# Patient Record
Sex: Female | Born: 1955
Health system: Southern US, Community
[De-identification: ages and names within clinical notes are randomized; demographics above are authoritative.]

## PROBLEM LIST (undated history)

## (undated) DIAGNOSIS — D649 Anemia, unspecified: Secondary | ICD-10-CM

## (undated) DIAGNOSIS — C50919 Malignant neoplasm of unspecified site of unspecified female breast: Secondary | ICD-10-CM

## (undated) DIAGNOSIS — R51 Headache: Secondary | ICD-10-CM

## (undated) DIAGNOSIS — Z8719 Personal history of other diseases of the digestive system: Secondary | ICD-10-CM

## (undated) DIAGNOSIS — G4733 Obstructive sleep apnea (adult) (pediatric): Secondary | ICD-10-CM

## (undated) DIAGNOSIS — D689 Coagulation defect, unspecified: Secondary | ICD-10-CM

## (undated) DIAGNOSIS — T8859XA Other complications of anesthesia, initial encounter: Secondary | ICD-10-CM

## (undated) DIAGNOSIS — E669 Obesity, unspecified: Secondary | ICD-10-CM

## (undated) DIAGNOSIS — Z9289 Personal history of other medical treatment: Secondary | ICD-10-CM

## (undated) DIAGNOSIS — IMO0001 Reserved for inherently not codable concepts without codable children: Secondary | ICD-10-CM

## (undated) DIAGNOSIS — F32A Depression, unspecified: Secondary | ICD-10-CM

## (undated) DIAGNOSIS — K222 Esophageal obstruction: Secondary | ICD-10-CM

## (undated) DIAGNOSIS — M545 Low back pain, unspecified: Secondary | ICD-10-CM

## (undated) DIAGNOSIS — K5790 Diverticulosis of intestine, part unspecified, without perforation or abscess without bleeding: Secondary | ICD-10-CM

## (undated) DIAGNOSIS — J42 Unspecified chronic bronchitis: Secondary | ICD-10-CM

## (undated) DIAGNOSIS — J189 Pneumonia, unspecified organism: Secondary | ICD-10-CM

## (undated) DIAGNOSIS — R519 Headache, unspecified: Secondary | ICD-10-CM

## (undated) DIAGNOSIS — F419 Anxiety disorder, unspecified: Secondary | ICD-10-CM

## (undated) DIAGNOSIS — Z923 Personal history of irradiation: Secondary | ICD-10-CM

## (undated) DIAGNOSIS — T4145XA Adverse effect of unspecified anesthetic, initial encounter: Secondary | ICD-10-CM

## (undated) DIAGNOSIS — K5792 Diverticulitis of intestine, part unspecified, without perforation or abscess without bleeding: Secondary | ICD-10-CM

## (undated) DIAGNOSIS — Z95820 Peripheral vascular angioplasty status with implants and grafts: Secondary | ICD-10-CM

## (undated) DIAGNOSIS — G8929 Other chronic pain: Secondary | ICD-10-CM

## (undated) DIAGNOSIS — F329 Major depressive disorder, single episode, unspecified: Secondary | ICD-10-CM

## (undated) DIAGNOSIS — J320 Chronic maxillary sinusitis: Secondary | ICD-10-CM

## (undated) DIAGNOSIS — K589 Irritable bowel syndrome without diarrhea: Secondary | ICD-10-CM

## (undated) DIAGNOSIS — M1990 Unspecified osteoarthritis, unspecified site: Secondary | ICD-10-CM

## (undated) DIAGNOSIS — R918 Other nonspecific abnormal finding of lung field: Secondary | ICD-10-CM

## (undated) DIAGNOSIS — K219 Gastro-esophageal reflux disease without esophagitis: Secondary | ICD-10-CM

## (undated) DIAGNOSIS — B4481 Allergic bronchopulmonary aspergillosis: Secondary | ICD-10-CM

## (undated) DIAGNOSIS — J449 Chronic obstructive pulmonary disease, unspecified: Secondary | ICD-10-CM

## (undated) DIAGNOSIS — I2510 Atherosclerotic heart disease of native coronary artery without angina pectoris: Secondary | ICD-10-CM

## (undated) DIAGNOSIS — R739 Hyperglycemia, unspecified: Secondary | ICD-10-CM

## (undated) DIAGNOSIS — E782 Mixed hyperlipidemia: Secondary | ICD-10-CM

## (undated) HISTORY — DX: Obstructive sleep apnea (adult) (pediatric): G47.33

## (undated) HISTORY — DX: Personal history of other medical treatment: Z92.89

## (undated) HISTORY — DX: Diverticulosis of intestine, part unspecified, without perforation or abscess without bleeding: K57.90

## (undated) HISTORY — DX: Diverticulitis of intestine, part unspecified, without perforation or abscess without bleeding: K57.92

## (undated) HISTORY — DX: Depression, unspecified: F32.A

## (undated) HISTORY — DX: Other nonspecific abnormal finding of lung field: R91.8

## (undated) HISTORY — DX: Irritable bowel syndrome, unspecified: K58.9

## (undated) HISTORY — DX: Malignant neoplasm of unspecified site of unspecified female breast: C50.919

## (undated) HISTORY — DX: Hyperglycemia, unspecified: R73.9

## (undated) HISTORY — DX: Atherosclerotic heart disease of native coronary artery without angina pectoris: I25.10

## (undated) HISTORY — DX: Obesity, unspecified: E66.9

## (undated) HISTORY — DX: Chronic obstructive pulmonary disease, unspecified: J44.9

## (undated) HISTORY — DX: Unspecified osteoarthritis, unspecified site: M19.90

## (undated) HISTORY — DX: Esophageal obstruction: K22.2

## (undated) HISTORY — DX: Coagulation defect, unspecified: D68.9

## (undated) HISTORY — DX: Chronic maxillary sinusitis: J32.0

## (undated) HISTORY — DX: Major depressive disorder, single episode, unspecified: F32.9

## (undated) HISTORY — DX: Mixed hyperlipidemia: E78.2

---

## 1987-06-21 HISTORY — PX: APPENDECTOMY: SHX54

## 2002-11-27 ENCOUNTER — Encounter: Payer: Self-pay | Admitting: Pediatrics

## 2002-11-27 ENCOUNTER — Encounter: Admission: RE | Admit: 2002-11-27 | Discharge: 2002-11-27 | Payer: Self-pay | Admitting: Pediatrics

## 2003-05-27 ENCOUNTER — Other Ambulatory Visit: Admission: RE | Admit: 2003-05-27 | Discharge: 2003-05-27 | Payer: Self-pay | Admitting: Obstetrics and Gynecology

## 2004-03-15 ENCOUNTER — Emergency Department (HOSPITAL_COMMUNITY): Admission: EM | Admit: 2004-03-15 | Discharge: 2004-03-15 | Payer: Self-pay | Admitting: Emergency Medicine

## 2004-03-19 ENCOUNTER — Encounter: Admission: RE | Admit: 2004-03-19 | Discharge: 2004-03-19 | Payer: Self-pay | Admitting: Family Medicine

## 2004-05-04 ENCOUNTER — Ambulatory Visit: Payer: Self-pay | Admitting: Family Medicine

## 2004-09-02 ENCOUNTER — Other Ambulatory Visit: Admission: RE | Admit: 2004-09-02 | Discharge: 2004-09-02 | Payer: Self-pay | Admitting: Obstetrics and Gynecology

## 2005-04-15 ENCOUNTER — Emergency Department (HOSPITAL_COMMUNITY): Admission: EM | Admit: 2005-04-15 | Discharge: 2005-04-16 | Payer: Self-pay | Admitting: Family Medicine

## 2005-04-19 ENCOUNTER — Ambulatory Visit: Payer: Self-pay | Admitting: Family Medicine

## 2005-04-27 ENCOUNTER — Encounter: Payer: Self-pay | Admitting: Internal Medicine

## 2005-04-27 ENCOUNTER — Ambulatory Visit: Payer: Self-pay | Admitting: Internal Medicine

## 2005-04-28 ENCOUNTER — Ambulatory Visit: Payer: Self-pay | Admitting: Family Medicine

## 2005-05-03 ENCOUNTER — Ambulatory Visit (HOSPITAL_COMMUNITY): Admission: RE | Admit: 2005-05-03 | Discharge: 2005-05-03 | Payer: Self-pay | Admitting: Critical Care Medicine

## 2005-07-27 ENCOUNTER — Ambulatory Visit: Payer: Self-pay | Admitting: Family Medicine

## 2005-09-14 ENCOUNTER — Ambulatory Visit: Payer: Self-pay | Admitting: Cardiology

## 2005-09-28 ENCOUNTER — Ambulatory Visit: Payer: Self-pay | Admitting: Critical Care Medicine

## 2005-10-14 ENCOUNTER — Ambulatory Visit: Payer: Self-pay | Admitting: Internal Medicine

## 2005-10-28 ENCOUNTER — Ambulatory Visit: Payer: Self-pay | Admitting: Critical Care Medicine

## 2005-10-31 ENCOUNTER — Ambulatory Visit: Payer: Self-pay | Admitting: Cardiology

## 2005-12-06 ENCOUNTER — Encounter: Payer: Self-pay | Admitting: Internal Medicine

## 2006-03-15 ENCOUNTER — Ambulatory Visit (HOSPITAL_BASED_OUTPATIENT_CLINIC_OR_DEPARTMENT_OTHER): Admission: RE | Admit: 2006-03-15 | Discharge: 2006-03-15 | Payer: Self-pay | Admitting: Otolaryngology

## 2006-03-15 ENCOUNTER — Encounter: Payer: Self-pay | Admitting: Internal Medicine

## 2006-03-21 ENCOUNTER — Ambulatory Visit: Payer: Self-pay | Admitting: Critical Care Medicine

## 2006-03-26 ENCOUNTER — Ambulatory Visit: Payer: Self-pay | Admitting: Internal Medicine

## 2006-05-13 ENCOUNTER — Ambulatory Visit: Payer: Self-pay | Admitting: Family Medicine

## 2006-05-29 ENCOUNTER — Ambulatory Visit: Payer: Self-pay | Admitting: Family Medicine

## 2006-06-20 DIAGNOSIS — B4481 Allergic bronchopulmonary aspergillosis: Secondary | ICD-10-CM

## 2006-06-20 HISTORY — DX: Allergic bronchopulmonary aspergillosis: B44.81

## 2006-08-21 ENCOUNTER — Ambulatory Visit: Payer: Self-pay | Admitting: Internal Medicine

## 2006-12-11 DIAGNOSIS — M899 Disorder of bone, unspecified: Secondary | ICD-10-CM | POA: Insufficient documentation

## 2006-12-11 DIAGNOSIS — F329 Major depressive disorder, single episode, unspecified: Secondary | ICD-10-CM

## 2006-12-11 DIAGNOSIS — M179 Osteoarthritis of knee, unspecified: Secondary | ICD-10-CM | POA: Insufficient documentation

## 2006-12-11 DIAGNOSIS — E249 Cushing's syndrome, unspecified: Secondary | ICD-10-CM

## 2006-12-11 DIAGNOSIS — M1990 Unspecified osteoarthritis, unspecified site: Secondary | ICD-10-CM

## 2006-12-11 DIAGNOSIS — F418 Other specified anxiety disorders: Secondary | ICD-10-CM | POA: Insufficient documentation

## 2006-12-11 DIAGNOSIS — M949 Disorder of cartilage, unspecified: Secondary | ICD-10-CM

## 2006-12-15 ENCOUNTER — Ambulatory Visit: Payer: Self-pay | Admitting: Family Medicine

## 2006-12-20 LAB — CONVERTED CEMR LAB
ALT: 15 units/L (ref 0–35)
AST: 17 units/L (ref 0–37)
Albumin: 3.7 g/dL (ref 3.5–5.2)
Alkaline Phosphatase: 80 units/L (ref 39–117)
BUN: 11 mg/dL (ref 6–23)
Basophils Absolute: 0.1 10*3/uL (ref 0.0–0.1)
Basophils Relative: 1.3 % — ABNORMAL HIGH (ref 0.0–1.0)
Bilirubin, Direct: 0.1 mg/dL (ref 0.0–0.3)
CO2: 30 meq/L (ref 19–32)
Calcium: 9.1 mg/dL (ref 8.4–10.5)
Chloride: 108 meq/L (ref 96–112)
Cholesterol: 250 mg/dL (ref 0–200)
Creatinine, Ser: 0.7 mg/dL (ref 0.4–1.2)
Direct LDL: 174.7 mg/dL
Eosinophils Absolute: 0.6 10*3/uL (ref 0.0–0.6)
Eosinophils Relative: 9.5 % — ABNORMAL HIGH (ref 0.0–5.0)
GFR calc Af Amer: 114 mL/min
GFR calc non Af Amer: 95 mL/min
Glucose, Bld: 84 mg/dL (ref 70–99)
HCT: 38.4 % (ref 36.0–46.0)
HDL: 48.4 mg/dL (ref >39.0)
Hemoglobin: 13.1 g/dL (ref 12.0–15.0)
Lymphocytes Relative: 20.5 % (ref 12.0–46.0)
MCHC: 34.1 g/dL (ref 30.0–36.0)
MCV: 89.7 fL (ref 78.0–100.0)
Monocytes Absolute: 0.4 10*3/uL (ref 0.2–0.7)
Monocytes Relative: 6.2 % (ref 3.0–11.0)
Neutro Abs: 4 10*3/uL (ref 1.4–7.7)
Neutrophils Relative %: 62.5 % (ref 43.0–77.0)
Platelets: 338 10*3/uL (ref 150–400)
Potassium: 4.1 meq/L (ref 3.5–5.1)
RBC: 4.29 M/uL (ref 3.87–5.11)
RDW: 13 % (ref 11.5–14.6)
Sodium: 140 meq/L (ref 135–145)
TSH: 1.78 microintl units/mL (ref 0.35–5.50)
Total Bilirubin: 0.7 mg/dL (ref 0.3–1.2)
Total CHOL/HDL Ratio: 5.2
Total Protein: 6.8 g/dL (ref 6.0–8.3)
Triglycerides: 105 mg/dL (ref 0–149)
VLDL: 21 mg/dL (ref 0–40)
WBC: 6.4 10*3/uL (ref 4.5–10.5)

## 2007-01-15 ENCOUNTER — Encounter: Payer: Self-pay | Admitting: Family Medicine

## 2007-04-04 ENCOUNTER — Encounter: Payer: Self-pay | Admitting: Family Medicine

## 2007-05-08 ENCOUNTER — Encounter: Payer: Self-pay | Admitting: Family Medicine

## 2007-05-10 ENCOUNTER — Telehealth (INDEPENDENT_AMBULATORY_CARE_PROVIDER_SITE_OTHER): Payer: Self-pay | Admitting: *Deleted

## 2007-05-10 ENCOUNTER — Ambulatory Visit: Payer: Self-pay | Admitting: Family Medicine

## 2007-05-10 DIAGNOSIS — L0390 Cellulitis, unspecified: Secondary | ICD-10-CM

## 2007-05-10 DIAGNOSIS — L0291 Cutaneous abscess, unspecified: Secondary | ICD-10-CM | POA: Insufficient documentation

## 2007-05-13 ENCOUNTER — Encounter: Payer: Self-pay | Admitting: Family Medicine

## 2007-05-14 ENCOUNTER — Ambulatory Visit: Payer: Self-pay | Admitting: Family Medicine

## 2007-05-16 ENCOUNTER — Telehealth (INDEPENDENT_AMBULATORY_CARE_PROVIDER_SITE_OTHER): Payer: Self-pay | Admitting: *Deleted

## 2007-05-18 ENCOUNTER — Ambulatory Visit: Payer: Self-pay | Admitting: Family Medicine

## 2007-06-06 ENCOUNTER — Ambulatory Visit: Payer: Self-pay | Admitting: Family Medicine

## 2007-07-20 ENCOUNTER — Encounter: Payer: Self-pay | Admitting: Family Medicine

## 2007-07-25 ENCOUNTER — Ambulatory Visit: Payer: Self-pay | Admitting: Internal Medicine

## 2007-09-03 ENCOUNTER — Encounter: Payer: Self-pay | Admitting: Family Medicine

## 2007-09-11 ENCOUNTER — Ambulatory Visit: Payer: Self-pay | Admitting: Family Medicine

## 2007-09-11 DIAGNOSIS — N39498 Other specified urinary incontinence: Secondary | ICD-10-CM | POA: Insufficient documentation

## 2007-09-11 DIAGNOSIS — E782 Mixed hyperlipidemia: Secondary | ICD-10-CM

## 2007-09-11 HISTORY — DX: Mixed hyperlipidemia: E78.2

## 2007-09-21 ENCOUNTER — Ambulatory Visit: Payer: Self-pay | Admitting: Family Medicine

## 2007-09-25 LAB — CONVERTED CEMR LAB
ALT: 24 units/L (ref 0–35)
AST: 21 units/L (ref 0–37)
Albumin: 3.4 g/dL — ABNORMAL LOW (ref 3.5–5.2)
Alkaline Phosphatase: 78 units/L (ref 39–117)
BUN: 9 mg/dL (ref 6–23)
Basophils Absolute: 0.1 10*3/uL (ref 0.0–0.1)
Basophils Relative: 1.2 % — ABNORMAL HIGH (ref 0.0–1.0)
Bilirubin, Direct: 0.1 mg/dL (ref 0.0–0.3)
CO2: 30 meq/L (ref 19–32)
Calcium: 8.9 mg/dL (ref 8.4–10.5)
Chloride: 107 meq/L (ref 96–112)
Cholesterol: 215 mg/dL (ref 0–200)
Creatinine, Ser: 0.7 mg/dL (ref 0.4–1.2)
Direct LDL: 160.1 mg/dL
Eosinophils Absolute: 0.7 10*3/uL (ref 0.0–0.7)
Eosinophils Relative: 12.5 % — ABNORMAL HIGH (ref 0.0–5.0)
GFR calc Af Amer: 114 mL/min
GFR calc non Af Amer: 94 mL/min
Glucose, Bld: 86 mg/dL (ref 70–99)
HCT: 39.8 % (ref 36.0–46.0)
HDL: 42.6 mg/dL (ref >39.0)
Hemoglobin: 13 g/dL (ref 12.0–15.0)
Lymphocytes Relative: 29.7 % (ref 12.0–46.0)
MCHC: 32.6 g/dL (ref 30.0–36.0)
MCV: 91.3 fL (ref 78.0–100.0)
Monocytes Absolute: 0.4 10*3/uL (ref 0.1–1.0)
Monocytes Relative: 7.4 % (ref 3.0–12.0)
Neutro Abs: 2.6 10*3/uL (ref 1.4–7.7)
Neutrophils Relative %: 49.2 % (ref 43.0–77.0)
Platelets: 342 10*3/uL (ref 150–400)
Potassium: 3.8 meq/L (ref 3.5–5.1)
RBC: 4.36 M/uL (ref 3.87–5.11)
RDW: 13 % (ref 11.5–14.6)
Sodium: 141 meq/L (ref 135–145)
TSH: 1.86 microintl units/mL (ref 0.35–5.50)
Total Bilirubin: 0.7 mg/dL (ref 0.3–1.2)
Total CHOL/HDL Ratio: 5
Total Protein: 6.8 g/dL (ref 6.0–8.3)
Triglycerides: 129 mg/dL (ref 0–149)
VLDL: 26 mg/dL (ref 0–40)
WBC: 5.4 10*3/uL (ref 4.5–10.5)

## 2007-09-26 ENCOUNTER — Encounter (INDEPENDENT_AMBULATORY_CARE_PROVIDER_SITE_OTHER): Payer: Self-pay | Admitting: *Deleted

## 2007-10-24 ENCOUNTER — Encounter: Payer: Self-pay | Admitting: Family Medicine

## 2007-10-31 ENCOUNTER — Encounter: Payer: Self-pay | Admitting: Family Medicine

## 2007-11-16 ENCOUNTER — Ambulatory Visit: Payer: Self-pay | Admitting: Internal Medicine

## 2007-11-16 DIAGNOSIS — J45909 Unspecified asthma, uncomplicated: Secondary | ICD-10-CM | POA: Insufficient documentation

## 2007-11-21 ENCOUNTER — Telehealth (INDEPENDENT_AMBULATORY_CARE_PROVIDER_SITE_OTHER): Payer: Self-pay | Admitting: *Deleted

## 2007-11-21 ENCOUNTER — Ambulatory Visit: Payer: Self-pay | Admitting: Internal Medicine

## 2007-11-21 LAB — CONVERTED CEMR LAB
Basophils Absolute: 0.1 10*3/uL (ref 0.0–0.1)
Basophils Relative: 1 % (ref 0.0–1.0)
Eosinophils Absolute: 0.6 10*3/uL (ref 0.0–0.7)
Eosinophils Relative: 10.4 % — ABNORMAL HIGH (ref 0.0–5.0)
HCT: 37.1 % (ref 36.0–46.0)
Hemoglobin: 12.9 g/dL (ref 12.0–15.0)
IgE (Immunoglobulin E), Serum: 604.9 intl units/mL — ABNORMAL HIGH (ref 0.0–180.0)
Lymphocytes Relative: 29.5 % (ref 12.0–46.0)
MCHC: 34.8 g/dL (ref 30.0–36.0)
MCV: 89.1 fL (ref 78.0–100.0)
Monocytes Absolute: 0.5 10*3/uL (ref 0.1–1.0)
Monocytes Relative: 8.6 % (ref 3.0–12.0)
Neutro Abs: 2.7 10*3/uL (ref 1.4–7.7)
Neutrophils Relative %: 50.5 % (ref 43.0–77.0)
Platelets: 293 10*3/uL (ref 150–400)
RBC: 4.17 M/uL (ref 3.87–5.11)
RDW: 13 % (ref 11.5–14.6)
WBC: 5.6 10*3/uL (ref 4.5–10.5)

## 2007-11-22 ENCOUNTER — Encounter: Payer: Self-pay | Admitting: Internal Medicine

## 2007-12-04 ENCOUNTER — Ambulatory Visit: Payer: Self-pay | Admitting: Internal Medicine

## 2007-12-14 ENCOUNTER — Ambulatory Visit: Payer: Self-pay | Admitting: Internal Medicine

## 2007-12-14 DIAGNOSIS — B4481 Allergic bronchopulmonary aspergillosis: Secondary | ICD-10-CM

## 2007-12-24 ENCOUNTER — Telehealth (INDEPENDENT_AMBULATORY_CARE_PROVIDER_SITE_OTHER): Payer: Self-pay | Admitting: *Deleted

## 2008-01-02 ENCOUNTER — Telehealth (INDEPENDENT_AMBULATORY_CARE_PROVIDER_SITE_OTHER): Payer: Self-pay | Admitting: *Deleted

## 2008-01-11 ENCOUNTER — Ambulatory Visit: Payer: Self-pay | Admitting: Pulmonary Disease

## 2008-02-01 ENCOUNTER — Ambulatory Visit: Payer: Self-pay | Admitting: Internal Medicine

## 2008-04-15 ENCOUNTER — Ambulatory Visit: Payer: Self-pay | Admitting: Family Medicine

## 2008-04-15 DIAGNOSIS — M25559 Pain in unspecified hip: Secondary | ICD-10-CM

## 2008-04-15 DIAGNOSIS — M25569 Pain in unspecified knee: Secondary | ICD-10-CM

## 2008-04-15 DIAGNOSIS — M2240 Chondromalacia patellae, unspecified knee: Secondary | ICD-10-CM | POA: Insufficient documentation

## 2008-04-21 ENCOUNTER — Encounter: Payer: Self-pay | Admitting: Internal Medicine

## 2008-04-23 ENCOUNTER — Telehealth: Payer: Self-pay | Admitting: Internal Medicine

## 2008-04-24 ENCOUNTER — Encounter: Payer: Self-pay | Admitting: Internal Medicine

## 2008-05-12 ENCOUNTER — Ambulatory Visit: Payer: Self-pay | Admitting: Internal Medicine

## 2008-05-12 LAB — CONVERTED CEMR LAB
ALT: 23 units/L (ref 0–35)
AST: 21 units/L (ref 0–37)
Albumin: 3.7 g/dL (ref 3.5–5.2)
Alkaline Phosphatase: 84 units/L (ref 39–117)
BUN: 13 mg/dL (ref 6–23)
Bilirubin, Direct: 0.1 mg/dL (ref 0.0–0.3)
CO2: 31 meq/L (ref 19–32)
Calcium: 9 mg/dL (ref 8.4–10.5)
Chloride: 102 meq/L (ref 96–112)
Creatinine, Ser: 0.7 mg/dL (ref 0.4–1.2)
GFR calc Af Amer: 113 mL/min
GFR calc non Af Amer: 94 mL/min
Glucose, Bld: 86 mg/dL (ref 70–99)
Potassium: 3.9 meq/L (ref 3.5–5.1)
Sodium: 139 meq/L (ref 135–145)
Total Bilirubin: 0.6 mg/dL (ref 0.3–1.2)
Total Protein: 6.9 g/dL (ref 6.0–8.3)

## 2008-05-16 ENCOUNTER — Telehealth: Payer: Self-pay | Admitting: Internal Medicine

## 2008-05-28 ENCOUNTER — Telehealth: Payer: Self-pay | Admitting: Internal Medicine

## 2008-05-30 ENCOUNTER — Encounter: Payer: Self-pay | Admitting: Internal Medicine

## 2008-06-23 ENCOUNTER — Telehealth (INDEPENDENT_AMBULATORY_CARE_PROVIDER_SITE_OTHER): Payer: Self-pay | Admitting: *Deleted

## 2008-06-24 ENCOUNTER — Ambulatory Visit: Payer: Self-pay | Admitting: Family Medicine

## 2008-06-27 ENCOUNTER — Ambulatory Visit: Payer: Self-pay | Admitting: Internal Medicine

## 2008-06-27 LAB — CONVERTED CEMR LAB
BUN: 14 mg/dL (ref 6–23)
Basophils Absolute: 0 10*3/uL (ref 0.0–0.1)
Basophils Relative: 0.2 % (ref 0.0–3.0)
CO2: 31 meq/L (ref 19–32)
Calcium: 9 mg/dL (ref 8.4–10.5)
Chloride: 101 meq/L (ref 96–112)
Creatinine, Ser: 0.7 mg/dL (ref 0.4–1.2)
Eosinophils Absolute: 0.5 10*3/uL (ref 0.0–0.7)
Eosinophils Relative: 5.3 % — ABNORMAL HIGH (ref 0.0–5.0)
GFR calc Af Amer: 113 mL/min
GFR calc non Af Amer: 94 mL/min
Glucose, Bld: 101 mg/dL — ABNORMAL HIGH (ref 70–99)
HCT: 38 % (ref 36.0–46.0)
Hemoglobin: 12.8 g/dL (ref 12.0–15.0)
Lymphocytes Relative: 21 % (ref 12.0–46.0)
MCHC: 33.7 g/dL (ref 30.0–36.0)
MCV: 90.5 fL (ref 78.0–100.0)
Monocytes Absolute: 0.6 10*3/uL (ref 0.1–1.0)
Monocytes Relative: 7.2 % (ref 3.0–12.0)
Neutro Abs: 5.9 10*3/uL (ref 1.4–7.7)
Neutrophils Relative %: 66.3 % (ref 43.0–77.0)
Platelets: 315 10*3/uL (ref 150–400)
Potassium: 4 meq/L (ref 3.5–5.1)
RBC: 4.19 M/uL (ref 3.87–5.11)
RDW: 13.1 % (ref 11.5–14.6)
Sodium: 139 meq/L (ref 135–145)
WBC: 8.9 10*3/uL (ref 4.5–10.5)

## 2008-08-01 ENCOUNTER — Ambulatory Visit: Payer: Self-pay | Admitting: Internal Medicine

## 2008-08-05 ENCOUNTER — Telehealth (INDEPENDENT_AMBULATORY_CARE_PROVIDER_SITE_OTHER): Payer: Self-pay | Admitting: *Deleted

## 2008-08-06 ENCOUNTER — Telehealth (INDEPENDENT_AMBULATORY_CARE_PROVIDER_SITE_OTHER): Payer: Self-pay | Admitting: *Deleted

## 2008-08-06 ENCOUNTER — Telehealth: Payer: Self-pay | Admitting: Internal Medicine

## 2008-08-16 ENCOUNTER — Telehealth: Payer: Self-pay | Admitting: Family Medicine

## 2008-08-16 ENCOUNTER — Emergency Department (HOSPITAL_COMMUNITY): Admission: EM | Admit: 2008-08-16 | Discharge: 2008-08-16 | Payer: Self-pay | Admitting: Emergency Medicine

## 2008-08-17 ENCOUNTER — Ambulatory Visit: Payer: Self-pay | Admitting: *Deleted

## 2008-08-17 ENCOUNTER — Encounter: Payer: Self-pay | Admitting: Family Medicine

## 2008-08-17 ENCOUNTER — Encounter (INDEPENDENT_AMBULATORY_CARE_PROVIDER_SITE_OTHER): Payer: Self-pay | Admitting: Emergency Medicine

## 2008-08-17 ENCOUNTER — Ambulatory Visit (HOSPITAL_COMMUNITY): Admission: RE | Admit: 2008-08-17 | Discharge: 2008-08-17 | Payer: Self-pay | Admitting: Emergency Medicine

## 2008-08-19 ENCOUNTER — Ambulatory Visit: Payer: Self-pay | Admitting: Family Medicine

## 2008-08-19 DIAGNOSIS — R002 Palpitations: Secondary | ICD-10-CM | POA: Insufficient documentation

## 2008-08-21 ENCOUNTER — Encounter (INDEPENDENT_AMBULATORY_CARE_PROVIDER_SITE_OTHER): Payer: Self-pay | Admitting: *Deleted

## 2008-08-22 ENCOUNTER — Telehealth: Payer: Self-pay | Admitting: Family Medicine

## 2008-08-27 ENCOUNTER — Telehealth: Payer: Self-pay | Admitting: Family Medicine

## 2008-08-28 ENCOUNTER — Encounter: Payer: Self-pay | Admitting: Internal Medicine

## 2008-09-22 ENCOUNTER — Telehealth: Payer: Self-pay | Admitting: Internal Medicine

## 2008-10-07 ENCOUNTER — Ambulatory Visit: Payer: Self-pay | Admitting: Internal Medicine

## 2008-10-16 ENCOUNTER — Telehealth: Payer: Self-pay | Admitting: Internal Medicine

## 2008-10-21 ENCOUNTER — Inpatient Hospital Stay (HOSPITAL_COMMUNITY): Admission: EM | Admit: 2008-10-21 | Discharge: 2008-10-22 | Payer: Self-pay | Admitting: Emergency Medicine

## 2008-10-21 ENCOUNTER — Ambulatory Visit: Payer: Self-pay | Admitting: Internal Medicine

## 2008-10-21 ENCOUNTER — Encounter: Payer: Self-pay | Admitting: Internal Medicine

## 2008-10-21 DIAGNOSIS — K219 Gastro-esophageal reflux disease without esophagitis: Secondary | ICD-10-CM | POA: Insufficient documentation

## 2008-10-22 ENCOUNTER — Encounter: Payer: Self-pay | Admitting: Family Medicine

## 2008-10-22 ENCOUNTER — Ambulatory Visit: Payer: Self-pay

## 2008-10-22 ENCOUNTER — Ambulatory Visit: Payer: Self-pay | Admitting: Internal Medicine

## 2008-10-22 ENCOUNTER — Encounter: Payer: Self-pay | Admitting: Cardiology

## 2008-11-04 ENCOUNTER — Encounter: Payer: Self-pay | Admitting: Family Medicine

## 2008-11-11 ENCOUNTER — Telehealth (INDEPENDENT_AMBULATORY_CARE_PROVIDER_SITE_OTHER): Payer: Self-pay | Admitting: *Deleted

## 2008-11-20 ENCOUNTER — Telehealth: Payer: Self-pay | Admitting: Internal Medicine

## 2008-11-25 ENCOUNTER — Ambulatory Visit: Payer: Self-pay | Admitting: Internal Medicine

## 2008-12-18 ENCOUNTER — Telehealth (INDEPENDENT_AMBULATORY_CARE_PROVIDER_SITE_OTHER): Payer: Self-pay | Admitting: *Deleted

## 2008-12-25 ENCOUNTER — Encounter: Payer: Self-pay | Admitting: Internal Medicine

## 2009-01-01 ENCOUNTER — Telehealth (INDEPENDENT_AMBULATORY_CARE_PROVIDER_SITE_OTHER): Payer: Self-pay | Admitting: *Deleted

## 2009-01-08 ENCOUNTER — Ambulatory Visit: Payer: Self-pay | Admitting: Internal Medicine

## 2009-03-12 ENCOUNTER — Ambulatory Visit: Payer: Self-pay | Admitting: Family Medicine

## 2009-03-12 ENCOUNTER — Telehealth (INDEPENDENT_AMBULATORY_CARE_PROVIDER_SITE_OTHER): Payer: Self-pay | Admitting: *Deleted

## 2009-04-02 ENCOUNTER — Ambulatory Visit: Payer: Self-pay | Admitting: Family Medicine

## 2009-05-22 ENCOUNTER — Ambulatory Visit: Payer: Self-pay | Admitting: Family

## 2009-05-25 ENCOUNTER — Telehealth (INDEPENDENT_AMBULATORY_CARE_PROVIDER_SITE_OTHER): Payer: Self-pay | Admitting: *Deleted

## 2009-06-16 ENCOUNTER — Telehealth (INDEPENDENT_AMBULATORY_CARE_PROVIDER_SITE_OTHER): Payer: Self-pay | Admitting: *Deleted

## 2009-06-16 ENCOUNTER — Ambulatory Visit: Payer: Self-pay | Admitting: Internal Medicine

## 2009-07-14 ENCOUNTER — Telehealth (INDEPENDENT_AMBULATORY_CARE_PROVIDER_SITE_OTHER): Payer: Self-pay | Admitting: *Deleted

## 2009-07-14 ENCOUNTER — Ambulatory Visit: Payer: Self-pay | Admitting: Internal Medicine

## 2009-07-14 DIAGNOSIS — J45901 Unspecified asthma with (acute) exacerbation: Secondary | ICD-10-CM | POA: Insufficient documentation

## 2009-07-27 ENCOUNTER — Encounter: Payer: Self-pay | Admitting: Internal Medicine

## 2009-07-28 ENCOUNTER — Encounter: Payer: Self-pay | Admitting: Family Medicine

## 2009-07-29 ENCOUNTER — Ambulatory Visit: Payer: Self-pay | Admitting: Diagnostic Radiology

## 2009-07-29 ENCOUNTER — Emergency Department (HOSPITAL_BASED_OUTPATIENT_CLINIC_OR_DEPARTMENT_OTHER): Admission: EM | Admit: 2009-07-29 | Discharge: 2009-07-29 | Payer: Self-pay | Admitting: Emergency Medicine

## 2009-07-29 ENCOUNTER — Encounter: Payer: Self-pay | Admitting: Family Medicine

## 2009-07-31 ENCOUNTER — Telehealth: Payer: Self-pay | Admitting: Family Medicine

## 2009-07-31 ENCOUNTER — Emergency Department (HOSPITAL_BASED_OUTPATIENT_CLINIC_OR_DEPARTMENT_OTHER): Admission: EM | Admit: 2009-07-31 | Discharge: 2009-07-31 | Payer: Self-pay | Admitting: Emergency Medicine

## 2009-08-03 ENCOUNTER — Ambulatory Visit: Payer: Self-pay | Admitting: Family Medicine

## 2009-08-03 DIAGNOSIS — R030 Elevated blood-pressure reading, without diagnosis of hypertension: Secondary | ICD-10-CM | POA: Insufficient documentation

## 2009-08-03 DIAGNOSIS — R519 Headache, unspecified: Secondary | ICD-10-CM | POA: Insufficient documentation

## 2009-08-03 DIAGNOSIS — R51 Headache: Secondary | ICD-10-CM

## 2009-08-04 ENCOUNTER — Encounter: Payer: Self-pay | Admitting: Family Medicine

## 2009-08-10 ENCOUNTER — Encounter: Admission: RE | Admit: 2009-08-10 | Discharge: 2009-08-10 | Payer: Self-pay | Admitting: Family Medicine

## 2009-08-11 ENCOUNTER — Ambulatory Visit: Payer: Self-pay | Admitting: Internal Medicine

## 2009-08-14 ENCOUNTER — Encounter: Payer: Self-pay | Admitting: Family Medicine

## 2009-08-20 ENCOUNTER — Ambulatory Visit: Payer: Self-pay | Admitting: Radiology

## 2009-08-20 ENCOUNTER — Ambulatory Visit (HOSPITAL_BASED_OUTPATIENT_CLINIC_OR_DEPARTMENT_OTHER): Admission: RE | Admit: 2009-08-20 | Discharge: 2009-08-20 | Payer: Self-pay | Admitting: Family Medicine

## 2009-08-20 ENCOUNTER — Ambulatory Visit: Payer: Self-pay | Admitting: Family Medicine

## 2009-08-20 DIAGNOSIS — S139XXA Sprain of joints and ligaments of unspecified parts of neck, initial encounter: Secondary | ICD-10-CM

## 2009-08-20 DIAGNOSIS — R209 Unspecified disturbances of skin sensation: Secondary | ICD-10-CM

## 2009-08-25 ENCOUNTER — Encounter: Payer: Self-pay | Admitting: Internal Medicine

## 2009-09-03 ENCOUNTER — Telehealth: Payer: Self-pay | Admitting: Internal Medicine

## 2009-09-18 ENCOUNTER — Telehealth: Payer: Self-pay | Admitting: Internal Medicine

## 2009-10-07 ENCOUNTER — Telehealth (INDEPENDENT_AMBULATORY_CARE_PROVIDER_SITE_OTHER): Payer: Self-pay | Admitting: *Deleted

## 2009-10-28 ENCOUNTER — Ambulatory Visit: Payer: Self-pay | Admitting: Internal Medicine

## 2009-11-05 ENCOUNTER — Telehealth: Payer: Self-pay | Admitting: Family Medicine

## 2009-11-10 ENCOUNTER — Encounter: Payer: Self-pay | Admitting: Internal Medicine

## 2009-12-02 ENCOUNTER — Telehealth (INDEPENDENT_AMBULATORY_CARE_PROVIDER_SITE_OTHER): Payer: Self-pay | Admitting: *Deleted

## 2009-12-03 ENCOUNTER — Ambulatory Visit: Payer: Self-pay | Admitting: Family Medicine

## 2009-12-04 ENCOUNTER — Telehealth (INDEPENDENT_AMBULATORY_CARE_PROVIDER_SITE_OTHER): Payer: Self-pay | Admitting: *Deleted

## 2009-12-04 ENCOUNTER — Encounter (INDEPENDENT_AMBULATORY_CARE_PROVIDER_SITE_OTHER): Payer: Self-pay | Admitting: *Deleted

## 2009-12-24 ENCOUNTER — Encounter: Payer: Self-pay | Admitting: Family Medicine

## 2010-01-04 ENCOUNTER — Telehealth: Payer: Self-pay | Admitting: Family Medicine

## 2010-01-06 ENCOUNTER — Encounter: Payer: Self-pay | Admitting: Internal Medicine

## 2010-01-15 ENCOUNTER — Ambulatory Visit: Payer: Self-pay | Admitting: Family Medicine

## 2010-01-15 ENCOUNTER — Ambulatory Visit: Payer: Self-pay | Admitting: Cardiology

## 2010-01-15 DIAGNOSIS — R109 Unspecified abdominal pain: Secondary | ICD-10-CM | POA: Insufficient documentation

## 2010-01-18 ENCOUNTER — Encounter (INDEPENDENT_AMBULATORY_CARE_PROVIDER_SITE_OTHER): Payer: Self-pay | Admitting: *Deleted

## 2010-01-19 ENCOUNTER — Ambulatory Visit: Payer: Self-pay | Admitting: Cardiology

## 2010-01-21 ENCOUNTER — Telehealth (INDEPENDENT_AMBULATORY_CARE_PROVIDER_SITE_OTHER): Payer: Self-pay | Admitting: *Deleted

## 2010-01-27 ENCOUNTER — Telehealth: Payer: Self-pay | Admitting: Internal Medicine

## 2010-03-09 ENCOUNTER — Telehealth (INDEPENDENT_AMBULATORY_CARE_PROVIDER_SITE_OTHER): Payer: Self-pay | Admitting: *Deleted

## 2010-03-19 ENCOUNTER — Encounter: Payer: Self-pay | Admitting: Internal Medicine

## 2010-03-19 ENCOUNTER — Ambulatory Visit: Payer: Self-pay | Admitting: Internal Medicine

## 2010-04-10 ENCOUNTER — Telehealth: Payer: Self-pay | Admitting: Internal Medicine

## 2010-04-30 ENCOUNTER — Ambulatory Visit: Payer: Self-pay | Admitting: Internal Medicine

## 2010-05-20 ENCOUNTER — Ambulatory Visit: Payer: Self-pay | Admitting: Family Medicine

## 2010-05-24 ENCOUNTER — Telehealth: Payer: Self-pay | Admitting: Family Medicine

## 2010-05-24 LAB — CONVERTED CEMR LAB
ALT: 75 units/L — ABNORMAL HIGH (ref 0–35)
AST: 36 units/L (ref 0–37)
Albumin: 3.9 g/dL (ref 3.5–5.2)
Alkaline Phosphatase: 147 units/L — ABNORMAL HIGH (ref 39–117)
Bilirubin, Direct: 0.1 mg/dL (ref 0.0–0.3)
Cholesterol: 259 mg/dL — ABNORMAL HIGH (ref 0–200)
Direct LDL: 177.6 mg/dL
HDL: 49.3 mg/dL (ref >39.00)
Total Bilirubin: 0.9 mg/dL (ref 0.3–1.2)
Total CHOL/HDL Ratio: 5
Total Protein: 6.5 g/dL (ref 6.0–8.3)
Triglycerides: 130 mg/dL (ref 0.0–149.0)
VLDL: 26 mg/dL (ref 0.0–40.0)

## 2010-05-25 ENCOUNTER — Encounter: Payer: Self-pay | Admitting: Internal Medicine

## 2010-05-27 ENCOUNTER — Encounter: Payer: Self-pay | Admitting: Family Medicine

## 2010-05-27 ENCOUNTER — Ambulatory Visit: Payer: Self-pay | Admitting: Family Medicine

## 2010-05-27 DIAGNOSIS — R740 Nonspecific elevation of levels of transaminase and lactic acid dehydrogenase [LDH]: Secondary | ICD-10-CM

## 2010-05-27 LAB — CONVERTED CEMR LAB
ALT: 51 units/L — ABNORMAL HIGH (ref 0–35)
AST: 34 units/L (ref 0–37)
Albumin: 4.1 g/dL (ref 3.5–5.2)
Alkaline Phosphatase: 131 units/L — ABNORMAL HIGH (ref 39–117)
Bilirubin, Direct: 0.1 mg/dL (ref 0.0–0.3)
GGT: 189 units/L — ABNORMAL HIGH (ref 7–51)
Total Bilirubin: 0.3 mg/dL (ref 0.3–1.2)
Total Protein: 6.8 g/dL (ref 6.0–8.3)

## 2010-05-28 ENCOUNTER — Telehealth (INDEPENDENT_AMBULATORY_CARE_PROVIDER_SITE_OTHER): Payer: Self-pay | Admitting: *Deleted

## 2010-05-31 ENCOUNTER — Telehealth: Payer: Self-pay | Admitting: Internal Medicine

## 2010-05-31 ENCOUNTER — Telehealth (INDEPENDENT_AMBULATORY_CARE_PROVIDER_SITE_OTHER): Payer: Self-pay | Admitting: *Deleted

## 2010-06-08 ENCOUNTER — Ambulatory Visit: Payer: Self-pay | Admitting: Internal Medicine

## 2010-06-11 DIAGNOSIS — K7689 Other specified diseases of liver: Secondary | ICD-10-CM

## 2010-06-11 DIAGNOSIS — K5730 Diverticulosis of large intestine without perforation or abscess without bleeding: Secondary | ICD-10-CM | POA: Insufficient documentation

## 2010-06-11 DIAGNOSIS — M4120 Other idiopathic scoliosis, site unspecified: Secondary | ICD-10-CM | POA: Insufficient documentation

## 2010-06-11 DIAGNOSIS — K429 Umbilical hernia without obstruction or gangrene: Secondary | ICD-10-CM | POA: Insufficient documentation

## 2010-06-15 ENCOUNTER — Emergency Department (HOSPITAL_BASED_OUTPATIENT_CLINIC_OR_DEPARTMENT_OTHER)
Admission: EM | Admit: 2010-06-15 | Discharge: 2010-06-15 | Payer: Self-pay | Source: Home / Self Care | Admitting: Emergency Medicine

## 2010-06-16 ENCOUNTER — Ambulatory Visit
Admission: RE | Admit: 2010-06-16 | Discharge: 2010-06-16 | Payer: Self-pay | Source: Home / Self Care | Attending: Family Medicine | Admitting: Family Medicine

## 2010-06-16 LAB — CONVERTED CEMR LAB: Creatinine, Ser: 0.8 mg/dL (ref 0.4–1.2)

## 2010-06-17 ENCOUNTER — Telehealth: Payer: Self-pay | Admitting: Family Medicine

## 2010-06-22 ENCOUNTER — Ambulatory Visit: Admit: 2010-06-22 | Payer: Self-pay | Admitting: Internal Medicine

## 2010-06-23 ENCOUNTER — Telehealth: Payer: Self-pay | Admitting: Family Medicine

## 2010-06-25 ENCOUNTER — Telehealth (INDEPENDENT_AMBULATORY_CARE_PROVIDER_SITE_OTHER): Payer: Self-pay | Admitting: *Deleted

## 2010-06-25 ENCOUNTER — Encounter: Payer: Self-pay | Admitting: Family Medicine

## 2010-06-27 ENCOUNTER — Encounter
Admission: RE | Admit: 2010-06-27 | Discharge: 2010-06-27 | Payer: Self-pay | Source: Home / Self Care | Attending: Family Medicine | Admitting: Family Medicine

## 2010-06-29 ENCOUNTER — Telehealth (INDEPENDENT_AMBULATORY_CARE_PROVIDER_SITE_OTHER): Payer: Self-pay | Admitting: *Deleted

## 2010-06-29 ENCOUNTER — Encounter: Payer: Self-pay | Admitting: Family Medicine

## 2010-07-18 LAB — CONVERTED CEMR LAB
ALT: 18 units/L (ref 0–35)
ALT: 21 units/L (ref 0–35)
AST: 18 units/L (ref 0–37)
AST: 21 units/L (ref 0–37)
Albumin: 3.6 g/dL (ref 3.5–5.2)
Albumin: 4 g/dL (ref 3.5–5.2)
Alkaline Phosphatase: 80 units/L (ref 39–117)
Alkaline Phosphatase: 90 units/L (ref 39–117)
BUN: 13 mg/dL (ref 6–23)
BUN: 15 mg/dL (ref 6–23)
Basophils Absolute: 0 10*3/uL (ref 0.0–0.1)
Basophils Absolute: 0 10*3/uL (ref 0.0–0.1)
Basophils Relative: 0.5 % (ref 0.0–3.0)
Basophils Relative: 0.8 % (ref 0.0–3.0)
Bilirubin Urine: NEGATIVE
Bilirubin Urine: NEGATIVE
Bilirubin, Direct: 0.1 mg/dL (ref 0.0–0.3)
Bilirubin, Direct: 0.2 mg/dL (ref 0.0–0.3)
Blood in Urine, dipstick: NEGATIVE
Blood in Urine, dipstick: NEGATIVE
CO2: 29 meq/L (ref 19–32)
CO2: 30 meq/L (ref 19–32)
Calcium: 9.1 mg/dL (ref 8.4–10.5)
Calcium: 9.1 mg/dL (ref 8.4–10.5)
Chloride: 105 meq/L (ref 96–112)
Chloride: 105 meq/L (ref 96–112)
Cholesterol: 225 mg/dL (ref 0–200)
Cholesterol: 264 mg/dL — ABNORMAL HIGH (ref 0–200)
Creatinine, Ser: 0.7 mg/dL (ref 0.4–1.2)
Creatinine, Ser: 0.8 mg/dL (ref 0.4–1.2)
Direct LDL: 138.9 mg/dL
Direct LDL: 176 mg/dL
Eosinophils Absolute: 0.4 10*3/uL (ref 0.0–0.7)
Eosinophils Absolute: 0.6 10*3/uL (ref 0.0–0.7)
Eosinophils Relative: 7.1 % — ABNORMAL HIGH (ref 0.0–5.0)
Eosinophils Relative: 8.9 % — ABNORMAL HIGH (ref 0.0–5.0)
GFR calc Af Amer: 113 mL/min
GFR calc non Af Amer: 83.48 mL/min (ref >60)
GFR calc non Af Amer: 94 mL/min
Glucose, Bld: 79 mg/dL (ref 70–99)
Glucose, Bld: 90 mg/dL (ref 70–99)
Glucose, Urine, Semiquant: NEGATIVE
Glucose, Urine, Semiquant: NEGATIVE
HCT: 38.7 % (ref 36.0–46.0)
HCT: 41 % (ref 36.0–46.0)
HDL: 55.1 mg/dL (ref >39.00)
HDL: 55.3 mg/dL (ref >39.0)
Hemoglobin: 13.1 g/dL (ref 12.0–15.0)
Hemoglobin: 13.7 g/dL (ref 12.0–15.0)
Ketones, urine, test strip: NEGATIVE
Ketones, urine, test strip: NEGATIVE
Lymphocytes Relative: 23.6 % (ref 12.0–46.0)
Lymphocytes Relative: 25.5 % (ref 12.0–46.0)
Lymphs Abs: 1.6 10*3/uL (ref 0.7–4.0)
MCHC: 33.4 g/dL (ref 30.0–36.0)
MCHC: 33.9 g/dL (ref 30.0–36.0)
MCV: 91.6 fL (ref 78.0–100.0)
MCV: 93.5 fL (ref 78.0–100.0)
Monocytes Absolute: 0.5 10*3/uL (ref 0.1–1.0)
Monocytes Absolute: 0.5 10*3/uL (ref 0.1–1.0)
Monocytes Relative: 7 % (ref 3.0–12.0)
Monocytes Relative: 8.1 % (ref 3.0–12.0)
Neutro Abs: 3.3 10*3/uL (ref 1.4–7.7)
Neutro Abs: 4.2 10*3/uL (ref 1.4–7.7)
Neutrophils Relative %: 58.5 % (ref 43.0–77.0)
Neutrophils Relative %: 60 % (ref 43.0–77.0)
Nitrite: NEGATIVE
Nitrite: NEGATIVE
Pap Smear: NORMAL
Pap Smear: NORMAL
Platelets: 298 10*3/uL (ref 150–400)
Platelets: 305 10*3/uL (ref 150.0–400.0)
Potassium: 3.7 meq/L (ref 3.5–5.1)
Potassium: 3.8 meq/L (ref 3.5–5.1)
Protein, U semiquant: NEGATIVE
Protein, U semiquant: NEGATIVE
RBC: 4.22 M/uL (ref 3.87–5.11)
RBC: 4.39 M/uL (ref 3.87–5.11)
RDW: 13.7 % (ref 11.5–14.6)
RDW: 14.7 % — ABNORMAL HIGH (ref 11.5–14.6)
Sodium: 140 meq/L (ref 135–145)
Sodium: 141 meq/L (ref 135–145)
Specific Gravity, Urine: 1.015
Specific Gravity, Urine: <1.005
TSH: 1.46 microintl units/mL (ref 0.35–5.50)
TSH: 1.92 microintl units/mL (ref 0.35–5.50)
Total Bilirubin: 0.7 mg/dL (ref 0.3–1.2)
Total Bilirubin: 0.8 mg/dL (ref 0.3–1.2)
Total CHOL/HDL Ratio: 4.1
Total CHOL/HDL Ratio: 5
Total Protein: 6.5 g/dL (ref 6.0–8.3)
Total Protein: 7 g/dL (ref 6.0–8.3)
Triglycerides: 112 mg/dL (ref 0–149)
Triglycerides: 173 mg/dL — ABNORMAL HIGH (ref 0.0–149.0)
Urobilinogen, UA: NEGATIVE
Urobilinogen, UA: NEGATIVE
VLDL: 22 mg/dL (ref 0–40)
VLDL: 34.6 mg/dL (ref 0.0–40.0)
Vit D, 25-Hydroxy: 43 ng/mL (ref 30–89)
Vit D, 25-Hydroxy: 59 ng/mL (ref 30–89)
WBC Urine, dipstick: NEGATIVE
WBC Urine, dipstick: NEGATIVE
WBC: 5.7 10*3/uL (ref 4.5–10.5)
WBC: 6.9 10*3/uL (ref 4.5–10.5)
pH: 6
pH: 7.5

## 2010-07-20 NOTE — Letter (Signed)
Summary: Previsit letter  [facility]  [address] [place], [place] [address]   Phone: [phone]  Fax: [phone]       01/18/2010 MRN: [id number]  DALYAH PLA [address] [place], [place]  [address]  Dear Ms. Attaway,  Welcome to the Gastroenterology Division at [facility].    You are scheduled to see a nurse for your pre-procedure visit on 03-09-10 at 11:00a.m. on the 3rd floor at [facility], [address]  We ask that you try to arrive at our office 15 minutes prior to your appointment time to allow for check-in.  Your nurse visit will consist of discussing your medical and surgical history, your immediate family medical history, and your medications.    Please bring a complete list of all your medications or, if you prefer, bring the medication bottles and we will list them.  We will need to be aware of both prescribed and over the counter drugs.  We will need to know exact dosage information as well.  If you are on blood thinners (Coumadin, Plavix, Aggrenox, Ticlid, etc.) please call our office today/prior to your appointment, as we need to consult with your physician about holding your medication.   Please be prepared to read and sign documents such as consent forms, a financial agreement, and acknowledgement forms.  If necessary, and with your consent, a friend or relative is welcome to sit-in on the nurse visit with you.  Please bring your insurance card so that we may make a copy of it.  If your insurance requires a referral to see a specialist, please bring your referral form from your primary care physician.  No co-pay is required for this nurse visit.     If you cannot keep your appointment, please call [phone] to cancel or reschedule prior to your appointment date.  This allows [name] the opportunity to schedule an appointment for another patient in need of care.    Thank you for choosing [facility] for your medical  needs.  We appreciate the opportunity to care for you.  Please visit [name] at our website  to learn more about our practice.                     Sincerely.                                                                                                                   The Gastroenterology Division

## 2010-07-20 NOTE — Assessment & Plan Note (Signed)
Summary: rov 1 month///kp   Visit Type:  Follow-up Copy to:  [name] Primary Provider/Referring Provider:  [name]  CC:  pt here for follow-up. pt states qvar gave her hives and itching. Marland Kitchen  History of Present Illness: OV 08/11/2009: Followup asthma and ABPA. Last seen Jul 14, 2009. Was in asthma exacerbation at that  time (fev1 0.98L/36%) . Commenced pred burst and started QVAR. Now presents for followp. Feels better. But as before, she does not like inhaled steroids. Typically has claimed ihaled steroids cause weight gain but now states that QVAR like the others (symbicort, adviar, dulera). also resulted in hives. In the past she said hives was a chronic recurring theme but resolved only when she was on itraconazole. However, she is willing to try asmanex again. We discussed ciclesonide that was starred by [facility] as add on Rx in 2010 and produced best fEv1 ever. She is unwilling to try it for unclear reasons. She is now asking abou LABAs (serevent). Have cautioned her on the dnagers of LABA wihtout inhaled steroid. She is willing to try spiriva. She still continues with chronic daily prednisone. No new complaints.   CardioPerfect Spirometry  ID: [id number] Patient: Victoria [name], Victoria [name] DOB: 09-May-1956 Age: 55 Years Old Sex: Female Race: White Physician: [name] Height: 64 Weight: 177.25 Status: Unconfirmed Past Medical History:  1) ABPA x 12/14/2007  -> IgE 605 on 11/21/2007 (1 month after steroid burst), Eosinophils 10.4% of 5600 cells, Preciptin positive for fumigatus/nidulans/niger, Asp. skin test positive 11/21/2007, CT scan 2005 &  11/22/2007 unchanged bilateral apical scarring and RUL anterior segment bronchiectasis   2) ASTHMA x age 38 months. .......Marland KitchenRamaswamy >Triggers: harsh winter and viruses Hx of subjective intolerance to ICS - "weight gain" >  Prior to singulair 02/1992 -> hospitalized 3-4 times/year. Since then, zero hospitalization/er visits.  > Improved asthma control  [place] moving [place] in [place] . "Failed" xolair in 2004 per hx > 8 flares & prednisone bursts May 2008 -> May 2009.  > Commenced daily prednisone 5mg  daily 12/14/2007  s/p  Itraconazole 05/12/2008 >  10/07/2008 (only 1 flare Rx at home in April 2010)   # PFTs 2007 -  FEV1 1.75/76%, FVC 2.88, FEV1/FVC ratio  61%, TLC at 100%, DLCO 82% PFTs 12/04/2007 at end of pred burst - Fev1 1.7/68%, TLC 5.2/104%, DLCO 17.8/77% -> start daily pred Cleda Daub 02/01/2008  - FEv1 1.62/71%,  Spior 04/21/2008 - Fev1 1.35L/53% -> start itraconazole Spiro 08/01/2008 - Fev1 1.43L/54% ->  cont  itraconazole/daily pred Sprio 10/07/08 - Not done -> dc itraconazole Cleda Daub 11/25/08 - Fev1 1.22L/48% (flare x 2) -> recommence pred & itra -> but she dc'ed itra due to chest pains.  Cleda Daub 01/08/09- Fev1 1.81L/72% (best ever) Cleda Daub 1  # Obesity - BMI 30 -> "secondary to flovent in 1996"  #) Maxillary Sinusitis 2007.Marland KitchenMarland KitchenDr Jearld Fenton  09/2008 - re-referred to ENT  # Depression  # Osteoarthritis / Osteopenia  - on fosmax   # IBS  # Hyperlipidemia  # Mod OSA by hx in 2007.  AHI 22.5 02/2606 Not on CPAP. Refused sleep re-referral in 11/2007  # Pulmonary nodules - small stable nodules:2006 & summer 2007 ct chest. Not present at [facility] 11/2007.   #Hx of recurent Hives since highschool > resolved 04/2008 - 10/2008 while on itracconazole for abpa   > recurred 6/10 while off itraconazole but attributed to vit d and flax seed on visit 12/2008  Recorded: 08/11/2009 1:42 PM  Parameter  Measured Predicted %Predicted FVC     1.83        3.47        52.60 FEV1     1.10        2.74        40 FEV1%   60.04        79.76        75.30 PEF    2.98        6.63        45   Interpretation: sever obstruction. marginally better from last visit  Current Medications (verified): 1)  Singulair 10 Mg  Tabs (Montelukast Sodium) .... Once Daily 2)  Proventil Hfa 108 (90 Base) Mcg/act Aers (Albuterol Sulfate) .... As Needed 3)  Effexor Xr 150 Mg  Cp24  (Venlafaxine Hcl) .Marland Kitchen.. 1 By Mouth Once Daily 4)  Fexofenadine Hcl 180 Mg Tabs (Fexofenadine Hcl) .... Take 1 By Mouth Once Daily 5)  Tussionex Pennkinetic Er 8-10 Mg/57ml Lqcr (Chlorpheniramine-Hydrocodone) .Marland Kitchen.. 1 Tsp By Mouth At Bedtime As Needed Cough 6)  Omeprazole 40 Mg Cpdr (Omeprazole) .... Take 1 Tablet By Mouth Once A Day 7)  Nebulizer Compressor  Misc (Nebulizers) .... Breathing Treatment As Needed 8)  Prednisone 10 Mg Tabs (Prednisone) .... Take 1 Tablet By Mouth Once A Day 9)  Alprazolam 0.5 Mg Tabs (Alprazolam) .... One Tablet By Mouth At Bedtime Prn 10)  Nasonex 50 Mcg/act Susp (Mometasone Furoate) .... 2 Squirts Each Nostril Daily 11)  Duoneb 0.5-2.5 (3) Mg/31ml Soln (Ipratropium-Albuterol) .... Take 4 Times Daily As Needed  Allergies (verified): 1)  ! Sulfa 2)  ! * Flovent 3)  ! * Symbicort 4)  ! * Dulera 5)  ! * Qvar  Past History:  Family History: Last updated: 12/11/2006 Family History Diabetes 1st degree relative Family History Other cancer-Breast  Social History: Last updated: 11/16/2007 Never Smoked Alcohol use-yes Drug use-no Married 1 son with cerebral palsy. Lot of aides at home for son  Risk Factors: Alcohol Use: <1 (08/19/2008) Caffeine Use: 3 (08/19/2008) Exercise: yes (12/15/2006)  Risk Factors: Smoking Status: never (10/21/2008) Passive Smoke Exposure: no (12/15/2006)  Past Medical History: 1) ABPA x 12/14/2007  -> IgE 605 on 11/21/2007 (1 mo after steroid burst), Eos 10.4% of 5600 cells, Preciptin positive for fumigatus/nidulans/niger, Asp. skin test positive 11/21/2007, CT scan 2005 &  11/22/2007 unchanged bilateral apical scarring and RUL anterior segment bronchiectasis   2) ASTHMA x age 38 months. ....Marland KitchenMarland KitchenRamaswamy >Triggers: harsh winter and viruses Hx of subjective intolerance to ICS - "weight gain" >  Prior to singulair 02/1992 -> hospitalized 3-4 times/year. Since then, zero hospitalization/er visits.  > Im[place] asthma control [place] . "Failed" xolair in 2004 per hx > 8 flares & prednisone bursts May 2008 -> May 2009.  > Commenced daily prednisone 5mg  daily 12/14/2007  s/p  Itraconazole 05/12/2008 >  10/07/2008 (only 1 flare Rx at home in 4/ 2010)   # PFTs 2007 -  FEV1 1.75/76%, TLC at 100%, DLCO 82% PFTs 12/04/2007 at end of pred burst - Fev1 1.7/68%, TLC 5.2/104%, DLCO 17.8/77% -> start daily pred Cleda Daub 02/01/2008  - FEv1 1.62/71%,  Spior 04/21/2008 - Fev1 1.35L/53% -> start itraconazole Spiro 08/01/2008 - Fev1 1.43L/54% ->  cont  itraconazole/daily pred Post itraconazole Cleda Daub 11/25/08 - Fev1 1.22L/48% (flare x 2) -> recommence pred & itra -> but she dc'ed itra due to chest pains.  Cleda Daub 01/08/09- Fev1 1.81L/72% (best ever) Cleda Daub 07/14/09 - Fev1  0.98L/36%, Rx a  # Obesity - BMI 30 > "secondary to flovent in 1996"  #) Maxillary Sinusitis 2007.Marland KitchenMarland KitchenDr Jearld Fenton  09/2008 - re-referred to ENT  # Depression  # Osteoarthritis / Osteopenia  - on fosmax   # IBS  # Hyperlipidemia  # Mod OSA by hx in 2007.  AHI 22.5 02/2606  Refused CPAP and sleep re-referral in 11/2007  # Pulmonary nodules - small stable nodules:2006 & summer 2007 ct chest. Not present at [facility] 11/2007.   #Hx of recurent Hives since highschool > resolved 04/2008 - 10/2008 while on itracconazole for abpa   > recurred 6/10 while off itraconazole but attributed to vit d and flax seed on visit 12/2008 > Jan 2011 - attributed to qvar  Past Surgical History: Reviewed history from 11/25/2008 and no changes required. Appendectomy Caesarean section Hx of normal stress test 10/2008  Family History: Reviewed history from 12/11/2006 and no changes required. Family History Diabetes 1st degree relative Family History Other cancer-Breast  Social History: Reviewed history from 11/16/2007 and no changes required. Never Smoked Alcohol use-yes Drug use-no Married 1 son with cerebral palsy. Lot of aides at home for son  Review of Systems  The patient  denies shortness of breath with activity, shortness of breath at rest, productive cough, non-productive cough, coughing up blood, chest pain, irregular heartbeats, acid heartburn, indigestion, loss of appetite, weight change, abdominal pain, difficulty swallowing, sore throat, tooth/dental problems, headaches, nasal congestion/difficulty breathing through nose, sneezing, itching, ear ache, anxiety, depression, hand/feet swelling, joint stiffness or pain, rash, change in color of mucus, and fever.    Vital Signs:  Patient profile:   55 year old female Height:      64 inches Weight:      177.25 pounds O2 Sat:      98 % on Room air Temp:     98.0 degrees F oral Pulse rate:   90 / minute BP sitting:   126 / 84  (right arm) Cuff size:   regular  Vitals Entered By: Carron Curie CMA (August 11, 2009 1:37 PM)  O2 Flow:  Room air CC: pt here for follow-up. pt states qvar gave her hives and itching.    Physical Exam  General:  well developed, well nourished, in no acute distressobese.   Head:  normocephalic and atraumatic Eyes:  PERRLA/EOM intact; conjunctiva and sclera clear Ears:  TMs intact and clear with normal canals Nose:  no deformity, discharge, inflammation, or lesions Mouth:  no deformity or lesions Neck:  no masses, thyromegaly, or abnormal cervical nodes Chest Wall:  no deformities noted Lungs:  clear bilaterally to auscultation and percussion Heart:  regular rate and rhythm, S1, S2 without murmurs, rubs, gallops, or clicks Abdomen:  bowel sounds positive; abdomen soft and non-tender without masses, or organomegaly Obese + Msk:  no deformity or scoliosis noted with normal posture Pulses:  pulses normal Extremities:  no clubbing, cyanosis, edema, or deformity noted Neurologic:  CN II-XII grossly intact with normal reflexes, coordination, muscle strength and tone Skin:  scattered macular urticarial rash in groin, axilla, thigh Cervical Nodes:  no significant  adenopathy Axillary Nodes:  no significant adenopathy Psych:  alert and cooperative; normal mood and affect; normal attention span and concentration   Pulmonary Function Test Date: 08/11/2009 1:42 PM Gender: Female  Pre-Spirometry FVC    Value: 1.83 L/min   % Pred: 52.60 % FEV1    Value: 1.10 L     Pred: 2.74 L     %  Pred: 40 % FEV1/FVC  Value: 60.04 %     % Pred: 75.30 %  Comments: marginally better than last visit  Evaluation: severe obstruction with NO significant bronchodilator response  Impression & Recommendations:  Problem # 1:  ASTHMA, PERSISTENT, SEVERE (ICD-493.90) Assessment Unchanged Marginally better fEv1 only than last visit. Symptomatically she is much better.But fev1 level is still in severe persistent category. I discussed with her the importance and need to optimized lung function. She is fully aware of this and understadns. She just does not want to do inhaled steroids. However, she is willing to give asmanx a try. Also willing to add spiriva to asthma Rx based on 2010 NEJM study  PLAN You are slightly better than last visit COntinue prednisone at 10mg  daily cOnitnue your other medicines Restart asmanex 2 puff at bedtime Start spiriva 1 puff daily in morning Take 3 samples spiriva from [name]  - learn technique return in 2 months Spirometry at followup While on spiriva take duoneb only when absoluteley needed Otherwise use albuterol alone for rescue I hope this combo helps you  Medications Added to Medication List This Visit: 1)  Prednisone 10 Mg Tabs (Prednisone) .... Take 1 tablet by mouth once a day 2)  Asmanex 120 Metered Doses 220 Mcg/inh Aepb (Mometasone furoate) .... One or two puffs nightly 3)  Spiriva Handihaler 18 Mcg Caps (Tiotropium bromide monohydrate) .... One puffs in handihaler daily  Other Orders: HFA Instruction [phone]) Est. Patient Level III ([address])  Patient Instructions: 1)  You are slightly better than last visit 2)  COntinue  prednisone at 10mg  daily 3)  cOnitnue your other medicines 4)  Restart asmanex 2 puff at bedtime 5)  Start spiriva 1 puff daily in morning 6)  Take 3 samples spiriva from [name]  - learn technique 7)  return in 2 months 8)  Spirometry at followup 9)  While on spiriva take duoneb only when absoluteley needed 10)  Otherwise use albuterol alone for rescue 11)  I hope this combo helps you Prescriptions: SPIRIVA HANDIHALER 18 MCG  CAPS (TIOTROPIUM BROMIDE MONOHYDRATE) one puffs in handihaler daily  #1 x 6   Entered and Authorized by:   Kalman Shan MD   Signed by:   Kalman Shan MD on 08/11/2009   Method used:   [facility]  [address] [id number]       [address]       [place], [place]  [address]       Ph: [phone] or [phone]       Fax: [phone]   RxID:   [id number] ASMANEX 120 METERED DOSES 220 MCG/INH  AEPB (MOMETASONE FUROATE) one or two puffs nightly  #1 x 6   Entered and Authorized by:   Kalman Shan MD   Signed by:   Kalman Shan MD on 08/11/2009   Method used:   [facility]        [facility]  [address] [id number]       [address]       [place], [place]  [address]       Ph: [phone] or [phone]       Fax: [phone]   RxID:   [id number]

## 2010-07-20 NOTE — Progress Notes (Signed)
Summary: cough/ rx request- pt called again  LMTCBx1  Phone Note Call from Patient   Caller: Patient Call For: Victoria [name] Summary of Call: pt c/o cough x 3 days. pt not sleeping. yellow phlegm for 3 days but no phlegm now. just dry cough. says the only thing that helps this is tussinex suspension 8 oz so her co-pay will be less. no fever or other complaints[facility] [address]  Initial call taken by: Tivis Ringer,  June 23, 2008 8:54 AM  Follow-up for Phone Call        pls enquire about her tussionex history. SHe has been on tessalon cough perles in past. I need to know more before giving it to her. Why does she not think she is having an asthma attack and not come in for antibiotics/steroids? Kalman Shan MD  June 23, 2008 6:25 PM  Follow-up by: Kalman Shan MD,  June 23, 2008 6:25 PM  Additional Follow-up for Phone Call Additional follow up Details #1::        [id number]. Need to get more info on pt's symptoms.  Aundra Millet Reynolds LPN  June 24, 2008 10:46 AM  [facility] [address] [facility] get Tussionex refill hx and per their records pt last had this med filled 03-18-2007.  Also called [facility] as it looks like pt has used this pharmacy in the past and their records indicate pt has never had tussionex refilled there. Aundra Millet Reynolds LPN  June 24, 2008 10:48 AM     Additional Follow-up for Phone Call Additional follow up Details #2::    Spoke with pt.  She states that she has tried benzonatate in the past without any help.  She states that she is now starting to produce some sputum when coughs which is yellow.  She was seen by Dr. Laury Axon this am and states that she was txed with tussionex and zpack.  She will keep her followup with MR for this Friday.   Follow-up by: Vernie Murders,  June 24, 2008 4:22 PM

## 2010-07-20 NOTE — Letter (Signed)
Summary: Letter from patient  Letter from patient   Imported By: Sherian Rein 09/24/2009 10:39:57  _____________________________________________________________________  External Attachment:    Type:   Image     Comment:   External Document

## 2010-07-20 NOTE — Progress Notes (Signed)
Summary: Refill Request  Phone Note Refill Request Call back at [phone] Message from:  Pharmacy on Nov 05, 2009 2:12 PM  Refills Requested: Medication #1:  Sandria Senter ER 8-10 MG/5ML LQCR 1 tsp by mouth at bedtime as needed cough   Dosage confirmed as above?[facility]irmed [facility] [address]   Next Appointment Scheduled: 6.16.11 Initial call taken by: Harold Barban,  Nov 05, 2009 2:13 PM  Follow-up for Phone Call        she just saw pulm-- she should call them Follow-up by: [name],  Nov 05, 2009 3:09 PM  Additional Follow-up for Phone Call Additional follow up Details #1::        LMTCB. Army Fossa CMA  Nov 05, 2009 3:10 PM     Additional Follow-up for Phone Call Additional follow up Details #2::    Pulm told her to call her PCP. Army Fossa CMA  Nov 05, 2009 3:11 PM   Additional Follow-up for Phone Call Additional follow up Details #3:: Details for Additional Follow-up Action Taken: per dr Laury Axon call in 6 oz. Army Fossa CMA  Nov 05, 2009 5:11 PM   New/Updated Medications: Sandria Senter ER 8-10 MG/5ML LQCR (CHLORPHENIRAMINE-HYDROCODONE) 1 tsp by mouth at bedtime as needed cough Prescriptions: TUSSIONEX PENNKINETIC ER 8-10 MG/5ML LQCR (CHLORPHENIRAMINE-HYDROCODONE) 1 tsp by mouth at bedtime as needed cough  #6 oz x 0   Entered by:   Army Fossa CMA   Authorized by:   [name]   Signed by:   Army Fossa CMA on 11/05/2009   Method used:   Printed then faxed to ...       [facility]  [address] [id number]       [address]       [place], [place]  [address]       Ph: [phone] or [phone]       Fax: [phone]   RxID:   [id number]

## 2010-07-20 NOTE — Assessment & Plan Note (Signed)
Summary: allergy testing ok per susanne/apc   Visit Type:  Follow-up Referred by:  [name] PCP:  [name]  Chief Complaint:  OFFICE VISIT.....Marland KitchenCONGESTION.....Marland KitchenCOUGHING.....WORSE AT NIGHT.........  History of Present Illness: Followup  55 year old female on followup for  #aspergillus skin testing - placed today and is POSITIVE compared to control #interim since last week had developed symptoms of exacerbation #No other issues        Updated Prior Medication List: SINGULAIR 10 MG  TABS (MONTELUKAST SODIUM) once daily ALBUTEROL   AERS (ALBUTEROL AERS)  DUONEB 2.5-0.5 MG/3ML  SOLN (ALBUTEROL-IPRATROPIUM)  EFFEXOR XR 150 MG  CP24 (VENLAFAXINE HCL) 1 by mouth once daily FOSAMAX 35 MG  TABS (ALENDRONATE SODIUM) One tablet weekly NASONEX 50 MCG/ACT  SUSP (MOMETASONE FUROATE) One spray in each nostril twice daily BENZONATATE 100 MG  CAPS (BENZONATATE) Take q6hrs for cough ZOCOR 40 MG  TABS (SIMVASTATIN) Take one tablet daily SEREVENT DISKUS 50 MCG/DOSE  AEPB (SALMETEROL XINAFOATE) once daily as needed ASMANEX 120 METERED DOSES 220 MCG/INH  AEPB (MOMETASONE FUROATE) once daily as needed PREDNISONE 10 MG  TABS (PREDNISONE) Take 6 tablets daily x3 days, then 4 tablets daily x3 days, then 3 tablets daily x 3 days, then 2 tablets daily x3 days, then 1 tablet daily x3 days, then stop  Current Allergies (reviewed today): ! SULFA ! * FLOVENT  Past Medical History:    Asthma x since age 60 months. Severr winter <-29F and viral infections are typical triggers. 8 prednisone bursts between may 2008-may 2009. Prior to 02/1992 - hospitaliaxed 3-4 times per year and 3-4 ytimes per year. Since, 02/2002 took singulair which has reduced hospitalization and er visits to zero. Started flovent in 1997 but had systemic steroid intolerance. Tolerates asmanex inh steroid only.        Moved to [place] from [place], [place] in 2004 due to asthma due to the winter chill in [place], [place]. Prior to move, winter asthma  would be signficant enought that she would be on prednisone at least every few weeks.  Says since move to [place], [place] asthma is much better. Sasy asthma is not made worse by the pollen, spring or summer heat/humdidity. Currently asthma triggers are sinus infections and viral infection. [place] winter does not bother asthma. Nevertheless, has had 8 course of prednisone between May 2008 - May 2009 but no er visits, no hospitalizations. Exacerbations trypiclaly treated with antiiotics and steroids        Hx of "ectremely high IgE levels" - fialed xolair therapy in 2004 per echart.         Denies diagnosis of ABPA and workup for same        PFTs 2007 - Pulmonary function showed an FEV1 of 1.75/76%, FVC 2.88, FEV1/FVC ratio of 61%     of predicted. Total lung capacity at 100% predicated, diffusion capacity to     82% predicted. Values are compatible with moderate persistent obstructive     defect.        Obesity - BMI 30        Maxillary Sinusitsis 2007 - Rx by ENT    Depression    Osteoarthritis    Osteopenia    IBS    Hyperlipidemia    Mod OSA - in 2007. Not on CPAP ? why   Family History:    Reviewed history from 12/11/2006 and no changes required:       Family History Diabetes 1st degree relative  Family History Other cancer-Breast  Social History:    Reviewed history from 11/16/2007 and no changes required:       Never Smoked       Alcohol use-yes       Drug use-no       Married 1 son with cerebral palsy. Lot of aides at home for son   Risk Factors: Tobacco use:  never Passive smoke exposure:  no Drug use:  no Alcohol use:  yes Exercise:  yes    Times per week:  2    Type:  water  Seatbelt use:  100 %  Family History Risk Factors:    Family History of MI in females < 35 years old:  no    Family History of MI in males < 80 years old:  no   Review of Systems      See HPI   Vital Signs:  Patient Profile:   55 Years Old Female Height:     63.5 inches Weight:       171.8 pounds BMI:     30.06 O2 Sat:      98 % O2 treatment:    Room Air Temp:     97.6 degrees F oral Pulse rate:   92 / minute BP sitting:   110 / 60  (left arm) Cuff size:   regular  Pt. in pain?   no  Vitals Entered By: Clarise Cruz Duncan Dull) (November 21, 2007 10:52 AM)                  Physical Exam  General:     obese.   Head:     normocephalic and atraumatic Eyes:     PERRLA/EOM intact; conjunctiva and sclera clear Ears:     TMs intact and clear with normal canals Nose:     no deformity, discharge, inflammation, or lesions Mouth:     no deformity or lesions Neck:     no masses, thyromegaly, or abnormal cervical nodes Chest Wall:     no deformities noted Lungs:     rhonchi bilateral and prolonged exhilation.   Heart:     regular rate and rhythm, S1, S2 without murmurs, rubs, gallops, or clicks Abdomen:     bowel sounds positive; abdomen soft and non-tender without masses, or organomegaly Msk:     no deformity or scoliosis noted with normal posture Pulses:     pulses normal Extremities:     no clubbing, cyanosis, edema, or deformity noted Neurologic:     CN II-XII grossly intact with normal reflexes, coordination, muscle strength and tone Skin:     intact without lesions or rashes Cervical Nodes:     no significant adenopathy Axillary Nodes:     No palpable lymphadenopathy Psych:     alert and cooperative; normal mood and affect; normal attention span and concentration   MISC. Report  Procedure date:  11/21/2007  Findings:      Aspergillus skin testing - postivie    Impression & Recommendations:  Problem # 1:  ASTHMA, PERSISTENT, SEVERE (ICD-493.90) Assessment: Deteriorated Currently in exacerbation with 260cc drop in FEv1 compared to 2007. Will treat with 12-15 day prednisone taper Her updated medication list for this problem includes:    Singulair 10 Mg Tabs (Montelukast sodium) ..... Once daily    Albuterol Aers (Albuterol aers)     Duoneb 2.5-0.5 Mg/34ml Soln (Albuterol-ipratropium)    Serevent Diskus 50 Mcg/dose Aepb (Salmeterol xinafoate) ..... Once daily as needed  Asmanex 120 Metered Doses 220 Mcg/inh Aepb (Mometasone furoate) ..... Once daily as needed    Prednisone 10 Mg Tabs (Prednisone) .Marland Kitchen... Take 6 tablets daily x3 days, then 4 tablets daily x3 days, then 3 tablets daily x 3 days, then 2 tablets daily x3 days, then 1 tablet daily x3 days, then stop  Orders: T-IgE (Immunoglobulin E) (52841-32440) T- * Misc. Laboratory test [phone]) Radiology Referral (Radiology) Allergy Puncture Test (95004) Est. Patient Level IV (53664)   Problem # 2:  ALLERGIC BRONCHOPULMONARY ASPERGILLOSIS (ICD-518.6) Unclear if she has ABPA. Hx of Asthma. Bilateral UL scarring on CT with some central bronchiectasis. Today Skin test positive. Will get IgE levels, Aspergillus precipitis and IgE/IgA level against A Fumigatus. Her updated medication list for this problem includes:    Prednisone 10 Mg Tabs (Prednisone) .Marland Kitchen... Take 6 tablets daily x3 days, then 4 tablets daily x3 days, then 3 tablets daily x 3 days, then 2 tablets daily x3 days, then 1 tablet daily x3 days, then stop   Problem # 3:  OBSTRUCTIVE SLEEP APNEA (ICD-780.57) She wanst to defere workup for this  Medications Added to Medication List This Visit: 1)  Prednisone 10 Mg Tabs (Prednisone) .... Take 6 tablets daily x3 days, then 4 tablets daily x3 days, then 3 tablets daily x 3 days, then 2 tablets daily x3 days, then 1 tablet daily x3 days, then stop  Other Orders: Allergy Puncture Test (95004) TLB-CBC Platelet - w/Differential (85025-CBCD)   Patient Instructions: 1)  You are likely to have ABPA- have blood tests too confirm this diagnosis 2)  You have asthma exacerbation today - we are starting prednisone 3)  DO NOT START PREDNISONE till blood tests today are completed 4)  TEll schedulers to cancel sleep study 5)  HAVE CT CHEST  6)  HAVE PFT mid-June during your  prednisone course (as scheduled) 7)  return to my office after pft and ct chest 8)  Talk to front desk and make sure charges for last week skin testing  is cancelled   Prescriptions: PREDNISONE 10 MG  TABS (PREDNISONE) Take 6 tablets daily x3 days, then 4 tablets daily x3 days, then 3 tablets daily x 3 days, then 2 tablets daily x3 days, then 1 tablet daily x3 days, then stop  #48 x 1   Entered and Authorized by:   Kalman Shan MD   Signed by:   Kalman Shan MD on 11/21/2007   Method used:   Print then Give to Patient   RxID:   [email or web address]  [email or web address]  SPIROMETRY RESULTS  Date:11/21/2007 height inches: 63.5 Gender: Female  Parameter  Measured Predicted %Predicted  FVC      2.4        3.24      74  FEV1      1.49        2.65      56.00  FEV1/FVC      0.62        0.81      77  FEF25-75      0.81        2.84      29.00  PEF              403      0  Post-Bronchodilator  Parameter Measured Change from Baseline  FVC               FEV1  FEV1/FVC                FEF25-75                PEF               Pulse Oximetry Pulse: 92 O2 Saturation %: 98  Comments: o 03/21/2006 -fev1 was 1.75L/73%. Today's fev1 is down by 260cc and signfiicantly worse  Evaluation:  severe obstruction with NO significant bronchodilator response

## 2010-07-20 NOTE — Progress Notes (Signed)
Summary: [facility]--Refill  Phone Note Refill Request   Refills Requested: Medication #1:  EFFEXOR XR 150 MG  CP24 1 by mouth once daily [employer] [facility]---[phone]  Initial call taken by: Freddy Jaksch,  January 02, 2008 2:23 PM      Prescriptions: EFFEXOR XR 150 MG  CP24 (VENLAFAXINE HCL) 1 by mouth once daily  #90 x 3   Entered by:   Kandice Hams   Authorized by:   [name]   Signed by:   Kandice Hams on 01/02/2008   Method used:   Printed then faxed to ...       [address] [facility]       [address]       [place], [place]  [address]       Ph: [phone]       Fax: [phone]   RxID:   [id number]

## 2010-07-20 NOTE — Assessment & Plan Note (Signed)
Summary: cpx//lch   Vital Signs:  Patient profile:   55 year old female Height:      64 inches Weight:      169.25 pounds Temp:     97.1 degrees F oral Pulse rate:   88 / minute Pulse rhythm:   regular BP sitting:   124 / 76  (left arm) Cuff size:   regular  Vitals Entered By: Army Fossa CMA (December 03, 2009 8:58 AM) CC: Pt here for CPX, no pap   History of Present Illness: Pt here for cpe, labs--- no pap--pt sees Dr Vincente Poli.     Hyperlipidemia follow-up      This is a 55 year old woman who presents for Hyperlipidemia follow-up.  The patient denies muscle aches, GI upset, abdominal pain, flushing, itching, constipation, diarrhea, and fatigue.  The patient denies the following symptoms: chest pain/pressure, exercise intolerance, dypsnea, palpitations, syncope, and pedal edema.  Compliance with medications (by patient report) has been near 100%.  Dietary compliance has been good.  The patient reports exercising occasionally.  Adjunctive measures currently used by the patient include fish oil supplements, limiting alcohol consumpton, and weight reduction.    Hypertension follow-up      The patient also presents for Hypertension follow-up.  The patient denies lightheadedness, urinary frequency, headaches, edema, impotence, rash, and fatigue.  The patient denies the following associated symptoms: chest pain, chest pressure, exercise intolerance, dyspnea, palpitations, syncope, leg edema, and pedal edema.  Compliance with medications (by patient report) has been near 100%.  The patient reports that dietary compliance has been good.  The patient reports no exercise.  Adjunctive measures currently used by the patient include salt restriction.    Preventive Screening-Counseling & Management  Alcohol-Tobacco     Alcohol drinks/day: <1     Alcohol type: wine     Smoking Status: never     Passive Smoke Exposure: no  Caffeine-Diet-Exercise     Caffeine use/day: 3     Does Patient Exercise:  no     Exercise Counseling: to improve exercise regimen  Hep-HIV-STD-Contraception     Dental Visit-last 6 months yes     Dental Care Counseling: not indicated; dental care within six months     SBE monthly: yes     SBE Education/Counseling: not indicated; SBE done regularly  Safety-Violence-Falls     Seat Belt Use: 100      Sexual History:  currently monogamous.    Current Medications (verified): 1)  Singulair 10 Mg  Tabs (Montelukast Sodium) .... Twice  Daily 2)  Proventil Hfa 108 (90 Base) Mcg/act Aers (Albuterol Sulfate) .... As Needed 3)  Effexor Xr 150 Mg  Cp24 (Venlafaxine Hcl) .Marland Kitchen.. 1 By Mouth Once Daily 4)  Fexofenadine Hcl 180 Mg Tabs (Fexofenadine Hcl) .... Take 1 By Mouth Once Daily 5)  Tussionex Pennkinetic Er 8-10 Mg/25ml Lqcr (Chlorpheniramine-Hydrocodone) .Marland Kitchen.. 1 Tsp By Mouth At Bedtime As Needed Cough 6)  Nebulizer Compressor  Misc (Nebulizers) .... Breathing Treatment As Needed 7)  Prednisone 10 Mg Tabs (Prednisone) .... Take 1 Tablet By Mouth Once A Day 8)  Alprazolam 0.5 Mg Tabs (Alprazolam) .... One Tablet By Mouth At Bedtime Prn 9)  Nasonex 50 Mcg/act Susp (Mometasone Furoate) .... 2 Squirts Each Nostril Daily 10)  Spiriva Handihaler 18 Mcg Caps (Tiotropium Bromide Monohydrate) .... One Puff in The Morning 11)  Asmanex 120 Metered Doses 220 Mcg/inh Aepb (Mometasone Furoate) .... 2 Puffs in The Evening 12)  Prednisone 1 Mg Tabs (Prednisone) .Marland KitchenMarland KitchenMarland Kitchen  Take As Directed 13)  Shaklee Osteomatrix -- Ca, Mg, Vita D .... 2 By Mouth Two Times A Day 14)  Mega Red---Omega 3 .Marland Kitchen.. 1 By Mouth Once Daily 15)  Nutrafuron--Shaklee 16)  Centrum Silver  Tabs (Multiple Vitamins-Minerals) .Marland Kitchen.. 1 By Mouth Once Daily 17)  Tessalon Perles 100 Mg Caps (Benzonatate) .Marland Kitchen.. 1 By Mouth Three Times A Day As Needed  Allergies: 1)  ! Sulfa 2)  ! * Flovent 3)  ! * Symbicort 4)  ! * Dulera 5)  ! * Qvar  Past History:  Family History: Last updated: 12/11/2006 Family History Diabetes 1st degree  relative Family History Other cancer-Breast  Social History: Last updated: 11/16/2007 Never Smoked Alcohol use-yes Drug use-no Married 1 son with cerebral palsy. Lot of aides at home for son  Risk Factors: Alcohol Use: <1 (12/03/2009) Caffeine Use: 3 (12/03/2009) Exercise: no (12/03/2009)  Risk Factors: Smoking Status: never (12/03/2009) Passive Smoke Exposure: no (12/03/2009)  Past medical, surgical, family and social histories (including risk factors) reviewed for relevance to current acute and chronic problems.  Past Medical History: Reviewed history from 10/28/2009 and no changes required. # ABPA x 12/14/2007 .Marland KitchenMarland KitchenMarland KitchenRamaswamy  # ASTHMA x age 19 months. ....Marland KitchenMarland KitchenRamaswamy  # Obesity - BMI 30 > "secondary to flovent in 1996"  #) Maxillary Sinusitis 2007.Marland KitchenMarland KitchenDr Jearld Fenton  09/2008 - re-referred to ENT  # Depression  # Osteoarthritis / Osteopenia  - on fosmax   # IBS  # Hyperlipidemia  # Mod OSA by hx in 2007.  AHI 22.5 02/2606  Refused CPAP and sleep re-referral in 11/2007  # Pulmonary nodules - small stable nodules:2006 & summer 2007 ct chest. Not present at [facility] 11/2007.   #Hx of recurent Hives since highschool > resolved 04/2008 - 10/2008 while on itracconazole for abpa   > recurred 6/10 while off itraconazole but attributed to vit d and flax seed on visit 12/2008 > Jan 2011 - attributed to qvar > feb 2011 - claims that in past symbicort, advair, dulera all cause hives  Past Surgical History: Reviewed history from 11/25/2008 and no changes required. Appendectomy Caesarean section Hx of normal stress test 10/2008  Family History: Reviewed history from 12/11/2006 and no changes required. Family History Diabetes 1st degree relative Family History Other cancer-Breast  Social History: Reviewed history from 11/16/2007 and no changes required. Never Smoked Alcohol use-yes Drug use-no Married 1 son with cerebral palsy. Lot of aides at home for son Does Patient Exercise:   no Dental Care w/in 6 mos.:  yes Sexual History:  currently monogamous  Review of Systems      See HPI General:  Denies chills, fatigue, fever, loss of appetite, malaise, sleep disorder, sweats, weakness, and weight loss. Eyes:  Denies blurring, discharge, double vision, eye irritation, eye pain, halos, itching, light sensitivity, red eye, vision loss-1 eye, and vision loss-both eyes; optho--q1y. ENT:  Denies decreased hearing, difficulty swallowing, ear discharge, earache, hoarseness, nasal congestion, nosebleeds, postnasal drainage, ringing in ears, sinus pressure, and sore throat. CV:  Denies bluish discoloration of lips or nails, chest pain or discomfort, difficulty breathing at night, difficulty breathing while lying down, fainting, fatigue, leg cramps with exertion, lightheadness, near fainting, palpitations, shortness of breath with exertion, swelling of feet, swelling of hands, and weight gain. Resp:  Complains of cough; denies chest discomfort, chest pain with inspiration, coughing up blood, excessive snoring, hypersomnolence, morning headaches, pleuritic, shortness of breath, sputum productive, and wheezing. GI:  Complains of abdominal pain; told by gyn she had  hernia. GU:  Complains of incontinence; denies abnormal vaginal bleeding, decreased libido, discharge, dysuria, genital sores, hematuria, nocturia, urinary frequency, and urinary hesitancy. MS:  Denies joint pain, joint redness, joint swelling, loss of strength, low back pain, mid back pain, muscle aches, muscle , cramps, muscle weakness, stiffness, and thoracic pain. Derm:  Denies changes in color of skin, changes in nail beds, dryness, excessive perspiration, flushing, hair loss, insect bite(s), itching, lesion(s), poor wound healing, and rash. Neuro:  Denies brief paralysis, difficulty with concentration, disturbances in coordination, falling down, headaches, inability to speak, memory loss, numbness, poor balance, seizures,  sensation of room spinning, tingling, tremors, visual disturbances, and weakness. Psych:  Denies alternate hallucination ( auditory/visual), anxiety, depression, easily angered, easily tearful, irritability, mental problems, panic attacks, sense of great danger, suicidal thoughts/plans, thoughts of violence, unusual visions or sounds, and thoughts /plans of harming others. Endo:  Denies cold intolerance, excessive hunger, excessive thirst, excessive urination, heat intolerance, polyuria, and weight change. Heme:  Denies abnormal bruising, bleeding, enlarge lymph nodes, fevers, pallor, and skin discoloration. Allergy:  Denies hives or rash, itching eyes, persistent infections, seasonal allergies, and sneezing.  Physical Exam  General:  Well-developed,well-nourished,in no acute distress; alert,appropriate and cooperative throughout examination Head:  Normocephalic and atraumatic without obvious abnormalities. No apparent alopecia or balding. Eyes:  pupils equal, pupils round, pupils reactive to light, and no injection.   Ears:  External ear exam shows no significant lesions or deformities.  Otoscopic examination reveals clear canals, tympanic membranes are intact bilaterally without bulging, retraction, inflammation or discharge. Hearing is grossly normal bilaterally. Nose:  External nasal examination shows no deformity or inflammation. Nasal mucosa are pink and moist without lesions or exudates. Mouth:  Oral mucosa and oropharynx without lesions or exudates.  Teeth in good repair. Neck:  No deformities, masses, or tenderness noted. Chest Wall:  No deformities, masses, or tenderness noted. Breasts:  gyn Lungs:  Normal respiratory effort, chest expands symmetrically. Lungs are clear to auscultation, no crackles or wheezes. Heart:  normal rate and no murmur.   Abdomen:  soft.   + ventral hernia--reducible Rectal:  gyn Genitalia:  gyn Msk:  normal ROM, no joint tenderness, no joint swelling, no  joint warmth, no redness over joints, no joint deformities, no joint instability, and no crepitation.   Pulses:  R posterior tibial normal, R dorsalis pedis normal, R carotid normal, L posterior tibial normal, L dorsalis pedis normal, and L carotid normal.   Extremities:  No clubbing, cyanosis, edema, or deformity noted with normal full range of motion of all joints.   Neurologic:  No cranial nerve deficits noted. Station and gait are normal. Plantar reflexes are down-going bilaterally. DTRs are symmetrical throughout. Sensory, motor and coordinative functions appear intact. Skin:  Intact without suspicious lesions or rashes Cervical Nodes:  No lymphadenopathy noted Psych:  Oriented X3 and normally interactive.     Impression & Recommendations:  Problem # 1:  PREVENTIVE HEALTH CARE (ICD-V70.0) ghm utd Orders: Gastroenterology Referral (GI) EKG w/ Interpretation (93000) Venipuncture (60454) TLB-Lipid Panel (80061-LIPID) TLB-BMP (Basic Metabolic Panel-BMET) (80048-METABOL) TLB-CBC Platelet - w/Differential (85025-CBCD) TLB-Hepatic/Liver Function Pnl (80076-HEPATIC) TLB-TSH (Thyroid Stimulating Hormone) (84443-TSH) T-Vitamin D (25-Hydroxy) (09811-91478)  Problem # 2:  COUGH, CHRONIC (ICD-786.2) per pulm tessalon perles  Problem # 3:  HERNIA, VENTRAL (ICD-553.20)  Orders: Surgical Referral (Surgery)  Complete Medication List: 1)  Singulair 10 Mg Tabs (Montelukast sodium) .... Twice  daily 2)  Proventil Hfa 108 (90 Base) Mcg/act Aers (Albuterol sulfate) .... As needed  3)  Effexor Xr 150 Mg Cp24 (Venlafaxine hcl) .Marland Kitchen.. 1 by mouth once daily 4)  Fexofenadine Hcl 180 Mg Tabs (Fexofenadine hcl) .... Take 1 by mouth once daily 5)  Tussionex Pennkinetic Er 8-10 Mg/47ml Lqcr (Chlorpheniramine-hydrocodone) .Marland Kitchen.. 1 tsp by mouth at bedtime as needed cough 6)  Nebulizer Compressor Misc (Nebulizers) .... Breathing treatment as needed 7)  Prednisone 10 Mg Tabs (Prednisone) .... Take 1 tablet by  mouth once a day 8)  Alprazolam 0.5 Mg Tabs (Alprazolam) .... One tablet by mouth at bedtime prn 9)  Nasonex 50 Mcg/act Susp (Mometasone furoate) .... 2 squirts each nostril daily 10)  Spiriva Handihaler 18 Mcg Caps (Tiotropium bromide monohydrate) .... One puff in the morning 11)  Asmanex 120 Metered Doses 220 Mcg/inh Aepb (Mometasone furoate) .... 2 puffs in the evening 12)  Prednisone 1 Mg Tabs (Prednisone) .... Take as directed 13)  Shaklee Osteomatrix -- Ca, Mg, Vita D  .... 2 by mouth two times a day 14)  Mega Red---omega 3  .Marland Kitchen.. 1 by mouth once daily 15)  Nutrafuron--shaklee  16)  Centrum Silver Tabs (Multiple vitamins-minerals) .Marland Kitchen.. 1 by mouth once daily 17)  Tessalon Perles 100 Mg Caps (Benzonatate) .Marland Kitchen.. 1 by mouth three times a day as needed Prescriptions: EFFEXOR XR 150 MG  CP24 (VENLAFAXINE HCL) 1 by mouth once daily  #90 x 3   Entered and Authorized by:   [name]   Signed by:   [name] on 12/03/2009   Method used:   Faxed to ...       [facility] (mail-order)       [address]       [place], [place]  [address]       Ph: [phone]       Fax: [phone]   RxID:   [id number] EFFEXOR XR 150 MG  CP24 (VENLAFAXINE HCL) 1 by mouth once daily  #90 x 3   Entered and Authorized by:   [name]   Signed by:   [name] on 12/03/2009   Method used:   Print then Give to Patient   RxID:   [id number] TESSALON PERLES 100 MG CAPS (BENZONATATE) 1 by mouth three times a day as needed  #60 x 2   Entered and Authorized by:   [name]   Signed by:   [name] on 12/03/2009   Method used:   Electronically to        [facility]  [id number] (ret[facility])       [address]       [place], [place]  [address]       Ph: [phone] or [phone]       Fax: [phone]   RxID:   [id number]    EKG  Procedure date:  12/03/2009  Findings:      Normal sinus rhythm with rate of:  74  Bone Density  Procedure date:   12/03/2009  Comments:      Assessment:  Normal.     TD Result Date:  07/29/2009 TD Result:  given TD Next Due:  10 yr Last PAP:  Normal (07/02/2008 9:33:32 AM) PAP Result Date:  07/29/2009 PAP Result:  normal PAP Next Due:  1 yr Last Mammogram:  Normal Bilateral (07/02/2008 9:33:32 AM) Mammogram Result Date:  07/29/2009 Mammogram Result:  osteopenia Mammogram Next Due:  1 yr  Laboratory Results   Urine Tests  Date/Time Reported: December 03, 2009 10:42 AM   Routine Urinalysis   Color: yellow Appearance: Clear Glucose: negative   (Normal Range: Negative) Bilirubin: negative   (Normal Range: Negative) Ketone: negative   (Normal Range: Negative) Spec. Gravity: <1.005   (Normal Range: 1.003-1.035) Blood: negative   (Normal Range: Negative) pH: 7.5   (Normal Range: 5.0-8.0) Protein: negative   (Normal Range: Negative) Urobilinogen: negative   (Normal Range: 0-1) Nitrite: negative   (Normal Range: Negative) Leukocyte Esterace: negative   (Normal Range: Negative)    Comments: Floydene Flock  December 03, 2009 10:42 AM

## 2010-07-20 NOTE — Progress Notes (Signed)
Summary: exposure to pna   ATC- Line busy   Phone Note Call from Patient Call back at [facility] Phone [phone]   Caller: Patient Call For: ramaswamy Reason for Call: Talk to Nurse Summary of Call: exposed to pna last week for 4 days by a guest in her home.   pt is severe asthmatic, she feels really bad, all over aches, tired and rundown feeling.  Is there anything she should do to fight this off?   Initial call taken by: Eugene Gavia,  December 18, 2008 1:36 PM  Follow-up for Phone Call        ATC pt at home #.  LIne busy.  Will try back later.  Aundra Millet Reynolds LPN  December 19, 979 2:35 PM   Spoke with pt.  C/o fatigue and "achy all over".  Also states that she has decreased prednisone per MR's instructions and now her hives are back.  Appt with TP this afternoon at 3:15 Follow-up by: Vernie Murders,  December 19, 2008 10:01 AM

## 2010-07-20 NOTE — Letter (Signed)
Summary: Previsit letter   [facility]  [address] [place], [place] [address]   Phone: [phone]  Fax: [phone]       12/04/2009 MRN: [id number]  Victoria [name] [address] [place], [place]  [address]  Dear Ms. Doolin,  Welcome to the Gastroenterology Division at [facility].    You are scheduled to see a nurse for your pre-procedure visit on 01-07-10 at 8:30a.m. on the 3rd floor at [facility], [address]  We ask that you try to arrive at our office 15 minutes prior to your appointment time to allow for check-in.  Your nurse visit will consist of discussing your medical and surgical history, your immediate family medical history, and your medications.    Please bring a complete list of all your medications or, if you prefer, bring the medication bottles and we will list them.  We will need to be aware of both prescribed and over the counter drugs.  We will need to know exact dosage information as well.  If you are on blood thinners (Coumadin, Plavix, Aggrenox, Ticlid, etc.) please call our office today/prior to your appointment, as we need to consult with your physician about holding your medication.   Please be prepared to read and sign documents such as consent forms, a financial agreement, and acknowledgement forms.  If necessary, and with your consent, a friend or relative is welcome to sit-in on the nurse visit with you.  Please bring your insurance card so that we may make a copy of it.  If your insurance requires a referral to see a specialist, please bring your referral form from your primary care physician.  No co-pay is required for this nurse visit.     If you cannot keep your appointment, please call [phone] to cancel or reschedule prior to your appointment date.  This allows [name] the opportunity to schedule an appointment for another patient in need of care.    Thank you for choosing [facility] for your medical  needs.  We appreciate the opportunity to care for you.  Please visit [name] at our website  to learn more about our practice.                     Sincerely.                                                                                                                   The Gastroenterology Division

## 2010-07-20 NOTE — Letter (Signed)
Summary: Letter to Patient Regarding Mammogram Results/[facility]  Letter to Patient Regarding Mammogram Results/[facility]   Imported By: Lanelle Bal 08/26/2009 11:35:12  _____________________________________________________________________  External Attachment:    Type:   Image     Comment:   External Document

## 2010-07-20 NOTE — Assessment & Plan Note (Signed)
Summary: ROV 2 MONTHS W/ SPIROMETRY///kp   Visit Type:  Follow-up Copy to:  [name] Primary Provider/Referring Provider:  [name]  CC:  Follow up, pt states few weeks ago she had to go on prednisone and a zpak, pt states she is better, and pt states she needs refills on Duoneb bc pt states she lost the presciption and tussinex pennkinetic and singulair.  History of Present Illness: OV 10/28/2009. Followup severe persistent asthma (refuses to take ICS+LABA combo due to hives and weight gain but will take oral pred) and ABPA. Last seen end FEb 2011. At that time, after much discussion and again reiterating the safety of inhaled steroids, we started spiriva and asamex for asthma control with advice to continue singulair. SHe then tried to taper prednisone down to 6mg  daily but by 09/18/2009 she had AEASTHMA and we treate her over phone with pred burst. She then wanted to double the dose of singulair based on her internet readings. I agreed to it and she is now on singulair two times a day along with spiriva and asmanex which she states she is compliant. Currently doing well and using albuterol < 1 time in past 2-3 weeks. She has no new questions or concerns. She is asking for tussionex refil which I have deferred to PMD. I do not think she needs to be onit.   [facility] Spirometry  ID: [id number] Patient: Victoria [name], Victoria [name] DOB: 1955/08/18 Age: 55 Years Old Sex: Female Race: White Physician: [name] Height: 64 Weight: 172 Status: Unconfirmed Past Medical History:  1) ABPA x 12/14/2007  -> IgE 605 on 11/21/2007 (1 mo after steroid burst), Eos 10.4% of 5600 cells, Preciptin positive for fumigatus/nidulans/niger, Asp. skin test positive 11/21/2007, CT scan 2005 &  11/22/2007 unchanged bilateral apical scarring and RUL anterior segment bronchiectasis   2) ASTHMA x age 47 months. ....Marland KitchenMarland KitchenRamaswamy >Triggers: harsh winter and viruses Hx of subjective intolerance to ICS - "weight gain" >   Prior to singulair 02/1992 -> hospitalized 3-4 times/year. Since then, zero hospitalization/er visits.  > Improved asthma control after moving [place] in 2004 . "Failed" xolair in 2004 per hx > 8 flares & prednisone bursts May 2008 -> May 2009.  > Commenced daily prednisone 5mg  daily 12/14/2007  s/p  Itraconazole 05/12/2008 >  10/07/2008 (only 1 flare Rx at home in 4/ 2010)   # PFTs 2007 -  FEV1 1.75/76%, TLC at 100%, DLCO 82% PFTs 12/04/2007 at end of pred burst - Fev1 1.7/68%, TLC 5.2/104%, DLCO 17.8/77% -> start daily pred Cleda Daub 02/01/2008  - FEv1 1.62/71%,  Spior 04/21/2008 - Fev1 1.35L/53% -> start itraconazole Spiro 08/01/2008 - Fev1 1.43L/54% ->  cont  itraconazole/daily pred Post itraconazole Cleda Daub 11/25/08 - Fev1 1.22L/48% (flare x 2) -> recommence pred & itra -> but she dc'ed itra due to chest pains.  Cleda Daub 01/08/09- Fev1 1.81L/72% (best ever) Cleda Daub 07/14/09 - Fev1 0.98L/36%, Rx a  # Obesity - BMI 30 > "secondary to flovent in 1996"  #) Maxillary Sinusitis 2007.Marland KitchenMarland KitchenDr Jearld Fenton  09/2008 - re-referred to ENT  # Depression  # Osteoarthritis / Osteopenia  - on fosmax   # IBS  # Hyperlipidemia  # Mod OSA by hx in 2007.  AHI 22.5 02/2606  Refused CPAP and sleep re-referral in 11/2007  # Pulmonary nodules - small stable nodules:2006 & summer 2007 ct chest. Not present at [facility] 11/2007.   #Hx of recurent Hives since highschool > resolved 04/2008 - 10/2008 while on itracconazole  for abpa   > recurred 6/10 while off itraconazole but attributed to vit d and flax seed on visit 12/2008 > Jan 2011 - attributed to qvar  Recorded: 10/28/2009 3:08 PM  Parameter  Measured Predicted %Predicted FVC     2.08        3.47        60 FEV1     1.22        2.74        44.70 FEV1%   58.75        79.76        73.70 PEF    2.95        6.63        44.40   Interpretation: improved  Current Medications (verified): 1)  Singulair 10 Mg  Tabs (Montelukast Sodium) .... Twice  Daily 2)  Proventil Hfa 108 (90  Base) Mcg/act Aers (Albuterol Sulfate) .... As Needed 3)  Effexor Xr 150 Mg  Cp24 (Venlafaxine Hcl) .Marland Kitchen.. 1 By Mouth Once Daily 4)  Fexofenadine Hcl 180 Mg Tabs (Fexofenadine Hcl) .... Take 1 By Mouth Once Daily 5)  Tussionex Pennkinetic Er 8-10 Mg/63ml Lqcr (Chlorpheniramine-Hydrocodone) .Marland Kitchen.. 1 Tsp By Mouth At Bedtime As Needed Cough 6)  Nebulizer Compressor  Misc (Nebulizers) .... Breathing Treatment As Needed 7)  Prednisone 10 Mg Tabs (Prednisone) .... Take 1 Tablet By Mouth Once A Day 8)  Alprazolam 0.5 Mg Tabs (Alprazolam) .... One Tablet By Mouth At Bedtime Prn 9)  Nasonex 50 Mcg/act Susp (Mometasone Furoate) .... 2 Squirts Each Nostril Daily 10)  Duoneb 0.5-2.5 (3) Mg/54ml Soln (Ipratropium-Albuterol) .... Take 4 Times Daily As Needed 11)  Spiriva Handihaler 18 Mcg Caps (Tiotropium Bromide Monohydrate) .... One Puff in The Morning 12)  Asmanex 120 Metered Doses 220 Mcg/inh Aepb (Mometasone Furoate) .... 2 Puffs in The Evening  Allergies (verified): 1)  ! Sulfa 2)  ! * Flovent 3)  ! * Symbicort 4)  ! * Dulera 5)  ! * Qvar  Past History:  Family History: Last updated: 12/11/2006 Family History Diabetes 1st degree relative Family History Other cancer-Breast  Social History: Last updated: 11/16/2007 Never Smoked Alcohol use-yes Drug use-no Married 1 son with cerebral palsy. Lot of aides at home for son  Risk Factors: Alcohol Use: <1 (08/19/2008) Caffeine Use: 3 (08/19/2008) Exercise: yes (12/15/2006)  Risk Factors: Smoking Status: never (10/21/2008) Passive Smoke Exposure: no (12/15/2006)  Past Medical History: # ABPA x 12/14/2007 .Marland KitchenMarland KitchenMarland KitchenRamaswamy  # ASTHMA x age 41 months. ....Marland KitchenMarland KitchenRamaswamy  # Obesity - BMI 30 > "secondary to flovent in 1996"  #) Maxillary Sinusitis 2007.Marland KitchenMarland KitchenDr Jearld Fenton  09/2008 - re-referred to ENT  # Depression  # Osteoarthritis / Osteopenia  - on fosmax   # IBS  # Hyperlipidemia  # Mod OSA by hx in 2007.  AHI 22.5 02/2606  Refused CPAP and sleep  re-referral in 11/2007  # Pulmonary nodules - small stable nodules:2006 & summer 2007 ct chest. Not present [facility] 11/[facility].   #Hx of recurent Hives since highschool > resolved 04/2008 - 10/2008 while on itracconazole for abpa   > recurred 6/10 while off itraconazole but attributed to vit d and flax seed on visit 12/2008 > Jan 2011 - attributed to qvar > feb 2011 - claims that in past symbicort, advair, dulera all cause hives  Past Surgical History: Reviewed history from 11/25/2008 and no changes required. Appendectomy Caesarean section Hx of normal stress test 10/2008  Past Pulmonary History:  Pulmonary History: 1)  ABPA x 12/14/2007  -> IgE 605 on 11/21/2007 (1 mo after steroid burst), Eos 10.4% of 5600 cells, Preciptin positive for fumigatus/nidulans/niger, Asp. skin test positive 11/21/2007, CT scan 2005 &  11/22/2007 unchanged bilateral apical scarring and RUL anterior segment bronchiectasis   2) ASTHMA x age 89 months. ....Marland KitchenMarland KitchenRamaswamy >Triggers: harsh winter and viruses Hx of subjective intolerance to ICS - "weight gain" >  Prior to singulair 02/1992 -> hospitalized 3-4 times/year. Since then, zero hospitalization/er visits.  > Improved asthma control after moving [place] in [place] . "Failed" xolair in 2004 per hx > 8 flares & prednisone bursts May 2008 -> May 2009.  > Commenced daily prednisone 5mg  daily 12/14/2007  > s/p  Itraconazole 05/12/2008 >  10/07/2008 (only 1 flare Rx at home in 4/ 2010) > 07/14/2009: AE asthma. Rx pred burst and QVAR  > 08/11/2009: start spiriva and asmanex >09/18/2009: AE asthma with self pred taper. Rx pred burst. Double up singulair at request   # PFTs 2007 -  FEV1 1.75/76%, TLC at 100%, DLCO 82% PFTs 12/04/2007 at end of pred burst - Fev1 1.7/68%, TLC 5.2/104%, DLCO 17.8/77% -> start daily pred Cleda Daub 02/01/2008  - FEv1 1.62/71%,  Spior 04/21/2008 - Fev1 1.35L/53% -> start itraconazole Cleda Daub 08/01/2008 - Fev1 1.43L/54% ->  cont  itraconazole/daily pred Post  itraconazole  11/25/08 - Fev1 1.22L/48% (flare x 2) -> recommence pred & itra -> but she dc'ed itra due to chest pains.  01/08/09- Fev1 1.81L/72% (best ever) 07/14/09 - Fev1 0.98L/36%, Started pred burst and QVAR -> dc qvar due to chst pain 08/11/09 - Fe1 1.1L/40% -> cont maint pred 10, restart asmanex, start spoiva 09/18/2009: - doubled up singulair at her request 10/28/2009  - Fev1 1.22L/44%  Family History: Reviewed history from 12/11/2006 and no changes required. Family History Diabetes 1st degree relative Family History Other cancer-Breast  Social History: Reviewed history from 11/16/2007 and no changes required. Never Smoked Alcohol use-yes Drug use-no Married 1 son with cerebral palsy. Lot of aides at home for son  Review of Systems       The patient complains of non-productive cough.  The patient denies shortness of breath with activity, shortness of breath at rest, productive cough, coughing up blood, chest pain, irregular heartbeats, acid heartburn, indigestion, loss of appetite, weight change, abdominal pain, difficulty swallowing, sore throat, tooth/dental problems, headaches, nasal congestion/difficulty breathing through nose, sneezing, itching, ear ache, anxiety, depression, hand/feet swelling, joint stiffness or pain, rash, change in color of mucus, and fever.    Vital Signs:  Patient profile:   55 year old female Height:      64 inches Weight:      172 pounds BMI:     29.63 O2 Sat:      100 % on Room air Temp:     97.9 degrees F oral Pulse rate:   90 / minute BP sitting:   120 / 78  (left arm) Cuff size:   regular  Vitals Entered By: Carron Curie CMA (Oct 28, 2009 3:03 PM)  O2 Flow:  Room air CC: Follow up, pt states few weeks ago she had to go on prednisone and a zpak, pt states she is better, pt states she needs refills on Duoneb bc pt states she lost the presciption and tussinex pennkinetic and singulair Comments Meds and allergies updated.  Carron Curie  CMA  Oct 28, 2009 3:05 PM    Physical Exam  General:  well developed, well nourished, in no acute  distressobese.   Head:  normocephalic and atraumatic Eyes:  PERRLA/EOM intact; conjunctiva and sclera clear Ears:  TMs intact and clear with normal canals Nose:  no deformity, discharge, inflammation, or lesions Mouth:  no deformity or lesions Neck:  no masses, thyromegaly, or abnormal cervical nodes Chest Wall:  no deformities noted Lungs:  clear bilaterally to auscultation and percussion Heart:  regular rate and rhythm, S1, S2 without murmurs, rubs, gallops, or clicks Abdomen:  bowel sounds positive; abdomen soft and non-tender without masses, or organomegaly Obese + Msk:  no deformity or scoliosis noted with normal posture Pulses:  pulses normal Extremities:  no clubbing, cyanosis, edema, or deformity noted Neurologic:  CN II-XII grossly intact with normal reflexes, coordination, muscle strength and tone Skin:  scattered macular urticarial rash in groin, axilla, thigh Cervical Nodes:  no significant adenopathy Axillary Nodes:  no significant adenopathy Psych:  alert and cooperative; normal mood and affect; normal attention span and concentration   Pulmonary Function Test Date: 10/28/2009 3:08 PM Gender: Female  Pre-Spirometry FVC    Value: 2.08 L/min   % Pred: 60 % FEV1    Value: 1.22 L     Pred: 2.74 L     % Pred: 44.70 % FEV1/FVC  Value: 58.75 %     % Pred: 73.70 %  Impression & Recommendations:  Problem # 1:  ASTHMA UNSPECIFIED WITH EXACERBATION (ICD-493.92) Assessment Improved resolved  Problem # 2:  ASTHMA, PERSISTENT, SEVERE (ICD-493.90) Assessment: Improved stable disease. very well controlled from subjective standpoint. Objectively still in severe persistent range byut improved from last visit. REcommend continue singulair two times a day (her own desire for this), asmanex (so far no hives on this), spiriva and daily oral pred for control. She will try to cut down  prednisone to 5mg  daily over next 5 months. I will see her in 6 months with spirometry  Problem # 3:  A B P A-ALLERGIC BRONCHOPULMONARY ASPERGILLOSIS (ICD-518.6)  Medications Added to Medication List This Visit: 1)  Singulair 10 Mg Tabs (Montelukast sodium) .... Twice  daily 2)  Spiriva Handihaler 18 Mcg Caps (Tiotropium bromide monohydrate) .... One puff in the morning 3)  Asmanex 120 Metered Doses 220 Mcg/inh Aepb (Mometasone furoate) .... 2 puffs in the evening  Other Orders: Prescription Created Electronically [id number]) Est. Patient Level III (60454)  Patient Instructions: 1)  continue current regimen of 2)  spiriva one puff daily 3)  asmanex 2 puff two times a day 4)  singulair 10mg  two times a day 5)  duoneb as needed 6)  prednisone 10mg   per day 7)  try to taper prednisone slowly down to 5mg  per day 8)  try to achieve 5mg  per day over 5 months 9)  return in 6 months  10)  return sooner if there are problems 11)  spirometry at followup Prescriptions: ASMANEX 120 METERED DOSES 220 MCG/INH AEPB (MOMETASONE FUROATE) 2 puffs in the evening  #3 x 1   Entered by:   Zackery Barefoot CMA   Authorized by:   Kalman Shan MD   Signed by:   Zackery Barefoot CMA on 10/28/2009   Method used:   Print then Give to Patient   RxID:   [id number] SPIRIVA HANDIHALER 18 MCG CAPS (TIOTROPIUM BROMIDE MONOHYDRATE) One puff in the morning  #3 x 1   Entered by:   Zackery Barefoot CMA   Authorized by:   Kalman Shan MD   Signed by:   Zackery Barefoot CMA on 10/28/2009   Method  used:   Print then Give to Patient   RxID:   [id number] DUONEB 0.5-2.5 (3) MG/3ML SOLN (IPRATROPIUM-ALBUTEROL) take 4 times daily as needed  #480 x 3   Entered by:   Zackery Barefoot CMA   Authorized by:   Kalman Shan MD   Signed by:   Zackery Barefoot CMA on 10/28/2009   Method used:   Print then Give to Patient   RxID:   [id number] NASONEX 50 MCG/ACT SUSP (MOMETASONE FUROATE) 2 squirts each  nostril daily  #3 x 3   Entered by:   Zackery Barefoot CMA   Authorized by:   Kalman Shan MD   Signed by:   Zackery Barefoot CMA on 10/28/2009   Method used:   Print then Give to Patient   RxID:   [id number] PROVENTIL HFA 108 (90 BASE) MCG/ACT AERS (ALBUTEROL SULFATE) as needed  #3 x 1   Entered by:   Zackery Barefoot CMA   Authorized by:   Kalman Shan MD   Signed by:   Zackery Barefoot CMA on 10/28/2009   Method used:   Print then Give to Patient   RxID:   [id number] SINGULAIR 10 MG  TABS (MONTELUKAST SODIUM) Twice  daily  #180 x 1   Entered by:   Zackery Barefoot CMA   Authorized by:   Kalman Shan MD   Signed by:   Zackery Barefoot CMA on 10/28/2009   Method used:   Print then Give to Patient   RxID:   [id number] SINGULAIR 10 MG  TABS (MONTELUKAST SODIUM) Twice  daily  #180 x 1   Entered and Authorized by:   Kalman Shan MD   Signed by:   Kalman Shan MD on 10/28/2009   Method used:   Print then Give to Patient   RxID:   [id number] ASMANEX 120 METERED DOSES 220 MCG/INH AEPB (MOMETASONE FUROATE) 2 puffs in the evening  #3 x 1   Entered and Authorized by:   Kalman Shan MD   Signed by:   Kalman Shan MD on 10/28/2009   Method used:   Print then Give to Patient   RxID:   [id number] SPIRIVA HANDIHALER 18 MCG CAPS (TIOTROPIUM BROMIDE MONOHYDRATE) One puff in the morning  #3 x 1   Entered and Authorized by:   Kalman Shan MD   Signed by:   Kalman Shan MD on 10/28/2009   Method used:   Print then Give to Patient   RxID:   [id number] DUONEB 0.5-2.5 (3) MG/3ML SOLN (IPRATROPIUM-ALBUTEROL) take 4 times daily as needed  #480 x 3   Entered and Authorized by:   Kalman Shan MD   Signed by:   Kalman Shan MD on 10/28/2009   Method used:   Print then Give to Patient   RxID:   [id number] NASONEX 50 MCG/ACT SUSP (MOMETASONE FUROATE) 2 squirts each nostril daily  #3 x 3   Entered and Authorized by:    Kalman Shan MD   Signed by:   Kalman Shan MD on 10/28/2009   Method used:   Print then Give to Patient   RxID:   [id number] PROVENTIL HFA 108 (90 BASE) MCG/ACT AERS (ALBUTEROL SULFATE) as needed  #3 x 1   Entered and Authorized by:   Kalman Shan MD   Signed by:   Kalman Shan MD on 10/28/2009   Method used:   Print then Give to Patient   RxID:   [id number]

## 2010-07-20 NOTE — Progress Notes (Signed)
Summary: rx for Prednisone   Phone Note Call from Patient Call back at Home Phone [phone]   Caller: Patient Call For: ramaswamy Reason for Call: Talk to Nurse Summary of Call: pt says prednisone rx reads 1 10mg  tab every other day, but she takes it every day.  Please advise Initial call taken by: Eugene Gavia,  October 07, 2009 9:00 AM  Follow-up for Phone Call        per 09/18/2009 phone note, pt was to taper down and then take 10mg  daily until her f/u appt with MR on 10/28/2009.  called and spoke with pt.  pt states she finished pred taper as directed on 09-18-2009 but now states she got her 90 day rx [facility] and directions on the bottle were to take every other day.  Pt requests new rx sent to [facility] with correct directions and quantity.  Will send rx to [facility]  Phone # [phone]    New/Updated Medications: PREDNISONE 10 MG TABS (PREDNISONE) Take 1 tablet by mouth once a day Prescriptions: PREDNISONE 10 MG TABS (PREDNISONE) Take 1 tablet by mouth once a day  #90 x 0   Entered by:   Arman Filter LPN   Authorized by:   Kalman Shan MD   Signed by:   Arman Filter LPN on 57/84/6962   Method used:   Faxed to ...       [address] [facility] (mail-order)       [address]       [place], [place]  [address]       Ph: [phone]       Fax: [phone]   RxID:   [id number]

## 2010-07-20 NOTE — Consult Note (Signed)
Summary: [facility]   Imported By: Lanelle Bal 02/02/2010 11:15:04  _____________________________________________________________________  External Attachment:    Type:   Image     Comment:   External Document

## 2010-07-20 NOTE — Miscellaneous (Signed)
[facility]   Imported By: Freddy Jaksch 11/02/2007 10:31:42  _____________________________________________________________________  External Attachment:    Type:   Image     Comment:   External Document

## 2010-07-20 NOTE — Progress Notes (Signed)
Summary: Lab Results   Phone Note Outgoing Call   Call placed by: Army Fossa CMA,  December 04, 2009 1:36 PM Reason for Call: Discuss lab or test results Summary of Call: Regarding lab results, LMTCB:   Ideally your LDL (bad cholesterol) should be <_100___, your HDL (good cholesterol) should be >___40 and your triglycerides should be< 150.  Diet and exercise will increase HDL and decrease the LDL and triglycerides. Read Dr. Danice Goltz book--Eat Drink and Be Healthy.  We will recheck labs in _3__ months.  Start Lipitor 10 mg #30 1 by mouth at bedtime , 2 refills----- give $4 coupon lipid, hep 272.4   Signed by [name] on 12/03/2009 at 10:20 PM   Follow-up for Phone Call        Pt is aware, also mailed pt a copy of labs. Army Fossa CMA  December 08, 2009 9:40 AM     New/Updated Medications: LIPITOR 10 MG TABS (ATORVASTATIN CALCIUM) 1 by mouth at bedtime. Prescriptions: LIPITOR 10 MG TABS (ATORVASTATIN CALCIUM) 1 by mouth at bedtime.  #90 x 0   Entered by:   Army Fossa CMA   Authorized by:   [name]   Signed by:   Army Fossa CMA on 12/08/2009   Method used:   Faxed to ...       [address] [facility] (mail-order)       [address]       [place], [place]  [address]       Ph: [phone]       Fax: [phone]   RxID:   [id number]

## 2010-07-20 NOTE — Progress Notes (Signed)
Summary: REFILL  Phone Note Refill Request Message from:  Pharmacy on [facility] [address] [phone]  Refills Requested: Medication #1:  EFFEXOR XR 150 MG  CP24 1 by mouth once daily Initial call taken by: Barb Merino,  January 01, 2009 3:02 PM    Prescriptions: EFFEXOR XR 150 MG  CP24 (VENLAFAXINE HCL) 1 by mouth once daily  #90 x 0   Entered by:   Kandice Hams   Authorized by:   [name]   Signed by:   Kandice Hams on 01/01/2009   Method used:   Faxed to ...       [address] [facility] (mail-order)       [address]       [place], [place]  [address]       Ph: [phone]       Fax: [phone]   RxID:   [id number]

## 2010-07-20 NOTE — Miscellaneous (Signed)
Summary: Orders Update pft charges  Clinical Lists Changes  Orders: Added new Service order of Carbon Monoxide diffusing w/capacity (94720) - Signed Added new Service order of Lung Volumes (94240) - Signed Added new Service order of Spirometry (Pre & Post) (94060) - Signed 

## 2010-07-20 NOTE — Medication Information (Signed)
Summary: Fexofenadine/[employer]  Fexofenadine/[facility]   Imported By: Sherian Rein 07/31/2009 08:23:31  _____________________________________________________________________  External Attachment:    Type:   Image     Comment:   External Document

## 2010-07-20 NOTE — Progress Notes (Signed)
Summary: lipitor refill   Phone Note Refill Request Message from:  Fax from Pharmacy on March 09, 2010 8:36 AM  Refills Requested: Medication #1:  LIPITOR 10 MG TABS 1 by mouth at bedtime. [employer] home delivery - fax [phone]  Initial call taken by: Okey Regal [place],  March 09, 2010 8:37 AM    Prescriptions: LIPITOR 10 MG TABS (ATORVASTATIN CALCIUM) 1 by mouth at bedtime.  #90 x 0   Entered by:   Doristine Devoid CMA   Authorized by:   [name]   Signed by:   Doristine Devoid CMA on 03/10/2010   Method used:   Faxed to ...       [address] [facility] (mail-order)       [address]       [place], [place]  [address]       Ph: [phone]       Fax: [phone]   RxID:   [id number]

## 2010-07-20 NOTE — Assessment & Plan Note (Signed)
Summary: 1 month w/spirometry/apc   Visit Type:  Follow-up Copy to:  [name] Primary Provider/Referring Provider:  [name]  CC:  Pt here for follow-up. Pt states breahting is doing "good"..  History of Present Illness: Moderate-Severe Persistent ASthma  and ABPA (stage 3 followiup). S/P 6 month itraconazole since 05/12/2008 -> 10/07/2008  OV 08/01/2008: Followup of her ABPA and Severe Persistent Asthma. Albuterol use is "Rare". Decreased her prednisone from 10mg  daily to 5mg  alternate day. Taking her symbicort only once a day.  REC: tape prednisone to 4mg  alternate day, cont itrconazole, cont symbi/singulair, return in 8 weeks to discuss stopping itraconazole.  Phone call 09/22/2008: AE-asthma. Rx at home with zpak and prednisone  OV 10/07/2008: Followup ABPA/ASthma. Feels well controlled. She is unhappy taking inhaled steroid due to weight gain;  blames ICS more than systemic. She has been taking servent as needed as stand alone without symbicort. Completed 6 months of itraconazole.  REC: DC itraconazole, DC symbicort per request, Cont Pred 4mg  alternate day and cont singulair  OV 11/25/2008: Followup ABPA/Asthma.  9 days after last visit, she had flare of asthma (after stopping itraconazole and dc of symbicort). She called in and we asked her to resume symbicort. She self directed her prednisone burst and she is now on 10mg  alternate day. After that symptoms improved. However past 2 weeks has had new HIVES. She now states that she had recurrent hives since highschool and the only time she was hive free was while she was on itraconazole (04/2008 - 09/2008). Describes hives in both axilla, left groin. No clear cut precipitating or relieving factors. Also, now wheezing is worse  - using albuterol >3times/day.  Fev1 48%. Currently taking singulair, symbicort, prednisone. She is awaiting [facility] evaluation for potential thermoplasty.REC: Pred burst, restart itraconazole for ABPA and hives, cont  singulair & symbi, refer [facility]  OV 01/08/2009. Followup for ASthma and ABPA. At last visit I had reinstitiutted ABPA because she has 2 flares; one at home and another at the last office visit. She was also stating that the her chronic hives seemed to respond to itraconazole during prior trial. However, a few days after starting itraconaole she discontinued it due to chest pains (although 10/2008 stress test for atypical pain was negative per her hx). The hives have not recurred since. She now feels that the hives were caused by flaxseed oild and VIt D Rx. Anyways, she did see [name] at [name] for thermoplasty eval. Workup is in progress. Per her hx, [name] has recommeneded she gradyally wean herself off prednisone completely. She was also started on ciclesonide in addition to symbicort in an effort to targe 'smaller airways'. She is doing better afer this regimen and feels well. In fact, spirimetry today is at BEST EVER -  Fev1 1.8L/72%.  She also had questioins about her sleep apnea. She had conversation with [name] about it and is now requesting a re-eval although she had declined it in past.   Current Medications (verified): 1)  Singulair 10 Mg  Tabs (Montelukast Sodium) .... Once Daily 2)  Proventil Hfa 108 (90 Base) Mcg/act Aers (Albuterol Sulfate) .... As Needed 3)  Effexor Xr 150 Mg  Cp24 (Venlafaxine Hcl) .Marland Kitchen.. 1 By Mouth Once Daily 4)  Fexofenadine Hcl 180 Mg Tabs (Fexofenadine Hcl) .... Take 1 By Mouth Once Daily 5)  Tussionex Pennkinetic Er 8-10 Mg/34ml Lqcr (Chlorpheniramine-Hydrocodone) .Marland Kitchen.. 1 Tsp By Mouth At Bedtime As Needed Cough 6)  Omeprazole 40 Mg Cpdr (Omeprazole) .Marland KitchenMarland KitchenMarland Kitchen  Take 1 Tablet By Mouth Once A Day 7)  Prednisone 5 Mg Tabs (Prednisone) .... Take 1 Tablet By Mouth Once A Day 8)  Symbicort 160-4.5 Mcg/act Aero (Budesonide-Formoterol Fumarate) .... Inhale 2 Puffs Twicw A Day  Allergies (verified): 1)  ! Sulfa 2)  ! * Flovent  Past History:  Past Medical History: 1) ABPA x  12/14/2007  -> IgE 605 on 11/21/2007 (1 month after steroid burst), Eosinophils 10.4% of 5600 cells, Preciptin positive for fumigatus/nidulans/niger, Asp. skin test positive 11/21/2007, CT scan 2005 &  11/22/2007 unchanged bilateral apical scarring and RUL anterior segment bronchiectasis   2) ASTHMA x age 53 months. .......Marland KitchenRamaswamy >Triggers: harsh winter and viruses Hx of subjective intolerance to ICS - "weight gain" >  Prior to singulair 02/1992 -> hospitalized 3-4 times/year. Since then, zero hospitalization/er visits.  > Improved asthma control after moving [place] -> [place] in 2004 . "Failed" xolair in 2004 per hx > 8 flares & prednisone bursts May 2008 -> May 2009.  > Commenced daily prednisone 5mg  daily 12/14/2007  s/p  Itraconazole 05/12/2008 >  10/07/2008 (only 1 flare Rx at home in April 2010)   # PFTs 2007 -  FEV1 1.75/76%, FVC 2.88, FEV1/FVC ratio  61%, TLC at 100%, DLCO -  82% PFTs 12/04/2007 at end of pred burst - Fev1 1.7/68%, TLC 5.2/104%, DLCO 17.8/77% -> start daily pred Cleda Daub 02/01/2008  - FEv1 1.62/71%,  Spior 04/21/2008 - Fev1 1.35L/53% -> start itraconazole Spiro 08/01/2008 - Fev1 1.43L/54% ->  cont  itraconazole/daily pred Sprio 10/07/08 - Not done -> dc itraconazole Cleda Daub 11/25/08 - Fev1 1.22L/48% (flare x 2) -> recommence pred & itra -> but she dc'ed itra due to chest pains.  Cleda Daub 7/22/1o- Fev1 1.81L/72% (best ever)  # Obesity - BMI 30 -> "secondary to flovent in 1996"  #) Maxillary Sinusitis 2007.Marland KitchenMarland KitchenDr Jearld [name]  09/2008 - re-referred to ENT  # Depression  # Osteoarthritis / Osteopenia  - on fosmax   # IBS  # Hyperlipidemia  # Mod OSA by hx in 2007.  AHI 22.5 02/2606 Not on CPAP. Refused sleep re-referral in 11/2007  # Pulmonary nodules - small stable nodules:2006 & summer 2007 ct chest. Not [facility].   #Hx of recurent Hives since highschool > resolved 04/2008 - 10/2008 while on itracconazole for abpa   > recurred 6/10 while off itraconazole but attributed to vit d  and flax seed on visit 12/2008  Family History: Reviewed history from 12/11/2006 and no changes required. Family History Diabetes 1st degree relative Family History Other cancer-Breast  Social History: Reviewed history from 11/16/2007 and no changes required. Never Smoked Alcohol use-yes Drug use-no Married 1 son with cerebral palsy. Lot of aides at home for son  Review of Systems  The patient denies anorexia, fever, weight loss, weight gain, vision loss, decreased hearing, hoarseness, chest pain, syncope, dyspnea on exertion, peripheral edema, prolonged cough, headaches, hemoptysis, abdominal pain, melena, hematochezia, severe indigestion/heartburn, hematuria, incontinence, genital sores, muscle weakness, suspicious skin lesions, transient blindness, difficulty walking, depression, unusual weight change, abnormal bleeding, enlarged lymph nodes, angioedema, breast masses, and testicular masses.         snores, early morning headaches, arousals at night  Vital Signs:  Patient profile:   55 year old female Height:      64 inches Weight:      179 pounds O2 Sat:      98 % on Room air Temp:     98.2 degrees F oral Pulse  rate:   91 / minute BP sitting:   118 / 74  (right arm) Cuff size:   regular  Vitals Entered By: Carron Curie CMA (January 08, 2009 10:58 AM)  O2 Flow:  Room air CC: Pt here for follow-up. Pt states breahting is doing "good". Comments Medications reviewed with patient Carron Curie CMA  January 08, 2009 11:01 AM    Physical Exam  General:  well developed, well nourished, in no acute distressobese.   Head:  normocephalic and atraumatic Eyes:  PERRLA/EOM intact; conjunctiva and sclera clear Ears:  TMs intact and clear with normal canals Nose:  no deformity, discharge, inflammation, or lesions Mouth:  no deformity or lesions Neck:  no masses, thyromegaly, or abnormal cervical nodes Chest Wall:  no deformities noted Lungs:  clear bilaterally to auscultation  and percussion Heart:  regular rate and rhythm, S1, S2 without murmurs, rubs, gallops, or clicks Abdomen:  bowel sounds positive; abdomen soft and non-tender without masses, or organomegaly Obese + Msk:  no deformity or scoliosis noted with normal posture Pulses:  pulses normal Extremities:  no clubbing, cyanosis, edema, or deformity noted Neurologic:  CN II-XII grossly intact with normal reflexes, coordination, muscle strength and tone Skin:  scattered macular urticarial rash in groin, axilla, thigh Cervical Nodes:  no significant adenopathy Axillary Nodes:  no significant adenopathy Psych:  alert and cooperative; normal mood and affect; normal attention span and concentration   Pulmonary Function Test Date: 01/08/2009 Gender: Female  Pre-Spirometry FVC    Value: 2.64 L/min   Pred: 3.28 L/min     % Pred: 80 % FEV1    Value: 1.81 L     Pred: 2.66 L     % Pred: 68 % FEV1/FVC  Value: 0.69 %     Pred: 0.81 %    FEF 25-75  Value: 1.09 L/min   Pred: 2.81 L/min     Comments: personally reviewd trace. This is best ever fev1  Evaluation: moderate obstruction with significant bronchodilator response  Impression & Recommendations:  Problem # 1:  IDIOPATHIC URTICARIA (ICD-708.1) Assessment Improved  ? cause. ? related to ABPA (per her this is related to ABPA). Very interesting that this resolved / was in remission while she was on itraconazle. However, now she attributes this to vit d and flaxseed. She wants to hold off derm rferral for now  plan reassess if these resolve with itraconazole and pred burst will need derm/allergy referral if it persists, recurs  Orders: Est. Patient Level IV (04540)  Problem # 2:  OBSTRUCTIVE SLEEP APNEA (ICD-780.57) Assessment: Unchanged  known prior osa. Now waiting re-eval. IT has been 3 years since prior pso  plan refer dr. Laurier Nancy sood  Orders: Est. Patient Level IV (98119)  Problem # 3:  ASTHMA, PERSISTENT, SEVERE (ICD-493.90) Assessment:  Improved Fev1 is best ever. It is likely that ciclesonide in addition to symbicort has made the diference. [facility] eval in progress.  plan will let [facility] Dr [name] manage for now I will provide suportive care refilled ciclesonide cont symbicort, singulair wean prednisone to off per [name] (will have t check with Dr [name] if this is okay in ABPA situation) okay not to take itraconazole per patient wishes - have asked her to check with DR [name]  Problem # 4:  A B P A-ALLERGIC BRONCHOPULMONARY ASPERGILLOSIS (ICD-518.6) Assessment: Improved see asthma sectioin  Medications Added to Medication List This Visit: 1)  Proventil Hfa 108 (90 Base) Mcg/act Aers (Albuterol sulfate) .... As needed  2)  Prednisone 5 Mg Tabs (Prednisone) .... Take 1 tablet by mouth once a day 3)  Alvesco 160 Mcg/act Aers (Ciclesonide) .... Take 2 puff bid  Patient Instructions: 1)  See Dr Craige Cotta for sleep apnea eval; he will decide on sleep study 2)  Continue the program and meds with [facility] 3)  Check with [name] whether your ABPA warrants you being on daly prednisone or not  4)  Have flu shot in fall 5)  REturn in 8 months 6)  Spirometry at foloowup Prescriptions: ALVESCO 160 MCG/ACT AERS (CICLESONIDE) take 2 puff bid  #3 x 1   Entered and Authorized by:   Kalman Shan MD   Signed by:   Kalman Shan MD on 01/08/2009   Method used:   Print then Give to Patient   RxID:   [id number]    SPIROMETRY RESULTS  Date:01/09/2008 height inches: 64 Gender: Female  Parameter  Measured Predicted %Predicted  FVC      2.64        3.28      80  FEV1      1.81        2.66      68  FEV1/FVC      0.69        0.81      85  FEF25-75      1.09        2.81      39  PEF      4.11        404      1  Post-Bronchodilator  Parameter Measured Change from Baseline  FVC               FEV1               FEV1/FVC                FEF25-75                PEF               Pulse Oximetry Pulse: 91 O2 Saturation %:  98

## 2010-07-20 NOTE — Progress Notes (Signed)
Summary: pain in legs  Phone Note Call from Patient   Reason for Call: Referral Summary of Call: pt  called back c/o of pain in leg and calf and that pain has increase since she saw you. pt states that she was seen in the ED for possible DVT. pt does not want to go into the weekend with these pain getting worse and want to know if you have any suggestion..............Marland KitchenFelecia Deloach CMA  August 22, 2008 4:51 PM  Follow-up for Phone Call        did she have doppler done?  result not in er note.  can call in pain meds for weekend and then ov Monday if no relief.   vicodines #30 1 by mouth every 6 hours as needed  Follow-up by: [name],  August 22, 2008 4:58 PM  Additional Follow-up for Phone Call Additional follow up Details #1::        doppler done in [place].  per dr Laury Axon instruction pt instructed to go to ed no record of doppler done may be a clot. pt ok will go to the ed in [place]................Marland KitchenFelecia Deloach CMA  August 22, 2008 5:31 PM    New/Updated Medications: VICODIN ES 7.5-750 MG TABS (HYDROCODONE-ACETAMINOPHEN) take 1 by mouth every 6 hours as needed   Prescriptions: VICODIN ES 7.5-750 MG TABS (HYDROCODONE-ACETAMINOPHEN) take 1 by mouth every 6 hours as needed  #30 x 0   Entered by:   Jeremy Johann CMA   Authorized by:   [name]   Signed by:   Jeremy Johann CMA on 08/22/2008   Method used:   Printed then faxed to ...       [facility]  [address] [id number] (retail)       [address]       [place], [place]  [address]       Ph: [phone] or [phone]       Fax: [phone]   RxID:   [id number]

## 2010-07-20 NOTE — Progress Notes (Signed)
Summary: FYI  Phone Note Outgoing Call   Call placed by: Almeta Monas CMA [facility]),  May 24, 2010 3:59 PM Call placed to: Patient Details for Reason: liver function elevated------  Is pt taking more tylenol or other otc meds?  Etoh?  recheck 2 weeks---790.4  hep, ggt,  fractionated alk phos,   Summary of Call: Pt was put on a new Asthma Medication named  Daliresp  and thinks liver functions were elevated because of the meds especially being that she had some other side effects from the medication. She stated she will discuss rest of the labs when she comes in on thursday and says she stopped Lipitor due to the leg pain. She stopped meds 3 weeks prior to labs and wants to discuss this as well when she comes in on thursday.   FYI Initial call taken by: Almeta Monas CMA [facility]),  May 24, 2010 4:02 PM

## 2010-07-20 NOTE — Assessment & Plan Note (Signed)
Summary: f/u pft/mh   Visit Type:  Follow-up Referred by:  [name] PCP:  [name]  Chief Complaint:  Followup on PFT's and labs and ct chest.  Pt denies any complaints today.Marland Kitchen  History of Present Illness: Followup Severe Asthma and ABPA workup. In interim, feels well. WE reviewe all workup so far. Results are reviewed in [facility]  BRIEF  I feel there is sufficient evidence to diagnose ABPA. IgE level 11/21/2007 is <1000 needed for diagnosis but ws done 1 month following a sterpoid burst. Criteria to make dx are: Asthma, skin test positive, peripheral eosinophil > 500, aspergillus precipitin positive, biapical fibrosis on CT chest.   She is ABPA - Stage 3 which is frequent exacerbtor wihtout fibrotic proigression (PFTs 2007 to 2009 and CT chest 2007 to 2009 show stability). Commenced maintenance prdnisone 5mg  daily 12/14/2007. Refused 2nd opinion at [facility]  CURRENT stable health. no abluterol use in > 2 weeks. No new compltainst. Has read a lot about ABPA and wants to start maintenance prednisone.       Updated Prior Medication List: SINGULAIR 10 MG  TABS (MONTELUKAST SODIUM) once daily ALBUTEROL   AERS (ALBUTEROL AERS)  DUONEB 2.5-0.5 MG/3ML  SOLN (ALBUTEROL-IPRATROPIUM)  EFFEXOR XR 150 MG  CP24 (VENLAFAXINE HCL) 1 by mouth once daily FOSAMAX 35 MG  TABS (ALENDRONATE SODIUM) One tablet weekly BENZONATATE 100 MG  CAPS (BENZONATATE) Take q6hrs for cough SEREVENT DISKUS 50 MCG/DOSE  AEPB (SALMETEROL XINAFOATE) once daily as needed ASMANEX 120 METERED DOSES 220 MCG/INH  AEPB (MOMETASONE FUROATE) once daily as needed PREDNISONE 5 MG  TABS (PREDNISONE) take one tablet daily  Current Allergies (reviewed today): ! SULFA ! * FLOVENT  Past Medical History:    1) ABPA - diagnosed 12/14/2007 - IgE 605 on 11/21/2007 (but 1 month after steroid burst), Eosinophils 10.4% of 5600 cells, Preciptin positive for fumigatus/nidulans/niger, Asp. skin test positive 11/21/2007, CT scan 11/22/2007 bilateral  apical scarring and RUL anterior segment bronchiectasis (unchanged from 2005)        Commeced prednisone 5mg  daily 12/14/2007        2) ASTHMA x since age 36 months. Severr winter <-25F and viral infections are typical triggers. Prior to 02/1992 - hospitaliaxed 3-4 times per year and 3-4 ytimes per year. Since, 02/2002 took singulair which has reduced hospitalization and er visits to zero. Tolerates asmanex inh steroid only. Subjectively feels inaled steroids do not help. Moved to [place] from [place], [place] in 2004 due to asthma due to the winter chill in [place], [place]. Since move to [place], [place] asthma is much better. Asthma is not made worse by the pollen, spring or summer heat/humdidity. Currently asthma triggers are sinus infections and viral infection. [place] winter does not bother asthma. Nevertheless, has had 8 course of prednisone between May 2008 - May 2009 but no er visits, no hospitalizations. Exacerbations trypiclaly treated with antiiotics and steroids            3) Hx of "ectremely high IgE levels" - fialed xolair therapy in 2004 per echart.             4) PFTs 2007 - Pulmonary function showed an FEV1 of 1.75/76%, FVC 2.88, FEV1/FVC ratio of 61%     of predicted. Total lung capacity at 100% predicated, DLCO -  82% predicted.          PFTs 12/04/2007 at end of pred burst - Fev1 1.7/68%, TLC 5.2/104%, DLCO 17.8/77%        5) Obesity -  BMI 30        6) Maxillary Sinusitsis 2007 - Rx by ENT        7) Depression        8) Osteoarthritis        9) Osteopenia (osteoporosis due to long term steroids  - with fosmaax it is now osteopenia)        10 ) IBS        11) Hyperlipidemia        12) Mod OSA by hx in 2007. Not on CPAP ? why. Refused sleep referral in June 2009   Family History:    Reviewed history from 12/11/2006 and no changes required:       Family History Diabetes 1st degree relative       Family History Other cancer-Breast  Social History:    Reviewed history from 11/16/2007 and no  changes required:       Never Smoked       Alcohol use-yes       Drug use-no       Married 1 son with cerebral palsy. Lot of aides at home for son   Risk Factors: Tobacco use:  never Passive smoke exposure:  no Drug use:  no Alcohol use:  yes Exercise:  yes    Times per week:  2    Type:  water  Seatbelt use:  100 %  Family History Risk Factors:    Family History of MI in females < 69 years old:  no    Family History of MI in males < 56 years old:  no   Review of Systems      See HPI   Vital Signs:  Patient Profile:   55 Years Old Female Height:     63.5 inches Weight:      170.25 pounds O2 Sat:      99 % O2 treatment:    Room Air Temp:     98.5 degrees F oral Pulse rate:   96 / minute BP sitting:   104 / 80  (left arm)  Vitals Entered By: Vernie Murders (December 14, 2007 1:45 PM)                 Physical Exam  General:     obese.   Head:     normocephalic and atraumatic Eyes:     PERRLA/EOM intact; conjunctiva and sclera clear Ears:     TMs intact and clear with normal canals Nose:     no deformity, discharge, inflammation, or lesions Mouth:     no deformity or lesions Neck:     no masses, thyromegaly, or abnormal cervical nodes Chest Wall:     no deformities noted Lungs:     clear bilaterally to auscultation and percussion Heart:     regular rate and rhythm, S1, S2 without murmurs, rubs, gallops, or clicks Abdomen:     bowel sounds positive; abdomen soft and non-tender without masses, or organomegaly Msk:     no deformity or scoliosis noted with normal posture Pulses:     pulses normal Extremities:     no clubbing, cyanosis, edema, or deformity noted Neurologic:     CN II-XII grossly intact with normal reflexes, coordination, muscle strength and tone Skin:     intact without lesions or rashes Cervical Nodes:     no significant adenopathy Axillary Nodes:     No palpable lymphadenopathy Psych:     alert and cooperative; normal mood and  affect; normal attention span and concentration      Impression & Recommendations:  Problem # 1:  A B P A-ALLERGIC BRONCHOPULMONARY ASPERGILLOSIS (ICD-518.6) Assessment: New Meets  6 of 8 criteria as outline. We discussed for 20 minutes on disease and expectations. Discussed mainatay treatment of prednisone and goal is to prvent fibrosis that she has escaped so far.  She prefers to start daily prednisone despite having side efects in past. She is aware of all side effects. WE discussed itraconazole. Advised only as steroid sparing agent and not as 1st line. She is in acceptance.  PLAN start low dose prednsione 5mg  daily to see if this alone is enough keeping exacerrbatin at bay.  Low dose approach to minimixe systemic side effects Return in 3 months IF exacrbating frequently wil need higher doses   Her updated medication list for this problem includes:    Prednisone 5 Mg Tabs (Prednisone) .Marland Kitchen... Take one tablet daily  Orders: Est. Patient Level V (16109)   Problem # 2:  OBSTRUCTIVE SLEEP APNEA (ICD-780.57) Assessment: Unchanged  She wanst to defere workup for this   Problem # 3:  PULMONARY NODULE (ICD-518.89) Assessment: Improved small stable nodules in 2006 fal and summer 2007 ct chest. Not present per report at [facility] 11/2007 ct chest. [facility] radiologist did not have access to compared ct chests as the older cts were done by [place] [facility]. In any event, no nodules now.  PLAN reassure  Orders: Est. Patient Level V (60454)   Problem # 4:  BRONCHIECTASIS (ICD-494.0)  RUL anterior segmen bronchiectasis in 2006 and 2007 CT chest but not mentioned by radiologists. It is also seen in 11/2007 CT chest done at [facility]. Dr. Katrinka Blazing the radiologist agreed to this presence. Is from ABPA  PLAN Monitor Her updated medication list for this problem includes:    Singulair 10 Mg Tabs (Montelukast sodium) ..... Once daily    Albuterol Aers (Albuterol aers)    Duoneb 2.5-0.5 Mg/67ml Soln  (Albuterol-ipratropium)    Serevent Diskus 50 Mcg/dose Aepb (Salmeterol xinafoate) ..... Once daily as needed    Asmanex 120 Metered Doses 220 Mcg/inh Aepb (Mometasone furoate) ..... Once daily as needed    Prednisone 5 Mg Tabs (Prednisone) .Marland Kitchen... Take one tablet daily  Orders: Est. Patient Level V (09811)   Problem # 5:  ASTHMA, PERSISTENT, SEVERE (ICD-493.90) see ABPA continue inahlers Her updated medication list for this problem includes:    Singulair 10 Mg Tabs (Montelukast sodium) ..... Once daily    Albuterol Aers (Albuterol aers)    Duoneb 2.5-0.5 Mg/57ml Soln (Albuterol-ipratropium)    Serevent Diskus 50 Mcg/dose Aepb (Salmeterol xinafoate) ..... Once daily as needed    Asmanex 120 Metered Doses 220 Mcg/inh Aepb (Mometasone furoate) ..... Once daily as needed    Prednisone 5 Mg Tabs (Prednisone) .Marland Kitchen... Take one tablet daily  Orders: Est. Patient Level V (91478)   Medications Added to Medication List This Visit: 1)  Prednisone 5 Mg Tabs (Prednisone) .... Take one tablet daily   Patient Instructions: 1)  You have ABPA - met 6 of 8 criteria 2)  STart prednisone 5mg  by mouth daily as instructed 3)  You are aware of steroid side effets 4)  Please return in 3 months to report progress 5)  continue all other inhalers   Prescriptions: PREDNISONE 5 MG  TABS (PREDNISONE) take one tablet daily  #30 x 6   Entered and Authorized by:   Kalman Shan MD   Signed by:  Kalman Shan MD on 12/14/2007   Method used:   Print then Give to Patient   RxID:   [id number]  ]

## 2010-07-20 NOTE — Progress Notes (Signed)
Summary: Triage- DX from General Surgeon  Phone Note Call from Patient Call back at Home Phone [phone]   Caller: Patient Summary of Call: 1.) Referred to general surgeon for abdominal hernia, patient was then later informed not a hernia but Rectus Diastasis Per Dr Bertram Savin. Patient was told there is noting to do about this and patient would like for Dr.Lowne to give her thoughts about this.  2.) At CP[facility] in June patient was told to start ASA-81mg , patient stopped due to brusing on arms and hand. Patient doesnt think she can tolerate ASA. Patient stated this is the second time she tried ASA, 1st time blood vessels in her eye burst. Patient would like this noted on her file and would like to let Dr.Lowne know  (FYI).  Dr.Lowne please advise on # 1 concern Initial call taken by: Shonna Chock CMA,  January 04, 2010 10:43 AM  Follow-up for Phone Call        If the surgeon said nothing needs to be done --- I would go with that---f/u with them if it starts to bother her Follow-up by: [name],  January 04, 2010 12:12 PM  Additional Follow-up for Phone Call Additional follow up Details #1::        DISCUSS WITH PATIENT ...Marland KitchenMarland KitchenFelecia Deloach CMA  January 04, 2010 1:47 PM

## 2010-07-20 NOTE — Assessment & Plan Note (Signed)
Summary: wheezing/ congestion with cough/ mbw   Visit Type:  Follow-up Copy to:  [name] Primary [name]/Referring Victoria [name]:  Victoria Freud DO  CC:  Pt her for follow-up. Pt c/o productive cough with yellow phlrgm x 3 days. Marland Kitchen  History of Present Illness: Severe persistent asthma (refuses to take ICS+LABA combo due to hives and weight gain but will take oral pred) and ABPA.    March 19, 2010: Last seen end FEb 2011 and May 2011. Since starting spiriva in Feb 2011 and doubling up singulair in April 2011 she states she has done well. Feels this is the longes she has gone without AE-Asthma. Feels these interventions have allowed her to wean prednisone gradually down to current dose of 5mg  daily. However, past week or so increased cough and yellow-green sputum. No increase in dyspnea and wheeze. 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 Does not feel she needs steroid burst. Feels she can manage with antibiotics. Paradoxically, spirometry today shows improvement to 1.4L/50%   Preventive Screening-Counseling & Management  Alcohol-Tobacco     Alcohol drinks/day: <1     Alcohol type: wine     Smoking Status: never     Passive Smoke Exposure: no  Current Medications (verified): 1)  Singulair 10 Mg  Tabs (Montelukast Sodium) .... Twice  Daily 2)  Proventil Hfa 108 (90 Base) Mcg/act Aers (Albuterol Sulfate) .... As Needed 3)  Effexor Xr 150 Mg  Cp24 (Venlafaxine Hcl) .Marland Kitchen.. 1 By Mouth Once Daily 4)  Fexofenadine Hcl 180 Mg Tabs (Fexofenadine Hcl) .... Take 1 By Mouth Once Daily 5)  Tussionex Pennkinetic Er 8-10 Mg/22ml Lqcr (Chlorpheniramine-Hydrocodone) .Marland Kitchen.. 1 Tsp By Mouth At Bedtime As Needed Cough 6)  Nebulizer Compressor  Misc (Nebulizers) .... Breathing Treatment As Needed 7)  Prednisone 5 Mg Tabs (Prednisone) .... Take 1 Tablet By Mouth Once A Day 8)  Alprazolam 0.5 Mg Tabs (Alprazolam) .... One Tablet By Mouth At Bedtime Prn 9)  Nasonex 50 Mcg/act Susp (Mometasone Furoate) .... 2 Squirts Each  Nostril Daily 10)  Spiriva Handihaler 18 Mcg Caps (Tiotropium Bromide Monohydrate) .... One Puff in The Morning 11)  Asmanex 120 Metered Doses 220 Mcg/inh Aepb (Mometasone Furoate) .... 2 Puffs in The Evening 12)  Prednisone 1 Mg Tabs (Prednisone) .... Take As Directed 13)  Shaklee Osteomatrix -- Ca, Mg, Vita D .... 2 By Mouth Two Times A Day 14)  Mega Red---Omega 3 .Marland Kitchen.. 1 By Mouth Once Daily 15)  Tessalon Perles 100 Mg Caps (Benzonatate) .Marland Kitchen.. 1 By Mouth Three Times A Day As Needed 16)  Lipitor 10 Mg Tabs (Atorvastatin Calcium) .Marland Kitchen.. 1 By Mouth At Bedtime.  Allergies (verified): 1)  ! Sulfa 2)  ! * Flovent 3)  ! * Symbicort 4)  ! * Dulera 5)  ! * Qvar 6)  ! * Proventil Hfa  Past History:  Past medical, surgical, family and social histories (including risk factors) reviewed, and no changes noted (except as noted below).  Past Medical History: # ABPA x 12/14/2007 .Marland KitchenMarland KitchenMarland KitchenRamaswamy  # ASTHMA x age 68 months. ....Marland KitchenMarland KitchenRamaswamy  # Obesity - BMI 30 > "secondary to flovent in 1996"  #) Maxillary Sinusitis 2007.Marland KitchenMarland KitchenDr Jearld Fenton    - 09/2008 - re-referred to ENT  # Depression  # Osteoarthritis / Osteopenia  - on fosmax   # IBS  # Hyperlipidemia  # Mod OSA by hx in 2007.  AHI 22.5 02/2606  Refused CPAP and sleep re-referral in 11/2007  # Pulmonary nodules - small stable nodules:2006 & summer 2007  ct chest. Not present at [facility] 11/2007.   #Hx of recurent Hives since highschool   - resolved 04/2008 - 10/2008 while on itracconazole for abpa    -recurred 6/10 while off itraconazole but attributed to vit d and flax seed on visit 12/2008  - Jan 2011 - attributed to qvar  - feb 2011 - hx that in past symbicort, advair, dulera all cause hives  - Sept 2011 - hx of hives with ventolin HFA but not with pro-air  Past Surgical History: Reviewed history from 11/25/2008 and no changes required. Appendectomy Caesarean section Hx of normal stress test 10/2008  Past Pulmonary History:  Pulmonary  History: 1) ABPA x 12/14/2007   - IgE 605 on 11/21/2007 (1 mo after steroid burst), Eos 10.4% of 5600 cells, Preciptin positive for fumigatus/nidulans/niger, Asp. skin test positive 11/21/2007, CT scan 2005 &  11/22/2007 unchanged bilateral apical scarring and RUL anterior segment bronchiectasis   2) ASTHMA x age 20 months. ....Marland KitchenMarland KitchenRamaswamy  -Triggers: harsh winter and viruses Hx of subjective intolerance to ICS - "weight gain"  -  Prior to singulair 02/1992 -> hospitalized 3-4 times/year. Since then, zero hospitalization/er visits.   - Improved asthma control after moving [place] -> [name] in [place] . "Failed" xolair in 2004 per hx  -8 flares & prednisone bursts May 2008 -> May 2009.    - Commenced daily prednisone 5mg  daily 12/14/2007   - s/p  Itraconazole 05/12/2008 >  10/07/2008 (only 1 flare Rx at home in 4/ 2010)  - 07/14/2009: AE asthma. Rx pred burst and QVAR   - 08/11/2009: start spiriva and asmanex  -09/18/2009: AE asthma with self pred taper. Rx pred burst. Double up singulair at request   # PFTs 2007 -  FEV1 1.75/76%, TLC at 100%, DLCO 82% PFTs 12/04/2007 at end of pred burst - Fev1 1.7/68%, TLC 5.2/104%, DLCO 17.8/77% -> start daily pred [place] [name] 02/01/2008  - FEv1 1.62/71%,  Spior 04/21/2008 - Fev1 1.35L/53% -> start itraconazole [place] [name] 08/01/2008 - Fev1 1.43L/54% ->  cont  itraconazole/daily pred Post itraconazole  11/25/08 - Fev1 1.22L/48% (flare x 2) -> recommence pred & itra -> but she dc'ed itra due to chest pains.  01/08/09- Fev1 1.81L/72% (best ever) 07/14/09 - Fev1 0.98L/36%, Started pred burst and QVAR -> dc qvar due to chst pain 08/11/09 - Fe1 1.1L/40% -> cont maint pred 10, restart asmanex, start spoiva 09/18/2009: - doubled up singulair at her request 10/28/2009  - Fev1 1.22L/44% 03/19/2010 - Fev1 1.4L/50% -> abx for mild AE. Add roflumilast  Family History: Reviewed history from 12/11/2006 and no changes required. Family History Diabetes 1st degree relative Family History Other  cancer-Breast  Social History: Reviewed history from 11/16/2007 and no changes required. Never Smoked Alcohol use-yes Drug use-no Married 1 son with cerebral palsy. Lot of aides at home for son  Review of Systems       The patient complains of shortness of breath with activity, productive cough, and change in color of mucus.  The patient denies shortness of breath at rest, non-productive cough, coughing up blood, chest pain, irregular heartbeats, acid heartburn, indigestion, loss of appetite, weight change, abdominal pain, difficulty swallowing, sore throat, tooth/dental problems, headaches, nasal congestion/difficulty breathing through nose, sneezing, itching, ear ache, anxiety, depression, hand/feet swelling, joint stiffness or pain, rash, and fever.         wheezing  Vital Signs:  Patient profile:   55 year old female Height:      64 inches Weight:  172.38 pounds BMI:     29.70 O2 Sat:      98 % on Room air Temp:     98.5 degrees F oral Pulse rate:   94 / minute BP sitting:   118 / 72  (right arm) Cuff size:   regular  Vitals Entered By: Carron Curie CMA (March 19, 2010 10:56 AM)  O2 Flow:  Room air CC: Pt her for follow-up. Pt c/o productive cough with yellow phlrgm x 3 days.  Comments Medications reviewed with patient Carron Curie CMA  March 19, 2010 10:58 AM Daytime phone number verified with patient.    Physical Exam  General:  well developed, well nourished, in no acute distressobese.   Head:  normocephalic and atraumatic Eyes:  PERRLA/EOM intact; conjunctiva and sclera clear Ears:  TMs intact and clear with normal canals Nose:  no deformity, discharge, inflammation, or lesions Mouth:  no deformity or lesions Neck:  no masses, thyromegaly, or abnormal cervical nodes Chest Wall:  no deformities noted Lungs:  clear bilaterally to auscultation and percussion Heart:  regular rate and rhythm, S1, S2 without murmurs, rubs, gallops, or  clicks Abdomen:  bowel sounds positive; abdomen soft and non-tender without masses, or organomegaly Obese + Msk:  no deformity or scoliosis noted with normal posture Pulses:  pulses normal Extremities:  no clubbing, cyanosis, edema, or deformity noted Neurologic:  CN II-XII grossly intact with normal reflexes, coordination, muscle strength and tone Skin:  scattered macular urticarial rash in groin, axilla, thigh Cervical Nodes:  no significant adenopathy Axillary Nodes:  no significant adenopathy Psych:  alert and cooperative; normal mood and affect; normal attention span and concentration   Pre-Spirometry FEV1    Value: 1.4L L     % Pred: 50% %  Evaluation: severe obstruction with significant bronchodilator response  Impression & Recommendations:  Problem # 1:  ASTHMA UNSPECIFIED WITH EXACERBATION (ICD-493.92) Assessment Deteriorated In AE-asthma again but appears mild-moderate. Wil Rx with doxycycline. She is not interested in prednisone taper  Problem # 2:  ASTHMA, PERSISTENT, SEVERE (ICD-493.90) Assessment: Improved despite current mild AE-Asthma, fev1 has improved since feb 2011 with additon of spiriva in feb 2011 and doubling of singulair in april 2011. She feels she has "chronic bronchitis" and feels spiriva is doing wonders for her. We discussed ROFLUMILAST to decreased exacerbation frequency. Emphasized this is a COPD drug. Explaiend side effects of pshcyiatric, diarrhea, LFTs. She is VERY KEEN on trying this out despite  warnings. I have sent a script. Will see her in 3 months with spiro at fuWill also check ALPHA 1 ANTITRYPSIN per ATS guidelines  Medications Added to Medication List This Visit: 1)  Prednisone 5 Mg Tabs (Prednisone) .... Take 1 tablet by mouth once a day 2)  Daliresp 500 Mcg Tabs (Roflumilast) .... Take once daily, oral 3)  Doxycycline Monohydrate 100 Mg Caps (Doxycycline monohydrate) .... By mouth twice daily after meals  Other Orders: T- * Misc.  Laboratory test [phone]) Est. Patient Level IV (40102)  Patient Instructions: 1)  please take doxycycline 100mg  by mouth two times a day x 5 days 2)  take it after meals 3)  if you get worse call or go to er for steroid burst consideration 4)  start roflumilast 1 tablet daily  5)   - unsure if this will work 6)   - if any side effects let us know 7)   - if expensive let us know 8)  return in 3 months with spirometry at followup  9)  give blood for alpha 1 antitrypsin - genetic tes uncontrolled asthma 10)  have you had flu shot  - if not have one after current attack Prescriptions: DOXYCYCLINE MONOHYDRATE 100 MG  CAPS (DOXYCYCLINE MONOHYDRATE) By mouth twice daily after meals  #10 x 0   Entered and Authorized by:   Kalman Shan MD   Signed by:   Kalman Shan MD on 03/19/2010   Method used:   Electronically to        [facility]  [address] [id number] (retail)       [address]       [place], [place]  [address]       Ph: [phone] or [phone]       Fax: [phone]   RxID:   [id number] DALIRESP 500 MCG TABS (ROFLUMILAST) take once daily, oral  #30 x 3   Entered and Authorized by:   Kalman Shan MD   Signed by:   Kalman Shan MD on 03/19/2010   Method used:   Electronically to        [facility]  [address] [id number] (retail)       [address]       [place], [place]  [address]       Ph: [phone] or [phone]       Fax: [phone]   RxID:   [id number]

## 2010-07-20 NOTE — Assessment & Plan Note (Signed)
Summary: f/u ///kp   Visit Type:  Follow-up Copy to:  [name] Primary Provider/Referring Provider:  [name]  CC:  Pt here for 8 month follow-up.  Pt c/o sinus pressure, sinus headache, and nasal congestion x 1 week. pt has been using advil cold and sinus as well as nasonex with no relief.  Pt needes refill on several meds. .  History of Present Illness: OV 07/14/2009. Followup ABPA/ASthma. I am seeing [name] after long time. Last visit in July 2010. Since then she was following at [facility] with [name]. Sent there for thermoplasty evaluation and they added ciclesonide to her regimen and were able to optimize lung function further. THey recommended derm eval but she states that she got tired of too many consultants and wont go to [facility] anymore. Currently, she is off all her inahler Rx for asthma. She is convinced inhalers result in weight gain. She is is having worsening cough, wheeze c/w asthma attack. Using albuterol more frequently for rescue. Waking up at night. Sinus congestion is preciptiating factor. Associated periodic chronic hives still present.  No fever. Rates symptoms as moderate to severe  CardioPerfect Spirometry  ID: [id number] Patient: Victoria [name], Victoria [name] DOB: 11/27/1955 Age: 55 Years Old Sex: Female Race: White Physician: [name] Height: 64 Weight: 178.50 Status: Unconfirmed Past Medical History:  1) ABPA x 12/14/2007  -> IgE 605 on 11/21/2007 (1 month after steroid burst), Eosinophils 10.4% of 5600 cells, Preciptin positive for fumigatus/nidulans/niger, Asp. skin test positive 11/21/2007, CT scan 2005 &  11/22/2007 unchanged bilateral apical scarring and RUL anterior segment bronchiectasis   2) ASTHMA x age 18 months. .......Marland KitchenRamaswamy >Triggers: harsh winter and viruses Hx of subjective intolerance to ICS - "weight gain" >  Prior to singulair 02/1992 -> hospitalized 3-4 times/year. Since then, zero hospitalization/er visits.  > Improved asthma control [place]  [place] . "Failed" xolair in 2004 per hx > 8 flares & prednisone bursts May 2008 -> May 2009.  > Commenced daily prednisone 5mg  daily 12/14/2007  s/p  Itraconazole 05/12/2008 >  10/07/2008 (only 1 flare Rx at home in April 2010)   # PFTs 2007 -  FEV1 1.75/76%, FVC 2.88, FEV1/FVC ratio  61%, TLC at 100%, DLCO -  82% PFTs 12/04/2007 at end of pred burst - Fev1 1.7/68%, TLC 5.2/104%, DLCO 17.8/77% -> start daily pred Cleda Daub 02/01/2008  - FEv1 1.62/71%,  Spior 04/21/2008 - Fev1 1.35L/53% -> start itraconazole Spiro 08/01/2008 - Fev1 1.43L/54% ->  cont  itraconazole/daily pred Sprio 10/07/08 - Not done -> dc itraconazole Cleda Daub 11/25/08 - Fev1 1.22L/48% (flare x 2) -> recommence pred & itra -> but she dc'ed itra due to chest pains.  Cleda Daub 7/22/1o- Fev1 1.81L/72% (best ever)  # Obesity - BMI 30 -> "secondary to flovent in 1996"  #) Maxillary Sinusitis 2007.Marland KitchenMarland KitchenDr Jearld Fenton  09/2008 - re-referred to ENT  # Depression  # Osteoarthritis / Osteopenia  - on fosmax   # IBS  # Hyperlipidemia  # Mod OSA by hx in 2007.  AHI 22.5 02/2606 Not on CPAP. Refused sleep re-referral in 11/2007  # Pulmonary nodules - small stable nodules:2006 & summer 2007 ct chest. Not present [facility] 11/2007.   #Hx of recurent Hives since highschool > resolved 04/2008 - 10/2008 while on itracconazole for abpa   > recurred 6/10 while off itraconazole but attributed to vit d and flax seed on visit 12/2008  Recorded: 07/14/2009 11:38 AM  Parameter  Measured Predicted %  Predicted FVC     1.78        3.47        51.20 FEV1     0.98        2.74        35.70 FEV1%   54.93        79.76        68.90 PEF    2.27        6.63        34.30   Interpretation: severe obstruction  Current Medications (verified): 1)  Singulair 10 Mg  Tabs (Montelukast Sodium) .... Once Daily 2)  Proventil Hfa 108 (90 Base) Mcg/act Aers (Albuterol Sulfate) .... As Needed 3)  Effexor Xr 150 Mg  Cp24 (Venlafaxine Hcl) .Marland Kitchen.. 1 By Mouth Once Daily 4)   Fexofenadine Hcl 180 Mg Tabs (Fexofenadine Hcl) .... Take 1 By Mouth Once Daily 5)  Tussionex Pennkinetic Er 8-10 Mg/18ml Lqcr (Chlorpheniramine-Hydrocodone) .Marland Kitchen.. 1 Tsp By Mouth At Bedtime As Needed Cough 6)  Omeprazole 40 Mg Cpdr (Omeprazole) .... Take 1 Tablet By Mouth Once A Day 7)  Tussionex Pennkinetic Er 8-10 Mg/57ml Lqcr (Chlorpheniramine-Hydrocodone) .Marland Kitchen.. 1 Tsp By Mouth At Bedtime As Needed 8)  Nebulizer Compressor  Misc (Nebulizers) .... Breathing Treatment As Needed 9)  Prednisone 10 Mg Tabs (Prednisone) .... Take As Directed 10)  Alprazolam 0.5 Mg Tabs (Alprazolam) .... One Tablet By Mouth At Bedtime Prn 11)  Zithromax Z-Pak 250 Mg Tabs (Azithromycin) .... As Directed 12)  Nasonex 50 Mcg/act Susp (Mometasone Furoate) .... 2 Squirts Each Nostril Daily  Allergies: 1)  ! Sulfa 2)  ! * Flovent 3)  ! * Symbicort 4)  ! Elwin Sleight  Past History:  Past Surgical History: Last updated: 11/25/2008 Appendectomy Caesarean section Hx of normal stress test 10/2008  Family History: Last updated: 12/11/2006 Family History Diabetes 1st degree relative Family History Other cancer-Breast  Social History: Last updated: 11/16/2007 Never Smoked Alcohol use-yes Drug use-no Married 1 son with cerebral palsy. Lot of aides at home for son  Risk Factors: Alcohol Use: <1 (08/19/2008) Caffeine Use: 3 (08/19/2008) Exercise: yes (12/15/2006)  Risk Factors: Smoking Status: never (10/21/2008) Passive Smoke Exposure: no (12/15/2006)  Past Medical History: 1) ABPA x 12/14/2007  -> IgE 605 on 11/21/2007 (1 month after steroid burst), Eosinophils 10.4% of 5600 cells, Preciptin positive for fumigatus/nidulans/niger, Asp. skin test positive 11/21/2007, CT scan 2005 &  11/22/2007 unchanged bilateral apical scarring and RUL anterior segment bronchiectasis   2) ASTHMA x age 47 months. .......Marland KitchenRamaswamy >Triggers: harsh winter and viruses Hx of subjective intolerance to ICS - "weight gain" >  Prior to singulair  02/1992 -> hospitalized 3-4 times/year. Since then, zero hospitalization/er visits.  > Improved asthma control after moving [place] in 2004 . "Failed" xolair in 2004 per hx > 8 flares & prednisone bursts May 2008 -> May 2009.  > Commenced daily prednisone 5mg  daily 12/14/2007  s/p  Itraconazole 05/12/2008 >  10/07/2008 (only 1 flare Rx at home in April 2010)   # PFTs 2007 -  FEV1 1.75/76%, FVC 2.88, FEV1/FVC ratio  61%, TLC at 100%, DLCO 82% PFTs 12/04/2007 at end of pred burst - Fev1 1.7/68%, TLC 5.2/104%, DLCO 17.8/77% -> start daily pred Cleda Daub 02/01/2008  - FEv1 1.62/71%,  Spior 04/21/2008 - Fev1 1.35L/53% -> start itraconazole Spiro 08/01/2008 - Fev1 1.43L/54% ->  cont  itraconazole/daily pred Sprio 10/07/08 - Not done -> dc itraconazole Cleda Daub 11/25/08 - Fev1 1.22L/48% (flare x 2) ->  recommence pred & itra -> but she dc'ed itra due to chest pains.  Cleda Daub 01/08/09- Fev1 1.81L/72% (best ever) Cleda Daub 1  # Obesity - BMI 30 -> "secondary to flovent in 1996"  #) Maxillary Sinusitis 2007.Marland KitchenMarland KitchenDr Jearld Fenton  09/2008 - re-referred to ENT  # Depression  # Osteoarthritis / Osteopenia  - on fosmax   # IBS  # Hyperlipidemia  # Mod OSA by hx in 2007.  AHI 22.5 02/2606 Not on CPAP. Refused sleep re-referral in 11/2007  # Pulmonary nodules - small stable nodules:2006 & summer 2007 ct chest. Not [facility] 11/[facility].   #Hx of recurent Hives since highschool > resolved 04/2008 - 10/2008 while on itracconazole for abpa   > recurred 6/10 while off itraconazole but attributed to vit d and flax seed on visit 12/2008  Family History: Reviewed history from 12/11/2006 and no changes required. Family History Diabetes 1st degree relative Family History Other cancer-Breast  Social History: Reviewed history from 11/16/2007 and no changes required. Never Smoked Alcohol use-yes Drug use-no Married 1 son with cerebral palsy. Lot of aides at home for son  Review of Systems       The patient complains of  headaches and nasal congestion/difficulty breathing through nose.  The patient denies shortness of breath with activity, shortness of breath at rest, productive cough, non-productive cough, coughing up blood, chest pain, irregular heartbeats, acid heartburn, indigestion, loss of appetite, weight change, abdominal pain, difficulty swallowing, sore throat, tooth/dental problems, sneezing, itching, ear ache, anxiety, depression, hand/feet swelling, joint stiffness or pain, rash, change in color of mucus, and fever.    Vital Signs:  Patient profile:   55 year old female Height:      64 inches Weight:      178.50 pounds O2 Sat:      97 % on Room air Temp:     98.0 degrees F oral Pulse rate:   75 / minute BP sitting:   128 / 84  (right arm) Cuff size:   regular  Vitals Entered By: Carron Curie CMA (July 14, 2009 11:33 AM)  O2 Flow:  Room air CC: Pt here for 8 month follow-up.  Pt c/o sinus pressure, sinus headache, nasal congestion x 1 week. pt has been using advil cold and sinus as well as nasonex with no relief.  Pt needes refill on several meds.  Comments Medications reviewed with patient Carron Curie CMA  July 14, 2009 11:44 AM Daytime phone number verified with patient.    Physical Exam  General:  well developed, well nourished, in no acute distressobese.   Head:  normocephalic and atraumatic Eyes:  PERRLA/EOM intact; conjunctiva and sclera clear Ears:  TMs intact and clear with normal canals Nose:  no deformity, discharge, inflammation, or lesions Mouth:  no deformity or lesions Neck:  no masses, thyromegaly, or abnormal cervical nodes Chest Wall:  no deformities noted Lungs:  clear bilaterally to auscultation and percussion Heart:  regular rate and rhythm, S1, S2 without murmurs, rubs, gallops, or clicks Abdomen:  bowel sounds positive; abdomen soft and non-tender without masses, or organomegaly Obese + Msk:  no deformity or scoliosis noted with normal  posture Pulses:  pulses normal Extremities:  no clubbing, cyanosis, edema, or deformity noted Neurologic:  CN II-XII grossly intact with normal reflexes, coordination, muscle strength and tone Skin:  scattered macular urticarial rash in groin, axilla, thigh Cervical Nodes:  no significant adenopathy Axillary Nodes:  no significant adenopathy Psych:  alert and cooperative; normal  mood and affect; normal attention span and concentration   Pulmonary Function Test Date: 07/14/2009 11:38 AM Gender: Female  Pre-Spirometry FVC    Value: 1.78 L/min   % Pred: 51.20 % FEV1    Value: 0.98 L     Pred: 2.74 L     % Pred: 35.70 % FEV1/FVC  Value: 54.93 %     % Pred: 68.90 %  Evaluation: severe obstruction with NO significant bronchodilator response  Recommendations: personall viewed trace  Impression & Recommendations:  Problem # 1:  ASTHMA UNSPECIFIED WITH EXACERBATION (ICD-493.92) Assessment New She is in middle of an asthma exacerbation. Clnically does not look like she needs admission. Cause is not using inhaler Rx and sinus infection.   plan antibiotic pred burst and taper to maintenance of 10mg  daily will start inhaler steroid - she has agreed to QVAR after much discusson  Medications Added to Medication List This Visit: 1)  Nasonex 50 Mcg/act Susp (Mometasone furoate) .... 2 squirts each nostril daily 2)  Qvar 80 Mcg/act Aers (Beclomethasone dipropionate) .... Two  puffs twice daily 3)  Prednisone 10 Mg Tabs (Prednisone) .... 6 tabs daily x3 days, then 4 tabs daily x3 days, then 3 tabs daily x 3 days, then 2 tabs daily x3 days, then continue old regimen 4)  Cefdinir 300 Mg Caps (Cefdinir) .... Take 1 cap two times a day 5)  Duoneb 0.5-2.5 (3) Mg/62ml Soln (Ipratropium-albuterol) .... Take 4 times daily as needed  Other Orders: Est. Patient Level III (01601)  Patient Instructions: 1)  you are in the middle of an asthma exacerbation 2)  take qvar 2 puff two times a day 3)   take prednisone burst for 12 days and then continue at 10mg  daily 4)  take omnicef for7 days  5)  continue ohter medicines 6)  return in 1 month 7)  spirometry at return Prescriptions: DUONEB 0.5-2.5 (3) MG/3ML SOLN (IPRATROPIUM-ALBUTEROL) take 4 times daily as needed  #480 x 3   Entered and Authorized by:   Kalman Shan MD   Signed by:   Kalman Shan MD on 07/14/2009   Method used:   Print then Give to Patient   RxID:   [id number] NASONEX 50 MCG/ACT SUSP (MOMETASONE FUROATE) 2 squirts each nostril daily  #3 x 3   Entered and Authorized by:   Kalman Shan MD   Signed by:   Kalman Shan MD on 07/14/2009   Method used:   Print then Give to Patient   RxID:   [id number] OMEPRAZOLE 40 MG CPDR (OMEPRAZOLE) Take 1 tablet by mouth once a day  #90 x 3   Entered and Authorized by:   Kalman Shan MD   Signed by:   Kalman Shan MD on 07/14/2009   Method used:   Print then Give to Patient   RxID:   [id number] PROVENTIL HFA 108 (90 BASE) MCG/ACT AERS (ALBUTEROL SULFATE) as needed  #3 x 1   Entered and Authorized by:   Kalman Shan MD   Signed by:   Kalman Shan MD on 07/14/2009   Method used:   Print then Give to Patient   RxID:   [id number] SINGULAIR 10 MG  TABS (MONTELUKAST SODIUM) once daily  #90 x 1   Entered and Authorized by:   Kalman Shan MD   Signed by:   Kalman Shan MD on 07/14/2009   Method used:   Print then Give to Patient   RxID:   [id number] CEFDINIR 300 MG CAPS (CEFDINIR)  take 1 cap two times a day  #14 x 0   Entered and Authorized by:   Kalman Shan MD   Signed by:   Kalman Shan MD on 07/14/2009   Method used:   Electronically to        [facility]  [address] [id number] ([facility])       [address]       [place], [place]  [address]       Ph: [phone] or [phone]       Fax: [phone]   RxID:   [id number] PREDNISONE 10 MG  TABS (PREDNISONE) 6 tabs daily x3 days, then 4  tabs daily x3 days, then 3 tabs daily x 3 days, then 2 tabs daily x3 days, then continue old regimen  #45 x 0   Entered and Authorized by:   Kalman Shan MD   Signed by:   Kalman Shan MD on 07/14/2009   Method used:   Electronically to        [facility] [id number] ([facility]       [address]       [place], [place]  [address]       Ph: [phone] or [phone]       Fax: [phone]   RxID:   [id number] QVAR 80 MCG/ACT  AERS (BECLOMETHASONE DIPROPIONATE) Two  puffs twice daily  #1 x 6   Entered and Authorized by:   Kalman Shan MD   Signed by:   Kalman Shan MD on 07/14/2009   Method used:   [facility]ically to        [facility]  [address] [id number]       [address]       [place], [place]  [address]       Ph: [phone] or [phone]       Fax: [phone]   RxID:   [id number]

## 2010-07-20 NOTE — Progress Notes (Signed)
Summary: Ok to call in prednisone x 6 days  Phone Note Outgoing Call   Summary of Call: alpha 1 is MM ad normal Initial call taken by: Kalman Shan MD,  April 10, 2010 9:51 PM  Follow-up for Phone Call        LMTCBx1.Carron Curie CMA  April 12, 2010 4:02 PM  spokw with pt to advise of ALpha 1 results. She then states that she took Doxy course given on 03-19-10 for asthma flare and felt better, but x 1 week she has been having increased SOB and wheezing and is requesting prednisone. Pleass advise.Carron Curie CMA  April 19, 2010 11:17 AM  [address] ok Prednisone 10 mg 4 each am x 2days, 2x2days, 1x2days and stop  Follow-up by: Nyoka Cowden MD,  April 19, 2010 12:52 PM  Additional Follow-up for Phone Call Additional follow up Details #1::        ok, you can do the prednsione she wants - she usually will tel the regimen she wants or you can do what Dr Sherene Sires had ordered above Additional Follow-up by: Kalman Shan MD,  April 19, 2010 3:53 PM    Additional Follow-up for Phone Call Additional follow up Details #2::    pt advised and ok with prescribed pred taper. Rx sent.Carron Curie CMA  April 19, 2010 3:57 PM   New/Updated Medications: PREDNISONE 10 MG TABS (PREDNISONE) 4 each am x 2days, 2x2days, 1x2days and stop Prescriptions: PREDNISONE 10 MG TABS (PREDNISONE) 4 each am x 2days, 2x2days, 1x2days and stop  #14 x 0   Entered by:   Carron Curie CMA   Authorized by:   Nyoka Cowden MD   Signed by:   Carron Curie CMA on 04/19/2010   Method used:   [facility]        [facility]  [address] [id number]       [address]       [place], [place]  [address]       Ph: [phone] or [phone]       Fax: [phone]   RxID:   [id number]

## 2010-07-20 NOTE — Miscellaneous (Signed)
Summary: Routine Allergy Skin Test/[facility]  Routine Allergy Skin Test/[facility]   Imported By: Esmeralda Links D'jimraou 11/26/2007 13:20:43  _____________________________________________________________________  External Attachment:    Type:   Image     Comment:   External Document

## 2010-07-20 NOTE — Medication Information (Signed)
Summary: Tax adviser   Imported By: Valinda Hoar 05/30/2008 15:15:41  _____________________________________________________________________  External Attachment:    Type:   Image     Comment:   External Document

## 2010-07-20 NOTE — Letter (Signed)
Summary: Pre Visit No Show Letter  [facility]  [address] [place], [place] [address]   Phone: [phone]  Fax: [phone]        August 28, 2008 MRN: [id number]    Victoria [name] [address] [place], [place]  [address]    Dear Ms. Degner,   We have been unable to reach you by phone concerning the pre-procedure visit that you missed on 08/28/2008. For this reason,your procedure scheduled on 09/11/2008 has been cancelled. Our scheduling staff will gladly assist you with rescheduling your appointments at a more convenient time. Please call our office at [phone] between the hours of 8:00am and 5:00pm, press option #2 to reach an appointment scheduler. Please consider updating your contact numbers at this time so that we can reach you by phone in the future with schedule changes or results.    Thank you,    Doristine Church RN [facility]

## 2010-07-20 NOTE — Progress Notes (Signed)
Summary: Victoria [name] going to ED  Phone Note Call from Patient Call back at Home Phone [phone] Call back at [phone]--cell   Caller: Patient Summary of Call: Patient fell on Feb 9,2011 [facility] Patient states that she has numbness on the right side of cheek. Patient states she has twinges in head. Please advise Initial call taken by: Barb Merino,  July 31, 2009 1:08 PM  Follow-up for Phone Call        pt c/o tingling and numbness in rt eye and cheek. pt denies any dizziness or lighthead. pt advise ED, pt ok...................Marland KitchenFelecia Deloach CMA  July 31, 2009 1:16 PM   Additional Follow-up for Phone Call Additional follow up Details #1::        check on pt later please Additional Follow-up by: [name],  July 31, 2009 1:50 PM    Additional Follow-up for Phone Call Additional follow up Details #2::    pt states that the ED suggested that the symptoms where from the swelling in her face that has now extending down the side of her face. pt was advise by the ED if symptoms worsen she needed to come back to ED..................Marland KitchenFelecia Deloach CMA  July 31, 2009 5:14 PM

## 2010-07-20 NOTE — Progress Notes (Signed)
Summary: CT scan  Phone Note Call from Patient Call back at Home Phone [phone]   Summary of Call: Patient left message on triage stating that radiology stated on report that appendix looked normal, patient would like to know how this could be b/c she has had an appendectomy. Patient also would like to know if a CT would show muscles and if they have come apart. Please advise. Initial call taken by: Lucious Groves CMA,  January 21, 2010 3:33 PM  Follow-up for Phone Call        I'll have to discuss with radiologist----  Please call radiology and have them ask the radiologist in am Follow-up by: [name],  January 21, 2010 5:18 PM  Additional Follow-up for Phone Call Additional follow up Details #1::        DISCUSS WITH PATIENT.............Marland KitchenFelecia Deloach CMA  January 21, 2010 5:20 PM     Additional Follow-up for Phone Call Additional follow up Details #2::    I called [facility] and Dr.Lowne spoke with them and gave the information to the girl that answered the phone. The questions will be presented to the radiologist and someone will call Dr.Lowne back./Chrae [facility] CMA  January 22, 2010 2:25 PM   spoke with Dr Alfredo Batty ---  she has a stub where the Ap would be that looked like an appendix but if she had it removed it is just that a stub. He did see a small ventral hernia---tiny---so that could be causing pain She also has a umbilical hernia and b/l inguinal hernia---but those are not in the area of her pain.  We will wait for addendum ---and fax it all to surgeon. Follow-up by: [name],  January 22, 2010 2:41 PM  Additional Follow-up for Phone Call Additional follow up Details #3:: Details for Additional Follow-up Action Taken: left message to call office...................Marland KitchenFelecia Deloach CMA  January 22, 2010 3:57 PM  left message to call office..................Marland KitchenFelecia Deloach CMA  January 25, 2010 12:14 PM  left message to call  office.............Marland KitchenFelecia Deloach CMA   January 26, 2010 8:23 AM  DISCUSS WITH PATIENT................Marland KitchenFelecia Deloach CMA  January 26, 2010 9:17 AM

## 2010-07-20 NOTE — Letter (Signed)
Summary: Pre Visit Letter Revised  [facility]  [address] [place], [place] [address]   Phone: [phone]  Fax: [phone]        05/25/2010 MRN: [id number]  Victoria [name] [address] [place], [place]  [address]             Procedure Date:  07-08-10 11:30am           Dr  Juanda Chance - Direct Colon      Welcome to the Gastroenterology Division at [facility].    You are scheduled to see a nurse for your pre-procedure visit on 06-25-09 at 11am on the 3rd floor at [facility], [address]  We ask that you try to arrive at our office 15 minutes prior to your appointment time to allow for check-in.  Please take a minute to review the attached form.  If you answer "Yes" to one or more of the questions on the first page, we ask that you call the person listed at your earliest opportunity.  If you answer "No" to all of the questions, please complete the rest of the form and bring it to your appointment.    Your nurse visit will consist of discussing your medical and surgical history, your immediate family medical history, and your medications.   If you are unable to list all of your medications on the form, please bring the medication bottles to your appointment and we will list them.  We will need to be aware of both prescribed and over the counter drugs.  We will need to know exact dosage information as well.    Please be prepared to read and sign documents such as consent forms, a financial agreement, and acknowledgement forms.  If necessary, and with your consent, a friend or relative is welcome to sit-in on the nurse visit with you.  Please bring your insurance card so that we may make a copy of it.  If your insurance requires a referral to see a specialist, please bring your referral form from your primary care physician.  No co-pay is required for this nurse visit.     If you cannot keep your appointment, please call [phone] to cancel or reschedule  prior to your appointment date.  This allows [name] the opportunity to schedule an appointment for another patient in need of care.    Thank you for choosing [facility] for your medical needs.  We appreciate the opportunity to care for you.  Please visit [name] at our website  to learn more about our practice.  Sincerely, The Gastroenterology Division

## 2010-07-20 NOTE — Assessment & Plan Note (Signed)
Summary: Head cold/chest congestion/drb   Vital Signs:  Patient profile:   55 year old female Weight:      173 pounds Temp:     98.1 degrees F oral Pulse rate:   94 / minute Pulse rhythm:   regular BP sitting:   118 / 78  (left arm) Cuff size:   regular  Vitals Entered By: Army Fossa CMA (March 12, 2009 1:58 PM) CC: started with drainage in her throat, moved to her chest, coughing up mucus. Has pressure around eyes.  Has taken Tylenol pm, URI symptoms   History of Present Illness:       This is a 55 year old woman who presents with URI symptoms.  The symptoms began 3 days ago.  The patient complains of productive cough, but denies nasal congestion, clear nasal discharge, purulent nasal discharge, sore throat, dry cough, earache, and sick contacts.  Associated symptoms include wheezing.  The patient denies fever, low-grade fever (<100.5 degrees), fever of 100.5-103 degrees, fever of 103.1-104 degrees, fever to >104 degrees, stiff neck, dyspnea, rash, vomiting, diarrhea, use of an antipyretic, and response to antipyretic.  The patient also reports muscle aches and severe fatigue.  The patient denies itchy watery eyes, itchy throat, sneezing, seasonal symptoms, response to antihistamine, and headache.  The patient denies the following risk factors for Strep sinusitis: unilateral facial pain, unilateral nasal discharge, poor response to decongestant, double sickening, tooth pain, Strep exposure, tender adenopathy, and absence of cough.  Pt is using all her meds but she stopped symbicort yesterday secondary to hives-- hives are now gone.    Current Medications (verified): 1)  Singulair 10 Mg  Tabs (Montelukast Sodium) .... Once Daily 2)  Proventil Hfa 108 (90 Base) Mcg/act Aers (Albuterol Sulfate) .... As Needed 3)  Effexor Xr 150 Mg  Cp24 (Venlafaxine Hcl) .Marland Kitchen.. 1 By Mouth Once Daily 4)  Fexofenadine Hcl 180 Mg Tabs (Fexofenadine Hcl) .... Take 1 By Mouth Once Daily 5)  Tussionex  Pennkinetic Er 8-10 Mg/33ml Lqcr (Chlorpheniramine-Hydrocodone) .Marland Kitchen.. 1 Tsp By Mouth At Bedtime As Needed Cough 6)  Omeprazole 40 Mg Cpdr (Omeprazole) .... Take 1 Tablet By Mouth Once A Day 7)  Prednisone 5 Mg Tabs (Prednisone) .... Take 1 Tablet By Mouth Once A Day 8)  Symbicort 160-4.5 Mcg/act Aero (Budesonide-Formoterol Fumarate) .... Inhale 2 Puffs Twicw A Day 9)  Alvesco 160 Mcg/act Aers (Ciclesonide) .... Take 2 Puff Bid 10)  Prednisone 10 Mg Tabs (Prednisone) .... 3 By Mouth Once Daily For 3 Days Then 2 By Mouth Once Daily For3 Days Then 1 Poqd For 3 Days 11)  Zithromax Z-Pak 250 Mg Tabs (Azithromycin) .... 2 By Mouth Once Daily For 1 Day Then 1 Poqd For 4 More Days 12)  Tussionex Pennkinetic Er 8-10 Mg/74ml Lqcr (Chlorpheniramine-Hydrocodone) .Marland Kitchen.. 1 Tsp By Mouth At Bedtime As Needed  Allergies: 1)  ! Sulfa 2)  ! * Flovent  Past History:  Past medical, surgical, family and social histories (including risk factors) reviewed for relevance to current acute and chronic problems.  Past Medical History: Reviewed history from 01/08/2009 and no changes required. 1) ABPA x 12/14/2007  -> IgE 605 on 11/21/2007 (1 month after steroid burst), Eosinophils 10.4% of 5600 cells, Preciptin positive for fumigatus/nidulans/niger, Asp. skin test positive 11/21/2007, CT scan 2005 &  11/22/2007 unchanged bilateral apical scarring and RUL anterior segment bronchiectasis   2) ASTHMA x age 3 months. .......Marland KitchenRamaswamy >Triggers: harsh winter and viruses Hx of subjective intolerance to ICS - "weight  gain" >  Prior to singulair 02/1992 -> hospitalized 3-4 times/year. Since then, zero hospitalization/er visits.  > Improved asthma control after moving [place] -> [place] in 2004 . "Failed" xolair in 2004 per hx > 8 flares & prednisone bursts May 2008 -> May 2009.  > Commenced daily prednisone 5mg  daily 12/14/2007  s/p  Itraconazole 05/12/2008 >  10/07/2008 (only 1 flare Rx at home in April 2010)   # PFTs 2007 -  FEV1 1.75/76%, FVC  2.88, FEV1/FVC ratio  61%, TLC at 100%, DLCO -  82% PFTs 12/04/2007 at end of pred burst - Fev1 1.7/68%, TLC 5.2/104%, DLCO 17.8/77% -> start daily pred Cleda Daub 02/01/2008  - FEv1 1.62/71%,  Spior 04/21/2008 - Fev1 1.35L/53% -> start itraconazole Spiro 08/01/2008 - Fev1 1.43L/54% ->  cont  itraconazole/daily pred Sprio 10/07/08 - Not done -> dc itraconazole Cleda Daub 11/25/08 - Fev1 1.22L/48% (flare x 2) -> recommence pred & itra -> but she dc'ed itra due to chest pains.  Cleda Daub 7/22/1o- Fev1 1.81L/72% (best ever)  # Obesity - BMI 30 -> "secondary to flovent in 1996"  #) Maxillary Sinusitis 2007.Marland KitchenMarland KitchenDr Jearld Fenton  09/2008 - re-referred to ENT  # Depression  # Osteoarthritis / Osteopenia  - on fosmax   # IBS  # Hyperlipidemia  # Mod OSA by hx in 2007.  AHI 22.5 02/2606 Not on CPAP. Refused sleep re-referral in 11/2007  # Pulmonary nodules - small stable nodules:2006 & summer 2007 ct chest. Not [facility] 11[facility].   #Hx of recurent Hives since highschool > resolved 04/2008 - 10/2008 while on itracconazole for abpa   > recurred 6/10 while off itraconazole but attributed to vit d and flax seed on visit 12/2008  Past Surgical History: Reviewed history from 11/25/2008 and no changes required. Appendectomy Caesarean section Hx of normal stress test 10/2008  Family History: Reviewed history from 12/11/2006 and no changes required. Family History Diabetes 1st degree relative Family History Other cancer-Breast  Social History: Reviewed history from 11/16/2007 and no changes required. Never Smoked Alcohol use-yes Drug use-no Married 1 son with cerebral palsy. Lot of aides at home for son  Review of Systems      See HPI  Physical Exam  General:  Well-developed,well-nourished,in no acute distress; alert,appropriate and cooperative throughout examination Ears:  External ear exam shows no significant lesions or deformities.  Otoscopic examination reveals clear canals, tympanic membranes are  intact bilaterally without bulging, retraction, inflammation or discharge. Hearing is grossly normal bilaterally. Mouth:  Oral mucosa and oropharynx without lesions or exudates.  Teeth in good repair. Neck:  No deformities, masses, or tenderness noted. Lungs:  R wheezes and L wheezes.   Heart:  normal rate and no murmur.   Skin:  Intact without suspicious lesions or rashes Psych:  Cognition and judgment appear intact. Alert and cooperative with normal attention span and concentration. No apparent delusions, illusions, hallucinations   Impression & Recommendations:  Problem # 1:  BRONCHITIS- ACUTE (ICD-466.0)  Her updated medication list for this problem includes:    Singulair 10 Mg Tabs (Montelukast sodium) ..... Once daily    Proventil Hfa 108 (90 Base) Mcg/act Aers (Albuterol sulfate) .Marland Kitchen... As needed    Tussionex Pennkinetic Er 8-10 Mg/64ml Lqcr (Chlorpheniramine-hydrocodone) .Marland Kitchen... 1 tsp by mouth at bedtime as needed cough    Symbicort 160-4.5 Mcg/act Aero (Budesonide-formoterol fumarate) ..... Inhale 2 puffs twicw a day    Alvesco 160 Mcg/act Aers (Ciclesonide) .Marland Kitchen... Take 2 puff bid    Zithromax Z-pak 250  Mg Tabs (Azithromycin) .Marland Kitchen... 2 by mouth once daily for 1 day then 1 poqd for 4 more days    Tussionex Pennkinetic Er 8-10 Mg/52ml Lqcr (Chlorpheniramine-hydrocodone) .Marland Kitchen... 1 tsp by mouth at bedtime as needed  Take antibiotics and other medications as directed. Encouraged to push clear liquids, get enough rest, and take acetaminophen as needed. To be seen in 5-7 days if no improvement, sooner if worse.  Complete Medication List: 1)  Singulair 10 Mg Tabs (Montelukast sodium) .... Once daily 2)  Proventil Hfa 108 (90 Base) Mcg/act Aers (Albuterol sulfate) .... As needed 3)  Effexor Xr 150 Mg Cp24 (Venlafaxine hcl) .Marland Kitchen.. 1 by mouth once daily 4)  Fexofenadine Hcl 180 Mg Tabs (Fexofenadine hcl) .... Take 1 by mouth once daily 5)  Tussionex Pennkinetic Er 8-10 Mg/8ml Lqcr  (Chlorpheniramine-hydrocodone) .Marland Kitchen.. 1 tsp by mouth at bedtime as needed cough 6)  Omeprazole 40 Mg Cpdr (Omeprazole) .... Take 1 tablet by mouth once a day 7)  Prednisone 5 Mg Tabs (Prednisone) .... Take 1 tablet by mouth once a day 8)  Symbicort 160-4.5 Mcg/act Aero (Budesonide-formoterol fumarate) .... Inhale 2 puffs twicw a day 9)  Alvesco 160 Mcg/act Aers (Ciclesonide) .... Take 2 puff bid 10)  Prednisone 10 Mg Tabs (Prednisone) .... 3 by mouth once daily for 3 days then 2 by mouth once daily for3 days then 1 poqd for 3 days 11)  Zithromax Z-pak 250 Mg Tabs (Azithromycin) .... 2 by mouth once daily for 1 day then 1 poqd for 4 more days 12)  Tussionex Pennkinetic Er 8-10 Mg/74ml Lqcr (Chlorpheniramine-hydrocodone) .Marland Kitchen.. 1 tsp by mouth at bedtime as needed Prescriptions: TUSSIONEX PENNKINETIC ER 8-10 MG/5ML LQCR (CHLORPHENIRAMINE-HYDROCODONE) 1 tsp by mouth at bedtime as needed  #6 oz x 0   Entered and Authorized by:   [name]   Signed by:   [name] on 03/12/2009   Method used:   Print then Give to Patient   RxID:   [id number] ZITHROMAX Z-PAK 250 MG TABS (AZITHROMYCIN) 2 by mouth once daily for 1 day then 1 poqd for 4 more days  #1 x 1   Entered and Authorized by:   [name]   Signed by:   [name] on 03/12/2009   Method used:   Print then Give to Patient   RxID:   [id number] PREDNISONE 10 MG TABS (PREDNISONE) 3 by mouth once daily for 3 days then 2 by mouth once daily for3 days then 1 poqd for 3 days  #18 x 0   Entered and Authorized by:   [name]   Signed by:   [name] on 03/12/2009   Method used:   Print then Give to Patient   RxID:   [id number]   Appended Document: Head cold/chest congestion/drb    Clinical Lists Changes  Orders: Added new Service order of Depo- Medrol 80mg  (J1040) - Signed Added new Service order of Admin of Therapeutic Inj  intramuscular or subcutaneous ([id number]) - Signed       Medication  Administration  Injection # 1:    Medication: Depo- Medrol 80mg     Diagnosis: BRONCHITIS- ACUTE (ICD-466.0)    Route: IM    Site: RUOQ gluteus    Exp Date: 07/2010    Lot #: [id number]    Mfr: [email or web address]     Patient tolerated injection without complications    Given by: Army Fossa CMA (March 12, 2009 3:30 PM)  Orders Added: 1)  Depo- Medrol 80mg  [J1040] 2)  Admin of Therapeutic Inj  intramuscular or subcutaneous [[id number]]

## 2010-07-20 NOTE — Assessment & Plan Note (Signed)
Summary: 6 months/apc   Visit Type:  Follow-up Copy to:  [name] Primary Provider/Referring Provider:  [name]  CC:  Pt here for 6 month follow-up. Pt has been on Dailresp x 1 week and c/o nausea and and headache. .  History of Present Illness: Severe persistent asthma (refuses to take ICS+LABA combo due to hives and weight gain but will take oral pred) and ABPA. MM genotype confirmed Sept 2011.    March 19, 2010: Last seen end FEb 2011 and May 2011. Since starting spiriva in Feb 2011 and doubling up singulair in April 2011 she states she has done well. Feels this is the longes she has gone without AE-Asthma. Feels these interventions have allowed her to wean prednisone gradually down to current dose of 5mg  daily. However, past week or so increased cough and yellow-green sputum. No increase in dyspnea and wheeze. 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 Does not feel she needs steroid burst. Feels she can manage with antibiotics. Paradoxically, spirometry today shows improvement to 1.4L/50%. REC: ADD ROFLUMILAST   April 30, 2010: Overall asthma is stable. Son underwent major T spine fusion in interim. Last week had AE-ASthma after son's caregiver came home with cold. Took abd and well now. Was busy did not start roflumilast till 04/25/2010. To;lerating it oky but having some "queaziness" in abdomen since then but she is tolerating itoverall well so far. Finding it exeponsive.  No new problems. No thad flu shot yet    Preventive Screening-Counseling & Management  Alcohol-Tobacco     Alcohol drinks/day: <1     Alcohol type: wine     Smoking Status: never     Passive Smoke Exposure: no  Current Medications (verified): 1)  Singulair 10 Mg  Tabs (Montelukast Sodium) .... Twice  Daily 2)  Proventil Hfa 108 (90 Base) Mcg/act Aers (Albuterol Sulfate) .... As Needed 3)  Effexor Xr 150 Mg  Cp24 (Venlafaxine Hcl) .Marland Kitchen.. 1 By Mouth Once Daily 4)  Fexofenadine Hcl 180 Mg Tabs (Fexofenadine Hcl)  .... Take 1 By Mouth Once Daily 5)  Tussionex Pennkinetic Er 8-10 Mg/59ml Lqcr (Chlorpheniramine-Hydrocodone) .Marland Kitchen.. 1 Tsp By Mouth At Bedtime As Needed Cough 6)  Nebulizer Compressor  Misc (Nebulizers) .... Breathing Treatment As Needed 7)  Prednisone 5 Mg Tabs (Prednisone) .... Take 1 Tablet By Mouth Once A Day 8)  Alprazolam 0.5 Mg Tabs (Alprazolam) .... One Tablet By Mouth At Bedtime Prn 9)  Nasonex 50 Mcg/act Susp (Mometasone Furoate) .... 2 Squirts Each Nostril Daily 10)  Spiriva Handihaler 18 Mcg Caps (Tiotropium Bromide Monohydrate) .... One Puff in The Morning 11)  Asmanex 120 Metered Doses 220 Mcg/inh Aepb (Mometasone Furoate) .... 2 Puffs in The Evening 12)  Prednisone 1 Mg Tabs (Prednisone) .... Take As Directed 13)  Shaklee Osteomatrix -- Ca, Mg, Vita D .... 2 By Mouth Two Times A Day 14)  Mega Red---Omega 3 .Marland Kitchen.. 1 By Mouth Once Daily 15)  Tessalon Perles 100 Mg Caps (Benzonatate) .Marland Kitchen.. 1 By Mouth Three Times A Day As Needed 16)  Lipitor 10 Mg Tabs (Atorvastatin Calcium) .Marland Kitchen.. 1 By Mouth At Bedtime. 17)  Daliresp 500 Mcg Tabs (Roflumilast) .... Take 1 Tablet By Mouth Once A Day  Allergies (verified): 1)  ! Sulfa 2)  ! * Flovent 3)  ! * Symbicort 4)  ! * Dulera 5)  ! * Qvar 6)  ! * Proventil Hfa  Past History:  Past medical, surgical, family and social histories (including risk factors) reviewed, and no  changes noted (except as noted below).  Past Medical History: Reviewed history from 03/19/2010 and no changes required. # ABPA x 12/14/2007 .Marland KitchenMarland KitchenMarland KitchenRamaswamy  # ASTHMA x age 15 months. ....Marland KitchenMarland KitchenRamaswamy  # Obesity - BMI 30 > "secondary to flovent in 1996"  #) Maxillary Sinusitis 2007.Marland KitchenMarland KitchenDr Jearld Fenton    - 09/2008 - re-referred to ENT  # Depression  # Osteoarthritis / Osteopenia  - on fosmax   # IBS  # Hyperlipidemia  # Mod OSA by hx in 2007.  AHI 22.5 02/2606  Refused CPAP and sleep re-referral in 11/2007  # Pulmonary nodules - small stable nodules:2006 & summer 2007 ct chest.  [facility].   #Hx of recurent Hives since highschool   - resolved 04/2008 - 10/2008 while on itracconazole for abpa    -recurred 6/10 while off itraconazole but attributed to vit d and flax seed on visit 12/2008  - Jan 2011 - attributed to qvar  - feb 2011 - hx that in past symbicort, advair, dulera all cause hives  - Sept 2011 - hx of hives with ventolin HFA but not with pro-air  Past Surgical History: Reviewed history from 11/25/2008 and no changes required. Appendectomy Caesarean section Hx of normal stress test 10/2008  Past Pulmonary History:  Pulmonary History: 1) ABPA x 12/14/2007   - IgE 605 on 11/21/2007 (1 mo after steroid burst), Eos 10.4% of 5600 cells, Preciptin positive for fumigatus/nidulans/niger, Asp. skin test positive 11/21/2007, CT scan 2005 &  11/22/2007 unchanged bilateral apical scarring and RUL anterior segment bronchiectasis   2) ASTHMA x age 71 months. ....Marland KitchenMarland KitchenRamaswamy  -Triggers: harsh winter and viruses Hx of subjective intolerance to ICS - "weight gain"  -  Prior to singulair 02/1992 -> hospitalized 3-4 times/year. Since then, zero hospitalization/er visits.   - Improved asthma control after moving [place] -> [place] in 2004 . "Failed" xolair in 2004 per hx  -8 flares & prednisone bursts May 2008 -> May 2009.    - Commenced daily prednisone 5mg  daily 12/14/2007   - s/p  Itraconazole 05/12/2008 >  10/07/2008 (only 1 flare Rx at home in 4/ 2010)  - 07/14/2009: AE asthma. Rx pred burst and QVAR   - 08/11/2009: start spiriva and asmanex  -09/18/2009: AE asthma with self pred taper. Rx pred burst. Double up singulair at request   # PFTs 2007 -  FEV1 1.75/76%, TLC at 100%, DLCO 82% PFTs 12/04/2007 at end of pred burst - Fev1 1.7/68%, TLC 5.2/104%, DLCO 17.8/77% -> start daily pred Cleda Daub 02/01/2008  - FEv1 1.62/71%,  Spior 04/21/2008 - Fev1 1.35L/53% -> start itraconazole Cleda Daub 08/01/2008 - Fev1 1.43L/54% ->  cont  itraconazole/daily pred Post itraconazole  11/25/08 - Fev1  1.22L/48% (flare x 2) -> recommence pred & itra -> but she dc'ed itra due to chest pains.  01/08/09- Fev1 1.81L/72% (best ever) 07/14/09 - Fev1 0.98L/36%, Started pred burst and QVAR -> dc qvar due to chst pain 08/11/09 - Fe1 1.1L/40% -> cont maint pred 10, restart asmanex, start spoiva 09/18/2009: - doubled up singulair at her request 10/28/2009  - Fev1 1.22L/44% 03/19/2010 - Fev1 1.4L/50% -> abx for mild AE. Add roflumilast  Family History: Reviewed history from 12/11/2006 and no changes required. Family History Diabetes 1st degree relative Family History Other cancer-Breast  Social History: Reviewed history from 11/16/2007 and no changes required. Never Smoked Alcohol use-yes Drug use-no Married 1 son with cerebral palsy. Lot of aides at home for son  Review of Systems  The  patient denies anorexia, fever, weight loss, weight gain, vision loss, decreased hearing, hoarseness, chest pain, syncope, dyspnea on exertion, peripheral edema, prolonged cough, headaches, hemoptysis, abdominal pain, melena, hematochezia, severe indigestion/heartburn, hematuria, incontinence, genital sores, muscle weakness, suspicious skin lesions, transient blindness, difficulty walking, depression, unusual weight change, abnormal bleeding, enlarged lymph nodes, angioedema, breast masses, and testicular masses.    Vital Signs:  Patient profile:   56 year old female Height:      64 inches Weight:      172 pounds BMI:     29.63 O2 Sat:      99 % on Room air Temp:     98.0 degrees F oral Pulse rate:   101 / minute BP sitting:   120 / 78  (right arm) Cuff size:   regular  Vitals Entered By: Carron Curie CMA (April 30, 2010 2:40 PM)  O2 Flow:  Room air CC: Pt here for 6 month follow-up. Pt has been on Dailresp x 1 week and c/o nausea, and headache.  Comments Medications reviewed with patient Carron Curie CMA  April 30, 2010 2:41 PM Daytime phone number verified with patient.    Physical  Exam  General:  well developed, well nourished, in no acute distressobese.   Head:  normocephalic and atraumatic Eyes:  PERRLA/EOM intact; conjunctiva and sclera clear Ears:  TMs intact and clear with normal canals Nose:  no deformity, discharge, inflammation, or lesions Mouth:  no deformity or lesions Neck:  no masses, thyromegaly, or abnormal cervical nodes Chest Wall:  no deformities noted Lungs:  clear bilaterally to auscultation and percussion Heart:  regular rate and rhythm, S1, S2 without murmurs, rubs, gallops, or clicks Abdomen:  bowel sounds positive; abdomen soft and non-tender without masses, or organomegaly Obese + Msk:  no deformity or scoliosis noted with normal posture Pulses:  pulses normal Extremities:  no clubbing, cyanosis, edema, or deformity noted Neurologic:  CN II-XII grossly intact with normal reflexes, coordination, muscle strength and tone Skin:  scattered macular urticarial rash in groin, axilla, thigh Cervical Nodes:  no significant adenopathy Axillary Nodes:  no significant adenopathy Psych:  alert and cooperative; normal mood and affect; normal attention span and concentration   Impression & Recommendations:  Problem # 1:  ASTHMA, PERSISTENT, SEVERE (ICD-493.90) Assessment Unchanged  Doing well on visit on 04/30/10. Been on spiriva since feb 2011 and doubling of singulair in april 2011. STarted roflumilast 04/25/2010. Ohter maintenance meds include daily prednisone and asmanex  plan contnue above flu shot today rov 6 months  Medications Added to Medication List This Visit: 1)  Daliresp 500 Mcg Tabs (Roflumilast) .... Take 1 tablet by mouth once a day  Other Orders: Est. Patient Level II (16109)  Patient Instructions: 1)  continue your meds 2)  flu shot today 3)  return in 6 months or sooner if needed

## 2010-07-20 NOTE — Assessment & Plan Note (Signed)
Summary: rto review lab/cbs   Vital Signs:  Patient profile:   55 year old female Weight:      171.4 pounds Pulse rate:   88 / minute Pulse rhythm:   regular BP sitting:   118 / 80  (left arm) Cuff size:   large  Vitals Entered By: Almeta Monas CMA Duncan Dull) (May 27, 2010 11:08 AM) CC: wants to discuss labs--stopped Lipitor due to leg pain   History of Present Illness: Pt here to review labs.  she stopped lipitor because of myalgias about 2 weeks before labs done. .  Preventive Screening-Counseling & Management  Alcohol-Tobacco     Alcohol drinks/day: <1     Alcohol type: wine     Smoking Status: never     Passive Smoke Exposure: no  Caffeine-Diet-Exercise     Caffeine use/day: 3     Does Patient Exercise: no     Type of exercise: water ,  elliptical, weights, bands     Exercise (avg: min/session): 7:01     Times/week: 2     Exercise Counseling: to improve exercise regimen  Current Medications (verified): 1)  Singulair 10 Mg  Tabs (Montelukast Sodium) .... Twice  Daily 2)  Proventil Hfa 108 (90 Base) Mcg/act Aers (Albuterol Sulfate) .... As Needed 3)  Effexor Xr 37.5 Mg Xr24h-Cap (Venlafaxine Hcl) .... 3 A Day For 1 Month Then 2 A Day For 1 Month Then 1 A Day For 1 Month 4)  Fexofenadine Hcl 180 Mg Tabs (Fexofenadine Hcl) .... Take 1 By Mouth Once Daily 5)  Tussionex Pennkinetic Er 8-10 Mg/91ml Lqcr (Chlorpheniramine-Hydrocodone) .Marland Kitchen.. 1 Tsp By Mouth At Bedtime As Needed Cough 6)  Nebulizer Compressor  Misc (Nebulizers) .... Breathing Treatment As Needed 7)  Prednisone 5 Mg Tabs (Prednisone) .... Take 1 Tablet By Mouth Once A Day 8)  Alprazolam 0.5 Mg Tabs (Alprazolam) .... One Tablet By Mouth At Bedtime Prn 9)  Nasonex 50 Mcg/act Susp (Mometasone Furoate) .... 2 Squirts Each Nostril Daily 10)  Spiriva Handihaler 18 Mcg Caps (Tiotropium Bromide Monohydrate) .... One Puff in The Morning 11)  Asmanex 120 Metered Doses 220 Mcg/inh Aepb (Mometasone Furoate) .... 2 Puffs  in The Evening 12)  Prednisone 1 Mg Tabs (Prednisone) .... Take As Directed 13)  Shaklee Osteomatrix -- Ca, Mg, Vita D .... 2 By Mouth Two Times A Day 14)  Mega Red---Omega 3 .Marland Kitchen.. 1 By Mouth Once Daily 15)  Tessalon Perles 100 Mg Caps (Benzonatate) .Marland Kitchen.. 1 By Mouth Three Times A Day As Needed 16)  Zocor 40 Mg Tabs (Simvastatin) .Marland Kitchen.. 1 By Mouth At Bedtime  Allergies (verified): 1)  ! Sulfa 2)  ! * Flovent 3)  ! * Symbicort 4)  ! * Dulera 5)  ! * Qvar 6)  ! * Proventil Hfa  Past History:  Past Medical History: Last updated: 03/19/2010 # ABPA x 12/14/2007 .Marland KitchenMarland KitchenMarland KitchenRamaswamy  # ASTHMA x age 63 months. ....Marland KitchenMarland KitchenRamaswamy  # Obesity - BMI 30 > "secondary to flovent in 1996"  #) Maxillary Sinusitis 2007.Marland KitchenMarland KitchenDr Jearld Fenton    - 09/2008 - re-referred to ENT  # Depression  # Osteoarthritis / Osteopenia  - on fosmax   # IBS  # Hyperlipidemia  # Mod OSA by hx in 2007.  AHI 22.5 02/2606  Refused CPAP and sleep re-referral in 11/2007  # Pulmonary nodules - small stable nodules:2006 & summer 2007 ct chest. Not present at [facility] 11/[facility].   #Hx of recurent Hives since highschool   -  resolved 04/2008 - 10/2008 while on itracconazole for abpa    -recurred 6/10 while off itraconazole but attributed to vit d and flax seed on visit 12/2008  - Jan 2011 - attributed to qvar  - feb 2011 - hx that in past symbicort, advair, dulera all cause hives  - Sept 2011 - hx of hives with ventolin HFA but not with pro-air  Past Surgical History: Last updated: 11/25/2008 Appendectomy Caesarean section Hx of normal stress test 10/2008  Family History: Last updated: 12/11/2006 Family History Diabetes 1st degree relative Family History Other cancer-Breast  Social History: Last updated: 11/16/2007 Never Smoked Alcohol use-yes Drug use-no Married 1 son with cerebral palsy. Lot of aides at home for son  Risk Factors: Alcohol Use: <1 (05/27/2010) Caffeine Use: 3 (05/27/2010) Exercise: no (05/27/2010)  Risk  Factors: Smoking Status: never (05/27/2010) Passive Smoke Exposure: no (05/27/2010)  Family History: Reviewed history from 12/11/2006 and no changes required. Family History Diabetes 1st degree relative Family History Other cancer-Breast  Social History: Reviewed history from 11/16/2007 and no changes required. Never Smoked Alcohol use-yes Drug use-no Married 1 son with cerebral palsy. Lot of aides at home for son  Review of Systems      See HPI  Physical Exam  General:  Well-developed,well-nourished,in no acute distress; alert,appropriate and cooperative throughout examination Lungs:  Normal respiratory effort, chest expands symmetrically. Lungs are clear to auscultation, no crackles or wheezes. Heart:  Normal rate and regular rhythm. S1 and S2 normal without gallop, murmur, click, rub or other extra sounds. Psych:  Cognition and judgment appear intact. Alert and cooperative with normal attention span and concentration. No apparent delusions, illusions, hallucinations   Impression & Recommendations:  Problem # 1:  TRANSAMINASES, SERUM, ELEVATED (ICD-790.4) Assessment Improved  Orders: Venipuncture (84696) T- * Misc. Laboratory test [phone]) TLB-Hepatic/Liver Function Pnl (80076-HEPATIC) TLB-GGT (Gamma GT) (82977-GGT)  Problem # 2:  HYPERLIPIDEMIA (ICD-272.4) Assessment: Comment Only  The following medications were removed from the medication list:    Lipitor 10 Mg Tabs (Atorvastatin calcium) .Marland Kitchen... 1 by mouth at bedtime. Her updated medication list for this problem includes:    Zocor 40 Mg Tabs (Simvastatin) .Marland Kitchen... 1 by mouth at bedtime  Labs Reviewed: SGOT: 36 (05/20/2010)   SGPT: 75 (05/20/2010)   HDL:49.30 (05/20/2010), 55.10 (12/03/2009)  LDL:DEL (08/19/2008), DEL (09/21/2007)  Chol:259 (05/20/2010), 264 (12/03/2009)  Trig:130.0 (05/20/2010), 173.0 (12/03/2009)  Complete Medication List: 1)  Singulair 10 Mg Tabs (Montelukast sodium) .... Twice  daily 2)   Proventil Hfa 108 (90 Base) Mcg/act Aers (Albuterol sulfate) .... As needed 3)  Effexor Xr 37.5 Mg Xr24h-cap (Venlafaxine hcl) .... 3 a day for 1 month then 2 a day for 1 month then 1 a day for 1 month 4)  Fexofenadine Hcl 180 Mg Tabs (Fexofenadine hcl) .... Take 1 by mouth once daily 5)  Tussionex Pennkinetic Er 8-10 Mg/54ml Lqcr (Chlorpheniramine-hydrocodone) .Marland Kitchen.. 1 tsp by mouth at bedtime as needed cough 6)  Nebulizer Compressor Misc (Nebulizers) .... Breathing treatment as needed 7)  Prednisone 5 Mg Tabs (Prednisone) .... Take 1 tablet by mouth once a day 8)  Alprazolam 0.5 Mg Tabs (Alprazolam) .... One tablet by mouth at bedtime prn 9)  Nasonex 50 Mcg/act Susp (Mometasone furoate) .... 2 squirts each nostril daily 10)  Spiriva Handihaler 18 Mcg Caps (Tiotropium bromide monohydrate) .... One puff in the morning 11)  Asmanex 120 Metered Doses 220 Mcg/inh Aepb (Mometasone furoate) .... 2 puffs in the evening 12)  Prednisone 1 Mg Tabs (Prednisone) .Marland KitchenMarland KitchenMarland Kitchen  Take as directed 13)  Shaklee Osteomatrix -- Ca, Mg, Vita D  .... 2 by mouth two times a day 14)  Mega Red---omega 3  .Marland Kitchen.. 1 by mouth once daily 15)  Tessalon Perles 100 Mg Caps (Benzonatate) .Marland Kitchen.. 1 by mouth three times a day as needed 16)  Zocor 40 Mg Tabs (Simvastatin) .Marland Kitchen.. 1 by mouth at bedtime  Patient Instructions: 1)  fasting labs 3 months---- 272.4  [facility],  bmp 2)  ov 3-4 weeks after labs Prescriptions: ZOCOR 40 MG TABS (SIMVASTATIN) 1 by mouth at bedtime  #90 x 3   Entered and Authorized by:   [name]   Signed by:   [name] on 05/27/2010   Method used:   Faxed to ...       [facility] (mail-order)       [address]       [place], [place]  [address]       Ph: [phone]       Fax: [phone]   RxID:   [id number] EFFEXOR XR 37.5 MG XR24H-CAP (VENLAFAXINE HCL) 3 a day for 1 month then 2 a day for 1 month then 1 a day for 1 month  #180 x 0   Entered and Authorized by:   [name]   Signed by:    [name] on 05/27/2010   Method used:   Faxed to ...       [facility] (mail-order)       [address]       [place], [place]  [address]       Ph: [phone]       Fax: [phone]   RxID:   [id number] ZOCOR 40 MG TABS (SIMVASTATIN) 1 by mouth at bedtime  #30 x 2   Entered and Authorized by:   [name]   Signed by:   [name] on 05/27/2010   Method used:   [facility]ronically to        [facility]  [address] [id number] ([facility]ail[facility]       [address]       [place], [place]  [address]       Ph: [phone] or [phone]       Fax: [phone]   RxID:   [id number]    Orders Added: 1)  Venipuncture [50093] 2)  T- * Misc. Laboratory test [99999] 3)  TLB-Hepatic/Liver Function Pnl [80076-HEPATIC] 4)  TLB-GGT (Gamma GT) [82977-GGT] 5)  Est. Patient Level III [81829]

## 2010-07-20 NOTE — Letter (Signed)
Summary: Letter Regarding Need for Osteoporosis Screening/[facility]  Letter Regarding Need for Osteoporosis Screening/[facility]   Imported By: Lanelle Bal 08/18/2009 09:50:56  _____________________________________________________________________  External Attachment:    Type:   Image     Comment:   External Document  Appended Document: Letter Regarding Need for Osteoporosis Screening/[facility] make sure pt has not had bmd somewhere else----if no---order bmd----  secondary to steroid use  Appended Document: Letter Regarding Need for Osteoporosis Screening/[facility] Pt states that it was done at [facility] for [facility]

## 2010-07-20 NOTE — Assessment & Plan Note (Signed)
Summary: ACUTE ONLY-BOIL ON ANKLE AND   Vital Signs:  Patient Profile:   55 Years Old Female Height:     63.5 inches Weight:      170 pounds Temp:     98.4 degrees F oral Pulse rate:   72 / minute Resp:     16 per minute BP sitting:   118 / 70  (right arm)  Pt. in pain?   no  Vitals Entered By: Ardyth Man (May 10, 2007 11:01 AM)                  PCP:  Laury Axon  Chief Complaint:  Boil on right inner left ankle and one on right outer axillary area x 2 days ago.  History of Present Illness: As stated above. no drainage. Denies fever, chill, nausea, other lesions.  Denies bug bites, hot tub exposure.  Current Allergies: ! SULFA      Physical Exam  Skin:     Just above the medial mallelous: 3cm area if erythema with induration. no fluctuance. Mild tenderness. Warm to touch. Similar lesion right upper back at the axillary crease.    Impression & Recommendations:  Problem # 1:  CELLULITIS (ICD-682.9)  Her updated medication list for this problem includes:    Levaquin Leva-pak 750 Mg Tabs (Levofloxacin) .Marland Kitchen... As directed. Patient was on Avelox for Sinusitis per dr. Mollie Germany. I called and spoke to Dr. Mollie Germany and he was okay to switch to Levoguin and advise me to tell patient to restart Avelox if still congested after completion of Levoguin.  Elevate affected area. Warm moist compresses for 20 minutes every 2 hours while awake. Take antibiotics as directed and take acetaminophen as needed. To be seen in 48-72 hours if no improvement, sooner if worse. Schedule F/u with Dr. Laury Axon on monday/tuesday  Her updated medication list for this problem includes:    Levaquin Leva-pak 750 Mg Tabs (Levofloxacin) .Marland Kitchen... As directed   Complete Medication List: 1)  Singulair 10 Mg Tabs (Montelukast sodium) .... Once daily 2)  Serevent Diskus 50 Mcg/dose Aepb (Salmeterol xinafoate) .... 2 puffs every morning 3)  Albuterol Aers (Albuterol aers) 4)  Duoneb 2.5-0.5 Mg/18ml Soln  (Albuterol-ipratropium) 5)  Effexor Xr 150 Mg Cp24 (Venlafaxine hcl) .Marland Kitchen.. 1 by mouth once daily 6)  Asmanex 120 Metered Doses 220 Mcg/inh Aepb (Mometasone furoate) .... One puff twice daily 7)  Fosamax 35 Mg Tabs (Alendronate sodium) .... One tablet weekly 8)  Nasonex 50 Mcg/act Susp (Mometasone furoate) .... One spray in each nostril twice daily 9)  Prednisone 10 Mg Tabs (Prednisone) .... Daily for 2 days and then 10 mg daily 10)  Levaquin Leva-pak 750 Mg Tabs (Levofloxacin) .... As directed 11)  Benzonatate 100 Mg Caps (Benzonatate) .... Take q6hrs for cough     Prescriptions: LEVAQUIN LEVA-PAK 750 MG  TABS (LEVOFLOXACIN) as directed  #1 x 0   Entered and Authorized by:   Leanne Chang MD   Signed by:   Leanne Chang MD on 05/10/2007   Method used:   Print then Give to Patient   RxID:   [id number]  ]

## 2010-07-20 NOTE — Medication Information (Signed)
Summary: Clarification for Singulair/[facility]  Clarification for Singulair/[employer]   Imported By: Sherian Rein 11/23/2009 13:54:04  _____________________________________________________________________  External Attachment:    Type:   Image     Comment:   External Document

## 2010-07-20 NOTE — Letter (Signed)
Summary: Letter to Patient with Pap Results/[facility]  Letter to Patient with Pap Results/[facility]   Imported By: Lanelle Bal 08/26/2009 11:29:48  _____________________________________________________________________  External Attachment:    Type:   Image     Comment:   External Document

## 2010-07-20 NOTE — Progress Notes (Signed)
Summary: pls call patient-LMTCB  Phone Note Outgoing Call   Summary of Call: pls call patient, I reviewd letter dated 08/25/2009. She can take singulair at 2 tab at bedtime per her research. I have to read some more on it. But have her continue inhalers spiriva and asmanex. Ok to taper down pred but not stop it becuase of withdrawal. I need to see her 1 month into double dose singulair with spirometry so we can plan on reductions if she is doing well Initial call taken by: Kalman Shan MD,  September 18, 2009 5:45 PM  Follow-up for Phone Call        [facility]. Carron Curie CMA  September 24, 2009 4:19 PM Pt states she is currently on 6 mg pr prednisone since 09/14/09. She is now having productive cough with yellow phlegm and increased wheezing x 1 week. Pt wants to know what MR recs are. Does she need to increase prednisone? SHe is also requesting rx for tussionex 8oz because this cost her the same copay as a smaller bottle. She states this wil llast her 6 months. Pt has appt on 10-13-09 with MR.  Please advise. Carron Curie CMA  September 25, 2009 11:43 AM   Additional Follow-up for Phone Call Additional follow up Details #1::         2 week pred taper starting at 60mg  daily x 3 days, 40mg  daily x 3 days, then 30mg  daily x 3 days, then 20mg  daily x 3 days, then 10mg  daily dose to continue  ABX: she can do doxy 100mg  two times a day x 5 days, or omnicef 300mg  by mouth two times a day x 5 days or avelox 400mg  daily x 5 days  NO tussionex currently Additional Follow-up by: Kalman Shan MD,  September 25, 2009 12:12 PM    Additional Follow-up for Phone Call Additional follow up Details #2::    LMTCB. Carron Curie CMA  September 29, 2009 12:26 PM pt advised of recs she requests Zpak, per MR ok to send zpak with pred taper. rx sent. pt aware. Carron Curie CMA  September 30, 2009 11:48 AM   New/Updated Medications: PREDNISONE 10 MG TABS (PREDNISONE) 60mg  daily x 3 days, 40mg  daily x 3 days, then  30mg  daily x 3 days, then 20mg  daily x 3 days, then 10mg  daily dose to continue ZITHROMAX Z-PAK 250 MG TABS (AZITHROMYCIN) as directed Prescriptions: ZITHROMAX Z-PAK 250 MG TABS (AZITHROMYCIN) as directed  #1 pak x 0   Entered by:   Carron Curie CMA   Authorized by:   Kalman Shan MD   Signed by:   Carron Curie CMA on 09/30/2009   Method used:   [facility]  [address] [id number]       [address]       [place], [place]  [address]       Ph: [phone] or [phone]       Fax: [phone]   RxID:   [id number] PREDNISONE 10 MG TABS (PREDNISONE) 60mg  daily x 3 days, 40mg  daily x 3 days, then 30mg  daily x 3 days, then 20mg  daily x 3 days, then 10mg  daily dose to continue  #1qs x 0   Entered by:   Carron Curie CMA   Authorized by:   Kalman Shan MD   Signed by:   Carron Curie CMA on 09/30/2009   Method  used:   [facility]  [address] [id number]       [address]       [place], [place]  [address]       Ph: [phone] or [phone]       Fax: [phone]   RxID:   [id number]

## 2010-07-20 NOTE — Assessment & Plan Note (Signed)
Summary: breathing  problem/ mbw   Visit Type:  Initial Consult Referred by:  [name] PCP:  [name]  Chief Complaint:  pulmonary consult........Marland Kitchenreviewed meds..........asthma.  History of Present Illness: 55 year old female. here to establish asthma care. Currently in stable health   Says she has had asthma (severe) since age 51. Moved to [place] from [place], [place] in 2004 due to asthma due to the winter chill in [place], [place]. Prior to move, winter asthma would be signficant enought that she would be on prednisone at least every few weeks.  Says since move to [place], [place] asthma is much better. Sasy asthma is not made worse by the pollen, spring or summer heat/humdidity. Currently asthma triggers are sinus infections and viral infection. [place] winter does not bother asthma. Nevertheless, has had 8 course of prednisone between May 2008 - May 2009 but no er visits, no hospitalizations. Exacerbations trypiclaly treated with antiiotics and steroids  Between exacerbationss, says she is in stable health and reports "well controlled" asthma and needing albuterol < 2 times per week. Currently maintained servent 1 puff two times a day, allegra, singulair, asmanex (takes steroid and LABA separately). Has had prior intolerance to flovent and is generally reluctant to take inhalerd steroids. In gneral, she feels she gets systemic side effects with inahled steroids. Wants to know if she can discontinue inhaled steoids. Prefers a regimen of just taking prednisone bursts during exacerbations and no inhaled steroids in interim.        Updated Prior Medication List: SINGULAIR 10 MG  TABS (MONTELUKAST SODIUM) once daily ALBUTEROL   AERS (ALBUTEROL AERS)  DUONEB 2.5-0.5 MG/3ML  SOLN (ALBUTEROL-IPRATROPIUM)  EFFEXOR XR 150 MG  CP24 (VENLAFAXINE HCL) 1 by mouth once daily FOSAMAX 35 MG  TABS (ALENDRONATE SODIUM) One tablet weekly NASONEX 50 MCG/ACT  SUSP (MOMETASONE FUROATE) One spray in each nostril twice  daily BENZONATATE 100 MG  CAPS (BENZONATATE) Take q6hrs for cough ZOCOR 40 MG  TABS (SIMVASTATIN) Take one tablet daily SEREVENT DISKUS 50 MCG/DOSE  AEPB (SALMETEROL XINAFOATE) once daily as needed ASMANEX 120 METERED DOSES 220 MCG/INH  AEPB (MOMETASONE FUROATE) once daily as needed  Current Allergies (reviewed today): ! SULFA ! * FLOVENT  Past Medical History:    Asthma x since age 57 months. Severr winter <-75F and viral infections are typical triggers. 8 prednisone bursts between may 2008-may 2009. Prior to 02/1992 - hospitaliaxed 3-4 times per year and 3-4 ytimes per year. Since, 02/2002 took singulair which has reduced hospitalization and er visits to zero. Started flovent in 1997 but had systemic steroid intolerance. Tolerates asmanex inh steroid only        Hx of "ectremely high IgE levels" - fialed xolair therapy in 2004 per echart.         Denies diagnosis of ABPA and workup for same        PFTs 2007 - Pulmonary function showed an FEV1 of 1.75, FEC 2.88, FEV1/FVC ratio of 61%     of predicted. Total lung capacity at 100% predicated, diffusion capacity to     82% predicted. Values are compatible with moderate persistent obstructive     defect.        Obesity - BMI 30        Maxillary Sinusitsis 2007 - Rx by ENT    Depression    Osteoarthritis    Osteopenia    IBS    Hyperlipidemia    Mod OSA - in 2007. Not on CPAP ? why  Family History:    Reviewed history from 12/11/2006 and no changes required:       Family History Diabetes 1st degree relative       Family History Other cancer-Breast  Social History:    Reviewed history from 12/15/2006 and no changes required:       Never Smoked       Alcohol use-yes       Drug use-no       Married 1 son with cerebral palsy. Lot of aides at home for son   Risk Factors: Tobacco use:  never Passive smoke exposure:  no Drug use:  no Alcohol use:  yes Exercise:  yes    Times per week:  2    Type:  water  Seatbelt use:  100  %  Family History Risk Factors:    Family History of MI in females < 82 years old:  no    Family History of MI in males < 21 years old:  no   Review of Systems       The patient complains of dyspnea on exhertion.  The patient denies anorexia, fever, weight loss, weight gain, vision loss, decreased hearing, hoarseness, chest pain, syncope, peripheral edema, prolonged cough, hemoptysis, abdominal pain, melena, hematochezia, severe indigestion/heartburn, hematuria, incontinence, genital sores, muscle weakness, suspicious skin lesions, transient blindness, difficulty walking, depression, unusual weight change, abnormal bleeding, enlarged lymph nodes, angioedema, breast masses, and testicular masses.     Vital Signs:  Patient Profile:   55 Years Old Female Height:     63.5 inches Weight:      172.2 pounds BMI:     30.13 O2 Sat:      100 % O2 treatment:    Room Air Temp:     97.3 degrees F oral Pulse rate:   79 / minute BP sitting:   130 / 80  (left arm) Cuff size:   regular  Pt. in pain?   no  Vitals Entered By: Clarise Cruz Duncan Dull) (Nov 16, 2007 11:21 AM)                  Physical Exam  General:     obese.   Head:     normocephalic and atraumatic Eyes:     PERRLA/EOM intact; conjunctiva and sclera clear Ears:     TMs intact and clear with normal canals Nose:     no deformity, discharge, inflammation, or lesions Mouth:     no deformity or lesions Neck:     no masses, thyromegaly, or abnormal cervical nodes Chest Wall:     no deformities noted Lungs:     clear bilaterally to auscultation and percussion Heart:     regular rate and rhythm, S1, S2 without murmurs, rubs, gallops, or clicks Abdomen:     bowel sounds positive; abdomen soft and non-tender without masses, or organomegaly Msk:     no deformity or scoliosis noted with normal posture Pulses:     pulses normal Extremities:     no clubbing, cyanosis, edema, or deformity noted Neurologic:     CN  II-XII grossly intact with normal reflexes, coordination, muscle strength and tone Skin:     intact without lesions or rashes Cervical Nodes:     no significant adenopathy Psych:     alert and cooperative; normal mood and affect; normal attention span and concentration   CT of Chest  Procedure date:  09/14/2005  Findings:      Personally reviewed - compared  to 04/15/2005. Agree and one more comnet  IMPRESSION:   1.  Stable appearance of the biapical pleuroparenchymal scarring   changes.   2.  Multiple bilateral small noncalcified pulmonary nodules which are   stable and most likely represent noncalcified granulomata.  However I   would recommend additional followup CT scan in 6 months.   3.  Small hiatal hernia - stable.   4.  Left posteromedial diaphragmatic hernia which contains fat -   stable.   5.  Stable small right lobe hepatic cyst.  Comments:      There is also a possible RML bronchiectasis on CT chest not repoted by radilogitst    Problem # 1:  ASTHMA, PERSISTENT, SEVERE (ICD-493.90) Assessment: New Lifelong asthma. HIGH RISK factors present. Hx of high IgE - failed xolair 2004. Denies ABPA present. Moderate Persistent by Hx. Hx of frequent exacerbations with at least 8 prdnssione coursses may 2008 through may 2009. Reluctant to take inhaled sterodis but grudgingly taking mometasone. Currently in stable disease status and "well controlled" category. Aspergillu skin test 11/16/2007 is borderline positive but she was on allegra  PLAN Full PFTs Aspergillus skin testing (R>L bilateral bipaical scars seen in 2006 and 2007 CT chest along with ? RML bronchiectasis) - retest 3 days off alegra If skin test postiive, will check Ig E and Aspergilus preciptis Return to office  Advised to continue mometasone by mouth inhaler Referrral to [facility] once more data availabe The following medications were removed from the medication list:    Symbicort 160-4.5 Mcg/act Aero  (Budesonide-formoterol fumarate) .Marland Kitchen... Take 2 puffs in am and 2 puffs in pm.  Her updated medication list for this problem includes:    Singulair 10 Mg Tabs (Montelukast sodium) ..... Once daily    Albuterol Aers (Albuterol aers)    Duoneb 2.5-0.5 Mg/35ml Soln (Albuterol-ipratropium)    Serevent Diskus 50 Mcg/dose Aepb (Salmeterol xinafoate) ..... Once daily as needed    Asmanex 120 Metered Doses 220 Mcg/inh Aepb (Mometasone furoate) ..... Once daily as needed  Orders: Consultation Level V (16109)   Problem # 2:  BRONCHIECTASIS (ICD-494.0) Assessment: New ? RML bronchiectasis in 2006 and 2007 CT chest  PLAN CT chest The following medications were removed from the medication list:    Symbicort 160-4.5 Mcg/act Aero (Budesonide-formoterol fumarate) .Marland Kitchen... Take 2 puffs in am and 2 puffs in pm.  Her updated medication list for this problem includes:    Singulair 10 Mg Tabs (Montelukast sodium) ..... Once daily    Albuterol Aers (Albuterol aers)    Duoneb 2.5-0.5 Mg/62ml Soln (Albuterol-ipratropium)    Serevent Diskus 50 Mcg/dose Aepb (Salmeterol xinafoate) ..... Once daily as needed    Asmanex 120 Metered Doses 220 Mcg/inh Aepb (Mometasone furoate) ..... Once daily as needed  Orders: Consultation Level V [phone])   Problem # 3:  PULMONARY NODULE (ICD-518.89) Assessment: New small stable nodules in 2006 fal and summer 2007 ct chest. No followup after that  PLAN ct chest Orders: Consultation Level V ([id number])   Problem # 4:  OBSTRUCTIVE SLEEP APNEA (ICD-780.57) Assessment: New Mod OSA diagnosed in 04/2006 but not on CPAP - ? why  PLAN refer Dr. Vassie Loll or Drs. Sood Orders: Consultation Level V [phone])   Medications Added to Medication List This Visit: 1)  Serevent Diskus 50 Mcg/dose Aepb (Salmeterol xinafoate) .... Once daily as needed 2)  Asmanex 120 Metered Doses 220 Mcg/inh Aepb (Mometasone furoate) .... Once daily as needed   Patient Instructions: 1)  Please  have FUlL  PFTs done 2)  Please visit with Drs. Vassie Loll or Sood for sleep apnea evaluation 3)  Continue your current medications 4)  Check Immediate skin test reactivity to A. fumigatus  5)  Return after above   ]

## 2010-07-20 NOTE — Assessment & Plan Note (Signed)
Summary: recheck mersa/cbs - per michelle   Vital Signs:  Patient Profile:   55 Years Old Female Height:     63.5 inches Weight:      167.38 pounds Temp:     98.6 degrees F oral Pulse rate:   72 / minute Resp:     16 per minute BP sitting:   122 / 74  (right arm)  Pt. in pain?   no  Vitals Entered By: Ardyth Man (June 06, 2007 1:56 PM)                  Chief Complaint:  recheck mersa.  Current Allergies: ! SULFA        Complete Medication List: 1)  Singulair 10 Mg Tabs (Montelukast sodium) .... Once daily 2)  Serevent Diskus 50 Mcg/dose Aepb (Salmeterol xinafoate) .... 2 puffs every morning 3)  Albuterol Aers (Albuterol aers) 4)  Duoneb 2.5-0.5 Mg/68ml Soln (Albuterol-ipratropium) 5)  Effexor Xr 150 Mg Cp24 (Venlafaxine hcl) .Marland Kitchen.. 1 by mouth once daily 6)  Asmanex 120 Metered Doses 220 Mcg/inh Aepb (Mometasone furoate) .... One puff twice daily 7)  Fosamax 35 Mg Tabs (Alendronate sodium) .... One tablet weekly 8)  Nasonex 50 Mcg/act Susp (Mometasone furoate) .... One spray in each nostril twice daily 9)  Prednisone 10 Mg Tabs (Prednisone) .... Daily for 2 days and then 10 mg daily 10)  Benzonatate 100 Mg Caps (Benzonatate) .... Take q6hrs for cough     ]  Influenza Vaccine    Vaccine Type: Fluvax Non-MCR    Site: right deltoid    Mfr: Sanofi Pasteur    Dose: 0.5 ml    Route: IM    Given by: Ardyth Man    Exp. Date: 12/18/2007    Lot #: [id number]  Flu Vaccine Consent Questions    Do you have a history of severe allergic reactions to this vaccine? no    Any prior history of allergic reactions to egg and/or gelatin? no    Do you have a sensitivity to the preservative Thimersol? no    Do you have a past history of Guillan-Barre Syndrome? no    Do you currently have an acute febrile illness? no    Have you ever had a severe reaction to latex? no    Vaccine information given and explained to patient? yes    Are you currently pregnant? no

## 2010-07-20 NOTE — Assessment & Plan Note (Signed)
Summary: REMOVAL OF STITCHES AND CAT SCAN TOO//PH   Vital Signs:  Patient profile:   55 year old female Weight:      177 pounds Temp:     98.6 degrees F oral Pulse rate:   76 / minute Pulse rhythm:   regular BP sitting:   122 / 84  (left arm) Cuff size:   regular  Vitals Entered By: Army Fossa CMA (August 03, 2009 10:35 AM) CC: Pt would like to discuss CT scan that was done because of fall, and look at stitches.    History of Present Illness: Pt here f/u fall in beauty salon-- she tripped over cape and had to go to ER for stiches.  CT done which was negative. Pt needs stiches removed.  Pt had episode in January when she was given a devotion in a gym---her head started to hurt suddenly.  She felt like her head was in a vice.  EMS was called and her bp was high.  She refused treatment and bp slowly came down on its own.   Pt has had no more episodes.  Pt states severe headache lasted about 20 min.  Few days before pt c/o a "zinging " sensation on R side of head and it was worse the day of the severe head pain and the sensation has been worse since but no more Headaches.  No blurry vision or dizziness.    Current Medications (verified): 1)  Singulair 10 Mg  Tabs (Montelukast Sodium) .... Once Daily 2)  Proventil Hfa 108 (90 Base) Mcg/act Aers (Albuterol Sulfate) .... As Needed 3)  Effexor Xr 150 Mg  Cp24 (Venlafaxine Hcl) .Marland Kitchen.. 1 By Mouth Once Daily 4)  Fexofenadine Hcl 180 Mg Tabs (Fexofenadine Hcl) .... Take 1 By Mouth Once Daily 5)  Tussionex Pennkinetic Er 8-10 Mg/74ml Lqcr (Chlorpheniramine-Hydrocodone) .Marland Kitchen.. 1 Tsp By Mouth At Bedtime As Needed Cough 6)  Omeprazole 40 Mg Cpdr (Omeprazole) .... Take 1 Tablet By Mouth Once A Day 7)  Tussionex Pennkinetic Er 8-10 Mg/67ml Lqcr (Chlorpheniramine-Hydrocodone) .Marland Kitchen.. 1 Tsp By Mouth At Bedtime As Needed 8)  Nebulizer Compressor  Misc (Nebulizers) .... Breathing Treatment As Needed 9)  Prednisone 10 Mg Tabs (Prednisone) .... Take As  Directed 10)  Alprazolam 0.5 Mg Tabs (Alprazolam) .... One Tablet By Mouth At Bedtime Prn 11)  Zithromax Z-Pak 250 Mg Tabs (Azithromycin) .... As Directed 12)  Nasonex 50 Mcg/act Susp (Mometasone Furoate) .... 2 Squirts Each Nostril Daily 13)  Qvar 80 Mcg/act  Aers (Beclomethasone Dipropionate) .... Two  Puffs Twice Daily 14)  Duoneb 0.5-2.5 (3) Mg/47ml Soln (Ipratropium-Albuterol) .... Take 4 Times Daily As Needed  Allergies: 1)  ! Sulfa 2)  ! * Flovent 3)  ! * Symbicort 4)  ! Elwin Sleight  Past History:  Past medical, surgical, family and social histories (including risk factors) reviewed for relevance to current acute and chronic problems.  Past Medical History: Reviewed history from 07/14/2009 and no changes required. 1) ABPA x 12/14/2007  -> IgE 605 on 11/21/2007 (1 month after steroid burst), Eosinophils 10.4% of 5600 cells, Preciptin positive for fumigatus/nidulans/niger, Asp. skin test positive 11/21/2007, CT scan 2005 &  11/22/2007 unchanged bilateral apical scarring and RUL anterior segment bronchiectasis   2) ASTHMA x age 73 months. .......Marland KitchenRamaswamy >Triggers: harsh winter and viruses Hx of subjective intolerance to ICS - "weight gain" >  Prior to singulair 02/1992 -> hospitalized 3-4 times/year. Since then, zero hospitalization/er visits.  > Improved asthma control after moving [place] ->  [place] in 2004 . "Failed" xolair in 2004 per hx > 8 flares & prednisone bursts May 2008 -> May 2009.  > Commenced daily prednisone 5mg  daily 12/14/2007  s/p  Itraconazole 05/12/2008 >  10/07/2008 (only 1 flare Rx at home in April 2010)   # PFTs 2007 -  FEV1 1.75/76%, FVC 2.88, FEV1/FVC ratio  61%, TLC at 100%, DLCO 82% PFTs 12/04/2007 at end of pred burst - Fev1 1.7/68%, TLC 5.2/104%, DLCO 17.8/77% -> start daily pred Cleda Daub 02/01/2008  - FEv1 1.62/71%,  Spior 04/21/2008 - Fev1 1.35L/53% -> start itraconazole Spiro 08/01/2008 - Fev1 1.43L/54% ->  cont  itraconazole/daily pred Sprio 10/07/08 - Not done -> dc  itraconazole Cleda Daub 11/25/08 - Fev1 1.22L/48% (flare x 2) -> recommence pred & itra -> but she dc'ed itra due to chest pains.  Cleda Daub 01/08/09- Fev1 1.81L/72% (best ever) Cleda Daub 1  # Obesity - BMI 30 -> "secondary to flovent in 1996"  #) Maxillary Sinusitis 2007.Marland KitchenMarland KitchenDr Jearld Fenton  09/2008 - re-referred to ENT  # Depression  # Osteoarthritis / Osteopenia  - on fosmax   # IBS  # Hyperlipidemia  # Mod OSA by hx in 2007.  AHI 22.5 02/2606 Not on CPAP. Refused sleep re-referral in 11/2007  # Pulmonary nodules - small stable nodules:2006 & summer 2007 ct chest. Not present at [facility] 11/2007.   #Hx of recurent Hives since highschool > resolved 04/2008 - 10/2008 while on itracconazole for abpa   > recurred 6/10 while off itraconazole but attributed to vit d and flax seed on visit 12/2008  Past Surgical History: Reviewed history from 11/25/2008 and no changes required. Appendectomy Caesarean section Hx of normal stress test 10/2008  Family History: Reviewed history from 12/11/2006 and no changes required. Family History Diabetes 1st degree relative Family History Other cancer-Breast  Social History: Reviewed history from 11/16/2007 and no changes required. Never Smoked Alcohol use-yes Drug use-no Married 1 son with cerebral palsy. Lot of aides at home for son  Review of Systems      See HPI  Physical Exam  General:  Well-developed,well-nourished,in no acute distress; alert,appropriate and cooperative throughout examination Eyes:  R eye--+ ecchymosis, swelling--healing Msk:  normal ROM.   Neurologic:  alert & oriented X3, cranial nerves II-XII intact, sensation intact to light touch, and gait normal.   Skin:  stitches x3 removed --no complications wound healing well. Psych:  Oriented X3 and normally interactive.     Impression & Recommendations:  Problem # 1:  LACERATION, FOREHEAD (ICD-873.42) over R eye---3 stitches removed  Problem # 2:  HEADACHE (ICD-784.0)  this occurred  while under stress and has not occurred since pt advised to seek medical care if occurs again  Orders: Radiology Referral (Radiology)  Complete Medication List: 1)  Singulair 10 Mg Tabs (Montelukast sodium) .... Once daily 2)  Proventil Hfa 108 (90 Base) Mcg/act Aers (Albuterol sulfate) .... As needed 3)  Effexor Xr 150 Mg Cp24 (Venlafaxine hcl) .Marland Kitchen.. 1 by mouth once daily 4)  Fexofenadine Hcl 180 Mg Tabs (Fexofenadine hcl) .... Take 1 by mouth once daily 5)  Tussionex Pennkinetic Er 8-10 Mg/39ml Lqcr (Chlorpheniramine-hydrocodone) .Marland Kitchen.. 1 tsp by mouth at bedtime as needed cough 6)  Omeprazole 40 Mg Cpdr (Omeprazole) .... Take 1 tablet by mouth once a day 7)  Tussionex Pennkinetic Er 8-10 Mg/21ml Lqcr (Chlorpheniramine-hydrocodone) .Marland Kitchen.. 1 tsp by mouth at bedtime as needed 8)  Nebulizer Compressor Misc (Nebulizers) .... Breathing treatment as needed 9)  Prednisone 10 Mg Tabs (Prednisone) .Marland KitchenMarland KitchenMarland Kitchen  Take as directed 10)  Alprazolam 0.5 Mg Tabs (Alprazolam) .... One tablet by mouth at bedtime prn 11)  Zithromax Z-pak 250 Mg Tabs (Azithromycin) .... As directed 12)  Nasonex 50 Mcg/act Susp (Mometasone furoate) .... 2 squirts each nostril daily 13)  Qvar 80 Mcg/act Aers (Beclomethasone dipropionate) .... Two  puffs twice daily 14)  Duoneb 0.5-2.5 (3) Mg/31ml Soln (Ipratropium-albuterol) .... Take 4 times daily as needed

## 2010-07-20 NOTE — Progress Notes (Signed)
Summary: PRESCRIPT  Phone Note From Other Clinic Call back at [phone] OPT3   Caller: Patient Caller: [facility] DELIVERY [facility] Call For: [facility] Summary of Call: CALLING ABOUT Victoria [name] FOR [facility] AND [facility]  REF # [id number] Initial call taken by: Rickard Patience,  September 03, 2009 11:34 AM  Follow-up for Phone Call        MR has paperwork in his look-at, I will get him to sign later today.Carron Curie CMA  September 03, 2009 11:38 AM  refill paperwork faxed.Carron Curie CMA  September 03, 2009 5:12 PM

## 2010-07-20 NOTE — Progress Notes (Signed)
Summary: prednisone qty  Phone Note Call from Patient Call back at [facility] Phone [phone]   Caller: Patient Call For: ramaswamy Summary of Call: pt needs to speak to nurse re: prednisone. Initial call taken by: Tivis Ringer, CNA,  December 02, 2009 3:48 PM  Follow-up for Phone Call        Spoke with pt.  She states that she needs 1 mg of prednisone called to pharm. Requesting #120 tablets.  Last pt instructions from ov on 10/28/09 state: 6)  prednisone 10mg   per day 7)  try to taper prednisone slowly down to 5mg  per day 8)  try to achieve 5mg  per day over 5 months 9)  return in 6 months  Pt states that MR told her to decrease prednisone by 1 mg every month. She thinks that she needs to be on 9 mg daily this month  Please advise if this is okay to call in for pt.  She seems to be confused, thanks! Follow-up by: Vernie Murders,  December 02, 2009 4:10 PM  Additional Follow-up for Phone Call Additional follow up Details #1::        ok to call in prednisone 1mg  tabs  #120, as directed.  no fills until she discusses with MR Additional Follow-up by: Barbaraann Share MD,  December 02, 2009 5:18 PM    Additional Follow-up for Phone Call Additional follow up Details #2::    Rx was sent and pt aware. Follow-up by: Vernie Murders,  December 02, 2009 5:19 PM  New/Updated Medications: PREDNISONE 1 MG TABS (PREDNISONE) take as directed Prescriptions: PREDNISONE 1 MG TABS (PREDNISONE) take as directed  #120 x 0   Entered by:   Vernie Murders   Authorized by:   Kalman Shan MD   Signed by:   Vernie Murders on 12/02/2009   Method used:   Electronically to        [facility]  [id number] ([facility])       [address]       [place], [place]  [address]       Ph: [phone] or [phone]       Fax: [phone]   RxID:   [id number]

## 2010-07-20 NOTE — Letter (Signed)
Summary: [facility] INFECTION RECHECK  [facility] INFECTION RECHECK   Imported By: Freddy Jaksch 05/14/2007 10:21:34  _____________________________________________________________________  External Attachment:    Type:   Image     Comment:   External Document

## 2010-07-20 NOTE — Progress Notes (Signed)
Summary: asthma  Phone Note Call from Patient   Caller: Patient Call For: ramaswamy Summary of Call: pt had asthma flare up coughing Initial call taken by: Rickard Patience,  December 24, 2007 4:17 PM  Follow-up for Phone Call        Spoke with pt.  She c/o prod cough with brownish yellow sputum x 3 days.  She states she had an "asthma flare" 2 days ago, symptoms somewhat resolved after she increased pred to 30 mg per day.  She states that these were part of Dr. Jane Canary instructions for her.  I offered her an appt to see TP tommorrow.  Pt refused.  Stated that she "just wants to give an update to Dr. Marchelle Gearing".   She is aware that Marchelle Gearing is out of the office until 12-31-07. Follow-up by: Vernie Murders,  December 24, 2007 4:34 PM

## 2010-07-20 NOTE — Progress Notes (Signed)
Summary: sick- lmomtcb x 1  Phone Note Call from Patient Call back at [facility] Phone [phone]   Caller: Patient Call For: Keonia Pasko Reason for Call: Talk to Nurse Summary of Call: not doing well, wheezing, congested, sob, coughing up beige plegm, doing her breathing treatments.  Mr has been talking to her about a trial at [facility].  Would like to get the name iof it, so she can read up on it.Please advise Initial call taken by: Eugene Gavia,  October 16, 2008 4:00 PM  Follow-up for Phone Call        lmomtcb Victoria [name]  October 16, 2008 4:14 PM  called and spoke with pt. pt states she recently talked with you about a trial at [facility].  Don't see anything in your notes regarding this.  please advise.  Also, pt wanted to inform you of her symptoms:  coughing up beiged colored sputum,  wheezing, tightness in chest, and increased sob.  pt states she increased her pred. to 40mg  a day.  pt states she does not think she will be able to taper down to 4 mg of pred.  pt states she thinks this is too low.   please advise. thanks. Aundra Millet Reynolds LPN  October 17, 2008 10:53 AM  Follow-up by: Kalman Shan MD,  Oct 20, 2008 1:16 PM  Additional Follow-up for Phone Call Additional follow up Details #1::        1. She needs to restart her symbicort. Please aks her if she is willing to do this. She had self dc'ed it  2. This is the 2nd flare on prednisone 4mg  alternate day. Please tell her that we are now learning she cannot handle this low dose. I will see her antyime this next 2 weeks to write a new prednisone regimen for her that will probably end up being at 5-10mg  alternate day as the lowest dose  3. If she is getting worse, she needs to make an acute visit today/tommorrow or go to ER  4. The [facility] referral is NOT a trial. It is called THERMOPLASTY a new Rx approved by FDA last week. I will do the referral to [name] now. She can see her when she gets appt Additional Follow-up by: Kalman Shan MD,   Oct 20, 2008 1:19 PM    Additional Follow-up for Phone Call Additional follow up Details #2::    Called spoke with pt.  Advised per MR for pt to restart Symbicort Inhale 2 puffs two times a day.  Pt agrees.  Advised pt MR suspects pred dosage too low.  Pt incr dosage of pred over weekend 40mg  x 3 days, now on 30mg  once daily x 3 days will continue to taper down to 5mg /10mg  every other day.  Pt states she is feeling much better and declined appt.  Advised pt of Thermoplasty tx offered at [facility].  Pt states [facility] called her with appt with Dr Marisa Sprinkles.  Advised pt TCB if worsens and needs OV. Follow-up by: Cloyde Reams RN,  Oct 20, 2008 4:59 PM  Additional Follow-up for Phone Call Additional follow up Details #3:: Details for Additional Follow-up Action Taken: ok great. thanks Additional Follow-up by: Kalman Shan MD,  Oct 21, 2008 1:57 PM

## 2010-07-20 NOTE — Progress Notes (Signed)
Summary: prescript  Phone Note Call from Patient   Caller: Patient Call For: ramaswamy Summary of Call: need prescript for omnicef [facility] [address] Initial call taken by: Rickard Patience,  July 14, 2009 2:20 PM  Follow-up for Phone Call        called and spoke with pt.  pt states she saw MR today and was given multiple prescriptions to send off to her mail order pharmacy but states the [facility] wasn't one of them.  I informed pt that Omnicef and Prednisone were sent to local pharmacy.  Pt was ok with this.  Pt also asked a 1 month supply of Omeprazole be called into local pharmacy since she is completely out and it will be a few days before she receives rx in the mail.  informed pt i would send rx for omeprazole to local pharmacy.  Aundra Millet Reynolds LPN  July 14, 2009 2:50 PM     Prescriptions: OMEPRAZOLE 40 MG CPDR (OMEPRAZOLE) Take 1 tablet by mouth once a day  #30 x 0   Entered by:   Arman Filter LPN   Authorized by:   Kalman Shan MD   Signed by:   Arman Filter LPN on 16/03/9603   Method used:   [facility]  [address] [id number]       [address]       [place], [place]  [address]       Ph: [phone] or [phone]       Fax: [phone]   RxID:   [id number]

## 2010-07-20 NOTE — Assessment & Plan Note (Signed)
Summary: still having the headaches/drb   Vital Signs:  Patient profile:   55 year old female Weight:      174 pounds Temp:     98.4 degrees F oral Pulse rate:   86 / minute Pulse rhythm:   regular BP sitting:   128 / 84  (left arm) Cuff size:   regular  Vitals Entered By: Army Fossa CMA (August 20, 2009 10:51 AM) CC: Pt here c/o of pains in top of head and in base of neck having some tingling in right arm.   History of Present Illness: Pt here c/o con't strange feeling in scalp but now has numbness and tingling in L arm.   No new complaints.   No headaches.    Current Medications (verified): 1)  Singulair 10 Mg  Tabs (Montelukast Sodium) .... Once Daily 2)  Proventil Hfa 108 (90 Base) Mcg/act Aers (Albuterol Sulfate) .... As Needed 3)  Effexor Xr 150 Mg  Cp24 (Venlafaxine Hcl) .Marland Kitchen.. 1 By Mouth Once Daily 4)  Fexofenadine Hcl 180 Mg Tabs (Fexofenadine Hcl) .... Take 1 By Mouth Once Daily 5)  Tussionex Pennkinetic Er 8-10 Mg/26ml Lqcr (Chlorpheniramine-Hydrocodone) .Marland Kitchen.. 1 Tsp By Mouth At Bedtime As Needed Cough 6)  Omeprazole 40 Mg Cpdr (Omeprazole) .... Take 1 Tablet By Mouth Once A Day 7)  Nebulizer Compressor  Misc (Nebulizers) .... Breathing Treatment As Needed 8)  Prednisone 10 Mg Tabs (Prednisone) .... Take 1 Tablet By Mouth Once A Day 9)  Alprazolam 0.5 Mg Tabs (Alprazolam) .... One Tablet By Mouth At Bedtime Prn 10)  Nasonex 50 Mcg/act Susp (Mometasone Furoate) .... 2 Squirts Each Nostril Daily 11)  Duoneb 0.5-2.5 (3) Mg/44ml Soln (Ipratropium-Albuterol) .... Take 4 Times Daily As Needed 12)  Asmanex 120 Metered Doses 220 Mcg/inh  Aepb (Mometasone Furoate) .... One or Two Puffs Nightly 13)  Spiriva Handihaler 18 Mcg  Caps (Tiotropium Bromide Monohydrate) .... One Puffs in Handihaler Daily 14)  Flexeril 10 Mg Tabs (Cyclobenzaprine Hcl) .Marland Kitchen.. 1 By Mouth Three Times A Day  Allergies: 1)  ! Sulfa 2)  ! * Flovent 3)  ! * Symbicort 4)  ! * Dulera 5)  ! * Qvar  Past  History:  Past medical, surgical, family and social histories (including risk factors) reviewed for relevance to current acute and chronic problems.  Past Medical History: Reviewed history from 08/11/2009 and no changes required. 1) ABPA x 12/14/2007  -> IgE 605 on 11/21/2007 (1 mo after steroid burst), Eos 10.4% of 5600 cells, Preciptin positive for fumigatus/nidulans/niger, Asp. skin test positive 11/21/2007, CT scan 2005 &  11/22/2007 unchanged bilateral apical scarring and RUL anterior segment bronchiectasis   2) ASTHMA x age 21 months. ....Marland KitchenMarland KitchenRamaswamy >Triggers: harsh winter and viruses Hx of subjective intolerance to ICS - "weight gain" >  Prior to singulair 02/1992 -> hospitalized 3-4 times/year. Since then, zero hospitalization/er visits.  > Improved asthma control after moving [place] -> [place] in 2004 . "Failed" xolair in 2004 per hx > 8 flares & prednisone bursts May 2008 -> May 2009.  > Commenced daily prednisone 5mg  daily 12/14/2007  s/p  Itraconazole 05/12/2008 >  10/07/2008 (only 1 flare Rx at home in 4/ 2010)   # PFTs 2007 -  FEV1 1.75/76%, TLC at 100%, DLCO 82% PFTs 12/04/2007 at end of pred burst - Fev1 1.7/68%, TLC 5.2/104%, DLCO 17.8/77% -> start daily pred Cleda Daub 02/01/2008  - FEv1 1.62/71%,  Spior 04/21/2008 - Fev1 1.35L/53% -> start itraconazole Cleda Daub 08/01/2008 - Fev1  1.43L/54% ->  cont  itraconazole/daily pred Post itraconazole Cleda Daub 11/25/08 - Fev1 1.22L/48% (flare x 2) -> recommence pred & itra -> but she dc'ed itra due to chest pains.  Cleda Daub 01/08/09- Fev1 1.81L/72% (best ever) Cleda Daub 07/14/09 - Fev1 0.98L/36%, Rx a  # Obesity - BMI 30 > "secondary to flovent in 1996"  #) Maxillary Sinusitis 2007.Marland KitchenMarland KitchenDr Jearld Fenton  09/2008 - re-referred to ENT  # Depression  # Osteoarthritis / Osteopenia  - on fosmax   # IBS  # Hyperlipidemia  # Mod OSA by hx in 2007.  AHI 22.5 02/2606  Refused CPAP and sleep re-referral in 11/2007  # Pulmonary nodules - small stable nodules:2006 & summer 2007 ct  chest. Not [facility] 11[facility].   #Hx of recurent Hives since highschool > resolved 04/2008 - 10/2008 while on itracconazole for abpa   > recurred 6/10 while off itraconazole but attributed to vit d and flax seed on visit 12/2008 > Jan 2011 - attributed to qvar  Past Surgical History: Reviewed history from 11/25/2008 and no changes required. Appendectomy Caesarean section Hx of normal stress test 10/2008  Family History: Reviewed history from 12/11/2006 and no changes required. Family History Diabetes 1st degree relative Family History Other cancer-Breast  Social History: Reviewed history from 11/16/2007 and no changes required. Never Smoked Alcohol use-yes Drug use-no Married 1 son with cerebral palsy. Lot of aides at home for son  Review of Systems      See HPI  Physical Exam  General:  Well-developed,well-nourished,in no acute distress; alert,appropriate and cooperative throughout examination Eyes:  pupils equal, pupils round, and pupils reactive to light.   Neck:  + muscle spams R trap + full ROM Msk:  normal ROM, no joint tenderness, no joint swelling, no joint warmth, and no redness over joints.   Skin:  Intact without suspicious lesions or rashes Cervical Nodes:  No lymphadenopathy noted Psych:  Oriented X3 and normally interactive.     Impression & Recommendations:  Problem # 1:  ARM NUMBNESS (ICD-782.0)  Orders: T-Cervicle Spine 2-3 Views (62130QM)  Problem # 2:  NECK SPRAIN AND STRAIN (ICD-847.0)  Her updated medication list for this problem includes:    Flexeril 10 Mg Tabs (Cyclobenzaprine hcl) .Marland Kitchen... 1 by mouth three times a day  Discussed exercises and use of moist heat or cold and medication.   Complete Medication List: 1)  Singulair 10 Mg Tabs (Montelukast sodium) .... Once daily 2)  Proventil Hfa 108 (90 Base) Mcg/act Aers (Albuterol sulfate) .... As needed 3)  Effexor Xr 150 Mg Cp24 (Venlafaxine hcl) .Marland Kitchen.. 1 by mouth once daily 4)   Fexofenadine Hcl 180 Mg Tabs (Fexofenadine hcl) .... Take 1 by mouth once daily 5)  Tussionex Pennkinetic Er 8-10 Mg/21ml Lqcr (Chlorpheniramine-hydrocodone) .Marland Kitchen.. 1 tsp by mouth at bedtime as needed cough 6)  Omeprazole 40 Mg Cpdr (Omeprazole) .... Take 1 tablet by mouth once a day 7)  Nebulizer Compressor Misc (Nebulizers) .... Breathing treatment as needed 8)  Prednisone 10 Mg Tabs (Prednisone) .... Take 1 tablet by mouth once a day 9)  Alprazolam 0.5 Mg Tabs (Alprazolam) .... One tablet by mouth at bedtime prn 10)  Nasonex 50 Mcg/act Susp (Mometasone furoate) .... 2 squirts each nostril daily 11)  Duoneb 0.5-2.5 (3) Mg/54ml Soln (Ipratropium-albuterol) .... Take 4 times daily as needed 12)  Asmanex 120 Metered Doses 220 Mcg/inh Aepb (Mometasone furoate) .... One or two puffs nightly 13)  Spiriva Handihaler 18 Mcg Caps (Tiotropium bromide monohydrate) .Marland KitchenMarland KitchenMarland Kitchen  One puffs in handihaler daily 14)  Flexeril 10 Mg Tabs (Cyclobenzaprine hcl) .Marland Kitchen.. 1 by mouth three times a day Prescriptions: FLEXERIL 10 MG TABS (CYCLOBENZAPRINE HCL) 1 by mouth three times a day  #30 x 0   Entered and Authorized by:   [name]   Signed by:   [name] on 08/20/2009   Method used:   [facility]  [address] [id number]       [address]       [place], [place]  [address]       Ph: [phone] or [phone]       Fax: [phone]   RxID:   [id number]

## 2010-07-20 NOTE — Progress Notes (Signed)
Summary: prednisone rx-pt returned call  Phone Note Call from Patient Call back at [facility] Phone [phone]   Caller: Patient Call For: Victoria [name] Summary of Call: pt requests new rx for 5mg  prednisone- [facility] home delivery. pt says she has continued to decrease mg on her taper. is now down to 6mg  and "doing very well".  Initial call taken by: Tivis Ringer, CNA,  January 27, 2010 10:49 AM  Follow-up for Phone Call        [facility] x 1 Zackery Barefoot [facility]  January 27, 2010 12:21 PM  pt returned call from triage. Tivis Ringer, CNA  January 27, 2010 12:25 PM   Additional Follow-up for Phone Call Additional follow up Details #1::        MR, please advise if ok to send this RX and directions(looks as though there are 2 different RX's on her med list).Thanks.Reynaldo Minium CMA  January 27, 2010 3:11 PM     Additional Follow-up for Phone Call Additional follow up Details #2::    give her what shewants on the pred and we should updated the med list. She self directs this. Please ensure fu baesd on prior ov note Follow-up by: Kalman Shan MD,  January 28, 2010 1:32 PM  Additional Follow-up for Phone Call Additional follow up Details #3:: Details for Additional Follow-up Action Taken: MR we need you to write what you want to give on the prednisone, the 5mg  tabs would be a new rx for her, the 10mg  and 1mg  is on the list, so just type out what you want [name] to send even if you want it to be use as directed we can not make this decision.  thanks    Philipp Deputy CMA  January 28, 2010 2:02 PM     OK Thanks. SHe should take prednisone 5mg  daily. Dispense as 1mg  tablets or 5mg  tablets per patient choice. Give for 30 days with 5 refills  Spoke with pt.  She states that she would like 5 mg tabs sent to [facility] tele drug- 90 days supply.  Rx was sent to pharm and pt aware. Vernie Murders  January 29, 2010 2:56 PM  Additional Follow-up by: Kalman Shan MD,  January 29, 2010 2:46 PM  New/Updated  Medications: PREDNISONE 5 MG TABS (PREDNISONE) 1 by mouth once daily Prescriptions: PREDNISONE 5 MG TABS (PREDNISONE) 1 by mouth once daily  #90 x 1   Entered by:   Vernie Murders   Authorized by:   Kalman Shan MD   Signed by:   Vernie Murders on 01/29/2010   Method used:   Faxed to ...       [address] [facility] (mail-order)       [address]       [place], [place]  [address]       Ph: [phone]       Fax: [phone]   RxID:   [id number]

## 2010-07-20 NOTE — Assessment & Plan Note (Signed)
Summary: COUGH W/CONGESTION,HAS ASTHMA/RH....   Vital Signs:  Patient profile:   55 year old female Height:      64 inches (162.56 cm) Weight:      178 pounds (80.91 kg) Temp:     98.4 degrees F (36.89 degrees C) oral Pulse rate:   82 / minute BP sitting:   130 / 90  Vitals Entered By: Kandice Hams (June 16, 2009 2:22 PM) CC: c/o cough non productive,congestion, took prednisone 15mg  sat ,sun mon, and 40mg  today Comments  - patient states that she is on Prednisone 10mg  once daily & needs new rx for #60 to local pharmacy because when she started getting sick over christmas she increased her dosage. Shary Decamp  June 16, 2009 3:17 PM    History of Present Illness: sick x  4 days  c/o cough non productive, chest congestion she increase her 10 mg of prednisone daily to 50 mg initially and now is on 40 mg daily. Overall, feels a slight better.     Current Medications (verified): 1)  Singulair 10 Mg  Tabs (Montelukast Sodium) .... Once Daily 2)  Proventil Hfa 108 (90 Base) Mcg/act Aers (Albuterol Sulfate) .... As Needed 3)  Effexor Xr 150 Mg  Cp24 (Venlafaxine Hcl) .Marland Kitchen.. 1 By Mouth Once Daily 4)  Fexofenadine Hcl 180 Mg Tabs (Fexofenadine Hcl) .... Take 1 By Mouth Once Daily 5)  Tussionex Pennkinetic Er 8-10 Mg/90ml Lqcr (Chlorpheniramine-Hydrocodone) .Marland Kitchen.. 1 Tsp By Mouth At Bedtime As Needed Cough 6)  Omeprazole 40 Mg Cpdr (Omeprazole) .... Take 1 Tablet By Mouth Once A Day 7)  Tussionex Pennkinetic Er 8-10 Mg/44ml Lqcr (Chlorpheniramine-Hydrocodone) .Marland Kitchen.. 1 Tsp By Mouth At Bedtime As Needed 8)  Nebulizer Compressor  Misc (Nebulizers) .... Breathing Treatment As Needed 9)  Asmanex 120 Metered Doses 220 Mcg/inh Aepb (Mometasone Furoate) .... Two Puffs Twice Daily 10)  Cefdinir 300 Mg Caps (Cefdinir) .... One Cap By Mouth Two Times A Day X 7 Days 11)  Prednisone 10 Mg Tabs (Prednisone) .... Take As Directed 12)  Alprazolam 0.5 Mg Tabs (Alprazolam) .... One Tablet By Mouth At  Bedtime Prn  Allergies (verified): 1)  ! Sulfa 2)  ! * Flovent 3)  ! * Symbicort  Past History:  Past Medical History: Reviewed history from 01/08/2009 and no changes required. 1) ABPA x 12/14/2007  -> IgE 605 on 11/21/2007 (1 month after steroid burst), Eosinophils 10.4% of 5600 cells, Preciptin positive for fumigatus/nidulans/niger, Asp. skin test positive 11/21/2007, CT scan 2005 &  11/22/2007 unchanged bilateral apical scarring and RUL anterior segment bronchiectasis   2) ASTHMA x age 52 months. .......Marland KitchenRamaswamy >Triggers: harsh winter and viruses Hx of subjective intolerance to ICS - "weight gain" >  Prior to singulair 02/1992 -> hospitalized 3-4 times/year. Since then, zero hospitalization/er visits.  > Improved asthma control after moving [place] -> [place] in 2004 . "Failed" xolair in 2004 per hx > 8 flares & prednisone bursts May 2008 -> May 2009.  > Commenced daily prednisone 5mg  daily 12/14/2007  s/p  Itraconazole 05/12/2008 >  10/07/2008 (only 1 flare Rx at home in April 2010)   # PFTs 2007 -  FEV1 1.75/76%, FVC 2.88, FEV1/FVC ratio  61%, TLC at 100%, DLCO -  82% PFTs 12/04/2007 at end of pred burst - Fev1 1.7/68%, TLC 5.2/104%, DLCO 17.8/77% -> start daily pred Cleda Daub 02/01/2008  - FEv1 1.62/71%,  Spior 04/21/2008 - Fev1 1.35L/53% -> start itraconazole Cleda Daub 08/01/2008 - Fev1 1.43L/54% ->  cont  itraconazole/daily pred  Sprio 10/07/08 - Not done -> dc itraconazole Cleda Daub 11/25/08 - Fev1 1.22L/48% (flare x 2) -> recommence pred & itra -> but she dc'ed itra due to chest pains.  Cleda Daub 7/22/1o- Fev1 1.81L/72% (best ever)  # Obesity - BMI 30 -> "secondary to flovent in 1996"  #) Maxillary Sinusitis 2007.Marland KitchenMarland KitchenDr Jearld Fenton  09/2008 - re-referred to ENT  # Depression  # Osteoarthritis / Osteopenia  - on fosmax   # IBS  # Hyperlipidemia  # Mod OSA by hx in 2007.  AHI 22.5 02/2606 Not on CPAP. Refused sleep re-referral in 11/2007  # Pulmonary nodules - small stable nodules:2006 & summer 2007 ct chest.  Not [facility] 11[facility].   #Hx of recurent Hives since highschool > resolved 04/2008 - 10/2008 while on itracconazole for abpa   > recurred 6/10 while off itraconazole but attributed to vit d and flax seed on visit 12/2008  Past Surgical History: Reviewed history from 11/25/2008 and no changes required. Appendectomy Caesarean section Hx of normal stress test 10/2008  Social History: Reviewed history from 11/16/2007 and no changes required. Never Smoked Alcohol use-yes Drug use-no Married 1 son with cerebral palsy. Lot of aides at home for son  Review of Systems General:  Denies fever; several family members affected. ENT:  had sinus pain-congestion, now better . Resp:  sputum is at baseline, shortness of breath  slightly worse than usual. GI:  Denies diarrhea, nausea, and vomiting. MS:  Denies muscle aches.  Physical Exam  General:  alert and well-developed.  non-apparent distress Ears:  R ear normal and L ear normal.   Nose:  no nasal congestion Mouth:  pharynx pink and moist.   Neck:    Lungs:  bilateral exp wheeze, few rhonchi, no rales. No increase work of breathing Heart:  Normal rate and regular rhythm. S1 and S2 normal without gallop, murmur, click, rub or other extra sounds.   Impression & Recommendations:  Problem # 1:  ACUTE BRONCHITIS (ICD-466.0) acute exacerbation of respiratory symptoms , already better with increase dosage of her prednisone. Continue with the taper of prednisone: see instructions. Will prescribe a Z-Pak, to be used only  if she's not better in a few days  Her updated medication list for this problem includes:    Singulair 10 Mg Tabs (Montelukast sodium) ..... Once daily    Proventil Hfa 108 (90 Base) Mcg/act Aers (Albuterol sulfate) .Marland Kitchen... As needed    Tussionex Pennkinetic Er 8-10 Mg/53ml Lqcr (Chlorpheniramine-hydrocodone) .Marland Kitchen... 1 tsp by mouth at bedtime as needed cough    Tussionex Pennkinetic Er 8-10 Mg/34ml Lqcr  (Chlorpheniramine-hydrocodone) .Marland Kitchen... 1 tsp by mouth at bedtime as needed    Asmanex 120 Metered Doses 220 Mcg/inh Aepb (Mometasone furoate) .Marland Kitchen..Marland Kitchen Two puffs twice daily    Zithromax Z-pak 250 Mg Tabs (Azithromycin) .Marland Kitchen... As directed  Problem # 2:  A B P A-ALLERGIC BRONCHOPULMONARY ASPERGILLOSIS (ICD-518.6) patient is requesting a refill on prednisone, I will go ahead and do that but advised that  she  needs to see her pulmonologist regularly  If she is to be on chronic steroids  Complete Medication List: 1)  Singulair 10 Mg Tabs (Montelukast sodium) .... Once daily 2)  Proventil Hfa 108 (90 Base) Mcg/act Aers (Albuterol sulfate) .... As needed 3)  Effexor Xr 150 Mg Cp24 (Venlafaxine hcl) .Marland Kitchen.. 1 by mouth once daily 4)  Fexofenadine Hcl 180 Mg Tabs (Fexofenadine hcl) .... Take 1 by mouth once daily 5)  Tussionex Pennkinetic Er 8-10 Mg/14ml Lqcr (  Chlorpheniramine-hydrocodone) .Marland Kitchen.. 1 tsp by mouth at bedtime as needed cough 6)  Omeprazole 40 Mg Cpdr (Omeprazole) .... Take 1 tablet by mouth once a day 7)  Tussionex Pennkinetic Er 8-10 Mg/29ml Lqcr (Chlorpheniramine-hydrocodone) .Marland Kitchen.. 1 tsp by mouth at bedtime as needed 8)  Nebulizer Compressor Misc (Nebulizers) .... Breathing treatment as needed 9)  Asmanex 120 Metered Doses 220 Mcg/inh Aepb (Mometasone furoate) .... Two puffs twice daily 10)  Prednisone 10 Mg Tabs (Prednisone) .... Take as directed 11)  Alprazolam 0.5 Mg Tabs (Alprazolam) .... One tablet by mouth at bedtime prn 12)  Zithromax Z-pak 250 Mg Tabs (Azithromycin) .... As directed  Patient Instructions: 1)  raised 2)  fluids 3)  prednisone: 40 mg x2 days, 30x2,20x2, then 10mg  once daily  4)  Robitussin-DM 5)  nebulizations 4 times a day until you feel better. 6)  Get a Z-Pak if you are not better in a few days. 7)  ER if you feel a lot  worse, high fever, or difficulty breathing Prescriptions: PREDNISONE 10 MG TABS (PREDNISONE) take as directed  #60 x 0   Entered and Authorized by:    Nolon Rod. Caeli Linehan MD   Signed by:   Nolon Rod. Martika Egler MD on 06/16/2009   Method used:   Print then Give to Patient   RxID:   [id number] ZITHROMAX Z-PAK 250 MG TABS (AZITHROMYCIN) as directed  #one x 0   Entered and Authorized by:   Nolon Rod. Intisar Claudio MD   Signed by:   Nolon Rod. Shterna Laramee MD on 06/16/2009   Method used:   Print then Give to Patient   RxID:   [id number]

## 2010-07-20 NOTE — Assessment & Plan Note (Signed)
Summary: ABDOMINAL PAIN/KN   Vital Signs:  Patient profile:   55 year old female Weight:      168.2 pounds BMI:     28.98 Temp:     98.2 degrees F oral Pulse rate:   88 / minute BP sitting:   116 / 70  (left arm) Cuff size:   regular  Vitals Entered By: Shonna Chock CMA (January 15, 2010 1:02 PM) CC: Abdominal pain: ongoing concern, Abdominal Pain   History of Present Illness:       This is a 55 year old woman who presents with Abdominal Pain.  The symptoms began 4-8 weeks ago.  The patient denies nausea, vomiting, diarrhea, constipation, melena, hematochezia, anorexia, and hematemesis.  The location of the pain is epigastric and periumbilical.  The pain is described as constant and dull.  The patient denies the following symptoms: fever, weight loss, dysuria, chest pain, jaundice, dark urine, missed menstrual period, and vaginal bleeding.  The pain is better with rest.  pt saw surgeon--- She felt it was not a hernia but pt states pain is increasing.    Current Medications (verified): 1)  Singulair 10 Mg  Tabs (Montelukast Sodium) .... Twice  Daily 2)  Proventil Hfa 108 (90 Base) Mcg/act Aers (Albuterol Sulfate) .... As Needed 3)  Effexor Xr 150 Mg  Cp24 (Venlafaxine Hcl) .Marland Kitchen.. 1 By Mouth Once Daily 4)  Fexofenadine Hcl 180 Mg Tabs (Fexofenadine Hcl) .... Take 1 By Mouth Once Daily 5)  Tussionex Pennkinetic Er 8-10 Mg/35ml Lqcr (Chlorpheniramine-Hydrocodone) .Marland Kitchen.. 1 Tsp By Mouth At Bedtime As Needed Cough 6)  Nebulizer Compressor  Misc (Nebulizers) .... Breathing Treatment As Needed 7)  Prednisone 10 Mg Tabs (Prednisone) .... Take 1 Tablet By Mouth Once A Day 8)  Alprazolam 0.5 Mg Tabs (Alprazolam) .... One Tablet By Mouth At Bedtime Prn 9)  Nasonex 50 Mcg/act Susp (Mometasone Furoate) .... 2 Squirts Each Nostril Daily 10)  Spiriva Handihaler 18 Mcg Caps (Tiotropium Bromide Monohydrate) .... One Puff in The Morning 11)  Asmanex 120 Metered Doses 220 Mcg/inh Aepb (Mometasone Furoate) .... 2  Puffs in The Evening 12)  Prednisone 1 Mg Tabs (Prednisone) .... Take As Directed 13)  Shaklee Osteomatrix -- Ca, Mg, Vita D .... 2 By Mouth Two Times A Day 14)  Mega Red---Omega 3 .Marland Kitchen.. 1 By Mouth Once Daily 15)  Centrum Silver  Tabs (Multiple Vitamins-Minerals) .Marland Kitchen.. 1 By Mouth Once Daily 16)  Tessalon Perles 100 Mg Caps (Benzonatate) .Marland Kitchen.. 1 By Mouth Three Times A Day As Needed 17)  Lipitor 10 Mg Tabs (Atorvastatin Calcium) .Marland Kitchen.. 1 By Mouth At Bedtime.  Allergies: 1)  ! Sulfa 2)  ! * Flovent 3)  ! * Symbicort 4)  ! * Dulera 5)  ! * Qvar  Past History:  Past medical, surgical, family and social histories (including risk factors) reviewed for relevance to current acute and chronic problems.  Past Medical History: Reviewed history from 10/28/2009 and no changes required. # ABPA x 12/14/2007 .Marland KitchenMarland KitchenMarland KitchenRamaswamy  # ASTHMA x age 76 months. ....Marland KitchenMarland KitchenRamaswamy  # Obesity - BMI 30 > "secondary to flovent in 1996"  #) Maxillary Sinusitis 2007.Marland KitchenMarland KitchenDr Jearld Fenton  09/2008 - re-referred to ENT  # Depression  # Osteoarthritis / Osteopenia  - on fosmax   # IBS  # Hyperlipidemia  # Mod OSA by hx in 2007.  AHI 22.5 02/2606  Refused CPAP and sleep re-referral in 11/2007  # Pulmonary nodules - small stable nodules:2006 & summer 2007 ct chest. Not  present [facility] 11/[facility].   #Hx of recurent Hives since highschool > resolved 04/2008 - 10/2008 while on itracconazole for abpa   > recurred 6/10 while off itraconazole but attributed to vit d and flax seed on visit 12/2008 > Jan 2011 - attributed to qvar > feb 2011 - claims that in past symbicort, advair, dulera all cause hives  Past Surgical History: Reviewed history from 11/25/2008 and no changes required. Appendectomy Caesarean section Hx of normal stress test 10/2008  Family History: Reviewed history from 12/11/2006 and no changes required. Family History Diabetes 1st degree relative Family History Other cancer-Breast  Social History: Reviewed history  from 11/16/2007 and no changes required. Never Smoked Alcohol use-yes Drug use-no Married 1 son with cerebral palsy. Lot of aides at home for son  Review of Systems      See HPI  Physical Exam  General:  Well-developed,well-nourished,in no acute distress; alert,appropriate and cooperative throughout examination Abdomen:  epigastric tenderness and periumbilical soft, no rigidity, no rebound tenderness, distended, guarding, and epigastric tenderness.   Extremities:  No clubbing, cyanosis, edema, or deformity noted with normal full range of motion of all joints.   Skin:  Intact without suspicious lesions or rashes Psych:  Oriented X3 and normally interactive.  Oriented X3 and normally interactive.     Impression & Recommendations:  Problem # 1:  ABDOMINAL PAIN OTHER SPECIFIED SITE (ICD-789.09)  Orders: Radiology Referral (Radiology)  Discussed use of medications, application of heat or cold, and exercises.   Complete Medication List: 1)  Singulair 10 Mg Tabs (Montelukast sodium) .... Twice  daily 2)  Proventil Hfa 108 (90 Base) Mcg/act Aers (Albuterol sulfate) .... As needed 3)  Effexor Xr 150 Mg Cp24 (Venlafaxine hcl) .Marland Kitchen.. 1 by mouth once daily 4)  Fexofenadine Hcl 180 Mg Tabs (Fexofenadine hcl) .... Take 1 by mouth once daily 5)  Tussionex Pennkinetic Er 8-10 Mg/72ml Lqcr (Chlorpheniramine-hydrocodone) .Marland Kitchen.. 1 tsp by mouth at bedtime as needed cough 6)  Nebulizer Compressor Misc (Nebulizers) .... Breathing treatment as needed 7)  Prednisone 10 Mg Tabs (Prednisone) .... Take 1 tablet by mouth once a day 8)  Alprazolam 0.5 Mg Tabs (Alprazolam) .... One tablet by mouth at bedtime prn 9)  Nasonex 50 Mcg/act Susp (Mometasone furoate) .... 2 squirts each nostril daily 10)  Spiriva Handihaler 18 Mcg Caps (Tiotropium bromide monohydrate) .... One puff in the morning 11)  Asmanex 120 Metered Doses 220 Mcg/inh Aepb (Mometasone furoate) .... 2 puffs in the evening 12)  Prednisone 1 Mg Tabs  (Prednisone) .... Take as directed 13)  Shaklee Osteomatrix -- Ca, Mg, Vita D  .... 2 by mouth two times a day 14)  Mega Red---omega 3  .Marland Kitchen.. 1 by mouth once daily 15)  Centrum Silver Tabs (Multiple vitamins-minerals) .Marland Kitchen.. 1 by mouth once daily 16)  Tessalon Perles 100 Mg Caps (Benzonatate) .Marland Kitchen.. 1 by mouth three times a day as needed 17)  Lipitor 10 Mg Tabs (Atorvastatin calcium) .Marland Kitchen.. 1 by mouth at bedtime.  Appended Document: ABDOMINAL PAIN/KN pt will call GI and reschedule appointment for EGD.

## 2010-07-20 NOTE — Letter (Signed)
Summary: [facility] Cancel and No Reschedule  [facility]  [address] [place], [place] [address]   Phone: [phone]  Fax: [phone]      January 06, 2010 MRN: [id number]   Victoria [name] [address] [place], [place]  [address]     You recently cancelled your endoscopic procedure at the [facility] and did not reschedule for another date.    Your provider recommended this procedure for the benefit of your health.  It is very important that you reschedule it.  Failure to do so may be to the detriment of your health.  Please call us at [phone] and we will be happy to assist you with rescheduling.    If you were referred for this procedure by another physician/provider, we will notify him/her that you did not keep your appointment.   Sincerely,   [facility]

## 2010-07-22 NOTE — Progress Notes (Signed)
Summary: Hep/Liver function Results 12/9   Phone Note Outgoing Call   Call placed by: Almeta Monas CMA Duncan Dull),  May 28, 2010 4:00 PM Call placed to: Patient Details for Reason: better but still elevated---- recheck 3 months better---keep f/u appointment   Summary of Call: Left message to call back.... Almeta Monas CMA Duncan Dull)  May 28, 2010 4:00 PM  spk with patient  and she is aware.... she will have labs rechecked when she had cholesterol checked..... Almeta Monas CMA Duncan Dull)  June 01, 2010 9:50 AM i

## 2010-07-22 NOTE — Medication Information (Signed)
Summary: Prior Authorization for Venlafaxine/[employer]  Prior Authorization for Venlafaxine/[employer]   Imported By: Lanelle Bal 07/08/2010 13:22:04  _____________________________________________________________________  External Attachment:    Type:   Image     Comment:   External Document

## 2010-07-22 NOTE — Assessment & Plan Note (Signed)
Summary: NUMBNESS OF THE FACE//PH   Vital Signs:  Patient profile:   55 year old female Weight:      173 pounds BMI:     29.80 Pulse rate:   90 / minute BP sitting:   112 / 66  (left arm)  Vitals Entered By: Doristine Devoid CMA (June 16, 2010 11:22 AM) CC: R side of face numbness x4 days    History of Present Illness: 55 yo woman here today for R sided facial numbness.  sxs started 12/24 w/ facial, ear and parietal numbness.  had fleeting sensations for the few days prior but didn't think anything of it.  had numbness Sat, Sun, Mon, Tues but none today.  numbness did extend into R side of neck and into shoulder.  has had CT scans for similar sxs- normal.  had Cspine films- normal.  no visual changes.  thought she was having some R facial droop around the mouth.  no excessive tearing of eye or drooping of lid, no impact on speech.  [facility] before being seen.  Current Medications (verified): 1)  Singulair 10 Mg  Tabs (Montelukast Sodium) .... Twice  Daily 2)  Proventil Hfa 108 (90 Base) Mcg/act Aers (Albuterol Sulfate) .... As Needed 3)  Effexor Xr 37.5 Mg Xr24h-Cap (Venlafaxine Hcl) .... 3 A Day For 1 Month Then 2 A Day For 1 Month Then 1 A Day For 1 Month 4)  Fexofenadine Hcl 180 Mg Tabs (Fexofenadine Hcl) .... Take 1 By Mouth Once Daily 5)  Tussionex Pennkinetic Er 8-10 Mg/37ml Lqcr (Chlorpheniramine-Hydrocodone) .Marland Kitchen.. 1 Tsp By Mouth At Bedtime As Needed Cough 6)  Nebulizer Compressor  Misc (Nebulizers) .... Breathing Treatment As Needed 7)  Prednisone 5 Mg Tabs (Prednisone) .... Take 1 Tablet By Mouth Once A Day 8)  Nasonex 50 Mcg/act Susp (Mometasone Furoate) .... 2 Squirts Each Nostril Daily 9)  Spiriva Handihaler 18 Mcg Caps (Tiotropium Bromide Monohydrate) .... One Puff in The Morning 10)  Asmanex 120 Metered Doses 220 Mcg/inh Aepb (Mometasone Furoate) .... 2 Puffs in The Evening 11)  Prednisone 1 Mg Tabs (Prednisone) .... Take As  Directed 12)  Shaklee Osteomatrix -- Ca, Mg, Vita D .... 2 By Mouth Two Times A Day 13)  Mega Red---Omega 3 .Marland Kitchen.. 1 By Mouth Once Daily 14)  Tessalon Perles 100 Mg Caps (Benzonatate) .Marland Kitchen.. 1 By Mouth Three Times A Day As Needed 15)  Zocor 40 Mg Tabs (Simvastatin) .Marland Kitchen.. 1 By Mouth At Bedtime  Allergies (verified): 1)  ! Sulfa 2)  ! * Flovent 3)  ! * Symbicort 4)  ! * Dulera 5)  ! * Qvar 6)  ! * Proventil Hfa  Past History:  Past medical, surgical, family and social histories (including risk factors) reviewed for relevance to current acute and chronic problems.  Past Medical History: Reviewed history from 03/19/2010 and no changes required. # ABPA x 12/14/2007 .Marland KitchenMarland KitchenMarland KitchenRamaswamy  # ASTHMA x age 45 months. ....Marland KitchenMarland KitchenRamaswamy  # Obesity - BMI 30 > "secondary to flovent in 1996"  #) Maxillary Sinusitis 2007.Marland KitchenMarland KitchenDr Jearld Fenton    - 09/2008 - re-referred to ENT  # Depression  # Osteoarthritis / Osteopenia  - on fosmax   # IBS  # Hyperlipidemia  # Mod OSA by hx in 2007.  AHI 22.5 02/2606  Refused CPAP and sleep re-referral in 11/2007  # Pulmonary nodules - small stable nodules:2006 & summer 2007 ct chest. [facility].   #  Hx of recurent Hives since highschool   - resolved 04/2008 - 10/2008 while on itracconazole for abpa    -recurred 6/10 while off itraconazole but attributed to vit d and flax seed on visit 12/2008  - Jan 2011 - attributed to qvar  - feb 2011 - hx that in past symbicort, advair, dulera all cause hives  - Sept 2011 - hx of hives with ventolin HFA but not with pro-air  Past Surgical History: Reviewed history from 11/25/2008 and no changes required. Appendectomy Caesarean section Hx of normal stress test 10/2008  Family History: Reviewed history from 06/11/2010 and no changes required. Family History Diabetes 1st degree relative Family History Other cancer-Breast-Mother Family History of Prostate Cancer:Father  Social History: Reviewed history from  11/16/2007 and no changes required. Never Smoked Alcohol use-yes Drug use-no Married 1 son with cerebral palsy. Lot of aides at home for son  Review of Systems      See HPI  Physical Exam  General:  Well-developed,well-nourished,in no acute distress; alert,appropriate and cooperative throughout examination Head:  Normocephalic and atraumatic without obvious abnormalities. No apparent alopecia or balding.  features appear symmetric w/out visible drooping Eyes:  No corneal or conjunctival inflammation noted. EOMI. Perrla. Funduscopic exam benign, without hemorrhages, exudates or papilledema. Vision grossly normal. Neck:  No deformities, masses, or tenderness noted. Lungs:  Normal respiratory effort, chest expands symmetrically. Lungs are clear to auscultation, no crackles or wheezes. Heart:  Normal rate and regular rhythm. S1 and S2 normal without gallop, murmur, click, rub or other extra sounds. Pulses:  +2 carotid, radial, DP Neurologic:  No cranial nerve deficits noted. Station and gait are normal. Plantar reflexes are down-going bilaterally. DTRs are symmetrical throughout. Sensory, motor and coordinative functions appear intact. Psych:  Cognition and judgment appear intact. Alert and cooperative with normal attention span and concentration. No apparent delusions, illusions, hallucinations   Impression & Recommendations:  Problem # 1:  FACIAL PARESTHESIA, RIGHT (ICD-782.0) Assessment New pt's facial numbness for 4 days w/ possible facial drooping is concerning for TIA.  asymptomatic today.  has had (-) MRI and cervical spine xrays but has never had MRA.  sxs not consistent w/ Bell's Palsy as she did not have motor weakness/paralysis.  sxs sound consistent w/ trigeminal neuralgia but showed pt a diagram of trigeminal innervation and she reported that sxs were more extensive than this.  will check Cr and then proceed w/ MRA to evaluate possible cause of sxs.  pt's sxs resolved after taking  daily ASA 325mg - told her to continue this until work up complete.  no murmur heard today but will need to consider ECHO in the future.  encouraged pt that if sxs return she must go to ER for evaluation.  will proceed w/ neuro referral. Orders: Venipuncture ([id number]) Radiology Referral (Radiology) TLB-Creatinine, Blood ([id number]-[id number]) Neurology Referral (Neuro)  Complete Medication List: 1)  Singulair 10 Mg Tabs (Montelukast sodium) .... Twice  daily 2)  Proventil Hfa 108 (90 Base) Mcg/act Aers (Albuterol sulfate) .... As needed 3)  Effexor Xr 37.5 Mg Xr24h-cap (Venlafaxine hcl) .... 3 a day for 1 month then 2 a day for 1 month then 1 a day for 1 month 4)  Fexofenadine Hcl 180 Mg Tabs (Fexofenadine hcl) .... Take 1 by mouth once daily 5)  Tussionex Pennkinetic Er 8-10 Mg/13ml Lqcr (Chlorpheniramine-hydrocodone) .Marland Kitchen.. 1 tsp by mouth at bedtime as needed cough 6)  Nebulizer Compressor Misc (Nebulizers) .... Breathing treatment as needed 7)  Prednisone 5 Mg Tabs (Prednisone) .Marland KitchenMarland KitchenMarland Kitchen  Take 1 tablet by mouth once a day 8)  Nasonex 50 Mcg/act Susp (Mometasone furoate) .... 2 squirts each nostril daily 9)  Spiriva Handihaler 18 Mcg Caps (Tiotropium bromide monohydrate) .... One puff in the morning 10)  Asmanex 120 Metered Doses 220 Mcg/inh Aepb (Mometasone furoate) .... 2 puffs in the evening 11)  Prednisone 1 Mg Tabs (Prednisone) .... Take as directed 12)  Shaklee Osteomatrix -- Ca, Mg, Vita D  .... 2 by mouth two times a day 13)  Mega Red---omega 3  .Marland Kitchen.. 1 by mouth once daily 14)  Tessalon Perles 100 Mg Caps (Benzonatate) .Marland Kitchen.. 1 by mouth three times a day as needed 15)  Zocor 40 Mg Tabs (Simvastatin) .Marland Kitchen.. 1 by mouth at bedtime  Patient Instructions: 1)  We will call you with your neuro referral and your MRI info 2)  If you again develop numbness or any other concerns- please go to the ER 3)  Continue a daily 325mg  Aspirin 4)  Call with any questions or concerns 5)  Hang in there!   Orders Added: 1)   Venipuncture [[id number]] 2)  Radiology Referral [Radiology] 3)  TLB-Creatinine, Blood [82565-CREA] 4)  Neurology Referral [Neuro] 5)  Est. Patient Level IV [81191]

## 2010-07-22 NOTE — Medication Information (Signed)
Summary: Approval for Venlafaxine/[employer]  Approval for Venlafaxine/[employer]   Imported By: Lanelle Bal 07/05/2010 11:09:14  _____________________________________________________________________  External Attachment:    Type:   Image     Comment:   External Document

## 2010-07-22 NOTE — Progress Notes (Signed)
Summary: mri results-lmom  Phone Note Outgoing Call   Call placed by: Doristine Devoid CMA,  June 29, 2010 8:33 AM Call placed to: Patient Summary of Call: some atherosclerotic changes of the posterior circulation.  uncertain if this is causing pt's numbness.  will defer to neurology.    Follow-up for Phone Call        left message on machine ...Marland KitchenMarland KitchenDoristine Devoid CMA  June 29, 2010 8:33 AM   Additional Follow-up for Phone Call Additional follow up Details #1::        pt aware, copy  will be given to patient when she comes in for son's appt today.... Almeta Monas CMA (AAMA)  June 29, 2010 10:20 AM

## 2010-07-22 NOTE — Progress Notes (Signed)
Summary: MRA REFERRAL  ---- Converted from flag ---- ---- 06/17/2010 9:08 AM, Neena Rhymes MD wrote: the radiologist said w/ contrast- that's why we got the Creatinine  ---- 06/17/2010 8:53 AM, Magdalen Spatz [facility] wrote: Dr. Beverely Low,  For the MRA of the Carotids, will that be without or with contrast, or both?  Thanks ------------------------------

## 2010-07-22 NOTE — Progress Notes (Signed)
Summary: previsit letter ?'s  Phone Note Call from Patient Call back at Home Phone [phone]   Caller: Patient Call For: Dr. Juanda Chance Reason for Call: Talk to Nurse Summary of Call: previsit letter ?'s Initial call taken by: Vallarie Mare,  May 31, 2010 11:15 AM  Follow-up for Phone Call        Patient wanted to advise [name] before her colonoscopy scheduled on  06/29/10 that she has severe asthma and possible COPD, followed by Dr Colletta Maryland. She also has questionable sleep apnea. Advised patient that there should be no problem with sedation but that I would make Dr aware of her concerns. Follow-up by: Lamona Curl CMA Duncan Dull),  May 31, 2010 11:29 AM     Appended Document: previsit letter ?'s Please call the pt and give her a choice of seeing me before the colonoscopy in January so we can discuss her risks for the procedure  Appended Document: previsit letter ?'s Spoke with patient and added her for REV with Dr Juanda Chance on 06/22/10.

## 2010-07-22 NOTE — Progress Notes (Signed)
Summary:  still need MRA?  Phone Note Outgoing Call Call back at [facility] [phone]   Summary of Call: Pt schedule for MRA on tomorrow but wants to know if she should still have it because she has not experience the R sided facial numbness since OV on 06-16-10. Pt notes that she has been on a daily regimen of the ASA 325mg  since OV............Marland KitchenFelecia Deloach CMA  June 23, 2010 12:52 PM   Follow-up for Phone Call        would still proceed w/ MRA since she had 4 days of sxs and this has happened before.  continue ASA Follow-up by: Neena Rhymes MD,  June 23, 2010 12:59 PM  Additional Follow-up for Phone Call Additional follow up Details #1::        Discuss with patient...........Marland KitchenFelecia Deloach CMA  June 23, 2010 3:02 PM

## 2010-07-22 NOTE — Progress Notes (Signed)
Summary: Prior Auth APPROVED Effexor [employer]  Phone Note Refill Request   Refills Requested: Medication #1:  EFFEXOR XR 37.5 MG XR24H-CAP 3 a day for 1 month then 2 a day for 1 month then 1 a day for 1 month Formed received in mail from insurance. PA completed and faxed back awaiting response..........Marland KitchenFelecia Deloach CMA  June 25, 2010 4:10 PM    Follow-up for Phone Call        Prior Auth approved for 3 month, or until the [facility] eligibility on the policy ends. 06-29-10 until 10-18-10. Approval letter scan to chart............Marland KitchenFelecia Deloach CMA  June 29, 2010 10:54 AM

## 2010-07-22 NOTE — Progress Notes (Signed)
Summary: needs predisone  Phone Note Call from Patient Call back at Home Phone [phone]   Caller: Patient Call For: ramaswamy Summary of Call: pt has a cough and wheezing from a cold she got a few weeks ago thinks she needs predisone called to [facility] [address]  Initial call taken by: Lacinda Axon,  May 31, 2010 2:18 PM  Follow-up for Phone Call        Spoke with pt.  She is c/o increased SOB, wheezing and prod cough with brown sputum x 2 wks- worse x 5 days.  I offered ov for tommorrow and she refused and states that she does not think this should be neccessary.  Would like something called to pharm. Pls advise thanks! Allergies (verified):  1)  ! Sulfa 2)  ! * Flovent 3)  ! * Symbicort 4)  ! * Dulera 5)  ! * Qvar 6)  ! * Proventil Hfa Follow-up by: Vernie Murders,  May 31, 2010 4:08 PM  Additional Follow-up for Phone Call Additional follow up Details #1::        prednisone 40mg  by mouth daily x 2 days, 30mg  by mouth daily x 2 days, then 20mg  by mouth daily x 2 days, and then 10mg  by mouth x 2days and then to cntinue home dose of 5mg  per day Additional Follow-up by: Kalman Shan MD,  May 31, 2010 4:52 PM    Additional Follow-up for Phone Call Additional follow up Details #2::    Spoke with pt and advised okay to send in prednisone taper per MR. She needs more pred, so I sent refill to pharm with new directions. Follow-up by: Vernie Murders,  May 31, 2010 4:57 PM  New/Updated Medications: PREDNISONE 10 MG TABS (PREDNISONE) 4 x 2 days, 3 x 2 days, 2 x 2 days, 1 x 2 days, then stay at 5 mg daily Prescriptions: PREDNISONE 10 MG TABS (PREDNISONE) 4 x 2 days, 3 x 2 days, 2 x 2 days, 1 x 2 days, then stay at 5 mg daily  #100 x 0   Entered by:   Vernie Murders   Authorized by:   Kalman Shan MD   Signed by:   Vernie Murders on 05/31/2010   Method used:   Electronically to        [facility]  [id number] ([facility])       [address]  [place], [place]  [address]       Ph: [phone] or [phone]       Fax: [phone]   RxID:   [id number]

## 2010-07-30 ENCOUNTER — Telehealth (INDEPENDENT_AMBULATORY_CARE_PROVIDER_SITE_OTHER): Payer: Self-pay | Admitting: *Deleted

## 2010-07-30 ENCOUNTER — Telehealth: Payer: Self-pay | Admitting: Family Medicine

## 2010-08-04 ENCOUNTER — Encounter (INDEPENDENT_AMBULATORY_CARE_PROVIDER_SITE_OTHER): Payer: Self-pay | Admitting: *Deleted

## 2010-08-04 ENCOUNTER — Ambulatory Visit (INDEPENDENT_AMBULATORY_CARE_PROVIDER_SITE_OTHER): Payer: Managed Care, Other (non HMO) | Admitting: Internal Medicine

## 2010-08-04 ENCOUNTER — Other Ambulatory Visit: Payer: Self-pay | Admitting: Internal Medicine

## 2010-08-04 ENCOUNTER — Encounter: Payer: Self-pay | Admitting: Internal Medicine

## 2010-08-04 ENCOUNTER — Other Ambulatory Visit: Payer: Managed Care, Other (non HMO)

## 2010-08-04 DIAGNOSIS — K625 Hemorrhage of anus and rectum: Secondary | ICD-10-CM

## 2010-08-04 DIAGNOSIS — K7689 Other specified diseases of liver: Secondary | ICD-10-CM

## 2010-08-04 LAB — HEPATIC FUNCTION PANEL
Albumin: 3.9 g/dL (ref 3.5–5.2)
Alkaline Phosphatase: 81 U/L (ref 39–117)
Bilirubin, Direct: 0 mg/dL (ref 0.0–0.3)

## 2010-08-05 NOTE — Progress Notes (Signed)
  Phone Note Outgoing Call   Call placed by: Jesse Fall, RN Call placed to: Patient Summary of Call: Patient reporting that for the last 3 days, she has had bright, red blood with bowel movements. Blood on stool and when she wipes. Denies any abdominal pain. Stools are soft. No change in bowel habits. States she does not have a history of hemorrhoids. Scheduled patient on 08/04/10 at 3 PM with Dr. Juanda Chance Initial call taken by: Jesse Fall RN,  July 30, 2010 1:39 PM  Follow-up for Phone Call        OK Follow-up by: Hart Carwin MD,  July 30, 2010 10:34 PM

## 2010-08-05 NOTE — Progress Notes (Signed)
Summary: Refill Request  Phone Note Refill Request Call back at [phone] Message from:  Pharmacy on July 30, 2010 9:34 AM  Refills Requested: Medication #1:  EFFEXOR XR 37.5 MG XR24H-CAP 3 a day for 1 month then 2 a day for 1 month then 1 a day for 1 month   Dosage confirmed as above?Dosage Confirmed   Supply Requested: 3 months   Last Refilled: 05/27/2010 [facility]  Next Appointment Scheduled: none Initial call taken by: Harold Barban,  July 30, 2010 9:34 AM    Prescriptions: EFFEXOR XR 37.5 MG XR24H-CAP (VENLAFAXINE HCL) 3 a day for 1 month then 2 a day for 1 month then 1 a day for 1 month  #270 x 0   Entered by:   Doristine Devoid CMA   Authorized by:   [name]   Signed by:   Doristine Devoid CMA on 07/30/2010   Method used:   Faxed to ...       [address] [facility] (mail-order)       [address]       [place], [place]  [address]       Ph: [phone]       Fax: [phone]   RxID:   [id number]

## 2010-08-05 NOTE — Progress Notes (Signed)
Summary: Questions about meds   Phone Note Call from Patient   Caller: Patient Call For: [name] Summary of Call: spoke with Victoria [name] and she needed refill on Effexor 37.5 stated she had been taking it 3 times a day, wants to get the 75mg  and see how she does on it but wants to know what u think? Pt states she has alot going on and not sure what to take.  Stated she has had bright red blood when wiping after making a BM x3days, I advised she needs and office visit wit GI, if she can not get an appt soon she will call us back for an Eval, no Hx of hemorrhoids... Rx goes to [place] [facility]. C/B# [phone] please advise on the Effexor....  Initial call taken by: Almeta Monas CMA Duncan Dull),  July 30, 2010 11:47 AM  Follow-up for Phone Call        she should either stay on 3 a day or go up to 150 Follow-up by: [name],  July 30, 2010 12:18 PM  Additional Follow-up for Phone Call Additional follow up Details #1::        I spoke w/ pt she states she is going to stay on 3 a day. Army Fossa CMA  July 30, 2010 1:56 PM     New/Updated Medications: EFFEXOR XR 37.5 MG XR24H-CAP (VENLAFAXINE HCL) 3 by mouth daily. Prescriptions: EFFEXOR XR 37.5 MG XR24H-CAP (VENLAFAXINE HCL) 3 a day for 1 month then 2 a day for 1 month then 1 a day for 1 month  #270 x 0   Entered by:   Army Fossa CMA   Authorized by:   [name]   Signed by:   Army Fossa CMA on 07/30/2010   Method used:   Faxed to ...       [facility] (mail-order)       [address]       [place], [place]  [address]       Ph: [phone]       Fax: [phone]   RxID:   [id number] EFFEXOR XR 37.5 MG XR24H-CAP (VENLAFAXINE HCL) 3 a day for 1 month then 2 a day for 1 month then 1 a day for 1 month  #180 x 0   Entered by:   Army Fossa CMA   Authorized by:   [name]   Signed by:   Army Fossa CMA on 07/30/2010   Method used:   Faxed to ...       [address] [facility] (mail-order)       [address]       [place], [place]  [address]       Ph: [phone]       Fax: [phone]   RxID:   [id number]  pt needed to rx- changed to 3 a day. Army Fossa CMA  July 30, 2010 1:58 PM

## 2010-08-10 ENCOUNTER — Telehealth: Payer: Self-pay | Admitting: Internal Medicine

## 2010-08-11 NOTE — Letter (Signed)
Summary: [facility] Instructions  [facility]  [address] [place], [place] [address]   Phone: [phone]  Fax: [phone]       Victoria [name]    [birth year]    MRN: [id number]        Procedure Day /Date: Thursday 08/12/10     Arrival Time: 2:00 pm     Procedure Time: 3:00 pm     Location of Procedure:                    _x _  [facility] (4th Floor)  PREPARATION FOR COLONOSCOPY WITH MOVIPREP   Starting 5 days prior to your procedure 08/07/10 do not eat nuts, seeds, popcorn, corn, beans, peas,  salads, or any raw vegetables.  Do not take any fiber supplements (e.g. Metamucil, Citrucel, and Benefiber).  THE DAY BEFORE YOUR PROCEDURE         DATE: 08/11/10  DAY: Wednesday  1.  Drink clear liquids the entire day-NO SOLID FOOD  2.  Do not drink anything colored red or purple.  Avoid juices with pulp.  No orange juice.  3.  Drink at least 64 oz. (8 glasses) of fluid/clear liquids during the day to prevent dehydration and help the prep work efficiently.  CLEAR LIQUIDS INCLUDE: Water Jello Ice Popsicles Tea (sugar ok, no milk/cream) Powdered fruit flavored drinks Coffee (sugar ok, no milk/cream) Gatorade Juice: apple, white grape, white cranberry  Lemonade Clear bullion, consomm, broth Carbonated beverages (any kind) Strained chicken noodle soup Hard Candy                             4.  In the morning, mix first dose of MoviPrep solution:    Empty 1 Pouch A and 1 Pouch B into the disposable container    Add lukewarm drinking water to the top line of the container. Mix to dissolve    Refrigerate (mixed solution should be used within 24 hrs)  5.  Begin drinking the prep at 5:00 p.m. The MoviPrep container is divided by 4 marks.   Every 15 minutes drink the solution down to the next mark (approximately 8 oz) until the full liter is complete.   6.  Follow completed prep with 16 oz of clear liquid of your choice (Nothing red or purple).   Continue to drink clear liquids until bedtime.  7.  Before going to bed, mix second dose of MoviPrep solution:    Empty 1 Pouch A and 1 Pouch B into the disposable container    Add lukewarm drinking water to the top line of the container. Mix to dissolve    Refrigerate  THE DAY OF YOUR PROCEDURE      DATE: 08/12/10 DAY: Thursday  Beginning at 10:00 a.m. (5 hours before procedure):         1. Every 15 minutes, drink the solution down to the next mark (approx 8 oz) until the full liter is complete.  2. Follow completed prep with 16 oz. of clear liquid of your choice.    3. You may drink clear liquids until 1:00 pm (2 HOURS BEFORE PROCEDURE).   MEDICATION INSTRUCTIONS  Unless otherwise instructed, you should take regular prescription medications with a small sip of water   as early as possible the morning of your procedure.        OTHER INSTRUCTIONS  You will need a responsible adult at least 54 years  of age to accompany you and drive you home.   This person must remain in the waiting room during your procedure.  Wear loose fitting clothing that is easily removed.  Leave jewelry and other valuables at home.  However, you may wish to bring a book to read or  an iPod/MP3 player to listen to music as you wait for your procedure to start.  Remove all body piercing jewelry and leave at home.  Total time from sign-in until discharge is approximately 2-3 hours.  You should go home directly after your procedure and rest.  You can resume normal activities the  day after your procedure.  The day of your procedure you should not:   Drive   Make legal decisions   Operate machinery   Drink alcohol   Return to work  You will receive specific instructions about eating, activities and medications before you leave.    The above instructions have been reviewed and explained to me by   _______________________    I fully understand and can verbalize these instructions  _____________________________ Date _________

## 2010-08-11 NOTE — Assessment & Plan Note (Signed)
Summary: Rectal bleeding, no show, copay/[place]   History of Present Illness Visit Type: Initial Consult Primary GI MD: Lina Sar MD Primary Provider: [name] Requesting Provider: [name] Chief Complaint: rectal bleeding last week  History of Present Illness:   55 y.o, WF with an episode of painless rectal bleeding, which has resolved. She has regular bowl habits and no F hx of colon cancer. She has a hx of abnormal LFT's likely related to statins which have been discontinued. She has never had a colonoscopy although she was scheduled for it on 2 separate ocasions. She has had concerns regarging her asthmatic bronchitis and risk of sedation.   GI Review of Systems    Reports acid reflux, belching, and  bloating.      Denies abdominal pain, chest pain, dysphagia with liquids, dysphagia with solids, heartburn, loss of appetite, nausea, vomiting, vomiting blood, weight loss, and  weight gain.        Denies anal fissure, black tarry stools, change in bowel habit, constipation, diarrhea, diverticulosis, fecal incontinence, heme positive stool, hemorrhoids, irritable bowel syndrome, jaundice, light color stool, liver problems, rectal bleeding, and  rectal pain. Preventive Screening-Counseling & Management      Drug Use:  no.      Current Medications (verified): 1)  Singulair 10 Mg  Tabs (Montelukast Sodium) .... Twice  Daily 2)  Proventil Hfa 108 (90 Base) Mcg/act Aers (Albuterol Sulfate) .... As Needed 3)  Effexor Xr 37.5 Mg Xr24h-Cap (Venlafaxine Hcl) .... 3 By Mouth Daily. 4)  Tussionex Pennkinetic Er 8-10 Mg/20ml Lqcr (Chlorpheniramine-Hydrocodone) .Marland Kitchen.. 1 Tsp By Mouth At Bedtime As Needed Cough 5)  Nebulizer Compressor  Misc (Nebulizers) .... Breathing Treatment As Needed 6)  Prednisone 5 Mg Tabs (Prednisone) .... Take 1 Tablet By Mouth Once A Day 7)  Nasonex 50 Mcg/act Susp (Mometasone Furoate) .... 2 Squirts Each Nostril Daily 8)  Spiriva Handihaler 18 Mcg Caps  (Tiotropium Bromide Monohydrate) .... One Puff in The Morning 9)  Asmanex 120 Metered Doses 220 Mcg/inh Aepb (Mometasone Furoate) .... 2 Puffs in The Evening 10)  Prednisone 1 Mg Tabs (Prednisone) .... Take As Directed 11)  Shaklee Osteomatrix -- Ca, Mg, Vita D .... 2 By Mouth Two Times A Day 12)  Mega Red---Omega 3 .Marland Kitchen.. 1 By Mouth Once Daily 13)  Tessalon Perles 100 Mg Caps (Benzonatate) .Marland Kitchen.. 1 By Mouth Three Times A Day As Needed 14)  Zocor 40 Mg Tabs (Simvastatin) .Marland Kitchen.. 1 By Mouth At Bedtime 15)  Shaklee Immunity .Marland Kitchen.. 1 By Mouth Once Daily 16)  Shaklee Carotomax .Marland KitchenMarland Kitchen. 1 By Mouth Once Daily 17)  Vitamin D 2000 Unit Tabs (Cholecalciferol) .Marland Kitchen.. 1 By Mouth Once Daily  Allergies (verified): 1)  ! Sulfa 2)  ! * Flovent 3)  ! * Symbicort 4)  ! * Dulera 5)  ! * Qvar 6)  ! * Proventil Hfa  Past History:  Past Medical History: # ABPA x 12/14/2007 .Marland KitchenMarland KitchenMarland KitchenRamaswamy  # ASTHMA x age 8 months. ....Marland KitchenMarland KitchenRamaswamy  # Obesity - BMI 30 > "secondary to flovent in 1996"  #) Maxillary Sinusitis 2007.Marland KitchenMarland KitchenDr Jearld Fenton    - 09/2008 - re-referred to ENT  # Depression  # Osteoarthritis / Osteopenia  - on fosmax   # IBS  # Hyperlipidemia  # Mod OSA by hx in 2007.  AHI 22.5 02/2606  Refused CPAP and sleep re-referral in 11/2007  # Pulmonary nodules - small stable nodules:2006 & summer 2007 ct chest. Not present at [facility] 11/2007.   #  Hx of recurent Hives since highschool   - resolved 04/2008 - 10/2008 while on itracconazole for abpa    -recurred 6/10 while off itraconazole but attributed to vit d and flax seed on visit 12/2008  - Jan 2011 - attributed to qvar  - feb 2011 - hx that in past symbicort, advair, dulera all cause hives  - Sept 2011 - hx of hives with ventolin HFA but not with pro-air  COPD  Past Surgical History: Reviewed history from 11/25/2008 and no changes required. Appendectomy Caesarean section Hx of normal stress test 10/2008  Family History: Reviewed history from 06/11/2010 and no  changes required. Family History Diabetes 1st degree relative Family History Other cancer-Breast-Mother Family History of Prostate Cancer:Father Diverticulosis-father No FH of Colon Cancer:  Social History: Reviewed history from 11/16/2007 and no changes required. Never Smoked Alcohol use-yes Drug use-no Married 1 son with cerebral palsy. Lot of aides at home for son Illicit Drug Use - no  Review of Systems       The patient complains of allergy/sinus, shortness of breath, and urine leakage.  The patient denies anemia, anxiety-new, arthritis/joint pain, back pain, blood in urine, breast changes/lumps, change in vision, confusion, cough, coughing up blood, depression-new, fainting, fatigue, fever, headaches-new, hearing problems, heart murmur, heart rhythm changes, itching, menstrual pain, muscle pains/cramps, night sweats, nosebleeds, pregnancy symptoms, skin rash, sleeping problems, sore throat, swelling of feet/legs, swollen lymph glands, thirst - excessive , urination - excessive , urination changes/pain, vision changes, and voice change.         Pertinent positive and negative review of systems were noted in the above HPI. All other ROS was otherwise negative.   Vital Signs:  Patient profile:   55 year old female Height:      64 inches Weight:      173 pounds BMI:     29.80 Pulse rate:   64 / minute Pulse rhythm:   regular BP sitting:   108 / 78  (left arm)  Vitals Entered By: Milford Cage NCMA (August 04, 2010 3:57 PM)  Physical Exam  General:  cushinoid Eyes:  PERRLA, no icterus. Mouth:  No deformity or lesions, dentition normal. Neck:  Supple; no masses or thyromegaly. Lungs:  no wheezes or rales Heart:  Regular rate and rhythm; no murmurs, rubs,  or bruits. Abdomen:  protuberant, soft, positive bowl sounds, no mass,  Rectal:  rectal and anoscopic exam shows small 1st grade hemorrhoids, no fissure, normal appearing mucosa of the ampulla, stool was heme  negative Extremities:  No clubbing, cyanosis, edema or deformities noted. Skin:  Intact without significant lesions or rashes. Psych:  Alert and cooperative. Normal mood and affect.   Impression & Recommendations:  Problem # 1:  RECTAL BLEEDING (ICD-569.3)  likely anorectal origin,, but no definite source see on today's anoscopic exam. Plan to proceed with colonoscopy  Orders: Colonoscopy (Colon) TLB-Hepatic/Liver Function Pnl (80076-HEPATIC)  Problem # 2:  DIVERTICULOSIS, COLON (ICD-562.10) encourage high fiber dier  Patient Instructions: 1)  Your physician requests that you go to the basement floor of our office to have the following labwork completed before leaving today: Hepatic Panel 2)  You have been scheduled for a colonoscopy. Please follow written prep instructions that were given to you today at your visit.  3)  Please pick up your prescription for Moviprep at the pharmacy. An electronic presription has already been sent. 4)  The medication list was reviewed and reconciled.  All changed / newly prescribed medications were  explained.  A complete medication list was provided to the patient / caregiver. Prescriptions: MOVIPREP 100 GM  SOLR (PEG-KCL-NACL-NASULF-NA ASC-C) As per prep instructions.  #1 x 0   Entered by:   Lamona Curl CMA (AAMA)   Authorized by:   Hart Carwin MD   Signed by:   Lamona Curl CMA (AAMA) on 08/04/2010   Method used:   [facility]ronically to        [facility]  [address] [facility]       [address]       [place], [place]  [address]       Ph: [phone] or [phone]       Fax: [phone]   RxID:   [id number]

## 2010-08-12 ENCOUNTER — Encounter: Payer: Self-pay | Admitting: Internal Medicine

## 2010-08-12 ENCOUNTER — Telehealth (INDEPENDENT_AMBULATORY_CARE_PROVIDER_SITE_OTHER): Payer: Self-pay | Admitting: *Deleted

## 2010-08-12 ENCOUNTER — Other Ambulatory Visit (AMBULATORY_SURGERY_CENTER): Payer: Managed Care, Other (non HMO) | Admitting: Internal Medicine

## 2010-08-12 DIAGNOSIS — K5730 Diverticulosis of large intestine without perforation or abscess without bleeding: Secondary | ICD-10-CM

## 2010-08-12 DIAGNOSIS — K921 Melena: Secondary | ICD-10-CM

## 2010-08-17 NOTE — Progress Notes (Signed)
Summary: Colon 2-23  Phone Note Call from Patient Call back at Home Phone [phone]   Call For: Dr Juanda Chance Summary of Call: Colon thursday 2-23. Son has celebral parsy. Is exhausted & wiped out due to her son's illnes - he is not doing well.  Wonders if this is too much to handle at this time. Initial call taken by: Leanor Kail [facility],  August 10, 2010 1:47 PM  Follow-up for Phone Call        Spoke with patient and she decided to come and get her colonoscopy. Follow-up by: Jesse Fall RN,  August 10, 2010 2:17 PM

## 2010-08-17 NOTE — Miscellaneous (Signed)
Summary: med  Clinical Lists Changes  Medications: Added new medication of ANALPRAM-HC 1-2.5 % CREA (HYDROCORTISONE ACE-PRAMOXINE) Apply to rectal area twice daily as needed if rectal bleeding is present - Signed Rx of ANALPRAM-HC 1-2.5 % CREA (HYDROCORTISONE ACE-PRAMOXINE) Apply to rectal area twice daily as needed if rectal bleeding is present;  #15gms x 1;  Signed;  Entered by: Clide Cliff RN;  Authorized by: Hart Carwin MD;  Method used: [facility]  [address] [id number] [address], [place], [place]  [address], Ph: [phone] or [phone], Fax: [phone] Observations: Added new observation of ALLERGY REV: Done (08/12/2010 15:56)    Prescriptions: ANALPRAM-HC 1-2.5 % CREA (HYDROCORTISONE ACE-PRAMOXINE) Apply to rectal area twice daily as needed if rectal bleeding is present  #15gms x 1   Entered by:   Clide Cliff RN   Authorized by:   Hart Carwin MD   Signed by:   Clide Cliff RN on 08/12/2010   Method used:   [facility]  [address] [facility]       [address]       [place], [place]  [address]       Ph: [phone] or [phone]       Fax: [phone]   RxID:   [id number]

## 2010-08-17 NOTE — Progress Notes (Signed)
Summary: Results 2/23  Phone Note Outgoing Call   Call placed by: Almeta Monas CMA Duncan Dull),  August 12, 2010 5:16 PM Call placed to: Patient Details for Reason: Labs are Stable per Dr. Laury Axon Summary of Call: Left message to call back... Initial call taken by: Almeta Monas CMA Duncan Dull),  August 12, 2010 5:17 PM  Follow-up for Phone Call        pt aware of results.... she stated she will have home health nurse come out and check on him today.... Almeta Monas CMA Duncan Dull)  August 13, 2010 9:27 AM      Appended Document: Results 2/23 [name]'s labs results

## 2010-08-17 NOTE — Procedures (Signed)
Summary: Colonoscopy  Patient: [name] Note: All result statuses are Final unless otherwise noted.  Tests: (1) Colonoscopy (COL)   COL Colonoscopy           DONE     [facility]     [address]     [place], [place]  [address]           COLONOSCOPY PROCEDURE REPORT           PATIENT:  Victoria, [name]  MR#:  [id number]     BIRTHDATE:  11/04/1955, 54 yrs. old  GENDER:  female     ENDOSCOPIST:  Hedwig Morton. Juanda Chance, MD     REF. BY:  [name], DO     PROCEDURE DATE:  08/12/2010     PROCEDURE:  Colonoscopy [address]     ASA CLASS:  Class I     INDICATIONS:  hematochezia, colorectal cancer screening, average     risk     MEDICATIONS:   Versed 6 mg, Fentanyl 50 mcg           DESCRIPTION OF PROCEDURE:   After the risks benefits and     alternatives of the procedure were thoroughly explained, informed     consent was obtained.  Digital rectal exam was performed and     revealed no rectal masses.   The LB [id number] endoscope     was introduced through the anus and advanced to the cecum, which     was identified by both the appendix and ileocecal valve, without     limitations.  The quality of the prep was good, using MoviPrep.     The instrument was then slowly withdrawn as the colon was fully     examined.     <<PROCEDUREIMAGES>>           FINDINGS:  Mild diverticulosis was found in the sigmoid colon (see     image1, image2, image3, and image8).  This was otherwise a normal     examination of the colon (see image9, image5, and image4).     Retroflexed views in the rectum revealed no abnormalities.    The     scope was then withdrawn from the patient and the procedure     completed.           COMPLICATIONS:  None     ENDOSCOPIC IMPRESSION:     1) Mild diverticulosis in the sigmoid colon     2) Otherwise normal examination     nothing to account for hematochezia, suspect anorectal source of     bleeding     RECOMMENDATIONS:     Analpram cream 2.5%,15 gm, apply bid  prn rectal bleeding     REPEAT EXAM:  In 10 year(s) for.           ______________________________     Hedwig Morton. Juanda Chance, MD           CC:           n.     eSIGNED:   Hedwig Morton. Brodie at 08/12/2010 03:36 PM           [name], [phone]  Note: An exclamation mark (!) indicates a result that was not dispersed into the flowsheet. Document Creation Date: 08/12/2010 3:36 PM _______________________________________________________________________  (1) Order result status: Final Collection or observation date-time: 08/12/2010 15:30 Requested date-time:  Receipt date-time:  Reported date-time:  Referring Physician:   Ordering Physician: Lina Sar [phone]) Specimen Source:  Source: [name] Order Number: [id number] Lab site:   Appended Document: Colonoscopy    Clinical Lists Changes  Observations: Added new observation of COLONNXTDUE: 07/2020 (08/12/2010 16:18)

## 2010-09-07 ENCOUNTER — Ambulatory Visit: Payer: Managed Care, Other (non HMO) | Admitting: Family Medicine

## 2010-09-13 ENCOUNTER — Encounter: Payer: Self-pay | Admitting: Internal Medicine

## 2010-09-13 ENCOUNTER — Ambulatory Visit (INDEPENDENT_AMBULATORY_CARE_PROVIDER_SITE_OTHER): Payer: Managed Care, Other (non HMO) | Admitting: Internal Medicine

## 2010-09-13 DIAGNOSIS — R030 Elevated blood-pressure reading, without diagnosis of hypertension: Secondary | ICD-10-CM

## 2010-09-13 DIAGNOSIS — F411 Generalized anxiety disorder: Secondary | ICD-10-CM

## 2010-09-13 MED ORDER — LORAZEPAM 0.5 MG PO TABS: 0.5000 mg | ORAL_TABLET | Freq: Three times a day (TID) | ORAL | Status: AC

## 2010-09-13 MED ORDER — VENLAFAXINE HCL ER 150 MG PO CP24: 150.0000 mg | ORAL_CAPSULE | Freq: Every day | ORAL | Status: DC

## 2010-09-13 NOTE — Patient Instructions (Addendum)
Continue to monitor blood pressure; ideally your average should be less than 135/85. Minimal goal would be less than 140/90 on average. If he feels which are under stress or when exposed to stress he uses the lorazepam every 8 hours as needed. It should not be taken on a routine basis as it could be habit forming. Doreatha Martin out your present supply venlafaxine 37.5 mg at a dose of 4 pills daily. When those are completed start 150 mg pill one daily

## 2010-09-13 NOTE — Progress Notes (Signed)
  Subjective:    Patient ID: Victoria [name], female    DOB: 1956-02-03, 55 y.o.   MRN: [id number]  HPI   She presents for follow up of intermittent hypertensive spikes.   On 3/22/ 2012  she was talking long-distance with a nursing facility in  [place] about her father's health;  she developed a bandlike squeezing headache ; palpitations; and tachycardia while on the phone.  She treats these symptoms by going to bed, using an ice pack , &  "total rest "   Yesterday March 25 she had a recurrence of the symptoms at church while discussing her father's health situation withfellow parishioners.  She checked  her blood pressure when she returned from church and it was 135/96.  The initial episode occurred in January 2011 when she was speaking probably at her church.   She denies anxiety, depression, or mood swings. She describes herself as "even keeled " on Effexor. She  Does admit that she's been under a great of stress in the context of her sons health issues. Specifically he has had spinal surgery for complications of cerebral palsy.  Additionally her sister who lives close to her father in [place] has not been active in his care and has been somewhat demonstrating of her involvement with their father's care.   She has been on Effexor at least partially because of long-term steroids required for her asthma. Both her sister and her father are on lorazepam.   Review of Systems     Objective:   Physical Exam   On exam she's in no acute distress. Mild Cushings facial changes are suggested  Fundal exam reveals mild arteriolar narrowing but no malignant hypertensive changes   chest is clear to auscultation despite her asthma history. Reveals mild expiratory wheezing  She is a regular rhythm without murmur or gallops.   pulses are intact without bruits.    there is no aortic aneurysm.  No edema is present. No clubbing or cyanosis is present.    she is oriented x3; her mood and affect at this time  are normal with no suggestion of anxiety or depression clinically           Assessment & Plan:    #1 hypertension driven by emotional stress without sustained elevation    Plan : #1 she should continue regular monitoring for blood pressure; goals an average of less than 135/85. Low-dose lorazepam on an as needed basis what appeared to be beneficial in view of the triggers for her hypertension.

## 2010-09-14 ENCOUNTER — Telehealth: Payer: Self-pay | Admitting: *Deleted

## 2010-09-14 NOTE — Telephone Encounter (Signed)
Spoke w/ pt aware of information and will continue to use netti pot and says she already has nasal spray at home.

## 2010-09-14 NOTE — Telephone Encounter (Signed)
Signs of sinusitis would be fever, facial pain and purulent secretions. Antibiotics would not be indicated in the absence of these.If she is having nasal congestion and appropriate maneuver would be using an Neti pot 2 times a day. We can also prescribe nasal spray if needed.

## 2010-09-16 ENCOUNTER — Other Ambulatory Visit: Payer: Self-pay | Admitting: *Deleted

## 2010-09-16 DIAGNOSIS — J45909 Unspecified asthma, uncomplicated: Secondary | ICD-10-CM

## 2010-09-16 MED ORDER — PREDNISONE 5 MG PO TABS: ORAL_TABLET | ORAL | Status: DC

## 2010-09-16 MED ORDER — ALBUTEROL SULFATE HFA 108 (90 BASE) MCG/ACT IN AERS: 2.0000 | INHALATION_SPRAY | Freq: Four times a day (QID) | RESPIRATORY_TRACT | Status: DC | PRN

## 2010-09-16 NOTE — Progress Notes (Signed)
RX called to [facility] @ [phone].

## 2010-09-20 ENCOUNTER — Other Ambulatory Visit (INDEPENDENT_AMBULATORY_CARE_PROVIDER_SITE_OTHER): Payer: Managed Care, Other (non HMO)

## 2010-09-20 DIAGNOSIS — E785 Hyperlipidemia, unspecified: Secondary | ICD-10-CM

## 2010-09-20 LAB — BASIC METABOLIC PANEL
BUN: 17 mg/dL (ref 6–23)
CO2: 29 mEq/L (ref 19–32)
Chloride: 100 mEq/L (ref 96–112)
Creatinine, Ser: 0.7 mg/dL (ref 0.4–1.2)

## 2010-09-21 ENCOUNTER — Encounter: Payer: Self-pay | Admitting: *Deleted

## 2010-09-28 LAB — POCT CARDIAC MARKERS
CKMB, poc: <1 ng/mL — ABNORMAL LOW (ref 1.0–8.0)
Troponin i, poc: <0.05 ng/mL (ref 0.00–0.09)

## 2010-09-28 LAB — CARDIAC PANEL(CRET KIN+CKTOT+MB+TROPI)
Relative Index: INVALID (ref 0.0–2.5)
Total CK: 45 U/L (ref 7–177)
Troponin I: 0.02 ng/mL (ref 0.00–0.06)

## 2010-09-28 LAB — CBC
MCHC: 33.8 g/dL (ref 30.0–36.0)
MCV: 91.2 fL (ref 78.0–100.0)
RBC: 4.32 MIL/uL (ref 3.87–5.11)

## 2010-09-28 LAB — HEMOGLOBIN A1C
Hgb A1c MFr Bld: 5.7 % (ref 4.6–6.1)
Mean Plasma Glucose: 117 mg/dL

## 2010-09-28 LAB — CK TOTAL AND CKMB (NOT AT ARMC)
CK, MB: 1.9 ng/mL (ref 0.3–4.0)
Relative Index: INVALID (ref 0.0–2.5)
Total CK: 56 U/L (ref 7–177)

## 2010-09-28 LAB — POCT I-STAT, CHEM 8
Calcium, Ion: 0.87 mmol/L — ABNORMAL LOW (ref 1.12–1.32)
Glucose, Bld: 100 mg/dL — ABNORMAL HIGH (ref 70–99)
HCT: 41 % (ref 36.0–46.0)
Hemoglobin: 13.9 g/dL (ref 12.0–15.0)
Potassium: 3.3 mEq/L — ABNORMAL LOW (ref 3.5–5.1)
TCO2: 21 mmol/L (ref 0–100)

## 2010-09-28 LAB — DIFFERENTIAL
Basophils Relative: 1 % (ref 0–1)
Eosinophils Absolute: 0.3 10*3/uL (ref 0.0–0.7)
Eosinophils Relative: 4 % (ref 0–5)
Monocytes Relative: 6 % (ref 3–12)
Neutrophils Relative %: 52 % (ref 43–77)

## 2010-09-28 LAB — LIPID PANEL: VLDL: 29 mg/dL (ref 0–40)

## 2010-09-30 ENCOUNTER — Ambulatory Visit: Payer: Managed Care, Other (non HMO) | Admitting: Internal Medicine

## 2010-09-30 ENCOUNTER — Telehealth: Payer: Self-pay | Admitting: Internal Medicine

## 2010-09-30 NOTE — Telephone Encounter (Signed)
Pt last seen 04/2010. She agrees to be seen today by MW at 3:15 pm.

## 2010-10-05 LAB — POCT I-STAT, CHEM 8
BUN: 11 mg/dL (ref 6–23)
Creatinine, Ser: 1 mg/dL (ref 0.4–1.2)
Hemoglobin: 13.9 g/dL (ref 12.0–15.0)
Potassium: 3.6 mEq/L (ref 3.5–5.1)
Sodium: 139 mEq/L (ref 135–145)

## 2010-10-08 ENCOUNTER — Ambulatory Visit: Payer: Managed Care, Other (non HMO) | Admitting: Family Medicine

## 2010-10-11 ENCOUNTER — Ambulatory Visit: Payer: Managed Care, Other (non HMO) | Admitting: Family Medicine

## 2010-10-13 ENCOUNTER — Ambulatory Visit: Payer: Managed Care, Other (non HMO) | Admitting: Family Medicine

## 2010-10-15 ENCOUNTER — Other Ambulatory Visit: Payer: Self-pay

## 2010-10-15 DIAGNOSIS — F411 Generalized anxiety disorder: Secondary | ICD-10-CM

## 2010-10-15 NOTE — Telephone Encounter (Signed)
Refill request Rcv'd ---looks like and Rx for Effexor 150 was written on 09/13/10 with 1 refill -- Please advise whether to fill this one or the requested 37.5   KP

## 2010-10-17 MED ORDER — VENLAFAXINE HCL 37.5 MG PO TABS: ORAL_TABLET | ORAL | Status: DC

## 2010-10-21 ENCOUNTER — Other Ambulatory Visit: Payer: Self-pay

## 2010-10-21 MED ORDER — VENLAFAXINE HCL 37.5 MG PO TABS: ORAL_TABLET | ORAL | Status: DC

## 2010-10-21 NOTE — Telephone Encounter (Signed)
Rcv'd letter from [facility] stating that Rx Effexor XR was not rcv'd----  Refaxed     KP

## 2010-10-22 ENCOUNTER — Telehealth: Payer: Self-pay | Admitting: Internal Medicine

## 2010-10-22 MED ORDER — TIOTROPIUM BROMIDE MONOHYDRATE 18 MCG IN CAPS: ORAL_CAPSULE | RESPIRATORY_TRACT | Status: DC

## 2010-10-22 MED ORDER — MOMETASONE FUROATE 220 MCG/INH IN AEPB: INHALATION_SPRAY | RESPIRATORY_TRACT | Status: DC

## 2010-10-22 NOTE — Telephone Encounter (Signed)
Both rxs were sent electronically this am

## 2010-10-22 NOTE — Telephone Encounter (Signed)
RX's has been sent to pharmacy.

## 2010-10-25 ENCOUNTER — Telehealth: Payer: Self-pay | Admitting: Internal Medicine

## 2010-10-25 NOTE — Telephone Encounter (Signed)
ATC x1. On hold for 5 minutes. WCB later.

## 2010-10-26 NOTE — Telephone Encounter (Signed)
[facility] given verbal okay for refill on pt's Spiriva. RX done on 5/4 by Verlon Au.

## 2010-11-02 NOTE — Assessment & Plan Note (Signed)
[facility]                                 ON-CALL NOTE   ARIONA, DESCHENE                          MRN:          [id number]  DATE:05/13/2007                            DOB:          1955/12/26    Patient of Dr. Laury Axon and Dr. Blossom Hoops at [phone].  The patient calls  as she is under treatment for a staph infection on her arm and ankle  with Levaquin for 3 days.  She has no associated fever but has some  increasing redness and some tingling burning feeling of her arm into her  chest area without obvious rash.  She apparently has had some problems  with skin infections before, I believe.  She does not see streaking.  Wonders what she should do.  There apparently was no culture done on the  skin infection.  I have recommended that she observe over the next 4-6  hours.  If she gets a fever, streaking no matter how faint up her arm or  increasing size of the infected area, then she should seek care in the  emergency room.  Otherwise, she should be seen tomorrow instead of her  appointment on Tuesday.     Neta Mends. Panosh, MD  Electronically Signed    WKP/MedQ  DD: 05/13/2007  DT: 05/13/2007  Job #: [id number]

## 2010-11-02 NOTE — Discharge Summary (Signed)
Victoria [name], Victoria [name]                 ACCOUNT NO.:  [id number]   MEDICAL RECORD NO.:  [id number]          PATIENT TYPE:  INP   LOCATION:  3730                         FACILITY:  [facility]   PHYSICIAN:  Sanda Linger, MD       DATE OF BIRTH:  18-Feb-1956   DATE OF ADMISSION:  10/21/2008  DATE OF DISCHARGE:  10/22/2008                               DISCHARGE SUMMARY   DISCHARGE DIAGNOSES:  1. Atypical chest pain with negative cardiac enzymes.  2. History of gastroesophageal reflux disease recently exacerbated by      steroid dosage.  3. Asthma, which is well controlled.  4. Mild hyperlipidemia.  5. Hypokalemia secondary to poor p.o. intake.   HISTORY OF PRESENT ILLNESS:  This is a 55 year old female who has  relatively low risk factors for coronary artery disease from the day of  admissions, was with her son in the x-ray department at [facility], when  she had squeezing pain under her sternum that radiated up into her neck.  She was subsequently admitted.  She was monitored and found to have  stable vital signs and negative cardiac enzymes.  She was noted to have  mild hypokalemia which appears to be secondary to poor p.o. intake of  high potassium products.   PAST MEDICAL HISTORY:  Positive for,  1. Severe asthma.  2. Mild hypercholesterolemia with an HDL of 40.  3. Obesity.  4. History of depression.  5. History of sleep apnea.   CONSULTATIONS DURING THIS ADMISSION:  None.   HOSPITAL COURSE:  She was admitted and monitored.  She did quite well.  She had no recurrence of chest pain or any other cardiopulmonary  symptoms.  She did experience headache during the night of admission,  but this was treated with an oral dose of narcotics and resolved.  Her  vital signs remained stable.  Her cardiac enzymes were normal and  negative.  While she was in the hospital, arrangements were made for her  to have an outpatient stress today at approximately 11 a.m.   MEDICATIONS AT THE TIME OF  DISCHARGE:  1. KCl 20 mEq once a day.  2. Singulair 10 mg once a day.  3. Albuterol MDI inhaler as needed.  4. Effexor XR 150 mg once a day.  5. Allegra 180 mg once a day.  6. Prednisone 10 mg currently tapering.  7. Omeprazole 40 mg once a day.   PERTINENT LABORATORY WORK:  Potassium of 3.3.  Other lab work is  unremarkable with the exception of the mild hyperglycemia at a 100.   PHYSICAL EXAMINATION AT THE TIME OF DISCHARGE:  GENERAL:  She is an  alert, pleasant female, resting comfortably.  She is in no discomfort or  distress.  VITAL SIGNS:  Her temperature is 97.2, her pulse is 76, and nonlabored  on room air, her respiratory rate is 18 and nonlabored, blood pressure  is 125/88, and pulse ox is 100% on room air.  NECK:  Supple.  LUNGS:  One isolated expiratory wheeze heard in the right mid and there  were no other  wheezing and there is no rhonchi or rales.  There is good  air movement.  CARDIOVASCULAR:  Regular rhythm without murmur, rub, or gallop.  ABDOMEN:  Mildly obese, otherwise benign.  SKIN:  Warm and dry.  EXTREMITIES:  No edema.  Pulses are 2+ and equal throughout.   DISCHARGE PLAN AND DISPOSITION:  1. She will have an outpatient cardiac stress test today at [facility].  2. She will follow up with her primary care physician Dr. Laury Axon in 1-2      week.  3. She will continue current medications with the only addition being      potassium chloride 20 mEq once a day.  4. She will return here sooner if she develops any new or worsening      symptoms.      Sanda Linger, MD  Electronically Signed     TJ/MEDQ  D:  10/22/2008  T:  10/23/2008  Job:  [id number]   cc:   Lelon Perla, DO

## 2010-11-03 ENCOUNTER — Encounter: Payer: Self-pay | Admitting: Internal Medicine

## 2010-11-03 ENCOUNTER — Ambulatory Visit (INDEPENDENT_AMBULATORY_CARE_PROVIDER_SITE_OTHER): Payer: Managed Care, Other (non HMO) | Admitting: Internal Medicine

## 2010-11-03 VITALS — BP 127/82 | HR 102 | Temp 97.4°F | Ht 63.0 in | Wt 174.8 lb

## 2010-11-03 DIAGNOSIS — J45909 Unspecified asthma, uncomplicated: Secondary | ICD-10-CM

## 2010-11-03 DIAGNOSIS — K219 Gastro-esophageal reflux disease without esophagitis: Secondary | ICD-10-CM

## 2010-11-03 DIAGNOSIS — G4730 Sleep apnea, unspecified: Secondary | ICD-10-CM

## 2010-11-03 DIAGNOSIS — B4481 Allergic bronchopulmonary aspergillosis: Secondary | ICD-10-CM

## 2010-11-03 DIAGNOSIS — Z9109 Other allergy status, other than to drugs and biological substances: Secondary | ICD-10-CM

## 2010-11-03 DIAGNOSIS — L509 Urticaria, unspecified: Secondary | ICD-10-CM

## 2010-11-03 DIAGNOSIS — J309 Allergic rhinitis, unspecified: Secondary | ICD-10-CM

## 2010-11-03 MED ORDER — TIOTROPIUM BROMIDE MONOHYDRATE 18 MCG IN CAPS: ORAL_CAPSULE | RESPIRATORY_TRACT | Status: DC

## 2010-11-03 MED ORDER — OMEPRAZOLE-SODIUM BICARBONATE 20-1100 MG PO CAPS: 1.0000 | ORAL_CAPSULE | ORAL | Status: DC

## 2010-11-03 NOTE — Patient Instructions (Signed)
We will change your ventolin to pro-air and send it to pharmacy Continue your other asthma control medications We will do spirometry now; based on this you might need prednisone burst STart zegerid OTC 20mg  daily on empty stomach for GERD (we will update this on med list)  Start QNASL samples nasal sprays 1 squirt daily into each nostril (we will update this on med list) Please see Dr. Craige Cotta for sleep apnea issues Please see DR Maple Hudson for allergy issues REturn to see me in 6 months or sooner if needed

## 2010-11-03 NOTE — Progress Notes (Signed)
Subjective:    Patient ID: Victoria [name], female    DOB: 12/19/1955, 55 y.o.   MRN: [id number]  HPI Severe persistent asthma (refuses to take ICS+LABA combo due to hives and weight gain but will take oral pred) and ABPA. MM genotype confirmed Sept 2011. On spiriva since Feb 2011, double singulair since April 2011, roflumilast since nov 2011, and chronic daily prednisone and asmanex   March 19, 2010: Last seen end FEb 2011 and May 2011. Since starting spiriva in Feb 2011 and doubling up singulair in April 2011 she states she has done well. Feels this is the longes she has gone without AE-Asthma. Feels these interventions have allowed her to wean prednisone gradually down to current dose of 5mg  daily. However, past week or so increased cough and yellow-green sputum. No increase in dyspnea and wheeze. 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 Does not feel she needs steroid burst. Feels she can manage with antibiotics. Paradoxically, spirometry today shows improvement to 1.4L/50%. REC: ADD ROFLUMILAST   April 30, 2010: Overall asthma is stable. Son underwent major T spine fusion in interim. Last week had AE-ASthma after son's caregiver came home with cold. Took abd and well now. Was busy did not start roflumilast till 04/25/2010. To;lerating it oky but having some "queaziness" in abdomen since then but she is tolerating itoverall well so far. Finding it exeponsive.  No new problems. No thad flu shot yet. REC: continue regular medications   OV May 2012: Followup after 6 months for severe persistent asthma, abpa, allergies. BEen having recurrent exacerbation at home and self titrating with prednisone bursts multiple times. She is concerned about her asthma and is open to looking at sleep and allergy issues this time around. She is not taking darliesp but continues double dose singulair and other controllers.    Review of Systems  Constitutional: Negative for fever and unexpected weight change.  HENT: Negative  for ear pain, nosebleeds, congestion, sore throat, rhinorrhea, sneezing, trouble swallowing, dental problem, postnasal drip and sinus pressure.   Eyes: Negative for redness and itching.  Respiratory: Positive for cough, chest tightness, shortness of breath and wheezing.   Cardiovascular: Negative for palpitations and leg swelling.  Gastrointestinal: Negative for nausea and vomiting.  Genitourinary: Negative for dysuria.  Musculoskeletal: Negative for joint swelling.  Skin: Negative for rash.  Neurological: Negative for headaches.  Hematological: Does not bruise/bleed easily.  Psychiatric/Behavioral: Negative for dysphoric mood. The patient is not nervous/anxious.        Objective:   Physical Exam    General:  well developed, well nourished, in no acute distressobese.   Head:  normocephalic and atraumatic   Eyes:  PERRLA/EOM intact; conjunctiva and sclera clear   Ears:  TMs intact and clear with normal canals   Nose:  no deformity, discharge, inflammation, or lesions   Mouth:  no deformity or lesions   Neck:  no masses, thyromegaly, or abnormal cervical nodes   Chest Wall:  no deformities noted   Lungs:  Prolonged expiration. FAint expiratory wheeze +. No accessory muscle use.   Heart:  regular rate and rhythm, S1, S2 without murmurs, rubs, gallops, or clicks   Abdomen:  bowel sounds positive; abdomen soft and non-tender without masses, or organomegaly   Obese +   Msk:  no deformity or scoliosis noted with normal posture   Pulses:  pulses normal   Extremities:  no clubbing, cyanosis, edema, or deformity noted   Neurologic:  CN II-XII grossly intact with normal reflexes, coordination, muscle  strength and tone   Skin:  scattered macular urticarial rash in groin, axilla, thigh   Cervical Nodes:  no significant adenopathy   Axillary Nodes:  no significant adenopathy   Psych:  alert and cooperative; normal mood and affect; normal attention span and concentration     Assessment &  Plan:

## 2010-11-05 NOTE — Procedures (Signed)
[name], Victoria [name]                 ACCOUNT NO.:  [id number]   MEDICAL RECORD NO.:  [id number]          PATIENT TYPE:  OUT   LOCATION:  SLEEP CENTER                 FACILITY[facility]  [facility]   PHYSICIAN:  Clinton D. Maple Hudson, MD, FCCP, FACPDATE OF BIRTH:  06/19/1956   DATE OF STUDY:  03/15/2006                              NOCTURNAL POLYSOMNOGRAM   REFERRING PHYSICIAN:  Dr. Suzanna Obey   INDICATIONS FOR STUDY:  Hypersomnia with sleep apnea.   </EPWORTH SLEEPINESS SCORE6/24.  BMI 29, weight 165 pounds.   HOME MEDICATIONS:  Serevent, Asmanex,  Singulair, Nasonex, Effexor.   SLEEP ARCHITECTURE:  Total sleep x 457 minutes.  With sleep efficiency 95%.  Stage I was 2%, stage II 68%, stages III and IV 14%.  REM 16% of total sleep  time.  Sleep latency 15 minutes.  REM latency 277 minutes.  Awake after  sleep onset nine minutes.  Arousal index 13.5.  No medications taken.   RESPIRATORY DATA:  Apnea hypotonia index (AHIRDI):  32.6 obstructive events  per hour indicating moderate obstructive sleep apnea hypotonia syndrome.  There were 1 central apnea, 16 obstructive apneas, and 155 hypotonia. Events  were not positional.  REMAHI 6.6 per hour.  She did not meet criteria for  CPAP titration by split protocol on this study night.   OXYGEN DATA:  Moderate to loud snoring with oxygen desaturation to a nadir  of 85%.  Mean oxygen saturation through the study was 96% on room air.   CARDIAC DATA:  Normal sinus rhythm.   MOVEMENT/PARASOMNIA:  A total of 88 limb jerks were recorded of which 10  were associated with arousal or awakening for a periodic limb movement of  arousal index of 1.3 per hour which is of doubtful significant.   IMPRESSION/RECOMMENDATIONS:  1. Moderate obstructive sleep apnea/hypotonia syndrome, AHI 22.6 per hour      with nonpositional events, moderate to loud snoring and oxygen      desaturation to 85%.  2. She did not meet criteria for CPAP titration by split protocol on the  study night but would be a candidate to return for CPAP titration if      appropriate.  Otherwise consider for alternative therapies as      indicated.      Clinton D. Maple Hudson, MD, [facility], FACP  Diplomate, Biomedical engineer of Sleep Medicine  Electronically Signed     CDY/MEDQ  D:  03/25/2006 11:16:36  T:  03/27/2006 01:17:54  Job:  [id number]

## 2010-11-05 NOTE — Assessment & Plan Note (Signed)
[facility]                               PULMONARY OFFICE NOTE   JUN, RIGHTMYER                          MRN:          [id number]  DATE:03/21/2006                            DOB:          11-17-1955    Victoria [name] returns today in followup. This is a 55 year old white female,  complicated woman with severe atopic asthma, severe persistent asthma,  failure to respond to Xolair therapy, chronic recurrent sinusitis, and  extremely high IgE levels. She has had continued cough, dyspnea and  increased post-nasal drainage the last several weeks. We treated her for a  left maxillary sinusitis in May of this year. She was seen by Dr. Mindi Slicker  of [facility] in June of this year, who recommend continued antibiotic  therapy and followup CAT scanning of the sinuses which are pending. She did  also have a sleep study performed, the results of which are pending.  Medication wise, she has not followed through on many of our  recommendations. She did not switch to Zyflo as recommended at the last  visit because of expense. She also did not maintain Protonix because of  diarrhea. She still has reflux symptoms on Boniva once monthly.   CURRENT MEDICATIONS:  1. Singulair 10 mg daily.  2. Asmanex 1 spray, b.i.d.  3. Serevent 1 spray, b.i.d.  4. Effexor 150 mg daily.  5. Mucinex p.r.n.  6. Nasonex 2 sprays b.i.d. each nostril.  7. Boniva 150 mg monthly.   She did not fill and continue the Qvar as prescribed, because of expense.   PHYSICAL EXAMINATION:  VITAL SIGNS:  Temperature 98, pulse 91, room air  saturation 98%, blood pressure 110/80.  GENERAL:  This is a middle aged female in no distress.  CHEST:  Distant breath sounds with prolonged respiratory phase. Expired  wheezes are noted, no rhonchi are noted, no rales are noted.  CARDIAC:  Regular rate and rhythm without S3, normal S1 and S2.  ABDOMEN:  Soft, non-tender.  EXTREMITIES:  No edema or clubbing  or venous disease.  SKIN:  Clear.  NEUROLOGIC:  Intact.  HEENT:  Mild nasal inflammation.   Pulmonary function showed an FEV1 of 1.75, FEC 2.88, FEV1/FVC ratio of 61%  of predicted. Total lung capacity at 100% predicated, diffusion capacity to  82% predicted. Values are compatible with moderate persistent obstructive  defect.   IMPRESSION:  Is that of severe persistent asthma, significant atopic  features. Medication non-adherence due to medication cost issues.   RECOMMENDATIONS:  The patient was given under strongest of terms a need to  follow recommended medical advice today. In this regard, she is to  discontinue Asmanex, begin Qvar at 80 mcg strength 4 sprays b.i.d., samples  were given. Trial of Zegerid 40 mg daily to see if she can tolerate this  from a gastrointestinal standpoint. I have called Dr. Vincente Poli, her  gynecologist physician, to see if we can switch the Boniva to [facility] and get  away from drugs that will aggravate reflux. She will begin Augmentin 875 mg  b.i.d. for 10  days for persistent sinusitis. She will resume Zyflo at 600 mg  q.i.d. and discontinue Singulair. She will maintain the Serevent as is, one  spray b.i.d. She will maintain Nasonex as is, and we will see the patient  back in return followup in a months' time.       Charlcie Cradle Delford Field, MD, FCCP      PEW/MedQ  DD:  03/21/2006  DT:  03/23/2006  Job #:  [id number]   cc:   Lelon Perla, DO

## 2010-11-13 DIAGNOSIS — Z9109 Other allergy status, other than to drugs and biological substances: Secondary | ICD-10-CM | POA: Insufficient documentation

## 2010-11-13 DIAGNOSIS — G4733 Obstructive sleep apnea (adult) (pediatric): Secondary | ICD-10-CM | POA: Insufficient documentation

## 2010-11-13 NOTE — Assessment & Plan Note (Signed)
Start zegerid

## 2010-11-13 NOTE — Assessment & Plan Note (Signed)
Refer Dr Craige Cotta. She has finally agreed to see sleep specialist for estblished dx of OSA for which she is non compliant

## 2010-11-13 NOTE — Assessment & Plan Note (Signed)
Refer Dr Maple Hudson

## 2010-11-13 NOTE — Assessment & Plan Note (Signed)
Symptomatic and recent recurrent aeasthma at home with self titraton of prednisone at home. Suprisingly spirometry is better than before. Therefore, hesistant to place her on sterid burset. Instaed, discussed other control mechanisms as below  PLAN We will change your ventolin to pro-air and send it to pharmacy Continue your other asthma control medications STart zegerid OTC 20mg  daily on empty stomach for GERD (we will update this on med list)  Start QNASL samples nasal sprays 1 squirt daily into each nostril (we will update this on med list) Please see Dr. Craige Cotta for sleep apnea issues Please see DR Maple Hudson for allergy issues Continue controller medications including double dose singulair REturn to see me in 6 months or sooner if needed

## 2010-11-13 NOTE — Assessment & Plan Note (Signed)
Refer Dr Maple Hudson for allergy eval - she finally desires this

## 2010-11-13 NOTE — Assessment & Plan Note (Signed)
Currently not interested in rechallenge with itraconazole

## 2010-11-26 ENCOUNTER — Encounter: Payer: Self-pay | Admitting: Family Medicine

## 2010-11-26 ENCOUNTER — Ambulatory Visit (INDEPENDENT_AMBULATORY_CARE_PROVIDER_SITE_OTHER): Payer: Managed Care, Other (non HMO) | Admitting: Family Medicine

## 2010-11-26 DIAGNOSIS — E785 Hyperlipidemia, unspecified: Secondary | ICD-10-CM

## 2010-11-26 NOTE — Progress Notes (Signed)
  Subjective:    Patient ID: Victoria [name], female    DOB: 04-20-1956, 55 y.o.   MRN: [id number]  HPI Pt here to review labs only.   Review of Systems    as above Objective:   Physical Exam  Constitutional: She appears well-developed and well-nourished.  Cardiovascular: Normal rate and regular rhythm.   Pulmonary/Chest: Effort normal and breath sounds normal.  Psychiatric: She has a normal mood and affect. Her behavior is normal. Judgment and thought content normal. Cognition and memory are normal.          Assessment & Plan:

## 2010-11-26 NOTE — Assessment & Plan Note (Signed)
Pt c/o myalgias in legs and feet---hold zocor for 2 weeks to see if resolves See [facility] --scanned in Recheck 6 months

## 2010-11-30 ENCOUNTER — Institutional Professional Consult (permissible substitution): Payer: Managed Care, Other (non HMO) | Admitting: Pulmonary Disease

## 2010-12-01 ENCOUNTER — Encounter: Payer: Self-pay | Admitting: Family Medicine

## 2010-12-10 ENCOUNTER — Telehealth: Payer: Self-pay

## 2010-12-10 MED ORDER — PITAVASTATIN CALCIUM 2 MG PO TABS: 1.0000 | ORAL_TABLET | Freq: Every day | ORAL | Status: DC

## 2010-12-10 NOTE — Telephone Encounter (Signed)
Spoke with patient and she stated she stopped the Simvastatin and the leg pain has improved, she wanted to know if Dr.Lowne wanted to start her on something else for her cholesterol. She is not due to come back for 3 mos for repeat labs and stated she can not take Lipitor either. Please advise     KP

## 2010-12-10 NOTE — Telephone Encounter (Signed)
Spoke with patient and she agreed to Rx and supplement --Faxed to pharmacy and samples at check in     [place]

## 2010-12-10 NOTE — Telephone Encounter (Signed)
livalo 2 mg daily  #30   2 refills---can leave samples ----also take Co Q10 with it

## 2010-12-30 ENCOUNTER — Other Ambulatory Visit: Payer: Self-pay | Admitting: *Deleted

## 2010-12-30 NOTE — Telephone Encounter (Signed)
How long has she been on 75mg --if 3-4 weeks----decrease to 37.5  Mg  1 po qd for 1 month

## 2010-12-30 NOTE — Telephone Encounter (Signed)
Left message to call office

## 2010-12-30 NOTE — Telephone Encounter (Signed)
Pt states that she is trying to wean off med. Pt is requesting a #90 day supply for Effexor 75 mg.Please advise

## 2010-12-31 NOTE — Telephone Encounter (Signed)
Pt notes that she has been taking 37.5 mg tid for the past 6 month. Ok to leave detail message on VM.

## 2010-12-31 NOTE — Telephone Encounter (Signed)
Take 2 a day for 1 month then 1 a day for at least a month then stop

## 2010-12-31 NOTE — Telephone Encounter (Signed)
Verbally advise dr Laury Axon per Pt does not want to d/c med would like to decrease down to 75 mg. Per dr Laury Axon advise Pt to decrease to 2 tab to see how that works for her . Left pt detail message with instructions and to call with any concerns.

## 2011-01-11 ENCOUNTER — Ambulatory Visit (INDEPENDENT_AMBULATORY_CARE_PROVIDER_SITE_OTHER): Payer: Self-pay | Admitting: General Surgery

## 2011-01-11 ENCOUNTER — Other Ambulatory Visit: Payer: Self-pay | Admitting: *Deleted

## 2011-01-11 MED ORDER — MONTELUKAST SODIUM 10 MG PO TABS: 10.0000 mg | ORAL_TABLET | Freq: Two times a day (BID) | ORAL | Status: DC

## 2011-01-11 MED ORDER — PREDNISONE 5 MG PO TABS: ORAL_TABLET | ORAL | Status: DC

## 2011-01-12 ENCOUNTER — Other Ambulatory Visit: Payer: Self-pay | Admitting: *Deleted

## 2011-01-12 NOTE — Telephone Encounter (Signed)
Pt states that she has a little cough and would like to have a refill on TUSSIONEX PENNKINETIC ER 8-10 MG/5ML LQCR 1 tsp by mouth at bedtime as needed cough, which was Rx back in 2011. Pt advise that this is not a maintenance med and if she is having some acute issue she needs to be seen in office. Pt decline OV.

## 2011-01-12 NOTE — Telephone Encounter (Signed)
mssg left for patient to call the office   KP

## 2011-01-12 NOTE — Telephone Encounter (Signed)
If she is still having issues she needs to talk to Pulm---she was supposed to see Dr Maple Hudson and Dr Craige Cotta after she saw Dr Laray Anger last

## 2011-01-14 NOTE — Telephone Encounter (Signed)
Message left for patient to return my call.  

## 2011-01-18 NOTE — Telephone Encounter (Signed)
Left message to call office

## 2011-01-19 NOTE — Telephone Encounter (Signed)
Discuss with patient  

## 2011-01-27 ENCOUNTER — Ambulatory Visit (INDEPENDENT_AMBULATORY_CARE_PROVIDER_SITE_OTHER): Payer: Managed Care, Other (non HMO) | Admitting: General Surgery

## 2011-02-03 ENCOUNTER — Institutional Professional Consult (permissible substitution): Payer: Managed Care, Other (non HMO) | Admitting: Internal Medicine

## 2011-02-24 ENCOUNTER — Encounter (INDEPENDENT_AMBULATORY_CARE_PROVIDER_SITE_OTHER): Payer: Self-pay | Admitting: General Surgery

## 2011-02-24 ENCOUNTER — Ambulatory Visit (INDEPENDENT_AMBULATORY_CARE_PROVIDER_SITE_OTHER): Payer: Managed Care, Other (non HMO) | Admitting: General Surgery

## 2011-02-24 VITALS — BP 130/86 | HR 58 | Temp 98.4°F | Ht 63.0 in | Wt 169.5 lb

## 2011-02-24 DIAGNOSIS — K439 Ventral hernia without obstruction or gangrene: Secondary | ICD-10-CM

## 2011-02-24 NOTE — Patient Instructions (Signed)
The diffuse bulge in hour upper abdomen is a condition called diastasis  recti. It does not require surgical intervention. I do think I feel a reducible bulge in your umbilicus and perhaps a second one above your umbilicus  which may be a hernia as well. We will schedule a CT scan of the abdomen and pelvis to see if these can be defined. The previous CT scan did not demonstrate a hernia. I will see you back in one month. You may require a laparoscopic ventral hernia repair with mesh.

## 2011-02-24 NOTE — Progress Notes (Signed)
Chief Complaint  Patient presents with  . Other    new pt- eval of ventral hernia     HPI Victoria [name] is a 55 y.o. female.    She is referred to me by Dr. Rogelia Boga  for evaluation of the abdominal wall to see if she has a hernia. Dr. [name] is her primary care physician and Dr. Marchelle Gearing is her pulmonologist.  The patient is noticed a bulge in her upper abdomen for 2-3  years. She states it is getting bigger. It has become more painful and sometimes it hurts so much that she has to lie down for a while. She has no gastrointestinal symptoms. She has a son with cerebral palsey and she  has to do lifting and repositioning.  She saw Dr. Bertram Savin on December 24, 2009. Dr. Freida Busman felt that she had a diastasis recti but not a true hernia. She had a CT scan one year ago which did not demonstrate a hernia.  Her past medical history is significant for an open appendectomy, a cesarean section through a Pfannenstiel incision, bronchitis and asthma. She also has mild depression. HPI  Past Medical History  Diagnosis Date  . Asthma   . Obesity   . Maxillary sinusitis   . Depression   . Osteoarthritis   . IBS (irritable bowel syndrome)   . Hyperlipidemia   . COPD (chronic obstructive pulmonary disease)   . Pulmonary nodules   . OSA (obstructive sleep apnea)     Mod OSA by hx in 2007.  AHI 22.5 02/2606  . Osteoporosis   . Nasal congestion 9/12    current cold  . Cough   . Wheezing     Past Surgical History  Procedure Date  . Appendectomy   . Cesarean section     Family History  Problem Relation Age of Onset  . Breast cancer Mother   . Cancer Mother 64    breast that metasticized to liver  . Prostate cancer Father   . Diverticulosis Father   . Cancer Father 32    prostate     Social History History  Substance Use Topics  . Smoking status: Never Smoker   . Smokeless tobacco: Not on file  . Alcohol Use: 2.4 oz/week    2 Glasses of wine, 2 Cans of beer per week     Allergies  Allergen Reactions  . Beclomethasone Dipropionate     REACTION: hives and weight gain  . Dulera     REACTION: weight gain and hives  . Sulfonamide Derivatives     REACTION: Rash  . Symbicort     REACTION: gave patient hives    Current Outpatient Prescriptions  Medication Sig Dispense Refill  . albuterol (PROAIR HFA) 108 (90 BASE) MCG/ACT inhaler Inhale 2 puffs into the lungs every 6 (six) hours as needed.        Marland Kitchen aspirin 81 MG tablet Take 81 mg by mouth daily.        . Calcium-Magnesium-Vitamin D (CALCIUM MAGNESIUM PO) Take by mouth daily. shaklee osteomatrix- contains calsium, magnesium, vitamin d, vitamin k, zinc, copper and manganese       . chlorpheniramine-HYDROcodone (TUSSIONEX PENNKINETIC ER) 10-8 MG/5ML LQCR Take 5 mLs by mouth at bedtime as needed.        . Cholecalciferol (VITAMIN D) 2000 UNITS CAPS Take 2,000 Units by mouth.        . Coenzyme Q10 (COQ10 PO) Take by mouth daily.        Marland Kitchen  mometasone (ASMANEX) 220 MCG/INH inhaler 2 puffs in evening  3 Inhaler  3  . montelukast (SINGULAIR) 10 MG tablet Take 1 tablet (10 mg total) by mouth 2 (two) times daily.  180 tablet  4  . NON FORMULARY Shaklee Immunity: 1 by mouth daily       . NON FORMULARY Shaklee Carotomax: 1 by mouth daily       . NON FORMULARY Shaklee Osteomatrix: 1 by mouth daily       . Omega-3 Fatty Acids (OMEGA 3 PO) Take by mouth.        . tiotropium (SPIRIVA HANDIHALER) 18 MCG inhalation capsule 1 puff in morning  90 capsule  3  . venlafaxine (EFFEXOR) 37.5 MG tablet 3 by mouth daily  270 tablet  3  . LORazepam (ATIVAN) 0.5 MG tablet Take 1 tablet by mouth as needed.      Maxwell Caul Bicarbonate (ZEGERID) 20-1100 MG CAPS Take 1 capsule by mouth every morning.  30 each  2  . Pitavastatin Calcium 2 MG TABS Take 1 tablet (2 mg total) by mouth at bedtime.  30 tablet  2  . predniSONE (DELTASONE) 5 MG tablet 1 tab daily by mouth  90 tablet  4  . simvastatin (ZOCOR) 40 MG tablet Take 40 mg by  mouth at bedtime.          Review of Systems Review of Systems  Constitutional: Negative.   HENT: Positive for congestion. Negative for hearing loss, ear pain, nosebleeds, facial swelling, rhinorrhea, sneezing, neck pain, neck stiffness, postnasal drip, tinnitus and ear discharge.   Eyes: Negative.   Respiratory: Positive for cough and wheezing. Negative for apnea, choking, chest tightness, shortness of breath and stridor.   Cardiovascular: Negative.   Gastrointestinal: Positive for abdominal pain and abdominal distention. Negative for nausea, vomiting, diarrhea, constipation, blood in stool, anal bleeding and rectal pain.  Genitourinary: Negative.   Musculoskeletal: Negative.   Skin: Negative.   Hematological: Negative.   Psychiatric/Behavioral: Negative.     Blood pressure 130/86, pulse 58, temperature 98.4 F (36.9 C), height 5\' 3"  (1.6 m), weight 169 lb 8 oz (76.885 kg).  Physical Exam Physical Exam  Constitutional: She is oriented to person, place, and time. She appears well-developed and well-nourished. No distress.       Central obesity  HENT:  Head: Normocephalic and atraumatic.  Nose: Nose normal.  Mouth/Throat: No oropharyngeal exudate.  Eyes: Conjunctivae are normal. Pupils are equal, round, and reactive to light. Left eye exhibits no discharge. No scleral icterus.  Neck: Normal range of motion. Neck supple. No JVD present. No tracheal deviation present. No thyromegaly present.  Cardiovascular: Normal rate, regular rhythm and intact distal pulses.  Exam reveals no gallop.   Murmur heard. Pulmonary/Chest: Effort normal and breath sounds normal.  Abdominal: Soft. Bowel sounds are normal. She exhibits mass. She exhibits no distension. There is no tenderness. There is no rebound and no guarding.    Musculoskeletal: Normal range of motion. She exhibits edema. She exhibits no tenderness.  Lymphadenopathy:    She has no cervical adenopathy.  Neurological: She is alert and  oriented to person, place, and time. She exhibits normal muscle tone. Coordination normal.  Skin: Skin is dry. No rash noted. She is not diaphoretic. No erythema. No pallor.  Psychiatric: She has a normal mood and affect. Her behavior is normal. Judgment and thought content normal.    Data Reviewed I have reviewed our old records and Dr. Eliot Ford assessment from last year.  I reviewed the CT scan from last year. I revealed Dr. Lynnell Dike office notes.  Assessment    Probable umbilical hernia and low-lying epigastric hernia. These appear to be reducible but are somewhat tender. It is possible these are the source of her pain.  Diastases recti. This is also present. This is not a surgical problem.  Asthma and bronchitis.  Central obesity.  Depression.  Status post open appendectomy.  Status post cesarean section.    Plan    I am going to repeat her CT scan to be sure, and I will inform the radiologist of my physical exam findings so they may ocuse attention on the central and upper abdominal wall.  She'll return to see me in 3-4 weeks and we will discuss whether we should consider laparoscopic ventral hernia repair with mesh.       Bastien Strawser M 02/24/2011, 11:34 AM

## 2011-02-28 ENCOUNTER — Ambulatory Visit
Admission: RE | Admit: 2011-02-28 | Discharge: 2011-02-28 | Disposition: A | Discharge status: Home / Self Care | Payer: Managed Care, Other (non HMO) | Source: Ambulatory Visit | Attending: General Surgery | Admitting: General Surgery

## 2011-02-28 DIAGNOSIS — K439 Ventral hernia without obstruction or gangrene: Secondary | ICD-10-CM

## 2011-02-28 MED ORDER — IOHEXOL 300 MG/ML  SOLN
100.0000 mL | Freq: Once | INTRAMUSCULAR | Status: AC | PRN
  Administered 2011-02-28: 100 mL via INTRAVENOUS

## 2011-03-03 ENCOUNTER — Telehealth (INDEPENDENT_AMBULATORY_CARE_PROVIDER_SITE_OTHER): Payer: Self-pay

## 2011-03-03 NOTE — Telephone Encounter (Signed)
Ct reviewed with Dr Derrell Lolling. Pt notified of ct results per Dr Aura Camps request. Pt to keep October appt to discuss these results further.

## 2011-03-28 ENCOUNTER — Encounter (INDEPENDENT_AMBULATORY_CARE_PROVIDER_SITE_OTHER): Payer: Managed Care, Other (non HMO) | Admitting: General Surgery

## 2011-04-05 ENCOUNTER — Encounter (INDEPENDENT_AMBULATORY_CARE_PROVIDER_SITE_OTHER): Payer: Self-pay | Admitting: General Surgery

## 2011-04-05 ENCOUNTER — Ambulatory Visit (INDEPENDENT_AMBULATORY_CARE_PROVIDER_SITE_OTHER): Payer: Managed Care, Other (non HMO) | Admitting: General Surgery

## 2011-04-05 VITALS — BP 116/76 | HR 70 | Temp 97.4°F | Resp 16 | Ht 63.0 in | Wt 170.6 lb

## 2011-04-05 DIAGNOSIS — K439 Ventral hernia without obstruction or gangrene: Secondary | ICD-10-CM

## 2011-04-05 NOTE — Progress Notes (Signed)
Chief Complaint  Patient presents with  . Follow-up    CT Scan / hernia    HPI Victoria [name] is a 55 y.o. female.    Patient returns for evaluation of her anterior abdominal  wall hernia. She states that she is having less problems with her asthma and less coughing and is therefore having less pain. She has has a lot going on with her family and would like to avoid any surgical intervention at this time unless her symptoms get worse.  CT scan was performed on February 24, 2011. This shows a small anterior midline abdominal wall hernia containing only omental fat. Also noted were small bilateral inguinal hernias containing fat. She has a small hiatal hernia and a small Bochdalek hernia. I discussed the CT scan findings with her. She has no symptoms in her groin. She has no swallowing problems or chest pain.HPI  Past Medical History  Diagnosis Date  . Asthma   . Obesity   . Maxillary sinusitis   . Depression   . Osteoarthritis   . IBS (irritable bowel syndrome)   . Hyperlipidemia   . COPD (chronic obstructive pulmonary disease)   . Pulmonary nodules   . OSA (obstructive sleep apnea)     Mod OSA by hx in 2007.  AHI 22.5 02/2606  . Osteoporosis   . Nasal congestion 9/12    current cold  . Cough   . Wheezing     Past Surgical History  Procedure Date  . Appendectomy   . Cesarean section     Family History  Problem Relation Age of Onset  . Breast cancer Mother   . Cancer Mother 66    breast that metasticized to liver  . Prostate cancer Father   . Diverticulosis Father   . Cancer Father 48    prostate     Social History History  Substance Use Topics  . Smoking status: Never Smoker   . Smokeless tobacco: Never Used  . Alcohol Use: 2.4 oz/week    2 Glasses of wine, 2 Cans of beer per week    Allergies  Allergen Reactions  . Beclomethasone Dipropionate     REACTION: hives and weight gain  . Dulera     REACTION: weight gain and hives  . Sulfonamide Derivatives    REACTION: Rash  . Symbicort     REACTION: gave patient hives    Current Outpatient Prescriptions  Medication Sig Dispense Refill  . albuterol (PROAIR HFA) 108 (90 BASE) MCG/ACT inhaler Inhale 2 puffs into the lungs every 6 (six) hours as needed.        Marland Kitchen aspirin 81 MG tablet Take 81 mg by mouth daily.        . Calcium-Magnesium-Vitamin D (CALCIUM MAGNESIUM PO) Take by mouth daily. shaklee osteomatrix- contains calsium, magnesium, vitamin d, vitamin k, zinc, copper and manganese       . chlorpheniramine-HYDROcodone (TUSSIONEX PENNKINETIC ER) 10-8 MG/5ML LQCR Take 5 mLs by mouth at bedtime as needed.        . Cholecalciferol (VITAMIN D) 2000 UNITS CAPS Take 2,000 Units by mouth.        . Coenzyme Q10 (COQ10 PO) Take by mouth daily.        Marland Kitchen LORazepam (ATIVAN) 0.5 MG tablet Take 1 tablet by mouth as needed.      . mometasone (ASMANEX) 220 MCG/INH inhaler 2 puffs in evening  3 Inhaler  3  . montelukast (SINGULAIR) 10 MG tablet Take 1 tablet (10 mg  total) by mouth 2 (two) times daily.  180 tablet  4  . NON FORMULARY Shaklee Immunity: 1 by mouth daily       . NON FORMULARY Shaklee Carotomax: 1 by mouth daily       . NON FORMULARY Shaklee Osteomatrix: 1 by mouth daily       . Omega-3 Fatty Acids (OMEGA 3 PO) Take by mouth.        Maxwell Caul Bicarbonate (ZEGERID) 20-1100 MG CAPS Take 1 capsule by mouth every morning.  30 each  2  . Pitavastatin Calcium 2 MG TABS Take 1 tablet (2 mg total) by mouth at bedtime.  30 tablet  2  . predniSONE (DELTASONE) 5 MG tablet 1 tab daily by mouth  90 tablet  4  . simvastatin (ZOCOR) 40 MG tablet Take 40 mg by mouth at bedtime.        Marland Kitchen tiotropium (SPIRIVA HANDIHALER) 18 MCG inhalation capsule 1 puff in morning  90 capsule  3  . venlafaxine (EFFEXOR) 37.5 MG tablet 3 by mouth daily  270 tablet  3    Review of Systems Review of Systems 12 system review of systems is performed and is negative except as described above. Blood pressure 116/76, pulse 70,  temperature 97.4 F (36.3 C), temperature source Temporal, resp. rate 16, height 5\' 3"  (1.6 m), weight 170 lb 9.6 oz (77.384 kg).  Physical Exam Physical Exam Lungs clear to auscultation.  Abdomen obese. Soft. She has a reducible hernia in the midline about 5 cm above the umbilicus. She also has a small umbilical hernia that is reducible with some effort. I do not feel any hernia in either inguinal area with her standing.  Neurologic no motor or sensory deficits. Gait is normal.Data Reviewed I reviewed the CT scan.  Assessment    Epigastric hernia.  Umbilical hernia.  Obesity.  Gastroesophageal reflux disease.  Asthma     Plan    It was her desire to postpone any consideration of surgical intervention of her hernias at this time because of complex family issues and because she is minimally symptomatic.  I told her that at some point in time should this would probably become necessary, but this could be postponed with reasonable safety.  I told her that most likely we would approach this with laparoscopic hernia repair with mesh. I described that in detail. Risks were outlined in detail. She understands all this quite well.  She will go home and think about this. She states she may call me back in a few months to go ahead and schedule surgery. I told her that I would like to do a examination the office preop. She will call me when she is ready.       Abiel Antrim M 04/05/2011, 11:56 AM

## 2011-04-05 NOTE — Patient Instructions (Signed)
The CT scan shows an epigastric hernia containing fat only. On physical exam I can feel both an epigastric hernia and a small umbilical hernia. The CT scan also showed a small inguinal hernias, but I do not detect these on exam. At some point in the future you should consider having a laparoscopic repair of your abdominal wall hernias with mesh. There is no emergency to this, and so please call me when you feel ready to have this done.

## 2011-04-20 ENCOUNTER — Ambulatory Visit (INDEPENDENT_AMBULATORY_CARE_PROVIDER_SITE_OTHER): Payer: Managed Care, Other (non HMO)

## 2011-04-20 ENCOUNTER — Ambulatory Visit: Payer: Managed Care, Other (non HMO) | Admitting: Internal Medicine

## 2011-04-20 DIAGNOSIS — Z23 Encounter for immunization: Secondary | ICD-10-CM

## 2011-04-28 ENCOUNTER — Ambulatory Visit (INDEPENDENT_AMBULATORY_CARE_PROVIDER_SITE_OTHER)
Admission: RE | Admit: 2011-04-28 | Discharge: 2011-04-28 | Disposition: A | Discharge status: Home / Self Care | Payer: Managed Care, Other (non HMO) | Source: Ambulatory Visit | Attending: Pulmonary Disease | Admitting: Pulmonary Disease

## 2011-04-28 ENCOUNTER — Encounter: Payer: Self-pay | Admitting: Pulmonary Disease

## 2011-04-28 ENCOUNTER — Ambulatory Visit (INDEPENDENT_AMBULATORY_CARE_PROVIDER_SITE_OTHER): Payer: Managed Care, Other (non HMO) | Admitting: Pulmonary Disease

## 2011-04-28 ENCOUNTER — Other Ambulatory Visit: Payer: Self-pay | Admitting: *Deleted

## 2011-04-28 ENCOUNTER — Telehealth: Payer: Self-pay | Admitting: Internal Medicine

## 2011-04-28 VITALS — BP 106/82 | HR 88 | Temp 98.1°F | Ht 63.0 in | Wt 171.0 lb

## 2011-04-28 DIAGNOSIS — J45901 Unspecified asthma with (acute) exacerbation: Secondary | ICD-10-CM

## 2011-04-28 MED ORDER — PREDNISONE 5 MG PO TABS: ORAL_TABLET | ORAL | Status: DC

## 2011-04-28 MED ORDER — HYDROCOD POLST-CHLORPHEN POLST 10-8 MG/5ML PO LQCR: 5.0000 mL | Freq: Every evening | ORAL | Status: DC | PRN

## 2011-04-28 MED ORDER — AZITHROMYCIN 250 MG PO TABS: ORAL_TABLET | ORAL | Status: AC

## 2011-04-28 MED ORDER — PREDNISONE 10 MG PO TABS: ORAL_TABLET | ORAL | Status: DC

## 2011-04-28 NOTE — Assessment & Plan Note (Signed)
She has an acute exacerbation.  Will give a course of prednisone and zithromax.  Will get a chest xray and call with results.  She is scheduled for follow up with Dr. Marchelle Gearing in December.  Advised her to call sooner if symptoms do not improve.

## 2011-04-28 NOTE — Telephone Encounter (Signed)
Called and spoke with pt.  Pt c/o "barking" cough, wheezing, tightness in chest and sob.  MR not in office today.  Offered pt an appt with VS.  Pt agreed to come in today to see VS at 2:30 pm.

## 2011-04-28 NOTE — Progress Notes (Signed)
Addended by: Tommie Sams on: 04/28/2011 03:18 PM   Modules accepted: Orders

## 2011-04-28 NOTE — Progress Notes (Signed)
Chief Complaint  Patient presents with  . Acute visit    Pt c/o increase sob, dry cough, wheezing, chest tightness x 1 month  . Medication Refill    tussionex    History of Present Illness: Victoria [name] is a 55 y.o. female severe persistent asthma and ABPA.  She is normal followed by Dr. Marchelle [name].  She is here for an acute visit.    She did well over the Summer, and did not need prednisone until last month.  She developed a cough, wheeze, chest congestion, and worsening dyspnea.  She had a script for prednisone taper, and took this.  She felt better, but her symptoms recurred after she stopped prednisone.    She now has a barking cough.  She denies fever, sputum, hemoptysis, sinus congestion, sore throat, abdominal symptoms, gland swelling, or leg swelling.  She has not had ear symptoms.  She has been using her albuterol more frequently.  This helps, but does not last.  Past Medical History  Diagnosis Date  . Asthma   . Obesity   . Maxillary sinusitis   . Depression   . Osteoarthritis   . IBS (irritable bowel syndrome)   . Hyperlipidemia   . COPD (chronic obstructive pulmonary disease)   . Pulmonary nodules   . OSA (obstructive sleep apnea)     Mod OSA by hx in 2007.  AHI 22.5 02/2606  . Osteoporosis   . Nasal congestion 9/12    current cold  . Cough   . Wheezing     Past Surgical History  Procedure Date  . Appendectomy   . Cesarean section     Current Outpatient Prescriptions on File Prior to Visit  Medication Sig Dispense Refill  . albuterol (PROAIR HFA) 108 (90 BASE) MCG/ACT inhaler Inhale 2 puffs into the lungs every 6 (six) hours as needed.        Marland Kitchen aspirin 81 MG tablet Take 81 mg by mouth daily.        . Calcium-Magnesium-Vitamin D (CALCIUM MAGNESIUM PO) Take by mouth daily. shaklee osteomatrix- contains calsium, magnesium, vitamin d, vitamin k, zinc, copper and manganese       . Cholecalciferol (VITAMIN D) 2000 UNITS CAPS Take 2,000 Units by mouth.        .  Coenzyme Q10 (COQ10 PO) Take by mouth daily.        . mometasone (ASMANEX) 220 MCG/INH inhaler 2 puffs in evening  3 Inhaler  3  . montelukast (SINGULAIR) 10 MG tablet Take 1 tablet (10 mg total) by mouth 2 (two) times daily.  180 tablet  4  . NON FORMULARY Shaklee Immunity: 1 by mouth daily       . Omega-3 Fatty Acids (OMEGA 3 PO) Take by mouth.        Victoria [name] Bicarbonate (ZEGERID) 20-1100 MG CAPS Take 1 capsule by mouth every morning.  30 each  2  . tiotropium (SPIRIVA HANDIHALER) 18 MCG inhalation capsule 1 puff in morning  90 capsule  3  . venlafaxine (EFFEXOR) 37.5 MG tablet 3 by mouth daily  270 tablet  3  . chlorpheniramine-HYDROcodone (TUSSIONEX PENNKINETIC ER) 10-8 MG/5ML LQCR Take 5 mLs by mouth at bedtime as needed.          Allergies  Allergen Reactions  . Beclomethasone Dipropionate     REACTION: hives and weight gain  . Dulera     REACTION: weight gain and hives  . Sulfonamide Derivatives     REACTION: Rash  .  Symbicort     REACTION: gave patient hives    Physical Exam:  Blood pressure 106/82, pulse 88, temperature 98.1 F (36.7 C), temperature source Oral, height 5\' 3"  (1.6 m), weight 171 lb (77.565 kg), SpO2 98.00%.  General - Obese HEENT - no sinus tenderness, no oral exudate, no LAN, TM clear Cardiac - s1s2 regular, no murmur Chest - prolonged exhalation, b/l expiratory wheezing, no rales/dullness Abdomen - soft, nontender Extremities - no e/c/c Skin - no rashes Neurologic - normal strength, CN intact Psychiatric - normal mood, behavior   Assessment/Plan:  ASTHMA UNSPECIFIED WITH EXACERBATION She has an acute exacerbation.  Will give a course of prednisone and zithromax.  Will get a chest xray and call with results.  She is scheduled for follow up with Dr. Marchelle [name] in December.  Advised her to call sooner if symptoms do not improve.     Outpatient Encounter Prescriptions as of 04/28/2011  Medication Sig Dispense Refill  . albuterol  (PROAIR HFA) 108 (90 BASE) MCG/ACT inhaler Inhale 2 puffs into the lungs every 6 (six) hours as needed.        Marland Kitchen aspirin 81 MG tablet Take 81 mg by mouth daily.        . Calcium-Magnesium-Vitamin D (CALCIUM MAGNESIUM PO) Take by mouth daily. shaklee osteomatrix- contains calsium, magnesium, vitamin d, vitamin k, zinc, copper and manganese       . Cholecalciferol (VITAMIN D) 2000 UNITS CAPS Take 2,000 Units by mouth.        . Coenzyme Q10 (COQ10 PO) Take by mouth daily.        . mometasone (ASMANEX) 220 MCG/INH inhaler 2 puffs in evening  3 Inhaler  3  . montelukast (SINGULAIR) 10 MG tablet Take 1 tablet (10 mg total) by mouth 2 (two) times daily.  180 tablet  4  . NON FORMULARY Shaklee Immunity: 1 by mouth daily       . Omega-3 Fatty Acids (OMEGA 3 PO) Take by mouth.        Victoria [name] Bicarbonate (ZEGERID) 20-1100 MG CAPS Take 1 capsule by mouth every morning.  30 each  2  . tiotropium (SPIRIVA HANDIHALER) 18 MCG inhalation capsule 1 puff in morning  90 capsule  3  . venlafaxine (EFFEXOR) 37.5 MG tablet 3 by mouth daily  270 tablet  3  . DISCONTD: predniSONE (DELTASONE) 5 MG tablet 1 tab daily by mouth  90 tablet  0  . azithromycin (ZITHROMAX Z-PAK) 250 MG tablet Take 2 tablets (500 mg) on  Day 1,  followed by 1 tablet (250 mg) once daily on Days 2 through 5.  6 each  0  . chlorpheniramine-HYDROcodone (TUSSIONEX PENNKINETIC ER) 10-8 MG/5ML LQCR Take 5 mLs by mouth at bedtime as needed.        . predniSONE (DELTASONE) 10 MG tablet 4 pills for 2 days, 3 pills for 2 days, 2 pills for 2 days, 1 pill for 2 days  20 tablet  0  . DISCONTD: LORazepam (ATIVAN) 0.5 MG tablet Take 1 tablet by mouth as needed.      Marland Kitchen DISCONTD: NON FORMULARY Shaklee Carotomax: 1 by mouth daily       . DISCONTD: NON FORMULARY Shaklee Osteomatrix: 1 by mouth daily       . DISCONTD: Pitavastatin Calcium 2 MG TABS Take 1 tablet (2 mg total) by mouth at bedtime.  30 tablet  2  . DISCONTD: simvastatin (ZOCOR) 40 MG tablet  Take 40 mg by mouth at  bedtime.          Victoria [name] Pager:  [phone] 04/28/2011, 2:50 PM

## 2011-04-28 NOTE — Patient Instructions (Signed)
Prednisone 10 mg pills: 4 pills for 2 days, 3 pills for 2 days, 2 pills for 2 days, 1 pill for 2 days Zithromax 250 mg pills: 2 pills on first day, then one pill daily for 4 days Chest xray today>>will call with results Follow up with Dr. Marchelle Gearing in December

## 2011-04-29 ENCOUNTER — Telehealth: Payer: Self-pay | Admitting: Pulmonary Disease

## 2011-04-29 NOTE — Telephone Encounter (Signed)
Dg Chest 2 View  04/28/2011  *RADIOLOGY REPORT*  Clinical Data: Asthma, increased cough.  Shortness of breath.  CHEST - 2 VIEW  Comparison: 10/21/2008  Findings: Rounded density is noted posteriorly in the left lung base.  On prior CT from 2009, this was shown to represent a posterior diaphragmatic hernia containing fat.  Chronic densities in the lung bases, likely scarring.  Biapical scarring.  No acute opacities or effusions.  No change since prior study.  Heart is normal size.  IMPRESSION: Stable chronic changes.  No active disease.  Original Report Authenticated By: Cyndie Chime, M.D.   CXR reviewed.  Will have my nurse inform pt that CXR does not show signs of pneumonia.  No change to current treatment plan.  Will forward to Dr. Marchelle Gearing for his review.

## 2011-04-29 NOTE — Telephone Encounter (Signed)
lmomtcb  

## 2011-05-02 ENCOUNTER — Telehealth: Payer: Self-pay | Admitting: Internal Medicine

## 2011-05-02 NOTE — Telephone Encounter (Signed)
I spoke with patient about results and she verbalized understanding and had no questions. Pt states she is still not feeling well. Pt c/o increase SOB , wheezing, cough w/ yellow to brown phlem, feels exhausted. Pt states she is still on the prednisone taper and thinks it needs to be extended. Please advise Dr. Craige Cotta, thanks  Allergies  Allergen Reactions  . Beclomethasone Dipropionate     REACTION: hives and weight gain  . Dulera     REACTION: weight gain and hives  . Sulfonamide Derivatives     REACTION: Rash  . Symbicort     REACTION: gave patient hives    Carver Fila, CMA

## 2011-05-02 NOTE — Telephone Encounter (Signed)
Faxed form received for refills on Prednisone 5 mg.  I don't see where this patient takes this on a daily basis. Form placed in MR look at for review.

## 2011-05-02 NOTE — Telephone Encounter (Signed)
Will defer decision to Dr. Marchelle Gearing.  Will forward to Dr. Marchelle Gearing for his review.

## 2011-05-03 ENCOUNTER — Telehealth: Payer: Self-pay | Admitting: Internal Medicine

## 2011-05-03 DIAGNOSIS — J45901 Unspecified asthma with (acute) exacerbation: Secondary | ICD-10-CM

## 2011-05-03 MED ORDER — PREDNISONE 10 MG PO TABS: ORAL_TABLET | ORAL | Status: DC

## 2011-05-03 NOTE — Telephone Encounter (Signed)
Just as I thought  She can do prednisone 30mg  daily x 1 more day and then 20mg  daily x 3 days, then 10mg  daily x 3 days and then continue at baseline dose. Let me know if she thinks this is suitable for her

## 2011-05-03 NOTE — Telephone Encounter (Signed)
Yes patient is on daily prednisone. She often will tell you how she wants her pred burst. Can you tell me a) what the pred burst schedule that Dr Craige Cotta wrote for her with start date and b) find out from patient how she normally does her pred burst and if she wants it stronger or longer this time  Thanks  MR

## 2011-05-03 NOTE — Telephone Encounter (Signed)
See other phone note

## 2011-05-03 NOTE — Telephone Encounter (Signed)
VS started her on 04/28/11- pred 40 x 2 days, 30 x 2 days, 20 x 2 days, 10 x 2 days. Spoke with pt and she states that she did not take this as directed- instead she took 40 mg x 3 days and she is now on her third day of the 30 mg. She states that this way usually works better for her, but now she is going to run out of prednisone too soon. Please advise recs thanks!

## 2011-05-03 NOTE — Telephone Encounter (Signed)
Pt is aware of tapering instructions for Prednisone. A new rx has been sent to her pharmacy and she will call if there are any other questions or problems. She will keep Ov with MR on 06/01/11/ @ 12pm.    Carver Fila, CMA 05/03/2011 12:09 PM Signed  lmomtcb for pt RAMASWAMY,MURALI, MD 05/03/2011 11:58 AM Signed  Just as I thought  She can do prednisone 30mg  daily x1 more day, then 20mg  daily x 3 days, then 10mg  daily x 3 days and then continue at baseline dose.  Please check with her if this is suitable and strong enough for her

## 2011-05-03 NOTE — Telephone Encounter (Signed)
lmomtcb for pt 

## 2011-05-03 NOTE — Telephone Encounter (Signed)
Just as I thought  She can do prednisone 30mg  daily x1 more day, then 20mg  daily x 3 days, then 10mg  daily x 3 days and then continue at baseline dose.   Please check with her if this is suitable and strong enough for her

## 2011-05-09 ENCOUNTER — Other Ambulatory Visit: Payer: Self-pay | Admitting: *Deleted

## 2011-05-09 MED ORDER — PREDNISONE 10 MG PO TABS: ORAL_TABLET | ORAL | Status: DC

## 2011-05-16 ENCOUNTER — Ambulatory Visit: Payer: Managed Care, Other (non HMO) | Admitting: Internal Medicine

## 2011-05-30 ENCOUNTER — Ambulatory Visit: Payer: Managed Care, Other (non HMO) | Admitting: Internal Medicine

## 2011-06-01 ENCOUNTER — Other Ambulatory Visit (INDEPENDENT_AMBULATORY_CARE_PROVIDER_SITE_OTHER): Payer: Managed Care, Other (non HMO)

## 2011-06-01 ENCOUNTER — Ambulatory Visit (INDEPENDENT_AMBULATORY_CARE_PROVIDER_SITE_OTHER): Payer: Managed Care, Other (non HMO) | Admitting: Internal Medicine

## 2011-06-01 ENCOUNTER — Encounter: Payer: Self-pay | Admitting: Internal Medicine

## 2011-06-01 DIAGNOSIS — J45909 Unspecified asthma, uncomplicated: Secondary | ICD-10-CM

## 2011-06-01 DIAGNOSIS — R0789 Other chest pain: Secondary | ICD-10-CM | POA: Insufficient documentation

## 2011-06-01 DIAGNOSIS — J441 Chronic obstructive pulmonary disease with (acute) exacerbation: Secondary | ICD-10-CM | POA: Insufficient documentation

## 2011-06-01 DIAGNOSIS — R05 Cough: Secondary | ICD-10-CM

## 2011-06-01 DIAGNOSIS — B4481 Allergic bronchopulmonary aspergillosis: Secondary | ICD-10-CM

## 2011-06-01 LAB — CBC WITH DIFFERENTIAL/PLATELET
Basophils Absolute: 0.1 10*3/uL (ref 0.0–0.1)
Eosinophils Absolute: 0.4 10*3/uL (ref 0.0–0.7)
Hemoglobin: 14 g/dL (ref 12.0–15.0)
Lymphocytes Relative: 14.3 % (ref 12.0–46.0)
MCHC: 33.7 g/dL (ref 30.0–36.0)
Monocytes Relative: 4.8 % (ref 3.0–12.0)
Neutro Abs: 5.4 10*3/uL (ref 1.4–7.7)
Platelets: 307 10*3/uL (ref 150.0–400.0)
RDW: 13.6 % (ref 11.5–14.6)

## 2011-06-01 MED ORDER — ACLIDINIUM BROMIDE 400 MCG/ACT IN AEPB: 1.0000 | INHALATION_SPRAY | Freq: Two times a day (BID) | RESPIRATORY_TRACT | Status: DC

## 2011-06-01 NOTE — Assessment & Plan Note (Signed)
Sounds related to gerd. Will forward to PMD. Advised her to contact PMD if this recurs. She is recollecting negative stress test few years ago

## 2011-06-01 NOTE — Assessment & Plan Note (Signed)
Suspect this component is independent of asthma and she is denying pnd, gerd. Suspect post viral reactive cough; will monitor for now

## 2011-06-01 NOTE — Patient Instructions (Addendum)
#  cough  - current cough appears to be post viral because your lung function is around60% and stable level for you  - let us watch it, if it gets worse or not better call us or visit soon #chest pain - suspect due to gerd  - if recurs or bothersome, call your primary care doctor #Asthma and ABPA  - continue current medication regimen except we will try TUDORZA since you are having issues with spiriva capsule quality  - let us check IgE and CBCD blood work today - depending on results we can revisit allergy or ABPA or xolair question again #Followup -  8 weeks

## 2011-06-01 NOTE — Assessment & Plan Note (Signed)
spirometryy is baseline. She is having issues with spiriva capsule quality. tHough no data she is willing to try TUDORZA  - gave 1 month sample. She will call back with response. Will check igE and CBCCD to consider xolair question again and rview why she felt she failed it in 2004. She is agreeable with plan

## 2011-06-01 NOTE — Assessment & Plan Note (Signed)
See asthma section.

## 2011-06-01 NOTE — Progress Notes (Signed)
  Subjective:    Patient ID: Victoria [name], female    DOB: Aug 10, 1955, 55 y.o.   MRN: [id number]  HPI Victoria [name] is a 55 y.o. female severe persistent asthma and ABPA.    OV 06/01/2011 Folloowup for above. Saw Dr Craige Cotta on acute basis for [facility]. Had barking cough too at that time. Given pred and antibiotic burst. Now improved but says residual cough lingers. RSI cough score is 12 (level 4 - annoying cough. Level 2 - hoarsenss, difficutlty with foods, and cough after lying down. Level 1 - sensation of lump in throat, and heartburn). Last night choked on food and after that severe heartburn related chest pain that took a while to subside. Reports negative stress test few years ago but is worrid this could be heart related.   Currently on spiriva, prednisone 5mg , singulair, asmanex. However, notes that spiriva capsules are of poor quality and 10% of them are duds. She is interested in trying out new TUDORZA though there is no data on it.   Today 05/31/11 - fev2 1.4L/56%, Ratio 60 which is c/w her stable state  Past, Social, Family: no change  Review of Systems  Constitutional: Negative for fever and unexpected weight change.  HENT: Negative for ear pain, nosebleeds, congestion, sore throat, rhinorrhea, sneezing, trouble swallowing, dental problem, postnasal drip and sinus pressure.   Eyes: Negative for redness and itching.  Respiratory: Positive for cough, chest tightness and wheezing. Negative for shortness of breath.   Cardiovascular: Negative for palpitations and leg swelling.  Gastrointestinal: Negative for nausea and vomiting.  Genitourinary: Negative for dysuria.  Musculoskeletal: Negative for joint swelling.  Skin: Negative for rash.  Neurological: Negative for headaches.  Hematological: Does not bruise/bleed easily.  Psychiatric/Behavioral: Negative for dysphoric mood. The patient is not nervous/anxious.        Objective:   Physical Exam General - Obese HEENT - no sinus  tenderness, no oral exudate, no LAN, TM clear Cardiac - s1s2 regular, no murmur Chest - prolonged exhalation, NO WHEEZE )(was wheezing last time) no rales/dullness Abdomen - soft, nontender Extremities - no e/c/c Skin - no rashes Neurologic - normal strength, CN intact Psychiatric - normal mood, behavior       Assessment & Plan:

## 2011-06-02 LAB — IGE: IgE (Immunoglobulin E), Serum: 1384.1 IU/mL — ABNORMAL HIGH (ref 0.0–180.0)

## 2011-06-07 ENCOUNTER — Telehealth: Payer: Self-pay | Admitting: *Deleted

## 2011-06-07 MED ORDER — ALBUTEROL SULFATE (2.5 MG/3ML) 0.083% IN NEBU: 2.5000 mg | INHALATION_SOLUTION | Freq: Four times a day (QID) | RESPIRATORY_TRACT | Status: DC | PRN

## 2011-06-07 MED ORDER — AMBULATORY NON FORMULARY MEDICATION: Status: DC

## 2011-06-07 NOTE — Telephone Encounter (Signed)
I spoke with the pt and she states there were several issues back n 2004 that xolair failed, mostly it was financial issues at that time. She states she would be willing to try xolair again.  The pt also asked for a refill on albuterol neb solution as well as an rx for portable nebulizer to take with her when she travels. This has been done. Pt is aware. Old chart ordered. Carron Curie, CMA

## 2011-06-07 NOTE — Telephone Encounter (Signed)
Ok I wil review old chart but I realize since I sent you message that Victoria [name] is not indicated if IgE > 700. But I will review old chart and get back to patient. I believe I told patient she might not qualify but I will review paper chart

## 2011-06-07 NOTE — Telephone Encounter (Signed)
Message copied by Darrell Jewel on Tue Jun 07, 2011  2:06 PM ------      Message from: Kalman Shan      Created: Thu Jun 02, 2011  6:46 AM       [name], please let patient know that IgE level is extremely high at 1300. I would like to know why she sought she failed Xolair in 2004. She could not recollect when I asked her yesterday. Therefore, please get hold off paper chart for my review. Place the patient that once her finish review in several weeks I will call her. I will also discuss this more at followup

## 2011-06-27 ENCOUNTER — Other Ambulatory Visit: Payer: Self-pay | Admitting: Internal Medicine

## 2011-06-27 MED ORDER — OMEPRAZOLE-SODIUM BICARBONATE 20-1100 MG PO CAPS: 1.0000 | ORAL_CAPSULE | ORAL | Status: DC

## 2011-06-27 NOTE — Telephone Encounter (Signed)
RX sent

## 2011-06-29 ENCOUNTER — Inpatient Hospital Stay (HOSPITAL_COMMUNITY)
Admission: EM | Admit: 2011-06-29 | Discharge: 2011-07-04 | DRG: 195 | Disposition: A | Discharge status: Home / Self Care | Payer: Managed Care, Other (non HMO) | Attending: Pulmonary Disease | Admitting: Pulmonary Disease

## 2011-06-29 ENCOUNTER — Emergency Department (HOSPITAL_COMMUNITY): Payer: Managed Care, Other (non HMO)

## 2011-06-29 ENCOUNTER — Encounter (HOSPITAL_COMMUNITY): Payer: Self-pay

## 2011-06-29 ENCOUNTER — Telehealth: Payer: Self-pay | Admitting: Internal Medicine

## 2011-06-29 DIAGNOSIS — E669 Obesity, unspecified: Secondary | ICD-10-CM | POA: Diagnosis present

## 2011-06-29 DIAGNOSIS — R82998 Other abnormal findings in urine: Secondary | ICD-10-CM | POA: Diagnosis present

## 2011-06-29 DIAGNOSIS — R079 Chest pain, unspecified: Secondary | ICD-10-CM | POA: Diagnosis not present

## 2011-06-29 DIAGNOSIS — R0902 Hypoxemia: Secondary | ICD-10-CM

## 2011-06-29 DIAGNOSIS — M810 Age-related osteoporosis without current pathological fracture: Secondary | ICD-10-CM | POA: Diagnosis present

## 2011-06-29 DIAGNOSIS — G4700 Insomnia, unspecified: Secondary | ICD-10-CM | POA: Diagnosis not present

## 2011-06-29 DIAGNOSIS — E785 Hyperlipidemia, unspecified: Secondary | ICD-10-CM | POA: Diagnosis present

## 2011-06-29 DIAGNOSIS — I959 Hypotension, unspecified: Secondary | ICD-10-CM | POA: Diagnosis not present

## 2011-06-29 DIAGNOSIS — E876 Hypokalemia: Secondary | ICD-10-CM | POA: Diagnosis not present

## 2011-06-29 DIAGNOSIS — F341 Dysthymic disorder: Secondary | ICD-10-CM | POA: Diagnosis present

## 2011-06-29 DIAGNOSIS — B4481 Allergic bronchopulmonary aspergillosis: Secondary | ICD-10-CM | POA: Diagnosis present

## 2011-06-29 DIAGNOSIS — Z6830 Body mass index (BMI) 30.0-30.9, adult: Secondary | ICD-10-CM | POA: Diagnosis not present

## 2011-06-29 DIAGNOSIS — R7309 Other abnormal glucose: Secondary | ICD-10-CM | POA: Diagnosis not present

## 2011-06-29 DIAGNOSIS — Z7982 Long term (current) use of aspirin: Secondary | ICD-10-CM | POA: Diagnosis not present

## 2011-06-29 DIAGNOSIS — R911 Solitary pulmonary nodule: Secondary | ICD-10-CM | POA: Diagnosis present

## 2011-06-29 DIAGNOSIS — J4489 Other specified chronic obstructive pulmonary disease: Secondary | ICD-10-CM | POA: Diagnosis present

## 2011-06-29 DIAGNOSIS — M1990 Unspecified osteoarthritis, unspecified site: Secondary | ICD-10-CM | POA: Diagnosis present

## 2011-06-29 DIAGNOSIS — F329 Major depressive disorder, single episode, unspecified: Secondary | ICD-10-CM | POA: Diagnosis present

## 2011-06-29 DIAGNOSIS — G4733 Obstructive sleep apnea (adult) (pediatric): Secondary | ICD-10-CM | POA: Diagnosis present

## 2011-06-29 DIAGNOSIS — T380X5A Adverse effect of glucocorticoids and synthetic analogues, initial encounter: Secondary | ICD-10-CM | POA: Diagnosis not present

## 2011-06-29 DIAGNOSIS — F418 Other specified anxiety disorders: Secondary | ICD-10-CM | POA: Diagnosis present

## 2011-06-29 DIAGNOSIS — J45909 Unspecified asthma, uncomplicated: Secondary | ICD-10-CM

## 2011-06-29 DIAGNOSIS — J449 Chronic obstructive pulmonary disease, unspecified: Secondary | ICD-10-CM | POA: Diagnosis present

## 2011-06-29 DIAGNOSIS — J189 Pneumonia, unspecified organism: Secondary | ICD-10-CM | POA: Diagnosis present

## 2011-06-29 DIAGNOSIS — E782 Mixed hyperlipidemia: Secondary | ICD-10-CM | POA: Diagnosis present

## 2011-06-29 DIAGNOSIS — K589 Irritable bowel syndrome without diarrhea: Secondary | ICD-10-CM | POA: Diagnosis present

## 2011-06-29 LAB — URINALYSIS, ROUTINE W REFLEX MICROSCOPIC
Bilirubin Urine: NEGATIVE
Ketones, ur: NEGATIVE mg/dL
Nitrite: NEGATIVE
Protein, ur: NEGATIVE mg/dL
Specific Gravity, Urine: 1.021 (ref 1.005–1.030)
Urobilinogen, UA: 1 mg/dL (ref 0.0–1.0)

## 2011-06-29 LAB — DIFFERENTIAL
Eosinophils Relative: 1 % (ref 0–5)
Lymphocytes Relative: 4 % — ABNORMAL LOW (ref 12–46)
Lymphs Abs: 0.7 10*3/uL (ref 0.7–4.0)
Monocytes Absolute: 1 10*3/uL (ref 0.1–1.0)
Neutro Abs: 16.5 10*3/uL — ABNORMAL HIGH (ref 1.7–7.7)

## 2011-06-29 LAB — URINE MICROSCOPIC-ADD ON

## 2011-06-29 LAB — URINE CULTURE

## 2011-06-29 LAB — CBC
HCT: 39.5 % (ref 36.0–46.0)
HCT: 39.2 % (ref 36.0–46.0)
Hemoglobin: 13.2 g/dL (ref 12.0–15.0)
MCH: 30.4 pg (ref 26.0–34.0)
MCHC: 33.7 g/dL (ref 30.0–36.0)
MCV: 89.8 fL (ref 78.0–100.0)
MCV: 90.3 fL (ref 78.0–100.0)
Platelets: 220 10*3/uL (ref 150–400)
RBC: 4.4 MIL/uL (ref 3.87–5.11)
WBC: 18.3 10*3/uL — ABNORMAL HIGH (ref 4.0–10.5)

## 2011-06-29 LAB — COMPREHENSIVE METABOLIC PANEL
ALT: 40 U/L — ABNORMAL HIGH (ref 0–35)
CO2: 23 mEq/L (ref 19–32)
Calcium: 9.2 mg/dL (ref 8.4–10.5)
Chloride: 101 mEq/L (ref 96–112)
GFR calc Af Amer: >90 mL/min (ref >90)
GFR calc non Af Amer: >90 mL/min (ref >90)
Glucose, Bld: 101 mg/dL — ABNORMAL HIGH (ref 70–99)
Sodium: 137 mEq/L (ref 135–145)
Total Bilirubin: 0.8 mg/dL (ref 0.3–1.2)

## 2011-06-29 LAB — CREATININE, SERUM: Creatinine, Ser: 0.72 mg/dL (ref 0.50–1.10)

## 2011-06-29 MED ORDER — ACETAMINOPHEN 650 MG RE SUPP: 650.0000 mg | Freq: Four times a day (QID) | RECTAL | Status: DC | PRN

## 2011-06-29 MED ORDER — ACETAMINOPHEN 325 MG PO TABS
650.0000 mg | ORAL_TABLET | Freq: Four times a day (QID) | ORAL | Status: DC | PRN
  Administered 2011-06-30: 650 mg via ORAL
  Filled 2011-06-29: qty 2

## 2011-06-29 MED ORDER — PIPERACILLIN-TAZOBACTAM 3.375 G IVPB
3.3750 g | Freq: Three times a day (TID) | INTRAVENOUS | Status: DC
  Administered 2011-06-29 – 2011-07-02 (×8): 3.375 g via INTRAVENOUS
  Filled 2011-06-29 (×11): qty 50

## 2011-06-29 MED ORDER — SODIUM CHLORIDE 0.9 % IV SOLN
INTRAVENOUS | Status: DC
  Administered 2011-06-29: 23:00:00 via INTRAVENOUS

## 2011-06-29 MED ORDER — HYDROCOD POLST-CHLORPHEN POLST 10-8 MG/5ML PO LQCR
5.0000 mL | Freq: Every evening | ORAL | Status: DC | PRN
  Administered 2011-07-02: 5 mL via ORAL
  Filled 2011-06-29: qty 5

## 2011-06-29 MED ORDER — MONTELUKAST SODIUM 10 MG PO TABS
10.0000 mg | ORAL_TABLET | Freq: Two times a day (BID) | ORAL | Status: DC
  Administered 2011-06-29 – 2011-07-04 (×10): 10 mg via ORAL
  Filled 2011-06-29 (×12): qty 1

## 2011-06-29 MED ORDER — ASPIRIN 81 MG PO TABS: 81.0000 mg | ORAL_TABLET | Freq: Every day | ORAL | Status: DC

## 2011-06-29 MED ORDER — VANCOMYCIN HCL 1000 MG IV SOLR
1250.0000 mg | Freq: Once | INTRAVENOUS | Status: AC
  Administered 2011-06-29: 1250 mg via INTRAVENOUS
  Filled 2011-06-29 (×2): qty 1250

## 2011-06-29 MED ORDER — HEPARIN SODIUM (PORCINE) 5000 UNIT/ML IJ SOLN
5000.0000 [IU] | Freq: Three times a day (TID) | INTRAMUSCULAR | Status: DC
  Administered 2011-06-29 – 2011-07-04 (×14): 5000 [IU] via SUBCUTANEOUS
  Filled 2011-06-29 (×18): qty 1

## 2011-06-29 MED ORDER — ALBUTEROL SULFATE (5 MG/ML) 0.5% IN NEBU: 2.5000 mg | INHALATION_SOLUTION | RESPIRATORY_TRACT | Status: DC | PRN

## 2011-06-29 MED ORDER — PREDNISONE 5 MG PO TABS
5.0000 mg | ORAL_TABLET | Freq: Every day | ORAL | Status: DC
  Filled 2011-06-29 (×2): qty 1

## 2011-06-29 MED ORDER — VANCOMYCIN HCL IN DEXTROSE 1-5 GM/200ML-% IV SOLN
1000.0000 mg | Freq: Two times a day (BID) | INTRAVENOUS | Status: DC
  Administered 2011-06-30 – 2011-07-01 (×4): 1000 mg via INTRAVENOUS
  Filled 2011-06-29 (×7): qty 200

## 2011-06-29 MED ORDER — PANTOPRAZOLE SODIUM 40 MG PO TBEC
40.0000 mg | DELAYED_RELEASE_TABLET | Freq: Every day | ORAL | Status: DC
  Administered 2011-06-29 – 2011-07-03 (×5): 40 mg via ORAL
  Filled 2011-06-29 (×3): qty 1

## 2011-06-29 MED ORDER — VENLAFAXINE HCL 37.5 MG PO TABS
37.5000 mg | ORAL_TABLET | Freq: Three times a day (TID) | ORAL | Status: DC
  Filled 2011-06-29 (×7): qty 1

## 2011-06-29 MED ORDER — TIOTROPIUM BROMIDE MONOHYDRATE 18 MCG IN CAPS
18.0000 ug | ORAL_CAPSULE | Freq: Every day | RESPIRATORY_TRACT | Status: DC
  Administered 2011-06-30 – 2011-07-04 (×3): 18 ug via RESPIRATORY_TRACT
  Filled 2011-06-29 (×2): qty 5

## 2011-06-29 MED ORDER — MOMETASONE FUROATE 220 MCG/INH IN AEPB
2.0000 | INHALATION_SPRAY | Freq: Every day | RESPIRATORY_TRACT | Status: DC
  Administered 2011-07-02 – 2011-07-03 (×2): 2 via RESPIRATORY_TRACT
  Filled 2011-06-29: qty 1

## 2011-06-29 MED ORDER — ASPIRIN EC 81 MG PO TBEC
81.0000 mg | DELAYED_RELEASE_TABLET | Freq: Every day | ORAL | Status: DC
  Administered 2011-06-29 – 2011-07-04 (×6): 81 mg via ORAL
  Filled 2011-06-29 (×6): qty 1

## 2011-06-29 MED ORDER — VENLAFAXINE HCL 37.5 MG PO TABS: 37.5000 mg | ORAL_TABLET | Freq: Every day | ORAL | Status: DC

## 2011-06-29 MED ORDER — ACETAMINOPHEN 325 MG PO TABS
650.0000 mg | ORAL_TABLET | Freq: Once | ORAL | Status: AC
  Administered 2011-06-29: 650 mg via ORAL
  Filled 2011-06-29: qty 2

## 2011-06-29 NOTE — ED Notes (Signed)
3743-01 Ready 

## 2011-06-29 NOTE — H&P (Signed)
Patient: Victoria [name] DOB: 06-23-1955 Date of Admission: 06/29/2011 Pulmonary MD: Marchelle Gearing               Pulmonary H & P  CC - chest pain  HPI -  56yo female pt of Dr. Marchelle Gearing with hx severe asthma on chronic prednisone who reports frequent URI since October 2012 and frequent cough since that time.  Seen in pulmonary office again November 2012 and rx with Azithro and extended prednisone taper. She traveled over the past week and returned 1/6 and that evening developed fever, worsening cough, nasal congestion/drainage and malaise.  Also c/o R sided pleuritic chest pain that felt similar to a previous pneumothorax.  Reports no wheezing, no increased rescue inhaler use, SOB, n/v/d, swelling.  Presented 1/9 to ER with worsening malaise, fever and chest pain.  ED w/u revealed possible UTI and CXR with likely PNA and PCCM asked to admit.   Allergies:  Allergies  Allergen Reactions  . Beclomethasone Dipropionate     REACTION: hives and weight gain  . Dulera     REACTION: weight gain and hives  . Sulfonamide Derivatives     REACTION: Rash  . Symbicort     REACTION: gave patient hives   PMH: Past Medical History  Diagnosis Date  . Asthma   . Obesity   . Maxillary sinusitis   . Depression   . Osteoarthritis   . IBS (irritable bowel syndrome)   . Hyperlipidemia   . COPD (chronic obstructive pulmonary disease)   . Pulmonary nodules   . OSA (obstructive sleep apnea)     Mod OSA by hx in 2007.  AHI 22.5 02/2606  . Osteoporosis   . Nasal congestion 9/12    current cold  . Cough   . Wheezing    Home meds: Medications Prior to Admission  Medication Dose Route Frequency Provider Last Rate Last Dose  . acetaminophen (TYLENOL) tablet 650 mg  650 mg Oral Once Carleene Cooper III, MD       Medications Prior to Admission  Medication Sig Dispense Refill  . albuterol (PROAIR HFA) 108 (90 BASE) MCG/ACT inhaler Inhale 2 puffs into the lungs every 6 (six) hours as needed.        Marland Kitchen albuterol  (PROVENTIL) (2.5 MG/3ML) 0.083% nebulizer solution Take 3 mLs (2.5 mg total) by nebulization every 6 (six) hours as needed.  75 mL  12  . aspirin 81 MG tablet Take 81 mg by mouth daily.       . Calcium-Magnesium-Vitamin D (CALCIUM MAGNESIUM PO) Take by mouth daily. shaklee osteomatrix- contains calsium, magnesium, vitamin d, vitamin k, zinc, copper and manganese       . chlorpheniramine-HYDROcodone (TUSSIONEX PENNKINETIC ER) 10-8 MG/5ML LQCR Take 5 mLs by mouth at bedtime as needed.  120 mL  0  . Cholecalciferol (VITAMIN D) 2000 UNITS CAPS Take 2,000 Units by mouth.        . Coenzyme Q10 (COQ10 PO) Take 1 tablet by mouth daily.       . mometasone (ASMANEX) 220 MCG/INH inhaler 2 puffs in evening  3 Inhaler  3  . montelukast (SINGULAIR) 10 MG tablet Take 1 tablet (10 mg total) by mouth 2 (two) times daily.  180 tablet  4  . Omega-3 Fatty Acids (OMEGA 3 PO) Take 1 capsule by mouth daily.       Maxwell Caul Bicarbonate (ZEGERID) 20-1100 MG CAPS Take 1 capsule by mouth every morning.  30 each  5  . predniSONE (DELTASONE)  10 MG tablet Take 5 mg daily or as directed.  90 tablet  0  . venlafaxine (EFFEXOR) 37.5 MG tablet Take 37.5 mg by mouth 3 (three) times daily. 3 by mouth daily      . DISCONTD: venlafaxine (EFFEXOR) 37.5 MG tablet 3 by mouth daily  270 tablet  3  . Aclidinium Bromide (TUDORZA PRESSAIR) 400 MCG/ACT AEPB Inhale 1 puff into the lungs 2 (two) times daily.  60 each  1   Social Hx: History   Social History  . Marital Status: Married    Spouse Name: N/A    Number of Children: N/A  . Years of Education: N/A   Occupational History  . Not on file.   Social History Main Topics  . Smoking status: Never Smoker   . Smokeless tobacco: Never Used  . Alcohol Use: 2.4 oz/week    2 Glasses of wine, 2 Cans of beer per week  . Drug Use: No  . Sexually Active: Not on file   Other Topics Concern  . Not on file   Social History Narrative  . No narrative on file   Family  Hx: Family History  Problem Relation Age of Onset  . Breast cancer Mother   . Cancer Mother 20    breast that metasticized to liver  . Prostate cancer Father   . Diverticulosis Father   . Cancer Father 90    prostate    ROS: Per HPI. All other systems reviewed and were neg.   Filed Vitals:   06/29/11 1247 06/29/11 1450  BP: 117/65 92/57  Pulse: 118 114  Temp: 97.1 F (36.2 C)   TempSrc: Oral   Resp: 16   SpO2: 97% 97%   chest X-ray Dg Chest 2 View  06/29/2011  *RADIOLOGY REPORT*  Clinical Data: 56 year old female with cough and right chest pain. Fever.  CHEST - 2 VIEW  Comparison: 04/28/2011 earlier.  Findings: Stable lung volumes.  Stable eventration of the left hemidiaphragm.  New patchy bilateral perihilar opacity.  Some apical and lung base scarring is suspected and stable.  Cardiac size and mediastinal contours are within normal limits.  Visualized tracheal air column is within normal limits.  No pleural effusion or pneumothorax. No acute osseous abnormality identified.  IMPRESSION: Bilateral perihilar bronchopneumonia/pneumonia. Recommend post-treatment radiographs to document resolution.  Original Report Authenticated By: Ulla Potash III, M.D.   CBC    Component Value Date/Time   WBC 18.3* 06/29/2011 1340   RBC 4.40 06/29/2011 1340   HGB 13.2 06/29/2011 1340   HCT 39.5 06/29/2011 1340   PLT 220 06/29/2011 1340   MCV 89.8 06/29/2011 1340   MCH 30.0 06/29/2011 1340   MCHC 33.4 06/29/2011 1340   RDW 13.4 06/29/2011 1340   LYMPHSABS 0.7 06/29/2011 1340   MONOABS 1.0 06/29/2011 1340   EOSABS 0.1 06/29/2011 1340   BASOSABS 0.0 06/29/2011 1340   BMET    Component Value Date/Time   NA 137 06/29/2011 1340   K 3.4* 06/29/2011 1340   CL 101 06/29/2011 1340   CO2 23 06/29/2011 1340   GLUCOSE 101* 06/29/2011 1340   BUN 13 06/29/2011 1340   CREATININE 0.66 06/29/2011 1340   CALCIUM 9.2 06/29/2011 1340   GFRNONAA >90 06/29/2011 1340   GFRAA >90 06/29/2011 1340   EXAM: General appearance: NAD, conversant  Eyes:  anicteric sclerae, moist conjunctivae; no lid-lag; PERRLA HENT: Atraumatic; oropharynx clear with moist mucous membranes and no mucosal ulcerations; normal hard and soft palate Neck: Trachea  midline; FROM, supple, no thyromegaly or lymphadenopathy Lungs: CTA, with normal respiratory effort and no intercostal retractions CV: RRR, no MRGs  Abdomen: Soft, non-tender; no masses or HSM Extremities: No peripheral edema or extremity lymphadenopathy Skin: Normal temperature, turgor and texture; no rash, ulcers or subcutaneous nodules Psych: Appropriate affect, alert and oriented to person, place and time   IMPRESSION/ PLAN: 1. CAP - with underlying severe asthma.  Seen in office in November and rx Azithro/extended pred taper.  PLAN -  Admit to tele Abx - vanc/zosyn  F/u CXR Pulmonary hygiene O2 if needed Sputum culture  2. Asthma PLAN -  Cont maintenance asthma rx No increase in maintenance dose steroids   3. UTI PLAN -  Urine culture Abx as above   WHITEHEART,KATHRYN, NP 06/29/2011  4:23 PM  *Care during the described time interval was provided by me and/or other providers on the critical care team. I have reviewed this patient's available data, including medical history, events of note, physical examination and test results as part of my evaluation.  Patient seen and examined, agree with above note.  I dictated the care and orders written for this patient under my direction.  Koren Bound, M.D. [phone]

## 2011-06-29 NOTE — ED Notes (Signed)
Report called to [facility] RN   3700.

## 2011-06-29 NOTE — ED Notes (Signed)
Pt presents with sudden onset of R sided chest pain that began this morning at 0630.  Pt reports pain is intermittent, worsens with deep inspiration and does not radiate.  Pt reports productive cough since October, will intermittently cough up brown phlegm, pt reports is difficulty to clear mucus.  Pt reports fever that began this morning, pt reports fever of 100.7 and chills.

## 2011-06-29 NOTE — Progress Notes (Signed)
ANTIBIOTIC CONSULT NOTE - INITIAL  Pharmacy Consult for Vancomycin and Zosyn Indication: rule out pneumonia  Allergies  Allergen Reactions  . Beclomethasone Dipropionate     REACTION: hives and weight gain  . Dulera     REACTION: weight gain and hives  . Sulfonamide Derivatives     REACTION: Rash  . Symbicort     REACTION: gave patient hives    Patient Measurements: Weight 77.1 kg from 06/01/11 Height 5'3" from 06/01/11  Vital Signs: Temp: 97.4 F (36.3 C) (01/09 2015) Temp src: Oral (01/09 2015) BP: 110/74 mmHg (01/09 2015) Pulse Rate: 115  (01/09 2015) Intake/Output from previous day:   Intake/Output from this shift:    Labs:  Basename 06/29/11 1340  WBC 18.3*  HGB 13.2  PLT 220  LABCREA --  CREATININE 0.66   The CrCl is unknown because both a height and weight (above a minimum accepted value) are required for this calculation. No results found for this basename: VANCOTROUGH:2,VANCOPEAK:2,VANCORANDOM:2,GENTTROUGH:2,GENTPEAK:2,GENTRANDOM:2,TOBRATROUGH:2,TOBRAPEAK:2,TOBRARND:2,AMIKACINPEAK:2,AMIKACINTROU:2,AMIKACIN:2, in the last 72 hours   Microbiology: No results found for this or any previous visit (from the past 720 hour(s)).  Medical History: Past Medical History  Diagnosis Date  . Asthma   . Obesity   . Maxillary sinusitis   . Depression   . Osteoarthritis   . IBS (irritable bowel syndrome)   . Hyperlipidemia   . COPD (chronic obstructive pulmonary disease)   . Pulmonary nodules   . OSA (obstructive sleep apnea)     Mod OSA by hx in 2007.  AHI 22.5 02/2606  . Osteoporosis   . Nasal congestion 9/12    current cold  . Cough   . Wheezing     Medications:  Scheduled:    . acetaminophen  650 mg Oral Once   Infusions:   Anti-infectives    None     Assessment: 56 year old female with history of severe asthma on chronic prednisone and s/p azithromycin now with fever, worsening cough and congestion to start Vancomycin and Zosyn for suspected  pneumonia. WBC elevated at 18.3, SCr 0.66/estCrCl ~87ml/min, sputum and urine culture pending.   Goal of Therapy:  Vancomycin trough level 15-20 mcg/ml  Plan:  1. Zosyn 3.375g IV q8h. 2. Vancomycin 1250mg  IV x1, then 1g IV q12h.  3. Follow-up renal function, cultures, and troughs as needed.   Link Snuffer, PharmD, BCPS 06/29/2011,8:49 PM

## 2011-06-29 NOTE — ED Provider Notes (Signed)
History     CSN: [id number]  Arrival date & time 06/29/11  1234   First MD Initiated Contact with Patient 06/29/11 1308      Chief Complaint  Patient presents with  . Chest Pain    (Consider location/radiation/quality/duration/timing/severity/associated sxs/prior treatment) HPI Comments: Pt is a 56 year old woman with a history of asthma.  She has had several recent bouts of respiratory infections requiring treatment with antibiotics and prednisone.  Last night she went to bed with chills.  This morning she felt a right pleuritic chest pain, particularly when she would cough, and had fever to 100.7.  She called her pulmonologist, Dr. Marchelle Gearing; his nurse advised her to go to the ED for evaluation.    Patient is a 56 y.o. female presenting with cough. The history is provided by the patient. No language interpreter was used.  Cough This is a new problem. The current episode started yesterday. The problem occurs every few minutes. The problem has been gradually worsening. The cough is non-productive. The maximum temperature recorded prior to her arrival was 100 to 100.9 F. The fever has been present for less than 1 day. Associated symptoms include chest pain, chills and sweats. Pertinent negatives include no wheezing. Treatments tried: She used her inhalers without relief. Her past medical history is significant for pneumonia and asthma.    Past Medical History  Diagnosis Date  . Asthma   . Obesity   . Maxillary sinusitis   . Depression   . Osteoarthritis   . IBS (irritable bowel syndrome)   . Hyperlipidemia   . COPD (chronic obstructive pulmonary disease)   . Pulmonary nodules   . OSA (obstructive sleep apnea)     Mod OSA by hx in 2007.  AHI 22.5 02/2606  . Osteoporosis   . Nasal congestion 9/12    current cold  . Cough   . Wheezing     Past Surgical History  Procedure Date  . Appendectomy   . Cesarean section     Family History  Problem Relation Age of Onset  . Breast  cancer Mother   . Cancer Mother 59    breast that metasticized to liver  . Prostate cancer Father   . Diverticulosis Father   . Cancer Father 74    prostate     History  Substance Use Topics  . Smoking status: Never Smoker   . Smokeless tobacco: Never Used  . Alcohol Use: 2.4 oz/week    2 Glasses of wine, 2 Cans of beer per week    OB History    Grav Para Term Preterm Abortions TAB SAB Ect Mult Living                  Review of Systems  Constitutional: Positive for fever and chills.  HENT: Negative.   Eyes: Negative.   Respiratory: Positive for cough. Negative for wheezing.   Cardiovascular: Positive for chest pain.       Right anterior pleuritic chest pain.  Gastrointestinal: Negative.   Genitourinary: Negative.   Musculoskeletal: Negative.   Skin: Negative.   Neurological: Negative.   Psychiatric/Behavioral: Negative.     Allergies  Beclomethasone dipropionate; Dulera; Sulfonamide derivatives; and Symbicort  Home Medications   Current Outpatient Rx  Name Route Sig Dispense Refill  . ALBUTEROL SULFATE HFA 108 (90 BASE) MCG/ACT IN AERS Inhalation Inhale 2 puffs into the lungs every 6 (six) hours as needed.      . ALBUTEROL SULFATE (2.5 MG/3ML) 0.083%  IN NEBU Nebulization Take 3 mLs (2.5 mg total) by nebulization every 6 (six) hours as needed. 75 mL 12  . ASPIRIN 81 MG PO TABS Oral Take 81 mg by mouth daily.     Marland Kitchen CALCIUM MAGNESIUM PO Oral Take by mouth daily. shaklee osteomatrix- contains calsium, magnesium, vitamin d, vitamin k, zinc, copper and manganese     . HYDROCOD POLST-CPM POLST ER 10-8 MG/5ML PO LQCR Oral Take 5 mLs by mouth at bedtime as needed. 120 mL 0  . VITAMIN D 2000 UNITS PO CAPS Oral Take 2,000 Units by mouth.      . COQ10 PO Oral Take 1 tablet by mouth daily.     . MOMETASONE FUROATE 220 MCG/INH IN AEPB  2 puffs in evening 3 Inhaler 3  . MONTELUKAST SODIUM 10 MG PO TABS Oral Take 1 tablet (10 mg total) by mouth 2 (two) times daily. 180 tablet 4    . OMEGA 3 PO Oral Take 1 capsule by mouth daily.     Marland Kitchen OMEPRAZOLE-SODIUM BICARBONATE 20-1100 MG PO CAPS Oral Take 1 capsule by mouth every morning. 30 each 5  . OVER THE COUNTER MEDICATION Oral Take 1 tablet by mouth daily. Shaklee Immunity    . PREDNISONE 10 MG PO TABS  Take 5 mg daily or as directed. 90 tablet 0  . TIOTROPIUM BROMIDE MONOHYDRATE 18 MCG IN CAPS Inhalation Place 18 mcg into inhaler and inhale daily.    . VENLAFAXINE HCL 37.5 MG PO TABS Oral Take 37.5 mg by mouth 3 (three) times daily. 3 by mouth daily    . ACLIDINIUM BROMIDE 400 MCG/ACT IN AEPB Inhalation Inhale 1 puff into the lungs 2 (two) times daily. 60 each 1    Rinse, gargle, and spit after use    BP 92/57  Pulse 114  Temp(Src) 97.1 F (36.2 C) (Oral)  Resp 16  SpO2 97%  Physical Exam  Constitutional: She is oriented to person, place, and time. She appears well-developed and well-nourished.       In mild distress with right anterior pleuritic chest pain.  HENT:  Head: Normocephalic and atraumatic.  Right Ear: External ear normal.  Left Ear: External ear normal.  Eyes: Conjunctivae and EOM are normal. Pupils are equal, round, and reactive to light.  Neck: Normal range of motion. Neck supple.  Cardiovascular: Normal rate, regular rhythm and normal heart sounds.   Pulmonary/Chest:       Distant breath sounds.  No wheezes, rales or rhonchi.  Abdominal: Soft. Bowel sounds are normal. There is no tenderness.  Musculoskeletal: Normal range of motion.  Neurological: She is alert and oriented to person, place, and time.       No sensory or motor deficit.  Skin: Skin is warm and dry.  Psychiatric: She has a normal mood and affect. Her behavior is normal.    ED Course  Procedures (including critical care time)  Labs Reviewed  CBC - Abnormal; Notable for the following:    WBC 18.3 (*)    All other components within normal limits  DIFFERENTIAL - Abnormal; Notable for the following:    Neutrophils Relative 90  (*)    Neutro Abs 16.5 (*)    Lymphocytes Relative 4 (*)    All other components within normal limits  COMPREHENSIVE METABOLIC PANEL - Abnormal; Notable for the following:    Potassium 3.4 (*)    Glucose, Bld 101 (*)    Albumin 3.3 (*)    ALT 40 (*)  All other components within normal limits  URINALYSIS, ROUTINE W REFLEX MICROSCOPIC - Abnormal; Notable for the following:    Leukocytes, UA SMALL (*)    All other components within normal limits  URINE MICROSCOPIC-ADD ON  URINE CULTURE   Dg Chest 2 View  06/29/2011  *RADIOLOGY REPORT*  Clinical Data: 56 year old female with cough and right chest pain. Fever.  CHEST - 2 VIEW  Comparison: 04/28/2011 earlier.  Findings: Stable lung volumes.  Stable eventration of the left hemidiaphragm.  New patchy bilateral perihilar opacity.  Some apical and lung base scarring is suspected and stable.  Cardiac size and mediastinal contours are within normal limits.  Visualized tracheal air column is within normal limits.  No pleural effusion or pneumothorax. No acute osseous abnormality identified.  IMPRESSION: Bilateral perihilar bronchopneumonia/pneumonia. Recommend post-treatment radiographs to document resolution.  Original Report Authenticated By: Harley Hallmark, M.D.   4:13 PM Lab workup showed elevated WBC and a chest x-ray showing bilateral hilar pneumonia. Case discussed with Dr. Katina Degree.  PCCM will see and admit pt.    No diagnosis found.         Carleene Cooper III, MD 06/29/11 [phone]

## 2011-06-29 NOTE — ED Notes (Signed)
Report  called to RN  Ramona  On 2900

## 2011-06-29 NOTE — ED Notes (Signed)
Dr Meda Klinefelter called wants room changed to tele not ICU  Room cancelled and  New tele request done.

## 2011-06-29 NOTE — ED Notes (Signed)
2910-01 Ready

## 2011-06-29 NOTE — ED Notes (Signed)
Received report assumed patient care , walking rounds completed.  Patient resting.

## 2011-06-29 NOTE — Telephone Encounter (Addendum)
Pt acknowledged calling 911 & stated she would do that now & that nothing further was needed.  Due to this, I am closing this encounter.  Victoria [name]

## 2011-06-30 DIAGNOSIS — J189 Pneumonia, unspecified organism: Secondary | ICD-10-CM

## 2011-06-30 DIAGNOSIS — I959 Hypotension, unspecified: Secondary | ICD-10-CM

## 2011-06-30 DIAGNOSIS — J45909 Unspecified asthma, uncomplicated: Secondary | ICD-10-CM

## 2011-06-30 LAB — CBC
MCHC: 33.3 g/dL (ref 30.0–36.0)
Platelets: 227 10*3/uL (ref 150–400)
RDW: 13.6 % (ref 11.5–15.5)
WBC: 19.3 10*3/uL — ABNORMAL HIGH (ref 4.0–10.5)

## 2011-06-30 LAB — INFLUENZA PANEL BY PCR (TYPE A & B)
H1N1 flu by pcr: NOT DETECTED
Influenza B By PCR: NEGATIVE

## 2011-06-30 LAB — URINE CULTURE
Colony Count: NO GROWTH
Culture  Setup Time: 201301101358
Culture: NO GROWTH

## 2011-06-30 LAB — BASIC METABOLIC PANEL
BUN: 11 mg/dL (ref 6–23)
Calcium: 9.1 mg/dL (ref 8.4–10.5)
Creatinine, Ser: 0.68 mg/dL (ref 0.50–1.10)
GFR calc Af Amer: >90 mL/min (ref >90)
GFR calc non Af Amer: >90 mL/min (ref >90)

## 2011-06-30 LAB — CULTURE, BLOOD (ROUTINE X 2)
Culture  Setup Time: 201301101408
Culture: NO GROWTH

## 2011-06-30 LAB — PHOSPHORUS: Phosphorus: 1.9 mg/dL — ABNORMAL LOW (ref 2.3–4.6)

## 2011-06-30 LAB — SEDIMENTATION RATE: Sed Rate: 57 mm/hr — ABNORMAL HIGH (ref 0–22)

## 2011-06-30 LAB — LACTIC ACID, PLASMA: Lactic Acid, Venous: 1.4 mmol/L (ref 0.5–2.2)

## 2011-06-30 LAB — LEGIONELLA ANTIGEN, URINE: Legionella Antigen, Urine: NEGATIVE

## 2011-06-30 LAB — PROCALCITONIN: Procalcitonin: 0.8 ng/mL

## 2011-06-30 MED ORDER — AZITHROMYCIN 500 MG IV SOLR
500.0000 mg | INTRAVENOUS | Status: DC
  Administered 2011-06-30 – 2011-07-01 (×2): 500 mg via INTRAVENOUS
  Filled 2011-06-30 (×3): qty 500

## 2011-06-30 MED ORDER — OSELTAMIVIR PHOSPHATE 75 MG PO CAPS
75.0000 mg | ORAL_CAPSULE | Freq: Two times a day (BID) | ORAL | Status: DC
  Filled 2011-06-30 (×2): qty 1

## 2011-06-30 MED ORDER — SODIUM CHLORIDE 0.9 % IV BOLUS (SEPSIS)
500.0000 mL | Freq: Once | INTRAVENOUS | Status: AC
  Administered 2011-06-30: 500 mL via INTRAVENOUS

## 2011-06-30 MED ORDER — DEXTROSE 5 % IV SOLN
20.0000 mmol | Freq: Once | INTRAVENOUS | Status: AC
  Administered 2011-06-30: 20 mmol via INTRAVENOUS
  Filled 2011-06-30: qty 6.67

## 2011-06-30 MED ORDER — POTASSIUM CHLORIDE CRYS ER 20 MEQ PO TBCR
40.0000 meq | EXTENDED_RELEASE_TABLET | Freq: Once | ORAL | Status: AC
  Administered 2011-06-30: 40 meq via ORAL
  Filled 2011-06-30: qty 2

## 2011-06-30 MED ORDER — METHYLPREDNISOLONE SODIUM SUCC 125 MG IJ SOLR
60.0000 mg | Freq: Three times a day (TID) | INTRAMUSCULAR | Status: DC
  Administered 2011-06-30 – 2011-07-01 (×4): 60 mg via INTRAVENOUS
  Filled 2011-06-30 (×7): qty 0.96

## 2011-06-30 MED ORDER — VENLAFAXINE HCL ER 37.5 MG PO CP24
112.5000 mg | ORAL_CAPSULE | Freq: Every day | ORAL | Status: DC
  Administered 2011-06-30 – 2011-07-04 (×5): 112.5 mg via ORAL
  Filled 2011-06-30 (×6): qty 3

## 2011-06-30 NOTE — Progress Notes (Signed)
Patient: Victoria [name] DOB: January 30, 1956 Date of Admission: 06/29/2011 Pulmonary MD: Marchelle Gearing               Pulmonary Followup CC - chest pain  BRIEF-  56yo female pt of Dr. Marchelle Gearing with hx severe  persistent asthma on chronic prednisone 5mg  , spiriva, asmanex, double dose singulair. Baseline spiro: 05/31/11 - fev2 1.4L/56%, Ratio 60 which is c/w her stable state.   And ABPA (IgE Dec  2012 - > 1000 and prior short trial of itraconazole 2008/2009 but retrial under consideration), hx of several AE-asthma that is usually managed at home with phone calls/self titration of prednisone. She reports frequent URI since October 2012 and frequent cough since that time.  Seen in pulmonary office again November 2012 and rx with Azithro and extended prednisone taper. She traveled over the past week and returned 1/6 and that evening developed fever, worsening cough, nasal congestion/drainage and malaise.  Also c/o R sided pleuritic chest pain that felt similar to a previous pneumothorax.  Reports no wheezing, no increased rescue inhaler use, SOB, n/v/d, swelling.  Presented 1/9 to ER with worsening malaise, fever and chest pain.  ED w/u revealed possible UTI and CXR with likely PNA and PCCM asked to admit 06/29/11.    CULTURES No results found for this or any previous visit (from the past 240 hour(s)). No results found for this or any previous visit. Blood PCT Lactate Urine leg Urine strep Flu PCR  ANTIBIOTICS Anti-infectives     Start     Dose/Rate Route Frequency Ordered Stop   06/30/11 0900   vancomycin (VANCOCIN) IVPB 1000 mg/200 mL premix        1,000 mg 200 mL/hr over 60 Minutes Intravenous Every 12 hours 06/29/11 2100     06/29/11 2130  piperacillin-tazobactam (ZOSYN) IVPB 3.375 g       3.375 g 12.5 mL/hr over 240 Minutes Intravenous 3 times per day 06/29/11 2100     06/29/11 2100   vancomycin (VANCOCIN) 1,250 mg in sodium chloride 0.9 % 250 mL IVPB        1,250 mg 166.7 mL/hr over 90 Minutes  Intravenous  Once 06/29/11 2100 06/30/11 0113        Tamiflu 1/10 >> Azithro 1/10 >>  EVENTS   OVERNIGHT/SUBJECTIVE/INTERVAL HX FEels cough from October never resolved.  Feeling some better after antibiotics last night. CT sinus - no acute sinusitis BP a bit soft this am - (BAseline in office 110/80)    Filed Vitals:   06/29/11 1941 06/29/11 2015 06/29/11 2109 06/30/11 0500  BP: 134/70 110/74 95/58 94/56   Pulse: 111 115 111 86  Temp:  97.4 F (36.3 C) 100 F (37.8 C) 98.6 F (37 C)  TempSrc:  Oral Oral Oral  Resp:   20 18  Height:  5\' 3"  (1.6 m) 5\' 3"  (1.6 m)   Weight:  77.1 kg (169 lb 15.6 oz)    SpO2: 96% 95% 96% 94%   EXAM: General appearance: NAD, conversant . Was sleeping and I woke her up Eyes: anicteric sclerae, moist conjunctivae; no lid-lag; PERRLA HENT: Atraumatic; oropharynx clear with moist mucous membranes and no mucosal ulcerations; normal hard and soft palate Neck: Trachea midline; FROM, supple, no thyromegaly or lymphadenopathy Lungs: CTA, with normal respiratory effort and no intercostal retractions. No wheeze CV: RRR, no MRGs  Abdomen: Soft, non-tender; no masses or HSM Extremities: No peripheral edema or extremity lymphadenopathy Skin: Normal temperature, turgor and texture; no rash, ulcers or subcutaneous nodules Psych:  Appropriate affect, alert and oriented to person, place and time      chest X-ray Dg Chest 2 View  06/29/2011  *RADIOLOGY REPORT*  Clinical Data: 56 year old female with cough and right chest pain. Fever.  CHEST - 2 VIEW  Comparison: 04/28/2011 earlier.  Findings: Stable lung volumes.  Stable eventration of the left hemidiaphragm.  New patchy bilateral perihilar opacity.  Some apical and lung base scarring is suspected and stable.  Cardiac size and mediastinal contours are within normal limits.  Visualized tracheal air column is within normal limits.  No pleural effusion or pneumothorax. No acute osseous abnormality identified.   IMPRESSION: Bilateral perihilar bronchopneumonia/pneumonia. Recommend post-treatment radiographs to document resolution.  Original Report Authenticated By: Harley Hallmark, M.D.   Ct Max[facility] Wo Cm  06/29/2011  *RADIOLOGY REPORT*  Clinical Data:  Chronic sinusitis with increased drainage. Occasional blood from the base.  CT LIMITED SINUSES WITHOUT CONTRAST  Technique:  Multidetector CT images of the paranasal sinuses were obtained in a single plane without contrast.  Comparison:  MRI head 08/10/2009.  Findings:  There is minimal mucosal thickening along the left frontoethmoidal recess.  Minimal mucosal thickening is also noted along the ostium to the right sphenoid sinus.  No other significant mucosal disease is evident within the paranasal sinuses.  There are no fluid levels.  The mastoid air cells are clear.  The visualized skull is intact.  Limited imaging of the brain is unremarkable.  IMPRESSION:  1.  Minimal mucosal disease within the left frontal lateral recess and at the ostium of the right sphenoid sinus. 2.  No evidence for acute disease or fluid levels.  Original Report Authenticated By: Jamesetta Orleans. MATTERN, M.D.   [facility]    Component Value Date/Time   WBC 19.3* 06/30/2011 0605   RBC 4.06 06/30/2011 0605   HGB 12.2 06/30/2011 0605   HCT 36.6 06/30/2011 0605   PLT 227 06/30/2011 0605   MCV 90.1 06/30/2011 0605   MCH 30.0 06/30/2011 0605   MCHC 33.3 06/30/2011 0605   RDW 13.6 06/30/2011 0605   LYMPHSABS 0.7 06/29/2011 1340   MONOABS 1.0 06/29/2011 1340   EOSABS 0.1 06/29/2011 1340   BASOSABS 0.0 06/29/2011 1340   BMET    Component Value Date/Time   NA 137 06/29/2011 1340   K 3.4* 06/29/2011 1340   CL 101 06/29/2011 1340   CO2 23 06/29/2011 1340   GLUCOSE 101* 06/29/2011 1340   BUN 13 06/29/2011 1340   CREATININE 0.72 06/29/2011 2153   CALCIUM 9.2 06/29/2011 1340   GFRNONAA >90 06/29/2011 2153   GFRAA >90 06/29/2011 2153    IMPRESSION/ PLAN: 1. CAP - with underlying severe asthma. ? Precipitated by  flu Subjectively improved 06/30/11 am  PLAN -  Abx - vanc/zosyn  Add azithromycin Start tamiflu - await PCR, if negative dc tamiflu Check blood cultures, urine leg and urine strep Rule out UTI  2. Hypotension ? Related to dehydration v early sepsis  PLAN Fluid bolus and increase maintenance rate Goal MAP > 65 Check sepsis biomarkers   2. Asthma - Does not appear to be in AE-asthma PLAN -  Cont maintenance asthma rx  Increase in maintenance dose steroids  To solumedrol due to stress   3. ABPA ? Current infiltrates and febrile illness reflective of severe ABPA PLAN -  Check IgE and ESR (before giving solumedrol)  4. OTHERS  - continue tele today  5. Hypokalemia  - noted on admit 06/29/11 PLAN  - await  bmet result 06/30/11  Jerman Tinnon, 06/30/2011  6:55 AM  Dr. Kalman Shan, M.D., [facility].C.P Pulmonary and Critical Care Medicine Staff Physician [facility] [place] [facility] Pager: [phone], If no answer or between  15:00h - 7:00h: call [phone]  [phone]

## 2011-07-01 MED ORDER — METHYLPREDNISOLONE SODIUM SUCC 125 MG IJ SOLR
60.0000 mg | Freq: Two times a day (BID) | INTRAMUSCULAR | Status: DC
  Administered 2011-07-01: 60 mg via INTRAVENOUS
  Filled 2011-07-01 (×3): qty 0.96

## 2011-07-01 NOTE — Progress Notes (Signed)
ANTIBIOTIC CONSULT NOTE - FOLLOW UP  Pharmacy Consult for Vancomycin and zosyn Indication: rule out pneumonia  Allergies  Allergen Reactions  . Beclomethasone Dipropionate     REACTION: hives and weight gain  . Dulera     REACTION: weight gain and hives  . Sulfonamide Derivatives     REACTION: Rash  . Symbicort     REACTION: gave patient hives    Patient Measurements: Height: 5\' 3"  (160 cm) Weight: 169 lb 15.6 oz (77.1 kg) (From 06/01/11) IBW/kg (Calculated) : 52.4  Adjusted Body Weight:   Vital Signs: Temp: 98 F (36.7 C) (01/11 0500) Temp src: Oral (01/11 0500) BP: 101/66 mmHg (01/11 0500) Pulse Rate: 70  (01/11 0500) Intake/Output from previous day: 01/10 0701 - 01/11 0700 In: 360 [P.O.:360] Out: -  Intake/Output from this shift: Total I/O In: 480 [P.O.:480] Out: -   Labs:  Basename 06/30/11 0605 06/29/11 2153 06/29/11 1340  WBC 19.3* 18.5* 18.3*  HGB 12.2 13.2 13.2  PLT 227 246 220  LABCREA -- -- --  CREATININE 0.68 0.72 0.66   Estimated Creatinine Clearance: 79.1 ml/min (by C-G formula based on Cr of 0.68). No results found for this basename: VANCOTROUGH:2,VANCOPEAK:2,VANCORANDOM:2,GENTTROUGH:2,GENTPEAK:2,GENTRANDOM:2,TOBRATROUGH:2,TOBRAPEAK:2,TOBRARND:2,AMIKACINPEAK:2,AMIKACINTROU:2,AMIKACIN:2, in the last 72 hours   Microbiology: Recent Results (from the past 720 hour(s))  URINE CULTURE     Status: Normal   Collection Time   06/29/11  1:56 PM      Component Value Range Status Comment   Specimen Description URINE, CLEAN CATCH   Final    Special Requests NONE   Final    Setup Time [id number]   Final    Colony Count 75,000 COLONIES/ML   Final    Culture     Final    Value: Multiple bacterial morphotypes present, none predominant. Suggest appropriate recollection if clinically indicated.   Report Status 06/30/2011 FINAL   Final   CULTURE, BLOOD (ROUTINE X 2)     Status: Normal (Preliminary result)   Collection Time   06/30/11  9:08 AM      Component  Value Range Status Comment   Specimen Description BLOOD RIGHT ANTECUBITAL   Final    Special Requests     Final    Value: Immunocompromised BOTTLES DRAWN AEROBIC AND ANAEROBIC 10CC   Setup Time [id number]   Final    Culture     Final    Value:        BLOOD CULTURE RECEIVED NO GROWTH TO DATE CULTURE WILL BE HELD FOR 5 DAYS BEFORE ISSUING A FINAL NEGATIVE REPORT   Report Status PENDING   Incomplete   CULTURE, BLOOD (ROUTINE X 2)     Status: Normal (Preliminary result)   Collection Time   06/30/11  9:15 AM      Component Value Range Status Comment   Specimen Description BLOOD RIGHT HAND   Final    Special Requests Immunocompromised BOTTLES DRAWN AEROBIC ONLY 7CC   Final    Setup Time [id number]   Final    Culture     Final    Value:        BLOOD CULTURE RECEIVED NO GROWTH TO DATE CULTURE WILL BE HELD FOR 5 DAYS BEFORE ISSUING A FINAL NEGATIVE REPORT   Report Status PENDING   Incomplete     Anti-infectives     Start     Dose/Rate Route Frequency Ordered Stop   06/30/11 1000   oseltamivir (TAMIFLU) capsule 75 mg  Status:  Discontinued  75 mg Oral 2 times daily 06/30/11 0710 06/30/11 1400   06/30/11 0900   vancomycin (VANCOCIN) IVPB 1000 mg/200 mL premix        1,000 mg 200 mL/hr over 60 Minutes Intravenous Every 12 hours 06/29/11 2100     06/30/11 0900   azithromycin (ZITHROMAX) 500 mg in dextrose 5 % 250 mL IVPB        500 mg 250 mL/hr over 60 Minutes Intravenous Every 24 hours 06/30/11 0710     06/29/11 2130  piperacillin-tazobactam (ZOSYN) IVPB 3.375 g       3.375 g 12.5 mL/hr over 240 Minutes Intravenous 3 times per day 06/29/11 2100     06/29/11 2100   vancomycin (VANCOCIN) 1,250 mg in sodium chloride 0.9 % 250 mL IVPB        1,250 mg 166.7 mL/hr over 90 Minutes Intravenous  Once 06/29/11 2100 06/30/11 0113          Assessment: Admit Complaint: Pharmacist System-Based Medication Review: Anticoagulation: heparin SQ to VTE px  Infectious Disease: Vanc/Zosyn   Day #3, azith day #2 for CAP day #3; flu PCR negative, afebrile, BCX on 1/10 NGTD, UCX on 1/09 with multi bact; PCT 0.80; Planning on changing abx to PO on 1/12   Cardiovascular: VSS (asa)  Endocrinology: no hx DM  Gastrointestinal / Nutrition: heart health diet  Neurology:depression: depression (effexor)  Nephrology: no new bmet; Phos 1.9  Pulmonary: RA; hx asthma and COPD (spiriva, asmanex, singulair, solumedrol decreased to bid today)  PTA Medication Issues: no issues  Best Practices:  sq heparin, po protonix   Goal of Therapy:  Vancomycin trough level 15-20 mcg/ml  Plan:  1) with plan to change to oral abx soon, will continue with current abx regimens for now and hold off on getting level for vancomycin  Nasira Janusz P 07/01/2011,1:20 PM

## 2011-07-01 NOTE — Progress Notes (Signed)
Patient: Victoria [name] DOB: 09-29-1955 Date of Admission: 06/29/2011 Pulmonary MD: Marchelle Gearing               Pulmonary Followup   LOS 2 days  CC - chest pain  BRIEF-  56yo female pt of Dr. Marchelle Gearing with hx severe  persistent asthma on chronic prednisone 5mg  , spiriva, asmanex, double dose singulair. Baseline spiro: 05/31/11 - fev2 1.4L/56%, Ratio 60 which is c/w her stable state.   And ABPA (IgE Dec  2012 - > 1000 and prior short trial of itraconazole 2008/2009 but retrial under consideration), hx of several AE-asthma that is usually managed at home with phone calls/self titration of prednisone. She reports frequent URI since October 2012 and frequent cough since that time.  Seen in pulmonary office again November 2012 and rx with Azithro and extended prednisone taper. She traveled over the past week and returned 1/6 and that evening developed fever, worsening cough, nasal congestion/drainage and malaise.  Also c/o R sided pleuritic chest pain that felt similar to a previous pneumothorax.  Reports no wheezing, no increased rescue inhaler use, SOB, n/v/d, swelling.  Presented 1/9 to ER with worsening malaise, fever and chest pain.  ED w/u revealed possible UTI and CXR with likely PNA and PCCM asked to admit 06/29/11.    CULTURES Recent Results (from the past 240 hour(s))  URINE CULTURE     Status: Normal   Collection Time   06/29/11  1:56 PM      Component Value Range Status Comment   Specimen Description URINE, CLEAN CATCH   Final    Special Requests NONE   Final    Setup Time [id number]   Final    Colony Count 75,000 COLONIES/ML   Final    Culture     Final    Value: Multiple bacterial morphotypes present, none predominant. Suggest appropriate recollection if clinically indicated.   Report Status 06/30/2011 FINAL   Final    Results for orders placed during the hospital encounter of 06/29/11  URINE CULTURE     Status: Normal   Collection Time   06/29/11  1:56 PM      Component Value Range Status  Comment   Specimen Description URINE, CLEAN CATCH   Final    Special Requests NONE   Final    Setup Time [id number]   Final    Colony Count 75,000 COLONIES/ML   Final    Culture     Final    Value: Multiple bacterial morphotypes present, none predominant. Suggest appropriate recollection if clinically indicated.   Report Status 06/30/2011 FINAL   Final    Blood PCT 1/10 - 0.8 Lactate 1/10 - 1/4 Urine leg Urine strep - NEG Flu PCR - NEG  ANTIBIOTICS Anti-infectives     Start     Dose/Rate Route Frequency Ordered Stop   06/30/11 1000   oseltamivir (TAMIFLU) capsule 75 mg  Status:  Discontinued        75 mg Oral 2 times daily 06/30/11 0710 06/30/11 1400   06/30/11 0900   vancomycin (VANCOCIN) IVPB 1000 mg/200 mL premix        1,000 mg 200 mL/hr over 60 Minutes Intravenous Every 12 hours 06/29/11 2100     06/30/11 0900   azithromycin (ZITHROMAX) 500 mg in dextrose 5 % 250 mL IVPB        500 mg 250 mL/hr over 60 Minutes Intravenous Every 24 hours 06/30/11 0710     06/29/11 2130  piperacillin-tazobactam (ZOSYN) IVPB  3.375 g       3.375 g 12.5 mL/hr over 240 Minutes Intravenous 3 times per day 06/29/11 2100     06/29/11 2100   vancomycin (VANCOCIN) 1,250 mg in sodium chloride 0.9 % 250 mL IVPB        1,250 mg 166.7 mL/hr over 90 Minutes Intravenous  Once 06/29/11 2100 06/30/11 0113        Tamiflu 1/10 >> Azithro 1/10 >>  EVENTS   OVERNIGHT/SUBJECTIVE/INTERVAL HX FEels cough from October never resolved.  Feels much better. Was able to read books. Improving strength and appetitie. Wants to take shower today. But feels needs another 2-3 days to get fully better  Sinus on monitor bp normal HR normal  Filed Vitals:   06/30/11 0905 06/30/11 1300 06/30/11 2100 07/01/11 0500  BP:  92/59 95/62 101/66  Pulse:  102 82 70  Temp:  99.1 F (37.3 C) 98 F (36.7 C) 98 F (36.7 C)  TempSrc:   Oral Oral  Resp:  16 18 18   Height:      Weight:      SpO2: 95% 95% 97% 98%    EXAM: General appearance: NAD, conversant . Looks better. Cushingoid baseline Eyes: anicteric sclerae, moist conjunctivae; no lid-lag; PERRLA HENT: Atraumatic; oropharynx clear with moist mucous membranes and no mucosal ulcerations; normal hard and soft palate Neck: Trachea midline; FROM, supple, no thyromegaly or lymphadenopathy Lungs: CTA, with normal respiratory effort and no intercostal retractions. No wheeze CV: RRR, no MRGs  Abdomen: Soft, non-tender; no masses or HSM Extremities: No peripheral edema or extremity lymphadenopathy Skin: Normal temperature, turgor and texture; no rash, ulcers or subcutaneous nodules Psych: Appropriate affect, alert and oriented to person, place and time      chest X-ray Dg Chest 2 View  06/29/2011  *RADIOLOGY REPORT*  Clinical Data: 56 year old female with cough and right chest pain. Fever.  CHEST - 2 VIEW  Comparison: 04/28/2011 earlier.  Findings: Stable lung volumes.  Stable eventration of the left hemidiaphragm.  New patchy bilateral perihilar opacity.  Some apical and lung base scarring is suspected and stable.  Cardiac size and mediastinal contours are within normal limits.  Visualized tracheal air column is within normal limits.  No pleural effusion or pneumothorax. No acute osseous abnormality identified.  IMPRESSION: Bilateral perihilar bronchopneumonia/pneumonia. Recommend post-treatment radiographs to document resolution.  Original Report Authenticated By: Harley Hallmark, M.D.   Ct Maxillofacial [facility] Wo Cm  06/29/2011  *RADIOLOGY REPORT*  Clinical Data:  Chronic sinusitis with increased drainage. Occasional blood from the base.  CT LIMITED SINUSES WITHOUT CONTRAST  Technique:  Multidetector CT images of the paranasal sinuses were obtained in a single plane without contrast.  Comparison:  MRI head 08/10/2009.  Findings:  There is minimal mucosal thickening along the left frontoethmoidal recess.  Minimal mucosal thickening is also noted along the  ostium to the right sphenoid sinus.  No other significant mucosal disease is evident within the paranasal sinuses.  There are no fluid levels.  The mastoid air cells are clear.  The visualized skull is intact.  Limited imaging of the brain is unremarkable.  IMPRESSION:  1.  Minimal mucosal disease within the left frontal lateral recess and at the ostium of the right sphenoid sinus. 2.  No evidence for acute disease or fluid levels.  Original Report Authenticated By: Jamesetta Orleans. MATTERN, M.D.   CBC    Component Value Date/Time   WBC 19.3* 06/30/2011 0605   RBC 4.06 06/30/2011 4010  HGB 12.2 06/30/2011 0605   HCT 36.6 06/30/2011 0605   PLT 227 06/30/2011 0605   MCV 90.1 06/30/2011 0605   MCH 30.0 06/30/2011 0605   MCHC 33.3 06/30/2011 0605   RDW 13.6 06/30/2011 0605   LYMPHSABS 0.7 06/29/2011 1340   MONOABS 1.0 06/29/2011 1340   EOSABS 0.1 06/29/2011 1340   BASOSABS 0.0 06/29/2011 1340   BMET    Component Value Date/Time   NA 135 06/30/2011 0605   K 3.1* 06/30/2011 0605   CL 103 06/30/2011 0605   CO2 23 06/30/2011 0605   GLUCOSE 114* 06/30/2011 0605   BUN 11 06/30/2011 0605   CREATININE 0.68 06/30/2011 0605   CALCIUM 9.1 06/30/2011 0605   GFRNONAA >90 06/30/2011 0605   GFRAA >90 06/30/2011 0605     Lab Results  Component Value Date   CKTOTAL 45 10/22/2008   CKMB 1.5 10/22/2008   TROPONINI  Value: 0.02        NO INDICATION OF MYOCARDIAL INJURY. 10/22/2008     IMPRESSION/ PLAN: 1. CAP - with underlying severe asthma. ? Precipitated by flu Subjectively improved 07/01/11 am. No fever x 24h./ Will finish 48h antibiotic tonight. Flu negative and off tamiflu  PLAN -  Abx - vanc/zosyn /azithro Add azithromycin Start po antibiotics later tonight or tomorrow 1/12 am  2. Hypotension ? Related to dehydration v early sepsis. Resolved 07/01/11 with fluids  PLAN Saline lox  2. Asthma - Does not appear to be in AE-asthma. CHronic pred at home PLAN -  Cont maintenance asthma rx  Wean solumedrol today  1/11 Change to pred 1/12 am   3. ABPA ? Current infiltrates and febrile illness reflective of severe ABPA. Doubt PLAN -  Await  IgE and ESR   4. OTHERS  - dc tele today  5. Hypokalemia and hypphostaemia - repleted 06/30/11 PLAN  - recheck 07/02/11  Lindberg Zenon, 07/01/2011  7:20 AM  Dr. Kalman Shan, M.D., [facility].C.P Pulmonary and Critical Care Medicine Staff Physician [facility] [place] [facility] and Critical [facility] Pager: [phone], If no answer or between  15:00h - 7:00h: call [phone]

## 2011-07-02 ENCOUNTER — Inpatient Hospital Stay (HOSPITAL_COMMUNITY): Payer: Managed Care, Other (non HMO)

## 2011-07-02 DIAGNOSIS — R0902 Hypoxemia: Secondary | ICD-10-CM

## 2011-07-02 LAB — CBC
MCV: 88.9 fL (ref 78.0–100.0)
Platelets: 312 10*3/uL (ref 150–400)
RBC: 3.96 MIL/uL (ref 3.87–5.11)
RDW: 13.3 % (ref 11.5–15.5)
WBC: 17.6 10*3/uL — ABNORMAL HIGH (ref 4.0–10.5)

## 2011-07-02 LAB — MAGNESIUM: Magnesium: 2.4 mg/dL (ref 1.5–2.5)

## 2011-07-02 LAB — BASIC METABOLIC PANEL
CO2: 26 mEq/L (ref 19–32)
Chloride: 106 mEq/L (ref 96–112)
Creatinine, Ser: 0.62 mg/dL (ref 0.50–1.10)
GFR calc Af Amer: >90 mL/min (ref >90)
Sodium: 140 mEq/L (ref 135–145)

## 2011-07-02 LAB — PHOSPHORUS: Phosphorus: 2.8 mg/dL (ref 2.3–4.6)

## 2011-07-02 MED ORDER — PREDNISONE 20 MG PO TABS
20.0000 mg | ORAL_TABLET | Freq: Two times a day (BID) | ORAL | Status: DC
  Administered 2011-07-02 – 2011-07-03 (×3): 20 mg via ORAL
  Filled 2011-07-02 (×5): qty 1

## 2011-07-02 MED ORDER — ZOLPIDEM TARTRATE 5 MG PO TABS
10.0000 mg | ORAL_TABLET | Freq: Every evening | ORAL | Status: DC | PRN
  Administered 2011-07-02: 5 mg via ORAL
  Administered 2011-07-02: 10 mg via ORAL
  Administered 2011-07-02 – 2011-07-03 (×2): 5 mg via ORAL
  Filled 2011-07-02: qty 2
  Filled 2011-07-02: qty 1
  Filled 2011-07-02: qty 2

## 2011-07-02 MED ORDER — AZITHROMYCIN 500 MG PO TABS
500.0000 mg | ORAL_TABLET | Freq: Every day | ORAL | Status: DC
  Administered 2011-07-02 – 2011-07-04 (×3): 500 mg via ORAL
  Filled 2011-07-02 (×3): qty 1

## 2011-07-02 MED ORDER — MOXIFLOXACIN HCL 400 MG PO TABS
400.0000 mg | ORAL_TABLET | Freq: Every day | ORAL | Status: DC
  Administered 2011-07-02 – 2011-07-03 (×2): 400 mg via ORAL
  Filled 2011-07-02 (×3): qty 1

## 2011-07-02 NOTE — Progress Notes (Signed)
Patient: Victoria [name] DOB: 1955-11-17 Date of Admission: 06/29/2011 Pulmonary MD: Marchelle Gearing               Pulmonary Followup   LOS 3 days  CC - chest pain  BRIEF-  56yo female pt of Dr. Marchelle Gearing with hx severe  persistent asthma on chronic prednisone 5mg  , spiriva, asmanex, double dose singulair. Baseline spiro: 05/31/11 - fev1 1.4L/56%, Ratio 60 which is c/w her stable state.   And ABPA (IgE Dec  2012 - > 1000 and prior short trial of itraconazole 2008/2009 but retrial under consideration), hx of several AE-asthma that is usually managed at home with phone calls/self titration of prednisone. She reports frequent URI since October 2012 and frequent cough since that time.  Seen in pulmonary office again November 2012 and rx with Azithro and extended prednisone taper. She traveled over the past week and returned 1/6 and that evening developed fever, worsening cough, nasal congestion/drainage and malaise.  Also c/o R sided pleuritic chest pain that felt similar to a previous pneumothorax.  Reports no wheezing, no increased rescue inhaler use, SOB, n/v/d, swelling.  Presented 1/9 to ER with worsening malaise, fever and chest pain.  ED w/u revealed possible UTI and CXR with likely PNA and PCCM asked to admit 06/29/11.    CULTURES Recent Results (from the past 240 hour(s))  URINE CULTURE     Status: Normal   Collection Time   06/29/11  1:56 PM      Component Value Range Status Comment   Specimen Description URINE, CLEAN CATCH   Final    Special Requests NONE   Final    Setup Time [id number]   Final    Colony Count 75,000 COLONIES/ML   Final    Culture     Final    Value: Multiple bacterial morphotypes present, none predominant. Suggest appropriate recollection if clinically indicated.   Report Status 06/30/2011 FINAL   Final   URINE CULTURE     Status: Normal   Collection Time   06/30/11  8:41 AM      Component Value Range Status Comment   Specimen Description URINE, CLEAN CATCH   Final    Special  Requests NONE   Final    Setup Time [id number]   Final    Colony Count NO GROWTH   Final    Culture NO GROWTH   Final    Report Status 07/01/2011 FINAL   Final   CULTURE, BLOOD (ROUTINE X 2)     Status: Normal (Preliminary result)   Collection Time   06/30/11  9:08 AM      Component Value Range Status Comment   Specimen Description BLOOD RIGHT ANTECUBITAL   Final    Special Requests     Final    Value: Immunocompromised BOTTLES DRAWN AEROBIC AND ANAEROBIC 10CC   Setup Time [id number]   Final    Culture     Final    Value:        BLOOD CULTURE RECEIVED NO GROWTH TO DATE CULTURE WILL BE HELD FOR 5 DAYS BEFORE ISSUING A FINAL NEGATIVE REPORT   Report Status PENDING   Incomplete   CULTURE, BLOOD (ROUTINE X 2)     Status: Normal (Preliminary result)   Collection Time   06/30/11  9:15 AM      Component Value Range Status Comment   Specimen Description BLOOD RIGHT HAND   Final    Special Requests Immunocompromised BOTTLES DRAWN AEROBIC ONLY 7CC  Final    Setup Time [id number]   Final    Culture     Final    Value:        BLOOD CULTURE RECEIVED NO GROWTH TO DATE CULTURE WILL BE HELD FOR 5 DAYS BEFORE ISSUING A FINAL NEGATIVE REPORT   Report Status PENDING   Incomplete    Results for orders placed during the hospital encounter of 06/29/11  URINE CULTURE     Status: Normal   Collection Time   06/29/11  1:56 PM      Component Value Range Status Comment   Specimen Description URINE, CLEAN CATCH   Final    Special Requests NONE   Final    Setup Time [id number]   Final    Colony Count 75,000 COLONIES/ML   Final    Culture     Final    Value: Multiple bacterial morphotypes present, none predominant. Suggest appropriate recollection if clinically indicated.   Report Status 06/30/2011 FINAL   Final   URINE CULTURE     Status: Normal   Collection Time   06/30/11  8:41 AM      Component Value Range Status Comment   Specimen Description URINE, CLEAN CATCH   Final    Special Requests NONE    Final    Setup Time [id number]   Final    Colony Count NO GROWTH   Final    Culture NO GROWTH   Final    Report Status 07/01/2011 FINAL   Final   CULTURE, BLOOD (ROUTINE X 2)     Status: Normal (Preliminary result)   Collection Time   06/30/11  9:08 AM      Component Value Range Status Comment   Specimen Description BLOOD RIGHT ANTECUBITAL   Final    Special Requests     Final    Value: Immunocompromised BOTTLES DRAWN AEROBIC AND ANAEROBIC 10CC   Setup Time [id number]   Final    Culture     Final    Value:        BLOOD CULTURE RECEIVED NO GROWTH TO DATE CULTURE WILL BE HELD FOR 5 DAYS BEFORE ISSUING A FINAL NEGATIVE REPORT   Report Status PENDING   Incomplete   CULTURE, BLOOD (ROUTINE X 2)     Status: Normal (Preliminary result)   Collection Time   06/30/11  9:15 AM      Component Value Range Status Comment   Specimen Description BLOOD RIGHT HAND   Final    Special Requests Immunocompromised BOTTLES DRAWN AEROBIC ONLY 7CC   Final    Setup Time [id number]   Final    Culture     Final    Value:        BLOOD CULTURE RECEIVED NO GROWTH TO DATE CULTURE WILL BE HELD FOR 5 DAYS BEFORE ISSUING A FINAL NEGATIVE REPORT   Report Status PENDING   Incomplete    Blood PCT 1/10 - 0.8 Lactate 1/10 - 1/4 Urine leg Urine strep - NEG Flu PCR - NEG  ANTIBIOTICS Anti-infectives     Start     Dose/Rate Route Frequency Ordered Stop   06/30/11 1000   oseltamivir (TAMIFLU) capsule 75 mg  Status:  Discontinued        75 mg Oral 2 times daily 06/30/11 0710 06/30/11 1400   06/30/11 0900   vancomycin (VANCOCIN) IVPB 1000 mg/200 mL premix        1,000 mg 200 mL/hr over 60 Minutes Intravenous Every 12 hours  06/29/11 2100     06/30/11 0900   azithromycin (ZITHROMAX) 500 mg in dextrose 5 % 250 mL IVPB        500 mg 250 mL/hr over 60 Minutes Intravenous Every 24 hours 06/30/11 0710     06/29/11 2130  piperacillin-tazobactam (ZOSYN) IVPB 3.375 g       3.375 g 12.5 mL/hr over 240 Minutes  Intravenous 3 times per day 06/29/11 2100     06/29/11 2100   vancomycin (VANCOCIN) 1,250 mg in sodium chloride 0.9 % 250 mL IVPB        1,250 mg 166.7 mL/hr over 90 Minutes Intravenous  Once 06/29/11 2100 06/30/11 0113        Tamiflu 1/10 >> Azithro 1/10 >>  EVENTS   OVERNIGHT/SUBJECTIVE/INTERVAL HX FEels cough from October never resolved.  Feels much better. Was able to read books. Improving strength and appetitie. Wants to take shower today. But feels needs another 2-3 days to get fully better  Sinus on monitor bp normal HR normal  Filed Vitals:   07/01/11 0909 07/01/11 1443 07/01/11 2129 07/02/11 0600  BP:  106/67 108/71 103/65  Pulse:  90 77 81  Temp:  97.3 F (36.3 C) 98 F (36.7 C) 98.1 F (36.7 C)  TempSrc:   Oral   Resp:  18 20 20   Height:      Weight:      SpO2: 97% 98% 98% 97%   EXAM: General appearance: NAD, conversant . Looks better. Cushingoid baseline Eyes: anicteric sclerae, moist conjunctivae; no lid-lag; PERRLA HENT: Atraumatic; oropharynx clear with moist mucous membranes and no mucosal ulcerations; normal hard and soft palate Neck: Trachea midline; FROM, supple, no thyromegaly or lymphadenopathy Lungs: CTA, with normal respiratory effort and no intercostal retractions. No wheeze CV: RRR, no MRGs  Abdomen: Soft, non-tender; no masses or HSM Extremities: No peripheral edema or extremity lymphadenopathy Skin: Normal temperature, turgor and texture; no rash, ulcers or subcutaneous nodules Psych: Appropriate affect, alert and oriented to person, place and time   chest X-ray CXR 1/12>>  Reviewed by me: port film> persist bilat infiltrates.  CXR 1/9>> IMPRESSION:  Bilateral perihilar bronchopneumonia/pneumonia.  Recommend post-treatment radiographs to document resolution.  CT SINUS 1/9>> IMPRESSION:  1. Minimal mucosal disease within the left frontal lateral recess  and at the ostium of the right sphenoid sinus.  2. No evidence for acute disease  or fluid levels.   CBC    Component Value Date/Time   WBC 17.6* 07/01/2011 2241   RBC 3.96 07/01/2011 2241   HGB 11.9* 07/01/2011 2241   HCT 35.2* 07/01/2011 2241   PLT 312 07/01/2011 2241   MCV 88.9 07/01/2011 2241   MCH 30.1 07/01/2011 2241   MCHC 33.8 07/01/2011 2241   RDW 13.3 07/01/2011 2241   LYMPHSABS 0.7 06/29/2011 1340   MONOABS 1.0 06/29/2011 1340   EOSABS 0.1 06/29/2011 1340   BASOSABS 0.0 06/29/2011 1340   BMET    Component Value Date/Time   NA 140 07/02/2011 0545   K 4.1 07/02/2011 0545   CL 106 07/02/2011 0545   CO2 26 07/02/2011 0545   GLUCOSE 161* 07/02/2011 0545   BUN 14 07/02/2011 0545   CREATININE 0.62 07/02/2011 0545   CALCIUM 8.9 07/02/2011 0545   GFRNONAA >90 07/02/2011 0545   GFRAA >90 07/02/2011 0545     Lab Results  Component Value Date   CKTOTAL 45 10/22/2008   CKMB 1.5 10/22/2008   TROPONINI  Value: 0.02  NO INDICATION OF MYOCARDIAL INJURY. 10/22/2008     IMPRESSION/ PLAN: 1. CAP - with underlying severe asthma. ? Precipitated by flu Subjectively improved 07/01/11 am. No fever x 24h./ Will finish 48h antibiotic tonight. Flu negative and off tamiflu   1/12> on IV Zithro/ Zosyn/ Vanco & Solumedrol at 60mg Q12H... Bl cult neg x2, Urine C&S neg, Sput ?no result... On Nebs, Asmanex, Spiriva, Singulair, Protonix...  PLAN>> DC IV antiiotics in favor of PO Zithromax & Avelox DC IV Solumedrol in favor of PO Pred IV NSL OOB in chair, wants to shower   2. Hypotension ? Related to dehydration v early sepsis. Resolved 07/01/11 with fluids PLAN Saline lox  3. Asthma - Does not appear to be in AE-asthma. Chronic pred at home PLAN -  Cont maintenance asthma rx  Wean solumedrol today 1/11 Change to pred 1/12 am   4. ABPA ? Current infiltrates and febrile illness reflective of severe ABPA. Doubt PLAN -  Await  IgE and ESR   5. OTHERS: Hypokalemia and hypphostaemia - repleted 06/30/11 PLAN  - recheck 07/02/11  NADEL,SCOTT M, 07/02/2011  8:16 AM

## 2011-07-02 NOTE — Progress Notes (Signed)
eLink Physician-Brief Progress Note Patient Name: Victoria [name] DOB: [birth year] MRN: [id number]  Date of Service  07/02/2011   HPI/Events of Note   Insomnia  eICU Interventions  Order for Ambien 10 mg qhs prn   Intervention Category Minor Interventions: Routine modifications to care plan (e.g. PRN medications for pain, fever)  DETERDING,ELIZABETH 07/02/2011, 12:03 AM

## 2011-07-03 DIAGNOSIS — J45909 Unspecified asthma, uncomplicated: Secondary | ICD-10-CM

## 2011-07-03 DIAGNOSIS — R0902 Hypoxemia: Secondary | ICD-10-CM

## 2011-07-03 DIAGNOSIS — J189 Pneumonia, unspecified organism: Secondary | ICD-10-CM

## 2011-07-03 LAB — EXPECTORATED SPUTUM ASSESSMENT W GRAM STAIN, RFLX TO RESP C

## 2011-07-03 LAB — CULTURE, RESPIRATORY W GRAM STAIN

## 2011-07-03 MED ORDER — PREDNISONE 20 MG PO TABS
20.0000 mg | ORAL_TABLET | Freq: Every day | ORAL | Status: DC
  Administered 2011-07-04: 20 mg via ORAL
  Filled 2011-07-03 (×2): qty 1

## 2011-07-03 NOTE — Progress Notes (Signed)
Patient: Victoria [name] DOB: [birth year] Date of Admission: 06/29/2011 Pulmonary MD: Marchelle Gearing                    Pulmonary Progress Note  LOS 4 days  CC - chest pain  BRIEF-  56yo female pt of Dr. Marchelle Gearing with hx severe persistent asthma on chronic prednisone 5mg , spiriva, asmanex, double dose singulair. Baseline spiro: 05/31/11 - fev1 1.4L/56%, Ratio 60 which is c/w her stable state.   And ABPA (IgE Dec  2012 - > 1000 and prior short trial of itraconazole 2008/2009 but retrial under consideration), hx of several AE-asthma that is usually managed at home with phone calls/self titration of prednisone. She reports frequent URI since October 2012 and frequent cough since that time.  Seen in pulmonary office again November 2012 and rx with Azithro and extended prednisone taper. She traveled over the past week and returned 1/6 and that evening developed fever, worsening cough, nasal congestion/drainage and malaise.  Also c/o R sided pleuritic chest pain that felt similar to a previous pneumothorax.  Reports no wheezing, no increased rescue inhaler use, SOB, n/v/d, swelling.  Presented 1/9 to ER with worsening malaise, fever and chest pain.  ED w/u revealed possible UTI and CXR with likely PNA and PCCM asked to admit 06/29/11.    CULTURES Recent Results (from the past 240 hour(s))  URINE CULTURE     Status: Normal   Collection Time   06/29/11  1:56 PM      Component Value Range Status Comment   Specimen Description URINE, CLEAN CATCH   Final    Special Requests NONE   Final    Setup Time [id number]   Final    Colony Count 75,000 COLONIES/ML   Final    Culture     Final    Value: Multiple bacterial morphotypes present, none predominant. Suggest appropriate recollection if clinically indicated.   Report Status 06/30/2011 FINAL   Final   URINE CULTURE     Status: Normal   Collection Time   06/30/11  8:41 AM      Component Value Range Status Comment   Specimen Description URINE, CLEAN CATCH   Final    Special Requests NONE   Final    Setup Time [id number]   Final    Colony Count NO GROWTH   Final    Culture NO GROWTH   Final    Report Status 07/01/2011 FINAL   Final   CULTURE, BLOOD (ROUTINE X 2)     Status: Normal (Preliminary result)   Collection Time   06/30/11  9:08 AM      Component Value Range Status Comment   Specimen Description BLOOD RIGHT ANTECUBITAL   Final    Special Requests     Final    Value: Immunocompromised BOTTLES DRAWN AEROBIC AND ANAEROBIC 10CC   Setup Time [id number]   Final    Culture     Final    Value:        BLOOD CULTURE RECEIVED NO GROWTH TO DATE CULTURE WILL BE HELD FOR 5 DAYS BEFORE ISSUING A FINAL NEGATIVE REPORT   Report Status PENDING   Incomplete   CULTURE, BLOOD (ROUTINE X 2)     Status: Normal (Preliminary result)   Collection Time   06/30/11  9:15 AM      Component Value Range Status Comment   Specimen Description BLOOD RIGHT HAND   Final    Special Requests Immunocompromised BOTTLES DRAWN AEROBIC ONLY 7CC  Final    Setup Time [id number]   Final    Culture     Final    Value:        BLOOD CULTURE RECEIVED NO GROWTH TO DATE CULTURE WILL BE HELD FOR 5 DAYS BEFORE ISSUING A FINAL NEGATIVE REPORT   Report Status PENDING   Incomplete    Results for orders placed during the hospital encounter of 06/29/11  URINE CULTURE     Status: Normal   Collection Time   06/29/11  1:56 PM      Component Value Range Status Comment   Specimen Description URINE, CLEAN CATCH   Final    Special Requests NONE   Final    Setup Time [id number]   Final    Colony Count 75,000 COLONIES/ML   Final    Culture     Final    Value: Multiple bacterial morphotypes present, none predominant. Suggest appropriate recollection if clinically indicated.   Report Status 06/30/2011 FINAL   Final   URINE CULTURE     Status: Normal   Collection Time   06/30/11  8:41 AM      Component Value Range Status Comment   Specimen Description URINE, CLEAN CATCH   Final    Special  Requests NONE   Final    Setup Time [id number]   Final    Colony Count NO GROWTH   Final    Culture NO GROWTH   Final    Report Status 07/01/2011 FINAL   Final   CULTURE, BLOOD (ROUTINE X 2)     Status: Normal (Preliminary result)   Collection Time   06/30/11  9:08 AM      Component Value Range Status Comment   Specimen Description BLOOD RIGHT ANTECUBITAL   Final    Special Requests     Final    Value: Immunocompromised BOTTLES DRAWN AEROBIC AND ANAEROBIC 10CC   Setup Time [id number]   Final    Culture     Final    Value:        BLOOD CULTURE RECEIVED NO GROWTH TO DATE CULTURE WILL BE HELD FOR 5 DAYS BEFORE ISSUING A FINAL NEGATIVE REPORT   Report Status PENDING   Incomplete   CULTURE, BLOOD (ROUTINE X 2)     Status: Normal (Preliminary result)   Collection Time   06/30/11  9:15 AM      Component Value Range Status Comment   Specimen Description BLOOD RIGHT HAND   Final    Special Requests Immunocompromised BOTTLES DRAWN AEROBIC ONLY 7CC   Final    Setup Time [id number]   Final    Culture     Final    Value:        BLOOD CULTURE RECEIVED NO GROWTH TO DATE CULTURE WILL BE HELD FOR 5 DAYS BEFORE ISSUING A FINAL NEGATIVE REPORT   Report Status PENDING   Incomplete    Blood PCT 1/10 - 0.8 Lactate 1/10 - 1/4 Urine leg Urine strep - NEG Flu PCR - NEG  ANTIBIOTICS Anti-infectives     Start     Dose/Rate Route Frequency Ordered Stop   07/02/11 1400   moxifloxacin (AVELOX) tablet 400 mg        400 mg Oral Daily 07/02/11 0853     07/02/11 1000   azithromycin (ZITHROMAX) tablet 500 mg        500 mg Oral Daily 07/02/11 0853     06/30/11 1000   oseltamivir (TAMIFLU) capsule 75 mg  Status:  Discontinued        75 mg Oral 2 times daily 06/30/11 0710 06/30/11 1400   06/30/11 0900   vancomycin (VANCOCIN) IVPB 1000 mg/200 mL premix  Status:  Discontinued        1,000 mg 200 mL/hr over 60 Minutes Intravenous Every 12 hours 06/29/11 2100 07/02/11 0853   06/30/11 0900   azithromycin  (ZITHROMAX) 500 mg in dextrose 5 % 250 mL IVPB  Status:  Discontinued        500 mg 250 mL/hr over 60 Minutes Intravenous Every 24 hours 06/30/11 0710 07/02/11 0853   06/29/11 2130   piperacillin-tazobactam (ZOSYN) IVPB 3.375 g  Status:  Discontinued        3.375 g 12.5 mL/hr over 240 Minutes Intravenous 3 times per day 06/29/11 2100 07/02/11 0853   06/29/11 2100   vancomycin (VANCOCIN) 1,250 mg in sodium chloride 0.9 % 250 mL IVPB        1,250 mg 166.7 mL/hr over 90 Minutes Intravenous  Once 06/29/11 2100 06/30/11 0113        Tamiflu 1/10 >> Azithro 1/10 >>   OVERNIGHT/SUBJECTIVE/INTERVAL HX Feels cough from October never resolved.  Feels much better. Was able to read books. Improving strength and appetitie. Wants to take shower today. But feels needs another 2-3 days to get fully better  Sinus on monitor bp normal HR normal  Filed Vitals:   07/02/11 0600 07/02/11 1413 07/02/11 2145 07/03/11 0509  BP: 103/65 116/77 114/75 108/61  Pulse: 81 83 82 63  Temp: 98.1 F (36.7 C) 98.2 F (36.8 C) 98 F (36.7 C) 98.2 F (36.8 C)  TempSrc:  Oral Oral Oral  Resp: 20 20 18 18   Height:      Weight:      SpO2: 97% 97% 96% 97%    EXAM: General appearance: NAD, conversant . Looks better. Cushingoid baseline Eyes: anicteric sclerae, moist conjunctivae; no lid-lag; PERRLA HENT: Atraumatic; oropharynx clear with moist mucous membranes and no mucosal ulcerations; normal hard and soft palate Neck: Trachea midline; FROM, supple, no thyromegaly or lymphadenopathy Lungs: CTA, with normal respiratory effort and no intercostal retractions. No wheeze CV: RRR, no MRGs  Abdomen: Soft, non-tender; no masses or HSM Extremities: No peripheral edema or extremity lymphadenopathy Skin: Normal temperature, turgor and texture; no rash, ulcers or subcutaneous nodules Psych: Appropriate affect, alert and oriented to person, place and time   IMAGING: CXR 1/12>> IMPRESSION:  Improved bilateral  patchy airspace disease.  CXR 1/9>> IMPRESSION:  Bilateral perihilar bronchopneumonia/pneumonia.  Recommend post-treatment radiographs to document resolution.  CT SINUS 1/9>> IMPRESSION:  1. Minimal mucosal disease within the left frontal lateral recess  and at the ostium of the right sphenoid sinus.  2. No evidence for acute disease or fluid levels.   CBC    Component Value Date/Time   WBC 17.6* 07/01/2011 2241   RBC 3.96 07/01/2011 2241   HGB 11.9* 07/01/2011 2241   HCT 35.2* 07/01/2011 2241   PLT 312 07/01/2011 2241   MCV 88.9 07/01/2011 2241   MCH 30.1 07/01/2011 2241   MCHC 33.8 07/01/2011 2241   RDW 13.3 07/01/2011 2241   LYMPHSABS 0.7 06/29/2011 1340   MONOABS 1.0 06/29/2011 1340   EOSABS 0.1 06/29/2011 1340   BASOSABS 0.0 06/29/2011 1340   BMET    Component Value Date/Time   NA 140 07/02/2011 0545   K 4.1 07/02/2011 0545   CL 106 07/02/2011 0545   CO2 26 07/02/2011 0545   GLUCOSE  161* 07/02/2011 0545   BUN 14 07/02/2011 0545   CREATININE 0.62 07/02/2011 0545   CALCIUM 8.9 07/02/2011 0545   GFRNONAA >90 07/02/2011 0545   GFRAA >90 07/02/2011 0545    Lab Results  Component Value Date   CKTOTAL 45 10/22/2008   CKMB 1.5 10/22/2008   TROPONINI  Value: 0.02        NO INDICATION OF MYOCARDIAL INJURY. 10/22/2008     IMPRESSION/ PLAN: 1. CAP - with underlying severe asthma. ? Precipitated by flu Subjectively improved 07/01/11 am. No fever x 24h./ Will finish 48h antibiotic tonight. Flu negative and off tamiflu   1/12> on IV Zithro/ Zosyn/ Vanco & Solumedrol at 60mg Q12H... Bl cult neg x2, Urine C&S neg, Sput ?no result... On Nebs, Asmanex, Spiriva, Singulair, Protonix...  1/13> now on PO Zithromax, Avelox, & Pred==> wean to 20mg /d. OOB in chair, wants to shower DC planning  2. Hypotension ? Related to dehydration v early sepsis. Resolved 07/01/11 with fluids PLAN:  NSL  3. Asthma - Does not appear to be in AE-asthma. Chronic pred at home PLAN -  Cont maintenance asthma rx  Wean  solumedrol today 1/11 Change to pred 1/12 am   4. ABPA ? Current infiltrates and febrile illness reflective of severe ABPA. Doubt PLAN -  Await  IgE and ESR   5. OTHERS: Hypokalemia and hypphostaemia - repleted 06/30/11 PLAN:  K=4.1 07/02/11  NADEL,SCOTT M, 07/03/2011  8:47 AM

## 2011-07-04 DIAGNOSIS — I959 Hypotension, unspecified: Secondary | ICD-10-CM

## 2011-07-04 MED ORDER — MOXIFLOXACIN HCL 400 MG PO TABS: 400.0000 mg | ORAL_TABLET | Freq: Every day | ORAL | Status: DC

## 2011-07-04 MED ORDER — AZITHROMYCIN 500 MG PO TABS: 500.0000 mg | ORAL_TABLET | Freq: Every day | ORAL | Status: DC

## 2011-07-04 MED ORDER — PREDNISONE 10 MG PO TABS: 20.0000 mg | ORAL_TABLET | Freq: Every day | ORAL | Status: AC

## 2011-07-04 MED ORDER — MOXIFLOXACIN HCL 400 MG PO TABS: 400.0000 mg | ORAL_TABLET | Freq: Every day | ORAL | Status: AC

## 2011-07-04 MED ORDER — AZITHROMYCIN 500 MG PO TABS: 500.0000 mg | ORAL_TABLET | Freq: Every day | ORAL | Status: AC

## 2011-07-04 NOTE — Discharge Summary (Signed)
Physician Discharge Summary  Patient ID: Victoria [name] MRN: [id number] DOB/AGE: 55/04/1956 55 y.o.  Admit date: 06/29/2011 Discharge date: 07/04/2011    Discharge Diagnoses:  1. Community Aquired Pneumonia 2. Severe Persistent Asthma / ABPA 3. Hypotension 4. Depression / Anxiety 5. Hypokalemia / Hypophosphatemia 6. Hyperglycemia secondary to steroid use    Brief Summary: Victoria [name] is a 56 y.o. y/o female with a PMH of  56 y/o female pt of Dr. Marchelle Gearing with history of severe persistent asthma on chronic prednisone 5mg , spiriva, asmanex, double dose singulair. Baseline spiro: 05/31/11 - fev1 1.4L/56%, Ratio 60 which is consistent her stable state,  ABPA (IgE Dec 2012 - > 1000 and prior short trial of itraconazole 2008 / 2009, hx of several acute exacerbations of asthma that is usually managed at home with phone calls / self titration of prednisone. She reports frequent URI since October 2012 and continued cough since that time. Seen in pulmonary office again November 2012 and treated with Azithro and extended prednisone taper. She traveled to [place] and returned 1/6 and that evening developed fever, worsening cough, nasal congestion / drainage and malaise. Also complained of R sided pleuritic chest pain that felt similar to a previous pneumothorax. She reported no wheezing, no increased rescue inhaler use, SOB, n/v/d, swelling. Presented 1/9 to ER with worsening malaise, fever and chest pain.  ED w/u revealed possible UTI and CXR with likely PNA and PCCM asked to admit 06/29/11.  Admitted to Medical Floor and treated with IV azithromycin, zosyn, vancomycin, tamiflu and steroids.  Cultures negative throughout admission.  Flu screen negative.  Aggressive pulmonary hygiene was instituted.  Noted episode of mild hypotension on 1/11 which was thought related to dehydration and resolved with volume.  She was continued on her home asthma regimen during admission.  She does have known history of ABPA and  was felt that infiltrates were component of severe ABPA.  IgE was 1267 and ESR of 57.  She was transitioned to PO antibiotics and prednisone 20 mg.  She is to continue antibiotics until complete and remain on 20 mg Prednisone until seen by Dr. Marchelle Gearing for follow up on 1/31. She had transient hypokalemia and hypophosphatemia which was monitored serially and re-pleated.  At time of discharge, she is AAO, ambulatory and ready to return home.       Microbiology/Sepsis markers: 1/10 ESR 57 1/10 IgE 1267 1/9 UA>>>small leuks, 3-6 white cells 1/10 BCx2> neg x 2  1/13 Resp culture> nl flora    Significant Diagnostic Studies:  IMAGING:  CXR 1/12>> IMPRESSION: Improved bilateral patchy airspace disease.  CXR 1/9>> IMPRESSION: Bilateral perihilar bronchopneumonia/pneumonia. Recommend post-treatment radiographs to document resolution.  CT SINUS 1/9>> IMPRESSION: Minimal mucosal disease within the left frontal lateral recess and at the ostium of the right sphenoid sinus. No evidence for acute disease or fluid levels.   Discharge Exam: General appearance: NAD, conversant .  Eyes: anicteric sclerae, moist conjunctivae; no lid-lag; PERRLA  Neck: Trachea midline, supple, no thyromegaly or lymphadenopathy  Lungs: CTA, with normal respiratory effort and no intercostal retractions. No wheeze  CV: RRR, no MRGs  Abdomen: Soft, non-tender; no masses or HSM  Extremities: No peripheral edema or extremity lymphadenopathy  Skin: Normal temperature, turgor and texture; no rash, ulcers or subcutaneous nodules  Psych: Appropriate affect, alert and oriented to person, place and time   Discharge Labs  BMET  Lab 07/02/11 0545 06/30/11 0913 06/30/11 0605 06/29/11 2153 06/29/11 1340  NA 140 -- 135 -- 137  K  4.1 -- 3.1* -- --  CL 106 -- 103 -- 101  CO2 26 -- 23 -- 23  GLUCOSE 161* -- 114* -- 101*  BUN 14 -- 11 -- 13  CREATININE 0.62 -- 0.68 0.72 0.66  CALCIUM 8.9 -- 9.1 -- 9.2  MG 2.4 2.0 -- -- --  PHOS  2.8 1.9* -- -- --     CBC   Lab 07/01/11 2241 06/30/11 0605 06/29/11 2153  HGB 11.9* 12.2 13.2  HCT 35.2* 36.6 39.2  WBC 17.6* 19.3* 18.5*  PLT 312 227 246      Discharge Orders    Future Appointments: Provider: Department: Dept Phone: Center:   07/21/2011 4:00 PM Kalman Shan, MD [facility] [phone] None     Future Orders Please Complete By Expires   Diet - low sodium heart healthy      Increase activity slowly      Call MD for:  temperature >100.4      Call MD for:  difficulty breathing, headache or visual disturbances          Follow-up Information    Follow up with [facility], MD on 07/21/2011. (@ 4pm )    Contact information:   [address] [place] [address] [phone]          DISCHARGE MEDICATIONS   Victoria [name], [place]  Home Medication Instructions FAO:[id number]   Printed on:07/04/11 1119  Medication Information                    Cholecalciferol (VITAMIN D) 2000 UNITS CAPS Take 2,000 Units by mouth.             Omega-3 Fatty Acids (OMEGA 3 PO) Take 1 capsule by mouth daily.            mometasone (ASMANEX) 220 MCG/INH inhaler 2 puffs in evening           albuterol (PROAIR HFA) 108 (90 BASE) MCG/ACT inhaler Inhale 2 puffs into the lungs every 6 (six) hours as needed.             montelukast (SINGULAIR) 10 MG tablet Take 1 tablet (10 mg total) by mouth 2 (two) times daily.           aspirin 81 MG tablet Take 81 mg by mouth daily.            Calcium-Magnesium-Vitamin D (CALCIUM MAGNESIUM PO) Take by mouth daily. shaklee osteomatrix- contains calsium, magnesium, vitamin d, vitamin k, zinc, copper and manganese            Coenzyme Q10 (COQ10 PO) Take 1 tablet by mouth daily.            chlorpheniramine-HYDROcodone (TUSSIONEX PENNKINETIC ER) 10-8 MG/5ML LQCR Take 5 mLs by mouth at bedtime as needed.           Aclidinium Bromide (TUDORZA PRESSAIR) 400 MCG/ACT AEPB Inhale 1 puff into the lungs 2 (two) times daily.            albuterol (PROVENTIL) (2.5 MG/3ML) 0.083% nebulizer solution Take 3 mLs (2.5 mg total) by nebulization every 6 (six) hours as needed.           Omeprazole-Sodium Bicarbonate (ZEGERID) 20-1100 MG CAPS Take 1 capsule by mouth every morning.           tiotropium (SPIRIVA) 18 MCG inhalation capsule Place 18 mcg into inhaler and inhale daily.           venlafaxine (EFFEXOR) 37.5 MG tablet  Take 37.5 mg by mouth 3 (three) times daily. 3 by mouth daily           OVER THE COUNTER MEDICATION Take 1 tablet by mouth daily. Shaklee Immunity           predniSONE (DELTASONE) 10 MG tablet Take 2 tablets (20 mg total) by mouth daily with breakfast.           azithromycin (ZITHROMAX) 500 MG tablet Take 1 tablet (500 mg total) by mouth daily.           moxifloxacin (AVELOX) 400 MG tablet Take 1 tablet (400 mg total) by mouth daily at 2 PM daily at 2 PM.              Disposition:  Discharge to Home / Self Care  Discharged Condition: Victoria [name] has met maximum benefit of inpatient care and is medically stable and cleared for discharge.  Patient is pending follow up as above.      Time spent on disposition:  Greater than 35 minutes.   Signed:   Sandrea Hughs, MD Pulmonary and Critical Care Medicine [facility] Cell [phone]

## 2011-07-04 NOTE — Progress Notes (Signed)
07/04/11 NSG. 1220 Patient discharged to home with family, discharged instructions given and reviewed with patient by Canary Brim, NP and re-inforced by RN.  Patient verbalized understanding. Skin intact at discharge, IV discharged and intact. Patient escorted to car via wheelchair by volunteer service and NT.  Forbes Cellar, RN

## 2011-07-21 ENCOUNTER — Encounter: Payer: Self-pay | Admitting: Internal Medicine

## 2011-07-21 ENCOUNTER — Ambulatory Visit (INDEPENDENT_AMBULATORY_CARE_PROVIDER_SITE_OTHER): Payer: Managed Care, Other (non HMO) | Admitting: Internal Medicine

## 2011-07-21 ENCOUNTER — Ambulatory Visit (INDEPENDENT_AMBULATORY_CARE_PROVIDER_SITE_OTHER)
Admission: RE | Admit: 2011-07-21 | Discharge: 2011-07-21 | Disposition: A | Discharge status: Home / Self Care | Payer: Managed Care, Other (non HMO) | Source: Ambulatory Visit | Attending: Internal Medicine | Admitting: Internal Medicine

## 2011-07-21 VITALS — BP 110/78 | HR 92 | Temp 98.6°F | Ht 63.0 in | Wt 171.4 lb

## 2011-07-21 DIAGNOSIS — J45909 Unspecified asthma, uncomplicated: Secondary | ICD-10-CM

## 2011-07-21 DIAGNOSIS — J189 Pneumonia, unspecified organism: Secondary | ICD-10-CM

## 2011-07-21 DIAGNOSIS — B4481 Allergic bronchopulmonary aspergillosis: Secondary | ICD-10-CM

## 2011-07-21 MED ORDER — ITRACONAZOLE 200 MG PO TABS: 1.0000 | ORAL_TABLET | Freq: Every day | ORAL | Status: DC

## 2011-07-21 NOTE — Progress Notes (Signed)
Subjective:    Patient ID: Victoria [name], female    DOB: 26-Aug-1955, 56 y.o.   MRN: [id number]  HPI Victoria [name] is a 56 y.o. female severe persistent asthma and ABPA.    OV 06/01/2011 Folloowup for above. Saw Dr Craige Cotta on acute basis for [facility]. Had barking cough too at that time. Given pred and antibiotic burst. Now improved but says residual cough lingers. RSI cough score is 12 (level 4 - annoying cough. Level 2 - hoarsenss, difficutlty with foods, and cough after lying down. Level 1 - sensation of lump in throat, and heartburn). Last night choked on food and after that severe heartburn related chest pain that took a while to subside. Reports negative stress test few years ago but is worrid this could be heart related.   Currently on spiriva, prednisone 5mg , singulair, asmanex. However, notes that spiriva capsules are of poor quality and 10% of them are duds. She is interested in trying out new TUDORZA though there is no data on it.   Today 05/31/11 - fev2 1.4L/56%, Ratio 60 which is c/w her stable state  Past, Social, Family: no change   #cough  - current cough appears to be post viral because your lung function is around60% and stable level for you  - let us watch it, if it gets worse or not better call us or visit soon  #chest pain  - suspect due to gerd  - if recurs or bothersome, call your primary care doctor  #Asthma and ABPA  - continue current medication regimen except we will try TUDORZA since you are having issues with spiriva capsule quality  - let us check IgE and CBCD blood work today - depending on results we can revisit allergy or ABPA or xolair question again  #Followup  - 8 weeks   OV 07/21/2011 FU asthma, abpa and recent admit for pneumonia  Since last visit was hospitalzed for pneumonia (Rt side) over xmas holidays. Had high fever and pleuritic pain. It was her first admit in over 15 years or so. Currently back to baseline health and feels stable. Continues on  severe persistent asthma regimen. Reviewed IgE in dec and jan 2013 - both over 1000. No new complaints. Spirometry:  07/21/2011 - fev1 1.63L/65%, Ratio 65 - and this is a very good number for her  CXR today Dg Chest 2 View  07/21/2011  *RADIOLOGY REPORT*  Clinical Data: Follow up pneumonia  CHEST - 2 VIEW  Comparison: 07/02/2011  Findings: Heart size appears normal.  There is no pleural effusion or edema.  Interval improvement in bilateral airspace opacities, left greater than right.  There are coarsened interstitial markings bilaterally compatible with COPD.  IMPRESSION:  1.  Continued improvement in aeration to the lungs.  Original Report Authenticated By: Rosealee Albee, M.D.   Dg Chest 2 View  06/29/2011  *RADIOLOGY REPORT*  Clinical Data: 56 year old female with cough and right chest pain. Fever.  CHEST - 2 VIEW  Comparison: 04/28/2011 earlier.  Findings: Stable lung volumes.  Stable eventration of the left hemidiaphragm.  New patchy bilateral perihilar opacity.  Some apical and lung base scarring is suspected and stable.  Cardiac size and mediastinal contours are within normal limits.  Visualized tracheal air column is within normal limits.  No pleural effusion or pneumothorax. No acute osseous abnormality identified.  IMPRESSION: Bilateral perihilar bronchopneumonia/pneumonia. Recommend post-treatment radiographs to document resolution.  Original Report Authenticated By: Harley Hallmark, M.D.   Dg Chest [facility]  07/02/2011  *RADIOLOGY REPORT*  Clinical Data: Pneumonia  PORTABLE CHEST - 1 VIEW  Comparison: 06/29/2011  Findings: Heart is normal in size.  Bilateral patchy airspace disease improved.  Low volumes.  No pneumothorax.  IMPRESSION: Improved bilateral patchy airspace disease.  Original Report Authenticated By: Donavan Burnet, M.D.   [facility]  06/29/2011  *RADIOLOGY REPORT*  Clinical Data:  Chronic sinusitis with increased drainage. Occasional blood from the base.  CT  LIMITED SINUSES WITHOUT CONTRAST  Technique:  Multidetector CT images of the paranasal sinuses were obtained in a single plane without contrast.  Comparison:  MRI head 08/10/2009.  Findings:  There is minimal mucosal thickening along the left frontoethmoidal recess.  Minimal mucosal thickening is also noted along the ostium to the right sphenoid sinus.  No other significant mucosal disease is evident within the paranasal sinuses.  There are no fluid levels.  The mastoid air cells are clear.  The visualized skull is intact.  Limited imaging of the brain is unremarkable.  IMPRESSION:  1.  Minimal mucosal disease within the left frontal lateral recess and at the ostium of the right sphenoid sinus. 2.  No evidence for acute disease or fluid levels.  Original Report Authenticated By: Jamesetta Orleans. MATTERN, M.D.    Past, Family, Social reviewed: admitted for pneumonia  Review of Systems  Constitutional: Negative for fever and unexpected weight change.  HENT: Negative for ear pain, nosebleeds, congestion, sore throat, rhinorrhea, sneezing, trouble swallowing, dental problem, postnasal drip and sinus pressure.   Eyes: Negative for redness and itching.  Respiratory: Negative for cough, chest tightness, shortness of breath and wheezing.   Cardiovascular: Negative for palpitations and leg swelling.  Gastrointestinal: Negative for nausea and vomiting.  Genitourinary: Negative for dysuria.  Musculoskeletal: Negative for joint swelling.  Skin: Negative for rash.  Neurological: Negative for headaches.  Hematological: Does not bruise/bleed easily.  Psychiatric/Behavioral: Negative for dysphoric mood. The patient is not nervous/anxious.        Objective:   Physical Exam General - Obese HEENT - no sinus tenderness, no oral exudate, no LAN, TM clear Cardiac - s1s2 regular, no murmur Chest - prolonged exhalation, NO WHEEZE )(was wheezing last time) no rales/dullness Abdomen - soft, nontender Extremities -  no e/c/c Skin - no rashes Neurologic - normal strength, CN intact Psychiatric - normal mood, behavior        Assessment & Plan:

## 2011-07-21 NOTE — Patient Instructions (Addendum)
#  cough  - glad it is resolved after recent admission for pneumonia #chest pain and recent pneumonia - get cxr ; will call with results #Asthma and ABPA  - lung function is better  - continue current medication regimen spiriva, twice daily singlair, mometasone, and daily prednisone  - your Ige is > 1000 and suggests active ABPA - so start itraconazole 200mg  daily  - also see allergist Dr Fannie Knee for other options to help you out #Followup -  8 weeks with spirometry

## 2011-07-24 ENCOUNTER — Encounter: Payer: Self-pay | Admitting: Internal Medicine

## 2011-07-24 NOTE — Assessment & Plan Note (Signed)
Will get cxr to document clearance

## 2011-07-24 NOTE — Assessment & Plan Note (Signed)
Given high IgE will restart itraconazole - she was counseled and she agrees Refer Dr Fannie Knee for allergy eval

## 2011-07-24 NOTE — Assessment & Plan Note (Signed)
Stable/severe  Plan Continue severe persistent regimen

## 2011-08-02 ENCOUNTER — Ambulatory Visit: Payer: Managed Care, Other (non HMO) | Admitting: Internal Medicine

## 2011-08-19 ENCOUNTER — Institutional Professional Consult (permissible substitution): Payer: Managed Care, Other (non HMO) | Admitting: Internal Medicine

## 2011-08-25 ENCOUNTER — Ambulatory Visit (INDEPENDENT_AMBULATORY_CARE_PROVIDER_SITE_OTHER)
Admission: RE | Admit: 2011-08-25 | Discharge: 2011-08-25 | Disposition: A | Discharge status: Home / Self Care | Payer: Medicare Other | Source: Ambulatory Visit | Attending: Family Medicine | Admitting: Family Medicine

## 2011-08-25 ENCOUNTER — Ambulatory Visit (INDEPENDENT_AMBULATORY_CARE_PROVIDER_SITE_OTHER): Payer: Medicare Other | Admitting: Family Medicine

## 2011-08-25 ENCOUNTER — Encounter: Payer: Self-pay | Admitting: Family Medicine

## 2011-08-25 VITALS — BP 120/84 | HR 88 | Temp 98.3°F | Wt 172.0 lb

## 2011-08-25 DIAGNOSIS — R5381 Other malaise: Secondary | ICD-10-CM

## 2011-08-25 DIAGNOSIS — E785 Hyperlipidemia, unspecified: Secondary | ICD-10-CM | POA: Diagnosis not present

## 2011-08-25 DIAGNOSIS — J189 Pneumonia, unspecified organism: Secondary | ICD-10-CM

## 2011-08-25 DIAGNOSIS — M549 Dorsalgia, unspecified: Secondary | ICD-10-CM | POA: Diagnosis not present

## 2011-08-25 DIAGNOSIS — Z8701 Personal history of pneumonia (recurrent): Secondary | ICD-10-CM | POA: Diagnosis not present

## 2011-08-25 DIAGNOSIS — R5383 Other fatigue: Secondary | ICD-10-CM

## 2011-08-25 DIAGNOSIS — J984 Other disorders of lung: Secondary | ICD-10-CM | POA: Diagnosis not present

## 2011-08-25 DIAGNOSIS — R079 Chest pain, unspecified: Secondary | ICD-10-CM

## 2011-08-25 LAB — CBC WITH DIFFERENTIAL/PLATELET
Eosinophils Relative: 1.4 % (ref 0.0–5.0)
HCT: 41 % (ref 36.0–46.0)
Hemoglobin: 13.7 g/dL (ref 12.0–15.0)
Lymphs Abs: 0.9 10*3/uL (ref 0.7–4.0)
Monocytes Relative: 2.2 % — ABNORMAL LOW (ref 3.0–12.0)
Neutro Abs: 7.3 10*3/uL (ref 1.4–7.7)
Platelets: 310 10*3/uL (ref 150.0–400.0)
RBC: 4.44 Mil/uL (ref 3.87–5.11)
WBC: 8.6 10*3/uL (ref 4.5–10.5)

## 2011-08-25 LAB — HEPATIC FUNCTION PANEL
ALT: 21 U/L (ref 0–35)
AST: 18 U/L (ref 0–37)
Total Bilirubin: 0.2 mg/dL — ABNORMAL LOW (ref 0.3–1.2)

## 2011-08-25 LAB — BASIC METABOLIC PANEL
GFR: 70.17 mL/min (ref >60.00)
Potassium: 3.8 mEq/L (ref 3.5–5.1)
Sodium: 138 mEq/L (ref 135–145)

## 2011-08-25 NOTE — Patient Instructions (Signed)
Pneumonia, Adult Pneumonia is an infection of the lungs.  CAUSES Pneumonia may be caused by bacteria or a virus. Usually, these infections are caused by breathing infectious particles into the lungs (respiratory tract). SYMPTOMS   Cough.   Fever.   Chest pain.   Increased rate of breathing.   Wheezing.   Mucus production.  DIAGNOSIS  If you have the common symptoms of pneumonia, your caregiver will typically confirm the diagnosis with a chest X-ray. The X-ray will show an abnormality in the lung (pulmonary infiltrate) if you have pneumonia. Other tests of your blood, urine, or sputum may be done to find the specific cause of your pneumonia. Your caregiver may also do tests (blood gases or pulse oximetry) to see how well your lungs are working. TREATMENT  Some forms of pneumonia may be spread to other people when you cough or sneeze. You may be asked to wear a mask before and during your exam. Pneumonia that is caused by bacteria is treated with antibiotic medicine. Pneumonia that is caused by the influenza virus may be treated with an antiviral medicine. Most other viral infections must run their course. These infections will not respond to antibiotics.  PREVENTION A pneumococcal shot (vaccine) is available to prevent a common bacterial cause of pneumonia. This is usually suggested for:  People over 65 years old.   Patients on chemotherapy.   People with chronic lung problems, such as bronchitis or emphysema.   People with immune system problems.  If you are over 65 or have a high risk condition, you may receive the pneumococcal vaccine if you have not received it before. In some countries, a routine influenza vaccine is also recommended. This vaccine can help prevent some cases of pneumonia.You may be offered the influenza vaccine as part of your care. If you smoke, it is time to quit. You may receive instructions on how to stop smoking. Your caregiver can provide medicines and  counseling to help you quit. HOME CARE INSTRUCTIONS   Cough suppressants may be used if you are losing too much rest. However, coughing protects you by clearing your lungs. You should avoid using cough suppressants if you can.   Your caregiver may have prescribed medicine if he or she thinks your pneumonia is caused by a bacteria or influenza. Finish your medicine even if you start to feel better.   Your caregiver may also prescribe an expectorant. This loosens the mucus to be coughed up.   Only take over-the-counter or prescription medicines for pain, discomfort, or fever as directed by your caregiver.   Do not smoke. Smoking is a common cause of bronchitis and can contribute to pneumonia. If you are a smoker and continue to smoke, your cough may last several weeks after your pneumonia has cleared.   A cold steam vaporizer or humidifier in your room or home may help loosen mucus.   Coughing is often worse at night. Sleeping in a semi-upright position in a recliner or using a couple pillows under your head will help with this.   Get rest as you feel it is needed. Your body will usually let you know when you need to rest.  SEEK IMMEDIATE MEDICAL CARE IF:   Your illness becomes worse. This is especially true if you are elderly or weakened from any other disease.   You cannot control your cough with suppressants and are losing sleep.   You begin coughing up blood.   You develop pain which is getting worse or   is uncontrolled with medicines.   You have a fever.   Any of the symptoms which initially brought you in for treatment are getting worse rather than better.   You develop shortness of breath or chest pain.  MAKE SURE YOU:   Understand these instructions.   Will watch your condition.   Will get help right away if you are not doing well or get worse.  Document Released: 06/06/2005 Document Revised: 05/26/2011 Document Reviewed: 08/26/2010 [facility] Patient Information 2012  [place], [place].  Fatigue Fatigue is a feeling of tiredness, lack of energy, lack of motivation, or feeling tired all the time. Having enough rest, good nutrition, and reducing stress will normally reduce fatigue. Consult your caregiver if it persists. The nature of your fatigue will help your caregiver to find out its cause. The treatment is based on the cause.  CAUSES  There are many causes for fatigue. Most of the time, fatigue can be traced to one or more of your habits or routines. Most causes fit into one or more of three general areas. They are: Lifestyle problems  Sleep disturbances.   Overwork.   Physical exertion.   Unhealthy habits.   Poor eating habits or eating disorders.   Alcohol and/or drug use .   Lack of proper nutrition (malnutrition).  Psychological problems  Stress and/or anxiety problems.   Depression.   Grief.   Boredom.  Medical Problems or Conditions  Anemia.   Pregnancy.   Thyroid gland problems.   Recovery from major surgery.   Continuous pain.   Emphysema or asthma that is not well controlled   Allergic conditions.   Diabetes.   Infections (such as mononucleosis).   Obesity.   Sleep disorders, such as sleep apnea.   Heart failure or other heart-related problems.   Cancer.   Kidney disease.   Liver disease.   Effects of certain medicines such as antihistamines, cough and cold remedies, prescription pain medicines, heart and blood pressure medicines, drugs used for treatment of cancer, and some antidepressants.  SYMPTOMS  The symptoms of fatigue include:   Lack of energy.   Lack of drive (motivation).   Drowsiness.   Feeling of indifference to the surroundings.  DIAGNOSIS  The details of how you feel help guide your caregiver in finding out what is causing the fatigue. You will be asked about your present and past health condition. It is important to review all medicines that you take, including prescription and  non-prescription items. A thorough exam will be done. You will be questioned about your feelings, habits, and normal lifestyle. Your caregiver may suggest blood tests, urine tests, or other tests to look for common medical causes of fatigue.  TREATMENT  Fatigue is treated by correcting the underlying cause. For example, if you have continuous pain or depression, treating these causes will improve how you feel. Similarly, adjusting the dose of certain medicines will help in reducing fatigue.  HOME CARE INSTRUCTIONS   Try to get the required amount of good sleep every night.   Eat a healthy and nutritious diet, and drink enough water throughout the day.   Practice ways of relaxing (including yoga or meditation).   Exercise regularly.   Make plans to change situations that cause stress. Act on those plans so that stresses decrease over time. Keep your work and personal routine reasonable.   Avoid street drugs and minimize use of alcohol.   Start taking a daily multivitamin after consulting your caregiver.  SEEK MEDICAL CARE  IF:   You have persistent tiredness, which cannot be accounted for.   You have fever.   You have unintentional weight loss.   You have headaches.   You have disturbed sleep throughout the night.   You are feeling sad.   You have constipation.   You have dry skin.   You have gained weight.   You are taking any new or different medicines that you suspect are causing fatigue.   You are unable to sleep at night.   You develop any unusual swelling of your legs or other parts of your body.  SEEK IMMEDIATE MEDICAL CARE IF:   You are feeling confused.   Your vision is blurred.   You feel faint or pass out.   You develop severe headache.   You develop severe abdominal, pelvic, or back pain.   You develop chest pain, shortness of breath, or an irregular or fast heartbeat.   You are unable to pass a normal amount of urine.   You develop abnormal  bleeding such as bleeding from the rectum or you vomit blood.   You have thoughts about harming yourself or committing suicide.   You are worried that you might harm someone else.  MAKE SURE YOU:   Understand these instructions.   Will watch your condition.   Will get help right away if you are not doing well or get worse.  Document Released: 04/03/2007 Document Revised: 05/26/2011 Document Reviewed: 04/03/2007 [facility] Patient Information 2012 [place], [place].

## 2011-08-25 NOTE — Progress Notes (Signed)
  Subjective:    Patient ID: Victoria [name], female    DOB: 1955/11/02, 56 y.o.   MRN: [id number]  HPI  Pt here c/o "just feeling bad" since having pneumonia. Pt still has chest tightness and is still on prednisone.  Pt has an appointment with pulmonary next week.   Review of Systems As above    Filed Vitals:   08/25/11 1350  BP: 120/84  Pulse: 88  Temp: 98.3 F (36.8 C)  TempSrc: Oral  Weight: 172 lb (78.019 kg)  SpO2: 94%  after walking PO 88% and then came up to 98% Objective:   Physical Exam  Constitutional: She is oriented to person, place, and time. She appears well-developed and well-nourished.  Neck: Normal range of motion. Neck supple.  Cardiovascular: Normal rate and regular rhythm.   Pulmonary/Chest: Breath sounds normal. She has no wheezes. She has no rales. She exhibits no tenderness.       + sob with exertion  Abdominal: Soft. Bowel sounds are normal.  Neurological: She is alert and oriented to person, place, and time.  Psychiatric: She has a normal mood and affect. Her behavior is normal. Judgment and thought content normal.          Assessment & Plan:  Hypoxia-- hx pneumonia,                     ONO to be done                   Recheck xray               Keep pulm appointment               con't all meds

## 2011-09-01 ENCOUNTER — Ambulatory Visit (INDEPENDENT_AMBULATORY_CARE_PROVIDER_SITE_OTHER): Payer: Medicare Other | Admitting: Internal Medicine

## 2011-09-01 ENCOUNTER — Encounter: Payer: Self-pay | Admitting: Internal Medicine

## 2011-09-01 DIAGNOSIS — R5381 Other malaise: Secondary | ICD-10-CM

## 2011-09-01 DIAGNOSIS — J45909 Unspecified asthma, uncomplicated: Secondary | ICD-10-CM

## 2011-09-01 DIAGNOSIS — R5383 Other fatigue: Secondary | ICD-10-CM | POA: Insufficient documentation

## 2011-09-01 DIAGNOSIS — J189 Pneumonia, unspecified organism: Secondary | ICD-10-CM | POA: Diagnosis not present

## 2011-09-01 DIAGNOSIS — B4481 Allergic bronchopulmonary aspergillosis: Secondary | ICD-10-CM

## 2011-09-01 DIAGNOSIS — R0789 Other chest pain: Secondary | ICD-10-CM | POA: Diagnosis not present

## 2011-09-01 NOTE — Assessment & Plan Note (Signed)
Continue current cocktail of medications Keep up appt with DR Maple Hudson for allergy eval

## 2011-09-01 NOTE — Patient Instructions (Signed)
#  chest pain  - glad is resolved. Do not know what that was. If happens again, call us or come soon #Fatigue  - we will do order for TSH and Vit D check, do it with your next fasting blood sguar check - probably related to pneumonia  - encourage you to start working out now that the pneumonia has cleared up on xray #allergies  - keep up appt with Dr Maple Hudson #Asthma and ABPA    Continue your medications #Followup   - 3months or sooner if needed

## 2011-09-01 NOTE — Assessment & Plan Note (Signed)
This  Is most likely due to post pneumonia and illness fatigue. Basic labs normal. Will check TSH and Vit D as well. Advised rehab v home exercises. She plans home exercises. Will continue to monitor

## 2011-09-01 NOTE — Assessment & Plan Note (Signed)
Continue itraconazole

## 2011-09-01 NOTE — Assessment & Plan Note (Signed)
Unclear etiology. Is now resolved. ? Musculoskeletal. No desaturation today to warrant PE. Other than age she is negative for all PERC criteria for PE. Will continue to monitor clinically

## 2011-09-01 NOTE — Assessment & Plan Note (Signed)
Has cleared up on August 25, 2011 cxr. Expectant followup

## 2011-09-01 NOTE — Progress Notes (Signed)
Subjective:    Patient ID: Victoria [name], female    DOB: 09-03-1955, 56 y.o.   MRN: [id number]  HPI Victoria [name] is a 56 y.o. female severe persistent asthma and ABPA.    OV 07/21/2011 FU severe persistent asthma, abpa (restart itraconazole jan 2013)and xmas 2012 admit for RT pneumonia    - IgE in dec and jan 2013 - both over 1000. - Spirometry: 07/21/2011 - fev1 1.63L/65%, Ratio 65 - and this is a very good number for her - Ct Maxillofacial Ltd Wo Cm" 06/29/2011   Minimal mucosal disease within the left frontal lateral recess and at the ostium of the right sphenoid sinus. 2.  No evidence for acute disease or fluid levels.     OV 09/01/2011 On itraconazole since last visit in Jan 2013. Tolerating fine.  Due to see Dr young 09/22/11 for allergies.  Asthma is well controlled on curent regimen highlighted below  Today visiting me earlier than usual. Says since pneummonia around xmas 2012 having fatigue and tiredness. Then 2 weeks ago developed pleurtic pain in rt parasternal area radiating to back. Saw Dr Laury Axon. Says she desaturated during walk test there. CXR, cbc and bmet normal (noted that last tSH and Vit D was in 2011 and normal). Pain resolved in few days. Now just with lot of fatigue. Says from asthma stand point feels well. Today, Fev1 1.53L/61%, ratio 63 and walking desaturation 185 feet x 3 laps Denies travel, edema, dyspnea, wheeze or hemoptysis or hormone use   Current outpatient prescriptions:albuterol (PROAIR HFA) 108 (90 BASE) MCG/ACT inhaler, Inhale 2 puffs into the lungs every 6 (six) hours as needed.  , Disp: , Rfl: ;  albuterol (PROVENTIL) (2.5 MG/3ML) 0.083% nebulizer solution, Take 3 mLs (2.5 mg total) by nebulization every 6 (six) hours as needed., Disp: 75 mL, Rfl: 12;  aspirin 81 MG tablet, Take 81 mg by mouth daily. , Disp: , Rfl:  Calcium-Magnesium-Vitamin D (CALCIUM MAGNESIUM PO), Take by mouth daily. shaklee osteomatrix- contains calsium, magnesium, vitamin d, vitamin k, zinc,  copper and manganese , Disp: , Rfl: ;  chlorpheniramine-HYDROcodone (TUSSIONEX PENNKINETIC ER) 10-8 MG/5ML LQCR, Take 5 mLs by mouth at bedtime as needed., Disp: 120 mL, Rfl: 0;  Cholecalciferol (VITAMIN D) 2000 UNITS CAPS, Take 2,000 Units by mouth.  , Disp: , Rfl:  Coenzyme Q10 (COQ10 PO), Take 1 tablet by mouth daily. , Disp: , Rfl: ;  Itraconazole 200 MG TABS, Take 1 tablet by mouth daily., Disp: 30 tablet, Rfl: 6;  mometasone (ASMANEX) 220 MCG/INH inhaler, Inhale 2 puffs into the lungs daily., Disp: , Rfl: ;  montelukast (SINGULAIR) 10 MG tablet, Take 1 tablet (10 mg total) by mouth 2 (two) times daily., Disp: 180 tablet, Rfl: 4 Multiple Vitamin (MULTI-VITAMIN DAILY PO), Take 2 tablets by mouth daily., Disp: , Rfl: ;  Omega-3 Fatty Acids (OMEGA 3 PO), Take 1 capsule by mouth daily. , Disp: , Rfl: ;  Omeprazole-Sodium Bicarbonate (ZEGERID) 20-1100 MG CAPS, Take 1 capsule by mouth every morning., Disp: 30 each, Rfl: 5;  predniSONE (DELTASONE) 10 MG tablet, Take 10 mg by mouth daily., Disp: , Rfl:  tiotropium (SPIRIVA) 18 MCG inhalation capsule, Place 18 mcg into inhaler and inhale daily., Disp: , Rfl: ;  venlafaxine (EFFEXOR) 37.5 MG tablet, Take 37.5 mg by mouth 3 (three) times daily. 3 by mouth daily, Disp: , Rfl: ;  OVER THE COUNTER MEDICATION, Take 1 tablet by mouth daily. Shaklee Immunity, Disp: , Rfl:      Review  of Systems  Constitutional: Positive for fatigue. Negative for fever and unexpected weight change.  HENT: Negative for ear pain, nosebleeds, congestion, sore throat, rhinorrhea, sneezing, trouble swallowing, dental problem, postnasal drip and sinus pressure.   Eyes: Negative for redness and itching.  Respiratory: Negative for cough, chest tightness, shortness of breath and wheezing.   Cardiovascular: Negative for palpitations and leg swelling.  Gastrointestinal: Negative for nausea and vomiting.  Genitourinary: Negative for dysuria.  Musculoskeletal: Negative for joint swelling.    Skin: Negative for rash.  Neurological: Negative for headaches.  Hematological: Does not bruise/bleed easily.  Psychiatric/Behavioral: Negative for dysphoric mood. The patient is not nervous/anxious.        Objective:   Physical Exam General - Obese HEENT - no sinus tenderness, no oral exudate, no LAN, TM clear Cardiac - s1s2 regular, no murmur Chest - prolonged exhalation, NO WHEEZE )(was wheezing last time) no rales/dullness Abdomen - soft, nontender Extremities - no e/c/c Skin - no rashes Neurologic - normal strength, CN intact Psychiatric - normal mood, behavior       Assessment & Plan:

## 2011-09-07 ENCOUNTER — Other Ambulatory Visit (INDEPENDENT_AMBULATORY_CARE_PROVIDER_SITE_OTHER): Payer: Medicare Other

## 2011-09-07 DIAGNOSIS — E785 Hyperlipidemia, unspecified: Secondary | ICD-10-CM | POA: Diagnosis not present

## 2011-09-07 DIAGNOSIS — R5381 Other malaise: Secondary | ICD-10-CM | POA: Diagnosis not present

## 2011-09-07 DIAGNOSIS — R5383 Other fatigue: Secondary | ICD-10-CM

## 2011-09-07 LAB — LIPID PANEL
HDL: 56.2 mg/dL (ref >39.00)
Total CHOL/HDL Ratio: 5
VLDL: 29 mg/dL (ref 0.0–40.0)

## 2011-09-07 LAB — LDL CHOLESTEROL, DIRECT: Direct LDL: 177.8 mg/dL

## 2011-09-08 LAB — VITAMIN D 25 HYDROXY (VIT D DEFICIENCY, FRACTURES): Vit D, 25-Hydroxy: 53 ng/mL (ref 30–89)

## 2011-09-12 ENCOUNTER — Other Ambulatory Visit: Payer: Self-pay | Admitting: *Deleted

## 2011-09-12 DIAGNOSIS — E785 Hyperlipidemia, unspecified: Secondary | ICD-10-CM

## 2011-09-15 ENCOUNTER — Ambulatory Visit: Payer: Managed Care, Other (non HMO) | Admitting: Internal Medicine

## 2011-09-19 ENCOUNTER — Ambulatory Visit: Payer: Medicare Other | Admitting: Pharmacist

## 2011-09-22 ENCOUNTER — Ambulatory Visit (INDEPENDENT_AMBULATORY_CARE_PROVIDER_SITE_OTHER): Payer: Medicare Other | Admitting: Pharmacist

## 2011-09-22 ENCOUNTER — Encounter: Payer: Self-pay | Admitting: Internal Medicine

## 2011-09-22 ENCOUNTER — Other Ambulatory Visit (INDEPENDENT_AMBULATORY_CARE_PROVIDER_SITE_OTHER): Payer: Medicare Other

## 2011-09-22 ENCOUNTER — Ambulatory Visit (INDEPENDENT_AMBULATORY_CARE_PROVIDER_SITE_OTHER): Payer: Medicare Other | Admitting: Internal Medicine

## 2011-09-22 VITALS — BP 118/78 | HR 98 | Ht 63.0 in | Wt 171.2 lb

## 2011-09-22 VITALS — BP 126/80 | Wt 169.0 lb

## 2011-09-22 DIAGNOSIS — J45909 Unspecified asthma, uncomplicated: Secondary | ICD-10-CM | POA: Diagnosis not present

## 2011-09-22 DIAGNOSIS — E785 Hyperlipidemia, unspecified: Secondary | ICD-10-CM | POA: Diagnosis not present

## 2011-09-22 DIAGNOSIS — B4481 Allergic bronchopulmonary aspergillosis: Secondary | ICD-10-CM | POA: Diagnosis not present

## 2011-09-22 MED ORDER — ROSUVASTATIN CALCIUM 20 MG PO TABS: 10.0000 mg | ORAL_TABLET | ORAL | Status: DC

## 2011-09-22 NOTE — Progress Notes (Signed)
Subjective -  Victoria [name] presented to clinic today in good spirits for an initial visit to discuss her lipids.  Her treatment history includes simvastatin and lipitor, both of which caused muscle aches and "restless legs" at night, and Livalo, which was too costly for her (tier 3 copay).  She is apprehensive to try another statin due to the side effects in the past.    Diet - She reports trying to maintain a healthy diet.  Breakfast is usually whole grain toast or oat bran, lunch is stir fry, salad or fruit/yogurt, & dinner is fish or chicken, brown rice and a vegetable.  She avoids fried foods and fast food meals.  Exercise - She has an extensive history of asthma and immunogenic/allergic reactive airway disease that limits her physical activity.  She has also had numerous members of her family pass away in the last year, and cares for a disabled son (cerebral palsy), which has made it difficult to find time for herself.  During the summer months, she does laps in her pool.  Lipid Panel     Component Value Date/Time   CHOL 256* 09/07/2011 0945   TRIG 145.0 09/07/2011 0945   HDL 56.20 09/07/2011 0945   CHOLHDL 5 09/07/2011 0945   VLDL 29.0 09/07/2011 0945   LDLCALC  Value: 143        Total Cholesterol/HDL:CHD Risk Coronary Heart Disease Risk Table                     Men   Women  1/2 Average Risk   3.4   3.3  Average Risk       5.0   4.4  2 X Average Risk   9.6   7.1  3 X Average Risk  23.4   11.0        Use the calculated Patient Ratio above and the CHD Risk Table to determine the patient's CHD Risk.        ATP III CLASSIFICATION (LDL):  <100     mg/dL   Optimal  409-811  mg/dL   Near or Above                    Optimal  130-159  mg/dL   Borderline  914-782  mg/dL   High  >956     mg/dL   Very High* 07/21/3084 5784       LDL-direct  177      09/07/2011  [facility] (09/30/2010) TC - 191 dLDL - 112 sdLDL - 19 TG - 696 ApoB - 94 Lp(a) - <15 HDL - 61 LpPLA2 - 200 hsCRP - 7.9 HbA1c -  5.6  Filed Vitals:   09/22/11 1313  BP: 126/80   Current Outpatient Prescriptions  Medication Sig Dispense Refill  . albuterol (PROAIR HFA) 108 (90 BASE) MCG/ACT inhaler Inhale 2 puffs into the lungs every 6 (six) hours as needed.        Marland Kitchen albuterol (PROVENTIL) (2.5 MG/3ML) 0.083% nebulizer solution Take 3 mLs (2.5 mg total) by nebulization every 6 (six) hours as needed.  75 mL  12  . aspirin 81 MG tablet Take 81 mg by mouth daily.       . Calcium-Magnesium-Vitamin D (CALCIUM MAGNESIUM PO) Take by mouth daily. shaklee osteomatrix- contains calsium, magnesium, vitamin d, vitamin k, zinc, copper and manganese       . chlorpheniramine-HYDROcodone (TUSSIONEX PENNKINETIC ER) 10-8 MG/5ML LQCR Take 5 mLs by mouth at bedtime  as needed.  120 mL  0  . Cholecalciferol (VITAMIN D) 2000 UNITS CAPS Take 2,000 Units by mouth.        . Coenzyme Q10 (COQ10 PO) Take 1 tablet by mouth daily.       . Itraconazole 200 MG TABS Take 1 tablet by mouth daily.  30 tablet  6  . mometasone (ASMANEX) 220 MCG/INH inhaler Inhale 2 puffs into the lungs daily.      . montelukast (SINGULAIR) 10 MG tablet Take 1 tablet (10 mg total) by mouth 2 (two) times daily.  180 tablet  4  . Multiple Vitamin (MULTI-VITAMIN DAILY PO) Take 2 tablets by mouth daily.      . Omega-3 Fatty Acids (OMEGA 3 PO) Take 1 capsule by mouth daily.       Maxwell Caul Bicarbonate (ZEGERID) 20-1100 MG CAPS Take 1 capsule by mouth every morning.  30 each  5  . PATADAY 0.2 % SOLN       . predniSONE (DELTASONE) 10 MG tablet Take 5 mg by mouth daily.       Marland Kitchen tiotropium (SPIRIVA) 18 MCG inhalation capsule Place 18 mcg into inhaler and inhale daily.      Marland Kitchen venlafaxine (EFFEXOR) 37.5 MG tablet Take 37.5 mg by mouth 3 (three) times daily. 3 by mouth daily

## 2011-09-22 NOTE — Patient Instructions (Signed)
Order-  Lab- Allergy Profile,                                Dx Allergic asthma          Food IgE Allergy Profile  The nurses will work with you to schedule a return for allergy skin testing. Antihistamines can block these skin tests. For 3 days before your return, please don't take any antihistamines including otc cough, cold or sleep meds. Please call if you aren't sure about something you take. Singulair is ok.

## 2011-09-22 NOTE — Patient Instructions (Signed)
It was good to meet you today.  1. Continue to maintain a heart healthy diet (high fiber, low fat, lots of fruits/vegetables) 2. Stay as physically active as possible 3. Start taking Crestor 10mg  every Monday, Wednesday, & Friday 4. Call us at [phone]) if you experience any side effects. 5. Follow-up for labwork and office visit in 2 months (we will call you for an appointment).

## 2011-09-22 NOTE — Progress Notes (Signed)
09/22/11- HPI Referred by Dr Berta Minor Kashani is a 56 y.o. female severe persistent asthma and ABPA.  OV 07/21/2011 -Review of Dr Charlean Sanfilippo notes: FU severe persistent asthma, abpa (restart itraconazole jan 2013)and xmas 2012 admit for RT pneumonia  - IgE in dec and jan 2013- both over 1000.  - Spirometry: 07/21/2011 - fev1 1.63L/65%, Ratio 65 - and this is a very good number for her  - Ct Maxillofacial Ltd Wo Cm" 06/29/2011 Minimal mucosal disease within the left frontal lateral recess and at the ostium of the right sphenoid sinus. 2. No evidence for acute disease or fluid levels.  OV 09/01/2011  On itraconazole since last visit in Jan 2013. Tolerating fine. Due to see Dr  09/22/11 for allergies. Asthma is well controlled on curent regimen highlighted below  Lifelong asthma since infancy with no history of seasonal allergic rhinitis. Several hospitalizations but never ventilator. On prednisone much of her life. Singulair has made a big difference. She takes 2 tablets daily. Frequent prednisone bursts still. Allergy skin testing was positive in the remote past and she was on allergy vaccine as a child. Cost was a problem as an adult. Chronic markedly elevated IgE levels documented. 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 She stopped it for one week while dealing with a GI virus which was also in the family. Maintained prednisone 10 mg daily but this week dropped to 5 mg daily. Treats sinus headache w/ daily saline rinse. Likes to garden including working with [employer]. Sulfites cause wheezing. Denies problems with latex, contrast dye or aspirin. No food sensitivity or urticaria. Moved here from [place] in 2004. Pulmonologist to [place] had found total IgE 400. Initially treated here by Dr.Bardelas and was on Xolair for 6-12 months, but recognized little benefit.  Prior to Admission medications   Medication Sig Start Date End Date  Taking? Authorizing Provider  albuterol (PROAIR HFA) 108 (90 BASE) MCG/ACT inhaler Inhale 2 puffs into the lungs every 6 (six) hours as needed.     Yes Historical Provider, MD  albuterol (PROVENTIL) (2.5 MG/3ML) 0.083% nebulizer solution Take 3 mLs (2.5 mg total) by nebulization every 6 (six) hours as needed. 06/07/11 06/06/12 Yes Kalman Shan, MD  aspirin 81 MG tablet Take 81 mg by mouth daily.    Yes Historical Provider, MD  Calcium-Magnesium-Vitamin D (CALCIUM MAGNESIUM PO) Take by mouth daily. shaklee osteomatrix- contains calsium, magnesium, vitamin d, vitamin k, zinc, copper and manganese    Yes Historical Provider, MD  chlorpheniramine-HYDROcodone (TUSSIONEX PENNKINETIC ER) 10-8 MG/5ML LQCR Take 5 mLs by mouth at bedtime as needed. 04/28/11 04/27/12 Yes Coralyn Helling, MD  Cholecalciferol (VITAMIN D) 2000 UNITS CAPS Take 2,000 Units by mouth.     Yes Historical Provider, MD  Coenzyme Q10 (COQ10 PO) Take 1 tablet by mouth daily.    Yes Historical Provider, MD  Itraconazole 200 MG TABS Take 1 tablet by mouth daily. 07/21/11  Yes Kalman Shan, MD  mometasone (ASMANEX) 220 MCG/INH inhaler Inhale 2 puffs into the lungs daily.   Yes Historical Provider, MD  montelukast (SINGULAIR) 10 MG tablet Take 1 tablet (10 mg total) by mouth 2 (two) times daily. 01/11/11 01/11/12 Yes Kalman Shan, MD  Multiple Vitamin (MULTI-VITAMIN DAILY PO) Take 2 tablets by mouth daily. 08/30/11  Yes Historical Provider, MD  Omega-3 Fatty Acids (OMEGA 3 PO) Take 1 capsule by mouth daily.    Yes Historical Provider, MD  Omeprazole-Sodium Bicarbonate (ZEGERID) 20-1100 MG CAPS  Take 1 capsule by mouth every morning. 06/27/11 06/26/12 Yes Kalman Shan, MD  PATADAY 0.2 % SOLN  09/14/11  Yes Historical Provider, MD  predniSONE (DELTASONE) 10 MG tablet Take 5 mg by mouth daily.    Yes Historical Provider, MD  tiotropium (SPIRIVA) 18 MCG inhalation capsule Place 18 mcg into inhaler and inhale daily.   Yes Historical Provider, MD    venlafaxine (EFFEXOR) 37.5 MG tablet Take 37.5 mg by mouth 3 (three) times daily. 3 by mouth daily 10/21/10  Yes Lelon Perla, DO  rosuvastatin (CRESTOR) 20 MG tablet Take 0.5 tablets (10 mg total) by mouth every Monday, Wednesday, and Friday. 09/22/11 09/21/12  Lelon Perla, DO   Past Medical History  Diagnosis Date  . Asthma   . Obesity   . Maxillary sinusitis   . Depression   . Osteoarthritis   . IBS (irritable bowel syndrome)   . Hyperlipidemia   . COPD (chronic obstructive pulmonary disease)   . Pulmonary nodules   . OSA (obstructive sleep apnea)     Mod OSA by hx in 2007.  AHI 22.5 02/2606  . Osteoporosis   . Nasal congestion 9/12    current cold  . Cough   . Wheezing    Family History  Problem Relation Age of Onset  . Breast cancer Mother   . Cancer Mother 79    breast that metasticized to liver  . Prostate cancer Father   . Diverticulosis Father   . Cancer Father 72    prostate   . Pulmonary embolism Brother    History   Social History  . Marital Status: Married    Spouse Name: N/A    Number of Children: 1  . Years of Education: N/A   Occupational History  .     Social History Main Topics  . Smoking status: Never Smoker   . Smokeless tobacco: Never Used  . Alcohol Use: 2.4 oz/week    2 Glasses of wine, 2 Cans of beer per week  . Drug Use: No  . Sexually Active: Not on file   Other Topics Concern  . Not on file   Social History Narrative  . No narrative on file   Environment. -Homemaker. Lives in 55 yo house, hardwood,no carpet or curtains. Dust precautions. No pets. Crawl space. Denies dampness/ mold.   ROS-see HPI Constitutional:   No-   weight loss, night sweats, fevers, chills, fatigue, lassitude. HEENT:   No-  headaches, difficulty swallowing, tooth/dental problems, sore throat,       No-  sneezing, itching, ear ache, nasal congestion, post nasal drip,  CV:  No-   chest pain, orthopnea, PND, swelling in lower extremities, anasarca, dizziness,  palpitations Resp: No-   shortness of breath with exertion or at rest.              No-   productive cough,  No non-productive cough,  No- coughing up of blood.              No-   change in color of mucus.  No-recent wheezing.   Skin: No-   rash or lesions. GI:  No-   heartburn, indigestion, abdominal pain, nausea, vomiting,  GU:  MS:  No-   joint pain or swelling.  Neuro-     nothing unusual Psych:  No- change in mood or affect. No depression or anxiety.  No memory loss.  OBJ- Physical Exam General- Alert, Oriented, Affect-appropriate, Distress- none acute, overweight Skin-  rash-none, lesions- none, excoriation- none Lymphadenopathy- none Head- atraumatic            Eyes- Gross vision intact, PERRLA, conjunctivae and secretions clear            Ears- Hearing, canals-normal            Nose- Clear, no-Septal dev, mucus, polyps, erosion, perforation             Throat- Mallampati II-III , mucosa clear , drainage- none, tonsils- atrophic Neck- flexible , trachea midline, no stridor , thyroid nl, carotid no bruit Chest - symmetrical excursion , unlabored           Heart/CV- RRR , no murmur , no gallop  , no rub, nl s1 s2                           - JVD- none , edema- none, stasis changes- none, varices- none           Lung- clear to P&A, wheeze- none, + deep breath->wheezey cough , dullness-none, rub- none           Chest wall-  Abd- tender-no, distended-no, bowel sounds-present, HSM- no Br/ Gen/ Rectal- Not done, not indicated Extrem- cyanosis- none, clubbing, none, atrophy- none, strength- nl Neuro- grossly intact to observation

## 2011-09-22 NOTE — Assessment & Plan Note (Signed)
CV Risk Assessment: Risk Factors: age? Positive Factors: never smoker, no family history, high HDL, no HTN LDL goal <130-160, HDL goal >45, TG goal <150  Recommendations/Plan: 1. Continue to maintain a heart healthy diet (high fiber, low fat, lots of fruits/vegetables) 2. Stay as physically active as possible 3. Start taking Crestor 10mg  every Monday, Wednesday, & Friday 4. Call us at [phone]) if you experience any side effects. 5. Follow-up for labwork and office visit in 2 months (we will call you for an appointment).

## 2011-10-01 NOTE — Assessment & Plan Note (Signed)
Documentation reviewed. Now on itraconazole and maintenance prednisone.

## 2011-10-01 NOTE — Assessment & Plan Note (Signed)
Had qualified for, but failed to improve with Xolair by Dr. Beaulah Dinning in the past Plan-allergy profile and food IgE profile begin reassessment of reaginic/ IgE pathway significance.

## 2011-10-19 ENCOUNTER — Ambulatory Visit (INDEPENDENT_AMBULATORY_CARE_PROVIDER_SITE_OTHER): Payer: Medicare Other | Admitting: Internal Medicine

## 2011-10-19 ENCOUNTER — Encounter: Payer: Self-pay | Admitting: Internal Medicine

## 2011-10-19 VITALS — BP 118/74 | HR 95 | Ht 63.0 in | Wt 172.0 lb

## 2011-10-19 DIAGNOSIS — J45909 Unspecified asthma, uncomplicated: Secondary | ICD-10-CM

## 2011-10-19 DIAGNOSIS — B4481 Allergic bronchopulmonary aspergillosis: Secondary | ICD-10-CM | POA: Diagnosis not present

## 2011-10-19 DIAGNOSIS — J309 Allergic rhinitis, unspecified: Secondary | ICD-10-CM

## 2011-10-19 DIAGNOSIS — J3089 Other allergic rhinitis: Secondary | ICD-10-CM

## 2011-10-19 NOTE — Progress Notes (Signed)
09/22/11- HPI Referred by Dr Marchelle Gearing- 56 y.o. female severe persistent asthma and ABPA.  OV 07/21/2011 -Review of Dr Charlean Sanfilippo notes: FU severe persistent asthma, abpa (restart itraconazole jan 2013)and xmas 2012 admit for RT pneumonia  - IgE in dec and jan 2013- both over 1000.  - Spirometry: 07/21/2011 - fev1 1.63L/65%, Ratio 65 - and this is a very good number for her  - Ct Maxillofacial Ltd Wo Cm" 06/29/2011 Minimal mucosal disease within the left frontal lateral recess and at the ostium of the right sphenoid sinus. 2. No evidence for acute disease or fluid levels.  OV 09/01/2011  On itraconazole since last visit in Jan 2013. Tolerating fine. Due to see Dr Dominyk Law 09/22/11 for allergies. Asthma is well controlled on curent regimen highlighted below Lifelong asthma since infancy with no history of seasonal allergic rhinitis. Several hospitalizations but never ventilator. On prednisone much of her life. Singulair has made a big difference. She takes 2 tablets daily. Frequent prednisone bursts still. Allergy skin testing was positive in the remote past and she was on allergy vaccine as a child. Cost was a problem as an adult. Chronic markedly elevated IgE levels documented. 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 Victoria [name] here from [facility] in 2004. Pulmonologist to [place] had found total IgE 400. Initially treated here by Dr.Bardelas and was on Xolair for 6-12 months, but recognized little benefit. Environment. -Homemaker. Lives in 55 yo house, hardwood,no carpet or curtains. Dust precautions. No pets. Crawl space. Denies dampness/ mold.   10/19/11- 20 y.o. f never smoker followed for severe persistent asthma and ABPA.  Husband ha.6, d a cold. She got mild chest congestion and wheeze while in [place] last week. Question photosensitization by itraconazole  being taken for her ABPA. Scant light brown sputum. Frontal headache. Allergy Profile 09/22/11-  Total IgE 575, broad elevations for several molds. Allergy Skin Tests- significant positives for some grass and weed pollens, mixed house dust, tobacco and especially for several molds including aspergillus.  ROS-see HPI Constitutional:   No-   weight loss, night sweats, fevers, chills, fatigue, lassitude. HEENT:   + headaches, difficulty swallowing, tooth/dental problems, sore throat,       No-  sneezing, itching, ear ache, nasal congestion, post nasal drip,  CV:  No-   chest pain, orthopnea, PND, swelling in lower extremities, anasarca, dizziness, palpitations Resp: No-   shortness of breath with exertion or at rest.              +  productive cough,  + non-productive cough,  No- coughing up of blood.              No-   change in color of mucus.  +recent wheezing.   Skin: No-   rash or lesions. GI:  No-   heartburn, indigestion, abdominal pain, nausea, vomiting,  GU:  MS:  No-   joint pain or swelling.  Neuro-     nothing unusual Psych:  No- change in mood or affect. No depression or anxiety.  No memory loss.  OBJ- Physical Exam General- Alert, Oriented, Affect-appropriate, Distress- none acute, overweight Skin- rash-none, lesions- none, excoriation- none Lymphadenopathy- none Head- atraumatic            Eyes- Gross vision intact, PERRLA, conjunctivae and secretions clear            Ears- Hearing, canals-normal            Nose- Clear, no-Septal  dev, mucus, polyps, erosion, perforation             Throat- Mallampati II-III , mucosa clear , drainage- none, tonsils- atrophic Neck- flexible , trachea midline, no stridor , thyroid nl, carotid no bruit Chest - symmetrical excursion , unlabored           Heart/CV- RRR , no murmur , no gallop  , no rub, nl s1 s2                           - JVD- none , edema- none, stasis changes- none, varices- none           Lung- clear to P&A, wheeze- none, + deep breath->wheezey cough , dullness-none, rub- none           Chest wall-  Abd-  Br/ Gen/  Rectal- Not done, not indicated Extrem- cyanosis- none, clubbing, none, atrophy- none, strength- nl Neuro- grossly intact to observation

## 2011-10-19 NOTE — Patient Instructions (Signed)
Skin Tests do confirm strong sensitization to molds/ fungi.   I will be happy to see you again if you and Dr Marchelle Gearing feel I can help.

## 2011-10-22 DIAGNOSIS — J302 Other seasonal allergic rhinitis: Secondary | ICD-10-CM | POA: Insufficient documentation

## 2011-10-22 NOTE — Assessment & Plan Note (Signed)
Skin test profiles with support a background seasonal allergic rhinitis as well as significant specific sensitization to molds including aspergillus. Total Ig E. of 57.6 is now in a range where Xolair therapy could be considered. This did not seem to work for her in the past, possibly because IgE levels were too high. Standard allergy vaccine could also be offered.

## 2011-10-22 NOTE — Assessment & Plan Note (Signed)
Significantly abnormal allergy profile 09/22/2011 and allergy skin test 10/19/2011. Potential roles for antihistamines, nasal steroids, allergy vaccine.

## 2011-10-22 NOTE — Assessment & Plan Note (Signed)
Management for an IgE component appropriate. Previous attempt with Xolair therapy had been unsuccessful, possibly because IgE levels were too high.

## 2011-10-24 ENCOUNTER — Telehealth: Payer: Self-pay | Admitting: Family Medicine

## 2011-10-24 MED ORDER — VENLAFAXINE HCL 37.5 MG PO TABS: ORAL_TABLET | ORAL | Status: DC

## 2011-10-24 NOTE — Telephone Encounter (Signed)
Refill: effexor XR 37.5 mg cap. Take 3 capsules by mouth daily. 90 day supply

## 2011-10-25 ENCOUNTER — Telehealth: Payer: Self-pay | Admitting: Pharmacist

## 2011-10-25 ENCOUNTER — Other Ambulatory Visit: Payer: Self-pay | Admitting: Pharmacist

## 2011-10-25 DIAGNOSIS — E785 Hyperlipidemia, unspecified: Secondary | ICD-10-CM

## 2011-10-25 MED ORDER — ROSUVASTATIN CALCIUM 20 MG PO TABS: 20.0000 mg | ORAL_TABLET | ORAL | Status: DC

## 2011-10-25 NOTE — Telephone Encounter (Signed)
Spoke with patient to schedule follow-up in June with the lipid clinic. Patient reports that she has been taking Crestor 20mg  MWF (instead of 10mg  MWF) due to having trouble cutting the tablets. She denies any muscle pains or issues with the higher dose. She will continue this dose until she sees [name] in the office. Follow-up visit scheduled for December 14, 2011.

## 2011-10-28 ENCOUNTER — Encounter: Payer: Self-pay | Admitting: Internal Medicine

## 2011-11-07 ENCOUNTER — Telehealth: Payer: Self-pay | Admitting: Internal Medicine

## 2011-11-07 MED ORDER — PREDNISONE 10 MG PO TABS: ORAL_TABLET | ORAL | Status: DC

## 2011-11-07 MED ORDER — DOXYCYCLINE HYCLATE 100 MG PO TABS: 100.0000 mg | ORAL_TABLET | Freq: Two times a day (BID) | ORAL | Status: AC

## 2011-11-07 NOTE — Telephone Encounter (Signed)
Called, spoke with pt.  I informed her of below recs per MR.  I advised to call back or seek emergency care if needed if symptoms do not improve or worsen.  She verbalized understanding of these instructions and aware rxs sent to [facility].

## 2011-11-07 NOTE — Telephone Encounter (Signed)
Take doxycycline 100mg  po twice daily x 5 days; take after meals and avoid sunlight  Please take Take prednisone 40mg  once daily x 3 days, then 30mg  once daily x 3 days, then 20mg  once daily x 3 days, then prednisone 10mg  once daily  x 3 days and then continue baseline maintenance dose

## 2011-11-07 NOTE — Telephone Encounter (Signed)
Spoke with the pt and she states she is having increased SOB, wheezing, chest congestion, dry cough, and chest tightness that has all been getting worse x 2 weeks. She states she has been taking breathing treatments twice a day.  Pt is requesting rx fro pred taper and abx be called in. Pt aware MR in the office this afternoon.  Please advise. Carron Curie, CMA Allergies  Allergen Reactions  . Beclomethasone Dipropionate     REACTION: hives and weight gain  . Budesonide-Formoterol Fumarate     REACTION: gave patient hives  . Mometasone Furo-Formoterol Fum     REACTION: weight gain and hives  . Sulfonamide Derivatives     REACTION: Rash

## 2011-11-19 DIAGNOSIS — J189 Pneumonia, unspecified organism: Secondary | ICD-10-CM

## 2011-11-19 DIAGNOSIS — IMO0001 Reserved for inherently not codable concepts without codable children: Secondary | ICD-10-CM

## 2011-11-19 HISTORY — DX: Reserved for inherently not codable concepts without codable children: IMO0001

## 2011-11-19 HISTORY — DX: Pneumonia, unspecified organism: J18.9

## 2011-12-07 ENCOUNTER — Other Ambulatory Visit: Payer: Medicare Other

## 2011-12-13 ENCOUNTER — Encounter (HOSPITAL_BASED_OUTPATIENT_CLINIC_OR_DEPARTMENT_OTHER): Payer: Self-pay | Admitting: *Deleted

## 2011-12-13 ENCOUNTER — Telehealth: Payer: Self-pay | Admitting: Family Medicine

## 2011-12-13 ENCOUNTER — Emergency Department (HOSPITAL_BASED_OUTPATIENT_CLINIC_OR_DEPARTMENT_OTHER): Payer: Managed Care, Other (non HMO)

## 2011-12-13 ENCOUNTER — Inpatient Hospital Stay (HOSPITAL_BASED_OUTPATIENT_CLINIC_OR_DEPARTMENT_OTHER)
Admission: EM | Admit: 2011-12-13 | Discharge: 2011-12-15 | DRG: 195 | Discharge status: Against Medical Advice | Payer: Managed Care, Other (non HMO) | Attending: Internal Medicine | Admitting: Internal Medicine

## 2011-12-13 DIAGNOSIS — Z7982 Long term (current) use of aspirin: Secondary | ICD-10-CM

## 2011-12-13 DIAGNOSIS — E785 Hyperlipidemia, unspecified: Secondary | ICD-10-CM | POA: Diagnosis not present

## 2011-12-13 DIAGNOSIS — J189 Pneumonia, unspecified organism: Secondary | ICD-10-CM

## 2011-12-13 DIAGNOSIS — K219 Gastro-esophageal reflux disease without esophagitis: Secondary | ICD-10-CM | POA: Diagnosis present

## 2011-12-13 DIAGNOSIS — R0789 Other chest pain: Secondary | ICD-10-CM | POA: Diagnosis not present

## 2011-12-13 DIAGNOSIS — M1990 Unspecified osteoarthritis, unspecified site: Secondary | ICD-10-CM | POA: Diagnosis present

## 2011-12-13 DIAGNOSIS — Z79899 Other long term (current) drug therapy: Secondary | ICD-10-CM

## 2011-12-13 DIAGNOSIS — J4489 Other specified chronic obstructive pulmonary disease: Secondary | ICD-10-CM | POA: Diagnosis present

## 2011-12-13 DIAGNOSIS — R079 Chest pain, unspecified: Secondary | ICD-10-CM

## 2011-12-13 DIAGNOSIS — G4733 Obstructive sleep apnea (adult) (pediatric): Secondary | ICD-10-CM | POA: Diagnosis present

## 2011-12-13 DIAGNOSIS — E876 Hypokalemia: Secondary | ICD-10-CM | POA: Diagnosis present

## 2011-12-13 DIAGNOSIS — F3289 Other specified depressive episodes: Secondary | ICD-10-CM | POA: Diagnosis present

## 2011-12-13 DIAGNOSIS — K589 Irritable bowel syndrome without diarrhea: Secondary | ICD-10-CM | POA: Diagnosis present

## 2011-12-13 DIAGNOSIS — M810 Age-related osteoporosis without current pathological fracture: Secondary | ICD-10-CM | POA: Diagnosis present

## 2011-12-13 DIAGNOSIS — K449 Diaphragmatic hernia without obstruction or gangrene: Secondary | ICD-10-CM | POA: Diagnosis not present

## 2011-12-13 DIAGNOSIS — F329 Major depressive disorder, single episode, unspecified: Secondary | ICD-10-CM | POA: Diagnosis present

## 2011-12-13 DIAGNOSIS — E669 Obesity, unspecified: Secondary | ICD-10-CM | POA: Diagnosis present

## 2011-12-13 DIAGNOSIS — IMO0002 Reserved for concepts with insufficient information to code with codable children: Secondary | ICD-10-CM

## 2011-12-13 DIAGNOSIS — J449 Chronic obstructive pulmonary disease, unspecified: Secondary | ICD-10-CM | POA: Diagnosis present

## 2011-12-13 DIAGNOSIS — J479 Bronchiectasis, uncomplicated: Secondary | ICD-10-CM | POA: Diagnosis not present

## 2011-12-13 HISTORY — DX: Gastro-esophageal reflux disease without esophagitis: K21.9

## 2011-12-13 HISTORY — DX: Personal history of other diseases of the digestive system: Z87.19

## 2011-12-13 HISTORY — DX: Pneumonia, unspecified organism: J18.9

## 2011-12-13 LAB — DIFFERENTIAL
Basophils Absolute: 0.1 10*3/uL (ref 0.0–0.1)
Basophils Relative: 1 % (ref 0–1)
Eosinophils Absolute: 0.7 10*3/uL (ref 0.0–0.7)
Eosinophils Relative: 10 % — ABNORMAL HIGH (ref 0–5)
Monocytes Absolute: 0.6 10*3/uL (ref 0.1–1.0)

## 2011-12-13 LAB — CBC
HCT: 39.2 % (ref 36.0–46.0)
MCH: 30 pg (ref 26.0–34.0)
MCHC: 33.7 g/dL (ref 30.0–36.0)
MCV: 89.1 fL (ref 78.0–100.0)
RDW: 12.8 % (ref 11.5–15.5)

## 2011-12-13 LAB — D-DIMER, QUANTITATIVE: D-Dimer, Quant: 1.54 ug/mL-FEU — ABNORMAL HIGH (ref 0.00–0.48)

## 2011-12-13 LAB — COMPREHENSIVE METABOLIC PANEL
AST: 21 U/L (ref 0–37)
Albumin: 4 g/dL (ref 3.5–5.2)
Calcium: 9.5 mg/dL (ref 8.4–10.5)
Creatinine, Ser: 0.7 mg/dL (ref 0.50–1.10)

## 2011-12-13 LAB — CARDIAC PANEL(CRET KIN+CKTOT+MB+TROPI)
CK, MB: 2.3 ng/mL (ref 0.3–4.0)
Total CK: 45 U/L (ref 7–177)
Troponin I: <0.3 ng/mL (ref <0.30)

## 2011-12-13 MED ORDER — IOHEXOL 350 MG/ML SOLN
80.0000 mL | Freq: Once | INTRAVENOUS | Status: AC | PRN
  Administered 2011-12-13: 80 mL via INTRAVENOUS

## 2011-12-13 MED ORDER — SODIUM CHLORIDE 0.9 % IV SOLN: INTRAVENOUS | Status: AC

## 2011-12-13 MED ORDER — DEXTROSE 5 % IV SOLN
500.0000 mg | Freq: Once | INTRAVENOUS | Status: AC
  Administered 2011-12-13: 500 mg via INTRAVENOUS
  Filled 2011-12-13: qty 500

## 2011-12-13 MED ORDER — DEXTROSE 5 % IV SOLN
1.0000 g | Freq: Once | INTRAVENOUS | Status: AC
  Administered 2011-12-13: 1 g via INTRAVENOUS
  Filled 2011-12-13: qty 10

## 2011-12-13 MED ORDER — GI COCKTAIL ~~LOC~~
30.0000 mL | Freq: Once | ORAL | Status: AC
  Administered 2011-12-13: 30 mL via ORAL
  Filled 2011-12-13: qty 30

## 2011-12-13 NOTE — ED Notes (Signed)
On th way back to treatment room pt states she did have a knot in her leg after taking a long trip that moved after she started taking ASA.

## 2011-12-13 NOTE — ED Notes (Signed)
Report called to [phone] and [facility].

## 2011-12-13 NOTE — ED Notes (Signed)
Chest pain woke her at 11pm. She was diaphoretic, nauseated and weak. Pain went away after 5 minutes. She had a second episode this afternoon after watering flowers. She called her MD and was told to come here.

## 2011-12-13 NOTE — H&P (Signed)
Victoria [name] is an 56 y.o. female.   Chief Complaint: cp HPI: 56 yo female with hx of asthma c/o sharp sscp starting at 11:30 last nite.  + diaphoresis, + nausea.  +sob,  Denies fever, chills, cough, palp, emesis, diarrhea, brbpr, black stool.  The past lasted about and disappeared without intervention.  This afternoon about 2:30pm had further cp, and called pcp=> nurse told her to go to [facility].  CTA chest=>negative for PE.  Initial set of cardiac markers negative.  Ekg : nsr at 70, nl axis, poor R progression.  Pt will be admitted for w/u of cp   Past Medical History  Diagnosis Date  . Asthma   . Obesity   . Maxillary sinusitis   . Depression   . Osteoarthritis   . IBS (irritable bowel syndrome)   . Hyperlipidemia   . COPD (chronic obstructive pulmonary disease)   . Pulmonary nodules   . OSA (obstructive sleep apnea)     Mod OSA by hx in 2007.  AHI 22.5 02/2606  . Osteoporosis   . Nasal congestion 9/12    current cold  . Cough   . Wheezing   . Shortness of breath   . Pneumonia   . GERD (gastroesophageal reflux disease)   . H/O hiatal hernia   . Headache     Past Surgical History  Procedure Date  . Appendectomy   . Cesarean section     Family History  Problem Relation Age of Onset  . Breast cancer Mother   . Cancer Mother 45    breast that metasticized to liver  . Prostate cancer Father   . Diverticulosis Father   . Cancer Father 63    prostate   . Pulmonary embolism Brother    Social History:  reports that she has never smoked. She has never used smokeless tobacco. She reports that she drinks about 2.4 ounces of alcohol per week. She reports that she does not use illicit drugs.  Allergies:  Allergies  Allergen Reactions  . Beclomethasone Dipropionate     REACTION: hives and weight gain  . Budesonide-Formoterol Fumarate     REACTION: gave patient hives  . Mometasone Furo-Formoterol Fum     REACTION: weight gain and hives  . Sulfonamide  Derivatives     REACTION: Rash    Medications Prior to Admission  Medication Sig Dispense Refill  . albuterol (PROAIR HFA) 108 (90 BASE) MCG/ACT inhaler Inhale 2 puffs into the lungs every 6 (six) hours as needed.        Marland Kitchen albuterol (PROVENTIL) (2.5 MG/3ML) 0.083% nebulizer solution Take 3 mLs (2.5 mg total) by nebulization every 6 (six) hours as needed.  75 mL  12  . aspirin 81 MG tablet Take 81 mg by mouth daily.       . Calcium-Magnesium-Vitamin D (CALCIUM MAGNESIUM PO) Take by mouth daily. shaklee osteomatrix- contains calsium, magnesium, vitamin d, vitamin k, zinc, copper and manganese       . Cholecalciferol (VITAMIN D) 2000 UNITS CAPS Take 2,000 Units by mouth.        . Coenzyme Q10 (COQ10 PO) Take 1 tablet by mouth daily.       . montelukast (SINGULAIR) 10 MG tablet Take 1 tablet (10 mg total) by mouth 2 (two) times daily.  180 tablet  4  . Omega-3 Fatty Acids (OMEGA 3 PO) Take 1 capsule by mouth daily.       Maxwell Caul Bicarbonate (ZEGERID) 20-1100  MG CAPS Take 1 capsule by mouth every morning.  30 each  5  . PATADAY 0.2 % SOLN       . predniSONE (DELTASONE) 10 MG tablet Take 5 mg by mouth daily.       . rosuvastatin (CRESTOR) 20 MG tablet Take 1 tablet (20 mg total) by mouth every Monday, Wednesday, and Friday.  14 tablet  6  . tiotropium (SPIRIVA) 18 MCG inhalation capsule Place 18 mcg into inhaler and inhale daily.      Marland Kitchen venlafaxine (EFFEXOR) 37.5 MG tablet Take 3 tablets by mouth daily  270 tablet  0    Results for orders placed during the hospital encounter of 12/13/11 (from the past 48 hour(s))  CBC     Status: Normal   Collection Time   12/13/11  5:30 PM      Component Value Range Comment   WBC 7.3  4.0 - 10.5 K/uL    RBC 4.40  3.87 - 5.11 MIL/uL    Hemoglobin 13.2  12.0 - 15.0 g/dL    HCT 45.4  09.8 - 11.9 %    MCV 89.1  78.0 - 100.0 fL    MCH 30.0  26.0 - 34.0 pg    MCHC 33.7  30.0 - 36.0 g/dL    RDW 14.7  82.9 - 56.2 %    Platelets 302  150 - 400 K/uL     DIFFERENTIAL     Status: Abnormal   Collection Time   12/13/11  5:30 PM      Component Value Range Comment   Neutrophils Relative 52  43 - 77 %    Neutro Abs 3.8  1.7 - 7.7 K/uL    Lymphocytes Relative 28  12 - 46 %    Lymphs Abs 2.1  0.7 - 4.0 K/uL    Monocytes Relative 8  3 - 12 %    Monocytes Absolute 0.6  0.1 - 1.0 K/uL    Eosinophils Relative 10 (*) 0 - 5 %    Eosinophils Absolute 0.7  0.0 - 0.7 K/uL    Basophils Relative 1  0 - 1 %    Basophils Absolute 0.1  0.0 - 0.1 K/uL   COMPREHENSIVE METABOLIC PANEL     Status: Abnormal   Collection Time   12/13/11  5:30 PM      Component Value Range Comment   Sodium 139  135 - 145 mEq/L    Potassium 3.3 (*) 3.5 - 5.1 mEq/L    Chloride 101  96 - 112 mEq/L    CO2 27  19 - 32 mEq/L    Glucose, Bld 98  70 - 99 mg/dL    BUN 11  6 - 23 mg/dL    Creatinine, Ser 1.30  0.50 - 1.10 mg/dL    Calcium 9.5  8.4 - 86.5 mg/dL    Total Protein 7.2  6.0 - 8.3 g/dL    Albumin 4.0  3.5 - 5.2 g/dL    AST 21  0 - 37 U/L    ALT 22  0 - 35 U/L    Alkaline Phosphatase 102  39 - 117 U/L    Total Bilirubin 0.2 (*) 0.3 - 1.2 mg/dL    GFR calc non Af Amer >90  >90 mL/min    GFR calc Af Amer >90  >90 mL/min   CARDIAC PANEL(CRET KIN+CKTOT+MB+TROPI)     Status: Normal   Collection Time   12/13/11  5:30 PM  Component Value Range Comment   Total CK 45  7 - 177 U/L    CK, MB 2.3  0.3 - 4.0 ng/mL    Troponin I <0.30  <0.30 ng/mL    Relative Index RELATIVE INDEX IS INVALID  0.0 - 2.5   D-DIMER, QUANTITATIVE     Status: Abnormal   Collection Time   12/13/11  5:30 PM      Component Value Range Comment   D-Dimer, Quant 1.54 (*) 0.00 - 0.48 ug/mL-FEU    Dg Chest 2 View  12/13/2011  *RADIOLOGY REPORT*  Clinical Data: Mid chest pain.  CHEST - 2 VIEW  Comparison: Chest x-ray 09/14/2011.  Findings: Lung volumes are normal.  No consolidative airspace disease.  No pleural effusions.  Marked bilateral apical nodular pleural parenchymal thickening is similar to  multiple prior examinations, and it is corresponds with extensive pleural parenchymal scarring noted on chest CT 11/22/2007.  Patchy areas of mild multifocal interstitial prominence are similar to prior examinations, also likely represent areas of parenchymal scarring. No evidence of pulmonary edema.  Heart size is normal.  Mediastinal contours are unremarkable.  Focal contour abnormality of the posterior aspect of the left hemidiaphragm is unchanged, consistent with Bochdalek hernia (demonstrated on prior CT scan 11/22/2007).  IMPRESSION: 1.  No radiographic evidence of acute cardiopulmonary disease. 2.  The appearance of the thorax is essentially unchanged compared to prior examinations, as detailed above.  Original Report Authenticated By: Florencia Reasons, M.D.   Ct Angio Chest W/cm &/or Wo Cm  12/13/2011  *RADIOLOGY REPORT*  Clinical Data: Chest pain.  Diaphoresis.  Nausea.  Cough and wheezing.  CT ANGIOGRAPHY CHEST  Technique:  Multidetector CT imaging of the chest using the standard protocol during bolus administration of intravenous contrast. Multiplanar reconstructed images including MIPs were obtained and reviewed to evaluate the vascular anatomy.  Contrast: 80mL OMNIPAQUE IOHEXOL 350 MG/ML SOLN  Comparison: Chest x-ray dated 12/13/2011 and chest CT dated 03/28 1007  Findings: There are no pulmonary emboli.  The patient has fairly extensive bilateral bronchiectasis and there appear be acute inflammatory changes in the left upper lobe with ill-defined nodularity extending to the periphery.  There is narrowing of the bronchus associated with this area of inflammation.  There are chronic inflammatory changes in the right upper lobe which are essentially stable. There are scattered small nodular densities in the right lower lobe which are unchanged as well.  No hilar or mediastinal adenopathy.  No effusions.  Large hiatal hernia with focal eventration of the posterior medial aspect of the left  hemidiaphragm with perineal fat extending into the posterior aspect of the left hemithorax, increased in size since the prior exam.  No acute osseous abnormality.  IMPRESSION:  1.  Acute inflammatory disease in the left upper lobe superimposed on extensive bilateral bronchiectasis. Moderate to maintain follow- up CT scan without contrast in 2 months after treatment to ensure resolution. 2.  Chronic inflammatory changes in the right lung including small nodules that are stable. 3.  Large hiatal hernia and left posterior medial diaphragmatic hernia.  Original Report Authenticated By: Gwynn Burly, M.D.    Review of Systems  Constitutional: Negative for fever, chills, weight loss, malaise/fatigue and diaphoresis.  HENT: Negative for hearing loss, ear pain, nosebleeds, congestion, neck pain, tinnitus and ear discharge.   Eyes: Negative for blurred vision, double vision, photophobia, pain, discharge and redness.  Respiratory: Positive for shortness of breath. Negative for cough, hemoptysis, sputum production and wheezing.  Cardiovascular: Positive for chest pain. Negative for palpitations, orthopnea, claudication, leg swelling and PND.  Gastrointestinal: Positive for nausea. Negative for heartburn, vomiting, abdominal pain, diarrhea, constipation, blood in stool and melena.  Genitourinary: Negative for dysuria, urgency, frequency, hematuria and flank pain.  Musculoskeletal: Negative for myalgias, back pain, joint pain and falls.  Skin: Negative for itching and rash.  Neurological: Negative for dizziness, tingling, tremors, sensory change, speech change, focal weakness, seizures, loss of consciousness, weakness and headaches.  Endo/Heme/Allergies: Negative for environmental allergies and polydipsia. Does not bruise/bleed easily.  Psychiatric/Behavioral: Negative for depression, suicidal ideas, hallucinations and substance abuse. The patient is not nervous/anxious and does not have insomnia.      Blood pressure 115/78, pulse 71, temperature 98 F (36.7 C), temperature source Oral, resp. rate 18, height 5\' 3"  (1.6 m), weight 76.613 kg (168 lb 14.4 oz), SpO2 97.00%. Physical Exam  Constitutional: She is oriented to person, place, and time. She appears well-developed and well-nourished. No distress.  HENT:  Head: Normocephalic and atraumatic.  Nose: Nose normal.  Mouth/Throat: No oropharyngeal exudate.  Eyes: Conjunctivae and EOM are normal. Pupils are equal, round, and reactive to light. Right eye exhibits no discharge. Left eye exhibits no discharge.  Neck: Normal range of motion. Neck supple. No JVD present. No tracheal deviation present. No thyromegaly present.  Cardiovascular: Normal rate and regular rhythm.  Exam reveals no gallop and no friction rub.   No murmur heard. Respiratory: Breath sounds normal. No stridor. No respiratory distress. She has no wheezes. She has no rales. She exhibits no tenderness.  GI: Soft. Bowel sounds are normal. She exhibits no distension and no mass. There is no tenderness. There is no rebound and no guarding.  Musculoskeletal: Normal range of motion. She exhibits no edema and no tenderness.  Lymphadenopathy:    She has no cervical adenopathy.  Neurological: She is alert and oriented to person, place, and time. She has normal reflexes. She displays normal reflexes. No cranial nerve deficit. She exhibits normal muscle tone. Coordination normal.  Skin: Skin is warm and dry. No rash noted. She is not diaphoretic. No erythema. No pallor.  Psychiatric: She has a normal mood and affect. Her behavior is normal. Judgment and thought content normal.     Assessment/Plan Cp Tele cpk mb, trop q6h x3 Npo after mn Stress test in am  Asthma Cont prednisone , cont spiriva, cont singulair  Hypokalemia: replete  Pearson Grippe 12/13/2011, 11:17 PM

## 2011-12-13 NOTE — Telephone Encounter (Signed)
Caller: Victoria [name]/Patient; PCP: Lelon Perla.; CB#: [phone]; ; ; Call regarding Chest Pain/Chest Discomfort;   Last night, 12/12/2011 , pt  woke to chest pain with SOB,   weakness and  clamminess. She states she  almost called 911,  but then CP  stopped after  5 mins.  Today, 12/13/2011 at 1430  she was watering plants , and  had pain in chest again lasting for ~ 5 mins,  but not as bad as last night. RN reached Call 911 DISP for chest pain  x 5 minutes within the last hour per Chest pain protocol and advised to hang up and dial 911 and pt agreed - Pt's sister is with her. RN called and notified "Charae" at office of pt and 911 DISP for chest pain

## 2011-12-13 NOTE — ED Provider Notes (Addendum)
History     CSN: [id number]  Arrival date & time 12/13/11  1652   First MD Initiated Contact with Patient 12/13/11 1707      Chief Complaint  Patient presents with  . Chest Pain    (Consider location/radiation/quality/duration/timing/severity/associated sxs/prior treatment) Patient is a 56 y.o. female presenting with chest pain. The history is provided by the patient.  Chest Pain Episode onset: First episode occurred less than 24 hours ago that awoke her from sleep. The second episode occurred 3 hours ago but was less severe. Duration of episode(s) is 5 minutes. Chest pain occurs intermittently. The chest pain is resolved. Associated with: nothing.  once she was sleeping and the otehr she was watering her flowers. At its most intense, the pain is at 8/10. The pain is currently at 0/10. The severity of the pain is severe. The quality of the pain is described as aching, pressure-like and squeezing. The pain does not radiate. Exacerbated by: nothing. Primary symptoms include shortness of breath, cough and nausea. Pertinent negatives for primary symptoms include no wheezing, no abdominal pain and no vomiting.  Associated symptoms include diaphoresis.  Pertinent negatives for associated symptoms include no near-syncope. She tried aspirin for the symptoms. Risk factors include post-menopausal.  Her past medical history is significant for hyperlipidemia.  Pertinent negatives for past medical history include no hypertension.  Her family medical history is significant for early MI in family.  Procedure history is negative for cardiac catheterization.     Past Medical History  Diagnosis Date  . Asthma   . Obesity   . Maxillary sinusitis   . Depression   . Osteoarthritis   . IBS (irritable bowel syndrome)   . Hyperlipidemia   . COPD (chronic obstructive pulmonary disease)   . Pulmonary nodules   . OSA (obstructive sleep apnea)     Mod OSA by hx in 2007.  AHI 22.5 02/2606  . Osteoporosis     . Nasal congestion 9/12    current cold  . Cough   . Wheezing     Past Surgical History  Procedure Date  . Appendectomy   . Cesarean section     Family History  Problem Relation Age of Onset  . Breast cancer Mother   . Cancer Mother 43    breast that metasticized to liver  . Prostate cancer Father   . Diverticulosis Father   . Cancer Father 21    prostate   . Pulmonary embolism Brother     History  Substance Use Topics  . Smoking status: Never Smoker   . Smokeless tobacco: Never Used  . Alcohol Use: 2.4 oz/week    2 Glasses of wine, 2 Cans of beer per week    OB History    Grav Para Term Preterm Abortions TAB SAB Ect Mult Living                  Review of Systems  Constitutional: Positive for diaphoresis.  Respiratory: Positive for cough and shortness of breath. Negative for wheezing.   Cardiovascular: Positive for chest pain. Negative for near-syncope.  Gastrointestinal: Positive for nausea. Negative for vomiting and abdominal pain.  All other systems reviewed and are negative.    Allergies  Beclomethasone dipropionate; Budesonide-formoterol fumarate; Mometasone furo-formoterol fum; and Sulfonamide derivatives  Home Medications   Current Outpatient Rx  Name Route Sig Dispense Refill  . ALBUTEROL SULFATE HFA 108 (90 BASE) MCG/ACT IN AERS Inhalation Inhale 2 puffs into the lungs every 6 (six)  hours as needed.      . ALBUTEROL SULFATE (2.5 MG/3ML) 0.083% IN NEBU Nebulization Take 3 mLs (2.5 mg total) by nebulization every 6 (six) hours as needed. 75 mL 12  . ASPIRIN 81 MG PO TABS Oral Take 81 mg by mouth daily.     Marland Kitchen CALCIUM MAGNESIUM PO Oral Take by mouth daily. [facility]aklee osteomatrix- contains calsium, magnesium, vitamin d, vitamin k, zinc, copper and manganese     . VITAMIN D 2000 UNITS PO CAPS Oral Take 2,000 Units by mouth.      . COQ10 PO Oral Take 1 tablet by mouth daily.     Marland Kitchen MONTELUKAST SODIUM 10 MG PO TABS Oral Take 1 tablet (10 mg total) by mouth 2  (two) times daily. 180 tablet 4  . OMEGA 3 PO Oral Take 1 capsule by mouth daily.     Marland Kitchen OMEPRAZOLE-SODIUM BICARBONATE 20-1100 MG PO CAPS Oral Take 1 capsule by mouth every morning. 30 each 5  . PATADAY 0.2 % OP SOLN      . PREDNISONE 10 MG PO TABS Oral Take 5 mg by mouth daily.     Marland Kitchen ROSUVASTATIN CALCIUM 20 MG PO TABS Oral Take 1 tablet (20 mg total) by mouth every Monday, Wednesday, and Friday. 14 tablet 6  . TIOTROPIUM BROMIDE MONOHYDRATE 18 MCG IN CAPS Inhalation Place 18 mcg into inhaler and inhale daily.    . VENLAFAXINE HCL 37.5 MG PO TABS  Take 3 tablets by mouth daily 270 tablet 0    BP 123/80  Pulse 80  Temp 98.3 F (36.8 C) (Oral)  Resp 20  SpO2 100%  Physical Exam  Nursing note and vitals reviewed. Constitutional: She is oriented to person, place, and time. She appears well-developed and well-nourished. No distress.  HENT:  Head: Normocephalic and atraumatic.  Mouth/Throat: Oropharynx is clear and moist.  Eyes: Conjunctivae and EOM are normal. Pupils are equal, round, and reactive to light.  Neck: Normal range of motion. Neck supple.  Cardiovascular: Normal rate, regular rhythm and intact distal pulses.   No murmur heard. Pulmonary/Chest: Effort normal and breath sounds normal. No respiratory distress. She has no wheezes. She has no rales. She exhibits no tenderness.  Abdominal: Soft. She exhibits no distension. There is no tenderness. There is no rebound and no guarding.  Musculoskeletal: Normal range of motion. She exhibits no edema and no tenderness.  Neurological: She is alert and oriented to person, place, and time.  Skin: Skin is warm and dry. No rash noted. No erythema.  Psychiatric: She has a normal mood and affect. Her behavior is normal.    ED Course  Procedures (including critical care time)  Labs Reviewed  DIFFERENTIAL - Abnormal; Notable for the following:    Eosinophils Relative 10 (*)     All other components within normal limits  COMPREHENSIVE  METABOLIC PANEL - Abnormal; Notable for the following:    Potassium 3.3 (*)     Total Bilirubin 0.2 (*)     All other components within normal limits  D-DIMER, QUANTITATIVE - Abnormal; Notable for the following:    D-Dimer, Quant 1.54 (*)     All other components within normal limits  CBC  CARDIAC PANEL(CRET KIN+CKTOT+MB+TROPI)   Dg Chest 2 View  12/13/2011  *RADIOLOGY REPORT*  Clinical Data: Mid chest pain.  CHEST - 2 VIEW  Comparison: Chest x-ray 09/14/2011.  Findings: Lung volumes are normal.  No consolidative airspace disease.  No pleural effusions.  Marked bilateral apical nodular  pleural parenchymal thickening is similar to multiple prior examinations, and it is corresponds with extensive pleural parenchymal scarring noted on chest CT 11/22/2007.  Patchy areas of mild multifocal interstitial prominence are similar to prior examinations, also likely represent areas of parenchymal scarring. No evidence of pulmonary edema.  Heart size is normal.  Mediastinal contours are unremarkable.  Focal contour abnormality of the posterior aspect of the left hemidiaphragm is unchanged, consistent with Bochdalek hernia (demonstrated on prior CT scan 11/22/2007).  IMPRESSION: 1.  No radiographic evidence of acute cardiopulmonary disease. 2.  The appearance of the thorax is essentially unchanged compared to prior examinations, as detailed above.  Original Report Authenticated By: Florencia Reasons, M.D.   Ct Angio Chest W/cm &/or Wo Cm  12/13/2011  *RADIOLOGY REPORT*  Clinical Data: Chest pain.  Diaphoresis.  Nausea.  Cough and wheezing.  CT ANGIOGRAPHY CHEST  Technique:  Multidetector CT imaging of the chest using the standard protocol during bolus administration of intravenous contrast. Multiplanar reconstructed images including MIPs were obtained and reviewed to evaluate the vascular anatomy.  Contrast: 80mL OMNIPAQUE IOHEXOL 350 MG/ML SOLN  Comparison: Chest x-ray dated 12/13/2011 and chest CT dated 03/28  1007  Findings: There are no pulmonary emboli.  The patient has fairly extensive bilateral bronchiectasis and there appear be acute inflammatory changes in the left upper lobe with ill-defined nodularity extending to the periphery.  There is narrowing of the bronchus associated with this area of inflammation.  There are chronic inflammatory changes in the right upper lobe which are essentially stable. There are scattered small nodular densities in the right lower lobe which are unchanged as well.  No hilar or mediastinal adenopathy.  No effusions.  Large hiatal hernia with focal eventration of the posterior medial aspect of the left hemidiaphragm with perineal fat extending into the posterior aspect of the left hemithorax, increased in size since the prior exam.  No acute osseous abnormality.  IMPRESSION:  1.  Acute inflammatory disease in the left upper lobe superimposed on extensive bilateral bronchiectasis. Moderate to maintain follow- up CT scan without contrast in 2 months after treatment to ensure resolution. 2.  Chronic inflammatory changes in the right lung including small nodules that are stable. 3.  Large hiatal hernia and left posterior medial diaphragmatic hernia.  Original Report Authenticated By: Gwynn Burly, M.D.     Date: 12/13/2011  Rate: 70  Rhythm: normal sinus rhythm  QRS Axis: normal  Intervals: normal  ST/T Wave abnormalities: normal  Conduction Disutrbances: none  Narrative Interpretation: unremarkable     1. Community acquired pneumonia   2. Chest pain       MDM  Pt with symptoms concerning for ACS.  TIMI 2 for daily ASA use and multiple episodes in 24 hours. Associated symptoms include SOB, diaphoresis, weakness.  Low risk well's but did recently have a long trip. ASA already taken PTA 325mg .  Possibly GERD and gi cocktail given.  Pt states she was told that 2 years ago that she had GERD but never has had definitive testing of the heart. EKG wnl, CXR, CBC, CMP,  CE, d-dimer pending.  Labs normal d-dimer elevated. CT of the chest showed new acute inflammation in the left upper lobe which is most likely a new pneumonia. Patient states she has had a worsening cough and some wheezing the last 2-3 days. However that's not explain her episodes of chest pain. Will admit for IV antibiotics and evaluation of her heart.  Gwyneth Sprout, MD 12/13/11 1911  Gwyneth Sprout, MD 12/13/11 4098

## 2011-12-13 NOTE — Telephone Encounter (Addendum)
Please review.  Patient has been advised to call [phone]   KP

## 2011-12-13 NOTE — ED Notes (Signed)
Pt. Leaving with [facility]. Denies any cp/sob at present.

## 2011-12-14 ENCOUNTER — Inpatient Hospital Stay (HOSPITAL_COMMUNITY): Payer: Managed Care, Other (non HMO)

## 2011-12-14 ENCOUNTER — Ambulatory Visit: Payer: Medicare Other | Admitting: Pharmacist

## 2011-12-14 DIAGNOSIS — J45909 Unspecified asthma, uncomplicated: Secondary | ICD-10-CM | POA: Diagnosis not present

## 2011-12-14 DIAGNOSIS — R079 Chest pain, unspecified: Secondary | ICD-10-CM

## 2011-12-14 DIAGNOSIS — E876 Hypokalemia: Secondary | ICD-10-CM

## 2011-12-14 DIAGNOSIS — J159 Unspecified bacterial pneumonia: Secondary | ICD-10-CM

## 2011-12-14 LAB — CARDIAC PANEL(CRET KIN+CKTOT+MB+TROPI)
CK, MB: 2.2 ng/mL (ref 0.3–4.0)
CK, MB: 2.1 ng/mL (ref 0.3–4.0)
Total CK: 36 U/L (ref 7–177)
Total CK: 40 U/L (ref 7–177)
Troponin I: <0.3 ng/mL (ref <0.30)

## 2011-12-14 MED ORDER — ASPIRIN 81 MG PO CHEW
81.0000 mg | CHEWABLE_TABLET | Freq: Every day | ORAL | Status: DC
  Administered 2011-12-14: 81 mg via ORAL
  Filled 2011-12-14 (×3): qty 1

## 2011-12-14 MED ORDER — REGADENOSON 0.4 MG/5ML IV SOLN
INTRAVENOUS | Status: AC
  Administered 2011-12-14: 0.4 mg via INTRAVENOUS
  Filled 2011-12-14: qty 5

## 2011-12-14 MED ORDER — TECHNETIUM TC 99M TETROFOSMIN IV KIT
10.0000 | PACK | Freq: Once | INTRAVENOUS | Status: AC | PRN
  Administered 2011-12-14: 10 via INTRAVENOUS

## 2011-12-14 MED ORDER — SODIUM CHLORIDE 0.9 % IV SOLN
INTRAVENOUS | Status: DC
  Administered 2011-12-14: 03:00:00 via INTRAVENOUS

## 2011-12-14 MED ORDER — ALBUTEROL SULFATE (5 MG/ML) 0.5% IN NEBU: 2.5000 mg | INHALATION_SOLUTION | Freq: Four times a day (QID) | RESPIRATORY_TRACT | Status: DC | PRN

## 2011-12-14 MED ORDER — PANTOPRAZOLE SODIUM 40 MG PO TBEC
40.0000 mg | DELAYED_RELEASE_TABLET | Freq: Every day | ORAL | Status: DC
  Administered 2011-12-14: 40 mg via ORAL
  Filled 2011-12-14: qty 1

## 2011-12-14 MED ORDER — REGADENOSON 0.4 MG/5ML IV SOLN
0.4000 mg | Freq: Once | INTRAVENOUS | Status: AC
  Administered 2011-12-14: 0.4 mg via INTRAVENOUS
  Filled 2011-12-14: qty 5

## 2011-12-14 MED ORDER — AZITHROMYCIN 250 MG PO TABS
250.0000 mg | ORAL_TABLET | Freq: Every day | ORAL | Status: DC
  Administered 2011-12-14: 250 mg via ORAL
  Filled 2011-12-14 (×2): qty 1

## 2011-12-14 MED ORDER — TECHNETIUM TC 99M TETROFOSMIN IV KIT
30.0000 | PACK | Freq: Once | INTRAVENOUS | Status: AC | PRN
  Administered 2011-12-14: 30 via INTRAVENOUS

## 2011-12-14 MED ORDER — MONTELUKAST SODIUM 10 MG PO TABS
10.0000 mg | ORAL_TABLET | Freq: Two times a day (BID) | ORAL | Status: DC
  Administered 2011-12-14 (×2): 10 mg via ORAL
  Filled 2011-12-14 (×5): qty 1

## 2011-12-14 MED ORDER — PREDNISONE 5 MG PO TABS
5.0000 mg | ORAL_TABLET | Freq: Every day | ORAL | Status: DC
  Administered 2011-12-14: 5 mg via ORAL
  Filled 2011-12-14 (×2): qty 1

## 2011-12-14 MED ORDER — OLOPATADINE HCL 0.1 % OP SOLN
1.0000 [drp] | Freq: Every day | OPHTHALMIC | Status: DC
  Administered 2011-12-14: 1 [drp] via OPHTHALMIC
  Filled 2011-12-14: qty 5

## 2011-12-14 MED ORDER — IBUPROFEN 800 MG PO TABS
800.0000 mg | ORAL_TABLET | Freq: Four times a day (QID) | ORAL | Status: DC | PRN
  Administered 2011-12-14 – 2011-12-15 (×2): 800 mg via ORAL
  Filled 2011-12-14 (×2): qty 1

## 2011-12-14 MED ORDER — ALBUTEROL SULFATE HFA 108 (90 BASE) MCG/ACT IN AERS
2.0000 | INHALATION_SPRAY | Freq: Four times a day (QID) | RESPIRATORY_TRACT | Status: DC | PRN
  Administered 2011-12-14: 2 via RESPIRATORY_TRACT
  Filled 2011-12-14: qty 6.7

## 2011-12-14 MED ORDER — ATORVASTATIN CALCIUM 40 MG PO TABS
40.0000 mg | ORAL_TABLET | Freq: Every day | ORAL | Status: DC
  Administered 2011-12-14: 40 mg via ORAL
  Filled 2011-12-14 (×2): qty 1

## 2011-12-14 MED ORDER — DEXTROSE 5 % IV SOLN
1.0000 g | INTRAVENOUS | Status: DC
  Administered 2011-12-14: 1 g via INTRAVENOUS
  Filled 2011-12-14 (×2): qty 10

## 2011-12-14 MED ORDER — VENLAFAXINE HCL 37.5 MG PO TABS
37.5000 mg | ORAL_TABLET | Freq: Every day | ORAL | Status: DC
  Administered 2011-12-14: 37.5 mg via ORAL
  Filled 2011-12-14 (×2): qty 1

## 2011-12-14 MED ORDER — SODIUM CHLORIDE 0.9 % IJ SOLN: 3.0000 mL | Freq: Two times a day (BID) | INTRAMUSCULAR | Status: DC

## 2011-12-14 MED ORDER — TIOTROPIUM BROMIDE MONOHYDRATE 18 MCG IN CAPS
18.0000 ug | ORAL_CAPSULE | Freq: Every day | RESPIRATORY_TRACT | Status: DC
  Administered 2011-12-14: 18 ug via RESPIRATORY_TRACT
  Filled 2011-12-14 (×2): qty 5

## 2011-12-14 NOTE — Progress Notes (Signed)
Monitored during stress test. Sinus rhythm no ectopics.  Taken to 3736 and report to [employer].

## 2011-12-14 NOTE — Progress Notes (Addendum)
Received report from [employer] 3700. Patient monitored sinus rhythm 70's to 80's with no ectopics. Has some chest tightness currently, states feels it more this am 3-5/10. Spo2 is 97% on room air. Feels better than she did when she was in the room.

## 2011-12-14 NOTE — Progress Notes (Signed)
Dr. Waymon Amato updated of stress test result availability.  Thanks Ancil Linsey RN

## 2011-12-14 NOTE — Progress Notes (Signed)
Subjective:   Chart reviewed. No chest pain today. Patient gives history of lower substernal sharp chest pain that woke her up from sleep at approximately 11 PM 2 nights ago. This was cramping in nature, nonradiating and not associated with dyspnea. Resolved spontaneously after 3-5 minutes. She gives history of similar pain approximately 3 years ago when a treadmill stress test was done at the cardiologist's office and said to be negative. Denies GERD-like symptoms. Does give history of worsening chest congestion, nonproductive cough and worsening dyspnea on exertion over the last couple of weeks. Was supposed to see her primary pulmonologist tomorrow.  Objective  Vital signs in last 24 hours: Filed Vitals:   12/14/11 1043 12/14/11 1045 12/14/11 1047 12/14/11 1400  BP: 133/64 119/67 115/70 126/81  Pulse: 98 93 88 80  Temp:    98.1 F (36.7 C)  TempSrc:    Oral  Resp:    18  Height:      Weight:      SpO2:    100%   Weight change:   Intake/Output Summary (Last 24 hours) at 12/14/11 1815 Last data filed at 12/14/11 1408  Gross per 24 hour  Intake    480 ml  Output      0 ml  Net    480 ml    Physical Exam:  General Exam: Comfortable.  Respiratory System: Clear. No increased work of breathing.  Cardiovascular System: First and second heart sounds heard. Regular rate and rhythm. No JVD/murmurs. Telemetry shows sinus rhythm. Gastrointestinal System: Abdomen is non distended, soft and normal bowel sounds heard. Nontender. Central Nervous System: Alert and oriented. No focal neurological deficits. Extremities: Symmetric 5 x 5 power.  Labs:  Basic Metabolic Panel:  Lab 12/13/11 9604  NA 139  K 3.3*  CL 101  CO2 27  GLUCOSE 98  BUN 11  CREATININE 0.70  CALCIUM 9.5  ALB --  PHOS --   Liver Function Tests:  Lab 12/13/11 1730  AST 21  ALT 22  ALKPHOS 102  BILITOT 0.2*  PROT 7.2  ALBUMIN 4.0   No results found for this basename: LIPASE:3,AMYLASE:3 in the last 168  hours No results found for this basename: AMMONIA:3 in the last 168 hours CBC:  Lab 12/13/11 1730  WBC 7.3  NEUTROABS 3.8  HGB 13.2  HCT 39.2  MCV 89.1  PLT 302   Cardiac Enzymes:  Lab 12/14/11 1250 12/14/11 0545 12/14/11 0012 12/13/11 1730  CKTOTAL 40 36 39 45  CKMB 1.8 2.1 2.2 2.3  CKMBINDEX -- -- -- --  TROPONINI <0.30 <0.30 <0.30 <0.30   CBG: No results found for this basename: GLUCAP:5 in the last 168 hours  Iron Studies: No results found for this basename: IRON,TIBC,TRANSFERRIN,FERRITIN in the last 72 hours Studies/Results: Dg Chest 2 View  12/13/2011  *RADIOLOGY REPORT*  Clinical Data: Mid chest pain.  CHEST - 2 VIEW  Comparison: Chest x-ray 09/14/2011.  Findings: Lung volumes are normal.  No consolidative airspace disease.  No pleural effusions.  Marked bilateral apical nodular pleural parenchymal thickening is similar to multiple prior examinations, and it is corresponds with extensive pleural parenchymal scarring noted on chest CT 11/22/2007.  Patchy areas of mild multifocal interstitial prominence are similar to prior examinations, also likely represent areas of parenchymal scarring. No evidence of pulmonary edema.  Heart size is normal.  Mediastinal contours are unremarkable.  Focal contour abnormality of the posterior aspect of the left hemidiaphragm is unchanged, consistent with Bochdalek hernia (demonstrated on prior  CT scan 11/22/2007).  IMPRESSION: 1.  No radiographic evidence of acute cardiopulmonary disease. 2.  The appearance of the thorax is essentially unchanged compared to prior examinations, as detailed above.  Original Report Authenticated By: Florencia Reasons, M.D.   Ct Angio Chest W/cm &/or Wo Cm  12/13/2011  *RADIOLOGY REPORT*  Clinical Data: Chest pain.  Diaphoresis.  Nausea.  Cough and wheezing.  CT ANGIOGRAPHY CHEST  Technique:  Multidetector CT imaging of the chest using the standard protocol during bolus administration of intravenous contrast.  Multiplanar reconstructed images including MIPs were obtained and reviewed to evaluate the vascular anatomy.  Contrast: 80mL OMNIPAQUE IOHEXOL 350 MG/ML SOLN  Comparison: Chest x-ray dated 12/13/2011 and chest CT dated 03/28 1007  Findings: There are no pulmonary emboli.  The patient has fairly extensive bilateral bronchiectasis and there appear be acute inflammatory changes in the left upper lobe with ill-defined nodularity extending to the periphery.  There is narrowing of the bronchus associated with this area of inflammation.  There are chronic inflammatory changes in the right upper lobe which are essentially stable. There are scattered small nodular densities in the right lower lobe which are unchanged as well.  No hilar or mediastinal adenopathy.  No effusions.  Large hiatal hernia with focal eventration of the posterior medial aspect of the left hemidiaphragm with perineal fat extending into the posterior aspect of the left hemithorax, increased in size since the prior exam.  No acute osseous abnormality.  IMPRESSION:  1.  Acute inflammatory disease in the left upper lobe superimposed on extensive bilateral bronchiectasis. Moderate to maintain follow- up CT scan without contrast in 2 months after treatment to ensure resolution. 2.  Chronic inflammatory changes in the right lung including small nodules that are stable. 3.  Large hiatal hernia and left posterior medial diaphragmatic hernia.  Original Report Authenticated By: Gwynn Burly, M.D.   Nm Myocar Multi W/spect W/wall Motion / Ef  12/14/2011  *RADIOLOGY REPORT*  Clinical Data:  Chest pain.  MYOCARDIAL IMAGING WITH SPECT (REST AND PHARMACOLOGIC-STRESS) GATED LEFT VENTRICULAR WALL MOTION STUDY LEFT VENTRICULAR EJECTION FRACTION  Technique:  Standard myocardial SPECT imaging was performed after resting intravenous injection of 10 mCi Tc-80m tetrofosmin. Subsequently, intravenous infusion of plexus scan was performed under the supervision of the  Cardiology staff.  At peak effect of the drug, 30 mCi Tc-84m tetrofosmin was injected intravenously and standard myocardial SPECT imaging was performed.  Quantitative gated imaging was also performed to evaluate left ventricular wall motion and estimate left ventricular ejection fraction.  Comparison:  Chest CT 12/13/2011.  Findings:  Mild apical septal thinning is present with decreased counts in this region.  This correlates to the prior chest CT. There is no evidence of reversible defect.  Wall motion and thickening is normal.  Ejection fraction is 72%.  End-diastolic volume 70 ml.  End-systolic volume is 19 ml.  IMPRESSION: No evidence of ischemia or infarct.   Calculated ejection fraction 72%.  Original Report Authenticated By: Andreas Newport, M.D.   Medications:    . sodium chloride 50 mL/hr at 12/14/11 0256      . sodium chloride   Intravenous STAT  . aspirin  81 mg Oral Daily  . atorvastatin  40 mg Oral q1800  . azithromycin  500 mg Intravenous Once  . azithromycin  250 mg Oral Daily  . cefTRIAXone (ROCEPHIN)  IV  1 g Intravenous Once  . cefTRIAXone (ROCEPHIN)  IV  1 g Intravenous Q24H  . montelukast  10 mg Oral BID  . olopatadine  1 drop Both Eyes Daily  . pantoprazole  40 mg Oral Q1200  . predniSONE  5 mg Oral Daily  . regadenoson  0.4 mg Intravenous Once  . sodium chloride  3 mL Intravenous Q12H  . tiotropium  18 mcg Inhalation Daily  . venlafaxine  37.5 mg Oral Daily    I  have reviewed scheduled and prn medications.     Problem/Plan: Principal Problem:  *Chest pain  1. Chest pain: Unclear etiology. Differential diagnosis include possible pneumonia and GERD from large hiatal hernia. Ruled out for MI. CTA negative for PE. No evidence of ischemia on nuclear stress test. PPIs and treat for possible pneumonia. 2. Possible pneumonia left upper lobe complicating bronchiectasis: Continue IV Rocephin and oral azithromycin-day 2. Will discuss with patient's primary pulmonologist  on 6/27. 3. Asthma: Stable. No bronchospasm currently. 4. Hypokalemia: Follow BMP tomorrow.  Disposition: Possible discharge on 6/27.  Wilsie Kern 12/14/2011,6:15 PM  LOS: 1 day

## 2011-12-15 ENCOUNTER — Other Ambulatory Visit: Payer: Medicare Other

## 2011-12-15 ENCOUNTER — Ambulatory Visit: Payer: Medicare Other | Admitting: Internal Medicine

## 2011-12-15 ENCOUNTER — Telehealth: Payer: Self-pay | Admitting: Internal Medicine

## 2011-12-15 ENCOUNTER — Ambulatory Visit (INDEPENDENT_AMBULATORY_CARE_PROVIDER_SITE_OTHER): Payer: Medicare Other | Admitting: Internal Medicine

## 2011-12-15 ENCOUNTER — Encounter: Payer: Self-pay | Admitting: Internal Medicine

## 2011-12-15 VITALS — BP 112/80 | HR 88 | Temp 98.1°F | Ht 63.0 in | Wt 171.2 lb

## 2011-12-15 DIAGNOSIS — G4730 Sleep apnea, unspecified: Secondary | ICD-10-CM

## 2011-12-15 DIAGNOSIS — B4481 Allergic bronchopulmonary aspergillosis: Secondary | ICD-10-CM | POA: Diagnosis not present

## 2011-12-15 DIAGNOSIS — J45909 Unspecified asthma, uncomplicated: Secondary | ICD-10-CM | POA: Diagnosis not present

## 2011-12-15 LAB — LIPID PANEL
HDL: 48 mg/dL (ref >39)
LDL Cholesterol: 122 mg/dL — ABNORMAL HIGH (ref 0–99)
Triglycerides: 73 mg/dL (ref <150)
VLDL: 15 mg/dL (ref 0–40)

## 2011-12-15 LAB — BASIC METABOLIC PANEL
CO2: 26 mEq/L (ref 19–32)
Glucose, Bld: 93 mg/dL (ref 70–99)
Potassium: 3.5 mEq/L (ref 3.5–5.1)
Sodium: 143 mEq/L (ref 135–145)

## 2011-12-15 MED ORDER — PREDNISONE 10 MG PO TABS: ORAL_TABLET | ORAL | Status: DC

## 2011-12-15 MED ORDER — LEVOFLOXACIN 500 MG PO TABS: 500.0000 mg | ORAL_TABLET | Freq: Every day | ORAL | Status: AC

## 2011-12-15 NOTE — Discharge Summary (Signed)
Patient left the hospital AGAINST MEDICAL ADVICE.   Discharge Summary  Zuma Hust MR#: [id number]  DOB:12/01/1955  Date of Admission: 12/13/2011 Date of Discharge: 12/15/2011  Patient's PCP: [name], DO  Primary pulmonologist: Dr. Kalman Shan.  Attending Physician:Charlise Giovanetti  Consults: None  Discharge Diagnoses: Principal Problem:  *Chest pain  Possible community acquired pneumonia complicating underlying bronchiectasis/ABPA  Brief Admitting History and Physical 56 year old female patient with history of asthma, ABPA who follows with a pulmonologist, GERD was admitted to the hospital on 12/13/11 for chest pain. The chest pain started 2 nights prior to admission when it woke her up at approximately 11 PM. It was cramping lower substernal pain without radiation. It lasted between 3-5 minutes and subsided spontaneously. She gave history of prior treadmill stress test 3 years ago for similar symptoms which was negative. She also gave history of worsening chest congestion and dyspnea on exertion on going for a couple of weeks.   Hospital Course: Chest pain Present on Admission:  **None**   1. Chest pain: Unclear etiology. Differential diagnosis included possible pneumonia and GERD from large hiatal hernia. Ruled out for MI. CTA negative for PE. No evidence of ischemia on nuclear stress test. PPI's were continued. Treatment was initiated with IV Rocephin and oral azithromycin for presumed pneumonia. No further chest pains in hospital. 2. Possible pneumonia left upper lobe complicating bronchiectasis: Treated with IV Rocephin and oral azithromycin. Patient did not complain of any further chest pains in hospital. 3. Asthma: Stable.  4. Hypokalemia: Repleted  Day of Discharge: Did not get to see the patient, interview or examine her prior to her leaving the hospital AMA  BP 118/77  Pulse 68  Temp 98.3 F (36.8 C) (Oral)  Resp 17  Ht 5\' 3"  (1.6 m)  Wt 78.245 kg (172  lb 8 oz)  BMI 30.56 kg/m2  SpO2 100%  Basic Metabolic Panel:  Lab 12/15/11 5366 12/13/11 1730  NA 143 139  K 3.5 3.3*  CL 108 101  CO2 26 27  GLUCOSE 93 98  BUN 12 11  CREATININE 0.72 0.70  CALCIUM 8.8 9.5  ALB -- --  PHOS -- --   Liver Function Tests:  Lab 12/13/11 1730  AST 21  ALT 22  ALKPHOS 102  BILITOT 0.2*  PROT 7.2  ALBUMIN 4.0   No results found for this basename: LIPASE:3,AMYLASE:3 in the last 168 hours No results found for this basename: AMMONIA:3 in the last 168 hours CBC:  Lab 12/13/11 1730  WBC 7.3  NEUTROABS 3.8  HGB 13.2  HCT 39.2  MCV 89.1  PLT 302   Cardiac Enzymes:  Lab 12/14/11 1250 12/14/11 0545 12/14/11 0012 12/13/11 1730  CKTOTAL 40 36 39 45  CKMB 1.8 2.1 2.2 2.3  CKMBINDEX -- -- -- --  TROPONINI <0.30 <0.30 <0.30 <0.30   CBG: No results found for this basename: GLUCAP:5 in the last 168 hours  Other lab data:  1. Lipid panel: Significant for LDL of 122. Otherwise within normal limits. 2. D-dimer: 1.54.  Studies/Results:   Dg Chest 2 View   12/13/2011 *RADIOLOGY REPORT* Clinical Data: Mid chest pain. CHEST - 2 VIEW Comparison: Chest x-ray 09/14/2011. Findings: Lung volumes are normal. No consolidative airspace disease. No pleural effusions. Marked bilateral apical nodular pleural parenchymal thickening is similar to multiple prior examinations, and it is corresponds with extensive pleural parenchymal scarring noted on chest CT 11/22/2007. Patchy areas of mild multifocal interstitial prominence are similar to prior examinations, also likely represent areas  of parenchymal scarring. No evidence of pulmonary edema. Heart size is normal. Mediastinal contours are unremarkable. Focal contour abnormality of the posterior aspect of the left hemidiaphragm is unchanged, consistent with Bochdalek hernia (demonstrated on prior CT scan 11/22/2007). IMPRESSION: 1. No radiographic evidence of acute cardiopulmonary disease. 2. The appearance of the  thorax is essentially unchanged compared to prior examinations, as detailed above. Original Report Authenticated By: Florencia Reasons, M.D.    Ct Angio Chest W/cm &/or Wo Cm  12/13/2011 *RADIOLOGY REPORT* Clinical Data: Chest pain. Diaphoresis. Nausea. Cough and wheezing. CT ANGIOGRAPHY CHEST Technique: Multidetector CT imaging of the chest using the standard protocol during bolus administration of intravenous contrast. Multiplanar reconstructed images including MIPs were obtained and reviewed to evaluate the vascular anatomy. Contrast: 80mL OMNIPAQUE IOHEXOL 350 MG/ML SOLN Comparison: Chest x-ray dated 12/13/2011 and chest CT dated 03/28 1007 Findings: There are no pulmonary emboli. The patient has fairly extensive bilateral bronchiectasis and there appear be acute inflammatory changes in the left upper lobe with ill-defined nodularity extending to the periphery. There is narrowing of the bronchus associated with this area of inflammation. There are chronic inflammatory changes in the right upper lobe which are essentially stable. There are scattered small nodular densities in the right lower lobe which are unchanged as well. No hilar or mediastinal adenopathy. No effusions. Large hiatal hernia with focal eventration of the posterior medial aspect of the left hemidiaphragm with perineal fat extending into the posterior aspect of the left hemithorax, increased in size since the prior exam. No acute osseous abnormality. IMPRESSION: 1. Acute inflammatory disease in the left upper lobe superimposed on extensive bilateral bronchiectasis. Moderate to maintain follow- up CT scan without contrast in 2 months after treatment to ensure resolution. 2. Chronic inflammatory changes in the right lung including small nodules that are stable. 3. Large hiatal hernia and left posterior medial diaphragmatic hernia. Original Report Authenticated By: Gwynn Burly, M.D.    Nm Myocar Multi W/spect W/wall Motion / Ef    12/14/2011 *RADIOLOGY REPORT* Clinical Data: Chest pain. MYOCARDIAL IMAGING WITH SPECT (REST AND PHARMACOLOGIC-STRESS) GATED LEFT VENTRICULAR WALL MOTION STUDY LEFT VENTRICULAR EJECTION FRACTION Technique: Standard myocardial SPECT imaging was performed after resting intravenous injection of 10 mCi Tc-36m tetrofosmin. Subsequently, intravenous infusion of plexus scan was performed under the supervision of the Cardiology staff. At peak effect of the drug, 30 mCi Tc-47m tetrofosmin was injected intravenously and standard myocardial SPECT imaging was performed. Quantitative gated imaging was also performed to evaluate left ventricular wall motion and estimate left ventricular ejection fraction. Comparison: Chest CT 12/13/2011. Findings: Mild apical septal thinning is present with decreased counts in this region. This correlates to the prior chest CT. There is no evidence of reversible defect. Wall motion and thickening is normal. Ejection fraction is 72%. End-diastolic volume 70 ml. End-systolic volume is 19 ml. IMPRESSION: No evidence of ischemia or infarct. Calculated ejection fraction 72%. Original Report Authenticated By: Andreas Newport, M.D.     Disposition: Patient left AMA and was apparently stable.      Follow-up Appts: Discharge Orders    Future Appointments: Provider: Department: Dept Phone: Center:   12/21/2011 10:00 AM Mariane Masters, PHARMD [facility] [phone] None   02/07/2012 10:30 AM Coralyn Helling, MD [facility] [phone] None      TESTS THAT NEED FOLLOW-UP   Time spent on discharge, talking to the patient, and coordinating care: 0 mins.  Patient had an appointment to see her primary pulmonologist at 11 AM this  morning. It had been explained to her yesterday that this M.D. would discuss her ongoing hospital care with her pulmonologist and she may have to reschedule the appointment. She was agreeable to this on 6/26. However approximately 935 am today, nursing  advised this M.D. that patient was upset and wanted to leave AMA because she wanted to keep the pulmonologist appointment. Advised nursing that M.D. was rounding on other patients at a different hospital and would be in to see her as soon as possible at which time she would be assessed and if deemed appropriate, would be discharged. This was explained to the patient but patient decided to leave AMA anyway. The risks of deterioration from her medical illness was discussed by nursing with the patient. Patient was not discharged on any new medications.  Called and discussed with Dr. Marchelle Gearing and advised him that patient was leaving AMA and would probably come by to see him later today.    SignedMarcellus Scott, MD 12/15/2011, 2:59 PM

## 2011-12-15 NOTE — Progress Notes (Deleted)
  Subjective:    Patient ID: Victoria [name], female    DOB: Mar 15, 1956, 56 y.o.   MRN: [id number]  HPI Reiko Vinje is a 56 y.o. female severe persistent asthma and ABPA.    OV 07/21/2011 FU severe persistent asthma, abpa (restart itraconazole jan 2013)and xmas 2012 admit for RT pneumonia    - IgE in dec and jan 2013 - both over 1000. - Spirometry: 07/21/2011 - fev1 1.63L/65%, Ratio 65 - and this is a very good number for her - Ct Maxillofacial Ltd Wo Cm" 06/29/2011   Minimal mucosal disease within the left frontal lateral recess and at the ostium of the right sphenoid sinus. 2.  No evidence for acute disease or fluid levels.     OV 09/01/2011 On itraconazole since last visit in Jan 2013. Tolerating fine.  Due to see Dr young 09/22/11 for allergies.  Asthma is well controlled on curent regimen highlighted below  Today visiting me earlier than usual. Says since pneummonia around xmas 2012 having fatigue and tiredness. Then 2 weeks ago developed pleurtic pain in rt parasternal area radiating to back. Saw Dr Laury Axon. Says she desaturated during walk test there. CXR, cbc and bmet normal (noted that last tSH and Vit D was in 2011 and normal). Pain resolved in few days. Now just with lot of fatigue. Says from asthma stand point feels well. Today, Fev1 1.53L/61%, ratio 63 and walking desaturation 185 feet x 3 laps Denies travel, edema, dyspnea, wheeze or hemoptysis or hormone use   #chest pain  - glad is resolved. Do not know what that was. If happens again, call us or come soon  #Fatigue  - we will do order for TSH and Vit D check, do it with your next fasting blood sguar check  - probably related to pneumonia  - encourage you to start working out now that the pneumonia has cleared up on xray  #allergies  - keep up appt with Dr Maple Hudson  #Asthma and ABPA  Continue your medications  #Followup  - 3months or sooner if needed     Review of Systems  Constitutional: Negative for fever and unexpected  weight change.  HENT: Negative for ear pain, nosebleeds, congestion, sore throat, rhinorrhea, sneezing, trouble swallowing, dental problem, postnasal drip and sinus pressure.   Eyes: Negative for redness and itching.  Respiratory: Negative for cough, chest tightness, shortness of breath and wheezing.   Cardiovascular: Negative for palpitations and leg swelling.  Gastrointestinal: Negative for nausea and vomiting.  Genitourinary: Negative for dysuria.  Musculoskeletal: Negative for joint swelling.  Skin: Negative for rash.  Neurological: Negative for headaches.  Hematological: Does not bruise/bleed easily.  Psychiatric/Behavioral: Negative for dysphoric mood. The patient is not nervous/anxious.        Objective:   Physical Exam General - Obese HEENT - no sinus tenderness, no oral exudate, no LAN, TM clear Cardiac - s1s2 regular, no murmur Chest - prolonged exhalation, NO WHEEZE )(was wheezing last time) no rales/dullness Abdomen - soft, nontender Extremities - no e/c/c Skin - no rashes Neurologic - normal strength, CN intact Psychiatric - normal mood, behavior        Assessment & Plan:

## 2011-12-15 NOTE — Progress Notes (Signed)
Pt very upset this am, wanting to be dc'd by 1030 to make an appt with Dr. Marchelle Gearing at 1100. Notified Dr Waymon Amato. Pt leaving AMA, risks made aware. Notified Dr. Waymon Amato on AMA.

## 2011-12-15 NOTE — Telephone Encounter (Signed)
Pt came for appt today at 11am.Myli Pae [place], CMA

## 2011-12-15 NOTE — Patient Instructions (Addendum)
#  ABPA and asthma  You are still wheezing I think you got hospitalized due to infection in bronchiectasi  take levaquin 500mg once daily  X 6 days Please take Take prednisone 40mg once daily x 3 days, then 30mg once daily x 3 days, then 20mg once daily x 3 days, then prednisone 10mg once daily  x 3 days and then take scheduled 5mg home daily dose to contnue I will touch base with Dr Young about starting xolair  #Sleep apnea  - please see Dr Sood or Dr Alva for same  #Followup  - 2-3 months with spirometry at followup  - I wil call you in between with update on our consideration of xolair   

## 2011-12-15 NOTE — Progress Notes (Signed)
Pt left with no distress. Pt refusing wc on d/c.

## 2011-12-15 NOTE — Progress Notes (Signed)
Subjective:    Patient ID: Victoria [name], female    DOB: 1956/04/02, 56 y.o.   MRN: [id number]  HPI  Victoria [name] is a 56 y.o. female severe persistent asthma and ABPA.    OV 07/21/2011 FU severe persistent asthma, abpa (restart itraconazole jan 2013)and xmas 2012 admit for RT pneumonia    - IgE in dec and jan 2013 - both over 1000. - Spirometry: 07/21/2011 - fev1 1.63L/65%, Ratio 65 - and this is a very good number for her - Ct Maxillofacial Ltd Wo Cm" 06/29/2011   Minimal mucosal disease within the left frontal lateral recess and at the ostium of the right sphenoid sinus. 2.  No evidence for acute disease or fluid levels.   - Allergy Profile 09/22/11- Total IgE 575, broad elevations for several molds.  Allergy Skin Tests 09/22/11: - significant positives for some grass and weed pollens, mixed house dust, tobacco and especially for several molds including aspergillus.       OV 09/01/2011 On itraconazole since last visit in Jan 2013. Tolerating fine.  Due to see Dr young 09/22/11 for allergies.  Asthma is well controlled on curent regimen highlighted below  Today visiting me earlier than usual. Says since pneummonia around xmas 2012 having fatigue and tiredness. Then 2 weeks ago developed pleurtic pain in rt parasternal area radiating to back. Saw Dr Laury Axon. Says she desaturated during walk test there. CXR, cbc and bmet normal (noted that last tSH and Vit D was in 2011 and normal). Pain resolved in few days. Now just with lot of fatigue. Says from asthma stand point feels well. Today, Fev1 1.53L/61%, ratio 63 and walking desaturation 185 feet x 3 laps - no desaturation.  Denies travel, edema, dyspnea, wheeze or hemoptysis or hormone use   OV 12/15/2011 Followup for above. In interim has seen Dr Maple Hudson allergies 09/22/11 and 10/31/11 - results above. Looks like skin test positive and IgE now down in 500s (patient says was send out lab because I cannot find it in epic). Dr Maple Hudson thinks she is suitable for  xolair re-challenge and is considering allergy vaccine. Also, she got admitted 2 days ago on 12/13/11 for chest pain. PE ruled out it. Stress test negative per Dr Waymon Amato. CT chest shows likely worsening infectious exacerbation of ABPA. She signed out AMA so she can visit my office today. She received IV antibiotics but did not get steroids beyond baseline dose. Curently feelk at baseline. She is upset about 2 admissions in 6 months; wants sleep referral  Spirometry 12/15/2011  -fev1 1.4L/55%, Ratio 59    Past Medical History  Diagnosis Date  . Asthma   . Obesity   . Maxillary sinusitis   . Depression   . Osteoarthritis   . IBS (irritable bowel syndrome)   . Hyperlipidemia   . COPD (chronic obstructive pulmonary disease)   . Pulmonary nodules   . OSA (obstructive sleep apnea)     Mod OSA by hx in 2007.  AHI 22.5 02/2606  . Osteoporosis   . Nasal congestion 9/12    current cold  . Cough   . Wheezing   . Shortness of breath   . Pneumonia   . GERD (gastroesophageal reflux disease)   . H/O hiatal hernia   . Headache      Family History  Problem Relation Age of Onset  . Breast cancer Mother   . Cancer Mother 62    breast that metasticized to liver  . Prostate cancer Father   .  Diverticulosis Father   . Cancer Father 64    prostate   . Pulmonary embolism Brother      History   Social History  . Marital Status: Married    Spouse Name: N/A    Number of Children: 1  . Years of Education: N/A   Occupational History  .     Social History Main Topics  . Smoking status: Never Smoker   . Smokeless tobacco: Never Used  . Alcohol Use: 2.4 oz/week    2 Glasses of wine, 2 Cans of beer per week  . Drug Use: No  . Sexually Active: Not on file   Other Topics Concern  . Not on file   Social History Narrative  . No narrative on file     Allergies  Allergen Reactions  . Beclomethasone Dipropionate     REACTION: hives and weight gain  . Budesonide-Formoterol Fumarate       REACTION: gave patient hives  . Mometasone Furo-Formoterol Fum     REACTION: weight gain and hives  . Sulfonamide Derivatives     REACTION: Rash     Outpatient Prescriptions Prior to Visit  Medication Sig Dispense Refill  . albuterol (PROAIR HFA) 108 (90 BASE) MCG/ACT inhaler Inhale 2 puffs into the lungs every 6 (six) hours as needed.        Marland Kitchen albuterol (PROVENTIL) (2.5 MG/3ML) 0.083% nebulizer solution Take 3 mLs (2.5 mg total) by nebulization every 6 (six) hours as needed.  75 mL  12  . aspirin 81 MG tablet Take 81 mg by mouth daily.       . Calcium-Magnesium-Vitamin D (CALCIUM MAGNESIUM PO) Take by mouth daily. shaklee osteomatrix- contains calsium, magnesium, vitamin d, vitamin k, zinc, copper and manganese       . Cholecalciferol (VITAMIN D) 2000 UNITS CAPS Take 2,000 Units by mouth.        . Coenzyme Q10 (COQ10 PO) Take 1 tablet by mouth daily.       . montelukast (SINGULAIR) 10 MG tablet Take 1 tablet (10 mg total) by mouth 2 (two) times daily.  180 tablet  4  . Omega-3 Fatty Acids (OMEGA 3 PO) Take 1 capsule by mouth daily.       Maxwell Caul Bicarbonate (ZEGERID) 20-1100 MG CAPS Take 1 capsule by mouth every morning.  30 each  5  . PATADAY 0.2 % SOLN as needed.       . predniSONE (DELTASONE) 10 MG tablet Take 5 mg by mouth daily.       . rosuvastatin (CRESTOR) 20 MG tablet Take 1 tablet (20 mg total) by mouth every Monday, Wednesday, and Friday.  14 tablet  6  . tiotropium (SPIRIVA) 18 MCG inhalation capsule Place 18 mcg into inhaler and inhale daily.      Marland Kitchen venlafaxine (EFFEXOR) 37.5 MG tablet Take 3 tablets by mouth daily  270 tablet  0   Facility-Administered Medications Prior to Visit  Medication Dose Route Frequency Provider Last Rate Last Dose  . technetium tetrofosmin (TC-MYOVIEW) injection 10 milli Curie  10 milli Curie Intravenous Once PRN Medication Radiologist, MD   10 milli Curie at 12/14/11 1200  . technetium tetrofosmin (TC-MYOVIEW) injection 30 milli  Curie  30 milli Curie Intravenous Once PRN Medication Radiologist, MD   30 milli Curie at 12/14/11 1302  . 0.9 %  sodium chloride infusion   Intravenous Continuous Massie Maroon, MD 50 mL/hr at 12/14/11 0256    . albuterol (PROVENTIL HFA;VENTOLIN HFA)  108 (90 BASE) MCG/ACT inhaler 2 puff  2 puff Inhalation Q6H PRN Massie Maroon, MD   2 puff at 12/14/11 1030  . albuterol (PROVENTIL) (5 MG/ML) 0.5% nebulizer solution 2.5 mg  2.5 mg Nebulization Q6H PRN Massie Maroon, MD      . aspirin chewable tablet 81 mg  81 mg Oral Daily Massie Maroon, MD   81 mg at 12/14/11 1212  . atorvastatin (LIPITOR) tablet 40 mg  40 mg Oral q1800 Massie Maroon, MD   40 mg at 12/14/11 1812  . azithromycin (ZITHROMAX) tablet 250 mg  250 mg Oral Daily Massie Maroon, MD   250 mg at 12/14/11 1212  . cefTRIAXone (ROCEPHIN) 1 g in dextrose 5 % 50 mL IVPB  1 g Intravenous Q24H Elease Etienne, MD   1 g at 12/14/11 1813  . ibuprofen (ADVIL,MOTRIN) tablet 800 mg  800 mg Oral Q6H PRN Roma Kayser Schorr, NP   800 mg at 12/15/11 0602  . montelukast (SINGULAIR) tablet 10 mg  10 mg Oral BID Massie Maroon, MD   10 mg at 12/14/11 2141  . olopatadine (PATANOL) 0.1 % ophthalmic solution 1 drop  1 drop Both Eyes Daily Massie Maroon, MD   1 drop at 12/14/11 1214  . pantoprazole (PROTONIX) EC tablet 40 mg  40 mg Oral Q1200 Massie Maroon, MD   40 mg at 12/14/11 1211  . predniSONE (DELTASONE) tablet 5 mg  5 mg Oral Daily Massie Maroon, MD   5 mg at 12/14/11 1212  . sodium chloride 0.9 % injection 3 mL  3 mL Intravenous Q12H Massie Maroon, MD      . tiotropium [facility]) inhalation capsule 18 mcg  18 mcg Inhalation Daily Massie Maroon, MD   18 mcg at 12/14/11 [phone]  . venlafaxine (EFFEXOR) tablet 37.5 mg  37.5 mg Oral Daily Massie Maroon, MD   37.5 mg at 12/14/11 1211     Review of Systems  Constitutional: Negative for fever, chills, diaphoresis, activity change, appetite change, fatigue and unexpected weight change.  HENT: Negative for hearing loss, ear pain,  nosebleeds, congestion, sore throat, facial swelling, rhinorrhea, sneezing, mouth sores, trouble swallowing, neck pain, neck stiffness, dental problem, voice change, postnasal drip, sinus pressure, tinnitus and ear discharge.   Eyes: Negative for photophobia, discharge, itching and visual disturbance.  Respiratory: Positive for cough. Negative for apnea, choking, chest tightness, shortness of breath, wheezing and stridor.   Cardiovascular: Negative for chest pain, palpitations and leg swelling.  Gastrointestinal: Negative for nausea, vomiting, abdominal pain, constipation, blood in stool and abdominal distention.  Genitourinary: Negative for dysuria, urgency, frequency, hematuria, flank pain, decreased urine volume and difficulty urinating.  Musculoskeletal: Negative for myalgias, back pain, joint swelling, arthralgias and gait problem.  Skin: Negative for color change, pallor and rash.  Neurological: Negative for dizziness, tremors, seizures, syncope, speech difficulty, weakness, light-headedness, numbness and headaches.  Hematological: Negative for adenopathy. Does not bruise/bleed easily.  Psychiatric/Behavioral: Negative for confusion, disturbed wake/sleep cycle and agitation. The patient is not nervous/anxious.        Objective:   Physical Exam  Vitals reviewed. Constitutional: She is oriented to person, place, and time. She appears well-developed and well-nourished. No distress.       .Body mass index is 30.33 kg/(m^2).   HENT:  Head: Normocephalic and atraumatic.  Right Ear: External ear normal.  Left Ear: External ear normal.  Mouth/Throat: Oropharynx is clear and moist. No oropharyngeal  exudate.  Eyes: Conjunctivae and EOM are normal. Pupils are equal, round, and reactive to light. Right eye exhibits no discharge. Left eye exhibits no discharge. No scleral icterus.  Neck: Normal range of motion. Neck supple. No JVD present. No tracheal deviation present. No thyromegaly present.    Cardiovascular: Normal rate, regular rhythm, normal heart sounds and intact distal pulses.  Exam reveals no gallop and no friction rub.   No murmur heard. Pulmonary/Chest: Effort normal. No respiratory distress. She has wheezes. She has no rales. She exhibits no tenderness.       Bilateral wheeze +  Abdominal: Soft. Bowel sounds are normal. She exhibits no distension and no mass. There is no tenderness. There is no rebound and no guarding.  Musculoskeletal: Normal range of motion. She exhibits no edema and no tenderness.  Lymphadenopathy:    She has no cervical adenopathy.  Neurological: She is alert and oriented to person, place, and time. She has normal reflexes. No cranial nerve deficit. She exhibits normal muscle tone. Coordination normal.  Skin: Skin is warm and dry. No rash noted. She is not diaphoretic. No erythema. No pallor.  Psychiatric: She has a normal mood and affect. Her behavior is normal. Judgment and thought content normal.          Assessment & Plan:

## 2011-12-16 NOTE — Assessment & Plan Note (Signed)
You are still wheezing I think you got hospitalized due to infection in bronchiectasi  take levaquin 500mg  once daily  X 6 days Please take Take prednisone 40mg  once daily x 3 days, then 30mg  once daily x 3 days, then 20mg  once daily x 3 days, then prednisone 10mg  once daily  x 3 days and then take scheduled 5mg  home daily dose to contnue I will touch base with Dr Maple Hudson about starting xolair    #Followup  - 2-3 months with spirometry at followup  - I wil call you in between with update on our consideration of xolair

## 2011-12-16 NOTE — Assessment & Plan Note (Signed)
She finally as agreed to see sleep doc. Will refer her to [facility] or dr Craige Cotta

## 2011-12-17 ENCOUNTER — Telehealth: Payer: Self-pay | Admitting: Internal Medicine

## 2011-12-17 DIAGNOSIS — J45909 Unspecified asthma, uncomplicated: Secondary | ICD-10-CM

## 2011-12-17 NOTE — Assessment & Plan Note (Signed)
#  ABPA and asthma  You are still wheezing I think you got hospitalized due to infection in bronchiectasi  take levaquin 500mg  once daily  X 6 days Please take Take prednisone 40mg  once daily x 3 days, then 30mg  once daily x 3 days, then 20mg  once daily x 3 days, then prednisone 10mg  once daily  x 3 days and then take scheduled 5mg  home daily dose to contnue I will touch base with Dr Maple Hudson about starting xolair  #Sleep apnea  - please see Dr Craige Cotta or Dr Vassie Loll for same  #Followup  - 2-3 months with spirometry at followup  - I wil call you in between with update on our consideration of xolair

## 2011-12-17 NOTE — Telephone Encounter (Signed)
CLint  Let me know where you got the lower IgE level of 576 . IF not, I willrepeat it and if lower than 700 will call her about xolair Rx  Thanks  MR

## 2011-12-19 NOTE — Telephone Encounter (Signed)
Allergy Profile 09/23/11 reported total IgE 575.6. We had to get results sent again from [facility]- I have sent it down to be scanned. Don't kjnow why it didn't load into lab section originally.

## 2011-12-21 ENCOUNTER — Ambulatory Visit: Payer: Medicare Other | Admitting: Pharmacist

## 2011-12-29 NOTE — Telephone Encounter (Signed)
Crystal please help MR find the pt IGe results tomorrow when he is in office. They are listed under the lab tab with date 09-22-11. They are listed under allergy food profile. Once you click on that lab you then need to click on the scan link and then you can see the results. They are also listed under media tab as well. Thanks. Carron Curie, CMA

## 2011-12-29 NOTE — Telephone Encounter (Signed)
[name],   Please coordinate with K welch if need be. But Dr Maple Hudson says he did IgE with [facility] during allergy testing. It is not in epic but apparently the level was [phone]. I need these results personally (cannot see it in results or media) so I can call patient about Xolair plan  Thanks MR

## 2011-12-29 NOTE — Telephone Encounter (Signed)
Ok please show me tomorrow in office. I still cannot find it

## 2011-12-29 NOTE — Telephone Encounter (Signed)
The results are listed under allergy food profile on date 09-22-11. When you click on that lab, you will need to then click on the scan link and then you can see the results. Carron Curie, CMA

## 2011-12-30 NOTE — Telephone Encounter (Signed)
MR, pls advise on the dosage and frequency of xolair so I can place order.  Thank you.

## 2011-12-30 NOTE — Telephone Encounter (Signed)
IgE 09/22/11 - 575.6 and done at [facility]. She qualifies for Xolair. I gave her result: she is willing to try xolair again.   Please set the process and in august/sept I will see her in office and on that day she can ge the shot

## 2011-12-30 NOTE — Telephone Encounter (Signed)
She should be on 375mg  q 2 weeks for IgE 575 and her weihgt 171#. Please process paper work and give her first avail office slot so she could get the dose on the day I see her

## 2011-12-30 NOTE — Telephone Encounter (Signed)
I showed MR how to find this information.  Will forward msg to him to address.

## 2012-01-02 NOTE — Telephone Encounter (Signed)
Order has been placed.  PCCs have given this to Ros[place]y.  Will sign off.

## 2012-01-05 ENCOUNTER — Ambulatory Visit (INDEPENDENT_AMBULATORY_CARE_PROVIDER_SITE_OTHER): Payer: Managed Care, Other (non HMO) | Admitting: Pharmacist

## 2012-01-05 DIAGNOSIS — E785 Hyperlipidemia, unspecified: Secondary | ICD-10-CM

## 2012-01-05 NOTE — Patient Instructions (Signed)
Great job on the improvement in your cholesterol.   Continue Crestor 20mg  on Monday, Wednesday and Friday.   Recheck labs in 6 months.

## 2012-01-05 NOTE — Progress Notes (Signed)
Subjective -  Victoria [name] presented to clinic today in good spirits for a follow up visit to discuss her lipids.  Her treatment history includes simvastatin and lipitor, both of which caused muscle aches and "restless legs" at night, and Livalo, which was too costly for her (tier 3 copay).  At last visit she was started on Crestor 20mg  on Monday, Wednesday and Friday.  She noticed a few muscle pains but would stop the medication for a few days and the pains resolved.    Diet - She reports trying to maintain a healthy diet.  She has changed from coffee to black tea with honey.  Breakfast is blueberry and banana smoothie with flax seed, lunch is stir fry, salad or fruit/yogurt, & dinner is fish or chicken, brown rice and a vegetable.  She avoids fried foods and fast food meals.  Exercise - She has an extensive history of asthma and immunogenic/allergic reactive airway disease that limits her physical activity.  She continues to exercise in the pool and use paddles for her arms.    Current Outpatient Prescriptions  Medication Sig Dispense Refill  . albuterol (PROAIR HFA) 108 (90 BASE) MCG/ACT inhaler Inhale 2 puffs into the lungs every 6 (six) hours as needed.        Marland Kitchen albuterol (PROVENTIL) (2.5 MG/3ML) 0.083% nebulizer solution Take 3 mLs (2.5 mg total) by nebulization every 6 (six) hours as needed.  75 mL  12  . aspirin 81 MG tablet Take 81 mg by mouth daily.       . Calcium-Magnesium-Vitamin D (CALCIUM MAGNESIUM PO) Take by mouth daily. shaklee osteomatrix- contains calsium, magnesium, vitamin d, vitamin k, zinc, copper and manganese       . Cholecalciferol (VITAMIN D) 2000 UNITS CAPS Take 2,000 Units by mouth.        . Coenzyme Q10 (COQ10 PO) Take 1 tablet by mouth daily.       Marland Kitchen LOTEMAX 0.5 % GEL as needed.      . mometasone (ASMANEX) 220 MCG/INH inhaler Inhale 2 puffs into the lungs daily.      . montelukast (SINGULAIR) 10 MG tablet Take 1 tablet (10 mg total) by mouth 2 (two) times daily.  180  tablet  4  . Omega-3 Fatty Acids (OMEGA 3 PO) Take 1 capsule by mouth daily.       Maxwell Caul Bicarbonate (ZEGERID) 20-1100 MG CAPS Take 1 capsule by mouth every morning.  30 each  5  . PATADAY 0.2 % SOLN as needed.       . predniSONE (DELTASONE) 10 MG tablet Take 5 mg by mouth daily.       . predniSONE (DELTASONE) 10 MG tablet Take 4 tabs daily x 3 days, then 3 tabs daily x 3 days, then 2 tabs daily x 3 days, then 1 tab daily x 3 days, then 1/5 tab daily and continue.  30 tablet  0  . rosuvastatin (CRESTOR) 20 MG tablet Take 1 tablet (20 mg total) by mouth every Monday, Wednesday, and Friday.  14 tablet  6  . tiotropium (SPIRIVA) 18 MCG inhalation capsule Place 18 mcg into inhaler and inhale daily.      Marland Kitchen venlafaxine (EFFEXOR) 37.5 MG tablet Take 3 tablets by mouth daily  270 tablet  0

## 2012-01-09 NOTE — Assessment & Plan Note (Signed)
LDL greatly improved with 3 day a week Crestor.  TC- 185 (goal<200), TG- 73 (goal<150), HDL- 48 (goal>45), LDL- 122 (goal<130).  LFTs are WNL.  Will continue current therapy at this time.  Recheck labs in 6 months.  If LDL remains at goal, will have pt follow up with PCP for further management.

## 2012-01-18 ENCOUNTER — Telehealth: Payer: Self-pay | Admitting: Family Medicine

## 2012-01-18 ENCOUNTER — Other Ambulatory Visit: Payer: Self-pay | Admitting: Internal Medicine

## 2012-01-18 DIAGNOSIS — E785 Hyperlipidemia, unspecified: Secondary | ICD-10-CM

## 2012-01-18 MED ORDER — TIOTROPIUM BROMIDE MONOHYDRATE 18 MCG IN CAPS: 18.0000 ug | ORAL_CAPSULE | Freq: Every day | RESPIRATORY_TRACT | Status: DC

## 2012-01-18 MED ORDER — ROSUVASTATIN CALCIUM 20 MG PO TABS: 20.0000 mg | ORAL_TABLET | ORAL | Status: DC

## 2012-01-18 NOTE — Telephone Encounter (Signed)
Refill: Crestor tab 20mg .

## 2012-01-20 ENCOUNTER — Ambulatory Visit: Payer: Medicare Other | Admitting: Family Medicine

## 2012-01-24 ENCOUNTER — Ambulatory Visit (INDEPENDENT_AMBULATORY_CARE_PROVIDER_SITE_OTHER): Payer: Managed Care, Other (non HMO) | Admitting: Family Medicine

## 2012-01-24 ENCOUNTER — Encounter: Payer: Self-pay | Admitting: Family Medicine

## 2012-01-24 VITALS — BP 120/70 | HR 90 | Temp 98.1°F | Wt 173.0 lb

## 2012-01-24 DIAGNOSIS — R05 Cough: Secondary | ICD-10-CM

## 2012-01-24 DIAGNOSIS — J40 Bronchitis, not specified as acute or chronic: Secondary | ICD-10-CM

## 2012-01-24 DIAGNOSIS — J45909 Unspecified asthma, uncomplicated: Secondary | ICD-10-CM

## 2012-01-24 MED ORDER — ALBUTEROL SULFATE (5 MG/ML) 0.5% IN NEBU
2.5000 mg | INHALATION_SOLUTION | Freq: Once | RESPIRATORY_TRACT | Status: AC
  Administered 2012-01-24: 2.5 mg via RESPIRATORY_TRACT

## 2012-01-24 MED ORDER — HYDROCOD POLST-CHLORPHEN POLST 10-8 MG/5ML PO LQCR: 5.0000 mL | Freq: Every evening | ORAL | Status: DC | PRN

## 2012-01-24 MED ORDER — ALBUTEROL SULFATE (2.5 MG/3ML) 0.083% IN NEBU: 2.5000 mg | INHALATION_SOLUTION | Freq: Four times a day (QID) | RESPIRATORY_TRACT | Status: DC | PRN

## 2012-01-24 MED ORDER — IPRATROPIUM BROMIDE 0.02 % IN SOLN
0.5000 mg | Freq: Once | RESPIRATORY_TRACT | Status: AC
  Administered 2012-01-24: 0.5 mg via RESPIRATORY_TRACT

## 2012-01-24 MED ORDER — METHYLPREDNISOLONE ACETATE 80 MG/ML IJ SUSP
80.0000 mg | Freq: Once | INTRAMUSCULAR | Status: AC
  Administered 2012-01-24: 80 mg via INTRAMUSCULAR

## 2012-01-24 MED ORDER — AZITHROMYCIN 250 MG PO TABS: ORAL_TABLET | ORAL | Status: AC

## 2012-01-24 NOTE — Progress Notes (Signed)
  Subjective:     Victoria [name] is a 56 y.o. female here for evaluation of a cough. Onset of symptoms was 8 days ago. Symptoms have been gradually worsening since that time. The cough is productive and is aggravated by dust, fumes, infection, pollens and reclining position. Associated symptoms include: fever, shortness of breath, sputum production and wheezing. Patient does have a history of asthma. Patient does have a history of environmental allergens. Patient has not traveled recently. Patient does not have a history of smoking. Patient has not had a previous chest x-ray. Patient has not had a PPD done.  The following portions of the patient's history were reviewed and updated as appropriate: allergies, current medications, past family history, past medical history, past social history, past surgical history and problem list.  Review of Systems Pertinent items are noted in HPI.    Objective:    Oxygen saturation 99% on room air BP 120/70  Pulse 90  Temp 98.1 F (36.7 C) (Oral)  Wt 173 lb (78.472 kg)  SpO2 99% General appearance: alert, cooperative, appears stated age and no distress Ears: normal TM's and external ear canals both ears Nose: Nares normal. Septum midline. Mucosa normal. No drainage or sinus tenderness. Throat: lips, mucosa, and tongue normal; teeth and gums normal Neck: no adenopathy, no carotid bruit, no JVD, supple, symmetrical, trachea midline and thyroid not enlarged, symmetric, no tenderness/mass/nodules Lungs: diminished breath sounds bilaterally Heart: S1, S2 normal    Assessment:    Acute Bronchitis    Plan:    Antibiotics per medication orders. Antitussives per medication orders. Avoid exposure to tobacco smoke and fumes. B-agonist inhaler. Call if shortness of breath worsens, blood in sputum, change in character of cough, development of fever or chills, inability to maintain nutrition and hydration. Avoid exposure to tobacco smoke and fumes. Steroid  inhaler as ordered. steroid taper, depo medrol

## 2012-01-24 NOTE — Patient Instructions (Addendum)

## 2012-02-07 ENCOUNTER — Ambulatory Visit (INDEPENDENT_AMBULATORY_CARE_PROVIDER_SITE_OTHER): Payer: Managed Care, Other (non HMO) | Admitting: Pulmonary Disease

## 2012-02-07 ENCOUNTER — Encounter: Payer: Self-pay | Admitting: Pulmonary Disease

## 2012-02-07 VITALS — BP 118/70 | HR 85 | Temp 98.0°F | Ht 63.0 in | Wt 172.8 lb

## 2012-02-07 DIAGNOSIS — G4730 Sleep apnea, unspecified: Secondary | ICD-10-CM

## 2012-02-07 DIAGNOSIS — G4733 Obstructive sleep apnea (adult) (pediatric): Secondary | ICD-10-CM

## 2012-02-07 NOTE — Progress Notes (Signed)
  Subjective:    Patient ID: Victoria [name], female    DOB: 07/06/1955, 56 y.o.   MRN: [id number]  HPI    Review of Systems  Constitutional: Negative for fever, appetite change and unexpected weight change.  HENT: Negative for ear pain, congestion, sore throat, sneezing, trouble swallowing and dental problem.   Respiratory: Positive for cough.   Cardiovascular: Negative for chest pain, palpitations and leg swelling.  Gastrointestinal: Negative for abdominal pain.  Musculoskeletal: Negative for joint swelling.  Skin: Negative for rash.  Neurological: Negative for headaches.  Psychiatric/Behavioral: Negative for dysphoric mood. The patient is not nervous/anxious.        Objective:   Physical Exam        Assessment & Plan:

## 2012-02-07 NOTE — Assessment & Plan Note (Signed)
She has history of severe OSA from 2007. She, unfortunately, was under the impression her OSA was borderline, and therefore was never started on therapy.  She has continued snoring, sleep disruption, daytime sleepiness, and witnessed apnea.  I am concerned she likely still has sleep apnea.  I have reviewed her sleep test results from 2007.  Explained how sleep apnea can affect the patient's health.  Driving precautions and importance of weight loss were discussed.  Treatment options for sleep apnea were reviewed.  She will need to have more recent sleep study to confirm continued presence of sleep apnea prior to arranging for any therapy.  Will arrange for in lab sleep study.

## 2012-02-07 NOTE — Progress Notes (Signed)
Chief Complaint  Patient presents with  . Advice Only    refer MR. snores at night--had sleep study 2006-2007 borderline sleep apnea    History of Present Illness: Deeann Servidio is a 56 y.o. female for evaluation of sleep apnea.  She has sleep study 03/15/06.  She was found to have severe OSA with AHI 32.6, and SpO2 low of 85%.    She apparently was told she had borderline sleep apnea, and that it was her decision to start CPAP.  She was to have set up with CPAP, but the DME her son uses didn't accept her insurance.  She was never contacted by another DME, and therefore did not pursue therapy.  She had an episode several weeks ago in which she woke up from sleep feeling like her chest hurt, and she was very sweaty.  She is followed by Dr. Marchelle Gearing for severe asthma and ABPA.  She was last seen in June 2013, and this episode was discussed.  She finally was agreeable to have further evaluation of sleep apnea.  She snores, and will stop breathing while asleep.  She has tried a mouth guard to help with this, but this hasn't helped.  She goes to bed at 10 pm, and usually falls asleep quickly.  She wakes up several times during the night to tend to her son who has cerebral palsy.  She quickly falls back to sleep, and is usually only awake for 5 minutes.  She gets out of bed at 8 am.  She feels tired throughout the day.  She does not use anything to help her sleep or stay awake.  Her Epworth score is 3 out of 24.  The patient denies sleep walking, sleep talking, bruxism, or nightmares.  There is no history of restless legs.  The patient denies sleep hallucinations, sleep paralysis, or cataplexy.   Past Medical History  Diagnosis Date  . Asthma   . Obesity   . Maxillary sinusitis   . Depression   . Osteoarthritis   . IBS (irritable bowel syndrome)   . Hyperlipidemia   . COPD (chronic obstructive pulmonary disease)   . Pulmonary nodules   . OSA (obstructive sleep apnea)     Mod OSA by hx  in 2007.  AHI 22.5 02/2606  . Osteoporosis   . Nasal congestion 9/12    current cold  . Cough   . Wheezing   . Shortness of breath   . Pneumonia   . GERD (gastroesophageal reflux disease)   . H/O hiatal hernia   . Headache     Past Surgical History  Procedure Date  . Appendectomy   . Cesarean section     Current Outpatient Prescriptions on File Prior to Visit  Medication Sig Dispense Refill  . albuterol (PROAIR HFA) 108 (90 BASE) MCG/ACT inhaler Inhale 2 puffs into the lungs every 6 (six) hours as needed.        Marland Kitchen albuterol (PROVENTIL) (2.5 MG/3ML) 0.083% nebulizer solution Take 3 mLs (2.5 mg total) by nebulization every 6 (six) hours as needed for wheezing.  75 mL  12  . aspirin 81 MG tablet Take 81 mg by mouth daily.       . Calcium-Magnesium-Vitamin D (CALCIUM MAGNESIUM PO) Take by mouth daily. shaklee osteomatrix- contains calsium, magnesium, vitamin d, vitamin k, zinc, copper and manganese       . chlorpheniramine-HYDROcodone (TUSSIONEX PENNKINETIC ER) 10-8 MG/5ML LQCR Take 5 mLs by mouth at bedtime as needed.  140 mL  0  . Cholecalciferol (VITAMIN D) 2000 UNITS CAPS Take 2,000 Units by mouth.        . Coenzyme Q10 (COQ10 PO) Take 1 tablet by mouth daily.       Marland Kitchen itraconazole (SPORANOX) 100 MG capsule Take 100 mg by mouth 2 (two) times daily.      Marland Kitchen LOTEMAX 0.5 % GEL as needed.      . mometasone (ASMANEX) 220 MCG/INH inhaler Inhale 2 puffs into the lungs daily.      . montelukast (SINGULAIR) 10 MG tablet Take 1 tablet (10 mg total) by mouth 2 (two) times daily.  180 tablet  4  . Omega-3 Fatty Acids (OMEGA 3 PO) Take 1 capsule by mouth daily.       Maxwell Caul Bicarbonate (ZEGERID) 20-1100 MG CAPS Take 1 capsule by mouth every morning.  30 each  5  . PATADAY 0.2 % SOLN as needed.       . predniSONE (DELTASONE) 10 MG tablet Take 5 mg by mouth daily.       . rosuvastatin (CRESTOR) 20 MG tablet Take 1 tablet (20 mg total) by mouth every Monday, Wednesday, and Friday.  42  tablet  1  . tiotropium (SPIRIVA) 18 MCG inhalation capsule Place 1 capsule (18 mcg total) into inhaler and inhale daily.  90 capsule  3  . venlafaxine (EFFEXOR) 37.5 MG tablet Take 3 tablets by mouth daily  270 tablet  0  . DISCONTD: albuterol (PROVENTIL) (2.5 MG/3ML) 0.083% nebulizer solution Take 3 mLs (2.5 mg total) by nebulization every 6 (six) hours as needed.  75 mL  12    Allergies  Allergen Reactions  . Beclomethasone Dipropionate     REACTION: hives and weight gain  . Budesonide-Formoterol Fumarate     REACTION: gave patient hives  . Mometasone Furo-Formoterol Fum     REACTION: weight gain and hives  . Sulfonamide Derivatives     REACTION: Rash    Family History  Problem Relation Age of Onset  . Breast cancer Mother   . Prostate cancer Father   . Diverticulosis Father   . Pulmonary embolism Brother     History  Substance Use Topics  . Smoking status: Never Smoker   . Smokeless tobacco: Never Used  . Alcohol Use: 2.4 oz/week    2 Glasses of wine, 2 Cans of beer per week     Physical Exam: Filed Vitals:   02/07/12 1030  BP: 118/70  Pulse: 85  Temp: 98 F (36.7 C)  TempSrc: Oral  Height: 5\' 3"  (1.6 m)  Weight: 172 lb 12.8 oz (78.382 kg)  SpO2: 100%  ,  Current Encounter SPO2  02/07/12 1030 100%  01/24/12 1048 99%  12/15/11 1118 100%    Wt Readings from Last 3 Encounters:  02/07/12 172 lb 12.8 oz (78.382 kg)  01/24/12 173 lb (78.472 kg)  12/15/11 171 lb 3.2 oz (77.656 kg)    Body mass index is 30.61 kg/(m^2).   General - No distress, moon facies ENT - TM clear, no sinus tenderness, no oral exudate, no LAN, no thyromegaly, MP 3 Cardiac - s1s2 regular, no murmur, pulses symmetric, no edema Chest - decreased breath sounds, coarse breath sounds b/l, no wheeze Back - no focal tenderness Abd - soft, non-tender, no organomegaly, + bowel sounds Ext - normal motor strength Neuro - Cranial nerves are normal. PERLA. EOM's intact. Skin - no discernible  active dermatitis, erythema, urticaria or inflammatory process. Psych - normal mood, and  behavior.   Lab Results  Component Value Date   WBC 7.3 12/13/2011   HGB 13.2 12/13/2011   HCT 39.2 12/13/2011   MCV 89.1 12/13/2011   PLT 302 12/13/2011    Lab Results  Component Value Date   CREATININE 0.72 12/15/2011   BUN 12 12/15/2011   NA 143 12/15/2011   K 3.5 12/15/2011   CL 108 12/15/2011   CO2 26 12/15/2011    Lab Results  Component Value Date   ALT 22 12/13/2011   AST 21 12/13/2011   ALKPHOS 102 12/13/2011   BILITOT 0.2* 12/13/2011    Lab Results  Component Value Date   TSH 1.65 09/07/2011    Assessment/Plan:  Coralyn Helling, MD [facility] Pager:  [phone] 02/07/2012, 10:32 AM

## 2012-02-07 NOTE — Patient Instructions (Signed)
Will schedule sleep study Will call to schedule follow up after sleep study reviewed 

## 2012-02-16 ENCOUNTER — Telehealth: Payer: Self-pay | Admitting: Internal Medicine

## 2012-02-16 NOTE — Telephone Encounter (Signed)
LMTCB for the pt 

## 2012-02-17 NOTE — Telephone Encounter (Signed)
Called, spoke with pt.  States she is trying to get to a 9:30 appt right now.  She is requesting to call us back this evening to discuss this.  Will await call back.

## 2012-02-19 DIAGNOSIS — G4733 Obstructive sleep apnea (adult) (pediatric): Secondary | ICD-10-CM

## 2012-02-19 HISTORY — DX: Obstructive sleep apnea (adult) (pediatric): G47.33

## 2012-02-20 ENCOUNTER — Ambulatory Visit (HOSPITAL_BASED_OUTPATIENT_CLINIC_OR_DEPARTMENT_OTHER): Payer: Managed Care, Other (non HMO) | Attending: Pulmonary Disease | Admitting: Radiology

## 2012-02-20 VITALS — Ht 63.0 in | Wt 170.0 lb

## 2012-02-20 DIAGNOSIS — G4733 Obstructive sleep apnea (adult) (pediatric): Secondary | ICD-10-CM

## 2012-02-21 ENCOUNTER — Encounter (INDEPENDENT_AMBULATORY_CARE_PROVIDER_SITE_OTHER): Payer: Self-pay | Admitting: General Surgery

## 2012-02-21 ENCOUNTER — Ambulatory Visit (INDEPENDENT_AMBULATORY_CARE_PROVIDER_SITE_OTHER): Payer: Managed Care, Other (non HMO) | Admitting: General Surgery

## 2012-02-21 VITALS — BP 118/66 | HR 64 | Temp 98.6°F | Resp 16 | Ht 63.0 in | Wt 170.6 lb

## 2012-02-21 DIAGNOSIS — K439 Ventral hernia without obstruction or gangrene: Secondary | ICD-10-CM

## 2012-02-21 NOTE — Progress Notes (Addendum)
Patient ID: Victoria [name], female   DOB: [birth year], 56 y.o.   MRN: [id number]  Chief Complaint  Patient presents with  . Follow-up    reck hernia    HPI Victoria [name] is a 56 y.o. female.  She returns for further evaluation of her abdominal wall hernia.  This patient has a ventral hernia about 5 cm above the umbilicus containing fat only. It is starting to cause some more pain. A CT scan shows  may be a little bit larger but contains fat only.  She also has a hiatal hernia and a Bochdalek hernia. She has reflux symptoms that are completely controlled by omeprazole. She has never had evaluation of this by GI. She is not very symptomatic from that.  CT scan also shows small bilateral inguinal hernias containing fat only. She has no symptoms in her groins.  She has Cushing's syndrome and is on prednisone. She was hospitalized in January with bilateral pneumonia. She is being treated for aspergillosis. She had a cardiac workup in June by [facility] and that was negative. She has asthma. She has depression.  She states that pulmonary-wise, she feels better than she has a long time. She remains overweight. She is here with her husband today to talk about whether any of the hernias need to be operated on. HPI  Past Medical History  Diagnosis Date  . Asthma   . Obesity   . Maxillary sinusitis   . Depression   . Osteoarthritis   . IBS (irritable bowel syndrome)   . Hyperlipidemia   . COPD (chronic obstructive pulmonary disease)   . Pulmonary nodules   . OSA (obstructive sleep apnea)        . Osteoporosis   . Nasal congestion 9/12    current cold  . Cough   . Wheezing   . Shortness of breath   . Pneumonia   . GERD (gastroesophageal reflux disease)   . H/O hiatal hernia   . Headache     Past Surgical History  Procedure Date  . Appendectomy   . Cesarean section     Family History  Problem Relation Age of Onset  . Breast cancer Mother   . Prostate cancer Father   . Diverticulosis  Father   . Pulmonary embolism Brother     Social History History  Substance Use Topics  . Smoking status: Never Smoker   . Smokeless tobacco: Never Used  . Alcohol Use: 2.4 oz/week    2 Glasses of wine, 2 Cans of beer per week    Allergies  Allergen Reactions  . Beclomethasone Dipropionate     REACTION: hives and weight gain  . Budesonide-Formoterol Fumarate     REACTION: gave patient hives  . Mometasone Furo-Formoterol Fum     REACTION: weight gain and hives  . Sulfonamide Derivatives     REACTION: Rash    Current Outpatient Prescriptions  Medication Sig Dispense Refill  . albuterol (PROAIR HFA) 108 (90 BASE) MCG/ACT inhaler Inhale 2 puffs into the lungs every 6 (six) hours as needed.        Marland Kitchen albuterol (PROVENTIL) (2.5 MG/3ML) 0.083% nebulizer solution Take 3 mLs (2.5 mg total) by nebulization every 6 (six) hours as needed for wheezing.  75 mL  12  . aspirin 81 MG tablet Take 81 mg by mouth daily.       . Calcium-Magnesium-Vitamin D (CALCIUM MAGNESIUM PO) Take by mouth daily. shaklee osteomatrix- contains calsium, magnesium, vitamin d, vitamin k, zinc, copper and  manganese       . chlorpheniramine-HYDROcodone (TUSSIONEX PENNKINETIC ER) 10-8 MG/5ML LQCR Take 5 mLs by mouth at bedtime as needed.  140 mL  0  . Cholecalciferol (VITAMIN D) 2000 UNITS CAPS Take 2,000 Units by mouth.        . itraconazole (SPORANOX) 100 MG capsule Take 100 mg by mouth 2 (two) times daily.      . mometasone (ASMANEX) 220 MCG/INH inhaler Inhale 2 puffs into the lungs daily.      . montelukast (SINGULAIR) 10 MG tablet Take 1 tablet (10 mg total) by mouth 2 (two) times daily.  180 tablet  4  . Omega-3 Fatty Acids (OMEGA 3 PO) Take 1 capsule by mouth daily.       Maxwell Caul Bicarbonate (ZEGERID) 20-1100 MG CAPS Take 1 capsule by mouth every morning.  30 each  5  . predniSONE (DELTASONE) 10 MG tablet Take 5 mg by mouth daily.       . rosuvastatin (CRESTOR) 20 MG tablet Take 1 tablet (20 mg total)  by mouth every Monday, Wednesday, and Friday.  42 tablet  1  . tiotropium (SPIRIVA) 18 MCG inhalation capsule Place 1 capsule (18 mcg total) into inhaler and inhale daily.  90 capsule  3  . venlafaxine (EFFEXOR) 37.5 MG tablet Take 3 tablets by mouth daily  270 tablet  0    Review of Systems Review of Systems  Constitutional: Negative for fever, chills and unexpected weight change.  HENT: Negative for hearing loss, congestion, sore throat, trouble swallowing and voice change.   Eyes: Negative for visual disturbance.  Respiratory: Negative for cough and wheezing.   Cardiovascular: Negative for chest pain, palpitations and leg swelling.  Gastrointestinal: Positive for abdominal pain. Negative for nausea, vomiting, diarrhea, constipation, blood in stool, abdominal distention and anal bleeding.  Genitourinary: Negative for hematuria, vaginal bleeding and difficulty urinating.  Musculoskeletal: Negative for arthralgias.  Skin: Negative for rash and wound.  Neurological: Negative for seizures, syncope and headaches.  Hematological: Negative for adenopathy. Does not bruise/bleed easily.  Psychiatric/Behavioral: Negative for confusion.    Blood pressure 118/66, pulse 64, temperature 98.6 F (37 C), temperature source Temporal, resp. rate 16, height 5\' 3"  (1.6 m), weight 170 lb 9.6 oz (77.384 kg).  Physical Exam Physical Exam  Constitutional: She is oriented to person, place, and time. She appears well-developed and well-nourished. No distress.       Central obesity.  HENT:  Head: Normocephalic and atraumatic.  Nose: Nose normal.  Mouth/Throat: No oropharyngeal exudate.  Eyes: Conjunctivae and EOM are normal. Pupils are equal, round, and reactive to light. Left eye exhibits no discharge. No scleral icterus.  Neck: Neck supple. No JVD present. No tracheal deviation present. No thyromegaly present.  Cardiovascular: Normal rate, regular rhythm, normal heart sounds and intact distal pulses.     No murmur heard. Pulmonary/Chest: Effort normal and breath sounds normal. No respiratory distress. She has no wheezes. She has no rales. She exhibits no tenderness.  Abdominal: Soft. Bowel sounds are normal. She exhibits no distension and no mass. There is no tenderness. There is no rebound and no guarding.       She has a mostly reducible ventral hernia in the midline about 5 cm above the umbilicus. There are  no scars on her abdomen.  Genitourinary:       I cannot detect an inguinal hernia on either side.  Musculoskeletal: She exhibits no edema and no tenderness.  Lymphadenopathy:    She  has no cervical adenopathy.  Neurological: She is alert and oriented to person, place, and time. She exhibits normal muscle tone. Coordination normal.  Skin: Skin is warm. No rash noted. She is not diaphoretic. No erythema. No pallor.  Psychiatric: She has a normal mood and affect. Her behavior is normal. Judgment and thought content normal.    Data Reviewed Lots of hospital records. CT scans. Mild records.  Assessment    Symptomatic ventral hernia, supraumbilical, containing fat. This is her main complaint.  No clinical evidence of inguinal hernia.  GERD, well controlled on proton pump inhibitors. I do not think that she needs hiatal hernia repair  Cushing syndrome, on chronic low-dose prednisone  Asthma  Aspergillosis of lungs,  Hospitalization for bilateral pneumonia, Junior 2013.    Plan    She was advised to consider elective repair of her ventral hernia with mesh. I told her  that because of her obesity and immunosuppression that laparoscopic approach would be my preference although it might have to be converted to open.  I advised against any surgery for hiatal hernia or inguinal hernia.  I discussed the natural history of her ventral hernia. She's going to go home and think about this and call me back when she is ready to have surgery.  When she is ready to have this repaired, we  will ask Dr. Marchelle Gearing of [facility] to get medical clearance. ADDENDUM(03/09/2012):  Dr. Marchelle Gearing sent note, advises acceptable risk stratification; advises stress dose steroids pre/post op and DVT prophylaxis, usual nebulizers and pulmonary toilet.       Angelia Mould. Derrell Lolling, M.D., [facility] General and Minimally invasive Surgery Breast and Colorectal Surgery Office:   [phone] Pager:   [phone]  02/21/2012, 5:16 PM

## 2012-02-21 NOTE — Telephone Encounter (Signed)
Spoke with pt and notified of recs per MR. She will keep her 02-27-12 appt to discuss with MR more. Nothing further needed.

## 2012-02-21 NOTE — Telephone Encounter (Signed)
Called, spoke with pt.  States she was on xolair approx in 2005.  She was amazed at how much Geoffry Paradise has increased since then.  She was approved for [employer] - will cover her copay but still has concerns of $7000 being billed to her husband's insurance each mo for 2 injections.  States she "feels super" right now.  Reports she was recently on an omega 3 that was all dha based (900 mg of dha) -- this was alge based.  States she does have mold sensitivity --- questions how her IgE would look now that she has stopped the alge based omega 3.  Also, would like to know if there are any other options before xolair like allergy shots.  States she would be willing to try xolair for a year but if not feeling any different after this time, would stop it d/t the amount being billed to insurance.  She does have a pending OV with MR on Sept 9 at 4:30.  Requesting MR's thoughts on above.  MR, pls advise.  Thank you.

## 2012-02-21 NOTE — Telephone Encounter (Signed)
Lot of info to discuss. Prefer I do face to face 02/27/12. Is that ok with her ?

## 2012-02-21 NOTE — Patient Instructions (Signed)
The pain that you're having is due to the ventral hernia in your anterior abdominal wall. Please call Dr. Derrell Lolling when you are ready to schedule the laparoscopic repair of this hernia with mesh.  I do not detect inguinal hernias on exam and you are not having any pain in your groin. Dr. Derrell Lolling does not recommend surgery for inguinal hernia.  Your hiatal hernia and Bochdalek hernia seen on CT scan are probably not causing much problem. If you want the hiatal hernia further evaluated, I recommended you see a gastroenterologist and possibly perform upper endoscopy.

## 2012-02-23 ENCOUNTER — Telehealth: Payer: Self-pay | Admitting: Pulmonary Disease

## 2012-02-23 DIAGNOSIS — G4733 Obstructive sleep apnea (adult) (pediatric): Secondary | ICD-10-CM

## 2012-02-23 NOTE — Telephone Encounter (Signed)
I spoke with pt and she is scheduled ot come in 02/28/12 at 1:30.

## 2012-02-23 NOTE — Telephone Encounter (Signed)
PSG 02/20/12>>AHI 5.3, SpO2 low 91%, PLMI 42.3.  Minimal supine sleep (had positional effect).  Will have my nurse call to schedule ROV to review sleep study.

## 2012-02-24 NOTE — Procedures (Signed)
Victoria [name], Victoria [name]                 ACCOUNT NO.:  [id number]  MEDICAL RECORD NO.:  [id number]          PATIENT TYPE:  OUT  LOCATION:  SLEEP CENTER                 FACILITY:  [facility]  PHYSICIAN:  Coralyn Helling, MD        DATE OF BIRTH:  07-17-1955  DATE OF STUDY:  02/20/2012                           NOCTURNAL POLYSOMNOGRAM  REFERRING PHYSICIAN:  Coralyn Helling, MD  FACILITY:  [facility].  INDICATION FOR STUDY:  Victoria [name] is a 56 year old female who has a history of severe COPD and depression.  She also had a previous sleep study from 2007, which showed severe obstructive sleep apnea with an apnea-hypopnea index of 35.6, and oxygen saturation nadir of 85%.  She was never on therapy for her sleep apnea.  She continues to have sleep disruption, and daytime sleepiness.  She is therefore referred to the sleep lab for further evaluation of hypersomnia with obstructive sleep apnea.  Height is 5 feet 2 inches, weight is 170 pounds, BMI is 30, neck size is 16 inches.  EPWORTH SLEEPINESS SCORE:  5.  MEDICATIONS:  Albuterol, aspirin, calcium, Tussionex, vitamin D, CoQ10, Asmanex, Singulair, Zegerid, prednisone, Crestor, Spiriva, and Effexor.  SLEEP ARCHITECTURE:  Total recording time was 436 minutes, total sleep time was 388 minutes, sleep efficiency was 89%, sleep latency was 18 minutes.  REM latency was 384 minutes.  The patient was observed in all stages of sleep and she slept predominantly in the nonsupine position.  RESPIRATORY DATA:  The average respiratory rate was 14.  Moderate snoring was noted by the technician.  The overall apnea-hypopnea index was 5.3.  The respiratory disturbance index was 12.7.  There were 12 central apneic events and 1 mixed respiratory event.  The remainder of the events were obstructive in nature.  The supine apnea-hypopnea index was 13.  CARDIAC DATA:  The average heart rate was 67, the rhythm strip showed sinus rhythm with occasional  PVCs.  OXYGEN DATA:  The baseline oxygenation was 98%.  The oxygen saturation nadir was 91%.  The study was conducted without the use of supplemental oxygen.  MOVEMENT-PARASOMNIA:  The periodic limb movement index was 42.3.  The patient had no restroom trips.  IMPRESSIONS-RECOMMENDATIONS:  This study shows evidence for mild obstructive sleep apnea with an apnea-hypopnea index of 5.3 and oxygen saturation nadir of 91%.  She did have a significant positional effect to her sleep-disordered breathing.  She had an increase in her periodic limb movement index and clinical correlation will be necessary to determine the significance of this.  In addition to diet, exercise, and weight reduction, additional therapeutic interventions could include CPAP therapy, oral appliance, or surgical intervention.     Coralyn Helling, MD Diplomat, American Board of Sleep Medicine    VS/MEDQ  D:  02/23/2012 12:06:20  T:  02/24/2012 03:52:53  Job:  [id number]

## 2012-02-27 ENCOUNTER — Encounter: Payer: Self-pay | Admitting: Internal Medicine

## 2012-02-27 ENCOUNTER — Ambulatory Visit (INDEPENDENT_AMBULATORY_CARE_PROVIDER_SITE_OTHER): Payer: Managed Care, Other (non HMO) | Admitting: Internal Medicine

## 2012-02-27 VITALS — BP 112/74 | HR 84 | Temp 98.6°F | Ht 63.0 in | Wt 170.4 lb

## 2012-02-27 DIAGNOSIS — J45909 Unspecified asthma, uncomplicated: Secondary | ICD-10-CM

## 2012-02-27 MED ORDER — PREDNISONE 1 MG PO TABS: ORAL_TABLET | ORAL | Status: DC

## 2012-02-27 NOTE — Progress Notes (Signed)
  Subjective:    Patient ID: Victoria [name], female    DOB: Feb 14, 1956, 56 y.o.   MRN: [id number]  HPI   Here to discuss A) Xolair - too expensive for the insurer. She will get benefit on copay through foundation but is worried that rising health costs might mean the [employer] could fire her husband. I validated her feelings on this and offered therapeutic listening. Also, concerned about rare side effects of xolari which we discussed. AT end of this she has agreed to try it for 1 year   B) Ma Rings out of prednisone abruptly 1-2 weeks ago and wondering if she should stay quit because she is aymptomatic and asthma is not worse. Nor is she having any steroid withdrawal issues   Review of Systems  Constitutional: Negative for fever, chills, diaphoresis, activity change, appetite change, fatigue and unexpected weight change.  HENT: Negative for hearing loss, ear pain, nosebleeds, congestion, sore throat, facial swelling, rhinorrhea, sneezing, mouth sores, trouble swallowing, neck pain, neck stiffness, dental problem, voice change, postnasal drip, sinus pressure, tinnitus and ear discharge.   Eyes: Negative for photophobia, discharge, itching and visual disturbance.  Respiratory: Negative for apnea, cough, choking, chest tightness, shortness of breath, wheezing and stridor.   Cardiovascular: Negative for chest pain, palpitations and leg swelling.  Gastrointestinal: Negative for nausea, vomiting, abdominal pain, constipation, blood in stool and abdominal distention.  Genitourinary: Negative for dysuria, urgency, frequency, hematuria, flank pain, decreased urine volume and difficulty urinating.  Musculoskeletal: Negative for myalgias, back pain, joint swelling, arthralgias and gait problem.  Skin: Negative for color change, pallor and rash.  Neurological: Positive for headaches. Negative for dizziness, tremors, seizures, syncope, speech difficulty, weakness, light-headedness and numbness.    Hematological: Negative for adenopathy. Does not bruise/bleed easily.  Psychiatric/Behavioral: Negative for confusion, disturbed wake/sleep cycle and agitation. The patient is not nervous/anxious.        Objective:   Physical Exam  Looks well Lungs clear      Assessment & Plan:

## 2012-02-27 NOTE — Patient Instructions (Addendum)
Based on our discussion you will start xolair It would be dangerous to come off prednisone abruptly, take 2 mg per day atleast and then in 1 month go to 1mg  per day and continue Then in 3 months we will cut it down even further Reutrn in 3 months or sooner if needed Have flu shot asap - per our discussion, next month

## 2012-02-28 ENCOUNTER — Telehealth: Payer: Self-pay | Admitting: Internal Medicine

## 2012-02-28 ENCOUNTER — Ambulatory Visit (INDEPENDENT_AMBULATORY_CARE_PROVIDER_SITE_OTHER): Payer: Managed Care, Other (non HMO) | Admitting: Pulmonary Disease

## 2012-02-28 ENCOUNTER — Encounter: Payer: Self-pay | Admitting: Pulmonary Disease

## 2012-02-28 VITALS — BP 104/64 | HR 81 | Temp 98.6°F | Ht 63.0 in | Wt 171.8 lb

## 2012-02-28 DIAGNOSIS — G4733 Obstructive sleep apnea (adult) (pediatric): Secondary | ICD-10-CM

## 2012-02-28 NOTE — Patient Instructions (Signed)
Will arrange for CPAP set up at home Follow up in 8 weeks 

## 2012-02-28 NOTE — Progress Notes (Signed)
Chief Complaint  Patient presents with  . Follow-up    Pt here to discuss sleep study results.     History of Present Illness: Victoria [name] is a 56 y.o. female with OSA.  PSG 02/20/12>>AHI 5.3, SpO2 low 91%, PLMI 42.3. Minimal supine sleep (had positional effect).    Past Medical History  Diagnosis Date  . Asthma   . Obesity   . Maxillary sinusitis   . Depression   . Osteoarthritis   . IBS (irritable bowel syndrome)   . Hyperlipidemia   . COPD (chronic obstructive pulmonary disease)   . Pulmonary nodules   . OSA (obstructive sleep apnea)        . Osteoporosis   . Nasal congestion 9/12    current cold  . Cough   . Wheezing   . Shortness of breath   . Pneumonia   . GERD (gastroesophageal reflux disease)   . H/O hiatal hernia   . Headache     Past Surgical History  Procedure Date  . Appendectomy   . Cesarean section     Outpatient Encounter Prescriptions as of 02/28/2012  Medication Sig Dispense Refill  . albuterol (PROAIR HFA) 108 (90 BASE) MCG/ACT inhaler Inhale 2 puffs into the lungs every 6 (six) hours as needed.        Marland Kitchen albuterol (PROVENTIL) (2.5 MG/3ML) 0.083% nebulizer solution Take 3 mLs (2.5 mg total) by nebulization every 6 (six) hours as needed for wheezing.  75 mL  12  . aspirin 81 MG tablet Take 81 mg by mouth daily.       . Calcium-Magnesium-Vitamin D (CALCIUM MAGNESIUM PO) Take by mouth daily. shaklee osteomatrix- contains calsium, magnesium, vitamin d, vitamin k, zinc, copper and manganese       . chlorpheniramine-HYDROcodone (TUSSIONEX PENNKINETIC ER) 10-8 MG/5ML LQCR Take 5 mLs by mouth at bedtime as needed.  140 mL  0  . Cholecalciferol (VITAMIN D) 2000 UNITS CAPS Take 2,000 Units by mouth.        . itraconazole (SPORANOX) 100 MG capsule Take 100 mg by mouth 2 (two) times daily.      . mometasone (ASMANEX) 220 MCG/INH inhaler Inhale 2 puffs into the lungs daily.      . montelukast (SINGULAIR) 10 MG tablet Take 1 tablet (10 mg total) by mouth 2  (two) times daily.  180 tablet  4  . Omega-3 Fatty Acids (OMEGA 3 PO) Take 1 capsule by mouth daily.       Maxwell Caul Bicarbonate (ZEGERID) 20-1100 MG CAPS Take 1 capsule by mouth every morning.  30 each  5  . predniSONE (DELTASONE) 1 MG tablet Take 2mg  x 1 month then continue with 1mg  daily (until further notice).  90 tablet  2  . predniSONE (DELTASONE) 10 MG tablet Take 5 mg by mouth daily.       . rosuvastatin (CRESTOR) 20 MG tablet Take 1 tablet (20 mg total) by mouth every Monday, Wednesday, and Friday.  42 tablet  1  . tiotropium (SPIRIVA) 18 MCG inhalation capsule Place 1 capsule (18 mcg total) into inhaler and inhale daily.  90 capsule  3  . venlafaxine (EFFEXOR) 37.5 MG tablet Take 3 tablets by mouth daily  270 tablet  0    Allergies  Allergen Reactions  . Beclomethasone Dipropionate     REACTION: hives and weight gain  . Budesonide-Formoterol Fumarate     REACTION: gave patient hives  . Mometasone Furo-Formoterol Fum     REACTION: weight gain  and hives  . Sulfonamide Derivatives     REACTION: Rash    Physical Exam:  Filed Vitals:   02/28/12 1322  BP: 104/64  Pulse: 81  Temp: 98.6 F (37 C)  TempSrc: Oral  Height: 5\' 3"  (1.6 m)  Weight: 171 lb 12.8 oz (77.928 kg)  SpO2: 98%    Current Encounter SPO2  02/28/12 1322 98%  02/27/12 1705 99%  02/07/12 1030 100%     Body mass index is 30.43 kg/(m^2). Wt Readings from Last 2 Encounters:  02/28/12 171 lb 12.8 oz (77.928 kg)  02/27/12 170 lb 6.4 oz (77.293 kg)    General - No distress, moon facies  ENT - TM clear, no sinus tenderness, no oral exudate, no LAN, no thyromegaly, MP 3  Cardiac - s1s2 regular, no murmur, pulses symmetric, no edema  Chest - decreased breath sounds, coarse breath sounds b/l, no wheeze  Back - no focal tenderness  Abd - soft, non-tender, no organomegaly, + bowel sounds  Ext - normal motor strength  Neuro - Cranial nerves are normal. PERLA. EOM's intact.  Skin - no discernible  active dermatitis, erythema, urticaria or inflammatory process.  Psych - normal mood, and behavior.   Assessment/Plan:  Coralyn Helling, MD [facility] Pager:  [phone] 02/28/2012, 1:32 PM

## 2012-02-28 NOTE — Assessment & Plan Note (Signed)
Will her AHI is improved compared to 2007, she still has sleep apnea.  She also reports sleep disruption and daytime sleepiness.  I have reviewed the sleep test results with the patient.  Explained how sleep apnea can affect the patient's health.  Driving precautions and importance of weight loss were discussed.  Treatment options for sleep apnea were reviewed.  Will proceed with auto CPAP set up.  Will then re-assess whether she needs therapy for her PLM's.

## 2012-02-29 NOTE — Telephone Encounter (Signed)
LMTCB

## 2012-02-29 NOTE — Assessment & Plan Note (Signed)
Based on our discussion you will start xolair It would be dangerous to come off prednisone abruptly, take 2 mg per day atleast and then in 1 month go to 1mg  per day and continue Then in 3 months we will cut it down even further Reutrn in 3 months or sooner if needed Have flu shot asap - per our discussion, next month   > 50% of this 15 min visit spent in face to face counseling

## 2012-03-01 NOTE — Telephone Encounter (Signed)
Pt has returned call & states if you are unable to reach her on her cell-please try her home number [phone] after 12:30 today.  Victoria [name]

## 2012-03-01 NOTE — Telephone Encounter (Signed)
lmomtcb x1 on mobile. Will call home # after 12:30

## 2012-03-01 NOTE — Telephone Encounter (Signed)
lmomtcb  

## 2012-03-02 ENCOUNTER — Telehealth: Payer: Self-pay | Admitting: Family Medicine

## 2012-03-02 MED ORDER — OMEPRAZOLE-SODIUM BICARBONATE 20-1100 MG PO CAPS: 1.0000 | ORAL_CAPSULE | ORAL | Status: DC

## 2012-03-02 MED ORDER — VENLAFAXINE HCL 37.5 MG PO TABS: ORAL_TABLET | ORAL | Status: DC

## 2012-03-02 MED ORDER — MONTELUKAST SODIUM 10 MG PO TABS: 10.0000 mg | ORAL_TABLET | Freq: Two times a day (BID) | ORAL | Status: DC

## 2012-03-02 MED ORDER — ITRACONAZOLE 100 MG PO CAPS: 100.0000 mg | ORAL_CAPSULE | Freq: Two times a day (BID) | ORAL | Status: DC

## 2012-03-02 NOTE — Telephone Encounter (Signed)
Called and spoke with pt and she is requesting that these meds be sent to [facility].  Pt is aware that these meds have been sent along with the singulair.  Nothing further is needed.

## 2012-03-02 NOTE — Telephone Encounter (Signed)
Refill: Effexor XR 37.5 mg cap. Request received from [facility]

## 2012-03-05 ENCOUNTER — Other Ambulatory Visit (INDEPENDENT_AMBULATORY_CARE_PROVIDER_SITE_OTHER): Payer: Self-pay | Admitting: General Surgery

## 2012-03-06 ENCOUNTER — Telehealth: Payer: Self-pay | Admitting: Internal Medicine

## 2012-03-06 ENCOUNTER — Encounter: Payer: Self-pay | Admitting: *Deleted

## 2012-03-06 NOTE — Telephone Encounter (Signed)
Pt would like to get scheduled for unbilical/abdonimalhernia repair with Dr. Claud Kelp at [facility] soon. She says her asthma is doing well and that she was just seen a week or so ago. MR pls advise if pt is able to have surgery or if she will need another appt with you for this.

## 2012-03-06 NOTE — Telephone Encounter (Signed)
IF patient abdominal incision is lower and/or surgery time <2h, then LOW RISK. Otherwise, Low-Moderat Risk (see way below for scores).  No contraindication for surgery  Surgeon needs to ensure a) stress dose sterpids pre- and post op with hydrocortisone, nebulizers, and dvt prophylaxis and early ambulation and incentive spirometry  I am copying Dr Derrell Lolling on this   [place] Postperative Pulmonary Risk Score 03/06/2012   Type of surgery - abd ao aneurysm (27), thoracic (21), neurosurgery / upper abdominal / vascular (21), neck (11) 0  Emergency Surgery - (11) 0  ALbumin < 3 or poor nutritional state - (9) 0  BUN > 30 -  (8) 0  Partial or completely dependent functional status - (7) 0  COPD -  (6) 6  Age - 60 to 69 (4), > 70  (6) 0  TOTAL 6  Risk Stratifcation scores  - < 10, 11-19, 20-27, 28-40, >40 LOW RISK     [facility] Postperative Pulmonary Risk Score 03/06/2012   Age - <50 (0), 50-80 (3), >80 (16) 3  Preoperative pulse ox - >96 (0), 91-95 (8), <90 (24) 0  Respiratory infection in last month - Yes (17) 0  Preoperative anemia - < 10gm% - Yes (11) 0  Surgical incision - Upper abdominal (15), Thoracic (24) ? 15  Duration of surgery - <2h (0), 2-3h (16), >3h (23) 0 if < 2h  Emergency Surgery - Yes (8) 0        Risk Stratification - Low (<26), Intermediate (26-44), High (>45) LOW-MODERATE

## 2012-03-06 NOTE — Telephone Encounter (Signed)
Pt is aware and I will forward this to Dr. Derrell Lolling @ [facility].

## 2012-03-08 ENCOUNTER — Other Ambulatory Visit: Payer: Self-pay | Admitting: Family Medicine

## 2012-03-08 MED ORDER — VENLAFAXINE HCL 37.5 MG PO TABS: ORAL_TABLET | ORAL | Status: DC

## 2012-03-08 NOTE — Telephone Encounter (Signed)
Venlafaxine 37.5mg  tablet Qty: 270 Take 3 tablets by mouth daily  This presc came through fax not sure if it is where the pt truly wants it from- see note in pt chart

## 2012-03-08 NOTE — Telephone Encounter (Signed)
Patient wanted the Rx to go to [facility]----- Rx faxed      KP

## 2012-03-28 ENCOUNTER — Telehealth: Payer: Self-pay

## 2012-03-28 MED ORDER — VENLAFAXINE HCL ER 37.5 MG PO CP24: ORAL_CAPSULE | ORAL | Status: DC

## 2012-03-28 NOTE — Telephone Encounter (Signed)
Dicussed with patient and she stated she was on the ER of the Effexor and she got the tablet and it is making her sick, she wanted to go back to the XR capsule.  She wanted a Rx sent to [facility] and another sent to [facility].     KP

## 2012-03-28 NOTE — Telephone Encounter (Signed)
Pt states she didn't receive effexER

## 2012-03-28 NOTE — Telephone Encounter (Signed)
I referred pt to Victoria [name] cause she needed to speak with pt.       MW

## 2012-04-02 ENCOUNTER — Encounter (HOSPITAL_COMMUNITY): Payer: Self-pay | Admitting: Pharmacy Technician

## 2012-04-03 ENCOUNTER — Telehealth (INDEPENDENT_AMBULATORY_CARE_PROVIDER_SITE_OTHER): Payer: Self-pay | Admitting: General Surgery

## 2012-04-03 NOTE — Telephone Encounter (Signed)
Called back patient per message left 04/02/12. Advised patient to call back and if unable to reach me, could speak to nurses in triage to answer her questions about upcoming surgery.

## 2012-04-04 ENCOUNTER — Encounter (HOSPITAL_COMMUNITY)
Admission: RE | Admit: 2012-04-04 | Discharge: 2012-04-04 | Disposition: A | Discharge status: Home / Self Care | Payer: Managed Care, Other (non HMO) | Source: Ambulatory Visit | Attending: General Surgery | Admitting: General Surgery

## 2012-04-04 ENCOUNTER — Encounter (HOSPITAL_COMMUNITY): Payer: Self-pay

## 2012-04-04 HISTORY — DX: Anemia, unspecified: D64.9

## 2012-04-04 HISTORY — DX: Allergic bronchopulmonary aspergillosis: B44.81

## 2012-04-04 HISTORY — DX: Reserved for inherently not codable concepts without codable children: IMO0001

## 2012-04-04 LAB — CBC WITH DIFFERENTIAL/PLATELET
Basophils Relative: 0 % (ref 0–1)
Eosinophils Absolute: 0.7 10*3/uL (ref 0.0–0.7)
Eosinophils Relative: 7 % — ABNORMAL HIGH (ref 0–5)
Hemoglobin: 14.1 g/dL (ref 12.0–15.0)
Lymphs Abs: 1.6 10*3/uL (ref 0.7–4.0)
MCH: 30.8 pg (ref 26.0–34.0)
MCHC: 34 g/dL (ref 30.0–36.0)
MCV: 90.6 fL (ref 78.0–100.0)
Monocytes Absolute: 0.6 10*3/uL (ref 0.1–1.0)
Monocytes Relative: 6 % (ref 3–12)
Neutrophils Relative %: 72 % (ref 43–77)
RBC: 4.58 MIL/uL (ref 3.87–5.11)

## 2012-04-04 LAB — URINALYSIS, ROUTINE W REFLEX MICROSCOPIC
Hgb urine dipstick: NEGATIVE
Nitrite: NEGATIVE
Protein, ur: NEGATIVE mg/dL
Specific Gravity, Urine: 1.006 (ref 1.005–1.030)
Urobilinogen, UA: 0.2 mg/dL (ref 0.0–1.0)

## 2012-04-04 LAB — COMPREHENSIVE METABOLIC PANEL
Albumin: 3.9 g/dL (ref 3.5–5.2)
Alkaline Phosphatase: 104 U/L (ref 39–117)
BUN: 9 mg/dL (ref 6–23)
Calcium: 9.3 mg/dL (ref 8.4–10.5)
Creatinine, Ser: 0.65 mg/dL (ref 0.50–1.10)
GFR calc Af Amer: >90 mL/min (ref >90)
Glucose, Bld: 99 mg/dL (ref 70–99)
Potassium: 4.5 mEq/L (ref 3.5–5.1)
Total Protein: 7.2 g/dL (ref 6.0–8.3)

## 2012-04-04 LAB — SURGICAL PCR SCREEN
MRSA, PCR: NEGATIVE
Staphylococcus aureus: NEGATIVE

## 2012-04-04 LAB — PROTIME-INR
INR: 0.9 (ref 0.00–1.49)
Prothrombin Time: 12.1 seconds (ref 11.6–15.2)

## 2012-04-04 NOTE — Pre-Procedure Instructions (Signed)
20 Victoria [name]  04/04/2012   Your procedure is scheduled on:  04/13/12 FRIDAY   Report to [facility] at 530 AM.  Call this number if you have problems the morning of surgery: [phone]   Remember:   Do not eat food OR DRINK :After Midnight.     Take these medicines the morning of surgery with A SIP OF WATER:  ALBUTEROL INHALER, NEBULIZER, SPIRIVA, SINGULAIR,  ZEGERID, PREDNISONE, EFFEXOR (STOP ASPIRIN, COUMADIN, PLAVIX, EFFIENT, HERBAL MEDICINES)   Do not wear jewelry, make-up or nail polish.  Do not wear lotions, powders, or perfumes. You may wear deodorant.  Do not shave 48 hours prior to surgery. Men may shave face and neck.  Do not bring valuables to the hospital.  Contacts, dentures or bridgework may not be worn into surgery.  Leave suitcase in the car. After surgery it may be brought to your room.  For patients admitted to the hospital, checkout time is 11:00 AM the day of discharge.   Patients discharged the day of surgery will not be allowed to drive home.  Name and phone number of your driver:   Special Instructions: Shower using CHG 2 nights before surgery and the night before surgery.  If you shower the day of surgery use CHG.  Use special wash - you have one bottle of CHG for all showers.  You should use approximately 1/3 of the bottle for each shower.   Please read over the following fact sheets that you were given: Pain Booklet, Coughing and Deep Breathing, MRSA Information and Surgical Site Infection Prevention

## 2012-04-11 ENCOUNTER — Telehealth: Payer: Self-pay | Admitting: Internal Medicine

## 2012-04-11 NOTE — Telephone Encounter (Signed)
I spoke with pt and she stated when she wakes up in the mornings x 1 week now w/ dry cough, slight SOB, little wheezing. She then will use her albuterol nebulizer and then is fine for the rest of they day. She is scheduled for surgery Friday morning. She currently takes prednisone 2 mg daily. She is wanting to know if she can have a burst of prednisone to help with this so she can have her surgery. Please advise MR thanks

## 2012-04-11 NOTE — H&P (Signed)
Viriginia [name]     MRN: [id number]   Description: 56 year old female  Provider: [name], MD  Department: [facility]        Diagnoses     Ventral hernia   - Primary    553.20       Vitals -    BP Pulse Temp Resp Ht Wt    118/66 64 98.6 F (37 C) (Temporal) 16 5\' 3"  (1.6 m) 170 lb 9.6 oz (77.384 kg   BMI - 30.22 kg/m2                 History and Physical     [name], MD   Patient ID: Victoria [name], female   DOB: August 19, 1955, 56 y.o.   MRN: [id number]                HPI Madalynne Gutmann is a 56 y.o. female.  She returns for further evaluation of her abdominal wall hernia.   This patient has a ventral hernia about 5 cm above the umbilicus containing fat only. It is starting to cause some more pain. A CT scan shows  may be a little bit larger but contains fat only.   She also has a hiatal hernia and a Bochdalek hernia. She has reflux symptoms that are completely controlled by omeprazole. She has never had evaluation of this by GI. She is not very symptomatic from that.   CT scan also shows small bilateral inguinal hernias containing fat only. She has no symptoms in her groins.   She has Cushing's syndrome and is on prednisone. She was hospitalized in January with bilateral pneumonia. She is being treated for aspergillosis. She had a cardiac workup in June by [facility] and that was negative. She has asthma. She has depression.   She states that pulmonary-wise, she feels better than she has a long time. She remains overweight. She is here with her husband today to talk about whether any of the hernias need to be operated on.     Past Medical History   Diagnosis  Date   .  Asthma     .  Obesity     .  Maxillary sinusitis     .  Depression     .  Osteoarthritis     .  IBS (irritable bowel syndrome)     .  Hyperlipidemia     .  COPD (chronic obstructive pulmonary disease)     .  Pulmonary nodules     .  OSA (obstructive sleep apnea)       .  Osteoporosis     .  Nasal congestion  9/12       current cold   .  Cough     .  Wheezing     .  Shortness of breath     .  Pneumonia     .  GERD (gastroesophageal reflux disease)     .  H/O hiatal hernia     .  Headache         Past Surgical History   Procedure  Date   .  Appendectomy     .  Cesarean section         Family History   Problem  Relation  Age of Onset   .  Breast cancer  Mother     .  Prostate cancer  Father     .  Diverticulosis  Father     .  Pulmonary embolism  Brother        Social History History   Substance Use Topics   .  Smoking status:  Never Smoker    .  Smokeless tobacco:  Never Used   .  Alcohol Use:  2.4 oz/week       2 Glasses of wine, 2 Cans of beer per week       Allergies   Allergen  Reactions   .  Beclomethasone Dipropionate         REACTION: hives and weight gain   .  Budesonide-Formoterol Fumarate         REACTION: gave patient hives   .  Mometasone Furo-Formoterol Fum         REACTION: weight gain and hives   .  Sulfonamide Derivatives         REACTION: Rash       Current Outpatient Prescriptions   Medication  Sig  Dispense  Refill   .  albuterol (PROAIR HFA) 108 (90 BASE) MCG/ACT inhaler  Inhale 2 puffs into the lungs every 6 (six) hours as needed.           Marland Kitchen  albuterol (PROVENTIL) (2.5 MG/3ML) 0.083% nebulizer solution  Take 3 mLs (2.5 mg total) by nebulization every 6 (six) hours as needed for wheezing.   75 mL   12   .  aspirin 81 MG tablet  Take 81 mg by mouth daily.          .  Calcium-Magnesium-Vitamin D (CALCIUM MAGNESIUM PO)  Take by mouth daily. shaklee osteomatrix- contains calsium, magnesium, vitamin d, vitamin k, zinc, copper and manganese          .  chlorpheniramine-HYDROcodone (TUSSIONEX PENNKINETIC ER) 10-8 MG/5ML LQCR  Take 5 mLs by mouth at bedtime as needed.   140 mL   0   .  Cholecalciferol (VITAMIN D) 2000 UNITS CAPS  Take 2,000 Units by mouth.           .  itraconazole (SPORANOX) 100 MG capsule   Take 100 mg by mouth 2 (two) times daily.         .  mometasone (ASMANEX) 220 MCG/INH inhaler  Inhale 2 puffs into the lungs daily.         .  montelukast (SINGULAIR) 10 MG tablet  Take 1 tablet (10 mg total) by mouth 2 (two) times daily.   180 tablet   4   .  Omega-3 Fatty Acids (OMEGA 3 PO)  Take 1 capsule by mouth daily.          Maxwell Caul Bicarbonate (ZEGERID) 20-1100 MG CAPS  Take 1 capsule by mouth every morning.   30 each   5   .  predniSONE (DELTASONE) 10 MG tablet  Take 5 mg by mouth daily.          .  rosuvastatin (CRESTOR) 20 MG tablet  Take 1 tablet (20 mg total) by mouth every Monday, Wednesday, and Friday.   42 tablet   1   .  tiotropium (SPIRIVA) 18 MCG inhalation capsule  Place 1 capsule (18 mcg total) into inhaler and inhale daily.   90 capsule   3   .  venlafaxine (EFFEXOR) 37.5 MG tablet  Take 3 tablets by mouth daily   270 tablet   0      Review of Systems  Constitutional: Negative for fever, chills and unexpected weight change.  HENT: Negative for hearing loss, congestion, sore  throat, trouble swallowing and voice change.   Eyes: Negative for visual disturbance.  Respiratory: Negative for cough and wheezing.   Cardiovascular: Negative for chest pain, palpitations and leg swelling.  Gastrointestinal: Positive for abdominal pain. Negative for nausea, vomiting, diarrhea, constipation, blood in stool, abdominal distention and anal bleeding.  Genitourinary: Negative for hematuria, vaginal bleeding and difficulty urinating.  Musculoskeletal: Negative for arthralgias.  Skin: Negative for rash and wound.  Neurological: Negative for seizures, syncope and headaches.  Hematological: Negative for adenopathy. Does not bruise/bleed easily.  Psychiatric/Behavioral: Negative for confusion.    Blood pressure 118/66, pulse 64, temperature 98.6 F (37 C), temperature source Temporal, resp. rate 16, height 5\' 3"  (1.6 m), weight 170 lb 9.6 oz (77.384 kg).   Physical Exam    Constitutional: She is oriented to person, place, and time. She appears well-developed and well-nourished. No distress.       Central obesity.  HENT:   Head: Normocephalic and atraumatic.   Nose: Nose normal.   Mouth/Throat: No oropharyngeal exudate.  Eyes: Conjunctivae and EOM are normal. Pupils are equal, round, and reactive to light. Left eye exhibits no discharge. No scleral icterus.  Neck: Neck supple. No JVD present. No tracheal deviation present. No thyromegaly present.  Cardiovascular: Normal rate, regular rhythm, normal heart sounds and intact distal pulses.    No murmur heard. Pulmonary/Chest: Effort normal and breath sounds normal. No respiratory distress. She has no wheezes. She has no rales. She exhibits no tenderness.  Abdominal: Soft. Bowel sounds are normal. She exhibits no distension and no mass. There is no tenderness. There is no rebound and no guarding.       She has a mostly reducible ventral hernia in the midline about 5 cm above the umbilicus. There are  no scars on her abdomen.  Genitourinary:       I cannot detect an inguinal hernia on either side.  Musculoskeletal: She exhibits no edema and no tenderness.  Lymphadenopathy:    She has no cervical adenopathy.  Neurological: She is alert and oriented to person, place, and time. She exhibits normal muscle tone. Coordination normal.  Skin: Skin is warm. No rash noted. She is not diaphoretic. No erythema. No pallor.  Psychiatric: She has a normal mood and affect. Her behavior is normal. Judgment and thought content normal.    Data Reviewed Lots of hospital records. CT scans. Mild records.   Assessment Symptomatic ventral hernia, supraumbilical, containing fat. This is her main complaint.   No clinical evidence of inguinal hernia.   GERD, well controlled on proton pump inhibitors. I do not think that she needs hiatal hernia repair   Cushing syndrome, on chronic low-dose  prednisone   Asthma   Aspergillosis of lungs,   Hospitalization for bilateral pneumonia, Junior 2013.   Plan She was advised to consider elective repair of her ventral hernia with mesh. I told her  that because of her obesity and immunosuppression that laparoscopic approach would be my preference although it might have to be converted to open.   I advised against any surgery for hiatal hernia or inguinal hernia.   I discussed the natural history of her ventral hernia. She's going to go home and think about this and call me back when she is ready to have surgery.   When she is ready to have this repaired, we will ask Dr. Marchelle Gearing of [facility] to get medical clearance. ADDENDUM(03/09/2012):  Dr. Marchelle Gearing sent note, advises acceptable risk stratification; advises stress dose steroids  pre/post op and DVT prophylaxis, usual nebulizers and pulmonary toilet.       Angelia Mould. Derrell Lolling, M.D., [facility] General and Minimally invasive Surgery Breast and Colorectal Surgery Office:   [phone] Pager:   [phone]

## 2012-04-11 NOTE — Telephone Encounter (Signed)
Spoke to patient  - increase to prednisone 5mg  per day slightly above basal dose in anticipation of surgeyr  - instructed her to tell surgeon and anesthesia to give her stress dose steroids   Copying message to Dr Derrell Lolling  Dr. Kalman Shan, M.D., [facility].C.P Pulmonary and Critical Care Medicine Staff Physician [facility] Pager: [phone], If no answer or between  15:00h - 7:00h: call [phone]  04/11/2012 5:46 PM

## 2012-04-12 MED ORDER — DEXTROSE 5 % IV SOLN
3.0000 g | INTRAVENOUS | Status: AC
  Administered 2012-04-13: 3 g via INTRAVENOUS
  Filled 2012-04-12: qty 3000

## 2012-04-12 MED ORDER — HEPARIN SODIUM (PORCINE) 5000 UNIT/ML IJ SOLN
5000.0000 [IU] | Freq: Once | INTRAMUSCULAR | Status: AC
  Administered 2012-04-13: 5000 [IU] via SUBCUTANEOUS
  Filled 2012-04-12: qty 1

## 2012-04-12 MED ORDER — CHLORHEXIDINE GLUCONATE 4 % EX LIQD: 1.0000 "application " | Freq: Once | CUTANEOUS | Status: DC

## 2012-04-13 ENCOUNTER — Encounter (HOSPITAL_COMMUNITY): Payer: Self-pay | Admitting: *Deleted

## 2012-04-13 ENCOUNTER — Encounter (HOSPITAL_COMMUNITY): Payer: Self-pay | Admitting: Anesthesiology

## 2012-04-13 ENCOUNTER — Encounter (HOSPITAL_COMMUNITY): Payer: Self-pay | Admitting: General Practice

## 2012-04-13 ENCOUNTER — Observation Stay (HOSPITAL_COMMUNITY)
Admission: RE | Admit: 2012-04-13 | Discharge: 2012-04-15 | DRG: 354 | Disposition: A | Discharge status: Home Health | Payer: Managed Care, Other (non HMO) | Source: Ambulatory Visit | Attending: General Surgery | Admitting: General Surgery

## 2012-04-13 ENCOUNTER — Encounter (HOSPITAL_COMMUNITY)
Admission: RE | Disposition: A | Discharge status: Home Health | Payer: Self-pay | Source: Ambulatory Visit | Attending: General Surgery

## 2012-04-13 ENCOUNTER — Ambulatory Visit (HOSPITAL_COMMUNITY): Payer: Managed Care, Other (non HMO) | Admitting: Anesthesiology

## 2012-04-13 DIAGNOSIS — M810 Age-related osteoporosis without current pathological fracture: Secondary | ICD-10-CM | POA: Insufficient documentation

## 2012-04-13 DIAGNOSIS — K219 Gastro-esophageal reflux disease without esophagitis: Secondary | ICD-10-CM | POA: Insufficient documentation

## 2012-04-13 DIAGNOSIS — Z01812 Encounter for preprocedural laboratory examination: Secondary | ICD-10-CM | POA: Insufficient documentation

## 2012-04-13 DIAGNOSIS — K449 Diaphragmatic hernia without obstruction or gangrene: Secondary | ICD-10-CM | POA: Insufficient documentation

## 2012-04-13 DIAGNOSIS — K439 Ventral hernia without obstruction or gangrene: Principal | ICD-10-CM | POA: Insufficient documentation

## 2012-04-13 DIAGNOSIS — IMO0002 Reserved for concepts with insufficient information to code with codable children: Secondary | ICD-10-CM | POA: Insufficient documentation

## 2012-04-13 DIAGNOSIS — F329 Major depressive disorder, single episode, unspecified: Secondary | ICD-10-CM | POA: Insufficient documentation

## 2012-04-13 DIAGNOSIS — F3289 Other specified depressive episodes: Secondary | ICD-10-CM | POA: Insufficient documentation

## 2012-04-13 DIAGNOSIS — J302 Other seasonal allergic rhinitis: Secondary | ICD-10-CM

## 2012-04-13 DIAGNOSIS — E249 Cushing's syndrome, unspecified: Secondary | ICD-10-CM | POA: Insufficient documentation

## 2012-04-13 DIAGNOSIS — K4020 Bilateral inguinal hernia, without obstruction or gangrene, not specified as recurrent: Secondary | ICD-10-CM | POA: Insufficient documentation

## 2012-04-13 DIAGNOSIS — B449 Aspergillosis, unspecified: Secondary | ICD-10-CM | POA: Insufficient documentation

## 2012-04-13 DIAGNOSIS — J45909 Unspecified asthma, uncomplicated: Secondary | ICD-10-CM | POA: Insufficient documentation

## 2012-04-13 DIAGNOSIS — M1990 Unspecified osteoarthritis, unspecified site: Secondary | ICD-10-CM | POA: Insufficient documentation

## 2012-04-13 DIAGNOSIS — G4730 Sleep apnea, unspecified: Secondary | ICD-10-CM | POA: Insufficient documentation

## 2012-04-13 HISTORY — PX: VENTRAL HERNIA REPAIR: SHX424

## 2012-04-13 HISTORY — DX: Anxiety disorder, unspecified: F41.9

## 2012-04-13 HISTORY — DX: Unspecified chronic bronchitis: J42

## 2012-04-13 HISTORY — PX: HERNIA REPAIR: SHX51

## 2012-04-13 LAB — BLOOD GAS, ARTERIAL
Bicarbonate: 26.5 mEq/L — ABNORMAL HIGH (ref 20.0–24.0)
Drawn by: 244801
O2 Saturation: 95.9 %
Patient temperature: 98.6
pH, Arterial: 7.29 — ABNORMAL LOW (ref 7.350–7.450)

## 2012-04-13 SURGERY — REPAIR, HERNIA, VENTRAL, LAPAROSCOPIC
Anesthesia: General | Site: Abdomen | Wound class: Clean

## 2012-04-13 MED ORDER — HYDROCOD POLST-CHLORPHEN POLST 10-8 MG/5ML PO LQCR: 5.0000 mL | Freq: Every evening | ORAL | Status: DC | PRN

## 2012-04-13 MED ORDER — POLYETHYLENE GLYCOL 3350 17 G PO PACK
17.0000 g | PACK | Freq: Every day | ORAL | Status: DC | PRN
  Filled 2012-04-13: qty 1

## 2012-04-13 MED ORDER — GLYCOPYRROLATE 0.2 MG/ML IJ SOLN
INTRAMUSCULAR | Status: DC | PRN
  Administered 2012-04-13: 0.4 mg via INTRAVENOUS

## 2012-04-13 MED ORDER — PROPOFOL 10 MG/ML IV BOLUS
INTRAVENOUS | Status: DC | PRN
  Administered 2012-04-13: 200 mg via INTRAVENOUS

## 2012-04-13 MED ORDER — SODIUM CHLORIDE 0.9 % IR SOLN
Status: DC | PRN
  Administered 2012-04-13: 1000 mL

## 2012-04-13 MED ORDER — ONDANSETRON HCL 4 MG/2ML IJ SOLN
INTRAMUSCULAR | Status: DC | PRN
  Administered 2012-04-13: 4 mg via INTRAVENOUS

## 2012-04-13 MED ORDER — MIDAZOLAM HCL 5 MG/5ML IJ SOLN
INTRAMUSCULAR | Status: DC | PRN
  Administered 2012-04-13: 2 mg via INTRAVENOUS

## 2012-04-13 MED ORDER — HYDROMORPHONE HCL PF 1 MG/ML IJ SOLN
0.2500 mg | INTRAMUSCULAR | Status: DC | PRN
  Administered 2012-04-13 (×3): 0.5 mg via INTRAVENOUS

## 2012-04-13 MED ORDER — FLUTICASONE PROPIONATE HFA 44 MCG/ACT IN AERO
2.0000 | INHALATION_SPRAY | Freq: Two times a day (BID) | RESPIRATORY_TRACT | Status: DC
  Administered 2012-04-13 – 2012-04-15 (×4): 2 via RESPIRATORY_TRACT
  Filled 2012-04-13: qty 10.6

## 2012-04-13 MED ORDER — CEFAZOLIN SODIUM-DEXTROSE 2-3 GM-% IV SOLR
2.0000 g | Freq: Three times a day (TID) | INTRAVENOUS | Status: AC
  Administered 2012-04-13 – 2012-04-14 (×3): 2 g via INTRAVENOUS
  Filled 2012-04-13 (×4): qty 50

## 2012-04-13 MED ORDER — OXYCODONE HCL 5 MG/5ML PO SOLN: 5.0000 mg | Freq: Once | ORAL | Status: DC | PRN

## 2012-04-13 MED ORDER — OXYCODONE HCL 5 MG PO TABS: 5.0000 mg | ORAL_TABLET | Freq: Once | ORAL | Status: DC | PRN

## 2012-04-13 MED ORDER — HYDROMORPHONE HCL PF 1 MG/ML IJ SOLN
INTRAMUSCULAR | Status: AC
  Filled 2012-04-13: qty 2

## 2012-04-13 MED ORDER — ITRACONAZOLE 100 MG PO CAPS
100.0000 mg | ORAL_CAPSULE | Freq: Two times a day (BID) | ORAL | Status: DC
  Filled 2012-04-13: qty 1

## 2012-04-13 MED ORDER — ROCURONIUM BROMIDE 100 MG/10ML IV SOLN
INTRAVENOUS | Status: DC | PRN
  Administered 2012-04-13: 50 mg via INTRAVENOUS

## 2012-04-13 MED ORDER — BUPIVACAINE-EPINEPHRINE PF 0.25-1:200000 % IJ SOLN
INTRAMUSCULAR | Status: AC
  Filled 2012-04-13: qty 30

## 2012-04-13 MED ORDER — HYDROMORPHONE HCL PF 1 MG/ML IJ SOLN
INTRAMUSCULAR | Status: DC | PRN
  Administered 2012-04-13 (×2): 0.5 mg via INTRAVENOUS

## 2012-04-13 MED ORDER — NEOSTIGMINE METHYLSULFATE 1 MG/ML IJ SOLN
INTRAMUSCULAR | Status: DC | PRN
  Administered 2012-04-13: 4 mg via INTRAVENOUS

## 2012-04-13 MED ORDER — POTASSIUM CHLORIDE IN NACL 20-0.9 MEQ/L-% IV SOLN
INTRAVENOUS | Status: DC
  Administered 2012-04-13: 18:00:00 via INTRAVENOUS
  Administered 2012-04-14: 20 mL via INTRAVENOUS
  Administered 2012-04-14: 05:00:00 via INTRAVENOUS
  Filled 2012-04-13 (×4): qty 1000

## 2012-04-13 MED ORDER — BUPIVACAINE-EPINEPHRINE 0.25% -1:200000 IJ SOLN
INTRAMUSCULAR | Status: DC | PRN
  Administered 2012-04-13: 30 mL

## 2012-04-13 MED ORDER — PREDNISONE 5 MG PO TABS
5.0000 mg | ORAL_TABLET | Freq: Every day | ORAL | Status: DC
  Administered 2012-04-14 – 2012-04-15 (×2): 5 mg via ORAL
  Filled 2012-04-13 (×3): qty 1

## 2012-04-13 MED ORDER — VENLAFAXINE HCL ER 75 MG PO CP24
112.5000 mg | ORAL_CAPSULE | Freq: Every day | ORAL | Status: DC
  Administered 2012-04-14 – 2012-04-15 (×2): 112.5 mg via ORAL
  Filled 2012-04-13 (×2): qty 1

## 2012-04-13 MED ORDER — HYDROCORTISONE NICU INJ SYRINGE 50 MG/ML: 50.0000 mg | Freq: Once | INTRAVENOUS | Status: DC

## 2012-04-13 MED ORDER — ROSUVASTATIN CALCIUM 20 MG PO TABS
20.0000 mg | ORAL_TABLET | ORAL | Status: DC
  Administered 2012-04-13: 20 mg via ORAL
  Filled 2012-04-13: qty 1

## 2012-04-13 MED ORDER — ALBUTEROL SULFATE HFA 108 (90 BASE) MCG/ACT IN AERS
INHALATION_SPRAY | RESPIRATORY_TRACT | Status: DC | PRN
  Administered 2012-04-13: 6 via RESPIRATORY_TRACT

## 2012-04-13 MED ORDER — PANTOPRAZOLE SODIUM 40 MG PO TBEC
40.0000 mg | DELAYED_RELEASE_TABLET | Freq: Every day | ORAL | Status: DC
  Administered 2012-04-14 – 2012-04-15 (×2): 40 mg via ORAL
  Filled 2012-04-13 (×2): qty 1

## 2012-04-13 MED ORDER — HEPARIN SODIUM (PORCINE) 5000 UNIT/ML IJ SOLN
5000.0000 [IU] | Freq: Three times a day (TID) | INTRAMUSCULAR | Status: DC
  Administered 2012-04-14 – 2012-04-15 (×5): 5000 [IU] via SUBCUTANEOUS
  Filled 2012-04-13 (×7): qty 1

## 2012-04-13 MED ORDER — ALBUTEROL SULFATE (5 MG/ML) 0.5% IN NEBU: 2.5000 mg | INHALATION_SOLUTION | RESPIRATORY_TRACT | Status: DC | PRN

## 2012-04-13 MED ORDER — ONDANSETRON HCL 4 MG/2ML IJ SOLN: 4.0000 mg | Freq: Four times a day (QID) | INTRAMUSCULAR | Status: DC | PRN

## 2012-04-13 MED ORDER — LIDOCAINE HCL 1 % IJ SOLN
INTRAMUSCULAR | Status: DC | PRN
  Administered 2012-04-13: 100 mg via INTRADERMAL

## 2012-04-13 MED ORDER — FENTANYL CITRATE 0.05 MG/ML IJ SOLN
INTRAMUSCULAR | Status: DC | PRN
  Administered 2012-04-13: 50 ug via INTRAVENOUS
  Administered 2012-04-13: 100 ug via INTRAVENOUS
  Administered 2012-04-13 (×2): 50 ug via INTRAVENOUS

## 2012-04-13 MED ORDER — TIOTROPIUM BROMIDE MONOHYDRATE 18 MCG IN CAPS
18.0000 ug | ORAL_CAPSULE | Freq: Every day | RESPIRATORY_TRACT | Status: DC
  Administered 2012-04-14 – 2012-04-15 (×2): 18 ug via RESPIRATORY_TRACT
  Filled 2012-04-13: qty 5

## 2012-04-13 MED ORDER — ITRACONAZOLE 100 MG PO CAPS
100.0000 mg | ORAL_CAPSULE | Freq: Two times a day (BID) | ORAL | Status: DC
  Administered 2012-04-13 – 2012-04-15 (×4): 100 mg via ORAL
  Filled 2012-04-13 (×5): qty 1

## 2012-04-13 MED ORDER — METOCLOPRAMIDE HCL 5 MG/ML IJ SOLN
10.0000 mg | Freq: Once | INTRAMUSCULAR | Status: DC | PRN
Start: 2012-04-13 — End: 2012-04-13

## 2012-04-13 MED ORDER — MORPHINE SULFATE 2 MG/ML IJ SOLN
2.0000 mg | INTRAMUSCULAR | Status: DC | PRN
  Administered 2012-04-13 – 2012-04-14 (×4): 2 mg via INTRAVENOUS
  Filled 2012-04-13 (×4): qty 1

## 2012-04-13 MED ORDER — ONDANSETRON HCL 4 MG PO TABS: 4.0000 mg | ORAL_TABLET | Freq: Four times a day (QID) | ORAL | Status: DC | PRN

## 2012-04-13 MED ORDER — HYDROCORTISONE SOD SUCCINATE 1000 MG IJ SOLR
INTRAMUSCULAR | Status: DC | PRN
  Administered 2012-04-13: 100 mg via INTRAVENOUS

## 2012-04-13 MED ORDER — LACTATED RINGERS IV SOLN
INTRAVENOUS | Status: DC | PRN
  Administered 2012-04-13 (×2): via INTRAVENOUS

## 2012-04-13 MED ORDER — ATORVASTATIN CALCIUM 10 MG PO TABS: 10.0000 mg | ORAL_TABLET | Freq: Every day | ORAL | Status: DC

## 2012-04-13 MED ORDER — ALBUTEROL SULFATE (5 MG/ML) 0.5% IN NEBU: 2.5000 mg | INHALATION_SOLUTION | Freq: Four times a day (QID) | RESPIRATORY_TRACT | Status: DC

## 2012-04-13 MED ORDER — HYDROCORTISONE SOD SUCCINATE 100 MG IJ SOLR
50.0000 mg | Freq: Once | INTRAMUSCULAR | Status: AC
  Administered 2012-04-13: 50 mg via INTRAVENOUS
  Filled 2012-04-13: qty 1

## 2012-04-13 MED ORDER — ALBUTEROL SULFATE HFA 108 (90 BASE) MCG/ACT IN AERS: 2.0000 | INHALATION_SPRAY | Freq: Four times a day (QID) | RESPIRATORY_TRACT | Status: DC | PRN

## 2012-04-13 MED ORDER — MONTELUKAST SODIUM 10 MG PO TABS
10.0000 mg | ORAL_TABLET | Freq: Two times a day (BID) | ORAL | Status: DC
  Administered 2012-04-13 – 2012-04-15 (×4): 10 mg via ORAL
  Filled 2012-04-13 (×5): qty 1

## 2012-04-13 MED ORDER — HYDROCODONE-ACETAMINOPHEN 5-325 MG PO TABS
1.0000 | ORAL_TABLET | ORAL | Status: DC | PRN
  Administered 2012-04-13 – 2012-04-14 (×3): 2 via ORAL
  Administered 2012-04-14 (×2): 1 via ORAL
  Administered 2012-04-14 – 2012-04-15 (×2): 2 via ORAL
  Administered 2012-04-15: 1 via ORAL
  Filled 2012-04-13: qty 2
  Filled 2012-04-13 (×2): qty 1
  Filled 2012-04-13 (×3): qty 2
  Filled 2012-04-13: qty 1
  Filled 2012-04-13: qty 2

## 2012-04-13 MED ORDER — KETOROLAC TROMETHAMINE 30 MG/ML IJ SOLN
INTRAMUSCULAR | Status: DC | PRN
  Administered 2012-04-13: 30 mg via INTRAVENOUS

## 2012-04-13 SURGICAL SUPPLY — 51 items
ADH SKN CLS APL DERMABOND .7 (GAUZE/BANDAGES/DRESSINGS) ×1
APPLIER CLIP LOGIC TI 5 (MISCELLANEOUS) IMPLANT
APR CLP MED LRG 33X5 (MISCELLANEOUS)
BAG SPEC RTRVL LRG 6X4 10 (ENDOMECHANICALS) ×1
BINDER ABD UNIV 12 45-62 (WOUND CARE) IMPLANT
BINDER ABDOMINAL 46IN 62IN (WOUND CARE) ×2
BLADE SURG ROTATE 9660 (MISCELLANEOUS) IMPLANT
CANISTER SUCTION 2500CC (MISCELLANEOUS) ×1 IMPLANT
CHLORAPREP W/TINT 26ML (MISCELLANEOUS) ×2 IMPLANT
CLOTH BEACON ORANGE TIMEOUT ST (SAFETY) ×2 IMPLANT
COVER SURGICAL LIGHT HANDLE (MISCELLANEOUS) ×2 IMPLANT
DECANTER SPIKE VIAL GLASS SM (MISCELLANEOUS) ×2 IMPLANT
DERMABOND ADVANCED (GAUZE/BANDAGES/DRESSINGS) ×1
DERMABOND ADVANCED .7 DNX12 (GAUZE/BANDAGES/DRESSINGS) ×1 IMPLANT
DEVICE SECURE STRAP 25 ABSORB (INSTRUMENTS) ×2 IMPLANT
DEVICE TROCAR PUNCTURE CLOSURE (ENDOMECHANICALS) ×2 IMPLANT
DRAPE UTILITY 15X26 W/TAPE STR (DRAPE) ×4 IMPLANT
DRSG PAD ABDOMINAL 8X10 ST (GAUZE/BANDAGES/DRESSINGS) ×4 IMPLANT
ELECT REM PT RETURN 9FT ADLT (ELECTROSURGICAL) ×2
ELECTRODE REM PT RTRN 9FT ADLT (ELECTROSURGICAL) ×1 IMPLANT
GLOVE BIOGEL PI IND STRL 6.5 (GLOVE) IMPLANT
GLOVE BIOGEL PI IND STRL 7.0 (GLOVE) IMPLANT
GLOVE BIOGEL PI INDICATOR 6.5 (GLOVE) ×1
GLOVE BIOGEL PI INDICATOR 7.0 (GLOVE) ×1
GLOVE EUDERMIC 7 POWDERFREE (GLOVE) ×2 IMPLANT
GOWN PREVENTION PLUS XLARGE (GOWN DISPOSABLE) ×2 IMPLANT
GOWN STRL NON-REIN LRG LVL3 (GOWN DISPOSABLE) ×4 IMPLANT
KIT BASIN OR (CUSTOM PROCEDURE TRAY) ×2 IMPLANT
KIT ROOM TURNOVER OR (KITS) ×2 IMPLANT
MARKER SKIN DUAL TIP RULER LAB (MISCELLANEOUS) ×1 IMPLANT
MESH PARIETEX 20X15 (Mesh General) ×1 IMPLANT
NDL SPNL 22GX3.5 QUINCKE BK (NEEDLE) ×1 IMPLANT
NEEDLE SPNL 22GX3.5 QUINCKE BK (NEEDLE) ×2 IMPLANT
NS IRRIG 1000ML POUR BTL (IV SOLUTION) ×2 IMPLANT
PAD ARMBOARD 7.5X6 YLW CONV (MISCELLANEOUS) ×4 IMPLANT
PEN SKIN MARKING BROAD (MISCELLANEOUS) ×2 IMPLANT
POUCH SPECIMEN RETRIEVAL 10MM (ENDOMECHANICALS) ×1 IMPLANT
SCALPEL HARMONIC ACE (MISCELLANEOUS) IMPLANT
SCISSORS LAP 5X35 DISP (ENDOMECHANICALS) IMPLANT
SET IRRIG TUBING LAPAROSCOPIC (IRRIGATION / IRRIGATOR) IMPLANT
SLEEVE ENDOPATH XCEL 5M (ENDOMECHANICALS) ×5 IMPLANT
SUT MNCRL AB 4-0 PS2 18 (SUTURE) ×3 IMPLANT
SUT NOVA NAB DX-16 0-1 5-0 T12 (SUTURE) ×2 IMPLANT
TACKER 5MM HERNIA 3.5CML NAB (ENDOMECHANICALS) ×2 IMPLANT
TOWEL OR 17X24 6PK STRL BLUE (TOWEL DISPOSABLE) ×2 IMPLANT
TOWEL OR 17X26 10 PK STRL BLUE (TOWEL DISPOSABLE) ×2 IMPLANT
TRAY FOLEY CATH 14FRSI W/METER (CATHETERS) IMPLANT
TRAY LAPAROSCOPIC (CUSTOM PROCEDURE TRAY) ×2 IMPLANT
TROCAR XCEL NON-BLD 11X100MML (ENDOMECHANICALS) IMPLANT
TROCAR XCEL NON-BLD 5MMX100MML (ENDOMECHANICALS) ×2 IMPLANT
WATER STERILE IRR 1000ML POUR (IV SOLUTION) IMPLANT

## 2012-04-13 NOTE — Interval H&P Note (Signed)
History and Physical Interval Note:  04/13/2012 7:27 AM  Victoria [name]  has presented today for surgery, with the diagnosis of incarcerated ventral hernia  The goals and the various methods of treatment have been discussed with the patient and family. After consideration of risks, benefits and other options for treatment, the patient has consented to  Procedure(s) (LRB) with comments: LAPAROSCOPIC VENTRAL HERNIA (N/A) - laparoscopic repair of incarcerated naturral hernia possible open as a surgical intervention .  The patient's history has been reviewed, patient examined today,   no change in status, stable for surgery.  I have reviewed the patient's chart and labs.  Questions were answered to the patient's satisfaction.     [name]

## 2012-04-13 NOTE — Anesthesia Postprocedure Evaluation (Signed)
  Anesthesia Post-op Note  Patient: Victoria [name]  Procedure(s) Performed: Procedure(s) (LRB) with comments: LAPAROSCOPIC VENTRAL HERNIA (N/A) - laparoscopic repair of incarcerated hernia  Patient Location: PACU  Anesthesia Type: General  Level of Consciousness: awake and sedated  Airway and Oxygen Therapy: Patient Spontanous Breathing  Post-op Pain: none  Post-op Assessment: Post-op Vital signs reviewed, Patient's Cardiovascular Status Stable, Respiratory Function Stable, Patent Airway and No signs of Nausea or vomiting  Post-op Vital Signs: Reviewed and stable  Complications: No apparent anesthesia complications

## 2012-04-13 NOTE — Anesthesia Preprocedure Evaluation (Addendum)
Anesthesia Evaluation  Patient identified by MRN, date of birth, ID band Patient awake    Reviewed: Allergy & Precautions, H&P , NPO status , Patient's Chart, lab work & pertinent test results, reviewed documented beta blocker date and time   History of Anesthesia Complications Negative for: history of anesthetic complications  Airway Mallampati: II TM Distance: >3 FB Neck ROM: full    Dental  (+) Teeth Intact, Caps and Dental Advisory Given   Pulmonary shortness of breath, asthma , sleep apnea (not compliant with CPAP) , pneumonia - (January 2013), resolved, COPD COPD inhaler,  + rhonchi         Cardiovascular negative cardio ROS  Rhythm:regular  Cushing dz Negative stress test 11/2011   Neuro/Psych  Headaches, PSYCHIATRIC DISORDERS Depression    GI/Hepatic Neg liver ROS, hiatal hernia, GERD-  Medicated and Controlled,Ventral hernia   Endo/Other  negative endocrine ROSMorbid obesityChronic steroid admin  Renal/GU negative Renal ROS  negative genitourinary   Musculoskeletal  (+) Arthritis -, Osteoarthritis,    Abdominal (+) + obese,   Peds  Hematology negative hematology ROS (+)   Anesthesia Other Findings See surgeon's H&P   Reproductive/Obstetrics negative OB ROS                        Anesthesia Physical Anesthesia Plan  ASA: III  Anesthesia Plan: General   Post-op Pain Management:    Induction: Intravenous  Airway Management Planned: Oral ETT  Additional Equipment:   Intra-op Plan:   Post-operative Plan: Extubation in OR  Informed Consent: I have reviewed the patients History and Physical, chart, labs and discussed the procedure including the risks, benefits and alternatives for the proposed anesthesia with the patient or authorized representative who has indicated his/her understanding and acceptance.   Dental Advisory Given  Plan Discussed with: CRNA and  Surgeon  Anesthesia Plan Comments:         Anesthesia Quick Evaluation

## 2012-04-13 NOTE — Transfer of Care (Signed)
Immediate Anesthesia Transfer of Care Note  Patient: Victoria [name]  Procedure(s) Performed: Procedure(s) (LRB) with comments: LAPAROSCOPIC VENTRAL HERNIA (N/A) - laparoscopic repair of incarcerated hernia  Patient Location: PACU  Anesthesia Type: General  Level of Consciousness: awake, alert , oriented and patient cooperative  Airway & Oxygen Therapy: Patient Spontanous Breathing and Patient connected to face mask oxygen  Post-op Assessment: Report given to PACU RN and Post -op Vital signs reviewed and stable  Post vital signs: Reviewed and stable  Complications: No apparent anesthesia complications

## 2012-04-13 NOTE — Op Note (Signed)
Patient Name:           Victoria [name]   Date of Surgery:        04/13/2012  Pre op Diagnosis:      Multiple ventral hernias  Post op Diagnosis:    Same  Procedure:                 Laparoscopic repair of multiple ventral hernias with mesh  Surgeon:                     Angelia Mould. Derrell Lolling, M.D., FACS  Assistant:                      None  Operative Indications:   Victoria [name] is a 56 y.o. female.  This patient has a ventral hernia about 5 cm above the umbilicus containing fat only, A she also has a hernia and a bulge at the umbilicus. She also has a diastases recti. Her hernias are getting larger and causing some pain.   She also has a hiatal hernia and a Bochdalek hernia. She has reflux symptoms that are completely controlled by omeprazole. She has never had evaluation of this by GI. She is not very symptomatic from that.  CT scan also shows small bilateral inguinal hernias containing fat only, As well as the 2 hernias in her anterior abdominal wall.She has no symptoms in her groins, And her physical exam does not reveal inguinal hernia.  She has Cushing's syndrome and is on prednisone Due to pulmonary disease.Marland Kitchen She was hospitalized in January with bilateral pneumonia. She is being treated for aspergillosis. She had a cardiac workup in June by [facility] and that was negative. She has asthma. She has depression. I have counseled her in the office on 3 occasions, and she is now brought to the operating room electively   Operative Findings:       The patient had 2 midline hernias, one at the umbilicus that was probably 3 cm in size, and one about 5 cm above the umbilicus it was about 4 or 5 cm in size. There was some fat caught in these but no omentum. This was debrided. The falciform ligament had to be taken down to get a good repair. Ultimately I repaired the hernias with a 20 cm vertically by 15 cm transversely Parietex composite mesh.  Procedure in Detail:          Following the induction of general  endotracheal anesthesia the patient had a Foley catheter inserted, the abdomen was prepped and draped in a sterile fashion, intravenous antibiotics were given, and a surgical time out was performed. 0.5% Marcaine with epinephrine was used as a local infiltration anesthetic. I placed a 5 mm optical trocar in the left subcostal region. Entry was uneventful. Pneumoperitoneum was created. I also placed an 11 mm and another 5 mm trocar in the left side. The abdomen was explored and other than findings described above there were no abnormalities. I debrided the fat out of the hernias with Harmonic Scalpel. I took down the falciform ligament with the harmonic scalpel. This was a large piece of fatty tissue and I placed it in a specimen bag and  Removed it.  Hemostasis was excellent. I marked the hernia defects superiorly inferiorly and transversely with a spinal needle at the umbilicus And I found that I could get a 5-6 cm overlap in all directions by using a 15 cm x 20 cm piece of  mesh. The parietex composite mesh measuring 15 cm transversely by 20 cm vertically was brought to the operative field. I placed 8 fixation sutures of #1 Novofil circumferentially around the margins of the mesh. I marked a template on the abdominal wall to orient the mesh. The mesh was moistened, rolled up and inserted the abdominal cavity. The mesh was positioned. Great care was taken to make sure that the smooth side of the mesh was toward the viscera and the rough side of the mesh toward the parietal peritoneum. Small stab incisions were made at the 8 suture fixation sites and using an Endo Close device I pulled the sutures through the abdominal wall being careful to take a 1 cm bites of tissue. After all these were placed I released the pneumoperitoneum and tied all the sutures. I then reinflated and found with mesh positioning and fixation was good. I further secured the mesh with the 5 mm pro-tacker in a double crown technique.I removed 2  of the 5 mm trocars to the right side to get better visualization. Approximately 60 of the tacks were placed. Great care was taken to check that there were no gaps at the edge of the mesh. This looked very good. There was no bleeding. Trocars were removed. The pneumoperitoneum was released. All trocar sites were closed with subcuticular sutures of 4-0 Monocryl and Dermabond. Dr. Lajoyce Corners was placed. Patient was taken to recovery in stable condition. EBL 20 cc. Counts correct. Complications none.     Angelia Mould. Derrell Lolling, M.D., FACS General and Minimally Invasive Surgery Breast and Colorectal Surgery  04/13/2012 9:23 AM

## 2012-04-13 NOTE — Preoperative (Signed)
Beta Blockers   Reason not to administer Beta Blockers:Not Applicable 

## 2012-04-14 LAB — BASIC METABOLIC PANEL
Chloride: 104 mEq/L (ref 96–112)
GFR calc Af Amer: >90 mL/min (ref >90)
Potassium: 3.5 mEq/L (ref 3.5–5.1)

## 2012-04-14 LAB — CBC
HCT: 38.2 % (ref 36.0–46.0)
Hemoglobin: 12.5 g/dL (ref 12.0–15.0)
RBC: 4.11 MIL/uL (ref 3.87–5.11)
WBC: 8.9 10*3/uL (ref 4.0–10.5)

## 2012-04-14 NOTE — Progress Notes (Signed)
1 Day Post-Op   Assessment: s/p Procedure(s): LAPAROSCOPIC VENTRAL HERNIA Patient Active Problem List  Diagnosis  . Cushing's syndrome  . HYPERLIPIDEMIA  . Obesity, unspecified  . DEPRESSION  . ASTHMA, PERSISTENT, SEVERE  . ASTHMA UNSPECIFIED WITH EXACERBATION  . A B P A-ALLERGIC BRONCHOPULMONARY ASPERGILLOSIS  . Esophageal reflux  . CELLULITIS  . OSTEOARTHRITIS  . CHONDROMALACIA OF PATELLA  . HIP PAIN, LEFT  . KNEE PAIN, RIGHT  . OSTEOPENIA  . ARM NUMBNESS  . HEADACHE  . PALPITATIONS  . STRESS INCONTINENCE  . ABDOMINAL PAIN OTHER SPECIFIED SITE  . TRANSAMINASES, SERUM, ELEVATED  . ELEVATED BP READING WITHOUT DX HYPERTENSION  . NECK SPRAIN AND STRAIN  . UMBILICAL HERNIA  . DIVERTICULOSIS, COLON  . HEPATIC CYST  . SCOLIOSIS  . RECTAL BLEEDING  . Hives  . OSA (obstructive sleep apnea)  . Environmental allergies  . Chronic cough  . Chest pain, atypical  . Pneumonia  . Fatigue  . Seasonal and perennial allergic rhinitis  . Chest pain  . Ventral hernia    Stable one day post op No apparent respiratory issues  Plan: d/c foley Advance diet Will keep today to be sure pain well controlled and diet tolerated  Subjective: Feels very sore, no dyspnea, tolerated liquids, up in chair  Objective: Vital signs in last 24 hours: Temp:  [97.6 F (36.4 C)-98.6 F (37 C)] 98.6 F (37 C) (10/26 0750) Pulse Rate:  [75-96] 84  (10/26 0450) Resp:  [7-27] 12  (10/26 0450) BP: (95-148)/(59-87) 109/69 mmHg (10/26 0450) SpO2:  [90 %-100 %] 100 % (10/26 0750) Weight:  [174 lb 9.7 oz (79.2 kg)] 174 lb 9.7 oz (79.2 kg) (10/25 1304)   Intake/Output from previous day: 10/25 0701 - 10/26 0700 In: 3158.3 [P.O.:240; I.V.:2618.3; IV Piggyback:150] Out: 950 [Urine:875; Blood:75] Intake/Output this shift:     General appearance: no distress Resp: clear to auscultation bilaterally Cardio: regular rate and rhythm, S1, S2 normal, no murmur, click, rub or gallop GI: Soft, mild  diffuse tender c/w surgery  Incision: Dressing dry  Lab Results:   [facility] 04/14/12 0620  WBC 8.9  HGB 12.5  HCT 38.2  PLT 276   BMET  Basename 04/14/12 0620  NA 138  K 3.5  CL 104  CO2 26  GLUCOSE 105*  BUN 9  CREATININE 0.56  CALCIUM 8.6   PT/INR No results found for this basename: LABPROT:2,INR:2 in the last 72 hours ABG  Basename 04/13/12 1201  PHART 7.290*  HCO3 26.5*    MEDS, Scheduled    .  ceFAZolin (ANCEF) IV  2 g Intravenous Q8H  . fluticasone  2 puff Inhalation BID  . heparin  5,000 Units Subcutaneous Q8H  . hydrocortisone sod succinate (SOLU-CORTEF) injection  50 mg Intravenous Once  . HYDROmorphone      . itraconazole  100 mg Oral BID  . montelukast  10 mg Oral BID  . pantoprazole  40 mg Oral Daily  . predniSONE  5 mg Oral Q breakfast  . rosuvastatin  20 mg Oral Q M,W,F-1800  . tiotropium  18 mcg Inhalation Daily  . venlafaxine XR  112.5 mg Oral Daily  . DISCONTD: albuterol  2.5 mg Nebulization Q6H  . DISCONTD: atorvastatin  10 mg Oral q1800  . DISCONTD: chlorhexidine  1 application Topical Once  . DISCONTD: hydrocortisone sodium succinate  50 mg Intravenous Once  . DISCONTD: itraconazole  100 mg Oral BID    Studies/Results: No results found.  LOS: 1 day     Currie Paris, MD, [facility], [place] [phone]   04/14/2012 7:58 AM

## 2012-04-15 MED ORDER — HYDROCODONE-ACETAMINOPHEN 5-325 MG PO TABS: 1.0000 | ORAL_TABLET | ORAL | Status: DC | PRN

## 2012-04-15 MED ORDER — POLYETHYLENE GLYCOL 3350 17 G PO PACK: 17.0000 g | PACK | Freq: Every day | ORAL | Status: DC | PRN

## 2012-04-15 NOTE — Discharge Summary (Signed)
Physician Discharge Summary  Patient ID: Victoria [name] MRN: [id number] DOB/AGE: [birth year] 56 y.o.  Admit date: 04/13/2012 Discharge date: 04/15/2012  Admission Diagnoses: Multiple ventral hernias  Discharge Diagnoses:  Principal Problem:  *Ventral hernia   Discharged Condition: stable  Hospital Course: Victoria [name] is a 56 y.o. female. Who was seen and evaluated by Dr. Derrell Lolling for eleective repair of her multiple ventral hernias. She has a hx of a ventral hernia about 5 cm above the umbilicus containing fat only. It is starting to cause some more pain.  CT scan shows may be a little bit larger but contains fat only. Also shows small bilateral inguinal hernias containing fat only. No groin pain or discomfort. She also has a hiatal hernia and a Bochdalek hernia. She has reflux symptoms that are completely controlled by omeprazole.    Medical hx is positive for Cushing's syndrome as she has been on chronic prednisone. Recent hospitalization here in January for bilateral pneumonia. She is currently being treated for aspergillosis. She had a cardiac workup in June by [facility] and that was negative.  She states that pulmonary-wise, she has been stable on her current medicine regimen. After review of clinical picture CT scan results and conversation with Dr. Derrell Lolling the patient underwent elective hernia repair on 04/13/12.  She has done very well post operatively, has remained hemodynamically stable and afebrile. She has tolerated a diet and is ambulating on her own. Pain is well managed. At this time she is stable to be discharged to home/self care and will follow up with Dr. Derrell Lolling at our office as scheduled. She has been given post-op care instructions and has our office number should she have further questions or need to speak with [name].    Consults: None  Significant Diagnostic Studies: labs  Treatments: IV hydration, antibiotics, analgesia and surgery.  Discharge Exam: Blood pressure 106/65,  pulse 94, temperature 98.1 F (36.7 C), temperature source Oral, resp. rate 12, height 5\' 3"  (1.6 m), weight 174 lb 9.7 oz (79.2 kg), SpO2 97.00%. General appearance: alert, cooperative, appears stated age and no distress  Disposition: Home/self care  Discharge Orders    Future Orders Please Complete By Expires   Discharge patient      Comments:   Home/self care f/u with Dr. Derrell Lolling as scheduled       Medication List     As of 04/15/2012  2:12 PM    TAKE these medications         aspirin 81 MG tablet   Take 81 mg by mouth daily.      chlorpheniramine-HYDROcodone 10-8 MG/5ML Lqcr   Commonly known as: TUSSIONEX   Take 5 mLs by mouth at bedtime as needed. For cough      HYDROcodone-acetaminophen 5-325 MG per tablet   Commonly known as: NORCO/VICODIN   Take 1-2 tablets by mouth every 4 (four) hours as needed.      ibuprofen 200 MG tablet   Commonly known as: ADVIL,MOTRIN   Take 400 mg by mouth every 6 (six) hours as needed. For pain      itraconazole 100 MG capsule   Commonly known as: SPORANOX   Take 100 mg by mouth 2 (two) times daily. Has stopped taking for few days due to stomach issues      mometasone 220 MCG/INH inhaler   Commonly known as: ASMANEX   Inhale 2 puffs into the lungs every evening.      montelukast 10 MG tablet   Commonly known as:  SINGULAIR   Take 10 mg by mouth 2 (two) times daily.      OMEGA 3 PO   Take 1 capsule by mouth daily.      Omeprazole-Sodium Bicarbonate 20-1100 MG Caps   Commonly known as: ZEGERID   Take 1 capsule by mouth every morning.      OVER THE COUNTER MEDICATION   Take 4 capsules by mouth daily. [employer] Vitamins  Contains calcium, magnesium, vitamin d, vitamin k, zinc, copper, mananese      polyethylene glycol packet   Commonly known as: MIRALAX / GLYCOLAX   Take 17 g by mouth daily as needed.      predniSONE 1 MG tablet   Commonly known as: DELTASONE   Take 2 mg by mouth daily. Take 2mg  x 1 month then continue with  1mg  daily (until further notice).      PROAIR HFA 108 (90 BASE) MCG/ACT inhaler   Generic drug: albuterol   Inhale 2 puffs into the lungs every 6 (six) hours as needed. For shortness of breath      albuterol (2.5 MG/3ML) 0.083% nebulizer solution   Commonly known as: PROVENTIL   Take 2.5 mg by nebulization every 6 (six) hours as needed. For shortness of breath      rosuvastatin 20 MG tablet   Commonly known as: CRESTOR   Take 20 mg by mouth every Monday, Wednesday, and Friday.      tiotropium 18 MCG inhalation capsule   Commonly known as: SPIRIVA   Place 18 mcg into inhaler and inhale daily.      venlafaxine XR 37.5 MG 24 hr capsule   Commonly known as: EFFEXOR-XR   Take 112.5 mg by mouth daily. Take 3 capsules by mouth daily      Vitamin D 2000 UNITS Caps   Take 2,000 Units by mouth.           Follow-up Information    Follow up with [name], MD. In 2 weeks. (Call our office as needed, or if symptoms worsen)    Contact information:   [address] [place] [address] [phone]          Signed: Blenda Mounts [facility] Pager # [phone]  04/15/2012, 2:12 PM

## 2012-04-15 NOTE — Progress Notes (Signed)
Agree with A&P of JD,PA. She is markedly better than yesterday. If she continues well controlled on oral pain meds and tolerates lunch, she can go home this PM

## 2012-04-15 NOTE — Progress Notes (Signed)
Patient being discharged home per MD order. All discharge instructions were given and patient verbalized understanding. Vital signs WNL and patient A&O.

## 2012-04-15 NOTE — Progress Notes (Signed)
Upon leaving patient asked for 1 vicodin pain pill. Patient was already taken out of elink at this time. Patient took 1 vicodin pain pill at 1502 for pain of 5/10.

## 2012-04-15 NOTE — Progress Notes (Signed)
2 Days Post-Op  Subjective: Sitting up at bedside eating breakfast, states that pain and soreness much better today.  "ready to go home."  Objective: Vital signs in last 24 hours: Temp:  [97.5 F (36.4 C)-99.2 F (37.3 C)] 98.3 F (36.8 C) (10/27 0751) Pulse Rate:  [89-93] 93  (10/27 0407) Resp:  [10-16] 10  (10/27 0407) BP: (112-122)/(65-82) 115/65 mmHg (10/27 0407) SpO2:  [96 %-100 %] 99 % (10/27 0407) Last BM Date: 04/12/12  Intake/Output from previous day: 10/26 0701 - 10/27 0700 In: 320 [P.O.:320] Out: 850 [Urine:850] Intake/Output this shift:    General appearance: alert, cooperative, appears stated age and no distress Chest: CTA bilaterally Cardiac: RRR No M/R/G Abdomen: +BS, Flatus tolerating reg diet, some ecchymosis seen around laparoscopic incision sites but no swelling or drainage. Wearing abdominal Binder when OOB, pain well managed no N/V. Extremities: warm to touch, no edema, + pulses. VSS, afebrile  Lab Results:   [facility] 04/14/12 0620  WBC 8.9  HGB 12.5  HCT 38.2  PLT 276   BMET  Basename 04/14/12 0620  NA 138  K 3.5  CL 104  CO2 26  GLUCOSE 105*  BUN 9  CREATININE 0.56  CALCIUM 8.6   PT/INR No results found for this basename: LABPROT:2,INR:2 in the last 72 hours ABG  Basename 04/13/12 1201  PHART 7.290*  HCO3 26.5*    Studies/Results: No results found.  Anti-infectives: Anti-infectives     Start     Dose/Rate Route Frequency Ordered Stop   04/13/12 2200   itraconazole (SPORANOX) capsule 100 mg  Status:  Discontinued        100 mg Oral 2 times daily 04/13/12 1523 04/13/12 1536   04/13/12 2200   itraconazole (SPORANOX) capsule 100 mg        100 mg Oral 2 times daily 04/13/12 1523     04/13/12 1600   ceFAZolin (ANCEF) IVPB 2 g/50 mL premix        2 g 100 mL/hr over 30 Minutes Intravenous 3 times per day 04/13/12 1523 04/14/12 0640   04/12/12 1416   ceFAZolin (ANCEF) 3 g in dextrose 5 % 50 mL IVPB        3 g 160 mL/hr over  30 Minutes Intravenous 60 min pre-op 04/12/12 1416 04/13/12 0747          Assessment/Plan:  Patient Active Problem List  Diagnosis  . Cushing's syndrome  . HYPERLIPIDEMIA  . Obesity, unspecified  . DEPRESSION  . ASTHMA, PERSISTENT, SEVERE  . ASTHMA UNSPECIFIED WITH EXACERBATION  . A B P A-ALLERGIC BRONCHOPULMONARY ASPERGILLOSIS  . Esophageal reflux  . CELLULITIS  . OSTEOARTHRITIS  . CHONDROMALACIA OF PATELLA  . HIP PAIN, LEFT  . KNEE PAIN, RIGHT  . OSTEOPENIA  . ARM NUMBNESS  . HEADACHE  . PALPITATIONS  . STRESS INCONTINENCE  . ABDOMINAL PAIN OTHER SPECIFIED SITE  . TRANSAMINASES, SERUM, ELEVATED  . ELEVATED BP READING WITHOUT DX HYPERTENSION  . NECK SPRAIN AND STRAIN  . UMBILICAL HERNIA  . DIVERTICULOSIS, COLON  . HEPATIC CYST  . SCOLIOSIS  . RECTAL BLEEDING  . Hives  . OSA (obstructive sleep apnea)  . Environmental allergies  . Chronic cough  . Chest pain, atypical  . Pneumonia  . Fatigue  . Seasonal and perennial allergic rhinitis  . Chest pain  . Ventral hernia   Procedure(s) (LRB) with comments: LAPAROSCOPIC VENTRAL HERNIA (N/A) - laparoscopic repair of incarcerated hernia   Plan:  Doing well, pain well managed,  tolerating diet. Transfer to med surg floor Probable discharge tomorrow if continues to do well.   LOS: 2 days    Golda Acre [facility] Pager # [phone] 04/15/2012

## 2012-04-16 ENCOUNTER — Encounter (HOSPITAL_COMMUNITY): Payer: Self-pay | Admitting: General Surgery

## 2012-04-17 NOTE — Progress Notes (Signed)
Utilization Review Completed.  

## 2012-04-19 ENCOUNTER — Telehealth: Payer: Self-pay | Admitting: Pulmonary Disease

## 2012-04-19 NOTE — Telephone Encounter (Signed)
Noted  

## 2012-04-19 NOTE — Telephone Encounter (Signed)
Pt is calling to let VS know she wishes to continue on cpap. She had hernia surgery recently and was told by several people taking care of her in the hospital that she needs to continue wearing her cpap because she stops breathing at times. She states she will continue to work with Christoper Allegra with any mask issues she is having. I will close this msg but forward to VS as an FYI.

## 2012-04-24 ENCOUNTER — Telehealth: Payer: Self-pay | Admitting: Internal Medicine

## 2012-04-24 ENCOUNTER — Other Ambulatory Visit: Payer: Self-pay | Admitting: Family Medicine

## 2012-04-24 MED ORDER — BENZONATATE 100 MG PO CAPS: ORAL_CAPSULE | ORAL | Status: DC

## 2012-04-24 NOTE — Telephone Encounter (Signed)
I spoke with pt and she stated she had laparoscopic surgery for abdominal hernia 04/13/12. She has a dry nagging cough and would like benzonatate called in for her. She wants to keep as much stress off her "throat" as possible. She denies any SOB, wheezing, chest tx. She has been taking robitussin cough gels and cough syrup w/o relief. Since MR is scheduled for night float will forward to doc of the day. Please advise SN thanks  Allergies  Allergen Reactions  . Beclomethasone Dipropionate Hives and Other (See Comments)     weight gain  . Budesonide-Formoterol Fumarate Hives  . Sulfonamide Derivatives Hives and Rash  . Mometasone Furo-Formoterol Fum Hives and Other (See Comments)    weight gain    Last OV 02/27/12 with MR

## 2012-04-24 NOTE — Telephone Encounter (Signed)
Per SN---ok for the tessalon perles  100 mg  #50  1-2 po tid prn  Cough.  With no refills.  This has been sent to the pharmacy per the pts request.  Nothing further is needed.

## 2012-04-30 ENCOUNTER — Ambulatory Visit (INDEPENDENT_AMBULATORY_CARE_PROVIDER_SITE_OTHER): Payer: Managed Care, Other (non HMO) | Admitting: General Surgery

## 2012-04-30 ENCOUNTER — Encounter (INDEPENDENT_AMBULATORY_CARE_PROVIDER_SITE_OTHER): Payer: Self-pay | Admitting: General Surgery

## 2012-04-30 VITALS — BP 128/66 | HR 70 | Temp 97.8°F | Resp 16 | Ht 63.0 in | Wt 167.2 lb

## 2012-04-30 DIAGNOSIS — K439 Ventral hernia without obstruction or gangrene: Secondary | ICD-10-CM

## 2012-04-30 NOTE — Progress Notes (Signed)
Patient ID: Victoria [name], female   DOB: Sep 27, 1955, 56 y.o.   MRN: [id number] History: This patient underwent laparoscopic repair of multiple ventral hernias with a large sheet of mesh on 04/11/2012. She is doing well. Appetite normal. Bowel function normal. Driving her car. Minimal discomfort. Has noticed a little bit of redness and some of the incisions but no drainage no fever.  Exam: Patient was well. No distress. The hernia repair is intact. The trocar sites are healing well. Minimal inflammatory color change around some of the trocar sites but no infection. No seroma.  Assessment: Multiple ventral incisional hernias, uneventful recovery 3 weeks postop laparoscopic repair with mesh Asthma Anxiety and depression Sleep apnea  Plan: Diet and activities discussed. No sports or heavy lifting until after November 30 Return PRN   [facility]. Derrell Lolling, M.D., [facility] General and Minimally invasive Surgery Breast and Colorectal Surgery Office:   [phone] Pager:   [phone]

## 2012-04-30 NOTE — Patient Instructions (Signed)
Your a laparoscopic ventral hernia repair appears to be healing without any surgical complications.  You may resume all normal physical activities without restriction after November 30.  Return to see Dr. Derrell Lolling if further problems arise.

## 2012-05-01 ENCOUNTER — Other Ambulatory Visit: Payer: Self-pay

## 2012-05-01 NOTE — Telephone Encounter (Signed)
Pt states been taking 4 per day Venlafaxine 37.5 mg. Pt asking to increase dosage to 150 mg tab. Pt states at this dosage is helps the lower dosage wasn't. Pt states after surgery in October been feeling depressed because of the after effects so taking more of this med helps with that.   Plz advise    MW

## 2012-05-02 MED ORDER — VENLAFAXINE HCL ER 150 MG PO TB24: 150.0000 mg | ORAL_TABLET | Freq: Every day | ORAL | Status: DC

## 2012-05-07 ENCOUNTER — Telehealth: Payer: Self-pay | Admitting: *Deleted

## 2012-05-07 NOTE — Telephone Encounter (Signed)
I spoke with Johny Drilling and she has paperwork for [employer] but what she had last documented was that the pt declined xolair. However I see on 02-27-12 OV note the pt agreed to try xolair x 1 year, however a new order was not placed so Johny Drilling was not aware to restart the process. The pt was has never started on xolair. Does she need to do so now? Or wait until follow-up to discuss? Please advise. Carron Curie, CMA

## 2012-05-08 NOTE — Telephone Encounter (Signed)
Start [employer]

## 2012-05-08 NOTE — Telephone Encounter (Signed)
I spoke with the pt and she states that she is doing well and does not want to start xolair at this time. She states she will revisit the issue in the spring.Carron Curie, CMA

## 2012-06-15 ENCOUNTER — Telehealth: Payer: Self-pay | Admitting: Internal Medicine

## 2012-06-15 MED ORDER — MOMETASONE FUROATE 220 MCG/INH IN AEPB: 2.0000 | INHALATION_SPRAY | Freq: Every evening | RESPIRATORY_TRACT | Status: DC

## 2012-06-15 NOTE — Telephone Encounter (Signed)
Spoke with patient, patient out of asmanex and can not wait for supply to come in from mail order.  Requesting one inhaler be sent in to [facility].  Rx sent and nothing further needed at this time.

## 2012-06-22 ENCOUNTER — Other Ambulatory Visit: Payer: Self-pay | Admitting: *Deleted

## 2012-06-22 MED ORDER — MOMETASONE FUROATE 220 MCG/INH IN AEPB: 2.0000 | INHALATION_SPRAY | Freq: Every evening | RESPIRATORY_TRACT | Status: DC

## 2012-07-09 ENCOUNTER — Telehealth: Payer: Self-pay | Admitting: Internal Medicine

## 2012-07-09 MED ORDER — TIOTROPIUM BROMIDE MONOHYDRATE 18 MCG IN CAPS: 18.0000 ug | ORAL_CAPSULE | Freq: Every day | RESPIRATORY_TRACT | Status: DC

## 2012-07-09 NOTE — Telephone Encounter (Signed)
(  continued) to get Rx in time before pt runs out.  Local pharmacy is [facility].  Victoria [name]

## 2012-07-09 NOTE — Telephone Encounter (Signed)
I spoke with pt and she stated Rosann Auerbach is telling her they are needing to speak with [name] before they can fill her spiriva. Would not tell her why. She asked we send her spiriva to [facility]. I have done so. I called Rosann Auerbach adn was closed d/t MLKJ Holiday. wcb tomorrow

## 2012-07-10 MED ORDER — TIOTROPIUM BROMIDE MONOHYDRATE 18 MCG IN CAPS: 18.0000 ug | ORAL_CAPSULE | Freq: Every day | RESPIRATORY_TRACT | Status: DC

## 2012-07-10 NOTE — Telephone Encounter (Signed)
Prescription called to [facility]. Pt aware.

## 2012-07-30 ENCOUNTER — Ambulatory Visit: Payer: Managed Care, Other (non HMO) | Admitting: Family Medicine

## 2012-07-31 ENCOUNTER — Encounter: Payer: Self-pay | Admitting: Family Medicine

## 2012-07-31 ENCOUNTER — Ambulatory Visit (INDEPENDENT_AMBULATORY_CARE_PROVIDER_SITE_OTHER): Payer: Managed Care, Other (non HMO) | Admitting: Family Medicine

## 2012-07-31 VITALS — BP 118/70 | HR 76 | Temp 98.6°F | Wt 167.6 lb

## 2012-07-31 DIAGNOSIS — J321 Chronic frontal sinusitis: Secondary | ICD-10-CM

## 2012-07-31 MED ORDER — AMOXICILLIN-POT CLAVULANATE 875-125 MG PO TABS: 1.0000 | ORAL_TABLET | Freq: Two times a day (BID) | ORAL | Status: DC

## 2012-07-31 MED ORDER — MOMETASONE FUROATE 50 MCG/ACT NA SUSP: 2.0000 | Freq: Every day | NASAL | Status: DC

## 2012-07-31 NOTE — Progress Notes (Signed)
  Subjective:    Patient ID: Victoria [name], female    DOB: 1956-02-22, 58 y.o.   MRN: [id number]  HPI    Review of Systems     Objective:   Physical Exam        Assessment & Plan:   Subjective:     Victoria [name] is a 57 y.o. female who presents for evaluation of sinus pain. Symptoms include: congestion, cough, facial pain, nasal congestion, post nasal drip and sinus pressure. Onset of symptoms was 3 weeks ago. Symptoms have been gradually worsening since that time. Past history is significant for asthma. Patient is a non-smoker.  The following portions of the patient's history were reviewed and updated as appropriate: allergies, current medications, past family history, past medical history, past social history, past surgical history and problem list.  Review of Systems Pertinent items are noted in HPI.   Objective:    BP 118/70  Pulse 76  Temp(Src) 98.6 F (37 C) (Oral)  Wt 167 lb 9.6 oz (76.023 kg)  BMI 29.7 kg/m2  SpO2 96% General appearance: alert, cooperative, appears stated age and mild distress Ears: normal TM's and external ear canals both ears Nose: green discharge, moderate congestion, turbinates red, swollen, grade 1 out of 4, sinus tenderness bilateral Throat: + P ND Neck: mild anterior cervical adenopathy, supple, symmetrical, trachea midline and thyroid not enlarged, symmetric, no tenderness/mass/nodules Lungs: clear to auscultation bilaterally Heart: S1, S2 normal Extremities: extremities normal, atraumatic, no cyanosis or edema    Assessment:    Acute bacterial sinusitis.    Plan:    Nasal saline sprays. Neti pot recommended. Instructions given. Nasal steroids per medication orders. Antihistamines per medication orders. Augmentin per medication orders. f/u prn

## 2012-07-31 NOTE — Patient Instructions (Signed)

## 2012-08-01 ENCOUNTER — Other Ambulatory Visit: Payer: Self-pay | Admitting: Family Medicine

## 2012-08-04 NOTE — Telephone Encounter (Signed)
Please advise on RF request.  Last OV:07-31-12.//AB/CMA

## 2012-09-04 ENCOUNTER — Ambulatory Visit (INDEPENDENT_AMBULATORY_CARE_PROVIDER_SITE_OTHER): Payer: Managed Care, Other (non HMO) | Admitting: Family Medicine

## 2012-09-04 ENCOUNTER — Encounter: Payer: Self-pay | Admitting: Family Medicine

## 2012-09-04 VITALS — BP 120/82 | HR 84 | Temp 98.7°F | Wt 163.0 lb

## 2012-09-04 DIAGNOSIS — J209 Acute bronchitis, unspecified: Secondary | ICD-10-CM

## 2012-09-04 DIAGNOSIS — E785 Hyperlipidemia, unspecified: Secondary | ICD-10-CM

## 2012-09-04 DIAGNOSIS — M25539 Pain in unspecified wrist: Secondary | ICD-10-CM

## 2012-09-04 LAB — LIPID PANEL
Cholesterol: 186 mg/dL (ref 0–200)
LDL Cholesterol: 125 mg/dL — ABNORMAL HIGH (ref 0–99)

## 2012-09-04 LAB — BASIC METABOLIC PANEL
BUN: 12 mg/dL (ref 6–23)
CO2: 27 mEq/L (ref 19–32)
Chloride: 103 mEq/L (ref 96–112)
Glucose, Bld: 100 mg/dL — ABNORMAL HIGH (ref 70–99)
Potassium: 4 mEq/L (ref 3.5–5.1)

## 2012-09-04 LAB — HEPATIC FUNCTION PANEL
ALT: 37 U/L — ABNORMAL HIGH (ref 0–35)
AST: 23 U/L (ref 0–37)
Albumin: 3.9 g/dL (ref 3.5–5.2)
Total Bilirubin: 0.5 mg/dL (ref 0.3–1.2)

## 2012-09-04 MED ORDER — LEVOFLOXACIN 500 MG PO TABS: 500.0000 mg | ORAL_TABLET | Freq: Every day | ORAL | Status: DC

## 2012-09-04 MED ORDER — PREDNISONE 10 MG PO TABS: ORAL_TABLET | ORAL | Status: DC

## 2012-09-04 MED ORDER — HYDROCOD POLST-CHLORPHEN POLST 10-8 MG/5ML PO LQCR: 5.0000 mL | Freq: Every evening | ORAL | Status: DC | PRN

## 2012-09-04 NOTE — Progress Notes (Signed)
  Subjective:     Victoria [name] is a 57 y.o. female here for evaluation of a cough. Onset of symptoms was several weeks ago. Symptoms have been unchanged since that time. The cough is productive and is aggravated by cold air, dust, fumes, infection, pollens and reclining position. Associated symptoms include: postnasal drip, shortness of breath, sputum production and wheezing. Patient does have a history of asthma. Patient does have a history of environmental allergens. Patient has not traveled recently. Patient does not have a history of smoking. Patient has had a previous chest x-ray. Patient has not had a PPD done.  The following portions of the patient's history were reviewed and updated as appropriate: allergies, current medications, past family history, past medical history, past social history, past surgical history and problem list.  Review of Systems Pertinent items are noted in HPI.    Objective:    Oxygen saturation 96% on room air BP 120/82  Pulse 84  Temp(Src) 98.7 F (37.1 C) (Oral)  Wt 163 lb (73.936 kg)  BMI 28.88 kg/m2  SpO2 96% General appearance: alert, cooperative, appears stated age and no distress Ears: normal TM's and external ear canals both ears Nose: Nares normal. Septum midline. Mucosa normal. No drainage or sinus tenderness. Throat: lips, mucosa, and tongue normal; teeth and gums normal Neck: no adenopathy, supple, symmetrical, trachea midline and thyroid not enlarged, symmetric, no tenderness/mass/nodules Lungs: diminished breath sounds bilaterally and wheezes bilaterally    Assessment:    Acute Bronchitis and Asthma    Plan:    Antibiotics per medication orders. Antitussives per medication orders. Avoid exposure to tobacco smoke and fumes. B-agonist inhaler. Call if shortness of breath worsens, blood in sputum, change in character of cough, development of fever or chills, inability to maintain nutrition and hydration. Avoid exposure to tobacco smoke and  fumes. Steroid inhaler as ordered. pred taper,  rto prn

## 2012-09-04 NOTE — Patient Instructions (Addendum)

## 2012-09-06 ENCOUNTER — Telehealth: Payer: Self-pay | Admitting: Family Medicine

## 2012-09-06 MED ORDER — ROSUVASTATIN CALCIUM 20 MG PO TABS: 20.0000 mg | ORAL_TABLET | ORAL | Status: DC

## 2012-09-06 NOTE — Telephone Encounter (Signed)
REFILL ON CRESTOR TAB 20 MG -B-

## 2012-09-06 NOTE — Telephone Encounter (Signed)
Refill done.  

## 2012-09-10 ENCOUNTER — Encounter: Payer: Self-pay | Admitting: Family Medicine

## 2012-09-10 ENCOUNTER — Telehealth: Payer: Self-pay | Admitting: Family Medicine

## 2012-09-10 MED ORDER — ROSUVASTATIN CALCIUM 20 MG PO TABS: 20.0000 mg | ORAL_TABLET | ORAL | Status: DC

## 2012-09-10 NOTE — Telephone Encounter (Signed)
Refill- crestor 20mg . Qty 90 day supply

## 2012-09-10 NOTE — Telephone Encounter (Signed)
Rx sent 

## 2012-09-21 ENCOUNTER — Telehealth: Payer: Self-pay | Admitting: Pulmonary Disease

## 2012-09-21 DIAGNOSIS — G4733 Obstructive sleep apnea (adult) (pediatric): Secondary | ICD-10-CM

## 2012-09-21 NOTE — Telephone Encounter (Signed)
Called and spoke with pt and she stated that they took the modem for her cpap.  She stated that she has not been using it like she should.  She has had sinus issues and has had to have 2 abx to clear this up.  She stated that she didn't know if VS had any recs for her at this time ---she did schedule appt with VS on 10/26/2012.  She stated that if she has to keep using the cpap she would like to get fitted for mouth piece.  She stated that she is not able to hear her son if he wakes up at night with the regular mask.  VS please advise. thanks

## 2012-09-24 NOTE — Telephone Encounter (Signed)
I spoke with pt and she is aware of VS recs. She stated she was talking about oral appliance. But stated she will discuss this with him at that OV. Will forward to VS as an [place]

## 2012-09-24 NOTE — Telephone Encounter (Signed)
Please advised pt that I have sent order to her DME to arrange for CPAP mask refit.

## 2012-09-26 NOTE — Addendum Note (Signed)
Addended by: Coralyn Helling on: 09/26/2012 09:53 AM   Modules accepted: Orders

## 2012-09-26 NOTE — Telephone Encounter (Signed)
Please inform pt that I have sent order to arrange for referral to Dr. Althea Grimmer to assess for oral appliance to treat her obstructive sleep apnea.  She can then have her ROV rescheduled to 6 months from now.

## 2012-09-26 NOTE — Telephone Encounter (Signed)
i spoke with pt. She still wants to keep her appt with VS and come in. Nothing further was needed

## 2012-10-03 ENCOUNTER — Ambulatory Visit (INDEPENDENT_AMBULATORY_CARE_PROVIDER_SITE_OTHER)
Admission: RE | Admit: 2012-10-03 | Discharge: 2012-10-03 | Disposition: A | Discharge status: Home / Self Care | Payer: Managed Care, Other (non HMO) | Source: Ambulatory Visit | Attending: Internal Medicine | Admitting: Internal Medicine

## 2012-10-03 ENCOUNTER — Ambulatory Visit (INDEPENDENT_AMBULATORY_CARE_PROVIDER_SITE_OTHER): Payer: Managed Care, Other (non HMO) | Admitting: Internal Medicine

## 2012-10-03 ENCOUNTER — Encounter: Payer: Self-pay | Admitting: Internal Medicine

## 2012-10-03 VITALS — BP 120/84 | HR 84 | Ht 62.5 in | Wt 165.8 lb

## 2012-10-03 DIAGNOSIS — J209 Acute bronchitis, unspecified: Secondary | ICD-10-CM

## 2012-10-03 DIAGNOSIS — R05 Cough: Secondary | ICD-10-CM

## 2012-10-03 DIAGNOSIS — R059 Cough, unspecified: Secondary | ICD-10-CM

## 2012-10-03 DIAGNOSIS — K219 Gastro-esophageal reflux disease without esophagitis: Secondary | ICD-10-CM

## 2012-10-03 MED ORDER — ROFLUMILAST 500 MCG PO TABS: 500.0000 ug | ORAL_TABLET | Freq: Every day | ORAL | Status: DC

## 2012-10-03 MED ORDER — AZITHROMYCIN 250 MG PO TABS: ORAL_TABLET | ORAL | Status: DC

## 2012-10-03 MED ORDER — LEVALBUTEROL HCL 0.63 MG/3ML IN NEBU
0.6300 mg | INHALATION_SOLUTION | Freq: Once | RESPIRATORY_TRACT | Status: AC
  Administered 2012-10-03: 0.63 mg via RESPIRATORY_TRACT

## 2012-10-03 MED ORDER — PREDNISONE 10 MG PO TABS: ORAL_TABLET | ORAL | Status: DC

## 2012-10-03 MED ORDER — ROFLUMILAST 500 MCG PO TABS: ORAL_TABLET | ORAL | Status: DC

## 2012-10-03 MED ORDER — METHYLPREDNISOLONE ACETATE 80 MG/ML IJ SUSP
80.0000 mg | Freq: Once | INTRAMUSCULAR | Status: AC
  Administered 2012-10-03: 80 mg via INTRAMUSCULAR

## 2012-10-03 NOTE — Progress Notes (Signed)
10/03/12- 56 yoF never smoker followed by Dr Marchelle Gearing for severe persistent asthma/ bronchiectasis ACUTE VISIT: MR patient; chest tightness/congestion that has increased; has been using neb tx's and started 40 mg Pred yesterday and today so far. Went to PCP  back in Feb and March for similiar problems. If there were he had sinusitis treated by her primary physician with amoxicillin prednisone and Nasonex. In March she had more postnasal drip, feeling she had never really cleared from the first round. Treated with Levaquin which he took for 7 of 10 days before stopping because of GI upset along with 9 days of prednisone. The past week she has needed more use of her rescue inhaler and started guaifenesin. Cough productive yellow brown plugs. Feels tired but denies fever or chills. She started a gluten-free diet in September and thinks that may help her asthma. Not using CPAP now because of active cough. Did not pursue Xolair therapy, hoping gluten-free diet would take care of her. Started prednisone 40 mg yesterday and today. He is usually on maintenance prednisone 2 mg daily. CT chest 6/25 /13 IMPRESSION:  1. Acute inflammatory disease in the left upper lobe superimposed  on extensive bilateral bronchiectasis. Moderate to maintain follow-  up CT scan without contrast in 2 months after treatment to ensure  resolution.  2. Chronic inflammatory changes in the right lung including small  nodules that are stable.  3. Large hiatal hernia and left posterior medial diaphragmatic  hernia.  Original Report Authenticated By: Gwynn Burly, M.D.  ROS-see HPI Constitutional:   No-   weight loss, night sweats, fevers, chills, fatigue, lassitude. HEENT:   No-  headaches, difficulty swallowing, tooth/dental problems, sore throat,       No-  sneezing, itching, ear ache, nasal congestion, post nasal drip,  CV:  No-   chest pain, orthopnea, PND, swelling in lower extremities, anasarca,                                   dizziness, palpitations Resp:+shortness of breath with exertion or at rest.              +productive cough,  No non-productive cough,  No- coughing up of blood.              + change in color of mucus.  + wheezing.   Skin: No-   rash or lesions. GI:  No-   heartburn, indigestion, abdominal pain, nausea, vomiting, GU:  MS:  No-   joint pain or swelling.   Neuro-     nothing unusual Psych:  No- change in mood or affect. No depression or anxiety.  No memory loss.   OBJ- Physical Exam General- Alert, Oriented, Affect-appropriate, Distress- none acute Skin- rash-none, lesions- none, excoriation- none Lymphadenopathy- none Head- atraumatic            Eyes- Gross vision intact, PERRLA, conjunctivae and secretions clear            Ears- Hearing, canals-normal            Nose- Clear, no-Septal dev, mucus, polyps, erosion, perforation             Throat- Mallampati II , mucosa clear , drainage- none, tonsils- atrophic Neck- flexible , trachea midline, no stridor , thyroid nl, carotid no bruit Chest - symmetrical excursion , unlabored           Heart/CV- RRR , no  murmur , no gallop  , no rub, nl s1 s2                           - JVD- none , edema- none, stasis changes- none, varices- none           Lung- +wet squeaks and wheezes, + harsh cough, unlabored , dullness-none, rub- none           Chest wall-  Abd-  Br/ Gen/ Rectal- Not done, not indicated Extrem- cyanosis- none, clubbing, none, atrophy- none, strength- nl Neuro- grossly intact to observation

## 2012-10-03 NOTE — Patient Instructions (Addendum)
Neb xop 0.63  Depo 80  Script sent for prednisone taper  Script sent for Z pak  Sample and script for Daliresp airway anti-inflammatory    Start slowly- 1/2 pill every other day with food.  After a week or two, if your stomach is ok with it, try moving up to 1 tab daily with food.   Order CXR  Dx acute exacerbation of bronchiectasis

## 2012-10-08 ENCOUNTER — Encounter: Payer: Self-pay | Admitting: Internal Medicine

## 2012-10-08 ENCOUNTER — Telehealth: Payer: Self-pay | Admitting: Internal Medicine

## 2012-10-08 MED ORDER — IPRATROPIUM-ALBUTEROL 0.5-2.5 (3) MG/3ML IN SOLN: RESPIRATORY_TRACT | Status: DC

## 2012-10-08 NOTE — Telephone Encounter (Signed)
 -----   Message from [name] to Kalman Shan, MD sent at 10/08/2012 12:40 PM -----    Cough has quieted down BUT still feel tight, & SOB w exertion. I feel I should have turned corner by now.   Per discussion at appt, w/Dr. Maple Hudson could you please call in RX to [facility] for generic DuoNeb. I think it works better to open up airways and bring up phlegm. Not getting that w/albuterol only breathing treatments. Do you think I may need another cortisone injection?        ANSWER FROM MR   - send generic duoneb to use 4-6times daily as needed  - if gettting worse with this then will consider steroids - if really bad, should go to ER  Dr. Kalman Shan, M.D., [facility].C.P Pulmonary and Critical Care Medicine Staff Physician [facility] [place] [facility] Pager: [phone], If no answer or between  15:00h - 7:00h: call [phone]  10/08/2012 4:02 PM

## 2012-10-08 NOTE — Telephone Encounter (Signed)
Called and LMTCB to pt

## 2012-10-08 NOTE — Telephone Encounter (Signed)
Pt returned call and can be reached @ [phone]. Victoria [name]

## 2012-10-08 NOTE — Telephone Encounter (Signed)
Spoke with pt She states that she is feeling some better since acute ov with CDY 10/03/12 She states that her cough is gone, but she is still feeling tight in her chest and having some SOB with minimal exertion She states that her PCP refilled albuterol nebs for her a while back, but she feels duoneb works better for her and wants rx for this She has finished zpack but still has 5 days left on pred taper Please advise thanks! Allergies  Allergen Reactions  . Beclomethasone Dipropionate Hives and Other (See Comments)     weight gain  . Budesonide-Formoterol Fumarate Hives  . Sulfonamide Derivatives Hives and Rash  . Mometasone Furo-Formoterol Fum Hives and Other (See Comments)    weight gain

## 2012-10-08 NOTE — Telephone Encounter (Signed)
Pt is aware of MR recommendations. She agreed and verbalized understanding.  Rx has been sent to her pharmacy.

## 2012-10-09 MED ORDER — IPRATROPIUM-ALBUTEROL 0.5-2.5 (3) MG/3ML IN SOLN: RESPIRATORY_TRACT | Status: DC

## 2012-10-10 NOTE — Assessment & Plan Note (Signed)
Plan-treat now as an acute exacerbation of bronchitis complicated by probable recurrent aspiration. Plan-chest x-ray, prednisone a day taper from 40 mg, nebulizer treatment  Xopenex with Depo-Medrol.Z-Pak, Daliresp with medication talk.

## 2012-10-10 NOTE — Assessment & Plan Note (Signed)
.   We emphasized reflux precautions.Reflux and recurrent aspiration probably caused her bronchiectasis

## 2012-10-18 ENCOUNTER — Telehealth: Payer: Self-pay

## 2012-10-18 MED ORDER — VENLAFAXINE HCL ER 150 MG PO CP24: 150.0000 mg | ORAL_CAPSULE | Freq: Every day | ORAL | Status: DC

## 2012-10-18 NOTE — Telephone Encounter (Signed)
Spoke with patient about the drug recall and she said her medication was not apart of the recall. She is requesting a 90 day supply be sent to [facility]. Rx sent    KP

## 2012-10-18 NOTE — Telephone Encounter (Signed)
Message copied by Arnette Norris on Thu Oct 18, 2012  4:40 PM ------      Message from: Greer, [place]      Created: Thu Oct 18, 2012  4:14 PM      Regarding: returned call       Pt called to return your call pls call her at home [phone] ------

## 2012-10-26 ENCOUNTER — Ambulatory Visit (INDEPENDENT_AMBULATORY_CARE_PROVIDER_SITE_OTHER): Payer: Managed Care, Other (non HMO) | Admitting: Pulmonary Disease

## 2012-10-26 ENCOUNTER — Encounter: Payer: Self-pay | Admitting: Pulmonary Disease

## 2012-10-26 VITALS — BP 110/64 | HR 90 | Temp 97.8°F | Ht 62.5 in | Wt 162.6 lb

## 2012-10-26 DIAGNOSIS — G4733 Obstructive sleep apnea (adult) (pediatric): Secondary | ICD-10-CM

## 2012-10-26 DIAGNOSIS — F518 Other sleep disorders not due to a substance or known physiological condition: Secondary | ICD-10-CM | POA: Insufficient documentation

## 2012-10-26 NOTE — Assessment & Plan Note (Signed)
Explained that this is a benign phenomena, and no interventions are needed.

## 2012-10-26 NOTE — Progress Notes (Signed)
Chief Complaint  Patient presents with  . Sleep Apnea    Pt states she has not worn CPAP since Feb. She stated she got bronchitis and was coughing all the time.     History of Present Illness: Victoria [name] is a 57 y.o. female with mild OSA.  She has not been able to use CPAP.  She has never felt like this helped.  She has not been seen by dentist yet to get oral appliance.  She has lost about 10 lbs since last year.  Her main issues with sleep are related to her restless legs, and having to wake up at night to take care of her child.  She will get jerking sensation as she falls asleep, but not after she is asleep.  She is not sure if she snores still.  She is not waking up with a choking sensation.  TESTS: PSG 03/15/06 >> AHI 32.6, SpO2 low 85% PSG 02/20/12 >> AHI 5.3, SpO2 low 91%, PLMI 42.3. Minimal supine sleep (had positional effect).   Victoria [name]  has a past medical history of Asthma; Obesity; Maxillary sinusitis; Osteoarthritis; IBS (irritable bowel syndrome); Hyperlipidemia; COPD (chronic obstructive pulmonary disease); Pulmonary nodules; Osteoporosis; Nasal congestion (9/12); Cough; Wheezing; GERD (gastroesophageal reflux disease); H/O hiatal hernia; Depression; Allergic bronchopulmonary aspergillosis (2008); Anemia; Normal cardiac stress test (11/2011); OSA (obstructive sleep apnea) (02/2012); Anginal pain; Chronic bronchitis; Exertional dyspnea; Headache; Migraines; Anxiety; and Pneumonia (11/2011).  Victoria [name]  has past surgical history that includes Cesarean section (1985); Hernia repair (04/13/2012); Appendectomy (1989); and Ventral hernia repair (04/13/2012).  Prior to Admission medications   Medication Sig Start Date End Date Taking? Authorizing Provider  albuterol (PROAIR HFA) 108 (90 BASE) MCG/ACT inhaler Inhale 2 puffs into the lungs every 6 (six) hours as needed. For shortness of breath   Yes Historical Provider, MD  aspirin 81 MG tablet Take 81 mg by mouth daily.    Yes  Historical Provider, MD  benzonatate (TESSALON) 100 MG capsule Take 1-2 tablets by mouth three times daily as needed for cough 04/24/12  Yes Michele Mcalpine, MD  chlorpheniramine-HYDROcodone (TUSSIONEX) 10-8 MG/5ML LQCR Take 5 mLs by mouth at bedtime as needed. For cough 09/04/12  Yes Lelon Perla, DO  Cholecalciferol (VITAMIN D) 2000 UNITS CAPS Take 2,000 Units by mouth.     Yes Historical Provider, MD  ibuprofen (ADVIL,MOTRIN) 200 MG tablet Take 400 mg by mouth every 6 (six) hours as needed. For pain   Yes Historical Provider, MD  ipratropium-albuterol (DUONEB) 0.5-2.5 (3) MG/3ML SOLN Take 3mL up to 4-6 times per day 10/09/12  Yes Kalman Shan, MD  mometasone [facility]) 220 MCG/INH inhaler Inhale 2 puffs into the lungs every evening. 06/22/12  Yes Kalman Shan, MD  montelukast (SINGULAIR) 10 MG tablet Take 10 mg by mouth 2 (two) times daily. 03/02/12 04/14/14 Yes Kalman Shan, MD  Omega-3 Fatty Acids (OMEGA 3 PO) Take 1 capsule by mouth daily.    Yes Historical Provider, MD  Omeprazole-Sodium Bicarbonate (ZEGERID) 20-1100 MG CAPS Take 1 capsule by mouth every morning. 03/02/12 03/02/13 Yes Kalman Shan, MD  OVER THE COUNTER MEDICATION Take 4 capsules by mouth daily. [facility] Contains calcium, magnesium, vitamin d, vitamin k, zinc, copper, mananese   Yes Historical Provider, MD  predniSONE (DELTASONE) 1 MG tablet Take 2 mg by mouth daily. Take 2mg  x 1 month then continue with 1mg  daily (until further notice). 02/27/12  Yes Kalman Shan, MD  roflumilast (DALIRESP) 500 MCG TABS tablet Take 1  tablet (500 mcg total) by mouth daily. After a meal 10/03/12  Yes Waymon Budge, MD  rosuvastatin (CRESTOR) 20 MG tablet Take 1 tablet (20 mg total) by mouth every Monday, Wednesday, and Friday. 09/10/12 09/10/13 Yes Yvonne R Lowne, DO  tiotropium (SPIRIVA) 18 MCG inhalation capsule Place 1 capsule (18 mcg total) into inhaler and inhale daily. 07/10/12  Yes Kalman Shan, MD  venlafaxine XR  (EFFEXOR-XR) 150 MG 24 hr capsule Take 1 capsule (150 mg total) by mouth daily. 10/18/12  Yes Lelon Perla, DO    Allergies  Allergen Reactions  . Beclomethasone Dipropionate Hives and Other (See Comments)     weight gain  . Budesonide-Formoterol Fumarate Hives  . Sulfonamide Derivatives Hives and Rash  . Mometasone Furo-Formoterol Fum Hives and Other (See Comments)    weight gain     Physical Exam:  General - No distress ENT - No sinus tenderness, MP 3, no oral exudate, no LAN Cardiac - s1s2 regular, no murmur Chest - No wheeze/rales/dullness Back - No focal tenderness Abd - Soft, non-tender Ext - No edema Neuro - Normal strength Skin - No rashes Psych - normal mood, and behavior   Assessment/Plan:  Coralyn Helling, MD [place] [facility]/Critical Care/Sleep Pager:  [phone]

## 2012-10-26 NOTE — Patient Instructions (Signed)
Call if you are having more trouble with your sleep and want to try an oral appliance.

## 2012-10-26 NOTE — Assessment & Plan Note (Signed)
Her most recent sleep study from September 2013 showed very mild sleep apnea.  Her improvement is likely related to weight loss.  She has not been able to tolerate CPAP.  I have advised her to monitor her sleep pattern, and call back if she thinks she would like to try oral appliance.

## 2012-10-30 ENCOUNTER — Telehealth: Payer: Self-pay | Admitting: Pulmonary Disease

## 2012-10-30 DIAGNOSIS — G4733 Obstructive sleep apnea (adult) (pediatric): Secondary | ICD-10-CM

## 2012-10-30 NOTE — Telephone Encounter (Signed)
Spoke with patient, she states at her last OV 10/26/12 she and Dr. Craige Cotta discussed d/c-ing her CPAP I do not see this in OV note, Patient requesting order sent to [facility] so she can have machine picked up. Dr. Craige Cotta please advise, thank you!

## 2012-10-30 NOTE — Telephone Encounter (Signed)
Pt aware order has been sent

## 2012-10-30 NOTE — Telephone Encounter (Signed)
Okay to send order to have CPAP equipment removed.

## 2012-11-02 ENCOUNTER — Ambulatory Visit (INDEPENDENT_AMBULATORY_CARE_PROVIDER_SITE_OTHER): Payer: Managed Care, Other (non HMO) | Admitting: Family Medicine

## 2012-11-02 ENCOUNTER — Encounter: Payer: Self-pay | Admitting: Family Medicine

## 2012-11-02 VITALS — BP 110/89 | HR 86 | Temp 98.5°F | Ht 62.75 in | Wt 164.0 lb

## 2012-11-02 DIAGNOSIS — R109 Unspecified abdominal pain: Secondary | ICD-10-CM

## 2012-11-02 LAB — CBC WITH DIFFERENTIAL/PLATELET
Basophils Absolute: 0 10*3/uL (ref 0.0–0.1)
Eosinophils Absolute: 0.5 10*3/uL (ref 0.0–0.7)
HCT: 37.7 % (ref 36.0–46.0)
Hemoglobin: 12.7 g/dL (ref 12.0–15.0)
Lymphs Abs: 1.7 10*3/uL (ref 0.7–4.0)
MCHC: 33.7 g/dL (ref 30.0–36.0)
MCV: 89.2 fl (ref 78.0–100.0)
Neutro Abs: 6.4 10*3/uL (ref 1.4–7.7)
RDW: 14.5 % (ref 11.5–14.6)

## 2012-11-02 LAB — POCT URINALYSIS DIPSTICK
Bilirubin, UA: NEGATIVE
Blood, UA: NEGATIVE
Ketones, UA: NEGATIVE
pH, UA: 6

## 2012-11-02 LAB — BASIC METABOLIC PANEL
CO2: 29 mEq/L (ref 19–32)
Calcium: 8.9 mg/dL (ref 8.4–10.5)
Creatinine, Ser: 0.7 mg/dL (ref 0.4–1.2)

## 2012-11-02 LAB — LIPASE: Lipase: 21 U/L (ref 11.0–59.0)

## 2012-11-02 LAB — HEPATIC FUNCTION PANEL
Bilirubin, Direct: 0 mg/dL (ref 0.0–0.3)
Total Bilirubin: 0.3 mg/dL (ref 0.3–1.2)
Total Protein: 6.5 g/dL (ref 6.0–8.3)

## 2012-11-02 NOTE — Progress Notes (Signed)
  Subjective:    Patient ID: Victoria [name], female    DOB: Nov 02, 1955, 57 y.o.   MRN: [id number]  HPI abd pain- pt thought she overdid it gardening.  Started w/ LUQ pain around old incision site 3 weeks ago.  Wednesday radiated into RLQ and towards umbilicus.  Took advil and pain improved.  Yesterday had pain w/ taking steps.  + abdominal cramping, loose stools.  Pt reports she has been off and on abx since Feb.  Had normal BM this AM.  No fevers.  No N/V.  No dysuria, frequency, urgency.  + diverticulosis on colonoscopy.  No change in pain w/ eating.  Taking probiotic daily, drank 3 oz of aloe vera.  Today reports she is nearly pain free after taking ibuprofen.   Review of Systems For ROS see HPI     Objective:   Physical Exam  Vitals reviewed. Constitutional: She is oriented to person, place, and time. She appears well-developed and well-nourished. No distress.  HENT:  Head: Normocephalic and atraumatic.  Cardiovascular: Normal rate, regular rhythm, normal heart sounds and intact distal pulses.   No murmur heard. Pulmonary/Chest: Effort normal and breath sounds normal. No respiratory distress. She has no wheezes. She has no rales.  Abdominal: Soft. She exhibits no distension and no mass. There is tenderness (over LUQ and LLQ). There is no rebound and no guarding.  Musculoskeletal: She exhibits no edema.  Lymphadenopathy:    She has no cervical adenopathy.  Neurological: She is alert and oriented to person, place, and time.  Skin: Skin is warm and dry.  Psychiatric: She has a normal mood and affect. Her behavior is normal.          Assessment & Plan:

## 2012-11-02 NOTE — Patient Instructions (Addendum)
We'll notify you of your lab results and decide the next steps Drink plenty of fluids- if pain or nausea, stick to clear liquids Ibuprofen as needed If no abnormality on labs or your symptoms worsen, we'll get the CT scan Call with any questions or concerns Hang in there!!

## 2012-11-04 NOTE — Assessment & Plan Note (Signed)
New.  Pt in no distress in office but does have TTP on PE.  Check labs to assess for underlying infxn or metabolic cause of pain.  Given chronicity of pain and intermittent nature, question whether pt is having problems w/ adhesions from previous surgery.  If pt continues to have difficulty will likely require CT but will try and hold off at this time due to her hx of multiple previous CTs.

## 2012-11-15 ENCOUNTER — Ambulatory Visit: Payer: Managed Care, Other (non HMO) | Admitting: Internal Medicine

## 2012-11-30 ENCOUNTER — Encounter: Payer: Self-pay | Admitting: Family Medicine

## 2012-11-30 ENCOUNTER — Telehealth: Payer: Self-pay | Admitting: *Deleted

## 2012-11-30 ENCOUNTER — Ambulatory Visit (INDEPENDENT_AMBULATORY_CARE_PROVIDER_SITE_OTHER): Payer: Managed Care, Other (non HMO) | Admitting: Family Medicine

## 2012-11-30 VITALS — BP 114/72 | HR 80 | Temp 99.3°F | Wt 164.4 lb

## 2012-11-30 DIAGNOSIS — S0990XA Unspecified injury of head, initial encounter: Secondary | ICD-10-CM

## 2012-11-30 DIAGNOSIS — S060X9A Concussion with loss of consciousness of unspecified duration, initial encounter: Secondary | ICD-10-CM

## 2012-11-30 NOTE — Patient Instructions (Signed)
Head Injury, Adult °You have had a head injury that does not appear serious at this time. A concussion is a state of changed mental ability, usually from a blow to the head. You should take clear liquids for the rest of the day and then resume your regular diet. You should not take sedatives or alcoholic beverages for as long as directed by your caregiver after discharge. After injuries such as yours, most problems occur within the first 24 hours. °SYMPTOMS °These minor symptoms may be experienced after discharge: °· Memory difficulties. °· Dizziness. °· Headaches. °· Double vision. °· Hearing difficulties. °· Depression. °· Tiredness. °· Weakness. °· Difficulty with concentration. °If you experience any of these problems, you should not be alarmed. A concussion requires a few days for recovery. Many patients with head injuries frequently experience such symptoms. Usually, these problems disappear without medical care. If symptoms last for more than one day, notify your caregiver. See your caregiver sooner if symptoms are becoming worse rather than better. °HOME CARE INSTRUCTIONS  °· During the next 24 hours you must stay with someone who can watch you for the warning signs listed below. °Although it is unlikely that serious side effects will occur, you should be aware of signs and symptoms which may necessitate your return to this location. Side effects may occur up to 7  10 days following the injury. It is important for you to carefully monitor your condition and contact your caregiver or seek immediate medical attention if there is a change in your condition. °SEEK IMMEDIATE MEDICAL CARE IF:  °· There is confusion or drowsiness. °· You can not awaken the injured person. °· There is nausea (feeling sick to your stomach) or continued, forceful vomiting. °· You notice dizziness or unsteadiness which is getting worse, or inability to walk. °· You have convulsions or unconsciousness. °· You experience severe,  persistent headaches not relieved by over-the-counter or prescription medicines for pain. (Do not take aspirin as this impairs clotting abilities). Take other pain medications only as directed. °· You can not use arms or legs normally. °· There is clear or bloody discharge from the nose or ears. °MAKE SURE YOU:  °· Understand these instructions. °· Will watch your condition. °· Will get help right away if you are not doing well or get worse. °Document Released: 06/06/2005 Document Revised: 08/29/2011 Document Reviewed: 04/24/2009 °ExitCare® Patient Information ©2014 [employer]. ° °

## 2012-11-30 NOTE — Assessment & Plan Note (Signed)
Check MRI brain If any inc headache, NV, MS change go to ER

## 2012-11-30 NOTE — Progress Notes (Signed)
  Subjective:    Patient ID: Victoria [name], female    DOB: 11-27-1955, 57 y.o.   MRN: [id number]  HPI Pt here c/o fall Monday pm.  She slipped and fell backwards in shower and hit her head ---+ LOC for a few seconds ,  + nauseous, dizzy for 20-74min.  No vomiting.   Review of Systems As above    Objective:   Physical Exam BP 114/72  Pulse 80  Temp(Src) 99.3 F (37.4 C) (Oral)  Wt 164 lb 6.4 oz (74.571 kg)  BMI 29.35 kg/m2  SpO2 97% General appearance: alert, cooperative, appears stated age and no distress Head: Normocephalic, without obvious abnormality, atraumatic Eyes: conjunctivae/corneas clear. PERRL, EOM's intact. Fundi benign. Ears: normal TM's and external ear canals both ears Extremities: extremities normal, atraumatic, no cyanosis or edema Neurologic: Alert and oriented X 3, normal strength and tone. Normal symmetric reflexes. Normal coordination and gait       Assessment & Plan:

## 2012-11-30 NOTE — Telephone Encounter (Addendum)
Normal MRI. Showed old left lacunar infarction, but neg for any recent trauma, hemorrhage, subdural fluid collection or parenchymal masses. Dr Laury Axon verbally advised and spoke with Pt about findings as well. Result will be faxed and scan to chart.

## 2012-12-10 ENCOUNTER — Encounter: Payer: Self-pay | Admitting: Family Medicine

## 2012-12-19 ENCOUNTER — Ambulatory Visit: Payer: Managed Care, Other (non HMO) | Admitting: Internal Medicine

## 2012-12-20 ENCOUNTER — Ambulatory Visit (INDEPENDENT_AMBULATORY_CARE_PROVIDER_SITE_OTHER): Payer: Managed Care, Other (non HMO) | Admitting: Internal Medicine

## 2012-12-20 ENCOUNTER — Encounter: Payer: Self-pay | Admitting: Internal Medicine

## 2012-12-20 VITALS — BP 100/60 | HR 78 | Temp 97.7°F | Ht 63.0 in | Wt 163.2 lb

## 2012-12-20 DIAGNOSIS — J45909 Unspecified asthma, uncomplicated: Secondary | ICD-10-CM

## 2012-12-20 NOTE — Assessment & Plan Note (Signed)
Glad you are doing well  Glad you lost weight on gluten free and asthma is better on glutern free Glad you are holding lung fucntion at baseline 66% off prednisone Continue prilosec, asmanex, spiriva, singulair, pro-air- cma will ensue refill Hold off daliresp due to side effects Start N. acetylcysteine 600 mg twice a day  - Chinese study published in Lancet British Journal in 2014 shows it helps   - this is not to make you feel better but to to prevent recurrent bronchitis episodes  - You are more likely to get this drug at [facility] or a vitamin store then at [facility] or [facility] or [facility]  - You can also get this at website www.vitacost.com  - It functions as an anti-oxidant and there are minimal/none side effects   #Followup  3-6 months or sooner if needed    

## 2012-12-20 NOTE — Progress Notes (Signed)
No chief complaint on file.   History of Present Illness: Victoria [name] is a 57 y.o. female with mild OSA.      HPI OV 12/20/2012  fU severe persistenbt asthma and abpa  - Since labor day 2013: on gluten free diet and lost 12#. On Memorial day felt so good off daily prednisone completely. Feels asthma is well controlled on asmnaex, spiriva and singulair though between feb-April had to be on pred bursts 3 times but since may 2014 no ae-asthma.   Spirometry today fev1 1.64L/66%  Past, Family, Social reviewed: no change since last visit; having knee pain when off prednisone Plans to see ortho and then maybe rheunm   Review of Systems  Constitutional: Negative for fever and unexpected weight change.  HENT: Negative for ear pain, nosebleeds, congestion, sore throat, rhinorrhea, sneezing, trouble swallowing, dental problem, postnasal drip and sinus pressure.   Eyes: Negative for redness and itching.  Respiratory: Negative for cough, chest tightness, shortness of breath and wheezing.   Cardiovascular: Negative for palpitations and leg swelling.  Gastrointestinal: Negative for nausea and vomiting.  Genitourinary: Negative for dysuria.  Musculoskeletal: Negative for joint swelling.  Skin: Negative for rash.  Neurological: Negative for headaches.  Hematological: Does not bruise/bleed easily.  Psychiatric/Behavioral: Negative for dysphoric mood. The patient is not nervous/anxious.    Current outpatient prescriptions:albuterol (PROAIR HFA) 108 (90 BASE) MCG/ACT inhaler, Inhale 2 puffs into the lungs every 6 (six) hours as needed. For shortness of breath, Disp: , Rfl: ;  aspirin 81 MG tablet, Take 81 mg by mouth daily. , Disp: , Rfl: ;  benzonatate (TESSALON) 100 MG capsule, Take 1-2 tablets by mouth three times daily as needed for cough, Disp: 50 capsule, Rfl: 0 chlorpheniramine-HYDROcodone (TUSSIONEX) 10-8 MG/5ML LQCR, Take 5 mLs by mouth at bedtime as needed. For cough, Disp: 140 mL, Rfl: 0;   Cholecalciferol (VITAMIN D) 2000 UNITS CAPS, Take 2,000 Units by mouth.  , Disp: , Rfl: ;  ibuprofen (ADVIL,MOTRIN) 200 MG tablet, Take 400 mg by mouth every 6 (six) hours as needed. For pain, Disp: , Rfl:  ipratropium-albuterol (DUONEB) 0.5-2.5 (3) MG/3ML SOLN, Take 3mL up to 4-6 times per day, Disp: 360 mL, Rfl: 2;  mometasone (ASMANEX) 220 MCG/INH inhaler, Inhale 2 puffs into the lungs every evening., Disp: 3 Inhaler, Rfl: 0;  montelukast (SINGULAIR) 10 MG tablet, Take 10 mg by mouth 2 (two) times daily., Disp: , Rfl: ;  Omega-3 Fatty Acids (OMEGA 3 PO), Take 1 capsule by mouth daily. , Disp: , Rfl:  Omeprazole-Sodium Bicarbonate (ZEGERID) 20-1100 MG CAPS, Take 1 capsule by mouth every morning., Disp: , Rfl: ;  OVER THE COUNTER MEDICATION, Take 4 capsules by mouth daily. Shaklee Vitamins Contains calcium, magnesium, vitamin d, vitamin k, zinc, copper, mananese, Disp: , Rfl: ;  tiotropium (SPIRIVA) 18 MCG inhalation capsule, Place 1 capsule (18 mcg total) into inhaler and inhale daily., Disp: 90 capsule, Rfl: 1 venlafaxine XR (EFFEXOR-XR) 150 MG 24 hr capsule, Take 1 capsule (150 mg total) by mouth daily., Disp: 90 capsule, Rfl: 1   Physical Exam  Vitals reviewed. Constitutional: She is oriented to person, place, and time. She appears well-developed and well-nourished. No distress.  Body mass index is 28.92 kg/(m^2).  Has lost weight  HENT:  Head: Normocephalic and atraumatic.  Right Ear: External ear normal.  Left Ear: External ear normal.  Mouth/Throat: Oropharynx is clear and moist. No oropharyngeal exudate.  Eyes: Conjunctivae and EOM are normal. Pupils are equal, round, and reactive  to light. Right eye exhibits no discharge. Left eye exhibits no discharge. No scleral icterus.  Neck: Normal range of motion. Neck supple. No JVD present. No tracheal deviation present. No thyromegaly present.  Cardiovascular: Normal rate, regular rhythm, normal heart sounds and intact distal pulses.  Exam  reveals no gallop and no friction rub.   No murmur heard. Pulmonary/Chest: Effort normal and breath sounds normal. No respiratory distress. She has no wheezes. She has no rales. She exhibits no tenderness.  Abdominal: Soft. Bowel sounds are normal. She exhibits no distension and no mass. There is no tenderness. There is no rebound and no guarding.  Musculoskeletal: Normal range of motion. She exhibits no edema and no tenderness.  Lymphadenopathy:    She has no cervical adenopathy.  Neurological: She is alert and oriented to person, place, and time. She has normal reflexes. No cranial nerve deficit. She exhibits normal muscle tone. Coordination normal.  Skin: Skin is warm and dry. No rash noted. She is not diaphoretic. No erythema. No pallor.  Psychiatric: She has a normal mood and affect. Her behavior is normal. Judgment and thought content normal.

## 2012-12-20 NOTE — Patient Instructions (Addendum)
Glad you are doing well  Glad you lost weight on gluten free and asthma is better on glutern free Glad you are holding lung fucntion at baseline 66% off prednisone Continue prilosec, asmanex, spiriva, singulair, pro-air- cma will ensue refill Hold off daliresp due to side effects Start N. acetylcysteine 600 mg twice a day  - [place] study published in Lancet [employer] Journal in 2014 shows it helps   - this is not to make you feel better but to to prevent recurrent bronchitis episodes  - You are more likely to get this drug at [facility] or a vitamin store then at [employer] or [employer] or [employer]  - You can also get this at website Constitution[employer].co.uk  - It functions as an anti-oxidant and there are minimal/none side effects   #Followup  3-6 months or sooner if needed

## 2012-12-28 ENCOUNTER — Other Ambulatory Visit: Payer: Self-pay | Admitting: Internal Medicine

## 2013-01-08 ENCOUNTER — Telehealth: Payer: Self-pay | Admitting: Internal Medicine

## 2013-01-08 MED ORDER — PREDNISONE 10 MG PO TABS: ORAL_TABLET | ORAL | Status: DC

## 2013-01-08 NOTE — Telephone Encounter (Signed)
Take prednisone 40 mg daily x 2 days, then 20mg  daily x 2 days, then 10mg  daily x 2 days, then 5mg  daily x 2 days and stop or go to baseline whatever the case might be  Dr. Kalman Shan, M.D., [facility].C.P Pulmonary and Critical Care Medicine Staff Physician [facility] Pager: [phone], If no answer or between  15:00h - 7:00h: call [phone]  01/08/2013 11:59 AM

## 2013-01-08 NOTE — Telephone Encounter (Signed)
rx sent and pt is aware. Jennifer Castillo, CMA  

## 2013-01-08 NOTE — Telephone Encounter (Signed)
Last OV 12-20-12. Pt states x 6 days she has been having wheezing, chest tightness, and a "barky" cough that is non-productive. Pt states she has ordered the NAC but is has not come in yet. Pt is requesting rx for prednisone. Please advise. Carron Curie, CMA Allergies  Allergen Reactions  . Beclomethasone Dipropionate Hives and Other (See Comments)     weight gain  . Budesonide-Formoterol Fumarate Hives  . Sulfonamide Derivatives Hives and Rash  . Mometasone Furo-Formoterol Fum Hives and Other (See Comments)    weight gain

## 2013-02-07 ENCOUNTER — Ambulatory Visit (INDEPENDENT_AMBULATORY_CARE_PROVIDER_SITE_OTHER): Payer: Managed Care, Other (non HMO) | Admitting: Family Medicine

## 2013-02-07 ENCOUNTER — Encounter: Payer: Self-pay | Admitting: Family Medicine

## 2013-02-07 VITALS — BP 114/70 | HR 97 | Temp 98.6°F | Wt 161.0 lb

## 2013-02-07 DIAGNOSIS — J45901 Unspecified asthma with (acute) exacerbation: Secondary | ICD-10-CM

## 2013-02-07 DIAGNOSIS — J209 Acute bronchitis, unspecified: Secondary | ICD-10-CM

## 2013-02-07 MED ORDER — IPRATROPIUM-ALBUTEROL 0.5-2.5 (3) MG/3ML IN SOLN
3.0000 mL | Freq: Once | RESPIRATORY_TRACT | Status: AC
  Administered 2013-02-07: 3 mL via RESPIRATORY_TRACT

## 2013-02-07 MED ORDER — PREDNISONE 10 MG PO TABS: ORAL_TABLET | ORAL | Status: DC

## 2013-02-07 MED ORDER — METHYLPREDNISOLONE ACETATE 80 MG/ML IJ SUSP
80.0000 mg | Freq: Once | INTRAMUSCULAR | Status: AC
  Administered 2013-02-07: 80 mg via INTRAMUSCULAR

## 2013-02-07 MED ORDER — AZITHROMYCIN 250 MG PO TABS: ORAL_TABLET | ORAL | Status: DC

## 2013-02-07 NOTE — Progress Notes (Signed)
  Subjective:     Victoria [name] is a 57 y.o. female who presents for evaluation of sinus pain. Symptoms include: congestion, cough, headaches, nasal congestion, purulent rhinorrhea, sinus pressure, sore throat and tooth pain. Onset of symptoms was 1 week ago. Symptoms have been gradually worsening since that time. Past history is significant for asthma. Patient is a non-smoker.  The following portions of the patient's history were reviewed and updated as appropriate: allergies, current medications, past family history, past medical history, past social history, past surgical history and problem list.  Review of Systems Pertinent items are noted in HPI.   Objective:    BP 114/70  Pulse 97  Temp(Src) 98.6 F (37 C) (Oral)  Wt 161 lb (73.029 kg)  BMI 28.53 kg/m2  SpO2 98% General appearance: alert, cooperative, appears stated age and no distress Ears: normal TM's and external ear canals both ears Nose: Nares normal. Septum midline. Mucosa normal. No drainage or sinus tenderness. Throat: lips, mucosa, and tongue normal; teeth and gums normal Neck: no adenopathy, supple, symmetrical, trachea midline and thyroid not enlarged, symmetric, no tenderness/mass/nodules Lungs: wheezes bilaterally Heart: S1, S2 normal    Assessment:    Acute bacterial bronchitis with exac asthma.    Plan:    Zithromax per medication orders. pred taper  con't with nebs and inhalers as directed See meds and orders

## 2013-02-07 NOTE — Patient Instructions (Addendum)

## 2013-03-27 ENCOUNTER — Ambulatory Visit: Payer: Managed Care, Other (non HMO) | Admitting: Family Medicine

## 2013-03-28 ENCOUNTER — Encounter: Payer: Self-pay | Admitting: Family Medicine

## 2013-03-28 ENCOUNTER — Ambulatory Visit (INDEPENDENT_AMBULATORY_CARE_PROVIDER_SITE_OTHER): Payer: Managed Care, Other (non HMO) | Admitting: Family Medicine

## 2013-03-28 VITALS — BP 114/76 | HR 82 | Temp 98.6°F

## 2013-03-28 DIAGNOSIS — J209 Acute bronchitis, unspecified: Secondary | ICD-10-CM

## 2013-03-28 MED ORDER — AZITHROMYCIN 250 MG PO TABS: ORAL_TABLET | ORAL | Status: DC

## 2013-03-28 MED ORDER — HYDROCOD POLST-CHLORPHEN POLST 10-8 MG/5ML PO LQCR: 5.0000 mL | Freq: Two times a day (BID) | ORAL | Status: DC

## 2013-03-28 NOTE — Progress Notes (Signed)
  Subjective:     Victoria [name] is a 57 y.o. female here for evaluation of a cough. Onset of symptoms was several weeks ago. Symptoms have been gradually worsening since that time. The cough is productive and is aggravated by dust, infection, pollens and reclining position. Associated symptoms include: shortness of breath, sputum production and wheezing. Patient does have a history of asthma. Patient does have a history of environmental allergens. Patient has traveled recently. Patient does not have a history of smoking. Patient has had a previous chest x-ray. Patient has not had a PPD done.  The following portions of the patient's history were reviewed and updated as appropriate: allergies, current medications, past family history, past medical history, past social history, past surgical history and problem list.  Review of Systems Pertinent items are noted in HPI.    Objective:    Oxygen saturation 99% on room air BP 114/76  Pulse 82  Temp(Src) 98.6 F (37 C) (Oral)  SpO2 99% General appearance: alert, cooperative, appears stated age and no distress Eyes: conjunctivae/corneas clear. PERRL, EOM's intact. Fundi benign. Ears: normal TM's and external ear canals both ears Nose: Nares normal. Septum midline. Mucosa normal. No drainage or sinus tenderness. Throat: lips, mucosa, and tongue normal; teeth and gums normal Neck: no adenopathy, supple, symmetrical, trachea midline and thyroid not enlarged, symmetric, no tenderness/mass/nodules Lungs: wheezes bilaterally Heart: S1, S2 normal Extremities: extremities normal, atraumatic, no cyanosis or edema    Assessment:    Acute Bronchitis --exac asthma   Plan:    Antibiotics per medication orders. Antitussives per medication orders. Avoid exposure to tobacco smoke and fumes. B-agonist inhaler. Call if shortness of breath worsens, blood in sputum, change in character of cough, development of fever or chills, inability to maintain nutrition  and hydration. Avoid exposure to tobacco smoke and fumes. Steroid inhaler as ordered.

## 2013-03-28 NOTE — Patient Instructions (Signed)

## 2013-04-09 ENCOUNTER — Other Ambulatory Visit: Payer: Self-pay | Admitting: Internal Medicine

## 2013-04-09 ENCOUNTER — Other Ambulatory Visit: Payer: Self-pay

## 2013-04-09 MED ORDER — VENLAFAXINE HCL ER 150 MG PO CP24: 150.0000 mg | ORAL_CAPSULE | Freq: Every day | ORAL | Status: DC

## 2013-04-15 ENCOUNTER — Other Ambulatory Visit: Payer: Self-pay | Admitting: Internal Medicine

## 2013-04-22 ENCOUNTER — Encounter: Payer: Self-pay | Admitting: Family Medicine

## 2013-04-22 ENCOUNTER — Telehealth: Payer: Self-pay | Admitting: Internal Medicine

## 2013-04-22 ENCOUNTER — Other Ambulatory Visit: Payer: Self-pay | Admitting: Family Medicine

## 2013-04-22 MED ORDER — VENLAFAXINE HCL ER 37.5 MG PO CP24: 37.5000 mg | ORAL_CAPSULE | Freq: Every day | ORAL | Status: DC

## 2013-04-22 NOTE — Telephone Encounter (Signed)
Called, spoke with pt.  C/o cough x 1 month.  Cough is occas prod with a small amount of beige/brown mucus but is mostly a dry, barking cough.  She also has wheezing and congestion at times with cough which is relieved with breathing tx.  No increased SOB or f/c/s.  Pt is taking allegra 180 qd, nasocort, and Ceritussin AC otc with no relief.  She was given a zpak by PCP with no relief.  Offered OV today with KC, but pt unable to come in today as she doesn't have childcare for her son.  We have scheduled pt to see RA in [place] tomorrow, Nov 4 at 1:45 pm.  Pt aware of appt date, time, and location.  I have also provided her with [place] office phone # incase it is needed.  She verbalized understanding and voiced no further questions or concerns at this time.

## 2013-04-23 ENCOUNTER — Encounter: Payer: Self-pay | Admitting: Pulmonary Disease

## 2013-04-23 ENCOUNTER — Ambulatory Visit (INDEPENDENT_AMBULATORY_CARE_PROVIDER_SITE_OTHER): Payer: Managed Care, Other (non HMO) | Admitting: Pulmonary Disease

## 2013-04-23 VITALS — BP 112/82 | HR 96 | Temp 98.2°F | Ht 63.0 in | Wt 159.0 lb

## 2013-04-23 DIAGNOSIS — B4481 Allergic bronchopulmonary aspergillosis: Secondary | ICD-10-CM

## 2013-04-23 MED ORDER — PREDNISONE 10 MG PO TABS: ORAL_TABLET | ORAL | Status: DC

## 2013-04-23 NOTE — Patient Instructions (Signed)
Prednisone 10 mg tabs  Take 2 tabs daily with food x 7ds, then 1 tab daily with food until you see Dr Marchelle Gearing Call for antibiotic if sputum turns yellow-green or if fever

## 2013-04-23 NOTE — Progress Notes (Signed)
  Subjective:    Patient ID: Victoria [name], female    DOB: 07-13-1955, 57 y.o.   MRN: [id number]  HPI  57 y.o. female with mild OSA,  severe persistent asthma and abpa .  HPI  OV 12/20/2012  On Memorial day felt so good off daily prednisone completely. Feels asthma is well controlled on asmnaex, spiriva and singulair though between feb-April had to be on pred bursts 3 times but since may 2014 no ae-asthma.  Spirometry today fev1 1.64L/66%  04/23/2013  Chief Complaint  Patient presents with  . Acute Visit    Pt c/o increased productive cough with tan phlegm, chest congestion and wheezing.  Pt was treated with a zpak by PCP but this has not helped.  Pt has been taking allegra, nasacort, and chertussin OTC.    C/o cough x 1 month. Cough is occas prod with a small amount of beige/brown mucus but is mostly a dry, barking cough. She also has wheezing and congestion at times with cough which is relieved with breathing tx. No increased SOB or f/c/s. Pt is taking allegra 180 qd, nasocort, and Ceritussin AC otc with no relief. She was given a zpak by PCP 1 mth ago with no relief.    Review of Systems neg for any significant sore throat, dysphagia, itching, sneezing, nasal congestion or excess/ purulent secretions, fever, chills, sweats, unintended wt loss, pleuritic or exertional cp, hempoptysis, orthopnea pnd or change in chronic leg swelling. Also denies presyncope, palpitations, heartburn, abdominal pain, nausea, vomiting, diarrhea or change in bowel or urinary habits, dysuria,hematuria, rash, arthralgias, visual complaints, headache, numbness weakness or ataxia.     Objective:   Physical Exam   Gen. Pleasant, well-nourished, in no distress, normal affect ENT - no lesions, no post nasal drip Neck: No JVD, no thyromegaly, no carotid bruits Lungs: no use of accessory muscles, no dullness to percussion, diffuse BL rhonchi  Cardiovascular: Rhythm regular, heart sounds  normal, no murmurs or  gallops, no peripheral edema Abdomen: soft and non-tender, no hepatosplenomegaly, BS normal. Musculoskeletal: No deformities, no cyanosis or clubbing Neuro:  alert, non focal        Assessment & Plan:

## 2013-04-24 NOTE — Assessment & Plan Note (Signed)
Treat as ABPA/a sthma flare Unclear if this is related to coming off steroids fw months ago  - no acute trigger identified  Prednisone 10 mg tabs  Take 2 tabs daily with food x 7ds, then 1 tab daily with food until you see Dr Marchelle Gearing - then consider coming offf prednisone completely if better Call for antibiotic if sputum turns yellow-green or if fever

## 2013-04-25 ENCOUNTER — Other Ambulatory Visit: Payer: Self-pay | Admitting: Family Medicine

## 2013-04-25 DIAGNOSIS — F411 Generalized anxiety disorder: Secondary | ICD-10-CM

## 2013-04-25 MED ORDER — VENLAFAXINE HCL ER 37.5 MG PO CP24: ORAL_CAPSULE | ORAL | Status: DC

## 2013-05-09 ENCOUNTER — Encounter: Payer: Self-pay | Admitting: Internal Medicine

## 2013-05-09 ENCOUNTER — Ambulatory Visit (INDEPENDENT_AMBULATORY_CARE_PROVIDER_SITE_OTHER): Payer: Managed Care, Other (non HMO) | Admitting: Internal Medicine

## 2013-05-09 VITALS — BP 108/70 | HR 75 | Ht 63.0 in | Wt 161.0 lb

## 2013-05-09 DIAGNOSIS — J45909 Unspecified asthma, uncomplicated: Secondary | ICD-10-CM

## 2013-05-09 DIAGNOSIS — Z23 Encounter for immunization: Secondary | ICD-10-CM

## 2013-05-09 NOTE — Progress Notes (Signed)
Subjective:    Patient ID: Victoria [name], female    DOB: Feb 10, 1956, 57 y.o.   MRN: [id number]  HPI   OV 05/09/2013  Chief Complaint  Patient presents with  . Asthma    follow-up. Pt states cough is much improved sicne OV on 04-23-13.     Followup severe persistent asthma but no longer on daily prednisone  Last seen on November 2014 by Dr. Vassie Loll for asthma exacerbation and given a short prednisone burst. After that she's back to baseline. Currently feels asthma is well controlled. Albuterol use is rare. No nocturnal awakenings. She continues all the medications. She still complains of sinus congestion. She is using Nasacort over the counter but wants to switch to a cheaper alternative so we will change her to generic fluticasone. Last Pneumovax was in 2006 and she will qualify for Prevnar. She's not had a flu shot but will have it today  Review of Systems  Constitutional: Negative for fever and unexpected weight change.  HENT: Negative for congestion, dental problem, ear pain, nosebleeds, postnasal drip, rhinorrhea, sinus pressure, sneezing, sore throat and trouble swallowing.   Eyes: Negative for redness and itching.  Respiratory: Negative for cough, chest tightness, shortness of breath and wheezing.   Cardiovascular: Negative for palpitations and leg swelling.  Gastrointestinal: Negative for nausea and vomiting.  Genitourinary: Negative for dysuria.  Musculoskeletal: Negative for joint swelling.  Skin: Negative for rash.  Neurological: Negative for headaches.  Hematological: Does not bruise/bleed easily.  Psychiatric/Behavioral: Negative for dysphoric mood. The patient is not nervous/anxious.    Current outpatient prescriptions:Acetylcysteine (NAC) 600 MG CAPS, Take 1 capsule by mouth 2 (two) times daily., Disp: , Rfl: ;  albuterol (PROAIR HFA) 108 (90 BASE) MCG/ACT inhaler, Inhale 2 puffs into the lungs every 6 (six) hours as needed. For shortness of breath, Disp: , Rfl: ;  ASMANEX  60 METERED DOSES 220 MCG/INH inhaler, USE 2 INHALATIONS ORALLY EVERY EVENING, Disp: 3 Inhaler, Rfl: 1 aspirin 81 MG tablet, Take 81 mg by mouth daily. , Disp: , Rfl: ;  benzonatate (TESSALON) 100 MG capsule, Take 1-2 tablets by mouth three times daily as needed for cough, Disp: 50 capsule, Rfl: 0;  Cholecalciferol (VITAMIN D) 2000 UNITS CAPS, Take 2,000 Units by mouth.  , Disp: , Rfl: ;  fexofenadine (ALLEGRA) 180 MG tablet, Take 180 mg by mouth daily., Disp: , Rfl:  ibuprofen (ADVIL,MOTRIN) 200 MG tablet, Take 400 mg by mouth every 6 (six) hours as needed. For pain, Disp: , Rfl: ;  ipratropium-albuterol (DUONEB) 0.5-2.5 (3) MG/3ML SOLN, Take 3mL up to 4-6 times per day, Disp: 360 mL, Rfl: 2;  montelukast (SINGULAIR) 10 MG tablet, TAKE 1 TABLET BY MOUTH TWICE DAILY, Disp: 180 tablet, Rfl: 3;  Omega-3 Fatty Acids (OMEGA 3 PO), Take 1 capsule by mouth daily. , Disp: , Rfl:  Omeprazole-Sodium Bicarbonate (ZEGERID) 20-1100 MG CAPS capsule, TAKE 1 CAPSULE BY MOUTH EVERY MORNING, Disp: 90 capsule, Rfl: 3;  OVER THE COUNTER MEDICATION, Take 4 capsules by mouth daily. Shaklee Vitamins Contains calcium, magnesium, vitamin d, vitamin k, zinc, copper, mananese, Disp: , Rfl: ;  SPIRIVA HANDIHALER 18 MCG inhalation capsule, INHALE CONTENTS OF 1 CAPSULE THROUGH HANDIHALER DEVICE EVERY DAY, Disp: 90 capsule, Rfl: 0 triamcinolone (NASACORT AQ) 55 MCG/ACT AERO nasal inhaler, Place 2 sprays into the nose daily., Disp: , Rfl: ;  venlafaxine XR (EFFEXOR XR) 37.5 MG 24 hr capsule, 3 po qd, Disp: 90 capsule, Rfl: 3      Objective:  Physical Exam  Vitals reviewed. Constitutional: She is oriented to person, place, and time. She appears well-developed and well-nourished. No distress.  Body mass index is 28.53 kg/(m^2).   HENT:  Head: Normocephalic and atraumatic.  Right Ear: External ear normal.  Left Ear: External ear normal.  Mouth/Throat: Oropharynx is clear and moist. No oropharyngeal exudate.  Eyes: Conjunctivae and  EOM are normal. Pupils are equal, round, and reactive to light. Right eye exhibits no discharge. Left eye exhibits no discharge. No scleral icterus.  Neck: Normal range of motion. Neck supple. No JVD present. No tracheal deviation present. No thyromegaly present.  Cardiovascular: Normal rate, regular rhythm, normal heart sounds and intact distal pulses.  Exam reveals no gallop and no friction rub.   No murmur heard. Pulmonary/Chest: Effort normal and breath sounds normal. No respiratory distress. She has no wheezes. She has no rales. She exhibits no tenderness.  Abdominal: Soft. Bowel sounds are normal. She exhibits no distension and no mass. There is no tenderness. There is no rebound and no guarding.  Musculoskeletal: Normal range of motion. She exhibits no edema and no tenderness.  Lymphadenopathy:    She has no cervical adenopathy.  Neurological: She is alert and oriented to person, place, and time. She has normal reflexes. No cranial nerve deficit. She exhibits normal muscle tone. Coordination normal.  Skin: Skin is warm and dry. No rash noted. She is not diaphoretic. No erythema. No pallor.  Psychiatric: She has a normal mood and affect. Her behavior is normal. Judgment and thought content normal.          Assessment & Plan:

## 2013-05-09 NOTE — Patient Instructions (Addendum)
Glad you are doing well after recent exacerbation 04/23/13 Glad you lost weight on gluten free and asthma is better on glutern free Continue prilosec, asmanex, spiriva, singulair, NAC, pro-air- cma will ensue refill Change nasacort to    - generic fluticasone inhaler 2 squirts each nostril daily; might be cheaper Have flu shot 05/09/2013 Have Prevnar vaccine 05/09/2013; we suspect insurance will pay for this not able to check on this   #Followup  3-6 months or sooner if needed

## 2013-05-09 NOTE — Assessment & Plan Note (Signed)
Glad you are doing well after recent exacerbation 04/23/13 Glad you lost weight on gluten free and asthma is better on glutern free Continue prilosec, asmanex, spiriva, singulair, NAC, pro-air- cma will ensue refill Change nasacort to    - generic fluticasone inhaler 2 squirts each nostril daily; might be cheaper Have flu shot 05/09/2013 Have Prevnar vaccine 05/09/2013; we suspect insurance will pay for this not able to check on this   #Followup  3-6 months or sooner if needed    

## 2013-05-09 NOTE — Addendum Note (Signed)
Addended by: Velvet Bathe on: 05/09/2013 11:57 AM   Modules accepted: Orders

## 2013-05-24 MED ORDER — VENLAFAXINE HCL ER 37.5 MG PO CP24: ORAL_CAPSULE | ORAL | Status: DC

## 2013-06-03 ENCOUNTER — Encounter: Payer: Self-pay | Admitting: Adult Health

## 2013-06-03 ENCOUNTER — Telehealth: Payer: Self-pay | Admitting: Internal Medicine

## 2013-06-03 ENCOUNTER — Ambulatory Visit (INDEPENDENT_AMBULATORY_CARE_PROVIDER_SITE_OTHER): Payer: Managed Care, Other (non HMO) | Admitting: Adult Health

## 2013-06-03 ENCOUNTER — Ambulatory Visit (HOSPITAL_BASED_OUTPATIENT_CLINIC_OR_DEPARTMENT_OTHER)
Admission: RE | Admit: 2013-06-03 | Discharge: 2013-06-03 | Disposition: A | Discharge status: Home / Self Care | Payer: Managed Care, Other (non HMO) | Source: Ambulatory Visit | Attending: Adult Health | Admitting: Adult Health

## 2013-06-03 VITALS — BP 106/70 | HR 80 | Temp 98.2°F | Ht 63.0 in | Wt 160.0 lb

## 2013-06-03 DIAGNOSIS — J984 Other disorders of lung: Secondary | ICD-10-CM | POA: Insufficient documentation

## 2013-06-03 DIAGNOSIS — J449 Chronic obstructive pulmonary disease, unspecified: Secondary | ICD-10-CM | POA: Insufficient documentation

## 2013-06-03 DIAGNOSIS — J45901 Unspecified asthma with (acute) exacerbation: Secondary | ICD-10-CM

## 2013-06-03 DIAGNOSIS — J209 Acute bronchitis, unspecified: Secondary | ICD-10-CM

## 2013-06-03 DIAGNOSIS — R059 Cough, unspecified: Secondary | ICD-10-CM | POA: Insufficient documentation

## 2013-06-03 DIAGNOSIS — R0989 Other specified symptoms and signs involving the circulatory and respiratory systems: Secondary | ICD-10-CM | POA: Insufficient documentation

## 2013-06-03 DIAGNOSIS — J4489 Other specified chronic obstructive pulmonary disease: Secondary | ICD-10-CM | POA: Insufficient documentation

## 2013-06-03 DIAGNOSIS — R05 Cough: Secondary | ICD-10-CM | POA: Insufficient documentation

## 2013-06-03 MED ORDER — PREDNISONE 10 MG PO TABS: ORAL_TABLET | ORAL | Status: DC

## 2013-06-03 MED ORDER — MOXIFLOXACIN HCL 400 MG PO TABS: 400.0000 mg | ORAL_TABLET | Freq: Every day | ORAL | Status: DC

## 2013-06-03 NOTE — Assessment & Plan Note (Signed)
Exacerbation after ? Almond pieces aspiration  No sign of obstructed airway on exam/VSS w/ 100% O2 sat  Will check cxr today .  Cover empiricly for possible aspiration  Have close follow up in 1 week   Plan  Avelox 400mg  daily for 7 days  Mucinex DM Twice daily  As needed  Cough/congestion  Prednisone taper over next week.  Use Duoneb As needed  Wheezing and shortness of breath.  May use Tussionex As needed  For cough .  Fluids and rest  Chest xray today  Avoid nuts for a while .  Follow up with family doctor for fatigue symptoms .  Please contact office for sooner follow up if symptoms do not improve or worsen or seek emergency care ] Follow up in 1 week, if not improving will need sooner follow up

## 2013-06-03 NOTE — Progress Notes (Signed)
  Subjective:    Patient ID: Victoria [name], female    DOB: [birth year], 56 y.o.   MRN: [id number]  HPI 57 y.o. female with mild OSA,  severe persistent asthma and abpa .     06/03/2013 Chief Complaint  Patient presents with  . Acute Visit    increased cough, wheezing and congestion for few weeks .  Was seen 6 weeks ago for acute asthmatic bronchitis , treated with steroid taper says she got better. Then cough and wheezing have been slowly coming back . Feels good on steroids. Over last 2 weeks more coughing and wheezing. Mainly dry with  Occasional white mucus.  Last night was eating almonds. Says after she swallowed small chewed up almonds pieces she become choked with severe coughing fits. Had several coughing fits to point she had pain into shoulders, arms and tingling into hands.  She felt pressure into neck and face when she would cough.  She had increased wheezing. No trouble swallowing or lip/tongue swelling. Had to take several neb treatments .  This morning cough up pieces of almonds she thinks. Cough is very violent. Denies hemoptysis, exertional chest pain, fever, leg swelling , orthopnea. No chest pain or exertional chest pain prior to cough or choking episode last pm. Chest pain is associated to coughing fits only.  She does complain over last few months of fatigue and low energy. Last labs were done earlier this year by PCP .  Encouraged her to have a recheck with PCP to investigate this.     Review of Systems  neg for any significant sore throat, dysphagia, itching, sneezing, nasal congestion or excess/ purulent secretions, fever, chills, sweats, unintended wt loss, pleuritic or exertional cp, hempoptysis, orthopnea pnd or change in chronic leg swelling. Also denies presyncope, palpitations, heartburn, abdominal pain, nausea, vomiting, diarrhea or change in bowel or urinary habits, dysuria,hematuria, rash, arthralgias, visual complaints, headache, numbness weakness or ataxia.    Objective:   Physical Exam    Gen. Pleasant, well-nourished, in no distress, normal affect ENT - no lesions, no post nasal drip, no swelling, pink moist mucosa w/out redness  Neck: No JVD, no thyromegaly, no carotid bruits, no stridor, normal swallow  Lungs: no use of accessory muscles, no dullness to percussion, few exp wheezes , talks in full sentences  Cardiovascular: Rhythm regular, heart sounds  normal, no murmurs or gallops, no peripheral edema, no calf pain , neg homans sign  Abdomen: soft and non-tender, no hepatosplenomegaly, BS normal. Musculoskeletal: No deformities, no cyanosis or clubbing Neuro:  alert, non focal, nml gait.         Assessment & Plan:

## 2013-06-03 NOTE — Patient Instructions (Addendum)
Avelox 400mg  daily for 7 days  Mucinex DM Twice daily  As needed  Cough/congestion  Prednisone taper over next week.  Use Duoneb As needed  Wheezing and shortness of breath.  May use Tussionex As needed  For cough .  Fluids and rest  Chest xray today  Avoid nuts for a while .  Follow up with family doctor for fatigue symptoms .  Please contact office for sooner follow up if symptoms do not improve or worsen or seek emergency care ] Follow up in 1 week, if not improving will need sooner follow up

## 2013-06-03 NOTE — Telephone Encounter (Signed)
I called and spoke with pt. She is scheduled to go to [facility] this AM to see TP. Nothing further needed

## 2013-06-10 ENCOUNTER — Encounter: Payer: Self-pay | Admitting: Adult Health

## 2013-06-10 ENCOUNTER — Ambulatory Visit (INDEPENDENT_AMBULATORY_CARE_PROVIDER_SITE_OTHER): Payer: Managed Care, Other (non HMO) | Admitting: Adult Health

## 2013-06-10 ENCOUNTER — Ambulatory Visit (INDEPENDENT_AMBULATORY_CARE_PROVIDER_SITE_OTHER)
Admission: RE | Admit: 2013-06-10 | Discharge: 2013-06-10 | Disposition: A | Discharge status: Home / Self Care | Payer: Managed Care, Other (non HMO) | Source: Ambulatory Visit | Attending: Adult Health | Admitting: Adult Health

## 2013-06-10 VITALS — BP 124/68 | HR 83 | Temp 97.1°F | Ht 63.0 in | Wt 163.4 lb

## 2013-06-10 DIAGNOSIS — J189 Pneumonia, unspecified organism: Secondary | ICD-10-CM

## 2013-06-10 NOTE — Assessment & Plan Note (Signed)
Recent PNA - s/p possible aspiration (choking on pieces of nuts )  CXR with RLL infiltrate - resolved on today films  Has completed course of Avelox   Plan  Mucinex DM Twice daily  As needed  Cough/congestion  Finish Prednisone taper over next week.  Use Duoneb As needed  Wheezing and shortness of breath.  May use Tussionex As needed  For cough .  Avoid nuts for a while .  Follow up with family doctor for fatigue symptoms .  Please contact office for sooner follow up if symptoms do not improve or worsen or seek emergency care ] Follow up in 6 weeks Dr. Marchelle Gearing

## 2013-06-10 NOTE — Progress Notes (Signed)
  Subjective:    Patient ID: Victoria [name], female    DOB: 1955/10/28, 57 y.o.   MRN: [id number]  HPI 57 y.o. female with mild OSA,  severe persistent asthma and abpa .     06/03/2013 Chief Complaint  Patient presents with  . Acute Visit    increased cough, wheezing and congestion for few weeks .  Was seen 6 weeks ago for acute asthmatic bronchitis , treated with steroid taper says she got better. Then cough and wheezing have been slowly coming back . Feels good on steroids. Over last 2 weeks more coughing and wheezing. Mainly dry with  Occasional white mucus.  Last night was eating almonds. Says after she swallowed small chewed up almonds pieces she become choked with severe coughing fits. Had several coughing fits to point she had pain into shoulders, arms and tingling into hands.  She felt pressure into neck and face when she would cough.  She had increased wheezing. No trouble swallowing or lip/tongue swelling. Had to take several neb treatments .  This morning cough up pieces of almonds she thinks. Cough is very violent. Denies hemoptysis, exertional chest pain, fever, leg swelling , orthopnea. No chest pain or exertional chest pain prior to cough or choking episode last pm. Chest pain is associated to coughing fits only.  She does complain over last few months of fatigue and low energy. Last labs were done earlier this year by PCP .  Encouraged her to have a recheck with PCP to investigate this.  >CXR with R basilar infitrate, tx for PNA w/ Avelox   06/10/2013 Follow up PNA  Pt was seen last week for 2 weeks of coughing . Night prior to ov with choking episode eating small pieces of almonds. CXR showed R basilar infiltrate ? Aspiration . Pt was treated with avelox x 7 days  She finished abx. Also with asthma flare , tx w/ steroid taper. She has few days left on steroids.  She says she is feeling better but still weak and lingering cough. Wheezing much better.  CXR today shows resolved  infitrate in right base .  She denies hemoptysis, chest pain, orthopnea, fever or n/v.      Review of Systems  neg for any significant sore throat, dysphagia, itching, sneezing, nasal congestion or excess/ purulent secretions, fever, chills, sweats, unintended wt loss, pleuritic or exertional cp, hempoptysis, orthopnea pnd or change in chronic leg swelling. Also denies presyncope, palpitations, heartburn, abdominal pain, nausea, vomiting, diarrhea or change in bowel or urinary habits, dysuria,hematuria, rash, arthralgias, visual complaints, headache, numbness weakness or ataxia.     Objective:   Physical Exam    Gen. Pleasant, well-nourished, in no distress, normal affect ENT - no lesions, no post nasal drip, no swelling, pink moist mucosa w/out redness  Neck: No JVD, no thyromegaly, no carotid bruits, no stridor, normal swallow  Lungs: no use of accessory muscles, no dullness to percussion. CTA , no wheezing   Cardiovascular: Rhythm regular, heart sounds  normal, no murmurs or gallops, no peripheral edema, no calf pain , neg homans sign  Abdomen: soft and non-tender, no hepatosplenomegaly, BS normal. Musculoskeletal: No deformities, no cyanosis or clubbing Neuro:  alert, non focal, nml gait.    CXR 06/10/2013  There is a degree of underlying emphysema. Currently no edema or  consolidation. Stable foramen of Bochdalek hernia on the left  posteriorly.      Assessment & Plan:

## 2013-06-10 NOTE — Patient Instructions (Signed)
Mucinex DM Twice daily  As needed  Cough/congestion  Finish Prednisone taper over next week.  Use Duoneb As needed  Wheezing and shortness of breath.  May use Tussionex As needed  For cough .  Avoid nuts for a while .  Follow up with family doctor for fatigue symptoms .  Please contact office for sooner follow up if symptoms do not improve or worsen or seek emergency care ] Follow up in 6 weeks Dr. Marchelle Gearing

## 2013-06-25 ENCOUNTER — Telehealth: Payer: Self-pay | Admitting: Internal Medicine

## 2013-06-25 MED ORDER — AZITHROMYCIN 250 MG PO TABS: ORAL_TABLET | ORAL | Status: DC

## 2013-06-25 MED ORDER — OSELTAMIVIR PHOSPHATE 75 MG PO CAPS: 75.0000 mg | ORAL_CAPSULE | Freq: Two times a day (BID) | ORAL | Status: DC

## 2013-06-25 MED ORDER — PREDNISONE 10 MG PO TABS: ORAL_TABLET | ORAL | Status: DC

## 2013-06-25 NOTE — Telephone Encounter (Signed)
Called and spoke with pt and she stated she is having the following x 6 days  Wheezing SOB Sneezing Cough with yellow sputum Nasal congestion with yellow drainage No energy and headaches.  Pt is requesting that something be called in to her pharmacy.  MR please advise. Thanks  Allergies  Allergen Reactions  . Beclomethasone Dipropionate Hives and Other (See Comments)     weight gain  . Budesonide-Formoterol Fumarate Hives  . Sulfonamide Derivatives Hives and Rash  . Mometasone Furo-Formoterol Fum Hives and Other (See Comments)    weight gain     Current Outpatient Prescriptions on File Prior to Visit  Medication Sig Dispense Refill  . Acetylcysteine (NAC) 600 MG CAPS Take 1 capsule by mouth 2 (two) times daily.      Marland Kitchen albuterol (PROAIR HFA) 108 (90 BASE) MCG/ACT inhaler Inhale 2 puffs into the lungs every 6 (six) hours as needed. For shortness of breath      . ASMANEX 60 METERED DOSES 220 MCG/INH inhaler USE 2 INHALATIONS ORALLY EVERY EVENING  3 Inhaler  1  . aspirin 81 MG tablet Take 81 mg by mouth daily.       . benzonatate (TESSALON) 100 MG capsule Take 1-2 tablets by mouth three times daily as needed for cough  50 capsule  0  . Cholecalciferol (VITAMIN D) 2000 UNITS CAPS Take 2,000 Units by mouth.        . fexofenadine (ALLEGRA) 180 MG tablet Take 180 mg by mouth daily.      Marland Kitchen ibuprofen (ADVIL,MOTRIN) 200 MG tablet Take 400 mg by mouth every 6 (six) hours as needed. For pain      . ipratropium-albuterol (DUONEB) 0.5-2.5 (3) MG/3ML SOLN Take 64mL up to 4-6 times per day  360 mL  2  . montelukast (SINGULAIR) 10 MG tablet TAKE 1 TABLET BY MOUTH TWICE DAILY  180 tablet  3  . Omega-3 Fatty Acids (OMEGA 3 PO) Take 1 capsule by mouth daily.       Earney Navy Bicarbonate (ZEGERID) 20-1100 MG CAPS capsule TAKE 1 CAPSULE BY MOUTH EVERY MORNING  90 capsule  3  . OVER THE COUNTER MEDICATION Take 4 capsules by mouth daily. Shaklee Vitamins Contains calcium, magnesium, vitamin d,  vitamin k, zinc, copper, mananese      . predniSONE (DELTASONE) 10 MG tablet 4 tabs for 2 days, then 3 tabs for 2 days, 2 tabs for 2 days, then 1 tab for 2 days, then stop  20 tablet  0  . SPIRIVA HANDIHALER 18 MCG inhalation capsule INHALE CONTENTS OF 1 CAPSULE THROUGH HANDIHALER DEVICE EVERY DAY  90 capsule  0  . triamcinolone (NASACORT AQ) 55 MCG/ACT AERO nasal inhaler Place 2 sprays into the nose daily.      Marland Kitchen venlafaxine XR (EFFEXOR XR) 37.5 MG 24 hr capsule 3 capsules by mouth daily  270 capsule  3   No current facility-administered medications on file prior to visit.

## 2013-06-25 NOTE — Telephone Encounter (Signed)
Called and spoke with pt and she is aware of MR recs.  Pt stated that she is aware of MR recs and she will try the tamiflu.  Pt voiced her understanding and nothing further is needed

## 2013-06-25 NOTE — Telephone Encounter (Signed)
She is in [facility]  If no flu shot or any flu exposure or any exposure to anyone with suspected flu: call in tamiflu 75mg  po twice daily  X 5days  Also call in   [place] - ensure no allergies  Take prednisone 40 mg daily x 2 days, then 20mg  daily x 2 days, then 10mg  daily x 2 days, then baseline daily dose or go to 5mg  daily x 2 days and stop

## 2013-06-26 ENCOUNTER — Encounter: Payer: Self-pay | Admitting: Internal Medicine

## 2013-06-27 NOTE — Telephone Encounter (Signed)
Allergies  Allergen Reactions  . Beclomethasone Dipropionate Hives and Other (See Comments)     weight gain  . Budesonide-Formoterol Fumarate Hives  . Sulfonamide Derivatives Hives and Rash  . Mometasone Furo-Formoterol Fum Hives and Other (See Comments)    weight gain   Current Outpatient Prescriptions on File Prior to Visit  Medication Sig Dispense Refill  . Acetylcysteine (NAC) 600 MG CAPS Take 1 capsule by mouth 2 (two) times daily.      Marland Kitchen albuterol (PROAIR HFA) 108 (90 BASE) MCG/ACT inhaler Inhale 2 puffs into the lungs every 6 (six) hours as needed. For shortness of breath      . ASMANEX 60 METERED DOSES 220 MCG/INH inhaler USE 2 INHALATIONS ORALLY EVERY EVENING  3 Inhaler  1  . aspirin 81 MG tablet Take 81 mg by mouth daily.       Marland Kitchen azithromycin (ZITHROMAX) 250 MG tablet Take as directed  6 tablet  0  . benzonatate (TESSALON) 100 MG capsule Take 1-2 tablets by mouth three times daily as needed for cough  50 capsule  0  . Cholecalciferol (VITAMIN D) 2000 UNITS CAPS Take 2,000 Units by mouth.        . fexofenadine (ALLEGRA) 180 MG tablet Take 180 mg by mouth daily.      Marland Kitchen ibuprofen (ADVIL,MOTRIN) 200 MG tablet Take 400 mg by mouth every 6 (six) hours as needed. For pain      . ipratropium-albuterol (DUONEB) 0.5-2.5 (3) MG/3ML SOLN Take 25mL up to 4-6 times per day  360 mL  2  . montelukast (SINGULAIR) 10 MG tablet TAKE 1 TABLET BY MOUTH TWICE DAILY  180 tablet  3  . Omega-3 Fatty Acids (OMEGA 3 PO) Take 1 capsule by mouth daily.       Earney Navy Bicarbonate (ZEGERID) 20-1100 MG CAPS capsule TAKE 1 CAPSULE BY MOUTH EVERY MORNING  90 capsule  3  . oseltamivir (TAMIFLU) 75 MG capsule Take 1 capsule (75 mg total) by mouth 2 (two) times daily.  10 capsule  0  . OVER THE COUNTER MEDICATION Take 4 capsules by mouth daily. Shaklee Vitamins Contains calcium, magnesium, vitamin d, vitamin k, zinc, copper, mananese      . predniSONE (DELTASONE) 10 MG tablet Take 4 tabs daily x 2 days, 2  tabs daily x 2 days, 1 tabs daily x 2 days, 1/2 tab daily until gone  15 tablet  0  . SPIRIVA HANDIHALER 18 MCG inhalation capsule INHALE CONTENTS OF 1 CAPSULE THROUGH HANDIHALER DEVICE EVERY DAY  90 capsule  0  . triamcinolone (NASACORT AQ) 55 MCG/ACT AERO nasal inhaler Place 2 sprays into the nose daily.      Marland Kitchen venlafaxine XR (EFFEXOR XR) 37.5 MG 24 hr capsule 3 capsules by mouth daily  270 capsule  3   No current facility-administered medications on file prior to visit.   CY - please advise in MR's absence. Thanks.

## 2013-06-27 NOTE — Telephone Encounter (Signed)
I didn't see this message until late. Please ask [name] to address in AM

## 2013-06-28 ENCOUNTER — Ambulatory Visit (INDEPENDENT_AMBULATORY_CARE_PROVIDER_SITE_OTHER): Payer: Managed Care, Other (non HMO) | Admitting: Adult Health

## 2013-06-28 ENCOUNTER — Encounter: Payer: Self-pay | Admitting: Adult Health

## 2013-06-28 VITALS — BP 108/66 | HR 86 | Temp 97.6°F | Ht 63.0 in | Wt 163.8 lb

## 2013-06-28 DIAGNOSIS — J45901 Unspecified asthma with (acute) exacerbation: Secondary | ICD-10-CM

## 2013-06-28 MED ORDER — HYDROCOD POLST-CHLORPHEN POLST 10-8 MG/5ML PO LQCR: 5.0000 mL | Freq: Two times a day (BID) | ORAL | Status: DC | PRN

## 2013-06-28 MED ORDER — AMOXICILLIN-POT CLAVULANATE 875-125 MG PO TABS: 1.0000 | ORAL_TABLET | Freq: Two times a day (BID) | ORAL | Status: AC

## 2013-06-28 MED ORDER — METHYLPREDNISOLONE ACETATE 80 MG/ML IJ SUSP
120.0000 mg | Freq: Once | INTRAMUSCULAR | Status: AC
  Administered 2013-06-28: 120 mg via INTRAMUSCULAR

## 2013-06-28 MED ORDER — PREDNISONE 10 MG PO TABS: ORAL_TABLET | ORAL | Status: DC

## 2013-06-28 NOTE — Addendum Note (Signed)
Addended by: Len Blalock on: 06/28/2013 04:31 PM   Modules accepted: Orders

## 2013-06-28 NOTE — Patient Instructions (Addendum)
Augmentin 875mg  Twice daily  For 7 days  Finish azithromycin and tamiflu as directed.  Probiotic daily  Eat a lot of yogurt.  Mucinex DM Twice daily  As needed  Cough/congestion  Prednisone taper over next week.  Use Duoneb As needed  Wheezing and shortness of breath.  May use Tussionex As needed  For cough .  Fluids and rest  Please contact office for sooner follow up if symptoms do not improve or worsen or seek emergency care  Follow up Dr. Chase Caller in 4 weeks and As needed

## 2013-06-28 NOTE — Telephone Encounter (Signed)
I called pt and apologized she did not get a response back yesterday. She is scheduled to come in and see TP this afternoon at 4 PM. Nothing further needed

## 2013-06-28 NOTE — Progress Notes (Signed)
Subjective:    Patient ID: Victoria [name], female    DOB: 1956-06-18, 58 y.o.   MRN: [id number]  HPI 58 y.o. female with mild OSA,  severe persistent asthma and abpa .     06/03/2013 Chief Complaint  Patient presents with  . Acute Visit    increased cough, wheezing and congestion for few weeks .  Was seen 6 weeks ago for acute asthmatic bronchitis , treated with steroid taper says she got better. Then cough and wheezing have been slowly coming back . Feels good on steroids. Over last 2 weeks more coughing and wheezing. Mainly dry with  Occasional white mucus.  Last night was eating almonds. Says after she swallowed small chewed up almonds pieces she become choked with severe coughing fits. Had several coughing fits to point she had pain into shoulders, arms and tingling into hands.  She felt pressure into neck and face when she would cough.  She had increased wheezing. No trouble swallowing or lip/tongue swelling. Had to take several neb treatments .  This morning cough up pieces of almonds she thinks. Cough is very violent. Denies hemoptysis, exertional chest pain, fever, leg swelling , orthopnea. No chest pain or exertional chest pain prior to cough or choking episode last pm. Chest pain is associated to coughing fits only.  She does complain over last few months of fatigue and low energy. Last labs were done earlier this year by PCP .  Encouraged her to have a recheck with PCP to investigate this.  >CXR with R basilar infitrate, tx for PNA w/ Avelox   06/10/2013 Follow up PNA  Pt was seen last week for 2 weeks of coughing . Night prior to ov with choking episode eating small pieces of almonds. CXR showed R basilar infiltrate ? Aspiration . Pt was treated with avelox x 7 days  She finished abx. Also with asthma flare , tx w/ steroid taper. She has few days left on steroids.  She says she is feeling better but still weak and lingering cough. Wheezing much better.  CXR today shows resolved  infitrate in right base .  She denies hemoptysis, chest pain, orthopnea, fever or n/v.  >>finish Prednisone   06/28/2013 Acute OV  Pt c/o sinus congestion, fatigue, runny nose, sneezing, wheezing, increased SOB, prod cough with yellow/beige mucous X8 days.  Pt states she is having a lot of bloody nasal drainage.  Took Airborne.  Husband similar symptoms No fever or body aches.  Sinus pressure. No known reflux or n/v.  Was called in Tamiflu, prednisone taper and Zpack on 06/25/12 .  Congestion is slightly better but still has significant cough and wheezing.   she denies any hemoptysis, orthopnea, PND, or leg swelling.   Review of Systems  neg for any significant sore throat, dysphagia, itching, sneezing,  fever, chills, sweats, unintended wt loss, pleuritic or exertional cp, hempoptysis, orthopnea pnd or change in chronic leg swelling. Also denies presyncope, palpitations, heartburn, abdominal pain, nausea, vomiting, diarrhea or change in bowel or urinary habits, dysuria,hematuria, rash, arthralgias, visual complaints, headache, numbness weakness or ataxia.     Objective:   Physical Exam    Gen. Pleasant, well-nourished, in no distress, normal affect ENT - no lesions, no post nasal drip, no swelling, pink moist mucosa w/out redness  Neck: No JVD, no thyromegaly, no carotid bruits, no stridor, normal swallow  Lungs: a few expiratory wheezes Cardiovascular: Rhythm regular, heart sounds  normal, no murmurs or gallops, no peripheral edema, no calf  pain , neg homans sign  Abdomen: soft and non-tender, no hepatosplenomegaly, BS normal. Musculoskeletal: No deformities, no cyanosis or clubbing Neuro:  alert, non focal, nml gait.    CXR 06/10/2013  There is a degree of underlying emphysema. Currently no edema or  consolidation. Stable foramen of Bochdalek hernia on the left  posteriorly.      Assessment & Plan:

## 2013-06-28 NOTE — Assessment & Plan Note (Signed)
Slow to resolve, exacerbation, with associated bronchitis, plus or minus influenza.  Plan  Augmentin 875mg  Twice daily  For 7 days  Finish azithromycin and tamiflu as directed.  Probiotic daily  Eat a lot of yogurt.  Mucinex DM Twice daily  As needed  Cough/congestion  Prednisone taper over next week.  Use Duoneb As needed  Wheezing and shortness of breath.  May use Tussionex As needed  For cough .  Fluids and rest  Please contact office for sooner follow up if symptoms do not improve or worsen or seek emergency care  Follow up Dr. Chase Caller in 4 weeks and As needed

## 2013-06-28 NOTE — Addendum Note (Signed)
Addended by: Len Blalock on: 06/28/2013 04:37 PM   Modules accepted: Orders

## 2013-07-08 ENCOUNTER — Ambulatory Visit (INDEPENDENT_AMBULATORY_CARE_PROVIDER_SITE_OTHER)
Admission: RE | Admit: 2013-07-08 | Discharge: 2013-07-08 | Disposition: A | Discharge status: Home / Self Care | Payer: Managed Care, Other (non HMO) | Source: Ambulatory Visit | Attending: Pulmonary Disease | Admitting: Pulmonary Disease

## 2013-07-08 ENCOUNTER — Encounter: Payer: Self-pay | Admitting: Pulmonary Disease

## 2013-07-08 ENCOUNTER — Telehealth: Payer: Self-pay | Admitting: Pulmonary Disease

## 2013-07-08 ENCOUNTER — Telehealth: Payer: Self-pay | Admitting: Internal Medicine

## 2013-07-08 ENCOUNTER — Ambulatory Visit (INDEPENDENT_AMBULATORY_CARE_PROVIDER_SITE_OTHER): Payer: Managed Care, Other (non HMO) | Admitting: Pulmonary Disease

## 2013-07-08 VITALS — BP 110/74 | HR 80 | Temp 98.2°F | Ht 63.0 in | Wt 162.0 lb

## 2013-07-08 DIAGNOSIS — B4481 Allergic bronchopulmonary aspergillosis: Secondary | ICD-10-CM

## 2013-07-08 MED ORDER — PREDNISONE 10 MG PO TABS: ORAL_TABLET | ORAL | Status: DC

## 2013-07-08 NOTE — Telephone Encounter (Signed)
Error.  Wrong doc.  Victoria [name] ° °

## 2013-07-08 NOTE — Assessment & Plan Note (Addendum)
No evidence of pneumonia on CXR today - will treat as acute bronchitis Has already received 2 rounds of antibiotics and this should suffice Increase prednisone to 40 mg daily x 1 week, once better can drop to 30 mg until seen by [name] - would suggest longer taper given her recurrent issues COugh syrup as needed for symptomatic

## 2013-07-08 NOTE — Patient Instructions (Signed)
CXR today Increase prednisone to 40 mg daily x 1 week, once better can drop to 30 mg until seen by [name] COugh syrup as needed

## 2013-07-08 NOTE — Telephone Encounter (Signed)
Pt has been scheduled to see RA at 2:15pm. Her son will also be seen at 2:15p. I got the okay from PW to double book the appointment.

## 2013-07-08 NOTE — Progress Notes (Signed)
   Subjective:    Patient ID: Victoria [name], female    DOB: [birth year], 58 y.o.   MRN: [id number]  HPI  58 y.o. female with mild OSA, severe persistent asthma and abpa .   06/03/2013 >Night prior to ov with choking episode eating small pieces of almonds>>CXR with R basilar infitrate, tx for PNA w/ Avelox     07/08/2013  Chief Complaint  Patient presents with  . Acute Visit    MR pt. Reports coughing with production of green mucus, SOB, chest tightness and wheezing. Saw TP 2 weeks ago but is not feeling any better.   06/28/2013 Acute OV  Pt c/o sinus congestion, fatigue, runny nose, sneezing, wheezing, increased SOB, prod cough with yellow/beige mucous X8 days. Pt states she is having a lot of bloody nasal drainage. Took Airborne.  Husband similar symptoms  No fever or body aches.  Sinus pressure. No known reflux or n/v.  Was called in Tamiflu, prednisone taper and Zpack on 06/25/12 .  Congestion is slightly better but still has significant cough and wheezing.  >>  augmentin + pred CXR 12/22 no consolidation  She is back with her son today due to persistent cough and wheezing CXR  - LLL plate like atx,Diaphragmatic hernia posteriorly on the left unchanged    Review of Systems neg for any significant sore throat, dysphagia, itching, sneezing, nasal congestion or excess/ purulent secretions, fever, chills, sweats, unintended wt loss, pleuritic or exertional cp, hempoptysis, orthopnea pnd or change in chronic leg swelling. Also denies presyncope, palpitations, heartburn, abdominal pain, nausea, vomiting, diarrhea or change in bowel or urinary habits, dysuria,hematuria, rash, arthralgias, visual complaints, headache, numbness weakness or ataxia.   Past Medical History  Diagnosis Date  . Asthma   . Obesity   . Maxillary sinusitis   . Osteoarthritis   . IBS (irritable bowel syndrome)   . Hyperlipidemia   . COPD (chronic obstructive pulmonary disease)   . Pulmonary nodules   .  Osteoporosis   . Nasal congestion 9/12    current cold  . Cough   . Wheezing   . GERD (gastroesophageal reflux disease)   . H/O hiatal hernia   . Depression     mild  . Allergic bronchopulmonary aspergillosis 2008    sees Dr Edmund Hilda pulmonology  . Anemia     iron deficiency, resolved  . Normal cardiac stress test 11/2011  . OSA (obstructive sleep apnea) 02/2012    has stopped using  cpap  . Anginal pain   . Chronic bronchitis   . Exertional dyspnea     "sometimes lying down; always w/exertion" (04/13/2012)  . Headache(784.0)     "often but not daily" (04/13/2012)  . Migraines   . Anxiety   . Pneumonia 11/2011    "before 2013 I hadn't had pneumonia since I was a child" (04/13/2012)       Objective:   Physical Exam  Gen. Pleasant, well-nourished, in no distress, normal affect ENT - no lesions, no post nasal drip Neck: No JVD, no thyromegaly, no carotid bruits Lungs: no use of accessory muscles, no dullness to percussion, clear without rales or rhonchi  Cardiovascular: Rhythm regular, heart sounds  normal, no murmurs or gallops, no peripheral edema Abdomen: soft and non-tender, no hepatosplenomegaly, BS normal. Musculoskeletal: No deformities, no cyanosis or clubbing Neuro:  alert, non focal       Assessment & Plan:

## 2013-07-10 ENCOUNTER — Ambulatory Visit: Payer: Managed Care, Other (non HMO) | Admitting: Family Medicine

## 2013-07-12 ENCOUNTER — Ambulatory Visit (INDEPENDENT_AMBULATORY_CARE_PROVIDER_SITE_OTHER): Payer: Managed Care, Other (non HMO) | Admitting: Family Medicine

## 2013-07-12 ENCOUNTER — Encounter: Payer: Self-pay | Admitting: Family Medicine

## 2013-07-12 ENCOUNTER — Ambulatory Visit: Payer: Managed Care, Other (non HMO) | Admitting: Family Medicine

## 2013-07-12 VITALS — BP 126/82 | HR 97 | Temp 98.6°F | Ht 63.0 in | Wt 161.0 lb

## 2013-07-12 DIAGNOSIS — R5381 Other malaise: Secondary | ICD-10-CM

## 2013-07-12 DIAGNOSIS — G4733 Obstructive sleep apnea (adult) (pediatric): Secondary | ICD-10-CM

## 2013-07-12 DIAGNOSIS — K219 Gastro-esophageal reflux disease without esophagitis: Secondary | ICD-10-CM

## 2013-07-12 DIAGNOSIS — R5383 Other fatigue: Secondary | ICD-10-CM

## 2013-07-12 DIAGNOSIS — IMO0001 Reserved for inherently not codable concepts without codable children: Secondary | ICD-10-CM

## 2013-07-12 DIAGNOSIS — M4120 Other idiopathic scoliosis, site unspecified: Secondary | ICD-10-CM

## 2013-07-12 DIAGNOSIS — E785 Hyperlipidemia, unspecified: Secondary | ICD-10-CM

## 2013-07-12 DIAGNOSIS — R030 Elevated blood-pressure reading, without diagnosis of hypertension: Secondary | ICD-10-CM

## 2013-07-12 DIAGNOSIS — J45909 Unspecified asthma, uncomplicated: Secondary | ICD-10-CM

## 2013-07-12 NOTE — Patient Instructions (Signed)

## 2013-07-12 NOTE — Progress Notes (Signed)
Pre visit review using our clinic review tool, if applicable. No additional management support is needed unless otherwise documented below in the visit note. 

## 2013-07-12 NOTE — Progress Notes (Signed)
Patient ID: Victoria [name], female   DOB: 1955/09/17, 58 y.o.   MRN: [id number] Victoria [name] [id number] [birth year] 07/12/2013      Progress Note-Follow Up  Subjective  Chief Complaint  Chief Complaint  Patient presents with  . Establish Care    new patient/ transfer from Dr Etter Sjogren    HPI  Patient is a 64 is in today to establish care. She is struggling with severe persistent asthma has recently had a flare with increased cough and headaches. Has been on Augmentin after a course of azithromycin and pulse closely with pathology. No fevers chills but cough is persistent. No fevers or chills. No malaise or myalgias on a routine basis. Taking medications as prescribed.  Past Medical History  Diagnosis Date  . Asthma   . Obesity   . Maxillary sinusitis   . Osteoarthritis   . IBS (irritable bowel syndrome)   . Hyperlipidemia   . COPD (chronic obstructive pulmonary disease)   . Pulmonary nodules   . Osteoporosis   . Nasal congestion 9/12    current cold  . Cough   . Wheezing   . GERD (gastroesophageal reflux disease)   . H/O hiatal hernia   . Depression     mild  . Allergic bronchopulmonary aspergillosis 2008    sees Dr Edmund Hilda pulmonology  . Anemia     iron deficiency, resolved  . Normal cardiac stress test 11/2011  . OSA (obstructive sleep apnea) 02/2012    has stopped using  cpap  . Anginal pain   . Chronic bronchitis   . Exertional dyspnea     "sometimes lying down; always w/exertion" (04/13/2012)  . Headache(784.0)     "often but not daily" (04/13/2012)  . Migraines   . Anxiety   . Pneumonia 11/2011    "before 2013 I hadn't had pneumonia since I was a child" (04/13/2012)    Past Surgical History  Procedure Laterality Date  . Cesarean section  1985  . Hernia repair  04/13/2012    VHR laparoscopic  . Appendectomy  1989  . Ventral hernia repair  04/13/2012    Procedure: LAPAROSCOPIC VENTRAL HERNIA;  Surgeon: Adin Hector, MD;  Location: [facility];   Service: General;  Laterality: N/A;  laparoscopic repair of incarcerated hernia    Family History  Problem Relation Age of Onset  . Breast cancer Mother   . Prostate cancer Father   . Diverticulosis Father   . Pulmonary embolism Brother     History   Social History  . Marital Status: Married    Spouse Name: N/A    Number of Children: 1  . Years of Education: N/A   Occupational History  .     Social History Main Topics  . Smoking status: Never Smoker   . Smokeless tobacco: Never Used  . Alcohol Use: 2.4 oz/week    4 Glasses of wine per week     Comment: 04/13/2012 "beer in the summer; wine 3-4 times/wk"  . Drug Use: No  . Sexual Activity: Yes   Other Topics Concern  . Not on file   Social History Narrative  . No narrative on file    Current Outpatient Prescriptions on File Prior to Visit  Medication Sig Dispense Refill  . Acetylcysteine (NAC) 600 MG CAPS Take 1 capsule by mouth 2 (two) times daily.      Marland Kitchen albuterol (PROAIR HFA) 108 (90 BASE) MCG/ACT inhaler Inhale 2 puffs into the lungs every 6 (six) hours as  needed. For shortness of breath      . ASMANEX 60 METERED DOSES 220 MCG/INH inhaler USE 2 INHALATIONS ORALLY EVERY EVENING  3 Inhaler  1  . aspirin 81 MG tablet Take 81 mg by mouth daily.       . benzonatate (TESSALON) 100 MG capsule Take 1-2 tablets by mouth three times daily as needed for cough  50 capsule  0  . chlorpheniramine-HYDROcodone (TUSSIONEX) 10-8 MG/5ML LQCR Take 5 mLs by mouth 2 (two) times daily as needed for cough.  240 mL  0  . Cholecalciferol (VITAMIN D) 2000 UNITS CAPS Take 2,000 Units by mouth.        . fexofenadine (ALLEGRA) 180 MG tablet Take 180 mg by mouth daily.      Marland Kitchen ibuprofen (ADVIL,MOTRIN) 200 MG tablet Take 400 mg by mouth every 6 (six) hours as needed. For pain      . montelukast (SINGULAIR) 10 MG tablet TAKE 1 TABLET BY MOUTH TWICE DAILY  180 tablet  3  . Omeprazole-Sodium Bicarbonate (ZEGERID) 20-1100 MG CAPS capsule TAKE 1  CAPSULE BY MOUTH EVERY MORNING  90 capsule  3  . OVER THE COUNTER MEDICATION Take 4 capsules by mouth daily. Shaklee Vitamins Contains calcium, magnesium, vitamin d, vitamin k, zinc, copper, mananese      . predniSONE (DELTASONE) 10 MG tablet Take 40mg  daily for 1 week, then decrease to 30mg  daily  60 tablet  0  . SPIRIVA HANDIHALER 18 MCG inhalation capsule INHALE CONTENTS OF 1 CAPSULE THROUGH HANDIHALER DEVICE EVERY DAY  90 capsule  0  . triamcinolone (NASACORT AQ) 55 MCG/ACT AERO nasal inhaler Place 2 sprays into the nose daily.      Marland Kitchen venlafaxine XR (EFFEXOR XR) 37.5 MG 24 hr capsule 3 capsules by mouth daily  270 capsule  3   No current facility-administered medications on file prior to visit.    Allergies  Allergen Reactions  . Beclomethasone Dipropionate Hives and Other (See Comments)     weight gain  . Budesonide-Formoterol Fumarate Hives  . Sulfonamide Derivatives Hives and Rash  . Mometasone Furo-Formoterol Fum Hives and Other (See Comments)    weight gain    Review of Systems  Review of Systems  Constitutional: Negative for fever and malaise/fatigue.  HENT: Negative for congestion.   Eyes: Negative for discharge.  Respiratory: Negative for shortness of breath.   Cardiovascular: Negative for chest pain, palpitations and leg swelling.  Gastrointestinal: Negative for nausea, abdominal pain and diarrhea.  Genitourinary: Negative for dysuria.  Musculoskeletal: Negative for falls.  Skin: Negative for rash.  Neurological: Negative for loss of consciousness and headaches.  Endo/Heme/Allergies: Negative for polydipsia.  Psychiatric/Behavioral: Negative for depression and suicidal ideas. The patient is not nervous/anxious and does not have insomnia.     Objective  BP 126/82  Pulse 97  Temp(Src) 98.6 F (37 C) (Oral)  Ht 5\' 3"  (1.6 m)  Wt 161 lb 0.6 oz (73.047 kg)  BMI 28.53 kg/m2  SpO2 97%  Physical Exam  Physical Exam  Constitutional: She is oriented to person,  place, and time and well-developed, well-nourished, and in no distress. No distress.  HENT:  Head: Normocephalic and atraumatic.  Right Ear: External ear normal.  Left Ear: External ear normal.  Nose: Nose normal.  Mouth/Throat: Oropharynx is clear and moist. No oropharyngeal exudate.  Eyes: Conjunctivae are normal. Pupils are equal, round, and reactive to light. Right eye exhibits no discharge. Left eye exhibits no discharge. No scleral icterus.  Neck:  Normal range of motion. Neck supple. No thyromegaly present.  Cardiovascular: Normal rate, regular rhythm, normal heart sounds and intact distal pulses.   No murmur heard. Pulmonary/Chest: Effort normal and breath sounds normal. No respiratory distress. She has no wheezes. She has no rales.  Abdominal: Soft. Bowel sounds are normal. She exhibits no distension and no mass. There is no tenderness.  Musculoskeletal: Normal range of motion. She exhibits no edema and no tenderness.  Lymphadenopathy:    She has no cervical adenopathy.  Neurological: She is alert and oriented to person, place, and time. She has normal reflexes. No cranial nerve deficit. Coordination normal.  Skin: Skin is warm and dry. No rash noted. She is not diaphoretic.  Psychiatric: Mood, memory and affect normal.    Lab Results  Component Value Date   TSH 1.65 09/07/2011   Lab Results  Component Value Date   WBC 9.1 11/02/2012   HGB 12.7 11/02/2012   HCT 37.7 11/02/2012   MCV 89.2 11/02/2012   PLT 320.0 11/02/2012   Lab Results  Component Value Date   CREATININE 0.7 11/02/2012   BUN 11 11/02/2012   NA 136 11/02/2012   K 3.3* 11/02/2012   CL 101 11/02/2012   CO2 29 11/02/2012   Lab Results  Component Value Date   ALT 30 11/02/2012   AST 23 11/02/2012   ALKPHOS 90 11/02/2012   BILITOT 0.3 11/02/2012   Lab Results  Component Value Date   CHOL 186 09/04/2012   Lab Results  Component Value Date   HDL 43.30 09/04/2012   Lab Results  Component Value Date   LDLCALC  125* 09/04/2012   Lab Results  Component Value Date   TRIG 90.0 09/04/2012   Lab Results  Component Value Date   CHOLHDL 4 09/04/2012     Assessment & Plan  ELEVATED BP READING WITHOUT DX HYPERTENSION Well controlled   Esophageal reflux Avoid offending foods, add probiotics, use meds prn  Fatigue Multifactorial, cares for adult son with CP without significant breaks  ASTHMA, PERSISTENT, SEVERE Follows closely with pulmonology on Augmentin at the moment for bronchitis after completing course of azithromycin.   HYPERLIPIDEMIA Agrees to repeat fasting labs prior to next visit.

## 2013-07-15 NOTE — Assessment & Plan Note (Signed)
Avoid offending foods, add probiotics, use meds prn

## 2013-07-15 NOTE — Assessment & Plan Note (Signed)
Agrees to repeat fasting labs prior to next visit.

## 2013-07-15 NOTE — Assessment & Plan Note (Signed)
Follows closely with pulmonology on Augmentin at the moment for bronchitis after completing course of azithromycin.

## 2013-07-15 NOTE — Assessment & Plan Note (Signed)
Multifactorial, cares for adult son with CP without significant breaks

## 2013-07-15 NOTE — Assessment & Plan Note (Signed)
Well controlled 

## 2013-07-27 ENCOUNTER — Emergency Department (HOSPITAL_COMMUNITY)
Admission: EM | Admit: 2013-07-27 | Discharge: 2013-07-27 | Disposition: A | Discharge status: Home / Self Care | Payer: Managed Care, Other (non HMO) | Attending: Emergency Medicine | Admitting: Emergency Medicine

## 2013-07-27 ENCOUNTER — Encounter (HOSPITAL_COMMUNITY): Payer: Self-pay | Admitting: Emergency Medicine

## 2013-07-27 ENCOUNTER — Emergency Department (HOSPITAL_COMMUNITY): Payer: Managed Care, Other (non HMO)

## 2013-07-27 DIAGNOSIS — K589 Irritable bowel syndrome without diarrhea: Secondary | ICD-10-CM | POA: Insufficient documentation

## 2013-07-27 DIAGNOSIS — F411 Generalized anxiety disorder: Secondary | ICD-10-CM | POA: Insufficient documentation

## 2013-07-27 DIAGNOSIS — M1990 Unspecified osteoarthritis, unspecified site: Secondary | ICD-10-CM | POA: Insufficient documentation

## 2013-07-27 DIAGNOSIS — I209 Angina pectoris, unspecified: Secondary | ICD-10-CM | POA: Insufficient documentation

## 2013-07-27 DIAGNOSIS — F3289 Other specified depressive episodes: Secondary | ICD-10-CM | POA: Insufficient documentation

## 2013-07-27 DIAGNOSIS — Z79899 Other long term (current) drug therapy: Secondary | ICD-10-CM | POA: Insufficient documentation

## 2013-07-27 DIAGNOSIS — Z7982 Long term (current) use of aspirin: Secondary | ICD-10-CM | POA: Insufficient documentation

## 2013-07-27 DIAGNOSIS — E669 Obesity, unspecified: Secondary | ICD-10-CM | POA: Insufficient documentation

## 2013-07-27 DIAGNOSIS — J449 Chronic obstructive pulmonary disease, unspecified: Secondary | ICD-10-CM | POA: Insufficient documentation

## 2013-07-27 DIAGNOSIS — Z862 Personal history of diseases of the blood and blood-forming organs and certain disorders involving the immune mechanism: Secondary | ICD-10-CM | POA: Insufficient documentation

## 2013-07-27 DIAGNOSIS — R079 Chest pain, unspecified: Secondary | ICD-10-CM

## 2013-07-27 DIAGNOSIS — J4489 Other specified chronic obstructive pulmonary disease: Secondary | ICD-10-CM | POA: Insufficient documentation

## 2013-07-27 DIAGNOSIS — R0789 Other chest pain: Secondary | ICD-10-CM | POA: Insufficient documentation

## 2013-07-27 DIAGNOSIS — R002 Palpitations: Secondary | ICD-10-CM | POA: Insufficient documentation

## 2013-07-27 DIAGNOSIS — Z8701 Personal history of pneumonia (recurrent): Secondary | ICD-10-CM | POA: Insufficient documentation

## 2013-07-27 DIAGNOSIS — F329 Major depressive disorder, single episode, unspecified: Secondary | ICD-10-CM | POA: Insufficient documentation

## 2013-07-27 DIAGNOSIS — G4733 Obstructive sleep apnea (adult) (pediatric): Secondary | ICD-10-CM | POA: Insufficient documentation

## 2013-07-27 DIAGNOSIS — K219 Gastro-esophageal reflux disease without esophagitis: Secondary | ICD-10-CM | POA: Insufficient documentation

## 2013-07-27 DIAGNOSIS — IMO0002 Reserved for concepts with insufficient information to code with codable children: Secondary | ICD-10-CM | POA: Insufficient documentation

## 2013-07-27 LAB — BASIC METABOLIC PANEL
BUN: 14 mg/dL (ref 6–23)
CO2: 24 mEq/L (ref 19–32)
Calcium: 9.1 mg/dL (ref 8.4–10.5)
Chloride: 100 mEq/L (ref 96–112)
Creatinine, Ser: 0.67 mg/dL (ref 0.50–1.10)
GFR calc non Af Amer: >90 mL/min (ref >90)
Glucose, Bld: 113 mg/dL — ABNORMAL HIGH (ref 70–99)
POTASSIUM: 3.5 meq/L — AB (ref 3.7–5.3)
Sodium: 139 mEq/L (ref 137–147)

## 2013-07-27 LAB — POCT I-STAT TROPONIN I: Troponin i, poc: 0 ng/mL (ref 0.00–0.08)

## 2013-07-27 LAB — CBC
HCT: 42.7 % (ref 36.0–46.0)
Hemoglobin: 14.4 g/dL (ref 12.0–15.0)
MCH: 30.7 pg (ref 26.0–34.0)
MCHC: 33.7 g/dL (ref 30.0–36.0)
MCV: 91 fL (ref 78.0–100.0)
Platelets: 249 10*3/uL (ref 150–400)
RBC: 4.69 MIL/uL (ref 3.87–5.11)
RDW: 14.4 % (ref 11.5–15.5)
WBC: 10.3 10*3/uL (ref 4.0–10.5)

## 2013-07-27 NOTE — ED Notes (Signed)
Patient transported to X-ray 

## 2013-07-27 NOTE — Discharge Instructions (Signed)
Chest Pain (Nonspecific) °It is often hard to give a specific diagnosis for the cause of chest pain. There is always a chance that your pain could be related to something serious, such as a heart attack or a blood clot in the lungs. You need to follow up with your caregiver for further evaluation. °CAUSES  °· Heartburn. °· Pneumonia or bronchitis. °· Anxiety or stress. °· Inflammation around your heart (pericarditis) or lung (pleuritis or pleurisy). °· A blood clot in the lung. °· A collapsed lung (pneumothorax). It can develop suddenly on its own (spontaneous pneumothorax) or from injury (trauma) to the chest. °· Shingles infection (herpes zoster virus). °The chest wall is composed of bones, muscles, and cartilage. Any of these can be the source of the pain. °· The bones can be bruised by injury. °· The muscles or cartilage can be strained by coughing or overwork. °· The cartilage can be affected by inflammation and become sore (costochondritis). °DIAGNOSIS  °Lab tests or other studies, such as X-rays, electrocardiography, stress testing, or cardiac imaging, may be needed to find the cause of your pain.  °TREATMENT  °· Treatment depends on what may be causing your chest pain. Treatment may include: °· Acid blockers for heartburn. °· Anti-inflammatory medicine. °· Pain medicine for inflammatory conditions. °· Antibiotics if an infection is present. °· You may be advised to change lifestyle habits. This includes stopping smoking and avoiding alcohol, caffeine, and chocolate. °· You may be advised to keep your head raised (elevated) when sleeping. This reduces the chance of acid going backward from your stomach into your esophagus. °· Most of the time, nonspecific chest pain will improve within 2 to 3 days with rest and mild pain medicine. °HOME CARE INSTRUCTIONS  °· If antibiotics were prescribed, take your antibiotics as directed. Finish them even if you start to feel better. °· For the next few days, avoid physical  activities that bring on chest pain. Continue physical activities as directed. °· Do not smoke. °· Avoid drinking alcohol. °· Only take over-the-counter or prescription medicine for pain, discomfort, or fever as directed by your caregiver. °· Follow your caregiver's suggestions for further testing if your chest pain does not go away. °· Keep any follow-up appointments you made. If you do not go to an appointment, you could develop lasting (chronic) problems with pain. If there is any problem keeping an appointment, you must call to reschedule. °SEEK MEDICAL CARE IF:  °· You think you are having problems from the medicine you are taking. Read your medicine instructions carefully. °· Your chest pain does not go away, even after treatment. °· You develop a rash with blisters on your chest. °SEEK IMMEDIATE MEDICAL CARE IF:  °· You have increased chest pain or pain that spreads to your arm, neck, jaw, back, or abdomen. °· You develop shortness of breath, an increasing cough, or you are coughing up blood. °· You have severe back or abdominal pain, feel nauseous, or vomit. °· You develop severe weakness, fainting, or chills. °· You have a fever. °THIS IS AN EMERGENCY. Do not wait to see if the pain will go away. Get medical help at once. Call your local emergency services (911 in [place] Do not drive yourself to the hospital. °MAKE SURE YOU:  °· Understand these instructions. °· Will watch your condition. °· Will get help right away if you are not doing well or get worse. °Document Released: 03/16/2005 Document Revised: 08/29/2011 Document Reviewed: 01/10/2008 °ExitCare® Patient Information ©2014 [employer]   [employer]. ° °

## 2013-07-27 NOTE — ED Notes (Signed)
Pt reports chest pain for past 9 days, she thought it was indigestion or stress, but it has persisted and she started to feel palpitations. She has been on prednisone for URI since jan 1. Denies cough,c old, fevers. A&ox4, resp e/u

## 2013-07-27 NOTE — ED Provider Notes (Signed)
CSN: [id number]     Arrival date & time 07/27/13  1341 History   First MD Initiated Contact with Patient 07/27/13 1423     Chief Complaint  Patient presents with  . Chest Pain   (Consider location/radiation/quality/duration/timing/severity/associated sxs/prior Treatment) Patient is a 58 y.o. female presenting with chest pain. The history is provided by the patient.  Chest Pain  She presents for evaluation of chest pain that has been present for 9 days. She has been pain that lasts about 30 minutes, come and go spontaneously, and are felt as a squeezing sensation she also has had periods of palpitations without near-syncope or syncope. She is on a prolonged prednisone taper for recurrent and prolonged pulmonary symptoms over the last 2 months. She denies cough, persistent shortness of breath, fever, chills, nausea, vomiting. She is using her usual medications without relief. She has had a stress test that was reassuring, within last 18 months. There are no other known modifying factors  Past Medical History  Diagnosis Date  . Asthma   . Obesity   . Maxillary sinusitis   . Osteoarthritis   . IBS (irritable bowel syndrome)   . Hyperlipidemia   . COPD (chronic obstructive pulmonary disease)   . Pulmonary nodules   . Osteoporosis   . Nasal congestion 9/12    current cold  . Cough   . Wheezing   . GERD (gastroesophageal reflux disease)   . H/O hiatal hernia   . Depression     mild  . Allergic bronchopulmonary aspergillosis 2008    sees Dr Edmund Hilda pulmonology  . Anemia     iron deficiency, resolved  . Normal cardiac stress test 11/2011  . OSA (obstructive sleep apnea) 02/2012    has stopped using  cpap  . Anginal pain   . Chronic bronchitis   . Exertional dyspnea     "sometimes lying down; always w/exertion" (04/13/2012)  . Headache(784.0)     "often but not daily" (04/13/2012)  . Migraines   . Anxiety   . Pneumonia 11/2011    "before 2013 I hadn't had pneumonia since  I was a child" (04/13/2012)   Past Surgical History  Procedure Laterality Date  . Cesarean section  1985  . Hernia repair  04/13/2012    VHR laparoscopic  . Appendectomy  1989  . Ventral hernia repair  04/13/2012    Procedure: LAPAROSCOPIC VENTRAL HERNIA;  Surgeon: Adin Hector, MD;  Location: [facility];  Service: General;  Laterality: N/A;  laparoscopic repair of incarcerated hernia   Family History  Problem Relation Age of Onset  . Breast cancer Mother   . Prostate cancer Father   . Diverticulosis Father   . Cancer Father     prostate  . Pulmonary embolism Brother     recurrent  . Cancer Sister     breast   History  Substance Use Topics  . Smoking status: Never Smoker   . Smokeless tobacco: Never Used  . Alcohol Use: 2.4 oz/week    4 Glasses of wine per week     Comment: 04/13/2012 "beer in the summer; wine 3-4 times/wk"   OB History   Grav Para Term Preterm Abortions TAB SAB Ect Mult Living                 Review of Systems  Cardiovascular: Positive for chest pain.  All other systems reviewed and are negative.    Allergies  Beclomethasone dipropionate; Budesonide-formoterol fumarate; Sulfonamide derivatives; Mometasone furo-formoterol  fum; and Statins  Home Medications   Current Outpatient Rx  Name  Route  Sig  Dispense  Refill  . Acetylcysteine (NAC) 600 MG CAPS   Oral   Take 1 capsule by mouth 2 (two) times daily.         Marland Kitchen albuterol (PROAIR HFA) 108 (90 BASE) MCG/ACT inhaler   Inhalation   Inhale 2 puffs into the lungs every 6 (six) hours as needed. For shortness of breath         . ASMANEX 60 METERED DOSES 220 MCG/INH inhaler      USE 2 INHALATIONS ORALLY EVERY EVENING   3 Inhaler   1   . aspirin 81 MG tablet   Oral   Take 81 mg by mouth daily.          . benzonatate (TESSALON) 100 MG capsule      Take 1-2 tablets by mouth three times daily as needed for cough   50 capsule   0   . chlorpheniramine-HYDROcodone (TUSSIONEX) 10-8  MG/5ML LQCR   Oral   Take 5 mLs by mouth 2 (two) times daily as needed for cough.   240 mL   0   . Cholecalciferol (VITAMIN D) 2000 UNITS CAPS   Oral   Take 2,000 Units by mouth.           . Cod Liver Oil OIL   Oral   Take by mouth. 1 teaspoon daily         . fexofenadine (ALLEGRA) 180 MG tablet   Oral   Take 180 mg by mouth daily.         Marland Kitchen ibuprofen (ADVIL,MOTRIN) 200 MG tablet   Oral   Take 400 mg by mouth every 6 (six) hours as needed. For pain         . ipratropium-albuterol (DUONEB) 0.5-2.5 (3) MG/3ML SOLN      Take 25mL up to 4-6 times per day. prn         . montelukast (SINGULAIR) 10 MG tablet      TAKE 1 TABLET BY MOUTH TWICE DAILY   180 tablet   3   . Omeprazole-Sodium Bicarbonate (ZEGERID) 20-1100 MG CAPS capsule      TAKE 1 CAPSULE BY MOUTH EVERY MORNING   90 capsule   3   . OVER THE COUNTER MEDICATION   Oral   Take 4 capsules by mouth daily. Shaklee Vitamins Contains calcium, magnesium, vitamin d, vitamin k, zinc, copper, mananese         . predniSONE (DELTASONE) 10 MG tablet      Take 40mg  daily for 1 week, then decrease to 30mg  daily   60 tablet   0   . SPIRIVA HANDIHALER 18 MCG inhalation capsule      INHALE CONTENTS OF 1 CAPSULE THROUGH HANDIHALER DEVICE EVERY DAY   90 capsule   0   . triamcinolone (NASACORT AQ) 55 MCG/ACT AERO nasal inhaler   Nasal   Place 2 sprays into the nose daily.         Marland Kitchen venlafaxine XR (EFFEXOR XR) 37.5 MG 24 hr capsule      3 capsules by mouth daily   270 capsule   3    BP 123/77  Pulse 87  Temp(Src) 98.4 F (36.9 C) (Oral)  Resp 20  SpO2 99% Physical Exam  Nursing note and vitals reviewed. Constitutional: She is oriented to person, place, and time. She appears well-developed and well-nourished. No distress.  HENT:  Head: Normocephalic and atraumatic.  Eyes: Conjunctivae and EOM are normal. Pupils are equal, round, and reactive to light.  Neck: Normal range of motion and phonation  normal. Neck supple.  Cardiovascular: Normal rate, regular rhythm and intact distal pulses.   Pulmonary/Chest: Effort normal and breath sounds normal. She exhibits no tenderness.  Abdominal: Soft. She exhibits no distension. There is no tenderness. There is no guarding.  Musculoskeletal: Normal range of motion. She exhibits no edema and no tenderness.  Neurological: She is alert and oriented to person, place, and time. She exhibits normal muscle tone.  Skin: Skin is warm and dry.  Psychiatric: She has a normal mood and affect. Her behavior is normal. Judgment and thought content normal.    ED Course  Procedures (including critical care time)   Findings discussed with patient. She understands that there are no good. Findings for acute coronary, or cardiac injury, problems. All questions are answered.   Labs Review Labs Reviewed  BASIC METABOLIC PANEL - Abnormal; Notable for the following:    Potassium 3.5 (*)    Glucose, Bld 113 (*)    All other components within normal limits  CBC  POCT I-STAT TROPONIN I   Imaging Review Dg Chest 2 View  07/27/2013   CLINICAL DATA:  Chest pain  EXAM: CHEST  2 VIEW  COMPARISON:  July 08, 2013  FINDINGS: There is stable apical pleural thickening bilaterally. There is scarring in the right upper lobe, stable. There is no edema or consolidation. The heart size and pulmonary vascularity are normal. No adenopathy. There is mild central peribronchial thickening, stable.  There is a foramen of Bochdalek hernia on the left posteriorly.  IMPRESSION: Areas of central peribronchial thickening consistent with chronic bronchitis. Scarring in right upper lobe, stable. There is also apical pleural thickening bilaterally, stable. There is no edema or consolidation. There is a foramen of Bochdalek hernia on the left, stable.   Electronically Signed   By: Lowella Grip M.D.   On: 07/27/2013 14:23    EKG Interpretation    Date/Time:  Saturday July 27 2013  13:47:33 EST Ventricular Rate:  92 PR Interval:  146 QRS Duration: 74 QT Interval:  330 QTC Calculation: 408 R Axis:   61 Text Interpretation:  Normal sinus rhythm Normal ECG since last tracing no significant change Confirmed by Amandeep Hogston  MD, Lacy Sofia ([id number]) on 07/27/2013 2:23:43 PM            MDM  No diagnosis found.   Nonspecific chest pain, with palpitations. Doubt ACS, pneumonia, metabolic instability, or serious bacterial infection. She does not have a clinical picture that is consistent with pulmonary emboli. She is low risk for venous thromboembolism. She is on a prolonged Prednisone taper raising the possibility that this is GI related  Nursing Notes Reviewed/ Care Coordinated Applicable Imaging Reviewed Interpretation of Laboratory Data incorporated into ED treatment  The patient appears reasonably screened and/or stabilized for discharge and I doubt any other medical condition or other [facility] requiring further screening, evaluation, or treatment in the ED at this time prior to discharge.  Plan: Home Medications- had Maalox AC/HS; Home Treatments- rest, fluids; return here if the recommended treatment, does not improve the symptoms; Recommended follow up- PCP and cardiology, as needed     Richarda Blade, MD 07/27/13 1506

## 2013-07-30 ENCOUNTER — Ambulatory Visit: Payer: Managed Care, Other (non HMO) | Admitting: Internal Medicine

## 2013-07-31 ENCOUNTER — Telehealth: Payer: Self-pay

## 2013-07-31 NOTE — Telephone Encounter (Signed)
Pt left a message stating that she had labwork done on 07-29-13 and is wanting to know the results? Pt stated that she is "not right". Pt states she has been having chest pain, heart rate is up 80-100, palpitations, fatigue. Pt went to ER on Saturday but was sent home. Pt thought it was the stress of her son but now that he is better she realizes this is still going on?  I left a message for pt to return my call and let me know where her labs were done at?  I don't see results?

## 2013-08-01 NOTE — Telephone Encounter (Signed)
Received 07/29/13 lab results and will place in [facility]'s box.

## 2013-08-01 NOTE — Telephone Encounter (Signed)
Spoke to [place] at [facility] and she will be faxing 07/29/13 results.

## 2013-08-01 NOTE — Telephone Encounter (Signed)
Patient left message she had her labs done on 07-29-2013 @ [facility] on [address]

## 2013-08-01 NOTE — Telephone Encounter (Signed)
Can you please call [facility] and get the patients lab results from 07-29-13

## 2013-08-08 ENCOUNTER — Telehealth: Payer: Self-pay | Admitting: Family Medicine

## 2013-08-08 NOTE — Telephone Encounter (Signed)
Patient states you called her and left a message for her to call you today.  She will be home all day

## 2013-08-09 MED ORDER — ALPRAZOLAM 0.5 MG PO TABS: 0.5000 mg | ORAL_TABLET | Freq: Two times a day (BID) | ORAL | Status: DC | PRN

## 2013-08-09 NOTE — Telephone Encounter (Signed)
Pt requesting call back, need to know lab results and still having palpations.

## 2013-08-09 NOTE — Telephone Encounter (Signed)
Spoke to [place] at [facility]; requested copy of lab results done on 02.09.15 to be faxed to office for Dr. Charlett Blake to sign off on & pt to be informed today. Per Dr. Charlett Blake, VO; Alprazolam 0.5 mg phoned in to pt's pharmacy: 1 tab BID Prn Anxiety, Palpitations, SOB; if no relief, either return to ED or Saturday [facility] at [place] office. Pt understood & agreed/SLS Pt's lab results show no reason for current problems, but OV needed per Dr. Charlett Blake to discuss cholesterol results & ongoing issues; pt schedule for Mon, 02.23.15 at 3:00p, provider informed/SLSL

## 2013-08-12 ENCOUNTER — Ambulatory Visit (INDEPENDENT_AMBULATORY_CARE_PROVIDER_SITE_OTHER): Payer: Managed Care, Other (non HMO) | Admitting: Family Medicine

## 2013-08-12 ENCOUNTER — Encounter: Payer: Self-pay | Admitting: Family Medicine

## 2013-08-12 ENCOUNTER — Telehealth: Payer: Self-pay | Admitting: Family Medicine

## 2013-08-12 VITALS — BP 110/80 | HR 91 | Temp 98.1°F | Ht 63.0 in | Wt 164.1 lb

## 2013-08-12 DIAGNOSIS — J45909 Unspecified asthma, uncomplicated: Secondary | ICD-10-CM

## 2013-08-12 DIAGNOSIS — E669 Obesity, unspecified: Secondary | ICD-10-CM

## 2013-08-12 DIAGNOSIS — E785 Hyperlipidemia, unspecified: Secondary | ICD-10-CM

## 2013-08-12 DIAGNOSIS — R000 Tachycardia, unspecified: Secondary | ICD-10-CM

## 2013-08-12 DIAGNOSIS — R002 Palpitations: Secondary | ICD-10-CM

## 2013-08-12 DIAGNOSIS — R030 Elevated blood-pressure reading, without diagnosis of hypertension: Secondary | ICD-10-CM

## 2013-08-12 DIAGNOSIS — F411 Generalized anxiety disorder: Secondary | ICD-10-CM

## 2013-08-12 DIAGNOSIS — Z0000 Encounter for general adult medical examination without abnormal findings: Secondary | ICD-10-CM

## 2013-08-12 LAB — T4, FREE: Free T4: 0.83 ng/dL (ref 0.80–1.80)

## 2013-08-12 LAB — TSH: TSH: 2.351 u[IU]/mL (ref 0.350–4.500)

## 2013-08-12 MED ORDER — ROSUVASTATIN CALCIUM 10 MG PO TABS: 10.0000 mg | ORAL_TABLET | Freq: Every day | ORAL | Status: DC

## 2013-08-12 NOTE — Patient Instructions (Signed)
Nonspecific Tachycardia Tachycardia is a faster than normal heartbeat (more than 100 beats per minute). In adults, the heart normally beats between 60 and 100 times a minute. A fast heartbeat may be a normal response to exercise or stress. It does not necessarily mean that something is wrong. However, sometimes when your heart beats too fast it may not be able to pump enough blood to the rest of your body. This can result in chest pain, shortness of breath, dizziness, and even fainting. Nonspecific tachycardia means that the specific cause or pattern of your tachycardia is unknown. CAUSES  Tachycardia may be harmless or it may be due to a more serious underlying cause. Possible causes of tachycardia include:  Exercise or exertion.  Fever.  Pain or injury.  Infection.  Loss of body fluids (dehydration).  Overactive thyroid.  Lack of red blood cells (anemia).  Anxiety and stress.  Alcohol.  Caffeine.  Tobacco products.  Diet pills.  Illegal drugs.  Heart disease. SYMPTOMS  Rapid or irregular heartbeat (palpitations).  Suddenly feeling your heart beating (cardiac awareness).  Dizziness.  Tiredness (fatigue).  Shortness of breath.  Chest pain.  Nausea.  Fainting. DIAGNOSIS  Your caregiver will perform a physical exam and take your medical history. In some cases, a heart specialist (cardiologist) may be consulted. Your caregiver may also order:  Blood tests.  Electrocardiography. This test records the electrical activity of your heart.  A heart monitoring test. TREATMENT  Treatment will depend on the likely cause of your tachycardia. The goal is to treat the underlying cause of your tachycardia. Treatment methods may include:  Replacement of fluids or blood through an intravenous (IV) tube for moderate to severe dehydration or anemia.  New medicines or changes in your current medicines.  Diet and lifestyle changes.  Treatment for certain  infections.  Stress relief or relaxation methods. HOME CARE INSTRUCTIONS   Rest.  Drink enough fluids to keep your urine clear or pale yellow.  Do not smoke.  Avoid:  Caffeine.  Tobacco.  Alcohol.  Chocolate.  Stimulants such as over-the-counter diet pills or pills that help you stay awake.  Situations that cause anxiety or stress.  Illegal drugs such as marijuana, phencyclidine (PCP), and cocaine.  Only take medicine as directed by your caregiver.  Keep all follow-up appointments as directed by your caregiver. SEEK IMMEDIATE MEDICAL CARE IF:   You have pain in your chest, upper arms, jaw, or neck.  You become weak, dizzy, or feel faint.  You have palpitations that will not go away.  You vomit, have diarrhea, or pass blood in your stool.  Your skin is cool, pale, and wet.  You have a fever that will not go away with rest, fluids, and medicine. MAKE SURE YOU:   Understand these instructions.  Will watch your condition.  Will get help right away if you are not doing well or get worse. Document Released: 07/14/2004 Document Revised: 08/29/2011 Document Reviewed: 05/17/2011 ExitCare Patient Information 2014 [employer].  

## 2013-08-12 NOTE — Progress Notes (Signed)
Pre visit review using our clinic review tool, if applicable. No additional management support is needed unless otherwise documented below in the visit note. 

## 2013-08-12 NOTE — Telephone Encounter (Signed)
Labs prior lipid, renal, cbc, tsh, hepatic   Patient will be going to [facility] lab

## 2013-08-12 NOTE — Telephone Encounter (Signed)
Lab order placed.

## 2013-08-15 ENCOUNTER — Ambulatory Visit: Payer: Managed Care, Other (non HMO) | Admitting: Internal Medicine

## 2013-08-18 ENCOUNTER — Encounter: Payer: Self-pay | Admitting: Family Medicine

## 2013-08-18 DIAGNOSIS — F411 Generalized anxiety disorder: Secondary | ICD-10-CM | POA: Insufficient documentation

## 2013-08-18 NOTE — Progress Notes (Signed)
Patient ID: Victoria [name], female   DOB: 29-Jun-1955, 58 y.o.   MRN: [id number] Victoria [name] [id number] 29-Jul-1955 08/18/2013      Progress Note-Follow Up  Subjective  Chief Complaint  Chief Complaint  Patient presents with  . Follow-up    from lab results- cholesterol  . Palpitations    HPI  Patient is a 58 year old Caucasian female in today for routine followup. She is having persistent palpitations although they are generally short-lived. She denies any chest pain at the same time. Does occasionally have a mild sense of shortness of breath. No GI or GU complaints chief she has not tried the Xanax during her episodes of palpitations as suggested. No worsening depression. Taking medications otherwise as prescribed. Continues to struggle with caring for her disabled child but he is doing better.  Past Medical History  Diagnosis Date  . Asthma   . Obesity   . Maxillary sinusitis   . Osteoarthritis   . IBS (irritable bowel syndrome)   . Hyperlipidemia   . COPD (chronic obstructive pulmonary disease)   . Pulmonary nodules   . Osteoporosis   . Nasal congestion 9/12    current cold  . Cough   . Wheezing   . GERD (gastroesophageal reflux disease)   . H/O hiatal hernia   . Depression     mild  . Allergic bronchopulmonary aspergillosis 2008    sees Dr Edmund Hilda pulmonology  . Anemia     iron deficiency, resolved  . Normal cardiac stress test 11/2011  . OSA (obstructive sleep apnea) 02/2012    has stopped using  cpap  . Anginal pain   . Chronic bronchitis   . Exertional dyspnea     "sometimes lying down; always w/exertion" (04/13/2012)  . Headache(784.0)     "often but not daily" (04/13/2012)  . Migraines   . Anxiety   . Pneumonia 11/2011    "before 2013 I hadn't had pneumonia since I was a child" (04/13/2012)  . Anxiety state, unspecified 08/18/2013    Past Surgical History  Procedure Laterality Date  . Cesarean section  1985  . Hernia repair  04/13/2012    VHR  laparoscopic  . Appendectomy  1989  . Ventral hernia repair  04/13/2012    Procedure: LAPAROSCOPIC VENTRAL HERNIA;  Surgeon: Adin Hector, MD;  Location: [facility];  Service: General;  Laterality: N/A;  laparoscopic repair of incarcerated hernia    Family History  Problem Relation Age of Onset  . Breast cancer Mother   . Prostate cancer Father   . Diverticulosis Father   . Cancer Father     prostate  . Pulmonary embolism Brother     recurrent  . Cancer Sister     breast    History   Social History  . Marital Status: Married    Spouse Name: N/A    Number of Children: 1  . Years of Education: N/A   Occupational History  .     Social History Main Topics  . Smoking status: Never Smoker   . Smokeless tobacco: Never Used  . Alcohol Use: 2.4 oz/week    4 Glasses of wine per week     Comment: 04/13/2012 "beer in the summer; wine 3-4 times/wk"  . Drug Use: No  . Sexual Activity: Yes     Comment: gluten free, lives with husband and son with CP quadriplegia   Other Topics Concern  . Not on file   Social History Narrative  . No  narrative on file    Current Outpatient Prescriptions on File Prior to Visit  Medication Sig Dispense Refill  . Acetylcysteine (NAC) 600 MG CAPS Take 1 capsule by mouth 2 (two) times daily.      Marland Kitchen albuterol (PROAIR HFA) 108 (90 BASE) MCG/ACT inhaler Inhale 2 puffs into the lungs every 6 (six) hours as needed. For shortness of breath      . ASMANEX 60 METERED DOSES 220 MCG/INH inhaler USE 2 INHALATIONS ORALLY EVERY EVENING  3 Inhaler  1  . aspirin 81 MG tablet Take 81 mg by mouth daily.       . Cholecalciferol (VITAMIN D) 2000 UNITS CAPS Take 2,000 Units by mouth.        . Cod Liver Oil OIL Take by mouth. 1 teaspoon daily      . fexofenadine (ALLEGRA) 180 MG tablet Take 180 mg by mouth daily.      Marland Kitchen ibuprofen (ADVIL,MOTRIN) 200 MG tablet Take 400 mg by mouth every 6 (six) hours as needed. For pain      . ipratropium-albuterol (DUONEB) 0.5-2.5 (3)  MG/3ML SOLN Take 32mL up to 4-6 times per day. prn      . montelukast (SINGULAIR) 10 MG tablet TAKE 1 TABLET BY MOUTH TWICE DAILY  180 tablet  3  . Omeprazole-Sodium Bicarbonate (ZEGERID) 20-1100 MG CAPS capsule TAKE 1 CAPSULE BY MOUTH EVERY MORNING  90 capsule  3  . OVER THE COUNTER MEDICATION Take 4 capsules by mouth daily. Shaklee Vitamins Contains calcium, magnesium, vitamin d, vitamin k, zinc, copper, mananese      . SPIRIVA HANDIHALER 18 MCG inhalation capsule INHALE CONTENTS OF 1 CAPSULE THROUGH HANDIHALER DEVICE EVERY DAY  90 capsule  0  . triamcinolone (NASACORT AQ) 55 MCG/ACT AERO nasal inhaler Place 2 sprays into the nose daily.      Marland Kitchen venlafaxine XR (EFFEXOR XR) 37.5 MG 24 hr capsule 3 capsules by mouth daily  270 capsule  3  . ALPRAZolam (XANAX) 0.5 MG tablet Take 1 tablet (0.5 mg total) by mouth 2 (two) times daily as needed for anxiety (Palpatations, SOB).  20 tablet  0   No current facility-administered medications on file prior to visit.    Allergies  Allergen Reactions  . Beclomethasone Dipropionate Hives and Other (See Comments)     weight gain  . Budesonide-Formoterol Fumarate Hives  . Sulfonamide Derivatives Hives and Rash  . Mometasone Furo-Formoterol Fum Hives and Other (See Comments)    weight gain  . Statins     Myalgias, RLS    Review of Systems  Review of Systems  Constitutional: Positive for malaise/fatigue. Negative for fever.  HENT: Negative for congestion.   Eyes: Negative for discharge.  Respiratory: Negative for shortness of breath.   Cardiovascular: Positive for palpitations. Negative for chest pain and leg swelling.  Gastrointestinal: Negative for nausea, abdominal pain and diarrhea.  Genitourinary: Negative for dysuria.  Musculoskeletal: Negative for falls.  Skin: Negative for rash.  Neurological: Negative for loss of consciousness and headaches.  Endo/Heme/Allergies: Negative for polydipsia.  Psychiatric/Behavioral: Negative for depression  and suicidal ideas. The patient is nervous/anxious. The patient does not have insomnia.     Objective  BP 110/80  Pulse 91  Temp(Src) 98.1 F (36.7 C) (Oral)  Ht 5\' 3"  (1.6 m)  Wt 164 lb 1.3 oz (74.426 kg)  BMI 29.07 kg/m2  SpO2 96%  Physical Exam  Physical Exam  Constitutional: She is oriented to person, place, and time and well-developed, well-nourished,  and in no distress. No distress.  HENT:  Head: Normocephalic and atraumatic.  Eyes: Conjunctivae are normal.  Neck: Neck supple. No thyromegaly present.  Cardiovascular: Normal rate, regular rhythm and normal heart sounds.   No murmur heard. Pulmonary/Chest: Effort normal and breath sounds normal. She has no wheezes.  Abdominal: She exhibits no distension and no mass.  Musculoskeletal: She exhibits no edema.  Lymphadenopathy:    She has no cervical adenopathy.  Neurological: She is alert and oriented to person, place, and time.  Skin: Skin is warm and dry. No rash noted. She is not diaphoretic.  Psychiatric: Memory, affect and judgment normal.    Lab Results  Component Value Date   TSH 2.351 08/12/2013   Lab Results  Component Value Date   WBC 10.3 07/27/2013   HGB 14.4 07/27/2013   HCT 42.7 07/27/2013   MCV 91.0 07/27/2013   PLT 249 07/27/2013   Lab Results  Component Value Date   CREATININE 0.67 07/27/2013   BUN 14 07/27/2013   NA 139 07/27/2013   K 3.5* 07/27/2013   CL 100 07/27/2013   CO2 24 07/27/2013   Lab Results  Component Value Date   ALT 30 11/02/2012   AST 23 11/02/2012   ALKPHOS 90 11/02/2012   BILITOT 0.3 11/02/2012   Lab Results  Component Value Date   CHOL 186 09/04/2012   Lab Results  Component Value Date   HDL 43.30 09/04/2012   Lab Results  Component Value Date   LDLCALC 125* 09/04/2012   Lab Results  Component Value Date   TRIG 90.0 09/04/2012   Lab Results  Component Value Date   CHOLHDL 4 09/04/2012     Assessment & Plan   Anxiety state, unspecified She is under a great deal of stress  caring for her disabled son. She is encouraged to try Alprazolam if anxiety or palpitations occur to see if it helps.   HYPERLIPIDEMIA Tolerating Crestor severaltimes a week continue the same and avoid trans fats  Obesity, unspecified Encouraged DASH diet and regular exercise  ASTHMA, PERSISTENT, SEVERE Improved with resolution of acute illness, no changes

## 2013-08-18 NOTE — Assessment & Plan Note (Signed)
Tolerating Crestor severaltimes a week continue the same and avoid trans fats

## 2013-08-18 NOTE — Assessment & Plan Note (Signed)
Improved with resolution of acute illness, no changes

## 2013-08-18 NOTE — Assessment & Plan Note (Signed)
She is under a great deal of stress caring for her disabled son. She is encouraged to try Alprazolam if anxiety or palpitations occur to see if it helps.

## 2013-08-18 NOTE — Assessment & Plan Note (Signed)
Encouraged DASH diet and regular exercise 

## 2013-08-19 ENCOUNTER — Other Ambulatory Visit: Payer: Self-pay | Admitting: Internal Medicine

## 2013-08-20 ENCOUNTER — Encounter: Payer: Self-pay | Admitting: Family Medicine

## 2013-08-27 ENCOUNTER — Encounter: Payer: Self-pay | Admitting: Internal Medicine

## 2013-08-28 ENCOUNTER — Encounter: Payer: Self-pay | Admitting: *Deleted

## 2013-08-29 ENCOUNTER — Ambulatory Visit (INDEPENDENT_AMBULATORY_CARE_PROVIDER_SITE_OTHER): Payer: Managed Care, Other (non HMO) | Admitting: Cardiology

## 2013-08-29 VITALS — BP 126/74 | HR 84 | Ht 63.0 in | Wt 166.0 lb

## 2013-08-29 DIAGNOSIS — R0989 Other specified symptoms and signs involving the circulatory and respiratory systems: Secondary | ICD-10-CM

## 2013-08-29 DIAGNOSIS — R079 Chest pain, unspecified: Secondary | ICD-10-CM

## 2013-08-29 DIAGNOSIS — R002 Palpitations: Secondary | ICD-10-CM

## 2013-08-29 NOTE — Progress Notes (Signed)
Patient ID: Louis Gaw, female   DOB: 1955/07/12, 58 y.o.   MRN: [id number]    Patient Name: Alycea Segoviano Date of Encounter: 08/29/2013  Primary Care Provider:  [name], MD Primary Cardiologist:  Dorothy Spark  Problem List   Past Medical History  Diagnosis Date  . Asthma   . Obesity   . Maxillary sinusitis   . Osteoarthritis   . IBS (irritable bowel syndrome)   . Hyperlipidemia   . COPD (chronic obstructive pulmonary disease)   . Pulmonary nodules   . Osteoporosis   . Nasal congestion 9/12    current cold  . Cough   . Wheezing   . GERD (gastroesophageal reflux disease)   . H/O hiatal hernia   . Depression     mild  . Allergic bronchopulmonary aspergillosis 2008    sees Dr Edmund Hilda pulmonology  . Anemia     iron deficiency, resolved  . Normal cardiac stress test 11/2011  . OSA (obstructive sleep apnea) 02/2012    has stopped using  cpap  . Anginal pain   . Chronic bronchitis   . Exertional dyspnea     "sometimes lying down; always w/exertion" (04/13/2012)  . Headache(784.0)     "often but not daily" (04/13/2012)  . Migraines   . Anxiety   . Pneumonia 11/2011    "before 2013 I hadn't had pneumonia since I was a child" (04/13/2012)  . Anxiety state, unspecified 08/18/2013   Past Surgical History  Procedure Laterality Date  . Cesarean section  1985  . Hernia repair  04/13/2012    VHR laparoscopic  . Appendectomy  1989  . Ventral hernia repair  04/13/2012    Procedure: LAPAROSCOPIC VENTRAL HERNIA;  Surgeon: Adin Hector, MD;  Location: [facility];  Service: General;  Laterality: N/A;  laparoscopic repair of incarcerated hernia    Allergies  Allergies  Allergen Reactions  . Beclomethasone Dipropionate Hives and Other (See Comments)     weight gain  . Budesonide-Formoterol Fumarate Hives  . Sulfonamide Derivatives Hives and Rash  . Mometasone Furo-Formoterol Fum Hives and Other (See Comments)    weight gain  . Statins     Myalgias, RLS     HPI  58 year old female with chronic persistent asthma, hyperlipidemia, obesity who is coming with complains of chest pain and palpitations. Her chest pain is retrosternal, pressure like and associated with SOB. It can be exertional and non-exertional. It is usually associated with significant diaphoresis. The patient called EMS on 2 occasions and ACS was ruled out.  Two years ago she underwent a nuclear stress test that was negative for ischemia or infarct. LVEF was 72%. She continues having significant chest pains.   She also experiences palpitations, they come in clusters. They can occur daily for 6 weeks and than disappear for few weeks. They atsrt all sudden and can last minutes. There is associated mild dizziness.  No syncope. She has been on and off steroids her entire life.  Home Medications  Prior to Admission medications   Medication Sig Start Date End Date Taking? Authorizing Provider  Acetylcysteine (NAC) 600 MG CAPS Take 1 capsule by mouth 2 (two) times daily.    Historical Provider, MD  albuterol (PROAIR HFA) 108 (90 BASE) MCG/ACT inhaler Inhale 2 puffs into the lungs every 6 (six) hours as needed. For shortness of breath    Historical Provider, MD  ALPRAZolam (XANAX) 0.5 MG tablet Take 1 tablet (0.5 mg total) by mouth 2 (  two) times daily as needed for anxiety (Palpatations, SOB). 08/09/13   Mosie Lukes, MD  ASMANEX 60 METERED DOSES 220 MCG/INH inhaler USE 2 INHALATIONS ORALLY EVERY EVENING 12/28/12   Brand Males, MD  aspirin 81 MG tablet Take 81 mg by mouth daily.     Historical Provider, MD  Cholecalciferol (VITAMIN D) 2000 UNITS CAPS Take 2,000 Units by mouth.      Historical Provider, MD  [facility] Liver Oil OIL Take by mouth. 1 teaspoon daily    Historical Provider, MD  fexofenadine (ALLEGRA) 180 MG tablet Take 180 mg by mouth daily.    Historical Provider, MD  ibuprofen (ADVIL,MOTRIN) 200 MG tablet Take 400 mg by mouth every 6 (six) hours as needed. For pain     Historical Provider, MD  ipratropium-albuterol (DUONEB) 0.5-2.5 (3) MG/3ML SOLN Take 22mL up to 4-6 times per day. prn 10/09/12   Brand Males, MD  montelukast (SINGULAIR) 10 MG tablet TAKE 1 TABLET BY MOUTH TWICE DAILY 04/09/13   Brand Males, MD  Omeprazole-Sodium Bicarbonate (ZEGERID) 20-1100 MG CAPS capsule TAKE 1 CAPSULE BY MOUTH EVERY MORNING 04/09/13   Brand Males, MD  OVER THE COUNTER MEDICATION Take 4 capsules by mouth daily. Shaklee Vitamins Contains calcium, magnesium, vitamin d, vitamin k, zinc, copper, mananese    Historical Provider, MD  rosuvastatin (CRESTOR) 10 MG tablet Take 1 tablet (10 mg total) by mouth daily. 1 tab po M, W, F 08/12/13   Mosie Lukes, MD  SPIRIVA HANDIHALER 18 MCG inhalation capsule INHALE CONTENTS OF 1 [place], MD  triamcinolone (NASACORT AQ) 55 MCG/ACT AERO nasal inhaler Place 2 sprays into the nose daily.    Historical Provider, MD  venlafaxine XR (EFFEXOR XR) 37.5 MG 24 hr capsule 3 capsules by mouth daily 05/24/13   Rosalita Chessman, DO    Family History  Family History  Problem Relation Age of Onset  . Breast cancer Mother   . Prostate cancer Father   . Diverticulosis Father   . Prostate cancer Father     prostate  . Pulmonary embolism Brother     recurrent  . Breast cancer Sister     breast    Social History  History   Social History  . Marital Status: Married    Spouse Name: N/A    Number of Children: 1  . Years of Education: N/A   Occupational History  .     Social History Main Topics  . Smoking status: Never Smoker   . Smokeless tobacco: Never Used  . Alcohol Use: 2.4 oz/week    4 Glasses of wine per week     Comment: 04/13/2012 "beer in the summer; wine 3-4 times/wk"  . Drug Use: No  . Sexual Activity: Yes     Comment: gluten free, lives with husband and son with CP quadriplegia   Other Topics Concern  . Not on file   Social History Narrative  . No  narrative on file     Review of Systems, as per HPI, otherwise negative General:  No chills, fever, night sweats or weight changes.  Cardiovascular:  No chest pain, dyspnea on exertion, edema, orthopnea, palpitations, paroxysmal nocturnal dyspnea. Dermatological: No rash, lesions/masses Respiratory: No cough, dyspnea Urologic: No hematuria, dysuria Abdominal:   No nausea, vomiting, diarrhea, bright red blood per rectum, melena, or hematemesis Neurologic:  No visual changes, wkns, changes in mental status. All other systems reviewed and are otherwise  negative except as noted above.  Physical Exam  Blood pressure 126/74, pulse 84, height 5\' 3"  (1.6 m), weight 166 lb (75.297 kg).  General: Pleasant, NAD Psych: Normal affect. Neuro: Alert and oriented X 3. Moves all extremities spontaneously. HEENT: Normal  Neck: Supple without bruits or JVD. Lungs:  Resp regular and unlabored, CTA. Heart: RRR no s3, s4, or murmurs. Abdomen: Soft, non-tender, non-distended, BS + x 4.  Extremities: No clubbing, cyanosis or edema. DP/PT/Radials 2+ and equal bilaterally.  Labs:  No results found for this basename: CKTOTAL, CKMB, TROPONINI,  in the last 72 hours Lab Results  Component Value Date   WBC 10.3 07/27/2013   HGB 14.4 07/27/2013   HCT 42.7 07/27/2013   MCV 91.0 07/27/2013   PLT 249 07/27/2013   No results found for this basename: NA, K, CL, CO2, BUN, CREATININE, CALCIUM, LABALBU, PROT, BILITOT, ALKPHOS, ALT, AST, GLUCOSE,  in the last 168 hours Lab Results  Component Value Date   CHOL 186 09/04/2012   HDL 43.30 09/04/2012   LDLCALC 125* 09/04/2012   TRIG 90.0 09/04/2012   Lab Results  Component Value Date   DDIMER 1.54* 12/13/2011   No components found with this basename: POCBNP,   Accessory Clinical Findings  Echocardiogram - none  ECG - SR, normal ECG   Assessment & Plan  58 year old female   1, Chest pain - risk factors include hyperlipidemia, obesity and chronic steroid use.  Because her stress test was negative in the past and she is currently limited with use of Lexiscan (asthma) and exercise (deconditioning) we will order coronary CT to rule out any CAD, Prinzmetal angina or bridging.  2. Palpitations - we will oredr 48 hour Holter monitor.   Follow up in 1 month.    Dorothy Spark, MD, [facility] 08/29/2013, 12:01 PM

## 2013-08-29 NOTE — Patient Instructions (Addendum)
.  Your physician has recommended that you wear a holter monitor. Holter monitors are medical devices that record the heart's electrical activity. Doctors most often use these monitors to diagnose arrhythmias. Arrhythmias are problems with the speed or rhythm of the heartbeat. The monitor is a small, portable device. You can wear one while you do your normal daily activities. This is usually used to diagnose what is causing palpitations/syncope (passing out).  Your physician has requested that you have cardiac CT. Cardiac computed tomography (CT) is a painless test that uses an x-ray machine to take clear, detailed pictures of your heart. For further information please visit [employer]uge[employer]iest[employer]tn. Please follow instruction sheet as given.   Your physician recommends that you schedule a follow-up appointment in: 1 month with Dr. Meda Coffee.

## 2013-08-30 DIAGNOSIS — R079 Chest pain, unspecified: Secondary | ICD-10-CM | POA: Insufficient documentation

## 2013-09-04 ENCOUNTER — Ambulatory Visit: Payer: Managed Care, Other (non HMO) | Admitting: Internal Medicine

## 2013-09-16 ENCOUNTER — Other Ambulatory Visit: Payer: Self-pay

## 2013-09-16 DIAGNOSIS — R079 Chest pain, unspecified: Secondary | ICD-10-CM

## 2013-09-17 ENCOUNTER — Telehealth: Payer: Self-pay | Admitting: Cardiology

## 2013-09-17 NOTE — Telephone Encounter (Signed)
Scheduled stress echo for 10/02/2013 when I called pt to let her know day and time she advised me she did not want to have the test at this time, she is not having any symptoms, pt stated if they start back up she will give [name] a call, I told her I would let Dr. Meda Coffee know.

## 2013-09-18 NOTE — Telephone Encounter (Signed)
Called pt she said she was not having any problems at this time and will call if they start back

## 2013-09-18 NOTE — Telephone Encounter (Signed)
I think I ordered a cardiac CT, but insurance wouldn't pay. If she is asymptomatic it is ok not having it done right now. K

## 2013-09-20 ENCOUNTER — Telehealth: Payer: Self-pay

## 2013-09-20 NOTE — Telephone Encounter (Signed)
Noted. Will forward message to Dr Meda Coffee as Juluis Rainier.

## 2013-09-20 NOTE — Telephone Encounter (Signed)
Message copied by VIA, Deliah Boston on Fri Sep 20, 2013  7:37 AM ------      Message from: Vashti Hey D      Created: Thu Aug 29, 2013  1:03 PM      Regarding: MONITOR       08/29/13 Per the patient will call to schedule when she is have problem. ------

## 2013-09-30 ENCOUNTER — Telehealth: Payer: Self-pay | Admitting: Internal Medicine

## 2013-09-30 MED ORDER — PREDNISONE 10 MG PO TABS: ORAL_TABLET | ORAL | Status: DC

## 2013-09-30 NOTE — Telephone Encounter (Signed)
C/o PND, nasal congestion, chest tx, barky dry cough, wheezing, increase SOB x Friday. She is doing 2 neb tx's daily. Using nasacort and allegra. Pt requesting something to be called in. Please advise MR thanks  Allergies  Allergen Reactions  . Beclomethasone Dipropionate Hives and Other (See Comments)     weight gain  . Budesonide-Formoterol Fumarate Hives  . Sulfonamide Derivatives Hives and Rash  . Mometasone Furo-Formoterol Fum Hives and Other (See Comments)    weight gain  . Statins     Myalgias, RLS

## 2013-09-30 NOTE — Telephone Encounter (Signed)
Pt is aware of MR's recs. Prednisone has been sent in. Nothing further is needed.

## 2013-09-30 NOTE — Telephone Encounter (Signed)
Likely allergy related AE-asthma   Do Take prednisone 40 mg daily x 2 days, then 20mg  daily x 2 days, then 10mg  daily x 2 days, then 5mg  daily x 2 days and  Then back to baseline regimen which she know   Hold off abx for now  Dr. Brand Males, M.D., [facility].C.P Pulmonary and Critical Care Medicine Staff Physician [facility] Pager: [phone], If no answer or between  15:00h - 7:00h: call [phone]  09/30/2013 9:15 AM

## 2013-10-02 ENCOUNTER — Ambulatory Visit: Payer: Managed Care, Other (non HMO) | Admitting: Physician Assistant

## 2013-10-02 ENCOUNTER — Other Ambulatory Visit (HOSPITAL_COMMUNITY): Payer: Managed Care, Other (non HMO)

## 2013-10-10 ENCOUNTER — Telehealth: Payer: Self-pay | Admitting: Internal Medicine

## 2013-10-10 NOTE — Telephone Encounter (Signed)
Error.Victoria [name] ° °

## 2013-10-11 ENCOUNTER — Encounter: Payer: Self-pay | Admitting: Adult Health

## 2013-10-11 ENCOUNTER — Ambulatory Visit (INDEPENDENT_AMBULATORY_CARE_PROVIDER_SITE_OTHER): Payer: Managed Care, Other (non HMO) | Admitting: Adult Health

## 2013-10-11 VITALS — BP 114/80 | HR 86 | Temp 98.1°F | Ht 63.0 in | Wt 165.8 lb

## 2013-10-11 DIAGNOSIS — J45909 Unspecified asthma, uncomplicated: Secondary | ICD-10-CM

## 2013-10-11 MED ORDER — PREDNISONE 10 MG PO TABS: ORAL_TABLET | ORAL | Status: DC

## 2013-10-11 NOTE — Patient Instructions (Addendum)
Prednisone taper : 40mg  daily x 5 d , 30mg  daily x 5 d, 20mg  daily x 5 d , and then hold at 10mg  daily until seen back in office  Mucinex DM Twice daily  As needed  Cough/congestion  Continue Asmanex 2 puffs daily  , rinse after use.  Follow up Dr. Chase Caller in 3 weeks and As needed   Please contact office for sooner follow up if symptoms do not improve or worsen or seek emergency care

## 2013-10-14 ENCOUNTER — Ambulatory Visit: Payer: Managed Care, Other (non HMO) | Admitting: Family Medicine

## 2013-10-14 ENCOUNTER — Ambulatory Visit: Payer: Managed Care, Other (non HMO) | Admitting: Internal Medicine

## 2013-10-14 NOTE — Assessment & Plan Note (Addendum)
Slow to resolve flare w/ underlying known ABPA  Does not appear to be infectious in nature    Plan  Prednisone taper : 40mg  daily x 5 d , 30mg  daily x 5 d, 20mg  daily x 5 d , and then hold at 10mg  daily until seen back in office  Mucinex DM Twice daily  As needed  Cough/congestion  Continue Asmanex 2 puffs daily  , rinse after use.  Follow up Dr. Chase Caller in 3 weeks and As needed   Please contact office for sooner follow up if symptoms do not improve or worsen or seek emergency care

## 2013-10-14 NOTE — Progress Notes (Signed)
Subjective:    Patient ID: Victoria [name], female    DOB: 13-Nov-1955, 58 y.o.   MRN: [id number]  HPI 58 y.o. female with mild OSA, severe persistent asthma and abpa .   06/03/2013 >Night prior to ov with choking episode eating small pieces of almonds>>CXR with R basilar infitrate, tx for PNA w/ Avelox    06/28/2013 Acute OV  Pt c/o sinus congestion, fatigue, runny nose, sneezing, wheezing, increased SOB, prod cough with yellow/beige mucous X8 days. Pt states she is having a lot of bloody nasal drainage. Took Airborne.  Husband similar symptoms  No fever or body aches.  Sinus pressure. No known reflux or n/v.  Was called in Tamiflu, prednisone taper and Zpack on 06/25/12 .  Congestion is slightly better but still has significant cough and wheezing.  >>  augmentin + pred CXR 12/22 no consolidation  She is back with her son today due to persistent cough and wheezing CXR  - LLL plate like atx,Diaphragmatic hernia posteriorly on the left unchanged  >>augmentin rx   10/11/13 Acute OV  Complains of 2 weeks cough , and wheezing .  Pt was called in pred taper 4/13 , says it helped  But as soon as she finished she had symptoms again w/ cough and wheezing. No discolored mucus or fever. No orthopnea or edema.   Pt states having to use neb tx more often. Denies CP.  Recent cxr in 07/2013 w/ chronic changes. No acute process noted.  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 Going out of town  Next week.  Remains on Asmanex and Spiriva daily .     Review of Systems  neg for any significant sore throat, dysphagia, itching, sneezing, nasal congestion or excess/ purulent secretions, fever, chills, sweats, unintended wt loss, pleuritic or exertional cp, hempoptysis, orthopnea pnd or change in chronic leg swelling. Also denies presyncope, palpitations, heartburn, abdominal pain, nausea, vomiting, diarrhea or change in bowel or urinary habits, dysuria,hematuria, rash, arthralgias, visual complaints, headache, numbness weakness  or ataxia.   Past Medical History  Diagnosis Date  . Asthma   . Obesity   . Maxillary sinusitis   . Osteoarthritis   . IBS (irritable bowel syndrome)   . Hyperlipidemia   . COPD (chronic obstructive pulmonary disease)   . Pulmonary nodules   . Osteoporosis   . Nasal congestion 9/12    current cold  . Cough   . Wheezing   . GERD (gastroesophageal reflux disease)   . H/O hiatal hernia   . Depression     mild  . Allergic bronchopulmonary aspergillosis 2008    sees Dr Edmund Hilda pulmonology  . Anemia     iron deficiency, resolved  . Normal cardiac stress test 11/2011  . OSA (obstructive sleep apnea) 02/2012    has stopped using  cpap  . Anginal pain   . Chronic bronchitis   . Exertional dyspnea     "sometimes lying down; always w/exertion" (04/13/2012)  . Headache(784.0)     "often but not daily" (04/13/2012)  . Migraines   . Anxiety   . Pneumonia 11/2011    "before 2013 I hadn't had pneumonia since I was a child" (04/13/2012)  . Anxiety state, unspecified 08/18/2013       Objective:   Physical Exam   Gen. Pleasant, well-nourished, in no distress, normal affect ENT - no lesions, no post nasal drip Neck: No JVD, no thyromegaly, no carotid bruits Lungs: no use of accessory muscles, no dullness to percussion, faint  exp wheeze  Cardiovascular: Rhythm regular, heart sounds  normal, no murmurs or gallops, no peripheral edema Abdomen: soft and non-tender, no hepatosplenomegaly, BS normal. Musculoskeletal: No deformities, no cyanosis or clubbing Neuro:  alert, non focal  CXR 07/27/13 Areas of central peribronchial thickening consistent with chronic  bronchitis. Scarring in right upper lobe, stable. There is also  apical pleural thickening bilaterally, stable. There is no edema or  consolidation      Assessment & Plan:

## 2013-10-22 ENCOUNTER — Telehealth: Payer: Self-pay

## 2013-10-22 DIAGNOSIS — E785 Hyperlipidemia, unspecified: Secondary | ICD-10-CM

## 2013-10-22 MED ORDER — OMEPRAZOLE-SODIUM BICARBONATE 20-1100 MG PO CAPS: 1.0000 | ORAL_CAPSULE | Freq: Every day | ORAL | Status: DC

## 2013-10-22 MED ORDER — ROSUVASTATIN CALCIUM 10 MG PO TABS: 10.0000 mg | ORAL_TABLET | Freq: Every day | ORAL | Status: DC

## 2013-10-22 MED ORDER — VENLAFAXINE HCL ER 37.5 MG PO CP24: ORAL_CAPSULE | ORAL | Status: DC

## 2013-10-22 NOTE — Telephone Encounter (Signed)
Patient mailed in a paper stating that she needs an insurance paper filled out and and Omeprazole, crestor and venlafaxine sent to her and she will mail them to the mail order (pt didn't state what pharmacy).  I will do both (the insurance paper and mail RX's)

## 2013-11-06 ENCOUNTER — Encounter: Payer: Self-pay | Admitting: Internal Medicine

## 2013-11-06 ENCOUNTER — Ambulatory Visit (INDEPENDENT_AMBULATORY_CARE_PROVIDER_SITE_OTHER): Payer: Managed Care, Other (non HMO) | Admitting: Internal Medicine

## 2013-11-06 VITALS — BP 112/90 | HR 78 | Ht 63.0 in | Wt 167.0 lb

## 2013-11-06 DIAGNOSIS — J45909 Unspecified asthma, uncomplicated: Secondary | ICD-10-CM

## 2013-11-06 DIAGNOSIS — B4481 Allergic bronchopulmonary aspergillosis: Secondary | ICD-10-CM

## 2013-11-06 MED ORDER — TIOTROPIUM BROMIDE MONOHYDRATE 18 MCG IN CAPS: ORAL_CAPSULE | RESPIRATORY_TRACT | Status: DC

## 2013-11-06 MED ORDER — MONTELUKAST SODIUM 10 MG PO TABS: ORAL_TABLET | ORAL | Status: DC

## 2013-11-06 MED ORDER — MOMETASONE FUROATE 220 MCG/INH IN AEPB: INHALATION_SPRAY | RESPIRATORY_TRACT | Status: DC

## 2013-11-06 MED ORDER — PREDNISONE 1 MG PO TABS: ORAL_TABLET | ORAL | Status: DC

## 2013-11-06 NOTE — Progress Notes (Signed)
   Subjective:    Patient ID: Victoria [name], female    DOB: [birth year], 58 y.o.   MRN: [id number]  HPI   Severe persistent asthma + ABPA: maintained on asmanex, singulair, spiriva, NAC, prilosec, prednisone 5mg /day scheduled + prn duoneb. Has visited [facility] i past for 2nd opinion. Has tried itraconazole in past; not inclined to try again. At risk for recurrent AE-asthma/ABPA; has peripheral eosinophilia   OV 11/06/2013  Chief Complaint  Patient presents with  . Follow-up    Pt states breathing has improved since last OV. C/o intermittent mild dry cough. Denies SOB and CP. Pt states overall she is doing well.     Currently well. But reports since Jan 2015 recurrent AE-asthma/abpa needing multiple increases in prednisone. States that higher prednisone causes tachycardia. There was holter ordered in march 2015 but not sure she had it. She declined cardiac stress test. SHe thinks winter and pollen this 2015 season have been hard on her. Wondering about alternatives to prednisone during flare up because of perceived tachycardia with higher doses of prednisone. Also, she is inclined to reduce her daily prednisone dose if possible. She is frustrated by her chronic health but at this time not inclined to re-visit [facility]. She is open to research trials but we do not have any for asthma. No new issus    Review of Systems  Constitutional: Negative for fever and unexpected weight change.  HENT: Negative for congestion, dental problem, ear pain, nosebleeds, postnasal drip, rhinorrhea, sinus pressure, sneezing, sore throat and trouble swallowing.   Eyes: Negative for redness and itching.  Respiratory: Positive for cough and shortness of breath. Negative for chest tightness and wheezing.   Cardiovascular: Negative for palpitations and leg swelling.  Gastrointestinal: Negative for nausea and vomiting.  Genitourinary: Negative for dysuria.  Musculoskeletal: Negative for joint swelling.  Skin: Negative for  rash.  Neurological: Negative for headaches.  Hematological: Does not bruise/bleed easily.  Psychiatric/Behavioral: Negative for dysphoric mood. The patient is not nervous/anxious.        Objective:   Physical Exam   Filed Vitals:   11/06/13 1143  BP: 112/90  Pulse: 78  Height: 5\' 3"  (1.6 m)  Weight: 167 lb (75.751 kg)  SpO2: 97%    Gen. Pleasant, well-nourished, in no distress, normal affect. Cushingoid ENT - no lesions, no post nasal drip Neck: No JVD, no thyromegaly, no carotid bruits Lungs: no use of accessory muscles, no dullness to percussion, faint exp wheeze  Cardiovascular: Rhythm regular, heart sounds  normal, no murmurs or gallops, no peripheral edema Abdomen: soft and non-tender, no hepatosplenomegaly, BS normal. Musculoskeletal: No deformities, no cyanosis or clubbing Neuro:  alert, non focal       Assessment & Plan:  #Asthma + ABPA  - glad you are some better  - refills for meds done  - continue all of them - reduce daily prednisone from 5mg  to 3mg  a day - in future can try medrol for flare ups due to tachycardia with prednisone  #Followup   - 3 months  - spirometry at followup

## 2013-11-06 NOTE — Patient Instructions (Addendum)
#  Asthma + ABPA  - glad you are some better  - refills for meds done  - continue all of them - reduce daily prednisone from 5mg to 3mg a day - in future can try medrol for flare ups due to tachycardia with prednisone  #Followup   - 3 months  - spirometry at followup 

## 2013-11-08 ENCOUNTER — Telehealth: Payer: Self-pay

## 2013-11-08 NOTE — Telephone Encounter (Signed)
[employer] calling to verify Crestor, there were two directions on the Rx. I verified Crestor 10 mg one tablet daily. I called pt, she states she takes the medication on Mon, Wed, Fri. I advised her to take it how her and Dr. Charlett Blake discussed. Pt agreed.

## 2013-11-11 NOTE — Assessment & Plan Note (Signed)
#  Asthma + ABPA  - glad you are some better  - refills for meds done  - continue all of them - reduce daily prednisone from 5mg  to 3mg  a day - in future can try medrol for flare ups due to tachycardia with prednisone  #Followup   - 3 months  - spirometry at followup

## 2013-11-18 ENCOUNTER — Telehealth: Payer: Self-pay | Admitting: Internal Medicine

## 2013-11-18 NOTE — Telephone Encounter (Signed)
Looks like RX is to be taken 1 tab PO BID. MR has been filling this for her since 2012.  ATC # listed above and no closed [facility]

## 2013-11-19 NOTE — Telephone Encounter (Signed)
I called the # listed and it was for a [place]. The correct # is [phone]. Called spoke with Baker Janus a pharm. Gave VO regarding pt RX. Nothing further needed

## 2013-12-09 ENCOUNTER — Ambulatory Visit: Payer: Managed Care, Other (non HMO) | Admitting: Family Medicine

## 2013-12-31 ENCOUNTER — Telehealth: Payer: Self-pay | Admitting: Internal Medicine

## 2013-12-31 ENCOUNTER — Ambulatory Visit (INDEPENDENT_AMBULATORY_CARE_PROVIDER_SITE_OTHER): Payer: Managed Care, Other (non HMO) | Admitting: Internal Medicine

## 2013-12-31 ENCOUNTER — Encounter: Payer: Self-pay | Admitting: Internal Medicine

## 2013-12-31 VITALS — BP 102/70 | HR 96 | Temp 99.6°F | Ht 63.0 in | Wt 163.6 lb

## 2013-12-31 DIAGNOSIS — J45909 Unspecified asthma, uncomplicated: Secondary | ICD-10-CM

## 2013-12-31 MED ORDER — AZITHROMYCIN 250 MG PO TABS: ORAL_TABLET | ORAL | Status: DC

## 2013-12-31 MED ORDER — PREDNISONE 10 MG PO TABS: ORAL_TABLET | ORAL | Status: DC

## 2013-12-31 NOTE — Patient Instructions (Addendum)
Prednisone 10 mg take  4 each am x 2 days,   2 each am x 2 days,  1 each am x 2 days and stop   zpak   When start coughing double zegrid take it before bfast and supper   GERD (REFLUX)  is an extremely common cause of respiratory symptoms, many times with no significant heartburn at all.    It can be treated with medication, but also with lifestyle changes including avoidance of late meals, excessive alcohol, smoking cessation, and avoid fatty foods, chocolate, peppermint, colas, red wine, and acidic juices such as orange juice.  NO MINT OR MENTHOL PRODUCTS SO NO COUGH DROPS  USE SUGARLESS CANDY INSTEAD (jolley ranchers or Stover's)  NO OIL BASED VITAMINS - use powdered substitutes.    Keep appt to see [name] in August - call sooner if needed

## 2013-12-31 NOTE — Telephone Encounter (Signed)
Called spoke w/ pt. appt scheduled for her to come in and see MW this afternoon. Nothing further needed

## 2013-12-31 NOTE — Progress Notes (Signed)
Subjective:    Patient ID: Victoria [name], female    DOB: [birth year], 58 y.o.   MRN: [id number]  HPI   Severe persistent asthma + ABPA: maintained on asmanex, singulair, spiriva, NAC, prilosec, prednisone 5mg /day scheduled + prn duoneb. Has visited [facility] i past for 2nd opinion. Has tried itraconazole in past; not inclined to try again. At risk for recurrent AE-asthma/ABPA; has peripheral eosinophilia   OV 11/06/2013  Chief Complaint  Patient presents with  . Follow-up    Pt states breathing has improved since last OV. C/o intermittent mild dry cough. Denies SOB and CP. Pt states overall she is doing well.     Currently well. But reports since Jan 2015 recurrent AE-asthma/abpa needing multiple increases in prednisone. States that higher prednisone causes tachycardia. There was holter ordered in march 2015 but not sure she had it. She declined cardiac stress test. SHe thinks winter and pollen this 2015 season have been hard on her. Wondering about alternatives to prednisone during flare up because of perceived tachycardia with higher doses of prednisone. Also, she is inclined to reduce her daily prednisone dose if possible. She is frustrated by her chronic health but at this time not inclined to re-visit [facility]. She is open to research trials but we do not have any for asthma.  rec #Asthma + ABPA  - glad you are some better  - refills for meds done  - continue all of them - reduce daily prednisone from 5mg  to 3mg  a day - in future can try medrol for flare ups due to tachycardia with prednisone  #Followup   - 3 months  - spirometry at followup    12/31/2013 f/u ov/Victoria [name] re: maint on spiriva/ asthmanex due to ? HFA allergy? Chief Complaint  Patient presents with  . Acute Visit    MR pt. Pt c/o sinus pressure, prod cough with yellow and biege mucous, DOE and chest tightness intermittently throughout day. Pt states she is doing neb tx every 3-4 hours. Pt states she took 40mg  of pred last  night and this morning and has been taking mucinex.   started using neb x 3-4 days usually don't need it al all maint 3 mg deltasone,  increasded to  40 prednisone one day prior to OV  And on day of ov Last neb 1.5 h  prior to OV    No obvious day to day or daytime variabilty or assoc cp or  overt sinus or hb symptoms. No unusual exp hx or h/o childhood pna/ asthma or knowledge of premature birth.  Sleeping ok without nocturnal  or early am exacerbation  of respiratory  c/o's or need for noct saba. Also denies any obvious fluctuation of symptoms with weather or environmental changes or other aggravating or alleviating factors except as outlined above   Current Medications, Allergies, Complete Past Medical History, Past Surgical History, Family History, and Social History were reviewed in [employer] record.  ROS  The following are not active complaints unless bolded sore throat, dysphagia, dental problems, itching, sneezing,  nasal congestion or excess/ purulent secretions, ear ache,   fever, chills, sweats, unintended wt loss, pleuritic or exertional cp, hemoptysis,  orthopnea pnd or leg swelling, presyncope, palpitations, heartburn, abdominal pain, anorexia, nausea, vomiting, diarrhea  or change in bowel or urinary habits, change in stools or urine, dysuria,hematuria,  rash, arthralgias, visual complaints, headache, numbness weakness or ataxia or problems with walking or coordination,  change in mood/affect or memory.  Objective:   Physical Exam   Wt Readings from Last 3 Encounters:  12/31/13 163 lb 9.6 oz (74.208 kg)  11/06/13 167 lb (75.751 kg)  10/11/13 165 lb 12.8 oz (75.206 kg)       Gen. Pleasant, well-nourished, in no distress, normal affect. Cushingoid ENT - no lesions, no post nasal drip Neck: No JVD, no thyromegaly, no carotid bruits Lungs: no use of accessory muscles, no dullness to percussion, faint late exp bilateral wheeze    Cardiovascular: Rhythm regular, heart sounds  normal, no murmurs or gallops, no peripheral edema Abdomen: soft and non-tender, no hepatosplenomegaly, BS normal. Musculoskeletal: No deformities, no cyanosis or clubbing Neuro:  alert, non focal       Assessment & Plan:

## 2014-01-01 NOTE — Assessment & Plan Note (Signed)
DDX of  difficult airways management all start with A and  include Adherence, Ace Inhibitors, Acid Reflux, Active Sinus Disease, Alpha 1 Antitripsin deficiency, Anxiety masquerading as Airways dz,  ABPA,  allergy(esp in young), Aspiration (esp in elderly), Adverse effects of DPI,  Active smokers, plus two Bs  = Bronchiectasis and Beta blocker use..and one C= CHF  Adherence is always the initial "prime suspect" and is a multilayered concern that requires a "trust but verify" approach in every patient - starting with knowing how to use medications, especially inhalers, correctly, keeping up with refills and understanding the fundamental difference between maintenance and prns vs those medications only taken for a very short course and then stopped and not refilled.  - can't use hfa ? Needs ICS/LABA per dpi or  neb instead?   ? Allergy flare > Prednisone 10 mg take  4 each am x 2 days,   2 each am x 2 days,  1 each am x 2 days then resume prev baseline  ? Acid (or non-acid) GERD > always difficult to exclude as up to 75% of pts in some series report no assoc GI/ Heartburn symptoms> rec max (24h)  acid suppression and diet restrictions/ reviewed and instructions given in writing.

## 2014-01-02 ENCOUNTER — Other Ambulatory Visit: Payer: Self-pay | Admitting: Internal Medicine

## 2014-01-07 ENCOUNTER — Other Ambulatory Visit: Payer: Self-pay | Admitting: Adult Health

## 2014-01-08 ENCOUNTER — Encounter: Payer: Self-pay | Admitting: Pulmonary Disease

## 2014-01-08 ENCOUNTER — Ambulatory Visit (INDEPENDENT_AMBULATORY_CARE_PROVIDER_SITE_OTHER): Payer: Managed Care, Other (non HMO) | Admitting: Pulmonary Disease

## 2014-01-08 VITALS — BP 118/80 | HR 89 | Temp 98.6°F | Ht 63.0 in | Wt 163.0 lb

## 2014-01-08 DIAGNOSIS — J441 Chronic obstructive pulmonary disease with (acute) exacerbation: Secondary | ICD-10-CM

## 2014-01-08 MED ORDER — PREDNISONE (PAK) 10 MG PO TABS: ORAL_TABLET | ORAL | Status: DC

## 2014-01-08 NOTE — Progress Notes (Signed)
   Subjective:    Patient ID: Victoria [name], female    DOB: [birth year], 58 y.o.   MRN: [id number]  HPI  Severe persistent asthma + ABPA: maintained on asmanex, singulair, spiriva, NAC, prilosec, prednisone 3mg /day scheduled + prn duoneb. Has visited [facility] i past for 2nd opinion. Has tried itraconazole in past; not inclined to try again. At risk for recurrent AE-asthma/ABPA; has peripheral eosinophilia     01/08/2014  Chief Complaint  Patient presents with  . Acute Visit    Cough with green / gray mucus, wheezing and tightness going on 2 weeks.  pt also completed pred taper and zpak x1 week ago after seeing MW   She has been sick for 2 weeks now, this started with a URI affecting her and her son (with cerebral palsy, who is also my patient) She completed Z-Pak and a short prednisone course but is back to coughing now. She reports that she always needs an extended prednisone taper. She denies systemic symptoms of fever, myalgia, weight loss or loss of appetite. Cough disturbs her sleep, sputum has now turned gray.  Past Medical History  Diagnosis Date  . Asthma   . Obesity   . Maxillary sinusitis   . Osteoarthritis   . IBS (irritable bowel syndrome)   . Hyperlipidemia   . COPD (chronic obstructive pulmonary disease)   . Pulmonary nodules   . Osteoporosis   . Nasal congestion 9/12    current cold  . Cough   . Wheezing   . GERD (gastroesophageal reflux disease)   . H/O hiatal hernia   . Depression     mild  . Allergic bronchopulmonary aspergillosis 2008    sees Dr Edmund Hilda pulmonology  . Anemia     iron deficiency, resolved  . Normal cardiac stress test 11/2011  . OSA (obstructive sleep apnea) 02/2012    has stopped using  cpap  . Anginal pain   . Chronic bronchitis   . Exertional dyspnea     "sometimes lying down; always w/exertion" (04/13/2012)  . Headache(784.0)     "often but not daily" (04/13/2012)  . Migraines   . Anxiety   . Pneumonia 11/2011    "before  2013 I hadn't had pneumonia since I was a child" (04/13/2012)  . Anxiety state, unspecified 08/18/2013     Review of Systems neg for any significant sore throat, dysphagia, itching, sneezing, nasal congestion or excess/ purulent secretions, fever, chills, sweats, unintended wt loss, pleuritic or exertional cp, hempoptysis, orthopnea pnd or change in chronic leg swelling. Also denies presyncope, palpitations, heartburn, abdominal pain, nausea, vomiting, diarrhea or change in bowel or urinary habits, dysuria,hematuria, rash, arthralgias, visual complaints, headache, numbness weakness or ataxia.     Objective:   Physical Exam  Gen. Pleasant, obese, in no distress, normal affect ENT - no lesions, no post nasal drip, class 2-3 airway Neck: No JVD, no thyromegaly, no carotid bruits Lungs: no use of accessory muscles, no dullness to percussion, decreased with bilateral scattered rhonchi  Cardiovascular: Rhythm regular, heart sounds  normal, no murmurs or gallops, no peripheral edema Abdomen: soft and non-tender, no hepatosplenomegaly, BS normal. Musculoskeletal: No deformities, no cyanosis or clubbing Neuro:  alert, non focal, no tremors       Assessment & Plan:

## 2014-01-08 NOTE — Patient Instructions (Signed)
Pred 10 mg Take 4 tabs  daily with food x 4 days, then 3 tabs daily x 4 days, then 2 tabs daily x 4 days, then 1 tab daily x4 days then stop. #40 Stay at 20 mg if no better & call us to report

## 2014-01-09 NOTE — Assessment & Plan Note (Signed)
We'll treat her as a flare of ABPA due to acute bronchitis. The key he is well be an extended prednisone taper, starting at 40 mg and decrease to 20 mg over a week. Further taper will only be performed after improvement in her symptoms. Since sputum has change color, not feel the need for more antibiotics If no improvement in one week, will proceed with sputum culture and chest x-ray  Pred 10 mg Take 4 tabs  daily with food x 4 days, then 3 tabs daily x 4 days, then 2 tabs daily x 4 days, then 1 tab daily x4 days then stop. #40 Stay at 20 mg if no better & call us to report

## 2014-01-13 ENCOUNTER — Ambulatory Visit: Payer: Managed Care, Other (non HMO) | Admitting: Family Medicine

## 2014-01-17 ENCOUNTER — Other Ambulatory Visit: Payer: Self-pay | Admitting: Pulmonary Disease

## 2014-01-17 MED ORDER — HYDROCOD POLST-CHLORPHEN POLST 10-8 MG/5ML PO LQCR: 5.0000 mL | Freq: Two times a day (BID) | ORAL | Status: DC

## 2014-01-17 MED ORDER — PREDNISONE 10 MG PO TABS: 10.0000 mg | ORAL_TABLET | Freq: Every day | ORAL | Status: DC

## 2014-02-12 ENCOUNTER — Ambulatory Visit: Payer: Managed Care, Other (non HMO) | Admitting: Internal Medicine

## 2014-02-13 ENCOUNTER — Ambulatory Visit (INDEPENDENT_AMBULATORY_CARE_PROVIDER_SITE_OTHER): Payer: Managed Care, Other (non HMO) | Admitting: Internal Medicine

## 2014-02-13 ENCOUNTER — Encounter: Payer: Self-pay | Admitting: Internal Medicine

## 2014-02-13 VITALS — BP 98/60 | HR 84 | Ht 63.0 in | Wt 165.0 lb

## 2014-02-13 DIAGNOSIS — J45909 Unspecified asthma, uncomplicated: Secondary | ICD-10-CM

## 2014-02-13 DIAGNOSIS — B4481 Allergic bronchopulmonary aspergillosis: Secondary | ICD-10-CM

## 2014-02-13 MED ORDER — PREDNISONE 10 MG PO TABS: ORAL_TABLET | ORAL | Status: DC

## 2014-02-13 NOTE — Patient Instructions (Addendum)
#  Asthma + ABPA - you are in some flare up based on history though lung function is at baseline of fev1 1.4L/58%  - redo prednisone burst as follows  - Take prednisone 40 mg daily x 2 days, then 30mg  daily x 2 days, 20mg  daily x 2 days, then 10mg  daily to continue  - continue all asthma medications - ensure flu shot in fall - I will brain storm with my manager about a protocolized response for your asthma attacks   #Followup  - 3 months  - spirometry at followup

## 2014-02-13 NOTE — Progress Notes (Signed)
Subjective:    Patient ID: Victoria [name], female    DOB: [birth year], 57 y.o.   MRN: [id number]  HPI  e persistent asthma + ABPA: maintained on asmanex, singulair, spiriva, NAC, prilosec, prednisone 5mg /day scheduled + prn duoneb. Has visited [facility] i past for 2nd opinion. Has tried itraconazole in past; not inclined to try again. At risk for recurrent AE-asthma/ABPA; has peripheral eosinophilia   OV 11/06/2013  Chief Complaint  Patient presents with  . Follow-up    Pt states breathing has improved since last OV. C/o intermittent mild dry cough. Denies SOB and CP. Pt states overall she is doing well.     Currently well. But reports since Jan 2015 recurrent AE-asthma/abpa needing multiple increases in prednisone. States that higher prednisone causes tachycardia. There was holter ordered in march 2015 but not sure she had it. She declined cardiac stress test. SHe thinks winter and pollen this 2015 season have been hard on her. Wondering about alternatives to prednisone during flare up because of perceived tachycardia with higher doses of prednisone. Also, she is inclined to reduce her daily prednisone dose if possible. She is frustrated by her chronic health but at this time not inclined to re-visit [facility]. She is open to research trials but we do not have any for asthma. No new issus   #Asthma + ABPA  - glad you are some better  - refills for meds done  - continue all of them - reduce daily prednisone from 5mg  to 3mg  a day - in future can try medrol for flare ups due to tachycardia with prednisone  #Followup   - 3 months  - spirometry at followup  OV 02/13/2014  Chief Complaint  Patient presents with  . Follow-up    Pt c/o wheezing, DOE and dry cough. Pt denies CP/tightness. Pt states she is using duoneb1-2 times per day.    Followup severe persistent asthma with allergic bronchopulmonary aspergillosis.  Since seeing me last in May 2015 she's had 2 acute visits to our office for  asthma exacerbation. The first episode was treated with a short course of prednisone and therefore should come back again and did a longer course of prednisone. She feels that her exacerbation should have been treated with longer course of prednisone and she should be given  some amount of control with her taper.  She feels had this happeneed a repeat urgent visit to office could have been avoided. Thereore, she is asking for some sort of a standard protocol based response for her asthma care depending on her symptoms. Currently she feels a little bit out of sorts with increased wheezing and cough. She wants anoter  course of prednisone burst but says this time she wants a short course only. She has now at baseline of prednisone to 10 mg per day instead of previous 3mg  per day; she had tried wean her prednisone to a lower dose a few months ago  There are no other issues. FEV1 is 1.42/50% with a ratio 70 and is similar to prior visits  Review of Systems  Constitutional: Negative for fever and unexpected weight change.  HENT: Negative for congestion, dental problem, ear pain, nosebleeds, postnasal drip, rhinorrhea, sinus pressure, sneezing, sore throat and trouble swallowing.   Eyes: Negative for redness and itching.  Respiratory: Positive for cough, shortness of breath and wheezing. Negative for chest tightness.   Cardiovascular: Negative for palpitations and leg swelling.  Gastrointestinal: Negative for nausea and vomiting.  Genitourinary: Negative for dysuria.  Musculoskeletal: Negative for joint swelling.  Skin: Negative for rash.  Neurological: Negative for headaches.  Hematological: Does not bruise/bleed easily.  Psychiatric/Behavioral: Negative for dysphoric mood. The patient is not nervous/anxious.        Objective:   Physical Exam  Vitals reviewed. Constitutional: She is oriented to person, place, and time. She appears well-developed and well-nourished. No distress.  cushingoid  HENT:   Head: Normocephalic and atraumatic.  Right Ear: External ear normal.  Left Ear: External ear normal.  Mouth/Throat: Oropharynx is clear and moist. No oropharyngeal exudate.  Eyes: Conjunctivae and EOM are normal. Pupils are equal, round, and reactive to light. Right eye exhibits no discharge. Left eye exhibits no discharge. No scleral icterus.  Neck: Normal range of motion. Neck supple. No JVD present. No tracheal deviation present. No thyromegaly present.  Cardiovascular: Normal rate, regular rhythm, normal heart sounds and intact distal pulses.  Exam reveals no gallop and no friction rub.   No murmur heard. Pulmonary/Chest: Effort normal and breath sounds normal. No respiratory distress. She has no wheezes. She has no rales. She exhibits no tenderness.  Abdominal: Soft. Bowel sounds are normal. She exhibits no distension and no mass. There is no tenderness. There is no rebound and no guarding.  Musculoskeletal: Normal range of motion. She exhibits no edema and no tenderness.  Lymphadenopathy:    She has no cervical adenopathy.  Neurological: She is alert and oriented to person, place, and time. She has normal reflexes. No cranial nerve deficit. She exhibits normal muscle tone. Coordination normal.  Skin: Skin is warm and dry. No rash noted. She is not diaphoretic. No erythema. No pallor.  Psychiatric: She has a normal mood and affect. Her behavior is normal. Judgment and thought content normal.    Filed Vitals:   02/13/14 1143  BP: 98/60  Pulse: 84  Height: 5\' 3"  (1.6 m)  Weight: 165 lb (74.844 kg)  SpO2: 98%         Assessment & Plan:  #Asthma + ABPA - you are in some flare up based on history though lung function is at baseline of fev1 1.4L/58%  - redo prednisone burst as follows  - Take prednisone 40 mg daily x 2 days, then 30mg  daily x 2 days, 20mg  daily x 2 days, then 10mg  daily to continue  - continue all asthma medications - ensure flu shot in fall - I will brain storm  with my manager about a protocolized response for your asthma attacks; agreee that given your ABPA that your prednisone needs are unique   #Followup  - 3 months  - spirometry at followup

## 2014-02-16 NOTE — Assessment & Plan Note (Signed)
#  Asthma + ABPA - you are in some flare up based on history though lung function is at baseline of fev1 1.4L/58%  - redo prednisone burst as follows  - Take prednisone 40 mg daily x 2 days, then 30mg  daily x 2 days, 20mg  daily x 2 days, then 10mg  daily to continue  - continue all asthma medications - ensure flu shot in fall - I will brain storm with my manager about a protocolized response for your asthma attacks; agreee that given your ABPA that your prednisone needs are unique   #Followup  - 3 months  - spirometry at followup

## 2014-03-03 ENCOUNTER — Encounter: Payer: Self-pay | Admitting: Family Medicine

## 2014-03-03 ENCOUNTER — Ambulatory Visit (INDEPENDENT_AMBULATORY_CARE_PROVIDER_SITE_OTHER): Payer: Managed Care, Other (non HMO) | Admitting: Family Medicine

## 2014-03-03 VITALS — BP 100/60 | HR 88 | Temp 98.3°F | Ht 63.0 in | Wt 165.0 lb

## 2014-03-03 DIAGNOSIS — F329 Major depressive disorder, single episode, unspecified: Secondary | ICD-10-CM

## 2014-03-03 DIAGNOSIS — R112 Nausea with vomiting, unspecified: Secondary | ICD-10-CM

## 2014-03-03 DIAGNOSIS — J441 Chronic obstructive pulmonary disease with (acute) exacerbation: Secondary | ICD-10-CM

## 2014-03-03 DIAGNOSIS — K2100 Gastro-esophageal reflux disease with esophagitis, without bleeding: Secondary | ICD-10-CM

## 2014-03-03 DIAGNOSIS — Z23 Encounter for immunization: Secondary | ICD-10-CM

## 2014-03-03 DIAGNOSIS — R030 Elevated blood-pressure reading, without diagnosis of hypertension: Secondary | ICD-10-CM

## 2014-03-03 DIAGNOSIS — E785 Hyperlipidemia, unspecified: Secondary | ICD-10-CM

## 2014-03-03 DIAGNOSIS — F419 Anxiety disorder, unspecified: Secondary | ICD-10-CM

## 2014-03-03 DIAGNOSIS — F3289 Other specified depressive episodes: Secondary | ICD-10-CM

## 2014-03-03 DIAGNOSIS — F341 Dysthymic disorder: Secondary | ICD-10-CM

## 2014-03-03 MED ORDER — ALPRAZOLAM 0.5 MG PO TABS: 0.5000 mg | ORAL_TABLET | Freq: Two times a day (BID) | ORAL | Status: DC | PRN

## 2014-03-03 MED ORDER — VENLAFAXINE HCL ER 150 MG PO CP24: 150.0000 mg | ORAL_CAPSULE | Freq: Every day | ORAL | Status: DC

## 2014-03-03 MED ORDER — RANITIDINE HCL 300 MG PO TABS: 300.0000 mg | ORAL_TABLET | Freq: Every day | ORAL | Status: DC

## 2014-03-03 NOTE — Patient Instructions (Signed)
Gastroesophageal Reflux Disease, Adult Gastroesophageal reflux disease (GERD) happens when acid from your stomach flows up into the esophagus. When acid comes in contact with the esophagus, the acid causes soreness (inflammation) in the esophagus. Over time, GERD may create small holes (ulcers) in the lining of the esophagus. CAUSES   Increased body weight. This puts pressure on the stomach, making acid rise from the stomach into the esophagus.  Smoking. This increases acid production in the stomach.  Drinking alcohol. This causes decreased pressure in the lower esophageal sphincter (valve or ring of muscle between the esophagus and stomach), allowing acid from the stomach into the esophagus.  Late evening meals and a full stomach. This increases pressure and acid production in the stomach.  A malformed lower esophageal sphincter. Sometimes, no cause is found. SYMPTOMS   Burning pain in the lower part of the mid-chest behind the breastbone and in the mid-stomach area. This may occur twice a week or more often.  Trouble swallowing.  Sore throat.  Dry cough.  Asthma-like symptoms including chest tightness, shortness of breath, or wheezing. DIAGNOSIS  Your caregiver may be able to diagnose GERD based on your symptoms. In some cases, X-rays and other tests may be done to check for complications or to check the condition of your stomach and esophagus. TREATMENT  Your caregiver may recommend over-the-counter or prescription medicines to help decrease acid production. Ask your caregiver before starting or adding any new medicines.  HOME CARE INSTRUCTIONS   Change the factors that you can control. Ask your caregiver for guidance concerning weight loss, quitting smoking, and alcohol consumption.  Avoid foods and drinks that make your symptoms worse, such as:  Caffeine or alcoholic drinks.  Chocolate.  Peppermint or mint flavorings.  Garlic and onions.  Spicy foods.  Citrus fruits,  such as oranges, lemons, or limes.  Tomato-based foods such as sauce, chili, salsa, and pizza.  Fried and fatty foods.  Avoid lying down for the 3 hours prior to your bedtime or prior to taking a nap.  Eat small, frequent meals instead of large meals.  Wear loose-fitting clothing. Do not wear anything tight around your waist that causes pressure on your stomach.  Raise the head of your bed 6 to 8 inches with wood blocks to help you sleep. Extra pillows will not help.  Only take over-the-counter or prescription medicines for pain, discomfort, or fever as directed by your caregiver.  Do not take aspirin, ibuprofen, or other nonsteroidal anti-inflammatory drugs (NSAIDs). SEEK IMMEDIATE MEDICAL CARE IF:   You have pain in your arms, neck, jaw, teeth, or back.  Your pain increases or changes in intensity or duration.  You develop nausea, vomiting, or sweating (diaphoresis).  You develop shortness of breath, or you faint.  Your vomit is green, yellow, black, or looks like coffee grounds or blood.  Your stool is red, bloody, or black. These symptoms could be signs of other problems, such as heart disease, gastric bleeding, or esophageal bleeding. MAKE SURE YOU:   Understand these instructions.  Will watch your condition.  Will get help right away if you are not doing well or get worse. Document Released: 03/16/2005 Document Revised: 08/29/2011 Document Reviewed: 12/24/2010 Exit[employer] Patient Information 2015 [employer]. This information is not intended to replace advice given to you by your health care provider. Make sure you discuss any questions you have with your health care provider.  

## 2014-03-03 NOTE — Progress Notes (Signed)
Pre visit review using our clinic review tool, if applicable. No additional management support is needed unless otherwise documented below in the visit note. 

## 2014-03-04 ENCOUNTER — Encounter: Payer: Self-pay | Admitting: Family Medicine

## 2014-03-04 NOTE — Assessment & Plan Note (Signed)
Worsening recently, will increase Venlafaxine to 150 mg dialy

## 2014-03-04 NOTE — Assessment & Plan Note (Signed)
Tried low dose Crestor again, restart garlic, recheck lipids in a couple months and is willing to try low dose Livalo one more time

## 2014-03-04 NOTE — Assessment & Plan Note (Signed)
Worsening, continue Zegerid and added Ranitidine. Avoid offending foods, start probiotics. Do not eat large meals in late evening and consider raising head of bed. Referred to gastroenterology for further consideration

## 2014-03-04 NOTE — Progress Notes (Signed)
Patient ID: Victoria [name], female   DOB: 10-Jul-1955, 58 y.o.   MRN: [id number] Victoria [name] [id number] 05/03/1956 03/04/2014      Progress Note-Follow Up  Subjective  Chief Complaint  Chief Complaint  Patient presents with  . Follow-up  . Injections    flu    HPI  Patient is a 58 year old female in today for routine medical care. Patient is in today with her son for followup. She notes that her respiratory symptoms improved after a large dose steroid shot. She feels better in this regard but is having worsening reflux symptoms. Has epigastric pain as well as some chest discomfort at times sour taste in her mouth and frequent heartburn. Zegerid is more helpful than anything else she tried. She recently had a crit Crestor due to the increased myalgias. No other recent illness. Denies palp/sob or GU c/o. Taking meds as prescribed  Past Medical History  Diagnosis Date  . Asthma   . Obesity   . Maxillary sinusitis   . Osteoarthritis   . IBS (irritable bowel syndrome)   . Hyperlipidemia   . COPD (chronic obstructive pulmonary disease)   . Pulmonary nodules   . Osteoporosis   . Nasal congestion 9/12    current cold  . Cough   . Wheezing   . GERD (gastroesophageal reflux disease)   . H/O hiatal hernia   . Depression     mild  . Allergic bronchopulmonary aspergillosis 2008    sees Dr Edmund Hilda pulmonology  . Anemia     iron deficiency, resolved  . Normal cardiac stress test 11/2011  . OSA (obstructive sleep apnea) 02/2012    has stopped using  cpap  . Anginal pain   . Chronic bronchitis   . Exertional dyspnea     "sometimes lying down; always w/exertion" (04/13/2012)  . Headache(784.0)     "often but not daily" (04/13/2012)  . Migraines   . Anxiety   . Pneumonia 11/2011    "before 2013 I hadn't had pneumonia since I was a child" (04/13/2012)  . Anxiety state, unspecified 08/18/2013    Past Surgical History  Procedure Laterality Date  . Cesarean section  1985  .  Hernia repair  04/13/2012    VHR laparoscopic  . Appendectomy  1989  . Ventral hernia repair  04/13/2012    Procedure: LAPAROSCOPIC VENTRAL HERNIA;  Surgeon: Adin Hector, MD;  Location: [facility];  Service: General;  Laterality: N/A;  laparoscopic repair of incarcerated hernia    Family History  Problem Relation Age of Onset  . Breast cancer Mother   . Prostate cancer Father   . Diverticulosis Father   . Prostate cancer Father     prostate  . Pulmonary embolism Brother     recurrent  . Breast cancer Sister     breast    History   Social History  . Marital Status: Married    Spouse Name: N/A    Number of Children: 1  . Years of Education: N/A   Occupational History  .     Social History Main Topics  . Smoking status: Never Smoker   . Smokeless tobacco: Never Used  . Alcohol Use: 2.4 oz/week    4 Glasses of wine per week     Comment: 04/13/2012 "beer in the summer; wine 3-4 times/wk"  . Drug Use: No  . Sexual Activity: Yes     Comment: gluten free, lives with husband and son with CP quadriplegia  Other Topics Concern  . Not on file   Social History Narrative  . No narrative on file    Current Outpatient Prescriptions on File Prior to Visit  Medication Sig Dispense Refill  . Acetylcysteine (NAC) 600 MG CAPS Take 1 capsule by mouth 2 (two) times daily.      Marland Kitchen albuterol (PROAIR HFA) 108 (90 BASE) MCG/ACT inhaler Inhale 2 puffs into the lungs every 6 (six) hours as needed. For shortness of breath      . aspirin 81 MG tablet Take 81 mg by mouth daily.       . chlorpheniramine-HYDROcodone (TUSSIONEX PENNKINETIC ER) 10-8 MG/5ML LQCR Take 5 mLs by mouth 2 (two) times daily.  120 mL  0  . Cholecalciferol (VITAMIN D) 2000 UNITS CAPS Take 2,000 Units by mouth.        . fexofenadine (ALLEGRA) 180 MG tablet Take 180 mg by mouth daily.      Marland Kitchen ibuprofen (ADVIL,MOTRIN) 200 MG tablet Take 400 mg by mouth every 6 (six) hours as needed. For pain      . ipratropium-albuterol  (DUONEB) 0.5-2.5 (3) MG/3ML SOLN Take 30mL up to 4-6 times per day. prn      . mometasone (ASMANEX 60 METERED DOSES) 220 MCG/INH inhaler USE 2 INHALATIONS ORALLY EVERY EVENING  3 Inhaler  1  . montelukast (SINGULAIR) 10 MG tablet TAKE 1 TABLET BY MOUTH TWICE DAILY  180 tablet  3  . Omeprazole-Sodium Bicarbonate (ZEGERID) 20-1100 MG CAPS capsule Take 1 capsule by mouth daily before breakfast.  90 capsule  1  . predniSONE (DELTASONE) 10 MG tablet Take 1 tablet (10 mg total) by mouth daily with breakfast.  30 tablet  1  . tiotropium (SPIRIVA HANDIHALER) 18 MCG inhalation capsule INHALE CONTENTS OF 1 CAPSULE THROUGH HANDIHALER DEVICE EVERY DAY  90 capsule  3  . triamcinolone (NASACORT AQ) 55 MCG/ACT AERO nasal inhaler Place 2 sprays into the nose daily.       No current facility-administered medications on file prior to visit.    Allergies  Allergen Reactions  . Beclomethasone Dipropionate Hives and Other (See Comments)     weight gain  . Budesonide-Formoterol Fumarate Hives  . Sulfonamide Derivatives Hives and Rash  . Mometasone Furo-Formoterol Fum Hives and Other (See Comments)    weight gain  . Statins     Myalgias, RLS    Review of Systems  Review of Systems  Constitutional: Positive for malaise/fatigue. Negative for fever.  HENT: Negative for congestion.   Eyes: Negative for discharge.  Respiratory: Negative for shortness of breath.   Cardiovascular: Negative for chest pain, palpitations and leg swelling.  Gastrointestinal: Positive for heartburn, nausea and abdominal pain. Negative for diarrhea.  Genitourinary: Negative for dysuria.  Musculoskeletal: Negative for falls.  Skin: Negative for rash.  Neurological: Negative for loss of consciousness and headaches.  Endo/Heme/Allergies: Negative for polydipsia.  Psychiatric/Behavioral: Positive for depression. Negative for suicidal ideas. The patient is nervous/anxious. The patient does not have insomnia.     Objective  BP 100/60   Pulse 88  Temp(Src) 98.3 F (36.8 C) (Oral)  Ht 5\' 3"  (1.6 m)  Wt 165 lb (74.844 kg)  BMI 29.24 kg/m2  SpO2 92%  Physical Exam  Physical Exam  Constitutional: She is oriented to person, place, and time and well-developed, well-nourished, and in no distress. No distress.  HENT:  Head: Normocephalic and atraumatic.  Eyes: Conjunctivae are normal.  Neck: Neck supple. No thyromegaly present.  Cardiovascular: Normal rate, regular  rhythm and normal heart sounds.   No murmur heard. Pulmonary/Chest: Effort normal and breath sounds normal. She has no wheezes.  Abdominal: She exhibits no distension and no mass.  Musculoskeletal: She exhibits no edema.  Lymphadenopathy:    She has no cervical adenopathy.  Neurological: She is alert and oriented to person, place, and time.  Skin: Skin is warm and dry. No rash noted. She is not diaphoretic.  Psychiatric: Memory, affect and judgment normal.    Lab Results  Component Value Date   TSH 2.351 08/12/2013   Lab Results  Component Value Date   WBC 10.3 07/27/2013   HGB 14.4 07/27/2013   HCT 42.7 07/27/2013   MCV 91.0 07/27/2013   PLT 249 07/27/2013   Lab Results  Component Value Date   CREATININE 0.67 07/27/2013   BUN 14 07/27/2013   NA 139 07/27/2013   K 3.5* 07/27/2013   CL 100 07/27/2013   CO2 24 07/27/2013   Lab Results  Component Value Date   ALT 30 11/02/2012   AST 23 11/02/2012   ALKPHOS 90 11/02/2012   BILITOT 0.3 11/02/2012   Lab Results  Component Value Date   CHOL 186 09/04/2012   Lab Results  Component Value Date   HDL 43.30 09/04/2012   Lab Results  Component Value Date   LDLCALC 125* 09/04/2012   Lab Results  Component Value Date   TRIG 90.0 09/04/2012   Lab Results  Component Value Date   CHOLHDL 4 09/04/2012     Assessment & Plan  Bronchitis, chronic obstructive, with exacerbation Improving, no wheezing or SOB today  ELEVATED BP READING WITHOUT DX HYPERTENSION Well controlled. Encouraged heart healthy diet such as the  DASH diet and exercise as tolerated.   DEPRESSION Worsening recently, will increase Venlafaxine to 150 mg dialy  Esophageal reflux Worsening, continue Zegerid and added Ranitidine. Avoid offending foods, start probiotics. Do not eat large meals in late evening and consider raising head of bed. Referred to gastroenterology for further consideration  HYPERLIPIDEMIA Tried low dose Crestor again, restart garlic, recheck lipids in a couple months and is willing to try low dose Livalo one more time

## 2014-03-04 NOTE — Assessment & Plan Note (Signed)
Improving, no wheezing or SOB today

## 2014-03-04 NOTE — Assessment & Plan Note (Signed)
Well controlled. Encouraged heart healthy diet such as the DASH diet and exercise as tolerated.  

## 2014-03-10 ENCOUNTER — Encounter: Payer: Self-pay | Admitting: Physician Assistant

## 2014-03-12 ENCOUNTER — Encounter: Payer: Self-pay | Admitting: Family Medicine

## 2014-03-21 ENCOUNTER — Ambulatory Visit (INDEPENDENT_AMBULATORY_CARE_PROVIDER_SITE_OTHER): Payer: Managed Care, Other (non HMO) | Admitting: Physician Assistant

## 2014-03-21 ENCOUNTER — Other Ambulatory Visit: Payer: Self-pay | Admitting: Emergency Medicine

## 2014-03-21 ENCOUNTER — Encounter: Payer: Self-pay | Admitting: Physician Assistant

## 2014-03-21 VITALS — BP 114/70 | HR 72 | Ht 62.5 in | Wt 166.5 lb

## 2014-03-21 DIAGNOSIS — R1314 Dysphagia, pharyngoesophageal phase: Secondary | ICD-10-CM

## 2014-03-21 DIAGNOSIS — K219 Gastro-esophageal reflux disease without esophagitis: Secondary | ICD-10-CM

## 2014-03-21 NOTE — Patient Instructions (Signed)
You have been scheduled for an endoscopy. Please follow written instructions given to you at your visit today. If you use inhalers (even only as needed), please bring them with you on the day of your procedure. Your physician has requested that you go to www.startemmi.com and enter the access code given to you at your visit today. This web site gives a general overview about your procedure. However, you should still follow specific instructions given to you by our office regarding your preparation for the procedure.  Stay on Zegerid over the counter every morning. Zantac ( ranitidine) 300 mg at bedtime for the next 4-6 weeks.

## 2014-03-21 NOTE — Progress Notes (Addendum)
Subjective:    Patient ID: Victoria [name], female    DOB: [birth year], 57 y.o.   MRN: [id number]  HPI   Victoria [name] is a pleasant 58 year old white female known to Dr. Delfin Edis previously who is requesting to establish with Dr. Virgina Norfolk rtle. She had undergone a colonoscopy in February of 2012 was noted to have mild diverticulosis of the left colon no polyps. She has lifelong history of asthma, also with hyperlipidemia, obesity depression, and chronic bronchitis. She relates a long history of GERD. She is says she has been on medications for reflux for the past 20-25 years usually given to her by pulmonologist because of her asthma. She also had taken Fosamax for many years due to steroid related osteoporosis. She is currently using OTC Zegerid with good success. She comes in today because of frequent episodes of regurgitation. She denies any regular heartburn or indigestion. She said at least a couple of times per month she will have episodes of dysphagia and eventually he has to force herself to vomit to bring the food back up. In between these episodes she is fine. She does occasionally get subxiphoid chest pain which she attributes to her esophagus. She says she has had cardiac evaluation which has been negative. She had come in today because of increased frequency of the regurgitation episodes which at this point are occurring to 3 times per month.    Review of Systems  Constitutional: Negative.   HENT: Positive for trouble swallowing.   Eyes: Negative.   Respiratory: Negative.   Cardiovascular: Negative.   Gastrointestinal: Positive for vomiting.  Endocrine: Negative.   Genitourinary: Negative.   Musculoskeletal: Negative.   Skin: Negative.   Allergic/Immunologic: Negative.   Neurological: Negative.   Hematological: Negative.   Psychiatric/Behavioral: Negative.    Outpatient Prescriptions Prior to Visit  Medication Sig Dispense Refill  . Acetylcysteine (NAC) 600 MG CAPS Take 1 capsule by  mouth 2 (two) times daily.      Marland Kitchen albuterol (PROAIR HFA) 108 (90 BASE) MCG/ACT inhaler Inhale 2 puffs into the lungs every 6 (six) hours as needed. For shortness of breath      . ALPRAZolam (XANAX) 0.5 MG tablet Take 1 tablet (0.5 mg total) by mouth 2 (two) times daily as needed for anxiety (Palpatations, SOB).  30 tablet  1  . aspirin 81 MG tablet Take 81 mg by mouth daily.       . chlorpheniramine-HYDROcodone (TUSSIONEX PENNKINETIC ER) 10-8 MG/5ML LQCR Take 5 mLs by mouth 2 (two) times daily.  120 mL  0  . Cholecalciferol (VITAMIN D) 2000 UNITS CAPS Take 2,000 Units by mouth.        . fexofenadine (ALLEGRA) 180 MG tablet Take 180 mg by mouth as needed.       Marland Kitchen ibuprofen (ADVIL,MOTRIN) 200 MG tablet Take 400 mg by mouth every 6 (six) hours as needed. For pain      . ipratropium-albuterol (DUONEB) 0.5-2.5 (3) MG/3ML SOLN Take 97mL up to 4-6 times per day. prn      . mometasone (ASMANEX 60 METERED DOSES) 220 MCG/INH inhaler USE 2 INHALATIONS ORALLY EVERY EVENING  3 Inhaler  1  . montelukast (SINGULAIR) 10 MG tablet TAKE 1 TABLET BY MOUTH TWICE DAILY  180 tablet  3  . Omeprazole-Sodium Bicarbonate (ZEGERID) 20-1100 MG CAPS capsule Take 1 capsule by mouth daily before breakfast.  90 capsule  1  . predniSONE (DELTASONE) 10 MG tablet Take 1 tablet (10 mg total) by mouth daily with  breakfast.  30 tablet  1  . ranitidine (ZANTAC) 300 MG tablet Take 1 tablet (300 mg total) by mouth at bedtime.  30 tablet  2  . tiotropium (SPIRIVA HANDIHALER) 18 MCG inhalation capsule INHALE CONTENTS OF 1 CAPSULE THROUGH HANDIHALER DEVICE EVERY DAY  90 capsule  3  . triamcinolone (NASACORT AQ) 55 MCG/ACT AERO nasal inhaler Place 2 sprays into the nose as needed.       . venlafaxine XR (EFFEXOR-XR) 150 MG 24 hr capsule Take 1 capsule (150 mg total) by mouth daily with breakfast.  90 capsule  2   No facility-administered medications prior to visit.   Allergies  Allergen Reactions  . Beclomethasone Dipropionate Hives and  Other (See Comments)     weight gain  . Budesonide-Formoterol Fumarate Hives  . Sulfonamide Derivatives Hives and Rash  . Mometasone Furo-Formoterol Fum Hives and Other (See Comments)    weight gain  . Statins     Myalgias, RLS   Patient Active Problem List   Diagnosis Date Noted  . Anxiety state, unspecified 08/18/2013  . Ventral hernia 04/13/2012  . Seasonal and perennial allergic rhinitis 10/22/2011  . Fatigue 09/01/2011  . Bronchitis, chronic obstructive, with exacerbation 06/01/2011  . Chest pain, atypical 06/01/2011  . OSA (obstructive sleep apnea) 11/13/2010  . Environmental allergies 11/13/2010  . Hives 11/03/2010  . UMBILICAL HERNIA 20/94/7096  . DIVERTICULOSIS, COLON 06/11/2010  . HEPATIC CYST 06/11/2010  . SCOLIOSIS 06/11/2010  . TRANSAMINASES, SERUM, ELEVATED 05/27/2010  . NECK SPRAIN AND STRAIN 08/20/2009  . HEADACHE 08/03/2009  . ELEVATED BP READING WITHOUT DX HYPERTENSION 08/03/2009  . Obesity, unspecified 10/21/2008  . Esophageal reflux 10/21/2008  . PALPITATIONS 08/19/2008  . CHONDROMALACIA OF PATELLA 04/15/2008  . HIP PAIN, LEFT 04/15/2008  . KNEE PAIN, RIGHT 04/15/2008  . A B P A-ALLERGIC BRONCHOPULMONARY ASPERGILLOSIS 12/14/2007  . ASTHMA, PERSISTENT, SEVERE 11/16/2007  . HYPERLIPIDEMIA 09/11/2007  . STRESS INCONTINENCE 09/11/2007  . Cushing's syndrome 12/11/2006  . DEPRESSION 12/11/2006  . OSTEOARTHRITIS 12/11/2006  . OSTEOPENIA 12/11/2006   History  Substance Use Topics  . Smoking status: Never Smoker   . Smokeless tobacco: Never Used  . Alcohol Use: 2.4 oz/week    4 Glasses of wine per week     Comment: 04/13/2012 "beer in the summer; wine 3-4 times/wk"   family history includes Breast cancer in her mother and sister; Diverticulosis in her father; Prostate cancer in her father and father; Pulmonary embolism in her brother.     Objective:   Physical Exam  well-developed white female in no acute distress, pleasant blood pressure 114/70 pulse  72 height 5 foot 2 weight 166. HEENT; nontraumatic normocephalic EOMI PERRLA sclera anicteric, Supple ;no JVD, Cardiovascular; regular rate and rhythm with S1-S2 no murmur or gallop, Pulmonary; clear bilaterally, Abdomen; large soft nontender nondistended bowel sounds are active there is no palpable mass or hepatosplenomegaly, Rectal ;exam not done, Extremities; no clubbing cyanosis or edema skin warm and dry, Psych; mood and affect appropriate.        Assessment & Plan:  #72  58 year old female with chronic GERD and intermittent episodes of solid food dysphagia requiring regurgitation. Rule out stricture, esophageal ring ,or underlying motility disorder #2 diverticulosis #3 asthma #4 chronic bronchitis #5 IBS  Plan; Patient will continue over-the-counter Zegerid 20 mg daily Will schedule for EGD with probable esophageal dilation with Dr. Hilarie Fredrickson. Procedure discussed in detail with the patient she is agreeable to proceed  Addendum: Reviewed and agree with initial management.  Jerene Bears, MD

## 2014-03-28 ENCOUNTER — Encounter: Payer: Self-pay | Admitting: Internal Medicine

## 2014-04-16 ENCOUNTER — Encounter: Payer: Managed Care, Other (non HMO) | Admitting: Internal Medicine

## 2014-04-16 ENCOUNTER — Encounter: Payer: Self-pay | Admitting: Internal Medicine

## 2014-04-16 ENCOUNTER — Ambulatory Visit (AMBULATORY_SURGERY_CENTER): Payer: Managed Care, Other (non HMO) | Admitting: Internal Medicine

## 2014-04-16 VITALS — BP 137/81 | HR 74 | Temp 98.1°F | Resp 13 | Ht 62.0 in | Wt 166.0 lb

## 2014-04-16 DIAGNOSIS — K219 Gastro-esophageal reflux disease without esophagitis: Secondary | ICD-10-CM

## 2014-04-16 DIAGNOSIS — R1310 Dysphagia, unspecified: Secondary | ICD-10-CM

## 2014-04-16 DIAGNOSIS — K222 Esophageal obstruction: Secondary | ICD-10-CM

## 2014-04-16 DIAGNOSIS — R1314 Dysphagia, pharyngoesophageal phase: Secondary | ICD-10-CM

## 2014-04-16 DIAGNOSIS — R1319 Other dysphagia: Secondary | ICD-10-CM

## 2014-04-16 MED ORDER — SODIUM CHLORIDE 0.9 % IV SOLN: 500.0000 mL | INTRAVENOUS | Status: DC

## 2014-04-16 NOTE — Op Note (Signed)
[place]  [address] [place], [id number]   ENDOSCOPY PROCEDURE REPORT  PATIENT: Victoria [name], Victoria [name]  MR#: [id number] BIRTHDATE: 05/17/1956 , 57  yrs. old GENDER: female ENDOSCOPIST: Jerene Bears, MD PROCEDURE DATE:  04/16/2014 PROCEDURE:  EGD w/ balloon dilation ASA CLASS:     Class III INDICATIONS:  history of GERD and dysphagia. MEDICATIONS: Monitored anesthesia care and Propofol 230 mg IV TOPICAL ANESTHETIC: none  DESCRIPTION OF PROCEDURE: After the risks benefits and alternatives of the procedure were thoroughly explained, informed consent was obtained.  The LB [id number] endoscope was introduced through the mouth and advanced to the [Referring Physician] , Without limitations.  The instrument was slowly withdrawn as the mucosa was fully examined.   ESOPHAGUS: A mild Schatzki ring was found 32 cm from the incisors. Using a TTS-balloon the stricture was dilated up with 16.5 mm and then to 75mm. This is at the top of a large hiatal hernia.  STOMACH: A 6 cm, 32 to 38 cm from the incisors, hiatal hernia was noted.   The mucosa of the stomach appeared normal.  DUODENUM: The duodenal mucosa showed no abnormalities in the bulb and 2nd part of the duodenum.  Retroflexed views revealed a hiatal hernia.     The scope was then withdrawn from the patient and the procedure completed.  COMPLICATIONS: There were no immediate complications.  ENDOSCOPIC IMPRESSION: 1.   Schatzki ring was found 32 cm from the incisors; Using a TTS-balloon the stricture was dilated up to 56mm 2.   6  cm hiatal hernia was noted. 3.   The mucosa of the stomach appeared normal 4.   The duodenal mucosa showed no abnormalities in the bulb and 2nd part of the duodenum  RECOMMENDATIONS: 1.  Continue daily PPI and ranitidine at bedtime as needed 2.  Repeat dilation as needed 3.  Office follow-up next available  eSigned:  Jerene Bears, MD 04/16/2014 11:04 AM  CC:The Patient and  Willette Alma, MD

## 2014-04-16 NOTE — Patient Instructions (Addendum)
YOU HAD AN ENDOSCOPIC PROCEDURE TODAY AT [facility]: Refer to the procedure report that was given to you for any specific questions about what was found during the examination.  If the procedure report does not answer your questions, please call your gastroenterologist to clarify.  If you requested that your care partner not be given the details of your procedure findings, then the procedure report has been included in a sealed envelope for you to review at your convenience later.  YOU SHOULD EXPECT: Some feelings of bloating in the abdomen. Passage of more gas than usual.  Walking can help get rid of the air that was put into your GI tract during the procedure and reduce the bloating.  DIET: FOLLOW DILATION DIET- SEE HANDOUT Drink plenty of fluids but you should avoid alcoholic beverages for 24 hours.  ACTIVITY: Your care partner should take you home directly after the procedure.  You should plan to take it easy, moving slowly for the rest of the day.  You can resume normal activity the day after the procedure however you should NOT DRIVE or use heavy machinery for 24 hours (because of the sedation medicines used during the test).    SYMPTOMS TO REPORT IMMEDIATELY: A gastroenterologist can be reached at any hour.  During normal business hours, 8:30 AM to 5:00 PM Monday through Friday, call [phone].  After hours and on weekends, please call the GI answering service at [phone] who will take a message and have the physician on call contact you.   Following upper endoscopy (EGD)  Vomiting of blood or coffee ground material  New chest pain or pain under the shoulder blades  Painful or persistently difficult swallowing  New shortness of breath  Fever of 100F or higher  Black, tarry-looking stools  FOLLOW UP: Our staff will call the home number listed on your records the next business day following your procedure to check on you and address any questions or concerns  that you may have at that time regarding the information given to you following your procedure. This is a courtesy call and so if there is no answer at the home number and we have not heard from you through the emergency physician on call, we will assume that you have returned to your regular daily activities without incident.  SIGNATURES/CONFIDENTIALITY: You and/or your care partner have signed paperwork which will be entered into your electronic medical record.  These signatures attest to the fact that that the information above on your After Visit Summary has been reviewed and is understood.  Full responsibility of the confidentiality of this discharge information lies with you and/or your care-partner.  CONTINUE YOUR NORMAL MEDICATIONS INCLUDING ZEGERID DAILY AND RANITIDINE AS NEEDED AT BEDTIME  PLEASE CALL OFFICE TOMORROW TO SET UP A FOLLOW UP APPOINTMENT  REPEAT DILATION AS NEEDED

## 2014-04-16 NOTE — Progress Notes (Signed)
Called to room to assist during endoscopic procedure.  Patient ID and intended procedure confirmed with present staff. Received instructions for my participation in the procedure from the performing physician.  

## 2014-04-16 NOTE — Progress Notes (Signed)
Pt urinated during procedure d/t coughing so hard.  Sent home in hospital gown

## 2014-04-16 NOTE — Progress Notes (Signed)
A/ox3 pleased with MAC, report to ristin RN

## 2014-04-17 ENCOUNTER — Telehealth: Payer: Self-pay | Admitting: *Deleted

## 2014-04-17 NOTE — Telephone Encounter (Signed)
No answer, message left for the patient. 

## 2014-04-23 ENCOUNTER — Other Ambulatory Visit: Payer: Self-pay

## 2014-04-23 DIAGNOSIS — K2100 Gastro-esophageal reflux disease with esophagitis, without bleeding: Secondary | ICD-10-CM

## 2014-04-23 DIAGNOSIS — R112 Nausea with vomiting, unspecified: Secondary | ICD-10-CM

## 2014-04-23 MED ORDER — RANITIDINE HCL 300 MG PO TABS: 300.0000 mg | ORAL_TABLET | Freq: Every day | ORAL | Status: DC

## 2014-04-28 ENCOUNTER — Other Ambulatory Visit: Payer: Managed Care, Other (non HMO)

## 2014-04-30 ENCOUNTER — Other Ambulatory Visit: Payer: Managed Care, Other (non HMO)

## 2014-05-05 ENCOUNTER — Ambulatory Visit: Payer: Managed Care, Other (non HMO) | Admitting: Family Medicine

## 2014-05-09 ENCOUNTER — Ambulatory Visit: Payer: Managed Care, Other (non HMO) | Admitting: Family Medicine

## 2014-05-19 ENCOUNTER — Ambulatory Visit (INDEPENDENT_AMBULATORY_CARE_PROVIDER_SITE_OTHER): Payer: Managed Care, Other (non HMO) | Admitting: Internal Medicine

## 2014-05-19 ENCOUNTER — Encounter: Payer: Self-pay | Admitting: Internal Medicine

## 2014-05-19 ENCOUNTER — Encounter: Payer: Managed Care, Other (non HMO) | Admitting: Internal Medicine

## 2014-05-19 VITALS — BP 138/90 | HR 81 | Ht 62.0 in | Wt 171.0 lb

## 2014-05-19 DIAGNOSIS — B4481 Allergic bronchopulmonary aspergillosis: Secondary | ICD-10-CM

## 2014-05-19 DIAGNOSIS — J4550 Severe persistent asthma, uncomplicated: Secondary | ICD-10-CM

## 2014-05-19 DIAGNOSIS — K219 Gastro-esophageal reflux disease without esophagitis: Secondary | ICD-10-CM

## 2014-05-19 MED ORDER — IPRATROPIUM-ALBUTEROL 0.5-2.5 (3) MG/3ML IN SOLN: 3.0000 mL | Freq: Four times a day (QID) | RESPIRATORY_TRACT | Status: DC | PRN

## 2014-05-19 NOTE — Progress Notes (Addendum)
Subjective:    Patient ID: Victoria [name], female    DOB: 1956-04-27, 58 y.o.   MRN: [id number]  HPI   fU ASThma -severe persistent + ABPA in setting of large hiatal hernia  OV 05/19/2014  Chief Complaint  Patient presents with  . Follow-up    Pt states she has been on pred for 3 weeks. Pt c/o increase of SOB, fatigue and mild chest tightness. Pt denies change in cough.     Last visit was in 02/13/2014. She feels that she is in constant state of exacerbation. Most recently for the last 2 weeks she is on increased dose of prednisone anywhere between 40 mg and 20 mg which is self directed. She is frustrated by repeated exacerbations and feeling poorly, a constant basis. She is wondering  about a repeat CT scan of the chest. To reassess her bronchiectasis. Alternate this at [facility] because she has seen Dr. Brayton Layman craft before There is no history of any sputum production or worsening sputum volume a change in color of sputum   Past medical and social history reviewed: The son was hospitalized for aspiration pneumonia October/November 2015 by the hospitalist service. She is sp eso dilatation by Dr Hilarie Fredrickson few weeks ago due to bad GERD.     Review of Systems  Constitutional: Negative for fever and unexpected weight change.  HENT: Negative for congestion, dental problem, ear pain, nosebleeds, postnasal drip, rhinorrhea, sinus pressure, sneezing, sore throat and trouble swallowing.   Eyes: Negative for redness and itching.  Respiratory: Positive for chest tightness and shortness of breath. Negative for cough and wheezing.   Cardiovascular: Negative for palpitations and leg swelling.  Gastrointestinal: Negative for nausea and vomiting.  Genitourinary: Negative for dysuria.  Musculoskeletal: Negative for joint swelling.  Skin: Negative for rash.  Neurological: Negative for headaches.  Hematological: Does not bruise/bleed easily.  Psychiatric/Behavioral: Negative for  dysphoric mood. The patient is not nervous/anxious.    Current outpatient prescriptions: Acetylcysteine (NAC) 600 MG CAPS, Take 1 capsule by mouth 2 (two) times daily., Disp: , Rfl: ;  albuterol (PROAIR HFA) 108 (90 BASE) MCG/ACT inhaler, Inhale 2 puffs into the lungs every 6 (six) hours as needed. For shortness of breath, Disp: , Rfl: ;  ALPRAZolam (XANAX) 0.5 MG tablet, Take 1 tablet (0.5 mg total) by mouth 2 (two) times daily as needed for anxiety (Palpatations, SOB)., Disp: 30 tablet, Rfl: 1 aspirin 81 MG tablet, Take 81 mg by mouth daily. , Disp: , Rfl: ;  chlorpheniramine-HYDROcodone (TUSSIONEX PENNKINETIC ER) 10-8 MG/5ML LQCR, Take 5 mLs by mouth 2 (two) times daily., Disp: 120 mL, Rfl: 0;  Cholecalciferol (VITAMIN D) 2000 UNITS CAPS, Take 2,000 Units by mouth.  , Disp: , Rfl: ;  fexofenadine (ALLEGRA) 180 MG tablet, Take 180 mg by mouth as needed. , Disp: , Rfl:  ibuprofen (ADVIL,MOTRIN) 200 MG tablet, Take 400 mg by mouth every 6 (six) hours as needed. For pain, Disp: , Rfl: ;  ipratropium-albuterol (DUONEB) 0.5-2.5 (3) MG/3ML SOLN, Inhale 3 mLs into the lungs 4 (four) times daily as needed., Disp: 360 mL, Rfl: 5;  mometasone (ASMANEX 60 METERED DOSES) 220 MCG/INH inhaler, USE 2 INHALATIONS ORALLY EVERY EVENING, Disp: 3 Inhaler, Rfl: 1 montelukast (SINGULAIR) 10 MG tablet, TAKE 1 TABLET BY MOUTH TWICE DAILY, Disp: 180 tablet, Rfl: 3;  Omeprazole-Sodium Bicarbonate (ZEGERID) 20-1100 MG CAPS capsule, Take 1 capsule by mouth daily before breakfast., Disp: 90 capsule, Rfl: 1;  predniSONE (DELTASONE) 10 MG tablet, Take 1  tablet (10 mg total) by mouth daily with breakfast., Disp: 30 tablet, Rfl: 1 ranitidine (ZANTAC) 300 MG tablet, Take 1 tablet (300 mg total) by mouth at bedtime., Disp: 90 tablet, Rfl: 1;  tiotropium (SPIRIVA HANDIHALER) 18 MCG inhalation capsule, INHALE CONTENTS OF 1 CAPSULE THROUGH HANDIHALER DEVICE EVERY DAY, Disp: 90 capsule, Rfl: 3;  triamcinolone (NASACORT AQ) 55 MCG/ACT AERO nasal  inhaler, Place 2 sprays into the nose as needed. , Disp: , Rfl:  venlafaxine XR (EFFEXOR-XR) 150 MG 24 hr capsule, Take 1 capsule (150 mg total) by mouth daily with breakfast., Disp: 90 capsule, Rfl: 2     Objective:   Physical Exam  Filed Vitals:   05/19/14 1137  BP: 138/90  Pulse: 81  Height: 5\' 2"  (1.575 m)  Weight: 171 lb (77.565 kg)  SpO2: 98%   Vitals reviewed. Constitutional: She is oriented to person, place, and time. She appears well-developed and well-nourished. No distress.  cushingoid  HENT:  Head: Normocephalic and atraumatic.  Right Ear: External ear normal.  Left Ear: External ear normal.  Mouth/Throat: Oropharynx is clear and moist. No oropharyngeal exudate.  Eyes: Conjunctivae and EOM are normal. Pupils are equal, round, and reactive to light. Right eye exhibits no discharge. Left eye exhibits no discharge. No scleral icterus.  Neck: Normal range of motion. Neck supple. No JVD present. No tracheal deviation present. No thyromegaly present.  Cardiovascular: Normal rate, regular rhythm, normal heart sounds and intact distal pulses.  Exam reveals no gallop and no friction rub.   No murmur heard. Pulmonary/Chest: Effort normal and breath sounds normal. No respiratory distress. She has no wheezes. She has no rales. She exhibits no tenderness.  Abdominal: Soft. Bowel sounds are normal. She exhibits no distension and no mass. There is no tenderness. There is no rebound and no guarding.  Musculoskeletal: Normal range of motion. She exhibits no edema and no tenderness.  Lymphadenopathy:    She has no cervical adenopathy.  Neurological: She is alert and oriented to person, place, and time. She has normal reflexes. No cranial nerve deficit. She exhibits normal muscle tone. Coordination normal.  Skin: Skin is warm and dry. No rash noted. She is not diaphoretic. No erythema. No pallor.  Psychiatric: She has a normal mood and affect. Her behavior is normal. Judgment and thought  content normal.         Assessment & Plan:     ICD-9-CM ICD-10-CM   1. A B P A-ALLERGIC BRONCHOPULMONARY ASPERGILLOSIS 518.6 B44.81   2. Asthma, severe persistent, uncomplicated 528.41 L24.40   3. Gastroesophageal reflux disease, esophagitis presence not specified 530.81 K21.9    #Asthma + ABPA with bronchiectassis - you are again in flare up - complete prednisne as self-directed - IT is possible hiatal hernia is playing a role  - continue all asthma medications as before - CT chest wo  Contrast - assess bronchiectasis - will call with results to decide next step which could include [facility] v[facility]atal hernia  #Followup  - 3 months  - spirometry at followup

## 2014-05-19 NOTE — Patient Instructions (Addendum)
#  Asthma + ABPA with bronchiectassis - you are again in flare up - IT is possible hiatal hernia is playing a role  - continue all asthma medications as before - CT chest wo  Contrast - assess bronchiectasis - will call with results to decide next step which could include [facility] referral v eval of hiatal hernia  #Followup  - 3 months  - spirometry at followup

## 2014-05-20 ENCOUNTER — Encounter: Payer: Self-pay | Admitting: Internal Medicine

## 2014-05-22 ENCOUNTER — Ambulatory Visit (INDEPENDENT_AMBULATORY_CARE_PROVIDER_SITE_OTHER)
Admission: RE | Admit: 2014-05-22 | Discharge: 2014-05-22 | Disposition: A | Discharge status: Home / Self Care | Payer: Managed Care, Other (non HMO) | Source: Ambulatory Visit | Attending: Internal Medicine | Admitting: Internal Medicine

## 2014-05-22 DIAGNOSIS — J4550 Severe persistent asthma, uncomplicated: Secondary | ICD-10-CM

## 2014-05-22 DIAGNOSIS — B4481 Allergic bronchopulmonary aspergillosis: Secondary | ICD-10-CM

## 2014-05-27 ENCOUNTER — Ambulatory Visit (INDEPENDENT_AMBULATORY_CARE_PROVIDER_SITE_OTHER): Payer: Managed Care, Other (non HMO) | Admitting: Internal Medicine

## 2014-05-27 ENCOUNTER — Encounter: Payer: Self-pay | Admitting: Internal Medicine

## 2014-05-27 ENCOUNTER — Other Ambulatory Visit: Payer: Managed Care, Other (non HMO)

## 2014-05-27 VITALS — BP 116/70 | HR 84 | Ht 62.0 in | Wt 169.4 lb

## 2014-05-27 DIAGNOSIS — R0600 Dyspnea, unspecified: Secondary | ICD-10-CM

## 2014-05-27 DIAGNOSIS — K219 Gastro-esophageal reflux disease without esophagitis: Secondary | ICD-10-CM

## 2014-05-27 DIAGNOSIS — K449 Diaphragmatic hernia without obstruction or gangrene: Secondary | ICD-10-CM

## 2014-05-27 DIAGNOSIS — J45901 Unspecified asthma with (acute) exacerbation: Secondary | ICD-10-CM

## 2014-05-27 MED ORDER — PANTOPRAZOLE SODIUM 40 MG PO TBEC: 40.0000 mg | DELAYED_RELEASE_TABLET | Freq: Two times a day (BID) | ORAL | Status: DC

## 2014-05-27 NOTE — Progress Notes (Signed)
Subjective:    Patient ID: Victoria [name], female    DOB: Oct 18, 1955, 58 y.o.   MRN: [id number]  HPI Victoria [name] is a 58 year old female with past medical history of GERD, hiatal hernia, IBS, pulmonary aspergillosis, bronchiectasis who is seen in follow-up after endoscopy. She was seen by Nicoletta Ba on 03/21/2014 to discuss frequent episodes of regurgitation. She was also having some episodes of dysphagia feeling like she had to force food down or vomit food back up. She also was having occasional subxiphoid chest pressure which she attributed to her esophagus. Prior cardiac evaluation have been negative. She has been taking Zegerid in the morning and ranitidine 300 mg in the evening. She came for upper endoscopy which was performed on 04/16/2014. This revealed a Schatzki's ring 32 cm from the incisors. This was dilated to 18 mm. This was at the top of a large hiatal hernia measuring 6 cm. The mucosa of the stomach appeared normal as did the duodenal bulb and second portion.  She reports that her dysphagia and regurgitation of solid food has improved after dilation. She did have several weeks of worsening heartburn after the dilation. Food is going down better at this point. Appetite has been good. She does occasionally have the subxiphoid pressure and she reports getting full quickly but then being hungry 2 hours later.  She has been treated for aspergillosis. Recently she has had ongoing issues with dyspnea without wheezing. Dr. Chase Caller recently performed a CT chest which was stable and has requested a d-dimer and referral to [facility].  Review of Systems As per history of present illness, otherwise negative  Current Medications, Allergies, Past Medical History, Past Surgical History, Family History and Social History were reviewed in [employer] record.     Objective:   Physical Exam BP 116/70 mmHg  Pulse 84  Ht 5\' 2"  (1.575 m)  Wt 169 lb 6.4 oz (76.839 kg)  BMI 30.98  kg/m2 Constitutional: Well-developed and well-nourished. No distress. HEENT: Normocephalic and atraumatic. Oropharynx is clear and moist. No oropharyngeal exudate. Conjunctivae are normal.  No scleral icterus. Neck: Neck supple. Trachea midline. Cardiovascular: Normal rate, regular rhythm and intact distal pulses. No M/R/G Pulmonary/chest: Effort normal and breath sounds normal. No wheezing, rales or rhonchi. Abdominal: Soft, nontender, nondistended. Bowel sounds active throughout.  Extremities: no clubbing, cyanosis, or edema Neurological: Alert and oriented to person place and time. Skin: Skin is warm and dry. No rashes noted. Psychiatric: Normal mood and affect. Behavior is normal.  CT chest reviewed -- hiatal hernia noted to be small also with a left diaphragmatic hernia      Assessment & Plan:  58 year old female with past medical history of GERD, hiatal hernia, IBS, pulmonary aspergillosis, bronchiectasis who is seen in follow-up after endoscopy.   1. GERD/hiatal hernia -- she would prefer 1 medication for heartburn and reflux control rather than 2. With this in mind we will begin pantoprazole 40 mg twice daily, 30 minutes before breakfast and dinner. We'll discontinue Zegerid. Ranitidine can still be used for breakthrough heartburn only as needed.  I do think some of her symptoms regarding pressure in the epigastrium are related to her large hiatal hernia. We discussed possible hiatal hernia repair but we will try to manage symptoms medically for now. She understands and is in agreement with this plan. Dysphagia improved after dilation. This can be repeated on an as-needed basis  2. Dyspnea -- followed by pulmonary. He has requested d-dimer, I will order this today  and send results to him. It seems she is being referred to [facility] as well for a second opinion  3. CRC screening -- colon 2012, no polyps, repeat 2022  OV in 3 months

## 2014-05-27 NOTE — Patient Instructions (Addendum)
Stop the zegerid Use Zantac for breakthrough Victoria [name] We have sent the following medications to your pharmacy for you to pick up at your convenience: Pantoprazole Follow up in 3 months with Dr Hilarie Fredrickson CC:  [name] MD

## 2014-05-27 NOTE — Telephone Encounter (Signed)
Let Ms More know that on CT  - bronchiectasis some better   - hiatal hernia better  Do not know what is causing her more dyspneic v aasthma being out of control  Recommend  - [facility] referral  - d-dimer rule out DVT/PE - if positive, will get VQ scan and duplex LE Thanks  Dr. Brand Males, M.D., [facility].C.P Pulmonary and Critical Care Medicine Staff Physician [facility] Pager: [phone], If no answer or between  15:00h - 7:00h: call [phone]  05/27/2014 6:01 AM

## 2014-05-28 ENCOUNTER — Encounter: Payer: Self-pay | Admitting: *Deleted

## 2014-05-28 LAB — D-DIMER, QUANTITATIVE: D-Dimer, Quant: 0.54 ug/mL-FEU — ABNORMAL HIGH (ref 0.00–0.48)

## 2014-05-29 ENCOUNTER — Telehealth: Payer: Self-pay | Admitting: Internal Medicine

## 2014-05-29 DIAGNOSIS — R0600 Dyspnea, unspecified: Secondary | ICD-10-CM

## 2014-05-29 NOTE — Telephone Encounter (Signed)
Patient returning call. [phone]

## 2014-05-29 NOTE — Telephone Encounter (Signed)
Called and spoke to pt. Informed pt of the results and recs per MR. Orders placed. Pt verbalized understanding and denied any further questions or concerns at this time.   PCC's please advise.

## 2014-05-29 NOTE — Telephone Encounter (Signed)
lmtcb for pt.  

## 2014-05-29 NOTE — Telephone Encounter (Signed)
D-dimer high somehwat  Please  Order VQ scan w0th CXR 2 view - rule out PE   Dr. Brand Males, M.D., [facility].C.P Pulmonary and Critical Care Medicine Staff Physician [place] [facility] Pager: [phone], If no answer or between  15:00h - 7:00h: call [phone]  05/29/2014 4:17 AM

## 2014-05-30 ENCOUNTER — Encounter (HOSPITAL_COMMUNITY)
Admission: RE | Admit: 2014-05-30 | Discharge: 2014-05-30 | Disposition: A | Discharge status: Home / Self Care | Payer: Managed Care, Other (non HMO) | Source: Ambulatory Visit | Attending: Internal Medicine | Admitting: Internal Medicine

## 2014-05-30 ENCOUNTER — Ambulatory Visit (HOSPITAL_COMMUNITY)
Admission: RE | Admit: 2014-05-30 | Discharge: 2014-05-30 | Disposition: A | Discharge status: Home / Self Care | Payer: Managed Care, Other (non HMO) | Source: Ambulatory Visit | Attending: Internal Medicine | Admitting: Internal Medicine

## 2014-05-30 ENCOUNTER — Telehealth: Payer: Self-pay | Admitting: Internal Medicine

## 2014-05-30 DIAGNOSIS — R0600 Dyspnea, unspecified: Secondary | ICD-10-CM

## 2014-05-30 DIAGNOSIS — K449 Diaphragmatic hernia without obstruction or gangrene: Secondary | ICD-10-CM | POA: Diagnosis not present

## 2014-05-30 DIAGNOSIS — R0602 Shortness of breath: Secondary | ICD-10-CM | POA: Insufficient documentation

## 2014-05-30 DIAGNOSIS — R079 Chest pain, unspecified: Secondary | ICD-10-CM | POA: Insufficient documentation

## 2014-05-30 MED ORDER — TECHNETIUM TC 99M DIETHYLENETRIAME-PENTAACETIC ACID: 44.0000 | Freq: Once | INTRAVENOUS | Status: AC | PRN

## 2014-05-30 MED ORDER — TECHNETIUM TO 99M ALBUMIN AGGREGATED
5.1000 | Freq: Once | INTRAVENOUS | Status: AC | PRN
  Administered 2014-05-30: 5 via INTRAVENOUS

## 2014-05-30 NOTE — Telephone Encounter (Signed)
I spoke with patient about results and she verbalized understanding and had no questions 

## 2014-05-30 NOTE — Telephone Encounter (Signed)
LEt Victoria [name]  Know that VQ scan does NOT show any acute PE   Thanks  Dr. Brand Males, M.D., [facility].C.P Pulmonary and Critical Care Medicine Staff Physician [place] [facility] Pager: [phone], If no answer or between  15:00h - 7:00h: call [phone]  05/30/2014 5:21 PM     Dg Chest 2 View  05/30/2014   CLINICAL DATA:  Shortness of breath.  Chest pain.  Dyspnea.  EXAM: CHEST  2 VIEW  COMPARISON:  CT chest 05/22/2014.  FINDINGS: The heart size is normal. Biapical pleural parenchymal scarring is again noted. The lungs are clear. A posterior diaphragmatic hernia on the left is stable. A small hiatal hernia is stable. Exaggerated thoracic kyphosis is stable.  IMPRESSION: 1. No acute cardiopulmonary disease or significant interval change. 2. Posterior left diaphragmatic hernia. 3. Small hiatal hernia.   Electronically Signed   By: Lawrence Santiago M.D.   On: 05/30/2014 10:37   Nm Pulmonary Per & Vent  05/30/2014   CLINICAL DATA:  Elevated D-dimer, labored breathing. Short of breath.  EXAM: NUCLEAR MEDICINE VENTILATION - PERFUSION LUNG SCAN  TECHNIQUE: Ventilation images were obtained in multiple projections using inhaled aerosol technetium 99 M DTPA. Perfusion images were obtained in multiple projections after intravenous injection of Tc-39m MAA.  RADIOPHARMACEUTICALS:  Forty-four mCi Tc-32m DTPA aerosol and 5.1 mCi Tc-5m MAA  COMPARISON:  Chest radiograph 05/30/2014, chest CT 05/22/2014  FINDINGS: Ventilation: There is a heterogeneous ventilation pattern with some defects peripherally. Activity noted within the stomach. Rounded at the left lung base medially.  Perfusion: There are small scalloped peripheral perfusion defects in the left and right lung which for the most part match the ventilation defects. No large wedge-shaped profusion defects noted. Rounded defect at the medial left lung base which corresponds to a diaphragmatic hernia seen on  comparison CT.  IMPRESSION: 1. Small profusion defects which for most part match the ventilation defects. Differential would include chronic pulmonary emboli versus obstructive pulmonary disease. 2. No evidence of acute pulmonary embolism. 3. Diaphragmatic hernia noted at the left lung base.   Electronically Signed   By: Suzy Bouchard M.D.   On: 05/30/2014 11:33

## 2014-06-05 ENCOUNTER — Telehealth: Payer: Self-pay | Admitting: *Deleted

## 2014-06-05 NOTE — Telephone Encounter (Signed)
Patient's insurance company has denied twice daily pantoprazole rx for patient. Patient has tried Zegerid and H2 antagonist in the past. I have spoken to patient to advise her of this. She will find out which PPI's her insurance covers and will call us back so we can choose one of those medications in place of pantoprazole.

## 2014-06-24 ENCOUNTER — Ambulatory Visit: Payer: Managed Care, Other (non HMO) | Admitting: Internal Medicine

## 2014-07-21 ENCOUNTER — Other Ambulatory Visit: Payer: Self-pay | Admitting: Family Medicine

## 2014-07-21 NOTE — Telephone Encounter (Signed)
Rx refused change in therapy.//AB/CMA

## 2014-07-23 ENCOUNTER — Encounter: Payer: Self-pay | Admitting: Family Medicine

## 2014-07-24 MED ORDER — VENLAFAXINE HCL ER 37.5 MG PO CP24: 37.5000 mg | ORAL_CAPSULE | Freq: Three times a day (TID) | ORAL | Status: DC

## 2014-07-24 NOTE — Telephone Encounter (Signed)
Rx sent to the pharmacy by e-script.//AB/CMA    OK to d/c Venlafaxine 150 and start 37.5 mg tabs tid, disp #180 with 1 rf to [employer] as patient requests and let her know. Dr Jacinto Reap

## 2014-07-28 ENCOUNTER — Other Ambulatory Visit: Payer: Self-pay

## 2014-07-28 DIAGNOSIS — F329 Major depressive disorder, single episode, unspecified: Secondary | ICD-10-CM

## 2014-07-28 DIAGNOSIS — F419 Anxiety disorder, unspecified: Principal | ICD-10-CM

## 2014-07-29 ENCOUNTER — Ambulatory Visit (INDEPENDENT_AMBULATORY_CARE_PROVIDER_SITE_OTHER): Payer: Managed Care, Other (non HMO) | Admitting: Family Medicine

## 2014-07-29 ENCOUNTER — Encounter: Payer: Self-pay | Admitting: Family Medicine

## 2014-07-29 VITALS — BP 106/74 | HR 78 | Temp 98.6°F | Ht 62.0 in | Wt 169.5 lb

## 2014-07-29 DIAGNOSIS — E669 Obesity, unspecified: Secondary | ICD-10-CM

## 2014-07-29 DIAGNOSIS — M25579 Pain in unspecified ankle and joints of unspecified foot: Secondary | ICD-10-CM

## 2014-07-29 DIAGNOSIS — R002 Palpitations: Secondary | ICD-10-CM

## 2014-07-29 MED ORDER — FUROSEMIDE 20 MG PO TABS: 20.0000 mg | ORAL_TABLET | Freq: Every day | ORAL | Status: DC | PRN

## 2014-07-29 MED ORDER — ALPRAZOLAM 0.5 MG PO TABS: 0.5000 mg | ORAL_TABLET | Freq: Two times a day (BID) | ORAL | Status: DC | PRN

## 2014-07-29 NOTE — Telephone Encounter (Signed)
Faxed hardcopy for Alprazolam to [facility]

## 2014-07-29 NOTE — Progress Notes (Signed)
Pre visit review using our clinic review tool, if applicable. No additional management support is needed unless otherwise documented below in the visit note. 

## 2014-07-29 NOTE — Patient Instructions (Addendum)
Jobst stockings 10-20 mmhg. Can order on line Capsaicin cream D Scholl's shoe inserts, call for referral to sports med if no better   Rel of rec [place] orho? xrays feet and knees 7/10/14Peripheral Neuropathy Peripheral neuropathy is a type of nerve damage. It affects nerves that carry signals between the spinal cord and other parts of the body. These are called peripheral nerves. With peripheral neuropathy, one nerve or a group of nerves may be damaged.  CAUSES  Many things can damage peripheral nerves. For some people with peripheral neuropathy, the cause is unknown. Some causes include:  Diabetes. This is the most common cause of peripheral neuropathy.  Injury to a nerve.  Pressure or stress on a nerve that lasts a long time.  Too little vitamin B. Alcoholism can lead to this.  Infections.  Autoimmune diseases, such as multiple sclerosis and systemic lupus erythematosus.  Inherited nerve diseases.  Some medicines, such as cancer drugs.  Toxic substances, such as lead and mercury.  Too little blood flowing to the legs.  Kidney disease.  Thyroid disease. SIGNS AND SYMPTOMS  Different people have different symptoms. The symptoms you have will depend on which of your nerves is damaged. Common symptoms include:  Loss of feeling (numbness) in the feet and hands.  Tingling in the feet and hands.  Pain that burns.  Very sensitive skin.  Weakness.  Not being able to move a part of the body (paralysis).  Muscle twitching.  Clumsiness or poor coordination.  Loss of balance.  Not being able to control your bladder.  Feeling dizzy.  Sexual problems. DIAGNOSIS  Peripheral neuropathy is a symptom, not a disease. Finding the cause of peripheral neuropathy can be hard. To figure that out, your health care provider will take a medical history and do a physical exam. A neurological exam will also be done. This involves checking things affected by your brain, spinal cord,  and nerves (nervous system). For example, your health care provider will check your reflexes, how you move, and what you can feel.  Other types of tests may also be ordered, such as:  Blood tests.  A test of the fluid in your spinal cord.  Imaging tests, such as CT scans or an MRI.  Electromyography (EMG). This test checks the nerves that control muscles.  Nerve conduction velocity tests. These tests check how fast messages pass through your nerves.  Nerve biopsy. A small piece of nerve is removed. It is then checked under a microscope. TREATMENT   Medicine is often used to treat peripheral neuropathy. Medicines may include:  Pain-relieving medicines. Prescription or over-the-counter medicine may be suggested.  Antiseizure medicine. This may be used for pain.  Antidepressants. These also may help ease pain from neuropathy.  Lidocaine. This is a numbing medicine. You might wear a patch or be given a shot.  Mexiletine. This medicine is typically used to help control irregular heart rhythms.  Surgery. Surgery may be needed to relieve pressure on a nerve or to destroy a nerve that is causing pain.  Physical therapy to help movement.  Assistive devices to help movement. HOME CARE INSTRUCTIONS   Only take over-the-counter or prescription medicines as directed by your health care provider. Follow the instructions carefully for any given medicines. Do not take any other medicines without first getting approval from your health care provider.  If you have diabetes, work closely with your health care provider to keep your blood sugar under control.  If you have numbness in  your feet:  Check every day for signs of injury or infection. Watch for redness, warmth, and swelling.  Wear padded socks and comfortable shoes. These help protect your feet.  Do not do things that put pressure on your damaged nerve.  Do not smoke. Smoking keeps blood from getting to damaged nerves.  Avoid or  limit alcohol. Too much alcohol can cause a lack of B vitamins. These vitamins are needed for healthy nerves.  Develop a good support system. Coping with peripheral neuropathy can be stressful. Talk to a mental health specialist or join a support group if you are struggling.  Follow up with your health care provider as directed. SEEK MEDICAL CARE IF:   You have new signs or symptoms of peripheral neuropathy.  You are struggling emotionally from dealing with peripheral neuropathy.  You have a fever. SEEK IMMEDIATE MEDICAL CARE IF:   You have an injury or infection that is not healing.  You feel very dizzy or begin vomiting.  You have chest pain.  You have trouble breathing. Document Released: 05/27/2002 Document Revised: 02/16/2011 Document Reviewed: 02/11/2013 [facility] Patient Information 2015 [place], [place]. This information is not intended to replace advice given to you by your health care provider. Make sure you discuss any questions you have with your health care provider.

## 2014-08-10 ENCOUNTER — Encounter: Payer: Self-pay | Admitting: Family Medicine

## 2014-08-10 DIAGNOSIS — M25579 Pain in unspecified ankle and joints of unspecified foot: Secondary | ICD-10-CM | POA: Insufficient documentation

## 2014-08-10 NOTE — Progress Notes (Signed)
Victoria [name]  [id number] 01-19-1956 08/10/2014      Progress Note-Follow Up  Subjective  Chief Complaint  Chief Complaint  Patient presents with  . Follow-up    feet burning and legs swelling    HPI  [name] in today for routine medical care. Patient in today to discuss pain in her feet. She's had trouble with her right foot for many years but more recently both feet are bothering her pain initially was along the top of the right foot but is now generalized across the whole foot. She denies any recent injury. No change in skin color or warmth. She is also complaining of intermittent flares in anxiety and some mild palpitations infrequently. No other acute concerns or illness. Denies CP/palp/SOB/HA/congestion/fevers/GI or GU c/o. Taking meds as prescribed  Past Medical History  Diagnosis Date  . Asthma   . Obesity   . Maxillary sinusitis   . Osteoarthritis   . IBS (irritable bowel syndrome)   . Hyperlipidemia   . COPD (chronic obstructive pulmonary disease)   . Pulmonary nodules   . Osteoporosis   . Nasal congestion 9/12    current cold  . Cough   . Wheezing   . GERD (gastroesophageal reflux disease)   . H/O hiatal hernia   . Depression     mild  . Allergic bronchopulmonary aspergillosis 2008    sees Dr Edmund Hilda pulmonology  . Anemia     iron deficiency, resolved  . Normal cardiac stress test 11/2011  . OSA (obstructive sleep apnea) 02/2012    has stopped using  cpap  . Anginal pain   . Chronic bronchitis   . Exertional dyspnea     "sometimes lying down; always w/exertion" (04/13/2012)  . Headache(784.0)     "often but not daily" (04/13/2012)  . Migraines   . Anxiety   . Pneumonia 11/2011    "before 2013 I hadn't had pneumonia since I was a child" (04/13/2012)  . Anxiety state, unspecified 08/18/2013  . Schatzki's ring   . Pain in joint, ankle and foot 08/10/2014    Past Surgical History  Procedure Laterality Date  . Cesarean  section  1985  . Hernia repair  04/13/2012    VHR laparoscopic  . Appendectomy  1989  . Ventral hernia repair  04/13/2012    Procedure: LAPAROSCOPIC VENTRAL HERNIA;  Surgeon: Adin Hector, MD;  Location: [place];  Service: General;  Laterality: N/A;  laparoscopic repair of incarcerated hernia    Family History  Problem Relation Age of Onset  . Breast cancer Mother   . Prostate cancer Father   . Diverticulosis Father   . Prostate cancer Father     prostate  . Pulmonary embolism Brother     recurrent  . Breast cancer Sister     breast    History   Social History  . Marital Status: Married    Spouse Name: N/A  . Number of Children: 1  . Years of Education: N/A   Occupational History  .     Social History Main Topics  . Smoking status: Never Smoker   . Smokeless tobacco: Never Used  . Alcohol Use: 2.4 oz/week    4 Glasses of wine per week     Comment: 04/13/2012 "beer in the summer; wine 3-4 times/wk"  . Drug Use: No  . Sexual Activity: Yes     Comment: gluten free, lives with husband and son with CP quadriplegia  Other Topics Concern  . Not on file   Social History Narrative    Current Outpatient Prescriptions on File Prior to Visit  Medication Sig Dispense Refill  . Acetylcysteine (NAC) 600 MG CAPS Take 1 capsule by mouth 2 (two) times daily.    Marland Kitchen albuterol (PROAIR HFA) 108 (90 BASE) MCG/ACT inhaler Inhale 2 puffs into the lungs every 6 (six) hours as needed. For shortness of breath    . ALPRAZolam (XANAX) 0.5 MG tablet Take 1 tablet (0.5 mg total) by mouth 2 (two) times daily as needed for anxiety (Palpatations, SOB). 30 tablet 1  . aspirin 81 MG tablet Take 81 mg by mouth daily.     . Cholecalciferol (VITAMIN D) 2000 UNITS CAPS Take 2,000 Units by mouth.      . fexofenadine (ALLEGRA) 180 MG tablet Take 180 mg by mouth as needed.     Marland Kitchen ibuprofen (ADVIL,MOTRIN) 200 MG tablet Take 400 mg by mouth every 6 (six) hours as needed. For pain    .  ipratropium-albuterol (DUONEB) 0.5-2.5 (3) MG/3ML SOLN Inhale 3 mLs into the lungs 4 (four) times daily as needed. 360 mL 5  . mometasone (ASMANEX 60 METERED DOSES) 220 MCG/INH inhaler USE 2 INHALATIONS ORALLY EVERY EVENING 3 Inhaler 1  . montelukast (SINGULAIR) 10 MG tablet TAKE 1 TABLET BY MOUTH TWICE DAILY 180 tablet 3  . Omeprazole-Sodium Bicarbonate (ZEGERID) 20-1100 MG CAPS capsule Take 1 capsule by mouth daily before breakfast. 90 capsule 1  . pantoprazole (PROTONIX) 40 MG tablet Take 1 tablet (40 mg total) by mouth 2 (two) times daily before a meal. 180 tablet 3  . predniSONE (DELTASONE) 10 MG tablet Take 1 tablet (10 mg total) by mouth daily with breakfast. 30 tablet 1  . ranitidine (ZANTAC) 300 MG tablet Take 1 tablet (300 mg total) by mouth at bedtime. 90 tablet 1  . tiotropium (SPIRIVA HANDIHALER) 18 MCG inhalation capsule INHALE CONTENTS OF 1 CAPSULE THROUGH HANDIHALER DEVICE EVERY DAY 90 capsule 3  . triamcinolone (NASACORT AQ) 55 MCG/ACT AERO nasal inhaler Place 2 sprays into the nose as needed.     . venlafaxine XR (EFFEXOR XR) 37.5 MG 24 hr capsule Take 1 capsule (37.5 mg total) by mouth 3 (three) times daily. 180 capsule 1   No current facility-administered medications on file prior to visit.    Allergies  Allergen Reactions  . Beclomethasone Dipropionate Hives and Other (See Comments)     weight gain  . Budesonide-Formoterol Fumarate Hives  . Sulfonamide Derivatives Hives and Rash  . Mometasone Furo-Formoterol Fum Hives and Other (See Comments)    weight gain  . Statins     Myalgias, RLS    Review of Systems  Review of Systems  Constitutional: Positive for malaise/fatigue. Negative for fever.  HENT: Negative for congestion.   Eyes: Negative for discharge.  Respiratory: Negative for shortness of breath.   Cardiovascular: Positive for palpitations. Negative for chest pain and leg swelling.  Gastrointestinal: Negative for nausea, abdominal pain and diarrhea.    Genitourinary: Negative for dysuria.  Musculoskeletal: Positive for joint pain. Negative for falls.  Skin: Negative for rash.  Neurological: Negative for loss of consciousness and headaches.  Endo/Heme/Allergies: Negative for polydipsia.  Psychiatric/Behavioral: Negative for depression and suicidal ideas. The patient is not nervous/anxious and does not have insomnia.     Objective  BP 106/74 mmHg  Pulse 78  Temp(Src) 98.6 F (37 C) (Oral)  Ht 5\' 2"  (1.575 m)  Wt 169 lb 8 oz (  76.885 kg)  BMI 30.99 kg/m2  SpO2 91%  Physical Exam  Physical Exam  Constitutional: She is oriented to person, place, and time and well-developed, well-nourished, and in no distress. No distress.  HENT:  Head: Normocephalic and atraumatic.  Eyes: Conjunctivae are normal.  Neck: Neck supple. No thyromegaly present.  Cardiovascular: Normal rate, regular rhythm and normal heart sounds.   No murmur heard. Pulmonary/Chest: Effort normal and breath sounds normal. She has no wheezes.  Abdominal: She exhibits no distension and no mass.  Musculoskeletal: She exhibits no edema.  Flat feet and inverted ankles.   Lymphadenopathy:    She has no cervical adenopathy.  Neurological: She is alert and oriented to person, place, and time.  Skin: Skin is warm and dry. No rash noted. She is not diaphoretic.  Psychiatric: Memory, affect and judgment normal.    Lab Results  Component Value Date   TSH 2.351 08/12/2013   Lab Results  Component Value Date   WBC 10.3 07/27/2013   HGB 14.4 07/27/2013   HCT 42.7 07/27/2013   MCV 91.0 07/27/2013   PLT 249 07/27/2013   Lab Results  Component Value Date   CREATININE 0.67 07/27/2013   BUN 14 07/27/2013   NA 139 07/27/2013   K 3.5* 07/27/2013   CL 100 07/27/2013   CO2 24 07/27/2013   Lab Results  Component Value Date   ALT 30 11/02/2012   AST 23 11/02/2012   ALKPHOS 90 11/02/2012   BILITOT 0.3 11/02/2012   Lab Results  Component Value Date   CHOL 186  09/04/2012   Lab Results  Component Value Date   HDL 43.30 09/04/2012   Lab Results  Component Value Date   LDLCALC 125* 09/04/2012   Lab Results  Component Value Date   TRIG 90.0 09/04/2012   Lab Results  Component Value Date   CHOLHDL 4 09/04/2012     Assessment & Plan  Obesity Encouraged DASH diet, decrease po intake and increase exercise as tolerated. Needs 7-8 hours of sleep nightly. Avoid trans fats, eat small, frequent meals every 4-5 hours with lean proteins, complex carbs and healthy fats. Minimize simple carbs, GMO foods.   PALPITATIONS Infrequent but do occur when stressed. Good response to Alprazolam allowed refill today   Pain in joint, ankle and foot R>L with fallen arches and burning in feet. Encouraged orthotics, high quality shoes, stretching and topical treatments. Consider custom orthotics and gabapentin if worsens.

## 2014-08-10 NOTE — Assessment & Plan Note (Signed)
Encouraged DASH diet, decrease po intake and increase exercise as tolerated. Needs 7-8 hours of sleep nightly. Avoid trans fats, eat small, frequent meals every 4-5 hours with lean proteins, complex carbs and healthy fats. Minimize simple carbs, GMO foods. 

## 2014-08-10 NOTE — Assessment & Plan Note (Signed)
Infrequent but do occur when stressed. Good response to Alprazolam allowed refill today

## 2014-08-10 NOTE — Assessment & Plan Note (Signed)
R>L with fallen arches and burning in feet. Encouraged orthotics, high quality shoes, stretching and topical treatments. Consider custom orthotics and gabapentin if worsens.

## 2014-09-02 ENCOUNTER — Telehealth: Payer: Self-pay | Admitting: Family Medicine

## 2014-09-02 NOTE — Telephone Encounter (Signed)
Patient Name: Victoria [name] DOB: [birth year] Initial Comment Caller states HR fast and BP high , 144/81, HR 95 , she is normally low. Pulse ox is 98, Heart Palpitations and feeling more fatigue Nurse Assessment Nurse: Marcelline Deist, RN, Kermit Balo Date/Time (Eastern Time): 09/02/2014 1:42:10 PM Confirm and document reason for call. If symptomatic, describe symptoms. ---Caller states her heart rate is fast and BP high @144 /81, HR 95 , she is normally lower than that. Pulse ox is 98, Heart palpitations and feeling more fatigue. States she just doesn't look right, like she has aged. HR now 92, BP 136/89. Has the patient traveled out of the country within the last 30 days? ---Not Applicable Does the patient require triage? ---Yes Related visit to physician within the last 2 weeks? ---No Does the PT have any chronic conditions? (i.e. diabetes, asthma, etc.) ---Yes List chronic conditions. ---thyroid issues, high cholesterol, severe asthma Guidelines Guideline Title Affirmed Question Affirmed Notes Heart Rate and Heartbeat Questions [1] Palpitations AND [2] no improvement after following Care Advice Final Disposition User See PCP When Office is Open (within 3 days) Marcelline Deist, RN, Kermit Balo Comments After triage, caller reports her BP has come down significantly. She is still concerned about the palpitations & elevated BP & HR from earlier today.

## 2014-09-02 NOTE — Telephone Encounter (Signed)
Patient has appointment with Dr. Larose Kells on 3/16 at [facility].

## 2014-09-03 ENCOUNTER — Ambulatory Visit (INDEPENDENT_AMBULATORY_CARE_PROVIDER_SITE_OTHER): Payer: Managed Care, Other (non HMO) | Admitting: Internal Medicine

## 2014-09-03 ENCOUNTER — Encounter: Payer: Self-pay | Admitting: Internal Medicine

## 2014-09-03 VITALS — BP 118/72 | HR 93 | Temp 98.1°F | Ht 62.0 in | Wt 169.0 lb

## 2014-09-03 DIAGNOSIS — R5382 Chronic fatigue, unspecified: Secondary | ICD-10-CM

## 2014-09-03 DIAGNOSIS — R002 Palpitations: Secondary | ICD-10-CM

## 2014-09-03 DIAGNOSIS — F329 Major depressive disorder, single episode, unspecified: Secondary | ICD-10-CM

## 2014-09-03 DIAGNOSIS — F32A Depression, unspecified: Secondary | ICD-10-CM

## 2014-09-03 NOTE — Progress Notes (Signed)
Subjective:    Patient ID: Victoria [name], female    DOB: [birth year], 59 y.o.   MRN: [id number]  DOS:  09/03/2014 Type of visit - description : acute Interval history: Not feeling well for the last month, multiple symptoms:  More fatigue than usual on and off. Looks pale. "I don't feel like myself, my husband thinks I may be depressed". She self decreased the Effexor dose to 37.5 tablets 3 times a day because she read that Effexor may increase her cholesterol however she does not feel frankly depressed (no classic sx such as feeling blue, crying, etc). Has palpitations on and off, episodes may last 30 minutes, not associated nausea, diaphoresis or fainting feeling. Her heart rate during the episodes is around 90. BP slightly elevated lately up to 144.   Review of Systems Denies chest pain, difficulty breathing, lower extremity edema Asthma is under very good control No suicidal ideas No recent airplane trip or car trips.  Past Medical History  Diagnosis Date  . Asthma   . Obesity   . Maxillary sinusitis   . Osteoarthritis   . IBS (irritable bowel syndrome)   . Hyperlipidemia   . COPD (chronic obstructive pulmonary disease)   . Pulmonary nodules   . Osteoporosis   . Nasal congestion 9/12    current cold  . Cough   . Wheezing   . GERD (gastroesophageal reflux disease)   . H/O hiatal hernia   . Depression     mild  . Allergic bronchopulmonary aspergillosis 2008    sees Dr Edmund Hilda pulmonology  . Anemia     iron deficiency, resolved  . Normal cardiac stress test 11/2011  . OSA (obstructive sleep apnea) 02/2012    has stopped using  cpap  . Anginal pain   . Chronic bronchitis   . Exertional dyspnea     "sometimes lying down; always w/exertion" (04/13/2012)  . Headache(784.0)     "often but not daily" (04/13/2012)  . Migraines   . Anxiety   . Pneumonia 11/2011    "before 2013 I hadn't had pneumonia since I was a child" (04/13/2012)  . Anxiety state,  unspecified 08/18/2013  . Schatzki's ring   . Pain in joint, ankle and foot 08/10/2014    Past Surgical History  Procedure Laterality Date  . Cesarean section  1985  . Hernia repair  04/13/2012    VHR laparoscopic  . Appendectomy  1989  . Ventral hernia repair  04/13/2012    Procedure: LAPAROSCOPIC VENTRAL HERNIA;  Surgeon: Adin Hector, MD;  Location: [facility];  Service: General;  Laterality: N/A;  laparoscopic repair of incarcerated hernia    History   Social History  . Marital Status: Married    Spouse Name: N/A  . Number of Children: 1  . Years of Education: N/A   Occupational History  .     Social History Main Topics  . Smoking status: Never Smoker   . Smokeless tobacco: Never Used  . Alcohol Use: 2.4 oz/week    4 Glasses of wine per week     Comment: 04/13/2012 "beer in the summer; wine 3-4 times/wk"  . Drug Use: No  . Sexual Activity: Yes     Comment: gluten free, lives with husband and son with CP quadriplegia   Other Topics Concern  . Not on file   Social History Narrative        Medication List       This list is accurate as  of: 09/03/14 11:59 PM.  Always use your most recent med list.               ALPRAZolam 0.5 MG tablet  Commonly known as:  XANAX  Take 1 tablet (0.5 mg total) by mouth 2 (two) times daily as needed for anxiety (Palpatations, SOB).     aspirin 81 MG tablet  Take 81 mg by mouth daily.     esomeprazole 20 MG capsule  Commonly known as:  NEXIUM  Take 20 mg by mouth daily at 12 noon.     fexofenadine 180 MG tablet  Commonly known as:  ALLEGRA  Take 180 mg by mouth as needed.     furosemide 20 MG tablet  Commonly known as:  LASIX  Take 1 tablet (20 mg total) by mouth daily as needed.     ibuprofen 200 MG tablet  Commonly known as:  ADVIL,MOTRIN  Take 400 mg by mouth every 6 (six) hours as needed. For pain     ipratropium-albuterol 0.5-2.5 (3) MG/3ML Soln  Commonly known as:  DUONEB  Inhale 3 mLs into the lungs 4 (four)  times daily as needed.     mometasone 220 MCG/INH inhaler  Commonly known as:  ASMANEX 60 METERED DOSES  USE 2 INHALATIONS ORALLY EVERY EVENING     montelukast 10 MG tablet  Commonly known as:  SINGULAIR  TAKE 1 TABLET BY MOUTH TWICE DAILY     NAC 600 MG Caps  Generic drug:  Acetylcysteine  Take 1 capsule by mouth 2 (two) times daily.     NASACORT AQ 55 MCG/ACT Aero nasal inhaler  Generic drug:  triamcinolone  Place 2 sprays into the nose as needed.     PROAIR HFA 108 (90 BASE) MCG/ACT inhaler  Generic drug:  albuterol  Inhale 2 puffs into the lungs every 6 (six) hours as needed. For shortness of breath     ranitidine 300 MG tablet  Commonly known as:  ZANTAC  Take 1 tablet (300 mg total) by mouth at bedtime.     tiotropium 18 MCG inhalation capsule  Commonly known as:  SPIRIVA HANDIHALER  INHALE CONTENTS OF 1 CAPSULE THROUGH HANDIHALER DEVICE EVERY DAY     venlafaxine XR 150 MG 24 hr capsule  Commonly known as:  EFFEXOR-XR  Take 150 mg by mouth daily with breakfast.     Vitamin D 2000 UNITS Caps  Take 2,000 Units by mouth.           Objective:   Physical Exam BP 118/72 mmHg  Pulse 93  Temp(Src) 98.1 F (36.7 C) (Oral)  Ht 5\' 2"  (1.575 m)  Wt 169 lb (76.658 kg)  BMI 30.90 kg/m2  SpO2 99%  General:   Well developed, well nourished . NAD.  HEENT:  Normocephalic . Face symmetric, atraumatic; not pale Lungs:  CTA B Normal respiratory effort, no intercostal retractions, no accessory muscle use. Heart: RRR,  no murmur.  Abdomen:  Not distended, soft, non-tender. No rebound or rigidity. No mass,organomegaly Muscle skeletal: no pretibial edema bilaterally ; calves symmetric Skin: Not pale. Not jaundice Neurologic:  alert & oriented X3.  Speech normal, gait appropriate for age and unassisted Psych--  Cognition and judgment appear intact.  Cooperative with normal attention span and concentration.  Behavior appropriate. No anxious or depressed  appearing.      Assessment & Plan:    Chronic fatigue,  slightly worse. We'll check a BMP, CBC, TSH  Palpitations, not a new issue, no  red flag symptoms, EKG today satisfactory. Plan: Holter monitor, ER if symptoms severe  Depression, Apparently was doing better with Effexor 150 mg, recommend to go back to previous dose.  Follow-up with PCP in 2 or 3 weeks, call if she feels worse

## 2014-09-03 NOTE — Patient Instructions (Signed)
Get your blood work before you leave    Increase Effexor to 150 mg daily  Call if you have severe palpitations  Come back and see your primary doctor in 2 or 3 weeks

## 2014-09-03 NOTE — Progress Notes (Signed)
Pre visit review using our clinic review tool, if applicable. No additional management support is needed unless otherwise documented below in the visit note. 

## 2014-09-04 ENCOUNTER — Encounter: Payer: Self-pay | Admitting: Internal Medicine

## 2014-09-04 LAB — BASIC METABOLIC PANEL
BUN: 15 mg/dL (ref 6–23)
CO2: 28 mEq/L (ref 19–32)
Calcium: 9.4 mg/dL (ref 8.4–10.5)
Chloride: 102 mEq/L (ref 96–112)
Creatinine, Ser: 0.72 mg/dL (ref 0.40–1.20)
GFR: 88.63 mL/min (ref >60.00)
Glucose, Bld: 81 mg/dL (ref 70–99)
Potassium: 4.4 mEq/L (ref 3.5–5.1)
SODIUM: 137 meq/L (ref 135–145)

## 2014-09-04 LAB — TSH: TSH: 1.85 u[IU]/mL (ref 0.35–4.50)

## 2014-09-04 LAB — CBC WITH DIFFERENTIAL/PLATELET
Basophils Absolute: 0.1 10*3/uL (ref 0.0–0.1)
Basophils Relative: 2 % (ref 0.0–3.0)
EOS PCT: 9.3 % — AB (ref 0.0–5.0)
Eosinophils Absolute: 0.7 10*3/uL (ref 0.0–0.7)
HEMATOCRIT: 38.6 % (ref 36.0–46.0)
Hemoglobin: 12.8 g/dL (ref 12.0–15.0)
LYMPHS PCT: 23 % (ref 12.0–46.0)
Lymphs Abs: 1.7 10*3/uL (ref 0.7–4.0)
MCHC: 33 g/dL (ref 30.0–36.0)
MCV: 88.8 fl (ref 78.0–100.0)
MONO ABS: 0.4 10*3/uL (ref 0.1–1.0)
Monocytes Relative: 6 % (ref 3.0–12.0)
NEUTROS ABS: 4.3 10*3/uL (ref 1.4–7.7)
Neutrophils Relative %: 59.7 % (ref 43.0–77.0)
Platelets: 458 10*3/uL — ABNORMAL HIGH (ref 150.0–400.0)
RBC: 4.35 Mil/uL (ref 3.87–5.11)
RDW: 12.7 % (ref 11.5–15.5)
WBC: 7.2 10*3/uL (ref 4.0–10.5)

## 2014-09-10 ENCOUNTER — Encounter: Payer: Self-pay | Admitting: Family Medicine

## 2014-09-29 ENCOUNTER — Other Ambulatory Visit: Payer: Self-pay | Admitting: Family Medicine

## 2014-09-29 DIAGNOSIS — R030 Elevated blood-pressure reading, without diagnosis of hypertension: Principal | ICD-10-CM

## 2014-09-29 DIAGNOSIS — R5382 Chronic fatigue, unspecified: Secondary | ICD-10-CM

## 2014-09-29 DIAGNOSIS — IMO0001 Reserved for inherently not codable concepts without codable children: Secondary | ICD-10-CM

## 2014-10-02 ENCOUNTER — Other Ambulatory Visit (INDEPENDENT_AMBULATORY_CARE_PROVIDER_SITE_OTHER): Payer: Managed Care, Other (non HMO)

## 2014-10-02 DIAGNOSIS — IMO0001 Reserved for inherently not codable concepts without codable children: Secondary | ICD-10-CM

## 2014-10-02 DIAGNOSIS — R5382 Chronic fatigue, unspecified: Secondary | ICD-10-CM | POA: Diagnosis not present

## 2014-10-02 DIAGNOSIS — R030 Elevated blood-pressure reading, without diagnosis of hypertension: Secondary | ICD-10-CM | POA: Diagnosis not present

## 2014-10-03 LAB — T3, FREE: T3, Free: 3.2 pg/mL (ref 2.3–4.2)

## 2014-10-03 LAB — T4, FREE: Free T4: 0.84 ng/dL (ref 0.60–1.60)

## 2014-10-10 ENCOUNTER — Ambulatory Visit: Payer: Managed Care, Other (non HMO) | Admitting: Family Medicine

## 2014-11-08 ENCOUNTER — Emergency Department (HOSPITAL_BASED_OUTPATIENT_CLINIC_OR_DEPARTMENT_OTHER): Payer: Managed Care, Other (non HMO)

## 2014-11-08 ENCOUNTER — Encounter (HOSPITAL_BASED_OUTPATIENT_CLINIC_OR_DEPARTMENT_OTHER): Payer: Self-pay

## 2014-11-08 ENCOUNTER — Observation Stay (HOSPITAL_BASED_OUTPATIENT_CLINIC_OR_DEPARTMENT_OTHER)
Admission: EM | Admit: 2014-11-08 | Discharge: 2014-11-09 | Disposition: A | Discharge status: Home / Self Care | Payer: Managed Care, Other (non HMO) | Attending: Internal Medicine | Admitting: Internal Medicine

## 2014-11-08 DIAGNOSIS — Z79899 Other long term (current) drug therapy: Secondary | ICD-10-CM

## 2014-11-08 DIAGNOSIS — K589 Irritable bowel syndrome without diarrhea: Secondary | ICD-10-CM | POA: Insufficient documentation

## 2014-11-08 DIAGNOSIS — E785 Hyperlipidemia, unspecified: Secondary | ICD-10-CM | POA: Diagnosis not present

## 2014-11-08 DIAGNOSIS — G43909 Migraine, unspecified, not intractable, without status migrainosus: Secondary | ICD-10-CM | POA: Diagnosis not present

## 2014-11-08 DIAGNOSIS — R0781 Pleurodynia: Secondary | ICD-10-CM | POA: Diagnosis not present

## 2014-11-08 DIAGNOSIS — R0789 Other chest pain: Secondary | ICD-10-CM

## 2014-11-08 DIAGNOSIS — K449 Diaphragmatic hernia without obstruction or gangrene: Secondary | ICD-10-CM | POA: Diagnosis present

## 2014-11-08 DIAGNOSIS — G4733 Obstructive sleep apnea (adult) (pediatric): Secondary | ICD-10-CM | POA: Insufficient documentation

## 2014-11-08 DIAGNOSIS — B4481 Allergic bronchopulmonary aspergillosis: Secondary | ICD-10-CM | POA: Insufficient documentation

## 2014-11-08 DIAGNOSIS — J302 Other seasonal allergic rhinitis: Secondary | ICD-10-CM

## 2014-11-08 DIAGNOSIS — F418 Other specified anxiety disorders: Secondary | ICD-10-CM | POA: Diagnosis not present

## 2014-11-08 DIAGNOSIS — J4550 Severe persistent asthma, uncomplicated: Secondary | ICD-10-CM | POA: Diagnosis not present

## 2014-11-08 DIAGNOSIS — K219 Gastro-esophageal reflux disease without esophagitis: Secondary | ICD-10-CM | POA: Diagnosis not present

## 2014-11-08 DIAGNOSIS — R0602 Shortness of breath: Secondary | ICD-10-CM | POA: Diagnosis not present

## 2014-11-08 DIAGNOSIS — Z803 Family history of malignant neoplasm of breast: Secondary | ICD-10-CM | POA: Insufficient documentation

## 2014-11-08 DIAGNOSIS — E669 Obesity, unspecified: Secondary | ICD-10-CM | POA: Diagnosis not present

## 2014-11-08 DIAGNOSIS — J189 Pneumonia, unspecified organism: Principal | ICD-10-CM | POA: Diagnosis present

## 2014-11-08 DIAGNOSIS — J449 Chronic obstructive pulmonary disease, unspecified: Secondary | ICD-10-CM | POA: Insufficient documentation

## 2014-11-08 DIAGNOSIS — F419 Anxiety disorder, unspecified: Secondary | ICD-10-CM | POA: Diagnosis not present

## 2014-11-08 DIAGNOSIS — F329 Major depressive disorder, single episode, unspecified: Secondary | ICD-10-CM | POA: Insufficient documentation

## 2014-11-08 DIAGNOSIS — Z8042 Family history of malignant neoplasm of prostate: Secondary | ICD-10-CM | POA: Insufficient documentation

## 2014-11-08 DIAGNOSIS — Z7982 Long term (current) use of aspirin: Secondary | ICD-10-CM | POA: Diagnosis not present

## 2014-11-08 DIAGNOSIS — R509 Fever, unspecified: Secondary | ICD-10-CM | POA: Diagnosis not present

## 2014-11-08 DIAGNOSIS — R079 Chest pain, unspecified: Secondary | ICD-10-CM | POA: Diagnosis not present

## 2014-11-08 DIAGNOSIS — J45909 Unspecified asthma, uncomplicated: Secondary | ICD-10-CM | POA: Diagnosis not present

## 2014-11-08 DIAGNOSIS — Z6830 Body mass index (BMI) 30.0-30.9, adult: Secondary | ICD-10-CM | POA: Diagnosis not present

## 2014-11-08 DIAGNOSIS — Z9109 Other allergy status, other than to drugs and biological substances: Secondary | ICD-10-CM | POA: Diagnosis present

## 2014-11-08 DIAGNOSIS — A419 Sepsis, unspecified organism: Secondary | ICD-10-CM | POA: Diagnosis not present

## 2014-11-08 LAB — BASIC METABOLIC PANEL
Anion gap: 8 (ref 5–15)
BUN: 8 mg/dL (ref 6–20)
CHLORIDE: 103 mmol/L (ref 101–111)
CO2: 24 mmol/L (ref 22–32)
CREATININE: 0.56 mg/dL (ref 0.44–1.00)
Calcium: 8.8 mg/dL — ABNORMAL LOW (ref 8.9–10.3)
GFR calc Af Amer: >60 mL/min (ref >60)
GLUCOSE: 120 mg/dL — AB (ref 65–99)
Potassium: 3.8 mmol/L (ref 3.5–5.1)
SODIUM: 135 mmol/L (ref 135–145)

## 2014-11-08 LAB — CBC WITH DIFFERENTIAL/PLATELET
BASOS ABS: 0 10*3/uL (ref 0.0–0.1)
Basophils Relative: 0 % (ref 0–1)
EOS PCT: 2 % (ref 0–5)
Eosinophils Absolute: 0.2 10*3/uL (ref 0.0–0.7)
HCT: 36.4 % (ref 36.0–46.0)
Hemoglobin: 12 g/dL (ref 12.0–15.0)
Lymphocytes Relative: 8 % — ABNORMAL LOW (ref 12–46)
Lymphs Abs: 1 10*3/uL (ref 0.7–4.0)
MCH: 29.2 pg (ref 26.0–34.0)
MCHC: 33 g/dL (ref 30.0–36.0)
MCV: 88.6 fL (ref 78.0–100.0)
MONOS PCT: 9 % (ref 3–12)
Monocytes Absolute: 1.1 10*3/uL — ABNORMAL HIGH (ref 0.1–1.0)
NEUTROS ABS: 10.3 10*3/uL — AB (ref 1.7–7.7)
NEUTROS PCT: 81 % — AB (ref 43–77)
Platelets: 305 10*3/uL (ref 150–400)
RBC: 4.11 MIL/uL (ref 3.87–5.11)
RDW: 14.6 % (ref 11.5–15.5)
WBC: 12.7 10*3/uL — ABNORMAL HIGH (ref 4.0–10.5)

## 2014-11-08 LAB — URINE MICROSCOPIC-ADD ON

## 2014-11-08 LAB — URINALYSIS, ROUTINE W REFLEX MICROSCOPIC
Glucose, UA: NEGATIVE mg/dL
Hgb urine dipstick: NEGATIVE
KETONES UR: 15 mg/dL — AB
NITRITE: NEGATIVE
Protein, ur: 30 mg/dL — AB
SPECIFIC GRAVITY, URINE: 1.022 (ref 1.005–1.030)
UROBILINOGEN UA: 1 mg/dL (ref 0.0–1.0)
pH: 7.5 (ref 5.0–8.0)

## 2014-11-08 LAB — CBC
HEMATOCRIT: 34.8 % — AB (ref 36.0–46.0)
Hemoglobin: 11.2 g/dL — ABNORMAL LOW (ref 12.0–15.0)
MCH: 28.3 pg (ref 26.0–34.0)
MCHC: 32.2 g/dL (ref 30.0–36.0)
MCV: 87.9 fL (ref 78.0–100.0)
Platelets: 307 10*3/uL (ref 150–400)
RBC: 3.96 MIL/uL (ref 3.87–5.11)
RDW: 14.8 % (ref 11.5–15.5)
WBC: 9.5 10*3/uL (ref 4.0–10.5)

## 2014-11-08 LAB — CREATININE, SERUM: CREATININE: 0.63 mg/dL (ref 0.44–1.00)

## 2014-11-08 LAB — I-STAT CG4 LACTIC ACID, ED: LACTIC ACID, VENOUS: 1.16 mmol/L (ref 0.5–2.0)

## 2014-11-08 MED ORDER — AZITHROMYCIN 500 MG PO TABS
500.0000 mg | ORAL_TABLET | ORAL | Status: DC
  Administered 2014-11-09: 500 mg via ORAL
  Filled 2014-11-08: qty 1

## 2014-11-08 MED ORDER — IPRATROPIUM-ALBUTEROL 0.5-2.5 (3) MG/3ML IN SOLN
3.0000 mL | RESPIRATORY_TRACT | Status: DC
  Administered 2014-11-08: 3 mL via RESPIRATORY_TRACT
  Filled 2014-11-08: qty 3

## 2014-11-08 MED ORDER — DIPHENHYDRAMINE HCL 25 MG PO CAPS: 25.0000 mg | ORAL_CAPSULE | Freq: Four times a day (QID) | ORAL | Status: DC | PRN

## 2014-11-08 MED ORDER — DEXTROSE 5 % IV SOLN
500.0000 mg | Freq: Once | INTRAVENOUS | Status: AC
  Administered 2014-11-08: 500 mg via INTRAVENOUS

## 2014-11-08 MED ORDER — IPRATROPIUM-ALBUTEROL 0.5-2.5 (3) MG/3ML IN SOLN
3.0000 mL | Freq: Four times a day (QID) | RESPIRATORY_TRACT | Status: DC
  Administered 2014-11-08 – 2014-11-09 (×2): 3 mL via RESPIRATORY_TRACT
  Filled 2014-11-08 (×2): qty 3

## 2014-11-08 MED ORDER — ALPRAZOLAM 0.5 MG PO TABS: 0.5000 mg | ORAL_TABLET | Freq: Two times a day (BID) | ORAL | Status: DC | PRN

## 2014-11-08 MED ORDER — SODIUM CHLORIDE 0.9 % IV BOLUS (SEPSIS)
1000.0000 mL | Freq: Once | INTRAVENOUS | Status: AC
  Administered 2014-11-08: 1000 mL via INTRAVENOUS

## 2014-11-08 MED ORDER — MONTELUKAST SODIUM 10 MG PO TABS
10.0000 mg | ORAL_TABLET | Freq: Two times a day (BID) | ORAL | Status: DC
  Administered 2014-11-08 – 2014-11-09 (×2): 10 mg via ORAL
  Filled 2014-11-08 (×3): qty 1

## 2014-11-08 MED ORDER — AZITHROMYCIN 500 MG IV SOLR
INTRAVENOUS | Status: AC
  Filled 2014-11-08: qty 500

## 2014-11-08 MED ORDER — GUAIFENESIN-DM 100-10 MG/5ML PO SYRP
5.0000 mL | ORAL_SOLUTION | ORAL | Status: DC | PRN
  Administered 2014-11-08: 5 mL via ORAL
  Filled 2014-11-08 (×2): qty 5

## 2014-11-08 MED ORDER — TIOTROPIUM BROMIDE MONOHYDRATE 18 MCG IN CAPS: 18.0000 ug | ORAL_CAPSULE | Freq: Every day | RESPIRATORY_TRACT | Status: DC

## 2014-11-08 MED ORDER — ENOXAPARIN SODIUM 40 MG/0.4ML ~~LOC~~ SOLN
40.0000 mg | SUBCUTANEOUS | Status: DC
  Administered 2014-11-08: 40 mg via SUBCUTANEOUS
  Filled 2014-11-08 (×2): qty 0.4

## 2014-11-08 MED ORDER — IPRATROPIUM-ALBUTEROL 0.5-2.5 (3) MG/3ML IN SOLN: 3.0000 mL | RESPIRATORY_TRACT | Status: DC | PRN

## 2014-11-08 MED ORDER — SODIUM CHLORIDE 0.9 % IV BOLUS (SEPSIS): 2000.0000 mL | Freq: Once | INTRAVENOUS | Status: DC

## 2014-11-08 MED ORDER — ACETAMINOPHEN 325 MG PO TABS: 650.0000 mg | ORAL_TABLET | Freq: Four times a day (QID) | ORAL | Status: DC | PRN

## 2014-11-08 MED ORDER — ACETAMINOPHEN 325 MG PO TABS
650.0000 mg | ORAL_TABLET | Freq: Once | ORAL | Status: AC
  Administered 2014-11-08: 650 mg via ORAL
  Filled 2014-11-08: qty 2

## 2014-11-08 MED ORDER — FAMOTIDINE 40 MG PO TABS
40.0000 mg | ORAL_TABLET | Freq: Every day | ORAL | Status: DC
  Filled 2014-11-08 (×3): qty 1

## 2014-11-08 MED ORDER — DEXTROSE 5 % IV SOLN
1.0000 g | INTRAVENOUS | Status: DC
  Administered 2014-11-08 – 2014-11-09 (×2): 1 g via INTRAVENOUS
  Filled 2014-11-08: qty 10

## 2014-11-08 MED ORDER — HYDROCODONE-HOMATROPINE 5-1.5 MG/5ML PO SYRP
5.0000 mL | ORAL_SOLUTION | Freq: Four times a day (QID) | ORAL | Status: DC | PRN
  Administered 2014-11-08: 5 mL via ORAL
  Filled 2014-11-08: qty 5

## 2014-11-08 MED ORDER — FLUTICASONE PROPIONATE HFA 44 MCG/ACT IN AERO
2.0000 | INHALATION_SPRAY | Freq: Two times a day (BID) | RESPIRATORY_TRACT | Status: DC
  Administered 2014-11-08 – 2014-11-09 (×2): 2 via RESPIRATORY_TRACT
  Filled 2014-11-08: qty 10.6

## 2014-11-08 MED ORDER — OXYCODONE HCL 5 MG PO TABS: 5.0000 mg | ORAL_TABLET | Freq: Four times a day (QID) | ORAL | Status: DC | PRN

## 2014-11-08 MED ORDER — VENLAFAXINE HCL ER 150 MG PO CP24
150.0000 mg | ORAL_CAPSULE | Freq: Every day | ORAL | Status: DC
  Administered 2014-11-09: 150 mg via ORAL
  Filled 2014-11-08 (×2): qty 1

## 2014-11-08 MED ORDER — PANTOPRAZOLE SODIUM 40 MG PO TBEC
40.0000 mg | DELAYED_RELEASE_TABLET | Freq: Every day | ORAL | Status: DC
  Administered 2014-11-08 – 2014-11-09 (×2): 40 mg via ORAL
  Filled 2014-11-08: qty 1

## 2014-11-08 MED ORDER — LORATADINE 10 MG PO TABS
10.0000 mg | ORAL_TABLET | Freq: Every day | ORAL | Status: DC
  Administered 2014-11-08 – 2014-11-09 (×2): 10 mg via ORAL
  Filled 2014-11-08 (×2): qty 1

## 2014-11-08 MED ORDER — METHYLPREDNISOLONE SODIUM SUCC 125 MG IJ SOLR
125.0000 mg | Freq: Once | INTRAMUSCULAR | Status: AC
  Administered 2014-11-08: 125 mg via INTRAVENOUS
  Filled 2014-11-08: qty 2

## 2014-11-08 MED ORDER — ASPIRIN 81 MG PO CHEW
81.0000 mg | CHEWABLE_TABLET | Freq: Every day | ORAL | Status: DC
  Administered 2014-11-08 – 2014-11-09 (×2): 81 mg via ORAL
  Filled 2014-11-08 (×2): qty 1

## 2014-11-08 NOTE — ED Notes (Signed)
Patent walked lap around the unit and then back to the restroom. SAT 98-100% entire time. RT to monitor as needed

## 2014-11-08 NOTE — H&P (Addendum)
[facility] History and Physical  Victoria [name] HER:[id number] DOB: 07-03-1955 DOA: 11/08/2014  Referring physician: ED physician PCP: [name], MD   Chief Complaint: Fever  HPI:  Victoria [name] is a 59yo woman with PMH of Asthma, ABPA, allergies, GERD, Depression/anxiety, HLD who presents with fever X 3 days.  She reports that she was on vacation when she started having chills and fever.  She checked her temperature and the highest was 101.41F, which resolved with tylenol.  Yesterday, she developed pain with deep breathing particularly under the right breast and on the right anterior chest today.  She had similar symptoms when she last had pneumonia 3 years ago, so she was concerned.  She has chronic pulmonary issues including asthma and allergic bronchopulmonary aspergillosis for which she follows with Pulmonary (Dr. Chase Caller).  She reports being SOB and fatigued outside of her normal since March.  She has not had a flare of her asthma or been on steroids since December of last year.  She denies wheezing, but she did check her pulse Ox, which was normal, but her pulse was 114 when she was having a fever.  She has some chronic swelling in her legs, and will get a rash once she defervesces from a fever.  She got both the flu shot and prevnar 13 this year.    In the ED, she had a CXR which showed bilateral lower lobe pneumonia.  She was initiated on therapy for CAP including rocephin and azithromycin.  She also received solumedrol.   Assessment and Plan: Bilateral pneumonia - Seen on CXR - Also with elevated WBC (up to 12.7 from 7.2 in March of this year), fever and tachycardia - She has not been hospitalized recently, no recent steroids - Treat as CAP, Rocephin and azithromycin started - Pleuritic chest pain to be treated with tylenol, oxycodone as needed - BC X 2 sent - Sputum culture if she develops any sputum - Cough medication with robitussin - Flu panel, droplet precautions until  then (viral pneumonia is a possibility) - Tylenol for fever - She does have sinus tachycardia, but no acute risk factors for PE - wells score is 1.5 due to heart rate, low risk.  Geneva score of 6 for heart rate and age.   - Currently saturating well on room air.  Consider CT scan in the AM if she does not improve on therapy for pneumonia.   Asthma, severe persistent and ABPA (allergice bronchopulmonary aspergillosis) - She has no wheezing on exam, reports none at home and no worsening of her chronic breathing issues (SOB X 3 months per her, not worse today) - ABPA presents as recurrent episodes of bronchial obstruction, mucus plugging, plugs which are expectorated, sometimes hemoptysis, fever, malaise - Though she has the latter 2 above, she has no peripheral eosinophilia today, no obstruction apparent obstruction or mucus plugging - She received a dose of solumedrol in the ED already, consider starting steroids in the AM if she begins wheezing - Duonebs 4 times daily and PRN - Continue home asmanex    Depression/Anxiety - She is on effexor and xanax 0.5mg  BID prn - these will be continued  Esophageal reflux - She is on nexium and zantac - continued (changed to protonix per formulary and zantac)    Environmental allergies - Continue allegra, singulair    Chest pain, atypical - Likely pleuritic given her report, no substernal chest pain, worse with deep breathing and reproducible.  Very atypical for ACS. Monitor closely -  Pain control with tylenol and oxycodone as needed    DVT PPx: Lovenox  Diet: Regular      Radiological Exams on Admission: Dg Chest 2 View  11/08/2014   CLINICAL DATA:  Three day history of shortness of breath and right-sided chest pain. Current history of asthma.  EXAM: CHEST  2 VIEW  COMPARISON:  05/30/2014 and earlier.  FINDINGS: Cardiomediastinal silhouette unremarkable, unchanged. Dense airspace consolidation in the right lower lobe with silhouetting of the  right hemidiaphragm. Patchy airspace opacities in the left lower lobe. Stable linear scarring in the right upper lobe. No pleural effusions. Degenerative changes involving the thoracic spine with slight thoracic scoliosis convex right.  IMPRESSION: Bilateral lower lobe pneumonia, right greater than left.   Electronically Signed   By: Evangeline Dakin M.D.   On: 11/08/2014 13:34     Code Status: Full Family Communication: Pt at bedside Disposition Plan: Admit for further evaluation    Gilles Chiquito, MD [phone]   Review of Systems:  Constitutional: + for fever, chills, reported rigors, fatigue, diaphoresis with fever resolving.  HENT: Negative for hearing loss, ear pain, nosebleeds Eyes: Negative for blurred vision, double vision Respiratory: + for SOB Negative for cough, hemoptysis, sputum production, wheezing and stridor.   Cardiovascular: + for pleuritic chest pain, occasional palpitations, occasional leg swelling at the end of the day Negative for  orthopnea, claudication   Gastrointestinal: Negative for nausea, vomiting and abdominal pain Genitourinary: Negative for dysuria, urgency Musculoskeletal: Negative for myalgias, back pain, joint pain and falls.  Skin: + for rash after fever, "hives" Negative for itching.  Neurological: Negative for dizziness and weakness Endo/Heme/Allergies: + for environmental allergies  Psychiatric/Behavioral: The patient is not nervous/anxious currently.      Past Medical History  Diagnosis Date  . Asthma   . Obesity   . Maxillary sinusitis   . Osteoarthritis   . IBS (irritable bowel syndrome)   . Hyperlipidemia   . COPD (chronic obstructive pulmonary disease)   . Pulmonary nodules   . Osteoporosis           . Cough       . GERD (gastroesophageal reflux disease)   . H/O hiatal hernia   . Depression     mild  . Allergic bronchopulmonary aspergillosis 2008    sees Dr Edmund Hilda pulmonology          . Normal cardiac stress test  11/2011  . OSA (obstructive sleep apnea) 02/2012    has stopped using  cpap      . Chronic bronchitis   . Exertional dyspnea     "sometimes lying down; always w/exertion" (04/13/2012)  . Headache(784.0)     "often but not daily" (04/13/2012)  . Anxiety   . Pneumonia 11/2011    "before 2013 I hadn't had pneumonia since I was a child" (04/13/2012)  . Schatzki's ring   . Pain in joint, ankle and foot 08/10/2014  . Palpitations 08/19/2008    Qualifier: Diagnosis of  By: Jerold Coombe      Past Surgical History  Procedure Laterality Date  . Cesarean section  1985  . Hernia repair  04/13/2012    VHR laparoscopic  . Appendectomy  1989  . Ventral hernia repair  04/13/2012    Procedure: LAPAROSCOPIC VENTRAL HERNIA;  Surgeon: Adin Hector, MD;  Location: [facility];  Service: General;  Laterality: N/A;  laparoscopic repair of incarcerated hernia    Social History:  reports that she has never  smoked. She has never used smokeless tobacco. She reports that she drinks about 2.4 oz of alcohol per week. She reports that she does not use illicit drugs.  Allergies  Allergen Reactions  . Beclomethasone Dipropionate Hives and Other (See Comments)     weight gain  . Budesonide-Formoterol Fumarate Hives  . Sulfonamide Derivatives Hives and Rash  . Mometasone Furo-Formoterol Fum Hives and Other (See Comments)    weight gain  . Statins     Myalgias, RLS    Family History  Problem Relation Age of Onset  . Breast cancer Mother   . Prostate cancer Father   . Diverticulosis Father   . Prostate cancer Father     prostate  . Pulmonary embolism Brother     recurrent  . Breast cancer Sister     breast    Prior to Admission medications   Medication Sig Start Date End Date Taking? Authorizing Provider       Historical Provider, MD  albuterol (PROAIR HFA) 108 (90 BASE) MCG/ACT inhaler Inhale 2 puffs into the lungs every 6 (six) hours as needed. For shortness of breath    Historical Provider, MD   ALPRAZolam (XANAX) 0.5 MG tablet Take 1 tablet (0.5 mg total) by mouth 2 (two) times daily as needed for anxiety (Palpatations, SOB). 07/29/14   Mosie Lukes, MD  aspirin 81 MG tablet Take 81 mg by mouth daily.     Historical Provider, MD  Cholecalciferol (VITAMIN D) 2000 UNITS CAPS Take 2,000 Units by mouth.      Historical Provider, MD  esomeprazole (NEXIUM) 20 MG capsule Take 20 mg by mouth daily at 12 noon.    Historical Provider, MD  fexofenadine (ALLEGRA) 180 MG tablet Take 180 mg by mouth as needed.     Historical Provider, MD         ibuprofen (ADVIL,MOTRIN) 200 MG tablet Take 400 mg by mouth every 6 (six) hours as needed. For pain    Historical Provider, MD  ipratropium-albuterol (DUONEB) 0.5-2.5 (3) MG/3ML SOLN Inhale 3 mLs into the lungs 4 (four) times daily as needed. 05/19/14   Brand Males, MD  mometasone (ASMANEX 60 METERED DOSES) 220 MCG/INH inhaler USE 2 INHALATIONS ORALLY EVERY EVENING 11/06/13   Brand Males, MD  montelukast (SINGULAIR) 10 MG tablet TAKE 1 TABLET BY MOUTH TWICE DAILY 11/06/13   Brand Males, MD  ranitidine (ZANTAC) 300 MG tablet Take 1 tablet (300 mg total) by mouth at bedtime. 04/23/14   Mosie Lukes, MD  tiotropium (SPIRIVA HANDIHALER) 18 MCG inhalation capsule INHALE CONTENTS OF 1 CAPSULE THROUGH HANDIHALER DEVICE EVERY DAY 11/06/13   Brand Males, MD  triamcinolone (NASACORT AQ) 55 MCG/ACT AERO nasal inhaler Place 2 sprays into the nose as needed.     Historical Provider, MD  venlafaxine XR (EFFEXOR-XR) 150 MG 24 hr capsule Take 150 mg by mouth daily with breakfast.    Historical Provider, MD    Physical Exam: Filed Vitals:   11/08/14 1612 11/08/14 1653 11/08/14 1719 11/08/14 1834  BP: 124/68 113/67  115/61  Pulse: 108 99  100  Temp:   98.5 F (36.9 C) 98.6 F (37 C)  TempSrc:   Oral Oral  Resp: 18   18  Height:      Weight:      SpO2: 97% 99%  99%    Physical Exam  Constitutional: Appears well-developed and well-nourished. No  distress. Sitting on side of bed HENT: Normocephalic. Eyes: Conjunctivae are normal. no scleral  icterus.  Neck: Normal ROM. Neck supple.  No tracheal deviation. No thyromegaly.  CVS: RR, NR, S1/S2 +, no murmurs noted Pulmonary: Effort and breath sounds normal, no stridor, mild rhonchi in left lower base, cleared with deep breathing, no wheezing  Abdominal: Soft. BS +,  no tenderness  Musculoskeletal: No edema and no tenderness.  Thin limbs Lymphadenopathy: No lymphadenopathy noted, cervical. Neuro: Alert.  No cranial nerve deficit. Skin: Skin is warm and dry. No rash noted. Not diaphoretic. No erythema. No pallor.  Psychiatric: Normal mood and affect. Behavior, judgment, thought content normal.   Labs on Admission:  Basic Metabolic Panel:  Recent Labs Lab 11/08/14 1430  NA 135  K 3.8  CL 103  CO2 24  GLUCOSE 120*  BUN 8  CREATININE 0.56  CALCIUM 8.8*   CBC:  Recent Labs Lab 11/08/14 1430  WBC 12.7*  NEUTROABS 10.3*  HGB 12.0  HCT 36.4  MCV 88.6  PLT 305    EKG: Sinus tach, left atrial enlargement   If 7PM-7AM, please contact night-coverage [email or web address] Password [email or web address] 11/08/2014, 7:00 PM

## 2014-11-08 NOTE — Progress Notes (Signed)
Patient has arrived to [facility] MD page and notified patient is on the unit.   Patient alert and oriented. Patient oriented to unit and unit protocol.

## 2014-11-08 NOTE — Progress Notes (Signed)
Pt is on rocephin/zithromax for PNA. These meds will not require renal adjustments. Rx will sign off.

## 2014-11-08 NOTE — ED Provider Notes (Signed)
CSN: [id number]     Arrival date & time 11/08/14  1248 History   First MD Initiated Contact with Patient 11/08/14 1343     Chief Complaint  Patient presents with  . Fever     (Consider location/radiation/quality/duration/timing/severity/associated sxs/prior Treatment) HPI   Victoria [name] is a 59 y.o. female with past medical history significant for asthma, COPD, complaining of rigors onset 4 days ago with MAXIMUM TEMPERATURE of 101.7 associated with fever, shortness of breath. Patient denies cough she endorses a right lower anterior chest pain that is pleuritic. She denies abdominal pain, nausea, vomiting, change in bowel or bladder habits, dysuria, hematuria. She has been taking acetaminophen at home but the fever returns in several hours. Denies home oxygen use. She states that she has a generalized weakness and feels dyspnea on exertion that is interfering with her ADLs. Pt denies h/o DVT/PE, calf pain or leg swelling, hemoptysis, recent immobilization, cancer/chemotherapy in the last 6 months, exogenous estrogen. She denies history of CHF, chart review shows that she is taking Lasix but she says that she was prescribed this by her PCP for mild bilateral lower extremity edema and she hasn't had it in the last month.  Patient follows with [facility] pulmonology Dr. Chase Caller.  Past Medical History  Diagnosis Date  . Asthma   . Obesity   . Maxillary sinusitis   . Osteoarthritis   . IBS (irritable bowel syndrome)   . Hyperlipidemia   . COPD (chronic obstructive pulmonary disease)   . Pulmonary nodules   . Osteoporosis   . Nasal congestion 9/12    current cold  . Cough   . Wheezing   . GERD (gastroesophageal reflux disease)   . H/O hiatal hernia   . Depression     mild  . Allergic bronchopulmonary aspergillosis 2008    sees Dr Edmund Hilda pulmonology  . Anemia     iron deficiency, resolved  . Normal cardiac stress test 11/2011  . OSA (obstructive sleep apnea) 02/2012    has  stopped using  cpap  . Anginal pain   . Chronic bronchitis   . Exertional dyspnea     "sometimes lying down; always w/exertion" (04/13/2012)  . Headache(784.0)     "often but not daily" (04/13/2012)  . Migraines   . Anxiety   . Pneumonia 11/2011    "before 2013 I hadn't had pneumonia since I was a child" (04/13/2012)  . Anxiety state, unspecified 08/18/2013  . Schatzki's ring   . Pain in joint, ankle and foot 08/10/2014  . Palpitations 08/19/2008    Qualifier: Diagnosis of  By: Jerold Coombe     Past Surgical History  Procedure Laterality Date  . Cesarean section  1985  . Hernia repair  04/13/2012    VHR laparoscopic  . Appendectomy  1989  . Ventral hernia repair  04/13/2012    Procedure: LAPAROSCOPIC VENTRAL HERNIA;  Surgeon: Adin Hector, MD;  Location: [place];  Service: General;  Laterality: N/A;  laparoscopic repair of incarcerated hernia   Family History  Problem Relation Age of Onset  . Breast cancer Mother   . Prostate cancer Father   . Diverticulosis Father   . Prostate cancer Father     prostate  . Pulmonary embolism Brother     recurrent  . Breast cancer Sister     breast   History  Substance Use Topics  . Smoking status: Never Smoker   . Smokeless tobacco: Never Used  . Alcohol Use: 2.4  oz/week    4 Glasses of wine per week     Comment: 04/13/2012 "beer in the summer; wine 3-4 times/wk"   OB History    No data available     Review of Systems  10 systems reviewed and found to be negative, except as noted in the HPI.   Allergies  Beclomethasone dipropionate; Budesonide-formoterol fumarate; Sulfonamide derivatives; Mometasone furo-formoterol fum; and Statins  Home Medications   Prior to Admission medications   Medication Sig Start Date End Date Taking? Authorizing Provider  Acetylcysteine (NAC) 600 MG CAPS Take 1 capsule by mouth 2 (two) times daily.    Historical Provider, MD  albuterol (PROAIR HFA) 108 (90 BASE) MCG/ACT inhaler Inhale 2 puffs  into the lungs every 6 (six) hours as needed. For shortness of breath    Historical Provider, MD  ALPRAZolam (XANAX) 0.5 MG tablet Take 1 tablet (0.5 mg total) by mouth 2 (two) times daily as needed for anxiety (Palpatations, SOB). 07/29/14   Mosie Lukes, MD  aspirin 81 MG tablet Take 81 mg by mouth daily.     Historical Provider, MD  Cholecalciferol (VITAMIN D) 2000 UNITS CAPS Take 2,000 Units by mouth.      Historical Provider, MD  esomeprazole (NEXIUM) 20 MG capsule Take 20 mg by mouth daily at 12 noon.    Historical Provider, MD  fexofenadine (ALLEGRA) 180 MG tablet Take 180 mg by mouth as needed.     Historical Provider, MD  furosemide (LASIX) 20 MG tablet Take 1 tablet (20 mg total) by mouth daily as needed. Patient not taking: Reported on 09/03/2014 07/29/14   Mosie Lukes, MD  ibuprofen (ADVIL,MOTRIN) 200 MG tablet Take 400 mg by mouth every 6 (six) hours as needed. For pain    Historical Provider, MD  ipratropium-albuterol (DUONEB) 0.5-2.5 (3) MG/3ML SOLN Inhale 3 mLs into the lungs 4 (four) times daily as needed. 05/19/14   Brand Males, MD  mometasone (ASMANEX 60 METERED DOSES) 220 MCG/INH inhaler USE 2 INHALATIONS ORALLY EVERY EVENING 11/06/13   Brand Males, MD  montelukast (SINGULAIR) 10 MG tablet TAKE 1 TABLET BY MOUTH TWICE DAILY 11/06/13   Brand Males, MD  ranitidine (ZANTAC) 300 MG tablet Take 1 tablet (300 mg total) by mouth at bedtime. 04/23/14   Mosie Lukes, MD  tiotropium (SPIRIVA HANDIHALER) 18 MCG inhalation capsule INHALE CONTENTS OF 1 CAPSULE THROUGH HANDIHALER DEVICE EVERY DAY 11/06/13   Brand Males, MD  triamcinolone (NASACORT AQ) 55 MCG/ACT AERO nasal inhaler Place 2 sprays into the nose as needed.     Historical Provider, MD  venlafaxine XR (EFFEXOR-XR) 150 MG 24 hr capsule Take 150 mg by mouth daily with breakfast.    Historical Provider, MD   BP 124/68 mmHg  Pulse 108  Temp(Src) 99.6 F (37.6 C) (Oral)  Resp 18  Ht 5\' 2"  (1.575 m)  Wt 169 lb  (76.658 kg)  BMI 30.90 kg/m2  SpO2 97% Physical Exam  Constitutional: She is oriented to person, place, and time. She appears well-developed and well-nourished. No distress.  HENT:  Head: Normocephalic and atraumatic.  Mouth/Throat: Oropharynx is clear and moist.  Eyes: Conjunctivae and EOM are normal. Pupils are equal, round, and reactive to light.  Neck: Normal range of motion. No JVD present. No tracheal deviation present.  Cardiovascular: Normal rate, regular rhythm and intact distal pulses.   Radial pulse equal bilaterally  Pulmonary/Chest: Effort normal and breath sounds normal. No stridor. No respiratory distress. She has no wheezes. She  has no rales. She exhibits tenderness.    Abdominal: Soft. Bowel sounds are normal. She exhibits no distension and no mass. There is no tenderness. There is no rebound and no guarding.  Musculoskeletal: Normal range of motion. She exhibits no edema or tenderness.  No calf asymmetry, superficial collaterals, palpable cords, edema, Homans sign negative bilaterally.    Neurological: She is alert and oriented to person, place, and time.  Skin: Skin is warm. She is not diaphoretic.  Psychiatric: She has a normal mood and affect.  Nursing note and vitals reviewed.   ED Course  Procedures (including critical care time) Labs Review Labs Reviewed  URINALYSIS, ROUTINE W REFLEX MICROSCOPIC - Abnormal; Notable for the following:    Color, Urine AMBER (*)    Bilirubin Urine SMALL (*)    Ketones, ur 15 (*)    Protein, ur 30 (*)    Leukocytes, UA TRACE (*)    All other components within normal limits  CBC WITH DIFFERENTIAL/PLATELET - Abnormal; Notable for the following:    WBC 12.7 (*)    Neutrophils Relative % 81 (*)    Neutro Abs 10.3 (*)    Lymphocytes Relative 8 (*)    Monocytes Absolute 1.1 (*)    All other components within normal limits  BASIC METABOLIC PANEL - Abnormal; Notable for the following:    Glucose, Bld 120 (*)    Calcium 8.8 (*)     All other components within normal limits  URINE MICROSCOPIC-ADD ON - Abnormal; Notable for the following:    Bacteria, UA MANY (*)    All other components within normal limits  CULTURE, BLOOD (ROUTINE X 2)  CULTURE, BLOOD (ROUTINE X 2)  URINE CULTURE  I-STAT CG4 LACTIC ACID, ED  I-STAT CG4 LACTIC ACID, ED    Imaging Review Dg Chest 2 View  11/08/2014   CLINICAL DATA:  Three day history of shortness of breath and right-sided chest pain. Current history of asthma.  EXAM: CHEST  2 VIEW  COMPARISON:  05/30/2014 and earlier.  FINDINGS: Cardiomediastinal silhouette unremarkable, unchanged. Dense airspace consolidation in the right lower lobe with silhouetting of the right hemidiaphragm. Patchy airspace opacities in the left lower lobe. Stable linear scarring in the right upper lobe. No pleural effusions. Degenerative changes involving the thoracic spine with slight thoracic scoliosis convex right.  IMPRESSION: Bilateral lower lobe pneumonia, right greater than left.   Electronically Signed   By: Evangeline Dakin M.D.   On: 11/08/2014 13:34     EKG Interpretation   Date/Time:  Saturday Nov 08 2014 13:00:25 EDT Ventricular Rate:  108 PR Interval:  148 QRS Duration: 80 QT Interval:  324 QTC Calculation: 434 R Axis:   43 Text Interpretation:  Sinus tachycardia Possible Left atrial enlargement  Borderline ECG since last tracing no significant change Confirmed by BELFI   MD, MELANIE ([id number]) on 11/08/2014 1:04:00 PM      MDM   Final diagnoses:  Bilateral pneumonia  Chronic obstructive pulmonary disease, unspecified COPD, unspecified chronic bronchitis type   Filed Vitals:   11/08/14 1254 11/08/14 1510 11/08/14 1553 11/08/14 1612  BP: 119/67 121/64  124/68  Pulse:  102  108  Temp:   99.6 F (37.6 C)   TempSrc:   Oral   Resp:  18  18  Height:      Weight:      SpO2:  98%  97%    Medications  cefTRIAXone (ROCEPHIN) 1 g in dextrose 5 % 50 mL IVPB (  0 g Intravenous Stopped  11/08/14 1606)  ipratropium-albuterol (DUONEB) 0.5-2.5 (3) MG/3ML nebulizer solution 3 mL (3 mLs Nebulization Given 11/08/14 1448)  azithromycin (ZITHROMAX) 500 mg in dextrose 5 % 250 mL IVPB (500 mg Intravenous New Bag/Given 11/08/14 1607)  sodium chloride 0.9 % bolus 1,000 mL (0 mLs Intravenous Stopped 11/08/14 1514)  methylPREDNISolone sodium succinate (SOLU-MEDROL) 125 mg/2 mL injection 125 mg (125 mg Intravenous Given 11/08/14 1445)  azithromycin (ZITHROMAX) 500 MG injection (  Duplicate 3/33/54 5625)  acetaminophen (TYLENOL) tablet 650 mg (650 mg Oral Given 11/08/14 1606)    Aneya Daddona is a pleasant 59 y.o. female with COPD presenting with right gutters, fever, shortness of breath, dyspnea on exertion and generalized weakness. Lung sounds are excellent with cough good movement in all fields, no wheezing. Patient is mildly tachycardic at 105, her oxygen saturation is 100% on room air. Chest x-ray shows a bilateral lower lobe infiltrate. Blood cultures are drawn, confirmed with patient that she has not been hospitalized in the last 3 months and a community-acquired regimen of Rocephin and azithromycin. Patient will be started on Solu-Medrol and given a DuoNeb. Patient has a normal lactic acid of 1.16, mild leukocytosis of 12.7. EKG is nonischemic.  Considering the patient's underlying COPD, bilateral infiltrates I think she will require admission.  Patient has a normal lactic acid, mild leukocytosis of 12.7. Her UA shows many bacteria with negative nitrites but positive leukocytes. Culture is pending, she has no symptoms however she is covered UTI with antibiotic regimen for for community-acquired pneumonia which includes Rocephin.  Patient remained good O2 sat while she was ambulated. Case discussed with triad hospitalist Bonnielee Haff who accepts admission to a telemetry bed at [facility]. EMTALA  complete  Monico Blitz, PA-C 11/08/14 [place], MD 11/09/14 [phone]

## 2014-11-08 NOTE — ED Notes (Addendum)
Pt reports cough, fever, sob, speaking in short sentences during triage.  Chills.  Denies n/v/d.  Has had fatigue, "pain in right lung".  Also reports dark colored urine, denies dysuria.

## 2014-11-09 ENCOUNTER — Emergency Department (HOSPITAL_COMMUNITY): Payer: Medicare Other

## 2014-11-09 ENCOUNTER — Encounter (HOSPITAL_COMMUNITY): Payer: Self-pay | Admitting: Emergency Medicine

## 2014-11-09 ENCOUNTER — Inpatient Hospital Stay (HOSPITAL_COMMUNITY)
Admission: EM | Admit: 2014-11-09 | Discharge: 2014-11-11 | DRG: 871 | Disposition: A | Discharge status: Home / Self Care | Payer: Medicare Other | Attending: Internal Medicine | Admitting: Internal Medicine

## 2014-11-09 DIAGNOSIS — J189 Pneumonia, unspecified organism: Secondary | ICD-10-CM | POA: Diagnosis not present

## 2014-11-09 DIAGNOSIS — F411 Generalized anxiety disorder: Secondary | ICD-10-CM | POA: Diagnosis present

## 2014-11-09 DIAGNOSIS — J449 Chronic obstructive pulmonary disease, unspecified: Secondary | ICD-10-CM | POA: Diagnosis present

## 2014-11-09 DIAGNOSIS — M1990 Unspecified osteoarthritis, unspecified site: Secondary | ICD-10-CM | POA: Diagnosis present

## 2014-11-09 DIAGNOSIS — Z7982 Long term (current) use of aspirin: Secondary | ICD-10-CM | POA: Diagnosis not present

## 2014-11-09 DIAGNOSIS — K219 Gastro-esophageal reflux disease without esophagitis: Secondary | ICD-10-CM | POA: Diagnosis present

## 2014-11-09 DIAGNOSIS — G4733 Obstructive sleep apnea (adult) (pediatric): Secondary | ICD-10-CM

## 2014-11-09 DIAGNOSIS — R911 Solitary pulmonary nodule: Secondary | ICD-10-CM | POA: Diagnosis not present

## 2014-11-09 DIAGNOSIS — F329 Major depressive disorder, single episode, unspecified: Secondary | ICD-10-CM | POA: Diagnosis present

## 2014-11-09 DIAGNOSIS — Z6832 Body mass index (BMI) 32.0-32.9, adult: Secondary | ICD-10-CM | POA: Diagnosis not present

## 2014-11-09 DIAGNOSIS — M810 Age-related osteoporosis without current pathological fracture: Secondary | ICD-10-CM | POA: Diagnosis present

## 2014-11-09 DIAGNOSIS — J4550 Severe persistent asthma, uncomplicated: Secondary | ICD-10-CM | POA: Diagnosis not present

## 2014-11-09 DIAGNOSIS — R0602 Shortness of breath: Secondary | ICD-10-CM

## 2014-11-09 DIAGNOSIS — K210 Gastro-esophageal reflux disease with esophagitis: Secondary | ICD-10-CM

## 2014-11-09 DIAGNOSIS — Z79899 Other long term (current) drug therapy: Secondary | ICD-10-CM

## 2014-11-09 DIAGNOSIS — F419 Anxiety disorder, unspecified: Secondary | ICD-10-CM | POA: Diagnosis present

## 2014-11-09 DIAGNOSIS — A419 Sepsis, unspecified organism: Secondary | ICD-10-CM | POA: Diagnosis present

## 2014-11-09 DIAGNOSIS — R0781 Pleurodynia: Secondary | ICD-10-CM | POA: Diagnosis not present

## 2014-11-09 DIAGNOSIS — K589 Irritable bowel syndrome without diarrhea: Secondary | ICD-10-CM | POA: Diagnosis present

## 2014-11-09 DIAGNOSIS — F418 Other specified anxiety disorders: Secondary | ICD-10-CM | POA: Diagnosis present

## 2014-11-09 DIAGNOSIS — E669 Obesity, unspecified: Secondary | ICD-10-CM | POA: Diagnosis present

## 2014-11-09 DIAGNOSIS — J479 Bronchiectasis, uncomplicated: Secondary | ICD-10-CM | POA: Diagnosis present

## 2014-11-09 DIAGNOSIS — R069 Unspecified abnormalities of breathing: Secondary | ICD-10-CM | POA: Diagnosis not present

## 2014-11-09 DIAGNOSIS — M179 Osteoarthritis of knee, unspecified: Secondary | ICD-10-CM | POA: Diagnosis present

## 2014-11-09 DIAGNOSIS — E785 Hyperlipidemia, unspecified: Secondary | ICD-10-CM | POA: Diagnosis present

## 2014-11-09 DIAGNOSIS — R079 Chest pain, unspecified: Secondary | ICD-10-CM | POA: Diagnosis not present

## 2014-11-09 LAB — URINE CULTURE: Colony Count: >=100000

## 2014-11-09 LAB — CBC
HCT: 33.8 % — ABNORMAL LOW (ref 36.0–46.0)
HEMATOCRIT: 34.5 % — AB (ref 36.0–46.0)
HEMOGLOBIN: 11 g/dL — AB (ref 12.0–15.0)
Hemoglobin: 11.2 g/dL — ABNORMAL LOW (ref 12.0–15.0)
MCH: 28.5 pg (ref 26.0–34.0)
MCH: 28.6 pg (ref 26.0–34.0)
MCHC: 32.5 g/dL (ref 30.0–36.0)
MCHC: 32.5 g/dL (ref 30.0–36.0)
MCV: 87.8 fL (ref 78.0–100.0)
MCV: 88 fL (ref 78.0–100.0)
Platelets: 309 10*3/uL (ref 150–400)
Platelets: 378 10*3/uL (ref 150–400)
RBC: 3.93 MIL/uL (ref 3.87–5.11)
RBC: 3.84 MIL/uL — AB (ref 3.87–5.11)
RDW: 15 % (ref 11.5–15.5)
RDW: 15.1 % (ref 11.5–15.5)
WBC: 8.7 10*3/uL (ref 4.0–10.5)
WBC: 13.7 10*3/uL — ABNORMAL HIGH (ref 4.0–10.5)

## 2014-11-09 LAB — BASIC METABOLIC PANEL
ANION GAP: 8 (ref 5–15)
Anion gap: 10 (ref 5–15)
BUN: 7 mg/dL (ref 6–20)
BUN: 14 mg/dL (ref 6–20)
CALCIUM: 8.4 mg/dL — AB (ref 8.9–10.3)
CHLORIDE: 105 mmol/L (ref 101–111)
CO2: 22 mmol/L (ref 22–32)
CO2: 25 mmol/L (ref 22–32)
Calcium: 8.9 mg/dL (ref 8.9–10.3)
Chloride: 106 mmol/L (ref 101–111)
Creatinine, Ser: 0.46 mg/dL (ref 0.44–1.00)
Creatinine, Ser: 0.73 mg/dL (ref 0.44–1.00)
GFR calc non Af Amer: >60 mL/min (ref >60)
Glucose, Bld: 135 mg/dL — ABNORMAL HIGH (ref 65–99)
Glucose, Bld: 96 mg/dL (ref 65–99)
POTASSIUM: 3.6 mmol/L (ref 3.5–5.1)
Potassium: 3.9 mmol/L (ref 3.5–5.1)
Sodium: 136 mmol/L (ref 135–145)
Sodium: 140 mmol/L (ref 135–145)

## 2014-11-09 LAB — INFLUENZA PANEL BY PCR (TYPE A & B)
H1N1 flu by pcr: NOT DETECTED
INFLAPCR: NEGATIVE
INFLBPCR: NEGATIVE

## 2014-11-09 LAB — D-DIMER, QUANTITATIVE (NOT AT ARMC): D DIMER QUANT: 2.51 ug{FEU}/mL — AB (ref 0.00–0.48)

## 2014-11-09 LAB — TROPONIN I: TROPONIN I: 0.09 ng/mL — AB (ref <0.031)

## 2014-11-09 LAB — I-STAT TROPONIN, ED: Troponin i, poc: 0 ng/mL (ref 0.00–0.08)

## 2014-11-09 LAB — HIV ANTIBODY (ROUTINE TESTING W REFLEX): HIV SCREEN 4TH GENERATION: NONREACTIVE

## 2014-11-09 LAB — I-STAT CG4 LACTIC ACID, ED: Lactic Acid, Venous: 1.94 mmol/L (ref 0.5–2.0)

## 2014-11-09 MED ORDER — METHYLPREDNISOLONE SODIUM SUCC 125 MG IJ SOLR
125.0000 mg | Freq: Once | INTRAMUSCULAR | Status: AC
  Administered 2014-11-09: 125 mg via INTRAVENOUS
  Filled 2014-11-09: qty 2

## 2014-11-09 MED ORDER — CEFDINIR 300 MG PO CAPS: 600.0000 mg | ORAL_CAPSULE | Freq: Two times a day (BID) | ORAL | Status: DC

## 2014-11-09 MED ORDER — HYDROMORPHONE HCL 1 MG/ML IJ SOLN
0.5000 mg | Freq: Once | INTRAMUSCULAR | Status: AC
  Administered 2014-11-09: 0.5 mg via INTRAVENOUS
  Filled 2014-11-09: qty 1

## 2014-11-09 MED ORDER — IOHEXOL 350 MG/ML SOLN
80.0000 mL | Freq: Once | INTRAVENOUS | Status: AC | PRN
  Administered 2014-11-09: 80 mL via INTRAVENOUS

## 2014-11-09 MED ORDER — IPRATROPIUM-ALBUTEROL 0.5-2.5 (3) MG/3ML IN SOLN: 3.0000 mL | Freq: Two times a day (BID) | RESPIRATORY_TRACT | Status: DC

## 2014-11-09 MED ORDER — AZITHROMYCIN 500 MG PO TABS: 500.0000 mg | ORAL_TABLET | ORAL | Status: DC

## 2014-11-09 MED ORDER — IPRATROPIUM BROMIDE 0.02 % IN SOLN
0.5000 mg | Freq: Once | RESPIRATORY_TRACT | Status: AC
  Administered 2014-11-09: 0.5 mg via RESPIRATORY_TRACT
  Filled 2014-11-09: qty 2.5

## 2014-11-09 MED ORDER — HYDROCODONE-HOMATROPINE 5-1.5 MG/5ML PO SYRP
5.0000 mL | ORAL_SOLUTION | Freq: Four times a day (QID) | ORAL | Status: DC | PRN
Start: 2014-11-09 — End: 2015-06-01

## 2014-11-09 MED ORDER — ALBUTEROL SULFATE (2.5 MG/3ML) 0.083% IN NEBU
5.0000 mg | INHALATION_SOLUTION | Freq: Once | RESPIRATORY_TRACT | Status: AC
  Administered 2014-11-09: 5 mg via RESPIRATORY_TRACT
  Filled 2014-11-09: qty 6

## 2014-11-09 MED ORDER — ALBUTEROL SULFATE (2.5 MG/3ML) 0.083% IN NEBU: 5.0000 mg | INHALATION_SOLUTION | RESPIRATORY_TRACT | Status: DC | PRN

## 2014-11-09 MED ORDER — ONDANSETRON HCL 4 MG/2ML IJ SOLN
4.0000 mg | Freq: Once | INTRAMUSCULAR | Status: AC
  Administered 2014-11-09: 4 mg via INTRAVENOUS
  Filled 2014-11-09: qty 2

## 2014-11-09 MED ORDER — VANCOMYCIN HCL IN DEXTROSE 1-5 GM/200ML-% IV SOLN
1000.0000 mg | Freq: Once | INTRAVENOUS | Status: AC
  Administered 2014-11-09: 1000 mg via INTRAVENOUS
  Filled 2014-11-09: qty 200

## 2014-11-09 MED ORDER — SODIUM CHLORIDE 0.9 % IV BOLUS (SEPSIS)
500.0000 mL | Freq: Once | INTRAVENOUS | Status: AC
  Administered 2014-11-09: 500 mL via INTRAVENOUS

## 2014-11-09 MED ORDER — PIPERACILLIN-TAZOBACTAM 3.375 G IVPB 30 MIN
3.3750 g | Freq: Once | INTRAVENOUS | Status: AC
  Administered 2014-11-09: 3.375 g via INTRAVENOUS
  Filled 2014-11-09: qty 50

## 2014-11-09 MED ORDER — HYDROMORPHONE HCL 1 MG/ML IJ SOLN
1.0000 mg | Freq: Once | INTRAMUSCULAR | Status: AC
  Administered 2014-11-09: 1 mg via INTRAVENOUS
  Filled 2014-11-09: qty 1

## 2014-11-09 MED ORDER — FUROSEMIDE 20 MG PO TABS: 20.0000 mg | ORAL_TABLET | Freq: Every day | ORAL | Status: DC | PRN

## 2014-11-09 MED ORDER — SODIUM CHLORIDE 0.9 % IV SOLN: INTRAVENOUS | Status: DC

## 2014-11-09 NOTE — ED Provider Notes (Signed)
CSN: [id number]     Arrival date & time 11/09/14  2049 History   First MD Initiated Contact with Patient 11/09/14 2114     Chief Complaint  Patient presents with  . Shortness of Breath     (Consider location/radiation/quality/duration/timing/severity/associated sxs/prior Treatment) [name] presenting with shortness of breath. The history is provided by the patient.  Shortness of Breath Associated symptoms: chest pain, cough and fever   Associated symptoms: no abdominal pain, no headaches, no neck pain, no rash, no sore throat and no vomiting   Patient c/o increased right lateral, inferior chest pain, this afternoon after being discharged from hospital w dx bilateral pneumonia. Pt with hx copd. States was having similar pain/similar location since onset cough, and fever, but that pain became acutely worse at home, and feels more sob than when in hospital earlier. Symptoms constant, persistent. Worse w cough, deep breath, and movement. No chills/sweats. No nv. No abdominal pain. No back or flank pain.       Past Medical History  Diagnosis Date  . Asthma   . Obesity   . Maxillary sinusitis   . Osteoarthritis   . IBS (irritable bowel syndrome)   . Hyperlipidemia   . COPD (chronic obstructive pulmonary disease)   . Pulmonary nodules   . Osteoporosis   . Nasal congestion 9/12    current cold  . Cough   . Wheezing   . GERD (gastroesophageal reflux disease)   . H/O hiatal hernia   . Depression     mild  . Allergic bronchopulmonary aspergillosis 2008    sees Dr Edmund Hilda pulmonology  . Anemia     iron deficiency, resolved  . Normal cardiac stress test 11/2011  . OSA (obstructive sleep apnea) 02/2012    has stopped using  cpap  . Anginal pain   . Chronic bronchitis   . Exertional dyspnea     "sometimes lying down; always w/exertion" (04/13/2012)  . Headache(784.0)     "often but not daily" (04/13/2012)  . Anxiety   . Pneumonia 11/2011    "before  2013 I hadn't had pneumonia since I was a child" (04/13/2012)  . Schatzki's ring   . Pain in joint, ankle and foot 08/10/2014  . Palpitations 08/19/2008    Qualifier: Diagnosis of  By: Jerold Coombe     Past Surgical History  Procedure Laterality Date  . Cesarean section  1985  . Hernia repair  04/13/2012    VHR laparoscopic  . Appendectomy  1989  . Ventral hernia repair  04/13/2012    Procedure: LAPAROSCOPIC VENTRAL HERNIA;  Surgeon: Adin Hector, MD;  Location: [place];  Service: General;  Laterality: N/A;  laparoscopic repair of incarcerated hernia   Family History  Problem Relation Age of Onset  . Breast cancer Mother   . Prostate cancer Father   . Diverticulosis Father   . Prostate cancer Father     prostate  . Pulmonary embolism Brother     recurrent  . Breast cancer Sister     breast   History  Substance Use Topics  . Smoking status: Never Smoker   . Smokeless tobacco: Never Used  . Alcohol Use: 2.4 oz/week    4 Glasses of wine per week     Comment: 04/13/2012 "beer in the summer; wine 3-4 times/wk"   OB History    No data available     Review of Systems  Constitutional: Positive for fever. Negative for  chills.  HENT: Negative for sore throat.   Eyes: Negative for redness.  Respiratory: Positive for cough and shortness of breath.   Cardiovascular: Positive for chest pain. Negative for leg swelling.  Gastrointestinal: Negative for vomiting, abdominal pain and diarrhea.  Genitourinary: Negative for dysuria and flank pain.  Musculoskeletal: Negative for back pain and neck pain.  Skin: Negative for rash.  Neurological: Negative for headaches.  Hematological: Does not bruise/bleed easily.  Psychiatric/Behavioral: Negative for confusion.      Allergies  Beclomethasone dipropionate; Budesonide-formoterol fumarate; Sulfonamide derivatives; Mometasone furo-formoterol fum; and Statins  Home Medications   Prior to Admission medications   Medication Sig  Start Date End Date Taking? Authorizing Provider  ALPRAZolam Duanne Moron) 0.5 MG tablet Take 1 tablet (0.5 mg total) by mouth 2 (two) times daily as needed for anxiety (Palpatations, SOB). 07/29/14  Yes Mosie Lukes, MD  aspirin 81 MG tablet Take 81 mg by mouth daily.    Yes Historical Provider, MD  Cholecalciferol (VITAMIN D) 2000 UNITS CAPS Take 2,000 Units by mouth daily.    Yes Historical Provider, MD  diphenhydrAMINE (SOMINEX) 25 MG tablet Take 25 mg by mouth at bedtime as needed for itching.   Yes Historical Provider, MD  fexofenadine (ALLEGRA) 180 MG tablet Take 180 mg by mouth as needed for allergies.    Yes Historical Provider, MD  ibuprofen (ADVIL,MOTRIN) 200 MG tablet Take 200 mg by mouth every 6 (six) hours as needed for mild pain or moderate pain.   Yes Historical Provider, MD  ipratropium-albuterol (DUONEB) 0.5-2.5 (3) MG/3ML SOLN Inhale 3 mLs into the lungs 4 (four) times daily as needed. Patient taking differently: Inhale 3 mLs into the lungs 4 (four) times daily as needed (wheezing or shortness of breath).  05/19/14  Yes Brand Males, MD  mometasone (ASMANEX 60 METERED DOSES) 220 MCG/INH inhaler USE 2 INHALATIONS ORALLY EVERY EVENING 11/06/13  Yes Brand Males, MD  montelukast (SINGULAIR) 10 MG tablet TAKE 1 TABLET BY MOUTH TWICE DAILY Patient taking differently: Take 10 mg by mouth. TAKE 1 TABLET BY MOUTH once or TWICE DAILY 11/06/13  Yes Brand Males, MD  ranitidine (ZANTAC) 300 MG tablet Take 1 tablet (300 mg total) by mouth at bedtime. 04/23/14  Yes Mosie Lukes, MD  tiotropium (SPIRIVA HANDIHALER) 18 MCG inhalation capsule INHALE CONTENTS OF 1 CAPSULE THROUGH HANDIHALER DEVICE EVERY DAY 11/06/13  Yes Brand Males, MD  triamcinolone (NASACORT AQ) 55 MCG/ACT AERO nasal inhaler Place 2 sprays into the nose daily as needed (for allergies).    Yes Historical Provider, MD  venlafaxine XR (EFFEXOR-XR) 150 MG 24 hr capsule Take 150 mg by mouth daily with breakfast.   Yes  Historical Provider, MD  azithromycin (ZITHROMAX) 500 MG tablet Take 1 tablet (500 mg total) by mouth daily. 11/09/14   Reyne Dumas, MD  cefdinir (OMNICEF) 300 MG capsule Take 2 capsules (600 mg total) by mouth 2 (two) times daily. 11/09/14   Reyne Dumas, MD  furosemide (LASIX) 20 MG tablet Take 1 tablet (20 mg total) by mouth daily as needed. 11/17/14   Reyne Dumas, MD  HYDROcodone-homatropine (HYCODAN) 5-1.5 MG/5ML syrup Take 5 mLs by mouth every 6 (six) hours as needed for cough. 11/09/14   Reyne Dumas, MD   BP 104/79 mmHg  Pulse 96  Temp(Src) 97.9 F (36.6 C) (Oral)  Resp 20  Ht 5\' 2"  (1.575 m)  Wt 170 lb (77.111 kg)  BMI 31.09 kg/m2  SpO2 100% Physical Exam  Constitutional: She appears well-developed  and well-nourished. No distress.  HENT:  Nose: Nose normal.  Mouth/Throat: Oropharynx is clear and moist.  Eyes: Conjunctivae are normal. No scleral icterus.  Neck: Neck supple. No tracheal deviation present.  Cardiovascular: Normal rate, regular rhythm, normal heart sounds and intact distal pulses.  Exam reveals no gallop and no friction rub.   No murmur heard. Pulmonary/Chest: No respiratory distress. She exhibits tenderness.  Poor, shallow, insp effort. Diminished air exchange bil.   Abdominal: Soft. Normal appearance and bowel sounds are normal. She exhibits no distension and no mass. There is no tenderness. There is no rebound and no guarding.  Genitourinary:  No cva tenderness  Musculoskeletal: She exhibits no edema or tenderness.  Neurological: She is alert.  Skin: Skin is warm and dry. No rash noted. She is not diaphoretic.  Psychiatric: She has a normal mood and affect.  Nursing note and vitals reviewed.   ED Course  Procedures (including critical care time) Labs Review   Results for orders placed or performed during the hospital encounter of 11/09/14  CBC  (if pt has PMH of COPD)  Result Value Ref Range   WBC 13.7 (H) 4.0 - 10.5 K/uL   RBC 3.84 (L) 3.87 - 5.11  MIL/uL   Hemoglobin 11.0 (L) 12.0 - 15.0 g/dL   HCT 33.8 (L) 36.0 - 46.0 %   MCV 88.0 78.0 - 100.0 fL   MCH 28.6 26.0 - 34.0 pg   MCHC 32.5 30.0 - 36.0 g/dL   RDW 15.1 11.5 - 15.5 %   Platelets 378 150 - 400 K/uL  Basic metabolic panel  (if pt has PMH of COPD)  Result Value Ref Range   Sodium 140 135 - 145 mmol/L   Potassium 3.9 3.5 - 5.1 mmol/L   Chloride 105 101 - 111 mmol/L   CO2 25 22 - 32 mmol/L   Glucose, Bld 96 65 - 99 mg/dL   BUN 14 6 - 20 mg/dL   Creatinine, Ser 0.73 0.44 - 1.00 mg/dL   Calcium 8.9 8.9 - 10.3 mg/dL   GFR calc non Af Amer >60 >60 mL/min   GFR calc Af Amer >60 >60 mL/min   Anion gap 10 5 - 15  D-dimer, quantitative  Result Value Ref Range   D-Dimer, Quant 2.51 (H) 0.00 - 0.48 ug/mL-FEU  Troponin I  Result Value Ref Range   Troponin I 0.09 (H) <0.031 ng/mL  I-stat troponin, ED  (if patient has PMH of COPD)  not at [facility]  Result Value Ref Range   Troponin i, poc 0.00 0.00 - 0.08 ng/mL   Comment 3          I-Stat CG4 Lactic Acid, ED  Result Value Ref Range   Lactic Acid, Venous 1.94 0.5 - 2.0 mmol/L   Dg Chest 2 View  11/08/2014   CLINICAL DATA:  Three day history of shortness of breath and right-sided chest pain. Current history of asthma.  EXAM: CHEST  2 VIEW  COMPARISON:  05/30/2014 and earlier.  FINDINGS: Cardiomediastinal silhouette unremarkable, unchanged. Dense airspace consolidation in the right lower lobe with silhouetting of the right hemidiaphragm. Patchy airspace opacities in the left lower lobe. Stable linear scarring in the right upper lobe. No pleural effusions. Degenerative changes involving the thoracic spine with slight thoracic scoliosis convex right.  IMPRESSION: Bilateral lower lobe pneumonia, right greater than left.   Electronically Signed   By: Evangeline Dakin M.D.   On: 11/08/2014 13:34   Dg Chest Port 1  View  11/09/2014   CLINICAL DATA:  Right-sided chest pain and shortness of breath  EXAM: PORTABLE CHEST - 1 VIEW  COMPARISON:   Chest x-ray from yesterday  FINDINGS: Bilateral lower lobe airspace disease, progressed in density and extent on the left. Normal heart size. Stable upper mediastinal contours, with prominence and convexity of the right mediastinum accentuated by rotation and portable technique. Nnown sliding hiatal hernia and fatty left Bochdalek's hernia are partially visible through the heart. No effusion, edema, or pneumothorax.  IMPRESSION: Bibasilar pneumonia, progressed on the left since yesterday.   Electronically Signed   By: Monte Fantasia M.D.   On: 11/09/2014 21:38      Imaging Review Dg Chest 2 View  11/08/2014   CLINICAL DATA:  Three day history of shortness of breath and right-sided chest pain. Current history of asthma.  EXAM: CHEST  2 VIEW  COMPARISON:  05/30/2014 and earlier.  FINDINGS: Cardiomediastinal silhouette unremarkable, unchanged. Dense airspace consolidation in the right lower lobe with silhouetting of the right hemidiaphragm. Patchy airspace opacities in the left lower lobe. Stable linear scarring in the right upper lobe. No pleural effusions. Degenerative changes involving the thoracic spine with slight thoracic scoliosis convex right.  IMPRESSION: Bilateral lower lobe pneumonia, right greater than left.   Electronically Signed   By: Evangeline Dakin M.D.   On: 11/08/2014 13:34     EKG Interpretation   Date/Time:  Sunday Nov 09 2014 20:57:55 EDT Ventricular Rate:  96 PR Interval:  158 QRS Duration: 82 QT Interval:  350 QTC Calculation: 442 R Axis:   74 Text Interpretation:  Sinus rhythm Baseline wander in lead(s) V3 V4 V5 No  significant change since last tracing Confirmed by Ashok Cordia  MD, Lennette Bihari  ([id number]) on 11/09/2014 9:24:43 PM      MDM   Iv ns.   Reviewed nursing notes and prior charts for additional history.   Dilaudid .5 mg iv. zofran iv.   Albuterol/atrovent neb. Pt requests steroid rx iv. Solumedrol iv.   Labs. Pcxr.  Pain transiently improved but  recurs.  Dilaudid 1 mg iv.    Recheck chest, no signif change from prior exam.   ddimer markedly elevated - as right cp acutely worse, pleuritic, sob worse from prior today, will get ct angio chest r/o pe (vs worsening pna, pleurisy).  As progressive left infiltrate and recent hospitalization, will rx vanc and zosyn.  Medical service contacted for admission.    Lajean Saver, MD 11/09/14 [phone]

## 2014-11-09 NOTE — H&P (Signed)
[facility] History and Physical  Victoria [name] YKD:[id number] DOB: Feb 19, 1956 DOA: 11/09/2014  Referring physician: ED physician PCP: [name], MD  Specialists:   Chief Complaint: Chest pain, shortness of breath  HPI: Victoria [name] is a 59 y.o. female with PMH of hyperlipidemia, GERD, depression, anxiety, OSA, asthma, history of ABPA, recently treated CAP (5/21 to 11/09/14), who presents with worsening chest pain or shortness of breath.  Patient was admitted from 5/21 to 5/22 and treated for comminuted acquired pneumonia with antibiotics. She had negative flu workup. He was initially treated with Rocephin and azithromycin, and discharged on Omnicef and azithromycin for 7 days. He was adjusted discharged today. She reports that after she went home, she developed severe chest pain tonight. The chest pain is located in the right side of chest, severe, sharp, persistent, nonradiating. It is pleuritic in nature. It is associated with shortness of breath, subjective fever and chills. She does not have cough.   Currently patient denies running nose, ear pain, headaches, abdominal pain, diarrhea, constipation, dysuria, urgency, frequency, hematuria, skin rashes or leg swelling. No unilateral weakness, numbness or tingling sensations. No vision change or hearing loss.  In ED, patient was found to have WBC 13.7, tachycardia, temperature normal, no oxygen desaturation, troponin negative, lactate 1.94. Chest x-ray showed worsening bilateral pneumonia. D-dimer positive, but CTA is negative for PE.  Where does patient live?   At home    Can patient participate in ADLs?  Some   Review of Systems:   General: has fevers, chills, no changes in body weight, has poor appetite, has fatigue HEENT: no blurry vision, hearing changes or sore throat Pulm: has dyspnea, no coughing, wheezing CV: has chest pain, no palpitations Abd: no nausea, vomiting, abdominal pain, diarrhea, constipation GU: no dysuria,  burning on urination, increased urinary frequency, hematuria  Ext: no leg edema Neuro: no unilateral weakness, numbness, or tingling, no vision change or hearing loss Skin: no rash MSK: No muscle spasm, no deformity, no limitation of range of movement in spin Heme: No easy bruising.  Travel history: No recent long distant travel.  Allergy:  Allergies  Allergen Reactions  . Beclomethasone Dipropionate Hives and Other (See Comments)     weight gain  . Budesonide-Formoterol Fumarate Hives  . Sulfonamide Derivatives Hives and Rash  . Mometasone Furo-Formoterol Fum Hives and Other (See Comments)    weight gain  . Statins     Myalgias, RLS    Past Medical History  Diagnosis Date  . Asthma   . Obesity   . Maxillary sinusitis   . Osteoarthritis   . IBS (irritable bowel syndrome)   . Hyperlipidemia   . COPD (chronic obstructive pulmonary disease)   . Pulmonary nodules   . Osteoporosis   . Nasal congestion 9/12    current cold  . Cough   . Wheezing   . GERD (gastroesophageal reflux disease)   . H/O hiatal hernia   . Depression     mild  . Allergic bronchopulmonary aspergillosis 2008    sees Dr Edmund Hilda pulmonology  . Anemia     iron deficiency, resolved  . Normal cardiac stress test 11/2011  . OSA (obstructive sleep apnea) 02/2012    has stopped using  cpap  . Anginal pain   . Chronic bronchitis   . Exertional dyspnea     "sometimes lying down; always w/exertion" (04/13/2012)  . Headache(784.0)     "often but not daily" (04/13/2012)  . Anxiety   . Pneumonia 11/2011    "  before 2013 I hadn't had pneumonia since I was a child" (04/13/2012)  . Schatzki's ring   . Pain in joint, ankle and foot 08/10/2014  . Palpitations 08/19/2008    Qualifier: Diagnosis of  By: Jerold Coombe      Past Surgical History  Procedure Laterality Date  . Cesarean section  1985  . Hernia repair  04/13/2012    VHR laparoscopic  . Appendectomy  1989  . Ventral hernia repair   04/13/2012    Procedure: LAPAROSCOPIC VENTRAL HERNIA;  Surgeon: Adin Hector, MD;  Location: [place];  Service: General;  Laterality: N/A;  laparoscopic repair of incarcerated hernia    Social History:  reports that she has never smoked. She has never used smokeless tobacco. She reports that she drinks about 2.4 oz of alcohol per week. She reports that she does not use illicit drugs.  Family History:  Family History  Problem Relation Age of Onset  . Breast cancer Mother   . Prostate cancer Father   . Diverticulosis Father   . Prostate cancer Father     prostate  . Pulmonary embolism Brother     recurrent  . Breast cancer Sister     breast     Prior to Admission medications   Medication Sig Start Date End Date Taking? Authorizing Provider  ALPRAZolam Duanne Moron) 0.5 MG tablet Take 1 tablet (0.5 mg total) by mouth 2 (two) times daily as needed for anxiety (Palpatations, SOB). 07/29/14  Yes Mosie Lukes, MD  aspirin 81 MG tablet Take 81 mg by mouth daily.    Yes Historical Provider, MD  Cholecalciferol (VITAMIN D) 2000 UNITS CAPS Take 2,000 Units by mouth daily.    Yes Historical Provider, MD  diphenhydrAMINE (SOMINEX) 25 MG tablet Take 25 mg by mouth at bedtime as needed for itching.   Yes Historical Provider, MD  fexofenadine (ALLEGRA) 180 MG tablet Take 180 mg by mouth as needed for allergies.    Yes Historical Provider, MD  ibuprofen (ADVIL,MOTRIN) 200 MG tablet Take 200 mg by mouth every 6 (six) hours as needed for mild pain or moderate pain.   Yes Historical Provider, MD  ipratropium-albuterol (DUONEB) 0.5-2.5 (3) MG/3ML SOLN Inhale 3 mLs into the lungs 4 (four) times daily as needed. Patient taking differently: Inhale 3 mLs into the lungs 4 (four) times daily as needed (wheezing or shortness of breath).  05/19/14  Yes Brand Males, MD  mometasone (ASMANEX 60 METERED DOSES) 220 MCG/INH inhaler USE 2 INHALATIONS ORALLY EVERY EVENING 11/06/13  Yes Brand Males, MD  montelukast  (SINGULAIR) 10 MG tablet TAKE 1 TABLET BY MOUTH TWICE DAILY Patient taking differently: Take 10 mg by mouth. TAKE 1 TABLET BY MOUTH once or TWICE DAILY 11/06/13  Yes Brand Males, MD  ranitidine (ZANTAC) 300 MG tablet Take 1 tablet (300 mg total) by mouth at bedtime. 04/23/14  Yes Mosie Lukes, MD  tiotropium (SPIRIVA HANDIHALER) 18 MCG inhalation capsule INHALE CONTENTS OF 1 CAPSULE THROUGH HANDIHALER DEVICE EVERY DAY 11/06/13  Yes Brand Males, MD  triamcinolone (NASACORT AQ) 55 MCG/ACT AERO nasal inhaler Place 2 sprays into the nose daily as needed (for allergies).    Yes Historical Provider, MD  venlafaxine XR (EFFEXOR-XR) 150 MG 24 hr capsule Take 150 mg by mouth daily with breakfast.   Yes Historical Provider, MD  azithromycin (ZITHROMAX) 500 MG tablet Take 1 tablet (500 mg total) by mouth daily. 11/09/14   Reyne Dumas, MD  cefdinir (OMNICEF) 300 MG capsule  Take 2 capsules (600 mg total) by mouth 2 (two) times daily. 11/09/14   Reyne Dumas, MD  furosemide (LASIX) 20 MG tablet Take 1 tablet (20 mg total) by mouth daily as needed. 11/17/14   Reyne Dumas, MD  HYDROcodone-homatropine (HYCODAN) 5-1.5 MG/5ML syrup Take 5 mLs by mouth every 6 (six) hours as needed for cough. 11/09/14   Reyne Dumas, MD    Physical Exam: Filed Vitals:   11/10/14 0003 11/10/14 0057 11/10/14 0358 11/10/14 0422  BP: 112/64   119/65  Pulse: 113 109 101 99  Temp: 99.4 F (37.4 C)   98.1 F (36.7 C)  TempSrc: Oral   Oral  Resp: 15 19 19 19   Height: 5\' 2"  (1.575 m)     Weight: 79.8 kg (175 lb 14.8 oz)     SpO2: 98% 96% 92% 97%   General: Not in acute distress HEENT:       Eyes: PERRL, EOMI, no scleral icterus.       ENT: No discharge from the ears and nose, no pharynx injection, no tonsillar enlargement.        Neck: No JVD, no bruit, no mass felt. Heme: No neck lymph node enlargement. Cardiac: S1/S2, RRR, No murmurs, No gallops or rubs. Pulm: has mild wheezing bilaterally, no rales or rubs. Abd: Soft,  nondistended, nontender, no rebound pain, no organomegaly, BS present. Ext: No pitting leg edema bilaterally. 2+DP/PT pulse bilaterally. Musculoskeletal: No joint deformities, No joint redness or warmth, no limitation of ROM in spin. Skin: No rashes.  Neuro: Alert, oriented X3, cranial nerves II-XII grossly intact, muscle strength 5/5 in all extremities, sensation to light touch intact. Psych: Patient is not psychotic, no suicidal or hemocidal ideation.  Labs on Admission:  Basic Metabolic Panel:  Recent Labs Lab 11/08/14 1430 11/08/14 1930 11/09/14 0637 11/09/14 2100 11/10/14 0302  NA 135  --  136 140 138  K 3.8  --  3.6 3.9 3.9  CL 103  --  106 105 107  CO2 24  --  22 25 25   GLUCOSE 120*  --  135* 96 167*  BUN 8  --  7 14 10   CREATININE 0.56 0.63 0.46 0.73 0.57  CALCIUM 8.8*  --  8.4* 8.9 8.0*   Liver Function Tests:  Recent Labs Lab 11/10/14 0302  AST 45*  ALT 66*  ALKPHOS 167*  BILITOT 0.3  PROT 5.9*  ALBUMIN 2.8*   No results for input(s): LIPASE, AMYLASE in the last 168 hours. No results for input(s): AMMONIA in the last 168 hours. CBC:  Recent Labs Lab 11/08/14 1430 11/08/14 1930 11/09/14 0637 11/09/14 2100 11/10/14 0302  WBC 12.7* 9.5 8.7 13.7* 11.1*  NEUTROABS 10.3*  --   --   --   --   HGB 12.0 11.2* 11.2* 11.0* 10.5*  HCT 36.4 34.8* 34.5* 33.8* 32.8*  MCV 88.6 87.9 87.8 88.0 88.6  PLT 305 307 309 378 329   Cardiac Enzymes:  Recent Labs Lab 11/09/14 2100 11/10/14 0302  TROPONINI 0.09* <0.03    BNP (last 3 results) No results for input(s): BNP in the last 8760 hours.  ProBNP (last 3 results) No results for input(s): PROBNP in the last 8760 hours.  CBG: No results for input(s): GLUCAP in the last 168 hours.  Radiological Exams on Admission: Dg Chest 2 View  11/08/2014   CLINICAL DATA:  Three day history of shortness of breath and right-sided chest pain. Current history of asthma.  EXAM: CHEST  2 VIEW  COMPARISON:  05/30/2014 and  earlier.  FINDINGS: Cardiomediastinal silhouette unremarkable, unchanged. Dense airspace consolidation in the right lower lobe with silhouetting of the right hemidiaphragm. Patchy airspace opacities in the left lower lobe. Stable linear scarring in the right upper lobe. No pleural effusions. Degenerative changes involving the thoracic spine with slight thoracic scoliosis convex right.  IMPRESSION: Bilateral lower lobe pneumonia, right greater than left.   Electronically Signed   By: Evangeline Dakin M.D.   On: 11/08/2014 13:34   Ct Angio Chest Pe W/cm &/or Wo Cm  11/09/2014   CLINICAL DATA:  Recent diagnosis of bilateral pneumonia, now with right-sided rib pain. Evaluate for pulmonary embolism.  EXAM: CT ANGIOGRAPHY CHEST WITH CONTRAST  TECHNIQUE: Multidetector CT imaging of the chest was performed using the standard protocol during bolus administration of intravenous contrast. Multiplanar CT image reconstructions and MIPs were obtained to evaluate the vascular anatomy.  CONTRAST:  19mL OMNIPAQUE IOHEXOL 350 MG/ML SOLN  COMPARISON:  11/09/2014; 11/08/2014; chest CT - 05/22/2014  FINDINGS: Vascular Findings:  There is adequate opacification of the pulmonary arterial system with the main pulmonary artery measuring 278 Hounsfield units. There no discrete filling defects within the pulmonary arterial tree to suggest pulmonary embolism. Normal caliber the main pulmonary artery.  Borderline cardiomegaly.  No pericardial effusion.  Normal caliber of the thoracic aorta. No definite thoracic aortic dissection on this nongated examination. Bovine configuration of the aortic arch. The branch vessels of the aortic arch appear widely patent throughout their imaged course.  Review of the MIP images confirms the above findings.   ----------------------------------------------------------------------------------  Nonvascular Findings:  Evaluation the pulmonary parenchyma is degraded secondary to patient respiratory artifact.  Ill-defined heterogeneous and consolidative airspace opacities within in the anterior and medial basilar segments of the right lower lobe (representative image 66, series 400, compatible with the findings on recently obtained chest radiograph. There is minimal subsegmental atelectasis within the left costophrenic angle. There is grossly unchanged biapical pleural parenchymal thickening.  Note is again made of a mesenteric fat containing Bochdalek as well as a moderate-sized hiatal hernia.  The approximately 6 mm nodule within the right costophrenic angle (68, series 407) is grossly unchanged since the 11/2011 examination. Evaluation for additional discrete pulmonary nodules is degraded secondary to presumed infectious process within the right lower lobe. Scattered shotty mediastinal lymph nodes are individually not enlarged by size criteria with index precarinal lymph node measuring 0.8 cm in greatest short axis diameter (image 35, series 401). No definitive mediastinal, hilar or axillary lymphadenopathy.  Early arterial phase evaluation of the upper abdomen demonstrates an ill-defined hypo attenuating lesion within the right lobe of the liver (image 110, series 401) which is incompletely imaged, likely too small to adequately characterize may represent hepatic cysts.  No acute or aggressive osseous abnormalities. Regional soft tissues appear normal. Normal appearance of the thyroid gland.  IMPRESSION: 1. No evidence of pulmonary embolism. 2. Findings again worrisome for right lower lobe pneumonia. A follow-up chest radiograph in 3-4 weeks after treatment is recommended to ensure resolution. 3. Punctate approximately 6 mm nodule within the right lower lobe is grossly unchanged since the 11/2011 examination and thus of benign etiology - evaluation for additional discrete pulmonary nodules is degraded secondary to concomitant superimposed acute infectious process within the right lower lobe. 4. Unchanged biapical  pleural parenchymal thickening. 5. Unchanged moderate-sized Bochdalek and hiatal hernias.   Electronically Signed   By: Sandi Mariscal M.D.   On: 11/09/2014 23:32   Dg Chest Port 1  View  11/09/2014   CLINICAL DATA:  Right-sided chest pain and shortness of breath  EXAM: PORTABLE CHEST - 1 VIEW  COMPARISON:  Chest x-ray from yesterday  FINDINGS: Bilateral lower lobe airspace disease, progressed in density and extent on the left. Normal heart size. Stable upper mediastinal contours, with prominence and convexity of the right mediastinum accentuated by rotation and portable technique. Nnown sliding hiatal hernia and fatty left Bochdalek's hernia are partially visible through the heart. No effusion, edema, or pneumothorax.  IMPRESSION: Bibasilar pneumonia, progressed on the left since yesterday.   Electronically Signed   By: Monte Fantasia M.D.   On: 11/09/2014 21:38    EKG: Independently reviewed. No ischemic change.   Assessment/Plan Principal Problem:   SOB (shortness of breath) Active Problems:   Obesity   Depression   Esophageal reflux   Osteoarthritis   OSA (obstructive sleep apnea)   Anxiety state   Asthma, severe persistent   CAP (community acquired pneumonia)   Pleuritic chest pain  CAP and sepsis: her chest pain and shortness of breath are most likely due to worsening CAP as evidenced by x-ray. No pulmonary embolism by CTA. Patient is mildly septic on admission with tachycardia and leukocytosis. Hemodynamically stable. Patient had negative flu pcr test in previous admission.  - Will admit to SDU - IV Vancomycin and zosyn - Mucinex for cough  - albuterol, Duoneb Neb for SOB - Urine legionella and S. pneumococcal antigen - Follow up blood culture x2, sputum culture - will get Procalcitonin and trend lactic acid level per sepsis protocol - IVF: 2L of NS bolus in ED, followed by 100 mL per hour of NS  [place]-severe: Has mild wheezing on auscultation, may have mild exacerbation. -  Albuterol, Duoneb Neb for SOB - Continue home Singulair - solumedrol 60 mg q8h  Depression/Anxiety: Stable, no suicidal or homicidal ideations. -Continue effexor and xanax   Esophageal reflux -pepcide      DVT ppx: SQ Heparin   Code Status: Full code Family Communication: None at bed side. Disposition Plan: Admit to inpatient   Date of Service 11/10/2014    Ivor Costa [facility] Pager [phone]  If 7PM-7AM, please contact night-coverage [email or web address] Password [facility] 11/10/2014, 5:39 AM

## 2014-11-09 NOTE — Progress Notes (Signed)
Patient discharge teaching given, including activity, diet, follow-up appoints, and medications. Patient verbalized understanding of all discharge instructions. IV access was d/c'd. Vitals are stable. Skin is intact except as charted in most recent assessments. Pt to be escorted out by NT, to be driven home by family. 

## 2014-11-09 NOTE — Discharge Summary (Signed)
Physician Discharge Summary  Victoria [name] MRN: [id number] DOB/AGE: 1956/03/29 59 y.o.  PCP: [name], MD   Admit date: 11/08/2014 Discharge date: 11/09/2014  Discharge Diagnoses:     Active Problems:   Depression   A B P A-ALLERGIC BRONCHOPULMONARY ASPERGILLOSIS   Esophageal reflux   Environmental allergies   Chest pain, atypical   Asthma, severe persistent   Pneumonia   Bilateral pneumonia  Follow-up with PCP in 3-5 days Follow-up CBC, CMP in 1 week      Medication List    STOP taking these medications        ibuprofen 200 MG tablet  Commonly known as:  ADVIL,MOTRIN      TAKE these medications        ALPRAZolam 0.5 MG tablet  Commonly known as:  XANAX  Take 1 tablet (0.5 mg total) by mouth 2 (two) times daily as needed for anxiety (Palpatations, SOB).     aspirin 81 MG tablet  Take 81 mg by mouth daily.     azithromycin 500 MG tablet  Commonly known as:  ZITHROMAX  Take 1 tablet (500 mg total) by mouth daily.     cefdinir 300 MG capsule  Commonly known as:  OMNICEF  Take 2 capsules (600 mg total) by mouth 2 (two) times daily.     esomeprazole 20 MG capsule  Commonly known as:  NEXIUM  Take 20 mg by mouth daily at 12 noon.     fexofenadine 180 MG tablet  Commonly known as:  ALLEGRA  Take 180 mg by mouth as needed.     furosemide 20 MG tablet  Commonly known as:  LASIX  Take 1 tablet (20 mg total) by mouth daily as needed.  Start taking on:  11/17/2014     HYDROcodone-homatropine 5-1.5 MG/5ML syrup  Commonly known as:  HYCODAN  Take 5 mLs by mouth every 6 (six) hours as needed for cough.     ipratropium-albuterol 0.5-2.5 (3) MG/3ML Soln  Commonly known as:  DUONEB  Inhale 3 mLs into the lungs 4 (four) times daily as needed.     mometasone 220 MCG/INH inhaler  Commonly known as:  ASMANEX 60 METERED DOSES  USE 2 INHALATIONS ORALLY EVERY EVENING     montelukast 10 MG tablet  Commonly known as:  SINGULAIR  TAKE 1 TABLET BY MOUTH TWICE DAILY      NAC 600 MG Caps  Generic drug:  Acetylcysteine  Take 1 capsule by mouth 2 (two) times daily.     NASACORT AQ 55 MCG/ACT Aero nasal inhaler  Generic drug:  triamcinolone  Place 2 sprays into the nose as needed.     PROAIR HFA 108 (90 BASE) MCG/ACT inhaler  Generic drug:  albuterol  Inhale 2 puffs into the lungs every 6 (six) hours as needed. For shortness of breath     ranitidine 300 MG tablet  Commonly known as:  ZANTAC  Take 1 tablet (300 mg total) by mouth at bedtime.     tiotropium 18 MCG inhalation capsule  Commonly known as:  SPIRIVA HANDIHALER  INHALE CONTENTS OF 1 CAPSULE THROUGH HANDIHALER DEVICE EVERY DAY     venlafaxine XR 150 MG 24 hr capsule  Commonly known as:  EFFEXOR-XR  Take 150 mg by mouth daily with breakfast.     Vitamin D 2000 UNITS Caps  Take 2,000 Units by mouth.         Discharge Condition: Stable   Disposition: 01-Home or Self Care   Consults:  None   Significant Diagnostic Studies:  Dg Chest 2 View  11/08/2014   CLINICAL DATA:  Three day history of shortness of breath and right-sided chest pain. Current history of asthma.  EXAM: CHEST  2 VIEW  COMPARISON:  05/30/2014 and earlier.  FINDINGS: Cardiomediastinal silhouette unremarkable, unchanged. Dense airspace consolidation in the right lower lobe with silhouetting of the right hemidiaphragm. Patchy airspace opacities in the left lower lobe. Stable linear scarring in the right upper lobe. No pleural effusions. Degenerative changes involving the thoracic spine with slight thoracic scoliosis convex right.  IMPRESSION: Bilateral lower lobe pneumonia, right greater than left.   Electronically Signed   By: Evangeline Dakin M.D.   On: 11/08/2014 13:34      Filed Weights   11/08/14 1253  Weight: 76.658 kg (169 lb)     Microbiology: Recent Results (from the past 240 hour(s))  Blood culture (routine x 2)     Status: None (Preliminary result)   Collection Time: 11/08/14  2:30 PM  Result  Value Ref Range Status   Specimen Description BLOOD LEFT ARM  Final   Special Requests BOTTLES DRAWN AEROBIC AND ANAEROBIC 5CC EACH  Final   Culture   Final           BLOOD CULTURE RECEIVED NO GROWTH TO DATE CULTURE WILL BE HELD FOR 5 DAYS BEFORE ISSUING A FINAL NEGATIVE REPORT Performed at [facility]    Report Status PENDING  Incomplete  Blood culture (routine x 2)     Status: None (Preliminary result)   Collection Time: 11/08/14  3:25 PM  Result Value Ref Range Status   Specimen Description BLOOD RIGHT ARM  Final   Special Requests BOTTLES DRAWN AEROBIC AND ANAEROBIC 5CC EACH  Final   Culture   Final           BLOOD CULTURE RECEIVED NO GROWTH TO DATE CULTURE WILL BE HELD FOR 5 DAYS BEFORE ISSUING A FINAL NEGATIVE REPORT Performed at [facility]    Report Status PENDING  Incomplete       Blood Culture    Component Value Date/Time   SDES BLOOD RIGHT ARM 11/08/2014 1525   SPECREQUEST BOTTLES DRAWN AEROBIC AND ANAEROBIC 5CC EACH 11/08/2014 1525   CULT  11/08/2014 1525           BLOOD CULTURE RECEIVED NO GROWTH TO DATE CULTURE WILL BE HELD FOR 5 DAYS BEFORE ISSUING A FINAL NEGATIVE REPORT Performed at [facility] PENDING 11/08/2014 1525      Labs: Results for orders placed or performed during the hospital encounter of 11/08/14 (from the past 48 hour(s))  Urinalysis, Routine w reflex microscopic     Status: Abnormal   Collection Time: 11/08/14  1:11 PM  Result Value Ref Range   Color, Urine AMBER (A) YELLOW    Comment: BIOCHEMICALS MAY BE AFFECTED BY COLOR   APPearance CLEAR CLEAR   Specific Gravity, Urine 1.022 1.005 - 1.030   pH 7.5 5.0 - 8.0   Glucose, UA NEGATIVE NEGATIVE mg/dL   Hgb urine dipstick NEGATIVE NEGATIVE   Bilirubin Urine SMALL (A) NEGATIVE   Ketones, ur 15 (A) NEGATIVE mg/dL   Protein, ur 30 (A) NEGATIVE mg/dL   Urobilinogen, UA 1.0 0.0 - 1.0 mg/dL   Nitrite NEGATIVE NEGATIVE   Leukocytes, UA TRACE (A)  NEGATIVE  Urine microscopic-add on     Status: Abnormal   Collection Time: 11/08/14  1:11 PM  Result Value Ref Range  Squamous Epithelial / LPF RARE RARE   WBC, UA 0-2 <3 WBC/hpf   Bacteria, UA MANY (A) RARE   Urine-Other MUCOUS PRESENT   CBC with Differential     Status: Abnormal   Collection Time: 11/08/14  2:30 PM  Result Value Ref Range   WBC 12.7 (H) 4.0 - 10.5 K/uL   RBC 4.11 3.87 - 5.11 MIL/uL   Hemoglobin 12.0 12.0 - 15.0 g/dL   HCT 36.4 36.0 - 46.0 %   MCV 88.6 78.0 - 100.0 fL   MCH 29.2 26.0 - 34.0 pg   MCHC 33.0 30.0 - 36.0 g/dL   RDW 14.6 11.5 - 15.5 %   Platelets 305 150 - 400 K/uL   Neutrophils Relative % 81 (H) 43 - 77 %   Neutro Abs 10.3 (H) 1.7 - 7.7 K/uL   Lymphocytes Relative 8 (L) 12 - 46 %   Lymphs Abs 1.0 0.7 - 4.0 K/uL   Monocytes Relative 9 3 - 12 %   Monocytes Absolute 1.1 (H) 0.1 - 1.0 K/uL   Eosinophils Relative 2 0 - 5 %   Eosinophils Absolute 0.2 0.0 - 0.7 K/uL   Basophils Relative 0 0 - 1 %   Basophils Absolute 0.0 0.0 - 0.1 K/uL  Basic metabolic panel     Status: Abnormal   Collection Time: 11/08/14  2:30 PM  Result Value Ref Range   Sodium 135 135 - 145 mmol/L   Potassium 3.8 3.5 - 5.1 mmol/L   Chloride 103 101 - 111 mmol/L   CO2 24 22 - 32 mmol/L   Glucose, Bld 120 (H) 65 - 99 mg/dL   BUN 8 6 - 20 mg/dL   Creatinine, Ser 0.56 0.44 - 1.00 mg/dL   Calcium 8.8 (L) 8.9 - 10.3 mg/dL   GFR calc non Af Amer >60 >60 mL/min   GFR calc Af Amer >60 >60 mL/min    Comment: (NOTE) The eGFR has been calculated using the CKD EPI equation. This calculation has not been validated in all clinical situations. eGFR's persistently <60 mL/min signify possible Chronic Kidney Disease.    Anion gap 8 5 - 15  Blood culture (routine x 2)     Status: None (Preliminary result)   Collection Time: 11/08/14  2:30 PM  Result Value Ref Range   Specimen Description BLOOD LEFT ARM    Special Requests BOTTLES DRAWN AEROBIC AND ANAEROBIC 5CC EACH    Culture              BLOOD CULTURE RECEIVED NO GROWTH TO DATE CULTURE WILL BE HELD FOR 5 DAYS BEFORE ISSUING A FINAL NEGATIVE REPORT Performed at [facility]    Report Status PENDING   I-Stat CG4 Lactic Acid, ED     Status: None   Collection Time: 11/08/14  2:46 PM  Result Value Ref Range   Lactic Acid, Venous 1.16 0.5 - 2.0 mmol/L  Blood culture (routine x 2)     Status: None (Preliminary result)   Collection Time: 11/08/14  3:25 PM  Result Value Ref Range   Specimen Description BLOOD RIGHT ARM    Special Requests BOTTLES DRAWN AEROBIC AND ANAEROBIC 5CC EACH    Culture             BLOOD CULTURE RECEIVED NO GROWTH TO DATE CULTURE WILL BE HELD FOR 5 DAYS BEFORE ISSUING A FINAL NEGATIVE REPORT Performed at [facility]    Report Status PENDING   HIV antibody  Status: None   Collection Time: 11/08/14  7:30 PM  Result Value Ref Range   HIV Screen 4th Generation wRfx Non Reactive Non Reactive    Comment: (NOTE) Performed At: [facility] [place], [place] [id number] Lindon Romp MD YK:[id number]   CBC     Status: Abnormal   Collection Time: 11/08/14  7:30 PM  Result Value Ref Range   WBC 9.5 4.0 - 10.5 K/uL   RBC 3.96 3.87 - 5.11 MIL/uL   Hemoglobin 11.2 (L) 12.0 - 15.0 g/dL   HCT 34.8 (L) 36.0 - 46.0 %   MCV 87.9 78.0 - 100.0 fL   MCH 28.3 26.0 - 34.0 pg   MCHC 32.2 30.0 - 36.0 g/dL   RDW 14.8 11.5 - 15.5 %   Platelets 307 150 - 400 K/uL  Creatinine, serum     Status: None   Collection Time: 11/08/14  7:30 PM  Result Value Ref Range   Creatinine, Ser 0.63 0.44 - 1.00 mg/dL   GFR calc non Af Amer >60 >60 mL/min   GFR calc Af Amer >60 >60 mL/min    Comment: (NOTE) The eGFR has been calculated using the CKD EPI equation. This calculation has not been validated in all clinical situations. eGFR's persistently <60 mL/min signify possible Chronic Kidney Disease.   Basic metabolic panel     Status: Abnormal   Collection Time: 11/09/14  6:37 AM   Result Value Ref Range   Sodium 136 135 - 145 mmol/L   Potassium 3.6 3.5 - 5.1 mmol/L   Chloride 106 101 - 111 mmol/L   CO2 22 22 - 32 mmol/L   Glucose, Bld 135 (H) 65 - 99 mg/dL   BUN 7 6 - 20 mg/dL   Creatinine, Ser 0.46 0.44 - 1.00 mg/dL   Calcium 8.4 (L) 8.9 - 10.3 mg/dL   GFR calc non Af Amer >60 >60 mL/min   GFR calc Af Amer >60 >60 mL/min    Comment: (NOTE) The eGFR has been calculated using the CKD EPI equation. This calculation has not been validated in all clinical situations. eGFR's persistently <60 mL/min signify possible Chronic Kidney Disease.    Anion gap 8 5 - 15  CBC     Status: Abnormal   Collection Time: 11/09/14  6:37 AM  Result Value Ref Range   WBC 8.7 4.0 - 10.5 K/uL   RBC 3.93 3.87 - 5.11 MIL/uL   Hemoglobin 11.2 (L) 12.0 - 15.0 g/dL   HCT 34.5 (L) 36.0 - 46.0 %   MCV 87.8 78.0 - 100.0 fL   MCH 28.5 26.0 - 34.0 pg   MCHC 32.5 30.0 - 36.0 g/dL   RDW 15.0 11.5 - 15.5 %   Platelets 309 150 - 400 K/uL  Influenza panel by PCR (type A & B, H1N1)     Status: None   Collection Time: 11/09/14  6:50 AM  Result Value Ref Range   Influenza A By PCR NEGATIVE NEGATIVE   Influenza B By PCR NEGATIVE NEGATIVE   H1N1 flu by pcr NOT DETECTED NOT DETECTED    Comment:        The Xpert Flu assay (FDA approved for nasal aspirates or washes and nasopharyngeal swab specimens), is intended as an aid in the diagnosis of influenza and should not be used as a sole basis for treatment.      Lipid Panel     Component Value Date/Time   CHOL 186 09/04/2012 1425  TRIG 90.0 09/04/2012 1425   HDL 43.30 09/04/2012 1425   CHOLHDL 4 09/04/2012 1425   VLDL 18.0 09/04/2012 1425   LDLCALC 125* 09/04/2012 1425   LDLDIRECT 177.8 09/07/2011 0945     Lab Results  Component Value Date   HGBA1C  10/22/2008    5.7 (NOTE) The ADA recommends the following therapeutic goal for glycemic control related to Hgb A1c measurement: Goal of therapy: <6.5 Hgb A1c  Reference: American  Diabetes Association: Clinical Practice Recommendations 2010, Diabetes Care, 2010, 33: (Suppl  1).     Lab Results  Component Value Date   LDLCALC 125* 09/04/2012   CREATININE 0.46 11/09/2014     HPI :Victoria [name] is a 59yo woman with PMH of Asthma, ABPA, allergies, GERD, Depression/anxiety, HLD who presents with fever X 3 days. She reports that she was on vacation when she started having chills and fever. She checked her temperature and the highest was 101.25F, which resolved with tylenol. Yesterday, she developed pain with deep breathing particularly under the right breast and on the right anterior chest today. She had similar symptoms when she last had pneumonia 3 years ago, so she was concerned. She has chronic pulmonary issues including asthma and allergic bronchopulmonary aspergillosis for which she follows with Pulmonary (Dr. Chase Caller). She reports being SOB and fatigued outside of her normal since March. She has not had a flare of her asthma or been on steroids since December of last year. She denies wheezing, but she did check her pulse Ox, which was normal, but her pulse was 114 when she was having a fever. She has some chronic swelling in her legs, and will get a rash once she defervesces from a fever. She got both the flu shot and prevnar 13 this year.   In the ED, she had a CXR which showed bilateral lower lobe pneumonia. She was initiated on therapy for CAP including rocephin and azithromycin. She also received solumedrol.    HOSPITAL COURSE: *  Bilateral pneumonia - Seen on CXR - Also with elevated WBC (up to 12.7 from 7.2 in March of this year), fever and tachycardia - She has not been hospitalized recently, no recent steroids - Patient started on treatment for CAP, Rocephin and azithromycin , now switched to [facility] and azithromycin for another 7 days - Pleuritic chest pain to be treated with tylenol, oxycodone as needed - BC X 2 sent, pending Hycodan for cough -  Flu panel negative  - Tylenol for fever - She does have sinus tachycardia, but no acute risk factors for PE - wells score is 1.5 due to heart rate, low risk.  - Currently saturating well on room air. Patient states that she has been ambulating, currently on room air, like to go home, hemodynamically stable    Asthma, severe persistent and ABPA (allergice bronchopulmonary aspergillosis) - She has no wheezing on exam, reports none at home and no worsening of her chronic breathing issues (SOB X 3 months per her, not worse today) - Though she has the latter 2 above, she has no peripheral eosinophilia today, no obstruction apparent obstruction or mucus plugging - She received a dose of solumedrol in the ED already, consider starting steroids in the AM if she begins wheezing - Duonebs 4 times daily and PRN - Continue home asmanex  Continue home regimen  Depression/Anxiety - She is on effexor and xanax 0.67m BID prn - these will be continued  Esophageal reflux - She is on nexium and zantac -  continued (changed to protonix per formulary and zantac)   Environmental allergies - Continue allegra, singulair   Chest pain, atypical - Likely pleuritic given her report, no substernal chest pain, worse with deep breathing and reproducible. Very atypical for ACS. Monitor closely - Pain control with tylenol and oxycodone as needed   Discharge Exam:    Blood pressure 122/63, pulse 77, temperature 97.7 F (36.5 C), temperature source Oral, resp. rate 16, height 5' 2" (1.575 m), weight 76.658 kg (169 lb), SpO2 100 %.  Constitutional: Appears well-developed and well-nourished. No distress. Sitting on side of bed HENT: Normocephalic. Eyes: Conjunctivae are normal. no scleral icterus.  Neck: Normal ROM. Neck supple. No tracheal deviation. No thyromegaly.  CVS: RR, NR, S1/S2 +, no murmurs noted Pulmonary: Effort and breath sounds normal, no stridor, mild rhonchi in left lower base, cleared  with deep breathing, no wheezing  Abdominal: Soft. BS +, no tenderness  Musculoskeletal: No edema and no tenderness. Thin limbs Lymphadenopathy: No lymphadenopathy noted, cervical. Neuro: Alert. No cranial nerve deficit. Skin: Skin is warm and dry. No rash noted. Not diaphoretic. No erythema. No pallor.  Psychiatric: Normal mood and affect. Behavior, judgment, thought content normal.       Discharge Instructions    Diet - low sodium heart healthy    Complete by:  As directed      Increase activity slowly    Complete by:  As directed            Follow-up Information    Follow up with [name], MD. Schedule an appointment as soon as possible for a visit in 3 days.   Specialty:  Family Medicine   Contact information:   [place] [id number] [phone]       Signed: Reyne Dumas 11/09/2014, 11:26 AM        Time spent >45 mins

## 2014-11-09 NOTE — ED Notes (Addendum)
Pt arrives via EMS from home, states she was seen at [facility] and admitted to [facility] for same. Discharged today at 1600. DXed bilateral pneumonia. LS clear L sided, diminished R side. R sided rib pain. Albuterol and atrovent neb tx in progress. Anxious in triage.

## 2014-11-09 NOTE — ED Notes (Signed)
X-Ray @ bedside.

## 2014-11-10 LAB — APTT: APTT: 35 s (ref 24–37)

## 2014-11-10 LAB — COMPREHENSIVE METABOLIC PANEL
ALBUMIN: 2.8 g/dL — AB (ref 3.5–5.0)
ALT: 66 U/L — ABNORMAL HIGH (ref 14–54)
AST: 45 U/L — ABNORMAL HIGH (ref 15–41)
Alkaline Phosphatase: 167 U/L — ABNORMAL HIGH (ref 38–126)
Anion gap: 6 (ref 5–15)
BILIRUBIN TOTAL: 0.3 mg/dL (ref 0.3–1.2)
BUN: 10 mg/dL (ref 6–20)
CHLORIDE: 107 mmol/L (ref 101–111)
CO2: 25 mmol/L (ref 22–32)
Calcium: 8 mg/dL — ABNORMAL LOW (ref 8.9–10.3)
Creatinine, Ser: 0.57 mg/dL (ref 0.44–1.00)
GLUCOSE: 167 mg/dL — AB (ref 65–99)
POTASSIUM: 3.9 mmol/L (ref 3.5–5.1)
Sodium: 138 mmol/L (ref 135–145)
Total Protein: 5.9 g/dL — ABNORMAL LOW (ref 6.5–8.1)

## 2014-11-10 LAB — CBC
HEMATOCRIT: 32.8 % — AB (ref 36.0–46.0)
Hemoglobin: 10.5 g/dL — ABNORMAL LOW (ref 12.0–15.0)
MCH: 28.4 pg (ref 26.0–34.0)
MCHC: 32 g/dL (ref 30.0–36.0)
MCV: 88.6 fL (ref 78.0–100.0)
Platelets: 329 10*3/uL (ref 150–400)
RBC: 3.7 MIL/uL — ABNORMAL LOW (ref 3.87–5.11)
RDW: 15.3 % (ref 11.5–15.5)
WBC: 11.1 10*3/uL — AB (ref 4.0–10.5)

## 2014-11-10 LAB — PROCALCITONIN: Procalcitonin: <0.1 ng/mL

## 2014-11-10 LAB — PROTIME-INR
INR: 1.13 (ref 0.00–1.49)
PROTHROMBIN TIME: 14.7 s (ref 11.6–15.2)

## 2014-11-10 LAB — LACTIC ACID, PLASMA
LACTIC ACID, VENOUS: 1.9 mmol/L (ref 0.5–2.0)
Lactic Acid, Venous: 1.4 mmol/L (ref 0.5–2.0)

## 2014-11-10 LAB — MRSA PCR SCREENING: MRSA by PCR: POSITIVE — AB

## 2014-11-10 LAB — TROPONIN I

## 2014-11-10 MED ORDER — MUPIROCIN 2 % EX OINT
1.0000 "application " | TOPICAL_OINTMENT | Freq: Two times a day (BID) | CUTANEOUS | Status: DC
  Administered 2014-11-10 – 2014-11-11 (×3): 1 via NASAL
  Filled 2014-11-10: qty 22

## 2014-11-10 MED ORDER — OXYCODONE-ACETAMINOPHEN 5-325 MG PO TABS
1.0000 | ORAL_TABLET | Freq: Four times a day (QID) | ORAL | Status: DC | PRN
  Administered 2014-11-10 (×2): 2 via ORAL
  Administered 2014-11-11: 1 via ORAL
  Filled 2014-11-10: qty 1
  Filled 2014-11-10: qty 2
  Filled 2014-11-10: qty 1
  Filled 2014-11-10: qty 2

## 2014-11-10 MED ORDER — PIPERACILLIN-TAZOBACTAM 3.375 G IVPB 30 MIN: 3.3750 g | Freq: Three times a day (TID) | INTRAVENOUS | Status: DC

## 2014-11-10 MED ORDER — VANCOMYCIN HCL IN DEXTROSE 1-5 GM/200ML-% IV SOLN
1000.0000 mg | Freq: Two times a day (BID) | INTRAVENOUS | Status: DC
  Filled 2014-11-10 (×2): qty 200

## 2014-11-10 MED ORDER — FLUTICASONE PROPIONATE 50 MCG/ACT NA SUSP: 2.0000 | Freq: Every day | NASAL | Status: DC | PRN

## 2014-11-10 MED ORDER — METHYLPREDNISOLONE SODIUM SUCC 125 MG IJ SOLR
60.0000 mg | Freq: Three times a day (TID) | INTRAMUSCULAR | Status: DC
  Administered 2014-11-10: 60 mg via INTRAVENOUS
  Filled 2014-11-10: qty 2
  Filled 2014-11-10 (×3): qty 0.96

## 2014-11-10 MED ORDER — OXYCODONE-ACETAMINOPHEN 5-325 MG PO TABS
2.0000 | ORAL_TABLET | Freq: Four times a day (QID) | ORAL | Status: DC | PRN
  Administered 2014-11-10: 2 via ORAL
  Filled 2014-11-10: qty 2

## 2014-11-10 MED ORDER — MORPHINE SULFATE 2 MG/ML IJ SOLN: 2.0000 mg | INTRAMUSCULAR | Status: DC | PRN

## 2014-11-10 MED ORDER — KETOROLAC TROMETHAMINE 30 MG/ML IJ SOLN: 30.0000 mg | Freq: Once | INTRAMUSCULAR | Status: DC

## 2014-11-10 MED ORDER — CHLORHEXIDINE GLUCONATE CLOTH 2 % EX PADS
6.0000 | MEDICATED_PAD | Freq: Every day | CUTANEOUS | Status: DC
  Administered 2014-11-11: 6 via TOPICAL

## 2014-11-10 MED ORDER — FAMOTIDINE 40 MG PO TABS
40.0000 mg | ORAL_TABLET | Freq: Every day | ORAL | Status: DC
  Administered 2014-11-10 – 2014-11-11 (×2): 40 mg via ORAL
  Filled 2014-11-10 (×2): qty 1

## 2014-11-10 MED ORDER — PIPERACILLIN-TAZOBACTAM 3.375 G IVPB
3.3750 g | Freq: Three times a day (TID) | INTRAVENOUS | Status: DC
  Administered 2014-11-10: 3.375 g via INTRAVENOUS
  Filled 2014-11-10 (×4): qty 50

## 2014-11-10 MED ORDER — ASPIRIN 81 MG PO CHEW
81.0000 mg | CHEWABLE_TABLET | Freq: Every day | ORAL | Status: DC
  Administered 2014-11-10 – 2014-11-11 (×2): 81 mg via ORAL
  Filled 2014-11-10 (×2): qty 1

## 2014-11-10 MED ORDER — IPRATROPIUM-ALBUTEROL 0.5-2.5 (3) MG/3ML IN SOLN
3.0000 mL | RESPIRATORY_TRACT | Status: DC
  Administered 2014-11-10 (×4): 3 mL via RESPIRATORY_TRACT
  Filled 2014-11-10 (×4): qty 3

## 2014-11-10 MED ORDER — DEXTROSE 5 % IV SOLN
500.0000 mg | INTRAVENOUS | Status: DC
  Administered 2014-11-10: 500 mg via INTRAVENOUS
  Filled 2014-11-10 (×2): qty 500

## 2014-11-10 MED ORDER — IBUPROFEN 200 MG PO TABS: 200.0000 mg | ORAL_TABLET | Freq: Four times a day (QID) | ORAL | Status: DC | PRN

## 2014-11-10 MED ORDER — ONDANSETRON HCL 4 MG/2ML IJ SOLN: 4.0000 mg | Freq: Three times a day (TID) | INTRAMUSCULAR | Status: DC | PRN

## 2014-11-10 MED ORDER — CEFTRIAXONE SODIUM IN DEXTROSE 20 MG/ML IV SOLN
1.0000 g | INTRAVENOUS | Status: DC
  Administered 2014-11-10 – 2014-11-11 (×2): 1 g via INTRAVENOUS
  Filled 2014-11-10 (×2): qty 50

## 2014-11-10 MED ORDER — VENLAFAXINE HCL ER 150 MG PO CP24
150.0000 mg | ORAL_CAPSULE | Freq: Every day | ORAL | Status: DC
  Administered 2014-11-10 – 2014-11-11 (×2): 150 mg via ORAL
  Filled 2014-11-10 (×3): qty 1

## 2014-11-10 MED ORDER — SODIUM CHLORIDE 0.9 % IV SOLN
INTRAVENOUS | Status: DC
  Administered 2014-11-10 (×2): via INTRAVENOUS

## 2014-11-10 MED ORDER — DM-GUAIFENESIN ER 30-600 MG PO TB12
1.0000 | ORAL_TABLET | Freq: Two times a day (BID) | ORAL | Status: DC
  Administered 2014-11-10 – 2014-11-11 (×4): 1 via ORAL
  Filled 2014-11-10 (×5): qty 1

## 2014-11-10 MED ORDER — LORATADINE 10 MG PO TABS
10.0000 mg | ORAL_TABLET | Freq: Every day | ORAL | Status: DC
  Administered 2014-11-10 – 2014-11-11 (×2): 10 mg via ORAL
  Filled 2014-11-10 (×2): qty 1

## 2014-11-10 MED ORDER — SODIUM CHLORIDE 0.9 % IJ SOLN
3.0000 mL | Freq: Two times a day (BID) | INTRAMUSCULAR | Status: DC
  Administered 2014-11-10 – 2014-11-11 (×3): 3 mL via INTRAVENOUS

## 2014-11-10 MED ORDER — ACETAMINOPHEN 650 MG RE SUPP: 650.0000 mg | Freq: Four times a day (QID) | RECTAL | Status: DC | PRN

## 2014-11-10 MED ORDER — ALPRAZOLAM 0.5 MG PO TABS
0.5000 mg | ORAL_TABLET | Freq: Two times a day (BID) | ORAL | Status: DC | PRN
  Administered 2014-11-10: 0.5 mg via ORAL
  Filled 2014-11-10: qty 1

## 2014-11-10 MED ORDER — IPRATROPIUM-ALBUTEROL 0.5-2.5 (3) MG/3ML IN SOLN
3.0000 mL | Freq: Three times a day (TID) | RESPIRATORY_TRACT | Status: DC
  Administered 2014-11-10 – 2014-11-11 (×3): 3 mL via RESPIRATORY_TRACT
  Filled 2014-11-10 (×3): qty 3

## 2014-11-10 MED ORDER — HEPARIN SODIUM (PORCINE) 5000 UNIT/ML IJ SOLN
5000.0000 [IU] | Freq: Three times a day (TID) | INTRAMUSCULAR | Status: DC
  Administered 2014-11-10 (×3): 5000 [IU] via SUBCUTANEOUS
  Filled 2014-11-10 (×7): qty 1

## 2014-11-10 MED ORDER — MONTELUKAST SODIUM 10 MG PO TABS
10.0000 mg | ORAL_TABLET | Freq: Every day | ORAL | Status: DC
  Administered 2014-11-10 (×2): 10 mg via ORAL
  Filled 2014-11-10 (×3): qty 1

## 2014-11-10 MED ORDER — SODIUM CHLORIDE 0.9 % IV BOLUS (SEPSIS)
1500.0000 mL | Freq: Once | INTRAVENOUS | Status: AC
  Administered 2014-11-10: 1500 mL via INTRAVENOUS

## 2014-11-10 MED ORDER — HYDROCODONE-HOMATROPINE 5-1.5 MG/5ML PO SYRP: 5.0000 mL | ORAL_SOLUTION | Freq: Four times a day (QID) | ORAL | Status: DC | PRN

## 2014-11-10 MED ORDER — VANCOMYCIN HCL IN DEXTROSE 750-5 MG/150ML-% IV SOLN: 750.0000 mg | INTRAVENOUS | Status: DC

## 2014-11-10 MED ORDER — ALBUTEROL SULFATE (2.5 MG/3ML) 0.083% IN NEBU: 5.0000 mg | INHALATION_SOLUTION | RESPIRATORY_TRACT | Status: AC | PRN

## 2014-11-10 MED ORDER — ALUM & MAG HYDROXIDE-SIMETH 200-200-20 MG/5ML PO SUSP: 30.0000 mL | Freq: Four times a day (QID) | ORAL | Status: DC | PRN

## 2014-11-10 MED ORDER — DIPHENHYDRAMINE HCL 25 MG PO CAPS: 25.0000 mg | ORAL_CAPSULE | Freq: Every evening | ORAL | Status: DC | PRN

## 2014-11-10 MED ORDER — ACETAMINOPHEN 325 MG PO TABS: 650.0000 mg | ORAL_TABLET | Freq: Four times a day (QID) | ORAL | Status: DC | PRN

## 2014-11-10 MED ORDER — VITAMIN D 1000 UNITS PO TABS
2000.0000 [IU] | ORAL_TABLET | Freq: Every day | ORAL | Status: DC
  Administered 2014-11-10 – 2014-11-11 (×2): 2000 [IU] via ORAL
  Filled 2014-11-10 (×2): qty 2

## 2014-11-10 MED ORDER — MELOXICAM 15 MG PO TABS
15.0000 mg | ORAL_TABLET | Freq: Every day | ORAL | Status: DC
  Administered 2014-11-10 – 2014-11-11 (×2): 15 mg via ORAL
  Filled 2014-11-10 (×2): qty 1

## 2014-11-10 NOTE — Progress Notes (Signed)
[facility] PROGRESS NOTE  Victoria [name] BTD:[id number] DOB: [birth year] DOA: 11/09/2014 PCP: [name], MD  Assessment/Plan:  Principal Problem:   SOB (shortness of breath) Active Problems:   Obesity   Depression   Esophageal reflux   Osteoarthritis   OSA (obstructive sleep apnea)   Anxiety state   Asthma, severe persistent   CAP (community acquired pneumonia)   Pleuritic chest pain  Change back to rocephin and azithromycin. D/c steroids.  NSAIDs for pleurisy.  Transfer to floor.  sats fine. Increase activity.   HPI/Subjective: Feels tired. CP and dyspnea improved  Objective: Filed Vitals:   11/10/14 0822  BP: 119/63  Pulse: 99  Temp:   Resp: 23    Intake/Output Summary (Last 24 hours) at 11/10/14 0937 Last data filed at 11/10/14 0400  Gross per 24 hour  Intake 2573.33 ml  Output    100 ml  Net 2473.33 ml   Filed Weights   11/09/14 2102 11/10/14 0003  Weight: 77.111 kg (170 lb) 79.8 kg (175 lb 14.8 oz)    Exam:   General:  Weak appearing. A and o  Cardiovascular: RRR  Respiratory: CTA without WRR  Abdomen: S, NT, ND  Ext: no CCE  Basic Metabolic Panel:  Recent Labs Lab 11/08/14 1430 11/08/14 1930 11/09/14 0637 11/09/14 2100 11/10/14 0302  NA 135  --  136 140 138  K 3.8  --  3.6 3.9 3.9  CL 103  --  106 105 107  CO2 24  --  22 25 25   GLUCOSE 120*  --  135* 96 167*  BUN 8  --  7 14 10   CREATININE 0.56 0.63 0.46 0.73 0.57  CALCIUM 8.8*  --  8.4* 8.9 8.0*   Liver Function Tests:  Recent Labs Lab 11/10/14 0302  AST 45*  ALT 66*  ALKPHOS 167*  BILITOT 0.3  PROT 5.9*  ALBUMIN 2.8*   No results for input(s): LIPASE, AMYLASE in the last 168 hours. No results for input(s): AMMONIA in the last 168 hours. CBC:  Recent Labs Lab 11/08/14 1430 11/08/14 1930 11/09/14 0637 11/09/14 2100 11/10/14 0302  WBC 12.7* 9.5 8.7 13.7* 11.1*  NEUTROABS 10.3*  --   --   --   --   HGB 12.0 11.2* 11.2* 11.0* 10.5*  HCT 36.4 34.8*  34.5* 33.8* 32.8*  MCV 88.6 87.9 87.8 88.0 88.6  PLT 305 307 309 378 329   Cardiac Enzymes:  Recent Labs Lab 11/09/14 2100 11/10/14 0302  TROPONINI 0.09* <0.03   BNP (last 3 results) No results for input(s): BNP in the last 8760 hours.  ProBNP (last 3 results) No results for input(s): PROBNP in the last 8760 hours.  CBG: No results for input(s): GLUCAP in the last 168 hours.  Recent Results (from the past 240 hour(s))  Urine culture     Status: None   Collection Time: 11/08/14  1:11 PM  Result Value Ref Range Status   Specimen Description URINE, CLEAN CATCH  Final   Special Requests NONE  Final   Colony Count   Final    >=100,000 COLONIES/ML Performed at [facility]    Culture   Final    Multiple bacterial morphotypes present, none predominant. Suggest appropriate recollection if clinically indicated. Performed at [facility]    Report Status 11/09/2014 FINAL  Final  Blood culture (routine x 2)     Status: None (Preliminary result)   Collection Time: 11/08/14  2:30 PM  Result Value Ref Range  Status   Specimen Description BLOOD LEFT ARM  Final   Special Requests BOTTLES DRAWN AEROBIC AND ANAEROBIC 5CC EACH  Final   Culture   Final           BLOOD CULTURE RECEIVED NO GROWTH TO DATE CULTURE WILL BE HELD FOR 5 DAYS BEFORE ISSUING A FINAL NEGATIVE REPORT Performed at [facility]    Report Status PENDING  Incomplete  Blood culture (routine x 2)     Status: None (Preliminary result)   Collection Time: 11/08/14  3:25 PM  Result Value Ref Range Status   Specimen Description BLOOD RIGHT ARM  Final   Special Requests BOTTLES DRAWN AEROBIC AND ANAEROBIC 5CC EACH  Final   Culture   Final           BLOOD CULTURE RECEIVED NO GROWTH TO DATE CULTURE WILL BE HELD FOR 5 DAYS BEFORE ISSUING A FINAL NEGATIVE REPORT Performed at [facility]    Report Status PENDING  Incomplete  MRSA PCR Screening     Status: Abnormal   Collection Time: 11/10/14  12:00 AM  Result Value Ref Range Status   MRSA by PCR POSITIVE (A) NEGATIVE Final    Comment:        The GeneXpert MRSA Assay (FDA approved for NASAL specimens only), is one component of a comprehensive MRSA colonization surveillance program. It is not intended to diagnose MRSA infection nor to guide or monitor treatment for MRSA infections. RESULT CALLED TO, READ BACK BY AND VERIFIED WITH: [facility] RN [id number] 11/10/14 MITCHELL,L      Studies: Dg Chest 2 View  11/08/2014   CLINICAL DATA:  Three day history of shortness of breath and right-sided chest pain. Current history of asthma.  EXAM: CHEST  2 VIEW  COMPARISON:  05/30/2014 and earlier.  FINDINGS: Cardiomediastinal silhouette unremarkable, unchanged. Dense airspace consolidation in the right lower lobe with silhouetting of the right hemidiaphragm. Patchy airspace opacities in the left lower lobe. Stable linear scarring in the right upper lobe. No pleural effusions. Degenerative changes involving the thoracic spine with slight thoracic scoliosis convex right.  IMPRESSION: Bilateral lower lobe pneumonia, right greater than left.   Electronically Signed   By: Evangeline Dakin M.D.   On: 11/08/2014 13:34   Ct Angio Chest Pe W/cm &/or Wo Cm  11/09/2014   CLINICAL DATA:  Recent diagnosis of bilateral pneumonia, now with right-sided rib pain. Evaluate for pulmonary embolism.  EXAM: CT ANGIOGRAPHY CHEST WITH CONTRAST  TECHNIQUE: Multidetector CT imaging of the chest was performed using the standard protocol during bolus administration of intravenous contrast. Multiplanar CT image reconstructions and MIPs were obtained to evaluate the vascular anatomy.  CONTRAST:  63mL OMNIPAQUE IOHEXOL 350 MG/ML SOLN  COMPARISON:  11/09/2014; 11/08/2014; chest CT - 05/22/2014  FINDINGS: Vascular Findings:  There is adequate opacification of the pulmonary arterial system with the main pulmonary artery measuring 278 Hounsfield units. There no discrete filling defects  within the pulmonary arterial tree to suggest pulmonary embolism. Normal caliber the main pulmonary artery.  Borderline cardiomegaly.  No pericardial effusion.  Normal caliber of the thoracic aorta. No definite thoracic aortic dissection on this nongated examination. Bovine configuration of the aortic arch. The branch vessels of the aortic arch appear widely patent throughout their imaged course.  Review of the MIP images confirms the above findings.   ----------------------------------------------------------------------------------  Nonvascular Findings:  Evaluation the pulmonary parenchyma is degraded secondary to patient respiratory artifact. Ill-defined heterogeneous and consolidative airspace opacities within in the  anterior and medial basilar segments of the right lower lobe (representative image 66, series 400, compatible with the findings on recently obtained chest radiograph. There is minimal subsegmental atelectasis within the left costophrenic angle. There is grossly unchanged biapical pleural parenchymal thickening.  Note is again made of a mesenteric fat containing Bochdalek as well as a moderate-sized hiatal hernia.  The approximately 6 mm nodule within the right costophrenic angle (68, series 407) is grossly unchanged since the 11/2011 examination. Evaluation for additional discrete pulmonary nodules is degraded secondary to presumed infectious process within the right lower lobe. Scattered shotty mediastinal lymph nodes are individually not enlarged by size criteria with index precarinal lymph node measuring 0.8 cm in greatest short axis diameter (image 35, series 401). No definitive mediastinal, hilar or axillary lymphadenopathy.  Early arterial phase evaluation of the upper abdomen demonstrates an ill-defined hypo attenuating lesion within the right lobe of the liver (image 110, series 401) which is incompletely imaged, likely too small to adequately characterize may represent hepatic cysts.  No  acute or aggressive osseous abnormalities. Regional soft tissues appear normal. Normal appearance of the thyroid gland.  IMPRESSION: 1. No evidence of pulmonary embolism. 2. Findings again worrisome for right lower lobe pneumonia. A follow-up chest radiograph in 3-4 weeks after treatment is recommended to ensure resolution. 3. Punctate approximately 6 mm nodule within the right lower lobe is grossly unchanged since the 11/2011 examination and thus of benign etiology - evaluation for additional discrete pulmonary nodules is degraded secondary to concomitant superimposed acute infectious process within the right lower lobe. 4. Unchanged biapical pleural parenchymal thickening. 5. Unchanged moderate-sized Bochdalek and hiatal hernias.   Electronically Signed   By: Sandi Mariscal M.D.   On: 11/09/2014 23:32   Dg Chest Port 1 View  11/09/2014   CLINICAL DATA:  Right-sided chest pain and shortness of breath  EXAM: PORTABLE CHEST - 1 VIEW  COMPARISON:  Chest x-ray from yesterday  FINDINGS: Bilateral lower lobe airspace disease, progressed in density and extent on the left. Normal heart size. Stable upper mediastinal contours, with prominence and convexity of the right mediastinum accentuated by rotation and portable technique. Nnown sliding hiatal hernia and fatty left Bochdalek's hernia are partially visible through the heart. No effusion, edema, or pneumothorax.  IMPRESSION: Bibasilar pneumonia, progressed on the left since yesterday.   Electronically Signed   By: Monte Fantasia M.D.   On: 11/09/2014 21:38    Scheduled Meds: . aspirin  81 mg Oral Daily  . Chlorhexidine Gluconate Cloth  6 each Topical Q0600  . cholecalciferol  2,000 Units Oral Daily  . dextromethorphan-guaiFENesin  1 tablet Oral BID  . famotidine  40 mg Oral Daily  . heparin  5,000 Units Subcutaneous 3 times per day  . ipratropium-albuterol  3 mL Inhalation Q4H  . loratadine  10 mg Oral Daily  . methylPREDNISolone (SOLU-MEDROL) injection  60  mg Intravenous Q8H  . montelukast  10 mg Oral QHS  . mupirocin ointment  1 application Nasal BID  . piperacillin-tazobactam (ZOSYN)  IV  3.375 g Intravenous Q8H  . sodium chloride  3 mL Intravenous Q12H  . vancomycin  1,000 mg Intravenous Q12H  . venlafaxine XR  150 mg Oral Q breakfast   Continuous Infusions: . sodium chloride 100 mL/hr at 11/10/14 0727    Time spent: 35 minutes  [facility] Pager [phone]. If 7PM-7AM, please contact night-coverage at [email or web address], password [facility] 11/10/2014, 9:37 AM  LOS: 1 day

## 2014-11-10 NOTE — Progress Notes (Signed)
Medicare Important Message given? YES (If response is "NO", the following Medicare IM given date fields will be blank) Date Medicare IM given: 11/10/14  Medicare IM given by: Cem Kosman 

## 2014-11-10 NOTE — Progress Notes (Signed)
ANTIBIOTIC CONSULT NOTE - INITIAL  Pharmacy Consult for vancomycin Indication: pneumonia  Allergies  Allergen Reactions  . Beclomethasone Dipropionate Hives and Other (See Comments)     weight gain  . Budesonide-Formoterol Fumarate Hives  . Sulfonamide Derivatives Hives and Rash  . Mometasone Furo-Formoterol Fum Hives and Other (See Comments)    weight gain  . Statins     Myalgias, RLS    Patient Measurements: Height: 5\' 2"  (157.5 cm) Weight: 170 lb (77.111 kg) IBW/kg (Calculated) : 50.1  Vital Signs: Temp: 99.4 F (37.4 C) (05/23 0003) Temp Source: Oral (05/23 0003) BP: 120/89 mmHg (05/22 2245) Pulse Rate: 100 (05/22 2245)  Labs:  Recent Labs  11/08/14 1930 11/09/14 0637 11/09/14 2100  WBC 9.5 8.7 13.7*  HGB 11.2* 11.2* 11.0*  PLT 307 309 378  CREATININE 0.63 0.46 0.73   Estimated Creatinine Clearance: 74.6 mL/min (by C-G formula based on Cr of 0.73).   Microbiology: Recent Results (from the past 720 hour(s))  Urine culture     Status: None   Collection Time: 11/08/14  1:11 PM  Result Value Ref Range Status   Specimen Description URINE, CLEAN CATCH  Final   Special Requests NONE  Final   Colony Count   Final    >=100,000 COLONIES/ML Performed at [facility]    Culture   Final    Multiple bacterial morphotypes present, none predominant. Suggest appropriate recollection if clinically indicated. Performed at [facility]    Report Status 11/09/2014 FINAL  Final  Blood culture (routine x 2)     Status: None (Preliminary result)   Collection Time: 11/08/14  2:30 PM  Result Value Ref Range Status   Specimen Description BLOOD LEFT ARM  Final   Special Requests BOTTLES DRAWN AEROBIC AND ANAEROBIC 5CC EACH  Final   Culture   Final           BLOOD CULTURE RECEIVED NO GROWTH TO DATE CULTURE WILL BE HELD FOR 5 DAYS BEFORE ISSUING A FINAL NEGATIVE REPORT Performed at [facility]    Report Status PENDING  Incomplete  Blood culture  (routine x 2)     Status: None (Preliminary result)   Collection Time: 11/08/14  3:25 PM  Result Value Ref Range Status   Specimen Description BLOOD RIGHT ARM  Final   Special Requests BOTTLES DRAWN AEROBIC AND ANAEROBIC 5CC EACH  Final   Culture   Final           BLOOD CULTURE RECEIVED NO GROWTH TO DATE CULTURE WILL BE HELD FOR 5 DAYS BEFORE ISSUING A FINAL NEGATIVE REPORT Performed at [facility]    Report Status PENDING  Incomplete    Medical History: Past Medical History  Diagnosis Date  . Asthma   . Obesity   . Maxillary sinusitis   . Osteoarthritis   . IBS (irritable bowel syndrome)   . Hyperlipidemia   . COPD (chronic obstructive pulmonary disease)   . Pulmonary nodules   . Osteoporosis   . Nasal congestion 9/12    current cold  . Cough   . Wheezing   . GERD (gastroesophageal reflux disease)   . H/O hiatal hernia   . Depression     mild  . Allergic bronchopulmonary aspergillosis 2008    sees Dr Edmund Hilda pulmonology  . Anemia     iron deficiency, resolved  . Normal cardiac stress test 11/2011  . OSA (obstructive sleep apnea) 02/2012    has stopped using  cpap  . Anginal pain   . Chronic bronchitis   . Exertional dyspnea     "sometimes lying down; always w/exertion" (04/13/2012)  . Headache(784.0)     "often but not daily" (04/13/2012)  . Anxiety   . Pneumonia 11/2011    "before 2013 I hadn't had pneumonia since I was a child" (04/13/2012)  . Schatzki's ring   . Pain in joint, ankle and foot 08/10/2014  . Palpitations 08/19/2008    Qualifier: Diagnosis of  By: Jerold Coombe      Medications:  Prescriptions prior to admission  Medication Sig Dispense Refill Last Dose  . ALPRAZolam (XANAX) 0.5 MG tablet Take 1 tablet (0.5 mg total) by mouth 2 (two) times daily as needed for anxiety (Palpatations, SOB). 30 tablet 1 Past Week at Unknown time  . aspirin 81 MG tablet Take 81 mg by mouth daily.    11/08/2014 at Unknown time  . Cholecalciferol  (VITAMIN D) 2000 UNITS CAPS Take 2,000 Units by mouth daily.    11/08/2014 at Unknown time  . diphenhydrAMINE (SOMINEX) 25 MG tablet Take 25 mg by mouth at bedtime as needed for itching.   11/06/2014  . fexofenadine (ALLEGRA) 180 MG tablet Take 180 mg by mouth as needed for allergies.    Past Week at Unknown time  . ibuprofen (ADVIL,MOTRIN) 200 MG tablet Take 200 mg by mouth every 6 (six) hours as needed for mild pain or moderate pain.   Past Week at Unknown time  . ipratropium-albuterol (DUONEB) 0.5-2.5 (3) MG/3ML SOLN Inhale 3 mLs into the lungs 4 (four) times daily as needed. (Patient taking differently: Inhale 3 mLs into the lungs 4 (four) times daily as needed (wheezing or shortness of breath). ) 360 mL 5 2 weeks ago  . mometasone (ASMANEX 60 METERED DOSES) 220 MCG/INH inhaler USE 2 INHALATIONS ORALLY EVERY EVENING 3 Inhaler 1 11/07/2014  . montelukast (SINGULAIR) 10 MG tablet TAKE 1 TABLET BY MOUTH TWICE DAILY (Patient taking differently: Take 10 mg by mouth. TAKE 1 TABLET BY MOUTH once or TWICE DAILY) 180 tablet 3 11/08/2014 at Unknown time  . ranitidine (ZANTAC) 300 MG tablet Take 1 tablet (300 mg total) by mouth at bedtime. 90 tablet 1 11/08/2014 at Unknown time  . tiotropium (SPIRIVA HANDIHALER) 18 MCG inhalation capsule INHALE CONTENTS OF 1 CAPSULE THROUGH HANDIHALER DEVICE EVERY DAY 90 capsule 3 11/08/2014 at Unknown time  . triamcinolone (NASACORT AQ) 55 MCG/ACT AERO nasal inhaler Place 2 sprays into the nose daily as needed (for allergies).    Past Month at Unknown time  . venlafaxine XR (EFFEXOR-XR) 150 MG 24 hr capsule Take 150 mg by mouth daily with breakfast.   11/08/2014 at Unknown time  . azithromycin (ZITHROMAX) 500 MG tablet Take 1 tablet (500 mg total) by mouth daily. 7 tablet 0   . cefdinir (OMNICEF) 300 MG capsule Take 2 capsules (600 mg total) by mouth 2 (two) times daily. 14 capsule 0   . [START ON 11/17/2014] furosemide (LASIX) 20 MG tablet Take 1 tablet (20 mg total) by mouth daily  as needed. 30 tablet 1   . HYDROcodone-homatropine (HYCODAN) 5-1.5 MG/5ML syrup Take 5 mLs by mouth every 6 (six) hours as needed for cough. 240 mL 0    Scheduled:  . aspirin  81 mg Oral Daily  . cholecalciferol  2,000 Units Oral Daily  . dextromethorphan-guaiFENesin  1 tablet Oral BID  . famotidine  40 mg Oral Daily  . heparin  5,000 Units Subcutaneous  3 times per day  . ipratropium-albuterol  3 mL Inhalation Q4H  . loratadine  10 mg Oral Daily  . methylPREDNISolone (SOLU-MEDROL) injection  60 mg Intravenous Q8H  . montelukast  10 mg Oral QHS  . piperacillin-tazobactam (ZOSYN)  IV  3.375 g Intravenous Q8H  . sodium chloride  1,500 mL Intravenous Once  . sodium chloride  3 mL Intravenous Q12H  . venlafaxine XR  150 mg Oral Q breakfast   Infusions:  . sodium chloride      Assessment: 59yo female was discharged after brief admission for PNA, was on Rocephin and azithro, changed to [place] and azithro PO for outpt tx, returned to ED ~4hr after discharge c/o rib pain, D-dimer was elevated but CT negative for PE, CT still worrisome for PNA, to broaden IV ABX.  Goal of Therapy:  Vancomycin trough level 15-20 mcg/ml  Plan:  Rec'd vanc 1g in ED; will continue with vancomycin 1000mg  IV Q12H and monitor CBC, Cx, levels prn.  Wynona Neat, PharmD, BCPS  11/10/2014,12:16 AM

## 2014-11-10 NOTE — Progress Notes (Signed)
OT Cancellation Note  Patient Details Name: Victoria [name] MRN: [id number] DOB: [birth year]   Cancelled Treatment:    Reason Eval/Treat Not Completed: Medical issues which prohibited therapy;Fatigue/lethargy limiting ability to participate - Pt lethargic.  She reports she received pain meds and is now unable to maintain adequate arousal.  Will reattempt.   Darlina Rumpf [place], OTR/L [phone]    11/10/2014, 3:25 PM

## 2014-11-10 NOTE — Progress Notes (Signed)
Patient refused CPAP at this time.  Stated that she had problems sleeping  previously while attempting to wear CPAP at home.

## 2014-11-10 NOTE — Progress Notes (Signed)
RT Note: pt has refused cpap at this time.  Rt will continue to monitor.

## 2014-11-10 NOTE — Progress Notes (Signed)
Pt tx [facility] [place] per MD order, pt tol well, pt VSS, pt verbalized understanding of tx, report called to receiving RN, all questions answered

## 2014-11-10 NOTE — Evaluation (Signed)
Physical Therapy Evaluation Patient Details Name: Victoria [name] MRN: [id number] DOB: December 24, 1955 Today's Date: 11/10/2014   History of Present Illness  pt presents with CAP and Sepsis.    Clinical Impression  Pt generally deconditioned and labored with all mobility.  Feel pt will improve to be able to return to home, but will need increased A with caring for her son with CP.  Pt's husband works and pt is primary CG for her son.  Pt states she will try to get increased aide services for son this week.  Will continue to follow while on acute.      Follow Up Recommendations No PT follow up;Supervision - Intermittent    Equipment Recommendations  None recommended by PT    Recommendations for Other Services       Precautions / Restrictions Precautions Precautions: None Restrictions Weight Bearing Restrictions: No      Mobility  Bed Mobility               General bed mobility comments: pt sitting EOB.    Transfers Overall transfer level: Needs assistance Equipment used: None Transfers: Sit to/from Stand Sit to Stand: Supervision         General transfer comment: pt moves slowly and seems very labored by movement, but no physical A required.    Ambulation/Gait Ambulation/Gait assistance: Supervision Ambulation Distance (Feet): 150 Feet Assistive device: None Gait Pattern/deviations: Step-through pattern;Decreased stride length     General Gait Details: pt moves slowly and labored, but no LOB.  pt indicates R sided rib pain from mobility and increased RR.    Stairs            Wheelchair Mobility    Modified Rankin (Stroke Patients Only)       Balance Overall balance assessment: No apparent balance deficits (not formally assessed)                                           Pertinent Vitals/Pain Pain Assessment: 0-10 Pain Score: 4  Pain Location: R side/ribs. Pain Descriptors / Indicators: Sore;Stabbing Pain Intervention(s):  Monitored during session;Premedicated before session;Repositioned    Home Living Family/patient expects to be discharged to:: Private residence Living Arrangements: Spouse/significant other;Children (Son has CP and is total care.  ) Available Help at Discharge: Family;Available PRN/intermittently Type of Home: House Home Access: Ramped entrance     Home Layout: One level Home Equipment: None Additional Comments: pt's husband works and pt is primary CG for son with CP.  Son has aides 75hrs per week.      Prior Function Level of Independence: Independent               Hand Dominance        Extremity/Trunk Assessment   Upper Extremity Assessment: Defer to OT evaluation           Lower Extremity Assessment: Generalized weakness      Cervical / Trunk Assessment: Normal  Communication   Communication: No difficulties  Cognition Arousal/Alertness: Awake/alert Behavior During Therapy: WFL for tasks assessed/performed Overall Cognitive Status: Within Functional Limits for tasks assessed                      General Comments      Exercises        Assessment/Plan    PT Assessment Patient needs continued PT services  PT  Diagnosis Difficulty walking   PT Problem List Decreased strength;Decreased activity tolerance;Decreased mobility;Cardiopulmonary status limiting activity  PT Treatment Interventions Gait training;Functional mobility training;Therapeutic activities;Therapeutic exercise;Balance training;Patient/family education   PT Goals (Current goals can be found in the Care Plan section) Acute Rehab PT Goals Patient Stated Goal: Back to normal.   PT Goal Formulation: With patient Time For Goal Achievement: 11/17/14 Potential to Achieve Goals: Good    Frequency Min 3X/week   Barriers to discharge Decreased caregiver support Husband works and pt is primary CG for son with CP.      Co-evaluation               End of Session   Activity  Tolerance: Patient limited by fatigue Patient left: in chair;with call bell/phone within reach Nurse Communication: Mobility status         Time: 6979-4801 PT Time Calculation (min) (ACUTE ONLY): 25 min   Charges:   PT Evaluation $Initial PT Evaluation Tier I: 1 Procedure PT Treatments $Gait Training: 8-22 mins   PT G CodesCatarina [name], [name] 11/10/2014, 11:51 AM

## 2014-11-11 ENCOUNTER — Telehealth: Payer: Self-pay | Admitting: Internal Medicine

## 2014-11-11 DIAGNOSIS — J189 Pneumonia, unspecified organism: Secondary | ICD-10-CM

## 2014-11-11 DIAGNOSIS — R0602 Shortness of breath: Secondary | ICD-10-CM

## 2014-11-11 DIAGNOSIS — R0781 Pleurodynia: Secondary | ICD-10-CM

## 2014-11-11 LAB — CBC
HEMATOCRIT: 30 % — AB (ref 36.0–46.0)
Hemoglobin: 9.7 g/dL — ABNORMAL LOW (ref 12.0–15.0)
MCH: 28.7 pg (ref 26.0–34.0)
MCHC: 32.3 g/dL (ref 30.0–36.0)
MCV: 88.8 fL (ref 78.0–100.0)
Platelets: 320 10*3/uL (ref 150–400)
RBC: 3.38 MIL/uL — ABNORMAL LOW (ref 3.87–5.11)
RDW: 15.4 % (ref 11.5–15.5)
WBC: 8.9 10*3/uL (ref 4.0–10.5)

## 2014-11-11 LAB — BASIC METABOLIC PANEL
Anion gap: 8 (ref 5–15)
BUN: 13 mg/dL (ref 6–20)
CHLORIDE: 107 mmol/L (ref 101–111)
CO2: 26 mmol/L (ref 22–32)
CREATININE: 0.54 mg/dL (ref 0.44–1.00)
Calcium: 8.6 mg/dL — ABNORMAL LOW (ref 8.9–10.3)
GFR calc Af Amer: >60 mL/min (ref >60)
GFR calc non Af Amer: >60 mL/min (ref >60)
Glucose, Bld: 88 mg/dL (ref 65–99)
Potassium: 3.8 mmol/L (ref 3.5–5.1)
SODIUM: 141 mmol/L (ref 135–145)

## 2014-11-11 MED ORDER — AZITHROMYCIN 500 MG PO TABS
500.0000 mg | ORAL_TABLET | Freq: Every day | ORAL | Status: DC
  Administered 2014-11-11: 500 mg via ORAL
  Filled 2014-11-11: qty 1

## 2014-11-11 MED ORDER — OXYCODONE-ACETAMINOPHEN 5-325 MG PO TABS: 1.0000 | ORAL_TABLET | Freq: Three times a day (TID) | ORAL | Status: DC | PRN

## 2014-11-11 NOTE — Progress Notes (Signed)
OT Cancellation Note  Patient Details Name: Aiyah Scarpelli MRN: [id number] DOB: 06/11/1956   Cancelled Treatment:    Reason Eval/Treat Not Completed: OT screened, no needs identified, will sign off.  Pt prepared for discharge.  Reports having dressed herself and routinely taking herself to the bathroom.  PT also confirming pt's independence.  Did not proceed with evaluation.  Malka So 11/11/2014, 2:48 PM  [phone]

## 2014-11-11 NOTE — Progress Notes (Signed)
Physical Therapy Treatment and Discharge Patient Details Name: Victoria [name] MRN: [id number] DOB: 26-Mar-1956 Today's Date: 11/11/2014    History of Present Illness pt presents with CAP and Sepsis.      PT Comments    Much improved activity tolerance and amb; Safe to walk hallways independently (educated her on hand hygeine before leaving room); OK for dc home from PT standpoint  Goals met, education complete, will sign off   Follow Up Recommendations  No PT follow up;Supervision - Intermittent     Equipment Recommendations  None recommended by PT    Recommendations for Other Services       Precautions / Restrictions Precautions Precautions: None    Mobility  Bed Mobility                  Transfers   Equipment used: None Transfers: Sit to/from Stand Sit to Stand: Independent         General transfer comment: reports no difficulty getting up  Ambulation/Gait Ambulation/Gait assistance: Independent Ambulation Distance (Feet): 300 Feet (greater than) Assistive device: None Gait Pattern/deviations: WFL(Within Functional Limits)   Gait velocity interpretation: at or above normal speed for age/gender General Gait Details: Managing quite well with amb; much less pleuritic pain, having taken 1 percocet earlier; Cues to self-monitor for activity tolerance   Stairs            Wheelchair Mobility    Modified Rankin (Stroke Patients Only)       Balance Overall balance assessment: No apparent balance deficits (not formally assessed)                                  Cognition Arousal/Alertness: Awake/alert Behavior During Therapy: WFL for tasks assessed/performed Overall Cognitive Status: Within Functional Limits for tasks assessed                      Exercises      General Comments General comments (skin integrity, edema, etc.): session conducted on Room Air, and O2 sats remained greater than or equal to 90%; no gross  DOE noted as well, pt able to walk and talk      Pertinent Vitals/Pain Pain Assessment: No/denies pain Pain Intervention(s): Premedicated before session    Home Living                      Prior Function            PT Goals (current goals can now be found in the care plan section) Acute Rehab PT Goals Patient Stated Goal: Back to normal.   Progress towards PT goals: Goals met/education completed, patient discharged from PT    Frequency       PT Plan Current plan remains appropriate    Co-evaluation             End of Session Equipment Utilized During Treatment: Other (comment) (pulse oximeter) Activity Tolerance: Patient tolerated treatment well Patient left: Other (comment) (managing independently in room)     Time: 0865-7846 PT Time Calculation (min) (ACUTE ONLY): 15 min  Charges:  $Gait Training: 8-22 mins                    G Codes:      [name] 11/11/2014, 11:36 AM  [name], [place] Pager [phone] Office [phone]

## 2014-11-11 NOTE — Consult Note (Signed)
Name: Victoria [name]: [id number] DOB: [birth year]    ADMISSION DATE:  11/09/2014 CONSULTATION DATE:  11/11/2014  REFERRING MD :  Conley Canal  CHIEF COMPLAINT:  SOB, chest pain  BRIEF PATIENT DESCRIPTION: 59 y.o. F with Asthma, ABPA, BTX (followed by MR), admitted 5/21 with CAP and discharged 5/22.  Back to ED 5/23 with chest pain.  On 5/24 upset that she had not been seen by pulmonary team so called office and requested to be seen.  SIGNIFICANT EVENTS  5/21 - 5/22 > admitted with CAP 5/23 > back to ED with chest pain 5/24 > requested pulmonary consult  STUDIES:  CXR 5/21 >>> b/l PNA, R > L. CXR 5/22 >>> bibasilar PNA, progressed on left since prior image. CTA Chest 5/22 >>> no PE, RLL PNA. Small RLL nodule is unchanged since 2013. Unchanged bochdalek and hiatal hernias.  MICRO: BCx 5/23 >>>   Sputum 5/23 >>>  HISTORY OF PRESENT ILLNESS:  Victoria [name] is a 59 y.o. F with PMH as outlined below including Asthma, ABPA, Bronchiectasis (followed by Dr. Chase Caller - last seen in 2015).  She was initially admitted 11/08/14 - 11/09/14 and treated for CAP (Rx with azithro / ceftriaxone and discharged on Omnicef).  She returned less than 24 hours later c/o right lateral and inferior chest pain (she did also have chest pain during her initial admission).  Pain described as worse with deep breaths, persistent, non-radiating, associated with SOB.  She did report subjective fevers and chills but denied any cough / sputum production. Initial troponin was negative.  D-dimer was up but CTA negative for PE.  CXR showed worsening bilateral PNA so she was admitted for further management.  Her abx were broadened to vanc / zosyn initially but then de-escalated the following day back to azithromycin and ceftriaxone given her clinical improvement.  Pt was apparently upset that nobody from pulmonary service had seen her during her admission so she called the [place] office and requested that she be seen by pulmonary this  admission.  She has not had any sputum production with cough.  No hx of recent sputum or any hemoptysis.  She does not report any wheezing.  She denies any rhinitis, PND, skin changes.  She is maintained on duonebs, mometasone, singulair, ranitidine, spiriva, nasacort.   PAST MEDICAL HISTORY :   has a past medical history of Asthma; Obesity; Maxillary sinusitis; Osteoarthritis; IBS (irritable bowel syndrome); Hyperlipidemia; COPD (chronic obstructive pulmonary disease); Pulmonary nodules; Osteoporosis; Nasal congestion (9/12); Cough; Wheezing; GERD (gastroesophageal reflux disease); H/O hiatal hernia; Depression; Allergic bronchopulmonary aspergillosis (2008); Anemia; Normal cardiac stress test (11/2011); OSA (obstructive sleep apnea) (02/2012); Anginal pain; Chronic bronchitis; Exertional dyspnea; Headache(784.0); Anxiety; Pneumonia (11/2011); Schatzki's ring; Pain in joint, ankle and foot (08/10/2014); and Palpitations (08/19/2008).  has past surgical history that includes Cesarean section (1985); Hernia repair (04/13/2012); Appendectomy (1989); and Ventral hernia repair (04/13/2012). Prior to Admission medications   Medication Sig Start Date End Date Taking? Authorizing Provider  ALPRAZolam Duanne Moron) 0.5 MG tablet Take 1 tablet (0.5 mg total) by mouth 2 (two) times daily as needed for anxiety (Palpatations, SOB). 07/29/14  Yes Mosie Lukes, MD  aspirin 81 MG tablet Take 81 mg by mouth daily.    Yes Historical Provider, MD  Cholecalciferol (VITAMIN D) 2000 UNITS CAPS Take 2,000 Units by mouth daily.    Yes Historical Provider, MD  diphenhydrAMINE (SOMINEX) 25 MG tablet Take 25 mg by mouth at bedtime as needed for itching.   Yes Historical Provider, MD  fexofenadine (ALLEGRA) 180 MG tablet Take 180 mg by mouth as needed for allergies.    Yes Historical Provider, MD  ibuprofen (ADVIL,MOTRIN) 200 MG tablet Take 200 mg by mouth every 6 (six) hours as needed for mild pain or moderate pain.   Yes Historical  Provider, MD  ipratropium-albuterol (DUONEB) 0.5-2.5 (3) MG/3ML SOLN Inhale 3 mLs into the lungs 4 (four) times daily as needed. Patient taking differently: Inhale 3 mLs into the lungs 4 (four) times daily as needed (wheezing or shortness of breath).  05/19/14  Yes Brand Males, MD  mometasone (ASMANEX 60 METERED DOSES) 220 MCG/INH inhaler USE 2 INHALATIONS ORALLY EVERY EVENING 11/06/13  Yes Brand Males, MD  montelukast (SINGULAIR) 10 MG tablet TAKE 1 TABLET BY MOUTH TWICE DAILY Patient taking differently: Take 10 mg by mouth. TAKE 1 TABLET BY MOUTH once or TWICE DAILY 11/06/13  Yes Brand Males, MD  ranitidine (ZANTAC) 300 MG tablet Take 1 tablet (300 mg total) by mouth at bedtime. 04/23/14  Yes Mosie Lukes, MD  tiotropium (SPIRIVA HANDIHALER) 18 MCG inhalation capsule INHALE CONTENTS OF 1 CAPSULE THROUGH HANDIHALER DEVICE EVERY DAY 11/06/13  Yes Brand Males, MD  triamcinolone (NASACORT AQ) 55 MCG/ACT AERO nasal inhaler Place 2 sprays into the nose daily as needed (for allergies).    Yes Historical Provider, MD  venlafaxine XR (EFFEXOR-XR) 150 MG 24 hr capsule Take 150 mg by mouth daily with breakfast.   Yes Historical Provider, MD  azithromycin (ZITHROMAX) 500 MG tablet Take 1 tablet (500 mg total) by mouth daily. 11/09/14   Reyne Dumas, MD  cefdinir (OMNICEF) 300 MG capsule Take 2 capsules (600 mg total) by mouth 2 (two) times daily. 11/09/14   Reyne Dumas, MD  furosemide (LASIX) 20 MG tablet Take 1 tablet (20 mg total) by mouth daily as needed. 11/17/14   Reyne Dumas, MD  HYDROcodone-homatropine (HYCODAN) 5-1.5 MG/5ML syrup Take 5 mLs by mouth every 6 (six) hours as needed for cough. 11/09/14   Reyne Dumas, MD   Allergies  Allergen Reactions  . Beclomethasone Dipropionate Hives and Other (See Comments)     weight gain  . Budesonide-Formoterol Fumarate Hives  . Sulfonamide Derivatives Hives and Rash  . Mometasone Furo-Formoterol Fum Hives and Other (See Comments)    weight  gain  . Statins     Myalgias, RLS    FAMILY HISTORY:  family history includes Breast cancer in her mother and sister; Diverticulosis in her father; Prostate cancer in her father and father; Pulmonary embolism in her brother. SOCIAL HISTORY:  reports that she has never smoked. She has never used smokeless tobacco. She reports that she drinks about 2.4 oz of alcohol per week. She reports that she does not use illicit drugs.  REVIEW OF SYSTEMS:   All negative; except for those that are bolded, which indicate positives.  Constitutional: weight loss, weight gain, night sweats, fevers, chills, fatigue, weakness.  HEENT: headaches, sore throat, sneezing, nasal congestion, post nasal drip, difficulty swallowing, tooth/dental problems, visual complaints, visual changes, ear aches. Neuro: difficulty with speech, weakness, numbness, ataxia. CV:  Pleuritic chest pain, orthopnea, PND, swelling in lower extremities, dizziness, palpitations, syncope.  Resp: cough, hemoptysis, dyspnea, wheezing. GI  heartburn, indigestion, abdominal pain, nausea, vomiting, diarrhea, constipation, change in bowel habits, loss of appetite, hematemesis, melena, hematochezia.  GU: dysuria, change in color of urine, urgency or frequency, flank pain, hematuria. MSK: joint pain or swelling, decreased range of motion. Psych: change in mood or affect, depression, anxiety, suicidal ideations,  homicidal ideations. Skin: rash, itching, bruising.   SUBJECTIVE:  Pain improved after percocet.  Is anxious about discharge and hopes she can gradually regain her strength.  VITAL SIGNS: Temp:  [98 F (36.7 C)-98.1 F (36.7 C)] 98.1 F (36.7 C) (05/24 0548) Pulse Rate:  [79-90] 79 (05/24 0548) Resp:  [16-20] 18 (05/24 0548) BP: (116-125)/(58-74) 125/74 mmHg (05/24 0548) SpO2:  [97 %-98 %] 97 % (05/24 0548)  PHYSICAL EXAMINATION: General: WDWN female, resting in recliner, in NAD. Neuro: A&O x 3, non-focal.  HEENT: [place]/AT. PERRL,  sclerae anicteric. Cardiovascular: RRR, no M/R/G.  Tenderness to right lateral rib cage, pain reproducible. Lungs: Respirations even and unlabored.  CTA bilaterally, No W/R/R. Abdomen: BS x 4, soft, NT/ND.  Musculoskeletal: No gross deformities, no edema.  Skin: Intact, warm, no rashes.    Recent Labs Lab 11/09/14 2100 11/10/14 0302 11/11/14 0435  NA 140 138 141  K 3.9 3.9 3.8  CL 105 107 107  CO2 25 25 26   BUN 14 10 13   CREATININE 0.73 0.57 0.54  GLUCOSE 96 167* 88    Recent Labs Lab 11/09/14 2100 11/10/14 0302 11/11/14 0435  HGB 11.0* 10.5* 9.7*  HCT 33.8* 32.8* 30.0*  WBC 13.7* 11.1* 8.9  PLT 378 329 320   Ct Angio Chest Pe W/cm &/or Wo Cm  11/09/2014   CLINICAL DATA:  Recent diagnosis of bilateral pneumonia, now with right-sided rib pain. Evaluate for pulmonary embolism.  EXAM: CT ANGIOGRAPHY CHEST WITH CONTRAST  TECHNIQUE: Multidetector CT imaging of the chest was performed using the standard protocol during bolus administration of intravenous contrast. Multiplanar CT image reconstructions and MIPs were obtained to evaluate the vascular anatomy.  CONTRAST:  3mL OMNIPAQUE IOHEXOL 350 MG/ML SOLN  COMPARISON:  11/09/2014; 11/08/2014; chest CT - 05/22/2014  FINDINGS: Vascular Findings:  There is adequate opacification of the pulmonary arterial system with the main pulmonary artery measuring 278 Hounsfield units. There no discrete filling defects within the pulmonary arterial tree to suggest pulmonary embolism. Normal caliber the main pulmonary artery.  Borderline cardiomegaly.  No pericardial effusion.  Normal caliber of the thoracic aorta. No definite thoracic aortic dissection on this nongated examination. Bovine configuration of the aortic arch. The branch vessels of the aortic arch appear widely patent throughout their imaged course.  Review of the MIP images confirms the above findings.   ----------------------------------------------------------------------------------   Nonvascular Findings:  Evaluation the pulmonary parenchyma is degraded secondary to patient respiratory artifact. Ill-defined heterogeneous and consolidative airspace opacities within in the anterior and medial basilar segments of the right lower lobe (representative image 66, series 400, compatible with the findings on recently obtained chest radiograph. There is minimal subsegmental atelectasis within the left costophrenic angle. There is grossly unchanged biapical pleural parenchymal thickening.  Note is again made of a mesenteric fat containing Bochdalek as well as a moderate-sized hiatal hernia.  The approximately 6 mm nodule within the right costophrenic angle (68, series 407) is grossly unchanged since the 11/2011 examination. Evaluation for additional discrete pulmonary nodules is degraded secondary to presumed infectious process within the right lower lobe. Scattered shotty mediastinal lymph nodes are individually not enlarged by size criteria with index precarinal lymph node measuring 0.8 cm in greatest short axis diameter (image 35, series 401). No definitive mediastinal, hilar or axillary lymphadenopathy.  Early arterial phase evaluation of the upper abdomen demonstrates an ill-defined hypo attenuating lesion within the right lobe of the liver (image 110, series 401) which is incompletely imaged, likely too small to adequately  characterize may represent hepatic cysts.  No acute or aggressive osseous abnormalities. Regional soft tissues appear normal. Normal appearance of the thyroid gland.  IMPRESSION: 1. No evidence of pulmonary embolism. 2. Findings again worrisome for right lower lobe pneumonia. A follow-up chest radiograph in 3-4 weeks after treatment is recommended to ensure resolution. 3. Punctate approximately 6 mm nodule within the right lower lobe is grossly unchanged since the 11/2011 examination and thus of benign etiology - evaluation for additional discrete pulmonary nodules is degraded  secondary to concomitant superimposed acute infectious process within the right lower lobe. 4. Unchanged biapical pleural parenchymal thickening. 5. Unchanged moderate-sized Bochdalek and hiatal hernias.   Electronically Signed   By: Sandi Mariscal M.D.   On: 11/09/2014 23:32   Dg Chest Port 1 View  11/09/2014   CLINICAL DATA:  Right-sided chest pain and shortness of breath  EXAM: PORTABLE CHEST - 1 VIEW  COMPARISON:  Chest x-ray from yesterday  FINDINGS: Bilateral lower lobe airspace disease, progressed in density and extent on the left. Normal heart size. Stable upper mediastinal contours, with prominence and convexity of the right mediastinum accentuated by rotation and portable technique. Nnown sliding hiatal hernia and fatty left Bochdalek's hernia are partially visible through the heart. No effusion, edema, or pneumothorax.  IMPRESSION: Bibasilar pneumonia, progressed on the left since yesterday.   Electronically Signed   By: Monte Fantasia M.D.   On: 11/09/2014 21:38    ASSESSMENT / PLAN:  CAP Pleuritic chest pain / Pleurisy - troponins and EKG remain negative Flu negative Plan: Continue empiric abx for CAP. Would discharge on 10 day course of Levofloxacin. Follow cultures. Pulmonary hygiene. Agree NSAIDs and PRN percocet for pleuritic chest pain. Follow up arranged for Tuesday 11/18/14 at 4:15PM with a repeat CXR.  Asthma - no indication of exacerbation ABPA - CBC on initial admit neg for eos Bronchiectasis - no indication of exacerbation Small RLL pulmonary nodule - unchanged since 2013 Plan: Continue outpatient regimen. No role systemic steroids or antifungals. No further interventions required for pulmonary nodule as it is almost certainly benign.  Nothing further from pulmonary standpoint.  She has follow up arranged in 1 week with repeat CXR. PCCM will sign off.  Please do not hesitate to call us back if we can be of any further assistance.    Montey Hora, [place]  Pulmonary & Critical Care Medicine Pager: [phone]  or [phone] 11/11/2014, 11:56 AM    PCCM ATTENDING: I have reviewed pt's initial presentation, consultants notes and hospital database in detail.  The above assessment and plan was formulated under my direction.  In summary: Clinical history, Xray findings and exam are all consistent with CAP. Her pain suggests a pleuritic component but I do not detect a friction rub. As above, recommend 10 day course of levofloxacin, PRN NSAIDS and follow up with [facility] in a week. She should have a CXR at the time of follow up and should be followed closely until complete clinical and radiographic resolution   Merton Border, MD;  PCCM service; Mobile [phone]

## 2014-11-11 NOTE — Telephone Encounter (Signed)
Spoke with pt, states she is hospitalized at [facility] and is unhappy that a pulmonary doc has not yet rounded on her.  I apologized, and advised that I would let MR know she is currently hospitalized, and advised for her to let her nurse know she is a patient of MR and wishes to see a pulmonary provider.    Pt also wants to make note to MR that she is having SOB but has been told her lung sounds are clear and she is not wheezing.    Forwarding to MR to make aware.

## 2014-11-11 NOTE — Care Management Note (Signed)
Case Management Note  Patient Details  Name: Victoria [name] MRN: [id number] Date of Birth: [birth year]  Subjective/Objective:                 CM following for progression and d/c planning.   Action/Plan: No d/c needs identified at this time, pt is independent and ambulatory in her room without assistance, 11/11/14.   Expected Discharge Date:          11/11/2014        Expected Discharge Plan:  Home/Self Care  In-House Referral:     Discharge planning Services  CM Consult  Post Acute Care Choice:  NA Choice offered to:  NA  DME Arranged:    DME Agency:     HH Arranged:    HH Agency:     Status of Service:     Medicare Important Message Given:  Yes Date Medicare IM Given:  11/11/14 Medicare IM give by:  Jasmine Pang RN MPH, case manager Date Additional Medicare IM Given:    Additional Medicare Important Message give by:     If discussed at [place] of Stay Meetings, dates discussed:    Additional Comments:  Adron Bene, RN 11/11/2014, 11:27 AM

## 2014-11-11 NOTE — Progress Notes (Signed)
PHARMACIST - PHYSICIAN COMMUNICATION DR:   Conley Canal CONCERNING: Antibiotic IV to Oral Route Change Policy  RECOMMENDATION: This patient is receiving Azithromycin by the intravenous route.  Based on criteria approved by the Pharmacy and Therapeutics Committee, the antibiotic(s) is/are being converted to the equivalent oral dose form(s).  DESCRIPTION: These criteria include:  Patient being treated for a respiratory tract infection, urinary tract infection, cellulitis or clostridium difficile associated diarrhea if on metronidazole  The patient is not neutropenic and does not exhibit a GI malabsorption state  The patient is eating (either orally or via tube) and/or has been taking other orally administered medications for a least 24 hours  The patient is improving clinically and has a Tmax < 100.5  If you have questions about this conversion, please contact the Pharmacy Department  -  [phone] )  [facility] -  [phone] )  [facility] -  [phone] )  [facility] -  [phone] )  [facility] -  [phone] )  [place], PharmD, BCPS Clinical Pharmacist Pager: [phone] 11/11/2014 11:25 AM

## 2014-11-11 NOTE — Discharge Summary (Signed)
Physician Discharge Summary  Victoria [name] KYH:[id number] DOB: 09-11-1955 DOA: 11/09/2014  PCP: [name], MD  Admit date: 11/09/2014 Discharge date: 11/11/2014  Time spent: greater than 30 minutes  Discharge Diagnoses:  Principal Problem:   SOB (shortness of breath) Active Problems:   Obesity   Depression   Esophageal reflux   Osteoarthritis   OSA (obstructive sleep apnea)   Anxiety state   Asthma, severe persistent   CAP (community acquired pneumonia)   pleurisy   Discharge Condition: stable  Diet recommendation: general  Filed Weights   11/09/14 2102 11/10/14 0003  Weight: 77.111 kg (170 lb) 79.8 kg (175 lb 14.8 oz)    History of present illness:  59 y.o. female with PMH of hyperlipidemia, GERD, depression, anxiety, OSA, asthma, history of ABPA, recently treated CAP (5/21 to 11/09/14), who presents with worsening chest pain or shortness of breath.  Patient was admitted from 5/21 to 5/22 and treated for comminuted acquired pneumonia with antibiotics. She had negative flu workup. He was initially treated with Rocephin and azithromycin, and discharged on Omnicef and azithromycin for 7 days. He was adjusted discharged today. She reports that after she went home, she developed severe chest pain tonight. The chest pain is located in the right side of chest, severe, sharp, persistent, nonradiating. It is pleuritic in nature. It is associated with shortness of breath, subjective fever and chills. She does not have cough.   Currently patient denies running nose, ear pain, headaches, abdominal pain, diarrhea, constipation, dysuria, urgency, frequency, hematuria, skin rashes or leg swelling. No unilateral weakness, numbness or tingling sensations. No vision change or hearing loss.  In ED, patient was found to have WBC 13.7, tachycardia, temperature normal, no oxygen desaturation, troponin negative, lactate 1.94. Chest x-ray showed worsening bilateral pneumonia. D-dimer positive, but CTA  is negative for PE.  Hospital Course:  Antibiotics continued.   Chest pain pleuritic. PE ruled out. Likely pleurisy.  Improved with NSAIDs.  Lungs CTA and normal oxygen saturations and vital signs.  Patient called Dr. Golden Pop office, requesting pulmonary consult. He called me to request. I consulted PCCM who agreed with management and recommend discharge home. Medically stable.  Ambulating independently off oxygen. Tolerating diet  Procedures:  none  Consultations:  PCCM  Discharge Exam: Filed Vitals:   11/11/14 0945  BP: 126/81  Pulse: 71  Temp: 98.6 F (37 C)  Resp: 18    General: comfortable. A and o Cardiovascular: RRR Respiratory: CTA  Discharge Instructions   Discharge Instructions    Diet - low sodium heart healthy    Complete by:  As directed      Increase activity slowly    Complete by:  As directed           Current Discharge Medication List    START taking these medications   Details  oxyCODONE-acetaminophen (PERCOCET/ROXICET) 5-325 MG per tablet Take 1 tablet by mouth every 8 (eight) hours as needed for severe pain. Qty: 15 tablet, Refills: 0      CONTINUE these medications which have NOT CHANGED   Details  ALPRAZolam (XANAX) 0.5 MG tablet Take 1 tablet (0.5 mg total) by mouth 2 (two) times daily as needed for anxiety (Palpatations, SOB). Qty: 30 tablet, Refills: 1   Associated Diagnoses: Anxiety and depression    aspirin 81 MG tablet Take 81 mg by mouth daily.     Cholecalciferol (VITAMIN D) 2000 UNITS CAPS Take 2,000 Units by mouth daily.     diphenhydrAMINE (SOMINEX) 25 MG tablet  Take 25 mg by mouth at bedtime as needed for itching.    fexofenadine (ALLEGRA) 180 MG tablet Take 180 mg by mouth as needed for allergies.     ibuprofen (ADVIL,MOTRIN) 200 MG tablet Take 200 mg by mouth every 6 (six) hours as needed for mild pain or moderate pain.    ipratropium-albuterol (DUONEB) 0.5-2.5 (3) MG/3ML SOLN Inhale 3 mLs into the lungs 4 (four)  times daily as needed. Qty: 360 mL, Refills: 5    mometasone (ASMANEX 60 METERED DOSES) 220 MCG/INH inhaler USE 2 INHALATIONS ORALLY EVERY EVENING Qty: 3 Inhaler, Refills: 1    montelukast (SINGULAIR) 10 MG tablet TAKE 1 TABLET BY MOUTH TWICE DAILY Qty: 180 tablet, Refills: 3    ranitidine (ZANTAC) 300 MG tablet Take 1 tablet (300 mg total) by mouth at bedtime. Qty: 90 tablet, Refills: 1   Associated Diagnoses: Gastroesophageal reflux disease with esophagitis; Non-intractable vomiting with nausea, vomiting of unspecified type    tiotropium (SPIRIVA HANDIHALER) 18 MCG inhalation capsule INHALE CONTENTS OF 1 CAPSULE THROUGH HANDIHALER DEVICE EVERY DAY Qty: 90 capsule, Refills: 3    triamcinolone (NASACORT AQ) 55 MCG/ACT AERO nasal inhaler Place 2 sprays into the nose daily as needed (for allergies).     venlafaxine XR (EFFEXOR-XR) 150 MG 24 hr capsule Take 150 mg by mouth daily with breakfast.    azithromycin (ZITHROMAX) 500 MG tablet Take 1 tablet (500 mg total) by mouth daily. Qty: 7 tablet, Refills: 0    cefdinir (OMNICEF) 300 MG capsule Take 2 capsules (600 mg total) by mouth 2 (two) times daily. Qty: 14 capsule, Refills: 0    furosemide (LASIX) 20 MG tablet Take 1 tablet (20 mg total) by mouth daily as needed. Qty: 30 tablet, Refills: 1    HYDROcodone-homatropine (HYCODAN) 5-1.5 MG/5ML syrup Take 5 mLs by mouth every 6 (six) hours as needed for cough. Qty: 240 mL, Refills: 0       Allergies  Allergen Reactions  . Beclomethasone Dipropionate Hives and Other (See Comments)     weight gain  . Budesonide-Formoterol Fumarate Hives  . Sulfonamide Derivatives Hives and Rash  . Mometasone Furo-Formoterol Fum Hives and Other (See Comments)    weight gain  . Statins     Myalgias, RLS   Follow-up Information    Follow up with PARRETT,TAMMY, NP On 11/18/2014.   Specialty:  Nurse Practitioner   Why:  Your appointment is at 4:15 PM.   Contact information:   [address] [place] [address] [phone]        The results of significant diagnostics from this hospitalization (including imaging, microbiology, ancillary and laboratory) are listed below for reference.    Significant Diagnostic Studies: Dg Chest 2 View  11/08/2014   CLINICAL DATA:  Three day history of shortness of breath and right-sided chest pain. Current history of asthma.  EXAM: CHEST  2 VIEW  COMPARISON:  05/30/2014 and earlier.  FINDINGS: Cardiomediastinal silhouette unremarkable, unchanged. Dense airspace consolidation in the right lower lobe with silhouetting of the right hemidiaphragm. Patchy airspace opacities in the left lower lobe. Stable linear scarring in the right upper lobe. No pleural effusions. Degenerative changes involving the thoracic spine with slight thoracic scoliosis convex right.  IMPRESSION: Bilateral lower lobe pneumonia, right greater than left.   Electronically Signed   By: Evangeline Dakin M.D.   On: 11/08/2014 13:34   Ct Angio Chest Pe W/cm &/or Wo Cm  11/09/2014   CLINICAL DATA:  Recent diagnosis of  bilateral pneumonia, now with right-sided rib pain. Evaluate for pulmonary embolism.  EXAM: CT ANGIOGRAPHY CHEST WITH CONTRAST  TECHNIQUE: Multidetector CT imaging of the chest was performed using the standard protocol during bolus administration of intravenous contrast. Multiplanar CT image reconstructions and MIPs were obtained to evaluate the vascular anatomy.  CONTRAST:  20mL OMNIPAQUE IOHEXOL 350 MG/ML SOLN  COMPARISON:  11/09/2014; 11/08/2014; chest CT - 05/22/2014  FINDINGS: Vascular Findings:  There is adequate opacification of the pulmonary arterial system with the main pulmonary artery measuring 278 Hounsfield units. There no discrete filling defects within the pulmonary arterial tree to suggest pulmonary embolism. Normal caliber the main pulmonary artery.  Borderline cardiomegaly.  No pericardial effusion.  Normal caliber of the thoracic aorta. No definite  thoracic aortic dissection on this nongated examination. Bovine configuration of the aortic arch. The branch vessels of the aortic arch appear widely patent throughout their imaged course.  Review of the MIP images confirms the above findings.   ----------------------------------------------------------------------------------  Nonvascular Findings:  Evaluation the pulmonary parenchyma is degraded secondary to patient respiratory artifact. Ill-defined heterogeneous and consolidative airspace opacities within in the anterior and medial basilar segments of the right lower lobe (representative image 66, series 400, compatible with the findings on recently obtained chest radiograph. There is minimal subsegmental atelectasis within the left costophrenic angle. There is grossly unchanged biapical pleural parenchymal thickening.  Note is again made of a mesenteric fat containing Bochdalek as well as a moderate-sized hiatal hernia.  The approximately 6 mm nodule within the right costophrenic angle (68, series 407) is grossly unchanged since the 11/2011 examination. Evaluation for additional discrete pulmonary nodules is degraded secondary to presumed infectious process within the right lower lobe. Scattered shotty mediastinal lymph nodes are individually not enlarged by size criteria with index precarinal lymph node measuring 0.8 cm in greatest short axis diameter (image 35, series 401). No definitive mediastinal, hilar or axillary lymphadenopathy.  Early arterial phase evaluation of the upper abdomen demonstrates an ill-defined hypo attenuating lesion within the right lobe of the liver (image 110, series 401) which is incompletely imaged, likely too small to adequately characterize may represent hepatic cysts.  No acute or aggressive osseous abnormalities. Regional soft tissues appear normal. Normal appearance of the thyroid gland.  IMPRESSION: 1. No evidence of pulmonary embolism. 2. Findings again worrisome for right  lower lobe pneumonia. A follow-up chest radiograph in 3-4 weeks after treatment is recommended to ensure resolution. 3. Punctate approximately 6 mm nodule within the right lower lobe is grossly unchanged since the 11/2011 examination and thus of benign etiology - evaluation for additional discrete pulmonary nodules is degraded secondary to concomitant superimposed acute infectious process within the right lower lobe. 4. Unchanged biapical pleural parenchymal thickening. 5. Unchanged moderate-sized Bochdalek and hiatal hernias.   Electronically Signed   By: Sandi Mariscal M.D.   On: 11/09/2014 23:32   Dg Chest Port 1 View  11/09/2014   CLINICAL DATA:  Right-sided chest pain and shortness of breath  EXAM: PORTABLE CHEST - 1 VIEW  COMPARISON:  Chest x-ray from yesterday  FINDINGS: Bilateral lower lobe airspace disease, progressed in density and extent on the left. Normal heart size. Stable upper mediastinal contours, with prominence and convexity of the right mediastinum accentuated by rotation and portable technique. Nnown sliding hiatal hernia and fatty left Bochdalek's hernia are partially visible through the heart. No effusion, edema, or pneumothorax.  IMPRESSION: Bibasilar pneumonia, progressed on the left since yesterday.   Electronically Signed   By: Angelica Chessman  Watts M.D.   On: 11/09/2014 21:38    Microbiology: Recent Results (from the past 240 hour(s))  Urine culture     Status: None   Collection Time: 11/08/14  1:11 PM  Result Value Ref Range Status   Specimen Description URINE, CLEAN CATCH  Final   Special Requests NONE  Final   Colony Count   Final    >=100,000 COLONIES/ML Performed at [facility]    Culture   Final    Multiple bacterial morphotypes present, none predominant. Suggest appropriate recollection if clinically indicated. Performed at [facility]    Report Status 11/09/2014 FINAL  Final  Blood culture (routine x 2)     Status: None (Preliminary result)    Collection Time: 11/08/14  2:30 PM  Result Value Ref Range Status   Specimen Description BLOOD LEFT ARM  Final   Special Requests BOTTLES DRAWN AEROBIC AND ANAEROBIC 5CC EACH  Final   Culture   Final           BLOOD CULTURE RECEIVED NO GROWTH TO DATE CULTURE WILL BE HELD FOR 5 DAYS BEFORE ISSUING A FINAL NEGATIVE REPORT Performed at [facility]    Report Status PENDING  Incomplete  Blood culture (routine x 2)     Status: None (Preliminary result)   Collection Time: 11/08/14  3:25 PM  Result Value Ref Range Status   Specimen Description BLOOD RIGHT ARM  Final   Special Requests BOTTLES DRAWN AEROBIC AND ANAEROBIC 5CC EACH  Final   Culture   Final           BLOOD CULTURE RECEIVED NO GROWTH TO DATE CULTURE WILL BE HELD FOR 5 DAYS BEFORE ISSUING A FINAL NEGATIVE REPORT Performed at [facility]    Report Status PENDING  Incomplete  MRSA PCR Screening     Status: Abnormal   Collection Time: 11/10/14 12:00 AM  Result Value Ref Range Status   MRSA by PCR POSITIVE (A) NEGATIVE Final    Comment:        The GeneXpert MRSA Assay (FDA approved for NASAL specimens only), is one component of a comprehensive MRSA colonization surveillance program. It is not intended to diagnose MRSA infection nor to guide or monitor treatment for MRSA infections. RESULT CALLED TO, READ BACK BY AND VERIFIED WITH: DECAREAUX,T RN [id number] 11/10/14 MITCHELL,L   Culture, blood (x 2)     Status: None (Preliminary result)   Collection Time: 11/10/14 12:46 AM  Result Value Ref Range Status   Specimen Description BLOOD RIGHT ANTECUBITAL  Final   Special Requests BOTTLES DRAWN AEROBIC AND ANAEROBIC 5CC EA  Final   Culture   Final           BLOOD CULTURE RECEIVED NO GROWTH TO DATE CULTURE WILL BE HELD FOR 5 DAYS BEFORE ISSUING A FINAL NEGATIVE REPORT Performed at [facility]    Report Status PENDING  Incomplete  Culture, blood (x 2)     Status: None (Preliminary result)   Collection Time:  11/10/14 12:56 AM  Result Value Ref Range Status   Specimen Description BLOOD RIGHT ANTECUBITAL  Final   Special Requests BOTTLES DRAWN AEROBIC AND ANAEROBIC 5CC EA  Final   Culture   Final           BLOOD CULTURE RECEIVED NO GROWTH TO DATE CULTURE WILL BE HELD FOR 5 DAYS BEFORE ISSUING A FINAL NEGATIVE REPORT Performed at [facility]    Report Status PENDING  Incomplete  Labs: Basic Metabolic Panel:  Recent Labs Lab 11/08/14 1430 11/08/14 1930 11/09/14 0637 11/09/14 2100 11/10/14 0302 11/11/14 0435  NA 135  --  136 140 138 141  K 3.8  --  3.6 3.9 3.9 3.8  CL 103  --  106 105 107 107  CO2 24  --  22 25 25 26   GLUCOSE 120*  --  135* 96 167* 88  BUN 8  --  7 14 10 13   CREATININE 0.56 0.63 0.46 0.73 0.57 0.54  CALCIUM 8.8*  --  8.4* 8.9 8.0* 8.6*   Liver Function Tests:  Recent Labs Lab 11/10/14 0302  AST 45*  ALT 66*  ALKPHOS 167*  BILITOT 0.3  PROT 5.9*  ALBUMIN 2.8*   No results for input(s): LIPASE, AMYLASE in the last 168 hours. No results for input(s): AMMONIA in the last 168 hours. CBC:  Recent Labs Lab 11/08/14 1430 11/08/14 1930 11/09/14 0637 11/09/14 2100 11/10/14 0302 11/11/14 0435  WBC 12.7* 9.5 8.7 13.7* 11.1* 8.9  NEUTROABS 10.3*  --   --   --   --   --   HGB 12.0 11.2* 11.2* 11.0* 10.5* 9.7*  HCT 36.4 34.8* 34.5* 33.8* 32.8* 30.0*  MCV 88.6 87.9 87.8 88.0 88.6 88.8  PLT 305 307 309 378 329 320   Cardiac Enzymes:  Recent Labs Lab 11/09/14 2100 11/10/14 0302  TROPONINI 0.09* <0.03   BNP: BNP (last 3 results) No results for input(s): BNP in the last 8760 hours.  ProBNP (last 3 results) No results for input(s): PROBNP in the last 8760 hours.  CBG: No results for input(s): GLUCAP in the last 168 hours.     SignedDelfina Redwood  [facility] 11/11/2014, 2:21 PM

## 2014-11-12 ENCOUNTER — Encounter: Payer: Self-pay | Admitting: Internal Medicine

## 2014-11-12 ENCOUNTER — Telehealth: Payer: Self-pay | Admitting: Internal Medicine

## 2014-11-12 NOTE — Telephone Encounter (Signed)
Spoke yesterday to Dr Conley Canal and she will call pulm consult

## 2014-11-12 NOTE — Telephone Encounter (Signed)
Spoke with the pt  She states her "pleuritic pain is spreading to my sternum"  Hurts when she bends over  She states had fever last night  OV with TP for acute ov tomorrow at 9:30

## 2014-11-12 NOTE — Progress Notes (Signed)
NURSING PROGRESS NOTE  Aleksandra Raben [id number] Discharge Data: 11/12/2014 5:33 PM Attending Provider: No att. providers found GUY:[id number], Erline Levine, MD     Victoria [name] to be D/C'd Home per MD order.  Discussed with the patient the After Visit Summary and all questions fully answered. All IV's discontinued with no bleeding noted. All belongings returned to patient for patient to take home.   Last Vital Signs:  Blood pressure 126/81, pulse 71, temperature 98.6 F (37 C), temperature source Oral, resp. rate 18, height 5\' 2"  (1.575 m), weight 79.8 kg (175 lb 14.8 oz), SpO2 98 %.  Discharge Medication List   Medication List    TAKE these medications        ALPRAZolam 0.5 MG tablet  Commonly known as:  XANAX  Take 1 tablet (0.5 mg total) by mouth 2 (two) times daily as needed for anxiety (Palpatations, SOB).     aspirin 81 MG tablet  Take 81 mg by mouth daily.     azithromycin 500 MG tablet  Commonly known as:  ZITHROMAX  Take 1 tablet (500 mg total) by mouth daily.     cefdinir 300 MG capsule  Commonly known as:  OMNICEF  Take 2 capsules (600 mg total) by mouth 2 (two) times daily.     diphenhydrAMINE 25 MG tablet  Commonly known as:  SOMINEX  Take 25 mg by mouth at bedtime as needed for itching.     fexofenadine 180 MG tablet  Commonly known as:  ALLEGRA  Take 180 mg by mouth as needed for allergies.     furosemide 20 MG tablet  Commonly known as:  LASIX  Take 1 tablet (20 mg total) by mouth daily as needed.  Start taking on:  11/17/2014     HYDROcodone-homatropine 5-1.5 MG/5ML syrup  Commonly known as:  HYCODAN  Take 5 mLs by mouth every 6 (six) hours as needed for cough.     ibuprofen 200 MG tablet  Commonly known as:  ADVIL,MOTRIN  Take 200 mg by mouth every 6 (six) hours as needed for mild pain or moderate pain.     ipratropium-albuterol 0.5-2.5 (3) MG/3ML Soln  Commonly known as:  DUONEB  Inhale 3 mLs into the lungs 4 (four) times daily as needed.     mometasone  220 MCG/INH inhaler  Commonly known as:  ASMANEX 60 METERED DOSES  USE 2 INHALATIONS ORALLY EVERY EVENING     montelukast 10 MG tablet  Commonly known as:  SINGULAIR  TAKE 1 TABLET BY MOUTH TWICE DAILY     NASACORT AQ 55 MCG/ACT Aero nasal inhaler  Generic drug:  triamcinolone  Place 2 sprays into the nose daily as needed (for allergies).     oxyCODONE-acetaminophen 5-325 MG per tablet  Commonly known as:  PERCOCET/ROXICET  Take 1 tablet by mouth every 8 (eight) hours as needed for severe pain.     ranitidine 300 MG tablet  Commonly known as:  ZANTAC  Take 1 tablet (300 mg total) by mouth at bedtime.     tiotropium 18 MCG inhalation capsule  Commonly known as:  SPIRIVA HANDIHALER  INHALE CONTENTS OF 1 CAPSULE THROUGH HANDIHALER DEVICE EVERY DAY     venlafaxine XR 150 MG 24 hr capsule  Commonly known as:  EFFEXOR-XR  Take 150 mg by mouth daily with breakfast.     Vitamin D 2000 UNITS Caps  Take 2,000 Units by mouth daily.

## 2014-11-13 ENCOUNTER — Encounter: Payer: Self-pay | Admitting: Adult Health

## 2014-11-13 ENCOUNTER — Ambulatory Visit
Admission: RE | Admit: 2014-11-13 | Discharge: 2014-11-13 | Disposition: A | Discharge status: Home / Self Care | Payer: Medicare Other | Source: Ambulatory Visit | Attending: Adult Health | Admitting: Adult Health

## 2014-11-13 ENCOUNTER — Ambulatory Visit (HOSPITAL_BASED_OUTPATIENT_CLINIC_OR_DEPARTMENT_OTHER)
Admission: RE | Admit: 2014-11-13 | Discharge: 2014-11-13 | Disposition: A | Discharge status: Home / Self Care | Payer: Managed Care, Other (non HMO) | Source: Ambulatory Visit | Attending: Adult Health | Admitting: Adult Health

## 2014-11-13 ENCOUNTER — Ambulatory Visit: Payer: Managed Care, Other (non HMO) | Admitting: Family Medicine

## 2014-11-13 ENCOUNTER — Ambulatory Visit (INDEPENDENT_AMBULATORY_CARE_PROVIDER_SITE_OTHER): Payer: Managed Care, Other (non HMO) | Admitting: Adult Health

## 2014-11-13 VITALS — BP 120/76 | HR 89 | Temp 98.4°F | Ht 62.0 in | Wt 174.2 lb

## 2014-11-13 DIAGNOSIS — I517 Cardiomegaly: Secondary | ICD-10-CM | POA: Diagnosis not present

## 2014-11-13 DIAGNOSIS — J189 Pneumonia, unspecified organism: Secondary | ICD-10-CM

## 2014-11-13 DIAGNOSIS — J4551 Severe persistent asthma with (acute) exacerbation: Secondary | ICD-10-CM | POA: Diagnosis not present

## 2014-11-13 DIAGNOSIS — R0602 Shortness of breath: Secondary | ICD-10-CM | POA: Diagnosis not present

## 2014-11-13 DIAGNOSIS — R079 Chest pain, unspecified: Secondary | ICD-10-CM | POA: Insufficient documentation

## 2014-11-13 NOTE — Assessment & Plan Note (Signed)
Right sided CAP with ongoing pleuritic chest wall pain CT chest was neg for PE , confirming right sided PNA  Will have her finish abx .  tx pleuritic pain. -use narcotic cautiously   Plan  Finish Zithromax and Cefnidir .  Mucinex DM Twice daily  As needed  Cough/congestion  Prednisone taper over next week.  Warm heat to ribs.  Fluids and rest .  Chest xray today  Please contact office for sooner follow up if symptoms do not improve or worsen or seek emergency care  Follow up in 2-3 Dr. Chase Caller with chest xray and As needed

## 2014-11-13 NOTE — Progress Notes (Signed)
   Subjective:    Patient ID: Victoria [name], female    DOB: [birth year], 59 y.o.   MRN: [id number]  HPI 59  y.o. female with mild OSA, severe persistent asthma and ABPA   11/13/2014 Post hospital follow up  Pt returns for post hospital follow up .  Admitted x 2 recently for CAP. Found to have bilateral on CXR , tx w/ abx and discharged  Went home briefly but developed chest pain /pleuritic pain with worsening dyspnea.  Went back to hospital , CT chest was neg for PE , showed RLL PNA .  Cardiac enzymes were mildly elevated with repeat neg. Lactate and procalcitonin nml.  She was treated with IV abx and discharged on zithromax and cefdinir.  Since discharge she remains very weak and has sign cough and pleuritic pain on right.  No fever, hemoptyisis, calf pain, orthopnea, or exertional chest pain.  Taking percocet for pain, which helps.    Review of Systems Constitutional:   No  weight loss, night sweats,  Fevers, chills +, fatigue, or  lassitude.  HEENT:   No headaches,  Difficulty swallowing,  Tooth/dental problems, or  Sore throat,                No sneezing, itching, ear ache, nasal congestion, post nasal drip,   CV:  No chest pain,  Orthopnea, PND, swelling in lower extremities, anasarca, dizziness, palpitations, syncope.   GI  No heartburn, indigestion, abdominal pain, nausea, vomiting, diarrhea, change in bowel habits, loss of appetite, bloody stools.   Resp:    No chest wall deformity  Skin: no rash or lesions.  GU: no dysuria, change in color of urine, no urgency or frequency.  No flank pain, no hematuria   MS:  No joint pain or swelling.  No decreased range of motion.  No back pain.  Psych:  No change in mood or affect. No depression or anxiety.  No memory loss.         Objective:   Physical Exam GEN: A/Ox3; pleasant , NAD, elderly   HEENT:  [place]/AT,  EACs-clear, TMs-wnl, NOSE-clear, THROAT-clear, no lesions, no postnasal drip or exudate noted.   NECK:  Supple w/  fair ROM; no JVD; normal carotid impulses w/o bruits; no thyromegaly or nodules palpated; no lymphadenopathy.  RESP  Decreased BS  w/o, wheezes/ rales/ or rhonchi.no accessory muscle use, no dullness to percussion  CARD:  RRR, no m/r/g  , no peripheral edema, pulses intact, no cyanosis or clubbing.  GI:   Soft & nt; nml bowel sounds; no organomegaly or masses detected.  Musco: Warm bil, no deformities or joint swelling noted.   Neuro: alert, no focal deficits noted.    Skin: Warm, no lesions or rashes         Assessment & Plan:

## 2014-11-13 NOTE — Assessment & Plan Note (Signed)
Mild flare with ongoing cough with PNA  Plan   Mucinex DM Twice daily  As needed  Cough/congestion  Prednisone taper over next week.  Warm heat to ribs.  Fluids and rest .  Chest xray today  Please contact office for sooner follow up if symptoms do not improve or worsen or seek emergency care  Follow up in 2-3 Dr. Chase Caller with chest xray and As needed

## 2014-11-13 NOTE — Patient Instructions (Signed)
Finish Zithromax and [employer] .  Mucinex DM Twice daily  As needed  Cough/congestion  Prednisone taper over next week.  Warm heat to ribs.  Fluids and rest .  Chest xray today  Please contact office for sooner follow up if symptoms do not improve or worsen or seek emergency care  Follow up in 2-3 Dr. Chase Caller with chest xray and As needed

## 2014-11-14 LAB — CULTURE, BLOOD (ROUTINE X 2)
CULTURE: NO GROWTH
CULTURE: NO GROWTH

## 2014-11-14 MED ORDER — PREDNISONE 10 MG PO TABS: ORAL_TABLET | ORAL | Status: DC

## 2014-11-14 NOTE — Addendum Note (Signed)
Addended by: Parke Poisson E on: 11/14/2014 05:07 PM   Modules accepted: Orders

## 2014-11-14 NOTE — Progress Notes (Signed)
Quick Note:  Called spoke with patient, advised of cxr results / recs as stated by TP. Pt verbalized her understanding and denied any questions. ______ 

## 2014-11-15 ENCOUNTER — Encounter: Payer: Self-pay | Admitting: Adult Health

## 2014-11-16 LAB — CULTURE, BLOOD (ROUTINE X 2)
Culture: NO GROWTH
Culture: NO GROWTH

## 2014-11-17 ENCOUNTER — Emergency Department (HOSPITAL_COMMUNITY)
Admission: EM | Admit: 2014-11-17 | Discharge: 2014-11-17 | Disposition: A | Discharge status: Home / Self Care | Payer: Managed Care, Other (non HMO) | Attending: Emergency Medicine | Admitting: Emergency Medicine

## 2014-11-17 ENCOUNTER — Encounter (HOSPITAL_COMMUNITY): Payer: Self-pay

## 2014-11-17 ENCOUNTER — Emergency Department (HOSPITAL_COMMUNITY): Payer: Managed Care, Other (non HMO)

## 2014-11-17 DIAGNOSIS — R0602 Shortness of breath: Secondary | ICD-10-CM

## 2014-11-17 DIAGNOSIS — F419 Anxiety disorder, unspecified: Secondary | ICD-10-CM | POA: Insufficient documentation

## 2014-11-17 DIAGNOSIS — I209 Angina pectoris, unspecified: Secondary | ICD-10-CM | POA: Insufficient documentation

## 2014-11-17 DIAGNOSIS — R079 Chest pain, unspecified: Secondary | ICD-10-CM

## 2014-11-17 DIAGNOSIS — R5383 Other fatigue: Secondary | ICD-10-CM | POA: Diagnosis not present

## 2014-11-17 DIAGNOSIS — J984 Other disorders of lung: Secondary | ICD-10-CM | POA: Diagnosis not present

## 2014-11-17 DIAGNOSIS — K219 Gastro-esophageal reflux disease without esophagitis: Secondary | ICD-10-CM | POA: Diagnosis not present

## 2014-11-17 DIAGNOSIS — Z8619 Personal history of other infectious and parasitic diseases: Secondary | ICD-10-CM | POA: Diagnosis not present

## 2014-11-17 DIAGNOSIS — G4733 Obstructive sleep apnea (adult) (pediatric): Secondary | ICD-10-CM | POA: Diagnosis not present

## 2014-11-17 DIAGNOSIS — Z79899 Other long term (current) drug therapy: Secondary | ICD-10-CM | POA: Insufficient documentation

## 2014-11-17 DIAGNOSIS — J441 Chronic obstructive pulmonary disease with (acute) exacerbation: Secondary | ICD-10-CM | POA: Insufficient documentation

## 2014-11-17 DIAGNOSIS — R0789 Other chest pain: Secondary | ICD-10-CM | POA: Diagnosis not present

## 2014-11-17 DIAGNOSIS — J189 Pneumonia, unspecified organism: Secondary | ICD-10-CM

## 2014-11-17 DIAGNOSIS — J90 Pleural effusion, not elsewhere classified: Secondary | ICD-10-CM | POA: Diagnosis not present

## 2014-11-17 DIAGNOSIS — Z8701 Personal history of pneumonia (recurrent): Secondary | ICD-10-CM | POA: Diagnosis not present

## 2014-11-17 DIAGNOSIS — M1990 Unspecified osteoarthritis, unspecified site: Secondary | ICD-10-CM | POA: Diagnosis not present

## 2014-11-17 DIAGNOSIS — Z862 Personal history of diseases of the blood and blood-forming organs and certain disorders involving the immune mechanism: Secondary | ICD-10-CM | POA: Diagnosis not present

## 2014-11-17 DIAGNOSIS — J159 Unspecified bacterial pneumonia: Secondary | ICD-10-CM | POA: Diagnosis not present

## 2014-11-17 DIAGNOSIS — E669 Obesity, unspecified: Secondary | ICD-10-CM | POA: Diagnosis not present

## 2014-11-17 DIAGNOSIS — Q394 Esophageal web: Secondary | ICD-10-CM | POA: Insufficient documentation

## 2014-11-17 DIAGNOSIS — Z7982 Long term (current) use of aspirin: Secondary | ICD-10-CM | POA: Diagnosis not present

## 2014-11-17 LAB — I-STAT TROPONIN, ED
TROPONIN I, POC: 0 ng/mL (ref 0.00–0.08)
Troponin i, poc: 0 ng/mL (ref 0.00–0.08)

## 2014-11-17 LAB — CBC
HCT: 35.7 % — ABNORMAL LOW (ref 36.0–46.0)
Hemoglobin: 11.7 g/dL — ABNORMAL LOW (ref 12.0–15.0)
MCH: 28.9 pg (ref 26.0–34.0)
MCHC: 32.8 g/dL (ref 30.0–36.0)
MCV: 88.1 fL (ref 78.0–100.0)
Platelets: 591 10*3/uL — ABNORMAL HIGH (ref 150–400)
RBC: 4.05 MIL/uL (ref 3.87–5.11)
RDW: 15.1 % (ref 11.5–15.5)
WBC: 14.5 10*3/uL — AB (ref 4.0–10.5)

## 2014-11-17 LAB — I-STAT CHEM 8, ED
BUN: 14 mg/dL (ref 6–20)
CHLORIDE: 102 mmol/L (ref 101–111)
Calcium, Ion: 1.22 mmol/L (ref 1.12–1.23)
Creatinine, Ser: 0.7 mg/dL (ref 0.44–1.00)
Glucose, Bld: 102 mg/dL — ABNORMAL HIGH (ref 65–99)
HCT: 39 % (ref 36.0–46.0)
Hemoglobin: 13.3 g/dL (ref 12.0–15.0)
Potassium: 4.2 mmol/L (ref 3.5–5.1)
SODIUM: 139 mmol/L (ref 135–145)
TCO2: 21 mmol/L (ref 0–100)

## 2014-11-17 LAB — BASIC METABOLIC PANEL
ANION GAP: 3 — AB (ref 5–15)
BUN: 13 mg/dL (ref 6–20)
CHLORIDE: 108 mmol/L (ref 101–111)
CO2: 25 mmol/L (ref 22–32)
Calcium: 9.2 mg/dL (ref 8.9–10.3)
Creatinine, Ser: 0.65 mg/dL (ref 0.44–1.00)
GFR calc Af Amer: >60 mL/min (ref >60)
GFR calc non Af Amer: >60 mL/min (ref >60)
Glucose, Bld: 103 mg/dL — ABNORMAL HIGH (ref 65–99)
Potassium: 4.2 mmol/L (ref 3.5–5.1)
Sodium: 136 mmol/L (ref 135–145)

## 2014-11-17 LAB — BRAIN NATRIURETIC PEPTIDE: B Natriuretic Peptide: 20.8 pg/mL (ref 0.0–100.0)

## 2014-11-17 NOTE — ED Notes (Signed)
Pt reports central chest pressure/burning that began at 1100 this morning.  Pt reports the pain intermittently radiated to her jaw.  Pt was given 324mg  ASA and 1 Nitro PTA and reports she has no pain at this time.  Pt was recently admitted and diagnosed with Cardiomegaly.

## 2014-11-17 NOTE — ED Provider Notes (Signed)
CSN: [id number]     Arrival date & time 11/17/14  1358 History   First MD Initiated Contact with Patient 11/17/14 1500     Chief Complaint  Patient presents with  . Chest Pain     (Consider location/radiation/quality/duration/timing/severity/associated sxs/prior Treatment) The history is provided by the patient and medical records. No language interpreter was used.     Victoria [name] is a 59 y.o. female  with a hx of asthma, obesity, COPD presents to the Emergency Department complaining of gradual, persistent, progressively worsening fatigue onset last week with subsequent diagnosis and treatment for CAP.  Pt reports today about 12:00pm she began to feel central chest pressure increasing in intensity for approx 1 hour reaching maximum intensity with radiation into the right jaw and eye.  Pt reports she called 911 and feels significantly improved after nitroglycerine and ASA administration.  Pt reports the chest pain has been present in right lower chest since the PNA diagnosis.  Pt reports this pain is unchanged.  No other associated symptoms.  Pt denies nausea, diaphoresis, sweating, vomiting, weakness, near syncope, DOE or exertional chest pain.  Nothing makes it better and nothing makes it worse.  Pt denies fever, chills, headache, abd pain, N/V/D, weakness, dizziness, syncope.  She reports that she has been using her incentive spirometer and albuterol nebulizes without some relief, but not complete resolution.  She denies congestion and wheezing.  Pt reports she was seen on 11/13/14 by outpatient pulmonology with initiation of oral steroids and continuation of her abx.  CXR at that time showed improving but persistent PNA.   Record review shows she was initially admitted 11/08/14 - 11/09/14 and treated for CAP (Rx with azithro / ceftriaxone and discharged on Omnicef). She returned less than 24 hours later c/o right lateral and inferior chest pain (she did also have chest pain during her initial  admission). Pain described as worse with deep breaths, persistent, non-radiating, associated with SOB. She did report subjective fevers and chills but denied any cough / sputum production.  At that visit her initial troponin was negative. D-dimer was up but CTA negative for PE. CXR showed worsening bilateral PNA so she was admitted for further management. Her abx were broadened to vanc / zosyn initially but then de-escalated the following day back to azithromycin and ceftriaxone given her clinical improvement.  She was discharged on 11/11/14.    Past Medical History  Diagnosis Date  . Asthma   . Obesity   . Maxillary sinusitis   . Osteoarthritis   . IBS (irritable bowel syndrome)   . Hyperlipidemia   . COPD (chronic obstructive pulmonary disease)   . Pulmonary nodules   . Osteoporosis   . Nasal congestion 9/12    current cold  . Cough   . Wheezing   . GERD (gastroesophageal reflux disease)   . H/O hiatal hernia   . Depression     mild  . Allergic bronchopulmonary aspergillosis 2008    sees Dr Edmund Hilda pulmonology  . Anemia     iron deficiency, resolved  . Normal cardiac stress test 11/2011  . OSA (obstructive sleep apnea) 02/2012    has stopped using  cpap  . Anginal pain   . Chronic bronchitis   . Exertional dyspnea     "sometimes lying down; always w/exertion" (04/13/2012)  . Headache(784.0)     "often but not daily" (04/13/2012)  . Anxiety   . Pneumonia 11/2011    "before 2013 I hadn't had pneumonia since  I was a child" (04/13/2012)  . Schatzki's ring   . Pain in joint, ankle and foot 08/10/2014  . Palpitations 08/19/2008    Qualifier: Diagnosis of  By: Jerold Coombe     Past Surgical History  Procedure Laterality Date  . Cesarean section  1985  . Hernia repair  04/13/2012    VHR laparoscopic  . Appendectomy  1989  . Ventral hernia repair  04/13/2012    Procedure: LAPAROSCOPIC VENTRAL HERNIA;  Surgeon: Adin Hector, MD;  Location: [facility];  Service:  General;  Laterality: N/A;  laparoscopic repair of incarcerated hernia   Family History  Problem Relation Age of Onset  . Breast cancer Mother   . Prostate cancer Father   . Diverticulosis Father   . Prostate cancer Father     prostate  . Pulmonary embolism Brother     recurrent  . Breast cancer Sister     breast   History  Substance Use Topics  . Smoking status: Never Smoker   . Smokeless tobacco: Never Used  . Alcohol Use: 2.4 oz/week    4 Glasses of wine per week     Comment: 04/13/2012 "beer in the summer; wine 3-4 times/wk"   OB History    No data available     Review of Systems  Constitutional: Positive for fatigue. Negative for fever, diaphoresis, appetite change and unexpected weight change.  HENT: Negative for mouth sores.   Eyes: Negative for visual disturbance.  Respiratory: Positive for cough and shortness of breath. Negative for chest tightness and wheezing.   Cardiovascular: Positive for chest pain.  Gastrointestinal: Negative for nausea, vomiting, abdominal pain, diarrhea and constipation.  Endocrine: Negative for polydipsia, polyphagia and polyuria.  Genitourinary: Negative for dysuria, urgency, frequency and hematuria.  Musculoskeletal: Negative for back pain and neck stiffness.  Skin: Negative for rash.  Allergic/Immunologic: Negative for immunocompromised state.  Neurological: Negative for syncope, light-headedness and headaches.  Hematological: Does not bruise/bleed easily.  Psychiatric/Behavioral: Negative for sleep disturbance. The patient is not nervous/anxious.       Allergies  Beclomethasone dipropionate; Budesonide-formoterol fumarate; Mometasone furo-formoterol fum; Sulfonamide derivatives; and Statins  Home Medications   Prior to Admission medications   Medication Sig Start Date End Date Taking? Authorizing Provider  ALPRAZolam Duanne Moron) 0.5 MG tablet Take 1 tablet (0.5 mg total) by mouth 2 (two) times daily as needed for anxiety  (Palpatations, SOB). 07/29/14  Yes Mosie Lukes, MD  aspirin 81 MG tablet Take 81 mg by mouth daily.    Yes Historical Provider, MD  azithromycin (ZITHROMAX) 500 MG tablet Take 1 tablet (500 mg total) by mouth daily. 11/09/14  Yes Reyne Dumas, MD  Cholecalciferol (VITAMIN D) 2000 UNITS CAPS Take 2,000 Units by mouth daily.    Yes Historical Provider, MD  diphenhydrAMINE (SOMINEX) 25 MG tablet Take 25 mg by mouth at bedtime as needed for itching.   Yes Historical Provider, MD  fexofenadine (ALLEGRA) 180 MG tablet Take 180 mg by mouth daily as needed for allergies.    Yes Historical Provider, MD  furosemide (LASIX) 20 MG tablet Take 1 tablet (20 mg total) by mouth daily as needed. 11/17/14  Yes Reyne Dumas, MD  ibuprofen (ADVIL,MOTRIN) 200 MG tablet Take 200 mg by mouth every 6 (six) hours as needed for mild pain or moderate pain.   Yes Historical Provider, MD  ipratropium-albuterol (DUONEB) 0.5-2.5 (3) MG/3ML SOLN Inhale 3 mLs into the lungs 4 (four) times daily as needed. Patient taking differently: Inhale  3 mLs into the lungs 4 (four) times daily as needed (wheezing or shortness of breath).  05/19/14  Yes Brand Males, MD  mometasone (ASMANEX 60 METERED DOSES) 220 MCG/INH inhaler USE 2 INHALATIONS ORALLY EVERY EVENING 11/06/13  Yes Brand Males, MD  montelukast (SINGULAIR) 10 MG tablet TAKE 1 TABLET BY MOUTH TWICE DAILY Patient taking differently: Take 10 mg by mouth. TAKE 1 TABLET BY MOUTH once or TWICE DAILY 11/06/13  Yes Brand Males, MD  oxyCODONE-acetaminophen (PERCOCET/ROXICET) 5-325 MG per tablet Take 1 tablet by mouth every 8 (eight) hours as needed for severe pain. 11/11/14  Yes Delfina Redwood, MD  predniSONE (DELTASONE) 10 MG tablet Take 4 tabs for 2 days, then 3 tabs for 2 days, 2 tabs for 2 days, then 1 tab for 2 days, then stop. 11/14/14  Yes Tammy S Parrett, NP  ranitidine (ZANTAC) 300 MG tablet Take 1 tablet (300 mg total) by mouth at bedtime. 04/23/14  Yes Mosie Lukes,  MD  tiotropium (SPIRIVA HANDIHALER) 18 MCG inhalation capsule INHALE CONTENTS OF 1 CAPSULE THROUGH HANDIHALER DEVICE EVERY DAY 11/06/13  Yes Brand Males, MD  triamcinolone (NASACORT AQ) 55 MCG/ACT AERO nasal inhaler Place 2 sprays into the nose daily as needed (for allergies).    Yes Historical Provider, MD  venlafaxine XR (EFFEXOR-XR) 150 MG 24 hr capsule Take 150 mg by mouth daily with breakfast.   Yes Historical Provider, MD  cefdinir (OMNICEF) 300 MG capsule Take 2 capsules (600 mg total) by mouth 2 (two) times daily. Patient not taking: Reported on 11/17/2014 11/09/14   Reyne Dumas, MD  HYDROcodone-homatropine [facility]) 5-1.5 MG/5ML syrup Take 5 mLs by mouth every 6 (six) hours as needed for cough. Patient not taking: Reported on 11/13/2014 11/09/14   Reyne Dumas, MD   BP 103/73 mmHg  Pulse 88  Temp(Src) 98.5 F (36.9 C) (Oral)  Resp 15  Ht 5\' 2"  (1.575 m)  Wt 170 lb (77.111 kg)  BMI 31.09 kg/m2  SpO2 100% Physical Exam  Constitutional: She appears well-developed and well-nourished. No distress.  Awake, alert, nontoxic appearance  HENT:  Head: Normocephalic and atraumatic.  Mouth/Throat: Oropharynx is clear and moist. No oropharyngeal exudate.  Eyes: Conjunctivae are normal. No scleral icterus.  Neck: Normal range of motion. Neck supple.  Cardiovascular: Normal rate, regular rhythm, normal heart sounds and intact distal pulses.   No murmur heard. Pulmonary/Chest: Effort normal and breath sounds normal. No respiratory distress. She has no wheezes.  Equal chest expansion  Abdominal: Soft. Bowel sounds are normal. She exhibits no mass. There is no tenderness. There is no rebound and no guarding.  Musculoskeletal: Normal range of motion. She exhibits no edema.  Neurological: She is alert.  Speech is clear and goal oriented Moves extremities without ataxia  Skin: Skin is warm and dry. She is not diaphoretic.  Psychiatric: She has a normal mood and affect.  Nursing note and  vitals reviewed.   ED Course  Procedures (including critical care time) Labs Review Labs Reviewed  CBC - Abnormal; Notable for the following:    WBC 14.5 (*)    Hemoglobin 11.7 (*)    HCT 35.7 (*)    Platelets 591 (*)    All other components within normal limits  BASIC METABOLIC PANEL - Abnormal; Notable for the following:    Glucose, Bld 103 (*)    Anion gap 3 (*)    All other components within normal limits  I-STAT CHEM 8, ED - Abnormal; Notable for the  following:    Glucose, Bld 102 (*)    All other components within normal limits  BRAIN NATRIURETIC PEPTIDE  I-STAT TROPOININ, ED  I-STAT TROPOININ, ED    Imaging Review Dg Chest Port 1 View  11/17/2014   CLINICAL DATA:  Chest pressure, pain and burning beginning this morning.  EXAM: PORTABLE CHEST - 1 VIEW  COMPARISON:  PA and lateral chest 11/13/2014.  CT chest 11/09/2014.  FINDINGS: Right basilar airspace disease and likely small right pleural effusion persist. Aeration appears slightly improved. The left lung is clear. Heart size is mildly enlarged.  IMPRESSION: Mildly improved appearance of a small right pleural effusion and right lower lobe airspace disease.   Electronically Signed   By: Inge Rise M.D.   On: 11/17/2014 14:27     EKG Interpretation   Date/Time:  Monday Nov 17 2014 14:02:30 EDT Ventricular Rate:  76 PR Interval:  148 QRS Duration: 83 QT Interval:  402 QTC Calculation: 452 R Axis:   51 Text Interpretation:  Sinus rhythm No significant change since last  tracing Confirmed by Mingo Amber  MD, [place] ([id number]) on 11/17/2014 2:55:05 PM      MDM   Final diagnoses:  Chest pain, unspecified chest pain type  CAP (community acquired pneumonia)  SOB (shortness of breath)  Other fatigue   Marya Fossa presents with centralized chest pressure and pain onset approximately noon today. Patient endorses significant fatigue for the last several months. She was recently diagnosed and treated for community-acquired  pneumonia. She continues to have the right-sided pleuritic chest pain but the centralized chest pressure today was different.  Labs reassuring. Patient with mild leukocytosis of 14.5 however she has been on sterile voids for this. Lungs sounds are clear and equal. First troponin is negative.  EKG is nonischemic and chest x-ray is with improved appearance of right lower airspace disease.  Plan for a delta troponin at 6 PM.  Pt not PERC negative only due to age.  No tachycardia here in the emergency department, no hypoxia or hemoptysis.  7:32 PM Delta troponin negative. Patient is remained chest pain-free throughout her time emergency department.  Her symptoms were not exertional. She had negative CT angiogram that previous workup.  Mild edema and cardiomegaly noted on chest x-ray on 11/13/2014 however no edema is today.  No rales on exam or heart murmur.  Chest pain is not likely of cardiac or pulmonary etiology d/t presentation, PERC negative, VSS, no tracheal deviation, no JVD or new murmur, RRR, breath sounds equal bilaterally, EKG without acute abnormalities, negative troponin, and negative CXR.  Will plan for close cardiology follow-up this week in addition to primary care follow-up. Should return precautions given for return of chest pain, shortness of breath or other concerns.  Pt has also been advised to return to the ED if CP becomes exertional, associated with diaphoresis or nausea, radiates to left jaw/arm, worsens or becomes concerning in any way.   Case has been discussed with and seen by Dr. Mingo Amber who agrees with the above plan to discharge.     Jarrett Soho Mariely Mahr, PA-C 11/18/14 [id number]  Evelina Bucy, MD 11/20/14 [phone]

## 2014-11-17 NOTE — Discharge Instructions (Signed)
1. Medications: usual home medications 2. Treatment: rest, drink plenty of fluids,  3. Follow Up: Please followup with your primary doctor in 4 days at your scheduled appointment for discussion of your diagnoses and further evaluation after today's visit; please also follow up with [facility] cardiology as soon as possible; Please return to the ER for chest pain, worsening shortness of breath or other concerning symptoms.

## 2014-11-18 ENCOUNTER — Inpatient Hospital Stay: Payer: Managed Care, Other (non HMO) | Admitting: Adult Health

## 2014-11-18 ENCOUNTER — Telehealth: Payer: Self-pay | Admitting: Family Medicine

## 2014-11-18 MED ORDER — NITROGLYCERIN 0.4 MG SL SUBL: 0.4000 mg | SUBLINGUAL_TABLET | SUBLINGUAL | Status: DC | PRN

## 2014-11-18 NOTE — Telephone Encounter (Signed)
Pt wanted to call and add to previous message Richardson Dopp PA, Cardiologist will see pt 11/19/14 at 10:30am

## 2014-11-18 NOTE — Telephone Encounter (Signed)
They will take care of her well tomorrow. I am OK for her to have Nitroglycerin 0.4 mg tab 1 ta SL prn CP, may repeat x 3, give one tab q 5 minutes til pain relief, if no relief after 3 pills to ER. Disp #25

## 2014-11-18 NOTE — Telephone Encounter (Signed)
Prescription sent and patient informed  

## 2014-11-18 NOTE — Telephone Encounter (Signed)
Caller name: Taiana Relation to MV:HQIO Call back number:  [phone] Pharmacy: [facility]  Reason for call:   Patient was seen in ED for chest pain. Patient has appointment with cardiologist on 12/25/14 but wants to know if we can get patient in any sooner? She is also requesting nytroglycerin. EMT gave her .4mg  yesterday. Denies SOB and chest pain currently.

## 2014-11-18 NOTE — Progress Notes (Signed)
Cardiology Office Note   Date:  11/19/2014   ID:  Victoria [name], DOB 02-24-1956, MRN [id number]  PCP:  [name], MD  Cardiologist:  Dr. Ena Dawley     Chief Complaint  Patient presents with  . Chest Pain     History of Present Illness: Victoria [name] is a 59 y.o. female with a hx of severe persistent asthma, HL, obesity.  She had a normal stress test in 2013.  She was evaluated by Dr. Ena Dawley 3/15 for chest pain.  Cardiac CTA was recommended.  Her insurance denied paying for this test.  Stress echo was arranged but the patient declined to schedule it.    Admitted 5/21-5/22 with bilateral lower lobe community acquired pneumonia.  She was readmitted within 24 hours for R lateral and inferior chest pain.  CTA was neg for PE.  Troponins were negative.  CXR showed worsening bilateral PNA.  She returned to the ED with chest pain on 5/30.  CEs were neg.  She returns for Cardiology FU.     She tells me that she has been having severe fatigue and worse DOE for the last 6 mos.  She has not felt like this is related to her asthma.  She has seen her primary care physician as well as her pulmonologist several times. She has also been to the emergency room several times. She has had her thyroid tests checked. She has had follow-up pulmonary tests. Symptoms worsened with associated fever cough several weeks ago. That's when she was diagnosed with bilateral lower lobe pneumonia. Presenting symptoms did improve with antibodies. However, she has continued to experience fatigue and dyspnea. She notes decreased exercise tolerance with elevated heart rate and blood pressure, palpitations with any type of activity. She has chest pain frequently. She cannot really reproduce it with exertion. However, she has significant symptoms at rest. Chest discomfort has awoken her from sleep at times. She describes substernal pressure. She has felt it radiating up into her jaw. She has noted associated nausea,  diaphoresis and dyspnea. She denies syncope. When she first came home from the hospital with pneumonia, she had to sleep in a recliner. Since then she denies orthopnea. She does have occasional LE edema. She takes Lasix as needed. She is not certain if Lasix improved her breathing. She summoned EMS earlier this week for severe chest pain. She was given nitroglycerin by the EMT with prompt relief.   Studies/Reports Reviewed Today:  Chest CTA 11/09/14 IMPRESSION: 1. No evidence of pulmonary embolism. 2. Findings again worrisome for right lower lobe pneumonia. A follow-up chest radiograph in 3-4 weeks after treatment is recommended to ensure resolution. 3. Punctate approximately 6 mm nodule within the right lower lobe is grossly unchanged since the 11/2011 examination and thus of benign etiology - evaluation for additional discrete pulmonary nodules is degraded secondary to concomitant superimposed acute infectious process within the right lower lobe. 4. Unchanged biapical pleural parenchymal thickening. 5. Unchanged moderate-sized [place] and hiatal hernias.  Myoview 12/14/11 IMPRESSION: No evidence of ischemia or infarct. Calculated ejection fraction 72%.  Past Medical History  Diagnosis Date  . Asthma   . Obesity   . Maxillary sinusitis   . Osteoarthritis   . IBS (irritable bowel syndrome)   . Hyperlipidemia   . COPD (chronic obstructive pulmonary disease)   . Pulmonary nodules   . Osteoporosis   . Nasal congestion 9/12    current cold  . Cough   . Wheezing   . GERD (  gastroesophageal reflux disease)   . H/O hiatal hernia   . Depression     mild  . Allergic bronchopulmonary aspergillosis 2008    sees Dr Edmund Hilda pulmonology  . Anemia     iron deficiency, resolved  . Normal cardiac stress test 11/2011  . OSA (obstructive sleep apnea) 02/2012    has stopped using  cpap  . Anginal pain   . Chronic bronchitis   . Exertional dyspnea     "sometimes lying down;  always w/exertion" (04/13/2012)  . Headache(784.0)     "often but not daily" (04/13/2012)  . Anxiety   . Pneumonia 11/2011    "before 2013 I hadn't had pneumonia since I was a child" (04/13/2012)  . Schatzki's ring   . Pain in joint, ankle and foot 08/10/2014  . Palpitations 08/19/2008    Qualifier: Diagnosis of  By: Jerold Coombe      Past Surgical History  Procedure Laterality Date  . Cesarean section  1985  . Hernia repair  04/13/2012    VHR laparoscopic  . Appendectomy  1989  . Ventral hernia repair  04/13/2012    Procedure: LAPAROSCOPIC VENTRAL HERNIA;  Surgeon: Adin Hector, MD;  Location: [facility];  Service: General;  Laterality: N/A;  laparoscopic repair of incarcerated hernia     Current Outpatient Prescriptions  Medication Sig Dispense Refill  . ALPRAZolam (XANAX) 0.5 MG tablet Take 1 tablet (0.5 mg total) by mouth 2 (two) times daily as needed for anxiety (Palpatations, SOB). 30 tablet 1  . aspirin 81 MG tablet Take 81 mg by mouth daily.     . Cholecalciferol (VITAMIN D) 2000 UNITS CAPS Take 2,000 Units by mouth daily.     . diphenhydrAMINE (SOMINEX) 25 MG tablet Take 25 mg by mouth at bedtime as needed for itching.    . fexofenadine (ALLEGRA) 180 MG tablet Take 180 mg by mouth daily as needed for allergies.     . furosemide (LASIX) 20 MG tablet Take 1 tablet (20 mg total) by mouth daily as needed. 30 tablet 1  . HYDROcodone-homatropine (HYCODAN) 5-1.5 MG/5ML syrup Take 5 mLs by mouth every 6 (six) hours as needed for cough. 240 mL 0  . ibuprofen (ADVIL,MOTRIN) 200 MG tablet Take 200 mg by mouth every 6 (six) hours as needed for mild pain or moderate pain.    Marland Kitchen ipratropium-albuterol (DUONEB) 0.5-2.5 (3) MG/3ML SOLN Inhale 3 mLs into the lungs 4 (four) times daily as needed. (Patient taking differently: Inhale 3 mLs into the lungs 4 (four) times daily as needed (wheezing or shortness of breath). ) 360 mL 5  . mometasone (ASMANEX 60 METERED DOSES) 220 MCG/INH inhaler USE 2  INHALATIONS ORALLY EVERY EVENING 3 Inhaler 1  . montelukast (SINGULAIR) 10 MG tablet TAKE 1 TABLET BY MOUTH TWICE DAILY (Patient taking differently: Take 10 mg by mouth. TAKE 1 TABLET BY MOUTH once or TWICE DAILY) 180 tablet 3  . nitroGLYCERIN (NITROSTAT) 0.4 MG SL tablet Place 1 tablet (0.4 mg total) under the tongue every 5 (five) minutes as needed for chest pain. 25 tablet 0  . predniSONE (DELTASONE) 10 MG tablet Take 4 tabs for 2 days, then 3 tabs for 2 days, 2 tabs for 2 days, then 1 tab for 2 days, then stop. 20 tablet 0  . ranitidine (ZANTAC) 300 MG tablet Take 1 tablet (300 mg total) by mouth at bedtime. 90 tablet 1  . tiotropium (SPIRIVA HANDIHALER) 18 MCG inhalation capsule INHALE CONTENTS OF 1  CAPSULE THROUGH HANDIHALER DEVICE EVERY DAY 90 capsule 3  . triamcinolone (NASACORT AQ) 55 MCG/ACT AERO nasal inhaler Place 2 sprays into the nose daily as needed (for allergies).     . venlafaxine XR (EFFEXOR-XR) 150 MG 24 hr capsule Take 150 mg by mouth daily with breakfast.     No current facility-administered medications for this visit.    Allergies:   Beclomethasone dipropionate; Budesonide-formoterol fumarate; Mometasone furo-formoterol fum; Sulfonamide derivatives; and Statins    Social History:  The patient  reports that she has never smoked. She has never used smokeless tobacco. She reports that she drinks about 2.4 oz of alcohol per week. She reports that she does not use illicit drugs.   Family History:  The patient's family history includes Breast cancer in her mother and sister; Diabetes in her mother; Diverticulosis in her father; Heart attack in her maternal grandfather; Hypertension in her mother; Prostate cancer in her father; Pulmonary embolism in her brother. There is no history of Stroke.    ROS:   Please see the history of present illness.   Review of Systems  Constitution: Positive for chills, malaise/fatigue and weight gain.  Cardiovascular: Positive for chest pain,  dyspnea on exertion, leg swelling and orthopnea.  Respiratory: Positive for cough, snoring and wheezing.   Psychiatric/Behavioral: Positive for depression.  All other systems reviewed and are negative.    PHYSICAL EXAM: VS:  BP 110/70 mmHg  Pulse 74  Ht 5\' 2"  (1.575 m)  Wt 168 lb (76.204 kg)  BMI 30.72 kg/m2    Wt Readings from Last 3 Encounters:  11/19/14 168 lb (76.204 kg)  11/17/14 170 lb (77.111 kg)  11/13/14 174 lb 3.2 oz (79.017 kg)     GEN: Well nourished, well developed, in no acute distress HEENT: normal Neck: no JVD, no carotid bruits, no masses Cardiac:  Normal T4/S5, RRR; 1/6 systolic murmur RUSB,  no rubs or gallops, no edema   Respiratory:  clear to auscultation bilaterally, no wheezing, rhonchi or rales. GI: soft, nontender, nondistended, + BS MS: no deformity or atrophy Skin: warm and dry  Neuro:  CNs II-XII intact, Strength and sensation are intact Psych: Normal affect   EKG:  EKG is ordered today.  It demonstrates:   NSR, HR 74, normal axis, no ST changes   Recent Labs: 09/03/2014: TSH 1.85 11/10/2014: ALT 66* 11/17/2014: B Natriuretic Peptide 20.8 11/19/2014: BUN 16; Creatinine 0.64; Hemoglobin 12.9; Platelets 811.0*; Potassium 4.4; Sodium 136    Lipid Panel    Component Value Date/Time   CHOL 186 09/04/2012 1425   TRIG 90.0 09/04/2012 1425   HDL 43.30 09/04/2012 1425   CHOLHDL 4 09/04/2012 1425   VLDL 18.0 09/04/2012 1425   LDLCALC 125* 09/04/2012 1425   LDLDIRECT 177.8 09/07/2011 0945      ASSESSMENT AND PLAN:  Precordial chest pain:  The patient presents with 6 months of progressively worsening fatigue and dyspnea with exertion. Symptoms seem to be out of proportion to her asthma. She did have a recent admission to the hospital for pneumonia. Symptoms related to pneumonia did improve. However, she continues to have significant dyspnea with exertion and fatigue. She's also had severe chest pain which has some symptoms concerning for angina.  Chest discomfort is nitroglycerin responsive. She is quite concerned about her symptoms. We discussed the possibility of proceeding with stress testing versus definitive evaluation with cardiac catheterization. She prefers a definitive evaluation and actually requests cardiac catheterization today. I reviewed this with Dr. Ron Parker (DOD).  He agreed with proceeding with cardiac catheterization.  Risks and benefits of cardiac catheterization have been discussed with the patient.  These include bleeding, infection, kidney damage, stroke, heart attack, death.  The patient understands these risks and is willing to proceed. Continue aspirin. Hold off on beta blocker given her history of asthma. She has when necessary nitroglycerin already.  I will also obtain a 2-D Echocardiogram to assess systolic and diastolic function, valves and R heart pressures.  HLD (hyperlipidemia): She is intolerant to statins. If significant CAD found, consider referral to the lipid clinic for PCSK-9 therapy.  Asthma, severe persistent, uncomplicated: Follow-up with pulmonology as planned.   Current medicines are reviewed at length with the patient today.  Concerns regarding medicines are as outlined above.  The following changes have been made:    None    Labs/ tests ordered today include:  Orders Placed This Encounter  Procedures  . Basic Metabolic Panel (BMET)  . CBC w/Diff  . INR/PT  . EKG 12-Lead  . Echocardiogram    Disposition:   FU with Dr. Ena Dawley after her cardiac cath.    Signed, Versie Starks, MHS 11/19/2014 4:29 PM    [facility] [place], [place], [place]  [address] Phone: [phone]; Fax: [phone]

## 2014-11-19 ENCOUNTER — Encounter: Payer: Self-pay | Admitting: Physician Assistant

## 2014-11-19 ENCOUNTER — Ambulatory Visit (INDEPENDENT_AMBULATORY_CARE_PROVIDER_SITE_OTHER): Payer: Managed Care, Other (non HMO) | Admitting: Physician Assistant

## 2014-11-19 ENCOUNTER — Encounter: Payer: Self-pay | Admitting: *Deleted

## 2014-11-19 VITALS — BP 110/70 | HR 74 | Ht 62.0 in | Wt 168.0 lb

## 2014-11-19 DIAGNOSIS — J4550 Severe persistent asthma, uncomplicated: Secondary | ICD-10-CM | POA: Diagnosis not present

## 2014-11-19 DIAGNOSIS — R0789 Other chest pain: Secondary | ICD-10-CM | POA: Diagnosis not present

## 2014-11-19 DIAGNOSIS — R0602 Shortness of breath: Secondary | ICD-10-CM | POA: Diagnosis not present

## 2014-11-19 DIAGNOSIS — R072 Precordial pain: Secondary | ICD-10-CM | POA: Diagnosis not present

## 2014-11-19 DIAGNOSIS — E785 Hyperlipidemia, unspecified: Secondary | ICD-10-CM | POA: Diagnosis not present

## 2014-11-19 LAB — CBC WITH DIFFERENTIAL/PLATELET
BASOS ABS: 0 10*3/uL (ref 0.0–0.1)
Basophils Relative: 0.2 % (ref 0.0–3.0)
EOS ABS: 0.1 10*3/uL (ref 0.0–0.7)
EOS PCT: 0.9 % (ref 0.0–5.0)
HCT: 39.5 % (ref 36.0–46.0)
Hemoglobin: 12.9 g/dL (ref 12.0–15.0)
Lymphocytes Relative: 9.4 % — ABNORMAL LOW (ref 12.0–46.0)
Lymphs Abs: 1.3 10*3/uL (ref 0.7–4.0)
MCHC: 32.6 g/dL (ref 30.0–36.0)
MCV: 89.4 fl (ref 78.0–100.0)
Monocytes Absolute: 0.1 10*3/uL (ref 0.1–1.0)
Monocytes Relative: 0.7 % — ABNORMAL LOW (ref 3.0–12.0)
Neutro Abs: 12 10*3/uL — ABNORMAL HIGH (ref 1.4–7.7)
PLATELETS: 811 10*3/uL — AB (ref 150.0–400.0)
RBC: 4.42 Mil/uL (ref 3.87–5.11)
RDW: 15.9 % — ABNORMAL HIGH (ref 11.5–15.5)
WBC: 13.6 10*3/uL — ABNORMAL HIGH (ref 4.0–10.5)

## 2014-11-19 LAB — PROTIME-INR
INR: 1 ratio (ref 0.8–1.0)
Prothrombin Time: 11.2 s (ref 9.6–13.1)

## 2014-11-19 LAB — BASIC METABOLIC PANEL
BUN: 16 mg/dL (ref 6–23)
CO2: 29 meq/L (ref 19–32)
Calcium: 9.2 mg/dL (ref 8.4–10.5)
Chloride: 101 mEq/L (ref 96–112)
Creatinine, Ser: 0.64 mg/dL (ref 0.40–1.20)
GFR: 101.46 mL/min (ref >60.00)
GLUCOSE: 94 mg/dL (ref 70–99)
Potassium: 4.4 mEq/L (ref 3.5–5.1)
Sodium: 136 mEq/L (ref 135–145)

## 2014-11-19 NOTE — Patient Instructions (Signed)
Medication Instructions:  Your physician recommends that you continue on your current medications as directed. Please refer to the Current Medication list given to you today.   Labwork: TODAY BMET, CBC W/DIFF, PT/INR  Testing/Procedures: 1. Your physician has requested that you have a cardiac catheterization. Cardiac catheterization is used to diagnose and/or treat various heart conditions. Doctors may recommend this procedure for a number of different reasons. The most common reason is to evaluate chest pain. Chest pain can be a symptom of coronary artery disease (CAD), and cardiac catheterization can show whether plaque is narrowing or blocking your heart's arteries. This procedure is also used to evaluate the valves, as well as measure the blood flow and oxygen levels in different parts of your heart. For further information please visit [facility]ugeFiesta[facility]tn. Please follow instruction sheet, as given.  2. Your physician has requested that you have an echocardiogram. Echocardiography is a painless test that uses sound waves to create images of your heart. It provides your doctor with information about the size and shape of your heart and how well your heart's chambers and valves are working. This procedure takes approximately one hour. There are no restrictions for this procedure.  Follow-Up: YOU HAVE A FOLLOW UP WITH SCOTT WEAVER, 12/11/14 12:10 TO FOLLOW UP FROM YOUR CATH  Any Other Special Instructions Will Be Listed Below (If Applicable).

## 2014-11-20 ENCOUNTER — Other Ambulatory Visit: Payer: Self-pay | Admitting: Internal Medicine

## 2014-11-20 ENCOUNTER — Telehealth: Payer: Self-pay | Admitting: *Deleted

## 2014-11-20 DIAGNOSIS — R5383 Other fatigue: Secondary | ICD-10-CM

## 2014-11-20 DIAGNOSIS — R0602 Shortness of breath: Secondary | ICD-10-CM

## 2014-11-20 DIAGNOSIS — E785 Hyperlipidemia, unspecified: Secondary | ICD-10-CM

## 2014-11-20 NOTE — Telephone Encounter (Signed)
lmptcb to go over lab results 

## 2014-11-20 NOTE — Telephone Encounter (Signed)
Pt has been notified of lab results. Pt denies any fever for the last 9 days, repeat cbc w/diff 6/10. Pt also asked if she could have her cholesterol panel done that day, ok per Nicki Reaper W. PA. Pt wanted her echo time earlier, moved appt to 10:30. I will fax results to Dr. Charlett Blake today.

## 2014-11-20 NOTE — Telephone Encounter (Signed)
Ok thanks for update

## 2014-11-20 NOTE — Telephone Encounter (Signed)
Follow up ° ° ° ° ° °Returning Carol's call °

## 2014-11-21 ENCOUNTER — Encounter (HOSPITAL_BASED_OUTPATIENT_CLINIC_OR_DEPARTMENT_OTHER): Payer: Self-pay | Admitting: *Deleted

## 2014-11-21 ENCOUNTER — Encounter: Payer: Self-pay | Admitting: Family Medicine

## 2014-11-21 ENCOUNTER — Emergency Department (HOSPITAL_BASED_OUTPATIENT_CLINIC_OR_DEPARTMENT_OTHER): Payer: Managed Care, Other (non HMO)

## 2014-11-21 ENCOUNTER — Emergency Department (HOSPITAL_BASED_OUTPATIENT_CLINIC_OR_DEPARTMENT_OTHER)
Admission: EM | Admit: 2014-11-21 | Discharge: 2014-11-21 | Disposition: A | Discharge status: Home / Self Care | Payer: Managed Care, Other (non HMO) | Attending: Emergency Medicine | Admitting: Emergency Medicine

## 2014-11-21 ENCOUNTER — Ambulatory Visit (INDEPENDENT_AMBULATORY_CARE_PROVIDER_SITE_OTHER): Payer: Managed Care, Other (non HMO) | Admitting: Family Medicine

## 2014-11-21 VITALS — BP 112/68 | HR 93 | Temp 98.3°F | Wt 167.1 lb

## 2014-11-21 DIAGNOSIS — M1990 Unspecified osteoarthritis, unspecified site: Secondary | ICD-10-CM | POA: Diagnosis not present

## 2014-11-21 DIAGNOSIS — G4733 Obstructive sleep apnea (adult) (pediatric): Secondary | ICD-10-CM

## 2014-11-21 DIAGNOSIS — Z7982 Long term (current) use of aspirin: Secondary | ICD-10-CM | POA: Diagnosis not present

## 2014-11-21 DIAGNOSIS — F329 Major depressive disorder, single episode, unspecified: Secondary | ICD-10-CM | POA: Diagnosis not present

## 2014-11-21 DIAGNOSIS — Z79899 Other long term (current) drug therapy: Secondary | ICD-10-CM | POA: Diagnosis not present

## 2014-11-21 DIAGNOSIS — J4550 Severe persistent asthma, uncomplicated: Secondary | ICD-10-CM

## 2014-11-21 DIAGNOSIS — Z7951 Long term (current) use of inhaled steroids: Secondary | ICD-10-CM | POA: Insufficient documentation

## 2014-11-21 DIAGNOSIS — Q394 Esophageal web: Secondary | ICD-10-CM | POA: Insufficient documentation

## 2014-11-21 DIAGNOSIS — Z862 Personal history of diseases of the blood and blood-forming organs and certain disorders involving the immune mechanism: Secondary | ICD-10-CM | POA: Diagnosis not present

## 2014-11-21 DIAGNOSIS — E785 Hyperlipidemia, unspecified: Secondary | ICD-10-CM

## 2014-11-21 DIAGNOSIS — F419 Anxiety disorder, unspecified: Secondary | ICD-10-CM | POA: Insufficient documentation

## 2014-11-21 DIAGNOSIS — J449 Chronic obstructive pulmonary disease, unspecified: Secondary | ICD-10-CM | POA: Diagnosis not present

## 2014-11-21 DIAGNOSIS — M549 Dorsalgia, unspecified: Secondary | ICD-10-CM | POA: Diagnosis not present

## 2014-11-21 DIAGNOSIS — Z8619 Personal history of other infectious and parasitic diseases: Secondary | ICD-10-CM | POA: Insufficient documentation

## 2014-11-21 DIAGNOSIS — R079 Chest pain, unspecified: Secondary | ICD-10-CM | POA: Diagnosis present

## 2014-11-21 DIAGNOSIS — E669 Obesity, unspecified: Secondary | ICD-10-CM | POA: Diagnosis not present

## 2014-11-21 DIAGNOSIS — R0789 Other chest pain: Secondary | ICD-10-CM

## 2014-11-21 DIAGNOSIS — Z8701 Personal history of pneumonia (recurrent): Secondary | ICD-10-CM | POA: Diagnosis not present

## 2014-11-21 DIAGNOSIS — K2100 Gastro-esophageal reflux disease with esophagitis, without bleeding: Secondary | ICD-10-CM

## 2014-11-21 DIAGNOSIS — I517 Cardiomegaly: Secondary | ICD-10-CM

## 2014-11-21 DIAGNOSIS — K219 Gastro-esophageal reflux disease without esophagitis: Secondary | ICD-10-CM | POA: Insufficient documentation

## 2014-11-21 DIAGNOSIS — M546 Pain in thoracic spine: Secondary | ICD-10-CM

## 2014-11-21 DIAGNOSIS — I209 Angina pectoris, unspecified: Secondary | ICD-10-CM | POA: Diagnosis not present

## 2014-11-21 LAB — BASIC METABOLIC PANEL
Anion gap: 7 (ref 5–15)
BUN: 13 mg/dL (ref 6–20)
CALCIUM: 8.8 mg/dL — AB (ref 8.9–10.3)
CO2: 28 mmol/L (ref 22–32)
Chloride: 102 mmol/L (ref 101–111)
Creatinine, Ser: 0.63 mg/dL (ref 0.44–1.00)
Glucose, Bld: 94 mg/dL (ref 65–99)
POTASSIUM: 3.8 mmol/L (ref 3.5–5.1)
Sodium: 137 mmol/L (ref 135–145)

## 2014-11-21 LAB — CBC
HCT: 37 % (ref 36.0–46.0)
Hemoglobin: 11.8 g/dL — ABNORMAL LOW (ref 12.0–15.0)
MCH: 28.6 pg (ref 26.0–34.0)
MCHC: 31.9 g/dL (ref 30.0–36.0)
MCV: 89.8 fL (ref 78.0–100.0)
PLATELETS: 638 10*3/uL — AB (ref 150–400)
RBC: 4.12 MIL/uL (ref 3.87–5.11)
RDW: 15 % (ref 11.5–15.5)
WBC: 10.4 10*3/uL (ref 4.0–10.5)

## 2014-11-21 LAB — TROPONIN I: Troponin I: <0.03 ng/mL (ref <0.031)

## 2014-11-21 MED ORDER — MORPHINE SULFATE 4 MG/ML IJ SOLN
4.0000 mg | Freq: Once | INTRAMUSCULAR | Status: AC
  Administered 2014-11-21: 4 mg via INTRAVENOUS
  Filled 2014-11-21: qty 1

## 2014-11-21 NOTE — Assessment & Plan Note (Signed)
Patient denies any recent dyspepsia

## 2014-11-21 NOTE — ED Notes (Signed)
Pt sent down from Dr. Azalee Course office. She has been having central chest fluttering x2 weeks. Last night, she developed mid to lower back pain that she describes as an intense ache. She is scheduled for a cardiac cath next Tuesday. She received a sublingual nitro at Dr. Azalee Course office which helped her pain.

## 2014-11-21 NOTE — ED Provider Notes (Signed)
CSN: [id number]     Arrival date & time 11/21/14  1359 History   First MD Initiated Contact with Patient 11/21/14 1424     Chief Complaint  Patient presents with  . Chest Pain     (Consider location/radiation/quality/duration/timing/severity/associated sxs/prior Treatment) HPI Patient presents to the emergency department with back discomfort that started earlier this morning.  The patient states that she woke up around 3 AM with back discomfort.  She states she felt palpitations in her heart as well.  She went to her primary care doctor.  Patient states that she is due to see her cardiologist on Tuesday for an elective catheterization.  Patient states that she is having more back pain today rather than chest discomfort, but she was having some palpitations.  The patient states she was given nitroglycerin at her doctor's office which helped with her symptoms.  Patient denies shortness of breath, nausea, vomiting, weakness, dizziness, headache, blurred vision,  neck pain, fever, cough, abdominal pain, or syncope.  The patient states that she was told by her cardiologist come to the emergency department for any other symptoms Past Medical History  Diagnosis Date  . Asthma   . Obesity   . Maxillary sinusitis   . Osteoarthritis   . IBS (irritable bowel syndrome)   . Hyperlipidemia   . COPD (chronic obstructive pulmonary disease)   . Pulmonary nodules   . Osteoporosis   . Nasal congestion 9/12    current cold  . Cough   . Wheezing   . GERD (gastroesophageal reflux disease)   . H/O hiatal hernia   . Depression     mild  . Allergic bronchopulmonary aspergillosis 2008    sees Dr Edmund Hilda pulmonology  . Anemia     iron deficiency, resolved  . Normal cardiac stress test 11/2011  . OSA (obstructive sleep apnea) 02/2012    has stopped using  cpap  . Anginal pain   . Chronic bronchitis   . Exertional dyspnea     "sometimes lying down; always w/exertion" (04/13/2012)  .  Headache(784.0)     "often but not daily" (04/13/2012)  . Anxiety   . Pneumonia 11/2011    "before 2013 I hadn't had pneumonia since I was a child" (04/13/2012)  . Schatzki's ring   . Pain in joint, ankle and foot 08/10/2014  . Palpitations 08/19/2008    Qualifier: Diagnosis of  By: Jerold Coombe    . Acute thoracic back pain 11/21/2014  . Cardiomegaly 11/21/2014   Past Surgical History  Procedure Laterality Date  . Cesarean section  1985  . Hernia repair  04/13/2012    VHR laparoscopic  . Appendectomy  1989  . Ventral hernia repair  04/13/2012    Procedure: LAPAROSCOPIC VENTRAL HERNIA;  Surgeon: Adin Hector, MD;  Location: [place];  Service: General;  Laterality: N/A;  laparoscopic repair of incarcerated hernia   Family History  Problem Relation Age of Onset  . Breast cancer Mother   . Diverticulosis Father   . Prostate cancer Father     prostate  . Pulmonary embolism Brother     recurrent  . Breast cancer Sister     breast  . Heart attack Maternal Grandfather   . Stroke Neg Hx   . Hypertension Mother   . Diabetes Mother    History  Substance Use Topics  . Smoking status: Never Smoker   . Smokeless tobacco: Never Used  . Alcohol Use: 2.4 oz/week    4  Glasses of wine per week     Comment: 04/13/2012 "beer in the summer; wine 3-4 times/wk"   OB History    No data available     Review of Systems All other systems negative except as documented in the HPI. All pertinent positives and negatives as reviewed in the HPI.=   Allergies  Beclomethasone dipropionate; Budesonide-formoterol fumarate; Mometasone furo-formoterol fum; Sulfonamide derivatives; and Statins  Home Medications   Prior to Admission medications   Medication Sig Start Date End Date Taking? Authorizing Provider  ALPRAZolam Duanne Moron) 0.5 MG tablet Take 1 tablet (0.5 mg total) by mouth 2 (two) times daily as needed for anxiety (Palpatations, SOB). 07/29/14   Mosie Lukes, MD  aspirin 81 MG tablet Take 81  mg by mouth daily.     Historical Provider, MD  Cholecalciferol (VITAMIN D) 2000 UNITS CAPS Take 2,000 Units by mouth daily.     Historical Provider, MD  diphenhydrAMINE (SOMINEX) 25 MG tablet Take 25 mg by mouth at bedtime as needed for itching.    Historical Provider, MD  fexofenadine (ALLEGRA) 180 MG tablet Take 180 mg by mouth daily as needed for allergies.     Historical Provider, MD  furosemide (LASIX) 20 MG tablet Take 1 tablet (20 mg total) by mouth daily as needed. 11/17/14   Reyne Dumas, MD  HYDROcodone-homatropine (HYCODAN) 5-1.5 MG/5ML syrup Take 5 mLs by mouth every 6 (six) hours as needed for cough. 11/09/14   Reyne Dumas, MD  ibuprofen (ADVIL,MOTRIN) 200 MG tablet Take 200 mg by mouth every 6 (six) hours as needed for mild pain or moderate pain.    Historical Provider, MD  ipratropium-albuterol (DUONEB) 0.5-2.5 (3) MG/3ML SOLN Inhale 3 mLs into the lungs 4 (four) times daily as needed. Patient taking differently: Inhale 3 mLs into the lungs 4 (four) times daily as needed (wheezing or shortness of breath).  05/19/14   Brand Males, MD  mometasone (ASMANEX 60 METERED DOSES) 220 MCG/INH inhaler USE 2 INHALATIONS ORALLY EVERY EVENING 11/06/13   Brand Males, MD  montelukast (SINGULAIR) 10 MG tablet TAKE 1 TABLET BY MOUTH TWICE DAILY Patient taking differently: Take 10 mg by mouth. TAKE 1 TABLET BY MOUTH once or TWICE DAILY 11/06/13   Brand Males, MD  nitroGLYCERIN (NITROSTAT) 0.4 MG SL tablet Place 1 tablet (0.4 mg total) under the tongue every 5 (five) minutes as needed for chest pain. 11/18/14   Mosie Lukes, MD  ranitidine (ZANTAC) 300 MG tablet Take 1 tablet (300 mg total) by mouth at bedtime. 04/23/14   Mosie Lukes, MD  SPIRIVA HANDIHALER 18 MCG inhalation capsule INHALE THE CONTENTS OF ONE CAPSULE DAILY 11/21/14   Brand Males, MD  triamcinolone (NASACORT AQ) 55 MCG/ACT AERO nasal inhaler Place 2 sprays into the nose daily as needed (for allergies).     Historical  Provider, MD  venlafaxine XR (EFFEXOR-XR) 150 MG 24 hr capsule Take 150 mg by mouth daily with breakfast.    Historical Provider, MD   BP 107/68 mmHg  Pulse 69  Temp(Src) 98.5 F (36.9 C) (Oral)  Resp 16  Ht 5\' 2"  (1.575 m)  Wt 167 lb (75.751 kg)  BMI 30.54 kg/m2  SpO2 98% Physical Exam  Constitutional: She is oriented to person, place, and time. She appears well-developed and well-nourished. No distress.  HENT:  Head: Normocephalic and atraumatic.  Mouth/Throat: Oropharynx is clear and moist.  Eyes: Pupils are equal, round, and reactive to light.  Neck: Normal range of motion. Neck supple.  Cardiovascular: Normal rate, regular rhythm and normal heart sounds.  Exam reveals no gallop and no friction rub.   No murmur heard. Pulmonary/Chest: Effort normal and breath sounds normal. No respiratory distress.  Neurological: She is alert and oriented to person, place, and time. She exhibits normal muscle tone. Coordination normal.  Skin: Skin is warm and dry. No rash noted. No erythema.  Nursing note and vitals reviewed.   ED Course  Procedures (including critical care time) Labs Review Labs Reviewed  CBC - Abnormal; Notable for the following:    Hemoglobin 11.8 (*)    Platelets 638 (*)    All other components within normal limits  BASIC METABOLIC PANEL - Abnormal; Notable for the following:    Calcium 8.8 (*)    All other components within normal limits  TROPONIN I  TROPONIN I    Imaging Review Dg Chest 2 View  11/21/2014   CLINICAL DATA:  Chest pain and back pain.  EXAM: CHEST - 2 VIEW  COMPARISON:  One-view chest 11/17/2014. CTA of the chest 11/09/2014.  FINDINGS: The heart is enlarged. Bilateral effusions have improved. Persistent bibasilar airspace disease is noted, worse on the right. Emphysematous changes are again noted. Elevation of the posterior left hemidiaphragm is stable. A moderate hiatal hernia is noted.  IMPRESSION: 1. Improving aeration of both lung bases with  persistent bibasilar disease, right greater than left compatible with resolving pneumonia. 2. Improving bilateral pleural effusions.   Electronically Signed   By: San Morelle M.D.   On: 11/21/2014 15:31     EKG Interpretation   Date/Time:  Friday November 21 2014 14:17:34 EDT Ventricular Rate:  64 PR Interval:  152 QRS Duration: 82 QT Interval:  412 QTC Calculation: 425 R Axis:   52 Text Interpretation:  Normal sinus rhythm Normal ECG No significant change  since last tracing Confirmed by Canary Brim  MD, MARTHA [phone]) on 11/21/2014  2:30:25 PM      MDM   Final diagnoses:  None    Patient is feeling better and would like to be discharged home.  She is advised to return here as needed.  She is also advised to follow-up with her cardiologist as scheduled.  I talked to the on-call cardiologist, who reviewed her testing and EKGs and felt that she could be discharged home.  The patient was comfortable with this plan and all questions were answered  Dalia Heading, PA-C 11/25/14 [place], MD 11/26/14 [phone]

## 2014-11-21 NOTE — Progress Notes (Signed)
Victoria [name]  [id number] 04/18/1956 11/21/2014      Progress Note-Follow Up  Subjective  Chief Complaint  Chief Complaint  Patient presents with  . Follow-up    HPI  [name] in today for routine medical care. Patient with very complicated medical history and recent hospitalization for pneumonia and pleurisy. Has a cardiac cath scheduled for 11/25/2014 for symptoms and risk facts and recent cardiomegaly discovered and worsening fatigue, SOB. No fevers, wheezing, GI symptoms  Past Medical History  Diagnosis Date  . Asthma   . Obesity   . Maxillary sinusitis   . Osteoarthritis   . IBS (irritable bowel syndrome)   . Hyperlipidemia   . COPD (chronic obstructive pulmonary disease)   . Pulmonary nodules   . Osteoporosis   . Nasal congestion 9/12    current cold  . Cough   . Wheezing   . GERD (gastroesophageal reflux disease)   . H/O hiatal hernia   . Depression     mild  . Allergic bronchopulmonary aspergillosis 2008    sees Dr Edmund Hilda pulmonology  . Anemia     iron deficiency, resolved  . Normal cardiac stress test 11/2011  . OSA (obstructive sleep apnea) 02/2012    has stopped using  cpap  . Anginal pain   . Chronic bronchitis   . Exertional dyspnea     "sometimes lying down; always w/exertion" (04/13/2012)  . Headache(784.0)     "often but not daily" (04/13/2012)  . Anxiety   . Pneumonia 11/2011    "before 2013 I hadn't had pneumonia since I was a child" (04/13/2012)  . Schatzki's ring   . Pain in joint, ankle and foot 08/10/2014  . Palpitations 08/19/2008    Qualifier: Diagnosis of  By: Jerold Coombe    . Acute thoracic back pain 11/21/2014  . Cardiomegaly 11/21/2014    Past Surgical History  Procedure Laterality Date  . Cesarean section  1985  . Hernia repair  04/13/2012    VHR laparoscopic  . Appendectomy  1989  . Ventral hernia repair  04/13/2012    Procedure: LAPAROSCOPIC VENTRAL HERNIA;  Surgeon: Adin Hector, MD;   Location: [facility];  Service: General;  Laterality: N/A;  laparoscopic repair of incarcerated hernia    Family History  Problem Relation Age of Onset  . Breast cancer Mother   . Diverticulosis Father   . Prostate cancer Father     prostate  . Pulmonary embolism Brother     recurrent  . Breast cancer Sister     breast  . Heart attack Maternal Grandfather   . Stroke Neg Hx   . Hypertension Mother   . Diabetes Mother     History   Social History  . Marital Status: Married    Spouse Name: N/A  . Number of Children: 1  . Years of Education: N/A   Occupational History  .     Social History Main Topics  . Smoking status: Never Smoker   . Smokeless tobacco: Never Used  . Alcohol Use: 2.4 oz/week    4 Glasses of wine per week     Comment: 04/13/2012 "beer in the summer; wine 3-4 times/wk"  . Drug Use: No  . Sexual Activity: Yes     Comment: gluten free, lives with husband and son with CP quadriplegia   Other Topics Concern  . Not on file   Social History Narrative   Cares for a 10yo son  with cerebral palsy.     Current Outpatient Prescriptions on File Prior to Visit  Medication Sig Dispense Refill  . ALPRAZolam (XANAX) 0.5 MG tablet Take 1 tablet (0.5 mg total) by mouth 2 (two) times daily as needed for anxiety (Palpatations, SOB). 30 tablet 1  . aspirin 81 MG tablet Take 81 mg by mouth daily.     . Cholecalciferol (VITAMIN D) 2000 UNITS CAPS Take 2,000 Units by mouth daily.     . diphenhydrAMINE (SOMINEX) 25 MG tablet Take 25 mg by mouth at bedtime as needed for itching.    . fexofenadine (ALLEGRA) 180 MG tablet Take 180 mg by mouth daily as needed for allergies.     . furosemide (LASIX) 20 MG tablet Take 1 tablet (20 mg total) by mouth daily as needed. 30 tablet 1  . HYDROcodone-homatropine (HYCODAN) 5-1.5 MG/5ML syrup Take 5 mLs by mouth every 6 (six) hours as needed for cough. 240 mL 0  . ibuprofen (ADVIL,MOTRIN) 200 MG tablet Take 200 mg by mouth every 6 (six) hours  as needed for mild pain or moderate pain.    Marland Kitchen ipratropium-albuterol (DUONEB) 0.5-2.5 (3) MG/3ML SOLN Inhale 3 mLs into the lungs 4 (four) times daily as needed. (Patient taking differently: Inhale 3 mLs into the lungs 4 (four) times daily as needed (wheezing or shortness of breath). ) 360 mL 5  . mometasone (ASMANEX 60 METERED DOSES) 220 MCG/INH inhaler USE 2 INHALATIONS ORALLY EVERY EVENING 3 Inhaler 1  . montelukast (SINGULAIR) 10 MG tablet TAKE 1 TABLET BY MOUTH TWICE DAILY (Patient taking differently: Take 10 mg by mouth. TAKE 1 TABLET BY MOUTH once or TWICE DAILY) 180 tablet 3  . nitroGLYCERIN (NITROSTAT) 0.4 MG SL tablet Place 1 tablet (0.4 mg total) under the tongue every 5 (five) minutes as needed for chest pain. 25 tablet 0  . ranitidine (ZANTAC) 300 MG tablet Take 1 tablet (300 mg total) by mouth at bedtime. 90 tablet 1  . SPIRIVA HANDIHALER 18 MCG inhalation capsule INHALE THE CONTENTS OF ONE CAPSULE DAILY 90 capsule 1  . triamcinolone (NASACORT AQ) 55 MCG/ACT AERO nasal inhaler Place 2 sprays into the nose daily as needed (for allergies).     . venlafaxine XR (EFFEXOR-XR) 150 MG 24 hr capsule Take 150 mg by mouth daily with breakfast.     No current facility-administered medications on file prior to visit.    Allergies  Allergen Reactions  . Beclomethasone Dipropionate Hives and Other (See Comments)     weight gain  . Budesonide-Formoterol Fumarate Hives  . Mometasone Furo-Formoterol Fum Hives and Other (See Comments)    weight gain  . Sulfonamide Derivatives Hives and Rash  . Statins     Myalgias, RLS    Review of Systems  Review of Systems  Constitutional: Positive for malaise/fatigue. Negative for fever.  HENT: Negative for congestion.   Eyes: Negative for discharge.  Respiratory: Positive for shortness of breath.   Cardiovascular: Positive for chest pain and PND. Negative for palpitations and leg swelling.  Gastrointestinal: Negative for nausea, abdominal pain and  diarrhea.  Genitourinary: Negative for dysuria.  Musculoskeletal: Positive for back pain. Negative for falls.  Skin: Negative for rash.  Neurological: Negative for loss of consciousness and headaches.  Endo/Heme/Allergies: Negative for polydipsia.  Psychiatric/Behavioral: Negative for depression and suicidal ideas. The patient is nervous/anxious. The patient does not have insomnia.     Objective  BP 112/68 mmHg  Pulse 93  Temp(Src) 98.3 F (36.8 C) (Oral)  Wt 167 lb 2 oz (75.807 kg)  SpO2 97%  Physical Exam  Physical Exam  Constitutional: She is oriented to person, place, and time and well-developed, well-nourished, and in no distress. No distress.  HENT:  Head: Normocephalic and atraumatic.  Eyes: Conjunctivae are normal.  Neck: Neck supple. No thyromegaly present.  Cardiovascular: Normal rate, regular rhythm and normal heart sounds.   No murmur heard. Pulmonary/Chest: Effort normal and breath sounds normal. She has no wheezes.  Abdominal: She exhibits no distension and no mass.  Musculoskeletal: She exhibits no edema.  Lymphadenopathy:    She has no cervical adenopathy.  Neurological: She is alert and oriented to person, place, and time.  Skin: Skin is warm and dry. No rash noted. She is not diaphoretic.  Psychiatric: Memory, affect and judgment normal.    Lab Results  Component Value Date   TSH 1.85 09/03/2014   Lab Results  Component Value Date   WBC 13.6* 11/19/2014   HGB 12.9 11/19/2014   HCT 39.5 11/19/2014   MCV 89.4 11/19/2014   PLT 811.0* 11/19/2014   Lab Results  Component Value Date   CREATININE 0.64 11/19/2014   BUN 16 11/19/2014   NA 136 11/19/2014   K 4.4 11/19/2014   CL 101 11/19/2014   CO2 29 11/19/2014   Lab Results  Component Value Date   ALT 66* 11/10/2014   AST 45* 11/10/2014   ALKPHOS 167* 11/10/2014   BILITOT 0.3 11/10/2014   Lab Results  Component Value Date   CHOL 186 09/04/2012   Lab Results  Component Value Date   HDL  43.30 09/04/2012   Lab Results  Component Value Date   LDLCALC 125* 09/04/2012   Lab Results  Component Value Date   TRIG 90.0 09/04/2012   Lab Results  Component Value Date   CHOLHDL 4 09/04/2012     Assessment & Plan  Chest pain, atypical Thoracic back pain and right sided chest wall pain began last night and has been increasing. She is scheduled for a cardiac cath on 11/25/14 due ot recent health concerns and numerous risk factors. H/o hyperlipidemia, significant lung disease, recent worsening  SOB, palpitations and fatigue. Patient very anxious about waiting til next week for further work up due to her worsening pain which began last week. Was given SL NTG 0.4 mg tab at 1:44 with 2 81 mg aspirin with some resolution. Will send to ER for further consideration given her symptoms. EKG WNL   HLD (hyperlipidemia) Intolerant to statins   OSA (obstructive sleep apnea) Does not use CPAP   Asthma No wheezing on exam today follows with [facility] pulmonology   Esophageal reflux Patient denies any recent dyspepsia

## 2014-11-21 NOTE — Assessment & Plan Note (Signed)
Does not use CPAP. 

## 2014-11-21 NOTE — Discharge Instructions (Signed)
Return here as needed.  Follow-up with your scheduled appointment on Tuesday

## 2014-11-21 NOTE — Assessment & Plan Note (Signed)
No wheezing on exam today follows with [facility] pulmonology

## 2014-11-21 NOTE — Progress Notes (Signed)
Pre visit review using our clinic review tool, if applicable. No additional management support is needed unless otherwise documented below in the visit note. 

## 2014-11-21 NOTE — Assessment & Plan Note (Signed)
Intolerant to statins. 

## 2014-11-21 NOTE — Assessment & Plan Note (Addendum)
Thoracic back pain and right sided chest wall pain began last night and has been increasing. She is scheduled for a cardiac cath on 11/25/14 due ot recent health concerns and numerous risk factors. H/o hyperlipidemia, significant lung disease, recent worsening  SOB, palpitations and fatigue. Patient very anxious about waiting til next week for further work up due to her worsening pain which began last week. Was given SL NTG 0.4 mg tab at 1:44 with 2 81 mg aspirin with some resolution. Will send to ER for further consideration given her symptoms. EKG WNL

## 2014-11-22 ENCOUNTER — Encounter: Payer: Self-pay | Admitting: Internal Medicine

## 2014-11-25 ENCOUNTER — Ambulatory Visit (HOSPITAL_COMMUNITY)
Admission: RE | Admit: 2014-11-25 | Discharge: 2014-11-25 | Disposition: A | Discharge status: Home / Self Care | Payer: Managed Care, Other (non HMO) | Source: Ambulatory Visit | Attending: Interventional Cardiology | Admitting: Interventional Cardiology

## 2014-11-25 ENCOUNTER — Encounter (HOSPITAL_COMMUNITY)
Admission: RE | Disposition: A | Discharge status: Home / Self Care | Payer: Self-pay | Source: Ambulatory Visit | Attending: Interventional Cardiology

## 2014-11-25 DIAGNOSIS — K449 Diaphragmatic hernia without obstruction or gangrene: Secondary | ICD-10-CM | POA: Insufficient documentation

## 2014-11-25 DIAGNOSIS — Z7982 Long term (current) use of aspirin: Secondary | ICD-10-CM | POA: Diagnosis not present

## 2014-11-25 DIAGNOSIS — Z79899 Other long term (current) drug therapy: Secondary | ICD-10-CM | POA: Insufficient documentation

## 2014-11-25 DIAGNOSIS — E669 Obesity, unspecified: Secondary | ICD-10-CM | POA: Insufficient documentation

## 2014-11-25 DIAGNOSIS — F419 Anxiety disorder, unspecified: Secondary | ICD-10-CM | POA: Insufficient documentation

## 2014-11-25 DIAGNOSIS — J189 Pneumonia, unspecified organism: Secondary | ICD-10-CM | POA: Insufficient documentation

## 2014-11-25 DIAGNOSIS — Z888 Allergy status to other drugs, medicaments and biological substances status: Secondary | ICD-10-CM | POA: Diagnosis not present

## 2014-11-25 DIAGNOSIS — R072 Precordial pain: Secondary | ICD-10-CM | POA: Insufficient documentation

## 2014-11-25 DIAGNOSIS — K222 Esophageal obstruction: Secondary | ICD-10-CM | POA: Diagnosis not present

## 2014-11-25 DIAGNOSIS — K219 Gastro-esophageal reflux disease without esophagitis: Secondary | ICD-10-CM | POA: Insufficient documentation

## 2014-11-25 DIAGNOSIS — Z9049 Acquired absence of other specified parts of digestive tract: Secondary | ICD-10-CM | POA: Insufficient documentation

## 2014-11-25 DIAGNOSIS — J45909 Unspecified asthma, uncomplicated: Secondary | ICD-10-CM | POA: Insufficient documentation

## 2014-11-25 DIAGNOSIS — G4733 Obstructive sleep apnea (adult) (pediatric): Secondary | ICD-10-CM | POA: Insufficient documentation

## 2014-11-25 DIAGNOSIS — M810 Age-related osteoporosis without current pathological fracture: Secondary | ICD-10-CM | POA: Insufficient documentation

## 2014-11-25 DIAGNOSIS — I2510 Atherosclerotic heart disease of native coronary artery without angina pectoris: Secondary | ICD-10-CM | POA: Insufficient documentation

## 2014-11-25 DIAGNOSIS — Z882 Allergy status to sulfonamides status: Secondary | ICD-10-CM | POA: Insufficient documentation

## 2014-11-25 DIAGNOSIS — F329 Major depressive disorder, single episode, unspecified: Secondary | ICD-10-CM | POA: Insufficient documentation

## 2014-11-25 DIAGNOSIS — J449 Chronic obstructive pulmonary disease, unspecified: Secondary | ICD-10-CM | POA: Diagnosis not present

## 2014-11-25 DIAGNOSIS — I209 Angina pectoris, unspecified: Secondary | ICD-10-CM | POA: Diagnosis present

## 2014-11-25 DIAGNOSIS — Z6830 Body mass index (BMI) 30.0-30.9, adult: Secondary | ICD-10-CM | POA: Insufficient documentation

## 2014-11-25 DIAGNOSIS — M1990 Unspecified osteoarthritis, unspecified site: Secondary | ICD-10-CM | POA: Diagnosis not present

## 2014-11-25 DIAGNOSIS — I25119 Atherosclerotic heart disease of native coronary artery with unspecified angina pectoris: Secondary | ICD-10-CM | POA: Diagnosis not present

## 2014-11-25 HISTORY — PX: CARDIAC CATHETERIZATION: SHX172

## 2014-11-25 LAB — POCT I-STAT 3, VENOUS BLOOD GAS (G3P V)
Bicarbonate: 24.8 mEq/L — ABNORMAL HIGH (ref 20.0–24.0)
O2 Saturation: 68 %
PCO2 VEN: 42.1 mmHg — AB (ref 45.0–50.0)
TCO2: 26 mmol/L (ref 0–100)
pH, Ven: 7.379 — ABNORMAL HIGH (ref 7.250–7.300)
pO2, Ven: 36 mmHg (ref 30.0–45.0)

## 2014-11-25 SURGERY — RIGHT/LEFT HEART CATH AND CORONARY ANGIOGRAPHY
Anesthesia: LOCAL

## 2014-11-25 MED ORDER — ASPIRIN 81 MG PO CHEW
CHEWABLE_TABLET | ORAL | Status: AC
  Administered 2014-11-25: 81 mg via ORAL
  Filled 2014-11-25: qty 1

## 2014-11-25 MED ORDER — SODIUM CHLORIDE 0.9 % IJ SOLN
INTRAMUSCULAR | Status: DC | PRN
  Administered 2014-11-25: 14:00:00 via INTRA_ARTERIAL

## 2014-11-25 MED ORDER — LIDOCAINE HCL (PF) 1 % IJ SOLN
INTRAMUSCULAR | Status: AC
  Filled 2014-11-25: qty 30

## 2014-11-25 MED ORDER — IOHEXOL 350 MG/ML SOLN
INTRAVENOUS | Status: DC | PRN
  Administered 2014-11-25: 100 mL via INTRAVENOUS

## 2014-11-25 MED ORDER — SODIUM CHLORIDE 0.9 % IJ SOLN: 3.0000 mL | INTRAMUSCULAR | Status: DC | PRN

## 2014-11-25 MED ORDER — SODIUM CHLORIDE 0.9 % IV SOLN
INTRAVENOUS | Status: DC
  Administered 2014-11-25: 10:00:00 via INTRAVENOUS

## 2014-11-25 MED ORDER — HEPARIN SODIUM (PORCINE) 1000 UNIT/ML IJ SOLN
INTRAMUSCULAR | Status: AC
  Filled 2014-11-25: qty 1

## 2014-11-25 MED ORDER — ONDANSETRON HCL 4 MG/2ML IJ SOLN: 4.0000 mg | Freq: Four times a day (QID) | INTRAMUSCULAR | Status: DC | PRN

## 2014-11-25 MED ORDER — SODIUM CHLORIDE 0.9 % IJ SOLN: 3.0000 mL | Freq: Two times a day (BID) | INTRAMUSCULAR | Status: DC

## 2014-11-25 MED ORDER — SODIUM CHLORIDE 0.9 % WEIGHT BASED INFUSION: 1.0000 mL/kg/h | INTRAVENOUS | Status: DC

## 2014-11-25 MED ORDER — LIDOCAINE HCL (PF) 1 % IJ SOLN
INTRAMUSCULAR | Status: DC | PRN
  Administered 2014-11-25: 5 mL

## 2014-11-25 MED ORDER — HEPARIN (PORCINE) IN NACL 2-0.9 UNIT/ML-% IJ SOLN
INTRAMUSCULAR | Status: AC
  Filled 2014-11-25: qty 1500

## 2014-11-25 MED ORDER — SODIUM CHLORIDE 0.9 % IV SOLN: 250.0000 mL | INTRAVENOUS | Status: DC | PRN

## 2014-11-25 MED ORDER — FENTANYL CITRATE (PF) 100 MCG/2ML IJ SOLN
INTRAMUSCULAR | Status: DC | PRN
  Administered 2014-11-25 (×2): 50 ug via INTRAVENOUS

## 2014-11-25 MED ORDER — FENTANYL CITRATE (PF) 100 MCG/2ML IJ SOLN
INTRAMUSCULAR | Status: AC
  Filled 2014-11-25: qty 2

## 2014-11-25 MED ORDER — ASPIRIN 81 MG PO CHEW
81.0000 mg | CHEWABLE_TABLET | ORAL | Status: AC
  Administered 2014-11-25: 81 mg via ORAL

## 2014-11-25 MED ORDER — MIDAZOLAM HCL 2 MG/2ML IJ SOLN
INTRAMUSCULAR | Status: AC
  Filled 2014-11-25: qty 2

## 2014-11-25 MED ORDER — MIDAZOLAM HCL 2 MG/2ML IJ SOLN
INTRAMUSCULAR | Status: DC | PRN
  Administered 2014-11-25 (×2): 1 mg via INTRAVENOUS

## 2014-11-25 MED ORDER — HEPARIN SODIUM (PORCINE) 1000 UNIT/ML IJ SOLN
INTRAMUSCULAR | Status: DC | PRN
  Administered 2014-11-25: 4000 [IU] via INTRAVENOUS

## 2014-11-25 MED ORDER — ACETAMINOPHEN 325 MG PO TABS: 650.0000 mg | ORAL_TABLET | ORAL | Status: DC | PRN

## 2014-11-25 MED ORDER — OXYCODONE-ACETAMINOPHEN 5-325 MG PO TABS: 1.0000 | ORAL_TABLET | ORAL | Status: DC | PRN

## 2014-11-25 SURGICAL SUPPLY — 12 items
CATH BALLN WEDGE 5F 110CM (CATHETERS) ×1 IMPLANT
CATH INFINITI 5 FR JL3.5 (CATHETERS) ×2 IMPLANT
CATH INFINITI JR4 5F (CATHETERS) ×2 IMPLANT
DEVICE RAD COMP TR BAND LRG (VASCULAR PRODUCTS) ×2 IMPLANT
GLIDESHEATH SLEND A-KIT 6F 22G (SHEATH) ×2 IMPLANT
GUIDEWIRE .025 260CM (WIRE) ×1 IMPLANT
KIT HEART LEFT (KITS) ×4 IMPLANT
PACK CARDIAC CATHETERIZATION (CUSTOM PROCEDURE TRAY) ×2 IMPLANT
SHEATH FAST CATH BRACH 5F 5CM (SHEATH) ×1 IMPLANT
TRANSDUCER W/STOPCOCK (MISCELLANEOUS) ×2 IMPLANT
TUBING CIL FLEX 10 FLL-RA (TUBING) ×2 IMPLANT
WIRE SAFE-T 1.5MM-J .035X260CM (WIRE) ×2 IMPLANT

## 2014-11-25 NOTE — Interval H&P Note (Signed)
Cath Lab Visit (complete for each Cath Lab visit)  Clinical Evaluation Leading to the Procedure:   ACS: No.  Non-ACS:    Anginal Classification: CCS III  Anti-ischemic medical therapy: Minimal Therapy (1 class of medications)  Non-Invasive Test Results: No non-invasive testing performed  Prior CABG: No previous CABG      History and Physical Interval Note:  11/25/2014 1:02 PM  Victoria [name]  has presented today for surgery, with the diagnosis of SOB, cp  The various methods of treatment have been discussed with the patient and family. After consideration of risks, benefits and other options for treatment, the patient has consented to  Procedure(s): Right/Left Heart Cath and Coronary Angiography (N/A) as a surgical intervention .  The patient's history has been reviewed, patient examined, no change in status, stable for surgery.  I have reviewed the patient's chart and labs.  Questions were answered to the patient's satisfaction.     Sinclair Grooms

## 2014-11-25 NOTE — Progress Notes (Signed)
TR BAND REMOVAL  LOCATION:    right radial  DEFLATED PER PROTOCOL:    Yes.    TIME BAND OFF / DRESSING APPLIED:    1700   SITE UPON ARRIVAL:    Level 0  SITE AFTER BAND REMOVAL:    Level 0  CIRCULATION SENSATION AND MOVEMENT:    Within Normal Limits   Yes.    COMMENTS:   TRB REMOVED/ TEGADERM DSG APPLED

## 2014-11-25 NOTE — H&P (View-Only) (Signed)
Cardiology Office Note   Date:  11/19/2014   ID:  Heiress [name], DOB [birth year], MRN [id number]  PCP:  [name], MD  Cardiologist:  Dr. Ena Dawley     Chief Complaint  Patient presents with  . Chest Pain     History of Present Illness: Victoria [name] is a 59 y.o. female with a hx of severe persistent asthma, HL, obesity.  She had a normal stress test in 2013.  She was evaluated by Dr. Ena Dawley 3/15 for chest pain.  Cardiac CTA was recommended.  Her insurance denied paying for this test.  Stress echo was arranged but the patient declined to schedule it.    Admitted 5/21-5/22 with bilateral lower lobe community acquired pneumonia.  She was readmitted within 24 hours for R lateral and inferior chest pain.  CTA was neg for PE.  Troponins were negative.  CXR showed worsening bilateral PNA.  She returned to the ED with chest pain on 5/30.  CEs were neg.  She returns for Cardiology FU.     She tells me that she has been having severe fatigue and worse DOE for the last 6 mos.  She has not felt like this is related to her asthma.  She has seen her primary care physician as well as her pulmonologist several times. She has also been to the emergency room several times. She has had her thyroid tests checked. She has had follow-up pulmonary tests. Symptoms worsened with associated fever cough several weeks ago. That's when she was diagnosed with bilateral lower lobe pneumonia. Presenting symptoms did improve with antibodies. However, she has continued to experience fatigue and dyspnea. She notes decreased exercise tolerance with elevated heart rate and blood pressure, palpitations with any type of activity. She has chest pain frequently. She cannot really reproduce it with exertion. However, she has significant symptoms at rest. Chest discomfort has awoken her from sleep at times. She describes substernal pressure. She has felt it radiating up into her jaw. She has noted associated nausea,  diaphoresis and dyspnea. She denies syncope. When she first came home from the hospital with pneumonia, she had to sleep in a recliner. Since then she denies orthopnea. She does have occasional LE edema. She takes Lasix as needed. She is not certain if Lasix improved her breathing. She summoned EMS earlier this week for severe chest pain. She was given nitroglycerin by the EMT with prompt relief.   Studies/Reports Reviewed Today:  Chest CTA 11/09/14 IMPRESSION: 1. No evidence of pulmonary embolism. 2. Findings again worrisome for right lower lobe pneumonia. A follow-up chest radiograph in 3-4 weeks after treatment is recommended to ensure resolution. 3. Punctate approximately 6 mm nodule within the right lower lobe is grossly unchanged since the 11/2011 examination and thus of benign etiology - evaluation for additional discrete pulmonary nodules is degraded secondary to concomitant superimposed acute infectious process within the right lower lobe. 4. Unchanged biapical pleural parenchymal thickening. 5. Unchanged moderate-sized [place] and hiatal hernias.  Myoview 12/14/11 IMPRESSION: No evidence of ischemia or infarct. Calculated ejection fraction 72%.  Past Medical History  Diagnosis Date  . Asthma   . Obesity   . Maxillary sinusitis   . Osteoarthritis   . IBS (irritable bowel syndrome)   . Hyperlipidemia   . COPD (chronic obstructive pulmonary disease)   . Pulmonary nodules   . Osteoporosis   . Nasal congestion 9/12    current cold  . Cough   . Wheezing   . GERD (  gastroesophageal reflux disease)   . H/O hiatal hernia   . Depression     mild  . Allergic bronchopulmonary aspergillosis 2008    sees Dr Edmund Hilda pulmonology  . Anemia     iron deficiency, resolved  . Normal cardiac stress test 11/2011  . OSA (obstructive sleep apnea) 02/2012    has stopped using  cpap  . Anginal pain   . Chronic bronchitis   . Exertional dyspnea     "sometimes lying down;  always w/exertion" (04/13/2012)  . Headache(784.0)     "often but not daily" (04/13/2012)  . Anxiety   . Pneumonia 11/2011    "before 2013 I hadn't had pneumonia since I was a child" (04/13/2012)  . Schatzki's ring   . Pain in joint, ankle and foot 08/10/2014  . Palpitations 08/19/2008    Qualifier: Diagnosis of  By: Jerold Coombe      Past Surgical History  Procedure Laterality Date  . Cesarean section  1985  . Hernia repair  04/13/2012    VHR laparoscopic  . Appendectomy  1989  . Ventral hernia repair  04/13/2012    Procedure: LAPAROSCOPIC VENTRAL HERNIA;  Surgeon: Adin Hector, MD;  Location: [facility];  Service: General;  Laterality: N/A;  laparoscopic repair of incarcerated hernia     Current Outpatient Prescriptions  Medication Sig Dispense Refill  . ALPRAZolam (XANAX) 0.5 MG tablet Take 1 tablet (0.5 mg total) by mouth 2 (two) times daily as needed for anxiety (Palpatations, SOB). 30 tablet 1  . aspirin 81 MG tablet Take 81 mg by mouth daily.     . Cholecalciferol (VITAMIN D) 2000 UNITS CAPS Take 2,000 Units by mouth daily.     . diphenhydrAMINE (SOMINEX) 25 MG tablet Take 25 mg by mouth at bedtime as needed for itching.    . fexofenadine (ALLEGRA) 180 MG tablet Take 180 mg by mouth daily as needed for allergies.     . furosemide (LASIX) 20 MG tablet Take 1 tablet (20 mg total) by mouth daily as needed. 30 tablet 1  . HYDROcodone-homatropine (HYCODAN) 5-1.5 MG/5ML syrup Take 5 mLs by mouth every 6 (six) hours as needed for cough. 240 mL 0  . ibuprofen (ADVIL,MOTRIN) 200 MG tablet Take 200 mg by mouth every 6 (six) hours as needed for mild pain or moderate pain.    Marland Kitchen ipratropium-albuterol (DUONEB) 0.5-2.5 (3) MG/3ML SOLN Inhale 3 mLs into the lungs 4 (four) times daily as needed. (Patient taking differently: Inhale 3 mLs into the lungs 4 (four) times daily as needed (wheezing or shortness of breath). ) 360 mL 5  . mometasone (ASMANEX 60 METERED DOSES) 220 MCG/INH inhaler USE 2  INHALATIONS ORALLY EVERY EVENING 3 Inhaler 1  . montelukast (SINGULAIR) 10 MG tablet TAKE 1 TABLET BY MOUTH TWICE DAILY (Patient taking differently: Take 10 mg by mouth. TAKE 1 TABLET BY MOUTH once or TWICE DAILY) 180 tablet 3  . nitroGLYCERIN (NITROSTAT) 0.4 MG SL tablet Place 1 tablet (0.4 mg total) under the tongue every 5 (five) minutes as needed for chest pain. 25 tablet 0  . predniSONE (DELTASONE) 10 MG tablet Take 4 tabs for 2 days, then 3 tabs for 2 days, 2 tabs for 2 days, then 1 tab for 2 days, then stop. 20 tablet 0  . ranitidine (ZANTAC) 300 MG tablet Take 1 tablet (300 mg total) by mouth at bedtime. 90 tablet 1  . tiotropium (SPIRIVA HANDIHALER) 18 MCG inhalation capsule INHALE CONTENTS OF 1  CAPSULE THROUGH HANDIHALER DEVICE EVERY DAY 90 capsule 3  . triamcinolone (NASACORT AQ) 55 MCG/ACT AERO nasal inhaler Place 2 sprays into the nose daily as needed (for allergies).     . venlafaxine XR (EFFEXOR-XR) 150 MG 24 hr capsule Take 150 mg by mouth daily with breakfast.     No current facility-administered medications for this visit.    Allergies:   Beclomethasone dipropionate; Budesonide-formoterol fumarate; Mometasone furo-formoterol fum; Sulfonamide derivatives; and Statins    Social History:  The patient  reports that she has never smoked. She has never used smokeless tobacco. She reports that she drinks about 2.4 oz of alcohol per week. She reports that she does not use illicit drugs.   Family History:  The patient's family history includes Breast cancer in her mother and sister; Diabetes in her mother; Diverticulosis in her father; Heart attack in her maternal grandfather; Hypertension in her mother; Prostate cancer in her father; Pulmonary embolism in her brother. There is no history of Stroke.    ROS:   Please see the history of present illness.   Review of Systems  Constitution: Positive for chills, malaise/fatigue and weight gain.  Cardiovascular: Positive for chest pain,  dyspnea on exertion, leg swelling and orthopnea.  Respiratory: Positive for cough, snoring and wheezing.   Psychiatric/Behavioral: Positive for depression.  All other systems reviewed and are negative.    PHYSICAL EXAM: VS:  BP 110/70 mmHg  Pulse 74  Ht 5\' 2"  (1.575 m)  Wt 168 lb (76.204 kg)  BMI 30.72 kg/m2    Wt Readings from Last 3 Encounters:  11/19/14 168 lb (76.204 kg)  11/17/14 170 lb (77.111 kg)  11/13/14 174 lb 3.2 oz (79.017 kg)     GEN: Well nourished, well developed, in no acute distress HEENT: normal Neck: no JVD, no carotid bruits, no masses Cardiac:  Normal T4/H9, RRR; 1/6 systolic murmur RUSB,  no rubs or gallops, no edema   Respiratory:  clear to auscultation bilaterally, no wheezing, rhonchi or rales. GI: soft, nontender, nondistended, + BS MS: no deformity or atrophy Skin: warm and dry  Neuro:  CNs II-XII intact, Strength and sensation are intact Psych: Normal affect   EKG:  EKG is ordered today.  It demonstrates:   NSR, HR 74, normal axis, no ST changes   Recent Labs: 09/03/2014: TSH 1.85 11/10/2014: ALT 66* 11/17/2014: B Natriuretic Peptide 20.8 11/19/2014: BUN 16; Creatinine 0.64; Hemoglobin 12.9; Platelets 811.0*; Potassium 4.4; Sodium 136    Lipid Panel    Component Value Date/Time   CHOL 186 09/04/2012 1425   TRIG 90.0 09/04/2012 1425   HDL 43.30 09/04/2012 1425   CHOLHDL 4 09/04/2012 1425   VLDL 18.0 09/04/2012 1425   LDLCALC 125* 09/04/2012 1425   LDLDIRECT 177.8 09/07/2011 0945      ASSESSMENT AND PLAN:  Precordial chest pain:  The patient presents with 6 months of progressively worsening fatigue and dyspnea with exertion. Symptoms seem to be out of proportion to her asthma. She did have a recent admission to the hospital for pneumonia. Symptoms related to pneumonia did improve. However, she continues to have significant dyspnea with exertion and fatigue. She's also had severe chest pain which has some symptoms concerning for angina.  Chest discomfort is nitroglycerin responsive. She is quite concerned about her symptoms. We discussed the possibility of proceeding with stress testing versus definitive evaluation with cardiac catheterization. She prefers a definitive evaluation and actually requests cardiac catheterization today. I reviewed this with Dr. Ron Parker (DOD).  He agreed with proceeding with cardiac catheterization.  Risks and benefits of cardiac catheterization have been discussed with the patient.  These include bleeding, infection, kidney damage, stroke, heart attack, death.  The patient understands these risks and is willing to proceed. Continue aspirin. Hold off on beta blocker given her history of asthma. She has when necessary nitroglycerin already.  I will also obtain a 2-D Echocardiogram to assess systolic and diastolic function, valves and R heart pressures.  HLD (hyperlipidemia): She is intolerant to statins. If significant CAD found, consider referral to the lipid clinic for PCSK-9 therapy.  Asthma, severe persistent, uncomplicated: Follow-up with pulmonology as planned.   Current medicines are reviewed at length with the patient today.  Concerns regarding medicines are as outlined above.  The following changes have been made:    None    Labs/ tests ordered today include:  Orders Placed This Encounter  Procedures  . Basic Metabolic Panel (BMET)  . CBC w/Diff  . INR/PT  . EKG 12-Lead  . Echocardiogram    Disposition:   FU with Dr. Ena Dawley after her cardiac cath.    Signed, Versie Starks, MHS 11/19/2014 4:29 PM    [facility] [place], [place], [place]  [address] Phone: [phone]; Fax: [phone]

## 2014-11-25 NOTE — Discharge Instructions (Signed)
Radial Site Care °Refer to this sheet in the next few weeks. These instructions provide you with information on caring for yourself after your procedure. Your caregiver may also give you more specific instructions. Your treatment has been planned according to current medical practices, but problems sometimes occur. Call your caregiver if you have any problems or questions after your procedure. °HOME CARE INSTRUCTIONS °· You may shower the day after the procedure. Remove the bandage (dressing) and gently wash the site with plain soap and water. Gently pat the site dry. °· Do not apply powder or lotion to the site. °· Do not submerge the affected site in water for 3 to 5 days. °· Inspect the site at least twice daily. °· Do not flex or bend the affected arm for 24 hours. °· No lifting over 5 pounds (2.3 kg) for 5 days after your procedure. °· Do not drive home if you are discharged the same day of the procedure. Have someone else drive you. °· You may drive 24 hours after the procedure unless otherwise instructed by your caregiver. °· Do not operate machinery or power tools for 24 hours. °· A responsible adult should be with you for the first 24 hours after you arrive home. °What to expect: °· Any bruising will usually fade within 1 to 2 weeks. °· Blood that collects in the tissue (hematoma) may be painful to the touch. It should usually decrease in size and tenderness within 1 to 2 weeks. °SEEK IMMEDIATE MEDICAL CARE IF: °· You have unusual pain at the radial site. °· You have redness, warmth, swelling, or pain at the radial site. °· You have drainage (other than a small amount of blood on the dressing). °· You have chills. °· You have a fever or persistent symptoms for more than 72 hours. °· You have a fever and your symptoms suddenly get worse. °· Your arm becomes pale, cool, tingly, or numb. °· You have heavy bleeding from the site. Hold pressure on the site. °Document Released: 07/09/2010 Document Revised:  08/29/2011 Document Reviewed: 07/09/2010 °ExitCare® Patient Information ©2015 [employer]. This information is not intended to replace advice given to you by your health care provider. Make sure you discuss any questions you have with your health care provider. ° °

## 2014-11-26 ENCOUNTER — Encounter (HOSPITAL_COMMUNITY): Payer: Self-pay | Admitting: Interventional Cardiology

## 2014-11-26 LAB — POCT I-STAT 3, ART BLOOD GAS (G3+)
Acid-base deficit: 3 mmol/L — ABNORMAL HIGH (ref 0.0–2.0)
Bicarbonate: 22.5 mEq/L (ref 20.0–24.0)
O2 SAT: 93 %
PO2 ART: 69 mmHg — AB (ref 80.0–100.0)
TCO2: 24 mmol/L (ref 0–100)
pCO2 arterial: 39 mmHg (ref 35.0–45.0)
pH, Arterial: 7.369 (ref 7.350–7.450)

## 2014-11-28 ENCOUNTER — Other Ambulatory Visit: Payer: Self-pay

## 2014-11-28 ENCOUNTER — Other Ambulatory Visit (INDEPENDENT_AMBULATORY_CARE_PROVIDER_SITE_OTHER): Payer: Managed Care, Other (non HMO) | Admitting: *Deleted

## 2014-11-28 ENCOUNTER — Ambulatory Visit (HOSPITAL_COMMUNITY): Payer: Managed Care, Other (non HMO) | Attending: Internal Medicine

## 2014-11-28 ENCOUNTER — Encounter: Payer: Self-pay | Admitting: Physician Assistant

## 2014-11-28 ENCOUNTER — Telehealth: Payer: Self-pay | Admitting: *Deleted

## 2014-11-28 DIAGNOSIS — I517 Cardiomegaly: Secondary | ICD-10-CM | POA: Insufficient documentation

## 2014-11-28 DIAGNOSIS — R0602 Shortness of breath: Secondary | ICD-10-CM

## 2014-11-28 DIAGNOSIS — I340 Nonrheumatic mitral (valve) insufficiency: Secondary | ICD-10-CM | POA: Insufficient documentation

## 2014-11-28 DIAGNOSIS — R5383 Other fatigue: Secondary | ICD-10-CM | POA: Diagnosis not present

## 2014-11-28 DIAGNOSIS — R072 Precordial pain: Secondary | ICD-10-CM

## 2014-11-28 DIAGNOSIS — E785 Hyperlipidemia, unspecified: Secondary | ICD-10-CM | POA: Diagnosis not present

## 2014-11-28 LAB — HEPATIC FUNCTION PANEL
ALT: 19 U/L (ref 0–35)
AST: 15 U/L (ref 0–37)
Albumin: 3.8 g/dL (ref 3.5–5.2)
Alkaline Phosphatase: 106 U/L (ref 39–117)
Bilirubin, Direct: 0 mg/dL (ref 0.0–0.3)
TOTAL PROTEIN: 6.6 g/dL (ref 6.0–8.3)
Total Bilirubin: 0.3 mg/dL (ref 0.2–1.2)

## 2014-11-28 LAB — CBC WITH DIFFERENTIAL/PLATELET
Basophils Absolute: 0 10*3/uL (ref 0.0–0.1)
Basophils Relative: 0.7 % (ref 0.0–3.0)
EOS ABS: 0.6 10*3/uL (ref 0.0–0.7)
EOS PCT: 8.4 % — AB (ref 0.0–5.0)
HEMATOCRIT: 37.1 % (ref 36.0–46.0)
HEMOGLOBIN: 12.1 g/dL (ref 12.0–15.0)
LYMPHS ABS: 1.4 10*3/uL (ref 0.7–4.0)
Lymphocytes Relative: 20.2 % (ref 12.0–46.0)
MCHC: 32.8 g/dL (ref 30.0–36.0)
MCV: 89 fl (ref 78.0–100.0)
MONO ABS: 0.4 10*3/uL (ref 0.1–1.0)
Monocytes Relative: 5.6 % (ref 3.0–12.0)
Neutro Abs: 4.5 10*3/uL (ref 1.4–7.7)
Neutrophils Relative %: 65.1 % (ref 43.0–77.0)
Platelets: 371 10*3/uL (ref 150.0–400.0)
RBC: 4.17 Mil/uL (ref 3.87–5.11)
RDW: 15.3 % (ref 11.5–15.5)
WBC: 6.9 10*3/uL (ref 4.0–10.5)

## 2014-11-28 LAB — LIPID PANEL
Cholesterol: 239 mg/dL — ABNORMAL HIGH (ref 0–200)
HDL: 45.1 mg/dL (ref >39.00)
LDL Cholesterol: 170 mg/dL — ABNORMAL HIGH (ref 0–99)
NonHDL: 193.9
TRIGLYCERIDES: 118 mg/dL (ref 0.0–149.0)
Total CHOL/HDL Ratio: 5
VLDL: 23.6 mg/dL (ref 0.0–40.0)

## 2014-11-28 NOTE — Telephone Encounter (Signed)
Pt notified of lab and echo results with verbal understanding to both results by phone. Pt agreeable to see Lipid Clinic to consider PCSK-9 inhibitor. I will have [facility] call her next week with an appt to the lipid clinic .

## 2014-11-28 NOTE — Addendum Note (Signed)
Addended by: Eulis Foster on: 11/28/2014 09:30 AM   Modules accepted: Orders

## 2014-12-04 ENCOUNTER — Ambulatory Visit (INDEPENDENT_AMBULATORY_CARE_PROVIDER_SITE_OTHER)
Admission: RE | Admit: 2014-12-04 | Discharge: 2014-12-04 | Disposition: A | Discharge status: Home / Self Care | Payer: Managed Care, Other (non HMO) | Source: Ambulatory Visit | Attending: Adult Health | Admitting: Adult Health

## 2014-12-04 ENCOUNTER — Ambulatory Visit (INDEPENDENT_AMBULATORY_CARE_PROVIDER_SITE_OTHER): Payer: Managed Care, Other (non HMO) | Admitting: Adult Health

## 2014-12-04 ENCOUNTER — Encounter: Payer: Self-pay | Admitting: Adult Health

## 2014-12-04 VITALS — BP 118/72 | HR 92 | Temp 98.2°F | Ht 62.0 in | Wt 168.0 lb

## 2014-12-04 DIAGNOSIS — B4481 Allergic bronchopulmonary aspergillosis: Secondary | ICD-10-CM | POA: Diagnosis not present

## 2014-12-04 DIAGNOSIS — J189 Pneumonia, unspecified organism: Secondary | ICD-10-CM

## 2014-12-04 DIAGNOSIS — I517 Cardiomegaly: Secondary | ICD-10-CM | POA: Diagnosis not present

## 2014-12-04 DIAGNOSIS — J4551 Severe persistent asthma with (acute) exacerbation: Secondary | ICD-10-CM

## 2014-12-04 NOTE — Patient Instructions (Signed)
Continue on current regimen  Chest xray today  Follow up with Dr. Chase Caller in 6 weeks and As needed

## 2014-12-04 NOTE — Assessment & Plan Note (Signed)
Questionable flare appears to be improved after steroid taper.  Chest x-ray today

## 2014-12-04 NOTE — Progress Notes (Signed)
Subjective:    Patient ID: Victoria [name], female    DOB: 1956-02-18, 59 y.o.   MRN: [id number]  HPI 59  y.o. female with mild OSA, severe persistent asthma and ABPA   11/13/14  Post hospital follow up  Pt returns for post hospital follow up .  Admitted x 2 recently for CAP. Found to have bilateral on CXR , tx w/ abx and discharged  Went home briefly but developed chest pain /pleuritic pain with worsening dyspnea.  Went back to hospital , CT chest was neg for PE , showed RLL PNA .  Cardiac enzymes were mildly elevated with repeat neg. Lactate and procalcitonin nml.  She was treated with IV abx and discharged on zithromax and cefdinir.  Since discharge she remains very weak and has sign cough and pleuritic pain on right.  No fever, hemoptyisis, calf pain, orthopnea, or exertional chest pain.  Taking percocet for pain, which helps.   12/04/2014 Follow up :  Mild OSA, severe persistent asthma and ABPA  Patient returns for a two-week follow-up. Recently admitted for slow to resolve pneumonia on the right. Treated with anabiotic's and discharged on Zithromax and Ceftin ear. He was seen 3 weeks ago. Shortly after discharge. Chest x-ray showed persistent bilateral pneumonia with a right effusion. Patient was instructed to finish her anabiotic's and given a prednisone taper. Patient continued to have pleuritic pain. She went to the emergency room and was referred to cardiology 2-D echo showed an EF of 60-65% with moderate LVH and grade 1 diastolic dysfunction. She underwent a right and left heart catheter showed nonobstructive coronary disease - 25-30% narrowing in the RCA  and 60-70% stenosis in the first obtuse marginal with no indication for  PCI. Per cardiology. Normal pulmonary artery pressures  She was recommended for 81 mg of aspirin daily.   Patient says since last visit. She is feeling improved and feels that she is near her baseline. Cough is near resolved. He has no fever,  orthopnea, PND, leg swelling, discolored mucus. She is finished all her anabiotic's. Pleuritic chest wall pain has resolved. Chest x-ray is pending today. She denies any home hemoptysis. Does complain of intermittent reflux. We discussed follow-up with her GI specialist as she is already on ppi therapy.    Review of Systems Constitutional:   No  weight loss, night sweats,  Fevers, chills +, fatigue, or  lassitude.  HEENT:   No headaches,  Difficulty swallowing,  Tooth/dental problems, or  Sore throat,                No sneezing, itching, ear ache, nasal congestion, post nasal drip,   CV:  No chest pain,  Orthopnea, PND, swelling in lower extremities, anasarca, dizziness, palpitations, syncope.   GI  No heartburn, indigestion, abdominal pain, nausea, vomiting, diarrhea, change in bowel habits, loss of appetite, bloody stools.   Resp:    No chest wall deformity  Skin: no rash or lesions.  GU: no dysuria, change in color of urine, no urgency or frequency.  No flank pain, no hematuria   MS:  No joint pain or swelling.  No decreased range of motion.  No back pain.  Psych:  No change in mood or affect. No depression or anxiety.  No memory loss.         Objective:   Physical Exam GEN: A/Ox3; pleasant , NAD, elderly   HEENT:  [place]/AT,  EACs-clear, TMs-wnl, NOSE-clear, THROAT-clear, no lesions, no postnasal drip or exudate noted.  NECK:  Supple w/ fair ROM; no JVD; normal carotid impulses w/o bruits; no thyromegaly or nodules palpated; no lymphadenopathy.  RESP  Decreased BS  w/o, wheezes/ rales/ or rhonchi.no accessory muscle use, no dullness to percussion  CARD:  RRR, no m/r/g  , no peripheral edema, pulses intact, no cyanosis or clubbing.  GI:   Soft & nt; nml bowel sounds; no organomegaly or masses detected.  Musco: Warm bil, no deformities or joint swelling noted.   Neuro: alert, no focal deficits noted.    Skin: Warm, no lesions or rashes         Assessment &  Plan:

## 2014-12-04 NOTE — Assessment & Plan Note (Signed)
Bilateral pneumonia, now finished anabiotic's  Clinically, patient is improved Chest x-ray today. Follow-up 6 weeks with Dr. Chase Caller

## 2014-12-04 NOTE — Assessment & Plan Note (Signed)
Recent exacerbation, now resolving She is continue on current regimen

## 2014-12-05 ENCOUNTER — Encounter: Payer: Self-pay | Admitting: Family Medicine

## 2014-12-05 NOTE — Progress Notes (Signed)
Quick Note:  Called and spoke with pt. Reviewed results and recs. Pt voiced understanding and had no further questions. ______ 

## 2014-12-08 ENCOUNTER — Other Ambulatory Visit: Payer: Self-pay | Admitting: Family Medicine

## 2014-12-08 DIAGNOSIS — R5383 Other fatigue: Secondary | ICD-10-CM

## 2014-12-08 NOTE — Telephone Encounter (Signed)
Vitamin B12 lab ordered.

## 2014-12-10 NOTE — Progress Notes (Signed)
Cardiology Office Note   Date:  12/11/2014   ID:  Victoria [name], DOB 04/19/1956, MRN [id number]  PCP:  [name], MD  Cardiologist:  Dr. Ena Dawley     Chief Complaint  Patient presents with  . Coronary Artery Disease     History of Present Illness: Victoria [name] is a 59 y.o. female with a hx of severe persistent asthma, HL, obesity.  She had a normal stress test in 2013.  She was evaluated by Dr. Ena Dawley 3/15 for chest pain.  Cardiac CTA was recommended.  Her insurance denied paying for this test.  Stress echo was arranged but the patient declined to schedule it.    Admitted 10/2014 with bilateral lower lobe community acquired pneumonia.  She was readmitted within 24 hours for R lateral and inferior chest pain.  CTA was neg for PE.  Troponins were negative.  CXR showed worsening bilateral PNA.  She returned to the ED with chest pain on 5/30.  CEs were neg.    I saw her 11/19/14 with complaints of severe fatigue and worse DOE for the last 6 mos.  She had seen her pulmonologist several times and also had been to the emergency room several times. She complained of decreased exercise tolerance with elevated heart rate and blood pressure, palpitations with any type of activity. She c/o chest pain frequently. She noted relief with NTG in the past.  We set her up for LHC and an Echo.  Cardiac cath demonstrated mod non-obstructive CAD with ostial OM1 60%. This was not felt to be severe enough to proceed with PCI.  Vasospasm could not be excluded.  PRN Nitro was recommended.    She returns for FU.  She denies any further chest symptoms. Her breathing is improving. She denies orthopnea, PND. She does note dependent LE edema. She denies syncope.   Studies/Reports Reviewed Today:  Echo 11/28/14 - Moderateconcentric hypertrophy. EF 60% to 65%. Wallmotion was normal; Grade 1 diastolic dysfunction - Mitral valve: There was trivial regurgitation. - Left atrium: The atrium was normal  in size. Impressions:- LVEF 60-65%, modearate LVH, diastolic dysfunction, indeterminateLV filling pressure, normal LA size.  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 There is ostial/proximal 25-30% narrowing but not the classic slitlike appearance.  60-70% stenosis in the ostium of the first obtuse marginal.  Otherwise normal coronary arteries.  Normal left ventricular function.  Normal pulmonary artery pressures RECOMMENDATIONS:  Continue to use sublingual nitroglycerin as needed for relief of discomfort.  Cannot exclude the possibility of coronary artery spasm.  There was not an indication for PCI on the obtuse marginal as it did not appear to be a hemodynamically significant stenosis.  Alternative explanations for the patient's chest discomfort including GI, musculoskeletal, or other etiology should be considered.  Risk factor modification including 81 mg of aspirin daily  Chest CTA 11/09/14 IMPRESSION: 1. No evidence of pulmonary embolism. 2. Findings again worrisome for right lower lobe pneumonia. A follow-up chest radiograph in 3-4 weeks after treatment is recommended to ensure resolution. 3. Punctate approximately 6 mm nodule within the right lower lobe is grossly unchanged since the 11/2011 examination and thus of benign etiology - evaluation for additional discrete pulmonary nodules is degraded secondary to concomitant superimposed acute infectious process within the right lower lobe. 4. Unchanged biapical pleural parenchymal thickening. 5. Unchanged moderate-sized [place] and hiatal hernias.  Myoview 12/14/11 IMPRESSION: No  evidence of ischemia or infarct. Calculated ejection fraction 72%.  Past Medical History  Diagnosis Date  . Asthma   . Obesity   . Maxillary sinusitis   . Osteoarthritis   . IBS (irritable bowel syndrome)   . Hyperlipidemia   .  COPD (chronic obstructive pulmonary disease)   . Pulmonary nodules   . Osteoporosis   . GERD (gastroesophageal reflux disease)   . H/O hiatal hernia   . Depression     mild  . Allergic bronchopulmonary aspergillosis 2008    sees Dr Edmund Hilda pulmonology  . Anemia     iron deficiency, resolved  . Normal cardiac stress test 11/2011    No evidence of ischemia or infarct. Calculated ejection fraction 72%.  . OSA (obstructive sleep apnea) 02/2012    has stopped using  cpap  . Anxiety   . Pneumonia 11/2011    "before 2013 I hadn't had pneumonia since I was a child" (04/13/2012)  . Schatzki's ring   . History of echocardiogram     Echo 6/16:  Mod LVH, EF 60-65%, no RWMA, Gr 1 DD, trivial MR, normal LA size.  Marland Kitchen CAD (coronary artery disease)     a. LHC 6/16:  oOM1 60, pRCA 25 >> med Rx    Past Surgical History  Procedure Laterality Date  . Cesarean section  1985  . Hernia repair  04/13/2012    VHR laparoscopic  . Appendectomy  1989  . Ventral hernia repair  04/13/2012    Procedure: LAPAROSCOPIC VENTRAL HERNIA;  Surgeon: Adin Hector, MD;  Location: [facility];  Service: General;  Laterality: N/A;  laparoscopic repair of incarcerated hernia  . Cardiac catheterization N/A 11/25/2014    Procedure: Right/Left Heart Cath and Coronary Angiography;  Surgeon: Belva Crome, MD;  Location: [facility];  Service: Cardiovascular;  Laterality: N/A;     Current Outpatient Prescriptions  Medication Sig Dispense Refill  . ALPRAZolam (XANAX) 0.5 MG tablet Take 1 tablet (0.5 mg total) by mouth 2 (two) times daily as needed for anxiety (Palpatations, SOB). 30 tablet 1  . aspirin 81 MG tablet Take 81 mg by mouth daily.     . Cholecalciferol (VITAMIN D) 2000 UNITS CAPS Take 2,000 Units by mouth daily.     . Coenzyme Q10 (CO Q 10 PO) Take 200 mg by mouth 2 (two) times daily.    . fexofenadine (ALLEGRA) 180 MG tablet Take 180 mg by mouth daily as needed for allergies.     . furosemide  (LASIX) 20 MG tablet Take 1 tablet (20 mg total) by mouth daily as needed. 30 tablet 1  . HYDROcodone-homatropine (HYCODAN) 5-1.5 MG/5ML syrup Take 5 mLs by mouth every 6 (six) hours as needed for cough. 240 mL 0  . ibuprofen (ADVIL,MOTRIN) 200 MG tablet Take 200 mg by mouth every 6 (six) hours as needed for mild pain or moderate pain.    Marland Kitchen ipratropium-albuterol (DUONEB) 0.5-2.5 (3) MG/3ML SOLN Inhale 3 mLs into the lungs 4 (four) times daily as needed. (Patient taking differently: Inhale 3 mLs into the lungs 4 (four) times daily as needed (wheezing or shortness of breath). ) 360 mL 5  . mometasone (ASMANEX 60 METERED DOSES) 220 MCG/INH inhaler USE 2 INHALATIONS ORALLY EVERY EVENING 3 Inhaler 1  . montelukast (SINGULAIR) 10 MG tablet TAKE 1 TABLET BY MOUTH TWICE DAILY (Patient taking differently: Take 10 mg by mouth. TAKE 1 TABLET BY MOUTH once or TWICE DAILY) 180 tablet 3  .  nitroGLYCERIN (NITROSTAT) 0.4 MG SL tablet Place 1 tablet (0.4 mg total) under the tongue every 5 (five) minutes as needed for chest pain. 25 tablet 0  . omega-3 acid ethyl esters (LOVAZA) 1 G capsule Take 1 g by mouth 2 (two) times daily.    . ranitidine (ZANTAC) 300 MG tablet Take 1 tablet (300 mg total) by mouth at bedtime. 90 tablet 1  . SPIRIVA HANDIHALER 18 MCG inhalation capsule INHALE THE CONTENTS OF ONE CAPSULE DAILY 90 capsule 1  . triamcinolone (NASACORT AQ) 55 MCG/ACT AERO nasal inhaler Place 2 sprays into the nose daily as needed (for allergies).     . venlafaxine XR (EFFEXOR-XR) 150 MG 24 hr capsule Take 150 mg by mouth daily with breakfast.    . ezetimibe (ZETIA) 10 MG tablet Take 1 tablet (10 mg total) by mouth daily. 30 tablet 6   No current facility-administered medications for this visit.    Allergies:   Beclomethasone dipropionate; Budesonide-formoterol fumarate; Mometasone furo-formoterol fum; Sulfonamide derivatives; and Statins    Social History:  The patient  reports that she has never smoked. She has  never used smokeless tobacco. She reports that she drinks about 2.4 oz of alcohol per week. She reports that she does not use illicit drugs.   Family History:  The patient's family history includes Breast cancer in her mother and sister; Diabetes in her mother; Diverticulosis in her father; Heart attack in her maternal grandfather; Hypertension in her mother; Prostate cancer in her father; Pulmonary embolism in her brother. There is no history of Stroke.    ROS:   Please see the history of present illness.   Review of Systems  Cardiovascular: Positive for dyspnea on exertion.  All other systems reviewed and are negative.    PHYSICAL EXAM: VS:  BP 110/82 mmHg  Pulse 73  Ht 5\' 2"  (1.575 m)  Wt 167 lb (75.751 kg)  BMI 30.54 kg/m2    Wt Readings from Last 3 Encounters:  12/11/14 167 lb (75.751 kg)  12/04/14 168 lb (76.204 kg)  11/25/14 167 lb (75.751 kg)     GEN: Well nourished, well developed, in no acute distress HEENT: normal Neck: no JVD,   no masses Cardiac:  Normal S1/S2, RRR, no edema   Respiratory:  clear to auscultation bilaterally, no wheezing, rhonchi or rales. GI: soft, nontender, nondistended, + BS MS: no deformity or atrophy Skin: warm and dry  Neuro:  CNs II-XII intact, Strength and sensation are intact Psych: Normal affect   EKG:  EKG is ordered today.  It demonstrates:   NSR, HR 73, no change from prior tracing   Recent Labs: 09/03/2014: TSH 1.85 11/17/2014: B Natriuretic Peptide 20.8 11/21/2014: BUN 13; Creatinine, Ser 0.63; Potassium 3.8; Sodium 137 11/28/2014: ALT 19; Hemoglobin 12.1; Platelets 371.0    Lipid Panel    Component Value Date/Time   CHOL 239* 11/28/2014 0930   TRIG 118.0 11/28/2014 0930   HDL 45.10 11/28/2014 0930   CHOLHDL 5 11/28/2014 0930   VLDL 23.6 11/28/2014 0930   LDLCALC 170* 11/28/2014 0930   LDLDIRECT 177.8 09/07/2011 0945      ASSESSMENT AND PLAN:  CAD:  No further chest pain.  Question if this was related to resolving  pneumonia +/- anxiety. BP too soft to start anti-anginal Rx to cover for vasospasm.  Would continue to use prn NTG for now. Continue ASA.  She is intol of statins.   HLD (hyperlipidemia): She is intolerant to statins. She has been referred  to the lipid clinic.  She is willing to try Ezetimibe.  Start Ezetimibe 10 mg QD.  Check Lipids and LFTs in 2-3 mos.   Asthma, severe persistent, uncomplicated: Follow-up with pulmonology as planned.   Medication Adjustments: Current medicines are reviewed at length with the patient today.  Concerns regarding medicines are as outlined above.  The following changes have been made:   Modified Medications   No medications on file   Discontinued Medications   DIPHENHYDRAMINE (SOMINEX) 25 MG TABLET    Take 25 mg by mouth at bedtime as needed for itching.   New Prescriptions   EZETIMIBE (ZETIA) 10 MG TABLET    Take 1 tablet (10 mg total) by mouth daily.    Labs/ tests ordered today include:  Orders Placed This Encounter  Procedures  . Hepatic function panel  . Lipid Profile  . EKG 12-Lead    Disposition:   FU with Dr. Ena Dawley 6 mos.     Signed, Versie Starks, MHS 12/11/2014 1:15 PM    [facility] [place], [place], [place]  [address] Phone: [phone]; Fax: [phone]

## 2014-12-11 ENCOUNTER — Ambulatory Visit (INDEPENDENT_AMBULATORY_CARE_PROVIDER_SITE_OTHER): Payer: Managed Care, Other (non HMO) | Admitting: Physician Assistant

## 2014-12-11 ENCOUNTER — Encounter: Payer: Self-pay | Admitting: Physician Assistant

## 2014-12-11 VITALS — BP 110/82 | HR 73 | Ht 62.0 in | Wt 167.0 lb

## 2014-12-11 DIAGNOSIS — R0789 Other chest pain: Secondary | ICD-10-CM

## 2014-12-11 DIAGNOSIS — I517 Cardiomegaly: Secondary | ICD-10-CM | POA: Diagnosis not present

## 2014-12-11 DIAGNOSIS — E785 Hyperlipidemia, unspecified: Secondary | ICD-10-CM | POA: Diagnosis not present

## 2014-12-11 MED ORDER — EZETIMIBE 10 MG PO TABS: 10.0000 mg | ORAL_TABLET | Freq: Every day | ORAL | Status: DC

## 2014-12-11 NOTE — Patient Instructions (Addendum)
Medication Instructions:   START TAKING ZETIA 10 MG ONCE A DAY   Labwork:  LFT AND LIPIDS IN 2 TO 3 MONTHS   Testing/Procedures:   Follow-Up:  NEEDS TO BE SEEN IN LIPID CLINIC FOR HYPERLIPIDEMIA  Your physician wants you to follow-up in:  IN [place] will receive a reminder letter in the mail two months in advance. If you don't receive a letter, please call our office to schedule the follow-up appointment.  Any Other Special Instructions Will Be Listed Below (If Applicable).

## 2014-12-12 ENCOUNTER — Ambulatory Visit: Payer: Managed Care, Other (non HMO) | Admitting: Physician Assistant

## 2014-12-23 ENCOUNTER — Other Ambulatory Visit (INDEPENDENT_AMBULATORY_CARE_PROVIDER_SITE_OTHER): Payer: Managed Care, Other (non HMO)

## 2014-12-23 DIAGNOSIS — R5383 Other fatigue: Secondary | ICD-10-CM | POA: Diagnosis not present

## 2014-12-24 LAB — VITAMIN B12: VITAMIN B 12: 841 pg/mL (ref 211–911)

## 2014-12-25 ENCOUNTER — Encounter: Payer: Managed Care, Other (non HMO) | Admitting: Physician Assistant

## 2015-01-01 ENCOUNTER — Encounter: Payer: Managed Care, Other (non HMO) | Admitting: Family Medicine

## 2015-01-12 ENCOUNTER — Telehealth: Payer: Self-pay | Admitting: Family Medicine

## 2015-01-12 ENCOUNTER — Ambulatory Visit (INDEPENDENT_AMBULATORY_CARE_PROVIDER_SITE_OTHER): Payer: Commercial Managed Care - HMO | Admitting: Family Medicine

## 2015-01-12 ENCOUNTER — Encounter: Payer: Self-pay | Admitting: Family Medicine

## 2015-01-12 VITALS — BP 118/80 | HR 83 | Temp 98.2°F | Resp 16 | Ht 62.0 in | Wt 168.8 lb

## 2015-01-12 DIAGNOSIS — R0602 Shortness of breath: Secondary | ICD-10-CM | POA: Diagnosis not present

## 2015-01-12 DIAGNOSIS — I517 Cardiomegaly: Secondary | ICD-10-CM | POA: Diagnosis not present

## 2015-01-12 DIAGNOSIS — E669 Obesity, unspecified: Secondary | ICD-10-CM

## 2015-01-12 DIAGNOSIS — J189 Pneumonia, unspecified organism: Secondary | ICD-10-CM | POA: Diagnosis not present

## 2015-01-12 DIAGNOSIS — R030 Elevated blood-pressure reading, without diagnosis of hypertension: Secondary | ICD-10-CM

## 2015-01-12 DIAGNOSIS — R9389 Abnormal findings on diagnostic imaging of other specified body structures: Secondary | ICD-10-CM

## 2015-01-12 DIAGNOSIS — J4551 Severe persistent asthma with (acute) exacerbation: Secondary | ICD-10-CM

## 2015-01-12 NOTE — Patient Instructions (Signed)
Vitamin D 2000 IU daily Curcumen/Turmeric caps daily Probiotics such as Digestive Advantage or [place] or Krill or Flaxseed caps daily   NOW company at [email or web address]  Cholesterol Cholesterol is a white, waxy, fat-like substance needed by your body in small amounts. The liver makes all the cholesterol you need. Cholesterol is carried from the liver by the blood through the blood vessels. Deposits of cholesterol (plaque) may build up on blood vessel walls. These make the arteries narrower and stiffer. Cholesterol plaques increase the risk for heart attack and stroke.  You cannot feel your cholesterol level even if it is very high. The only way to know it is high is with a blood test. Once you know your cholesterol levels, you should keep a record of the test results. Work with your health care provider to keep your levels in the desired range.  WHAT DO THE RESULTS MEAN?  Total cholesterol is a rough measure of all the cholesterol in your blood.   LDL is the so-called bad cholesterol. This is the type that deposits cholesterol in the walls of the arteries. You want this level to be low.   HDL is the good cholesterol because it cleans the arteries and carries the LDL away. You want this level to be high.  Triglycerides are fat that the body can either burn for energy or store. High levels are closely linked to heart disease.  WHAT ARE THE DESIRED LEVELS OF CHOLESTEROL?  Total cholesterol below 200.   LDL below 100 for people at risk, below 70 for those at very high risk.   HDL above 50 is good, above 60 is best.   Triglycerides below 150.  HOW CAN I LOWER MY CHOLESTEROL?  Diet. Follow your diet programs as directed by your health care provider.   Choose fish or white meat chicken and [place], roasted or baked. Limit fatty cuts of red meat, fried foods, and processed meats, such as sausage and lunch meats.   Eat lots of fresh fruits and  vegetables.  Choose whole grains, beans, pasta, potatoes, and cereals.   Use only small amounts of olive, corn, or canola oils.   Avoid butter, mayonnaise, shortening, or palm kernel oils.  Avoid foods with trans fats.   Drink skim or nonfat milk and eat low-fat or nonfat yogurt and cheeses. Avoid whole milk, cream, ice cream, egg yolks, and full-fat cheeses.   Healthy desserts include angel food cake, ginger snaps, animal crackers, hard candy, popsicles, and low-fat or nonfat frozen yogurt. Avoid pastries, cakes, pies, and cookies.   Exercise. Follow your exercise programs as directed by your health care provider.   A regular program helps decrease LDL and raise HDL.   A regular program helps with weight control.   Do things that increase your activity level like gardening, walking, or taking the stairs. Ask your health care provider about how you can be more active in your daily life.   Medicine. Take medicine only as directed by your health care provider.   Medicine may be prescribed by your health care provider to help lower cholesterol and decrease the risk for heart disease.   If you have several risk factors, you may need medicine even if your levels are normal. Document Released: 03/01/2001 Document Revised: 10/21/2013 Document Reviewed: 03/20/2013 [facility] Patient Information 2015 [place], [place]. This information is not intended to replace advice given to you by your health care provider. Make sure you discuss any questions you have with your  health care provider.  

## 2015-01-12 NOTE — Progress Notes (Signed)
Victoria [name]  [id number] 04-Jan-1956 01/12/2015      Progress Note-Follow Up  Subjective  Chief Complaint  Chief Complaint  Patient presents with  . Follow-up    HPI  [name] in today for routine medical care. Patient is in today for routine follow-up. Overall she is slowly improving. She notes that she continues to struggle with fatigue and shortness of breath but it is improved over the last few months. She's had no other recent illness or fevers. Has not been exercising regularly but is trying to eat a heart healthy diet. Denies CP/pal/HA/congestion/fevers/GI or GU c/o. Taking meds as prescribed  Past Medical History  Diagnosis Date  . Asthma   . Obesity   . Maxillary sinusitis   . Osteoarthritis   . IBS (irritable bowel syndrome)   . Hyperlipidemia   . COPD (chronic obstructive pulmonary disease)   . Pulmonary nodules   . Osteoporosis   . GERD (gastroesophageal reflux disease)   . H/O hiatal hernia   . Depression     mild  . Allergic bronchopulmonary aspergillosis 2008    sees Dr Edmund Hilda pulmonology  . Anemia     iron deficiency, resolved  . Normal cardiac stress test 11/2011    No evidence of ischemia or infarct. Calculated ejection fraction 72%.  . OSA (obstructive sleep apnea) 02/2012    has stopped using  cpap  . Anxiety   . Pneumonia 11/2011    "before 2013 I hadn't had pneumonia since I was a child" (04/13/2012)  . Schatzki's ring   . History of echocardiogram     Echo 6/16:  Mod LVH, EF 60-65%, no RWMA, Gr 1 DD, trivial MR, normal LA size.  Marland Kitchen CAD (coronary artery disease)     a. LHC 6/16:  oOM1 60, pRCA 25 >> med Rx    Past Surgical History  Procedure Laterality Date  . Cesarean section  1985  . Hernia repair  04/13/2012    VHR laparoscopic  . Appendectomy  1989  . Ventral hernia repair  04/13/2012    Procedure: LAPAROSCOPIC VENTRAL HERNIA;  Surgeon: Adin Hector, MD;  Location: [facility];  Service: General;   Laterality: N/A;  laparoscopic repair of incarcerated hernia  . Cardiac catheterization N/A 11/25/2014    Procedure: Right/Left Heart Cath and Coronary Angiography;  Surgeon: Belva Crome, MD;  Location: [facility];  Service: Cardiovascular;  Laterality: N/A;    Family History  Problem Relation Age of Onset  . Breast cancer Mother   . Diverticulosis Father   . Prostate cancer Father     prostate  . Pulmonary embolism Brother     recurrent  . Breast cancer Sister     breast  . Heart attack Maternal Grandfather   . Stroke Neg Hx   . Hypertension Mother   . Diabetes Mother     History   Social History  . Marital Status: Married    Spouse Name: N/A  . Number of Children: 1  . Years of Education: N/A   Occupational History  .     Social History Main Topics  . Smoking status: Never Smoker   . Smokeless tobacco: Never Used  . Alcohol Use: 2.4 oz/week    4 Glasses of wine per week     Comment: 04/13/2012 "beer in the summer; wine 3-4 times/wk"  . Drug Use: No  . Sexual Activity: Yes     Comment: gluten free,  lives with husband and son with CP quadriplegia   Other Topics Concern  . Not on file   Social History Narrative   Cares for a 19yo son with cerebral palsy.     Current Outpatient Prescriptions on File Prior to Visit  Medication Sig Dispense Refill  . ALPRAZolam (XANAX) 0.5 MG tablet Take 1 tablet (0.5 mg total) by mouth 2 (two) times daily as needed for anxiety (Palpatations, SOB). 30 tablet 1  . aspirin 81 MG tablet Take 81 mg by mouth daily.     . Cholecalciferol (VITAMIN D) 2000 UNITS CAPS Take 2,000 Units by mouth daily.     . Coenzyme Q10 (CO Q 10 PO) Take 200 mg by mouth 2 (two) times daily.    Marland Kitchen ezetimibe (ZETIA) 10 MG tablet Take 1 tablet (10 mg total) by mouth daily. 30 tablet 6  . fexofenadine (ALLEGRA) 180 MG tablet Take 180 mg by mouth daily as needed for allergies.     . furosemide (LASIX) 20 MG tablet Take 1 tablet (20 mg total) by mouth  daily as needed. 30 tablet 1  . HYDROcodone-homatropine (HYCODAN) 5-1.5 MG/5ML syrup Take 5 mLs by mouth every 6 (six) hours as needed for cough. 240 mL 0  . ibuprofen (ADVIL,MOTRIN) 200 MG tablet Take 200 mg by mouth every 6 (six) hours as needed for mild pain or moderate pain.    Marland Kitchen ipratropium-albuterol (DUONEB) 0.5-2.5 (3) MG/3ML SOLN Inhale 3 mLs into the lungs 4 (four) times daily as needed. (Patient taking differently: Inhale 3 mLs into the lungs 4 (four) times daily as needed (wheezing or shortness of breath). ) 360 mL 5  . mometasone (ASMANEX 60 METERED DOSES) 220 MCG/INH inhaler USE 2 INHALATIONS ORALLY EVERY EVENING 3 Inhaler 1  . montelukast (SINGULAIR) 10 MG tablet TAKE 1 TABLET BY MOUTH TWICE DAILY (Patient taking differently: Take 10 mg by mouth. TAKE 1 TABLET BY MOUTH once or TWICE DAILY) 180 tablet 3  . nitroGLYCERIN (NITROSTAT) 0.4 MG SL tablet Place 1 tablet (0.4 mg total) under the tongue every 5 (five) minutes as needed for chest pain. 25 tablet 0  . omega-3 acid ethyl esters (LOVAZA) 1 G capsule Take 1 g by mouth 2 (two) times daily.    Marland Kitchen SPIRIVA HANDIHALER 18 MCG inhalation capsule INHALE THE CONTENTS OF ONE CAPSULE DAILY 90 capsule 1  . triamcinolone (NASACORT AQ) 55 MCG/ACT AERO nasal inhaler Place 2 sprays into the nose daily as needed (for allergies).     . venlafaxine XR (EFFEXOR-XR) 150 MG 24 hr capsule Take 150 mg by mouth daily with breakfast.    . ranitidine (ZANTAC) 300 MG tablet Take 1 tablet (300 mg total) by mouth at bedtime. (Patient not taking: Reported on 01/12/2015) 90 tablet 1   No current facility-administered medications on file prior to visit.    Allergies  Allergen Reactions  . Beclomethasone Dipropionate Hives and Other (See Comments)     weight gain  . Budesonide-Formoterol Fumarate Hives  . Mometasone Furo-Formoterol Fum Hives and Other (See Comments)    weight gain  . Sulfonamide Derivatives Hives and Rash  . Statins     Myalgias, RLS     Review of Systems  Review of Systems  Constitutional: Positive for malaise/fatigue. Negative for fever.  HENT: Negative for congestion.   Eyes: Negative for discharge.  Respiratory: Positive for shortness of breath.   Cardiovascular: Negative for chest pain, palpitations and leg swelling.  Gastrointestinal: Negative for nausea, abdominal pain and diarrhea.  Genitourinary: Negative for dysuria.  Musculoskeletal: Negative for falls.  Skin: Negative for rash.  Neurological: Negative for loss of consciousness and headaches.  Endo/Heme/Allergies: Negative for polydipsia.  Psychiatric/Behavioral: Negative for depression and suicidal ideas. The patient is not nervous/anxious and does not have insomnia.     Objective  BP 118/80 mmHg  Pulse 83  Temp(Src) 98.2 F (36.8 C) (Oral)  Resp 16  Ht 5\' 2"  (1.575 m)  Wt 168 lb 12.8 oz (76.567 kg)  BMI 30.87 kg/m2  SpO2 97%  Physical Exam  Physical Exam  Constitutional: She is oriented to person, place, and time and well-developed, well-nourished, and in no distress. No distress.  HENT:  Head: Normocephalic and atraumatic.  Eyes: Conjunctivae are normal.  Neck: Neck supple. No thyromegaly present.  Cardiovascular: Normal rate, regular rhythm and normal heart sounds.   No murmur heard. Pulmonary/Chest: Effort normal and breath sounds normal. She has no wheezes.  Abdominal: She exhibits no distension and no mass.  Musculoskeletal: She exhibits no edema.  Lymphadenopathy:    She has no cervical adenopathy.  Neurological: She is alert and oriented to person, place, and time.  Skin: Skin is warm and dry. No rash noted. She is not diaphoretic.  Psychiatric: Memory, affect and judgment normal.    Lab Results  Component Value Date   TSH 1.85 09/03/2014   Lab Results  Component Value Date   WBC 6.9 11/28/2014   HGB 12.1 11/28/2014   HCT 37.1 11/28/2014   MCV 89.0 11/28/2014   PLT 371.0 11/28/2014   Lab Results  Component Value  Date   CREATININE 0.63 11/21/2014   BUN 13 11/21/2014   NA 137 11/21/2014   K 3.8 11/21/2014   CL 102 11/21/2014   CO2 28 11/21/2014   Lab Results  Component Value Date   ALT 19 11/28/2014   AST 15 11/28/2014   ALKPHOS 106 11/28/2014   BILITOT 0.3 11/28/2014   Lab Results  Component Value Date   CHOL 239* 11/28/2014   Lab Results  Component Value Date   HDL 45.10 11/28/2014   Lab Results  Component Value Date   LDLCALC 170* 11/28/2014   Lab Results  Component Value Date   TRIG 118.0 11/28/2014   Lab Results  Component Value Date   CHOLHDL 5 11/28/2014     Assessment & Plan  SOB (shortness of breath) Improved at this time. This was bad for several months but feels better at this time.  Cardiomegaly Following with cardiology now, no recent event since cardiac work up. Will follow up with cardiology as planned and continue to minimize risk factors for progression  Pneumonia Reviewed June 2016 xray which showed improvement, they have recommended a follow up CXR to assess for complete resolution. Will order  OSTEOPENIA Takes vitamin D daily, encouraged increased exercise  Obesity Encouraged DASH diet, decrease po intake and increase exercise as tolerated. Needs 7-8 hours of sleep nightly. Avoid trans fats, eat small, frequent meals every 4-5 hours with lean proteins, complex carbs and healthy fats. Minimize simple carbs  ELEVATED BP READING WITHOUT DX HYPERTENSION Well controlled. Encouraged heart healthy diet such as the DASH diet and exercise as tolerated.   Asthma, severe persistent Clear today, following with pulmonology now and improving

## 2015-01-12 NOTE — Assessment & Plan Note (Signed)
Well controlled. Encouraged heart healthy diet such as the DASH diet and exercise as tolerated.  

## 2015-01-12 NOTE — Telephone Encounter (Signed)
Pt returning call. Best # [phone].

## 2015-01-12 NOTE — Assessment & Plan Note (Signed)
Takes vitamin D daily, encouraged increased exercise

## 2015-01-12 NOTE — Assessment & Plan Note (Signed)
Improved at this time. This was bad for several months but feels better at this time.

## 2015-01-12 NOTE — Assessment & Plan Note (Signed)
Reviewed June 2016 xray which showed improvement, they have recommended a follow up CXR to assess for complete resolution. Will order

## 2015-01-12 NOTE — Assessment & Plan Note (Signed)
Encouraged DASH diet, decrease po intake and increase exercise as tolerated. Needs 7-8 hours of sleep nightly. Avoid trans fats, eat small, frequent meals every 4-5 hours with lean proteins, complex carbs and healthy fats. Minimize simple carbs 

## 2015-01-12 NOTE — Telephone Encounter (Signed)
CXR ordered and patient notified.

## 2015-01-12 NOTE — Assessment & Plan Note (Signed)
Clear today, following with pulmonology now and improving

## 2015-01-12 NOTE — Assessment & Plan Note (Addendum)
Following with cardiology now, no recent event since cardiac work up. Will follow up with cardiology as planned and continue to minimize risk factors for progression

## 2015-01-12 NOTE — Progress Notes (Signed)
Pre visit review using our clinic review tool, if applicable. No additional management support is needed unless otherwise documented below in the visit note. 

## 2015-01-16 ENCOUNTER — Ambulatory Visit (HOSPITAL_BASED_OUTPATIENT_CLINIC_OR_DEPARTMENT_OTHER)
Admission: RE | Admit: 2015-01-16 | Discharge: 2015-01-16 | Disposition: A | Discharge status: Home / Self Care | Payer: Commercial Managed Care - HMO | Source: Ambulatory Visit | Attending: Family Medicine | Admitting: Family Medicine

## 2015-01-16 ENCOUNTER — Other Ambulatory Visit: Payer: Self-pay | Admitting: *Deleted

## 2015-01-16 ENCOUNTER — Telehealth: Payer: Self-pay | Admitting: *Deleted

## 2015-01-16 DIAGNOSIS — F419 Anxiety disorder, unspecified: Principal | ICD-10-CM

## 2015-01-16 DIAGNOSIS — F32A Depression, unspecified: Secondary | ICD-10-CM

## 2015-01-16 DIAGNOSIS — F329 Major depressive disorder, single episode, unspecified: Secondary | ICD-10-CM

## 2015-01-16 DIAGNOSIS — R918 Other nonspecific abnormal finding of lung field: Secondary | ICD-10-CM | POA: Diagnosis not present

## 2015-01-16 DIAGNOSIS — R05 Cough: Secondary | ICD-10-CM | POA: Diagnosis present

## 2015-01-16 DIAGNOSIS — R9389 Abnormal findings on diagnostic imaging of other specified body structures: Secondary | ICD-10-CM

## 2015-01-16 MED ORDER — ALPRAZOLAM 0.5 MG PO TABS: 0.5000 mg | ORAL_TABLET | Freq: Two times a day (BID) | ORAL | Status: DC | PRN

## 2015-01-16 MED ORDER — PREDNISONE 10 MG PO TABS: ORAL_TABLET | ORAL | Status: DC

## 2015-01-16 NOTE — Telephone Encounter (Signed)
OK to refill Alprazolam same sig, same number same rf

## 2015-01-16 NOTE — Telephone Encounter (Signed)
Patient requesting Prednisone dose-pack.  Per Dr. Charlett Blake, [place] to order 10mg  Prednisone for patient with instructions: 4 tabs for 2 days, then 3 tabs for 2 days, 2 tabs for 2 days, then 1 tab for 2 days, then stop.    Notified patient via voicemail that Rx was sent to pharmacy and to call back if symptoms are not improving, go to ER if develop severe SOB.

## 2015-01-16 NOTE — Telephone Encounter (Signed)
Rx printed, patient notified and faxed to [facility].

## 2015-01-16 NOTE — Telephone Encounter (Signed)
Patient requesting Alprazolam refills:  Last refill- 07/29/14 for 30 with 1 LOV 01/12/15 CSC and UDS 07/30/14- low risk  Please advise

## 2015-01-20 ENCOUNTER — Ambulatory Visit: Payer: Commercial Managed Care - HMO | Admitting: Pharmacist

## 2015-02-03 ENCOUNTER — Encounter: Payer: Self-pay | Admitting: Internal Medicine

## 2015-02-03 ENCOUNTER — Ambulatory Visit (INDEPENDENT_AMBULATORY_CARE_PROVIDER_SITE_OTHER): Payer: Commercial Managed Care - HMO | Admitting: Internal Medicine

## 2015-02-03 VITALS — BP 122/68 | HR 97 | Ht 62.0 in | Wt 167.0 lb

## 2015-02-03 DIAGNOSIS — J4550 Severe persistent asthma, uncomplicated: Secondary | ICD-10-CM | POA: Diagnosis not present

## 2015-02-03 DIAGNOSIS — J82 Pulmonary eosinophilia, not elsewhere classified: Secondary | ICD-10-CM | POA: Diagnosis not present

## 2015-02-03 DIAGNOSIS — B4481 Allergic bronchopulmonary aspergillosis: Secondary | ICD-10-CM

## 2015-02-03 DIAGNOSIS — J8283 Eosinophilic asthma: Secondary | ICD-10-CM | POA: Insufficient documentation

## 2015-02-03 MED ORDER — TIOTROPIUM BROMIDE MONOHYDRATE 18 MCG IN CAPS: ORAL_CAPSULE | RESPIRATORY_TRACT | Status: DC

## 2015-02-03 MED ORDER — MONTELUKAST SODIUM 10 MG PO TABS: ORAL_TABLET | ORAL | Status: DC

## 2015-02-03 NOTE — Progress Notes (Signed)
Subjective:    Patient ID: Victoria [name], female    DOB: 09/11/1955, 59 y.o.   MRN: [id number]  HPI   59  y.o. female with mild OSA, severe persistent asthma and ABPA   11/13/14  Post hospital follow up  Pt returns for post hospital follow up .  Admitted x 2 recently for CAP. Found to have bilateral on CXR , tx w/ abx and discharged  Went home briefly but developed chest pain /pleuritic pain with worsening dyspnea.  Went back to hospital , CT chest was neg for PE , showed RLL PNA .  Cardiac enzymes were mildly elevated with repeat neg. Lactate and procalcitonin nml.  She was treated with IV abx and discharged on zithromax and cefdinir.  Since discharge she remains very weak and has sign cough and pleuritic pain on right.  No fever, hemoptyisis, calf pain, orthopnea, or exertional chest pain.  Taking percocet for pain, which helps.   12/04/2014 Follow up :  Mild OSA, severe persistent asthma and ABPA  Patient returns for a two-week follow-up. Recently admitted for slow to resolve pneumonia on the right. Treated with anabiotic's and discharged on Zithromax and Ceftin ear. He was seen 3 weeks ago. Shortly after discharge. Chest x-ray showed persistent bilateral pneumonia with a right effusion. Patient was instructed to finish her anabiotic's and given a prednisone taper. Patient continued to have pleuritic pain. She went to the emergency room and was referred to cardiology 2-D echo showed an EF of 60-65% with moderate LVH and grade 1 diastolic dysfunction. She underwent a right and left heart catheter showed nonobstructive coronary disease - 25-30% narrowing in the RCA  and 60-70% stenosis in the first obtuse marginal with no indication for  PCI. Per cardiology. Normal pulmonary artery pressures  She was recommended for 81 mg of aspirin daily.  OV 02/03/2015  Chief Complaint  Patient presents with  . Follow-up    Pt here after seeing TP for pna. Pt states she is feeling better. Pt  c/o persistent dry cough. Pt denies SOB and CP/tightness.     Follow-up severe persistent asthma + ABPA + plus peripheral eosinophilia in setting of moderate size hiatal hernia   Presents for asthma follow-up. At this point in time she is doing well. In May 2016 had right lower lobe pneumonia. Follow-up chest x-ray 01/16/2015 showed significant clearing other than right lower lobe scarring. Currently maintained on Allegra, DuoNeb, Asmanex, Singulair, Zegerid, Spiriva and Nasacort for asthma. Asthma is under good control. ACQ5. questionnaire is 1.0 suggesting good control of asthma. In the middle of the night she hardly ever has any asthma symptoms and when she wakes up in the morning she has no symptoms. In the day she is only very slightly limited with her activities because of asthma and only experiences dyspnea very little and has wheezed only a little of the time without using any albuterol for rescue the past one week.  She is frustrated by her pneumonia this was the second episode in 2 years. Currently she is feeling well. She's wondering why she has repeated pneumonia. Of course she has not addressed her hiatal hernia issues yet.  She is open to trying new therapies for asthma particularly in the setting of eosinophilia  Current outpatient prescriptions:  .  ALPRAZolam (XANAX) 0.5 MG tablet, Take 1 tablet (0.5 mg total) by mouth 2 (two) times daily as needed for anxiety (Palpatations, SOB)., Disp: 30 tablet, Rfl: 1 .  aspirin 81 MG tablet,  Take 81 mg by mouth daily. , Disp: , Rfl:  .  Cholecalciferol (VITAMIN D) 2000 UNITS CAPS, Take 2,000 Units by mouth daily. , Disp: , Rfl:  .  Coenzyme Q10 (CO Q 10 PO), Take 200 mg by mouth 2 (two) times daily., Disp: , Rfl:  .  ezetimibe (ZETIA) 10 MG tablet, Take 1 tablet (10 mg total) by mouth daily., Disp: 30 tablet, Rfl: 6 .  fexofenadine (ALLEGRA) 180 MG tablet, Take 180 mg by mouth daily as needed for allergies. , Disp: , Rfl:  .  furosemide  (LASIX) 20 MG tablet, Take 1 tablet (20 mg total) by mouth daily as needed., Disp: 30 tablet, Rfl: 1 .  HYDROcodone-homatropine (HYCODAN) 5-1.5 MG/5ML syrup, Take 5 mLs by mouth every 6 (six) hours as needed for cough., Disp: 240 mL, Rfl: 0 .  ibuprofen (ADVIL,MOTRIN) 200 MG tablet, Take 200 mg by mouth every 6 (six) hours as needed for mild pain or moderate pain., Disp: , Rfl:  .  ipratropium-albuterol (DUONEB) 0.5-2.5 (3) MG/3ML SOLN, Inhale 3 mLs into the lungs 4 (four) times daily as needed. (Patient taking differently: Inhale 3 mLs into the lungs 4 (four) times daily as needed (wheezing or shortness of breath). ), Disp: 360 mL, Rfl: 5 .  mometasone (ASMANEX 60 METERED DOSES) 220 MCG/INH inhaler, USE 2 INHALATIONS ORALLY EVERY EVENING, Disp: 3 Inhaler, Rfl: 1 .  montelukast (SINGULAIR) 10 MG tablet, TAKE 1 TABLET BY MOUTH TWICE DAILY (Patient taking differently: Take 10 mg by mouth. TAKE 1 TABLET BY MOUTH once or TWICE DAILY), Disp: 180 tablet, Rfl: 3 .  nitroGLYCERIN (NITROSTAT) 0.4 MG SL tablet, Place 1 tablet (0.4 mg total) under the tongue every 5 (five) minutes as needed for chest pain., Disp: 25 tablet, Rfl: 0 .  omega-3 acid ethyl esters (LOVAZA) 1 G capsule, Take 1 g by mouth 2 (two) times daily., Disp: , Rfl:  .  Omeprazole-Sodium Bicarbonate (ZEGERID OTC PO), Take 1 tablet by mouth daily., Disp: , Rfl:  .  ranitidine (ZANTAC) 300 MG tablet, Take 1 tablet (300 mg total) by mouth at bedtime., Disp: 90 tablet, Rfl: 1 .  SPIRIVA HANDIHALER 18 MCG inhalation capsule, INHALE THE CONTENTS OF ONE CAPSULE DAILY, Disp: 90 capsule, Rfl: 1 .  triamcinolone (NASACORT AQ) 55 MCG/ACT AERO nasal inhaler, Place 2 sprays into the nose daily as needed (for allergies). , Disp: , Rfl:  .  venlafaxine XR (EFFEXOR-XR) 150 MG 24 hr capsule, Take 150 mg by mouth daily with breakfast., Disp: , Rfl:     Review of Systems  Constitutional: Negative for fever and unexpected weight change.  HENT: Negative for  congestion, dental problem, ear pain, nosebleeds, postnasal drip, rhinorrhea, sinus pressure, sneezing, sore throat and trouble swallowing.   Eyes: Negative for redness and itching.  Respiratory: Positive for cough. Negative for chest tightness, shortness of breath and wheezing.   Cardiovascular: Negative for palpitations and leg swelling.  Gastrointestinal: Negative for nausea and vomiting.  Genitourinary: Negative for dysuria.  Musculoskeletal: Negative for joint swelling.  Skin: Negative for rash.  Neurological: Negative for headaches.  Hematological: Does not bruise/bleed easily.  Psychiatric/Behavioral: Negative for dysphoric mood. The patient is not nervous/anxious.        Objective:   Physical Exam  Constitutional: She is oriented to person, place, and time. She appears well-developed and well-nourished. No distress.  Body mass index is 30.54 kg/(m^2).   HENT:  Head: Normocephalic and atraumatic.  Right Ear: External ear normal.  Left  Ear: External ear normal.  Mouth/Throat: Oropharynx is clear and moist. No oropharyngeal exudate.  Body mass index is 30.54 kg/(m^2).   Eyes: Conjunctivae and EOM are normal. Pupils are equal, round, and reactive to light. Right eye exhibits no discharge. Left eye exhibits no discharge. No scleral icterus.  Neck: Normal range of motion. Neck supple. No JVD present. No tracheal deviation present. No thyromegaly present.  Cardiovascular: Normal rate, regular rhythm, normal heart sounds and intact distal pulses.  Exam reveals no gallop and no friction rub.   No murmur heard. Pulmonary/Chest: Effort normal and breath sounds normal. No respiratory distress. She has no wheezes. She has no rales. She exhibits no tenderness.  Abdominal: Soft. Bowel sounds are normal. She exhibits no distension and no mass. There is no tenderness. There is no rebound and no guarding.  Musculoskeletal: Normal range of motion. She exhibits no edema or tenderness.    Lymphadenopathy:    She has no cervical adenopathy.  Neurological: She is alert and oriented to person, place, and time. She has normal reflexes. No cranial nerve deficit. She exhibits normal muscle tone. Coordination normal.  Skin: Skin is warm and dry. No rash noted. She is not diaphoretic. No erythema. No pallor.  Psychiatric: She has a normal mood and affect. Her behavior is normal. Judgment and thought content normal.  Vitals reviewed.   Filed Vitals:   02/03/15 1331  BP: 122/68  Pulse: 97  Height: 5\' 2"  (1.575 m)  Weight: 167 lb (75.751 kg)  SpO2: 98%         Assessment & Plan:     ICD-9-CM ICD-10-CM   1. Eosinophilic asthma 921.1 H41   2. Asthma, severe persistent, uncomplicated 740.81 K48.18   3. A B P A-ALLERGIC BRONCHOPULMONARY ASPERGILLOSIS 518.6 B44.81      #Asthma + ABPA with bronchiectassis + eosinophilia -stable disease - glad you're able to maintain right now without chronic prednisone - Continue Allegra plus DuoNeb plus Asmanex plus Singulair plus Zegerid plus Spiriva plus Nasacort - Given recurrent exacerbation and peripheral eosinophilia will start paper work for [employer] (Mepoluzimab) injections   - needs insurance approval  - gets shingles vaccine ahead of time  #Followup  - 3 months  -  FeNO and ACQ at [place]  - CXR at followu for the RLL pneumonia of May 2016   - Check on hiatal hernia status on followup   Dr. Brand Males, M.D., [facility].C.P Pulmonary and Critical Care Medicine Staff Physician [place] [facility] Pager: [phone], If no answer or between  15:00h - 7:00h: call [phone]  02/03/2015 2:01 PM

## 2015-02-03 NOTE — Patient Instructions (Addendum)
ICD-9-CM ICD-10-CM   1. Eosinophilic asthma 993.5 T01   2. Asthma, severe persistent, uncomplicated 779.39 Q30.09   3. A B P A-ALLERGIC BRONCHOPULMONARY ASPERGILLOSIS 518.6 B44.81      #Asthma + ABPA with bronchiectassis + eosinophilia -stable disease - glad you're able to maintain right now without chronic prednisone - Continue Allegra plus DuoNeb plus Asmanex plus Singulair plus Zegerid plus Spiriva plus Nasacort - Given recurrent exacerbation and peripheral eosinophilia will start paper work for [employer] (Mepoluzimab) injections   - needs insurance approval  - gets shingles vaccine ahead of time  #Followup  - 3 months  -  FeNO and ACQ at [place]  - CXR at followu for the RLL pneumonia of May 2016   - Check on hiatal hernia status on followup

## 2015-02-14 ENCOUNTER — Telehealth: Payer: Self-pay | Admitting: Internal Medicine

## 2015-02-14 NOTE — Telephone Encounter (Signed)
What is status of nucala?

## 2015-02-16 NOTE — Telephone Encounter (Signed)
[name], please advise. Thanks.  

## 2015-02-16 NOTE — Telephone Encounter (Signed)
MR, I have the paperwork and will need you to sign; will get you to sign this afternoon. Once faxed back to [facility] to [facility]-I should get a response in 24-72 hours. Thanks.

## 2015-02-18 NOTE — Telephone Encounter (Signed)
Singed today 02/18/2015 and given to elise. clsing [email or web address]

## 2015-02-19 ENCOUNTER — Encounter: Payer: Managed Care, Other (non HMO) | Admitting: Family Medicine

## 2015-02-25 ENCOUNTER — Ambulatory Visit (HOSPITAL_BASED_OUTPATIENT_CLINIC_OR_DEPARTMENT_OTHER)
Admission: RE | Admit: 2015-02-25 | Discharge: 2015-02-25 | Disposition: A | Discharge status: Home / Self Care | Payer: Commercial Managed Care - HMO | Source: Ambulatory Visit | Attending: Medical | Admitting: Medical

## 2015-02-25 ENCOUNTER — Encounter: Payer: Self-pay | Admitting: Medical

## 2015-02-25 ENCOUNTER — Ambulatory Visit (INDEPENDENT_AMBULATORY_CARE_PROVIDER_SITE_OTHER): Payer: Commercial Managed Care - HMO | Admitting: Medical

## 2015-02-25 VITALS — BP 118/80 | HR 88 | Temp 97.1°F | Resp 16 | Ht 62.0 in | Wt 167.2 lb

## 2015-02-25 DIAGNOSIS — R059 Cough, unspecified: Secondary | ICD-10-CM

## 2015-02-25 DIAGNOSIS — R0781 Pleurodynia: Secondary | ICD-10-CM

## 2015-02-25 DIAGNOSIS — R002 Palpitations: Secondary | ICD-10-CM | POA: Diagnosis not present

## 2015-02-25 DIAGNOSIS — R0600 Dyspnea, unspecified: Secondary | ICD-10-CM | POA: Diagnosis not present

## 2015-02-25 DIAGNOSIS — R05 Cough: Secondary | ICD-10-CM | POA: Diagnosis not present

## 2015-02-25 DIAGNOSIS — Z8679 Personal history of other diseases of the circulatory system: Secondary | ICD-10-CM | POA: Diagnosis not present

## 2015-02-25 DIAGNOSIS — R062 Wheezing: Secondary | ICD-10-CM

## 2015-02-25 LAB — COMPREHENSIVE METABOLIC PANEL
ALT: 23 U/L (ref 0–35)
AST: 17 U/L (ref 0–37)
Albumin: 4.3 g/dL (ref 3.5–5.2)
Alkaline Phosphatase: 115 U/L (ref 39–117)
BILIRUBIN TOTAL: 0.3 mg/dL (ref 0.2–1.2)
BUN: 12 mg/dL (ref 6–23)
CO2: 29 meq/L (ref 19–32)
CREATININE: 0.64 mg/dL (ref 0.40–1.20)
Calcium: 9.4 mg/dL (ref 8.4–10.5)
Chloride: 102 mEq/L (ref 96–112)
GFR: 101.37 mL/min (ref >60.00)
Glucose, Bld: 102 mg/dL — ABNORMAL HIGH (ref 70–99)
Potassium: 4.3 mEq/L (ref 3.5–5.1)
SODIUM: 139 meq/L (ref 135–145)
Total Protein: 7 g/dL (ref 6.0–8.3)

## 2015-02-25 LAB — BRAIN NATRIURETIC PEPTIDE: Pro B Natriuretic peptide (BNP): 16 pg/mL (ref 0.0–100.0)

## 2015-02-25 NOTE — Progress Notes (Signed)
   Subjective:    Patient ID: Victoria [name], female    DOB: 1956/04/08, 59 y.o.   MRN: [id number]  HPI  Pt in states 2 wks ago she fell and landed on her rt side. Her rt elbow was tucked into her side. Pt states she may not have been breathing deeply after the fall. She had bruising after the fall. Pain was gradually subsiding. The this weekend she was coughing and had some wheezing. Yesterday and today fatigue.   Pt had some palpitation sensation on Sunday. Would come and go. Pt states last few minutes the stopped. Some brief palpitation  this am. Pt states no chest pain.  When she coughs she has some  Brief pain in chest wall. Pt uses hydrocodone cough syrup. She thinks this may cause her chest congestion.  Some problems sleeping flat on her back since Sunday. Lying supine her 02 sat today is 98%  Pt started 30 mg prednisone yesterday. Pt has used neb machine   Pt states history of enlarged heart. Pt tells me echo was normal. Tell me catheterization of heart showed some mild stenosis of arteries. Pt has nitroglycerin if needed for chest pina.         Review of Systems  Constitutional: Positive for fatigue. Negative for fever and chills.  Respiratory: Positive for cough, shortness of breath and wheezing. Negative for chest tightness.   Cardiovascular: Positive for palpitations. Negative for chest pain.  Gastrointestinal: Negative for abdominal pain.  Musculoskeletal: Negative for myalgias, back pain and neck stiffness.       No leg pain.  Neurological: Negative for dizziness, facial asymmetry and headaches.  Hematological: Negative for adenopathy. Does not bruise/bleed easily.  Psychiatric/Behavioral: Negative for behavioral problems and confusion.       Objective:   Physical Exam  General  Mental Status - Alert. General Appearance - Well groomed. Not in acute distress.  Skin Rashes- No Rashes.  HEENT Head- Normal. Ear Auditory Canal - Left- Normal. Right -  Normal.Tympanic Membrane- Left- Normal. Right- Normal. Eye Sclera/Conjunctiva- Left- Normal. Right- Normal. Nose & Sinuses Nasal Mucosa- Left-  Not oggy or Congested. Right-  Not  boggy or Congested. Mouth & Throat Lips: Upper Lip- Normal: no dryness, cracking, pallor, cyanosis, or vesicular eruption. Lower Lip-Normal: no dryness, cracking, pallor, cyanosis or vesicular eruption. Buccal Mucosa- Bilateral- No Aphthous ulcers. Oropharynx- No Discharge or Erythema. Tonsils: Characteristics- Bilateral- No Erythema or Congestion. Size/Enlargement- Bilateral- No enlargement. Discharge- bilateral-None.  Neck Neck- Supple. No Masses.   Chest and Lung Exam Auscultation: Breath Sounds:- even and unlabored, but bilateral upper lobe rhonchi.  Cardiovascular Auscultation:Rythm- Regular, rate and rhythm. Murmurs & Other Heart Sounds:Ausculatation of the heart reveal- No Murmurs.  Lymphatic Head & Neck General Head & Neck Lymphatics: Bilateral: Description- No Localized lymphadenopathy.  Lower extremities- no pedal edema. Negative homans sign   Lying supine 98%     Assessment & Plan:  Please get cxr today. Evaluate for infection and rib injury.  For wheezing. I would recommend taper dose of prednisone. Continue albuterol neb tx.  Rx azithormycin if xray + for infection or if clinically worsens.  Continue the hydrocodone cough syrup.   ekg looked nsr. If any cardiac signs and symptoms would recommend ED based on our cardiac history.  Follow up in 7 days or as

## 2015-02-25 NOTE — Patient Instructions (Addendum)
Please get cxr today. Evaluate for infection and rib injury.  For wheezing. I would recommend taper dose of prednisone. Continue albuterol neb tx.  Rx azithormycin if xray + for infection or if clinically worsens.  Continue the hydrocodone cough syrup.   ekg looked nsr. If any cardiac signs and symptoms would recommend ED based on our cardiac history.  Follow up in 7 days or as needed

## 2015-02-25 NOTE — Progress Notes (Signed)
Pre visit review using our clinic review tool, if applicable. No additional management support is needed unless otherwise documented below in the visit note. 

## 2015-02-26 ENCOUNTER — Telehealth: Payer: Self-pay | Admitting: Medical

## 2015-02-26 MED ORDER — AZITHROMYCIN 250 MG PO TABS: ORAL_TABLET | ORAL | Status: DC

## 2015-02-26 MED ORDER — PREDNISONE 20 MG PO TABS: ORAL_TABLET | ORAL | Status: DC

## 2015-02-26 NOTE — Telephone Encounter (Signed)
Left message for pt to call if she had any questions. Pt was notified Rx's were sent to [facility].

## 2015-02-26 NOTE — Telephone Encounter (Signed)
Returning your call. °

## 2015-02-26 NOTE — Telephone Encounter (Signed)
Spoke with pt and she understands. Pt is going to talk with PCP and pulm for further evaluation.

## 2015-02-26 NOTE — Telephone Encounter (Signed)
Notify pt that I did send in meds to [facility].

## 2015-02-27 ENCOUNTER — Encounter: Payer: Self-pay | Admitting: Family Medicine

## 2015-02-27 ENCOUNTER — Telehealth: Payer: Self-pay | Admitting: Cardiology

## 2015-02-27 ENCOUNTER — Encounter: Payer: Self-pay | Admitting: Internal Medicine

## 2015-02-27 MED ORDER — MOMETASONE FUROATE 220 MCG/INH IN AEPB: INHALATION_SPRAY | RESPIRATORY_TRACT | Status: DC

## 2015-02-27 MED ORDER — TIOTROPIUM BROMIDE MONOHYDRATE 18 MCG IN CAPS: ORAL_CAPSULE | RESPIRATORY_TRACT | Status: DC

## 2015-02-27 NOTE — Telephone Encounter (Signed)
Correction on message written below:  Pt did not call in with complaints of aflutter, chest pain, or sob.  Pt called in to schedule an appt with Dr Meda Coffee, for she received her recall letter in the mail.  Pt states she just wanted to mention to the operator to inform Dr Meda Coffee and myself that she went to her PCP 2 days ago, and had a full work-up for cough, wheezing, palpitations, and sob.  Pt states her PCP did a full work-up on her and ordered a chest xray, and everything came back normal, but she was started on po prednisone and azithromycin for treatment of symptomatic bronchitis and exacerbation of asthma.  Pt also had an ekg done by her PCP and was noted to be in normal sinus rhythm.  Pt had labs done as well, cmet, bnp, and both came back completely normal. Noted in PCP note with the pt on 9/7, assessment of her cardiac status was normal with no murmurs heard on ausculation.  Per PCP noted on 9/7 pt had LEE and sats were 98% RA.  BP118/80 and HR 80 at OV with PCP on 9/7.  Pt was afebrile. According to the pts PCP assessment and plan, he was leaning more towards pulmonary causing the pts complaints vs cardiac issues.  Per PCP note, he advised the pt to schedule an appt with him in 7 days to reevaluate her bronchitis since starting prednisone and z-pak.  PCP also advised the pt to schedule a follow-up with her Pulmonologist Dr Onnie Graham. No cardiac follow-up advised by the pts PCP.  Pt had a recent cath done by Dr Tamala Julian and there was not an indication for PCI.  Pt noted to have 60-70% stenosis in the ostium of the first obtuse marginal.  Echo was done on 11/28/14 and showed normal LV function and no significant valvular abnormalities. She does have some mild stiffness of her LV, per [employer]. Pt was referred to Lipid clinic.   Pt is convinced that her complaints she went to her PCP for, is due to her heart.  Informed the pt that based on all the work-up the pts PCP did on her, her cardiac  status was normal and he diagnosed her with bronchitis.  Informed the pt that Dr Meda Coffee is out of the office today but I will route this message to her for further review of this message and follow-up with her thereafter with any new recommendations.  Advised the pt to continue taking her abt and prednisone as prescribed by her PCP, and utilize her albuterol inhaler PRN for wheezing and cough.  Advised the pt to continue drinking plenty of water and rest during her recovery period. Advised the pt to follow-up with her PCP next week as advised.  Advised the pt that if she has any cardiac symptoms like chest pain, sob, palpitations, dizziness, pre-syncopal, or syncopal issues, then she should seek immediate medical attention.  Pt verbalized understanding and agrees with this plan.

## 2015-02-27 NOTE — Telephone Encounter (Signed)
New Message  Patient c/o Aflutter:  High priority if patient c/o lightheadedness and shortness of breath.  1. How long have you been having a-flutter? Since Sunday   2. Are you currently experiencing lightheadedness and shortness of breath? No not now. But she has experienced SOB. She has servere asthma and she thought that the asthma was the main cause for her symptoms. So she seen her PCP. Per Pt PCP gave her a dose of prednisone and an antibiotic.    3. Have you checked your BP and heart rate? (document readings) Heart rate isn't raised and BP is normal. Vitals are good  4. Are you experiencing any other symptoms? No   Comments: Pt called seen PCP on Wednesday everything came back normal. Pt believes this may be heart related. Please call.

## 2015-03-01 ENCOUNTER — Other Ambulatory Visit: Payer: Self-pay | Admitting: Family Medicine

## 2015-03-01 DIAGNOSIS — M419 Scoliosis, unspecified: Secondary | ICD-10-CM

## 2015-03-01 DIAGNOSIS — M546 Pain in thoracic spine: Secondary | ICD-10-CM

## 2015-03-01 NOTE — Telephone Encounter (Signed)
I completely agree with everything stated and would advised to follow up with Pulmonary [name].

## 2015-03-03 NOTE — Telephone Encounter (Signed)
Left message for the pt to call back to inform her that per Dr Meda Coffee she agrees with the plan mentioned below and recommends the pt to follow-up with her Pulmonologist [name] for complaints.

## 2015-03-03 NOTE — Telephone Encounter (Signed)
Spoke with the pt that per Dr Elinor Parkinson reviewed our conversation from 02/27/15 and she agreed with the plan verbalized to the pt on 9/9 conversation, and she recommends that she follow-up with her Pulmonologist [name].  Pt verbalized understanding and agrees with this plan.  Pt verbalized that the antibiotics and oral prednisone is helping with her Respiratory issues.  Pt states she will follow-up with Dr Golden Pop office today.  Pt very gracious for all the assistance provided.

## 2015-03-06 ENCOUNTER — Telehealth: Payer: Self-pay

## 2015-03-06 NOTE — Telephone Encounter (Signed)
LMOVM

## 2015-03-06 NOTE — Telephone Encounter (Signed)
Patient returning your call.

## 2015-03-11 ENCOUNTER — Emergency Department (HOSPITAL_BASED_OUTPATIENT_CLINIC_OR_DEPARTMENT_OTHER)
Admission: EM | Admit: 2015-03-11 | Discharge: 2015-03-11 | Disposition: A | Discharge status: Home / Self Care | Payer: Commercial Managed Care - HMO | Attending: Emergency Medicine | Admitting: Emergency Medicine

## 2015-03-11 ENCOUNTER — Encounter (HOSPITAL_BASED_OUTPATIENT_CLINIC_OR_DEPARTMENT_OTHER): Payer: Self-pay | Admitting: Emergency Medicine

## 2015-03-11 ENCOUNTER — Ambulatory Visit: Payer: Commercial Managed Care - HMO | Admitting: Medical

## 2015-03-11 DIAGNOSIS — S80862A Insect bite (nonvenomous), left lower leg, initial encounter: Secondary | ICD-10-CM | POA: Insufficient documentation

## 2015-03-11 DIAGNOSIS — W57XXXA Bitten or stung by nonvenomous insect and other nonvenomous arthropods, initial encounter: Secondary | ICD-10-CM | POA: Insufficient documentation

## 2015-03-11 DIAGNOSIS — Z23 Encounter for immunization: Secondary | ICD-10-CM | POA: Insufficient documentation

## 2015-03-11 DIAGNOSIS — E669 Obesity, unspecified: Secondary | ICD-10-CM | POA: Diagnosis not present

## 2015-03-11 DIAGNOSIS — Z79899 Other long term (current) drug therapy: Secondary | ICD-10-CM | POA: Diagnosis not present

## 2015-03-11 DIAGNOSIS — F329 Major depressive disorder, single episode, unspecified: Secondary | ICD-10-CM | POA: Insufficient documentation

## 2015-03-11 DIAGNOSIS — F419 Anxiety disorder, unspecified: Secondary | ICD-10-CM | POA: Diagnosis not present

## 2015-03-11 DIAGNOSIS — Y998 Other external cause status: Secondary | ICD-10-CM | POA: Diagnosis not present

## 2015-03-11 DIAGNOSIS — Z7982 Long term (current) use of aspirin: Secondary | ICD-10-CM | POA: Diagnosis not present

## 2015-03-11 DIAGNOSIS — S50862A Insect bite (nonvenomous) of left forearm, initial encounter: Secondary | ICD-10-CM | POA: Diagnosis not present

## 2015-03-11 DIAGNOSIS — Y92096 Garden or yard of other non-institutional residence as the place of occurrence of the external cause: Secondary | ICD-10-CM | POA: Insufficient documentation

## 2015-03-11 DIAGNOSIS — J449 Chronic obstructive pulmonary disease, unspecified: Secondary | ICD-10-CM | POA: Insufficient documentation

## 2015-03-11 DIAGNOSIS — Z862 Personal history of diseases of the blood and blood-forming organs and certain disorders involving the immune mechanism: Secondary | ICD-10-CM | POA: Diagnosis not present

## 2015-03-11 DIAGNOSIS — Y9389 Activity, other specified: Secondary | ICD-10-CM | POA: Diagnosis not present

## 2015-03-11 DIAGNOSIS — I2510 Atherosclerotic heart disease of native coronary artery without angina pectoris: Secondary | ICD-10-CM | POA: Diagnosis not present

## 2015-03-11 DIAGNOSIS — S60469A Insect bite (nonvenomous) of unspecified finger, initial encounter: Secondary | ICD-10-CM | POA: Diagnosis not present

## 2015-03-11 DIAGNOSIS — K219 Gastro-esophageal reflux disease without esophagitis: Secondary | ICD-10-CM | POA: Insufficient documentation

## 2015-03-11 DIAGNOSIS — Z8701 Personal history of pneumonia (recurrent): Secondary | ICD-10-CM | POA: Insufficient documentation

## 2015-03-11 DIAGNOSIS — M1990 Unspecified osteoarthritis, unspecified site: Secondary | ICD-10-CM | POA: Insufficient documentation

## 2015-03-11 DIAGNOSIS — Z8669 Personal history of other diseases of the nervous system and sense organs: Secondary | ICD-10-CM | POA: Insufficient documentation

## 2015-03-11 MED ORDER — DOXYCYCLINE HYCLATE 100 MG PO CAPS
100.0000 mg | ORAL_CAPSULE | Freq: Two times a day (BID) | ORAL | Status: DC
Start: 2015-03-11 — End: 2015-05-05

## 2015-03-11 MED ORDER — TETANUS-DIPHTH-ACELL PERTUSSIS 5-2.5-18.5 LF-MCG/0.5 IM SUSP
0.5000 mL | Freq: Once | INTRAMUSCULAR | Status: AC
  Administered 2015-03-11: 0.5 mL via INTRAMUSCULAR
  Filled 2015-03-11: qty 0.5

## 2015-03-11 MED ORDER — DOXYCYCLINE HYCLATE 100 MG PO TABS
100.0000 mg | ORAL_TABLET | Freq: Once | ORAL | Status: AC
  Administered 2015-03-11: 100 mg via ORAL
  Filled 2015-03-11: qty 1

## 2015-03-11 MED ORDER — MELOXICAM 15 MG PO TABS: 15.0000 mg | ORAL_TABLET | Freq: Every day | ORAL | Status: DC

## 2015-03-11 MED ORDER — KETOROLAC TROMETHAMINE 60 MG/2ML IM SOLN
60.0000 mg | Freq: Once | INTRAMUSCULAR | Status: AC
  Administered 2015-03-11: 60 mg via INTRAMUSCULAR
  Filled 2015-03-11: qty 2

## 2015-03-11 MED ORDER — PENTAFLUOROPROP-TETRAFLUOROETH EX AERO
INHALATION_SPRAY | CUTANEOUS | Status: AC
  Administered 2015-03-11: 30
  Filled 2015-03-11: qty 30

## 2015-03-11 NOTE — ED Provider Notes (Signed)
CSN: [id number]     Arrival date & time 03/11/15  0040 History   First MD Initiated Contact with Patient 03/11/15 [phone]     Chief Complaint  Patient presents with  . Insect Bite     (Consider location/radiation/quality/duration/timing/severity/associated sxs/prior Treatment) [name] presenting with animal bite. The history is provided by the patient.  Animal Bite Contact animal:  Insect Location:  Shoulder/arm and leg Shoulder/arm injury location:  L forearm and L fingers Leg injury location:  L lower leg Time since incident:  15 hours Pain details:    Quality:  Aching   Severity:  Severe   Timing:  Constant   Progression:  Unchanged Incident location:  Outside Notifications:  None Tetanus status:  Unknown Relieved by:  Nothing Worsened by:  Nothing tried Ineffective treatments:  Cold compresses Associated symptoms: no fever and no swelling   Thinks it was a caterpillar but never saw the insect.  No snakes, no roses in the yard.    Past Medical History  Diagnosis Date  . Asthma   . Obesity   . Maxillary sinusitis   . Osteoarthritis   . IBS (irritable bowel syndrome)   . Hyperlipidemia   . COPD (chronic obstructive pulmonary disease)   . Pulmonary nodules   . Osteoporosis   . GERD (gastroesophageal reflux disease)   . H/O hiatal hernia   . Depression     mild  . Allergic bronchopulmonary aspergillosis 2008    sees Dr Edmund Hilda pulmonology  . Anemia     iron deficiency, resolved  . Normal cardiac stress test 11/2011    No evidence of ischemia or infarct. Calculated ejection fraction 72%.  . OSA (obstructive sleep apnea) 02/2012    has stopped using  cpap  . Anxiety   . Pneumonia 11/2011    "before 2013 I hadn't had pneumonia since I was a child" (04/13/2012)  . Schatzki's ring   . History of echocardiogram     Echo 6/16:  Mod LVH, EF 60-65%, no RWMA, Gr 1 DD, trivial MR, normal LA size.  Marland Kitchen CAD (coronary artery disease)     a. LHC  6/16:  oOM1 60, pRCA 25 >> med Rx   Past Surgical History  Procedure Laterality Date  . Cesarean section  1985  . Hernia repair  04/13/2012    VHR laparoscopic  . Appendectomy  1989  . Ventral hernia repair  04/13/2012    Procedure: LAPAROSCOPIC VENTRAL HERNIA;  Surgeon: Adin Hector, MD;  Location: [place];  Service: General;  Laterality: N/A;  laparoscopic repair of incarcerated hernia  . Cardiac catheterization N/A 11/25/2014    Procedure: Right/Left Heart Cath and Coronary Angiography;  Surgeon: Belva Crome, MD;  Location: [place] [facility];  Service: Cardiovascular;  Laterality: N/A;   Family History  Problem Relation Age of Onset  . Breast cancer Mother   . Diverticulosis Father   . Prostate cancer Father     prostate  . Pulmonary embolism Brother     recurrent  . Breast cancer Sister     breast  . Heart attack Maternal Grandfather   . Stroke Neg Hx   . Hypertension Mother   . Diabetes Mother    Social History  Substance Use Topics  . Smoking status: Never Smoker   . Smokeless tobacco: Never Used  . Alcohol Use: 2.4 oz/week    4 Glasses of wine per week     Comment: 04/13/2012 "beer  in the summer; wine 3-4 times/wk"   OB History    No data available     Review of Systems  Constitutional: Negative for fever.  All other systems reviewed and are negative.     Allergies  Beclomethasone dipropionate; Budesonide-formoterol fumarate; Mometasone furo-formoterol fum; Sulfonamide derivatives; and Statins  Home Medications   Prior to Admission medications   Medication Sig Start Date End Date Taking? Authorizing Provider  ALPRAZolam Duanne Moron) 0.5 MG tablet Take 1 tablet (0.5 mg total) by mouth 2 (two) times daily as needed for anxiety (Palpatations, SOB). 01/16/15   Mosie Lukes, MD  aspirin 81 MG tablet Take 81 mg by mouth daily.     Historical Provider, MD  azithromycin (ZITHROMAX) 250 MG tablet 2 tab po day 1 then 1 tab po x 4 days 02/26/15   Mackie Pai, PA-C    Cholecalciferol (VITAMIN D) 2000 UNITS CAPS Take 2,000 Units by mouth daily.     Historical Provider, MD  Coenzyme Q10 (CO Q 10 PO) Take 200 mg by mouth 2 (two) times daily.    Historical Provider, MD  ezetimibe (ZETIA) 10 MG tablet Take 1 tablet (10 mg total) by mouth daily. 12/11/14   Liliane Shi, PA-C  fexofenadine (ALLEGRA) 180 MG tablet Take 180 mg by mouth daily as needed for allergies.     Historical Provider, MD  furosemide (LASIX) 20 MG tablet Take 1 tablet (20 mg total) by mouth daily as needed. 11/17/14   Reyne Dumas, MD  HYDROcodone-homatropine (HYCODAN) 5-1.5 MG/5ML syrup Take 5 mLs by mouth every 6 (six) hours as needed for cough. 11/09/14   Reyne Dumas, MD  ibuprofen (ADVIL,MOTRIN) 200 MG tablet Take 200 mg by mouth every 6 (six) hours as needed for mild pain or moderate pain.    Historical Provider, MD  ipratropium-albuterol (DUONEB) 0.5-2.5 (3) MG/3ML SOLN Inhale 3 mLs into the lungs 4 (four) times daily as needed. Patient taking differently: Inhale 3 mLs into the lungs 4 (four) times daily as needed (wheezing or shortness of breath).  05/19/14   Brand Males, MD  mometasone (ASMANEX 60 METERED DOSES) 220 MCG/INH inhaler USE 2 INHALATIONS ORALLY EVERY EVENING 02/27/15   Brand Males, MD  montelukast (SINGULAIR) 10 MG tablet TAKE 1 TABLET BY MOUTH TWICE DAILY 02/03/15   Brand Males, MD  nitroGLYCERIN (NITROSTAT) 0.4 MG SL tablet Place 1 tablet (0.4 mg total) under the tongue every 5 (five) minutes as needed for chest pain. 11/18/14   Mosie Lukes, MD  omega-3 acid ethyl esters (LOVAZA) 1 G capsule Take 1 g by mouth 2 (two) times daily.    Historical Provider, MD  Omeprazole-Sodium Bicarbonate (ZEGERID OTC PO) Take 1 tablet by mouth daily.    Historical Provider, MD  predniSONE (DELTASONE) 20 MG tablet 1 tab po bid x day 1 and day 2, then 1 tab po day 3 and day 4. 02/26/15   Mackie Pai, PA-C  ranitidine (ZANTAC) 300 MG tablet Take 1 tablet (300 mg total) by mouth at  bedtime. 04/23/14   Mosie Lukes, MD  tiotropium (SPIRIVA HANDIHALER) 18 MCG inhalation capsule INHALE THE CONTENTS OF ONE CAPSULE DAILY 02/27/15   Brand Males, MD  triamcinolone (NASACORT AQ) 55 MCG/ACT AERO nasal inhaler Place 2 sprays into the nose daily as needed (for allergies).     Historical Provider, MD  venlafaxine XR (EFFEXOR-XR) 150 MG 24 hr capsule Take 150 mg by mouth daily with breakfast.    Historical Provider, MD  BP 151/86 mmHg  Pulse 84  Temp(Src) 98.1 F (36.7 C) (Oral)  Resp 18  Ht 5\' 3"  (1.6 m)  Wt 167 lb (75.751 kg)  BMI 29.59 kg/m2  SpO2 100% Physical Exam  Constitutional: She is oriented to person, place, and time. She appears well-developed and well-nourished. No distress.  HENT:  Head: Normocephalic and atraumatic.  Mouth/Throat: Oropharynx is clear and moist.  Eyes: Conjunctivae are normal. Pupils are equal, round, and reactive to light.  Neck: Normal range of motion. Neck supple.  Cardiovascular: Normal rate, regular rhythm and intact distal pulses.   Pulmonary/Chest: Effort normal and breath sounds normal. No respiratory distress. She has no wheezes. She has no rales.  Abdominal: Soft. Bowel sounds are normal. There is no tenderness. There is no rebound and no guarding.  Musculoskeletal: Normal range of motion. She exhibits no edema or tenderness.  Neurological: She is alert and oriented to person, place, and time. She has normal reflexes.  Skin: Skin is warm and dry. No petechiae and no purpura noted. Rash is not papular, not nodular, not vesicular and not urticarial. No erythema.     Psychiatric: She has a normal mood and affect.    ED Course  Procedures (including critical care time) Labs Review Labs Reviewed - No data to display  Imaging Review No results found. I have personally reviewed and evaluated these images and lab results as part of my medical decision-making.   EKG Interpretation None      MDM   Final diagnoses:  None      Attempted to use tape to remove caterpillar hairs and then applied isopropyl alcohol copiously to lesions followed by pain ease spray as patient believes these are caterpillar bites.  The treatment for caterpillar bites did nothing for patient's symptoms and as we are very late in the season, these are likely some other insect.  No punctures to indicate snake bites.  Intact motor and sensory LUE NVI. Given halo around lesion will refer for tick borne illness testing and start doxycycline treatment.  Continue to ice and elevate the extremity.       Veatrice Kells, MD 03/11/15 [phone]

## 2015-03-11 NOTE — ED Notes (Signed)
Pt states she got a bug bite around noon, started turning red on her arm

## 2015-03-11 NOTE — ED Notes (Signed)
Pt verbalizes understanding of d/c instructions and denies any further needs at this time. 

## 2015-03-12 ENCOUNTER — Ambulatory Visit (HOSPITAL_BASED_OUTPATIENT_CLINIC_OR_DEPARTMENT_OTHER)
Admission: RE | Admit: 2015-03-12 | Discharge: 2015-03-12 | Disposition: A | Discharge status: Home / Self Care | Payer: Commercial Managed Care - HMO | Source: Ambulatory Visit | Attending: Family Medicine | Admitting: Family Medicine

## 2015-03-12 ENCOUNTER — Encounter: Payer: Self-pay | Admitting: Medical

## 2015-03-12 ENCOUNTER — Ambulatory Visit (INDEPENDENT_AMBULATORY_CARE_PROVIDER_SITE_OTHER): Payer: Commercial Managed Care - HMO | Admitting: Medical

## 2015-03-12 VITALS — BP 116/80 | HR 95 | Temp 97.8°F | Ht 62.0 in | Wt 169.0 lb

## 2015-03-12 DIAGNOSIS — M4185 Other forms of scoliosis, thoracolumbar region: Secondary | ICD-10-CM | POA: Diagnosis not present

## 2015-03-12 DIAGNOSIS — M549 Dorsalgia, unspecified: Secondary | ICD-10-CM | POA: Insufficient documentation

## 2015-03-12 DIAGNOSIS — M419 Scoliosis, unspecified: Secondary | ICD-10-CM

## 2015-03-12 DIAGNOSIS — K449 Diaphragmatic hernia without obstruction or gangrene: Secondary | ICD-10-CM | POA: Insufficient documentation

## 2015-03-12 DIAGNOSIS — T148 Other injury of unspecified body region: Secondary | ICD-10-CM | POA: Diagnosis not present

## 2015-03-12 DIAGNOSIS — W57XXXA Bitten or stung by nonvenomous insect and other nonvenomous arthropods, initial encounter: Secondary | ICD-10-CM

## 2015-03-12 DIAGNOSIS — M546 Pain in thoracic spine: Secondary | ICD-10-CM

## 2015-03-12 NOTE — Progress Notes (Signed)
Subjective:    Patient ID: Victoria [name], female    DOB: Nov 27, 1955, 59 y.o.   MRN: [id number]  HPI  Pt in states she got bit by something when she was trimming her bushes on tuesday. ED made some notes to effect removing catepillar hairs.  No puncture wounds to indicate snake bite. Pt bite occurred on Tuesday. She had some mild achiness in joints of left upper extremity. On Tuesday she had achiness in elbow, shoulder and hand. Some on wed. But today pain in arm is gone. The possible bite area dose not itch.   Pt got doxycycline for possible tick bite. At time of ED visit she had  bulls eye type of rash appearance.  ED thought she might need tick bite studies.  Pt also mentioned that her wheezing and bronchitis signs and symptoms have resolved.      Review of Systems  Constitutional: Negative for fever, chills, diaphoresis, activity change and fatigue.  Eyes: Negative for visual disturbance.  Respiratory: Negative for cough, chest tightness and shortness of breath.   Cardiovascular: Negative for chest pain, palpitations and leg swelling.  Gastrointestinal: Negative for nausea, vomiting and abdominal pain.  Musculoskeletal: Negative for neck pain and neck stiffness.  Neurological: Negative for dizziness, tremors, seizures, syncope, facial asymmetry, speech difficulty, weakness, light-headedness, numbness and headaches.       Very faint low grade ha.  Hematological: Negative for adenopathy. Does not bruise/bleed easily.  Psychiatric/Behavioral: Negative for behavioral problems, confusion and agitation. The patient is not nervous/anxious.     Past Medical History  Diagnosis Date  . Asthma   . Obesity   . Maxillary sinusitis   . Osteoarthritis   . IBS (irritable bowel syndrome)   . Hyperlipidemia   . COPD (chronic obstructive pulmonary disease)   . Pulmonary nodules   . Osteoporosis   . GERD (gastroesophageal reflux disease)   . H/O hiatal hernia   . Depression     mild  .  Allergic bronchopulmonary aspergillosis 2008    sees Dr Edmund Hilda pulmonology  . Anemia     iron deficiency, resolved  . Normal cardiac stress test 11/2011    No evidence of ischemia or infarct. Calculated ejection fraction 72%.  . OSA (obstructive sleep apnea) 02/2012    has stopped using  cpap  . Anxiety   . Pneumonia 11/2011    "before 2013 I hadn't had pneumonia since I was a child" (04/13/2012)  . Schatzki's ring   . History of echocardiogram     Echo 6/16:  Mod LVH, EF 60-65%, no RWMA, Gr 1 DD, trivial MR, normal LA size.  Marland Kitchen CAD (coronary artery disease)     a. LHC 6/16:  oOM1 60, pRCA 25 >> med Rx    Social History   Social History  . Marital Status: Married    Spouse Name: N/A  . Number of Children: 1  . Years of Education: N/A   Occupational History  .     Social History Main Topics  . Smoking status: Never Smoker   . Smokeless tobacco: Never Used  . Alcohol Use: 2.4 oz/week    4 Glasses of wine per week     Comment: 04/13/2012 "beer in the summer; wine 3-4 times/wk"  . Drug Use: No  . Sexual Activity: Yes     Comment: gluten free, lives with husband and son with CP quadriplegia   Other Topics Concern  . Not on file   Social History Narrative  Cares for a 48yo son with cerebral palsy.     Past Surgical History  Procedure Laterality Date  . Cesarean section  1985  . Hernia repair  04/13/2012    VHR laparoscopic  . Appendectomy  1989  . Ventral hernia repair  04/13/2012    Procedure: LAPAROSCOPIC VENTRAL HERNIA;  Surgeon: Adin Hector, MD;  Location: [facility];  Service: General;  Laterality: N/A;  laparoscopic repair of incarcerated hernia  . Cardiac catheterization N/A 11/25/2014    Procedure: Right/Left Heart Cath and Coronary Angiography;  Surgeon: Belva Crome, MD;  Location: [facility];  Service: Cardiovascular;  Laterality: N/A;    Family History  Problem Relation Age of Onset  . Breast cancer Mother   . Diverticulosis Father    . Prostate cancer Father     prostate  . Pulmonary embolism Brother     recurrent  . Breast cancer Sister     breast  . Heart attack Maternal Grandfather   . Stroke Neg Hx   . Hypertension Mother   . Diabetes Mother     Allergies  Allergen Reactions  . Beclomethasone Dipropionate Hives and Other (See Comments)     weight gain  . Budesonide-Formoterol Fumarate Hives  . Mometasone Furo-Formoterol Fum Hives and Other (See Comments)    weight gain  . Sulfonamide Derivatives Hives and Rash  . Statins     Myalgias, RLS    Current Outpatient Prescriptions on File Prior to Visit  Medication Sig Dispense Refill  . ALPRAZolam (XANAX) 0.5 MG tablet Take 1 tablet (0.5 mg total) by mouth 2 (two) times daily as needed for anxiety (Palpatations, SOB). 30 tablet 1  . aspirin 81 MG tablet Take 81 mg by mouth daily.     Marland Kitchen azithromycin (ZITHROMAX) 250 MG tablet 2 tab po day 1 then 1 tab po x 4 days 6 tablet 0  . Cholecalciferol (VITAMIN D) 2000 UNITS CAPS Take 2,000 Units by mouth daily.     . Coenzyme Q10 (CO Q 10 PO) Take 200 mg by mouth 2 (two) times daily.    Marland Kitchen doxycycline (VIBRAMYCIN) 100 MG capsule Take 1 capsule (100 mg total) by mouth 2 (two) times daily. One po bid x 7 days 14 capsule 0  . ezetimibe (ZETIA) 10 MG tablet Take 1 tablet (10 mg total) by mouth daily. 30 tablet 6  . fexofenadine (ALLEGRA) 180 MG tablet Take 180 mg by mouth daily as needed for allergies.     . furosemide (LASIX) 20 MG tablet Take 1 tablet (20 mg total) by mouth daily as needed. 30 tablet 1  . HYDROcodone-homatropine (HYCODAN) 5-1.5 MG/5ML syrup Take 5 mLs by mouth every 6 (six) hours as needed for cough. 240 mL 0  . ibuprofen (ADVIL,MOTRIN) 200 MG tablet Take 200 mg by mouth every 6 (six) hours as needed for mild pain or moderate pain.    Marland Kitchen ipratropium-albuterol (DUONEB) 0.5-2.5 (3) MG/3ML SOLN Inhale 3 mLs into the lungs 4 (four) times daily as needed. (Patient taking differently: Inhale 3 mLs into the lungs  4 (four) times daily as needed (wheezing or shortness of breath). ) 360 mL 5  . meloxicam (MOBIC) 15 MG tablet Take 1 tablet (15 mg total) by mouth daily. 10 tablet 0  . mometasone (ASMANEX 60 METERED DOSES) 220 MCG/INH inhaler USE 2 INHALATIONS ORALLY EVERY EVENING 3 Inhaler 1  . montelukast (SINGULAIR) 10 MG tablet TAKE 1 TABLET BY MOUTH TWICE DAILY 180 tablet 1  .  nitroGLYCERIN (NITROSTAT) 0.4 MG SL tablet Place 1 tablet (0.4 mg total) under the tongue every 5 (five) minutes as needed for chest pain. 25 tablet 0  . omega-3 acid ethyl esters (LOVAZA) 1 G capsule Take 1 g by mouth 2 (two) times daily.    Earney Navy Bicarbonate (ZEGERID OTC PO) Take 1 tablet by mouth daily.    . predniSONE (DELTASONE) 20 MG tablet 1 tab po bid x day 1 and day 2, then 1 tab po day 3 and day 4. 6 tablet 0  . ranitidine (ZANTAC) 300 MG tablet Take 1 tablet (300 mg total) by mouth at bedtime. 90 tablet 1  . tiotropium (SPIRIVA HANDIHALER) 18 MCG inhalation capsule INHALE THE CONTENTS OF ONE CAPSULE DAILY 90 capsule 1  . triamcinolone (NASACORT AQ) 55 MCG/ACT AERO nasal inhaler Place 2 sprays into the nose daily as needed (for allergies).     . venlafaxine XR (EFFEXOR-XR) 150 MG 24 hr capsule Take 150 mg by mouth daily with breakfast.     No current facility-administered medications on file prior to visit.    BP 116/80 mmHg  Pulse 95  Temp(Src) 97.8 F (36.6 C) (Oral)  Ht 5\' 2"  (1.575 m)  Wt 169 lb (76.658 kg)  BMI 30.90 kg/m2  SpO2 98%       Objective:   Physical Exam  General- No acute distress. Pleasant patient. Neck- Full range of motion, no jvd Lungs- Clear, even and unlabored. Heart- regular rate and rhythm. Neurologic- CNII- XII grossly intact.  Skin- left arm mild 9 mm faint rash. No redness or tenderness.(no bulls eye appearance presently  But other day did have appearance.)      Assessment & Plan:  By exam, review of your picture you took yesterday you may have contacted  caterpillar but at same time the bulls eye rash and joint pain introduces lyme into differential. So please continue doxycycline and meloxicam.  Will order tick bite studies.  Your bronchitis is resolved. No further tx needed for this.  Follow up in 7-10 days or prn any residual rash or other symptoms.

## 2015-03-12 NOTE — Patient Instructions (Signed)
By exam, review of your picture you took yesterday you may have contacted caterpillar but at same time the bulls eye rash and joint pain introduces lyme into differential. So please continue doxycycline and meloxicam.  Will order tick bite studies.  Your bronchitis is resolved. No further tx needed for this.  Follow up in 7-10 days or prn any residual rash or other symptoms.

## 2015-03-12 NOTE — Progress Notes (Signed)
Pre visit review using our clinic review tool, if applicable. No additional management support is needed unless otherwise documented below in the visit note. 

## 2015-03-13 LAB — LYME ABY, WSTRN BLT IGG & IGM W/BANDS
B BURGDORFERI IGG ABS (IB): NEGATIVE
B burgdorferi IgM Abs (IB): NEGATIVE
LYME DISEASE 18 KD IGG: NONREACTIVE
LYME DISEASE 23 KD IGG: NONREACTIVE
LYME DISEASE 28 KD IGG: NONREACTIVE
LYME DISEASE 39 KD IGM: NONREACTIVE
LYME DISEASE 41 KD IGG: NONREACTIVE
LYME DISEASE 66 KD IGG: NONREACTIVE
Lyme Disease 23 kD IgM: NONREACTIVE
Lyme Disease 30 kD IgG: NONREACTIVE
Lyme Disease 39 kD IgG: NONREACTIVE
Lyme Disease 41 kD IgM: NONREACTIVE
Lyme Disease 45 kD IgG: NONREACTIVE
Lyme Disease 58 kD IgG: NONREACTIVE
Lyme Disease 93 kD IgG: NONREACTIVE

## 2015-03-16 LAB — ROCKY MTN SPOTTED FVR ABS PNL(IGG+IGM)
RMSF IGM: 0.37 IV
RMSF IgG: 0.18 IV

## 2015-03-17 ENCOUNTER — Encounter: Payer: Self-pay | Admitting: Family Medicine

## 2015-03-17 ENCOUNTER — Encounter: Payer: Self-pay | Admitting: Internal Medicine

## 2015-03-17 NOTE — Telephone Encounter (Signed)
Attempted to call patient-unable to reach her. I therefore sent e-mail asking her to share her concerns/questions about not wanting to start Nucala Injections.

## 2015-03-17 NOTE — Telephone Encounter (Signed)
Per pt email:  I received a letter from [employer] for Victoria [name] as you did, and I do NOT want to proceed any further and do NOT want to begin treatment with Nucala   Will forward to Dr. Chase Caller as an Juluis Rainier along with [facility].

## 2015-03-18 ENCOUNTER — Other Ambulatory Visit: Payer: Self-pay | Admitting: Family Medicine

## 2015-03-18 MED ORDER — VENLAFAXINE HCL ER 150 MG PO CP24: 150.0000 mg | ORAL_CAPSULE | Freq: Every day | ORAL | Status: DC

## 2015-03-23 ENCOUNTER — Telehealth: Payer: Self-pay | Admitting: Internal Medicine

## 2015-03-23 NOTE — Telephone Encounter (Signed)
Forms have been received, in PA folder. Patient has decided not to proceed with Nucala at this time. Victoria [name] is speaking to patient about her request to decline Nucala. Will hold forms in PA folder until decision has been finalized.   Called Debbie at [facility] and advised her that we are putting hold on the prescription until we call them back and let them know what to do with it.  To [name] for follow up/

## 2015-03-24 NOTE — Telephone Encounter (Signed)
MR- I have reached out to patient to see what her concerns/questions were that may have helped her decide not to start Nucala; no response from patient. I will close her case out and can re-open if needed. Thanks.

## 2015-03-26 NOTE — Telephone Encounter (Signed)
[name] please advise if this message can be closed.  Thanks!  

## 2015-04-12 ENCOUNTER — Encounter: Payer: Self-pay | Admitting: Internal Medicine

## 2015-04-13 NOTE — Telephone Encounter (Signed)
[name] please advise. Thanks. 

## 2015-05-05 ENCOUNTER — Encounter: Payer: Self-pay | Admitting: Internal Medicine

## 2015-05-05 ENCOUNTER — Ambulatory Visit (INDEPENDENT_AMBULATORY_CARE_PROVIDER_SITE_OTHER): Payer: Commercial Managed Care - HMO | Admitting: Internal Medicine

## 2015-05-05 VITALS — BP 112/78 | HR 88 | Ht 62.0 in | Wt 167.0 lb

## 2015-05-05 DIAGNOSIS — J82 Pulmonary eosinophilia, not elsewhere classified: Secondary | ICD-10-CM | POA: Diagnosis not present

## 2015-05-05 DIAGNOSIS — J4550 Severe persistent asthma, uncomplicated: Secondary | ICD-10-CM | POA: Diagnosis not present

## 2015-05-05 DIAGNOSIS — B4481 Allergic bronchopulmonary aspergillosis: Secondary | ICD-10-CM

## 2015-05-05 DIAGNOSIS — J8283 Eosinophilic asthma: Secondary | ICD-10-CM

## 2015-05-05 DIAGNOSIS — Z23 Encounter for immunization: Secondary | ICD-10-CM

## 2015-05-05 LAB — NITRIC OXIDE: Nitric Oxide: 80

## 2015-05-05 NOTE — Addendum Note (Signed)
Addended by: Maurice March on: 05/05/2015 04:51 PM   Modules accepted: Orders

## 2015-05-05 NOTE — Progress Notes (Signed)
Subjective:     Patient ID: Victoria [name], female   DOB: Sep 15, 1955, 59 y.o.   MRN: [id number]  HPI    OV 05/05/2015  Chief Complaint  Patient presents with  . Follow-up    Pt states her breathing is doing well. Pt states her SOB is at baseline. Pt c/o mild dry cough and mild wheezing. Pt denies CP/tightness.     Follow-up severe persistent asthma + ABPA + plus peripheral eosinophilia in setting of moderate size hiatal hernia   Presents for asthma follow-up. Last seen Aug 2016. At this point in time she is doing well. In May 2016 had right lower lobe pneumonia. Follow-up chest x-ray 01/16/2015 showed significant clearing other than right lower lobe scarring. She does NOT want cxr today for fu due to high out  Of poicket cost $95.  Currently maintained on Allegra, DuoNeb, Asmanex, Singulair, Zegerid, Spiriva and Nasacort for asthma. Asthma is under good control subjectively and unchanged. ACQ5. questionnaire is 2.0 and up from 1.0 last visit suggesting NOT so good  control of asthma. She wants new neb machine. She wants flu shot. She rejected Nucala due to fear of cost.  REmains off prednisone without problems  n the middle of the night she is waking up a few times for asthma , she wakes up in the morning she has MILD symptoms. In the day she is only very slightly limited with her activities because of asthma and only experiences dyspnea y little and has wheezed only little of the time without using any albuterol for rescue the past one week.  However, FeNO elevated 80ppb and first time we are doing it  Social: She is on [employer] and she does not like it. She is wanting to switch to [employer]  Current outpatient prescriptions:  .  ALPRAZolam (XANAX) 0.5 MG tablet, Take 1 tablet (0.5 mg total) by mouth 2 (two) times daily as needed for anxiety (Palpatations, SOB)., Disp: 30 tablet, Rfl: 1 .  aspirin 81 MG tablet, Take 81 mg by mouth daily. , Disp: , Rfl:  .  Cholecalciferol  (VITAMIN D) 2000 UNITS CAPS, Take 2,000 Units by mouth daily. , Disp: , Rfl:  .  Coenzyme Q10 (CO Q 10 PO), Take 200 mg by mouth 2 (two) times daily., Disp: , Rfl:  .  ezetimibe (ZETIA) 10 MG tablet, Take 1 tablet (10 mg total) by mouth daily., Disp: 30 tablet, Rfl: 6 .  fexofenadine (ALLEGRA) 180 MG tablet, Take 180 mg by mouth daily as needed for allergies. , Disp: , Rfl:  .  furosemide (LASIX) 20 MG tablet, Take 1 tablet (20 mg total) by mouth daily as needed., Disp: 30 tablet, Rfl: 1 .  HYDROcodone-homatropine (HYCODAN) 5-1.5 MG/5ML syrup, Take 5 mLs by mouth every 6 (six) hours as needed for cough., Disp: 240 mL, Rfl: 0 .  ibuprofen (ADVIL,MOTRIN) 200 MG tablet, Take 200 mg by mouth every 6 (six) hours as needed for mild pain or moderate pain., Disp: , Rfl:  .  ipratropium-albuterol (DUONEB) 0.5-2.5 (3) MG/3ML SOLN, Inhale 3 mLs into the lungs 4 (four) times daily as needed. (Patient taking differently: Inhale 3 mLs into the lungs 4 (four) times daily as needed (wheezing or shortness of breath). ), Disp: 360 mL, Rfl: 5 .  mometasone (ASMANEX 60 METERED DOSES) 220 MCG/INH inhaler, USE 2 INHALATIONS ORALLY EVERY EVENING, Disp: 3 Inhaler, Rfl: 1 .  montelukast (SINGULAIR) 10 MG tablet, TAKE 1 TABLET BY MOUTH TWICE DAILY, Disp: 180 tablet,  Rfl: 1 .  nitroGLYCERIN (NITROSTAT) 0.4 MG SL tablet, Place 1 tablet (0.4 mg total) under the tongue every 5 (five) minutes as needed for chest pain., Disp: 25 tablet, Rfl: 0 .  Omeprazole-Sodium Bicarbonate (ZEGERID OTC PO), Take 1 tablet by mouth daily., Disp: , Rfl:  .  tiotropium (SPIRIVA HANDIHALER) 18 MCG inhalation capsule, INHALE THE CONTENTS OF ONE CAPSULE DAILY, Disp: 90 capsule, Rfl: 1 .  venlafaxine XR (EFFEXOR-XR) 150 MG 24 hr capsule, Take 1 capsule (150 mg total) by mouth daily with breakfast., Disp: 90 capsule, Rfl: 1  Allergies  Allergen Reactions  . Beclomethasone Dipropionate Hives and Other (See Comments)     weight gain  .  Budesonide-Formoterol Fumarate Hives  . Mometasone Furo-Formoterol Fum Hives and Other (See Comments)    weight gain  . Sulfonamide Derivatives Hives and Rash  . Statins     Myalgias, RLS      Review of Systems Per HPI    Objective:   Physical Exam  Constitutional: She is oriented to person, place, and time. She appears well-developed and well-nourished. No distress.  HENT:  Head: Normocephalic and atraumatic.  Right Ear: External ear normal.  Left Ear: External ear normal.  Mouth/Throat: Oropharynx is clear and moist. No oropharyngeal exudate.  Eyes: Conjunctivae and EOM are normal. Pupils are equal, round, and reactive to light. Right eye exhibits no discharge. Left eye exhibits no discharge. No scleral icterus.  Neck: Normal range of motion. Neck supple. No JVD present. No tracheal deviation present. No thyromegaly present.  Cardiovascular: Normal rate, regular rhythm, normal heart sounds and intact distal pulses.  Exam reveals no gallop and no friction rub.   No murmur heard. Pulmonary/Chest: Effort normal and breath sounds normal. No respiratory distress. She has no wheezes. She has no rales. She exhibits no tenderness.  Abdominal: Soft. Bowel sounds are normal. She exhibits no distension and no mass. There is no tenderness. There is no rebound and no guarding.  Musculoskeletal: Normal range of motion. She exhibits no edema or tenderness.  Lymphadenopathy:    She has no cervical adenopathy.  Neurological: She is alert and oriented to person, place, and time. She has normal reflexes. No cranial nerve deficit. She exhibits normal muscle tone. Coordination normal.  Skin: Skin is warm and dry. No rash noted. She is not diaphoretic. No erythema. No pallor.  Psychiatric: She has a normal mood and affect. Her behavior is normal. Judgment and thought content normal.  Vitals reviewed.   Filed Vitals:   05/05/15 1341  BP: 112/78  Pulse: 88  Height: 5\' 2"  (1.575 m)  Weight: 167 lb  (75.751 kg)  SpO2: 98%        Assessment:       ICD-9-CM ICD-10-CM   1. 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[place]) Q000111Q J82 Ambulatory Referral for DME  2. Asthma, severe persistent, uncomplicated 123456 [id number] Ambulatory Referral for DME  3. A B P A-ALLERGIC BRONCHOPULMONARY ASPERGILLOSIS 518.6 B44.81 Ambulatory Referral for DME       Plan:      #Asthma + ABPA with bronchiectassis + eosinophilia -stable disease - high FENO ? Baseline  PLAN - redo FeNO next visit - glad you're able to maintain right now without chronic prednisone  - discussed step up Rx with pred or nucala due to high FeNO but she wants to hold of - Continue Allegra plus DuoNeb plus Asmanex plus Singulair plus Zegerid plus Spiriva plus Nasacort - Respect desire to refuse Nucala - Respect refusal for followup  CXR of pneumonia May 2016 - High dose flu shot 05/05/2015 - Meet with [facility] to look at getting neb machine through DME company  #Followup  - 6 months  -  FeNO and ACQ at [place]  -  Dr. Brand Males, M.D., [facility].C.P Pulmonary and Critical Care Medicine Staff Physician [facility] Pager: [phone], If no answer or between  15:00h - 7:00h: call [phone]  05/05/2015 2:04 PM

## 2015-05-05 NOTE — Patient Instructions (Addendum)
ICD-9-CM ICD-10-CM   1. 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[place]) Q000111Q J82   2. Asthma, severe persistent, uncomplicated 123456 [id number]   3. A B P A-ALLERGIC BRONCHOPULMONARY ASPERGILLOSIS 518.6 B44.81      #Asthma + ABPA with bronchiectassis + eosinophilia -stable disease - glad you're able to maintain right now without chronic prednisone - Continue Allegra plus DuoNeb plus Asmanex plus Singulair plus Zegerid plus Spiriva plus Nasacort - Respect desire to refuse Nucala - Respect refusal for followup CXR of pneumonia May 2016 - High dose flu shot 05/05/2015 - Meet with [facility] [place] to look at getting neb machine through DME company  #Followup  - 6 months  -  FeNO and ACQ at [place]  -

## 2015-05-06 ENCOUNTER — Encounter: Payer: Self-pay | Admitting: Internal Medicine

## 2015-05-06 MED ORDER — MONTELUKAST SODIUM 10 MG PO TABS: ORAL_TABLET | ORAL | Status: DC

## 2015-05-06 MED ORDER — MOMETASONE FUROATE 220 MCG/INH IN AEPB: INHALATION_SPRAY | RESPIRATORY_TRACT | Status: DC

## 2015-05-08 ENCOUNTER — Encounter: Payer: Self-pay | Admitting: *Deleted

## 2015-05-12 ENCOUNTER — Encounter: Payer: Self-pay | Admitting: Family Medicine

## 2015-05-12 ENCOUNTER — Ambulatory Visit (INDEPENDENT_AMBULATORY_CARE_PROVIDER_SITE_OTHER): Payer: Commercial Managed Care - HMO | Admitting: Family Medicine

## 2015-05-12 VITALS — BP 120/82 | HR 81 | Temp 98.3°F | Ht 62.0 in | Wt 167.0 lb

## 2015-05-12 DIAGNOSIS — E782 Mixed hyperlipidemia: Secondary | ICD-10-CM | POA: Diagnosis not present

## 2015-05-12 DIAGNOSIS — K2100 Gastro-esophageal reflux disease with esophagitis, without bleeding: Secondary | ICD-10-CM

## 2015-05-12 DIAGNOSIS — E785 Hyperlipidemia, unspecified: Secondary | ICD-10-CM

## 2015-05-12 DIAGNOSIS — D721 Eosinophilia, unspecified: Secondary | ICD-10-CM

## 2015-05-12 DIAGNOSIS — J8283 Eosinophilic asthma: Secondary | ICD-10-CM

## 2015-05-12 DIAGNOSIS — M4120 Other idiopathic scoliosis, site unspecified: Secondary | ICD-10-CM

## 2015-05-12 DIAGNOSIS — I517 Cardiomegaly: Secondary | ICD-10-CM

## 2015-05-12 DIAGNOSIS — E669 Obesity, unspecified: Secondary | ICD-10-CM

## 2015-05-12 DIAGNOSIS — J82 Pulmonary eosinophilia, not elsewhere classified: Secondary | ICD-10-CM

## 2015-05-12 LAB — COMPREHENSIVE METABOLIC PANEL
ALBUMIN: 4.5 g/dL (ref 3.5–5.2)
ALT: 23 U/L (ref 0–35)
AST: 20 U/L (ref 0–37)
Alkaline Phosphatase: 101 U/L (ref 39–117)
BILIRUBIN TOTAL: 0.3 mg/dL (ref 0.2–1.2)
BUN: 13 mg/dL (ref 6–23)
CO2: 30 meq/L (ref 19–32)
Calcium: 10.1 mg/dL (ref 8.4–10.5)
Chloride: 103 mEq/L (ref 96–112)
Creatinine, Ser: 0.68 mg/dL (ref 0.40–1.20)
GFR: 94.45 mL/min (ref >60.00)
GLUCOSE: 108 mg/dL — AB (ref 70–99)
Potassium: 4.2 mEq/L (ref 3.5–5.1)
SODIUM: 143 meq/L (ref 135–145)
TOTAL PROTEIN: 7.5 g/dL (ref 6.0–8.3)

## 2015-05-12 LAB — LIPID PANEL
CHOL/HDL RATIO: 4
Cholesterol: 239 mg/dL — ABNORMAL HIGH (ref 0–200)
HDL: 54.2 mg/dL (ref >39.00)
LDL Cholesterol: 162 mg/dL — ABNORMAL HIGH (ref 0–99)
NonHDL: 184.59
Triglycerides: 112 mg/dL (ref 0.0–149.0)
VLDL: 22.4 mg/dL (ref 0.0–40.0)

## 2015-05-12 LAB — CBC WITH DIFFERENTIAL/PLATELET
BASOS ABS: 0.1 10*3/uL (ref 0.0–0.1)
Basophils Relative: 0.6 % (ref 0.0–3.0)
EOS ABS: 0.7 10*3/uL (ref 0.0–0.7)
Eosinophils Relative: 8.7 % — ABNORMAL HIGH (ref 0.0–5.0)
HCT: 42.2 % (ref 36.0–46.0)
Hemoglobin: 13.7 g/dL (ref 12.0–15.0)
LYMPHS ABS: 1.8 10*3/uL (ref 0.7–4.0)
Lymphocytes Relative: 22.2 % (ref 12.0–46.0)
MCHC: 32.4 g/dL (ref 30.0–36.0)
MCV: 91.2 fl (ref 78.0–100.0)
Monocytes Absolute: 0.4 10*3/uL (ref 0.1–1.0)
Monocytes Relative: 5.4 % (ref 3.0–12.0)
NEUTROS ABS: 5 10*3/uL (ref 1.4–7.7)
NEUTROS PCT: 63.1 % (ref 43.0–77.0)
PLATELETS: 392 10*3/uL (ref 150.0–400.0)
RBC: 4.63 Mil/uL (ref 3.87–5.11)
RDW: 14.3 % (ref 11.5–15.5)
WBC: 7.9 10*3/uL (ref 4.0–10.5)

## 2015-05-12 LAB — TSH: TSH: 2.13 u[IU]/mL (ref 0.35–4.50)

## 2015-05-12 MED ORDER — VENLAFAXINE HCL ER 150 MG PO CP24: 150.0000 mg | ORAL_CAPSULE | Freq: Every day | ORAL | Status: DC

## 2015-05-12 NOTE — Patient Instructions (Addendum)
check with insurance to see if they pay for Zostavax Call if you need Physical Therapy    Cholesterol Cholesterol is a white, waxy, fat-like substance needed by your body in small amounts. The liver makes all the cholesterol you need. Cholesterol is carried from the liver by the blood through the blood vessels. Deposits of cholesterol (plaque) may build up on blood vessel walls. These make the arteries narrower and stiffer. Cholesterol plaques increase the risk for heart attack and stroke.  You cannot feel your cholesterol level even if it is very high. The only way to know it is high is with a blood test. Once you know your cholesterol levels, you should keep a record of the test results. Work with your health care provider to keep your levels in the desired range.  WHAT DO THE RESULTS MEAN?  Total cholesterol is a rough measure of all the cholesterol in your blood.   LDL is the so-called bad cholesterol. This is the type that deposits cholesterol in the walls of the arteries. You want this level to be low.   HDL is the good cholesterol because it cleans the arteries and carries the LDL away. You want this level to be high.  Triglycerides are fat that the body can either burn for energy or store. High levels are closely linked to heart disease.  WHAT ARE THE DESIRED LEVELS OF CHOLESTEROL?  Total cholesterol below 200.   LDL below 100 for people at risk, below 70 for those at very high risk.   HDL above 50 is good, above 60 is best.   Triglycerides below 150.  HOW CAN I LOWER MY CHOLESTEROL?  Diet. Follow your diet programs as directed by your health care provider.   Choose fish or white meat chicken and [place], roasted or baked. Limit fatty cuts of red meat, fried foods, and processed meats, such as sausage and lunch meats.   Eat lots of fresh fruits and vegetables.  Choose whole grains, beans, pasta, potatoes, and cereals.   Use only small amounts of olive, corn, or  canola oils.   Avoid butter, mayonnaise, shortening, or palm kernel oils.  Avoid foods with trans fats.   Drink skim or nonfat milk and eat low-fat or nonfat yogurt and cheeses. Avoid whole milk, cream, ice cream, egg yolks, and full-fat cheeses.   Healthy desserts include angel food cake, ginger snaps, animal crackers, hard candy, popsicles, and low-fat or nonfat frozen yogurt. Avoid pastries, cakes, pies, and cookies.   Exercise. Follow your exercise programs as directed by your health care provider.   A regular program helps decrease LDL and raise HDL.   A regular program helps with weight control.   Do things that increase your activity level like gardening, walking, or taking the stairs. Ask your health care provider about how you can be more active in your daily life.   Medicine. Take medicine only as directed by your health care provider.   Medicine may be prescribed by your health care provider to help lower cholesterol and decrease the risk for heart disease.   If you have several risk factors, you may need medicine even if your levels are normal.   This information is not intended to replace advice given to you by your health care provider. Make sure you discuss any questions you have with your health care provider.   Document Released: 03/01/2001 Document Revised: 06/27/2014 Document Reviewed: 03/20/2013 Elsevier Interactive Patient Education [employer].

## 2015-05-12 NOTE — Progress Notes (Signed)
Pre visit review using our clinic review tool, if applicable. No additional management support is needed unless otherwise documented below in the visit note. 

## 2015-05-17 NOTE — Assessment & Plan Note (Signed)
Recommend calcium intake of 1200 to 1500 mg daily, divided into roughly 3 doses. Best source is the diet and a single dairy serving is about 500 mg, a supplement of calcium citrate once or twice daily to balance diet is fine if not getting enough in diet. Also need Vitamin D 2000 IU caps, 1 cap daily if not already taking vitamin D. Also recommend weight baring exercise on hips and upper body to keep bones strong 

## 2015-05-17 NOTE — Progress Notes (Signed)
Subjective:    Patient ID: Victoria [name], female    DOB: November 29, 1955, 59 y.o.   MRN: [id number]  Chief Complaint  Patient presents with  . Follow-up    HPI Patient is in today for follow-up. Overall she is doing better. Her pneumonia and asthma have settled down and she's not had any recent respiratory difficulties. She has had some trouble with some thoracic back pain secondary to her scoliosis and a fall but that is improving as well. Continues to struggle with heartburn secondary to her hiatal hernia but this is improved with dietary changes. No recent acute illness. Denies CP/palp/SOB/HA/congestion/fevers/GI or GU c/o. Taking meds as prescribed  Past Medical History  Diagnosis Date  . Asthma   . Obesity   . Maxillary sinusitis   . Osteoarthritis   . IBS (irritable bowel syndrome)   . Hyperlipidemia   . COPD (chronic obstructive pulmonary disease) ([place])   . Pulmonary nodules   . Osteoporosis   . GERD (gastroesophageal reflux disease)   . H/O hiatal hernia   . Depression     mild  . Allergic bronchopulmonary aspergillosis ([place]) 2008    sees Dr Edmund Hilda pulmonology  . Anemia     iron deficiency, resolved  . Normal cardiac stress test 11/2011    No evidence of ischemia or infarct. Calculated ejection fraction 72%.  . OSA (obstructive sleep apnea) 02/2012    has stopped using  cpap  . Anxiety   . Pneumonia 11/2011    "before 2013 I hadn't had pneumonia since I was a child" (04/13/2012)  . Schatzki's ring   . History of echocardiogram     Echo 6/16:  Mod LVH, EF 60-65%, no RWMA, Gr 1 DD, trivial MR, normal LA size.  Marland Kitchen CAD (coronary artery disease)     a. LHC 6/16:  oOM1 60, pRCA 25 >> med Rx  . Diverticulosis     Past Surgical History  Procedure Laterality Date  . Cesarean section  1985  . Hernia repair  04/13/2012    VHR laparoscopic  . Appendectomy  1989  . Ventral hernia repair  04/13/2012    Procedure: LAPAROSCOPIC VENTRAL HERNIA;  Surgeon: Adin Hector, MD;  Location: [facility];  Service: General;  Laterality: N/A;  laparoscopic repair of incarcerated hernia  . Cardiac catheterization N/A 11/25/2014    Procedure: Right/Left Heart Cath and Coronary Angiography;  Surgeon: Belva Crome, MD;  Location: [facility];  Service: Cardiovascular;  Laterality: N/A;    Family History  Problem Relation Age of Onset  . Breast cancer Mother   . Diverticulosis Father   . Prostate cancer Father     prostate  . Pulmonary embolism Brother     recurrent  . Breast cancer Sister     breast  . Heart attack Maternal Grandfather   . Stroke Neg Hx   . Hypertension Mother   . Diabetes Mother     Social History   Social History  . Marital Status: Married    Spouse Name: N/A  . Number of Children: 1  . Years of Education: N/A   Occupational History  .     Social History Main Topics  . Smoking status: Never Smoker   . Smokeless tobacco: Never Used  . Alcohol Use: 2.4 oz/week    4 Glasses of wine per week     Comment: 04/13/2012 "beer in the summer; wine 3-4 times/wk"  . Drug Use: No  . Sexual  Activity: Yes     Comment: gluten free, lives with husband and son with CP quadriplegia   Other Topics Concern  . Not on file   Social History Narrative   Cares for a 73yo son with cerebral palsy.     Outpatient Prescriptions Prior to Visit  Medication Sig Dispense Refill  . ALPRAZolam (XANAX) 0.5 MG tablet Take 1 tablet (0.5 mg total) by mouth 2 (two) times daily as needed for anxiety (Palpatations, SOB). 30 tablet 1  . aspirin 81 MG tablet Take 81 mg by mouth daily.     . Cholecalciferol (VITAMIN D) 2000 UNITS CAPS Take 2,000 Units by mouth daily.     . Coenzyme Q10 (CO Q 10 PO) Take 200 mg by mouth 2 (two) times daily.    Marland Kitchen ezetimibe (ZETIA) 10 MG tablet Take 1 tablet (10 mg total) by mouth daily. 30 tablet 6  . fexofenadine (ALLEGRA) 180 MG tablet Take 180 mg by mouth daily as needed for allergies.     . furosemide (LASIX) 20 MG tablet  Take 1 tablet (20 mg total) by mouth daily as needed. 30 tablet 1  . ibuprofen (ADVIL,MOTRIN) 200 MG tablet Take 200 mg by mouth every 6 (six) hours as needed for mild pain or moderate pain.    Marland Kitchen ipratropium-albuterol (DUONEB) 0.5-2.5 (3) MG/3ML SOLN Inhale 3 mLs into the lungs 4 (four) times daily as needed. (Patient taking differently: Inhale 3 mLs into the lungs 4 (four) times daily as needed (wheezing or shortness of breath). ) 360 mL 5  . mometasone (ASMANEX 60 METERED DOSES) 220 MCG/INH inhaler USE 2 INHALATIONS ORALLY EVERY EVENING 3 Inhaler 1  . montelukast (SINGULAIR) 10 MG tablet TAKE 1 TABLET BY MOUTH TWICE DAILY 180 tablet 1  . nitroGLYCERIN (NITROSTAT) 0.4 MG SL tablet Place 1 tablet (0.4 mg total) under the tongue every 5 (five) minutes as needed for chest pain. 25 tablet 0  . Omeprazole-Sodium Bicarbonate (ZEGERID OTC PO) Take 1 tablet by mouth daily.    Marland Kitchen tiotropium (SPIRIVA HANDIHALER) 18 MCG inhalation capsule INHALE THE CONTENTS OF ONE CAPSULE DAILY 90 capsule 1  . venlafaxine XR (EFFEXOR-XR) 150 MG 24 hr capsule Take 1 capsule (150 mg total) by mouth daily with breakfast. 90 capsule 1  . HYDROcodone-homatropine (HYCODAN) 5-1.5 MG/5ML syrup Take 5 mLs by mouth every 6 (six) hours as needed for cough. (Patient not taking: Reported on 05/12/2015) 240 mL 0   No facility-administered medications prior to visit.    Allergies  Allergen Reactions  . Beclomethasone Dipropionate Hives and Other (See Comments)     weight gain  . Budesonide-Formoterol Fumarate Hives  . Mometasone Furo-Formoterol Fum Hives and Other (See Comments)    weight gain  . Sulfonamide Derivatives Hives and Rash  . Statins     Myalgias, RLS    Review of Systems  Constitutional: Negative for fever and malaise/fatigue.  HENT: Negative for congestion.   Eyes: Negative for discharge.  Respiratory: Negative for shortness of breath.   Cardiovascular: Negative for chest pain, palpitations and leg swelling.    Gastrointestinal: Negative for nausea and abdominal pain.  Genitourinary: Negative for dysuria.  Musculoskeletal: Positive for back pain. Negative for falls.  Skin: Negative for rash.  Neurological: Negative for loss of consciousness and headaches.  Endo/Heme/Allergies: Negative for environmental allergies.  Psychiatric/Behavioral: Negative for depression. The patient is not nervous/anxious.        Objective:    Physical Exam  Constitutional: She is oriented to person,  place, and time. She appears well-developed and well-nourished. No distress.  HENT:  Head: Normocephalic and atraumatic.  Nose: Nose normal.  Eyes: Right eye exhibits no discharge. Left eye exhibits no discharge.  Neck: Normal range of motion. Neck supple.  Cardiovascular: Normal rate and regular rhythm.   No murmur heard. Pulmonary/Chest: Effort normal and breath sounds normal.  Abdominal: Soft. Bowel sounds are normal. There is no tenderness.  Musculoskeletal: She exhibits no edema.  Neurological: She is alert and oriented to person, place, and time.  Skin: Skin is warm and dry.  Psychiatric: She has a normal mood and affect.  Nursing note and vitals reviewed.   BP 120/82 mmHg  Pulse 81  Temp(Src) 98.3 F (36.8 C) (Oral)  Ht 5\' 2"  (1.575 m)  Wt 167 lb (75.751 kg)  BMI 30.54 kg/m2  SpO2 98% Wt Readings from Last 3 Encounters:  05/12/15 167 lb (75.751 kg)  05/05/15 167 lb (75.751 kg)  03/12/15 169 lb (76.658 kg)     Lab Results  Component Value Date   WBC 7.9 05/12/2015   HGB 13.7 05/12/2015   HCT 42.2 05/12/2015   PLT 392.0 05/12/2015   GLUCOSE 108* 05/12/2015   CHOL 239* 05/12/2015   TRIG 112.0 05/12/2015   HDL 54.20 05/12/2015   LDLDIRECT 177.8 09/07/2011   LDLCALC 162* 05/12/2015   ALT 23 05/12/2015   AST 20 05/12/2015   NA 143 05/12/2015   K 4.2 05/12/2015   CL 103 05/12/2015   CREATININE 0.68 05/12/2015   BUN 13 05/12/2015   CO2 30 05/12/2015   TSH 2.13 05/12/2015   INR 1.0  11/19/2014   HGBA1C  10/22/2008    5.7 (NOTE) The ADA recommends the following therapeutic goal for glycemic control related to Hgb A1c measurement: Goal of therapy: <6.5 Hgb A1c  Reference: American Diabetes Association: Clinical Practice Recommendations 2010, Diabetes Care, 2010, 33: (Suppl  1).    Lab Results  Component Value Date   TSH 2.13 05/12/2015   Lab Results  Component Value Date   WBC 7.9 05/12/2015   HGB 13.7 05/12/2015   HCT 42.2 05/12/2015   MCV 91.2 05/12/2015   PLT 392.0 05/12/2015   Lab Results  Component Value Date   NA 143 05/12/2015   K 4.2 05/12/2015   CO2 30 05/12/2015   GLUCOSE 108* 05/12/2015   BUN 13 05/12/2015   CREATININE 0.68 05/12/2015   BILITOT 0.3 05/12/2015   ALKPHOS 101 05/12/2015   AST 20 05/12/2015   ALT 23 05/12/2015   PROT 7.5 05/12/2015   ALBUMIN 4.5 05/12/2015   CALCIUM 10.1 05/12/2015   ANIONGAP 7 11/21/2014   GFR 94.45 05/12/2015   Lab Results  Component Value Date   CHOL 239* 05/12/2015   Lab Results  Component Value Date   HDL 54.20 05/12/2015   Lab Results  Component Value Date   LDLCALC 162* 05/12/2015   Lab Results  Component Value Date   TRIG 112.0 05/12/2015   Lab Results  Component Value Date   CHOLHDL 4 05/12/2015   Lab Results  Component Value Date   HGBA1C  10/22/2008    5.7 (NOTE) The ADA recommends the following therapeutic goal for glycemic control related to Hgb A1c measurement: Goal of therapy: <6.5 Hgb A1c  Reference: American Diabetes Association: Clinical Practice Recommendations 2010, Diabetes Care, 2010, 33: (Suppl  1).       Assessment & Plan:   Problem List Items Addressed This Visit    Cardiomegaly  Relevant Orders   TSH (Completed)   Comprehensive metabolic panel (Completed)   Lipid panel (Completed)   CBC w/Diff (Completed)   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[place])    Following with pulmonology doing well at the present time, no change in medications      Esophageal reflux    Avoid  offending foods, start probiotics. Do not eat large meals in late evening and consider raising head of bed.       HLD (hyperlipidemia)    Tolerating statin, encouraged heart healthy diet, avoid trans fats, minimize simple carbs and saturated fats. Increase exercise as tolerated      Obesity    Encouraged DASH diet, decrease po intake and increase exercise as tolerated. Needs 7-8 hours of sleep nightly. Avoid trans fats, eat small, frequent meals every 4-5 hours with lean proteins, complex carbs and healthy fats. Minimize simple carbs, GMO foods.      SCOLIOSIS    Some increased back pain. Encouraged moist heat and gentle stretching as tolerated. May try NSAIDs and prescription meds as directed and report if symptoms worsen or seek immediate care       Other Visit Diagnoses    Hyperlipidemia, mixed    -  Primary    Relevant Orders    TSH (Completed)    Comprehensive metabolic panel (Completed)    Lipid panel (Completed)    CBC w/Diff (Completed)    Eosinophilia        Relevant Orders    TSH (Completed)    Comprehensive metabolic panel (Completed)    Lipid panel (Completed)    CBC w/Diff (Completed)       I am having Ms. Closser maintain her Vitamin D, aspirin, fexofenadine, ipratropium-albuterol, furosemide, HYDROcodone-homatropine, ibuprofen, nitroGLYCERIN, Coenzyme Q10 (CO Q 10 PO), ezetimibe, Omeprazole-Sodium Bicarbonate (ZEGERID OTC PO), ALPRAZolam, tiotropium, mometasone, montelukast, and venlafaxine XR.  Meds ordered this encounter  Medications  . venlafaxine XR (EFFEXOR-XR) 150 MG 24 hr capsule    Sig: Take 1 capsule (150 mg total) by mouth daily with breakfast.    Dispense:  90 capsule    Refill:  1     BLYTH, STACEY, MD

## 2015-05-17 NOTE — Assessment & Plan Note (Signed)
Some increased back pain. Encouraged moist heat and gentle stretching as tolerated. May try NSAIDs and prescription meds as directed and report if symptoms worsen or seek immediate care

## 2015-05-17 NOTE — Assessment & Plan Note (Signed)
Encouraged DASH diet, decrease po intake and increase exercise as tolerated. Needs 7-8 hours of sleep nightly. Avoid trans fats, eat small, frequent meals every 4-5 hours with lean proteins, complex carbs and healthy fats. Minimize simple carbs, GMO foods. 

## 2015-05-17 NOTE — Assessment & Plan Note (Signed)
Tolerating statin, encouraged heart healthy diet, avoid trans fats, minimize simple carbs and saturated fats. Increase exercise as tolerated 

## 2015-05-17 NOTE — Assessment & Plan Note (Signed)
Following with pulmonology doing well at the present time, no change in medications

## 2015-05-17 NOTE — Assessment & Plan Note (Signed)
Avoid offending foods, start probiotics. Do not eat large meals in late evening and consider raising head of bed.  

## 2015-05-18 ENCOUNTER — Encounter: Payer: Self-pay | Admitting: Internal Medicine

## 2015-05-18 NOTE — Telephone Encounter (Signed)
Per pt email: I know we are to call with health issues, BUT, since it's Monday after a holiday weekend figure this may get to you just just as fast or faster.  Anyways, I began 40 mg.of Prednisone Sat. AM. I thought I was fighting something Thursday evening, so On Friday I was sucking on Zicam Rapid Melts and drank [place], so I didn't miserable with cold symptoms. I had head congestion dripping into throat, and then wheezing, SOB and really bad barking dry cough, especially at night. I started breathing treatments on Sat. as well, and 1200 mg. Guaifenesen. I haven't coughed much up but what I have, it is yellow/beige.  I was going to take 9 more days Prednisone by going down to 30 mg. today. I wish I had gotten a written Rx for Tussionex when I was there on 11/15, as I could really use that at night. I just talked to [facility], and last time I had it filled was 2 years ago. The one I had put on file there has expired so they can't fill it.  Thank you, Marya Fossa ---  Please advise Dr. Chase Caller thanks

## 2015-05-18 NOTE — Telephone Encounter (Signed)
Ask patient  1. If the message for request for refill on tussionex. THat was my interprretation. Ok to do tussionex 38mL bid prn x 15 days  2. Does she feel like she needs an antibiotic? She can do cephalexin 500mg  tid x 5 days   Allergies  Allergen Reactions  . Beclomethasone Dipropionate Hives and Other (See Comments)     weight gain  . Budesonide-Formoterol Fumarate Hives  . Mometasone Furo-Formoterol Fum Hives and Other (See Comments)    weight gain  . Sulfonamide Derivatives Hives and Rash  . Statins     Myalgias, RLS

## 2015-05-19 ENCOUNTER — Other Ambulatory Visit: Payer: Self-pay | Admitting: *Deleted

## 2015-05-19 MED ORDER — CEPHALEXIN 500 MG PO CAPS: 500.0000 mg | ORAL_CAPSULE | Freq: Three times a day (TID) | ORAL | Status: DC

## 2015-05-19 MED ORDER — HYDROCOD POLST-CPM POLST ER 10-8 MG/5ML PO SUER: 5.0000 mL | Freq: Two times a day (BID) | ORAL | Status: DC | PRN

## 2015-05-22 ENCOUNTER — Telehealth: Payer: Self-pay | Admitting: Family Medicine

## 2015-05-22 NOTE — Telephone Encounter (Signed)
Can Advanced Directives be done at her appt on 10/22/15 1:30pm? I extended her appt to Medicare [facility] 45 min. Please let me know if I should change it.

## 2015-05-22 NOTE — Telephone Encounter (Signed)
That looks good

## 2015-05-28 ENCOUNTER — Telehealth: Payer: Self-pay | Admitting: *Deleted

## 2015-05-28 NOTE — Telephone Encounter (Signed)
PA for Montelukast initiated thru [facility]; Key: [place] for review.

## 2015-06-01 ENCOUNTER — Encounter: Payer: Self-pay | Admitting: Internal Medicine

## 2015-06-01 ENCOUNTER — Ambulatory Visit (INDEPENDENT_AMBULATORY_CARE_PROVIDER_SITE_OTHER): Payer: Commercial Managed Care - HMO | Admitting: Internal Medicine

## 2015-06-01 VITALS — BP 100/64 | HR 92 | Ht 62.5 in | Wt 167.2 lb

## 2015-06-01 DIAGNOSIS — K449 Diaphragmatic hernia without obstruction or gangrene: Secondary | ICD-10-CM | POA: Diagnosis not present

## 2015-06-01 DIAGNOSIS — K219 Gastro-esophageal reflux disease without esophagitis: Secondary | ICD-10-CM

## 2015-06-01 DIAGNOSIS — R1013 Epigastric pain: Secondary | ICD-10-CM | POA: Diagnosis not present

## 2015-06-01 DIAGNOSIS — R6881 Early satiety: Secondary | ICD-10-CM | POA: Diagnosis not present

## 2015-06-01 MED ORDER — METOCLOPRAMIDE HCL 10 MG PO TABS: 10.0000 mg | ORAL_TABLET | Freq: Three times a day (TID) | ORAL | Status: DC

## 2015-06-01 NOTE — Telephone Encounter (Signed)
PA Case: [id number], Status: Approved, Coverage Starts on: 05/30/2015 12:00:00 AM, Coverage Ends on: 05/29/2016 12:00:00 AM. Mcarthur Rossetti already aware.

## 2015-06-01 NOTE — Patient Instructions (Addendum)
Continue Zegerid over the counter.  We have sent the following medications to your pharmacy for you to pick up at your convenience: Reglan 10 mg- Take 1 tablet before meals and at bedtime.   You have been scheduled for an Upper GI Series at [facility]. Your appointment is on Thursday, 06/25/15 at 11:30 am. Please arrive 15 minutes prior to your test for registration. Make sure not to eat or drink anything after midnight on the night before your test. If you need to reschedule, please call radiology at [phone]. ________________________________________________________________ An upper GI series uses x rays to help diagnose problems of the upper GI tract, which includes the esophagus, stomach, and duodenum. The duodenum is the first part of the small intestine. An upper GI series is conducted by a radiology technologist or a radiologist-a doctor who specializes in x-ray imaging-at a hospital or outpatient center. While sitting or standing in front of an x-ray machine, the patient drinks barium liquid, which is often white and has a chalky consistency and taste. The barium liquid coats the lining of the upper GI tract and makes signs of disease show up more clearly on x rays. X-ray video, called fluoroscopy, is used to view the barium liquid moving through the esophagus, stomach, and duodenum. Additional x rays and fluoroscopy are performed while the patient lies on an x-ray table. To fully coat the upper GI tract with barium liquid, the technologist or radiologist may press on the abdomen or ask the patient to change position. Patients hold still in various positions, allowing the technologist or radiologist to take x rays of the upper GI tract at different angles. If a technologist conducts the upper GI series, a radiologist will later examine the images to look for problems.  This test typically takes about 1 hour to  complete. __________________________________________________________________  Victoria [name] have been scheduled for a 24 Hour PH Probe and esophageal manometry test at [facility] on Monday, 07/13/15 at 12:30 pm. Please arrive 30 minutes prior to your procedure for registration. You will need to go to outpatient registration (1st floor of the hospital) first. Make certain to bring your insurance cards as well as a complete list of medications.  Please remember the following:  1) Nothing to eat after 12:00 midnight on the night before your test. You may have clear liquids until 6:30 am   2) Hold all diabetic medications/insulin the morning of the test. You may eat and take your medications after the test.  It will take at least 2 weeks to receive the results of this test from your physician. ------------------------------------------ ABOUT 24 HOUR Helper PROBE An esophageal pH test measures and records the pH in your esophagus to determine if you have gastroesophageal reflux disease (GERD). The test can also be done to determine the effectiveness of medications or surgical treatment for GERD. What is esophageal reflux? Esophageal reflux is a condition in which stomach acid refluxes or moves back into the esophagus (the "food pipe" leading from the mouth to the stomach). How does the esophageal pH test work? A thin, small tube with an acid sensing device on the tip is gently passed through your nose, down the esophagus ("food tube"), and positioned about 2 inches above the lower esophageal sphincter. The tube is secured to the side of your face with clear tape. The end of the tube exiting from your nose is attached to a portable recorder that is worn on your belt or over your shoulder. The recorder has  several buttons on it that you will press to mark certain events. A nurse will review the monitoring instructions with you. Once the test has begun, what do I need to know and do? Activity: Follow your  usual daily routine. Do not reduce or change your activities during the monitoring period. Doing so can make the monitoring results less useful.  Note: do not take a tub bath or shower; the equipment can't get wet.  Eating: Eat your regular meals at the usual times. If you do not eat during the monitoring period, your stomach will not produce acid as usual, and the test results will not be accurate. Eat at least 2 meals a day. Eat foods that tend to increase your symptoms (without making yourself miserable). Avoid snacking. Do not suck on hard candy or lozenges and do not chew gum during the monitoring period.  Lying down: Remain upright throughout the day. Do not lie down until you go to bed (unless napping or lying down during the day is part of your daily routine).  Medications: Continue to follow your doctor's advice regarding medications to avoid during the monitoring period.  Recording symptoms: Press the appropriate button on your recorder when symptoms occur (as discussed with the nurse).  Recording events: Record the time you start and stop eating and drinking (anything other than plain water). Record the time you lie down (even if just resting) and when you get back up. The nurse will explain this.  Unusual symptoms or side effects. If you think you may be experiencing any unusual symptoms or side effects, call your doctor.  You will return the next day to have the tube removed. The information on the recorder will be downloaded to a computer and the results will be analyzed.  After completion of the study Resume your normal diet and medications. Lozenges or hard candy may help ease any sore throat caused by the tube.   ABOUT ESOPHAGEAL MANOMETRY Esophageal manometry (muh-NOM-uh-tree) is a test that gauges how well your esophagus works. Your esophagus is the long, muscular tube that connects your throat to your stomach. Esophageal manometry measures the rhythmic muscle contractions  (peristalsis) that occur in your esophagus when you swallow. Esophageal manometry also measures the coordination and force exerted by the muscles of your esophagus.  During esophageal manometry, a thin, flexible tube (catheter) that contains sensors is passed through your nose, down your esophagus and into your stomach. Esophageal manometry can be helpful in diagnosing some mostly uncommon disorders that affect your esophagus.  Why it's done Esophageal manometry is used to evaluate the movement (motility) of food through the esophagus and into the stomach. The test measures how well the circular bands of muscle (sphincters) at the top and bottom of your esophagus open and close, as well as the pressure, strength and pattern of the wave of esophageal muscle contractions that moves food along.  What you can expect Esophageal manometry is an outpatient procedure done without sedation. Most people tolerate it well. You may be asked to change into a hospital gown before the test starts.  During esophageal manometry  While you are sitting up, a member of your health care team sprays your throat with a numbing medication or puts numbing gel in your nose or both.  A catheter is guided through your nose into your esophagus. The catheter may be sheathed in a water-filled sleeve. It doesn't interfere with your breathing. However, your eyes may water, and you may gag. You may have  a slight nosebleed from irritation.  After the catheter is in place, you may be asked to lie on your back on an exam table, or you may be asked to remain seated.  You then swallow small sips of water. As you do, a computer connected to the catheter records the pressure, strength and pattern of your esophageal muscle contractions.  During the test, you'll be asked to breathe slowly and smoothly, remain as still as possible, and swallow only when you're asked to do so.  A member of your health care team may move the catheter down into your  stomach while the catheter continues its measurements.  The catheter then is slowly withdrawn. The test usually lasts 20 to 30 minutes.  After esophageal manometry  When your esophageal manometry is complete, you may return to your normal activities  This test typically takes 30-45 minutes to complete. _______________________________________________________________________________________________________________________________  Victoria [name] have been scheduled for an appointment with Dr Zella Richer at [facility]. Your appointment is on 07/21/15 at 9:30 am. Please arrive at 9:00 am for registration. Make certain to bring a list of current medications, including any over the counter medications or vitamins. Also bring your co-pay if you have one as well as your insurance cards. [facility] is located at [address]. Should you need to reschedule your appointment, please contact them at [phone].

## 2015-06-01 NOTE — Progress Notes (Signed)
Subjective:    Patient ID: Victoria [name], female    DOB: 1955/07/10, 59 y.o.   MRN: [id number]  HPI Victoria [name] is a 59 year old female with past medical history of GERD, large hiatal hernia, IBS, pulmonary aspergillosis and bronchiectasis is here for follow-up. She was last seen in the office one year ago. She did have an upper endoscopy last in October 2015 revealing a Schatzki's ring 32 cm from the incisors. This was dilated to 18 mm. This was at a large hiatal hernia measuring 6 cm. The stomach and small bowel looked normal.  At last visit she was taken pantoprazole but she felt that this was causing nausea so she switched back to Zegerid. She is using Zegerid once daily. Heartburn is well-controlled with this. If she misses a dose the heartburn returns. She denies abdominal pain. She is complaining of significant upper abdominal pressure, early satiety with eating. At times she reports she will eat have subxiphoid and epigastric pressure and fullness only relieved by vomiting. At its worst it occurred 2 times in the same day. September seemed to be a tickly bedtime for her. She's tried to adapt her diet by eating smaller more frequent meals. Dry foods or large bites are more difficult for her to tolerate. Bowel movements have been regular recently without blood in her stool or melena.   Review of Systems As per history of present illness, otherwise negative  Current Medications, Allergies, Past Medical History, Past Surgical History, Family History and Social History were reviewed in [employer] record.     Objective:   Physical Exam BP 100/64 mmHg  Pulse 92  Ht 5' 2.5" (1.588 m)  Wt 167 lb 4 oz (75.864 kg)  BMI 30.08 kg/m2 Constitutional: Well-developed and well-nourished. No distress. HEENT: Normocephalic and atraumatic. Oropharynx is clear and moist. No oropharyngeal exudate. Conjunctivae are normal.  No scleral icterus. Neck: Neck supple. Trachea  midline. Cardiovascular: Normal rate, regular rhythm and intact distal pulses. No M/R/G Pulmonary/chest: Effort normal and breath sounds normal. No wheezing, rales or rhonchi. Abdominal: Soft, nontender, nondistended. Bowel sounds active throughout.  Extremities: no clubbing, cyanosis, or edema Neurological: Alert and oriented to person place and time. Psychiatric: Normal mood and affect. Behavior is normal.  CT ANGIOGRAPHY CHEST WITH CONTRAST   TECHNIQUE: Multidetector CT imaging of the chest was performed using the standard protocol during bolus administration of intravenous contrast. Multiplanar CT image reconstructions and MIPs were obtained to evaluate the vascular anatomy.   CONTRAST:  67mL OMNIPAQUE IOHEXOL 350 MG/ML SOLN   COMPARISON:  11/09/2014; 11/08/2014; chest CT - 05/22/2014   FINDINGS: Vascular Findings:   There is adequate opacification of the pulmonary arterial system with the main pulmonary artery measuring 278 Hounsfield units. There no discrete filling defects within the pulmonary arterial tree to suggest pulmonary embolism. Normal caliber the main pulmonary artery.   Borderline cardiomegaly.  No pericardial effusion.   Normal caliber of the thoracic aorta. No definite thoracic aortic dissection on this nongated examination. Bovine configuration of the aortic arch. The branch vessels of the aortic arch appear widely patent throughout their imaged course.   Review of the MIP images confirms the above findings.     ----------------------------------------------------------------------------------   Nonvascular Findings:   Evaluation the pulmonary parenchyma is degraded secondary to patient respiratory artifact. Ill-defined heterogeneous and consolidative airspace opacities within in the anterior and medial basilar segments of the right lower lobe (representative image 66, series 400, compatible with the findings on recently obtained  chest radiograph.  There is minimal subsegmental atelectasis within the left costophrenic angle. There is grossly unchanged biapical pleural parenchymal thickening.   Note is again made of a mesenteric fat containing Bochdalek as well as a moderate-sized hiatal hernia.   The approximately 6 mm nodule within the right costophrenic angle (68, series 407) is grossly unchanged since the 11/2011 examination. Evaluation for additional discrete pulmonary nodules is degraded secondary to presumed infectious process within the right lower lobe. Scattered shotty mediastinal lymph nodes are individually not enlarged by size criteria with index precarinal lymph node measuring 0.8 cm in greatest short axis diameter (image 35, series 401). No definitive mediastinal, hilar or axillary lymphadenopathy.   Early arterial phase evaluation of the upper abdomen demonstrates an ill-defined hypo attenuating lesion within the right lobe of the liver (image 110, series 401) which is incompletely imaged, likely too small to adequately characterize may represent hepatic cysts.   No acute or aggressive osseous abnormalities. Regional soft tissues appear normal. Normal appearance of the thyroid gland.   IMPRESSION: 1. No evidence of pulmonary embolism. 2. Findings again worrisome for right lower lobe pneumonia. A follow-up chest radiograph in 3-4 weeks after treatment is recommended to ensure resolution. 3. Punctate approximately 6 mm nodule within the right lower lobe is grossly unchanged since the 11/2011 examination and thus of benign etiology - evaluation for additional discrete pulmonary nodules is degraded secondary to concomitant superimposed acute infectious process within the right lower lobe. 4. Unchanged biapical pleural parenchymal thickening. 5. Unchanged moderate-sized Bochdalek and hiatal hernias.     Electronically Signed   By: Sandi Mariscal M.D.   On: 11/09/2014 23:32      Assessment & Plan:  59 year old  female with past medical history of GERD, large hiatal hernia, IBS, pulmonary aspergillosis and bronchiectasis is here for follow-up  1. Large hiatal hernia/upper abd pressure/vomiting -- I feel that her upper GI symptoms are related to her large hiatal hernia and likely poor clearance from the hernia sac with large meals and certain foods. She has made dietary modifications and despite this is still having symptoms. She has researched hiatal hernia repair and had multiple questions to this regard today. I feel that she would be a good candidate for Nissen fundoplication with hiatal hernia repair. She will need to meet with surgery to discuss this potential operation, risks, benefits, alternatives and expected recovery. She will need esophageal manometry and upper GI series which will be scheduled. She has had upper endoscopy recently and these results are reviewed in the history of present illness. For now she will continue Zegerid once daily and reflux precautions. She is sleeping at night propped up.  I will try Reglan 10 mg before meals and at bedtime. I will have her try this particularly before large meals to see if this helps with gastric clearance. We discussed the risks of this medication including that of tardive dyskinesia. Should she experience any neuromuscular or other side effects she is asked to discontinue the medicine and notify me immediately. She voices understanding. She is being referred to Dr. Zella Richer.  2. GERD -- as in #1. Continue Zegerid and reflux precautions  3. CRC screening -- colon 2012, no polyps, repeat in 2022

## 2015-06-05 ENCOUNTER — Ambulatory Visit: Payer: Commercial Managed Care - HMO | Admitting: Internal Medicine

## 2015-06-18 ENCOUNTER — Encounter: Payer: Self-pay | Admitting: Internal Medicine

## 2015-06-23 ENCOUNTER — Ambulatory Visit (HOSPITAL_COMMUNITY): Payer: Commercial Managed Care - HMO

## 2015-06-25 ENCOUNTER — Ambulatory Visit (HOSPITAL_COMMUNITY): Payer: Commercial Managed Care - HMO

## 2015-06-26 ENCOUNTER — Ambulatory Visit (INDEPENDENT_AMBULATORY_CARE_PROVIDER_SITE_OTHER): Payer: PPO | Admitting: Cardiology

## 2015-06-26 ENCOUNTER — Encounter: Payer: Self-pay | Admitting: Cardiology

## 2015-06-26 VITALS — BP 124/80 | HR 88 | Ht 62.5 in | Wt 167.0 lb

## 2015-06-26 DIAGNOSIS — I2583 Coronary atherosclerosis due to lipid rich plaque: Secondary | ICD-10-CM

## 2015-06-26 DIAGNOSIS — I5032 Chronic diastolic (congestive) heart failure: Secondary | ICD-10-CM | POA: Diagnosis not present

## 2015-06-26 DIAGNOSIS — I2510 Atherosclerotic heart disease of native coronary artery without angina pectoris: Secondary | ICD-10-CM

## 2015-06-26 DIAGNOSIS — I209 Angina pectoris, unspecified: Secondary | ICD-10-CM

## 2015-06-26 DIAGNOSIS — E785 Hyperlipidemia, unspecified: Secondary | ICD-10-CM | POA: Diagnosis not present

## 2015-06-26 NOTE — Patient Instructions (Signed)
Medication Instructions:   Your physician recommends that you continue on your current medications as directed. Please refer to the Current Medication list given to you today.     You have been referred to [facility] FOR YOUR HYPERLIPIDEMIA    Follow-Up:  Your physician wants you to follow-up in: [place] will receive a reminder letter in the mail two months in advance. If you don't receive a letter, please call our office to schedule the follow-up appointment.      If you need a refill on your cardiac medications before your next appointment, please call your pharmacy.

## 2015-06-26 NOTE — Progress Notes (Signed)
Patient ID: Victoria [name], female   DOB: 1955/10/02, 60 y.o.   MRN: [id number]    Cardiology Office Note   Date:  06/26/2015   ID:  Victoria [name], DOB [birth year], MRN [id number]  PCP:  [name], MD  Cardiologist:  Dr. Ena Dawley    Chief complain: chest pain   History of Present Illness: Victoria [name] is a 60 y.o. female with a hx of severe persistent asthma, HL, obesity.  She had a normal stress test in 2013.  She was evaluated by Dr. Ena Dawley 3/15 for chest pain.  Cardiac CTA was recommended.  Her insurance denied paying for this test.  Stress echo was arranged but the patient declined to schedule it.    Admitted 10/2014 with bilateral lower lobe community acquired pneumonia.  She was readmitted within 24 hours for R lateral and inferior chest pain.  CTA was neg for PE.  Troponins were negative.  CXR showed worsening bilateral PNA.  She returned to the ED with chest pain on 5/30.  CEs were neg.    I saw her 11/19/14 with complaints of severe fatigue and worse DOE for the last 6 mos.  She had seen her pulmonologist several times and also had been to the emergency room several times. She complained of decreased exercise tolerance with elevated heart rate and blood pressure, palpitations with any type of activity. She c/o chest pain frequently. She noted relief with NTG in the past.  We set her up for LHC and an Echo.  Cardiac cath demonstrated mod non-obstructive CAD with ostial OM1 60%. This was not felt to be severe enough to proceed with PCI.  Vasospasm could not be excluded.  PRN Nitro was recommended.    06/26/2015 - he returns for 6 months follow up, occasional chest pain like yesterday with asthma exacerbation. She denies orthopnea, PND. No claudications. She does note dependent LE edema, ok today. She denies syncope.    Studies/Reports Reviewed Today:  Echo 11/28/14 - Moderateconcentric hypertrophy. EF 60% to 65%. Wallmotion was normal; Grade 1 diastolic dysfunction - Mitral  valve: There was trivial regurgitation. - Left atrium: The atrium was normal in size. Impressions:- LVEF 60-65%, modearate LVH, diastolic dysfunction, indeterminateLV filling pressure, normal LA size.  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 There is ostial/proximal 25-30% narrowing but not the classic slitlike appearance.  60-70% stenosis in the ostium of the first obtuse marginal.  Otherwise normal coronary arteries.  Normal left ventricular function.  Normal pulmonary artery pressures RECOMMENDATIONS:  Continue to use sublingual nitroglycerin as needed for relief of discomfort.  Cannot exclude the possibility of coronary artery spasm.  There was not an indication for PCI on the obtuse marginal as it did not appear to be a hemodynamically significant stenosis.  Alternative explanations for the patient's chest discomfort including GI, musculoskeletal, or other etiology should be considered.  Risk factor modification including 81 mg of aspirin daily  Chest CTA 11/09/14 IMPRESSION: 1. No evidence of pulmonary embolism. 2. Findings again worrisome for right lower lobe pneumonia. A follow-up chest radiograph in 3-4 weeks after treatment is recommended to ensure resolution. 3. Punctate approximately 6 mm nodule within the right lower lobe is grossly unchanged since the 11/2011 examination and thus of benign etiology - evaluation for additional discrete pulmonary nodules is degraded secondary to concomitant superimposed acute infectious process within the right lower lobe. 4. Unchanged biapical pleural  parenchymal thickening. 5. Unchanged moderate-sized Bochdalek and hiatal hernias.  Myoview 12/14/11 IMPRESSION: No evidence of ischemia or infarct. Calculated ejection fraction 72%.  Past Medical History  Diagnosis Date  . Asthma   . Obesity   . Maxillary sinusitis   .  Osteoarthritis   . IBS (irritable bowel syndrome)   . Hyperlipidemia   . COPD (chronic obstructive pulmonary disease) ([place])   . Pulmonary nodules   . Osteoporosis   . GERD (gastroesophageal reflux disease)   . H/O hiatal hernia   . Depression     mild  . Allergic bronchopulmonary aspergillosis ([place]) 2008    sees Dr Edmund Hilda pulmonology  . Anemia     iron deficiency, resolved  . Normal cardiac stress test 11/2011    No evidence of ischemia or infarct. Calculated ejection fraction 72%.  . OSA (obstructive sleep apnea) 02/2012    has stopped using  cpap  . Anxiety   . Pneumonia 11/2011    "before 2013 I hadn't had pneumonia since I was a child" (04/13/2012)  . Schatzki's ring   . History of echocardiogram     Echo 6/16:  Mod LVH, EF 60-65%, no RWMA, Gr 1 DD, trivial MR, normal LA size.  Marland Kitchen CAD (coronary artery disease)     a. LHC 6/16:  oOM1 60, pRCA 25 >> med Rx  . Diverticulosis     Past Surgical History  Procedure Laterality Date  . Cesarean section  1985  . Hernia repair  04/13/2012    VHR laparoscopic  . Appendectomy  1989  . Ventral hernia repair  04/13/2012    Procedure: LAPAROSCOPIC VENTRAL HERNIA;  Surgeon: Adin Hector, MD;  Location: [facility];  Service: General;  Laterality: N/A;  laparoscopic repair of incarcerated hernia  . Cardiac catheterization N/A 11/25/2014    Procedure: Right/Left Heart Cath and Coronary Angiography;  Surgeon: Belva Crome, MD;  Location: [facility];  Service: Cardiovascular;  Laterality: N/A;     Current Outpatient Prescriptions  Medication Sig Dispense Refill  . ALPRAZolam (XANAX) 0.5 MG tablet Take 1 tablet (0.5 mg total) by mouth 2 (two) times daily as needed for anxiety (Palpatations, SOB). 30 tablet 1  . aspirin 81 MG tablet Take 81 mg by mouth daily.     . chlorpheniramine-HYDROcodone (TUSSIONEX PENNKINETIC ER) 10-8 MG/5ML SUER Take 5 mLs by mouth 2 (two) times daily as needed for cough. 140 mL 0  .  Cholecalciferol (VITAMIN D) 2000 UNITS CAPS Take 2,000 Units by mouth daily.     . Coenzyme Q10 (CO Q 10 PO) Take 200 mg by mouth 2 (two) times daily.    . fexofenadine (ALLEGRA) 180 MG tablet Take 180 mg by mouth daily as needed for allergies.     Marland Kitchen ibuprofen (ADVIL,MOTRIN) 200 MG tablet Take 200 mg by mouth every 6 (six) hours as needed for mild pain or moderate pain.    Marland Kitchen ipratropium-albuterol (DUONEB) 0.5-2.5 (3) MG/3ML SOLN Inhale 3 mLs into the lungs 4 (four) times daily as needed. (Patient taking differently: Inhale 3 mLs into the lungs 4 (four) times daily as needed (wheezing or shortness of breath). ) 360 mL 5  . metoCLOPramide (REGLAN) 10 MG tablet Take 1 tablet (10 mg total) by mouth 4 (four) times daily -  before meals and at bedtime. 90 tablet 2  . mometasone (ASMANEX 60 METERED DOSES) 220 MCG/INH inhaler USE 2 INHALATIONS ORALLY EVERY EVENING 3 Inhaler 1  . montelukast (SINGULAIR) 10 MG  tablet TAKE 1 TABLET BY MOUTH TWICE DAILY 180 tablet 1  . nitroGLYCERIN (NITROSTAT) 0.4 MG SL tablet Place 1 tablet (0.4 mg total) under the tongue every 5 (five) minutes as needed for chest pain. 25 tablet 0  . Omeprazole-Sodium Bicarbonate (ZEGERID OTC PO) Take 1 tablet by mouth daily.    Marland Kitchen tiotropium (SPIRIVA HANDIHALER) 18 MCG inhalation capsule INHALE THE CONTENTS OF ONE CAPSULE DAILY 90 capsule 1  . venlafaxine XR (EFFEXOR-XR) 150 MG 24 hr capsule Take 1 capsule (150 mg total) by mouth daily with breakfast. 90 capsule 1  . furosemide (LASIX) 20 MG tablet Take 1 tablet (20 mg total) by mouth daily as needed. (Patient not taking: Reported on 06/26/2015) 30 tablet 1   No current facility-administered medications for this visit.    Allergies:   Beclomethasone dipropionate; Budesonide-formoterol fumarate; Mometasone furo-formoterol fum; Sulfonamide derivatives; and Statins    Social History:  The patient  reports that she has never smoked. She has never used smokeless tobacco. She reports that she  drinks about 2.4 oz of alcohol per week. She reports that she does not use illicit drugs.   Family History:  The patient's family history includes Breast cancer in her mother and sister; Diabetes in her mother; Diverticulosis in her father; Heart attack in her maternal grandfather; Hypertension in her mother; Prostate cancer in her father; Pulmonary embolism in her brother. There is no history of Stroke.    ROS:   Please see the history of present illness.   Review of Systems  Cardiovascular: Positive for dyspnea on exertion.  All other systems reviewed and are negative.    PHYSICAL EXAM: VS:  BP 124/80 mmHg  Pulse 88  Ht 5' 2.5" (1.588 m)  Wt 167 lb (75.751 kg)  BMI 30.04 kg/m2    Wt Readings from Last 3 Encounters:  06/26/15 167 lb (75.751 kg)  06/01/15 167 lb 4 oz (75.864 kg)  05/12/15 167 lb (75.751 kg)     GEN: Well nourished, well developed, in no acute distress HEENT: normal Neck: no JVD,   no masses Cardiac:  Normal S1/S2, RRR, no edema   Respiratory:  clear to auscultation bilaterally, no wheezing, rhonchi or rales. GI: soft, nontender, nondistended, + BS MS: no deformity or atrophy Skin: warm and dry  Neuro:  CNs II-XII intact, Strength and sensation are intact Psych: Normal affect   EKG:  EKG is ordered today.  It demonstrates:   NSR, HR 73, no change from prior tracing   Recent Labs: 11/17/2014: B Natriuretic Peptide 20.8 02/25/2015: Pro B Natriuretic peptide (BNP) 16.0 05/12/2015: ALT 23; BUN 13; Creatinine, Ser 0.68; Hemoglobin 13.7; Platelets 392.0; Potassium 4.2; Sodium 143; TSH 2.13    Lipid Panel    Component Value Date/Time   CHOL 239* 05/12/2015 1356   TRIG 112.0 05/12/2015 1356   HDL 54.20 05/12/2015 1356   CHOLHDL 4 05/12/2015 1356   VLDL 22.4 05/12/2015 1356   LDLCALC 162* 05/12/2015 1356   LDLDIRECT 177.8 09/07/2011 0945      ASSESSMENT AND PLAN:  1. CAD:  Occasional chest pains,moderate non-obstructive disease in multiple vessels,  intolerant to 4 statins, LDL 162, god diet, we will refer to lipid clinic for PCSK 9 inhibitors enrollment.  2. HLD (hyperlipidemia): She is intolerant to statins. No improvement on zetia. Referral to lipid clinic as above.  3. Asthma, severe persistent, uncomplicated: Follow-up with pulmonology as planned.  4. Chronic diastolic CHF - now euvolemic, lasix PRN  Disposition:   FU  with Dr. Ena Dawley 6 mos.    Dorothy Spark, MD 06/26/2015 11:26 AM    [facility] [place], [place],   [address] Phone: [phone]; Fax: [phone]

## 2015-06-29 ENCOUNTER — Encounter: Payer: Self-pay | Admitting: Internal Medicine

## 2015-06-29 NOTE — Telephone Encounter (Signed)
Yes, if she is canceling plans to discuss [facility] repair, then all associated pre-operative testing can be canceled for now as well Thanks

## 2015-07-06 ENCOUNTER — Ambulatory Visit (HOSPITAL_COMMUNITY): Payer: Self-pay

## 2015-07-08 ENCOUNTER — Ambulatory Visit (HOSPITAL_COMMUNITY): Payer: Self-pay

## 2015-07-13 ENCOUNTER — Encounter (HOSPITAL_COMMUNITY): Payer: Self-pay

## 2015-07-13 ENCOUNTER — Ambulatory Visit (HOSPITAL_COMMUNITY): Admit: 2015-07-13 | Payer: Commercial Managed Care - HMO | Admitting: Internal Medicine

## 2015-07-13 SURGERY — MANOMETRY, ESOPHAGUS

## 2015-07-15 ENCOUNTER — Ambulatory Visit: Payer: PPO | Admitting: Pharmacist

## 2015-07-17 ENCOUNTER — Encounter: Payer: Self-pay | Admitting: Adult Health

## 2015-07-17 ENCOUNTER — Ambulatory Visit (INDEPENDENT_AMBULATORY_CARE_PROVIDER_SITE_OTHER): Payer: PPO | Admitting: Adult Health

## 2015-07-17 ENCOUNTER — Ambulatory Visit (INDEPENDENT_AMBULATORY_CARE_PROVIDER_SITE_OTHER)
Admission: RE | Admit: 2015-07-17 | Discharge: 2015-07-17 | Disposition: A | Discharge status: Home / Self Care | Payer: PPO | Source: Ambulatory Visit | Attending: Adult Health | Admitting: Adult Health

## 2015-07-17 VITALS — BP 118/78 | HR 71 | Temp 98.0°F | Ht 62.0 in | Wt 166.0 lb

## 2015-07-17 DIAGNOSIS — J4550 Severe persistent asthma, uncomplicated: Secondary | ICD-10-CM | POA: Diagnosis not present

## 2015-07-17 DIAGNOSIS — R05 Cough: Secondary | ICD-10-CM

## 2015-07-17 DIAGNOSIS — B4481 Allergic bronchopulmonary aspergillosis: Secondary | ICD-10-CM

## 2015-07-17 DIAGNOSIS — R059 Cough, unspecified: Secondary | ICD-10-CM

## 2015-07-17 LAB — NITRIC OXIDE: Nitric Oxide: 80

## 2015-07-17 MED ORDER — BUDESONIDE 180 MCG/ACT IN AEPB: 2.0000 | INHALATION_SPRAY | Freq: Two times a day (BID) | RESPIRATORY_TRACT | Status: DC

## 2015-07-17 NOTE — Progress Notes (Signed)
Subjective:    Patient ID: Victoria [name], female    DOB: 11-Nov-1955, 60 y.o.   MRN: [id number]  HPI 60 yo female with severe persistent asthma w/ ABPA plus eosinophilia    TEST   FeNO 05/05/15 >80  FeNO 07/17/2015 >80   07/17/2015 Follow up : Asthma/ABPA/Eosinophilia  Pt returns for 2 month follow up  Complains of  1 week chest tightness, wheezing, SOB with activity, and dry cough.  Started on prednisone taper. Has few days left.  Is starting to feel some better but not totally over it. Still has some dry cough.  Denies any sinus  drainage/congestion, fever, discolored mucus, nausea or vomiting.   On Spiriva and Asmanex.  Says Asmanex is not longer covered by insurance needs alternative.   Today FeNO was 80, same as last ov.    Past Medical History  Diagnosis Date  . Asthma   . Obesity   . Maxillary sinusitis   . Osteoarthritis   . IBS (irritable bowel syndrome)   . Hyperlipidemia   . COPD (chronic obstructive pulmonary disease) ([place])   . Pulmonary nodules   . Osteoporosis   . GERD (gastroesophageal reflux disease)   . H/O hiatal hernia   . Depression     mild  . Allergic bronchopulmonary aspergillosis ([place]) 2008    sees Dr Edmund Hilda pulmonology  . Anemia     iron deficiency, resolved  . Normal cardiac stress test 11/2011    No evidence of ischemia or infarct. Calculated ejection fraction 72%.  . OSA (obstructive sleep apnea) 02/2012    has stopped using  cpap  . Anxiety   . Pneumonia 11/2011    "before 2013 I hadn't had pneumonia since I was a child" (04/13/2012)  . Schatzki's ring   . History of echocardiogram     Echo 6/16:  Mod LVH, EF 60-65%, no RWMA, Gr 1 DD, trivial MR, normal LA size.  Marland Kitchen CAD (coronary artery disease)     a. LHC 6/16:  oOM1 60, pRCA 25 >> med Rx  . Diverticulosis    Current Outpatient Prescriptions on File Prior to Visit  Medication Sig Dispense Refill  . ALPRAZolam (XANAX) 0.5 MG tablet Take 1 tablet (0.5 mg total) by mouth  2 (two) times daily as needed for anxiety (Palpatations, SOB). 30 tablet 1  . aspirin 81 MG tablet Take 81 mg by mouth daily.     . chlorpheniramine-HYDROcodone (TUSSIONEX PENNKINETIC ER) 10-8 MG/5ML SUER Take 5 mLs by mouth 2 (two) times daily as needed for cough. 140 mL 0  . Cholecalciferol (VITAMIN D) 2000 UNITS CAPS Take 2,000 Units by mouth daily.     . Coenzyme Q10 (CO Q 10 PO) Take 200 mg by mouth 2 (two) times daily.    . fexofenadine (ALLEGRA) 180 MG tablet Take 180 mg by mouth daily as needed for allergies.     . furosemide (LASIX) 20 MG tablet Take 1 tablet (20 mg total) by mouth daily as needed. 30 tablet 1  . ibuprofen (ADVIL,MOTRIN) 200 MG tablet Take 200 mg by mouth every 6 (six) hours as needed for mild pain or moderate pain.    Marland Kitchen ipratropium-albuterol (DUONEB) 0.5-2.5 (3) MG/3ML SOLN Inhale 3 mLs into the lungs 4 (four) times daily as needed. (Patient taking differently: Inhale 3 mLs into the lungs 4 (four) times daily as needed (wheezing or shortness of breath). ) 360 mL 5  . metoCLOPramide (REGLAN) 10 MG tablet Take 1 tablet (10 mg  total) by mouth 4 (four) times daily -  before meals and at bedtime. 90 tablet 2  . mometasone (ASMANEX 60 METERED DOSES) 220 MCG/INH inhaler USE 2 INHALATIONS ORALLY EVERY EVENING 3 Inhaler 1  . montelukast (SINGULAIR) 10 MG tablet TAKE 1 TABLET BY MOUTH TWICE DAILY 180 tablet 1  . nitroGLYCERIN (NITROSTAT) 0.4 MG SL tablet Place 1 tablet (0.4 mg total) under the tongue every 5 (five) minutes as needed for chest pain. 25 tablet 0  . Omeprazole-Sodium Bicarbonate (ZEGERID OTC PO) Take 1 tablet by mouth daily.    Marland Kitchen tiotropium (SPIRIVA HANDIHALER) 18 MCG inhalation capsule INHALE THE CONTENTS OF ONE CAPSULE DAILY 90 capsule 1  . venlafaxine XR (EFFEXOR-XR) 150 MG 24 hr capsule Take 1 capsule (150 mg total) by mouth daily with breakfast. 90 capsule 1   No current facility-administered medications on file prior to visit.      Review of  Systems Constitutional:   No  weight loss, night sweats,  Fevers, chills, fatigue, or  lassitude.  HEENT:   No headaches,  Difficulty swallowing,  Tooth/dental problems, or  Sore throat,                No sneezing, itching, ear ache, + nasal congestion, post nasal drip,   CV:  No chest pain,  Orthopnea, PND, swelling in lower extremities, anasarca, dizziness, palpitations, syncope.   GI  No heartburn, indigestion, abdominal pain, nausea, vomiting, diarrhea, change in bowel habits, loss of appetite, bloody stools.   Resp:    No chest wall deformity  Skin: no rash or lesions.  GU: no dysuria, change in color of urine, no urgency or frequency.  No flank pain, no hematuria   MS:  No joint pain or swelling.  No decreased range of motion.  No back pain.  Psych:  No change in mood or affect. No depression or anxiety.  No memory loss.         Objective:   Physical Exam  Filed Vitals:   07/17/15 1145  BP: 118/78  Pulse: 71  SpO2: 100%   GEN: A/Ox3; pleasant , NAD, well nourished   HEENT:  [place]/AT,  EACs-clear, TMs-wnl, NOSE-clear drainage  THROAT-clear, no lesions, no postnasal drip or exudate noted.   NECK:  Supple w/ fair ROM; no JVD; normal carotid impulses w/o bruits; no thyromegaly or nodules palpated; no lymphadenopathy.  RESP  Decreased BS in bases .no accessory muscle use, no dullness to percussion  CARD:  RRR, no m/r/g  , no peripheral edema, pulses intact, no cyanosis or clubbing.  GI:   Soft & nt; nml bowel sounds; no organomegaly or masses detected.  Musco: Warm bil, no deformities or joint swelling noted.   Neuro: alert, no focal deficits noted.    Skin: Warm, no lesions or rashes        Assessment & Plan:

## 2015-07-17 NOTE — Assessment & Plan Note (Signed)
Flare now resolving on steroids  FeNO remains elevated Check cxr today  Plan  Finish Prednisone .  Change to Asmanex to Pulmicort 2 puffs Twice daily  .  Call back if not affordable or not covered.  Chest xray today .  Follow up Dr. Chase Caller in  3 -4 months and As needed   Please contact office for sooner follow up if symptoms do not improve or worsen or seek emergency care

## 2015-07-17 NOTE — Assessment & Plan Note (Signed)
Finish Prednisone .  Change to Asmanex to Pulmicort 2 puffs Twice daily  .  Call back if not affordable or not covered.  Chest xray today .  Follow up Dr. Chase Caller in  3 -4 months and As needed   Please contact office for sooner follow up if symptoms do not improve or worsen or seek emergency care

## 2015-07-17 NOTE — Patient Instructions (Addendum)
Finish Prednisone .  Change to Asmanex to Pulmicort 2 puffs Twice daily  .  Call back if not affordable or not covered.  Chest xray today .  Follow up Dr. Chase Caller in  3 -4 months and As needed   Please contact office for sooner follow up if symptoms do not improve or worsen or seek emergency care

## 2015-07-21 ENCOUNTER — Telehealth: Payer: Self-pay | Admitting: Adult Health

## 2015-07-21 NOTE — Telephone Encounter (Signed)
Result Note     cxr w/ no sign of PNA     Cont w/ ov recs    Please contact office for sooner follow up if symptoms do not improve or worsen or seek emergency care    --  I spoke with patient about results and she verbalized understanding and had no questions.

## 2015-07-21 NOTE — Progress Notes (Signed)
Quick Note:  LVM for patient to return call. ______ 

## 2015-07-27 ENCOUNTER — Encounter: Payer: Self-pay | Admitting: Pharmacist

## 2015-07-28 ENCOUNTER — Ambulatory Visit (INDEPENDENT_AMBULATORY_CARE_PROVIDER_SITE_OTHER): Payer: PPO | Admitting: Medical

## 2015-07-28 ENCOUNTER — Encounter: Payer: Self-pay | Admitting: Medical

## 2015-07-28 ENCOUNTER — Ambulatory Visit: Payer: PPO | Admitting: Pharmacist

## 2015-07-28 VITALS — BP 116/78 | HR 81 | Temp 98.1°F | Ht 62.0 in | Wt 167.8 lb

## 2015-07-28 DIAGNOSIS — L089 Local infection of the skin and subcutaneous tissue, unspecified: Secondary | ICD-10-CM | POA: Diagnosis not present

## 2015-07-28 DIAGNOSIS — L821 Other seborrheic keratosis: Secondary | ICD-10-CM | POA: Diagnosis not present

## 2015-07-28 DIAGNOSIS — L0291 Cutaneous abscess, unspecified: Secondary | ICD-10-CM | POA: Diagnosis not present

## 2015-07-28 DIAGNOSIS — L723 Sebaceous cyst: Secondary | ICD-10-CM | POA: Diagnosis not present

## 2015-07-28 MED ORDER — DOXYCYCLINE HYCLATE 100 MG PO TABS: 100.0000 mg | ORAL_TABLET | Freq: Two times a day (BID) | ORAL | Status: DC

## 2015-07-28 NOTE — Progress Notes (Signed)
Pre visit review using our clinic review tool, if applicable. No additional management support is needed unless otherwise documented below in the visit note. 

## 2015-07-28 NOTE — Progress Notes (Signed)
Subjective:    Patient ID: Victoria [name], female    DOB: [birth year], 60 y.o.   MRN: [id number]  HPI   Pt in with left side edge of hairline area that raised up about 6 days ago. The area is not responding to warm compresses. Pt has no fevers, no chills or sweats. Pt taking advil for pain. This came up after a haircut. Came on very slowly. Now tender and indurated.  Patient has gotten boils before and MRSA before.    Review of Systems  Constitutional: Negative for chills and fatigue.  Respiratory: Negative for cough, chest tightness and wheezing.   Cardiovascular: Negative for chest pain and palpitations.  Gastrointestinal: Negative for abdominal pain.  Skin:       See hpi.  Hematological: Negative for adenopathy. Does not bruise/bleed easily.    Past Medical History  Diagnosis Date  . Asthma   . Obesity   . Maxillary sinusitis   . Osteoarthritis   . IBS (irritable bowel syndrome)   . Hyperlipidemia   . COPD (chronic obstructive pulmonary disease) ([place])   . Pulmonary nodules   . Osteoporosis   . GERD (gastroesophageal reflux disease)   . H/O hiatal hernia   . Depression     mild  . Allergic bronchopulmonary aspergillosis ([place]) 2008    sees Dr Edmund Hilda pulmonology  . Anemia     iron deficiency, resolved  . Normal cardiac stress test 11/2011    No evidence of ischemia or infarct. Calculated ejection fraction 72%.  . OSA (obstructive sleep apnea) 02/2012    has stopped using  cpap  . Anxiety   . Pneumonia 11/2011    "before 2013 I hadn't had pneumonia since I was a child" (04/13/2012)  . Schatzki's ring   . History of echocardiogram     Echo 6/16:  Mod LVH, EF 60-65%, no RWMA, Gr 1 DD, trivial MR, normal LA size.  Marland Kitchen CAD (coronary artery disease)     a. LHC 6/16:  oOM1 60, pRCA 25 >> med Rx  . Diverticulosis     Social History   Social History  . Marital Status: Married    Spouse Name: N/A  . Number of Children: 1  . Years of Education: N/A    Occupational History  .     Social History Main Topics  . Smoking status: Never Smoker   . Smokeless tobacco: Never Used  . Alcohol Use: 2.4 oz/week    4 Glasses of wine per week     Comment: 04/13/2012 "beer in the summer; wine 3-4 times/wk"  . Drug Use: No  . Sexual Activity: Yes     Comment: gluten free, lives with husband and son with CP quadriplegia   Other Topics Concern  . Not on file   Social History Narrative   Cares for a 11yo son with cerebral palsy.     Past Surgical History  Procedure Laterality Date  . Cesarean section  1985  . Hernia repair  04/13/2012    VHR laparoscopic  . Appendectomy  1989  . Ventral hernia repair  04/13/2012    Procedure: LAPAROSCOPIC VENTRAL HERNIA;  Surgeon: Adin Hector, MD;  Location: [facility];  Service: General;  Laterality: N/A;  laparoscopic repair of incarcerated hernia  . Cardiac catheterization N/A 11/25/2014    Procedure: Right/Left Heart Cath and Coronary Angiography;  Surgeon: Belva Crome, MD;  Location: [facility];  Service: Cardiovascular;  Laterality: N/A;  Family History  Problem Relation Age of Onset  . Breast cancer Mother   . Diverticulosis Father   . Prostate cancer Father     prostate  . Pulmonary embolism Brother     recurrent  . Breast cancer Sister     breast  . Heart attack Maternal Grandfather   . Stroke Neg Hx   . Hypertension Mother   . Diabetes Mother     Allergies  Allergen Reactions  . Beclomethasone Dipropionate Hives and Other (See Comments)     weight gain  . Budesonide-Formoterol Fumarate Hives  . Mometasone Furo-Formoterol Fum Hives and Other (See Comments)    weight gain  . Sulfonamide Derivatives Hives and Rash  . Statins     Myalgias, RLS    Current Outpatient Prescriptions on File Prior to Visit  Medication Sig Dispense Refill  . ALPRAZolam (XANAX) 0.5 MG tablet Take 1 tablet (0.5 mg total) by mouth 2 (two) times daily as needed for anxiety (Palpatations, SOB). 30  tablet 1  . aspirin 81 MG tablet Take 81 mg by mouth daily.     . budesonide (PULMICORT FLEXHALER) 180 MCG/ACT inhaler Inhale 2 puffs into the lungs 2 (two) times daily. 3 Inhaler 3  . chlorpheniramine-HYDROcodone (TUSSIONEX PENNKINETIC ER) 10-8 MG/5ML SUER Take 5 mLs by mouth 2 (two) times daily as needed for cough. 140 mL 0  . Cholecalciferol (VITAMIN D) 2000 UNITS CAPS Take 2,000 Units by mouth daily.     . Coenzyme Q10 (CO Q 10 PO) Take 200 mg by mouth 2 (two) times daily.    . fexofenadine (ALLEGRA) 180 MG tablet Take 180 mg by mouth daily as needed for allergies.     . furosemide (LASIX) 20 MG tablet Take 1 tablet (20 mg total) by mouth daily as needed. 30 tablet 1  . HYDROcodone-homatropine (HYCODAN) 5-1.5 MG/5ML syrup Take 5 mLs by mouth every 6 (six) hours as needed for cough.    Marland Kitchen ibuprofen (ADVIL,MOTRIN) 200 MG tablet Take 200 mg by mouth every 6 (six) hours as needed for mild pain or moderate pain.    Marland Kitchen ipratropium-albuterol (DUONEB) 0.5-2.5 (3) MG/3ML SOLN Inhale 3 mLs into the lungs 4 (four) times daily as needed. (Patient taking differently: Inhale 3 mLs into the lungs 4 (four) times daily as needed (wheezing or shortness of breath). ) 360 mL 5  . metoCLOPramide (REGLAN) 10 MG tablet Take 1 tablet (10 mg total) by mouth 4 (four) times daily -  before meals and at bedtime. 90 tablet 2  . mometasone (ASMANEX 60 METERED DOSES) 220 MCG/INH inhaler USE 2 INHALATIONS ORALLY EVERY EVENING 3 Inhaler 1  . montelukast (SINGULAIR) 10 MG tablet TAKE 1 TABLET BY MOUTH TWICE DAILY 180 tablet 1  . nitroGLYCERIN (NITROSTAT) 0.4 MG SL tablet Place 1 tablet (0.4 mg total) under the tongue every 5 (five) minutes as needed for chest pain. 25 tablet 0  . Omeprazole-Sodium Bicarbonate (ZEGERID OTC PO) Take 1 tablet by mouth daily.    Marland Kitchen tiotropium (SPIRIVA HANDIHALER) 18 MCG inhalation capsule INHALE THE CONTENTS OF ONE CAPSULE DAILY 90 capsule 1  . venlafaxine XR (EFFEXOR-XR) 150 MG 24 hr capsule Take 1  capsule (150 mg total) by mouth daily with breakfast. 90 capsule 1   No current facility-administered medications on file prior to visit.    BP 116/78 mmHg  Pulse 81  Temp(Src) 98.1 F (36.7 C) (Oral)  Ht 5\' 2"  (1.575 m)  Wt 167 lb 12.8 oz (76.114 kg)  BMI 30.68 kg/m2  SpO2 98%       Objective:   Physical Exam  General- No acute distress. Pleasant patient. Neck- Full range of motion, no jvd Skin- at hairline left side indurated area 3 cm wide area indurated and red. No fluctuance.      Assessment & Plan:   You appear to have infected sebaceous cyst vs abcess. I got you appointment with dermatologist at 1:20 pm today. Follow up here as needed   Referred to pt dermatologist.

## 2015-07-28 NOTE — Patient Instructions (Addendum)
You appear to have infected sebaceous cyst vs abcess. I got you appointment with dermatologist at 1:20 pm today. Follow up here as needed

## 2015-07-29 ENCOUNTER — Ambulatory Visit: Payer: PPO | Admitting: Medical

## 2015-08-04 ENCOUNTER — Telehealth: Payer: Self-pay | Admitting: Family Medicine

## 2015-08-04 NOTE — Telephone Encounter (Signed)
Patient informed of PCP instructions. 

## 2015-08-04 NOTE — Telephone Encounter (Signed)
MRSA is not usually that invasive and if it were in her brain she would have been sicker. Could be the inflammation just from the immune response could have contributed to muscle tension and increased headache

## 2015-08-04 NOTE — Telephone Encounter (Signed)
Called left message to call back 

## 2015-08-04 NOTE — Telephone Encounter (Signed)
The  Patient was seen last week by Johnnette Gourd. PA and referred to dermatologist for a cyst on her neck.  Path report was it is MRSA.  Before seeing anyone she had been having headaches, unlike headaches she has previously experience.  She is almost done with the doxy and now concerned with the MRSA report that could MRSA be in her head/brain etc. Just concerned.  The headaches are better now though.

## 2015-08-06 ENCOUNTER — Ambulatory Visit: Payer: PPO | Admitting: Pharmacist

## 2015-08-13 ENCOUNTER — Ambulatory Visit: Payer: PPO | Admitting: Pharmacist

## 2015-08-20 ENCOUNTER — Encounter: Payer: Self-pay | Admitting: Medical

## 2015-08-20 ENCOUNTER — Ambulatory Visit: Payer: PPO | Admitting: Pharmacist

## 2015-08-20 ENCOUNTER — Ambulatory Visit (INDEPENDENT_AMBULATORY_CARE_PROVIDER_SITE_OTHER): Payer: PPO | Admitting: Medical

## 2015-08-20 VITALS — BP 112/72 | HR 78 | Temp 98.2°F | Ht 62.5 in | Wt 167.0 lb

## 2015-08-20 DIAGNOSIS — J209 Acute bronchitis, unspecified: Secondary | ICD-10-CM

## 2015-08-20 DIAGNOSIS — R059 Cough, unspecified: Secondary | ICD-10-CM

## 2015-08-20 DIAGNOSIS — R05 Cough: Secondary | ICD-10-CM | POA: Diagnosis not present

## 2015-08-20 MED ORDER — AZITHROMYCIN 250 MG PO TABS: ORAL_TABLET | ORAL | Status: DC

## 2015-08-20 MED ORDER — FLUTICASONE PROPIONATE 50 MCG/ACT NA SUSP: 2.0000 | Freq: Every day | NASAL | Status: DC

## 2015-08-20 MED ORDER — PREDNISONE 10 MG PO TABS: ORAL_TABLET | ORAL | Status: DC

## 2015-08-20 MED ORDER — HYDROCODONE-HOMATROPINE 5-1.5 MG/5ML PO SYRP: 5.0000 mL | ORAL_SOLUTION | Freq: Three times a day (TID) | ORAL | Status: DC | PRN

## 2015-08-20 NOTE — Progress Notes (Signed)
Subjective:    Patient ID: Victoria [name], female    DOB: [birth year], 60 y.o.   MRN: [id number]  HPI  Pt in with cough and some  chest congestion for about 7 days. Pt states has a diffuser with oils and tried lemon with honey.  Pt early on thought viral but know open to idea may need antibiotic.Marland Kitchen Pt takes care of pt with cp. So she does not want to get real sick.   No fever, no chills, sweats or body aches.  Some nasal congestion early on. Pt has used flonase and it does help.  Some productive cough this am.  Pt has not been wheezing. Pt is on azmanex. Pt uses spiriva and has duoneb tx.    Review of Systems  Constitutional: Negative for fever, chills and fatigue.  HENT: Positive for congestion and postnasal drip. Negative for ear pain, nosebleeds, sinus pressure, sneezing, sore throat and tinnitus.   Respiratory: Positive for cough. Negative for shortness of breath.   Cardiovascular: Negative for chest pain and palpitations.  Gastrointestinal: Negative for abdominal pain.  Musculoskeletal: Negative for myalgias and back pain.  Hematological: Negative for adenopathy. Does not bruise/bleed easily.    Past Medical History  Diagnosis Date  . Asthma   . Obesity   . Maxillary sinusitis   . Osteoarthritis   . IBS (irritable bowel syndrome)   . Hyperlipidemia   . COPD (chronic obstructive pulmonary disease) ([place])   . Pulmonary nodules   . Osteoporosis   . GERD (gastroesophageal reflux disease)   . H/O hiatal hernia   . Depression     mild  . 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 Anemia     iron deficiency, resolved  . Normal cardiac stress test 11/2011    No evidence of ischemia or infarct. Calculated ejection fraction 72%.  . OSA (obstructive sleep apnea) 02/2012    has stopped using  cpap  . Anxiety   . Pneumonia 11/2011    "before 2013 I hadn't had pneumonia since I was a child" (04/13/2012)  . Schatzki's ring    . History of echocardiogram     Echo 6/16:  Mod LVH, EF 60-65%, no RWMA, Gr 1 DD, trivial MR, normal LA size.  Marland Kitchen CAD (coronary artery disease)     a. LHC 6/16:  oOM1 60, pRCA 25 >> med Rx  . Diverticulosis     Social History   Social History  . Marital Status: Married    Spouse Name: N/A  . Number of Children: 1  . Years of Education: N/A   Occupational History  .     Social History Main Topics  . Smoking status: Never Smoker   . Smokeless tobacco: Never Used  . Alcohol Use: 2.4 oz/week    4 Glasses of wine per week     Comment: 04/13/2012 "beer in the summer; wine 3-4 times/wk"  . Drug Use: No  . Sexual Activity: Yes     Comment: gluten free, lives with husband and son with CP quadriplegia   Other Topics Concern  . Not on file   Social History Narrative   Cares for a 77yo son with cerebral palsy.     Past Surgical History  Procedure Laterality Date  . Cesarean section  1985  . Hernia repair  04/13/2012    VHR laparoscopic  . Appendectomy  1989  . Ventral hernia repair  04/13/2012    Procedure:  LAPAROSCOPIC VENTRAL HERNIA;  Surgeon: Adin Hector, MD;  Location: [facility];  Service: General;  Laterality: N/A;  laparoscopic repair of incarcerated hernia  . Cardiac catheterization N/A 11/25/2014    Procedure: Right/Left Heart Cath and Coronary Angiography;  Surgeon: Belva Crome, MD;  Location: [facility];  Service: Cardiovascular;  Laterality: N/A;    Family History  Problem Relation Age of Onset  . Breast cancer Mother   . Diverticulosis Father   . Prostate cancer Father     prostate  . Pulmonary embolism Brother     recurrent  . Breast cancer Sister     breast  . Heart attack Maternal Grandfather   . Stroke Neg Hx   . Hypertension Mother   . Diabetes Mother     Allergies  Allergen Reactions  . Beclomethasone Dipropionate Hives and Other (See Comments)     weight gain  . Budesonide-Formoterol Fumarate Hives  . Mometasone Furo-Formoterol Fum  Hives and Other (See Comments)    weight gain  . Sulfonamide Derivatives Hives and Rash  . Statins     Myalgias, RLS    Current Outpatient Prescriptions on File Prior to Visit  Medication Sig Dispense Refill  . ALPRAZolam (XANAX) 0.5 MG tablet Take 1 tablet (0.5 mg total) by mouth 2 (two) times daily as needed for anxiety (Palpatations, SOB). 30 tablet 1  . aspirin 81 MG tablet Take 81 mg by mouth daily.     . budesonide (PULMICORT FLEXHALER) 180 MCG/ACT inhaler Inhale 2 puffs into the lungs 2 (two) times daily. 3 Inhaler 3  . chlorpheniramine-HYDROcodone (TUSSIONEX PENNKINETIC ER) 10-8 MG/5ML SUER Take 5 mLs by mouth 2 (two) times daily as needed for cough. 140 mL 0  . Cholecalciferol (VITAMIN D) 2000 UNITS CAPS Take 2,000 Units by mouth daily.     . Coenzyme Q10 (CO Q 10 PO) Take 200 mg by mouth 2 (two) times daily.    Marland Kitchen doxycycline (VIBRA-TABS) 100 MG tablet Take 1 tablet (100 mg total) by mouth 2 (two) times daily. 20 tablet 0  . fexofenadine (ALLEGRA) 180 MG tablet Take 180 mg by mouth daily as needed for allergies.     . furosemide (LASIX) 20 MG tablet Take 1 tablet (20 mg total) by mouth daily as needed. 30 tablet 1  . HYDROcodone-homatropine (HYCODAN) 5-1.5 MG/5ML syrup Take 5 mLs by mouth every 6 (six) hours as needed for cough.    Marland Kitchen ibuprofen (ADVIL,MOTRIN) 200 MG tablet Take 200 mg by mouth every 6 (six) hours as needed for mild pain or moderate pain.    Marland Kitchen ipratropium-albuterol (DUONEB) 0.5-2.5 (3) MG/3ML SOLN Inhale 3 mLs into the lungs 4 (four) times daily as needed. (Patient taking differently: Inhale 3 mLs into the lungs 4 (four) times daily as needed (wheezing or shortness of breath). ) 360 mL 5  . metoCLOPramide (REGLAN) 10 MG tablet Take 1 tablet (10 mg total) by mouth 4 (four) times daily -  before meals and at bedtime. 90 tablet 2  . mometasone (ASMANEX 60 METERED DOSES) 220 MCG/INH inhaler USE 2 INHALATIONS ORALLY EVERY EVENING 3 Inhaler 1  . montelukast (SINGULAIR) 10 MG  tablet TAKE 1 TABLET BY MOUTH TWICE DAILY 180 tablet 1  . nitroGLYCERIN (NITROSTAT) 0.4 MG SL tablet Place 1 tablet (0.4 mg total) under the tongue every 5 (five) minutes as needed for chest pain. 25 tablet 0  . Omeprazole-Sodium Bicarbonate (ZEGERID OTC PO) Take 1 tablet by mouth daily.    Marland Kitchen  tiotropium (SPIRIVA HANDIHALER) 18 MCG inhalation capsule INHALE THE CONTENTS OF ONE CAPSULE DAILY 90 capsule 1  . venlafaxine XR (EFFEXOR-XR) 150 MG 24 hr capsule Take 1 capsule (150 mg total) by mouth daily with breakfast. 90 capsule 1   No current facility-administered medications on file prior to visit.    BP 112/72 mmHg  Pulse 78  Temp(Src) 98.2 F (36.8 C) (Oral)  Ht 5' 2.5" (1.588 m)  Wt 167 lb (75.751 kg)  BMI 30.04 kg/m2  SpO2 98%       Objective:   Physical Exam  General  Mental Status - Alert. General Appearance - Well groomed. Not in acute distress.  Skin Rashes- No Rashes.  HEENT Head- Normal. Ear Auditory Canal - Left- Normal. Right - Normal.Tympanic Membrane- Left- Normal. Right- Normal. Eye Sclera/Conjunctiva- Left- Normal. Right- Normal. Nose & Sinuses Nasal Mucosa- Left-  Boggy and Congested. Right-  Boggy and  Congested.Bilateral no  maxillary and  No frontal sinus pressure. Mouth & Throat Lips: Upper Lip- Normal: no dryness, cracking, pallor, cyanosis, or vesicular eruption. Lower Lip-Normal: no dryness, cracking, pallor, cyanosis or vesicular eruption. Buccal Mucosa- Bilateral- No Aphthous ulcers. Oropharynx- No Discharge or Erythema. +pnd. Tonsils: Characteristics- Bilateral- No Erythema or Congestion. Size/Enlargement- Bilateral- No enlargement. Discharge- bilateral-None.  Neck Neck- Supple. No Masses.   Chest and Lung Exam Auscultation: Breath Sounds:- even and unlabored. But faint rhonchi upper lobes  Cardiovascular Auscultation:Rythm- Regular, rate and rhythm. Murmurs & Other Heart Sounds:Ausculatation of the heart reveal- No  Murmurs.  Lymphatic Head & Neck General Head & Neck Lymphatics: Bilateral: Description- No Localized lymphadenopathy.       Assessment & Plan:  You appear to have bronchitis. Rest hydrate and tylenol for fever. I am prescribing cough medicine hycodan, and azithromycin antibiotic. For your nasal congestion rx flonase.(pt has used some flonase recently but no reaction)  If symptoms persist then get chest xray.  If any wheezing despite your various asthma treatment then start tapered prednisone.  Follow up in 7-10 days or as needed

## 2015-08-20 NOTE — Patient Instructions (Addendum)
You appear to have bronchitis. Rest hydrate and tylenol for fever. I am prescribing cough medicine hycodan, and azithromycin antibiotic. For your nasal congestion rx flonase.(pt has used some flonase recently but no reaction)  If symptoms persist then get chest xray.  If any wheezing despite your various asthma treatment then start tapered prednisone.  Follow up in 7-10 days or as needed

## 2015-08-20 NOTE — Progress Notes (Signed)
Pre visit review using our clinic review tool, if applicable. No additional management support is needed unless otherwise documented below in the visit note. 

## 2015-08-25 DIAGNOSIS — L0291 Cutaneous abscess, unspecified: Secondary | ICD-10-CM | POA: Diagnosis not present

## 2015-08-25 DIAGNOSIS — L814 Other melanin hyperpigmentation: Secondary | ICD-10-CM | POA: Diagnosis not present

## 2015-08-25 DIAGNOSIS — L723 Sebaceous cyst: Secondary | ICD-10-CM | POA: Diagnosis not present

## 2015-08-25 DIAGNOSIS — L821 Other seborrheic keratosis: Secondary | ICD-10-CM | POA: Diagnosis not present

## 2015-08-25 DIAGNOSIS — Z8614 Personal history of Methicillin resistant Staphylococcus aureus infection: Secondary | ICD-10-CM | POA: Diagnosis not present

## 2015-08-25 DIAGNOSIS — D1800 Hemangioma unspecified site: Secondary | ICD-10-CM | POA: Diagnosis not present

## 2015-08-31 ENCOUNTER — Encounter: Payer: Self-pay | Admitting: Internal Medicine

## 2015-08-31 NOTE — Telephone Encounter (Signed)
Pt needs refill on spiriva and singulair sent in. According to med list, pt takes singulair twice daily but she sent in message stating she has only taken this once daily. Please advise MR if okay to refill thanks

## 2015-09-02 NOTE — Telephone Encounter (Signed)
I think that is fine . Please also let Mileena Tom know that we now have a few patients on nucala injections for asthma and they seem to be having improved symptoms. She as nnitially not interested but if she want to discuss this at next OV I can

## 2015-09-03 MED ORDER — TIOTROPIUM BROMIDE MONOHYDRATE 18 MCG IN CAPS: ORAL_CAPSULE | RESPIRATORY_TRACT | Status: DC

## 2015-09-03 MED ORDER — MONTELUKAST SODIUM 10 MG PO TABS: ORAL_TABLET | ORAL | Status: DC

## 2015-09-18 ENCOUNTER — Encounter: Payer: Self-pay | Admitting: Internal Medicine

## 2015-09-18 ENCOUNTER — Other Ambulatory Visit: Payer: Self-pay | Admitting: Internal Medicine

## 2015-09-18 MED ORDER — CEPHALEXIN 500 MG PO CAPS: 500.0000 mg | ORAL_CAPSULE | Freq: Three times a day (TID) | ORAL | Status: DC

## 2015-09-18 MED ORDER — PREDNISONE 10 MG PO TABS: ORAL_TABLET | ORAL | Status: DC

## 2015-09-18 NOTE — Telephone Encounter (Signed)
Called spoke with pt. Aware of recs below. RX's sent in. Nothing further needed 

## 2015-09-18 NOTE — Telephone Encounter (Signed)
ATC pt. Unable to leave VM. WCB.  

## 2015-09-18 NOTE — Telephone Encounter (Signed)
See phone note 09/18/15

## 2015-09-18 NOTE — Telephone Encounter (Signed)
Patient states that cough is very productive, a lot of beige colored mucus, patient states that she was on 5 days course of Prednisone, but has finished it.  The prednisone helped a lot.  She wants to know if she can get some more prednisone.  Patient has no fever.  [facility]  Allergies  Allergen Reactions  . Beclomethasone Dipropionate Hives and Other (See Comments)     weight gain  . Budesonide-Formoterol Fumarate Hives  . Mometasone Furo-Formoterol Fum Hives and Other (See Comments)    weight gain  . Sulfonamide Derivatives Hives and Rash  . Statins     Myalgias, RLS

## 2015-09-18 NOTE — Telephone Encounter (Signed)
[phone], pt Victoria [name]

## 2015-09-18 NOTE — Telephone Encounter (Signed)
Yes  Recommend  Take prednisone 40 mg daily x 2 days, then 20mg  daily x 2 days, then 10mg  daily x 2 days, then 5mg  daily x 2 days or go back to baseline if she is back on chronic pred  Also cephalrexin 500mg  tid x 5 days    Allergies  Allergen Reactions  . Beclomethasone Dipropionate Hives and Other (See Comments)     weight gain  . Budesonide-Formoterol Fumarate Hives  . Mometasone Furo-Formoterol Fum Hives and Other (See Comments)    weight gain  . Sulfonamide Derivatives Hives and Rash  . Statins     Myalgias, RLS

## 2015-09-24 ENCOUNTER — Other Ambulatory Visit: Payer: Self-pay | Admitting: Family Medicine

## 2015-09-24 DIAGNOSIS — F419 Anxiety disorder, unspecified: Principal | ICD-10-CM

## 2015-09-24 DIAGNOSIS — F329 Major depressive disorder, single episode, unspecified: Secondary | ICD-10-CM

## 2015-09-24 DIAGNOSIS — F32A Depression, unspecified: Secondary | ICD-10-CM

## 2015-09-24 MED ORDER — ALPRAZOLAM 0.5 MG PO TABS: 0.5000 mg | ORAL_TABLET | Freq: Two times a day (BID) | ORAL | Status: DC | PRN

## 2015-09-24 NOTE — Telephone Encounter (Signed)
Faxed hardcopy to [facility]. 

## 2015-09-24 NOTE — Telephone Encounter (Signed)
Requesting:  alprazolam Contract  Signed 07/30/2014 UDS  Low risk Last OV  05/12/2015 Last Refill   #30 with 1 refills on 01/16/2015   Pharmacy is [facility]  Please Advise

## 2015-09-28 ENCOUNTER — Ambulatory Visit (INDEPENDENT_AMBULATORY_CARE_PROVIDER_SITE_OTHER): Payer: PPO | Admitting: Acute Care

## 2015-09-28 ENCOUNTER — Telehealth: Payer: Self-pay | Admitting: Internal Medicine

## 2015-09-28 ENCOUNTER — Encounter: Payer: Self-pay | Admitting: Acute Care

## 2015-09-28 ENCOUNTER — Other Ambulatory Visit: Payer: Self-pay | Admitting: Acute Care

## 2015-09-28 ENCOUNTER — Ambulatory Visit (INDEPENDENT_AMBULATORY_CARE_PROVIDER_SITE_OTHER)
Admission: RE | Admit: 2015-09-28 | Discharge: 2015-09-28 | Disposition: A | Discharge status: Home / Self Care | Payer: PPO | Source: Ambulatory Visit | Attending: Acute Care | Admitting: Acute Care

## 2015-09-28 VITALS — BP 124/72 | HR 88 | Ht 62.0 in | Wt 168.0 lb

## 2015-09-28 DIAGNOSIS — R0600 Dyspnea, unspecified: Secondary | ICD-10-CM | POA: Diagnosis not present

## 2015-09-28 DIAGNOSIS — J441 Chronic obstructive pulmonary disease with (acute) exacerbation: Secondary | ICD-10-CM

## 2015-09-28 DIAGNOSIS — J309 Allergic rhinitis, unspecified: Secondary | ICD-10-CM

## 2015-09-28 DIAGNOSIS — J3089 Other allergic rhinitis: Secondary | ICD-10-CM

## 2015-09-28 DIAGNOSIS — J302 Other seasonal allergic rhinitis: Secondary | ICD-10-CM

## 2015-09-28 MED ORDER — PREDNISONE 10 MG PO TABS: ORAL_TABLET | ORAL | Status: DC

## 2015-09-28 NOTE — Progress Notes (Signed)
Subjective:    Patient ID: Victoria [name], female    DOB: Apr 18, 1956, 60 y.o.   MRN: [id number]  HPI  60 yo female with severe persistent asthma w/ ABPA plus eosinophilia. Seen by Dr. Chase Caller.  Imaging: 09/28/2015:CXR Chronic bronchitic changes. There is no alveolar pneumonia nor CHF.  09/28/2015: Acute office visit: Patient presents to the office today with a one-week history of productive cough, beige to clear mucus, which seems to have correlated with gardening. She states she recently finished antibiotics. Chest x-ray today, reviewed by me, indicates no alveolar pneumonia or CHF. She states she feels tight, with occasional wheezing. She denies fever, orthopnea, chest pain, hemoptysis,. No recent automobile or airline travel. Patient also states she discussed new call injections with her husband, per Dr. Juanell Fairly swami's suggestion. They have decided they would like to see if her insurance approves treatment, as her asthma is persistent despite multiple treatment attempts. Eosinophil count supports that she is a candidate for medication.   Current outpatient prescriptions:  .  ALPRAZolam (XANAX) 0.5 MG tablet, Take 1 tablet (0.5 mg total) by mouth 2 (two) times daily as needed for anxiety (Palpatations, SOB)., Disp: 30 tablet, Rfl: 1 .  aspirin 81 MG tablet, Take 81 mg by mouth daily. , Disp: , Rfl:  .  budesonide (PULMICORT FLEXHALER) 180 MCG/ACT inhaler, Inhale 2 puffs into the lungs 2 (two) times daily., Disp: 3 Inhaler, Rfl: 3 .  Cholecalciferol (VITAMIN D) 2000 UNITS CAPS, Take 2,000 Units by mouth daily. , Disp: , Rfl:  .  Coenzyme Q10 (CO Q 10 PO), Take 200 mg by mouth 2 (two) times daily., Disp: , Rfl:  .  fexofenadine (ALLEGRA) 180 MG tablet, Take 180 mg by mouth daily as needed for allergies. , Disp: , Rfl:  .  fluticasone (FLONASE) 50 MCG/ACT nasal spray, Place 2 sprays into both nostrils daily., Disp: 16 g, Rfl: 1 .  furosemide (LASIX) 20 MG tablet, Take 1 tablet (20 mg total) by  mouth daily as needed., Disp: 30 tablet, Rfl: 1 .  HYDROcodone-homatropine (HYCODAN) 5-1.5 MG/5ML syrup, Take 5 mLs by mouth every 8 (eight) hours as needed for cough., Disp: 120 mL, Rfl: 0 .  ibuprofen (ADVIL,MOTRIN) 200 MG tablet, Take 200 mg by mouth every 6 (six) hours as needed for mild pain or moderate pain., Disp: , Rfl:  .  ipratropium-albuterol (DUONEB) 0.5-2.5 (3) MG/3ML SOLN, Inhale 3 mLs into the lungs 4 (four) times daily as needed. (Patient taking differently: Inhale 3 mLs into the lungs 4 (four) times daily as needed (wheezing or shortness of breath). ), Disp: 360 mL, Rfl: 5 .  metoCLOPramide (REGLAN) 10 MG tablet, Take 1 tablet (10 mg total) by mouth 4 (four) times daily -  before meals and at bedtime., Disp: 90 tablet, Rfl: 2 .  mometasone (ASMANEX 60 METERED DOSES) 220 MCG/INH inhaler, USE 2 INHALATIONS ORALLY EVERY EVENING, Disp: 3 Inhaler, Rfl: 1 .  montelukast (SINGULAIR) 10 MG tablet, TAKE 1 TABLET BY MOUTH once daily, Disp: 90 tablet, Rfl: 1 .  nitroGLYCERIN (NITROSTAT) 0.4 MG SL tablet, Place 1 tablet (0.4 mg total) under the tongue every 5 (five) minutes as needed for chest pain., Disp: 25 tablet, Rfl: 0 .  Omeprazole-Sodium Bicarbonate (ZEGERID OTC PO), Take 1 tablet by mouth daily., Disp: , Rfl:  .  tiotropium (SPIRIVA HANDIHALER) 18 MCG inhalation capsule, INHALE THE CONTENTS OF ONE CAPSULE DAILY, Disp: 90 capsule, Rfl: 1 .  venlafaxine XR (EFFEXOR-XR) 150 MG 24 hr capsule, Take  1 capsule (150 mg total) by mouth daily with breakfast., Disp: 90 capsule, Rfl: 1 .  predniSONE (DELTASONE) 10 MG tablet, 40mg X2 days, 30mg  X2 days, 20mg  X2 days, 10mg X2 days, then stop., Disp: 20 tablet, Rfl: 0   Past Medical History  Diagnosis Date  . Asthma   . Obesity   . Maxillary sinusitis   . Osteoarthritis   . IBS (irritable bowel syndrome)   . Hyperlipidemia   . COPD (chronic obstructive pulmonary disease) ([place])   . Pulmonary nodules   . Osteoporosis   . GERD (gastroesophageal  reflux disease)   . H/O hiatal hernia   . Depression     mild  . Allergic bronchopulmonary aspergillosis ([place]) 2008    sees Dr Edmund Hilda pulmonology  . Anemia     iron deficiency, resolved  . Normal cardiac stress test 11/2011    No evidence of ischemia or infarct. Calculated ejection fraction 72%.  . OSA (obstructive sleep apnea) 02/2012    has stopped using  cpap  . Anxiety   . Pneumonia 11/2011    "before 2013 I hadn't had pneumonia since I was a child" (04/13/2012)  . Schatzki's ring   . History of echocardiogram     Echo 6/16:  Mod LVH, EF 60-65%, no RWMA, Gr 1 DD, trivial MR, normal LA size.  Mar[facility] Kitchen CAD (coronary artery disease)     a. LHC 6/16:  oOM1 60, pRCA 25 >> med Rx  . Diverticulosis     Allergies  Allergen Reactions  . Beclomethasone Dipropionate Hives and Other (See Comments)     weight gain  . Budesonide-Formoterol Fumarate Hives  . Mometasone Furo-Formoterol Fum Hives and Other (See Comments)    weight gain  . Sulfonamide Derivatives Hives and Rash  . Statins     Myalgias, RLS     Review of Systems    Constitutional:   No  weight loss, night sweats,  Fevers, chills, fatigue, or  lassitude.  HEENT:   No headaches,  Difficulty swallowing,  Tooth/dental problems, or  Sore throat,                No sneezing, itching, ear ache, nasal congestion, + post nasal drip,   CV:  No chest pain,  Orthopnea, PND, swelling in lower extremities, anasarca, dizziness, palpitations, syncope.   GI  No heartburn, indigestion, abdominal pain, nausea, vomiting, diarrhea, change in bowel habits, loss of appetite, bloody stools.   Resp: + shortness of breath with exertion not at rest.  + excess mucus, no productive cough,  No non-productive cough,  No coughing up of blood.  No change in color of mucus.  + wheezing.  No chest wall deformity  Skin: no rash or lesions.  GU: no dysuria, change in color of urine, no urgency or frequency.  No flank pain, no hematuria   MS:   No joint pain or swelling.  No decreased range of motion.  No back pain.  Psych:  No change in mood or affect. No depression or anxiety.  No memory loss.     Objective:   Physical Exam BP 124/72 mmHg  Pulse 88  Ht 5\' 2"  (1.575 m)  Wt 168 lb (76.204 kg)  BMI 30.72 kg/m2  SpO2 99% Physical Exam:  General- No distress,  A&Ox3 ENT: No sinus tenderness, TM clear, pale nasal mucosa, no oral exudate,+ post nasal drip, no LAN Cardiac: S1, S2, regular rate and rhythm, no murmur Chest: No wheeze/ rales/ dullness; no accessory  muscle use, no nasal flaring, no sternal retractions Abd.: Soft Non-tender Ext: No clubbing cyanosis, edema Neuro:  normal strength Skin: No rashes, warm and dry Psych: normal mood and behavior  Magdalen Spatz, AGACNP-BC [place] Pager # [phone] 09/28/2015     Assessment & Plan:

## 2015-09-28 NOTE — Assessment & Plan Note (Signed)
Acute bronchitis flare of seasonal allergies is trigger: Plan: We will repeat the prednisone taper. Prednisone taper; 10 mg tablets: 4 tabs x 2 days, 3 tabs x 2 days, 2 tabs x 2 days 1 tab x 2 days then stop. CXR today, reviewed personally by me. Please start taking your Allegra for seasonal allergies daily. Please start taking Flonase nasal spray.2 sprays in each nostril once daily Use nasal saline as needed for nasal congestion Continue using your Spiriva and Singulair as prescribed and directed by Dr. Chase Caller. We will start the process for approval for Nucala injections through your insurance. Please review the informational book we have provided you with today. Follow up with Dr. Chase Caller at next available appointment. Please contact office for sooner follow up if symptoms do not improve or worsen or seek emergency care

## 2015-09-28 NOTE — Patient Instructions (Addendum)
It is nice to meet you today. We will repeat the prednisone taper. Prednisone taper; 10 mg tablets: 4 tabs x 2 days, 3 tabs x 2 days, 2 tabs x 2 days 1 tab x 2 days then stop. CXR today Please start taking your Allegra for seasonal allergies daily. Please start taking Flonase nasal spray.2 sprays in each nostril once daily Use nasal saline as needed for nasal congestion Continue using your Spiriva and Singulair as prescribed and directed by Dr. Chase Caller. We will start the process for approval for Nucala injections through your insurance. Please review the informational book we have provided you with today. Follow up with Dr. Chase Caller at next available appointment. Please contact office for sooner follow up if symptoms do not improve or worsen or seek emergency care

## 2015-09-28 NOTE — Telephone Encounter (Signed)
Spoke with pt. Reports increased DOE for the past few days. States that she feels like she has PNA. She has been scheduled to see SG today 3pm. Nothing further was needed.

## 2015-09-29 DIAGNOSIS — Z124 Encounter for screening for malignant neoplasm of cervix: Secondary | ICD-10-CM | POA: Diagnosis not present

## 2015-09-29 DIAGNOSIS — Z6829 Body mass index (BMI) 29.0-29.9, adult: Secondary | ICD-10-CM | POA: Diagnosis not present

## 2015-09-29 DIAGNOSIS — Z01419 Encounter for gynecological examination (general) (routine) without abnormal findings: Secondary | ICD-10-CM | POA: Diagnosis not present

## 2015-09-29 DIAGNOSIS — Z1231 Encounter for screening mammogram for malignant neoplasm of breast: Secondary | ICD-10-CM | POA: Diagnosis not present

## 2015-10-05 ENCOUNTER — Other Ambulatory Visit: Payer: Self-pay | Admitting: Family Medicine

## 2015-10-05 NOTE — Telephone Encounter (Signed)
Refilled patients rx request for #90 with 1 rf

## 2015-10-22 ENCOUNTER — Ambulatory Visit: Payer: Commercial Managed Care - HMO | Admitting: Family Medicine

## 2015-11-04 ENCOUNTER — Encounter: Payer: Self-pay | Admitting: Internal Medicine

## 2015-11-05 ENCOUNTER — Ambulatory Visit: Payer: PPO | Admitting: Internal Medicine

## 2015-11-19 ENCOUNTER — Telehealth: Payer: Self-pay

## 2015-11-20 ENCOUNTER — Encounter: Payer: Self-pay | Admitting: Family Medicine

## 2015-11-20 ENCOUNTER — Ambulatory Visit (INDEPENDENT_AMBULATORY_CARE_PROVIDER_SITE_OTHER): Payer: PPO | Admitting: Family Medicine

## 2015-11-20 VITALS — BP 120/82 | HR 93 | Temp 98.7°F | Ht 62.0 in | Wt 167.0 lb

## 2015-11-20 DIAGNOSIS — R030 Elevated blood-pressure reading, without diagnosis of hypertension: Secondary | ICD-10-CM

## 2015-11-20 DIAGNOSIS — E669 Obesity, unspecified: Secondary | ICD-10-CM | POA: Diagnosis not present

## 2015-11-20 DIAGNOSIS — K219 Gastro-esophageal reflux disease without esophagitis: Secondary | ICD-10-CM

## 2015-11-20 DIAGNOSIS — E785 Hyperlipidemia, unspecified: Secondary | ICD-10-CM | POA: Diagnosis not present

## 2015-11-20 DIAGNOSIS — M899 Disorder of bone, unspecified: Secondary | ICD-10-CM

## 2015-11-20 DIAGNOSIS — M949 Disorder of cartilage, unspecified: Secondary | ICD-10-CM

## 2015-11-20 DIAGNOSIS — R739 Hyperglycemia, unspecified: Secondary | ICD-10-CM

## 2015-11-20 DIAGNOSIS — J4550 Severe persistent asthma, uncomplicated: Secondary | ICD-10-CM

## 2015-11-20 HISTORY — DX: Hyperglycemia, unspecified: R73.9

## 2015-11-20 LAB — COMPREHENSIVE METABOLIC PANEL
ALK PHOS: 92 U/L (ref 39–117)
ALT: 16 U/L (ref 0–35)
AST: 18 U/L (ref 0–37)
Albumin: 4.4 g/dL (ref 3.5–5.2)
BUN: 14 mg/dL (ref 6–23)
CHLORIDE: 102 meq/L (ref 96–112)
CO2: 28 mEq/L (ref 19–32)
Calcium: 9.8 mg/dL (ref 8.4–10.5)
Creatinine, Ser: 0.71 mg/dL (ref 0.40–1.20)
GFR: 89.7 mL/min (ref >60.00)
GLUCOSE: 84 mg/dL (ref 70–99)
POTASSIUM: 3.9 meq/L (ref 3.5–5.1)
SODIUM: 140 meq/L (ref 135–145)
TOTAL PROTEIN: 7.2 g/dL (ref 6.0–8.3)
Total Bilirubin: 0.5 mg/dL (ref 0.2–1.2)

## 2015-11-20 LAB — CBC
HEMATOCRIT: 40.6 % (ref 36.0–46.0)
Hemoglobin: 13.4 g/dL (ref 12.0–15.0)
MCHC: 32.9 g/dL (ref 30.0–36.0)
MCV: 89.7 fl (ref 78.0–100.0)
Platelets: 352 10*3/uL (ref 150.0–400.0)
RBC: 4.53 Mil/uL (ref 3.87–5.11)
RDW: 14.3 % (ref 11.5–15.5)
WBC: 7.6 10*3/uL (ref 4.0–10.5)

## 2015-11-20 LAB — LIPID PANEL
CHOLESTEROL: 259 mg/dL — AB (ref 0–200)
HDL: 52.1 mg/dL (ref >39.00)
LDL Cholesterol: 182 mg/dL — ABNORMAL HIGH (ref 0–99)
NonHDL: 206.81
TRIGLYCERIDES: 125 mg/dL (ref 0.0–149.0)
Total CHOL/HDL Ratio: 5
VLDL: 25 mg/dL (ref 0.0–40.0)

## 2015-11-20 LAB — HEMOGLOBIN A1C: Hgb A1c MFr Bld: 5.5 % (ref 4.6–6.5)

## 2015-11-20 LAB — TSH: TSH: 1.78 u[IU]/mL (ref 0.35–4.50)

## 2015-11-20 NOTE — Progress Notes (Signed)
Pre visit review using our clinic review tool, if applicable. No additional management support is needed unless otherwise documented below in the visit note. 

## 2015-11-20 NOTE — Patient Instructions (Signed)

## 2015-11-20 NOTE — Telephone Encounter (Signed)
Pre visit call completed 

## 2015-11-22 ENCOUNTER — Encounter: Payer: Self-pay | Admitting: Family Medicine

## 2015-11-23 ENCOUNTER — Encounter: Payer: Self-pay | Admitting: Internal Medicine

## 2015-11-23 ENCOUNTER — Encounter: Payer: Self-pay | Admitting: Family Medicine

## 2015-11-23 NOTE — Telephone Encounter (Signed)
MR please advise. Thanks! 

## 2015-11-23 NOTE — Telephone Encounter (Signed)
Fine for predn burst. But I forgot if she is off daily prednisone. You can do  Take prednisone 40 mg daily x 2 days, then 20mg  daily x 2 days, then 10mg  daily x 2 days, then 5mg  daily x 2 days and stop or contineue baseline as case maybe

## 2015-11-24 ENCOUNTER — Ambulatory Visit (INDEPENDENT_AMBULATORY_CARE_PROVIDER_SITE_OTHER): Payer: PPO | Admitting: Internal Medicine

## 2015-11-24 ENCOUNTER — Encounter: Payer: Self-pay | Admitting: Internal Medicine

## 2015-11-24 ENCOUNTER — Other Ambulatory Visit: Payer: Self-pay | Admitting: Internal Medicine

## 2015-11-24 ENCOUNTER — Other Ambulatory Visit: Payer: Self-pay

## 2015-11-24 VITALS — BP 128/74 | HR 80 | Ht 62.0 in | Wt 167.6 lb

## 2015-11-24 DIAGNOSIS — B4481 Allergic bronchopulmonary aspergillosis: Secondary | ICD-10-CM

## 2015-11-24 DIAGNOSIS — J82 Pulmonary eosinophilia, not elsewhere classified: Secondary | ICD-10-CM | POA: Diagnosis not present

## 2015-11-24 DIAGNOSIS — J4551 Severe persistent asthma with (acute) exacerbation: Secondary | ICD-10-CM

## 2015-11-24 DIAGNOSIS — J8283 Eosinophilic asthma: Secondary | ICD-10-CM

## 2015-11-24 DIAGNOSIS — J4550 Severe persistent asthma, uncomplicated: Secondary | ICD-10-CM | POA: Diagnosis not present

## 2015-11-24 LAB — NITRIC OXIDE: Nitric Oxide: 126

## 2015-11-24 MED ORDER — PREDNISONE 10 MG PO TABS: ORAL_TABLET | ORAL | Status: DC

## 2015-11-24 MED ORDER — METHYLPREDNISOLONE ACETATE 80 MG/ML IJ SUSP
80.0000 mg | Freq: Once | INTRAMUSCULAR | Status: AC
  Administered 2015-11-24: 80 mg via INTRAMUSCULAR

## 2015-11-24 MED ORDER — PREDNISONE 10 MG PO TABS
ORAL_TABLET | ORAL | Status: DC
Start: 2015-11-24 — End: 2015-11-24

## 2015-11-24 NOTE — Patient Instructions (Addendum)
ICD-9-CM ICD-10-CM   1. 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[place]) Q000111Q J82   2. Asthma, severe persistent, uncomplicated 123456 [id number]   3. A B P A-ALLERGIC BRONCHOPULMONARY ASPERGILLOSIS 518.6 B44.81   4. Asthma, severe persistent, with acute exacerbation 493.92 J45.51      #Asthma + ABPA with bronchiectassis + eosinophilia -in exacerbation - multiple recurrent exacerbations needing short course prednisone since cming off daily prednisone in 2016  PLAN - Depot medrol 80mg  IM x 1 11/24/2015 in office  - Take prednisone 40 mg daily x 2 days, then 20mg  daily x 2 days, then 10mg  daily x 2 days, then 5mg  daily x 2 days and stop - refill duoneb - Continue Allegra plus DuoNeb plus Pulmicort mdi plus Singulair plus Zegerid plus Spiriva plus Nasacort - START NUCALA - [name] doing paper work =- Shingles vaccine ASAP before Nucala - do with PCP [name], MD  #Followup  - 2-3 months  -  FeNO and ACQ at [place]  -

## 2015-11-24 NOTE — Progress Notes (Signed)
Subjective:     Patient ID: Victoria [name], female   DOB: 1955-11-07, 60 y.o.   MRN: [id number]  HPI    OV 11/24/2015  Chief Complaint  Patient presents with  . Follow-up    Pt c/o chest tightness, wheezing, sob, nonprod coughing spells X1 week.  pt has been taking regular neb treatments.     Follow-up severe persistent asthma with significant eosinophilia and associated ABPA. She came off daily prednisone after many years November 2016 but since then has had multiple exacerbations requiring several courses of prednisone. Now for unclear reasons she tells me for the last 1 week she's had worsening asthma symptoms. Asthma control 5. questionnaire is 4.8 showing significant amount of symptoms. Exhaled nitric oxide is up at 120 which is more severe than her baseline elevated state of 80. All features fit in with an exacerbation. She is waking up a great many times at night with coughing and wheezing. She's sleeping in a recliner. When she wakes up she is quite severe symptoms. Activities are very limited because of asthma and she's experiencing a great deal of shortness of breath because of asthma and the past week she is wheezing most of the time and using albuterol rescue multiple times.  She is no longer on dual inhaler corticosteroid therapy and double dose Singulair  In the psat she had declined NUCALA due to fear of side effects and the need for shingles vaccine but at this point because of multiple exacerbations and after some thought and discussion with the husband she's decided to go ahead. Paperwork is being done. She is seeking medical assistance for co-pay and once as approved she will start this therapy.     Feno - 80 in Nov 2016 and Jan 2017 but 120 11/24/2015 and up  Lab review those 6?2!@7  shows normal creatinine and white count were noted as normal    Asthma Control Panel 11/24/2015   Current Med Regimen   ACQ 5 point- 1 week. wtd avg score. <1.0 is good control 0.75-1.25 is grey  zone. >1.25 poor control. Delta 0.5 is clinically meaningful 4.8  ACQ 7 point - 1 week. wtd avg score. <1.0 is good control 0.75-1.25 is grey zone. >1.25 poor control. Delta 0.5 is clinically meaningful x  ACT - a [employer] test - 4 week. Total score. Max is 25, Lower score is worse.  <19 = poor control x  FeNO ppB 120  FeV1    Planned intervention  for visit pred burst + start nucala        has a past medical history of Asthma; Obesity; Maxillary sinusitis; Osteoarthritis; IBS (irritable bowel syndrome); Hyperlipidemia; COPD (chronic obstructive pulmonary disease) ([place]); Pulmonary nodules; Osteoporosis; GERD (gastroesophageal reflux disease); H/O hiatal hernia; Depression; Allergic bronchopulmonary aspergillosis ([place]) (2008); Anemia; Normal cardiac stress test (11/2011); OSA (obstructive sleep apnea) (02/2012); Anxiety; Pneumonia (11/2011); Schatzki's ring; History of echocardiogram; CAD (coronary artery disease); Diverticulosis; and Hyperglycemia (11/20/2015).   reports that she has never smoked. She has never used smokeless tobacco.  Past Surgical History  Procedure Laterality Date  . Cesarean section  1985  . Hernia repair  04/13/2012    VHR laparoscopic  . Appendectomy  1989  . Ventral hernia repair  04/13/2012    Procedure: LAPAROSCOPIC VENTRAL HERNIA;  Surgeon: Adin Hector, MD;  Location: [facility];  Service: General;  Laterality: N/A;  laparoscopic repair of incarcerated hernia  . Cardiac catheterization N/A 11/25/2014    Procedure: Right/Left Heart Cath and Coronary Angiography;  Surgeon: Belva Crome, MD;  Location: [facility];  Service: Cardiovascular;  Laterality: N/A;    Allergies  Allergen Reactions  . Beclomethasone Dipropionate Hives and Other (See Comments)     weight gain  . Budesonide-Formoterol Fumarate Hives  . Mometasone Furo-Formoterol Fum Hives and Other (See Comments)    weight gain  . Sulfonamide Derivatives Hives and Rash  . Statins     Myalgias, RLS     Immunization History  Administered Date(s) Administered  . Influenza Split 04/20/2011  . Influenza Whole 06/06/2007, 04/15/2008, 04/02/2009, 03/29/2012  . Influenza, High Dose Seasonal PF 05/05/2015  . Influenza,inj,Quad PF,36+ Mos 05/09/2013, 03/03/2014  . Pneumococcal Conjugate-13 05/09/2013  . Pneumococcal Polysaccharide-23 05/04/2005  . Td 07/29/2009  . Tdap 03/11/2015    Family History  Problem Relation Age of Onset  . Breast cancer Mother   . Diverticulosis Father   . Prostate cancer Father     prostate  . Pulmonary embolism Brother     recurrent  . Breast cancer Sister     breast  . Heart attack Maternal Grandfather   . Stroke Neg Hx   . Hypertension Mother   . Diabetes Mother      Current outpatient prescriptions:  .  ALPRAZolam (XANAX) 0.5 MG tablet, Take 1 tablet (0.5 mg total) by mouth 2 (two) times daily as needed for anxiety (Palpatations, SOB)., Disp: 30 tablet, Rfl: 1 .  aspirin 81 MG tablet, Take 81 mg by mouth daily. , Disp: , Rfl:  .  budesonide (PULMICORT FLEXHALER) 180 MCG/ACT inhaler, Inhale 2 puffs into the lungs 2 (two) times daily., Disp: 3 Inhaler, Rfl: 3 .  Cholecalciferol (VITAMIN D) 2000 UNITS CAPS, Take 2,000 Units by mouth daily. , Disp: , Rfl:  .  Coenzyme Q10 (CO Q 10 PO), Take 200 mg by mouth 2 (two) times daily., Disp: , Rfl:  .  fexofenadine (ALLEGRA) 180 MG tablet, Take 180 mg by mouth daily as needed for allergies. , Disp: , Rfl:  .  fluticasone (FLONASE) 50 MCG/ACT nasal spray, Place 2 sprays into both nostrils daily., Disp: 16 g, Rfl: 1 .  furosemide (LASIX) 20 MG tablet, Take 1 tablet (20 mg total) by mouth daily as needed., Disp: 30 tablet, Rfl: 1 .  HYDROcodone-homatropine (HYCODAN) 5-1.5 MG/5ML syrup, Take 5 mLs by mouth every 8 (eight) hours as needed for cough., Disp: 120 mL, Rfl: 0 .  ibuprofen (ADVIL,MOTRIN) 200 MG tablet, Take 200 mg by mouth every 6 (six) hours as needed for mild pain or moderate pain., Disp: , Rfl:  .   ipratropium-albuterol (DUONEB) 0.5-2.5 (3) MG/3ML SOLN, Inhale 3 mLs into the lungs 4 (four) times daily as needed. (Patient taking differently: Inhale 3 mLs into the lungs 4 (four) times daily as needed (wheezing or shortness of breath). ), Disp: 360 mL, Rfl: 5 .  mometasone (ASMANEX 60 METERED DOSES) 220 MCG/INH inhaler, USE 2 INHALATIONS ORALLY EVERY EVENING, Disp: 3 Inhaler, Rfl: 1 .  montelukast (SINGULAIR) 10 MG tablet, TAKE 1 TABLET BY MOUTH once daily, Disp: 90 tablet, Rfl: 1 .  nitroGLYCERIN (NITROSTAT) 0.4 MG SL tablet, Place 1 tablet (0.4 mg total) under the tongue every 5 (five) minutes as needed for chest pain., Disp: 25 tablet, Rfl: 0 .  Omega-3 Fat Ac-Cholecalciferol (OMEGA-3 + VITAMIN D3 ULTRA STR) LIQD, Take 1 Units by mouth daily. 1 teaspoon daily- 1600mg  omega 3, Disp: , Rfl:  .  Omeprazole-Sodium Bicarbonate (ZEGERID OTC PO), Take 1 tablet by mouth  daily., Disp: , Rfl:  .  tiotropium (SPIRIVA HANDIHALER) 18 MCG inhalation capsule, INHALE THE CONTENTS OF ONE CAPSULE DAILY, Disp: 90 capsule, Rfl: 1 .  venlafaxine XR (EFFEXOR-XR) 150 MG 24 hr capsule, TAKE 1 CAPSULE BY MOUTH DAILY WITH BREAKFAST, Disp: 90 capsule, Rfl: 1     Review of Systems     Objective:   Physical Exam  Constitutional: She is oriented to person, place, and time. She appears well-developed and well-nourished. No distress.  HENT:  Head: Normocephalic and atraumatic.  Right Ear: External ear normal.  Left Ear: External ear normal.  Mouth/Throat: Oropharynx is clear and moist. No oropharyngeal exudate.  Eyes: Conjunctivae and EOM are normal. Pupils are equal, round, and reactive to light. Right eye exhibits no discharge. Left eye exhibits no discharge. No scleral icterus.  Neck: Normal range of motion. Neck supple. No JVD present. No tracheal deviation present. No thyromegaly present.  Cardiovascular: Normal rate, regular rhythm, normal heart sounds and intact distal pulses.  Exam reveals no gallop and no  friction rub.   No murmur heard. Pulmonary/Chest: Effort normal. No respiratory distress. She has wheezes. She has no rales. She exhibits no tenderness.  She actually has wheeze at this time which is unusual for her even an exacerbation suggesting this exacerbation is a little more severe than in the past  Abdominal: Soft. Bowel sounds are normal. She exhibits no distension and no mass. There is no tenderness. There is no rebound and no guarding.  Musculoskeletal: Normal range of motion. She exhibits no edema or tenderness.  Lymphadenopathy:    She has no cervical adenopathy.  Neurological: She is alert and oriented to person, place, and time. She has normal reflexes. No cranial nerve deficit. She exhibits normal muscle tone. Coordination normal.  Skin: Skin is warm and dry. No rash noted. She is not diaphoretic. No erythema. No pallor.  Psychiatric: She has a normal mood and affect. Her behavior is normal. Judgment and thought content normal.  Vitals reviewed.   Filed Vitals:   11/24/15 1132  BP: 128/74  Pulse: 80  Height: 5\' 2"  (1.575 m)  Weight: 167 lb 9.6 oz (76.023 kg)  SpO2: 99%   Wheeze +        Assessment:       ICD-9-CM ICD-10-CM   1. 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[place]) Q000111Q J82   2. Asthma, severe persistent, uncomplicated 123456 [id number] CANCELED: Nitric oxide  3. A B P A-ALLERGIC BRONCHOPULMONARY ASPERGILLOSIS 518.6 B44.81   4. Asthma, severe persistent, with acute exacerbation 493.92 J45.51 Nitric oxide     methylPREDNISolone acetate (DEPO-MEDROL) injection 80 mg       Plan:       #Asthma + ABPA with bronchiectassis + eosinophilia -in exacerbation - multiple recurrent exacerbations needing short course prednisone since cming off daily prednisone in 2016  PLAN - Depot medrol 80mg  IM x 1 11/24/2015 in office  - Take prednisone 40 mg daily x 2 days, then 20mg  daily x 2 days, then 10mg  daily x 2 days, then 5mg  daily x 2 days and stop - refill duoneb - Continue Allegra  plus DuoNeb plus Pulmicort mdi plus Singulair plus Zegerid plus Spiriva plus Nasacort - START NUCALA - [name] doing paper work =- Shingles vaccine ASAP before Nucala - do with PCP [name], MD  #Followup  - 2-3 months  -  FeNO and ACQ at [place]  -   Dr. Brand Males, M.D., [facility].C.P Pulmonary and Critical Care Medicine Staff Physician [place]  [facility] Pager: [phone], If no answer or between  15:00h - 7:00h: call [phone]  11/24/2015 5:33 PM

## 2015-11-25 ENCOUNTER — Encounter: Payer: Self-pay | Admitting: Internal Medicine

## 2015-11-25 ENCOUNTER — Telehealth: Payer: Self-pay | Admitting: Internal Medicine

## 2015-11-25 NOTE — Telephone Encounter (Signed)
  I looked up textbook called UPTODATE  Concern is because -   zoster vaccine are live, attenuated strains of varicella-zoster virus.   RULES are   1)  Use is contraindicated in severely immunocompromised patients (eg, patients receiving chemo-/radiation therapy or other immunosuppressive therapy [including high-dose corticosteroids]); may have a reduced response to vaccination. Persons with AIDS or manifestations of HIV with CD4+ T-lymphocyte counts ?200 cells/microliter or CD4+ T-lymphocyte percentages ?15% should not be vaccinated. - this does not apply to [facility]   2) Patients receiving corticosteroids in low-to-moderate doses, topical (inhaled, nasal, skin), local injection (intra-articular, bursal, tendon) may receive vaccine. - this will apply to Marya Fossa   3) In general, household and close contacts of persons with altered immunocompetence may receive all age-appropriate vaccines (CDC/ACIP [[place], 2008]; NCIRD/ACIP, 2011). - this will apply to Marya Fossa in sutation of her son  LEt her know I am happy to talk to PCP [name], MD about this. IF she is stil nervous about zoster vaccine then she can just go ahead with nucala without vaccine

## 2015-11-25 NOTE — Telephone Encounter (Signed)
This response has been sent to the pt via MyChart. Her original message was sent through [place].

## 2015-11-25 NOTE — Telephone Encounter (Signed)
   Point #2 means that though she is asthmatic and is on steroids - ok for her to get shingles vaccine Point #3 means that though her son has health issues is ok for her to get shingles vaccines  If she is still confused - I Can talk to her or her pcp if she still has question. I am going by the book

## 2015-11-25 NOTE — Telephone Encounter (Signed)
MR please advise. Thanks! 

## 2015-11-26 ENCOUNTER — Encounter: Payer: Self-pay | Admitting: Internal Medicine

## 2015-11-26 NOTE — Telephone Encounter (Signed)
Dr. Ramaswamy, please advise. °

## 2015-11-26 NOTE — Telephone Encounter (Signed)
Ok change prednisone to Please take Take prednisone 40mg  once daily x 4 days, then 30mg  once daily x 4 days, then 20mg  once daily x 4 days, then prednisone 10mg  once daily  x 4 days and prednisone 5mg  daily x 4 days to stop or continue; she can decide

## 2015-11-27 ENCOUNTER — Telehealth: Payer: Self-pay | Admitting: Family Medicine

## 2015-11-27 MED ORDER — DOXYCYCLINE HYCLATE 100 MG PO TABS: 100.0000 mg | ORAL_TABLET | Freq: Two times a day (BID) | ORAL | Status: DC

## 2015-11-27 MED ORDER — PREDNISONE 10 MG PO TABS: ORAL_TABLET | ORAL | Status: DC

## 2015-11-27 NOTE — Telephone Encounter (Signed)
Antibiotic sent in and patient informed 

## 2015-11-27 NOTE — Telephone Encounter (Addendum)
Relation to [id number] Call back number:[phone] Pharmacy: [facility], [place] [phone] (Phone) [phone] (Fax)         Reason for call:  Reoccurring boil on stomach and patient would like doxycycline (VIBRA-TABS) tablet prescribed.

## 2015-11-27 NOTE — Telephone Encounter (Signed)
docycycline 100 mg po bid x 10 days

## 2015-11-29 NOTE — Assessment & Plan Note (Signed)
Notes ongoing cough at night. Nonproductive. Is switching from Asmanex to Pulmicort if her cough persists after that she will follow up with pulmonology

## 2015-11-29 NOTE — Assessment & Plan Note (Signed)
Avoid offending foods, start probiotics. Do not eat large meals in late evening and consider raising head of bed.  

## 2015-11-29 NOTE — Assessment & Plan Note (Signed)
Encouraged DASH diet, decrease po intake and increase exercise as tolerated. Needs 7-8 hours of sleep nightly. Avoid trans fats, eat small, frequent meals every 4-5 hours with lean proteins, complex carbs and healthy fats. Minimize simple carbs 

## 2015-11-29 NOTE — Assessment & Plan Note (Signed)
Encouraged heart healthy diet, increase exercise, avoid trans fats, consider a krill oil cap daily 

## 2015-11-29 NOTE — Progress Notes (Signed)
Patient ID: Victoria [name], female   DOB: 05/03/1956, 60 y.o.   MRN: [id number]   Subjective:    Patient ID: Victoria [name], female    DOB: [birth year], 60 y.o.   MRN: [id number]  Chief Complaint  Patient presents with  . Follow-up    HPI Patient is in today for follow up. She is mostly doing well. Is struggling with right knee painand crepitus. No recent injury or redness. She also notes a cough that is nonproductive and worse qhs. No fevers, chills, recent illness otherwise. Denies CP/palp/HA/congestion/fevers/GI or GU c/o. Taking meds as prescribed. 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   Past Medical History  Diagnosis Date  . Asthma   . Obesity   . Maxillary sinusitis   . Osteoarthritis   . IBS (irritable bowel syndrome)   . Hyperlipidemia   . COPD (chronic obstructive pulmonary disease) ([place])   . Pulmonary nodules   . Osteoporosis   . GERD (gastroesophageal reflux disease)   . H/O hiatal hernia   . Depression     mild  . Allergic bronchopulmonary aspergillosis ([place]) 2008    sees Dr Edmund Hilda pulmonology  . Anemia     iron deficiency, resolved  . Normal cardiac stress test 11/2011    No evidence of ischemia or infarct. Calculated ejection fraction 72%.  . OSA (obstructive sleep apnea) 02/2012    has stopped using  cpap  . Anxiety   . Pneumonia 11/2011    "before 2013 I hadn't had pneumonia since I was a child" (04/13/2012)  . Schatzki's ring   . History of echocardiogram     Echo 6/16:  Mod LVH, EF 60-65%, no RWMA, Gr 1 DD, trivial MR, normal LA size.  Marland Kitchen CAD (coronary artery disease)     a. LHC 6/16:  oOM1 60, pRCA 25 >> med Rx  . Diverticulosis   . Hyperglycemia 11/20/2015    Past Surgical History  Procedure Laterality Date  . Cesarean section  1985  . Hernia repair  04/13/2012    VHR laparoscopic  . Appendectomy  1989  . Ventral hernia repair  04/13/2012    Procedure: LAPAROSCOPIC VENTRAL HERNIA;  Surgeon: Adin Hector,  MD;  Location: [facility];  Service: General;  Laterality: N/A;  laparoscopic repair of incarcerated hernia  . Cardiac catheterization N/A 11/25/2014    Procedure: Right/Left Heart Cath and Coronary Angiography;  Surgeon: Belva Crome, MD;  Location: [facility];  Service: Cardiovascular;  Laterality: N/A;    Family History  Problem Relation Age of Onset  . Breast cancer Mother   . Diverticulosis Father   . Prostate cancer Father     prostate  . Pulmonary embolism Brother     recurrent  . Breast cancer Sister     breast  . Heart attack Maternal Grandfather   . Stroke Neg Hx   . Hypertension Mother   . Diabetes Mother     Social History   Social History  . Marital Status: Married    Spouse Name: N/A  . Number of Children: 1  . Years of Education: N/A   Occupational History  .     Social History Main Topics  . Smoking status: Never Smoker   . Smokeless tobacco: Never Used  . Alcohol Use: 2.4 oz/week    4 Glasses of wine per week     Comment: 04/13/2012 "beer in the summer; wine 3-4 times/wk"  . Drug Use:  No  . Sexual Activity: Yes     Comment: gluten free, lives with husband and son with CP quadriplegia   Other Topics Concern  . Not on file   Social History Narrative   Cares for a 52yo son with cerebral palsy.     Outpatient Prescriptions Prior to Visit  Medication Sig Dispense Refill  . ALPRAZolam (XANAX) 0.5 MG tablet Take 1 tablet (0.5 mg total) by mouth 2 (two) times daily as needed for anxiety (Palpatations, SOB). 30 tablet 1  . aspirin 81 MG tablet Take 81 mg by mouth daily.     . budesonide (PULMICORT FLEXHALER) 180 MCG/ACT inhaler Inhale 2 puffs into the lungs 2 (two) times daily. 3 Inhaler 3  . Cholecalciferol (VITAMIN D) 2000 UNITS CAPS Take 2,000 Units by mouth daily.     . Coenzyme Q10 (CO Q 10 PO) Take 200 mg by mouth 2 (two) times daily.    . fexofenadine (ALLEGRA) 180 MG tablet Take 180 mg by mouth daily as needed for allergies.     . fluticasone  (FLONASE) 50 MCG/ACT nasal spray Place 2 sprays into both nostrils daily. 16 g 1  . furosemide (LASIX) 20 MG tablet Take 1 tablet (20 mg total) by mouth daily as needed. 30 tablet 1  . HYDROcodone-homatropine (HYCODAN) 5-1.5 MG/5ML syrup Take 5 mLs by mouth every 8 (eight) hours as needed for cough. 120 mL 0  . ibuprofen (ADVIL,MOTRIN) 200 MG tablet Take 200 mg by mouth every 6 (six) hours as needed for mild pain or moderate pain.    . mometasone (ASMANEX 60 METERED DOSES) 220 MCG/INH inhaler USE 2 INHALATIONS ORALLY EVERY EVENING 3 Inhaler 1  . montelukast (SINGULAIR) 10 MG tablet TAKE 1 TABLET BY MOUTH once daily 90 tablet 1  . nitroGLYCERIN (NITROSTAT) 0.4 MG SL tablet Place 1 tablet (0.4 mg total) under the tongue every 5 (five) minutes as needed for chest pain. 25 tablet 0  . Omeprazole-Sodium Bicarbonate (ZEGERID OTC PO) Take 1 tablet by mouth daily.    Marland Kitchen tiotropium (SPIRIVA HANDIHALER) 18 MCG inhalation capsule INHALE THE CONTENTS OF ONE CAPSULE DAILY 90 capsule 1  . venlafaxine XR (EFFEXOR-XR) 150 MG 24 hr capsule TAKE 1 CAPSULE BY MOUTH DAILY WITH BREAKFAST 90 capsule 1  . ipratropium-albuterol (DUONEB) 0.5-2.5 (3) MG/3ML SOLN Inhale 3 mLs into the lungs 4 (four) times daily as needed. (Patient taking differently: Inhale 3 mLs into the lungs 4 (four) times daily as needed (wheezing or shortness of breath). ) 360 mL 5  . metoCLOPramide (REGLAN) 10 MG tablet Take 1 tablet (10 mg total) by mouth 4 (four) times daily -  before meals and at bedtime. (Patient not taking: Reported on 11/24/2015) 90 tablet 2  . predniSONE (DELTASONE) 10 MG tablet 40mg X2 days, 30mg  X2 days, 20mg  X2 days, 10mg X2 days, then stop. 20 tablet 0  . venlafaxine XR (EFFEXOR-XR) 150 MG 24 hr capsule Take 1 capsule (150 mg total) by mouth daily with breakfast. 90 capsule 1   No facility-administered medications prior to visit.    Allergies  Allergen Reactions  . Beclomethasone Dipropionate Hives and Other (See Comments)      weight gain  . Budesonide-Formoterol Fumarate Hives  . Mometasone Furo-Formoterol Fum Hives and Other (See Comments)    weight gain  . Sulfonamide Derivatives Hives and Rash  . Statins     Myalgias, RLS    Review of Systems  Constitutional: Negative for fever and malaise/fatigue.  HENT: Negative for congestion.  Eyes: Negative for blurred vision.  Respiratory: Positive for cough and shortness of breath.   Cardiovascular: Negative for chest pain, palpitations and leg swelling.  Gastrointestinal: Negative for nausea, abdominal pain and blood in stool.  Genitourinary: Negative for dysuria and frequency.  Musculoskeletal: Negative for falls.  Skin: Negative for rash.  Neurological: Negative for dizziness, loss of consciousness and headaches.  Endo/Heme/Allergies: Negative for environmental allergies.  Psychiatric/Behavioral: Negative for depression. The patient is not nervous/anxious.        Objective:    Physical Exam  Constitutional: She is oriented to person, place, and time. She appears well-developed and well-nourished. No distress.  HENT:  Head: Normocephalic and atraumatic.  Nose: Nose normal.  Eyes: Right eye exhibits no discharge. Left eye exhibits no discharge.  Neck: Normal range of motion. Neck supple.  Cardiovascular: Normal rate and regular rhythm.   No murmur heard. Pulmonary/Chest: Effort normal and breath sounds normal.  Abdominal: Soft. Bowel sounds are normal. There is no tenderness.  Musculoskeletal: She exhibits no edema.  Neurological: She is alert and oriented to person, place, and time.  Skin: Skin is warm and dry.  Psychiatric: She has a normal mood and affect.  Nursing note and vitals reviewed.   BP 120/82 mmHg  Pulse 93  Temp(Src) 98.7 F (37.1 C) (Oral)  Ht 5\' 2"  (1.575 m)  Wt 167 lb (75.751 kg)  BMI 30.54 kg/m2  SpO2 98% Wt Readings from Last 3 Encounters:  11/24/15 167 lb 9.6 oz (76.023 kg)  11/20/15 167 lb (75.751 kg)  09/28/15 168  lb (76.204 kg)     Lab Results  Component Value Date   WBC 7.6 11/20/2015   HGB 13.4 11/20/2015   HCT 40.6 11/20/2015   PLT 352.0 11/20/2015   GLUCOSE 84 11/20/2015   CHOL 259* 11/20/2015   TRIG 125.0 11/20/2015   HDL 52.10 11/20/2015   LDLDIRECT 177.8 09/07/2011   LDLCALC 182* 11/20/2015   ALT 16 11/20/2015   AST 18 11/20/2015   NA 140 11/20/2015   K 3.9 11/20/2015   CL 102 11/20/2015   CREATININE 0.71 11/20/2015   BUN 14 11/20/2015   CO2 28 11/20/2015   TSH 1.78 11/20/2015   INR 1.0 11/19/2014   HGBA1C 5.5 11/20/2015    Lab Results  Component Value Date   TSH 1.78 11/20/2015   Lab Results  Component Value Date   WBC 7.6 11/20/2015   HGB 13.4 11/20/2015   HCT 40.6 11/20/2015   MCV 89.7 11/20/2015   PLT 352.0 11/20/2015   Lab Results  Component Value Date   NA 140 11/20/2015   K 3.9 11/20/2015   CO2 28 11/20/2015   GLUCOSE 84 11/20/2015   BUN 14 11/20/2015   CREATININE 0.71 11/20/2015   BILITOT 0.5 11/20/2015   ALKPHOS 92 11/20/2015   AST 18 11/20/2015   ALT 16 11/20/2015   PROT 7.2 11/20/2015   ALBUMIN 4.4 11/20/2015   CALCIUM 9.8 11/20/2015   ANIONGAP 7 11/21/2014   GFR 89.70 11/20/2015   Lab Results  Component Value Date   CHOL 259* 11/20/2015   Lab Results  Component Value Date   HDL 52.10 11/20/2015   Lab Results  Component Value Date   LDLCALC 182* 11/20/2015   Lab Results  Component Value Date   TRIG 125.0 11/20/2015   Lab Results  Component Value Date   CHOLHDL 5 11/20/2015   Lab Results  Component Value Date   HGBA1C 5.5 11/20/2015  Assessment & Plan:   Problem List Items Addressed This Visit    Obesity - Primary    Encouraged DASH diet, decrease po intake and increase exercise as tolerated. Needs 7-8 hours of sleep nightly. Avoid trans fats, eat small, frequent meals every 4-5 hours with lean proteins, complex carbs and healthy fats. Minimize simple carbs.      Relevant Orders   TSH (Completed)   CBC  (Completed)   Comprehensive metabolic panel (Completed)   Lipid panel (Completed)   Hemoglobin A1c (Completed)   Hyperlipidemia    Encouraged heart healthy diet, increase exercise, avoid trans fats, consider a krill oil cap daily      Relevant Orders   TSH (Completed)   CBC (Completed)   Comprehensive metabolic panel (Completed)   Lipid panel (Completed)   Hemoglobin A1c (Completed)   Hyperglycemia    hgba1c acceptable, minimize simple carbs. Increase exercise as tolerated.       Relevant Orders   TSH (Completed)   CBC (Completed)   Comprehensive metabolic panel (Completed)   Lipid panel (Completed)   Hemoglobin A1c (Completed)   Esophageal reflux    Avoid offending foods, start probiotics. Do not eat large meals in late evening and consider raising head of bed.       Relevant Orders   TSH (Completed)   CBC (Completed)   Comprehensive metabolic panel (Completed)   Lipid panel (Completed)   Hemoglobin A1c (Completed)   ELEVATED BP READING WITHOUT DX HYPERTENSION    Well controlled. Encouraged heart healthy diet such as the DASH diet and exercise as tolerated.       Relevant Orders   TSH (Completed)   CBC (Completed)   Comprehensive metabolic panel (Completed)   Lipid panel (Completed)   Hemoglobin A1c (Completed)   Disorder of bone and cartilage    Encouraged to get adequate exercise, calcium and vitamin d intake      Asthma    Notes ongoing cough at night. Nonproductive. Is switching from Asmanex to Pulmicort if her cough persists after that she will follow up with pulmonology         I have discontinued Ms. Skilling's predniSONE. I am also having her maintain her Vitamin D, aspirin, fexofenadine, furosemide, ibuprofen, nitroGLYCERIN, Coenzyme Q10 (CO Q 10 PO), Omeprazole-Sodium Bicarbonate (ZEGERID OTC PO), mometasone, budesonide, HYDROcodone-homatropine, fluticasone, tiotropium, montelukast, ALPRAZolam, and venlafaxine XR.  No orders of the defined types were  placed in this encounter.     [name], MD

## 2015-11-29 NOTE — Assessment & Plan Note (Signed)
Well controlled. Encouraged heart healthy diet such as the DASH diet and exercise as tolerated.  

## 2015-11-29 NOTE — Assessment & Plan Note (Signed)
hgba1c acceptable, minimize simple carbs. Increase exercise as tolerated.  

## 2015-11-29 NOTE — Assessment & Plan Note (Signed)
Encouraged to get adequate exercise, calcium and vitamin d intake 

## 2015-11-30 ENCOUNTER — Ambulatory Visit: Payer: PPO | Admitting: Family Medicine

## 2015-12-03 ENCOUNTER — Ambulatory Visit: Payer: PPO | Admitting: Family Medicine

## 2015-12-07 ENCOUNTER — Encounter: Payer: Self-pay | Admitting: Family Medicine

## 2015-12-07 ENCOUNTER — Encounter: Payer: Self-pay | Admitting: Internal Medicine

## 2015-12-29 ENCOUNTER — Telehealth: Payer: Self-pay | Admitting: Internal Medicine

## 2015-12-29 NOTE — Telephone Encounter (Signed)
lmtcb X1 for pt  

## 2015-12-30 NOTE — Telephone Encounter (Signed)
MR- pt's nucala grant will only cover 2 injections-she then can try to reapply for more grant money.  Ok to initiate nucala therapy, despite uncertain monetary coverage?  Thanks!

## 2015-12-30 NOTE — Telephone Encounter (Signed)
Pt returning call from yesterday and this morning.  [phone]

## 2015-12-30 NOTE — Telephone Encounter (Signed)
LMOMTCB x1 for pt Need to find out of pt was able to get any more grant money for her nucala.

## 2015-12-30 NOTE — Telephone Encounter (Signed)
Pt returned that she got a 1400 grant which would only cover 2 treatments then she would have to re-apply and see if she could get more money then said that  Victoria [name] was going to talk w/MR about this. Pt said if you call and get no answer to send her a msg through [employer].[name]

## 2015-12-30 NOTE — Telephone Encounter (Signed)
lmcb x2 for pt. 

## 2015-12-31 NOTE — Telephone Encounter (Signed)
Spoke with pt. She is aware that MR wants to proceed with the 2 injections that will be covered. Will route back to [place] to follow up on.

## 2015-12-31 NOTE — Telephone Encounter (Signed)
Ok to try 2 injections of nucala and that wil lbe 8 weeks and during that time can try to get more grant. See if Clayborne Dana can try something else

## 2016-01-01 ENCOUNTER — Ambulatory Visit: Payer: PPO | Admitting: Family Medicine

## 2016-01-07 ENCOUNTER — Encounter: Payer: Self-pay | Admitting: Internal Medicine

## 2016-01-07 NOTE — Telephone Encounter (Signed)
MR please advise. Thanks! 

## 2016-01-08 ENCOUNTER — Encounter: Payer: Self-pay | Admitting: Internal Medicine

## 2016-01-08 ENCOUNTER — Telehealth: Payer: Self-pay | Admitting: Internal Medicine

## 2016-01-08 MED ORDER — PREDNISONE 10 MG PO TABS
ORAL_TABLET | ORAL | Status: DC
Start: 2016-01-08 — End: 2016-02-02

## 2016-01-08 MED ORDER — LEVOFLOXACIN 500 MG PO TABS: 500.0000 mg | ORAL_TABLET | Freq: Every day | ORAL | Status: DC

## 2016-01-08 NOTE — Telephone Encounter (Signed)
email has been sent to the pt to make her aware of MR recs per her request earlier this morning, due to the pt unable to hardly talk.  meds have been sent to her pharmacy. Nothing further is needed.

## 2016-01-08 NOTE — Telephone Encounter (Signed)
Called spoke with pt. She denies any thrush. She is going to take the levaquin and prednisone sent to the pharmacy

## 2016-01-08 NOTE — Telephone Encounter (Signed)
Addressed in earlier phone message. Make sure she has no thrush.. Please ask her

## 2016-01-08 NOTE — Telephone Encounter (Signed)
MR pt stated in her email back that she is trying to stay away from levaquin, cipro and doxy and wanted to see if she could do the zpak instead.    She is not on the nucalla and she has been approved for the $1400 for the grant, but this will not be enough to cover her to continue on this medication per [place].  Please advise. thanks

## 2016-01-08 NOTE — Telephone Encounter (Signed)
Called and spoke with pt and she can hardly talk.  She has sent an email in this morning and this was sent to MR to address.  Pt stated that she feels that she needs antibiotic and prednisone.  She is congested and she stated that she is starting to wheeze more.  MR please advise. Thanks  We will need to send her an email once this is addressed by MR since the pt can hardly talk.

## 2016-01-08 NOTE — Telephone Encounter (Signed)
Victoria [name] please advise on progress

## 2016-01-08 NOTE — Telephone Encounter (Signed)
Allergies  Allergen Reactions  . Beclomethasone Dipropionate Hives and Other (See Comments)     weight gain  . Budesonide-Formoterol Fumarate Hives  . Mometasone Furo-Formoterol Fum Hives and Other (See Comments)    weight gain  . Sulfonamide Derivatives Hives and Rash  . Statins     Myalgias, RLS     Is she on nucala yet?   take levaquin 500mg  once daily  X 5 days   Please take Take prednisone 40mg  once daily x 3 days, then 30mg  once daily x 3 days, then 20mg  once daily x 3 days, then prednisone 10mg  once daily  x 3 days and stop or go to baseline dose; I believe she is off chronic steroids at this point  Dr. Brand Males, M.D., [facility].C.P Pulmonary and Critical Care Medicine Staff Physician [facility] Pager: [phone], If no answer or between  15:00h - 7:00h: call [phone]  01/08/2016 2:00 PM

## 2016-01-11 ENCOUNTER — Encounter: Payer: Self-pay | Admitting: *Deleted

## 2016-01-11 MED ORDER — AZITHROMYCIN 250 MG PO TABS
ORAL_TABLET | ORAL | 0 refills | Status: DC
Start: 2016-01-11 — End: 2016-02-02

## 2016-01-11 NOTE — Addendum Note (Signed)
Addended by: Osa Craver on: 01/11/2016 10:39 AM   Modules accepted: Orders

## 2016-01-11 NOTE — Telephone Encounter (Signed)
Pt returned call. Reviewed MR's recs and verified pharmacy as [facility]. She voiced understanding and had no further questions. Zpak sent, nothing further needed.

## 2016-01-11 NOTE — Telephone Encounter (Signed)
Ok for [employer] -= sorry this comes late but I Was off Friday pm  I d/w [name]: looks like we are stuck without Nucala

## 2016-01-11 NOTE — Telephone Encounter (Addendum)
Per Chantel, pt returned call @ 10:10am > [phone] LMOM TCB x1

## 2016-01-11 NOTE — Telephone Encounter (Signed)
LVM for pt to return call

## 2016-01-12 ENCOUNTER — Ambulatory Visit (INDEPENDENT_AMBULATORY_CARE_PROVIDER_SITE_OTHER): Payer: PPO | Admitting: Family Medicine

## 2016-01-12 ENCOUNTER — Encounter: Payer: Self-pay | Admitting: Family Medicine

## 2016-01-12 VITALS — BP 113/79 | HR 73 | Temp 99.0°F | Ht 62.25 in | Wt 164.9 lb

## 2016-01-12 DIAGNOSIS — F411 Generalized anxiety disorder: Secondary | ICD-10-CM

## 2016-01-12 DIAGNOSIS — Z22322 Carrier or suspected carrier of Methicillin resistant Staphylococcus aureus: Secondary | ICD-10-CM | POA: Insufficient documentation

## 2016-01-12 DIAGNOSIS — G479 Sleep disorder, unspecified: Secondary | ICD-10-CM

## 2016-01-12 DIAGNOSIS — F329 Major depressive disorder, single episode, unspecified: Secondary | ICD-10-CM

## 2016-01-12 DIAGNOSIS — E785 Hyperlipidemia, unspecified: Secondary | ICD-10-CM | POA: Diagnosis not present

## 2016-01-12 DIAGNOSIS — F32A Depression, unspecified: Secondary | ICD-10-CM

## 2016-01-12 DIAGNOSIS — E663 Overweight: Secondary | ICD-10-CM

## 2016-01-12 MED ORDER — MELATONIN 1 MG SL SUBL: SUBLINGUAL_TABLET | SUBLINGUAL | 0 refills | Status: DC

## 2016-01-12 NOTE — Patient Instructions (Addendum)
Take the melatonin nightly- SL.  Try to walk daily- even 5 min.       Top Ten Foods for Health  1. Water Drink at least 8 to 12 cups of water daily. Consume half of your body weight in pounds, is the amount of water in ounces to drink daily.  Ie: a 200lb person = 100 oz water daily  2. Dark Green Vegetables Eat dark green vegetables at least three to four times a week. Good options include broccoli, peppers, brussel sprouts and leafy greens like kale and spinach.  3. Whole Grains Whole grains should be included in your diet at least two to three times daily. Look for whole wheat flour, rye, oatmeal, barley, amaranth, quinoa or a multigrain. A good source of fiber includes 3 to 4 grams of fiber per serving. A great source has 5 or more grams of fiber per serving.  4. Beans and Lentils Try to eat a bean-based meal at least once a week. Try to add legumes, including beans and lentils, to soups, stews, casseroles, salads and dips or eat them plain.  5. Fish Try to eat two to three serving of fish a week. A serving consists of 3 to 4 ounces of cooked fish. Good choices are salmon, trout, herring, bluefish, sardines and tuna.  6. Berries Include two to four servings of fruit in your diet each day. Try to eat berries such as raspberries, blueberries, blackberries and strawberries.  7. Winter Squash Eat butternut and acorn squash as well as other richly pigmented dark orange and green colored vegetables like sweet potato, cantaloupe and mango.  8. Soy 25 grams of soy protein a day is recommended as part of a low-fat diet to help lower cholesterol levels. Try tofu, soymilk, edamame soybeans, tempeh and texturized vegetable protein (TVP).  9. Flaxseed, Nuts and Seeds Add 1 to 2 tablespoons of ground flaxseed or other seeds to food each day or include a moderate amount of nuts - 1/4 cup - in your daily diet.  10. Organic Yogurt Men and women between 26 and 30 years of age need 1000  milligrams of calcium a day and 1200 milligrams if 50 or older. Eat calcium-rich foods such as nonfat or low-fat dairy products three to four times a day. Include organic choices.

## 2016-01-12 NOTE — Assessment & Plan Note (Signed)
Dr Onnie Graham- [facility], [facility], appt in 2 wks  Considering Nucala  freq exaccerbations- 7 times this year alone prednisone tapers 12 d- 40mg  *3d, etc

## 2016-01-12 NOTE — Progress Notes (Signed)
New patient office visit note:  Impression and Recommendations:    1. Depression   2. Anxiety state   3. Sleep disorder due to stress   4. HLD (hyperlipidemia)   5. Overweight (BMI 25.0-29.9)   6. MRSA (methicillin resistant Staphylococcus aureus) colonization      Continue your Effexor, only use benzodiazepine when necessary.  Try melatonin, increasing dose for sleep. 1 mg sublingual recommended  Need to try to move more, even if it's 5 minutes twice daily.   Prudent diet of low saturated/ trans-fat and low carb  Mupirocin ointment patient artery has she applies to her nose twice daily as given to her by the dermatologist. Continue decolonization protocol per dermatologist.  Patient's Medications  New Prescriptions   MELATONIN 1 MG SUBL    Take it nightly  Previous Medications   ALPRAZOLAM (XANAX) 0.5 MG TABLET    Take 1 tablet (0.5 mg total) by mouth 2 (two) times daily as needed for anxiety (Palpatations, SOB).   ASPIRIN 81 MG TABLET    Take 81 mg by mouth daily.    AZITHROMYCIN (ZITHROMAX) 250 MG TABLET    Take 2 tablets by mouth today, then take 1 tablet daily until gone   BUDESONIDE (PULMICORT FLEXHALER) 180 MCG/ACT INHALER    Inhale 2 puffs into the lungs 2 (two) times daily.   CHOLECALCIFEROL (VITAMIN D) 2000 UNITS CAPS    Take 2,000 Units by mouth daily.    COENZYME Q10 (CO Q 10 PO)    Take 200 mg by mouth 2 (two) times daily.   FEXOFENADINE (ALLEGRA) 180 MG TABLET    Take 180 mg by mouth daily as needed for allergies.    FLUTICASONE (FLONASE) 50 MCG/ACT NASAL SPRAY    Place 2 sprays into both nostrils daily.   FUROSEMIDE (LASIX) 20 MG TABLET    Take 1 tablet (20 mg total) by mouth daily as needed.   HYDROCODONE-HOMATROPINE (HYCODAN) 5-1.5 MG/5ML SYRUP    Take 5 mLs by mouth every 8 (eight) hours as needed for cough.   IBUPROFEN (ADVIL,MOTRIN) 200 MG TABLET    Take 200 mg by mouth every 6 (six) hours as needed for mild pain or moderate pain.   IPRATROPIUM-ALBUTEROL (DUONEB) 0.5-2.5 (3) MG/3ML SOLN    INHALE THE CONTENTS OF 1 VIAL VIA NEBULIZER UP TO 4 TO 6 TIMES A DAY.   MONTELUKAST (SINGULAIR) 10 MG TABLET    TAKE 1 TABLET BY MOUTH once daily   NITROGLYCERIN (NITROSTAT) 0.4 MG SL TABLET    Place 1 tablet (0.4 mg total) under the tongue every 5 (five) minutes as needed for chest pain.   OMEGA-3 FAT AC-CHOLECALCIFEROL (OMEGA-3 + VITAMIN D3 ULTRA STR) LIQD    Take 1 Units by mouth daily. 1 teaspoon daily- 1600mg  omega 3   OMEPRAZOLE-SODIUM BICARBONATE (ZEGERID OTC PO)    Take 1 tablet by mouth daily.   OVER THE COUNTER MEDICATION    Take 1 capsule by mouth daily. Tumeric   OVER THE COUNTER MEDICATION    Take 2 capsules by mouth daily.   PREDNISONE (DELTASONE) 10 MG TABLET    Take 4 tabs x 3 days, 3 tabs x 3 days, 2 tabs x 3 days, 1 tab x 3 days then stop   PRENATAL VIT-FE FUMARATE-FA (M-VIT) TABLET    Take 1 tablet by mouth daily.   PROBIOTIC PRODUCT (PROBIOTIC-10 PO)    Take 1 tablet by mouth daily.   TIOTROPIUM (SPIRIVA HANDIHALER)  18 MCG INHALATION CAPSULE    INHALE THE CONTENTS OF ONE CAPSULE DAILY   VENLAFAXINE XR (EFFEXOR-XR) 150 MG 24 HR CAPSULE    TAKE 1 CAPSULE BY MOUTH DAILY WITH BREAKFAST  Modified Medications   No medications on file  Discontinued Medications   DOXYCYCLINE (VIBRA-TABS) 100 MG TABLET    Take 1 tablet (100 mg total) by mouth 2 (two) times daily. Take for 10 days   LEVOFLOXACIN (LEVAQUIN) 500 MG TABLET    Take 1 tablet (500 mg total) by mouth daily.   MOMETASONE (ASMANEX 60 METERED DOSES) 220 MCG/INH INHALER    USE 2 INHALATIONS ORALLY EVERY EVENING    Return in about 6 months (around 07/14/2016) for Follow-up of current medical issues, or sosoner if needed.  The patient was counseled, risk factors were discussed, anticipatory guidance given.  Gross side effects, risk and benefits, and alternatives of medications discussed with patient.  Patient is aware that all medications have potential side effects and we  are unable to predict every side effect or drug-drug interaction that may occur.  Expresses verbal understanding and consents to current therapy plan and treatment regimen.  Please see AVS handed out to patient at the end of our visit for further patient instructions/ counseling done pertaining to today's office visit.    Note: This document was prepared using Dragon voice recognition software and may include unintentional dictation errors.  ----------------------------------------------------------------------------------------------------------------------    Subjective:    Chief Complaint  Patient presents with  . Establish Care    HPI: Victoria [name] is a pleasant 60 y.o. female who presents to [place] at [facility] today for the first time to review her medical history with me and establish care.  She has quite an extensive past medical history.   Recently seen for bronchitis and acute exacerbation of her chronic pulmonary condition. Patient is just finishing up a Z-Pak and prednisone taper dose.  Treated at outside clinic. Patient is feeling better than prior.  PCP- Dr Charlett Blake- [facility].   Preventative health maintenance stuff and also she would treat patient's depression\anxiety.   There is really no other medicines that patient would get from her PCP on a regular basis.     Patient has a lot of emotional stressors.  She has had pulmonary and breathing issues all her life and rarely feels well. In addition she takes care of her disabled son 24 7. She does have a helper who can stay with him some nights and during the day but essentially he is on a percent her responsibility.   xanax ( takes 1/2 when she can't sleep with taking prednisone)  and effexor- been on for 16 yrs.  this helps a time and makes it so that she is not overly emotional.    Been on Xanax- couple yrs- on ave takes it 3 n/wk when on pred.   On 548mcg melatonin- only took occasionally.  Says it worked  well but patient really didn't try it much.        Dr Onnie Graham- [facility], appt in 2 wks,  sees regularly.  Patient tells me she only has about 30% of her lung function left. Allergic bronchopulmonary aspergillosis and Eosonophilic asthma.  5-10 exacerbations per year on average.    Dermatologist- Dr Rosey Bath- yrly skin screening and txing MRSA skin infections; caught from her son [name].   MRSA infxn- 3 times with skin pustules/boils since jan  [facility] -  Has a "bad hiatal hernia" and GERD.   Freq vomiting due to hiatal hernia in the past- maybe once monthly now.   [facility] Sow - GYN, Mammo's yrly-  Mom dx age 28, 53- mets, 59-died;  N PAP's;   Dexa at age 32- hip osteoporosis- txed by GYN.   Dexa- q yrly,   Rozelle Logan combo Ca-vitD  Cardiology-  Ena Dawley-  For chol mgt and non-sp CP's;  Heart cath- June '16- ess N.  Wants pt to go on injecitons for her chol.   patient has not been able to tolerate statins in the past. She gets severe myalgias and nerve pain to the tops of her feet. She states that she had tried Zetia and others like And they had no impact on her cholesterol were no help at all.   She said she takes a herbal for cholesterol GUGGUL.     --- Very little activity and no regular exercise due to her pulmonary status and poor exercise tolerance. She does have a pool and occasionally swims      Patient Active Problem List   Diagnosis Date Noted  . Overweight (BMI 25.0-29.9) 01/12/2016    Priority: High  . Hyperglycemia 11/20/2015    Priority: High  . Anxiety state 08/18/2013    Priority: High  . HLD (hyperlipidemia) 09/11/2007    Priority: High  . Depression 12/11/2006    Priority: High  . MRSA (methicillin resistant Staphylococcus aureus) colonization 01/12/2016    Priority: Medium  . Chronic diastolic CHF (congestive heart failure), NYHA class 2 ([place]) 06/26/2015    Priority: Medium  . Seasonal and perennial allergic rhinitis  10/22/2011    Priority: Medium  . OSA (obstructive sleep apnea) 11/13/2010    Priority: Medium  . Environmental allergies 11/13/2010    Priority: Medium  . DIVERTICULOSIS, COLON 06/11/2010    Priority: Medium  . ELEVATED BP READING WITHOUT DX HYPERTENSION 08/03/2009    Priority: Medium  . STRESS INCONTINENCE 09/11/2007    Priority: Medium  . Osteoarthritis 12/11/2006    Priority: Medium  . Disorder of bone and cartilage 12/11/2006    Priority: Medium  . Sleep disorder due to stress 01/12/2016  . Coronary artery disease due to lipid rich plaque 06/26/2015  . 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[place]) [id number]  . Cardiomegaly 11/21/2014  . Fatigue 09/01/2011  . Chest pain, atypical 06/01/2011  . UMBILICAL HERNIA [id number]  . HEPATIC CYST 06/11/2010  . SCOLIOSIS 06/11/2010  . TRANSAMINASES, SERUM, ELEVATED 05/27/2010  . Esophageal reflux 10/21/2008  . h/o PALPITATIONS 08/19/2008  . CHONDROMALACIA OF PATELLA 04/15/2008  . A B P A-ALLERGIC BRONCHOPULMONARY ASPERGILLOSIS 12/14/2007  . Asthma 11/16/2007  . Cushing's syndrome ([place]) 12/11/2006     Past Medical History:  Diagnosis Date  . Allergic bronchopulmonary aspergillosis ([place]) 2008   sees Dr Edmund Hilda pulmonology  . Anemia    iron deficiency, resolved  . Anxiety   . Asthma   . CAD (coronary artery disease)    a. LHC 6/16:  oOM1 60, pRCA 25 >> med Rx  . COPD (chronic obstructive pulmonary disease) ([place])   . Depression    mild  . Diverticulosis   . GERD (gastroesophageal reflux disease)   . H/O hiatal hernia   . History of echocardiogram    Echo 6/16:  Mod LVH, EF 60-65%, no RWMA, Gr 1 DD, trivial MR, normal LA size.  Marland Kitchen Hyperglycemia 11/20/2015  . Hyperlipidemia   . IBS (irritable bowel syndrome)   . Maxillary sinusitis   .  Normal cardiac stress test 11/2011   No evidence of ischemia or infarct. Calculated ejection fraction 72%.  . Obesity   . OSA (obstructive sleep apnea) 02/2012   has stopped using  cpap  .  Osteoarthritis   . Osteoporosis   . Pneumonia 11/2011   "before 2013 I hadn't had pneumonia since I was a child" (04/13/2012)  . Pulmonary nodules   . Schatzki's ring      Past Surgical History:  Procedure Laterality Date  . APPENDECTOMY  1989  . CARDIAC CATHETERIZATION N/A 11/25/2014   Procedure: Right/Left Heart Cath and Coronary Angiography;  Surgeon: Belva Crome, MD;  Location: [facility];  Service: Cardiovascular;  Laterality: N/A;  . CESAREAN SECTION  1985  . HERNIA REPAIR  04/13/2012   VHR laparoscopic  . VENTRAL HERNIA REPAIR  04/13/2012   Procedure: LAPAROSCOPIC VENTRAL HERNIA;  Surgeon: Adin Hector, MD;  Location: [place];  Service: General;  Laterality: N/A;  laparoscopic repair of incarcerated hernia     Family History  Problem Relation Age of Onset  . Breast cancer Mother   . Hypertension Mother   . Diabetes Mother   . Diverticulosis Father   . Prostate cancer Father     prostate  . Pulmonary embolism Brother     recurrent  . Heart attack Maternal Grandfather   . Breast cancer Sister     breast  . Stroke Neg Hx      History  Drug Use No    History  Alcohol Use  . 2.4 oz/week  . 4 Glasses of wine per week    Comment: 04/13/2012 "beer in the summer; wine 3-4 times/wk"    History  Smoking Status  . Never Smoker  Smokeless Tobacco  . Never Used    Patient's Medications  New Prescriptions   MELATONIN 1 MG SUBL    Take it nightly  Previous Medications   ALPRAZOLAM (XANAX) 0.5 MG TABLET    Take 1 tablet (0.5 mg total) by mouth 2 (two) times daily as needed for anxiety (Palpatations, SOB).   ASPIRIN 81 MG TABLET    Take 81 mg by mouth daily.    AZITHROMYCIN (ZITHROMAX) 250 MG TABLET    Take 2 tablets by mouth today, then take 1 tablet daily until gone   BUDESONIDE (PULMICORT FLEXHALER) 180 MCG/ACT INHALER    Inhale 2 puffs into the lungs 2 (two) times daily.   CHOLECALCIFEROL (VITAMIN D) 2000 UNITS CAPS    Take 2,000 Units by mouth  daily.    COENZYME Q10 (CO Q 10 PO)    Take 200 mg by mouth 2 (two) times daily.   FEXOFENADINE (ALLEGRA) 180 MG TABLET    Take 180 mg by mouth daily as needed for allergies.    FLUTICASONE (FLONASE) 50 MCG/ACT NASAL SPRAY    Place 2 sprays into both nostrils daily.   FUROSEMIDE (LASIX) 20 MG TABLET    Take 1 tablet (20 mg total) by mouth daily as needed.   HYDROCODONE-HOMATROPINE (HYCODAN) 5-1.5 MG/5ML SYRUP    Take 5 mLs by mouth every 8 (eight) hours as needed for cough.   IBUPROFEN (ADVIL,MOTRIN) 200 MG TABLET    Take 200 mg by mouth every 6 (six) hours as needed for mild pain or moderate pain.   IPRATROPIUM-ALBUTEROL (DUONEB) 0.5-2.5 (3) MG/3ML SOLN    INHALE THE CONTENTS OF 1 VIAL VIA NEBULIZER UP TO 4 TO 6 TIMES A DAY.   MONTELUKAST (SINGULAIR) 10 MG TABLET  TAKE 1 TABLET BY MOUTH once daily   NITROGLYCERIN (NITROSTAT) 0.4 MG SL TABLET    Place 1 tablet (0.4 mg total) under the tongue every 5 (five) minutes as needed for chest pain.   OMEGA-3 FAT AC-CHOLECALCIFEROL (OMEGA-3 + VITAMIN D3 ULTRA STR) LIQD    Take 1 Units by mouth daily. 1 teaspoon daily- 1600mg  omega 3   OMEPRAZOLE-SODIUM BICARBONATE (ZEGERID OTC PO)    Take 1 tablet by mouth daily.   OVER THE COUNTER MEDICATION    Take 1 capsule by mouth daily. Tumeric   OVER THE COUNTER MEDICATION    Take 2 capsules by mouth daily.   PREDNISONE (DELTASONE) 10 MG TABLET    Take 4 tabs x 3 days, 3 tabs x 3 days, 2 tabs x 3 days, 1 tab x 3 days then stop   PRENATAL VIT-FE FUMARATE-FA (M-VIT) TABLET    Take 1 tablet by mouth daily.   PROBIOTIC PRODUCT (PROBIOTIC-10 PO)    Take 1 tablet by mouth daily.   TIOTROPIUM (SPIRIVA HANDIHALER) 18 MCG INHALATION CAPSULE    INHALE THE CONTENTS OF ONE CAPSULE DAILY   VENLAFAXINE XR (EFFEXOR-XR) 150 MG 24 HR CAPSULE    TAKE 1 CAPSULE BY MOUTH DAILY WITH BREAKFAST  Modified Medications   No medications on file  Discontinued Medications   DOXYCYCLINE (VIBRA-TABS) 100 MG TABLET    Take 1 tablet (100 mg  total) by mouth 2 (two) times daily. Take for 10 days   LEVOFLOXACIN (LEVAQUIN) 500 MG TABLET    Take 1 tablet (500 mg total) by mouth daily.   MOMETASONE (ASMANEX 60 METERED DOSES) 220 MCG/INH INHALER    USE 2 INHALATIONS ORALLY EVERY EVENING    Allergies: Beclomethasone dipropionate; Budesonide-formoterol fumarate; Mometasone furo-formoterol fum; Sulfonamide derivatives; and Statins  Review of Systems:   ( Completed via Adult Medical History Intake form today ) General:  Denies fever, chills, appetite changes, unexplained weight loss.  Optho/Auditory:   Denies visual changes, blurred vision/LOV, ringing in ears/ diff hearing Respiratory:   Denies SOB, DOE, cough, wheezing.  Cardiovascular:   Denies chest pain, palpitations, new onset peripheral edema  Gastrointestinal:   Denies nausea, vomiting, diarrhea.  Genitourinary:    Denies dysuria, increased frequency, flank pain.  Endocrine:     Denies hot or cold intolerance, polyuria, polydipsia. Musculoskeletal:  Denies unexplained myalgias, joint swelling, arthralgias, gait problems.  Skin:  Denies rash, suspicious lesions or new/ changes in moles Neurological:    Denies dizziness, syncope, unexplained weakness, lightheadedness, numbness  Psychiatric/Behavioral:   Denies mood changes, suicidal or homicidal ideations, hallucinations    Objective:   Blood pressure 113/79, pulse 73, temperature 99 F (37.2 C), temperature source Oral, height 5' 2.25" (1.581 m), weight 164 lb 14.4 oz (74.8 kg), SpO2 99 %. Body mass index is 29.92 kg/m.   General: Well Developed, well nourished, and in no acute distress.  Neuro: Alert and oriented x3, extra-ocular muscles intact, sensation grossly intact.  HEENT: Normocephalic, atraumatic, pupils equal round reactive to light, neck supple, no carotid bruits, no JVD apprec Skin: no gross suspicious lesions or rashes  Cardiac: Regular rate and rhythm, no murmurs rubs or gallops.  Respiratory: Essentially  clear to auscultation bilaterally, no C/W/R; Not using accessory muscles, speaking in full sentences.  Abdominal: Soft, not grossly distended Musculoskeletal: Ambulates w/o diff, FROM * 4 ext.  Vasc: less 2 sec cap RF, warm and pink  Psych:  No HI/SI, judgement and insight good.

## 2016-01-13 NOTE — Telephone Encounter (Signed)
Called spoke with pt. She states that she would like to proceed with the medication. She states that the cost is not an issue at this time and that she feels that she needs to proceed due to her health issues at least for the next year to see if it is effective. I explained to her that I would make MR aware. She voiced understanding and had no further questions.  Will forward message to Joellen Jersey and MR

## 2016-01-13 NOTE — Telephone Encounter (Signed)
Please let patient know that she did not register wrong; [employer] is not giving out many funds at this time. I have spoken to MR about this and waiting for him to answer if he wants patient to start Nucala due to funding and cost of Nucala as a buy and bill to the office. If patient does not get extra funding after 2 months then she will be responsible for about $600 each month just for the medication alone.   Please have MR advise if he wants patient to truly start Nucala and then may have to stop due to funding. Thanks.

## 2016-01-13 NOTE — Telephone Encounter (Signed)
Please see if Clayborne Dana can answer this  Dr. Brand Males, M.D., [facility].C.P Pulmonary and Critical Care Medicine Staff Physician [facility] Pager: [phone], If no answer or between  15:00h - 7:00h: call [phone]  01/13/2016 4:41 PM

## 2016-01-13 NOTE — Telephone Encounter (Signed)
When I registered online at [facility]org, and when I called [employer] back, they told me my grant was indeed just $1400 for a diagnosis of asthma.  I was told when I use 85% of the grant I can reapply and see if there would be more money at that time.    According to what I was told, my thought was let's start, and then I will reapply again in the hopes I will get another $1,400 for another couple months and repeat the process of every 2 months.     So, did I register wrong?  Was I to have given PAN different information? Was I to have put down a different diagnosis?  I need your direction if I am to do anything different or someone specific at [employer].    I know Joellen Jersey told me this was unusual, that other patients got Nucala fully covered.  So if that is the case, I need to know what I need to do different to get what other patients have gotten for Nucala.    I have follow-up appt. on Aug. 15 with Dr. Onnie Graham.   Per pt from her email.  MR please advise. thanks

## 2016-01-14 ENCOUNTER — Encounter: Payer: Self-pay | Admitting: Internal Medicine

## 2016-01-14 NOTE — Telephone Encounter (Signed)
MR please advise. Thanks! 

## 2016-01-15 NOTE — Telephone Encounter (Signed)
[name]: please follow through on this.  IWant her on nucala

## 2016-01-19 NOTE — Telephone Encounter (Signed)
See email that pt sent back in.  Will close this phone note.

## 2016-01-26 ENCOUNTER — Telehealth: Payer: Self-pay | Admitting: *Deleted

## 2016-01-26 NOTE — Telephone Encounter (Signed)
Had received call last week from [employer] stating that they had received a call from pt stating that she thought she had a defective HandiHaler and that [facility] is sending her a replacement 30day supply.  Medication received today LMOM TCB x1 for pt to let her know

## 2016-01-27 ENCOUNTER — Telehealth: Payer: Self-pay | Admitting: Internal Medicine

## 2016-01-27 NOTE — Telephone Encounter (Signed)
Spoke with pt and she was concerned because she had not had a BM in several days which is not her normal. Pt did a couple of enemas and mag citrate and has had over 6 liquid stools since. Pt is eating and drinking ok, no N/V. Let pt know it does not sound like an obstruction as she has had several BM's and she is eating and drinking ok. Discussed with pt that she may want to add miralax for a couple of days and see if she gets back to her normal. Pt verbalized understanding.

## 2016-01-28 NOTE — Telephone Encounter (Signed)
Spoke with pt. She is scheduled to see MR Tuesday and reports she will get this then. Nothing further needed

## 2016-02-02 ENCOUNTER — Other Ambulatory Visit (INDEPENDENT_AMBULATORY_CARE_PROVIDER_SITE_OTHER): Payer: PPO

## 2016-02-02 ENCOUNTER — Ambulatory Visit (INDEPENDENT_AMBULATORY_CARE_PROVIDER_SITE_OTHER): Payer: PPO | Admitting: Internal Medicine

## 2016-02-02 ENCOUNTER — Encounter: Payer: Self-pay | Admitting: Internal Medicine

## 2016-02-02 VITALS — BP 118/74 | HR 74 | Ht 62.25 in | Wt 166.0 lb

## 2016-02-02 DIAGNOSIS — J8283 Eosinophilic asthma: Secondary | ICD-10-CM

## 2016-02-02 DIAGNOSIS — J4550 Severe persistent asthma, uncomplicated: Secondary | ICD-10-CM

## 2016-02-02 DIAGNOSIS — J82 Pulmonary eosinophilia, not elsewhere classified: Secondary | ICD-10-CM

## 2016-02-02 LAB — CBC WITH DIFFERENTIAL/PLATELET
BASOS ABS: 0 10*3/uL (ref 0.0–0.1)
BASOS PCT: 0.6 % (ref 0.0–3.0)
EOS ABS: 0.7 10*3/uL (ref 0.0–0.7)
Eosinophils Relative: 10.2 % — ABNORMAL HIGH (ref 0.0–5.0)
HEMATOCRIT: 38.7 % (ref 36.0–46.0)
HEMOGLOBIN: 13.1 g/dL (ref 12.0–15.0)
LYMPHS PCT: 25.7 % (ref 12.0–46.0)
Lymphs Abs: 1.7 10*3/uL (ref 0.7–4.0)
MCHC: 33.8 g/dL (ref 30.0–36.0)
MCV: 88.3 fl (ref 78.0–100.0)
Monocytes Absolute: 0.5 10*3/uL (ref 0.1–1.0)
Monocytes Relative: 7.9 % (ref 3.0–12.0)
NEUTROS PCT: 55.6 % (ref 43.0–77.0)
Neutro Abs: 3.6 10*3/uL (ref 1.4–7.7)
PLATELETS: 403 10*3/uL — AB (ref 150.0–400.0)
RBC: 4.38 Mil/uL (ref 3.87–5.11)
RDW: 13.5 % (ref 11.5–15.5)
WBC: 6.5 10*3/uL (ref 4.0–10.5)

## 2016-02-02 NOTE — Progress Notes (Signed)
Subjective:     Patient ID: Victoria [name], female   DOB: May 03, 1956, 60 y.o.   MRN: [id number]  HPI       OV 11/24/2015  Chief Complaint  Patient presents with  . Follow-up    Pt c/o chest tightness, wheezing, sob, nonprod coughing spells X1 week.  pt has been taking regular neb treatments.     Follow-up severe persistent asthma with significant eosinophilia and associated ABPA. She came off daily prednisone after many years November 2016 but since then has had multiple exacerbations requiring several courses of prednisone. Now for unclear reasons she tells me for the last 1 week she's had worsening asthma symptoms. Asthma control 5. questionnaire is 4.8 showing significant amount of symptoms. Exhaled nitric oxide is up at 120 which is more severe than her baseline elevated state of 80. All features fit in with an exacerbation. She is waking up a great many times at night with coughing and wheezing. She's sleeping in a recliner. When she wakes up she is quite severe symptoms. Activities are very limited because of asthma and she's experiencing a great deal of shortness of breath because of asthma and the past week she is wheezing most of the time and using albuterol rescue multiple times.  She is no longer on dual inhaler corticosteroid therapy and double dose Singulair  In the psat she had declined NUCALA due to fear of side effects and the need for shingles vaccine but at this point because of multiple exacerbations and after some thought and discussion with the husband she's decided to go ahead. Paperwork is being done. She is seeking medical assistance for co-pay and once as approved she will start this therapy.     Feno - 80 in Nov 2016 and Jan 2017 but 120 11/24/2015 and up  Lab review those 6?2!@7  shows normal creatinine and white count were noted as normal     OV 02/02/2016  Chief Complaint  Patient presents with  . Follow-up    Pt states she feels her SOB is at baseline. Pt  deneis cough, CP/tightness, f/c/s. Pt is not taking Nucala.    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  She is fed  with her quality of life with recurrent exacerbations and recurrent prednisone courses and having to deal with pushing by features and all the side effects of prednisone.last visit 11/24/2015 initiated paperwork for interleukin-5 receptor antibody subcutaneous injection Nucala ([place]). However she started running into financial problems with co-pay assistance. She was only able to cover 2 months of copay and she will have before the [place] for the remaining 10 months. Currently because of this practice is reluctant to stock enough supply of this subcutaneous injection. However patient tells me that her total copay will be $3700 for the year and this is something that she'll be easily able to afford. Therefore she sees no problem when for this injection. However in the interim I have learned that once monthly IV infusion therapy of interleukin-5 receptor antibody (Cinqair by [facility]) is now available at the [facility]  infusion center. therefore I had to  disclose this is an option for her treatment. . She weighed the 2 options and she wants to try the IV infusion therapy. We discussed the side effect profile and I gave her the side effects including black box warnings listed in [email or web address].in addition we discussed potential benefits. She feels well and her baseline. Her asthma control questionnaire is 0.3 out  of 5. Specifically she is not waking up at night because of asthma. When she wakes up she has no asthma symptoms is only very slightly limited because of asthma. She is a very little shortness of breath because of asthma and she wheezes hardly any of the time and is not using albuterol for rescue. Although she is dealing with sinus headaches. Despite feeling that asthma is very well controlled and not a steroids her  exhaled nitric oxide is elevated at 80 and she feels is likely to be her baselineshe also wants her differential count check. Last eosinophil count was elevated at 700 cells in end of 2016 per chart review    Asthma Control Panel 11/24/2015  02/02/2016   Current Med Regimen    ACQ 5 point- 1 week. wtd avg score. <1.0 is good control 0.75-1.25 is grey zone. >1.25 poor control. Delta 0.5 is clinically meaningful 4.8 0.3  ACQ 7 point - 1 week. wtd avg score. <1.0 is good control 0.75-1.25 is grey zone. >1.25 poor control. Delta 0.5 is clinically meaningful x   ACT - a [employer] test - 4 week. Total score. Max is 25, Lower score is worse.  <19 = poor control x   FeNO ppB 120 89 (feels well, likely baseline)  FeV1     Planned intervention  for visit pred burst + start nucala sart cinqair      has a past medical history of Allergic bronchopulmonary aspergillosis ([place]) (2008); Anemia; Anxiety; Asthma; CAD (coronary artery disease); COPD (chronic obstructive pulmonary disease) ([place]); Depression; Diverticulosis; GERD (gastroesophageal reflux disease); H/O hiatal hernia; History of echocardiogram; Hyperglycemia (11/20/2015); Hyperlipidemia; IBS (irritable bowel syndrome); Maxillary sinusitis; Normal cardiac stress test (11/2011); Obesity; OSA (obstructive sleep apnea) (02/2012); Osteoarthritis; Osteoporosis; Pneumonia (11/2011); Pulmonary nodules; and Schatzki's ring.   reports that she has never smoked. She has never used smokeless tobacco.  Past Surgical History:  Procedure Laterality Date  . APPENDECTOMY  1989  . CARDIAC CATHETERIZATION N/A 11/25/2014   Procedure: Right/Left Heart Cath and Coronary Angiography;  Surgeon: Belva Crome, MD;  Location: [facility];  Service: Cardiovascular;  Laterality: N/A;  . CESAREAN SECTION  1985  . HERNIA REPAIR  04/13/2012   VHR laparoscopic  . VENTRAL HERNIA REPAIR  04/13/2012   Procedure: LAPAROSCOPIC VENTRAL HERNIA;  Surgeon: Adin Hector, MD;  Location: [facility];  Service: General;  Laterality: N/A;  laparoscopic repair of incarcerated hernia    Allergies  Allergen Reactions  . Beclomethasone Dipropionate Hives and Other (See Comments)     weight gain  . Budesonide-Formoterol Fumarate Hives  . Mometasone Furo-Formoterol Fum Hives and Other (See Comments)    weight gain  . Sulfonamide Derivatives Hives and Rash  . Statins     Myalgias, RLS    Immunization History  Administered Date(s) Administered  . Influenza Split 04/20/2011  . Influenza Whole 06/06/2007, 04/15/2008, 04/02/2009, 03/29/2012  . Influenza, High Dose Seasonal PF 05/05/2015  . Influenza,inj,Quad PF,36+ Mos 05/09/2013, 03/03/2014  . Pneumococcal Conjugate-13 05/09/2013  . Pneumococcal Polysaccharide-23 05/04/2005  . Td 07/29/2009  . Tdap 03/11/2015    Family History  Problem Relation Age of Onset  . Breast cancer Mother   . Hypertension Mother   . Diabetes Mother   . Diverticulosis Father   . Prostate cancer Father     prostate  . Pulmonary embolism Brother     recurrent  . Heart attack Maternal Grandfather   . Breast cancer Sister  breast  . Stroke Neg Hx      Current Outpatient Prescriptions:  .  ALPRAZolam (XANAX) 0.5 MG tablet, Take 1 tablet (0.5 mg total) by mouth 2 (two) times daily as needed for anxiety (Palpatations, SOB)., Disp: 30 tablet, Rfl: 1 .  aspirin 81 MG tablet, Take 81 mg by mouth daily. , Disp: , Rfl:  .  budesonide (PULMICORT FLEXHALER) 180 MCG/ACT inhaler, Inhale 2 puffs into the lungs 2 (two) times daily., Disp: 3 Inhaler, Rfl: 3 .  Cholecalciferol (VITAMIN D) 2000 UNITS CAPS, Take 2,000 Units by mouth daily. , Disp: , Rfl:  .  Coenzyme Q10 (CO Q 10 PO), Take 200 mg by mouth 2 (two) times daily., Disp: , Rfl:  .  fexofenadine (ALLEGRA) 180 MG tablet, Take 180 mg by mouth daily as needed for allergies. , Disp: , Rfl:  .  fluticasone (FLONASE) 50 MCG/ACT nasal spray, Place 2 sprays into both nostrils daily., Disp: 16 g, Rfl: 1 .   furosemide (LASIX) 20 MG tablet, Take 1 tablet (20 mg total) by mouth daily as needed., Disp: 30 tablet, Rfl: 1 .  HYDROcodone-homatropine (HYCODAN) 5-1.5 MG/5ML syrup, Take 5 mLs by mouth every 8 (eight) hours as needed for cough., Disp: 120 mL, Rfl: 0 .  ibuprofen (ADVIL,MOTRIN) 200 MG tablet, Take 200 mg by mouth every 6 (six) hours as needed for mild pain or moderate pain., Disp: , Rfl:  .  ipratropium-albuterol (DUONEB) 0.5-2.5 (3) MG/3ML SOLN, INHALE THE CONTENTS OF 1 VIAL VIA NEBULIZER UP TO 4 TO 6 TIMES A DAY., Disp: 360 mL, Rfl: 2 .  Melatonin 1 MG SUBL, Take it nightly, Disp: 30 tablet, Rfl: 0 .  montelukast (SINGULAIR) 10 MG tablet, TAKE 1 TABLET BY MOUTH once daily, Disp: 90 tablet, Rfl: 1 .  mupirocin nasal ointment (BACTROBAN) 2 %, Place 1 application into the nose 2 (two) times daily. Use one-half of tube in each nostril twice daily for five (5) days. After application, press sides of nose together and gently massage., Disp: , Rfl:  .  nitroGLYCERIN (NITROSTAT) 0.4 MG SL tablet, Place 1 tablet (0.4 mg total) under the tongue every 5 (five) minutes as needed for chest pain., Disp: 25 tablet, Rfl: 0 .  Omega-3 Fat Ac-Cholecalciferol (OMEGA-3 + VITAMIN D3 ULTRA STR) LIQD, Take 1 Units by mouth daily. 1 teaspoon daily- 1600mg  omega 3, Disp: , Rfl:  .  Omeprazole-Sodium Bicarbonate (ZEGERID OTC PO), Take 1 tablet by mouth daily., Disp: , Rfl:  .  OVER THE COUNTER MEDICATION, Take 1 capsule by mouth daily. Tumeric, Disp: , Rfl:  .  Prenatal Vit-Fe Fumarate-FA (M-VIT) tablet, Take 1 tablet by mouth daily., Disp: , Rfl:  .  Probiotic Product (PROBIOTIC-10 PO), Take 1 tablet by mouth daily., Disp: , Rfl:  .  tiotropium (SPIRIVA HANDIHALER) 18 MCG inhalation capsule, INHALE THE CONTENTS OF ONE CAPSULE DAILY, Disp: 90 capsule, Rfl: 1 .  venlafaxine XR (EFFEXOR-XR) 150 MG 24 hr capsule, TAKE 1 CAPSULE BY MOUTH DAILY WITH BREAKFAST, Disp: 90 capsule, Rfl: 1    Review of Systems     Objective:    Physical Exam  Constitutional: She is oriented to person, place, and time. She appears well-developed and well-nourished. No distress.  HENT:  Head: Normocephalic and atraumatic.  Right Ear: External ear normal.  Left Ear: External ear normal.  Mouth/Throat: Oropharynx is clear and moist. No oropharyngeal exudate.  Eyes: Conjunctivae and EOM are normal. Pupils are equal, round, and reactive to light. Right eye  exhibits no discharge. Left eye exhibits no discharge. No scleral icterus.  Neck: Normal range of motion. Neck supple. No JVD present. No tracheal deviation present. No thyromegaly present.  Cardiovascular: Normal rate, regular rhythm, normal heart sounds and intact distal pulses.  Exam reveals no gallop and no friction rub.   No murmur heard. Pulmonary/Chest: Effort normal and breath sounds normal. No respiratory distress. She has no wheezes. She has no rales. She exhibits no tenderness.  Abdominal: Soft. Bowel sounds are normal. She exhibits no distension and no mass. There is no tenderness. There is no rebound and no guarding.  Musculoskeletal: Normal range of motion. She exhibits no edema or tenderness.  Lymphadenopathy:    She has no cervical adenopathy.  Neurological: She is alert and oriented to person, place, and time. She has normal reflexes. No cranial nerve deficit. She exhibits normal muscle tone. Coordination normal.  Skin: Skin is warm and dry. No rash noted. She is not diaphoretic. No erythema. No pallor.  Psychiatric: She has a normal mood and affect. Her behavior is normal. Judgment and thought content normal.  Vitals reviewed.   Vitals:   02/02/16 1137  BP: 118/74  Pulse: 74  SpO2: 99%  Weight: 166 lb (75.3 kg)  Height: 5' 2.25" (1.581 m)   Estimated body mass index is 30.12 kg/m as calculated from the following:   Height as of this encounter: 5' 2.25" (1.581 m).   Weight as of this encounter: 166 lb (75.3 kg).      Assessment:       ICD-9-CM  ICD-10-CM   1. Asthma, severe persistent, uncomplicated 123456 [id number] CBC w/Diff  2. 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[place]) Q000111Q J82 CBC w/Diff       Plan:      Well cotnrolled subjective but even with this there is active ongoing airway inflammation baesd on elevated feno 80  Will try Cinqair approval  - if this fails we can go back to Nucala and will tell practice that you have no problems affording the co-pay Continue your asthma medicatioins as before Ensure flu shot in fall Ensure shingles vaccine before Cinqair  Followup 1 month   > 50% of this > 25 min visit spent in face to face counseling or coordination of care    Dr. Brand Males, M.D., [facility].C.P Pulmonary and Critical Care Medicine Staff Physician [facility] Pager: [phone], If no answer or between  15:00h - 7:00h: call [phone]  02/02/2016 12:43 PM

## 2016-02-02 NOTE — Patient Instructions (Addendum)
ICD-9-CM ICD-10-CM   1. Asthma, severe persistent, uncomplicated 123456 [id number]   2. 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[place]) Q000111Q J82     Well cotnrolled subjective but even with this there is active ongoing airway inflammation baesd on elevated feno 80  Will try Cinqair approval  - if this fails we can go back to Nucala and will tell practice that you have no problems affording the co-pay Continue your asthma medicatioins as before Ensure flu shot in fall Ensure shingles vaccine before Cinqair  Followup 1 month

## 2016-02-03 NOTE — Progress Notes (Signed)
Called and spoke to pt. Informed her of the results per MR. Pt verbalized understanding and denied any further questions or concerns at this time.  

## 2016-02-10 ENCOUNTER — Telehealth: Payer: Self-pay | Admitting: Internal Medicine

## 2016-02-10 NOTE — Telephone Encounter (Signed)
Cinqair prescription and service request form faxed. Will await PA.

## 2016-02-17 NOTE — Telephone Encounter (Signed)
Have not received PA, refaxed prescription form.

## 2016-02-18 ENCOUNTER — Encounter: Payer: Self-pay | Admitting: Family Medicine

## 2016-02-18 ENCOUNTER — Ambulatory Visit (INDEPENDENT_AMBULATORY_CARE_PROVIDER_SITE_OTHER): Payer: PPO | Admitting: Family Medicine

## 2016-02-18 VITALS — BP 105/73 | HR 80 | Ht 62.25 in | Wt 168.4 lb

## 2016-02-18 DIAGNOSIS — J4550 Severe persistent asthma, uncomplicated: Secondary | ICD-10-CM

## 2016-02-18 DIAGNOSIS — E2740 Unspecified adrenocortical insufficiency: Secondary | ICD-10-CM | POA: Diagnosis not present

## 2016-02-18 DIAGNOSIS — J324 Chronic pansinusitis: Secondary | ICD-10-CM

## 2016-02-18 DIAGNOSIS — G479 Sleep disorder, unspecified: Secondary | ICD-10-CM

## 2016-02-18 DIAGNOSIS — F32A Depression, unspecified: Secondary | ICD-10-CM

## 2016-02-18 DIAGNOSIS — J3089 Other allergic rhinitis: Secondary | ICD-10-CM | POA: Diagnosis not present

## 2016-02-18 DIAGNOSIS — R5383 Other fatigue: Secondary | ICD-10-CM

## 2016-02-18 DIAGNOSIS — Z6830 Body mass index (BMI) 30.0-30.9, adult: Secondary | ICD-10-CM

## 2016-02-18 DIAGNOSIS — F329 Major depressive disorder, single episode, unspecified: Secondary | ICD-10-CM

## 2016-02-18 DIAGNOSIS — T380X5A Adverse effect of glucocorticoids and synthetic analogues, initial encounter: Principal | ICD-10-CM

## 2016-02-18 MED ORDER — VENLAFAXINE HCL ER 150 MG PO CP24: ORAL_CAPSULE | ORAL | 0 refills | Status: DC

## 2016-02-18 MED ORDER — VENLAFAXINE HCL ER 75 MG PO CP24: ORAL_CAPSULE | ORAL | 0 refills | Status: DC

## 2016-02-18 NOTE — Patient Instructions (Addendum)
Do your nasal sinus rinses twice daily. After doing them, give one squirt of Flonase in each nostril after each time you to the rinse. Please take the Allegra-D or Zyrtec-D or etc. Daily   With gardening or anytime you are outside where an and 95 mask which will help prevent all the little dust particles and allergens from eating getting into your nasal passages and sinuses in the first place   Make sure you know how to take your Effexor, alternating doses every other day. So that you take 150 mg capsule one day and then 75 mg the next. Continue alternating doses. Never go off them or up without discussion with me.

## 2016-02-18 NOTE — Telephone Encounter (Signed)
Received PA on Cinqair. PA faxed back to [employer] with supporting data. Will await decision.

## 2016-02-18 NOTE — Progress Notes (Signed)
Impression and Recommendations:    1. Question of Steroid-induced adrenal suppression   2. Other fatigue   3. Steroid-dependent asthma, severe persistent, uncomplicated   4. Environmental and seasonal allergies   5. Chronic pansinusitis   6. BMI 30.0-30.9,adult   7. Sleep disorder due to stress   8. Depression      Fatigue/ chronic steroid use:  Patient worries that she has taken steroids for so long and this is affecting her pituitary adrenal axis and causing her excessive fatigue. Patient asked for a endocrinology referral.  Placed today. Non-urgent   Allergies\chronic sinusitis:   Needs to take Allegra D or Claritin-D daily. Flonase daily. Take this after sinus rinses twice a day. Preventative strategies with and 95 mask discussed and highly encouraged.  Explained to patient what BMI refers to, and what it means medically.    Told patient to think about it as a "medical risk stratification measurement" and how increasing BMI is associated with increasing risk/ or worsening state of various diseases such as hypertension, hyperlipidemia, diabetes, premature OA, depression etc. American Heart Association guidelines for healthy diet, basically Mediterranean diet, and exercise guidelines of 30 minutes 5 days per week or more discussed in detail.  Health counseling performed.  All questions answered.  - Avoid caffeinated beverages after lunch,  no alcoholic beverages,  no eating within 2-3 hours of lying down,  avoid exposure to blue light before bed - Recommend patient meditate or do deep breathing exercises to help relax.   Incorporate the use of white noise machines or listen to "sleep meditation music", or recordings of guided meditations for sleep from YouTube which are free, such as  "guided meditation for detachment from over thinking"  by Mayford Knife.     Make sure you know how to take your Effexor, alternating doses every other day. So that you take 150 mg capsule one day  and then 75 mg the next. Continue alternating doses. Never go off them or up without discussion with me.  Pt was in the office today for 40+ minutes, with over 50% time spent in face to face counseling of various medical concerns and in coordination of care  Patient's Medications  New Prescriptions   VENLAFAXINE XR (EFFEXOR XR) 150 MG 24 HR CAPSULE    Take 1 capsule every other day (the alternating with the 75 mg cap)   VENLAFAXINE XR (EFFEXOR XR) 75 MG 24 HR CAPSULE    Take the 75 mg tablet one day, then the 150 mg the next, then continue to alternate doses every other day  Previous Medications   ALPRAZOLAM (XANAX) 0.5 MG TABLET    Take 1 tablet (0.5 mg total) by mouth 2 (two) times daily as needed for anxiety (Palpatations, SOB).   ASPIRIN 81 MG TABLET    Take 81 mg by mouth daily.    BUDESONIDE (PULMICORT FLEXHALER) 180 MCG/ACT INHALER    Inhale 2 puffs into the lungs 2 (two) times daily.   CHOLECALCIFEROL (VITAMIN D) 2000 UNITS CAPS    Take 2,000 Units by mouth daily.    COENZYME Q10 (CO Q 10 PO)    Take 200 mg by mouth 2 (two) times daily.   FEXOFENADINE (ALLEGRA) 180 MG TABLET    Take 180 mg by mouth daily as needed for allergies.    FLUTICASONE (FLONASE) 50 MCG/ACT NASAL SPRAY    Place 2 sprays into both nostrils daily.   FUROSEMIDE (LASIX) 20 MG TABLET  Take 1 tablet (20 mg total) by mouth daily as needed.   HYDROCODONE-HOMATROPINE (HYCODAN) 5-1.5 MG/5ML SYRUP    Take 5 mLs by mouth every 8 (eight) hours as needed for cough.   IBUPROFEN (ADVIL,MOTRIN) 200 MG TABLET    Take 200 mg by mouth every 6 (six) hours as needed for mild pain or moderate pain.   IPRATROPIUM-ALBUTEROL (DUONEB) 0.5-2.5 (3) MG/3ML SOLN    INHALE THE CONTENTS OF 1 VIAL VIA NEBULIZER UP TO 4 TO 6 TIMES A DAY.   MELATONIN 1 MG SUBL    Take it nightly   MONTELUKAST (SINGULAIR) 10 MG TABLET    TAKE 1 TABLET BY MOUTH once daily   MUPIROCIN NASAL OINTMENT (BACTROBAN) 2 %    Place 1 application into the nose 2 (two) times  daily. Use one-half of tube in each nostril twice daily for five (5) days. After application, press sides of nose together and gently massage.   NITROGLYCERIN (NITROSTAT) 0.4 MG SL TABLET    Place 1 tablet (0.4 mg total) under the tongue every 5 (five) minutes as needed for chest pain.   OMEGA-3 FAT AC-CHOLECALCIFEROL (OMEGA-3 + VITAMIN D3 ULTRA STR) LIQD    Take 1 Units by mouth daily. 1 teaspoon daily- 1600mg  omega 3   OMEPRAZOLE-SODIUM BICARBONATE (ZEGERID OTC PO)    Take 1 tablet by mouth daily.   OVER THE COUNTER MEDICATION    Take 1 capsule by mouth daily. Tumeric   PRENATAL VIT-FE FUMARATE-FA (M-VIT) TABLET    Take 1 tablet by mouth daily.   PROBIOTIC PRODUCT (PROBIOTIC-10 PO)    Take 1 tablet by mouth daily.   TIOTROPIUM (SPIRIVA HANDIHALER) 18 MCG INHALATION CAPSULE    INHALE THE CONTENTS OF ONE CAPSULE DAILY  Modified Medications   No medications on file  Discontinued Medications   VENLAFAXINE XR (EFFEXOR-XR) 150 MG 24 HR CAPSULE    TAKE 1 CAPSULE BY MOUTH DAILY WITH BREAKFAST    Return in about 8 weeks (around 04/14/2016) for changes to Effexor dose.  The patient was counseled, risk factors were discussed, anticipatory guidance given.  Gross side effects, risk and benefits, and alternatives of medications discussed with patient.  Patient is aware that all medications have potential side effects and we are unable to predict every side effect or drug-drug interaction that may occur.  Expresses verbal understanding and consents to current therapy plan and treatment regimen.  Please see AVS handed out to patient at the end of our visit for further patient instructions/ counseling done pertaining to today's office visit.    Note: This document was prepared using Dragon voice recognition software and may include unintentional dictation  errors.   --------------------------------------------------------------------------------------------------------------------------------------------------------------------------------------------------------------------------------------------    Subjective:    CC:  Chief Complaint  Patient presents with  . Depression  . Sinus Problem    HPI: Victoria [name] is a 60 y.o. female who presents to [place] [facility] today for issues as discussed below.   Talking about her frustrations about her last Pulm OV.  Has several Q's/ concerns about her meds etc   Sinuses- woke up this am with head pressure over frontal sinuses.  Last CT sinuses- Dr Janace Hoard- in 2006 or 2007.   She doesn't want to take allegra or flonase all the time.  Gets PND--> goes into chest at times.      low energy and fatigue- struggles with it.   Worries that her chronic steroid use could be causeing an adrenal.  Mood- feels like effexor makes her too flat, feels that her libido has decreased on this medicine; denies anorgasm.  It has helped with her mood but thinks it's too much   Sleep- diff staying asleep but has to be vigilent for disabled son, not using melatonin nightly    Wt Readings from Last 3 Encounters:  02/18/16 168 lb 6.4 oz (76.4 kg)  02/02/16 166 lb (75.3 kg)  01/12/16 164 lb 14.4 oz (74.8 kg)   BP Readings from Last 3 Encounters:  02/18/16 105/73  02/02/16 118/74  01/12/16 113/79   Pulse Readings from Last 3 Encounters:  02/18/16 80  02/02/16 74  01/12/16 73     Patient Active Problem List   Diagnosis Date Noted  . BMI 30.0-30.9,adult 02/18/2016    Priority: High  . Sleep disorder due to stress 01/12/2016    Priority: High  . Hyperglycemia 11/20/2015    Priority: High  . Anxiety state 08/18/2013    Priority: High  . HLD (hyperlipidemia) 09/11/2007    Priority: High  . Depression 12/11/2006    Priority: High  . MRSA (methicillin resistant Staphylococcus  aureus) colonization 01/12/2016    Priority: Medium  . Chronic diastolic CHF (congestive heart failure), NYHA class 2 ([place]) 06/26/2015    Priority: Medium  . Seasonal and perennial allergic rhinitis 10/22/2011    Priority: Medium  . OSA (obstructive sleep apnea) 11/13/2010    Priority: Medium  . Environmental allergies 11/13/2010    Priority: Medium  . DIVERTICULOSIS, COLON 06/11/2010    Priority: Medium  . ELEVATED BP READING WITHOUT DX HYPERTENSION 08/03/2009    Priority: Medium  . Esophageal reflux 10/21/2008    Priority: Medium  . STRESS INCONTINENCE 09/11/2007    Priority: Medium  . Osteoarthritis 12/11/2006    Priority: Medium  . Disorder of bone and cartilage 12/11/2006    Priority: Medium  . Coronary artery disease due to lipid rich plaque 06/26/2015  . 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[place]) [id number]  . Cardiomegaly 11/21/2014  . Fatigue 09/01/2011  . Chest pain, atypical 06/01/2011  . UMBILICAL HERNIA [id number]  . HEPATIC CYST 06/11/2010  . SCOLIOSIS 06/11/2010  . TRANSAMINASES, SERUM, ELEVATED 05/27/2010  . h/o PALPITATIONS 08/19/2008  . CHONDROMALACIA OF PATELLA 04/15/2008  . A B P A-ALLERGIC BRONCHOPULMONARY ASPERGILLOSIS 12/14/2007  . Asthma 11/16/2007  . Cushing's syndrome ([place]) 12/11/2006    Past Medical history, Surgical history, Family history, Social history, Allergies and Medications have been entered into the medical record, reviewed and changed as needed.   Allergies:  Allergies  Allergen Reactions  . Beclomethasone Dipropionate Hives and Other (See Comments)     weight gain  . Budesonide-Formoterol Fumarate Hives  . Mometasone Furo-Formoterol Fum Hives and Other (See Comments)    weight gain  . Sulfonamide Derivatives Hives and Rash  . Statins     Myalgias, RLS    Objective:   Blood pressure 105/73, pulse 80, height 5' 2.25" (1.581 m), weight 168 lb 6.4 oz (76.4 kg). Body mass index is 30.55 kg/m. General: Well Developed, well nourished,  appropriate for stated age.  Neuro: Alert and oriented x3, extra-ocular muscles intact, sensation grossly intact.  HEENT: Normocephalic, atraumatic, neck supple   Skin: Warm and dry, no gross rash. Cardiac: RRR, S1 S2,  no murmurs rubs or gallops.  Respiratory: ECTA B/L, Not using accessory muscles, speaking in full sentences-unlabored. Vascular:  No gross lower ext edema, cap RF less 2 sec. Psych: No HI/SI, judgement and insight good, Euthymic mood. Full Affect.  Review of Systems: No fever/ chills, night sweats, no unintended weight loss, No chest pain, or increased shortness of breath. No N/V/D.  Pertinent positives and negatives noted in HPI above  Review of Systems  Constitutional: Positive for malaise/fatigue.  Eyes: Negative for blurred vision and double vision.  Respiratory: Negative for shortness of breath and wheezing.        No more so than usual.  Cardiovascular: Negative for chest pain, palpitations, orthopnea, leg swelling and PND.  Gastrointestinal: Negative for diarrhea, nausea and vomiting.  Genitourinary: Negative for dysuria.  Musculoskeletal: Positive for myalgias.  Skin: Negative for rash.  Neurological: Negative for focal weakness and weakness.  Psychiatric/Behavioral: Negative for depression. The patient has insomnia. The patient is not nervous/anxious.

## 2016-02-19 NOTE — Telephone Encounter (Signed)
[facility] closed at this time and will need to call back Tuesday 02-23-16 between 8am to 5pm. Will route to [place] as she is handling this case.

## 2016-02-19 NOTE — Telephone Encounter (Signed)
[name] called from [facility] would like a call back [phone].

## 2016-02-25 ENCOUNTER — Ambulatory Visit (INDEPENDENT_AMBULATORY_CARE_PROVIDER_SITE_OTHER): Payer: PPO | Admitting: Adult Health

## 2016-02-25 ENCOUNTER — Encounter: Payer: Self-pay | Admitting: Adult Health

## 2016-02-25 DIAGNOSIS — J4551 Severe persistent asthma with (acute) exacerbation: Secondary | ICD-10-CM | POA: Diagnosis not present

## 2016-02-25 MED ORDER — PREDNISONE 10 MG PO TABS: ORAL_TABLET | ORAL | 0 refills | Status: DC

## 2016-02-25 MED ORDER — HYDROCODONE-HOMATROPINE 5-1.5 MG/5ML PO SYRP: 5.0000 mL | ORAL_SOLUTION | Freq: Three times a day (TID) | ORAL | 0 refills | Status: DC | PRN

## 2016-02-25 MED ORDER — TIOTROPIUM BROMIDE MONOHYDRATE 1.25 MCG/ACT IN AERS: 2.0000 | INHALATION_SPRAY | Freq: Every day | RESPIRATORY_TRACT | 0 refills | Status: DC

## 2016-02-25 MED ORDER — AZITHROMYCIN 250 MG PO TABS: ORAL_TABLET | ORAL | 0 refills | Status: AC

## 2016-02-25 MED ORDER — FLUTICASONE-SALMETEROL 232-14 MCG/ACT IN AEPB: 1.0000 | INHALATION_SPRAY | Freq: Two times a day (BID) | RESPIRATORY_TRACT | 0 refills | Status: DC

## 2016-02-25 MED ORDER — TIOTROPIUM BROMIDE MONOHYDRATE 18 MCG IN CAPS: ORAL_CAPSULE | RESPIRATORY_TRACT | 3 refills | Status: DC

## 2016-02-25 NOTE — Addendum Note (Signed)
Addended by: Mathis Dad on: 02/25/2016 05:26 PM   Modules accepted: Orders

## 2016-02-25 NOTE — Progress Notes (Signed)
Subjective:    Patient ID: Victoria [name], female    DOB: September 01, 1955, 60 y.o.   MRN: [id number]  HPI 60 yo female with severe persistent asthma w/ ABPA plus eosinophilia    TEST  Spirometry 2015 FEV1 58%, ratio 70, FVC 67%.  FeNO 05/05/15 >80  IgE 1384 2012 IgE 1267 2013  CTA Chest 2016 neg for PE , RLL PNA , unchanged 2mm RLL nodule , biapical pleural thickening.  CT sinus 2013 Minimal mucosal disease  CT sinus 2007 left max sinusitis    02/25/2016  Acute OV Follow up : Asthma/ABPA/Eosinophilia  Pt presents for an acute office visit. She complains of cough, congestion, tightness and sinus drainage.  In process of looking at  [place]  Instead of Nucala due to cost. She has not been able to afford Nucala .  On Spiriva. Previous on Asmanex is not covered by insurance Started on Pulmicort but unable to tolerate. Feels it gives her headaches.  In the past Intolerant to Symbicort, QVAR and Dulera.  Not taking flonase or allergra on regular basis .  Took prednisone 30mg  daily this morning .  Complains on nasal drainage and congestion .  Cough is getting worse with thick yellow mucus.        Past Medical History:  Diagnosis Date  . Allergic bronchopulmonary aspergillosis ([place]) 2008   sees Dr Edmund Hilda pulmonology  . Anemia    iron deficiency, resolved  . Anxiety   . Asthma   . CAD (coronary artery disease)    a. LHC 6/16:  oOM1 60, pRCA 25 >> med Rx  . COPD (chronic obstructive pulmonary disease) ([place])   . Depression    mild  . Diverticulosis   . GERD (gastroesophageal reflux disease)   . H/O hiatal hernia   . History of echocardiogram    Echo 6/16:  Mod LVH, EF 60-65%, no RWMA, Gr 1 DD, trivial MR, normal LA size.  Marland Kitchen Hyperglycemia 11/20/2015  . Hyperlipidemia   . IBS (irritable bowel syndrome)   . Maxillary sinusitis   . Normal cardiac stress test 11/2011   No evidence of ischemia or infarct. Calculated ejection fraction 72%.  . Obesity   . OSA (obstructive  sleep apnea) 02/2012   has stopped using  cpap  . Osteoarthritis   . Osteoporosis   . Pneumonia 11/2011   "before 2013 I hadn't had pneumonia since I was a child" (04/13/2012)  . Pulmonary nodules   . Schatzki's ring    Current Outpatient Prescriptions on File Prior to Visit  Medication Sig Dispense Refill  . ALPRAZolam (XANAX) 0.5 MG tablet Take 1 tablet (0.5 mg total) by mouth 2 (two) times daily as needed for anxiety (Palpatations, SOB). 30 tablet 1  . aspirin 81 MG tablet Take 81 mg by mouth daily.     . Cholecalciferol (VITAMIN D) 2000 UNITS CAPS Take 2,000 Units by mouth daily.     . Coenzyme Q10 (CO Q 10 PO) Take 200 mg by mouth 2 (two) times daily.    . fexofenadine (ALLEGRA) 180 MG tablet Take 180 mg by mouth daily as needed for allergies.     . fluticasone (FLONASE) 50 MCG/ACT nasal spray Place 2 sprays into both nostrils daily. 16 g 1  . furosemide (LASIX) 20 MG tablet Take 1 tablet (20 mg total) by mouth daily as needed. 30 tablet 1  . HYDROcodone-homatropine (HYCODAN) 5-1.5 MG/5ML syrup Take 5 mLs by mouth every 8 (eight) hours as needed for cough.  120 mL 0  . ibuprofen (ADVIL,MOTRIN) 200 MG tablet Take 200 mg by mouth every 6 (six) hours as needed for mild pain or moderate pain.    Marland Kitchen ipratropium-albuterol (DUONEB) 0.5-2.5 (3) MG/3ML SOLN INHALE THE CONTENTS OF 1 VIAL VIA NEBULIZER UP TO 4 TO 6 TIMES A DAY. 360 mL 2  . Melatonin 1 MG SUBL Take it nightly (Patient taking differently: Take 300 mcg by mouth at bedtime. Take it nightly) 30 tablet 0  . montelukast (SINGULAIR) 10 MG tablet TAKE 1 TABLET BY MOUTH once daily 90 tablet 1  . mupirocin nasal ointment (BACTROBAN) 2 % Place 1 application into the nose 2 (two) times daily. Use one-half of tube in each nostril twice daily for five (5) days. After application, press sides of nose together and gently massage.    . nitroGLYCERIN (NITROSTAT) 0.4 MG SL tablet Place 1 tablet (0.4 mg total) under the tongue every 5 (five) minutes as  needed for chest pain. 25 tablet 0  . Omega-3 Fat Ac-Cholecalciferol (OMEGA-3 + VITAMIN D3 ULTRA STR) LIQD Take 1 Units by mouth daily. 1 teaspoon daily- 1600mg  omega 3    . Omeprazole-Sodium Bicarbonate (ZEGERID OTC PO) Take 1 tablet by mouth daily.    Marland Kitchen OVER THE COUNTER MEDICATION Take 1 capsule by mouth daily. Tumeric    . Probiotic Product (PROBIOTIC-10 PO) Take 1 tablet by mouth daily.    Marland Kitchen tiotropium (SPIRIVA HANDIHALER) 18 MCG inhalation capsule INHALE THE CONTENTS OF ONE CAPSULE DAILY 90 capsule 1  . venlafaxine XR (EFFEXOR XR) 150 MG 24 hr capsule Take 1 capsule every other day (the alternating with the 75 mg cap) 90 capsule 0  . venlafaxine XR (EFFEXOR XR) 75 MG 24 hr capsule Take the 75 mg tablet one day, then the 150 mg the next, then continue to alternate doses every other day 90 capsule 0   No current facility-administered medications on file prior to visit.       Review of Systems Constitutional:   No  weight loss, night sweats,  Fevers, chills, fatigue, or  lassitude.  HEENT:   No headaches,  Difficulty swallowing,  Tooth/dental problems, or  Sore throat,                No sneezing, itching, ear ache, + nasal congestion, post nasal drip,   CV:  No chest pain,  Orthopnea, PND, swelling in lower extremities, anasarca, dizziness, palpitations, syncope.   GI  No heartburn, indigestion, abdominal pain, nausea, vomiting, diarrhea, change in bowel habits, loss of appetite, bloody stools.   Resp:    No chest wall deformity  Skin: no rash or lesions.  GU: no dysuria, change in color of urine, no urgency or frequency.  No flank pain, no hematuria   MS:  No joint pain or swelling.  No decreased range of motion.  No back pain.  Psych:  No change in mood or affect. No depression or anxiety.  No memory loss.         Objective:   Physical Exam  Vitals:   02/25/16 1054  BP: 120/84  Pulse: (!) 103  Temp: 98.2 F (36.8 C)  TempSrc: Oral  SpO2: 99%  Weight: 167 lb  (75.8 kg)  Height: 5\' 3"  (1.6 m)   GEN: A/Ox3; pleasant , NAD, well nourished    HEENT:  [place]/AT,  EACs-clear, TMs-wnl, NOSE-clear drainage  THROAT-clear, no lesions, no postnasal drip or exudate noted.   NECK:  Supple w/ fair ROM; no  JVD; normal carotid impulses w/o bruits; no thyromegaly or nodules palpated; no lymphadenopathy.    RESP  Decreased BS in bases . no accessory muscle use, no dullness to percussion  CARD:  RRR, no m/r/g  , no peripheral edema, pulses intact, no cyanosis or clubbing.  GI:   Soft & nt; nml bowel sounds; no organomegaly or masses detected.   Musco: Warm bil, no deformities or joint swelling noted.   Neuro: alert, no focal deficits noted.    Skin: Warm, no lesions or rashes   Tammy Parrett NP-C  [facility]  02/25/2016

## 2016-02-25 NOTE — Assessment & Plan Note (Signed)
Recurrent flare complicated by ABPA (high IgE ) and Eosinophilia  Currently being evaluated for Nucala or Cinqair (cost is a factor)  On return if not imrproving , consider sputum cx +/- CT sinus/Chest and repeat IgE if off prednisone  Discussed controller medication for asthma along with trigger control  Needs to be on allegra/flonase on regular basis  Needs  ICS on regular basis .  Pt education on Asthma   Plan  Patient Instructions  Begin AirDuo 1 puff Twice daily  , rinse well after use.  Continue on Spiriva .  Zpack take as directed.  Prednisone taper over next week.  Saline nasal rinses As needed   Take Allegra daily  Take Flonase daily  Follow up with Dr. Chase Caller in 3 weeks and As needed   Please contact office for sooner follow up if symptoms do not improve or worsen or seek emergency care

## 2016-02-25 NOTE — Patient Instructions (Addendum)
Begin AirDuo 1 puff Twice daily  , rinse well after use.  Continue on Spiriva .  Zpack take as directed.  Prednisone taper over next week.  Saline nasal rinses As needed   Take Allegra daily  Take Flonase daily  Follow up with Dr. Chase Caller in 3 weeks and As needed   Please contact office for sooner follow up if symptoms do not improve or worsen or seek emergency care

## 2016-02-26 ENCOUNTER — Encounter: Payer: Self-pay | Admitting: Adult Health

## 2016-02-26 NOTE — Telephone Encounter (Signed)
Received fax from [facility] requesting CPT code. Spoke with [name] and she is looking into the CPT code and will get back with me.

## 2016-02-26 NOTE — Telephone Encounter (Signed)
Pt would like to see about changing to Spiriva Respimat and also has questions about using AirDuo. Pt aware you are not in office until Monday.  MR - Please advise. Thanks!

## 2016-02-29 NOTE — Telephone Encounter (Signed)
MR - Please advise. Thanks!

## 2016-03-01 NOTE — Telephone Encounter (Signed)
Thgis is part of all general delays with IL5-RAb (nucala or cinqair) that goes on. Not sure why there is a lot of delay in getting these drugs approved. Takes months at our practice. Daneil Dan please address  Dr. Brand Males, M.D., [facility].C.P Pulmonary and Critical Care Medicine Staff Physician [place] Pager: [phone], If no answer or between  15:00h - 7:00h: call [phone]  03/01/2016 8:52 AM

## 2016-03-01 NOTE — Telephone Encounter (Signed)
AirDuo is generic advair/breo/dulera/symbicort - she can take it like how she was taking those. She has had allergies to LABA/ICS in past so she needs to watch out. While taking airdu0 she needs to be cautious not to overdo albuterol. Can it take it now while on flare but do not overdo albuterol  Spiriva - ok for respimat  Dr. Brand Males, M.D., [facility].C.P Pulmonary and Critical Care Medicine Staff Physician [place] [facility] Pager: [phone], If no answer or between  15:00h - 7:00h: call [phone]  03/01/2016 8:58 AM       Allergies  Allergen Reactions  . Beclomethasone Dipropionate Hives and Other (See Comments)     weight gain  . Budesonide-Formoterol Fumarate Hives  . Mometasone Furo-Formoterol Fum Hives and Other (See Comments)    weight gain  . Sulfonamide Derivatives Hives and Rash  . Pulmicort [Budesonide]     Headaches   . Statins     Myalgias, RLS

## 2016-03-04 NOTE — Telephone Encounter (Signed)
Received CPT code [id number]) for [place]. Records and PA faxed back to [facility] on 03/02/16. Will await decision.

## 2016-03-17 NOTE — Telephone Encounter (Signed)
Called [employer] and was informed pt does not need a PA and pt has already been "approved" and [employer] is needing to contact [facility] short stay to begin therapy and will contact pt to inform of status update. Will await fax that reflects this information.

## 2016-03-18 ENCOUNTER — Telehealth: Payer: Self-pay | Admitting: Internal Medicine

## 2016-03-18 NOTE — Telephone Encounter (Signed)
Spoke with Jeannine Boga, states that short stay does not have the short stay forms and is requesting this to be refaxed.   I attempted to find short stay order for pt, but was unable to find it in MR's cubby.   Maudie Mercury is aware that this will be faxed on Monday.    Daneil Dan please advise if you have this form, and if it can be refaxed.  Thanks.

## 2016-03-22 ENCOUNTER — Encounter: Payer: Self-pay | Admitting: Internal Medicine

## 2016-03-22 ENCOUNTER — Ambulatory Visit (INDEPENDENT_AMBULATORY_CARE_PROVIDER_SITE_OTHER): Payer: PPO | Admitting: Internal Medicine

## 2016-03-22 VITALS — BP 130/72 | HR 100 | Ht 62.5 in | Wt 171.0 lb

## 2016-03-22 DIAGNOSIS — J4550 Severe persistent asthma, uncomplicated: Secondary | ICD-10-CM | POA: Diagnosis not present

## 2016-03-22 DIAGNOSIS — B4481 Allergic bronchopulmonary aspergillosis: Secondary | ICD-10-CM

## 2016-03-22 DIAGNOSIS — J8283 Eosinophilic asthma: Secondary | ICD-10-CM

## 2016-03-22 DIAGNOSIS — J45909 Unspecified asthma, uncomplicated: Secondary | ICD-10-CM | POA: Insufficient documentation

## 2016-03-22 DIAGNOSIS — J82 Pulmonary eosinophilia, not elsewhere classified: Secondary | ICD-10-CM | POA: Diagnosis not present

## 2016-03-22 NOTE — Progress Notes (Signed)
Subjective:     Patient ID: Victoria [name], female   DOB: 1955-08-20, 60 y.o.   MRN: [id number]  HPI   OV 03/22/2016  Chief Complaint  Patient presents with  . Follow-up    44mo rov. pt states breathing is doing well. no new concerns today.    60 year old female with severe persistent asthma and allergy bronchopulmonary aspergillosis and use of chronic asthma. Presents for routine follow-up. Last visit with me was mid-August 2017 and with NPmid-September 2017. Currently asthma is without flare up. She declined flu shot currently asthma is without flare up. Her interleukin-5 receptor antibody IV infusionhas been approved. There is some delay with the paperwork to the short stay center. She is eagerly anticipating starting this. Currently she is off prednisone.     has a past medical history of Allergic bronchopulmonary aspergillosis ([place]) (2008); Anemia; Anxiety; Asthma; CAD (coronary artery disease); COPD (chronic obstructive pulmonary disease) ([place]); Depression; Diverticulosis; GERD (gastroesophageal reflux disease); H/O hiatal hernia; History of echocardiogram; Hyperglycemia (11/20/2015); Hyperlipidemia; IBS (irritable bowel syndrome); Maxillary sinusitis; Normal cardiac stress test (11/2011); Obesity; OSA (obstructive sleep apnea) (02/2012); Osteoarthritis; Osteoporosis; Pneumonia (11/2011); Pulmonary nodules; and Schatzki's ring.   reports that she has never smoked. She has never used smokeless tobacco.  Past Surgical History:  Procedure Laterality Date  . APPENDECTOMY  1989  . CARDIAC CATHETERIZATION N/A 11/25/2014   Procedure: Right/Left Heart Cath and Coronary Angiography;  Surgeon: Belva Crome, MD;  Location: [facility];  Service: Cardiovascular;  Laterality: N/A;  . CESAREAN SECTION  1985  . HERNIA REPAIR  04/13/2012   VHR laparoscopic  . VENTRAL HERNIA REPAIR  04/13/2012   Procedure: LAPAROSCOPIC VENTRAL HERNIA;  Surgeon: Adin Hector, MD;  Location: [facility];  Service:  General;  Laterality: N/A;  laparoscopic repair of incarcerated hernia    Allergies  Allergen Reactions  . Beclomethasone Dipropionate Hives and Other (See Comments)     weight gain  . Budesonide-Formoterol Fumarate Hives  . Mometasone Furo-Formoterol Fum Hives and Other (See Comments)    weight gain  . Sulfonamide Derivatives Hives and Rash  . Pulmicort [Budesonide]     Headaches   . Statins     Myalgias, RLS    Immunization History  Administered Date(s) Administered  . Influenza Split 04/20/2011  . Influenza Whole 06/06/2007, 04/15/2008, 04/02/2009, 03/29/2012  . Influenza, High Dose Seasonal PF 05/05/2015  . Influenza,inj,Quad PF,36+ Mos 05/09/2013, 03/03/2014  . Pneumococcal Conjugate-13 05/09/2013  . Pneumococcal Polysaccharide-23 05/04/2005  . Td 07/29/2009  . Tdap 03/11/2015    Family History  Problem Relation Age of Onset  . Breast cancer Mother   . Hypertension Mother   . Diabetes Mother   . Diverticulosis Father   . Prostate cancer Father     prostate  . Pulmonary embolism Brother     recurrent  . Heart attack Maternal Grandfather   . Breast cancer Sister     breast  . Stroke Neg Hx      Current Outpatient Prescriptions:  .  ALPRAZolam (XANAX) 0.5 MG tablet, Take 1 tablet (0.5 mg total) by mouth 2 (two) times daily as needed for anxiety (Palpatations, SOB)., Disp: 30 tablet, Rfl: 1 .  aspirin 81 MG tablet, Take 81 mg by mouth daily. , Disp: , Rfl:  .  Cholecalciferol (VITAMIN D) 2000 UNITS CAPS, Take 2,000 Units by mouth daily. , Disp: , Rfl:  .  Coenzyme Q10 (CO Q 10 PO), Take 200 mg by mouth 2 (two) times  daily., Disp: , Rfl:  .  dextromethorphan-guaiFENesin (MUCINEX DM) 30-600 MG 12hr tablet, Take 1 tablet by mouth 3 (three) times daily., Disp: , Rfl:  .  fexofenadine (ALLEGRA) 180 MG tablet, Take 180 mg by mouth daily as needed for allergies. , Disp: , Rfl:  .  fluticasone (FLONASE) 50 MCG/ACT nasal spray, Place 2 sprays into both nostrils daily.,  Disp: 16 g, Rfl: 1 .  Fluticasone-Salmeterol (AIRDUO RESPICLICK [id number]) [id number] MCG/ACT AEPB, Inhale 1 puff into the lungs 2 (two) times daily., Disp: 1 each, Rfl: 0 .  furosemide (LASIX) 20 MG tablet, Take 1 tablet (20 mg total) by mouth daily as needed., Disp: 30 tablet, Rfl: 1 .  HYDROcodone-homatropine (HYCODAN) 5-1.5 MG/5ML syrup, Take 5 mLs by mouth every 8 (eight) hours as needed for cough., Disp: 120 mL, Rfl: 0 .  ibuprofen (ADVIL,MOTRIN) 200 MG tablet, Take 200 mg by mouth every 6 (six) hours as needed for mild pain or moderate pain., Disp: , Rfl:  .  ipratropium-albuterol (DUONEB) 0.5-2.5 (3) MG/3ML SOLN, INHALE THE CONTENTS OF 1 VIAL VIA NEBULIZER UP TO 4 TO 6 TIMES A DAY., Disp: 360 mL, Rfl: 2 .  Melatonin 1 MG SUBL, Take it nightly (Patient taking differently: Take 300 mcg by mouth at bedtime. Take it nightly), Disp: 30 tablet, Rfl: 0 .  montelukast (SINGULAIR) 10 MG tablet, TAKE 1 TABLET BY MOUTH once daily, Disp: 90 tablet, Rfl: 1 .  mupirocin nasal ointment (BACTROBAN) 2 %, Place 1 application into the nose 2 (two) times daily. Use one-half of tube in each nostril twice daily for five (5) days. After application, press sides of nose together and gently massage., Disp: , Rfl:  .  nitroGLYCERIN (NITROSTAT) 0.4 MG SL tablet, Place 1 tablet (0.4 mg total) under the tongue every 5 (five) minutes as needed for chest pain., Disp: 25 tablet, Rfl: 0 .  Omega-3 Fat Ac-Cholecalciferol (OMEGA-3 + VITAMIN D3 ULTRA STR) LIQD, Take 1 Units by mouth daily. 1 teaspoon daily- 1600mg  omega 3, Disp: , Rfl:  .  Omeprazole-Sodium Bicarbonate (ZEGERID OTC PO), Take 1 tablet by mouth daily., Disp: , Rfl:  .  OVER THE COUNTER MEDICATION, Take 1 capsule by mouth daily. Tumeric, Disp: , Rfl:  .  Probiotic Product (PROBIOTIC-10 PO), Take 1 tablet by mouth daily., Disp: , Rfl:  .  tiotropium (SPIRIVA HANDIHALER) 18 MCG inhalation capsule, INHALE THE CONTENTS OF ONE CAPSULE DAILY, Disp: 90 capsule, Rfl: 3 .   venlafaxine XR (EFFEXOR XR) 150 MG 24 hr capsule, Take 1 capsule every other day (the alternating with the 75 mg cap), Disp: 90 capsule, Rfl: 0 .  venlafaxine XR (EFFEXOR XR) 75 MG 24 hr capsule, Take the 75 mg tablet one day, then the 150 mg the next, then continue to alternate doses every other day, Disp: 90 capsule, Rfl: 0   Review of Systems     Objective:   Physical Exam  Constitutional: She is oriented to person, place, and time. She appears well-developed and well-nourished. No distress.  HENT:  Head: Normocephalic and atraumatic.  Right Ear: External ear normal.  Left Ear: External ear normal.  Mouth/Throat: Oropharynx is clear and moist. No oropharyngeal exudate.  Eyes: Conjunctivae and EOM are normal. Pupils are equal, round, and reactive to light. Right eye exhibits no discharge. Left eye exhibits no discharge. No scleral icterus.  Neck: Normal range of motion. Neck supple. No JVD present. No tracheal deviation present. No thyromegaly present.  Cardiovascular: Normal rate, regular rhythm, normal  heart sounds and intact distal pulses.  Exam reveals no gallop and no friction rub.   No murmur heard. Pulmonary/Chest: Effort normal and breath sounds normal. No respiratory distress. She has no wheezes. She has no rales. She exhibits no tenderness.  Abdominal: Soft. Bowel sounds are normal. She exhibits no distension and no mass. There is no tenderness. There is no rebound and no guarding.  Musculoskeletal: Normal range of motion. She exhibits no edema or tenderness.  Lymphadenopathy:    She has no cervical adenopathy.  Neurological: She is alert and oriented to person, place, and time. She has normal reflexes. No cranial nerve deficit. She exhibits normal muscle tone. Coordination normal.  Skin: Skin is warm and dry. No rash noted. She is not diaphoretic. No erythema. No pallor.  Psychiatric: She has a normal mood and affect. Her behavior is normal. Judgment and thought content normal.   Vitals reviewed.  Vitals:   03/22/16 1507  BP: 130/72  Pulse: 100  SpO2: 98%  Weight: 171 lb (77.6 kg)  Height: 5' 2.5" (1.588 m)    Estimated body mass index is 30.78 kg/m as calculated from the following:   Height as of this encounter: 5' 2.5" (1.588 m).   Weight as of this encounter: 171 lb (77.6 kg).     Assessment:       ICD-9-CM ICD-10-CM   1. 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[place]) Q000111Q J82   2. A B P A-ALLERGIC BRONCHOPULMONARY ASPERGILLOSIS 518.6 B44.81   3. Severe persistent asthma without complication 123456 [id number]        Plan:      Asthma without flare currently Continue your medications as before FLu shot defered 03/22/2016 per your request  - please hav ewith PCP [place] paper work per my  Nurse - please talk to her about right order/referrral form   Followup 3 months or sooner if needed    Dr. Brand Males, M.D., [facility].C.P Pulmonary and Critical Care Medicine Staff Physician [facility] Pager: [phone], If no answer or between  15:00h - 7:00h: call [phone]  03/22/2016 3:53 PM

## 2016-03-22 NOTE — Patient Instructions (Signed)
ICD-9-CM ICD-10-CM   1. 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[name]) Q000111Q J82   2. A B P A-ALLERGIC BRONCHOPULMONARY ASPERGILLOSIS 518.6 B44.81   3. Severe persistent asthma without complication 123456 [id number]     Asthma without flare currently Continue your medications as before FLu shot defered 03/22/2016 per your request  - please hav ewith PCP [facility] paper work per my  Nurse - please talk to her about right order/referrral form   Followup 3 months or sooner if needed

## 2016-03-23 NOTE — Telephone Encounter (Signed)
Spoke with Maudie Mercury, rep with [employer], [facility] at short stay has not received the short stay form that was previously faxed with confirmation.Marland KitchenRe-faxed form to [facility], received confirmation. Kim aware. Will await till pt has appt for first infusion.

## 2016-03-23 NOTE — Telephone Encounter (Signed)
Pt calling back, needs to speak to nurse

## 2016-03-23 NOTE — Telephone Encounter (Signed)
Spoke with pt. She is needing to have this form faxed to short stay. I spoke with Daneil Dan and this was faxed today. Pt is aware of this. Nothing further was needed.

## 2016-03-24 ENCOUNTER — Encounter: Payer: Self-pay | Admitting: Internal Medicine

## 2016-03-24 NOTE — Telephone Encounter (Signed)
Called and spoke to [employer] support and was advised they are waiting on an appt to be made and are unsure if pt already has appt with short stay or not. Called and spoke to pt, she states she has not yet made an appt and will call [facility] today to schedule. Will keep phone note open till appt has been made.

## 2016-03-24 NOTE — Telephone Encounter (Signed)
lmtcb for [employer]

## 2016-03-24 NOTE — Telephone Encounter (Signed)
Forwarded to [place] for follow up.

## 2016-03-28 NOTE — Telephone Encounter (Signed)
Called and spoke to [facility] and was informed the short stay was received and she will anticipate the pts call to get an appt scheduled.Called and spoke to the pt and informed her to call [facility] to schedule appt. Pt verbalized understanding and denied any further questions or concerns at this time.

## 2016-03-29 ENCOUNTER — Encounter: Payer: Self-pay | Admitting: Internal Medicine

## 2016-03-29 MED ORDER — EPINEPHRINE 0.3 MG/0.3ML IJ SOAJ: 0.3000 mg | Freq: Once | INTRAMUSCULAR | 0 refills | Status: AC

## 2016-04-01 NOTE — Telephone Encounter (Signed)
See message from 8.23.17. Nothing further needed at this time.

## 2016-04-05 ENCOUNTER — Ambulatory Visit (INDEPENDENT_AMBULATORY_CARE_PROVIDER_SITE_OTHER): Payer: PPO | Admitting: Pharmacist Clinician (PhC)/ Clinical Pharmacy Specialist

## 2016-04-05 ENCOUNTER — Encounter: Payer: Self-pay | Admitting: Pharmacist Clinician (PhC)/ Clinical Pharmacy Specialist

## 2016-04-05 VITALS — Ht 62.5 in | Wt 170.8 lb

## 2016-04-05 DIAGNOSIS — E785 Hyperlipidemia, unspecified: Secondary | ICD-10-CM | POA: Diagnosis not present

## 2016-04-05 NOTE — Patient Instructions (Signed)
Continue to improve your diet with foods like oats, almonds and walnuts.   Avoid "white" foods whenever possible.  Continue with the Guggal twice daily.    Increase your exercise as you can tolerate.   Cholesterol Cholesterol is a fat. Your body needs a small amount of cholesterol. Cholesterol may build up in your blood vessels. This increases your chance of having a heart attack or stroke. You cannot feel your cholesterol levels. The only way to know your cholesterol level is high is with a blood test. Keep your test results. Work with your doctor to keep your cholesterol at a good level. WHAT DO THE TEST RESULTS MEAN?  Total cholesterol is how much cholesterol is in your blood.  LDL is bad cholesterol. This is the type that can build up. You want LDL to be low.  HDL is good cholesterol. It cleans your blood vessels and carries LDL away. You want HDL to be high.  Triglycerides are fat that the body can burn for energy or store. WHAT ARE GOOD LEVELS OF CHOLESTEROL?  Total cholesterol below 200.  LDL below 100 for people at risk. Below 70 for those at very high risk.  HDL above 50 is good. Above 60 is best.  Triglycerides below 150. HOW CAN I LOWER MY CHOLESTEROL?  Diet. Follow your diet programs as told by your doctor.  Choose fish, white meat chicken, roasted [place], or baked [place]. Try not to eat red meat, fried foods, or processed meats such as sausage and lunch meats.  Eat lots of fresh fruits and vegetables.  Choose whole grains, beans, pasta, potatoes, and cereals.  Use only small amounts of olive, corn, or canola oils.  Try not to eat butter, mayonnaise, shortening, or palm kernel oils.  Try not to eat foods with trans fats.  Drink skim or nonfat milk. Eat low-fat or nonfat yogurt and cheeses. Try not to drink whole milk or cream. Try not to eat ice cream, egg yolks, and full-fat cheeses.  Healthy desserts include angel food cake, ginger snaps, animal crackers,  hard candy, popsicles, and low-fat or nonfat frozen yogurt. Try not to eat pastries, cakes, pies, and cookies.  Exercise. Follow your exercise programs as told by your doctor.  Be more active. You can try gardening, walking, or taking the stairs. Ask your doctor about how you can be more active.  Medicine. Take medicine as told by your doctor.   This information is not intended to replace advice given to you by your health care provider. Make sure you discuss any questions you have with your health care provider.   Document Released: 09/02/2008 Document Revised: 06/27/2014 Document Reviewed: 03/20/2013 Elsevier Interactive Patient Education [employer].

## 2016-04-05 NOTE — Progress Notes (Signed)
04/05/2016 Victoria [name] [birth year] [id number]   HPI:  Victoria [name] is a 60 y.o. female patient of Dr Meda Coffee, who presents today for a lipid clinic evaluation.  She has occasional chest pain, which is often associated with asthma exacerbations.  Her history is significant for CAD with a 60% blockage in the ostial OM1.  Her asthma will be now treated with Cinqair, an infusion given every 4 weeks.  She is hopeful that this will help, as she has already had 7 prednisone courses in 2017, and is unable to do exercise, as it exacerbates the problems.  She was previously on another asthma biologic product with a copay of $600/month.  She was able to get assistance from the [employer] for awhile.    Current Medications:   Guggal (a gum resin used to lower cholesterol naturally)  Cholesterol Goals:   LDL <70   Intolerant/previously tried: (all caused muscle pains in the legs)  Rosuvastatin 10 mg up to three times weekly  Atorvastatin 40 mg  Simvastatin 40 mg  Ezetimibe 10 mg  Family history:   Only significant for maternal grandfather with MI at age 59.  Brother died from pulmonary embolism at 55.  Mother did have heart "issues", but died from breast cancer at age 47.    Diet:    Eats only home cooked foods, rarely in restaurants.  Avoids fried foods as well as "white" foods  Exercise:    Unable to do much, as asthma is easily exacerbated  Labs:  11/2015: TC 259, TG 125, HDL 52.1, LDL 182  Current Outpatient Prescriptions  Medication Sig Dispense Refill  . ALPRAZolam (XANAX) 0.5 MG tablet Take 1 tablet (0.5 mg total) by mouth 2 (two) times daily as needed for anxiety (Palpatations, SOB). 30 tablet 1  . aspirin 81 MG tablet Take 81 mg by mouth daily.     . Cholecalciferol (VITAMIN D) 2000 UNITS CAPS Take 2,000 Units by mouth daily.     . Coenzyme Q10 (CO Q 10 PO) Take 200 mg by mouth 2 (two) times daily.    Marland Kitchen dextromethorphan-guaiFENesin (MUCINEX DM) 30-600 MG 12hr tablet Take 1 tablet by  mouth 3 (three) times daily.    . fexofenadine (ALLEGRA) 180 MG tablet Take 180 mg by mouth daily as needed for allergies.     . fluticasone (FLONASE) 50 MCG/ACT nasal spray Place 2 sprays into both nostrils daily. 16 g 1  . Fluticasone-Salmeterol (AIRDUO RESPICLICK [id number]) [id number] MCG/ACT AEPB Inhale 1 puff into the lungs 2 (two) times daily. 1 each 0  . furosemide (LASIX) 20 MG tablet Take 1 tablet (20 mg total) by mouth daily as needed. 30 tablet 1  . HYDROcodone-homatropine (HYCODAN) 5-1.5 MG/5ML syrup Take 5 mLs by mouth every 8 (eight) hours as needed for cough. 120 mL 0  . ibuprofen (ADVIL,MOTRIN) 200 MG tablet Take 200 mg by mouth every 6 (six) hours as needed for mild pain or moderate pain.    Marland Kitchen ipratropium-albuterol (DUONEB) 0.5-2.5 (3) MG/3ML SOLN INHALE THE CONTENTS OF 1 VIAL VIA NEBULIZER UP TO 4 TO 6 TIMES A DAY. 360 mL 2  . Melatonin 1 MG SUBL Take it nightly (Patient taking differently: Take 300 mcg by mouth at bedtime. Take it nightly) 30 tablet 0  . montelukast (SINGULAIR) 10 MG tablet TAKE 1 TABLET BY MOUTH once daily 90 tablet 1  . mupirocin nasal ointment (BACTROBAN) 2 % Place 1 application into the nose 2 (two) times daily. Use one-half of tube in  each nostril twice daily for five (5) days. After application, press sides of nose together and gently massage.    . nitroGLYCERIN (NITROSTAT) 0.4 MG SL tablet Place 1 tablet (0.4 mg total) under the tongue every 5 (five) minutes as needed for chest pain. 25 tablet 0  . Omega-3 Fat Ac-Cholecalciferol (OMEGA-3 + VITAMIN D3 ULTRA STR) LIQD Take 1 Units by mouth daily. 1 teaspoon daily- 1600mg  omega 3    . Omeprazole-Sodium Bicarbonate (ZEGERID OTC PO) Take 1 tablet by mouth daily.    Marland Kitchen OVER THE COUNTER MEDICATION Take 1 capsule by mouth daily. Tumeric    . Probiotic Product (PROBIOTIC-10 PO) Take 1 tablet by mouth daily.    Marland Kitchen tiotropium (SPIRIVA HANDIHALER) 18 MCG inhalation capsule INHALE THE CONTENTS OF ONE CAPSULE DAILY 90 capsule 3   . venlafaxine XR (EFFEXOR XR) 150 MG 24 hr capsule Take 1 capsule every other day (the alternating with the 75 mg cap) 90 capsule 0  . venlafaxine XR (EFFEXOR XR) 75 MG 24 hr capsule Take the 75 mg tablet one day, then the 150 mg the next, then continue to alternate doses every other day 90 capsule 0   No current facility-administered medications for this visit.     Allergies  Allergen Reactions  . Beclomethasone Dipropionate Hives and Other (See Comments)     weight gain  . Budesonide-Formoterol Fumarate Hives  . Mometasone Furo-Formoterol Fum Hives and Other (See Comments)    weight gain  . Sulfonamide Derivatives Hives and Rash  . Pulmicort [Budesonide]     Headaches   . Statins     Myalgias, RLS    Past Medical History:  Diagnosis Date  . Allergic bronchopulmonary aspergillosis ([place]) 2008   sees Dr Edmund Hilda pulmonology  . Anemia    iron deficiency, resolved  . Anxiety   . Asthma   . CAD (coronary artery disease)    a. LHC 6/16:  oOM1 60, pRCA 25 >> med Rx  . COPD (chronic obstructive pulmonary disease) ([place])   . Depression    mild  . Diverticulosis   . GERD (gastroesophageal reflux disease)   . H/O hiatal hernia   . History of echocardiogram    Echo 6/16:  Mod LVH, EF 60-65%, no RWMA, Gr 1 DD, trivial MR, normal LA size.  Marland Kitchen Hyperglycemia 11/20/2015  . Hyperlipidemia   . IBS (irritable bowel syndrome)   . Maxillary sinusitis   . Normal cardiac stress test 11/2011   No evidence of ischemia or infarct. Calculated ejection fraction 72%.  . Obesity   . OSA (obstructive sleep apnea) 02/2012   has stopped using  cpap  . Osteoarthritis   . Osteoporosis   . Pneumonia 11/2011   "before 2013 I hadn't had pneumonia since I was a child" (04/13/2012)  . Pulmonary nodules   . Schatzki's ring     Height 5' 2.5" (1.588 m), weight 170 lb 12.8 oz (77.5 kg).   HLD (hyperlipidemia) Patient with elevated LDL, however has not had a cardiac event at this time, making  it unlikely that insurance would cover PCSK-9 therapy.  Patient is hesitant about trying med as she is currently working with different biologic products to treat her asthma, and they are also quite expensive.  She has been talking to her cousin in [place], a naturopath, and recently started a product called Guggal.  She would like to work on increasing oats and nuts in her diet, as well as taking the Guggal more consistently.  Will have her work on this for 3 months, then repeat labs and come in for follow up at that time.  Patient was agreeable to this.  Will consider her for PCSK-9 inhibitor therapy should the guidelines/restrictions change in the new year.     Tommy Medal PharmD CPP [facility]

## 2016-04-05 NOTE — Assessment & Plan Note (Signed)
Patient with elevated LDL, however has not had a cardiac event at this time, making it unlikely that insurance would cover PCSK-9 therapy.  Patient is hesitant about trying med as she is currently working with different biologic products to treat her asthma, and they are also quite expensive.  She has been talking to her cousin in [place], a naturopath, and recently started a product called Guggal.  She would like to work on increasing oats and nuts in her diet, as well as taking the Guggal more consistently.   Will have her work on this for 3 months, then repeat labs and come in for follow up at that time.  Patient was agreeable to this.  Will consider her for PCSK-9 inhibitor therapy should the guidelines/restrictions change in the new year.

## 2016-04-06 ENCOUNTER — Other Ambulatory Visit (HOSPITAL_COMMUNITY): Payer: Self-pay | Admitting: *Deleted

## 2016-04-07 ENCOUNTER — Encounter (HOSPITAL_COMMUNITY)
Admission: RE | Admit: 2016-04-07 | Discharge: 2016-04-07 | Disposition: A | Discharge status: Home / Self Care | Payer: PPO | Source: Ambulatory Visit | Attending: Internal Medicine | Admitting: Internal Medicine

## 2016-04-07 DIAGNOSIS — J4550 Severe persistent asthma, uncomplicated: Secondary | ICD-10-CM | POA: Insufficient documentation

## 2016-04-07 MED ORDER — SODIUM CHLORIDE 0.9 % IV SOLN
3.0000 mg/kg | Freq: Once | INTRAVENOUS | Status: AC
  Administered 2016-04-07: 230 mg via INTRAVENOUS
  Filled 2016-04-07: qty 23

## 2016-04-07 MED ORDER — SODIUM CHLORIDE 0.9 % IV SOLN
INTRAVENOUS | Status: DC
  Administered 2016-04-07: 13:00:00 via INTRAVENOUS

## 2016-04-12 ENCOUNTER — Encounter: Payer: Self-pay | Admitting: Internal Medicine

## 2016-04-12 ENCOUNTER — Ambulatory Visit (INDEPENDENT_AMBULATORY_CARE_PROVIDER_SITE_OTHER): Payer: PPO | Admitting: Internal Medicine

## 2016-04-12 VITALS — BP 122/80 | HR 101 | Ht 62.5 in | Wt 172.0 lb

## 2016-04-12 DIAGNOSIS — Z7952 Long term (current) use of systemic steroids: Secondary | ICD-10-CM | POA: Diagnosis not present

## 2016-04-12 NOTE — Patient Instructions (Signed)
Please come back for a cosyntropin stimulation test - fasting, between 8-9 am.  We will schedule another appt if the results are abnormal.

## 2016-04-12 NOTE — Progress Notes (Signed)
Patient ID: Victoria [name], female   DOB: 1955-10-14, 60 y.o.   MRN: [id number]    HPI  Victoria [name] is a 60 y.o.-year-old female, referred by her PCP, [name], DO, for evaluation for suspicion for steroid-induced adrenal insufficiency due to long-term treatment with corticosteroids.  She has a life-long h/o asthma >> was on many courses of Prednisone >> 7 so far this year. Last course finished 03/04/2016. She has been on chronic Prednisone tx as a child, not recently. She started now on Cynqair once a month infusions (new tx) for severe asthma >> she feels better.  She may also have ABPA.  No h/o Depo-provera, Megace, po ketoconazole, phenytoin, rifampin, chronic fluconazole use. No h/o autoimmune diseases in pt or family mbs. No excess use of NSAIDs. No h/o generalized infections or HIV. No IVDA. No h/o head injury or severe HA. No h/o malignancy.  She had an incidence of high BP (99991111 systolic) after a presentation at work. It also happened in another stress situation, when her father was sick. Otherwise, no hypertension or hypotension.  Pt mentions: - + weight gain - + fatigue - + anxiety and depression - no nausea - no vomiting - no abdominal pain - no mm aches - no palpitations - no HAs now, but had h/o HAs when younger - no dizziness - no syncopal episodes  No h/o hyponatremia or hyperkalemia.   Chemistry      Component Value Date/Time   NA 140 11/20/2015 1231   K 3.9 11/20/2015 1231   CL 102 11/20/2015 1231   CO2 28 11/20/2015 1231   BUN 14 11/20/2015 1231   CREATININE 0.71 11/20/2015 1231      Component Value Date/Time   CALCIUM 9.8 11/20/2015 1231   ALKPHOS 92 11/20/2015 1231   AST 18 11/20/2015 1231   ALT 16 11/20/2015 1231   BILITOT 0.5 11/20/2015 1231      Recent HbA1c was normal: Lab Results  Component Value Date   HGBA1C 5.5 11/20/2015   ROS: Constitutional: + See history of present illness Eyes: no blurry vision, no xerophthalmia ENT: no  sore throat, no nodules palpated in throat, no dysphagia/odynophagia, no hoarseness Cardiovascular: no CP/palpitations/+ leg swelling Respiratory: + cough/+ SOB/+ wheezing Gastrointestinal: no N/V/D/C Musculoskeletal: no muscle/joint aches Skin: no rashes, + easy bruising Neurological: no tremors/numbness/tingling/dizziness Psychiatric: + Both: depression/anxiety  Past Medical History:  Diagnosis Date  . 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 Anemia    iron deficiency, resolved  . Anxiety   . Asthma   . CAD (coronary artery disease)    a. LHC 6/16:  oOM1 60, pRCA 25 >> med Rx  . COPD (chronic obstructive pulmonary disease) ([place])   . Depression    mild  . Diverticulosis   . GERD (gastroesophageal reflux disease)   . H/O hiatal hernia   . History of echocardiogram    Echo 6/16:  Mod LVH, EF 60-65%, no RWMA, Gr 1 DD, trivial MR, normal LA size.  Marland Kitchen Hyperglycemia 11/20/2015  . Hyperlipidemia   . IBS (irritable bowel syndrome)   . Maxillary sinusitis   . Normal cardiac stress test 11/2011   No evidence of ischemia or infarct. Calculated ejection fraction 72%.  . Obesity   . OSA (obstructive sleep apnea) 02/2012   has stopped using  cpap  . Osteoarthritis   . Osteoporosis   . Pneumonia 11/2011   "before 2013 I hadn't had pneumonia since I was  a child" (04/13/2012)  . Pulmonary nodules   . Schatzki's ring    Past Surgical History:  Procedure Laterality Date  . APPENDECTOMY  1989  . CARDIAC CATHETERIZATION N/A 11/25/2014   Procedure: Right/Left Heart Cath and Coronary Angiography;  Surgeon: Belva Crome, MD;  Location: [facility];  Service: Cardiovascular;  Laterality: N/A;  . CESAREAN SECTION  1985  . HERNIA REPAIR  04/13/2012   VHR laparoscopic  . VENTRAL HERNIA REPAIR  04/13/2012   Procedure: LAPAROSCOPIC VENTRAL HERNIA;  Surgeon: Adin Hector, MD;  Location: [facility];  Service: General;  Laterality: N/A;   laparoscopic repair of incarcerated hernia   Social History   Social History  . Marital status: Married    Spouse name: N/A  . Number of children: 1  . Years of education: N/A   Occupational History  .  Unemployed   Social History Main Topics  . Smoking status: Never Smoker  . Smokeless tobacco: Never Used  . Alcohol use 2.4 oz/week    4 Glasses of wine per week     Comment: 04/13/2012 "beer in the summer; wine 3-4 times/wk"  . Drug use: No  . Sexual activity: Yes     Comment: gluten free, lives with husband and son with CP quadriplegia   Social History Narrative   Cares for a 72yo son with cerebral palsy, Quadriplegic    Current Outpatient Prescriptions on File Prior to Visit  Medication Sig Dispense Refill  . aspirin 81 MG tablet Take 81 mg by mouth daily.     . Cholecalciferol (VITAMIN D) 2000 UNITS CAPS Take 2,000 Units by mouth daily.     . Coenzyme Q10 (CO Q 10 PO) Take 200 mg by mouth 2 (two) times daily.    Marland Kitchen dextromethorphan-guaiFENesin (MUCINEX DM) 30-600 MG 12hr tablet Take 1 tablet by mouth 3 (three) times daily.    . fexofenadine (ALLEGRA) 180 MG tablet Take 180 mg by mouth daily as needed for allergies.     . fluticasone (FLONASE) 50 MCG/ACT nasal spray Place 2 sprays into both nostrils daily. 16 g 1  . Fluticasone-Salmeterol (AIRDUO RESPICLICK [id number]) [id number] MCG/ACT AEPB Inhale 1 puff into the lungs 2 (two) times daily. 1 each 0  . HYDROcodone-homatropine (HYCODAN) 5-1.5 MG/5ML syrup Take 5 mLs by mouth every 8 (eight) hours as needed for cough. 120 mL 0  . ibuprofen (ADVIL,MOTRIN) 200 MG tablet Take 200 mg by mouth every 6 (six) hours as needed for mild pain or moderate pain.    Marland Kitchen ipratropium-albuterol (DUONEB) 0.5-2.5 (3) MG/3ML SOLN INHALE THE CONTENTS OF 1 VIAL VIA NEBULIZER UP TO 4 TO 6 TIMES A DAY. 360 mL 2  . montelukast (SINGULAIR) 10 MG tablet TAKE 1 TABLET BY MOUTH once daily 90 tablet 1  . mupirocin nasal ointment (BACTROBAN) 2 % Place 1 application  into the nose 2 (two) times daily. Use one-half of tube in each nostril twice daily for five (5) days. After application, press sides of nose together and gently massage.    . Omega-3 Fat Ac-Cholecalciferol (OMEGA-3 + VITAMIN D3 ULTRA STR) LIQD Take 1 Units by mouth daily. 1 teaspoon daily- 1600mg  omega 3    . Omeprazole-Sodium Bicarbonate (ZEGERID OTC PO) Take 1 tablet by mouth daily.    Marland Kitchen OVER THE COUNTER MEDICATION Take 1 capsule by mouth daily. Tumeric    . Probiotic Product (PROBIOTIC-10 PO) Take 1 tablet by mouth daily.    Marland Kitchen tiotropium (SPIRIVA HANDIHALER) 18 MCG inhalation  capsule INHALE THE CONTENTS OF ONE CAPSULE DAILY 90 capsule 3  . venlafaxine XR (EFFEXOR XR) 150 MG 24 hr capsule Take 1 capsule every other day (the alternating with the 75 mg cap) 90 capsule 0  . venlafaxine XR (EFFEXOR XR) 75 MG 24 hr capsule Take the 75 mg tablet one day, then the 150 mg the next, then continue to alternate doses every other day 90 capsule 0  . ALPRAZolam (XANAX) 0.5 MG tablet Take 1 tablet (0.5 mg total) by mouth 2 (two) times daily as needed for anxiety (Palpatations, SOB). (Patient not taking: Reported on 04/12/2016) 30 tablet 1  . furosemide (LASIX) 20 MG tablet Take 1 tablet (20 mg total) by mouth daily as needed. (Patient not taking: Reported on 04/12/2016) 30 tablet 1  . Melatonin 1 MG SUBL Take it nightly (Patient not taking: Reported on 04/12/2016) 30 tablet 0  . nitroGLYCERIN (NITROSTAT) 0.4 MG SL tablet Place 1 tablet (0.4 mg total) under the tongue every 5 (five) minutes as needed for chest pain. (Patient not taking: Reported on 04/12/2016) 25 tablet 0   No current facility-administered medications on file prior to visit.    Allergies  Allergen Reactions  . Beclomethasone Dipropionate Hives and Other (See Comments)     weight gain  . Budesonide-Formoterol Fumarate Hives  . Mometasone Furo-Formoterol Fum Hives and Other (See Comments)    weight gain  . Sulfonamide Derivatives Hives and  Rash  . Pulmicort [Budesonide]     Headaches   . Statins     Myalgias, RLS   Family History  Problem Relation Age of Onset  . Breast cancer Mother   . Hypertension Mother   . Diabetes Mother   . Diverticulosis Father   . Prostate cancer Father     prostate  . Pulmonary embolism Brother     recurrent  . Heart attack Maternal Grandfather   . Breast cancer Sister     breast  . Stroke Neg Hx    PE: BP 122/80 (BP Location: Left Arm, Patient Position: Sitting)   Pulse (!) 101   Ht 5' 2.5" (1.588 m)   Wt 172 lb (78 kg)   SpO2 97%   BMI 30.96 kg/m   Wt Readings from Last 3 Encounters:  04/12/16 172 lb (78 kg)  04/07/16 170 lb (77.1 kg)  04/05/16 170 lb 12.8 oz (77.5 kg)   Constitutional: overweight, in NAD, + full supraclavicular fat pads, + [place] hump, + central distribution of adipose, + dark discoloration on sideburns   Eyes: PERRLA, EOMI, no exophthalmos ENT: moist mucous membranes, no thyromegaly, no cervical lymphadenopathy Cardiovascular: RRR, No MRG Respiratory: CTA B Gastrointestinal: abdomen soft, NT, ND, BS+ Musculoskeletal: no deformities, strength intact in all 4 Skin: moist, warm, no rashes Neurological: no tremor with outstretched hands, DTR normal in all 4  ASSESSMENT: 1. Long-term steroid use - Cushingoid phenotype - ? Adrenal insufficiency (AI)   PLAN:  1.  - Patient requested this referral from PCP since she has been off and on corticosteroids for her entire life for severe asthma. She does have fatigue and central distribution of adiposity, consistent with Cushing phenotype. Otherwise, she does not describe nausea, vomiting, dizziness, proximal muscle weakness, headaches, low blood pressure.  - we discussed that central (tertiary) adrenal insufficiency is definitely a concern with long-term use of steroids, however, the fact that she used to steroids in an intermittent fashion may have protected her from the development of permanent adrenal  insufficiency. She is  now on a new therapy for asthma and hopefully this will decrease the need to use steroids in the future.  - we discussed that the diagnosis test for AI is a cosyntropin stimulation test. I explained what this consists of. We will plan to check this at 8 am >> will return for this. I advised her not to use any steroid products around the time of the test. - if pt turns out to have AI, we  may need to start her on steroid replacement, this is usually done with Hydrocortisone, usually a bid dose is likely best >> 10 mg in am and 5 mg in pm, best ~3 pm. However, if she does have some cortisol production from the adrenals, we may need to use a lower dose of hydrocortisone  -  I will await the results of the stimulation test and will schedule another appointment if this is abnormal  Orders Placed This Encounter  Procedures  . Cortisol  . ACTH  . Cortisol  . Cortisol   04/20/16 Addendum: Pt did not come for labs yet. I will addend the results when they become available.  Philemon Kingdom, MD PhD [facility]

## 2016-04-18 ENCOUNTER — Other Ambulatory Visit: Payer: PPO

## 2016-04-21 ENCOUNTER — Ambulatory Visit: Payer: PPO | Admitting: Family Medicine

## 2016-04-21 ENCOUNTER — Telehealth: Payer: Self-pay | Admitting: Family Medicine

## 2016-04-21 NOTE — Telephone Encounter (Signed)
Patient is requesting a refill of venlafaxine 75mg , patient has to R/S appt and was going to request it in person. She has been taking the 75 mg daily as well as a 37.5mg  venlafaxine daily (old prescription).

## 2016-04-22 MED ORDER — VENLAFAXINE HCL ER 75 MG PO CP24: ORAL_CAPSULE | ORAL | 0 refills | Status: DC

## 2016-04-22 MED ORDER — VENLAFAXINE HCL ER 150 MG PO CP24
ORAL_CAPSULE | ORAL | 0 refills | Status: DC
Start: 2016-04-22 — End: 2016-05-03

## 2016-04-22 NOTE — Addendum Note (Signed)
Addended by: Fonnie Mu on: 04/22/2016 09:29 AM   Modules accepted: Orders

## 2016-04-26 ENCOUNTER — Encounter: Payer: Self-pay | Admitting: Internal Medicine

## 2016-04-26 NOTE — Telephone Encounter (Signed)
MR ,  Please note the following reactions/side effects patient had and advise. Thanks.

## 2016-04-27 ENCOUNTER — Ambulatory Visit: Payer: PPO | Admitting: Family Medicine

## 2016-05-03 ENCOUNTER — Ambulatory Visit (INDEPENDENT_AMBULATORY_CARE_PROVIDER_SITE_OTHER): Payer: PPO | Admitting: Family Medicine

## 2016-05-03 ENCOUNTER — Encounter: Payer: Self-pay | Admitting: Family Medicine

## 2016-05-03 VITALS — BP 107/71 | HR 80 | Wt 175.3 lb

## 2016-05-03 DIAGNOSIS — Z23 Encounter for immunization: Secondary | ICD-10-CM | POA: Diagnosis not present

## 2016-05-03 DIAGNOSIS — Z6830 Body mass index (BMI) 30.0-30.9, adult: Secondary | ICD-10-CM

## 2016-05-03 DIAGNOSIS — F411 Generalized anxiety disorder: Secondary | ICD-10-CM

## 2016-05-03 DIAGNOSIS — M546 Pain in thoracic spine: Secondary | ICD-10-CM

## 2016-05-03 DIAGNOSIS — G479 Sleep disorder, unspecified: Secondary | ICD-10-CM

## 2016-05-03 DIAGNOSIS — F32A Depression, unspecified: Secondary | ICD-10-CM

## 2016-05-03 DIAGNOSIS — F329 Major depressive disorder, single episode, unspecified: Secondary | ICD-10-CM

## 2016-05-03 DIAGNOSIS — M4120 Other idiopathic scoliosis, site unspecified: Secondary | ICD-10-CM

## 2016-05-03 DIAGNOSIS — M62838 Other muscle spasm: Secondary | ICD-10-CM

## 2016-05-03 DIAGNOSIS — Z789 Other specified health status: Secondary | ICD-10-CM | POA: Insufficient documentation

## 2016-05-03 DIAGNOSIS — G8929 Other chronic pain: Secondary | ICD-10-CM | POA: Insufficient documentation

## 2016-05-03 MED ORDER — VENLAFAXINE HCL ER 37.5 MG PO CP24: ORAL_CAPSULE | ORAL | 1 refills | Status: DC

## 2016-05-03 MED ORDER — CYCLOBENZAPRINE HCL 10 MG PO TABS: 10.0000 mg | ORAL_TABLET | Freq: Three times a day (TID) | ORAL | 0 refills | Status: DC | PRN

## 2016-05-03 MED ORDER — VENLAFAXINE HCL ER 75 MG PO CP24: ORAL_CAPSULE | ORAL | 1 refills | Status: DC

## 2016-05-03 NOTE — Assessment & Plan Note (Addendum)
Please see the handouts I gave you regarding weight loss  I highly recommended [employer] to patient; track all her foods using lose it or my fitness pal  She declined dietary consultation.

## 2016-05-03 NOTE — Patient Instructions (Addendum)
Due to the ice Massages for 15-20 minutes 3-4 times a day. You can also follow this with a heating pad which will increase blood flow and bring good oxygenated blood to the muscle to help heal. Stretching is good, no heavy lifting. Also walking on flat ground can help increase blood flow to those muscles and so that will help as well.  TENS UNIT: This is helpful for muscle pain and spasm.   Search and Purchase a TENS 7000 2nd edition at [email or web address]. It should be less than $30.     TENS unit instructions: Do not shower or bathe with the unit on Turn the unit off before removing electrodes or batteries If the electrodes lose stickiness add a drop of water to the electrodes after they are disconnected from the unit and place on plastic sheet. If you continued to have difficulty, call the TENS unit company to purchase more electrodes. Do not apply lotion on the skin area prior to use. Make sure the skin is clean and dry as this will help prolong the life of the electrodes. After use, always check skin for unusual red areas, rash or other skin difficulties. If there are any skin problems, does not apply electrodes to the same area. Never remove the electrodes from the unit by pulling the wires. Do not use the TENS unit or electrodes other than as directed. Do not change electrode placement without consultating your therapist or physician. Keep 2 fingers with between each electrode. Wear time ratio is 2:1, on to off times.    For example on for 30 minutes off for 15 minutes and then on for 30 minutes off for 15 minutes      ---> use Lose It or My Fitness Pal   Guidelines for Losing Weight   We want weight loss that will last so you should lose 1-2 pounds a week.  THAT IS IT! Please pick THREE things a month to change. Once it is a habit check off the item. Then pick another three items off the list to become habits.  If you are already doing a habit on the list GREAT!  Cross that item  off!  Don't drink your calories. Ie, alcohol, soda, fruit juice, and sweet tea.   Drink more water. Drink a glass when you feel hungry or before each meal.   Eat breakfast - Complex carb and protein (likeDannon light and fit yogurt, oatmeal, fruit, eggs, [place] bacon).  Measure your cereal.  Eat no more than one cup a day. (ie Kashi)  Eat an apple a day.  Add a vegetable a day.  Try a new vegetable a month.  Use Pam! Stop using oil or butter to cook.  Don't finish your plate or use smaller plates.  Share your dessert.  Eat sugar free Jello for dessert or frozen grapes.  Don't eat 2-3 hours before bed.  Switch to whole wheat bread, pasta, and brown rice.  Make healthier choices when you eat out. No fries!  Pick baked chicken, NOT fried.  Don't forget to SLOW DOWN when you eat. It is not going anywhere.   Take the stairs.  Park far away in the parking lot  Lift soup cans (or weights) for 10 minutes while watching TV.  Walk at work for 10 minutes during break.  Walk outside 1 time a week with your friend, kids, dog, or significant other.  Start a walking group at church.  Walk the mall as much as you can  tolerate.   Keep a food diary.  Weigh yourself daily.  Walk for 15 minutes 3 days per week.  Cook at home more often and eat out less. If life happens and you go back to old habits, it is okay.  Just start over. You can do it!  If you experience chest pain, get short of breath, or tired during the exercise, please stop immediately and inform your doctor.    Before you even begin to attack a weight-loss plan, it pays to remember this: You are not fat. You have fat. Losing weight isn't about blame or shame; it's simply another achievement to accomplish. Dieting is like any other skill-you have to buckle down and work at it. As long as you act in a smart, reasonable way, you'll ultimately get where you want to be. Here are some weight loss pearls for you.   1.  It's Not a Diet. It's a Lifestyle Thinking of a diet as something you're on and suffering through only for the short term doesn't work. To shed weight and keep it off, you need to make permanent changes to the way you eat. It's OK to indulge occasionally, of course, but if you cut calories temporarily and then revert to your old way of eating, you'll gain back the weight quicker than you can say yo-yo. Use it to lose it. Research shows that one of the best predictors of long-term weight loss is how many pounds you drop in the first month. For that reason, nutritionists often suggest being stricter for the first two weeks of your new eating strategy to build momentum. Cut out added sugar and alcohol and avoid unrefined carbs. After that, figure out how you can reincorporate them in a way that's healthy and maintainable.  2. There's a Right Way to Exercise Working out burns calories and fat and boosts your metabolism by building muscle. But those trying to lose weight are notorious for overestimating the number of calories they burn and underestimating the amount they take in. Unfortunately, your system is biologically programmed to hold on to extra pounds and that means when you start exercising, your body senses the deficit and ramps up its hunger signals. If you're not diligent, you'll eat everything you burn and then some. Use it, to lose it. Cardio gets all the exercise glory, but strength and interval training are the real heroes. They help you build lean muscle, which in turn increases your metabolism and calorie-burning ability 3. Don't Overreact to Mild Hunger Some people have a hard time losing weight because of hunger anxiety. To them, being hungry is bad-something to be avoided at all costs-so they carry snacks with them and eat when they don't need to. Others eat because they're stressed out or bored. While you never want to get to the point of being ravenous (that's when bingeing is likely to  happen), a hunger pang, a craving, or the fact that it's 3:00 p.m. should not send you racing for the vending machine or obsessing about the energy bar in your purse. Ideally, you should put off eating until your stomach is growling and it's difficult to concentrate.  Use it to lose it. When you feel the urge to eat, use the HALT method. Ask yourself, Am I really hungry? Or am I angry or anxious, lonely or bored, or tired? If you're still not certain, try the apple test. If you're truly hungry, an apple should seem delicious; if it doesn't, something else is going  on. Or you can try drinking water and making yourself busy, if you are still hungry try a healthy snack.  4. Not All Calories Are Created Equal The mechanics of weight loss are pretty simple: Take in fewer calories than you use for energy. But the kind of food you eat makes all the difference. Processed food that's high in saturated fat and refined starch or sugar can cause inflammation that disrupts the hormone signals that tell your brain you're full. The result: You eat a lot more.  Use it to lose it. Clean up your diet. Swap in whole, unprocessed foods, including vegetables, lean protein, and healthy fats that will fill you up and give you the biggest nutritional bang for your calorie buck. In a few weeks, as your brain starts receiving regular hunger and fullness signals once again, you'll notice that you feel less hungry overall and naturally start cutting back on the amount you eat.  5. Protein, Produce, and Plant-Based Fats Are Your Weight-Loss Trinity Here's why eating the three Ps regularly will help you drop pounds. Protein fills you up. You need it to build lean muscle, which keeps your metabolism humming so that you can torch more fat. People in a weight-loss program who ate double the recommended daily allowance for protein (about 110 grams for a 150-pound woman) lost 70 percent of their weight from fat, while people who ate the RDA  lost only about 40 percent, one study found. Produce is packed with filling fiber. "It's very difficult to consume too many calories if you're eating a lot of vegetables. Example: Three cups of broccoli is a lot of food, yet only 93 calories. (Fruit is another story. It can be easy to overeat and can contain a lot of calories from sugar, so be sure to monitor your intake.) Plant-based fats like olive oil and those in avocados and nuts are healthy and extra satiating.  Use it to lose it. Aim to incorporate each of the three Ps into every meal and snack. People who eat protein throughout the day are able to keep weight off, according to a study in the [place] of Clinical Nutrition. In addition to meat, poultry and seafood, good sources are beans, lentils, eggs, tofu, and yogurt. As for fat, keep portion sizes in check by measuring out salad dressing, oil, and nut butters (shoot for one to two tablespoons). Finally, eat veggies or a little fruit at every meal. People who did that consumed 308 fewer calories but didn't feel any hungrier than when they didn't eat more produce.  7. How You Eat Is As Important As What You Eat In order for your brain to register that you're full, you need to focus on what you're eating. Sit down whenever you eat, preferably at a table. Turn off the TV or computer, put down your phone, and look at your food. Smell it. Chew slowly, and don't put another bite on your fork until you swallow. When women ate lunch this attentively, they consumed 30 percent less when snacking later than those who listened to an audiobook at lunchtime, according to a study in the [place] of Nutrition. 8. Weighing Yourself Really Works The scale provides the best evidence about whether your efforts are paying off. Seeing the numbers tick up or down or stagnate is motivation to keep going-or to rethink your approach. A 2015 study at [facility] found that daily weigh-ins helped people  lose more weight, keep it off, and maintain that  loss, even after two years. Use it to lose it. Step on the scale at the same time every day for the best results. If your weight shoots up several pounds from one weigh-in to the next, don't freak out. Eating a lot of salt the night before or having your period is the likely culprit. The number should return to normal in a day or two. It's a steady climb that you need to do something about. 9. Too Much Stress and Too Little Sleep Are Your Enemies When you're tired and frazzled, your body cranks up the production of cortisol, the stress hormone that can cause carb cravings. Not getting enough sleep also boosts your levels of ghrelin, a hormone associated with hunger, while suppressing leptin, a hormone that signals fullness and satiety. People on a diet who slept only five and a half hours a night for two weeks lost 55 percent less fat and were hungrier than those who slept eight and a half hours, according to a study in the [place]. Use it to lose it. Prioritize sleep, aiming for seven hours or more a night, which research shows helps lower stress. And make sure you're getting quality zzz's. If a snoring spouse or a fidgety cat wakes you up frequently throughout the night, you may end up getting the equivalent of just four hours of sleep, according to a study from [facility]. Keep pets out of the bedroom, and use a white-noise app to drown out snoring. 10. You Will Hit a plateau-And You Can Bust Through It As you slim down, your body releases much less leptin, the fullness hormone.  If you're not strength training, start right now. Building muscle can raise your metabolism to help you overcome a plateau. To keep your body challenged and burning calories, incorporate new moves and more intense intervals into your workouts or add another sweat session to your weekly routine. Alternatively, cut an extra 100 calories or so a day  from your diet. Now that you've lost weight, your body simply doesn't need as much fuel.      Since food equals calories, in order to lose weight you must either eat fewer calories, exercise more to burn off calories with activity, or both. Food that is not used to fuel the body is stored as fat. A major component of losing weight is to make smarter food choices. Here's how:  1)   Limit non-nutritious foods, such as: Sugar, honey, syrups and candy Pastries, donuts, pies, cakes and cookies Soft drinks, sweetened juices and alcoholic beverages  2)  Cut down on high-fat foods by: - Choosing poultry, fish or lean red meat - Choosing low-fat cooking methods, such as baking, broiling, steaming, grilling and boiling - Using low-fat or non-fat dairy products - Using vinaigrette, herbs, lemon or fat-free salad dressings - Avoiding fatty meats, such as bacon, sausage, franks, ribs and luncheon meats - Avoiding high-fat snacks like nuts, chips and chocolate - Avoiding fried foods - Using less butter, margarine, oil and mayonnaise - Avoiding high-fat gravies, cream sauces and cream-based soups  3) Eat a variety of foods, including: - Fruit and vegetables that are raw, steamed or baked - Whole grains, breads, cereal, rice and pasta - Dairy products, such as low-fat or non-fat milk or yogurt, low-fat cottage cheese and low-fat cheese - Protein-rich foods like chicken, [place], fish, lean meat and legumes, or beans  4) Change your eating habits by: - Eat three balanced meals a day  to help control your hunger - Watch portion sizes and eat small servings of a variety of foods - Choose low-calorie snacks - Eat only when you are hungry and stop when you are satisfied - Eat slowly and try not to perform other tasks while eating - Find other activities to distract you from food, such as walking, taking up a hobby or being involved in the community - Include regular exercise in your daily routine (  minimum of 20 min of moderate-intensity exercise at least 5 days/week)  - Find a support group, if necessary, for emotional support in your weight loss journey      Easy ways to cut 100 calories  1. Eat your eggs with hot sauce OR salsa instead of cheese.  Eggs are great for breakfast, but many people consider eggs and cheese to be BFFs. Instead of cheese-1 oz. of cheddar has 114 calories-top your eggs with hot sauce, which contains no calories and helps with satiety and metabolism. Salsa is also a great option!!  2. Top your toast, waffles or pancakes with fresh berries instead of jelly or syrup. Half a cup of berries-fresh, frozen or thawed-has about 40 calories, compared with 2 tbsp. of maple syrup or jelly, which both have about 100 calories. The berries will also give you a good punch of fiber, which helps keep you full and satisfied and won't spike blood sugar quickly like the jelly or syrup. 3. Swap the non-fat latte for black coffee with a splash of half-and-half. Contrary to its name, that non-fat latte has 130 calories and a startling 19g of carbohydrates per 16 oz. serving. Replacing that 'light' drinkable dessert with a black coffee with a splash of half-and-half saves you more than 100 calories per 16 oz. serving. 4. Sprinkle salads with freeze-dried raspberries instead of dried cranberries. If you want a sweet addition to your nutritious salad, stay away from dried cranberries. They have a whopping 130 calories per  cup and 30g carbohydrates. Instead, sprinkle freeze-dried raspberries guilt-free and save more than 100 calories per  cup serving, adding 3g of belly-filling fiber. 5. Go for mustard in place of mayo on your sandwich. Mustard can add really nice flavor to any sandwich, and there are tons of varieties, from spicy to honey. A serving of mayo is 95 calories, versus 10 calories in a serving of mustard.  Or try an avocado mayo spread: You can find the recipe few click this  link: https://www.californiaavocado.com/recipes/recipe-container/[place]-avocado-mayo 6. Choose a DIY salad dressing instead of the store-bought kind. Mix Dijon or whole grain mustard with low-fat Kefir or red wine vinegar and garlic. 7. Use hummus as a spread instead of a dip. Use hummus as a spread on a high-fiber cracker or tortilla with a sandwich and save on calories without sacrificing taste. 8. Pick just one salad "accessory." Salad isn't automatically a calorie winner. It's easy to over-accessorize with toppings. Instead of topping your salad with nuts, avocado and cranberries (all three will clock in at 313 calories), just pick one. The next day, choose a different accessory, which will also keep your salad interesting. You don't wear all your jewelry every day, right? 9. Ditch the white pasta in favor of spaghetti squash. One cup of cooked spaghetti squash has about 40 calories, compared with traditional spaghetti, which comes with more than 200. Spaghetti squash is also nutrient-dense. It's a good source of fiber and Vitamins A and C, and it can be eaten just like you would eat pasta-with  a great tomato sauce and [place] meatballs or with pesto, tofu and spinach, for example. 10. Dress up your chili, soups and stews with non-fat [place] yogurt instead of sour cream. Just a 'dollop' of sour cream can set you back 115 calories and a whopping 12g of fat-seven of which are of the artery-clogging variety. Added bonus: [place] yogurt is packed with muscle-building protein, calcium and B Vitamins. 11. Mash cauliflower instead of mashed potatoes. One cup of traditional mashed potatoes-in all their creamy goodness-has more than 200 calories, compared to mashed cauliflower, which you can typically eat for less than 100 calories per 1 cup serving. Cauliflower is a great source of the antioxidant indole-3-carbinol (I3C), which may help reduce the risk of some cancers, like breast cancer. 12. Ditch the ice  cream sundae in favor of a [place] yogurt parfait. Instead of a cup of ice cream or fro-yo for dessert, try 1 cup of nonfat Greek yogurt topped with fresh berries and a sprinkle of cacao nibs. Both toppings are packed with antioxidants, which can help reduce cellular inflammation and oxidative damage. And the comparison is a no-brainer: One cup of ice cream has about 275 calories; one cup of frozen yogurt has about 230; and a cup of Greek yogurt has just 130, plus twice the protein, so you're less likely to return to the freezer for a second helping. 13. Put olive oil in a spray container instead of using it directly from the bottle. Each tablespoon of olive oil is 120 calories and 15g of fat. Use a mister instead of pouring it straight into the pan or onto a salad. This allows for portion control and will save you more than 100 calories. 14. When baking, substitute canned pumpkin for butter or oil. Canned pumpkin-not pumpkin pie mix-is loaded with Vitamin A, which is important for skin and eye health, as well as immunity. And the comparisons are pretty crazy:  cup of canned pumpkin has about 40 calories, compared to butter or oil, which has more than 800 calories. Yes, 800 calories. Applesauce and mashed banana can also serve as good substitutions for butter or oil, usually in a 1:1 ratio. 15. Top casseroles with high-fiber cereal instead of breadcrumbs. Breadcrumbs are typically made with white bread, while breakfast cereals contain 5-9g of fiber per serving. Not only will you save more than 150 calories per  cup serving, the swap will also keep you more full and you'll get a metabolism boost from the added fiber. 16. Snack on pistachios instead of macadamia nuts. Believe it or not, you get the same amount of calories from 35 pistachios (100 calories) as you would from only five macadamia nuts. 17. Chow down on kale chips rather than potato chips. This is my favorite 'don't knock it 'till you try it'  swap. Kale chips are so easy to make at home, and you can spice them up with a little grated parmesan or chili powder. Plus, they're a mere fraction of the calories of potato chips, but with the same crunch factor we crave so often. 18. Add seltzer and some fruit slices to your cocktail instead of soda or fruit juice. One cup of soda or fruit juice can pack on as much as 140 calories. Instead, use seltzer and fruit slices. The fruit provides valuable phytochemicals, such as flavonoids and anthocyanins, which help to combat cancer and stave off the aging process.

## 2016-05-03 NOTE — Assessment & Plan Note (Signed)
Flexeril when necessary at night.  Aware of potential hypnotic-sedated effects and potential side effects of medicine. She understands this will not fix the problem but will just aid in symptom relief.

## 2016-05-03 NOTE — Assessment & Plan Note (Addendum)
New prescriptions given of Effexor. Patient will take 112.5 mg daily of her Effexor. She will let me know she's having problems prior prior to follow-up in 2 months.  Advise CBT counseling. Patient will call for appointment.

## 2016-05-03 NOTE — Assessment & Plan Note (Addendum)
Continue melatonin-may increase up to 10 mg nightly.  Try to exercise more during the day and drink more water- this will help your leg / feet cramping.   -May consider Elavil in the future or Neurontin since patient has restless leg like symptoms as well as difficulty with sleeping

## 2016-05-03 NOTE — Assessment & Plan Note (Signed)
Xanax- she uses it rarely- 1-2 / month

## 2016-05-03 NOTE — Progress Notes (Signed)
Impression and Recommendations:    1. Depression, unspecified depression type   2. Anxiety state   3. BMI 30.0-30.9,adult   4. Need for prophylactic vaccination and inoculation against influenza   5. Muscle spasm   6. Sleep disorder due to stress   7. SCOLIOSIS   8. Chronic bilateral thoracic back pain   9. Intolerance, drug:  statins\cholesterol medications     Depression New prescriptions given of Effexor. Patient will take 112.5 mg daily of her Effexor. She will let me know she's having problems prior prior to follow-up in 2 months.  Advise CBT counseling. Patient will call for appointment.  Sleep disorder due to stress Continue melatonin-may increase up to 10 mg nightly.  Try to exercise more during the day and drink more water- this will help your leg / feet cramping.   -May consider Elavil in the future or Neurontin since patient has restless leg like symptoms as well as difficulty with sleeping  BMI 30.0-30.9,adult Please see the handouts I gave you regarding weight loss  I highly recommended Weight Watchers to patient; track all her foods using lose it or my fitness pal  She declined dietary consultation.  Anxiety state Xanax- she uses it rarely- 1-2 / month  Muscle spasm Flexeril when necessary at night.  Aware of potential hypnotic-sedated effects and potential side effects of medicine. She understands this will not fix the problem but will just aid in symptom relief.  Chronic bilateral thoracic back pain Advised physical therapy- which patient declined.  Weight loss, and improving core strength is important   New Prescriptions   CYCLOBENZAPRINE (FLEXERIL) 10 MG TABLET    Take 1 tablet (10 mg total) by mouth 3 (three) times daily as needed for muscle spasms.   VENLAFAXINE XR (EFFEXOR XR) 37.5 MG 24 HR CAPSULE    Take daily along with the 75 mg tablet. ( 112.5mg  daily)    Modified Medications   Modified Medication Previous Medication   VENLAFAXINE XR (EFFEXOR XR) 75 MG 24 HR CAPSULE venlafaxine XR (EFFEXOR XR) 75 MG 24 hr capsule      Take this tablet along with your 37.5 mg tablet daily. ( 112.5mg  daily)    Take the 75 mg tablet one day, then the 150 mg the next, then continue to alternate doses every other day    Discontinued Medications   FLUTICASONE-SALMETEROL (AIRDUO RESPICLICK [id number]) [id number] MCG/ACT AEPB    Inhale 1 puff into the lungs 2 (two) times daily.   VENLAFAXINE XR (EFFEXOR XR) 150 MG 24 HR CAPSULE    Take 1 capsule every other day (the alternating with the 75 mg cap)    The patient was counseled, risk factors were discussed, anticipatory guidance given.  Gross side effects, risk and benefits, and alternatives of medications and treatment plan in general discussed with patient.  Patient is aware that all medications have potential side effects and we are unable to predict every side effect or drug-drug interaction that may occur.   Patient will call with any questions prior to using medication if they have concerns.  Expresses verbal understanding and consents to current therapy and treatment regimen.  No barriers to understanding were identified.  Red flag symptoms and signs discussed in detail.  Patient expressed understanding regarding what to do in case of emergency\urgent symptoms  Return in about 2 months (around 07/03/2016) for We will reassess your left back muscular pain then, and mood.  Please see AVS handed out to patient  at the end of our visit for further patient instructions/ counseling done pertaining to today's office visit.    Note: This document was prepared using Dragon voice recognition software and may include unintentional dictation  errors.   --------------------------------------------------------------------------------------------------------------------------------------------------------------------------------------------------------------------------------------------    Subjective:    CC:  Chief Complaint  Patient presents with  . Depression    HPI: Victoria [name] is a 60 y.o. female who presents to [place] at [facility] today for issues as discussed below.   I last saw patient on 8\31\17 and I started her on Effexor or. She was supposed to follow up in 4 weeks and presents today to the clinic for follow-up. She was told to take alternating doses every other day. 150 mg one day and then 75 the next. Patient has been  Taking 150mg  one day then 75 + 37.5 tabs she had at home the next day.    So now she is on just the 75 + 37.5 mg tabs. I "feel more normal."  Less emotional.  Was on 112.5mg  in past many yrs---> but she forgot to tell me last OV about this.     Using Xanax- about couple times a month--> Uses it for RLS  -  L sided paravertebral muscle spasms since doing too much this weekend- T 10-L2/3 region. No radicular symptoms, no weakness. This has occurred in the past and she went to go see her doctor at the time for this. She was going to be sent for physical therapy but did not have the time then so she never did go. She will consider this in the new year if it's still bothering her.   Wt Readings from Last 3 Encounters:  05/03/16 175 lb 4.8 oz (79.5 kg)  04/12/16 172 lb (78 kg)  04/07/16 170 lb (77.1 kg)   BP Readings from Last 3 Encounters:  05/03/16 107/71  04/12/16 122/80  04/07/16 119/79   Pulse Readings from Last 3 Encounters:  05/03/16 80  04/12/16 (!) 101  04/07/16 74   BMI Readings from Last 3 Encounters:  05/03/16 31.55 kg/m  04/12/16 30.96 kg/m  04/07/16 30.60 kg/m     Patient Care Team    Relationship Specialty Notifications Start End  [name],  DO PCP - General Family Medicine  12/01/15   Devra Dopp, MD Referring Physician Dermatology  02/18/16   Brand Males, MD Consulting Physician Pulmonary Disease  02/18/16   Dorothy Spark, MD Consulting Physician Cardiology  02/18/16   Dian Queen, MD Consulting Physician Obstetrics and Gynecology  02/18/16   Melissa Montane, MD Consulting Physician Otolaryngology  02/18/16   Jerene Bears, MD Consulting Physician Gastroenterology  02/18/16     Patient Active Problem List   Diagnosis Date Noted  . BMI 30.0-30.9,adult 02/18/2016    Priority: High  . Sleep disorder due to stress 01/12/2016    Priority: High  . Hyperglycemia 11/20/2015    Priority: High  . Anxiety state 08/18/2013    Priority: High  . HLD (hyperlipidemia) 09/11/2007    Priority: High  . Depression 12/11/2006    Priority: High  . MRSA (methicillin resistant Staphylococcus aureus) colonization 01/12/2016    Priority: Medium  . Chronic diastolic CHF (congestive heart failure), NYHA class 2 ([place]) 06/26/2015    Priority: Medium  . Seasonal and perennial allergic rhinitis 10/22/2011    Priority: Medium  . OSA (obstructive sleep apnea) 11/13/2010    Priority: Medium  . Environmental allergies 11/13/2010  Priority: Medium  . DIVERTICULOSIS, COLON 06/11/2010    Priority: Medium  . ELEVATED BP READING WITHOUT DX HYPERTENSION 08/03/2009    Priority: Medium  . Esophageal reflux 10/21/2008    Priority: Medium  . STRESS INCONTINENCE 09/11/2007    Priority: Medium  . Osteoarthritis 12/11/2006    Priority: Medium  . Disorder of bone and cartilage 12/11/2006    Priority: Medium  . Muscle spasm 05/03/2016    Priority: Low  . Chronic bilateral thoracic back pain 05/03/2016    Priority: Low  . Fatigue 09/01/2011    Priority: Low  . A B P A-ALLERGIC BRONCHOPULMONARY ASPERGILLOSIS 12/14/2007    Priority: Low  . Intolerance, drug:  statins\cholesterol medications 05/03/2016  . Severe persistent asthma without  complication Q000111Q  . Coronary artery disease due to lipid rich plaque 06/26/2015  . 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[place]) Q000111Q  . Cardiomegaly 11/21/2014  . Chest pain, atypical 06/01/2011  . UMBILICAL HERNIA [id number]  . HEPATIC CYST 06/11/2010  . SCOLIOSIS 06/11/2010  . TRANSAMINASES, SERUM, ELEVATED 05/27/2010  . h/o PALPITATIONS 08/19/2008  . CHONDROMALACIA OF PATELLA 04/15/2008  . Asthma 11/16/2007  . Cushing's syndrome ([place]) 12/11/2006    Past Medical history, Surgical history, Family history, Social history, Allergies and Medications have been entered into the medical record, reviewed and changed as needed.   Allergies:  Allergies  Allergen Reactions  . Beclomethasone Dipropionate Hives and Other (See Comments)     weight gain  . Budesonide-Formoterol Fumarate Hives  . Mometasone Furo-Formoterol Fum Hives and Other (See Comments)    weight gain  . Sulfonamide Derivatives Hives and Rash  . Pulmicort [Budesonide]     Headaches   . Statins     Myalgias, RLS    Review of Systems  Constitutional: Negative.  Negative for chills, diaphoresis, fever, malaise/fatigue and weight loss.  HENT: Negative.  Negative for congestion, sore throat and tinnitus.   Eyes: Negative.  Negative for blurred vision, double vision and photophobia.  Respiratory: Negative.  Negative for cough and wheezing.   Cardiovascular: Negative.  Negative for chest pain and palpitations.  Gastrointestinal: Negative.  Negative for blood in stool, diarrhea, nausea and vomiting.  Genitourinary: Negative.  Negative for dysuria, frequency and urgency.  Musculoskeletal: Positive for back pain. Negative for joint pain and myalgias.  Skin: Negative.  Negative for itching and rash.  Neurological: Negative.  Negative for dizziness, focal weakness, weakness and headaches.  Endo/Heme/Allergies: Negative.  Negative for environmental allergies and polydipsia. Does not bruise/bleed easily.  Psychiatric/Behavioral:  Negative.  Negative for depression and memory loss. The patient is not nervous/anxious and does not have insomnia.      Objective:   Blood pressure 107/71, pulse 80, weight 175 lb 4.8 oz (79.5 kg). Body mass index is 31.55 kg/m. General: Well Developed, well nourished, appropriate for stated age.  Neuro: Alert and oriented x3, extra-ocular muscles intact, sensation grossly intact.  HEENT: Normocephalic, atraumatic, neck supple   Skin: Warm and dry, no gross rash. Cardiac: RRR, S1 S2,  no murmurs rubs or gallops.  Respiratory: ECTA B/L, Not using accessory muscles, speaking in full sentences-unlabored. M-SK; paravertebral muscle tightness from T10-L3 on the left and laterally. Multiple triggerpoints. Left is greater than right. NVI distally; full range of motion bilateral lower extremities; 5\5 strength throughout; reflexes intact; sensory intact Vascular:  No gross lower ext edema, cap RF less 2 sec. Psych: No HI/SI, judgement and insight good, Euthymic mood. Full Affect.

## 2016-05-03 NOTE — Assessment & Plan Note (Signed)
Advised physical therapy- which patient declined.  Weight loss, and improving core strength is important

## 2016-05-04 NOTE — Telephone Encounter (Signed)
Hi Victoria [name]  My experience with other IV infusion of monoclonal antibody for fibrotic lung disease is some patients had the exact same thing you had. And I think is infusion side effect. Next tiime we can slow the infusion down. Over what length did you get the infusion? Std time reported is 20-50 min. I can also call and find out if the right filters and temperature were used (Per textbook infusion admins as follows: Allow diluted solution to reach room temperature. Use an infusion set with an in-line, low protein-binding filter (pore size: 0.2 micron); compatible filters are polyethersulfone (PES), polyvinylidene fluoride (PVDF), nylon, and cellulose acetat)  All that is reporrted in uptodate text is as below in terms of side effect  Adverse Reactions Immunologic: Antibody development (5%)  Neuromuscular & skeletal: Increased creatine phosphokinase (20%; transient), myalgia (1%)  Respiratory: Oropharyngeal pain (3%)  <1%, postmarketing, and/or case reports: Anaphylaxis - black box warning is that this happens in 0.3% of subjections

## 2016-05-05 ENCOUNTER — Encounter (HOSPITAL_COMMUNITY): Payer: PPO

## 2016-05-06 ENCOUNTER — Other Ambulatory Visit (HOSPITAL_COMMUNITY): Payer: Self-pay | Admitting: *Deleted

## 2016-05-09 ENCOUNTER — Ambulatory Visit (HOSPITAL_COMMUNITY)
Admission: RE | Admit: 2016-05-09 | Discharge: 2016-05-09 | Disposition: A | Discharge status: Home / Self Care | Payer: PPO | Source: Ambulatory Visit | Attending: Internal Medicine | Admitting: Internal Medicine

## 2016-05-09 DIAGNOSIS — J4550 Severe persistent asthma, uncomplicated: Secondary | ICD-10-CM | POA: Diagnosis not present

## 2016-05-09 MED ORDER — SODIUM CHLORIDE 0.9 % IV SOLN
3.0000 mg/kg | Freq: Once | INTRAVENOUS | Status: AC
  Administered 2016-05-09: 230 mg via INTRAVENOUS
  Filled 2016-05-09: qty 23

## 2016-05-09 MED ORDER — SODIUM CHLORIDE 0.9 % IV SOLN: INTRAVENOUS | Status: DC

## 2016-05-09 NOTE — Progress Notes (Signed)
Pt states that had sore throat over a week ago which is gone now but still has a little cough.  Spoke with [name] and informed him of this and as long as pt is afebrile and feels good enough to proceed we may proceed.  He did say to run the medicine twice as long as last time to hopefully prevent some of the nausea pt experienced after last infusion.

## 2016-05-10 ENCOUNTER — Telehealth: Payer: Self-pay | Admitting: Internal Medicine

## 2016-05-10 NOTE — Telephone Encounter (Signed)
Patient returned phone call. Contact # [phone].Marland KitchenMearl [name]

## 2016-05-10 NOTE — Telephone Encounter (Signed)
lmtcb x1 for pt. 

## 2016-05-10 NOTE — Telephone Encounter (Signed)
LMOMTCB x 1 

## 2016-05-11 MED ORDER — HYDROCOD POLST-CPM POLST ER 10-8 MG/5ML PO SUER: 5.0000 mL | Freq: Two times a day (BID) | ORAL | 0 refills | Status: DC | PRN

## 2016-05-11 NOTE — Telephone Encounter (Signed)
Patient called this morning requesting written prescription (tussinex) to be picked up today.Victoria [name]

## 2016-05-11 NOTE — Telephone Encounter (Signed)
Spoke with pt who is requesting a refill on tussinex. Pt c/o dry cough at times prod with brownish mucus, mild sob, postnasal drip & wheezing X 9d Pt denies any fever, chills or sweats. Pt currently taking mucinex DM & neb treatment bid with slight improvement during the day.  CY please as MR is unavailable. Thanks.  Current Outpatient Prescriptions on File Prior to Visit  Medication Sig Dispense Refill  . ALPRAZolam (XANAX) 0.5 MG tablet Take 1 tablet (0.5 mg total) by mouth 2 (two) times daily as needed for anxiety (Palpatations, SOB). 30 tablet 1  . aspirin 81 MG tablet Take 81 mg by mouth daily.     . Cholecalciferol (VITAMIN D) 2000 UNITS CAPS Take 2,000 Units by mouth daily.     . Coenzyme Q10 (CO Q 10 PO) Take 200 mg by mouth 2 (two) times daily.    . cyclobenzaprine (FLEXERIL) 10 MG tablet Take 1 tablet (10 mg total) by mouth 3 (three) times daily as needed for muscle spasms. 30 tablet 0  . dextromethorphan-guaiFENesin (MUCINEX DM) 30-600 MG 12hr tablet Take 1 tablet by mouth 2 (two) times daily as needed.     . fexofenadine (ALLEGRA) 180 MG tablet Take 180 mg by mouth daily as needed for allergies.     . fluticasone (FLONASE) 50 MCG/ACT nasal spray Place 2 sprays into both nostrils daily. 16 g 1  . furosemide (LASIX) 20 MG tablet Take 1 tablet (20 mg total) by mouth daily as needed. 30 tablet 1  . HYDROcodone-homatropine (HYCODAN) 5-1.5 MG/5ML syrup Take 5 mLs by mouth every 8 (eight) hours as needed for cough. 120 mL 0  . ibuprofen (ADVIL,MOTRIN) 200 MG tablet Take 200 mg by mouth every 6 (six) hours as needed for mild pain or moderate pain.    Marland Kitchen ipratropium-albuterol (DUONEB) 0.5-2.5 (3) MG/3ML SOLN INHALE THE CONTENTS OF 1 VIAL VIA NEBULIZER UP TO 4 TO 6 TIMES A DAY. 360 mL 2  . Melatonin 1 MG SUBL Take it nightly 30 tablet 0  . montelukast (SINGULAIR) 10 MG tablet TAKE 1 TABLET BY MOUTH once daily 90 tablet 1  . mupirocin nasal ointment (BACTROBAN) 2 % Place 1 application into the  nose 2 (two) times daily. Use one-half of tube in each nostril twice daily for five (5) days. After application, press sides of nose together and gently massage.    . nitroGLYCERIN (NITROSTAT) 0.4 MG SL tablet Place 1 tablet (0.4 mg total) under the tongue every 5 (five) minutes as needed for chest pain. 25 tablet 0  . Omega-3 Fat Ac-Cholecalciferol (OMEGA-3 + VITAMIN D3 ULTRA STR) LIQD Take 1 Units by mouth daily. 1 teaspoon daily- 1600mg  omega 3    . Omeprazole-Sodium Bicarbonate (ZEGERID OTC PO) Take 1 tablet by mouth daily.    Marland Kitchen OVER THE COUNTER MEDICATION Take 1 capsule by mouth daily. Tumeric    . Probiotic Product (PROBIOTIC-10 PO) Take 1 tablet by mouth daily.    Marland Kitchen tiotropium (SPIRIVA HANDIHALER) 18 MCG inhalation capsule INHALE THE CONTENTS OF ONE CAPSULE DAILY 90 capsule 3  . venlafaxine XR (EFFEXOR XR) 37.5 MG 24 hr capsule Take daily along with the 75 mg tablet. ( 112.5mg  daily) 90 capsule 1  . venlafaxine XR (EFFEXOR XR) 75 MG 24 hr capsule Take this tablet along with your 37.5 mg tablet daily. ( 112.5mg  daily) 90 capsule 1   No current facility-administered medications on file prior to visit.    Allergies  Allergen Reactions  . Beclomethasone  Dipropionate Hives and Other (See Comments)     weight gain  . Budesonide-Formoterol Fumarate Hives  . Mometasone Furo-Formoterol Fum Hives and Other (See Comments)    weight gain  . Sulfonamide Derivatives Hives and Rash  . Pulmicort [Budesonide]     Headaches   . Statins     Myalgias, RLS

## 2016-05-11 NOTE — Telephone Encounter (Signed)
Rx printed and placed up front in brown folder. Pt aware & voiced understanding. Nothing further needed.

## 2016-05-11 NOTE — Telephone Encounter (Signed)
Ok refill tussionex

## 2016-05-18 ENCOUNTER — Telehealth: Payer: Self-pay | Admitting: Internal Medicine

## 2016-05-18 MED ORDER — MONTELUKAST SODIUM 10 MG PO TABS: ORAL_TABLET | ORAL | 1 refills | Status: DC

## 2016-05-18 NOTE — Telephone Encounter (Signed)
Called and spoke with pt and she is aware of refill that has been sent to her pharmacy.  Nothing further is needed.  

## 2016-05-27 ENCOUNTER — Telehealth: Payer: Self-pay | Admitting: Internal Medicine

## 2016-05-27 ENCOUNTER — Encounter: Payer: Self-pay | Admitting: Internal Medicine

## 2016-05-27 ENCOUNTER — Encounter: Payer: Self-pay | Admitting: Family Medicine

## 2016-05-27 ENCOUNTER — Ambulatory Visit (INDEPENDENT_AMBULATORY_CARE_PROVIDER_SITE_OTHER): Payer: PPO | Admitting: Family Medicine

## 2016-05-27 VITALS — BP 124/79 | HR 78 | Temp 98.4°F | Resp 17 | Ht 62.0 in | Wt 167.0 lb

## 2016-05-27 DIAGNOSIS — J069 Acute upper respiratory infection, unspecified: Secondary | ICD-10-CM

## 2016-05-27 DIAGNOSIS — R05 Cough: Secondary | ICD-10-CM | POA: Insufficient documentation

## 2016-05-27 DIAGNOSIS — R059 Cough, unspecified: Secondary | ICD-10-CM | POA: Insufficient documentation

## 2016-05-27 DIAGNOSIS — J0191 Acute recurrent sinusitis, unspecified: Secondary | ICD-10-CM | POA: Insufficient documentation

## 2016-05-27 DIAGNOSIS — R062 Wheezing: Secondary | ICD-10-CM | POA: Insufficient documentation

## 2016-05-27 DIAGNOSIS — B9789 Other viral agents as the cause of diseases classified elsewhere: Secondary | ICD-10-CM

## 2016-05-27 MED ORDER — PREDNISONE 10 MG PO TABS
ORAL_TABLET | ORAL | 0 refills | Status: DC
Start: 2016-05-27 — End: 2016-06-10

## 2016-05-27 MED ORDER — AZITHROMYCIN 250 MG PO TABS: 250.0000 mg | ORAL_TABLET | Freq: Every day | ORAL | 0 refills | Status: DC

## 2016-05-27 MED ORDER — OSELTAMIVIR PHOSPHATE 75 MG PO CAPS: 75.0000 mg | ORAL_CAPSULE | Freq: Two times a day (BID) | ORAL | 0 refills | Status: DC

## 2016-05-27 NOTE — Progress Notes (Signed)
Assessment and plan: 1. Viral URI with cough   2. Cough- stable, W at night due to PNDrip   3. Acute on chronic recurrent pansinusitis   4. Wheezes- sx stable -at baseline     Viral URI with cough  Since this is only a couple days of symptoms it's more than likely a viral upper respiratory infection that's now become viral bronchitis. We will treat you with supportive care.    Use the Tussionex for cough that you already have.  If this lasts more than 7-10 days and you become worse or if at any time you get a fever of more than 100.5 on 2 occasions, u may need antibiotics.  Now, this is likely a bad cold  -  Patient tells me she has taken prednisone in the past and had to only start as high as 40 and freq does the regimen I wrote for today. This usually works very well for her and she tolerates it well.   We will use if needed  - she also has upcoming Pulm Appt scheduled and will discuss her sx with them as well if W   Wheezes She will use her Inhaler- chronic and prn, steroids, if W--> she will call.  - supportive care d/c pt   Please see AVS handed out to patient at the end of our visit for additional patient instructions/ counseling done pertaining to today's office visit.   Return if symptoms worsen or fail to improve.    Note: This document was prepared using Dragon voice recognition software and may include unintentional dictation errors.    Subjective:     HPI:  Pt presents with URI sx for 2-3 days ago.      C/o rhinorrhea, ST due to PND, and sinus HA's.   Last night her Sx started into her chest - a lot of congestion.    face pain- over entire face only.    maybe  L >er R ear pain.    No wh/ SOB worse than her baseline, no Cp  Denies objective F/C, No N/V/D, No Rash.    Taking Advil, pseudoephedrine, codeine cough meds for sx.    Overall getting:  No change      Patient Active Problem List   Diagnosis Date Noted  . Intolerance, drug:   statins\cholesterol medications 05/03/2016    Priority: High  . BMI 30.0-30.9,adult 02/18/2016    Priority: High  . Hyperglycemia 11/20/2015    Priority: High  . Anxiety state 08/18/2013    Priority: High  . ELEVATED BP READING WITHOUT DX HYPERTENSION 08/03/2009    Priority: High  . HLD (hyperlipidemia) 09/11/2007    Priority: High  . Depression 12/11/2006    Priority: High  . MRSA (methicillin resistant Staphylococcus aureus) colonization 01/12/2016    Priority: Medium  . Sleep disorder due to stress 01/12/2016    Priority: Medium  . Chronic diastolic CHF (congestive heart failure), NYHA class 2 ([place]) 06/26/2015    Priority: Medium  . Seasonal and perennial allergic rhinitis 10/22/2011    Priority: Medium  . OSA (obstructive sleep apnea) 11/13/2010    Priority: Medium  . Environmental allergies 11/13/2010    Priority: Medium  . DIVERTICULOSIS, COLON 06/11/2010    Priority: Medium  . Esophageal reflux 10/21/2008    Priority: Medium  . STRESS INCONTINENCE 09/11/2007    Priority: Medium  . Disorder of bone and cartilage 12/11/2006    Priority: Medium  . Muscle spasm 05/03/2016  Priority: Low  . Chronic bilateral thoracic back pain 05/03/2016    Priority: Low  . Fatigue 09/01/2011    Priority: Low  . A B P A-ALLERGIC BRONCHOPULMONARY ASPERGILLOSIS 12/14/2007    Priority: Low  . Osteoarthritis 12/11/2006    Priority: Low  . CAP (community acquired pneumonia) 06/07/2016  . Dehydration, moderate 06/07/2016  . Multifocal pneumonia   . Wheezes 05/27/2016  . Acute recurrent sinusitis 05/27/2016  . Cough 05/27/2016  . Viral URI with cough 05/27/2016  . Severe persistent asthma without complication Q000111Q  . Coronary artery disease due to lipid rich plaque 06/26/2015  . 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[place]) Q000111Q  . Cardiomegaly 11/21/2014  . Chest pain, atypical 06/01/2011  . UMBILICAL HERNIA [id number]  . HEPATIC CYST 06/11/2010  . SCOLIOSIS 06/11/2010  .  TRANSAMINASES, SERUM, ELEVATED 05/27/2010  . h/o PALPITATIONS 08/19/2008  . CHONDROMALACIA OF PATELLA 04/15/2008  . Asthma 11/16/2007  . Cushing's syndrome ([place]) 12/11/2006    Past medical history, Surgical history, Family history reviewed and noted below, Social history, Allergies, and Medications have been entered into the medical record, reviewed and changed as needed.   Allergies  Allergen Reactions  . Beclomethasone Dipropionate Hives and Other (See Comments)     weight gain  . Budesonide-Formoterol Fumarate Hives  . Mometasone Furo-Formoterol Fum Hives and Other (See Comments)    weight gain  . Sulfonamide Derivatives Hives and Rash  . Pulmicort [Budesonide]     Headaches   . Statins     Myalgias, RLS    Review of Systems  Constitutional: Positive for chills and malaise/fatigue. Negative for fever.  HENT: Positive for congestion. Negative for ear pain and sore throat.   Respiratory: Positive for cough, sputum production and wheezing. Negative for shortness of breath.   Cardiovascular: Negative for chest pain and palpitations.  Neurological: Positive for headaches. Negative for dizziness.    Objective:   Blood pressure 124/79, pulse 78, temperature 98.4 F (36.9 C), temperature source Oral, resp. rate 17, height 5\' 2"  (1.575 m), weight 167 lb (75.8 kg), SpO2 99 %. Body mass index is 30.54 kg/m. General: Well Developed, well nourished, appropriate for stated age.  Neuro: Alert and oriented x3, extra-ocular muscles intact, sensation grossly intact.  HEENT: Normocephalic, atraumatic, pupils equal round reactive to light, neck supple, no masses, no painful lymphadenopathy, TM's intact B/L, no acute findings. Nares- patent, clear d/c, OP- clear, mild erythema, No TTP sinuses Skin: Warm and dry, no gross rash. Cardiac: RRR, S1 S2,  no murmurs rubs or gallops.  Respiratory: ECTA B/L and A/P- no wh/ rhonchi- appears at her baseline, Not using accessory muscles, speaking in full  sentences- unlabored. Vascular:  No gross lower ext edema, cap RF less 2 sec. Psych: No HI/SI, judgement and insight good, Euthymic mood. Full Affect.   Patient Care Team    Relationship Specialty Notifications Start End  [name], DO PCP - General Family Medicine  12/01/15   Devra Dopp, MD Referring Physician Dermatology  02/18/16   Brand Males, MD Consulting Physician Pulmonary Disease  02/18/16   Dorothy Spark, MD Consulting Physician Cardiology  02/18/16   Dian Queen, MD Consulting Physician Obstetrics and Gynecology  02/18/16   Melissa Montane, MD Consulting Physician Otolaryngology  02/18/16   Jerene Bears, MD Consulting Physician Gastroenterology  02/18/16

## 2016-05-27 NOTE — Telephone Encounter (Signed)
Pt states she seen her PCP this morning due to post nasal drip, prod cough with yellow mucus, wheezing & increased sob. Pt was prescribed z pak and prednisone but was told to hold off on z pak unless she felt worse. Pt states 3 hours after leaving her PCP office she noticing a decline in breathing so pt has z pak. Pt is concerned about the flu due to the rapid progression.  MR please advise. Thanks.

## 2016-05-27 NOTE — Assessment & Plan Note (Addendum)
  Since this is only a couple days of symptoms it's more than likely a viral upper respiratory infection that's now become viral bronchitis. We will treat you with supportive care.    Use the Tussionex for cough that you already have.  If this lasts more than 7-10 days and you become worse or if at any time you get a fever of more than 100.5 on 2 occasions, u may need antibiotics.  Now, this is likely a bad cold  -  Patient tells me she has taken prednisone in the past and had to only start as high as 40 and freq does the regimen I wrote for today. This usually works very well for her and she tolerates it well.   We will use if needed  - she also has upcoming Pulm Appt scheduled and will discuss her sx with them as well if W

## 2016-05-27 NOTE — Patient Instructions (Addendum)
If symptoms persist or worsen return to clinic/call.

## 2016-05-27 NOTE — Telephone Encounter (Signed)
Per MR:  Ok to give Tamiflu 75mg  BID x 5 days and to go ahead an take zpak. Pt should also inform family members if they have the flu to also inform their PCP/provider so they can take Tamiflu.   Called and spoke to pt. Informed her of the recs per MR. Rx sent to preferred pharmacy. Pt verbalized understanding and denied any further questions or concerns at this time.

## 2016-05-30 ENCOUNTER — Encounter: Payer: Self-pay | Admitting: Internal Medicine

## 2016-05-30 NOTE — Telephone Encounter (Signed)
Called and spoke with patient - scheduled for appt with SG on 06/02/16 per MR. Nothing further needed.

## 2016-05-30 NOTE — Telephone Encounter (Signed)
See if TP or SG can see her in office  migith need feno testing and xray  Thanks  Dr. Brand Males, M.D., [facility].C.P Pulmonary and Critical Care Medicine Staff Physician [facility] Pager: [phone], If no answer or between  15:00h - 7:00h: call [phone]  05/30/2016 12:15 PM

## 2016-05-30 NOTE — Telephone Encounter (Signed)
MR  Please Advise-Sick Message please see pt. email below  Victoria [name]  to Brand Males, MD       05/30/16 9:29 AM  F/U after talking to your office Friday afternoon and you called in Tamiflu for me to add to Prednisone and the Z-Pak 250 mg.   Sat. AM I felt worse in the chest. I felt that if things kept progressing I was going to end up in hospital, SO I went up to 60 mg. Prednisone and then 60 mg Sunday. There is a lot of congestion, wheezing, SOB and a lot of deep product coughing.  Saturday the mucous was streaked with blood at times, but not on Sunday. I'm doing breathing treatments  Started feeling better Sunday evening but then productive coughing throughout night.   Thinking I should do 60 mg. Prednisone again today, and then 50 x 3 and 40 x 3, etc.? I will need more Prednisone called for this higher and longer taper.   If you want me to have a chest x-ray I can now get one at the new [facility] where my PCP is - Dr. [name].  Or, let me know if you or Tammy want to see me in office.   Thank you, Victoria [name]

## 2016-05-31 ENCOUNTER — Telehealth: Payer: Self-pay | Admitting: Internal Medicine

## 2016-06-01 NOTE — Telephone Encounter (Signed)
On hold for >12min waiting for rep. Will try back

## 2016-06-02 ENCOUNTER — Encounter: Payer: Self-pay | Admitting: Acute Care

## 2016-06-02 ENCOUNTER — Ambulatory Visit (INDEPENDENT_AMBULATORY_CARE_PROVIDER_SITE_OTHER): Payer: PPO | Admitting: Acute Care

## 2016-06-02 ENCOUNTER — Ambulatory Visit (INDEPENDENT_AMBULATORY_CARE_PROVIDER_SITE_OTHER)
Admission: RE | Admit: 2016-06-02 | Discharge: 2016-06-02 | Disposition: A | Discharge status: Home / Self Care | Payer: PPO | Source: Ambulatory Visit | Attending: Acute Care | Admitting: Acute Care

## 2016-06-02 ENCOUNTER — Telehealth: Payer: Self-pay | Admitting: Internal Medicine

## 2016-06-02 VITALS — BP 116/74 | HR 82 | Ht 62.0 in | Wt 167.0 lb

## 2016-06-02 DIAGNOSIS — R05 Cough: Secondary | ICD-10-CM

## 2016-06-02 DIAGNOSIS — J4550 Severe persistent asthma, uncomplicated: Secondary | ICD-10-CM

## 2016-06-02 DIAGNOSIS — R059 Cough, unspecified: Secondary | ICD-10-CM

## 2016-06-02 MED ORDER — PREDNISONE 10 MG PO TABS: ORAL_TABLET | ORAL | 0 refills | Status: DC

## 2016-06-02 NOTE — Assessment & Plan Note (Addendum)
Resolving mild asthma exacerbation. Plan Chest x-ray in the office today indicates no acute abnormality. Continue your prednisone taper as follows. Prednisone taper; 10 mg tablets:  3 tabs x 2 days, 2 tabs x 2 days 1 tab x 2 days then stop. Continue your Singulair,Spiriva,and Duonebs as you have been doing. Continue your Mucinex ,Allegra and Flonase as you have been doing. Continue your cough syrup as needed. Do not drive if sleepy. Follow up with Dr. Chase Caller in January 2018 as is scheduled. Please contact office for sooner follow up if symptoms do not improve or worsen or seek emergency care

## 2016-06-02 NOTE — Progress Notes (Signed)
History of Present Illness Victoria [name] is a 60 y.o. female with severe persistent asthma and allergy bronchopulmonary aspergillis. She is followed by Dr. Chase Caller.   06/02/2016 Acute OV for cough: Pt presents today with nasal congestion and chest congestion. She was started on Prednisone 05/27/2016 at 40 mg, but pt up titrated it to 60 mg by herself x 2 days. She took 60 x 2 days, and then 40 x 2 days, and will need extra prednisone to complete the taper. She states she is better with the prednisone taper. She continues to have a productive cough.It is a light tan color. She completed a  Z pack  On 06/01/2016. She is taking Tussionex  for sleep. She also completed a full coarse of Tamiflu 75 mg BID. CXR today is clear.She gets her third treatment of Cinquair 06/06/2016.she is hopeful that this will decrease her number of exacerbations. She Will need follow up of lung nodule. Per Dr. Chase Caller. She denies fever, chest pain, orthopnea, or hemoptysis. Denies any recent air travel or automobile travel. She is compliant with her Singulair, Spiriva, DuoNeb's, Mucinex, Allegra, and Flonase.  Tests 06/02/2016: IMPRESSION: No acute abnormality seen.CXR   Past medical hx Past Medical History:  Diagnosis Date  . Allergic bronchopulmonary aspergillosis ([place]) 2008   sees Dr Edmund Hilda pulmonology  . Anemia    iron deficiency, resolved  . Anxiety   . Asthma   . CAD (coronary artery disease)    a. LHC 6/16:  oOM1 60, pRCA 25 >> med Rx  . COPD (chronic obstructive pulmonary disease) ([place])   . Depression    mild  . Diverticulosis   . GERD (gastroesophageal reflux disease)   . H/O hiatal hernia   . History of echocardiogram    Echo 6/16:  Mod LVH, EF 60-65%, no RWMA, Gr 1 DD, trivial MR, normal LA size.  Marland Kitchen Hyperglycemia 11/20/2015  . Hyperlipidemia   . IBS (irritable bowel syndrome)   . Maxillary sinusitis   . Normal cardiac stress test 11/2011   No evidence of ischemia or infarct.  Calculated ejection fraction 72%.  . Obesity   . OSA (obstructive sleep apnea) 02/2012   has stopped using  cpap  . Osteoarthritis   . Osteoporosis   . Pneumonia 11/2011   "before 2013 I hadn't had pneumonia since I was a child" (04/13/2012)  . Pulmonary nodules   . Schatzki's ring      Past surgical hx, Family hx, Social hx all reviewed.  Current Outpatient Prescriptions on File Prior to Visit  Medication Sig  . ALPRAZolam (XANAX) 0.5 MG tablet Take 1 tablet (0.5 mg total) by mouth 2 (two) times daily as needed for anxiety (Palpatations, SOB).  Marland Kitchen aspirin 81 MG tablet Take 81 mg by mouth daily.   . chlorpheniramine-HYDROcodone (TUSSIONEX PENNKINETIC ER) 10-8 MG/5ML SUER Take 5 mLs by mouth every 12 (twelve) hours as needed for cough.  . Cholecalciferol (VITAMIN D) 2000 UNITS CAPS Take 2,000 Units by mouth daily.   . Coenzyme Q10 (CO Q 10 PO) Take 200 mg by mouth 2 (two) times daily.  . cyclobenzaprine (FLEXERIL) 10 MG tablet Take 1 tablet (10 mg total) by mouth 3 (three) times daily as needed for muscle spasms.  Marland Kitchen dextromethorphan-guaiFENesin (MUCINEX DM) 30-600 MG 12hr tablet Take 1 tablet by mouth 2 (two) times daily as needed.   . fexofenadine (ALLEGRA) 180 MG tablet Take 180 mg by mouth daily as needed for allergies.   . fluticasone (FLONASE) 50  MCG/ACT nasal spray Place 2 sprays into both nostrils daily.  . furosemide (LASIX) 20 MG tablet Take 1 tablet (20 mg total) by mouth daily as needed.  Marland Kitchen HYDROcodone-homatropine (HYCODAN) 5-1.5 MG/5ML syrup Take 5 mLs by mouth every 8 (eight) hours as needed for cough.  Marland Kitchen ibuprofen (ADVIL,MOTRIN) 200 MG tablet Take 200 mg by mouth every 6 (six) hours as needed for mild pain or moderate pain.  Marland Kitchen ipratropium-albuterol (DUONEB) 0.5-2.5 (3) MG/3ML SOLN INHALE THE CONTENTS OF 1 VIAL VIA NEBULIZER UP TO 4 TO 6 TIMES A DAY.  . Melatonin 1 MG SUBL Take it nightly  . montelukast (SINGULAIR) 10 MG tablet TAKE 1 TABLET BY MOUTH once daily  . mupirocin  nasal ointment (BACTROBAN) 2 % Place 1 application into the nose 2 (two) times daily. Use one-half of tube in each nostril twice daily for five (5) days. After application, press sides of nose together and gently massage.  . nitroGLYCERIN (NITROSTAT) 0.4 MG SL tablet Place 1 tablet (0.4 mg total) under the tongue every 5 (five) minutes as needed for chest pain.  . Omega-3 Fat Ac-Cholecalciferol (OMEGA-3 + VITAMIN D3 ULTRA STR) LIQD Take 1 Units by mouth daily. 1 teaspoon daily- 1600mg  omega 3  . Omeprazole-Sodium Bicarbonate (ZEGERID OTC PO) Take 1 tablet by mouth daily.  Marland Kitchen OVER THE COUNTER MEDICATION Take 1 capsule by mouth daily. Tumeric  . predniSONE (DELTASONE) 10 MG tablet 40 mg a day for 3 days, 30 mg a day for 3 days, 20 mg a day for 3 days, then 10 mg for 3 days.  . Probiotic Product (PROBIOTIC-10 PO) Take 1 tablet by mouth daily.  Marland Kitchen tiotropium (SPIRIVA HANDIHALER) 18 MCG inhalation capsule INHALE THE CONTENTS OF ONE CAPSULE DAILY  . venlafaxine XR (EFFEXOR XR) 37.5 MG 24 hr capsule Take daily along with the 75 mg tablet. ( 112.5mg  daily)  . venlafaxine XR (EFFEXOR XR) 75 MG 24 hr capsule Take this tablet along with your 37.5 mg tablet daily. ( 112.5mg  daily)   No current facility-administered medications on file prior to visit.      Allergies  Allergen Reactions  . Beclomethasone Dipropionate Hives and Other (See Comments)     weight gain  . Budesonide-Formoterol Fumarate Hives  . Mometasone Furo-Formoterol Fum Hives and Other (See Comments)    weight gain  . Sulfonamide Derivatives Hives and Rash  . Pulmicort [Budesonide]     Headaches   . Statins     Myalgias, RLS    Review Of Systems:  Constitutional:   No  weight loss, night sweats,  Fevers, chills, fatigue, or  lassitude.  HEENT:   No headaches,  Difficulty swallowing,  Tooth/dental problems, or  Sore throat,                No sneezing, itching, ear ache, +nasal congestion, +post nasal drip, ( Both resolving after tx.  With ABX.  CV:  No chest pain,  Orthopnea, PND, swelling in lower extremities, anasarca, dizziness, palpitations, syncope.   GI  No heartburn, indigestion, abdominal pain, nausea, vomiting, diarrhea, change in bowel habits, loss of appetite, bloody stools.   Resp: + shortness of breath with exertion not  at rest.  + excess mucus, + productive cough,  No non-productive cough,  No coughing up of blood.  No change in color of mucus.  + wheezing.  No chest wall deformity  Skin: no rash or lesions.  GU: no dysuria, change in color of urine, no urgency or frequency.  No flank pain, no hematuria   MS:  No joint pain or swelling.  No decreased range of motion.  No back pain.  Psych:  No change in mood or affect. No depression or anxiety.  No memory loss.   Vital Signs BP 116/74 (BP Location: Left Arm, Patient Position: Sitting, Cuff Size: Normal)   Pulse 82   Ht 5\' 2"  (1.575 m)   Wt 167 lb (75.8 kg)   SpO2 100%   BMI 30.54 kg/m    Physical Exam:  General- No distress,  A&Ox3, pleasant ENT: No sinus tenderness, TM clear, pale nasal mucosa, no oral exudate,no post nasal drip, no LAN Cardiac: S1, S2, regular rate and rhythm, no murmur Chest: No wheeze/ rales/ dullness; no accessory muscle use, no nasal flaring, no sternal retractions Abd.: Soft Non-tender Ext: No clubbing cyanosis, edema Neuro:  normal strength Skin: No rashes, warm and dry Psych: normal mood and behavior   Assessment/Plan  Severe persistent asthma without complication Resolving mild asthma exacerbation. Plan Chest x-ray in the office today indicates no acute abnormality. Continue your prednisone taper as follows. Prednisone taper; 10 mg tablets:  3 tabs x 2 days, 2 tabs x 2 days 1 tab x 2 days then stop. Continue your Singulair,Spiriva,and Duonebs as you have been doing. Continue your Mucinex ,Allegra and Flonase as you have been doing. Continue your cough syrup as needed. Do not drive if sleepy. Follow up  with Dr. Chase Caller in January 2018 as is scheduled. Please contact office for sooner follow up if symptoms do not improve or worsen or seek emergency care      Magdalen Spatz, NP 06/02/2016  2:50 PM

## 2016-06-02 NOTE — Patient Instructions (Addendum)
It is good to meet you today.  Continue your prednisone taper as follows. Prednisone taper; 10 mg tablets:  3 tabs x 2 days, 2 tabs x 2 days 1 tab x 2 days then stop. Continue your Singulair,Spiriva,and Duonebs as you have been doing. Continue your Mucinex ,Allegra and Flonase as you have been doing. Continue your cough syrup as needed. Do not drive if sleepy. Follow up with Dr. Chase Caller in January 16th , 2018 as is scheduled. Please contact office for sooner follow up if symptoms do not improve or worsen or seek emergency care

## 2016-06-03 ENCOUNTER — Telehealth: Payer: Self-pay | Admitting: Internal Medicine

## 2016-06-03 NOTE — Telephone Encounter (Signed)
Entered in error. Nothing in this message.

## 2016-06-03 NOTE — Telephone Encounter (Signed)
Called [facility] waited on hold to for 15 mintues, Will hold in triage to try to call again

## 2016-06-03 NOTE — Telephone Encounter (Signed)
Attempted to call the number provided. No answer and I could not leave a message. Will try back.

## 2016-06-03 NOTE — Telephone Encounter (Signed)
Attempted to call [facility]. Received a recording that they do not open until 9am. Will route to triage to follow up on later this AM.

## 2016-06-05 ENCOUNTER — Encounter: Payer: Self-pay | Admitting: Internal Medicine

## 2016-06-06 ENCOUNTER — Telehealth: Payer: Self-pay | Admitting: Internal Medicine

## 2016-06-06 ENCOUNTER — Inpatient Hospital Stay (HOSPITAL_COMMUNITY): Admission: RE | Admit: 2016-06-06 | Payer: PPO | Source: Ambulatory Visit

## 2016-06-06 ENCOUNTER — Encounter (HOSPITAL_COMMUNITY): Payer: Self-pay | Admitting: Emergency Medicine

## 2016-06-06 ENCOUNTER — Encounter: Payer: Self-pay | Admitting: Internal Medicine

## 2016-06-06 ENCOUNTER — Emergency Department (HOSPITAL_COMMUNITY)
Admission: EM | Admit: 2016-06-06 | Discharge: 2016-06-06 | Disposition: A | Discharge status: Home / Self Care | Payer: PPO | Source: Home / Self Care

## 2016-06-06 ENCOUNTER — Emergency Department (HOSPITAL_COMMUNITY): Payer: PPO

## 2016-06-06 DIAGNOSIS — J069 Acute upper respiratory infection, unspecified: Secondary | ICD-10-CM

## 2016-06-06 DIAGNOSIS — Z8249 Family history of ischemic heart disease and other diseases of the circulatory system: Secondary | ICD-10-CM | POA: Diagnosis not present

## 2016-06-06 DIAGNOSIS — Z79899 Other long term (current) drug therapy: Secondary | ICD-10-CM | POA: Insufficient documentation

## 2016-06-06 DIAGNOSIS — I5032 Chronic diastolic (congestive) heart failure: Secondary | ICD-10-CM | POA: Diagnosis not present

## 2016-06-06 DIAGNOSIS — J449 Chronic obstructive pulmonary disease, unspecified: Secondary | ICD-10-CM | POA: Insufficient documentation

## 2016-06-06 DIAGNOSIS — J189 Pneumonia, unspecified organism: Secondary | ICD-10-CM | POA: Diagnosis not present

## 2016-06-06 DIAGNOSIS — E785 Hyperlipidemia, unspecified: Secondary | ICD-10-CM | POA: Diagnosis not present

## 2016-06-06 DIAGNOSIS — Z7982 Long term (current) use of aspirin: Secondary | ICD-10-CM

## 2016-06-06 DIAGNOSIS — J4551 Severe persistent asthma with (acute) exacerbation: Secondary | ICD-10-CM | POA: Diagnosis not present

## 2016-06-06 DIAGNOSIS — J82 Pulmonary eosinophilia, not elsewhere classified: Secondary | ICD-10-CM | POA: Diagnosis not present

## 2016-06-06 DIAGNOSIS — R0682 Tachypnea, not elsewhere classified: Secondary | ICD-10-CM | POA: Diagnosis not present

## 2016-06-06 DIAGNOSIS — F329 Major depressive disorder, single episode, unspecified: Secondary | ICD-10-CM | POA: Diagnosis not present

## 2016-06-06 DIAGNOSIS — Z833 Family history of diabetes mellitus: Secondary | ICD-10-CM | POA: Diagnosis not present

## 2016-06-06 DIAGNOSIS — R05 Cough: Secondary | ICD-10-CM | POA: Diagnosis not present

## 2016-06-06 DIAGNOSIS — E784 Other hyperlipidemia: Secondary | ICD-10-CM | POA: Diagnosis not present

## 2016-06-06 DIAGNOSIS — M419 Scoliosis, unspecified: Secondary | ICD-10-CM | POA: Diagnosis not present

## 2016-06-06 DIAGNOSIS — J440 Chronic obstructive pulmonary disease with acute lower respiratory infection: Secondary | ICD-10-CM | POA: Diagnosis not present

## 2016-06-06 DIAGNOSIS — B4481 Allergic bronchopulmonary aspergillosis: Secondary | ICD-10-CM | POA: Diagnosis not present

## 2016-06-06 DIAGNOSIS — E860 Dehydration: Secondary | ICD-10-CM | POA: Diagnosis not present

## 2016-06-06 DIAGNOSIS — Z5321 Procedure and treatment not carried out due to patient leaving prior to being seen by health care provider: Secondary | ICD-10-CM

## 2016-06-06 DIAGNOSIS — J15212 Pneumonia due to Methicillin resistant Staphylococcus aureus: Secondary | ICD-10-CM | POA: Diagnosis not present

## 2016-06-06 DIAGNOSIS — R0602 Shortness of breath: Secondary | ICD-10-CM | POA: Diagnosis not present

## 2016-06-06 DIAGNOSIS — I2510 Atherosclerotic heart disease of native coronary artery without angina pectoris: Secondary | ICD-10-CM | POA: Diagnosis not present

## 2016-06-06 DIAGNOSIS — R911 Solitary pulmonary nodule: Secondary | ICD-10-CM | POA: Diagnosis not present

## 2016-06-06 DIAGNOSIS — A419 Sepsis, unspecified organism: Secondary | ICD-10-CM | POA: Diagnosis not present

## 2016-06-06 DIAGNOSIS — E872 Acidosis: Secondary | ICD-10-CM | POA: Diagnosis not present

## 2016-06-06 DIAGNOSIS — J181 Lobar pneumonia, unspecified organism: Secondary | ICD-10-CM | POA: Diagnosis not present

## 2016-06-06 LAB — CBC WITH DIFFERENTIAL/PLATELET
BASOS ABS: 0 10*3/uL (ref 0.0–0.1)
Basophils Relative: 0 %
EOS ABS: 0 10*3/uL (ref 0.0–0.7)
Eosinophils Relative: 0 %
HEMATOCRIT: 39 % (ref 36.0–46.0)
Hemoglobin: 13.7 g/dL (ref 12.0–15.0)
LYMPHS ABS: 0.8 10*3/uL (ref 0.7–4.0)
Lymphocytes Relative: 3 %
MCH: 30.2 pg (ref 26.0–34.0)
MCHC: 35.1 g/dL (ref 30.0–36.0)
MCV: 86.1 fL (ref 78.0–100.0)
MONO ABS: 1.3 10*3/uL — AB (ref 0.1–1.0)
MONOS PCT: 5 %
Neutro Abs: 23.7 10*3/uL — ABNORMAL HIGH (ref 1.7–7.7)
Neutrophils Relative %: 92 %
PLATELETS: 295 10*3/uL (ref 150–400)
RBC: 4.53 MIL/uL (ref 3.87–5.11)
RDW: 13.1 % (ref 11.5–15.5)
WBC: 25.8 10*3/uL — AB (ref 4.0–10.5)

## 2016-06-06 LAB — BASIC METABOLIC PANEL
ANION GAP: 9 (ref 5–15)
BUN: 12 mg/dL (ref 6–20)
CALCIUM: 9.1 mg/dL (ref 8.9–10.3)
CO2: 27 mmol/L (ref 22–32)
CREATININE: 0.68 mg/dL (ref 0.44–1.00)
Chloride: 101 mmol/L (ref 101–111)
Glucose, Bld: 151 mg/dL — ABNORMAL HIGH (ref 65–99)
Potassium: 3.8 mmol/L (ref 3.5–5.1)
SODIUM: 137 mmol/L (ref 135–145)

## 2016-06-06 MED ORDER — SODIUM CHLORIDE 0.9 % IV SOLN: INTRAVENOUS | Status: DC

## 2016-06-06 NOTE — Telephone Encounter (Signed)
Ok no infusion today  - not sure when next is schedule  Next - does she want admission? Maybe she should go to ER having failed outpatient Rx. Other option is to do CT chest wo cotnrast 06/06/2016 and we call with results 06/07/16  . And also try  take levaquin 500mg  once daily  X 5 days    Allergies  Allergen Reactions  . Beclomethasone Dipropionate Hives and Other (See Comments)     weight gain  . Budesonide-Formoterol Fumarate Hives  . Mometasone Furo-Formoterol Fum Hives and Other (See Comments)    weight gain  . Sulfonamide Derivatives Hives and Rash  . Pulmicort [Budesonide]     Headaches   . Statins     Myalgias, RLS

## 2016-06-06 NOTE — ED Notes (Signed)
Called back to recheck vital signs with no answer

## 2016-06-06 NOTE — ED Triage Notes (Addendum)
Patient been treated over the past 12 days for URI with zpack, and other meds. patient states that she has had fever, chills over the past couple days. Patient states that her PCP called to give report that she was coming over to be seen.  Patient took tylenol for fever at 630am. Patient has productive cough with brownish phlegm and sometimes blood in it.

## 2016-06-06 NOTE — Telephone Encounter (Signed)
Yeah that is best option. Pelase find out which ER so I can call the ER doc

## 2016-06-06 NOTE — Telephone Encounter (Signed)
Ok to do new order buyt hold infusion right now due to acuteillnes

## 2016-06-06 NOTE — Telephone Encounter (Signed)
Pt called into the office today and this message has been handled via a telephone conversation. MyChart message will be closed.

## 2016-06-06 NOTE — Telephone Encounter (Signed)
Called and spoke to pt. Pt states she is going to [facility] ED.   Will forward to MR as FYI.

## 2016-06-06 NOTE — Telephone Encounter (Signed)
MR  Pt. decided to go to the ER just since everything else she has tried and she still has not been better. She just want to make sure you knew that she will be going.   Brand Males, MD      06/06/16 9:28 AM  Note    Ok no infusion today  - not sure when next is schedule  Next - does she want admission? Maybe she should go to ER having failed outpatient Rx. Other option is to do CT chest wo cotnrast 06/06/2016 and we call with results 06/07/16  . And also try  take levaquin 500mg  once daily  X 5 days         Allergies  Allergen Reactions  . Beclomethasone Dipropionate Hives and Other (See Comments)     weight gain  . Budesonide-Formoterol Fumarate Hives  . Mometasone Furo-Formoterol Fum Hives and Other (See Comments)    weight gain  . Sulfonamide Derivatives Hives and Rash  . Pulmicort [Budesonide]     Headaches  . Statins     Myalgias, RLS

## 2016-06-06 NOTE — Telephone Encounter (Signed)
Laverne at short stay stated that the order expire every 3 months and will need a new order to keep on hand if needed. MR please advise thanks.

## 2016-06-06 NOTE — Telephone Encounter (Addendum)
lmtcb for [facility] short stay. Will need to know what is needing to be faxed.

## 2016-06-06 NOTE — Telephone Encounter (Signed)
Called and spoke to pt. Pt c/o increase in SOB, wheezing, prod cough with green mucus. Pt also states she had a temperature over 101 over the weekend, pt states she is taking Tylenol to help keep temp down. Pt states she was given a zpak and pred taper (pred taper starting at 60mg ) by her PCP on 05/27/16 - pt states she started to feel better but once the zpak was completed and the pred tapered down to 20mg  she began to feel worse. Pt was given a pred taper by Anselm Lis on 06/02/16, pt states today she is to take 20mg . Pt states nothing is taking care of the infection completely. Pt states she is using her Duoneb around three times per day due to the SOB.  Dr. Chase Caller please advise. Thanks.

## 2016-06-06 NOTE — ED Notes (Signed)
Called back for recheck vitals x2

## 2016-06-06 NOTE — Telephone Encounter (Signed)
[facility], on hold for >18min. WCB.

## 2016-06-06 NOTE — ED Notes (Signed)
Called back to recheck vitals x3

## 2016-06-07 ENCOUNTER — Observation Stay (HOSPITAL_COMMUNITY): Payer: PPO

## 2016-06-07 ENCOUNTER — Emergency Department (HOSPITAL_COMMUNITY): Payer: PPO

## 2016-06-07 ENCOUNTER — Encounter (HOSPITAL_COMMUNITY): Payer: Self-pay | Admitting: *Deleted

## 2016-06-07 ENCOUNTER — Inpatient Hospital Stay (HOSPITAL_COMMUNITY)
Admission: EM | Admit: 2016-06-07 | Discharge: 2016-06-10 | DRG: 871 | Disposition: A | Discharge status: Home / Self Care | Payer: PPO | Attending: Internal Medicine | Admitting: Internal Medicine

## 2016-06-07 ENCOUNTER — Encounter (HOSPITAL_COMMUNITY): Payer: PPO

## 2016-06-07 DIAGNOSIS — J181 Lobar pneumonia, unspecified organism: Secondary | ICD-10-CM

## 2016-06-07 DIAGNOSIS — E784 Other hyperlipidemia: Secondary | ICD-10-CM | POA: Diagnosis not present

## 2016-06-07 DIAGNOSIS — E860 Dehydration: Secondary | ICD-10-CM | POA: Diagnosis present

## 2016-06-07 DIAGNOSIS — F418 Other specified anxiety disorders: Secondary | ICD-10-CM | POA: Diagnosis present

## 2016-06-07 DIAGNOSIS — B9789 Other viral agents as the cause of diseases classified elsewhere: Secondary | ICD-10-CM

## 2016-06-07 DIAGNOSIS — J189 Pneumonia, unspecified organism: Secondary | ICD-10-CM | POA: Diagnosis present

## 2016-06-07 DIAGNOSIS — I5032 Chronic diastolic (congestive) heart failure: Secondary | ICD-10-CM | POA: Diagnosis present

## 2016-06-07 DIAGNOSIS — E782 Mixed hyperlipidemia: Secondary | ICD-10-CM | POA: Diagnosis present

## 2016-06-07 DIAGNOSIS — J8283 Eosinophilic asthma: Secondary | ICD-10-CM | POA: Diagnosis present

## 2016-06-07 DIAGNOSIS — J069 Acute upper respiratory infection, unspecified: Secondary | ICD-10-CM

## 2016-06-07 DIAGNOSIS — R059 Cough, unspecified: Secondary | ICD-10-CM

## 2016-06-07 DIAGNOSIS — M419 Scoliosis, unspecified: Secondary | ICD-10-CM | POA: Diagnosis present

## 2016-06-07 DIAGNOSIS — J0191 Acute recurrent sinusitis, unspecified: Secondary | ICD-10-CM

## 2016-06-07 DIAGNOSIS — E872 Acidosis: Secondary | ICD-10-CM | POA: Diagnosis present

## 2016-06-07 DIAGNOSIS — R0682 Tachypnea, not elsewhere classified: Secondary | ICD-10-CM | POA: Diagnosis not present

## 2016-06-07 DIAGNOSIS — J82 Pulmonary eosinophilia, not elsewhere classified: Secondary | ICD-10-CM | POA: Diagnosis not present

## 2016-06-07 DIAGNOSIS — R05 Cough: Secondary | ICD-10-CM | POA: Diagnosis not present

## 2016-06-07 DIAGNOSIS — B4481 Allergic bronchopulmonary aspergillosis: Secondary | ICD-10-CM | POA: Diagnosis present

## 2016-06-07 DIAGNOSIS — E785 Hyperlipidemia, unspecified: Secondary | ICD-10-CM | POA: Diagnosis present

## 2016-06-07 DIAGNOSIS — A419 Sepsis, unspecified organism: Principal | ICD-10-CM | POA: Diagnosis present

## 2016-06-07 DIAGNOSIS — I2510 Atherosclerotic heart disease of native coronary artery without angina pectoris: Secondary | ICD-10-CM | POA: Diagnosis present

## 2016-06-07 DIAGNOSIS — J440 Chronic obstructive pulmonary disease with acute lower respiratory infection: Secondary | ICD-10-CM | POA: Diagnosis present

## 2016-06-07 DIAGNOSIS — R911 Solitary pulmonary nodule: Secondary | ICD-10-CM | POA: Diagnosis not present

## 2016-06-07 DIAGNOSIS — F329 Major depressive disorder, single episode, unspecified: Secondary | ICD-10-CM | POA: Diagnosis present

## 2016-06-07 DIAGNOSIS — J4551 Severe persistent asthma with (acute) exacerbation: Secondary | ICD-10-CM | POA: Diagnosis present

## 2016-06-07 DIAGNOSIS — R062 Wheezing: Secondary | ICD-10-CM

## 2016-06-07 DIAGNOSIS — Z833 Family history of diabetes mellitus: Secondary | ICD-10-CM

## 2016-06-07 DIAGNOSIS — J15212 Pneumonia due to Methicillin resistant Staphylococcus aureus: Secondary | ICD-10-CM | POA: Diagnosis present

## 2016-06-07 DIAGNOSIS — Z8249 Family history of ischemic heart disease and other diseases of the circulatory system: Secondary | ICD-10-CM

## 2016-06-07 HISTORY — DX: Pneumonia, unspecified organism: J18.9

## 2016-06-07 LAB — BASIC METABOLIC PANEL
ANION GAP: 10 (ref 5–15)
BUN: 6 mg/dL (ref 6–20)
CHLORIDE: 103 mmol/L (ref 101–111)
CO2: 23 mmol/L (ref 22–32)
Calcium: 9 mg/dL (ref 8.9–10.3)
Creatinine, Ser: 0.67 mg/dL (ref 0.44–1.00)
GFR calc Af Amer: >60 mL/min (ref >60)
Glucose, Bld: 159 mg/dL — ABNORMAL HIGH (ref 65–99)
POTASSIUM: 3.4 mmol/L — AB (ref 3.5–5.1)
SODIUM: 136 mmol/L (ref 135–145)

## 2016-06-07 LAB — LACTIC ACID, PLASMA: LACTIC ACID, VENOUS: 4.6 mmol/L — AB (ref 0.5–1.9)

## 2016-06-07 LAB — PROCALCITONIN
PROCALCITONIN: 0.37 ng/mL
PROCALCITONIN: 0.14 ng/mL

## 2016-06-07 LAB — CBC WITH DIFFERENTIAL/PLATELET
BASOS ABS: 0 10*3/uL (ref 0.0–0.1)
Basophils Relative: 0 %
EOS ABS: 0 10*3/uL (ref 0.0–0.7)
Eosinophils Relative: 0 %
HCT: 36.6 % (ref 36.0–46.0)
HEMOGLOBIN: 12.5 g/dL (ref 12.0–15.0)
LYMPHS ABS: 0.7 10*3/uL (ref 0.7–4.0)
LYMPHS PCT: 3 %
MCH: 29.8 pg (ref 26.0–34.0)
MCHC: 34.2 g/dL (ref 30.0–36.0)
MCV: 87.1 fL (ref 78.0–100.0)
Monocytes Absolute: 0.8 10*3/uL (ref 0.1–1.0)
Monocytes Relative: 3 %
NEUTROS PCT: 94 %
Neutro Abs: 22.6 10*3/uL — ABNORMAL HIGH (ref 1.7–7.7)
PLATELETS: 262 10*3/uL (ref 150–400)
RBC: 4.2 MIL/uL (ref 3.87–5.11)
RDW: 13.4 % (ref 11.5–15.5)
WBC: 24.1 10*3/uL — AB (ref 4.0–10.5)

## 2016-06-07 LAB — MAGNESIUM: Magnesium: 2.1 mg/dL (ref 1.7–2.4)

## 2016-06-07 LAB — EXPECTORATED SPUTUM ASSESSMENT W GRAM STAIN, RFLX TO RESP C

## 2016-06-07 LAB — EXPECTORATED SPUTUM ASSESSMENT W REFEX TO RESP CULTURE

## 2016-06-07 LAB — MRSA PCR SCREENING: MRSA by PCR: POSITIVE — AB

## 2016-06-07 MED ORDER — ENOXAPARIN SODIUM 40 MG/0.4ML ~~LOC~~ SOLN
40.0000 mg | SUBCUTANEOUS | Status: DC
  Administered 2016-06-07 – 2016-06-09 (×3): 40 mg via SUBCUTANEOUS
  Filled 2016-06-07 (×3): qty 0.4

## 2016-06-07 MED ORDER — IPRATROPIUM-ALBUTEROL 0.5-2.5 (3) MG/3ML IN SOLN
3.0000 mL | Freq: Once | RESPIRATORY_TRACT | Status: AC
  Administered 2016-06-07: 3 mL via RESPIRATORY_TRACT
  Filled 2016-06-07: qty 3

## 2016-06-07 MED ORDER — PANTOPRAZOLE SODIUM 40 MG PO PACK
40.0000 mg | PACK | Freq: Every day | ORAL | Status: DC
  Administered 2016-06-08: 40 mg
  Filled 2016-06-07: qty 20

## 2016-06-07 MED ORDER — VITAMIN D 1000 UNITS PO TABS
2000.0000 [IU] | ORAL_TABLET | Freq: Every day | ORAL | Status: DC
  Administered 2016-06-07 – 2016-06-10 (×4): 2000 [IU] via ORAL
  Filled 2016-06-07 (×4): qty 2

## 2016-06-07 MED ORDER — ACETAMINOPHEN 325 MG PO TABS
650.0000 mg | ORAL_TABLET | Freq: Four times a day (QID) | ORAL | Status: DC | PRN
  Administered 2016-06-07: 650 mg via ORAL
  Filled 2016-06-07: qty 2

## 2016-06-07 MED ORDER — FLUTICASONE PROPIONATE 50 MCG/ACT NA SUSP
2.0000 | Freq: Every day | NASAL | Status: DC
  Administered 2016-06-07 – 2016-06-10 (×3): 2 via NASAL
  Filled 2016-06-07: qty 16

## 2016-06-07 MED ORDER — VENLAFAXINE HCL ER 75 MG PO CP24: 75.0000 mg | ORAL_CAPSULE | Freq: Every day | ORAL | Status: DC

## 2016-06-07 MED ORDER — FLUTICASONE PROPIONATE 50 MCG/ACT NA SUSP: 2.0000 | Freq: Every day | NASAL | Status: DC

## 2016-06-07 MED ORDER — OMEGA-3 + VITAMIN D3 ULTRA STR PO LIQD: 1.0000 [IU] | Freq: Every day | ORAL | Status: DC

## 2016-06-07 MED ORDER — LORATADINE 10 MG PO TABS
10.0000 mg | ORAL_TABLET | Freq: Every day | ORAL | Status: DC
  Administered 2016-06-07 – 2016-06-10 (×4): 10 mg via ORAL
  Filled 2016-06-07 (×4): qty 1

## 2016-06-07 MED ORDER — MUPIROCIN 2 % EX OINT
1.0000 "application " | TOPICAL_OINTMENT | Freq: Two times a day (BID) | CUTANEOUS | Status: DC
  Administered 2016-06-07 – 2016-06-10 (×6): 1 via NASAL
  Filled 2016-06-07 (×3): qty 22

## 2016-06-07 MED ORDER — CHLORHEXIDINE GLUCONATE CLOTH 2 % EX PADS
6.0000 | MEDICATED_PAD | Freq: Every day | CUTANEOUS | Status: DC
  Administered 2016-06-08 – 2016-06-10 (×3): 6 via TOPICAL

## 2016-06-07 MED ORDER — TIOTROPIUM BROMIDE MONOHYDRATE 18 MCG IN CAPS: 18.0000 ug | ORAL_CAPSULE | Freq: Every day | RESPIRATORY_TRACT | Status: DC

## 2016-06-07 MED ORDER — METHYLPREDNISOLONE SODIUM SUCC 125 MG IJ SOLR
60.0000 mg | Freq: Four times a day (QID) | INTRAMUSCULAR | Status: DC
  Administered 2016-06-07: 125 mg via INTRAVENOUS
  Filled 2016-06-07: qty 2

## 2016-06-07 MED ORDER — LEVOFLOXACIN IN D5W 750 MG/150ML IV SOLN
750.0000 mg | INTRAVENOUS | Status: DC
  Administered 2016-06-07 – 2016-06-08 (×3): 750 mg via INTRAVENOUS
  Filled 2016-06-07 (×3): qty 150

## 2016-06-07 MED ORDER — HYDROCOD POLST-CPM POLST ER 10-8 MG/5ML PO SUER
5.0000 mL | Freq: Two times a day (BID) | ORAL | Status: DC | PRN
  Administered 2016-06-08 – 2016-06-09 (×2): 5 mL via ORAL
  Filled 2016-06-07 (×2): qty 5

## 2016-06-07 MED ORDER — OMEPRAZOLE-SODIUM BICARBONATE 20-1100 MG PO CAPS: 1.0000 | ORAL_CAPSULE | Freq: Every day | ORAL | Status: DC

## 2016-06-07 MED ORDER — DEXTROSE 5 % IV SOLN
1.0000 g | Freq: Once | INTRAVENOUS | Status: AC
  Administered 2016-06-07: 1 g via INTRAVENOUS
  Filled 2016-06-07: qty 10

## 2016-06-07 MED ORDER — CO Q 10 10 MG PO CAPS: 200.0000 mg | ORAL_CAPSULE | Freq: Two times a day (BID) | ORAL | Status: DC

## 2016-06-07 MED ORDER — MONTELUKAST SODIUM 10 MG PO TABS
10.0000 mg | ORAL_TABLET | Freq: Every day | ORAL | Status: DC
  Administered 2016-06-07 – 2016-06-09 (×3): 10 mg via ORAL
  Filled 2016-06-07 (×3): qty 1

## 2016-06-07 MED ORDER — ASPIRIN 81 MG PO CHEW
81.0000 mg | CHEWABLE_TABLET | Freq: Every day | ORAL | Status: DC
  Administered 2016-06-07 – 2016-06-10 (×4): 81 mg via ORAL
  Filled 2016-06-07 (×4): qty 1

## 2016-06-07 MED ORDER — METHYLPREDNISOLONE SODIUM SUCC 125 MG IJ SOLR
125.0000 mg | Freq: Once | INTRAMUSCULAR | Status: DC
Start: 2016-06-07 — End: 2016-06-09

## 2016-06-07 MED ORDER — LEVALBUTEROL HCL 0.63 MG/3ML IN NEBU: 0.6300 mg | INHALATION_SOLUTION | Freq: Three times a day (TID) | RESPIRATORY_TRACT | Status: DC

## 2016-06-07 MED ORDER — HYDROCODONE-HOMATROPINE 5-1.5 MG/5ML PO SYRP
5.0000 mL | ORAL_SOLUTION | Freq: Three times a day (TID) | ORAL | Status: DC | PRN
  Administered 2016-06-07: 5 mL via ORAL
  Filled 2016-06-07: qty 5

## 2016-06-07 MED ORDER — IPRATROPIUM-ALBUTEROL 0.5-2.5 (3) MG/3ML IN SOLN
3.0000 mL | RESPIRATORY_TRACT | Status: DC
  Administered 2016-06-07 (×2): 3 mL via RESPIRATORY_TRACT
  Filled 2016-06-07 (×2): qty 3

## 2016-06-07 MED ORDER — DEXTROSE 5 % IV SOLN
1.0000 g | INTRAVENOUS | Status: DC
  Administered 2016-06-08 – 2016-06-10 (×3): 1 g via INTRAVENOUS
  Filled 2016-06-07 (×3): qty 10

## 2016-06-07 MED ORDER — METHYLPREDNISOLONE SODIUM SUCC 125 MG IJ SOLR
60.0000 mg | Freq: Four times a day (QID) | INTRAMUSCULAR | Status: DC
  Administered 2016-06-07 – 2016-06-09 (×7): 60 mg via INTRAVENOUS
  Filled 2016-06-07 (×7): qty 2

## 2016-06-07 MED ORDER — ACETAMINOPHEN 650 MG RE SUPP: 650.0000 mg | Freq: Four times a day (QID) | RECTAL | Status: DC | PRN

## 2016-06-07 MED ORDER — VENLAFAXINE HCL ER 37.5 MG PO CP24: 37.5000 mg | ORAL_CAPSULE | Freq: Every day | ORAL | Status: DC

## 2016-06-07 MED ORDER — CYCLOBENZAPRINE HCL 10 MG PO TABS: 10.0000 mg | ORAL_TABLET | Freq: Three times a day (TID) | ORAL | Status: DC | PRN

## 2016-06-07 MED ORDER — SODIUM CHLORIDE 0.9 % IV SOLN
INTRAVENOUS | Status: DC
  Administered 2016-06-07 – 2016-06-08 (×3): via INTRAVENOUS

## 2016-06-07 MED ORDER — ALPRAZOLAM 0.5 MG PO TABS
0.5000 mg | ORAL_TABLET | Freq: Two times a day (BID) | ORAL | Status: DC | PRN
  Administered 2016-06-09 (×2): 0.5 mg via ORAL
  Filled 2016-06-07 (×2): qty 1

## 2016-06-07 MED ORDER — ALBUTEROL SULFATE (2.5 MG/3ML) 0.083% IN NEBU: 2.5000 mg | INHALATION_SOLUTION | RESPIRATORY_TRACT | Status: DC | PRN

## 2016-06-07 MED ORDER — VENLAFAXINE HCL ER 75 MG PO CP24
112.5000 mg | ORAL_CAPSULE | Freq: Every day | ORAL | Status: DC
  Administered 2016-06-07 – 2016-06-10 (×4): 112.5 mg via ORAL
  Filled 2016-06-07 (×5): qty 1

## 2016-06-07 MED ORDER — ACETAMINOPHEN 500 MG PO TABS
1000.0000 mg | ORAL_TABLET | Freq: Once | ORAL | Status: AC
  Administered 2016-06-07: 1000 mg via ORAL
  Filled 2016-06-07: qty 2

## 2016-06-07 MED ORDER — SODIUM CHLORIDE 0.9 % IV BOLUS (SEPSIS)
500.0000 mL | Freq: Once | INTRAVENOUS | Status: AC
  Administered 2016-06-07: 500 mL via INTRAVENOUS

## 2016-06-07 NOTE — ED Notes (Signed)
Pulmonologist at bedside

## 2016-06-07 NOTE — Consult Note (Signed)
Name: Victoria [name] MRN: [id number] DOB: 01-28-1956    ADMISSION DATE:  06/07/2016 CONSULTATION DATE:  12/19  REFERRING MD :  [name]  CHIEF COMPLAINT:  CAP/ asthmatic exacerbation   BRIEF PATIENT DESCRIPTION:  60 year old female w/ sig h/o allergice bronchopulmonary aspergillosis and severe persistent asthma. Admitting 12/19 for CAP (developed after ZPAK) and Asthmatic exacerbation.   SIGNIFICANT EVENTS    STUDIES:   CT chest 12/19>>   HISTORY OF PRESENT ILLNESS:   This is a 60 year old female f/b [name] w/ sig h/o allergic bronchopulmonary aspergillosis (2008) and severe persistent asthma. She was last seen in our office in f/u on 12/14 after being treated for what was felt to be mild asthmatic exacerbation. Recent review of events as follows: 12/8: seen by PCP w/ "URI symptoms", increase cough, face pain, ear discomfort. Started on pred taper and given script for azith (with instructions to start if no better). [place] office contacted. Pt instructed to start the [place]; also started on Tamiflu x5d.  12/14 seen in f/u our office. Completed abx & tamiflu. Felt a little better haven taken Pred. Still had cough but felt URI symptoms: nasal congestion, ear ache sneezing better. Her CXR was clear. She did receive her third dose of Cinqair.  12/17 symptoms worse, coughing up thick/yellow/green phlegm, + chills, T 100.8. Still on pred. Treating fever w/ APAP 12/18 went to ER but went home as she didn't want to wait in ER so went home 12/19 called 911 to get thru the ER. Not feeling better. CXR w/ new LLL PNA.  PCCM asked to assist w/ care   PAST MEDICAL HISTORY :   has a past medical history of Allergic bronchopulmonary aspergillosis ([place]) (2008); Anemia; Anxiety; Asthma; CAD (coronary artery disease); COPD (chronic obstructive pulmonary disease) ([place]); Depression; Diverticulosis; GERD (gastroesophageal reflux disease); H/O hiatal hernia; History of echocardiogram; Hyperglycemia  (11/20/2015); Hyperlipidemia; IBS (irritable bowel syndrome); Maxillary sinusitis; Normal cardiac stress test (11/2011); Obesity; OSA (obstructive sleep apnea) (02/2012); Osteoarthritis; Osteoporosis; Pneumonia (11/2011); Pulmonary nodules; and Schatzki's ring.  has a past surgical history that includes Cesarean section (1985); Hernia repair (04/13/2012); Appendectomy (1989); Ventral hernia repair (04/13/2012); and Cardiac catheterization (N/A, 11/25/2014).   Prior to Admission medications   Medication Sig Start Date End Date Taking? Authorizing Provider  ALPRAZolam Duanne Moron) 0.5 MG tablet Take 1 tablet (0.5 mg total) by mouth 2 (two) times daily as needed for anxiety (Palpatations, SOB). 09/24/15  Yes Mosie Lukes, MD  aspirin 81 MG tablet Take 81 mg by mouth daily.    Yes Historical Provider, MD  chlorpheniramine-HYDROcodone (TUSSIONEX PENNKINETIC ER) 10-8 MG/5ML SUER Take 5 mLs by mouth every 12 (twelve) hours as needed for cough. 05/11/16  Yes Deneise Lever, MD  Cholecalciferol (VITAMIN D) 2000 UNITS CAPS Take 2,000 Units by mouth daily.    Yes Historical Provider, MD  Coenzyme Q10 (CO Q 10 PO) Take 200 mg by mouth 2 (two) times daily.   Yes Historical Provider, MD  cyclobenzaprine (FLEXERIL) 10 MG tablet Take 1 tablet (10 mg total) by mouth 3 (three) times daily as needed for muscle spasms. 05/03/16  Yes Deborah Opalski, DO  dextromethorphan-guaiFENesin (MUCINEX DM) 30-600 MG 12hr tablet Take 1 tablet by mouth 2 (two) times daily as needed.    Yes Historical Provider, MD  fexofenadine (ALLEGRA) 180 MG tablet Take 180 mg by mouth daily as needed for allergies.    Yes Historical Provider, MD  fluticasone (FLONASE) 50 MCG/ACT nasal spray Place  2 sprays into both nostrils daily. 08/20/15  Yes Edward Saguier, PA-C  furosemide (LASIX) 20 MG tablet Take 1 tablet (20 mg total) by mouth daily as needed. 11/17/14  Yes Reyne Dumas, MD  HYDROcodone-homatropine (HYCODAN) 5-1.5 MG/5ML syrup Take 5 mLs by mouth every 8  (eight) hours as needed for cough. 02/25/16  Yes Tammy S Parrett, NP  ibuprofen (ADVIL,MOTRIN) 200 MG tablet Take 200 mg by mouth every 6 (six) hours as needed for mild pain or moderate pain.   Yes Historical Provider, MD  ipratropium-albuterol (DUONEB) 0.5-2.5 (3) MG/3ML SOLN INHALE THE CONTENTS OF 1 VIAL VIA NEBULIZER UP TO 4 TO 6 TIMES A DAY. 11/24/15  Yes Brand Males, MD  Melatonin 1 MG SUBL Take it nightly 01/12/16  Yes Deborah Opalski, DO  montelukast (SINGULAIR) 10 MG tablet TAKE 1 TABLET BY MOUTH once daily 05/18/16  Yes Brand Males, MD  mupirocin nasal ointment (BACTROBAN) 2 % Place 1 application into the nose 2 (two) times daily. Use one-half of tube in each nostril twice daily for five (5) days. After application, press sides of nose together and gently massage.   Yes Historical Provider, MD  Omega-3 Fat Ac-Cholecalciferol (OMEGA-3 + VITAMIN D3 ULTRA STR) LIQD Take 1 Units by mouth daily. 1 teaspoon daily- 1600mg  omega 3   Yes Historical Provider, MD  Omeprazole-Sodium Bicarbonate (ZEGERID OTC PO) Take 1 tablet by mouth daily.   Yes Historical Provider, MD  OVER THE COUNTER MEDICATION Take 1 capsule by mouth daily. Tumeric   Yes Historical Provider, MD  predniSONE (DELTASONE) 10 MG tablet 40 mg a day for 3 days, 30 mg a day for 3 days, 20 mg a day for 3 days, then 10 mg for 3 days. Patient taking differently: Take 20 mg by mouth daily. 20 mg a day for 3 days, then 10 mg for 3 days. ( total of 14 day) 05/27/16  Yes [name], DO  Probiotic Product (PROBIOTIC-10 PO) Take 1 tablet by mouth daily.   Yes Historical Provider, MD  tiotropium (SPIRIVA HANDIHALER) 18 MCG inhalation capsule INHALE THE CONTENTS OF ONE CAPSULE DAILY 02/25/16  Yes Tammy S Parrett, NP  venlafaxine XR (EFFEXOR XR) 37.5 MG 24 hr capsule Take daily along with the 75 mg tablet. ( 112.5mg  daily) 05/03/16  Yes Deborah Opalski, DO  venlafaxine XR (EFFEXOR XR) 75 MG 24 hr capsule Take this tablet along with your 37.5 mg  tablet daily. ( 112.5mg  daily) 05/03/16  Yes Deborah Opalski, DO  nitroGLYCERIN (NITROSTAT) 0.4 MG SL tablet Place 1 tablet (0.4 mg total) under the tongue every 5 (five) minutes as needed for chest pain. 11/18/14   Mosie Lukes, MD  predniSONE (DELTASONE) 10 MG tablet 3 tabs for 2 days, 2 tabs for 2 days, 1 tab for 2 days. Patient not taking: Reported on 06/07/2016 06/02/16   Magdalen Spatz, NP   Allergies  Allergen Reactions  . Beclomethasone Dipropionate Hives and Other (See Comments)     weight gain  . Budesonide-Formoterol Fumarate Hives  . Mometasone Furo-Formoterol Fum Hives and Other (See Comments)    weight gain  . Sulfonamide Derivatives Hives and Rash  . Pulmicort [Budesonide]     Headaches   . Statins     Myalgias, RLS    FAMILY HISTORY:  family history includes Breast cancer in her mother and sister; Diabetes in her mother; Diverticulosis in her father; Heart attack in her maternal grandfather; Hypertension in her mother; Prostate cancer in her father; Pulmonary embolism  in her brother. SOCIAL HISTORY:  reports that she has never smoked. She has never used smokeless tobacco. She reports that she drinks about 2.4 oz of alcohol per week . She reports that she does not use drugs.  REVIEW OF SYSTEMS:   Constitutional:+ low grade fevers, + intermittent chills, weight loss, + malaise + fatigue and diaphoresis.  HENT: Negative for hearing loss, ear pain, nosebleeds, + congestion, sore throat, neck pain, tinnitus and ear discharge.   Eyes: Negative for blurred vision, double vision, photophobia, pain, discharge and redness.  Respiratory:+  Cough, streaks of hemoptysis, sputum production mostly yellow and beige, shortness of breath, wheezing and stridor.   Cardiovascular: Negative for chest pain, palpitations, orthopnea, claudication, leg swelling and PND.  Gastrointestinal: Negative for heartburn, nausea, vomiting, abdominal pain, diarrhea, constipation, blood in stool and melena.   Genitourinary: Negative for dysuria, urgency, frequency, hematuria and flank pain.  Musculoskeletal: Negative for myalgias, back pain, joint pain and falls.  Skin: Negative for itching and rash.  Neurological: Negative for dizziness, tingling, tremors, sensory change, speech change, focal weakness, seizures, loss of consciousness, weakness and headaches.  Endo/Heme/Allergies: Negative for environmental allergies and polydipsia. Does not bruise/bleed easily.  SUBJECTIVE:  Short of breath; coughing a lot  VITAL SIGNS: Temp:  [99.5 F (37.5 C)] 99.5 F (37.5 C) (12/19 1010) Pulse Rate:  [99-123] 102 (12/19 1300) Resp:  [11-17] 12 (12/19 1300) BP: (104-114)/(60-68) 104/64 (12/19 1300) SpO2:  [95 %-100 %] 98 % (12/19 1300) Room air  PHYSICAL EXAMINATION: General:  60 year old female, ambulatory and in no distress after ambulating in hall  Neuro:  Awake alert no focal def  HEENT:  NCAT, TMs cloudy MMM.  Cardiovascular:  RRR no MRG Lungs:  Diffuse scattered rhonchi and exp wheeze. Crackle LLL Abdomen:  Soft, + bowel sounds non-tender  Musculoskeletal:  Equal st and bulk  Skin:  Warm and dry    Recent Labs Lab 06/06/16 1340 06/07/16 1105  NA 137 136  K 3.8 3.4*  CL 101 103  CO2 27 23  BUN 12 6  CREATININE 0.68 0.67  GLUCOSE 151* 159*    Recent Labs Lab 06/06/16 1340 06/07/16 1105  HGB 13.7 12.5  HCT 39.0 36.6  WBC 25.8* 24.1*  PLT 295 262    Recent Labs Lab 06/06/16 1340 06/07/16 1105  WBC 25.8* 24.1*    Dg Chest 2 View  Result Date: 06/07/2016 CLINICAL DATA:  Fever and chills.  Cough EXAM: CHEST  2 VIEW COMPARISON:  June 06, 2016 FINDINGS: There is patchy airspace consolidation in the left lower lobe. There is mild scarring in both upper lobe regions. There is central peribronchial thickening consistent with chronic bronchitis, stable. Heart size and pulmonary vascularity are normal. There is a focal hiatal hernia. No evident bone lesions. No adenopathy.  IMPRESSION: Airspace consolidation left lower lobe. Chronic bronchitis. Areas of scattered scarring. Cardiac silhouette within normal limits. Hiatal hernia present. Electronically Signed   By: Lowella Grip III M.D.   On: 06/07/2016 11:22   Dg Chest 2 View  Result Date: 06/06/2016 CLINICAL DATA:  Productive cough . EXAM: CHEST  2 VIEW COMPARISON:  06/02/2016.  09/28/2015.  12/04/2014. FINDINGS: Mediastinum and hilar structures normal. Left lower lobe infiltrate consistent pneumonia. No pleural effusion or pneumothorax. Stable calcified pulmonary nodule right lung base consistent granuloma. Stable bilateral pleural-parenchymal thickening noted consistent with scarring . Sliding hiatal hernia. Prior anterior abdominal wall hernia repair. IMPRESSION: 1. Left lower lobe infiltrate consistent with scarring. 2.  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 Electronically Signed   By: Marcello Moores  Register   On: 06/06/2016 13:46  LLL infiltrate   ASSESSMENT / PLAN:  Severe Persistent asthma  CAP Asthmatic Exacerbation   Discussion  Has gotten worse in spite of out-pt treatment. Now w/ LLL infiltrate. Will need in-pt admit >already treated w/ pred, azith and tamiflu  Plan Urine strep & legionella Sputum culture  Agree w/ sending resp viral panel PCT algo non-contrasted CT chest Empiric levaquin and rocephin  Scheduled Steroids Scheduled BDs Flutter valve and is  Erick Colace ACNP-BC [place] Pager # [phone] OR # [phone] if no answer  06/07/2016, 1:32 PM

## 2016-06-07 NOTE — ED Notes (Signed)
Report attempted 

## 2016-06-07 NOTE — ED Notes (Signed)
Pt dyspneic upon ambulation to restroom. Sat is 96%.,

## 2016-06-07 NOTE — ED Notes (Signed)
Placed patient into a gown and on the monitor waiting for provider 

## 2016-06-07 NOTE — ED Provider Notes (Signed)
[facility] DEPT Provider Note   CSN: [id number] Arrival date & time: 06/07/16  1006     History   Chief Complaint Chief Complaint  Patient presents with  . Shortness of Breath    HPI Victoria [name] is a 60 y.o. female.  The history is provided by the patient.  Cough  This is a recurrent problem. Episode onset: 2 weeks. The problem occurs every few minutes. The problem has been gradually worsening (improved following treatment then worsened this weekend). The cough is productive of purulent sputum. The maximum temperature recorded prior to her arrival was 101 to 101.9 F (this weekend). Associated symptoms include chills, headaches (from coughing), rhinorrhea and shortness of breath. Treatments tried: Treated by Pulmonologist with Tamiflu, Z-pak, and prednisone taper last week. The treatment provided moderate relief. She is not a smoker. Past medical history comments: see below.     Past Medical History:  Diagnosis Date  . Allergic bronchopulmonary aspergillosis ([place]) 2008   sees Dr Edmund Hilda pulmonology  . Anemia    iron deficiency, resolved  . Anxiety   . Asthma   . CAD (coronary artery disease)    a. LHC 6/16:  oOM1 60, pRCA 25 >> med Rx  . COPD (chronic obstructive pulmonary disease) ([place])   . Depression    mild  . Diverticulosis   . GERD (gastroesophageal reflux disease)   . H/O hiatal hernia   . History of echocardiogram    Echo 6/16:  Mod LVH, EF 60-65%, no RWMA, Gr 1 DD, trivial MR, normal LA size.  Marland Kitchen Hyperglycemia 11/20/2015  . Hyperlipidemia   . IBS (irritable bowel syndrome)   . Maxillary sinusitis   . Normal cardiac stress test 11/2011   No evidence of ischemia or infarct. Calculated ejection fraction 72%.  . Obesity   . OSA (obstructive sleep apnea) 02/2012   has stopped using  cpap  . Osteoarthritis   . Osteoporosis   . Pneumonia 11/2011   "before 2013 I hadn't had pneumonia since I was a child" (04/13/2012)  . Pulmonary nodules   .  Schatzki's ring     Patient Active Problem List   Diagnosis Date Noted  . CAP (community acquired pneumonia) 06/07/2016  . Dehydration, moderate 06/07/2016  . Wheezes 05/27/2016  . Acute recurrent sinusitis 05/27/2016  . Cough 05/27/2016  . Viral URI with cough 05/27/2016  . Muscle spasm 05/03/2016  . Chronic bilateral thoracic back pain 05/03/2016  . Intolerance, drug:  statins\cholesterol medications 05/03/2016  . Severe persistent asthma without complication [id number]  . BMI 30.0-30.9,adult 02/18/2016  . MRSA (methicillin resistant Staphylococcus aureus) colonization 01/12/2016  . Sleep disorder due to stress 01/12/2016  . Hyperglycemia 11/20/2015  . Chronic diastolic CHF (congestive heart failure), NYHA class 2 ([place]) 06/26/2015  . Coronary artery disease due to lipid rich plaque 06/26/2015  . 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[place]) [id number]  . Cardiomegaly 11/21/2014  . Anxiety state 08/18/2013  . Seasonal and perennial allergic rhinitis 10/22/2011  . Fatigue 09/01/2011  . Chest pain, atypical 06/01/2011  . OSA (obstructive sleep apnea) 11/13/2010  . Environmental allergies 11/13/2010  . UMBILICAL HERNIA [id number]  . DIVERTICULOSIS, COLON 06/11/2010  . HEPATIC CYST 06/11/2010  . SCOLIOSIS 06/11/2010  . TRANSAMINASES, SERUM, ELEVATED 05/27/2010  . ELEVATED BP READING WITHOUT DX HYPERTENSION 08/03/2009  . Esophageal reflux 10/21/2008  . h/o PALPITATIONS 08/19/2008  . CHONDROMALACIA OF [place]A 04/15/2008  . A B P A-ALLERGIC BRONCHOPULMONARY ASPERGILLOSIS 12/14/2007  . Asthma 11/16/2007  . HLD (hyperlipidemia) 09/11/2007  .  STRESS INCONTINENCE 09/11/2007  . Cushing's syndrome ([place]) 12/11/2006  . Depression 12/11/2006  . Osteoarthritis 12/11/2006  . Disorder of bone and cartilage 12/11/2006    Past Surgical History:  Procedure Laterality Date  . APPENDECTOMY  1989  . CARDIAC CATHETERIZATION N/A 11/25/2014   Procedure: Right/Left Heart Cath and Coronary Angiography;  Surgeon:  Belva Crome, MD;  Location: [facility];  Service: Cardiovascular;  Laterality: N/A;  . CESAREAN SECTION  1985  . HERNIA REPAIR  04/13/2012   VHR laparoscopic  . VENTRAL HERNIA REPAIR  04/13/2012   Procedure: LAPAROSCOPIC VENTRAL HERNIA;  Surgeon: Adin Hector, MD;  Location: [facility];  Service: General;  Laterality: N/A;  laparoscopic repair of incarcerated hernia    OB History    No data available       Home Medications    Prior to Admission medications   Medication Sig Start Date End Date Taking? Authorizing Provider  ALPRAZolam Duanne Moron) 0.5 MG tablet Take 1 tablet (0.5 mg total) by mouth 2 (two) times daily as needed for anxiety ([place], SOB). 09/24/15  Yes Mosie Lukes, MD  aspirin 81 MG tablet Take 81 mg by mouth daily.    Yes Historical Provider, MD  chlorpheniramine-HYDROcodone (TUSSIONEX PENNKINETIC ER) 10-8 MG/5ML SUER Take 5 mLs by mouth every 12 (twelve) hours as needed for cough. 05/11/16  Yes Deneise Lever, MD  Cholecalciferol (VITAMIN D) 2000 UNITS CAPS Take 2,000 Units by mouth daily.    Yes Historical Provider, MD  Coenzyme Q10 (CO Q 10 PO) Take 200 mg by mouth 2 (two) times daily.   Yes Historical Provider, MD  cyclobenzaprine (FLEXERIL) 10 MG tablet Take 1 tablet (10 mg total) by mouth 3 (three) times daily as needed for muscle spasms. 05/03/16  Yes Deborah Opalski, DO  dextromethorphan-guaiFENesin (MUCINEX DM) 30-600 MG 12hr tablet Take 1 tablet by mouth 2 (two) times daily as needed.    Yes Historical Provider, MD  fexofenadine (ALLEGRA) 180 MG tablet Take 180 mg by mouth daily as needed for allergies.    Yes Historical Provider, MD  fluticasone (FLONASE) 50 MCG/ACT nasal spray Place 2 sprays into both nostrils daily. 08/20/15  Yes Edward Saguier, PA-C  furosemide (LASIX) 20 MG tablet Take 1 tablet (20 mg total) by mouth daily as needed. 11/17/14  Yes Reyne Dumas, MD  HYDROcodone-homatropine (HYCODAN) 5-1.5 MG/5ML syrup Take 5 mLs by mouth every 8  (eight) hours as needed for cough. 02/25/16  Yes Tammy S Parrett, NP  ibuprofen (ADVIL,MOTRIN) 200 MG tablet Take 200 mg by mouth every 6 (six) hours as needed for mild pain or moderate pain.   Yes Historical Provider, MD  ipratropium-albuterol (DUONEB) 0.5-2.5 (3) MG/3ML SOLN INHALE THE CONTENTS OF 1 VIAL VIA NEBULIZER UP TO 4 TO 6 TIMES A DAY. 11/24/15  Yes Brand Males, MD  Melatonin 1 MG SUBL Take it nightly 01/12/16  Yes Deborah Opalski, DO  montelukast (SINGULAIR) 10 MG tablet TAKE 1 TABLET BY MOUTH once daily 05/18/16  Yes Brand Males, MD  mupirocin nasal ointment (BACTROBAN) 2 % Place 1 application into the nose 2 (two) times daily. Use one-half of tube in each nostril twice daily for five (5) days. After application, press sides of nose together and gently massage.   Yes Historical Provider, MD  Omega-3 Fat Ac-Cholecalciferol (OMEGA-3 + VITAMIN D3 ULTRA STR) LIQD Take 1 Units by mouth daily. 1 teaspoon daily- 1600mg  omega 3   Yes Historical Provider, MD  Omeprazole-Sodium Bicarbonate (ZEGERID OTC  PO) Take 1 tablet by mouth daily.   Yes Historical Provider, MD  OVER THE COUNTER MEDICATION Take 1 capsule by mouth daily. Tumeric   Yes Historical Provider, MD  predniSONE (DELTASONE) 10 MG tablet 40 mg a day for 3 days, 30 mg a day for 3 days, 20 mg a day for 3 days, then 10 mg for 3 days. Patient taking differently: Take 20 mg by mouth daily. 20 mg a day for 3 days, then 10 mg for 3 days. ( total of 14 day) 05/27/16  Yes [name], DO  Probiotic Product (PROBIOTIC-10 PO) Take 1 tablet by mouth daily.   Yes Historical Provider, MD  tiotropium (SPIRIVA HANDIHALER) 18 MCG inhalation capsule INHALE THE CONTENTS OF ONE CAPSULE DAILY 02/25/16  Yes Tammy S Parrett, NP  venlafaxine XR (EFFEXOR XR) 37.5 MG 24 hr capsule Take daily along with the 75 mg tablet. ( 112.5mg  daily) 05/03/16  Yes Deborah Opalski, DO  venlafaxine XR (EFFEXOR XR) 75 MG 24 hr capsule Take this tablet along with your 37.5 mg  tablet daily. ( 112.5mg  daily) 05/03/16  Yes Deborah Opalski, DO  nitroGLYCERIN (NITROSTAT) 0.4 MG SL tablet Place 1 tablet (0.4 mg total) under the tongue every 5 (five) minutes as needed for chest pain. 11/18/14   Mosie Lukes, MD  predniSONE (DELTASONE) 10 MG tablet 3 tabs for 2 days, 2 tabs for 2 days, 1 tab for 2 days. Patient not taking: Reported on 06/07/2016 06/02/16   Magdalen Spatz, NP    Family History Family History  Problem Relation Age of Onset  . Breast cancer Mother   . Hypertension Mother   . Diabetes Mother   . Diverticulosis Father   . Prostate cancer Father     prostate  . Pulmonary embolism Brother     recurrent  . Heart attack Maternal Grandfather   . Breast cancer Sister     breast  . Stroke Neg Hx     Social History Social History  Substance Use Topics  . Smoking status: Never Smoker  . Smokeless tobacco: Never Used  . Alcohol use 2.4 oz/week    4 Glasses of wine per week     Comment: 04/13/2012 "beer in the summer; wine 3-4 times/wk"     Allergies   Beclomethasone dipropionate; Budesonide-formoterol fumarate; Mometasone furo-formoterol fum; Sulfonamide derivatives; Pulmicort [budesonide]; and Statins   Review of Systems Review of Systems  Constitutional: Positive for chills.  HENT: Positive for rhinorrhea.   Respiratory: Positive for cough and shortness of breath.   Neurological: Positive for headaches (from coughing).  Ten systems are reviewed and are negative for acute change except as noted in the HPI    Physical Exam Updated Vital Signs BP 106/60   Pulse (!) 123   Temp 99.5 F (37.5 C) (Oral)   Resp 17   SpO2 97%   Physical Exam  Constitutional: She is oriented to person, place, and time. She appears well-developed and well-nourished. No distress.  HENT:  Head: Normocephalic and atraumatic.  Nose: Nose normal.  Eyes: Conjunctivae and EOM are normal. Pupils are equal, round, and reactive to light. Right eye exhibits no  discharge. Left eye exhibits no discharge. No scleral icterus.  Neck: Normal range of motion. Neck supple.  Cardiovascular: Normal rate and regular rhythm.  Exam reveals no gallop and no friction rub.   No murmur heard. Pulmonary/Chest: Effort normal. No stridor. No respiratory distress. She has wheezes. She has rhonchi. She has rales.  Abdominal:  Soft. She exhibits no distension. There is no tenderness.  Musculoskeletal: She exhibits no edema or tenderness.  Neurological: She is alert and oriented to person, place, and time.  Skin: Skin is warm and dry. No rash noted. She is not diaphoretic. No erythema.  Psychiatric: She has a normal mood and affect.  Vitals reviewed.    ED Treatments / Results  Labs (all labs ordered are listed, but only abnormal results are displayed) Labs Reviewed  CBC WITH DIFFERENTIAL/PLATELET - Abnormal; Notable for the following:       Result Value   WBC 24.1 (*)    Neutro Abs 22.6 (*)    All other components within normal limits  BASIC METABOLIC PANEL - Abnormal; Notable for the following:    Potassium 3.4 (*)    Glucose, Bld 159 (*)    All other components within normal limits  CULTURE, BLOOD (ROUTINE X 2)  CULTURE, BLOOD (ROUTINE X 2)  CULTURE, EXPECTORATED SPUTUM-ASSESSMENT  GRAM STAIN  RESPIRATORY PANEL BY PCR  CULTURE, RESPIRATORY (NON-EXPECTORATED)  HIV ANTIBODY (ROUTINE TESTING)  STREP PNEUMONIAE URINARY ANTIGEN  LEGIONELLA PNEUMOPHILA SEROGP 1 UR AG  PROCALCITONIN    EKG  EKG Interpretation  Date/Time:  Tuesday June 07 2016 10:11:21 EST Ventricular Rate:  122 PR Interval:    QRS Duration: 80 QT Interval:  302 QTC Calculation: 431 R Axis:   59 Text Interpretation:  Sinus tachycardia Baseline wander in lead(s) V3 Otherwise no significant change Confirmed by [facility] MD, Marcelia Petersen ([id number]) on 06/07/2016 11:06:13 AM       Radiology Dg Chest 2 View  Result Date: 06/07/2016 CLINICAL DATA:  Fever and chills.  Cough EXAM: CHEST  2  VIEW COMPARISON:  June 06, 2016 FINDINGS: There is patchy airspace consolidation in the left lower lobe. There is mild scarring in both upper lobe regions. There is central peribronchial thickening consistent with chronic bronchitis, stable. Heart size and pulmonary vascularity are normal. There is a focal hiatal hernia. No evident bone lesions. No adenopathy. IMPRESSION: Airspace consolidation left lower lobe. Chronic bronchitis. Areas of scattered scarring. Cardiac silhouette within normal limits. Hiatal hernia present. Electronically Signed   By: Lowella Grip III M.D.   On: 06/07/2016 11:22   Dg Chest 2 View  Result Date: 06/06/2016 CLINICAL DATA:  Productive cough . EXAM: CHEST  2 VIEW COMPARISON:  06/02/2016.  09/28/2015.  12/04/2014. FINDINGS: Mediastinum and hilar structures normal. Left lower lobe infiltrate consistent pneumonia. No pleural effusion or pneumothorax. Stable calcified pulmonary nodule right lung base consistent granuloma. Stable bilateral pleural-parenchymal thickening noted consistent with scarring . Sliding hiatal hernia. Prior anterior abdominal wall hernia repair. IMPRESSION: 1. Left lower lobe infiltrate consistent with scarring. 2. 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 Electronically Signed   By: Marcello Moores  Register   On: 06/06/2016 13:46   Ct Chest Wo Contrast  Result Date: 06/07/2016 CLINICAL DATA:  History of recurrent pneumonia with shortness of Breath EXAM: CT CHEST WITHOUT CONTRAST TECHNIQUE: Multidetector CT imaging of the chest was performed following the standard protocol without IV contrast. COMPARISON:  Chest x-ray from earlier in the same day FINDINGS: Cardiovascular: Somewhat limited by the lack of IV contrast. Mild aortic calcifications are seen without aneurysmal dilatation. Mild coronary calcifications are seen. Mediastinum/Nodes: Moderate-sized hiatal hernia is identified. Both stomach and abdominal fat extend  into the chest cavity. The thoracic inlet is within normal limits. No significant hilar or mediastinal adenopathy is noted. Scattered small mediastinal lymph nodes are seen. These are  likely reactive in nature. Lungs/Pleura: Apical scarring is noted bilaterally. Mild scarring is also noted in the right middle lobe stable from the prior exam. The remainder the right lung is clear with the exception of a small 5 mm nodule identified in the lower lobe laterally best seen on image 75 of series 7. This was present on the prior exam and has decreased in size consistent with some inflammatory regression. No other nodular changes are noted. The left lung is also well aerated with patchy infiltrates seen in both the lingula and to a greater degree in the lower lobe. No sizable effusion is seen. Upper Abdomen: Hiatal hernia as described above. Musculoskeletal: Degenerative changes of the thoracic spine are noted. IMPRESSION: Lingular and left lower lobe infiltrate consistent with acute pneumonia. Right middle lobe scarring stable from the prior exam related to the prior infiltrate. Right lower lobe nodule slightly decreased in the interval from the prior exam consistent with some resolution of previous infiltrate. Electronically Signed   By: Inez Catalina M.D.   On: 06/07/2016 14:45    Procedures Procedures (including critical care time)  Medications Ordered in ED Medications  acetaminophen (TYLENOL) tablet 1,000 mg (1,000 mg Oral Given 06/07/16 1057)  ipratropium-albuterol (DUONEB) 0.5-2.5 (3) MG/3ML nebulizer solution 3 mL (3 mLs Nebulization Given 06/07/16 1057)  cefTRIAXone (ROCEPHIN) 1 g in dextrose 5 % 50 mL IVPB (0 g Intravenous Stopped 06/07/16 1330)  sodium chloride 0.9 % bolus 500 mL (0 mLs Intravenous Stopped 06/07/16 1441)     Initial Impression / Assessment and Plan / ED Course  I have reviewed the triage vital signs and the nursing notes.  Pertinent labs & imaging results that were available  during my care of the patient were reviewed by me and considered in my medical decision making (see chart for details).  Clinical Course as of Jun 07 1504  Tue Jun 07, 2016  1240 Workup consistent with left lower lobe pneumonia. On review the records I did not know any respiratory culture for a multidrug resistant bacteria. Started on IV Rocephin. We'll consult with pulmonology to see if there is any additional antibiotics they would like to add to the regimen.   [PC]  1330 Discussed case with the pulmonologist who agreed with admission into the hospital. They requested hospitalist admission and they will follow along.  Discussed case with the hospitalist who will admit.  [PC]    Clinical Course User Index [PC] Fatima Blank, MD      Final Clinical Impressions(s) / ED Diagnoses   Final diagnoses:  Cough  Community acquired pneumonia of left lower lobe of lung ([place])    Disposition: Admit  Condition: stable    Fatima Blank, MD 06/07/16 1505

## 2016-06-07 NOTE — Telephone Encounter (Signed)
Spoke to Dr Zenia Resides yesterday of [place]

## 2016-06-07 NOTE — H&P (Signed)
History and Physical    Victoria [name] [id number] DOB: March 26, 1956 DOA: 06/07/2016   PCP: [name], DO   Patient coming from/Resides with: Private residence/lives with husband; patient and husband also provide full care for a 59 year old son who has cerebral palsy who lives in the home  Admission status: Observation/floor -may be medically necessary to stay a minimum 2 midnights to rule out impending and/or unexpected changes in physiologic status that may differ from initial evaluation performed in the ER and/or at time of admission therefore please consider reevaluation of admission status within the next 24 hours. Patient presents with persistent paroxysmal coughing and asthma exacerbation symptoms with new left lower lobe infiltrate on chest x-ray. Patient was treated with oral steroids and macrolide antibiotic prior to presentation. She has significant leukocytosis that may be in part related to demargination from steroids, productive cough with thick brown sputum and subjective hypoxemia with notable dyspnea with ambulation. She also develops paroxysmal coughing episodes with attempts to talk. She appears somewhat dehydrated and will at least need short-term IV fluids for 24 hours.  Chief Complaint: Shortness of breath and cough  HPI: Victoria [name] is a 60 y.o. female with medical history significant for eosinophilic asthma, dyslipidemia, anxiety and depression, and chronic diastolic congestive heart failure. Patient reports that in the past 12 months she has had at least 8 steroid tapers for asthma exacerbations. She has recently been started on IV Cinquair monthly to treat her eosinophilic asthma. Patient initially developed URI symptoms on Wednesday, December 6 with rapid progression in symptoms and by the 8th was given prednisone taper and macrolide antibiotic (Zmax) by her PCP. Unfortunately her symptoms worsened again and she was prescribed Tamiflu but reports a formal influenza test  was not obtained. She completed all the medications prescribed without any improvement of her symptoms and actually had worsening of symptoms. She apparently presented to the [facility] emergency department for evaluation but based on documentation it appears the patient left before being seen by the ER physician. This morning patient's pulmonologist documented that he had spoken to the ER physician on 12/18 with recommendation to pursue CT of the chest without contrast. Because of persistence in symptoms patient returned to [facility] and was evaluated in the ER. She was transported by EMS and was given nebulizer treatment as well as Solu-Medrol 125 mg IV 1 en route. She was quite dyspneic with ambulation but remained non-hypoxic. Chest x-ray did reveal a new left lower lobe infiltrate. ER physician did speak with the pulmonologist on call who gave recommendations regarding antibiotic selection and recommended internal medicine admission.  ED Course:  Vital Signs: BP 109/66   Pulse 97   Temp 99.5 F (37.5 C) (Oral)   Resp 20   SpO2 99%   2 view chest x-ray: Airspace consolidation of the left lower lobe with evidence of chronic bronchitis. Lab data: Sodium 136, potassium 3.4, BUN 6, creatinine 0.67, glucose 159, white count 24,100 with neutrophils 94% and absolute neutrophils 22.6%, hemoglobin 12.5, platelets 262,000 Medications and treatments: Tylenol 1 g 1, DuoNeb 1, Rocephin 1 g IV 1, normal saline 500 mL bolus 1, Spiriva 18 g ordered but not given  Review of Systems:  In addition to the HPI above,  No Fever-chills, myalgias or other constitutional symptoms No Headache, changes with Vision or hearing, new weakness, tingling, numbness in any extremity, dizziness, dysarthria or word finding difficulty, gait disturbance or imbalance, tremors or seizure activity No problems swallowing food or Liquids, indigestion/reflux, choking or  coughing while eating, abdominal pain with or after eating No  Chest pain, palpitations, orthopnea -has been sleeping up in chair secondary to paroxysmal coughing/postnasal drip No Abdominal pain, N/V, melena,hematochezia, dark tarry stools, constipation No dysuria, malodorous urine, hematuria or flank pain No new skin rashes, lesions, masses or bruises, No new joint pains, aches, swelling or redness No recent unintentional weight gain or loss No polyuria, polydypsia or polyphagia   Past Medical History:  Diagnosis Date  . Allergic bronchopulmonary aspergillosis ([place]) 2008   sees Dr Edmund Hilda pulmonology  . Anemia    iron deficiency, resolved  . Anxiety   . Asthma   . CAD (coronary artery disease)    a. LHC 6/16:  oOM1 60, pRCA 25 >> med Rx  . COPD (chronic obstructive pulmonary disease) ([place])   . Depression    mild  . Diverticulosis   . GERD (gastroesophageal reflux disease)   . H/O hiatal hernia   . History of echocardiogram    Echo 6/16:  Mod LVH, EF 60-65%, no RWMA, Gr 1 DD, trivial MR, normal LA size.  Marland Kitchen Hyperglycemia 11/20/2015  . Hyperlipidemia   . IBS (irritable bowel syndrome)   . Maxillary sinusitis   . Normal cardiac stress test 11/2011   No evidence of ischemia or infarct. Calculated ejection fraction 72%.  . Obesity   . OSA (obstructive sleep apnea) 02/2012   has stopped using  cpap  . Osteoarthritis   . Osteoporosis   . Pneumonia 11/2011   "before 2013 I hadn't had pneumonia since I was a child" (04/13/2012)  . Pulmonary nodules   . Schatzki's ring     Past Surgical History:  Procedure Laterality Date  . APPENDECTOMY  1989  . CARDIAC CATHETERIZATION N/A 11/25/2014   Procedure: Right/Left Heart Cath and Coronary Angiography;  Surgeon: Belva Crome, MD;  Location: [facility];  Service: Cardiovascular;  Laterality: N/A;  . CESAREAN SECTION  1985  . HERNIA REPAIR  04/13/2012   VHR laparoscopic  . VENTRAL HERNIA REPAIR  04/13/2012   Procedure: LAPAROSCOPIC VENTRAL HERNIA;  Surgeon: Adin Hector, MD;   Location: [facility];  Service: General;  Laterality: N/A;  laparoscopic repair of incarcerated hernia    Social History   Social History  . Marital status: Married    Spouse name: N/A  . Number of children: 1  . Years of education: N/A   Occupational History  .  Unemployed   Social History Main Topics  . Smoking status: Never Smoker  . Smokeless tobacco: Never Used  . Alcohol use 2.4 oz/week    4 Glasses of wine per week     Comment: 04/13/2012 "beer in the summer; wine 3-4 times/wk"  . Drug use: No  . Sexual activity: Yes     Comment: gluten free, lives with husband and son with CP quadriplegia   Other Topics Concern  . Not on file   Social History Narrative   Cares for a 12yo son with cerebral palsy.     Mobility: No devices Work history: Not obtained   Allergies  Allergen Reactions  . Beclomethasone Dipropionate Hives and Other (See Comments)     weight gain  . Budesonide-Formoterol Fumarate Hives  . Mometasone Furo-Formoterol Fum Hives and Other (See Comments)    weight gain  . Sulfonamide Derivatives Hives and Rash  . Pulmicort [Budesonide]     Headaches   . Statins     Myalgias, RLS    Family History  Problem Relation Age of Onset  . Breast cancer Mother   . Hypertension Mother   . Diabetes Mother   . Diverticulosis Father   . Prostate cancer Father     prostate  . Pulmonary embolism Brother     recurrent  . Heart attack Maternal Grandfather   . Breast cancer Sister     breast  . Stroke Neg Hx      Prior to Admission medications   Medication Sig Start Date End Date Taking? Authorizing Provider  ALPRAZolam Duanne Moron) 0.5 MG tablet Take 1 tablet (0.5 mg total) by mouth 2 (two) times daily as needed for anxiety (Palpatations, SOB). 09/24/15  Yes Mosie Lukes, MD  aspirin 81 MG tablet Take 81 mg by mouth daily.    Yes Historical Provider, MD  chlorpheniramine-HYDROcodone (TUSSIONEX PENNKINETIC ER) 10-8 MG/5ML SUER Take 5 mLs by mouth every 12  (twelve) hours as needed for cough. 05/11/16  Yes Deneise Lever, MD  Cholecalciferol (VITAMIN D) 2000 UNITS CAPS Take 2,000 Units by mouth daily.    Yes Historical Provider, MD  Coenzyme Q10 (CO Q 10 PO) Take 200 mg by mouth 2 (two) times daily.   Yes Historical Provider, MD  cyclobenzaprine (FLEXERIL) 10 MG tablet Take 1 tablet (10 mg total) by mouth 3 (three) times daily as needed for muscle spasms. 05/03/16  Yes Deborah Opalski, DO  dextromethorphan-guaiFENesin (MUCINEX DM) 30-600 MG 12hr tablet Take 1 tablet by mouth 2 (two) times daily as needed.    Yes Historical Provider, MD  fexofenadine (ALLEGRA) 180 MG tablet Take 180 mg by mouth daily as needed for allergies.    Yes Historical Provider, MD  fluticasone (FLONASE) 50 MCG/ACT nasal spray Place 2 sprays into both nostrils daily. 08/20/15  Yes Edward Saguier, PA-C  furosemide (LASIX) 20 MG tablet Take 1 tablet (20 mg total) by mouth daily as needed. 11/17/14  Yes Reyne Dumas, MD  HYDROcodone-homatropine (HYCODAN) 5-1.5 MG/5ML syrup Take 5 mLs by mouth every 8 (eight) hours as needed for cough. 02/25/16  Yes Tammy S Parrett, NP  ibuprofen (ADVIL,MOTRIN) 200 MG tablet Take 200 mg by mouth every 6 (six) hours as needed for mild pain or moderate pain.   Yes Historical Provider, MD  ipratropium-albuterol (DUONEB) 0.5-2.5 (3) MG/3ML SOLN INHALE THE CONTENTS OF 1 VIAL VIA NEBULIZER UP TO 4 TO 6 TIMES A DAY. 11/24/15  Yes Brand Males, MD  Melatonin 1 MG SUBL Take it nightly 01/12/16  Yes Deborah Opalski, DO  montelukast (SINGULAIR) 10 MG tablet TAKE 1 TABLET BY MOUTH once daily 05/18/16  Yes Brand Males, MD  mupirocin nasal ointment (BACTROBAN) 2 % Place 1 application into the nose 2 (two) times daily. Use one-half of tube in each nostril twice daily for five (5) days. After application, press sides of nose together and gently massage.   Yes Historical Provider, MD  Omega-3 Fat Ac-Cholecalciferol (OMEGA-3 + VITAMIN D3 ULTRA STR) LIQD Take 1 Units by  mouth daily. 1 teaspoon daily- 1600mg  omega 3   Yes Historical Provider, MD  Omeprazole-Sodium Bicarbonate (ZEGERID OTC PO) Take 1 tablet by mouth daily.   Yes Historical Provider, MD  OVER THE COUNTER MEDICATION Take 1 capsule by mouth daily. Tumeric   Yes Historical Provider, MD  predniSONE (DELTASONE) 10 MG tablet 40 mg a day for 3 days, 30 mg a day for 3 days, 20 mg a day for 3 days, then 10 mg for 3 days. Patient taking differently: Take 20 mg by mouth daily.  20 mg a day for 3 days, then 10 mg for 3 days. ( total of 14 day) 05/27/16  Yes [name], DO  Probiotic Product (PROBIOTIC-10 PO) Take 1 tablet by mouth daily.   Yes Historical Provider, MD  tiotropium (SPIRIVA HANDIHALER) 18 MCG inhalation capsule INHALE THE CONTENTS OF ONE CAPSULE DAILY 02/25/16  Yes Tammy S Parrett, NP  venlafaxine XR (EFFEXOR XR) 37.5 MG 24 hr capsule Take daily along with the 75 mg tablet. ( 112.5mg  daily) 05/03/16  Yes Deborah Opalski, DO  venlafaxine XR (EFFEXOR XR) 75 MG 24 hr capsule Take this tablet along with your 37.5 mg tablet daily. ( 112.5mg  daily) 05/03/16  Yes Deborah Opalski, DO  nitroGLYCERIN (NITROSTAT) 0.4 MG SL tablet Place 1 tablet (0.4 mg total) under the tongue every 5 (five) minutes as needed for chest pain. 11/18/14   Mosie Lukes, MD  predniSONE (DELTASONE) 10 MG tablet 3 tabs for 2 days, 2 tabs for 2 days, 1 tab for 2 days. Patient not taking: Reported on 06/07/2016 06/02/16   Magdalen Spatz, NP    Physical Exam: Vitals:   06/07/16 1030 06/07/16 1201 06/07/16 1300 06/07/16 1315  BP: 114/60 109/66 104/64 109/66  Pulse: 116 99 102 97  Resp: 13 14 12 20   Temp:      TempSrc:      SpO2: 95% 100% 98% 99%      Constitutional: NAD, calm, uncomfortable secondary to ongoing paroxysmal coughing Eyes: PERRL, lids and conjunctivae normal ENMT: Mucous membranes are dry. Posterior pharynx clear of any exudate or lesions.Normal dentition.  Neck: normal, supple, no masses, no  thyromegaly Respiratory: Diminished lung sounds bilaterally with fine expiratory wheezes, Normal respiratory effort at rest but becomes dyspneic with ambulation. No accessory muscle use. Observed expectoration of thick brown sputum with coughing episode at bedside. Room air Cardiovascular: Regular slightly tachycardic rate and rhythm, no murmurs / rubs / gallops. No extremity edema. 2+ pedal pulses. No carotid bruits.  Abdomen: no tenderness, no masses palpated. No hepatosplenomegaly. Bowel sounds positive.  Musculoskeletal: no clubbing / cyanosis. No joint deformity upper and lower extremities. Good ROM, no contractures. Normal muscle tone.  Skin: no rashes, lesions, ulcers. No induration Neurologic: CN 2-12 grossly intact. Sensation intact, DTR normal. Strength 5/5 x all 4 extremities.  Psychiatric: Normal judgment and insight. Alert and oriented x 3. Normal mood.    Labs on Admission: I have personally reviewed following labs and imaging studies  CBC:  Recent Labs Lab 06/06/16 1340 06/07/16 1105  WBC 25.8* 24.1*  NEUTROABS 23.7* 22.6*  HGB 13.7 12.5  HCT 39.0 36.6  MCV 86.1 87.1  PLT 295 [id number]   Basic Metabolic Panel:  Recent Labs Lab 06/06/16 1340 06/07/16 1105  NA 137 136  K 3.8 3.4*  CL 101 103  CO2 27 23  GLUCOSE 151* 159*  BUN 12 6  CREATININE 0.68 0.67  CALCIUM 9.1 9.0   GFR: Estimated Creatinine Clearance: 71.8 mL/min (by C-G formula based on SCr of 0.67 mg/dL). Liver Function Tests: No results for input(s): AST, ALT, ALKPHOS, BILITOT, PROT, ALBUMIN in the last 168 hours. No results for input(s): LIPASE, AMYLASE in the last 168 hours. No results for input(s): AMMONIA in the last 168 hours. Coagulation Profile: No results for input(s): INR, PROTIME in the last 168 hours. Cardiac Enzymes: No results for input(s): CKTOTAL, CKMB, CKMBINDEX, TROPONINI in the last 168 hours. BNP (last 3 results) No results for input(s): PROBNP in the last 8760 hours. HbA1C: No  results for input(s): HGBA1C in the last 72 hours. CBG: No results for input(s): GLUCAP in the last 168 hours. Lipid Profile: No results for input(s): CHOL, HDL, LDLCALC, TRIG, CHOLHDL, LDLDIRECT in the last 72 hours. Thyroid Function Tests: No results for input(s): TSH, T4TOTAL, FREET4, T3FREE, THYROIDAB in the last 72 hours. Anemia Panel: No results for input(s): VITAMINB12, FOLATE, FERRITIN, TIBC, IRON, RETICCTPCT in the last 72 hours. Urine analysis:    Component Value Date/Time   COLORURINE AMBER (A) 11/08/2014 1311   APPEARANCEUR CLEAR 11/08/2014 1311   LABSPEC 1.022 11/08/2014 1311   PHURINE 7.5 11/08/2014 1311   GLUCOSEU NEGATIVE 11/08/2014 1311   HGBUR NEGATIVE 11/08/2014 1311   HGBUR negative 12/03/2009 0000   BILIRUBINUR SMALL (A) 11/08/2014 1311   BILIRUBINUR neg 11/02/2012 1400   KETONESUR 15 (A) 11/08/2014 1311   PROTEINUR 30 (A) 11/08/2014 1311   UROBILINOGEN 1.0 11/08/2014 1311   NITRITE NEGATIVE 11/08/2014 1311   LEUKOCYTESUR TRACE (A) 11/08/2014 1311   Sepsis Labs: @LABRCNTIP (procalcitonin:4,lacticidven:4) )No results found for this or any previous visit (from the past 240 hour(s)).   Radiological Exams on Admission: Dg Chest 2 View  Result Date: 06/07/2016 CLINICAL DATA:  Fever and chills.  Cough EXAM: CHEST  2 VIEW COMPARISON:  June 06, 2016 FINDINGS: There is patchy airspace consolidation in the left lower lobe. There is mild scarring in both upper lobe regions. There is central peribronchial thickening consistent with chronic bronchitis, stable. Heart size and pulmonary vascularity are normal. There is a focal hiatal hernia. No evident bone lesions. No adenopathy. IMPRESSION: Airspace consolidation left lower lobe. Chronic bronchitis. Areas of scattered scarring. Cardiac silhouette within normal limits. Hiatal hernia present. Electronically Signed   By: Lowella Grip III M.D.   On: 06/07/2016 11:22   Dg Chest 2 View  Result Date:  06/06/2016 CLINICAL DATA:  Productive cough . EXAM: CHEST  2 VIEW COMPARISON:  06/02/2016.  09/28/2015.  12/04/2014. FINDINGS: Mediastinum and hilar structures normal. Left lower lobe infiltrate consistent pneumonia. No pleural effusion or pneumothorax. Stable calcified pulmonary nodule right lung base consistent granuloma. Stable bilateral pleural-parenchymal thickening noted consistent with scarring . Sliding hiatal hernia. Prior anterior abdominal wall hernia repair. IMPRESSION: 1. Left lower lobe infiltrate consistent with scarring. 2. 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 Electronically Signed   By: Marcello Moores  Register   On: 06/06/2016 13:46    EKG: (Independently reviewed) sinus tachycardia with ventricular rate 122 bpm, QTC 431 ms, no acute ischemic changes  Assessment/Plan Principal Problem:   CAP (community acquired pneumonia) -Patient presents after completion of prednisone taper and Z-Pak without improvement in symptoms with new left lower lobe infiltrate on chest x-ray -Given patient's history of eosinophilic asthma and failed outpatient therapy I have discussed with pulmonary medicine who has recommended dual therapy with Rocephin and Levaquin -CT chest without contrast -Treat asthma symptoms (see below) -Procalcitonin -Obtain blood cultures -Sputum culture -Urinary strep and Legionella -Respiratory viral panel -Supportive care with antitussive agents  Active Problems:   Eosinophilic asthma  -Patient given IV Cinquair monthly at pulmonology office -Solu-Medrol 125 mg IV 1 then 60 mg IV every 6 hours -Xopenex neb in setting of tachycardia -Continue preadmission Flonase, Singulair, Claritin, Spiriva -Pulmonary medicine to see in consultation    Chronic diastolic CHF (congestive heart failure), NYHA class 2  -Appears volume depleted -Hold prn Lasix    Dehydration, moderate -Given normal saline bolus in ER -Continue IV fluid at 75 mL  an hour  HLD (hyperlipidemia) -Continue preadmission omega-3 fatty acids and CoQ10    Depression -Continue Effexor and Xanax      DVT prophylaxis: Lovenox Code Status: Full Family Communication: Family at bedside at time of evaluation Disposition Plan: Anticipate discharge back preadmission home about once medically stable Consults called: Pulmonary medicine/[place], ACNP-C     Jena Tegeler L. ANP-BC [facility] Pager [phone]   If 7PM-7AM, please contact night-coverage [email or web address] Password [email or web address]  06/07/2016, 2:11 PM

## 2016-06-07 NOTE — ED Notes (Signed)
Pt returned from xray; ambulated to restroom without distress.

## 2016-06-07 NOTE — ED Notes (Signed)
hospitalist at bedside

## 2016-06-07 NOTE — Telephone Encounter (Signed)
Called [employer] and was advised they will contact us back regarding a PA, as the current rep I was speaking to could not do PA and could not transfer to me to speak with a PA rep.

## 2016-06-07 NOTE — Progress Notes (Signed)
CRITICAL VALUE ALERT  Critical value received: Lactic Acid = 4.6 Date of notification:  06/07/16 Time of notification: 1656 Critical value read back:yes Nurse who received alert:  Arby Barrette MD notified (1st page):  Dr. [name] Time of first page:  36 MD notified (2nd page): N/A  Time of second page:N/A  Responding MD:  Dr. [name] Time MD responded: [phone]

## 2016-06-07 NOTE — ED Notes (Signed)
Pt to CT

## 2016-06-07 NOTE — Telephone Encounter (Signed)
Spoke with pt over the phone. She states that she has already called 911. Pt states that she is not feeling any better and feels that if she goes to the ER by EMS she will be treated faster in the ER. Advised her that if she needed [name] to please let us know. Message will be closed at this time as pt is going to the ER.

## 2016-06-07 NOTE — Telephone Encounter (Signed)
My apologies for the Er issue. I did tlk to Dr Zenia Resides of ER yesterday. I recommend CT chest wo contrast to see what might be goingg on to explain this kind off recurrence and persistent symptms. Can do this week.   We might have tro try for NUCALA or FASENRA instead of CINQAIR if denial is a problem. Why did insurance deny? Please have elise work on this  Dr. Brand Males, M.D., [facility].C.P Pulmonary and Critical Care Medicine Staff Physician [place] [facility] Pager: [phone], If no answer or between  15:00h - 7:00h: call [phone]  06/07/2016 10:55 AM

## 2016-06-07 NOTE — ED Notes (Signed)
Pt in xray at that time.

## 2016-06-07 NOTE — Telephone Encounter (Signed)
MR please advise. Thanks! 

## 2016-06-07 NOTE — Progress Notes (Signed)
Pt admitted to 6N26 via bed from ED.  Pt AAO X4.  Pt on RA.  Pt has 20G to Lt FA with fluids infusing.  Skin intact.  Pt belongings at bedside.  Pt has no complaints at the moment.  Will continue to monitor.

## 2016-06-07 NOTE — Telephone Encounter (Signed)
LMOM TCB x1 for patient 

## 2016-06-08 ENCOUNTER — Telehealth: Payer: Self-pay | Admitting: Internal Medicine

## 2016-06-08 DIAGNOSIS — E872 Acidosis: Secondary | ICD-10-CM | POA: Diagnosis not present

## 2016-06-08 DIAGNOSIS — R0602 Shortness of breath: Secondary | ICD-10-CM | POA: Diagnosis not present

## 2016-06-08 DIAGNOSIS — F329 Major depressive disorder, single episode, unspecified: Secondary | ICD-10-CM | POA: Diagnosis not present

## 2016-06-08 DIAGNOSIS — J4551 Severe persistent asthma with (acute) exacerbation: Secondary | ICD-10-CM | POA: Diagnosis not present

## 2016-06-08 DIAGNOSIS — J189 Pneumonia, unspecified organism: Secondary | ICD-10-CM | POA: Diagnosis not present

## 2016-06-08 DIAGNOSIS — I2510 Atherosclerotic heart disease of native coronary artery without angina pectoris: Secondary | ICD-10-CM | POA: Diagnosis not present

## 2016-06-08 DIAGNOSIS — Z8249 Family history of ischemic heart disease and other diseases of the circulatory system: Secondary | ICD-10-CM | POA: Diagnosis not present

## 2016-06-08 DIAGNOSIS — E860 Dehydration: Secondary | ICD-10-CM | POA: Diagnosis not present

## 2016-06-08 DIAGNOSIS — J181 Lobar pneumonia, unspecified organism: Secondary | ICD-10-CM

## 2016-06-08 DIAGNOSIS — I5032 Chronic diastolic (congestive) heart failure: Secondary | ICD-10-CM | POA: Diagnosis not present

## 2016-06-08 DIAGNOSIS — E785 Hyperlipidemia, unspecified: Secondary | ICD-10-CM | POA: Diagnosis not present

## 2016-06-08 DIAGNOSIS — J440 Chronic obstructive pulmonary disease with acute lower respiratory infection: Secondary | ICD-10-CM | POA: Diagnosis not present

## 2016-06-08 DIAGNOSIS — B4481 Allergic bronchopulmonary aspergillosis: Secondary | ICD-10-CM | POA: Diagnosis not present

## 2016-06-08 DIAGNOSIS — A419 Sepsis, unspecified organism: Secondary | ICD-10-CM | POA: Diagnosis not present

## 2016-06-08 DIAGNOSIS — M419 Scoliosis, unspecified: Secondary | ICD-10-CM | POA: Diagnosis not present

## 2016-06-08 DIAGNOSIS — Z833 Family history of diabetes mellitus: Secondary | ICD-10-CM | POA: Diagnosis not present

## 2016-06-08 DIAGNOSIS — J15212 Pneumonia due to Methicillin resistant Staphylococcus aureus: Secondary | ICD-10-CM | POA: Diagnosis not present

## 2016-06-08 LAB — STREP PNEUMONIAE URINARY ANTIGEN: STREP PNEUMO URINARY ANTIGEN: NEGATIVE

## 2016-06-08 LAB — RESPIRATORY PANEL BY PCR
ADENOVIRUS-RVPPCR: NOT DETECTED
Bordetella pertussis: NOT DETECTED
CHLAMYDOPHILA PNEUMONIAE-RVPPCR: NOT DETECTED
CORONAVIRUS HKU1-RVPPCR: NOT DETECTED
CORONAVIRUS NL63-RVPPCR: NOT DETECTED
Coronavirus 229E: NOT DETECTED
Coronavirus OC43: NOT DETECTED
Influenza A: NOT DETECTED
Influenza B: NOT DETECTED
MYCOPLASMA PNEUMONIAE-RVPPCR: NOT DETECTED
Metapneumovirus: NOT DETECTED
PARAINFLUENZA VIRUS 3-RVPPCR: NOT DETECTED
Parainfluenza Virus 1: NOT DETECTED
Parainfluenza Virus 2: NOT DETECTED
Parainfluenza Virus 4: NOT DETECTED
Respiratory Syncytial Virus: NOT DETECTED
Rhinovirus / Enterovirus: NOT DETECTED

## 2016-06-08 LAB — LACTIC ACID, PLASMA
Lactic Acid, Venous: 2.6 mmol/L (ref 0.5–1.9)
Lactic Acid, Venous: 0.9 mmol/L (ref 0.5–1.9)

## 2016-06-08 LAB — CBC
HEMATOCRIT: 35.2 % — AB (ref 36.0–46.0)
HEMOGLOBIN: 11.9 g/dL — AB (ref 12.0–15.0)
MCH: 29.6 pg (ref 26.0–34.0)
MCHC: 33.8 g/dL (ref 30.0–36.0)
MCV: 87.6 fL (ref 78.0–100.0)
Platelets: 287 10*3/uL (ref 150–400)
RBC: 4.02 MIL/uL (ref 3.87–5.11)
RDW: 13.4 % (ref 11.5–15.5)
WBC: 18.8 10*3/uL — ABNORMAL HIGH (ref 4.0–10.5)

## 2016-06-08 LAB — PHOSPHORUS: PHOSPHORUS: 2.6 mg/dL (ref 2.5–4.6)

## 2016-06-08 LAB — MAGNESIUM: Magnesium: 2.1 mg/dL (ref 1.7–2.4)

## 2016-06-08 LAB — HIV ANTIBODY (ROUTINE TESTING W REFLEX): HIV Screen 4th Generation wRfx: NONREACTIVE

## 2016-06-08 MED ORDER — IPRATROPIUM-ALBUTEROL 0.5-2.5 (3) MG/3ML IN SOLN
3.0000 mL | Freq: Four times a day (QID) | RESPIRATORY_TRACT | Status: DC
  Administered 2016-06-08 – 2016-06-09 (×5): 3 mL via RESPIRATORY_TRACT
  Filled 2016-06-08 (×5): qty 3

## 2016-06-08 MED ORDER — BENZONATATE 100 MG PO CAPS
100.0000 mg | ORAL_CAPSULE | Freq: Three times a day (TID) | ORAL | Status: DC | PRN
Start: 2016-06-08 — End: 2016-06-10
  Administered 2016-06-08: 100 mg via ORAL
  Filled 2016-06-08 (×2): qty 1

## 2016-06-08 MED ORDER — SODIUM CHLORIDE 0.9 % IV BOLUS (SEPSIS)
500.0000 mL | Freq: Once | INTRAVENOUS | Status: AC
  Administered 2016-06-08: 500 mL via INTRAVENOUS

## 2016-06-08 MED ORDER — GUAIFENESIN ER 600 MG PO TB12
1200.0000 mg | ORAL_TABLET | Freq: Two times a day (BID) | ORAL | Status: DC
  Administered 2016-06-08 – 2016-06-10 (×5): 1200 mg via ORAL
  Filled 2016-06-08 (×5): qty 2

## 2016-06-08 MED ORDER — PANTOPRAZOLE SODIUM 40 MG PO TBEC
40.0000 mg | DELAYED_RELEASE_TABLET | Freq: Every day | ORAL | Status: DC
  Administered 2016-06-09 – 2016-06-10 (×2): 40 mg via ORAL
  Filled 2016-06-08 (×2): qty 1

## 2016-06-08 NOTE — Progress Notes (Addendum)
Name: Victoria [name] MRN: [id number] DOB: 1955-11-17    ADMISSION DATE:  06/07/2016 CONSULTATION DATE:  12/19  REFERRING MD :  [name]  CHIEF COMPLAINT:  CAP/ asthmatic exacerbation   BRIEF PATIENT DESCRIPTION:  60 year old female w/ sig h/o allergice bronchopulmonary aspergillosis and severe persistent asthma. Admitting 12/19 for CAP (developed after ZPAK) and Asthmatic exacerbation.   SIGNIFICANT EVENTS  12/19 > Presents to ED with Dyspnea   STUDIES:  CT chest 12/19>> Lingular and left lower lobe infiltrate consistent with acute pneumonia, right middle lobe scarring, right lowe lobe nodule   HISTORY OF PRESENT ILLNESS:   This is a 60 year old female f/b [name] w/ sig h/o allergic bronchopulmonary aspergillosis (2008) and severe persistent asthma. She was last seen in our office in f/u on 12/14 after being treated for what was felt to be mild asthmatic exacerbation. Recent review of events as follows: 12/8: seen by PCP w/ "URI symptoms", increase cough, face pain, ear discomfort. Started on pred taper and given script for azith (with instructions to start if no better). [place] office contacted. Pt instructed to start the [place]; also started on Tamiflu x5d.  12/14 seen in f/u our office. Completed abx & tamiflu. Felt a little better haven taken Pred. Still had cough but felt URI symptoms: nasal congestion, ear ache sneezing better. Her CXR was clear. She did receive her third dose of Cinqair.  12/17 symptoms worse, coughing up thick/yellow/green phlegm, + chills, T 100.8. Still on pred. Treating fever w/ APAP 12/18 went to ER but went home as she didn't want to wait in ER so went home 12/19 called 911 to get thru the ER. Not feeling better. CXR w/ new LLL PNA.  PCCM asked to assist w/ care  SUBJECTIVE:  Reports dry coughing, sitting in chair, feels less short of breath then yesterday  VITAL SIGNS: Temp:  [98.4 F (36.9 C)-98.8 F (37.1 C)] 98.6 F (37 C) (12/20  0559) Pulse Rate:  [78-107] 78 (12/20 0559) Resp:  [12-20] 16 (12/20 0559) BP: (93-129)/(57-79) 129/70 (12/20 0559) SpO2:  [97 %-100 %] 98 % (12/20 0855) Weight:  [75.1 kg (165 lb 8 oz)] 75.1 kg (165 lb 8 oz) (12/20 0400) Room air   PHYSICAL EXAMINATION: General:Adult female, in chair, on room air  Neuro:  Awake, alert, oriented, follows commands  HEENT:  NCAT, TMs cloudy MMM.  Cardiovascular:  RRR no MRG Lungs: exp wheeze noted, unlabored  Abdomen:  Soft, + bowel sounds non-tender  Musculoskeletal:  Equal st and bulk  Skin:  Warm and dry    Recent Labs Lab 06/06/16 1340 06/07/16 1105  NA 137 136  K 3.8 3.4*  CL 101 103  CO2 27 23  BUN 12 6  CREATININE 0.68 0.67  GLUCOSE 151* 159*    Recent Labs Lab 06/06/16 1340 06/07/16 1105  HGB 13.7 12.5  HCT 39.0 36.6  WBC 25.8* 24.1*  PLT 295 262    Recent Labs Lab 06/06/16 1340 06/07/16 1105 06/07/16 1436 06/07/16 1558 06/08/16 0004 06/08/16 0558  PROCALCITON  --   --  0.37 0.14  --   --   WBC 25.8* 24.1*  --   --   --   --   LATICACIDVEN  --   --   --  4.6* 2.6* 0.9    Dg Chest 2 View  Result Date: 06/07/2016 CLINICAL DATA:  Fever and chills.  Cough EXAM: CHEST  2 VIEW COMPARISON:  June 06, 2016 FINDINGS: There  is patchy airspace consolidation in the left lower lobe. There is mild scarring in both upper lobe regions. There is central peribronchial thickening consistent with chronic bronchitis, stable. Heart size and pulmonary vascularity are normal. There is a focal hiatal hernia. No evident bone lesions. No adenopathy. IMPRESSION: Airspace consolidation left lower lobe. Chronic bronchitis. Areas of scattered scarring. Cardiac silhouette within normal limits. Hiatal hernia present. Electronically Signed   By: Lowella Grip III M.D.   On: 06/07/2016 11:22   Dg Chest 2 View  Result Date: 06/06/2016 CLINICAL DATA:  Productive cough . EXAM: CHEST  2 VIEW COMPARISON:  06/02/2016.  09/28/2015.  12/04/2014.  FINDINGS: Mediastinum and hilar structures normal. Left lower lobe infiltrate consistent pneumonia. No pleural effusion or pneumothorax. Stable calcified pulmonary nodule right lung base consistent granuloma. Stable bilateral pleural-parenchymal thickening noted consistent with scarring . Sliding hiatal hernia. Prior anterior abdominal wall hernia repair. IMPRESSION: 1. Left lower lobe infiltrate consistent with scarring. 2. 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 Electronically Signed   By: Marcello Moores  Register   On: 06/06/2016 13:46   Ct Chest Wo Contrast  Result Date: 06/07/2016 CLINICAL DATA:  History of recurrent pneumonia with shortness of Breath EXAM: CT CHEST WITHOUT CONTRAST TECHNIQUE: Multidetector CT imaging of the chest was performed following the standard protocol without IV contrast. COMPARISON:  Chest x-ray from earlier in the same day FINDINGS: Cardiovascular: Somewhat limited by the lack of IV contrast. Mild aortic calcifications are seen without aneurysmal dilatation. Mild coronary calcifications are seen. Mediastinum/Nodes: Moderate-sized hiatal hernia is identified. Both stomach and abdominal fat extend into the chest cavity. The thoracic inlet is within normal limits. No significant hilar or mediastinal adenopathy is noted. Scattered small mediastinal lymph nodes are seen. These are likely reactive in nature. Lungs/Pleura: Apical scarring is noted bilaterally. Mild scarring is also noted in the right middle lobe stable from the prior exam. The remainder the right lung is clear with the exception of a small 5 mm nodule identified in the lower lobe laterally best seen on image 75 of series 7. This was present on the prior exam and has decreased in size consistent with some inflammatory regression. No other nodular changes are noted. The left lung is also well aerated with patchy infiltrates seen in both the lingula and to a greater degree in the lower lobe.  No sizable effusion is seen. Upper Abdomen: Hiatal hernia as described above. Musculoskeletal: Degenerative changes of the thoracic spine are noted. IMPRESSION: Lingular and left lower lobe infiltrate consistent with acute pneumonia. Right middle lobe scarring stable from the prior exam related to the prior infiltrate. Right lower lobe nodule slightly decreased in the interval from the prior exam consistent with some resolution of previous infiltrate. Electronically Signed   By: Inez Catalina M.D.   On: 06/07/2016 14:45  LLL infiltrate   ASSESSMENT / PLAN:  Severe Persistent asthma  CAP Asthmatic Exacerbation   Discussion  Has gotten worse in spite of out-pt treatment. Now w/ LLL infiltrate. Will need in-pt admit >already treated w/ pred, azith and tamiflu  Plan Maintain oxygen saturation >92  Follow culture data  Continue levaquin and rocephin  Continue Steroids (taper down tomorrow)  Scheduled BDs Tessalon PRN ordered Mucinex BID ordered  Flutter valve and is  Hayden Pedro, AG-ACNP [facility]  Pgr: [phone]  PCCM Pgr: [phone].  Attending note: I have seen and examined the patient with nurse practitioner/resident and agree with the note. History, labs  and imaging reviewed.  Blood pressure 118/75, pulse (!) 108, temperature 98.2 F (36.8 C), temperature source Oral, resp. rate 18, height 5\' 2"  (1.575 m), weight 165 lb 8 oz (75.1 kg), SpO2 100 %. Awake, alert, oriented., No distress Gross focal deficits CVS-regular rate and rhythm RS-mild expiratory wheeze, scattered rhonchi. Abdomen-soft, positive bowel sounds Extremities-no edema  Assessment/Plan:  60 year old with bronchopulmonary aspergillosis, severe persistent asthma. Admitted with asthma exacerbation, pneumonia. She has failed outpatient treatment with antibiotics, prednisone, Tamiflu.  CT scan of the chest reviewed. It shows acute left lingular and left lower lobe pneumonia. He is  currently on broad-spectrum antibiotics, IV steroids with improvement in symptoms.  - Continue Levaquin, Rocephin. - Continue steroids at current dose. We will consider tapering tomorrow - Scheduled  Bronchodilators - Tessalon, Mucinex for cough. Continue flutter valve.  Marshell Garfinkel MD [facility] Pager [phone] If no answer or after 3pm call: [phone] 06/08/2016, 3:17 PM

## 2016-06-08 NOTE — Progress Notes (Signed)
PROGRESS NOTE    Victoria [name]  [id number] DOB: 1956-04-10 DOA: 06/07/2016 PCP: [name], DO    Brief Narrative:  60 year old female Chronic bilateral thoracic pain Scoliosis Chronic insomnia BMI 30 Allergic bronchopulmonary aspergillosis 2008 Severe persistent asthma-recent flulike and elevated IgE with intolerance to MDIs  Recently started IL-5 RAb Cinquair  Seen 12/8 PCP URI symptoms started on Pred taper azithromycin/Tamiflu  Followed up pulmonology symptomatically better 12/17's worsening symptoms and ultimately admitted 12/19 with failed outpatient therapy and treat and 4 either eosinophilic asthma + new onset lower lobe pneumonia   Assessment & Plan:   Principal Problem:   CAP (community acquired pneumonia) Active Problems:   HLD (hyperlipidemia)   Depression   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[place])   Chronic diastolic CHF (congestive heart failure), NYHA class 2 (HCC)   Dehydration, moderate   Sepsis secondary to new-onset lower lobe pneumonia in the setting of failed Rx for Community-acquired pneumonia-continue ceftriaxone and Levaquin as has failed outpatient azithromycin therapy Labs from this morning appeared to still be pending on contact precautions and respiratory viral panel is pending however strep pneumo is negative Lactic acidosis is improved from 2.6-->0.9 She is afebrile Saline locked 12/20 Await Resp panel prior to d/c droplet  Allergic bronchopulmonary aspergillosis 2008/asthma/elevated IgE with MDI intolerance  Has had 3 infusions of IL-5 Cinquair  Defer to pulmonology further management this specific issue  Continue Solu-Medrol 60 every 6 with taper as per pulmonology, continue albuterol neb 2.5 every 2 when necessary Tussionex 5 no every 12 when necessary DuoNeb 3 4 times a day scheduled and Singulair 10 mg daily at bedtime, Allegra 180 when necessary allergy, Flonase 50 g nostril daily  Bilateral thoracic pain and scoliosis Possible restrictive  component breathing Monitor as outpatient  History of chronic diastolic heart failure EF 60-65 percent Lasix 20 mg when necessary daily on hold  Depression continue Effexor or 112.5 mgrams daily   DVT prophylaxis: Lovenox Code Status: Full Family Communication:  Disposition Plan:   Consultants:   pulmonary   Procedures:    Antimicrobials:  Ceftriaxone 12/20  Levaquin [was on azithro op and failed] 12/20    Subjective:  Well no specific issue currently Tired overall Eating some Has not been OOB Cough better overnight however No f chill  Objective: Vitals:   06/08/16 0400 06/08/16 0559 06/08/16 0849 06/08/16 0855  BP:  129/70    Pulse:  78    Resp:  16    Temp:  98.6 F (37 C)    TempSrc:  Oral    SpO2:  100% 98% 98%  Weight: 75.1 kg (165 lb 8 oz)     Height: 5\' 2"  (1.575 m)       Intake/Output Summary (Last 24 hours) at 06/08/16 0933 Last data filed at 06/08/16 0600  Gross per 24 hour  Intake          2201.25 ml  Output                0 ml  Net          2201.25 ml   Filed Weights   06/08/16 0400  Weight: 75.1 kg (165 lb 8 oz)    Examination:  General exam: Appears calm and comfortable  Respiratory system: Clear to auscultation. Respiratory effort normal. percucssion note more dull on R post lung fields Cardiovascular system: S1 & S2 heard, RRR. No JVD, murmurs, rubs, gallops or clicks. No pedal edema. Gastrointestinal system: Abdomen is nondistended, soft and nontender. No organomegaly  Central nervous system: Alert and oriented. No focal neurological deficits. Extremities: Symmetric 5 x 5 power. Skin: No rashes, lesions or ulcers Psychiatry: Judgement and insight appear normal. Mood & affect appropriate.     Data Reviewed: I have personally reviewed following labs and imaging studies  CBC:  Recent Labs Lab 06/06/16 1340 06/07/16 1105  WBC 25.8* 24.1*  NEUTROABS 23.7* 22.6*  HGB 13.7 12.5  HCT 39.0 36.6  MCV 86.1 87.1  PLT 295  [id number]   Basic Metabolic Panel:  Recent Labs Lab 06/06/16 1340 06/07/16 1105 06/07/16 1941 06/08/16 0004  NA 137 136  --   --   K 3.8 3.4*  --   --   CL 101 103  --   --   CO2 27 23  --   --   GLUCOSE 151* 159*  --   --   BUN 12 6  --   --   CREATININE 0.68 0.67  --   --   CALCIUM 9.1 9.0  --   --   MG  --   --  2.1 2.1  PHOS  --   --   --  2.6   GFR: Estimated Creatinine Clearance: 71.8 mL/min (by C-G formula based on SCr of 0.67 mg/dL). Liver Function Tests: No results for input(s): AST, ALT, ALKPHOS, BILITOT, PROT, ALBUMIN in the last 168 hours. No results for input(s): LIPASE, AMYLASE in the last 168 hours. No results for input(s): AMMONIA in the last 168 hours. Coagulation Profile: No results for input(s): INR, PROTIME in the last 168 hours. Cardiac Enzymes: No results for input(s): CKTOTAL, CKMB, CKMBINDEX, TROPONINI in the last 168 hours. BNP (last 3 results) No results for input(s): PROBNP in the last 8760 hours. HbA1C: No results for input(s): HGBA1C in the last 72 hours. CBG: No results for input(s): GLUCAP in the last 168 hours. Lipid Profile: No results for input(s): CHOL, HDL, LDLCALC, TRIG, CHOLHDL, LDLDIRECT in the last 72 hours. Thyroid Function Tests: No results for input(s): TSH, T4TOTAL, FREET4, T3FREE, THYROIDAB in the last 72 hours. Anemia Panel: No results for input(s): VITAMINB12, FOLATE, FERRITIN, TIBC, IRON, RETICCTPCT in the last 72 hours. Sepsis Labs:  Recent Labs Lab 06/07/16 1436 06/07/16 1558 06/08/16 0004 06/08/16 0558  PROCALCITON 0.37 0.14  --   --   LATICACIDVEN  --  4.6* 2.6* 0.9    Recent Results (from the past 240 hour(s))  Culture, sputum-assessment     Status: None   Collection Time: 06/07/16  1:52 PM  Result Value Ref Range Status   Specimen Description EXPECTORATED SPUTUM  Final   Special Requests NONE  Final   Sputum evaluation THIS SPECIMEN IS ACCEPTABLE FOR SPUTUM CULTURE  Final   Report Status 06/07/2016 FINAL   Final  Culture, respiratory (NON-Expectorated)     Status: None (Preliminary result)   Collection Time: 06/07/16  1:52 PM  Result Value Ref Range Status   Specimen Description EXPECTORATED SPUTUM  Final   Special Requests NONE Reflexed from [id number]  Final   Gram Stain   Final    ABUNDANT WBC PRESENT, PREDOMINANTLY PMN ABUNDANT GRAM POSITIVE COCCI IN CLUSTERS FEW GRAM NEGATIVE RODS FEW GRAM NEGATIVE COCCI IN PAIRS RARE GRAM POSITIVE RODS    Culture PENDING  Incomplete   Report Status PENDING  Incomplete  Culture, blood (routine x 2) Call MD if unable to obtain prior to antibiotics being given     Status: None (Preliminary result)   Collection Time: 06/07/16  2:47 PM  Result Value Ref Range Status   Specimen Description BLOOD RIGHT ANTECUBITAL  Final   Special Requests BOTTLES DRAWN AEROBIC AND ANAEROBIC 5CC  Final   Culture NO GROWTH < 24 HOURS  Final   Report Status PENDING  Incomplete  MRSA PCR Screening     Status: Abnormal   Collection Time: 06/07/16  6:31 PM  Result Value Ref Range Status   MRSA by PCR POSITIVE (A) NEGATIVE Final    Comment:        The GeneXpert MRSA Assay (FDA approved for NASAL specimens only), is one component of a comprehensive MRSA colonization surveillance program. It is not intended to diagnose MRSA infection nor to guide or monitor treatment for MRSA infections. RESULT CALLED TO, READ BACK BY AND VERIFIED WITH: L.ABIERA,RN AT 2203,06/07/16          Radiology Studies: Dg Chest 2 View  Result Date: 06/07/2016 CLINICAL DATA:  Fever and chills.  Cough EXAM: CHEST  2 VIEW COMPARISON:  June 06, 2016 FINDINGS: There is patchy airspace consolidation in the left lower lobe. There is mild scarring in both upper lobe regions. There is central peribronchial thickening consistent with chronic bronchitis, stable. Heart size and pulmonary vascularity are normal. There is a focal hiatal hernia. No evident bone lesions. No adenopathy. IMPRESSION:  Airspace consolidation left lower lobe. Chronic bronchitis. Areas of scattered scarring. Cardiac silhouette within normal limits. Hiatal hernia present. Electronically Signed   By: Lowella Grip III M.D.   On: 06/07/2016 11:22   Dg Chest 2 View  Result Date: 06/06/2016 CLINICAL DATA:  Productive cough . EXAM: CHEST  2 VIEW COMPARISON:  06/02/2016.  09/28/2015.  12/04/2014. FINDINGS: Mediastinum and hilar structures normal. Left lower lobe infiltrate consistent pneumonia. No pleural effusion or pneumothorax. Stable calcified pulmonary nodule right lung base consistent granuloma. Stable bilateral pleural-parenchymal thickening noted consistent with scarring . Sliding hiatal hernia. Prior anterior abdominal wall hernia repair. IMPRESSION: 1. Left lower lobe infiltrate consistent with scarring. 2. 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 Electronically Signed   By: Marcello Moores  Register   On: 06/06/2016 13:46   Ct Chest Wo Contrast  Result Date: 06/07/2016 CLINICAL DATA:  History of recurrent pneumonia with shortness of Breath EXAM: CT CHEST WITHOUT CONTRAST TECHNIQUE: Multidetector CT imaging of the chest was performed following the standard protocol without IV contrast. COMPARISON:  Chest x-ray from earlier in the same day FINDINGS: Cardiovascular: Somewhat limited by the lack of IV contrast. Mild aortic calcifications are seen without aneurysmal dilatation. Mild coronary calcifications are seen. Mediastinum/Nodes: Moderate-sized hiatal hernia is identified. Both stomach and abdominal fat extend into the chest cavity. The thoracic inlet is within normal limits. No significant hilar or mediastinal adenopathy is noted. Scattered small mediastinal lymph nodes are seen. These are likely reactive in nature. Lungs/Pleura: Apical scarring is noted bilaterally. Mild scarring is also noted in the right middle lobe stable from the prior exam. The remainder the right lung is clear  with the exception of a small 5 mm nodule identified in the lower lobe laterally best seen on image 75 of series 7. This was present on the prior exam and has decreased in size consistent with some inflammatory regression. No other nodular changes are noted. The left lung is also well aerated with patchy infiltrates seen in both the lingula and to a greater degree in the lower lobe. No sizable effusion is seen. Upper Abdomen: Hiatal hernia as described above. Musculoskeletal: Degenerative changes of the thoracic spine  are noted. IMPRESSION: Lingular and left lower lobe infiltrate consistent with acute pneumonia. Right middle lobe scarring stable from the prior exam related to the prior infiltrate. Right lower lobe nodule slightly decreased in the interval from the prior exam consistent with some resolution of previous infiltrate. Electronically Signed   By: Inez Catalina M.D.   On: 06/07/2016 14:45        Scheduled Meds: . aspirin  81 mg Oral Daily  . cefTRIAXone (ROCEPHIN)  IV  1 g Intravenous Q24H  . Chlorhexidine Gluconate Cloth  6 each Topical Q0600  . cholecalciferol  2,000 Units Oral Daily  . enoxaparin (LOVENOX) injection  40 mg Subcutaneous Q24H  . fluticasone  2 spray Each Nare Daily  . ipratropium-albuterol  3 mL Nebulization QID  . levofloxacin (LEVAQUIN) IV  750 mg Intravenous Q24H  . loratadine  10 mg Oral Daily  . methylPREDNISolone (SOLU-MEDROL) injection  125 mg Intravenous Once  . methylPREDNISolone (SOLU-MEDROL) injection  60 mg Intravenous Q6H  . montelukast  10 mg Oral QHS  . mupirocin ointment  1 application Nasal BID  . pantoprazole sodium  40 mg Per Tube Daily  . venlafaxine XR  112.5 mg Oral Q breakfast   Continuous Infusions: . sodium chloride 75 mL/hr at 06/08/16 0849     LOS: 0 days    Time spent: [place], [place], MD [facility] Pager [phone]  If 7PM-7AM, please contact night-coverage [email or web address] Password [facility] 06/08/2016,  9:33 AM

## 2016-06-08 NOTE — Telephone Encounter (Signed)
Called [facility] and spoke with [employer]. Was instructed I needed to call [phone] due to the pt being Medicare. Spoke with [employer]. PA could not be completed over the phone. Ayesha Rumpf is faxing a PA form to [employer] attention.

## 2016-06-08 NOTE — Progress Notes (Signed)
CRITICAL VALUE ALERT  Critical value received: lactic acid level 2.6  Date of notification: 06/08/16  Time of notification: 0120  Critical value read back:yes  Nurse who received alert:Latoshia Monrroy A.  MD notified (1st page): K. Schorr  Time of first page: 0121  MD notified (2nd page):  Time of second page:  Responding MD: Lamar Blinks  Time MD [id number]

## 2016-06-09 LAB — COMPREHENSIVE METABOLIC PANEL
ALT: 54 U/L (ref 14–54)
ANION GAP: 6 (ref 5–15)
AST: 30 U/L (ref 15–41)
Albumin: 2.6 g/dL — ABNORMAL LOW (ref 3.5–5.0)
Alkaline Phosphatase: 108 U/L (ref 38–126)
BILIRUBIN TOTAL: 0.4 mg/dL (ref 0.3–1.2)
BUN: 10 mg/dL (ref 6–20)
CALCIUM: 8.7 mg/dL — AB (ref 8.9–10.3)
CO2: 24 mmol/L (ref 22–32)
Chloride: 107 mmol/L (ref 101–111)
Creatinine, Ser: 0.61 mg/dL (ref 0.44–1.00)
GFR calc Af Amer: >60 mL/min (ref >60)
Glucose, Bld: 145 mg/dL — ABNORMAL HIGH (ref 65–99)
POTASSIUM: 3.8 mmol/L (ref 3.5–5.1)
Sodium: 137 mmol/L (ref 135–145)
TOTAL PROTEIN: 5.7 g/dL — AB (ref 6.5–8.1)

## 2016-06-09 LAB — CBC
HEMATOCRIT: 33.2 % — AB (ref 36.0–46.0)
Hemoglobin: 11 g/dL — ABNORMAL LOW (ref 12.0–15.0)
MCH: 28.9 pg (ref 26.0–34.0)
MCHC: 33.1 g/dL (ref 30.0–36.0)
MCV: 87.1 fL (ref 78.0–100.0)
Platelets: 272 10*3/uL (ref 150–400)
RBC: 3.81 MIL/uL — ABNORMAL LOW (ref 3.87–5.11)
RDW: 13.5 % (ref 11.5–15.5)
WBC: 14.8 10*3/uL — AB (ref 4.0–10.5)

## 2016-06-09 LAB — PHOSPHORUS: Phosphorus: 3.2 mg/dL (ref 2.5–4.6)

## 2016-06-09 LAB — PROCALCITONIN

## 2016-06-09 LAB — LEGIONELLA PNEUMOPHILA SEROGP 1 UR AG: L. PNEUMOPHILA SEROGP 1 UR AG: NEGATIVE

## 2016-06-09 LAB — MAGNESIUM: MAGNESIUM: 2.2 mg/dL (ref 1.7–2.4)

## 2016-06-09 MED ORDER — LEVOFLOXACIN 750 MG PO TABS
750.0000 mg | ORAL_TABLET | Freq: Every day | ORAL | Status: DC
  Administered 2016-06-09: 750 mg via ORAL
  Filled 2016-06-09 (×2): qty 1

## 2016-06-09 MED ORDER — IPRATROPIUM-ALBUTEROL 0.5-2.5 (3) MG/3ML IN SOLN
3.0000 mL | Freq: Three times a day (TID) | RESPIRATORY_TRACT | Status: DC
  Administered 2016-06-09: 3 mL via RESPIRATORY_TRACT
  Filled 2016-06-09: qty 3

## 2016-06-09 MED ORDER — IPRATROPIUM-ALBUTEROL 0.5-2.5 (3) MG/3ML IN SOLN
3.0000 mL | Freq: Four times a day (QID) | RESPIRATORY_TRACT | Status: DC
  Administered 2016-06-09 – 2016-06-10 (×5): 3 mL via RESPIRATORY_TRACT
  Filled 2016-06-09 (×5): qty 3

## 2016-06-09 MED ORDER — METHYLPREDNISOLONE SODIUM SUCC 40 MG IJ SOLR
40.0000 mg | Freq: Two times a day (BID) | INTRAMUSCULAR | Status: DC
  Administered 2016-06-09 – 2016-06-10 (×2): 40 mg via INTRAVENOUS
  Filled 2016-06-09 (×3): qty 1

## 2016-06-09 NOTE — Progress Notes (Signed)
Name: Victoria [name] MRN: [id number] DOB: [birth year]    ADMISSION DATE:  06/07/2016 CONSULTATION DATE:  12/19  REFERRING MD :  [name]  CHIEF COMPLAINT:  CAP/ asthmatic exacerbation   BRIEF PATIENT DESCRIPTION:  60 year old female w/ sig h/o allergice bronchopulmonary aspergillosis and severe persistent asthma. Admitting 12/19 for CAP (developed after ZPAK) and Asthmatic exacerbation.   SIGNIFICANT EVENTS  12/19  Presents to ED with Dyspnea  12/21  Improvement in WBC, clinically feels better  STUDIES:  CT chest 12/19 >> Lingular and left lower lobe infiltrate consistent with acute pneumonia, right middle lobe scarring, right lowe lobe nodule   HISTORY OF PRESENT ILLNESS:  60 year old female f/b [name] w/ sig h/o allergic bronchopulmonary aspergillosis (2008) and severe persistent asthma. She was last seen in our office in f/u on 12/14 after being treated for what was felt to be mild asthmatic exacerbation. Recent review of events as follows: 12/8: seen by PCP w/ "URI symptoms", increase cough, face pain, ear discomfort. Started on pred taper and given script for azith (with instructions to start if no better). [place] office contacted. Pt instructed to start the [place]; also started on Tamiflu x5d.  12/14 seen in f/u our office. Completed abx & tamiflu. Felt a little better haven taken Pred. Still had cough but felt URI symptoms: nasal congestion, ear ache sneezing better. Her CXR was clear. She did receive her third dose of Cinqair.  12/17 symptoms worse, coughing up thick/yellow/green phlegm, + chills, T 100.8. Still on pred. Treating fever w/ APAP 12/18 went to ER but went home as she didn't want to wait in ER so went home 12/19 called 911 to get thru the ER. Not feeling better. CXR w/ new LLL PNA.  PCCM asked to assist w/ care  SUBJECTIVE:  Pt reports feeling better today.  Mobilizing secretions.  Denies fevers / shortness of breath. States she doesn't want to go home until  Saturday.   VITAL SIGNS: Temp:  [97.7 F (36.5 C)-98.6 F (37 C)] 97.7 F (36.5 C) (12/21 0445) Pulse Rate:  [88-108] 88 (12/21 0445) Resp:  [16-18] 16 (12/21 0445) BP: (118-141)/(71-79) 141/79 (12/21 0445) SpO2:  [98 %-100 %] 98 % (12/21 0909) Room air   PHYSICAL EXAMINATION: General:Adult female, in chair, on room air  Neuro:  Awake, alert, oriented, follows commands  HEENT:  NCAT, TMs cloudy MMM.  Cardiovascular:  RRR no MRG Lungs: non-labored, clear on R, crackles on LLL Abdomen:  Soft, + bowel sounds non-tender  Musculoskeletal:  Equal st and bulk  Skin:  Warm and dry    Recent Labs Lab 06/06/16 1340 06/07/16 1105 06/09/16 0458  NA 137 136 137  K 3.8 3.4* 3.8  CL 101 103 107  CO2 27 23 24   BUN 12 6 10   CREATININE 0.68 0.67 0.61  GLUCOSE 151* 159* 145*    Recent Labs Lab 06/07/16 1105 06/08/16 1403 06/09/16 0458  HGB 12.5 11.9* 11.0*  HCT 36.6 35.2* 33.2*  WBC 24.1* 18.8* 14.8*  PLT 262 287 272    Recent Labs Lab 06/06/16 1340 06/07/16 1105 06/07/16 1436 06/07/16 1558 06/08/16 0004 06/08/16 0558 06/08/16 1403 06/09/16 0458  PROCALCITON  --   --  0.37 0.14  --   --   --  <0.10  WBC 25.8* 24.1*  --   --   --   --  18.8* 14.8*  LATICACIDVEN  --   --   --  4.6* 2.6* 0.9  --   --  Ct Chest Wo Contrast  Result Date: 06/07/2016 CLINICAL DATA:  History of recurrent pneumonia with shortness of Breath EXAM: CT CHEST WITHOUT CONTRAST TECHNIQUE: Multidetector CT imaging of the chest was performed following the standard protocol without IV contrast. COMPARISON:  Chest x-ray from earlier in the same day FINDINGS: Cardiovascular: Somewhat limited by the lack of IV contrast. Mild aortic calcifications are seen without aneurysmal dilatation. Mild coronary calcifications are seen. Mediastinum/Nodes: Moderate-sized hiatal hernia is identified. Both stomach and abdominal fat extend into the chest cavity. The thoracic inlet is within normal limits. No significant  hilar or mediastinal adenopathy is noted. Scattered small mediastinal lymph nodes are seen. These are likely reactive in nature. Lungs/Pleura: Apical scarring is noted bilaterally. Mild scarring is also noted in the right middle lobe stable from the prior exam. The remainder the right lung is clear with the exception of a small 5 mm nodule identified in the lower lobe laterally best seen on image 75 of series 7. This was present on the prior exam and has decreased in size consistent with some inflammatory regression. No other nodular changes are noted. The left lung is also well aerated with patchy infiltrates seen in both the lingula and to a greater degree in the lower lobe. No sizable effusion is seen. Upper Abdomen: Hiatal hernia as described above. Musculoskeletal: Degenerative changes of the thoracic spine are noted. IMPRESSION: Lingular and left lower lobe infiltrate consistent with acute pneumonia. Right middle lobe scarring stable from the prior exam related to the prior infiltrate. Right lower lobe nodule slightly decreased in the interval from the prior exam consistent with some resolution of previous infiltrate. Electronically Signed   By: Inez Catalina M.D.   On: 06/07/2016 14:45    ASSESSMENT / PLAN:  Severe Persistent Asthma with Acute Exacerbation Left Lingular & LLL PNA Bronchopulmonary Aspergillosis  CAP Asthmatic Exacerbation   Discussion: Has gotten worse in spite of out-pt treatment. Now w/ LLL infiltrate. Admitted for IV abx >already treated w/ pred, azith and tamiflu  Plan Maintain oxygen saturation >92% Follow culture data  Continue levaquin and rocephin, D3/7-10 abx Consider d/c on oral levaquin & omnicef to complete therapy  Continue Steroids, reduce to 40 mg IV Q12 Scheduled BDs Tessalon PRN ordered Mucinex BID ordered  Flutter valve  Repeat CXR in am, pending review patient may be agreeable to d/c  Noe Gens, NP-C [facility] Pgr:  [phone] or if no answer [phone] 06/09/2016, 11:57 AM

## 2016-06-09 NOTE — Progress Notes (Signed)
Duoneb changed back to QID per patient request. She felt like that would be more beneficial at this time. RT will re-assess in AM

## 2016-06-09 NOTE — Telephone Encounter (Signed)
Received PA for pt's Cinqair, completed and faxed back to insurance. Will await response.

## 2016-06-09 NOTE — Telephone Encounter (Signed)
PA received and faxed back to [facility]. Will await response.

## 2016-06-09 NOTE — Progress Notes (Signed)
PROGRESS NOTE    Victoria [name]  [id number] DOB: Oct 24, 1955 DOA: 06/07/2016 PCP: [name], DO    Brief Narrative:   60 year old female Chronic bilateral thoracic pain Scoliosis Chronic insomnia BMI 30 Allergic bronchopulmonary aspergillosis 2008 Severe persistent asthma-recent flulike and elevated IgE with intolerance to MDIs  Recently started IL-5 RAb Cinquair  Seen 12/8 PCP URI symptoms started on Pred taper azithromycin/Tamiflu  Followed up pulmonology symptomatically better 12/17's worsening symptoms and ultimately admitted 12/19 with failed outpatient therapy and treat and 4 either eosinophilic asthma + new onset lower lobe pneumonia   Assessment & Plan:   Principal Problem:   CAP (community acquired pneumonia) Active Problems:   HLD (hyperlipidemia)   Depression   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[place])   Chronic diastolic CHF (congestive heart failure), NYHA class 2 (HCC)   Dehydration, moderate   Sepsis secondary to new-onset lower lobe pneumonia in the setting of failed Rx for Community-acquired pneumonia-continue ceftriaxone and Levaquin as has failed outpatient azithromycin therapy  on contact precautions Respiratory viral panel is negative however MRSA positive  strep pneumo is negative Lactic acidosis is improved from 2.6-->0.9 She is afebrile Saline locked 12/20  Allergic bronchopulmonary aspergillosis 2008/asthma/elevated IgE with MDI intolerance  Has had 3 infusions of IL-5 Cinquair  Defer to pulmonology further management this specific issue--- I think she might be able to taper off steroids slowly today but once again would ask pulmonology to make that call  Continue Solu-Medrol 60 every 6 with taper as per pulmonology, continue albuterol neb 2.5 every 2 when necessary Tussionex 5 no every 12 when necessary DuoNeb 3 4 times a day scheduled and Singulair 10 mg daily at bedtime, Allegra 180 when necessary allergy, Flonase 50 g nostril daily  Bilateral  thoracic pain and scoliosis Possible restrictive component breathing Monitor as outpatient  impaired glucose tolerance from steroids Monitor and may need to initiate as an out patient diabetic treatment if A1c above 6.5  History of chronic diastolic heart failure EF 60-65 percent Lasix 20 mg when necessary daily on hold-and resume on discharge  Depression continue Effexor  112.5 mgrams daily   DVT prophylaxis: Lovenox Code Status: Full Family Communication: None Disposition Plan: Expect slow taper off of steroids as well as slow transition to antibiotics by mouth as patient has had a complicated infections in the past necessitating ICU level care and expect will need at least another 2 midnight prior to discharge eventually to home  Consultants:   pulmonary   Procedures:    Antimicrobials:  Ceftriaxone 12/20  Levaquin [was on azithro op and failed] 12/20    Subjective:  Better Got up and went to get coffee and did not feel too winded Still has some cough at night and used to test next with good relief No chest pain  Objective: Vitals:   06/08/16 1951 06/08/16 2151 06/09/16 0445 06/09/16 0909  BP:  125/71 (!) 141/79   Pulse:  97 88   Resp:  16 16   Temp:  98.6 F (37 C) 97.7 F (36.5 C)   TempSrc:  Oral Oral   SpO2: 100% 98% 99% 98%  Weight:      Height:        Intake/Output Summary (Last 24 hours) at 06/09/16 1029 Last data filed at 06/09/16 0848  Gross per 24 hour  Intake              960 ml  Output  0 ml  Net              960 ml   Filed Weights   06/08/16 0400  Weight: 75.1 kg (165 lb 8 oz)    Examination:  General exam: Appears calm and comfortable  Respiratory system: Clear to auscultation. Respiratory effort normal. percucssion noteBetter on the right ostia side with mild wheeze Cardiovascular system: S1 & S2 heard, RRR. No JVD, murmurs, rubs, gallops or clicks. No pedal edema. Gastrointestinal system: Abdomen is nondistended,  soft and nontender. No organomegaly  Central nervous system: Alert and oriented. No focal neurological deficits. Extremities: Symmetric 5 x 5 power. Skin: No rashes, lesions or ulcers Psychiatry: Judgement insight normal. Mood appropriate.     Data Reviewed: I have personally reviewed following labs and imaging studies  CBC:  Recent Labs Lab 06/06/16 1340 06/07/16 1105 06/08/16 1403 06/09/16 0458  WBC 25.8* 24.1* 18.8* 14.8*  NEUTROABS 23.7* 22.6*  --   --   HGB 13.7 12.5 11.9* 11.0*  HCT 39.0 36.6 35.2* 33.2*  MCV 86.1 87.1 87.6 87.1  PLT 295 262 287 [id number]   Basic Metabolic Panel:  Recent Labs Lab 06/06/16 1340 06/07/16 1105 06/07/16 1941 06/08/16 0004 06/09/16 0458  NA 137 136  --   --  137  K 3.8 3.4*  --   --  3.8  CL 101 103  --   --  107  CO2 27 23  --   --  24  GLUCOSE 151* 159*  --   --  145*  BUN 12 6  --   --  10  CREATININE 0.68 0.67  --   --  0.61  CALCIUM 9.1 9.0  --   --  8.7*  MG  --   --  2.1 2.1 2.2  PHOS  --   --   --  2.6 3.2   GFR: Estimated Creatinine Clearance: 71.8 mL/min (by C-G formula based on SCr of 0.61 mg/dL). Liver Function Tests:  Recent Labs Lab 06/09/16 0458  AST 30  ALT 54  ALKPHOS 108  BILITOT 0.4  PROT 5.7*  ALBUMIN 2.6*   No results for input(s): LIPASE, AMYLASE in the last 168 hours. No results for input(s): AMMONIA in the last 168 hours. Coagulation Profile: No results for input(s): INR, PROTIME in the last 168 hours. Cardiac Enzymes: No results for input(s): CKTOTAL, CKMB, CKMBINDEX, TROPONINI in the last 168 hours. BNP (last 3 results) No results for input(s): PROBNP in the last 8760 hours. HbA1C: No results for input(s): HGBA1C in the last 72 hours. CBG: No results for input(s): GLUCAP in the last 168 hours. Lipid Profile: No results for input(s): CHOL, HDL, LDLCALC, TRIG, CHOLHDL, LDLDIRECT in the last 72 hours. Thyroid Function Tests: No results for input(s): TSH, T4TOTAL, FREET4, T3FREE, THYROIDAB in  the last 72 hours. Anemia Panel: No results for input(s): VITAMINB12, FOLATE, FERRITIN, TIBC, IRON, RETICCTPCT in the last 72 hours. Sepsis Labs:  Recent Labs Lab 06/07/16 1436 06/07/16 1558 06/08/16 0004 06/08/16 0558 06/09/16 0458  PROCALCITON 0.37 0.14  --   --  <0.10  LATICACIDVEN  --  4.6* 2.6* 0.9  --     Recent Results (from the past 240 hour(s))  Culture, sputum-assessment     Status: None   Collection Time: 06/07/16  1:52 PM  Result Value Ref Range Status   Specimen Description EXPECTORATED SPUTUM  Final   Special Requests NONE  Final   Sputum evaluation THIS SPECIMEN IS ACCEPTABLE FOR  SPUTUM CULTURE  Final   Report Status 06/07/2016 FINAL  Final  Culture, respiratory (NON-Expectorated)     Status: None (Preliminary result)   Collection Time: 06/07/16  1:52 PM  Result Value Ref Range Status   Specimen Description EXPECTORATED SPUTUM  Final   Special Requests NONE Reflexed from [id number]  Final   Gram Stain   Final    ABUNDANT WBC PRESENT, PREDOMINANTLY PMN ABUNDANT GRAM POSITIVE COCCI IN CLUSTERS FEW GRAM NEGATIVE RODS FEW GRAM NEGATIVE COCCI IN PAIRS RARE GRAM POSITIVE RODS    Culture PENDING  Incomplete   Report Status PENDING  Incomplete  Culture, blood (routine x 2) Call MD if unable to obtain prior to antibiotics being given     Status: None (Preliminary result)   Collection Time: 06/07/16  2:47 PM  Result Value Ref Range Status   Specimen Description BLOOD RIGHT ANTECUBITAL  Final   Special Requests BOTTLES DRAWN AEROBIC AND ANAEROBIC 5CC  Final   Culture NO GROWTH 2 DAYS  Final   Report Status PENDING  Incomplete  Culture, blood (routine x 2) Call MD if unable to obtain prior to antibiotics being given     Status: None (Preliminary result)   Collection Time: 06/07/16  2:50 PM  Result Value Ref Range Status   Specimen Description BLOOD LEFT FOREARM  Final   Special Requests IN PEDIATRIC BOTTLE  2CC  Final   Culture NO GROWTH < 24 HOURS  Final   Report  Status PENDING  Incomplete  MRSA PCR Screening     Status: Abnormal   Collection Time: 06/07/16  6:31 PM  Result Value Ref Range Status   MRSA by PCR POSITIVE (A) NEGATIVE Final    Comment:        The GeneXpert MRSA Assay (FDA approved for NASAL specimens only), is one component of a comprehensive MRSA colonization surveillance program. It is not intended to diagnose MRSA infection nor to guide or monitor treatment for MRSA infections. RESULT CALLED TO, READ BACK BY AND VERIFIED WITH: L.ABIERA,RN AT 2203,06/07/16   Respiratory Panel by PCR     Status: None   Collection Time: 06/07/16  7:14 PM  Result Value Ref Range Status   Adenovirus NOT DETECTED NOT DETECTED Final   Coronavirus 229E NOT DETECTED NOT DETECTED Final   Coronavirus HKU1 NOT DETECTED NOT DETECTED Final   Coronavirus NL63 NOT DETECTED NOT DETECTED Final   Coronavirus OC43 NOT DETECTED NOT DETECTED Final   Metapneumovirus NOT DETECTED NOT DETECTED Final   Rhinovirus / Enterovirus NOT DETECTED NOT DETECTED Final   Influenza A NOT DETECTED NOT DETECTED Final   Influenza B NOT DETECTED NOT DETECTED Final   Parainfluenza Virus 1 NOT DETECTED NOT DETECTED Final   Parainfluenza Virus 2 NOT DETECTED NOT DETECTED Final   Parainfluenza Virus 3 NOT DETECTED NOT DETECTED Final   Parainfluenza Virus 4 NOT DETECTED NOT DETECTED Final   Respiratory Syncytial Virus NOT DETECTED NOT DETECTED Final   Bordetella pertussis NOT DETECTED NOT DETECTED Final   Chlamydophila pneumoniae NOT DETECTED NOT DETECTED Final   Mycoplasma pneumoniae NOT DETECTED NOT DETECTED Final         Radiology Studies: Dg Chest 2 View  Result Date: 06/07/2016 CLINICAL DATA:  Fever and chills.  Cough EXAM: CHEST  2 VIEW COMPARISON:  June 06, 2016 FINDINGS: There is patchy airspace consolidation in the left lower lobe. There is mild scarring in both upper lobe regions. There is central peribronchial thickening consistent with chronic bronchitis,  stable. Heart size and pulmonary vascularity are normal. There is a focal hiatal hernia. No evident bone lesions. No adenopathy. IMPRESSION: Airspace consolidation left lower lobe. Chronic bronchitis. Areas of scattered scarring. Cardiac silhouette within normal limits. Hiatal hernia present. Electronically Signed   By: Lowella Grip III M.D.   On: 06/07/2016 11:22   Ct Chest Wo Contrast  Result Date: 06/07/2016 CLINICAL DATA:  History of recurrent pneumonia with shortness of Breath EXAM: CT CHEST WITHOUT CONTRAST TECHNIQUE: Multidetector CT imaging of the chest was performed following the standard protocol without IV contrast. COMPARISON:  Chest x-ray from earlier in the same day FINDINGS: Cardiovascular: Somewhat limited by the lack of IV contrast. Mild aortic calcifications are seen without aneurysmal dilatation. Mild coronary calcifications are seen. Mediastinum/Nodes: Moderate-sized hiatal hernia is identified. Both stomach and abdominal fat extend into the chest cavity. The thoracic inlet is within normal limits. No significant hilar or mediastinal adenopathy is noted. Scattered small mediastinal lymph nodes are seen. These are likely reactive in nature. Lungs/Pleura: Apical scarring is noted bilaterally. Mild scarring is also noted in the right middle lobe stable from the prior exam. The remainder the right lung is clear with the exception of a small 5 mm nodule identified in the lower lobe laterally best seen on image 75 of series 7. This was present on the prior exam and has decreased in size consistent with some inflammatory regression. No other nodular changes are noted. The left lung is also well aerated with patchy infiltrates seen in both the lingula and to a greater degree in the lower lobe. No sizable effusion is seen. Upper Abdomen: Hiatal hernia as described above. Musculoskeletal: Degenerative changes of the thoracic spine are noted. IMPRESSION: Lingular and left lower lobe infiltrate  consistent with acute pneumonia. Right middle lobe scarring stable from the prior exam related to the prior infiltrate. Right lower lobe nodule slightly decreased in the interval from the prior exam consistent with some resolution of previous infiltrate. Electronically Signed   By: Inez Catalina M.D.   On: 06/07/2016 14:45        Scheduled Meds: . aspirin  81 mg Oral Daily  . cefTRIAXone (ROCEPHIN)  IV  1 g Intravenous Q24H  . Chlorhexidine Gluconate Cloth  6 each Topical Q0600  . cholecalciferol  2,000 Units Oral Daily  . enoxaparin (LOVENOX) injection  40 mg Subcutaneous Q24H  . fluticasone  2 spray Each Nare Daily  . guaiFENesin  1,200 mg Oral BID  . ipratropium-albuterol  3 mL Nebulization TID  . levofloxacin (LEVAQUIN) IV  750 mg Intravenous Q24H  . loratadine  10 mg Oral Daily  . methylPREDNISolone (SOLU-MEDROL) injection  125 mg Intravenous Once  . methylPREDNISolone (SOLU-MEDROL) injection  60 mg Intravenous Q6H  . montelukast  10 mg Oral QHS  . mupirocin ointment  1 application Nasal BID  . pantoprazole  40 mg Oral Daily  . venlafaxine XR  112.5 mg Oral Q breakfast   Continuous Infusions:    LOS: 1 day    Time spent: [place], [place], MD [facility] Pager [phone]  If 7PM-7AM, please contact night-coverage [email or web address] Password [facility] 06/09/2016, 10:29 AM

## 2016-06-10 ENCOUNTER — Inpatient Hospital Stay (HOSPITAL_COMMUNITY): Payer: PPO

## 2016-06-10 DIAGNOSIS — E860 Dehydration: Secondary | ICD-10-CM

## 2016-06-10 DIAGNOSIS — F329 Major depressive disorder, single episode, unspecified: Secondary | ICD-10-CM

## 2016-06-10 LAB — CBC
HCT: 34.3 % — ABNORMAL LOW (ref 36.0–46.0)
Hemoglobin: 11.2 g/dL — ABNORMAL LOW (ref 12.0–15.0)
MCH: 29.2 pg (ref 26.0–34.0)
MCHC: 32.7 g/dL (ref 30.0–36.0)
MCV: 89.3 fL (ref 78.0–100.0)
PLATELETS: 277 10*3/uL (ref 150–400)
RBC: 3.84 MIL/uL — ABNORMAL LOW (ref 3.87–5.11)
RDW: 14 % (ref 11.5–15.5)
WBC: 11.1 10*3/uL — AB (ref 4.0–10.5)

## 2016-06-10 LAB — PHOSPHORUS: PHOSPHORUS: 3.6 mg/dL (ref 2.5–4.6)

## 2016-06-10 LAB — BASIC METABOLIC PANEL
Anion gap: 7 (ref 5–15)
BUN: 10 mg/dL (ref 6–20)
CALCIUM: 8.5 mg/dL — AB (ref 8.9–10.3)
CO2: 26 mmol/L (ref 22–32)
CREATININE: 0.63 mg/dL (ref 0.44–1.00)
Chloride: 105 mmol/L (ref 101–111)
GFR calc Af Amer: >60 mL/min (ref >60)
GLUCOSE: 148 mg/dL — AB (ref 65–99)
POTASSIUM: 3.5 mmol/L (ref 3.5–5.1)
SODIUM: 138 mmol/L (ref 135–145)

## 2016-06-10 LAB — CULTURE, RESPIRATORY W GRAM STAIN

## 2016-06-10 LAB — CULTURE, RESPIRATORY

## 2016-06-10 LAB — MAGNESIUM: MAGNESIUM: 2.2 mg/dL (ref 1.7–2.4)

## 2016-06-10 MED ORDER — LINEZOLID 100 MG/5ML PO SUSR: 600.0000 mg | Freq: Two times a day (BID) | ORAL | 0 refills | Status: DC

## 2016-06-10 MED ORDER — BENZONATATE 100 MG PO CAPS: 100.0000 mg | ORAL_CAPSULE | Freq: Three times a day (TID) | ORAL | 0 refills | Status: DC | PRN

## 2016-06-10 MED ORDER — LINEZOLID 600 MG PO TABS: 600.0000 mg | ORAL_TABLET | Freq: Two times a day (BID) | ORAL | 0 refills | Status: DC

## 2016-06-10 MED ORDER — PREDNISONE 10 MG PO TABS: ORAL_TABLET | ORAL | 0 refills | Status: DC

## 2016-06-10 MED ORDER — LINEZOLID 600 MG PO TABS
600.0000 mg | ORAL_TABLET | Freq: Two times a day (BID) | ORAL | Status: DC
  Administered 2016-06-10: 600 mg via ORAL
  Filled 2016-06-10 (×2): qty 1

## 2016-06-10 MED ORDER — GUAIFENESIN ER 600 MG PO TB12: 1200.0000 mg | ORAL_TABLET | Freq: Two times a day (BID) | ORAL | 1 refills | Status: DC | PRN

## 2016-06-10 NOTE — Progress Notes (Signed)
Victoria [name] to be D/C'd  per MD order. Discussed with the patient and all questions fully answered.  VSS, Skin clean, dry and intact without evidence of skin break down, no evidence of skin tears noted.  IV catheter discontinued intact. Site without signs and symptoms of complications. Dressing and pressure applied.  An After Visit Summary was printed and given to the patient. Patient received prescription.  D/c education completed with patient/family including follow up instructions, medication list, d/c activities limitations if indicated, with other d/c instructions as indicated by MD - patient able to verbalize understanding, all questions fully answered.   Patient instructed to return to ED, call 911, or call MD for any changes in condition.   Patient to be escorted via [place], and D/C home via private auto.

## 2016-06-10 NOTE — Progress Notes (Signed)
Name: Victoria [name] MRN: [id number] DOB: Oct 18, 1955    ADMISSION DATE:  06/07/2016 CONSULTATION DATE:  12/19  REFERRING MD :  [name]  CHIEF COMPLAINT:  CAP/ asthmatic exacerbation   BRIEF PATIENT DESCRIPTION:  60 year old female w/ sig h/o allergice bronchopulmonary aspergillosis and severe persistent asthma. Admitting 12/19 for CAP (developed after ZPAK) and Asthmatic exacerbation.    HISTORY OF PRESENT ILLNESS:  60 year old female f/b [name] w/ sig h/o allergic bronchopulmonary aspergillosis (2008) and severe persistent asthma. She was last seen in our office in f/u on 12/14 after being treated for what was felt to be mild asthmatic exacerbation. Recent review of events as follows: 12/8: seen by PCP w/ "URI symptoms", increase cough, face pain, ear discomfort. Started on pred taper and given script for azith (with instructions to start if no better). [facility] office contacted. Pt instructed to start the [place]; also started on Tamiflu x5d.  12/14 seen in f/u our office. Completed abx & tamiflu. Felt a little better haven taken Pred. Still had cough but felt URI symptoms: nasal congestion, ear ache sneezing better. Her CXR was clear. She did receive her third dose of Cinqair.  12/17 symptoms worse, coughing up thick/yellow/green phlegm, + chills, T 100.8. Still on pred. Treating fever w/ APAP 12/18 went to ER but went home as she didn't want to wait in ER so went home 12/19 called 911 to get thru the ER. Not feeling better. CXR w/ new LLL PNA.  PCCM asked to assist w/ care  SUBJECTIVE:   Sputum culture growing MRSA.  Pt reports feeling much better, ready to go home. Denies chest tightness, SOB, fevers, chills, n/v/d.  Ambulating frequently.   VITAL SIGNS: Temp:  [97.9 F (36.6 C)-98.4 F (36.9 C)] 97.9 F (36.6 C) (12/22 0543) Pulse Rate:  [72-95] 72 (12/22 0543) Resp:  [16-18] 16 (12/22 0543) BP: (120-136)/(67-80) 136/75 (12/22 0543) SpO2:  [98 %-100 %] 98 % (12/22  1206) Room air   PHYSICAL EXAMINATION: General: Adult female, in chair, on room air  Neuro:  Awake, alert, oriented, follows commands  HEENT:  NCAT, TMs cloudy MMM.  Cardiovascular:  RRR no MRG Lungs: non-labored, clear on R, improved LLL breath sounds, faint crackles Abdomen:  Soft, + bowel sounds non-tender  Musculoskeletal:  Equal st and bulk  Skin:  Warm and dry    Recent Labs Lab 06/07/16 1105 06/09/16 0458 06/10/16 0338  NA 136 137 138  K 3.4* 3.8 3.5  CL 103 107 105  CO2 23 24 26   BUN 6 10 10   CREATININE 0.67 0.61 0.63  GLUCOSE 159* 145* 148*    Recent Labs Lab 06/08/16 1403 06/09/16 0458 06/10/16 0338  HGB 11.9* 11.0* 11.2*  HCT 35.2* 33.2* 34.3*  WBC 18.8* 14.8* 11.1*  PLT 287 272 277    Recent Labs Lab 06/07/16 1105 06/07/16 1436 06/07/16 1558 06/08/16 0004 06/08/16 0558 06/08/16 1403 06/09/16 0458 06/10/16 0338  PROCALCITON  --  0.37 0.14  --   --   --  <0.10  --   WBC 24.1*  --   --   --   --  18.8* 14.8* 11.1*  LATICACIDVEN  --   --  4.6* 2.6* 0.9  --   --   --     Dg Chest 2 View  Result Date: 06/10/2016 CLINICAL DATA:  Follow-up pneumonia EXAM: CHEST  2 VIEW COMPARISON:  06/07/2016 chest radiograph. FINDINGS: Stable cardiomediastinal silhouette with normal heart size. No pneumothorax. No  pleural effusion. Stable Bochdalek's hernia on the left. Patchy lingular and left lower lobe consolidation is not appreciably changed. Stable biapical pleural-parenchymal scarring. No pulmonary edema. No new consolidative airspace disease. IMPRESSION: No appreciable change in patchy lingular and left lower lobe consolidation compatible with multifocal pneumonia. Recommend follow-up chest imaging to resolution. Electronically Signed   By: Ilona Sorrel M.D.   On: 06/10/2016 07:39   SIGNIFICANT EVENTS  12/19  Presents to ED with Dyspnea  12/21  Improvement in WBC, clinically feels better  STUDIES:  CT chest 12/19 >> Lingular and left lower lobe infiltrate  consistent with acute pneumonia, right middle lobe scarring, right lowe lobe nodule   CULTURES:  RVP 12/19 >> negative  Sputum 12/19 >> MRSA  BCx2 12/19 >>   ASSESSMENT / PLAN:  Severe Persistent Asthma with Acute Exacerbation Left Lingular & LLL MRSA PNA Bronchopulmonary Aspergillosis  CAP Asthmatic Exacerbation   Discussion: Has gotten worse in spite of out-pt treatment. Now w/ LLL infiltrate. Admitted for IV abx >already treated w/ pred, azith and tamiflu  Plan Maintain oxygen saturation >92% Follow culture data, review blood cultures in office 1/9 to ensure no bacteremia D4/14 abx D/C on Zyvox for 14 days total therapy Prednisone taper to off > 40 mg x1, 30 mg x1, 20mg  x1, then 10 mg x1  Resume home bronchodilators  Continue flutter valve at home Tessalon PRN ordered Mucinex BID ordered  Follow up with pulmonary as arranged with follow up CXR  Ok from pulmonary standpoint for discharge.    Noe Gens, NP-C [facility] Pgr: [phone] or if no answer [phone] 06/10/2016, 1:43 PM

## 2016-06-10 NOTE — Discharge Summary (Signed)
Physician Discharge Summary  Victoria [name] [id number] DOB: 1956/03/31 DOA: 06/07/2016  PCP: [name], DO  Admit date: 06/07/2016 Discharge date: 06/10/2016  Admitted From: home Disposition:  Home   Recommendations for Outpatient Follow-up:  1. Follow closely for serotonin syndrome with Zyvox and SSRI   Discharge Condition:  Stable   CODE STATUS:  Full code   Diet recommendation:  Heart healthy Consultations:  pulmonary    Discharge Diagnoses:  Principal Problem:   CAP (community acquired pneumonia) Active Problems:   HLD (hyperlipidemia)   Depression   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HCC)   Chronic diastolic CHF (congestive heart failure), NYHA class 2 (HCC)   Dehydration, moderate    Subjective: Cough and congestion improving. Sputum is more 10 now. No chest pain. No dyspnea.  HPI: Victoria [name] is a 60 y.o. female with medical history significant for eosinophilic asthma, dyslipidemia, anxiety and depression, and chronic diastolic congestive heart failure. Patient reports that in the past 12 months she has had at least 8 steroid tapers for asthma exacerbations. She has recently been started on IV Cinquair monthly to treat her eosinophilic asthma. Patient initially developed URI symptoms on Wednesday, December 6 with rapid progression in symptoms and by the 8th was given prednisone taper and macrolide antibiotic (Zmax) by her PCP. Unfortunately her symptoms worsened again and she was prescribed Tamiflu but reports a formal influenza test was not obtained. She completed all the medications prescribed without any improvement of her symptoms and actually had worsening of symptoms. She apparently presented to the [facility] emergency department for evaluation but based on documentation it appears the patient left before being seen by the ER physician. This morning patient's pulmonologist documented that he had spoken to the ER physician on 12/18 with recommendation to pursue CT of the  chest without contrast. Because of persistence in symptoms patient returned to [facility] and was evaluated in the ER. She was transported by EMS and was given nebulizer treatment as well as Solu-Medrol 125 mg IV 1 en route. She was quite dyspneic with ambulation but remained non-hypoxic. Chest x-ray did reveal a new left lower lobe infiltrate. ER physician did speak with the pulmonologist on call who gave recommendations regarding antibiotic selection and recommended internal medicine admission.  Hospital Course:  Severe persistent asthma-allergic bronchopulmonary aspergillosis Left lingular and left lower lobe MRSA pneumonia - Sputum culture 12/19 has grown MRSA, respiratory viral panel negative, blood cultures negative -Pulmonary has evaluated her and recommends total 2 weeks of Zyvox and subsequent outpatient follow-up -Pulmonary has also recommended a short prednisone taper -The patient states that her symptoms of cough and congestion and dyspnea have improved significantly  Depression  -Effexor  Chronic dCHF - using Lasix PRN  Discharge Instructions  Discharge Instructions    Diet - low sodium heart healthy    Complete by:  As directed    Increase activity slowly    Complete by:  As directed      Allergies as of 06/10/2016      Reactions   Beclomethasone Dipropionate Hives, Other (See Comments)    weight gain   Budesonide-formoterol Fumarate Hives   Mometasone Furo-formoterol Fum Hives, Other (See Comments)   weight gain   Sulfonamide Derivatives Hives, Rash   Pulmicort [budesonide]    Headaches   Statins    Myalgias, RLS      Medication List    STOP taking these medications   dextromethorphan-guaiFENesin 30-600 MG 12hr tablet Commonly known as:  MUCINEX DM   HYDROcodone-homatropine  5-1.5 MG/5ML syrup Commonly known as:  HYCODAN     TAKE these medications   ALPRAZolam 0.5 MG tablet Commonly known as:  XANAX Take 1 tablet (0.5 mg total) by mouth 2 (two) times  daily as needed for anxiety (Palpatations, SOB).   aspirin 81 MG tablet Take 81 mg by mouth daily.   benzonatate 100 MG capsule Commonly known as:  TESSALON Take 1 capsule (100 mg total) by mouth 3 (three) times daily as needed for cough.   chlorpheniramine-HYDROcodone 10-8 MG/5ML Suer Commonly known as:  TUSSIONEX PENNKINETIC ER Take 5 mLs by mouth every 12 (twelve) hours as needed for cough.   CO Q 10 PO Take 200 mg by mouth 2 (two) times daily.   cyclobenzaprine 10 MG tablet Commonly known as:  FLEXERIL Take 1 tablet (10 mg total) by mouth 3 (three) times daily as needed for muscle spasms.   fexofenadine 180 MG tablet Commonly known as:  ALLEGRA Take 180 mg by mouth daily as needed for allergies.   fluticasone 50 MCG/ACT nasal spray Commonly known as:  FLONASE Place 2 sprays into both nostrils daily.   furosemide 20 MG tablet Commonly known as:  LASIX Take 1 tablet (20 mg total) by mouth daily as needed.   guaiFENesin 600 MG 12 hr tablet Commonly known as:  MUCINEX Take 2 tablets (1,200 mg total) by mouth 2 (two) times daily as needed.   ibuprofen 200 MG tablet Commonly known as:  ADVIL,MOTRIN Take 200 mg by mouth every 6 (six) hours as needed for mild pain or moderate pain.   ipratropium-albuterol 0.5-2.5 (3) MG/3ML Soln Commonly known as:  DUONEB INHALE THE CONTENTS OF 1 VIAL VIA NEBULIZER UP TO 4 TO 6 TIMES A DAY.   linezolid 100 MG/5ML suspension Commonly known as:  ZYVOX Take 30 mLs (600 mg total) by mouth 2 (two) times daily. Will need total 10 days   Melatonin 1 MG Subl Take it nightly   montelukast 10 MG tablet Commonly known as:  SINGULAIR TAKE 1 TABLET BY MOUTH once daily   mupirocin nasal ointment 2 % Commonly known as:  BACTROBAN Place 1 application into the nose 2 (two) times daily. Use one-half of tube in each nostril twice daily for five (5) days. After application, press sides of nose together and gently massage.   nitroGLYCERIN 0.4 MG SL  tablet Commonly known as:  NITROSTAT Place 1 tablet (0.4 mg total) under the tongue every 5 (five) minutes as needed for chest pain.   OMEGA-3 + VITAMIN D3 ULTRA STR Liqd Take 1 Units by mouth daily. 1 teaspoon daily- 1600mg  omega 3   OVER THE COUNTER MEDICATION Take 1 capsule by mouth daily. Tumeric   predniSONE 10 MG tablet Commonly known as:  DELTASONE 40 mg a day for 1 days, 30 mg a day for 1 days, 20 mg a day for 1 days, then 10 mg for 1 days. What changed:  additional instructions  Another medication with the same name was removed. Continue taking this medication, and follow the directions you see here.   PROBIOTIC-10 PO Take 1 tablet by mouth daily.   tiotropium 18 MCG inhalation capsule Commonly known as:  SPIRIVA HANDIHALER INHALE THE CONTENTS OF ONE CAPSULE DAILY   venlafaxine XR 75 MG 24 hr capsule Commonly known as:  EFFEXOR XR Take this tablet along with your 37.5 mg tablet daily. ( 112.5mg  daily)   venlafaxine XR 37.5 MG 24 hr capsule Commonly known as:  EFFEXOR XR Take  daily along with the 75 mg tablet. ( 112.5mg  daily)   Vitamin D 2000 units Caps Take 2,000 Units by mouth daily.   ZEGERID OTC PO Take 1 tablet by mouth daily.      Follow-up Information    Rexene Edison, NP Follow up on 06/28/2016.   Specialty:  Pulmonary Disease Why:  Appt at 9:00 AM.  Arrive at 8:45 am for CXR.    You have an appointment with Dr. Chase Caller on 07/05/16 but this will need to be rescheduled.  Please call the office at your convenience to reschedule.   Contact information: [address] [place] [address] [phone]        Deborah Opalski, DO Follow up.   Specialty:  Family Medicine Why:  must f/u in 5-7 days Contact information: [address] [place] [address] [phone]          Allergies  Allergen Reactions  . Beclomethasone Dipropionate Hives and Other (See Comments)     weight gain  . Budesonide-Formoterol Fumarate Hives  .  Mometasone Furo-Formoterol Fum Hives and Other (See Comments)    weight gain  . Sulfonamide Derivatives Hives and Rash  . Pulmicort [Budesonide]     Headaches   . Statins     Myalgias, RLS     Procedures/Studies:   Dg Chest 2 View  Result Date: 06/10/2016 CLINICAL DATA:  Follow-up pneumonia EXAM: CHEST  2 VIEW COMPARISON:  06/07/2016 chest radiograph. FINDINGS: Stable cardiomediastinal silhouette with normal heart size. No pneumothorax. No pleural effusion. Stable Bochdalek's hernia on the left. Patchy lingular and left lower lobe consolidation is not appreciably changed. Stable biapical pleural-parenchymal scarring. No pulmonary edema. No new consolidative airspace disease. IMPRESSION: No appreciable change in patchy lingular and left lower lobe consolidation compatible with multifocal pneumonia. Recommend follow-up chest imaging to resolution. Electronically Signed   By: Ilona Sorrel M.D.   On: 06/10/2016 07:39   Dg Chest 2 View  Result Date: 06/07/2016 CLINICAL DATA:  Fever and chills.  Cough EXAM: CHEST  2 VIEW COMPARISON:  June 06, 2016 FINDINGS: There is patchy airspace consolidation in the left lower lobe. There is mild scarring in both upper lobe regions. There is central peribronchial thickening consistent with chronic bronchitis, stable. Heart size and pulmonary vascularity are normal. There is a focal hiatal hernia. No evident bone lesions. No adenopathy. IMPRESSION: Airspace consolidation left lower lobe. Chronic bronchitis. Areas of scattered scarring. Cardiac silhouette within normal limits. Hiatal hernia present. Electronically Signed   By: Lowella Grip III M.D.   On: 06/07/2016 11:22   Dg Chest 2 View  Result Date: 06/06/2016 CLINICAL DATA:  Productive cough . EXAM: CHEST  2 VIEW COMPARISON:  06/02/2016.  09/28/2015.  12/04/2014. FINDINGS: Mediastinum and hilar structures normal. Left lower lobe infiltrate consistent pneumonia. No pleural effusion or pneumothorax.  Stable calcified pulmonary nodule right lung base consistent granuloma. Stable bilateral pleural-parenchymal thickening noted consistent with scarring . Sliding hiatal hernia. Prior anterior abdominal wall hernia repair. IMPRESSION: 1. Left lower lobe infiltrate consistent with scarring. 2. 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 Electronically Signed   By: Marcello Moores  Register   On: 06/06/2016 13:46   Dg Chest 2 View  Result Date: 06/02/2016 CLINICAL DATA:  Cough and congestion for 1 week EXAM: CHEST  2 VIEW COMPARISON:  09/28/2015 FINDINGS: Hiatal hernia is again identified and stable. Cardiac shadow is stable. Mild interstitial changes are again seen some accentuated by the technique. Stable nodule is  noted overlying the right lung base. No new focal infiltrate or sizable effusion is seen. Changes consistent with the known diaphragmatic hernia are noted with posterior left lower lobe soft tissue density. IMPRESSION: No acute abnormality seen. Electronically Signed   By: Inez Catalina M.D.   On: 06/02/2016 11:16   Ct Chest Wo Contrast  Result Date: 06/07/2016 CLINICAL DATA:  History of recurrent pneumonia with shortness of Breath EXAM: CT CHEST WITHOUT CONTRAST TECHNIQUE: Multidetector CT imaging of the chest was performed following the standard protocol without IV contrast. COMPARISON:  Chest x-ray from earlier in the same day FINDINGS: Cardiovascular: Somewhat limited by the lack of IV contrast. Mild aortic calcifications are seen without aneurysmal dilatation. Mild coronary calcifications are seen. Mediastinum/Nodes: Moderate-sized hiatal hernia is identified. Both stomach and abdominal fat extend into the chest cavity. The thoracic inlet is within normal limits. No significant hilar or mediastinal adenopathy is noted. Scattered small mediastinal lymph nodes are seen. These are likely reactive in nature. Lungs/Pleura: Apical scarring is noted bilaterally. Mild  scarring is also noted in the right middle lobe stable from the prior exam. The remainder the right lung is clear with the exception of a small 5 mm nodule identified in the lower lobe laterally best seen on image 75 of series 7. This was present on the prior exam and has decreased in size consistent with some inflammatory regression. No other nodular changes are noted. The left lung is also well aerated with patchy infiltrates seen in both the lingula and to a greater degree in the lower lobe. No sizable effusion is seen. Upper Abdomen: Hiatal hernia as described above. Musculoskeletal: Degenerative changes of the thoracic spine are noted. IMPRESSION: Lingular and left lower lobe infiltrate consistent with acute pneumonia. Right middle lobe scarring stable from the prior exam related to the prior infiltrate. Right lower lobe nodule slightly decreased in the interval from the prior exam consistent with some resolution of previous infiltrate. Electronically Signed   By: Inez Catalina M.D.   On: 06/07/2016 14:45       Discharge Exam: Vitals:   06/10/16 0543 06/10/16 1431  BP: 136/75 114/71  Pulse: 72 87  Resp: 16 17  Temp: 97.9 F (36.6 C) 98.5 F (36.9 C)   Vitals:   06/10/16 0543 06/10/16 0812 06/10/16 1206 06/10/16 1431  BP: 136/75   114/71  Pulse: 72   87  Resp: 16   17  Temp: 97.9 F (36.6 C)   98.5 F (36.9 C)  TempSrc: Oral   Oral  SpO2: 100% 98% 98% 99%  Weight:      Height:        General: Pt is alert, awake, not in acute distress Cardiovascular: RRR, S1/S2 +, no rubs, no gallops Respiratory: CTA bilaterally, no wheezing, no rhonchi Abdominal: Soft, NT, ND, bowel sounds + Extremities: no edema, no cyanosis    The results of significant diagnostics from this hospitalization (including imaging, microbiology, ancillary and laboratory) are listed below for reference.     Microbiology: Recent Results (from the past 240 hour(s))  Culture, sputum-assessment     Status: None    Collection Time: 06/07/16  1:52 PM  Result Value Ref Range Status   Specimen Description EXPECTORATED SPUTUM  Final   Special Requests NONE  Final   Sputum evaluation THIS SPECIMEN IS ACCEPTABLE FOR SPUTUM CULTURE  Final   Report Status 06/07/2016 FINAL  Final  Culture, respiratory (NON-Expectorated)     Status: None   Collection Time:  06/07/16  1:52 PM  Result Value Ref Range Status   Specimen Description EXPECTORATED SPUTUM  Final   Special Requests NONE Reflexed from [id number]  Final   Gram Stain   Final    ABUNDANT WBC PRESENT, PREDOMINANTLY PMN ABUNDANT GRAM POSITIVE COCCI IN CLUSTERS FEW GRAM NEGATIVE RODS FEW GRAM NEGATIVE COCCI IN PAIRS RARE GRAM POSITIVE RODS    Culture   Final    ABUNDANT METHICILLIN RESISTANT STAPHYLOCOCCUS AUREUS   Report Status 06/10/2016 FINAL  Final   Organism ID, Bacteria METHICILLIN RESISTANT STAPHYLOCOCCUS AUREUS  Final      Susceptibility   Methicillin resistant staphylococcus aureus - MIC*    CIPROFLOXACIN >=8 RESISTANT Resistant     ERYTHROMYCIN >=8 RESISTANT Resistant     GENTAMICIN <=0.5 SENSITIVE Sensitive     OXACILLIN >=4 RESISTANT Resistant     TETRACYCLINE <=1 SENSITIVE Sensitive     VANCOMYCIN <=0.5 SENSITIVE Sensitive     TRIMETH/SULFA <=10 SENSITIVE Sensitive     CLINDAMYCIN >=8 RESISTANT Resistant     RIFAMPIN <=0.5 SENSITIVE Sensitive     Inducible Clindamycin NEGATIVE Sensitive     * ABUNDANT METHICILLIN RESISTANT STAPHYLOCOCCUS AUREUS  Culture, blood (routine x 2) Call MD if unable to obtain prior to antibiotics being given     Status: None (Preliminary result)   Collection Time: 06/07/16  2:47 PM  Result Value Ref Range Status   Specimen Description BLOOD RIGHT ANTECUBITAL  Final   Special Requests BOTTLES DRAWN AEROBIC AND ANAEROBIC 5CC  Final   Culture NO GROWTH 3 DAYS  Final   Report Status PENDING  Incomplete  Culture, blood (routine x 2) Call MD if unable to obtain prior to antibiotics being given     Status: None  (Preliminary result)   Collection Time: 06/07/16  2:50 PM  Result Value Ref Range Status   Specimen Description BLOOD LEFT FOREARM  Final   Special Requests IN PEDIATRIC BOTTLE  2CC  Final   Culture NO GROWTH 3 DAYS  Final   Report Status PENDING  Incomplete  MRSA PCR Screening     Status: Abnormal   Collection Time: 06/07/16  6:31 PM  Result Value Ref Range Status   MRSA by PCR POSITIVE (A) NEGATIVE Final    Comment:        The GeneXpert MRSA Assay (FDA approved for NASAL specimens only), is one component of a comprehensive MRSA colonization surveillance program. It is not intended to diagnose MRSA infection nor to guide or monitor treatment for MRSA infections. RESULT CALLED TO, READ BACK BY AND VERIFIED WITH: L.ABIERA,RN AT 2203,06/07/16   Respiratory Panel by PCR     Status: None   Collection Time: 06/07/16  7:14 PM  Result Value Ref Range Status   Adenovirus NOT DETECTED NOT DETECTED Final   Coronavirus 229E NOT DETECTED NOT DETECTED Final   Coronavirus HKU1 NOT DETECTED NOT DETECTED Final   Coronavirus NL63 NOT DETECTED NOT DETECTED Final   Coronavirus OC43 NOT DETECTED NOT DETECTED Final   Metapneumovirus NOT DETECTED NOT DETECTED Final   Rhinovirus / Enterovirus NOT DETECTED NOT DETECTED Final   Influenza A NOT DETECTED NOT DETECTED Final   Influenza B NOT DETECTED NOT DETECTED Final   Parainfluenza Virus 1 NOT DETECTED NOT DETECTED Final   Parainfluenza Virus 2 NOT DETECTED NOT DETECTED Final   Parainfluenza Virus 3 NOT DETECTED NOT DETECTED Final   Parainfluenza Virus 4 NOT DETECTED NOT DETECTED Final   Respiratory Syncytial Virus NOT DETECTED NOT DETECTED  Final   Bordetella pertussis NOT DETECTED NOT DETECTED Final   Chlamydophila pneumoniae NOT DETECTED NOT DETECTED Final   Mycoplasma pneumoniae NOT DETECTED NOT DETECTED Final     Labs: BNP (last 3 results) No results for input(s): BNP in the last 8760 hours. Basic Metabolic Panel:  Recent Labs Lab  06/06/16 1340 06/07/16 1105 06/07/16 1941 06/08/16 0004 06/09/16 0458 06/10/16 0338  NA 137 136  --   --  137 138  K 3.8 3.4*  --   --  3.8 3.5  CL 101 103  --   --  107 105  CO2 27 23  --   --  24 26  GLUCOSE 151* 159*  --   --  145* 148*  BUN 12 6  --   --  10 10  CREATININE 0.68 0.67  --   --  0.61 0.63  CALCIUM 9.1 9.0  --   --  8.7* 8.5*  MG  --   --  2.1 2.1 2.2 2.2  PHOS  --   --   --  2.6 3.2 3.6   Liver Function Tests:  Recent Labs Lab 06/09/16 0458  AST 30  ALT 54  ALKPHOS 108  BILITOT 0.4  PROT 5.7*  ALBUMIN 2.6*   No results for input(s): LIPASE, AMYLASE in the last 168 hours. No results for input(s): AMMONIA in the last 168 hours. CBC:  Recent Labs Lab 06/06/16 1340 06/07/16 1105 06/08/16 1403 06/09/16 0458 06/10/16 0338  WBC 25.8* 24.1* 18.8* 14.8* 11.1*  NEUTROABS 23.7* 22.6*  --   --   --   HGB 13.7 12.5 11.9* 11.0* 11.2*  HCT 39.0 36.6 35.2* 33.2* 34.3*  MCV 86.1 87.1 87.6 87.1 89.3  PLT 295 262 287 272 277   Cardiac Enzymes: No results for input(s): CKTOTAL, CKMB, CKMBINDEX, TROPONINI in the last 168 hours. BNP: Invalid input(s): POCBNP CBG: No results for input(s): GLUCAP in the last 168 hours. D-Dimer No results for input(s): DDIMER in the last 72 hours. Hgb A1c No results for input(s): HGBA1C in the last 72 hours. Lipid Profile No results for input(s): CHOL, HDL, LDLCALC, TRIG, CHOLHDL, LDLDIRECT in the last 72 hours. Thyroid function studies No results for input(s): TSH, T4TOTAL, T3FREE, THYROIDAB in the last 72 hours.  Invalid input(s): FREET3 Anemia work up No results for input(s): VITAMINB12, FOLATE, FERRITIN, TIBC, IRON, RETICCTPCT in the last 72 hours. Urinalysis    Component Value Date/Time   COLORURINE AMBER (A) 11/08/2014 1311   APPEARANCEUR CLEAR 11/08/2014 1311   LABSPEC 1.022 11/08/2014 1311   PHURINE 7.5 11/08/2014 1311   GLUCOSEU NEGATIVE 11/08/2014 1311   HGBUR NEGATIVE 11/08/2014 1311   HGBUR negative  12/03/2009 0000   BILIRUBINUR SMALL (A) 11/08/2014 1311   BILIRUBINUR neg 11/02/2012 1400   KETONESUR 15 (A) 11/08/2014 1311   PROTEINUR 30 (A) 11/08/2014 1311   UROBILINOGEN 1.0 11/08/2014 1311   NITRITE NEGATIVE 11/08/2014 1311   LEUKOCYTESUR TRACE (A) 11/08/2014 1311   Sepsis Labs Invalid input(s): PROCALCITONIN,  WBC,  LACTICIDVEN Microbiology Recent Results (from the past 240 hour(s))  Culture, sputum-assessment     Status: None   Collection Time: 06/07/16  1:52 PM  Result Value Ref Range Status   Specimen Description EXPECTORATED SPUTUM  Final   Special Requests NONE  Final   Sputum evaluation THIS SPECIMEN IS ACCEPTABLE FOR SPUTUM CULTURE  Final   Report Status 06/07/2016 FINAL  Final  Culture, respiratory (NON-Expectorated)     Status: None  Collection Time: 06/07/16  1:52 PM  Result Value Ref Range Status   Specimen Description EXPECTORATED SPUTUM  Final   Special Requests NONE Reflexed from [id number]  Final   Gram Stain   Final    ABUNDANT WBC PRESENT, PREDOMINANTLY PMN ABUNDANT GRAM POSITIVE COCCI IN CLUSTERS FEW GRAM NEGATIVE RODS FEW GRAM NEGATIVE COCCI IN PAIRS RARE GRAM POSITIVE RODS    Culture   Final    ABUNDANT METHICILLIN RESISTANT STAPHYLOCOCCUS AUREUS   Report Status 06/10/2016 FINAL  Final   Organism ID, Bacteria METHICILLIN RESISTANT STAPHYLOCOCCUS AUREUS  Final      Susceptibility   Methicillin resistant staphylococcus aureus - MIC*    CIPROFLOXACIN >=8 RESISTANT Resistant     ERYTHROMYCIN >=8 RESISTANT Resistant     GENTAMICIN <=0.5 SENSITIVE Sensitive     OXACILLIN >=4 RESISTANT Resistant     TETRACYCLINE <=1 SENSITIVE Sensitive     VANCOMYCIN <=0.5 SENSITIVE Sensitive     TRIMETH/SULFA <=10 SENSITIVE Sensitive     CLINDAMYCIN >=8 RESISTANT Resistant     RIFAMPIN <=0.5 SENSITIVE Sensitive     Inducible Clindamycin NEGATIVE Sensitive     * ABUNDANT METHICILLIN RESISTANT STAPHYLOCOCCUS AUREUS  Culture, blood (routine x 2) Call MD if unable to  obtain prior to antibiotics being given     Status: None (Preliminary result)   Collection Time: 06/07/16  2:47 PM  Result Value Ref Range Status   Specimen Description BLOOD RIGHT ANTECUBITAL  Final   Special Requests BOTTLES DRAWN AEROBIC AND ANAEROBIC 5CC  Final   Culture NO GROWTH 3 DAYS  Final   Report Status PENDING  Incomplete  Culture, blood (routine x 2) Call MD if unable to obtain prior to antibiotics being given     Status: None (Preliminary result)   Collection Time: 06/07/16  2:50 PM  Result Value Ref Range Status   Specimen Description BLOOD LEFT FOREARM  Final   Special Requests IN PEDIATRIC BOTTLE  2CC  Final   Culture NO GROWTH 3 DAYS  Final   Report Status PENDING  Incomplete  MRSA PCR Screening     Status: Abnormal   Collection Time: 06/07/16  6:31 PM  Result Value Ref Range Status   MRSA by PCR POSITIVE (A) NEGATIVE Final    Comment:        The GeneXpert MRSA Assay (FDA approved for NASAL specimens only), is one component of a comprehensive MRSA colonization surveillance program. It is not intended to diagnose MRSA infection nor to guide or monitor treatment for MRSA infections. RESULT CALLED TO, READ BACK BY AND VERIFIED WITH: L.ABIERA,RN AT 2203,06/07/16   Respiratory Panel by PCR     Status: None   Collection Time: 06/07/16  7:14 PM  Result Value Ref Range Status   Adenovirus NOT DETECTED NOT DETECTED Final   Coronavirus 229E NOT DETECTED NOT DETECTED Final   Coronavirus HKU1 NOT DETECTED NOT DETECTED Final   Coronavirus NL63 NOT DETECTED NOT DETECTED Final   Coronavirus OC43 NOT DETECTED NOT DETECTED Final   Metapneumovirus NOT DETECTED NOT DETECTED Final   Rhinovirus / Enterovirus NOT DETECTED NOT DETECTED Final   Influenza A NOT DETECTED NOT DETECTED Final   Influenza B NOT DETECTED NOT DETECTED Final   Parainfluenza Virus 1 NOT DETECTED NOT DETECTED Final   Parainfluenza Virus 2 NOT DETECTED NOT DETECTED Final   Parainfluenza Virus 3 NOT  DETECTED NOT DETECTED Final   Parainfluenza Virus 4 NOT DETECTED NOT DETECTED Final   Respiratory Syncytial Virus NOT  DETECTED NOT DETECTED Final   Bordetella pertussis NOT DETECTED NOT DETECTED Final   Chlamydophila pneumoniae NOT DETECTED NOT DETECTED Final   Mycoplasma pneumoniae NOT DETECTED NOT DETECTED Final     Time coordinating discharge: Over 30 minutes  SIGNED:   Debbe Odea, MD  [facility] 06/10/2016, 4:50 PM Pager   If 7PM-7AM, please contact night-coverage [email or web address] Password [email or web address]

## 2016-06-12 LAB — CULTURE, BLOOD (ROUTINE X 2)
CULTURE: NO GROWTH
CULTURE: NO GROWTH

## 2016-06-14 NOTE — Telephone Encounter (Signed)
This is a duplicate message. Please see telephone encounter 06/08/16.

## 2016-06-16 NOTE — Telephone Encounter (Signed)
Will forward back to jess to follow up on next week when she returns.

## 2016-06-17 NOTE — Telephone Encounter (Signed)
Called and spoke to pt. Informed her of the approval and that she will need to call [facility] short stay and schedule her infusion dates. Pt verbalized understanding and denied any further questions or concerns at this time.

## 2016-06-17 NOTE — Telephone Encounter (Signed)
Will forward to [place] to follow up on PA.  thanks

## 2016-06-17 NOTE — Telephone Encounter (Signed)
Called [facility] and spoke with Elberta Fortis. Cinquair has been approved through 06/19/2017. Will route message back to [place] to make her aware.

## 2016-06-21 ENCOUNTER — Telehealth: Payer: Self-pay | Admitting: Internal Medicine

## 2016-06-21 MED ORDER — FLUCONAZOLE 150 MG PO TABS: 150.0000 mg | ORAL_TABLET | Freq: Every day | ORAL | 0 refills | Status: DC

## 2016-06-21 NOTE — Telephone Encounter (Signed)
Offer Diflucan 150 mg, # 5, 1 daily 

## 2016-06-21 NOTE — Telephone Encounter (Signed)
Called and spoke with pt and she stated that she was given heavy abx in the hospital and now for the last 2-3 days she has been dealing with thrush--her tongue is red and burning.  She is requesting something be called in for this.  MR is out off today---CY please advise. Thanks  Allergies  Allergen Reactions  . Beclomethasone Dipropionate Hives and Other (See Comments)     weight gain  . Budesonide-Formoterol Fumarate Hives  . Mometasone Furo-Formoterol Fum Hives and Other (See Comments)    weight gain  . Sulfonamide Derivatives Hives and Rash  . Pulmicort [Budesonide]     Headaches   . Statins     Myalgias, RLS

## 2016-06-21 NOTE — Telephone Encounter (Signed)
Called and lmom to make the pt aware of rx that has been sent to the pharmacy.  Nothing further is needed.

## 2016-06-23 NOTE — Telephone Encounter (Signed)
Received approval for pt's Cinqair. Pt aware of approval and aware to call short stay to schedule visit. Nothing further needed at this time.

## 2016-06-24 ENCOUNTER — Other Ambulatory Visit: Payer: Self-pay | Admitting: Internal Medicine

## 2016-06-26 NOTE — Assessment & Plan Note (Addendum)
She will use her Inhaler- chronic and prn, steroids, if W--> she will call.  - supportive care d/c pt

## 2016-06-27 ENCOUNTER — Encounter: Payer: Self-pay | Admitting: Internal Medicine

## 2016-06-27 NOTE — Telephone Encounter (Signed)
MR please advise. Thanks! 

## 2016-06-27 NOTE — Telephone Encounter (Signed)
Delay cinqair by 2 weeks. Victoria [name] tp call back or be seen by someone 3-7 days before the cinqair dose so we have a status update

## 2016-06-28 ENCOUNTER — Inpatient Hospital Stay (HOSPITAL_COMMUNITY): Admission: RE | Admit: 2016-06-28 | Payer: PPO | Source: Ambulatory Visit

## 2016-06-28 ENCOUNTER — Ambulatory Visit: Payer: PPO | Admitting: Adult Health

## 2016-07-01 ENCOUNTER — Telehealth: Payer: Self-pay | Admitting: Internal Medicine

## 2016-07-01 MED ORDER — CEPHALEXIN 500 MG PO CAPS: 500.0000 mg | ORAL_CAPSULE | Freq: Three times a day (TID) | ORAL | 0 refills | Status: DC

## 2016-07-01 MED ORDER — PREDNISONE 10 MG PO TABS: ORAL_TABLET | ORAL | 0 refills | Status: DC

## 2016-07-01 NOTE — Telephone Encounter (Signed)
Pt aware of MR's recommendations & voiced her understanding.  Rx sent to preferred pharmacy. Nothing further needed.

## 2016-07-01 NOTE — Telephone Encounter (Signed)
Spoke with pt, who states she came in contact with one of her son's nurse that were dx with bronchitis. Pt states she has developed prod cough with yellow mucus,post nasal drip,  temp of 100.6 & mild wheezing X 4d. Pt doing neb treatments tid with mild relief. Pt is requesting prednisone.   MR please advise. Thanks.

## 2016-07-01 NOTE — Telephone Encounter (Signed)
Take prednisone 40 mg daily x 2 days, then 20mg  daily x 2 days, then 10mg  daily x 2 days, then 5mg  daily x 2 days and stop or go to baseline dose  abx - needed  - cephalexin 500mg  tid x 5 days   Dr. Brand Males, M.D., [facility].C.P Pulmonary and Critical Care Medicine Staff Physician [place] [facility] Pager: [phone], If no answer or between  15:00h - 7:00h: call [phone]  07/01/2016 9:07 AM      Allergies  Allergen Reactions  . Beclomethasone Dipropionate Hives and Other (See Comments)     weight gain  . Budesonide-Formoterol Fumarate Hives  . Mometasone Furo-Formoterol Fum Hives and Other (See Comments)    weight gain  . Sulfonamide Derivatives Hives and Rash  . Pulmicort [Budesonide]     Headaches   . Statins     Myalgias, RLS

## 2016-07-05 ENCOUNTER — Ambulatory Visit: Payer: PPO | Admitting: Internal Medicine

## 2016-07-06 ENCOUNTER — Other Ambulatory Visit: Payer: PPO

## 2016-07-07 ENCOUNTER — Inpatient Hospital Stay: Payer: PPO | Admitting: Adult Health

## 2016-07-12 ENCOUNTER — Ambulatory Visit: Payer: PPO

## 2016-07-14 ENCOUNTER — Ambulatory Visit (INDEPENDENT_AMBULATORY_CARE_PROVIDER_SITE_OTHER): Payer: PPO | Admitting: Adult Health

## 2016-07-14 ENCOUNTER — Ambulatory Visit (HOSPITAL_BASED_OUTPATIENT_CLINIC_OR_DEPARTMENT_OTHER)
Admission: RE | Admit: 2016-07-14 | Discharge: 2016-07-14 | Disposition: A | Discharge status: Home / Self Care | Payer: PPO | Source: Ambulatory Visit | Attending: Adult Health | Admitting: Adult Health

## 2016-07-14 ENCOUNTER — Encounter: Payer: Self-pay | Admitting: Adult Health

## 2016-07-14 VITALS — BP 122/80 | HR 72 | Temp 98.5°F | Ht 62.0 in | Wt 167.0 lb

## 2016-07-14 DIAGNOSIS — J45901 Unspecified asthma with (acute) exacerbation: Secondary | ICD-10-CM | POA: Insufficient documentation

## 2016-07-14 DIAGNOSIS — J189 Pneumonia, unspecified organism: Secondary | ICD-10-CM

## 2016-07-14 DIAGNOSIS — J4550 Severe persistent asthma, uncomplicated: Secondary | ICD-10-CM | POA: Diagnosis not present

## 2016-07-14 DIAGNOSIS — R918 Other nonspecific abnormal finding of lung field: Secondary | ICD-10-CM | POA: Diagnosis not present

## 2016-07-14 DIAGNOSIS — J82 Pulmonary eosinophilia, not elsewhere classified: Secondary | ICD-10-CM

## 2016-07-14 DIAGNOSIS — J8283 Eosinophilic asthma: Secondary | ICD-10-CM

## 2016-07-14 MED ORDER — HYDROCODONE-HOMATROPINE 5-1.5 MG/5ML PO SYRP: 5.0000 mL | ORAL_SOLUTION | Freq: Four times a day (QID) | ORAL | 0 refills | Status: DC | PRN

## 2016-07-14 NOTE — Patient Instructions (Signed)
Continue on Spiriva .  Saline nasal rinses As needed   Take Allegra daily  Take Flonase daily  Chest xray today .  Follow up with Dr. Chase Caller in 4-6 weeks and As needed   Please contact office for sooner follow up if symptoms do not improve or worsen or seek emergency care

## 2016-07-14 NOTE — Assessment & Plan Note (Signed)
MRSA PNA -clinicaily improved with Zyvox (2 wk course)  Check cxr today   Plan  Patient Instructions  Continue on Spiriva .  Saline nasal rinses As needed   Take Allegra daily  Take Flonase daily  Chest xray today .  Follow up with Dr. Chase Caller in 4-6 weeks and As needed   Please contact office for sooner follow up if symptoms do not improve or worsen or seek emergency care

## 2016-07-14 NOTE — Assessment & Plan Note (Signed)
Cinquair on hold after recent MRSA PNA  Check cxr today  Discuss restart date with Dr. Chase Caller

## 2016-07-14 NOTE — Progress Notes (Signed)
@Patient  ID: Victoria [name], female    DOB: [birth year], 61 y.o.   MRN: [id number]  Chief Complaint  Patient presents with  . Hospitalization Follow-up    asthma     Referring provider: [name], DO  HPI: HPI 61 yo female with severe persistent asthma w/ ABPA plus eosinophilia    TEST  Spirometry 2015 FEV1 58%, ratio 70, FVC 67%.  FeNO 05/05/15 >80  IgE 1384 2012 IgE 1267 2013  CTA Chest 2016 neg for PE , RLL PNA , unchanged 79mm RLL nodule , biapical pleural thickening.  CT sinus 2013 Minimal mucosal disease  CT sinus 2007 left max sinusitis   07/14/2016 Post hospital follow up : Asthma /PNA  Patient presents for a one-month follow-up from recent hospitalization for asthma exacerbation with MRSA pneumonia Patient presented with persistent cough and congestion. Despite outpatient antibiotics, steroids, and antivirals. CT chest showed a lingular and left lower lobe infiltrate. Viral panel was negative. Sputum culture returned positive for MRSA. Sensitive to doxycycline and sulfa and vancomycin. She was treated with aggressive IV antibiotics and discharged on Zyvox for a total of 2 weeks of therapy . Since Discharge she is feeling better but remains weak.  Cough and congestion are less.   She has eosinophilic Asthma with frequent flares.  She remains on Spirva and Singulair . Previously on Asmanex but insurance stopped covering . She was changed to AirDuo to help with cost . This was stopped in Oct.  Recently started on Cinquair IV . She has had several steroid courses over last year due to asthma flares. Wheezing and cough are better.   Has chronic sinusitis /rhiniitis , on allegra and flonase.      Allergies  Allergen Reactions  . Beclomethasone Dipropionate Hives and Other (See Comments)     weight gain  . Budesonide-Formoterol Fumarate Hives  . Mometasone Furo-Formoterol Fum Hives and Other (See Comments)    weight gain  . Sulfonamide Derivatives Hives and  Rash  . Pulmicort [Budesonide]     Headaches   . Statins     Myalgias, RLS    Immunization History  Administered Date(s) Administered  . Influenza Split 04/20/2011  . Influenza Whole 06/06/2007, 04/15/2008, 04/02/2009, 03/29/2012  . Influenza, High Dose Seasonal PF 05/05/2015  . Influenza,inj,Quad PF,36+ Mos 05/09/2013, 03/03/2014, 05/03/2016  . Pneumococcal Conjugate-13 05/09/2013  . Pneumococcal Polysaccharide-23 05/04/2005  . Td 07/29/2009  . Tdap 03/11/2015    Past Medical History:  Diagnosis Date  . 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 Anemia    iron deficiency, resolved  . Anxiety   . Asthma   . CAD (coronary artery disease)    a. LHC 6/16:  oOM1 61, pRCA 25 >> med Rx  . CAP (community acquired pneumonia) 06/07/2016  . COPD (chronic obstructive pulmonary disease) ([place])   . Depression    mild  . Diverticulosis   . GERD (gastroesophageal reflux disease)   . H/O hiatal hernia   . History of echocardiogram    Echo 6/16:  Mod LVH, EF 60-65%, no RWMA, Gr 1 DD, trivial MR, normal LA size.  Marland Kitchen Hyperglycemia 11/20/2015  . Hyperlipidemia   . IBS (irritable bowel syndrome)   . Maxillary sinusitis   . Normal cardiac stress test 11/2011   No evidence of ischemia or infarct. Calculated ejection fraction 72%.  . Obesity   . OSA (obstructive sleep apnea) 02/2012   has stopped using  cpap  .  Osteoarthritis   . Osteoporosis   . Pneumonia 11/2011   "before 2013 I hadn't had pneumonia since I was a child" (04/13/2012)  . Pulmonary nodules   . Schatzki's ring     Tobacco History: History  Smoking Status  . Never Smoker  Smokeless Tobacco  . Never Used   Counseling given: Not Answered   Outpatient Encounter Prescriptions as of 07/14/2016  Medication Sig  . ALPRAZolam (XANAX) 0.5 MG tablet Take 1 tablet (0.5 mg total) by mouth 2 (two) times daily as needed for anxiety (Palpatations, SOB).  Marland Kitchen aspirin 81 MG tablet  Take 81 mg by mouth daily.   . benzonatate (TESSALON) 100 MG capsule Take 1 capsule (100 mg total) by mouth 3 (three) times daily as needed for cough.  . Cholecalciferol (VITAMIN D) 2000 UNITS CAPS Take 2,000 Units by mouth daily.   . Coenzyme Q10 (CO Q 10 PO) Take 200 mg by mouth 2 (two) times daily.  . cyclobenzaprine (FLEXERIL) 10 MG tablet Take 1 tablet (10 mg total) by mouth 3 (three) times daily as needed for muscle spasms.  . fexofenadine (ALLEGRA) 180 MG tablet Take 180 mg by mouth daily as needed for allergies.   . fluticasone (FLONASE) 50 MCG/ACT nasal spray Place 2 sprays into both nostrils daily.  . furosemide (LASIX) 20 MG tablet Take 1 tablet (20 mg total) by mouth daily as needed.  Marland Kitchen guaiFENesin (MUCINEX) 600 MG 12 hr tablet Take 2 tablets (1,200 mg total) by mouth 2 (two) times daily as needed.  Marland Kitchen ibuprofen (ADVIL,MOTRIN) 200 MG tablet Take 200 mg by mouth every 6 (six) hours as needed for mild pain or moderate pain.  Marland Kitchen ipratropium-albuterol (DUONEB) 0.5-2.5 (3) MG/3ML SOLN INHALE THE CONTENTS OF 1 VIAL VIA NEBULIZER UP TO 4 TO 6 TIMES A DAY.  . Melatonin 1 MG SUBL Take it nightly  . montelukast (SINGULAIR) 10 MG tablet TAKE 1 TABLET BY MOUTH once daily  . mupirocin nasal ointment (BACTROBAN) 2 % Place 1 application into the nose 2 (two) times daily. Use one-half of tube in each nostril twice daily for five (5) days. After application, press sides of nose together and gently massage.  . nitroGLYCERIN (NITROSTAT) 0.4 MG SL tablet Place 1 tablet (0.4 mg total) under the tongue every 5 (five) minutes as needed for chest pain.  . Omega-3 Fat Ac-Cholecalciferol (OMEGA-3 + VITAMIN D3 ULTRA STR) LIQD Take 1 Units by mouth daily. 1 teaspoon daily- 1600mg  omega 3  . Omeprazole-Sodium Bicarbonate (ZEGERID OTC PO) Take 1 tablet by mouth daily.  Marland Kitchen OVER THE COUNTER MEDICATION Take 1 capsule by mouth daily. Tumeric  . Probiotic Product (PROBIOTIC-10 PO) Take 1 tablet by mouth daily.  Marland Kitchen  tiotropium (SPIRIVA HANDIHALER) 18 MCG inhalation capsule INHALE THE CONTENTS OF ONE CAPSULE DAILY  . venlafaxine XR (EFFEXOR XR) 37.5 MG 24 hr capsule Take daily along with the 75 mg tablet. ( 112.5mg  daily)  . venlafaxine XR (EFFEXOR XR) 75 MG 24 hr capsule Take this tablet along with your 37.5 mg tablet daily. ( 112.5mg  daily)  . HYDROcodone-homatropine (HYDROMET) 5-1.5 MG/5ML syrup Take 5 mLs by mouth every 6 (six) hours as needed for cough.  . [DISCONTINUED] cephALEXin (KEFLEX) 500 MG capsule Take 1 capsule (500 mg total) by mouth 3 (three) times daily. (Patient not taking: Reported on 07/14/2016)  . [DISCONTINUED] chlorpheniramine-HYDROcodone (TUSSIONEX PENNKINETIC ER) 10-8 MG/5ML SUER Take 5 mLs by mouth every 12 (twelve) hours as needed for cough. (Patient not taking: Reported on  07/14/2016)  . [DISCONTINUED] fluconazole (DIFLUCAN) 150 MG tablet Take 1 tablet (150 mg total) by mouth daily. (Patient not taking: Reported on 07/14/2016)  . [DISCONTINUED] linezolid (ZYVOX) 600 MG tablet Take 1 tablet (600 mg total) by mouth 2 (two) times daily. (Patient not taking: Reported on 07/14/2016)  . [DISCONTINUED] predniSONE (DELTASONE) 10 MG tablet 40 mg a day for 1 days, 30 mg a day for 1 days, 20 mg a day for 1 days, then 10 mg for 1 days. (Patient not taking: Reported on 07/14/2016)  . [DISCONTINUED] predniSONE (DELTASONE) 10 MG tablet Take 4 tabs X 2 days, 2 tabs X 2 days, 1 tabs X 2 days, 0.5 tabs X 2 days then stop (Patient not taking: Reported on 07/14/2016)  . [DISCONTINUED] SPIRIVA HANDIHALER 18 MCG inhalation capsule INHALE THE CONTENTS OF ONE CAPSULE DAILY (Patient not taking: Reported on 07/14/2016)   No facility-administered encounter medications on file as of 07/14/2016.      Review of Systems  Constitutional:   No  weight loss, night sweats,  Fevers, chills,  +fatigue, or  lassitude.  HEENT:   No headaches,  Difficulty swallowing,  Tooth/dental problems, or  Sore throat,                No  sneezing, itching, ear ache,  +nasal congestion, post nasal drip,   CV:  No chest pain,  Orthopnea, PND, swelling in lower extremities, anasarca, dizziness, palpitations, syncope.   GI  No heartburn, indigestion, abdominal pain, nausea, vomiting, diarrhea, change in bowel habits, loss of appetite, bloody stools.   Resp:   No chest wall deformity  Skin: no rash or lesions.  GU: no dysuria, change in color of urine, no urgency or frequency.  No flank pain, no hematuria   MS:  No joint pain or swelling.  No decreased range of motion.  No back pain.    Physical Exam  BP 122/80 (BP Location: Left Arm, Cuff Size: Normal)   Pulse 72   Temp 98.5 F (36.9 C) (Oral)   Ht 5\' 2"  (1.575 m)   Wt 167 lb (75.8 kg)   SpO2 98%   BMI 30.54 kg/m   GEN: A/Ox3; pleasant , NAD, well nourished    HEENT:  [place]/AT,  EACs-clear, TMs-wnl, NOSE-clear, THROAT-clear, no lesions, no postnasal drip or exudate noted.   NECK:  Supple w/ fair ROM; no JVD; normal carotid impulses w/o bruits; no thyromegaly or nodules palpated; no lymphadenopathy.    RESP  Faint exp wheeze on forced exp  no accessory muscle use, no dullness to percussion  CARD:  RRR, no m/r/g, no peripheral edema, pulses intact, no cyanosis or clubbing.  GI:   Soft & nt; nml bowel sounds; no organomegaly or masses detected.   Musco: Warm bil, no deformities or joint swelling noted.   Neuro: alert, no focal deficits noted.    Skin: Warm, no lesions or rashes  Psych:  No change in mood or affect. No depression or anxiety.  No memory loss.  Lab Results:   ProBNP    Component Value Date/Time   PROBNP 16.0 02/25/2015 1300    Imaging: No results found.   Assessment & Plan:   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[place]) Cinquair on hold after recent MRSA PNA  Check cxr today  Discuss restart date with Dr. Chase Caller    Severe persistent asthma without complication Recent flare with MRSA PNA  Improving after steroids  Cont on current regimen    Will discuss case with. Dr.  Ramaswamy , would like to add ICS back to her regimen again.    CAP (community acquired pneumonia) MRSA PNA -clinicaily improved with Zyvox (2 wk course)  Check cxr today   Plan  Patient Instructions  Continue on Spiriva .  Saline nasal rinses As needed   Take Allegra daily  Take Flonase daily  Chest xray today .  Follow up with Dr. Chase Caller in 4-6 weeks and As needed   Please contact office for sooner follow up if symptoms do not improve or worsen or seek emergency care          Rexene Edison, NP 07/14/2016

## 2016-07-14 NOTE — Assessment & Plan Note (Signed)
Recent flare with MRSA PNA  Improving after steroids  Cont on current regimen  Will discuss case with. Dr. Chase Caller , would like to add ICS back to her regimen again.

## 2016-07-19 ENCOUNTER — Telehealth: Payer: Self-pay | Admitting: Adult Health

## 2016-07-19 ENCOUNTER — Encounter: Payer: Self-pay | Admitting: Internal Medicine

## 2016-07-19 MED ORDER — FLUTICASONE-SALMETEROL 113-14 MCG/ACT IN AEPB: 1.0000 | INHALATION_SPRAY | Freq: Two times a day (BID) | RESPIRATORY_TRACT | 5 refills | Status: DC

## 2016-07-19 NOTE — Telephone Encounter (Signed)
AirDuo was stopped for unkown reason need to restart.  rx sent to pharm.  Pt has been off [place] for >2 month . Finished abx course for PNA  May restart Cinqair .  follow up Dr. Chase Caller in 1 month as planned an As needed   Please contact office for sooner follow up if symptoms do not improve or worsen or seek emergency care  ====================

## 2016-07-19 NOTE — Telephone Encounter (Signed)
TP.  Please Advise- Please see pt. email below

## 2016-07-20 ENCOUNTER — Other Ambulatory Visit: Payer: Self-pay

## 2016-07-20 DIAGNOSIS — J189 Pneumonia, unspecified organism: Secondary | ICD-10-CM

## 2016-07-20 NOTE — Telephone Encounter (Signed)
If she is feeling otherwsie well she can resume cinqair. Which should address the eos in sinus as well  She should have a cxr again in 4 weeks which is at time of fu  With me 08/17/16  Dr. Brand Males, M.D., [facility].C.P Pulmonary and Critical Care Medicine Staff Physician [place] [facility] Pager: [phone], If no answer or between  15:00h - 7:00h: call [phone]  07/20/2016 2:08 PM

## 2016-07-20 NOTE — Telephone Encounter (Signed)
MR- please review email from the pt and advise, thanks!  ----- Message -----  From: Victoria [name]  Sent: 07/19/2016 11:21 AM EST  To: Brand Males, MD Subject: Visit Follow-Up Question  I had [facility] with [facility].Repeat chest x-ray continues to showpneumonia.I was to wait until after this visit for your approval to schedule and resume the Cinqair IV Infusions at [facility]. I am still fatigued from the pneumonia, but improving. The chest has settled down, BUThaving significant sinus issues last few weeks, which are aggravating my airways and a lot of coughing due to post-nasal drip. I am SO frustrated because my sinuses trigger asthma as well and wondering about seeing an ENT.But in reading up on sinus issues I came acrossinfo on Eosinophilic Chronic Rhinosinusitus!I'm assuming with high Eosiniphils triggering asthma that this could also be the trigger for my chronic sinus issues?So I'm very excited to resume the Cinqair and reduce eosinophils and hopefully improve my asthma and sinus issues and quality of life! So let me know if I can resume the [place].See you on 2/28. Victoria [name]

## 2016-07-21 ENCOUNTER — Encounter: Payer: Self-pay | Admitting: Adult Health

## 2016-07-21 NOTE — Telephone Encounter (Signed)
MR please advise if we can change pt's medication. Thanks.

## 2016-07-22 ENCOUNTER — Ambulatory Visit: Payer: PPO | Admitting: Internal Medicine

## 2016-07-22 MED ORDER — MOMETASONE FUROATE 220 MCG/INH IN AEPB: 2.0000 | INHALATION_SPRAY | Freq: Two times a day (BID) | RESPIRATORY_TRACT | 12 refills | Status: DC

## 2016-07-22 NOTE — Telephone Encounter (Signed)
Ok do asmanex high dose 2 puff bid  Dr. Brand Males, M.D., [facility].C.P Pulmonary and Critical Care Medicine Staff Physician [place] [facility] Pager: [phone], If no answer or between  15:00h - 7:00h: call [phone]  07/22/2016 8:42 AM

## 2016-07-25 ENCOUNTER — Other Ambulatory Visit (INDEPENDENT_AMBULATORY_CARE_PROVIDER_SITE_OTHER): Payer: PPO

## 2016-07-25 DIAGNOSIS — E782 Mixed hyperlipidemia: Secondary | ICD-10-CM

## 2016-07-26 ENCOUNTER — Other Ambulatory Visit (HOSPITAL_COMMUNITY): Payer: Self-pay | Admitting: *Deleted

## 2016-07-26 ENCOUNTER — Telehealth: Payer: Self-pay | Admitting: Internal Medicine

## 2016-07-26 DIAGNOSIS — H5203 Hypermetropia, bilateral: Secondary | ICD-10-CM | POA: Diagnosis not present

## 2016-07-26 LAB — LIPID PANEL
CHOL/HDL RATIO: 4.5 ratio — AB (ref 0.0–4.4)
Cholesterol, Total: 227 mg/dL — ABNORMAL HIGH (ref 100–199)
HDL: 51 mg/dL (ref >39)
LDL Calculated: 160 mg/dL — ABNORMAL HIGH (ref 0–99)
Triglycerides: 81 mg/dL (ref 0–149)
VLDL Cholesterol Cal: 16 mg/dL (ref 5–40)

## 2016-07-26 NOTE — Telephone Encounter (Signed)
error 

## 2016-07-27 ENCOUNTER — Ambulatory Visit (HOSPITAL_COMMUNITY)
Admission: RE | Admit: 2016-07-27 | Discharge: 2016-07-27 | Disposition: A | Discharge status: Home / Self Care | Payer: PPO | Source: Ambulatory Visit | Attending: Internal Medicine | Admitting: Internal Medicine

## 2016-07-27 DIAGNOSIS — Z5111 Encounter for antineoplastic chemotherapy: Secondary | ICD-10-CM | POA: Diagnosis not present

## 2016-07-27 DIAGNOSIS — J4550 Severe persistent asthma, uncomplicated: Secondary | ICD-10-CM | POA: Insufficient documentation

## 2016-07-27 MED ORDER — SODIUM CHLORIDE 0.9 % IV SOLN
225.0000 mg | Freq: Once | INTRAVENOUS | Status: AC
  Administered 2016-07-27: 230 mg via INTRAVENOUS
  Filled 2016-07-27: qty 23

## 2016-07-29 ENCOUNTER — Telehealth: Payer: Self-pay | Admitting: Internal Medicine

## 2016-07-29 MED ORDER — OSELTAMIVIR PHOSPHATE 75 MG PO CAPS: 75.0000 mg | ORAL_CAPSULE | Freq: Every day | ORAL | 0 refills | Status: DC

## 2016-07-29 NOTE — Telephone Encounter (Signed)
tamiflu 75mg  daily x 10 days - ensure noral renal function per hx  Dr. Brand Males, M.D., [facility].C.P Pulmonary and Critical Care Medicine Staff Physician [place] [facility] Pager: [phone], If no answer or between  15:00h - 7:00h: call [phone]  07/29/2016 3:31 PM

## 2016-07-29 NOTE — Telephone Encounter (Signed)
Spoke with pt. She is aware of MR's recommendation. Rx has been sent in. Nothing further was needed.

## 2016-07-29 NOTE — Telephone Encounter (Signed)
Spoke with pt. States that her husband was diagnosed with the flu on Wednesday. She would like to have Tamiflu for preventative measures. Pt is not currently having any symptoms.  MR - please advise. Thanks.

## 2016-08-02 ENCOUNTER — Ambulatory Visit: Payer: PPO

## 2016-08-10 ENCOUNTER — Ambulatory Visit (INDEPENDENT_AMBULATORY_CARE_PROVIDER_SITE_OTHER): Payer: PPO | Admitting: Pharmacist

## 2016-08-10 DIAGNOSIS — E782 Mixed hyperlipidemia: Secondary | ICD-10-CM

## 2016-08-10 NOTE — Patient Instructions (Signed)
I'll start paperwork for Repatha injections  I will call with an update from your insurance company

## 2016-08-10 NOTE — Progress Notes (Signed)
Patient ID: [name]                 DOB: Oct 13, 1955                    MRN: [id number]     HPI: Victoria [name] is a 61 y.o. female patient referred to lipid clinic by Dr Victoria [name] who presents today for follow up. PMH is significant for occasional chest pain associated with asthma exacerbations, CAD with 60% blockage in the ostial OM1, and obesity. At last visit 3 months ago, pt wanted to work on her diet and take Guggal (natural gum resin to lower cholesterol) before pursuing other options. Since then, LDL has dropped from 182 to 160, still above goal <70 given CAD.   Pt reports muscle aches on Crestor 10mg  3x per week, Lipitor 40mg  daily, simvastatin 40mg  daily, and Zetia 10mg  daily. Zetia was also ineffective at lowering her cholesterol. She would develop myalgias in her legs and feet. She tolerated low dose Crestor better than the other statins, but still developed similar side effects after a few months.  Pt has Medicare Part D but would be ok with a more expensive copay. She takes an expensive asthma infusion and should have catastrophic coverage relatively quickly.  Current Medications: Guggal (gum resin to lower cholesterol naturally) Intolerances: Rosuvastatin 10mg  up to 3x weekly, atorvastatin 40mg  daily, simvastatin 40mg  daily, Zetia 10mg  daily - myalgias in her legs with all Risk Factors: CAD with 60% blockage of ostial OM1  LDL goal: 70mg /dL  Diet: Eats only home cooked foods, rarely in restaurants.  Avoids fried foods as well as white carbs.  Exercise: Minimum due to asthma exacerbations  Family History: Significant for maternal grandfather with MI at age 69.  Brother died from pulmonary embolism at 49. Mother did have heart "issues," but died from breast cancer at age 26.  Social History: The patient  reports that she has never smoked. She has never used smokeless tobacco. She reports that she drinks about 2.4 oz of alcohol per week. She reports that she does not use illicit  drugs.  Labs: 07/25/16: TC 227, TG 81, HDL 51, LDL 160 (Guggal) 11/2015: TC 259, TG 125, HDL 52.1, LDL 182 (Guggal)  Past Medical History:  Diagnosis Date  . Allergic bronchopulmonary aspergillosis ([place]) 2008   sees Dr Victoria [name] pulmonology  . Anemia    iron deficiency, resolved  . Anxiety   . Asthma   . CAD (coronary artery disease)    a. LHC 6/16:  oOM1 60, pRCA 25 >> med Rx  . CAP (community acquired pneumonia) 06/07/2016  . COPD (chronic obstructive pulmonary disease) ([place])   . Depression    mild  . Diverticulosis   . GERD (gastroesophageal reflux disease)   . H/O hiatal hernia   . History of echocardiogram    Echo 6/16:  Mod LVH, EF 60-65%, no RWMA, Gr 1 DD, trivial MR, normal LA size.  Marland Kitchen Hyperglycemia 11/20/2015  . Hyperlipidemia   . IBS (irritable bowel syndrome)   . Maxillary sinusitis   . Normal cardiac stress test 11/2011   No evidence of ischemia or infarct. Calculated ejection fraction 72%.  . Obesity   . OSA (obstructive sleep apnea) 02/2012   has stopped using  cpap  . Osteoarthritis   . Osteoporosis   . Pneumonia 11/2011   "before 2013 I hadn't had pneumonia since I was a child" (04/13/2012)  . Pulmonary nodules   . Schatzki's ring  Current Outpatient Prescriptions on File Prior to Visit  Medication Sig Dispense Refill  . ALPRAZolam (XANAX) 0.5 MG tablet Take 1 tablet (0.5 mg total) by mouth 2 (two) times daily as needed for anxiety (Palpatations, SOB). 30 tablet 1  . aspirin 81 MG tablet Take 81 mg by mouth daily.     . benzonatate (TESSALON) 100 MG capsule Take 1 capsule (100 mg total) by mouth 3 (three) times daily as needed for cough. 20 capsule 0  . Cholecalciferol (VITAMIN D) 2000 UNITS CAPS Take 2,000 Units by mouth daily.     . Coenzyme Q10 (CO Q 10 PO) Take 200 mg by mouth 2 (two) times daily.    . cyclobenzaprine (FLEXERIL) 10 MG tablet Take 1 tablet (10 mg total) by mouth 3 (three) times daily as needed for muscle spasms. 30 tablet 0    . fexofenadine (ALLEGRA) 180 MG tablet Take 180 mg by mouth daily as needed for allergies.     . fluticasone (FLONASE) 50 MCG/ACT nasal spray Place 2 sprays into both nostrils daily. 16 g 1  . Fluticasone-Salmeterol (AIRDUO RESPICLICK 99991111) 0000000 MCG/ACT AEPB Inhale 1 puff into the lungs 2 (two) times daily. 1 each 5  . furosemide (LASIX) 20 MG tablet Take 1 tablet (20 mg total) by mouth daily as needed. 30 tablet 1  . guaiFENesin (MUCINEX) 600 MG 12 hr tablet Take 2 tablets (1,200 mg total) by mouth 2 (two) times daily as needed. 30 tablet 1  . HYDROcodone-homatropine (HYDROMET) 5-1.5 MG/5ML syrup Take 5 mLs by mouth every 6 (six) hours as needed for cough. 240 mL 0  . ibuprofen (ADVIL,MOTRIN) 200 MG tablet Take 200 mg by mouth every 6 (six) hours as needed for mild pain or moderate pain.    Marland Kitchen ipratropium-albuterol (DUONEB) 0.5-2.5 (3) MG/3ML SOLN INHALE THE CONTENTS OF 1 VIAL VIA NEBULIZER UP TO 4 TO 6 TIMES A DAY. 360 mL 2  . Melatonin 1 MG SUBL Take it nightly 30 tablet 0  . mometasone (ASMANEX 120 METERED DOSES) 220 MCG/INH inhaler Inhale 2 puffs into the lungs 2 (two) times daily. 1 Inhaler 12  . montelukast (SINGULAIR) 10 MG tablet TAKE 1 TABLET BY MOUTH once daily 90 tablet 1  . mupirocin nasal ointment (BACTROBAN) 2 % Place 1 application into the nose 2 (two) times daily. Use one-half of tube in each nostril twice daily for five (5) days. After application, press sides of nose together and gently massage.    . nitroGLYCERIN (NITROSTAT) 0.4 MG SL tablet Place 1 tablet (0.4 mg total) under the tongue every 5 (five) minutes as needed for chest pain. 25 tablet 0  . Omega-3 Fat Ac-Cholecalciferol (OMEGA-3 + VITAMIN D3 ULTRA STR) LIQD Take 1 Units by mouth daily. 1 teaspoon daily- 1600mg  omega 3    . Omeprazole-Sodium Bicarbonate (ZEGERID OTC PO) Take 1 tablet by mouth daily.    Marland Kitchen oseltamivir (TAMIFLU) 75 MG capsule Take 1 capsule (75 mg total) by mouth daily. 10 capsule 0  . OVER THE COUNTER  MEDICATION Take 1 capsule by mouth daily. Tumeric    . Probiotic Product (PROBIOTIC-10 PO) Take 1 tablet by mouth daily.    Marland Kitchen tiotropium (SPIRIVA HANDIHALER) 18 MCG inhalation capsule INHALE THE CONTENTS OF ONE CAPSULE DAILY 90 capsule 3  . venlafaxine XR (EFFEXOR XR) 37.5 MG 24 hr capsule Take daily along with the 75 mg tablet. ( 112.5mg  daily) 90 capsule 1  . venlafaxine XR (EFFEXOR XR) 75 MG 24 hr capsule Take  this tablet along with your 37.5 mg tablet daily. ( 112.5mg  daily) 90 capsule 1   No current facility-administered medications on file prior to visit.     Allergies  Allergen Reactions  . Beclomethasone Dipropionate Hives and Other (See Comments)     weight gain  . Budesonide-Formoterol Fumarate Hives  . Mometasone Furo-Formoterol Fum Hives and Other (See Comments)    weight gain  . Sulfonamide Derivatives Hives and Rash  . Pulmicort [Budesonide]     Headaches   . Statins     Myalgias, RLS    Assessment/Plan:  1. Hyperlipidemia - LDL has improved from 182 to 160 with lifestyle improvements, but is still above goal 70mg /dL given CAD with 60% blockage. Pt is intolerant to 3 statins and Zetia, including low dose Crestor 3 days per week. Discussed PCSK9i vs CLEAR trial with pt. She would like to pursue PCSK9i first. If cost is prohibitive, she seems willing to discuss CLEAR as a back up option. Will submit prior authorization for Repatha.   Megan E. Supple, PharmD, CPP, [place] [id number], [place], [place] [address] Phone: [phone]; Fax: [phone] 08/10/2016 1:34 PM

## 2016-08-11 ENCOUNTER — Telehealth: Payer: Self-pay | Admitting: Pharmacist

## 2016-08-11 MED ORDER — EVOLOCUMAB 140 MG/ML ~~LOC~~ SOAJ: 1.0000 "pen " | SUBCUTANEOUS | 11 refills | Status: DC

## 2016-08-11 NOTE — Telephone Encounter (Signed)
Repatha was approved by [facility] for only 6 weeks. Will send in rx but need pt to start therapy ASAP since approval time isn't enough for pt to receive medication and check for efficacy, will likely need samples.

## 2016-08-12 ENCOUNTER — Other Ambulatory Visit: Payer: Self-pay | Admitting: Pharmacist

## 2016-08-12 MED ORDER — EVOLOCUMAB 140 MG/ML ~~LOC~~ SOAJ: 1.0000 "pen " | SUBCUTANEOUS | 11 refills | Status: DC

## 2016-08-17 ENCOUNTER — Ambulatory Visit: Payer: PPO | Admitting: Internal Medicine

## 2016-08-19 ENCOUNTER — Telehealth: Payer: Self-pay | Admitting: Pharmacist

## 2016-08-22 ENCOUNTER — Encounter: Payer: Self-pay | Admitting: Adult Health

## 2016-08-22 NOTE — Telephone Encounter (Signed)
Please give spiriva or incruse or tudorza samples for next 3 months Please work with Marya Fossa and we can do needful to get patient assist  Dr. Brand Males, M.D., [facility].C.P Pulmonary and Critical Care Medicine Staff Physician [facility] Pager: [phone], If no answer or between  15:00h - 7:00h: call [phone]  08/22/2016 1:58 PM

## 2016-08-22 NOTE — Telephone Encounter (Signed)
Pt called distraught that Repatha would throw her into the donut hole. She states the pharmacy would not let her return the medication. Her copay was $320 and if she gets a second refill she will be in the donut hole.   After lengthy discussion of donut hole it was decided she would like to pursue patient assistance for Repatha. Will fill out form and leave at front desk for patient to complete. Patient states understanding and appreciation. She will notify our office when she decides to do her first injection as she has placed the injections in the fridge for safe keeping and would like to wait a few days to do the first injection.

## 2016-08-22 NOTE — Telephone Encounter (Signed)
MR What dosage of Spiriva would you like to give.

## 2016-08-22 NOTE — Telephone Encounter (Signed)
Please see patient e-mail.  I gave recommendations for patient assistance for the spiriva  Do you have any recommendations for alternative treatment as the Cinqair has put the patient in the donut hole and she cannot afford any of her medications.  Please advise [name]. Thanks.

## 2016-08-23 MED ORDER — TIOTROPIUM BROMIDE MONOHYDRATE 1.25 MCG/ACT IN AERS: 2.0000 | INHALATION_SPRAY | Freq: Every day | RESPIRATORY_TRACT | 0 refills | Status: DC

## 2016-08-23 NOTE — Telephone Encounter (Signed)
Giver her the lower dose of spiriva if you have it. If you do not have it, give her the higher dose. At this point we are trying to help her with sample  Dr. Brand Males, M.D., [facility].C.P Pulmonary and Critical Care Medicine Staff Physician [place] [facility] Pager: [phone], If no answer or between  15:00h - 7:00h: call [phone]  08/23/2016 4:48 PM

## 2016-08-24 ENCOUNTER — Encounter (HOSPITAL_COMMUNITY): Payer: PPO

## 2016-08-24 ENCOUNTER — Other Ambulatory Visit (HOSPITAL_COMMUNITY): Payer: Self-pay | Admitting: *Deleted

## 2016-08-25 ENCOUNTER — Encounter: Payer: Self-pay | Admitting: Cardiology

## 2016-08-25 ENCOUNTER — Ambulatory Visit (HOSPITAL_COMMUNITY)
Admission: RE | Admit: 2016-08-25 | Discharge: 2016-08-25 | Disposition: A | Discharge status: Home / Self Care | Payer: PPO | Source: Ambulatory Visit | Attending: Internal Medicine | Admitting: Internal Medicine

## 2016-08-25 DIAGNOSIS — J4550 Severe persistent asthma, uncomplicated: Secondary | ICD-10-CM | POA: Diagnosis not present

## 2016-08-25 MED ORDER — SODIUM CHLORIDE 0.9 % IV SOLN
225.0000 mg | Freq: Once | INTRAVENOUS | Status: AC
  Administered 2016-08-25: 12:00:00 230 mg via INTRAVENOUS
  Filled 2016-08-25: qty 23

## 2016-08-26 ENCOUNTER — Ambulatory Visit (INDEPENDENT_AMBULATORY_CARE_PROVIDER_SITE_OTHER): Payer: PPO | Admitting: Cardiology

## 2016-08-26 ENCOUNTER — Encounter: Payer: Self-pay | Admitting: Cardiology

## 2016-08-26 VITALS — BP 128/72 | HR 94 | Ht 62.0 in | Wt 168.0 lb

## 2016-08-26 DIAGNOSIS — I5032 Chronic diastolic (congestive) heart failure: Secondary | ICD-10-CM

## 2016-08-26 DIAGNOSIS — I2583 Coronary atherosclerosis due to lipid rich plaque: Secondary | ICD-10-CM | POA: Diagnosis not present

## 2016-08-26 DIAGNOSIS — E7801 Familial hypercholesterolemia: Secondary | ICD-10-CM | POA: Diagnosis not present

## 2016-08-26 DIAGNOSIS — I2510 Atherosclerotic heart disease of native coronary artery without angina pectoris: Secondary | ICD-10-CM

## 2016-08-26 DIAGNOSIS — E78019 Familial hypercholesterolemia, unspecified: Secondary | ICD-10-CM

## 2016-08-26 MED ORDER — NITROGLYCERIN 0.4 MG SL SUBL: 0.4000 mg | SUBLINGUAL_TABLET | SUBLINGUAL | 11 refills | Status: DC | PRN

## 2016-08-26 NOTE — Progress Notes (Signed)
Patient ID: Victoria [name], female   DOB: Apr 05, 1956, 61 y.o.   MRN: [id number]    Cardiology Office Note   Date:  08/26/2016   ID:  Victoria [name], DOB [birth year], MRN [id number]  PCP:  [name], DO  Cardiologist:  Dr. Ena Dawley    Chief complain: chest pain   History of Present Illness: Victoria [name] is a 61 y.o. female with a hx of severe persistent asthma, HL, obesity.  She had a normal stress test in 2013.  She was evaluated by Dr. Ena Dawley 3/15 for chest pain.  Cardiac CTA was recommended.  Her insurance denied paying for this test.  Stress echo was arranged but the patient declined to schedule it.    Admitted 10/2014 with bilateral lower lobe community acquired pneumonia.  She was readmitted within 24 hours for R lateral and inferior chest pain.  CTA was neg for PE.  Troponins were negative.  CXR showed worsening bilateral PNA.  She returned to the ED with chest pain on 5/30.  CEs were neg.    I saw her 11/19/14 with complaints of severe fatigue and worse DOE for the last 6 mos.  She had seen her pulmonologist several times and also had been to the emergency room several times. She complained of decreased exercise tolerance with elevated heart rate and blood pressure, palpitations with any type of activity. She c/o chest pain frequently. She noted relief with NTG in the past.  We set her up for LHC and an Echo.  Cardiac cath demonstrated mod non-obstructive CAD with ostial OM1 60%. This was not felt to be severe enough to proceed with PCI.  Vasospasm could not be excluded.  PRN Nitro was recommended.    08/26/2016 - 1 year follow up, she recently visited with our pharmacist because she couldn't tolerate several statins with severe muscle pain. She would qualify for PC SK 9 inhibitors however her co-pay cost prohibitive. She has severe persistent asthma and has recently seen Dr. Chase Caller started on IV medication that caused her $3600 a year. She otherwise denies chest pain shortness  of breath has no palpitation no dizziness or syncope no lower extremity edema orthopnea or proximal nocturnal dyspnea. She tries to eat healthy but despite that her cholesterol numbers are severely elevated. She has had a difficult year with severe persistent asthma requiring 8 courses of steroids, now followed by [name], improved.  Studies/Reports Reviewed Today:  Echo 11/28/14 - Moderateconcentric hypertrophy. EF 60% to 65%. Wallmotion was normal; Grade 1 diastolic dysfunction - Mitral valve: There was trivial regurgitation. - Left atrium: The atrium was normal in size. Impressions:- LVEF 60-65%, modearate LVH, diastolic dysfunction, indeterminateLV filling pressure, normal LA size.  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 There is ostial/proximal 25-30% narrowing but not the classic slitlike appearance.  60-70% stenosis in the ostium of the first obtuse marginal.  Otherwise normal coronary arteries.  Normal left ventricular function.  Normal pulmonary artery pressures RECOMMENDATIONS:  Continue to use sublingual nitroglycerin as needed for relief of discomfort.  Cannot exclude the possibility of coronary artery spasm.  There was not an indication for PCI on the obtuse marginal as it did not appear to be a hemodynamically significant stenosis.  Alternative explanations for the patient's chest discomfort including GI, musculoskeletal, or other etiology should be considered.  Risk factor modification including 81 mg of aspirin daily  Chest CTA  11/09/14 IMPRESSION: 1. No evidence of pulmonary embolism. 2. Findings again worrisome for right lower lobe pneumonia. A follow-up chest radiograph in 3-4 weeks after treatment is recommended to ensure resolution. 3. Punctate approximately 6 mm nodule within the right lower lobe is grossly unchanged since the 11/2011  examination and thus of benign etiology - evaluation for additional discrete pulmonary nodules is degraded secondary to concomitant superimposed acute infectious process within the right lower lobe. 4. Unchanged biapical pleural parenchymal thickening. 5. Unchanged moderate-sized [place] and hiatal hernias.  Myoview 12/14/11 IMPRESSION: No evidence of ischemia or infarct. Calculated ejection fraction 72%.  Past Medical History:  Diagnosis Date  . Allergic bronchopulmonary aspergillosis ([place]) 2008   sees Dr Edmund Hilda pulmonology  . Anemia    iron deficiency, resolved  . Anxiety   . Asthma   . CAD (coronary artery disease)    a. LHC 6/16:  oOM1 60, pRCA 25 >> med Rx  . CAP (community acquired pneumonia) 06/07/2016  . COPD (chronic obstructive pulmonary disease) ([place])   . Depression    mild  . Diverticulosis   . GERD (gastroesophageal reflux disease)   . H/O hiatal hernia   . History of echocardiogram    Echo 6/16:  Mod LVH, EF 60-65%, no RWMA, Gr 1 DD, trivial MR, normal LA size.  Marland Kitchen Hyperglycemia 11/20/2015  . Hyperlipidemia   . IBS (irritable bowel syndrome)   . Maxillary sinusitis   . Normal cardiac stress test 11/2011   No evidence of ischemia or infarct. Calculated ejection fraction 72%.  . Obesity   . OSA (obstructive sleep apnea) 02/2012   has stopped using  cpap  . Osteoarthritis   . Osteoporosis   . Pneumonia 11/2011   "before 2013 I hadn't had pneumonia since I was a child" (04/13/2012)  . Pulmonary nodules   . Schatzki's ring     Past Surgical History:  Procedure Laterality Date  . APPENDECTOMY  1989  . CARDIAC CATHETERIZATION N/A 11/25/2014   Procedure: Right/Left Heart Cath and Coronary Angiography;  Surgeon: Belva Crome, MD;  Location: [facility];  Service: Cardiovascular;  Laterality: N/A;  . CESAREAN SECTION  1985  . HERNIA REPAIR  04/13/2012   VHR laparoscopic  . VENTRAL HERNIA REPAIR  04/13/2012   Procedure: LAPAROSCOPIC VENTRAL  HERNIA;  Surgeon: Adin Hector, MD;  Location: [facility];  Service: General;  Laterality: N/A;  laparoscopic repair of incarcerated hernia     Current Outpatient Prescriptions  Medication Sig Dispense Refill  . ALPRAZolam (XANAX) 0.5 MG tablet Take 1 tablet (0.5 mg total) by mouth 2 (two) times daily as needed for anxiety (Palpatations, SOB). 30 tablet 1  . aspirin 81 MG tablet Take 81 mg by mouth daily.     . benzonatate (TESSALON) 100 MG capsule Take 1 capsule (100 mg total) by mouth 3 (three) times daily as needed for cough. 20 capsule 0  . Cholecalciferol (VITAMIN D) 2000 UNITS CAPS Take 2,000 Units by mouth daily.     . Coenzyme Q10 (CO Q 10 PO) Take 200 mg by mouth 2 (two) times daily.    . cyclobenzaprine (FLEXERIL) 10 MG tablet Take 1 tablet (10 mg total) by mouth 3 (three) times daily as needed for muscle spasms. 30 tablet 0  . Evolocumab (REPATHA SURECLICK) 981 MG/ML SOAJ Inject 1 pen into the skin every 14 (fourteen) days. 2 pen 11  . fexofenadine (ALLEGRA) 180 MG tablet Take 180 mg by mouth daily as needed for  allergies.     . fluticasone (FLONASE) 50 MCG/ACT nasal spray Place 2 sprays into both nostrils daily. 16 g 1  . Fluticasone-Salmeterol (AIRDUO RESPICLICK 570/17) 793-90 MCG/ACT AEPB Inhale 1 puff into the lungs 2 (two) times daily. 1 each 5  . furosemide (LASIX) 20 MG tablet Take 1 tablet (20 mg total) by mouth daily as needed. 30 tablet 1  . guaiFENesin (MUCINEX) 600 MG 12 hr tablet Take 2 tablets (1,200 mg total) by mouth 2 (two) times daily as needed. 30 tablet 1  . HYDROcodone-homatropine (HYDROMET) 5-1.5 MG/5ML syrup Take 5 mLs by mouth every 6 (six) hours as needed for cough. 240 mL 0  . ibuprofen (ADVIL,MOTRIN) 200 MG tablet Take 200 mg by mouth every 6 (six) hours as needed for mild pain or moderate pain.    Marland Kitchen ipratropium-albuterol (DUONEB) 0.5-2.5 (3) MG/3ML SOLN INHALE THE CONTENTS OF 1 VIAL VIA NEBULIZER UP TO 4 TO 6 TIMES A DAY. 360 mL 2  . Melatonin 1 MG SUBL Take  it nightly 30 tablet 0  . mometasone (ASMANEX 120 METERED DOSES) 220 MCG/INH inhaler Inhale 2 puffs into the lungs 2 (two) times daily. 1 Inhaler 12  . montelukast (SINGULAIR) 10 MG tablet TAKE 1 TABLET BY MOUTH once daily 90 tablet 1  . mupirocin nasal ointment (BACTROBAN) 2 % Place 1 application into the nose 2 (two) times daily. Use one-half of tube in each nostril twice daily for five (5) days. After application, press sides of nose together and gently massage.    . nitroGLYCERIN (NITROSTAT) 0.4 MG SL tablet Place 1 tablet (0.4 mg total) under the tongue every 5 (five) minutes as needed for chest pain. 25 tablet 11  . Omega-3 Fat Ac-Cholecalciferol (OMEGA-3 + VITAMIN D3 ULTRA STR) LIQD Take 1 Units by mouth daily. 1 teaspoon daily- 1600mg  omega 3    . Omeprazole-Sodium Bicarbonate (ZEGERID OTC PO) Take 1 tablet by mouth daily.    Marland Kitchen oseltamivir (TAMIFLU) 75 MG capsule Take 1 capsule (75 mg total) by mouth daily. 10 capsule 0  . OVER THE COUNTER MEDICATION Take 1 capsule by mouth daily. Tumeric    . Probiotic Product (PROBIOTIC-10 PO) Take 1 tablet by mouth daily.    . reslizumab (CINQAIR) 100 MG/10ML SOLN injection Inject into the vein every 30 (thirty) days.    Marland Kitchen tiotropium (SPIRIVA HANDIHALER) 18 MCG inhalation capsule INHALE THE CONTENTS OF ONE CAPSULE DAILY 90 capsule 3  . Tiotropium Bromide Monohydrate (SPIRIVA RESPIMAT) 1.25 MCG/ACT AERS Inhale 2 puffs into the lungs daily. 1 Inhaler 0  . venlafaxine XR (EFFEXOR XR) 37.5 MG 24 hr capsule Take daily along with the 75 mg tablet. ( 112.5mg  daily) 90 capsule 1  . venlafaxine XR (EFFEXOR XR) 75 MG 24 hr capsule Take this tablet along with your 37.5 mg tablet daily. ( 112.5mg  daily) 90 capsule 1   No current facility-administered medications for this visit.     Allergies:   Beclomethasone dipropionate; Budesonide-formoterol fumarate; Mometasone furo-formoterol fum; Sulfonamide derivatives; Pulmicort [budesonide]; and Statins    Social  History:  The patient  reports that she has never smoked. She has never used smokeless tobacco. She reports that she drinks about 2.4 oz of alcohol per week . She reports that she does not use drugs.   Family History:  The patient's family history includes Breast cancer in her mother and sister; Diabetes in her mother; Diverticulosis in her father; Heart attack in her maternal grandfather; Hypertension in her mother; Prostate cancer in  her father; Pulmonary embolism in her brother.    ROS:   Please see the history of present illness.   Review of Systems  Cardiovascular: Positive for dyspnea on exertion.  All other systems reviewed and are negative.    PHYSICAL EXAM: VS:  BP 128/72   Pulse 94   Ht 5\' 2"  (1.575 m)   Wt 168 lb (76.2 kg)   SpO2 98%   BMI 30.73 kg/m     Wt Readings from Last 3 Encounters:  08/26/16 168 lb (76.2 kg)  08/25/16 167 lb (75.8 kg)  07/27/16 163 lb (73.9 kg)     GEN: Well nourished, well developed, in no acute distress  HEENT: normal  Neck: no JVD,   no masses Cardiac:  Normal S1/S2, RRR, no edema   Respiratory:  clear to auscultation bilaterally, no wheezing, rhonchi or rales. GI: soft, nontender, nondistended, + BS MS: no deformity or atrophy  Skin: warm and dry  Neuro:  CNs II-XII intact, Strength and sensation are intact Psych: Normal affect   EKG:  EKG is ordered today.  It demonstrates:   NSR, HR 73, no change from prior tracing   Recent Labs: 11/20/2015: TSH 1.78 06/09/2016: ALT 54 06/10/2016: BUN 10; Creatinine, Ser 0.63; Hemoglobin 11.2; Magnesium 2.2; Platelets 277; Potassium 3.5; Sodium 138    Lipid Panel    Component Value Date/Time   CHOL 227 (H) 07/25/2016 0822   TRIG 81 07/25/2016 0822   HDL 51 07/25/2016 0822   CHOLHDL 4.5 (H) 07/25/2016 0822   CHOLHDL 5 11/20/2015 1231   VLDL 25.0 11/20/2015 1231   LDLCALC 160 (H) 07/25/2016 0822   LDLDIRECT 177.8 09/07/2011 0945      ASSESSMENT AND PLAN:  1. CAD:  No recent angina,  hasn't used NTG in a year, we will refill as hers is expired. She has moderate non-obstructive disease in multiple vessels, intolerant to 4 statins, LDL 162, PCSK 9 inhibitors cost prohibitive, the lipid clinic will try to enroll her in CLEAR study.   2. HLD (hyperlipidemia): as above.  3. Asthma, severe persistent, on 8 courses of steroids in the last year, now followed by [name] with some improvement. She is advised to start exercising as soon as possible from pulmonary standpoint.  4. Chronic diastolic CHF - now euvolemic, lasix PRN  Disposition:   FU with Dr. Ena Dawley 1 year. Ena Dawley, MD 08/26/2016 9:59 AM    [facility] [place], [place], [place]  [address] Phone: [phone]; Fax: [phone]

## 2016-08-26 NOTE — Patient Instructions (Signed)
Medication Instructions:   Your physician recommends that you continue on your current medications as directed. Please refer to the Current Medication list given to you today.    Follow-Up:  Your physician wants you to follow-up in: ONE YEAR WITH DR NELSON You will receive a reminder letter in the mail two months in advance. If you don't receive a letter, please call our office to schedule the follow-up appointment.        If you need a refill on your cardiac medications before your next appointment, please call your pharmacy.   

## 2016-09-23 ENCOUNTER — Encounter (HOSPITAL_COMMUNITY): Payer: PPO

## 2016-09-23 ENCOUNTER — Other Ambulatory Visit (HOSPITAL_COMMUNITY): Payer: Self-pay | Admitting: *Deleted

## 2016-09-26 ENCOUNTER — Encounter (HOSPITAL_COMMUNITY)
Admission: RE | Admit: 2016-09-26 | Discharge: 2016-09-26 | Disposition: A | Discharge status: Home / Self Care | Payer: PPO | Source: Ambulatory Visit | Attending: Internal Medicine | Admitting: Internal Medicine

## 2016-09-26 DIAGNOSIS — J4550 Severe persistent asthma, uncomplicated: Secondary | ICD-10-CM | POA: Diagnosis not present

## 2016-09-26 MED ORDER — SODIUM CHLORIDE 0.9 % IV SOLN
Freq: Once | INTRAVENOUS | Status: AC
  Administered 2016-09-26: 13:00:00 via INTRAVENOUS

## 2016-09-26 MED ORDER — SODIUM CHLORIDE 0.9 % IV SOLN
225.0000 mg | Freq: Once | INTRAVENOUS | Status: AC
  Administered 2016-09-26: 13:00:00 230 mg via INTRAVENOUS
  Filled 2016-09-26: qty 23

## 2016-10-05 ENCOUNTER — Telehealth: Payer: Self-pay | Admitting: Pharmacist

## 2016-10-05 NOTE — Telephone Encounter (Signed)
Spoke to patient about Repatha patient assistance program. She states she never finished filling out paperwork. Discussed CLEAR trial as mentioned in OV with Dr. Meda Coffee and she states she would prefer to pursue patient assistance. She will complete her portion and bring to office.

## 2016-10-06 ENCOUNTER — Encounter: Payer: Self-pay | Admitting: Internal Medicine

## 2016-10-06 ENCOUNTER — Ambulatory Visit (INDEPENDENT_AMBULATORY_CARE_PROVIDER_SITE_OTHER): Payer: PPO | Admitting: Internal Medicine

## 2016-10-06 VITALS — BP 112/58 | HR 73 | Ht 62.0 in | Wt 166.0 lb

## 2016-10-06 DIAGNOSIS — J45901 Unspecified asthma with (acute) exacerbation: Secondary | ICD-10-CM | POA: Diagnosis not present

## 2016-10-06 DIAGNOSIS — J4550 Severe persistent asthma, uncomplicated: Secondary | ICD-10-CM | POA: Diagnosis not present

## 2016-10-06 LAB — NITRIC OXIDE: NITRIC OXIDE: 74

## 2016-10-06 MED ORDER — DOXYCYCLINE HYCLATE 100 MG PO TABS: ORAL_TABLET | ORAL | 0 refills | Status: DC

## 2016-10-06 MED ORDER — PREDNISONE 10 MG PO TABS: ORAL_TABLET | ORAL | 0 refills | Status: DC

## 2016-10-06 NOTE — Addendum Note (Signed)
Addended by: Collier Salina on: 10/06/2016 12:21 PM   Modules accepted: Orders

## 2016-10-06 NOTE — Patient Instructions (Signed)
Severe persistent asthma with intensive monitoring Possible flare up  Plan - check sputum culture given MRSA infection dec 2017 - Take doxycycline 100mg  po twice daily x 7 days; take after meals and avoid sunlight - Please take prednisone 40 mg x1 day, then 30 mg x1 day, then 20 mg x1 day, then 10 mg x1 day, and then 5 mg x1 day and stop - continue asmanex, spiriva, singulair and cinqair as before  Followup 3 months or sooner if needed  - feno at followup

## 2016-10-06 NOTE — Progress Notes (Signed)
Subjective:     Patient ID: Victoria [name], female   DOB: 1956/02/25, 61 y.o.   MRN: [id number]  HPI    OV 10/06/2016  Chief Complaint  Patient presents with  . Follow-up    Pt states her asthma is good; had one flare up since last visit. SOB with exertion, cough with yellow to brown mucus prodcution, with frequent wheezing. Notice improvement with being on [employer]      61 year old female with severe asthma with ABPA and elevated IgE and eosinophilia. She is on influenza virus antibody treatment now for the last few months. She feels this is helping her significantly. Her symptoms course of better. She is on Asmanex, Spiriva, Singulair as well. She is not on daily prednisone anymore. In December 2017 should she did grow MRSA from sputum culture. Currently a few days ago she developed sore throat and having some sputum production. She is worried this might be MRSA. Also asthma itself is stable and well controlled according to history. She is wondering about getting a sputum culture and really wishes for it     Results for OBELIA, BONELLO (MRN [id number]) as of 10/06/2016 11:47  Ref. 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   reports that she has never smoked. She has never used smokeless tobacco.  Past Surgical  History:  Procedure Laterality Date  . APPENDECTOMY  1989  . CARDIAC CATHETERIZATION N/A 11/25/2014   Procedure: Right/Left Heart Cath and Coronary Angiography;  Surgeon: Belva Crome, MD;  Location: [facility];  Service: Cardiovascular;  Laterality: N/A;  . CESAREAN SECTION  1985  . HERNIA REPAIR  04/13/2012   VHR laparoscopic  . VENTRAL HERNIA REPAIR  04/13/2012   Procedure: LAPAROSCOPIC VENTRAL HERNIA;  Surgeon: Adin Hector, MD;  Location: [facility];  Service: General;  Laterality: N/A;  laparoscopic repair of incarcerated hernia    Allergies  Allergen Reactions  . Beclomethasone Dipropionate Hives and Other (See Comments)     weight gain  . Budesonide-Formoterol Fumarate Hives  . Mometasone Furo-Formoterol Fum Hives and Other (See Comments)    weight gain  . Sulfonamide Derivatives Hives and Rash  . Pulmicort [Budesonide]     Headaches   . Statins     Myalgias, RLS    Immunization History  Administered Date(s) Administered  . Influenza Split 04/20/2011  . Influenza Whole 06/06/2007, 04/15/2008, 04/02/2009, 03/29/2012  . Influenza, High Dose Seasonal PF 05/05/2015  . Influenza,inj,Quad PF,36+ Mos 05/09/2013, 03/03/2014, 05/03/2016  . Pneumococcal Conjugate-13 05/09/2013  . Pneumococcal Polysaccharide-23 05/04/2005  . Td 07/29/2009  . Tdap 03/11/2015    Family History  Problem Relation Age of Onset  . Breast cancer Mother   . Hypertension Mother   . Diabetes Mother   . Diverticulosis Father   . Prostate cancer Father   . Pulmonary embolism Brother     recurrent  . Heart attack Maternal Grandfather   . Breast cancer Sister   . Stroke Neg Hx      Current  Outpatient Prescriptions:  .  ALPRAZolam (XANAX) 0.5 MG tablet, Take 1 tablet (0.5 mg total) by mouth 2 (two) times daily as needed for anxiety (Palpatations, SOB)., Disp: 30 tablet, Rfl: 1 .  aspirin 81 MG tablet, Take 81 mg by mouth daily. , Disp: , Rfl:  .  benzonatate (TESSALON) 100 MG capsule,  Take 1 capsule (100 mg total) by mouth 3 (three) times daily as needed for cough., Disp: 20 capsule, Rfl: 0 .  chlorpheniramine-HYDROcodone (TUSSIONEX PENNKINETIC ER) 10-8 MG/5ML SUER, Take 5 mLs by mouth., Disp: , Rfl:  .  Cholecalciferol (VITAMIN D) 2000 UNITS CAPS, Take 2,000 Units by mouth daily. , Disp: , Rfl:  .  Coenzyme Q10 (CO Q 10 PO), Take 200 mg by mouth 2 (two) times daily., Disp: , Rfl:  .  cyclobenzaprine (FLEXERIL) 10 MG tablet, Take 1 tablet (10 mg total) by mouth 3 (three) times daily as needed for muscle spasms., Disp: 30 tablet, Rfl: 0 .  fexofenadine (ALLEGRA) 180 MG tablet, Take 180 mg by mouth daily as needed for allergies. , Disp: , Rfl:  .  fluticasone (FLONASE) 50 MCG/ACT nasal spray, Place 2 sprays into both nostrils daily., Disp: 16 g, Rfl: 1 .  Fluticasone-Salmeterol (AIRDUO RESPICLICK 941/74) 081-44 MCG/ACT AEPB, Inhale 1 puff into the lungs 2 (two) times daily., Disp: 1 each, Rfl: 5 .  furosemide (LASIX) 20 MG tablet, Take 1 tablet (20 mg total) by mouth daily as needed., Disp: 30 tablet, Rfl: 1 .  guaiFENesin (MUCINEX) 600 MG 12 hr tablet, Take 2 tablets (1,200 mg total) by mouth 2 (two) times daily as needed., Disp: 30 tablet, Rfl: 1 .  HYDROcodone-homatropine (HYDROMET) 5-1.5 MG/5ML syrup, Take 5 mLs by mouth every 6 (six) hours as needed for cough., Disp: 240 mL, Rfl: 0 .  ibuprofen (ADVIL,MOTRIN) 200 MG tablet, Take 200 mg by mouth every 6 (six) hours as needed for mild pain or moderate pain., Disp: , Rfl:  .  ipratropium-albuterol (DUONEB) 0.5-2.5 (3) MG/3ML SOLN, INHALE THE CONTENTS OF 1 VIAL VIA NEBULIZER UP TO 4 TO 6 TIMES A DAY., Disp: 360 mL, Rfl: 2 .  Melatonin 1 MG SUBL, Take it nightly, Disp: 30 tablet, Rfl: 0 .  mometasone (ASMANEX 120 METERED DOSES) 220 MCG/INH inhaler, Inhale 2 puffs into the lungs 2 (two) times daily., Disp: 1 Inhaler, Rfl: 12 .  montelukast (SINGULAIR) 10 MG tablet, TAKE 1 TABLET BY MOUTH once daily, Disp: 90 tablet, Rfl: 1 .   mupirocin nasal ointment (BACTROBAN) 2 %, Place 1 application into the nose 2 (two) times daily. Use one-half of tube in each nostril twice daily for five (5) days. After application, press sides of nose together and gently massage., Disp: , Rfl:  .  nitroGLYCERIN (NITROSTAT) 0.4 MG SL tablet, Place 1 tablet (0.4 mg total) under the tongue every 5 (five) minutes as needed for chest pain., Disp: 25 tablet, Rfl: 11 .  Omega-3 Fat Ac-Cholecalciferol (OMEGA-3 + VITAMIN D3 ULTRA STR) LIQD, Take 1 Units by mouth daily. 1 teaspoon daily- 1600mg  omega 3, Disp: , Rfl:  .  Omeprazole-Sodium Bicarbonate (ZEGERID OTC PO), Take 1 tablet by mouth daily., Disp: , Rfl:  .  OVER THE COUNTER MEDICATION, Take 1 capsule by mouth daily. Tumeric, Disp: , Rfl:  .  Probiotic Product (PROBIOTIC-10 PO), Take 1 tablet by mouth daily., Disp: , Rfl:  .  reslizumab (CINQAIR) 100 MG/10ML SOLN injection, Inject into the vein every 30 (thirty) days., Disp: , Rfl:  .  tiotropium (SPIRIVA HANDIHALER) 18 MCG inhalation capsule, INHALE THE CONTENTS OF ONE CAPSULE DAILY, Disp: 90 capsule, Rfl: 3 .  Tiotropium Bromide Monohydrate (SPIRIVA RESPIMAT) 1.25 MCG/ACT AERS, Inhale 2 puffs into the lungs daily., Disp: 1 Inhaler, Rfl: 0 .  venlafaxine XR (EFFEXOR XR) 37.5 MG 24 hr capsule, Take daily along with the 75 mg tablet. ( 112.5mg  daily), Disp: 90 capsule, Rfl: 1 .  venlafaxine XR (EFFEXOR XR) 75 MG 24 hr capsule, Take this tablet along with your 37.5 mg tablet daily. ( 112.5mg  daily), Disp: 90 capsule, Rfl: 1 .  Evolocumab (REPATHA SURECLICK) 161 MG/ML SOAJ, Inject 1 pen into the skin every 14 (fourteen) days. (Patient not taking: Reported on 10/06/2016), Disp: 2 pen, Rfl: 11 .  oseltamivir (TAMIFLU) 75 MG capsule, Take 1 capsule (75 mg total) by mouth daily. (Patient not taking: Reported on 10/06/2016), Disp: 10 capsule, Rfl: 0   Review of Systems     Objective:   Physical Exam  Constitutional: She is oriented to person, place, and  time. She appears well-developed and well-nourished. No distress.  HENT:  Head: Normocephalic and atraumatic.  Right Ear: External ear normal.  Left Ear: External ear normal.  Mouth/Throat: Oropharynx is clear and moist. No oropharyngeal exudate.  Eyes: Conjunctivae and EOM are normal. Pupils are equal, round, and reactive to light. Right eye exhibits no discharge. Left eye exhibits no discharge. No scleral icterus.  Neck: Normal range of motion. Neck supple. No JVD present. No tracheal deviation present. No thyromegaly present.  Cardiovascular: Normal rate, regular rhythm, normal heart sounds and intact distal pulses.  Exam reveals no gallop and no friction rub.   No murmur heard. Pulmonary/Chest: Effort normal and breath sounds normal. No respiratory distress. She has no wheezes. She has no rales. She exhibits no tenderness.  Abdominal: Soft. Bowel sounds are normal. She exhibits no distension and no mass. There is no tenderness. There is no rebound and no guarding.  Musculoskeletal: Normal range of motion. She exhibits no edema or tenderness.  Lymphadenopathy:    She has no cervical adenopathy.  Neurological: She is alert and oriented to person, place, and time. She has normal reflexes. No cranial nerve deficit. She exhibits normal muscle tone. Coordination normal.  Skin: Skin is warm and dry. No rash noted. She is not diaphoretic. No erythema. No pallor.  Psychiatric: She has a normal mood and affect. Her behavior is normal. Judgment and thought content normal.  Vitals reviewed.   Vitals:   10/06/16 1135  BP: (!) 112/58  Pulse: 73  SpO2: 96%  Weight: 166 lb (75.3 kg)  Height: 5\' 2"  (1.575 m)    Estimated body mass index is 30.36 kg/m as calculated from the following:   Height as of this encounter: 5\' 2"  (1.575 m).   Weight as of this encounter: 166 lb (75.3 kg).      Assessment:       ICD-9-CM ICD-10-CM   1. Asthma with acute exacerbation, unspecified asthma severity,  unspecified whether persistent 493.92 J45.901 Nitric oxide  2. Severe persistent asthma with intensive monitoring 493.90 J45.50        Plan:     Severe persistent asthma with intensive monitoring Possible flare up  Plan - check sputum culture given MRSA infection dec 2017 - Take doxycycline 100mg  po twice daily x 7 days; take after meals and avoid sunlight - Please take prednisone 40 mg x1 day, then 30 mg x1 day, then 20 mg x1 day, then 10 mg x1  day, and then 5 mg x1 day and stop - continue asmanex, spiriva, singulair and cinqair as before  Followup 3 months or sooner if needed  - feno at followup    Dr. Brand Males, M.D., [facility].C.P Pulmonary and Critical Care Medicine Staff Physician [place] [facility] Pager: [phone], If no answer or between  15:00h - 7:00h: call [phone]  10/06/2016 12:09 PM

## 2016-10-06 NOTE — Assessment & Plan Note (Signed)
Possible flare up  Plan - check sputum culture given MRSA infection dec 2017 - Take doxycycline 100mg  po twice daily x 7 days; take after meals and avoid sunlight - Please take prednisone 40 mg x1 day, then 30 mg x1 day, then 20 mg x1 day, then 10 mg x1 day, and then 5 mg x1 day and stop - continue asmanex, spiriva, singulair and cinqair as before  Followup 3 months or sooner if needed  - feno at followup

## 2016-10-07 ENCOUNTER — Telehealth: Payer: Self-pay | Admitting: Pharmacist

## 2016-10-07 ENCOUNTER — Telehealth: Payer: Self-pay | Admitting: Internal Medicine

## 2016-10-07 ENCOUNTER — Other Ambulatory Visit: Payer: PPO

## 2016-10-07 DIAGNOSIS — J45901 Unspecified asthma with (acute) exacerbation: Secondary | ICD-10-CM

## 2016-10-07 NOTE — Telephone Encounter (Signed)
Sputum culture ordered yesterday at office visit. We do not take specimen samples on this floor- they are to always be dropped off in lab in basement. Sample taken to basement.  Will close encounter.

## 2016-10-07 NOTE — Telephone Encounter (Signed)
-----   Message from Rockne Menghini, [place] sent at 10/07/2016  2:12 PM EDT ----- Georgina Peer  Ms, Kilcrease left a message for you on our phone, says she can't find her [place] paperwork, would you please send her another copy to fill out.  I hope this makes sense to you.  Erasmo Downer

## 2016-10-07 NOTE — Telephone Encounter (Signed)
Pt called because she lost her [employer] paperwork. Will print and leave for her to fill out at the front desk. She will come on Tuesday to fill out.

## 2016-10-10 LAB — RESPIRATORY CULTURE OR RESPIRATORY AND SPUTUM CULTURE: ORGANISM ID, BACTERIA: NORMAL

## 2016-10-11 ENCOUNTER — Telehealth: Payer: Self-pay | Admitting: Internal Medicine

## 2016-10-11 NOTE — Telephone Encounter (Signed)
Called and spoke to pt. Informed her of the results per MR. Pt verbalized understanding and denied any further questions or concerns at this time.  

## 2016-10-11 NOTE — Telephone Encounter (Signed)
Pls let Rayona Sardinha know that sputum culture was no growth. No mrsa  Thanks  Dr. Brand Males, M.D., [facility].C.P Pulmonary and Critical Care Medicine Staff Physician [place] [facility] Pager: [phone], If no answer or between  15:00h - 7:00h: call [phone]  10/11/2016 3:48 AM    Recent Results (from the past 720 hour(s))  Respiratory or Resp and Sputum Culture     Status: None   Collection Time: 10/07/16 12:59 PM  Result Value Ref Range Status   Gram Stain Abundant  Final   Gram Stain WBC present-both PMN and Mononuclear  Final   Gram Stain Few Squamous Epithelial Cells Present  Final   Gram Stain Moderate Gram Positive Cocci In Pairs  Final   Gram Stain   Final    Few Gram Negative Rods This specimen is acceptable for Sputum Culture   Organism ID, Bacteria Normal Oropharyngeal Flora  Final

## 2016-10-24 ENCOUNTER — Encounter (HOSPITAL_COMMUNITY): Payer: PPO

## 2016-10-26 ENCOUNTER — Encounter (HOSPITAL_COMMUNITY)
Admission: RE | Admit: 2016-10-26 | Discharge: 2016-10-26 | Disposition: A | Discharge status: Home / Self Care | Payer: PPO | Source: Ambulatory Visit | Attending: Internal Medicine | Admitting: Internal Medicine

## 2016-10-26 DIAGNOSIS — J4550 Severe persistent asthma, uncomplicated: Secondary | ICD-10-CM | POA: Diagnosis not present

## 2016-10-26 MED ORDER — RESLIZUMAB 100 MG/10ML IV SOLN
225.0000 mg | INTRAVENOUS | Status: DC
  Administered 2016-10-26: 10:00:00 230 mg via INTRAVENOUS
  Filled 2016-10-26: qty 23

## 2016-10-28 ENCOUNTER — Telehealth: Payer: Self-pay | Admitting: Internal Medicine

## 2016-10-28 NOTE — Telephone Encounter (Signed)
Received a PA for Nucala stating that the prior PA was about to expire. Upon looking at the patient's chart, I do not see where she is using Nucala.   Daneil Dan, I have searched her chart and see no mentioning of Nucala. Can you double check behind me? Perhaps Im missing something.

## 2016-10-28 NOTE — Telephone Encounter (Signed)
Pt is on Cinqair, I am not aware of pt being on Nucala or see any documentation in pt's chart.   MR please advise if pt is to start Nucala. Thanks.

## 2016-10-31 NOTE — Telephone Encounter (Signed)
LMTCB

## 2016-10-31 NOTE — Telephone Encounter (Signed)
Spoke with patient-she is doing well on Cinqair and is not on Nucala. We do not need to do PA for Nucala.

## 2016-10-31 NOTE — Telephone Encounter (Signed)
Patient returned call, CB [phone].

## 2016-10-31 NOTE — Telephone Encounter (Signed)
I do not remember having talk with patient about swithching from [place] to [place]. Can you please call patient and check if she wanted this switch? If not, then shred nucala paper work  Dr. Brand Males, M.D., [facility].C.P Pulmonary and Critical Care Medicine Staff Physician [place] [facility] Pager: [phone], If no answer or between  15:00h - 7:00h: call [phone]  10/31/2016 10:41 AM

## 2016-11-01 ENCOUNTER — Encounter: Payer: Self-pay | Admitting: Family Medicine

## 2016-11-03 IMAGING — DX DG CHEST 2V
2 series · 2 of 2 positions shown · non-contrast
Comparison: 05/30/2014 and earlier.

CLINICAL DATA: Three day history of shortness of breath and
right-sided chest pain. Current history of asthma.

EXAM:
CHEST  2 VIEW

[chest pa]
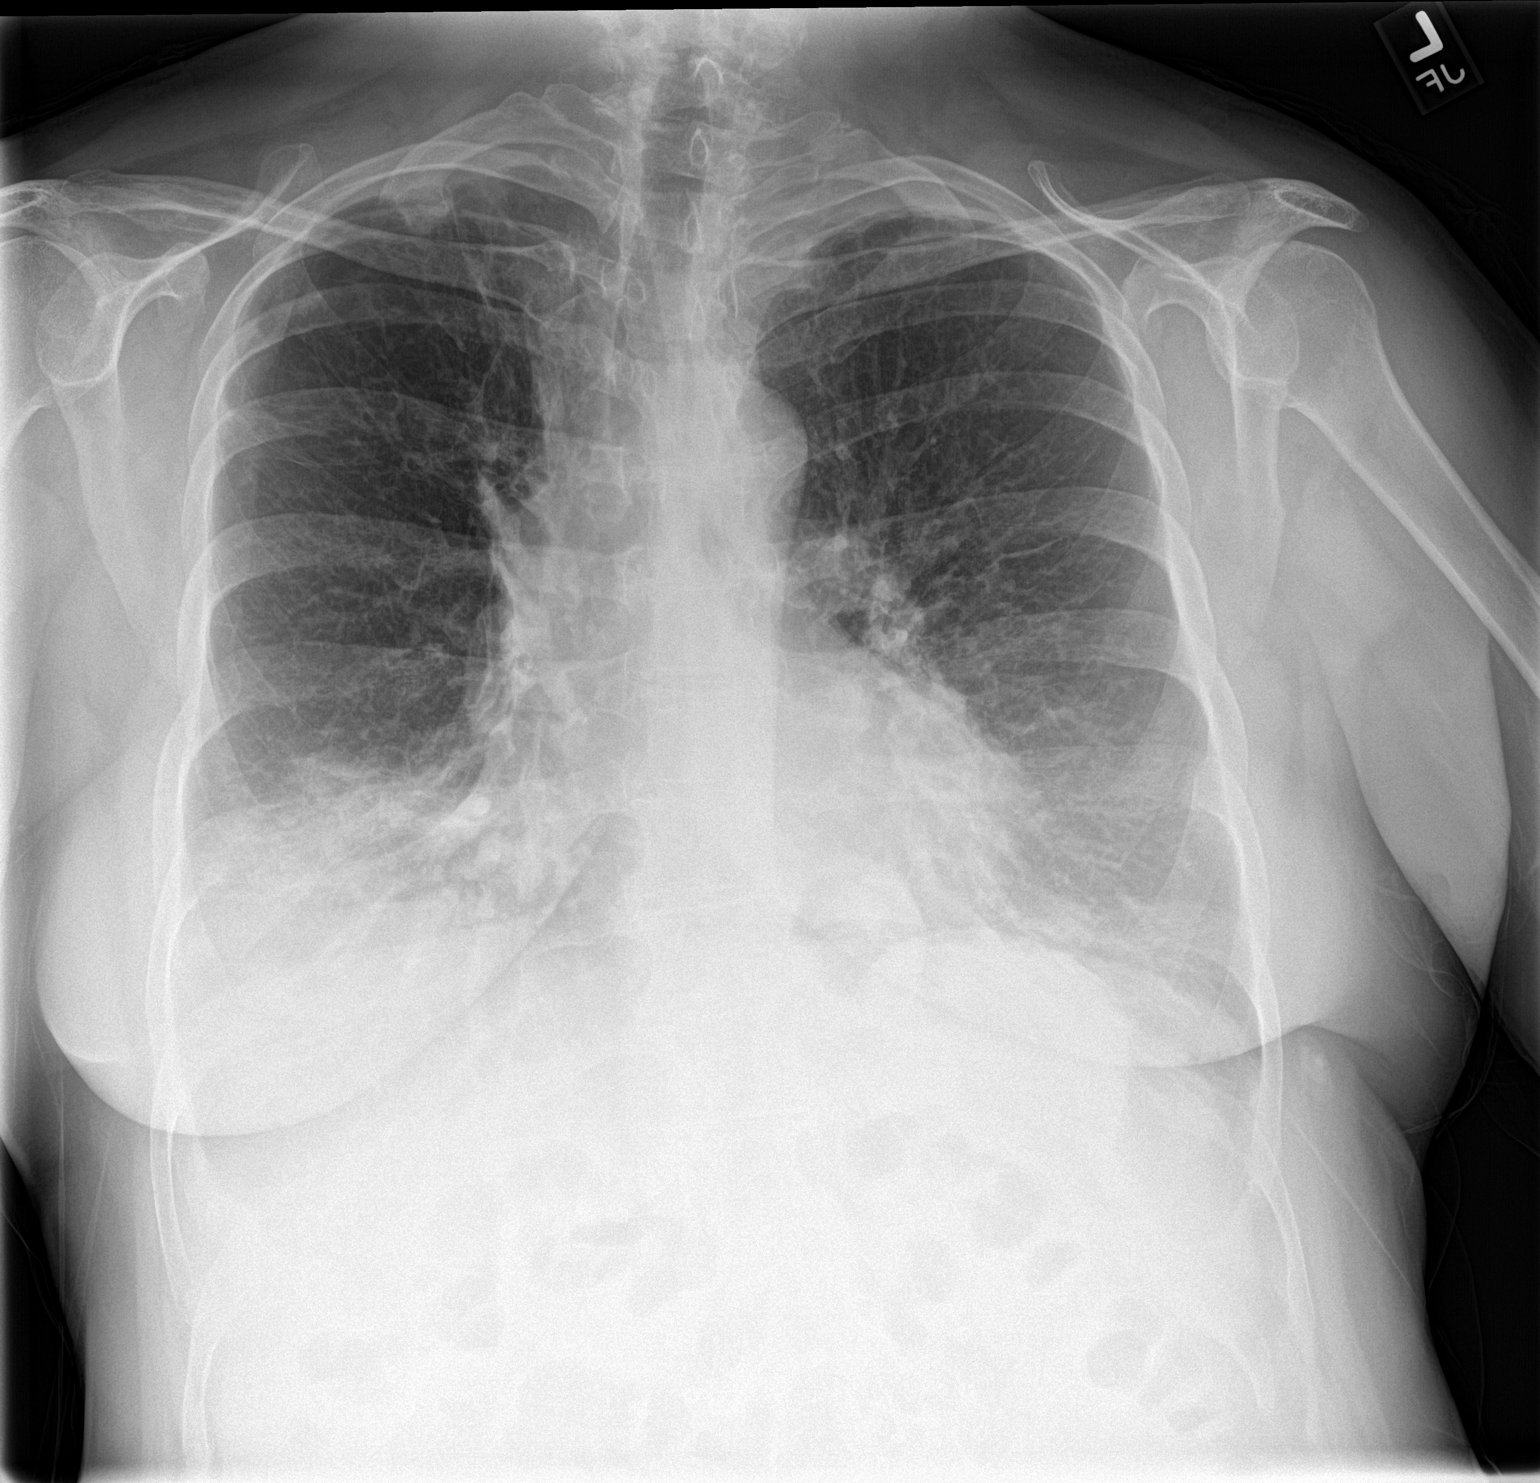

[chest lat]
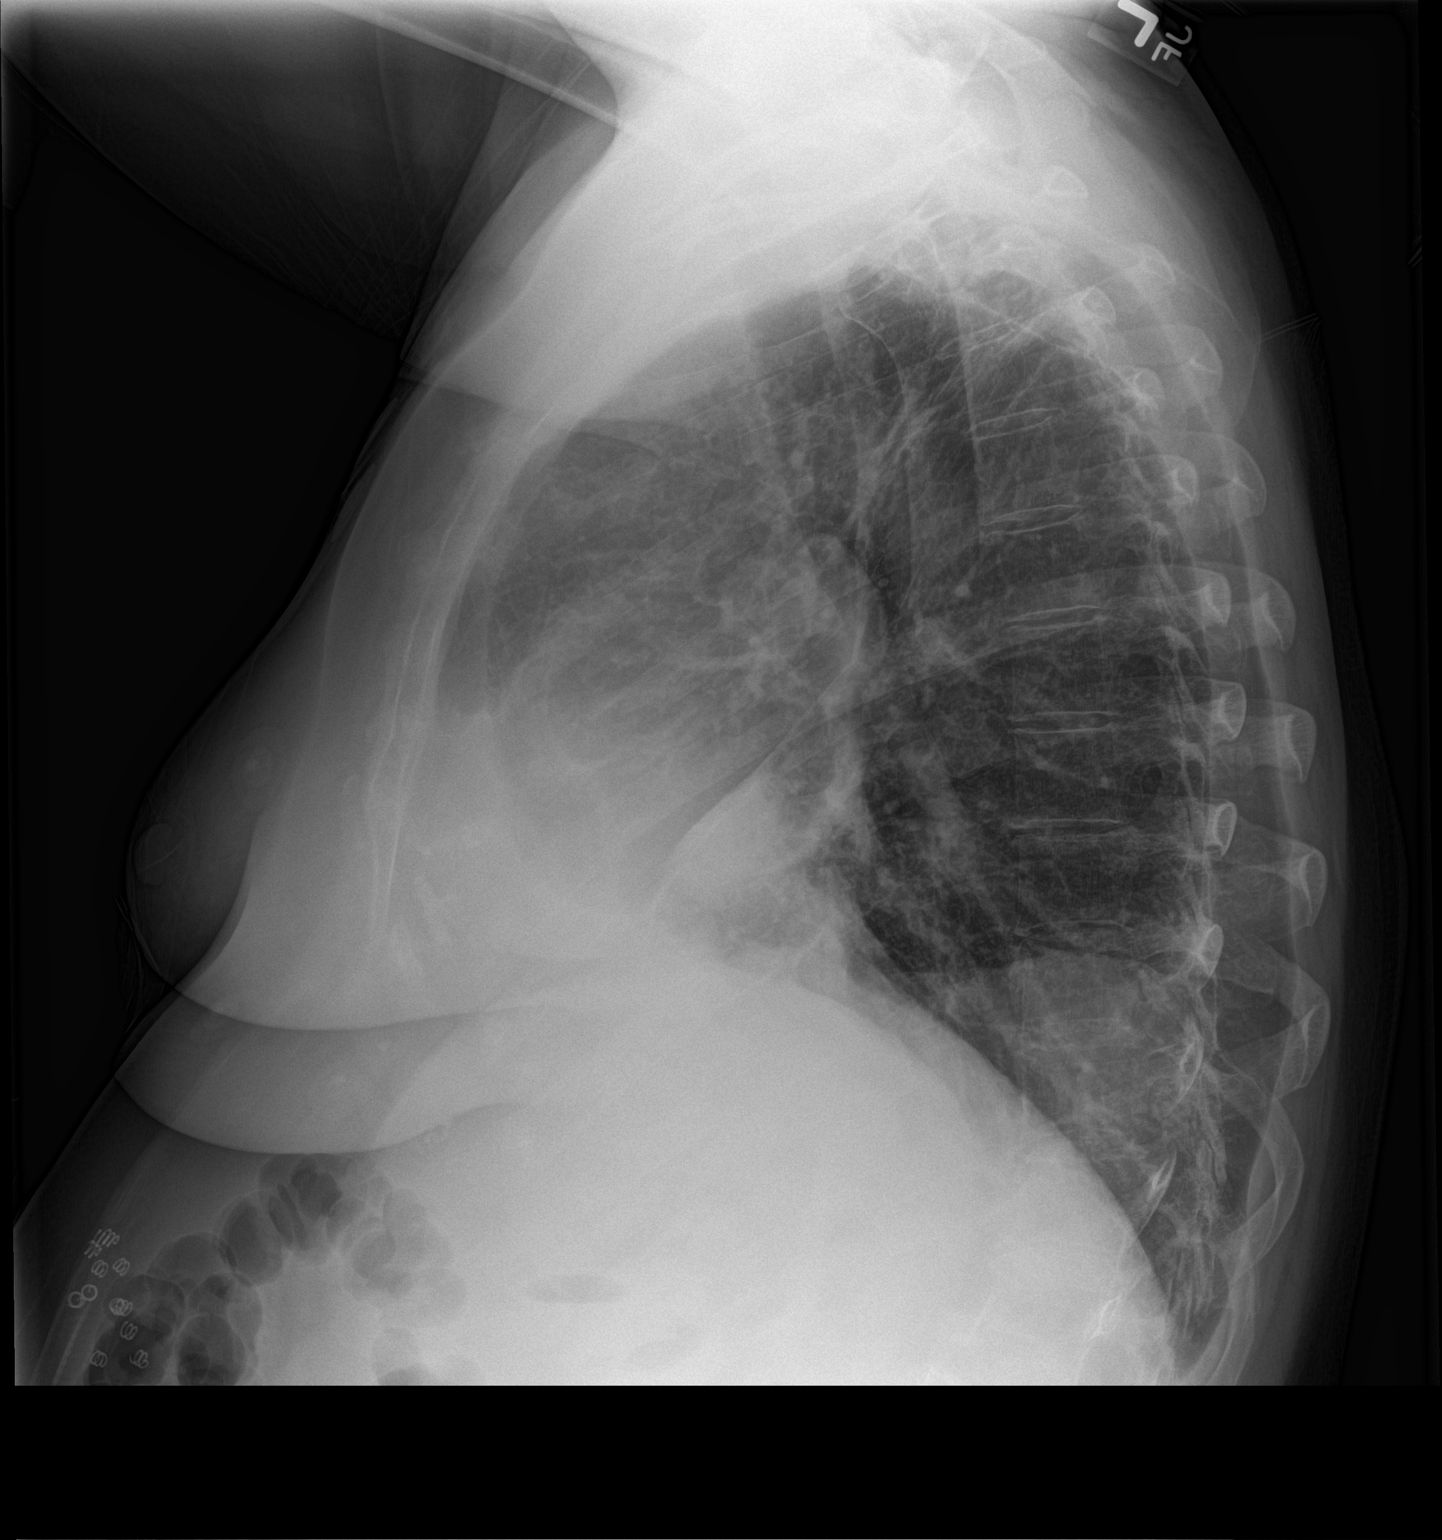

[2 of 2 positions shown; findings below may reference images not displayed]

FINDINGS: Cardiomediastinal silhouette unremarkable, unchanged. Dense airspace
consolidation in the right lower lobe with silhouetting of the right
hemidiaphragm. Patchy airspace opacities in the left lower lobe.
Stable linear scarring in the right upper lobe. No pleural
effusions. Degenerative changes involving the thoracic spine with
slight thoracic scoliosis convex right.
IMPRESSION: Bilateral lower lobe pneumonia, right greater than left.

## 2016-11-11 DIAGNOSIS — Z01419 Encounter for gynecological examination (general) (routine) without abnormal findings: Secondary | ICD-10-CM | POA: Diagnosis not present

## 2016-11-11 DIAGNOSIS — Z1231 Encounter for screening mammogram for malignant neoplasm of breast: Secondary | ICD-10-CM | POA: Diagnosis not present

## 2016-11-11 DIAGNOSIS — Z6830 Body mass index (BMI) 30.0-30.9, adult: Secondary | ICD-10-CM | POA: Diagnosis not present

## 2016-11-11 DIAGNOSIS — N958 Other specified menopausal and perimenopausal disorders: Secondary | ICD-10-CM | POA: Diagnosis not present

## 2016-11-11 DIAGNOSIS — Z13820 Encounter for screening for osteoporosis: Secondary | ICD-10-CM | POA: Diagnosis not present

## 2016-11-11 LAB — HM DEXA SCAN

## 2016-11-16 NOTE — Addendum Note (Signed)
Encounter addended by: Leonarda Salon, RN on: 11/16/2016  8:55 AM<BR>    Actions taken: Charge Capture section accepted

## 2016-11-23 ENCOUNTER — Encounter (HOSPITAL_COMMUNITY)
Admission: RE | Admit: 2016-11-23 | Discharge: 2016-11-23 | Disposition: A | Discharge status: Home / Self Care | Payer: PPO | Source: Ambulatory Visit | Attending: Internal Medicine | Admitting: Internal Medicine

## 2016-11-23 DIAGNOSIS — J4550 Severe persistent asthma, uncomplicated: Secondary | ICD-10-CM | POA: Insufficient documentation

## 2016-11-23 MED ORDER — SODIUM CHLORIDE 0.9 % IV SOLN
225.0000 mg | INTRAVENOUS | Status: DC
  Administered 2016-11-23: 230 mg via INTRAVENOUS
  Filled 2016-11-23: qty 23

## 2016-12-06 ENCOUNTER — Telehealth: Payer: Self-pay | Admitting: Pharmacist

## 2016-12-06 NOTE — Telephone Encounter (Signed)
Pt finally brought in paperwork for [employer] to see if Repatha can be covered for free. However due to the delay, her PA has already expired. Will submit new PA today which is required prior to submitting [employer] form.

## 2016-12-19 ENCOUNTER — Other Ambulatory Visit: Payer: Self-pay | Admitting: Internal Medicine

## 2016-12-19 ENCOUNTER — Other Ambulatory Visit: Payer: Self-pay | Admitting: Family Medicine

## 2016-12-23 ENCOUNTER — Telehealth: Payer: Self-pay | Admitting: Internal Medicine

## 2016-12-23 MED ORDER — PREDNISONE 10 MG PO TABS: ORAL_TABLET | ORAL | 0 refills | Status: DC

## 2016-12-23 MED ORDER — AZITHROMYCIN 250 MG PO TABS: ORAL_TABLET | ORAL | 0 refills | Status: DC

## 2016-12-23 NOTE — Telephone Encounter (Signed)
zpak Prednisone 10 mg take  4 each am x 2 days,   2 each am x 2 days,  1 each am x 2 days and stop  

## 2016-12-23 NOTE — Telephone Encounter (Signed)
Called and spoke with pt and she is aware of MW recs and that these meds have been sent to the pharmacy

## 2016-12-23 NOTE — Telephone Encounter (Signed)
Pt states breathing has been doing well with Cinqair until recently. Pt states she developed wheezing and non prod cough two weeks ago. Pt states she started allegra to help with sx with no improvement. Cough started producing yellow mucus x2d. Pt taken duoneb 2-3x daily and mucinex DM daily with mild improvement. Pt also taken Hycodan at night that was prescribed by TP 06/2016.  MW please advise, as MR is unavailable.   10/06/16  Possible flare up  Plan - check sputum culture given MRSA infection dec 2017 - Take doxycycline 100mg  po twice daily x 7 days; take after meals and avoid sunlight - Please take prednisone 40 mg x1 day, then 30 mg x1 day, then 20 mg x1 day, then 10 mg x1 day, and then 5 mg x1 day and stop - continue asmanex, spiriva, singulair and cinqair as before  Followup 3 months or sooner if needed             - feno at followup

## 2016-12-29 ENCOUNTER — Telehealth: Payer: Self-pay | Admitting: Internal Medicine

## 2016-12-29 ENCOUNTER — Ambulatory Visit (HOSPITAL_COMMUNITY)
Admission: RE | Admit: 2016-12-29 | Discharge: 2016-12-29 | Disposition: A | Discharge status: Home / Self Care | Payer: PPO | Source: Ambulatory Visit | Attending: Internal Medicine | Admitting: Internal Medicine

## 2016-12-29 NOTE — Telephone Encounter (Signed)
Spoke with Juliann Pulse at [facility]--pt is there now waiting to get Cinquair infusion but does not meet the qualifications since she just completed Prednisone and Zpak within the last 1-2 days. A patient cannot get the infusion if they have taken these types of medications within 2 weeks per the Qualifications on [place]. There is a question that asks if the patient has been sick within the last 2 weeks with an infection -- if answer "yes" then must get approval from MD. Pt is immunocompromised right now given that she has just completed Prednisone and ZPAK and the infusion may have an adverse affects. Please advise Dr Annamaria Boots if okay to proceed with infusion or reschedule--[name] if not available. Thanks.   Allergies as of 12/29/2016      Reactions   Beclomethasone Dipropionate Hives, Other (See Comments)    weight gain   Budesonide-formoterol Fumarate Hives   Mometasone Furo-formoterol Fum Hives, Other (See Comments)   weight gain   Sulfonamide Derivatives Hives, Rash   Pulmicort [budesonide]    Headaches   Statins    Myalgias, RLS      Medication List       Accurate as of 12/29/16 11:24 AM. Always use your most recent med list.          ALPRAZolam 0.5 MG tablet Commonly known as:  XANAX Take 1 tablet (0.5 mg total) by mouth 2 (two) times daily as needed for anxiety (Palpatations, SOB).   aspirin 81 MG tablet Take 81 mg by mouth daily.   azithromycin 250 MG tablet Commonly known as:  ZITHROMAX Take as directed per package   benzonatate 100 MG capsule Commonly known as:  TESSALON Take 1 capsule (100 mg total) by mouth 3 (three) times daily as needed for cough.   CO Q 10 PO Take 200 mg by mouth 2 (two) times daily.   cyclobenzaprine 10 MG tablet Commonly known as:  FLEXERIL Take 1 tablet (10 mg total) by mouth 3 (three) times daily as needed for muscle spasms.   doxycycline 100 MG tablet Commonly known as:  VIBRA-TABS Take 1 tablet, twice daily for 5 days. Avoid  sunlight and take after meals.   Evolocumab 140 MG/ML Soaj Commonly known as:  REPATHA SURECLICK Inject 1 pen into the skin every 14 (fourteen) days.   fexofenadine 180 MG tablet Commonly known as:  ALLEGRA Take 180 mg by mouth daily as needed for allergies.   fluticasone 50 MCG/ACT nasal spray Commonly known as:  FLONASE Place 2 sprays into both nostrils daily.   Fluticasone-Salmeterol 113-14 MCG/ACT Aepb Commonly known as:  AIRDUO RESPICLICK 702/63 Inhale 1 puff into the lungs 2 (two) times daily.   furosemide 20 MG tablet Commonly known as:  LASIX Take 1 tablet (20 mg total) by mouth daily as needed.   guaiFENesin 600 MG 12 hr tablet Commonly known as:  MUCINEX Take 2 tablets (1,200 mg total) by mouth 2 (two) times daily as needed.   HYDROcodone-homatropine 5-1.5 MG/5ML syrup Commonly known as:  HYDROMET Take 5 mLs by mouth every 6 (six) hours as needed for cough.   ibuprofen 200 MG tablet Commonly known as:  ADVIL,MOTRIN Take 200 mg by mouth every 6 (six) hours as needed for mild pain or moderate pain.   ipratropium-albuterol 0.5-2.5 (3) MG/3ML Soln Commonly known as:  DUONEB INHALE THE CONTENTS OF 1 VIAL VIA NEBULIZER UP TO 4 TO 6 TIMES A DAY.   Melatonin 1 MG Subl Take it nightly  mometasone 220 MCG/INH inhaler Commonly known as:  ASMANEX 120 METERED DOSES Inhale 2 puffs into the lungs 2 (two) times daily.   montelukast 10 MG tablet Commonly known as:  SINGULAIR TAKE 1 TABLET BY MOUTH ONCE DAILY   mupirocin nasal ointment 2 % Commonly known as:  BACTROBAN Place 1 application into the nose 2 (two) times daily. Use one-half of tube in each nostril twice daily for five (5) days. After application, press sides of nose together and gently massage.   nitroGLYCERIN 0.4 MG SL tablet Commonly known as:  NITROSTAT Place 1 tablet (0.4 mg total) under the tongue every 5 (five) minutes as needed for chest pain.   OMEGA-3 + VITAMIN D3 ULTRA STR Liqd Take 1 Units by  mouth daily. 1 teaspoon daily- 1600mg  omega 3   oseltamivir 75 MG capsule Commonly known as:  TAMIFLU Take 1 capsule (75 mg total) by mouth daily.   OVER THE COUNTER MEDICATION Take 1 capsule by mouth daily. Tumeric   predniSONE 10 MG tablet Commonly known as:  DELTASONE 40 mg x2 days, then 20 mg x 2 days, then 10 mg x 2 days then stop   PROBIOTIC-10 PO Take 1 tablet by mouth daily.   reslizumab 100 MG/10ML Soln injection Commonly known as:  CINQAIR Inject into the vein every 30 (thirty) days.   tiotropium 18 MCG inhalation capsule Commonly known as:  SPIRIVA HANDIHALER INHALE THE CONTENTS OF ONE CAPSULE DAILY   Tiotropium Bromide Monohydrate 1.25 MCG/ACT Aers Commonly known as:  SPIRIVA RESPIMAT Inhale 2 puffs into the lungs daily.   TUSSIONEX PENNKINETIC ER 10-8 MG/5ML Suer Generic drug:  chlorpheniramine-HYDROcodone Take 5 mLs by mouth.   venlafaxine XR 37.5 MG 24 hr capsule Commonly known as:  EFFEXOR-XR TAKE 1 CAPSULE BY MOUTH ONCE DAILY ALONG WITH 75 MG CAPSULE. (112.5 MG TOTAL DAILY)   venlafaxine XR 75 MG 24 hr capsule Commonly known as:  EFFEXOR-XR TAKE 1 CAPSULE BY MOUTH ONCE DAILY ALONG WITH 37.5 MG CAPSULE DAILY. (112.5 MG TOTAL DAILY)   Vitamin D 2000 units Caps Take 2,000 Units by mouth daily.   ZEGERID OTC PO Take 1 tablet by mouth daily.

## 2016-12-29 NOTE — Telephone Encounter (Signed)
Per Dr Annamaria Boots:  Please reschedule patients infusion to 2 weeks out from last day of abx/prednisone.   Spoke with [facility], made aware of appt reschedule. They are moving the appt out 2 weeks. Nothing further needed.  Will send to MR as [facility]

## 2016-12-29 NOTE — Progress Notes (Signed)
Per Dr Annamaria Boots reschedule pt for infusion in 2 weeks

## 2017-01-11 ENCOUNTER — Ambulatory Visit (INDEPENDENT_AMBULATORY_CARE_PROVIDER_SITE_OTHER): Payer: PPO | Admitting: Internal Medicine

## 2017-01-11 ENCOUNTER — Encounter: Payer: Self-pay | Admitting: Internal Medicine

## 2017-01-11 VITALS — BP 112/80 | HR 86 | Ht 62.0 in | Wt 162.0 lb

## 2017-01-11 DIAGNOSIS — J4550 Severe persistent asthma, uncomplicated: Secondary | ICD-10-CM | POA: Diagnosis not present

## 2017-01-11 DIAGNOSIS — J82 Pulmonary eosinophilia, not elsewhere classified: Secondary | ICD-10-CM

## 2017-01-11 DIAGNOSIS — J8283 Eosinophilic asthma: Secondary | ICD-10-CM

## 2017-01-11 LAB — NITRIC OXIDE: Nitric Oxide: 87

## 2017-01-11 NOTE — Progress Notes (Signed)
Subjective:     Patient ID: Victoria [name], female   DOB: 24-Oct-1955, 61 y.o.   MRN: [id number]  HPI   OV 10/06/2016  Chief Complaint  Patient presents with  . Follow-up    Pt states her asthma is good; had one flare up since last visit. SOB with exertion, cough with yellow to brown mucus prodcution, with frequent wheezing. Notice improvement with being on [employer]      61 year old female with severe asthma with ABPA and elevated IgE and eosinophilia. She is on IL5RAb antibody treatment now for the last few months. She feels this is helping her significantly. Her symptoms course of better. She is on Asmanex, Spiriva, Singulair as well. She is not on daily prednisone anymore. In December 2017 should she did grow MRSA from sputum culture. Currently a few days ago she developed sore throat and having some sputum production. She is worried this might be MRSA. Also asthma itself is stable and well controlled according to history. She is wondering about getting a sputum culture and really wishes for it    OV 01/11/2017  Chief Complaint  Patient presents with  . Follow-up    Pt states her breathing is doing well. Pt states the last couple days she has had more wheezing. Pt states she has noticed an improvement being on Cinqair.    61 year old female with severe asthma with ABPA and elevated IgE and eosinophilia. She is on IL5RAb antibody treatment.  She is on Asmanex, Spiriva, Singulair as well. She is not on daily prednisone anymore   01/11/2017 - Here for routine follow-up. She is on interleukin-5 receptor antibody IV infusion therapy. This was held a few weeks ago because of asthma exacerbation. She says approximately on Father's Day 2018 he started feeling unwell and then ended up with an exacerbation for which she needed antibiotics and prednisone. She is surprised by this decompensation despite being on this biologic immunotherapy for asthma. She is compliant with her Asmanex, Spiriva and  Singulair. She is not on prednisone. Currently she feels baseline. Patient infusion #6 or 7 is due for tomorrow [interleukin-5 receptor antibody). Overall she feels improved quality of life. Despite all this exhaled nitric oxide continues to be HIGH; unclear why   Results for KARTER, HAIRE (MRN [id number])   Ref. Range 05/05/2015 13:45 07/17/2015 12:31 11/24/2015 12:31 10/06/2016  01/11/2017   Nitric Oxide Unknown 80 80 126 74ppb 87ppb    Review of Systems     Objective:   Physical Exam  Constitutional: She is oriented to person, place, and time. She appears well-developed and well-nourished. No distress.  Looks well  HENT:  Head: Normocephalic and atraumatic.  Right Ear: External ear normal.  Left Ear: External ear normal.  Mouth/Throat: Oropharynx is clear and moist. No oropharyngeal exudate.  Eyes: Pupils are equal, round, and reactive to light. Conjunctivae and EOM are normal. Right eye exhibits no discharge. Left eye exhibits no discharge. No scleral icterus.  Neck: Normal range of motion. Neck supple. No JVD present. No tracheal deviation present. No thyromegaly present.  Cardiovascular: Normal rate, regular rhythm, normal heart sounds and intact distal pulses.  Exam reveals no gallop and no friction rub.   No murmur heard. Pulmonary/Chest: Effort normal and breath sounds normal. No respiratory distress. She has no wheezes. She has no rales. She exhibits no tenderness.  occ squeaks lung base  Abdominal: Soft. Bowel sounds are normal. She exhibits no distension and no mass. There is no tenderness. There is no rebound  and no guarding.  Musculoskeletal: Normal range of motion. She exhibits no edema or tenderness.  Lymphadenopathy:    She has no cervical adenopathy.  Neurological: She is alert and oriented to person, place, and time. She has normal reflexes. No cranial nerve deficit. She exhibits normal muscle tone. Coordination normal.  Skin: Skin is warm and dry. No rash noted. She is  not diaphoretic. No erythema. No pallor.  Psychiatric: She has a normal mood and affect. Her behavior is normal. Judgment and thought content normal.  Vitals reviewed.   Vitals:   01/11/17 1129  BP: 112/80  Pulse: 86  SpO2: 97%  Weight: 162 lb (73.5 kg)  Height: 5\' 2"  (1.575 m)   Estimated body mass index is 29.63 kg/m as calculated from the following:   Height as of this encounter: 5\' 2"  (1.575 m).   Weight as of this encounter: 162 lb (73.5 kg).      Assessment:       ICD-10-CM   1. Severe persistent asthma with intensive monitoring J45.50   2. 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[place]) Y33        Plan:      Glad you are better after recent flaare up Nitric oxide in lung continues to be high though in dec 2017 blood eosinohils reduced to 0%  - unclear why this discrepancy despite cinqair Rx In future we could consider dupulimab if approved by FDA for asthma For now continue cinqair (dose tomorrow), spiriva, singulair, and asmanex Glad overall you are doing better without daily pred but with monthly cinqair Flu shot in fall  Followup  - 63months or sooner if needed  - feno at followup  Dr. Brand Males, M.D., [facility].C.P Pulmonary and Critical Care Medicine Staff Physician [facility] Pager: [phone], If no answer or between  15:00h - 7:00h: call [phone]  01/11/2017 11:57 AM

## 2017-01-11 NOTE — Patient Instructions (Addendum)
ICD-10-CM   1. Severe persistent asthma with intensive monitoring J45.50   2. 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[place]) S91     Glad you are better after recent flaare up Nitric oxide in lung continues to be high though in dec 2017 blood eosinohils reduced to 0%  - unclear why this discrepancy despite cinqair Rx In future we could consider dupulimab if approved by FDA for asthma For now continue cinqair (dose tomorrow), spiriva, singulair, and asmanex Glad overall you are doing better without daily pred but with monthly cinqair Flu shot in fall  Followup  - 71months or sooner if needed  - feno at followup

## 2017-01-11 NOTE — Addendum Note (Signed)
Addended by: Collier Salina on: 01/11/2017 12:55 PM   Modules accepted: Orders

## 2017-01-12 ENCOUNTER — Ambulatory Visit (HOSPITAL_COMMUNITY)
Admission: RE | Admit: 2017-01-12 | Discharge: 2017-01-12 | Disposition: A | Discharge status: Home / Self Care | Payer: PPO | Source: Ambulatory Visit | Attending: Internal Medicine | Admitting: Internal Medicine

## 2017-01-12 DIAGNOSIS — J4550 Severe persistent asthma, uncomplicated: Secondary | ICD-10-CM | POA: Insufficient documentation

## 2017-01-12 MED ORDER — SODIUM CHLORIDE 0.9 % IV SOLN
225.0000 mg | INTRAVENOUS | Status: DC
  Administered 2017-01-12: 230 mg via INTRAVENOUS
  Filled 2017-01-12: qty 23

## 2017-01-16 ENCOUNTER — Telehealth: Payer: Self-pay | Admitting: Family Medicine

## 2017-01-16 ENCOUNTER — Ambulatory Visit (INDEPENDENT_AMBULATORY_CARE_PROVIDER_SITE_OTHER): Payer: PPO | Admitting: Family Medicine

## 2017-01-16 ENCOUNTER — Telehealth: Payer: Self-pay | Admitting: Internal Medicine

## 2017-01-16 VITALS — BP 123/80 | HR 98 | Ht 62.0 in | Wt 159.0 lb

## 2017-01-16 DIAGNOSIS — F329 Major depressive disorder, single episode, unspecified: Secondary | ICD-10-CM

## 2017-01-16 DIAGNOSIS — F419 Anxiety disorder, unspecified: Secondary | ICD-10-CM

## 2017-01-16 DIAGNOSIS — G4700 Insomnia, unspecified: Secondary | ICD-10-CM | POA: Insufficient documentation

## 2017-01-16 DIAGNOSIS — F19982 Other psychoactive substance use, unspecified with psychoactive substance-induced sleep disorder: Secondary | ICD-10-CM

## 2017-01-16 MED ORDER — MOMETASONE FUROATE 220 MCG/INH IN AEPB: 2.0000 | INHALATION_SPRAY | Freq: Two times a day (BID) | RESPIRATORY_TRACT | 12 refills | Status: DC

## 2017-01-16 MED ORDER — TIOTROPIUM BROMIDE MONOHYDRATE 18 MCG IN CAPS: ORAL_CAPSULE | RESPIRATORY_TRACT | 3 refills | Status: DC

## 2017-01-16 MED ORDER — VENLAFAXINE HCL ER 37.5 MG PO CP24: ORAL_CAPSULE | ORAL | 1 refills | Status: DC

## 2017-01-16 MED ORDER — PREDNISONE 10 MG PO TABS: ORAL_TABLET | ORAL | 0 refills | Status: DC

## 2017-01-16 MED ORDER — VENLAFAXINE HCL ER 75 MG PO CP24: 75.0000 mg | ORAL_CAPSULE | Freq: Every day | ORAL | 1 refills | Status: DC

## 2017-01-16 MED ORDER — VENLAFAXINE HCL ER 75 MG PO CP24: ORAL_CAPSULE | ORAL | 1 refills | Status: DC

## 2017-01-16 MED ORDER — ALPRAZOLAM 0.5 MG PO TABS: 0.5000 mg | ORAL_TABLET | Freq: Every evening | ORAL | 0 refills | Status: DC | PRN

## 2017-01-16 NOTE — Patient Instructions (Signed)
  What is Chronic Stress Syndrome, Symptoms & Ways to Deal With it   What is Chronic Stress Syndrome?  Chronic Stress Syndrome is something which can now be called as a medical condition due to the amount of stress an individual is going through these days. Chronic Stress Syndrome causes the body and mind to shutdown and the person has no control over himself or herself. Due to the demands of modern day life and the hardship throughout day and night takes its toll over a period of time and the body and brain starts demanding rest and a break. This leads to certain symptoms where your performance level starts to dip at work, you become irritable both at work and at home, you may stop enjoying activities you previously liked, you may become depressed, you may get angry for even small things. Chronic Stress Syndrome can significantly impact your quality life. Thus it is important understand the symptoms of Chronic Stress Syndrome and react accordingly in order to cope up with it.  It is important to note here that a balanced work-home equation should be drawn to cut down symptoms of Chronic Stress Syndrome. Minor stressors can be overcome by the body's inbuilt stress response but when there is unending stress for a long period of time then an external help is required to ease the stress.  Chronic Stress Syndrome can physically and psychologically drain you over a period of time. For such cases stress management is the best way to cope up with Chronic Stress Syndrome. If Chronic Stress Syndrome is not treated then it may result in many health hazards like anxiety, muscle pain, insomnia, and high blood pressure along with a compromised immune system leading to frequent infections and missed days from work.    What are the Symptoms of Chronic Stress Syndrome?   The symptoms of Chronic Stress Syndrome are variable and range from generalized symptoms to emotional symptoms along with behavioral and  cognitive symptoms. Some of these symptoms have been delineated below:  Generalized Symptoms of Chronic Stress Syndrome are: Anxiety Depression Social isolation Headache Abdominal pain Lack of sleep Back pain Difficulty in concentrating Hypertension Hemorrhoids Varicose veins Panic attacks/ Panic disorder Cardiovascular diseases.   Some of the Emotional Symptoms of Chronic Stress Syndrome are: To become easily agitated, moody and frustrated Feeling overwhelmed which makes you feel like you are losing control. Having difficulty relaxing and have a peaceful mind Having low self esteem Feeling lonely Feeling worthless Feeling depressed Avoiding social environment.   Some of the Physical Symptoms of Chronic Stress Syndrome are: Headaches Lethargy Alternating diarrhea and constipation Nausea Muscles aches and pains Insomnia Rapid heartbeat and chest pain Infections and frequent colds Decreased libido Nervousness and shaking Tinnitus Sweaty palms Dry mouth Clenched jaw.  Some of the Cognitive Symptoms of Chronic Stress Syndrome are: Constant worrying Racing thoughts Disorganization and forgetfulness Inability to focus Poor judgment Abundance of negativity.  Some of the Behavioral Symptoms of Chronic Stress Syndrome are: Changes in appetite with less desire to eat Avoiding responsibilities Indulgence in alcohol or recreational drug use Increased nail biting and being fidgety Ways to Deal With Chronic Stress Syndrome    Chronic Stress Syndrome is not something which cannot be addressed. A bit of effort from your side in the form of lifestyle modifications, a little bit of exercise, a balanced work life equation can do wonders and help you get rid of Chronic Stress Syndrome.  Get Proper Sleep: It has been proved that Chronic   Stress Syndrome causes loss of sleep where an individual may not even be able to sleep for days unending. This may result in the  individual feeling lethargic and unable to focus at work the following morning. This may lead to decreased performance at work. Thus, it is important to have a good sleep-wake cycle. For this, try and not drink any caffeinated beverage about four hours prior to going to sleep, as caffeine pumps up the adrenaline and causes you to stay awake resulting ultimately in Chronic Stress Syndrome.  Avoid Alcohol and Drugs: Another way to get rid of Chronic Stress Syndrome is lifestyle modifications. Stay away from alcohol and other recreational drugs. Take Short Frequent Breaks at Work: Try to take frequent breaks from work and do not work continuously. Try and manage your work in such a way that you even meet your deadline and come home on time for a happy dinner with family. A good time spent with family and kids does wonders in not only dealing with Chronic Stress Syndrome but also preventing it.  Become Physically Active: Another step towards getting rid of Chronic Stress Syndrome is physical activity. If you do not have time to spend at the gym then at least try and go for daily walks for about half an hour a day which not only keeps the stress away but also is good for your overall health. Physical activity leads to production of endorphins which will make you feel relaxed and feel good.  Healthy Diet Can Help You Deal With Chronic Stress Syndrome: Have a balanced and healthy diet is another step towards a stress free life and keeping Chronic Stress Syndrome at bay. If time is a constraint then you can try eating three small meals a day. Try and avoid fast foods and take foods which are healthy and rich in proteins, fiber, and carbohydrates to boost your energy system.  Music Can Soothe Your Mind: Light music is one of the best and most effective relaxation techniques that one can try to overcome stress. It has shown to calm down the mind and take you away from all the stressors that you may be having. These  days it is also being used as a therapy in some institutes for overcoming stress. It is important here to discuss the importance of a good social support system for patients with Chronic Stress Syndrome, as a good social support framework can do wonders in taking the stress away from the patient and overcoming Chronic Stress Syndrome.  Meditation Can Help You Deal With Chronic Stress Syndrome Effectively: Meditation and yoga has also shown to be quite effective in relaxing the mind and coping up with Chronic Stress Syndrome   In cases where these measures are not helpful, then it is time for you to consult with a skilled psychologist or a psychiatrist for potential therapies or medications to control the stress response.   The psychologist can help you with a variety of steps for coping up with Chronic Stress Syndrome. Relaxation techniques and behavioral therapy are some of the methods employed by psychologists. In some cases, medications can also be given to help relax the patient.  Since Chronic Stress Syndrome is both emotionally and physically draining for the patient and it also adversely affects the family life of the patient hence it is important for the patient to recognize the condition and taking steps to cope up with it. Escaping measures like alcohol and drug use are of no help as they only   aggravate the condition apart from their other health hazards. If this condition is ignored or left untreated it can lead to various medical conditions like anxiety and depression and various other medical conditions.  Last but not least, smile as often as you can as it is the best gift that you can give to someone. The best way to stay relaxed is to have a good smile, exercise daily, spend time with your family, meditation and if required consultation with a good psychologist so that you can live a stress free life and overcome the symptoms of Chronic Stress Syndrome. 

## 2017-01-16 NOTE — Telephone Encounter (Signed)
Patient was last seen 11 /2017 for chronic care- she was told to follow-up in a couple months.   I saw her shortly thereafter for a sick visit only.     Patient called and asked for refills on 7\2\18 and she was given #30 and told she needed to make a follow-up office visit for any further RF's.   This is been communicated multiple times to the patient.  - RF's denied until OV

## 2017-01-16 NOTE — Telephone Encounter (Signed)
Patient called asking for a refill of her venlafaxine (both the 37.5mg  and 75mg ). Also, her pulmonologist has started her on prednisone and she states it affects her sleep and normal day mood and was wondering if Dr. Jenetta Downer wouldn't mind refilling her alprazolam. All of this would need to be sent to [facility].

## 2017-01-16 NOTE — Telephone Encounter (Signed)
Called patient to notify that she needs an office visit because we have not seen her in the last 6 months.  Patient states that she is going out of town tomorrow and will be back the week of 8/13 and can come into the office than.  Please advise if patient can have refills before office visit.  Thanks.  MPulliam, CMA/RT(R)

## 2017-01-16 NOTE — Telephone Encounter (Signed)
rx  Sent to preferred pharmacy, pt aware.  Nothing further needed.

## 2017-01-16 NOTE — Progress Notes (Signed)
Impression and Recommendations:    1. Drug-induced insomnia ([place])- due to prednisone   2. Anxiety and depression      No problem-specific Assessment & Plan notes found for this encounter.   The patient was counseled, risk factors were discussed, anticipatory guidance given.   New Prescriptions   No medications on file     Discontinued Medications   No medications on file     Modified Medications   Modified Medication Previous Medication   ALPRAZOLAM (XANAX) 0.5 MG TABLET ALPRAZolam (XANAX) 0.5 MG tablet      Take 1 tablet (0.5 mg total) by mouth at bedtime as needed for anxiety (Palpatations, SOB).    Take 1 tablet (0.5 mg total) by mouth 2 (two) times daily as needed for anxiety (Palpatations, SOB).   VENLAFAXINE XR (EFFEXOR-XR) 37.5 MG 24 HR CAPSULE venlafaxine XR (EFFEXOR-XR) 37.5 MG 24 hr capsule      TAKE 1 CAPSULE BY MOUTH ONCE DAILY ALONG WITH 75 MG CAPSULE. (112.5 MG TOTAL DAILY)    TAKE 1 CAPSULE BY MOUTH ONCE DAILY ALONG WITH 75 MG CAPSULE. (112.5 MG TOTAL DAILY)   VENLAFAXINE XR (EFFEXOR-XR) 75 MG 24 HR CAPSULE venlafaxine XR (EFFEXOR-XR) 75 MG 24 hr capsule      TAKE 1 CAPSULE BY MOUTH ONCE DAILY ALONG WITH 37.5 MG CAPSULE DAILY. (112.5 MG TOTAL DAILY)    TAKE 1 CAPSULE BY MOUTH ONCE DAILY ALONG WITH 37.5 MG CAPSULE DAILY. (112.5 MG TOTAL DAILY)   VENLAFAXINE XR (EFFEXOR-XR) 75 MG 24 HR CAPSULE venlafaxine XR (EFFEXOR-XR) 75 MG 24 hr capsule      Take 1 capsule (75 mg total) by mouth daily.    Take 1 capsule by mouth daily.      No orders of the defined types were placed in this encounter.    Gross side effects, risk and benefits, and alternatives of medications and treatment plan in general discussed with patient.  Patient is aware that all medications have potential side effects and we are unable to predict every side effect or drug-drug interaction that may occur.   Patient will call with any questions prior to using medication if they have concerns.   Expresses verbal understanding and consents to current therapy and treatment regimen.  No barriers to understanding were identified.  Red flag symptoms and signs discussed in detail.  Patient expressed understanding regarding what to do in case of emergency\urgent symptoms  Please see AVS handed out to patient at the end of our visit for further patient instructions/ counseling done pertaining to today's office visit.   Return for couple mo f/up early Fall ofr flu shot.     Note: This document was prepared using Dragon voice recognition software and may include unintentional dictation errors.  [name] 3:59 PM --------------------------------------------------------------------------------------------------------------------------------------------------------------------------------------------------------------------------------------------    Subjective:    CC:  Chief Complaint  Patient presents with  . Medication Management    HPI: Victoria [name] is a 61 y.o. female who presents to [place] at [facility] today for issues as discussed below.  Pulm doc recently started her on prednisone again- now diff with sleep. Melatonin helps during normal nights- not with prednisone.   --> patient has used Xanax in the past to help her sleep.  Last filled 9\13\17 quantity 30, has not had any since.  Only takes 1/2 tab nightly if needed on prednisone.   -->  Mood- taking 75 mg plus 37.5mg  per day.  Going on vacation  to [facility] next week- stress from that .  ---> starting repatha today thru cards--->  Got a grant to be able to afford it.   No co=pay.      Wt Readings from Last 3 Encounters:  01/16/17 159 lb (72.1 kg)  01/12/17 162 lb (73.5 kg)  01/11/17 162 lb (73.5 kg)   BP Readings from Last 3 Encounters:  01/16/17 123/80  01/12/17 126/60  01/11/17 112/80   Pulse Readings from Last 3 Encounters:  01/16/17 98  01/12/17 84  01/11/17 86   BMI Readings  from Last 3 Encounters:  01/16/17 29.08 kg/m  01/12/17 29.63 kg/m  01/11/17 29.63 kg/m     Patient Care Team    Relationship Specialty Notifications Start End  [name], DO PCP - General Family Medicine  12/01/15   Haverstock, Jennefer Bravo, MD Referring Physician Dermatology  02/18/16   Brand Males, MD Consulting Physician Pulmonary Disease  02/18/16   Dorothy Spark, MD Consulting Physician Cardiology  02/18/16   Dian Queen, MD Consulting Physician Obstetrics and Gynecology  02/18/16   Melissa Montane, MD Consulting Physician Otolaryngology  02/18/16   Pyrtle, Lajuan Lines, MD Consulting Physician Gastroenterology  02/18/16      Patient Active Problem List   Diagnosis Date Noted  . Intolerance, drug:  statins\cholesterol medications 05/03/2016    Priority: High  . BMI 30.0-30.9,adult 02/18/2016    Priority: High  . Hyperglycemia 11/20/2015    Priority: High  . Anxiety state 08/18/2013    Priority: High  . ELEVATED BP READING WITHOUT DX HYPERTENSION 08/03/2009    Priority: High  . HLD (hyperlipidemia) 09/11/2007    Priority: High  . Depression 12/11/2006    Priority: High  . MRSA (methicillin resistant Staphylococcus aureus) colonization 01/12/2016    Priority: Medium  . Sleep disorder due to stress 01/12/2016    Priority: Medium  . Chronic diastolic CHF (congestive heart failure), NYHA class 2 ([place]) 06/26/2015    Priority: Medium  . Seasonal and perennial allergic rhinitis 10/22/2011    Priority: Medium  . OSA (obstructive sleep apnea) 11/13/2010    Priority: Medium  . Environmental allergies 11/13/2010    Priority: Medium  . DIVERTICULOSIS, COLON 06/11/2010    Priority: Medium  . Esophageal reflux 10/21/2008    Priority: Medium  . STRESS INCONTINENCE 09/11/2007    Priority: Medium  . Disorder of bone and cartilage 12/11/2006    Priority: Medium  . Muscle spasm 05/03/2016    Priority: Low  . Chronic bilateral thoracic back pain 05/03/2016     Priority: Low  . Fatigue 09/01/2011    Priority: Low  . A B P A-ALLERGIC BRONCHOPULMONARY ASPERGILLOSIS 12/14/2007    Priority: Low  . Osteoarthritis 12/11/2006    Priority: Low  . Anxiety and depression 01/16/2017  . Drug-induced insomnia ([place])- due to prednisone 01/16/2017  . CAP (community acquired pneumonia) 06/07/2016  . Dehydration, moderate 06/07/2016  . Multifocal pneumonia   . Wheezes 05/27/2016  . Acute recurrent sinusitis 05/27/2016  . Cough 05/27/2016  . Viral URI with cough 05/27/2016  . Severe persistent asthma with intensive monitoring 03/22/2016  . Coronary artery disease due to lipid rich plaque 06/26/2015  . 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[place]) 32/44/0102  . Cardiomegaly 11/21/2014  . Chest pain, atypical 06/01/2011  . UMBILICAL HERNIA 72/53/6644  . HEPATIC CYST 06/11/2010  . SCOLIOSIS 06/11/2010  . TRANSAMINASES, SERUM, ELEVATED 05/27/2010  . h/o PALPITATIONS 08/19/2008  . CHONDROMALACIA OF PATELLA 04/15/2008  . Cushing's syndrome ([place]) 12/11/2006  Past Medical history, Surgical history, Family history, Social history, Allergies and Medications have been entered into the medical record, reviewed and changed as needed.    Current Meds  Medication Sig  . ALPRAZolam (XANAX) 0.5 MG tablet Take 1 tablet (0.5 mg total) by mouth at bedtime as needed for anxiety (Palpatations, SOB).  Marland Kitchen aspirin 81 MG tablet Take 81 mg by mouth daily.   . benzonatate (TESSALON) 100 MG capsule Take 1 capsule (100 mg total) by mouth 3 (three) times daily as needed for cough.  . chlorpheniramine-HYDROcodone (TUSSIONEX PENNKINETIC ER) 10-8 MG/5ML SUER Take 5 mLs by mouth.  . Cholecalciferol (VITAMIN D) 2000 UNITS CAPS Take 2,000 Units by mouth daily.   . Coenzyme Q10 (CO Q 10 PO) Take 200 mg by mouth 2 (two) times daily.  . cyclobenzaprine (FLEXERIL) 10 MG tablet Take 1 tablet (10 mg total) by mouth 3 (three) times daily as needed for muscle spasms.  . Evolocumab (REPATHA SURECLICK) 643 MG/ML  SOAJ Inject 1 pen into the skin every 14 (fourteen) days.  . fexofenadine (ALLEGRA) 180 MG tablet Take 180 mg by mouth daily as needed for allergies.   . fluticasone (FLONASE) 50 MCG/ACT nasal spray Place 2 sprays into both nostrils daily.  . furosemide (LASIX) 20 MG tablet Take 1 tablet (20 mg total) by mouth daily as needed.  Marland Kitchen guaiFENesin (MUCINEX) 600 MG 12 hr tablet Take 2 tablets (1,200 mg total) by mouth 2 (two) times daily as needed.  Marland Kitchen HYDROcodone-homatropine (HYDROMET) 5-1.5 MG/5ML syrup Take 5 mLs by mouth every 6 (six) hours as needed for cough.  Marland Kitchen ibuprofen (ADVIL,MOTRIN) 200 MG tablet Take 200 mg by mouth every 6 (six) hours as needed for mild pain or moderate pain.  Marland Kitchen ipratropium-albuterol (DUONEB) 0.5-2.5 (3) MG/3ML SOLN INHALE THE CONTENTS OF 1 VIAL VIA NEBULIZER UP TO 4 TO 6 TIMES A DAY.  . Melatonin 1 MG SUBL Take it nightly  . mometasone (ASMANEX 120 METERED DOSES) 220 MCG/INH inhaler Inhale 2 puffs into the lungs 2 (two) times daily.  . montelukast (SINGULAIR) 10 MG tablet TAKE 1 TABLET BY MOUTH ONCE DAILY  . mupirocin nasal ointment (BACTROBAN) 2 % Place 1 application into the nose 2 (two) times daily. Use one-half of tube in each nostril twice daily for five (5) days. After application, press sides of nose together and gently massage.  . nitroGLYCERIN (NITROSTAT) 0.4 MG SL tablet Place 1 tablet (0.4 mg total) under the tongue every 5 (five) minutes as needed for chest pain.  . Omega-3 Fat Ac-Cholecalciferol (OMEGA-3 + VITAMIN D3 ULTRA STR) LIQD Take 1 Units by mouth daily. 1 teaspoon daily- 1600mg  omega 3  . Omeprazole-Sodium Bicarbonate (ZEGERID OTC PO) Take 1 tablet by mouth daily.  Marland Kitchen OVER THE COUNTER MEDICATION Take 1 capsule by mouth daily. Tumeric  . predniSONE (DELTASONE) 10 MG tablet 40mg X2 days, 30mg  X2 days, 20mg  X2 days, 10mg X2 days, then stop.  . Probiotic Product (PROBIOTIC-10 PO) Take 1 tablet by mouth daily.  . reslizumab (CINQAIR) 100 MG/10ML SOLN injection  Inject into the vein every 30 (thirty) days.  Marland Kitchen tiotropium (SPIRIVA HANDIHALER) 18 MCG inhalation capsule INHALE THE CONTENTS OF ONE CAPSULE DAILY  . venlafaxine XR (EFFEXOR-XR) 37.5 MG 24 hr capsule TAKE 1 CAPSULE BY MOUTH ONCE DAILY ALONG WITH 75 MG CAPSULE. (112.5 MG TOTAL DAILY)  . venlafaxine XR (EFFEXOR-XR) 75 MG 24 hr capsule TAKE 1 CAPSULE BY MOUTH ONCE DAILY ALONG WITH 37.5 MG CAPSULE DAILY. (112.5 MG TOTAL DAILY)  . [  DISCONTINUED] ALPRAZolam (XANAX) 0.5 MG tablet Take 1 tablet (0.5 mg total) by mouth 2 (two) times daily as needed for anxiety (Palpatations, SOB).  . [DISCONTINUED] venlafaxine XR (EFFEXOR-XR) 37.5 MG 24 hr capsule TAKE 1 CAPSULE BY MOUTH ONCE DAILY ALONG WITH 75 MG CAPSULE. (112.5 MG TOTAL DAILY)  . [DISCONTINUED] venlafaxine XR (EFFEXOR-XR) 75 MG 24 hr capsule TAKE 1 CAPSULE BY MOUTH ONCE DAILY ALONG WITH 37.5 MG CAPSULE DAILY. (112.5 MG TOTAL DAILY)    Allergies:  Allergies  Allergen Reactions  . Beclomethasone Dipropionate Hives and Other (See Comments)     weight gain  . Budesonide-Formoterol Fumarate Hives  . Mometasone Furo-Formoterol Fum Hives and Other (See Comments)    weight gain  . Sulfonamide Derivatives Hives and Rash  . Pulmicort [Budesonide]     Headaches   . Statins     Myalgias, RLS     Review of Systems: General:   Denies fever, chills, unexplained weight loss.  Optho/Auditory:   Denies visual changes, blurred vision/LOV Respiratory:   Denies wheeze, DOE more than baseline levels.  Cardiovascular:   Denies chest pain, palpitations, new onset peripheral edema  Gastrointestinal:   Denies nausea, vomiting, diarrhea, abd pain.  Genitourinary: Denies dysuria, freq/ urgency, flank pain or discharge from genitals.  Endocrine:     Denies hot or cold intolerance, polyuria, polydipsia. Musculoskeletal:   Denies unexplained myalgias, joint swelling, unexplained arthralgias, gait problems.  Skin:  Denies new onset rash, suspicious  lesions Neurological:     Denies dizziness, unexplained weakness, numbness  Psychiatric/Behavioral:   Denies mood changes, suicidal or homicidal ideations, hallucinations    Objective:   Blood pressure 123/80, pulse 98, height 5\' 2"  (1.575 m), weight 159 lb (72.1 kg). Body mass index is 29.08 kg/m. General:  Well Developed, well nourished, appropriate for stated age.  Neuro:  Alert and oriented,  extra-ocular muscles intact  HEENT:  Normocephalic, atraumatic, neck supple, no carotid bruits appreciated  Skin:  no gross rash, warm, pink. Cardiac:  RRR, S1 S2 Respiratory:  ECTA B/L and A/P, Not using accessory muscles, speaking in full sentences- unlabored. Vascular:  Ext warm, no cyanosis apprec.; cap RF less 2 sec. Psych:  No HI/SI, judgement and insight good, Euthymic mood. Full Affect.

## 2017-01-16 NOTE — Telephone Encounter (Signed)
Called patient appointment made for her for today.  MPulliam, CMA/RT(R)

## 2017-01-16 NOTE — Telephone Encounter (Signed)
Pt c/o wheezing and SOB x 5 days - worsening. Pt having some chest tightness and coughing. No mucus production.  Using Mucinex DM to help break up congestion.  Pt requesting Prednisone be called to [facility].  Pt refused appt with MD today. Will send to Dr Vaughan Browner as [name] is not available for messages.

## 2017-01-16 NOTE — Telephone Encounter (Signed)
Ok to call in prednisone starting at 40 mg. Reduce dose by 10 mg every 2 days.  Marshell Garfinkel MD [facility] 01/16/2017, 9:52 AM

## 2017-01-19 ENCOUNTER — Telehealth: Payer: Self-pay | Admitting: Pharmacist

## 2017-01-19 NOTE — Telephone Encounter (Signed)
Pt approved for Repatha coverage by [employer] on 12/20/16. LMOM for pt again to see if she has received shipment of Repatha so that we can set up f/u lab work.

## 2017-02-09 ENCOUNTER — Telehealth: Payer: Self-pay | Admitting: Internal Medicine

## 2017-02-09 ENCOUNTER — Ambulatory Visit (HOSPITAL_COMMUNITY)
Admission: RE | Admit: 2017-02-09 | Discharge: 2017-02-09 | Disposition: A | Discharge status: Home / Self Care | Payer: PPO | Source: Ambulatory Visit | Attending: Internal Medicine | Admitting: Internal Medicine

## 2017-02-09 DIAGNOSIS — J4550 Severe persistent asthma, uncomplicated: Secondary | ICD-10-CM | POA: Diagnosis not present

## 2017-02-09 MED ORDER — SODIUM CHLORIDE 0.9 % IV SOLN
225.0000 mg | INTRAVENOUS | Status: DC
  Administered 2017-02-09: 12:00:00 230 mg via INTRAVENOUS
  Filled 2017-02-09: qty 23

## 2017-02-09 NOTE — Telephone Encounter (Signed)
Spoke with Laverne. She stated that she had faxed over orders for patient's Cinqair infusion yesterday. Pt was there in short stay now. Spoke with Daneil Dan who stated that MR had already signed and faxed the orders back on the 20th. Had MR sign the order again and faxed it to [phone]. Received a confirmation fax.

## 2017-02-24 ENCOUNTER — Telehealth: Payer: Self-pay | Admitting: Pharmacist

## 2017-02-24 DIAGNOSIS — E785 Hyperlipidemia, unspecified: Secondary | ICD-10-CM

## 2017-02-24 NOTE — Addendum Note (Signed)
Addended by: Elayne Gruver E on: 02/24/2017 10:36 AM   Modules accepted: Orders

## 2017-02-24 NOTE — Telephone Encounter (Signed)
Pt returned call to clinic - states she received her Repatha and she is due for her 4th injection next week. She is having some joint pain but thinks it is related to her osteoarthritis and is taking turmeric to help. Scheduled lipids next week after pt has taken her 4th injection and before she sees her PCP for follow up.

## 2017-02-24 NOTE — Telephone Encounter (Signed)
LMOM for pt again. She was approved for Repatha therapy on 7/3 and have left multiple messages but have been unable to reach pt to see if she has received shipment or started Repatha injections.

## 2017-02-28 ENCOUNTER — Encounter: Payer: Self-pay | Admitting: Internal Medicine

## 2017-03-01 ENCOUNTER — Telehealth: Payer: Self-pay

## 2017-03-01 ENCOUNTER — Other Ambulatory Visit: Payer: PPO

## 2017-03-01 NOTE — Telephone Encounter (Signed)
Spoke with pt, I wanted to see if the denial was from the medication or the infusion. She read off a couple of things to me but I cant decifer which charge the insurance was referring to. I called her insurance company to see what is going on. They stated she sees that is was an outpatient hospital chemo injection therapy and does not require authorization. She will re-sumbit the claim and advise the team this was an outpatient service and doesn't need authorization.   I called pt to let her know to wait until they they re-submit the claim. I provided her with the reference number to follow up with this call. Nothing further is needed.   Reference number# [id number]  Ph. [phone]/ [phone] Fax. [phone]

## 2017-03-01 NOTE — Telephone Encounter (Signed)
Patient returned call, CB is [phone]

## 2017-03-01 NOTE — Telephone Encounter (Signed)
I got a notice today from my insurance, [employer], denying payment for my [place] IV on 01/12/2017 because there was not a prior authorization.    I also had another Cinqair IV on 02/09/2017, I'm afraid they won't be paying either.     Can you please check on this for me? I hope it can be sent to [employer] for authorization and that it will be retroactive.    Thank you,  Marya Fossa   Lmtcb x1 for pt to gather additional information.

## 2017-03-02 ENCOUNTER — Ambulatory Visit (INDEPENDENT_AMBULATORY_CARE_PROVIDER_SITE_OTHER): Payer: PPO | Admitting: Family Medicine

## 2017-03-02 ENCOUNTER — Ambulatory Visit (HOSPITAL_BASED_OUTPATIENT_CLINIC_OR_DEPARTMENT_OTHER)
Admission: RE | Admit: 2017-03-02 | Discharge: 2017-03-02 | Disposition: A | Discharge status: Home / Self Care | Payer: PPO | Source: Ambulatory Visit | Attending: Family Medicine | Admitting: Family Medicine

## 2017-03-02 ENCOUNTER — Encounter: Payer: Self-pay | Admitting: Family Medicine

## 2017-03-02 VITALS — BP 118/76 | HR 97 | Temp 98.5°F | Resp 18 | Ht 62.0 in | Wt 162.4 lb

## 2017-03-02 DIAGNOSIS — M5134 Other intervertebral disc degeneration, thoracic region: Secondary | ICD-10-CM | POA: Diagnosis not present

## 2017-03-02 DIAGNOSIS — G8929 Other chronic pain: Secondary | ICD-10-CM

## 2017-03-02 DIAGNOSIS — Z6830 Body mass index (BMI) 30.0-30.9, adult: Secondary | ICD-10-CM | POA: Diagnosis not present

## 2017-03-02 DIAGNOSIS — M546 Pain in thoracic spine: Secondary | ICD-10-CM

## 2017-03-02 DIAGNOSIS — E782 Mixed hyperlipidemia: Secondary | ICD-10-CM

## 2017-03-02 DIAGNOSIS — M4184 Other forms of scoliosis, thoracic region: Secondary | ICD-10-CM | POA: Insufficient documentation

## 2017-03-02 DIAGNOSIS — M4186 Other forms of scoliosis, lumbar region: Secondary | ICD-10-CM | POA: Insufficient documentation

## 2017-03-02 DIAGNOSIS — F329 Major depressive disorder, single episode, unspecified: Secondary | ICD-10-CM

## 2017-03-02 DIAGNOSIS — F419 Anxiety disorder, unspecified: Secondary | ICD-10-CM

## 2017-03-02 DIAGNOSIS — M545 Low back pain: Secondary | ICD-10-CM

## 2017-03-02 DIAGNOSIS — K219 Gastro-esophageal reflux disease without esophagitis: Secondary | ICD-10-CM | POA: Diagnosis not present

## 2017-03-02 DIAGNOSIS — M5136 Other intervertebral disc degeneration, lumbar region: Secondary | ICD-10-CM | POA: Diagnosis not present

## 2017-03-02 DIAGNOSIS — J82 Pulmonary eosinophilia, not elsewhere classified: Secondary | ICD-10-CM

## 2017-03-02 DIAGNOSIS — M47816 Spondylosis without myelopathy or radiculopathy, lumbar region: Secondary | ICD-10-CM | POA: Diagnosis not present

## 2017-03-02 DIAGNOSIS — R739 Hyperglycemia, unspecified: Secondary | ICD-10-CM

## 2017-03-02 DIAGNOSIS — F32A Depression, unspecified: Secondary | ICD-10-CM

## 2017-03-02 DIAGNOSIS — J8283 Eosinophilic asthma: Secondary | ICD-10-CM

## 2017-03-02 MED ORDER — MELOXICAM 7.5 MG PO TABS: 7.5000 mg | ORAL_TABLET | Freq: Every day | ORAL | 3 refills | Status: DC

## 2017-03-02 MED ORDER — VENLAFAXINE HCL ER 75 MG PO CP24: ORAL_CAPSULE | ORAL | 1 refills | Status: DC

## 2017-03-02 NOTE — Assessment & Plan Note (Signed)
Encouraged heart healthy diet, increase exercise, avoid trans fats, consider a krill oil cap daily 

## 2017-03-02 NOTE — Assessment & Plan Note (Signed)
hgba1c acceptable, minimize simple carbs. Increase exercise as tolerated.  

## 2017-03-02 NOTE — Progress Notes (Signed)
Subjective:  I acted as a Education administrator for Dr. Charlett Blake. [place], [place]  Patient ID: Victoria [name], female    DOB: [birth year], 61 y.o.   MRN: [id number]  No chief complaint on file.   HPI  Patient is in today for a follow up on numerous medical concerns. No recent febrile illness or hospitalizations. She had transferred care to [facility] but has chosen to transfer back. She has been doing better since starting [place]. Less wheezing and coughing. Notes ongoing thoracic and lumbar back pain now with increased right hip pain lately. No fall or trauma. Some massage therapy has helped temporarily but pain returns. No incontinence or symptoms to foot. Denies CP/palp/HA/congestion/fevers/GI or GU c/o. Taking meds as prescribed   Patient Care Team: Mosie Lukes, MD as PCP - General (Family Medicine) [place], Jennefer Bravo, MD as Referring Physician (Dermatology) Brand Males, MD as Consulting Physician (Pulmonary Disease) Dorothy Spark, MD as Consulting Physician (Cardiology) Dian Queen, MD as Consulting Physician (Obstetrics and Gynecology) Melissa Montane, MD as Consulting Physician (Otolaryngology) Pyrtle, Lajuan Lines, MD as Consulting Physician (Gastroenterology)   Past Medical History:  Diagnosis Date  . Allergic bronchopulmonary aspergillosis ([place]) 2008   sees Dr Edmund Hilda pulmonology  . Anemia    iron deficiency, resolved  . Anxiety   . Asthma   . CAD (coronary artery disease)    a. LHC 6/16:  oOM1 60, pRCA 25 >> med Rx  . CAP (community acquired pneumonia) 06/07/2016  . COPD (chronic obstructive pulmonary disease) ([place])   . Depression    mild  . Diverticulosis   . GERD (gastroesophageal reflux disease)   . H/O hiatal hernia   . History of echocardiogram    Echo 6/16:  Mod LVH, EF 60-65%, no RWMA, Gr 1 DD, trivial MR, normal LA size.  Marland Kitchen Hyperglycemia 11/20/2015  . Hyperlipidemia   . Hyperlipidemia, mixed 09/11/2007   Qualifier: Diagnosis of  By: Jerold Coombe    Did not tolerate Lipitor, zocor, Lovastatin, Pravastatin, Livalo, Crestor even low dose   . IBS (irritable bowel syndrome)   . Maxillary sinusitis   . Normal cardiac stress test 11/2011   No evidence of ischemia or infarct. Calculated ejection fraction 72%.  . Obesity   . OSA (obstructive sleep apnea) 02/2012   has stopped using  cpap  . Osteoarthritis   . Osteoporosis   . Pneumonia 11/2011   "before 2013 I hadn't had pneumonia since I was a child" (04/13/2012)  . Pulmonary nodules   . Schatzki's ring     Past Surgical History:  Procedure Laterality Date  . APPENDECTOMY  1989  . CARDIAC CATHETERIZATION N/A 11/25/2014   Procedure: Right/Left Heart Cath and Coronary Angiography;  Surgeon: Belva Crome, MD;  Location: [facility];  Service: Cardiovascular;  Laterality: N/A;  . CESAREAN SECTION  1985  . HERNIA REPAIR  04/13/2012   VHR laparoscopic  . VENTRAL HERNIA REPAIR  04/13/2012   Procedure: LAPAROSCOPIC VENTRAL HERNIA;  Surgeon: Adin Hector, MD;  Location: [facility];  Service: General;  Laterality: N/A;  laparoscopic repair of incarcerated hernia    Family History  Problem Relation Age of Onset  . Breast cancer Mother   . Hypertension Mother   . Diabetes Mother   . Diverticulosis Father   . Prostate cancer Father   . Pulmonary embolism Brother        recurrent  . Heart attack Maternal Grandfather   . Breast cancer Sister   .  Stroke Neg Hx     Social History   Social History  . Marital status: Married    Spouse name: N/A  . Number of children: 1  . Years of education: N/A   Occupational History  .  Unemployed   Social History Main Topics  . Smoking status: Never Smoker  . Smokeless tobacco: Never Used  . Alcohol use 2.4 oz/week    2 Glasses of wine, 2 Cans of beer per week     Comment: "  . Drug use: No  . Sexual activity: Yes     Comment: gluten free, lives with husband and son with CP quadriplegia   Other Topics Concern  . Not on file    Social History Narrative   Cares for a 41yo son with cerebral palsy.     Outpatient Medications Prior to Visit  Medication Sig Dispense Refill  . ALPRAZolam (XANAX) 0.5 MG tablet Take 1 tablet (0.5 mg total) by mouth at bedtime as needed for anxiety (Palpatations, SOB). 30 tablet 0  . aspirin 81 MG tablet Take 81 mg by mouth daily.     . benzonatate (TESSALON) 100 MG capsule Take 1 capsule (100 mg total) by mouth 3 (three) times daily as needed for cough. 20 capsule 0  . chlorpheniramine-HYDROcodone (TUSSIONEX PENNKINETIC ER) 10-8 MG/5ML SUER Take 5 mLs by mouth.    . Cholecalciferol (VITAMIN D) 2000 UNITS CAPS Take 2,000 Units by mouth daily.     . cyclobenzaprine (FLEXERIL) 10 MG tablet Take 1 tablet (10 mg total) by mouth 3 (three) times daily as needed for muscle spasms. 30 tablet 0  . Evolocumab (REPATHA SURECLICK) 841 MG/ML SOAJ Inject 1 pen into the skin every 14 (fourteen) days. 2 pen 11  . fexofenadine (ALLEGRA) 180 MG tablet Take 180 mg by mouth daily as needed for allergies.     . fluticasone (FLONASE) 50 MCG/ACT nasal spray Place 2 sprays into both nostrils daily. 16 g 1  . furosemide (LASIX) 20 MG tablet Take 1 tablet (20 mg total) by mouth daily as needed. 30 tablet 1  . guaiFENesin (MUCINEX) 600 MG 12 hr tablet Take 2 tablets (1,200 mg total) by mouth 2 (two) times daily as needed. 30 tablet 1  . HYDROcodone-homatropine (HYDROMET) 5-1.5 MG/5ML syrup Take 5 mLs by mouth every 6 (six) hours as needed for cough. 240 mL 0  . ibuprofen (ADVIL,MOTRIN) 200 MG tablet Take 200 mg by mouth every 6 (six) hours as needed for mild pain or moderate pain.    Marland Kitchen ipratropium-albuterol (DUONEB) 0.5-2.5 (3) MG/3ML SOLN INHALE THE CONTENTS OF 1 VIAL VIA NEBULIZER UP TO 4 TO 6 TIMES A DAY. 360 mL 2  . Melatonin 1 MG SUBL Take it nightly 30 tablet 0  . mometasone (ASMANEX 120 METERED DOSES) 220 MCG/INH inhaler Inhale 2 puffs into the lungs 2 (two) times daily. 1 Inhaler 12  . montelukast  (SINGULAIR) 10 MG tablet TAKE 1 TABLET BY MOUTH ONCE DAILY 90 tablet 5  . nitroGLYCERIN (NITROSTAT) 0.4 MG SL tablet Place 1 tablet (0.4 mg total) under the tongue every 5 (five) minutes as needed for chest pain. 25 tablet 11  . Omeprazole-Sodium Bicarbonate (ZEGERID OTC PO) Take 1 tablet by mouth daily.    Marland Kitchen OVER THE COUNTER MEDICATION Take 1 capsule by mouth daily. Tumeric    . reslizumab (CINQAIR) 100 MG/10ML SOLN injection Inject into the vein every 30 (thirty) days.    Marland Kitchen tiotropium (SPIRIVA HANDIHALER) 18 MCG inhalation capsule  INHALE THE CONTENTS OF ONE CAPSULE DAILY 90 capsule 3  . venlafaxine XR (EFFEXOR-XR) 37.5 MG 24 hr capsule TAKE 1 CAPSULE BY MOUTH ONCE DAILY ALONG WITH 75 MG CAPSULE. (112.5 MG TOTAL DAILY) 90 capsule 1  . venlafaxine XR (EFFEXOR-XR) 75 MG 24 hr capsule TAKE 1 CAPSULE BY MOUTH ONCE DAILY ALONG WITH 37.5 MG CAPSULE DAILY. (112.5 MG TOTAL DAILY) 90 capsule 1  . Coenzyme Q10 (CO Q 10 PO) Take 200 mg by mouth 2 (two) times daily.    . mupirocin nasal ointment (BACTROBAN) 2 % Place 1 application into the nose 2 (two) times daily. Use one-half of tube in each nostril twice daily for five (5) days. After application, press sides of nose together and gently massage.    . Omega-3 Fat Ac-Cholecalciferol (OMEGA-3 + VITAMIN D3 ULTRA STR) LIQD Take 1 Units by mouth daily. 1 teaspoon daily- 1600mg  omega 3    . predniSONE (DELTASONE) 10 MG tablet 40mg X2 days, 30mg  X2 days, 20mg  X2 days, 10mg X2 days, then stop. 20 tablet 0  . Probiotic Product (PROBIOTIC-10 PO) Take 1 tablet by mouth daily.    Marland Kitchen venlafaxine XR (EFFEXOR-XR) 75 MG 24 hr capsule Take 1 capsule (75 mg total) by mouth daily. 90 capsule 1   No facility-administered medications prior to visit.     Allergies  Allergen Reactions  . Beclomethasone Dipropionate Hives and Other (See Comments)     weight gain  . Budesonide-Formoterol Fumarate Hives  . Mometasone Furo-Formoterol Fum Hives and Other (See Comments)    weight  gain  . Sulfonamide Derivatives Hives and Rash  . Pulmicort [Budesonide]     Headaches   . Statins     Myalgias, RLS    Review of Systems  Constitutional: Negative for fever and malaise/fatigue.  HENT: Negative for congestion.   Eyes: Negative for blurred vision.  Respiratory: Positive for shortness of breath and wheezing.   Cardiovascular: Negative for chest pain, palpitations and leg swelling.  Gastrointestinal: Negative for abdominal pain, blood in stool and nausea.  Genitourinary: Negative for dysuria and frequency.  Musculoskeletal: Positive for back pain and joint pain. Negative for falls.  Skin: Negative for rash.  Neurological: Negative for dizziness, loss of consciousness and headaches.  Endo/Heme/Allergies: Negative for environmental allergies.  Psychiatric/Behavioral: Negative for depression. The patient is not nervous/anxious.        Objective:    Physical Exam  Constitutional: She is oriented to person, place, and time. She appears well-developed and well-nourished. No distress.  HENT:  Head: Normocephalic and atraumatic.  Nose: Nose normal.  Eyes: Right eye exhibits no discharge. Left eye exhibits no discharge.  Neck: Normal range of motion. Neck supple.  Cardiovascular: Normal rate and regular rhythm.   No murmur heard. Pulmonary/Chest: Effort normal and breath sounds normal.  Abdominal: Soft. Bowel sounds are normal. There is no tenderness.  Musculoskeletal: She exhibits no edema.  Neurological: She is alert and oriented to person, place, and time.  Skin: Skin is warm and dry.  Psychiatric: She has a normal mood and affect.  Nursing note and vitals reviewed.   BP 118/76 (BP Location: Left Arm, Patient Position: Sitting, Cuff Size: Normal)   Pulse 97   Temp 98.5 F (36.9 C) (Oral)   Resp 18   Ht 5\' 2"  (1.575 m)   Wt 162 lb 6.4 oz (73.7 kg)   SpO2 98%   BMI 29.70 kg/m  Wt Readings from Last 3 Encounters:  03/02/17 162 lb 6.4 oz (73.7 kg)  02/09/17 160 lb (72.6 kg)  01/16/17 159 lb (72.1 kg)   BP Readings from Last 3 Encounters:  03/02/17 118/76  02/09/17 113/70  01/16/17 123/80     Immunization History  Administered Date(s) Administered  . Influenza Split 04/20/2011  . Influenza Whole 06/06/2007, 04/15/2008, 04/02/2009, 03/29/2012  . Influenza, High Dose Seasonal PF 05/05/2015  . Influenza,inj,Quad PF,6+ Mos 05/09/2013, 03/03/2014, 05/03/2016  . Pneumococcal Conjugate-13 05/09/2013  . Pneumococcal Polysaccharide-23 05/04/2005  . Td 07/29/2009  . Tdap 03/11/2015    Health Maintenance  Topic Date Due  . INFLUENZA VACCINE  01/18/2017  . MAMMOGRAM  06/19/2017 (Originally 05/07/2007)  . PAP SMEAR  06/19/2017 (Originally 12/03/2012)  . COLONOSCOPY  06/19/2017 (Originally 05/07/2007)  . TETANUS/TDAP  03/10/2025  . Hepatitis C Screening  Completed  . HIV Screening  Completed    Lab Results  Component Value Date   WBC 11.1 (H) 06/10/2016   HGB 11.2 (L) 06/10/2016   HCT 34.3 (L) 06/10/2016   PLT 277 06/10/2016   GLUCOSE 148 (H) 06/10/2016   CHOL 227 (H) 07/25/2016   TRIG 81 07/25/2016   HDL 51 07/25/2016   LDLDIRECT 177.8 09/07/2011   LDLCALC 160 (H) 07/25/2016   ALT 54 06/09/2016   AST 30 06/09/2016   NA 138 06/10/2016   K 3.5 06/10/2016   CL 105 06/10/2016   CREATININE 0.63 06/10/2016   BUN 10 06/10/2016   CO2 26 06/10/2016   TSH 1.78 11/20/2015   INR 1.0 11/19/2014   HGBA1C 5.5 11/20/2015    Lab Results  Component Value Date   TSH 1.78 11/20/2015   Lab Results  Component Value Date   WBC 11.1 (H) 06/10/2016   HGB 11.2 (L) 06/10/2016   HCT 34.3 (L) 06/10/2016   MCV 89.3 06/10/2016   PLT 277 06/10/2016   Lab Results  Component Value Date   NA 138 06/10/2016   K 3.5 06/10/2016   CO2 26 06/10/2016   GLUCOSE 148 (H) 06/10/2016   BUN 10 06/10/2016   CREATININE 0.63 06/10/2016   BILITOT 0.4 06/09/2016   ALKPHOS 108 06/09/2016   AST 30 06/09/2016   ALT 54 06/09/2016   PROT 5.7 (L)  06/09/2016   ALBUMIN 2.6 (L) 06/09/2016   CALCIUM 8.5 (L) 06/10/2016   ANIONGAP 7 06/10/2016   GFR 89.70 11/20/2015   Lab Results  Component Value Date   CHOL 227 (H) 07/25/2016   Lab Results  Component Value Date   HDL 51 07/25/2016   Lab Results  Component Value Date   LDLCALC 160 (H) 07/25/2016   Lab Results  Component Value Date   TRIG 81 07/25/2016   Lab Results  Component Value Date   CHOLHDL 4.5 (H) 07/25/2016   Lab Results  Component Value Date   HGBA1C 5.5 11/20/2015         Assessment & Plan:   Problem List Items Addressed This Visit    Hyperglycemia (Chronic)    hgba1c acceptable, minimize simple carbs. Increase exercise as tolerated.       Relevant Orders   Hemoglobin A1c   TSH   Comprehensive metabolic panel   BMI 70.6-23.7,SEGBT (Chronic)    Encouraged DASH diet, decrease po intake and increase exercise as tolerated. Needs 7-8 hours of sleep nightly. Avoid trans fats, eat small, frequent meals every 4-5 hours with lean proteins, complex carbs and healthy fats. Minimize simple carbs      Chronic thoracic back pain - Primary (Chronic)   Relevant Medications   meloxicam (  MOBIC) 7.5 MG tablet   venlafaxine XR (EFFEXOR-XR) 75 MG 24 hr capsule   Other Relevant Orders   Ambulatory referral to Chiropractic   DG Thoracic Spine 2 View (Completed)   CBC   TSH   Hyperlipidemia, mixed    Encouraged heart healthy diet, increase exercise, avoid trans fats, consider a krill oil cap daily      Relevant Orders   Lipid panel   Esophageal reflux    Avoid offending foods, start probiotics. Do not eat large meals in late evening and consider raising head of bed.       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[place])    She has been doing much better with the addition of [place]. She is following closely with pulmonology.       RESOLVED: Anxiety and depression   Relevant Medications   venlafaxine XR (EFFEXOR-XR) 75 MG 24 hr capsule   Low back pain    With some increased  right hip pain over past few months. Will try a course of chiropractic care and she will report if worsens.       Relevant Medications   meloxicam (MOBIC) 7.5 MG tablet   Other Relevant Orders   Ambulatory referral to Chiropractic   DG Thoracic Spine 2 View (Completed)   DG Lumbar Spine Complete (Completed)   CBC   TSH      I have discontinued Ms. Stansbury's Coenzyme Q10 (CO Q 10 PO), OMEGA-3 + VITAMIN D3 ULTRA STR, Probiotic Product (PROBIOTIC-10 PO), mupirocin nasal ointment, and predniSONE. I am also having her start on meloxicam. Additionally, I am having her maintain her Vitamin D, aspirin, fexofenadine, furosemide, ibuprofen, Omeprazole-Sodium Bicarbonate (ZEGERID OTC PO), fluticasone, ipratropium-albuterol, OVER THE COUNTER MEDICATION, Melatonin, cyclobenzaprine, benzonatate, guaiFENesin, HYDROcodone-homatropine, Evolocumab, reslizumab, nitroGLYCERIN, chlorpheniramine-HYDROcodone, montelukast, tiotropium, mometasone, ALPRAZolam, and venlafaxine XR.  Meds ordered this encounter  Medications  . meloxicam (MOBIC) 7.5 MG tablet    Sig: Take 1-2 tablets (7.5-15 mg total) by mouth daily.    Dispense:  60 tablet    Refill:  3  . venlafaxine XR (EFFEXOR-XR) 75 MG 24 hr capsule    Sig: TAKE 1 CAPSULE BY MOUTH ONCE DAILY ALONG WITH 37.5 MG CAPSULE DAILY. (112.5 MG TOTAL DAILY)    Dispense:  180 capsule    Refill:  1    Needs appt    CMA served as scribe during this visit. History, Physical and Plan performed by medical provider. Documentation and orders reviewed and attested to.  [name], MD

## 2017-03-02 NOTE — Patient Instructions (Addendum)
Shingrix is the new shingles shot, 2 shots over 6 months  Generalized Anxiety Disorder, Adult Generalized anxiety disorder (GAD) is a mental health disorder. People with this condition constantly worry about everyday events. Unlike normal anxiety, worry related to GAD is not triggered by a specific event. These worries also do not fade or get better with time. GAD interferes with life functions, including relationships, work, and school. GAD can vary from mild to severe. People with severe GAD can have intense waves of anxiety with physical symptoms (panic attacks). What are the causes? The exact cause of GAD is not known. What increases the risk? This condition is more likely to develop in:  Women.  People who have a family history of anxiety disorders.  People who are very shy.  People who experience very stressful life events, such as the death of a loved one.  People who have a very stressful family environment.  What are the signs or symptoms? People with GAD often worry excessively about many things in their lives, such as their health and family. They may also be overly concerned about:  Doing well at work.  Being on time.  Natural disasters.  Friendships.  Physical symptoms of GAD include:  Fatigue.  Muscle tension or having muscle twitches.  Trembling or feeling shaky.  Being easily startled.  Feeling like your heart is pounding or racing.  Feeling out of breath or like you cannot take a deep breath.  Having trouble falling asleep or staying asleep.  Sweating.  Nausea, diarrhea, or irritable bowel syndrome (IBS).  Headaches.  Trouble concentrating or remembering facts.  Restlessness.  Irritability.  How is this diagnosed? Your health care provider can diagnose GAD based on your symptoms and medical history. You will also have a physical exam. The health care provider will ask specific questions about your symptoms, including how severe they are,  when they started, and if they come and go. Your health care provider may ask you about your use of alcohol or drugs, including prescription medicines. Your health care provider may refer you to a mental health specialist for further evaluation. Your health care provider will do a thorough examination and may perform additional tests to rule out other possible causes of your symptoms. To be diagnosed with GAD, a person must have anxiety that:  Is out of his or her control.  Affects several different aspects of his or her life, such as work and relationships.  Causes distress that makes him or her unable to take part in normal activities.  Includes at least three physical symptoms of GAD, such as restlessness, fatigue, trouble concentrating, irritability, muscle tension, or sleep problems.  Before your health care provider can confirm a diagnosis of GAD, these symptoms must be present more days than they are not, and they must last for six months or longer. How is this treated? The following therapies are usually used to treat GAD:  Medicine. Antidepressant medicine is usually prescribed for long-term daily control. Antianxiety medicines may be added in severe cases, especially when panic attacks occur.  Talk therapy (psychotherapy). Certain types of talk therapy can be helpful in treating GAD by providing support, education, and guidance. Options include: ? Cognitive behavioral therapy (CBT). People learn coping skills and techniques to ease their anxiety. They learn to identify unrealistic or negative thoughts and behaviors and to replace them with positive ones. ? Acceptance and commitment therapy (ACT). This treatment teaches people how to be mindful as a way to cope  with unwanted thoughts and feelings. ? Biofeedback. This process trains you to manage your body's response (physiological response) through breathing techniques and relaxation methods. You will work with a therapist while  machines are used to monitor your physical symptoms.  Stress management techniques. These include yoga, meditation, and exercise.  A mental health specialist can help determine which treatment is best for you. Some people see improvement with one type of therapy. However, other people require a combination of therapies. Follow these instructions at home:  Take over-the-counter and prescription medicines only as told by your health care provider.  Try to maintain a normal routine.  Try to anticipate stressful situations and allow extra time to manage them.  Practice any stress management or self-calming techniques as taught by your health care provider.  Do not punish yourself for setbacks or for not making progress.  Try to recognize your accomplishments, even if they are small.  Keep all follow-up visits as told by your health care provider. This is important. Contact a health care provider if:  Your symptoms do not get better.  Your symptoms get worse.  You have signs of depression, such as: ? A persistently sad, cranky, or irritable mood. ? Loss of enjoyment in activities that used to bring you joy. ? Change in weight or eating. ? Changes in sleeping habits. ? Avoiding friends or family members. ? Loss of energy for normal tasks. ? Feelings of guilt or worthlessness. Get help right away if:  You have serious thoughts about hurting yourself or others. If you ever feel like you may hurt yourself or others, or have thoughts about taking your own life, get help right away. You can go to your nearest emergency department or call:  Your local emergency services (911 in the [place]  A suicide crisis helpline, such as the [facility] at 445-461-6691. This is open 24 hours a day.  Summary  Generalized anxiety disorder (GAD) is a mental health disorder that involves worry that is not triggered by a specific event.  People with GAD often worry  excessively about many things in their lives, such as their health and family.  GAD may cause physical symptoms such as restlessness, trouble concentrating, sleep problems, frequent sweating, nausea, diarrhea, headaches, and trembling or muscle twitching.  A mental health specialist can help determine which treatment is best for you. Some people see improvement with one type of therapy. However, other people require a combination of therapies. This information is not intended to replace advice given to you by your health care provider. Make sure you discuss any questions you have with your health care provider. Document Released: 10/01/2012 Document Revised: 04/26/2016 Document Reviewed: 04/26/2016 Elsevier Interactive Patient Education  [employer].

## 2017-03-05 DIAGNOSIS — M545 Low back pain, unspecified: Secondary | ICD-10-CM | POA: Insufficient documentation

## 2017-03-05 NOTE — Assessment & Plan Note (Signed)
Encouraged DASH diet, decrease po intake and increase exercise as tolerated. Needs 7-8 hours of sleep nightly. Avoid trans fats, eat small, frequent meals every 4-5 hours with lean proteins, complex carbs and healthy fats. Minimize simple carbs 

## 2017-03-05 NOTE — Assessment & Plan Note (Signed)
Avoid offending foods, start probiotics. Do not eat large meals in late evening and consider raising head of bed.  

## 2017-03-05 NOTE — Assessment & Plan Note (Signed)
With some increased right hip pain over past few months. Will try a course of chiropractic care and she will report if worsens.

## 2017-03-05 NOTE — Assessment & Plan Note (Signed)
She has been doing much better with the addition of Cinqair. She is following closely with pulmonology.

## 2017-03-08 ENCOUNTER — Other Ambulatory Visit (HOSPITAL_COMMUNITY): Payer: Self-pay | Admitting: *Deleted

## 2017-03-08 ENCOUNTER — Other Ambulatory Visit (HOSPITAL_COMMUNITY): Payer: Self-pay | Admitting: Pharmacy Technician

## 2017-03-09 ENCOUNTER — Ambulatory Visit (HOSPITAL_COMMUNITY)
Admission: RE | Admit: 2017-03-09 | Discharge: 2017-03-09 | Disposition: A | Discharge status: Home / Self Care | Payer: PPO | Source: Ambulatory Visit | Attending: Internal Medicine | Admitting: Internal Medicine

## 2017-03-09 DIAGNOSIS — J4550 Severe persistent asthma, uncomplicated: Secondary | ICD-10-CM | POA: Insufficient documentation

## 2017-03-09 MED ORDER — SODIUM CHLORIDE 0.9 % IV SOLN
225.0000 mg | INTRAVENOUS | Status: DC
  Administered 2017-03-09: 230 mg via INTRAVENOUS
  Filled 2017-03-09: qty 23

## 2017-04-06 ENCOUNTER — Encounter (HOSPITAL_COMMUNITY): Payer: PPO

## 2017-04-06 ENCOUNTER — Other Ambulatory Visit: Payer: PPO

## 2017-04-10 ENCOUNTER — Other Ambulatory Visit: Payer: Self-pay | Admitting: Family Medicine

## 2017-04-10 ENCOUNTER — Telehealth: Payer: Self-pay | Admitting: Internal Medicine

## 2017-04-10 ENCOUNTER — Encounter: Payer: Self-pay | Admitting: Family Medicine

## 2017-04-10 DIAGNOSIS — M419 Scoliosis, unspecified: Secondary | ICD-10-CM

## 2017-04-10 NOTE — Telephone Encounter (Signed)
Call made in error-tr

## 2017-04-10 NOTE — Telephone Encounter (Signed)
Note taken in error.-tr

## 2017-04-11 ENCOUNTER — Inpatient Hospital Stay (HOSPITAL_COMMUNITY): Admission: RE | Admit: 2017-04-11 | Payer: PPO | Source: Ambulatory Visit

## 2017-04-12 ENCOUNTER — Other Ambulatory Visit (INDEPENDENT_AMBULATORY_CARE_PROVIDER_SITE_OTHER): Payer: PPO

## 2017-04-12 ENCOUNTER — Telehealth: Payer: Self-pay | Admitting: Family Medicine

## 2017-04-12 DIAGNOSIS — E782 Mixed hyperlipidemia: Secondary | ICD-10-CM | POA: Diagnosis not present

## 2017-04-12 DIAGNOSIS — R739 Hyperglycemia, unspecified: Secondary | ICD-10-CM | POA: Diagnosis not present

## 2017-04-12 DIAGNOSIS — M546 Pain in thoracic spine: Secondary | ICD-10-CM | POA: Diagnosis not present

## 2017-04-12 DIAGNOSIS — M545 Low back pain: Secondary | ICD-10-CM

## 2017-04-12 DIAGNOSIS — G8929 Other chronic pain: Secondary | ICD-10-CM

## 2017-04-12 LAB — COMPREHENSIVE METABOLIC PANEL
ALT: 15 U/L (ref 0–35)
AST: 15 U/L (ref 0–37)
Albumin: 3.9 g/dL (ref 3.5–5.2)
Alkaline Phosphatase: 76 U/L (ref 39–117)
BUN: 15 mg/dL (ref 6–23)
CO2: 30 mEq/L (ref 19–32)
Calcium: 9.2 mg/dL (ref 8.4–10.5)
Chloride: 106 mEq/L (ref 96–112)
Creatinine, Ser: 0.63 mg/dL (ref 0.40–1.20)
GFR: 102.47 mL/min (ref >60.00)
GLUCOSE: 95 mg/dL (ref 70–99)
POTASSIUM: 4.7 meq/L (ref 3.5–5.1)
SODIUM: 142 meq/L (ref 135–145)
TOTAL PROTEIN: 6.2 g/dL (ref 6.0–8.3)
Total Bilirubin: 0.5 mg/dL (ref 0.2–1.2)

## 2017-04-12 LAB — LIPID PANEL
Cholesterol: 130 mg/dL (ref 0–200)
HDL: 55.2 mg/dL (ref >39.00)
LDL CALC: 53 mg/dL (ref 0–99)
NONHDL: 75.21
Total CHOL/HDL Ratio: 2
Triglycerides: 111 mg/dL (ref 0.0–149.0)
VLDL: 22.2 mg/dL (ref 0.0–40.0)

## 2017-04-12 LAB — CBC
HEMATOCRIT: 40.2 % (ref 36.0–46.0)
HEMOGLOBIN: 13.1 g/dL (ref 12.0–15.0)
MCHC: 32.6 g/dL (ref 30.0–36.0)
MCV: 90.7 fl (ref 78.0–100.0)
Platelets: 317 10*3/uL (ref 150.0–400.0)
RBC: 4.44 Mil/uL (ref 3.87–5.11)
RDW: 14.1 % (ref 11.5–15.5)
WBC: 4.7 10*3/uL (ref 4.0–10.5)

## 2017-04-12 LAB — TSH: TSH: 3.16 u[IU]/mL (ref 0.35–4.50)

## 2017-04-12 LAB — HEMOGLOBIN A1C: HEMOGLOBIN A1C: 5.7 % (ref 4.6–6.5)

## 2017-04-12 NOTE — Telephone Encounter (Signed)
Pt dropped off a copy of POA for Dr. Charlett Blake, documents placed in tray at front office

## 2017-04-13 ENCOUNTER — Other Ambulatory Visit: Payer: Self-pay | Admitting: Family Medicine

## 2017-04-13 ENCOUNTER — Encounter: Payer: Self-pay | Admitting: Cardiology

## 2017-04-13 DIAGNOSIS — M25551 Pain in right hip: Secondary | ICD-10-CM

## 2017-04-13 DIAGNOSIS — M25552 Pain in left hip: Principal | ICD-10-CM

## 2017-04-13 NOTE — Progress Notes (Unsigned)
.  ip

## 2017-04-17 NOTE — Telephone Encounter (Signed)
Paperwork numbered and forwarded to provider for initialing/SLS 10/29

## 2017-04-18 ENCOUNTER — Ambulatory Visit: Payer: PPO | Admitting: Internal Medicine

## 2017-04-18 ENCOUNTER — Ambulatory Visit (HOSPITAL_BASED_OUTPATIENT_CLINIC_OR_DEPARTMENT_OTHER)
Admission: RE | Admit: 2017-04-18 | Discharge: 2017-04-18 | Disposition: A | Discharge status: Home / Self Care | Payer: PPO | Source: Ambulatory Visit | Attending: Family Medicine | Admitting: Family Medicine

## 2017-04-18 DIAGNOSIS — M25552 Pain in left hip: Secondary | ICD-10-CM | POA: Diagnosis not present

## 2017-04-18 DIAGNOSIS — M25551 Pain in right hip: Secondary | ICD-10-CM | POA: Insufficient documentation

## 2017-04-20 ENCOUNTER — Telehealth: Payer: Self-pay | Admitting: Internal Medicine

## 2017-04-20 NOTE — Telephone Encounter (Signed)
Spoke with [place] with [employer], states that she was calling to see if we had received a PA for pt's Cinqair- pt's authorization will expire next month.  I advised that per 9/12 phone note it appeared that we had looked into the authorization and we could not submit a PA d/t one note being needed.  Hartley Barefoot states she will look further into this and will contact us if anything further is needed on our end. Will close encounter.

## 2017-04-21 ENCOUNTER — Ambulatory Visit (INDEPENDENT_AMBULATORY_CARE_PROVIDER_SITE_OTHER): Payer: PPO | Admitting: Internal Medicine

## 2017-04-21 ENCOUNTER — Encounter: Payer: Self-pay | Admitting: Internal Medicine

## 2017-04-21 VITALS — BP 114/76 | HR 86 | Ht 62.0 in | Wt 163.0 lb

## 2017-04-21 DIAGNOSIS — B4481 Allergic bronchopulmonary aspergillosis: Secondary | ICD-10-CM

## 2017-04-21 DIAGNOSIS — J4550 Severe persistent asthma, uncomplicated: Secondary | ICD-10-CM | POA: Diagnosis not present

## 2017-04-21 DIAGNOSIS — J8283 Eosinophilic asthma: Secondary | ICD-10-CM

## 2017-04-21 DIAGNOSIS — J82 Pulmonary eosinophilia, not elsewhere classified: Secondary | ICD-10-CM

## 2017-04-21 LAB — NITRIC OXIDE: NITRIC OXIDE: 42

## 2017-04-21 NOTE — Patient Instructions (Addendum)
ICD-10-CM   1. Severe persistent asthma with intensive monitoring J45.50   2. 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[place]) J28   3. A B P A-ALLERGIC BRONCHOPULMONARY ASPERGILLOSIS B44.81    Continue spiriva, singulair and asmanex Flu shot 04/21/2017 Continue cinqair Change to [facility] due poor billing cycle management at [facility]   Followup 3-6 months or sooner if neede  - feno at followup  - spirometry with dlco (Pre-bd spiro and dlco only. No lung volume or bd response) at followp

## 2017-04-21 NOTE — Addendum Note (Signed)
Addended by: Lorretta Harp on: 04/21/2017 11:02 AM   Modules accepted: Orders

## 2017-04-21 NOTE — Progress Notes (Signed)
Subjective:     Patient ID: Victoria [name], female   DOB: February 24, 1956, 61 y.o.   MRN: [id number]  HPI      OV 10/06/2016  Chief Complaint  Patient presents with  . Follow-up    Pt states her asthma is good; had one flare up since last visit. SOB with exertion, cough with yellow to brown mucus prodcution, with frequent wheezing. Notice improvement with being on [employer]      61 year old female with severe asthma with ABPA and elevated IgE and eosinophilia. She is on IL5RAb antibody treatment now for the last few months. She feels this is helping her significantly. Her symptoms course of better. She is on Asmanex, Spiriva, Singulair as well. She is not on daily prednisone anymore. In December 2017 should she did grow MRSA from sputum culture. Currently a few days ago she developed sore throat and having some sputum production. She is worried this might be MRSA. Also asthma itself is stable and well controlled according to history. She is wondering about getting a sputum culture and really wishes for it    OV 01/11/2017  Chief Complaint  Patient presents with  . Follow-up    Pt states her breathing is doing well. Pt states the last couple days she has had more wheezing. Pt states she has noticed an improvement being on Cinqair.    61 year old female with severe asthma with ABPA and elevated IgE and eosinophilia. She is on IL5RAb antibody treatment.  She is on Asmanex, Spiriva, Singulair as well. She is not on daily prednisone anymore   01/11/2017 - Here for routine follow-up. She is on interleukin-5 receptor antibody IV infusion therapy. This was held a few weeks ago because of asthma exacerbation. She says approximately on Father's Day 2018 he started feeling unwell and then ended up with an exacerbation for which she needed antibiotics and prednisone. She is surprised by this decompensation despite being on this biologic immunotherapy for asthma. She is compliant with her Asmanex, Spiriva and  Singulair. She is not on prednisone. Currently she feels baseline. Patient infusion #6 or 7 is due for tomorrow [interleukin-5 receptor antibody). Overall she feels improved quality of life. Despite all this exhaled nitric oxide continues to be HIGH; unclear why   OV 04/21/2017  Chief Complaint  Patient presents with  . Follow-up    Pt states that she has been doing good. Has had no flare-ups with her asthma. Mild coughing at night. Denies any SOB or CP.  severe asthma with ABPA and elevated IgE and eosinophilia. She is on IL5RAb antibody treatment.  She is on Asmanex, Spiriva, Singulair as well. She is not on daily prednisone anymore  Continues cinqair spiriva, singulair, and asmanex. At this point in time the interleukin-5 receptor antibody is working really well for her. For nitric oxide today is lowest ever.. In terms of his symptoms she hardly ever gets up at night. When she wakes up she does not have any symptoms. She only has slight limitation with physical activity. She's not shortness of breath because of asthma not wheezing and not using albuterol for rescue use. The asthma control score is 0.8. However she is extremely frustrated by the [facility] gravida cycle management of her interleukin-5 receptor antibody infusions. She is getting different bills wrong bills missed bills. Therefore she wants to switch to subcutaneous injectable therapy. She is very upset about the quality of care that she has received at the infusion center because of billing practices.  Results for Victoria [name] (MRN [id number]) as of 04/21/2017 10:32  Ref. 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   reports that she has never smoked. She has never used smokeless tobacco.  Past Surgical History:  Procedure Laterality Date  . APPENDECTOMY  1989  . CARDIAC CATHETERIZATION N/A 11/25/2014   Procedure: Right/Left Heart Cath and Coronary Angiography;  Surgeon: Belva Crome, MD;  Location: [facility];  Service: Cardiovascular;  Laterality: N/A;  . CESAREAN SECTION  1985  . HERNIA REPAIR  04/13/2012   VHR laparoscopic  . VENTRAL HERNIA REPAIR  04/13/2012   Procedure: LAPAROSCOPIC VENTRAL HERNIA;  Surgeon: Adin Hector, MD;  Location: [facility];  Service: General;  Laterality: N/A;  laparoscopic repair of incarcerated hernia    Allergies  Allergen Reactions  . Beclomethasone Dipropionate Hives and Other (See Comments)     weight gain  . Budesonide-Formoterol Fumarate Hives  . Mometasone Furo-Formoterol Fum Hives and Other (See Comments)    weight gain  . Sulfonamide Derivatives Hives and Rash  . Pulmicort [Budesonide]     Headaches   . Statins     Myalgias, RLS    Immunization History  Administered Date(s) Administered  . Influenza Split 04/20/2011  . Influenza Whole 06/06/2007, 04/15/2008, 04/02/2009, 03/29/2012  . Influenza, High Dose Seasonal PF 05/05/2015  . Influenza,inj,Quad PF,6+ Mos 05/09/2013, 03/03/2014, 05/03/2016  . Pneumococcal Conjugate-13 05/09/2013  . Pneumococcal Polysaccharide-23 05/04/2005  . Td 07/29/2009  . Tdap 03/11/2015    Family History  Problem Relation Age of Onset  . Breast cancer Mother    . Hypertension Mother   . Diabetes Mother   . Diverticulosis Father   . Prostate cancer Father   . Pulmonary embolism Brother        recurrent  . Heart attack Maternal Grandfather   . Breast cancer Sister   . Stroke Neg Hx      Current Outpatient Prescriptions:  .  ALPRAZolam (XANAX) 0.5 MG tablet, Take 1 tablet (0.5 mg total) by mouth at bedtime as needed for anxiety (Palpatations, SOB)., Disp: 30 tablet, Rfl: 0 .  aspirin 81 MG tablet, Take 81 mg by mouth daily. , Disp: , Rfl:  .  chlorpheniramine-HYDROcodone (TUSSIONEX PENNKINETIC ER) 10-8 MG/5ML SUER, Take 5 mLs by mouth., Disp: , Rfl:  .  Cholecalciferol (VITAMIN D) 2000 UNITS CAPS, Take 2,000 Units by mouth daily. , Disp: , Rfl:  .  cyclobenzaprine (FLEXERIL) 10 MG tablet, Take 1 tablet (10 mg total) by mouth 3 (three) times daily as needed for muscle spasms., Disp: 30 tablet, Rfl: 0 .  Evolocumab (REPATHA SURECLICK) 053 MG/ML SOAJ, Inject 1 pen into the skin every 14 (fourteen) days., Disp: 2 pen, Rfl: 11 .  fexofenadine (ALLEGRA) 180 MG tablet, Take 180 mg by mouth daily as needed for allergies. , Disp: , Rfl:  .  fluticasone (FLONASE) 50 MCG/ACT nasal spray, Place 2 sprays into both nostrils  daily., Disp: 16 g, Rfl: 1 .  guaiFENesin (MUCINEX) 600 MG 12 hr tablet, Take 2 tablets (1,200 mg total) by mouth 2 (two) times daily as needed., Disp: 30 tablet, Rfl: 1 .  HYDROcodone-homatropine (HYDROMET) 5-1.5 MG/5ML syrup, Take 5 mLs by mouth every 6 (six) hours as needed for cough., Disp: 240 mL, Rfl: 0 .  ibuprofen (ADVIL,MOTRIN) 200 MG tablet, Take 200 mg by mouth every 6 (six) hours as needed for mild pain or moderate pain., Disp: , Rfl:  .  ipratropium-albuterol (DUONEB) 0.5-2.5 (3) MG/3ML SOLN, INHALE THE CONTENTS OF 1 VIAL VIA NEBULIZER UP TO 4 TO 6 TIMES A DAY., Disp: 360 mL, Rfl: 2 .  meloxicam (MOBIC) 7.5 MG tablet, Take 1-2 tablets (7.5-15 mg total) by mouth daily., Disp: 60 tablet, Rfl: 3 .  mometasone (ASMANEX 120 METERED  DOSES) 220 MCG/INH inhaler, Inhale 2 puffs into the lungs 2 (two) times daily., Disp: 1 Inhaler, Rfl: 12 .  montelukast (SINGULAIR) 10 MG tablet, TAKE 1 TABLET BY MOUTH ONCE DAILY, Disp: 90 tablet, Rfl: 5 .  nitroGLYCERIN (NITROSTAT) 0.4 MG SL tablet, Place 1 tablet (0.4 mg total) under the tongue every 5 (five) minutes as needed for chest pain., Disp: 25 tablet, Rfl: 11 .  Omeprazole-Sodium Bicarbonate (ZEGERID OTC PO), Take 1 tablet by mouth daily., Disp: , Rfl:  .  OVER THE COUNTER MEDICATION, Take 1 capsule by mouth daily. Tumeric, Disp: , Rfl:  .  reslizumab (CINQAIR) 100 MG/10ML SOLN injection, Inject into the vein every 30 (thirty) days., Disp: , Rfl:  .  tiotropium (SPIRIVA HANDIHALER) 18 MCG inhalation capsule, INHALE THE CONTENTS OF ONE CAPSULE DAILY, Disp: 90 capsule, Rfl: 3 .  venlafaxine (EFFEXOR) 37.5 MG tablet, Take 37.5 mg by mouth 2 (two) times daily., Disp: , Rfl:  .  venlafaxine XR (EFFEXOR-XR) 75 MG 24 hr capsule, TAKE 1 CAPSULE BY MOUTH ONCE DAILY ALONG WITH 37.5 MG CAPSULE DAILY. (112.5 MG TOTAL DAILY), Disp: 180 capsule, Rfl: 1   Review of Systems     Objective:   Physical Exam  Constitutional: She is oriented to person, place, and time. She appears well-developed and well-nourished. No distress.  HENT:  Head: Normocephalic and atraumatic.  Right Ear: External ear normal.  Left Ear: External ear normal.  Mouth/Throat: Oropharynx is clear and moist. No oropharyngeal exudate.  Eyes: Pupils are equal, round, and reactive to light. Conjunctivae and EOM are normal. Right eye exhibits no discharge. Left eye exhibits no discharge. No scleral icterus.  Neck: Normal range of motion. Neck supple. No JVD present. No tracheal deviation present. No thyromegaly present.  Cardiovascular: Normal rate, regular rhythm, normal heart sounds and intact distal pulses.  Exam reveals no gallop and no friction rub.   No murmur heard. Pulmonary/Chest: Effort normal and breath sounds normal. No  respiratory distress. She has no wheezes. She has no rales. She exhibits no tenderness.  Abdominal: Soft. Bowel sounds are normal. She exhibits no distension and no mass. There is no tenderness. There is no rebound and no guarding.  Musculoskeletal: Normal range of motion. She exhibits no edema or tenderness.  Lymphadenopathy:    She has no cervical adenopathy.  Neurological: She is alert and oriented to person, place, and time. She has normal reflexes. No cranial nerve deficit. She exhibits normal muscle tone. Coordination normal.  Skin: Skin is warm and dry. No rash noted. She is not diaphoretic. No erythema. No pallor.  Psychiatric: She has a normal mood and affect. Her behavior is  normal. Judgment and thought content normal.  Vitals reviewed.   Vitals:   04/21/17 1004  BP: 114/76  Pulse: 86  SpO2: 94%  Weight: 163 lb (73.9 kg)  Height: 5\' 2"  (1.575 m)    Estimated body mass index is 29.81 kg/m as calculated from the following:   Height as of this encounter: 5\' 2"  (1.575 m).   Weight as of this encounter: 163 lb (73.9 kg).      Assessment:       ICD-10-CM   1. Severe persistent asthma with intensive monitoring J45.50   2. 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[place]) P50   3. A B P A-ALLERGIC BRONCHOPULMONARY ASPERGILLOSIS B44.81        Plan:     Continue spiriva, singulair and asmanex Flu shot 04/21/2017 Continue cinqair Change to [facility] due poor billing cycle management at [facility]   Followup 3-6 months or sooner if neede  - feno at followup  - spirometry with dlco (Pre-bd spiro and dlco only. No lung volume or bd response) at followp  > 50% of this > 25 min visit spent in face to face counseling or coordination of care    Dr. Brand Males, M.D., [facility].C.P Pulmonary and Critical Care Medicine Staff Physician [place] [facility] Pager: [phone], If no answer or between  15:00h - 7:00h: call [phone]  04/21/2017 10:50 AM

## 2017-04-27 DIAGNOSIS — M4125 Other idiopathic scoliosis, thoracolumbar region: Secondary | ICD-10-CM | POA: Diagnosis not present

## 2017-04-27 DIAGNOSIS — R262 Difficulty in walking, not elsewhere classified: Secondary | ICD-10-CM | POA: Diagnosis not present

## 2017-04-27 DIAGNOSIS — M545 Low back pain: Secondary | ICD-10-CM | POA: Diagnosis not present

## 2017-04-29 DIAGNOSIS — R262 Difficulty in walking, not elsewhere classified: Secondary | ICD-10-CM | POA: Diagnosis not present

## 2017-04-29 DIAGNOSIS — M4125 Other idiopathic scoliosis, thoracolumbar region: Secondary | ICD-10-CM | POA: Diagnosis not present

## 2017-04-29 DIAGNOSIS — M545 Low back pain: Secondary | ICD-10-CM | POA: Diagnosis not present

## 2017-05-02 ENCOUNTER — Other Ambulatory Visit (HOSPITAL_COMMUNITY): Payer: Self-pay | Admitting: *Deleted

## 2017-05-03 ENCOUNTER — Ambulatory Visit (HOSPITAL_COMMUNITY)
Admission: RE | Admit: 2017-05-03 | Discharge: 2017-05-03 | Disposition: A | Discharge status: Home / Self Care | Payer: PPO | Source: Ambulatory Visit | Attending: Internal Medicine | Admitting: Internal Medicine

## 2017-05-03 DIAGNOSIS — J4550 Severe persistent asthma, uncomplicated: Secondary | ICD-10-CM | POA: Diagnosis not present

## 2017-05-03 MED ORDER — SODIUM CHLORIDE 0.9 % IV SOLN
225.0000 mg | INTRAVENOUS | Status: DC
  Administered 2017-05-03: 230 mg via INTRAVENOUS
  Filled 2017-05-03: qty 23

## 2017-05-08 ENCOUNTER — Telehealth: Payer: Self-pay | Admitting: Internal Medicine

## 2017-05-08 NOTE — Telephone Encounter (Signed)
The recommended dose of FASENRA is 30 mg administered once every 4 weeks for the first 3 doses, and then once every 8 weeks thereafter by subcutaneous injection into the upper arm, thigh, or abdomen.1  FASENRA is for subcutaneous use only.1  FASENRA should be administered by a healthcare professional. In line with clinical practice, monitoring of patients after administration of biologic agents is recommended. Prior to administration, warm FASENRA by leaving carton at room temperature for about 30 minutes. Administer FASENRA within 24 hours or discard into sharps container.[place] is NOT a tablet  You can do 90d supply but this might mean practice might have to stock  Up - check with Keith/Tammy on office policy  Dr. Brand Males, M.D., [facility].C.P Pulmonary and Critical Care Medicine Staff Physician [place] [facility] Pager: [phone], If no answer or between  15:00h - 7:00h: call [phone]  05/08/2017 2:23 PM

## 2017-05-08 NOTE — Telephone Encounter (Signed)
Routing to [employer] for follow-up.

## 2017-05-08 NOTE — Telephone Encounter (Signed)
Spoke with CSR [facility] health [employer], Victoria [name], and she states she needs to know the quantity of Victoria [name] in a 90 day period. She states the directions state, take one tablet every four weeks but we put a quantity of 7. Is this correct or do we need to change to #3? MR please clarify.   [phone]  Clinical information needs office visit or reasoning on why she needs the [facility] [phone], Ref [phone] Attn clinical

## 2017-05-08 NOTE — Telephone Encounter (Signed)
EP did you do any paper for the pt to get set up on [place]?

## 2017-05-09 ENCOUNTER — Other Ambulatory Visit: Payer: Self-pay | Admitting: Internal Medicine

## 2017-05-09 DIAGNOSIS — J82 Pulmonary eosinophilia, not elsewhere classified: Principal | ICD-10-CM

## 2017-05-09 DIAGNOSIS — J8283 Eosinophilic asthma: Secondary | ICD-10-CM

## 2017-05-09 NOTE — Progress Notes (Signed)
Spoke with patient-she is aware that we need updated CBC Diff for Victoria [name] as well as come up to speak with Victoria [name] to sign forms for [place].   Order placed and nothing more needed at this time.

## 2017-05-10 NOTE — Telephone Encounter (Signed)
TS please advise. Thanks.

## 2017-05-10 NOTE — Telephone Encounter (Signed)
[employer] called they need more clinical info.. I was looking for her most recent CBC with diff, it was in 01/2016. She is coming in one day next week to get CBC with diff drawn. Then I can send all the info. Her ins. Needs. Waiting. ([name] spoke with pt.)

## 2017-05-10 NOTE — Telephone Encounter (Signed)
Debbie with [employer], [phone].  States has been waiting for clinical on this since Fri, 11/16 and has not recd.  The case has been sent to medical review.

## 2017-05-15 NOTE — Telephone Encounter (Signed)
Received a fax of a denial letter for pt's medication. Gave information to [employer] for her to follow up on.

## 2017-05-16 NOTE — Telephone Encounter (Signed)
Patient found application on line for [place] for [name] online and she wants to make sure this is okay for her to print, fill out, and bring it by the office.  CB is [phone].

## 2017-05-16 NOTE — Telephone Encounter (Signed)
Called and spoke with pt. Pt states she upon research she found application for [employer].  Pt wanted to know if she can complete forms and bring them in to be faxed. After speaking with Alroy Bailiff, it does appear that [place] application online is the same as the one Tammy has.  Pt aware that she can bring application by our office once complete.  Nothing further is needed at this time.

## 2017-05-18 ENCOUNTER — Other Ambulatory Visit (INDEPENDENT_AMBULATORY_CARE_PROVIDER_SITE_OTHER): Payer: PPO

## 2017-05-18 DIAGNOSIS — J82 Pulmonary eosinophilia, not elsewhere classified: Secondary | ICD-10-CM | POA: Diagnosis not present

## 2017-05-18 DIAGNOSIS — J8283 Eosinophilic asthma: Secondary | ICD-10-CM

## 2017-05-18 LAB — CBC WITH DIFFERENTIAL/PLATELET
Basophils Absolute: 0.1 10*3/uL (ref 0.0–0.1)
Basophils Relative: 0.9 % (ref 0.0–3.0)
EOS PCT: 0.7 % (ref 0.0–5.0)
Eosinophils Absolute: 0.1 10*3/uL (ref 0.0–0.7)
HCT: 41.7 % (ref 36.0–46.0)
Hemoglobin: 13.4 g/dL (ref 12.0–15.0)
LYMPHS ABS: 1.6 10*3/uL (ref 0.7–4.0)
Lymphocytes Relative: 20.4 % (ref 12.0–46.0)
MCHC: 32.2 g/dL (ref 30.0–36.0)
MCV: 91.5 fl (ref 78.0–100.0)
MONO ABS: 0.5 10*3/uL (ref 0.1–1.0)
Monocytes Relative: 6.4 % (ref 3.0–12.0)
NEUTROS ABS: 5.6 10*3/uL (ref 1.4–7.7)
NEUTROS PCT: 71.6 % (ref 43.0–77.0)
PLATELETS: 331 10*3/uL (ref 150.0–400.0)
RBC: 4.55 Mil/uL (ref 3.87–5.11)
RDW: 13.9 % (ref 11.5–15.5)
WBC: 7.8 10*3/uL (ref 4.0–10.5)

## 2017-05-22 ENCOUNTER — Telehealth: Payer: Self-pay | Admitting: Internal Medicine

## 2017-05-23 DIAGNOSIS — M4316 Spondylolisthesis, lumbar region: Secondary | ICD-10-CM | POA: Diagnosis not present

## 2017-05-23 DIAGNOSIS — M5125 Other intervertebral disc displacement, thoracolumbar region: Secondary | ICD-10-CM | POA: Diagnosis not present

## 2017-05-23 DIAGNOSIS — M5136 Other intervertebral disc degeneration, lumbar region: Secondary | ICD-10-CM | POA: Diagnosis not present

## 2017-05-23 DIAGNOSIS — M4120 Other idiopathic scoliosis, site unspecified: Secondary | ICD-10-CM | POA: Diagnosis not present

## 2017-05-23 DIAGNOSIS — M47816 Spondylosis without myelopathy or radiculopathy, lumbar region: Secondary | ICD-10-CM | POA: Diagnosis not present

## 2017-05-23 DIAGNOSIS — M47817 Spondylosis without myelopathy or radiculopathy, lumbosacral region: Secondary | ICD-10-CM | POA: Diagnosis not present

## 2017-05-23 NOTE — Telephone Encounter (Signed)
Routing to Tammy Scott. 

## 2017-05-23 NOTE — Telephone Encounter (Signed)
Called [employer] and the rep. Said she could check the p/a on file and they will contact us tomorrow to let us know.

## 2017-05-25 NOTE — Telephone Encounter (Signed)
I have not heard back from [employer]. I called Our reimbursement manager Madelyn Flavors hopefully she can help. I left a vm message for her. I will wait to hear from her.

## 2017-06-01 NOTE — Telephone Encounter (Signed)
TS can we close this encounter?

## 2017-06-02 ENCOUNTER — Telehealth: Payer: Self-pay | Admitting: Internal Medicine

## 2017-06-02 NOTE — Telephone Encounter (Signed)
Emily/[name]  Got letter froma a PAN foundatino sating that asthma fund grant shows no paid claims activity past 90 days (letter dated 06/02/2017 ), And if we do not submit the claim by 07/01/17 they will not pay it. Please take this letter and do the needful  Dr. Brand Males, M.D., [facility].C.P Pulmonary and Critical Care Medicine Staff Physician, [place] Director - Interstitial Lung Disease  Program  Pulmonary [place] at [place], [place], [address]  Pager: [phone], If no answer or between  15:00h - 7:00h: call [phone] Telephone: [phone]

## 2017-06-05 ENCOUNTER — Encounter: Payer: Self-pay | Admitting: Internal Medicine

## 2017-06-05 ENCOUNTER — Telehealth: Payer: Self-pay | Admitting: Internal Medicine

## 2017-06-05 NOTE — Telephone Encounter (Signed)
Called and spoke with rep at [facility]  She states that the [place] was denied due to lack of clinical evidence that the pt would benefit from med  She states no need for appeal, but need to fax more detailed clinical notes to [employer]  Their fax number is [phone]  There is a 14 day turnaround time on this  Will forward to [employer]

## 2017-06-05 NOTE — Telephone Encounter (Signed)
Per email, pt is only taking hydromet cough syrup.  I've answered patient via email to answer her original question of holding off cinqair.    Also, see below- Victoria [name] was denied. Routing to MR as FYI- please advise.  Thanks.

## 2017-06-05 NOTE — Telephone Encounter (Signed)
Hold off cinqair - yes I agree  But what is she doing for sinus and congestion - antibiotics? Pred bump?  Also , did her fasenra/nucala get approved?  Dr. Brand Males, M.D., [facility].C.P Pulmonary and Critical Care Medicine Staff Physician, [place] Director - Interstitial Lung Disease  Program  Pulmonary [place] at [place], [place], [address]  Pager: [phone], If no answer or between  15:00h - 7:00h: call [phone] Telephone: [phone]

## 2017-06-05 NOTE — Telephone Encounter (Signed)
MR please advise on pt email.  Thanks!   Hello,  I am scheduled for my last Cinqair IV on Tuesday. I am wondering if I should postpone. I have had a cold for 8 days now. It started has head cold, lots of sneezing, scratchy throat; then on to sinus congestion/headaches and now scratchy throat at times with a tickle and coughing. I have been taking all OTC and herbal treatments as well as the Hydromet cough medicine that was prescribed to me back in April by Rexene Edison.   Please advise if I should postpone Cinqair IV until after Christmas. And, this would be my last one that insurance has approved.   Victoria [name]

## 2017-06-06 ENCOUNTER — Inpatient Hospital Stay (HOSPITAL_COMMUNITY): Admission: RE | Admit: 2017-06-06 | Payer: PPO | Source: Ambulatory Visit

## 2017-06-06 NOTE — Telephone Encounter (Signed)
Tate she needs  Take prednisone 40 mg daily x 2 days, then 20mg  daily x 2 days, then 10mg  daily x 2 days, then 5mg  daily x 2 days and stop or go to baseline dose  cephalexin 500mg  three times daily x  5 days  And also please ask Emily/[name] Welchel to look into fasenra denial   Allergies  Allergen Reactions  . Beclomethasone Dipropionate Hives and Other (See Comments)     weight gain  . Budesonide-Formoterol Fumarate Hives  . Mometasone Furo-Formoterol Fum Hives and Other (See Comments)    weight gain  . Sulfonamide Derivatives Hives and Rash  . Pulmicort [Budesonide]     Headaches   . Statins     Myalgias, RLS

## 2017-06-07 NOTE — Telephone Encounter (Signed)
Tammy please advise if there are any updates on this matter. Thanks.

## 2017-06-07 NOTE — Telephone Encounter (Signed)
Spoke with pt, states that she is much improved now and has declined needing any abx/prednisone.  Tammy Scott manages [place] applications in this office- will forward to her to follow up on denial.

## 2017-06-08 NOTE — Telephone Encounter (Signed)
MR please advise. Thanks! 

## 2017-06-09 NOTE — Telephone Encounter (Signed)
[name] typed up an appeal letter, I sent it along with clinical notes. (06/09/17) Nothing further needed at this time.

## 2017-06-19 NOTE — Telephone Encounter (Signed)
Office was closed will have to call back 06/21/17.

## 2017-06-23 NOTE — Telephone Encounter (Signed)
Previous documentation was an error.

## 2017-06-23 NOTE — Telephone Encounter (Signed)
TS did you call to check on this? Please advise.

## 2017-06-23 NOTE — Telephone Encounter (Signed)
[name]'s got to get with [facility] on this one.

## 2017-07-04 ENCOUNTER — Encounter: Payer: Self-pay | Admitting: Family Medicine

## 2017-07-04 ENCOUNTER — Ambulatory Visit (INDEPENDENT_AMBULATORY_CARE_PROVIDER_SITE_OTHER): Payer: PPO | Admitting: Family Medicine

## 2017-07-04 ENCOUNTER — Encounter: Payer: Self-pay | Admitting: Internal Medicine

## 2017-07-04 DIAGNOSIS — J8283 Eosinophilic asthma: Secondary | ICD-10-CM

## 2017-07-04 DIAGNOSIS — E782 Mixed hyperlipidemia: Secondary | ICD-10-CM

## 2017-07-04 DIAGNOSIS — J82 Pulmonary eosinophilia, not elsewhere classified: Secondary | ICD-10-CM

## 2017-07-04 DIAGNOSIS — M4120 Other idiopathic scoliosis, site unspecified: Secondary | ICD-10-CM

## 2017-07-04 DIAGNOSIS — K219 Gastro-esophageal reflux disease without esophagitis: Secondary | ICD-10-CM | POA: Diagnosis not present

## 2017-07-04 DIAGNOSIS — R739 Hyperglycemia, unspecified: Secondary | ICD-10-CM | POA: Diagnosis not present

## 2017-07-04 MED ORDER — VENLAFAXINE HCL 37.5 MG PO TABS: 37.5000 mg | ORAL_TABLET | Freq: Two times a day (BID) | ORAL | 0 refills | Status: DC

## 2017-07-04 NOTE — Assessment & Plan Note (Signed)
Avoid offending foods, start probiotics. Do not eat large meals in late evening and consider raising head of bed.  

## 2017-07-04 NOTE — Assessment & Plan Note (Signed)
Encouraged heart healthy diet, increase exercise, avoid trans fats, consider a krill oil cap daily 

## 2017-07-04 NOTE — Assessment & Plan Note (Addendum)
hgba1c acceptable, minimize simple carbs. Increase exercise as tolerated.  

## 2017-07-04 NOTE — Progress Notes (Signed)
Subjective:  I acted as a Education administrator for Dr. Charlett Blake. [place], [place]  Patient ID: Victoria [name], female    DOB: 1956-05-23, 62 y.o.   MRN: [id number]  No chief complaint on file.   HPI  Patient is in today for a 4 month follow up she is generally doing well.  She is now following with Dr. Amedeo Plenty of orthopedics and [facility] and evaluation has uncovered significant scoliosis.  She is undergoing chiropractic care as well as physical therapy and is hoping over time to see some improvement in her back and hip pain.  She is also on Repatha with cardiology and has seen a good improvement in her cholesterol numbers.  She denies any recent febrile illness or acute hospitalization. Denies CP/palp/SOB/HA/congestion/fevers/GI or GU c/o. Taking meds as prescribed  Patient Care Team: Mosie Lukes, MD as PCP - General (Family Medicine) [place], Jennefer Bravo, MD as Referring Physician (Dermatology) Brand Males, MD as Consulting Physician (Pulmonary Disease) Dorothy Spark, MD as Consulting Physician (Cardiology) Dian Queen, MD as Consulting Physician (Obstetrics and Gynecology) Melissa Montane, MD as Consulting Physician (Otolaryngology) Pyrtle, Lajuan Lines, MD as Consulting Physician (Gastroenterology)   Past Medical History:  Diagnosis Date  . Allergic bronchopulmonary aspergillosis ([place]) 2008   sees Dr Edmund Hilda pulmonology  . Anemia    iron deficiency, resolved  . Anxiety   . Asthma   . CAD (coronary artery disease)    a. LHC 6/16:  oOM1 60, pRCA 25 >> med Rx  . CAP (community acquired pneumonia) 06/07/2016  . COPD (chronic obstructive pulmonary disease) ([place])   . Depression    mild  . Diverticulosis   . GERD (gastroesophageal reflux disease)   . H/O hiatal hernia   . History of echocardiogram    Echo 6/16:  Mod LVH, EF 60-65%, no RWMA, Gr 1 DD, trivial MR, normal LA size.  Marland Kitchen Hyperglycemia 11/20/2015  . Hyperlipidemia   . Hyperlipidemia, mixed 09/11/2007   Qualifier:  Diagnosis of  By: Jerold Coombe   Did not tolerate Lipitor, zocor, Lovastatin, Pravastatin, Livalo, Crestor even low dose   . IBS (irritable bowel syndrome)   . Maxillary sinusitis   . Normal cardiac stress test 11/2011   No evidence of ischemia or infarct. Calculated ejection fraction 72%.  . Obesity   . OSA (obstructive sleep apnea) 02/2012   has stopped using  cpap  . Osteoarthritis   . Osteoporosis   . Pneumonia 11/2011   "before 2013 I hadn't had pneumonia since I was a child" (04/13/2012)  . Pulmonary nodules   . Schatzki's ring     Past Surgical History:  Procedure Laterality Date  . APPENDECTOMY  1989  . CARDIAC CATHETERIZATION N/A 11/25/2014   Procedure: Right/Left Heart Cath and Coronary Angiography;  Surgeon: Belva Crome, MD;  Location: [facility];  Service: Cardiovascular;  Laterality: N/A;  . CESAREAN SECTION  1985  . HERNIA REPAIR  04/13/2012   VHR laparoscopic  . VENTRAL HERNIA REPAIR  04/13/2012   Procedure: LAPAROSCOPIC VENTRAL HERNIA;  Surgeon: Adin Hector, MD;  Location: [facility];  Service: General;  Laterality: N/A;  laparoscopic repair of incarcerated hernia    Family History  Problem Relation Age of Onset  . Breast cancer Mother   . Hypertension Mother   . Diabetes Mother   . Diverticulosis Father   . Prostate cancer Father   . Pulmonary embolism Brother        recurrent  . Heart  attack Maternal Grandfather   . Breast cancer Sister   . Stroke Neg Hx     Social History   Socioeconomic History  . Marital status: Married    Spouse name: Not on file  . Number of children: 1  . Years of education: Not on file  . Highest education level: Not on file  Social Needs  . Financial resource strain: Not on file  . Food insecurity - worry: Not on file  . Food insecurity - inability: Not on file  . Transportation needs - medical: Not on file  . Transportation needs - non-medical: Not on file  Occupational History    Employer: UNEMPLOYED    Tobacco Use  . Smoking status: Never Smoker  . Smokeless tobacco: Never Used  Substance and Sexual Activity  . Alcohol use: Yes    Alcohol/week: 2.4 oz    Types: 2 Glasses of wine, 2 Cans of beer per week    Comment: "  . Drug use: No  . Sexual activity: Yes    Comment: gluten free, lives with husband and son with CP quadriplegia  Other Topics Concern  . Not on file  Social History Narrative   Cares for a 91yo son with cerebral palsy.     Outpatient Medications Prior to Visit  Medication Sig Dispense Refill  . ALPRAZolam (XANAX) 0.5 MG tablet Take 1 tablet (0.5 mg total) by mouth at bedtime as needed for anxiety (Palpatations, SOB). 30 tablet 0  . aspirin 81 MG tablet Take 81 mg by mouth daily.     . chlorpheniramine-HYDROcodone (TUSSIONEX PENNKINETIC ER) 10-8 MG/5ML SUER Take 5 mLs by mouth.    . Cholecalciferol (VITAMIN D) 2000 UNITS CAPS Take 2,000 Units by mouth daily.     . cyclobenzaprine (FLEXERIL) 10 MG tablet Take 1 tablet (10 mg total) by mouth 3 (three) times daily as needed for muscle spasms. 30 tablet 0  . Evolocumab (REPATHA SURECLICK) 426 MG/ML SOAJ Inject 1 pen into the skin every 14 (fourteen) days. 2 pen 11  . fexofenadine (ALLEGRA) 180 MG tablet Take 180 mg by mouth daily as needed for allergies.     . fluticasone (FLONASE) 50 MCG/ACT nasal spray Place 2 sprays into both nostrils daily. 16 g 1  . guaiFENesin (MUCINEX) 600 MG 12 hr tablet Take 2 tablets (1,200 mg total) by mouth 2 (two) times daily as needed. 30 tablet 1  . HYDROcodone-homatropine (HYDROMET) 5-1.5 MG/5ML syrup Take 5 mLs by mouth every 6 (six) hours as needed for cough. 240 mL 0  . ibuprofen (ADVIL,MOTRIN) 200 MG tablet Take 200 mg by mouth every 6 (six) hours as needed for mild pain or moderate pain.    Marland Kitchen ipratropium-albuterol (DUONEB) 0.5-2.5 (3) MG/3ML SOLN INHALE THE CONTENTS OF 1 VIAL VIA NEBULIZER UP TO 4 TO 6 TIMES A DAY. 360 mL 2  . meloxicam (MOBIC) 7.5 MG tablet Take 1-2 tablets (7.5-15 mg  total) by mouth daily. 60 tablet 3  . mometasone (ASMANEX 120 METERED DOSES) 220 MCG/INH inhaler Inhale 2 puffs into the lungs 2 (two) times daily. 1 Inhaler 12  . montelukast (SINGULAIR) 10 MG tablet TAKE 1 TABLET BY MOUTH ONCE DAILY 90 tablet 5  . nitroGLYCERIN (NITROSTAT) 0.4 MG SL tablet Place 1 tablet (0.4 mg total) under the tongue every 5 (five) minutes as needed for chest pain. 25 tablet 11  . Omeprazole-Sodium Bicarbonate (ZEGERID OTC PO) Take 1 tablet by mouth daily.    Marland Kitchen OVER THE COUNTER MEDICATION  Take 1 capsule by mouth daily. Tumeric    . reslizumab (CINQAIR) 100 MG/10ML SOLN injection Inject into the vein every 30 (thirty) days.    Marland Kitchen tiotropium (SPIRIVA HANDIHALER) 18 MCG inhalation capsule INHALE THE CONTENTS OF ONE CAPSULE DAILY 90 capsule 3  . venlafaxine XR (EFFEXOR-XR) 75 MG 24 hr capsule TAKE 1 CAPSULE BY MOUTH ONCE DAILY ALONG WITH 37.5 MG CAPSULE DAILY. (112.5 MG TOTAL DAILY) 180 capsule 1  . venlafaxine (EFFEXOR) 37.5 MG tablet Take 37.5 mg by mouth 2 (two) times daily.     No facility-administered medications prior to visit.     Allergies  Allergen Reactions  . Beclomethasone Dipropionate Hives and Other (See Comments)     weight gain  . Budesonide-Formoterol Fumarate Hives  . Mometasone Furo-Formoterol Fum Hives and Other (See Comments)    weight gain  . Sulfonamide Derivatives Hives and Rash  . Pulmicort [Budesonide]     Headaches   . Statins     Myalgias, RLS    Review of Systems  Constitutional: Negative for fever and malaise/fatigue.  HENT: Negative for congestion.   Eyes: Negative for blurred vision.  Respiratory: Positive for cough. Negative for shortness of breath.   Cardiovascular: Negative for chest pain, palpitations and leg swelling.  Gastrointestinal: Negative for vomiting.  Musculoskeletal: Positive for back pain and joint pain.  Skin: Negative for rash.  Neurological: Negative for loss of consciousness and headaches.       Objective:      Physical Exam  Constitutional: She is oriented to person, place, and time. She appears well-developed and well-nourished. No distress.  HENT:  Head: Normocephalic and atraumatic.  Eyes: Conjunctivae are normal.  Neck: Normal range of motion. No thyromegaly present.  Cardiovascular: Normal rate and regular rhythm.  Pulmonary/Chest: Effort normal and breath sounds normal. She has no wheezes.  RLL rhonchi  Abdominal: Soft. Bowel sounds are normal. There is no tenderness.  Musculoskeletal: Normal range of motion. She exhibits no edema or deformity.  Neurological: She is alert and oriented to person, place, and time.  Skin: Skin is warm and dry. She is not diaphoretic.  Psychiatric: She has a normal mood and affect.    BP 122/76 (BP Location: Left Arm, Patient Position: Sitting, Cuff Size: Normal)   Pulse 79   Temp 98.3 F (36.8 C) (Oral)   Resp 18   Wt 160 lb 9.6 oz (72.8 kg)   SpO2 98%   BMI 29.37 kg/m  Wt Readings from Last 3 Encounters:  07/04/17 160 lb 9.6 oz (72.8 kg)  05/03/17 160 lb (72.6 kg)  04/21/17 163 lb (73.9 kg)   BP Readings from Last 3 Encounters:  07/04/17 122/76  05/03/17 (!) 107/54  04/21/17 114/76     Immunization History  Administered Date(s) Administered  . Influenza Split 04/20/2011  . Influenza Whole 06/06/2007, 04/15/2008, 04/02/2009, 03/29/2012  . Influenza, High Dose Seasonal PF 05/05/2015  . Influenza,inj,Quad PF,6+ Mos 05/09/2013, 03/03/2014, 05/03/2016  . Pneumococcal Conjugate-13 05/09/2013  . Pneumococcal Polysaccharide-23 05/04/2005  . Td 07/29/2009  . Tdap 03/11/2015    Health Maintenance  Topic Date Due  . MAMMOGRAM  05/07/2007  . PAP SMEAR  12/03/2012  . INFLUENZA VACCINE  01/18/2017  . COLONOSCOPY  08/12/2020  . TETANUS/TDAP  03/10/2025  . Hepatitis C Screening  Completed  . HIV Screening  Completed    Lab Results  Component Value Date   WBC 7.8 05/18/2017   HGB 13.4 05/18/2017   HCT 41.7 05/18/2017  PLT 331.0  05/18/2017   GLUCOSE 95 04/12/2017   CHOL 130 04/12/2017   TRIG 111.0 04/12/2017   HDL 55.20 04/12/2017   LDLDIRECT 177.8 09/07/2011   LDLCALC 53 04/12/2017   ALT 15 04/12/2017   AST 15 04/12/2017   NA 142 04/12/2017   K 4.7 04/12/2017   CL 106 04/12/2017   CREATININE 0.63 04/12/2017   BUN 15 04/12/2017   CO2 30 04/12/2017   TSH 3.16 04/12/2017   INR 1.0 11/19/2014   HGBA1C 5.7 04/12/2017    Lab Results  Component Value Date   TSH 3.16 04/12/2017   Lab Results  Component Value Date   WBC 7.8 05/18/2017   HGB 13.4 05/18/2017   HCT 41.7 05/18/2017   MCV 91.5 05/18/2017   PLT 331.0 05/18/2017   Lab Results  Component Value Date   NA 142 04/12/2017   K 4.7 04/12/2017   CO2 30 04/12/2017   GLUCOSE 95 04/12/2017   BUN 15 04/12/2017   CREATININE 0.63 04/12/2017   BILITOT 0.5 04/12/2017   ALKPHOS 76 04/12/2017   AST 15 04/12/2017   ALT 15 04/12/2017   PROT 6.2 04/12/2017   ALBUMIN 3.9 04/12/2017   CALCIUM 9.2 04/12/2017   ANIONGAP 7 06/10/2016   GFR 102.47 04/12/2017   Lab Results  Component Value Date   CHOL 130 04/12/2017   Lab Results  Component Value Date   HDL 55.20 04/12/2017   Lab Results  Component Value Date   LDLCALC 53 04/12/2017   Lab Results  Component Value Date   TRIG 111.0 04/12/2017   Lab Results  Component Value Date   CHOLHDL 2 04/12/2017   Lab Results  Component Value Date   HGBA1C 5.7 04/12/2017         Assessment & Plan:   Problem List Items Addressed This Visit    Hyperglycemia (Chronic)    hgba1c acceptable, minimize simple carbs. Increase exercise as tolerated.       Hyperlipidemia, mixed    Encouraged heart healthy diet, increase exercise, avoid trans fats, consider a krill oil cap daily      Esophageal reflux    Avoid offending foods, start probiotics. Do not eat large meals in late evening and consider raising head of bed.       SCOLIOSIS    Is following with Dr Rowe Pavy and she is trying to straighten  out with massage therapy and [facility] to realign.      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[place])    Is going to change to [employer] in the doctors office. She did well with [employer] but had trouble with payment so is switchin to the [place]. Feels mostly.         I have changed [facility] venlafaxine. I am also having her maintain her Vitamin D, aspirin, fexofenadine, ibuprofen, Omeprazole-Sodium Bicarbonate (ZEGERID OTC PO), fluticasone, ipratropium-albuterol, OVER THE COUNTER MEDICATION, cyclobenzaprine, guaiFENesin, HYDROcodone-homatropine, Evolocumab, reslizumab, nitroGLYCERIN, chlorpheniramine-HYDROcodone, montelukast, tiotropium, mometasone, ALPRAZolam, meloxicam, and venlafaxine XR.  Meds ordered this encounter  Medications  . venlafaxine (EFFEXOR) 37.5 MG tablet    Sig: Take 1 tablet (37.5 mg total) by mouth 2 (two) times daily.    Dispense:  60 tablet    Refill:  0    CMA served as scribe during this visit. History, Physical and Plan performed by medical provider. Documentation and orders reviewed and attested to.  [name], MD

## 2017-07-04 NOTE — Assessment & Plan Note (Signed)
Is going to change to [place] in the doctors office. She did well with [employer] but had trouble with payment so is switchin to the [place]. Feels mostly.

## 2017-07-04 NOTE — Patient Instructions (Signed)
Shingrix is the new shingles shot. 2 shots over 2-6 months call insurance and confirm payment then return for shot   Food Choices for Gastroesophageal Reflux Disease, Adult When you have gastroesophageal reflux disease (GERD), the foods you eat and your eating habits are very important. Choosing the right foods can help ease your discomfort. What guidelines do I need to follow?  Choose fruits, vegetables, whole grains, and low-fat dairy products.  Choose low-fat meat, fish, and poultry.  Limit fats such as oils, salad dressings, butter, nuts, and avocado.  Keep a food diary. This helps you identify foods that cause symptoms.  Avoid foods that cause symptoms. These may be different for everyone.  Eat small meals often instead of 3 large meals a day.  Eat your meals slowly, in a place where you are relaxed.  Limit fried foods.  Cook foods using methods other than frying.  Avoid drinking alcohol.  Avoid drinking large amounts of liquids with your meals.  Avoid bending over or lying down until 2-3 hours after eating. What foods are not recommended? These are some foods and drinks that may make your symptoms worse: Vegetables Tomatoes. Tomato juice. Tomato and spaghetti sauce. Chili peppers. Onion and garlic. Horseradish. Fruits Oranges, grapefruit, and lemon (fruit and juice). Meats High-fat meats, fish, and poultry. This includes hot dogs, ribs, ham, sausage, salami, and bacon. Dairy Whole milk and chocolate milk. Sour cream. Cream. Butter. Ice cream. Cream cheese. Drinks Coffee and tea. Bubbly (carbonated) drinks or energy drinks. Condiments Hot sauce. Barbecue sauce. Sweets/Desserts Chocolate and cocoa. Donuts. Peppermint and spearmint. Fats and Oils High-fat foods. This includes [place] fries and potato chips. Other Vinegar. Strong spices. This includes black pepper, white pepper, red pepper, cayenne, curry powder, cloves, ginger, and chili powder. The items listed  above may not be a complete list of foods and drinks to avoid. Contact your dietitian for more information. This information is not intended to replace advice given to you by your health care provider. Make sure you discuss any questions you have with your health care provider. Document Released: 12/06/2011 Document Revised: 11/12/2015 Document Reviewed: 04/10/2013 Elsevier Interactive Patient Education  2017 [employer].

## 2017-07-04 NOTE — Assessment & Plan Note (Signed)
Is following with Dr Rowe Pavy and she is trying to straighten out with massage therapy and [facility] PT to realign.

## 2017-07-05 NOTE — Telephone Encounter (Signed)
[name]? Please advise, thanks!

## 2017-07-07 ENCOUNTER — Telehealth: Payer: Self-pay | Admitting: Internal Medicine

## 2017-07-07 NOTE — Telephone Encounter (Signed)
Patient is calling to check on status of Victoria [name], CB is [phone].

## 2017-07-07 NOTE — Telephone Encounter (Signed)
This regarding Fasenra denial.   Routing to [employer] for follow-up.

## 2017-07-11 ENCOUNTER — Encounter: Payer: Self-pay | Admitting: Family Medicine

## 2017-07-12 ENCOUNTER — Other Ambulatory Visit: Payer: Self-pay | Admitting: Family Medicine

## 2017-07-12 DIAGNOSIS — F329 Major depressive disorder, single episode, unspecified: Secondary | ICD-10-CM

## 2017-07-12 DIAGNOSIS — F419 Anxiety disorder, unspecified: Principal | ICD-10-CM

## 2017-07-12 MED ORDER — VENLAFAXINE HCL 37.5 MG PO TABS: 37.5000 mg | ORAL_TABLET | Freq: Two times a day (BID) | ORAL | 5 refills | Status: DC

## 2017-07-12 NOTE — Telephone Encounter (Signed)
Faxed denial 07/07/17, it failed.I faxed it again 07/10/17. (forgot to document.) Faxed to [facility]'s intake team. Rep. I talked to said she couldn't see her in the system. I tried to call [facility] back since I haven't heard from them. I was going to be on hold for 3 minutes so I left a call back #. Waiting for [facility] to cb. I called [facility] back, said they just got the referral 2 days ago. It could take up to 10 days. (07/21/17, I'm off that day.) Will e-mail pt. To let her know. Nothing further needed at this time. Closing.

## 2017-07-12 NOTE — Telephone Encounter (Signed)
Tammy please advise on update. Thanks. 

## 2017-07-13 ENCOUNTER — Telehealth: Payer: Self-pay | Admitting: Internal Medicine

## 2017-07-13 NOTE — Telephone Encounter (Signed)
I e-mailed pt. Thru My Chart. I just spoke with her and let her know I called our rembursment rep., to see if she can get the referral pushed thru. I explained to her she said she could but it would take 24 hrs. I told Victoria [name] I would e-mail her as soon as I heard from her. Pt. Is going out of town she'll look at it when she comes back on Sunday. Waiting to hear from [place].(rembrusment rep)

## 2017-07-14 DIAGNOSIS — M4125 Other idiopathic scoliosis, thoracolumbar region: Secondary | ICD-10-CM | POA: Diagnosis not present

## 2017-07-17 NOTE — Telephone Encounter (Signed)
Made pt. Aware through my chart 07/14/17 at 5:08 that I didn't hear from [facility] nor did [facility] fax anything. I will wait to hear back from [place] this afternoon. Then I will e-mail or call pt to make her aware.

## 2017-07-17 NOTE — Telephone Encounter (Signed)
Barnett Applebaum calling about this pt's Victoria [name]. CB is [phone]. Per Barnett Applebaum, [facility] has not responded and they are taking this to the [facility] team and should have an answer by this afternoon.

## 2017-07-18 NOTE — Telephone Encounter (Signed)
Will forward to TS as she is handling these now.

## 2017-07-19 ENCOUNTER — Telehealth: Payer: Self-pay | Admitting: Family Medicine

## 2017-07-19 ENCOUNTER — Telehealth: Payer: Self-pay | Admitting: Internal Medicine

## 2017-07-19 DIAGNOSIS — F419 Anxiety disorder, unspecified: Principal | ICD-10-CM

## 2017-07-19 DIAGNOSIS — F329 Major depressive disorder, single episode, unspecified: Secondary | ICD-10-CM

## 2017-07-19 NOTE — Telephone Encounter (Signed)
Copied from [place] [phone]. Topic: Quick Communication - Rx Refill/Question >> Jul 19, 2017  2:41 PM Wynetta Emery, [place] C wrote: Medication:  pt says that Rx  venlafaxine (EFFEXOR) 37.5 MG tablet was sent in. - pt says that; that is incorrect. Pt says that this is the second time Rx had an error. . Pt would like to have the extended relief (XR) Capsules sent in : so Rx should be: venlafaxine XR (EFFEXOR-XR) 37.5 MG 24 hr capsule instead. She would like for provider to correct and send in to pharmacy.      Preferred Pharmacy (with phone number or street name): [place], [place]   Please assist further.    Agent: Please be advised that RX refills may take up to 3 business days. We ask that you follow-up with your pharmacy.

## 2017-07-19 NOTE — Telephone Encounter (Signed)
Called [facility], spoke to [place] U letting her know that the pt is a current pt at our office.  Crystal stated she spoke with Rebeca Alert and told her she was refaxing the paperwork to our office.  Crystal stated they had the wrong fax number for [name] but put the correct fax number in their system.

## 2017-07-20 MED ORDER — VENLAFAXINE HCL ER 75 MG PO CP24: ORAL_CAPSULE | ORAL | 1 refills | Status: DC

## 2017-07-20 NOTE — Telephone Encounter (Signed)
Correct medication sent in

## 2017-07-24 DIAGNOSIS — M4125 Other idiopathic scoliosis, thoracolumbar region: Secondary | ICD-10-CM | POA: Diagnosis not present

## 2017-07-24 NOTE — Telephone Encounter (Signed)
Received P/A form from [facility] 07/19/17. I faxed it the same day. (forgot to document) [facility] to make sure they got the fax 07/24/17, it's been approved. Auth.#: [id number], Dates: 07/19/17 thru 06/19/18.  After 4 phone calls I got the right pharm ([facility]) rep. Took pt's info then transferred me to a pharmacist. I got as far a the rx, then I put the ph on mute while I looked up dx and allergies and hit cancel instead of mute. I will have to call back tomorrow as it is 5:31.

## 2017-07-27 ENCOUNTER — Telehealth: Payer: Self-pay | Admitting: Internal Medicine

## 2017-07-27 ENCOUNTER — Telehealth: Payer: Self-pay | Admitting: Pharmacist

## 2017-07-27 ENCOUNTER — Ambulatory Visit (INDEPENDENT_AMBULATORY_CARE_PROVIDER_SITE_OTHER): Payer: PPO

## 2017-07-27 DIAGNOSIS — J82 Pulmonary eosinophilia, not elsewhere classified: Secondary | ICD-10-CM | POA: Diagnosis not present

## 2017-07-27 DIAGNOSIS — J4540 Moderate persistent asthma, uncomplicated: Secondary | ICD-10-CM

## 2017-07-27 DIAGNOSIS — J8289 Other pulmonary eosinophilia, not elsewhere classified: Secondary | ICD-10-CM

## 2017-07-27 NOTE — Telephone Encounter (Signed)
Routing to TS for follow up.

## 2017-07-27 NOTE — Telephone Encounter (Signed)
Patient has been approved for financial assistance for Repatha for the 2019 year. Pt is aware and will reach out to the [place] to begin her shipments again.

## 2017-07-28 MED ORDER — BENRALIZUMAB 30 MG/ML ~~LOC~~ SOSY
30.0000 mg | PREFILLED_SYRINGE | Freq: Once | SUBCUTANEOUS | Status: AC
  Administered 2017-07-27: 30 mg via SUBCUTANEOUS

## 2017-07-28 NOTE — Progress Notes (Signed)
Documentation of medication administration and charges of Fasenra have been completed by Lindsay Lemons, CMA based on the Fasenra documentation sheet completed by Tammy Scott.   

## 2017-08-01 DIAGNOSIS — R262 Difficulty in walking, not elsewhere classified: Secondary | ICD-10-CM | POA: Diagnosis not present

## 2017-08-01 DIAGNOSIS — M545 Low back pain: Secondary | ICD-10-CM | POA: Diagnosis not present

## 2017-08-01 DIAGNOSIS — M4125 Other idiopathic scoliosis, thoracolumbar region: Secondary | ICD-10-CM | POA: Diagnosis not present

## 2017-08-02 NOTE — Telephone Encounter (Signed)
Tammy please advise on the status of this. Thanks.

## 2017-08-02 NOTE — Telephone Encounter (Signed)
Please refer to 07/27/2017 ph. Note. ( So I'm not being repeative.)

## 2017-08-02 NOTE — Telephone Encounter (Addendum)
Called rx in and I also gave the pharmacist pt's dx and drug allergies. [facility] just called they will be delivering pt's fasenra 08/08/17. Called pt. To make her aware. [facility] had already called her and told her they would be calling us to set up del.. Nothing further needed.

## 2017-08-08 ENCOUNTER — Telehealth: Payer: Self-pay | Admitting: Internal Medicine

## 2017-08-08 NOTE — Telephone Encounter (Addendum)
fasenra PFS:1 Arrival Date: 08/08/17 Lot#: [id number] Exp.:09/2018

## 2017-08-09 ENCOUNTER — Telehealth: Payer: Self-pay | Admitting: Internal Medicine

## 2017-08-10 ENCOUNTER — Ambulatory Visit: Payer: PPO | Admitting: Medical

## 2017-08-10 DIAGNOSIS — Z0289 Encounter for other administrative examinations: Secondary | ICD-10-CM

## 2017-08-11 NOTE — Telephone Encounter (Signed)
Routing this to [employer] for her to follow up on

## 2017-08-17 DIAGNOSIS — M4125 Other idiopathic scoliosis, thoracolumbar region: Secondary | ICD-10-CM | POA: Diagnosis not present

## 2017-08-17 DIAGNOSIS — M4316 Spondylolisthesis, lumbar region: Secondary | ICD-10-CM | POA: Diagnosis not present

## 2017-08-17 DIAGNOSIS — M545 Low back pain: Secondary | ICD-10-CM | POA: Diagnosis not present

## 2017-08-17 DIAGNOSIS — R262 Difficulty in walking, not elsewhere classified: Secondary | ICD-10-CM | POA: Diagnosis not present

## 2017-08-17 NOTE — Telephone Encounter (Signed)
Tammy please advise on pt's Fasenra. Thanks.

## 2017-08-18 ENCOUNTER — Telehealth: Payer: Self-pay | Admitting: Cardiology

## 2017-08-18 NOTE — Telephone Encounter (Signed)
Pt calling in with complaints of high BP, palpitations, and chest tightness, for over a month. Pt has known CAD and is on Repatha.  Pt states that her BP has been as high as 160/90, and usually runs in the low 517'O systolic.  Pts BP today was 130/80 and HR- 85.  Pt states that symptoms have been on and off x 1 month.  Pt states it really became noticeable this past Wednesday. Pt states that on Wed, she had some palpitations and chest tightness, and had to utilize taking Nitro x 2.  Pt states that symptoms were relieved with 2 nitro on board.   Pt describes her chest tightness mid-sternal in nature.  Pt states she has NO sob, doe, N/V, diaphoresis, radiating pain, dizziness, blurred vision, pre-syncopal or syncopal episodes.  Pt states she feels fine today, and is in no acute distress.  Pt states she doesn't have an appt with Dr Meda Coffee until June, but was wanting to see if she could be seen on Monday 08/21/17 by an Extender.  Scheduled the pt to see Vin Bhagat PA-C for next Monday 3/4 at 0900.  Pt aware to arrive 15 mins prior to this appt.  Advised the pt that if her symptoms worsen over the weekend, then she needs to refer to the ER or call EMS.  Advised the pt to continue monitoring her BP and HR, stay hydrated, and maintain a low sodium diet.  Advised the pt to use nitro as needed, and educated her on how to use this.  Informed the pt that Dr Meda Coffee is currently out of the office at this time, but I will route this message and plan to her for further review. Pt verbalized understanding and agrees with this plan.  Pt gracious for all the assistance provided.

## 2017-08-18 NOTE — H&P (View-Only) (Signed)
Cardiology Office Note    Date:  08/21/2017   ID:  Breyana Follansbee, DOB 30-Mar-1956, MRN [id number]  PCP:  Mosie Lukes, MD  Cardiologist:  Dr. Meda Coffee  Chief Complaint: Chest tightness/palpitations  Pulmonary: [name]   History of Present Illness:   Victoria [name] is a 62 y.o. female with a hx of severe persistent asthma, HL on Repatha, non obstructive CAD and , obesity added to my schedule for chest tightness/palpitations and elevated BP.  In 2016  she  complaints of severe fatigue and worse DOE for the last 6 months.  She had seen her pulmonologist several times and also had been to the emergency room several times. She complained of decreased exercise tolerance with elevated heart rate and blood pressure, palpitations with any type of activity. She c/o chest pain frequently. She noted relief with NTG in the past.  We set her up for LHC and an Echo.  Cardiac cath 11/2014 demonstrated mod non-obstructive CAD with ostial OM1 60%. This was not felt to be severe enough to proceed with PCI.  Vasospasm could not be excluded.  PRN Nitro was recommended.    She was doing well on cardiac stand point when last seen by Dr. Meda Coffee 08/2016. She required multiple rounds of steroids due to asthma exacerbation.   The patient presented today for evaluation of chest pain. She describes her pain as sharp/pressue at substernal area. intermittent without any associated symptoms. She took SL nitro on two separate occasion with improvement symptoms. She denies orthopnea, PND, syncope or melena. Has LE edema. Recently noted elevated bp on R side of arm intermittently.   Past Medical History:  Diagnosis Date  . Allergic bronchopulmonary aspergillosis ([place]) 2008   sees Dr Edmund Hilda pulmonology  . Anemia    iron deficiency, resolved  . Anxiety   . Asthma   . CAD (coronary artery disease)    a. LHC 6/16:  oOM1 60, pRCA 25 >> med Rx  . CAP (community acquired pneumonia) 06/07/2016  . COPD (chronic  obstructive pulmonary disease) ([place])   . Depression    mild  . Diverticulosis   . GERD (gastroesophageal reflux disease)   . H/O hiatal hernia   . History of echocardiogram    Echo 6/16:  Mod LVH, EF 60-65%, no RWMA, Gr 1 DD, trivial MR, normal LA size.  Marland Kitchen Hyperglycemia 11/20/2015  . Hyperlipidemia   . Hyperlipidemia, mixed 09/11/2007   Qualifier: Diagnosis of  By: Jerold Coombe   Did not tolerate Lipitor, zocor, Lovastatin, Pravastatin, Livalo, Crestor even low dose   . IBS (irritable bowel syndrome)   . Maxillary sinusitis   . Normal cardiac stress test 11/2011   No evidence of ischemia or infarct. Calculated ejection fraction 72%.  . Obesity   . OSA (obstructive sleep apnea) 02/2012   has stopped using  cpap  . Osteoarthritis   . Osteoporosis   . Pneumonia 11/2011   "before 2013 I hadn't had pneumonia since I was a child" (04/13/2012)  . Pulmonary nodules   . Schatzki's ring     Past Surgical History:  Procedure Laterality Date  . APPENDECTOMY  1989  . CARDIAC CATHETERIZATION N/A 11/25/2014   Procedure: Right/Left Heart Cath and Coronary Angiography;  Surgeon: Belva Crome, MD;  Location: [facility];  Service: Cardiovascular;  Laterality: N/A;  . CESAREAN SECTION  1985  . HERNIA REPAIR  04/13/2012   VHR laparoscopic  . VENTRAL HERNIA REPAIR  04/13/2012  Procedure: LAPAROSCOPIC VENTRAL HERNIA;  Surgeon: Adin Hector, MD;  Location: [facility];  Service: General;  Laterality: N/A;  laparoscopic repair of incarcerated hernia    Current Medications: Prior to Admission medications   Medication Sig Start Date End Date Taking? Authorizing Provider  ALPRAZolam Duanne Moron) 0.5 MG tablet Take 1 tablet (0.5 mg total) by mouth at bedtime as needed for anxiety (Palpatations, SOB). 01/16/17  Yes Opalski, Deborah, DO  aspirin 81 MG tablet Take 81 mg by mouth daily.    Yes [provider]  chlorpheniramine-HYDROcodone (TUSSIONEX PENNKINETIC ER) 10-8 MG/5ML SUER Take 5 mLs by  mouth.   Yes [provider]  Cholecalciferol (VITAMIN D) 2000 UNITS CAPS Take 2,000 Units by mouth daily.    Yes [provider]  cyclobenzaprine (FLEXERIL) 10 MG tablet Take 1 tablet (10 mg total) by mouth 3 (three) times daily as needed for muscle spasms. 05/03/16  Yes Opalski, Deborah, DO  Evolocumab (REPATHA SURECLICK) 423 MG/ML SOAJ Inject 1 pen into the skin every 14 (fourteen) days. 08/12/16  Yes Dorothy Spark, MD  FASENRA 30 MG/ML SOSY Inject 1 Syringe into the muscle every 30 (thirty) days. 08/04/17  Yes [provider]  fexofenadine (ALLEGRA) 180 MG tablet Take 180 mg by mouth daily as needed for allergies.    Yes [provider]  guaiFENesin (MUCINEX) 600 MG 12 hr tablet Take 2 tablets (1,200 mg total) by mouth 2 (two) times daily as needed. 06/10/16  Yes Rizwan, Eunice Blase, MD  ipratropium-albuterol (DUONEB) 0.5-2.5 (3) MG/3ML SOLN INHALE THE CONTENTS OF 1 VIAL VIA NEBULIZER UP TO 4 TO 6 TIMES A DAY. 11/24/15  Yes Brand Males, MD  meloxicam (MOBIC) 7.5 MG tablet Take 1-2 tablets (7.5-15 mg total) by mouth daily. 03/02/17  Yes Mosie Lukes, MD  mometasone (ASMANEX 120 METERED DOSES) 220 MCG/INH inhaler Inhale 2 puffs into the lungs 2 (two) times daily. 01/16/17  Yes Brand Males, MD  montelukast (SINGULAIR) 10 MG tablet TAKE 1 TABLET BY MOUTH ONCE DAILY 12/19/16  Yes Brand Males, MD  nitroGLYCERIN (NITROSTAT) 0.4 MG SL tablet Place 1 tablet (0.4 mg total) under the tongue every 5 (five) minutes as needed for chest pain. 08/26/16  Yes Dorothy Spark, MD  Omeprazole-Sodium Bicarbonate (ZEGERID OTC PO) Take 1 tablet by mouth daily.   Yes [provider]  OVER THE COUNTER MEDICATION Take 1 capsule by mouth daily. Tumeric   Yes [provider]  tiotropium (SPIRIVA HANDIHALER) 18 MCG inhalation capsule INHALE THE CONTENTS OF ONE CAPSULE DAILY 01/16/17  Yes Brand Males, MD  venlafaxine XR (EFFEXOR-XR) 75 MG 24 hr capsule  TAKE 1 CAPSULE BY MOUTH ONCE DAILY ALONG WITH 37.5 MG CAPSULE DAILY. (112.5 MG TOTAL DAILY) 07/20/17  Yes Mosie Lukes, MD     Allergies:   Beclomethasone dipropionate; Budesonide-formoterol fumarate; Mometasone furo-formoterol fum; Sulfonamide derivatives; Pulmicort [budesonide]; and Statins   Social History   Socioeconomic History  . Marital status: Married    Spouse name: None  . Number of children: 1  . Years of education: None  . Highest education level: None  Social Needs  . Financial resource strain: None  . Food insecurity - worry: None  . Food insecurity - inability: None  . Transportation needs - medical: None  . Transportation needs - non-medical: None  Occupational History    Employer: UNEMPLOYED  Tobacco Use  . Smoking status: Never Smoker  . Smokeless tobacco: Never Used  Substance and Sexual Activity  . Alcohol  use: Yes    Alcohol/week: 2.4 oz    Types: 2 Glasses of wine, 2 Cans of beer per week    Comment: "  . Drug use: No  . Sexual activity: Yes    Comment: gluten free, lives with husband and son with CP quadriplegia  Other Topics Concern  . None  Social History Narrative   Cares for a 37yo son with cerebral palsy.      Family History:  The patient's family history includes Breast cancer in her mother and sister; Diabetes in her mother; Diverticulosis in her father; Heart attack in her maternal grandfather; Hypertension in her mother; Prostate cancer in her father; Pulmonary embolism in her brother.   ROS:   Please see the history of present illness.    ROS All other systems reviewed and are negative.   PHYSICAL EXAM:   VS:  BP 110/88   Pulse 69    GEN: Well nourished, well developed, in no acute distress  HEENT: normal  Neck: no JVD, carotid bruits, or masses Cardiac: RRR; no murmurs, rubs, or gallops,1 + BL LE edema. R radial pulse is lightly weaker than left  Respiratory:  clear to auscultation bilaterally, normal work of breathing GI:  soft, nontender, nondistended, + BS MS: no deformity or atrophy  Skin: warm and dry, no rash Neuro:  Alert and Oriented x 3, Strength and sensation are intact Psych: euthymic mood, full affect  Wt Readings from Last 3 Encounters:  07/04/17 160 lb 9.6 oz (72.8 kg)  05/03/17 160 lb (72.6 kg)  04/21/17 163 lb (73.9 kg)      Studies/Labs Reviewed:   EKG:  EKG is ordered today.  The ekg ordered today demonstrates  NSR  Recent Labs: 04/12/2017: ALT 15; BUN 15; Creatinine, Ser 0.63; Potassium 4.7; Sodium 142; TSH 3.16 05/18/2017: Hemoglobin 13.4; Platelets 331.0   Lipid Panel    Component Value Date/Time   CHOL 130 04/12/2017 0805   CHOL 227 (H) 07/25/2016 0822   TRIG 111.0 04/12/2017 0805   HDL 55.20 04/12/2017 0805   HDL 51 07/25/2016 0822   CHOLHDL 2 04/12/2017 0805   VLDL 22.2 04/12/2017 0805   LDLCALC 53 04/12/2017 0805   LDLCALC 160 (H) 07/25/2016 0822   LDLDIRECT 177.8 09/07/2011 0945    Additional studies/ records that were reviewed today include:   Echocardiogram: 08/28/14 Study Conclusions  - Left ventricle: The cavity size was normal. There was moderate   concentric hypertrophy. Systolic function was normal. The   estimated ejection fraction was in the range of 60% to 65%. Wall   motion was normal; there were no regional wall motion   abnormalities. Doppler parameters are consistent with abnormal   left ventricular relaxation (grade 1 diastolic dysfunction). The   E/e&' ratio is between 8-15, suggesting indeterminate LV filling   pressure. - Mitral valve: There was trivial regurgitation. - Left atrium: The atrium was normal in size.  Impressions:  - LVEF 60-65%, modearate LVH, diastolic dysfunction, indeterminate   LV filling pressure, normal LA size.    Right/Left Heart Cath and Coronary Angiography 11/25/14  Conclusion   1. Ost 1st Mrg to 1st Mrg lesion, 60% stenosed. 2. 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 There is ostial/proximal 25-30% narrowing but not the classic slitlike appearance.  60-70% stenosis in the ostium of the  first obtuse marginal.  Otherwise normal coronary arteries.  Normal left ventricular function.  Normal pulmonary artery pressures   RECOMMENDATIONS:  Continue to use sublingual nitroglycerin as needed for relief of discomfort.  Cannot exclude the possibility of coronary artery spasm.  There was not an indication for PCI on the obtuse marginal as it did not appear to be a hemodynamically significant stenosis.  Alternative explanations for the patient's chest discomfort including GI, musculoskeletal, or other etiology should be considered.  Risk factor modification including 81 mg of aspirin daily     ASSESSMENT & PLAN:    1. Chest pain/unstable angina - Mixed symptoms. Responsive to SL nitro.  She has hx of  moderate non-obstructive disease in multiple vessels with 60-70% stenosis in the ostium of the first obtuse marginal. Discussed with Dr. Meda Coffee. Will get cath for definite evaluation of coronary anatomy.   The patient understands that risks include but are not limited to stroke (1 in 1000), death (1 in 74), kidney failure [usually temporary] (1 in 500), bleeding (1 in 200), allergic reaction [possibly serious] (1 in 200), and agrees to proceed.   2. HTN - intolerant to statin. On Repatha. -04/12/2017: Cholesterol 130; HDL 55.20; LDL Cholesterol 53; Triglycerides 111.0; VLDL 22.2  3. Acute on chronic diastolic CHF - noted 1 + Edema. No orthopnea or PND. Check BNP and add lasix.   4. Abnormal Blood pressure reading - BP of 110/88 on L arm and 128/88 today. mildly reduced pulse on R radial. She did not few occasion at home while her BP was running high on R side but not all the times. Will scan reading in system. Will follow.   Medication  Adjustments/Labs and Tests Ordered: Current medicines are reviewed at length with the patient today.  Concerns regarding medicines are outlined above.  Medication changes, Labs and Tests ordered today are listed in the Patient Instructions below. Patient Instructions  Medication Instructions:  Your physician has recommended you make the following change in your medication:  1.  START Lasix 20 mg taking 1 tablet daily for 2 days then take only as needed for lower extremity swelling  Labwork: TODAY:  PRO BNP  Testing/Procedures: None ordered  Follow-Up: Your physician recommends that you schedule a follow-up appointment in: [place]  Any Other Special Instructions Will Be Listed Below (If Applicable).     If you need a refill on your cardiac medications before your next appointment, please call your pharmacy.      Jarrett Soho, [place]  08/21/2017 12:50 PM    [facility] [address], [place], [place]  [address] Phone: [phone]; Fax: [phone]

## 2017-08-18 NOTE — Telephone Encounter (Signed)
New Message   Pt c/o BP issue:  1. What are your last 5 BP readings? 160/90 130/90 2. Are you having any other symptoms (ex. Dizziness, headache, blurred vision, passed out)? headaches 3. What is your medication issue? None    Pt c/o of Chest Pain: STAT if CP now or developed within 24 hours  1. Are you having CP right now? No   2. Are you experiencing any other symptoms (ex. SOB, nausea, vomiting, sweating)? headaches  3. How long have you been experiencing CP? For about a 3-4 weeks    4. Is your CP continuous or coming and going? Coming and going   5. Have you taken Nitroglycerin? Yes, once last week and then Wednesday of this week   Patient states that she has some tightness in the chest that has caused her to take the nitroglycerin.  ?

## 2017-08-18 NOTE — Progress Notes (Signed)
Cardiology Office Note    Date:  08/21/2017   ID:  Victoria [name], DOB [birth year], MRN [id number]  PCP:  Mosie Lukes, MD  Cardiologist:  Dr. Meda Coffee  Chief Complaint: Chest tightness/palpitations  Pulmonary: [name]   History of Present Illness:   Victoria [name] is a 62 y.o. female with a hx of severe persistent asthma, HL on Repatha, non obstructive CAD and , obesity added to my schedule for chest tightness/palpitations and elevated BP.  In 2016  she  complaints of severe fatigue and worse DOE for the last 6 months.  She had seen her pulmonologist several times and also had been to the emergency room several times. She complained of decreased exercise tolerance with elevated heart rate and blood pressure, palpitations with any type of activity. She c/o chest pain frequently. She noted relief with NTG in the past.  We set her up for LHC and an Echo.  Cardiac cath 11/2014 demonstrated mod non-obstructive CAD with ostial OM1 60%. This was not felt to be severe enough to proceed with PCI.  Vasospasm could not be excluded.  PRN Nitro was recommended.    She was doing well on cardiac stand point when last seen by Dr. Meda Coffee 08/2016. She required multiple rounds of steroids due to asthma exacerbation.   The patient presented today for evaluation of chest pain. She describes her pain as sharp/pressue at substernal area. intermittent without any associated symptoms. She took SL nitro on two separate occasion with improvement symptoms. She denies orthopnea, PND, syncope or melena. Has LE edema. Recently noted elevated bp on R side of arm intermittently.   Past Medical History:  Diagnosis Date  . Allergic bronchopulmonary aspergillosis ([place]) 2008   sees Dr Edmund Hilda pulmonology  . Anemia    iron deficiency, resolved  . Anxiety   . Asthma   . CAD (coronary artery disease)    a. LHC 6/16:  oOM1 60, pRCA 25 >> med Rx  . CAP (community acquired pneumonia) 06/07/2016  . COPD (chronic  obstructive pulmonary disease) ([place])   . Depression    mild  . Diverticulosis   . GERD (gastroesophageal reflux disease)   . H/O hiatal hernia   . History of echocardiogram    Echo 6/16:  Mod LVH, EF 60-65%, no RWMA, Gr 1 DD, trivial MR, normal LA size.  Marland Kitchen Hyperglycemia 11/20/2015  . Hyperlipidemia   . Hyperlipidemia, mixed 09/11/2007   Qualifier: Diagnosis of  By: Jerold Coombe   Did not tolerate Lipitor, zocor, Lovastatin, Pravastatin, Livalo, Crestor even low dose   . IBS (irritable bowel syndrome)   . Maxillary sinusitis   . Normal cardiac stress test 11/2011   No evidence of ischemia or infarct. Calculated ejection fraction 72%.  . Obesity   . OSA (obstructive sleep apnea) 02/2012   has stopped using  cpap  . Osteoarthritis   . Osteoporosis   . Pneumonia 11/2011   "before 2013 I hadn't had pneumonia since I was a child" (04/13/2012)  . Pulmonary nodules   . Schatzki's ring     Past Surgical History:  Procedure Laterality Date  . APPENDECTOMY  1989  . CARDIAC CATHETERIZATION N/A 11/25/2014   Procedure: Right/Left Heart Cath and Coronary Angiography;  Surgeon: Belva Crome, MD;  Location: [facility];  Service: Cardiovascular;  Laterality: N/A;  . CESAREAN SECTION  1985  . HERNIA REPAIR  04/13/2012   VHR laparoscopic  . VENTRAL HERNIA REPAIR  04/13/2012  Procedure: LAPAROSCOPIC VENTRAL HERNIA;  Surgeon: Adin Hector, MD;  Location: [place];  Service: General;  Laterality: N/A;  laparoscopic repair of incarcerated hernia    Current Medications: Prior to Admission medications   Medication Sig Start Date End Date Taking? Authorizing Provider  ALPRAZolam Duanne Moron) 0.5 MG tablet Take 1 tablet (0.5 mg total) by mouth at bedtime as needed for anxiety (Palpatations, SOB). 01/16/17  Yes Opalski, Deborah, DO  aspirin 81 MG tablet Take 81 mg by mouth daily.    Yes [provider]  chlorpheniramine-HYDROcodone (TUSSIONEX PENNKINETIC ER) 10-8 MG/5ML SUER Take 5 mLs by  mouth.   Yes [provider]  Cholecalciferol (VITAMIN D) 2000 UNITS CAPS Take 2,000 Units by mouth daily.    Yes [provider]  cyclobenzaprine (FLEXERIL) 10 MG tablet Take 1 tablet (10 mg total) by mouth 3 (three) times daily as needed for muscle spasms. 05/03/16  Yes Opalski, Deborah, DO  Evolocumab (REPATHA SURECLICK) 809 MG/ML SOAJ Inject 1 pen into the skin every 14 (fourteen) days. 08/12/16  Yes Dorothy Spark, MD  FASENRA 30 MG/ML SOSY Inject 1 Syringe into the muscle every 30 (thirty) days. 08/04/17  Yes [provider]  fexofenadine (ALLEGRA) 180 MG tablet Take 180 mg by mouth daily as needed for allergies.    Yes [provider]  guaiFENesin (MUCINEX) 600 MG 12 hr tablet Take 2 tablets (1,200 mg total) by mouth 2 (two) times daily as needed. 06/10/16  Yes Rizwan, Eunice Blase, MD  ipratropium-albuterol (DUONEB) 0.5-2.5 (3) MG/3ML SOLN INHALE THE CONTENTS OF 1 VIAL VIA NEBULIZER UP TO 4 TO 6 TIMES A DAY. 11/24/15  Yes Brand Males, MD  meloxicam (MOBIC) 7.5 MG tablet Take 1-2 tablets (7.5-15 mg total) by mouth daily. 03/02/17  Yes Mosie Lukes, MD  mometasone (ASMANEX 120 METERED DOSES) 220 MCG/INH inhaler Inhale 2 puffs into the lungs 2 (two) times daily. 01/16/17  Yes Brand Males, MD  montelukast (SINGULAIR) 10 MG tablet TAKE 1 TABLET BY MOUTH ONCE DAILY 12/19/16  Yes Brand Males, MD  nitroGLYCERIN (NITROSTAT) 0.4 MG SL tablet Place 1 tablet (0.4 mg total) under the tongue every 5 (five) minutes as needed for chest pain. 08/26/16  Yes Dorothy Spark, MD  Omeprazole-Sodium Bicarbonate (ZEGERID OTC PO) Take 1 tablet by mouth daily.   Yes [provider]  OVER THE COUNTER MEDICATION Take 1 capsule by mouth daily. Tumeric   Yes [provider]  tiotropium (SPIRIVA HANDIHALER) 18 MCG inhalation capsule INHALE THE CONTENTS OF ONE CAPSULE DAILY 01/16/17  Yes Brand Males, MD  venlafaxine XR (EFFEXOR-XR) 75 MG 24 hr capsule  TAKE 1 CAPSULE BY MOUTH ONCE DAILY ALONG WITH 37.5 MG CAPSULE DAILY. (112.5 MG TOTAL DAILY) 07/20/17  Yes Mosie Lukes, MD     Allergies:   Beclomethasone dipropionate; Budesonide-formoterol fumarate; Mometasone furo-formoterol fum; Sulfonamide derivatives; Pulmicort [budesonide]; and Statins   Social History   Socioeconomic History  . Marital status: Married    Spouse name: None  . Number of children: 1  . Years of education: None  . Highest education level: None  Social Needs  . Financial resource strain: None  . Food insecurity - worry: None  . Food insecurity - inability: None  . Transportation needs - medical: None  . Transportation needs - non-medical: None  Occupational History    Employer: UNEMPLOYED  Tobacco Use  . Smoking status: Never Smoker  . Smokeless tobacco: Never Used  Substance and Sexual Activity  . Alcohol  use: Yes    Alcohol/week: 2.4 oz    Types: 2 Glasses of wine, 2 Cans of beer per week    Comment: "  . Drug use: No  . Sexual activity: Yes    Comment: gluten free, lives with husband and son with CP quadriplegia  Other Topics Concern  . None  Social History Narrative   Cares for a 64yo son with cerebral palsy.      Family History:  The patient's family history includes Breast cancer in her mother and sister; Diabetes in her mother; Diverticulosis in her father; Heart attack in her maternal grandfather; Hypertension in her mother; Prostate cancer in her father; Pulmonary embolism in her brother.   ROS:   Please see the history of present illness.    ROS All other systems reviewed and are negative.   PHYSICAL EXAM:   VS:  BP 110/88   Pulse 69    GEN: Well nourished, well developed, in no acute distress  HEENT: normal  Neck: no JVD, carotid bruits, or masses Cardiac: RRR; no murmurs, rubs, or gallops,1 + BL LE edema. R radial pulse is lightly weaker than left  Respiratory:  clear to auscultation bilaterally, normal work of breathing GI:  soft, nontender, nondistended, + BS MS: no deformity or atrophy  Skin: warm and dry, no rash Neuro:  Alert and Oriented x 3, Strength and sensation are intact Psych: euthymic mood, full affect  Wt Readings from Last 3 Encounters:  07/04/17 160 lb 9.6 oz (72.8 kg)  05/03/17 160 lb (72.6 kg)  04/21/17 163 lb (73.9 kg)      Studies/Labs Reviewed:   EKG:  EKG is ordered today.  The ekg ordered today demonstrates  NSR  Recent Labs: 04/12/2017: ALT 15; BUN 15; Creatinine, Ser 0.63; Potassium 4.7; Sodium 142; TSH 3.16 05/18/2017: Hemoglobin 13.4; Platelets 331.0   Lipid Panel    Component Value Date/Time   CHOL 130 04/12/2017 0805   CHOL 227 (H) 07/25/2016 0822   TRIG 111.0 04/12/2017 0805   HDL 55.20 04/12/2017 0805   HDL 51 07/25/2016 0822   CHOLHDL 2 04/12/2017 0805   VLDL 22.2 04/12/2017 0805   LDLCALC 53 04/12/2017 0805   LDLCALC 160 (H) 07/25/2016 0822   LDLDIRECT 177.8 09/07/2011 0945    Additional studies/ records that were reviewed today include:   Echocardiogram: 08/28/14 Study Conclusions  - Left ventricle: The cavity size was normal. There was moderate   concentric hypertrophy. Systolic function was normal. The   estimated ejection fraction was in the range of 60% to 65%. Wall   motion was normal; there were no regional wall motion   abnormalities. Doppler parameters are consistent with abnormal   left ventricular relaxation (grade 1 diastolic dysfunction). The   E/e&' ratio is between 8-15, suggesting indeterminate LV filling   pressure. - Mitral valve: There was trivial regurgitation. - Left atrium: The atrium was normal in size.  Impressions:  - LVEF 60-65%, modearate LVH, diastolic dysfunction, indeterminate   LV filling pressure, normal LA size.    Right/Left Heart Cath and Coronary Angiography 11/25/14  Conclusion   1. Ost 1st Mrg to 1st Mrg lesion, 60% stenosed. 2. 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 There is ostial/proximal 25-30% narrowing but not the classic slitlike appearance.  60-70% stenosis in the ostium of the  first obtuse marginal.  Otherwise normal coronary arteries.  Normal left ventricular function.  Normal pulmonary artery pressures   RECOMMENDATIONS:  Continue to use sublingual nitroglycerin as needed for relief of discomfort.  Cannot exclude the possibility of coronary artery spasm.  There was not an indication for PCI on the obtuse marginal as it did not appear to be a hemodynamically significant stenosis.  Alternative explanations for the patient's chest discomfort including GI, musculoskeletal, or other etiology should be considered.  Risk factor modification including 81 mg of aspirin daily     ASSESSMENT & PLAN:    1. Chest pain/unstable angina - Mixed symptoms. Responsive to SL nitro.  She has hx of  moderate non-obstructive disease in multiple vessels with 60-70% stenosis in the ostium of the first obtuse marginal. Discussed with Dr. Meda Coffee. Will get cath for definite evaluation of coronary anatomy.   The patient understands that risks include but are not limited to stroke (1 in 1000), death (1 in 16), kidney failure [usually temporary] (1 in 500), bleeding (1 in 200), allergic reaction [possibly serious] (1 in 200), and agrees to proceed.   2. HTN - intolerant to statin. On Repatha. -04/12/2017: Cholesterol 130; HDL 55.20; LDL Cholesterol 53; Triglycerides 111.0; VLDL 22.2  3. Acute on chronic diastolic CHF - noted 1 + Edema. No orthopnea or PND. Check BNP and add lasix.   4. Abnormal Blood pressure reading - BP of 110/88 on L arm and 128/88 today. mildly reduced pulse on R radial. She did not few occasion at home while her BP was running high on R side but not all the times. Will scan reading in system. Will follow.   Medication  Adjustments/Labs and Tests Ordered: Current medicines are reviewed at length with the patient today.  Concerns regarding medicines are outlined above.  Medication changes, Labs and Tests ordered today are listed in the Patient Instructions below. Patient Instructions  Medication Instructions:  Your physician has recommended you make the following change in your medication:  1.  START Lasix 20 mg taking 1 tablet daily for 2 days then take only as needed for lower extremity swelling  Labwork: TODAY:  PRO BNP  Testing/Procedures: None ordered  Follow-Up: Your physician recommends that you schedule a follow-up appointment in: [place]  Any Other Special Instructions Will Be Listed Below (If Applicable).     If you need a refill on your cardiac medications before your next appointment, please call your pharmacy.      Jarrett Soho, [place]  08/21/2017 12:50 PM    [facility] [place], [place], [place]  [address] Phone: [phone]; Fax: [phone]

## 2017-08-21 ENCOUNTER — Encounter: Payer: Self-pay | Admitting: Physician Assistant

## 2017-08-21 ENCOUNTER — Ambulatory Visit (INDEPENDENT_AMBULATORY_CARE_PROVIDER_SITE_OTHER): Payer: PPO | Admitting: Physician Assistant

## 2017-08-21 VITALS — BP 110/88 | HR 69

## 2017-08-21 DIAGNOSIS — I2510 Atherosclerotic heart disease of native coronary artery without angina pectoris: Secondary | ICD-10-CM | POA: Diagnosis not present

## 2017-08-21 DIAGNOSIS — I200 Unstable angina: Secondary | ICD-10-CM | POA: Diagnosis not present

## 2017-08-21 DIAGNOSIS — R600 Localized edema: Secondary | ICD-10-CM | POA: Diagnosis not present

## 2017-08-21 DIAGNOSIS — R0602 Shortness of breath: Secondary | ICD-10-CM | POA: Diagnosis not present

## 2017-08-21 DIAGNOSIS — R002 Palpitations: Secondary | ICD-10-CM | POA: Diagnosis not present

## 2017-08-21 DIAGNOSIS — R0789 Other chest pain: Secondary | ICD-10-CM

## 2017-08-21 DIAGNOSIS — I5032 Chronic diastolic (congestive) heart failure: Secondary | ICD-10-CM | POA: Diagnosis not present

## 2017-08-21 DIAGNOSIS — I2583 Coronary atherosclerosis due to lipid rich plaque: Secondary | ICD-10-CM

## 2017-08-21 LAB — PRO B NATRIURETIC PEPTIDE

## 2017-08-21 MED ORDER — FUROSEMIDE 20 MG PO TABS: ORAL_TABLET | ORAL | 3 refills | Status: DC

## 2017-08-21 NOTE — Patient Instructions (Addendum)
Medication Instructions:  Your physician has recommended you make the following change in your medication:  1.  START Lasix 20 mg taking 1 tablet daily for 2 days then take only as needed for lower extremity swelling  Labwork: TODAY:  PRO BNP, BMET, CBC, & PT/INR  Testing/Procedures: Your physician has requested that you have a cardiac catheterization. Cardiac catheterization is used to diagnose and/or treat various heart conditions. Doctors may recommend this procedure for a number of different reasons. The most common reason is to evaluate chest pain. Chest pain can be a symptom of coronary artery disease (CAD), and cardiac catheterization can show whether plaque is narrowing or blocking your heart's arteries. This procedure is also used to evaluate the valves, as well as measure the blood flow and oxygen levels in different parts of your heart. For further information please visit [email or web address]. Please follow instruction sheet, as given.   Follow-Up: Your physician recommends that you schedule a follow-up appointment in: [place]  Any Other Special Instructions Will Be Listed Below (If Applicable).    [place] [facility] OFFICE [address] [place] [address] Dept: [phone] Loc: [phone]  Ilse Billman  08/21/2017  You are scheduled for a Cardiac Catheterization on Wednesday, March 6 with Dr. Lauree Chandler.  1. Please arrive at the [facility] (Main Entrance A) at [facility]: [address] [place], [place] [address] at 11:00 AM (two hours before your procedure to ensure your preparation). Free valet parking service is available.   Special note: Every effort is made to have your procedure done on time. Please understand that emergencies sometimes delay scheduled procedures.  2. Diet: Do not eat or drink anything after midnight prior to your procedure except  sips of water to take medications.  3. Labs:08/21/17  BMET, CBC, & PT/INR  4. Medication instructions in preparation for your procedure: TAKE AS USUAL  On the morning of your procedure, take your Aspirin and any morning medicines NOT listed above.  You may use sips of water.  5. Plan for one night stay--bring personal belongings. 6. Bring a current list of your medications and current insurance cards. 7. You MUST have a responsible person to drive you home. 8. Someone MUST be with you the first 24 hours after you arrive home or your discharge will be delayed. 9. Please wear clothes that are easy to get on and off and wear slip-on shoes.  Thank you for allowing [name] to care for you!   -- [facility]    If you need a refill on your cardiac medications before your next appointment, please call your pharmacy.

## 2017-08-22 ENCOUNTER — Telehealth: Payer: Self-pay | Admitting: *Deleted

## 2017-08-22 LAB — BASIC METABOLIC PANEL
BUN/Creatinine Ratio: 21 (ref 12–28)
BUN: 13 mg/dL (ref 8–27)
CALCIUM: 9.3 mg/dL (ref 8.7–10.3)
CO2: 24 mmol/L (ref 20–29)
Chloride: 104 mmol/L (ref 96–106)
Creatinine, Ser: 0.62 mg/dL (ref 0.57–1.00)
GFR calc non Af Amer: 98 mL/min/{1.73_m2} (ref >59)
GFR, EST AFRICAN AMERICAN: 113 mL/min/{1.73_m2} (ref >59)
GLUCOSE: 81 mg/dL (ref 65–99)
Potassium: 4.3 mmol/L (ref 3.5–5.2)
Sodium: 143 mmol/L (ref 134–144)

## 2017-08-22 LAB — CBC
HEMATOCRIT: 38.1 % (ref 34.0–46.6)
HEMOGLOBIN: 12.6 g/dL (ref 11.1–15.9)
MCH: 29.2 pg (ref 26.6–33.0)
MCHC: 33.1 g/dL (ref 31.5–35.7)
MCV: 88 fL (ref 79–97)
Platelets: 316 10*3/uL (ref 150–379)
RBC: 4.32 x10E6/uL (ref 3.77–5.28)
RDW: 14.1 % (ref 12.3–15.4)
WBC: 5.3 10*3/uL (ref 3.4–10.8)

## 2017-08-22 LAB — PROTIME-INR
INR: 1 (ref 0.8–1.2)
PROTHROMBIN TIME: 9.9 s (ref 9.1–12.0)

## 2017-08-22 NOTE — Telephone Encounter (Signed)
Tammy please advise on this, thanks. 

## 2017-08-22 NOTE — Telephone Encounter (Signed)
Pt contacted pre-catheterization scheduled at [facility] for: Wednesday August 23, 2017 1 PM Verified arrival time and place: [place] at: 11 AM Nothing to eat or drink after midnight prior to cath.  Hold: Furosemide AM of cath  Except hold medications AM meds can be  taken pre-cath with sip of water including: ASA 81 mg  Confirmed patient has responsible person to drive home post procedure and observe patient for 24 hours: yes

## 2017-08-23 ENCOUNTER — Ambulatory Visit (HOSPITAL_COMMUNITY)
Admission: RE | Admit: 2017-08-23 | Discharge: 2017-08-24 | Disposition: A | Discharge status: Home / Self Care | Payer: PPO | Source: Ambulatory Visit | Attending: Cardiovascular Disease | Admitting: Cardiovascular Disease

## 2017-08-23 ENCOUNTER — Encounter (HOSPITAL_COMMUNITY): Payer: Self-pay | Admitting: General Practice

## 2017-08-23 ENCOUNTER — Ambulatory Visit (HOSPITAL_COMMUNITY)
Admission: RE | Disposition: A | Discharge status: Home / Self Care | Payer: Self-pay | Source: Ambulatory Visit | Attending: Cardiovascular Disease

## 2017-08-23 ENCOUNTER — Other Ambulatory Visit: Payer: Self-pay

## 2017-08-23 DIAGNOSIS — I200 Unstable angina: Secondary | ICD-10-CM | POA: Diagnosis present

## 2017-08-23 DIAGNOSIS — I5032 Chronic diastolic (congestive) heart failure: Secondary | ICD-10-CM | POA: Diagnosis present

## 2017-08-23 DIAGNOSIS — K219 Gastro-esophageal reflux disease without esophagitis: Secondary | ICD-10-CM | POA: Diagnosis not present

## 2017-08-23 DIAGNOSIS — I110 Hypertensive heart disease with heart failure: Secondary | ICD-10-CM | POA: Diagnosis not present

## 2017-08-23 DIAGNOSIS — G4733 Obstructive sleep apnea (adult) (pediatric): Secondary | ICD-10-CM | POA: Insufficient documentation

## 2017-08-23 DIAGNOSIS — E782 Mixed hyperlipidemia: Secondary | ICD-10-CM | POA: Diagnosis present

## 2017-08-23 DIAGNOSIS — Z882 Allergy status to sulfonamides status: Secondary | ICD-10-CM | POA: Insufficient documentation

## 2017-08-23 DIAGNOSIS — Z955 Presence of coronary angioplasty implant and graft: Secondary | ICD-10-CM | POA: Insufficient documentation

## 2017-08-23 DIAGNOSIS — Z7982 Long term (current) use of aspirin: Secondary | ICD-10-CM | POA: Diagnosis not present

## 2017-08-23 DIAGNOSIS — Z791 Long term (current) use of non-steroidal anti-inflammatories (NSAID): Secondary | ICD-10-CM | POA: Diagnosis not present

## 2017-08-23 DIAGNOSIS — I5033 Acute on chronic diastolic (congestive) heart failure: Secondary | ICD-10-CM | POA: Diagnosis not present

## 2017-08-23 DIAGNOSIS — I2510 Atherosclerotic heart disease of native coronary artery without angina pectoris: Secondary | ICD-10-CM | POA: Diagnosis present

## 2017-08-23 DIAGNOSIS — Z95820 Peripheral vascular angioplasty status with implants and grafts: Secondary | ICD-10-CM

## 2017-08-23 DIAGNOSIS — F419 Anxiety disorder, unspecified: Secondary | ICD-10-CM | POA: Diagnosis not present

## 2017-08-23 DIAGNOSIS — I25110 Atherosclerotic heart disease of native coronary artery with unstable angina pectoris: Secondary | ICD-10-CM | POA: Diagnosis not present

## 2017-08-23 DIAGNOSIS — Z6829 Body mass index (BMI) 29.0-29.9, adult: Secondary | ICD-10-CM | POA: Insufficient documentation

## 2017-08-23 DIAGNOSIS — F329 Major depressive disorder, single episode, unspecified: Secondary | ICD-10-CM | POA: Insufficient documentation

## 2017-08-23 DIAGNOSIS — E669 Obesity, unspecified: Secondary | ICD-10-CM | POA: Diagnosis not present

## 2017-08-23 DIAGNOSIS — Z79899 Other long term (current) drug therapy: Secondary | ICD-10-CM | POA: Diagnosis not present

## 2017-08-23 DIAGNOSIS — J449 Chronic obstructive pulmonary disease, unspecified: Secondary | ICD-10-CM | POA: Diagnosis not present

## 2017-08-23 DIAGNOSIS — Z888 Allergy status to other drugs, medicaments and biological substances status: Secondary | ICD-10-CM | POA: Diagnosis not present

## 2017-08-23 DIAGNOSIS — Q245 Malformation of coronary vessels: Secondary | ICD-10-CM | POA: Insufficient documentation

## 2017-08-23 DIAGNOSIS — J45909 Unspecified asthma, uncomplicated: Secondary | ICD-10-CM | POA: Diagnosis present

## 2017-08-23 HISTORY — PX: CORONARY STENT INTERVENTION: CATH118234

## 2017-08-23 HISTORY — DX: Headache: R51

## 2017-08-23 HISTORY — DX: Other chronic pain: G89.29

## 2017-08-23 HISTORY — DX: Low back pain, unspecified: M54.50

## 2017-08-23 HISTORY — DX: Peripheral vascular angioplasty status with implants and grafts: Z95.820

## 2017-08-23 HISTORY — DX: Headache, unspecified: R51.9

## 2017-08-23 HISTORY — DX: Other complications of anesthesia, initial encounter: T88.59XA

## 2017-08-23 HISTORY — PX: CORONARY ANGIOPLASTY WITH STENT PLACEMENT: SHX49

## 2017-08-23 HISTORY — PX: LEFT HEART CATH AND CORONARY ANGIOGRAPHY: CATH118249

## 2017-08-23 HISTORY — DX: Low back pain: M54.5

## 2017-08-23 HISTORY — DX: Adverse effect of unspecified anesthetic, initial encounter: T41.45XA

## 2017-08-23 LAB — POCT ACTIVATED CLOTTING TIME
ACTIVATED CLOTTING TIME: 290 s
ACTIVATED CLOTTING TIME: 406 s

## 2017-08-23 LAB — MRSA PCR SCREENING: MRSA by PCR: POSITIVE — AB

## 2017-08-23 SURGERY — LEFT HEART CATH AND CORONARY ANGIOGRAPHY
Anesthesia: LOCAL

## 2017-08-23 MED ORDER — HEPARIN SODIUM (PORCINE) 1000 UNIT/ML IJ SOLN
INTRAMUSCULAR | Status: AC
  Filled 2017-08-23: qty 1

## 2017-08-23 MED ORDER — MIDAZOLAM HCL 2 MG/2ML IJ SOLN
INTRAMUSCULAR | Status: DC | PRN
  Administered 2017-08-23 (×2): 1 mg via INTRAVENOUS

## 2017-08-23 MED ORDER — VENLAFAXINE HCL ER 150 MG PO CP24
150.0000 mg | ORAL_CAPSULE | Freq: Every day | ORAL | Status: DC
  Administered 2017-08-23 – 2017-08-24 (×2): 150 mg via ORAL
  Filled 2017-08-23 (×2): qty 1

## 2017-08-23 MED ORDER — CLOPIDOGREL BISULFATE 300 MG PO TABS
ORAL_TABLET | ORAL | Status: DC | PRN
Start: 2017-08-23 — End: 2017-08-23
  Administered 2017-08-23: 600 mg via ORAL

## 2017-08-23 MED ORDER — SODIUM CHLORIDE 0.9% FLUSH: 3.0000 mL | Freq: Two times a day (BID) | INTRAVENOUS | Status: DC

## 2017-08-23 MED ORDER — ASPIRIN 81 MG PO CHEW
81.0000 mg | CHEWABLE_TABLET | Freq: Every day | ORAL | Status: DC
  Administered 2017-08-24: 11:00:00 81 mg via ORAL
  Filled 2017-08-23: qty 1

## 2017-08-23 MED ORDER — NITROGLYCERIN 0.4 MG SL SUBL: 0.4000 mg | SUBLINGUAL_TABLET | SUBLINGUAL | Status: DC | PRN

## 2017-08-23 MED ORDER — HEPARIN SODIUM (PORCINE) 1000 UNIT/ML IJ SOLN
INTRAMUSCULAR | Status: DC | PRN
  Administered 2017-08-23: 3500 [IU] via INTRAVENOUS
  Administered 2017-08-23: 6500 [IU] via INTRAVENOUS

## 2017-08-23 MED ORDER — IOPAMIDOL (ISOVUE-370) INJECTION 76%
INTRAVENOUS | Status: DC | PRN
  Administered 2017-08-23: 150 mL via INTRA_ARTERIAL

## 2017-08-23 MED ORDER — SODIUM CHLORIDE 0.9% FLUSH
3.0000 mL | Freq: Two times a day (BID) | INTRAVENOUS | Status: DC
  Administered 2017-08-23: 17:00:00 3 mL via INTRAVENOUS

## 2017-08-23 MED ORDER — NITROGLYCERIN 1 MG/10 ML FOR IR/CATH LAB
INTRA_ARTERIAL | Status: AC
  Filled 2017-08-23: qty 10

## 2017-08-23 MED ORDER — SODIUM CHLORIDE 0.9 % WEIGHT BASED INFUSION
3.0000 mL/kg/h | INTRAVENOUS | Status: DC
  Administered 2017-08-23: 3 mL/kg/h via INTRAVENOUS

## 2017-08-23 MED ORDER — SODIUM CHLORIDE 0.9 % IV SOLN: 250.0000 mL | INTRAVENOUS | Status: DC | PRN

## 2017-08-23 MED ORDER — HYDRALAZINE HCL 20 MG/ML IJ SOLN: 5.0000 mg | INTRAMUSCULAR | Status: AC | PRN

## 2017-08-23 MED ORDER — ASPIRIN 81 MG PO CHEW: 81.0000 mg | CHEWABLE_TABLET | ORAL | Status: DC

## 2017-08-23 MED ORDER — MONTELUKAST SODIUM 10 MG PO TABS
10.0000 mg | ORAL_TABLET | Freq: Every day | ORAL | Status: DC
  Administered 2017-08-23 – 2017-08-24 (×2): 10 mg via ORAL
  Filled 2017-08-23 (×2): qty 1

## 2017-08-23 MED ORDER — SODIUM CHLORIDE 0.9 % IV SOLN: INTRAVENOUS | Status: AC

## 2017-08-23 MED ORDER — SODIUM CHLORIDE 0.9% FLUSH: 3.0000 mL | INTRAVENOUS | Status: DC | PRN

## 2017-08-23 MED ORDER — ALPRAZOLAM 0.5 MG PO TABS: 0.5000 mg | ORAL_TABLET | Freq: Every evening | ORAL | Status: DC | PRN

## 2017-08-23 MED ORDER — LABETALOL HCL 5 MG/ML IV SOLN: 10.0000 mg | INTRAVENOUS | Status: AC | PRN

## 2017-08-23 MED ORDER — TIOTROPIUM BROMIDE MONOHYDRATE 18 MCG IN CAPS
18.0000 ug | ORAL_CAPSULE | Freq: Every day | RESPIRATORY_TRACT | Status: DC
  Administered 2017-08-24: 18 ug via RESPIRATORY_TRACT
  Filled 2017-08-23: qty 5

## 2017-08-23 MED ORDER — MIDAZOLAM HCL 2 MG/2ML IJ SOLN
INTRAMUSCULAR | Status: AC
  Filled 2017-08-23: qty 2

## 2017-08-23 MED ORDER — PANTOPRAZOLE SODIUM 40 MG PO TBEC
40.0000 mg | DELAYED_RELEASE_TABLET | Freq: Every day | ORAL | Status: DC
  Administered 2017-08-23 – 2017-08-24 (×2): 40 mg via ORAL
  Filled 2017-08-23 (×2): qty 1

## 2017-08-23 MED ORDER — VERAPAMIL HCL 2.5 MG/ML IV SOLN
INTRAVENOUS | Status: AC
  Filled 2017-08-23: qty 2

## 2017-08-23 MED ORDER — HEPARIN (PORCINE) IN NACL 2-0.9 UNIT/ML-% IJ SOLN
INTRAMUSCULAR | Status: AC | PRN
  Administered 2017-08-23 (×2): 500 mL via INTRA_ARTERIAL

## 2017-08-23 MED ORDER — ANGIOPLASTY BOOK
Freq: Once | Status: AC
  Administered 2017-08-23: 22:00:00 1
  Filled 2017-08-23: qty 1

## 2017-08-23 MED ORDER — OMEPRAZOLE-SODIUM BICARBONATE 20-1100 MG PO CAPS: 1.0000 | ORAL_CAPSULE | Freq: Every day | ORAL | Status: DC

## 2017-08-23 MED ORDER — ACETAMINOPHEN 325 MG PO TABS
650.0000 mg | ORAL_TABLET | ORAL | Status: DC | PRN
  Administered 2017-08-23: 22:00:00 650 mg via ORAL
  Filled 2017-08-23: qty 2

## 2017-08-23 MED ORDER — ONDANSETRON HCL 4 MG/2ML IJ SOLN: 4.0000 mg | Freq: Four times a day (QID) | INTRAMUSCULAR | Status: DC | PRN

## 2017-08-23 MED ORDER — FENTANYL CITRATE (PF) 100 MCG/2ML IJ SOLN
INTRAMUSCULAR | Status: DC | PRN
  Administered 2017-08-23 (×2): 25 ug via INTRAVENOUS

## 2017-08-23 MED ORDER — CLOPIDOGREL BISULFATE 300 MG PO TABS
ORAL_TABLET | ORAL | Status: AC
  Filled 2017-08-23: qty 2

## 2017-08-23 MED ORDER — IOPAMIDOL (ISOVUE-370) INJECTION 76%
INTRAVENOUS | Status: AC
  Filled 2017-08-23: qty 100

## 2017-08-23 MED ORDER — MUPIROCIN 2 % EX OINT
1.0000 "application " | TOPICAL_OINTMENT | Freq: Two times a day (BID) | CUTANEOUS | Status: DC
  Administered 2017-08-23 – 2017-08-24 (×3): 1 via NASAL
  Filled 2017-08-23: qty 22

## 2017-08-23 MED ORDER — CHLORHEXIDINE GLUCONATE CLOTH 2 % EX PADS: 6.0000 | MEDICATED_PAD | Freq: Every day | CUTANEOUS | Status: DC

## 2017-08-23 MED ORDER — VERAPAMIL HCL 2.5 MG/ML IV SOLN
INTRAVENOUS | Status: DC | PRN
  Administered 2017-08-23: 14:00:00 via INTRA_ARTERIAL

## 2017-08-23 MED ORDER — LIDOCAINE HCL (PF) 1 % IJ SOLN
INTRAMUSCULAR | Status: DC | PRN
  Administered 2017-08-23: 2 mL

## 2017-08-23 MED ORDER — CLOPIDOGREL BISULFATE 75 MG PO TABS
75.0000 mg | ORAL_TABLET | Freq: Every day | ORAL | Status: DC
  Administered 2017-08-24: 11:00:00 75 mg via ORAL
  Filled 2017-08-23: qty 1

## 2017-08-23 MED ORDER — HEPARIN (PORCINE) IN NACL 2-0.9 UNIT/ML-% IJ SOLN
INTRAMUSCULAR | Status: AC
  Filled 2017-08-23: qty 1000

## 2017-08-23 MED ORDER — FENTANYL CITRATE (PF) 100 MCG/2ML IJ SOLN
INTRAMUSCULAR | Status: AC
  Filled 2017-08-23: qty 2

## 2017-08-23 MED ORDER — SODIUM CHLORIDE 0.9 % WEIGHT BASED INFUSION: 1.0000 mL/kg/h | INTRAVENOUS | Status: DC

## 2017-08-23 SURGICAL SUPPLY — 16 items
BALLN EMERGE MR 2.5X12 (BALLOONS) ×2
BALLOON EMERGE MR 2.5X12 (BALLOONS) IMPLANT
CATH IMPULSE 5F ANG/FL3.5 (CATHETERS) ×1 IMPLANT
CATH VISTA GUIDE 6FR XB3 (CATHETERS) ×1 IMPLANT
DEVICE RAD COMP TR BAND LRG (VASCULAR PRODUCTS) ×1 IMPLANT
GLIDESHEATH SLEND SS 6F .021 (SHEATH) ×1 IMPLANT
GUIDEWIRE INQWIRE 1.5J.035X260 (WIRE) IMPLANT
INQWIRE 1.5J .035X260CM (WIRE) ×2
KIT ENCORE 26 ADVANTAGE (KITS) ×1 IMPLANT
KIT HEART LEFT (KITS) ×2 IMPLANT
PACK CARDIAC CATHETERIZATION (CUSTOM PROCEDURE TRAY) ×2 IMPLANT
STENT SYNERGY DES 3X16 (Permanent Stent) ×1 IMPLANT
SYR MEDRAD MARK V 150ML (SYRINGE) ×2 IMPLANT
TRANSDUCER W/STOPCOCK (MISCELLANEOUS) ×2 IMPLANT
TUBING CIL FLEX 10 FLL-RA (TUBING) ×2 IMPLANT
WIRE COUGAR XT STRL 190CM (WIRE) ×2 IMPLANT

## 2017-08-23 NOTE — Research (Signed)
[place] Study Informed Consent   Subject Name: [name]  Subject met inclusion and exclusion criteria.  The informed consent form, study requirements and expectations were reviewed with the subject and questions and concerns were addressed prior to the signing of the consent form.  The subject verbalized understanding of the trial requirements.  The subject agreed to participate in the CADFEM trial and signed the informed consent 1205 on 08/23/2017.  The informed consent was obtained prior to performance of any protocol-specific procedures for the subject.  A copy of the signed informed consent was given to the subject and a copy was placed in the subject's medical record.  Blossom Hoops 08/23/2017, 12:05 PM

## 2017-08-23 NOTE — Interval H&P Note (Signed)
History and Physical Interval Note:  08/23/2017 1:07 PM  Victoria [name]  has presented today for cardiac cath with the diagnosis of unstable angina. The various methods of treatment have been discussed with the patient and family. After consideration of risks, benefits and other options for treatment, the patient has consented to  Procedure(s): LEFT HEART CATH AND CORONARY ANGIOGRAPHY (N/A) as a surgical intervention .  The patient's history has been reviewed, patient examined, no change in status, stable for surgery.  I have reviewed the patient's chart and labs.  Questions were answered to the patient's satisfaction.    Cath Lab Visit (complete for each Cath Lab visit)  Clinical Evaluation Leading to the Procedure:   ACS: No.  Non-ACS:    Anginal Classification: CCS III  Anti-ischemic medical therapy: No Therapy  Non-Invasive Test Results: No non-invasive testing performed  Prior CABG: No previous CABG        Lauree Chandler

## 2017-08-24 ENCOUNTER — Encounter (HOSPITAL_COMMUNITY): Payer: Self-pay | Admitting: Cardiovascular Disease

## 2017-08-24 ENCOUNTER — Ambulatory Visit: Payer: PPO

## 2017-08-24 ENCOUNTER — Other Ambulatory Visit: Payer: Self-pay

## 2017-08-24 DIAGNOSIS — I200 Unstable angina: Secondary | ICD-10-CM

## 2017-08-24 DIAGNOSIS — E669 Obesity, unspecified: Secondary | ICD-10-CM | POA: Diagnosis not present

## 2017-08-24 DIAGNOSIS — Z955 Presence of coronary angioplasty implant and graft: Secondary | ICD-10-CM | POA: Diagnosis not present

## 2017-08-24 DIAGNOSIS — I25110 Atherosclerotic heart disease of native coronary artery with unstable angina pectoris: Secondary | ICD-10-CM | POA: Diagnosis not present

## 2017-08-24 DIAGNOSIS — I5033 Acute on chronic diastolic (congestive) heart failure: Secondary | ICD-10-CM | POA: Diagnosis not present

## 2017-08-24 DIAGNOSIS — Z882 Allergy status to sulfonamides status: Secondary | ICD-10-CM | POA: Diagnosis not present

## 2017-08-24 DIAGNOSIS — J449 Chronic obstructive pulmonary disease, unspecified: Secondary | ICD-10-CM | POA: Diagnosis not present

## 2017-08-24 DIAGNOSIS — Z6829 Body mass index (BMI) 29.0-29.9, adult: Secondary | ICD-10-CM | POA: Diagnosis not present

## 2017-08-24 DIAGNOSIS — Z95820 Peripheral vascular angioplasty status with implants and grafts: Secondary | ICD-10-CM

## 2017-08-24 DIAGNOSIS — Z888 Allergy status to other drugs, medicaments and biological substances status: Secondary | ICD-10-CM | POA: Diagnosis not present

## 2017-08-24 DIAGNOSIS — G4733 Obstructive sleep apnea (adult) (pediatric): Secondary | ICD-10-CM | POA: Diagnosis not present

## 2017-08-24 DIAGNOSIS — E782 Mixed hyperlipidemia: Secondary | ICD-10-CM | POA: Diagnosis not present

## 2017-08-24 DIAGNOSIS — Q245 Malformation of coronary vessels: Secondary | ICD-10-CM | POA: Diagnosis not present

## 2017-08-24 DIAGNOSIS — I110 Hypertensive heart disease with heart failure: Secondary | ICD-10-CM | POA: Diagnosis not present

## 2017-08-24 HISTORY — DX: Peripheral vascular angioplasty status with implants and grafts: Z95.820

## 2017-08-24 LAB — BASIC METABOLIC PANEL
ANION GAP: 6 (ref 5–15)
BUN: 11 mg/dL (ref 6–20)
CO2: 27 mmol/L (ref 22–32)
Calcium: 8.7 mg/dL — ABNORMAL LOW (ref 8.9–10.3)
Chloride: 106 mmol/L (ref 101–111)
Creatinine, Ser: 0.67 mg/dL (ref 0.44–1.00)
Glucose, Bld: 95 mg/dL (ref 65–99)
Potassium: 4.3 mmol/L (ref 3.5–5.1)
SODIUM: 139 mmol/L (ref 135–145)

## 2017-08-24 LAB — CBC
HCT: 37.4 % (ref 36.0–46.0)
HEMOGLOBIN: 12.2 g/dL (ref 12.0–15.0)
MCH: 29.3 pg (ref 26.0–34.0)
MCHC: 32.6 g/dL (ref 30.0–36.0)
MCV: 89.9 fL (ref 78.0–100.0)
PLATELETS: 254 10*3/uL (ref 150–400)
RBC: 4.16 MIL/uL (ref 3.87–5.11)
RDW: 13.4 % (ref 11.5–15.5)
WBC: 4.8 10*3/uL (ref 4.0–10.5)

## 2017-08-24 MED ORDER — CLOPIDOGREL BISULFATE 75 MG PO TABS: 75.0000 mg | ORAL_TABLET | Freq: Every day | ORAL | 6 refills | Status: DC

## 2017-08-24 MED ORDER — PANTOPRAZOLE SODIUM 40 MG PO TBEC: 40.0000 mg | DELAYED_RELEASE_TABLET | Freq: Every day | ORAL | 6 refills | Status: DC

## 2017-08-24 MED FILL — Nitroglycerin IV Soln 100 MCG/ML in D5W: INTRA_ARTERIAL | Qty: 10 | Status: AC

## 2017-08-24 MED FILL — Heparin Sodium (Porcine) 2 Unit/ML in Sodium Chloride 0.9%: INTRAMUSCULAR | Qty: 1000 | Status: AC

## 2017-08-24 NOTE — Progress Notes (Signed)
TR BAND REMOVAL  LOCATION:    right radial  DEFLATED PER PROTOCOL:    Yes.    TIME BAND OFF / DRESSING APPLIED:    1830   SITE UPON ARRIVAL:    Level 0  SITE AFTER BAND REMOVAL:    Level 0  CIRCULATION SENSATION AND MOVEMENT:    Within Normal Limits   Yes.    COMMENTS:   TOLERATED PROCEDURE WELL

## 2017-08-24 NOTE — Discharge Instructions (Signed)
Call [facility] at [phone] if any bleeding, swelling or drainage at cath site.  May shower, no tub baths for 48 hours for groin sticks. No lifting over 5 pounds for 3 days.  No Driving for 3 days if you drive.  [place] Healthy diet   We changed your zegrid to protonix, the zegrid may interfere with the Plavix.    Do not stop Asprin or Plavix, this is to keep your stent open.  Stopping could cause a heart attack.   You have been referred to cardiac rehab

## 2017-08-24 NOTE — Progress Notes (Signed)
Progress Note  Patient Name: Victoria [name] Date of Encounter: 08/24/2017  Primary Cardiologist: No primary care provider on file.   Subjective   Feels well, no CP, no SOB  Inpatient Medications    Scheduled Meds: . aspirin  81 mg Oral Daily  . Chlorhexidine Gluconate Cloth  6 each Topical Q0600  . clopidogrel  75 mg Oral Q breakfast  . montelukast  10 mg Oral Daily  . mupirocin ointment  1 application Nasal BID  . pantoprazole  40 mg Oral Daily  . sodium chloride flush  3 mL Intravenous Q12H  . tiotropium  18 mcg Inhalation Daily  . venlafaxine XR  150 mg Oral Q breakfast   Continuous Infusions: . sodium chloride     PRN Meds: sodium chloride, acetaminophen, ALPRAZolam, nitroGLYCERIN, ondansetron (ZOFRAN) IV, sodium chloride flush   Vital Signs    Vitals:   08/23/17 1924 08/23/17 1946 08/23/17 2000 08/24/17 0707  BP: 131/69 117/69  (!) 131/94  Pulse: 84   77  Resp: 19 17 15 13   Temp: 98.4 F (36.9 C) 97.8 F (36.6 C)  (!) 97.5 F (36.4 C)  TempSrc: Oral Oral  Oral  SpO2: 100% 100%  100%  Weight:    160 lb 15 oz (73 kg)  Height:        Intake/Output Summary (Last 24 hours) at 08/24/2017 0855 Last data filed at 08/24/2017 0600 Gross per 24 hour  Intake 480 ml  Output 1950 ml  Net -1470 ml   Filed Weights   08/23/17 1111 08/24/17 0707  Weight: 160 lb (72.6 kg) 160 lb 15 oz (73 kg)    Telemetry    No arrhy - Personally Reviewed  ECG    normal - Personally Reviewed  Physical Exam   GEN: No acute distress.   Neck: No JVD Cardiac: RRR, no murmurs, rubs, or gallops.  Respiratory: Clear to auscultation bilaterally. Cath site normal GI: Soft, nontender, non-distended  MS: No edema; No deformity. Neuro:  Nonfocal  Psych: Normal affect   Labs    Chemistry Recent Labs  Lab 08/21/17 1412 08/24/17 0349  NA 143 139  K 4.3 4.3  CL 104 106  CO2 24 27  GLUCOSE 81 95  BUN 13 11  CREATININE 0.62 0.67  CALCIUM 9.3 8.7*  GFRNONAA 98 >60  GFRAA 113  >60  ANIONGAP  --  6     Hematology Recent Labs  Lab 08/21/17 1412 08/24/17 0349  WBC 5.3 4.8  RBC 4.32 4.16  HGB 12.6 12.2  HCT 38.1 37.4  MCV 88 89.9  MCH 29.2 29.3  MCHC 33.1 32.6  RDW 14.1 13.4  PLT 316 254    Cardiac EnzymesNo results for input(s): TROPONINI in the last 168 hours. No results for input(s): TROPIPOC in the last 168 hours.   BNP Recent Labs  Lab 08/21/17 1002  PROBNP <5     DDimer No results for input(s): DDIMER in the last 168 hours.   Radiology    No results found.  Cardiac Studies   Cath 08/23/17  Ost 2nd Mrg lesion is 95% stenosed.  A drug-eluting stent was successfully placed using a STENT SYNERGY DES 3X16.  Post intervention, there is a 0% residual stenosis.  The left ventricular systolic function is normal.  LV end diastolic pressure is normal.  The left ventricular ejection fraction is 55-65% by visual estimate.  There is no mitral valve regurgitation.   1. Severe single vessel CAD with severe stenosis  in the ostium of the large obtuse marginal branch.  2. Successful PTCA/DES x 1 ostium of the OM. 3. 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  4. The LAD has no obstructive disease.  5. Normal LV systolic function  Patient Profile     62 y.o. female with DES to OM, HL   Assessment & Plan    CAD/ unstable angina  - post OM stent  - DAPT  HL   - Repatha  HTN  - stable  Chronic diastolic HF  - stable  OK forDC  For questions or updates, please contact [facility] Please consult [email or web address] for contact info under Cardiology/STEMI.      Signed, Candee Furbish, MD  08/24/2017, 8:55 AM

## 2017-08-24 NOTE — Progress Notes (Signed)
CARDIAC REHAB PHASE I   PRE:  Rate/Rhythm: 86 SR  BP:  Supine:   Sitting: 125/60  Standing:    SaO2: 98%RA  MODE:  Ambulation: 500 ft   POST:  Rate/Rhythm: 107 ST  BP:  Supine:   Sitting: 128/68  Standing:    SaO2: 100%RA 0755-0855 Pt walked 500 ft on RA with steady gait and no CP. No SOB noted. Sats good. Education completed with pt who voiced understanding. Stressed importance of plavix with stent. Reviewed NTG use, ex ed, heart healthy food choices, CRP 2. Will refer to [place] program.    Graylon Good, RN BSN  08/24/2017 8:52 AM

## 2017-08-24 NOTE — Discharge Summary (Addendum)
Discharge Summary    Patient ID: Victoria [name],  MRN: [id number], DOB/AGE: 11/07/1955 62 y.o.  Admit date: 08/23/2017 Discharge date: 08/24/2017  Primary Care Provider: [name] A Primary Cardiologist: Ena Dawley, MD  Discharge Diagnoses    Principal Problem:   CAD (coronary artery disease) Active Problems:   S/P angioplasty with stent 08/23/17 ostial 2nd OM with DES synnergy   Hyperlipidemia, mixed   Asthma   Chronic diastolic CHF (congestive heart failure), NYHA class 2 (HCC)   Unstable angina (HCC)   Allergies Allergies  Allergen Reactions  . Beclomethasone Dipropionate Hives and Other (See Comments)     weight gain  . Budesonide-Formoterol Fumarate Hives  . Mometasone Furo-Formoterol Fum Hives and Other (See Comments)    weight gain  . Sulfonamide Derivatives Hives and Rash  . Pulmicort [Budesonide]     Headaches   . Statins     Myalgias, RLS    Diagnostic Studies/Procedures    08/23/17  LEFT HEART CATH AND CORONARY ANGIOGRAPHY  Conclusion     Ost 2nd Mrg lesion is 95% stenosed.  A drug-eluting stent was successfully placed using a STENT SYNERGY DES 3X16.  Post intervention, there is a 0% residual stenosis.  The left ventricular systolic function is normal.  LV end diastolic pressure is normal.  The left ventricular ejection fraction is 55-65% by visual estimate.  There is no mitral valve regurgitation.   1. Severe single vessel CAD with severe stenosis in the ostium of the large obtuse marginal branch.  2. Successful PTCA/DES x 1 ostium of the OM. 3. 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  4. The LAD has no obstructive disease.  5. Normal LV systolic function  Recommendations: Will continue DAPT with ASA and Plavix for at least one year. Consider addition of a beta blocker or nitrate.      _____________   History of Present Illness     62 y.o. female with a hx of  severe persistent asthma, HL on Repatha, non obstructive CAD and , obesity was seen in the office for chest tightness/palpitations and elevated BP.  In 2016 she  complaints of severe fatigue and worse DOE for the last 6 months. She had seen her pulmonologist several times and also had been to the emergency room several times. She complained of decreased exercise tolerance with elevated heart rate and blood pressure, palpitations with any type of activity. She c/o chest pain frequently. She noted relief with NTG in the past. We set her up for LHC and an Echo. Cardiac cath 11/2014 demonstrated mod non-obstructive CAD with ostial OM1 60%. This was not felt to be severe enough to proceed with PCI. Vasospasm could not be excluded. PRN Nitro was recommended.   She was doing well on cardiac stand point when last seen by Dr. Meda Coffee 08/2016. She required multiple rounds of steroids due to asthma exacerbation  She describes her pain as sharp/pressue at substernal area. intermittent without any associated symptoms. She took SL nitro on two separate occasion with improvement symptoms.   It was decided with her unstable angina and known CAD she should have cardiac cath.   She presented 08/23/17 for cath.     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  2nd OM with 95% stenosis and rec'd PCI with  synergy DES.  EF 55-65%.  Will continue DAPT with ASA and Plavix for at least one year  By the next AM pt was stable by Dr. Marlou Porch for discharge.  She ambulated with cardiac rehab and referred for phase II.  She will continue Repatha for HLD.  She is on ASA and plavix.    Not on BB, will be discussed at [place], she has Asthma and recent exacerbation.   _____________  Discharge Vitals Blood pressure (!) 131/94, pulse 77, temperature (!) 97.5 F (36.4 C), temperature source Oral, resp. rate 13, height 5\' 2"   (1.575 m), weight 160 lb 15 oz (73 kg), SpO2 100 %.  Filed Weights   08/23/17 1111 08/24/17 0707  Weight: 160 lb (72.6 kg) 160 lb 15 oz (73 kg)    Labs & Radiologic Studies    CBC Recent Labs    08/21/17 1412 08/24/17 0349  WBC 5.3 4.8  HGB 12.6 12.2  HCT 38.1 37.4  MCV 88 89.9  PLT 316 101   Basic Metabolic Panel Recent Labs    08/21/17 1412 08/24/17 0349  NA 143 139  K 4.3 4.3  CL 104 106  CO2 24 27  GLUCOSE 81 95  BUN 13 11  CREATININE 0.62 0.67  CALCIUM 9.3 8.7*   Liver Function Tests No results for input(s): AST, ALT, ALKPHOS, BILITOT, PROT, ALBUMIN in the last 72 hours. No results for input(s): LIPASE, AMYLASE in the last 72 hours. Cardiac Enzymes No results for input(s): CKTOTAL, CKMB, CKMBINDEX, TROPONINI in the last 72 hours. BNP Invalid input(s): POCBNP D-Dimer No results for input(s): DDIMER in the last 72 hours. Hemoglobin A1C No results for input(s): HGBA1C in the last 72 hours. Fasting Lipid Panel No results for input(s): CHOL, HDL, LDLCALC, TRIG, CHOLHDL, LDLDIRECT in the last 72 hours. Thyroid Function Tests No results for input(s): TSH, T4TOTAL, T3FREE, THYROIDAB in the last 72 hours.  Invalid input(s): FREET3 _____________  No results found. Disposition   Pt is being discharged home today in good condition.  Follow-up Plans & Appointments  Call [facility] at [phone] if any bleeding, swelling or drainage at cath site.  May shower, no tub baths for 48 hours for groin sticks. No lifting over 5 pounds for 3 days.  No Driving for 3 days if you drive.  [place] [employer] diet   We changed your zegrid to protonix, the zegrid may interfere with the Plavix.    Do not stop Asprin or Plavix, this is to keep your stent open.  Stopping could cause a heart attack.    Follow-up Information    Dorothy Spark, MD Follow up on 09/04/2017.   Specialty:  Cardiology Why:  at 8:40 AM  Contact information: [place] [address] [phone]          Discharge Instructions    Amb Referral to Cardiac Rehabilitation   Complete by:  As directed    Diagnosis:  Coronary Stents      Discharge Medications   Allergies as of 08/24/2017      Reactions   Beclomethasone Dipropionate Hives, Other (See Comments)    weight gain   Budesonide-formoterol Fumarate Hives   Mometasone Furo-formoterol Fum Hives, Other (See Comments)   weight gain   Sulfonamide Derivatives Hives, Rash   Pulmicort [budesonide]    Headaches   Statins    Myalgias, RLS      Medication List    STOP taking these  medications   meloxicam 7.5 MG tablet Commonly known as:  MOBIC   OVER THE COUNTER MEDICATION   ZEGERID OTC PO     TAKE these medications   ALPRAZolam 0.5 MG tablet Commonly known as:  XANAX Take 1 tablet (0.5 mg total) by mouth at bedtime as needed for anxiety (Palpatations, SOB).   aspirin 81 MG tablet Take 81 mg by mouth daily.   Calcium-Magnesium-Vitamin D 300-150-400 MG-MG-UNIT Tabs Take 1 tablet by mouth daily.   clopidogrel 75 MG tablet Commonly known as:  PLAVIX Take 1 tablet (75 mg total) by mouth daily with breakfast.   cyclobenzaprine 10 MG tablet Commonly known as:  FLEXERIL Take 1 tablet (10 mg total) by mouth 3 (three) times daily as needed for muscle spasms.   Evolocumab 140 MG/ML Soaj Commonly known as:  REPATHA SURECLICK Inject 1 pen into the skin every 14 (fourteen) days.   FASENRA 30 MG/ML Sosy Generic drug:  Benralizumab Inject 1 Syringe into the muscle every 30 (thirty) days.   fexofenadine 180 MG tablet Commonly known as:  ALLEGRA Take 180 mg by mouth daily as needed for allergies.   furosemide 20 MG tablet Commonly known as:  LASIX Take 1 tablet by mouth daily for 2 days then take only as needed for swelling   guaiFENesin 600 MG 12 hr tablet Commonly known as:  MUCINEX Take 2 tablets (1,200 mg total) by mouth 2 (two) times daily as  needed. What changed:  reasons to take this   ipratropium-albuterol 0.5-2.5 (3) MG/3ML Soln Commonly known as:  DUONEB INHALE THE CONTENTS OF 1 VIAL VIA NEBULIZER UP TO 4 TO 6 TIMES A DAY. What changed:  See the new instructions.   Magnesium Oxide 250 MG Tabs Take 250 mg by mouth daily.   mometasone 220 MCG/INH inhaler Commonly known as:  ASMANEX 120 METERED DOSES Inhale 2 puffs into the lungs 2 (two) times daily. What changed:  how much to take   montelukast 10 MG tablet Commonly known as:  SINGULAIR TAKE 1 TABLET BY MOUTH ONCE DAILY   nitroGLYCERIN 0.4 MG SL tablet Commonly known as:  NITROSTAT Place 1 tablet (0.4 mg total) under the tongue every 5 (five) minutes as needed for chest pain.   pantoprazole 40 MG tablet Commonly known as:  PROTONIX Take 1 tablet (40 mg total) by mouth daily.   tiotropium 18 MCG inhalation capsule Commonly known as:  SPIRIVA HANDIHALER INHALE THE CONTENTS OF ONE CAPSULE DAILY   venlafaxine XR 150 MG 24 hr capsule Commonly known as:  EFFEXOR-XR Take 150 mg by mouth daily with breakfast.   vitamin C 500 MG tablet Commonly known as:  ASCORBIC ACID Take 500 mg by mouth daily.   Vitamin D 2000 units Caps Take 2,000 Units by mouth daily.   zinc gluconate 50 MG tablet Take 50 mg by mouth daily.        Aspirin prescribed at discharge?  Yes High Intensity Statin Prescribed? (Lipitor 40-80mg  or Crestor 20-40mg ): Yes Beta Blocker Prescribed? No: will need to be discussed at OV with Dr. Meda Coffee, pt has hx of asthma and recent exacerbation.  For EF <40%, was ACEI/ARB Prescribed? No: NA ADP Receptor Inhibitor Prescribed? (i.e. Plavix etc.-Includes Medically Managed Patients): Yes For EF <40%, Aldosterone Inhibitor Prescribed? No: NA Was EF assessed during THIS hospitalization? Yes Was Cardiac Rehab II ordered? (Included Medically managed Patients): Yes   Outstanding Labs/Studies  none   Duration of Discharge Encounter   Greater than 30  minutes  including physician time.  Signed, Cecilie Kicks NP 08/24/2017, 11:24 AM  Personally seen and examined. Agree with above. Ambulating well, RRR, CTAB, no edema, cath site normal. Labs reviewed.    CAD/ unstable angina  - post OM stent  - DAPT  HL   - Repatha  HTN  - stable  Chronic diastolic HF  - stable  OK for DC Candee Furbish, MD

## 2017-08-24 NOTE — Progress Notes (Signed)
Initial visit with patient; patient saw the Chaplain in the hall, while the Chaplain was visiting with another family next door. Patient requested the Chaplain to come back and pay a visit to her. Talked with the patient, provided spiritual support, and offered prayer. Chaplain is available to follow up with patient as needed.    08/24/17 1000  Clinical Encounter Type  Visited With Patient  Visit Type Initial;Spiritual support  Referral From Patient  Consult/Referral To Chaplain  Spiritual Encounters  Spiritual Needs Prayer;Emotional  Stress Factors  Patient Stress Factors None identified  Family Stress Factors None identified   Redgie Grayer

## 2017-08-25 ENCOUNTER — Telehealth (HOSPITAL_COMMUNITY): Payer: Self-pay

## 2017-08-25 NOTE — Telephone Encounter (Signed)
Patients insurance is active and benefits verified through [employer] - $15.00 co-pay, no deductible, out of pocket amount of $3,400/$4,759.88 has been met, no co-insurance, and no pre-authorization is required. Spoke with [employer] - reference [id number]  Patient will be contacted for scheduling upon review by the RN Navigator.

## 2017-08-29 ENCOUNTER — Telehealth (HOSPITAL_COMMUNITY): Payer: Self-pay

## 2017-08-29 ENCOUNTER — Encounter: Payer: Self-pay | Admitting: Cardiology

## 2017-08-29 NOTE — Telephone Encounter (Signed)
Attempted to call patient in regards to Cardiac Rehab - lm on vm °

## 2017-08-29 NOTE — Telephone Encounter (Signed)
Called [facility] they are they are the ones that told me to call [facility]. They also sent her fasenra to [name].  I received a referral receipt notification today. I'm calling to check on it now.

## 2017-08-30 ENCOUNTER — Telehealth: Payer: Self-pay | Admitting: *Deleted

## 2017-08-30 NOTE — Telephone Encounter (Signed)
Victoria [name],   Will you please take a look back at her charges to ensure PAN has paid. Thanks.

## 2017-08-30 NOTE — Telephone Encounter (Signed)
Pt is calling back [phone]

## 2017-08-30 NOTE — Telephone Encounter (Signed)
Working with [name] on financial concerns. Left message in detail for pt. To go ahead and make fasenra appt.

## 2017-08-30 NOTE — Telephone Encounter (Signed)
Please see phone note 08/09/17 for further information.

## 2017-09-01 NOTE — Telephone Encounter (Signed)
The patient's medicine was coming from a specialty pharmacy, so she did not have any costs from [name] that PAN would cover.

## 2017-09-04 ENCOUNTER — Ambulatory Visit: Payer: PPO | Admitting: Cardiology

## 2017-09-04 NOTE — Telephone Encounter (Signed)
Routing this to Craig do you have any updates on this, could you also call the patient.

## 2017-09-05 ENCOUNTER — Telehealth (HOSPITAL_COMMUNITY): Payer: Self-pay

## 2017-09-05 NOTE — Telephone Encounter (Signed)
Spoke with pt she has decided to use the PAN funds she has left and make an appt. For her next inj.  There are 2 things [place] needs to get started. I spoke with Clifton James today we have the rx, once she mails me her Social Security statement, I'll fax them together with a cover sheet. (As instructed by Clifton James)

## 2017-09-05 NOTE — Telephone Encounter (Signed)
Called and spoke with patient in regards to Cardiac Rehab - Patient stated she is not interested in the program as she is currently exercising on her own. Closed referral.

## 2017-09-05 NOTE — Telephone Encounter (Signed)
Victoria [name] please contact the patient and make sure she had been giving her PAN information to the [facility] to help with the cost of the medication. Thanks.

## 2017-09-05 NOTE — Telephone Encounter (Signed)
Any updates Tam?

## 2017-09-05 NOTE — Telephone Encounter (Signed)
I will call her tomorrow,  Already interrupted her meeting one time. ( She having at her home.)

## 2017-09-06 DIAGNOSIS — H5203 Hypermetropia, bilateral: Secondary | ICD-10-CM | POA: Diagnosis not present

## 2017-09-06 NOTE — Telephone Encounter (Signed)
I called Mrs. Victoria [name] about making sure her PAN info was given to [facility]. Pt. Understood and will call PAN when she gets home. Will route to [name] to make her aware. Nothing further needed.

## 2017-09-07 ENCOUNTER — Ambulatory Visit (INDEPENDENT_AMBULATORY_CARE_PROVIDER_SITE_OTHER): Payer: PPO

## 2017-09-07 DIAGNOSIS — J82 Pulmonary eosinophilia, not elsewhere classified: Secondary | ICD-10-CM | POA: Diagnosis not present

## 2017-09-07 DIAGNOSIS — J8283 Eosinophilic asthma: Secondary | ICD-10-CM

## 2017-09-08 MED ORDER — BENRALIZUMAB 30 MG/ML ~~LOC~~ SOSY
30.0000 mg | PREFILLED_SYRINGE | Freq: Once | SUBCUTANEOUS | Status: AC
  Administered 2017-09-07: 30 mg via SUBCUTANEOUS

## 2017-09-12 ENCOUNTER — Encounter: Payer: Self-pay | Admitting: Physician Assistant

## 2017-09-12 NOTE — Progress Notes (Signed)
Cardiology Office Note    Date:  09/13/2017   ID:  Victoria, [name] 25-Aug-1955, MRN [id number]  PCP:  Mosie Lukes, MD  Cardiologist:  Dr. Meda Coffee Pulmonary: [name]   Chief Complaint: Hospital follow up s/p stenting  History of Present Illness:   Victoria [name] is a 62 y.o. female of severe persistent asthma, HL on Repatha, obesity presents for cath follow up.   In 2016 she  complaints of severe fatigue and worse DOE for the last 6 months. She had seen her pulmonologist several times and also had been to the emergency room several times. She complained of decreased exercise tolerance with elevated heart rate and blood pressure, palpitations with any type of activity. She c/o chest pain frequently. She noted relief with NTG in the past. We set her up for LHC and an Echo. Cardiac cath 11/2014 demonstrated mod non-obstructive CAD with ostial OM1 60%. This was not felt to be severe enough to proceed with PCI. Vasospasm could not be excluded. PRN Nitro was recommended.   Seen by me 08/21/17 for chest pain for symptoms concerning for unstable angina.  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 2nd OM with 95% stenosis s/p synergy DES.  EF 55-65%.  Will continue DAPT with ASA and Plavix for at least one year. Not on BB, will be discussed at [place], she has Asthma and recent exacerbation.    Here today for follow up. No recurrent angina. She is now able to walk uphill. No chest pain, SOB, orthopnea, palpitations, PND, LE edema or melena. She did not tolerated protonix, discontinued by herself, now taker apple cider vinegar with improved symptoms.    Past Medical History:  Diagnosis Date  . Allergic bronchopulmonary aspergillosis ([place]) 2008   sees Dr Edmund Hilda pulmonology  . Anemia    iron deficiency, resolved  . Anxiety   . Asthma   . CAD (coronary  artery disease)    a. LHC 6/16:  oOM1 60, pRCA 25 >> med Rx b. cath 3/19 2nd OM with 95% stenosis s/p synergy DES & anomalous RCA  . CAP (community acquired pneumonia) 2016; 06/07/2016  . Chronic bronchitis ([place])   . Chronic lower back pain   . Complication of anesthesia    "think I have a hard time waking up from it"  . COPD (chronic obstructive pulmonary disease) ([place])   . Depression    mild  . Diverticulosis   . GERD (gastroesophageal reflux disease)   . H/O hiatal hernia   . Headache    "weekly" (08/23/2017)  . History of echocardiogram    Echo 6/16:  Mod LVH, EF 60-65%, no RWMA, Gr 1 DD, trivial MR, normal LA size.  Marland Kitchen Hyperglycemia 11/20/2015  . Hyperlipidemia, mixed 09/11/2007   Qualifier: Diagnosis of  By: Jerold Coombe   Did not tolerate Lipitor, zocor, Lovastatin, Pravastatin, Livalo, Crestor even low dose   . IBS (irritable bowel syndrome)   . Maxillary sinusitis   . Normal cardiac stress test 11/2011   No evidence of ischemia or infarct. Calculated ejection fraction 72%.  . Obesity   . OSA (obstructive sleep apnea) 02/2012   has stopped using  cpap  . Osteoarthritis   . Osteoporosis   . Pneumonia 11/2011   "before 2013 I hadn't had pneumonia since I was a child" (04/13/2012)  . Pulmonary nodules   .  S/P angioplasty with stent 08/23/17 ostial 2nd OM with DES synnergy 08/24/2017  . Schatzki's ring     Past Surgical History:  Procedure Laterality Date  . APPENDECTOMY  1989  . CARDIAC CATHETERIZATION N/A 11/25/2014   Procedure: Right/Left Heart Cath and Coronary Angiography;  Surgeon: Belva Crome, MD;  Location: [facility];  Service: Cardiovascular;  Laterality: N/A;  . CESAREAN SECTION  1985  . CORONARY ANGIOPLASTY WITH STENT PLACEMENT  08/23/2017  . CORONARY STENT INTERVENTION N/A 08/23/2017   Procedure: CORONARY STENT INTERVENTION;  Surgeon: Burnell Blanks, MD;  Location: [facility];  Service: Cardiovascular;  Laterality: N/A;  . HERNIA REPAIR   04/13/2012   VHR laparoscopic  . LEFT HEART CATH AND CORONARY ANGIOGRAPHY N/A 08/23/2017   Procedure: LEFT HEART CATH AND CORONARY ANGIOGRAPHY;  Surgeon: Burnell Blanks, MD;  Location: [facility];  Service: Cardiovascular;  Laterality: N/A;  . VENTRAL HERNIA REPAIR  04/13/2012   Procedure: LAPAROSCOPIC VENTRAL HERNIA;  Surgeon: Adin Hector, MD;  Location: [facility];  Service: General;  Laterality: N/A;  laparoscopic repair of incarcerated hernia    Current Medications: Prior to Admission medications   Medication Sig Start Date End Date Taking? Authorizing Provider  ALPRAZolam Duanne Moron) 0.5 MG tablet Take 1 tablet (0.5 mg total) by mouth at bedtime as needed for anxiety (Palpatations, SOB). 01/16/17   [name], DO  aspirin 81 MG tablet Take 81 mg by mouth daily.     [provider]  Calcium-Magnesium-Vitamin D 300-150-400 MG-MG-UNIT TABS Take 1 tablet by mouth daily.    [provider]  Cholecalciferol (VITAMIN D) 2000 UNITS CAPS Take 2,000 Units by mouth daily.     [provider]  clopidogrel (PLAVIX) 75 MG tablet Take 1 tablet (75 mg total) by mouth daily with breakfast. 08/24/17   Isaiah Serge, NP  cyclobenzaprine (FLEXERIL) 10 MG tablet Take 1 tablet (10 mg total) by mouth 3 (three) times daily as needed for muscle spasms. 05/03/16   Opalski, Neoma Laming, DO  Evolocumab (REPATHA SURECLICK) 099 MG/ML SOAJ Inject 1 pen into the skin every 14 (fourteen) days. 08/12/16   Dorothy Spark, MD  FASENRA 30 MG/ML SOSY Inject 1 Syringe into the muscle every 30 (thirty) days. 08/04/17   [provider]  fexofenadine (ALLEGRA) 180 MG tablet Take 180 mg by mouth daily as needed for allergies.     [provider]  furosemide (LASIX) 20 MG tablet Take 1 tablet by mouth daily for 2 days then take only as needed for swelling 08/21/17   Jenevie Casstevens, PA  guaiFENesin (MUCINEX) 600 MG 12 hr tablet Take 2 tablets (1,200 mg total) by mouth 2  (two) times daily as needed. Patient taking differently: Take 1,200 mg by mouth 2 (two) times daily as needed for cough.  06/10/16   Debbe Odea, MD  ipratropium-albuterol (DUONEB) 0.5-2.5 (3) MG/3ML SOLN INHALE THE CONTENTS OF 1 VIAL VIA NEBULIZER UP TO 4 TO 6 TIMES A DAY. Patient taking differently: INHALE THE CONTENTS OF 1 VIAL VIA NEBULIZER UP TO 4 TO 6 TIMES A DAY AS NEEDED 11/24/15   Brand Males, MD  Magnesium Oxide 250 MG TABS Take 250 mg by mouth daily.    [provider]  mometasone (ASMANEX 120 METERED DOSES) 220 MCG/INH inhaler Inhale 2 puffs into the lungs 2 (two) times daily. Patient taking differently: Inhale 1 puff into the lungs 2 (two) times daily.  01/16/17   Brand Males, MD  montelukast (SINGULAIR) 10 MG tablet TAKE 1 TABLET BY MOUTH ONCE DAILY 12/19/16   Brand Males, MD  nitroGLYCERIN (NITROSTAT) 0.4 MG SL tablet Place 1 tablet (0.4 mg total) under the tongue every 5 (five) minutes as needed for chest pain. 08/26/16   Dorothy Spark, MD  pantoprazole (PROTONIX) 40 MG tablet Take 1 tablet (40 mg total) by mouth daily. 08/24/17   Isaiah Serge, NP  tiotropium (SPIRIVA HANDIHALER) 18 MCG inhalation capsule INHALE THE CONTENTS OF ONE CAPSULE DAILY 01/16/17   Brand Males, MD  venlafaxine XR (EFFEXOR-XR) 150 MG 24 hr capsule Take 150 mg by mouth daily with breakfast.    [provider]  vitamin C (ASCORBIC ACID) 500 MG tablet Take 500 mg by mouth daily.    [provider]  zinc gluconate 50 MG tablet Take 50 mg by mouth daily.    [provider]    Allergies:   Beclomethasone dipropionate; Budesonide-formoterol fumarate; Mometasone furo-formoterol fum; Sulfonamide derivatives; Pulmicort [budesonide]; and Statins   Social History   Socioeconomic History  . Marital status: Married    Spouse name: Not on file  . Number of children: 1  . Years of education: Not on file  . Highest education level: Not on file  Occupational  History    Employer: UNEMPLOYED  Social Needs  . Financial resource strain: Not on file  . Food insecurity:    Worry: Not on file    Inability: Not on file  . Transportation needs:    Medical: Not on file    Non-medical: Not on file  Tobacco Use  . Smoking status: Never Smoker  . Smokeless tobacco: Never Used  Substance and Sexual Activity  . Alcohol use: Yes    Alcohol/week: 2.4 oz    Types: 2 Glasses of wine, 2 Cans of beer per week  . Drug use: No  . Sexual activity: Yes    Comment: gluten free, lives with husband and son with CP quadriplegia  Lifestyle  . Physical activity:    Days per week: Not on file    Minutes per session: Not on file  . Stress: Not on file  Relationships  . Social connections:    Talks on phone: Not on file    Gets together: Not on file    Attends religious service: Not on file    Active member of club or organization: Not on file    Attends meetings of clubs or organizations: Not on file    Relationship status: Not on file  Other Topics Concern  . Not on file  Social History Narrative   Cares for a 71yo son with cerebral palsy.      Family History:  The patient's family history includes Breast cancer in her mother and sister; Diabetes in her mother; Diverticulosis in her father; Heart attack in her maternal grandfather; Hypertension in her mother; Prostate cancer in her father; Pulmonary embolism in her brother.   ROS:   Please see the history of present illness.    ROS All other systems reviewed and are negative.   PHYSICAL EXAM:   VS:  BP 124/82   Pulse 76   Ht 5\' 2"  (1.575 m)   Wt 164 lb (74.4 kg)   SpO2 99%   BMI 30.00 kg/m    GEN: Well nourished, well developed, in no acute distress  HEENT: normal  Neck: no JVD, carotid bruits, or masses Cardiac: RRR; no murmurs, rubs, or gallops,no edema  Respiratory:  clear to auscultation bilaterally, normal work of breathing GI: soft, nontender, nondistended, + BS MS: no deformity or  atrophy  Skin: warm and dry, no rash Neuro:  Alert and Oriented x 3, Strength and sensation are intact Psych: euthymic mood, full affect  Wt Readings from Last 3 Encounters:  09/13/17 164 lb (74.4 kg)  08/24/17 160 lb 15 oz (73 kg)  07/04/17 160 lb 9.6 oz (72.8 kg)      Studies/Labs Reviewed:   EKG:  EKG is not ordered today.    Recent Labs: 04/12/2017: ALT 15; TSH 3.16 08/21/2017: NT-Pro BNP <5 08/24/2017: BUN 11; Creatinine, Ser 0.67; Hemoglobin 12.2; Platelets 254; Potassium 4.3; Sodium 139   Lipid Panel    Component Value Date/Time   CHOL 130 04/12/2017 0805   CHOL 227 (H) 07/25/2016 0822   TRIG 111.0 04/12/2017 0805   HDL 55.20 04/12/2017 0805   HDL 51 07/25/2016 0822   CHOLHDL 2 04/12/2017 0805   VLDL 22.2 04/12/2017 0805   LDLCALC 53 04/12/2017 0805   LDLCALC 160 (H) 07/25/2016 0822   LDLDIRECT 177.8 09/07/2011 0945    Additional studies/ records that were reviewed today include:   08/23/17  LEFT HEART CATH AND CORONARY ANGIOGRAPHY  Conclusion     Ost 2nd Mrg lesion is 95% stenosed.  A drug-eluting stent was successfully placed using a STENT SYNERGY DES 3X16.  Post intervention, there is a 0% residual stenosis.  The left ventricular systolic function is normal.  LV end diastolic pressure is normal.  The left ventricular ejection fraction is 55-65% by visual estimate.  There is no mitral valve regurgitation.  1. Severe single vessel CAD with severe stenosis in the ostium of the large obtuse marginal branch.  2. Successful PTCA/DES x 1 ostium of the OM. 3. 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  4. The LAD has no obstructive disease.  5. Normal LV systolic function  Recommendations: Will continue DAPT with ASA and Plavix for at least one year. Consider addition of a beta blocker or nitrate.        ASSESSMENT & PLAN:    1. CAD s/p DES to OM - no angina. Continue ASA and plavix.  Not on BB due to severe asthma. Advised to discuss with pulmonologist if okay to start low dose metoprolol vs bisoprolol.   2. HLD  - 04/12/2017: Cholesterol 130; HDL 55.20; LDL Cholesterol 53; Triglycerides 111.0; VLDL 22.2  - On Repatha  3. Chronic diastolic CHF - Euvolemic. Continue lasix.    Medication Adjustments/Labs and Tests Ordered: Current medicines are reviewed at length with the patient today.  Concerns regarding medicines are outlined above.  Medication changes, Labs and Tests ordered today are listed in the Patient Instructions below. Patient Instructions  Your physician recommends that you continue on your current medications as directed. Please refer to the Current Medication list given to you today.   Your physician recommends that you schedule a follow-up appointment in: AS Gwen Pounds, PA  09/13/2017 10:44 AM    [address], [place], [place]  [address] Phone: [phone]; Fax: [phone]

## 2017-09-13 ENCOUNTER — Encounter: Payer: Self-pay | Admitting: Physician Assistant

## 2017-09-13 ENCOUNTER — Ambulatory Visit: Payer: PPO | Admitting: Physician Assistant

## 2017-09-13 VITALS — BP 124/82 | HR 76 | Ht 62.0 in | Wt 164.0 lb

## 2017-09-13 DIAGNOSIS — I2510 Atherosclerotic heart disease of native coronary artery without angina pectoris: Secondary | ICD-10-CM

## 2017-09-13 DIAGNOSIS — E782 Mixed hyperlipidemia: Secondary | ICD-10-CM | POA: Diagnosis not present

## 2017-09-13 DIAGNOSIS — I5032 Chronic diastolic (congestive) heart failure: Secondary | ICD-10-CM | POA: Diagnosis not present

## 2017-09-13 NOTE — Patient Instructions (Signed)
Your physician recommends that you continue on your current medications as directed. Please refer to the Current Medication list given to you today.   Your physician recommends that you schedule a follow-up appointment in: AS SCHEDULED  

## 2017-09-14 NOTE — Telephone Encounter (Signed)
Pt. Mailed her [place] form, she forgot to bring it in when she came in for her shot. I filled out our portion, still needs to be signed by MR. He won't be back til Mon.(09/18/17).

## 2017-09-19 ENCOUNTER — Telehealth: Payer: Self-pay | Admitting: Internal Medicine

## 2017-09-19 ENCOUNTER — Encounter: Payer: Self-pay | Admitting: Internal Medicine

## 2017-09-19 NOTE — Telephone Encounter (Signed)
Pt called office and requested to speak with a supervisor. I called and spoke to pt. Pt is upset because her [name] was sent to [place] instead of going through pt assistance ([employer]). Pt is now in the doughnut hole and cannot afford her Spiriva. Spoke with MR and he gave the OK to supplement with Spiriva respimat 2.5 samples until pt can get pt assistance through [facility] for her Spiriva handihaler. Forms were placed up front for pt along with samples. Pt aware.   Regarding pt's [name] - I called [employer] and was advised we need to fax over the application, rx form, 2355 proof of income, and insurance card. A determination will be made in 3-5 business days of when the information has been received by [employer]. Alroy Bailiff has been informed of the status. The only thing we are waiting on now is the rx form to be faxed to [name] to complete then fax back to [employer] along with all the other information mentioned above. Pt is aware.  Will forward to [employer] to keep an eye out for the rx form via fax.

## 2017-09-19 NOTE — Telephone Encounter (Signed)
Please phone note from 09/19/17.

## 2017-09-21 NOTE — Telephone Encounter (Signed)
Everything has been faxed to [employer].

## 2017-09-22 NOTE — Telephone Encounter (Signed)
Routing message to TS to see if forms rec'd on 09/18/17 TS please advise

## 2017-09-25 ENCOUNTER — Other Ambulatory Visit: Payer: Self-pay

## 2017-09-25 DIAGNOSIS — M4125 Other idiopathic scoliosis, thoracolumbar region: Secondary | ICD-10-CM | POA: Diagnosis not present

## 2017-09-25 DIAGNOSIS — M4316 Spondylolisthesis, lumbar region: Secondary | ICD-10-CM | POA: Diagnosis not present

## 2017-09-25 DIAGNOSIS — R262 Difficulty in walking, not elsewhere classified: Secondary | ICD-10-CM | POA: Diagnosis not present

## 2017-09-25 DIAGNOSIS — M545 Low back pain: Secondary | ICD-10-CM | POA: Diagnosis not present

## 2017-09-25 NOTE — Telephone Encounter (Signed)
I didn't get the product request form so I called [name] back and asked them to send it to triage. [name], your helping me so much already I hate to ask, but will you fill out the form and fax it and a copy of her insurance card or cards, please. If you get a chance please help Victoria [name] if she needs it. I loaded her down. So Sorry.

## 2017-09-25 NOTE — Patient Outreach (Signed)
[place] [facility]) Care Management  09/25/2017  STEVANA DUFNER [birth year] [id number]   Telephone Screen  Referral Date: 09/22/17 Referral Source: Urgent HTA Concierge Referral Reason: "member needs help with injections, she is in the gap" Insurance: HTA   Outreach attempt # 1 to patient. Spoke with patient who voices she was headed out the door to go to [place] and could not talk at this time. Advised tat RN CM would call patient back at another time.      Plan: RN CM will make outreach attempt to patient within 3-4 business days.    Enzo Montgomery, RN,BSN,CCM [place] Management Telephonic Care Management Coordinator Direct Phone: [phone] Toll Free: [phone] Fax: [phone]

## 2017-09-25 NOTE — Telephone Encounter (Signed)
Forms were rec'd, filled out, signed and faxed. Nothing further needed.

## 2017-09-25 NOTE — Telephone Encounter (Signed)
Victoria [name] from [name] is calling back [phone] option #1  She is faxing over the  Product request form [name]

## 2017-09-26 ENCOUNTER — Other Ambulatory Visit: Payer: Self-pay

## 2017-09-26 NOTE — Patient Outreach (Signed)
[facility]) Care Management  09/26/2017  Victoria [name] 04/16/1956 [id number]     Telephone Screen  Referral Date: 09/22/17 Referral Source: Urgent HTA Concierge Referral Reason: "member needs help with injections, she is in the gap" Insurance: HTA   Outreach attempt #2 to patient. No answer at present.      Plan: RN CM will make outreach attempt to patient within 3-4 business days. RN CM will send unsuccessful outreach letter to patient.    Enzo Montgomery, RN,BSN,CCM [place] Management Telephonic Care Management Coordinator Direct Phone: [phone] Toll Free: [phone] Fax: [phone]

## 2017-09-28 ENCOUNTER — Other Ambulatory Visit: Payer: Self-pay

## 2017-09-28 NOTE — Patient Outreach (Signed)
[facility]) Care Management  09/28/2017  Victoria [name] 1955/10/08 [id number]     Telephone Screen  Referral Date:09/22/17 Referral Source:Urgent HTA Concierge Referral Reason:"member needs help with injections, she is in the gap" Insurance:HTA   Outreach attempt #3 to patient. No answer at present.     Plan: RN CM will close case if no response from letter mailed to patient.   Enzo Montgomery, RN,BSN,CCM [place] Management Telephonic Care Management Coordinator Direct Phone: [phone] Toll Free: [phone] Fax: [phone]

## 2017-10-03 NOTE — Telephone Encounter (Signed)
Faxed product request form to [employer].

## 2017-10-06 ENCOUNTER — Other Ambulatory Visit: Payer: Self-pay

## 2017-10-06 NOTE — Patient Outreach (Signed)
[facility]) Care Management  10/06/2017  Victoria [name] 01/20/1956 [id number]   Telephone Screen  Referral Date:09/22/17 Referral Source:Urgent HTA Concierge Referral Reason:"member needs help with injections, she is in the gap" Insurance:HTA     Multiple attempts to establish contact with patient without success. No response from letter mailed to patient. Case is being closed at this time.      Plan: RN CM will close case at this time.   Enzo Montgomery, RN,BSN,CCM [place] Management Telephonic Care Management Coordinator Direct Phone: [phone] Toll Free: [phone] Fax: [phone]

## 2017-10-10 NOTE — Telephone Encounter (Signed)
[employer] will fax over Product request form that needs to be completed entirely and returned ASAP and copy of Insurance card(s).

## 2017-10-11 NOTE — Telephone Encounter (Signed)
Product request form rec'd and completed. This has been faxed back to [employer]-will wait for a call to schedule shipment.   Faxed to: [phone].

## 2017-10-12 NOTE — Telephone Encounter (Signed)
[employer] [phone] opt. 1) states the patient has been approved for [name] for free until 06/19/2018; medication will be sent by [employer] and will be here within the next 3-5 business days.

## 2017-10-13 ENCOUNTER — Telehealth: Payer: Self-pay | Admitting: Internal Medicine

## 2017-10-13 NOTE — Telephone Encounter (Signed)
Called pt. To let her know her fasenra would be coming in sometime next wk.Benjamin had already called her yesterday afternoon. She has 2 appts. Here next Thurs. And was hoping to get her fasenra shot as well. That shouldn't  be a problem [employer] said 3 to 5 business days, that will be Thurs.. Meds are usually here by 10:00 or 11:00. I will put order in fasenra in smart text. Nothing further needed.

## 2017-10-17 ENCOUNTER — Telehealth: Payer: Self-pay | Admitting: Internal Medicine

## 2017-10-17 ENCOUNTER — Encounter: Payer: Self-pay | Admitting: *Deleted

## 2017-10-17 NOTE — Telephone Encounter (Signed)
Arrival Date: 10/17/2017 Lot #: [id number] Exp date: 09/2018

## 2017-10-17 NOTE — Telephone Encounter (Signed)
1 prefilled syringe Ordered date: 10/13/2017 Shipping date: 10/16/2017 Pharm. Wouldn't give [name] a definate ship date.

## 2017-10-17 NOTE — Telephone Encounter (Signed)
Created in error

## 2017-10-19 ENCOUNTER — Ambulatory Visit (INDEPENDENT_AMBULATORY_CARE_PROVIDER_SITE_OTHER): Payer: PPO

## 2017-10-19 ENCOUNTER — Ambulatory Visit (INDEPENDENT_AMBULATORY_CARE_PROVIDER_SITE_OTHER): Payer: PPO | Admitting: Internal Medicine

## 2017-10-19 ENCOUNTER — Ambulatory Visit: Payer: PPO | Admitting: Internal Medicine

## 2017-10-19 ENCOUNTER — Encounter: Payer: Self-pay | Admitting: Internal Medicine

## 2017-10-19 ENCOUNTER — Telehealth: Payer: Self-pay | Admitting: Internal Medicine

## 2017-10-19 VITALS — BP 118/74 | HR 77 | Ht 62.0 in | Wt 162.9 lb

## 2017-10-19 DIAGNOSIS — J4550 Severe persistent asthma, uncomplicated: Secondary | ICD-10-CM

## 2017-10-19 DIAGNOSIS — J8283 Eosinophilic asthma: Secondary | ICD-10-CM

## 2017-10-19 DIAGNOSIS — J82 Pulmonary eosinophilia, not elsewhere classified: Secondary | ICD-10-CM

## 2017-10-19 DIAGNOSIS — B4481 Allergic bronchopulmonary aspergillosis: Secondary | ICD-10-CM

## 2017-10-19 LAB — PULMONARY FUNCTION TEST
DL/VA % PRED: 104 %
DL/VA: 4.75 ml/min/mmHg/L
DLCO unc % pred: 90 %
DLCO unc: 19.51 ml/min/mmHg
FEF 25-75 Post: 1.09 L/sec
FEF 25-75 Pre: 1.07 L/sec
FEF2575-%Change-Post: 1 %
FEF2575-%PRED-POST: 48 %
FEF2575-%Pred-Pre: 48 %
FEV1-%Change-Post: 0 %
FEV1-%PRED-PRE: 75 %
FEV1-%Pred-Post: 76 %
FEV1-PRE: 1.79 L
FEV1-Post: 1.8 L
FEV1FVC-%CHANGE-POST: 4 %
FEV1FVC-%PRED-PRE: 86 %
FEV6-%Change-Post: -3 %
FEV6-%Pred-Post: 87 %
FEV6-%Pred-Pre: 90 %
FEV6-Post: 2.57 L
FEV6-Pre: 2.65 L
FEV6FVC-%Pred-Post: 104 %
FEV6FVC-%Pred-Pre: 104 %
FVC-%Change-Post: -3 %
FVC-%PRED-POST: 84 %
FVC-%PRED-PRE: 86 %
FVC-POST: 2.57 L
FVC-PRE: 2.65 L
POST FEV1/FVC RATIO: 70 %
PRE FEV1/FVC RATIO: 67 %
Post FEV6/FVC ratio: 100 %
Pre FEV6/FVC Ratio: 100 %
RV % pred: 118 %
RV: 2.26 L
TLC % pred: 103 %
TLC: 4.9 L

## 2017-10-19 LAB — POCT EXHALED NITRIC OXIDE: FeNO level (ppb): 58

## 2017-10-19 MED ORDER — MOMETASONE FUROATE 220 MCG/INH IN AEPB: 2.0000 | INHALATION_SPRAY | Freq: Two times a day (BID) | RESPIRATORY_TRACT | 12 refills | Status: DC

## 2017-10-19 NOTE — Telephone Encounter (Signed)
Dear [name], thank you for your message. I think she will be better without betablockers given her severe asthma. Victoria [name]

## 2017-10-19 NOTE — Progress Notes (Signed)
Subjective:     Patient ID: Victoria [name], female   DOB: 08-28-1955, 62 y.o.   MRN: [id number]  HPI     OV 10/06/2016  Chief Complaint  Patient presents with  . Follow-up    Pt states her asthma is good; had one flare up since last visit. SOB with exertion, cough with yellow to brown mucus prodcution, with frequent wheezing. Notice improvement with being on [employer]      62 year old female with severe asthma with ABPA and elevated IgE and eosinophilia. She is on IL5RAb antibody treatment now for the last few months. She feels this is helping her significantly. Her symptoms course of better. She is on Asmanex, Spiriva, Singulair as well. She is not on daily prednisone anymore. In December 2017 should she did grow MRSA from sputum culture. Currently a few days ago she developed sore throat and having some sputum production. She is worried this might be MRSA. Also asthma itself is stable and well controlled according to history. She is wondering about getting a sputum culture and really wishes for it    OV 01/11/2017  Chief Complaint  Patient presents with  . Follow-up    Pt states her breathing is doing well. Pt states the last couple days she has had more wheezing. Pt states she has noticed an improvement being on Cinqair.    62 year old female with severe asthma with ABPA and elevated IgE and eosinophilia. She is on IL5RAb antibody treatment.  She is on Asmanex, Spiriva, Singulair as well. She is not on daily prednisone anymore   01/11/2017 - Here for routine follow-up. She is on interleukin-5 receptor antibody IV infusion therapy. This was held a few weeks ago because of asthma exacerbation. She says approximately on Father's Day 2018 he started feeling unwell and then ended up with an exacerbation for which she needed antibiotics and prednisone. She is surprised by this decompensation despite being on this biologic immunotherapy for asthma. She is compliant with her Asmanex, Spiriva and  Singulair. She is not on prednisone. Currently she feels baseline. Patient infusion #6 or 7 is due for tomorrow [interleukin-5 receptor antibody). Overall she feels improved quality of life. Despite all this exhaled nitric oxide continues to be HIGH; unclear why   OV 04/21/2017  Chief Complaint  Patient presents with  . Follow-up    Pt states that she has been doing good. Has had no flare-ups with her asthma. Mild coughing at night. Denies any SOB or CP.  severe asthma with ABPA and elevated IgE and eosinophilia. She is on IL5RAb antibody treatment.  She is on Asmanex, Spiriva, Singulair as well. She is not on daily prednisone anymore  Continues cinqair spiriva, singulair, and asmanex. At this point in time the interleukin-5 receptor antibody is working really well for her. For nitric oxide today is lowest ever.. In terms of his symptoms she hardly ever gets up at night. When she wakes up she does not have any symptoms. She only has slight limitation with physical activity. She's not shortness of breath because of asthma not wheezing and not using albuterol for rescue use. The asthma control score is 0.8. However she is extremely frustrated by the [facility] cycle management of her interleukin-5 receptor antibody infusions. She is getting different bills wrong bills missed bills. Therefore she wants to switch to subcutaneous injectable therapy. She is very upset about the quality of care that she has received at the infusion center because of billing practices.  OV 10/19/2017  Chief Complaint  Patient presents with  . Follow-up    PFT done today. Pt states she has been doing well. Has had a little more coughing and wheezing recently and thinks it could be due to the weather. Pt just got started back on Fasenra injections.   Severe persistent asthma with ABPA elevated IgE and eosinophilia. S/P cinqair interleukin-5 receptor antibody treatment ending" 2018 with good response but  stopped secondary to billing errors at [facility] infusion center. Based on medical therapy then switched to anti-eosinophil interleukin-5 receptor antibody subcutaneous injection treatment Fasenra early 2019. She is also on baseline Asmanex, Spiriva, Singulair and antihistamine 8. She is not on daily prednisone anymore.  Last seen November 2018. Overall she was doing well with good effort tolerance but in March 2019 had some atypical chest painand ended up with a coronary artery stent in the abuse marginal. She is improved from that. She is wondering about taking a beta blocker safely. She does not feel that his anemia because of blood pressure is low but apparently cardiology is asked her to clear this with me. In the interim her IV injection interleukin-5 receptor antibody has been changed to subcutaneous injection FASENRA . Because of her cardiac issues she's only taken 3 doses since January 2019. She hopes to go back on schedule with this. She had billing issues at the [facility] infusion center with the IV infusion and that is why we switched to the subcutaneous injection. However, it looks like the workflow authorization for this was not done correctly and she isn't of the large co-pay. She is unhappy about this and routinely discussed this with the patient. In terms of asthma she feels her cough is decompensated in the last few weeks and this correlates with her not taking her Asmanex. Her lung function test is near normal today. Her exhaled nitric oxide is slightly elevated than the lowest that she's had but still better than her baseline average. Currently she is waking up at nightcoughing because of the spring pollen but only hardly. Very mild symptoms when she wakes up she is slightly limited in the daytime and she experiences shortness of breath little of the time not using albuterol for rescue.   Results for CAMAY, PEDIGO (MRN [id number]) as of 04/21/2017 10:32  Ref. Range 05/05/2015 13:45  07/17/2015 12:31 11/24/2015 12:31 10/06/2016 12:00 01/11/2017 12:54 04/21/2017  10/19/2017   Nitric Oxide Unknown 80 80 126 74 87 42 [id number]    Results for AREYANA, LEONI (MRN [id number]) as of 10/19/2017 11:51  Ref. Range 10/19/2017 10:47  FEV1-Post Latest Units: L 1.80  FEV1-%Pred-Post Latest Units: % 76  FEV1-%Change-Post Latest Units: % 0  Results for MCKYLA, DECKMAN (MRN [id number]) as of 10/19/2017 11:51  Ref. Range 10/19/2017 10:47  TLC Latest Units: L 4.90  TLC % pred Latest Units: % 103  Results for CHARLOTTIE, PERAGINE (MRN [id number]) as of 10/19/2017 11:51  Ref. Range 10/19/2017 10:47  DLCO unc Latest Units: ml/min/mmHg 19.51  DLCO unc % pred Latest Units: % 90    Allergies  Allergen Reactions  . Beclomethasone Dipropionate Hives and Other (See Comments)     weight gain  . Budesonide-Formoterol Fumarate Hives  . Mometasone Furo-Formoterol Fum Hives and Other (See Comments)    weight gain  . Sulfonamide Derivatives Hives and Rash  . Pulmicort [Budesonide]     Headaches   . 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), Anemia, Anxiety, Asthma, CAD (coronary artery disease), CAP (community acquired pneumonia) (2016; 06/07/2016), Chronic bronchitis ([place]), Chronic lower back pain, Complication of anesthesia, COPD (chronic obstructive pulmonary disease) ([place]), Depression, Diverticulosis, GERD (gastroesophageal reflux disease), H/O hiatal hernia, Headache, History of echocardiogram, Hyperglycemia (11/20/2015), Hyperlipidemia, mixed (09/11/2007), IBS (irritable bowel syndrome), Maxillary sinusitis, Normal cardiac stress test (11/2011), Obesity, OSA (obstructive sleep apnea) (02/2012), Osteoarthritis, Osteoporosis, Pneumonia (11/2011), Pulmonary nodules, S/P angioplasty with stent 08/23/17 ostial 2nd OM with DES synnergy (08/24/2017), and Schatzki's ring.   reports that she has never smoked. She has never used smokeless tobacco.  Past Surgical  History:  Procedure Laterality Date  . APPENDECTOMY  1989  . CARDIAC CATHETERIZATION N/A 11/25/2014   Procedure: Right/Left Heart Cath and Coronary Angiography;  Surgeon: Belva Crome, MD;  Location: [facility];  Service: Cardiovascular;  Laterality: N/A;  . CESAREAN SECTION  1985  . CORONARY ANGIOPLASTY WITH STENT PLACEMENT  08/23/2017  . CORONARY STENT INTERVENTION N/A 08/23/2017   Procedure: CORONARY STENT INTERVENTION;  Surgeon: Burnell Blanks, MD;  Location: [facility];  Service: Cardiovascular;  Laterality: N/A;  . HERNIA REPAIR  04/13/2012   VHR laparoscopic  . LEFT HEART CATH AND CORONARY ANGIOGRAPHY N/A 08/23/2017   Procedure: LEFT HEART CATH AND CORONARY ANGIOGRAPHY;  Surgeon: Burnell Blanks, MD;  Location: [facility];  Service: Cardiovascular;  Laterality: N/A;  . VENTRAL HERNIA REPAIR  04/13/2012   Procedure: LAPAROSCOPIC VENTRAL HERNIA;  Surgeon: Adin Hector, MD;  Location: [facility];  Service: General;  Laterality: N/A;  laparoscopic repair of incarcerated hernia    Allergies  Allergen Reactions  . Beclomethasone Dipropionate Hives and Other (See Comments)     weight gain  . Budesonide-Formoterol Fumarate Hives  . Mometasone Furo-Formoterol Fum Hives and Other (See Comments)    weight gain  . Sulfonamide Derivatives Hives and Rash  . Pulmicort [Budesonide]     Headaches   . Statins     Myalgias, RLS    Immunization History  Administered Date(s) Administered  . Influenza Split 04/20/2011  . Influenza Whole 06/06/2007, 04/15/2008, 04/02/2009, 03/29/2012  . Influenza, High Dose Seasonal PF 05/05/2015  . Influenza,inj,Quad PF,6+ Mos 05/09/2013, 03/03/2014, 05/03/2016, 04/21/2017  . Pneumococcal Conjugate-13 05/09/2013  . Pneumococcal Polysaccharide-23 05/04/2005  . Td 07/29/2009  . Tdap 03/11/2015    Family History  Problem Relation Age of Onset  . Breast cancer Mother   . Hypertension Mother   . Diabetes Mother   .  Diverticulosis Father   . Prostate cancer Father   . Pulmonary embolism Brother        recurrent  . Heart attack Maternal Grandfather   . Breast cancer Sister   . Stroke Neg Hx      Current Outpatient Medications:  .  ALPRAZolam (XANAX) 0.5 MG tablet, Take 1 tablet (0.5 mg total) by mouth at bedtime as needed for anxiety (Palpatations, SOB)., Disp: 30 tablet, Rfl: 0 .  aspirin 81 MG tablet, Take 81 mg by mouth daily. , Disp: , Rfl:  .  Calcium-Magnesium-Vitamin D 300-150-400 MG-MG-UNIT TABS, Take 1 tablet by mouth daily., Disp: , Rfl:  .  Cholecalciferol (VITAMIN D) 2000 UNITS CAPS, Take 2,000 Units by mouth daily. , Disp: , Rfl:  .  clopidogrel (PLAVIX) 75 MG tablet, Take 1 tablet (75 mg total) by mouth daily with breakfast., Disp: 30 tablet, Rfl: 6 .  Evolocumab (REPATHA SURECLICK) 416 MG/ML SOAJ,  Inject 1 pen into the skin every 14 (fourteen) days., Disp: 2 pen, Rfl: 11 .  FASENRA 30 MG/ML SOSY, Inject 1 Syringe into the muscle every 30 (thirty) days., Disp: , Rfl:  .  fexofenadine (ALLEGRA) 180 MG tablet, Take 180 mg by mouth daily as needed for allergies. , Disp: , Rfl:  .  furosemide (LASIX) 20 MG tablet, Take 1 tablet by mouth daily for 2 days then take only as needed for swelling, Disp: 90 tablet, Rfl: 3 .  guaiFENesin (MUCINEX) 600 MG 12 hr tablet, Take 2 tablets (1,200 mg total) by mouth 2 (two) times daily as needed. (Patient taking differently: Take 1,200 mg by mouth 2 (two) times daily as needed for cough. ), Disp: 30 tablet, Rfl: 1 .  ipratropium-albuterol (DUONEB) 0.5-2.5 (3) MG/3ML SOLN, INHALE THE CONTENTS OF 1 VIAL VIA NEBULIZER UP TO 4 TO 6 TIMES A DAY., Disp: 360 mL, Rfl: 2 .  Magnesium Oxide 250 MG TABS, Take 250 mg by mouth daily., Disp: , Rfl:  .  mometasone (ASMANEX 120 METERED DOSES) 220 MCG/INH inhaler, Inhale 2 puffs into the lungs 2 (two) times daily., Disp: 1 Inhaler, Rfl: 12 .  montelukast (SINGULAIR) 10 MG tablet, TAKE 1 TABLET BY MOUTH ONCE DAILY, Disp: 90  tablet, Rfl: 5 .  nitroGLYCERIN (NITROSTAT) 0.4 MG SL tablet, Place 1 tablet (0.4 mg total) under the tongue every 5 (five) minutes as needed for chest pain., Disp: 25 tablet, Rfl: 11 .  tiotropium (SPIRIVA HANDIHALER) 18 MCG inhalation capsule, INHALE THE CONTENTS OF ONE CAPSULE DAILY, Disp: 90 capsule, Rfl: 3 .  venlafaxine XR (EFFEXOR-XR) 150 MG 24 hr capsule, Take 150 mg by mouth daily with breakfast., Disp: , Rfl:  .  vitamin C (ASCORBIC ACID) 500 MG tablet, Take 500 mg by mouth daily., Disp: , Rfl:  .  zinc gluconate 50 MG tablet, Take 50 mg by mouth daily., Disp: , Rfl:     Review of Systems     Objective:   Physical Exam  Constitutional: She is oriented to person, place, and time. She appears well-developed and well-nourished. No distress.  HENT:  Head: Normocephalic and atraumatic.  Right Ear: External ear normal.  Left Ear: External ear normal.  Mouth/Throat: Oropharynx is clear and moist. No oropharyngeal exudate.  Eyes: Pupils are equal, round, and reactive to light. Conjunctivae and EOM are normal. Right eye exhibits no discharge. Left eye exhibits no discharge. No scleral icterus.  Neck: Normal range of motion. Neck supple. No JVD present. No tracheal deviation present. No thyromegaly present.  Cardiovascular: Normal rate, regular rhythm, normal heart sounds and intact distal pulses. Exam reveals no gallop and no friction rub.  No murmur heard. Pulmonary/Chest: Effort normal and breath sounds normal. No respiratory distress. She has no wheezes. She has no rales. She exhibits no tenderness.  Abdominal: Soft. Bowel sounds are normal. She exhibits no distension and no mass. There is no tenderness. There is no rebound and no guarding.  Musculoskeletal: Normal range of motion. She exhibits no edema or tenderness.  Lymphadenopathy:    She has no cervical adenopathy.  Neurological: She is alert and oriented to person, place, and time. She has normal reflexes. No cranial nerve  deficit. She exhibits normal muscle tone. Coordination normal.  Skin: Skin is warm and dry. No rash noted. She is not diaphoretic. No erythema. No pallor.  Psychiatric: She has a normal mood and affect. Her behavior is normal. Judgment and thought content normal.  Vitals reviewed.  There were no vitals filed for this visit.  Estimated body mass index is 30 kg/m as calculated from the following:   Height as of 09/13/17: 5\' 2"  (1.575 m).   Weight as of 09/13/17: 164 lb (74.4 kg).     Assessment:       ICD-10-CM   1. Severe persistent asthma with intensive monitoring J45.50 POCT EXHALED NITRIC OXIDE  2. 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[place]) W26   3. A B P A-ALLERGIC BRONCHOPULMONARY ASPERGILLOSIS B44.81        Plan:      Mild decompensation due to pollen and lack of asmanex  plan Let us aim for compliance with [facility] given setbacks with authorization workflow, and heart issues Will message Dr Ottie Glazier about beta blocker - low dose, highly beta 1 specific can be tried on time limited trial bsasis but with lot of monitoring Restart asmanext 2 puff twice daily - take samples and script Continue fasenra, spiriva, singulair and other medications If above not helping call for steroid burst   Followup  3-6 months or sooner if neede  - feno at followup    > 50% of this > 25 min visit spent in face to face counseling or coordination of care   Dr. Brand Males, M.D., [facility].C.P Pulmonary and Critical Care Medicine Staff Physician, [place] Director - Interstitial Lung Disease  Program  Pulmonary [place] at [place], [place], [address]  Pager: [phone], If no answer or between  15:00h - 7:00h: call [phone] Telephone: [phone]

## 2017-10-19 NOTE — Patient Instructions (Addendum)
ICD-10-CM   1. Severe persistent asthma with intensive monitoring J45.50 POCT EXHALED NITRIC OXIDE  2. 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[place]) W10   3. A B P A-ALLERGIC BRONCHOPULMONARY ASPERGILLOSIS B44.81     Mild decompensation due to pollen and lack of asmanex  plan Let us aim for compliance with [facility] given setbacks with authorization workflow, and heart issues Will message Dr Ottie Glazier about beta blocker - low dose, highly beta 1 specific can be tried on time limited trial bsasis but with lot of monitoring Restart asmanext 2 puff twice daily - take samples and script Continue fasenra, spiriva, singulair and other medications If above not helping call for steroid burst   Followup  3-6 months or sooner if neede  - feno at followup

## 2017-10-19 NOTE — Telephone Encounter (Signed)
Ok thanks much. Apprciate it. Will close note. Reply not needed

## 2017-10-19 NOTE — Telephone Encounter (Signed)
Dear Dr. Meda Coffee,  Victoria [name]  Patient tells me that because of her coronary artery disease that you have recommended beta blocker. And that he might need clearance from me. I think it is okay to take an extremely low-dose of beta 1 specific beta blocker such as bisoprolol and give a time limited trial of  intensive monitoringto ensure stability. She does have an extremely brittle asthma that after many years to started improving because of the new biologic injection treatments available in the market. However if you think she does not need a beta-1 specific beta blocker then no need to start    Dr. Brand Males, M.D., [facility].C.P Pulmonary and Critical Care Medicine Staff Physician, [place] Director - Interstitial Lung Disease  Program  Pulmonary [place] at [place], [place], [address]  Pager: [phone], If no answer or between  15:00h - 7:00h: call [phone] Telephone: [phone]

## 2017-10-19 NOTE — Progress Notes (Signed)
PFT completed 10/19/17

## 2017-10-20 MED ORDER — BENRALIZUMAB 30 MG/ML ~~LOC~~ SOSY
30.0000 mg | PREFILLED_SYRINGE | Freq: Once | SUBCUTANEOUS | Status: AC
  Administered 2017-10-19: 30 mg via SUBCUTANEOUS

## 2017-11-14 ENCOUNTER — Encounter: Payer: Self-pay | Admitting: Family Medicine

## 2017-11-14 ENCOUNTER — Ambulatory Visit (INDEPENDENT_AMBULATORY_CARE_PROVIDER_SITE_OTHER): Payer: PPO | Admitting: Family Medicine

## 2017-11-14 VITALS — BP 113/74 | HR 91 | Temp 98.7°F | Resp 16 | Ht 61.81 in | Wt 161.6 lb

## 2017-11-14 DIAGNOSIS — Z22322 Carrier or suspected carrier of Methicillin resistant Staphylococcus aureus: Secondary | ICD-10-CM

## 2017-11-14 DIAGNOSIS — R739 Hyperglycemia, unspecified: Secondary | ICD-10-CM | POA: Diagnosis not present

## 2017-11-14 DIAGNOSIS — I2510 Atherosclerotic heart disease of native coronary artery without angina pectoris: Secondary | ICD-10-CM | POA: Diagnosis not present

## 2017-11-14 DIAGNOSIS — E782 Mixed hyperlipidemia: Secondary | ICD-10-CM

## 2017-11-14 NOTE — Progress Notes (Signed)
Subjective:  I acted as a Education administrator for Dr. Charlett Blake. [place], [place]  Patient ID: Victoria [name], female    DOB: 1955/08/26, 62 y.o.   MRN: [id number]  Chief Complaint  Patient presents with  . Medication Refill    HPI  Patient is in today for a medication follow up and she feels well today but she did undergo a cardiac cath in March with stent of circumflex artery. She has not any further chest discomfort since then. She also had a skin infection she was told it was a MRSA abscess. It has resolved now. No recent febrile illness. Denies CP/palp/SOB/HA/congestion/fevers/GI or GU c/o. Taking meds as prescribed  Patient Care Team: Mosie Lukes, MD as PCP - General (Family Medicine) Dorothy Spark, MD as PCP - Cardiology (Cardiology) [place], Jennefer Bravo, MD as Referring Physician (Dermatology) Brand Males, MD as Consulting Physician (Pulmonary Disease) Dorothy Spark, MD as Consulting Physician (Cardiology) Dian Queen, MD as Consulting Physician (Obstetrics and Gynecology) Melissa Montane, MD as Consulting Physician (Otolaryngology) Pyrtle, Lajuan Lines, MD as Consulting Physician (Gastroenterology)   Past Medical History:  Diagnosis Date  . Allergic bronchopulmonary aspergillosis ([place]) 2008   sees Dr Edmund Hilda pulmonology  . Anemia    iron deficiency, resolved  . Anxiety   . Asthma   . CAD (coronary artery disease)    a. LHC 6/16:  oOM1 60, pRCA 25 >> med Rx b. cath 3/19 2nd OM with 95% stenosis s/p synergy DES & anomalous RCA  . CAP (community acquired pneumonia) 2016; 06/07/2016  . Chronic bronchitis ([place])   . Chronic lower back pain   . Complication of anesthesia    "think I have a hard time waking up from it"  . COPD (chronic obstructive pulmonary disease) ([place])   . Depression    mild  . Diverticulosis   . GERD (gastroesophageal reflux disease)   . H/O hiatal hernia   . Headache    "weekly" (08/23/2017)  . History of echocardiogram    Echo 6/16:  Mod  LVH, EF 60-65%, no RWMA, Gr 1 DD, trivial MR, normal LA size.  Marland Kitchen Hyperglycemia 11/20/2015  . Hyperlipidemia, mixed 09/11/2007   Qualifier: Diagnosis of  By: Jerold Coombe   Did not tolerate Lipitor, zocor, Lovastatin, Pravastatin, Livalo, Crestor even low dose   . IBS (irritable bowel syndrome)   . Maxillary sinusitis   . Normal cardiac stress test 11/2011   No evidence of ischemia or infarct. Calculated ejection fraction 72%.  . Obesity   . OSA (obstructive sleep apnea) 02/2012   has stopped using  cpap  . Osteoarthritis   . Osteoporosis   . Pneumonia 11/2011   "before 2013 I hadn't had pneumonia since I was a child" (04/13/2012)  . Pulmonary nodules   . S/P angioplasty with stent 08/23/17 ostial 2nd OM with DES synnergy 08/24/2017  . Schatzki's ring     Past Surgical History:  Procedure Laterality Date  . APPENDECTOMY  1989  . CARDIAC CATHETERIZATION N/A 11/25/2014   Procedure: Right/Left Heart Cath and Coronary Angiography;  Surgeon: Belva Crome, MD;  Location: [facility];  Service: Cardiovascular;  Laterality: N/A;  . CESAREAN SECTION  1985  . CORONARY ANGIOPLASTY WITH STENT PLACEMENT  08/23/2017  . CORONARY STENT INTERVENTION N/A 08/23/2017   Procedure: CORONARY STENT INTERVENTION;  Surgeon: Burnell Blanks, MD;  Location: [facility];  Service: Cardiovascular;  Laterality: N/A;  . HERNIA REPAIR  04/13/2012  VHR laparoscopic  . LEFT HEART CATH AND CORONARY ANGIOGRAPHY N/A 08/23/2017   Procedure: LEFT HEART CATH AND CORONARY ANGIOGRAPHY;  Surgeon: Burnell Blanks, MD;  Location: [name];  Service: Cardiovascular;  Laterality: N/A;  . VENTRAL HERNIA REPAIR  04/13/2012   Procedure: LAPAROSCOPIC VENTRAL HERNIA;  Surgeon: Adin Hector, MD;  Location: [facility];  Service: General;  Laterality: N/A;  laparoscopic repair of incarcerated hernia    Family History  Problem Relation Age of Onset  . Breast cancer Mother   . Hypertension Mother   .  Diabetes Mother   . Diverticulosis Father   . Prostate cancer Father   . Pulmonary embolism Brother        recurrent  . Heart attack Maternal Grandfather   . Breast cancer Sister   . Stroke Neg Hx     Social History   Socioeconomic History  . Marital status: Married    Spouse name: Not on file  . Number of children: 1  . Years of education: Not on file  . Highest education level: Not on file  Occupational History    Employer: UNEMPLOYED  Social Needs  . Financial resource strain: Not on file  . Food insecurity:    Worry: Not on file    Inability: Not on file  . Transportation needs:    Medical: Not on file    Non-medical: Not on file  Tobacco Use  . Smoking status: Never Smoker  . Smokeless tobacco: Never Used  Substance and Sexual Activity  . Alcohol use: Yes    Alcohol/week: 2.4 oz    Types: 2 Glasses of wine, 2 Cans of beer per week  . Drug use: No  . Sexual activity: Yes    Comment: gluten free, lives with husband and son with CP quadriplegia  Lifestyle  . Physical activity:    Days per week: Not on file    Minutes per session: Not on file  . Stress: Not on file  Relationships  . Social connections:    Talks on phone: Not on file    Gets together: Not on file    Attends religious service: Not on file    Active member of club or organization: Not on file    Attends meetings of clubs or organizations: Not on file    Relationship status: Not on file  . Intimate partner violence:    Fear of current or ex partner: Not on file    Emotionally abused: Not on file    Physically abused: Not on file    Forced sexual activity: Not on file  Other Topics Concern  . Not on file  Social History Narrative   Cares for a 53yo son with cerebral palsy.     Outpatient Medications Prior to Visit  Medication Sig Dispense Refill  . ALPRAZolam (XANAX) 0.5 MG tablet Take 1 tablet (0.5 mg total) by mouth at bedtime as needed for anxiety (Palpatations, SOB). 30 tablet 0  .  aspirin 81 MG tablet Take 81 mg by mouth daily.     . Calcium-Magnesium-Vitamin D 300-150-400 MG-MG-UNIT TABS Take 1 tablet by mouth daily.    . Cholecalciferol (VITAMIN D) 2000 UNITS CAPS Take 2,000 Units by mouth daily.     . clopidogrel (PLAVIX) 75 MG tablet Take 1 tablet (75 mg total) by mouth daily with breakfast. 30 tablet 6  . Evolocumab (REPATHA SURECLICK) 269 MG/ML SOAJ Inject 1 pen into the skin every 14 (fourteen) days. 2  pen 11  . FASENRA 30 MG/ML SOSY Inject 1 Syringe into the muscle every 30 (thirty) days.    . fexofenadine (ALLEGRA) 180 MG tablet Take 180 mg by mouth daily as needed for allergies.     . furosemide (LASIX) 20 MG tablet Take 1 tablet by mouth daily for 2 days then take only as needed for swelling 90 tablet 3  . guaiFENesin (MUCINEX) 600 MG 12 hr tablet Take 2 tablets (1,200 mg total) by mouth 2 (two) times daily as needed. (Patient taking differently: Take 1,200 mg by mouth 2 (two) times daily as needed for cough. ) 30 tablet 1  . ipratropium-albuterol (DUONEB) 0.5-2.5 (3) MG/3ML SOLN INHALE THE CONTENTS OF 1 VIAL VIA NEBULIZER UP TO 4 TO 6 TIMES A DAY. 360 mL 2  . Magnesium Oxide 250 MG TABS Take 250 mg by mouth daily.    . mometasone (ASMANEX 120 METERED DOSES) 220 MCG/INH inhaler Inhale 2 puffs into the lungs 2 (two) times daily. 1 Inhaler 12  . mometasone (ASMANEX 60 METERED DOSES) 220 MCG/INH inhaler Inhale 2 puffs into the lungs 2 (two) times daily. 1 Inhaler 12  . montelukast (SINGULAIR) 10 MG tablet TAKE 1 TABLET BY MOUTH ONCE DAILY 90 tablet 5  . nitroGLYCERIN (NITROSTAT) 0.4 MG SL tablet Place 1 tablet (0.4 mg total) under the tongue every 5 (five) minutes as needed for chest pain. 25 tablet 11  . tiotropium (SPIRIVA HANDIHALER) 18 MCG inhalation capsule INHALE THE CONTENTS OF ONE CAPSULE DAILY 90 capsule 3  . venlafaxine XR (EFFEXOR-XR) 150 MG 24 hr capsule Take 150 mg by mouth daily with breakfast.    . zinc gluconate 50 MG tablet Take 50 mg by mouth daily.     . vitamin C (ASCORBIC ACID) 500 MG tablet Take 500 mg by mouth daily.     No facility-administered medications prior to visit.     Allergies  Allergen Reactions  . Beclomethasone Dipropionate Hives and Other (See Comments)     weight gain  . Budesonide-Formoterol Fumarate Hives  . Mometasone Furo-Formoterol Fum Hives and Other (See Comments)    weight gain  . Sulfonamide Derivatives Hives and Rash  . Pulmicort [Budesonide]     Headaches   . Statins     Myalgias, RLS    Review of Systems  Constitutional: Negative for fever and malaise/fatigue.  HENT: Negative for congestion.   Eyes: Negative for blurred vision.  Respiratory: Negative for shortness of breath.   Cardiovascular: Negative for chest pain, palpitations and leg swelling.  Gastrointestinal: Negative for abdominal pain, blood in stool and nausea.  Genitourinary: Negative for dysuria and frequency.  Musculoskeletal: Negative for falls.  Skin: Negative for rash.  Neurological: Negative for dizziness, loss of consciousness and headaches.  Endo/Heme/Allergies: Negative for environmental allergies.  Psychiatric/Behavioral: Negative for depression. The patient is not nervous/anxious.        Objective:    Physical Exam  Constitutional: No distress.  HENT:  Left Ear: External ear normal.  Mouth/Throat: No oropharyngeal exudate.  Eyes: EOM are normal. Left eye exhibits no discharge. No scleral icterus.  Neck: No JVD present. No tracheal deviation present.  Cardiovascular: Normal heart sounds and intact distal pulses.  Pulmonary/Chest: No respiratory distress. She has no rales.  Abdominal: She exhibits no distension and no mass. There is no tenderness. There is no guarding.  Musculoskeletal: She exhibits no edema or tenderness.  Lymphadenopathy:    She has no cervical adenopathy.  Skin: No rash noted. No  erythema.    BP 113/74 (BP Location: Left Arm, Patient Position: Sitting, Cuff Size: Normal)   Pulse 91    Temp 98.7 F (37.1 C) (Oral)   Resp 16   Ht 5' 1.81" (1.57 m)   Wt 161 lb 9.6 oz (73.3 kg)   SpO2 100%   BMI 29.74 kg/m  Wt Readings from Last 3 Encounters:  11/14/17 161 lb 9.6 oz (73.3 kg)  10/19/17 162 lb 14.4 oz (73.9 kg)  09/13/17 164 lb (74.4 kg)   BP Readings from Last 3 Encounters:  11/14/17 113/74  10/19/17 118/74  09/13/17 124/82     Immunization History  Administered Date(s) Administered  . Influenza Split 04/20/2011  . Influenza Whole 06/06/2007, 04/15/2008, 04/02/2009, 03/29/2012  . Influenza, High Dose Seasonal PF 05/05/2015  . Influenza,inj,Quad PF,6+ Mos 05/09/2013, 03/03/2014, 05/03/2016, 04/21/2017  . Pneumococcal Conjugate-13 05/09/2013  . Pneumococcal Polysaccharide-23 05/04/2005  . Td 07/29/2009  . Tdap 03/11/2015    Health Maintenance  Topic Date Due  . MAMMOGRAM  05/07/2007  . PAP SMEAR  12/03/2012  . INFLUENZA VACCINE  01/18/2018  . COLONOSCOPY  08/12/2020  . TETANUS/TDAP  03/10/2025  . Hepatitis C Screening  Completed  . HIV Screening  Completed    Lab Results  Component Value Date   WBC 5.8 11/14/2017   HGB 12.4 11/14/2017   HCT 37.0 11/14/2017   PLT 302.0 11/14/2017   GLUCOSE 133 (H) 11/14/2017   CHOL 150 11/14/2017   TRIG 75.0 11/14/2017   HDL 59.90 11/14/2017   LDLDIRECT 177.8 09/07/2011   LDLCALC 75 11/14/2017   ALT 14 11/14/2017   AST 13 11/14/2017   NA 140 11/14/2017   K 4.4 11/14/2017   CL 102 11/14/2017   CREATININE 0.65 11/14/2017   BUN 11 11/14/2017   CO2 30 11/14/2017   TSH 1.47 11/14/2017   INR 1.0 08/21/2017   HGBA1C 5.7 11/14/2017    Lab Results  Component Value Date   TSH 1.47 11/14/2017   Lab Results  Component Value Date   WBC 5.8 11/14/2017   HGB 12.4 11/14/2017   HCT 37.0 11/14/2017   MCV 90.6 11/14/2017   PLT 302.0 11/14/2017   Lab Results  Component Value Date   NA 140 11/14/2017   K 4.4 11/14/2017   CO2 30 11/14/2017   GLUCOSE 133 (H) 11/14/2017   BUN 11 11/14/2017   CREATININE 0.65  11/14/2017   BILITOT 0.3 11/14/2017   ALKPHOS 95 11/14/2017   AST 13 11/14/2017   ALT 14 11/14/2017   PROT 6.6 11/14/2017   ALBUMIN 4.1 11/14/2017   CALCIUM 9.5 11/14/2017   ANIONGAP 6 08/24/2017   GFR 98.65 11/14/2017   Lab Results  Component Value Date   CHOL 150 11/14/2017   Lab Results  Component Value Date   HDL 59.90 11/14/2017   Lab Results  Component Value Date   LDLCALC 75 11/14/2017   Lab Results  Component Value Date   TRIG 75.0 11/14/2017   Lab Results  Component Value Date   CHOLHDL 3 11/14/2017   Lab Results  Component Value Date   HGBA1C 5.7 11/14/2017         Assessment & Plan:   Problem List Items Addressed This Visit    Hyperglycemia - Primary (Chronic)    hgba1c acceptable, minimize simple carbs. Increase exercise as tolerated.       Relevant Orders   Hemoglobin A1c (Completed)   MRSA (methicillin resistant Staphylococcus aureus) colonization (Chronic)   Hyperlipidemia, mixed  encouraged heart healthy diet, avoid trans fats, minimize simple carbs and saturated fats. Increase exercise as tolerated      Relevant Orders   Lipid panel (Completed)   CAD (coronary artery disease)    Feeling better s/p recent stenting of circumflex artery. She is following with cardiology and is on Plavix presently and tolerates it well.      Relevant Orders   CBC (Completed)   Comprehensive metabolic panel (Completed)   TSH (Completed)      I have discontinued Tenna Child. Dimitroff's vitamin C. I am also having her maintain her Vitamin D, aspirin, fexofenadine, ipratropium-albuterol, guaiFENesin, Evolocumab, nitroGLYCERIN, montelukast, tiotropium, ALPRAZolam, FASENRA, furosemide, venlafaxine XR, Magnesium Oxide, zinc gluconate, Calcium-Magnesium-Vitamin D, clopidogrel, mometasone, and mometasone.  No orders of the defined types were placed in this encounter.   CMA served as Education administrator during this visit. History, Physical and Plan performed by medical  provider. Documentation and orders reviewed and attested to.  [name], MD

## 2017-11-14 NOTE — Patient Instructions (Signed)
Tylenol/Arthritis 650 mg 1-2 tabs twice daily (max of 4 tabs in 24 hours) (max of 3000 mg in 24 hours)   Back Pain, Adult Back pain is very common. The pain often gets better over time. The cause of back pain is usually not dangerous. Most people can learn to manage their back pain on their own. Follow these instructions at home: Watch your back pain for any changes. The following actions may help to lessen any pain you are feeling:  Stay active. Start with short walks on flat ground if you can. Try to walk farther each day.  Exercise regularly as told by your doctor. Exercise helps your back heal faster. It also helps avoid future injury by keeping your muscles strong and flexible.  Do not sit, drive, or stand in one place for more than 30 minutes.  Do not stay in bed. Resting more than 1-2 days can slow down your recovery.  Be careful when you bend or lift an object. Use good form when lifting: ? Bend at your knees. ? Keep the object close to your body. ? Do not twist.  Sleep on a firm mattress. Lie on your side, and bend your knees. If you lie on your back, put a pillow under your knees.  Take medicines only as told by your doctor.  Put ice on the injured area. ? Put ice in a plastic bag. ? Place a towel between your skin and the bag. ? Leave the ice on for 20 minutes, 2-3 times a day for the first 2-3 days. After that, you can switch between ice and heat packs.  Avoid feeling anxious or stressed. Find good ways to deal with stress, such as exercise.  Maintain a healthy weight. Extra weight puts stress on your back.  Contact a doctor if:  You have pain that does not go away with rest or medicine.  You have worsening pain that goes down into your legs or buttocks.  You have pain that does not get better in one week.  You have pain at night.  You lose weight.  You have a fever or chills. Get help right away if:  You cannot control when you poop (bowel movement) or  pee (urinate).  Your arms or legs feel weak.  Your arms or legs lose feeling (numbness).  You feel sick to your stomach (nauseous) or throw up (vomit).  You have belly (abdominal) pain.  You feel like you may pass out (faint). This information is not intended to replace advice given to you by your health care provider. Make sure you discuss any questions you have with your health care provider. Document Released: 11/23/2007 Document Revised: 11/12/2015 Document Reviewed: 10/08/2013 Elsevier Interactive Patient Education  [employer].

## 2017-11-15 LAB — COMPREHENSIVE METABOLIC PANEL
ALK PHOS: 95 U/L (ref 39–117)
ALT: 14 U/L (ref 0–35)
AST: 13 U/L (ref 0–37)
Albumin: 4.1 g/dL (ref 3.5–5.2)
BUN: 11 mg/dL (ref 6–23)
CALCIUM: 9.5 mg/dL (ref 8.4–10.5)
CO2: 30 meq/L (ref 19–32)
Chloride: 102 mEq/L (ref 96–112)
Creatinine, Ser: 0.65 mg/dL (ref 0.40–1.20)
GFR: 98.65 mL/min (ref >60.00)
Glucose, Bld: 133 mg/dL — ABNORMAL HIGH (ref 70–99)
Potassium: 4.4 mEq/L (ref 3.5–5.1)
Sodium: 140 mEq/L (ref 135–145)
Total Bilirubin: 0.3 mg/dL (ref 0.2–1.2)
Total Protein: 6.6 g/dL (ref 6.0–8.3)

## 2017-11-15 LAB — CBC
HCT: 37 % (ref 36.0–46.0)
HEMOGLOBIN: 12.4 g/dL (ref 12.0–15.0)
MCHC: 33.4 g/dL (ref 30.0–36.0)
MCV: 90.6 fl (ref 78.0–100.0)
PLATELETS: 302 10*3/uL (ref 150.0–400.0)
RBC: 4.08 Mil/uL (ref 3.87–5.11)
RDW: 13.9 % (ref 11.5–15.5)
WBC: 5.8 10*3/uL (ref 4.0–10.5)

## 2017-11-15 LAB — LIPID PANEL
CHOL/HDL RATIO: 3
Cholesterol: 150 mg/dL (ref 0–200)
HDL: 59.9 mg/dL (ref >39.00)
LDL Cholesterol: 75 mg/dL (ref 0–99)
NonHDL: 90.11
TRIGLYCERIDES: 75 mg/dL (ref 0.0–149.0)
VLDL: 15 mg/dL (ref 0.0–40.0)

## 2017-11-15 LAB — HEMOGLOBIN A1C: HEMOGLOBIN A1C: 5.7 % (ref 4.6–6.5)

## 2017-11-15 LAB — TSH: TSH: 1.47 u[IU]/mL (ref 0.35–4.50)

## 2017-11-19 NOTE — Assessment & Plan Note (Signed)
encouraged heart healthy diet, avoid trans fats, minimize simple carbs and saturated fats. Increase exercise as tolerated 

## 2017-11-19 NOTE — Assessment & Plan Note (Signed)
hgba1c acceptable, minimize simple carbs. Increase exercise as tolerated.  

## 2017-11-19 NOTE — Assessment & Plan Note (Signed)
Feeling better s/p recent stenting of circumflex artery. She is following with cardiology and is on Plavix presently and tolerates it well.

## 2017-11-27 ENCOUNTER — Ambulatory Visit: Payer: PPO | Admitting: Cardiology

## 2017-11-27 ENCOUNTER — Encounter: Payer: Self-pay | Admitting: Cardiology

## 2017-11-27 VITALS — BP 126/86 | HR 72 | Ht 61.0 in | Wt 161.2 lb

## 2017-11-27 DIAGNOSIS — I2510 Atherosclerotic heart disease of native coronary artery without angina pectoris: Secondary | ICD-10-CM | POA: Diagnosis not present

## 2017-11-27 DIAGNOSIS — E782 Mixed hyperlipidemia: Secondary | ICD-10-CM

## 2017-11-27 NOTE — Progress Notes (Signed)
Cardiology Office Note    Date:  11/27/2017   ID:  Victoria, [name] 12-Feb-1956, MRN [id number]  PCP:  Mosie Lukes, MD  Cardiologist:  Dr. Meda Coffee Pulmonary: [name]   Chief Complaint: Hospital follow up s/p stenting  History of Present Illness:   Victoria [name] is a 62 y.o. female of severe persistent asthma, HL on Repatha, obesity presents for cath follow up.   In 2016 she  complaints of severe fatigue and worse DOE for the last 6 months. She had seen her pulmonologist several times and also had been to the emergency room several times. She complained of decreased exercise tolerance with elevated heart rate and blood pressure, palpitations with any type of activity. She c/o chest pain frequently. She noted relief with NTG in the past. We set her up for LHC and an Echo. Cardiac cath 11/2014 demonstrated mod non-obstructive CAD with ostial OM1 60%. This was not felt to be severe enough to proceed with PCI. Vasospasm could not be excluded. PRN Nitro was recommended.   Seen by me 08/21/17 for chest pain for symptoms concerning for unstable angina.  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 2nd OM with 95% stenosis s/p synergy DES.  EF 55-65%.  Will continue DAPT with ASA and Plavix for at least one year. Not on BB, will be discussed at [place], she has Asthma and recent exacerbation.    Here today for follow up. No recurrent angina. She is now able to walk uphill. No chest pain, SOB, orthopnea, palpitations, PND, LE edema or melena. She did not tolerated protonix, discontinued by herself, now taker apple cider vinegar with improved symptoms.   11/27/2017, the patient is coming 3 months post stent placement in February 2019, she states that she feels better that she felt in many years, she is able to walk and exercise, she eats healthy, she is little frustrated  that she has not lost any weight yet.  She also states that her asthma has been better than ever.  She had one episode of nosebleeds self-limiting.  She is otherwise tolerating medications well and is compliant with them.   Past Medical History:  Diagnosis Date  . Allergic bronchopulmonary aspergillosis ([place]) 2008   sees Dr Edmund Hilda pulmonology  . Anemia    iron deficiency, resolved  . Anxiety   . Asthma   . CAD (coronary artery disease)    a. LHC 6/16:  oOM1 60, pRCA 25 >> med Rx b. cath 3/19 2nd OM with 95% stenosis s/p synergy DES & anomalous RCA  . CAP (community acquired pneumonia) 2016; 06/07/2016  . Chronic bronchitis ([place])   . Chronic lower back pain   . Complication of anesthesia    "think I have a hard time waking up from it"  . COPD (chronic obstructive pulmonary disease) ([place])   . Depression    mild  . Diverticulosis   . GERD (gastroesophageal reflux disease)   . H/O hiatal hernia   . Headache    "weekly" (08/23/2017)  . History of echocardiogram    Echo 6/16:  Mod LVH, EF 60-65%, no RWMA, Gr 1 DD, trivial MR, normal LA size.  Marland Kitchen Hyperglycemia 11/20/2015  . Hyperlipidemia, mixed 09/11/2007   Qualifier: Diagnosis of  By: Jerold Coombe   Did not tolerate Lipitor, zocor, Lovastatin, Pravastatin, Livalo, Crestor even low dose   .  IBS (irritable bowel syndrome)   . Maxillary sinusitis   . Normal cardiac stress test 11/2011   No evidence of ischemia or infarct. Calculated ejection fraction 72%.  . Obesity   . OSA (obstructive sleep apnea) 02/2012   has stopped using  cpap  . Osteoarthritis   . Osteoporosis   . Pneumonia 11/2011   "before 2013 I hadn't had pneumonia since I was a child" (04/13/2012)  . Pulmonary nodules   . S/P angioplasty with stent 08/23/17 ostial 2nd OM with DES synnergy 08/24/2017  . Schatzki's ring     Past Surgical History:  Procedure Laterality Date  . APPENDECTOMY  1989  . CARDIAC CATHETERIZATION N/A 11/25/2014   Procedure: Right/Left  Heart Cath and Coronary Angiography;  Surgeon: Belva Crome, MD;  Location: [facility];  Service: Cardiovascular;  Laterality: N/A;  . CESAREAN SECTION  1985  . CORONARY ANGIOPLASTY WITH STENT PLACEMENT  08/23/2017  . CORONARY STENT INTERVENTION N/A 08/23/2017   Procedure: CORONARY STENT INTERVENTION;  Surgeon: Burnell Blanks, MD;  Location: [facility];  Service: Cardiovascular;  Laterality: N/A;  . HERNIA REPAIR  04/13/2012   VHR laparoscopic  . LEFT HEART CATH AND CORONARY ANGIOGRAPHY N/A 08/23/2017   Procedure: LEFT HEART CATH AND CORONARY ANGIOGRAPHY;  Surgeon: Burnell Blanks, MD;  Location: [facility];  Service: Cardiovascular;  Laterality: N/A;  . VENTRAL HERNIA REPAIR  04/13/2012   Procedure: LAPAROSCOPIC VENTRAL HERNIA;  Surgeon: Adin Hector, MD;  Location: [facility];  Service: General;  Laterality: N/A;  laparoscopic repair of incarcerated hernia    Current Medications: Prior to Admission medications   Medication Sig Start Date End Date Taking? Authorizing Provider  ALPRAZolam Duanne Moron) 0.5 MG tablet Take 1 tablet (0.5 mg total) by mouth at bedtime as needed for anxiety (Palpatations, SOB). 01/16/17   [name], DO  aspirin 81 MG tablet Take 81 mg by mouth daily.     [provider]  Calcium-Magnesium-Vitamin D 300-150-400 MG-MG-UNIT TABS Take 1 tablet by mouth daily.    [provider]  Cholecalciferol (VITAMIN D) 2000 UNITS CAPS Take 2,000 Units by mouth daily.     [provider]  clopidogrel (PLAVIX) 75 MG tablet Take 1 tablet (75 mg total) by mouth daily with breakfast. 08/24/17   Isaiah Serge, NP  cyclobenzaprine (FLEXERIL) 10 MG tablet Take 1 tablet (10 mg total) by mouth 3 (three) times daily as needed for muscle spasms. 05/03/16   Opalski, Neoma Laming, DO  Evolocumab (REPATHA SURECLICK) 400 MG/ML SOAJ Inject 1 pen into the skin every 14 (fourteen) days. 08/12/16   Dorothy Spark, MD  FASENRA 30 MG/ML SOSY  Inject 1 Syringe into the muscle every 30 (thirty) days. 08/04/17   [provider]  fexofenadine (ALLEGRA) 180 MG tablet Take 180 mg by mouth daily as needed for allergies.     [provider]  furosemide (LASIX) 20 MG tablet Take 1 tablet by mouth daily for 2 days then take only as needed for swelling 08/21/17   Bhagat, Bhavinkumar, PA  guaiFENesin (MUCINEX) 600 MG 12 hr tablet Take 2 tablets (1,200 mg total) by mouth 2 (two) times daily as needed. Patient taking differently: Take 1,200 mg by mouth 2 (two) times daily as needed for cough.  06/10/16   Debbe Odea, MD  ipratropium-albuterol (DUONEB) 0.5-2.5 (3) MG/3ML SOLN INHALE THE CONTENTS OF 1 VIAL VIA NEBULIZER UP TO 4 TO 6 TIMES A DAY. Patient taking differently: INHALE  THE CONTENTS OF 1 VIAL VIA NEBULIZER UP TO 4 TO 6 TIMES A DAY AS NEEDED 11/24/15   Brand Males, MD  Magnesium Oxide 250 MG TABS Take 250 mg by mouth daily.    [provider]  mometasone (ASMANEX 120 METERED DOSES) 220 MCG/INH inhaler Inhale 2 puffs into the lungs 2 (two) times daily. Patient taking differently: Inhale 1 puff into the lungs 2 (two) times daily.  01/16/17   Brand Males, MD  montelukast (SINGULAIR) 10 MG tablet TAKE 1 TABLET BY MOUTH ONCE DAILY 12/19/16   Brand Males, MD  nitroGLYCERIN (NITROSTAT) 0.4 MG SL tablet Place 1 tablet (0.4 mg total) under the tongue every 5 (five) minutes as needed for chest pain. 08/26/16   Dorothy Spark, MD  pantoprazole (PROTONIX) 40 MG tablet Take 1 tablet (40 mg total) by mouth daily. 08/24/17   Isaiah Serge, NP  tiotropium (SPIRIVA HANDIHALER) 18 MCG inhalation capsule INHALE THE CONTENTS OF ONE CAPSULE DAILY 01/16/17   Brand Males, MD  venlafaxine XR (EFFEXOR-XR) 150 MG 24 hr capsule Take 150 mg by mouth daily with breakfast.    [provider]  vitamin C (ASCORBIC ACID) 500 MG tablet Take 500 mg by mouth daily.    [provider]  zinc gluconate 50 MG tablet Take  50 mg by mouth daily.    [provider]    Allergies:   Beclomethasone dipropionate; Budesonide-formoterol fumarate; Mometasone furo-formoterol fum; Sulfonamide derivatives; Pulmicort [budesonide]; and Statins   Social History   Socioeconomic History  . Marital status: Married    Spouse name: Not on file  . Number of children: 1  . Years of education: Not on file  . Highest education level: Not on file  Occupational History    Employer: UNEMPLOYED  Social Needs  . Financial resource strain: Not on file  . Food insecurity:    Worry: Not on file    Inability: Not on file  . Transportation needs:    Medical: Not on file    Non-medical: Not on file  Tobacco Use  . Smoking status: Never Smoker  . Smokeless tobacco: Never Used  Substance and Sexual Activity  . Alcohol use: Yes    Alcohol/week: 2.4 oz    Types: 2 Glasses of wine, 2 Cans of beer per week  . Drug use: No  . Sexual activity: Yes    Comment: gluten free, lives with husband and son with CP quadriplegia  Lifestyle  . Physical activity:    Days per week: Not on file    Minutes per session: Not on file  . Stress: Not on file  Relationships  . Social connections:    Talks on phone: Not on file    Gets together: Not on file    Attends religious service: Not on file    Active member of club or organization: Not on file    Attends meetings of clubs or organizations: Not on file    Relationship status: Not on file  Other Topics Concern  . Not on file  Social History Narrative   Cares for a 54yo son with cerebral palsy.      Family History:  The patient's family history includes Breast cancer in her mother and sister; Diabetes in her mother; Diverticulosis in her father; Heart attack in her maternal grandfather; Hypertension in her mother; Prostate cancer in her father; Pulmonary embolism in her brother.   ROS:   Please see the history of present illness.  ROS All other systems reviewed and are  negative.   PHYSICAL EXAM:   VS:  BP 126/86   Pulse 72   Ht 5\' 1"  (1.549 m)   Wt 161 lb 3.2 oz (73.1 kg)   SpO2 99%   BMI 30.46 kg/m    GEN: Well nourished, well developed, in no acute distress  HEENT: normal  Neck: no JVD, carotid bruits, or masses Cardiac: RRR; no murmurs, rubs, or gallops,no edema  Respiratory:  clear to auscultation bilaterally, normal work of breathing GI: soft, nontender, nondistended, + BS MS: no deformity or atrophy  Skin: warm and dry, no rash Neuro:  Alert and Oriented x 3, Strength and sensation are intact Psych: euthymic mood, full affect  Wt Readings from Last 3 Encounters:  11/27/17 161 lb 3.2 oz (73.1 kg)  11/14/17 161 lb 9.6 oz (73.3 kg)  10/19/17 162 lb 14.4 oz (73.9 kg)      Studies/Labs Reviewed:   EKG:  EKG is not ordered today.    Recent Labs: 08/21/2017: NT-Pro BNP <5 11/14/2017: ALT 14; BUN 11; Creatinine, Ser 0.65; Hemoglobin 12.4; Platelets 302.0; Potassium 4.4; Sodium 140; TSH 1.47   Lipid Panel    Component Value Date/Time   CHOL 150 11/14/2017 1721   CHOL 227 (H) 07/25/2016 0822   TRIG 75.0 11/14/2017 1721   HDL 59.90 11/14/2017 1721   HDL 51 07/25/2016 0822   CHOLHDL 3 11/14/2017 1721   VLDL 15.0 11/14/2017 1721   LDLCALC 75 11/14/2017 1721   LDLCALC 160 (H) 07/25/2016 0822   LDLDIRECT 177.8 09/07/2011 0945    Additional studies/ records that were reviewed today include:   08/23/17  LEFT HEART CATH AND CORONARY ANGIOGRAPHY  Conclusion     Ost 2nd Mrg lesion is 95% stenosed.  A drug-eluting stent was successfully placed using a STENT SYNERGY DES 3X16.  Post intervention, there is a 0% residual stenosis.  The left ventricular systolic function is normal.  LV end diastolic pressure is normal.  The left ventricular ejection fraction is 55-65% by visual estimate.  There is no mitral valve regurgitation.  1. Severe single vessel CAD with severe stenosis in the ostium of the large obtuse marginal branch.    2. Successful PTCA/DES x 1 ostium of the OM. 3. 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  4. The LAD has no obstructive disease.  5. Normal LV systolic function  Recommendations: Will continue DAPT with ASA and Plavix for at least one year. Consider addition of a beta blocker or nitrate.        ASSESSMENT & PLAN:    1. CAD s/p DES to OM - no angina. Continue ASA and plavix. Not on BB due to severe asthma.  Her heart rate is acceptable and we do not necessarily have to start her on beta-blockers considering her asthma. Her EKG today is completely normal.  2. HLD  - 11/14/2017: Cholesterol 150; HDL 59.90; LDL Cholesterol 75; Triglycerides 75.0; VLDL 15.0  - On Repatha improved significantly, she is tolerating it well, she has no co-pay, will continue.  She also eats very healthy.  3. Chronic diastolic CHF - Euvolemic. Continue lasix.   4.  Chronic back pain -potentially requiring back surgery, patient is advised to wait at least 6 months post stent placement.  Follow-up in 6 months.  Medication Adjustments/Labs and Tests Ordered: Current medicines are reviewed at length with the patient today.  Concerns regarding medicines  are outlined above.  Medication changes, Labs and Tests ordered today are listed in the Patient Instructions below. Patient Instructions  Medication Instructions:   Your physician recommends that you continue on your current medications as directed. Please refer to the Current Medication list given to you today.     Follow-Up:  Your physician wants you to follow-up in: [place] will receive a reminder letter in the mail two months in advance. If you don't receive a letter, please call our office to schedule the follow-up appointment.        If you need a refill on your cardiac medications before your next appointment, please call your pharmacy.      Signed, Ena Dawley, MD  11/27/2017 11:10 AM    [address], [place], [place]  [address] Phone: [phone]; Fax: [phone]

## 2017-11-27 NOTE — Patient Instructions (Signed)
Medication Instructions:   Your physician recommends that you continue on your current medications as directed. Please refer to the Current Medication list given to you today.    Follow-Up:  Your physician wants you to follow-up in: 6 MONTHS WITH DR NELSON You will receive a reminder letter in the mail two months in advance. If you don't receive a letter, please call our office to schedule the follow-up appointment.        If you need a refill on your cardiac medications before your next appointment, please call your pharmacy.   

## 2017-12-07 DIAGNOSIS — Z6830 Body mass index (BMI) 30.0-30.9, adult: Secondary | ICD-10-CM | POA: Diagnosis not present

## 2017-12-07 DIAGNOSIS — Z124 Encounter for screening for malignant neoplasm of cervix: Secondary | ICD-10-CM | POA: Diagnosis not present

## 2017-12-07 DIAGNOSIS — Z1231 Encounter for screening mammogram for malignant neoplasm of breast: Secondary | ICD-10-CM | POA: Diagnosis not present

## 2017-12-11 ENCOUNTER — Telehealth: Payer: Self-pay | Admitting: Internal Medicine

## 2017-12-11 NOTE — Telephone Encounter (Signed)
Called pt back lmom, Medicine will be here in 3 to 5 business days. Victoria [name] usually comes in the next day or 2.

## 2017-12-11 NOTE — Telephone Encounter (Signed)
1 prefilled syringe Ordered date: 12/11/17 Shipping date: Automated service said it would be here in 3 to 5 bus. Days.

## 2017-12-13 NOTE — Telephone Encounter (Signed)
Arrival Date:12/13/17 Lot #: [id number] Exp date: 02/2019

## 2017-12-18 ENCOUNTER — Ambulatory Visit: Payer: PPO

## 2017-12-19 ENCOUNTER — Ambulatory Visit (INDEPENDENT_AMBULATORY_CARE_PROVIDER_SITE_OTHER): Payer: PPO

## 2017-12-19 DIAGNOSIS — J82 Pulmonary eosinophilia, not elsewhere classified: Secondary | ICD-10-CM | POA: Diagnosis not present

## 2017-12-19 DIAGNOSIS — J8289 Other pulmonary eosinophilia, not elsewhere classified: Secondary | ICD-10-CM

## 2017-12-20 MED ORDER — BENRALIZUMAB 30 MG/ML ~~LOC~~ SOSY
30.0000 mg | PREFILLED_SYRINGE | Freq: Once | SUBCUTANEOUS | Status: AC
  Administered 2017-12-19: 30 mg via SUBCUTANEOUS

## 2017-12-20 NOTE — Progress Notes (Signed)
Documentation of medication administration and charges of [name] have been completed by Desmond Dike, CMA based on the [facility] documentation sheet completed by Alroy Bailiff.

## 2018-01-04 ENCOUNTER — Encounter: Payer: PPO | Admitting: Family Medicine

## 2018-01-30 ENCOUNTER — Other Ambulatory Visit: Payer: Self-pay | Admitting: Family Medicine

## 2018-01-30 ENCOUNTER — Encounter: Payer: Self-pay | Admitting: Family Medicine

## 2018-01-30 DIAGNOSIS — G8929 Other chronic pain: Secondary | ICD-10-CM

## 2018-01-30 DIAGNOSIS — M546 Pain in thoracic spine: Principal | ICD-10-CM

## 2018-01-30 NOTE — Progress Notes (Unsigned)
amb ref °

## 2018-02-12 DIAGNOSIS — M545 Low back pain: Secondary | ICD-10-CM | POA: Diagnosis not present

## 2018-02-13 ENCOUNTER — Telehealth: Payer: Self-pay | Admitting: Internal Medicine

## 2018-02-14 ENCOUNTER — Telehealth: Payer: Self-pay | Admitting: Internal Medicine

## 2018-02-14 ENCOUNTER — Other Ambulatory Visit: Payer: Self-pay | Admitting: Internal Medicine

## 2018-02-14 NOTE — Telephone Encounter (Signed)
Arrival Date: 02/14/18 Lot #: [id number] Exp date: 12/2018

## 2018-02-14 NOTE — Telephone Encounter (Signed)
Error

## 2018-02-14 NOTE — Telephone Encounter (Signed)
1 prefilled syringe Ordered date: 02/13/18 Shipping date: 02/13/18

## 2018-02-15 ENCOUNTER — Ambulatory Visit: Payer: PPO

## 2018-02-22 ENCOUNTER — Ambulatory Visit (INDEPENDENT_AMBULATORY_CARE_PROVIDER_SITE_OTHER): Payer: PPO

## 2018-02-22 DIAGNOSIS — J82 Pulmonary eosinophilia, not elsewhere classified: Secondary | ICD-10-CM | POA: Diagnosis not present

## 2018-02-22 DIAGNOSIS — M545 Low back pain: Secondary | ICD-10-CM | POA: Diagnosis not present

## 2018-02-22 DIAGNOSIS — J8289 Other pulmonary eosinophilia, not elsewhere classified: Secondary | ICD-10-CM

## 2018-02-22 MED ORDER — BENRALIZUMAB 30 MG/ML ~~LOC~~ SOSY
30.0000 mg | PREFILLED_SYRINGE | Freq: Once | SUBCUTANEOUS | Status: AC
  Administered 2018-02-22: 30 mg via SUBCUTANEOUS

## 2018-02-26 DIAGNOSIS — M545 Low back pain: Secondary | ICD-10-CM | POA: Diagnosis not present

## 2018-03-01 DIAGNOSIS — M545 Low back pain: Secondary | ICD-10-CM | POA: Diagnosis not present

## 2018-03-05 DIAGNOSIS — M545 Low back pain: Secondary | ICD-10-CM | POA: Diagnosis not present

## 2018-03-08 DIAGNOSIS — M545 Low back pain: Secondary | ICD-10-CM | POA: Diagnosis not present

## 2018-03-15 DIAGNOSIS — M545 Low back pain: Secondary | ICD-10-CM | POA: Diagnosis not present

## 2018-03-22 DIAGNOSIS — M545 Low back pain: Secondary | ICD-10-CM | POA: Diagnosis not present

## 2018-03-29 ENCOUNTER — Telehealth: Payer: Self-pay | Admitting: *Deleted

## 2018-03-29 NOTE — Telephone Encounter (Signed)
Received Initial Examination and Daily notes for review; forwarded to provider/SLS 10/10

## 2018-03-30 ENCOUNTER — Other Ambulatory Visit: Payer: Self-pay | Admitting: Cardiology

## 2018-04-02 ENCOUNTER — Encounter: Payer: Self-pay | Admitting: Family Medicine

## 2018-04-02 ENCOUNTER — Ambulatory Visit (INDEPENDENT_AMBULATORY_CARE_PROVIDER_SITE_OTHER): Payer: PPO | Admitting: Family Medicine

## 2018-04-02 DIAGNOSIS — J3089 Other allergic rhinitis: Secondary | ICD-10-CM | POA: Diagnosis not present

## 2018-04-02 DIAGNOSIS — R739 Hyperglycemia, unspecified: Secondary | ICD-10-CM | POA: Diagnosis not present

## 2018-04-02 DIAGNOSIS — Z23 Encounter for immunization: Secondary | ICD-10-CM

## 2018-04-02 DIAGNOSIS — I2510 Atherosclerotic heart disease of native coronary artery without angina pectoris: Secondary | ICD-10-CM | POA: Diagnosis not present

## 2018-04-02 DIAGNOSIS — M545 Low back pain, unspecified: Secondary | ICD-10-CM

## 2018-04-02 DIAGNOSIS — J302 Other seasonal allergic rhinitis: Secondary | ICD-10-CM | POA: Diagnosis not present

## 2018-04-02 DIAGNOSIS — J8283 Eosinophilic asthma: Secondary | ICD-10-CM

## 2018-04-02 DIAGNOSIS — I25118 Atherosclerotic heart disease of native coronary artery with other forms of angina pectoris: Secondary | ICD-10-CM

## 2018-04-02 DIAGNOSIS — F329 Major depressive disorder, single episode, unspecified: Secondary | ICD-10-CM

## 2018-04-02 DIAGNOSIS — F32A Depression, unspecified: Secondary | ICD-10-CM

## 2018-04-02 DIAGNOSIS — M62838 Other muscle spasm: Secondary | ICD-10-CM | POA: Diagnosis not present

## 2018-04-02 DIAGNOSIS — J82 Pulmonary eosinophilia, not elsewhere classified: Secondary | ICD-10-CM

## 2018-04-02 DIAGNOSIS — E782 Mixed hyperlipidemia: Secondary | ICD-10-CM

## 2018-04-02 DIAGNOSIS — I2583 Coronary atherosclerosis due to lipid rich plaque: Secondary | ICD-10-CM

## 2018-04-02 MED ORDER — VENLAFAXINE HCL ER 37.5 MG PO CP24: 37.5000 mg | ORAL_CAPSULE | Freq: Every day | ORAL | 1 refills | Status: DC

## 2018-04-02 MED ORDER — VENLAFAXINE HCL 37.5 MG PO TABS: 37.5000 mg | ORAL_TABLET | Freq: Every day | ORAL | 1 refills | Status: DC

## 2018-04-02 MED ORDER — VENLAFAXINE HCL ER 75 MG PO CP24
75.0000 mg | ORAL_CAPSULE | Freq: Every day | ORAL | 1 refills | Status: DC
Start: 2018-04-02 — End: 2018-09-05

## 2018-04-02 NOTE — Assessment & Plan Note (Signed)
Encouraged heart healthy diet, increase exercise, avoid trans fats, consider a krill oil cap daily 

## 2018-04-02 NOTE — Assessment & Plan Note (Signed)
In low back and legs. Was at [facility] at [place] and then she fell and her pain flared again. Now it is improving again

## 2018-04-02 NOTE — Assessment & Plan Note (Signed)
Is doing well previously on Effexor XR a 37.5 mg tab + a 75 mg tab. Then dropped to 37.5 + 75 qod and 75 mg qod. Tried to 75 mg daily but anxiety and insomnia worsened so you went back up to 37.5 + 75 daily

## 2018-04-02 NOTE — Assessment & Plan Note (Signed)
hgba1c acceptable, minimize simple carbs. Increase exercise as tolerated. Continue current meds 

## 2018-04-02 NOTE — Assessment & Plan Note (Signed)
Asymptomatic doing well

## 2018-04-02 NOTE — Patient Instructions (Signed)
Can try Melatonin and Unisom   Insomnia Insomnia is a sleep disorder that makes it difficult to fall asleep or to stay asleep. Insomnia can cause tiredness (fatigue), low energy, difficulty concentrating, mood swings, and poor performance at work or school. There are three different ways to classify insomnia:  Difficulty falling asleep.  Difficulty staying asleep.  Waking up too early in the morning.  Any type of insomnia can be long-term (chronic) or short-term (acute). Both are common. Short-term insomnia usually lasts for three months or less. Chronic insomnia occurs at least three times a week for longer than three months. What are the causes? Insomnia may be caused by another condition, situation, or substance, such as:  Anxiety.  Certain medicines.  Gastroesophageal reflux disease (GERD) or other gastrointestinal conditions.  Asthma or other breathing conditions.  Restless legs syndrome, sleep apnea, or other sleep disorders.  Chronic pain.  Menopause. This may include hot flashes.  Stroke.  Abuse of alcohol, tobacco, or illegal drugs.  Depression.  Caffeine.  Neurological disorders, such as Alzheimer disease.  An overactive thyroid (hyperthyroidism).  The cause of insomnia may not be known. What increases the risk? Risk factors for insomnia include:  Gender. Women are more commonly affected than men.  Age. Insomnia is more common as you get older.  Stress. This may involve your professional or personal life.  Income. Insomnia is more common in people with lower income.  Lack of exercise.  Irregular work schedule or night shifts.  Traveling between different time zones.  What are the signs or symptoms? If you have insomnia, trouble falling asleep or trouble staying asleep is the main symptom. This may lead to other symptoms, such as:  Feeling fatigued.  Feeling nervous about going to sleep.  Not feeling rested in the morning.  Having trouble  concentrating.  Feeling irritable, anxious, or depressed.  How is this treated? Treatment for insomnia depends on the cause. If your insomnia is caused by an underlying condition, treatment will focus on addressing the condition. Treatment may also include:  Medicines to help you sleep.  Counseling or therapy.  Lifestyle adjustments.  Follow these instructions at home:  Take medicines only as directed by your health care provider.  Keep regular sleeping and waking hours. Avoid naps.  Keep a sleep diary to help you and your health care provider figure out what could be causing your insomnia. Include: ? When you sleep. ? When you wake up during the night. ? How well you sleep. ? How rested you feel the next day. ? Any side effects of medicines you are taking. ? What you eat and drink.  Make your bedroom a comfortable place where it is easy to fall asleep: ? Put up shades or special blackout curtains to block light from outside. ? Use a white noise machine to block noise. ? Keep the temperature cool.  Exercise regularly as directed by your health care provider. Avoid exercising right before bedtime.  Use relaxation techniques to manage stress. Ask your health care provider to suggest some techniques that may work well for you. These may include: ? Breathing exercises. ? Routines to release muscle tension. ? Visualizing peaceful scenes.  Cut back on alcohol, caffeinated beverages, and cigarettes, especially close to bedtime. These can disrupt your sleep.  Do not overeat or eat spicy foods right before bedtime. This can lead to digestive discomfort that can make it hard for you to sleep.  Limit screen use before bedtime. This includes: ? Watching  TV. ? Using your smartphone, tablet, and computer.  Stick to a routine. This can help you fall asleep faster. Try to do a quiet activity, brush your teeth, and go to bed at the same time each night.  Get out of bed if you are  still awake after 15 minutes of trying to sleep. Keep the lights down, but try reading or doing a quiet activity. When you feel sleepy, go back to bed.  Make sure that you drive carefully. Avoid driving if you feel very sleepy.  Keep all follow-up appointments as directed by your health care provider. This is important. Contact a health care provider if:  You are tired throughout the day or have trouble in your daily routine due to sleepiness.  You continue to have sleep problems or your sleep problems get worse. Get help right away if:  You have serious thoughts about hurting yourself or someone else. This information is not intended to replace advice given to you by your health care provider. Make sure you discuss any questions you have with your health care provider. Document Released: 06/03/2000 Document Revised: 11/06/2015 Document Reviewed: 03/07/2014 Elsevier Interactive Patient Education  [employer].

## 2018-04-03 LAB — CBC
HCT: 39 % (ref 36.0–46.0)
HEMOGLOBIN: 13 g/dL (ref 12.0–15.0)
MCHC: 33.4 g/dL (ref 30.0–36.0)
MCV: 91 fl (ref 78.0–100.0)
Platelets: 298 10*3/uL (ref 150.0–400.0)
RBC: 4.29 Mil/uL (ref 3.87–5.11)
RDW: 13.7 % (ref 11.5–15.5)
WBC: 6.8 10*3/uL (ref 4.0–10.5)

## 2018-04-03 LAB — COMPREHENSIVE METABOLIC PANEL
ALBUMIN: 4.2 g/dL (ref 3.5–5.2)
ALK PHOS: 88 U/L (ref 39–117)
ALT: 19 U/L (ref 0–35)
AST: 15 U/L (ref 0–37)
BILIRUBIN TOTAL: 0.3 mg/dL (ref 0.2–1.2)
BUN: 16 mg/dL (ref 6–23)
CO2: 31 mEq/L (ref 19–32)
Calcium: 9.5 mg/dL (ref 8.4–10.5)
Chloride: 103 mEq/L (ref 96–112)
Creatinine, Ser: 0.62 mg/dL (ref 0.40–1.20)
GFR: 104.04 mL/min (ref >60.00)
Glucose, Bld: 93 mg/dL (ref 70–99)
POTASSIUM: 4.1 meq/L (ref 3.5–5.1)
Sodium: 139 mEq/L (ref 135–145)
TOTAL PROTEIN: 6.4 g/dL (ref 6.0–8.3)

## 2018-04-03 LAB — HEMOGLOBIN A1C: Hgb A1c MFr Bld: 5.5 % (ref 4.6–6.5)

## 2018-04-03 LAB — LIPID PANEL
CHOLESTEROL: 150 mg/dL (ref 0–200)
HDL: 54.5 mg/dL (ref >39.00)
LDL Cholesterol: 69 mg/dL (ref 0–99)
NonHDL: 95.21
Total CHOL/HDL Ratio: 3
Triglycerides: 132 mg/dL (ref 0.0–149.0)
VLDL: 26.4 mg/dL (ref 0.0–40.0)

## 2018-04-03 LAB — TSH: TSH: 1.51 u[IU]/mL (ref 0.35–4.50)

## 2018-04-04 NOTE — Assessment & Plan Note (Signed)
Is doing well symptomatically s/p her stent which she tolerated well

## 2018-04-04 NOTE — Progress Notes (Signed)
Subjective:    Patient ID: Victoria [name], female    DOB: May 17, 1956, 62 y.o.   MRN: [id number]  No chief complaint on file.   HPI Patient is in today for follow-up.  Overall she is doing well.  She did have to have a cardiac cath and stenting but tolerated it well and is asymptomatic in this regard since then.  She did have a fall and a flare in her low back pain as a result.  She is undergone physical therapy and is now improving.  No recent febrile illness or hospitalizations.  Her Effexor is working well and she is needing her Xanax so infrequently that she is considering discontinuing this medication. Denies CP/palp/SOB/HA/congestion/fevers/GI or GU c/o. Taking meds as prescribed  Past Medical History:  Diagnosis Date  . Allergic bronchopulmonary aspergillosis ([place]) 2008   sees Dr Edmund Hilda pulmonology  . Anemia    iron deficiency, resolved  . Anxiety   . Asthma   . CAD (coronary artery disease)    a. LHC 6/16:  oOM1 60, pRCA 25 >> med Rx b. cath 3/19 2nd OM with 95% stenosis s/p synergy DES & anomalous RCA  . CAP (community acquired pneumonia) 2016; 06/07/2016  . Chronic bronchitis ([place])   . Chronic lower back pain   . Complication of anesthesia    "think I have a hard time waking up from it"  . COPD (chronic obstructive pulmonary disease) ([place])   . Depression    mild  . Diverticulosis   . GERD (gastroesophageal reflux disease)   . H/O hiatal hernia   . Headache    "weekly" (08/23/2017)  . History of echocardiogram    Echo 6/16:  Mod LVH, EF 60-65%, no RWMA, Gr 1 DD, trivial MR, normal LA size.  Marland Kitchen Hyperglycemia 11/20/2015  . Hyperlipidemia, mixed 09/11/2007   Qualifier: Diagnosis of  By: Jerold Coombe   Did not tolerate Lipitor, zocor, Lovastatin, Pravastatin, Livalo, Crestor even low dose   . IBS (irritable bowel syndrome)   . Maxillary sinusitis   . Normal cardiac stress test 11/2011   No evidence of ischemia or infarct. Calculated ejection fraction 72%.    . Obesity   . OSA (obstructive sleep apnea) 02/2012   has stopped using  cpap  . Osteoarthritis   . Osteoporosis   . Pneumonia 11/2011   "before 2013 I hadn't had pneumonia since I was a child" (04/13/2012)  . Pulmonary nodules   . S/P angioplasty with stent 08/23/17 ostial 2nd OM with DES synnergy 08/24/2017  . Schatzki's ring     Past Surgical History:  Procedure Laterality Date  . APPENDECTOMY  1989  . CARDIAC CATHETERIZATION N/A 11/25/2014   Procedure: Right/Left Heart Cath and Coronary Angiography;  Surgeon: Belva Crome, MD;  Location: [facility];  Service: Cardiovascular;  Laterality: N/A;  . CESAREAN SECTION  1985  . CORONARY ANGIOPLASTY WITH STENT PLACEMENT  08/23/2017  . CORONARY STENT INTERVENTION N/A 08/23/2017   Procedure: CORONARY STENT INTERVENTION;  Surgeon: Burnell Blanks, MD;  Location: [facility];  Service: Cardiovascular;  Laterality: N/A;  . HERNIA REPAIR  04/13/2012   VHR laparoscopic  . LEFT HEART CATH AND CORONARY ANGIOGRAPHY N/A 08/23/2017   Procedure: LEFT HEART CATH AND CORONARY ANGIOGRAPHY;  Surgeon: Burnell Blanks, MD;  Location: [facility];  Service: Cardiovascular;  Laterality: N/A;  . VENTRAL HERNIA REPAIR  04/13/2012   Procedure: LAPAROSCOPIC VENTRAL HERNIA;  Surgeon: Edsel Petrin  Dalbert Batman, MD;  Location: [facility];  Service: General;  Laterality: N/A;  laparoscopic repair of incarcerated hernia    Family History  Problem Relation Age of Onset  . Breast cancer Mother   . Hypertension Mother   . Diabetes Mother   . Diverticulosis Father   . Prostate cancer Father   . Pulmonary embolism Brother        recurrent  . Heart attack Maternal Grandfather   . Breast cancer Sister   . Stroke Neg Hx     Social History   Socioeconomic History  . Marital status: Married    Spouse name: Not on file  . Number of children: 1  . Years of education: Not on file  . Highest education level: Not on file  Occupational History     Employer: UNEMPLOYED  Social Needs  . Financial resource strain: Not on file  . Food insecurity:    Worry: Not on file    Inability: Not on file  . Transportation needs:    Medical: Not on file    Non-medical: Not on file  Tobacco Use  . Smoking status: Never Smoker  . Smokeless tobacco: Never Used  Substance and Sexual Activity  . Alcohol use: Yes    Alcohol/week: 4.0 standard drinks    Types: 2 Glasses of wine, 2 Cans of beer per week  . Drug use: No  . Sexual activity: Yes    Comment: gluten free, lives with husband and son with CP quadriplegia  Lifestyle  . Physical activity:    Days per week: Not on file    Minutes per session: Not on file  . Stress: Not on file  Relationships  . Social connections:    Talks on phone: Not on file    Gets together: Not on file    Attends religious service: Not on file    Active member of club or organization: Not on file    Attends meetings of clubs or organizations: Not on file    Relationship status: Not on file  . Intimate partner violence:    Fear of current or ex partner: Not on file    Emotionally abused: Not on file    Physically abused: Not on file    Forced sexual activity: Not on file  Other Topics Concern  . Not on file  Social History Narrative   Cares for a 50yo son with cerebral palsy.     Outpatient Medications Prior to Visit  Medication Sig Dispense Refill  . ALPRAZolam (XANAX) 0.5 MG tablet Take 1 tablet (0.5 mg total) by mouth at bedtime as needed for anxiety (Palpatations, SOB). 30 tablet 0  . aspirin 81 MG tablet Take 81 mg by mouth daily.     . Calcium-Magnesium-Vitamin D 300-150-400 MG-MG-UNIT TABS Take 1 tablet by mouth daily.    . Cholecalciferol (VITAMIN D) 2000 UNITS CAPS Take 2,000 Units by mouth daily.     . clopidogrel (PLAVIX) 75 MG tablet TAKE 1 TABLET BY MOUTH DAILY WITH BREAKFAST. 30 tablet 6  . Evolocumab (REPATHA SURECLICK) 093 MG/ML SOAJ Inject 1 pen into the skin every 14 (fourteen) days. 2  pen 11  . FASENRA 30 MG/ML SOSY Inject 1 Syringe into the muscle every 30 (thirty) days.    . fexofenadine (ALLEGRA) 180 MG tablet Take 180 mg by mouth daily as needed for allergies.     . furosemide (LASIX) 20 MG tablet Take 1 tablet by mouth daily for 2 days then take  only as needed for swelling 90 tablet 3  . guaiFENesin (MUCINEX) 600 MG 12 hr tablet Take 2 tablets (1,200 mg total) by mouth 2 (two) times daily as needed. (Patient taking differently: Take 1,200 mg by mouth 2 (two) times daily as needed for cough. ) 30 tablet 1  . ipratropium-albuterol (DUONEB) 0.5-2.5 (3) MG/3ML SOLN INHALE THE CONTENTS OF 1 VIAL VIA NEBULIZER UP TO 4 TO 6 TIMES A DAY. 360 mL 2  . Magnesium Oxide 250 MG TABS Take 250 mg by mouth daily.    . mometasone (ASMANEX 120 METERED DOSES) 220 MCG/INH inhaler Inhale 2 puffs into the lungs 2 (two) times daily. 1 Inhaler 12  . mometasone (ASMANEX 60 METERED DOSES) 220 MCG/INH inhaler Inhale 2 puffs into the lungs 2 (two) times daily. 1 Inhaler 12  . montelukast (SINGULAIR) 10 MG tablet TAKE 1 TABLET BY MOUTH ONCE DAILY 90 tablet 5  . nitroGLYCERIN (NITROSTAT) 0.4 MG SL tablet Place 1 tablet (0.4 mg total) under the tongue every 5 (five) minutes as needed for chest pain. 25 tablet 11  . tiotropium (SPIRIVA HANDIHALER) 18 MCG inhalation capsule INHALE THE CONTENTS OF ONE CAPSULE DAILY 90 capsule 3  . zinc gluconate 50 MG tablet Take 50 mg by mouth daily.    Marland Kitchen venlafaxine XR (EFFEXOR-XR) 150 MG 24 hr capsule Take 150 mg by mouth daily with breakfast.     No facility-administered medications prior to visit.     Allergies  Allergen Reactions  . Beclomethasone Dipropionate Hives and Other (See Comments)     weight gain  . Budesonide-Formoterol Fumarate Hives  . Mometasone Furo-Formoterol Fum Hives and Other (See Comments)    weight gain  . Sulfonamide Derivatives Hives and Rash  . Pulmicort [Budesonide]     Headaches   . Statins     Myalgias, RLS    Review of Systems   Constitutional: Negative for fever and malaise/fatigue.  HENT: Negative for congestion.   Eyes: Negative for blurred vision.  Respiratory: Negative for shortness of breath.   Cardiovascular: Negative for chest pain, palpitations and leg swelling.  Gastrointestinal: Negative for abdominal pain, blood in stool and nausea.  Genitourinary: Negative for dysuria and frequency.  Musculoskeletal: Negative for falls.  Skin: Negative for rash.  Neurological: Negative for dizziness, loss of consciousness and headaches.  Endo/Heme/Allergies: Negative for environmental allergies.  Psychiatric/Behavioral: Negative for depression. The patient is not nervous/anxious.        Objective:    Physical Exam  Constitutional: She is oriented to person, place, and time. She appears well-developed and well-nourished. No distress.  HENT:  Head: Normocephalic and atraumatic.  Nose: Nose normal.  Eyes: Right eye exhibits no discharge. Left eye exhibits no discharge.  Neck: Normal range of motion. Neck supple.  Cardiovascular: Normal rate and regular rhythm.  No murmur heard. Pulmonary/Chest: Effort normal and breath sounds normal.  Abdominal: Soft. Bowel sounds are normal. There is no tenderness.  Musculoskeletal: She exhibits no edema.  Neurological: She is alert and oriented to person, place, and time.  Skin: Skin is warm and dry.  Psychiatric: She has a normal mood and affect.  Nursing note and vitals reviewed.   BP 112/70 (BP Location: Left Arm, Patient Position: Sitting, Cuff Size: Normal)   Pulse 82   Temp 98.2 F (36.8 C) (Oral)   Resp 18   Wt 162 lb 6.4 oz (73.7 kg)   SpO2 98%   BMI 30.69 kg/m  Wt Readings from Last 3 Encounters:  04/02/18 162 lb 6.4 oz (73.7 kg)  11/27/17 161 lb 3.2 oz (73.1 kg)  11/14/17 161 lb 9.6 oz (73.3 kg)     Lab Results  Component Value Date   WBC 6.8 04/02/2018   HGB 13.0 04/02/2018   HCT 39.0 04/02/2018   PLT 298.0 04/02/2018   GLUCOSE 93 04/02/2018     CHOL 150 04/02/2018   TRIG 132.0 04/02/2018   HDL 54.50 04/02/2018   LDLDIRECT 177.8 09/07/2011   LDLCALC 69 04/02/2018   ALT 19 04/02/2018   AST 15 04/02/2018   NA 139 04/02/2018   K 4.1 04/02/2018   CL 103 04/02/2018   CREATININE 0.62 04/02/2018   BUN 16 04/02/2018   CO2 31 04/02/2018   TSH 1.51 04/02/2018   INR 1.0 08/21/2017   HGBA1C 5.5 04/02/2018    Lab Results  Component Value Date   TSH 1.51 04/02/2018   Lab Results  Component Value Date   WBC 6.8 04/02/2018   HGB 13.0 04/02/2018   HCT 39.0 04/02/2018   MCV 91.0 04/02/2018   PLT 298.0 04/02/2018   Lab Results  Component Value Date   NA 139 04/02/2018   K 4.1 04/02/2018   CO2 31 04/02/2018   GLUCOSE 93 04/02/2018   BUN 16 04/02/2018   CREATININE 0.62 04/02/2018   BILITOT 0.3 04/02/2018   ALKPHOS 88 04/02/2018   AST 15 04/02/2018   ALT 19 04/02/2018   PROT 6.4 04/02/2018   ALBUMIN 4.2 04/02/2018   CALCIUM 9.5 04/02/2018   ANIONGAP 6 08/24/2017   GFR 104.04 04/02/2018   Lab Results  Component Value Date   CHOL 150 04/02/2018   Lab Results  Component Value Date   HDL 54.50 04/02/2018   Lab Results  Component Value Date   LDLCALC 69 04/02/2018   Lab Results  Component Value Date   TRIG 132.0 04/02/2018   Lab Results  Component Value Date   CHOLHDL 3 04/02/2018   Lab Results  Component Value Date   HGBA1C 5.5 04/02/2018       Assessment & Plan:   Problem List Items Addressed This Visit    Depression (Chronic)    Is doing well previously on Effexor XR a 37.5 mg tab + a 75 mg tab. Then dropped to 37.5 + 75 qod and 75 mg qod. Tried to 75 mg daily but anxiety and insomnia worsened so you went back up to 37.5 + 75 daily      Relevant Medications   venlafaxine XR (EFFEXOR XR) 75 MG 24 hr capsule   venlafaxine XR (EFFEXOR-XR) 37.5 MG 24 hr capsule   Seasonal and perennial allergic rhinitis (Chronic)    No significant flare      Hyperglycemia (Chronic)    hgba1c acceptable,  minimize simple carbs. Increase exercise as tolerated. Continue current meds      Relevant Orders   Hemoglobin A1c (Completed)   Comprehensive metabolic panel (Completed)   TSH (Completed)   Hyperlipidemia, mixed    Encouraged heart healthy diet, increase exercise, avoid trans fats, consider a krill oil cap daily      Relevant Orders   Lipid panel (Completed)   TSH (Completed)   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[place])    No recent exacerbation.       Coronary artery disease due to lipid rich plaque    Is doing well symptomatically s/p her stent which she tolerated well      Muscle spasm    In low back and legs. Was  at [facility] and then she fell and her pain flared again. Now it is improving again      Low back pain    Worsened after a fall but after a course of PT she is doing better.      CAD (coronary artery disease)    Asymptomatic doing well      Relevant Orders   CBC (Completed)      I have discontinued Tenna Child. Cashatt's venlafaxine XR and venlafaxine. I have also changed her venlafaxine XR and venlafaxine XR. Additionally, I am having her maintain her Vitamin D, aspirin, fexofenadine, ipratropium-albuterol, guaiFENesin, Evolocumab, nitroGLYCERIN, tiotropium, ALPRAZolam, FASENRA, furosemide, Magnesium Oxide, zinc gluconate, Calcium-Magnesium-Vitamin D, mometasone, mometasone, montelukast, and clopidogrel.  Meds ordered this encounter  Medications  . DISCONTD: venlafaxine (EFFEXOR) 37.5 MG tablet    Sig: Take 1 tablet (37.5 mg total) by mouth daily. Take 3 tablets by mouth daily    Dispense:  90 tablet    Refill:  1  . venlafaxine XR (EFFEXOR XR) 75 MG 24 hr capsule    Sig: Take 1 capsule (75 mg total) by mouth daily.    Dispense:  90 capsule    Refill:  1  . venlafaxine XR (EFFEXOR-XR) 37.5 MG 24 hr capsule    Sig: Take 1 capsule (37.5 mg total) by mouth daily.    Dispense:  90 capsule    Refill:  1     [name], MD

## 2018-04-04 NOTE — Assessment & Plan Note (Signed)
No recent exacerbation 

## 2018-04-04 NOTE — Assessment & Plan Note (Signed)
No significant flare

## 2018-04-04 NOTE — Assessment & Plan Note (Signed)
Worsened after a fall but after a course of PT she is doing better.

## 2018-04-05 ENCOUNTER — Telehealth: Payer: Self-pay | Admitting: Internal Medicine

## 2018-04-05 DIAGNOSIS — M545 Low back pain: Secondary | ICD-10-CM | POA: Diagnosis not present

## 2018-04-05 NOTE — Telephone Encounter (Signed)
Arrival Date:04/05/18 Lot #: [id number] Exp date: 12/2018 Med was ordered while I was off.

## 2018-04-10 ENCOUNTER — Telehealth: Payer: Self-pay | Admitting: Internal Medicine

## 2018-04-10 DIAGNOSIS — M545 Low back pain: Secondary | ICD-10-CM | POA: Diagnosis not present

## 2018-04-10 NOTE — Telephone Encounter (Signed)
Information in regards to pt's injections from previous phone note 04/05/18:  Newman Nickels   04/05/18 2:02 PM  Note    Arrival Date:04/05/18 Lot #: [id number] Exp date: 12/2018 Med was ordered while I was off.    (Name not recorded)  to [place], Laurie     04/05/18 2:00 PM  Fasenra [place] & [place] Was ordered while I was off.    [name] or Tammy, please advise on this for pt as she is calling in regards to her injections. Thanks!

## 2018-04-10 NOTE — Telephone Encounter (Signed)
Called pt to sch next [facility] appt.. I lmom tcb and sch her next inj with one of the schedulers when she calls back. Reminded her to bring her epi-pen with her.

## 2018-04-10 NOTE — Telephone Encounter (Signed)
Patient called in regards to injections. Requesting a call back.

## 2018-04-10 NOTE — Telephone Encounter (Signed)
Victoria [name] please call patient and set up apt for her to get her injection. Thanks.

## 2018-04-11 NOTE — Telephone Encounter (Signed)
Pt called and sch'd next appt.. Nothing further needed.

## 2018-04-12 DIAGNOSIS — M545 Low back pain: Secondary | ICD-10-CM | POA: Diagnosis not present

## 2018-04-13 ENCOUNTER — Encounter: Payer: Self-pay | Admitting: Internal Medicine

## 2018-04-13 ENCOUNTER — Ambulatory Visit (INDEPENDENT_AMBULATORY_CARE_PROVIDER_SITE_OTHER): Payer: PPO | Admitting: Internal Medicine

## 2018-04-13 VITALS — BP 124/70 | HR 95 | Temp 97.8°F | Resp 16 | Ht 61.0 in | Wt 161.8 lb

## 2018-04-13 DIAGNOSIS — L03115 Cellulitis of right lower limb: Secondary | ICD-10-CM | POA: Diagnosis not present

## 2018-04-13 MED ORDER — MUPIROCIN 2 % EX OINT: 1.0000 "application " | TOPICAL_OINTMENT | Freq: Two times a day (BID) | CUTANEOUS | 0 refills | Status: DC

## 2018-04-13 MED ORDER — DOXYCYCLINE HYCLATE 100 MG PO TABS: 100.0000 mg | ORAL_TABLET | Freq: Two times a day (BID) | ORAL | 0 refills | Status: DC

## 2018-04-13 NOTE — Patient Instructions (Signed)
Take the antibiotics for 7 days, if you are not completely well okay to take it for a total of 10 days  Apply mupirocin twice a day until better  Warm compress twice a day is a good idea  Call if not gradually better, call if fever, chills or the area of redness increases.

## 2018-04-13 NOTE — Progress Notes (Signed)
Subjective:    Patient ID: Victoria [name], female    DOB: [birth year], 62 y.o.   MRN: [id number]  DOS:  04/13/2018 Type of visit - description : Acute Interval history: 5 days ago developed a boil at the right leg, the area is tender, red. This is not the first time this happened, previously was diagnosed with MRSA. Areas not responding to typical measures such as warm compresses and topical antibiotics.   Review of Systems No fever chills No nausea or vomiting  Past Medical History:  Diagnosis Date  . Allergic bronchopulmonary aspergillosis ([place]) 2008   sees Dr Edmund Hilda pulmonology  . Anemia    iron deficiency, resolved  . Anxiety   . Asthma   . CAD (coronary artery disease)    a. LHC 6/16:  oOM1 60, pRCA 25 >> med Rx b. cath 3/19 2nd OM with 95% stenosis s/p synergy DES & anomalous RCA  . CAP (community acquired pneumonia) 2016; 06/07/2016  . Chronic bronchitis ([place])   . Chronic lower back pain   . Complication of anesthesia    "think I have a hard time waking up from it"  . COPD (chronic obstructive pulmonary disease) ([place])   . Depression    mild  . Diverticulosis   . GERD (gastroesophageal reflux disease)   . H/O hiatal hernia   . Headache    "weekly" (08/23/2017)  . History of echocardiogram    Echo 6/16:  Mod LVH, EF 60-65%, no RWMA, Gr 1 DD, trivial MR, normal LA size.  Marland Kitchen Hyperglycemia 11/20/2015  . Hyperlipidemia, mixed 09/11/2007   Qualifier: Diagnosis of  By: Jerold Coombe   Did not tolerate Lipitor, zocor, Lovastatin, Pravastatin, Livalo, Crestor even low dose   . IBS (irritable bowel syndrome)   . Maxillary sinusitis   . Normal cardiac stress test 11/2011   No evidence of ischemia or infarct. Calculated ejection fraction 72%.  . Obesity   . OSA (obstructive sleep apnea) 02/2012   has stopped using  cpap  . Osteoarthritis   . Osteoporosis   . Pneumonia 11/2011   "before 2013 I hadn't had pneumonia since I was a child" (04/13/2012)  .  Pulmonary nodules   . S/P angioplasty with stent 08/23/17 ostial 2nd OM with DES synnergy 08/24/2017  . Schatzki's ring     Past Surgical History:  Procedure Laterality Date  . APPENDECTOMY  1989  . CARDIAC CATHETERIZATION N/A 11/25/2014   Procedure: Right/Left Heart Cath and Coronary Angiography;  Surgeon: Belva Crome, MD;  Location: [facility];  Service: Cardiovascular;  Laterality: N/A;  . CESAREAN SECTION  1985  . CORONARY ANGIOPLASTY WITH STENT PLACEMENT  08/23/2017  . CORONARY STENT INTERVENTION N/A 08/23/2017   Procedure: CORONARY STENT INTERVENTION;  Surgeon: Burnell Blanks, MD;  Location: [facility];  Service: Cardiovascular;  Laterality: N/A;  . HERNIA REPAIR  04/13/2012   VHR laparoscopic  . LEFT HEART CATH AND CORONARY ANGIOGRAPHY N/A 08/23/2017   Procedure: LEFT HEART CATH AND CORONARY ANGIOGRAPHY;  Surgeon: Burnell Blanks, MD;  Location: [facility];  Service: Cardiovascular;  Laterality: N/A;  . VENTRAL HERNIA REPAIR  04/13/2012   Procedure: LAPAROSCOPIC VENTRAL HERNIA;  Surgeon: Adin Hector, MD;  Location: [facility];  Service: General;  Laterality: N/A;  laparoscopic repair of incarcerated hernia    Social History   Socioeconomic History  . Marital status: Married    Spouse name: Not on file  . Number  of children: 1  . Years of education: Not on file  . Highest education level: Not on file  Occupational History    Employer: UNEMPLOYED  Social Needs  . Financial resource strain: Not on file  . Food insecurity:    Worry: Not on file    Inability: Not on file  . Transportation needs:    Medical: Not on file    Non-medical: Not on file  Tobacco Use  . Smoking status: Never Smoker  . Smokeless tobacco: Never Used  Substance and Sexual Activity  . Alcohol use: Yes    Alcohol/week: 4.0 standard drinks    Types: 2 Glasses of wine, 2 Cans of beer per week  . Drug use: No  . Sexual activity: Yes    Comment: gluten free, lives  with husband and son with CP quadriplegia  Lifestyle  . Physical activity:    Days per week: Not on file    Minutes per session: Not on file  . Stress: Not on file  Relationships  . Social connections:    Talks on phone: Not on file    Gets together: Not on file    Attends religious service: Not on file    Active member of club or organization: Not on file    Attends meetings of clubs or organizations: Not on file    Relationship status: Not on file  . Intimate partner violence:    Fear of current or ex partner: Not on file    Emotionally abused: Not on file    Physically abused: Not on file    Forced sexual activity: Not on file  Other Topics Concern  . Not on file  Social History Narrative   Cares for a 21yo son with cerebral palsy.       Allergies as of 04/13/2018      Reactions   Beclomethasone Dipropionate Hives, Other (See Comments)    weight gain   Budesonide-formoterol Fumarate Hives   Mometasone Furo-formoterol Fum Hives, Other (See Comments)   weight gain   Sulfonamide Derivatives Hives, Rash   Pulmicort [budesonide]    Headaches   Statins    Myalgias, RLS      Medication List        Accurate as of 04/13/18 11:59 PM. Always use your most recent med list.          aspirin 81 MG tablet Take 81 mg by mouth daily.   Calcium-Magnesium-Vitamin D 300-150-400 MG-MG-UNIT Tabs Take 1 tablet by mouth daily.   clopidogrel 75 MG tablet Commonly known as:  PLAVIX TAKE 1 TABLET BY MOUTH DAILY WITH BREAKFAST.   doxycycline 100 MG tablet Commonly known as:  VIBRA-TABS Take 1 tablet (100 mg total) by mouth 2 (two) times daily.   Evolocumab 140 MG/ML Soaj Inject 1 pen into the skin every 14 (fourteen) days.   FASENRA 30 MG/ML Sosy Generic drug:  Benralizumab Inject 1 Syringe into the muscle every 30 (thirty) days.   ipratropium-albuterol 0.5-2.5 (3) MG/3ML Soln Commonly known as:  DUONEB INHALE THE CONTENTS OF 1 VIAL VIA NEBULIZER UP TO 4 TO 6 TIMES A  DAY.   Magnesium Oxide 250 MG Tabs Take 250 mg by mouth daily.   mometasone 220 MCG/INH inhaler Commonly known as:  ASMANEX Inhale 2 puffs into the lungs 2 (two) times daily.   montelukast 10 MG tablet Commonly known as:  SINGULAIR TAKE 1 TABLET BY MOUTH ONCE DAILY   mupirocin ointment 2 % Commonly known as:  BACTROBAN Place 1 application into the nose 2 (two) times daily.   nitroGLYCERIN 0.4 MG SL tablet Commonly known as:  NITROSTAT Place 1 tablet (0.4 mg total) under the tongue every 5 (five) minutes as needed for chest pain.   tiotropium 18 MCG inhalation capsule Commonly known as:  SPIRIVA INHALE THE CONTENTS OF ONE CAPSULE DAILY   venlafaxine XR 75 MG 24 hr capsule Commonly known as:  EFFEXOR-XR Take 1 capsule (75 mg total) by mouth daily.   venlafaxine XR 37.5 MG 24 hr capsule Commonly known as:  EFFEXOR-XR Take 1 capsule (37.5 mg total) by mouth daily.   Vitamin D 2000 units Caps Take 2,000 Units by mouth daily.   zinc gluconate 50 MG tablet Take 50 mg by mouth daily.          Objective:   Physical Exam BP 124/70 (BP Location: Right Arm, Patient Position: Sitting, Cuff Size: Normal)   Pulse 95   Temp 97.8 F (36.6 C) (Oral)   Resp 16   Ht 5\' 1"  (1.549 m)   Wt 161 lb 12.8 oz (73.4 kg)   SpO2 97%   BMI 30.57 kg/m  General:   Well developed, NAD, see BMI.  HEENT:  Normocephalic . Face symmetric, atraumatic Skin: See picture, right calf: Has a area of redness, there is a small opening but no discharge, no fluctuance.  Area around the redness is a slightly indurated  Neurologic:  alert & oriented X3.  Speech normal, gait appropriate for age and unassisted Psych--  Cognition and judgment appear intact.  Cooperative with normal attention span and concentration.  Behavior appropriate. No anxious or depressed appearing.        Assessment & Plan:   62 year old female, PMH includes CAD, CHF, OSA, asthma, on multiple meds, presents  with Cellulitis: Findings consistent with cellulitis, no abscess that I can tell.  Otherwise the calf is normal. History of MRSA before. Nothing to be culture. Plan: Bactroban, doxycycline, call if not improving.  See AVS

## 2018-04-16 ENCOUNTER — Ambulatory Visit: Payer: PPO | Admitting: Cardiology

## 2018-04-16 ENCOUNTER — Telehealth: Payer: Self-pay | Admitting: Cardiology

## 2018-04-16 ENCOUNTER — Encounter: Payer: Self-pay | Admitting: Cardiology

## 2018-04-16 VITALS — BP 128/80 | HR 69 | Ht 61.0 in | Wt 160.8 lb

## 2018-04-16 DIAGNOSIS — R079 Chest pain, unspecified: Secondary | ICD-10-CM | POA: Diagnosis not present

## 2018-04-16 DIAGNOSIS — I2510 Atherosclerotic heart disease of native coronary artery without angina pectoris: Secondary | ICD-10-CM

## 2018-04-16 DIAGNOSIS — Z9861 Coronary angioplasty status: Secondary | ICD-10-CM

## 2018-04-16 NOTE — Progress Notes (Signed)
04/16/2018 BRYTON WAIGHT   June 14, 1956  [id number]  Primary Physician Mosie Lukes, MD Primary Cardiologist: Dr. Meda Coffee   Reason for Visit/CC: Add on for CP  HPI:  Victoria [name] is a 62 y.o. female who is being seen today for CP. She is followed by Dr. Meda Coffee has has a h/o CAD. She underwent cardiac catheterization for CP in March of this year which showed single vessel obstructive CAD with severe stenosis in the ostium of the large obtuse marginal branch. This was treated with successful PTCA/DES x1. LVEF was normal. She also has a h/o GERD and hiatal hernia. She was previously on omeprazole, which was controlling her reflux, but this was discontinued in March after she got her stent and was placed on DAPT w/ ASA and Plavix. She was switched to Protonix but did not tolerate this due to side effects. She has since been using a OTC H2 blocker, Zantac, even despite the recall. She also has HLD which is controlled with Repatha. Recent lipid panel showed LDL at 69 mg/dL.  She presents to clinic with new complaints of chest pain which started last week.  The chest pain is likely different from the angina she experienced prior to undergoing stent placement in March.  Her pain at that time was more of a chest tightness/squeezing sensation.  Her new chest pain has felt more like burning /indigestion but no relationship with meals. Does not radiate. It has occurred several times off and on and last a couple minutes at a time.  Some occasional relief with belching. No associated dyspnea. Does not seem to be worsened by exertion.  She reports full medication compliance with aspirin and Plavix.  She tried taking sublingual nitroglycerin yesterday without relief.  It did cause a headache.   Current Meds  Medication Sig  . aspirin 81 MG tablet Take 81 mg by mouth daily.   . Calcium-Magnesium-Vitamin D 300-150-400 MG-MG-UNIT TABS Take 1 tablet by mouth daily.  . Cholecalciferol (VITAMIN D) 2000 UNITS CAPS  Take 2,000 Units by mouth daily.   . clopidogrel (PLAVIX) 75 MG tablet TAKE 1 TABLET BY MOUTH DAILY WITH BREAKFAST.  Marland Kitchen doxycycline (VIBRA-TABS) 100 MG tablet Take 1 tablet (100 mg total) by mouth 2 (two) times daily.  . Evolocumab (REPATHA SURECLICK) 195 MG/ML SOAJ Inject 1 pen into the skin every 14 (fourteen) days.  Marland Kitchen FASENRA 30 MG/ML SOSY Inject 1 Syringe into the muscle every 30 (thirty) days.  Marland Kitchen ipratropium-albuterol (DUONEB) 0.5-2.5 (3) MG/3ML SOLN INHALE THE CONTENTS OF 1 VIAL VIA NEBULIZER UP TO 4 TO 6 TIMES A DAY.  . Magnesium Oxide 250 MG TABS Take 250 mg by mouth daily.  . mometasone (ASMANEX 60 METERED DOSES) 220 MCG/INH inhaler Inhale 2 puffs into the lungs 2 (two) times daily.  . montelukast (SINGULAIR) 10 MG tablet TAKE 1 TABLET BY MOUTH ONCE DAILY  . mupirocin ointment (BACTROBAN) 2 % Place 1 application into the nose 2 (two) times daily.  . nitroGLYCERIN (NITROSTAT) 0.4 MG SL tablet Place 1 tablet (0.4 mg total) under the tongue every 5 (five) minutes as needed for chest pain.  Marland Kitchen tiotropium (SPIRIVA HANDIHALER) 18 MCG inhalation capsule INHALE THE CONTENTS OF ONE CAPSULE DAILY  . venlafaxine XR (EFFEXOR XR) 75 MG 24 hr capsule Take 1 capsule (75 mg total) by mouth daily.  Marland Kitchen venlafaxine XR (EFFEXOR-XR) 37.5 MG 24 hr capsule Take 1 capsule (37.5 mg total) by mouth daily.  Marland Kitchen zinc gluconate 50 MG tablet Take  50 mg by mouth daily.   Allergies  Allergen Reactions  . Beclomethasone Dipropionate Hives and Other (See Comments)     weight gain  . Budesonide-Formoterol Fumarate Hives  . Mometasone Furo-Formoterol Fum Hives and Other (See Comments)    weight gain  . Sulfonamide Derivatives Hives and Rash  . Pulmicort [Budesonide]     Headaches   . Statins     Myalgias, RLS   Past Medical History:  Diagnosis Date  . 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 Anemia    iron deficiency, resolved  . Anxiety   . Asthma   . CAD  (coronary artery disease)    a. LHC 6/16:  oOM1 60, pRCA 25 >> med Rx b. cath 3/19 2nd OM with 95% stenosis s/p synergy DES & anomalous RCA  . CAP (community acquired pneumonia) 2016; 06/07/2016  . Chronic bronchitis ([place])   . Chronic lower back pain   . Complication of anesthesia    "think I have a hard time waking up from it"  . COPD (chronic obstructive pulmonary disease) ([place])   . Depression    mild  . Diverticulosis   . GERD (gastroesophageal reflux disease)   . H/O hiatal hernia   . Headache    "weekly" (08/23/2017)  . History of echocardiogram    Echo 6/16:  Mod LVH, EF 60-65%, no RWMA, Gr 1 DD, trivial MR, normal LA size.  Marland Kitchen Hyperglycemia 11/20/2015  . Hyperlipidemia, mixed 09/11/2007   Qualifier: Diagnosis of  By: Jerold Coombe   Did not tolerate Lipitor, zocor, Lovastatin, Pravastatin, Livalo, Crestor even low dose   . IBS (irritable bowel syndrome)   . Maxillary sinusitis   . Normal cardiac stress test 11/2011   No evidence of ischemia or infarct. Calculated ejection fraction 72%.  . Obesity   . OSA (obstructive sleep apnea) 02/2012   has stopped using  cpap  . Osteoarthritis   . Osteoporosis   . Pneumonia 11/2011   "before 2013 I hadn't had pneumonia since I was a child" (04/13/2012)  . Pulmonary nodules   . S/P angioplasty with stent 08/23/17 ostial 2nd OM with DES synnergy 08/24/2017  . Schatzki's ring    Family History  Problem Relation Age of Onset  . Breast cancer Mother   . Hypertension Mother   . Diabetes Mother   . Diverticulosis Father   . Prostate cancer Father   . Pulmonary embolism Brother        recurrent  . Heart attack Maternal Grandfather   . Breast cancer Sister   . Stroke Neg Hx    Past Surgical History:  Procedure Laterality Date  . APPENDECTOMY  1989  . CARDIAC CATHETERIZATION N/A 11/25/2014   Procedure: Right/Left Heart Cath and Coronary Angiography;  Surgeon: Belva Crome, MD;  Location: [facility];  Service: Cardiovascular;   Laterality: N/A;  . CESAREAN SECTION  1985  . CORONARY ANGIOPLASTY WITH STENT PLACEMENT  08/23/2017  . CORONARY STENT INTERVENTION N/A 08/23/2017   Procedure: CORONARY STENT INTERVENTION;  Surgeon: Burnell Blanks, MD;  Location: [facility];  Service: Cardiovascular;  Laterality: N/A;  . HERNIA REPAIR  04/13/2012   VHR laparoscopic  . LEFT HEART CATH AND CORONARY ANGIOGRAPHY N/A 08/23/2017   Procedure: LEFT HEART CATH AND CORONARY ANGIOGRAPHY;  Surgeon: Burnell Blanks, MD;  Location: [place] [facility];  Service: Cardiovascular;  Laterality: N/A;  . VENTRAL HERNIA REPAIR  04/13/2012   Procedure: LAPAROSCOPIC VENTRAL HERNIA;  Surgeon: Adin Hector, MD;  Location: [place];  Service: General;  Laterality: N/A;  laparoscopic repair of incarcerated hernia   Social History   Socioeconomic History  . Marital status: Married    Spouse name: Not on file  . Number of children: 1  . Years of education: Not on file  . Highest education level: Not on file  Occupational History    Employer: UNEMPLOYED  Social Needs  . Financial resource strain: Not on file  . Food insecurity:    Worry: Not on file    Inability: Not on file  . Transportation needs:    Medical: Not on file    Non-medical: Not on file  Tobacco Use  . Smoking status: Never Smoker  . Smokeless tobacco: Never Used  Substance and Sexual Activity  . Alcohol use: Yes    Alcohol/week: 4.0 standard drinks    Types: 2 Glasses of wine, 2 Cans of beer per week  . Drug use: No  . Sexual activity: Yes    Comment: gluten free, lives with husband and son with CP quadriplegia  Lifestyle  . Physical activity:    Days per week: Not on file    Minutes per session: Not on file  . Stress: Not on file  Relationships  . Social connections:    Talks on phone: Not on file    Gets together: Not on file    Attends religious service: Not on file    Active member of club or organization: Not on file    Attends meetings of  clubs or organizations: Not on file    Relationship status: Not on file  . Intimate partner violence:    Fear of current or ex partner: Not on file    Emotionally abused: Not on file    Physically abused: Not on file    Forced sexual activity: Not on file  Other Topics Concern  . Not on file  Social History Narrative   Cares for a 12yo son with cerebral palsy.      Review of Systems: General: negative for chills, fever, night sweats or weight changes.  Cardiovascular: negative for chest pain, dyspnea on exertion, edema, orthopnea, palpitations, paroxysmal nocturnal dyspnea or shortness of breath Dermatological: negative for rash Respiratory: negative for cough or wheezing Urologic: negative for hematuria Abdominal: negative for nausea, vomiting, diarrhea, bright red blood per rectum, melena, or hematemesis Neurologic: negative for visual changes, syncope, or dizziness All other systems reviewed and are otherwise negative except as noted above.   Physical Exam:  Blood pressure 128/80, pulse 69, height 5\' 1"  (1.549 m), weight 160 lb 12.8 oz (72.9 kg), SpO2 99 %.  General appearance: alert, cooperative and no distress Neck: no carotid bruit and no JVD Lungs: clear to auscultation bilaterally Heart: regular rate and rhythm, S1, S2 normal, no murmur, click, rub or gallop Extremities: extremities normal, atraumatic, no cyanosis or edema Pulses: 2+ and symmetric Skin: Skin color, texture, turgor normal. No rashes or lesions Neurologic: Grossly normal  EKG NSR 69 bpm, no ischemic abnormalities -- personally reviewed   ASSESSMENT AND PLAN:   1. CAD/ Chest Pain: status post cardiac catheterization March 2019 showing severe single-vessel CAD with severe stenosis in the ostium of the large obtuse marginal branch.  This was successfully treated with PTCA/DES x1 to the ostium of the OM.  Her recent chest discomfort is slightly different in character from her angina she had prior  to  undergoing stent placement.  She does have a history of GERD/hiatal hernia and was taken off her PPI (omeprazole) in March when Plavix was initiated.  She was switched to pantoprazole however had side effects and had to discontinue it.  She has since been only taking an over-the-counter H2 blocker which does not seem to be helping with her recent symptoms.  I feel that her chest discomfort is likely GI related/indigestion, however we do need to rule out ischemic etiology given her recent stent placement.  We will have her undergo an exercise Myoview to rule out ischemia but will also adjust her reflux medication regimen.  We will have her stop Zantac and will try Pepcid.  She has follow-up with Dr. Meda Coffee in 5 weeks.  She will keep this appointment.  If her stress test is abnormal we will have her follow-up sooner.  Chanan Detwiler Ladoris Gene, MHS [facility] 04/16/2018 11:43 AM

## 2018-04-16 NOTE — Telephone Encounter (Signed)
Pt calling in to schedule an appt with an APP today, for complaints of chest pressure and indigestion since last Friday.  Pt states that her complaints are intermittent in nature.  Pt states that she mostly has this indigestion type discomfort in her chest, that is unrelieved with antacids.  Pt reports NO complaints of sob, DOE, dizziness, N/V, pre-syncopal or syncopal issues.  Pt states her BP is up for her at 135/85 and HR-110-115 bpm.  Pt states her BP usually runs 600 systolic.  Pt states she had a stent placed this past March, so with these symptoms, she would feel reassured if she could see someone today.  Scheduled the pt an appt with Ellen Henri PA-C for today at 1130.  Advised the pt to arrive 15 mins prior to this appt. Pt verbalized understanding and agrees with this plan. Will send this message to both Dr Meda Coffee and Ellen Henri PA-C, to endorse current plan.

## 2018-04-16 NOTE — Patient Instructions (Addendum)
Medication Instructions:  STOP ZANTAC START Famotidine (PEPCID) OTC  If you need a refill on your cardiac medications before your next appointment, please call your pharmacy.   Lab work: NONE If you have labs (blood work) drawn today and your tests are completely normal, you will receive your results only by: Marland Kitchen MyChart Message (if you have MyChart) OR . A paper copy in the mail If you have any lab test that is abnormal or we need to change your treatment, we will call you to review the results.  Testing/Procedures:  Please schedule Your physician has requested that you have en exercise stress myoview. For further information please visit [email or web address]. Please follow instruction sheet, as given.    Follow-Up: Dr Meda Coffee in December At [facility], you and your health needs are our priority.  As part of our continuing mission to provide you with exceptional heart care, we have created designated Provider Care Teams.  These Care Teams include your primary Cardiologist (physician) and Advanced Practice Providers (APPs -  Physician Assistants and Nurse Practitioners) who all work together to provide you with the care you need, when you need it.  Any Other Special Instructions Will Be Listed Below (If Applicable).

## 2018-04-16 NOTE — Telephone Encounter (Signed)
New message:       Pt c/o BP issue: STAT if pt c/o blurred vision, one-sided weakness or slurred speech  1. What are your last 5 BP readings? 135/85 pt states her normal range is 110 or 115  2. Are you having any other symptoms (ex. Dizziness, headache, blurred vision, passed out)? No  3. What is your BP issue? Pt states she is having some indigestion and some pressure in her chest and she went to see her PCP but thinks it may be a issue with her stents she had put in in March of this year.

## 2018-04-18 ENCOUNTER — Telehealth: Payer: Self-pay | Admitting: Internal Medicine

## 2018-04-18 NOTE — Telephone Encounter (Signed)
This is a question for triage and or MR. Pt wants to know if she can still take her Fasenra inj. 04/19/18 even though she is on an ABX. Sending to triage, please look into to this for the pt.Marland Kitchen

## 2018-04-18 NOTE — Telephone Encounter (Signed)
Attempted to call patient today regarding regarding message below. I did not receive an answer at time of call. I have left a voicemail message for pt to return call. X1  MR please advise if patient should still take her Fasenra injection while on abx? She is scheduled for her injection tomorrow 04/19/2018

## 2018-04-19 ENCOUNTER — Ambulatory Visit: Payer: PPO

## 2018-04-19 DIAGNOSIS — M545 Low back pain: Secondary | ICD-10-CM | POA: Diagnosis not present

## 2018-04-19 NOTE — Telephone Encounter (Signed)
Spoke with patient. She is aware that she is ok to take the abx and the injection. Patient had to reschedule her injection until 11/5 due to her not receiving a call back yesterday. She is aware of her new appt.   Nothing further needed at time of call.

## 2018-04-19 NOTE — Telephone Encounter (Signed)
Should be ok to take abx and fasenra at same time unless she is  Feeling very miserable

## 2018-04-20 DIAGNOSIS — M545 Low back pain: Secondary | ICD-10-CM | POA: Diagnosis not present

## 2018-04-20 DIAGNOSIS — M4316 Spondylolisthesis, lumbar region: Secondary | ICD-10-CM | POA: Diagnosis not present

## 2018-04-20 DIAGNOSIS — M4125 Other idiopathic scoliosis, thoracolumbar region: Secondary | ICD-10-CM | POA: Diagnosis not present

## 2018-04-23 DIAGNOSIS — M545 Low back pain: Secondary | ICD-10-CM | POA: Diagnosis not present

## 2018-04-24 ENCOUNTER — Ambulatory Visit: Payer: PPO

## 2018-04-24 ENCOUNTER — Other Ambulatory Visit: Payer: Self-pay | Admitting: Orthopedic Surgery

## 2018-04-24 DIAGNOSIS — M4316 Spondylolisthesis, lumbar region: Secondary | ICD-10-CM

## 2018-04-24 DIAGNOSIS — M545 Low back pain, unspecified: Secondary | ICD-10-CM

## 2018-04-24 DIAGNOSIS — M4125 Other idiopathic scoliosis, thoracolumbar region: Secondary | ICD-10-CM

## 2018-04-25 ENCOUNTER — Encounter (HOSPITAL_COMMUNITY): Payer: PPO

## 2018-04-26 ENCOUNTER — Ambulatory Visit (INDEPENDENT_AMBULATORY_CARE_PROVIDER_SITE_OTHER): Payer: PPO

## 2018-04-26 DIAGNOSIS — J8283 Eosinophilic asthma: Secondary | ICD-10-CM

## 2018-04-26 DIAGNOSIS — J82 Pulmonary eosinophilia, not elsewhere classified: Secondary | ICD-10-CM | POA: Diagnosis not present

## 2018-04-26 DIAGNOSIS — M545 Low back pain: Secondary | ICD-10-CM | POA: Diagnosis not present

## 2018-04-26 MED ORDER — BENRALIZUMAB 30 MG/ML ~~LOC~~ SOSY
30.0000 mg | PREFILLED_SYRINGE | Freq: Once | SUBCUTANEOUS | Status: AC
  Administered 2018-04-26: 30 mg via SUBCUTANEOUS

## 2018-04-27 ENCOUNTER — Other Ambulatory Visit: Payer: PPO

## 2018-04-30 ENCOUNTER — Ambulatory Visit
Admission: RE | Admit: 2018-04-30 | Discharge: 2018-04-30 | Disposition: A | Discharge status: Home / Self Care | Payer: PPO | Source: Ambulatory Visit | Attending: Orthopedic Surgery | Admitting: Orthopedic Surgery

## 2018-04-30 DIAGNOSIS — M545 Low back pain, unspecified: Secondary | ICD-10-CM

## 2018-04-30 DIAGNOSIS — M4125 Other idiopathic scoliosis, thoracolumbar region: Secondary | ICD-10-CM

## 2018-04-30 DIAGNOSIS — M5124 Other intervertebral disc displacement, thoracic region: Secondary | ICD-10-CM | POA: Diagnosis not present

## 2018-04-30 DIAGNOSIS — M47814 Spondylosis without myelopathy or radiculopathy, thoracic region: Secondary | ICD-10-CM | POA: Diagnosis not present

## 2018-04-30 DIAGNOSIS — M47816 Spondylosis without myelopathy or radiculopathy, lumbar region: Secondary | ICD-10-CM | POA: Diagnosis not present

## 2018-04-30 DIAGNOSIS — M48061 Spinal stenosis, lumbar region without neurogenic claudication: Secondary | ICD-10-CM | POA: Diagnosis not present

## 2018-04-30 DIAGNOSIS — M4316 Spondylolisthesis, lumbar region: Secondary | ICD-10-CM

## 2018-04-30 DIAGNOSIS — M4314 Spondylolisthesis, thoracic region: Secondary | ICD-10-CM | POA: Diagnosis not present

## 2018-05-08 ENCOUNTER — Encounter: Payer: Self-pay | Admitting: Internal Medicine

## 2018-05-08 ENCOUNTER — Ambulatory Visit (INDEPENDENT_AMBULATORY_CARE_PROVIDER_SITE_OTHER): Payer: PPO | Admitting: Internal Medicine

## 2018-05-08 VITALS — BP 124/66 | HR 84 | Temp 98.0°F | Resp 16 | Ht 61.0 in | Wt 161.4 lb

## 2018-05-08 DIAGNOSIS — L080 Pyoderma: Secondary | ICD-10-CM

## 2018-05-08 MED ORDER — CIPROFLOXACIN HCL 500 MG PO TABS: 500.0000 mg | ORAL_TABLET | Freq: Two times a day (BID) | ORAL | 0 refills | Status: DC

## 2018-05-08 NOTE — Progress Notes (Signed)
Pre visit review using our clinic review tool, if applicable. No additional management support is needed unless otherwise documented below in the visit note. 

## 2018-05-08 NOTE — Patient Instructions (Addendum)
Take antibiotics as prescribed  Call if fever chills, more lesions  Next visit 10 days   Suspect ecthyma gangrenosum versus pyoderma gangrenosum vs others

## 2018-05-08 NOTE — Progress Notes (Addendum)
Subjective:    Patient ID: Victoria [name], female    DOB: 01/17/1956, 62 y.o.   MRN: [id number]  DOS:  05/08/2018 Type of visit - description : acute See last visit, was treated empirically with doxycycline for a MRSA infex She actually did not get better, she showed me several pictures on the area become a ulcer. Is on the last few days that is finally  getting better.  6 days ago, developed 2 additional lesions on the left abdomen. They are not pruritic or hurting.  Review of Systems  Denies fever chills.  Past Medical History:  Diagnosis Date  . 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 Anemia    iron deficiency, resolved  . Anxiety   . Asthma   . CAD (coronary artery disease)    a. LHC 6/16:  oOM1 60, pRCA 25 >> med Rx b. cath 3/19 2nd OM with 95% stenosis s/p synergy DES & anomalous RCA  . CAP (community acquired pneumonia) 2016; 06/07/2016  . Chronic bronchitis ([place])   . Chronic lower back pain   . Complication of anesthesia    "think I have a hard time waking up from it"  . COPD (chronic obstructive pulmonary disease) ([place])   . Depression    mild  . Diverticulosis   . GERD (gastroesophageal reflux disease)   . H/O hiatal hernia   . Headache    "weekly" (08/23/2017)  . History of echocardiogram    Echo 6/16:  Mod LVH, EF 60-65%, no RWMA, Gr 1 DD, trivial MR, normal LA size.  Marland Kitchen Hyperglycemia 11/20/2015  . Hyperlipidemia, mixed 09/11/2007   Qualifier: Diagnosis of  By: Jerold Coombe   Did not tolerate Lipitor, zocor, Lovastatin, Pravastatin, Livalo, Crestor even low dose   . IBS (irritable bowel syndrome)   . Maxillary sinusitis   . Normal cardiac stress test 11/2011   No evidence of ischemia or infarct. Calculated ejection fraction 72%.  . Obesity   . OSA (obstructive sleep apnea) 02/2012   has stopped using  cpap  . Osteoarthritis   . Osteoporosis   . Pneumonia 11/2011   "before 2013 I hadn't had  pneumonia since I was a child" (04/13/2012)  . Pulmonary nodules   . S/P angioplasty with stent 08/23/17 ostial 2nd OM with DES synnergy 08/24/2017  . Schatzki's ring     Past Surgical History:  Procedure Laterality Date  . APPENDECTOMY  1989  . CARDIAC CATHETERIZATION N/A 11/25/2014   Procedure: Right/Left Heart Cath and Coronary Angiography;  Surgeon: Belva Crome, MD;  Location: [place] [facility];  Service: Cardiovascular;  Laterality: N/A;  . CESAREAN SECTION  1985  . CORONARY ANGIOPLASTY WITH STENT PLACEMENT  08/23/2017  . CORONARY STENT INTERVENTION N/A 08/23/2017   Procedure: CORONARY STENT INTERVENTION;  Surgeon: Burnell Blanks, MD;  Location: [place] [facility];  Service: Cardiovascular;  Laterality: N/A;  . HERNIA REPAIR  04/13/2012   VHR laparoscopic  . LEFT HEART CATH AND CORONARY ANGIOGRAPHY N/A 08/23/2017   Procedure: LEFT HEART CATH AND CORONARY ANGIOGRAPHY;  Surgeon: Burnell Blanks, MD;  Location: [place] [facility];  Service: Cardiovascular;  Laterality: N/A;  . VENTRAL HERNIA REPAIR  04/13/2012   Procedure: LAPAROSCOPIC VENTRAL HERNIA;  Surgeon: Adin Hector, MD;  Location: [place];  Service: General;  Laterality: N/A;  laparoscopic repair of incarcerated hernia    Social History   Socioeconomic History  . Marital status:  Married    Spouse name: Not on file  . Number of children: 1  . Years of education: Not on file  . Highest education level: Not on file  Occupational History    Employer: UNEMPLOYED  Social Needs  . Financial resource strain: Not on file  . Food insecurity:    Worry: Not on file    Inability: Not on file  . Transportation needs:    Medical: Not on file    Non-medical: Not on file  Tobacco Use  . Smoking status: Never Smoker  . Smokeless tobacco: Never Used  Substance and Sexual Activity  . Alcohol use: Yes    Alcohol/week: 4.0 standard drinks    Types: 2 Glasses of wine, 2 Cans of beer per week  . Drug use: No  . Sexual  activity: Yes    Comment: gluten free, lives with husband and son with CP quadriplegia  Lifestyle  . Physical activity:    Days per week: Not on file    Minutes per session: Not on file  . Stress: Not on file  Relationships  . Social connections:    Talks on phone: Not on file    Gets together: Not on file    Attends religious service: Not on file    Active member of club or organization: Not on file    Attends meetings of clubs or organizations: Not on file    Relationship status: Not on file  . Intimate partner violence:    Fear of current or ex partner: Not on file    Emotionally abused: Not on file    Physically abused: Not on file    Forced sexual activity: Not on file  Other Topics Concern  . Not on file  Social History Narrative   Cares for a 44yo son with cerebral palsy.       Allergies as of 05/08/2018      Reactions   Beclomethasone Dipropionate Hives, Other (See Comments)    weight gain   Budesonide-formoterol Fumarate Hives   Mometasone Furo-formoterol Fum Hives, Other (See Comments)   weight gain   Sulfonamide Derivatives Hives, Rash   Pulmicort [budesonide]    Headaches   Statins    Myalgias, RLS      Medication List        Accurate as of 05/08/18  6:24 PM. Always use your most recent med list.          aspirin 81 MG tablet Take 81 mg by mouth daily.   Calcium-Magnesium-Vitamin D 300-150-400 MG-MG-UNIT Tabs Take 1 tablet by mouth daily.   ciprofloxacin 500 MG tablet Commonly known as:  CIPRO Take 1 tablet (500 mg total) by mouth 2 (two) times daily.   clopidogrel 75 MG tablet Commonly known as:  PLAVIX TAKE 1 TABLET BY MOUTH DAILY WITH BREAKFAST.   Evolocumab 140 MG/ML Soaj Inject 1 pen into the skin every 14 (fourteen) days.   FASENRA 30 MG/ML Sosy Generic drug:  Benralizumab Inject 1 Syringe into the muscle every 30 (thirty) days.   ipratropium-albuterol 0.5-2.5 (3) MG/3ML Soln Commonly known as:  DUONEB INHALE THE CONTENTS OF 1  VIAL VIA NEBULIZER UP TO 4 TO 6 TIMES A DAY.   Magnesium Oxide 250 MG Tabs Take 250 mg by mouth daily.   mometasone 220 MCG/INH inhaler Commonly known as:  ASMANEX Inhale 2 puffs into the lungs 2 (two) times daily.   montelukast 10 MG tablet Commonly known as:  SINGULAIR TAKE 1 TABLET  BY MOUTH ONCE DAILY   mupirocin ointment 2 % Commonly known as:  BACTROBAN Place 1 application into the nose 2 (two) times daily.   nitroGLYCERIN 0.4 MG SL tablet Commonly known as:  NITROSTAT Place 1 tablet (0.4 mg total) under the tongue every 5 (five) minutes as needed for chest pain.   tiotropium 18 MCG inhalation capsule Commonly known as:  SPIRIVA INHALE THE CONTENTS OF ONE CAPSULE DAILY   venlafaxine XR 75 MG 24 hr capsule Commonly known as:  EFFEXOR-XR Take 1 capsule (75 mg total) by mouth daily.   venlafaxine XR 37.5 MG 24 hr capsule Commonly known as:  EFFEXOR-XR Take 1 capsule (37.5 mg total) by mouth daily.   Vitamin D 50 MCG (2000 UT) Caps Take 2,000 Units by mouth daily.   Zinc 30 MG Caps Take 30 mg by mouth daily.           Objective:   Physical Exam BP 124/66 (BP Location: Left Arm, Patient Position: Sitting, Cuff Size: Small)   Pulse 84   Temp 98 F (36.7 C) (Oral)   Resp 16   Ht 5\' 1"  (1.549 m)   Wt 161 lb 6 oz (73.2 kg)   SpO2 97%   BMI 30.49 kg/m  General:   Well developed, NAD, BMI noted. HEENT:  Normocephalic . Face symmetric, atraumatic Skin: see picture Neurologic:  alert & oriented X3.  Speech normal, gait appropriate for age and unassisted Psych--  Cognition and judgment appear intact.  Cooperative with normal attention span and concentration.  Behavior appropriate. No anxious or depressed appearing.          Assessment & Plan:     62 year old female, PMH includes CAD, CHF, OSA, asthma, on multiple meds, presents with  Skin lesions: Was recently seen with a single skin lesion felt to be MRSA, was treated with doxycycline, failed  to improved, it actually got worse, and after almost 3 weeks is now gradually better. The patient developed 2  additional lesions at the left side of the abdomen about 6 days ago. No systemic symptoms Suspect ecthyma gangrenosum versus pyoderma gangrenosum, side effect of medications (fasendra?  Repatha?), vs  others (distribution of the abdominal lesions suspicious for shingles but that is unlikely) . Plan: Culture one of the abdominal lesions although yield is very likely to be low or contaminated Prescribed Cipro for 10 days, pseudomonas infection? Patient will call her dermatology  and ask to be seen within the next 10 days Also recommend to discuss biologicals with pulmonary and cardiology, she has appointments soon. RTC 10 days   Today, I spent more than  26  min with the patient: >50% of the time counseling regards her skin lesion, why I suspect she may have ecthyma gangrenosum or pyoderma gangrenosum, multiple questions answer

## 2018-05-10 ENCOUNTER — Encounter: Payer: Self-pay | Admitting: Cardiology

## 2018-05-10 DIAGNOSIS — M545 Low back pain: Secondary | ICD-10-CM | POA: Diagnosis not present

## 2018-05-11 LAB — WOUND CULTURE
MICRO NUMBER: 91392488
SPECIMEN QUALITY:: ADEQUATE

## 2018-05-14 ENCOUNTER — Ambulatory Visit: Payer: PPO | Admitting: Internal Medicine

## 2018-05-14 ENCOUNTER — Ambulatory Visit (INDEPENDENT_AMBULATORY_CARE_PROVIDER_SITE_OTHER): Payer: PPO | Admitting: Internal Medicine

## 2018-05-14 ENCOUNTER — Encounter: Payer: Self-pay | Admitting: Internal Medicine

## 2018-05-14 VITALS — BP 114/78 | HR 80 | Ht 61.5 in | Wt 161.8 lb

## 2018-05-14 DIAGNOSIS — J82 Pulmonary eosinophilia, not elsewhere classified: Secondary | ICD-10-CM

## 2018-05-14 DIAGNOSIS — J4550 Severe persistent asthma, uncomplicated: Secondary | ICD-10-CM

## 2018-05-14 DIAGNOSIS — J8283 Eosinophilic asthma: Secondary | ICD-10-CM

## 2018-05-14 DIAGNOSIS — A4901 Methicillin susceptible Staphylococcus aureus infection, unspecified site: Secondary | ICD-10-CM

## 2018-05-14 DIAGNOSIS — M545 Low back pain: Secondary | ICD-10-CM | POA: Diagnosis not present

## 2018-05-14 DIAGNOSIS — B4481 Allergic bronchopulmonary aspergillosis: Secondary | ICD-10-CM | POA: Diagnosis not present

## 2018-05-14 DIAGNOSIS — K449 Diaphragmatic hernia without obstruction or gangrene: Secondary | ICD-10-CM

## 2018-05-14 LAB — NITRIC OXIDE: FeNO level (ppb): 27

## 2018-05-14 MED ORDER — MOMETASONE FUROATE 220 MCG/INH IN AEPB: 2.0000 | INHALATION_SPRAY | Freq: Two times a day (BID) | RESPIRATORY_TRACT | 12 refills | Status: DC

## 2018-05-14 MED ORDER — AMOXICILLIN-POT CLAVULANATE 500-125 MG PO TABS: 1.0000 | ORAL_TABLET | Freq: Three times a day (TID) | ORAL | 0 refills | Status: DC

## 2018-05-14 MED ORDER — IPRATROPIUM-ALBUTEROL 0.5-2.5 (3) MG/3ML IN SOLN: RESPIRATORY_TRACT | 2 refills | Status: DC

## 2018-05-14 NOTE — Patient Instructions (Signed)
Severe persistent asthma with intensive monitoring - Plan: Nitric oxide 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HCC) A B P A-ALLERGIC BRONCHOPULMONARY ASPERGILLOSIS  - glad doing well - asthma well controlled with FASENRA - continue same - stop spiriva - stop singulair  - continue asmanex daily as scheduled- go back on this. Not time to stop it (yet)  - take enough for 6 weeks   MSSA (methicillin susceptible Staphylococcus aureus) infection   - per pcp office   Hiatal hernia  - please revisit with Dr Zenovia Jarred  Followup 4 months or sooner if needed

## 2018-05-14 NOTE — Addendum Note (Signed)
Addended by: Karmen Stabs on: 05/14/2018 11:33 AM   Modules accepted: Orders

## 2018-05-14 NOTE — Progress Notes (Signed)
OV 10/06/2016  Chief Complaint  Patient presents with  . Follow-up    Pt states her asthma is good; had one flare up since last visit. SOB with exertion, cough with yellow to brown mucus prodcution, with frequent wheezing. Notice improvement with being on [employer]      62 year old female with severe asthma with ABPA and elevated IgE and eosinophilia. She is on IL5RAb antibody treatment now for the last few months. She feels this is helping her significantly. Her symptoms course of better. She is on Asmanex, Spiriva, Singulair as well. She is not on daily prednisone anymore. In December 2017 should she did grow MRSA from sputum culture. Currently a few days ago she developed sore throat and having some sputum production. She is worried this might be MRSA. Also asthma itself is stable and well controlled according to history. She is wondering about getting a sputum culture and really wishes for it    OV 01/11/2017  Chief Complaint  Patient presents with  . Follow-up    Pt states her breathing is doing well. Pt states the last couple days she has had more wheezing. Pt states she has noticed an improvement being on Cinqair.    62 year old female with severe asthma with ABPA and elevated IgE and eosinophilia. She is on IL5RAb antibody treatment.  She is on Asmanex, Spiriva, Singulair as well. She is not on daily prednisone anymore   01/11/2017 - Here for routine follow-up. She is on interleukin-5 receptor antibody IV infusion therapy. This was held a few weeks ago because of asthma exacerbation. She says approximately on Father's Day 2018 he started feeling unwell and then ended up with an exacerbation for which she needed antibiotics and prednisone. She is surprised by this decompensation despite being on this biologic immunotherapy for asthma. She is compliant with her Asmanex, Spiriva and Singulair. She is not on prednisone. Currently she feels baseline. Patient infusion #6 or 7 is due for  tomorrow [interleukin-5 receptor antibody). Overall she feels improved quality of life. Despite all this exhaled nitric oxide continues to be HIGH; unclear why   OV 04/21/2017  Chief Complaint  Patient presents with  . Follow-up    Pt states that she has been doing good. Has had no flare-ups with her asthma. Mild coughing at night. Denies any SOB or CP.  severe asthma with ABPA and elevated IgE and eosinophilia. She is on IL5RAb antibody treatment.  She is on Asmanex, Spiriva, Singulair as well. She is not on daily prednisone anymore  Continues cinqair spiriva, singulair, and asmanex. At this point in time the interleukin-5 receptor antibody is working really well for her. For nitric oxide today is lowest ever.. In terms of his symptoms she hardly ever gets up at night. When she wakes up she does not have any symptoms. She only has slight limitation with physical activity. She's not shortness of breath because of asthma not wheezing and not using albuterol for rescue use. The asthma control score is 0.8. However she is extremely frustrated by the [facility] gravida cycle management of her interleukin-5 receptor antibody infusions. She is getting different bills wrong bills missed bills. Therefore she wants to switch to subcutaneous injectable therapy. She is very upset about the quality of care that she has received at the infusion center because of billing practices.  OV 10/19/2017  Chief Complaint  Patient presents with  . Follow-up    PFT done today. Pt states she has  been doing well. Has had a little more coughing and wheezing recently and thinks it could be due to the weather. Pt just got started back on Fasenra injections.   Severe persistent asthma with ABPA elevated IgE and eosinophilia. S/P cinqair interleukin-5 receptor antibody treatment ending" 2018 with good response but stopped secondary to billing errors at [facility] infusion center. Based on medical need then switched  to anti-eosinophil interleukin-5 receptor antibody subcutaneous injection treatment Fasenra early 2019. She is also on baseline Asmanex, Spiriva, Singulair and antihistamine 8. She is not on daily prednisone anymore.  Last seen November 2018. Overall she was doing well with good effort tolerance but in March 2019 had some atypical chest painand ended up with a coronary artery stent in the abuse marginal. She is improved from that. She is wondering about taking a beta blocker safely. She does not feel that his anemia because of blood pressure is low but apparently cardiology is asked her to clear this with me. In the interim her IV injection interleukin-5 receptor antibody has been changed to subcutaneous injection FASENRA . Because of her cardiac issues she's only taken 3 doses since January 2019. She hopes to go back on schedule with this. She had billing issues at the [facility] infusion center with the IV infusion and that is why we switched to the subcutaneous injection. However, it looks like the workflow authorization for this was not done correctly and she isn't of the large co-pay. She is unhappy about this and routinely discussed this with the patient. In terms of asthma she feels her cough is decompensated in the last few weeks and this correlates with her not taking her Asmanex. Her lung function test is near normal today. Her exhaled nitric oxide is slightly elevated than the lowest that she's had but still better than her baseline average. Currently she is waking up at nightcoughing because of the spring pollen but only hardly. Very mild symptoms when she wakes up she is slightly limited in the daytime and she experiences shortness of breath little of the time not using albuterol for rescue.    Results for YAELIS, SCHARFENBERG (MRN [id number]) as of 10/19/2017 11:51  Ref. Range 10/19/2017 10:47  FEV1-Post Latest Units: L 1.80  FEV1-%Pred-Post Latest Units: % 76  FEV1-%Change-Post Latest Units: % 0    Results for TATIA, PETRUCCI (MRN [id number]) as of 10/19/2017 11:51  Ref. Range 10/19/2017 10:47  TLC Latest Units: L 4.90  TLC % pred Latest Units: % 103  Results for LOUVINIA, CUMBO (MRN [id number]) as of 10/19/2017 11:51  Ref. Range 10/19/2017 10:47  DLCO unc Latest Units: ml/min/mmHg 19.51  DLCO unc % pred Latest Units: % 90    OV 05/14/2018  Subjective:  Patient ID: [name], female , DOB: 1956-01-08 , age 66 y.o. , MRN: [id number] , ADDRESS: [place] [id number]   05/14/2018 -   Chief Complaint  Patient presents with  . Follow-up    breathing good, SOB on exertion   Severe persistent asthma with ABPA elevated IgE and eosinophilia. S/P cinqair interleukin-5 receptor antibody treatment ending" 2018 with good response but stopped secondary to billing errors at [name] infusion center. Based on medical need then switched to anti-eosinophil interleukin-5 receptor antibody subcutaneous injection treatment Fasenra early 2019. She is also on baseline Asmanex, Spiriva, Singulair and antihistamine 8. She is not on daily prednisone anymore.  Known hiatal hernia  HPI NAUREEN BENTON 62 y.o. -returns for  follow-up of severe persistent asthma.  Earlier this year we switched her to interleukin-5 receptor antibody subcutaneous injection may be [employer] called Fasenra.  With this she feels her asthma symptoms have improved dramatically.  She feels it is better than seeing care.  She still thankful to the scientist of developed this biologic.  Her exam nitric oxide is the lowest ever at 27.  She is not having any nighttime symptoms or any wheezing.  In fact she stopped her inhaled steroid partly because she was feeling well but partly also because she is in the donut hole.  However she is continuing her Spiriva and Singulair.  She is up-to-date with her flu shot.    She is dealing with skin infections.  Primary care physician notes reviewed.  There is question whether this is  being precipitated by the fact she is on to Biologics.  She thinks she had MRSA but when I reviewed the chart on May 08, 2018 shows MSSA.  Therefore she is been started on Augmentin today which she is yet to start.  She also has hiatal hernia.  She has not seen GI in a few years.  It is fairly well-controlled and she plans to see GI soon.   Results for NARYIAH, SCHLEY (MRN [id number]) as of 05/14/2018 10:52  Ref. 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), Anemia, Anxiety, Asthma, CAD (coronary artery disease), CAP (community acquired pneumonia) (2016; 06/07/2016), Chronic bronchitis ([place]), Chronic lower back pain, Complication of anesthesia, COPD (chronic obstructive pulmonary disease) ([place]), Depression, Diverticulosis, GERD (gastroesophageal reflux disease), H/O hiatal hernia, Headache, History of echocardiogram, Hyperglycemia (11/20/2015), Hyperlipidemia, mixed (09/11/2007), IBS (irritable bowel syndrome), Maxillary sinusitis, Normal cardiac stress test (11/2011), Obesity, OSA (obstructive sleep apnea) (02/2012), Osteoarthritis, Osteoporosis, Pneumonia (11/2011), Pulmonary nodules, S/P angioplasty with stent 08/23/17 ostial 2nd OM with DES synnergy (08/24/2017), and Schatzki's ring.   reports that she has never smoked. She has never used smokeless tobacco.  Past Surgical History:  Procedure Laterality Date  . APPENDECTOMY  1989  . CARDIAC CATHETERIZATION N/A 11/25/2014   Procedure: Right/Left Heart Cath and Coronary Angiography;  Surgeon: Belva Crome, MD;  Location: [place];  Service: Cardiovascular;  Laterality: N/A;  . CESAREAN SECTION  1985  . CORONARY ANGIOPLASTY WITH STENT PLACEMENT  08/23/2017  . CORONARY STENT INTERVENTION N/A 08/23/2017   Procedure: CORONARY  STENT INTERVENTION;  Surgeon: Burnell Blanks, MD;  Location: [place] [facility];  Service: Cardiovascular;  Laterality: N/A;  . HERNIA REPAIR  04/13/2012   VHR laparoscopic  . LEFT HEART CATH AND CORONARY ANGIOGRAPHY N/A 08/23/2017   Procedure: LEFT HEART CATH AND CORONARY ANGIOGRAPHY;  Surgeon: Burnell Blanks, MD;  Location: [place] [facility];  Service: Cardiovascular;  Laterality: N/A;  . VENTRAL HERNIA REPAIR  04/13/2012   Procedure: LAPAROSCOPIC VENTRAL HERNIA;  Surgeon: Adin Hector, MD;  Location: [facility];  Service: General;  Laterality: N/A;  laparoscopic repair of incarcerated hernia    Allergies  Allergen Reactions  . Beclomethasone Dipropionate Hives and Other (See Comments)     weight gain  . Budesonide-Formoterol Fumarate Hives  . Mometasone Furo-Formoterol Fum Hives and Other (See Comments)    weight gain  . Sulfonamide Derivatives Hives and Rash  . Pulmicort [  Budesonide]     Headaches   . Statins     Myalgias, RLS    Immunization History  Administered Date(s) Administered  . Influenza Split 04/20/2011  . Influenza Whole 06/06/2007, 04/15/2008, 04/02/2009, 03/29/2012  . Influenza, High Dose Seasonal PF 05/05/2015  . Influenza,inj,Quad PF,6+ Mos 05/09/2013, 03/03/2014, 05/03/2016, 04/21/2017, 04/02/2018  . Pneumococcal Conjugate-13 05/09/2013  . Pneumococcal Polysaccharide-23 05/04/2005  . Td 07/29/2009  . Tdap 03/11/2015    Family History  Problem Relation Age of Onset  . Breast cancer Mother   . Hypertension Mother   . Diabetes Mother   . Diverticulosis Father   . Prostate cancer Father   . Pulmonary embolism Brother        recurrent  . Heart attack Maternal Grandfather   . Breast cancer Sister   . Stroke Neg Hx      Current Outpatient Medications:  .  aspirin 81 MG tablet, Take 81 mg by mouth daily. , Disp: , Rfl:  .  Benralizumab (FASENRA) 30 MG/ML SOSY, Inject 1 Syringe into the skin., Disp: , Rfl:  .  Calcium-Magnesium-Vitamin  D 300-150-400 MG-MG-UNIT TABS, Take 1 tablet by mouth daily., Disp: , Rfl:  .  Cholecalciferol (VITAMIN D) 2000 UNITS CAPS, Take 2,000 Units by mouth daily. , Disp: , Rfl:  .  clopidogrel (PLAVIX) 75 MG tablet, TAKE 1 TABLET BY MOUTH DAILY WITH BREAKFAST., Disp: 30 tablet, Rfl: 6 .  Evolocumab (REPATHA SURECLICK) 630 MG/ML SOAJ, Inject 1 pen into the skin every 14 (fourteen) days., Disp: 2 pen, Rfl: 11 .  FASENRA 30 MG/ML SOSY, Inject 1 Syringe into the muscle every 30 (thirty) days., Disp: , Rfl:  .  ipratropium-albuterol (DUONEB) 0.5-2.5 (3) MG/3ML SOLN, INHALE THE CONTENTS OF 1 VIAL VIA NEBULIZER UP TO 4 TO 6 TIMES A DAY., Disp: 360 mL, Rfl: 2 .  Magnesium Oxide 250 MG TABS, Take 250 mg by mouth daily., Disp: , Rfl:  .  mometasone (ASMANEX 60 METERED DOSES) 220 MCG/INH inhaler, Inhale 2 puffs into the lungs 2 (two) times daily., Disp: 1 Inhaler, Rfl: 12 .  montelukast (SINGULAIR) 10 MG tablet, TAKE 1 TABLET BY MOUTH ONCE DAILY, Disp: 90 tablet, Rfl: 5 .  mupirocin ointment (BACTROBAN) 2 %, Place 1 application into the nose 2 (two) times daily., Disp: 22 g, Rfl: 0 .  nitroGLYCERIN (NITROSTAT) 0.4 MG SL tablet, Place 1 tablet (0.4 mg total) under the tongue every 5 (five) minutes as needed for chest pain., Disp: 25 tablet, Rfl: 11 .  tiotropium (SPIRIVA HANDIHALER) 18 MCG inhalation capsule, INHALE THE CONTENTS OF ONE CAPSULE DAILY, Disp: 90 capsule, Rfl: 3 .  venlafaxine XR (EFFEXOR XR) 75 MG 24 hr capsule, Take 1 capsule (75 mg total) by mouth daily., Disp: 90 capsule, Rfl: 1 .  venlafaxine XR (EFFEXOR-XR) 37.5 MG 24 hr capsule, Take 1 capsule (37.5 mg total) by mouth daily., Disp: 90 capsule, Rfl: 1 .  Zinc 30 MG CAPS, Take 30 mg by mouth daily., Disp: , Rfl:  .  amoxicillin-clavulanate (AUGMENTIN) 500-125 MG tablet, Take 1 tablet (500 mg total) by mouth 3 (three) times daily. (Patient not taking: Reported on 05/14/2018), Disp: 21 tablet, Rfl: 0      Objective:   Vitals:   05/14/18 1047    BP: 114/78  Pulse: 80  SpO2: 99%  Weight: 161 lb 12.8 oz (73.4 kg)  Height: 5' 1.5" (1.562 m)    Estimated body mass index is 30.08 kg/m as calculated from the following:  Height as of this encounter: 5' 1.5" (1.562 m).   Weight as of this encounter: 161 lb 12.8 oz (73.4 kg).  @WEIGHTCHANGE @  [employer]   05/14/18 1047  Weight: 161 lb 12.8 oz (73.4 kg)     Physical Exam  General Appearance:    Alert, cooperative, no distress, appears stated age - yes , Deconditioned looking - no , OBESE  - yes, Sitting on Wheelchair -  no  Head:    Normocephalic, without obvious abnormality, atraumatic  Eyes:    PERRL, conjunctiva/corneas clear,  Ears:    Normal TM's and external ear canals, both ears  Nose:   Nares normal, septum midline, mucosa normal, no drainage    or sinus tenderness. OXYGEN ON  - no . Patient is @ ra   Throat:   Lips, mucosa, and tongue normal; teeth and gums normal. Cyanosis on lips - no  Neck:   Supple, symmetrical, trachea midline, no adenopathy;    thyroid:  no enlargement/tenderness/nodules; no carotid   bruit or JVD  Back:     Symmetric, no curvature, ROM normal, no CVA tenderness  Lungs:     Distress - no , Wheeze no, Barrell Chest - no, Purse lip breathing - no, Crackles - no   Chest Wall:    No tenderness or deformity.    Heart:    Regular rate and rhythm, S1 and S2 normal, no rub   or gallop, Murmur - no  Breast Exam:    NOT DONE  Abdomen:     Soft, non-tender, bowel sounds active all four quadrants,    no masses, no organomegaly. Visceral obesity - yes  Genitalia:   NOT DONE  Rectal:   NOT DONE  Extremities:   Extremities - normal, Has Cane - no, Clubbing - no, Edema - no  Pulses:   2+ and symmetric all extremities  Skin:   Stigmata of Connective Tissue Disease - no  Lymph nodes:   Cervical, supraclavicular, and axillary nodes normal  Psychiatric:  Neurologic:   Pleasant - yes, Anxious - no, Flat affect - no  CAm-ICU - neg, Alert and Oriented x 3  - yes, Moves all 4s - yes, Speech - normal, Cognition - intact           Assessment:       ICD-10-CM   1. Severe persistent asthma with intensive monitoring J45.50 Nitric oxide  2. 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[place]) A54   3. A B P A-ALLERGIC BRONCHOPULMONARY ASPERGILLOSIS B44.81   4. MSSA (methicillin susceptible Staphylococcus aureus) infection A49.01   5. Hiatal hernia K44.9        Plan:     Patient Instructions  Severe persistent asthma with intensive monitoring - Plan: Nitric oxide 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[place]) A B P A-ALLERGIC BRONCHOPULMONARY ASPERGILLOSIS  - glad doing well - asthma well controlled with FASENRA - continue same - stop spiriva - stop singulair  - continue asmanex daily as scheduled- go back on this. Not time to stop it (yet)  - take enough for 6 weeks   MSSA (methicillin susceptible Staphylococcus aureus) infection   - per pcp office   Hiatal hernia  - please revisit with Dr Zenovia Jarred  Followup 4 months or sooner if needed     SIGNATURE    Dr. Brand Males, M.D., F.C.C.P,  Pulmonary and Critical Care Medicine Staff Physician, [place] Director - Interstitial Lung Disease  Program  [facility]  [place], [place], [address]  Pager: [phone], If no answer or between  15:00h - 7:00h: call [phone] Telephone: [phone]  11:14 AM 05/14/2018

## 2018-05-14 NOTE — Addendum Note (Signed)
Addended byDamita Dunnings D on: 05/14/2018 07:47 AM   Modules accepted: Orders

## 2018-05-22 ENCOUNTER — Encounter: Payer: Self-pay | Admitting: Family Medicine

## 2018-05-22 ENCOUNTER — Ambulatory Visit (INDEPENDENT_AMBULATORY_CARE_PROVIDER_SITE_OTHER): Payer: PPO | Admitting: Family Medicine

## 2018-05-22 VITALS — BP 116/84 | HR 73 | Temp 98.1°F | Resp 18 | Wt 162.0 lb

## 2018-05-22 DIAGNOSIS — M810 Age-related osteoporosis without current pathological fracture: Secondary | ICD-10-CM

## 2018-05-22 NOTE — Patient Instructions (Signed)
Osteoporosis Osteoporosis is the thinning and loss of density in the bones. Osteoporosis makes the bones more brittle, fragile, and likely to break (fracture). Over time, osteoporosis can cause the bones to become so weak that they fracture after a simple fall. The bones most likely to fracture are the bones in the hip, wrist, and spine. What are the causes? The exact cause is not known. What increases the risk? Anyone can develop osteoporosis. You may be at greater risk if you have a family history of the condition or have poor nutrition. You may also have a higher risk if you are:  Female.  50 years old or older.  A smoker.  Not physically active.  White or Asian.  Slender.  What are the signs or symptoms? A fracture might be the first sign of the disease, especially if it results from a fall or injury that would not usually cause a bone to break. Other signs and symptoms include:  Low back and neck pain.  Stooped posture.  Height loss.  How is this diagnosed? To make a diagnosis, your health care provider may:  Take a medical history.  Perform a physical exam.  Order tests, such as: ? A bone mineral density test. ? A dual-energy X-ray absorptiometry test.  How is this treated? The goal of osteoporosis treatment is to strengthen your bones to reduce your risk of a fracture. Treatment may involve:  Making lifestyle changes, such as: ? Eating a diet rich in calcium. ? Doing weight-bearing and muscle-strengthening exercises. ? Stopping tobacco use. ? Limiting alcohol intake.  Taking medicine to slow the process of bone loss or to increase bone density.  Monitoring your levels of calcium and vitamin D.  Follow these instructions at home:  Include calcium and vitamin D in your diet. Calcium is important for bone health, and vitamin D helps the body absorb calcium.  Perform weight-bearing and muscle-strengthening exercises as directed by your health care  provider.  Do not use any tobacco products, including cigarettes, chewing tobacco, and electronic cigarettes. If you need help quitting, ask your health care provider.  Limit your alcohol intake.  Take medicines only as directed by your health care provider.  Keep all follow-up visits as directed by your health care provider. This is important.  Take precautions at home to lower your risk of falling, such as: ? Keeping rooms well lit and clutter free. ? Installing safety rails on stairs. ? Using rubber mats in the bathroom and other areas that are often wet or slippery. Get help right away if: You fall or injure yourself. This information is not intended to replace advice given to you by your health care provider. Make sure you discuss any questions you have with your health care provider. Document Released: 03/16/2005 Document Revised: 11/09/2015 Document Reviewed: 11/14/2013 Elsevier Interactive Patient Education  2018 Elsevier Inc.  

## 2018-05-23 DIAGNOSIS — M810 Age-related osteoporosis without current pathological fracture: Secondary | ICD-10-CM | POA: Insufficient documentation

## 2018-05-23 LAB — COMPREHENSIVE METABOLIC PANEL
ALBUMIN: 4.4 g/dL (ref 3.5–5.2)
ALT: 19 U/L (ref 0–35)
AST: 18 U/L (ref 0–37)
Alkaline Phosphatase: 93 U/L (ref 39–117)
BILIRUBIN TOTAL: 0.3 mg/dL (ref 0.2–1.2)
BUN: 14 mg/dL (ref 6–23)
CO2: 28 mEq/L (ref 19–32)
CREATININE: 0.62 mg/dL (ref 0.40–1.20)
Calcium: 9.6 mg/dL (ref 8.4–10.5)
Chloride: 100 mEq/L (ref 96–112)
GFR: 103.99 mL/min (ref >60.00)
Glucose, Bld: 93 mg/dL (ref 70–99)
Potassium: 3.8 mEq/L (ref 3.5–5.1)
SODIUM: 136 meq/L (ref 135–145)
TOTAL PROTEIN: 6.7 g/dL (ref 6.0–8.3)

## 2018-05-23 LAB — VITAMIN D 25 HYDROXY (VIT D DEFICIENCY, FRACTURES): VITD: 57.7 ng/mL (ref 30.00–100.00)

## 2018-05-23 NOTE — Assessment & Plan Note (Addendum)
Brings in scans done by her orthopaedist at [facility] which confirm osteoporosis. She had been on Fosamax for more than 5 years many years ago before she had osteoporosis. They were going to refer her to endocrinology but we will check labs here and try a course of Prolia once PA is finished. Encouraged to get adequate exercise, calcium and vitamin d intake. Spent 25 minutes reviewing records, evaluating patient and developing treatment plan

## 2018-05-23 NOTE — Progress Notes (Signed)
Subjective:    Patient ID: Victoria [name], female    DOB: [birth year], 62 y.o.   MRN: [id number]  No chief complaint on file.   HPI Patient is in today for evaluation of osteoporosis.  She has been undergoing her bone density scans at [facility] with her orthopedist and with [facility].  She is currently not taking any medications but did take Fosamax for many years in the past when her bone density was not so severe.  This was stopped.  She does have significant hiatal hernia and reflux so does not tolerate Fosamax.  No recent febrile illness or hospitalizations.  She had presented to her orthopedist with increasing back pain and that is persistent unfortunately.  Continues to struggle with bilateral hip pain as well. Denies CP/palp/SOB/HA/congestion/fevers/GI or GU c/o. Taking meds as prescribed  Past Medical History:  Diagnosis Date  . Allergic bronchopulmonary aspergillosis ([place]) 2008   sees Dr Edmund Hilda pulmonology  . Anemia    iron deficiency, resolved  . Anxiety   . Asthma   . CAD (coronary artery disease)    a. LHC 6/16:  oOM1 60, pRCA 25 >> med Rx b. cath 3/19 2nd OM with 95% stenosis s/p synergy DES & anomalous RCA  . CAP (community acquired pneumonia) 2016; 06/07/2016  . Chronic bronchitis ([place])   . Chronic lower back pain   . Complication of anesthesia    "think I have a hard time waking up from it"  . COPD (chronic obstructive pulmonary disease) ([place])   . Depression    mild  . Diverticulosis   . GERD (gastroesophageal reflux disease)   . H/O hiatal hernia   . Headache    "weekly" (08/23/2017)  . History of echocardiogram    Echo 6/16:  Mod LVH, EF 60-65%, no RWMA, Gr 1 DD, trivial MR, normal LA size.  Marland Kitchen Hyperglycemia 11/20/2015  . Hyperlipidemia, mixed 09/11/2007   Qualifier: Diagnosis of  By: Jerold Coombe   Did not tolerate Lipitor, zocor, Lovastatin, Pravastatin, Livalo, Crestor even low dose   . IBS (irritable bowel syndrome)   . Maxillary  sinusitis   . Normal cardiac stress test 11/2011   No evidence of ischemia or infarct. Calculated ejection fraction 72%.  . Obesity   . OSA (obstructive sleep apnea) 02/2012   has stopped using  cpap  . Osteoarthritis   . Osteoporosis   . Pneumonia 11/2011   "before 2013 I hadn't had pneumonia since I was a child" (04/13/2012)  . Pulmonary nodules   . S/P angioplasty with stent 08/23/17 ostial 2nd OM with DES synnergy 08/24/2017  . Schatzki's ring     Past Surgical History:  Procedure Laterality Date  . APPENDECTOMY  1989  . CARDIAC CATHETERIZATION N/A 11/25/2014   Procedure: Right/Left Heart Cath and Coronary Angiography;  Surgeon: Belva Crome, MD;  Location: [place] [facility];  Service: Cardiovascular;  Laterality: N/A;  . CESAREAN SECTION  1985  . CORONARY ANGIOPLASTY WITH STENT PLACEMENT  08/23/2017  . CORONARY STENT INTERVENTION N/A 08/23/2017   Procedure: CORONARY STENT INTERVENTION;  Surgeon: Burnell Blanks, MD;  Location: [place] [facility];  Service: Cardiovascular;  Laterality: N/A;  . HERNIA REPAIR  04/13/2012   VHR laparoscopic  . LEFT HEART CATH AND CORONARY ANGIOGRAPHY N/A 08/23/2017   Procedure: LEFT HEART CATH AND CORONARY ANGIOGRAPHY;  Surgeon: Burnell Blanks, MD;  Location: [place] [facility];  Service: Cardiovascular;  Laterality: N/A;  . VENTRAL HERNIA  REPAIR  04/13/2012   Procedure: LAPAROSCOPIC VENTRAL HERNIA;  Surgeon: Adin Hector, MD;  Location: [place];  Service: General;  Laterality: N/A;  laparoscopic repair of incarcerated hernia    Family History  Problem Relation Age of Onset  . Breast cancer Mother   . Hypertension Mother   . Diabetes Mother   . Diverticulosis Father   . Prostate cancer Father   . Pulmonary embolism Brother        recurrent  . Heart attack Maternal Grandfather   . Breast cancer Sister   . Stroke Neg Hx     Social History   Socioeconomic History  . Marital status: Married    Spouse name: Not on file  .  Number of children: 1  . Years of education: Not on file  . Highest education level: Not on file  Occupational History    Employer: UNEMPLOYED  Social Needs  . Financial resource strain: Not on file  . Food insecurity:    Worry: Not on file    Inability: Not on file  . Transportation needs:    Medical: Not on file    Non-medical: Not on file  Tobacco Use  . Smoking status: Never Smoker  . Smokeless tobacco: Never Used  Substance and Sexual Activity  . Alcohol use: Yes    Alcohol/week: 4.0 standard drinks    Types: 2 Glasses of wine, 2 Cans of beer per week  . Drug use: No  . Sexual activity: Yes    Comment: gluten free, lives with husband and son with CP quadriplegia  Lifestyle  . Physical activity:    Days per week: Not on file    Minutes per session: Not on file  . Stress: Not on file  Relationships  . Social connections:    Talks on phone: Not on file    Gets together: Not on file    Attends religious service: Not on file    Active member of club or organization: Not on file    Attends meetings of clubs or organizations: Not on file    Relationship status: Not on file  . Intimate partner violence:    Fear of current or ex partner: Not on file    Emotionally abused: Not on file    Physically abused: Not on file    Forced sexual activity: Not on file  Other Topics Concern  . Not on file  Social History Narrative   Cares for a 43yo son with cerebral palsy.     Outpatient Medications Prior to Visit  Medication Sig Dispense Refill  . aspirin 81 MG tablet Take 81 mg by mouth daily.     Marland Kitchen Benralizumab (FASENRA) 30 MG/ML SOSY Inject 1 Syringe into the skin.    . Calcium-Magnesium-Vitamin D 300-150-400 MG-MG-UNIT TABS Take 1 tablet by mouth daily.    . Cholecalciferol (VITAMIN D) 2000 UNITS CAPS Take 2,000 Units by mouth daily.     . clopidogrel (PLAVIX) 75 MG tablet TAKE 1 TABLET BY MOUTH DAILY WITH BREAKFAST. 30 tablet 6  . Evolocumab (REPATHA SURECLICK) 735 MG/ML  SOAJ Inject 1 pen into the skin every 14 (fourteen) days. 2 pen 11  . FASENRA 30 MG/ML SOSY Inject 1 Syringe into the muscle every 30 (thirty) days.    Marland Kitchen ipratropium-albuterol (DUONEB) 0.5-2.5 (3) MG/3ML SOLN INHALE THE CONTENTS OF 1 VIAL VIA NEBULIZER UP TO 4 TO 6 TIMES A DAY. 60 mL 2  . Magnesium Oxide 250 MG TABS Take 250  mg by mouth daily.    . mometasone (ASMANEX 60 METERED DOSES) 220 MCG/INH inhaler Inhale 2 puffs into the lungs 2 (two) times daily. 1 Inhaler 12  . mometasone (ASMANEX, 60 METERED DOSES,) 220 MCG/INH inhaler Inhale 2 puffs into the lungs 2 (two) times daily. 1 Inhaler 12  . mupirocin ointment (BACTROBAN) 2 % Place 1 application into the nose 2 (two) times daily. 22 g 0  . nitroGLYCERIN (NITROSTAT) 0.4 MG SL tablet Place 1 tablet (0.4 mg total) under the tongue every 5 (five) minutes as needed for chest pain. 25 tablet 11  . venlafaxine XR (EFFEXOR XR) 75 MG 24 hr capsule Take 1 capsule (75 mg total) by mouth daily. 90 capsule 1  . venlafaxine XR (EFFEXOR-XR) 37.5 MG 24 hr capsule Take 1 capsule (37.5 mg total) by mouth daily. 90 capsule 1  . Zinc 30 MG CAPS Take 30 mg by mouth daily.    Marland Kitchen amoxicillin-clavulanate (AUGMENTIN) 500-125 MG tablet Take 1 tablet (500 mg total) by mouth 3 (three) times daily. (Patient not taking: Reported on 05/14/2018) 21 tablet 0  . montelukast (SINGULAIR) 10 MG tablet TAKE 1 TABLET BY MOUTH ONCE DAILY (Patient not taking: Reported on 05/14/2018) 90 tablet 5  . tiotropium (SPIRIVA HANDIHALER) 18 MCG inhalation capsule INHALE THE CONTENTS OF ONE CAPSULE DAILY (Patient not taking: Reported on 05/14/2018) 90 capsule 3   No facility-administered medications prior to visit.     Allergies  Allergen Reactions  . Beclomethasone Dipropionate Hives and Other (See Comments)     weight gain  . Budesonide-Formoterol Fumarate Hives  . Mometasone Furo-Formoterol Fum Hives and Other (See Comments)    weight gain  . Sulfonamide Derivatives Hives and Rash  .  Pulmicort [Budesonide]     Headaches   . Statins     Myalgias, RLS    Review of Systems  Constitutional: Positive for malaise/fatigue. Negative for fever.  HENT: Negative for congestion.   Eyes: Negative for blurred vision.  Respiratory: Negative for shortness of breath.   Cardiovascular: Negative for chest pain, palpitations and leg swelling.  Gastrointestinal: Negative for abdominal pain, blood in stool and nausea.  Genitourinary: Negative for dysuria and frequency.  Musculoskeletal: Positive for back pain. Negative for falls.  Skin: Negative for rash.  Neurological: Negative for dizziness, loss of consciousness and headaches.  Endo/Heme/Allergies: Negative for environmental allergies.  Psychiatric/Behavioral: Negative for depression. The patient is not nervous/anxious.        Objective:    Physical Exam  Constitutional: She is oriented to person, place, and time. She appears well-developed and well-nourished. No distress.  HENT:  Head: Normocephalic and atraumatic.  Nose: Nose normal.  Eyes: Right eye exhibits no discharge. Left eye exhibits no discharge.  Neck: Normal range of motion. Neck supple.  Cardiovascular: Normal rate and regular rhythm.  No murmur heard. Pulmonary/Chest: Effort normal and breath sounds normal.  Abdominal: Soft. Bowel sounds are normal. There is no tenderness.  Musculoskeletal: She exhibits no edema.  Neurological: She is alert and oriented to person, place, and time.  Skin: Skin is warm and dry.  Psychiatric: She has a normal mood and affect.  Nursing note and vitals reviewed.   BP 116/84 (BP Location: Left Arm, Patient Position: Sitting, Cuff Size: Normal)   Pulse 73   Temp 98.1 F (36.7 C) (Oral)   Resp 18   Wt 162 lb (73.5 kg)   SpO2 98%   BMI 30.11 kg/m  Wt Readings from Last 3 Encounters:  05/22/18 162 lb (73.5 kg)  05/14/18 161 lb 12.8 oz (73.4 kg)  05/08/18 161 lb 6 oz (73.2 kg)     Lab Results  Component Value Date    WBC 6.8 04/02/2018   HGB 13.0 04/02/2018   HCT 39.0 04/02/2018   PLT 298.0 04/02/2018   GLUCOSE 93 04/02/2018   CHOL 150 04/02/2018   TRIG 132.0 04/02/2018   HDL 54.50 04/02/2018   LDLDIRECT 177.8 09/07/2011   LDLCALC 69 04/02/2018   ALT 19 04/02/2018   AST 15 04/02/2018   NA 139 04/02/2018   K 4.1 04/02/2018   CL 103 04/02/2018   CREATININE 0.62 04/02/2018   BUN 16 04/02/2018   CO2 31 04/02/2018   TSH 1.51 04/02/2018   INR 1.0 08/21/2017   HGBA1C 5.5 04/02/2018    Lab Results  Component Value Date   TSH 1.51 04/02/2018   Lab Results  Component Value Date   WBC 6.8 04/02/2018   HGB 13.0 04/02/2018   HCT 39.0 04/02/2018   MCV 91.0 04/02/2018   PLT 298.0 04/02/2018   Lab Results  Component Value Date   NA 139 04/02/2018   K 4.1 04/02/2018   CO2 31 04/02/2018   GLUCOSE 93 04/02/2018   BUN 16 04/02/2018   CREATININE 0.62 04/02/2018   BILITOT 0.3 04/02/2018   ALKPHOS 88 04/02/2018   AST 15 04/02/2018   ALT 19 04/02/2018   PROT 6.4 04/02/2018   ALBUMIN 4.2 04/02/2018   CALCIUM 9.5 04/02/2018   ANIONGAP 6 08/24/2017   GFR 104.04 04/02/2018   Lab Results  Component Value Date   CHOL 150 04/02/2018   Lab Results  Component Value Date   HDL 54.50 04/02/2018   Lab Results  Component Value Date   LDLCALC 69 04/02/2018   Lab Results  Component Value Date   TRIG 132.0 04/02/2018   Lab Results  Component Value Date   CHOLHDL 3 04/02/2018   Lab Results  Component Value Date   HGBA1C 5.5 04/02/2018       Assessment & Plan:   Problem List Items Addressed This Visit    Osteoporosis - Primary    Brings in scans done by her orthopaedist at [facility] which confirm osteoporosis. She had been on Fosamax for more than 5 years many years ago before she had osteoporosis. They were going to refer her to endocrinology but we will check labs here and try a course of Prolia once PA is finished. Encouraged to get adequate exercise, calcium and vitamin d intake. Spent 25  minutes reviewing records, evaluating patient and developing treatment plan      Relevant Orders   Comprehensive metabolic panel   VITAMIN D 25 Hydroxy (Vit-D Deficiency, Fractures)      I have discontinued Tenna Child. [place]'s tiotropium, montelukast, and amoxicillin-clavulanate. I am also having her maintain her Vitamin D, aspirin, Evolocumab, nitroGLYCERIN, FASENRA, Magnesium Oxide, Calcium-Magnesium-Vitamin D, mometasone, clopidogrel, venlafaxine XR, venlafaxine XR, mupirocin ointment, Zinc, Benralizumab, mometasone, and ipratropium-albuterol.  No orders of the defined types were placed in this encounter.    [name], MD

## 2018-05-24 ENCOUNTER — Ambulatory Visit: Payer: PPO | Admitting: Cardiology

## 2018-05-24 ENCOUNTER — Encounter: Payer: Self-pay | Admitting: Cardiology

## 2018-05-24 VITALS — BP 114/70 | HR 79 | Ht 62.0 in | Wt 162.8 lb

## 2018-05-24 DIAGNOSIS — E782 Mixed hyperlipidemia: Secondary | ICD-10-CM

## 2018-05-24 DIAGNOSIS — I2510 Atherosclerotic heart disease of native coronary artery without angina pectoris: Secondary | ICD-10-CM

## 2018-05-24 DIAGNOSIS — R0789 Other chest pain: Secondary | ICD-10-CM

## 2018-05-24 NOTE — Patient Instructions (Signed)
Medication Instructions:   Your physician recommends that you continue on your current medications as directed. Please refer to the Current Medication list given to you today.  If you need a refill on your cardiac medications before your next appointment, please call your pharmacy.      Follow-Up:  3 MONTHS WITH DR NELSON   

## 2018-05-24 NOTE — Progress Notes (Signed)
05/24/2018 Victoria [name]   06-20-1956  [id number]  Primary Physician Mosie Lukes, MD Primary Cardiologist: Dr. Meda Coffee   Reason for Visit/CC: Add on for CP  HPI:  Victoria [name] is a 62 y.o. female who is being seen today for CP. She is followed by Dr. Meda Coffee has has a h/o CAD. She underwent cardiac catheterization for CP in March of this year which showed single vessel obstructive CAD with severe stenosis in the ostium of the large obtuse marginal branch. This was treated with successful PTCA/DES x1. LVEF was normal. She also has a h/o GERD and hiatal hernia. She was previously on omeprazole, which was controlling her reflux, but this was discontinued in March after she got her stent and was placed on DAPT w/ ASA and Plavix. She was switched to Protonix but did not tolerate this due to side effects. She has since been using a OTC H2 blocker, Zantac, even despite the recall. She also has HLD which is controlled with Repatha. Recent lipid panel showed LDL at 69 mg/dL.  She presents to clinic with new complaints of chest pain which started last week.  The chest pain is likely different from the angina she experienced prior to undergoing stent placement in March.  Her pain at that time was more of a chest tightness/squeezing sensation.  Her new chest pain has felt more like burning /indigestion but no relationship with meals. Does not radiate. It has occurred several times off and on and last a couple minutes at a time.  Some occasional relief with belching. No associated dyspnea. Does not seem to be worsened by exertion.  She reports full medication compliance with aspirin and Plavix.  She tried taking sublingual nitroglycerin yesterday without relief.  It did cause a headache.   05/24/2018 -disease 1 month follow-up, she was here at the end of October for complaints of chest pain, since then she switched her PPI medicines and completed antibiotics for lower extremity cellulitis and her pain has  completely resolved.  She walks daily and has no symptoms of chest pain or shortness of breath.  She has been compliant with her meds.  Current Meds  Medication Sig  . aspirin 81 MG tablet Take 81 mg by mouth daily.   . Calcium-Magnesium-Vitamin D 300-150-400 MG-MG-UNIT TABS Take 1 tablet by mouth daily.  . Cholecalciferol (VITAMIN D) 2000 UNITS CAPS Take 2,000 Units by mouth daily.   . clopidogrel (PLAVIX) 75 MG tablet TAKE 1 TABLET BY MOUTH DAILY WITH BREAKFAST.  Marland Kitchen Evolocumab (REPATHA SURECLICK) 597 MG/ML SOAJ Inject 1 pen into the skin every 14 (fourteen) days.  Marland Kitchen FASENRA 30 MG/ML SOSY Inject 1 Syringe into the muscle every 30 (thirty) days.  Marland Kitchen ipratropium-albuterol (DUONEB) 0.5-2.5 (3) MG/3ML SOLN INHALE THE CONTENTS OF 1 VIAL VIA NEBULIZER UP TO 4 TO 6 TIMES A DAY.  . Magnesium Oxide 250 MG TABS Take 250 mg by mouth daily.  . mometasone (ASMANEX, 60 METERED DOSES,) 220 MCG/INH inhaler Inhale 2 puffs into the lungs 2 (two) times daily.  . mupirocin ointment (BACTROBAN) 2 % Place 1 application into the nose 2 (two) times daily.  . nitroGLYCERIN (NITROSTAT) 0.4 MG SL tablet Place 1 tablet (0.4 mg total) under the tongue every 5 (five) minutes as needed for chest pain.  Marland Kitchen venlafaxine XR (EFFEXOR XR) 75 MG 24 hr capsule Take 1 capsule (75 mg total) by mouth daily.  Marland Kitchen venlafaxine XR (EFFEXOR-XR) 37.5 MG 24 hr capsule Take 1 capsule (37.5  mg total) by mouth daily.  . Zinc 30 MG CAPS Take 30 mg by mouth daily.   Allergies  Allergen Reactions  . Beclomethasone Dipropionate Hives and Other (See Comments)     weight gain  . Budesonide-Formoterol Fumarate Hives  . Mometasone Furo-Formoterol Fum Hives and Other (See Comments)    weight gain  . Sulfonamide Derivatives Hives and Rash  . Pulmicort [Budesonide]     Headaches   . Statins     Myalgias, RLS   Past Medical History:  Diagnosis Date  . Allergic bronchopulmonary aspergillosis ([place]) 2008   sees Dr Edmund Hilda pulmonology  .  Anemia    iron deficiency, resolved  . Anxiety   . Asthma   . CAD (coronary artery disease)    a. LHC 6/16:  oOM1 60, pRCA 25 >> med Rx b. cath 3/19 2nd OM with 95% stenosis s/p synergy DES & anomalous RCA  . CAP (community acquired pneumonia) 2016; 06/07/2016  . Chronic bronchitis ([place])   . Chronic lower back pain   . Complication of anesthesia    "think I have a hard time waking up from it"  . COPD (chronic obstructive pulmonary disease) ([place])   . Depression    mild  . Diverticulosis   . GERD (gastroesophageal reflux disease)   . H/O hiatal hernia   . Headache    "weekly" (08/23/2017)  . History of echocardiogram    Echo 6/16:  Mod LVH, EF 60-65%, no RWMA, Gr 1 DD, trivial MR, normal LA size.  Marland Kitchen Hyperglycemia 11/20/2015  . Hyperlipidemia, mixed 09/11/2007   Qualifier: Diagnosis of  By: Jerold Coombe   Did not tolerate Lipitor, zocor, Lovastatin, Pravastatin, Livalo, Crestor even low dose   . IBS (irritable bowel syndrome)   . Maxillary sinusitis   . Normal cardiac stress test 11/2011   No evidence of ischemia or infarct. Calculated ejection fraction 72%.  . Obesity   . OSA (obstructive sleep apnea) 02/2012   has stopped using  cpap  . Osteoarthritis   . Osteoporosis   . Pneumonia 11/2011   "before 2013 I hadn't had pneumonia since I was a child" (04/13/2012)  . Pulmonary nodules   . S/P angioplasty with stent 08/23/17 ostial 2nd OM with DES synnergy 08/24/2017  . Schatzki's ring    Family History  Problem Relation Age of Onset  . Breast cancer Mother   . Hypertension Mother   . Diabetes Mother   . Diverticulosis Father   . Prostate cancer Father   . Pulmonary embolism Brother        recurrent  . Heart attack Maternal Grandfather   . Breast cancer Sister   . Stroke Neg Hx    Past Surgical History:  Procedure Laterality Date  . APPENDECTOMY  1989  . CARDIAC CATHETERIZATION N/A 11/25/2014   Procedure: Right/Left Heart Cath and Coronary Angiography;  Surgeon: Belva Crome, MD;  Location: [place] [facility];  Service: Cardiovascular;  Laterality: N/A;  . CESAREAN SECTION  1985  . CORONARY ANGIOPLASTY WITH STENT PLACEMENT  08/23/2017  . CORONARY STENT INTERVENTION N/A 08/23/2017   Procedure: CORONARY STENT INTERVENTION;  Surgeon: Burnell Blanks, MD;  Location: [place] [facility];  Service: Cardiovascular;  Laterality: N/A;  . HERNIA REPAIR  04/13/2012   VHR laparoscopic  . LEFT HEART CATH AND CORONARY ANGIOGRAPHY N/A 08/23/2017   Procedure: LEFT HEART CATH AND CORONARY ANGIOGRAPHY;  Surgeon: Burnell Blanks, MD;  Location: [place] [facility];  Service: Cardiovascular;  Laterality: N/A;  . VENTRAL HERNIA REPAIR  04/13/2012   Procedure: LAPAROSCOPIC VENTRAL HERNIA;  Surgeon: Adin Hector, MD;  Location: [place];  Service: General;  Laterality: N/A;  laparoscopic repair of incarcerated hernia   Social History   Socioeconomic History  . Marital status: Married    Spouse name: Not on file  . Number of children: 1  . Years of education: Not on file  . Highest education level: Not on file  Occupational History    Employer: UNEMPLOYED  Social Needs  . Financial resource strain: Not on file  . Food insecurity:    Worry: Not on file    Inability: Not on file  . Transportation needs:    Medical: Not on file    Non-medical: Not on file  Tobacco Use  . Smoking status: Never Smoker  . Smokeless tobacco: Never Used  Substance and Sexual Activity  . Alcohol use: Yes    Alcohol/week: 4.0 standard drinks    Types: 2 Glasses of wine, 2 Cans of beer per week  . Drug use: No  . Sexual activity: Yes    Comment: gluten free, lives with husband and son with CP quadriplegia  Lifestyle  . Physical activity:    Days per week: Not on file    Minutes per session: Not on file  . Stress: Not on file  Relationships  . Social connections:    Talks on phone: Not on file    Gets together: Not on file    Attends religious service: Not on file     Active member of club or organization: Not on file    Attends meetings of clubs or organizations: Not on file    Relationship status: Not on file  . Intimate partner violence:    Fear of current or ex partner: Not on file    Emotionally abused: Not on file    Physically abused: Not on file    Forced sexual activity: Not on file  Other Topics Concern  . Not on file  Social History Narrative   Cares for a 59yo son with cerebral palsy.      Review of Systems: General: negative for chills, fever, night sweats or weight changes.  Cardiovascular: negative for chest pain, dyspnea on exertion, edema, orthopnea, palpitations, paroxysmal nocturnal dyspnea or shortness of breath Dermatological: negative for rash Respiratory: negative for cough or wheezing Urologic: negative for hematuria Abdominal: negative for nausea, vomiting, diarrhea, bright red blood per rectum, melena, or hematemesis Neurologic: negative for visual changes, syncope, or dizziness All other systems reviewed and are otherwise negative except as noted above.   Physical Exam:  Blood pressure 114/70, pulse 79, height 5\' 2"  (1.575 m), weight 162 lb 12.8 oz (73.8 kg), SpO2 99 %.  General appearance: alert, cooperative and no distress Neck: no carotid bruit and no JVD Lungs: clear to auscultation bilaterally Heart: regular rate and rhythm, S1, S2 normal, no murmur, click, rub or gallop Extremities: extremities normal, atraumatic, no cyanosis or edema Pulses: 2+ and symmetric Skin: Skin color, texture, turgor normal. No rashes or lesions Neurologic: Grossly normal  EKG NSR 69 bpm, no ischemic abnormalities -- personally reviewed   ASSESSMENT AND PLAN:   1. CAD/ Chest Pain: status post cardiac catheterization March 2019 showing severe single-vessel CAD with severe stenosis in the ostium of the large obtuse marginal branch.  This was successfully treated with PTCA/DES x1 to the ostium of the OM.   Chest pain most  probably  related to gastric reflux.  Now resolved.  Her EKG was completely normal, she did undergo nuclear stress testing however I do not think she needs it anymore.  Continue aspirin and Plavix, she is okay to take turmeric supplements, she would like to discontinue Plavix in March 2020 when he is 1 year from her stent placement.  2.  Hyperlipidemia -on Repatha and all lipids are at goal.  Ena Dawley , MD [facility] 05/24/2018 8:31 AM

## 2018-06-07 DIAGNOSIS — M545 Low back pain: Secondary | ICD-10-CM | POA: Diagnosis not present

## 2018-06-08 ENCOUNTER — Encounter: Payer: Self-pay | Admitting: Family Medicine

## 2018-06-15 ENCOUNTER — Telehealth: Payer: Self-pay | Admitting: Internal Medicine

## 2018-06-15 NOTE — Telephone Encounter (Signed)
Arrival Date:06/15/18 Lot #: [id number] Exp date: 01/2020

## 2018-06-15 NOTE — Telephone Encounter (Signed)
1 prefilled syringe Ordered date: 06/14/18 Shipping date: 06/14/18 Pt must have ordered med. I don't remember getting a call from the pharm.Marland Kitchen

## 2018-06-26 ENCOUNTER — Ambulatory Visit: Payer: PPO

## 2018-06-26 ENCOUNTER — Ambulatory Visit (INDEPENDENT_AMBULATORY_CARE_PROVIDER_SITE_OTHER): Payer: PPO

## 2018-06-26 DIAGNOSIS — J4550 Severe persistent asthma, uncomplicated: Secondary | ICD-10-CM | POA: Diagnosis not present

## 2018-06-26 MED ORDER — BENRALIZUMAB 30 MG/ML ~~LOC~~ SOSY
30.0000 mg | PREFILLED_SYRINGE | Freq: Once | SUBCUTANEOUS | Status: AC
  Administered 2018-06-26: 30 mg via SUBCUTANEOUS

## 2018-06-26 NOTE — Progress Notes (Signed)
Documentation of medication administration and charges of [name] have been completed by Desmond Dike, CMA based on the hand written [facility] documentation sheet completed by Alroy Bailiff, who administered the medication.

## 2018-06-29 ENCOUNTER — Telehealth: Payer: Self-pay

## 2018-06-29 NOTE — Telephone Encounter (Signed)
Copied from [place] [phone]. Topic: General - Other >> Jun 29, 2018 10:10 AM Yvette Rack wrote: Reason for CRM: Pt stated Dr. Charlett Blake had been working on an approval for Prolia but she has not heard back from anyone. Pt would like an update regarding the approval for Prolia. Pt requests call back. Cb# [phone]    Can you start the prolia PA for her  Please advise

## 2018-07-02 NOTE — Telephone Encounter (Signed)
Please advise on Prolia

## 2018-07-04 NOTE — Telephone Encounter (Signed)
Called patient to advise that there has been a change in who is handling the Prolia in the clinic.  Further explained that I would be in touch after our Prolia meeting on 07/10/18 at 1pm to give her more information as to what to expect.  Pt acknowledged understanding.

## 2018-07-05 NOTE — Telephone Encounter (Signed)
Note placed in prolia book to as at meeting.

## 2018-07-06 ENCOUNTER — Telehealth: Payer: Self-pay | Admitting: Pharmacist

## 2018-07-06 NOTE — Telephone Encounter (Signed)
Called patient to let her know that her PA for Repatha was extended through 08/2018 and that we faxed over her paper work for [facility]

## 2018-07-09 ENCOUNTER — Encounter: Payer: Self-pay | Admitting: Family Medicine

## 2018-07-10 ENCOUNTER — Other Ambulatory Visit: Payer: Self-pay | Admitting: Pharmacist

## 2018-07-12 MED ORDER — EVOLOCUMAB 140 MG/ML ~~LOC~~ SOAJ: 1.0000 "pen " | SUBCUTANEOUS | 3 refills | Status: DC

## 2018-07-12 NOTE — Telephone Encounter (Signed)
Spoke with pt regarding Repatha - Safety Net Application has been denied. With [employer] insurance, 3 month supply of Repatha is $180. This is affordable for pt although she is worried about going into the donut hole later this year. She will call with any problems.

## 2018-07-12 NOTE — Addendum Note (Signed)
Addended by: SUPPLE, MEGAN E on: 07/12/2018 09:38 AM   Modules accepted: Orders

## 2018-07-17 ENCOUNTER — Telehealth: Payer: Self-pay | Admitting: Internal Medicine

## 2018-07-17 NOTE — Telephone Encounter (Signed)
Spoke with pt. States that she is having increased coughing at night time ONLY. Cough is non productive. Denies chest tightness, wheezing, shortness of breath or fever. Pt has been taking Mucinex DM with no relief. Pt is requesting a prescription for Tussionex.  MR - please advise. Thanks.

## 2018-07-17 NOTE — Telephone Encounter (Signed)
Called and spoke with patient regarding MR recommendations below Pt did not want the prednisone as she thinks she has a virus not asthma related issues. Advised pt of his concerns and recommendations again She asked to be seen in office for her current symptoms Scheduled acute ov with TN 07/18/2018 at 11am Pt states she has dry cough, chest tightness and sore throat. Pt verbalized understanding, no further concerns Nothing further needed at time.

## 2018-07-17 NOTE — Telephone Encounter (Signed)
Concerned that the night cough is from asthma and she needs a prednisone burst  as opposed to tussionex which is just for symptom relief  If she is ok with prednisone - do Take prednisone 40 mg daily x 2 days, then 20mg  daily x 2 days, then 10mg  daily x 2 days, then 5mg  daily x 2 days and stop  If she insists on tussionex - can do 45mL QHS prn x 10 days. No refills   I prefer the pred approach to start with   SIGNATURE    Dr. Brand Males, M.D., F.C.C.P,  Pulmonary and Critical Care Medicine Staff Physician, [place] Director - Interstitial Lung Disease  Program  [facility] [place], [place], [address]  Pager: [phone], If no answer or between  15:00h - 7:00h: call [phone] Telephone: [phone]  5:06 PM 07/17/2018

## 2018-07-18 ENCOUNTER — Ambulatory Visit (INDEPENDENT_AMBULATORY_CARE_PROVIDER_SITE_OTHER): Payer: PPO | Admitting: Nurse Practitioner

## 2018-07-18 ENCOUNTER — Encounter: Payer: Self-pay | Admitting: Nurse Practitioner

## 2018-07-18 VITALS — BP 112/70 | HR 92 | Ht 61.0 in | Wt 163.8 lb

## 2018-07-18 DIAGNOSIS — J209 Acute bronchitis, unspecified: Secondary | ICD-10-CM | POA: Diagnosis not present

## 2018-07-18 DIAGNOSIS — J45909 Unspecified asthma, uncomplicated: Secondary | ICD-10-CM

## 2018-07-18 MED ORDER — HYDROCOD POLST-CPM POLST ER 10-8 MG/5ML PO SUER: 5.0000 mL | Freq: Two times a day (BID) | ORAL | 0 refills | Status: AC | PRN

## 2018-07-18 NOTE — Assessment & Plan Note (Signed)
Patient would like to defer antibiotics and prednisone at this time. Requesting Tussionex. Discussed that I could give her a small bottle of Tussionex with no refills and to use it sparingly. Her last prescription of Tussionex was in 2017. If symptoms worsen please call - may need prednisone and/or antibiotic.   Patient Instructions  Will order tussionex twice daily as needed for cough - please use sparingly Will defer antibiotics today - please call if symptoms worsen  General Instructions:  Hydrate  Drink enough water to keep your pee (urine) clear or pale yellow. Rest  Rest as much as possible.  Sleep with your head raised (elevated).  Make sure to get enough sleep each night. Other instructions  Put a warm, moist washcloth on your face 3-4 times a day or as told by your doctor. This will help with discomfort.  Wash your hands often with soap and water. If there is no soap and water, use hand sanitizer.  Do not smoke. Avoid being around people who are smoking (secondhand smoke). Call the office if:  You have a fever.  Your symptoms get worse.  Your symptoms do not get better within 10 days.  Follow up as scheduled with Dr. Chase Caller or sooner if needed

## 2018-07-18 NOTE — Patient Instructions (Signed)
Will order tussionex twice daily as needed for cough - please use sparingly Will defer antibiotics today - please call if symptoms worsen  General Instructions:  Hydrate  Drink enough water to keep your pee (urine) clear or pale yellow. Rest  Rest as much as possible.  Sleep with your head raised (elevated).  Make sure to get enough sleep each night. Other instructions  Put a warm, moist washcloth on your face 3-4 times a day or as told by your doctor. This will help with discomfort.  Wash your hands often with soap and water. If there is no soap and water, use hand sanitizer.  Do not smoke. Avoid being around people who are smoking (secondhand smoke). Call the office if:  You have a fever.  Your symptoms get worse.  Your symptoms do not get better within 10 days.  Follow up as scheduled with Dr. Chase Caller or sooner if needed

## 2018-07-18 NOTE — Progress Notes (Signed)
@Patient  ID: Victoria [name], female    DOB: 22-Apr-1956, 63 y.o.   MRN: [id number]  Chief Complaint  Patient presents with  . Acute Visit    Referring provider: Mosie Lukes, MD  HPI 63 year old female with asthma, OSA, allergic bronchopulmonary aspergillosis who is followed by Dr. Chase Caller.  Tests: CXR 07/14/16>>Similar appearance of left basilar opacity, compatible with persisting pneumonia given the history.  PFT: PFT Results Latest Ref Rng & Units 10/19/2017  FVC-Pre L 2.65  FVC-Predicted Pre % 86  FVC-Post L 2.57  FVC-Predicted Post % 84  Pre FEV1/FVC % % 67  Post FEV1/FCV % % 70  FEV1-Pre L 1.79  FEV1-Predicted Pre % 75  FEV1-Post L 1.80  DLCO UNC% % 90  DLCO COR %Predicted % 104  TLC L 4.90  TLC % Predicted % 103  RV % Predicted % 118   Lab Results  Component Value Date   NITRICOXIDE 42 04/21/2017   Benralizumab SOSY 30 mg    Date Action Dose Route User   06/26/2018 1138 Given by Other 30 mg Subcutaneous (Right Arm) Scott, Tammy B     OV 07/18/18 - Acute Cough Patient presents today with a dry cough, wheezing, and sore throat.  She states that symptoms started 1 week ago.  Cough is worse at night.  She denies any recent fever.  She states that she has been doing well since started on Fasenra.  She states that she has not needed any prednisone in the past 2 years because she has been doing so well. She would like to avoid taking prednisone at this point.  She is requesting Tussionex today to help her rest at night because the cough is keeping her up.  Patient denies any sinus congestion or postnasal drip.  She denies any chest pain or edema.   Allergies  Allergen Reactions  . Beclomethasone Dipropionate Hives and Other (See Comments)     weight gain  . Budesonide-Formoterol Fumarate Hives  . Mometasone Furo-Formoterol Fum Hives and Other (See Comments)    weight gain  . Sulfonamide Derivatives Hives and Rash  . Pulmicort [Budesonide]     Headaches   .  Statins     Myalgias, RLS    Immunization History  Administered Date(s) Administered  . Influenza Split 04/20/2011  . Influenza Whole 06/06/2007, 04/15/2008, 04/02/2009, 03/29/2012  . Influenza, High Dose Seasonal PF 05/05/2015  . Influenza,inj,Quad PF,6+ Mos 05/09/2013, 03/03/2014, 05/03/2016, 04/21/2017, 04/02/2018  . Pneumococcal Conjugate-13 05/09/2013  . Pneumococcal Polysaccharide-23 05/04/2005  . Td 07/29/2009  . Tdap 03/11/2015    Past Medical History:  Diagnosis Date  . Allergic bronchopulmonary aspergillosis ([place]) 2008   sees Dr Edmund Hilda pulmonology  . Anemia    iron deficiency, resolved  . Anxiety   . Asthma   . CAD (coronary artery disease)    a. LHC 6/16:  oOM1 60, pRCA 25 >> med Rx b. cath 3/19 2nd OM with 95% stenosis s/p synergy DES & anomalous RCA  . CAP (community acquired pneumonia) 2016; 06/07/2016  . Chronic bronchitis ([place])   . Chronic lower back pain   . Complication of anesthesia    "think I have a hard time waking up from it"  . COPD (chronic obstructive pulmonary disease) ([place])   . Depression    mild  . Diverticulosis   . GERD (gastroesophageal reflux disease)   . H/O hiatal hernia   . Headache    "weekly" (08/23/2017)  . History of echocardiogram  Echo 6/16:  Mod LVH, EF 60-65%, no RWMA, Gr 1 DD, trivial MR, normal LA size.  Marland Kitchen Hyperglycemia 11/20/2015  . Hyperlipidemia, mixed 09/11/2007   Qualifier: Diagnosis of  By: Jerold Coombe   Did not tolerate Lipitor, zocor, Lovastatin, Pravastatin, Livalo, Crestor even low dose   . IBS (irritable bowel syndrome)   . Maxillary sinusitis   . Normal cardiac stress test 11/2011   No evidence of ischemia or infarct. Calculated ejection fraction 72%.  . Obesity   . OSA (obstructive sleep apnea) 02/2012   has stopped using  cpap  . Osteoarthritis   . Osteoporosis   . Pneumonia 11/2011   "before 2013 I hadn't had pneumonia since I was a child" (04/13/2012)  . Pulmonary nodules   . S/P  angioplasty with stent 08/23/17 ostial 2nd OM with DES synnergy 08/24/2017  . Schatzki's ring     Tobacco History: Social History   Tobacco Use  Smoking Status Never Smoker  Smokeless Tobacco Never Used   Counseling given: Not Answered   Outpatient Encounter Medications as of 07/18/2018  Medication Sig  . aspirin 81 MG tablet Take 81 mg by mouth daily.   . Calcium-Magnesium-Vitamin D 300-150-400 MG-MG-UNIT TABS Take 1 tablet by mouth daily.  . Cholecalciferol (VITAMIN D) 2000 UNITS CAPS Take 2,000 Units by mouth daily.   . clopidogrel (PLAVIX) 75 MG tablet TAKE 1 TABLET BY MOUTH DAILY WITH BREAKFAST.  Marland Kitchen Evolocumab (REPATHA SURECLICK) 106 MG/ML SOAJ Inject 1 pen into the skin every 14 (fourteen) days.  Marland Kitchen FASENRA 30 MG/ML SOSY Inject 1 Syringe into the muscle every 30 (thirty) days.  Marland Kitchen ipratropium-albuterol (DUONEB) 0.5-2.5 (3) MG/3ML SOLN INHALE THE CONTENTS OF 1 VIAL VIA NEBULIZER UP TO 4 TO 6 TIMES A DAY.  . Magnesium Oxide 250 MG TABS Take 250 mg by mouth daily.  . mometasone (ASMANEX, 60 METERED DOSES,) 220 MCG/INH inhaler Inhale 2 puffs into the lungs 2 (two) times daily.  . mupirocin ointment (BACTROBAN) 2 % Place 1 application into the nose 2 (two) times daily.  . nitroGLYCERIN (NITROSTAT) 0.4 MG SL tablet Place 1 tablet (0.4 mg total) under the tongue every 5 (five) minutes as needed for chest pain.  Marland Kitchen venlafaxine XR (EFFEXOR XR) 75 MG 24 hr capsule Take 1 capsule (75 mg total) by mouth daily.  Marland Kitchen venlafaxine XR (EFFEXOR-XR) 37.5 MG 24 hr capsule Take 1 capsule (37.5 mg total) by mouth daily.  . Zinc 30 MG CAPS Take 30 mg by mouth daily.  . chlorpheniramine-HYDROcodone (TUSSIONEX PENNKINETIC ER) 10-8 MG/5ML SUER Take 5 mLs by mouth every 12 (twelve) hours as needed for up to 12 days for cough (use sparingly).   No facility-administered encounter medications on file as of 07/18/2018.      Review of Systems  Review of Systems  Constitutional: Negative.  Negative for chills and  fever.  HENT: Positive for sore throat.   Respiratory: Positive for cough, shortness of breath and wheezing.   Cardiovascular: Negative.  Negative for chest pain, palpitations and leg swelling.  Gastrointestinal: Negative.   Allergic/Immunologic: Negative.   Neurological: Negative.   Psychiatric/Behavioral: Negative.        Physical Exam  BP 112/70 (BP Location: Left Arm, Cuff Size: Normal)   Pulse 92   Ht 5\' 1"  (1.549 m)   Wt 163 lb 12.8 oz (74.3 kg)   SpO2 100%   BMI 30.95 kg/m   Wt Readings from Last 5 Encounters:  07/18/18 163 lb 12.8 oz (74.3  kg)  05/24/18 162 lb 12.8 oz (73.8 kg)  05/22/18 162 lb (73.5 kg)  05/14/18 161 lb 12.8 oz (73.4 kg)  05/08/18 161 lb 6 oz (73.2 kg)     Physical Exam Vitals signs and nursing note reviewed.  Constitutional:      General: She is not in acute distress.    Appearance: She is well-developed.  HENT:     Mouth/Throat:     Pharynx: Posterior oropharyngeal erythema present. No oropharyngeal exudate.  Cardiovascular:     Rate and Rhythm: Normal rate and regular rhythm.  Pulmonary:     Effort: Pulmonary effort is normal.     Breath sounds: Normal breath sounds.  Musculoskeletal:        General: No swelling.  Neurological:     Mental Status: She is alert and oriented to person, place, and time.       Assessment & Plan:   Bronchitis with asthma, acute Patient would like to defer antibiotics and prednisone at this time. Requesting Tussionex. Discussed that I could give her a small bottle of Tussionex with no refills and to use it sparingly. Her last prescription of Tussionex was in 2017. If symptoms worsen please call - may need prednisone and/or antibiotic.   Patient Instructions  Will order tussionex twice daily as needed for cough - please use sparingly Will defer antibiotics today - please call if symptoms worsen  General Instructions:  Hydrate  Drink enough water to keep your pee (urine) clear or pale  yellow. Rest  Rest as much as possible.  Sleep with your head raised (elevated).  Make sure to get enough sleep each night. Other instructions  Put a warm, moist washcloth on your face 3-4 times a day or as told by your doctor. This will help with discomfort.  Wash your hands often with soap and water. If there is no soap and water, use hand sanitizer.  Do not smoke. Avoid being around people who are smoking (secondhand smoke). Call the office if:  You have a fever.  Your symptoms get worse.  Your symptoms do not get better within 10 days.  Follow up as scheduled with Dr. Chase Caller or sooner if needed        Fenton Foy, NP 07/18/2018

## 2018-07-19 DIAGNOSIS — L910 Hypertrophic scar: Secondary | ICD-10-CM | POA: Diagnosis not present

## 2018-07-19 DIAGNOSIS — Z23 Encounter for immunization: Secondary | ICD-10-CM | POA: Diagnosis not present

## 2018-07-25 NOTE — Telephone Encounter (Signed)
Copied from [place] [id number]. Topic: Quick Communication - See Telephone Encounter >> Jul 25, 2018  2:32 PM Vernona Rieger wrote: CRM for notification. See Telephone encounter for: 07/25/18.  Patient states she sent a mychart message on 1/20 and has not heard from [facility]. She would like to speak to [facility] [phone]

## 2018-07-26 ENCOUNTER — Telehealth: Payer: Self-pay | Admitting: Internal Medicine

## 2018-07-26 MED ORDER — PREDNISONE 10 MG PO TABS: ORAL_TABLET | ORAL | 0 refills | Status: DC

## 2018-07-26 NOTE — Telephone Encounter (Signed)
lmtcb x1 for pt. 

## 2018-07-26 NOTE — Telephone Encounter (Signed)
Spoke with the pt and notified that pred was sent  Nothing further needed

## 2018-07-26 NOTE — Telephone Encounter (Signed)
Patient was returning the office call not sure why the call but thinks it may about the prednisone//fmb

## 2018-07-26 NOTE — Telephone Encounter (Signed)
Spoke with the pt  She states her cough is not improving since she was here last on 07/18/2018  She is coughing up minimal clear sputum  She states she has had chest tightness off and on since the last visit  She denies any f/c/s, sore throat, nasal issues, increased SOB, wheezing  She is asking for prednisone to be called in  She is taking her tussionex sparingly  She declined ov  Please advise thanks!

## 2018-07-26 NOTE — Telephone Encounter (Signed)
Take prednisone 40 mg daily x 2 days, then 20mg  daily x 2 days, then 10mg  daily x 2 days, then 5mg  daily x 2 days and stop

## 2018-07-30 ENCOUNTER — Ambulatory Visit: Payer: Self-pay | Admitting: Family Medicine

## 2018-07-30 NOTE — Telephone Encounter (Signed)
Pt. Reports she would like to try the Fosamax weekly. Uses [facility]. Appointment made for follow up in April at pt.'s request. Please let pt. Know if this is sent in.

## 2018-07-31 MED ORDER — ALENDRONATE SODIUM 70 MG PO TABS: 70.0000 mg | ORAL_TABLET | ORAL | 5 refills | Status: DC

## 2018-07-31 NOTE — Telephone Encounter (Signed)
I called patient  She called me back to let me know that she wants to take the Fosamax I have sent the medication in to her pharmacy

## 2018-07-31 NOTE — Telephone Encounter (Signed)
Medication sent into patient's pharmacy  

## 2018-07-31 NOTE — Addendum Note (Signed)
Addended by: Magdalene Molly A on: 07/31/2018 07:42 AM   Modules accepted: Orders

## 2018-08-14 ENCOUNTER — Telehealth: Payer: Self-pay | Admitting: Internal Medicine

## 2018-08-14 ENCOUNTER — Other Ambulatory Visit: Payer: Self-pay

## 2018-08-14 MED ORDER — METRONIDAZOLE 250 MG PO TABS: 250.0000 mg | ORAL_TABLET | Freq: Three times a day (TID) | ORAL | 0 refills | Status: DC

## 2018-08-14 MED ORDER — CIPROFLOXACIN HCL 500 MG PO TABS: 500.0000 mg | ORAL_TABLET | Freq: Two times a day (BID) | ORAL | 0 refills | Status: DC

## 2018-08-14 NOTE — Telephone Encounter (Signed)
Spoke with pt and she is aware, scripts sent to pharmacy. 

## 2018-08-14 NOTE — Telephone Encounter (Signed)
Pt states she started having LLQ pain yesterday, she has not had a fever. Pt states it is painful when she sits and when she stands and walks, every tender. Pt wonders if she needs antibiotics. Please advise.

## 2018-08-14 NOTE — Telephone Encounter (Signed)
PT called states that is having LLQ abd pain, feels tender and has Diverticulitis but is not having a fever. Would like to know what to do.

## 2018-08-14 NOTE — Telephone Encounter (Signed)
Would start cipro 500 mg bid and metronidazole 250 mg TID x 7 days for diverticulitis She should call if not improving in 2 days and if worsening

## 2018-08-21 ENCOUNTER — Telehealth: Payer: Self-pay | Admitting: Internal Medicine

## 2018-08-21 NOTE — Telephone Encounter (Signed)
Called [place] [facility] to order pt's Fasenra. The rep put the order in. Pt. Appt. Is this Fri.. I don't think she gave me a del. Date. I had another pt., we went right in her case. I'll call back 08/22/2018

## 2018-08-22 NOTE — Telephone Encounter (Signed)
I was able to speak to a rep this time. She said pt's Victoria [name] was ordered this morning and will be here 08/23/2018 sometime.  1 prefilled syringe Ordered date: 08/22/2018 Shipping date: 08/22/2018  Waiting for fasenra to come in so I can record arrival.

## 2018-08-22 NOTE — Telephone Encounter (Signed)
Calling [facility], I haven't spoken to anyone yet I'm on eternal hold. (high call volume) Held for 10 mins., I called at 1:50.

## 2018-08-22 NOTE — Telephone Encounter (Signed)
TS please advise on update. Thank you.

## 2018-08-23 ENCOUNTER — Ambulatory Visit: Payer: PPO

## 2018-08-23 NOTE — Telephone Encounter (Signed)
Arrival Date:08/23/2018 Lot #: [id number] Exp date: 02/2020

## 2018-08-23 NOTE — Telephone Encounter (Signed)
TS please advise on update thank you.

## 2018-08-24 ENCOUNTER — Ambulatory Visit (INDEPENDENT_AMBULATORY_CARE_PROVIDER_SITE_OTHER): Payer: PPO

## 2018-08-24 DIAGNOSIS — J4550 Severe persistent asthma, uncomplicated: Secondary | ICD-10-CM

## 2018-08-24 MED ORDER — BENRALIZUMAB 30 MG/ML ~~LOC~~ SOSY
30.0000 mg | PREFILLED_SYRINGE | Freq: Once | SUBCUTANEOUS | Status: AC
  Administered 2018-08-24: 30 mg via SUBCUTANEOUS

## 2018-09-05 ENCOUNTER — Other Ambulatory Visit: Payer: Self-pay | Admitting: Family Medicine

## 2018-09-05 ENCOUNTER — Ambulatory Visit: Payer: PPO | Admitting: Podiatry

## 2018-09-06 ENCOUNTER — Telehealth: Payer: Self-pay | Admitting: Cardiology

## 2018-09-06 NOTE — Telephone Encounter (Signed)
He is currently scheduled to be seen on September 14, 2018, she can be rescheduled for 6 2 postpone to 6 to 12 weeks unless she has been experiencing new or worsening cardiac symptoms.  We will make sure she has enough of refills.  Ena Dawley, MD 09/06/2018

## 2018-09-07 NOTE — Telephone Encounter (Signed)
Called pt and reviewed chart Pt agreeable to postpone 3/27 appt Pt has no new cardiac symptoms Advised pt to call back if any new symptoms present Pt verbalized understanding Will route to COVID cancel pool

## 2018-09-14 ENCOUNTER — Ambulatory Visit: Payer: PPO | Admitting: Cardiology

## 2018-10-01 ENCOUNTER — Other Ambulatory Visit: Payer: Self-pay

## 2018-10-01 ENCOUNTER — Ambulatory Visit: Payer: PPO | Admitting: Family Medicine

## 2018-10-02 ENCOUNTER — Encounter: Payer: Self-pay | Admitting: *Deleted

## 2018-10-02 NOTE — Telephone Encounter (Signed)
Virtual Visit Pre-Appointment Phone Call  Steps For Call:  1. Confirm consent - "In the setting of the current Covid19 crisis, you are scheduled for a ( video) visit with Dr. Meda Coffee on 10/17/18 at 9:20 am.  Just as we do with many in-office visits, in order for you to participate in this visit, we must obtain consent.  If you'd like, I can send this to your mychart (if signed up) or email for you to review.  Otherwise, I can obtain your verbal consent now.  All virtual visits are billed to your insurance company just like a normal visit would be.  By agreeing to a virtual visit, we'd like you to understand that the technology does not allow for your provider to perform an examination, and thus may limit your provider's ability to fully assess your condition.  Finally, though the technology is pretty good, we cannot assure that it will always work on either your or our end, and in the setting of a video visit, we may have to convert it to a phone-only visit.  In either situation, we cannot ensure that we have a secure connection.  Are you willing to proceed?" STAFF: Did the patient verbally acknowledge consent to telehealth visit? YES AND PT IS AWARE THAT CONSENT INFORMATION WAS SENT TO HER ACTIVE MYCHART ACCOUNT FOR REVIEW PRIOR TO HER APPOINTMENT WITH DR Zena ON 4/29.  2. Confirm the BEST phone number to call the day of the visit by including in appointment notes-YES [phone]  3. Give patient instructions for DOXIMITY USE VIA HER SMARTPHONE  4. Advise patient to be prepared with their blood pressure, heart rate, weight, any heart rhythm information, their current medicines, and a piece of paper and pen handy for any instructions they may receive the day of their visit  5. Inform patient they will receive a phone call 15 minutes prior to their appointment time (may be from unknown caller ID) so they should be prepared to answer  6. Confirm that appointment type is correct in Epic appointment  notes (VIDEO visit with Dr. Meda Coffee via doximity on 4/29 )     TELEPHONE CALL NOTE  Victoria [name] has been deemed a candidate for a follow-up tele-health visit to limit community exposure during the Covid-19 pandemic. I spoke with the patient via phone to ensure availability of phone/video source, confirm preferred email & phone number, and discuss instructions and expectations.  I reminded Victoria [name] to be prepared with any vital sign and/or heart rhythm information that could potentially be obtained via home monitoring, at the time of her visit. I reminded Victoria [name] to expect a phone call at the time of her visit if her visit.  [name], LPN 3/61/4431 5:40 PM   IF USING DOXIMITY or DOXY.ME - The patient will receive a link just prior to their visit, either by text or email (to be determined day of appointment depending on if it's doxy.me or Doximity). YES THESE INSTRUCTIONS WERE ENDORSED TO THE PT.     FULL LENGTH CONSENT FOR TELE-HEALTH VISIT   I hereby voluntarily request, consent and authorize [facility] and its employed or contracted physicians, physician assistants, nurse practitioners or other licensed health care professionals (the Practitioner), to provide me with telemedicine health care services (the "Services") as deemed necessary by the treating Practitioner. I acknowledge and consent to receive the Services by the Practitioner via telemedicine. I understand that the telemedicine visit will involve communicating with the Practitioner  through live audiovisual communication technology and the disclosure of certain medical information by electronic transmission. I acknowledge that I have been given the opportunity to request an in-person assessment or other available alternative prior to the telemedicine visit and am voluntarily participating in the telemedicine visit.  I understand that I have the right to withhold or withdraw my consent to the use of telemedicine in  the course of my care at any time, without affecting my right to future care or treatment, and that the Practitioner or I may terminate the telemedicine visit at any time. I understand that I have the right to inspect all information obtained and/or recorded in the course of the telemedicine visit and may receive copies of available information for a reasonable fee.  I understand that some of the potential risks of receiving the Services via telemedicine include:  Marland Kitchen Delay or interruption in medical evaluation due to technological equipment failure or disruption; . Information transmitted may not be sufficient (e.g. poor resolution of images) to allow for appropriate medical decision making by the Practitioner; and/or  . In rare instances, security protocols could fail, causing a breach of personal health information.  Furthermore, I acknowledge that it is my responsibility to provide information about my medical history, conditions and care that is complete and accurate to the best of my ability. I acknowledge that Practitioner's advice, recommendations, and/or decision may be based on factors not within their control, such as incomplete or inaccurate data provided by me or distortions of diagnostic images or specimens that may result from electronic transmissions. I understand that the practice of medicine is not an exact science and that Practitioner makes no warranties or guarantees regarding treatment outcomes. I acknowledge that I will receive a copy of this consent concurrently upon execution via email to the email address I last provided but may also request a printed copy by calling the office of [facility].    I understand that my insurance will be billed for this visit.   I have read or had this consent read to me. . I understand the contents of this consent, which adequately explains the benefits and risks of the Services being provided via telemedicine.  . I have been provided ample  opportunity to ask questions regarding this consent and the Services and have had my questions answered to my satisfaction. . I give my informed consent for the services to be provided through the use of telemedicine in my medical care  By participating in this telemedicine visit I agree to the above.   PT IS AWARE THAT HER CONSENT TO TREAT HER VIA VIDEO VISIT WITH DR Meda Coffee ON 4/29 IS AVAILABLE FOR HER TO REVIEW VIA HER MYCHART ACCOUNT AND SHE SHOULD DO SO BEFORE THAT APPT.

## 2018-10-04 ENCOUNTER — Other Ambulatory Visit: Payer: Self-pay

## 2018-10-04 ENCOUNTER — Ambulatory Visit: Payer: PPO | Admitting: Family Medicine

## 2018-10-05 ENCOUNTER — Ambulatory Visit: Payer: PPO | Admitting: Family Medicine

## 2018-10-05 ENCOUNTER — Telehealth: Payer: Self-pay | Admitting: Family Medicine

## 2018-10-05 NOTE — Telephone Encounter (Signed)
Received voice message on the COVID 19 question line, asking about a Testing Site for being tested for Coronavirus, since she has been made aware that someone at her place of employment has been diagnosed with it.    Attempted to return call to pt.  Left voice message that [facility] is not currently doing any outpatient testing.  Advised that the testing is being done on pt's that are very ill, and being admitted to the hospital.  Encouraged to discuss with employer about safe practices in the work environment; ie: wearing mask, keeping minimum of 6 ft. distance from other coworkers, frequent handwashing, etc.  Advised working from home would be the best option, if that is possible.  Encouraged to call back with any further questions.

## 2018-10-09 ENCOUNTER — Other Ambulatory Visit: Payer: Self-pay

## 2018-10-09 ENCOUNTER — Ambulatory Visit (INDEPENDENT_AMBULATORY_CARE_PROVIDER_SITE_OTHER): Payer: PPO | Admitting: Family Medicine

## 2018-10-09 ENCOUNTER — Telehealth: Payer: Self-pay | Admitting: Family Medicine

## 2018-10-09 DIAGNOSIS — J4550 Severe persistent asthma, uncomplicated: Secondary | ICD-10-CM | POA: Diagnosis not present

## 2018-10-09 DIAGNOSIS — R739 Hyperglycemia, unspecified: Secondary | ICD-10-CM

## 2018-10-09 DIAGNOSIS — I5032 Chronic diastolic (congestive) heart failure: Secondary | ICD-10-CM | POA: Diagnosis not present

## 2018-10-09 DIAGNOSIS — R002 Palpitations: Secondary | ICD-10-CM | POA: Diagnosis not present

## 2018-10-09 DIAGNOSIS — Z8619 Personal history of other infectious and parasitic diseases: Secondary | ICD-10-CM | POA: Insufficient documentation

## 2018-10-09 DIAGNOSIS — M810 Age-related osteoporosis without current pathological fracture: Secondary | ICD-10-CM | POA: Diagnosis not present

## 2018-10-09 DIAGNOSIS — M79674 Pain in right toe(s): Secondary | ICD-10-CM | POA: Diagnosis not present

## 2018-10-09 DIAGNOSIS — E782 Mixed hyperlipidemia: Secondary | ICD-10-CM | POA: Diagnosis not present

## 2018-10-09 DIAGNOSIS — K219 Gastro-esophageal reflux disease without esophagitis: Secondary | ICD-10-CM | POA: Diagnosis not present

## 2018-10-09 MED ORDER — ACYCLOVIR 5 % EX CREA: 1.0000 "application " | TOPICAL_CREAM | CUTANEOUS | 2 refills | Status: DC

## 2018-10-09 MED ORDER — ACYCLOVIR 5 % EX OINT: 1.0000 "application " | TOPICAL_OINTMENT | CUTANEOUS | 2 refills | Status: DC

## 2018-10-09 NOTE — Assessment & Plan Note (Signed)
Doing well without daily meds, is using OTC meds infrequently with good results. Avoid offending foods. Do not eat large meals in late evening and consider raising head of bed.

## 2018-10-09 NOTE — Telephone Encounter (Signed)
Copied from [place] [id number]. Topic: General - Other >> Oct 09, 2018  2:29 PM Keene Breath wrote: Reason for CRM: Patient is returning a call to [facility] regarding scheduling an appt. For her and her son.  Please advise and call patient back at [phone]

## 2018-10-09 NOTE — Assessment & Plan Note (Signed)
asymptomatic

## 2018-10-09 NOTE — Telephone Encounter (Signed)
Copied from [place] [phone]. Topic: Quick Communication - Rx Refill/Question >> Oct 09, 2018 12:17 PM [place], [place] D wrote: Medication: acyclovir cream (ZOVIRAX) 5 % / Pharmacy is asking if they can use the ointment instead of cream due to extreme change in pricing. Cream is significantly more expensive. Please advise.  Has the patient contacted their pharmacy? Yes.   (Agent: If no, request that the patient contact the pharmacy for the refill.) (Agent: If yes, when and what did the pharmacy advise?)  Preferred Pharmacy (with phone number or street name): [place], [place] - [address] [phone] (Phone) [phone] (Fax)    Agent: Please be advised that RX refills may take up to 3 business days. We ask that you follow-up with your pharmacy.

## 2018-10-09 NOTE — Assessment & Plan Note (Signed)
hgba1c acceptable, minimize simple carbs. Increase exercise as tolerated.  

## 2018-10-09 NOTE — Assessment & Plan Note (Signed)
Zovirax cream prn is prescribed which has worked well for her in the past. No lesions at present

## 2018-10-09 NOTE — Telephone Encounter (Signed)
Please advise 

## 2018-10-09 NOTE — Assessment & Plan Note (Signed)
Is following with pulmonary and doing well on Fosenra and her next treatment is scheduled for May 5. She is trying to maintain social distancing and doing well sympomatically

## 2018-10-09 NOTE — Assessment & Plan Note (Signed)
5th toe red and swollen. Does not remember a specific trauma but does acknowledge she rolls over her toes with her son's wheelchair frequently. She has followed with podiatry, Dr Amalia Hailey at [facility] in past for similar pain and will follow up with them if pain persists. She will also have [name] check a uric acid level with next blood level

## 2018-10-09 NOTE — Telephone Encounter (Signed)
Left patient a msg to call back for a 3 month follow up

## 2018-10-09 NOTE — Assessment & Plan Note (Signed)
Tolerating Fosamax without concerning side effects. Will need repeat CT scan, last scan was 04/2018.

## 2018-10-09 NOTE — Telephone Encounter (Signed)
Medications sent in.

## 2018-10-09 NOTE — Addendum Note (Signed)
Addended by: Magdalene Molly A on: 10/09/2018 11:56 AM   Modules accepted: Orders

## 2018-10-09 NOTE — Telephone Encounter (Signed)
Called and scheduled appt for patient and son

## 2018-10-09 NOTE — Progress Notes (Signed)
Virtual Visit via Video Note  I connected with Victoria [name] on 10/09/18 at 10:30 AM EDT by a video enabled telemedicine application and verified that I am speaking with the correct person using two identifiers.   I discussed the limitations of evaluation and management by telemedicine and the availability of in person appointments. The patient expressed understanding and agreed to proceed. Victoria [name], CMA was able to get patient set up on video platform.     Subjective:    Patient ID: Victoria [name], female    DOB: 12/20/1955, 63 y.o.   MRN: [id number]  No chief complaint on file.   HPI Patient is in today for follow up on chronic medical concerns including osteoporosis, reflux, asthma, CAD and more. Overall she is doing well. No recent febrile illness or hospitalizations. In January she did have a bad respiratory illness with chills, possible low grade subjective fevers, sore throat and dry cough. Those symptoms have improved but she is wondering if she and her son had Covid in January. She had a flare in diverticulitis well treated by GI with Cipro and Flagyl. She has had some trouble with recurrent cold sores but that has improved. Denies CP/palp/HA/congestion/fevers/GI or GU c/o. Taking meds as prescribed  Past Medical History:  Diagnosis Date  . 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 Anemia    iron deficiency, resolved  . Anxiety   . Asthma   . CAD (coronary artery disease)    a. LHC 6/16:  oOM1 60, pRCA 25 >> med Rx b. cath 3/19 2nd OM with 95% stenosis s/p synergy DES & anomalous RCA  . CAP (community acquired pneumonia) 2016; 06/07/2016  . Chronic bronchitis ([place])   . Chronic lower back pain   . Complication of anesthesia    "think I have a hard time waking up from it"  . COPD (chronic obstructive pulmonary disease) ([place])   . Depression    mild  . Diverticulosis   . GERD (gastroesophageal reflux disease)   .  H/O hiatal hernia   . Headache    "weekly" (08/23/2017)  . History of echocardiogram    Echo 6/16:  Mod LVH, EF 60-65%, no RWMA, Gr 1 DD, trivial MR, normal LA size.  Marland Kitchen Hyperglycemia 11/20/2015  . Hyperlipidemia, mixed 09/11/2007   Qualifier: Diagnosis of  By: Jerold Coombe   Did not tolerate Lipitor, zocor, Lovastatin, Pravastatin, Livalo, Crestor even low dose   . IBS (irritable bowel syndrome)   . Maxillary sinusitis   . Normal cardiac stress test 11/2011   No evidence of ischemia or infarct. Calculated ejection fraction 72%.  . Obesity   . OSA (obstructive sleep apnea) 02/2012   has stopped using  cpap  . Osteoarthritis   . Osteoporosis   . Pneumonia 11/2011   "before 2013 I hadn't had pneumonia since I was a child" (04/13/2012)  . Pulmonary nodules   . S/P angioplasty with stent 08/23/17 ostial 2nd OM with DES synnergy 08/24/2017  . Schatzki's ring     Past Surgical History:  Procedure Laterality Date  . APPENDECTOMY  1989  . CARDIAC CATHETERIZATION N/A 11/25/2014   Procedure: Right/Left Heart Cath and Coronary Angiography;  Surgeon: Belva Crome, MD;  Location: [place] [facility];  Service: Cardiovascular;  Laterality: N/A;  . CESAREAN SECTION  1985  . CORONARY ANGIOPLASTY WITH STENT PLACEMENT  08/23/2017  . CORONARY STENT INTERVENTION N/A 08/23/2017   Procedure: CORONARY STENT  INTERVENTION;  Surgeon: Burnell Blanks, MD;  Location: [place] [facility];  Service: Cardiovascular;  Laterality: N/A;  . HERNIA REPAIR  04/13/2012   VHR laparoscopic  . LEFT HEART CATH AND CORONARY ANGIOGRAPHY N/A 08/23/2017   Procedure: LEFT HEART CATH AND CORONARY ANGIOGRAPHY;  Surgeon: Burnell Blanks, MD;  Location: [place] [facility];  Service: Cardiovascular;  Laterality: N/A;  . VENTRAL HERNIA REPAIR  04/13/2012   Procedure: LAPAROSCOPIC VENTRAL HERNIA;  Surgeon: Adin Hector, MD;  Location: [facility];  Service: General;  Laterality: N/A;  laparoscopic repair of incarcerated hernia     Family History  Problem Relation Age of Onset  . Breast cancer Mother   . Hypertension Mother   . Diabetes Mother   . Diverticulosis Father   . Prostate cancer Father   . Pulmonary embolism Brother        recurrent  . Heart attack Maternal Grandfather   . Breast cancer Sister   . Stroke Neg Hx     Social History   Socioeconomic History  . Marital status: Married    Spouse name: Not on file  . Number of children: 1  . Years of education: Not on file  . Highest education level: Not on file  Occupational History    Employer: UNEMPLOYED  Social Needs  . Financial resource strain: Not on file  . Food insecurity:    Worry: Not on file    Inability: Not on file  . Transportation needs:    Medical: Not on file    Non-medical: Not on file  Tobacco Use  . Smoking status: Never Smoker  . Smokeless tobacco: Never Used  Substance and Sexual Activity  . Alcohol use: Yes    Alcohol/week: 4.0 standard drinks    Types: 2 Glasses of wine, 2 Cans of beer per week  . Drug use: No  . Sexual activity: Yes    Comment: gluten free, lives with husband and son with CP quadriplegia  Lifestyle  . Physical activity:    Days per week: Not on file    Minutes per session: Not on file  . Stress: Not on file  Relationships  . Social connections:    Talks on phone: Not on file    Gets together: Not on file    Attends religious service: Not on file    Active member of club or organization: Not on file    Attends meetings of clubs or organizations: Not on file    Relationship status: Not on file  . Intimate partner violence:    Fear of current or ex partner: Not on file    Emotionally abused: Not on file    Physically abused: Not on file    Forced sexual activity: Not on file  Other Topics Concern  . Not on file  Social History Narrative   Cares for a 15yo son with cerebral palsy.     Outpatient Medications Prior to Visit  Medication Sig Dispense Refill  . alendronate (FOSAMAX) 70 MG  tablet Take 1 tablet (70 mg total) by mouth every 7 (seven) days. Take with a full glass of water on an empty stomach. 4 tablet 5  . aspirin 81 MG tablet Take 81 mg by mouth daily.     . Calcium-Magnesium-Vitamin D 300-150-400 MG-MG-UNIT TABS Take 1 tablet by mouth daily.    . Cholecalciferol (VITAMIN D) 2000 UNITS CAPS Take 2,000 Units by mouth daily.     . clopidogrel (PLAVIX) 75  MG tablet TAKE 1 TABLET BY MOUTH DAILY WITH BREAKFAST. 30 tablet 6  . Evolocumab (REPATHA SURECLICK) 782 MG/ML SOAJ Inject 1 pen into the skin every 14 (fourteen) days. 6 pen 3  . FASENRA 30 MG/ML SOSY Inject 1 Syringe into the muscle every 30 (thirty) days.    Marland Kitchen ipratropium-albuterol (DUONEB) 0.5-2.5 (3) MG/3ML SOLN INHALE THE CONTENTS OF 1 VIAL VIA NEBULIZER UP TO 4 TO 6 TIMES A DAY. 60 mL 2  . Magnesium Oxide 250 MG TABS Take 250 mg by mouth daily.    . mometasone (ASMANEX, 60 METERED DOSES,) 220 MCG/INH inhaler Inhale 2 puffs into the lungs 2 (two) times daily. 1 Inhaler 12  . mupirocin ointment (BACTROBAN) 2 % Place 1 application into the nose 2 (two) times daily. 22 g 0  . nitroGLYCERIN (NITROSTAT) 0.4 MG SL tablet Place 1 tablet (0.4 mg total) under the tongue every 5 (five) minutes as needed for chest pain. 25 tablet 11  . predniSONE (DELTASONE) 10 MG tablet 4 x 2 days, 2 x 2 days, 1 x 2 days, 1/2 x 2 days, then stop 15 tablet 0  . venlafaxine XR (EFFEXOR-XR) 37.5 MG 24 hr capsule TAKE 1 CAPSULE BY MOUTH DAILY ALONG WITH 75 MG CAPSULE (112.5 MG TOTAL DAILY) 180 capsule 1  . venlafaxine XR (EFFEXOR-XR) 75 MG 24 hr capsule TAKE 1 CAPSULE BY MOUTH ONCE DAILY ALONG WITH 37.5 MG CAPSULE DAILY. (112.5 MG TOTAL DAILY) 180 capsule 1  . Zinc 30 MG CAPS Take 30 mg by mouth daily.    . ciprofloxacin (CIPRO) 500 MG tablet Take 1 tablet (500 mg total) by mouth 2 (two) times daily. 14 tablet 0  . metroNIDAZOLE (FLAGYL) 250 MG tablet Take 1 tablet (250 mg total) by mouth 3 (three) times daily. 21 tablet 0   No  facility-administered medications prior to visit.     Allergies  Allergen Reactions  . Beclomethasone Dipropionate Hives and Other (See Comments)     weight gain  . Budesonide-Formoterol Fumarate Hives  . Mometasone Furo-Formoterol Fum Hives and Other (See Comments)    weight gain  . Sulfonamide Derivatives Hives and Rash  . Pulmicort [Budesonide]     Headaches   . Statins     Myalgias, RLS    Review of Systems  Constitutional: Negative for fever and malaise/fatigue.  HENT: Negative for congestion.   Eyes: Negative for blurred vision.  Respiratory: Negative for shortness of breath.   Cardiovascular: Negative for chest pain, palpitations and leg swelling.  Gastrointestinal: Positive for heartburn. Negative for abdominal pain, blood in stool and nausea.  Genitourinary: Negative for dysuria and frequency.  Musculoskeletal: Negative for falls.  Skin: Negative for rash.  Neurological: Negative for dizziness, loss of consciousness and headaches.  Endo/Heme/Allergies: Negative for environmental allergies.  Psychiatric/Behavioral: Negative for depression. The patient is not nervous/anxious.        Objective:    Physical Exam Constitutional:      Appearance: Normal appearance. She is not ill-appearing.  HENT:     Head: Normocephalic and atraumatic.     Nose: Nose normal.  Pulmonary:     Effort: Pulmonary effort is normal.  Neurological:     Mental Status: She is alert and oriented to person, place, and time.  Psychiatric:        Mood and Affect: Mood normal.        Behavior: Behavior normal.     There were no vitals taken for this visit. Wt Readings from Last  3 Encounters:  07/18/18 163 lb 12.8 oz (74.3 kg)  05/24/18 162 lb 12.8 oz (73.8 kg)  05/22/18 162 lb (73.5 kg)    Diabetic Foot Exam - Simple   No data filed     Lab Results  Component Value Date   WBC 6.8 04/02/2018   HGB 13.0 04/02/2018   HCT 39.0 04/02/2018   PLT 298.0 04/02/2018   GLUCOSE 93  05/22/2018   CHOL 150 04/02/2018   TRIG 132.0 04/02/2018   HDL 54.50 04/02/2018   LDLDIRECT 177.8 09/07/2011   LDLCALC 69 04/02/2018   ALT 19 05/22/2018   AST 18 05/22/2018   NA 136 05/22/2018   K 3.8 05/22/2018   CL 100 05/22/2018   CREATININE 0.62 05/22/2018   BUN 14 05/22/2018   CO2 28 05/22/2018   TSH 1.51 04/02/2018   INR 1.0 08/21/2017   HGBA1C 5.5 04/02/2018    Lab Results  Component Value Date   TSH 1.51 04/02/2018   Lab Results  Component Value Date   WBC 6.8 04/02/2018   HGB 13.0 04/02/2018   HCT 39.0 04/02/2018   MCV 91.0 04/02/2018   PLT 298.0 04/02/2018   Lab Results  Component Value Date   NA 136 05/22/2018   K 3.8 05/22/2018   CO2 28 05/22/2018   GLUCOSE 93 05/22/2018   BUN 14 05/22/2018   CREATININE 0.62 05/22/2018   BILITOT 0.3 05/22/2018   ALKPHOS 93 05/22/2018   AST 18 05/22/2018   ALT 19 05/22/2018   PROT 6.7 05/22/2018   ALBUMIN 4.4 05/22/2018   CALCIUM 9.6 05/22/2018   ANIONGAP 6 08/24/2017   GFR 103.99 05/22/2018   Lab Results  Component Value Date   CHOL 150 04/02/2018   Lab Results  Component Value Date   HDL 54.50 04/02/2018   Lab Results  Component Value Date   LDLCALC 69 04/02/2018   Lab Results  Component Value Date   TRIG 132.0 04/02/2018   Lab Results  Component Value Date   CHOLHDL 3 04/02/2018   Lab Results  Component Value Date   HGBA1C 5.5 04/02/2018       Assessment & Plan:   Problem List Items Addressed This Visit    Hyperglycemia (Chronic)    hgba1c acceptable, minimize simple carbs. Increase exercise as tolerated.       Hyperlipidemia, mixed    Encouraged heart healthy diet, increase exercise, avoid trans fats, consider a krill oil cap daily      Acid reflux    Doing well without daily meds, is using OTC meds infrequently with good results. Avoid offending foods. Do not eat large meals in late evening and consider raising head of bed.       Chronic diastolic CHF (congestive heart failure),  NYHA class 2 (HCC)    asymptomatic      Severe persistent asthma with intensive monitoring    Is following with pulmonary and doing well on Fosenra and her next treatment is scheduled for May 5. She is trying to maintain social distancing and doing well sympomatically      Osteoporosis    Tolerating Fosamax without concerning side effects. Will need repeat CT scan, last scan was 04/2018.      Toe pain, right    5th toe red and swollen. Does not remember a specific trauma but does acknowledge she rolls over her toes with her son's wheelchair frequently. She has followed with podiatry, Dr Amalia Hailey at [facility] in past for similar pain and  will follow up with them if pain persists. She will also have [name] check a uric acid level with next blood level      Hx of cold sores    Zovirax cream prn is prescribed which has worked well for her in the past. No lesions at present         I have discontinued Tenna Child. Chandonnet's ciprofloxacin and metroNIDAZOLE. I am also having her start on acyclovir cream. Additionally, I am having her maintain her Vitamin D, aspirin, nitroGLYCERIN, Fasenra, Magnesium Oxide, Calcium-Magnesium-Vitamin D, clopidogrel, mupirocin ointment, Zinc, mometasone, ipratropium-albuterol, Evolocumab, predniSONE, alendronate, venlafaxine XR, and venlafaxine XR.  Meds ordered this encounter  Medications  . acyclovir cream (ZOVIRAX) 5 %    Sig: Apply 1 application topically every 3 (three) hours.    Dispense:  15 g    Refill:  2    I discussed the assessment and treatment plan with the patient. The patient was provided an opportunity to ask questions and all were answered. The patient agreed with the plan and demonstrated an understanding of the instructions.   The patient was advised to call back or seek an in-person evaluation if the symptoms worsen or if the condition fails to improve as anticipated.  I provided 25 minutes of non-face-to-face time during this encounter.    [name], MD

## 2018-10-09 NOTE — Telephone Encounter (Signed)
Ok to change from cream to ointment

## 2018-10-09 NOTE — Assessment & Plan Note (Signed)
Encouraged heart healthy diet, increase exercise, avoid trans fats, consider a krill oil cap daily 

## 2018-10-16 ENCOUNTER — Telehealth: Payer: Self-pay | Admitting: Internal Medicine

## 2018-10-16 MED ORDER — BENRALIZUMAB 30 MG/ML ~~LOC~~ SOSY: 1.0000 mL | PREFILLED_SYRINGE | SUBCUTANEOUS | 11 refills | Status: DC

## 2018-10-16 NOTE — Telephone Encounter (Addendum)
[name] Order: 30mg  #1 prefilled syringe Ordered date: 10/16/2018 Expected shipping date from pharmacy: N/A Ordered by: Desmond Dike, Erma: [employer]   While speaking to [employer], I was advised that the pt's prescription has expired. A new rx will need to be faxed to [phone]. This has been done.

## 2018-10-17 ENCOUNTER — Other Ambulatory Visit: Payer: Self-pay

## 2018-10-17 ENCOUNTER — Encounter: Payer: Self-pay | Admitting: Cardiology

## 2018-10-17 ENCOUNTER — Telehealth: Payer: Self-pay | Admitting: Pharmacist

## 2018-10-17 ENCOUNTER — Encounter: Payer: Self-pay | Admitting: *Deleted

## 2018-10-17 ENCOUNTER — Telehealth (INDEPENDENT_AMBULATORY_CARE_PROVIDER_SITE_OTHER): Payer: PPO | Admitting: Cardiology

## 2018-10-17 DIAGNOSIS — E782 Mixed hyperlipidemia: Secondary | ICD-10-CM | POA: Diagnosis not present

## 2018-10-17 DIAGNOSIS — I2510 Atherosclerotic heart disease of native coronary artery without angina pectoris: Secondary | ICD-10-CM

## 2018-10-17 MED ORDER — EVOLOCUMAB 140 MG/ML ~~LOC~~ SOAJ: 1.0000 "pen " | SUBCUTANEOUS | 3 refills | Status: DC

## 2018-10-17 NOTE — Telephone Encounter (Signed)
Called pt to inquire about new insurance, she states she still has [employer] but has not been able to get her refills of Repatha because her authorization expired in April. Will submit prior authorization for continuation of therapy.

## 2018-10-17 NOTE — Telephone Encounter (Signed)
PA for Repatha approved through 10/17/2019. Patient made aware. New Rx sent to [facility]

## 2018-10-17 NOTE — Patient Instructions (Signed)
Medication Instructions:   Your physician recommends that you continue on your current medications as directed. Please refer to the Current Medication list given to you today.  If you need a refill on your cardiac medications before your next appointment, please call your pharmacy.     Follow-Up: At [facility], you and your health needs are our priority.  As part of our continuing mission to provide you with exceptional heart care, we have created designated Provider Care Teams.  These Care Teams include your primary Cardiologist (physician) and Advanced Practice Providers (APPs -  Physician Assistants and Nurse Practitioners) who all work together to provide you with the care you need, when you need it.  Your physician wants you to follow-up in: [place] will receive a reminder letter in the mail two months in advance. If you don't receive a letter, please call our office to schedule the follow-up appointment.

## 2018-10-17 NOTE — Progress Notes (Signed)
Virtual Visit via Video Note   This visit type was conducted due to national recommendations for restrictions regarding the COVID-19 Pandemic (e.g. social distancing) in an effort to limit this patient's exposure and mitigate transmission in our community.  Due to her co-morbid illnesses, this patient is at least at moderate risk for complications without adequate follow up.  This format is felt to be most appropriate for this patient at this time.  All issues noted in this document were discussed and addressed.  A limited physical exam was performed with this format.  Please refer to the patient's chart for her consent to telehealth for [facility].   Evaluation Performed:  Follow-up visit  Date:  10/17/2018   ID:  Victoria [name], Victoria [name] 03-Sep-1955, MRN [id number]  Patient Location: Home Provider Location: Home  PCP:  Mosie Lukes, MD  Cardiologist:  Ena Dawley, MD  Electrophysiologist:  None   Chief Complaint:  None  History of Present Illness:    Victoria [name] is a 63 y.o. female with h/o CAD. She underwent cardiac catheterization for CP in March of this year which showed single vessel obstructive CAD with severe stenosis in the ostium of the large obtuse marginal branch. This was treated with successful PTCA/DES x1. LVEF was normal. She also has a h/o GERD and hiatal hernia. She was previously on omeprazole, which was controlling her reflux, but this was discontinued in March after she got her stent and was placed on DAPT w/ ASA and Plavix. She was switched to Protonix but did not tolerate this due to side effects. She has since been using a OTC H2 blocker, Zantac, even despite the recall. She also has HLD which is controlled with Repatha. Recent lipid panel showed LDL at 69 mg/dL.  05/24/2018 -disease 1 month follow-up, she was here at the end of October for complaints of chest pain, since then she switched her PPI medicines and completed antibiotics for lower extremity  cellulitis and her pain has completely resolved.  She walks daily and has no symptoms of chest pain or shortness of breath.  She has been compliant with her meds.  10/17/18 - she has been doing well, walks almost daily and has no symptoms. She has been compliant with her meds but because of change in insurance policy she hasn't received Repatha in the last 30 days.  The patient does not have symptoms concerning for COVID-19 infection (fever, chills, cough, or new shortness of breath).    Past Medical History:  Diagnosis Date  . 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 Anemia    iron deficiency, resolved  . Anxiety   . Asthma   . CAD (coronary artery disease)    a. LHC 6/16:  oOM1 60, pRCA 25 >> med Rx b. cath 3/19 2nd OM with 95% stenosis s/p synergy DES & anomalous RCA  . CAP (community acquired pneumonia) 2016; 06/07/2016  . Chronic bronchitis ([place])   . Chronic lower back pain   . Complication of anesthesia    "think I have a hard time waking up from it"  . COPD (chronic obstructive pulmonary disease) ([place])   . Depression    mild  . Diverticulosis   . GERD (gastroesophageal reflux disease)   . H/O hiatal hernia   . Headache    "weekly" (08/23/2017)  . History of echocardiogram    Echo 6/16:  Mod LVH, EF 60-65%, no RWMA, Gr 1 DD, trivial  MR, normal LA size.  Marland Kitchen Hyperglycemia 11/20/2015  . Hyperlipidemia, mixed 09/11/2007   Qualifier: Diagnosis of  By: Jerold Coombe   Did not tolerate Lipitor, zocor, Lovastatin, Pravastatin, Livalo, Crestor even low dose   . IBS (irritable bowel syndrome)   . Maxillary sinusitis   . Normal cardiac stress test 11/2011   No evidence of ischemia or infarct. Calculated ejection fraction 72%.  . Obesity   . OSA (obstructive sleep apnea) 02/2012   has stopped using  cpap  . Osteoarthritis   . Osteoporosis   . Pneumonia 11/2011   "before 2013 I hadn't had pneumonia since I was a child"  (04/13/2012)  . Pulmonary nodules   . S/P angioplasty with stent 08/23/17 ostial 2nd OM with DES synnergy 08/24/2017  . Schatzki's ring    Past Surgical History:  Procedure Laterality Date  . APPENDECTOMY  1989  . CARDIAC CATHETERIZATION N/A 11/25/2014   Procedure: Right/Left Heart Cath and Coronary Angiography;  Surgeon: Belva Crome, MD;  Location: [place] [facility];  Service: Cardiovascular;  Laterality: N/A;  . CESAREAN SECTION  1985  . CORONARY ANGIOPLASTY WITH STENT PLACEMENT  08/23/2017  . CORONARY STENT INTERVENTION N/A 08/23/2017   Procedure: CORONARY STENT INTERVENTION;  Surgeon: Burnell Blanks, MD;  Location: [place] [facility];  Service: Cardiovascular;  Laterality: N/A;  . HERNIA REPAIR  04/13/2012   VHR laparoscopic  . LEFT HEART CATH AND CORONARY ANGIOGRAPHY N/A 08/23/2017   Procedure: LEFT HEART CATH AND CORONARY ANGIOGRAPHY;  Surgeon: Burnell Blanks, MD;  Location: [place] [facility];  Service: Cardiovascular;  Laterality: N/A;  . VENTRAL HERNIA REPAIR  04/13/2012   Procedure: LAPAROSCOPIC VENTRAL HERNIA;  Surgeon: Adin Hector, MD;  Location: [place];  Service: General;  Laterality: N/A;  laparoscopic repair of incarcerated hernia     Current Meds  Medication Sig  . acyclovir ointment (ZOVIRAX) 5 % Apply 1 application topically every 3 (three) hours.  Marland Kitchen alendronate (FOSAMAX) 70 MG tablet Take 1 tablet (70 mg total) by mouth every 7 (seven) days. Take with a full glass of water on an empty stomach.  Marland Kitchen aspirin 81 MG tablet Take 81 mg by mouth daily.   . Benralizumab (FASENRA) 30 MG/ML SOSY Inject 1 mL into the skin every 8 (eight) weeks.  Marland Kitchen Bioflavonoid Products (ESTER C PO) Take 500 mg by mouth daily.  . Calcium-Magnesium-Vitamin D 300-150-400 MG-MG-UNIT TABS Take 1 tablet by mouth daily.  . Cholecalciferol (VITAMIN D) 2000 UNITS CAPS Take 2,000 Units by mouth daily.   . Magnesium Oxide 250 MG TABS Take 250 mg by mouth daily.  . nitroGLYCERIN (NITROSTAT)  0.4 MG SL tablet Place 1 tablet (0.4 mg total) under the tongue every 5 (five) minutes as needed for chest pain.  Marland Kitchen venlafaxine XR (EFFEXOR-XR) 37.5 MG 24 hr capsule TAKE 1 CAPSULE BY MOUTH DAILY ALONG WITH 75 MG CAPSULE (112.5 MG TOTAL DAILY)  . venlafaxine XR (EFFEXOR-XR) 75 MG 24 hr capsule TAKE 1 CAPSULE BY MOUTH ONCE DAILY ALONG WITH 37.5 MG CAPSULE DAILY. (112.5 MG TOTAL DAILY)  . Zinc 30 MG CAPS Take 30 mg by mouth daily.     Allergies:   Beclomethasone dipropionate; Budesonide-formoterol fumarate; Mometasone furo-formoterol fum; Sulfonamide derivatives; Pulmicort [budesonide]; and Statins   Social History   Tobacco Use  . Smoking status: Never Smoker  . Smokeless tobacco: Never Used  Substance Use Topics  . Alcohol use: Yes    Alcohol/week: 4.0 standard drinks  Types: 2 Glasses of wine, 2 Cans of beer per week  . Drug use: No     Family Hx: The patient's family history includes Breast cancer in her mother and sister; Diabetes in her mother; Diverticulosis in her father; Heart attack in her maternal grandfather; Hypertension in her mother; Prostate cancer in her father; Pulmonary embolism in her brother. There is no history of Stroke.  ROS:   Please see the history of present illness.    All other systems reviewed and are negative.   Prior CV studies:   The following studies were reviewed today:  Labs/Other Tests and Data Reviewed:    EKG:  No ECG reviewed.  Recent Labs: 04/02/2018: Hemoglobin 13.0; Platelets 298.0; TSH 1.51 05/22/2018: ALT 19; BUN 14; Creatinine, Ser 0.62; Potassium 3.8; Sodium 136   Recent Lipid Panel Lab Results  Component Value Date/Time   CHOL 150 04/02/2018 03:16 PM   CHOL 227 (H) 07/25/2016 08:22 AM   TRIG 132.0 04/02/2018 03:16 PM   HDL 54.50 04/02/2018 03:16 PM   HDL 51 07/25/2016 08:22 AM   CHOLHDL 3 04/02/2018 03:16 PM   LDLCALC 69 04/02/2018 03:16 PM   LDLCALC 160 (H) 07/25/2016 08:22 AM   LDLDIRECT 177.8 09/07/2011 09:45 AM     Wt Readings from Last 3 Encounters:  10/17/18 161 lb (73 kg)  07/18/18 163 lb 12.8 oz (74.3 kg)  05/24/18 162 lb 12.8 oz (73.8 kg)     Objective:    Vital Signs:  BP 125/71   Pulse 76   Ht 5\' 1"  (1.549 m)   Wt 161 lb (73 kg)   SpO2 98%   BMI 30.42 kg/m    VITAL SIGNS:  reviewed  ASSESSMENT & PLAN:    1. CAD/ Chest Pain: status post cardiac catheterization March 2019 showing severe single-vessel CAD with severe stenosis in the ostium of the large obtuse marginal branch.  This was successfully treated with PTCA/DES x1 to the ostium of the OM.   She is currently asymptomatic. She is encouraged to continue taking Plavix as long as tolerated.  2.  Hyperlipidemia -ran out of Repatha, lipids previously at goal, we will see net week what are the results without Repatha.  COVID-19 Education: The signs and symptoms of COVID-19 were discussed with the patient and how to seek care for testing (follow up with PCP or arrange E-visit).  The importance of social distancing was discussed today.  Time:   Today, I have spent 15 minutes with the patient with telehealth technology discussing the above problems.     Medication Adjustments/Labs and Tests Ordered: Current medicines are reviewed at length with the patient today.  Concerns regarding medicines are outlined above.   Tests Ordered: No orders of the defined types were placed in this encounter.   Medication Changes: No orders of the defined types were placed in this encounter.   Disposition:  Follow up in 6 month(s)  Signed, Ena Dawley, MD  10/17/2018 9:32 AM    [facility]

## 2018-10-19 NOTE — Telephone Encounter (Signed)
Fasenra Shipment Received:  30mg  #1 prefilled syringe Medication arrival Date:10/19/2018 Lot #: [id number]  Medication exp date: 12/2019 Received by: Desmond Dike, CMA

## 2018-10-22 ENCOUNTER — Telehealth: Payer: Self-pay | Admitting: Internal Medicine

## 2018-10-22 NOTE — Telephone Encounter (Signed)
Pt returned call. Pt aware that Benay Spice will be out of the office tomorrow 05/05.

## 2018-10-22 NOTE — Telephone Encounter (Signed)
Irma, our phlebotomist at [facility] will not be at office tomorrow, 5/5. Pt will either need to go to [facility] to get labwork done or go to her PCP to get the labs drawn.  Attempted to call pt to let her know this info but unable to reach pt. Left message for pt to return call. When pt does return call, we will let her know the above stated.

## 2018-10-23 ENCOUNTER — Other Ambulatory Visit: Payer: Self-pay

## 2018-10-23 ENCOUNTER — Ambulatory Visit (INDEPENDENT_AMBULATORY_CARE_PROVIDER_SITE_OTHER): Payer: PPO

## 2018-10-23 DIAGNOSIS — J4550 Severe persistent asthma, uncomplicated: Secondary | ICD-10-CM | POA: Diagnosis not present

## 2018-10-23 MED ORDER — BENRALIZUMAB 30 MG/ML ~~LOC~~ SOSY
30.0000 mg | PREFILLED_SYRINGE | Freq: Once | SUBCUTANEOUS | Status: AC
  Administered 2018-10-23: 30 mg via SUBCUTANEOUS

## 2018-10-23 NOTE — Progress Notes (Signed)
Have you been hospitalized within the last 10 days?  No Do you have a fever?  No Do you have a cough?  No Do you have a headache or sore throat? No  

## 2018-10-24 ENCOUNTER — Encounter: Payer: Self-pay | Admitting: Family Medicine

## 2018-10-30 ENCOUNTER — Other Ambulatory Visit (INDEPENDENT_AMBULATORY_CARE_PROVIDER_SITE_OTHER): Payer: PPO

## 2018-10-30 ENCOUNTER — Encounter: Payer: Self-pay | Admitting: Family Medicine

## 2018-10-30 ENCOUNTER — Other Ambulatory Visit: Payer: Self-pay

## 2018-10-30 DIAGNOSIS — M79674 Pain in right toe(s): Secondary | ICD-10-CM | POA: Diagnosis not present

## 2018-10-30 DIAGNOSIS — R739 Hyperglycemia, unspecified: Secondary | ICD-10-CM | POA: Diagnosis not present

## 2018-10-30 DIAGNOSIS — E782 Mixed hyperlipidemia: Secondary | ICD-10-CM

## 2018-10-30 DIAGNOSIS — R002 Palpitations: Secondary | ICD-10-CM

## 2018-10-30 LAB — COMPREHENSIVE METABOLIC PANEL
ALT: 18 U/L (ref 0–35)
AST: 18 U/L (ref 0–37)
Albumin: 4.2 g/dL (ref 3.5–5.2)
Alkaline Phosphatase: 87 U/L (ref 39–117)
BUN: 15 mg/dL (ref 6–23)
CO2: 29 mEq/L (ref 19–32)
Calcium: 8.9 mg/dL (ref 8.4–10.5)
Chloride: 101 mEq/L (ref 96–112)
Creatinine, Ser: 0.62 mg/dL (ref 0.40–1.20)
GFR: 97.7 mL/min (ref >60.00)
Glucose, Bld: 86 mg/dL (ref 70–99)
Potassium: 4.4 mEq/L (ref 3.5–5.1)
Sodium: 137 mEq/L (ref 135–145)
Total Bilirubin: 0.4 mg/dL (ref 0.2–1.2)
Total Protein: 6.3 g/dL (ref 6.0–8.3)

## 2018-10-30 LAB — CBC WITH DIFFERENTIAL/PLATELET
Basophils Absolute: 0 10*3/uL (ref 0.0–0.1)
Basophils Relative: 0.2 % (ref 0.0–3.0)
Eosinophils Absolute: 0 10*3/uL (ref 0.0–0.7)
Eosinophils Relative: 0 % (ref 0.0–5.0)
HCT: 38.5 % (ref 36.0–46.0)
Hemoglobin: 13.1 g/dL (ref 12.0–15.0)
Lymphocytes Relative: 32.8 % (ref 12.0–46.0)
Lymphs Abs: 1.4 10*3/uL (ref 0.7–4.0)
MCHC: 34 g/dL (ref 30.0–36.0)
MCV: 88.8 fl (ref 78.0–100.0)
Monocytes Absolute: 0.4 10*3/uL (ref 0.1–1.0)
Monocytes Relative: 8.5 % (ref 3.0–12.0)
Neutro Abs: 2.6 10*3/uL (ref 1.4–7.7)
Neutrophils Relative %: 58.5 % (ref 43.0–77.0)
Platelets: 301 10*3/uL (ref 150.0–400.0)
RBC: 4.34 Mil/uL (ref 3.87–5.11)
RDW: 14 % (ref 11.5–15.5)
WBC: 4.4 10*3/uL (ref 4.0–10.5)

## 2018-10-30 LAB — TSH: TSH: 2 u[IU]/mL (ref 0.35–4.50)

## 2018-10-30 LAB — LIPID PANEL
Cholesterol: 240 mg/dL — ABNORMAL HIGH (ref 0–200)
HDL: 51.5 mg/dL (ref >39.00)
LDL Cholesterol: 161 mg/dL — ABNORMAL HIGH (ref 0–99)
NonHDL: 188.76
Total CHOL/HDL Ratio: 5
Triglycerides: 138 mg/dL (ref 0.0–149.0)
VLDL: 27.6 mg/dL (ref 0.0–40.0)

## 2018-10-30 LAB — HEMOGLOBIN A1C: Hgb A1c MFr Bld: 5.7 % (ref 4.6–6.5)

## 2018-10-30 LAB — URIC ACID: Uric Acid, Serum: 3.8 mg/dL (ref 2.4–7.0)

## 2018-10-30 NOTE — Addendum Note (Signed)
Addended by: Caffie Pinto on: 10/30/2018 11:23 AM   Modules accepted: Orders

## 2018-11-08 ENCOUNTER — Other Ambulatory Visit: Payer: Self-pay

## 2018-11-08 ENCOUNTER — Ambulatory Visit: Payer: PPO | Admitting: Podiatry

## 2018-11-08 ENCOUNTER — Other Ambulatory Visit: Payer: Self-pay | Admitting: Podiatry

## 2018-11-08 ENCOUNTER — Encounter: Payer: Self-pay | Admitting: Podiatry

## 2018-11-08 ENCOUNTER — Ambulatory Visit (INDEPENDENT_AMBULATORY_CARE_PROVIDER_SITE_OTHER): Payer: PPO

## 2018-11-08 VITALS — Temp 98.1°F

## 2018-11-08 DIAGNOSIS — M79672 Pain in left foot: Secondary | ICD-10-CM

## 2018-11-08 DIAGNOSIS — M21622 Bunionette of left foot: Secondary | ICD-10-CM

## 2018-11-08 DIAGNOSIS — M21621 Bunionette of right foot: Secondary | ICD-10-CM

## 2018-11-08 DIAGNOSIS — M79671 Pain in right foot: Secondary | ICD-10-CM | POA: Diagnosis not present

## 2018-11-08 DIAGNOSIS — M779 Enthesopathy, unspecified: Secondary | ICD-10-CM | POA: Diagnosis not present

## 2018-11-08 DIAGNOSIS — M21619 Bunion of unspecified foot: Secondary | ICD-10-CM | POA: Diagnosis not present

## 2018-11-08 MED ORDER — TRIAMCINOLONE ACETONIDE 10 MG/ML IJ SUSP
10.0000 mg | Freq: Once | INTRAMUSCULAR | Status: AC
  Administered 2018-11-08: 10 mg

## 2018-11-08 NOTE — Progress Notes (Signed)
Subjective:   Patient ID: Victoria [name], female   DOB: 63 y.o.   MRN: [id number]   HPI Patient presents stating that she is had a lot of foot structural issues and is developed a pain that is been quite intense on the outside of her right fifth metatarsal with fluid buildup and enlargement of the joint.  Patient states is been going on for around 6 months and patient does not smoke likes to be active   Review of Systems  All other systems reviewed and are negative.       Objective:  Physical Exam Vitals signs and nursing note reviewed.  Constitutional:      Appearance: She is well-developed.  Pulmonary:     Effort: Pulmonary effort is normal.  Musculoskeletal: Normal range of motion.  Skin:    General: Skin is warm.  Neurological:     Mental Status: She is alert.     Neurovascular status found to be intact muscle strength is adequate range of motion within normal limits.  Patient is noted to have quite a bit of discomfort in the fifth metatarsal head right with inflammation fluid around the joint surface enlargement noted and also structural bunion deformity left along with tailor's bunion deformity left foot.  Patient has good digital perfusion well oriented x3     Assessment:  Acute capsulitis of the fifth MPJ left along with tailor's bunion deformity bilateral structural bunion deformity left     Plan:  H&P all conditions reviewed and discussed in today we will get a focus on the area of acute inflammation.  I did sterile prep and injected the fifth MPJ 3 mg Kenalog 5 mg Xylocaine advised on wider shoes and reappoint 2 weeks to reevaluate.  Discussed possible surgical intervention in future  X-rays indicate enlargement around the fifth metatarsal heads bilateral with structural bunion deformity and enlargement around the first MPJ left

## 2018-11-15 DIAGNOSIS — L820 Inflamed seborrheic keratosis: Secondary | ICD-10-CM | POA: Diagnosis not present

## 2018-11-22 ENCOUNTER — Ambulatory Visit: Payer: PPO | Admitting: Podiatry

## 2018-12-10 ENCOUNTER — Telehealth: Payer: Self-pay | Admitting: Internal Medicine

## 2018-12-10 NOTE — Telephone Encounter (Addendum)
[name] Order: 30mg  #1 prefilled syringe Ordered date: 12/10/2018 Expected date of arrival: 12/12/2018 Ordered by: Desmond Dike, [place]: [employer]

## 2018-12-13 NOTE — Telephone Encounter (Signed)
Fasenra Shipment Received:  30mg  #1 prefilled syringe Medication arrival date: 12/13/2018 Lot #: [id number] Exp date: 12/2019 Received by: TBS

## 2018-12-18 ENCOUNTER — Telehealth: Payer: Self-pay | Admitting: Internal Medicine

## 2018-12-18 ENCOUNTER — Ambulatory Visit: Payer: PPO

## 2018-12-18 NOTE — Telephone Encounter (Signed)
Dr. Ramaswamy please advise 

## 2018-12-18 NOTE — Telephone Encounter (Signed)
I did pt's COVID screening. Pt has a disabled son. An aide comes in every 2 wks to work with him. His wife had been running a fever for a few days, her primary Dr tested her for [place]. It was negative. The Dr is treating it as a false negative; because pt was still running a fever when the results came back. The aide came over to help her son and unknowingly exposed Mrs. Kluth. Pt said it's been 10 days since his wife found out. I told the pt I would send the note to triage, but she probably needs to be tested before she gets her shot. She has other questions too. I rescheduled appt out a wk.. (12/25/2018 at 11:30)

## 2018-12-19 ENCOUNTER — Other Ambulatory Visit: Payer: Self-pay | Admitting: Cardiology

## 2018-12-19 ENCOUNTER — Ambulatory Visit: Payer: Self-pay

## 2018-12-19 DIAGNOSIS — Z20822 Contact with and (suspected) exposure to covid-19: Secondary | ICD-10-CM

## 2018-12-19 NOTE — Telephone Encounter (Signed)
Will it be ok to send for testing Please advise

## 2018-12-19 NOTE — Telephone Encounter (Signed)
Yes OK to send for texting

## 2018-12-19 NOTE — Telephone Encounter (Signed)
Patient called and says she's been having a headache and neck pain for the past 3 weeks. She says the headache is frontal and sometimes to the left side of her head and behind her nose. She says the neck pain is to the left side and both pains are every day, moderate pain to the head and neck. She says today she woke up with blurred vision for about a minute, then her vision was back to normal. She denies fever. She says she was indirectly exposed to covid, but she had the headache before. She says her son's caregiver's wife was positive and that was 11 days ago he was around. She denies cold symptoms. I called the office and spoke to Leigh, [facility] who asked to speak to the patient, the call was connected successfully.  Answer Assessment - Initial Assessment Questions 1. LOCATION: "Where does it hurt?"      Frontal, left side of head, behind nose; left side neck pain 2. ONSET: "When did the headache start?" (Minutes, hours or days)      3 weeks ago 3. PATTERN: "Does the pain come and go, or has it been constant since it started?"     Constant to the neck 4. SEVERITY: "How bad is the pain?" and "What does it keep you from doing?"  (e.g., Scale 1-10; mild, moderate, or severe)   - MILD (1-3): doesn't interfere with normal activities    - MODERATE (4-7): interferes with normal activities or awakens from sleep    - SEVERE (8-10): excruciating pain, unable to do any normal activities        Moderate pain 5. RECURRENT SYMPTOM: "Have you ever had headaches before?" If so, ask: "When was the last time?" and "What happened that time?"      About 30 years ago 6. CAUSE: "What do you think is causing the headache?"     No 7. MIGRAINE: "Have you been diagnosed with migraine headaches?" If so, ask: "Is this headache similar?"      No 8. HEAD INJURY: "Has there been any recent injury to the head?"      No 9. OTHER SYMPTOMS: "Do you have any other symptoms?" (fever, stiff neck, eye pain, sore throat, cold  symptoms)     Blurred vision lasting about 1 minute this morning, indirect exposure to COVID (son's caregiver's wife positive covid; last contact 11 days ago) 10. PREGNANCY: "Is there any chance you are pregnant?" "When was your last menstrual period?"      No  Protocols used: HEADACHE-A-AH

## 2018-12-20 NOTE — Telephone Encounter (Signed)
Please send patient for testing per Dr. Charlett Blake

## 2018-12-20 NOTE — Telephone Encounter (Signed)
Yes please get a covid test on Victoria [name] prior to injection

## 2018-12-20 NOTE — Telephone Encounter (Signed)
Routing to [facility] for COVID testing per Dr. Chase Caller

## 2018-12-20 NOTE — Addendum Note (Signed)
Addended by: Denyce Robert on: 12/20/2018 12:01 PM   Modules accepted: Orders

## 2018-12-20 NOTE — Telephone Encounter (Signed)
Left voicemail for patient to call back to schedule COVID 19 test.  Test ordered.

## 2018-12-20 NOTE — Telephone Encounter (Signed)
Scheduled [phone] testing a the [facility] for 7/6 at 11:00am. Informed to wear mask and stay in vehicle.

## 2018-12-23 ENCOUNTER — Encounter: Payer: Self-pay | Admitting: Family Medicine

## 2018-12-24 ENCOUNTER — Other Ambulatory Visit: Payer: PPO

## 2018-12-24 DIAGNOSIS — Z20822 Contact with and (suspected) exposure to covid-19: Secondary | ICD-10-CM

## 2018-12-24 DIAGNOSIS — R6889 Other general symptoms and signs: Secondary | ICD-10-CM | POA: Diagnosis not present

## 2018-12-24 NOTE — Telephone Encounter (Signed)
Spoke with pt. Advised her that she would need to wait until her results come back before coming in for her injection. Her appointment for 12/25/2018 has been canceled. Pt will call back once she receives her results and her reschedule her injection.

## 2018-12-24 NOTE — Telephone Encounter (Signed)
Patient states was tested for Covid 19 today.  Would like to know if she needs to have her injection tomorrow.  Scheduled at 11:30 am tomorrow.  Patient phone number is [phone].

## 2018-12-25 ENCOUNTER — Ambulatory Visit: Payer: PPO

## 2018-12-28 ENCOUNTER — Encounter: Payer: Self-pay | Admitting: Family Medicine

## 2018-12-28 ENCOUNTER — Other Ambulatory Visit: Payer: Self-pay

## 2018-12-28 ENCOUNTER — Ambulatory Visit (INDEPENDENT_AMBULATORY_CARE_PROVIDER_SITE_OTHER): Payer: PPO | Admitting: Family Medicine

## 2018-12-28 DIAGNOSIS — R739 Hyperglycemia, unspecified: Secondary | ICD-10-CM | POA: Diagnosis not present

## 2018-12-28 DIAGNOSIS — E782 Mixed hyperlipidemia: Secondary | ICD-10-CM | POA: Diagnosis not present

## 2018-12-28 DIAGNOSIS — G4700 Insomnia, unspecified: Secondary | ICD-10-CM

## 2018-12-28 DIAGNOSIS — Z22322 Carrier or suspected carrier of Methicillin resistant Staphylococcus aureus: Secondary | ICD-10-CM | POA: Diagnosis not present

## 2018-12-28 MED ORDER — VENLAFAXINE HCL ER 75 MG PO CP24: ORAL_CAPSULE | ORAL | 1 refills | Status: DC

## 2018-12-28 MED ORDER — VENLAFAXINE HCL ER 37.5 MG PO CP24: ORAL_CAPSULE | ORAL | 1 refills | Status: DC

## 2018-12-28 MED ORDER — DOXYCYCLINE HYCLATE 100 MG PO TABS: 100.0000 mg | ORAL_TABLET | Freq: Two times a day (BID) | ORAL | 0 refills | Status: DC

## 2018-12-28 MED ORDER — MUPIROCIN 2 % EX OINT: 1.0000 "application " | TOPICAL_OINTMENT | Freq: Two times a day (BID) | CUTANEOUS | 1 refills | Status: DC

## 2018-12-28 MED ORDER — LORAZEPAM 0.5 MG PO TABS: 0.2500 mg | ORAL_TABLET | Freq: Every evening | ORAL | 2 refills | Status: DC | PRN

## 2018-12-28 NOTE — Assessment & Plan Note (Signed)
Encouraged good sleep hygiene such as dark, quiet room. No blue/green glowing lights such as computer screens in bedroom. No alcohol or stimulants in evening. Cut down on caffeine as able. Regular exercise is helpful but not just prior to bed time. Not responding to Melatonin 1 mg will try increasing to 2 mg and more. Has responded to Lorazepam in the past will allow her to use 0.25 to 05. Mg qhs prn and reevaluate. Risks and benefits are discussed

## 2018-12-28 NOTE — Telephone Encounter (Signed)
I spoke with pt, she still has not heard from her COVID test. The pt did however heard from the aide's wife. Her test was neg. It took 9 days for to get the results back.  Mrs. Saner has rescheduled for 01/03/2019, that will be 10 days. Hopefully she'll have her results by then. Waiting to hear from pt concerning COVID test results.

## 2018-12-28 NOTE — Assessment & Plan Note (Signed)
Encouraged heart healthy diet, increase exercise, avoid trans fats and simple carbs.  

## 2018-12-28 NOTE — Progress Notes (Signed)
Virtual Visit via Video Note  I connected with Victoria [name] on 12/28/18 at  9:20 AM EDT by a video enabled telemedicine application and verified that I am speaking with the correct person using two identifiers.  Location: Patient: home Provider: home   I discussed the limitations of evaluation and management by telemedicine and the availability of in person appointments. The patient expressed understanding and agreed to proceed. Victoria [name], CMA was able to get the patient set up on a video visit    Subjective:    Patient ID: Victoria [name], female    DOB: [birth year], 63 y.o.   MRN: [id number]  No chief complaint on file.   HPI Patient is in today for follow up on chronic medical concerns and to discuss recent abscess, congestion and more. She has a history of MRSA and this past week she had a boil on her left breast it has now burst and drained pus and she is beginning to feel better. She has also noted sinus pressure, congestion, headache, neck pain, brain fog, fatigue, chest discomfort and more. All symptoms she had when she had MRSA in the past. She has had COVID testing due to exposures and is awaiting results. Denies palp/SOBfevers/GI or GU c/o. 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 LHC 6/16:  oOM1 60, pRCA 25 >> med Rx b. cath 3/19 2nd OM with 95% stenosis s/p synergy DES & anomalous RCA   CAP (community acquired pneumonia) 2016; 06/07/2016   Chronic bronchitis (HCC)    Chronic lower back pain    Complication of anesthesia    "think I have a hard time waking up from it"   COPD (chronic obstructive pulmonary disease) ([place])    Depression    mild   Diverticulosis    GERD (gastroesophageal reflux disease)    H/O hiatal hernia    Headache     "weekly" (08/23/2017)   History of echocardiogram    Echo 6/16:  Mod LVH, EF 60-65%, no RWMA, Gr 1 DD, trivial MR, normal LA size.   Hyperglycemia 11/20/2015   Hyperlipidemia, mixed 09/11/2007   Qualifier: Diagnosis of  By: Jerold Coombe   Did not tolerate Lipitor, zocor, Lovastatin, Pravastatin, Livalo, Crestor even low dose    IBS (irritable bowel syndrome)    Maxillary sinusitis    Normal cardiac stress test 11/2011   No evidence of ischemia or infarct. Calculated ejection fraction 72%.   Obesity    OSA (obstructive sleep apnea) 02/2012   has stopped using  cpap   Osteoarthritis    Osteoporosis    Pneumonia 11/2011   "before 2013 I hadn't had pneumonia since I was a child" (04/13/2012)   Pulmonary nodules    S/P angioplasty with stent 08/23/17 ostial 2nd OM with DES synnergy 08/24/2017   Schatzki's ring     Past Surgical History:  Procedure Laterality Date   APPENDECTOMY  1989   CARDIAC CATHETERIZATION N/A 11/25/2014   Procedure: Right/Left Heart Cath and Coronary Angiography;  Surgeon: Belva Crome, MD;  Location: [facility];  Service: Cardiovascular;  Laterality: N/A;   CESAREAN SECTION  1985   CORONARY ANGIOPLASTY WITH STENT PLACEMENT  08/23/2017   CORONARY STENT INTERVENTION N/A 08/23/2017  Procedure: CORONARY STENT INTERVENTION;  Surgeon: Burnell Blanks, MD;  Location: [facility];  Service: Cardiovascular;  Laterality: N/A;   HERNIA REPAIR  04/13/2012   VHR laparoscopic   LEFT HEART CATH AND CORONARY ANGIOGRAPHY N/A 08/23/2017   Procedure: LEFT HEART CATH AND CORONARY ANGIOGRAPHY;  Surgeon: Burnell Blanks, MD;  Location: [facility];  Service: Cardiovascular;  Laterality: N/A;   VENTRAL HERNIA REPAIR  04/13/2012   Procedure: LAPAROSCOPIC VENTRAL HERNIA;  Surgeon: Adin Hector, MD;  Location: [facility];  Service: General;  Laterality: N/A;  laparoscopic repair of incarcerated hernia    Family History  Problem  Relation Age of Onset   Breast cancer Mother    Hypertension Mother    Diabetes Mother    Diverticulosis Father    Prostate cancer Father    Pulmonary embolism Brother        recurrent   Heart attack Maternal Grandfather    Breast cancer Sister    Stroke Neg Hx     Social History   Socioeconomic History   Marital status: Married    Spouse name: Not on file   Number of children: 1   Years of education: Not on file   Highest education level: Not on file  Occupational History    Employer: UNEMPLOYED  Social Needs   Financial resource strain: Not on file   Food insecurity    Worry: Not on file    Inability: Not on file   Transportation needs    Medical: Not on file    Non-medical: Not on file  Tobacco Use   Smoking status: Never Smoker   Smokeless tobacco: Never Used  Substance and Sexual Activity   Alcohol use: Yes    Alcohol/week: 4.0 standard drinks    Types: 2 Glasses of wine, 2 Cans of beer per week   Drug use: No   Sexual activity: Yes    Comment: gluten free, lives with husband and son with CP quadriplegia  Lifestyle   Physical activity    Days per week: Not on file    Minutes per session: Not on file   Stress: Not on file  Relationships   Social connections    Talks on phone: Not on file    Gets together: Not on file    Attends religious service: Not on file    Active member of club or organization: Not on file    Attends meetings of clubs or organizations: Not on file    Relationship status: Not on file   Intimate partner violence    Fear of current or ex partner: Not on file    Emotionally abused: Not on file    Physically abused: Not on file    Forced sexual activity: Not on file  Other Topics Concern   Not on file  Social History Narrative   Cares for a 62yo son with cerebral palsy.     Outpatient Medications Prior to Visit  Medication Sig Dispense Refill   acyclovir ointment (ZOVIRAX) 5 % Apply 1 application  topically every 3 (three) hours. 15 g 2   alendronate (FOSAMAX) 70 MG tablet Take 1 tablet (70 mg total) by mouth every 7 (seven) days. Take with a full glass of water on an empty stomach. 4 tablet 5   aspirin 81 MG tablet Take 81 mg by mouth daily.      Benralizumab (FASENRA) 30 MG/ML SOSY Inject 1 mL into the skin every 8 (eight)  weeks. 1 mL 11   Bioflavonoid Products (ESTER C PO) Take 500 mg by mouth daily.     Calcium-Magnesium-Vitamin D 300-150-400 MG-MG-UNIT TABS Take 1 tablet by mouth daily.     Cholecalciferol (VITAMIN D) 2000 UNITS CAPS Take 2,000 Units by mouth daily.      Evolocumab (REPATHA SURECLICK) 235 MG/ML SOAJ Inject 1 pen into the skin every 14 (fourteen) days. 6 pen 3   Magnesium Oxide 250 MG TABS Take 250 mg by mouth daily.     nitroGLYCERIN (NITROSTAT) 0.4 MG SL tablet PLACE 1 TABLET UNDER THE TONGUE EVERY 5 MINUTES AS NEEDED FOR CHEST PAIN. 25 tablet 4   Zinc 30 MG CAPS Take 30 mg by mouth daily.     venlafaxine XR (EFFEXOR-XR) 37.5 MG 24 hr capsule TAKE 1 CAPSULE BY MOUTH DAILY ALONG WITH 75 MG CAPSULE (112.5 MG TOTAL DAILY) 180 capsule 1   venlafaxine XR (EFFEXOR-XR) 75 MG 24 hr capsule TAKE 1 CAPSULE BY MOUTH ONCE DAILY ALONG WITH 37.5 MG CAPSULE DAILY. (112.5 MG TOTAL DAILY) 180 capsule 1   No facility-administered medications prior to visit.     Allergies  Allergen Reactions   Beclomethasone Dipropionate Hives and Other (See Comments)     weight gain   Budesonide-Formoterol Fumarate Hives   Mometasone Furo-Formoterol Fum Hives and Other (See Comments)    weight gain   Sulfonamide Derivatives Hives and Rash   Pulmicort [Budesonide]     Headaches    Statins     Myalgias, RLS    Review of Systems  Constitutional: Positive for malaise/fatigue. Negative for fever.  HENT: Positive for congestion.   Eyes: Negative for blurred vision.  Respiratory: Negative for shortness of breath.   Cardiovascular: Negative for chest pain, palpitations and  leg swelling.  Gastrointestinal: Negative for abdominal pain, blood in stool and nausea.  Genitourinary: Negative for dysuria and frequency.  Musculoskeletal: Positive for myalgias and neck pain. Negative for falls.  Skin: Negative for rash.  Neurological: Negative for dizziness, loss of consciousness and headaches.  Endo/Heme/Allergies: Negative for environmental allergies.  Psychiatric/Behavioral: Negative for depression. The patient is not nervous/anxious.        Objective:    Physical Exam Constitutional:      Appearance: Normal appearance. She is not ill-appearing.  HENT:     Head: Normocephalic and atraumatic.     Nose: Nose normal.  Eyes:     General:        Right eye: No discharge.        Left eye: No discharge.  Pulmonary:     Effort: Pulmonary effort is normal.  Neurological:     Mental Status: She is alert and oriented to person, place, and time.  Psychiatric:        Mood and Affect: Mood normal.        Behavior: Behavior normal.     BP 126/71 (BP Location: Left Arm, Patient Position: Sitting, Cuff Size: Normal)    Pulse 69    Wt 158 lb 9.6 oz (71.9 kg)    SpO2 99%    BMI 29.97 kg/m  Wt Readings from Last 3 Encounters:  12/28/18 158 lb 9.6 oz (71.9 kg)  10/17/18 161 lb (73 kg)  07/18/18 163 lb 12.8 oz (74.3 kg)    Diabetic Foot Exam - Simple   No data filed     Lab Results  Component Value Date   WBC 4.4 10/30/2018   HGB 13.1 10/30/2018   HCT 38.5 10/30/2018  PLT 301.0 10/30/2018   GLUCOSE 86 10/30/2018   CHOL 240 (H) 10/30/2018   TRIG 138.0 10/30/2018   HDL 51.50 10/30/2018   LDLDIRECT 177.8 09/07/2011   LDLCALC 161 (H) 10/30/2018   ALT 18 10/30/2018   AST 18 10/30/2018   NA 137 10/30/2018   K 4.4 10/30/2018   CL 101 10/30/2018   CREATININE 0.62 10/30/2018   BUN 15 10/30/2018   CO2 29 10/30/2018   TSH 2.00 10/30/2018   INR 1.0 08/21/2017   HGBA1C 5.7 10/30/2018    Lab Results  Component Value Date   TSH 2.00 10/30/2018   Lab  Results  Component Value Date   WBC 4.4 10/30/2018   HGB 13.1 10/30/2018   HCT 38.5 10/30/2018   MCV 88.8 10/30/2018   PLT 301.0 10/30/2018   Lab Results  Component Value Date   NA 137 10/30/2018   K 4.4 10/30/2018   CO2 29 10/30/2018   GLUCOSE 86 10/30/2018   BUN 15 10/30/2018   CREATININE 0.62 10/30/2018   BILITOT 0.4 10/30/2018   ALKPHOS 87 10/30/2018   AST 18 10/30/2018   ALT 18 10/30/2018   PROT 6.3 10/30/2018   ALBUMIN 4.2 10/30/2018   CALCIUM 8.9 10/30/2018   ANIONGAP 6 08/24/2017   GFR 97.70 10/30/2018   Lab Results  Component Value Date   CHOL 240 (H) 10/30/2018   Lab Results  Component Value Date   HDL 51.50 10/30/2018   Lab Results  Component Value Date   LDLCALC 161 (H) 10/30/2018   Lab Results  Component Value Date   TRIG 138.0 10/30/2018   Lab Results  Component Value Date   CHOLHDL 5 10/30/2018   Lab Results  Component Value Date   HGBA1C 5.7 10/30/2018       Assessment & Plan:   Problem List Items Addressed This Visit    Hyperglycemia (Chronic)    hgba1c acceptable, minimize simple carbs. Increase exercise as tolerated.       MRSA (methicillin resistant Staphylococcus aureus) colonization (Chronic)    Has had a recent recurrence on left breast. Abscess has bust open and drained but she has noted some associated brain fog, fatigue, malaise, neck and chest pain. She is starting to feel better but given her risk factors will treat with Doxycycline 100 mg po bid and mupirocin in nares qhs x 7 days.       Relevant Medications   mupirocin ointment (BACTROBAN) 2 %   Hyperlipidemia, mixed    Encouraged heart healthy diet, increase exercise, avoid trans fats and simple carbs.      Insomnia    Encouraged good sleep hygiene such as dark, quiet room. No blue/green glowing lights such as computer screens in bedroom. No alcohol or stimulants in evening. Cut down on caffeine as able. Regular exercise is helpful but not just prior to bed time. Not  responding to Melatonin 1 mg will try increasing to 2 mg and more. Has responded to Lorazepam in the past will allow her to use 0.25 to 05. Mg qhs prn and reevaluate. Risks and benefits are discussed         I am having Victoria [name] start on doxycycline, mupirocin ointment, and LORazepam. I am also having her maintain her Vitamin D, aspirin, Magnesium Oxide, Calcium-Magnesium-Vitamin D, Zinc, alendronate, acyclovir ointment, Benralizumab, Bioflavonoid Products (ESTER C PO), Evolocumab, nitroGLYCERIN, venlafaxine XR, and venlafaxine XR.  Meds ordered this encounter  Medications   venlafaxine XR (EFFEXOR-XR) 75 MG 24 hr capsule  Sig: TAKE 1 CAPSULE BY MOUTH ONCE DAILY ALONG WITH 37.5 MG CAPSULE DAILY. (112.5 MG TOTAL DAILY)    Dispense:  180 capsule    Refill:  1   venlafaxine XR (EFFEXOR-XR) 37.5 MG 24 hr capsule    Sig: TAKE 1 CAPSULE BY MOUTH DAILY ALONG WITH 75 MG CAPSULE (112.5 MG TOTAL DAILY)    Dispense:  180 capsule    Refill:  1   doxycycline (VIBRA-TABS) 100 MG tablet    Sig: Take 1 tablet (100 mg total) by mouth 2 (two) times daily.    Dispense:  20 tablet    Refill:  0   mupirocin ointment (BACTROBAN) 2 %    Sig: Place 1 application into the nose 2 (two) times daily.    Dispense:  22 g    Refill:  1   LORazepam (ATIVAN) 0.5 MG tablet    Sig: Take 0.5-1 tablets (0.25-0.5 mg total) by mouth at bedtime as needed for anxiety or sleep.    Dispense:  30 tablet    Refill:  2     I discussed the assessment and treatment plan with the patient. The patient was provided an opportunity to ask questions and all were answered. The patient agreed with the plan and demonstrated an understanding of the instructions.   The patient was advised to call back or seek an in-person evaluation if the symptoms worsen or if the condition fails to improve as anticipated.  I provided 25 minutes of non-face-to-face time during this encounter.   [name], MD

## 2018-12-28 NOTE — Assessment & Plan Note (Signed)
Has had a recent recurrence on left breast. Abscess has bust open and drained but she has noted some associated brain fog, fatigue, malaise, neck and chest pain. She is starting to feel better but given her risk factors will treat with Doxycycline 100 mg po bid and mupirocin in nares qhs x 7 days.

## 2018-12-28 NOTE — Assessment & Plan Note (Signed)
hgba1c acceptable, minimize simple carbs. Increase exercise as tolerated.  

## 2018-12-29 LAB — NOVEL CORONAVIRUS, NAA: SARS-CoV-2, NAA: NOT DETECTED

## 2019-01-02 ENCOUNTER — Ambulatory Visit: Payer: PPO

## 2019-01-03 ENCOUNTER — Ambulatory Visit (INDEPENDENT_AMBULATORY_CARE_PROVIDER_SITE_OTHER): Payer: PPO

## 2019-01-03 ENCOUNTER — Other Ambulatory Visit: Payer: Self-pay

## 2019-01-03 DIAGNOSIS — J4550 Severe persistent asthma, uncomplicated: Secondary | ICD-10-CM | POA: Diagnosis not present

## 2019-01-03 MED ORDER — BENRALIZUMAB 30 MG/ML ~~LOC~~ SOSY
30.0000 mg | PREFILLED_SYRINGE | Freq: Once | SUBCUTANEOUS | Status: AC
  Administered 2019-01-03: 30 mg via SUBCUTANEOUS

## 2019-01-03 NOTE — Progress Notes (Signed)
All questions were answered by the patient before medication was administered. Have you been hospitalized in the last 10 days? No Do you have a fever? No Do you have a cough? No Do you have a headache or sore throat? No  

## 2019-01-21 ENCOUNTER — Encounter: Payer: Self-pay | Admitting: Family Medicine

## 2019-01-29 DIAGNOSIS — N958 Other specified menopausal and perimenopausal disorders: Secondary | ICD-10-CM | POA: Diagnosis not present

## 2019-01-29 DIAGNOSIS — Z6829 Body mass index (BMI) 29.0-29.9, adult: Secondary | ICD-10-CM | POA: Diagnosis not present

## 2019-01-29 DIAGNOSIS — Z01419 Encounter for gynecological examination (general) (routine) without abnormal findings: Secondary | ICD-10-CM | POA: Diagnosis not present

## 2019-01-29 DIAGNOSIS — M816 Localized osteoporosis [Lequesne]: Secondary | ICD-10-CM | POA: Diagnosis not present

## 2019-01-30 ENCOUNTER — Other Ambulatory Visit: Payer: Self-pay | Admitting: Family Medicine

## 2019-01-31 ENCOUNTER — Telehealth: Payer: Self-pay | Admitting: Internal Medicine

## 2019-01-31 NOTE — Telephone Encounter (Signed)
Called [facility], [phone], spoke with Malachy Mood.  Malachy Mood stated delivery confirmation was needed for delivery. [name] order to be shipped to [facility] 02/05/19.  [name] Order: 30mg  #1 prefilled syringe Ordered date: 01/31/19 Expected date of arrival: 02/05/19 Ordered by: [facility]: [facility]

## 2019-02-05 NOTE — Telephone Encounter (Signed)
[facility] Shipment Received:  30mg  #1 prefilled syringe Medication arrival date: 02/05/19 Lot #: [id number] Exp date: 03/20/2020 Received by: Tonna Corner

## 2019-02-11 ENCOUNTER — Ambulatory Visit: Payer: PPO | Admitting: Podiatry

## 2019-02-14 ENCOUNTER — Encounter: Payer: Self-pay | Admitting: Internal Medicine

## 2019-02-14 ENCOUNTER — Ambulatory Visit: Payer: PPO | Admitting: Internal Medicine

## 2019-02-14 ENCOUNTER — Ambulatory Visit (INDEPENDENT_AMBULATORY_CARE_PROVIDER_SITE_OTHER): Payer: PPO | Admitting: Family Medicine

## 2019-02-14 ENCOUNTER — Other Ambulatory Visit: Payer: Self-pay

## 2019-02-14 VITALS — BP 122/70 | HR 72 | Temp 98.6°F | Ht 61.0 in | Wt 161.2 lb

## 2019-02-14 DIAGNOSIS — M79642 Pain in left hand: Secondary | ICD-10-CM

## 2019-02-14 DIAGNOSIS — B4481 Allergic bronchopulmonary aspergillosis: Secondary | ICD-10-CM | POA: Diagnosis not present

## 2019-02-14 DIAGNOSIS — E782 Mixed hyperlipidemia: Secondary | ICD-10-CM

## 2019-02-14 DIAGNOSIS — J82 Pulmonary eosinophilia, not elsewhere classified: Secondary | ICD-10-CM

## 2019-02-14 DIAGNOSIS — R739 Hyperglycemia, unspecified: Secondary | ICD-10-CM

## 2019-02-14 DIAGNOSIS — M810 Age-related osteoporosis without current pathological fracture: Secondary | ICD-10-CM | POA: Diagnosis not present

## 2019-02-14 DIAGNOSIS — J4550 Severe persistent asthma, uncomplicated: Secondary | ICD-10-CM | POA: Diagnosis not present

## 2019-02-14 DIAGNOSIS — K449 Diaphragmatic hernia without obstruction or gangrene: Secondary | ICD-10-CM | POA: Diagnosis not present

## 2019-02-14 DIAGNOSIS — M542 Cervicalgia: Secondary | ICD-10-CM

## 2019-02-14 DIAGNOSIS — J8283 Eosinophilic asthma: Secondary | ICD-10-CM

## 2019-02-14 NOTE — Progress Notes (Signed)
OV 10/06/2016  Chief Complaint  Patient presents with  . Follow-up    Pt states her asthma is good; had one flare up since last visit. SOB with exertion, cough with yellow to brown mucus prodcution, with frequent wheezing. Notice improvement with being on [employer]      63 year old female with severe asthma with ABPA and elevated IgE and eosinophilia. She is on IL5RAb antibody treatment now for the last few months. She feels this is helping her significantly. Her symptoms course of better. She is on Asmanex, Spiriva, Singulair as well. She is not on daily prednisone anymore. In December 2017 should she did grow MRSA from sputum culture. Currently a few days ago she developed sore throat and having some sputum production. She is worried this might be MRSA. Also asthma itself is stable and well controlled according to history. She is wondering about getting a sputum culture and really wishes for it    OV 01/11/2017  Chief Complaint  Patient presents with  . Follow-up    Pt states her breathing is doing well. Pt states the last couple days she has had more wheezing. Pt states she has noticed an improvement being on Cinqair.    63 year old female with severe asthma with ABPA and elevated IgE and eosinophilia. She is on IL5RAb antibody treatment.  She is on Asmanex, Spiriva, Singulair as well. She is not on daily prednisone anymore   01/11/2017 - Here for routine follow-up. She is on interleukin-5 receptor antibody IV infusion therapy. This was held a few weeks ago because of asthma exacerbation. She says approximately on Father's Day 2018 he started feeling unwell and then ended up with an exacerbation for which she needed antibiotics and prednisone. She is surprised by this decompensation despite being on this biologic immunotherapy for asthma. She is compliant with her Asmanex, Spiriva and Singulair. She is not on prednisone. Currently she feels baseline. Patient infusion #6 or 7 is due for  tomorrow [interleukin-5 receptor antibody). Overall she feels improved quality of life. Despite all this exhaled nitric oxide continues to be HIGH; unclear why   OV 04/21/2017  Chief Complaint  Patient presents with  . Follow-up    Pt states that she has been doing good. Has had no flare-ups with her asthma. Mild coughing at night. Denies any SOB or CP.  severe asthma with ABPA and elevated IgE and eosinophilia. She is on IL5RAb antibody treatment.  She is on Asmanex, Spiriva, Singulair as well. She is not on daily prednisone anymore  Continues cinqair spiriva, singulair, and asmanex. At this point in time the interleukin-5 receptor antibody is working really well for her. For nitric oxide today is lowest ever.. In terms of his symptoms she hardly ever gets up at night. When she wakes up she does not have any symptoms. She only has slight limitation with physical activity. She's not shortness of breath because of asthma not wheezing and not using albuterol for rescue use. The asthma control score is 0.8. However she is extremely frustrated by the [name] gravida cycle management of her interleukin-5 receptor antibody infusions. She is getting different bills wrong bills missed bills. Therefore she wants to switch to subcutaneous injectable therapy. She is very upset about the quality of care that she has received at the infusion center because of billing practices.  OV 10/19/2017  Chief Complaint  Patient presents with  . Follow-up    PFT done today. Pt states she has been  doing well. Has had a little more coughing and wheezing recently and thinks it could be due to the weather. Pt just got started back on Fasenra injections.   Severe persistent asthma with ABPA elevated IgE and eosinophilia. S/P cinqair interleukin-5 receptor antibody treatment ending" 2018 with good response but stopped secondary to billing errors at [facility] infusion center. Based on medical need then switched  to anti-eosinophil interleukin-5 receptor antibody subcutaneous injection treatment Fasenra early 2019. She is also on baseline Asmanex, Spiriva, Singulair and antihistamine 8. She is not on daily prednisone anymore.  Last seen November 2018. Overall she was doing well with good effort tolerance but in March 2019 had some atypical chest painand ended up with a coronary artery stent in the abuse marginal. She is improved from that. She is wondering about taking a beta blocker safely. She does not feel that his anemia because of blood pressure is low but apparently cardiology is asked her to clear this with me. In the interim her IV injection interleukin-5 receptor antibody has been changed to subcutaneous injection FASENRA . Because of her cardiac issues she's only taken 3 doses since January 2019. She hopes to go back on schedule with this. She had billing issues at the [facility] infusion center with the IV infusion and that is why we switched to the subcutaneous injection. However, it looks like the workflow authorization for this was not done correctly and she isn't of the large co-pay. She is unhappy about this and routinely discussed this with the patient. In terms of asthma she feels her cough is decompensated in the last few weeks and this correlates with her not taking her Asmanex. Her lung function test is near normal today. Her exhaled nitric oxide is slightly elevated than the lowest that she's had but still better than her baseline average. Currently she is waking up at nightcoughing because of the spring pollen but only hardly. Very mild symptoms when she wakes up she is slightly limited in the daytime and she experiences shortness of breath little of the time not using albuterol for rescue.    Results for Victoria [name] (MRN [id number]) as of 10/19/2017 11:51  Ref. Range 10/19/2017 10:47  FEV1-Post Latest Units: L 1.80  FEV1-%Pred-Post Latest Units: % 76  FEV1-%Change-Post Latest Units: % 0   Results for Victoria [name] (MRN [id number]) as of 10/19/2017 11:51  Ref. Range 10/19/2017 10:47  TLC Latest Units: L 4.90  TLC % pred Latest Units: % 103  Results for NYLEEN, SCHWAHN (MRN [id number]) as of 10/19/2017 11:51  Ref. Range 10/19/2017 10:47  DLCO unc Latest Units: ml/min/mmHg 19.51  DLCO unc % pred Latest Units: % 90    OV 05/14/2018  Subjective:  Patient ID: Victoria [name], female , DOB: 04-09-1956 , age 21 y.o. , MRN: [id number] , ADDRESS: [place] [id number]   05/14/2018 -   Chief Complaint  Patient presents with  . Follow-up    breathing good, SOB on exertion   Severe persistent asthma with ABPA elevated IgE and eosinophilia. S/P cinqair interleukin-5 receptor antibody treatment ending" 2018 with good response but stopped secondary to billing errors at [facility] infusion center. Based on medical need then switched to anti-eosinophil interleukin-5 receptor antibody subcutaneous injection treatment Fasenra early 2019. She is also on baseline Asmanex, Spiriva, Singulair and antihistamine 8. She is not on daily prednisone anymore.  Known hiatal hernia  HPI Victoria [name] 63 y.o. -returns for follow-up of  severe persistent asthma.  Earlier this year we switched her to interleukin-5 receptor antibody subcutaneous injection may be [employer] called Fasenra.  With this she feels her asthma symptoms have improved dramatically.  She feels it is better than seeing care.  She still thankful to the scientist of developed this biologic.  Her exam nitric oxide is the lowest ever at 27.  She is not having any nighttime symptoms or any wheezing.  In fact she stopped her inhaled steroid partly because she was feeling well but partly also because she is in the donut hole.  However she is continuing her Spiriva and Singulair.  She is up-to-date with her flu shot.    She is dealing with skin infections.  Primary care physician notes reviewed.  There is question whether this is  being precipitated by the fact she is on to Biologics.  She thinks she had MRSA but when I reviewed the chart on May 08, 2018 shows MSSA.  Therefore she is been started on Augmentin today which she is yet to start.  She also has hiatal hernia.  She has not seen GI in a few years.  It is fairly well-controlled and she plans to see GI soon.  OV 02/14/2019  Subjective:  Patient ID: Victoria [name], female , DOB: Aug 02, 1955 , age 14 y.o. , MRN: [id number] , ADDRESS: [place] [id number]   02/14/2019 -   Chief Complaint  Patient presents with  . Severe persistent asthma with intensive monitoring   Severe persistent asthma with ABPA elevated IgE and eosinophilia. S/P cinqair interleukin-5 receptor antibody treatment ending" 2018 with good response but stopped secondary to billing errors at [facility] infusion center. Based on medical need then switched to anti-eosinophil interleukin-5 receptor antibody subcutaneous injection treatment Fasenra early 2019. She is  NOT on baseline Asmanex, Spiriva, Singulair and antihistamine 8. She is not on daily prednisone anymore.  Known hiatal hernia  HPI Victoria [name] 63 y.o. -is here for follow-up of asthma with ABPA.  Since her last visit she is only on biologic Fasenra.  She has stopped Singulair, Spiriva, Asmanex.  She is not taking daily prednisone.  Asthma is well controlled.  A few weeks ago she did have a itchy feeling intake epinephrine.  She also had wheeze at this time.  This helped control the situation.  Other than that she is doing well asthma is extremely well controlled.  No nocturnal awakenings no albuterol rescue use.  We are unable to do exhaled nitric oxide testing today because of the pandemic.  She told me that she wants to remove budesonide out of her allergy list because she is only allergic to [facility] and not the nebulizer.  She is doing social distancing.  She had an appointment earlier in the year with GI because of her  hiatal hernia but because of the pandemic she did not follow through.  She is open to having this appointment and referral made again.    Results for MYREL, BOUFFARD (MRN [id number]) as of 05/14/2018 10:52  Ref. 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), Anemia, Anxiety, Asthma, CAD (coronary artery disease), CAP (community acquired pneumonia) (2016; 06/07/2016), Chronic bronchitis ([place]), Chronic lower back pain, Complication of  anesthesia, COPD (chronic obstructive pulmonary disease) ([place]), Depression, Diverticulosis, GERD (gastroesophageal reflux disease), H/O hiatal hernia, Headache, History of echocardiogram, Hyperglycemia (11/20/2015), Hyperlipidemia, mixed (09/11/2007), IBS (irritable bowel syndrome), Maxillary sinusitis, Normal cardiac stress test (11/2011), Obesity, OSA (obstructive sleep apnea) (02/2012), Osteoarthritis, Osteoporosis, Pneumonia (11/2011), Pulmonary nodules, S/P angioplasty with stent 08/23/17 ostial 2nd OM with DES synnergy (08/24/2017), and Schatzki's ring.   reports that she has never smoked. She has never used smokeless tobacco.  Past Surgical History:  Procedure Laterality Date  . APPENDECTOMY  1989  . CARDIAC CATHETERIZATION N/A 11/25/2014   Procedure: Right/Left Heart Cath and Coronary Angiography;  Surgeon: Belva Crome, MD;  Location: [place] [facility];  Service: Cardiovascular;  Laterality: N/A;  . CESAREAN SECTION  1985  . CORONARY ANGIOPLASTY WITH STENT PLACEMENT  08/23/2017  . CORONARY STENT INTERVENTION N/A 08/23/2017   Procedure: CORONARY STENT INTERVENTION;  Surgeon: Burnell Blanks, MD;  Location: [place] [facility];  Service: Cardiovascular;  Laterality: N/A;  . HERNIA REPAIR  04/13/2012   VHR laparoscopic  . LEFT HEART CATH AND  CORONARY ANGIOGRAPHY N/A 08/23/2017   Procedure: LEFT HEART CATH AND CORONARY ANGIOGRAPHY;  Surgeon: Burnell Blanks, MD;  Location: [place] [facility];  Service: Cardiovascular;  Laterality: N/A;  . VENTRAL HERNIA REPAIR  04/13/2012   Procedure: LAPAROSCOPIC VENTRAL HERNIA;  Surgeon: Adin Hector, MD;  Location: [place];  Service: General;  Laterality: N/A;  laparoscopic repair of incarcerated hernia    Allergies  Allergen Reactions  . Beclomethasone Dipropionate Hives and Other (See Comments)     weight gain  . Budesonide-Formoterol Fumarate Hives  . Mometasone Furo-Formoterol Fum Hives and Other (See Comments)    weight gain  . Sulfonamide Derivatives Hives and Rash  . Pulmicort [Budesonide]     Headaches   . Statins     Myalgias, RLS    Immunization History  Administered Date(s) Administered  . Influenza Split 04/20/2011  . Influenza Whole 06/06/2007, 04/15/2008, 04/02/2009, 03/29/2012  . Influenza, High Dose Seasonal PF 05/05/2015  . Influenza,inj,Quad PF,6+ Mos 05/09/2013, 03/03/2014, 05/03/2016, 04/21/2017, 04/02/2018  . Pneumococcal Conjugate-13 05/09/2013  . Pneumococcal Polysaccharide-23 05/04/2005  . Td 07/29/2009  . Tdap 03/11/2015    Family History  Problem Relation Age of Onset  . Breast cancer Mother   . Hypertension Mother   . Diabetes Mother   . Diverticulosis Father   . Prostate cancer Father   . Pulmonary embolism Brother        recurrent  . Heart attack Maternal Grandfather   . Breast cancer Sister   . Stroke Neg Hx      Current Outpatient Medications:  .  acyclovir ointment (ZOVIRAX) 5 %, Apply 1 application topically every 3 (three) hours., Disp: 15 g, Rfl: 2 .  alendronate (FOSAMAX) 70 MG tablet, TAKE 1 TABLET BY MOUTH EVERY 7 DAYS. TAKE WITH A FULL GLASS OF WATER ON AN EMPTY STOMACH., Disp: 4 tablet, Rfl: 5 .  aspirin 81 MG tablet, Take 81 mg by mouth daily. , Disp: , Rfl:  .  Benralizumab (FASENRA) 30 MG/ML SOSY, Inject 1 mL into  the skin every 8 (eight) weeks., Disp: 1 mL, Rfl: 11 .  Bioflavonoid Products (ESTER C PO), Take 500 mg by mouth daily., Disp: , Rfl:  .  Calcium-Magnesium-Vitamin D 300-150-400 MG-MG-UNIT TABS, Take 1 tablet by mouth daily., Disp: , Rfl:  .  Cholecalciferol (VITAMIN D) 2000 UNITS CAPS, Take 2,000 Units by mouth daily. , Disp: , Rfl:  .  Evolocumab (REPATHA SURECLICK) XX123456 MG/ML SOAJ, Inject 1 pen into the skin every 14 (fourteen) days., Disp: 6 pen, Rfl: 3 .  LORazepam (ATIVAN) 0.5 MG tablet, Take 0.5-1 tablets (0.25-0.5 mg total) by mouth at bedtime as needed for anxiety or sleep., Disp: 30 tablet, Rfl: 2 .  Magnesium Oxide 250 MG TABS, Take 250 mg by mouth daily., Disp: , Rfl:  .  mupirocin ointment (BACTROBAN) 2 %, Place 1 application into the nose 2 (two) times daily., Disp: 22 g, Rfl: 1 .  nitroGLYCERIN (NITROSTAT) 0.4 MG SL tablet, PLACE 1 TABLET UNDER THE TONGUE EVERY 5 MINUTES AS NEEDED FOR CHEST PAIN., Disp: 25 tablet, Rfl: 4 .  venlafaxine XR (EFFEXOR-XR) 37.5 MG 24 hr capsule, TAKE 1 CAPSULE BY MOUTH DAILY ALONG WITH 75 MG CAPSULE (112.5 MG TOTAL DAILY), Disp: 180 capsule, Rfl: 1 .  venlafaxine XR (EFFEXOR-XR) 75 MG 24 hr capsule, TAKE 1 CAPSULE BY MOUTH ONCE DAILY ALONG WITH 37.5 MG CAPSULE DAILY. (112.5 MG TOTAL DAILY), Disp: 180 capsule, Rfl: 1 .  Zinc 30 MG CAPS, Take 30 mg by mouth daily., Disp: , Rfl:  .  doxycycline (VIBRA-TABS) 100 MG tablet, Take 1 tablet (100 mg total) by mouth 2 (two) times daily., Disp: 20 tablet, Rfl: 0      Objective:   Vitals:   02/14/19 1133  BP: 122/70  Pulse: 72  Temp: 98.6 F (37 C)  SpO2: 96%  Weight: 161 lb 3.2 oz (73.1 kg)  Height: 5\' 1"  (1.549 m)    Estimated body mass index is 30.46 kg/m as calculated from the following:   Height as of this encounter: 5\' 1"  (1.549 m).   Weight as of this encounter: 161 lb 3.2 oz (73.1 kg).  @WEIGHTCHANGE @  [employer]   02/14/19 1133  Weight: 161 lb 3.2 oz (73.1 kg)     Physical Exam   General Appearance:    Alert, cooperative, no distress, appears stated age - yes , Deconditioned looking - no , OBESE  - no, Sitting on Wheelchair -  yes  Head:    Normocephalic, without obvious abnormality, atraumatic  Eyes:    PERRL, conjunctiva/corneas clear,  Ears:    Normal TM's and external ear canals, both ears  Nose:   Nares normal, septum midline, mucosa normal, no drainage    or sinus tenderness. OXYGEN ON  - no . Patient is @ ra   Throat:   Lips, mucosa, and tongue normal; teeth and gums normal. Cyanosis on lips - no  Neck:   Supple, symmetrical, trachea midline, no adenopathy;    thyroid:  no enlargement/tenderness/nodules; no carotid   bruit or JVD  Back:     Symmetric, no curvature, ROM normal, no CVA tenderness  Lungs:     Distress - no , Wheeze no, Barrell Chest - no, Purse lip breathing - no, Crackles - no   Chest Wall:    No tenderness or deformity.    Heart:    Regular rate and rhythm, S1 and S2 normal, no rub   or gallop, Murmur - no  Breast Exam:    NOT DONE  Abdomen:     Soft, non-tender, bowel sounds active all four quadrants,    no masses, no organomegaly. Visceral obesity - yes  Genitalia:   NOT DONE  Rectal:   NOT DONE  Extremities:   Extremities - normal, Has Cane - no, Clubbing - no, Edema - no  Pulses:   2+ and symmetric all extremities  Skin:   Stigmata of Connective Tissue Disease - yes  Lymph nodes:   Cervical, supraclavicular, and axillary nodes normal  Psychiatric:  Neurologic:   Pleasant - yes, Anxious - no, Flat affect - no  CAm-ICU - neg, Alert and Oriented x 3 - yes, Moves all 4s - yes, Speech - normal, Cognition - intact           Assessment:       ICD-10-CM   1. Severe persistent asthma with intensive monitoring  J45.50   2. 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[place])  123456   3. A B P A-ALLERGIC BRONCHOPULMONARY ASPERGILLOSIS  B44.81   4. Hiatal hernia  K44.9        Plan:     Patient Instructions     ICD-10-CM   1. Severe persistent asthma with  intensive monitoring  J45.50   2. 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[place])  123456   3. A B P A-ALLERGIC BRONCHOPULMONARY ASPERGILLOSIS  B44.81   4. Hiatal hernia  K44.9     Stable and well controlled asthma with fasenra  Plan - flu shot next month - continue fasenral scheduled + albuterol prn - monitor withuout scheduled inhaled steroids - change budesonide out of allergy list based on fact allergy is due to [facility] and not neb - refer Dr Hilarie Fredrickson of GI for hiatal hernia  Followup 6 months or sooner if needed     SIGNATURE    Dr. Brand Males, M.D., F.C.C.P,  Pulmonary and Critical Care Medicine Staff Physician, [place] Director - Interstitial Lung Disease  Program  Pulmonary [place] at [place], [place], [address]  Pager: [phone], If no answer or between  15:00h - 7:00h: call [phone] Telephone: [phone]  12:01 PM 02/14/2019

## 2019-02-14 NOTE — Patient Instructions (Signed)
ICD-10-CM   1. Severe persistent asthma with intensive monitoring  J45.50   2. 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[place])  123456   3. A B P A-ALLERGIC BRONCHOPULMONARY ASPERGILLOSIS  B44.81   4. Hiatal hernia  K44.9     Stable and well controlled asthma with fasenra  Plan - flu shot next month - continue fasenral scheduled + albuterol prn - monitor withuout scheduled inhaled steroids - change budesonide out of allergy list based on fact allergy is due to [facility] and not neb - refer Dr Hilarie Fredrickson of GI for hiatal hernia  Followup 6 months or sooner if needed

## 2019-02-17 DIAGNOSIS — M542 Cervicalgia: Secondary | ICD-10-CM | POA: Insufficient documentation

## 2019-02-17 NOTE — Assessment & Plan Note (Addendum)
That iIs causing her headaches. Referred for physical therapy and she continues with chiropractor. Encouraged increased hydration, 64 ounces of clear fluids daily. Minimize alcohol and caffeine. Eat small frequent meals with lean proteins and complex carbs. Avoid high and low blood sugars. Get adequate sleep, 7-8 hours a night. Needs exercise daily preferably in the morning.

## 2019-02-17 NOTE — Progress Notes (Signed)
Virtual Visit via Video Note  I connected with Victoria [name] on 02/14/19 at  1:40 PM EDT by a video enabled telemedicine application and verified that I am speaking with the correct person using two identifiers.  Location: Patient: home Provider: office   I discussed the limitations of evaluation and management by telemedicine and the availability of in person appointments. The patient expressed understanding and agreed to proceed. Magdalene Molly, CMA was able to get patient set up on visit, video    Subjective:    Patient ID: Victoria [name], female    DOB: 12-22-1955, 63 y.o.   MRN: [id number]  No chief complaint on file.   HPI Patient is in today for follow up on chronic medical concerns including hyperlipdiemia, hyperglycemia and more. She is struggling with increased neck pain and headaches. No photophobia or phonophobia. No trauma or falls.no radicular symptoms. Denies CP/palp/SOB/congestion/fevers/GI or GU c/o. Taking meds as prescribed.  Past Medical History:  Diagnosis Date  . Allergic bronchopulmonary aspergillosis ([place]) 2008   sees Dr Edmund Hilda pulmonology  . Anemia    iron deficiency, resolved  . Anxiety   . Asthma   . CAD (coronary artery disease)    a. LHC 6/16:  oOM1 60, pRCA 25 >> med Rx b. cath 3/19 2nd OM with 95% stenosis s/p synergy DES & anomalous RCA  . CAP (community acquired pneumonia) 2016; 06/07/2016  . Chronic bronchitis ([place])   . Chronic lower back pain   . Complication of anesthesia    "think I have a hard time waking up from it"  . COPD (chronic obstructive pulmonary disease) ([place])   . Depression    mild  . Diverticulosis   . GERD (gastroesophageal reflux disease)   . H/O hiatal hernia   . Headache    "weekly" (08/23/2017)  . History of echocardiogram    Echo 6/16:  Mod LVH, EF 60-65%, no RWMA, Gr 1 DD, trivial MR, normal LA size.  Marland Kitchen Hyperglycemia 11/20/2015  . Hyperlipidemia, mixed 09/11/2007   Qualifier: Diagnosis of  By: Jerold Coombe   Did not tolerate Lipitor, zocor, Lovastatin, Pravastatin, Livalo, Crestor even low dose   . IBS (irritable bowel syndrome)   . Maxillary sinusitis   . Normal cardiac stress test 11/2011   No evidence of ischemia or infarct. Calculated ejection fraction 72%.  . Obesity   . OSA (obstructive sleep apnea) 02/2012   has stopped using  cpap  . Osteoarthritis   . Osteoporosis   . Pneumonia 11/2011   "before 2013 I hadn't had pneumonia since I was a child" (04/13/2012)  . Pulmonary nodules   . S/P angioplasty with stent 08/23/17 ostial 2nd OM with DES synnergy 08/24/2017  . Schatzki's ring     Past Surgical History:  Procedure Laterality Date  . APPENDECTOMY  1989  . CARDIAC CATHETERIZATION N/A 11/25/2014   Procedure: Right/Left Heart Cath and Coronary Angiography;  Surgeon: Belva Crome, MD;  Location: [facility];  Service: Cardiovascular;  Laterality: N/A;  . CESAREAN SECTION  1985  . CORONARY ANGIOPLASTY WITH STENT PLACEMENT  08/23/2017  . CORONARY STENT INTERVENTION N/A 08/23/2017   Procedure: CORONARY STENT INTERVENTION;  Surgeon: Burnell Blanks, MD;  Location: [facility];  Service: Cardiovascular;  Laterality: N/A;  . HERNIA REPAIR  04/13/2012   VHR laparoscopic  . LEFT HEART CATH AND CORONARY ANGIOGRAPHY N/A 08/23/2017   Procedure: LEFT HEART CATH AND CORONARY ANGIOGRAPHY;  Surgeon: Burnell Blanks,  MD;  Location: [facility];  Service: Cardiovascular;  Laterality: N/A;  . VENTRAL HERNIA REPAIR  04/13/2012   Procedure: LAPAROSCOPIC VENTRAL HERNIA;  Surgeon: Adin Hector, MD;  Location: [facility];  Service: General;  Laterality: N/A;  laparoscopic repair of incarcerated hernia    Family History  Problem Relation Age of Onset  . Breast cancer Mother   . Hypertension Mother   . Diabetes Mother   . Diverticulosis Father   . Prostate cancer Father   . Pulmonary embolism Brother        recurrent  . Heart attack Maternal Grandfather   .  Breast cancer Sister   . Stroke Neg Hx     Social History   Socioeconomic History  . Marital status: Married    Spouse name: Not on file  . Number of children: 1  . Years of education: Not on file  . Highest education level: Not on file  Occupational History    Employer: UNEMPLOYED  Social Needs  . Financial resource strain: Not on file  . Food insecurity    Worry: Not on file    Inability: Not on file  . Transportation needs    Medical: Not on file    Non-medical: Not on file  Tobacco Use  . Smoking status: Never Smoker  . Smokeless tobacco: Never Used  Substance and Sexual Activity  . Alcohol use: Yes    Alcohol/week: 4.0 standard drinks    Types: 2 Glasses of wine, 2 Cans of beer per week  . Drug use: No  . Sexual activity: Yes    Comment: gluten free, lives with husband and son with CP quadriplegia  Lifestyle  . Physical activity    Days per week: Not on file    Minutes per session: Not on file  . Stress: Not on file  Relationships  . Social Herbalist on phone: Not on file    Gets together: Not on file    Attends religious service: Not on file    Active member of club or organization: Not on file    Attends meetings of clubs or organizations: Not on file    Relationship status: Not on file  . Intimate partner violence    Fear of current or ex partner: Not on file    Emotionally abused: Not on file    Physically abused: Not on file    Forced sexual activity: Not on file  Other Topics Concern  . Not on file  Social History Narrative   Cares for a 66yo son with cerebral palsy.     Outpatient Medications Prior to Visit  Medication Sig Dispense Refill  . acyclovir ointment (ZOVIRAX) 5 % Apply 1 application topically every 3 (three) hours. 15 g 2  . alendronate (FOSAMAX) 70 MG tablet TAKE 1 TABLET BY MOUTH EVERY 7 DAYS. TAKE WITH A FULL GLASS OF WATER ON AN EMPTY STOMACH. 4 tablet 5  . aspirin 81 MG tablet Take 81 mg by mouth daily.     .  Benralizumab (FASENRA) 30 MG/ML SOSY Inject 1 mL into the skin every 8 (eight) weeks. 1 mL 11  . Bioflavonoid Products (ESTER C PO) Take 500 mg by mouth daily.    . Calcium-Magnesium-Vitamin D 300-150-400 MG-MG-UNIT TABS Take 1 tablet by mouth daily.    . Cholecalciferol (VITAMIN D) 2000 UNITS CAPS Take 2,000 Units by mouth daily.     Marland Kitchen doxycycline (VIBRA-TABS) 100 MG tablet Take 1  tablet (100 mg total) by mouth 2 (two) times daily. 20 tablet 0  . Evolocumab (REPATHA SURECLICK) XX123456 MG/ML SOAJ Inject 1 pen into the skin every 14 (fourteen) days. 6 pen 3  . LORazepam (ATIVAN) 0.5 MG tablet Take 0.5-1 tablets (0.25-0.5 mg total) by mouth at bedtime as needed for anxiety or sleep. 30 tablet 2  . Magnesium Oxide 250 MG TABS Take 250 mg by mouth daily.    . mupirocin ointment (BACTROBAN) 2 % Place 1 application into the nose 2 (two) times daily. 22 g 1  . nitroGLYCERIN (NITROSTAT) 0.4 MG SL tablet PLACE 1 TABLET UNDER THE TONGUE EVERY 5 MINUTES AS NEEDED FOR CHEST PAIN. 25 tablet 4  . venlafaxine XR (EFFEXOR-XR) 37.5 MG 24 hr capsule TAKE 1 CAPSULE BY MOUTH DAILY ALONG WITH 75 MG CAPSULE (112.5 MG TOTAL DAILY) 180 capsule 1  . venlafaxine XR (EFFEXOR-XR) 75 MG 24 hr capsule TAKE 1 CAPSULE BY MOUTH ONCE DAILY ALONG WITH 37.5 MG CAPSULE DAILY. (112.5 MG TOTAL DAILY) 180 capsule 1  . Zinc 30 MG CAPS Take 30 mg by mouth daily.     No facility-administered medications prior to visit.     Allergies  Allergen Reactions  . Beclomethasone Dipropionate Hives and Other (See Comments)     weight gain  . Mometasone Furo-Formoterol Fum Hives and Other (See Comments)    weight gain  . Sulfonamide Derivatives Hives and Rash  . Pulmicort [Budesonide]     Headaches   . Statins     Myalgias, RLS    Review of Systems  Constitutional: Positive for malaise/fatigue. Negative for fever.  HENT: Negative for congestion.   Eyes: Negative for blurred vision.  Respiratory: Negative for shortness of breath.    Cardiovascular: Negative for chest pain, palpitations and leg swelling.  Gastrointestinal: Negative for abdominal pain, blood in stool and nausea.  Genitourinary: Negative for dysuria and frequency.  Musculoskeletal: Positive for neck pain. Negative for falls.  Skin: Negative for rash.  Neurological: Positive for headaches. Negative for dizziness and loss of consciousness.  Endo/Heme/Allergies: Negative for environmental allergies.  Psychiatric/Behavioral: Negative for depression. The patient is not nervous/anxious.        Objective:    Physical Exam  There were no vitals taken for this visit. Wt Readings from Last 3 Encounters:  02/14/19 161 lb 3.2 oz (73.1 kg)  12/28/18 158 lb 9.6 oz (71.9 kg)  10/17/18 161 lb (73 kg)    Diabetic Foot Exam - Simple   No data filed     Lab Results  Component Value Date   WBC 4.4 10/30/2018   HGB 13.1 10/30/2018   HCT 38.5 10/30/2018   PLT 301.0 10/30/2018   GLUCOSE 86 10/30/2018   CHOL 240 (H) 10/30/2018   TRIG 138.0 10/30/2018   HDL 51.50 10/30/2018   LDLDIRECT 177.8 09/07/2011   LDLCALC 161 (H) 10/30/2018   ALT 18 10/30/2018   AST 18 10/30/2018   NA 137 10/30/2018   K 4.4 10/30/2018   CL 101 10/30/2018   CREATININE 0.62 10/30/2018   BUN 15 10/30/2018   CO2 29 10/30/2018   TSH 2.00 10/30/2018   INR 1.0 08/21/2017   HGBA1C 5.7 10/30/2018    Lab Results  Component Value Date   TSH 2.00 10/30/2018   Lab Results  Component Value Date   WBC 4.4 10/30/2018   HGB 13.1 10/30/2018   HCT 38.5 10/30/2018   MCV 88.8 10/30/2018   PLT 301.0 10/30/2018   Lab Results  Component Value Date   NA 137 10/30/2018   K 4.4 10/30/2018   CO2 29 10/30/2018   GLUCOSE 86 10/30/2018   BUN 15 10/30/2018   CREATININE 0.62 10/30/2018   BILITOT 0.4 10/30/2018   ALKPHOS 87 10/30/2018   AST 18 10/30/2018   ALT 18 10/30/2018   PROT 6.3 10/30/2018   ALBUMIN 4.2 10/30/2018   CALCIUM 8.9 10/30/2018   ANIONGAP 6 08/24/2017   GFR 97.70  10/30/2018   Lab Results  Component Value Date   CHOL 240 (H) 10/30/2018   Lab Results  Component Value Date   HDL 51.50 10/30/2018   Lab Results  Component Value Date   LDLCALC 161 (H) 10/30/2018   Lab Results  Component Value Date   TRIG 138.0 10/30/2018   Lab Results  Component Value Date   CHOLHDL 5 10/30/2018   Lab Results  Component Value Date   HGBA1C 5.7 10/30/2018       Assessment & Plan:   Problem List Items Addressed This Visit    Hyperglycemia (Chronic)    hgba1c acceptable, minimize simple carbs. Increase exercise as tolerated.       Relevant Orders   Hemoglobin A1c   CBC   Comprehensive metabolic panel   TSH   Hyperlipidemia, mixed    Encouraged heart healthy diet, increase exercise, avoid trans fats, consider a krill oil cap daily      Relevant Orders   CBC   Comprehensive metabolic panel   Lipid panel   Osteoporosis   Relevant Orders   CBC   Comprehensive metabolic panel   TSH   VITAMIN D 25 Hydroxy (Vit-D Deficiency, Fractures)   Neck pain - Primary    That iIs causing her headaches. Referred for physical therapy and she continues with chiropractor. Encouraged increased hydration, 64 ounces of clear fluids daily. Minimize alcohol and caffeine. Eat small frequent meals with lean proteins and complex carbs. Avoid high and low blood sugars. Get adequate sleep, 7-8 hours a night. Needs exercise daily preferably in the morning.      Relevant Orders   Ambulatory referral to Physical Therapy   DG Cervical Spine Complete    Other Visit Diagnoses    Pain of left hand       Relevant Orders   DG Hand Complete Left      I am having Victoria [name] maintain her Vitamin D, aspirin, Magnesium Oxide, Calcium-Magnesium-Vitamin D, Zinc, acyclovir ointment, Benralizumab, Bioflavonoid Products (ESTER C PO), Evolocumab, nitroGLYCERIN, venlafaxine XR, venlafaxine XR, doxycycline, mupirocin ointment, LORazepam, and alendronate.  No orders of the defined  types were placed in this encounter.   I discussed the assessment and treatment plan with the patient. The patient was provided an opportunity to ask questions and all were answered. The patient agreed with the plan and demonstrated an understanding of the instructions.   The patient was advised to call back or seek an in-person evaluation if the symptoms worsen or if the condition fails to improve as anticipated.  I provided 25 minutes of non-face-to-face time during this encounter.   [name], MD

## 2019-02-17 NOTE — Assessment & Plan Note (Signed)
hgba1c acceptable, minimize simple carbs. Increase exercise as tolerated.  

## 2019-02-17 NOTE — Assessment & Plan Note (Signed)
Encouraged heart healthy diet, increase exercise, avoid trans fats, consider a krill oil cap daily 

## 2019-02-21 ENCOUNTER — Encounter: Payer: Self-pay | Admitting: Podiatry

## 2019-02-21 ENCOUNTER — Other Ambulatory Visit: Payer: Self-pay

## 2019-02-21 ENCOUNTER — Ambulatory Visit: Payer: PPO | Admitting: Podiatry

## 2019-02-21 DIAGNOSIS — I519 Heart disease, unspecified: Secondary | ICD-10-CM | POA: Insufficient documentation

## 2019-02-21 DIAGNOSIS — M21619 Bunion of unspecified foot: Secondary | ICD-10-CM

## 2019-02-21 DIAGNOSIS — M7751 Other enthesopathy of right foot: Secondary | ICD-10-CM | POA: Diagnosis not present

## 2019-02-21 DIAGNOSIS — M779 Enthesopathy, unspecified: Secondary | ICD-10-CM

## 2019-02-22 NOTE — Progress Notes (Signed)
Subjective:   Patient ID: Victoria [name], female   DOB: 63 y.o.   MRN: [id number]   HPI Patient states she started to develop pain on the top of her foot and it lasted about 3 to 4 months with symptoms recurring.  States that she is not sure if she may have done something to brought it on and she is also concerned about bunion deformity of that bone and also on her other foot   ROS      Objective:  Physical Exam  Neurovascular status intact no change in health history with patient found to have extreme inflammation around the fifth MPJ right with prominent metatarsal bone and also moderate structural bunion deformity left and tailor's bunion deformity left     Assessment:  Continued inflammatory capsulitis right fifth MPJ that was improved for approximately 3 months with patient noted to have structural bunion deformity bilateral     Plan:  H&P discussed both conditions.  We are going to treat the acute condition again but I did discuss probability at one point for fifth metatarsal head resection and also I went ahead today discussed bunion deformity and other structural issues.  Today I did sterile prep and injected around the fifth MPJ right 3 mg Kenalog 5 mg Xylocaine and advised on wider shoe gear.  Patient will be seen back to recheck and again consideration long-term will be for structural correction

## 2019-02-28 ENCOUNTER — Ambulatory Visit (INDEPENDENT_AMBULATORY_CARE_PROVIDER_SITE_OTHER): Payer: PPO

## 2019-02-28 ENCOUNTER — Other Ambulatory Visit: Payer: Self-pay

## 2019-02-28 DIAGNOSIS — J4550 Severe persistent asthma, uncomplicated: Secondary | ICD-10-CM | POA: Diagnosis not present

## 2019-02-28 MED ORDER — BENRALIZUMAB 30 MG/ML ~~LOC~~ SOSY
30.0000 mg | PREFILLED_SYRINGE | Freq: Once | SUBCUTANEOUS | Status: AC
  Administered 2019-02-28: 11:00:00 30 mg via SUBCUTANEOUS

## 2019-02-28 NOTE — Progress Notes (Signed)
All questions were answered by the patient before medication was administered. Have you been hospitalized in the last 10 days? No Do you have a fever? No Do you have a cough? No Do you have a headache or sore throat? No  

## 2019-03-04 ENCOUNTER — Encounter: Payer: Self-pay | Admitting: Family Medicine

## 2019-03-06 ENCOUNTER — Ambulatory Visit (HOSPITAL_BASED_OUTPATIENT_CLINIC_OR_DEPARTMENT_OTHER)
Admission: RE | Admit: 2019-03-06 | Discharge: 2019-03-06 | Disposition: A | Discharge status: Home / Self Care | Payer: PPO | Source: Ambulatory Visit | Attending: Family Medicine | Admitting: Family Medicine

## 2019-03-06 ENCOUNTER — Other Ambulatory Visit: Payer: Self-pay

## 2019-03-06 DIAGNOSIS — M79642 Pain in left hand: Secondary | ICD-10-CM | POA: Insufficient documentation

## 2019-03-06 DIAGNOSIS — M19042 Primary osteoarthritis, left hand: Secondary | ICD-10-CM | POA: Diagnosis not present

## 2019-03-06 DIAGNOSIS — M542 Cervicalgia: Secondary | ICD-10-CM | POA: Insufficient documentation

## 2019-03-07 ENCOUNTER — Telehealth: Payer: Self-pay | Admitting: Cardiology

## 2019-03-07 NOTE — Telephone Encounter (Signed)
Pt reports several episodes of CP this week. She is not currently experiencing CP. She has virtual appt with Meda Coffee tomorrow. On Monday CP occurred and she took 325 mg Aspirin, drank a lot of water and CP went away within 5 minutes. Yesterday she experienced CP again with some tingling in her face but it subsided within 5 minutes with no intervention. Her BP at this time was 139/87. She had another episode of CP today with no SOB, N/V, dizziness or diaphroesis. This episode was non-exertional, lasted about a few minutes and did not require intervention, her BP was 140/80. Pt does have Nitroglycerin but has not felt like she needed to take it. She reports her MIL passing away last week and dealing with family in town/arrangements etc. 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 Pt does not feel that she has been more stressed than usual but does report that she has been going through a lot. She has increased her Effexor dose and has been taking her PRN Lorazepam more often d/t trouble sleeping. No other medication changes.

## 2019-03-07 NOTE — Telephone Encounter (Signed)
New Message  Pt c/o of Chest Pain: STAT if CP now or developed within 24 hours  1. Are you having CP right now? No  2. Are you experiencing any other symptoms (ex. SOB, nausea, vomiting, sweating)? Headaches, neck pain, increased blood pressure (140/80), chest pain coming and going (twice this week),  3. How long have you been experiencing CP? This week, neck pain started in June, Headaches started July  4. Is your CP continuous or coming and going? Coming and Going  5. Have you taken Nitroglycerin? No    Scheduled virtual visit with Dr. Meda Coffee on 03/08/19 8:00 am.   ?

## 2019-03-08 ENCOUNTER — Encounter: Payer: Self-pay | Admitting: *Deleted

## 2019-03-08 ENCOUNTER — Other Ambulatory Visit: Payer: Self-pay

## 2019-03-08 ENCOUNTER — Telehealth (INDEPENDENT_AMBULATORY_CARE_PROVIDER_SITE_OTHER): Payer: PPO | Admitting: Cardiology

## 2019-03-08 ENCOUNTER — Encounter: Payer: Self-pay | Admitting: Cardiology

## 2019-03-08 VITALS — BP 129/78 | HR 83 | Wt 159.4 lb

## 2019-03-08 DIAGNOSIS — E782 Mixed hyperlipidemia: Secondary | ICD-10-CM

## 2019-03-08 DIAGNOSIS — I5032 Chronic diastolic (congestive) heart failure: Secondary | ICD-10-CM | POA: Diagnosis not present

## 2019-03-08 DIAGNOSIS — I25110 Atherosclerotic heart disease of native coronary artery with unstable angina pectoris: Secondary | ICD-10-CM | POA: Diagnosis not present

## 2019-03-08 DIAGNOSIS — M542 Cervicalgia: Secondary | ICD-10-CM | POA: Diagnosis not present

## 2019-03-08 DIAGNOSIS — R030 Elevated blood-pressure reading, without diagnosis of hypertension: Secondary | ICD-10-CM | POA: Diagnosis not present

## 2019-03-08 DIAGNOSIS — R519 Headache, unspecified: Secondary | ICD-10-CM

## 2019-03-08 DIAGNOSIS — R072 Precordial pain: Secondary | ICD-10-CM

## 2019-03-08 NOTE — Progress Notes (Signed)
03/08/2019 Victoria [name]   07/13/1955  [id number]  Primary Physician Mosie Lukes, MD Primary Cardiologist: Dr. Meda Coffee   Reason for Visit/CC: Add on for CP  HPI:  Victoria [name] is a 63 y.o. female who is being seen today for CP. She is followed by Dr. Meda Coffee has has a h/o CAD. She underwent cardiac catheterization for CP in March of this year which showed single vessel obstructive CAD with severe stenosis in the ostium of the large obtuse marginal branch. This was treated with successful PTCA/DES x1. LVEF was normal. She also has a h/o GERD and hiatal hernia. She was previously on omeprazole, which was controlling her reflux, but this was discontinued in March after she got her stent and was placed on DAPT w/ ASA and Plavix. She was switched to Protonix but did not tolerate this due to side effects. She has since been using a OTC H2 blocker, Zantac, even despite the recall. She also has HLD which is controlled with Repatha. Recent lipid panel showed LDL at 69 mg/dL.  She presents to clinic with new complaints of chest pain which started last week.  The chest pain is likely different from the angina she experienced prior to undergoing stent placement in March.  Her pain at that time was more of a chest tightness/squeezing sensation.  Her new chest pain has felt more like burning /indigestion but no relationship with meals. Does not radiate. It has occurred several times off and on and last a couple minutes at a time.  Some occasional relief with belching. No associated dyspnea. Does not seem to be worsened by exertion.  She reports full medication compliance with aspirin and Plavix.  She tried taking sublingual nitroglycerin yesterday without relief.  It did cause a headache.   05/24/2018 -disease 1 month follow-up, she was here at the end of October for complaints of chest pain, since then she switched her PPI medicines and completed antibiotics for lower extremity cellulitis and her pain has  completely resolved.  She walks daily and has no symptoms of chest pain or shortness of breath.  She has been compliant with her meds.  03/08/2019 - she has been experiencing chest pains on exertion and at rest, in the last week at least 3 episodes, NTG and drinking cold water helps. She has been experiencing neck pains since June, underwent a X ray yesterday. The neck pains have been associated with chest pains sometimes.    No outpatient medications have been marked as taking for the 03/08/19 encounter (Telemedicine) with Dorothy Spark, MD.   Allergies  Allergen Reactions  . Beclomethasone Dipropionate Hives and Other (See Comments)     weight gain  . Mometasone Furo-Formoterol Fum Hives and Other (See Comments)    weight gain  . Sulfonamide Derivatives Hives and Rash  . Statins     Myalgias, RLS   Past Medical History:  Diagnosis Date  . 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 Anemia    iron deficiency, resolved  . Anxiety   . Asthma   . CAD (coronary artery disease)    a. LHC 6/16:  oOM1 60, pRCA 25 >> med Rx b. cath 3/19 2nd OM with 95% stenosis s/p synergy DES & anomalous RCA  . CAP (community acquired pneumonia) 2016; 06/07/2016  . Chronic bronchitis ([place])   . Chronic lower back pain   . Complication of anesthesia    "think I have a  hard time waking up from it"  . COPD (chronic obstructive pulmonary disease) ([place])   . Depression    mild  . Diverticulosis   . GERD (gastroesophageal reflux disease)   . H/O hiatal hernia   . Headache    "weekly" (08/23/2017)  . History of echocardiogram    Echo 6/16:  Mod LVH, EF 60-65%, no RWMA, Gr 1 DD, trivial MR, normal LA size.  Marland Kitchen Hyperglycemia 11/20/2015  . Hyperlipidemia, mixed 09/11/2007   Qualifier: Diagnosis of  By: Jerold Coombe   Did not tolerate Lipitor, zocor, Lovastatin, Pravastatin, Livalo, Crestor even low dose   . IBS (irritable bowel syndrome)   . Maxillary  sinusitis   . Normal cardiac stress test 11/2011   No evidence of ischemia or infarct. Calculated ejection fraction 72%.  . Obesity   . OSA (obstructive sleep apnea) 02/2012   has stopped using  cpap  . Osteoarthritis   . Osteoporosis   . Pneumonia 11/2011   "before 2013 I hadn't had pneumonia since I was a child" (04/13/2012)  . Pulmonary nodules   . S/P angioplasty with stent 08/23/17 ostial 2nd OM with DES synnergy 08/24/2017  . Schatzki's ring    Family History  Problem Relation Age of Onset  . Breast cancer Mother   . Hypertension Mother   . Diabetes Mother   . Diverticulosis Father   . Prostate cancer Father   . Pulmonary embolism Brother        recurrent  . Heart attack Maternal Grandfather   . Breast cancer Sister   . Stroke Neg Hx    Past Surgical History:  Procedure Laterality Date  . APPENDECTOMY  1989  . CARDIAC CATHETERIZATION N/A 11/25/2014   Procedure: Right/Left Heart Cath and Coronary Angiography;  Surgeon: Belva Crome, MD;  Location: [facility];  Service: Cardiovascular;  Laterality: N/A;  . CESAREAN SECTION  1985  . CORONARY ANGIOPLASTY WITH STENT PLACEMENT  08/23/2017  . CORONARY STENT INTERVENTION N/A 08/23/2017   Procedure: CORONARY STENT INTERVENTION;  Surgeon: Burnell Blanks, MD;  Location: [facility];  Service: Cardiovascular;  Laterality: N/A;  . HERNIA REPAIR  04/13/2012   VHR laparoscopic  . LEFT HEART CATH AND CORONARY ANGIOGRAPHY N/A 08/23/2017   Procedure: LEFT HEART CATH AND CORONARY ANGIOGRAPHY;  Surgeon: Burnell Blanks, MD;  Location: [facility];  Service: Cardiovascular;  Laterality: N/A;  . VENTRAL HERNIA REPAIR  04/13/2012   Procedure: LAPAROSCOPIC VENTRAL HERNIA;  Surgeon: Adin Hector, MD;  Location: [facility];  Service: General;  Laterality: N/A;  laparoscopic repair of incarcerated hernia   Social History   Socioeconomic History  . Marital status: Married    Spouse name: Not on file  . Number of  children: 1  . Years of education: Not on file  . Highest education level: Not on file  Occupational History    Employer: UNEMPLOYED  Social Needs  . Financial resource strain: Not on file  . Food insecurity    Worry: Not on file    Inability: Not on file  . Transportation needs    Medical: Not on file    Non-medical: Not on file  Tobacco Use  . Smoking status: Never Smoker  . Smokeless tobacco: Never Used  Substance and Sexual Activity  . Alcohol use: Yes    Alcohol/week: 4.0 standard drinks    Types: 2 Glasses of wine, 2 Cans of beer per week  . Drug use: No  .  Sexual activity: Yes    Comment: gluten free, lives with husband and son with CP quadriplegia  Lifestyle  . Physical activity    Days per week: Not on file    Minutes per session: Not on file  . Stress: Not on file  Relationships  . Social Herbalist on phone: Not on file    Gets together: Not on file    Attends religious service: Not on file    Active member of club or organization: Not on file    Attends meetings of clubs or organizations: Not on file    Relationship status: Not on file  . Intimate partner violence    Fear of current or ex partner: Not on file    Emotionally abused: Not on file    Physically abused: Not on file    Forced sexual activity: Not on file  Other Topics Concern  . Not on file  Social History Narrative   Cares for a 35yo son with cerebral palsy.      Review of Systems: General: negative for chills, fever, night sweats or weight changes.  Cardiovascular: negative for chest pain, dyspnea on exertion, edema, orthopnea, palpitations, paroxysmal nocturnal dyspnea or shortness of breath Dermatological: negative for rash Respiratory: negative for cough or wheezing Urologic: negative for hematuria Abdominal: negative for nausea, vomiting, diarrhea, bright red blood per rectum, melena, or hematemesis Neurologic: negative for visual changes, syncope, or dizziness All other  systems reviewed and are otherwise negative except as noted above.  Physical Exam:  Blood pressure 129/78, pulse 83, weight 159 lb 6.4 oz (72.3 kg).  General appearance: alert, cooperative and no distress Neck: no carotid bruit and no JVD Lungs: clear to auscultation bilaterally Heart: regular rate and rhythm, S1, S2 normal, no murmur, click, rub or gallop Extremities: extremities normal, atraumatic, no cyanosis or edema Pulses: 2+ and symmetric Skin: Skin color, texture, turgor normal. No rashes or lesions Neurologic: Grossly normal  EKG NSR 69 bpm, no ischemic abnormalities -- personally reviewed    ASSESSMENT AND PLAN:   1. CAD/ Chest Pain: status post cardiac catheterization March 2019 showing severe single-vessel CAD with severe stenosis in the ostium of the large obtuse marginal branch.  This was successfully treated with PTCA/DES x1 to the ostium of the OM.  Plavix was discontinues in March. With new chest pains I will order a treadmill nuclear stress test.   2. Neck pain- we will obtain carotid US B/L, if normal she is encourages to continue work up for possible cervical radiculopathy - cervical MRI.  3.  Hyperlipidemia -restarted on Repatha and all lipids are at goal.  Follow up in 2 months with a PA.  Ena Dawley , MD [facility] 03/08/2019 8:45 AM

## 2019-03-08 NOTE — Patient Instructions (Addendum)
Medication Instructions:   Your physician recommends that you continue on your current medications as directed. Please refer to the Current Medication list given to you today.  If you need a refill on your cardiac medications before your next appointment, please call your pharmacy.   .  Testing/Procedures:  Your physician has requested that you have en exercise stress myoview. For further information please visit [email or web address]. Please follow instruction sheet, as given.   Your physician has requested that you have a carotid duplex. This test is an ultrasound of the carotid arteries in your neck. It looks at blood flow through these arteries that supply the brain with blood. Allow one hour for this exam. There are no restrictions or special instructions.    Follow-Up:  WITH Wallis Bamberg NP ON May 08, 2019 AT 9:30 AM IN THE OFFICE

## 2019-03-08 NOTE — Addendum Note (Signed)
Addended by: [name] on: 03/08/2019 09:07 AM   Modules accepted: Orders

## 2019-03-11 ENCOUNTER — Other Ambulatory Visit (INDEPENDENT_AMBULATORY_CARE_PROVIDER_SITE_OTHER): Payer: PPO

## 2019-03-11 ENCOUNTER — Other Ambulatory Visit: Payer: Self-pay

## 2019-03-11 DIAGNOSIS — M810 Age-related osteoporosis without current pathological fracture: Secondary | ICD-10-CM

## 2019-03-11 DIAGNOSIS — E782 Mixed hyperlipidemia: Secondary | ICD-10-CM | POA: Diagnosis not present

## 2019-03-11 DIAGNOSIS — R739 Hyperglycemia, unspecified: Secondary | ICD-10-CM | POA: Diagnosis not present

## 2019-03-11 LAB — LIPID PANEL
Cholesterol: 141 mg/dL (ref 0–200)
HDL: 56.1 mg/dL (ref >39.00)
LDL Cholesterol: 66 mg/dL (ref 0–99)
NonHDL: 84.64
Total CHOL/HDL Ratio: 3
Triglycerides: 93 mg/dL (ref 0.0–149.0)
VLDL: 18.6 mg/dL (ref 0.0–40.0)

## 2019-03-11 LAB — CBC
HCT: 41.3 % (ref 36.0–46.0)
Hemoglobin: 13.6 g/dL (ref 12.0–15.0)
MCHC: 33 g/dL (ref 30.0–36.0)
MCV: 91.9 fl (ref 78.0–100.0)
Platelets: 297 10*3/uL (ref 150.0–400.0)
RBC: 4.49 Mil/uL (ref 3.87–5.11)
RDW: 13.4 % (ref 11.5–15.5)
WBC: 5.1 10*3/uL (ref 4.0–10.5)

## 2019-03-11 LAB — TSH: TSH: 2.24 u[IU]/mL (ref 0.35–4.50)

## 2019-03-11 LAB — COMPREHENSIVE METABOLIC PANEL
ALT: 15 U/L (ref 0–35)
AST: 17 U/L (ref 0–37)
Albumin: 4.1 g/dL (ref 3.5–5.2)
Alkaline Phosphatase: 81 U/L (ref 39–117)
BUN: 11 mg/dL (ref 6–23)
CO2: 29 mEq/L (ref 19–32)
Calcium: 9.1 mg/dL (ref 8.4–10.5)
Chloride: 102 mEq/L (ref 96–112)
Creatinine, Ser: 0.55 mg/dL (ref 0.40–1.20)
GFR: 112.05 mL/min (ref >60.00)
Glucose, Bld: 82 mg/dL (ref 70–99)
Potassium: 4.2 mEq/L (ref 3.5–5.1)
Sodium: 138 mEq/L (ref 135–145)
Total Bilirubin: 0.4 mg/dL (ref 0.2–1.2)
Total Protein: 6.3 g/dL (ref 6.0–8.3)

## 2019-03-11 LAB — HEMOGLOBIN A1C: Hgb A1c MFr Bld: 5.7 % (ref 4.6–6.5)

## 2019-03-11 LAB — VITAMIN D 25 HYDROXY (VIT D DEFICIENCY, FRACTURES): VITD: 49.97 ng/mL (ref 30.00–100.00)

## 2019-03-12 ENCOUNTER — Telehealth (HOSPITAL_COMMUNITY): Payer: Self-pay | Admitting: *Deleted

## 2019-03-12 ENCOUNTER — Telehealth: Payer: Self-pay | Admitting: Cardiology

## 2019-03-12 ENCOUNTER — Other Ambulatory Visit (HOSPITAL_COMMUNITY)
Admission: RE | Admit: 2019-03-12 | Discharge: 2019-03-12 | Disposition: A | Discharge status: Home / Self Care | Payer: PPO | Source: Ambulatory Visit | Attending: Cardiology | Admitting: Cardiology

## 2019-03-12 ENCOUNTER — Other Ambulatory Visit: Payer: Self-pay | Admitting: Cardiology

## 2019-03-12 ENCOUNTER — Ambulatory Visit (HOSPITAL_COMMUNITY)
Admission: RE | Admit: 2019-03-12 | Discharge: 2019-03-12 | Disposition: A | Discharge status: Home / Self Care | Payer: PPO | Source: Ambulatory Visit | Attending: Cardiovascular Disease | Admitting: Cardiovascular Disease

## 2019-03-12 ENCOUNTER — Encounter (HOSPITAL_COMMUNITY): Payer: Self-pay | Admitting: *Deleted

## 2019-03-12 DIAGNOSIS — Z20828 Contact with and (suspected) exposure to other viral communicable diseases: Secondary | ICD-10-CM | POA: Insufficient documentation

## 2019-03-12 DIAGNOSIS — I6529 Occlusion and stenosis of unspecified carotid artery: Secondary | ICD-10-CM

## 2019-03-12 DIAGNOSIS — M542 Cervicalgia: Secondary | ICD-10-CM

## 2019-03-12 DIAGNOSIS — Z01818 Encounter for other preprocedural examination: Secondary | ICD-10-CM | POA: Insufficient documentation

## 2019-03-12 DIAGNOSIS — E785 Hyperlipidemia, unspecified: Secondary | ICD-10-CM | POA: Diagnosis not present

## 2019-03-12 DIAGNOSIS — I25110 Atherosclerotic heart disease of native coronary artery with unstable angina pectoris: Secondary | ICD-10-CM | POA: Insufficient documentation

## 2019-03-12 DIAGNOSIS — H9312 Tinnitus, left ear: Secondary | ICD-10-CM | POA: Diagnosis not present

## 2019-03-12 NOTE — Telephone Encounter (Signed)
Left message on voicemail per DPR in reference to upcoming appointment scheduled on 03/15/2019 at [facility] with detailed instructions given per Myocardial Perfusion Study Information Sheet for the test. LM to arrive 15 minutes early, and that it is imperative to arrive on time for appointment to keep from having the test rescheduled. If you need to cancel or reschedule your appointment, please call the office within 24 hours of your appointment. Failure to do so may result in a cancellation of your appointment, and a $50 no show fee. Phone number given for call back for any questions.  My chart letter sent with instructions. Brenley Priore, [facility]

## 2019-03-12 NOTE — Telephone Encounter (Signed)
Pt has been notified of carotid results by phone with verbal understanding. Pt thanked me for the good news normal carotid results. Pt has myoview 03/15/19. The patient has been notified of the result and verbalized understanding.  All questions (if any) were answered. Julaine Hua, [facility] 03/12/2019 3:53 PM

## 2019-03-12 NOTE — Telephone Encounter (Signed)
New Message  Patient is calling back in for lab results. Please give patient a call back.

## 2019-03-13 ENCOUNTER — Telehealth: Payer: Self-pay

## 2019-03-13 LAB — NOVEL CORONAVIRUS, NAA (HOSP ORDER, SEND-OUT TO REF LAB; TAT 18-24 HRS): SARS-CoV-2, NAA: NOT DETECTED

## 2019-03-13 NOTE — Telephone Encounter (Signed)
The patient has been notified of the result and verbalized understanding.  All questions (if any) were answered. Wilma Flavin, RN 03/13/2019 9:45 AM

## 2019-03-15 ENCOUNTER — Other Ambulatory Visit: Payer: Self-pay

## 2019-03-15 ENCOUNTER — Ambulatory Visit (HOSPITAL_COMMUNITY): Payer: PPO | Attending: Cardiovascular Disease

## 2019-03-15 DIAGNOSIS — R072 Precordial pain: Secondary | ICD-10-CM | POA: Insufficient documentation

## 2019-03-15 DIAGNOSIS — E782 Mixed hyperlipidemia: Secondary | ICD-10-CM | POA: Insufficient documentation

## 2019-03-15 DIAGNOSIS — I5032 Chronic diastolic (congestive) heart failure: Secondary | ICD-10-CM | POA: Diagnosis not present

## 2019-03-15 DIAGNOSIS — R030 Elevated blood-pressure reading, without diagnosis of hypertension: Secondary | ICD-10-CM | POA: Diagnosis not present

## 2019-03-15 DIAGNOSIS — I25110 Atherosclerotic heart disease of native coronary artery with unstable angina pectoris: Secondary | ICD-10-CM | POA: Insufficient documentation

## 2019-03-15 DIAGNOSIS — M542 Cervicalgia: Secondary | ICD-10-CM | POA: Insufficient documentation

## 2019-03-15 LAB — MYOCARDIAL PERFUSION IMAGING
Estimated workload: 10.1 METS
Exercise duration (min): 8 min
Exercise duration (sec): 15 s
LV dias vol: 55 mL (ref 46–106)
LV sys vol: 14 mL
MPHR: 159 {beats}/min
Peak HR: 137 {beats}/min
Percent HR: 86 %
Rest HR: 71 {beats}/min
SDS: 1
SRS: 0
SSS: 1
TID: 0.84

## 2019-03-15 MED ORDER — TECHNETIUM TC 99M TETROFOSMIN IV KIT
10.7000 | PACK | Freq: Once | INTRAVENOUS | Status: AC | PRN
  Administered 2019-03-15: 10.7 via INTRAVENOUS
  Filled 2019-03-15: qty 11

## 2019-03-15 MED ORDER — TECHNETIUM TC 99M TETROFOSMIN IV KIT
31.4000 | PACK | Freq: Once | INTRAVENOUS | Status: AC | PRN
  Administered 2019-03-15: 31.4 via INTRAVENOUS
  Filled 2019-03-15: qty 32

## 2019-03-18 ENCOUNTER — Telehealth: Payer: Self-pay | Admitting: *Deleted

## 2019-03-18 ENCOUNTER — Other Ambulatory Visit: Payer: Self-pay

## 2019-03-18 DIAGNOSIS — R0602 Shortness of breath: Secondary | ICD-10-CM

## 2019-03-18 NOTE — Telephone Encounter (Signed)
Please get a 2D echo to assess for structural heart disease. If normal then would refer to pulmonary for SOB workup.  Her labs were normal including Hbg and TSH

## 2019-03-18 NOTE — Telephone Encounter (Signed)
-----   Message from [name], LPN sent at [id number]  2:51 PM EDT -----  ----- Message ----- From: Sueanne Margarita, MD Sent: 03/18/2019   2:26 PM EDT To: [name], LPN, Mosie Lukes, MD  Please let patient know that stress test was fine

## 2019-03-18 NOTE — Telephone Encounter (Signed)
Pt has been notified Myoview results by phone with verbal understanding. Pt denies having chest pain. Pt does c/o of what she describes as head pressure, possibly sinus. She does also c/o DOE, states she was raking leaves in her yard for about 20 minutes and she became sob. Pt states she has noticed her SBP has been going up and wants to know why. I assured her I will send a message to Dr. Radford Pax for further advise. Dr. Radford Pax is covering for Dr. Meda Coffee while she is out of the office at this time. Pt thanked me for the call. The patient has been notified of the result and verbalized understanding.  All questions (if any) were answered. Julaine Hua, [facility] 03/18/2019 3:22 PM

## 2019-03-19 ENCOUNTER — Ambulatory Visit (INDEPENDENT_AMBULATORY_CARE_PROVIDER_SITE_OTHER): Payer: PPO | Admitting: Family Medicine

## 2019-03-19 ENCOUNTER — Other Ambulatory Visit: Payer: Self-pay

## 2019-03-19 ENCOUNTER — Encounter: Payer: Self-pay | Admitting: Family Medicine

## 2019-03-19 VITALS — BP 115/68 | HR 80 | Temp 97.6°F | Resp 18 | Wt 161.2 lb

## 2019-03-19 DIAGNOSIS — R519 Headache, unspecified: Secondary | ICD-10-CM | POA: Insufficient documentation

## 2019-03-19 DIAGNOSIS — R739 Hyperglycemia, unspecified: Secondary | ICD-10-CM | POA: Diagnosis not present

## 2019-03-19 DIAGNOSIS — H9319 Tinnitus, unspecified ear: Secondary | ICD-10-CM | POA: Insufficient documentation

## 2019-03-19 DIAGNOSIS — I25110 Atherosclerotic heart disease of native coronary artery with unstable angina pectoris: Secondary | ICD-10-CM | POA: Diagnosis not present

## 2019-03-19 DIAGNOSIS — Z23 Encounter for immunization: Secondary | ICD-10-CM

## 2019-03-19 DIAGNOSIS — R030 Elevated blood-pressure reading, without diagnosis of hypertension: Secondary | ICD-10-CM

## 2019-03-19 DIAGNOSIS — R51 Headache: Secondary | ICD-10-CM | POA: Diagnosis not present

## 2019-03-19 DIAGNOSIS — H9313 Tinnitus, bilateral: Secondary | ICD-10-CM

## 2019-03-19 NOTE — Assessment & Plan Note (Signed)
with headache MRA ordered

## 2019-03-19 NOTE — Assessment & Plan Note (Signed)
Encouraged increased hydration, 64 ounces of clear fluids daily. Minimize alcohol and caffeine. Eat small frequent meals with lean proteins and complex carbs. Avoid high and low blood sugars. Get adequate sleep, 7-8 hours a night. Needs exercise daily preferably in the morning. Has been present most days for about 3 months and has moved from just left sided to bilateral parietal regions. Will proceed with MRA

## 2019-03-19 NOTE — Progress Notes (Signed)
Subjective:    Patient ID: Victoria [name], female    DOB: 1956/04/11, 63 y.o.   MRN: [id number]  No chief complaint on file.   HPI Patient is in today for evaluation of worsening headaches and development of tinnitus and follow up on chronic medical concerns including CAD, anxiety and more. No recent febrile illness, trauma or falls. She is noting a worsening of her headache over a 3 month period of time. Initially it was concentrated on the left and she felt it originated in her neck but she has been working on her neck but the headache is now bilateral and daily. She also notes tinnitus bilaterally which is more recent. No other neurologic complaints. She just underwent a cardiac work up including stress testing which was good and is just waiting on an echocardiogram. Denies CP/palp/SOB/congestion/fevers/GI or GU c/o. Taking meds as prescribed  Past Medical History:  Diagnosis Date  . 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 Anemia    iron deficiency, resolved  . Anxiety   . Asthma   . CAD (coronary artery disease)    a. LHC 6/16:  oOM1 60, pRCA 25 >> med Rx b. cath 3/19 2nd OM with 95% stenosis s/p synergy DES & anomalous RCA  . CAP (community acquired pneumonia) 2016; 06/07/2016  . Chronic bronchitis ([place])   . Chronic lower back pain   . Complication of anesthesia    "think I have a hard time waking up from it"  . COPD (chronic obstructive pulmonary disease) ([place])   . Depression    mild  . Diverticulosis   . GERD (gastroesophageal reflux disease)   . H/O hiatal hernia   . Headache    "weekly" (08/23/2017)  . History of echocardiogram    Echo 6/16:  Mod LVH, EF 60-65%, no RWMA, Gr 1 DD, trivial MR, normal LA size.  Marland Kitchen Hyperglycemia 11/20/2015  . Hyperlipidemia, mixed 09/11/2007   Qualifier: Diagnosis of  By: Jerold Coombe   Did not tolerate Lipitor, zocor, Lovastatin, Pravastatin, Livalo, Crestor even low dose   . IBS  (irritable bowel syndrome)   . Maxillary sinusitis   . Normal cardiac stress test 11/2011   No evidence of ischemia or infarct. Calculated ejection fraction 72%.  . Obesity   . OSA (obstructive sleep apnea) 02/2012   has stopped using  cpap  . Osteoarthritis   . Osteoporosis   . Pneumonia 11/2011   "before 2013 I hadn't had pneumonia since I was a [name]" (04/13/2012)  . Pulmonary nodules   . S/P angioplasty with stent 08/23/17 ostial 2nd OM with DES synnergy 08/24/2017  . Schatzki's ring     Past Surgical History:  Procedure Laterality Date  . APPENDECTOMY  1989  . CARDIAC CATHETERIZATION N/A 11/25/2014   Procedure: Right/Left Heart Cath and Coronary Angiography;  Surgeon: Belva Crome, MD;  Location: [facility];  Service: Cardiovascular;  Laterality: N/A;  . CESAREAN SECTION  1985  . CORONARY ANGIOPLASTY WITH STENT PLACEMENT  08/23/2017  . CORONARY STENT INTERVENTION N/A 08/23/2017   Procedure: CORONARY STENT INTERVENTION;  Surgeon: Burnell Blanks, MD;  Location: [facility];  Service: Cardiovascular;  Laterality: N/A;  . HERNIA REPAIR  04/13/2012   VHR laparoscopic  . LEFT HEART CATH AND CORONARY ANGIOGRAPHY N/A 08/23/2017   Procedure: LEFT HEART CATH AND CORONARY ANGIOGRAPHY;  Surgeon: Burnell Blanks, MD;  Location: [facility];  Service: Cardiovascular;  Laterality: N/A;  . VENTRAL HERNIA REPAIR  04/13/2012   Procedure: LAPAROSCOPIC VENTRAL HERNIA;  Surgeon: Adin Hector, MD;  Location: [facility];  Service: General;  Laterality: N/A;  laparoscopic repair of incarcerated hernia    Family History  Problem Relation Age of Onset  . Breast cancer Mother   . Hypertension Mother   . Diabetes Mother   . Diverticulosis Father   . Prostate cancer Father   . Pulmonary embolism Brother        recurrent  . Heart attack Maternal Grandfather   . Breast cancer Sister   . Stroke Neg Hx     Social History   Socioeconomic History  . Marital status:  Married    Spouse name: Not on file  . Number of children: 1  . Years of education: Not on file  . Highest education level: Not on file  Occupational History    Employer: UNEMPLOYED  Social Needs  . Financial resource strain: Not on file  . Food insecurity    Worry: Not on file    Inability: Not on file  . Transportation needs    Medical: Not on file    Non-medical: Not on file  Tobacco Use  . Smoking status: Never Smoker  . Smokeless tobacco: Never Used  Substance and Sexual Activity  . Alcohol use: Yes    Alcohol/week: 4.0 standard drinks    Types: 2 Glasses of wine, 2 Cans of beer per week  . Drug use: No  . Sexual activity: Yes    Comment: gluten free, lives with husband and son with CP quadriplegia  Lifestyle  . Physical activity    Days per week: Not on file    Minutes per session: Not on file  . Stress: Not on file  Relationships  . Social Herbalist on phone: Not on file    Gets together: Not on file    Attends religious service: Not on file    Active member of club or organization: Not on file    Attends meetings of clubs or organizations: Not on file    Relationship status: Not on file  . Intimate partner violence    Fear of current or ex partner: Not on file    Emotionally abused: Not on file    Physically abused: Not on file    Forced sexual activity: Not on file  Other Topics Concern  . 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     Outpatient Medications Prior to Visit  Medication Sig Dispense Refill  . acyclovir ointment (ZOVIRAX) 5 % Apply 1 application topically every 3 (three) hours. 15 g 2  . alendronate (FOSAMAX) 70 MG tablet TAKE 1 TABLET BY MOUTH EVERY 7 DAYS. TAKE WITH A FULL GLASS OF WATER ON AN EMPTY STOMACH. 4 tablet 5  . aspirin 81 MG tablet Take 81 mg by mouth daily.     . Benralizumab (FASENRA) 30 MG/ML SOSY Inject 1 mL into the skin every 8 (eight) weeks. 1 mL 11  . Bioflavonoid  Products (ESTER C PO) Take 500 mg by mouth daily.    . Calcium-Magnesium-Vitamin D 300-150-400 MG-MG-UNIT TABS Take 1 tablet by mouth daily.    . Cholecalciferol (VITAMIN D) 2000 UNITS CAPS Take 2,000 Units by mouth daily.     . Evolocumab (REPATHA SURECLICK) [id number] MG/ML SOAJ Inject 1 pen into the skin every 14 (fourteen) days. 6 pen  3  . LORazepam (ATIVAN) 0.5 MG tablet Take 0.5-1 tablets (0.25-0.5 mg total) by mouth at bedtime as needed for anxiety or sleep. 30 tablet 2  . Magnesium Oxide 250 MG TABS Take 250 mg by mouth daily.    . mupirocin ointment (BACTROBAN) 2 % Place 1 application into the nose 2 (two) times daily. 22 g 1  . nitroGLYCERIN (NITROSTAT) 0.4 MG SL tablet PLACE 1 TABLET UNDER THE TONGUE EVERY 5 MINUTES AS NEEDED FOR CHEST PAIN. 25 tablet 4  . venlafaxine XR (EFFEXOR-XR) 37.5 MG 24 hr capsule TAKE 1 CAPSULE BY MOUTH DAILY ALONG WITH 75 MG CAPSULE (112.5 MG TOTAL DAILY) 180 capsule 1  . venlafaxine XR (EFFEXOR-XR) 75 MG 24 hr capsule TAKE 1 CAPSULE BY MOUTH ONCE DAILY ALONG WITH 37.5 MG CAPSULE DAILY. (112.5 MG TOTAL DAILY) 180 capsule 1  . Zinc 30 MG CAPS Take 30 mg by mouth daily.     No facility-administered medications prior to visit.     Allergies  Allergen Reactions  . Beclomethasone Dipropionate Hives and Other (See Comments)     weight gain  . Mometasone Furo-Formoterol Fum Hives and Other (See Comments)    weight gain  . Sulfonamide Derivatives Hives and Rash  . Statins     Myalgias, RLS    Review of Systems  Constitutional: Positive for malaise/fatigue. Negative for fever.  HENT: Positive for tinnitus. Negative for congestion, ear pain and hearing loss.   Eyes: Negative for blurred vision.  Respiratory: Negative for shortness of breath.   Cardiovascular: Negative for chest pain, palpitations and leg swelling.  Gastrointestinal: Negative for abdominal pain, blood in stool and nausea.  Genitourinary: Negative for dysuria and frequency.  Musculoskeletal:  Negative for falls.  Skin: Negative for rash.  Neurological: Positive for headaches. Negative for dizziness and loss of consciousness.  Endo/Heme/Allergies: Negative for environmental allergies.  Psychiatric/Behavioral: Negative for depression. The patient is not nervous/anxious.        Objective:    Physical Exam Vitals signs and nursing note reviewed.  Constitutional:      General: She is not in acute distress.    Appearance: She is well-developed.  HENT:     Head: Normocephalic and atraumatic.     Nose: Nose normal.  Eyes:     General:        Right eye: No discharge.        Left eye: No discharge.  Neck:     Musculoskeletal: Normal range of motion and neck supple.  Cardiovascular:     Rate and Rhythm: Normal rate and regular rhythm.     Heart sounds: No murmur.  Pulmonary:     Effort: Pulmonary effort is normal.     Breath sounds: Normal breath sounds.  Abdominal:     General: Bowel sounds are normal.     Palpations: Abdomen is soft.     Tenderness: There is no abdominal tenderness.  Skin:    General: Skin is warm and dry.  Neurological:     General: No focal deficit present.     Mental Status: She is alert and oriented to person, place, and time.     Cranial Nerves: No cranial nerve deficit.     Sensory: No sensory deficit.     Deep Tendon Reflexes: Reflexes normal.     BP 115/68 (BP Location: Left Arm, Patient Position: Sitting, Cuff Size: Normal)   Pulse 80   Temp 97.6 F (36.4 C) (Temporal)   Resp 18  Wt 161 lb 3.2 oz (73.1 kg)   SpO2 100%   BMI 30.46 kg/m  Wt Readings from Last 3 Encounters:  03/19/19 161 lb 3.2 oz (73.1 kg)  03/15/19 159 lb (72.1 kg)  03/08/19 159 lb 6.4 oz (72.3 kg)    Diabetic Foot Exam - Simple   No data filed     Lab Results  Component Value Date   WBC 5.1 03/11/2019   HGB 13.6 03/11/2019   HCT 41.3 03/11/2019   PLT 297.0 03/11/2019   GLUCOSE 82 03/11/2019   CHOL 141 03/11/2019   TRIG 93.0 03/11/2019   HDL 56.10  03/11/2019   LDLDIRECT 177.8 09/07/2011   LDLCALC 66 03/11/2019   ALT 15 03/11/2019   AST 17 03/11/2019   NA 138 03/11/2019   K 4.2 03/11/2019   CL 102 03/11/2019   CREATININE 0.55 03/11/2019   BUN 11 03/11/2019   CO2 29 03/11/2019   TSH 2.24 03/11/2019   INR 1.0 08/21/2017   HGBA1C 5.7 03/11/2019    Lab Results  Component Value Date   TSH 2.24 03/11/2019   Lab Results  Component Value Date   WBC 5.1 03/11/2019   HGB 13.6 03/11/2019   HCT 41.3 03/11/2019   MCV 91.9 03/11/2019   PLT 297.0 03/11/2019   Lab Results  Component Value Date   NA 138 03/11/2019   K 4.2 03/11/2019   CO2 29 03/11/2019   GLUCOSE 82 03/11/2019   BUN 11 03/11/2019   CREATININE 0.55 03/11/2019   BILITOT 0.4 03/11/2019   ALKPHOS 81 03/11/2019   AST 17 03/11/2019   ALT 15 03/11/2019   PROT 6.3 03/11/2019   ALBUMIN 4.1 03/11/2019   CALCIUM 9.1 03/11/2019   ANIONGAP 6 08/24/2017   GFR 112.05 03/11/2019   Lab Results  Component Value Date   CHOL 141 03/11/2019   Lab Results  Component Value Date   HDL 56.10 03/11/2019   Lab Results  Component Value Date   LDLCALC 66 03/11/2019   Lab Results  Component Value Date   TRIG 93.0 03/11/2019   Lab Results  Component Value Date   CHOLHDL 3 03/11/2019   Lab Results  Component Value Date   HGBA1C 5.7 03/11/2019       Assessment & Plan:   Problem List Items Addressed This Visit    ELEVATED BP READING WITHOUT DX HYPERTENSION (Chronic)    Well controlled. Encouraged heart healthy diet such as the DASH diet and exercise as tolerated.      Hyperglycemia (Chronic)    hgba1c acceptable, minimize simple carbs. Increase exercise as tolerated.       CAD (coronary artery disease)    She recently underwent cardiac work up with good results but they have decided to proceed with an echocardiogram. Given flu shot today      Headache    Encouraged increased hydration, 64 ounces of clear fluids daily. Minimize alcohol and caffeine. Eat small  frequent meals with lean proteins and complex carbs. Avoid high and low blood sugars. Get adequate sleep, 7-8 hours a night. Needs exercise daily preferably in the morning. Has been present most days for about 3 months and has moved from just left sided to bilateral parietal regions. Will proceed with MRA      Relevant Orders   Ambulatory referral to Neurology   MR Angiogram Head Wo Contrast   Tinnitus    with headache MRA ordered       Relevant Orders   MR Angiogram  Head Wo Contrast    Other Visit Diagnoses    Needs flu shot    -  Primary   Relevant Orders   Flu Vaccine QUAD 6+ mos PF IM (Fluarix Quad PF) (Completed)      I have discontinued Victoria [name]. Logie's Zinc. I am also having her maintain her Vitamin D, aspirin, Magnesium Oxide, Calcium-Magnesium-Vitamin D, acyclovir ointment, Benralizumab, Bioflavonoid Products (ESTER C PO), Evolocumab, nitroGLYCERIN, venlafaxine XR, venlafaxine XR, mupirocin ointment, LORazepam, and alendronate.  No orders of the defined types were placed in this encounter.    [name], MD

## 2019-03-19 NOTE — Telephone Encounter (Signed)
I informed the patient that Dr Radford Pax requests that she has an Echo.  She verbalized understanding.

## 2019-03-19 NOTE — Patient Instructions (Signed)
Tinnitus Tinnitus refers to hearing a sound when there is no actual source for that sound. This is often described as ringing in the ears. However, people with this condition may hear a variety of noises, in one ear or in both ears. The sounds of tinnitus can be soft, loud, or somewhere in between. Tinnitus can last for a few seconds or can be constant for days. It may go away without treatment and come back at various times. When tinnitus is constant or happens often, it can lead to other problems, such as trouble sleeping and trouble concentrating. Almost everyone experiences tinnitus at some point. Tinnitus that is long-lasting (chronic) or comes back often (recurs) may require medical attention. What are the causes? The cause of tinnitus is often not known. In some cases, it can result from other problems or conditions, including:  Exposure to loud noises from machinery, music, or other sources.  Hearing loss.  Ear or sinus infections.  Earwax buildup.  An object (foreign body) stuck in the ear.  Taking certain medicines.  Drinking alcohol or caffeine.  High blood pressure.  Heart diseases.  Anemia.  Allergies.  Meniere's disease.  Thyroid problems.  Tumors.  A weak, bulging blood vessel (aneurysm) near the ear.  Depression or other mood disorders. What are the signs or symptoms? The main symptom of tinnitus is hearing a sound when there is no source for that sound. It may sound like:  Buzzing.  Roaring.  Ringing.  Blowing air, like the sound heard when you listen to a seashell.  Hissing.  Whistling.  Sizzling.  Humming.  Running water.  A musical note.  Tapping. Symptoms may affect only one ear (unilateral) or both ears (bilateral). How is this diagnosed? Tinnitus is diagnosed based on your symptoms, your medical history, and a physical exam. Your health care provider may do a thorough hearing test (audiologic exam) if your tinnitus:  Is  unilateral.  Causes hearing difficulties.  Lasts 6 months or longer. You may work with a health care provider who specializes in hearing disorders (audiologist). You may be asked questions about your symptoms and how they affect your daily life. You may have other tests done, such as:  CT scan.  MRI.  An imaging test of how blood flows through your blood vessels (angiogram). How is this treated? Treating an underlying medical condition can sometimes make tinnitus go away. If your tinnitus continues, other treatments may include:  Medicines, such as antidepressants or sleeping aids.  Sound generators to mask the tinnitus. These include: ? Tabletop sound machines that play relaxing sounds to help you fall asleep. ? Wearable devices that fit in your ear and play sounds or music. ? Acoustic neural stimulation. This involves using headphones to listen to music that contains an auditory signal. Over time, listening to this signal may change some pathways in your brain and make you less sensitive to tinnitus. This treatment is used for very severe cases when no other treatment is working.  Therapy and counseling to help you manage the stress of living with tinnitus.  Using hearing aids or cochlear implants if your tinnitus is related to hearing loss. Hearing aids are worn in the outer ear. Cochlear implants are surgically placed in the inner ear. Follow these instructions at home: Managing symptoms      When possible, avoid being in loud places and being exposed to loud sounds.  Wear hearing protection, such as earplugs, when you are exposed to loud noises.  Use  a white noise machine, a humidifier, or other devices to mask the sound of tinnitus.  Practice techniques for reducing stress, such as meditation, yoga, or deep breathing. Work with your health care provider if you need help with managing stress.  Sleep with your head slightly raised. This may reduce the impact of  tinnitus. General instructions  Do not use stimulants, such as nicotine, alcohol, or caffeine. Talk with your health care provider about other stimulants to avoid. Stimulants are substances that can make you feel alert and attentive by increasing certain activities in the body (such as heart rate and blood pressure). These substances may make tinnitus worse.  Take over-the-counter and prescription medicines only as told by your health care provider.  Try to get plenty of sleep each night.  Keep all follow-up visits as told by your health care provider. This is important. Contact a health care provider if:  Your tinnitus continues for 3 weeks or longer without stopping.  Your symptoms get worse or do not get better with home care.  You develop tinnitus after a head injury.  You have tinnitus along with any of the following: ? Dizziness. ? Loss of balance. ? Nausea and vomiting. Summary  Tinnitus refers to hearing a sound when there is no actual source for that sound. This is often described as ringing in the ears.  Symptoms may affect only one ear (unilateral) or both ears (bilateral).  Use a white noise machine, a humidifier, or other devices to mask the sound of tinnitus.  Do not use stimulants, such as nicotine, alcohol, or caffeine. Talk with your health care provider about other stimulants to avoid. These substances may make tinnitus worse. This information is not intended to replace advice given to you by your health care provider. Make sure you discuss any questions you have with your health care provider. Document Released: 06/06/2005 Document Revised: 05/19/2017 Document Reviewed: 03/16/2017 Elsevier Patient Education  2020 [place] Headache Without Cause A headache is pain or discomfort that is felt around the head or neck area. There are many causes and types of headaches. In some cases, the cause may not be found. Follow these instructions at home: Watch  your condition for any changes. Let your doctor know about them. Take these steps to help with your condition: Managing pain      Take over-the-counter and prescription medicines only as told by your doctor.  Lie down in a dark, quiet room when you have a headache.  If told, put ice on your head and neck area: ? Put ice in a plastic bag. ? Place a towel between your skin and the bag. ? Leave the ice on for 20 minutes, 2-3 times per day.  If told, put heat on the affected area. Use the heat source that your doctor recommends, such as a moist heat pack or a heating pad. ? Place a towel between your skin and the heat source. ? Leave the heat on for 20-30 minutes. ? Remove the heat if your skin turns bright red. This is very important if you are unable to feel pain, heat, or cold. You may have a greater risk of getting burned.  Keep lights dim if bright lights bother you or make your headaches worse. Eating and drinking  Eat meals on a regular schedule.  If you drink alcohol: ? Limit how much you use to:  0-1 drink a day for women.  0-2 drinks a day for men. ? Be aware  of how much alcohol is in your drink. In the [place] one drink equals one 12 oz bottle of beer (355 mL), one 5 oz glass of wine (148 mL), or one 1 oz glass of hard liquor (44 mL).  Stop drinking caffeine, or reduce how much caffeine you drink. General instructions   Keep a journal to find out if certain things bring on headaches. For example, write down: ? What you eat and drink. ? How much sleep you get. ? Any change to your diet or medicines.  Get a massage or try other ways to relax.  Limit stress.  Sit up straight. Do not tighten (tense) your muscles.  Do not use any products that contain nicotine or tobacco. This includes cigarettes, e-cigarettes, and chewing tobacco. If you need help quitting, ask your doctor.  Exercise regularly as told by your doctor.  Get enough sleep. This often means 7-9 hours  of sleep each night.  Keep all follow-up visits as told by your doctor. This is important. Contact a doctor if:  Your symptoms are not helped by medicine.  You have a headache that feels different than the other headaches.  You feel sick to your stomach (nauseous) or you throw up (vomit).  You have a fever. Get help right away if:  Your headache gets very bad quickly.  Your headache gets worse after a lot of physical activity.  You keep throwing up.  You have a stiff neck.  You have trouble seeing.  You have trouble speaking.  You have pain in the eye or ear.  Your muscles are weak or you lose muscle control.  You lose your balance or have trouble walking.  You feel like you will pass out (faint) or you pass out.  You are mixed up (confused).  You have a seizure. Summary  A headache is pain or discomfort that is felt around the head or neck area.  There are many causes and types of headaches. In some cases, the cause may not be found.  Keep a journal to help find out what causes your headaches. Watch your condition for any changes. Let your doctor know about them.  Contact a doctor if you have a headache that is different from usual, or if your headache is not helped by medicine.  Get help right away if your headache gets very bad, you throw up, you have trouble seeing, you lose your balance, or you have a seizure. This information is not intended to replace advice given to you by your health care provider. Make sure you discuss any questions you have with your health care provider. Document Released: 03/15/2008 Document Revised: 12/25/2017 Document Reviewed: 12/25/2017 Elsevier Patient Education  2020 [employer].

## 2019-03-19 NOTE — Assessment & Plan Note (Addendum)
She recently underwent cardiac work up with good results but they have decided to proceed with an echocardiogram. Given flu shot today

## 2019-03-19 NOTE — Assessment & Plan Note (Signed)
hgba1c acceptable, minimize simple carbs. Increase exercise as tolerated.  

## 2019-03-19 NOTE — Assessment & Plan Note (Signed)
Well controlled. Encouraged heart healthy diet such as the DASH diet and exercise as tolerated.  

## 2019-03-19 NOTE — Addendum Note (Signed)
Addended by: Frederik Schmidt on: 03/19/2019 08:16 AM   Modules accepted: Orders

## 2019-03-20 ENCOUNTER — Ambulatory Visit (HOSPITAL_COMMUNITY): Payer: PPO | Attending: Cardiovascular Disease

## 2019-03-20 DIAGNOSIS — R0602 Shortness of breath: Secondary | ICD-10-CM

## 2019-03-21 ENCOUNTER — Telehealth: Payer: Self-pay

## 2019-03-21 DIAGNOSIS — I5032 Chronic diastolic (congestive) heart failure: Secondary | ICD-10-CM

## 2019-03-21 NOTE — Telephone Encounter (Signed)
-----   Message from Sueanne Margarita, MD sent at 03/21/2019 10:47 AM EDT ----- Echo showed normal LVF with mildly thickened heart muscle and increased stiffness of heart muscle.  milldy leaky AV

## 2019-03-21 NOTE — Telephone Encounter (Signed)
Notes recorded by Frederik Schmidt, RN on 03/21/2019 at 11:02 AM EDT  The patient has been notified of the result and verbalized understanding. All questions (if any) were answered.  Frederik Schmidt, RN 03/21/2019 11:02 AM

## 2019-03-22 ENCOUNTER — Other Ambulatory Visit: Payer: PPO

## 2019-03-22 ENCOUNTER — Other Ambulatory Visit: Payer: Self-pay | Admitting: Cardiovascular Disease

## 2019-03-27 ENCOUNTER — Other Ambulatory Visit: Payer: Self-pay | Admitting: Family Medicine

## 2019-03-27 MED ORDER — ALPRAZOLAM 0.25 MG PO TABS: ORAL_TABLET | ORAL | 0 refills | Status: DC

## 2019-03-27 NOTE — Progress Notes (Unsigned)
alpra

## 2019-03-28 ENCOUNTER — Telehealth: Payer: Self-pay

## 2019-03-28 NOTE — Telephone Encounter (Signed)
Spoke with patient about medication for her MRI she voiced her understanding.

## 2019-03-28 NOTE — Telephone Encounter (Signed)
-----   Message from Mosie Lukes, MD sent at 03/27/2019  9:39 PM EDT ----- Regarding: RE: mr brain I sent in some Alprazolam for her and then noted she has some Lorazepam Bobby Barton remind her it is similar so not to take together but works faster and wears off faster so should help her with the MR ----- Message ----- From: Katha Hamming Sent: 03/27/2019   1:28 PM EDT To: Mosie Lukes, MD Subject: mr brain                                       [name] requests some claustrophobia medication called in for her MRI scheduled this Saturday 03/30/19 at 11 am.  Thanks so much, [employer]

## 2019-03-30 ENCOUNTER — Other Ambulatory Visit: Payer: Self-pay

## 2019-03-30 ENCOUNTER — Ambulatory Visit (HOSPITAL_BASED_OUTPATIENT_CLINIC_OR_DEPARTMENT_OTHER)
Admission: RE | Admit: 2019-03-30 | Discharge: 2019-03-30 | Disposition: A | Discharge status: Home / Self Care | Payer: PPO | Source: Ambulatory Visit | Attending: Family Medicine | Admitting: Family Medicine

## 2019-03-30 DIAGNOSIS — R519 Headache, unspecified: Secondary | ICD-10-CM | POA: Diagnosis not present

## 2019-03-30 DIAGNOSIS — H9313 Tinnitus, bilateral: Secondary | ICD-10-CM | POA: Diagnosis not present

## 2019-03-30 MED ORDER — GADOBUTROL 1 MMOL/ML IV SOLN
7.5000 mL | Freq: Once | INTRAVENOUS | Status: AC | PRN
  Administered 2019-03-30: 7.5 mL via INTRAVENOUS

## 2019-03-31 ENCOUNTER — Encounter: Payer: Self-pay | Admitting: Family Medicine

## 2019-04-01 ENCOUNTER — Other Ambulatory Visit: Payer: PPO | Admitting: *Deleted

## 2019-04-01 ENCOUNTER — Other Ambulatory Visit: Payer: Self-pay

## 2019-04-01 DIAGNOSIS — I5032 Chronic diastolic (congestive) heart failure: Secondary | ICD-10-CM | POA: Diagnosis not present

## 2019-04-02 LAB — PRO B NATRIURETIC PEPTIDE: NT-Pro BNP: 158 pg/mL (ref 0–287)

## 2019-04-09 DIAGNOSIS — M542 Cervicalgia: Secondary | ICD-10-CM | POA: Diagnosis not present

## 2019-04-15 ENCOUNTER — Telehealth: Payer: Self-pay | Admitting: Internal Medicine

## 2019-04-15 NOTE — Telephone Encounter (Signed)
[name] Order: 30mg  #1 prefilled syringe Ordered date: 04/15/19  Expected date of arrival: 10/27-10/28 Ordered by: Parke Poisson, [place]  Specialty Pharmacy: [facility]

## 2019-04-16 NOTE — Telephone Encounter (Signed)
[facility] Shipment Received:  30mg  #1 prefilled syringe Medication arrival date: 04/16/2019 Lot #: [id number] Exp date: 04/2020 Received by: Desmond Dike, [place]

## 2019-04-25 ENCOUNTER — Ambulatory Visit (INDEPENDENT_AMBULATORY_CARE_PROVIDER_SITE_OTHER): Payer: PPO

## 2019-04-25 ENCOUNTER — Other Ambulatory Visit: Payer: Self-pay

## 2019-04-25 DIAGNOSIS — J4550 Severe persistent asthma, uncomplicated: Secondary | ICD-10-CM

## 2019-04-25 MED ORDER — BENRALIZUMAB 30 MG/ML ~~LOC~~ SOSY
30.0000 mg | PREFILLED_SYRINGE | Freq: Once | SUBCUTANEOUS | Status: AC
  Administered 2019-04-25: 30 mg via SUBCUTANEOUS

## 2019-04-25 NOTE — Progress Notes (Signed)
All questions were answered by the patient before medication was administered. Have you been hospitalized in the last 10 days? No Do you have a fever? No Do you have a cough? No Do you have a headache or sore throat? No  

## 2019-04-30 DIAGNOSIS — M542 Cervicalgia: Secondary | ICD-10-CM | POA: Diagnosis not present

## 2019-05-08 ENCOUNTER — Ambulatory Visit: Payer: PPO | Admitting: Cardiology

## 2019-05-08 ENCOUNTER — Other Ambulatory Visit: Payer: Self-pay

## 2019-05-08 ENCOUNTER — Encounter: Payer: Self-pay | Admitting: Cardiology

## 2019-05-08 VITALS — BP 120/80 | HR 73 | Ht 61.0 in | Wt 164.0 lb

## 2019-05-08 DIAGNOSIS — G720 Drug-induced myopathy: Secondary | ICD-10-CM

## 2019-05-08 DIAGNOSIS — E785 Hyperlipidemia, unspecified: Secondary | ICD-10-CM

## 2019-05-08 DIAGNOSIS — R519 Headache, unspecified: Secondary | ICD-10-CM

## 2019-05-08 DIAGNOSIS — M791 Myalgia, unspecified site: Secondary | ICD-10-CM

## 2019-05-08 DIAGNOSIS — M542 Cervicalgia: Secondary | ICD-10-CM | POA: Diagnosis not present

## 2019-05-08 DIAGNOSIS — I25110 Atherosclerotic heart disease of native coronary artery with unstable angina pectoris: Secondary | ICD-10-CM

## 2019-05-08 DIAGNOSIS — T466X5A Adverse effect of antihyperlipidemic and antiarteriosclerotic drugs, initial encounter: Secondary | ICD-10-CM

## 2019-05-08 NOTE — Patient Instructions (Addendum)
Medication Instructions:  Your physician recommends that you continue on your current medications as directed. Please refer to the Current Medication list given to you today.   *If you need a refill on your cardiac medications before your next appointment, please call your pharmacy*  Lab Work: None   If you have labs (blood work) drawn today and your tests are completely normal, you will receive your results only by: Marland Kitchen MyChart Message (if you have MyChart) OR . A paper copy in the mail If you have any lab test that is abnormal or we need to change your treatment, we will call you to review the results.  Testing/Procedures: None   Follow-Up: At [facility], you and your health needs are our priority.  As part of our continuing mission to provide you with exceptional heart care, we have created designated Provider Care Teams.  These Care Teams include your primary Cardiologist (physician) and Advanced Practice Providers (APPs -  Physician Assistants and Nurse Practitioners) who all work together to provide you with the care you need, when you need it.  Your next appointment:   6 month(s)  The format for your next appointment:   In Person  Provider:   You may see Ena Dawley, MD or one of the following Advanced Practice Providers on your designated Care Team:    Melina Copa, PA-C  Ermalinda Barrios, PA-C   Other Instructions  Lifestyle Modifications to Prevent and Treat Heart Disease -Recommend heart healthy/Mediterranean diet, with whole grains, fruits, vegetables, fish, lean meats, nuts, olive oil and avocado oil.  -Limit salt intake to less than 2000 mg per day.  -Recommend moderate walking, starting slowly with a few minutes and working up to 3-5 times/week for 30-50 minutes each session. Aim for at least 150 minutes.week. Goal should be pace of 3 miles/hours, or walking 1.5 miles in 30 minutes -Recommend avoidance of tobacco products. Avoid excess alcohol. -Keep blood  pressure well controlled, ideally less than 130/80.     Mindfulness-Based Stress Reduction Mindfulness-based stress reduction (MBSR) is a program that helps people learn to practice mindfulness. Mindfulness is the practice of intentionally paying attention to the present moment. It can be learned and practiced through techniques such as education, breathing exercises, meditation, and yoga. MBSR includes several mindfulness techniques in one program. MBSR works best when you understand the treatment, are willing to try new things, and can commit to spending time practicing what you learn. MBSR training may include learning about:  How your emotions, thoughts, and reactions affect your body.  New ways to respond to things that cause negative thoughts to start (triggers).  How to notice your thoughts and let go of them.  Practicing awareness of everyday things that you normally do without thinking.  The techniques and goals of different types of meditation. What are the benefits of MBSR? MBSR can have many benefits, which include helping you to:  Develop self-awareness. This refers to knowing and understanding yourself.  Learn skills and attitudes that help you to participate in your own health care.  Learn new ways to care for yourself.  Be more accepting about how things are, and let things go.  Be less judgmental and approach things with an open mind.  Be patient with yourself and trust yourself more. MBSR has also been shown to:  Reduce negative emotions, such as depression and anxiety.  Improve memory and focus.  Change how you sense and approach pain.  Boost your body's ability to fight infections.  Help you connect better with other people.  Improve your sense of well-being. Follow these instructions at home:   Find a local in-person or online MBSR program.  Set aside some time regularly for mindfulness practice.  Find a mindfulness practice that works best for  you. This may include one or more of the following: ? Meditation. Meditation involves focusing your mind on a certain thought or activity. ? Breathing awareness exercises. These help you to stay present by focusing on your breath. ? Body scan. For this practice, you lie down and pay attention to each part of your body from head to toe. You can identify tension and soreness and intentionally relax parts of your body. ? Yoga. Yoga involves stretching and breathing, and it can improve your ability to move and be flexible. It can also provide an experience of testing your body's limits, which can help you release stress. ? Mindful eating. This way of eating involves focusing on the taste, texture, color, and smell of each bite of food. Because this slows down eating and helps you feel full sooner, it can be an important part of a weight-loss plan.  Find a podcast or recording that provides guidance for breathing awareness, body scan, or meditation exercises. You can listen to these any time when you have a free moment to rest without distractions.  Follow your treatment plan as told by your health care provider. This may include taking regular medicines and making changes to your diet or lifestyle as recommended. How to practice mindfulness To do a basic awareness exercise:  Find a comfortable place to sit.  Pay attention to the present moment. Observe your thoughts, feelings, and surroundings just as they are.  Avoid placing judgment on yourself, your feelings, or your surroundings. Make note of any judgment that comes up, and let it go.  Your mind may wander, and that is okay. Make note of when your thoughts drift, and return your attention to the present moment. To do basic mindfulness meditation:  Find a comfortable place to sit. This may include a stable chair or a firm floor cushion. ? Sit upright with your back straight. Let your arms fall next to your side with your hands resting on your  legs. ? If sitting in a chair, rest your feet flat on the floor. ? If sitting on a cushion, cross your legs in front of you.  Keep your head in a neutral position with your chin dropped slightly. Relax your jaw and rest the tip of your tongue on the roof of your mouth. Drop your gaze to the floor. You can close your eyes if you like.  Breathe normally and pay attention to your breath. Feel the air moving in and out of your nose. Feel your belly expanding and relaxing with each breath.  Your mind may wander, and that is okay. Make note of when your thoughts drift, and return your attention to your breath.  Avoid placing judgment on yourself, your feelings, or your surroundings. Make note of any judgment or feelings that come up, let them go, and bring your attention back to your breath.  When you are ready, lift your gaze or open your eyes. Pay attention to how your body feels after the meditation. Where to find more information You can find more information about MBSR from:  Your health care provider.  Community-based meditation centers or programs.  Programs offered near you. Summary  Mindfulness-based stress reduction (MBSR) is a program that teaches  you how to intentionally pay attention to the present moment. It is used with other treatments to help you cope better with daily stress, emotions, and pain.  MBSR focuses on developing self-awareness, which allows you to respond to life stress without judgment or negative emotions.  MBSR programs may involve learning different mindfulness practices, such as breathing exercises, meditation, yoga, body scan, or mindful eating. Find a mindfulness practice that works best for you, and set aside time for it on a regular basis. This information is not intended to replace advice given to you by your health care provider. Make sure you discuss any questions you have with your health care provider. Document Released: 10/13/2016 Document Revised:  05/19/2017 Document Reviewed: 10/13/2016 Elsevier Patient Education  2020 [employer].

## 2019-05-08 NOTE — Progress Notes (Addendum)
Cardiology Office Note:    Date:  05/08/2019   ID:  Victoria [name], DOB 07/19/1955, MRN [id number]  PCP:  Victoria Lukes, MD  Cardiologist:  Victoria Dawley, MD  Referring MD: Victoria Lukes, MD   Chief Complaint  Patient presents with   Follow-up   Chest Pain   Coronary Artery Disease    History of Present Illness:    Victoria [name] is a 63 y.o. female with a past medical history significant for CAD, hyperlipidemia, GERD and hiatal hernia. Cardiac catheterization in 08/2017 showed single-vessel severe OM stenosis that was treated with drug-eluting stent.  LVEF was normal.  Hyperlipidemia is treated with PCSK9 inhibitor.  Plavix was discontinued in March.  The patient was last seen by Dr. Meda [name] by telemedicine on 03/08/2019 with complaints of chest pain on exertion and at rest.  Nitroglycerin and drinking cold water helped her symptoms.  She was ordered an exercise Myoview which was low risk.  She also underwent echocardiogram which showed normal LVEF,  Mildly increased left ventricular posterior wall thickness and mildly increased left ventricular hypertrophy.  She also underwent carotid Dopplers which were essentially normal.  She had normal cervical spine x-ray on 03/07/2019.  Brain MRI was normal.  Her PCP was considering physical therapy.    The patient is here today for follow-up. She is having no cardiac symptoms.  Her chest pain has resolved.  She denies chest pain/pressure, shortness of breath, orthopnea, PND, edema, palpitations, lightheadedness or syncope.    She is having a lot of other complaints including a dull buzzing noise in her head just above her ears on both sides she has been having some neck pain.  She has been seen by her primary care provider and neurology.  She has been getting physical therapy which is ongoing.  She also had 2 weeks relief of her symptoms after she was planned for a neurology follow-up tomorrow but she deferred this until after she finishes  physical therapy.  She is concerned that her blood pressures are above her baseline, however she shows me her home blood pressure readings which are in the 120s-130s over 80s.  On Monday it was 118/75.  Yesterday it was 143/82.  She says that a couple of years ago she had a blood pressure spike when she had a viselike headache.  I do not see any very high blood pressures that are very concerning.  She notes that she has had anxiety for years and on Effexor for about 20 years.  She says that she has had some recent breakthrough anxiety, possibly related to recently diagnosed   Cardiac studies   Echocardiogram 03/20/2019 IMPRESSIONS  1. Left ventricular ejection fraction, by visual estimation, is 60 to 65%. The left ventricle has normal function. Normal left ventricular size. Mildly increased left ventricular posterior wall thickness. There is mildly increased left ventricular  hypertrophy.  2. Elevated left ventricular end-diastolic pressure.  3. Left ventricular diastolic Doppler parameters are consistent with impaired relaxation pattern of LV diastolic filling.  4. Global right ventricle has normal systolic function.The right ventricular size is normal. No increase in right ventricular wall thickness.  5. Left atrial size was normal.  6. Right atrial size was normal.  7. The mitral valve is normal in structure. No evidence of mitral valve regurgitation. No evidence of mitral stenosis.  8. The tricuspid valve is normal in structure. Tricuspid valve regurgitation is trivial.  9. The aortic valve is normal in structure. Aortic valve  regurgitation is mild by color flow Doppler. Structurally normal aortic valve, with no evidence of sclerosis or stenosis. 10. The pulmonic valve was normal in structure. Pulmonic valve regurgitation is not visualized by color flow Doppler. 11. Aortic dilatation noted. 12. There is mild dilatation of the ascending aorta measuring 36 mm. 13. Normal pulmonary artery  systolic pressure. 14. The inferior vena cava is normal in size with greater than 50% respiratory variability, suggesting right atrial pressure of 3 mmHg.  Exercise Myoview 03/15/2019 Study Highlights   Nuclear stress EF: 74%.  There was no ST segment deviation noted during stress.  No T wave inversion was noted during stress.  Blood pressure demonstrated a hypertensive response to exercise.  The study is normal.  This is a low risk study.   Low risk stress nuclear study with normal perfusion and normal left ventricular regional and global systolic function.    Carotid ultrasound 03/12/2019 Summary: Right Carotid: Velocities in the right ICA are consistent with a 1-39% stenosis.  Left Carotid: Velocities in the left ICA are consistent with a 1-39% stenosis.               Non-hemodynamically significant plaque <50% noted in the CCA.  Vertebrals:  Bilateral vertebral arteries demonstrate antegrade flow. Subclavians: Normal flow hemodynamics were seen in bilateral subclavian              arteries.   Left heart cath 08/23/2017  Conclusion   Ost 2nd Mrg lesion is 95% stenosed.  A drug-eluting stent was successfully placed using a STENT SYNERGY DES 3X16.  Post intervention, there is a 0% residual stenosis.  The left ventricular systolic function is normal.  LV end diastolic pressure is normal.  The left ventricular ejection fraction is 55-65% by visual estimate.  There is no mitral valve regurgitation.   1. Severe single vessel CAD with severe stenosis in the ostium of the large obtuse marginal branch.  2. Successful PTCA/DES x 1 ostium of the OM. 3. 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  4. The LAD has no obstructive disease.  5. Normal LV systolic function  Recommendations: Will continue DAPT with ASA and Plavix for at least one year. Consider addition of a beta blocker or nitrate.       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 LHC 6/16:  oOM1 60, pRCA 25 >> med Rx b. cath 3/19 2nd OM with 95% stenosis s/p synergy DES & anomalous RCA   CAP (community acquired pneumonia) 2016; 06/07/2016   Chronic bronchitis (HCC)    Chronic lower back pain    Complication of anesthesia    "think I have a hard time waking up from it"   COPD (chronic obstructive pulmonary disease) ([place])    Depression    mild   Diverticulosis    GERD (gastroesophageal reflux disease)    H/O hiatal hernia    Headache    "weekly" (08/23/2017)   History of echocardiogram    Echo 6/16:  Mod LVH, EF 60-65%, no RWMA, Gr 1 DD, trivial MR, normal LA size.   Hyperglycemia 11/20/2015   Hyperlipidemia, mixed 09/11/2007   Qualifier: Diagnosis of  By: Jerold Coombe   Did  not tolerate Lipitor, zocor, Lovastatin, Pravastatin, Livalo, Crestor even low dose    IBS (irritable bowel syndrome)    Maxillary sinusitis    Normal cardiac stress test 11/2011   No evidence of ischemia or infarct. Calculated ejection fraction 72%.   Obesity    OSA (obstructive sleep apnea) 02/2012   has stopped using  cpap   Osteoarthritis    Osteoporosis    Pneumonia 11/2011   "before 2013 I hadn't had pneumonia since I was a child" (04/13/2012)   Pulmonary nodules    S/P angioplasty with stent 08/23/17 ostial 2nd OM with DES synnergy 08/24/2017   Schatzki's ring     Past Surgical History:  Procedure Laterality Date   APPENDECTOMY  1989   CARDIAC CATHETERIZATION N/A 11/25/2014   Procedure: Right/Left Heart Cath and Coronary Angiography;  Surgeon: Belva Crome, MD;  Location: [facility];  Service: Cardiovascular;  Laterality: N/A;   [place] WITH STENT PLACEMENT  08/23/2017    CORONARY STENT INTERVENTION N/A 08/23/2017   Procedure: CORONARY STENT INTERVENTION;  Surgeon: Burnell Blanks, MD;  Location: [facility];  Service: Cardiovascular;  Laterality: N/A;   HERNIA REPAIR  04/13/2012   VHR laparoscopic   LEFT HEART CATH AND CORONARY ANGIOGRAPHY N/A 08/23/2017   Procedure: LEFT HEART CATH AND CORONARY ANGIOGRAPHY;  Surgeon: Burnell Blanks, MD;  Location: [facility];  Service: Cardiovascular;  Laterality: N/A;   VENTRAL HERNIA REPAIR  04/13/2012   Procedure: LAPAROSCOPIC VENTRAL HERNIA;  Surgeon: Adin Hector, MD;  Location: [facility];  Service: General;  Laterality: N/A;  laparoscopic repair of incarcerated hernia    Current Medications: Current Meds  Medication Sig   acyclovir ointment (ZOVIRAX) 5 % Apply 1 application topically every 3 (three) hours.   alendronate (FOSAMAX) 70 MG tablet TAKE 1 TABLET BY MOUTH EVERY 7 DAYS. TAKE WITH A FULL GLASS OF WATER ON AN EMPTY STOMACH.   ALPRAZolam (XANAX) 0.25 MG tablet 1 tab po 30 minutes prior to imaging, repeat x 1 as needed   aspirin 81 MG tablet Take 81 mg by mouth daily.    Benralizumab (FASENRA) 30 MG/ML SOSY Inject 1 mL into the skin every 8 (eight) weeks.   Bioflavonoid Products (ESTER C PO) Take 500 mg by mouth daily.   Calcium-Magnesium-Vitamin D 300-150-400 MG-MG-UNIT TABS Take 1 tablet by mouth daily.   Cholecalciferol (VITAMIN D) 2000 UNITS CAPS Take 2,000 Units by mouth daily.    Evolocumab (REPATHA SURECLICK) XX123456 MG/ML SOAJ Inject 1 pen into the skin every 14 (fourteen) days.   LORazepam (ATIVAN) 0.5 MG tablet Take 0.5-1 tablets (0.25-0.5 mg total) by mouth at bedtime as needed for anxiety or sleep.   Magnesium Oxide 250 MG TABS Take 250 mg by mouth daily.   mupirocin ointment (BACTROBAN) 2 % Place 1 application into the nose 2 (two) times daily.   nitroGLYCERIN (NITROSTAT) 0.4 MG SL tablet PLACE 1 TABLET UNDER THE TONGUE EVERY 5 MINUTES AS NEEDED FOR CHEST PAIN.    venlafaxine XR (EFFEXOR-XR) 37.5 MG 24 hr capsule TAKE 1 CAPSULE BY MOUTH DAILY ALONG WITH 75 MG CAPSULE (112.5 MG TOTAL DAILY)   venlafaxine XR (EFFEXOR-XR) 75 MG 24 hr capsule TAKE 1 CAPSULE BY MOUTH ONCE DAILY ALONG WITH 37.5 MG CAPSULE DAILY. (112.5 MG TOTAL DAILY)     Allergies:   Beclomethasone dipropionate, Mometasone furo-formoterol fum, Sulfonamide derivatives, and Statins   Social History   Socioeconomic History  Marital status: Married    Spouse name: Not on file   Number of children: 1   Years of education: Not on file   Highest education level: Not on file  Occupational History    Employer: UNEMPLOYED  Social Needs   Financial resource strain: Not on file   Food insecurity    Worry: Not on file    Inability: Not on file   Transportation needs    Medical: Not on file    Non-medical: Not on file  Tobacco Use   Smoking status: Never Smoker   Smokeless tobacco: Never Used  Substance and Sexual Activity   Alcohol use: Yes    Alcohol/week: 4.0 standard drinks    Types: 2 Glasses of wine, 2 Cans of beer per week   Drug use: No   Sexual activity: Yes    Comment: gluten free, lives with husband and son with CP quadriplegia  Lifestyle   Physical activity    Days per week: Not on file    Minutes per session: Not on file   Stress: Not on file  Relationships   Social connections    Talks on phone: Not on file    Gets together: Not on file    Attends religious service: Not on file    Active member of club or organization: Not on file    Attends meetings of clubs or organizations: Not on file    Relationship status: Not on file  Other Topics Concern   Not on file  Social History Narrative   Cares for a 49yo son with cerebral palsy.      Family History: The patient's family history includes Breast cancer in her mother and sister; Diabetes in her mother; Diverticulosis in her father; Heart attack in her maternal grandfather; Hypertension in her  mother; Prostate cancer in her father; Pulmonary embolism in her brother. There is no history of Stroke. ROS:   Please see the history of present illness.     All other systems reviewed and are negative.   EKG:  EKG is ordered today.  The ekg ordered today demonstrates normal sinus rhythm, 73 bpm, no significant abnormalities.  Recent Labs: 03/11/2019: ALT 15; BUN 11; Creatinine, Ser 0.55; Hemoglobin 13.6; Platelets 297.0; Potassium 4.2; Sodium 138; TSH 2.24 04/01/2019: NT-Pro BNP 158   Recent Lipid Panel    Component Value Date/Time   CHOL 141 03/11/2019 0824   CHOL 227 (H) 07/25/2016 0822   TRIG 93.0 03/11/2019 0824   HDL 56.10 03/11/2019 0824   HDL 51 07/25/2016 0822   CHOLHDL 3 03/11/2019 0824   VLDL 18.6 03/11/2019 0824   LDLCALC 66 03/11/2019 0824   LDLCALC 160 (H) 07/25/2016 0822   LDLDIRECT 177.8 09/07/2011 0945    Physical Exam:    VS:  BP 120/80    Pulse 73    Ht 5\' 1"  (1.549 m)    Wt 164 lb (74.4 kg)    SpO2 99%    BMI 30.99 kg/m     Wt Readings from Last 6 Encounters:  05/08/19 164 lb (74.4 kg)  03/19/19 161 lb 3.2 oz (73.1 kg)  03/15/19 159 lb (72.1 kg)  03/08/19 159 lb 6.4 oz (72.3 kg)  02/14/19 161 lb 3.2 oz (73.1 kg)  12/28/18 158 lb 9.6 oz (71.9 kg)     Physical Exam  Constitutional: She is oriented to person, place, and time. She appears well-developed and well-nourished. No distress.  HENT:  Head: Normocephalic and atraumatic.  Neck:  Normal range of motion. Neck supple. No JVD present.  Cardiovascular: Normal rate, regular rhythm, normal heart sounds and intact distal pulses. Exam reveals no gallop and no friction rub.  No murmur heard. Pulmonary/Chest: Effort normal and breath sounds normal. No respiratory distress. She has no wheezes. She has no rales.  Abdominal: Soft. Bowel sounds are normal.  Musculoskeletal: Normal range of motion.        General: No edema.  Neurological: She is alert and oriented to person, place, and time.  Skin: Skin is  warm and dry.  Psychiatric: She has a normal mood and affect. Her behavior is normal. Judgment and thought content normal.  Vitals reviewed.    ASSESSMENT:    1. Coronary artery disease involving native coronary artery of native heart with unstable angina pectoris ([place])   2. Hyperlipidemia, unspecified hyperlipidemia type    PLAN:    In order of problems listed above:  CAD -S/p DES to OM in 08/2017.  -Patient had recent complaints of chest pain, with normal echocardiogram and stress test in September.  Today she says that she is no longer having any chest pain. -She continues on aspirin therapy and PCSK9 inhibitor.  Hyperlipidemia, Goal LDL <70 -Pt was intolerant to statins. On Repatha for about 3 years with LDL at goal.   Head/neck pain -The patient is being followed by primary care and neurology.  MRI of the brain was normal.  Cervical x-ray was normal.  She is undergoing physical therapy.  She has a follow-up with neurology in January.   Medication Adjustments/Labs and Tests Ordered: Current medicines are reviewed at length with the patient today.  Concerns regarding medicines are outlined above. Labs and tests ordered and medication changes are outlined in the patient instructions below:  Patient Instructions  Medication Instructions:  Your physician recommends that you continue on your current medications as directed. Please refer to the Current Medication list given to you today.   *If you need a refill on your cardiac medications before your next appointment, please call your pharmacy*  Lab Work: None   If you have labs (blood work) drawn today and your tests are completely normal, you will receive your results only by:  [place] (if you have MyChart) OR  A paper copy in the mail If you have any lab test that is abnormal or we need to change your treatment, we will call you to review the results.  Testing/Procedures: None   Follow-Up: At [facility],  you and your health needs are our priority.  As part of our continuing mission to provide you with exceptional heart care, we have created designated Provider Care Teams.  These Care Teams include your primary Cardiologist (physician) and Advanced Practice Providers (APPs -  Physician Assistants and Nurse Practitioners) who all work together to provide you with the care you need, when you need it.  Your next appointment:   6 month(s)  The format for your next appointment:   In Person  Provider:   You may see Victoria Dawley, MD or one of the following Advanced Practice Providers on your designated Care Team:    Melina Copa, PA-C  Ermalinda Barrios, PA-C   Other Instructions  Lifestyle Modifications to Prevent and Treat Heart Disease -Recommend heart healthy/Mediterranean diet, with whole grains, fruits, vegetables, fish, lean meats, nuts, olive oil and avocado oil.  -Limit salt intake to less than 2000 mg per day.  -Recommend moderate walking, starting slowly with a few minutes and working  up to 3-5 times/week for 30-50 minutes each session. Aim for at least 150 minutes.week. Goal should be pace of 3 miles/hours, or walking 1.5 miles in 30 minutes -Recommend avoidance of tobacco products. Avoid excess alcohol. -Keep blood pressure well controlled, ideally less than 130/80.     Mindfulness-Based Stress Reduction Mindfulness-based stress reduction (MBSR) is a program that helps people learn to practice mindfulness. Mindfulness is the practice of intentionally paying attention to the present moment. It can be learned and practiced through techniques such as education, breathing exercises, meditation, and yoga. MBSR includes several mindfulness techniques in one program. MBSR works best when you understand the treatment, are willing to try new things, and can commit to spending time practicing what you learn. MBSR training may include learning about:  How your emotions, thoughts, and reactions  affect your body.  New ways to respond to things that cause negative thoughts to start (triggers).  How to notice your thoughts and let go of them.  Practicing awareness of everyday things that you normally do without thinking.  The techniques and goals of different types of meditation. What are the benefits of MBSR? MBSR can have many benefits, which include helping you to:  Develop self-awareness. This refers to knowing and understanding yourself.  Learn skills and attitudes that help you to participate in your own health care.  Learn new ways to care for yourself.  Be more accepting about how things are, and let things go.  Be less judgmental and approach things with an open mind.  Be patient with yourself and trust yourself more. MBSR has also been shown to:  Reduce negative emotions, such as depression and anxiety.  Improve memory and focus.  Change how you sense and approach pain.  Boost your body's ability to fight infections.  Help you connect better with other people.  Improve your sense of well-being. Follow these instructions at home:   Find a local in-person or online MBSR program.  Set aside some time regularly for mindfulness practice.  Find a mindfulness practice that works best for you. This may include one or more of the following: ? Meditation. Meditation involves focusing your mind on a certain thought or activity. ? Breathing awareness exercises. These help you to stay present by focusing on your breath. ? Body scan. For this practice, you lie down and pay attention to each part of your body from head to toe. You can identify tension and soreness and intentionally relax parts of your body. ? Yoga. Yoga involves stretching and breathing, and it can improve your ability to move and be flexible. It can also provide an experience of testing your body's limits, which can help you release stress. ? Mindful eating. This way of eating involves focusing on  the taste, texture, color, and smell of each bite of food. Because this slows down eating and helps you feel full sooner, it can be an important part of a weight-loss plan.  Find a podcast or recording that provides guidance for breathing awareness, body scan, or meditation exercises. You can listen to these any time when you have a free moment to rest without distractions.  Follow your treatment plan as told by your health care provider. This may include taking regular medicines and making changes to your diet or lifestyle as recommended. How to practice mindfulness To do a basic awareness exercise:  Find a comfortable place to sit.  Pay attention to the present moment. Observe your thoughts, feelings, and surroundings just as  they are.  Avoid placing judgment on yourself, your feelings, or your surroundings. Make note of any judgment that comes up, and let it go.  Your mind may wander, and that is okay. Make note of when your thoughts drift, and return your attention to the present moment. To do basic mindfulness meditation:  Find a comfortable place to sit. This may include a stable chair or a firm floor cushion. ? Sit upright with your back straight. Let your arms fall next to your side with your hands resting on your legs. ? If sitting in a chair, rest your feet flat on the floor. ? If sitting on a cushion, cross your legs in front of you.  Keep your head in a neutral position with your chin dropped slightly. Relax your jaw and rest the tip of your tongue on the roof of your mouth. Drop your gaze to the floor. You can close your eyes if you like.  Breathe normally and pay attention to your breath. Feel the air moving in and out of your nose. Feel your belly expanding and relaxing with each breath.  Your mind may wander, and that is okay. Make note of when your thoughts drift, and return your attention to your breath.  Avoid placing judgment on yourself, your feelings, or your  surroundings. Make note of any judgment or feelings that come up, let them go, and bring your attention back to your breath.  When you are ready, lift your gaze or open your eyes. Pay attention to how your body feels after the meditation. Where to find more information You can find more information about MBSR from:  Your health care provider.  Community-based meditation centers or programs.  Programs offered near you. Summary  Mindfulness-based stress reduction (MBSR) is a program that teaches you how to intentionally pay attention to the present moment. It is used with other treatments to help you cope better with daily stress, emotions, and pain.  MBSR focuses on developing self-awareness, which allows you to respond to life stress without judgment or negative emotions.  MBSR programs may involve learning different mindfulness practices, such as breathing exercises, meditation, yoga, body scan, or mindful eating. Find a mindfulness practice that works best for you, and set aside time for it on a regular basis. This information is not intended to replace advice given to you by your health care provider. Make sure you discuss any questions you have with your health care provider. Document Released: 10/13/2016 Document Revised: 05/19/2017 Document Reviewed: 10/13/2016 Elsevier Patient Education  2020 [place], Daune Perch, NP  05/08/2019 2:19 PM    [facility]

## 2019-05-09 ENCOUNTER — Ambulatory Visit: Payer: PPO | Admitting: Neurology

## 2019-05-13 DIAGNOSIS — D2271 Melanocytic nevi of right lower limb, including hip: Secondary | ICD-10-CM | POA: Diagnosis not present

## 2019-05-13 DIAGNOSIS — L814 Other melanin hyperpigmentation: Secondary | ICD-10-CM | POA: Diagnosis not present

## 2019-05-13 DIAGNOSIS — L820 Inflamed seborrheic keratosis: Secondary | ICD-10-CM | POA: Diagnosis not present

## 2019-05-13 DIAGNOSIS — Z23 Encounter for immunization: Secondary | ICD-10-CM | POA: Diagnosis not present

## 2019-05-13 DIAGNOSIS — L821 Other seborrheic keratosis: Secondary | ICD-10-CM | POA: Diagnosis not present

## 2019-05-13 DIAGNOSIS — L570 Actinic keratosis: Secondary | ICD-10-CM | POA: Diagnosis not present

## 2019-05-13 DIAGNOSIS — D225 Melanocytic nevi of trunk: Secondary | ICD-10-CM | POA: Diagnosis not present

## 2019-05-13 DIAGNOSIS — D1800 Hemangioma unspecified site: Secondary | ICD-10-CM | POA: Diagnosis not present

## 2019-05-15 DIAGNOSIS — M542 Cervicalgia: Secondary | ICD-10-CM | POA: Diagnosis not present

## 2019-05-22 DIAGNOSIS — M542 Cervicalgia: Secondary | ICD-10-CM | POA: Diagnosis not present

## 2019-05-23 DIAGNOSIS — H9312 Tinnitus, left ear: Secondary | ICD-10-CM | POA: Diagnosis not present

## 2019-05-23 DIAGNOSIS — H905 Unspecified sensorineural hearing loss: Secondary | ICD-10-CM | POA: Diagnosis not present

## 2019-05-23 DIAGNOSIS — H9313 Tinnitus, bilateral: Secondary | ICD-10-CM | POA: Diagnosis not present

## 2019-05-23 DIAGNOSIS — H90A12 Conductive hearing loss, unilateral, left ear with restricted hearing on the contralateral side: Secondary | ICD-10-CM | POA: Diagnosis not present

## 2019-05-23 DIAGNOSIS — H90A21 Sensorineural hearing loss, unilateral, right ear, with restricted hearing on the contralateral side: Secondary | ICD-10-CM | POA: Diagnosis not present

## 2019-05-24 ENCOUNTER — Telehealth: Payer: Self-pay | Admitting: Family Medicine

## 2019-05-24 NOTE — Telephone Encounter (Signed)
Please advise 

## 2019-05-24 NOTE — Telephone Encounter (Signed)
Pt needs refill for venlafaxine XR (EFFEXOR-XR) 37.5 MG 24 hr capsule Pt would like this to be increased to 150mg     Pt would also like to increase her LORazepam (ATIVAN) 0.5 MG tablet To 1MG / please advise Pt wanted to schedule an appt with Dr. Charlett Blake if possible sooner than 12.24.20 about changing her medication and dosing due to her depression / Please advise

## 2019-05-24 NOTE — Telephone Encounter (Signed)
I can do an off hours virtual visit with her this coming week to get her meds adjusted

## 2019-05-28 ENCOUNTER — Ambulatory Visit: Payer: Self-pay | Admitting: *Deleted

## 2019-05-28 ENCOUNTER — Other Ambulatory Visit: Payer: Self-pay

## 2019-05-28 ENCOUNTER — Ambulatory Visit (INDEPENDENT_AMBULATORY_CARE_PROVIDER_SITE_OTHER): Payer: PPO | Admitting: Family Medicine

## 2019-05-28 DIAGNOSIS — M5030 Other cervical disc degeneration, unspecified cervical region: Secondary | ICD-10-CM

## 2019-05-28 DIAGNOSIS — R739 Hyperglycemia, unspecified: Secondary | ICD-10-CM | POA: Diagnosis not present

## 2019-05-28 DIAGNOSIS — G500 Trigeminal neuralgia: Secondary | ICD-10-CM

## 2019-05-28 DIAGNOSIS — E782 Mixed hyperlipidemia: Secondary | ICD-10-CM | POA: Diagnosis not present

## 2019-05-28 DIAGNOSIS — J4550 Severe persistent asthma, uncomplicated: Secondary | ICD-10-CM

## 2019-05-28 DIAGNOSIS — R519 Headache, unspecified: Secondary | ICD-10-CM

## 2019-05-28 MED ORDER — CARBAMAZEPINE ER 200 MG PO TB12: 200.0000 mg | ORAL_TABLET | Freq: Every day | ORAL | 0 refills | Status: DC

## 2019-05-28 MED ORDER — TIZANIDINE HCL 2 MG PO TABS: 1.0000 mg | ORAL_TABLET | Freq: Three times a day (TID) | ORAL | 0 refills | Status: DC | PRN

## 2019-05-28 NOTE — Telephone Encounter (Signed)
Patient seen PCP today  

## 2019-05-28 NOTE — Telephone Encounter (Signed)
Pt called stating that she is having numbness and tingling in her left. More tingling earlier and now more numbness. She is also having pain in her left eye socket. She is being treated for cervical spine management and was going for medical massage and told the massage therapist and decided that she should call her provider. She denies headache, blurred vision, unsteadiness, dizziness or change in speech.   per protocol, pt should be seen within 4 hours. LB at [facility] notified regarding an appointment. Call conference in with the practice.  Routing to the practice for review.  Reason for Disposition . [1] Numbness (i.e., loss of sensation) of the face, arm / hand, or leg / foot on one side of the body AND [2] gradual onset (e.g., days to weeks) AND [3] present now  Answer Assessment - Initial Assessment Questions 1. SYMPTOM: "What is the main symptom you are concerned about?" (e.g., weakness, numbness)     Numbness and tingling in the left cheek 2. ONSET: "When did this start?" (minutes, hours, days; while sleeping)     today 3. LAST NORMAL: "When was the last time you were normal (no symptoms)?"     Before today 4. PATTERN "Does this come and go, or has it been constant since it started?"  "Is it present now?"     contant 5. CARDIAC SYMPTOMS: "Have you had any of the following symptoms: chest pain, difficulty breathing, palpitations?"     no 6. NEUROLOGIC SYMPTOMS: "Have you had any of the following symptoms: headache, dizziness, vision loss, double vision, changes in speech, unsteady on your feet?"     no 7. OTHER SYMPTOMS: "Do you have any other symptoms?"     Pain in the left eye socket 8. PREGNANCY: "Is there any chance you are pregnant?" "When was your last menstrual period?"     n/a  Protocols used: NEUROLOGIC DEFICIT-A-AH

## 2019-05-29 ENCOUNTER — Other Ambulatory Visit: Payer: Self-pay | Admitting: Family Medicine

## 2019-05-29 ENCOUNTER — Encounter: Payer: Self-pay | Admitting: Family Medicine

## 2019-05-29 ENCOUNTER — Other Ambulatory Visit (INDEPENDENT_AMBULATORY_CARE_PROVIDER_SITE_OTHER): Payer: PPO

## 2019-05-29 ENCOUNTER — Telehealth: Payer: Self-pay | Admitting: Family Medicine

## 2019-05-29 DIAGNOSIS — R519 Headache, unspecified: Secondary | ICD-10-CM

## 2019-05-29 DIAGNOSIS — M542 Cervicalgia: Secondary | ICD-10-CM | POA: Diagnosis not present

## 2019-05-29 LAB — COMPREHENSIVE METABOLIC PANEL
ALT: 16 U/L (ref 0–35)
AST: 15 U/L (ref 0–37)
Albumin: 4.4 g/dL (ref 3.5–5.2)
Alkaline Phosphatase: 77 U/L (ref 39–117)
BUN: 13 mg/dL (ref 6–23)
CO2: 29 mEq/L (ref 19–32)
Calcium: 9.3 mg/dL (ref 8.4–10.5)
Chloride: 101 mEq/L (ref 96–112)
Creatinine, Ser: 0.62 mg/dL (ref 0.40–1.20)
GFR: 97.52 mL/min (ref >60.00)
Glucose, Bld: 93 mg/dL (ref 70–99)
Potassium: 3.8 mEq/L (ref 3.5–5.1)
Sodium: 137 mEq/L (ref 135–145)
Total Bilirubin: 0.4 mg/dL (ref 0.2–1.2)
Total Protein: 7 g/dL (ref 6.0–8.3)

## 2019-05-29 LAB — CBC WITH DIFFERENTIAL/PLATELET
Basophils Absolute: 0 10*3/uL (ref 0.0–0.1)
Basophils Relative: 0.6 % (ref 0.0–3.0)
Eosinophils Absolute: 0 10*3/uL (ref 0.0–0.7)
Eosinophils Relative: 0 % (ref 0.0–5.0)
HCT: 40.8 % (ref 36.0–46.0)
Hemoglobin: 13.5 g/dL (ref 12.0–15.0)
Lymphocytes Relative: 29.5 % (ref 12.0–46.0)
Lymphs Abs: 1.6 10*3/uL (ref 0.7–4.0)
MCHC: 33.2 g/dL (ref 30.0–36.0)
MCV: 90.9 fl (ref 78.0–100.0)
Monocytes Absolute: 0.4 10*3/uL (ref 0.1–1.0)
Monocytes Relative: 7.9 % (ref 3.0–12.0)
Neutro Abs: 3.4 10*3/uL (ref 1.4–7.7)
Neutrophils Relative %: 62 % (ref 43.0–77.0)
Platelets: 291 10*3/uL (ref 150.0–400.0)
RBC: 4.48 Mil/uL (ref 3.87–5.11)
RDW: 13.2 % (ref 11.5–15.5)
WBC: 5.4 10*3/uL (ref 4.0–10.5)

## 2019-05-29 LAB — SEDIMENTATION RATE: Sed Rate: 7 mm/hr (ref 0–30)

## 2019-05-29 LAB — C-REACTIVE PROTEIN: CRP: <1 mg/dL (ref 0.5–20.0)

## 2019-05-29 MED ORDER — ALPRAZOLAM 0.25 MG PO TABS: ORAL_TABLET | ORAL | 1 refills | Status: DC

## 2019-05-29 NOTE — Telephone Encounter (Signed)
Please advise 

## 2019-05-29 NOTE — Telephone Encounter (Signed)
I have refilled the alprazolam

## 2019-05-29 NOTE — Telephone Encounter (Signed)
Pt is haiving a CAT scan and a MRI of the neck, last time due to her claustrophobic issues Victoria [name] gave her ALPRAZolam Victoria [name]) 0.25 MG tablet  Would it be possible to do this for this Sat 12th again?  [place], [place] - [place] [phone] (Phone) [phone] (Fax)

## 2019-06-01 ENCOUNTER — Ambulatory Visit (HOSPITAL_BASED_OUTPATIENT_CLINIC_OR_DEPARTMENT_OTHER): Payer: PPO

## 2019-06-02 ENCOUNTER — Encounter: Payer: Self-pay | Admitting: Family Medicine

## 2019-06-03 ENCOUNTER — Other Ambulatory Visit: Payer: Self-pay | Admitting: Family Medicine

## 2019-06-03 ENCOUNTER — Encounter: Payer: Self-pay | Admitting: Family Medicine

## 2019-06-03 DIAGNOSIS — M5030 Other cervical disc degeneration, unspecified cervical region: Secondary | ICD-10-CM | POA: Insufficient documentation

## 2019-06-03 MED ORDER — VENLAFAXINE HCL ER 150 MG PO TB24: 150.0000 mg | ORAL_TABLET | Freq: Every day | ORAL | 1 refills | Status: DC

## 2019-06-03 NOTE — Progress Notes (Signed)
Virtual Visit via Video Note  I connected with Victoria [name] on 05/28/19 at  2:40 PM EST by a video enabled telemedicine application and verified that I am speaking with the correct person using two identifiers.  Location: Patient: home Provider: home   I discussed the limitations of evaluation and management by telemedicine and the availability of in person appointments. The patient expressed understanding and agreed to proceed. CMA was able to get the patient set up on a video visit.    Subjective:    Patient ID: Victoria [name], female    DOB: [birth year], 63 y.o.   MRN: [id number]  No chief complaint on file.   HPI Patient is in today for evaluation of worsening facial pain with persistent left neck pain, left shoulder pain and while the neck and shoulder pain are improving with physical therapy but the facial pain and a sense of tingling are noted on the left side of the face. No fevers or chills. No recent febrile illness and hospitalizations. Denies CP/palp/SOB/HA/congestion/fevers/GI or GU c/o. Taking meds as prescribed. No other neurologic notes.   Past Medical History:  Diagnosis Date  . 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 Anemia    iron deficiency, resolved  . Anxiety   . Asthma   . CAD (coronary artery disease)    a. LHC 6/16:  oOM1 60, pRCA 25 >> med Rx b. cath 3/19 2nd OM with 95% stenosis s/p synergy DES & anomalous RCA  . CAP (community acquired pneumonia) 2016; 06/07/2016  . Chronic bronchitis ([place])   . Chronic lower back pain   . Complication of anesthesia    "think I have a hard time waking up from it"  . COPD (chronic obstructive pulmonary disease) ([place])   . Depression    mild  . Diverticulosis   . GERD (gastroesophageal reflux disease)   . H/O hiatal hernia   . Headache    "weekly" (08/23/2017)  . History of echocardiogram    Echo 6/16:  Mod LVH, EF 60-65%, no RWMA, Gr 1 DD, trivial MR, normal LA  size.  Marland Kitchen Hyperglycemia 11/20/2015  . Hyperlipidemia, mixed 09/11/2007   Qualifier: Diagnosis of  By: Jerold Coombe   Did not tolerate Lipitor, zocor, Lovastatin, Pravastatin, Livalo, Crestor even low dose   . IBS (irritable bowel syndrome)   . Maxillary sinusitis   . Normal cardiac stress test 11/2011   No evidence of ischemia or infarct. Calculated ejection fraction 72%.  . Obesity   . OSA (obstructive sleep apnea) 02/2012   has stopped using  cpap  . Osteoarthritis   . Osteoporosis   . Pneumonia 11/2011   "before 2013 I hadn't had pneumonia since I was a child" (04/13/2012)  . Pulmonary nodules   . S/P angioplasty with stent 08/23/17 ostial 2nd OM with DES synnergy 08/24/2017  . Schatzki's ring     Past Surgical History:  Procedure Laterality Date  . APPENDECTOMY  1989  . CARDIAC CATHETERIZATION N/A 11/25/2014   Procedure: Right/Left Heart Cath and Coronary Angiography;  Surgeon: Belva Crome, MD;  Location: [facility];  Service: Cardiovascular;  Laterality: N/A;  . CESAREAN SECTION  1985  . CORONARY ANGIOPLASTY WITH STENT PLACEMENT  08/23/2017  . CORONARY STENT INTERVENTION N/A 08/23/2017   Procedure: CORONARY STENT INTERVENTION;  Surgeon: Burnell Blanks, MD;  Location: [facility];  Service: Cardiovascular;  Laterality: N/A;  . HERNIA REPAIR  04/13/2012  VHR laparoscopic  . LEFT HEART CATH AND CORONARY ANGIOGRAPHY N/A 08/23/2017   Procedure: LEFT HEART CATH AND CORONARY ANGIOGRAPHY;  Surgeon: Burnell Blanks, MD;  Location: [facility];  Service: Cardiovascular;  Laterality: N/A;  . VENTRAL HERNIA REPAIR  04/13/2012   Procedure: LAPAROSCOPIC VENTRAL HERNIA;  Surgeon: Adin Hector, MD;  Location: [facility];  Service: General;  Laterality: N/A;  laparoscopic repair of incarcerated hernia    Family History  Problem Relation Age of Onset  . Breast cancer Mother   . Hypertension Mother   . Diabetes Mother   . Diverticulosis Father   . Prostate  cancer Father   . Pulmonary embolism Brother        recurrent  . Heart attack Maternal Grandfather   . Breast cancer Sister   . Stroke Neg Hx     Social History   Socioeconomic History  . Marital status: Married    Spouse name: Not on file  . Number of children: 1  . Years of education: Not on file  . Highest education level: Not on file  Occupational History    Employer: UNEMPLOYED  Tobacco Use  . Smoking status: Never Smoker  . Smokeless tobacco: Never Used  Substance and Sexual Activity  . Alcohol use: Yes    Alcohol/week: 4.0 standard drinks    Types: 2 Glasses of wine, 2 Cans of beer per week  . Drug use: No  . Sexual activity: Yes    Comment: gluten free, lives with husband and son with CP quadriplegia  Other Topics Concern  . Not on file  Social History Narrative   Cares for a 99yo son with cerebral palsy.    Social Determinants of Health   Financial Resource Strain:   . Difficulty of Paying Living Expenses: Not on file  Food Insecurity:   . Worried About Charity fundraiser in the Last Year: Not on file  . Ran Out of Food in the Last Year: Not on file  Transportation Needs:   . Lack of Transportation (Medical): Not on file  . Lack of Transportation (Non-Medical): Not on file  Physical Activity:   . Days of Exercise per Week: Not on file  . Minutes of Exercise per Session: Not on file  Stress:   . Feeling of Stress : Not on file  Social Connections:   . Frequency of Communication with Friends and Family: Not on file  . Frequency of Social Gatherings with Friends and Family: Not on file  . Attends Religious Services: Not on file  . Active Member of Clubs or Organizations: Not on file  . Attends Archivist Meetings: Not on file  . Marital Status: Not on file  Intimate Partner Violence:   . Fear of Current or Ex-Partner: Not on file  . Emotionally Abused: Not on file  . Physically Abused: Not on file  . Sexually Abused: Not on file     Outpatient Medications Prior to Visit  Medication Sig Dispense Refill  . acyclovir ointment (ZOVIRAX) 5 % Apply 1 application topically every 3 (three) hours. 15 g 2  . alendronate (FOSAMAX) 70 MG tablet TAKE 1 TABLET BY MOUTH EVERY 7 DAYS. TAKE WITH A FULL GLASS OF WATER ON AN EMPTY STOMACH. 4 tablet 5  . aspirin 81 MG tablet Take 81 mg by mouth daily.     . Benralizumab (FASENRA) 30 MG/ML SOSY Inject 1 mL into the skin every 8 (eight) weeks. 1 mL 11  .  Bioflavonoid Products (ESTER C PO) Take 500 mg by mouth daily.    . Calcium-Magnesium-Vitamin D 300-150-400 MG-MG-UNIT TABS Take 1 tablet by mouth daily.    . Cholecalciferol (VITAMIN D) 2000 UNITS CAPS Take 2,000 Units by mouth daily.     . Evolocumab (REPATHA SURECLICK) XX123456 MG/ML SOAJ Inject 1 pen into the skin every 14 (fourteen) days. 6 pen 3  . LORazepam (ATIVAN) 0.5 MG tablet Take 0.5-1 tablets (0.25-0.5 mg total) by mouth at bedtime as needed for anxiety or sleep. 30 tablet 2  . Magnesium Oxide 250 MG TABS Take 250 mg by mouth daily.    . mupirocin ointment (BACTROBAN) 2 % Place 1 application into the nose 2 (two) times daily. 22 g 1  . nitroGLYCERIN (NITROSTAT) 0.4 MG SL tablet PLACE 1 TABLET UNDER THE TONGUE EVERY 5 MINUTES AS NEEDED FOR CHEST PAIN. 25 tablet 4  . venlafaxine XR (EFFEXOR-XR) 37.5 MG 24 hr capsule TAKE 1 CAPSULE BY MOUTH DAILY ALONG WITH 75 MG CAPSULE (112.5 MG TOTAL DAILY) 180 capsule 1  . venlafaxine XR (EFFEXOR-XR) 75 MG 24 hr capsule TAKE 1 CAPSULE BY MOUTH ONCE DAILY ALONG WITH 37.5 MG CAPSULE DAILY. (112.5 MG TOTAL DAILY) 180 capsule 1  . ALPRAZolam (XANAX) 0.25 MG tablet 1 tab po 30 minutes prior to imaging, repeat x 1 as needed 4 tablet 0   No facility-administered medications prior to visit.    Allergies  Allergen Reactions  . Beclomethasone Dipropionate Hives and Other (See Comments)     weight gain  . Mometasone Furo-Formoterol Fum Hives and Other (See Comments)    weight gain  . Sulfonamide  Derivatives Hives and Rash  . Statins     Myalgias, RLS    Review of Systems  Constitutional: Positive for malaise/fatigue. Negative for fever.  HENT: Positive for congestion.   Eyes: Negative for blurred vision.  Respiratory: Positive for cough. Negative for shortness of breath.   Cardiovascular: Negative for chest pain, palpitations and leg swelling.  Gastrointestinal: Negative for abdominal pain, blood in stool and nausea.  Genitourinary: Negative for dysuria and frequency.  Musculoskeletal: Negative for falls.  Skin: Negative for rash.  Neurological: Positive for headaches. Negative for dizziness and loss of consciousness.  Endo/Heme/Allergies: Negative for environmental allergies.  Psychiatric/Behavioral: Negative for depression. The patient is not nervous/anxious.        Objective:    Physical Exam Constitutional:      Appearance: Normal appearance. She is not ill-appearing.  HENT:     Head: Normocephalic and atraumatic.     Nose: Nose normal. No rhinorrhea.     Mouth/Throat:     Mouth: Mucous membranes are moist.  Eyes:     General: No scleral icterus.       Right eye: No discharge.        Left eye: No discharge.     Extraocular Movements: Extraocular movements intact.     Conjunctiva/sclera: Conjunctivae normal.     Pupils: Pupils are equal, round, and reactive to light.  Pulmonary:     Effort: Pulmonary effort is normal.  Neurological:     General: No focal deficit present.     Mental Status: She is alert and oriented to person, place, and time.     Cranial Nerves: No cranial nerve deficit.     Deep Tendon Reflexes: Reflexes normal.  Psychiatric:        Mood and Affect: Mood normal.        Behavior: Behavior normal.  There were no vitals taken for this visit. Wt Readings from Last 3 Encounters:  05/08/19 164 lb (74.4 kg)  03/19/19 161 lb 3.2 oz (73.1 kg)  03/15/19 159 lb (72.1 kg)    Diabetic Foot Exam - Simple   No data filed     Lab Results   Component Value Date   WBC 5.4 05/29/2019   HGB 13.5 05/29/2019   HCT 40.8 05/29/2019   PLT 291.0 05/29/2019   GLUCOSE 93 05/29/2019   CHOL 141 03/11/2019   TRIG 93.0 03/11/2019   HDL 56.10 03/11/2019   LDLDIRECT 177.8 09/07/2011   LDLCALC 66 03/11/2019   ALT 16 05/29/2019   AST 15 05/29/2019   NA 137 05/29/2019   K 3.8 05/29/2019   CL 101 05/29/2019   CREATININE 0.62 05/29/2019   BUN 13 05/29/2019   CO2 29 05/29/2019   TSH 2.24 03/11/2019   INR 1.0 08/21/2017   HGBA1C 5.7 03/11/2019    Lab Results  Component Value Date   TSH 2.24 03/11/2019   Lab Results  Component Value Date   WBC 5.4 05/29/2019   HGB 13.5 05/29/2019   HCT 40.8 05/29/2019   MCV 90.9 05/29/2019   PLT 291.0 05/29/2019   Lab Results  Component Value Date   NA 137 05/29/2019   K 3.8 05/29/2019   CO2 29 05/29/2019   GLUCOSE 93 05/29/2019   BUN 13 05/29/2019   CREATININE 0.62 05/29/2019   BILITOT 0.4 05/29/2019   ALKPHOS 77 05/29/2019   AST 15 05/29/2019   ALT 16 05/29/2019   PROT 7.0 05/29/2019   ALBUMIN 4.4 05/29/2019   CALCIUM 9.3 05/29/2019   ANIONGAP 6 08/24/2017   GFR 97.52 05/29/2019   Lab Results  Component Value Date   CHOL 141 03/11/2019   Lab Results  Component Value Date   HDL 56.10 03/11/2019   Lab Results  Component Value Date   LDLCALC 66 03/11/2019   Lab Results  Component Value Date   TRIG 93.0 03/11/2019   Lab Results  Component Value Date   CHOLHDL 3 03/11/2019   Lab Results  Component Value Date   HGBA1C 5.7 03/11/2019       Assessment & Plan:   Problem List Items Addressed This Visit    Hyperglycemia (Chronic)    hgba1c acceptable, minimize simple carbs. Increase exercise as tolerated.       Hyperlipidemia, mixed    Encouraged heart healthy diet, increase exercise, avoid trans fats, consider a krill oil cap daily      Severe persistent asthma with intensive monitoring    No recent exacerbation.       Facial pain - Primary    Unclear  etiology, proceed with CT sinuses and MR      Relevant Orders   CBC with Differential/Platelet (Completed)   CMP (Completed)   C-reactive protein (Completed)   SED RATE (Completed)   Other cervical disc degeneration, unspecified cervical region    Is getting some relief from physical therapy but pain persists. MR is ordered to further evaluate due to some new radicular symptoms      Relevant Medications   tiZANidine (ZANAFLEX) 2 MG tablet   Other Relevant Orders   MR Cervical Spine Wo Contrast    Other Visit Diagnoses    Trigeminal neuralgia       Relevant Medications   tiZANidine (ZANAFLEX) 2 MG tablet   carbamazepine (TEGRETOL-XR) 200 MG 12 hr tablet   Other Relevant Orders   CT  Maxillofacial WO CM      I am having Victoria [name] start on tiZANidine and carbamazepine. I am also having her maintain her Vitamin D, aspirin, Magnesium Oxide, Calcium-Magnesium-Vitamin D, acyclovir ointment, Benralizumab, Bioflavonoid Products (ESTER C PO), Evolocumab, nitroGLYCERIN, venlafaxine XR, venlafaxine XR, mupirocin ointment, LORazepam, and alendronate.  Meds ordered this encounter  Medications  . tiZANidine (ZANAFLEX) 2 MG tablet    Sig: Take 0.5-2 tablets (1-4 mg total) by mouth every 8 (eight) hours as needed for muscle spasms.    Dispense:  30 tablet    Refill:  0  . carbamazepine (TEGRETOL-XR) 200 MG 12 hr tablet    Sig: Take 1 tablet (200 mg total) by mouth at bedtime.    Dispense:  30 tablet    Refill:  0      I discussed the assessment and treatment plan with the patient. The patient was provided an opportunity to ask questions and all were answered. The patient agreed with the plan and demonstrated an understanding of the instructions.   The patient was advised to call back or seek an in-person evaluation if the symptoms worsen or if the condition fails to improve as anticipated.  I provided 25 minutes of non-face-to-face time during this encounter.   [name],  MD

## 2019-06-03 NOTE — Assessment & Plan Note (Signed)
hgba1c acceptable, minimize simple carbs. Increase exercise as tolerated.  

## 2019-06-03 NOTE — Assessment & Plan Note (Signed)
No recent exacerbation 

## 2019-06-03 NOTE — Assessment & Plan Note (Signed)
Unclear etiology, proceed with CT sinuses and MR

## 2019-06-03 NOTE — Telephone Encounter (Signed)
Please advise on medication. I do not see the 150mg  on her medication list  Please advise

## 2019-06-03 NOTE — Assessment & Plan Note (Signed)
Encouraged heart healthy diet, increase exercise, avoid trans fats, consider a krill oil cap daily 

## 2019-06-03 NOTE — Assessment & Plan Note (Signed)
Is getting some relief from physical therapy but pain persists. MR is ordered to further evaluate due to some new radicular symptoms

## 2019-06-05 ENCOUNTER — Other Ambulatory Visit: Payer: PPO

## 2019-06-06 ENCOUNTER — Ambulatory Visit (INDEPENDENT_AMBULATORY_CARE_PROVIDER_SITE_OTHER): Payer: PPO | Admitting: Family Medicine

## 2019-06-06 ENCOUNTER — Other Ambulatory Visit: Payer: Self-pay

## 2019-06-06 DIAGNOSIS — R739 Hyperglycemia, unspecified: Secondary | ICD-10-CM | POA: Diagnosis not present

## 2019-06-06 DIAGNOSIS — R519 Headache, unspecified: Secondary | ICD-10-CM

## 2019-06-06 DIAGNOSIS — J0191 Acute recurrent sinusitis, unspecified: Secondary | ICD-10-CM

## 2019-06-06 DIAGNOSIS — M542 Cervicalgia: Secondary | ICD-10-CM

## 2019-06-06 MED ORDER — CYCLOBENZAPRINE HCL 5 MG PO TABS: 2.5000 mg | ORAL_TABLET | Freq: Two times a day (BID) | ORAL | 1 refills | Status: DC | PRN

## 2019-06-08 ENCOUNTER — Other Ambulatory Visit: Payer: Self-pay

## 2019-06-08 ENCOUNTER — Ambulatory Visit (HOSPITAL_BASED_OUTPATIENT_CLINIC_OR_DEPARTMENT_OTHER)
Admission: RE | Admit: 2019-06-08 | Discharge: 2019-06-08 | Disposition: A | Discharge status: Home / Self Care | Payer: PPO | Source: Ambulatory Visit | Attending: Family Medicine | Admitting: Family Medicine

## 2019-06-08 DIAGNOSIS — M542 Cervicalgia: Secondary | ICD-10-CM | POA: Diagnosis not present

## 2019-06-08 DIAGNOSIS — G500 Trigeminal neuralgia: Secondary | ICD-10-CM

## 2019-06-08 DIAGNOSIS — M5030 Other cervical disc degeneration, unspecified cervical region: Secondary | ICD-10-CM

## 2019-06-09 NOTE — Assessment & Plan Note (Signed)
Her facial pain was much better on the tegretol.but she did not tolerate it so she stopped it and she continues to feel better. No changes

## 2019-06-09 NOTE — Assessment & Plan Note (Signed)
Treated with antibiotics. CT of sinuses negative

## 2019-06-09 NOTE — Progress Notes (Signed)
Virtual Visit via phone Note  I connected with Victoria [name] on 06/06/19 at  3:00 PM EST by a phone enabled telemedicine application and verified that I am speaking with the correct person using two identifiers.  Location: Patient: home Provider: office   I discussed the limitations of evaluation and management by telemedicine and the availability of in person appointments. The patient expressed understanding and agreed to proceed. CMA was able to get the patient set up on a a phone visit after being unable to set up a video visit.    Subjective:    Patient ID: Victoria [name], female    DOB: [birth year], 63 y.o.   MRN: [id number]  Chief Complaint  Patient presents with  . Follow-up    HPI Patient is in today for follow up on chronic neck pain, left shoulder pain and some recent trouble with left sided headaches and sinusitis. She is feeling much better today. The Tegretol was helpful but had too many side effects so she stopped but she still feels better. No fevers. She had some congestion but it is improved. No fevers or chills. No other neurologic complaints. No vision or hearing changes. Denies CP/palp/SOB/HA/fevers/GI or GU c/o. Taking meds as prescribed  Past Medical History:  Diagnosis Date  . Allergic bronchopulmonary aspergillosis ([place]) 2008   sees Dr Edmund Hilda pulmonology  . Anemia    iron deficiency, resolved  . Anxiety   . Asthma   . CAD (coronary artery disease)    a. LHC 6/16:  oOM1 60, pRCA 25 >> med Rx b. cath 3/19 2nd OM with 95% stenosis s/p synergy DES & anomalous RCA  . CAP (community acquired pneumonia) 2016; 06/07/2016  . Chronic bronchitis ([place])   . Chronic lower back pain   . Complication of anesthesia    "think I have a hard time waking up from it"  . COPD (chronic obstructive pulmonary disease) ([place])   . Depression    mild  . Diverticulosis   . GERD (gastroesophageal reflux disease)   . H/O hiatal hernia   . Headache    "weekly" (08/23/2017)   . History of echocardiogram    Echo 6/16:  Mod LVH, EF 60-65%, no RWMA, Gr 1 DD, trivial MR, normal LA size.  Marland Kitchen Hyperglycemia 11/20/2015  . Hyperlipidemia, mixed 09/11/2007   Qualifier: Diagnosis of  By: Jerold Coombe   Did not tolerate Lipitor, zocor, Lovastatin, Pravastatin, Livalo, Crestor even low dose   . IBS (irritable bowel syndrome)   . Maxillary sinusitis   . Normal cardiac stress test 11/2011   No evidence of ischemia or infarct. Calculated ejection fraction 72%.  . Obesity   . OSA (obstructive sleep apnea) 02/2012   has stopped using  cpap  . Osteoarthritis   . Osteoporosis   . Pneumonia 11/2011   "before 2013 I hadn't had pneumonia since I was a child" (04/13/2012)  . Pulmonary nodules   . S/P angioplasty with stent 08/23/17 ostial 2nd OM with DES synnergy 08/24/2017  . Schatzki's ring     Past Surgical History:  Procedure Laterality Date  . APPENDECTOMY  1989  . CARDIAC CATHETERIZATION N/A 11/25/2014   Procedure: Right/Left Heart Cath and Coronary Angiography;  Surgeon: Belva Crome, MD;  Location: [facility];  Service: Cardiovascular;  Laterality: N/A;  . CESAREAN SECTION  1985  . CORONARY ANGIOPLASTY WITH STENT PLACEMENT  08/23/2017  . CORONARY STENT INTERVENTION N/A 08/23/2017   Procedure: CORONARY STENT INTERVENTION;  Surgeon: Burnell Blanks, MD;  Location: [facility];  Service: Cardiovascular;  Laterality: N/A;  . HERNIA REPAIR  04/13/2012   VHR laparoscopic  . LEFT HEART CATH AND CORONARY ANGIOGRAPHY N/A 08/23/2017   Procedure: LEFT HEART CATH AND CORONARY ANGIOGRAPHY;  Surgeon: Burnell Blanks, MD;  Location: [facility];  Service: Cardiovascular;  Laterality: N/A;  . VENTRAL HERNIA REPAIR  04/13/2012   Procedure: LAPAROSCOPIC VENTRAL HERNIA;  Surgeon: Adin Hector, MD;  Location: [facility];  Service: General;  Laterality: N/A;  laparoscopic repair of incarcerated hernia    Family History  Problem Relation Age of Onset  .  Breast cancer Mother   . Hypertension Mother   . Diabetes Mother   . Diverticulosis Father   . Prostate cancer Father   . Pulmonary embolism Brother        recurrent  . Heart attack Maternal Grandfather   . Breast cancer Sister   . Stroke Neg Hx     Social History   Socioeconomic History  . Marital status: Married    Spouse name: Not on file  . Number of children: 1  . Years of education: Not on file  . Highest education level: Not on file  Occupational History    Employer: UNEMPLOYED  Tobacco Use  . Smoking status: Never Smoker  . Smokeless tobacco: Never Used  Substance and Sexual Activity  . Alcohol use: Yes    Alcohol/week: 4.0 standard drinks    Types: 2 Glasses of wine, 2 Cans of beer per week  . Drug use: No  . Sexual activity: Yes    Comment: gluten free, lives with husband and son with CP quadriplegia  Other Topics Concern  . Not on file  Social History Narrative   Cares for a 66yo son with cerebral palsy.    Social Determinants of Health   Financial Resource Strain:   . Difficulty of Paying Living Expenses: Not on file  Food Insecurity:   . Worried About Charity fundraiser in the Last Year: Not on file  . Ran Out of Food in the Last Year: Not on file  Transportation Needs:   . Lack of Transportation (Medical): Not on file  . Lack of Transportation (Non-Medical): Not on file  Physical Activity:   . Days of Exercise per Week: Not on file  . Minutes of Exercise per Session: Not on file  Stress:   . Feeling of Stress : Not on file  Social Connections:   . Frequency of Communication with Friends and Family: Not on file  . Frequency of Social Gatherings with Friends and Family: Not on file  . Attends Religious Services: Not on file  . Active Member of Clubs or Organizations: Not on file  . Attends Archivist Meetings: Not on file  . Marital Status: Not on file  Intimate Partner Violence:   . Fear of Current or Ex-Partner: Not on file  .  Emotionally Abused: Not on file  . Physically Abused: Not on file  . Sexually Abused: Not on file    Outpatient Medications Prior to Visit  Medication Sig Dispense Refill  . acyclovir ointment (ZOVIRAX) 5 % Apply 1 application topically every 3 (three) hours. 15 g 2  . alendronate (FOSAMAX) 70 MG tablet TAKE 1 TABLET BY MOUTH EVERY 7 DAYS. TAKE WITH A FULL GLASS OF WATER ON AN EMPTY STOMACH. 4 tablet 5  . ALPRAZolam (XANAX) 0.25 MG tablet 1 tab po  30 minutes prior to imaging, repeat x 1 as needed 4 tablet 1  . aspirin 81 MG tablet Take 81 mg by mouth daily.     . Benralizumab (FASENRA) 30 MG/ML SOSY Inject 1 mL into the skin every 8 (eight) weeks. 1 mL 11  . Bioflavonoid Products (ESTER C PO) Take 500 mg by mouth daily.    . Calcium-Magnesium-Vitamin D 300-150-400 MG-MG-UNIT TABS Take 1 tablet by mouth daily.    . Cholecalciferol (VITAMIN D) 2000 UNITS CAPS Take 2,000 Units by mouth daily.     . Evolocumab (REPATHA SURECLICK) XX123456 MG/ML SOAJ Inject 1 pen into the skin every 14 (fourteen) days. 6 pen 3  . LORazepam (ATIVAN) 0.5 MG tablet Take 0.5-1 tablets (0.25-0.5 mg total) by mouth at bedtime as needed for anxiety or sleep. 30 tablet 2  . Magnesium Oxide 250 MG TABS Take 250 mg by mouth daily.    . mupirocin ointment (BACTROBAN) 2 % Place 1 application into the nose 2 (two) times daily. 22 g 1  . nitroGLYCERIN (NITROSTAT) 0.4 MG SL tablet PLACE 1 TABLET UNDER THE TONGUE EVERY 5 MINUTES AS NEEDED FOR CHEST PAIN. 25 tablet 4  . Venlafaxine HCl 150 MG TB24 Take 1 tablet (150 mg total) by mouth daily. 90 tablet 1  . carbamazepine (TEGRETOL-XR) 200 MG 12 hr tablet Take 1 tablet (200 mg total) by mouth at bedtime. 30 tablet 0  . tiZANidine (ZANAFLEX) 2 MG tablet Take 0.5-2 tablets (1-4 mg total) by mouth every 8 (eight) hours as needed for muscle spasms. 30 tablet 0   No facility-administered medications prior to visit.    Allergies  Allergen Reactions  . Beclomethasone Dipropionate Hives and  Other (See Comments)     weight gain  . Mometasone Furo-Formoterol Fum Hives and Other (See Comments)    weight gain  . Sulfonamide Derivatives Hives and Rash  . Statins     Myalgias, RLS    Review of Systems  Constitutional: Negative for fever and malaise/fatigue.  HENT: Positive for congestion.   Eyes: Negative for blurred vision.  Respiratory: Negative for shortness of breath.   Cardiovascular: Negative for chest pain, palpitations and leg swelling.  Gastrointestinal: Negative for abdominal pain, blood in stool and nausea.  Genitourinary: Negative for dysuria and frequency.  Musculoskeletal: Positive for joint pain, myalgias and neck pain. Negative for falls.  Skin: Negative for rash.  Neurological: Positive for headaches. Negative for dizziness and loss of consciousness.  Endo/Heme/Allergies: Negative for environmental allergies.  Psychiatric/Behavioral: Negative for depression. The patient is not nervous/anxious.        Objective:    Physical Exam unable to obtain via phone visit  Ht 5' 1.75" (1.568 m)   Wt 160 lb (72.6 kg)   BMI 29.50 kg/m  Wt Readings from Last 3 Encounters:  06/06/19 160 lb (72.6 kg)  05/08/19 164 lb (74.4 kg)  03/19/19 161 lb 3.2 oz (73.1 kg)    Diabetic Foot Exam - Simple   No data filed     Lab Results  Component Value Date   WBC 5.4 05/29/2019   HGB 13.5 05/29/2019   HCT 40.8 05/29/2019   PLT 291.0 05/29/2019   GLUCOSE 93 05/29/2019   CHOL 141 03/11/2019   TRIG 93.0 03/11/2019   HDL 56.10 03/11/2019   LDLDIRECT 177.8 09/07/2011   LDLCALC 66 03/11/2019   ALT 16 05/29/2019   AST 15 05/29/2019   NA 137 05/29/2019   K 3.8 05/29/2019   CL 101  05/29/2019   CREATININE 0.62 05/29/2019   BUN 13 05/29/2019   CO2 29 05/29/2019   TSH 2.24 03/11/2019   INR 1.0 08/21/2017   HGBA1C 5.7 03/11/2019    Lab Results  Component Value Date   TSH 2.24 03/11/2019   Lab Results  Component Value Date   WBC 5.4 05/29/2019   HGB 13.5  05/29/2019   HCT 40.8 05/29/2019   MCV 90.9 05/29/2019   PLT 291.0 05/29/2019   Lab Results  Component Value Date   NA 137 05/29/2019   K 3.8 05/29/2019   CO2 29 05/29/2019   GLUCOSE 93 05/29/2019   BUN 13 05/29/2019   CREATININE 0.62 05/29/2019   BILITOT 0.4 05/29/2019   ALKPHOS 77 05/29/2019   AST 15 05/29/2019   ALT 16 05/29/2019   PROT 7.0 05/29/2019   ALBUMIN 4.4 05/29/2019   CALCIUM 9.3 05/29/2019   ANIONGAP 6 08/24/2017   GFR 97.52 05/29/2019   Lab Results  Component Value Date   CHOL 141 03/11/2019   Lab Results  Component Value Date   HDL 56.10 03/11/2019   Lab Results  Component Value Date   LDLCALC 66 03/11/2019   Lab Results  Component Value Date   TRIG 93.0 03/11/2019   Lab Results  Component Value Date   CHOLHDL 3 03/11/2019   Lab Results  Component Value Date   HGBA1C 5.7 03/11/2019       Assessment & Plan:   Problem List Items Addressed This Visit    Hyperglycemia (Chronic)    hgba1c acceptable, minimize simple carbs. Increase exercise as tolerated.       Acute recurrent sinusitis    Treated with antibiotics. CT of sinuses negative       Neck pain    MRI shows arthritic changes but no acute concerns. Can continue with chiropractic and massage. Consider ortho or neurosurg referral if worsens      Headache    Her facial pain was much better on the tegretol.but she did not tolerate it so she stopped it and she continues to feel better. No changes      Relevant Medications   cyclobenzaprine (FLEXERIL) 5 MG tablet      I have discontinued Tenna Child. Deeds's tiZANidine and carbamazepine. I am also having her start on cyclobenzaprine. Additionally, I am having her maintain her Vitamin D, aspirin, Magnesium Oxide, Calcium-Magnesium-Vitamin D, acyclovir ointment, Benralizumab, Bioflavonoid Products (ESTER C PO), Evolocumab, nitroGLYCERIN, mupirocin ointment, LORazepam, alendronate, ALPRAZolam, and Venlafaxine HCl.  Meds ordered this  encounter  Medications  . cyclobenzaprine (FLEXERIL) 5 MG tablet    Sig: Take 0.5-2 tablets (2.5-10 mg total) by mouth 2 (two) times daily as needed for muscle spasms.    Dispense:  30 tablet    Refill:  1    I discussed the assessment and treatment plan with the patient. The patient was provided an opportunity to ask questions and all were answered. The patient agreed with the plan and demonstrated an understanding of the instructions.   The patient was advised to call back or seek an in-person evaluation if the symptoms worsen or if the condition fails to improve as anticipated.  I provided 25 minutes of non-face-to-face time during this encounter.   [name], MD

## 2019-06-09 NOTE — Assessment & Plan Note (Signed)
MRI shows arthritic changes but no acute concerns. Can continue with chiropractic and massage. Consider ortho or neurosurg referral if worsens

## 2019-06-09 NOTE — Assessment & Plan Note (Signed)
hgba1c acceptable, minimize simple carbs. Increase exercise as tolerated.  

## 2019-06-18 ENCOUNTER — Encounter: Payer: Self-pay | Admitting: Family Medicine

## 2019-06-19 DIAGNOSIS — G720 Drug-induced myopathy: Secondary | ICD-10-CM | POA: Insufficient documentation

## 2019-06-19 DIAGNOSIS — T466X5A Adverse effect of antihyperlipidemic and antiarteriosclerotic drugs, initial encounter: Secondary | ICD-10-CM | POA: Insufficient documentation

## 2019-06-20 ENCOUNTER — Ambulatory Visit: Payer: PPO | Admitting: Family Medicine

## 2019-06-27 ENCOUNTER — Encounter: Payer: Self-pay | Admitting: Neurology

## 2019-06-27 ENCOUNTER — Telehealth: Payer: Self-pay | Admitting: Internal Medicine

## 2019-06-27 ENCOUNTER — Ambulatory Visit: Payer: PPO | Admitting: Neurology

## 2019-06-27 ENCOUNTER — Other Ambulatory Visit: Payer: Self-pay

## 2019-06-27 VITALS — BP 129/76 | HR 89 | Temp 98.0°F | Ht 62.0 in | Wt 162.0 lb

## 2019-06-27 DIAGNOSIS — R519 Headache, unspecified: Secondary | ICD-10-CM

## 2019-06-27 DIAGNOSIS — H9313 Tinnitus, bilateral: Secondary | ICD-10-CM | POA: Diagnosis not present

## 2019-06-27 DIAGNOSIS — M542 Cervicalgia: Secondary | ICD-10-CM | POA: Diagnosis not present

## 2019-06-27 NOTE — Progress Notes (Signed)
64 year old female, complains of persistent left occipital area pain since overnight sleep on December 08, 2018.  She has moderate tenderness of left nuchal area upon deep palpitation, rest of the neurological examination was normal.  I performed trigger point injection today, 0.5% Marcaine, 1.5 cc, mixed with 1.5 cc of betamethasone (30 mg/ 5 cc) Injection was performed along left nuchal line, multiple injection site was performed, patient tolerated the injection well

## 2019-06-27 NOTE — Progress Notes (Signed)
PATIENT: Victoria [name] DOB: [birth year]  Chief Complaint  Patient presents with  . Headache    Reports daily headaches to varying degrees. She is also concerned about intermittent buzzing sounds in her head and ringing in her left ear.  This has been an ongoing issue since June 2020.  She has recently underwent MRI scans of brain and cervical spine.  She is currently going to PT and having massages for cervical pain.  She has difficutly falling asleep that she alternates between lorazepam and melatonin.  Marland Kitchen PCP    Mosie Lukes, MD     HISTORICAL  Victoria [name] is a 64 year old female, seen in request by primary care physician Dr. Willette Alma for evaluation of left occipital area headaches, tinnitus, initial evaluation was on June 27, 2019.  I have reviewed and summarized the referring note from the referring physician.  She had a history of chronic insomnia, mood disorder, taking Effexor xr 150 mg every morning, hyperlipidemia, severe allergy, which has much improved with recent treatment of Benralizumab every 8 weeks.  She had a history of occasionally headache when she was young.  But has not had recurrent headache for a while.  On December 08, 2018, after overnight sleep, she woke up with left occipital area pain, radiating pain towards left parietal, temporal region, which has been persistent since then, over the last few months, she has tried massage therapy, physical therapy, heating pad, with some help, the pain is no longer severe, but she still feels tenderness at left occipital region  In addition, she complains of constant amplified bilateral ear tinnitus, was seen by ENT, no significant pathology found  I personally reviewed MRI of the brain with without contrast in October 2020 that was normal, CT of sinus was normal,  MRI of cervical spine in December 2020, mild degenerative disease, there was no significant foraminal canal stenosis   REVIEW OF SYSTEMS: Full 14 system  review of systems performed and notable only for as above All other review of systems were negative.  ALLERGIES: Allergies  Allergen Reactions  . Beclomethasone Dipropionate Hives and Other (See Comments)     weight gain  . Mometasone Furo-Formoterol Fum Hives and Other (See Comments)    weight gain  . Sulfonamide Derivatives Hives and Rash  . Statins     Myalgias, RLS    HOME MEDICATIONS: Current Outpatient Medications  Medication Sig Dispense Refill  . acyclovir ointment (ZOVIRAX) 5 % Apply 1 application topically every 3 (three) hours. 15 g 2  . alendronate (FOSAMAX) 70 MG tablet TAKE 1 TABLET BY MOUTH EVERY 7 DAYS. TAKE WITH A FULL GLASS OF WATER ON AN EMPTY STOMACH. 4 tablet 5  . aspirin 81 MG tablet Take 81 mg by mouth daily.     . Benralizumab (FASENRA) 30 MG/ML SOSY Inject 1 mL into the skin every 8 (eight) weeks. 1 mL 11  . Bioflavonoid Products (ESTER C PO) Take 500 mg by mouth daily.    . Calcium-Magnesium-Vitamin D 300-150-400 MG-MG-UNIT TABS Take 1 tablet by mouth daily.    . cyclobenzaprine (FLEXERIL) 5 MG tablet Take 0.5-2 tablets (2.5-10 mg total) by mouth 2 (two) times daily as needed for muscle spasms. 30 tablet 1  . Evolocumab (REPATHA SURECLICK) [id number] MG/ML SOAJ Inject 1 pen into the skin every 14 (fourteen) days. 6 pen 3  . LORazepam (ATIVAN) 0.5 MG tablet Take 0.5-1 tablets (0.25-0.5 mg total) by mouth at bedtime as needed for anxiety or  sleep. 30 tablet 2  . MAGNESIUM CITRATE PO Take 300 mg by mouth daily.    . Melatonin 3 MG CAPS Take 3 mg by mouth at bedtime as needed.     . mupirocin ointment (BACTROBAN) 2 % Place 1 application into the nose 2 (two) times daily. 22 g 1  . nitroGLYCERIN (NITROSTAT) 0.4 MG SL tablet PLACE 1 TABLET UNDER THE TONGUE EVERY 5 MINUTES AS NEEDED FOR CHEST PAIN. 25 tablet 4  . Venlafaxine HCl 150 MG TB24 Take 1 tablet (150 mg total) by mouth daily. 90 tablet 1  . VITAMIN D PO Take 5,000 Units by mouth daily.     No current  facility-administered medications for this visit.    PAST MEDICAL HISTORY: Past Medical History:  Diagnosis Date  . 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 Anemia    iron deficiency, resolved  . Anxiety   . Asthma   . CAD (coronary artery disease)    a. LHC 6/16:  oOM1 60, pRCA 25 >> med Rx b. cath 3/19 2nd OM with 95% stenosis s/p synergy DES & anomalous RCA  . CAP (community acquired pneumonia) 2016; 06/07/2016  . Chronic bronchitis ([place])   . Chronic lower back pain   . Complication of anesthesia    "think I have a hard time waking up from it"  . COPD (chronic obstructive pulmonary disease) ([place])   . Depression    mild  . Diverticulosis   . GERD (gastroesophageal reflux disease)   . H/O hiatal hernia   . Headache    "weekly" (08/23/2017)  . Headache   . History of echocardiogram    Echo 6/16:  Mod LVH, EF 60-65%, no RWMA, Gr 1 DD, trivial MR, normal LA size.  Marland Kitchen Hyperglycemia 11/20/2015  . Hyperlipidemia, mixed 09/11/2007   Qualifier: Diagnosis of  By: Jerold Coombe   Did not tolerate Lipitor, zocor, Lovastatin, Pravastatin, Livalo, Crestor even low dose   . IBS (irritable bowel syndrome)   . Maxillary sinusitis   . Normal cardiac stress test 11/2011   No evidence of ischemia or infarct. Calculated ejection fraction 72%.  . Obesity   . OSA (obstructive sleep apnea) 02/2012   has stopped using  cpap  . Osteoarthritis   . Osteoporosis   . Pneumonia 11/2011   "before 2013 I hadn't had pneumonia since I was a child" (04/13/2012)  . Pulmonary nodules   . S/P angioplasty with stent 08/23/17 ostial 2nd OM with DES synnergy 08/24/2017  . Schatzki's ring     PAST SURGICAL HISTORY: Past Surgical History:  Procedure Laterality Date  . APPENDECTOMY  1989  . CARDIAC CATHETERIZATION N/A 11/25/2014   Procedure: Right/Left Heart Cath and Coronary Angiography;  Surgeon: Belva Crome, MD;  Location: [facility];  Service:  Cardiovascular;  Laterality: N/A;  . CESAREAN SECTION  1985  . CORONARY ANGIOPLASTY WITH STENT PLACEMENT  08/23/2017  . CORONARY STENT INTERVENTION N/A 08/23/2017   Procedure: CORONARY STENT INTERVENTION;  Surgeon: Burnell Blanks, MD;  Location: [facility];  Service: Cardiovascular;  Laterality: N/A;  . HERNIA REPAIR  04/13/2012   VHR laparoscopic  . LEFT HEART CATH AND CORONARY ANGIOGRAPHY N/A 08/23/2017   Procedure: LEFT HEART CATH AND CORONARY ANGIOGRAPHY;  Surgeon: Burnell Blanks, MD;  Location: [facility];  Service: Cardiovascular;  Laterality: N/A;  . VENTRAL HERNIA REPAIR  04/13/2012   Procedure: LAPAROSCOPIC VENTRAL HERNIA;  Surgeon: Adin Hector, MD;  Location: [facility];  Service: General;  Laterality: N/A;  laparoscopic repair of incarcerated hernia    FAMILY HISTORY: Family History  Problem Relation Age of Onset  . Breast cancer Mother   . Hypertension Mother   . Diabetes Mother   . Diverticulosis Father   . Prostate cancer Father   . Pulmonary embolism Brother        recurrent  . Heart attack Maternal Grandfather   . Breast cancer Sister   . Stroke Neg Hx     SOCIAL HISTORY: Social History   Socioeconomic History  . Marital status: Married    Spouse name: Not on file  . Number of children: 1  . Years of education: 64  . Highest education level: Associate degree: academic program  Occupational History  . Occupation: Disabled   Tobacco Use  . Smoking status: Never Smoker  . Smokeless tobacco: Never Used  Substance and Sexual Activity  . Alcohol use: Yes    Comment: social use  . Drug use: No  . Sexual activity: Yes    Comment: gluten free, lives with husband and son with CP quadriplegia  Other Topics Concern  . 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    Lives at home with her husband and son.   Right-handed.   2 cups caffeine per day.   Social Determinants of Health   Financial  Resource Strain:   . Difficulty of Paying Living Expenses: Not on file  Food Insecurity:   . Worried About Charity fundraiser in the Last Year: Not on file  . Ran Out of Food in the Last Year: Not on file  Transportation Needs:   . Lack of Transportation (Medical): Not on file  . Lack of Transportation (Non-Medical): Not on file  Physical Activity:   . Days of Exercise per Week: Not on file  . Minutes of Exercise per Session: Not on file  Stress:   . Feeling of Stress : Not on file  Social Connections:   . Frequency of Communication with Friends and Family: Not on file  . Frequency of Social Gatherings with Friends and Family: Not on file  . Attends Religious Services: Not on file  . Active Member of Clubs or Organizations: Not on file  . Attends Archivist Meetings: Not on file  . Marital Status: Not on file  Intimate Partner Violence:   . Fear of Current or Ex-Partner: Not on file  . Emotionally Abused: Not on file  . Physically Abused: Not on file  . Sexually Abused: Not on file     PHYSICAL EXAM   Vitals:   06/27/19 1051  BP: 129/76  Pulse: 89  Temp: 98 F (36.7 C)  Weight: 162 lb (73.5 kg)  Height: 5\' 2"  (1.575 m)    Not recorded      Body mass index is 29.63 kg/m.  PHYSICAL EXAMNIATION:  Gen: NAD, conversant, well nourised, well groomed                     Cardiovascular: Regular rate rhythm, no peripheral edema, warm, nontender. Eyes: Conjunctivae clear without exudates or hemorrhage Neck: Supple, no carotid bruits.  Tenderness along left nuchal area Pulmonary: Clear to auscultation bilaterally   NEUROLOGICAL EXAM:  MENTAL STATUS: Speech:    Speech is normal; fluent and spontaneous with normal comprehension.  Cognition:     Orientation  to time, place and person     Normal recent and remote memory     Normal Attention span and concentration     Normal Language, naming, repeating,spontaneous speech     Fund of knowledge   CRANIAL  NERVES: CN II: Visual fields are full to confrontation. Pupils are round equal and briskly reactive to light. CN III, IV, VI: extraocular movement are normal. No ptosis. CN V: Facial sensation is intact to light touch CN VII: Face is symmetric with normal eye closure  CN VIII: Hearing is normal to causal conversation. CN IX, X: Phonation is normal. CN XI: Head turning and shoulder shrug are intact  MOTOR: There is no pronator drift of out-stretched arms. Muscle bulk and tone are normal. Muscle strength is normal.  REFLEXES: Reflexes are 2+ and symmetric at the biceps, triceps, knees, and ankles. Plantar responses are flexor.  SENSORY: Intact to light touch, pinprick and vibratory sensation are intact in fingers and toes.  COORDINATION: There is no trunk or limb dysmetria noted.  GAIT/STANCE: Posture is normal. Gait is steady with normal steps, base, arm swing, and turning. Heel and toe walking are normal. Tandem gait is normal.  Romberg is absent.   DIAGNOSTIC DATA (LABS, IMAGING, TESTING) - I reviewed patient records, labs, notes, testing and imaging myself where available.   ASSESSMENT AND PLAN  Victoria [name] is a 64 y.o. female   Left occipital area pain, radiating pain  Most suggestive of left occipital neuralgia versus left occipital area myofascial pain  Performed trigger point injection today,  Also advised patient heating pad, neck massage, as needed NSAIDs, neck stretching exercise  Only return to clinic for new issues   Marcial Pacas, M.D. Ph.D.  [facility] [address], [place], [place] [address] Ph: [phone] Fax: [phone]  CC: Referring Provider

## 2019-07-01 DIAGNOSIS — M542 Cervicalgia: Secondary | ICD-10-CM | POA: Diagnosis not present

## 2019-07-02 NOTE — Telephone Encounter (Signed)
Called [employer] to set up fasenra shipment.  Rep stated patient enrollment for Patient assistance expired 06/20/19.  Patient needs to contact [employer] at [phone] or go online to [email or web address] for medicare re enrollment for 2021. Called and spoke with Patient.  Understanding stated. Patient stated she would call [employer] for 2021 enrollment. Will follow up.

## 2019-07-04 ENCOUNTER — Other Ambulatory Visit: Payer: Self-pay | Admitting: Family Medicine

## 2019-07-04 NOTE — Telephone Encounter (Signed)
Fax received today from [employer] stating patient has been approved for prescription savings program for 1 year.  Patient will be notified when assistance is near end for re enrollment.

## 2019-07-04 NOTE — Telephone Encounter (Signed)
Requesting:ativan  Contract:yes UDS:n/a Last OV:06/06/19 Next OV:n/a Last Refill:12/28/18 #[id number] Database:   Please advise

## 2019-07-05 MED ORDER — FASENRA 30 MG/ML ~~LOC~~ SOSY: 1.0000 mL | PREFILLED_SYRINGE | SUBCUTANEOUS | 11 refills | Status: DC

## 2019-07-05 NOTE — Telephone Encounter (Signed)
Called [employer] to set up fasenra shipment. New fasenra prescription needed for 2021.  Per Donah Driver, [employer] rep, prescription can be sent electronically through [facility] [place] office. Varney Baas prescription sent.  Will follow up next week to schedule shipment.

## 2019-07-09 DIAGNOSIS — M542 Cervicalgia: Secondary | ICD-10-CM | POA: Diagnosis not present

## 2019-07-11 ENCOUNTER — Encounter: Payer: Self-pay | Admitting: Family Medicine

## 2019-07-11 DIAGNOSIS — M542 Cervicalgia: Secondary | ICD-10-CM | POA: Diagnosis not present

## 2019-07-12 ENCOUNTER — Telehealth: Payer: Self-pay | Admitting: Internal Medicine

## 2019-07-12 NOTE — Telephone Encounter (Signed)
Called [employer] to set up delivery for fasenra.  [name] order placed for deliver in 1-2 business days. Patient aware and scheduled 07/19/19 at 1100.

## 2019-07-12 NOTE — Telephone Encounter (Signed)
Patient aware and scheduled 07/19/19 at 1100.

## 2019-07-12 NOTE — Telephone Encounter (Signed)
[name] Order: 30mg  #1 prefilled syringe Ordered date: 07/12/19 Expected date of arrival: 1-2 business days (1/25 or 1/26) Ordered by: [place]: [employer]

## 2019-07-15 DIAGNOSIS — M542 Cervicalgia: Secondary | ICD-10-CM | POA: Diagnosis not present

## 2019-07-16 NOTE — Telephone Encounter (Signed)
Fasenra Shipment Received:  30mg  #1 prefilled syringe Medication arrival date: 07/16/2019 Lot #: [id number] Exp date: 08/2020 Received by: Desmond Dike, [place]

## 2019-07-19 ENCOUNTER — Other Ambulatory Visit: Payer: Self-pay

## 2019-07-19 ENCOUNTER — Ambulatory Visit (INDEPENDENT_AMBULATORY_CARE_PROVIDER_SITE_OTHER): Payer: PPO

## 2019-07-19 DIAGNOSIS — J4550 Severe persistent asthma, uncomplicated: Secondary | ICD-10-CM

## 2019-07-19 MED ORDER — BENRALIZUMAB 30 MG/ML ~~LOC~~ SOSY
30.0000 mg | PREFILLED_SYRINGE | Freq: Once | SUBCUTANEOUS | Status: AC
  Administered 2019-07-19: 30 mg via SUBCUTANEOUS

## 2019-07-19 NOTE — Progress Notes (Signed)
All questions were answered by the patient before medication was administered. Have you been hospitalized in the last 10 days? No Do you have a fever? No Do you have a cough? No Do you have a headache or sore throat? No  

## 2019-07-20 ENCOUNTER — Encounter: Payer: Self-pay | Admitting: Podiatry

## 2019-08-06 ENCOUNTER — Encounter: Payer: Self-pay | Admitting: Family Medicine

## 2019-08-06 NOTE — Telephone Encounter (Signed)
MR please advise. Thanks! 

## 2019-08-07 NOTE — Telephone Encounter (Signed)
I d/w Tammy Parrett - > vaccines are run out of state and we are unable to channel with letters. Tried it on other patients without success

## 2019-08-12 ENCOUNTER — Telehealth: Payer: Self-pay | Admitting: Neurology

## 2019-08-12 NOTE — Telephone Encounter (Signed)
I spoke to the patient. States her neck pain did improve after the trigger point injections. For now, she is going to continue PT. She declined the referral offer for pain management.  Denies any history of migraine, tension headaches or head pressure. She is already established with [facility] and plans to follow up with them for her continued tinnitus.

## 2019-08-12 NOTE — Telephone Encounter (Signed)
Dr. Krista Blue,  I saw you on January 4.   And you gave me a steroid injection at the base of left side of skull.  That has helped with pain in the neck.  I was hoping in time, the constant noise in my head would settle down as well.  But it hasn't.  I still have more left-sided head pain above the ear, sometimes tenderness and pain around the ear and this constant buzzing/ring in my head....it's mostly in my head.  Only sometimes does the "pitch" change and will be in my left ear.  And sometimes, like today, it just feels like "pressure" in my head along with the constant ring in my head.  I realize it's a Friday afternoon, and please this can wait until next week for a response. It just wears on me, and I just want my head to be quiet!    So that is my update. Thank you, Victoria [name]  Please call patient, the trigger point injection I performed on June 27, 2019 will only solve muscular/fascial pain, but would not help the constant buzzing/ringing sound in her head,  From reviewing the record, she had normal MRI of the brain, CT of the sinus, MRI of cervical spine showed mild degenerative changes  It is reassuring that there is no structure lesions for her complaints, depend on her most bothersome symptoms, if she still has significant neck pain, may consider physical therapy, given pain management, if she has frequent tinnitus, may consider ENT evaluation  Also ask her if she has a history of migraine headache, if she does, and has head pressure, headaches, may put her on nurse practitioner Sarah schedule

## 2019-08-15 ENCOUNTER — Ambulatory Visit: Payer: PPO | Attending: Family

## 2019-08-15 DIAGNOSIS — Z23 Encounter for immunization: Secondary | ICD-10-CM | POA: Insufficient documentation

## 2019-08-15 NOTE — Progress Notes (Signed)
   Covid-19 Vaccination Clinic  Name:  KATLYN LANDUYT    MRN: [id number] DOB: September 06, 1955  08/15/2019  Ms. Lazier was observed post Covid-19 immunization for 15 minutes without incidence. She was provided with Vaccine Information Sheet and instruction to access the V-Safe system.   Ms. Zinman was instructed to call 911 with any severe reactions post vaccine: Marland Kitchen Difficulty breathing  . Swelling of your face and throat  . A fast heartbeat  . A bad rash all over your body  . Dizziness and weakness    Immunizations Administered    Name Date Dose VIS Date Route   Moderna COVID-19 Vaccine 08/15/2019  3:33 PM 0.5 mL 05/21/2019 Intramuscular   Manufacturer: Moderna   [id number]   [id number]

## 2019-08-30 ENCOUNTER — Telehealth: Payer: Self-pay | Admitting: Internal Medicine

## 2019-08-30 NOTE — Telephone Encounter (Signed)
[name] Order: 30mg  #1 prefilled syringe Ordered date: 08/30/2019  Expected date of arrival: 09/04/2019 Ordered by: Desmond Dike, [place]  Specialty Pharmacy: [facility]

## 2019-09-04 ENCOUNTER — Encounter: Payer: Self-pay | Admitting: Internal Medicine

## 2019-09-04 NOTE — Telephone Encounter (Signed)
Fasenra Shipment Received:  30mg  #1 prefilled syringe Medication arrival date: 09/04/2019 Lot #: [id number] Exp date: 09/2020 Received by: Desmond Dike, [place]

## 2019-09-05 ENCOUNTER — Telehealth: Payer: Self-pay | Admitting: Internal Medicine

## 2019-09-05 MED ORDER — CIPROFLOXACIN HCL 500 MG PO TABS: 500.0000 mg | ORAL_TABLET | Freq: Two times a day (BID) | ORAL | 0 refills | Status: DC

## 2019-09-05 MED ORDER — METRONIDAZOLE 250 MG PO TABS: 250.0000 mg | ORAL_TABLET | Freq: Three times a day (TID) | ORAL | 0 refills | Status: DC

## 2019-09-05 NOTE — Telephone Encounter (Signed)
Pt states she is having her second episode of diverticulitis in the last few months. Reports she started having pain in her LLQ yesterday am, she is taking tylenol for discomfort, she is reporting discomfort when she walks and when she gets up from sitting. She has temp of 100.4 and reports having chills. Last episode she was able to get by without an antibiotic, states she doesn't know if she needs one now or not. Please advise.

## 2019-09-05 NOTE — Telephone Encounter (Signed)
She does have hx of diverticulosis and her last colon was in 2012 with Dr. Olevia Perches We can begin treatment with abx.  She has followup with me in April, but if not improving she should be seen sooner in the office by APP (ER of course if severe pain or other concerning symptoms) Begin cipro 500 mg bid and flagyl 250 mg TID x 7 days for probable diverticulitis I think she should have a colonoscopy sooner than 2022 (which would be time due for screening), but we can discuss this recommendation when I see her next month and once she recovers from acute diverticulitis

## 2019-09-05 NOTE — Telephone Encounter (Signed)
Spoke with pt and she is aware, scripts sent to pharmacy. 

## 2019-09-05 NOTE — Telephone Encounter (Signed)
Pt requested a call back to discuss diverticulitis flare.  She stated that she is having chills and a fever and is taking Tylenol for pain management.

## 2019-09-10 DIAGNOSIS — M542 Cervicalgia: Secondary | ICD-10-CM | POA: Diagnosis not present

## 2019-09-13 ENCOUNTER — Ambulatory Visit: Payer: PPO

## 2019-09-13 DIAGNOSIS — M9901 Segmental and somatic dysfunction of cervical region: Secondary | ICD-10-CM | POA: Diagnosis not present

## 2019-09-13 DIAGNOSIS — M9902 Segmental and somatic dysfunction of thoracic region: Secondary | ICD-10-CM | POA: Diagnosis not present

## 2019-09-13 DIAGNOSIS — M9905 Segmental and somatic dysfunction of pelvic region: Secondary | ICD-10-CM | POA: Diagnosis not present

## 2019-09-13 DIAGNOSIS — M9903 Segmental and somatic dysfunction of lumbar region: Secondary | ICD-10-CM | POA: Diagnosis not present

## 2019-09-17 ENCOUNTER — Ambulatory Visit: Payer: PPO | Attending: Family

## 2019-09-17 DIAGNOSIS — Z23 Encounter for immunization: Secondary | ICD-10-CM

## 2019-09-17 NOTE — Progress Notes (Signed)
   Covid-19 Vaccination Clinic  Name:  Victoria [name]    MRN: [id number] DOB: 04-Dec-1955  09/17/2019  [name] was observed post Covid-19 immunization for 15 minutes without incident. She was provided with Vaccine Information Sheet and instruction to access the V-Safe system.   Ms. Guardian was instructed to call 911 with any severe reactions post vaccine: Marland Kitchen Difficulty breathing  . Swelling of face and throat  . A fast heartbeat  . A bad rash all over body  . Dizziness and weakness   Immunizations Administered    Name Date Dose VIS Date Route   Moderna COVID-19 Vaccine 09/17/2019  1:02 PM 0.5 mL 05/21/2019 Intramuscular   Manufacturer: Moderna   Lot: [id number]   [id number]

## 2019-09-19 DIAGNOSIS — M9901 Segmental and somatic dysfunction of cervical region: Secondary | ICD-10-CM | POA: Diagnosis not present

## 2019-09-19 DIAGNOSIS — M9905 Segmental and somatic dysfunction of pelvic region: Secondary | ICD-10-CM | POA: Diagnosis not present

## 2019-09-19 DIAGNOSIS — M9903 Segmental and somatic dysfunction of lumbar region: Secondary | ICD-10-CM | POA: Diagnosis not present

## 2019-09-19 DIAGNOSIS — M9902 Segmental and somatic dysfunction of thoracic region: Secondary | ICD-10-CM | POA: Diagnosis not present

## 2019-09-23 ENCOUNTER — Other Ambulatory Visit: Payer: Self-pay | Admitting: Family Medicine

## 2019-09-23 DIAGNOSIS — M542 Cervicalgia: Secondary | ICD-10-CM | POA: Diagnosis not present

## 2019-10-02 ENCOUNTER — Telehealth: Payer: Self-pay | Admitting: Internal Medicine

## 2019-10-02 NOTE — Telephone Encounter (Signed)
Please disregard message

## 2019-10-02 NOTE — Telephone Encounter (Signed)
Pt is currently receiving Fasenra 30mg q8w at our office. We are starting to transition our patients to self administer at home. Please advise if you believe pt would be a good candidate for this. Thanks.  

## 2019-10-03 ENCOUNTER — Other Ambulatory Visit: Payer: Self-pay

## 2019-10-03 ENCOUNTER — Ambulatory Visit (INDEPENDENT_AMBULATORY_CARE_PROVIDER_SITE_OTHER): Payer: PPO

## 2019-10-03 ENCOUNTER — Encounter: Payer: Self-pay | Admitting: *Deleted

## 2019-10-03 DIAGNOSIS — J4550 Severe persistent asthma, uncomplicated: Secondary | ICD-10-CM

## 2019-10-03 MED ORDER — BENRALIZUMAB 30 MG/ML ~~LOC~~ SOSY
30.0000 mg | PREFILLED_SYRINGE | Freq: Once | SUBCUTANEOUS | Status: AC
  Administered 2019-10-03: 11:00:00 30 mg via SUBCUTANEOUS

## 2019-10-03 NOTE — Progress Notes (Signed)
All questions were answered by the patient before medication was administered. Have you been hospitalized in the last 10 days? No Do you have a fever? No Do you have a cough? No Do you have a headache or sore throat? No  

## 2019-10-04 ENCOUNTER — Telehealth: Payer: Self-pay | Admitting: Family Medicine

## 2019-10-04 ENCOUNTER — Encounter: Payer: Self-pay | Admitting: Neurology

## 2019-10-04 ENCOUNTER — Telehealth (INDEPENDENT_AMBULATORY_CARE_PROVIDER_SITE_OTHER): Payer: PPO | Admitting: Family Medicine

## 2019-10-04 ENCOUNTER — Other Ambulatory Visit: Payer: Self-pay

## 2019-10-04 ENCOUNTER — Telehealth: Payer: Self-pay | Admitting: *Deleted

## 2019-10-04 ENCOUNTER — Other Ambulatory Visit: Payer: Self-pay | Admitting: Family Medicine

## 2019-10-04 DIAGNOSIS — G479 Sleep disorder, unspecified: Secondary | ICD-10-CM | POA: Diagnosis not present

## 2019-10-04 DIAGNOSIS — R519 Headache, unspecified: Secondary | ICD-10-CM | POA: Diagnosis not present

## 2019-10-04 DIAGNOSIS — F32A Depression, unspecified: Secondary | ICD-10-CM

## 2019-10-04 DIAGNOSIS — M542 Cervicalgia: Secondary | ICD-10-CM | POA: Diagnosis not present

## 2019-10-04 DIAGNOSIS — R739 Hyperglycemia, unspecified: Secondary | ICD-10-CM | POA: Diagnosis not present

## 2019-10-04 DIAGNOSIS — L0291 Cutaneous abscess, unspecified: Secondary | ICD-10-CM | POA: Diagnosis not present

## 2019-10-04 DIAGNOSIS — F329 Major depressive disorder, single episode, unspecified: Secondary | ICD-10-CM | POA: Diagnosis not present

## 2019-10-04 DIAGNOSIS — F411 Generalized anxiety disorder: Secondary | ICD-10-CM

## 2019-10-04 MED ORDER — AMOXICILLIN-POT CLAVULANATE 875-125 MG PO TABS: 1.0000 | ORAL_TABLET | Freq: Two times a day (BID) | ORAL | 0 refills | Status: DC

## 2019-10-04 MED ORDER — BUSPIRONE HCL 5 MG PO TABS: 5.0000 mg | ORAL_TABLET | Freq: Three times a day (TID) | ORAL | 1 refills | Status: DC

## 2019-10-04 NOTE — Telephone Encounter (Signed)
I sent in Augmentin XR twice a day for a 7 day period. She should also take a sitz bath twice a day to get the abscess to open up. Also make sure she is taking a probiotic daily

## 2019-10-04 NOTE — Assessment & Plan Note (Signed)
Headache and neck pain, will refer to neurology for further treatment and consideration of Botox injections.

## 2019-10-04 NOTE — Telephone Encounter (Signed)
Caller : Victoria [name]  Call Back # [phone]   Subject : Abscess   Patient forgot to mention in appointment today that she has two abscesses (one has burst and is draining) on her female parts. Patient was wondering if Dr Charlett Blake will send in a medication to help with abscesses.

## 2019-10-04 NOTE — Assessment & Plan Note (Signed)
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 Is started on Buspar 5 mg tid

## 2019-10-04 NOTE — Progress Notes (Signed)
Virtual Visit via Video Note  I connected with Victoria [name] on 10/04/19 at  8:40 AM EDT by a video enabled telemedicine application and verified that I am speaking with the correct person using two identifiers.  Location: Patient: home Provider: home   I discussed the limitations of evaluation and management by telemedicine and the availability of in person appointments. The patient expressed understanding and agreed to proceed. Kem Boroughs, CMA was able to get the patient set up on a video visit   Subjective:    Patient ID: Victoria [name], female    DOB: Jul 29, 1955, 64 y.o.   MRN: [id number]  No chief complaint on file.   HPI Patient is in today for follow up on chronic medical concerns. No recent febrile illness or hospitalizations. She is struggling with fatigue, malaise, anhedonia, anxiety, neck pain and headache. 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 She is considering placing him in a permanent home as it is getting physically difficult. Her headaches persist and she has trouble sleeping.   Past Medical History:  Diagnosis Date  . 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 Anemia    iron deficiency, resolved  . Anxiety   . Asthma   . CAD (coronary artery disease)    a. LHC 6/16:  oOM1 60, pRCA 25 >> med Rx b. cath 3/19 2nd OM with 95% stenosis s/p synergy DES & anomalous RCA  . CAP (community acquired pneumonia) 2016; 06/07/2016  . Chronic bronchitis ([place])   . Chronic lower back pain   . Complication of anesthesia    "think I have a hard time waking up from it"  . COPD (chronic obstructive pulmonary disease) ([place])   . Depression    mild  . Diverticulitis   . Diverticulosis   . GERD (gastroesophageal reflux disease)   . H/O hiatal hernia   . Headache    "weekly" (08/23/2017)  . History of echocardiogram    Echo 6/16:  Mod LVH, EF 60-65%, no RWMA, Gr 1 DD, trivial  MR, normal LA size.  Marland Kitchen Hyperglycemia 11/20/2015  . Hyperlipidemia, mixed 09/11/2007   Qualifier: Diagnosis of  By: Jerold Coombe   Did not tolerate Lipitor, zocor, Lovastatin, Pravastatin, Livalo, Crestor even low dose   . IBS (irritable bowel syndrome)   . Maxillary sinusitis   . Normal cardiac stress test 11/2011   No evidence of ischemia or infarct. Calculated ejection fraction 72%.  . Obesity   . OSA (obstructive sleep apnea) 02/2012   has stopped using  cpap  . Osteoarthritis   . Osteoporosis   . Pneumonia 11/2011   "before 2013 I hadn't had pneumonia since I was a child" (04/13/2012)  . Pulmonary nodules   . S/P angioplasty with stent 08/23/17 ostial 2nd OM with DES synnergy 08/24/2017  . Schatzki's ring     Past Surgical History:  Procedure Laterality Date  . APPENDECTOMY  1989  . CARDIAC CATHETERIZATION N/A 11/25/2014   Procedure: Right/Left Heart Cath and Coronary Angiography;  Surgeon: Belva Crome, MD;  Location: [facility];  Service: Cardiovascular;  Laterality: N/A;  . CESAREAN SECTION  1985  . CORONARY ANGIOPLASTY WITH STENT PLACEMENT  08/23/2017  . CORONARY STENT INTERVENTION N/A 08/23/2017   Procedure: CORONARY STENT INTERVENTION;  Surgeon: Burnell Blanks, MD;  Location: [place] [facility];  Service: Cardiovascular;  Laterality: N/A;  . HERNIA REPAIR  04/13/2012  VHR laparoscopic  . LEFT HEART CATH AND CORONARY ANGIOGRAPHY N/A 08/23/2017   Procedure: LEFT HEART CATH AND CORONARY ANGIOGRAPHY;  Surgeon: Burnell Blanks, MD;  Location: [facility];  Service: Cardiovascular;  Laterality: N/A;  . VENTRAL HERNIA REPAIR  04/13/2012   Procedure: LAPAROSCOPIC VENTRAL HERNIA;  Surgeon: Adin Hector, MD;  Location: [facility];  Service: General;  Laterality: N/A;  laparoscopic repair of incarcerated hernia    Family History  Problem Relation Age of Onset  . Breast cancer Mother   . Hypertension Mother   . Diabetes Mother   . Diverticulosis Father     . Prostate cancer Father   . Pulmonary embolism Brother        recurrent  . Heart attack Maternal Grandfather   . Breast cancer Sister   . Stroke Neg Hx     Social History   Socioeconomic History  . Marital status: Married    Spouse name: Not on file  . Number of children: 1  . Years of education: 25  . Highest education level: Associate degree: academic program  Occupational History  . Occupation: Disabled   Tobacco Use  . Smoking status: Never Smoker  . Smokeless tobacco: Never Used  Substance and Sexual Activity  . Alcohol use: Yes    Comment: social use  . Drug use: No  . Sexual activity: Yes    Comment: gluten free, lives with husband and son with CP quadriplegia  Other Topics Concern  . 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    Lives at home with her husband and son.   Right-handed.   2 cups caffeine per day.   Social Determinants of Health   Financial Resource Strain:   . Difficulty of Paying Living Expenses:   Food Insecurity:   . Worried About Charity fundraiser in the Last Year:   . Arboriculturist in the Last Year:   Transportation Needs:   . Film/video editor (Medical):   Marland Kitchen Lack of Transportation (Non-Medical):   Physical Activity:   . Days of Exercise per Week:   . Minutes of Exercise per Session:   Stress:   . Feeling of Stress :   Social Connections:   . Frequency of Communication with Friends and Family:   . Frequency of Social Gatherings with Friends and Family:   . Attends Religious Services:   . Active Member of Clubs or Organizations:   . Attends Archivist Meetings:   Marland Kitchen Marital Status:   Intimate Partner Violence:   . Fear of Current or Ex-Partner:   . Emotionally Abused:   Marland Kitchen Physically Abused:   . Sexually Abused:     Outpatient Medications Prior to Visit  Medication Sig Dispense Refill  . acyclovir ointment (ZOVIRAX) 5 % Apply 1 application topically every 3 (three) hours. 15  g 2  . alendronate (FOSAMAX) 70 MG tablet TAKE 1 TABLET BY MOUTH EVERY 7 DAYS. TAKE WITH A FULL GLASS OF WATER ON AN EMPTY STOMACH. 4 tablet 5  . aspirin 81 MG tablet Take 81 mg by mouth daily.     . Benralizumab (FASENRA) 30 MG/ML SOSY Inject 1 mL into the skin every 8 (eight) weeks. 1 mL 11  . Bioflavonoid Products (ESTER C PO) Take 500 mg by mouth daily.    . Calcium-Magnesium-Vitamin D 300-150-400 MG-MG-UNIT TABS Take 1 tablet by mouth daily.    . cyclobenzaprine (  FLEXERIL) 5 MG tablet TAKE 1/2 TO 2 TABLETS BY MOUTH 2 TIMES DAILY AS NEEDED FOR MUSCLE SPASMS. 30 tablet 0  . Evolocumab (REPATHA SURECLICK) XX123456 MG/ML SOAJ Inject 1 pen into the skin every 14 (fourteen) days. 6 pen 3  . LORazepam (ATIVAN) 0.5 MG tablet TAKE 1/2 TO 1 TABLET BY MOUTH AT BEDTIME AS NEEDED FOR ANXIETY OR SLEEP. 30 tablet 2  . MAGNESIUM CITRATE PO Take 300 mg by mouth daily.    . Melatonin 3 MG CAPS Take 3 mg by mouth at bedtime as needed.     . metroNIDAZOLE (FLAGYL) 250 MG tablet Take 1 tablet (250 mg total) by mouth 3 (three) times daily. 21 tablet 0  . mupirocin ointment (BACTROBAN) 2 % Place 1 application into the nose 2 (two) times daily. 22 g 1  . nitroGLYCERIN (NITROSTAT) 0.4 MG SL tablet PLACE 1 TABLET UNDER THE TONGUE EVERY 5 MINUTES AS NEEDED FOR CHEST PAIN. 25 tablet 4  . Venlafaxine HCl 150 MG TB24 Take 1 tablet (150 mg total) by mouth daily. 90 tablet 1  . VITAMIN D PO Take 5,000 Units by mouth daily.    . ciprofloxacin (CIPRO) 500 MG tablet Take 1 tablet (500 mg total) by mouth 2 (two) times daily. 14 tablet 0   No facility-administered medications prior to visit.    Allergies  Allergen Reactions  . Beclomethasone Dipropionate Hives and Other (See Comments)     weight gain  . Mometasone Furo-Formoterol Fum Hives and Other (See Comments)    weight gain  . Sulfonamide Derivatives Hives and Rash  . Statins     Myalgias, RLS    Review of Systems  Constitutional: Positive for malaise/fatigue.  Negative for fever.  HENT: Negative for congestion.   Eyes: Negative for blurred vision.  Respiratory: Negative for shortness of breath.   Cardiovascular: Negative for chest pain, palpitations and leg swelling.  Gastrointestinal: Negative for abdominal pain, blood in stool and nausea.  Genitourinary: Negative for dysuria and frequency.  Musculoskeletal: Positive for neck pain. Negative for falls.  Skin: Negative for rash.  Neurological: Positive for headaches. Negative for dizziness and loss of consciousness.  Endo/Heme/Allergies: Negative for environmental allergies.  Psychiatric/Behavioral: Positive for depression. The patient is nervous/anxious and has insomnia.        Objective:    Physical Exam Constitutional:      Appearance: Normal appearance. She is not ill-appearing.  HENT:     Head: Normocephalic and atraumatic.     Right Ear: External ear normal.     Left Ear: External ear normal.     Nose: Nose normal.  Pulmonary:     Effort: Pulmonary effort is normal.  Neurological:     Mental Status: She is alert and oriented to person, place, and time.  Psychiatric:        Behavior: Behavior normal.     There were no vitals taken for this visit. Wt Readings from Last 3 Encounters:  06/27/19 162 lb (73.5 kg)  06/06/19 160 lb (72.6 kg)  05/08/19 164 lb (74.4 kg)    Diabetic Foot Exam - Simple   No data filed     Lab Results  Component Value Date   WBC 5.4 05/29/2019   HGB 13.5 05/29/2019   HCT 40.8 05/29/2019   PLT 291.0 05/29/2019   GLUCOSE 93 05/29/2019   CHOL 141 03/11/2019   TRIG 93.0 03/11/2019   HDL 56.10 03/11/2019   LDLDIRECT 177.8 09/07/2011   LDLCALC 66 03/11/2019  ALT 16 05/29/2019   AST 15 05/29/2019   NA 137 05/29/2019   K 3.8 05/29/2019   CL 101 05/29/2019   CREATININE 0.62 05/29/2019   BUN 13 05/29/2019   CO2 29 05/29/2019   TSH 2.24 03/11/2019   INR 1.0 08/21/2017   HGBA1C 5.7 03/11/2019    Lab Results  Component Value Date   TSH  2.24 03/11/2019   Lab Results  Component Value Date   WBC 5.4 05/29/2019   HGB 13.5 05/29/2019   HCT 40.8 05/29/2019   MCV 90.9 05/29/2019   PLT 291.0 05/29/2019   Lab Results  Component Value Date   NA 137 05/29/2019   K 3.8 05/29/2019   CO2 29 05/29/2019   GLUCOSE 93 05/29/2019   BUN 13 05/29/2019   CREATININE 0.62 05/29/2019   BILITOT 0.4 05/29/2019   ALKPHOS 77 05/29/2019   AST 15 05/29/2019   ALT 16 05/29/2019   PROT 7.0 05/29/2019   ALBUMIN 4.4 05/29/2019   CALCIUM 9.3 05/29/2019   ANIONGAP 6 08/24/2017   GFR 97.52 05/29/2019   Lab Results  Component Value Date   CHOL 141 03/11/2019   Lab Results  Component Value Date   HDL 56.10 03/11/2019   Lab Results  Component Value Date   LDLCALC 66 03/11/2019   Lab Results  Component Value Date   TRIG 93.0 03/11/2019   Lab Results  Component Value Date   CHOLHDL 3 03/11/2019   Lab Results  Component Value Date   HGBA1C 5.7 03/11/2019       Assessment & Plan:   Problem List Items Addressed This Visit    Depression (Chronic)    Will continue venlafaxine for now but if symptoms continue to worsen may consider switching meds      Relevant Medications   busPIRone (BUSPAR) 5 MG tablet   Anxiety state (Chronic)    Very stressed as she continues to be the sole care provider of her adult son with special needs. Is started on Buspar 5 mg tid      Relevant Medications   busPIRone (BUSPAR) 5 MG tablet   Hyperglycemia (Chronic)    hgba1c acceptable, minimize simple carbs. Increase exercise as tolerated.       Sleep disorder due to stress (Chronic)    Encouraged to add magnesium glycinate to melaonin.Encouraged good sleep hygiene such as dark, quiet room. No blue/green glowing lights such as computer screens in bedroom. No alcohol or stimulants in evening. Cut down on caffeine as able. Regular exercise is helpful but not just prior to bed time.       Abscess    Started on augmentin and sitz baths bid.        Neck pain   Relevant Orders   Ambulatory referral to Neurology   Headache - Primary   Relevant Orders   Ambulatory referral to Neurology   Occipital pain    Headache and neck pain, will refer to neurology for further treatment and consideration of Botox injections.          I am having Victoria [name] start on busPIRone. I am also having her maintain her aspirin, Calcium-Magnesium-Vitamin D, acyclovir ointment, Bioflavonoid Products (ESTER C PO), Evolocumab, nitroGLYCERIN, mupirocin ointment, alendronate, Venlafaxine HCl, VITAMIN D PO, MAGNESIUM CITRATE PO, Melatonin, LORazepam, Fasenra, metroNIDAZOLE, and cyclobenzaprine.  Meds ordered this encounter  Medications  . busPIRone (BUSPAR) 5 MG tablet    Sig: Take 1 tablet (5 mg total) by mouth 3 (three) times daily.  Dispense:  45 tablet    Refill:  1     I discussed the assessment and treatment plan with the patient. The patient was provided an opportunity to ask questions and all were answered. The patient agreed with the plan and demonstrated an understanding of the instructions.   The patient was advised to call back or seek an in-person evaluation if the symptoms worsen or if the condition fails to improve as anticipated.  I provided 25 minutes of non-face-to-face time during this encounter.   [name], MD

## 2019-10-04 NOTE — Telephone Encounter (Signed)
Patient notified

## 2019-10-04 NOTE — Assessment & Plan Note (Signed)
hgba1c acceptable, minimize simple carbs. Increase exercise as tolerated.  

## 2019-10-04 NOTE — Assessment & Plan Note (Signed)
Started on augmentin and sitz baths bid.

## 2019-10-04 NOTE — Assessment & Plan Note (Signed)
Encouraged to add magnesium glycinate to melaonin.Encouraged good sleep hygiene such as dark, quiet room. No blue/green glowing lights such as computer screens in bedroom. No alcohol or stimulants in evening. Cut down on caffeine as able. Regular exercise is helpful but not just prior to bed time.

## 2019-10-04 NOTE — Telephone Encounter (Signed)
Left message on machine to call back to schedule appointment.  Need to schedule follow up for 3-4 weeks for headache and neck pain.

## 2019-10-04 NOTE — Assessment & Plan Note (Signed)
Will continue venlafaxine for now but if symptoms continue to worsen may consider switching meds

## 2019-10-10 ENCOUNTER — Ambulatory Visit: Payer: PPO | Admitting: Internal Medicine

## 2019-10-10 ENCOUNTER — Encounter: Payer: Self-pay | Admitting: Internal Medicine

## 2019-10-10 VITALS — BP 110/76 | HR 68 | Temp 98.5°F | Ht 62.0 in | Wt 157.0 lb

## 2019-10-10 DIAGNOSIS — K219 Gastro-esophageal reflux disease without esophagitis: Secondary | ICD-10-CM

## 2019-10-10 DIAGNOSIS — R1310 Dysphagia, unspecified: Secondary | ICD-10-CM

## 2019-10-10 DIAGNOSIS — Z8719 Personal history of other diseases of the digestive system: Secondary | ICD-10-CM | POA: Diagnosis not present

## 2019-10-10 MED ORDER — SUPREP BOWEL PREP KIT 17.5-3.13-1.6 GM/177ML PO SOLN: 1.0000 | ORAL | 0 refills | Status: DC

## 2019-10-10 NOTE — Progress Notes (Signed)
Subjective:    Patient ID: Victoria [name], female    DOB: Mar 08, 1956, 64 y.o.   MRN: [id number]  HPI Victoria [name] is a 64 year old female with a history of GERD with large hiatal hernia and Schatzki's ring status post balloon dilation in 2016, IBS, pulmonary aspergillosis/bronchiectasis/eosinophilic asthma, history of colonic diverticulosis who returns for follow-up.  She was last seen in December 2016.  She is here alone today.  She has had 2 episodes of diverticulitis in the last few months.  Recently she called with persistent left lower quadrant abdominal pain and I prescribed ciprofloxacin and metronidazole.  She took ciprofloxacin only which led to complete resolution of her left lower quadrant abdominal pain.  Earlier this year she had a milder attack of similar left lower quadrant pain which she treated by herself by reducing her diet to a liquid and pured food diet and symptoms did resolve at that time without intervention.  She now reports complete resolution of abdominal pain.  Bowel habits have mostly returned to normal for her though this is often 3-5 stools per day.  Stools can be loose to formed.  She did take probiotic for some time and then stopped but has recently resumed it.  She is also being treated for Augmentin for a perineal skin abscess.  From a GI perspective she is having intermittent issues swallowing solid food.  This is not consistent.  Her heartburn is not much of an issue though occasionally she will use Pepcid Complete which helps.  She previously took Zegerid but stopped this during the period that she needed Plavix after her cardiac stent.  She is subsequently off Plavix but never resumed PPI.  She does note that stress and prior to travel she can have left lower quadrant abdominal discomfort.  Her family history is notable for both her sister and father that had issues with diverticular hemorrhage.  Her father which was severe enough to warrant emergency surgery where a  majority of his colon was removed.  Stress has been higher lately as she helps care for her son Victoria [name].  They have had home health aides only 50% of the time and so she is having an increased burden of providing care for him.  Her last colonoscopy was in 2012 with Dr. Maurene Capes which showed diverticulosis in the sigmoid.  The exam was otherwise normal.  Review of Systems As per HPI, otherwise negative  Current Medications, Allergies, Past Medical History, Past Surgical History, Family History and Social History were reviewed in [employer] record.     Objective:   Physical Exam BP 110/76   Pulse 68   Temp 98.5 F (36.9 C)   Ht 5\' 2"  (1.575 m)   Wt 157 lb (71.2 kg)   BMI 28.72 kg/m  Gen: awake, alert, NAD HEENT: anicteric CV: RRR, no mrg Pulm: CTA b/l Abd: soft, NT/ND, +BS throughout Ext: no c/c/e Neuro: nonfocal      Assessment & Plan:   64 year old female with a history of GERD with large hiatal hernia and Schatzki's ring status post balloon dilation in 2016, IBS, pulmonary aspergillosis/bronchiectasis/eosinophilic asthma, history of colonic diverticulosis who returns for follow-up.  1.  Recent diverticulitis --resolved with antibiotics.  We discussed diverticulitis and diverticulosis at length today.  She worries about diverticular hemorrhage given her family history but we discussed how diverticulitis and diverticular hemorrhage rarely occur together.  I have recommended that we update her screening colonoscopy at this time as it has been  9 years since her last exam and also with her issues with diverticulitis.  These attacks were uncomplicated and only one required antibiotics and it has resolved.  Exam is benign today. --Colonoscopy in the [facility]; we reviewed the risk, benefits and alternatives and she is agreeable and wishes to proceed --I do recommend she continue probiotic daily for at least the next month or 2 given exposure to antibiotics recently and most  recently Augmentin for skin infection.  2.  GERD with hiatal hernia/intermittent dysphagia --upper endoscopy again recommended with probable balloon dilation.  Given that heartburn symptom is infrequent she can use Pepcid 20 mg twice daily AC on an as-needed basis. --Upper endoscopy in the [place] with probable dilation

## 2019-10-10 NOTE — Patient Instructions (Addendum)
You have been scheduled for an endoscopy and colonoscopy. Please follow the written instructions given to you at your visit today. Please pick up your prep supplies at the pharmacy within the next 1-3 days. If you use inhalers (even only as needed), please bring them with you on the day of your procedure. Your physician has requested that you go to www.[email or web address].[email or web address] and enter the access code given to you at your visit today. This web site gives a general overview about your procedure. However, you should still follow specific instructions given to you by our office regarding your preparation for the procedure.  Please purchase the following medications over the counter and take as directed: Pepcid completed twice daily as needed  Please purchase the following medications over the counter and take as directed: Probiotic OTC daily  If you are age 20 or older, your body mass index should be between 23-30. Your Body mass index is 28.72 kg/m. If this is out of the aforementioned range listed, please consider follow up with your Primary Care Provider.  If you are age 56 or younger, your body mass index should be between 19-25. Your Body mass index is 28.72 kg/m. If this is out of the aformentioned range listed, please consider follow up with your Primary Care Provider.   Due to recent changes in healthcare laws, you may see the results of your imaging and laboratory studies on MyChart before your provider has had a chance to review them.  We understand that in some cases there may be results that are confusing or concerning to you. Not all laboratory results come back in the same time frame and the provider may be waiting for multiple results in order to interpret others.  Please give [name] 48 hours in order for your provider to thoroughly review all the results before contacting the office for clarification of your results.

## 2019-10-25 ENCOUNTER — Other Ambulatory Visit: Payer: Self-pay

## 2019-10-25 ENCOUNTER — Telehealth: Payer: Self-pay | Admitting: *Deleted

## 2019-10-25 ENCOUNTER — Telehealth (INDEPENDENT_AMBULATORY_CARE_PROVIDER_SITE_OTHER): Payer: PPO | Admitting: Family Medicine

## 2019-10-25 DIAGNOSIS — F418 Other specified anxiety disorders: Secondary | ICD-10-CM

## 2019-10-25 DIAGNOSIS — G4700 Insomnia, unspecified: Secondary | ICD-10-CM

## 2019-10-25 MED ORDER — LORAZEPAM 1 MG PO TABS: 0.5000 mg | ORAL_TABLET | Freq: Two times a day (BID) | ORAL | 2 refills | Status: DC | PRN

## 2019-10-25 NOTE — Telephone Encounter (Signed)
Left message on machine to call back   Needs appointment in July for follow up insomnia

## 2019-10-28 NOTE — Assessment & Plan Note (Signed)
She is doing better after stating Ashwaganda. She never tried the Buspar. She has not acute concerns today. No changes at this time.

## 2019-10-28 NOTE — Assessment & Plan Note (Signed)
Encouraged good sleep hygiene such as dark, quiet room. No blue/green glowing lights such as computer screens in bedroom. No alcohol or stimulants in evening. Cut down on caffeine as able. Regular exercise is helpful but not just prior to bed time. Can use Melatonin and Magnesium Glycinate and report if no irmprovement

## 2019-10-28 NOTE — Progress Notes (Signed)
Virtual Visit via Video Note  I connected with Victoria [name] on 10/25/19 at 10:00 AM EDT by a video enabled telemedicine application and verified that I am speaking with the correct person using two identifiers.  Location: Patient: home Provider: home   I discussed the limitations of evaluation and management by telemedicine and the availability of in person appointments. The patient expressed understanding and agreed to proceed. Kem Boroughs, CMA was able to set the patient up on visit, video    Subjective:    Patient ID: Victoria [name], female    DOB: 1956/05/05, 64 y.o.   MRN: [id number]  Chief Complaint  Patient presents with  . Follow-up    HPI Patient is in today for follow up on anxiety ad stress. She is doing much better and chose not to start the Buspar. She started Ashwaganda instead and she feels it is helping. No new concerns. No recent febrile illness or hospitalizations. Denies CP/palp/SOB/HA/congestion/fevers/GI or GU c/o. Taking meds as prescribed  Past Medical History:  Diagnosis Date  . Allergic bronchopulmonary aspergillosis ([place]) 2008   sees Dr Edmund Hilda pulmonology  . Anemia    iron deficiency, resolved  . Anxiety   . Asthma   . CAD (coronary artery disease)    a. LHC 6/16:  oOM1 60, pRCA 25 >> med Rx b. cath 3/19 2nd OM with 95% stenosis s/p synergy DES & anomalous RCA  . CAP (community acquired pneumonia) 2016; 06/07/2016  . Chronic bronchitis ([place])   . Chronic lower back pain   . Complication of anesthesia    "think I have a hard time waking up from it"  . COPD (chronic obstructive pulmonary disease) ([place])   . Depression    mild  . Diverticulitis   . Diverticulosis   . GERD (gastroesophageal reflux disease)   . H/O hiatal hernia   . Headache    "weekly" (08/23/2017)  . History of echocardiogram    Echo 6/16:  Mod LVH, EF 60-65%, no RWMA, Gr 1 DD, trivial MR, normal LA size.  Marland Kitchen Hyperglycemia 11/20/2015  . Hyperlipidemia, mixed  09/11/2007   Qualifier: Diagnosis of  By: Jerold Coombe   Did not tolerate Lipitor, zocor, Lovastatin, Pravastatin, Livalo, Crestor even low dose   . IBS (irritable bowel syndrome)   . Maxillary sinusitis   . Normal cardiac stress test 11/2011   No evidence of ischemia or infarct. Calculated ejection fraction 72%.  . Obesity   . OSA (obstructive sleep apnea) 02/2012   has stopped using  cpap  . Osteoarthritis   . Osteoporosis   . Pneumonia 11/2011   "before 2013 I hadn't had pneumonia since I was a child" (04/13/2012)  . Pulmonary nodules   . S/P angioplasty with stent 08/23/17 ostial 2nd OM with DES synnergy 08/24/2017  . Schatzki's ring     Past Surgical History:  Procedure Laterality Date  . APPENDECTOMY  1989  . CARDIAC CATHETERIZATION N/A 11/25/2014   Procedure: Right/Left Heart Cath and Coronary Angiography;  Surgeon: Belva Crome, MD;  Location: [facility];  Service: Cardiovascular;  Laterality: N/A;  . CESAREAN SECTION  1985  . CORONARY ANGIOPLASTY WITH STENT PLACEMENT  08/23/2017  . CORONARY STENT INTERVENTION N/A 08/23/2017   Procedure: CORONARY STENT INTERVENTION;  Surgeon: Burnell Blanks, MD;  Location: [facility];  Service: Cardiovascular;  Laterality: N/A;  . HERNIA REPAIR  04/13/2012   VHR laparoscopic  . LEFT HEART CATH AND CORONARY ANGIOGRAPHY N/A  08/23/2017   Procedure: LEFT HEART CATH AND CORONARY ANGIOGRAPHY;  Surgeon: Burnell Blanks, MD;  Location: [facility];  Service: Cardiovascular;  Laterality: N/A;  . VENTRAL HERNIA REPAIR  04/13/2012   Procedure: LAPAROSCOPIC VENTRAL HERNIA;  Surgeon: Adin Hector, MD;  Location: [facility];  Service: General;  Laterality: N/A;  laparoscopic repair of incarcerated hernia    Family History  Problem Relation Age of Onset  . Breast cancer Mother   . Hypertension Mother   . Diabetes Mother   . Diverticulosis Father   . Prostate cancer Father   . Pulmonary embolism Brother         recurrent  . Heart attack Maternal Grandfather   . Breast cancer Sister   . Stroke Neg Hx     Social History   Socioeconomic History  . Marital status: Married    Spouse name: Not on file  . Number of children: 1  . Years of education: 27  . Highest education level: Associate degree: academic program  Occupational History  . Occupation: Disabled   Tobacco Use  . Smoking status: Never Smoker  . Smokeless tobacco: Never Used  Substance and Sexual Activity  . Alcohol use: Yes    Comment: social use  . Drug use: No  . Sexual activity: Yes    Comment: gluten free, lives with husband and son with CP quadriplegia  Other Topics Concern  . 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    Lives at home with her husband and son.   Right-handed.   2 cups caffeine per day.   Social Determinants of Health   Financial Resource Strain:   . Difficulty of Paying Living Expenses:   Food Insecurity:   . Worried About Charity fundraiser in the Last Year:   . Arboriculturist in the Last Year:   Transportation Needs:   . Film/video editor (Medical):   Marland Kitchen Lack of Transportation (Non-Medical):   Physical Activity:   . Days of Exercise per Week:   . Minutes of Exercise per Session:   Stress:   . Feeling of Stress :   Social Connections:   . Frequency of Communication with Friends and Family:   . Frequency of Social Gatherings with Friends and Family:   . Attends Religious Services:   . Active Member of Clubs or Organizations:   . Attends Archivist Meetings:   Marland Kitchen Marital Status:   Intimate Partner Violence:   . Fear of Current or Ex-Partner:   . Emotionally Abused:   Marland Kitchen Physically Abused:   . Sexually Abused:     Outpatient Medications Prior to Visit  Medication Sig Dispense Refill  . acyclovir ointment (ZOVIRAX) 5 % Apply 1 application topically every 3 (three) hours. 15 g 2  . alendronate (FOSAMAX) 70 MG tablet TAKE 1 TABLET BY MOUTH  EVERY 7 DAYS. TAKE WITH A FULL GLASS OF WATER ON AN EMPTY STOMACH. 4 tablet 5  . ASHWAGANDHA PO Take by mouth.    Marland Kitchen aspirin 81 MG tablet Take 81 mg by mouth daily.     . Benralizumab (FASENRA) 30 MG/ML SOSY Inject 1 mL into the skin every 8 (eight) weeks. 1 mL 11  . Bioflavonoid Products (ESTER C PO) Take 500 mg by mouth daily.    . Calcium-Magnesium-Vitamin D 300-150-400 MG-MG-UNIT TABS Take 1 tablet by mouth daily.    . cyclobenzaprine (FLEXERIL) 5 MG  tablet TAKE 1/2 TO 2 TABLETS BY MOUTH 2 TIMES DAILY AS NEEDED FOR MUSCLE SPASMS. 30 tablet 0  . Evolocumab (REPATHA SURECLICK) 778 MG/ML SOAJ Inject 1 pen into the skin every 14 (fourteen) days. 6 pen 3  . MAGNESIUM GLYCINATE PO Take 400 mg by mouth daily.    . Melatonin 3 MG CAPS Take 3 mg by mouth at bedtime as needed.     . mupirocin ointment (BACTROBAN) 2 % Place 1 application into the nose 2 (two) times daily. 22 g 1  . nitroGLYCERIN (NITROSTAT) 0.4 MG SL tablet PLACE 1 TABLET UNDER THE TONGUE EVERY 5 MINUTES AS NEEDED FOR CHEST PAIN. 25 tablet 4  . Venlafaxine HCl 150 MG TB24 Take 1 tablet (150 mg total) by mouth daily. 90 tablet 1  . VITAMIN D PO Take 5,000 Units by mouth daily.    Marland Kitchen LORazepam (ATIVAN) 0.5 MG tablet TAKE 1/2 TO 1 TABLET BY MOUTH AT BEDTIME AS NEEDED FOR ANXIETY OR SLEEP. 30 tablet 2  . MAGNESIUM CITRATE PO Take 300 mg by mouth daily.    . busPIRone (BUSPAR) 5 MG tablet Take 1 tablet (5 mg total) by mouth 3 (three) times daily. (Patient not taking: Reported on 10/25/2019) 45 tablet 1  . amoxicillin-clavulanate (AUGMENTIN) 875-125 MG tablet Take 1 tablet by mouth 2 (two) times daily. 14 tablet 0  . SUPREP BOWEL PREP KIT 17.5-3.13-1.6 GM/177ML SOLN Take 1 kit by mouth as directed. For colonoscopy prep BIN: [id number] PCN: CN GROUP: [id number] ID: [id number] 354 mL 0  . venlafaxine XR (EFFEXOR-XR) 150 MG 24 hr capsule Take 150 mg by mouth daily.     No facility-administered medications prior to visit.    Allergies  Allergen  Reactions  . Beclomethasone Dipropionate Hives and Other (See Comments)     weight gain  . Mometasone Furo-Formoterol Fum Hives and Other (See Comments)    weight gain  . Sulfonamide Derivatives Hives and Rash  . Statins     Myalgias, RLS    Review of Systems  Constitutional: Negative for fever and malaise/fatigue.  HENT: Negative for congestion.   Eyes: Negative for blurred vision.  Respiratory: Negative for shortness of breath.   Cardiovascular: Negative for chest pain, palpitations and leg swelling.  Gastrointestinal: Negative for abdominal pain, blood in stool and nausea.  Genitourinary: Negative for dysuria and frequency.  Musculoskeletal: Negative for falls.  Skin: Negative for rash.  Neurological: Negative for dizziness, loss of consciousness and headaches.  Endo/Heme/Allergies: Negative for environmental allergies.  Psychiatric/Behavioral: Negative for depression. The patient is not nervous/anxious.        Objective:    Physical Exam Constitutional:      Appearance: Normal appearance. She is not ill-appearing.  HENT:     Head: Normocephalic and atraumatic.     Right Ear: External ear normal.     Left Ear: External ear normal.     Nose: Nose normal.  Eyes:     General:        Right eye: No discharge.  Pulmonary:     Effort: Pulmonary effort is normal.  Neurological:     Mental Status: She is alert and oriented to person, place, and time.  Psychiatric:        Behavior: Behavior normal.     There were no vitals taken for this visit. Wt Readings from Last 3 Encounters:  10/10/19 157 lb (71.2 kg)  06/27/19 162 lb (73.5 kg)  06/06/19 160 lb (72.6 kg)    Diabetic  Foot Exam - Simple   No data filed     Lab Results  Component Value Date   WBC 5.4 05/29/2019   HGB 13.5 05/29/2019   HCT 40.8 05/29/2019   PLT 291.0 05/29/2019   GLUCOSE 93 05/29/2019   CHOL 141 03/11/2019   TRIG 93.0 03/11/2019   HDL 56.10 03/11/2019   LDLDIRECT 177.8 09/07/2011    LDLCALC 66 03/11/2019   ALT 16 05/29/2019   AST 15 05/29/2019   NA 137 05/29/2019   K 3.8 05/29/2019   CL 101 05/29/2019   CREATININE 0.62 05/29/2019   BUN 13 05/29/2019   CO2 29 05/29/2019   TSH 2.24 03/11/2019   INR 1.0 08/21/2017   HGBA1C 5.7 03/11/2019    Lab Results  Component Value Date   TSH 2.24 03/11/2019   Lab Results  Component Value Date   WBC 5.4 05/29/2019   HGB 13.5 05/29/2019   HCT 40.8 05/29/2019   MCV 90.9 05/29/2019   PLT 291.0 05/29/2019   Lab Results  Component Value Date   NA 137 05/29/2019   K 3.8 05/29/2019   CO2 29 05/29/2019   GLUCOSE 93 05/29/2019   BUN 13 05/29/2019   CREATININE 0.62 05/29/2019   BILITOT 0.4 05/29/2019   ALKPHOS 77 05/29/2019   AST 15 05/29/2019   ALT 16 05/29/2019   PROT 7.0 05/29/2019   ALBUMIN 4.4 05/29/2019   CALCIUM 9.3 05/29/2019   ANIONGAP 6 08/24/2017   GFR 97.52 05/29/2019   Lab Results  Component Value Date   CHOL 141 03/11/2019   Lab Results  Component Value Date   HDL 56.10 03/11/2019   Lab Results  Component Value Date   LDLCALC 66 03/11/2019   Lab Results  Component Value Date   TRIG 93.0 03/11/2019   Lab Results  Component Value Date   CHOLHDL 3 03/11/2019   Lab Results  Component Value Date   HGBA1C 5.7 03/11/2019       Assessment & Plan:   Problem List Items Addressed This Visit    Depression with anxiety    She is doing better after stating Ashwaganda. She never tried the Buspar. She has not acute concerns today. No changes at this time.       Relevant Medications   LORazepam (ATIVAN) 1 MG tablet   Insomnia    Encouraged good sleep hygiene such as dark, quiet room. No blue/green glowing lights such as computer screens in bedroom. No alcohol or stimulants in evening. Cut down on caffeine as able. Regular exercise is helpful but not just prior to bed time. Can use Melatonin and Magnesium Glycinate and report if no irmprovement         I have discontinued Tenna Child. Alcaide's  MAGNESIUM CITRATE PO, LORazepam, amoxicillin-clavulanate, and Suprep Bowel Prep Kit. I am also having her start on LORazepam. Additionally, I am having her maintain her aspirin, Calcium-Magnesium-Vitamin D, acyclovir ointment, Bioflavonoid Products (ESTER C PO), Evolocumab, nitroGLYCERIN, mupirocin ointment, alendronate, Venlafaxine HCl, VITAMIN D PO, Melatonin, Fasenra, cyclobenzaprine, busPIRone, MAGNESIUM GLYCINATE PO, and ASHWAGANDHA PO.  Meds ordered this encounter  Medications  . LORazepam (ATIVAN) 1 MG tablet    Sig: Take 0.5-1 tablets (0.5-1 mg total) by mouth 2 (two) times daily as needed for anxiety or sleep.    Dispense:  30 tablet    Refill:  2     I discussed the assessment and treatment plan with the patient. The patient was provided an opportunity to ask questions and all  were answered. The patient agreed with the plan and demonstrated an understanding of the instructions.   The patient was advised to call back or seek an in-person evaluation if the symptoms worsen or if the condition fails to improve as anticipated.  I provided 15 minutes of non-face-to-face time during this encounter.   [name], MD

## 2019-10-30 ENCOUNTER — Other Ambulatory Visit: Payer: Self-pay | Admitting: Cardiology

## 2019-11-05 ENCOUNTER — Encounter: Payer: Self-pay | Admitting: Family Medicine

## 2019-11-05 ENCOUNTER — Encounter: Payer: Self-pay | Admitting: Internal Medicine

## 2019-11-11 DIAGNOSIS — M542 Cervicalgia: Secondary | ICD-10-CM | POA: Diagnosis not present

## 2019-11-13 ENCOUNTER — Telehealth: Payer: Self-pay

## 2019-11-13 ENCOUNTER — Other Ambulatory Visit: Payer: Self-pay

## 2019-11-13 DIAGNOSIS — Z8719 Personal history of other diseases of the digestive system: Secondary | ICD-10-CM

## 2019-11-13 DIAGNOSIS — K219 Gastro-esophageal reflux disease without esophagitis: Secondary | ICD-10-CM

## 2019-11-13 MED ORDER — FAMOTIDINE 20 MG PO TABS: 20.0000 mg | ORAL_TABLET | Freq: Every day | ORAL | 6 refills | Status: DC

## 2019-11-13 MED ORDER — PANTOPRAZOLE SODIUM 40 MG PO TBEC: 40.0000 mg | DELAYED_RELEASE_TABLET | Freq: Every day | ORAL | 3 refills | Status: DC

## 2019-11-13 NOTE — Telephone Encounter (Signed)
Please see patient message on 11/13/2019.

## 2019-11-13 NOTE — Telephone Encounter (Signed)
I would have her start pantoprazole 40 mg once daily and use pepcid 20 mg each evening until EGD If symptoms continue to be worse before EGD next week she should let me know

## 2019-11-13 NOTE — Telephone Encounter (Signed)
Spoke with patient regarding rx recommendation, pt advised to call office if symptoms do not resolve or get worse before procedure. RX sent in.

## 2019-11-19 ENCOUNTER — Encounter: Payer: Self-pay | Admitting: Internal Medicine

## 2019-11-19 ENCOUNTER — Ambulatory Visit (AMBULATORY_SURGERY_CENTER): Payer: PPO | Admitting: Internal Medicine

## 2019-11-19 ENCOUNTER — Other Ambulatory Visit: Payer: Self-pay

## 2019-11-19 ENCOUNTER — Telehealth: Payer: Self-pay | Admitting: Internal Medicine

## 2019-11-19 VITALS — BP 110/56 | HR 100 | Temp 96.9°F | Resp 25 | Ht 62.0 in | Wt 157.0 lb

## 2019-11-19 DIAGNOSIS — K222 Esophageal obstruction: Secondary | ICD-10-CM

## 2019-11-19 DIAGNOSIS — K449 Diaphragmatic hernia without obstruction or gangrene: Secondary | ICD-10-CM

## 2019-11-19 DIAGNOSIS — K50119 Crohn's disease of large intestine with unspecified complications: Secondary | ICD-10-CM | POA: Diagnosis not present

## 2019-11-19 DIAGNOSIS — R1310 Dysphagia, unspecified: Secondary | ICD-10-CM | POA: Diagnosis not present

## 2019-11-19 DIAGNOSIS — I2510 Atherosclerotic heart disease of native coronary artery without angina pectoris: Secondary | ICD-10-CM | POA: Diagnosis not present

## 2019-11-19 DIAGNOSIS — K219 Gastro-esophageal reflux disease without esophagitis: Secondary | ICD-10-CM

## 2019-11-19 DIAGNOSIS — Z1211 Encounter for screening for malignant neoplasm of colon: Secondary | ICD-10-CM

## 2019-11-19 DIAGNOSIS — R1319 Other dysphagia: Secondary | ICD-10-CM

## 2019-11-19 DIAGNOSIS — E669 Obesity, unspecified: Secondary | ICD-10-CM | POA: Diagnosis not present

## 2019-11-19 DIAGNOSIS — J449 Chronic obstructive pulmonary disease, unspecified: Secondary | ICD-10-CM | POA: Diagnosis not present

## 2019-11-19 DIAGNOSIS — J45909 Unspecified asthma, uncomplicated: Secondary | ICD-10-CM | POA: Diagnosis not present

## 2019-11-19 DIAGNOSIS — Z8719 Personal history of other diseases of the digestive system: Secondary | ICD-10-CM | POA: Diagnosis not present

## 2019-11-19 MED ORDER — SODIUM CHLORIDE 0.9 % IV SOLN: 500.0000 mL | INTRAVENOUS | Status: DC

## 2019-11-19 NOTE — Patient Instructions (Signed)
Nothing by mouth until 3:00 pm, clear liquids until 4:00 pm, soft foods for the rest of today. Tomorrow you may resume normal diet.  Can use Pepcid 20 mg at night if needed for heartburn.    YOU HAD AN ENDOSCOPIC PROCEDURE TODAY AT [facility]:   Refer to the procedure report that was given to you for any specific questions about what was found during the examination.  If the procedure report does not answer your questions, please call your gastroenterologist to clarify.  If you requested that your care partner not be given the details of your procedure findings, then the procedure report has been included in a sealed envelope for you to review at your convenience later.  YOU SHOULD EXPECT: Some feelings of bloating in the abdomen. Passage of more gas than usual.  Walking can help get rid of the air that was put into your GI tract during the procedure and reduce the bloating. If you had a lower endoscopy (such as a colonoscopy or flexible sigmoidoscopy) you may notice spotting of blood in your stool or on the toilet paper. If you underwent a bowel prep for your procedure, you may not have a normal bowel movement for a few days.  Please Note:  You might notice some irritation and congestion in your nose or some drainage.  This is from the oxygen used during your procedure.  There is no need for concern and it should clear up in a day or so.  SYMPTOMS TO REPORT IMMEDIATELY:   Following lower endoscopy (colonoscopy or flexible sigmoidoscopy):  Excessive amounts of blood in the stool  Significant tenderness or worsening of abdominal pains  Swelling of the abdomen that is new, acute  Fever of 100F or higher   Following upper endoscopy (EGD)  Vomiting of blood or coffee ground material  New chest pain or pain under the shoulder blades  Painful or persistently difficult swallowing  New shortness of breath  Fever of 100F or higher  Black, tarry-looking stools  For urgent or  emergent issues, a gastroenterologist can be reached at any hour by calling [phone]. Do not use MyChart messaging for urgent concerns.    DIET:  Nothing by mouth until 3:00 pm, clear liquids until 4:00 pm, soft foods for the rest of today. Tommorrow you may proceed to your regular diet.  Drink plenty of fluids but you should avoid alcoholic beverages for 24 hours.  ACTIVITY:  You should plan to take it easy for the rest of today and you should NOT DRIVE or use heavy machinery until tomorrow (because of the sedation medicines used during the test).    FOLLOW UP: Our staff will call the number listed on your records 48-72 hours following your procedure to check on you and address any questions or concerns that you may have regarding the information given to you following your procedure. If we do not reach you, we will leave a message.  We will attempt to reach you two times.  During this call, we will ask if you have developed any symptoms of COVID 19. If you develop any symptoms (ie: fever, flu-like symptoms, shortness of breath, cough etc.) before then, please call [phone].  If you test positive for Covid 19 in the 2 weeks post procedure, please call and report this information to [name].    If any biopsies were taken you will be contacted by phone or by letter within the next 1-3 weeks.  Please call us  at [phone] if you have not heard about the biopsies in 3 weeks.    SIGNATURES/CONFIDENTIALITY: You and/or your care partner have signed paperwork which will be entered into your electronic medical record.  These signatures attest to the fact that that the information above on your After Visit Summary has been reviewed and is understood.  Full responsibility of the confidentiality of this discharge information lies with you and/or your care-partner.

## 2019-11-19 NOTE — Progress Notes (Signed)
Called to room to assist during endoscopic procedure.  Patient ID and intended procedure confirmed with present staff. Received instructions for my participation in the procedure from the performing physician.  

## 2019-11-19 NOTE — Telephone Encounter (Signed)
[name] Order: 30mg  #1 prefilled syringe Ordered date: 11/19/2019 Expected date of arrival: 3-5 business days Ordered by: Desmond Dike, [place]  Specialty Pharmacy: [facility]

## 2019-11-19 NOTE — Op Note (Signed)
[place] Patient Name: Victoria [name] Procedure Date: 11/19/2019 1:24 PM MRN: [id number] Endoscopist: Jerene Bears , MD Age: 64 Referring MD:  Date of Birth: [birth year] Gender: Female Account #: [id number] Procedure:                Colonoscopy Indications:              Screening for colorectal malignant neoplasm, Last                            colonoscopy: 2012; history of diverticulitis Medicines:                Propofol per Anesthesia Procedure:                Pre-Anesthesia Assessment:                           - Prior to the procedure, a History and Physical                            was performed, and patient medications and                            allergies were reviewed. The patient's tolerance of                            previous anesthesia was also reviewed. The risks                            and benefits of the procedure and the sedation                            options and risks were discussed with the patient.                            All questions were answered, and informed consent                            was obtained. Prior Anticoagulants: The patient has                            taken no previous anticoagulant or antiplatelet                            agents. ASA Grade Assessment: III - A patient with                            severe systemic disease. After reviewing the risks                            and benefits, the patient was deemed in                            satisfactory condition to undergo the procedure.  After obtaining informed consent, the colonoscope                            was passed under direct vision. Throughout the                            procedure, the patient's blood pressure, pulse, and                            oxygen saturations were monitored continuously. The                            Colonoscope was introduced through the anus and                            advanced to the  cecum, identified by appendiceal                            orifice and ileocecal valve. The colonoscopy was                            performed without difficulty. The patient tolerated                            the procedure well. The quality of the bowel                            preparation was good. The ileocecal valve,                            appendiceal orifice, and rectum were photographed. Scope In: 1:46:03 PM Scope Out: 1:59:14 PM Scope Withdrawal Time: 0 hours 10 minutes 55 seconds  Total Procedure Duration: 0 hours 13 minutes 11 seconds  Findings:                 Hemorrhoids were found on perianal exam.                           Many small and large-mouthed diverticula were found                            from cecum to sigmoid colon. Erythema was seen in                            association with the diverticular opening in the                            sigmoid (segment of approximately 5-7 cm). Biopsies                            were taken with a cold forceps for histology.                           Internal hemorrhoids were found during retroflexion  and during digital exam. The hemorrhoids were small.                           The exam was otherwise without abnormality. No                            polypoid lesions found. Complications:            No immediate complications. Estimated Blood Loss:     Estimated blood loss was minimal. Impression:               - Moderate diverticulosis from cecum to sigmoid                            colon. Erythema was seen in association with the                            diverticular opening in the sigmoid colon. Biopsied                            to exclude segmental colitis (SCAD).                           - Internal hemorrhoids.                           - The examination was otherwise normal. Recommendation:           - Patient has a contact number available for                             emergencies. The signs and symptoms of potential                            delayed complications were discussed with the                            patient. Return to normal activities tomorrow.                            Written discharge instructions were provided to the                            patient.                           - Resume previous diet.                           - Continue present medications.                           - Await pathology results.                           - Repeat colonoscopy in 10 years for screening  purposes. Jerene Bears, MD 11/19/2019 2:11:59 PM This report has been signed electronically.

## 2019-11-19 NOTE — Progress Notes (Signed)
Report to PACU, RN, vss, BBS= Clear.  

## 2019-11-19 NOTE — Op Note (Signed)
[place] Patient Name: Victoria [name] Procedure Date: 11/19/2019 1:25 PM MRN: [id number] Endoscopist: Jerene Bears , MD Age: 64 Referring MD:  Date of Birth: Dec 23, 1955 Gender: Female Account #: [id number] Procedure:                Upper GI endoscopy Indications:              Dysphagia, Gastro-esophageal reflux disease Medicines:                Monitored Anesthesia Care Procedure:                Pre-Anesthesia Assessment:                           - Prior to the procedure, a History and Physical                            was performed, and patient medications and                            allergies were reviewed. The patient's tolerance of                            previous anesthesia was also reviewed. The risks                            and benefits of the procedure and the sedation                            options and risks were discussed with the patient.                            All questions were answered, and informed consent                            was obtained. Prior Anticoagulants: The patient has                            taken no previous anticoagulant or antiplatelet                            agents. ASA Grade Assessment: III - A patient with                            severe systemic disease. After reviewing the risks                            and benefits, the patient was deemed in                            satisfactory condition to undergo the procedure.                           After obtaining informed consent, the endoscope was  passed under direct vision. Throughout the                            procedure, the patient's blood pressure, pulse, and                            oxygen saturations were monitored continuously. The                            Endoscope was introduced through the mouth, and                            advanced to the second part of duodenum. The upper                            GI endoscopy  was accomplished without difficulty.                            The patient tolerated the procedure well. Scope In: Scope Out: Findings:                 The distal esophagus was moderately tortuous due to                            hiatal hernia.                           A 7 cm hiatal hernia was found. The proximal extent                            of the gastric folds (end of tubular esophagus) was                            30 cm from the incisors. The hiatal narrowing was                            37 cm from the incisors.                           A low-grade of narrowing Schatzki ring was found at                            the gastroesophageal junction. A TTS dilator was                            passed through the scope. Dilation with a 16-17-18                            mm balloon dilator was performed to 18 mm.                           The entire examined stomach was normal.  A small diverticulum was found in the area of the                            papilla.                           The exam of the duodenum was otherwise normal. Complications:            No immediate complications. Estimated Blood Loss:     Estimated blood loss: none. Impression:               - Tortuous esophagus likely due to large hiatal                            hernia.                           - 7 cm hiatal hernia.                           - Low-grade of narrowing Schatzki ring. Dilated to                            18 mm with balloon.                           - Normal stomach.                           - Duodenal diverticulum with normal duodenal mucosa.                           - No specimens collected. Recommendation:           - Patient has a contact number available for                            emergencies. The signs and symptoms of potential                            delayed complications were discussed with the                            patient. Return to  normal activities tomorrow.                            Written discharge instructions were provided to the                            patient.                           - Resume previous diet.                           - Continue present medications. Continue  pantoprazole 40 mg once daily. Can use Pepcid                            (famotidine) 20 mg in the evening as needed for                            evening/nocturnal heartburn.                           - Repairing hiatal hernia can be considered for                            persistent symptoms. Jerene Bears, MD 11/19/2019 2:08:40 PM This report has been signed electronically.

## 2019-11-21 ENCOUNTER — Telehealth: Payer: Self-pay | Admitting: *Deleted

## 2019-11-21 ENCOUNTER — Telehealth: Payer: Self-pay

## 2019-11-21 NOTE — Telephone Encounter (Signed)
Returning call after her procedure. Tells me that her bottom from tooth was chipped and she is aware that is one of the risk from having the procedure done. She is scheduled to see her dentist tomorrow. Otherwise she is doing good after her procedure.

## 2019-11-21 NOTE — Telephone Encounter (Signed)
Fasenra Shipment Received:  30mg  #1 prefilled syringe Medication arrival date: 11/21/2019 Lot #: [id number] Exp date: 12/2020 Received by: Desmond Dike, [place]

## 2019-11-21 NOTE — Telephone Encounter (Signed)
First follow up call attempt.  Message left on voicemail. 

## 2019-11-21 NOTE — Telephone Encounter (Signed)
Left message on 2nd follow up call. 

## 2019-11-21 NOTE — Telephone Encounter (Signed)
Routed message to Osvaldo Angst, CRNA

## 2019-11-22 DIAGNOSIS — M542 Cervicalgia: Secondary | ICD-10-CM | POA: Diagnosis not present

## 2019-11-25 ENCOUNTER — Other Ambulatory Visit: Payer: Self-pay | Admitting: Family Medicine

## 2019-11-27 ENCOUNTER — Other Ambulatory Visit: Payer: Self-pay | Admitting: Family Medicine

## 2019-11-28 ENCOUNTER — Ambulatory Visit (INDEPENDENT_AMBULATORY_CARE_PROVIDER_SITE_OTHER): Payer: PPO

## 2019-11-28 ENCOUNTER — Other Ambulatory Visit: Payer: Self-pay

## 2019-11-28 DIAGNOSIS — J4550 Severe persistent asthma, uncomplicated: Secondary | ICD-10-CM | POA: Diagnosis not present

## 2019-11-28 MED ORDER — BENRALIZUMAB 30 MG/ML ~~LOC~~ SOSY
30.0000 mg | PREFILLED_SYRINGE | Freq: Once | SUBCUTANEOUS | Status: AC
  Administered 2019-11-28: 30 mg via SUBCUTANEOUS

## 2019-11-28 NOTE — Progress Notes (Signed)
All questions were answered by the patient before medication was administered. Have you been hospitalized in the last 10 days? No Do you have a fever? No Do you have a cough? No Do you have a headache or sore throat? No  

## 2019-12-02 NOTE — Telephone Encounter (Signed)
MR, please see pt's mychart message and advise on it for pt.

## 2019-12-02 NOTE — Telephone Encounter (Signed)
To me sounds like asthma flare up and in the past with situation like this she has had pneumonia dx as well  Plan  - can she do a  cxr 12/03/19 ? - she should also check her pulse ox at home? - I recommend perdnisone - Take prednisone 40 mg daily x 2 days, then 20mg  daily x 2 days, then 10mg  daily x 2 days, then 5mg  daily x 2 days and stop  - I  Recommend an abx -> cephalexin 500mg  three times daily x  5 days  - she should also consider a tele visit 12/03/19 with an APP    Immunization History  Administered Date(s) Administered  . Influenza Split 04/20/2011  . Influenza Whole 06/06/2007, 04/15/2008, 04/02/2009, 03/29/2012  . Influenza, High Dose Seasonal PF 05/05/2015  . Influenza,inj,Quad PF,6+ Mos 05/09/2013, 03/03/2014, 05/03/2016, 04/21/2017, 04/02/2018, 03/19/2019  . Moderna SARS-COVID-2 Vaccination 08/15/2019, 09/17/2019  . Pneumococcal Conjugate-13 05/09/2013  . Pneumococcal Polysaccharide-23 05/04/2005  . Td 07/29/2009  . Tdap 03/11/2015  . Zoster Recombinat (Shingrix) 05/09/2019, 08/01/2019    Allergies  Allergen Reactions  . Beclomethasone Dipropionate Hives and Other (See Comments)     weight gain  . Mometasone Furo-Formoterol Fum Hives and Other (See Comments)    weight gain  . Sulfonamide Derivatives Hives and Rash  . Statins     Myalgias, RLS       Current Outpatient Medications:  .  acyclovir ointment (ZOVIRAX) 5 %, Apply 1 application topically every 3 (three) hours., Disp: 15 g, Rfl: 2 .  alendronate (FOSAMAX) 70 MG tablet, TAKE 1 TABLET BY MOUTH EVERY 7 DAYS. TAKE WITH A FULL GLASS OF WATER ON AN EMPTY STOMACH., Disp: 4 tablet, Rfl: 5 .  ASHWAGANDHA PO, Take by mouth., Disp: , Rfl:  .  aspirin 81 MG tablet, Take 81 mg by mouth daily. , Disp: , Rfl:  .  Benralizumab (FASENRA) 30 MG/ML SOSY, Inject 1 mL into the skin every 8 (eight) weeks., Disp: 1 mL, Rfl: 11 .  Bioflavonoid Products (ESTER C PO), Take 500 mg by mouth daily., Disp: , Rfl:  .   busPIRone (BUSPAR) 5 MG tablet, Take 1 tablet (5 mg total) by mouth 3 (three) times daily. (Patient not taking: Reported on 10/25/2019), Disp: 45 tablet, Rfl: 1 .  Calcium-Magnesium-Vitamin D 300-150-400 MG-MG-UNIT TABS, Take 1 tablet by mouth daily., Disp: , Rfl:  .  cyclobenzaprine (FLEXERIL) 5 MG tablet, TAKE 1/2 TO 2 TABLETS BY MOUTH 2 TIMES DAILY AS NEEDED FOR MUSCLE SPASMS., Disp: 30 tablet, Rfl: 0 .  famotidine (PEPCID) 20 MG tablet, Take 1 tablet (20 mg total) by mouth at bedtime., Disp: 30 tablet, Rfl: 6 .  LORazepam (ATIVAN) 1 MG tablet, Take 0.5-1 tablets (0.5-1 mg total) by mouth 2 (two) times daily as needed for anxiety or sleep., Disp: 30 tablet, Rfl: 2 .  MAGNESIUM GLYCINATE PO, Take 400 mg by mouth daily., Disp: , Rfl:  .  Melatonin 3 MG CAPS, Take 3 mg by mouth at bedtime as needed. , Disp: , Rfl:  .  mupirocin ointment (BACTROBAN) 2 %, Place 1 application into the nose 2 (two) times daily., Disp: 22 g, Rfl: 1 .  nitroGLYCERIN (NITROSTAT) 0.4 MG SL tablet, PLACE 1 TABLET UNDER THE TONGUE EVERY 5 MINUTES AS NEEDED FOR CHEST PAIN., Disp: 25 tablet, Rfl: 4 .  pantoprazole (PROTONIX) 40 MG tablet, Take 1 tablet (40 mg total) by mouth daily., Disp: 90 tablet, Rfl: 3 .  REPATHA SURECLICK 546 MG/ML SOAJ,  INJECT 1 PEN INTO THE SKIN EVERY 14 DAYS, Disp: 6 mL, Rfl: 3 .  venlafaxine XR (EFFEXOR-XR) 150 MG 24 hr capsule, TAKE 1 CAPSULE (150 MG TOTAL) BY MOUTH DAILY., Disp: 90 capsule, Rfl: 1 .  VITAMIN D PO, Take 5,000 Units by mouth daily., Disp: , Rfl:

## 2019-12-02 NOTE — Telephone Encounter (Signed)
Pt calling in regards to this-- pt wants to add they have a hacking cough and would like a prescription for hydrocodone before heading out of town Wednesday morning.   Please call to update pt [phone]

## 2019-12-03 ENCOUNTER — Ambulatory Visit (INDEPENDENT_AMBULATORY_CARE_PROVIDER_SITE_OTHER): Payer: PPO

## 2019-12-03 ENCOUNTER — Ambulatory Visit (INDEPENDENT_AMBULATORY_CARE_PROVIDER_SITE_OTHER): Payer: PPO | Admitting: Primary Care

## 2019-12-03 ENCOUNTER — Telehealth: Payer: Self-pay | Admitting: Internal Medicine

## 2019-12-03 DIAGNOSIS — J4541 Moderate persistent asthma with (acute) exacerbation: Secondary | ICD-10-CM | POA: Diagnosis not present

## 2019-12-03 DIAGNOSIS — R05 Cough: Secondary | ICD-10-CM | POA: Diagnosis not present

## 2019-12-03 DIAGNOSIS — J4550 Severe persistent asthma, uncomplicated: Secondary | ICD-10-CM | POA: Diagnosis not present

## 2019-12-03 DIAGNOSIS — K449 Diaphragmatic hernia without obstruction or gangrene: Secondary | ICD-10-CM | POA: Diagnosis not present

## 2019-12-03 MED ORDER — CEPHALEXIN 500 MG PO CAPS: 500.0000 mg | ORAL_CAPSULE | Freq: Three times a day (TID) | ORAL | 0 refills | Status: DC

## 2019-12-03 MED ORDER — PREDNISONE 10 MG PO TABS
ORAL_TABLET | ORAL | 0 refills | Status: DC
Start: 2019-12-03 — End: 2019-12-11

## 2019-12-03 MED ORDER — AZITHROMYCIN 250 MG PO TABS: ORAL_TABLET | ORAL | 0 refills | Status: DC

## 2019-12-03 MED ORDER — HYDROCOD POLST-CPM POLST ER 10-8 MG/5ML PO SUER: 5.0000 mL | Freq: Two times a day (BID) | ORAL | 0 refills | Status: DC | PRN

## 2019-12-03 NOTE — Telephone Encounter (Signed)
Spoke with pt. She is aware of MR's response. Pt has been scheduled for a televisit with Beth today at 1100.

## 2019-12-03 NOTE — Telephone Encounter (Signed)
Guys - [facility] is busy. Is best an app reviwes cxr today and talks to her and accordinglyu does cough syrup (due to FP sign) etc., Can be a tele visit

## 2019-12-03 NOTE — Progress Notes (Signed)
Virtual Visit via Telephone Note  I connected with Victoria [name] on 12/03/19 at 11:00 AM EDT by telephone and verified that I am speaking with the correct person using two identifiers.  Location: Patient: Home Provider: Office   I discussed the limitations, risks, security and privacy concerns of performing an evaluation and management service by telephone and the availability of in person appointments. I also discussed with the patient that there may be a patient responsible charge related to this service. The patient expressed understanding and agreed to proceed.  Summary: Severe persistent asthma with ABPA elevated IgE and eosinophilia. S/P cinqair interleukin-5 receptor antibody treatment ending" 2018 with good response but stopped secondary to billing errors at [facility] infusion center. Based on medical need then switched to anti-eosinophil interleukin-5 receptor antibody subcutaneous injection treatment Fasenra early 2019. She is  NOT on baseline Asmanex, Spiriva, Singulair and antihistamine 8. She is not on daily prednisone anymore  History of Present Illness: 64 year old female. PMH severe persistent eosinophilic asthma. Patient of Dr. Chase Caller. Maintained on Fasenra 30mg  q 8 weeks. She is not on daily maintenance inhaler or daily prednisone.   12/03/2019 Patient contacted today for acute televisit. She an an endoscopy June 1st. She developed scratchy throat and cough. Her cough was originally productive with clear mucus, it has now turned to a dry cough. She developed a lot of post nasal drip and sinus congestion. She had chest tightness in her chest over the weekend which has since resolved. She still has a residual cough and can not get any relief. States that she does not feel that bad, she would normally push through but she is going out of town with her son. She is nervous to take any medication that may cause diarrhea. States that she knowns Azithromycin agrees with her. She is on  Fasenra q8 weeks, received injection last week. She has had both covid vaccines, received her second dose end of March. Denies fever, sweats, N/V/D.   Observations/Objective:  - Appears well, no overt shortness of breath or wheezing - Coughing when speaking  Imaging:  CXR 12/03/2019 no evidence of focal infiltrate or effusion.   Assessment and Plan:  Asthmatic bronchitis with acute exacerbation: - Reports dry cough/URI symptoms with associated chest tightness x1-2 weeks  - CXR today showed no evidence of focal infiltrate or pleural effusion - Changing Keflex to Zpack d/t potential GI upset - Rx prednisone taper sent to pharmacy per Dr. Judd Gaudier; Tussionex 66ml q12 hours for 1 week (PMP reviewed, no unexpected prescription found, reviewed safe use and no refills)  Follow Up Instructions:  - As needed if symptoms do not improve or worsen    I discussed the assessment and treatment plan with the patient. The patient was provided an opportunity to ask questions and all were answered. The patient agreed with the plan and demonstrated an understanding of the instructions.   The patient was advised to call back or seek an in-person evaluation if the symptoms worsen or if the condition fails to improve as anticipated.  I provided 18 minutes of non-face-to-face time during this encounter.   Martyn Ehrich, NP

## 2019-12-03 NOTE — Telephone Encounter (Signed)
Pt called back, please return call  

## 2019-12-03 NOTE — Telephone Encounter (Signed)
LMTCB x1 for pt.  

## 2019-12-03 NOTE — Telephone Encounter (Signed)
Brand Males, MD     5:40 PM Note  To me sounds like asthma flare up and in the past with situation like this she has had pneumonia dx as well  Plan  - can she do a  cxr 12/03/19 ? - she should also check her pulse ox at home? - I recommend perdnisone - Take prednisone 40 mg daily x 2 days, then 20mg  daily x 2 days, then 10mg  daily x 2 days, then 5mg  daily x 2 days and stop  - I  Recommend an abx -> cephalexin 500mg  three times daily x  5 days  - she should also consider a tele visit 12/03/19 with an APP     ---------------------------------------------------------------------------- Spoke with pt. She is aware of Dr. Golden Pop response. Rxs have been sent in and CXR order has been placed. Pt would like to know if MR would give her some cough syrup. Her and her family are taking a trip to [facility] tomorrow and wants to have this on hand.  Dr. Chase Caller - please advise. Thanks.

## 2019-12-03 NOTE — Patient Instructions (Signed)
Asthmatic bronchitis:  Change Keflex to Zpack Sending in prednisone taper, take as directed Tussionex 52ml twice daily for cough CXR no evidence of pneumonia or pleural effusion

## 2019-12-10 NOTE — Progress Notes (Signed)
NEUROLOGY CONSULTATION NOTE  [name] MRN: [id number] DOB: Nov 11, 1955  Referring provider: [name], MD Primary care provider: [name], MD  Reason for consult:  headache  HISTORY OF PRESENT ILLNESS: Victoria [name] is a 64 year old right-handed Caucasian female with CAD, IBS, HLD, and COPD who presents for headaches.  History supplemented by referring provider's notes.  In June 2020, she woke up with left sided neck pain and left occipital burning pain and pressure radiating to the the posterior temporal region.  No pain, numbness or weakness down the arm and hand.  Sometimes, she would have diffuse pressure in her head and tenderness around her left ear.  MRI of cervical spine showed degenerative changes but no significant canal or neural foraminal stenosis.  She was evaluated by Dr. Krista Blue at [facility] in January, who performed an occipital nerve block that was effective.  She underwent physical therapy and massage therapy on her neck and shoulders.  She also has tried Flexeril and used CBD oil.  She also noted a buzzing in her head fluctuating in volume and sometime lateralizing to left ear.  She saw ENT and workup was unremarkable.  MRI of brain and IAC was negative.  At this time, she is doing well.  No headache and neck pain is controlled.  Imaging (personally reviewed): 03/30/2019 MRI BRAIN/IAC W WO:  Negative exam.  No acute or focal intracranial abnormality. 06/08/2019 MRI CERVICAL SPINE WO:  FINDINGS: Alignment: Mild cervical spine straightening. No significant Listhesis.  Vertebrae: No fracture, suspicious osseous lesion, or significant marrow edema. Mild disc space narrowing from C2-3 to C6-7 with mild degenerative endplate changes.  Cord: Normal signal and morphology.  Posterior Fossa, vertebral arteries, paraspinal tissues:  Unremarkable.  Disc levels:  C2-3: Mild disc bulging, mild uncovertebral spurring, and mild right facet arthrosis without  stenosis.  C3-4: Disc bulging, uncovertebral spurring, and mild right and mild-to-moderate left facet arthrosis result in moderate right neural foraminal stenosis without spinal stenosis.  C4-5: Mild disc bulging, mild uncovertebral spurring, and mild-to-moderate facet arthrosis without significant stenosis.  C5-6: Mild disc bulging and small right paracentral disc protrusion without stenosis.  C6-7: Disc bulging and asymmetric right uncovertebral spurring result in mild right neural foraminal stenosis and borderline spinal stenosis.  C7-T1: Mild uncovertebral spurring and mild-to-moderate facet arthrosis without stenosis.  IMPRESSION:  1. Diffuse cervical disc degeneration without significant spinal Stenosis. 2. Moderate right neural foraminal stenosis at C3-4 and mild right neural foraminal stenosis at C6-7.   PAST MEDICAL HISTORY: Past Medical History:  Diagnosis Date  . 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 Anemia    iron deficiency, resolved  . Anxiety   . Asthma   . CAD (coronary artery disease)    a. LHC 6/16:  oOM1 60, pRCA 25 >> med Rx b. cath 3/19 2nd OM with 95% stenosis s/p synergy DES & anomalous RCA  . CAP (community acquired pneumonia) 2016; 06/07/2016  . Chronic bronchitis ([place])   . Chronic lower back pain   . Complication of anesthesia    "think I have a hard time waking up from it"  . COPD (chronic obstructive pulmonary disease) ([place])   . Depression    mild  . Diverticulitis   . Diverticulosis   . GERD (gastroesophageal reflux disease)   . H/O hiatal hernia   . Headache    "weekly" (08/23/2017)  . History of echocardiogram    Echo 6/16:  Mod LVH, EF 60-65%, no RWMA, Gr 1 DD, trivial MR, normal LA size.  Marland Kitchen Hyperglycemia 11/20/2015  . Hyperlipidemia, mixed 09/11/2007   Qualifier: Diagnosis of  By: Jerold Coombe   Did not tolerate Lipitor, zocor, Lovastatin, Pravastatin, Livalo, Crestor even low dose   . IBS  (irritable bowel syndrome)   . Maxillary sinusitis   . Normal cardiac stress test 11/2011   No evidence of ischemia or infarct. Calculated ejection fraction 72%.  . Obesity   . OSA (obstructive sleep apnea) 02/2012   has stopped using  cpap  . Osteoarthritis   . Osteoporosis   . Pneumonia 11/2011   "before 2013 I hadn't had pneumonia since I was a child" (04/13/2012)  . Pulmonary nodules   . S/P angioplasty with stent 08/23/17 ostial 2nd OM with DES synnergy 08/24/2017  . Schatzki's ring     PAST SURGICAL HISTORY: Past Surgical History:  Procedure Laterality Date  . APPENDECTOMY  1989  . CARDIAC CATHETERIZATION N/A 11/25/2014   Procedure: Right/Left Heart Cath and Coronary Angiography;  Surgeon: Belva Crome, MD;  Location: [facility];  Service: Cardiovascular;  Laterality: N/A;  . CESAREAN SECTION  1985  . CORONARY ANGIOPLASTY WITH STENT PLACEMENT  08/23/2017  . CORONARY STENT INTERVENTION N/A 08/23/2017   Procedure: CORONARY STENT INTERVENTION;  Surgeon: Burnell Blanks, MD;  Location: [facility];  Service: Cardiovascular;  Laterality: N/A;  . HERNIA REPAIR  04/13/2012   VHR laparoscopic  . LEFT HEART CATH AND CORONARY ANGIOGRAPHY N/A 08/23/2017   Procedure: LEFT HEART CATH AND CORONARY ANGIOGRAPHY;  Surgeon: Burnell Blanks, MD;  Location: [facility];  Service: Cardiovascular;  Laterality: N/A;  . VENTRAL HERNIA REPAIR  04/13/2012   Procedure: LAPAROSCOPIC VENTRAL HERNIA;  Surgeon: Adin Hector, MD;  Location: [facility];  Service: General;  Laterality: N/A;  laparoscopic repair of incarcerated hernia    MEDICATIONS: Current Outpatient Medications on File Prior to Visit  Medication Sig Dispense Refill  . acyclovir ointment (ZOVIRAX) 5 % Apply 1 application topically every 3 (three) hours. (Patient not taking: Reported on 12/03/2019) 15 g 2  . alendronate (FOSAMAX) 70 MG tablet TAKE 1 TABLET BY MOUTH EVERY 7 DAYS. TAKE WITH A FULL GLASS OF WATER ON  AN EMPTY STOMACH. 4 tablet 5  . ASHWAGANDHA PO Take by mouth.    Marland Kitchen aspirin 81 MG tablet Take 81 mg by mouth daily.     Marland Kitchen azithromycin (ZITHROMAX) 250 MG tablet Zpack taper as directed 6 tablet 0  . Benralizumab (FASENRA) 30 MG/ML SOSY Inject 1 mL into the skin every 8 (eight) weeks. 1 mL 11  . Bioflavonoid Products (ESTER C PO) Take 500 mg by mouth daily.    . busPIRone (BUSPAR) 5 MG tablet Take 1 tablet (5 mg total) by mouth 3 (three) times daily. (Patient not taking: Reported on 12/03/2019) 45 tablet 1  . Calcium-Magnesium-Vitamin D 300-150-400 MG-MG-UNIT TABS Take 1 tablet by mouth daily.    . chlorpheniramine-HYDROcodone (TUSSIONEX PENNKINETIC ER) 10-8 MG/5ML SUER Take 5 mLs by mouth every 12 (twelve) hours as needed for cough. 70 mL 0  . cyclobenzaprine (FLEXERIL) 5 MG tablet TAKE 1/2 TO 2 TABLETS BY MOUTH 2 TIMES DAILY AS NEEDED FOR MUSCLE SPASMS. 30 tablet 0  . famotidine (PEPCID) 20 MG tablet Take 1 tablet (20 mg total) by mouth at bedtime. 30 tablet 6  . LORazepam (ATIVAN) 1 MG tablet Take 0.5-1 tablets (0.5-1 mg total) by mouth 2 (  two) times daily as needed for anxiety or sleep. 30 tablet 2  . MAGNESIUM GLYCINATE PO Take 400 mg by mouth daily.    . Melatonin 3 MG CAPS Take 3 mg by mouth at bedtime as needed.     . mupirocin ointment (BACTROBAN) 2 % Place 1 application into the nose 2 (two) times daily. 22 g 1  . nitroGLYCERIN (NITROSTAT) 0.4 MG SL tablet PLACE 1 TABLET UNDER THE TONGUE EVERY 5 MINUTES AS NEEDED FOR CHEST PAIN. 25 tablet 4  . pantoprazole (PROTONIX) 40 MG tablet Take 1 tablet (40 mg total) by mouth daily. 90 tablet 3  . predniSONE (DELTASONE) 10 MG tablet Take 40 mg daily x 2 days, then 20mg  daily x 2 days, then 10mg  daily x 2 days, then 5mg  daily x 2 days and stop 15 tablet 0  . REPATHA SURECLICK 097 MG/ML SOAJ INJECT 1 PEN INTO THE SKIN EVERY 14 DAYS 6 mL 3  . venlafaxine XR (EFFEXOR-XR) 150 MG 24 hr capsule TAKE 1 CAPSULE (150 MG TOTAL) BY MOUTH DAILY. 90 capsule 1  .  VITAMIN D PO Take 5,000 Units by mouth daily.     No current facility-administered medications on file prior to visit.    ALLERGIES: Allergies  Allergen Reactions  . Beclomethasone Dipropionate Hives and Other (See Comments)     weight gain  . Mometasone Furo-Formoterol Fum Hives and Other (See Comments)    weight gain  . Sulfonamide Derivatives Hives and Rash  . Statins     Myalgias, RLS    FAMILY HISTORY: Family History  Problem Relation Age of Onset  . Breast cancer Mother   . Hypertension Mother   . Diabetes Mother   . Diverticulosis Father   . Prostate cancer Father   . Pulmonary embolism Brother        recurrent  . Heart attack Maternal Grandfather   . Breast cancer Sister   . Stroke Neg Hx   . Colon cancer Neg Hx   . Esophageal cancer Neg Hx   . Stomach cancer Neg Hx   . Rectal cancer Neg Hx     SOCIAL HISTORY: Social History   Socioeconomic History  . Marital status: Married    Spouse name: Not on file  . Number of children: 1  . Years of education: 62  . Highest education level: Associate degree: academic program  Occupational History  . Occupation: Disabled   Tobacco Use  . Smoking status: Never Smoker  . Smokeless tobacco: Never Used  Vaping Use  . Vaping Use: Never used  Substance and Sexual Activity  . Alcohol use: Yes    Comment: social use  . Drug use: No  . Sexual activity: Yes    Comment: gluten free, lives with husband and son with CP quadriplegia  Other Topics Concern  . 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    Lives at home with her husband and son.   Right-handed.   2 cups caffeine per day.   Social Determinants of Health   Financial Resource Strain:   . Difficulty of Paying Living Expenses:   Food Insecurity:   . Worried About Charity fundraiser in the Last Year:   . Arboriculturist in the Last Year:   Transportation Needs:   . Film/video editor (Medical):   Marland Kitchen Lack of  Transportation (Non-Medical):   Physical Activity:   . Days of Exercise per  Week:   . Minutes of Exercise per Session:   Stress:   . Feeling of Stress :   Social Connections:   . Frequency of Communication with Friends and Family:   . Frequency of Social Gatherings with Friends and Family:   . Attends Religious Services:   . Active Member of Clubs or Organizations:   . Attends Archivist Meetings:   Marland Kitchen Marital Status:   Intimate Partner Violence:   . Fear of Current or Ex-Partner:   . Emotionally Abused:   Marland Kitchen Physically Abused:   . Sexually Abused:     REVIEW OF SYSTEMS: Constitutional: No fevers, chills, or sweats, no generalized fatigue, change in appetite Eyes: No visual changes, double vision, eye pain Ear, nose and throat: No hearing loss, ear pain, nasal congestion, sore throat Cardiovascular: No chest pain, palpitations Respiratory:  No shortness of breath at rest or with exertion, wheezes GastrointestinaI: No nausea, vomiting, diarrhea, abdominal pain, fecal incontinence Genitourinary:  No dysuria, urinary retention or frequency Musculoskeletal:  No neck pain, back pain Integumentary: No rash, pruritus, skin lesions Neurological: as above Psychiatric: No depression, insomnia, anxiety Endocrine: No palpitations, fatigue, diaphoresis, mood swings, change in appetite, change in weight, increased thirst Hematologic/Lymphatic:  No purpura, petechiae. Allergic/Immunologic: no itchy/runny eyes, nasal congestion, recent allergic reactions, rashes  PHYSICAL EXAM: Blood pressure 115/78, pulse 81, resp. rate 18, height 5\' 2"  (1.575 m), weight 153 lb (69.4 kg), SpO2 100 %. General: No acute distress.  Patient appears well-groomed.   Head:  Normocephalic/atraumatic Eyes:  fundi examined but not visualized Neck: supple, no paraspinal tenderness, full range of motion Back: No paraspinal tenderness Heart: regular rate and rhythm Lungs: Clear to auscultation  bilaterally. Vascular: No carotid bruits. Neurological Exam: Mental status: alert and oriented to person, place, and time, recent and remote memory intact, fund of knowledge intact, attention and concentration intact, speech fluent and not dysarthric, language intact. Cranial nerves: CN I: not tested CN II: pupils equal, round and reactive to light, visual fields intact CN III, IV, VI:  full range of motion, no nystagmus, no ptosis CN V: facial sensation intact CN VII: upper and lower face symmetric CN VIII: hearing intact CN IX, X: gag intact, uvula midline CN XI: sternocleidomastoid and trapezius muscles intact CN XII: tongue midline Bulk & Tone: normal, no fasciculations. Motor:  5/5 throughout  Sensation:  Pinprick and vibration sensation intact. Deep Tendon Reflexes:  2+ throughout, toes downgoing.  Finger to nose testing:  Without dysmetria.  Heel to shin:  Without dysmetria.  Gait:  Normal station and stride.  Romberg negative.  IMPRESSION: 1.  Probable left occipital neuralgia and left sided cervical myofascial pain syndrome, resolved with occipital nerve block and conservative management for cervicalgia.   2.  Tinnitus, negative workup.  PLAN: Symptoms are currently controlled.  Continue current management.  Follow up as needed.  Thank you for allowing me to take part in the care of this patient.  Metta Clines, DO  CC: [name], MD

## 2019-12-11 ENCOUNTER — Ambulatory Visit: Payer: PPO | Admitting: Neurology

## 2019-12-11 ENCOUNTER — Encounter: Payer: Self-pay | Admitting: Neurology

## 2019-12-11 ENCOUNTER — Other Ambulatory Visit: Payer: Self-pay

## 2019-12-11 VITALS — BP 115/78 | HR 81 | Resp 18 | Ht 62.0 in | Wt 153.0 lb

## 2019-12-11 DIAGNOSIS — H9313 Tinnitus, bilateral: Secondary | ICD-10-CM | POA: Diagnosis not present

## 2019-12-11 DIAGNOSIS — M5481 Occipital neuralgia: Secondary | ICD-10-CM

## 2019-12-11 DIAGNOSIS — M7918 Myalgia, other site: Secondary | ICD-10-CM

## 2019-12-11 NOTE — Patient Instructions (Signed)
I think you had occipital neuralgia with associated neck pain.  Continue current management.  Follow up as needed.

## 2020-01-02 ENCOUNTER — Telehealth: Payer: PPO | Admitting: Family Medicine

## 2020-01-07 DIAGNOSIS — H2513 Age-related nuclear cataract, bilateral: Secondary | ICD-10-CM | POA: Diagnosis not present

## 2020-01-10 ENCOUNTER — Other Ambulatory Visit: Payer: Self-pay

## 2020-01-10 ENCOUNTER — Telehealth (INDEPENDENT_AMBULATORY_CARE_PROVIDER_SITE_OTHER): Payer: PPO | Admitting: Family Medicine

## 2020-01-10 DIAGNOSIS — Z789 Other specified health status: Secondary | ICD-10-CM

## 2020-01-10 DIAGNOSIS — T466X5A Adverse effect of antihyperlipidemic and antiarteriosclerotic drugs, initial encounter: Secondary | ICD-10-CM

## 2020-01-10 DIAGNOSIS — G4700 Insomnia, unspecified: Secondary | ICD-10-CM | POA: Diagnosis not present

## 2020-01-10 DIAGNOSIS — E782 Mixed hyperlipidemia: Secondary | ICD-10-CM | POA: Diagnosis not present

## 2020-01-10 DIAGNOSIS — F419 Anxiety disorder, unspecified: Secondary | ICD-10-CM

## 2020-01-10 DIAGNOSIS — K219 Gastro-esophageal reflux disease without esophagitis: Secondary | ICD-10-CM

## 2020-01-10 DIAGNOSIS — I5032 Chronic diastolic (congestive) heart failure: Secondary | ICD-10-CM

## 2020-01-10 DIAGNOSIS — G720 Drug-induced myopathy: Secondary | ICD-10-CM

## 2020-01-10 DIAGNOSIS — R739 Hyperglycemia, unspecified: Secondary | ICD-10-CM | POA: Diagnosis not present

## 2020-01-10 DIAGNOSIS — F329 Major depressive disorder, single episode, unspecified: Secondary | ICD-10-CM

## 2020-01-10 NOTE — Assessment & Plan Note (Signed)
hgba1c acceptable, minimize simple carbs. Increase exercise as tolerated.  

## 2020-01-10 NOTE — Assessment & Plan Note (Signed)
Patient on repatha as she did not tolerate statins

## 2020-01-10 NOTE — Assessment & Plan Note (Signed)
She has seen Dr Hilarie Fredrickson and they have started Famotidine and Pantoprazole qhs she is feeling much better. She also notes when she took Melatonin to 3 mg this worsened the reflux. Off of it she has improved

## 2020-01-10 NOTE — Assessment & Plan Note (Signed)
Patient unable to tolerate statins

## 2020-01-10 NOTE — Progress Notes (Signed)
Virtual Visit via Video Note  I connected with Victoria [name] on 01/10/20 at  8:40 AM EDT by a video enabled telemedicine application and verified that I am speaking with the correct person using two identifiers.  Location: Patient: home, patient and provider in visit Provider: home   I discussed the limitations of evaluation and management by telemedicine and the availability of in person appointments. The patient expressed understanding and agreed to proceed. Kem Boroughs, CMA was able to get the patient set upon a video visit    Subjective:    Patient ID: Victoria [name], female    DOB: [birth year], 64 y.o.   MRN: [id number]  Chief Complaint  Patient presents with  . Follow-up    HPI Patient is in today for follow up on chronic medical concerns. No recent febrile illness or hospitalizations. She is trying to maintain a heart healthy diet and stay active but she spends a good deal of her time caring for her severely disabled special needs adult son.   Past Medical History:  Diagnosis Date  . Allergic bronchopulmonary aspergillosis ([place]) 2008   sees Dr Edmund Hilda pulmonology  . Anemia    iron deficiency, resolved  . Anxiety   . Asthma   . CAD (coronary artery disease)    a. LHC 6/16:  oOM1 60, pRCA 25 >> med Rx b. cath 3/19 2nd OM with 95% stenosis s/p synergy DES & anomalous RCA  . CAP (community acquired pneumonia) 2016; 06/07/2016  . Chronic bronchitis ([place])   . Chronic lower back pain   . Complication of anesthesia    "think I have a hard time waking up from it"  . COPD (chronic obstructive pulmonary disease) ([place])   . Depression    mild  . Diverticulitis   . Diverticulosis   . GERD (gastroesophageal reflux disease)   . H/O hiatal hernia   . Headache    "weekly" (08/23/2017)  . History of echocardiogram    Echo 6/16:  Mod LVH, EF 60-65%, no RWMA, Gr 1 DD, trivial MR, normal LA size.  Marland Kitchen Hyperglycemia 11/20/2015  . Hyperlipidemia, mixed 09/11/2007    Qualifier: Diagnosis of  By: Jerold Coombe   Did not tolerate Lipitor, zocor, Lovastatin, Pravastatin, Livalo, Crestor even low dose   . IBS (irritable bowel syndrome)   . Maxillary sinusitis   . Normal cardiac stress test 11/2011   No evidence of ischemia or infarct. Calculated ejection fraction 72%.  . Obesity   . OSA (obstructive sleep apnea) 02/2012   has stopped using  cpap  . Osteoarthritis   . Osteoporosis   . Pneumonia 11/2011   "before 2013 I hadn't had pneumonia since I was a child" (04/13/2012)  . Pulmonary nodules   . S/P angioplasty with stent 08/23/17 ostial 2nd OM with DES synnergy 08/24/2017  . Schatzki's ring     Past Surgical History:  Procedure Laterality Date  . APPENDECTOMY  1989  . CARDIAC CATHETERIZATION N/A 11/25/2014   Procedure: Right/Left Heart Cath and Coronary Angiography;  Surgeon: Belva Crome, MD;  Location: [facility];  Service: Cardiovascular;  Laterality: N/A;  . CESAREAN SECTION  1985  . CORONARY ANGIOPLASTY WITH STENT PLACEMENT  08/23/2017  . CORONARY STENT INTERVENTION N/A 08/23/2017   Procedure: CORONARY STENT INTERVENTION;  Surgeon: Burnell Blanks, MD;  Location: [facility];  Service: Cardiovascular;  Laterality: N/A;  . HERNIA REPAIR  04/13/2012   VHR laparoscopic  . LEFT HEART CATH  AND CORONARY ANGIOGRAPHY N/A 08/23/2017   Procedure: LEFT HEART CATH AND CORONARY ANGIOGRAPHY;  Surgeon: Burnell Blanks, MD;  Location: [facility];  Service: Cardiovascular;  Laterality: N/A;  . VENTRAL HERNIA REPAIR  04/13/2012   Procedure: LAPAROSCOPIC VENTRAL HERNIA;  Surgeon: Adin Hector, MD;  Location: [facility];  Service: General;  Laterality: N/A;  laparoscopic repair of incarcerated hernia    Family History  Problem Relation Age of Onset  . Breast cancer Mother   . Hypertension Mother   . Diabetes Mother   . Diverticulosis Father   . Prostate cancer Father   . Pulmonary embolism Brother        recurrent  . Heart  attack Maternal Grandfather   . Breast cancer Sister   . Stroke Neg Hx   . Colon cancer Neg Hx   . Esophageal cancer Neg Hx   . Stomach cancer Neg Hx   . Rectal cancer Neg Hx     Social History   Socioeconomic History  . Marital status: Married    Spouse name: Not on file  . Number of children: 1  . Years of education: 18  . Highest education level: Associate degree: academic program  Occupational History  . Occupation: Disabled   Tobacco Use  . Smoking status: Never Smoker  . Smokeless tobacco: Never Used  Vaping Use  . Vaping Use: Never used  Substance and Sexual Activity  . Alcohol use: Yes    Comment: social use  . Drug use: No  . Sexual activity: Yes    Comment: gluten free, lives with husband and son with CP quadriplegia  Other Topics Concern  . 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    Lives at home with her husband and son.   Right-handed.   2 cups caffeine per day.   One story home   Social Determinants of Health   Financial Resource Strain:   . Difficulty of Paying Living Expenses:   Food Insecurity:   . Worried About Charity fundraiser in the Last Year:   . Arboriculturist in the Last Year:   Transportation Needs:   . Film/video editor (Medical):   Marland Kitchen Lack of Transportation (Non-Medical):   Physical Activity:   . Days of Exercise per Week:   . Minutes of Exercise per Session:   Stress:   . Feeling of Stress :   Social Connections:   . Frequency of Communication with Friends and Family:   . Frequency of Social Gatherings with Friends and Family:   . Attends Religious Services:   . Active Member of Clubs or Organizations:   . Attends Archivist Meetings:   Marland Kitchen Marital Status:   Intimate Partner Violence:   . Fear of Current or Ex-Partner:   . Emotionally Abused:   Marland Kitchen Physically Abused:   . Sexually Abused:     Outpatient Medications Prior to Visit  Medication Sig Dispense Refill  . acyclovir  ointment (ZOVIRAX) 5 % Apply 1 application topically every 3 (three) hours. 15 g 2  . alendronate (FOSAMAX) 70 MG tablet TAKE 1 TABLET BY MOUTH EVERY 7 DAYS. TAKE WITH A FULL GLASS OF WATER ON AN EMPTY STOMACH. 4 tablet 5  . ASHWAGANDHA PO Take by mouth.    Marland Kitchen aspirin 81 MG tablet Take 81 mg by mouth daily.     . Benralizumab (FASENRA) 30 MG/ML SOSY Inject 1 mL  into the skin every 8 (eight) weeks. 1 mL 11  . Bioflavonoid Products (ESTER C PO) Take 500 mg by mouth daily.    . Calcium-Magnesium-Vitamin D 300-150-400 MG-MG-UNIT TABS Take 1 tablet by mouth daily.    . famotidine (PEPCID) 20 MG tablet Take 1 tablet (20 mg total) by mouth at bedtime. 30 tablet 6  . LORazepam (ATIVAN) 1 MG tablet Take 0.5-1 tablets (0.5-1 mg total) by mouth 2 (two) times daily as needed for anxiety or sleep. 30 tablet 2  . MAGNESIUM GLYCINATE PO Take 400 mg by mouth daily.    . mupirocin ointment (BACTROBAN) 2 % Place 1 application into the nose 2 (two) times daily. 22 g 1  . nitroGLYCERIN (NITROSTAT) 0.4 MG SL tablet PLACE 1 TABLET UNDER THE TONGUE EVERY 5 MINUTES AS NEEDED FOR CHEST PAIN. 25 tablet 4  . pantoprazole (PROTONIX) 40 MG tablet Take 1 tablet (40 mg total) by mouth daily. 90 tablet 3  . REPATHA SURECLICK 867 MG/ML SOAJ INJECT 1 PEN INTO THE SKIN EVERY 14 DAYS 6 mL 3  . venlafaxine XR (EFFEXOR-XR) 150 MG 24 hr capsule TAKE 1 CAPSULE (150 MG TOTAL) BY MOUTH DAILY. 90 capsule 1  . VITAMIN D PO Take 5,000 Units by mouth daily.    Marland Kitchen azithromycin (ZITHROMAX) 250 MG tablet Zpack taper as directed (Patient not taking: Reported on 12/11/2019) 6 tablet 0  . chlorpheniramine-HYDROcodone (TUSSIONEX PENNKINETIC ER) 10-8 MG/5ML SUER Take 5 mLs by mouth every 12 (twelve) hours as needed for cough. (Patient not taking: Reported on 12/11/2019) 70 mL 0  . cyclobenzaprine (FLEXERIL) 5 MG tablet TAKE 1/2 TO 2 TABLETS BY MOUTH 2 TIMES DAILY AS NEEDED FOR MUSCLE SPASMS. (Patient not taking: Reported on 12/11/2019) 30 tablet 0  .  Melatonin 3 MG CAPS Take 3 mg by mouth at bedtime as needed.      No facility-administered medications prior to visit.    Allergies  Allergen Reactions  . Beclomethasone Dipropionate Hives and Other (See Comments)     weight gain  . Flexeril [Cyclobenzaprine] Anxiety  . Mometasone Furo-Formoterol Fum Hives and Other (See Comments)    weight gain  . Sulfonamide Derivatives Hives and Rash  . Statins     Myalgias, RLS    Review of Systems  Constitutional: Negative for fever and malaise/fatigue.  HENT: Negative for congestion.   Eyes: Negative for blurred vision.  Respiratory: Negative for shortness of breath.   Cardiovascular: Negative for chest pain, palpitations and leg swelling.  Gastrointestinal: Positive for heartburn. Negative for abdominal pain, blood in stool and nausea.  Genitourinary: Negative for dysuria and frequency.  Musculoskeletal: Negative for falls.  Skin: Negative for rash.  Neurological: Negative for dizziness, loss of consciousness and headaches.  Endo/Heme/Allergies: Negative for environmental allergies.  Psychiatric/Behavioral: Negative for depression. The patient has insomnia. The patient is not nervous/anxious.        Objective:    Physical Exam Constitutional:      Appearance: Normal appearance. She is not ill-appearing.  HENT:     Head: Normocephalic and atraumatic.     Right Ear: External ear normal.     Left Ear: External ear normal.     Nose: Nose normal.  Pulmonary:     Effort: Pulmonary effort is normal.  Neurological:     Mental Status: She is alert and oriented to person, place, and time.  Psychiatric:        Behavior: Behavior normal.     BP 100/82  Pulse 84   Ht 5\' 1"  (1.549 m)   Wt 148 lb (67.1 kg)   BMI 27.96 kg/m  Wt Readings from Last 3 Encounters:  01/10/20 148 lb (67.1 kg)  12/11/19 153 lb (69.4 kg)  11/19/19 157 lb (71.2 kg)    Diabetic Foot Exam - Simple   No data filed     Lab Results  Component Value Date    WBC 5.4 05/29/2019   HGB 13.5 05/29/2019   HCT 40.8 05/29/2019   PLT 291.0 05/29/2019   GLUCOSE 93 05/29/2019   CHOL 141 03/11/2019   TRIG 93.0 03/11/2019   HDL 56.10 03/11/2019   LDLDIRECT 177.8 09/07/2011   LDLCALC 66 03/11/2019   ALT 16 05/29/2019   AST 15 05/29/2019   NA 137 05/29/2019   K 3.8 05/29/2019   CL 101 05/29/2019   CREATININE 0.62 05/29/2019   BUN 13 05/29/2019   CO2 29 05/29/2019   TSH 2.24 03/11/2019   INR 1.0 08/21/2017   HGBA1C 5.7 03/11/2019    Lab Results  Component Value Date   TSH 2.24 03/11/2019   Lab Results  Component Value Date   WBC 5.4 05/29/2019   HGB 13.5 05/29/2019   HCT 40.8 05/29/2019   MCV 90.9 05/29/2019   PLT 291.0 05/29/2019   Lab Results  Component Value Date   NA 137 05/29/2019   K 3.8 05/29/2019   CO2 29 05/29/2019   GLUCOSE 93 05/29/2019   BUN 13 05/29/2019   CREATININE 0.62 05/29/2019   BILITOT 0.4 05/29/2019   ALKPHOS 77 05/29/2019   AST 15 05/29/2019   ALT 16 05/29/2019   PROT 7.0 05/29/2019   ALBUMIN 4.4 05/29/2019   CALCIUM 9.3 05/29/2019   ANIONGAP 6 08/24/2017   GFR 97.52 05/29/2019   Lab Results  Component Value Date   CHOL 141 03/11/2019   Lab Results  Component Value Date   HDL 56.10 03/11/2019   Lab Results  Component Value Date   LDLCALC 66 03/11/2019   Lab Results  Component Value Date   TRIG 93.0 03/11/2019   Lab Results  Component Value Date   CHOLHDL 3 03/11/2019   Lab Results  Component Value Date   HGBA1C 5.7 03/11/2019       Assessment & Plan:   Problem List Items Addressed This Visit    Hyperglycemia (Chronic)    hgba1c acceptable, minimize simple carbs. Increase exercise as tolerated.       Relevant Orders   Hemoglobin A1c   Comprehensive metabolic panel   TSH   Hyperlipidemia, mixed    Encouraged heart healthy diet, increase exercise, avoid trans fats, consider a krill oil cap daily      Relevant Orders   Lipid panel   Acid reflux    She has seen Dr  Hilarie Fredrickson and they have started Famotidine and Pantoprazole qhs she is feeling much better. She also notes when she took Melatonin to 3 mg this worsened the reflux. Off of it she has improved      Chronic diastolic CHF (congestive heart failure), NYHA class 2 (HCC)    Asymptomatic, no changes to therapy      Anxiety and depression    She notes she feels the Venlafaxine is working well and her Anxiety has resolved since stopping the Flexeril      Insomnia    Quit melatonin due to side effects. Lorazepam 0.5 mg qhs has been helpful      Statin myopathy  Patient on repatha as she did not tolerate statins      Relevant Orders   CBC   Statin intolerance    Patient unable to tolerate statins.          I have discontinued Victoria [name]'s Melatonin, cyclobenzaprine, chlorpheniramine-HYDROcodone, and azithromycin. I am also having her maintain her aspirin, Calcium-Magnesium-Vitamin D, acyclovir ointment, Bioflavonoid Products (ESTER C PO), nitroGLYCERIN, mupirocin ointment, alendronate, VITAMIN D PO, Fasenra, MAGNESIUM GLYCINATE PO, ASHWAGANDHA PO, LORazepam, Repatha SureClick, pantoprazole, famotidine, and venlafaxine XR.  No orders of the defined types were placed in this encounter.  I discussed the assessment and treatment plan with the patient. The patient was provided an opportunity to ask questions and all were answered. The patient agreed with the plan and demonstrated an understanding of the instructions.   The patient was advised to call back or seek an in-person evaluation if the symptoms worsen or if the condition fails to improve as anticipated.  I provided 20 minutes of non-face-to-face time during this encounter.   [name], MD

## 2020-01-10 NOTE — Assessment & Plan Note (Signed)
Asymptomatic, no changes to therapy

## 2020-01-10 NOTE — Assessment & Plan Note (Signed)
Encouraged heart healthy diet, increase exercise, avoid trans fats, consider a krill oil cap daily 

## 2020-01-10 NOTE — Assessment & Plan Note (Signed)
Quit melatonin due to side effects. Lorazepam 0.5 mg qhs has been helpful

## 2020-01-10 NOTE — Assessment & Plan Note (Signed)
She notes she feels the Venlafaxine is working well and her Anxiety has resolved since stopping the Flexeril

## 2020-01-13 ENCOUNTER — Telehealth: Payer: Self-pay | Admitting: Internal Medicine

## 2020-01-13 NOTE — Telephone Encounter (Signed)
[name] Order: 30mg  #1 prefilled syringe Ordered date: 01/13/20 Expected date of arrival: 24-48 hrs Ordered by: [place]: [employer]&Me

## 2020-01-14 ENCOUNTER — Other Ambulatory Visit (INDEPENDENT_AMBULATORY_CARE_PROVIDER_SITE_OTHER): Payer: PPO

## 2020-01-14 ENCOUNTER — Other Ambulatory Visit: Payer: Self-pay

## 2020-01-14 DIAGNOSIS — G720 Drug-induced myopathy: Secondary | ICD-10-CM | POA: Diagnosis not present

## 2020-01-14 DIAGNOSIS — R739 Hyperglycemia, unspecified: Secondary | ICD-10-CM | POA: Diagnosis not present

## 2020-01-14 DIAGNOSIS — E782 Mixed hyperlipidemia: Secondary | ICD-10-CM | POA: Diagnosis not present

## 2020-01-14 DIAGNOSIS — T466X5A Adverse effect of antihyperlipidemic and antiarteriosclerotic drugs, initial encounter: Secondary | ICD-10-CM | POA: Diagnosis not present

## 2020-01-14 LAB — CBC
HCT: 37.9 % (ref 36.0–46.0)
Hemoglobin: 12.4 g/dL (ref 12.0–15.0)
MCHC: 32.9 g/dL (ref 30.0–36.0)
MCV: 89.2 fl (ref 78.0–100.0)
Platelets: 301 10*3/uL (ref 150.0–400.0)
RBC: 4.25 Mil/uL (ref 3.87–5.11)
RDW: 14.3 % (ref 11.5–15.5)
WBC: 4.8 10*3/uL (ref 4.0–10.5)

## 2020-01-14 LAB — HEMOGLOBIN A1C: Hgb A1c MFr Bld: 5.5 % (ref 4.6–6.5)

## 2020-01-14 LAB — COMPREHENSIVE METABOLIC PANEL
ALT: 17 U/L (ref 0–35)
AST: 18 U/L (ref 0–37)
Albumin: 4 g/dL (ref 3.5–5.2)
Alkaline Phosphatase: 90 U/L (ref 39–117)
BUN: 12 mg/dL (ref 6–23)
CO2: 29 mEq/L (ref 19–32)
Calcium: 8.9 mg/dL (ref 8.4–10.5)
Chloride: 102 mEq/L (ref 96–112)
Creatinine, Ser: 0.6 mg/dL (ref 0.40–1.20)
GFR: 101.07 mL/min (ref >60.00)
Glucose, Bld: 89 mg/dL (ref 70–99)
Potassium: 4.2 mEq/L (ref 3.5–5.1)
Sodium: 136 mEq/L (ref 135–145)
Total Bilirubin: 0.4 mg/dL (ref 0.2–1.2)
Total Protein: 6.2 g/dL (ref 6.0–8.3)

## 2020-01-14 LAB — LIPID PANEL
Cholesterol: 162 mg/dL (ref 0–200)
HDL: 54.4 mg/dL (ref >39.00)
LDL Cholesterol: 82 mg/dL (ref 0–99)
NonHDL: 107.77
Total CHOL/HDL Ratio: 3
Triglycerides: 130 mg/dL (ref 0.0–149.0)
VLDL: 26 mg/dL (ref 0.0–40.0)

## 2020-01-14 LAB — TSH: TSH: 2.16 u[IU]/mL (ref 0.35–4.50)

## 2020-01-15 NOTE — Telephone Encounter (Signed)
Fasenra Shipment Received:  30mg  #1 prefilled syringe Medication arrival date: 01/15/2020 Lot #: [id number] Exp date: 9/20022 Received by: Desmond Dike, [place]

## 2020-01-23 ENCOUNTER — Other Ambulatory Visit: Payer: Self-pay

## 2020-01-23 ENCOUNTER — Ambulatory Visit (INDEPENDENT_AMBULATORY_CARE_PROVIDER_SITE_OTHER): Payer: PPO

## 2020-01-23 DIAGNOSIS — J4541 Moderate persistent asthma with (acute) exacerbation: Secondary | ICD-10-CM | POA: Diagnosis not present

## 2020-01-23 MED ORDER — BENRALIZUMAB 30 MG/ML ~~LOC~~ SOSY
30.0000 mg | PREFILLED_SYRINGE | Freq: Once | SUBCUTANEOUS | Status: AC
  Administered 2020-01-23: 30 mg via SUBCUTANEOUS

## 2020-01-23 NOTE — Progress Notes (Signed)
All questions were answered by the patient before medication was administered. Have you been hospitalized in the last 10 days? No Do you have a fever? No Do you have a cough? No Do you have a headache or sore throat? No  

## 2020-01-28 DIAGNOSIS — D2261 Melanocytic nevi of right upper limb, including shoulder: Secondary | ICD-10-CM | POA: Diagnosis not present

## 2020-01-28 DIAGNOSIS — D485 Neoplasm of uncertain behavior of skin: Secondary | ICD-10-CM | POA: Diagnosis not present

## 2020-01-28 DIAGNOSIS — L570 Actinic keratosis: Secondary | ICD-10-CM | POA: Diagnosis not present

## 2020-01-30 DIAGNOSIS — M9901 Segmental and somatic dysfunction of cervical region: Secondary | ICD-10-CM | POA: Diagnosis not present

## 2020-01-30 DIAGNOSIS — M9902 Segmental and somatic dysfunction of thoracic region: Secondary | ICD-10-CM | POA: Diagnosis not present

## 2020-01-30 DIAGNOSIS — M9905 Segmental and somatic dysfunction of pelvic region: Secondary | ICD-10-CM | POA: Diagnosis not present

## 2020-01-30 DIAGNOSIS — M9903 Segmental and somatic dysfunction of lumbar region: Secondary | ICD-10-CM | POA: Diagnosis not present

## 2020-01-31 DIAGNOSIS — Z124 Encounter for screening for malignant neoplasm of cervix: Secondary | ICD-10-CM | POA: Diagnosis not present

## 2020-01-31 DIAGNOSIS — Z6828 Body mass index (BMI) 28.0-28.9, adult: Secondary | ICD-10-CM | POA: Diagnosis not present

## 2020-01-31 DIAGNOSIS — N952 Postmenopausal atrophic vaginitis: Secondary | ICD-10-CM | POA: Diagnosis not present

## 2020-01-31 DIAGNOSIS — Z1231 Encounter for screening mammogram for malignant neoplasm of breast: Secondary | ICD-10-CM | POA: Diagnosis not present

## 2020-02-03 LAB — HM MAMMOGRAPHY: HM Mammogram: NORMAL (ref 0–4)

## 2020-02-04 LAB — HM PAP SMEAR: HM Pap smear: NEGATIVE

## 2020-02-18 ENCOUNTER — Other Ambulatory Visit: Payer: Self-pay | Admitting: Family Medicine

## 2020-02-18 ENCOUNTER — Encounter: Payer: Self-pay | Admitting: Family Medicine

## 2020-02-18 MED ORDER — VENLAFAXINE HCL ER 75 MG PO CP24: 75.0000 mg | ORAL_CAPSULE | Freq: Every day | ORAL | 1 refills | Status: DC

## 2020-02-18 MED ORDER — VENLAFAXINE HCL ER 37.5 MG PO CP24
37.5000 mg | ORAL_CAPSULE | Freq: Every day | ORAL | 1 refills | Status: DC
Start: 2020-02-18 — End: 2020-07-13

## 2020-02-18 NOTE — Progress Notes (Unsigned)
venlafa

## 2020-03-11 ENCOUNTER — Telehealth: Payer: Self-pay | Admitting: Internal Medicine

## 2020-03-11 NOTE — Telephone Encounter (Signed)
Spoke with patient. Patient scheduled 03/31/20, at 1045.

## 2020-03-12 DIAGNOSIS — Z20828 Contact with and (suspected) exposure to other viral communicable diseases: Secondary | ICD-10-CM | POA: Diagnosis not present

## 2020-03-14 ENCOUNTER — Telehealth: Payer: Self-pay | Admitting: Pulmonary Disease

## 2020-03-14 MED ORDER — PREDNISONE 10 MG PO TABS: ORAL_TABLET | ORAL | 0 refills | Status: AC

## 2020-03-14 NOTE — Telephone Encounter (Signed)
Followed by Dr. Chase Caller for severe persistent asthma with eosinophilic pattern.  Has been on fasenra.  She developed sinus congestion about 4 days.  This progressed to chest tightness and dry cough.  She reports having COVID testing done on 03/12/20 and that was negative.    Denies fever, sputum, hemoptysis, GI symptoms, skin rash.  Had a headache earlier, but improved.  Will send script for tapering dose of prednisone.  Don't think she needs antibiotics at this time.  She can continue prn albuterol.  She has trip to the beach planned and will be leaving on Monday, 03/16/20.  Advised her to call office on Monday before her trip if her symptoms haven't improved.

## 2020-03-23 ENCOUNTER — Telehealth: Payer: Self-pay | Admitting: Internal Medicine

## 2020-03-23 MED ORDER — FASENRA 30 MG/ML ~~LOC~~ SOSY: 1.0000 mL | PREFILLED_SYRINGE | SUBCUTANEOUS | 5 refills | Status: DC

## 2020-03-23 NOTE — Telephone Encounter (Signed)
[name] Order: 30mg  #1 prefilled syringe Ordered date: 03/23/20 Expected date of arrival: 10/7-10/11/21 Ordered by: [place]: AZ&Me

## 2020-03-25 NOTE — Telephone Encounter (Signed)
Fasenra Shipment Received:  30mg  #1 prefilled syringe Medication arrival date: 03/25/20 Lot #: [id number] Exp date: 05/19/2021 Received by: Elliot Dally

## 2020-03-26 ENCOUNTER — Ambulatory Visit: Payer: PPO

## 2020-03-31 ENCOUNTER — Other Ambulatory Visit: Payer: Self-pay

## 2020-03-31 ENCOUNTER — Ambulatory Visit (INDEPENDENT_AMBULATORY_CARE_PROVIDER_SITE_OTHER): Payer: PPO

## 2020-03-31 DIAGNOSIS — J4541 Moderate persistent asthma with (acute) exacerbation: Secondary | ICD-10-CM

## 2020-03-31 MED ORDER — BENRALIZUMAB 30 MG/ML ~~LOC~~ SOSY
30.0000 mg | PREFILLED_SYRINGE | Freq: Once | SUBCUTANEOUS | Status: AC
  Administered 2020-03-31: 30 mg via SUBCUTANEOUS

## 2020-03-31 NOTE — Progress Notes (Signed)
Have you been hospitalized within the last 10 days?  No Do you have a fever?  No Do you have a cough?  No Do you have a headache or sore throat? No Do you have your Epi Pen visible and is it within date?  Yes 

## 2020-04-01 ENCOUNTER — Telehealth: Payer: Self-pay | Admitting: Internal Medicine

## 2020-04-01 ENCOUNTER — Other Ambulatory Visit: Payer: PPO

## 2020-04-01 DIAGNOSIS — B4481 Allergic bronchopulmonary aspergillosis: Secondary | ICD-10-CM | POA: Diagnosis not present

## 2020-04-01 DIAGNOSIS — R7989 Other specified abnormal findings of blood chemistry: Secondary | ICD-10-CM

## 2020-04-01 LAB — D-DIMER, QUANTITATIVE: D-Dimer, Quant: 1.08 mcg/mL FEU — ABNORMAL HIGH (ref <0.50)

## 2020-04-01 NOTE — Telephone Encounter (Signed)
   She has hiatal hernia.  She has ABPA/asthma.  Last CT scan of the chest was in 2017.  Plan  - get d-dimer blood work -> if abnormal : will do CTA rule out PE     - if norml: will do HRCT  She can also get PCP Mosie Lukes, MD to start of the blood work -  Allergies  Allergen Reactions  . Beclomethasone Dipropionate Hives and Other (See Comments)     weight gain  . Flexeril [Cyclobenzaprine] Anxiety  . Mometasone Furo-Formoterol Fum Hives and Other (See Comments)    weight gain  . Sulfonamide Derivatives Hives and Rash  . Statins     Myalgias, RLS      Current Outpatient Medications:  .  acyclovir ointment (ZOVIRAX) 5 %, Apply 1 application topically every 3 (three) hours., Disp: 15 g, Rfl: 2 .  alendronate (FOSAMAX) 70 MG tablet, TAKE 1 TABLET BY MOUTH ONCE WEEKLY, Disp: 4 tablet, Rfl: 11 .  ASHWAGANDHA PO, Take by mouth., Disp: , Rfl:  .  aspirin 81 MG tablet, Take 81 mg by mouth daily. , Disp: , Rfl:  .  Benralizumab (FASENRA) 30 MG/ML SOSY, Inject 1 mL (30 mg total) into the skin every 8 (eight) weeks., Disp: 1 mL, Rfl: 5 .  Bioflavonoid Products (ESTER C PO), Take 500 mg by mouth daily., Disp: , Rfl:  .  Calcium-Magnesium-Vitamin D 300-150-400 MG-MG-UNIT TABS, Take 1 tablet by mouth daily., Disp: , Rfl:  .  famotidine (PEPCID) 20 MG tablet, Take 1 tablet (20 mg total) by mouth at bedtime., Disp: 30 tablet, Rfl: 6 .  LORazepam (ATIVAN) 1 MG tablet, Take 0.5-1 tablets (0.5-1 mg total) by mouth 2 (two) times daily as needed for anxiety or sleep., Disp: 30 tablet, Rfl: 2 .  MAGNESIUM GLYCINATE PO, Take 400 mg by mouth daily., Disp: , Rfl:  .  mupirocin ointment (BACTROBAN) 2 %, Place 1 application into the nose 2 (two) times daily., Disp: 22 g, Rfl: 1 .  nitroGLYCERIN (NITROSTAT) 0.4 MG SL tablet, PLACE 1 TABLET UNDER THE TONGUE EVERY 5 MINUTES AS NEEDED FOR CHEST PAIN., Disp: 25 tablet, Rfl: 4 .  pantoprazole (PROTONIX) 40 MG tablet, Take 1 tablet (40 mg total) by mouth  daily., Disp: 90 tablet, Rfl: 3 .  REPATHA SURECLICK 035 MG/ML SOAJ, INJECT 1 PEN INTO THE SKIN EVERY 14 DAYS, Disp: 6 mL, Rfl: 3 .  venlafaxine XR (EFFEXOR XR) 37.5 MG 24 hr capsule, Take 1 capsule (37.5 mg total) by mouth daily with breakfast., Disp: 30 capsule, Rfl: 1 .  venlafaxine XR (EFFEXOR XR) 75 MG 24 hr capsule, Take 1 capsule (75 mg total) by mouth daily with breakfast., Disp: 30 capsule, Rfl: 1 .  VITAMIN D PO, Take 5,000 Units by mouth daily., Disp: , Rfl:

## 2020-04-01 NOTE — Telephone Encounter (Signed)
D-dimer slightly high  Plan  - get CTAngio chest rule out PE. Can be done 04/02/20  but if gettingworse go to ER 04/01/2020  - unfortunately might need a new BMET unless radiology accepts old BMET

## 2020-04-01 NOTE — Telephone Encounter (Signed)
Spoke with the pt  She states has been having some pain under her left breast that radiates to her shoulder blades  This has been going on a while and then last wk after she went on a walk she started having soreness on the right side in the same place  She is not having any increased cough, SOB, wheezing  States she just feels soreness  She is scheduled for a virtual visit with her PCP but wanted to let MR know what was going on

## 2020-04-01 NOTE — Telephone Encounter (Signed)
Spoke with the pt and notified of response per MR  She verbalized understanding  Order for D Dimer was placed

## 2020-04-01 NOTE — Telephone Encounter (Signed)
Spoke with the pt and made aware of results/recs  Lab order placed as well as CTA

## 2020-04-01 NOTE — Telephone Encounter (Signed)
Spoke to patient, who stated that she reviewed her lab results via [facility].  She is concerned about d dimer.   MR, please advise. thanks

## 2020-04-02 ENCOUNTER — Other Ambulatory Visit: Payer: Self-pay

## 2020-04-02 ENCOUNTER — Ambulatory Visit (INDEPENDENT_AMBULATORY_CARE_PROVIDER_SITE_OTHER)
Admission: RE | Admit: 2020-04-02 | Discharge: 2020-04-02 | Disposition: A | Discharge status: Home / Self Care | Payer: PPO | Source: Ambulatory Visit | Attending: Internal Medicine | Admitting: Internal Medicine

## 2020-04-02 ENCOUNTER — Other Ambulatory Visit (INDEPENDENT_AMBULATORY_CARE_PROVIDER_SITE_OTHER): Payer: PPO

## 2020-04-02 DIAGNOSIS — R0602 Shortness of breath: Secondary | ICD-10-CM | POA: Diagnosis not present

## 2020-04-02 DIAGNOSIS — R7989 Other specified abnormal findings of blood chemistry: Secondary | ICD-10-CM

## 2020-04-02 DIAGNOSIS — R079 Chest pain, unspecified: Secondary | ICD-10-CM | POA: Diagnosis not present

## 2020-04-02 LAB — BASIC METABOLIC PANEL
BUN: 14 mg/dL (ref 6–23)
CO2: 28 mEq/L (ref 19–32)
Calcium: 8.9 mg/dL (ref 8.4–10.5)
Chloride: 102 mEq/L (ref 96–112)
Creatinine, Ser: 0.65 mg/dL (ref 0.40–1.20)
GFR: 94.55 mL/min (ref >60.00)
Glucose, Bld: 91 mg/dL (ref 70–99)
Potassium: 3.9 mEq/L (ref 3.5–5.1)
Sodium: 137 mEq/L (ref 135–145)

## 2020-04-02 MED ORDER — IOHEXOL 350 MG/ML SOLN
80.0000 mL | Freq: Once | INTRAVENOUS | Status: AC | PRN
  Administered 2020-04-02: 80 mL via INTRAVENOUS

## 2020-04-02 NOTE — Telephone Encounter (Signed)
MR- please advise on pt email, thanks!  [name] sent to [facility] Pool Just wondering, since this is being scheduled for my chest.........Marland KitchenIs it possible this possible for this CT Scan to also include cervical spine area as a follow-up to the MRI of 06/08/19 MRI of cervical spine since recent symptoms of also pain left temporal region, and left shoulder area pain.   Thank you. I'm headed soon to your office for additional labs you ordered prior to CT Scan being done.

## 2020-04-02 NOTE — Telephone Encounter (Signed)
She is already had a CT scan of the chest.  Therefore  -Cannot do other imaging.  Plus it is outside my area of expertise to look at the neck  = On the CT scan of the chest no evidence of pulmonary embolism.  She just has chronic scarring and right lower lobe nodule and large hiatal hernia.  It is possible that some of the shoulder discomfort is a reflection of the hiatal hernia because they are reporting hiatal hernia is large.  She should see a gastroenterologist for this.   CT Angio Chest W/Cm &/Or Wo Cm  Result Date: 04/02/2020 CLINICAL DATA:  Chest pain and shortness of breath for 1 week EXAM: CT ANGIOGRAPHY CHEST WITH CONTRAST TECHNIQUE: Multidetector CT imaging of the chest was performed using the standard protocol during bolus administration of intravenous contrast. Multiplanar CT image reconstructions and MIPs were obtained to evaluate the vascular anatomy. CONTRAST:  86mL OMNIPAQUE IOHEXOL 350 MG/ML SOLN COMPARISON:  06/07/2016 FINDINGS: Cardiovascular: Thoracic aorta and its branches demonstrate atherosclerotic calcification. No aneurysmal dilatation is seen. No evidence of dissection is noted. No cardiac enlargement is noted. Coronary calcifications are noted. The pulmonary artery shows a normal branching pattern. No filling defect to suggest pulmonary embolism is noted. Mediastinum/Nodes: Thoracic inlet is within normal limits. No sizable hilar or mediastinal adenopathy is noted. The esophagus is within normal limits. There is a moderate-sized sliding-type hiatal hernia identified however. Focal defect is noted in the posterior aspect of the left hemidiaphragm with herniation of fat into the chest cavity. This is stable from the prior exam. Lungs/Pleura: Biapical pleural and parenchymal scarring is noted stable from the prior exam. No focal infiltrate is seen. Stable right lower lobe nodule is noted laterally best seen on image number 63 of series 6. Given its long-term stability it is felt to  be benign in etiology. No other sizable parenchymal nodule is noted. Upper Abdomen: Previously mentioned hiatal hernia. The remainder of the upper abdomen appears within normal limits. Musculoskeletal: Degenerative changes of the thoracic spine are noted. Review of the MIP images confirms the above findings. IMPRESSION: No evidence of pulmonary emboli. Chronic scarring. Right lower lobe nodule stable from the prior exam consistent with a benign etiology. No further follow-up is recommended. Large hiatal hernia. Aortic Atherosclerosis (ICD10-I70.0). Electronically Signed   By: Inez Catalina M.D.   On: 04/02/2020 16:43

## 2020-04-03 ENCOUNTER — Telehealth (INDEPENDENT_AMBULATORY_CARE_PROVIDER_SITE_OTHER): Payer: PPO | Admitting: Family Medicine

## 2020-04-03 DIAGNOSIS — G4733 Obstructive sleep apnea (adult) (pediatric): Secondary | ICD-10-CM | POA: Diagnosis not present

## 2020-04-03 DIAGNOSIS — M546 Pain in thoracic spine: Secondary | ICD-10-CM

## 2020-04-03 DIAGNOSIS — R739 Hyperglycemia, unspecified: Secondary | ICD-10-CM | POA: Diagnosis not present

## 2020-04-03 DIAGNOSIS — M542 Cervicalgia: Secondary | ICD-10-CM

## 2020-04-03 DIAGNOSIS — R0789 Other chest pain: Secondary | ICD-10-CM | POA: Diagnosis not present

## 2020-04-03 MED ORDER — TIZANIDINE HCL 2 MG PO TABS: 1.0000 mg | ORAL_TABLET | Freq: Two times a day (BID) | ORAL | 1 refills | Status: DC | PRN

## 2020-04-03 NOTE — Assessment & Plan Note (Signed)
hgba1c acceptable, minimize simple carbs. Increase exercise as tolerated.  

## 2020-04-03 NOTE — Progress Notes (Signed)
.Virtual Visit via Video Note  I connected with Victoria [name] on 04/03/20 at  9:40 AM EDT by a video enabled telemedicine application and verified that I am speaking with the correct person using two identifiers.  Location: Patient: home, patient and provider in the visit Provider: home   I discussed the limitations of evaluation and management by telemedicine and the availability of in person appointments. The patient expressed understanding and agreed to proceed. Corinna Capra, CMA was able to get the patient set up on a video visit    Subjective:    Patient ID: Victoria [name], female    DOB: April 10, 1956, 64 y.o.   MRN: [id number]  No chief complaint on file.   HPI Patient is in today for evaluation of atypical chest pain and back pain. She recently underwent a CTA of the chest to rule out a PE and it was negative. After discussing the progression of her symptoms over the last month the pain sound more musculoskeletal than anything else. 3-4 weeks she suffered some violent coughing which left her with left temporal and neck pain. Over the next few weeks her pain migrated to her left scapula then under her left breast and around her chest wall. Is considerably better today. She has previoously worked with physical therapy regularly but had stopped it. No GI or GU concerns noted.   Past Medical History:  Diagnosis Date  . 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 Anemia    iron deficiency, resolved  . Anxiety   . Asthma   . CAD (coronary artery disease)    a. LHC 6/16:  oOM1 60, pRCA 25 >> med Rx b. cath 3/19 2nd OM with 95% stenosis s/p synergy DES & anomalous RCA  . CAP (community acquired pneumonia) 2016; 06/07/2016  . Chronic bronchitis ([place])   . Chronic lower back pain   . Complication of anesthesia    "think I have a hard time waking up from it"  . COPD (chronic obstructive pulmonary disease) ([place])   . Depression    mild   . Diverticulitis   . Diverticulosis   . GERD (gastroesophageal reflux disease)   . H/O hiatal hernia   . Headache    "weekly" (08/23/2017)  . History of echocardiogram    Echo 6/16:  Mod LVH, EF 60-65%, no RWMA, Gr 1 DD, trivial MR, normal LA size.  Marland Kitchen Hyperglycemia 11/20/2015  . Hyperlipidemia, mixed 09/11/2007   Qualifier: Diagnosis of  By: Jerold Coombe   Did not tolerate Lipitor, zocor, Lovastatin, Pravastatin, Livalo, Crestor even low dose   . IBS (irritable bowel syndrome)   . Maxillary sinusitis   . Normal cardiac stress test 11/2011   No evidence of ischemia or infarct. Calculated ejection fraction 72%.  . Obesity   . OSA (obstructive sleep apnea) 02/2012   has stopped using  cpap  . Osteoarthritis   . Osteoporosis   . Pneumonia 11/2011   "before 2013 I hadn't had pneumonia since I was a child" (04/13/2012)  . Pulmonary nodules   . S/P angioplasty with stent 08/23/17 ostial 2nd OM with DES synnergy 08/24/2017  . Schatzki's ring     Past Surgical History:  Procedure Laterality Date  . APPENDECTOMY  1989  . CARDIAC CATHETERIZATION N/A 11/25/2014   Procedure: Right/Left Heart Cath and Coronary Angiography;  Surgeon: Belva Crome, MD;  Location: [facility];  Service: Cardiovascular;  Laterality: N/A;  .  CESAREAN SECTION  1985  . CORONARY ANGIOPLASTY WITH STENT PLACEMENT  08/23/2017  . CORONARY STENT INTERVENTION N/A 08/23/2017   Procedure: CORONARY STENT INTERVENTION;  Surgeon: Burnell Blanks, MD;  Location: [facility];  Service: Cardiovascular;  Laterality: N/A;  . HERNIA REPAIR  04/13/2012   VHR laparoscopic  . LEFT HEART CATH AND CORONARY ANGIOGRAPHY N/A 08/23/2017   Procedure: LEFT HEART CATH AND CORONARY ANGIOGRAPHY;  Surgeon: Burnell Blanks, MD;  Location: [facility];  Service: Cardiovascular;  Laterality: N/A;  . VENTRAL HERNIA REPAIR  04/13/2012   Procedure: LAPAROSCOPIC VENTRAL HERNIA;  Surgeon: Adin Hector, MD;  Location: [facility];  Service: General;  Laterality: N/A;  laparoscopic repair of incarcerated hernia    Family History  Problem Relation Age of Onset  . Breast cancer Mother   . Hypertension Mother   . Diabetes Mother   . Diverticulosis Father   . Prostate cancer Father   . Pulmonary embolism Brother        recurrent  . Heart attack Maternal Grandfather   . Breast cancer Sister   . Stroke Neg Hx   . Colon cancer Neg Hx   . Esophageal cancer Neg Hx   . Stomach cancer Neg Hx   . Rectal cancer Neg Hx     Social History   Socioeconomic History  . Marital status: Married    Spouse name: Not on file  . Number of children: 1  . Years of education: 31  . Highest education level: Associate degree: academic program  Occupational History  . Occupation: Disabled   Tobacco Use  . Smoking status: Never Smoker  . Smokeless tobacco: Never Used  Vaping Use  . Vaping Use: Never used  Substance and Sexual Activity  . Alcohol use: Yes    Comment: social use  . Drug use: No  . Sexual activity: Yes    Comment: gluten free, lives with husband and son with CP quadriplegia  Other Topics Concern  . 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    Lives at home with her husband and son.   Right-handed.   2 cups caffeine per day.   One story home   Social Determinants of Health   Financial Resource Strain:   . Difficulty of Paying Living Expenses: Not on file  Food Insecurity:   . Worried About Charity fundraiser in the Last Year: Not on file  . Ran Out of Food in the Last Year: Not on file  Transportation Needs:   . Lack of Transportation (Medical): Not on file  . Lack of Transportation (Non-Medical): Not on file  Physical Activity:   . Days of Exercise per Week: Not on file  . Minutes of Exercise per Session: Not on file  Stress:   . Feeling of Stress : Not on file  Social Connections:   . Frequency of Communication with Friends and Family: Not on file  .  Frequency of Social Gatherings with Friends and Family: Not on file  . Attends Religious Services: Not on file  . Active Member of Clubs or Organizations: Not on file  . Attends Archivist Meetings: Not on file  . Marital Status: Not on file  Intimate Partner Violence:   . Fear of Current or Ex-Partner: Not on file  . Emotionally Abused: Not on file  . Physically Abused: Not on file  . Sexually Abused: Not on  file    Outpatient Medications Prior to Visit  Medication Sig Dispense Refill  . acyclovir ointment (ZOVIRAX) 5 % Apply 1 application topically every 3 (three) hours. 15 g 2  . alendronate (FOSAMAX) 70 MG tablet TAKE 1 TABLET BY MOUTH ONCE WEEKLY 4 tablet 11  . ASHWAGANDHA PO Take by mouth.    Marland Kitchen aspirin 81 MG tablet Take 81 mg by mouth daily.     . Benralizumab (FASENRA) 30 MG/ML SOSY Inject 1 mL (30 mg total) into the skin every 8 (eight) weeks. 1 mL 5  . Bioflavonoid Products (ESTER C PO) Take 500 mg by mouth daily.    . Calcium-Magnesium-Vitamin D 300-150-400 MG-MG-UNIT TABS Take 1 tablet by mouth daily.    . famotidine (PEPCID) 20 MG tablet Take 1 tablet (20 mg total) by mouth at bedtime. 30 tablet 6  . LORazepam (ATIVAN) 1 MG tablet Take 0.5-1 tablets (0.5-1 mg total) by mouth 2 (two) times daily as needed for anxiety or sleep. 30 tablet 2  . MAGNESIUM GLYCINATE PO Take 400 mg by mouth daily.    . mupirocin ointment (BACTROBAN) 2 % Place 1 application into the nose 2 (two) times daily. 22 g 1  . nitroGLYCERIN (NITROSTAT) 0.4 MG SL tablet PLACE 1 TABLET UNDER THE TONGUE EVERY 5 MINUTES AS NEEDED FOR CHEST PAIN. 25 tablet 4  . pantoprazole (PROTONIX) 40 MG tablet Take 1 tablet (40 mg total) by mouth daily. 90 tablet 3  . REPATHA SURECLICK 789 MG/ML SOAJ INJECT 1 PEN INTO THE SKIN EVERY 14 DAYS 6 mL 3  . venlafaxine XR (EFFEXOR XR) 37.5 MG 24 hr capsule Take 1 capsule (37.5 mg total) by mouth daily with breakfast. 30 capsule 1  . venlafaxine XR (EFFEXOR XR) 75 MG 24 hr  capsule Take 1 capsule (75 mg total) by mouth daily with breakfast. 30 capsule 1  . VITAMIN D PO Take 5,000 Units by mouth daily.     No facility-administered medications prior to visit.    Allergies  Allergen Reactions  . Beclomethasone Dipropionate Hives and Other (See Comments)     weight gain  . Flexeril [Cyclobenzaprine] Anxiety  . Mometasone Furo-Formoterol Fum Hives and Other (See Comments)    weight gain  . Sulfonamide Derivatives Hives and Rash  . Statins     Myalgias, RLS    Review of Systems  Constitutional: Positive for malaise/fatigue. Negative for fever.  HENT: Negative for congestion.   Eyes: Negative for blurred vision.  Respiratory: Negative for shortness of breath.   Cardiovascular: Positive for chest pain. Negative for palpitations and leg swelling.  Gastrointestinal: Negative for abdominal pain, blood in stool and nausea.  Genitourinary: Negative for dysuria and frequency.  Musculoskeletal: Positive for back pain, myalgias and neck pain. Negative for falls.  Skin: Negative for rash.  Neurological: Negative for dizziness, loss of consciousness and headaches.  Endo/Heme/Allergies: Negative for environmental allergies.  Psychiatric/Behavioral: Negative for depression. The patient is not nervous/anxious.        Objective:    Physical Exam Constitutional:      Appearance: Normal appearance. She is not ill-appearing.  HENT:     Head: Normocephalic and atraumatic.     Right Ear: External ear normal.     Left Ear: External ear normal.     Nose: Nose normal.  Eyes:     General:        Right eye: No discharge.        Left eye: No discharge.  Pulmonary:  Effort: Pulmonary effort is normal.  Neurological:     Mental Status: She is alert and oriented to person, place, and time.  Psychiatric:        Behavior: Behavior normal.     There were no vitals taken for this visit. Wt Readings from Last 3 Encounters:  01/10/20 148 lb (67.1 kg)  12/11/19 153  lb (69.4 kg)  11/19/19 157 lb (71.2 kg)    Diabetic Foot Exam - Simple   No data filed     Lab Results  Component Value Date   WBC 4.8 01/14/2020   HGB 12.4 01/14/2020   HCT 37.9 01/14/2020   PLT 301.0 01/14/2020   GLUCOSE 91 04/02/2020   CHOL 162 01/14/2020   TRIG 130.0 01/14/2020   HDL 54.40 01/14/2020   LDLDIRECT 177.8 09/07/2011   LDLCALC 82 01/14/2020   ALT 17 01/14/2020   AST 18 01/14/2020   NA 137 04/02/2020   K 3.9 04/02/2020   CL 102 04/02/2020   CREATININE 0.65 04/02/2020   BUN 14 04/02/2020   CO2 28 04/02/2020   TSH 2.16 01/14/2020   INR 1.0 08/21/2017   HGBA1C 5.5 01/14/2020    Lab Results  Component Value Date   TSH 2.16 01/14/2020   Lab Results  Component Value Date   WBC 4.8 01/14/2020   HGB 12.4 01/14/2020   HCT 37.9 01/14/2020   MCV 89.2 01/14/2020   PLT 301.0 01/14/2020   Lab Results  Component Value Date   NA 137 04/02/2020   K 3.9 04/02/2020   CO2 28 04/02/2020   GLUCOSE 91 04/02/2020   BUN 14 04/02/2020   CREATININE 0.65 04/02/2020   BILITOT 0.4 01/14/2020   ALKPHOS 90 01/14/2020   AST 18 01/14/2020   ALT 17 01/14/2020   PROT 6.2 01/14/2020   ALBUMIN 4.0 01/14/2020   CALCIUM 8.9 04/02/2020   ANIONGAP 6 08/24/2017   GFR 94.55 04/02/2020   Lab Results  Component Value Date   CHOL 162 01/14/2020   Lab Results  Component Value Date   HDL 54.40 01/14/2020   Lab Results  Component Value Date   LDLCALC 82 01/14/2020   Lab Results  Component Value Date   TRIG 130.0 01/14/2020   Lab Results  Component Value Date   CHOLHDL 3 01/14/2020   Lab Results  Component Value Date   HGBA1C 5.5 01/14/2020       Assessment & Plan:   Problem List Items Addressed This Visit    OSA (obstructive sleep apnea) - Primary (Chronic)   Hyperglycemia (Chronic)    hgba1c acceptable, minimize simple carbs. Increase exercise as tolerated.       Chest pain, atypical    She recently underwent a CTA of the chest to rule out a PE and it  was negative. After discussing the progression of her symptoms over the last month the pain sound more musculoskeletal than anything else. 3-4 weeks she suffered some violent coughing which left her with left temporal and neck pain. Over the next few weeks her pain migrated to her left scapula then under her left breast and around her chest wall. Is considerably better today. She has previoously worked with physical therapy regularly but had stopped it. A new referral is sent in and she is also encouraged to add an exercise regimen such as Yoga or Taichi to help keep her self stronger. She is also given some Tizanidine to use 1-4 mg up to bid as needed and as tolerated. Encouraged  moist heat and gentle stretching as tolerated. May try NSAIDs and prescription meds as directed and report if symptoms worsen or seek immediate care      Neck pain   Relevant Orders   Ambulatory referral to Physical Therapy    Other Visit Diagnoses    Atypical chest pain       Relevant Orders   Ambulatory referral to Physical Therapy   Thoracic back pain, unspecified back pain laterality, unspecified chronicity       Relevant Medications   tiZANidine (ZANAFLEX) 2 MG tablet   Other Relevant Orders   Ambulatory referral to Physical Therapy      I am having Victoria [name] start on tiZANidine. I am also having her maintain her aspirin, Calcium-Magnesium-Vitamin D, acyclovir ointment, Bioflavonoid Products (ESTER C PO), nitroGLYCERIN, mupirocin ointment, VITAMIN D PO, MAGNESIUM GLYCINATE PO, ASHWAGANDHA PO, LORazepam, Repatha SureClick, pantoprazole, famotidine, venlafaxine XR, venlafaxine XR, alendronate, and Fasenra.  Meds ordered this encounter  Medications  . tiZANidine (ZANAFLEX) 2 MG tablet    Sig: Take 0.5-2 tablets (1-4 mg total) by mouth 2 (two) times daily as needed for muscle spasms.    Dispense:  30 tablet    Refill:  1   I discussed the assessment and treatment plan with the patient. The patient was  provided an opportunity to ask questions and all were answered. The patient agreed with the plan and demonstrated an understanding of the instructions.   The patient was advised to call back or seek an in-person evaluation if the symptoms worsen or if the condition fails to improve as anticipated.  I provided 15 minutes of non-face-to-face time during this encounter.   [name], MD

## 2020-04-03 NOTE — Assessment & Plan Note (Signed)
She recently underwent a CTA of the chest to rule out a PE and it was negative. After discussing the progression of her symptoms over the last month the pain sound more musculoskeletal than anything else. 3-4 weeks she suffered some violent coughing which left her with left temporal and neck pain. Over the next few weeks her pain migrated to her left scapula then under her left breast and around her chest wall. Is considerably better today. She has previoously worked with physical therapy regularly but had stopped it. A new referral is sent in and she is also encouraged to add an exercise regimen such as Yoga or Taichi to help keep her self stronger. She is also given some Tizanidine to use 1-4 mg up to bid as needed and as tolerated. Encouraged moist heat and gentle stretching as tolerated. May try NSAIDs and prescription meds as directed and report if symptoms worsen or seek immediate care

## 2020-04-06 ENCOUNTER — Other Ambulatory Visit: Payer: Self-pay

## 2020-04-06 ENCOUNTER — Telehealth: Payer: Self-pay | Admitting: Cardiology

## 2020-04-06 MED ORDER — TIZANIDINE HCL 2 MG PO TABS
1.0000 mg | ORAL_TABLET | Freq: Two times a day (BID) | ORAL | 1 refills | Status: DC | PRN
Start: 2020-04-06 — End: 2020-11-11

## 2020-04-06 NOTE — Progress Notes (Signed)
Cardiology Office Note    Date:  04/07/2020   ID:  Nyiah, Pianka 1955/07/27, MRN [id number]  PCP:  Mosie Lukes, MD  Cardiologist: Ena Dawley, MD EPS: None  Chief Complaint  Patient presents with  . Follow-up    History of Present Illness:  Victoria [name] is a 64 y.o. female with history of CAD status post DES to the OM 08/2017, low risk Myoview 02/2019, hyperlipidemia on PCSK9 inhibitor, anxiety today on CPAP,  Patient has long history of chest pain. Just saw PCP 04/03/2020 with atypical chest pain felt to be musculoskeletal with violent coughing neck pain and was given tizanidine and NSAIDs but hasn't gotten yet, recent chest CT showed chronic scarring in right lower lobe nodule and large hiatal hernia which was felt to be causing some of her symptoms. She has moderate persistent asthma and has been treated for this as well with steroid taper.  Patient says she thinks her problems are coming from her asthma. Soreness under left shoulder blade around to under left breast into sternum and under right breast. All occurred after her violent coughing. Also having to take extra pepcid. Hasn't used NTG. Walks and does housework without a problem. Gets tightness in her chest when her asthma is acting up. Walked 2 weeks ago and was sore under her ribs. Also had to use her nebulizer twice recently.Bras feeling very tight.   Past Medical History:  Diagnosis Date  . 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 Anemia    iron deficiency, resolved  . Anxiety   . Asthma   . CAD (coronary artery disease)    a. LHC 6/16:  oOM1 60, pRCA 25 >> med Rx b. cath 3/19 2nd OM with 95% stenosis s/p synergy DES & anomalous RCA  . CAP (community acquired pneumonia) 2016; 06/07/2016  . Chronic bronchitis ([place])   . Chronic lower back pain   . Complication of anesthesia    "think I have a hard time waking up from it"  . COPD (chronic  obstructive pulmonary disease) ([place])   . Depression    mild  . Diverticulitis   . Diverticulosis   . GERD (gastroesophageal reflux disease)   . H/O hiatal hernia   . Headache    "weekly" (08/23/2017)  . History of echocardiogram    Echo 6/16:  Mod LVH, EF 60-65%, no RWMA, Gr 1 DD, trivial MR, normal LA size.  Marland Kitchen Hyperglycemia 11/20/2015  . Hyperlipidemia, mixed 09/11/2007   Qualifier: Diagnosis of  By: Jerold Coombe   Did not tolerate Lipitor, zocor, Lovastatin, Pravastatin, Livalo, Crestor even low dose   . IBS (irritable bowel syndrome)   . Maxillary sinusitis   . Normal cardiac stress test 11/2011   No evidence of ischemia or infarct. Calculated ejection fraction 72%.  . Obesity   . OSA (obstructive sleep apnea) 02/2012   has stopped using  cpap  . Osteoarthritis   . Osteoporosis   . Pneumonia 11/2011   "before 2013 I hadn't had pneumonia since I was a child" (04/13/2012)  . Pulmonary nodules   . S/P angioplasty with stent 08/23/17 ostial 2nd OM with DES synnergy 08/24/2017  . Schatzki's ring     Past Surgical History:  Procedure Laterality Date  . APPENDECTOMY  1989  . CARDIAC CATHETERIZATION N/A 11/25/2014   Procedure: Right/Left Heart Cath and Coronary Angiography;  Surgeon: Belva Crome, MD;  Location: [facility];  Service: Cardiovascular;  Laterality: N/A;  . CESAREAN SECTION  1985  . CORONARY ANGIOPLASTY WITH STENT PLACEMENT  08/23/2017  . CORONARY STENT INTERVENTION N/A 08/23/2017   Procedure: CORONARY STENT INTERVENTION;  Surgeon: Burnell Blanks, MD;  Location: [facility];  Service: Cardiovascular;  Laterality: N/A;  . HERNIA REPAIR  04/13/2012   VHR laparoscopic  . LEFT HEART CATH AND CORONARY ANGIOGRAPHY N/A 08/23/2017   Procedure: LEFT HEART CATH AND CORONARY ANGIOGRAPHY;  Surgeon: Burnell Blanks, MD;  Location: [facility];  Service: Cardiovascular;  Laterality: N/A;  . VENTRAL HERNIA REPAIR  04/13/2012   Procedure: LAPAROSCOPIC  VENTRAL HERNIA;  Surgeon: Adin Hector, MD;  Location: [facility];  Service: General;  Laterality: N/A;  laparoscopic repair of incarcerated hernia    Current Medications: Current Meds  Medication Sig  . acyclovir ointment (ZOVIRAX) 5 % Apply 1 application topically every 3 (three) hours.  Marland Kitchen alendronate (FOSAMAX) 70 MG tablet TAKE 1 TABLET BY MOUTH ONCE WEEKLY  . ASHWAGANDHA PO Take by mouth.  Marland Kitchen aspirin 81 MG tablet Take 81 mg by mouth daily.   . Benralizumab (FASENRA) 30 MG/ML SOSY Inject 1 mL (30 mg total) into the skin every 8 (eight) weeks.  Marland Kitchen Bioflavonoid Products (ESTER C PO) Take 500 mg by mouth daily.  . Calcium-Magnesium-Vitamin D 300-150-400 MG-MG-UNIT TABS Take 1 tablet by mouth daily.  . famotidine (PEPCID) 20 MG tablet Take 1 tablet (20 mg total) by mouth at bedtime.  Marland Kitchen LORazepam (ATIVAN) 1 MG tablet Take 0.5-1 tablets (0.5-1 mg total) by mouth 2 (two) times daily as needed for anxiety or sleep.  Marland Kitchen MAGNESIUM GLYCINATE PO Take 400 mg by mouth daily.  . mupirocin ointment (BACTROBAN) 2 % Place 1 application into the nose 2 (two) times daily.  . nitroGLYCERIN (NITROSTAT) 0.4 MG SL tablet PLACE 1 TABLET UNDER THE TONGUE EVERY 5 MINUTES AS NEEDED FOR CHEST PAIN.  Marland Kitchen pantoprazole (PROTONIX) 40 MG tablet Take 1 tablet (40 mg total) by mouth daily.  Marland Kitchen REPATHA SURECLICK 601 MG/ML SOAJ INJECT 1 PEN INTO THE SKIN EVERY 14 DAYS  . tiZANidine (ZANAFLEX) 2 MG tablet Take 0.5-2 tablets (1-4 mg total) by mouth 2 (two) times daily as needed for muscle spasms.  Marland Kitchen venlafaxine XR (EFFEXOR XR) 37.5 MG 24 hr capsule Take 1 capsule (37.5 mg total) by mouth daily with breakfast.  . venlafaxine XR (EFFEXOR XR) 75 MG 24 hr capsule Take 1 capsule (75 mg total) by mouth daily with breakfast.  . VITAMIN D PO Take 5,000 Units by mouth daily.     Allergies:   Beclomethasone dipropionate, Flexeril [cyclobenzaprine], Mometasone furo-formoterol fum, Sulfonamide derivatives, and Statins   Social History    Socioeconomic History  . Marital status: Married    Spouse name: Not on file  . Number of children: 1  . Years of education: 52  . Highest education level: Associate degree: academic program  Occupational History  . Occupation: Disabled   Tobacco Use  . Smoking status: Never Smoker  . Smokeless tobacco: Never Used  Vaping Use  . Vaping Use: Never used  Substance and Sexual Activity  . Alcohol use: Yes    Comment: social use  . Drug use: No  . Sexual activity: Yes    Comment: gluten free, lives with husband and son with CP quadriplegia  Other Topics Concern  . 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    Lives  at home with her husband and son.   Right-handed.   2 cups caffeine per day.   One story home   Social Determinants of Health   Financial Resource Strain:   . Difficulty of Paying Living Expenses: Not on file  Food Insecurity:   . Worried About Charity fundraiser in the Last Year: Not on file  . Ran Out of Food in the Last Year: Not on file  Transportation Needs:   . Lack of Transportation (Medical): Not on file  . Lack of Transportation (Non-Medical): Not on file  Physical Activity:   . Days of Exercise per Week: Not on file  . Minutes of Exercise per Session: Not on file  Stress:   . Feeling of Stress : Not on file  Social Connections:   . Frequency of Communication with Friends and Family: Not on file  . Frequency of Social Gatherings with Friends and Family: Not on file  . Attends Religious Services: Not on file  . Active Member of Clubs or Organizations: Not on file  . Attends Archivist Meetings: Not on file  . Marital Status: Not on file     Family History:  The patient's   family history includes Breast cancer in her mother and sister; Diabetes in her mother; Diverticulosis in her father; Heart attack in her maternal grandfather; Hypertension in her mother; Prostate cancer in her father; Pulmonary  embolism in her brother.   ROS:   Please see the history of present illness.    ROS All other systems reviewed and are negative.   PHYSICAL EXAM:   VS:  BP 112/80   Pulse 88   Ht 5\' 1"  (1.549 m)   Wt 156 lb (70.8 kg)   SpO2 99%   BMI 29.48 kg/m   Physical Exam  GEN: Well nourished, well developed, in no acute distress  Neck: no JVD, carotid bruits, or masses Cardiac:RRR; no murmurs, rubs, or gallops  Respiratory:  clear to auscultation bilaterally, normal work of breathing GI: soft, nontender, nondistended, + BS Ext: without cyanosis, clubbing, or edema, Good distal pulses bilaterally Neuro:  Alert and Oriented x 3 Psych: euthymic mood, full affect  Wt Readings from Last 3 Encounters:  04/07/20 156 lb (70.8 kg)  01/10/20 148 lb (67.1 kg)  12/11/19 153 lb (69.4 kg)      Studies/Labs Reviewed:   EKG:  EKG is  ordered today.  The ekg ordered today demonstrates NSR, normal EKG  Recent Labs: 01/14/2020: ALT 17; Hemoglobin 12.4; Platelets 301.0; TSH 2.16 04/02/2020: BUN 14; Creatinine, Ser 0.65; Potassium 3.9; Sodium 137   Lipid Panel    Component Value Date/Time   CHOL 162 01/14/2020 1040   CHOL 227 (H) 07/25/2016 0822   TRIG 130.0 01/14/2020 1040   HDL 54.40 01/14/2020 1040   HDL 51 07/25/2016 0822   CHOLHDL 3 01/14/2020 1040   VLDL 26.0 01/14/2020 1040   LDLCALC 82 01/14/2020 1040   LDLCALC 160 (H) 07/25/2016 0822   LDLDIRECT 177.8 09/07/2011 0945    Additional studies/ records that were reviewed today include:  Echocardiogram 03/20/2019 IMPRESSIONS  1. Left ventricular ejection fraction, by visual estimation, is 60 to 65%. The left ventricle has normal function. Normal left ventricular size. Mildly increased left ventricular posterior wall thickness. There is mildly increased left ventricular  hypertrophy.  2. Elevated left ventricular end-diastolic pressure.  3. Left ventricular diastolic Doppler parameters are consistent with impaired relaxation pattern of  LV diastolic filling.  4.  Global right ventricle has normal systolic function.The right ventricular size is normal. No increase in right ventricular wall thickness.  5. Left atrial size was normal.  6. Right atrial size was normal.  7. The mitral valve is normal in structure. No evidence of mitral valve regurgitation. No evidence of mitral stenosis.  8. The tricuspid valve is normal in structure. Tricuspid valve regurgitation is trivial.  9. The aortic valve is normal in structure. Aortic valve regurgitation is mild by color flow Doppler. Structurally normal aortic valve, with no evidence of sclerosis or stenosis. 10. The pulmonic valve was normal in structure. Pulmonic valve regurgitation is not visualized by color flow Doppler. 11. Aortic dilatation noted. 12. There is mild dilatation of the ascending aorta measuring 36 mm. 13. Normal pulmonary artery systolic pressure. 14. The inferior vena cava is normal in size with greater than 50% respiratory variability, suggesting right atrial pressure of 3 mmHg.   Exercise Myoview 03/15/2019 Study Highlights    Nuclear stress EF: 74%.  There was no ST segment deviation noted during stress.  No T wave inversion was noted during stress.  Blood pressure demonstrated a hypertensive response to exercise.  The study is normal.  This is a low risk study.   Low risk stress nuclear study with normal perfusion and normal left ventricular regional and global systolic function.      Carotid ultrasound 03/12/2019 Summary: Right Carotid: Velocities in the right ICA are consistent with a 1-39% stenosis.   Left Carotid: Velocities in the left ICA are consistent with a 1-39% stenosis.               Non-hemodynamically significant plaque <50% noted in the CCA.   Vertebrals:  Bilateral vertebral arteries demonstrate antegrade flow. Subclavians: Normal flow hemodynamics were seen in bilateral subclavian              arteries.    Left heart cath  08/23/2017  Conclusion    Ost 2nd Mrg lesion is 95% stenosed.  A drug-eluting stent was successfully placed using a STENT SYNERGY DES 3X16.  Post intervention, there is a 0% residual stenosis.  The left ventricular systolic function is normal.  LV end diastolic pressure is normal.  The left ventricular ejection fraction is 55-65% by visual estimate.  There is no mitral valve regurgitation.   1. Severe single vessel CAD with severe stenosis in the ostium of the large obtuse marginal branch.  2. Successful PTCA/DES x 1 ostium of the OM. 3. 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  4. The LAD has no obstructive disease.  5. Normal LV systolic function   Recommendations: Will continue DAPT with ASA and Plavix for at least one year. Consider addition of a beta blocker or nitrate.             ASSESSMENT:    1. Precordial pain   2. Coronary artery disease involving native coronary artery of native heart with unstable angina pectoris ([place])   3. Hyperlipidemia, unspecified hyperlipidemia type      PLAN:  In order of problems listed above:  Chest pain sounds musculoskeletal and hasn't filled prescription from PCP.  Also having to take extra Pepcid for her GERD and using her nebulizers twice recently.  She does not have any significant cardiac symptoms.  She is going to schedule follow-up with GI given her large hiatal hernia on CT.  Normal Myoview last year.  She will call  if she has any cardiac symptoms after a full GI work-up and treatment of her musculoskeletal pain.  CAD status post DES to the OM 08/2017, low risk Myoview 02/2019, echo normal LVEF with mild increased left ventricular posterior wall thickness and mildly increased LVH-chest pain atypical.  Hold off on further testing at this time.  Hyperlipidemia LDL 1077/27/21 on PCSK9 inhibitor    Medication Adjustments/Labs and Tests Ordered: Current medicines are  reviewed at length with the patient today.  Concerns regarding medicines are outlined above.  Medication changes, Labs and Tests ordered today are listed in the Patient Instructions below. Patient Instructions  Medication Instructions:  Your physician recommends that you continue on your current medications as directed. Please refer to the Current Medication list given to you today.  *If you need a refill on your cardiac medications before your next appointment, please call your pharmacy*   Lab Work: None If you have labs (blood work) drawn today and your tests are completely normal, you will receive your results only by: Marland Kitchen MyChart Message (if you have MyChart) OR . A paper copy in the mail If you have any lab test that is abnormal or we need to change your treatment, we will call you to review the results.   Testing/Procedures: None   Follow-Up: At [facility], you and your health needs are our priority.  As part of our continuing mission to provide you with exceptional heart care, we have created designated Provider Care Teams.  These Care Teams include your primary Cardiologist (physician) and Advanced Practice Providers (APPs -  Physician Assistants and Nurse Practitioners) who all work together to provide you with the care you need, when you need it.  We recommend signing up for the patient portal called "MyChart".  Sign up information is provided on this After Visit Summary.  MyChart is used to connect with patients for Virtual Visits (Telemedicine).  Patients are able to view lab/test results, encounter notes, upcoming appointments, etc.  Non-urgent messages can be sent to your provider as well.   To learn more about what you can do with MyChart, go to [email or web address].    Your next appointment:   09/08/2020  The format for your next appointment:   In Person  Provider:   Ena Dawley, MD      Signed, Ermalinda Barrios, PA-C  04/07/2020 2:09 PM    [address], [place], [place]  [address] Phone: [phone]; Fax: [phone]

## 2020-04-06 NOTE — Telephone Encounter (Signed)
Pt c/o of Chest Pain: STAT if CP now or developed within 24 hours  1. Are you having CP right now? Not right now, she stated it was about an hour ago   2. Are you experiencing any other symptoms (ex. SOB, nausea, vomiting, sweating)? No   3. How long have you been experiencing CP? Over a week  4. Is your CP continuous or coming and going? Coming and going  5. Have you taken Nitroglycerin?   She is experiencing more heart burn lately as well.  She stated that the heart burn has increased in the last week as well.  She stated she just a Cat scan with Pulmonary.  She stated that there was no blood clot and everything was normal.  She stated she started a week ago with pain in her left breast, under her shoulder blade and under her left arm.    Best number [phone]  ?

## 2020-04-06 NOTE — Progress Notes (Signed)
Patient already aware of results. Seeing her 05/04/20  CT Angio Chest W/Cm &/Or Wo Cm  Result Date: 04/02/2020 CLINICAL DATA:  Chest pain and shortness of breath for 1 week EXAM: CT ANGIOGRAPHY CHEST WITH CONTRAST TECHNIQUE: Multidetector CT imaging of the chest was performed using the standard protocol during bolus administration of intravenous contrast. Multiplanar CT image reconstructions and MIPs were obtained to evaluate the vascular anatomy. CONTRAST:  74mL OMNIPAQUE IOHEXOL 350 MG/ML SOLN COMPARISON:  06/07/2016 FINDINGS: Cardiovascular: Thoracic aorta and its branches demonstrate atherosclerotic calcification. No aneurysmal dilatation is seen. No evidence of dissection is noted. No cardiac enlargement is noted. Coronary calcifications are noted. The pulmonary artery shows a normal branching pattern. No filling defect to suggest pulmonary embolism is noted. Mediastinum/Nodes: Thoracic inlet is within normal limits. No sizable hilar or mediastinal adenopathy is noted. The esophagus is within normal limits. There is a moderate-sized sliding-type hiatal hernia identified however. Focal defect is noted in the posterior aspect of the left hemidiaphragm with herniation of fat into the chest cavity. This is stable from the prior exam. Lungs/Pleura: Biapical pleural and parenchymal scarring is noted stable from the prior exam. No focal infiltrate is seen. Stable right lower lobe nodule is noted laterally best seen on image number 63 of series 6. Given its long-term stability it is felt to be benign in etiology. No other sizable parenchymal nodule is noted. Upper Abdomen: Previously mentioned hiatal hernia. The remainder of the upper abdomen appears within normal limits. Musculoskeletal: Degenerative changes of the thoracic spine are noted. Review of the MIP images confirms the above findings. IMPRESSION: No evidence of pulmonary emboli. Chronic scarring. Right lower lobe nodule stable from the prior exam  consistent with a benign etiology. No further follow-up is recommended. Large hiatal hernia. Aortic Atherosclerosis (ICD10-I70.0). Electronically Signed   By: Inez Catalina M.D.   On: 04/02/2020 16:43

## 2020-04-06 NOTE — Telephone Encounter (Signed)
Pt is calling to make an appt with Dr. Meda Coffee or an APP this week, for intermittent chest discomfort for over a week now.  Pt states she is unsure if this is pulmonary, GI, or cardiac in nature, for she has issues with all 3 systems, and see's a Specialist for all 3.  Pt states for over a week now, she has been experiencing chest discomfort under her left breast, and into her left shoulder blade.  She states the pain does not radiate down her left arm, or jaw area. Pt states she is not sob with this.  Pt states she has been nauseated one time with these episodes, but currently has no N/V.  Pt states she has no diaphoresis, dizziness, lightheadedness, feels pre-syncopal or has syncopal episodes.  She states her BP is running WNLs, last reading at 117/84.  Pt states she has been experiencing an increased amount of heartburn as well, which she reports she is on a regimen for this. Pt states she does see Pulmonology as well, and she saw them about a week ago for this, and they did a CT Scan to rule out blood clot.  Pt states her CT came back normal, and no blood clots noted.  Pt states her Pulmonologist then placed her on tapered steroid, for exaccerbation of asthma, which seemed to improve her symptoms for a short time.  Pt states she will see Pulmonology in early November.  Pt states she is calling her GI MD to get an appt with them in the near future. Pt does have known CAD.  Scheduled the pt to come in and see Estella Husk PA-C for tomorrow 10/19 at 1:45 pm.  She is aware to arrive 15 mins prior to this visit.  ED precautions reviewed and provided to the pt, if symptoms were to persist or worsen between now and her appt tomorrow with Sharyn Lull. Pt education provided on when to use her nitroglycerin, being she has no used this thus far. Informed the pt that I will route this message to Dr. Meda Coffee as an Juluis Rainier, to make her aware of this plan. Pt verbalized understanding and agrees with this plan. Pt was  more than gracious for all the assistance provided.

## 2020-04-07 ENCOUNTER — Encounter: Payer: Self-pay | Admitting: Physician Assistant

## 2020-04-07 ENCOUNTER — Ambulatory Visit: Payer: PPO | Admitting: Physician Assistant

## 2020-04-07 ENCOUNTER — Other Ambulatory Visit: Payer: Self-pay

## 2020-04-07 VITALS — BP 112/80 | HR 88 | Ht 61.0 in | Wt 156.0 lb

## 2020-04-07 DIAGNOSIS — I25110 Atherosclerotic heart disease of native coronary artery with unstable angina pectoris: Secondary | ICD-10-CM

## 2020-04-07 DIAGNOSIS — E785 Hyperlipidemia, unspecified: Secondary | ICD-10-CM | POA: Diagnosis not present

## 2020-04-07 DIAGNOSIS — R072 Precordial pain: Secondary | ICD-10-CM

## 2020-04-07 NOTE — Patient Instructions (Signed)
Medication Instructions:  Your physician recommends that you continue on your current medications as directed. Please refer to the Current Medication list given to you today.  *If you need a refill on your cardiac medications before your next appointment, please call your pharmacy*   Lab Work: None If you have labs (blood work) drawn today and your tests are completely normal, you will receive your results only by:  [place] (if you have MyChart) OR  A paper copy in the mail If you have any lab test that is abnormal or we need to change your treatment, we will call you to review the results.   Testing/Procedures: None   Follow-Up: At [facility], you and your health needs are our priority.  As part of our continuing mission to provide you with exceptional heart care, we have created designated Provider Care Teams.  These Care Teams include your primary Cardiologist (physician) and Advanced Practice Providers (APPs -  Physician Assistants and Nurse Practitioners) who all work together to provide you with the care you need, when you need it.  We recommend signing up for the patient portal called "MyChart".  Sign up information is provided on this After Visit Summary.  MyChart is used to connect with patients for Virtual Visits (Telemedicine).  Patients are able to view lab/test results, encounter notes, upcoming appointments, etc.  Non-urgent messages can be sent to your provider as well.   To learn more about what you can do with MyChart, go to [email or web address].    Your next appointment:   09/08/2020  The format for your next appointment:   In Person  Provider:   Ena Dawley, MD

## 2020-04-20 ENCOUNTER — Other Ambulatory Visit: Payer: Self-pay

## 2020-04-20 ENCOUNTER — Ambulatory Visit: Payer: PPO

## 2020-04-20 ENCOUNTER — Other Ambulatory Visit (INDEPENDENT_AMBULATORY_CARE_PROVIDER_SITE_OTHER): Payer: PPO

## 2020-04-20 DIAGNOSIS — Z23 Encounter for immunization: Secondary | ICD-10-CM | POA: Diagnosis not present

## 2020-04-24 ENCOUNTER — Encounter: Payer: Self-pay | Admitting: Family Medicine

## 2020-04-24 ENCOUNTER — Other Ambulatory Visit: Payer: Self-pay | Admitting: Family Medicine

## 2020-04-24 DIAGNOSIS — M79672 Pain in left foot: Secondary | ICD-10-CM

## 2020-04-27 ENCOUNTER — Other Ambulatory Visit: Payer: Self-pay | Admitting: Family Medicine

## 2020-04-27 ENCOUNTER — Ambulatory Visit (HOSPITAL_BASED_OUTPATIENT_CLINIC_OR_DEPARTMENT_OTHER)
Admission: RE | Admit: 2020-04-27 | Discharge: 2020-04-27 | Disposition: A | Discharge status: Home / Self Care | Payer: PPO | Source: Ambulatory Visit | Attending: Family Medicine | Admitting: Family Medicine

## 2020-04-27 ENCOUNTER — Other Ambulatory Visit: Payer: Self-pay

## 2020-04-27 DIAGNOSIS — M25562 Pain in left knee: Secondary | ICD-10-CM

## 2020-04-27 DIAGNOSIS — M79672 Pain in left foot: Secondary | ICD-10-CM

## 2020-05-04 ENCOUNTER — Other Ambulatory Visit: Payer: Self-pay

## 2020-05-04 ENCOUNTER — Encounter: Payer: Self-pay | Admitting: Internal Medicine

## 2020-05-04 ENCOUNTER — Ambulatory Visit: Payer: PPO | Admitting: Internal Medicine

## 2020-05-04 ENCOUNTER — Telehealth: Payer: Self-pay | Admitting: Internal Medicine

## 2020-05-04 VITALS — BP 124/70 | HR 77 | Temp 98.5°F | Ht 61.75 in | Wt 156.0 lb

## 2020-05-04 DIAGNOSIS — J4541 Moderate persistent asthma with (acute) exacerbation: Secondary | ICD-10-CM | POA: Diagnosis not present

## 2020-05-04 DIAGNOSIS — K449 Diaphragmatic hernia without obstruction or gangrene: Secondary | ICD-10-CM | POA: Diagnosis not present

## 2020-05-04 DIAGNOSIS — K219 Gastro-esophageal reflux disease without esophagitis: Secondary | ICD-10-CM

## 2020-05-04 DIAGNOSIS — Z7185 Encounter for immunization safety counseling: Secondary | ICD-10-CM | POA: Diagnosis not present

## 2020-05-04 DIAGNOSIS — J4550 Severe persistent asthma, uncomplicated: Secondary | ICD-10-CM

## 2020-05-04 DIAGNOSIS — B4481 Allergic bronchopulmonary aspergillosis: Secondary | ICD-10-CM | POA: Diagnosis not present

## 2020-05-04 DIAGNOSIS — R1310 Dysphagia, unspecified: Secondary | ICD-10-CM

## 2020-05-04 NOTE — Telephone Encounter (Signed)
Please let patient know she can stop the Fosamax and we can try and get Prolia injections approved if she would be willing to take those twice a year. If she agrees then start the PA process.

## 2020-05-04 NOTE — Patient Instructions (Addendum)
ICD-10-CM   1. Severe persistent asthma with intensive monitoring  J45.50   2. A B P A-ALLERGIC BRONCHOPULMONARY ASPERGILLOSIS  B44.81   3. Moderate persistent asthmatic bronchitis with acute exacerbation  J45.41   4. Hiatal hernia  K44.9   5. Dysphagia, unspecified type  R13.10   6. Gastroesophageal reflux disease, unspecified whether esophagitis present  K21.9   7. Vaccine counseling  Z71.85     Severe persistent asthma with intensive monitoring A B P A-ALLERGIC BRONCHOPULMONARY ASPERGILLOSIS Moderate persistent asthmatic bronchitis with acute exacerbation  -At this point in time there is no evidence of Fasenra failure.  Respiratory infection and subsequent post viral reactive cough lasting for several weeks/few months is to be expected.  [name] just reduces the risk of future exacerbations and prednisone need.  Plan -At this point in time we will continue with [name] and your regular asthma medications -Certainly if you are having breakthrough respiratory exacerbations or need to go back on prednisone on a daily basis we can consider a newer biologic called  TEPEZULMAB  Hiatal hernia Dysphagia, unspecified type Gastroesophageal reflux disease, unspecified whether esophagitis present  -My real concern is the choking and sometimes associated wheezing that is happened in the last 24 hours.  I think this is because of the hiatal hernia and acid reflux disease.  I think Fosamax/alendronate is contributing to this at some level  -Also noted that Dr. Hilarie Fredrickson -recommended interventional procedure back in April 2021  Plan -Please talk to primary care physician and change of Fosamax to another agent -Please review with GI Dr. Hilarie Fredrickson future course but at this point in time definitely stop Fosamax  Vaccine counseling  -Okay to get Covid booster vaccine as long as you are at least several days out of her prednisone taper and he feels well   Followup 3 months or sooner if needed

## 2020-05-04 NOTE — Telephone Encounter (Signed)
Dear Marzetta Board and ccJay  Marzetta Board: CURSTIN SCHMALE has hiatal hernia with asthma as you know. She is having GERd and dysphagia. So wondered if you can stop her fosamax and put her on something else  Ulice Dash: I have encourged her to follow your recs for procedure. She does want to see how she does after dc fosafmax  Thanks  MR  PS: reply not needed

## 2020-05-04 NOTE — Progress Notes (Signed)
OV 10/06/2016  Chief Complaint  Patient presents with  . Follow-up    Pt states her asthma is good; had one flare up since last visit. SOB with exertion, cough with yellow to brown mucus prodcution, with frequent wheezing. Notice improvement with being on [employer]      64 year old female with severe asthma with ABPA and elevated IgE and eosinophilia. She is on IL5RAb antibody treatment now for the last few months. She feels this is helping her significantly. Her symptoms course of better. She is on Asmanex, Spiriva, Singulair as well. She is not on daily prednisone anymore. In December 2017 should she did grow MRSA from sputum culture. Currently a few days ago she developed sore throat and having some sputum production. She is worried this might be MRSA. Also asthma itself is stable and well controlled according to history. She is wondering about getting a sputum culture and really wishes for it    OV 01/11/2017  Chief Complaint  Patient presents with  . Follow-up    Pt states her breathing is doing well. Pt states the last couple days she has had more wheezing. Pt states she has noticed an improvement being on Cinqair.    64 year old female with severe asthma with ABPA and elevated IgE and eosinophilia. She is on IL5RAb antibody treatment.  She is on Asmanex, Spiriva, Singulair as well. She is not on daily prednisone anymore   01/11/2017 - Here for routine follow-up. She is on interleukin-5 receptor antibody IV infusion therapy. This was held a few weeks ago because of asthma exacerbation. She says approximately on Father's Day 2018 he started feeling unwell and then ended up with an exacerbation for which she needed antibiotics and prednisone. She is surprised by this decompensation despite being on this biologic immunotherapy for asthma. She is compliant with her Asmanex, Spiriva and Singulair. She is not on prednisone. Currently she feels baseline. Patient infusion #6 or 7 is due for  tomorrow [interleukin-5 receptor antibody). Overall she feels improved quality of life. Despite all this exhaled nitric oxide continues to be HIGH; unclear why   OV 04/21/2017  Chief Complaint  Patient presents with  . Follow-up    Pt states that she has been doing good. Has had no flare-ups with her asthma. Mild coughing at night. Denies any SOB or CP.  severe asthma with ABPA and elevated IgE and eosinophilia. She is on IL5RAb antibody treatment.  She is on Asmanex, Spiriva, Singulair as well. She is not on daily prednisone anymore  Continues cinqair spiriva, singulair, and asmanex. At this point in time the interleukin-5 receptor antibody is working really well for her. For nitric oxide today is lowest ever.. In terms of his symptoms she hardly ever gets up at night. When she wakes up she does not have any symptoms. She only has slight limitation with physical activity. She's not shortness of breath because of asthma not wheezing and not using albuterol for rescue use. The asthma control score is 0.8. However she is extremely frustrated by the [name] gravida cycle management of her interleukin-5 receptor antibody infusions. She is getting different bills wrong bills missed bills. Therefore she wants to switch to subcutaneous injectable therapy. She is very upset about the quality of care that she has received at the infusion center because of billing practices.  OV 10/19/2017  Chief Complaint  Patient presents with  . Follow-up    PFT done today. Pt states she has been  doing well. Has had a little more coughing and wheezing recently and thinks it could be due to the weather. Pt just got started back on Fasenra injections.   Severe persistent asthma with ABPA elevated IgE and eosinophilia. S/P cinqair interleukin-5 receptor antibody treatment ending" 2018 with good response but stopped secondary to billing errors at [facility] infusion center. Based on medical need then switched  to anti-eosinophil interleukin-5 receptor antibody subcutaneous injection treatment Fasenra early 2019. She is also on baseline Asmanex, Spiriva, Singulair and antihistamine 8. She is not on daily prednisone anymore.  Last seen November 2018. Overall she was doing well with good effort tolerance but in March 2019 had some atypical chest painand ended up with a coronary artery stent in the abuse marginal. She is improved from that. She is wondering about taking a beta blocker safely. She does not feel that his anemia because of blood pressure is low but apparently cardiology is asked her to clear this with me. In the interim her IV injection interleukin-5 receptor antibody has been changed to subcutaneous injection FASENRA . Because of her cardiac issues she's only taken 3 doses since January 2019. She hopes to go back on schedule with this. She had billing issues at the [facility] infusion center with the IV infusion and that is why we switched to the subcutaneous injection. However, it looks like the workflow authorization for this was not done correctly and she isn't of the large co-pay. She is unhappy about this and routinely discussed this with the patient. In terms of asthma she feels her cough is decompensated in the last few weeks and this correlates with her not taking her Asmanex. Her lung function test is near normal today. Her exhaled nitric oxide is slightly elevated than the lowest that she's had but still better than her baseline average. Currently she is waking up at nightcoughing because of the spring pollen but only hardly. Very mild symptoms when she wakes up she is slightly limited in the daytime and she experiences shortness of breath little of the time not using albuterol for rescue.    Results for Victoria, [name] (MRN [id number]) as of 10/19/2017 11:51  Ref. Range 10/19/2017 10:47  FEV1-Post Latest Units: L 1.80  FEV1-%Pred-Post Latest Units: % 76  FEV1-%Change-Post Latest Units: % 0    Results for Victoria, [name] (MRN [id number]) as of 10/19/2017 11:51  Ref. Range 10/19/2017 10:47  TLC Latest Units: L 4.90  TLC % pred Latest Units: % 103  Results for LAURIN, PAULO (MRN [id number]) as of 10/19/2017 11:51  Ref. Range 10/19/2017 10:47  DLCO unc Latest Units: ml/min/mmHg 19.51  DLCO unc % pred Latest Units: % 90    OV 05/14/2018  Subjective:  Patient ID: Victoria [name], female , DOB: 28-Jul-1955 , age 68 y.o. , MRN: [id number] , ADDRESS: [place] [address]   05/14/2018 -   Chief Complaint  Patient presents with  . Follow-up    breathing good, SOB on exertion   Severe persistent asthma with ABPA elevated IgE and eosinophilia. S/P cinqair interleukin-5 receptor antibody treatment ending" 2018 with good response but stopped secondary to billing errors at [facility] infusion center. Based on medical need then switched to anti-eosinophil interleukin-5 receptor antibody subcutaneous injection treatment Fasenra early 2019. She is also on baseline Asmanex, Spiriva, Singulair and antihistamine 8. She is not on daily prednisone anymore.  Known hiatal hernia  HPI INIOLUWA BOULAY 64 y.o. -returns for follow-up  of severe persistent asthma.  Earlier this year we switched her to interleukin-5 receptor antibody subcutaneous injection may be [employer] called Fasenra.  With this she feels her asthma symptoms have improved dramatically.  She feels it is better than seeing care.  She still thankful to the scientist of developed this biologic.  Her exam nitric oxide is the lowest ever at 27.  She is not having any nighttime symptoms or any wheezing.  In fact she stopped her inhaled steroid partly because she was feeling well but partly also because she is in the donut hole.  However she is continuing her Spiriva and Singulair.  She is up-to-date with her flu shot.    She is dealing with skin infections.  Primary care physician notes reviewed.  There is question whether this is  being precipitated by the fact she is on to Biologics.  She thinks she had MRSA but when I reviewed the chart on May 08, 2018 shows MSSA.  Therefore she is been started on Augmentin today which she is yet to start.  She also has hiatal hernia.  She has not seen GI in a few years.  It is fairly well-controlled and she plans to see GI soon.  OV 02/14/2019  Subjective:  Patient ID: Victoria [name], female , DOB: February 27, 1956 , age 50 y.o. , MRN: [id number] , ADDRESS: [place] [address]   02/14/2019 -   Chief Complaint  Patient presents with  . Severe persistent asthma with intensive monitoring   Severe persistent asthma with ABPA elevated IgE and eosinophilia. S/P cinqair interleukin-5 receptor antibody treatment ending" 2018 with good response but stopped secondary to billing errors at [facility] infusion center. Based on medical need then switched to anti-eosinophil interleukin-5 receptor antibody subcutaneous injection treatment Fasenra early 2019. She is  NOT on baseline Asmanex, Spiriva, Singulair and antihistamine 8. She is not on daily prednisone anymore.  Known hiatal hernia  HPI Victoria [name] 64 y.o. -is here for follow-up of asthma with ABPA.  Since her last visit she is only on biologic Fasenra.  She has stopped Singulair, Spiriva, Asmanex.  She is not taking daily prednisone.  Asthma is well controlled.  A few weeks ago she did have a itchy feeling intake epinephrine.  She also had wheeze at this time.  This helped control the situation.  Other than that she is doing well asthma is extremely well controlled.  No nocturnal awakenings no albuterol rescue use.  We are unable to do exhaled nitric oxide testing today because of the pandemic.  She told me that she wants to remove budesonide out of her allergy list because she is only allergic to [facility] and not the nebulizer.  She is doing social distancing.  She had an appointment earlier in the year with GI because of her  hiatal hernia but because of the pandemic she did not follow through.  She is open to having this appointment and referral made again.    Results for Victoria, [name] (MRN [id number]) as of 05/14/2018 10:52  Ref. Range 05/05/2015 13:45 07/17/2015 12:31 11/24/2015 12:31 10/06/2016 12:00 01/11/2017 12:54 04/21/2017 11:02 05/14/2018   Nitric Oxide Unknown 80 80 126 74 87 42 27    OV 05/04/2020  Subjective:  Patient ID: Victoria [name], female , DOB: December 05, 1955 , age 29 y.o. , MRN: [id number] , ADDRESS: [place] [address] PCP Mosie Lukes, MD Patient Care Team: Mosie Lukes, MD as PCP -  General (Family Medicine) Dorothy Spark, MD as PCP - Cardiology (Cardiology) [place], Jennefer Bravo, MD as Referring Physician (Dermatology) Brand Males, MD as Consulting Physician (Pulmonary Disease) Dorothy Spark, MD as Consulting Physician (Cardiology) Dian Queen, MD as Consulting Physician (Obstetrics and Gynecology) Melissa Montane, MD as Consulting Physician (Otolaryngology) Pyrtle, Lajuan Lines, MD as Consulting Physician (Gastroenterology) Pieter Partridge, DO as Consulting Physician (Neurology)  This Provider for this visit: Treatment Team:  Attending Provider: Brand Males, MD    05/04/2020 -   Chief Complaint  Patient presents with  . Follow-up    Pt c/o cough since 1 day ago- prod with yellow sputum. She has also been wheezing some. She uses her duoneb 4-6 x per wk.      HPI BRITTLYN CLOE 64 y.o. -presents for follow-up of a complicated asthma with ABPA.  She has been overall doing well with [name].  However in September 2021 after being exposed to someone with Covid she had respiratory exacerbation but she was ruled out for Covid.  She took 5 days of prednisone and did well but she had a cough that lingered through October 2021.  Then she developed chest pains.  We did a CT angiogram that ruled out PE.  I was involved in the decision making.  It only  showed hiatal hernia.  I personally visualized that CT again.  She can got worried that the [name] is not working because of that one respiratory exacerbation and the lingering cough.  In addition she tells me that yesterday while eating some crackers she choked and today she is wheezing.  She is noted to be on alendronate for the last 2 years.  This is by primary care physician.  In the spring 2020 when she did see GI Dr. Hilarie Fredrickson and interventional endoscopy has been recommended but she is so far holding off on that.  Her acid reflux regimen has been escalated.  She is up-to-date with her vaccine but is inquiring about Covid booster timing.   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), Anemia, Anxiety, Asthma, CAD (coronary artery disease), CAP (community acquired pneumonia) (2016; 06/07/2016), Chronic bronchitis ([place]), Chronic lower back pain, Complication of anesthesia, COPD (chronic obstructive pulmonary disease) ([place]), Depression, Diverticulitis, Diverticulosis, GERD (gastroesophageal reflux disease), H/O hiatal hernia, Headache, History of echocardiogram, Hyperglycemia (11/20/2015), Hyperlipidemia, mixed (09/11/2007), IBS (irritable bowel syndrome), Maxillary sinusitis, Normal cardiac stress test (11/2011), Obesity, OSA (obstructive sleep apnea) (02/2012), Osteoarthritis, Osteoporosis, Pneumonia (11/2011), Pulmonary nodules, S/P angioplasty with stent 08/23/17 ostial 2nd OM with DES synnergy (08/24/2017), and Schatzki's ring.   reports that she has never smoked. She has never used smokeless tobacco.  Past Surgical History:  Procedure Laterality Date  . APPENDECTOMY  1989  . CARDIAC CATHETERIZATION N/A 11/25/2014   Procedure: Right/Left Heart Cath and Coronary Angiography;  Surgeon: Belva Crome, MD;  Location: [facility];  Service: Cardiovascular;  Laterality: N/A;  . CESAREAN SECTION  1985  . CORONARY ANGIOPLASTY WITH STENT PLACEMENT  08/23/2017  . CORONARY STENT INTERVENTION N/A 08/23/2017   Procedure: CORONARY STENT INTERVENTION;  Surgeon: Burnell Blanks, MD;  Location: [facility];  Service: Cardiovascular;  Laterality: N/A;  . HERNIA REPAIR  04/13/2012   VHR laparoscopic  .  LEFT HEART CATH AND CORONARY ANGIOGRAPHY N/A 08/23/2017   Procedure: LEFT HEART CATH AND CORONARY ANGIOGRAPHY;  Surgeon: Burnell Blanks, MD;  Location: [facility];  Service: Cardiovascular;  Laterality: N/A;  . VENTRAL HERNIA REPAIR  04/13/2012   Procedure: LAPAROSCOPIC VENTRAL HERNIA;  Surgeon: Adin Hector, MD;  Location: [facility];  Service: General;  Laterality: N/A;  laparoscopic repair of incarcerated hernia    Allergies  Allergen Reactions  . Beclomethasone Dipropionate Hives and Other (See Comments)     weight gain  . Flexeril [Cyclobenzaprine] Anxiety  . Mometasone Furo-Formoterol Fum Hives and Other (See Comments)    weight gain  . Sulfonamide Derivatives Hives and Rash  . Statins     Myalgias, RLS    Immunization History  Administered Date(s) Administered  . Influenza Split 04/20/2011  . Influenza Whole 06/06/2007, 04/15/2008, 04/02/2009, 03/29/2012  . Influenza, High Dose Seasonal PF 05/05/2015  . Influenza,inj,Quad PF,6+ Mos 05/09/2013, 03/03/2014, 05/03/2016, 04/21/2017, 04/02/2018, 03/19/2019, 04/20/2020  . Moderna SARS-COVID-2 Vaccination 08/15/2019, 09/17/2019  . Pneumococcal Conjugate-13 05/09/2013  . Pneumococcal Polysaccharide-23 05/04/2005  . Td 07/29/2009  . Tdap 03/11/2015  . Zoster Recombinat (Shingrix) 05/09/2019, 08/01/2019    Family History  Problem Relation Age of Onset  . Breast cancer Mother   . Hypertension Mother   . Diabetes Mother   . Diverticulosis Father   . Prostate cancer Father   . Pulmonary embolism Brother        recurrent  .  Heart attack Maternal Grandfather   . Breast cancer Sister   . Stroke Neg Hx   . Colon cancer Neg Hx   . Esophageal cancer Neg Hx   . Stomach cancer Neg Hx   . Rectal cancer Neg Hx           Objective:   Vitals:   05/04/20 1052  BP: 124/70  Pulse: 77  Temp: 98.5 F (36.9 C)  TempSrc: Temporal  SpO2: 98%  Weight: 156 lb (70.8 kg)  Height: 5' 1.75" (1.568 m)    Estimated body mass index is 28.76 kg/m as calculated from the following:   Height as of this encounter: 5' 1.75" (1.568 m).   Weight as of this encounter: 156 lb (70.8 kg).  @WEIGHTCHANGE @  [employer]   05/04/20 1052  Weight: 156 lb (70.8 kg)     Physical Exam General: No distress. obese Neuro: Alert and Oriented x 3. GCS 15. Speech normal Psych: Pleasant Resp:  Barrel Chest - no.  Wheeze - no, Crackles - no, No overt respiratory distress CVS: Normal heart sounds. Murmurs - no Ext: Stigmata of Connective Tissue Disease - no HEENT: Normal upper airway. PEERL +. No post nasal drip        Assessment:       ICD-10-CM   1. Severe persistent asthma with intensive monitoring  J45.50   2. A B P A-ALLERGIC BRONCHOPULMONARY ASPERGILLOSIS  B44.81   3. Moderate persistent asthmatic bronchitis with acute exacerbation  J45.41   4. Hiatal hernia  K44.9   5. Dysphagia, unspecified type  R13.10   6. Gastroesophageal reflux disease, unspecified whether esophagitis present  K21.9   7. Vaccine counseling  Z71.85        Plan:     Patient Instructions     ICD-10-CM   1. Severe persistent asthma with intensive monitoring  J45.50   2. A B P A-ALLERGIC BRONCHOPULMONARY ASPERGILLOSIS  B44.81   3. Moderate persistent asthmatic bronchitis with acute exacerbation  J45.41  4. Hiatal hernia  K44.9   5. Dysphagia, unspecified type  R13.10   6. Gastroesophageal reflux disease, unspecified whether esophagitis present  K21.9   7. Vaccine counseling  Z71.85     Severe persistent asthma with intensive monitoring A  B P A-ALLERGIC BRONCHOPULMONARY ASPERGILLOSIS Moderate persistent asthmatic bronchitis with acute exacerbation  -At this point in time there is no evidence of Fasenra failure.  Respiratory infection and subsequent post viral reactive cough lasting for several weeks/few months is to be expected.  [name] just reduces the risk of future exacerbations and prednisone need.  Plan -At this point in time we will continue with [name] and your regular asthma medications -Certainly if you are having breakthrough respiratory exacerbations or need to go back on prednisone on a daily basis we can consider a newer biologic called  TEPEZULMAB  Hiatal hernia Dysphagia, unspecified type Gastroesophageal reflux disease, unspecified whether esophagitis present  -My real concern is the choking and sometimes associated wheezing that is happened in the last 24 hours.  I think this is because of the hiatal hernia and acid reflux disease.  I think Fosamax/alendronate is contributing to this at some level  -Also noted that Dr. Hilarie Fredrickson -recommended interventional procedure back in April 2021  Plan -Please talk to primary care physician and change of Fosamax to another agent -Please review with GI Dr. Hilarie Fredrickson future course but at this point in time definitely stop Fosamax  Vaccine counseling  -Okay to get Covid booster vaccine as long as you are at least several days out of her prednisone taper and he feels well   Followup 3 months or sooner if needed   ( Level 05 visit: Estb 40-54 min   in  visit type: on-site physical face to visit  in total care time and counseling or/and coordination of care by this undersigned MD - Dr Brand Males. This includes one or more of the following on this same day 05/04/2020: pre-charting, chart review, note writing, documentation discussion of test results, diagnostic or treatment recommendations, prognosis, risks and benefits of management options, instructions, education,  compliance or risk-factor reduction. It excludes time spent by the [place] or office staff in the care of the patient. Actual time 45 min)   SIGNATURE    Dr. Brand Males, M.D., F.C.C.P,  Pulmonary and Critical Care Medicine Staff Physician, [place] Director - Interstitial Lung Disease  Program  Pulmonary [place] at [place], [place], [address]  Pager: [phone], If no answer or between  15:00h - 7:00h: call [phone] Telephone: [phone]  11:28 AM 05/04/2020

## 2020-05-04 NOTE — Telephone Encounter (Signed)
Victoria [name], let patient know that we will see how GERD symptoms do on the pantoprazole after stopping the fosamax If not controlled she should let me know Thanks

## 2020-05-04 NOTE — Telephone Encounter (Signed)
Spoke with pt and she is aware.

## 2020-05-05 ENCOUNTER — Other Ambulatory Visit: Payer: Self-pay | Admitting: Cardiology

## 2020-05-05 NOTE — Telephone Encounter (Signed)
Sent in patients clinical info to insurance. Waiting on determination of cost and coverage. This can take a couple of days. Will call patient to schedule once received.

## 2020-05-06 ENCOUNTER — Telehealth: Payer: Self-pay | Admitting: Internal Medicine

## 2020-05-06 MED ORDER — IPRATROPIUM-ALBUTEROL 0.5-2.5 (3) MG/3ML IN SOLN: 3.0000 mL | Freq: Four times a day (QID) | RESPIRATORY_TRACT | 5 refills | Status: DC | PRN

## 2020-05-06 MED ORDER — PREDNISONE 20 MG PO TABS: 20.0000 mg | ORAL_TABLET | Freq: Every day | ORAL | 0 refills | Status: DC

## 2020-05-06 MED ORDER — BENZONATATE 200 MG PO CAPS: 200.0000 mg | ORAL_CAPSULE | Freq: Three times a day (TID) | ORAL | 1 refills | Status: DC | PRN

## 2020-05-06 NOTE — Telephone Encounter (Signed)
I apologize that I did not put this in my original message but pt stated that OTC cough syrups "do nothing for her."  MR - please advise if you will give the pt a prednisone and cough syrup rx. Thanks.

## 2020-05-06 NOTE — Telephone Encounter (Signed)
Called and spoke with patient, advised of recommendations by Rexene Edison NP.  She verbalized understanding.  Called Tammy Parrett NP and received a verbal order for duneb, sent to pharmacy.  Nothing further needed.

## 2020-05-06 NOTE — Telephone Encounter (Signed)
Patient is returning phone call. Patient phone number is [phone].

## 2020-05-06 NOTE — Telephone Encounter (Signed)
Patient has severe asthma, and history of ABPA Was seen May 04, 2020. Is on Fasenra. Started here that she is still coughing.  We can call in a short course of prednisone to see if this helps.  I would recommend prednisone 20 mg daily for 5 days. Recommend Delsym 2 teaspoons twice daily for cough. She may have Tessalon 200 mg 3 times daily as needed for cough #30 with 1 refill  Please contact office for sooner follow up if symptoms do not improve or worsen or seek emergency care     I will send a note to Dr. Chase Caller as I do not see any maintenance inhalers for her asthma.  Not sure if these fell off her med list or she is no longer taking that would also be a consideration but will leave to Dr. Chase Caller as I am not familiar with this patient You can also check with Dr. Chase Caller for Tussionex prescription if he approves

## 2020-05-06 NOTE — Telephone Encounter (Addendum)
Spoke with pt. States that her cough has gotten worse since her appointment with MR on Monday. Cough is deep, "barking" and non productive. Denies chest tightness, wheezing, shortness of breath, fever or sick contacts. When she was here for her appointment on Monday, MR did not want to give her any medication to help with this as she only had the cough for 1 day. Cough is now progressively worse. Pt is requesting prednisone and Tussionex cough syrup.  TP - please advise as MR is working nightfloat. Thanks!

## 2020-05-11 NOTE — Telephone Encounter (Signed)
Patient was seen by Dr. Chase Caller last week in office , she called in after visit and was called in something , please send to him for his advice to see if wants something different Sorry to hear she is not better.   Please contact office for sooner follow up if symptoms do not improve or worsen or seek emergency care

## 2020-05-11 NOTE — Telephone Encounter (Signed)
MR please advise.  See note from TP.  Thanks

## 2020-05-11 NOTE — Telephone Encounter (Signed)
TP pt is requesting that tussionex be sent in to help with her cough that does not seem to be getting better. Pt stated that this has always helped her and she has not had this refilled since June.  Please advise. Thanks

## 2020-05-12 NOTE — Telephone Encounter (Signed)
MR please advise  UPDATE - I think I've finally turned the corner.........that continuous nagging coughing is rough day after day after day.  It's so hard on my head, my ribs, sternum and lately, I feel like it's been pulling on the abdominal mesh I have on left side of abdomen.   So I really needed the cough to subside, which I hope it finally has. Thank you

## 2020-05-13 NOTE — Telephone Encounter (Signed)
If she wants she can do Please take prednisone 40 mg x1 day, then 30 mg x1 day, then 20 mg x1 day, then 10 mg x1 day, and then 5 mg x1 day and stop

## 2020-05-18 ENCOUNTER — Other Ambulatory Visit: Payer: Self-pay | Admitting: *Deleted

## 2020-05-18 ENCOUNTER — Telehealth: Payer: Self-pay | Admitting: Internal Medicine

## 2020-05-18 DIAGNOSIS — L814 Other melanin hyperpigmentation: Secondary | ICD-10-CM | POA: Diagnosis not present

## 2020-05-18 DIAGNOSIS — L821 Other seborrheic keratosis: Secondary | ICD-10-CM | POA: Diagnosis not present

## 2020-05-18 DIAGNOSIS — D2271 Melanocytic nevi of right lower limb, including hip: Secondary | ICD-10-CM | POA: Diagnosis not present

## 2020-05-18 DIAGNOSIS — D225 Melanocytic nevi of trunk: Secondary | ICD-10-CM | POA: Diagnosis not present

## 2020-05-18 DIAGNOSIS — D1800 Hemangioma unspecified site: Secondary | ICD-10-CM | POA: Diagnosis not present

## 2020-05-18 DIAGNOSIS — B079 Viral wart, unspecified: Secondary | ICD-10-CM | POA: Diagnosis not present

## 2020-05-18 MED ORDER — FASENRA 30 MG/ML ~~LOC~~ SOSY: 1.0000 mL | PREFILLED_SYRINGE | SUBCUTANEOUS | 5 refills | Status: DC

## 2020-05-18 NOTE — Telephone Encounter (Signed)
[name] Order: 30mg  #1 prefilled syringe Ordered date: 05/18/20 Expected date of arrival: 05/21/20-05/22/20 Ordered by: [place]: [employer]

## 2020-05-21 NOTE — Telephone Encounter (Signed)
Fasenra Shipment Received:  30mg  #1 prefilled syringe Medication arrival date: 05/21/20 Lot #: [id number] Exp date: 05/19/2021 Received by: Elliot Dally

## 2020-05-26 ENCOUNTER — Telehealth: Payer: Self-pay | Admitting: Internal Medicine

## 2020-05-26 ENCOUNTER — Other Ambulatory Visit: Payer: Self-pay

## 2020-05-26 ENCOUNTER — Ambulatory Visit (INDEPENDENT_AMBULATORY_CARE_PROVIDER_SITE_OTHER): Payer: PPO

## 2020-05-26 DIAGNOSIS — J4550 Severe persistent asthma, uncomplicated: Secondary | ICD-10-CM | POA: Diagnosis not present

## 2020-05-26 DIAGNOSIS — J4541 Moderate persistent asthma with (acute) exacerbation: Secondary | ICD-10-CM | POA: Diagnosis not present

## 2020-05-26 MED ORDER — BENRALIZUMAB 30 MG/ML ~~LOC~~ SOSY
30.0000 mg | PREFILLED_SYRINGE | Freq: Once | SUBCUTANEOUS | Status: AC
  Administered 2020-05-26: 30 mg via SUBCUTANEOUS

## 2020-05-26 NOTE — Telephone Encounter (Signed)
Patient receives free Fasenra through [employer] Patient assistance, will just need new rx for pens sent to program when she is ready to transition.

## 2020-05-26 NOTE — Telephone Encounter (Signed)
Patient came in to office today for Mobile injection.  I did explain and discuss home injections.  Patient stated she does a home injection now and would be comfortable doing her own Fasenra injections, as long as her cost was still $0.  Message routed to Dr. Chase Caller and Pharmacy Team

## 2020-05-26 NOTE — Progress Notes (Signed)
Have you been hospitalized within the last 10 days?  No Do you have a fever?  No Do you have a cough?  No Do you have a headache or sore throat? No  

## 2020-05-29 MED ORDER — FASENRA PEN 30 MG/ML ~~LOC~~ SOAJ: 30.0000 mg | SUBCUTANEOUS | 5 refills | Status: DC

## 2020-05-29 NOTE — Telephone Encounter (Signed)
Ok with me 

## 2020-05-29 NOTE — Telephone Encounter (Signed)
New Fasenra pen prescription sent for self training/home injections. Patient can be transitioned at next injection appointment.

## 2020-06-02 ENCOUNTER — Other Ambulatory Visit: Payer: Self-pay | Admitting: Family Medicine

## 2020-06-03 NOTE — Telephone Encounter (Signed)
Not sure which dose patient decided to stay on. Please clarify with her what she is actually currently taking and then we can refill that.

## 2020-06-03 NOTE — Telephone Encounter (Signed)
Can you clarify dose on Effexor-XR- is she on both doses?

## 2020-06-04 ENCOUNTER — Other Ambulatory Visit: Payer: Self-pay | Admitting: Family Medicine

## 2020-06-04 ENCOUNTER — Telehealth: Payer: Self-pay | Admitting: *Deleted

## 2020-06-04 DIAGNOSIS — M5030 Other cervical disc degeneration, unspecified cervical region: Secondary | ICD-10-CM

## 2020-06-04 DIAGNOSIS — M542 Cervicalgia: Secondary | ICD-10-CM

## 2020-06-04 NOTE — Telephone Encounter (Signed)
I placed the referral.

## 2020-06-04 NOTE — Telephone Encounter (Signed)
Patient notified and verbalized understanding of referral placed.

## 2020-06-04 NOTE — Telephone Encounter (Signed)
Called patient another issue and she wanted to see if you would refer her to the neurosurgeon again.  She did not go the last time because she wanted to try the physical therapy.  She has been sick the past couple of months with a severe cough and think that it may have started to make her neck pain, face pain, and headaches come back.  Are you ok with referral?  I did offer her an appointment with another provider but she declined and she said she will see you on 1/11.  I advised her that if her pain get severe enough that another provider will be happy to see her or she needs to be seen immediately.

## 2020-06-04 NOTE — Telephone Encounter (Signed)
Patient stated that she is still on all doses and needed both refilled of the 150mg  and 75mg .  I will put a note to discuss at [facility]pe about this.  She has not titrated at all as what was discussed on 02/18/20 in the [place] message.

## 2020-06-05 DIAGNOSIS — R030 Elevated blood-pressure reading, without diagnosis of hypertension: Secondary | ICD-10-CM | POA: Diagnosis not present

## 2020-06-05 DIAGNOSIS — M542 Cervicalgia: Secondary | ICD-10-CM | POA: Diagnosis not present

## 2020-06-05 DIAGNOSIS — Z6829 Body mass index (BMI) 29.0-29.9, adult: Secondary | ICD-10-CM | POA: Diagnosis not present

## 2020-06-23 DIAGNOSIS — M545 Low back pain, unspecified: Secondary | ICD-10-CM | POA: Diagnosis not present

## 2020-06-23 DIAGNOSIS — M542 Cervicalgia: Secondary | ICD-10-CM | POA: Diagnosis not present

## 2020-06-30 ENCOUNTER — Encounter: Payer: Self-pay | Admitting: Family Medicine

## 2020-06-30 ENCOUNTER — Ambulatory Visit (HOSPITAL_BASED_OUTPATIENT_CLINIC_OR_DEPARTMENT_OTHER)
Admission: RE | Admit: 2020-06-30 | Discharge: 2020-06-30 | Disposition: A | Discharge status: Home / Self Care | Payer: PPO | Source: Ambulatory Visit | Attending: Family Medicine | Admitting: Family Medicine

## 2020-06-30 ENCOUNTER — Other Ambulatory Visit: Payer: Self-pay

## 2020-06-30 ENCOUNTER — Ambulatory Visit (INDEPENDENT_AMBULATORY_CARE_PROVIDER_SITE_OTHER): Payer: PPO | Admitting: Family Medicine

## 2020-06-30 VITALS — BP 122/80 | HR 81 | Temp 98.3°F | Resp 15 | Wt 155.2 lb

## 2020-06-30 DIAGNOSIS — R1012 Left upper quadrant pain: Secondary | ICD-10-CM | POA: Diagnosis not present

## 2020-06-30 DIAGNOSIS — Z789 Other specified health status: Secondary | ICD-10-CM

## 2020-06-30 DIAGNOSIS — M5030 Other cervical disc degeneration, unspecified cervical region: Secondary | ICD-10-CM

## 2020-06-30 DIAGNOSIS — R109 Unspecified abdominal pain: Secondary | ICD-10-CM | POA: Diagnosis not present

## 2020-06-30 DIAGNOSIS — R739 Hyperglycemia, unspecified: Secondary | ICD-10-CM | POA: Diagnosis not present

## 2020-06-30 DIAGNOSIS — E782 Mixed hyperlipidemia: Secondary | ICD-10-CM

## 2020-06-30 DIAGNOSIS — M545 Low back pain, unspecified: Secondary | ICD-10-CM

## 2020-06-30 DIAGNOSIS — G8929 Other chronic pain: Secondary | ICD-10-CM

## 2020-06-30 DIAGNOSIS — M25512 Pain in left shoulder: Secondary | ICD-10-CM | POA: Diagnosis not present

## 2020-06-30 DIAGNOSIS — M810 Age-related osteoporosis without current pathological fracture: Secondary | ICD-10-CM

## 2020-06-30 DIAGNOSIS — K649 Unspecified hemorrhoids: Secondary | ICD-10-CM

## 2020-06-30 DIAGNOSIS — R519 Headache, unspecified: Secondary | ICD-10-CM

## 2020-06-30 DIAGNOSIS — M546 Pain in thoracic spine: Secondary | ICD-10-CM

## 2020-06-30 DIAGNOSIS — M542 Cervicalgia: Secondary | ICD-10-CM | POA: Diagnosis not present

## 2020-06-30 DIAGNOSIS — Z0000 Encounter for general adult medical examination without abnormal findings: Secondary | ICD-10-CM

## 2020-06-30 DIAGNOSIS — J4540 Moderate persistent asthma, uncomplicated: Secondary | ICD-10-CM

## 2020-06-30 DIAGNOSIS — M62838 Other muscle spasm: Secondary | ICD-10-CM

## 2020-06-30 DIAGNOSIS — M1990 Unspecified osteoarthritis, unspecified site: Secondary | ICD-10-CM

## 2020-06-30 DIAGNOSIS — R350 Frequency of micturition: Secondary | ICD-10-CM | POA: Diagnosis not present

## 2020-06-30 DIAGNOSIS — J4550 Severe persistent asthma, uncomplicated: Secondary | ICD-10-CM

## 2020-06-30 MED ORDER — HYDROCOD POLST-CPM POLST ER 10-8 MG/5ML PO SUER: 5.0000 mL | Freq: Two times a day (BID) | ORAL | 0 refills | Status: DC | PRN

## 2020-06-30 MED ORDER — GABAPENTIN 100 MG PO CAPS: 100.0000 mg | ORAL_CAPSULE | Freq: Three times a day (TID) | ORAL | 2 refills | Status: DC

## 2020-06-30 NOTE — Patient Instructions (Signed)
Preventive Care 84-64 Years Old, Female Preventive care refers to lifestyle choices and visits with your health care provider that can promote health and wellness. This includes:  A yearly physical exam. This is also called an annual wellness visit.  Regular dental and eye exams.  Immunizations.  Screening for certain conditions.  Healthy lifestyle choices, such as: ? Eating a healthy diet. ? Getting regular exercise. ? Not using drugs or products that contain nicotine and tobacco. ? Limiting alcohol use. What can I expect for my preventive care visit? Physical exam Your health care provider will check your:  Height and weight. These may be used to calculate your BMI (body mass index). BMI is a measurement that tells if you are at a healthy weight.  Heart rate and blood pressure.  Body temperature.  Skin for abnormal spots. Counseling Your health care provider may ask you questions about your:  Past medical problems.  Family's medical history.  Alcohol, tobacco, and drug use.  Emotional well-being.  Home life and relationship well-being.  Sexual activity.  Diet, exercise, and sleep habits.  Work and work Statistician.  Access to firearms.  Method of birth control.  Menstrual cycle.  Pregnancy history. What immunizations do I need? Vaccines are usually given at various ages, according to a schedule. Your health care provider will recommend vaccines for you based on your age, medical history, and lifestyle or other factors, such as travel or where you work.   What tests do I need? Blood tests  Lipid and cholesterol levels. These may be checked every 5 years, or more often if you are over 3 years old.  Hepatitis C test.  Hepatitis B test. Screening  Lung cancer screening. You may have this screening every year starting at age 73 if you have a 30-pack-year history of smoking and currently smoke or have quit within the past 15 years.  Colorectal cancer  screening. ? All adults should have this screening starting at age 52 and continuing until age 17. ? Your health care provider may recommend screening at age 49 if you are at increased risk. ? You will have tests every 1-10 years, depending on your results and the type of screening test.  Diabetes screening. ? This is done by checking your blood sugar (glucose) after you have not eaten for a while (fasting). ? You may have this done every 1-3 years.  Mammogram. ? This may be done every 1-2 years. ? Talk with your health care provider about when you should start having regular mammograms. This may depend on whether you have a family history of breast cancer.  BRCA-related cancer screening. This may be done if you have a family history of breast, ovarian, tubal, or peritoneal cancers.  Pelvic exam and Pap test. ? This may be done every 3 years starting at age 10. ? Starting at age 11, this may be done every 5 years if you have a Pap test in combination with an HPV test. Other tests  STD (sexually transmitted disease) testing, if you are at risk.  Bone density scan. This is done to screen for osteoporosis. You may have this scan if you are at high risk for osteoporosis. Talk with your health care provider about your test results, treatment options, and if necessary, the need for more tests. Follow these instructions at home: Eating and drinking  Eat a diet that includes fresh fruits and vegetables, whole grains, lean protein, and low-fat dairy products.  Take vitamin and mineral supplements  as recommended by your health care provider.  Do not drink alcohol if: ? Your health care provider tells you not to drink. ? You are pregnant, may be pregnant, or are planning to become pregnant.  If you drink alcohol: ? Limit how much you have to 0-1 drink a day. ? Be aware of how much alcohol is in your drink. In the [place] one drink equals one 12 oz bottle of beer (355 mL), one 5 oz glass of  wine (148 mL), or one 1 oz glass of hard liquor (44 mL).   Lifestyle  Take daily care of your teeth and gums. Brush your teeth every morning and night with fluoride toothpaste. Floss one time each day.  Stay active. Exercise for at least 30 minutes 5 or more days each week.  Do not use any products that contain nicotine or tobacco, such as cigarettes, e-cigarettes, and chewing tobacco. If you need help quitting, ask your health care provider.  Do not use drugs.  If you are sexually active, practice safe sex. Use a condom or other form of protection to prevent STIs (sexually transmitted infections).  If you do not wish to become pregnant, use a form of birth control. If you plan to become pregnant, see your health care provider for a prepregnancy visit.  If told by your health care provider, take low-dose aspirin daily starting at age 50.  Find healthy ways to cope with stress, such as: ? Meditation, yoga, or listening to music. ? Journaling. ? Talking to a trusted person. ? Spending time with friends and family. Safety  Always wear your seat belt while driving or riding in a vehicle.  Do not drive: ? If you have been drinking alcohol. Do not ride with someone who has been drinking. ? When you are tired or distracted. ? While texting.  Wear a helmet and other protective equipment during sports activities.  If you have firearms in your house, make sure you follow all gun safety procedures. What's next?  Visit your health care provider once a year for an annual wellness visit.  Ask your health care provider how often you should have your eyes and teeth checked.  Stay up to date on all vaccines. This information is not intended to replace advice given to you by your health care provider. Make sure you discuss any questions you have with your health care provider. Document Revised: 03/10/2020 Document Reviewed: 02/15/2018 Elsevier Patient Education  2021 [employer]  

## 2020-07-01 ENCOUNTER — Other Ambulatory Visit: Payer: Self-pay | Admitting: Family Medicine

## 2020-07-01 DIAGNOSIS — R1012 Left upper quadrant pain: Secondary | ICD-10-CM | POA: Insufficient documentation

## 2020-07-01 DIAGNOSIS — M542 Cervicalgia: Secondary | ICD-10-CM | POA: Diagnosis not present

## 2020-07-01 DIAGNOSIS — Z0000 Encounter for general adult medical examination without abnormal findings: Secondary | ICD-10-CM | POA: Insufficient documentation

## 2020-07-01 DIAGNOSIS — M545 Low back pain, unspecified: Secondary | ICD-10-CM | POA: Diagnosis not present

## 2020-07-01 DIAGNOSIS — K649 Unspecified hemorrhoids: Secondary | ICD-10-CM | POA: Insufficient documentation

## 2020-07-01 DIAGNOSIS — M25512 Pain in left shoulder: Secondary | ICD-10-CM | POA: Insufficient documentation

## 2020-07-01 DIAGNOSIS — J984 Other disorders of lung: Secondary | ICD-10-CM

## 2020-07-01 LAB — COMPREHENSIVE METABOLIC PANEL
ALT: 15 U/L (ref 0–35)
AST: 17 U/L (ref 0–37)
Albumin: 4.4 g/dL (ref 3.5–5.2)
Alkaline Phosphatase: 72 U/L (ref 39–117)
BUN: 14 mg/dL (ref 6–23)
CO2: 30 mEq/L (ref 19–32)
Calcium: 9.4 mg/dL (ref 8.4–10.5)
Chloride: 101 mEq/L (ref 96–112)
Creatinine, Ser: 0.64 mg/dL (ref 0.40–1.20)
GFR: 94.23 mL/min (ref >60.00)
Glucose, Bld: 79 mg/dL (ref 70–99)
Potassium: 4.7 mEq/L (ref 3.5–5.1)
Sodium: 136 mEq/L (ref 135–145)
Total Bilirubin: 0.3 mg/dL (ref 0.2–1.2)
Total Protein: 6.5 g/dL (ref 6.0–8.3)

## 2020-07-01 LAB — URINALYSIS
Bilirubin Urine: NEGATIVE
Hgb urine dipstick: NEGATIVE
Ketones, ur: NEGATIVE
Leukocytes,Ua: NEGATIVE
Nitrite: NEGATIVE
Specific Gravity, Urine: 1.015 (ref 1.000–1.030)
Total Protein, Urine: NEGATIVE
Urine Glucose: NEGATIVE
Urobilinogen, UA: 0.2 (ref 0.0–1.0)
pH: 7 (ref 5.0–8.0)

## 2020-07-01 LAB — LIPID PANEL
Cholesterol: 166 mg/dL (ref 0–200)
HDL: 62.8 mg/dL (ref >39.00)
LDL Cholesterol: 85 mg/dL (ref 0–99)
NonHDL: 103.41
Total CHOL/HDL Ratio: 3
Triglycerides: 92 mg/dL (ref 0.0–149.0)
VLDL: 18.4 mg/dL (ref 0.0–40.0)

## 2020-07-01 LAB — CBC WITH DIFFERENTIAL/PLATELET
Basophils Absolute: 0.1 10*3/uL (ref 0.0–0.1)
Basophils Relative: 0.9 % (ref 0.0–3.0)
Eosinophils Absolute: 0 10*3/uL (ref 0.0–0.7)
Eosinophils Relative: 0 % (ref 0.0–5.0)
HCT: 38.8 % (ref 36.0–46.0)
Hemoglobin: 12.9 g/dL (ref 12.0–15.0)
Lymphocytes Relative: 19.3 % (ref 12.0–46.0)
Lymphs Abs: 1.4 10*3/uL (ref 0.7–4.0)
MCHC: 33.2 g/dL (ref 30.0–36.0)
MCV: 90.1 fl (ref 78.0–100.0)
Monocytes Absolute: 0.5 10*3/uL (ref 0.1–1.0)
Monocytes Relative: 6.8 % (ref 3.0–12.0)
Neutro Abs: 5.3 10*3/uL (ref 1.4–7.7)
Neutrophils Relative %: 73 % (ref 43.0–77.0)
Platelets: 304 10*3/uL (ref 150.0–400.0)
RBC: 4.3 Mil/uL (ref 3.87–5.11)
RDW: 13.8 % (ref 11.5–15.5)
WBC: 7.3 10*3/uL (ref 4.0–10.5)

## 2020-07-01 LAB — TSH: TSH: 2.12 u[IU]/mL (ref 0.35–4.50)

## 2020-07-01 LAB — AMYLASE: Amylase: 33 U/L (ref 27–131)

## 2020-07-01 LAB — HEMOGLOBIN A1C: Hgb A1c MFr Bld: 5.5 % (ref 4.6–6.5)

## 2020-07-01 LAB — LIPASE: Lipase: 34 U/L (ref 11.0–59.0)

## 2020-07-01 NOTE — Assessment & Plan Note (Signed)
She does not tolerate statins

## 2020-07-01 NOTE — Assessment & Plan Note (Signed)
Check xrays and continue PT

## 2020-07-01 NOTE — Assessment & Plan Note (Signed)
Encouraged moist heat and gentle stretching as tolerated. May try NSAIDs and prescription meds as directed and report if symptoms worsen or seek immediate care 

## 2020-07-01 NOTE — Assessment & Plan Note (Signed)
Check xrays and monitor. She will report if her pain worsens

## 2020-07-01 NOTE — Assessment & Plan Note (Signed)
Following with pulmonology and is doing better with current treatments.

## 2020-07-01 NOTE — Assessment & Plan Note (Signed)
Encouraged increased hydration, 64 ounces of clear fluids daily. Minimize alcohol and caffeine. Eat small frequent meals with lean proteins and complex carbs. Avoid high and low blood sugars. Get adequate sleep, 7-8 hours a night. Needs exercise daily preferably in the morning. She has been following with neurology

## 2020-07-01 NOTE — Assessment & Plan Note (Signed)
Patient encouraged to maintain heart healthy diet, regular exercise, adequate sleep. Consider daily probiotics. Take medications as prescribed. Labs ordered and reviewed.MM was August 2021, repeat annually, colonoscopy June 2021 no polyps repeat colonoscopy in 10 years.

## 2020-07-01 NOTE — Assessment & Plan Note (Signed)
Encouraged heart healthy diet, increase exercise, avoid trans fats, consider a krill oil cap daily 

## 2020-07-01 NOTE — Assessment & Plan Note (Signed)
hgba1c acceptable, minimize simple carbs. Increase exercise as tolerated.  

## 2020-07-01 NOTE — Progress Notes (Signed)
Subjective:    Patient ID: Victoria [name], female    DOB: [birth year], 65 y.o.   MRN: [id number]  Chief Complaint  Patient presents with  . Annual Exam    Pt says she is taking 150 of Effexor now. Pap and mamm were done Aug 2021 with Dr Helane Rima.    HPI Patient is in today for annual preventative exam and follow up on chronic medical concerns. She is trying to stay active and maintain a heart healthy diet. She is mostly noting worsening pain, headaches, neck pain, back pain, generalized myalgias. No falls or recent trauma. She is denying any febrile illness or hospitalizations. Denies CP/palp/SOB/HA/congestion/fevers/GI or GU c/o. Taking meds as prescribed  Past Medical History:  Diagnosis Date  . Allergic bronchopulmonary aspergillosis ([place]) 2008   sees Dr Edmund Hilda pulmonology  . Anemia    iron deficiency, resolved  . Anxiety   . Asthma   . CAD (coronary artery disease)    a. LHC 6/16:  oOM1 60, pRCA 25 >> med Rx b. cath 3/19 2nd OM with 95% stenosis s/p synergy DES & anomalous RCA  . CAP (community acquired pneumonia) 2016; 06/07/2016  . Chronic bronchitis ([place])   . Chronic lower back pain   . Complication of anesthesia    "think I have a hard time waking up from it"  . COPD (chronic obstructive pulmonary disease) ([place])   . Depression    mild  . Diverticulitis   . Diverticulosis   . GERD (gastroesophageal reflux disease)   . H/O hiatal hernia   . Headache    "weekly" (08/23/2017)  . History of echocardiogram    Echo 6/16:  Mod LVH, EF 60-65%, no RWMA, Gr 1 DD, trivial MR, normal LA size.  Marland Kitchen Hyperglycemia 11/20/2015  . Hyperlipidemia, mixed 09/11/2007   Qualifier: Diagnosis of  By: Jerold Coombe   Did not tolerate Lipitor, zocor, Lovastatin, Pravastatin, Livalo, Crestor even low dose   . IBS (irritable bowel syndrome)   . Maxillary sinusitis   . Normal cardiac stress test 11/2011   No evidence of ischemia or infarct. Calculated ejection fraction 72%.  .  Obesity   . OSA (obstructive sleep apnea) 02/2012   has stopped using  cpap  . Osteoarthritis   . Osteoporosis   . Pneumonia 11/2011   "before 2013 I hadn't had pneumonia since I was a child" (04/13/2012)  . Pulmonary nodules   . S/P angioplasty with stent 08/23/17 ostial 2nd OM with DES synnergy 08/24/2017  . Schatzki's ring     Past Surgical History:  Procedure Laterality Date  . APPENDECTOMY  1989  . CARDIAC CATHETERIZATION N/A 11/25/2014   Procedure: Right/Left Heart Cath and Coronary Angiography;  Surgeon: Belva Crome, MD;  Location: [facility];  Service: Cardiovascular;  Laterality: N/A;  . CESAREAN SECTION  1985  . CORONARY ANGIOPLASTY WITH STENT PLACEMENT  08/23/2017  . CORONARY STENT INTERVENTION N/A 08/23/2017   Procedure: CORONARY STENT INTERVENTION;  Surgeon: Burnell Blanks, MD;  Location: [facility];  Service: Cardiovascular;  Laterality: N/A;  . HERNIA REPAIR  04/13/2012   VHR laparoscopic  . LEFT HEART CATH AND CORONARY ANGIOGRAPHY N/A 08/23/2017   Procedure: LEFT HEART CATH AND CORONARY ANGIOGRAPHY;  Surgeon: Burnell Blanks, MD;  Location: [facility];  Service: Cardiovascular;  Laterality: N/A;  . VENTRAL HERNIA REPAIR  04/13/2012   Procedure: LAPAROSCOPIC VENTRAL HERNIA;  Surgeon: Adin Hector, MD;  Location: [facility];  Service: General;  Laterality: N/A;  laparoscopic repair of incarcerated hernia    Family History  Problem Relation Age of Onset  . Breast cancer Mother   . Hypertension Mother   . Diabetes Mother   . Cancer Mother        recurrent, metastatic breast cancer.   . Diverticulosis Father   . Prostate cancer Father   . Pulmonary embolism Brother        recurrent  . Heart attack Maternal Grandfather   . Breast cancer Sister   . Cancer Sister        breast cancer, invasive ductal carcinoma in 2022,DCIS at 47 with 4 weeks of radiation, 5 years of Tamoxifen   . Stroke Neg Hx   . Colon cancer Neg Hx   . Esophageal  cancer Neg Hx   . Stomach cancer Neg Hx   . Rectal cancer Neg Hx     Social History   Socioeconomic History  . Marital status: Married    Spouse name: Not on file  . Number of children: 1  . Years of education: 50  . Highest education level: Associate degree: academic program  Occupational History  . Occupation: Disabled   Tobacco Use  . Smoking status: Never Smoker  . Smokeless tobacco: Never Used  Vaping Use  . Vaping Use: Never used  Substance and Sexual Activity  . Alcohol use: Yes    Comment: social use  . Drug use: No  . Sexual activity: Yes    Comment: gluten free, lives with husband and son with CP quadriplegia  Other Topics Concern  . 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    Lives at home with her husband and son.   Right-handed.   2 cups caffeine per day.   One story home   Social Determinants of Health   Financial Resource Strain: Not on file  Food Insecurity: Not on file  Transportation Needs: Not on file  Physical Activity: Not on file  Stress: Not on file  Social Connections: Not on file  Intimate Partner Violence: Not on file    Outpatient Medications Prior to Visit  Medication Sig Dispense Refill  . acyclovir ointment (ZOVIRAX) 5 % Apply 1 application topically every 3 (three) hours. 15 g 2  . alendronate (FOSAMAX) 70 MG tablet TAKE 1 TABLET BY MOUTH ONCE WEEKLY 4 tablet 11  . ASHWAGANDHA PO Take by mouth.    Marland Kitchen aspirin 81 MG tablet Take 81 mg by mouth daily.    . Benralizumab (FASENRA PEN) 30 MG/ML SOAJ Inject 30 mg into the skin every 8 (eight) weeks. 1 mL 5  . Benralizumab (FASENRA) 30 MG/ML SOSY Inject 1 mL (30 mg total) into the skin every 8 (eight) weeks. 1 mL 5  . Bioflavonoid Products (ESTER C PO) Take 500 mg by mouth daily.    . Calcium-Magnesium-Vitamin D 300-150-400 MG-MG-UNIT TABS Take 1 tablet by mouth daily.    . famotidine (PEPCID) 20 MG tablet Take 1 tablet (20 mg total) by mouth at bedtime. 30  tablet 6  . MAGNESIUM GLYCINATE PO Take 400 mg by mouth daily.    . mupirocin ointment (BACTROBAN) 2 % Place 1 application into the nose 2 (two) times daily. 22 g 1  . nitroGLYCERIN (NITROSTAT) 0.4 MG SL tablet PLACE 1 TABLET UNDER THE TONGUE EVERY 5 MINUTES AS NEEDED FOR CHEST PAIN. 25 tablet 4  . pantoprazole (PROTONIX) 40 MG tablet Take  1 tablet (40 mg total) by mouth daily. 90 tablet 3  . REPATHA SURECLICK 751 MG/ML SOAJ INJECT 1 PEN INTO THE SKIN EVERY 14 DAYS 6 mL 3  . tiZANidine (ZANAFLEX) 2 MG tablet Take 0.5-2 tablets (1-4 mg total) by mouth 2 (two) times daily as needed for muscle spasms. 30 tablet 1  . venlafaxine XR (EFFEXOR-XR) 150 MG 24 hr capsule Take 1 capsule (150 mg total) by mouth daily with breakfast. 90 capsule 1  . venlafaxine XR (EFFEXOR-XR) 75 MG 24 hr capsule Take 1 capsule (75 mg total) by mouth daily with breakfast. 30 capsule 1  . VITAMIN D PO Take 5,000 Units by mouth daily.    . Zinc 30 MG TABS Take 1 tablet by mouth daily.    Marland Kitchen LORazepam (ATIVAN) 1 MG tablet Take 0.5-1 tablets (0.5-1 mg total) by mouth 2 (two) times daily as needed for anxiety or sleep. 30 tablet 2  . benzonatate (TESSALON) 200 MG capsule Take 1 capsule (200 mg total) by mouth 3 (three) times daily as needed for cough. (Patient not taking: Reported on 06/30/2020) 30 capsule 1  . ipratropium-albuterol (DUONEB) 0.5-2.5 (3) MG/3ML SOLN Take 3 mLs by nebulization every 6 (six) hours as needed. (Patient not taking: Reported on 06/30/2020) 360 mL 5  . predniSONE (DELTASONE) 20 MG tablet Take 1 tablet (20 mg total) by mouth daily with breakfast. (Patient not taking: Reported on 06/30/2020) 5 tablet 0  . venlafaxine XR (EFFEXOR XR) 37.5 MG 24 hr capsule Take 1 capsule (37.5 mg total) by mouth daily with breakfast. (Patient not taking: Reported on 06/30/2020) 30 capsule 1   No facility-administered medications prior to visit.    Allergies  Allergen Reactions  . Beclomethasone Dipropionate Hives and Other (See  Comments)     weight gain  . Flexeril [Cyclobenzaprine] Anxiety  . Mometasone Furo-Formoterol Fum Hives and Other (See Comments)    weight gain  . Sulfonamide Derivatives Hives and Rash  . Statins     Myalgias, RLS    Review of Systems  Constitutional: Positive for malaise/fatigue. Negative for chills and fever.  HENT: Negative for congestion and hearing loss.   Eyes: Negative for discharge.  Respiratory: Negative for cough, sputum production and shortness of breath.   Cardiovascular: Negative for chest pain, palpitations and leg swelling.  Gastrointestinal: Negative for abdominal pain, blood in stool, constipation, diarrhea, heartburn, nausea and vomiting.  Genitourinary: Negative for dysuria, frequency, hematuria and urgency.  Musculoskeletal: Positive for back pain, joint pain, myalgias and neck pain. Negative for falls.  Skin: Negative for rash.  Neurological: Positive for headaches. Negative for dizziness, sensory change, loss of consciousness and weakness.  Endo/Heme/Allergies: Negative for environmental allergies. Does not bruise/bleed easily.  Psychiatric/Behavioral: Negative for depression and suicidal ideas. The patient is not nervous/anxious and does not have insomnia.        Objective:    Physical Exam Constitutional:      General: She is not in acute distress.    Appearance: She is not diaphoretic.  HENT:     Head: Normocephalic and atraumatic.     Right Ear: External ear normal.     Left Ear: External ear normal.     Nose: Nose normal.     Mouth/Throat:     Mouth: Oropharynx is clear and moist.     Pharynx: No oropharyngeal exudate.  Eyes:     General: No scleral icterus.       Right eye: No discharge.  Left eye: No discharge.     Conjunctiva/sclera: Conjunctivae normal.     Pupils: Pupils are equal, round, and reactive to light.  Neck:     Thyroid: No thyromegaly.  Cardiovascular:     Rate and Rhythm: Normal rate and regular rhythm.     Pulses:  Intact distal pulses.     Heart sounds: Normal heart sounds. No murmur heard.   Pulmonary:     Effort: Pulmonary effort is normal. No respiratory distress.     Breath sounds: Normal breath sounds. No wheezing or rales.  Abdominal:     General: Bowel sounds are normal. There is no distension.     Palpations: Abdomen is soft. There is no mass.     Tenderness: There is no abdominal tenderness.  Musculoskeletal:        General: No tenderness or edema. Normal range of motion.     Cervical back: Normal range of motion and neck supple.  Lymphadenopathy:     Cervical: No cervical adenopathy.  Skin:    General: Skin is warm and dry.     Findings: No rash.  Neurological:     Mental Status: She is alert and oriented to person, place, and time.     Cranial Nerves: No cranial nerve deficit.     Coordination: Coordination normal.     Deep Tendon Reflexes: Reflexes are normal and symmetric. Reflexes normal.     BP 122/80 (BP Location: Left Arm, Patient Position: Sitting, Cuff Size: Normal)   Pulse 81   Temp 98.3 F (36.8 C) (Oral)   Resp 15   Wt 155 lb 3.2 oz (70.4 kg)   SpO2 99%   BMI 28.62 kg/m  Wt Readings from Last 3 Encounters:  06/30/20 155 lb 3.2 oz (70.4 kg)  05/04/20 156 lb (70.8 kg)  04/07/20 156 lb (70.8 kg)    Diabetic Foot Exam - Simple   No data filed    Lab Results  Component Value Date   WBC 7.3 06/30/2020   HGB 12.9 06/30/2020   HCT 38.8 06/30/2020   PLT 304.0 06/30/2020   GLUCOSE 79 06/30/2020   CHOL 166 06/30/2020   TRIG 92.0 06/30/2020   HDL 62.80 06/30/2020   LDLDIRECT 177.8 09/07/2011   LDLCALC 85 06/30/2020   ALT 15 06/30/2020   AST 17 06/30/2020   NA 136 06/30/2020   K 4.7 06/30/2020   CL 101 06/30/2020   CREATININE 0.64 06/30/2020   BUN 14 06/30/2020   CO2 30 06/30/2020   TSH 2.12 06/30/2020   INR 1.0 08/21/2017   HGBA1C 5.5 06/30/2020    Lab Results  Component Value Date   TSH 2.12 06/30/2020   Lab Results  Component Value Date    WBC 7.3 06/30/2020   HGB 12.9 06/30/2020   HCT 38.8 06/30/2020   MCV 90.1 06/30/2020   PLT 304.0 06/30/2020   Lab Results  Component Value Date   NA 136 06/30/2020   K 4.7 06/30/2020   CO2 30 06/30/2020   GLUCOSE 79 06/30/2020   BUN 14 06/30/2020   CREATININE 0.64 06/30/2020   BILITOT 0.3 06/30/2020   ALKPHOS 72 06/30/2020   AST 17 06/30/2020   ALT 15 06/30/2020   PROT 6.5 06/30/2020   ALBUMIN 4.4 06/30/2020   CALCIUM 9.4 06/30/2020   ANIONGAP 6 08/24/2017   GFR 94.23 06/30/2020   Lab Results  Component Value Date   CHOL 166 06/30/2020   Lab Results  Component Value Date   HDL  62.80 06/30/2020   Lab Results  Component Value Date   LDLCALC 85 06/30/2020   Lab Results  Component Value Date   TRIG 92.0 06/30/2020   Lab Results  Component Value Date   CHOLHDL 3 06/30/2020   Lab Results  Component Value Date   HGBA1C 5.5 06/30/2020       Assessment & Plan:   Problem List Items Addressed This Visit    Hyperglycemia (Chronic)    hgba1c acceptable, minimize simple carbs. Increase exercise as tolerated.       Relevant Orders   Comprehensive metabolic panel (Completed)   TSH (Completed)   Hemoglobin A1c (Completed)   Intolerance, drug:  statins\cholesterol medications (Chronic)   Hyperlipidemia, mixed    Encouraged heart healthy diet, increase exercise, avoid trans fats, consider a krill oil cap daily      Relevant Orders   TSH (Completed)   Lipid panel (Completed)   Asthma   Osteoarthritis   Severe persistent asthma with intensive monitoring    Following with pulmonology and is doing better with current treatments.       Muscle spasm   Chronic pain    Patient is noting frustration with all of her pain becoming worse and more persistent. Back, neck pain, headaches, shoulder pain. She has tried chiropractic, physical therapy, dry needling and massage. She gets some relief temporarily but then pain returns. She will continue with PT and we will check  xrays. Encouraged moist heat and gentle stretching as tolerated. May try NSAIDs and prescription meds as directed and report if symptoms worsen or seek immediate care      Relevant Medications   gabapentin (NEURONTIN) 100 MG capsule   Low back pain   Relevant Orders   CBC with Differential/Platelet (Completed)   Osteoporosis    Encouraged to get adequate exercise, calcium and vitamin d intake. Tolerating Fosamax      Neck pain    Encouraged moist heat and gentle stretching as tolerated. May try NSAIDs and prescription meds as directed and report if symptoms worsen or seek immediate care      Headache    Encouraged increased hydration, 64 ounces of clear fluids daily. Minimize alcohol and caffeine. Eat small frequent meals with lean proteins and complex carbs. Avoid high and low blood sugars. Get adequate sleep, 7-8 hours a night. Needs exercise daily preferably in the morning. She has been following with neurology      Relevant Medications   gabapentin (NEURONTIN) 100 MG capsule   Other cervical disc degeneration, unspecified cervical region   Occipital pain   Relevant Medications   gabapentin (NEURONTIN) 100 MG capsule   Statin intolerance    She does not tolerate statins      Preventative health care - Primary    Patient encouraged to maintain heart healthy diet, regular exercise, adequate sleep. Consider daily probiotics. Take medications as prescribed. Labs ordered and reviewed.MM was August 2021, repeat annually, colonoscopy June 2021 no polyps repeat colonoscopy in 10 years.       Hemorrhoids    Maintain a high fiber diet and 60-80 ounces of water a day      LUQ discomfort    Check xrays and monitor. She will report if her pain worsens      Relevant Orders   Lipase (Completed)   Amylase (Completed)   Left shoulder pain    Check xrays and continue PT      Relevant Orders   DG Shoulder Left (Completed)  Other Visit Diagnoses    Urinary frequency        Relevant Orders   Urinalysis (Completed)   LUQ pain       Relevant Orders   DG Abd 2 Views (Completed)      I am having [name] start on chlorpheniramine-HYDROcodone and gabapentin. I am also having her maintain her aspirin, Calcium-Magnesium-Vitamin D, acyclovir ointment, Bioflavonoid Products (ESTER C PO), mupirocin ointment, VITAMIN D PO, MAGNESIUM GLYCINATE PO, ASHWAGANDHA PO, Repatha SureClick, pantoprazole, famotidine, venlafaxine XR, alendronate, tiZANidine, Zinc, nitroGLYCERIN, predniSONE, benzonatate, ipratropium-albuterol, Fasenra, Fasenra Pen, venlafaxine XR, and venlafaxine XR.  Meds ordered this encounter  Medications  . chlorpheniramine-HYDROcodone (TUSSIONEX PENNKINETIC ER) 10-8 MG/5ML SUER    Sig: Take 5 mLs by mouth every 12 (twelve) hours as needed for cough.    Dispense:  140 mL    Refill:  0  . gabapentin (NEURONTIN) 100 MG capsule    Sig: Take 1 capsule (100 mg total) by mouth 3 (three) times daily.    Dispense:  90 capsule    Refill:  2     [name], MD

## 2020-07-01 NOTE — Assessment & Plan Note (Addendum)
Encouraged to get adequate exercise, calcium and vitamin d intake. Tolerating Fosamax 

## 2020-07-01 NOTE — Assessment & Plan Note (Signed)
Maintain a high fiber diet and 60-80 ounces of water a day

## 2020-07-01 NOTE — Assessment & Plan Note (Signed)
Patient is noting frustration with all of her pain becoming worse and more persistent. Back, neck pain, headaches, shoulder pain. She has tried chiropractic, physical therapy, dry needling and massage. She gets some relief temporarily but then pain returns. She will continue with PT and we will check xrays. Encouraged moist heat and gentle stretching as tolerated. May try NSAIDs and prescription meds as directed and report if symptoms worsen or seek immediate care

## 2020-07-01 NOTE — Telephone Encounter (Signed)
Requesting: Ativan Contract: 07/30/14   UDS: 07/30/14   Last Visit: 06/30/2020 Next Visit: 09/01/2020 Last Refill: 10/24/2020

## 2020-07-02 DIAGNOSIS — M25512 Pain in left shoulder: Secondary | ICD-10-CM | POA: Diagnosis not present

## 2020-07-04 ENCOUNTER — Telehealth: Payer: Self-pay | Admitting: Nurse Practitioner

## 2020-07-04 NOTE — Telephone Encounter (Signed)
Patient caledl our on call service today.  Patient with history of diverticulitis, followed by Dr. Hilarie Fredrickson. She was seen by her PCP earlier this week for routine physical and the patient noted having "vertical left upper abdominal pain". An abdominal xray showed constipation. She stated she didn't really feel constipated, was passing normal brown formed stools daily. CBC and CMP 06/30/2020 were normal. No LLQ pain. She took Magnesium Citrate 2 oz yesterday which resulted in passing many loose to watery stools. She saw a few drops of bright red blood in the toilet water. Today she passed 5 to 6 soft to loose dark brown BMs with "light maroon" tinged blood with the last 3 to 4 BMs. No N/V. No fever. No LLQ pain. She continues to have mild pain 2 inches left of her belly button which radiates to the LUQ. She stated her BP is 137/80. She denies feeling weak or profoundly fatigued. No CP, palpitations, dizziness or SOB.  EGD 11/19/2019 showed: - Tortuous esophagus likely due to large hiatal hernia. - 7 cm hiatal hernia. - Low-grade of narrowing Schatzki ring. Dilated to 18 mm with balloon. - Normal stomach. - Duodenal diverticulum with normal duodenal mucosa. - No specimens collected.  Colonoscopy 11/19/2019:  - Moderate diverticulosis from cecum to sigmoid colon. Erythema was seen in association with the diverticular opening in the sigmoid colon. Biopsied to exclude segmental colitis (SCAD). - Internal hemorrhoids. - The examination was otherwise normal. Biopsies were negative, no colitis/IBD.   I advised the patient to go on a clear liquid diet for the rest of today. Take Pepcid 20mg  po QD next few days (no longer taking PPI of Pepcid). No more laxatives.  I discussed she may possible have ischemic colitis secondary to laxative use, possible diverticular bleed. She was advised to go to the ED if she has worsening abdominal pain or significant blood per the rectum. If needed, she agreed to go to [facility] which is her preferred ED. She will advance to soft diet tomorrow if no further diarrhea/hematochezia.  She will need follow up in our GI clinic next week. Patient is aware our office is closed on Monday 1/17 due to the inclement weather. She will call our on call GI service as needed.   Calista, pls contact patient on Tuesday for sx update and schedule a follow up appointment  with Dr. Hilarie Fredrickson or APP. thx

## 2020-07-07 NOTE — Telephone Encounter (Signed)
Pt states she is having left sided abd pain from the waist up, 2-3 inches left of the belly button, states this is not pain like she has with diverticulitis. Pt not sure if this is related to the back pain she has, she wants to discuss symptoms with Dr. Hilarie Fredrickson and try to have some diagnostic test ordered to make sure it will see her back and her abd. States she is getting depressed due to the chronic pain. She has not seen any blood in the stool since Saturday. Pt is scheduled to see Dr. Norman Herrlich 07/13/20 at 9:30am, pt aware of appt.

## 2020-07-07 NOTE — Telephone Encounter (Signed)
Agree with Colleen's recommendations Patient has an appointment next week If pain continues I would recommend we schedule a CT scan of the abdomen pelvis with contrast.

## 2020-07-09 ENCOUNTER — Encounter: Payer: Self-pay | Admitting: Family Medicine

## 2020-07-09 DIAGNOSIS — M542 Cervicalgia: Secondary | ICD-10-CM | POA: Diagnosis not present

## 2020-07-09 DIAGNOSIS — M545 Low back pain, unspecified: Secondary | ICD-10-CM | POA: Diagnosis not present

## 2020-07-10 ENCOUNTER — Telehealth: Payer: Self-pay | Admitting: Internal Medicine

## 2020-07-10 NOTE — Telephone Encounter (Signed)
Called [employer] to schedule Fasenra shipment.  I was told Patient assistance was needed for 2022. Patient is scheduled for self training 07/23/20. Sample available if needed.

## 2020-07-13 ENCOUNTER — Other Ambulatory Visit (INDEPENDENT_AMBULATORY_CARE_PROVIDER_SITE_OTHER): Payer: PPO

## 2020-07-13 ENCOUNTER — Ambulatory Visit: Payer: PPO | Admitting: Internal Medicine

## 2020-07-13 ENCOUNTER — Ambulatory Visit (INDEPENDENT_AMBULATORY_CARE_PROVIDER_SITE_OTHER)
Admission: RE | Admit: 2020-07-13 | Discharge: 2020-07-13 | Disposition: A | Discharge status: Home / Self Care | Payer: PPO | Source: Ambulatory Visit | Attending: Internal Medicine | Admitting: Internal Medicine

## 2020-07-13 ENCOUNTER — Encounter: Payer: Self-pay | Admitting: Internal Medicine

## 2020-07-13 ENCOUNTER — Other Ambulatory Visit: Payer: Self-pay

## 2020-07-13 VITALS — BP 106/72 | HR 72 | Ht 61.0 in | Wt 156.0 lb

## 2020-07-13 DIAGNOSIS — K921 Melena: Secondary | ICD-10-CM | POA: Diagnosis not present

## 2020-07-13 DIAGNOSIS — K219 Gastro-esophageal reflux disease without esophagitis: Secondary | ICD-10-CM | POA: Diagnosis not present

## 2020-07-13 DIAGNOSIS — J9811 Atelectasis: Secondary | ICD-10-CM

## 2020-07-13 DIAGNOSIS — K449 Diaphragmatic hernia without obstruction or gangrene: Secondary | ICD-10-CM | POA: Diagnosis not present

## 2020-07-13 DIAGNOSIS — Z8719 Personal history of other diseases of the digestive system: Secondary | ICD-10-CM

## 2020-07-13 DIAGNOSIS — R0602 Shortness of breath: Secondary | ICD-10-CM | POA: Diagnosis not present

## 2020-07-13 DIAGNOSIS — R109 Unspecified abdominal pain: Secondary | ICD-10-CM

## 2020-07-13 LAB — CBC WITH DIFFERENTIAL/PLATELET
Basophils Absolute: 0 10*3/uL (ref 0.0–0.1)
Basophils Relative: 0.4 % (ref 0.0–3.0)
Eosinophils Absolute: 0 10*3/uL (ref 0.0–0.7)
Eosinophils Relative: 0 % (ref 0.0–5.0)
HCT: 30.2 % — ABNORMAL LOW (ref 36.0–46.0)
Hemoglobin: 10.1 g/dL — ABNORMAL LOW (ref 12.0–15.0)
Lymphocytes Relative: 28.5 % (ref 12.0–46.0)
Lymphs Abs: 1.2 10*3/uL (ref 0.7–4.0)
MCHC: 33.5 g/dL (ref 30.0–36.0)
MCV: 89.2 fl (ref 78.0–100.0)
Monocytes Absolute: 0.3 10*3/uL (ref 0.1–1.0)
Monocytes Relative: 8.3 % (ref 3.0–12.0)
Neutro Abs: 2.6 10*3/uL (ref 1.4–7.7)
Neutrophils Relative %: 62.8 % (ref 43.0–77.0)
Platelets: 333 10*3/uL (ref 150.0–400.0)
RBC: 3.38 Mil/uL — ABNORMAL LOW (ref 3.87–5.11)
RDW: 13.4 % (ref 11.5–15.5)
WBC: 4.1 10*3/uL (ref 4.0–10.5)

## 2020-07-13 LAB — FERRITIN: Ferritin: 7.1 ng/mL — ABNORMAL LOW (ref 10.0–291.0)

## 2020-07-13 MED ORDER — FAMOTIDINE 20 MG PO TABS: 20.0000 mg | ORAL_TABLET | Freq: Two times a day (BID) | ORAL | 2 refills | Status: DC

## 2020-07-13 NOTE — Patient Instructions (Signed)
Your provider has requested that you go to the basement level for lab work before leaving today. Press "B" on the elevator. The lab is located at the first door on the left as you exit the elevator. _____________________________________________________  Your provider has requested that you have an chest x ray before leaving today. Please go to the basement floor to our Radiology department for the test. _____________________________________________________  You have been scheduled for a CT scan of the abdomen and pelvis at [facility] radiology.  You are scheduled on Friday 07/17/20 at 9:00 am. You should arrive 30 minutes prior to your appointment time for registration. Please follow the written instructions below on the day of your exam:  WARNING: IF YOU ARE ALLERGIC TO IODINE/X-RAY DYE, PLEASE NOTIFY RADIOLOGY IMMEDIATELY AT [phone]! YOU WILL BE GIVEN A 13 HOUR PREMEDICATION PREP.  1) Do not eat or drink anything after 5:00 am (4 hours prior to your test) 2) You have been given 2 bottles of oral contrast to drink. The solution may taste better if refrigerated, but do NOT add ice or any other liquid to this solution. Shake well before drinking.    Drink 1 bottle of contrast @ 7:00 am (2 hours prior to your exam)  Drink 1 bottle of contrast @ 8:00 am (1 hour prior to your exam)  You may take any medications as prescribed with a small amount of water, if necessary. If you take any of the following medications: METFORMIN, GLUCOPHAGE, GLUCOVANCE, AVANDAMET, RIOMET, FORTAMET, [place] MET, JANUMET, GLUMETZA or METAGLIP, you MAY be asked to HOLD this medication 48 hours AFTER the exam.  The purpose of you drinking the oral contrast is to aid in the visualization of your intestinal tract. The contrast solution may cause some diarrhea. Depending on your individual set of symptoms, you may also receive an intravenous injection of x-ray contrast/dye. Plan on being at [facility] for  30 minutes or longer, depending on the type of exam you are having performed.  This test typically takes 30-45 minutes to complete.  If you have any questions regarding your exam or if you need to reschedule, you may call the CT department at [phone] between the hours of 8:00 am and 5:00 pm, Monday-Friday.  __________________________________________________________  We have sent the following medications to your pharmacy for you to pick up at your convenience: Famotidine 20 mg twice daily (increase from previous dosing)  Remain off pantoprazole. __________________________________________________________  If you are age 77 or older, your body mass index should be between 23-30. Your Body mass index is 29.48 kg/m. If this is out of the aforementioned range listed, please consider follow up with your Primary Care Provider.  If you are age 27 or younger, your body mass index should be between 19-25. Your Body mass index is 29.48 kg/m. If this is out of the aformentioned range listed, please consider follow up with your Primary Care Provider.   _________________________________________________________ Due to recent changes in healthcare laws, you may see the results of your imaging and laboratory studies on MyChart before your provider has had a chance to review them.  We understand that in some cases there may be results that are confusing or concerning to you. Not all laboratory results come back in the same time frame and the provider may be waiting for multiple results in order to interpret others.  Please give [name] 48 hours in order for your provider to thoroughly review all the results before contacting the office for clarification  of your results.

## 2020-07-13 NOTE — Telephone Encounter (Signed)
Called patient to discuss, left message.  [place] [employer] [place] has an expedited process for Medicare re-enrollers:  1- Visit [email or web address] and navigate to the Medicare Reenrollment tab to complete the required attestation and consent.  2- Call and complete over the phone- [phone], Monday through Friday, 9:00 a.m. - 6:00 p.m. EST  * If the attestation is not completed by 07/20/2020 you will have to complete a new enrollment application.

## 2020-07-13 NOTE — Progress Notes (Signed)
Subjective:    Patient ID: Victoria [name], female    DOB: [birth year], 65 y.o.   MRN: [id number]  HPI Victoria [name] is a 65 yo female with PMH of GERD with HH, Schatzki's ring with dilation in 2016, diverticulosis with hx of diverticulitis, pulm aspergillosis with eosinophilic asthma and bronchiectasis who is here for followup.  She is here alone today and was last seen in April 2021.  She reports, as documented by Carl Best, NP during on call weekend, that she developed loose stools 3 which contain maroon blood on 07/04/2020.  This all occurred in the morning.  Several days prior to this occurring she had seen Dr. Charlett Blake during the visit she reported left-sided abdominal pain.  An x-ray of the abdomen was done which suggested moderate constipation.  The patient took a small dose of magnesium citrate and then later developed the loose stools with blood.  She did not have increase in her left-sided abdominal pain during this time.  After this occurrence she did not see any more hematochezia but did see stools that were darker than normal for a few days.  Initially after this episode stools were smaller pieces and in the last several days have come back closer to her normal.  Today she had a brown formed fairly normal sized stool.  She still has a rather constant left-sided abdominal pain which is linear from the left mid towards the left lower abdomen.  She wondered if this was related to her back pain or even mesh "pulling" from prior abdominal hernia repair.  She has been doing exercises and core strengthening to help with back pain.  She gets twinges of worsening left-sided pain at times during exercise.  Sometimes straining for bowel movement will also make the pain worse.  She is not having much reflux or heartburn symptom.  No dysphagia symptom.  1-2 times every 3 to 5 months she will feel that food never really makes it into her stomach and it will be building up leading to episodes of  vomiting.  Again this is quite rare and has not happened in the last several months.  She is not having nausea.  Since I begin pantoprazole in the morning and famotidine in the evening she has noticed stools becoming a little less frequent and more formed.  For this reason when she spoke to [facility] the pantoprazole was stopped and she has been using famotidine 20 mg in the evening.  No definite change in bowel character since stopping the pantoprazole.   Review of Systems As per HPI, otherwise negative  Current Medications, Allergies, Past Medical History, Past Surgical History, Family History and Social History were reviewed in [employer] record.     Objective:   Physical Exam BP 106/72   Pulse 72   Ht 5\' 1"  (1.549 m)   Wt 156 lb (70.8 kg)   BMI 29.48 kg/m  Gen: awake, alert, NAD HEENT: anicteric, conj pale CV: RRR, no mrg Pulm: CTA b/l Abd: soft, NT/ND, +BS throughout Ext: no c/c/e Neuro: nonfocal  CBC    Component Value Date/Time   WBC 7.3 06/30/2020 1420   RBC 4.30 06/30/2020 1420   HGB 12.9 06/30/2020 1420   HGB 12.6 08/21/2017 1412   HCT 38.8 06/30/2020 1420   HCT 38.1 08/21/2017 1412   PLT 304.0 06/30/2020 1420   PLT 316 08/21/2017 1412   MCV 90.1 06/30/2020 1420   MCV 88 08/21/2017 1412   MCH 29.3  08/24/2017 0349   MCHC 33.2 06/30/2020 1420   RDW 13.8 06/30/2020 1420   RDW 14.1 08/21/2017 1412   LYMPHSABS 1.4 06/30/2020 1420   MONOABS 0.5 06/30/2020 1420   EOSABS 0.0 06/30/2020 1420   BASOSABS 0.1 06/30/2020 1420   CMP     Component Value Date/Time   NA 136 06/30/2020 1420   NA 143 08/21/2017 1412   K 4.7 06/30/2020 1420   CL 101 06/30/2020 1420   CO2 30 06/30/2020 1420   GLUCOSE 79 06/30/2020 1420   BUN 14 06/30/2020 1420   BUN 13 08/21/2017 1412   CREATININE 0.64 06/30/2020 1420   CALCIUM 9.4 06/30/2020 1420   PROT 6.5 06/30/2020 1420   ALBUMIN 4.4 06/30/2020 1420   AST 17 06/30/2020 1420   ALT 15 06/30/2020 1420    ALKPHOS 72 06/30/2020 1420   BILITOT 0.3 06/30/2020 1420   GFRNONAA >60 08/24/2017 0349   GFRAA >60 08/24/2017 0349         Assessment & Plan:  65 yo female with PMH of GERD with HH, Schatzki's ring with dilation in 2016, diverticulosis with hx of diverticulitis, pulm aspergillosis with eosinophilic asthma and bronchiectasis who is here for followup.  1.  Left-sided abdominal pain/isolated hematochezia --her episode of bleeding occurred after laxative use which makes a mild ischemic colitis event most likely.  Other possibilities include diverticular hemorrhage, segmental colitis associated with diverticulosis, with the former being the least likely.  I did see erythema in the left colon at colonoscopy last year the biopsies were not consistent at that time with segmental colitis.  Given her ongoing left-sided abdominal pain I am suspicious for segmental colitis but will reserve treatment until I obtain cross-sectional imaging.  Proceed as follows:  --Repeat CBC today after recent bleeding; also check a ferritin --CT scan abdomen pelvis with contrast; evaluate for colitis particularly on the left --Consider mesalamine and MiraLAX therapy depending on CT results --Reserve repeat colonoscopy for refractory symptoms or recurrent bleeding  2.  GERD with large hiatal hernia --she had an upper endoscopy last year which was reviewed.  She is not having much reflux symptoms but is only been off PPI for a short time.  PPI was for GERD but also gastric protection given the large hiatal hernia.  Given possible bowel change related to pantoprazole I will leave her off and increase famotidine to twice daily --Famotidine 20 mg twice daily --Remain off pantoprazole for now  3. HH repair --we discussed hiatal hernia repair.  Pulmonary also wondered if this would help lung disease.  Her lung disease is "the best it has been" of late and given that elective surgeries are on hold I do not think now is the time  for [facility] repair.  We can discuss this more in the future depending on symptoms.  4.  Atelectasis left lung --seen on abdominal x-ray.  Dr. Charlett Blake had ordered 2 view chest x-ray but given the weather she is not return for this test.  We will do it here today --PA and lateral chest x-ray  40 minutes total spent today including patient facing time, coordination of care, reviewing medical history/procedures/pertinent radiology studies, and documentation of the encounter.

## 2020-07-14 ENCOUNTER — Ambulatory Visit: Payer: PPO | Admitting: Nurse Practitioner

## 2020-07-14 ENCOUNTER — Other Ambulatory Visit: Payer: Self-pay

## 2020-07-14 DIAGNOSIS — D509 Iron deficiency anemia, unspecified: Secondary | ICD-10-CM

## 2020-07-17 ENCOUNTER — Ambulatory Visit (HOSPITAL_BASED_OUTPATIENT_CLINIC_OR_DEPARTMENT_OTHER)
Admission: RE | Admit: 2020-07-17 | Discharge: 2020-07-17 | Disposition: A | Discharge status: Home / Self Care | Payer: PPO | Source: Ambulatory Visit | Attending: Internal Medicine | Admitting: Internal Medicine

## 2020-07-17 ENCOUNTER — Other Ambulatory Visit: Payer: Self-pay

## 2020-07-17 DIAGNOSIS — K921 Melena: Secondary | ICD-10-CM | POA: Insufficient documentation

## 2020-07-17 DIAGNOSIS — R109 Unspecified abdominal pain: Secondary | ICD-10-CM | POA: Diagnosis not present

## 2020-07-17 DIAGNOSIS — K5730 Diverticulosis of large intestine without perforation or abscess without bleeding: Secondary | ICD-10-CM | POA: Diagnosis not present

## 2020-07-17 MED ORDER — IOHEXOL 300 MG/ML  SOLN
100.0000 mL | Freq: Once | INTRAMUSCULAR | Status: AC | PRN
  Administered 2020-07-17: 100 mL via INTRAVENOUS

## 2020-07-20 ENCOUNTER — Other Ambulatory Visit: Payer: Self-pay

## 2020-07-20 ENCOUNTER — Telehealth: Payer: Self-pay | Admitting: Internal Medicine

## 2020-07-20 MED ORDER — METRONIDAZOLE 250 MG PO TABS: 250.0000 mg | ORAL_TABLET | Freq: Three times a day (TID) | ORAL | 0 refills | Status: DC

## 2020-07-20 MED ORDER — CIPROFLOXACIN HCL 500 MG PO TABS: 500.0000 mg | ORAL_TABLET | Freq: Two times a day (BID) | ORAL | 0 refills | Status: DC

## 2020-07-20 NOTE — Telephone Encounter (Signed)
Called and spoke with Patient. Patient stated she dropped off assistance paperwork, but was afraid she would have to cancel Fasenra injection.  Advised Patient sample is available. Injection appointment not cancelled .  Nothing further at this time.

## 2020-07-20 NOTE — Telephone Encounter (Signed)
Please see result note for CT scan Please let pt know that CT does not measure bone density Thus I cannot, nor can the radiologist, about bone density Bone density would have to be reassessed by bone density scanner which will be ordered via primary care at the appropriate interval after bisphosphonate therapy Thanks JMP

## 2020-07-22 ENCOUNTER — Other Ambulatory Visit: Payer: Self-pay

## 2020-07-22 MED ORDER — AMOXICILLIN-POT CLAVULANATE 875-125 MG PO TABS: 1.0000 | ORAL_TABLET | Freq: Two times a day (BID) | ORAL | 0 refills | Status: DC

## 2020-07-22 NOTE — Telephone Encounter (Signed)
Yes,  Can change to Augmentin 875 mg BID x 7 day (do not use the cipro/flagyl)

## 2020-07-23 ENCOUNTER — Ambulatory Visit (INDEPENDENT_AMBULATORY_CARE_PROVIDER_SITE_OTHER): Payer: PPO

## 2020-07-23 ENCOUNTER — Other Ambulatory Visit: Payer: Self-pay

## 2020-07-23 DIAGNOSIS — M542 Cervicalgia: Secondary | ICD-10-CM | POA: Diagnosis not present

## 2020-07-23 DIAGNOSIS — J4550 Severe persistent asthma, uncomplicated: Secondary | ICD-10-CM

## 2020-07-23 DIAGNOSIS — M545 Low back pain, unspecified: Secondary | ICD-10-CM | POA: Diagnosis not present

## 2020-07-23 MED ORDER — BENRALIZUMAB 30 MG/ML ~~LOC~~ SOSY
30.0000 mg | PREFILLED_SYRINGE | Freq: Once | SUBCUTANEOUS | Status: AC
  Administered 2020-07-23: 30 mg via SUBCUTANEOUS

## 2020-07-23 NOTE — Progress Notes (Signed)
Have you been hospitalized within the last 10 days?  No Do you have a fever?  No Do you have a cough?  No Do you have a headache or sore throat? No Do you have your Epi Pen visible and is it within date?  Yes   Patient trained for Fasenra pen self injections, with demo pen. Patient administered Fasenra pen with no problems. Self administration sites and specialty pharmacy information given.

## 2020-07-30 DIAGNOSIS — M545 Low back pain, unspecified: Secondary | ICD-10-CM | POA: Diagnosis not present

## 2020-07-30 DIAGNOSIS — M542 Cervicalgia: Secondary | ICD-10-CM | POA: Diagnosis not present

## 2020-08-04 NOTE — Telephone Encounter (Signed)
Submitted Patient Assistance Application to [employer] for [facility] along with provider portion. Will update patient when we receive a response.  Fax# [phone] Phone# [phone]

## 2020-08-06 DIAGNOSIS — M545 Low back pain, unspecified: Secondary | ICD-10-CM | POA: Diagnosis not present

## 2020-08-06 DIAGNOSIS — M542 Cervicalgia: Secondary | ICD-10-CM | POA: Diagnosis not present

## 2020-08-11 NOTE — Telephone Encounter (Signed)
Received notification from [employer] regarding a prior authorization for [facility]. Authorization has been APPROVED from 08/11/20 to 06/19/21.   Authorization # Phone # [phone]

## 2020-08-13 ENCOUNTER — Telehealth: Payer: Self-pay | Admitting: Internal Medicine

## 2020-08-13 NOTE — Telephone Encounter (Signed)
Fasenra Shipment Received:  30mg  #1 prefilled syringe Medication arrival date: 08/13/20 Lot #: [id number] Exp date: 07/20/2021 Received by: Elliot Dally

## 2020-08-14 NOTE — Telephone Encounter (Signed)
Called and spoke with Patient. Patient Victoria [name] was delivered to office, instead of Patient home address.  Patient coming by office to pick up Fasenra injection next week.

## 2020-08-18 DIAGNOSIS — M545 Low back pain, unspecified: Secondary | ICD-10-CM | POA: Diagnosis not present

## 2020-08-18 DIAGNOSIS — M542 Cervicalgia: Secondary | ICD-10-CM | POA: Diagnosis not present

## 2020-08-25 ENCOUNTER — Telehealth: Payer: Self-pay

## 2020-08-25 DIAGNOSIS — M542 Cervicalgia: Secondary | ICD-10-CM | POA: Diagnosis not present

## 2020-08-25 DIAGNOSIS — M545 Low back pain, unspecified: Secondary | ICD-10-CM | POA: Diagnosis not present

## 2020-08-25 NOTE — Telephone Encounter (Signed)
Left message for patient to please come into our lab Mon. - Fri. Bwt. 7:30am - 4:30pm for labs. Asked her to call us back at [phone] if any questions about this

## 2020-08-25 NOTE — Telephone Encounter (Signed)
-----   Message from Algernon Huxley, RN sent at 07/14/2020 12:28 PM EST ----- Regarding: labs Pt needs labs, orders in epic.

## 2020-09-01 ENCOUNTER — Encounter: Payer: Self-pay | Admitting: Family Medicine

## 2020-09-01 ENCOUNTER — Ambulatory Visit (INDEPENDENT_AMBULATORY_CARE_PROVIDER_SITE_OTHER): Payer: PPO | Admitting: Family Medicine

## 2020-09-01 ENCOUNTER — Other Ambulatory Visit: Payer: Self-pay

## 2020-09-01 VITALS — BP 106/64 | HR 81 | Temp 98.1°F | Resp 16 | Wt 156.6 lb

## 2020-09-01 DIAGNOSIS — R109 Unspecified abdominal pain: Secondary | ICD-10-CM

## 2020-09-01 DIAGNOSIS — D649 Anemia, unspecified: Secondary | ICD-10-CM

## 2020-09-01 DIAGNOSIS — Z789 Other specified health status: Secondary | ICD-10-CM | POA: Diagnosis not present

## 2020-09-01 NOTE — Assessment & Plan Note (Addendum)
On left side, had some blood in stool in January when her pain was worse. Now it is better, bowels re moving more. No further blood seen, recheck labs today and follow up with PT

## 2020-09-01 NOTE — Patient Instructions (Signed)
Back Pain, Adult  back pain is sudden and usually short-lived. It is often caused by an injury to the muscles and tissues in the back. The injury may result from:  A muscle or ligament getting overstretched or torn (strained). Ligaments are tissues that connect bones to each other. Lifting something improperly can cause a back strain.  Wear and tear (degeneration) of the spinal disks. Spinal disks are circular tissue that provide cushioning between the bones of the spine (vertebrae).  Twisting motions, such as while playing sports or doing yard work.  A hit to the back.  Arthritis. You may have a physical exam, lab tests, and imaging tests to find the cause of your pain. Acute back pain usually goes away with rest and home care. Follow these instructions at home: Managing pain, stiffness, and swelling  Treatment may include medicines for pain and inflammation that are taken by mouth or applied to the skin, prescription pain medicine, or muscle relaxants. Take over-the-counter and prescription medicines only as told by your health care provider.  Your health care provider may recommend applying ice during the first 24-48 hours after your pain starts. To do this: ? Put ice in a plastic bag. ? Place a towel between your skin and the bag. ? Leave the ice on for 20 minutes, 2-3 times a day.  If directed, apply heat to the affected area as often as told by your health care provider. Use the heat source that your health care provider recommends, such as a moist heat pack or a heating pad. ? Place a towel between your skin and the heat source. ? Leave the heat on for 20-30 minutes. ? Remove the heat if your skin turns bright red. This is especially important if you are unable to feel pain, heat, or cold. You have a greater risk of getting burned. Activity  Do not stay in bed. Staying in bed for more than 1-2 days can delay your recovery.  Sit up and stand up straight. Avoid leaning forward  when you sit or hunching over when you stand. ? If you work at a desk, sit close to it so you do not need to lean over. Keep your chin tucked in. Keep your neck drawn back, and keep your elbows bent at a 90-degree angle (right angle). ? Sit high and close to the steering wheel when you drive. Add lower back (lumbar) support to your car seat, if needed.  Take short walks on even surfaces as soon as you are able. Try to increase the length of time you walk each day.  Do not sit, drive, or stand in one place for more than 30 minutes at a time. Sitting or standing for long periods of time can put stress on your back.  Do not drive or use heavy machinery while taking prescription pain medicine.  Use proper lifting techniques. When you bend and lift, use positions that put less stress on your back: ? [place] your knees. ? Keep the load close to your body. ? Avoid twisting.  Exercise regularly as told by your health care provider. Exercising helps your back heal faster and helps prevent back injuries by keeping muscles strong and flexible.  Work with a physical therapist to make a safe exercise program, as recommended by your health care provider. Do any exercises as told by your physical therapist.   Lifestyle  Maintain a healthy weight. Extra weight puts stress on your back and makes it difficult to have good  posture.  Avoid activities or situations that make you feel anxious or stressed. Stress and anxiety increase muscle tension and can make back pain worse. Learn ways to manage anxiety and stress, such as through exercise. General instructions  Sleep on a firm mattress in a comfortable position. Try lying on your side with your knees slightly bent. If you lie on your back, put a pillow under your knees.  Follow your treatment plan as told by your health care provider. This may include: ? Cognitive or behavioral therapy. ? Acupuncture or massage therapy. ? Meditation or yoga. Contact a  health care provider if:  You have pain that is not relieved with rest or medicine.  You have increasing pain going down into your legs or buttocks.  Your pain does not improve after 2 weeks.  You have pain at night.  You lose weight without trying.  You have a fever or chills. Get help right away if:  You develop new bowel or bladder control problems.  You have unusual weakness or numbness in your arms or legs.  You develop nausea or vomiting.  You develop abdominal pain.  You feel faint. Summary  Acute back pain is sudden and usually short-lived.  Use proper lifting techniques. When you bend and lift, use positions that put less stress on your back.  Take over-the-counter and prescription medicines and apply heat or ice as directed by your health care provider. This information is not intended to replace advice given to you by your health care provider. Make sure you discuss any questions you have with your health care provider. Document Revised: 02/28/2020 Document Reviewed: 02/28/2020 Elsevier Patient Education  2021 [employer].

## 2020-09-01 NOTE — Assessment & Plan Note (Signed)
Does not tolerate

## 2020-09-01 NOTE — Progress Notes (Signed)
Patient ID: Victoria [name], female    DOB: [birth year]  Age: 65 y.o. MRN: [id number]    Subjective:  Subjective  HPI Victoria [name] presents for office visit today. She reports that after her visit on 07/01/2020 she experienced explosive bleeding. She complained of left side abdominal that day. She describes her stool as a maroon color. She notes that these symptoms have resolved within a day or two and they have not returned. She was made changes to her diet to manage her symptoms. The patient has a PMHx of Diverticulosis. She has been struggling with back pain and has been working with physical therapy and that is helping. No trauma or fall. Denies CP/palp/SOB/HA/congestion/fevers/GI or GU c/o. Taking meds as prescribed  She endorses having low back pain, however she relates this to participates in PT sessions. She denies that her back pain as worsened  After being in PT. She takes Gabapentin and Advil to tx. She reports not having pain in the last 2 days after taking Gabapentin. She notes getting a 1/2 inch left in her right heel. She denies any chest pain, SOB, fever, abdominal pain, cough, chills, sore throat, dysuria, urinary incontinence, HA, or N/VD. She request for a new cardiologist because the one she currently has is moving out [place].    Review of Systems  Constitutional: Negative for chills, fatigue and fever.  HENT: Negative for congestion, ear pain, rhinorrhea, sinus pressure and sore throat.   Respiratory: Negative for cough and shortness of breath.   Cardiovascular: Negative for chest pain, palpitations and leg swelling.  Gastrointestinal: Negative for blood in stool, constipation, diarrhea, nausea and vomiting.  Genitourinary: Negative for flank pain, frequency, vaginal bleeding, vaginal discharge and vaginal pain.  Musculoskeletal: Positive for back pain. Negative for myalgias.  Skin: Negative for rash.  Neurological: Negative for dizziness and headaches.    History Past Medical  History:  Diagnosis Date  . 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 Anemia    iron deficiency, resolved  . Anxiety   . Asthma   . CAD (coronary artery disease)    a. LHC 6/16:  oOM1 60, pRCA 25 >> med Rx b. cath 3/19 2nd OM with 95% stenosis s/p synergy DES & anomalous RCA  . CAP (community acquired pneumonia) 2016; 06/07/2016  . Chronic bronchitis ([place])   . Chronic lower back pain   . Complication of anesthesia    "think I have a hard time waking up from it"  . COPD (chronic obstructive pulmonary disease) ([place])   . Depression    mild  . Diverticulitis   . Diverticulosis   . GERD (gastroesophageal reflux disease)   . H/O hiatal hernia   . Headache    "weekly" (08/23/2017)  . History of echocardiogram    Echo 6/16:  Mod LVH, EF 60-65%, no RWMA, Gr 1 DD, trivial MR, normal LA size.  Marland Kitchen Hyperglycemia 11/20/2015  . Hyperlipidemia, mixed 09/11/2007   Qualifier: Diagnosis of  By: Jerold Coombe   Did not tolerate Lipitor, zocor, Lovastatin, Pravastatin, Livalo, Crestor even low dose   . IBS (irritable bowel syndrome)   . Maxillary sinusitis   . Normal cardiac stress test 11/2011   No evidence of ischemia or infarct. Calculated ejection fraction 72%.  . Obesity   . OSA (obstructive sleep apnea) 02/2012   has stopped using  cpap  . Osteoarthritis   . Osteoporosis   . Pneumonia 11/2011   "before  2013 I hadn't had pneumonia since I was a child" (04/13/2012)  . Pulmonary nodules   . S/P angioplasty with stent 08/23/17 ostial 2nd OM with DES synnergy 08/24/2017  . Schatzki's ring     She has a past surgical history that includes Cesarean section (1985); Ventral hernia repair (04/13/2012); Appendectomy (1989); Hernia repair (04/13/2012); Cardiac catheterization (N/A, 11/25/2014); Coronary angioplasty with stent (08/23/2017); LEFT HEART CATH AND CORONARY ANGIOGRAPHY (N/A, 08/23/2017); and CORONARY STENT INTERVENTION (N/A, 08/23/2017).   Her  family history includes Breast cancer in her mother and sister; Cancer in her mother and sister; Diabetes in her mother; Diverticulosis in her father; Heart attack in her maternal grandfather; Hypertension in her mother; Prostate cancer in her father; Pulmonary embolism in her brother.She reports that she has never smoked. She has never used smokeless tobacco. She reports current alcohol use. She reports that she does not use drugs.  Current Outpatient Medications on File Prior to Visit  Medication Sig Dispense Refill  . acyclovir ointment (ZOVIRAX) 5 % Apply 1 application topically every 3 (three) hours. 15 g 2  . alendronate (FOSAMAX) 70 MG tablet TAKE 1 TABLET BY MOUTH ONCE WEEKLY 4 tablet 11  . aspirin 81 MG tablet Take 81 mg by mouth daily.    . Benralizumab (FASENRA PEN) 30 MG/ML SOAJ Inject 30 mg into the skin every 8 (eight) weeks. 1 mL 5  . Bioflavonoid Products (ESTER C PO) Take 500 mg by mouth daily.    . Calcium-Magnesium-Vitamin D 300-150-400 MG-MG-UNIT TABS Take 1 tablet by mouth daily.    . chlorpheniramine-HYDROcodone (TUSSIONEX PENNKINETIC ER) 10-8 MG/5ML SUER Take 5 mLs by mouth every 12 (twelve) hours as needed for cough. 140 mL 0  . famotidine (PEPCID) 20 MG tablet Take 1 tablet (20 mg total) by mouth 2 (two) times daily. 60 tablet 2  . gabapentin (NEURONTIN) 100 MG capsule Take 1 capsule (100 mg total) by mouth 3 (three) times daily. 90 capsule 2  . LORazepam (ATIVAN) 1 MG tablet TAKE 1/2 TO 1 TABLET BY MOUTH 2 TIMES A DAY AS NEEDED FOR ANXIETY OR SLEEP 30 tablet 2  . MAGNESIUM GLYCINATE PO Take 400 mg by mouth daily.    . nitroGLYCERIN (NITROSTAT) 0.4 MG SL tablet PLACE 1 TABLET UNDER THE TONGUE EVERY 5 MINUTES AS NEEDED FOR CHEST PAIN. 25 tablet 4  . REPATHA SURECLICK 875 MG/ML SOAJ INJECT 1 PEN INTO THE SKIN EVERY 14 DAYS 6 mL 3  . tiZANidine (ZANAFLEX) 2 MG tablet Take 0.5-2 tablets (1-4 mg total) by mouth 2 (two) times daily as needed for muscle spasms. 30 tablet 1  .  venlafaxine XR (EFFEXOR-XR) 150 MG 24 hr capsule Take 1 capsule (150 mg total) by mouth daily with breakfast. 90 capsule 1  . VITAMIN D PO Take 5,000 Units by mouth daily.    . Zinc 30 MG TABS Take 1 tablet by mouth daily.     No current facility-administered medications on file prior to visit.     Objective:  Objective  Physical Exam Constitutional:      General: She is not in acute distress.    Appearance: Normal appearance. She is well-developed. She is not ill-appearing.  HENT:     Head: Normocephalic and atraumatic.     Right Ear: External ear normal.     Left Ear: External ear normal.     Nose: Nose normal.  Eyes:     General:        Right eye: No discharge.  Left eye: No discharge.     Extraocular Movements: Extraocular movements intact.     Pupils: Pupils are equal, round, and reactive to light.  Cardiovascular:     Rate and Rhythm: Normal rate and regular rhythm.     Pulses: Normal pulses.     Heart sounds: Normal heart sounds. No murmur heard.   Pulmonary:     Effort: Pulmonary effort is normal. No respiratory distress.     Breath sounds: Normal breath sounds. No wheezing or rales.  Abdominal:     General: Bowel sounds are normal.     Palpations: Abdomen is soft. There is no hepatomegaly, splenomegaly or mass.     Tenderness: There is no abdominal tenderness.  Musculoskeletal:        General: Normal range of motion.     Cervical back: Normal range of motion and neck supple.  Skin:    General: Skin is warm and dry.  Neurological:     Mental Status: She is alert and oriented to person, place, and time.  Psychiatric:        Behavior: Behavior normal.    BP 106/64   Pulse 81   Temp 98.1 F (36.7 C)   Resp 16   Wt 156 lb 9.6 oz (71 kg)   SpO2 99%   BMI 29.59 kg/m  Wt Readings from Last 3 Encounters:  09/01/20 156 lb 9.6 oz (71 kg)  07/13/20 156 lb (70.8 kg)  06/30/20 155 lb 3.2 oz (70.4 kg)     Lab Results  Component Value Date   WBC 5.6  09/01/2020   HGB 12.3 09/01/2020   HCT 36.8 09/01/2020   PLT 305.0 09/01/2020   GLUCOSE 79 06/30/2020   CHOL 166 06/30/2020   TRIG 92.0 06/30/2020   HDL 62.80 06/30/2020   LDLDIRECT 177.8 09/07/2011   LDLCALC 85 06/30/2020   ALT 15 06/30/2020   AST 17 06/30/2020   NA 136 06/30/2020   K 4.7 06/30/2020   CL 101 06/30/2020   CREATININE 0.64 06/30/2020   BUN 14 06/30/2020   CO2 30 06/30/2020   TSH 2.12 06/30/2020   INR 1.0 08/21/2017   HGBA1C 5.5 06/30/2020    CT Abdomen Pelvis W Contrast  Result Date: 07/17/2020 CLINICAL DATA:  Left-sided abdominal pain for 1 month, hematochezia. Evaluate for colitis. EXAM: CT ABDOMEN AND PELVIS WITH CONTRAST TECHNIQUE: Multidetector CT imaging of the abdomen and pelvis was performed using the standard protocol following bolus administration of intravenous contrast. CONTRAST:  150mL OMNIPAQUE IOHEXOL 300 MG/ML  SOLN COMPARISON:  CT abdomen and pelvis February 28, 2011. FINDINGS: Lower chest: Left Bochdalek hernia again noted. Large hiatal hernia. Hepatobiliary: Hepatic cysts. No suspicious hepatic lesion. Gallbladder is unremarkable. No biliary ductal dilatation. Pancreas: Unremarkable Spleen: Unremarkable Adrenals/Urinary Tract: Bilateral adrenal glands appear within normal limits. No hydronephrosis. No suspicious renal masses. The bladder is decompressed but appears unremarkable. Stomach/Bowel: Large hiatal hernia otherwise the stomach is decompressed limiting evaluation. Normal positioning of the duodenum and ligament of Treitz. No evidence of small-bowel obstruction. The appendix is not definitely visualized however there is no inflammatory stranding in the right lower quadrant to suggest acute inflammation. There is diffuse colonic diverticulosis. There is short segment wall thickening involving the sigmoid colon (series 2, image 61), likely accentuated by under distension. Vascular/Lymphatic: Aortic atherosclerosis. No enlarged abdominal or pelvic lymph  nodes. Reproductive: Uterus and bilateral adnexa are unremarkable. Other: Small fat containing bilateral inguinal hernia. Anterior abdominal wall hernia repair tacks. Musculoskeletal: Significant levoconvex  lumbar scoliosis with advanced discogenic and facet disease. Unchanged dilated sacral nerve root sheaths. Fatty atrophy of the paraspinal musculature. IMPRESSION: 1. Diffuse colonic diverticulosis with short segment wall thickening involving the sigmoid colon, likely accentuated by under distension. However, given the presence of associated presence of diffuse colonic diverticulosis and patient's symptoms, mild colitis/diverticulitis is possible. Additionally, recommend further evaluation with colonoscopy upon completion of therapy if not recently performed. 2. Large hiatal hernia. 3. Aortic atherosclerosis. Aortic Atherosclerosis (ICD10-I70.0). Electronically Signed   By: Dahlia Bailiff MD   On: 07/17/2020 09:51     Assessment & Plan:  Plan    No orders of the defined types were placed in this encounter.   Problem List Items Addressed This Visit    Abdominal pain    On left side, had some blood in stool in January when her pain was worse. Now it is better, bowels re moving more. No further blood seen, recheck labs today and follow up with PT      Statin intolerance    Does not tolerate       Other Visit Diagnoses    Anemia, unspecified type    -  Primary   Relevant Orders   CBC w/Diff (Completed)   Iron, TIBC and Ferritin Panel (Completed)   Ferritin (Completed)      Follow-up: Return in about 6 months (around 03/04/2021), or f/u in 6 months and CPE second of january 2023.   I,Alexis Bryant,acting as a Education administrator for [name], MD.,have documented all relevant documentation on the behalf of [name], MD,as directed by  [name], MD while in the presence of [name], MD. Medical screening examination/treatment was performed by qualified clinical staff member and as  supervising physician I was immediately available for consultation/collaboration. I have reviewed documentation and agree with assessment and plan.  [name], MD

## 2020-09-02 ENCOUNTER — Encounter: Payer: Self-pay | Admitting: Family Medicine

## 2020-09-02 LAB — CBC WITH DIFFERENTIAL/PLATELET
Basophils Absolute: 0 10*3/uL (ref 0.0–0.1)
Basophils Relative: 0.7 % (ref 0.0–3.0)
Eosinophils Absolute: 0 10*3/uL (ref 0.0–0.7)
Eosinophils Relative: 0 % (ref 0.0–5.0)
HCT: 36.8 % (ref 36.0–46.0)
Hemoglobin: 12.3 g/dL (ref 12.0–15.0)
Lymphocytes Relative: 26.7 % (ref 12.0–46.0)
Lymphs Abs: 1.5 10*3/uL (ref 0.7–4.0)
MCHC: 33.3 g/dL (ref 30.0–36.0)
MCV: 88.7 fl (ref 78.0–100.0)
Monocytes Absolute: 0.4 10*3/uL (ref 0.1–1.0)
Monocytes Relative: 7.8 % (ref 3.0–12.0)
Neutro Abs: 3.6 10*3/uL (ref 1.4–7.7)
Neutrophils Relative %: 64.8 % (ref 43.0–77.0)
Platelets: 305 10*3/uL (ref 150.0–400.0)
RBC: 4.15 Mil/uL (ref 3.87–5.11)
RDW: 14.1 % (ref 11.5–15.5)
WBC: 5.6 10*3/uL (ref 4.0–10.5)

## 2020-09-02 LAB — IRON,TIBC AND FERRITIN PANEL
%SAT: 14 % (calc) — ABNORMAL LOW (ref 16–45)
Ferritin: 8 ng/mL — ABNORMAL LOW (ref 16–288)
Iron: 49 ug/dL (ref 45–160)
TIBC: 362 mcg/dL (calc) (ref 250–450)

## 2020-09-02 LAB — FERRITIN: Ferritin: 7.7 ng/mL — ABNORMAL LOW (ref 10.0–291.0)

## 2020-09-03 ENCOUNTER — Other Ambulatory Visit: Payer: Self-pay | Admitting: Family Medicine

## 2020-09-03 DIAGNOSIS — D649 Anemia, unspecified: Secondary | ICD-10-CM

## 2020-09-03 NOTE — Progress Notes (Signed)
Left message for patient to return call to office. 

## 2020-09-07 ENCOUNTER — Telehealth: Payer: Self-pay | Admitting: Hematology and Oncology

## 2020-09-07 NOTE — Telephone Encounter (Signed)
Received a new hem referral from Dr. Charlett Blake for anemia. Victoria [name] has been cld and scheduled to see Dr. Chryl Heck on 4/4 at 11:20am. Pt aware to arrive 20 minuets early. Pt needed an appt in the first part of April because she was going out of town.

## 2020-09-08 ENCOUNTER — Ambulatory Visit: Payer: PPO | Admitting: Cardiology

## 2020-09-16 ENCOUNTER — Encounter: Payer: Self-pay | Admitting: Cardiology

## 2020-09-18 ENCOUNTER — Other Ambulatory Visit: Payer: Self-pay | Admitting: Internal Medicine

## 2020-09-18 DIAGNOSIS — Z8719 Personal history of other diseases of the digestive system: Secondary | ICD-10-CM

## 2020-09-18 DIAGNOSIS — K219 Gastro-esophageal reflux disease without esophagitis: Secondary | ICD-10-CM

## 2020-09-21 ENCOUNTER — Telehealth: Payer: Self-pay | Admitting: Hematology and Oncology

## 2020-09-21 ENCOUNTER — Other Ambulatory Visit: Payer: Self-pay

## 2020-09-21 ENCOUNTER — Inpatient Hospital Stay: Payer: PPO

## 2020-09-21 ENCOUNTER — Encounter: Payer: Self-pay | Admitting: Hematology and Oncology

## 2020-09-21 ENCOUNTER — Inpatient Hospital Stay: Payer: PPO | Attending: Hematology and Oncology | Admitting: Hematology and Oncology

## 2020-09-21 VITALS — BP 135/79 | HR 80 | Temp 97.9°F | Resp 17 | Wt 156.2 lb

## 2020-09-21 DIAGNOSIS — Z833 Family history of diabetes mellitus: Secondary | ICD-10-CM | POA: Insufficient documentation

## 2020-09-21 DIAGNOSIS — Z803 Family history of malignant neoplasm of breast: Secondary | ICD-10-CM | POA: Insufficient documentation

## 2020-09-21 DIAGNOSIS — Z7982 Long term (current) use of aspirin: Secondary | ICD-10-CM | POA: Diagnosis not present

## 2020-09-21 DIAGNOSIS — D500 Iron deficiency anemia secondary to blood loss (chronic): Secondary | ICD-10-CM | POA: Insufficient documentation

## 2020-09-21 DIAGNOSIS — D509 Iron deficiency anemia, unspecified: Secondary | ICD-10-CM | POA: Diagnosis not present

## 2020-09-21 DIAGNOSIS — Z8249 Family history of ischemic heart disease and other diseases of the circulatory system: Secondary | ICD-10-CM | POA: Diagnosis not present

## 2020-09-21 DIAGNOSIS — Z79899 Other long term (current) drug therapy: Secondary | ICD-10-CM | POA: Diagnosis not present

## 2020-09-21 NOTE — Progress Notes (Signed)
[place] NOTE  Patient Care Team: Mosie Lukes, MD as PCP - General (Family Medicine) Dorothy Spark, MD as PCP - Cardiology (Cardiology) [place]verstock, Jennefer Bravo, MD as Referring Physician (Dermatology) Brand Males, MD as Consulting Physician (Pulmonary Disease) Dorothy Spark, MD as Consulting Physician (Cardiology) Dian Queen, MD as Consulting Physician (Obstetrics and Gynecology) Melissa Montane, MD as Consulting Physician (Otolaryngology) Pyrtle, Lajuan Lines, MD as Consulting Physician (Gastroenterology) Pieter Partridge, DO as Consulting Physician (Neurology)  CHIEF COMPLAINTS/PURPOSE OF CONSULTATION:  Iron deficiency  ASSESSMENT & PLAN:  Iron deficiency anemia This is a very pleasant 65 year old female patient with a past medical history significant for some GI bleed early 2022 referred to hematology for recommendations regarding persistent iron deficiency and intolerance to oral iron.  She has had endoscopy and colonoscopy in June 2025 which showed a large hiatal hernia with mild sigmoid colon but no other findings. She has followed up with her gastroenterologist recently after the GI bleed, had a CT abdomen pelvis which showed some diverticulosis and possibly mild diverticulitis and again no other findings. Given her persistent iron deficiency and her poor tolerance to oral iron and her nonspecific symptoms such as headaches, shortness of breath, tinnitus, neck pain and general feeling of not feeling well, I think it is reasonable to try intravenous iron.  We have discussed that intravenous iron is very well-tolerated although there could be some serious allergic reactions in a very small percentage of patients. She is agreeable to trying Injectafer, ordered 2 weekly infusions. She will return to clinic in 3 to 4 months to repeat labs.  Family history of breast cancer There appears to be a significant family history of breast cancer.  Mom had  breast cancer and died from metastatic breast cancer in her 58s.  Sister had bilateral breast cancer in sister's daughter had breast cancer at the age of 69. I recommended genetic testing and a referral to genetics was placed.  Patient is agreeable.  Orders Placed This Encounter  Procedures  . Ambulatory referral to Genetics    Referral Priority:   Routine    Referral Type:   Consultation    Referral Reason:   Specialty Services Required    Number of Visits Requested:   1     HISTORY OF PRESENTING ILLNESS:  Victoria [name] 65 y.o. female is here because of Iron deficiency.  This is a very pleasant 65 year old female patient, excellent historian with past medical history significant for asthma, coronary artery disease, obstructive sleep apnea who most recently had GI bleeding in January 2022.  She had about 6 bowel movements without blood in it.  She was followed up by gastroenterology after and had CT abdomen pelvis which showed diffuse colonic diverticulosis with short segment wall thickening involving sigmoid colon likely accentuated by underdistention.  Given the presence of associated colonic diverticulosis and patient symptoms mild diverticulitis is possible.  She followed up with Dr. Hilarie Fredrickson her gastroenterologist,, had endoscopy and colonoscopy in June 2021 which showed some erythema in the sigmoid colon, large hiatal hernia negative biopsy results, otherwise no findings.  During his work-up she was noticed to have iron deficiency.  She has tried oral iron but cannot tolerate it, she does not feel well in her stomach when she takes iron.  She tells me that for the past 2 or 3 years, she has had headaches, tinnitus, dizziness, shortness of breath, followed up with multiple specialists and had no definitive conclusive diagnosis.  She wondered if all of this could be related to her iron deficiency Iron deficiency is a longstanding issue.  She consumes some foods rich in iron regularly and does not  think it is her diet.  She was hoping if we could consider intravenous iron since she has severe iron deficiency and since she could not tolerate oral iron. She currently denies any blood in her stool.  Rest of the history is also pertinent for significant breast cancer history in the family, mom had breast cancer and died from metastatic breast cancer.  Her sister had breast cancer bilateral and her sister's daughter also had breast cancer at the age of 75 which is likely pregnancy associated breast cancer. She continues to have these headaches, tinnitus, shortness of breath, neck pain but otherwise denies any other complaints. Rest of the pertinent 10 point ROS reviewed and negative.  MEDICAL HISTORY:  Past Medical History:  Diagnosis Date  . 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 Anemia    iron deficiency, resolved  . Anxiety   . Asthma   . CAD (coronary artery disease)    a. LHC 6/16:  oOM1 60, pRCA 25 >> med Rx b. cath 3/19 2nd OM with 95% stenosis s/p synergy DES & anomalous RCA  . CAP (community acquired pneumonia) 2016; 06/07/2016  . Chronic bronchitis ([place])   . Chronic lower back pain   . Complication of anesthesia    "think I have a hard time waking up from it"  . COPD (chronic obstructive pulmonary disease) ([place])   . Depression    mild  . Diverticulitis   . Diverticulosis   . GERD (gastroesophageal reflux disease)   . H/O hiatal hernia   . Headache    "weekly" (08/23/2017)  . History of echocardiogram    Echo 6/16:  Mod LVH, EF 60-65%, no RWMA, Gr 1 DD, trivial MR, normal LA size.  Marland Kitchen Hyperglycemia 11/20/2015  . Hyperlipidemia, mixed 09/11/2007   Qualifier: Diagnosis of  By: Jerold Coombe   Did not tolerate Lipitor, zocor, Lovastatin, Pravastatin, Livalo, Crestor even low dose   . IBS (irritable bowel syndrome)   . Maxillary sinusitis   . Normal cardiac stress test 11/2011   No evidence of ischemia or infarct.  Calculated ejection fraction 72%.  . Obesity   . OSA (obstructive sleep apnea) 02/2012   has stopped using  cpap  . Osteoarthritis   . Osteoporosis   . Pneumonia 11/2011   "before 2013 I hadn't had pneumonia since I was a child" (04/13/2012)  . Pulmonary nodules   . S/P angioplasty with stent 08/23/17 ostial 2nd OM with DES synnergy 08/24/2017  . Schatzki's ring     SURGICAL HISTORY: Past Surgical History:  Procedure Laterality Date  . APPENDECTOMY  1989  . CARDIAC CATHETERIZATION N/A 11/25/2014   Procedure: Right/Left Heart Cath and Coronary Angiography;  Surgeon: Belva Crome, MD;  Location: [facility];  Service: Cardiovascular;  Laterality: N/A;  . CESAREAN SECTION  1985  . CORONARY ANGIOPLASTY WITH STENT PLACEMENT  08/23/2017  . CORONARY STENT INTERVENTION N/A 08/23/2017   Procedure: CORONARY STENT INTERVENTION;  Surgeon: Burnell Blanks, MD;  Location: [facility];  Service: Cardiovascular;  Laterality: N/A;  . HERNIA REPAIR  04/13/2012   VHR laparoscopic  . LEFT HEART CATH AND CORONARY ANGIOGRAPHY N/A 08/23/2017   Procedure: LEFT HEART CATH AND CORONARY ANGIOGRAPHY;  Surgeon: Burnell Blanks, MD;  Location: [facility];  Service: Cardiovascular;  Laterality: N/A;  . VENTRAL HERNIA REPAIR  04/13/2012   Procedure: LAPAROSCOPIC VENTRAL HERNIA;  Surgeon: Adin Hector, MD;  Location: [facility];  Service: General;  Laterality: N/A;  laparoscopic repair of incarcerated hernia    SOCIAL HISTORY: Social History   Socioeconomic History  . Marital status: Married    Spouse name: Not on file  . Number of children: 1  . Years of education: 37  . Highest education level: Associate degree: academic program  Occupational History  . Occupation: Disabled   Tobacco Use  . Smoking status: Never Smoker  . Smokeless tobacco: Never Used  Vaping Use  . Vaping Use: Never used  Substance and Sexual Activity  . Alcohol use: Yes    Comment: social use  . Drug  use: No  . Sexual activity: Yes    Comment: gluten free, lives with husband and son with CP quadriplegia  Other Topics Concern  . 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    Lives at home with her husband and son.   Right-handed.   2 cups caffeine per day.   One story home   Social Determinants of Health   Financial Resource Strain: Not on file  Food Insecurity: Not on file  Transportation Needs: Not on file  Physical Activity: Not on file  Stress: Not on file  Social Connections: Not on file  Intimate Partner Violence: Not on file    FAMILY HISTORY: Family History  Problem Relation Age of Onset  . Breast cancer Mother   . Hypertension Mother   . Diabetes Mother   . Cancer Mother        recurrent, metastatic breast cancer.   . Diverticulosis Father   . Prostate cancer Father   . Pulmonary embolism Brother        recurrent  . Heart attack Maternal Grandfather   . Breast cancer Sister   . Cancer Sister        breast cancer, invasive ductal carcinoma in 2022,DCIS at 89 with 4 weeks of radiation, 5 years of Tamoxifen   . Stroke Neg Hx   . Colon cancer Neg Hx   . Esophageal cancer Neg Hx   . Stomach cancer Neg Hx   . Rectal cancer Neg Hx     ALLERGIES:  is allergic to beclomethasone dipropionate, flexeril [cyclobenzaprine], mometasone furo-formoterol fum, sulfonamide derivatives, and statins.  MEDICATIONS:  Current Outpatient Medications  Medication Sig Dispense Refill  . acyclovir ointment (ZOVIRAX) 5 % Apply 1 application topically every 3 (three) hours. 15 g 2  . alendronate (FOSAMAX) 70 MG tablet TAKE 1 TABLET BY MOUTH ONCE WEEKLY 4 tablet 11  . aspirin 81 MG tablet Take 81 mg by mouth daily.    . Benralizumab (FASENRA PEN) 30 MG/ML SOAJ Inject 30 mg into the skin every 8 (eight) weeks. 1 mL 5  . Bioflavonoid Products (ESTER C PO) Take 500 mg by mouth daily.    . Calcium-Magnesium-Vitamin D 300-150-400 MG-MG-UNIT TABS Take 1  tablet by mouth daily.    . chlorpheniramine-HYDROcodone (TUSSIONEX PENNKINETIC ER) 10-8 MG/5ML SUER Take 5 mLs by mouth every 12 (twelve) hours as needed for cough. 140 mL 0  . famotidine (PEPCID) 20 MG tablet TAKE 1 TABLET (20 MG TOTAL) BY MOUTH AT BEDTIME. 30 tablet 3  . gabapentin (NEURONTIN) 100 MG capsule Take 1 capsule (100 mg total) by mouth 3 (three)  times daily. 90 capsule 2  . LORazepam (ATIVAN) 1 MG tablet TAKE 1/2 TO 1 TABLET BY MOUTH 2 TIMES A DAY AS NEEDED FOR ANXIETY OR SLEEP 30 tablet 2  . MAGNESIUM GLYCINATE PO Take 400 mg by mouth daily.    . nitroGLYCERIN (NITROSTAT) 0.4 MG SL tablet PLACE 1 TABLET UNDER THE TONGUE EVERY 5 MINUTES AS NEEDED FOR CHEST PAIN. 25 tablet 4  . REPATHA SURECLICK 846 MG/ML SOAJ INJECT 1 PEN INTO THE SKIN EVERY 14 DAYS 6 mL 3  . tiZANidine (ZANAFLEX) 2 MG tablet Take 0.5-2 tablets (1-4 mg total) by mouth 2 (two) times daily as needed for muscle spasms. 30 tablet 1  . venlafaxine XR (EFFEXOR-XR) 150 MG 24 hr capsule Take 1 capsule (150 mg total) by mouth daily with breakfast. 90 capsule 1  . VITAMIN D PO Take 5,000 Units by mouth daily.    . Zinc 30 MG TABS Take 1 tablet by mouth daily.     No current facility-administered medications for this visit.     PHYSICAL EXAMINATION:  ECOG PERFORMANCE STATUS: 1 - Symptomatic but completely ambulatory  Vitals:   09/21/20 1130  BP: 135/79  Pulse: 80  Resp: 17  Temp: 97.9 F (36.6 C)   Filed Weights   09/21/20 1130  Weight: 156 lb 3.2 oz (70.9 kg)    GENERAL:alert, no distress and comfortable SKIN: skin color, texture, turgor are normal, no rashes or significant lesions EYES: normal, conjunctiva are pink and non-injected, sclera clear OROPHARYNX:no exudate, no erythema and lips, buccal mucosa, and tongue normal  NECK: supple, thyroid normal size, non-tender, without nodularity LYMPH:  no palpable lymphadenopathy in the cervical, axillary or inguinal LUNGS: clear to auscultation and percussion  with normal breathing effort HEART: regular rate & rhythm and no murmurs and no lower extremity edema ABDOMEN:abdomen soft, non-tender and normal bowel sounds Musculoskeletal:no cyanosis of digits and no clubbing  PSYCH: alert & oriented x 3 with fluent speech NEURO: no focal motor/sensory deficits Breast: Bilateral breasts examined, no palpable masses or regional adenopathy.  LABORATORY DATA:  I have reviewed the data as listed Lab Results  Component Value Date   WBC 5.6 09/01/2020   HGB 12.3 09/01/2020   HCT 36.8 09/01/2020   MCV 88.7 09/01/2020   PLT 305.0 09/01/2020     Chemistry      Component Value Date/Time   NA 136 06/30/2020 1420   NA 143 08/21/2017 1412   K 4.7 06/30/2020 1420   CL 101 06/30/2020 1420   CO2 30 06/30/2020 1420   BUN 14 06/30/2020 1420   BUN 13 08/21/2017 1412   CREATININE 0.64 06/30/2020 1420      Component Value Date/Time   CALCIUM 9.4 06/30/2020 1420   ALKPHOS 72 06/30/2020 1420   AST 17 06/30/2020 1420   ALT 15 06/30/2020 1420   BILITOT 0.3 06/30/2020 1420     I reviewed her most recent labs.  No evidence of anemia however she does have severe iron deficiency with a ferritin of 7  RADIOGRAPHIC STUDIES: I have personally reviewed the radiological images as listed and agreed with the findings in the report. No results found.  All questions were answered. The patient knows to call the clinic with any problems, questions or concerns. I spent 45 minutes in the care of this patient including H and P, review of records, counseling and coordination of care.     Benay Pike, MD 09/21/2020 1:30 PM

## 2020-09-21 NOTE — Assessment & Plan Note (Signed)
This is a very pleasant 65 year old female patient with a past medical history significant for some GI bleed early 2022 referred to hematology for recommendations regarding persistent iron deficiency and intolerance to oral iron.  She has had endoscopy and colonoscopy in June 2025 which showed a large hiatal hernia with mild sigmoid colon but no other findings. She has followed up with her gastroenterologist recently after the GI bleed, had a CT abdomen pelvis which showed some diverticulosis and possibly mild diverticulitis and again no other findings. Given her persistent iron deficiency and her poor tolerance to oral iron and her nonspecific symptoms such as headaches, shortness of breath, tinnitus, neck pain and general feeling of not feeling well, I think it is reasonable to try intravenous iron.  We have discussed that intravenous iron is very well-tolerated although there could be some serious allergic reactions in a very small percentage of patients. She is agreeable to trying Injectafer, ordered 2 weekly infusions. She will return to clinic in 3 to 4 months to repeat labs.

## 2020-09-21 NOTE — Assessment & Plan Note (Signed)
There appears to be a significant family history of breast cancer.  Mom had breast cancer and died from metastatic breast cancer in her 25s.  Sister had bilateral breast cancer in sister's daughter had breast cancer at the age of 48. I recommended genetic testing and a referral to genetics was placed.  Patient is agreeable.

## 2020-09-21 NOTE — Telephone Encounter (Signed)
Scheduled per los. Called and spoke with patient. Confirmed appts  

## 2020-09-22 DIAGNOSIS — M545 Low back pain, unspecified: Secondary | ICD-10-CM | POA: Diagnosis not present

## 2020-09-22 DIAGNOSIS — M542 Cervicalgia: Secondary | ICD-10-CM | POA: Diagnosis not present

## 2020-09-23 ENCOUNTER — Telehealth: Payer: Self-pay | Admitting: Hematology and Oncology

## 2020-09-23 DIAGNOSIS — M545 Low back pain, unspecified: Secondary | ICD-10-CM | POA: Diagnosis not present

## 2020-09-23 DIAGNOSIS — M4125 Other idiopathic scoliosis, thoracolumbar region: Secondary | ICD-10-CM | POA: Diagnosis not present

## 2020-09-23 DIAGNOSIS — M4316 Spondylolisthesis, lumbar region: Secondary | ICD-10-CM | POA: Diagnosis not present

## 2020-09-23 DIAGNOSIS — R262 Difficulty in walking, not elsewhere classified: Secondary | ICD-10-CM | POA: Diagnosis not present

## 2020-09-23 NOTE — Telephone Encounter (Signed)
Scheduled appt per 4/4 sch msg. Left msg with appt date and time.

## 2020-09-24 ENCOUNTER — Other Ambulatory Visit: Payer: Self-pay | Admitting: Family Medicine

## 2020-09-24 ENCOUNTER — Encounter: Payer: Self-pay | Admitting: Family Medicine

## 2020-09-24 DIAGNOSIS — Z79899 Other long term (current) drug therapy: Secondary | ICD-10-CM

## 2020-09-24 DIAGNOSIS — M810 Age-related osteoporosis without current pathological fracture: Secondary | ICD-10-CM

## 2020-09-25 ENCOUNTER — Inpatient Hospital Stay: Payer: PPO

## 2020-09-25 ENCOUNTER — Other Ambulatory Visit: Payer: Self-pay

## 2020-09-25 ENCOUNTER — Other Ambulatory Visit: Payer: Self-pay | Admitting: *Deleted

## 2020-09-25 VITALS — BP 136/71 | HR 76 | Temp 98.1°F | Resp 17

## 2020-09-25 DIAGNOSIS — D509 Iron deficiency anemia, unspecified: Secondary | ICD-10-CM

## 2020-09-25 MED ORDER — SODIUM CHLORIDE 0.9 % IV SOLN
Freq: Once | INTRAVENOUS | Status: AC
  Filled 2020-09-25: qty 250

## 2020-09-25 MED ORDER — FERRIC CARBOXYMALTOSE 750 MG/15ML IV SOLN
750.0000 mg | Freq: Once | INTRAVENOUS | Status: AC
  Administered 2020-09-25: 750 mg via INTRAVENOUS
  Filled 2020-09-25: qty 15

## 2020-09-25 MED ORDER — FAMOTIDINE IN NACL 20-0.9 MG/50ML-% IV SOLN
20.0000 mg | Freq: Once | INTRAVENOUS | Status: AC
  Administered 2020-09-25: 20 mg via INTRAVENOUS

## 2020-09-25 MED ORDER — ACETAMINOPHEN 325 MG PO TABS
650.0000 mg | ORAL_TABLET | Freq: Once | ORAL | Status: AC
  Administered 2020-09-25: 650 mg via ORAL

## 2020-09-25 NOTE — Patient Instructions (Signed)
Ferric carboxymaltose injection What is this medicine? FERRIC CARBOXYMALTOSE (ferr-ik car-box-ee-mol-toes) is an iron complex. Iron is used to make healthy red blood cells, which carry oxygen and nutrients throughout the body. This medicine is used to treat anemia in people with chronic kidney disease or people who cannot take iron by mouth. This medicine may be used for other purposes; ask your health care provider or pharmacist if you have questions. COMMON BRAND NAME(S): Injectafer What should I tell my health care provider before I take this medicine? They need to know if you have any of these conditions:  high levels of iron in the blood  liver disease  an unusual or allergic reaction to iron, other medicines, foods, dyes, or preservatives  pregnant or trying to get pregnant  breast-feeding How should I use this medicine? This medicine is for infusion into a vein. It is given by a health care professional in a hospital or clinic setting. Talk to your pediatrician regarding the use of this medicine in children. Special care may be needed. Overdosage: If you think you have taken too much of this medicine contact a poison control center or emergency room at once. NOTE: This medicine is only for you. Do not share this medicine with others. What if I miss a dose? Keep appointments for follow-up doses. It is important not to miss your dose. Call your care team if you are unable to keep an appointment. What may interact with this medicine? Do not take this medicine with any of the following medications:  deferoxamine  dimercaprol  other iron products This list may not describe all possible interactions. Give your health care provider a list of all the medicines, herbs, non-prescription drugs, or dietary supplements you use. Also tell them if you smoke, drink alcohol, or use illegal drugs. Some items may interact with your medicine. What should I watch for while using this  medicine? Visit your doctor or health care professional regularly. Tell your doctor if your symptoms do not start to get better or if they get worse. You may need blood work done while you are taking this medicine. You may need to follow a special diet. Talk to your doctor. Foods that contain iron include: whole grains/cereals, dried fruits, beans, or peas, leafy green vegetables, and organ meats (liver, kidney). What side effects may I notice from receiving this medicine? Side effects that you should report to your doctor or health care professional as soon as possible:  allergic reactions like skin rash, itching or hives, swelling of the face, lips, or tongue  dizziness  facial flushing Side effects that usually do not require medical attention (report to your doctor or health care professional if they continue or are bothersome):  changes in taste  constipation  headache  nausea, vomiting  pain, redness, or irritation at site where injected This list may not describe all possible side effects. Call your doctor for medical advice about side effects. You may report side effects to FDA at 1-800-FDA-1088. Where should I keep my medicine? This drug is given in a hospital or clinic and will not be stored at home. NOTE: This sheet is a summary. It may not cover all possible information. If you have questions about this medicine, talk to your doctor, pharmacist, or health care provider.  2021 Elsevier/Gold Standard (2020-05-19 14:00:47)  

## 2020-09-25 NOTE — Telephone Encounter (Signed)
Patient notified of Dr. Rhae Lerner note.  She will get blood work done today and would like for [name] to start the process for the Prolia.  She is willing to wait for bone density if they will not cover now.

## 2020-09-29 ENCOUNTER — Other Ambulatory Visit (INDEPENDENT_AMBULATORY_CARE_PROVIDER_SITE_OTHER): Payer: PPO

## 2020-09-29 ENCOUNTER — Telehealth: Payer: Self-pay

## 2020-09-29 DIAGNOSIS — M542 Cervicalgia: Secondary | ICD-10-CM | POA: Diagnosis not present

## 2020-09-29 DIAGNOSIS — M810 Age-related osteoporosis without current pathological fracture: Secondary | ICD-10-CM | POA: Diagnosis not present

## 2020-09-29 DIAGNOSIS — M545 Low back pain, unspecified: Secondary | ICD-10-CM | POA: Diagnosis not present

## 2020-09-29 LAB — COMPREHENSIVE METABOLIC PANEL
ALT: 22 U/L (ref 0–35)
AST: 21 U/L (ref 0–37)
Albumin: 4.2 g/dL (ref 3.5–5.2)
Alkaline Phosphatase: 73 U/L (ref 39–117)
BUN: 15 mg/dL (ref 6–23)
CO2: 27 mEq/L (ref 19–32)
Calcium: 9 mg/dL (ref 8.4–10.5)
Chloride: 102 mEq/L (ref 96–112)
Creatinine, Ser: 0.61 mg/dL (ref 0.40–1.20)
GFR: 95.16 mL/min (ref >60.00)
Glucose, Bld: 86 mg/dL (ref 70–99)
Potassium: 3.8 mEq/L (ref 3.5–5.1)
Sodium: 137 mEq/L (ref 135–145)
Total Bilirubin: 0.3 mg/dL (ref 0.2–1.2)
Total Protein: 7.1 g/dL (ref 6.0–8.3)

## 2020-09-29 LAB — VITAMIN D 25 HYDROXY (VIT D DEFICIENCY, FRACTURES): VITD: 62.7 ng/mL (ref 30.00–100.00)

## 2020-09-29 NOTE — Telephone Encounter (Signed)
-----   Message from Doylene Canning, [place] sent at 09/25/2020  9:28 AM EDT ----- Regarding: Prolia This patient would like to start the process on Prolia.  She is getting labs drawn today for cmp and vit d.  Last bone density was last in 01/2020

## 2020-09-29 NOTE — Telephone Encounter (Signed)
Clinical info submitted to insurance company. Waiting on summary of benefits to determine coverage. Will call patient to discuss once received.  

## 2020-10-01 ENCOUNTER — Telehealth: Payer: Self-pay | Admitting: Pharmacist

## 2020-10-01 ENCOUNTER — Other Ambulatory Visit: Payer: PPO

## 2020-10-01 ENCOUNTER — Encounter: Payer: PPO | Admitting: Genetic Counselor

## 2020-10-01 ENCOUNTER — Encounter: Payer: Self-pay | Admitting: Hematology and Oncology

## 2020-10-01 NOTE — Telephone Encounter (Signed)
Patient called stating she did not receive Fasenra injection at home.  Advised that she must call [employer] to schedule shipment. Provided phone number to patient.  She requested to pick up sample tomorrow since she will be out of town next week. Advised that our clinic is closed tomorrow. She plans to call [employer] next Wednesday to schedule shipment to her home on 10/13/20.  [employer] does not ship on Mondays and Fridays for cold-chain items and are unable to set shipment date, so patient will have to call next week.  Patient verbalized understanding. Nothing further needed.

## 2020-10-02 ENCOUNTER — Inpatient Hospital Stay: Payer: PPO

## 2020-10-02 ENCOUNTER — Other Ambulatory Visit: Payer: Self-pay

## 2020-10-02 ENCOUNTER — Telehealth: Payer: Self-pay | Admitting: Hematology and Oncology

## 2020-10-02 VITALS — BP 131/61 | HR 74 | Temp 97.8°F | Resp 16

## 2020-10-02 DIAGNOSIS — D509 Iron deficiency anemia, unspecified: Secondary | ICD-10-CM

## 2020-10-02 MED ORDER — ACETAMINOPHEN 325 MG PO TABS
ORAL_TABLET | ORAL | Status: AC
  Filled 2020-10-02: qty 2

## 2020-10-02 MED ORDER — FERRIC CARBOXYMALTOSE 750 MG/15ML IV SOLN
750.0000 mg | Freq: Once | INTRAVENOUS | Status: AC
  Administered 2020-10-02: 750 mg via INTRAVENOUS
  Filled 2020-10-02: qty 15

## 2020-10-02 MED ORDER — FAMOTIDINE IN NACL 20-0.9 MG/50ML-% IV SOLN
INTRAVENOUS | Status: AC
  Filled 2020-10-02: qty 50

## 2020-10-02 MED ORDER — SODIUM CHLORIDE 0.9 % IV SOLN
Freq: Once | INTRAVENOUS | Status: AC
  Filled 2020-10-02: qty 250

## 2020-10-02 MED ORDER — FAMOTIDINE IN NACL 20-0.9 MG/50ML-% IV SOLN
20.0000 mg | Freq: Once | INTRAVENOUS | Status: AC
  Administered 2020-10-02: 20 mg via INTRAVENOUS

## 2020-10-02 MED ORDER — ACETAMINOPHEN 325 MG PO TABS
650.0000 mg | ORAL_TABLET | Freq: Once | ORAL | Status: AC
  Administered 2020-10-02: 650 mg via ORAL

## 2020-10-02 NOTE — Telephone Encounter (Signed)
Scheduled appt per 4/15 sch msg. Pt aware.  

## 2020-10-02 NOTE — Patient Instructions (Signed)
Ferric carboxymaltose injection What is this medicine? FERRIC CARBOXYMALTOSE (ferr-ik car-box-ee-mol-toes) is an iron complex. Iron is used to make healthy red blood cells, which carry oxygen and nutrients throughout the body. This medicine is used to treat anemia in people with chronic kidney disease or people who cannot take iron by mouth. This medicine may be used for other purposes; ask your health care provider or pharmacist if you have questions. COMMON BRAND NAME(S): Injectafer What should I tell my health care provider before I take this medicine? They need to know if you have any of these conditions:  high levels of iron in the blood  liver disease  an unusual or allergic reaction to iron, other medicines, foods, dyes, or preservatives  pregnant or trying to get pregnant  breast-feeding How should I use this medicine? This medicine is for infusion into a vein. It is given by a health care professional in a hospital or clinic setting. Talk to your pediatrician regarding the use of this medicine in children. Special care may be needed. Overdosage: If you think you have taken too much of this medicine contact a poison control center or emergency room at once. NOTE: This medicine is only for you. Do not share this medicine with others. What if I miss a dose? Keep appointments for follow-up doses. It is important not to miss your dose. Call your care team if you are unable to keep an appointment. What may interact with this medicine? Do not take this medicine with any of the following medications:  deferoxamine  dimercaprol  other iron products This list may not describe all possible interactions. Give your health care provider a list of all the medicines, herbs, non-prescription drugs, or dietary supplements you use. Also tell them if you smoke, drink alcohol, or use illegal drugs. Some items may interact with your medicine. What should I watch for while using this  medicine? Visit your doctor or health care professional regularly. Tell your doctor if your symptoms do not start to get better or if they get worse. You may need blood work done while you are taking this medicine. You may need to follow a special diet. Talk to your doctor. Foods that contain iron include: whole grains/cereals, dried fruits, beans, or peas, leafy green vegetables, and organ meats (liver, kidney). What side effects may I notice from receiving this medicine? Side effects that you should report to your doctor or health care professional as soon as possible:  allergic reactions like skin rash, itching or hives, swelling of the face, lips, or tongue  dizziness  facial flushing Side effects that usually do not require medical attention (report to your doctor or health care professional if they continue or are bothersome):  changes in taste  constipation  headache  nausea, vomiting  pain, redness, or irritation at site where injected This list may not describe all possible side effects. Call your doctor for medical advice about side effects. You may report side effects to FDA at 1-800-FDA-1088. Where should I keep my medicine? This drug is given in a hospital or clinic and will not be stored at home. NOTE: This sheet is a summary. It may not cover all possible information. If you have questions about this medicine, talk to your doctor, pharmacist, or health care provider.  2021 Elsevier/Gold Standard (2020-05-19 14:00:47)  

## 2020-10-13 ENCOUNTER — Ambulatory Visit: Payer: PPO | Admitting: Cardiology

## 2020-10-15 DIAGNOSIS — M542 Cervicalgia: Secondary | ICD-10-CM | POA: Diagnosis not present

## 2020-10-15 DIAGNOSIS — M545 Low back pain, unspecified: Secondary | ICD-10-CM | POA: Diagnosis not present

## 2020-10-20 DIAGNOSIS — M542 Cervicalgia: Secondary | ICD-10-CM | POA: Diagnosis not present

## 2020-10-20 DIAGNOSIS — M545 Low back pain, unspecified: Secondary | ICD-10-CM | POA: Diagnosis not present

## 2020-10-21 NOTE — Telephone Encounter (Signed)
Patient is checking the status of prolia

## 2020-10-21 NOTE — Telephone Encounter (Signed)
Patient spoke to Health team an they stated they haven't received any paperwork from [name].

## 2020-10-26 NOTE — Telephone Encounter (Signed)
Ok to schedule patient for prolia. She can have NV at her convenience.

## 2020-10-27 ENCOUNTER — Telehealth: Payer: Self-pay | Admitting: Hematology and Oncology

## 2020-10-27 DIAGNOSIS — M545 Low back pain, unspecified: Secondary | ICD-10-CM | POA: Diagnosis not present

## 2020-10-27 DIAGNOSIS — M542 Cervicalgia: Secondary | ICD-10-CM | POA: Diagnosis not present

## 2020-10-27 NOTE — Telephone Encounter (Signed)
Cancelled appts per 5/10 sch msg. Called pt, no answer. Left msg for pt to call back if interested in r/s.

## 2020-10-28 NOTE — Telephone Encounter (Signed)
appt schedule with pt

## 2020-10-29 ENCOUNTER — Other Ambulatory Visit: Payer: Self-pay

## 2020-10-29 ENCOUNTER — Ambulatory Visit (INDEPENDENT_AMBULATORY_CARE_PROVIDER_SITE_OTHER): Payer: PPO

## 2020-10-29 DIAGNOSIS — M810 Age-related osteoporosis without current pathological fracture: Secondary | ICD-10-CM | POA: Diagnosis not present

## 2020-10-29 MED ORDER — DENOSUMAB 60 MG/ML ~~LOC~~ SOSY
60.0000 mg | PREFILLED_SYRINGE | Freq: Once | SUBCUTANEOUS | Status: AC
  Administered 2020-10-29: 60 mg via SUBCUTANEOUS

## 2020-10-29 NOTE — Progress Notes (Signed)
Pt here today for Prolia injection. Injection administered on her left arm and patient tolerated well. -Jma

## 2020-10-30 ENCOUNTER — Ambulatory Visit: Payer: PPO

## 2020-11-03 ENCOUNTER — Inpatient Hospital Stay: Payer: PPO | Admitting: Hematology and Oncology

## 2020-11-03 DIAGNOSIS — M545 Low back pain, unspecified: Secondary | ICD-10-CM | POA: Diagnosis not present

## 2020-11-03 DIAGNOSIS — M542 Cervicalgia: Secondary | ICD-10-CM | POA: Diagnosis not present

## 2020-11-05 ENCOUNTER — Ambulatory Visit: Payer: PPO | Admitting: Hematology and Oncology

## 2020-11-09 NOTE — Progress Notes (Signed)
Cardiology Office Note:    Date:  11/11/2020   ID:  SHAVELLE RUNKEL, DOB 1955/12/26, MRN [id number]  PCP:  Mosie Lukes, MD   [facility] Providers Cardiologist:  Ena Dawley, MD (Inactive) {   Referring MD: Mosie Lukes, MD    History of Present Illness:    Victoria [name] is a 65 y.o. female with a hx of CAD s/p DES to OM on 08/2017, HLD on PCSK9i, anxiety and OSA on CPAP who was previously followed by Dr. Meda Coffee who now returns to clinic for follow-up of her CAD.   Per review of the record, the patient has a long history of chest pain. Myoview 02/2019 low risk. Chest CT 04/02/20 showed chronic scarring in right lower lobe nodule and large hiatal hernia which was felt to be causing some of her symptoms.  She last saw Ermalinda Barrios 04/07/20 where she was dealing with chest pain thought to be related to underlying asthma  The patient states that she overall feels well today. She has been dealing with scoliosis and osteroporosis and is seeing PT. Back in 06/2020, she had a suspected GIB and she received two iron infusions with improvement. She has been placed on famotidine as well. Denies any blood in the stool or urine. From a CV standpoint she is doing well with no chest pain, SOB, orthopnea, PND, syncope or palpitations.  Currently, she is taking repatha every 3 weeks as had muscle aches with every 2 weeks.   Past Medical History:  Diagnosis Date  . 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 Anemia    iron deficiency, resolved  . Anxiety   . Asthma   . CAD (coronary artery disease)    a. LHC 6/16:  oOM1 60, pRCA 25 >> med Rx b. cath 3/19 2nd OM with 95% stenosis s/p synergy DES & anomalous RCA  . CAP (community acquired pneumonia) 2016; 06/07/2016  . Chronic bronchitis ([place])   . Chronic lower back pain   . Complication of anesthesia    "think I have a hard time waking up from it"  . COPD (chronic obstructive  pulmonary disease) ([place])   . Depression    mild  . Diverticulitis   . Diverticulosis   . GERD (gastroesophageal reflux disease)   . H/O hiatal hernia   . Headache    "weekly" (08/23/2017)  . History of echocardiogram    Echo 6/16:  Mod LVH, EF 60-65%, no RWMA, Gr 1 DD, trivial MR, normal LA size.  Marland Kitchen Hyperglycemia 11/20/2015  . Hyperlipidemia, mixed 09/11/2007   Qualifier: Diagnosis of  By: Jerold Coombe   Did not tolerate Lipitor, zocor, Lovastatin, Pravastatin, Livalo, Crestor even low dose   . IBS (irritable bowel syndrome)   . Maxillary sinusitis   . Normal cardiac stress test 11/2011   No evidence of ischemia or infarct. Calculated ejection fraction 72%.  . Obesity   . OSA (obstructive sleep apnea) 02/2012   has stopped using  cpap  . Osteoarthritis   . Osteoporosis   . Pneumonia 11/2011   "before 2013 I hadn't had pneumonia since I was a child" (04/13/2012)  . Pulmonary nodules   . S/P angioplasty with stent 08/23/17 ostial 2nd OM with DES synnergy 08/24/2017  . Schatzki's ring     Past Surgical History:  Procedure Laterality Date  . APPENDECTOMY  1989  . CARDIAC CATHETERIZATION N/A 11/25/2014   Procedure: Right/Left Heart Cath and  Coronary Angiography;  Surgeon: Belva Crome, MD;  Location: [place] [facility];  Service: Cardiovascular;  Laterality: N/A;  . CESAREAN SECTION  1985  . CORONARY ANGIOPLASTY WITH STENT PLACEMENT  08/23/2017  . CORONARY STENT INTERVENTION N/A 08/23/2017   Procedure: CORONARY STENT INTERVENTION;  Surgeon: Burnell Blanks, MD;  Location: [place] [facility];  Service: Cardiovascular;  Laterality: N/A;  . HERNIA REPAIR  04/13/2012   VHR laparoscopic  . LEFT HEART CATH AND CORONARY ANGIOGRAPHY N/A 08/23/2017   Procedure: LEFT HEART CATH AND CORONARY ANGIOGRAPHY;  Surgeon: Burnell Blanks, MD;  Location: [place] [facility];  Service: Cardiovascular;  Laterality: N/A;  . VENTRAL HERNIA REPAIR  04/13/2012   Procedure: LAPAROSCOPIC VENTRAL  HERNIA;  Surgeon: Adin Hector, MD;  Location: [place];  Service: General;  Laterality: N/A;  laparoscopic repair of incarcerated hernia    Current Medications: Current Meds  Medication Sig  . acyclovir ointment (ZOVIRAX) 5 % Apply 1 application topically every 3 (three) hours.  Marland Kitchen aspirin 81 MG tablet Take 81 mg by mouth daily.  . Benralizumab (FASENRA PEN) 30 MG/ML SOAJ Inject 30 mg into the skin every 8 (eight) weeks.  Marland Kitchen Bioflavonoid Products (ESTER C PO) Take 500 mg by mouth daily.  . Calcium-Magnesium-Vitamin D 300-150-400 MG-MG-UNIT TABS Take 1 tablet by mouth daily.  . chlorpheniramine-HYDROcodone (TUSSIONEX PENNKINETIC ER) 10-8 MG/5ML SUER Take 5 mLs by mouth every 12 (twelve) hours as needed for cough.  . denosumab (PROLIA) 60 MG/ML SOSY injection Inject 60 mg into the skin every 6 (six) months.  . famotidine (PEPCID) 20 MG tablet TAKE 1 TABLET (20 MG TOTAL) BY MOUTH AT BEDTIME.  Marland Kitchen gabapentin (NEURONTIN) 100 MG capsule Take 100 mg by mouth 3 (three) times daily as needed.  Marland Kitchen LORazepam (ATIVAN) 1 MG tablet TAKE 1/2 TO 1 TABLET BY MOUTH 2 TIMES A DAY AS NEEDED FOR ANXIETY OR SLEEP  . MAGNESIUM GLYCINATE PO Take 400 mg by mouth daily.  . nitroGLYCERIN (NITROSTAT) 0.4 MG SL tablet PLACE 1 TABLET UNDER THE TONGUE EVERY 5 MINUTES AS NEEDED FOR CHEST PAIN.  Marland Kitchen REPATHA SURECLICK [id number] MG/ML SOAJ INJECT 1 PEN INTO THE SKIN EVERY 14 DAYS  . venlafaxine XR (EFFEXOR-XR) 150 MG 24 hr capsule Take 1 capsule (150 mg total) by mouth daily with breakfast.  . VITAMIN D PO Take 5,000 Units by mouth daily.  Marland Kitchen ZINC CITRATE PO Take 30 mg by mouth daily.     Allergies:   Beclomethasone dipropionate, Flexeril [cyclobenzaprine], Mometasone furo-formoterol fum, Sulfonamide derivatives, and Statins   Social History   Socioeconomic History  . Marital status: Married    Spouse name: Not on file  . Number of children: 1  . Years of education: 90  . Highest education level: Associate degree: academic  program  Occupational History  . Occupation: Disabled   Tobacco Use  . Smoking status: Never Smoker  . Smokeless tobacco: Never Used  Vaping Use  . Vaping Use: Never used  Substance and Sexual Activity  . Alcohol use: Yes    Comment: social use  . Drug use: No  . Sexual activity: Yes    Comment: gluten free, lives with husband and son with CP quadriplegia  Other Topics Concern  . 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    Lives at home with her husband and son.   Right-handed.   2 cups caffeine per day.   One story  home   Social Determinants of Health   Financial Resource Strain: Not on file  Food Insecurity: Not on file  Transportation Needs: Not on file  Physical Activity: Not on file  Stress: Not on file  Social Connections: Not on file     Family History: The patient's family history includes Breast cancer in her mother and sister; Cancer in her mother and sister; Diabetes in her mother; Diverticulosis in her father; Heart attack in her maternal grandfather; Hypertension in her mother; Prostate cancer in her father; Pulmonary embolism in her brother. There is no history of Stroke, Colon cancer, Esophageal cancer, Stomach cancer, or Rectal cancer.  ROS:   Please see the history of present illness.    Review of Systems  Constitutional: Negative for chills and fever.  HENT: Negative for sore throat.   Eyes: Negative for redness.  Respiratory: Positive for cough.   Cardiovascular: Negative for chest pain, palpitations, orthopnea, claudication, leg swelling and PND.  Gastrointestinal: Positive for heartburn. Negative for blood in stool and melena.  Genitourinary: Negative for hematuria.  Neurological: Negative for dizziness and loss of consciousness.  Psychiatric/Behavioral: Negative for substance abuse.    EKGs/Labs/Other Studies Reviewed:    The following studies were reviewed today: Echocardiogram 03/20/2019 IMPRESSIONS 1.  Left ventricular ejection fraction, by visual estimation, is 60 to 65%. The left ventricle has normal function. Normal left ventricular size. Mildly increased left ventricular posterior wall thickness. There is mildly increased left ventricular  hypertrophy. 2. Elevated left ventricular end-diastolic pressure. 3. Left ventricular diastolic Doppler parameters are consistent with impaired relaxation pattern of LV diastolic filling. 4. Global right ventricle has normal systolic function.The right ventricular size is normal. No increase in right ventricular wall thickness. 5. Left atrial size was normal. 6. Right atrial size was normal. 7. The mitral valve is normal in structure. No evidence of mitral valve regurgitation. No evidence of mitral stenosis. 8. The tricuspid valve is normal in structure. Tricuspid valve regurgitation is trivial. 9. The aortic valve is normal in structure. Aortic valve regurgitation is mild by color flow Doppler. Structurally normal aortic valve, with no evidence of sclerosis or stenosis. 10. The pulmonic valve was normal in structure. Pulmonic valve regurgitation is not visualized by color flow Doppler. 11. Aortic dilatation noted. 12. There is mild dilatation of the ascending aorta measuring 36 mm. 13. Normal pulmonary artery systolic pressure. 14. The inferior vena cava is normal in size with greater than 50% respiratory variability, suggesting right atrial pressure of 3 mmHg.  Exercise Myoview 03/15/2019 Study Highlights   Nuclear stress EF: 74%.  There was no ST segment deviation noted during stress.  No T wave inversion was noted during stress.  Blood pressure demonstrated a hypertensive response to exercise.  The study is normal.  This is a low risk study.  Low risk stress nuclear study with normal perfusion and normal left ventricular regional and global systolic function.    Carotid ultrasound 03/12/2019 Summary: Right Carotid:  Velocities in the right ICA are consistent with a 1-39% stenosis.  Left Carotid: Velocities in the left ICA are consistent with a 1-39% stenosis. Non-hemodynamically significant plaque <50% noted in the CCA.  Vertebrals: Bilateral vertebral arteries demonstrate antegrade flow. Subclavians: Normal flow hemodynamics were seen in bilateral subclavian arteries.   Left heart cath 08/23/2017  Conclusion   Ost 2nd Mrg lesion is 95% stenosed.  A drug-eluting stent was successfully placed using a STENT SYNERGY DES 3X16.  Post intervention, there is a 0% residual stenosis.  The left ventricular systolic function is normal.  LV end diastolic pressure is normal.  The left ventricular ejection fraction is 55-65% by visual estimate.  There is no mitral valve regurgitation.  1. Severe single vessel CAD with severe stenosis in the ostium of the large obtuse marginal branch.  2. Successful PTCA/DES x 1 ostium of the OM. 3. 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  4. The LAD has no obstructive disease.  5. Normal LV systolic function  Recommendations: Will continue DAPT with ASA and Plavix for at least one year. Consider addition of a beta blocker or nitrate.       EKG:  EKG not obtained today  Recent Labs: 06/30/2020: TSH 2.12 09/01/2020: Hemoglobin 12.3; Platelets 305.0 09/29/2020: ALT 22; BUN 15; Creatinine, Ser 0.61; Potassium 3.8; Sodium 137  Recent Lipid Panel    Component Value Date/Time   CHOL 166 06/30/2020 1420   CHOL 227 (H) 07/25/2016 0822   TRIG 92.0 06/30/2020 1420   HDL 62.80 06/30/2020 1420   HDL 51 07/25/2016 0822   CHOLHDL 3 06/30/2020 1420   VLDL 18.4 06/30/2020 1420   LDLCALC 85 06/30/2020 1420   LDLCALC 160 (H) 07/25/2016 0822   LDLDIRECT 177.8 09/07/2011 0945     Physical Exam:    VS:  BP 122/80   Pulse 81   Ht 5\' 1"  (1.549 m)   Wt 154 lb 3.2 oz (69.9 kg)    SpO2 98%   BMI 29.14 kg/m     Wt Readings from Last 3 Encounters:  11/11/20 154 lb 3.2 oz (69.9 kg)  09/21/20 156 lb 3.2 oz (70.9 kg)  09/01/20 156 lb 9.6 oz (71 kg)     GEN:  Well nourished, well developed in no acute distress HEENT: Normal NECK: No JVD; No carotid bruits CARDIAC: RRR, no murmurs, rubs, gallops RESPIRATORY:  Clear to auscultation without rales, wheezing or rhonchi  ABDOMEN: Soft, non-tender, non-distended MUSCULOSKELETAL:  No edema; No deformity  SKIN: Warm and dry NEUROLOGIC:  Alert and oriented x 3 PSYCHIATRIC:  Normal affect   ASSESSMENT:    1. Coronary artery disease involving native coronary artery of native heart without angina pectoris   2. Ascending aortic aneurysm ([place])   3. Encounter for laboratory examination   4. Hyperlipidemia, unspecified hyperlipidemia type   5. Statin myopathy   6. Precordial pain   7. Severe persistent asthma, unspecified whether complicated   8. OSA (obstructive sleep apnea)   9. Iron deficiency anemia, unspecified iron deficiency anemia type    PLAN:    In order of problems listed above:  #CAD s/p DES to OM on 08/2017: Stable with episodes of atypical chest pain. Myoview 02/2019 showed normal LVEF with normal perfusion. -Continue ASA 81mg  daily -Continue repatha 140mg  every 3 weeks (does not tolerate q 2 weeks due to muscle aches) -No BB due to severe asthma  #GIB: #Iron deficiency anemia: No current bleeding. S/p iron infusions x2. Follows with Heme. -Continue famotidine -Follow-up with heme as scheduled -If needed, can use PPI  #Severe Asthma: -Followed Ramaswamy -No BB due to severity of asthma  #HLD: #Statin Interolerance: LDL 85 in 06/2020. Overall near goal of 70 and did not tolerate statins or q2 week repatha. Will continue current regimen. -Continue repatha 140mg  q 3 weeks (has adjusted dosing to minimize side-effects)  #OSA: -Compliant with home CPAP  #Mild Ascending Aortic Aneurysm: Measured  37mm on TTE in 2020. -Obtain CTA  of the chest for serial monitoring     Medication Adjustments/Labs and Tests Ordered: Current medicines are reviewed at length with the patient today.  Concerns regarding medicines are outlined above.  Orders Placed This Encounter  Procedures  . CT ANGIO CHEST AORTA W/CM & OR WO/CM  . Basic metabolic panel   No orders of the defined types were placed in this encounter.   Patient Instructions  Medication Instructions:   Your physician recommends that you continue on your current medications as directed. Please refer to the Current Medication list given to you today.  *If you need a refill on your cardiac medications before your next appointment, please call your pharmacy*   Testing/Procedures:  CT ANGIO CHEST AORTA W/WO CONTRAST TO BE DONE HERE IN THE OFFICE IN October 2022 PER DR. Johney Frame   Follow-Up: At [facility], you and your health needs are our priority.  As part of our continuing mission to provide you with exceptional heart care, we have created designated Provider Care Teams.  These Care Teams include your primary Cardiologist (physician) and Advanced Practice Providers (APPs -  Physician Assistants and Nurse Practitioners) who all work together to provide you with the care you need, when you need it.  We recommend signing up for the patient portal called "MyChart".  Sign up information is provided on this After Visit Summary.  MyChart is used to connect with patients for Virtual Visits (Telemedicine).  Patients are able to view lab/test results, encounter notes, upcoming appointments, etc.  Non-urgent messages can be sent to your provider as well.   To learn more about what you can do with MyChart, go to [email or web address].    Your next appointment:   8 month(s)  The format for your next appointment:   In Person  Provider:   Gwyndolyn Kaufman, MD        Signed, Freada Bergeron, MD  11/11/2020 1:01 PM    Mobile

## 2020-11-11 ENCOUNTER — Ambulatory Visit: Payer: PPO | Admitting: Cardiology

## 2020-11-11 ENCOUNTER — Encounter: Payer: Self-pay | Admitting: Cardiology

## 2020-11-11 ENCOUNTER — Other Ambulatory Visit: Payer: Self-pay

## 2020-11-11 VITALS — BP 122/80 | HR 81 | Ht 61.0 in | Wt 154.2 lb

## 2020-11-11 DIAGNOSIS — I712 Thoracic aortic aneurysm, without rupture: Secondary | ICD-10-CM

## 2020-11-11 DIAGNOSIS — D509 Iron deficiency anemia, unspecified: Secondary | ICD-10-CM | POA: Diagnosis not present

## 2020-11-11 DIAGNOSIS — T466X5A Adverse effect of antihyperlipidemic and antiarteriosclerotic drugs, initial encounter: Secondary | ICD-10-CM | POA: Diagnosis not present

## 2020-11-11 DIAGNOSIS — Z0189 Encounter for other specified special examinations: Secondary | ICD-10-CM

## 2020-11-11 DIAGNOSIS — E785 Hyperlipidemia, unspecified: Secondary | ICD-10-CM

## 2020-11-11 DIAGNOSIS — J4550 Severe persistent asthma, uncomplicated: Secondary | ICD-10-CM | POA: Diagnosis not present

## 2020-11-11 DIAGNOSIS — G4733 Obstructive sleep apnea (adult) (pediatric): Secondary | ICD-10-CM | POA: Diagnosis not present

## 2020-11-11 DIAGNOSIS — I2510 Atherosclerotic heart disease of native coronary artery without angina pectoris: Secondary | ICD-10-CM | POA: Diagnosis not present

## 2020-11-11 DIAGNOSIS — R072 Precordial pain: Secondary | ICD-10-CM

## 2020-11-11 DIAGNOSIS — G720 Drug-induced myopathy: Secondary | ICD-10-CM | POA: Diagnosis not present

## 2020-11-11 DIAGNOSIS — I7121 Aneurysm of the ascending aorta, without rupture: Secondary | ICD-10-CM

## 2020-11-11 NOTE — Patient Instructions (Signed)
Medication Instructions:   Your physician recommends that you continue on your current medications as directed. Please refer to the Current Medication list given to you today.  *If you need a refill on your cardiac medications before your next appointment, please call your pharmacy*   Testing/Procedures:  CT ANGIO CHEST AORTA W/WO CONTRAST TO BE DONE HERE IN THE OFFICE IN October 2022 PER DR. Johney Frame   Follow-Up: At [facility], you and your health needs are our priority.  As part of our continuing mission to provide you with exceptional heart care, we have created designated Provider Care Teams.  These Care Teams include your primary Cardiologist (physician) and Advanced Practice Providers (APPs -  Physician Assistants and Nurse Practitioners) who all work together to provide you with the care you need, when you need it.  We recommend signing up for the patient portal called "MyChart".  Sign up information is provided on this After Visit Summary.  MyChart is used to connect with patients for Virtual Visits (Telemedicine).  Patients are able to view lab/test results, encounter notes, upcoming appointments, etc.  Non-urgent messages can be sent to your provider as well.   To learn more about what you can do with MyChart, go to [email or web address].    Your next appointment:   8 month(s)  The format for your next appointment:   In Person  Provider:   Gwyndolyn Kaufman, MD

## 2020-11-12 DIAGNOSIS — M542 Cervicalgia: Secondary | ICD-10-CM | POA: Diagnosis not present

## 2020-11-12 DIAGNOSIS — M545 Low back pain, unspecified: Secondary | ICD-10-CM | POA: Diagnosis not present

## 2020-11-17 ENCOUNTER — Encounter: Payer: PPO | Admitting: Genetic Counselor

## 2020-11-17 ENCOUNTER — Other Ambulatory Visit: Payer: PPO

## 2020-12-01 ENCOUNTER — Telehealth: Payer: Self-pay | Admitting: Internal Medicine

## 2020-12-01 ENCOUNTER — Telehealth: Payer: PPO | Admitting: Family Medicine

## 2020-12-01 ENCOUNTER — Other Ambulatory Visit: Payer: Self-pay

## 2020-12-01 NOTE — Telephone Encounter (Signed)
Patient called stating she was unable to order Fasenra injection. Provided her with phone number for [phone] to schedule refills based on approval letter. Patient advised of hours to call to schedule. She will save this number in her phone and schedule shipment. Dose of [name] is due this upcoming week.  Nothing further needed.  Knox Saliva, PharmD, MPH Clinical Pharmacist (Rheumatology and Pulmonology)

## 2020-12-10 ENCOUNTER — Other Ambulatory Visit: Payer: Self-pay | Admitting: Family Medicine

## 2020-12-10 NOTE — Telephone Encounter (Signed)
Requesting: lorazepam 1mg  Contract:07/29/2014 UDS: None  Last Visit: 09/01/2020 Next Visit: 03/04/2021 Last Refill: 07/01/2020 #30 and 2RF  Please Advise

## 2020-12-14 DIAGNOSIS — M25512 Pain in left shoulder: Secondary | ICD-10-CM | POA: Diagnosis not present

## 2020-12-17 DIAGNOSIS — M545 Low back pain, unspecified: Secondary | ICD-10-CM | POA: Diagnosis not present

## 2020-12-17 DIAGNOSIS — M542 Cervicalgia: Secondary | ICD-10-CM | POA: Diagnosis not present

## 2021-01-05 ENCOUNTER — Ambulatory Visit: Payer: PPO | Admitting: Hematology and Oncology

## 2021-01-05 ENCOUNTER — Other Ambulatory Visit: Payer: PPO

## 2021-01-19 ENCOUNTER — Telehealth: Payer: Self-pay | Admitting: Internal Medicine

## 2021-01-19 MED ORDER — PREDNISONE 10 MG PO TABS: ORAL_TABLET | ORAL | 0 refills | Status: AC

## 2021-01-19 NOTE — Telephone Encounter (Signed)
Try Take prednisone 40 mg daily x 2 days, then '20mg'$  daily x 2 days, then '10mg'$  daily x 2 days, then '5mg'$  daily x 2 days and stop ( I think she is off daily prednisone - but pls confirm that)

## 2021-01-19 NOTE — Telephone Encounter (Signed)
Spoke with pt.  C/O  Chest tightness, wheezing x 1 week Non Prod cough Can hear audible wheezing over the phone Denies fever or sorethroat. Husband had covid 12/29/20 - Pt tested neg at that time. Pt also covid tested this am which was negative. Covid Vaccine x 2 Booster x 2 (Last one 11/2020) Last Fasenra Inj 12/14/20 Started using Albuterol Neb yesterday with some relief.  Last MR Ov was 05/04/20.  Please advise

## 2021-01-19 NOTE — Telephone Encounter (Signed)
Called and spoke with pt letting her know recs stated by MR and she verbalized understanding. Verified preferred pharmacy and have sent Rx to pharmacy for pt. Nothing further needed.

## 2021-01-25 ENCOUNTER — Encounter: Payer: Self-pay | Admitting: Family Medicine

## 2021-01-26 ENCOUNTER — Other Ambulatory Visit: Payer: Self-pay | Admitting: Family Medicine

## 2021-01-27 NOTE — Telephone Encounter (Signed)
Patient is requesting a refill of the following medications: Requested Prescriptions   Pending Prescriptions Disp Refills   venlafaxine XR (EFFEXOR-XR) 150 MG 24 hr capsule [Pharmacy Med Name: VENLAFAXINE HCL ER 150 MG C 150 Capsule] 90 capsule 1    Sig: TAKE 1 CAPSULE BY MOUTH DAILY WITH BREAKFAST.    Date of patient request: 01/26/21 Last office visit: 09/01/20 Date of last refill: 06/04/20 Last refill amount: 90 + 1 Follow up time period per chart: 03/04/21

## 2021-01-28 ENCOUNTER — Encounter: Payer: Self-pay | Admitting: Family Medicine

## 2021-02-03 ENCOUNTER — Telehealth: Payer: Self-pay | Admitting: Family Medicine

## 2021-02-03 NOTE — Telephone Encounter (Signed)
Left message for patient to call back and schedule Medicare Annual Wellness Visit (AWV) in office.   If not able to come in office, please offer to do virtually or by telephone.  Left office number and my jabber #[phone].  Due for AWVI  Please schedule at anytime with Nurse Health Advisor.   

## 2021-02-04 DIAGNOSIS — Z1231 Encounter for screening mammogram for malignant neoplasm of breast: Secondary | ICD-10-CM | POA: Diagnosis not present

## 2021-02-04 DIAGNOSIS — Z01419 Encounter for gynecological examination (general) (routine) without abnormal findings: Secondary | ICD-10-CM | POA: Diagnosis not present

## 2021-02-04 DIAGNOSIS — Z6828 Body mass index (BMI) 28.0-28.9, adult: Secondary | ICD-10-CM | POA: Diagnosis not present

## 2021-02-04 LAB — HM MAMMOGRAPHY

## 2021-02-09 ENCOUNTER — Inpatient Hospital Stay: Payer: PPO | Admitting: Hematology and Oncology

## 2021-02-09 ENCOUNTER — Other Ambulatory Visit: Payer: Self-pay

## 2021-02-09 ENCOUNTER — Inpatient Hospital Stay: Payer: PPO | Attending: Hematology and Oncology

## 2021-02-09 ENCOUNTER — Encounter: Payer: Self-pay | Admitting: Hematology and Oncology

## 2021-02-09 VITALS — BP 121/58 | HR 84 | Temp 98.4°F | Resp 18 | Ht 61.0 in | Wt 156.5 lb

## 2021-02-09 DIAGNOSIS — E782 Mixed hyperlipidemia: Secondary | ICD-10-CM | POA: Diagnosis not present

## 2021-02-09 DIAGNOSIS — Z803 Family history of malignant neoplasm of breast: Secondary | ICD-10-CM | POA: Diagnosis not present

## 2021-02-09 DIAGNOSIS — Z79899 Other long term (current) drug therapy: Secondary | ICD-10-CM | POA: Diagnosis not present

## 2021-02-09 DIAGNOSIS — G4733 Obstructive sleep apnea (adult) (pediatric): Secondary | ICD-10-CM | POA: Insufficient documentation

## 2021-02-09 DIAGNOSIS — D509 Iron deficiency anemia, unspecified: Secondary | ICD-10-CM | POA: Diagnosis not present

## 2021-02-09 DIAGNOSIS — G35 Multiple sclerosis: Secondary | ICD-10-CM | POA: Diagnosis not present

## 2021-02-09 DIAGNOSIS — Z9189 Other specified personal risk factors, not elsewhere classified: Secondary | ICD-10-CM | POA: Diagnosis not present

## 2021-02-09 DIAGNOSIS — Z7982 Long term (current) use of aspirin: Secondary | ICD-10-CM | POA: Insufficient documentation

## 2021-02-09 LAB — CBC WITH DIFFERENTIAL/PLATELET
Abs Immature Granulocytes: 0.01 10*3/uL (ref 0.00–0.07)
Basophils Absolute: 0 10*3/uL (ref 0.0–0.1)
Basophils Relative: 0 %
Eosinophils Absolute: 0 10*3/uL (ref 0.0–0.5)
Eosinophils Relative: 0 %
HCT: 38.8 % (ref 36.0–46.0)
Hemoglobin: 13 g/dL (ref 12.0–15.0)
Immature Granulocytes: 0 %
Lymphocytes Relative: 18 %
Lymphs Abs: 1.3 10*3/uL (ref 0.7–4.0)
MCH: 31.9 pg (ref 26.0–34.0)
MCHC: 33.5 g/dL (ref 30.0–36.0)
MCV: 95.1 fL (ref 80.0–100.0)
Monocytes Absolute: 0.4 10*3/uL (ref 0.1–1.0)
Monocytes Relative: 6 %
Neutro Abs: 5.2 10*3/uL (ref 1.7–7.7)
Neutrophils Relative %: 76 %
Platelets: 262 10*3/uL (ref 150–400)
RBC: 4.08 MIL/uL (ref 3.87–5.11)
RDW: 12.1 % (ref 11.5–15.5)
WBC: 7 10*3/uL (ref 4.0–10.5)
nRBC: 0 % (ref 0.0–0.2)

## 2021-02-09 NOTE — Assessment & Plan Note (Signed)
She has significant family history of breast cancer.  Mom had bilateral breast cancer and passed away at 23 from metastatic breast cancer.  Her sister also had breast cancer at the age of 71 and was tested positive for BRCA2 gene and PAL B2 gene, had double mastectomy.  Her sister's daughter also was found to have breast cancer at the age of 70 and tested positive for PAL B2 gene.  Father died of prostate cancer in his 49s.  Father's brother also had prostate cancer.  Paternal grandmother had ovarian cancer.  Given the significant family history, we have calculated her lifetime risk of breast cancer which exceeds 20% hence have recommended annual MRIs along with mammograms.  We have discussed about increased sensitivity of MRIs of breast screening tool, occasional need for increased biopsies and unknown long-term risks of gadolinium.  She is agreeable. Have also recommended genetic testing given BRCA2 and her sister and PAL B2 gene, referral placed.

## 2021-02-09 NOTE — Progress Notes (Signed)
[facility] NOTE  Patient Care Team: Mosie Lukes, MD as PCP - General (Family Medicine) Dorothy Spark, MD (Inactive) as PCP - Cardiology (Cardiology) [place], Jennefer Bravo, MD as Referring Physician (Dermatology) Brand Males, MD as Consulting Physician (Pulmonary Disease) Dorothy Spark, MD (Inactive) as Consulting Physician (Cardiology) Dian Queen, MD as Consulting Physician (Obstetrics and Gynecology) Melissa Montane, MD as Consulting Physician (Otolaryngology) Pyrtle, Lajuan Lines, MD as Consulting Physician (Gastroenterology) Pieter Partridge, DO as Consulting Physician (Neurology)  CHIEF COMPLAINTS/PURPOSE OF CONSULTATION:  Iron deficiency  ASSESSMENT & PLAN:   Iron deficiency anemia This is a pleasant 65 year old female patient who was initially seen for iron deficiency secondary to blood loss from GI tract status post intravenous iron infusion who is here for follow-up.  She tolerated iron infusion very well but could not really tell if there was a significant difference in her energy level after the iron infusion.  No interim hematochezia or melena.  We will plan to repeat labs today CBC, iron panel and ferritin and follow-up on her iron needs and anemia.  She had endoscopy and colonoscopy last year and follows up with gastroenterology.  Family history of breast cancer She has significant family history of breast cancer.  Mom had bilateral breast cancer and passed away at 73 from metastatic breast cancer.  Her sister also had breast cancer at the age of 84 and was tested positive for BRCA2 gene and PAL B2 gene, had double mastectomy.  Her sister's daughter also was found to have breast cancer at the age of 64 and tested positive for PAL B2 gene.  Father died of prostate cancer in his 64s.  Father's brother also had prostate cancer.  Paternal grandmother had ovarian cancer.  Given the significant family history, we have calculated her lifetime risk of  breast cancer which exceeds 20% hence have recommended annual MRIs along with mammograms.  We have discussed about increased sensitivity of MRIs of breast screening tool, occasional need for increased biopsies and unknown long-term risks of gadolinium.  She is agreeable. Have also recommended genetic testing given BRCA2 and her sister and PAL B2 gene, referral placed.  We have not discussed about chemoprevention today, patient is very overwhelmed and does not even know if she will proceed with bilateral mastectomy even if she has BRCA 2 and possibly 2 gene.  We will certainly address this on her next visit if she qualifies for it.  HISTORY OF PRESENTING ILLNESS:   Victoria [name] 65 y.o. female is here because of Iron deficiency.  Interval history  Victoria [name] is here for follow up. She had iron infusions done in April 2022. She couldn't tell much of a difference even after she had iron infusion.  No interim bloody bowel movements since last visit. She says she has become a bit moody and stressed, wonders if this is related to gabapentin which was prescribed for some neck pain and back pain She says her sister has BRCA 2 and PALB2 gene, but she didn't get the genetic testing done yet.  Rest of the pertinent 10 point ROS reviewed and negative.  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 LHC 6/16:  oOM1 60, pRCA 25 >> med Rx b.  cath 3/19 2nd OM with 95% stenosis s/p synergy DES & anomalous RCA   CAP (community acquired pneumonia) 2016; 06/07/2016   Chronic bronchitis ([place])    Chronic lower back pain    Complication of anesthesia    "think I have a hard time waking up from it"   COPD (chronic obstructive pulmonary disease) ([place])    Depression    mild   Diverticulitis    Diverticulosis     GERD (gastroesophageal reflux disease)    H/O hiatal hernia    Headache    "weekly" (08/23/2017)   History of echocardiogram    Echo 6/16:  Mod LVH, EF 60-65%, no RWMA, Gr 1 DD, trivial MR, normal LA size.   Hyperglycemia 11/20/2015   Hyperlipidemia, mixed 09/11/2007   Qualifier: Diagnosis of  By: Jerold Coombe   Did not tolerate Lipitor, zocor, Lovastatin, Pravastatin, Livalo, Crestor even low dose    IBS (irritable bowel syndrome)    Maxillary sinusitis    Normal cardiac stress test 11/2011   No evidence of ischemia or infarct.   Calculated ejection fraction 72%.   Obesity    OSA (obstructive sleep apnea) 02/2012   has stopped using  cpap   Osteoarthritis    Osteoporosis    Pneumonia 11/2011   "before 2013 I hadn't had pneumonia since I was a child" (04/13/2012)   Pulmonary nodules    S/P angioplasty with stent 08/23/17 ostial 2nd OM with DES synnergy 08/24/2017   Schatzki's ring     SURGICAL HISTORY: Past Surgical History:  Procedure Laterality Date   [place] N/A 11/25/2014   Procedure: Right/Left Heart Cath and Coronary Angiography;  Surgeon: Belva Crome, MD;  Location: [facility];  Service: Cardiovascular;  Laterality: N/A;   [place] WITH STENT PLACEMENT  08/23/2017   CORONARY STENT INTERVENTION N/A 08/23/2017   Procedure: CORONARY STENT INTERVENTION;  Surgeon: Burnell Blanks, MD;  Location: [facility];  Service: Cardiovascular;  Laterality: N/A;   HERNIA REPAIR  04/13/2012   VHR laparoscopic   LEFT HEART CATH AND CORONARY ANGIOGRAPHY N/A 08/23/2017   Procedure: LEFT HEART CATH AND CORONARY ANGIOGRAPHY;  Surgeon: Burnell Blanks, MD;  Location: [facility];  Service: Cardiovascular;  Laterality: N/A;   VENTRAL HERNIA REPAIR  04/13/2012   Procedure: LAPAROSCOPIC VENTRAL HERNIA;  Surgeon: Adin Hector, MD;  Location: [facility];  Service: General;  Laterality: N/A;   laparoscopic repair of incarcerated hernia    SOCIAL HISTORY: Social History   Socioeconomic History   Marital status: Married    Spouse name: Not on file   Number of children: 1   Years of education: 14   Highest education level: Associate degree: academic program  Occupational History   Occupation: Disabled   Tobacco Use   Smoking status: Never   Smokeless tobacco: Never  Scientific laboratory technician Use: Never used  Substance and Sexual Activity   Alcohol use: Yes    Comment: social use   Drug use: No   Sexual activity: Yes    Comment: gluten free, lives with husband and son with CP quadriplegia  Other Topics Concern   Not on file  Social History Narrative   Cares for son with cerebral palsy.    Lives at home with her husband and son.   Right-handed.   2 cups caffeine per day.   One story home  Social Determinants of Health   Financial Resource Strain: Not on file  Food Insecurity: Not on file  Transportation Needs: Not on file  Physical Activity: Not on file  Stress: Not on file  Social Connections: Not on file  Intimate Partner Violence: Not on file    FAMILY HISTORY: Family History  Problem Relation Age of Onset   Breast cancer Mother    Hypertension Mother    Diabetes Mother    Cancer Mother        recurrent, metastatic breast cancer.    Diverticulosis Father    Prostate cancer Father    Pulmonary embolism Brother        recurrent   Heart attack Maternal Grandfather    Breast cancer Sister    Cancer Sister        breast cancer, invasive ductal carcinoma in 2022,DCIS at 89 with 4 weeks of radiation, 5 years of Tamoxifen    Stroke Neg Hx    Colon cancer Neg Hx    Esophageal cancer Neg Hx    Stomach cancer Neg Hx    Rectal cancer Neg Hx     ALLERGIES:  is allergic to beclomethasone dipropionate, flexeril [cyclobenzaprine], mometasone furo-formoterol fum, sulfonamide derivatives, and statins.  MEDICATIONS:  Current Outpatient Medications  Medication  Sig Dispense Refill   acyclovir ointment (ZOVIRAX) 5 % Apply 1 application topically every 3 (three) hours. 15 g 2   aspirin 81 MG tablet Take 81 mg by mouth daily.     Benralizumab (FASENRA PEN) 30 MG/ML SOAJ Inject 30 mg into the skin every 8 (eight) weeks. 1 mL 5   Bioflavonoid Products (ESTER C PO) Take 500 mg by mouth daily.     Calcium-Magnesium-Vitamin D 300-150-400 MG-MG-UNIT TABS Take 1 tablet by mouth daily.     chlorpheniramine-HYDROcodone (TUSSIONEX PENNKINETIC ER) 10-8 MG/5ML SUER Take 5 mLs by mouth every 12 (twelve) hours as needed for cough. 140 mL 0   denosumab (PROLIA) 60 MG/ML SOSY injection Inject 60 mg into the skin every 6 (six) months.     famotidine (PEPCID) 20 MG tablet TAKE 1 TABLET (20 MG TOTAL) BY MOUTH AT BEDTIME. 30 tablet 3   LORazepam (ATIVAN) 1 MG tablet TAKE 1/2 TO 1 TABLET BY MOUTH 2 TIMES A DAY AS NEEDED FOR ANXIETY OR SLEEP 30 tablet 2   MAGNESIUM GLYCINATE PO Take 400 mg by mouth daily.     nitroGLYCERIN (NITROSTAT) 0.4 MG SL tablet PLACE 1 TABLET UNDER THE TONGUE EVERY 5 MINUTES AS NEEDED FOR CHEST PAIN. 25 tablet 4   REPATHA SURECLICK 332 MG/ML SOAJ INJECT 1 PEN INTO THE SKIN EVERY 14 DAYS 6 mL 3   venlafaxine XR (EFFEXOR-XR) 150 MG 24 hr capsule TAKE 1 CAPSULE BY MOUTH DAILY WITH BREAKFAST. 90 capsule 1   VITAMIN D PO Take 5,000 Units by mouth daily.     ZINC CITRATE PO Take 30 mg by mouth daily.     gabapentin (NEURONTIN) 100 MG capsule Take 100 mg by mouth 3 (three) times daily as needed. (Patient not taking: Reported on 02/09/2021)     No current facility-administered medications for this visit.     PHYSICAL EXAMINATION:  ECOG PERFORMANCE STATUS: 1 - Symptomatic but completely ambulatory  Vitals:   02/09/21 1324  BP: (!) 121/58  Pulse: 84  Resp: 18  Temp: 98.4 F (36.9 C)  SpO2: 97%   Filed Weights   02/09/21 1324  Weight: 156 lb 8 oz (71 kg)   Physical Exam  Constitutional:      Appearance: Normal appearance.  HENT:     Head:  Normocephalic and atraumatic.  Cardiovascular:     Rate and Rhythm: Normal rate and regular rhythm.     Pulses: Normal pulses.     Heart sounds: Normal heart sounds.  Pulmonary:     Effort: Pulmonary effort is normal.     Breath sounds: Normal breath sounds.  Abdominal:     General: Abdomen is flat.     Palpations: Abdomen is soft.  Musculoskeletal:        General: No swelling or tenderness.     Cervical back: Normal range of motion and neck supple. No rigidity.  Lymphadenopathy:     Cervical: No cervical adenopathy.  Skin:    General: Skin is warm and dry.  Neurological:     General: No focal deficit present.     Mental Status: She is alert.  Psychiatric:        Mood and Affect: Mood normal.   Breast exam deferred, patient recently had breast exam, last week also had a mammogram  LABORATORY DATA:  I have reviewed the data as listed Lab Results  Component Value Date   WBC 7.0 02/09/2021   HGB 13.0 02/09/2021   HCT 38.8 02/09/2021   MCV 95.1 02/09/2021   PLT 262 02/09/2021     Chemistry      Component Value Date/Time   NA 137 09/29/2020 1138   NA 143 08/21/2017 1412   K 3.8 09/29/2020 1138   CL 102 09/29/2020 1138   CO2 27 09/29/2020 1138   BUN 15 09/29/2020 1138   BUN 13 08/21/2017 1412   CREATININE 0.61 09/29/2020 1138      Component Value Date/Time   CALCIUM 9.0 09/29/2020 1138   ALKPHOS 73 09/29/2020 1138   AST 21 09/29/2020 1138   ALT 22 09/29/2020 1138   BILITOT 0.3 09/29/2020 1138     I reviewed her most recent labs.  No evidence of anemia however she does have severe iron deficiency with a ferritin of 7  RADIOGRAPHIC STUDIES: I have personally reviewed the radiological images as listed and agreed with the findings in the report. No results found.  All questions were answered. The patient knows to call the clinic with any problems, questions or concerns. I spent 30 minutes in the care of this patient including H and P, review of records, counseling  and coordination of care. We have discussed about follow-up on iron deficiency anemia, importance of genetic testing given her family history, annual MRIs along with mammograms given her lifetime risk of breast cancer and follow-up with [name] in high risk breast clinic.    Benay Pike, MD 02/09/2021 3:45 PM

## 2021-02-09 NOTE — Assessment & Plan Note (Signed)
This is a pleasant 65 year old female patient who was initially seen for iron deficiency secondary to blood loss from GI tract status post intravenous iron infusion who is here for follow-up.  She tolerated iron infusion very well but could not really tell if there was a significant difference in her energy level after the iron infusion.  No interim hematochezia or melena.  We will plan to repeat labs today CBC, iron panel and ferritin and follow-up on her iron needs and anemia.  She had endoscopy and colonoscopy last year and follows up with gastroenterology.

## 2021-02-10 ENCOUNTER — Telehealth: Payer: Self-pay | Admitting: Family Medicine

## 2021-02-10 LAB — IRON AND TIBC
Iron: 61 ug/dL (ref 41–142)
Saturation Ratios: 25 % (ref 21–57)
TIBC: 246 ug/dL (ref 236–444)
UIBC: 185 ug/dL (ref 120–384)

## 2021-02-10 LAB — FERRITIN: Ferritin: 457 ng/mL — ABNORMAL HIGH (ref 11–307)

## 2021-02-10 MED ORDER — VENLAFAXINE HCL ER 75 MG PO CP24: 75.0000 mg | ORAL_CAPSULE | Freq: Two times a day (BID) | ORAL | 3 refills | Status: DC

## 2021-02-10 NOTE — Telephone Encounter (Signed)
Patient states her depression and anxiety have gotten worse, she wanted to move up her appointment but unable to do so due to blyth's schedule. Patient would now like to know if she could up her dose of venlafaxine XR (EFFEXOR-XR) 150 MG 24 hr capsule  . She states she's been having a hard time and nothing is improving. Please advice.

## 2021-02-10 NOTE — Telephone Encounter (Signed)
Medication sent waiting on a date to schedule pt

## 2021-02-11 ENCOUNTER — Telehealth (INDEPENDENT_AMBULATORY_CARE_PROVIDER_SITE_OTHER): Payer: PPO | Admitting: Family Medicine

## 2021-02-11 ENCOUNTER — Encounter: Payer: Self-pay | Admitting: Family Medicine

## 2021-02-11 DIAGNOSIS — R739 Hyperglycemia, unspecified: Secondary | ICD-10-CM

## 2021-02-11 DIAGNOSIS — F418 Other specified anxiety disorders: Secondary | ICD-10-CM | POA: Diagnosis not present

## 2021-02-11 DIAGNOSIS — M542 Cervicalgia: Secondary | ICD-10-CM | POA: Diagnosis not present

## 2021-02-11 DIAGNOSIS — T466X5A Adverse effect of antihyperlipidemic and antiarteriosclerotic drugs, initial encounter: Secondary | ICD-10-CM | POA: Diagnosis not present

## 2021-02-11 DIAGNOSIS — G720 Drug-induced myopathy: Secondary | ICD-10-CM

## 2021-02-11 MED ORDER — VENLAFAXINE HCL ER 37.5 MG PO CP24: 37.5000 mg | ORAL_CAPSULE | Freq: Every day | ORAL | 1 refills | Status: DC

## 2021-02-11 MED ORDER — VENLAFAXINE HCL ER 150 MG PO CP24: 150.0000 mg | ORAL_CAPSULE | Freq: Every day | ORAL | 0 refills | Status: DC

## 2021-02-11 NOTE — Telephone Encounter (Signed)
scheduled

## 2021-02-11 NOTE — Progress Notes (Signed)
MyChart Video Visit    Virtual Visit via Video Note   This visit type was conducted due to national recommendations for restrictions regarding the COVID-19 Pandemic (e.g. social distancing) in an effort to limit this patient's exposure and mitigate transmission in our community. This patient is at least at moderate risk for complications without adequate follow up. This format is felt to be most appropriate for this patient at this time. Physical exam was limited by quality of the video and audio technology used for the visit. S Chism, CMA was able to get the patient set up on a video visit.  Patient location: home Patient and provider in visit Provider location: Office  I discussed the limitations of evaluation and management by telemedicine and the availability of in person appointments. The patient expressed understanding and agreed to proceed.  Visit Date: 02/11/2021  Today's healthcare provider: [name], MD     Subjective:    Patient ID: Victoria [name], female    DOB: [birth year], 65 y.o.   MRN: [id number]  No chief complaint on file.   HPI  Patient is in today for a video visit for follow up on medication management and acid reflux. She is currently experiencing difficulties at work that are increasing her anxiety. She has attempted to take her muscle relaxer, but she states it caused her more anxiety. As a result, she has been off it now for 2 nights and has noticed that she is feeling better and less anxious. Denies CP/palp/SOB/HA/congestion/fevers/GI or GU c/o. Taking meds as prescribed.   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 LHC 6/16:  oOM1 60, pRCA 25 >> med Rx b. cath 3/19 2nd OM with 95% stenosis s/p synergy DES & anomalous RCA   CAP (community acquired pneumonia)  2016; 06/07/2016   Chronic bronchitis (HCC)    Chronic lower back pain    Complication of anesthesia    "think I have a hard time waking up from it"   COPD (chronic obstructive pulmonary disease) ([place])    Depression    mild   Diverticulitis    Diverticulosis    GERD (gastroesophageal reflux disease)    H/O hiatal hernia    Headache    "weekly" (08/23/2017)   History of echocardiogram    Echo 6/16:  Mod LVH, EF 60-65%, no RWMA, Gr 1 DD, trivial MR, normal LA size.   Hyperglycemia 11/20/2015   Hyperlipidemia, mixed 09/11/2007   Qualifier: Diagnosis of  By: Jerold Coombe   Did not tolerate Lipitor, zocor, Lovastatin, Pravastatin, Livalo, Crestor even low dose    IBS (irritable bowel syndrome)    Maxillary sinusitis    Normal cardiac stress test 11/2011   No evidence of ischemia or infarct.   Calculated ejection fraction 72%.   Obesity    OSA (obstructive sleep apnea) 02/2012   has stopped using  cpap   Osteoarthritis    Osteoporosis    Pneumonia 11/2011   "before 2013 I hadn't had pneumonia since I was a child" (04/13/2012)   Pulmonary nodules    S/P angioplasty with stent 08/23/17 ostial 2nd OM with DES synnergy 08/24/2017   Schatzki's ring     Past Surgical History:  Procedure Laterality Date   APPENDECTOMY  1989  CARDIAC CATHETERIZATION N/A 11/25/2014   Procedure: Right/Left Heart Cath and Coronary Angiography;  Surgeon: Belva Crome, MD;  Location: [facility];  Service: Cardiovascular;  Laterality: N/A;   [place] WITH STENT PLACEMENT  08/23/2017   CORONARY STENT INTERVENTION N/A 08/23/2017   Procedure: CORONARY STENT INTERVENTION;  Surgeon: Burnell Blanks, MD;  Location: [facility];  Service: Cardiovascular;  Laterality: N/A;   HERNIA REPAIR  04/13/2012   VHR laparoscopic   LEFT HEART CATH AND CORONARY ANGIOGRAPHY N/A 08/23/2017   Procedure: LEFT HEART CATH AND CORONARY ANGIOGRAPHY;  Surgeon: Burnell Blanks,  MD;  Location: [facility];  Service: Cardiovascular;  Laterality: N/A;   VENTRAL HERNIA REPAIR  04/13/2012   Procedure: LAPAROSCOPIC VENTRAL HERNIA;  Surgeon: Adin Hector, MD;  Location: [facility];  Service: General;  Laterality: N/A;  laparoscopic repair of incarcerated hernia    Family History  Problem Relation Age of Onset   Breast cancer Mother    Hypertension Mother    Diabetes Mother    Cancer Mother        recurrent, metastatic breast cancer.    Diverticulosis Father    Prostate cancer Father    Pulmonary embolism Brother        recurrent   Heart attack Maternal Grandfather    Breast cancer Sister    Cancer Sister        breast cancer, invasive ductal carcinoma in 2022,DCIS at 6 with 4 weeks of radiation, 5 years of Tamoxifen    Stroke Neg Hx    Colon cancer Neg Hx    Esophageal cancer Neg Hx    Stomach cancer Neg Hx    Rectal cancer Neg Hx     Social History   Socioeconomic History   Marital status: Married    Spouse name: Not on file   Number of children: 1   Years of education: 14   Highest education level: Associate degree: academic program  Occupational History   Occupation: Disabled   Tobacco Use   Smoking status: Never   Smokeless tobacco: Never  Scientific laboratory technician Use: Never used  Substance and Sexual Activity   Alcohol use: Yes    Comment: social use   Drug use: No   Sexual activity: Yes    Comment: gluten free, lives with husband and son with CP quadriplegia  Other Topics Concern   Not on file  Social History Narrative   Cares for son with cerebral palsy.    Lives at home with her husband and son.   Right-handed.   2 cups caffeine per day.   One story home   Social Determinants of Health   Financial Resource Strain: Not on file  Food Insecurity: Not on file  Transportation Needs: Not on file  Physical Activity: Not on file  Stress: Not on file  Social Connections: Not on file  Intimate Partner Violence: Not on file     Outpatient Medications Prior to Visit  Medication Sig Dispense Refill   acyclovir ointment (ZOVIRAX) 5 % Apply 1 application topically every 3 (three) hours. 15 g 2   aspirin 81 MG tablet Take 81 mg by mouth daily.     Benralizumab (FASENRA PEN) 30 MG/ML SOAJ Inject 30 mg into the skin every 8 (eight) weeks. 1 mL 5   Bioflavonoid Products (ESTER C PO) Take 500 mg by mouth daily.     Calcium-Magnesium-Vitamin D  300-150-400 MG-MG-UNIT TABS Take 1 tablet by mouth daily.     denosumab (PROLIA) 60 MG/ML SOSY injection Inject 60 mg into the skin every 6 (six) months.     famotidine (PEPCID) 20 MG tablet TAKE 1 TABLET (20 MG TOTAL) BY MOUTH AT BEDTIME. 30 tablet 3   gabapentin (NEURONTIN) 100 MG capsule Take 100 mg by mouth 3 (three) times daily as needed. (Patient not taking: Reported on 02/09/2021)     LORazepam (ATIVAN) 1 MG tablet TAKE 1/2 TO 1 TABLET BY MOUTH 2 TIMES A DAY AS NEEDED FOR ANXIETY OR SLEEP 30 tablet 2   MAGNESIUM GLYCINATE PO Take 400 mg by mouth daily.     nitroGLYCERIN (NITROSTAT) 0.4 MG SL tablet PLACE 1 TABLET UNDER THE TONGUE EVERY 5 MINUTES AS NEEDED FOR CHEST PAIN. 25 tablet 4   REPATHA SURECLICK XX123456 MG/ML SOAJ INJECT 1 PEN INTO THE SKIN EVERY 14 DAYS 6 mL 3   VITAMIN D PO Take 5,000 Units by mouth daily.     ZINC CITRATE PO Take 30 mg by mouth daily.     chlorpheniramine-HYDROcodone (TUSSIONEX PENNKINETIC ER) 10-8 MG/5ML SUER Take 5 mLs by mouth every 12 (twelve) hours as needed for cough. 140 mL 0   venlafaxine XR (EFFEXOR-XR) 75 MG 24 hr capsule Take 1 capsule (75 mg total) by mouth 2 (two) times daily. 60 capsule 3   No facility-administered medications prior to visit.    Allergies  Allergen Reactions   Beclomethasone Dipropionate Hives and Other (See Comments)     weight gain   Flexeril [Cyclobenzaprine] Anxiety   Mometasone Furo-Formoterol Fum Hives and Other (See Comments)    weight gain   Sulfonamide Derivatives Hives and Rash   Statins     Myalgias, RLS     Review of Systems  Constitutional:  Positive for malaise/fatigue. Negative for chills and fever.  HENT:  Negative for congestion, sinus pain and sore throat.   Eyes:  Negative for blurred vision.  Respiratory:  Negative for cough and shortness of breath.   Cardiovascular:  Negative for chest pain, palpitations and leg swelling.  Gastrointestinal:  Negative for blood in stool, diarrhea, nausea and vomiting.  Genitourinary:  Negative for flank pain and frequency.  Musculoskeletal:  Positive for back pain, myalgias and neck pain.  Skin:  Negative for rash.  Neurological:  Negative for headaches.  Psychiatric/Behavioral:  Positive for depression. The patient is nervous/anxious.       Objective:    Physical Exam Constitutional:      Appearance: Normal appearance.  HENT:     Head: Normocephalic and atraumatic.     Right Ear: External ear normal.     Left Ear: External ear normal.  Pulmonary:     Effort: Pulmonary effort is normal.  Musculoskeletal:        General: Normal range of motion.     Cervical back: Normal range of motion.  Skin:    General: Skin is dry.  Neurological:     Mental Status: She is alert and oriented to person, place, and time.  Psychiatric:        Behavior: Behavior normal.    There were no vitals taken for this visit. Wt Readings from Last 3 Encounters:  02/09/21 156 lb 8 oz (71 kg)  11/11/20 154 lb 3.2 oz (69.9 kg)  09/21/20 156 lb 3.2 oz (70.9 kg)    Diabetic Foot Exam - Simple   No data filed    Lab Results  Component  Value Date   WBC 7.0 02/09/2021   HGB 13.0 02/09/2021   HCT 38.8 02/09/2021   PLT 262 02/09/2021   GLUCOSE 86 09/29/2020   CHOL 166 06/30/2020   TRIG 92.0 06/30/2020   HDL 62.80 06/30/2020   LDLDIRECT 177.8 09/07/2011   LDLCALC 85 06/30/2020   ALT 22 09/29/2020   AST 21 09/29/2020   NA 137 09/29/2020   K 3.8 09/29/2020   CL 102 09/29/2020   CREATININE 0.61 09/29/2020   BUN 15 09/29/2020   CO2 27 09/29/2020   TSH  2.12 06/30/2020   INR 1.0 08/21/2017   HGBA1C 5.5 06/30/2020    Lab Results  Component Value Date   TSH 2.12 06/30/2020   Lab Results  Component Value Date   WBC 7.0 02/09/2021   HGB 13.0 02/09/2021   HCT 38.8 02/09/2021   MCV 95.1 02/09/2021   PLT 262 02/09/2021   Lab Results  Component Value Date   NA 137 09/29/2020   K 3.8 09/29/2020   CO2 27 09/29/2020   GLUCOSE 86 09/29/2020   BUN 15 09/29/2020   CREATININE 0.61 09/29/2020   BILITOT 0.3 09/29/2020   ALKPHOS 73 09/29/2020   AST 21 09/29/2020   ALT 22 09/29/2020   PROT 7.1 09/29/2020   ALBUMIN 4.2 09/29/2020   CALCIUM 9.0 09/29/2020   ANIONGAP 6 08/24/2017   GFR 95.16 09/29/2020   Lab Results  Component Value Date   CHOL 166 06/30/2020   Lab Results  Component Value Date   HDL 62.80 06/30/2020   Lab Results  Component Value Date   LDLCALC 85 06/30/2020   Lab Results  Component Value Date   TRIG 92.0 06/30/2020   Lab Results  Component Value Date   CHOLHDL 3 06/30/2020   Lab Results  Component Value Date   HGBA1C 5.5 06/30/2020       Assessment & Plan:   Problem List Items Addressed This Visit   None    Meds ordered this encounter  Medications   venlafaxine XR (EFFEXOR XR) 37.5 MG 24 hr capsule    Sig: Take 1 capsule (37.5 mg total) by mouth daily with breakfast.    Dispense:  30 capsule    Refill:  1   venlafaxine XR (EFFEXOR-XR) 150 MG 24 hr capsule    Sig: Take 1 capsule (150 mg total) by mouth daily with breakfast. Take with 37.5 mg cap daily    Dispense:  90 capsule    Refill:  0    I discussed the assessment and treatment plan with the patient. The patient was provided an opportunity to ask questions and all were answered. The patient agreed with the plan and demonstrated an understanding of the instructions.   The patient was advised to call back or seek an in-person evaluation if the symptoms worsen or if the condition fails to improve as anticipated.  I provided 25 minutes  of face-to-face time during this encounter.   [name], MD [facility] at [facility] [phone] (phone) [phone] (fax)  [place], Suezanne Jacquet, acting as a scribe for [name], MD, have documented all relevent documentation on behalf of [name], MD, as directed by [name], MD while in the presence of [name], MD.  I, Mosie Lukes, MD personally performed the services described in this documentation. All medical record entries made by the scribe were at my direction and in my presence. I have reviewed the  chart and agree that the record reflects my personal performance and is accurate and complete

## 2021-02-12 NOTE — Assessment & Plan Note (Signed)
Has been increased due to stress and she tried using Gabapentin but feels it has caused her increased irritability, nausea, and crying.  Is improving since stopping it 3 nights ago.
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
## 2021-02-12 NOTE — Assessment & Plan Note (Signed)
Patient does not tolerated Statins

## 2021-02-12 NOTE — Assessment & Plan Note (Signed)
hgba1c acceptable, minimize simple carbs. Increase exercise as tolerated.  

## 2021-02-16 DIAGNOSIS — M545 Low back pain, unspecified: Secondary | ICD-10-CM | POA: Diagnosis not present

## 2021-02-16 DIAGNOSIS — M542 Cervicalgia: Secondary | ICD-10-CM | POA: Diagnosis not present

## 2021-03-03 NOTE — Progress Notes (Signed)
Subjective:   By signing my name below, I, [name], attest that this documentation has been prepared under the direction and in the presence of [name] 03/04/2021   Patient ID: Victoria [name], female    DOB: April 26, 1956, 65 y.o.   MRN: [id number]  Chief Complaint  Patient presents with   Follow-up    Pt states that s    HPI Patient with hx of anxiety, osteoporosis, CAD, and hyperlipidemia is presented today for an office visit and other chronic medical concerns. Today, she experiences anxiety due to her son-who has cerebral palsy-  dealing with computer work and feels like overloading which overwhelms her. She also reports back pain and includes her husband helps her with house chores to help reduce pain. She states with her bowel movements for the last week, she experiences tenderness. No fever or chills, no blackness or sticky stool or blood in stool.She states she can't fall asleep on her own and feels like Lorazepam 1 mg is not effective. She recently increased Gabapentin 100 mg, 3 times as needed however made things worse for her so settled to 1 time a night. She has been taking [employer]: Serotonin Dopamine Liquescence under her tongue routinely to help her but recently did read about serotonin overloads and slowed down on the use. She considered to get the Paxlovid pill for COVID-19.   Mammogram completed 02/03/2020 and PAP SMEAR completed 02/04/2020 which was normal. Also colonoscopy completed 11/19/2019 and hemorrhoids were found however the exam was without abnormality. No polypoid lesions found.   Patient denies chest pains, shortness of breath, palpitations, chest tightness, blood in stool, dizziness, fatigue, headaches, syncope, GU or GI c/o.  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 LHC 6/16:  oOM1 60, pRCA 25 >> med Rx b. cath 3/19 2nd OM with 95% stenosis s/p synergy DES & anomalous RCA   CAP (community acquired pneumonia) 2016; 06/07/2016   Chronic bronchitis (HCC)    Chronic lower back pain    Complication of anesthesia    "think I have a hard time waking up from it"   COPD (chronic obstructive pulmonary disease) ([place])    Depression    mild   Diverticulitis    Diverticulosis    GERD (gastroesophageal reflux disease)    H/O hiatal hernia    Headache    "weekly" (08/23/2017)   History of echocardiogram    Echo 6/16:  Mod LVH, EF 60-65%, no RWMA, Gr 1 DD, trivial MR, normal LA size.   Hyperglycemia 11/20/2015   Hyperlipidemia, mixed 09/11/2007   Qualifier: Diagnosis of  By: Jerold Coombe   Did not tolerate Lipitor, zocor, Lovastatin, Pravastatin, Livalo, Crestor even low dose    IBS (irritable bowel syndrome)    Maxillary sinusitis    Normal cardiac stress test 11/2011   No evidence of ischemia or infarct.   Calculated ejection fraction 72%.   Obesity    OSA (obstructive sleep apnea) 02/2012   has stopped using  cpap   Osteoarthritis    Osteoporosis    Pneumonia 11/2011   "before 2013 I hadn't had pneumonia since I was a child" (04/13/2012)   Pulmonary nodules    S/P angioplasty with stent 08/23/17 ostial 2nd OM with  DES synnergy 08/24/2017   Schatzki's ring     Past Surgical History:  Procedure Laterality Date   APPENDECTOMY  1989   CARDIAC CATHETERIZATION N/A 11/25/2014   Procedure: Right/Left Heart Cath and Coronary Angiography;  Surgeon: Belva Crome, MD;  Location: [facility];  Service: Cardiovascular;  Laterality: N/A;   [place] WITH STENT PLACEMENT  08/23/2017   CORONARY STENT INTERVENTION N/A 08/23/2017   Procedure: CORONARY STENT INTERVENTION;  Surgeon: Burnell Blanks, MD;  Location: [facility];  Service: Cardiovascular;  Laterality: N/A;   HERNIA REPAIR  04/13/2012    VHR laparoscopic   LEFT HEART CATH AND CORONARY ANGIOGRAPHY N/A 08/23/2017   Procedure: LEFT HEART CATH AND CORONARY ANGIOGRAPHY;  Surgeon: Burnell Blanks, MD;  Location: [facility];  Service: Cardiovascular;  Laterality: N/A;   VENTRAL HERNIA REPAIR  04/13/2012   Procedure: LAPAROSCOPIC VENTRAL HERNIA;  Surgeon: Adin Hector, MD;  Location: [facility];  Service: General;  Laterality: N/A;  laparoscopic repair of incarcerated hernia    Family History  Problem Relation Age of Onset   Breast cancer Mother    Hypertension Mother    Diabetes Mother    Cancer Mother        recurrent, metastatic breast cancer.    Diverticulosis Father    Prostate cancer Father    Pulmonary embolism Brother        recurrent   Heart attack Maternal Grandfather    Breast cancer Sister    Cancer Sister        breast cancer, invasive ductal carcinoma in 2022,DCIS at 72 with 4 weeks of radiation, 5 years of Tamoxifen    Stroke Neg Hx    Colon cancer Neg Hx    Esophageal cancer Neg Hx    Stomach cancer Neg Hx    Rectal cancer Neg Hx     Social History   Socioeconomic History   Marital status: Married    Spouse name: Not on file   Number of children: 1   Years of education: 14   Highest education level: Associate degree: academic program  Occupational History   Occupation: Disabled   Tobacco Use   Smoking status: Never   Smokeless tobacco: Never  Scientific laboratory technician Use: Never used  Substance and Sexual Activity   Alcohol use: Yes    Comment: social use   Drug use: No   Sexual activity: Yes    Comment: gluten free, lives with husband and son with CP quadriplegia  Other Topics Concern   Not on file  Social History Narrative   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    Lives at home with her husband and son.   Right-handed.   2 cups caffeine per day.   One story home   Social Determinants of Health   Financial Resource Strain: Not on file  Food Insecurity: Not on file   Transportation Needs: Not on file  Physical Activity: Not on file  Stress: Not on file  Social Connections: Not on file  Intimate Partner Violence: Not on file    Outpatient Medications Prior to Visit  Medication Sig Dispense Refill   acyclovir ointment (ZOVIRAX) 5 % Apply 1 application topically every 3 (three) hours. 15 g 2   aspirin 81 MG tablet Take 81 mg by mouth daily.     Benralizumab (FASENRA PEN) 30 MG/ML SOAJ Inject 30 mg into the skin every  8 (eight) weeks. 1 mL 5   Bioflavonoid Products (ESTER C PO) Take 500 mg by mouth daily.     Calcium-Magnesium-Vitamin D 300-150-400 MG-MG-UNIT TABS Take 1 tablet by mouth daily.     denosumab (PROLIA) 60 MG/ML SOSY injection Inject 60 mg into the skin every 6 (six) months.     famotidine (PEPCID) 20 MG tablet TAKE 1 TABLET (20 MG TOTAL) BY MOUTH AT BEDTIME. 30 tablet 3   LORazepam (ATIVAN) 1 MG tablet TAKE 1/2 TO 1 TABLET BY MOUTH 2 TIMES A DAY AS NEEDED FOR ANXIETY OR SLEEP 30 tablet 2   MAGNESIUM GLYCINATE PO Take 400 mg by mouth daily.     nitroGLYCERIN (NITROSTAT) 0.4 MG SL tablet PLACE 1 TABLET UNDER THE TONGUE EVERY 5 MINUTES AS NEEDED FOR CHEST PAIN. 25 tablet 4   REPATHA SURECLICK XX123456 MG/ML SOAJ INJECT 1 PEN INTO THE SKIN EVERY 14 DAYS 6 mL 3   venlafaxine XR (EFFEXOR XR) 37.5 MG 24 hr capsule Take 1 capsule (37.5 mg total) by mouth daily with breakfast. 30 capsule 1   venlafaxine XR (EFFEXOR-XR) 150 MG 24 hr capsule Take 1 capsule (150 mg total) by mouth daily with breakfast. Take with 37.5 mg cap daily 90 capsule 0   VITAMIN D PO Take 5,000 Units by mouth daily.     ZINC CITRATE PO Take 30 mg by mouth daily.     gabapentin (NEURONTIN) 100 MG capsule Take 100 mg by mouth 3 (three) times daily as needed.     No facility-administered medications prior to visit.    Allergies  Allergen Reactions   Beclomethasone Dipropionate Hives and Other (See Comments)     weight gain   Flexeril [Cyclobenzaprine] Anxiety   Mometasone  Furo-Formoterol Fum Hives and Other (See Comments)    weight gain   Sulfonamide Derivatives Hives and Rash   Statins     Myalgias, RLS    Review of Systems  Constitutional:  Negative for chills, fever and malaise/fatigue.  HENT:  Negative for congestion, hearing loss and sinus pain.   Eyes:  Negative for blurred vision and redness.  Respiratory:  Negative for cough, shortness of breath and wheezing.   Cardiovascular:  Negative for chest pain, palpitations and leg swelling.  Gastrointestinal:  Negative for diarrhea, nausea and vomiting.       Bowel movement tenderness  Genitourinary:  Negative for dysuria, frequency and urgency.  Musculoskeletal:  Positive for back pain. Negative for falls and myalgias.  Neurological:  Negative for dizziness, weakness and headaches.  Psychiatric/Behavioral:  Negative for memory loss. The patient is nervous/anxious. The patient does not have insomnia.       Objective:    Physical Exam Constitutional:      General: She is not in acute distress.    Appearance: Normal appearance. She is not ill-appearing.  HENT:     Head: Normocephalic and atraumatic.     Right Ear: Tympanic membrane and external ear normal.     Left Ear: Tympanic membrane and external ear normal.     Nose: Nose normal.  Eyes:     General: No scleral icterus.    Extraocular Movements: Extraocular movements intact.     Pupils: Pupils are equal, round, and reactive to light.  Cardiovascular:     Rate and Rhythm: Normal rate and regular rhythm.     Pulses: Normal pulses.     Heart sounds: Normal heart sounds. No murmur heard. Pulmonary:     Effort: Pulmonary effort  is normal.     Breath sounds: Normal breath sounds.  Abdominal:     General: Abdomen is flat.     Palpations: Abdomen is soft. There is no mass.     Tenderness: There is no abdominal tenderness. There is no right CVA tenderness or left CVA tenderness.  Musculoskeletal:        General: Normal range of motion.      Cervical back: Normal range of motion and neck supple.     Right lower leg: No edema.     Left lower leg: No edema.  Lymphadenopathy:     Cervical: No cervical adenopathy.  Skin:    General: Skin is warm and dry.  Neurological:     General: No focal deficit present.     Mental Status: She is alert and oriented to person, place, and time.     Deep Tendon Reflexes: Reflexes normal.  Psychiatric:        Mood and Affect: Mood normal.        Behavior: Behavior normal.    There were no vitals taken for this visit. Wt Readings from Last 3 Encounters:  02/09/21 156 lb 8 oz (71 kg)  11/11/20 154 lb 3.2 oz (69.9 kg)  09/21/20 156 lb 3.2 oz (70.9 kg)    Diabetic Foot Exam - Simple   No data filed    Lab Results  Component Value Date   WBC 7.0 02/09/2021   HGB 13.0 02/09/2021   HCT 38.8 02/09/2021   PLT 262 02/09/2021   GLUCOSE 86 09/29/2020   CHOL 166 06/30/2020   TRIG 92.0 06/30/2020   HDL 62.80 06/30/2020   LDLDIRECT 177.8 09/07/2011   LDLCALC 85 06/30/2020   ALT 22 09/29/2020   AST 21 09/29/2020   NA 137 09/29/2020   K 3.8 09/29/2020   CL 102 09/29/2020   CREATININE 0.61 09/29/2020   BUN 15 09/29/2020   CO2 27 09/29/2020   TSH 2.12 06/30/2020   INR 1.0 08/21/2017   HGBA1C 5.5 06/30/2020    Lab Results  Component Value Date   TSH 2.12 06/30/2020   Lab Results  Component Value Date   WBC 7.0 02/09/2021   HGB 13.0 02/09/2021   HCT 38.8 02/09/2021   MCV 95.1 02/09/2021   PLT 262 02/09/2021   Lab Results  Component Value Date   NA 137 09/29/2020   K 3.8 09/29/2020   CO2 27 09/29/2020   GLUCOSE 86 09/29/2020   BUN 15 09/29/2020   CREATININE 0.61 09/29/2020   BILITOT 0.3 09/29/2020   ALKPHOS 73 09/29/2020   AST 21 09/29/2020   ALT 22 09/29/2020   PROT 7.1 09/29/2020   ALBUMIN 4.2 09/29/2020   CALCIUM 9.0 09/29/2020   ANIONGAP 6 08/24/2017   GFR 95.16 09/29/2020   Lab Results  Component Value Date   CHOL 166 06/30/2020   Lab Results  Component  Value Date   HDL 62.80 06/30/2020   Lab Results  Component Value Date   LDLCALC 85 06/30/2020   Lab Results  Component Value Date   TRIG 92.0 06/30/2020   Lab Results  Component Value Date   CHOLHDL 3 06/30/2020   Lab Results  Component Value Date   HGBA1C 5.5 06/30/2020       Assessment & Plan:   Problem List Items Addressed This Visit     Hyperglycemia (Chronic)    Continue to maintain a heart healthy diet and stay active      Depression with  anxiety    She notes her anxiety and ability to juggle many things at once is worsening. She denies any concerns about her overall memory. She has started a homeopathic preparation to support anxiety and it seems to be helping. She will continue her Effexor XR at 150 mg but will start to decrease her 37.5 mg capsule.  She will take it every other day for 1 to 2 weeks and then will stop it altogether while she uses her homeopathic medication twice daily.  She will notify us if this is inadequate and she is willing to restart BuSpar 5 mg p.o. twice daily and increasing to 3 times daily as needed. Spent 25 minutes discussing current state and plan of action.       Insomnia    Encouraged good sleep hygiene such as dark, quiet room. No blue/green glowing lights such as computer screens in bedroom. No alcohol or stimulants in evening. Cut down on caffeine as able. Regular exercise is helpful but not just prior to bed time. 1 mg of Lorazepam was no longer helping, so she has dropped to 1/2 tab and is using a sleep support homeopathic med and Gabapentin 100 mg with better results no changes today      Other Visit Diagnoses     LLQ pain    -  Primary   Relevant Orders   CBC with Differential/Platelet   Sedimentation rate   Comprehensive metabolic panel       F/U in 2 months  Meds ordered this encounter  Medications   gabapentin (NEURONTIN) 100 MG capsule    Sig: Take 1 capsule (100 mg total) by mouth at bedtime as needed.    I,  [name], MD, personally preformed the services described in this documentation.  All medical record entries made by the scribe were at my direction and in my presence.  I have reviewed the chart and discharge instructions (if applicable) and agree that the record reflects my personal performance and is accurate and complete. [name] 03/04/2021   I,Jada Bradford,acting as a scribe for [name], MD.,have documented all relevant documentation on the behalf of [name], MD,as directed by  [name], MD while in the presence of [name], MD.  I, Mosie Lukes, MD personally performed the services described in this documentation. All medical record entries made by the scribe were at my direction and in my presence. I have reviewed the chart and agree that the record reflects my personal performance and is accurate and complete    [name], MD

## 2021-03-04 ENCOUNTER — Ambulatory Visit (INDEPENDENT_AMBULATORY_CARE_PROVIDER_SITE_OTHER): Payer: PPO | Admitting: Family Medicine

## 2021-03-04 ENCOUNTER — Other Ambulatory Visit: Payer: Self-pay

## 2021-03-04 DIAGNOSIS — F418 Other specified anxiety disorders: Secondary | ICD-10-CM | POA: Diagnosis not present

## 2021-03-04 DIAGNOSIS — G4700 Insomnia, unspecified: Secondary | ICD-10-CM

## 2021-03-04 DIAGNOSIS — R739 Hyperglycemia, unspecified: Secondary | ICD-10-CM | POA: Diagnosis not present

## 2021-03-04 DIAGNOSIS — R1032 Left lower quadrant pain: Secondary | ICD-10-CM

## 2021-03-04 LAB — COMPREHENSIVE METABOLIC PANEL
ALT: 26 U/L (ref 0–35)
AST: 21 U/L (ref 0–37)
Albumin: 4.4 g/dL (ref 3.5–5.2)
Alkaline Phosphatase: 65 U/L (ref 39–117)
BUN: 15 mg/dL (ref 6–23)
CO2: 32 mEq/L (ref 19–32)
Calcium: 10.1 mg/dL (ref 8.4–10.5)
Chloride: 100 mEq/L (ref 96–112)
Creatinine, Ser: 0.62 mg/dL (ref 0.40–1.20)
GFR: 94.5 mL/min (ref >60.00)
Glucose, Bld: 89 mg/dL (ref 70–99)
Potassium: 4.8 mEq/L (ref 3.5–5.1)
Sodium: 138 mEq/L (ref 135–145)
Total Bilirubin: 0.3 mg/dL (ref 0.2–1.2)
Total Protein: 6.7 g/dL (ref 6.0–8.3)

## 2021-03-04 LAB — CBC WITH DIFFERENTIAL/PLATELET
Basophils Absolute: 0 10*3/uL (ref 0.0–0.1)
Basophils Relative: 0.3 % (ref 0.0–3.0)
Eosinophils Absolute: 0 10*3/uL (ref 0.0–0.7)
Eosinophils Relative: 0 % (ref 0.0–5.0)
HCT: 40.8 % (ref 36.0–46.0)
Hemoglobin: 13.5 g/dL (ref 12.0–15.0)
Lymphocytes Relative: 18.8 % (ref 12.0–46.0)
Lymphs Abs: 1.1 10*3/uL (ref 0.7–4.0)
MCHC: 33 g/dL (ref 30.0–36.0)
MCV: 95.1 fl (ref 78.0–100.0)
Monocytes Absolute: 0.5 10*3/uL (ref 0.1–1.0)
Monocytes Relative: 7.9 % (ref 3.0–12.0)
Neutro Abs: 4.2 10*3/uL (ref 1.4–7.7)
Neutrophils Relative %: 73 % (ref 43.0–77.0)
Platelets: 283 10*3/uL (ref 150.0–400.0)
RBC: 4.29 Mil/uL (ref 3.87–5.11)
RDW: 12.6 % (ref 11.5–15.5)
WBC: 5.8 10*3/uL (ref 4.0–10.5)

## 2021-03-04 LAB — SEDIMENTATION RATE: Sed Rate: 7 mm/hr (ref 0–30)

## 2021-03-04 MED ORDER — GABAPENTIN 100 MG PO CAPS: 100.0000 mg | ORAL_CAPSULE | Freq: Every evening | ORAL | Status: DC | PRN

## 2021-03-04 NOTE — Assessment & Plan Note (Addendum)
She notes her anxiety and ability to juggle many things at once is worsening. She denies any concerns about her overall memory. She has started a homeopathic preparation to support anxiety and it seems to be helping. She will continue her Effexor XR at 150 mg but will start to decrease her 37.5 mg capsule.  She will take it every other day for 1 to 2 weeks and then will stop it altogether while she uses her homeopathic medication twice daily.  She will notify us if this is inadequate and she is willing to restart BuSpar 5 mg p.o. twice daily and increasing to 3 times daily as needed. Spent 25 minutes discussing current state and plan of action.

## 2021-03-04 NOTE — Patient Instructions (Addendum)
Consider adding back Burpar/Buspirone 5 mg start with twice daily and can increase to three times daily if no side effects but incomplete response. From there can consider higher dosage Drop the Effexor 37.5 mg to every other day and plan to stop completely if able in 2-4 weeks. Can continue the 150 mg daily dose   Paxlovid is the new COVID medication we can give you if you get COVID so make sure you test if you have symptoms because we have to treat by day 5 of symptoms for it to be effective. If you are positive let us know so we can treat. If a home test is negative and your symptoms are persistent get a PCR test. Can check testing locations at [email or web address] If you are positive we will make an appointment with [name] and we will send in Paxlovid if you would like it. Check with your pharmacy before we meet to confirm they have it in stock, if they do not then we can get the prescription at the [facility], Adult Generalized anxiety disorder (GAD) is a mental health condition. Unlike normal worries, anxiety related to GAD is not triggered by a specific event. These worries do not fade or get better with time. GAD interferes with relationships, work, and school. GAD symptoms can vary from mild to severe. People with severe GAD can have intense waves of anxiety with physical symptoms that are similar to panic attacks. What are the causes? The exact cause of GAD is not known, but the following are believed to have an impact: Differences in natural brain chemicals. Genes passed down from parents to children. Differences in the way threats are perceived. Development during childhood. Personality. What increases the risk? The following factors may make you more likely to develop this condition: Being female. Having a family history of anxiety disorders. Being very shy. Experiencing very stressful life events, such as the death of a loved one. Having a  very stressful family environment. What are the signs or symptoms? People with GAD often worry excessively about many things in their lives, such as their health and family. Symptoms may also include: Mental and emotional symptoms: Worrying excessively about natural disasters. Fear of being late. Difficulty concentrating. Fears that others are judging your performance. Physical symptoms: Fatigue. Headaches, muscle tension, muscle twitches, trembling, or feeling shaky. Feeling like your heart is pounding or beating very fast. Feeling out of breath or like you cannot take a deep breath. Having trouble falling asleep or staying asleep, or experiencing restlessness. Sweating. Nausea, diarrhea, or irritable bowel syndrome (IBS). Behavioral symptoms: Experiencing erratic moods or irritability. Avoidance of new situations. Avoidance of people. Extreme difficulty making decisions. How is this diagnosed? This condition is diagnosed based on your symptoms and medical history. You will also have a physical exam. Your health care provider may perform tests to rule out other possible causes of your symptoms. To be diagnosed with GAD, a person must have anxiety that: Is out of his or her control. Affects several different aspects of his or her life, such as work and relationships. Causes distress that makes him or her unable to take part in normal activities. Includes at least three symptoms of GAD, such as restlessness, fatigue, trouble concentrating, irritability, muscle tension, or sleep problems. Before your health care provider can confirm a diagnosis of GAD, these symptoms must be present more days than they are not, and they must last for 6 months or longer. How is  this treated? This condition may be treated with: Medicine. Antidepressant medicine is usually prescribed for long-term daily control. Anti-anxiety medicines may be added in severe cases, especially when panic attacks occur. Talk  therapy (psychotherapy). Certain types of talk therapy can be helpful in treating GAD by providing support, education, and guidance. Options include: Cognitive behavioral therapy (CBT). People learn coping skills and self-calming techniques to ease their physical symptoms. They learn to identify unrealistic thoughts and behaviors and to replace them with more appropriate thoughts and behaviors. Acceptance and commitment therapy (ACT). This treatment teaches people how to be mindful as a way to cope with unwanted thoughts and feelings. Biofeedback. This process trains you to manage your body's response (physiological response) through breathing techniques and relaxation methods. You will work with a therapist while machines are used to monitor your physical symptoms. Stress management techniques. These include yoga, meditation, and exercise. A mental health specialist can help determine which treatment is best for you. Some people see improvement with one type of therapy. However, other people require a combination of therapies. Follow these instructions at home: Lifestyle Maintain a consistent routine and schedule. Anticipate stressful situations. Create a plan, and allow extra time to work with your plan. Practice stress management or self-calming techniques that you have learned from your therapist or your health care provider. General instructions Take over-the-counter and prescription medicines only as told by your health care provider. Understand that you are likely to have setbacks. Accept this and be kind to yourself as you persist to take better care of yourself. Recognize and accept your accomplishments, even if you judge them as small. Keep all follow-up visits as told by your health care provider. This is important. Contact a health care provider if: Your symptoms do not get better. Your symptoms get worse. You have signs of depression, such as: A persistently sad or irritable  mood. Loss of enjoyment in activities that used to bring you joy. Change in weight or eating. Changes in sleeping habits. Avoiding friends or family members. Loss of energy for normal tasks. Feelings of guilt or worthlessness. Get help right away if: You have serious thoughts about hurting yourself or others. If you ever feel like you may hurt yourself or others, or have thoughts about taking your own life, get help right away. Go to your nearest emergency department or: Call your local emergency services (911 in the [place] Call a suicide crisis helpline, such as the [place] at (774) 392-2852. This is open 24 hours a day in the [place] Text the Crisis Text Line at 860-707-0496 (in the [place].). Summary Generalized anxiety disorder (GAD) is a mental health condition that involves worry that is not triggered by a specific event. People with GAD often worry excessively about many things in their lives, such as their health and family. GAD may cause symptoms such as restlessness, trouble concentrating, sleep problems, frequent sweating, nausea, diarrhea, headaches, and trembling or muscle twitching. A mental health specialist can help determine which treatment is best for you. Some people see improvement with one type of therapy. However, other people require a combination of therapies. This information is not intended to replace advice given to you by your health care provider. Make sure you discuss any questions you have with your health care provider. Document Revised: 03/27/2019 Document Reviewed: 03/27/2019 Elsevier Patient Education  [place].

## 2021-03-04 NOTE — Assessment & Plan Note (Signed)
Encouraged good sleep hygiene such as dark, quiet room. No blue/green glowing lights such as computer screens in bedroom. No alcohol or stimulants in evening. Cut down on caffeine as able. Regular exercise is helpful but not just prior to bed time. 1 mg of Lorazepam was no longer helping, so she has dropped to 1/2 tab and is using a sleep support homeopathic med and Gabapentin 100 mg with better results no changes today

## 2021-03-04 NOTE — Assessment & Plan Note (Signed)
Continue to maintain a heart healthy diet and stay active

## 2021-03-05 ENCOUNTER — Encounter: Payer: Self-pay | Admitting: Family Medicine

## 2021-03-10 DIAGNOSIS — N958 Other specified menopausal and perimenopausal disorders: Secondary | ICD-10-CM | POA: Diagnosis not present

## 2021-03-10 DIAGNOSIS — M4125 Other idiopathic scoliosis, thoracolumbar region: Secondary | ICD-10-CM | POA: Diagnosis not present

## 2021-03-10 DIAGNOSIS — M816 Localized osteoporosis [Lequesne]: Secondary | ICD-10-CM | POA: Diagnosis not present

## 2021-03-17 ENCOUNTER — Inpatient Hospital Stay: Payer: PPO | Admitting: Genetic Counselor

## 2021-03-17 ENCOUNTER — Ambulatory Visit (INDEPENDENT_AMBULATORY_CARE_PROVIDER_SITE_OTHER): Payer: PPO

## 2021-03-17 ENCOUNTER — Encounter: Payer: Self-pay | Admitting: Adult Health

## 2021-03-17 ENCOUNTER — Other Ambulatory Visit: Payer: Self-pay

## 2021-03-17 ENCOUNTER — Inpatient Hospital Stay: Payer: PPO

## 2021-03-17 ENCOUNTER — Ambulatory Visit: Payer: PPO | Admitting: Adult Health

## 2021-03-17 VITALS — BP 122/70 | HR 80 | Temp 97.8°F | Ht 62.0 in | Wt 155.2 lb

## 2021-03-17 DIAGNOSIS — J4550 Severe persistent asthma, uncomplicated: Secondary | ICD-10-CM | POA: Diagnosis not present

## 2021-03-17 DIAGNOSIS — B4481 Allergic bronchopulmonary aspergillosis: Secondary | ICD-10-CM | POA: Diagnosis not present

## 2021-03-17 DIAGNOSIS — J45909 Unspecified asthma, uncomplicated: Secondary | ICD-10-CM

## 2021-03-17 DIAGNOSIS — K219 Gastro-esophageal reflux disease without esophagitis: Secondary | ICD-10-CM

## 2021-03-17 DIAGNOSIS — J209 Acute bronchitis, unspecified: Secondary | ICD-10-CM | POA: Diagnosis not present

## 2021-03-17 DIAGNOSIS — J9811 Atelectasis: Secondary | ICD-10-CM | POA: Diagnosis not present

## 2021-03-17 DIAGNOSIS — K449 Diaphragmatic hernia without obstruction or gangrene: Secondary | ICD-10-CM | POA: Diagnosis not present

## 2021-03-17 MED ORDER — ALBUTEROL SULFATE HFA 108 (90 BASE) MCG/ACT IN AERS: 1.0000 | INHALATION_SPRAY | Freq: Four times a day (QID) | RESPIRATORY_TRACT | 2 refills | Status: DC | PRN

## 2021-03-17 MED ORDER — PREDNISONE 20 MG PO TABS: 20.0000 mg | ORAL_TABLET | Freq: Every day | ORAL | 0 refills | Status: DC

## 2021-03-17 MED ORDER — BENZONATATE 200 MG PO CAPS: 200.0000 mg | ORAL_CAPSULE | Freq: Three times a day (TID) | ORAL | 1 refills | Status: DC | PRN

## 2021-03-17 MED ORDER — AZITHROMYCIN 250 MG PO TABS: ORAL_TABLET | ORAL | 0 refills | Status: AC

## 2021-03-17 NOTE — Patient Instructions (Signed)
Zpack take as directed  Prednisone 20mg  daily for 3 days  Continue on Fasenra .  Mucinex DM liquid Twice daily  for cough /congestion as needed  Tessalon Three times a day  for cough As needed   Albuterol inhaler or Neb As needed   Follow up with Dr. Chase Caller in 3 months and As needed   Please contact office for sooner follow up if symptoms do not improve or worsen or seek emergency care

## 2021-03-17 NOTE — Progress Notes (Signed)
@Patient  ID: Victoria [name], female    DOB: January 10, 1956, 65 y.o.   MRN: [id number]  Chief Complaint  Patient presents with   Follow-up    Referring provider: Mosie Lukes, MD  HPI: 65 year old female never smoker followed for chronic asthma with ABPA   TEST/EVENTS :  PFTs Oct 19, 2017 FEV1 at 75%, ratio 67, FVC 86%, no significant bronchodilator response, DLCO 90%.  CT chest October 2021 negative for PE, chronic scarring-biapical pleural and parenchymal scarring right lower lobe nodule stable consistent with benign etiology, large hiatal hernia  Fasenra started late 2-18/early 2019    03/17/2021 Follow up : Asthma  Patient presents for work in visit.  Patient complains over the last week she has had increased cough, congestion, thick mucus, severe coughing paroxysms and pain in the ribs from coughing.  Patient says she has been doing very well up until the last week.  She has been on [place] which is made a huge difference in her life.  She also is off of Fosamax which seems to have helped as well.  She denies any hemoptysis, chest pain, orthopnea.  She has been using some over-the-counter cough medicines without much relief.  She denies any fever body aches.  Chest x-ray shows a linear opacity along the left lateral base with subsegmental atelectasis.     Allergies  Allergen Reactions   Beclomethasone Dipropionate Hives and Other (See Comments)     weight gain   Flexeril [Cyclobenzaprine] Anxiety   Mometasone Furo-Formoterol Fum Hives and Other (See Comments)    weight gain   Sulfonamide Derivatives Hives and Rash   Statins     Myalgias, RLS    Immunization History  Administered Date(s) Administered   Influenza Split 04/20/2011, 04/20/2020   Influenza Whole 06/06/2007, 04/15/2008, 04/02/2009, 03/29/2012   Influenza, High Dose Seasonal PF 05/05/2015   Influenza,inj,Quad PF,6+ Mos 05/09/2013, 03/03/2014, 05/03/2016, 04/21/2017, 04/02/2018, 03/19/2019, 04/20/2020    Moderna Sars-Covid-2 Vaccination 08/15/2019, 09/17/2019, 05/16/2020, 12/04/2020   Pneumococcal Conjugate-13 05/09/2013   Pneumococcal Polysaccharide-23 05/04/2005   Td 07/29/2009   Tdap 03/11/2015   Zoster Recombinat (Shingrix) 05/09/2019, 08/01/2019    Past Medical History:  Diagnosis Date   Allergic bronchopulmonary aspergillosis ([place]) 2008   sees Dr Edmund Hilda pulmonology   Anemia    iron deficiency, resolved   Anxiety    Asthma    CAD (coronary artery disease)    a. LHC 6/16:  oOM1 60, pRCA 25 >> med Rx b. cath 3/19 2nd OM with 95% stenosis s/p synergy DES & anomalous RCA   CAP (community acquired pneumonia) 2016; 06/07/2016   Chronic bronchitis (HCC)    Chronic lower back pain    Complication of anesthesia    "think I have a hard time waking up from it"   COPD (chronic obstructive pulmonary disease) ([place])    Depression    mild   Diverticulitis    Diverticulosis    GERD (gastroesophageal reflux disease)    H/O hiatal hernia    Headache    "weekly" (08/23/2017)   History of echocardiogram    Echo 6/16:  Mod LVH, EF 60-65%, no RWMA, Gr 1 DD, trivial MR, normal LA size.   Hyperglycemia 11/20/2015   Hyperlipidemia, mixed 09/11/2007   Qualifier: Diagnosis of  By: Jerold Coombe   Did not tolerate Lipitor, zocor, Lovastatin, Pravastatin, Livalo, Crestor even low dose    IBS (irritable bowel syndrome)    Maxillary sinusitis    Normal cardiac stress test  11/2011   No evidence of ischemia or infarct.   Calculated ejection fraction 72%.   Obesity    OSA (obstructive sleep apnea) 02/2012   has stopped using  cpap   Osteoarthritis    Osteoporosis    Pneumonia 11/2011   "before 2013 I hadn't had pneumonia since I was a child" (04/13/2012)   Pulmonary nodules    S/P angioplasty with stent 08/23/17 ostial 2nd OM with DES synnergy 08/24/2017   Schatzki's ring     Tobacco History: Social History   Tobacco Use  Smoking Status Never  Smokeless Tobacco Never   Counseling  given: Not Answered   Outpatient Medications Prior to Visit  Medication Sig Dispense Refill   acyclovir ointment (ZOVIRAX) 5 % Apply 1 application topically every 3 (three) hours. 15 g 2   aspirin 81 MG tablet Take 81 mg by mouth daily.     Benralizumab (FASENRA PEN) 30 MG/ML SOAJ Inject 30 mg into the skin every 8 (eight) weeks. 1 mL 5   Bioflavonoid Products (ESTER C PO) Take 500 mg by mouth daily.     Calcium-Magnesium-Vitamin D 300-150-400 MG-MG-UNIT TABS Take 1 tablet by mouth daily.     denosumab (PROLIA) 60 MG/ML SOSY injection Inject 60 mg into the skin every 6 (six) months.     famotidine (PEPCID) 20 MG tablet TAKE 1 TABLET (20 MG TOTAL) BY MOUTH AT BEDTIME. 30 tablet 3   gabapentin (NEURONTIN) 100 MG capsule Take 1 capsule (100 mg total) by mouth at bedtime as needed.     LORazepam (ATIVAN) 1 MG tablet TAKE 1/2 TO 1 TABLET BY MOUTH 2 TIMES A DAY AS NEEDED FOR ANXIETY OR SLEEP 30 tablet 2   MAGNESIUM GLYCINATE PO Take 400 mg by mouth daily.     MODERNA COVID-19 VACCINE 100 MCG/0.5ML injection      nitroGLYCERIN (NITROSTAT) 0.4 MG SL tablet PLACE 1 TABLET UNDER THE TONGUE EVERY 5 MINUTES AS NEEDED FOR CHEST PAIN. 25 tablet 4   REPATHA SURECLICK 644 MG/ML SOAJ INJECT 1 PEN INTO THE SKIN EVERY 14 DAYS 6 mL 3   venlafaxine XR (EFFEXOR XR) 37.5 MG 24 hr capsule Take 1 capsule (37.5 mg total) by mouth daily with breakfast. 30 capsule 1   venlafaxine XR (EFFEXOR-XR) 150 MG 24 hr capsule Take 1 capsule (150 mg total) by mouth daily with breakfast. Take with 37.5 mg cap daily 90 capsule 0   VITAMIN D PO Take 5,000 Units by mouth daily.     ZINC CITRATE PO Take 30 mg by mouth daily.     ipratropium-albuterol (DUONEB) 0.5-2.5 (3) MG/3ML SOLN ipratropium 0.5 mg-albuterol 3 mg (2.5 mg base)/3 mL nebulization soln  USE 1 VIAL BY NEBULIZATION EVERY 6 (SIX) HOURS AS NEEDED.     No facility-administered medications prior to visit.     Review of Systems:   Constitutional:   No  weight loss,  night sweats,  Fevers, chills,  +fatigue, or  lassitude.  HEENT:   No headaches,  Difficulty swallowing,  Tooth/dental problems, or  Sore throat,                No sneezing, itching, ear ache,  +nasal congestion, post nasal drip,   CV:  No chest pain,  Orthopnea, PND, swelling in lower extremities, anasarca, dizziness, palpitations, syncope.   GI  No heartburn, indigestion, abdominal pain, nausea, vomiting, diarrhea, change in bowel habits, loss of appetite, bloody stools.   Resp:  No chest wall deformity  Skin:  no rash or lesions.  GU: no dysuria, change in color of urine, no urgency or frequency.  No flank pain, no hematuria   MS:  No joint pain or swelling.  No decreased range of motion.  No back pain.    Physical Exam  BP 122/70 (BP Location: Left Arm, Patient Position: Sitting, Cuff Size: Normal)   Pulse 80   Temp 97.8 F (36.6 C) (Oral)   Ht 5\' 2"  (1.575 m)   Wt 155 lb 3.2 oz (70.4 kg)   SpO2 99%   BMI 28.39 kg/m   GEN: A/Ox3; pleasant , NAD, well nourished    HEENT:  [place]/AT,  EACs-clear, TMs-wnl, NOSE-clear, THROAT-clear, no lesions, no postnasal drip or exudate noted.   NECK:  Supple w/ fair ROM; no JVD; normal carotid impulses w/o bruits; no thyromegaly or nodules palpated; no lymphadenopathy.    RESP : Few rhonchi bilaterally no accessory muscle use, no dullness to percussion  CARD:  RRR, no m/r/g, no peripheral edema, pulses intact, no cyanosis or clubbing.  GI:   Soft & nt; nml bowel sounds; no organomegaly or masses detected.   Musco: Warm bil, no deformities or joint swelling noted.   Neuro: alert, no focal deficits noted.    Skin: Warm, no lesions or rashes    Lab Results:  CBC   BMET     Imaging: No results found.    PFT Results Latest Ref Rng & Units 10/19/2017  FVC-Pre L 2.65  FVC-Predicted Pre % 86  FVC-Post L 2.57  FVC-Predicted Post % 84  Pre FEV1/FVC % % 67  Post FEV1/FCV % % 70  FEV1-Pre L 1.79  FEV1-Predicted Pre % 75   FEV1-Post L 1.80  DLCO uncorrected ml/min/mmHg 19.51  DLCO UNC% % 90  DLVA Predicted % 104  TLC L 4.90  TLC % Predicted % 103  RV % Predicted % 118    Lab Results  Component Value Date   NITRICOXIDE 42 04/21/2017        Assessment & Plan:   No problem-specific Assessment & Plan notes found for this encounter.     Rexene Edison, NP 03/17/2021

## 2021-03-18 MED ORDER — PREDNISONE 20 MG PO TABS: 40.0000 mg | ORAL_TABLET | Freq: Every day | ORAL | 0 refills | Status: DC

## 2021-03-18 NOTE — Telephone Encounter (Signed)
Hello Tammy, please see mychart message below, thanks!  I was awake 3 times last night with coughing episodes.  and I'm definitely worse today.  Even with Tessalon Pearls and liquid Guiafenesen. So, I'm just not sure the 20 mg. x3 days of Prednisone is going to cut it.  I'd like to take the another 20 mg today if you could call more in.    Thank you, Marya Fossa

## 2021-03-18 NOTE — Assessment & Plan Note (Signed)
Flare  Chest xray with LLL Basilar atx vs infiltrate  Treat with abx and steroids   Plan  Patient Instructions  Zpack take as directed  Prednisone 20mg  daily for 3 days  Continue on Fasenra .  Mucinex DM liquid Twice daily  for cough /congestion as needed  Tessalon Three times a day  for cough As needed   Albuterol inhaler or Neb As needed   Follow up with Dr. Chase Caller in 3 months and As needed   Please contact office for sooner follow up if symptoms do not improve or worsen or seek emergency care

## 2021-03-18 NOTE — Telephone Encounter (Signed)
Sorry to hear this , can increase Prednisone 40mg  daily for 5 days and stop  Rx sent to pharm   Please contact office for sooner follow up if symptoms do not improve or worsen or seek emergency care

## 2021-03-18 NOTE — Assessment & Plan Note (Signed)
Significant improvement on Fasenra .  Asthma flare -tx w/ Steroids .   Plan  Patient Instructions  Zpack take as directed  Prednisone 20mg  daily for 3 days  Continue on Fasenra .  Mucinex DM liquid Twice daily  for cough /congestion as needed  Tessalon Three times a day  for cough As needed   Albuterol inhaler or Neb As needed   Follow up with Dr. Chase Caller in 3 months and As needed   Please contact office for sooner follow up if symptoms do not improve or worsen or seek emergency care

## 2021-03-18 NOTE — Assessment & Plan Note (Signed)
Cont on current regimen  

## 2021-04-07 ENCOUNTER — Other Ambulatory Visit: Payer: PPO | Admitting: *Deleted

## 2021-04-07 ENCOUNTER — Other Ambulatory Visit: Payer: Self-pay

## 2021-04-07 DIAGNOSIS — I7121 Aneurysm of the ascending aorta, without rupture: Secondary | ICD-10-CM

## 2021-04-07 DIAGNOSIS — Z0189 Encounter for other specified special examinations: Secondary | ICD-10-CM

## 2021-04-07 DIAGNOSIS — M542 Cervicalgia: Secondary | ICD-10-CM | POA: Diagnosis not present

## 2021-04-07 DIAGNOSIS — M545 Low back pain, unspecified: Secondary | ICD-10-CM | POA: Diagnosis not present

## 2021-04-07 LAB — BASIC METABOLIC PANEL
BUN/Creatinine Ratio: 23 (ref 12–28)
BUN: 14 mg/dL (ref 8–27)
CO2: 24 mmol/L (ref 20–29)
Calcium: 9.1 mg/dL (ref 8.7–10.3)
Chloride: 99 mmol/L (ref 96–106)
Creatinine, Ser: 0.6 mg/dL (ref 0.57–1.00)
Glucose: 93 mg/dL (ref 70–99)
Potassium: 4.3 mmol/L (ref 3.5–5.2)
Sodium: 137 mmol/L (ref 134–144)
eGFR: 101 mL/min/{1.73_m2} (ref >59)

## 2021-04-09 ENCOUNTER — Telehealth: Payer: Self-pay | Admitting: Pharmacy Technician

## 2021-04-09 NOTE — Telephone Encounter (Signed)
For 2023, [employer] patients are being auto-approved through 2023, only need to send in new prescription. Completed rx page and will place in MD box for signature (must be wet sig)

## 2021-04-14 DIAGNOSIS — M545 Low back pain, unspecified: Secondary | ICD-10-CM | POA: Diagnosis not present

## 2021-04-14 DIAGNOSIS — H43391 Other vitreous opacities, right eye: Secondary | ICD-10-CM | POA: Diagnosis not present

## 2021-04-14 DIAGNOSIS — M542 Cervicalgia: Secondary | ICD-10-CM | POA: Diagnosis not present

## 2021-04-15 DIAGNOSIS — M4316 Spondylolisthesis, lumbar region: Secondary | ICD-10-CM | POA: Diagnosis not present

## 2021-04-15 DIAGNOSIS — M4125 Other idiopathic scoliosis, thoracolumbar region: Secondary | ICD-10-CM | POA: Diagnosis not present

## 2021-04-16 ENCOUNTER — Ambulatory Visit (INDEPENDENT_AMBULATORY_CARE_PROVIDER_SITE_OTHER)
Admission: RE | Admit: 2021-04-16 | Discharge: 2021-04-16 | Disposition: A | Discharge status: Home / Self Care | Payer: PPO | Source: Ambulatory Visit | Attending: Cardiology | Admitting: Cardiology

## 2021-04-16 ENCOUNTER — Other Ambulatory Visit: Payer: Self-pay

## 2021-04-16 DIAGNOSIS — Z0189 Encounter for other specified special examinations: Secondary | ICD-10-CM

## 2021-04-16 DIAGNOSIS — I7121 Aneurysm of the ascending aorta, without rupture: Secondary | ICD-10-CM

## 2021-04-16 DIAGNOSIS — K449 Diaphragmatic hernia without obstruction or gangrene: Secondary | ICD-10-CM | POA: Diagnosis not present

## 2021-04-16 MED ORDER — IOHEXOL 350 MG/ML SOLN
100.0000 mL | Freq: Once | INTRAVENOUS | Status: AC | PRN
  Administered 2021-04-16: 100 mL via INTRAVENOUS

## 2021-04-19 NOTE — Telephone Encounter (Signed)
Signed provider form returned to pharmacy and subsequently faxed to [employer]. Confirmation of successful fax received, nothing further required at this time.

## 2021-04-23 ENCOUNTER — Telehealth: Payer: Self-pay | Admitting: Internal Medicine

## 2021-04-27 NOTE — Telephone Encounter (Signed)
Returned call to patient to discuss. Advised that [employer] is enrolling patient into 2023 as long as they confirm no changes to insurance and financial status. Unable to reach patient, left VM requesting return call  Knox Saliva, PharmD, MPH, BCPS Clinical Pharmacist (Rheumatology and Pulmonology)

## 2021-04-28 NOTE — Telephone Encounter (Signed)
Patient called saying she is now taking three biologics: [name] with Prolia with  In an 8 week period, she took Repatha on Monday, then [place] on Friday.  Patient had f/u with spine doctor who was surprised that patient is on three biologics. Then she became concerned if there was an interaction with being on three biologics. I re-iterated that three biologics do not interact because they are targeted medications but that Prolia can cause pain, flu-like symptoms the day after dose.  Patient states she is only taking Repatha once monthly rather than every 14 days because of back pain - has not notified managing provider's office regarding this but has f/u appt to discuss.  She states she also took COVID19 vaccine booster that same week and had abdominal pain, back pain, and was concerned if being on three biologics put her at risk for side effects from vaccine. I reviewed that most vaccines have the risk of causing fever/flu-like symptoms transiently for 24-48 hours after administration but should subside.  She verbalized understanding. Nothing further needed.  Knox Saliva, PharmD, MPH, BCPS Clinical Pharmacist (Rheumatology and Pulmonology)

## 2021-04-30 ENCOUNTER — Telehealth: Payer: Self-pay

## 2021-04-30 NOTE — Telephone Encounter (Signed)
Left message for patient to return call to  office in reference to Prolia injection appointment for 05/12/2021 will cancel appointment until patient return call for oop cost of injection.

## 2021-05-03 ENCOUNTER — Encounter: Payer: Self-pay | Admitting: Family Medicine

## 2021-05-04 DIAGNOSIS — M545 Low back pain, unspecified: Secondary | ICD-10-CM | POA: Diagnosis not present

## 2021-05-04 DIAGNOSIS — M542 Cervicalgia: Secondary | ICD-10-CM | POA: Diagnosis not present

## 2021-05-10 ENCOUNTER — Ambulatory Visit: Payer: PPO

## 2021-05-10 ENCOUNTER — Other Ambulatory Visit: Payer: Self-pay

## 2021-05-10 DIAGNOSIS — I2510 Atherosclerotic heart disease of native coronary artery without angina pectoris: Secondary | ICD-10-CM | POA: Diagnosis not present

## 2021-05-10 DIAGNOSIS — T466X5D Adverse effect of antihyperlipidemic and antiarteriosclerotic drugs, subsequent encounter: Secondary | ICD-10-CM

## 2021-05-10 DIAGNOSIS — I2583 Coronary atherosclerosis due to lipid rich plaque: Secondary | ICD-10-CM | POA: Diagnosis not present

## 2021-05-10 DIAGNOSIS — G720 Drug-induced myopathy: Secondary | ICD-10-CM

## 2021-05-10 MED ORDER — REPATHA SURECLICK 140 MG/ML ~~LOC~~ SOAJ: SUBCUTANEOUS | 3 refills | Status: DC

## 2021-05-10 NOTE — Progress Notes (Signed)
Patient ID: Victoria [name]                 DOB: 06-05-1956                    MRN: [id number]     HPI: Victoria [name] is a 65 y.o. female patient referred to lipid clinic by Dr. Johney Frame. PMH is significant for  hx of CAD s/p DES to OM on 08/2017, ascending aortic aneurysm, HLD, GERD, h/x of GIB, anxiety, depression, asthma, COPD, and OSA on CPAP. Previously seen in lipid clinic in 2018 and started on Repatha given history of statin intolerance.  Last seen by Dr. Johney Frame 11/11/2020 and has been taking Repatha every 3 weeks due to muscle aches with administration every 2 weeks. She underwent a CT angio chest aorta 04/16/2021 which found scattered coronary calcifications, no stenosis of the thoracic aorta, and moderate hiatal hernia. The severity of the coronary calcifications could not be assessed.   The patient presents today to further discuss her lipid management and Repatha. She is interested in coming off some of her biologics. She has been maintained on Repatha since 07/2016. Has been on multiple statins, but had myalgias. Also took Zetia and Lovaza at one point. She notes she also takes Prolia for her osteoporosis and benralizumab for her asthma and that she recently saw a physician at [facility] who was surprised she was on 3 biologic agents. She expresses concerns given that she may need surgery down the road.   She notes that she felt particularly bad after her recent COVID booster and developed N/V and felt miserable, which she did not have with other prior shots. She is concerned that because she had 2 of her biologic injections around this time it contributed to her malaise. She also states that she has been taking the Repatha about once a month and needs to be more consistent with her injections. She is due to see her PCP next month and will have lipid panel checked. She also needs her Repatha refilled and requests it is sent to [facility] as her typical independent pharmacy did not  have the medication last time.  Current Medications: Repatha 140 mg evey 3 weeks Intolerances: Did not tolerate Lipitor 10 daily, zocor 40, Lovastatin, Pravastatin, Livalo 2mg  daily, Crestor 10 MWF, even low dose (myalgias) Lovaza, Zetia Risk Factors: premature CAD, HLD, family history LDL goal: <55 mg/dL  Diet: leftover quience and oat toast with PB, coffee, brown rice, salads, chicken  Exercise: walking, pool exercise in summer   Family History: Breast cancer in her mother and sister; Cancer in her mother and sister; Diabetes in her mother; Diverticulosis in her father; Heart attack in her maternal grandfather; Hypertension in her mother; Prostate cancer in her father; Pulmonary embolism in her brother. 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 LHC 6/16:  oOM1 60, pRCA  25 >> med Rx b. cath 3/19 2nd OM with 95% stenosis s/p synergy DES & anomalous RCA   CAP (community acquired pneumonia) 2016; 06/07/2016   Chronic bronchitis ([place])    Chronic lower back pain    Complication of anesthesia    "think I have a hard time waking up from it"   COPD (chronic obstructive pulmonary disease) ([place])    Depression    mild   Diverticulitis    Diverticulosis    GERD (gastroesophageal reflux disease)    H/O hiatal hernia    Headache    "weekly" (08/23/2017)   History of echocardiogram    Echo 6/16:  Mod LVH, EF 60-65%, no RWMA, Gr 1 DD, trivial MR, normal LA size.    Hyperglycemia 11/20/2015   Hyperlipidemia, mixed 09/11/2007   Qualifier: Diagnosis of  By: Jerold Coombe   Did not tolerate Lipitor, zocor, Lovastatin, Pravastatin, Livalo, Crestor even low dose    IBS (irritable bowel syndrome)    Maxillary sinusitis    Normal cardiac stress test 11/2011   No evidence of ischemia or infarct.   Calculated ejection fraction 72%.   Obesity    OSA (obstructive sleep apnea) 02/2012   has stopped using  cpap   Osteoarthritis    Osteoporosis    Pneumonia 11/2011   "before 2013 I hadn't had pneumonia since I was a child" (04/13/2012)   Pulmonary nodules    S/P angioplasty with stent 08/23/17 ostial 2nd OM with DES synnergy 08/24/2017   Schatzki's ring     Current Outpatient Medications on File Prior to Visit  Medication Sig Dispense Refill   acyclovir ointment (ZOVIRAX) 5 % Apply 1 application topically every 3 (three) hours. 15 g 2   albuterol (VENTOLIN HFA) 108 (90 Base) MCG/ACT inhaler Inhale 1-2 puffs into the lungs every 6 (six) hours as needed. 8 g 2   aspirin 81 MG tablet Take 81 mg by mouth daily.     Benralizumab (FASENRA PEN) 30 MG/ML SOAJ Inject 30 mg into the skin every 8 (eight) weeks. 1 mL 5   benzonatate (TESSALON) 200 MG capsule Take 1 capsule (200 mg total) by mouth 3 (three) times daily as needed for cough. 30 capsule 1   Bioflavonoid Products (ESTER C PO) Take 500 mg by mouth daily.     Calcium-Magnesium-Vitamin D 300-150-400 MG-MG-UNIT TABS Take 1 tablet by mouth daily.     denosumab (PROLIA) 60 MG/ML SOSY injection Inject 60 mg into the skin every 6 (six) months.     famotidine (PEPCID) 20 MG tablet TAKE 1 TABLET (20 MG TOTAL) BY MOUTH AT BEDTIME. 30 tablet 3   gabapentin (NEURONTIN) 100 MG capsule Take 1 capsule (100 mg total) by mouth at bedtime as needed.     ipratropium-albuterol (DUONEB) 0.5-2.5 (3) MG/3ML SOLN ipratropium 0.5 mg-albuterol 3 mg (2.5 mg base)/3 mL nebulization soln  USE 1 VIAL BY NEBULIZATION EVERY 6 (SIX) HOURS AS NEEDED.      LORazepam (ATIVAN) 1 MG tablet TAKE 1/2 TO 1 TABLET BY MOUTH 2 TIMES A DAY AS NEEDED FOR ANXIETY OR SLEEP 30 tablet 2   MAGNESIUM GLYCINATE PO Take 400 mg by mouth daily.     MODERNA COVID-19 VACCINE 100 MCG/0.5ML injection      nitroGLYCERIN (NITROSTAT) 0.4 MG SL tablet PLACE 1 TABLET UNDER THE TONGUE EVERY 5 MINUTES AS NEEDED FOR CHEST PAIN. 25 tablet 4   predniSONE (DELTASONE) 20 MG tablet Take 1 tablet (20 mg total) by mouth daily  with breakfast. 3 tablet 0   predniSONE (DELTASONE) 20 MG tablet Take 2 tablets (40 mg total) by mouth daily with breakfast. 10 tablet 0   REPATHA SURECLICK 811 MG/ML SOAJ INJECT 1 PEN INTO THE SKIN EVERY 14 DAYS 6 mL 3   venlafaxine XR (EFFEXOR XR) 37.5 MG 24 hr capsule Take 1 capsule (37.5 mg total) by mouth daily with breakfast. 30 capsule 1   venlafaxine XR (EFFEXOR-XR) 150 MG 24 hr capsule Take 1 capsule (150 mg total) by mouth daily with breakfast. Take with 37.5 mg cap daily 90 capsule 0   VITAMIN D PO Take 5,000 Units by mouth daily.     ZINC CITRATE PO Take 30 mg by mouth daily.     No current facility-administered medications on file prior to visit.    Allergies  Allergen Reactions   Beclomethasone Dipropionate Hives and Other (See Comments)     weight gain   Flexeril [Cyclobenzaprine] Anxiety   Mometasone Furo-Formoterol Fum Hives and Other (See Comments)    weight gain   Sulfonamide Derivatives Hives and Rash   Statins     Myalgias, RLS    Assessment/Plan:  1. Multiple biologic agents - Patient is on 3 biologic agents. No drug-drug interactions with the patient on these 3 agents. Patient educated that typical concern with biologic agents is risk of infection by decreasing own immune function. However, Repatha and benralizumab have low risk of infection given their specificity for their target receptors. Additionally, her [place] is only twice a year, so I have low concern for risk of infection, which this medication has the highest incidence  of URTI at 3-5% and 4% for serious infections. Noted that the patient has prednisone on her mediation list with dose of 20-40 mg daily, which is a concern for immunosuppression however, patient states that this is only as needed for flair ups. Biologic medications assessed for overlapping side effects. Overlapping side effects include respiratory infections with Repatha and Prolia, with 3% increase vs. placebo in DESCARTES trial with Repatha. Instructed patient to try and space out biologic injections so that they do not overlap on the same day to help with potential side effects. Discussed with patient it is difficult to determine if the biologic agents contributed to her experience with the COVID booster given limited data.   2. Hyperlipidemia - Patients most recent LDL of 85 mg/dL above goal of < 55 mg/dL on Repatha 140 mg every 3 weeks, however patient has been taking the medication about every month and notes that she needs to be better about taking it every 3 weeks (ideally prefer every 2 weeks however pt experienced side effects with this). New prescription sent to [facility] for Repatha refill. She has a PCP visit in 1 month where lipid panel will be checked. Patient may need additional lipid lowering medication, but that can be assessed after her PCP visit based upon lipid panel results and her adherence to the every 3 weeks of Repatha due to her myalgias. If she needs an additional agent would recommend bempedoic acid, however it is non-formulary per her insurance so PA would be needed to determine cost. Encouraged patient to continue making good food choices and continue eating lean protein, veggies, and whole grains and exercising.  Patient seen with Fuller Canada, PharmD, BCACP, CPP.   Cathrine Muster, PharmD PGY2 Cardiology Pharmacy Resident 05/10/2021  12:44 PM

## 2021-05-10 NOTE — Patient Instructions (Addendum)
You are taking 3 biologic medications. The main concern with the biologics is increased risk of infections. The Repatha and asthma medications are very specific medications, so the risk of infection in pretty low. Try to space out your biologic medications so that they don't all overlap to decrease potential side effects.   Your LDL was 85 mg/dL at last check, which is still above your goal of <55 mg/dL. Consistently taking your Repatha will help lower this.  A prescription for your Repatha was sent to [facility] for 3 months. It will say to take the medication every 2 weeks, but continue with your every 3 week schedule.

## 2021-05-11 ENCOUNTER — Ambulatory Visit: Payer: PPO

## 2021-05-11 NOTE — Progress Notes (Deleted)
Victoria [name] is a 65 y.o. female presents to the office today for Prolia injection, per physician's orders. Prolia 60 mg/mL, *** (dose),  SQ  (route) was administered *** (location) today. Patient tolerated injection. Patient next injection due: 6 months, appt made {yes/no:20286}  [place], [place]

## 2021-05-12 ENCOUNTER — Telehealth: Payer: Self-pay | Admitting: *Deleted

## 2021-05-12 ENCOUNTER — Ambulatory Visit: Payer: PPO

## 2021-05-12 ENCOUNTER — Other Ambulatory Visit: Payer: Self-pay

## 2021-05-12 ENCOUNTER — Ambulatory Visit (INDEPENDENT_AMBULATORY_CARE_PROVIDER_SITE_OTHER): Payer: PPO | Admitting: *Deleted

## 2021-05-12 DIAGNOSIS — M810 Age-related osteoporosis without current pathological fracture: Secondary | ICD-10-CM | POA: Diagnosis not present

## 2021-05-12 MED ORDER — DENOSUMAB 60 MG/ML ~~LOC~~ SOSY
60.0000 mg | PREFILLED_SYRINGE | Freq: Once | SUBCUTANEOUS | Status: AC
  Administered 2021-05-12: 60 mg via SUBCUTANEOUS

## 2021-05-12 NOTE — Progress Notes (Signed)
Patient here for Prolia injection per Dr. Charlett Blake.  Injection given in right arm and patient tolerated well.

## 2021-05-12 NOTE — Telephone Encounter (Signed)
Prolia given today 05/12/21

## 2021-05-17 ENCOUNTER — Telehealth: Payer: Self-pay | Admitting: Family Medicine

## 2021-05-17 NOTE — Telephone Encounter (Signed)
Pt is experiencing sinus symptoms. Pressure behind her nose and the bridge of her nose, as well as teeth aching. Pt has been taking 2 Advils and 30 mg pseudoephedrine, but seems not to be working. Pt would like suggestions. Please advise.

## 2021-05-18 NOTE — Telephone Encounter (Signed)
Pt returned phone call and would like for someone to contact her. Please advise.

## 2021-05-18 NOTE — Telephone Encounter (Signed)
Lvm to call back

## 2021-05-18 NOTE — Telephone Encounter (Signed)
Pt aware.

## 2021-05-20 DIAGNOSIS — M545 Low back pain, unspecified: Secondary | ICD-10-CM | POA: Diagnosis not present

## 2021-05-20 DIAGNOSIS — M542 Cervicalgia: Secondary | ICD-10-CM | POA: Diagnosis not present

## 2021-05-31 ENCOUNTER — Other Ambulatory Visit: Payer: Self-pay | Admitting: Family Medicine

## 2021-06-04 ENCOUNTER — Ambulatory Visit: Payer: PPO | Admitting: Internal Medicine

## 2021-06-04 ENCOUNTER — Encounter: Payer: Self-pay | Admitting: Internal Medicine

## 2021-06-04 ENCOUNTER — Other Ambulatory Visit: Payer: Self-pay

## 2021-06-04 VITALS — BP 118/74 | HR 78 | Temp 97.9°F | Ht 62.0 in | Wt 155.6 lb

## 2021-06-04 DIAGNOSIS — J4550 Severe persistent asthma, uncomplicated: Secondary | ICD-10-CM | POA: Diagnosis not present

## 2021-06-04 DIAGNOSIS — G8929 Other chronic pain: Secondary | ICD-10-CM | POA: Diagnosis not present

## 2021-06-04 DIAGNOSIS — K449 Diaphragmatic hernia without obstruction or gangrene: Secondary | ICD-10-CM

## 2021-06-04 DIAGNOSIS — B4481 Allergic bronchopulmonary aspergillosis: Secondary | ICD-10-CM | POA: Diagnosis not present

## 2021-06-04 DIAGNOSIS — R519 Headache, unspecified: Secondary | ICD-10-CM

## 2021-06-04 NOTE — Progress Notes (Signed)
OV 10/06/2016  Chief Complaint  Patient presents with   Follow-up    Pt states her asthma is good; had one flare up since last visit. SOB with exertion, cough with yellow to brown mucus prodcution, with frequent wheezing. Notice improvement with being on [employer]      65 year old female with severe asthma with ABPA and elevated IgE and eosinophilia. She is on IL5RAb antibody treatment now for the last few months. She feels this is helping her significantly. Her symptoms course of better. She is on Asmanex, Spiriva, Singulair as well. She is not on daily prednisone anymore. In December 2017 should she did grow MRSA from sputum culture. Currently a few days ago she developed sore throat and having some sputum production. She is worried this might be MRSA. Also asthma itself is stable and well controlled according to history. She is wondering about getting a sputum culture and really wishes for it    OV 01/11/2017  Chief Complaint  Patient presents with   Follow-up    Pt states her breathing is doing well. Pt states the last couple days she has had more wheezing. Pt states she has noticed an improvement being on Cinqair.    65 year old female with severe asthma with ABPA and elevated IgE and eosinophilia. She is on IL5RAb antibody treatment.  She is on Asmanex, Spiriva, Singulair as well. She is not on daily prednisone anymore   01/11/2017 - Here for routine follow-up. She is on interleukin-5 receptor antibody IV infusion therapy. This was held a few weeks ago because of asthma exacerbation. She says approximately on Father's Day 2018 he started feeling unwell and then ended up with an exacerbation for which she needed antibiotics and prednisone. She is surprised by this decompensation despite being on this biologic immunotherapy for asthma. She is compliant with her Asmanex, Spiriva and Singulair. She is not on prednisone. Currently she feels baseline. Patient infusion #6 or 7 is due for  tomorrow [interleukin-5 receptor antibody). Overall she feels improved quality of life. Despite all this exhaled nitric oxide continues to be HIGH; unclear why   OV 04/21/2017  Chief Complaint  Patient presents with   Follow-up    Pt states that she has been doing good. Has had no flare-ups with her asthma. Mild coughing at night. Denies any SOB or CP.  severe asthma with ABPA and elevated IgE and eosinophilia. She is on IL5RAb antibody treatment.  She is on Asmanex, Spiriva, Singulair as well. She is not on daily prednisone anymore  Continues cinqair spiriva, singulair, and asmanex. At this point in time the interleukin-5 receptor antibody is working really well for her. For nitric oxide today is lowest ever.. In terms of his symptoms she hardly ever gets up at night. When she wakes up she does not have any symptoms. She only has slight limitation with physical activity. She's not shortness of breath because of asthma not wheezing and not using albuterol for rescue use. The asthma control score is 0.8. However she is extremely frustrated by the [facility] gravida cycle management of her interleukin-5 receptor antibody infusions. She is getting different bills wrong bills missed bills. Therefore she wants to switch to subcutaneous injectable therapy. She is very upset about the quality of care that she has received at the infusion center because of billing practices.  OV 10/19/2017  Chief Complaint  Patient presents with   Follow-up    PFT done today. Pt states she has  been doing well. Has had a little more coughing and wheezing recently and thinks it could be due to the weather. Pt just got started back on Fasenra injections.   Severe persistent asthma with ABPA elevated IgE and eosinophilia. S/P cinqair interleukin-5 receptor antibody treatment ending" 2018 with good response but stopped secondary to billing errors at [facility] infusion center. Based on medical need then switched  to anti-eosinophil interleukin-5 receptor antibody subcutaneous injection treatment Fasenra early 2019. She is also on baseline Asmanex, Spiriva, Singulair and antihistamine 8. She is not on daily prednisone anymore.  Last seen November 2018. Overall she was doing well with good effort tolerance but in March 2019 had some atypical chest painand ended up with a coronary artery stent in the abuse marginal. She is improved from that. She is wondering about taking a beta blocker safely. She does not feel that his anemia because of blood pressure is low but apparently cardiology is asked her to clear this with me. In the interim her IV injection interleukin-5 receptor antibody has been changed to subcutaneous injection FASENRA . Because of her cardiac issues she's only taken 3 doses since January 2019. She hopes to go back on schedule with this. She had billing issues at the [facility] infusion center with the IV infusion and that is why we switched to the subcutaneous injection. However, it looks like the workflow authorization for this was not done correctly and she isn't of the large co-pay. She is unhappy about this and routinely discussed this with the patient. In terms of asthma she feels her cough is decompensated in the last few weeks and this correlates with her not taking her Asmanex. Her lung function test is near normal today. Her exhaled nitric oxide is slightly elevated than the lowest that she's had but still better than her baseline average. Currently she is waking up at nightcoughing because of the spring pollen but only hardly. Very mild symptoms when she wakes up she is slightly limited in the daytime and she experiences shortness of breath little of the time not using albuterol for rescue.    Results for Victoria [name], Victoria [name] (MRN [id number]) as of 10/19/2017 11:51  Ref. Range 10/19/2017 10:47  FEV1-Post Latest Units: L 1.80  FEV1-%Pred-Post Latest Units: % 76  FEV1-%Change-Post Latest Units: % 0   Results for Victoria [name], Victoria [name] (MRN [id number]) as of 10/19/2017 11:51  Ref. Range 10/19/2017 10:47  TLC Latest Units: L 4.90  TLC % pred Latest Units: % 103  Results for Victoria [name], Victoria [name] (MRN [id number]) as of 10/19/2017 11:51  Ref. Range 10/19/2017 10:47  DLCO unc Latest Units: ml/min/mmHg 19.51  DLCO unc % pred Latest Units: % 90    OV 05/14/2018  Subjective:  Patient ID: Victoria [name], female , DOB: 02/02/1956 , age 34 y.o. , MRN: [id number] , ADDRESS: [place] [address]   05/14/2018 -   Chief Complaint  Patient presents with   Follow-up    breathing good, SOB on exertion   Severe persistent asthma with ABPA elevated IgE and eosinophilia. S/P cinqair interleukin-5 receptor antibody treatment ending" 2018 with good response but stopped secondary to billing errors at [facility] infusion center. Based on medical need then switched to anti-eosinophil interleukin-5 receptor antibody subcutaneous injection treatment Fasenra early 2019. She is also on baseline Asmanex, Spiriva, Singulair and antihistamine 8. She is not on daily prednisone anymore.  Known hiatal hernia  HPI BALI LYN 65 y.o. -returns for follow-up  of severe persistent asthma.  Earlier this year we switched her to interleukin-5 receptor antibody subcutaneous injection may be [employer] called Fasenra.  With this she feels her asthma symptoms have improved dramatically.  She feels it is better than seeing care.  She still thankful to the scientist of developed this biologic.  Her exam nitric oxide is the lowest ever at 27.  She is not having any nighttime symptoms or any wheezing.  In fact she stopped her inhaled steroid partly because she was feeling well but partly also because she is in the donut hole.  However she is continuing her Spiriva and Singulair.  She is up-to-date with her flu shot.    She is dealing with skin infections.  Primary care physician notes reviewed.  There is question whether this is  being precipitated by the fact she is on to Biologics.  She thinks she had MRSA but when I reviewed the chart on May 08, 2018 shows MSSA.  Therefore she is been started on Augmentin today which she is yet to start.  She also has hiatal hernia.  She has not seen GI in a few years.  It is fairly well-controlled and she plans to see GI soon.  OV 02/14/2019  Subjective:  Patient ID: Victoria [name], female , DOB: 1955-11-25 , age 43 y.o. , MRN: [id number] , ADDRESS: [place] [address]   02/14/2019 -   Chief Complaint  Patient presents with   Severe persistent asthma with intensive monitoring   Severe persistent asthma with ABPA elevated IgE and eosinophilia. S/P cinqair interleukin-5 receptor antibody treatment ending" 2018 with good response but stopped secondary to billing errors at [facility] infusion center. Based on medical need then switched to anti-eosinophil interleukin-5 receptor antibody subcutaneous injection treatment Fasenra early 2019. She is  NOT on baseline Asmanex, Spiriva, Singulair and antihistamine 8. She is not on daily prednisone anymore.  Known hiatal hernia  HPI Victoria [name] 65 y.o. -is here for follow-up of asthma with ABPA.  Since her last visit she is only on biologic Fasenra.  She has stopped Singulair, Spiriva, Asmanex.  She is not taking daily prednisone.  Asthma is well controlled.  A few weeks ago she did have a itchy feeling intake epinephrine.  She also had wheeze at this time.  This helped control the situation.  Other than that she is doing well asthma is extremely well controlled.  No nocturnal awakenings no albuterol rescue use.  We are unable to do exhaled nitric oxide testing today because of the pandemic.  She told me that she wants to remove budesonide out of her allergy list because she is only allergic to [facility] and not the nebulizer.  She is doing social distancing.  She had an appointment earlier in the year with GI because of her  hiatal hernia but because of the pandemic she did not follow through.  She is open to having this appointment and referral made again.    OV 05/04/2020  Subjective:  Patient ID: Victoria [name], female , DOB: 10/31/1955 , age 82 y.o. , MRN: [id number] , ADDRESS: [place] [address] PCP Mosie Lukes, MD Patient Care Team: Mosie Lukes, MD as PCP - General (Family Medicine) Dorothy Spark, MD as PCP - Cardiology (Cardiology) Haverstock, Jennefer Bravo, MD as Referring Physician (Dermatology) Brand Males, MD as Consulting Physician (Pulmonary Disease) Dorothy Spark, MD as Consulting Physician (Cardiology) Dian Queen, MD as Consulting  Physician (Obstetrics and Gynecology) Melissa Montane, MD as Consulting Physician (Otolaryngology) Pyrtle, Lajuan Lines, MD as Consulting Physician (Gastroenterology) Pieter Partridge, DO as Consulting Physician (Neurology)  This Provider for this visit: Treatment Team:  Attending Provider: Brand Males, MD    05/04/2020 -   Chief Complaint  Patient presents with   Follow-up    Pt c/o cough since 1 day ago- prod with yellow sputum. She has also been wheezing some. She uses her duoneb 4-6 x per wk.      HPI Victoria [name] 65 y.o. -presents for follow-up of a complicated asthma with ABPA.  She has been overall doing well with [name].  However in September 2021 after being exposed to someone with Covid she had respiratory exacerbation but she was ruled out for Covid.  She took 5 days of prednisone and did well but she had a cough that lingered through October 2021.  Then she developed chest pains.  We did a CT angiogram that ruled out PE.  I was involved in the decision making.  It only showed hiatal hernia.  I personally visualized that CT again.  She can got worried that the [name] is not working because of that one respiratory exacerbation and the lingering cough.  In addition she tells me that yesterday while eating  some crackers she choked and today she is wheezing.  She is noted to be on alendronate for the last 2 years.  This is by primary care physician.  In the spring 2020 when she did see GI Dr. Hilarie Fredrickson and interventional endoscopy has been recommended but she is so far holding off on that.  Her acid reflux regimen has been escalated.  She is up-to-date with her vaccine but is inquiring about Covid booster timing.   Asthma Control Test ACT Total Score  05/04/2020 19     Lab Results  Component Value Date   NITRICOXIDE 42 04/21/2017     OV 06/04/2021  Subjective:  Patient ID: Victoria [name], female , DOB: 1955/12/19 , age 11 y.o. , MRN: [id number] , ADDRESS: [address] [place] [id number] PCP Mosie Lukes, MD Patient Care Team: Mosie Lukes, MD as PCP - General (Family Medicine) Dorothy Spark, MD (Inactive) as PCP - Cardiology (Cardiology) Haverstock, Jennefer Bravo, MD as Referring Physician (Dermatology) Brand Males, MD as Consulting Physician (Pulmonary Disease) Dorothy Spark, MD (Inactive) as Consulting Physician (Cardiology) Dian Queen, MD as Consulting Physician (Obstetrics and Gynecology) Melissa Montane, MD as Consulting Physician (Otolaryngology) Pyrtle, Lajuan Lines, MD as Consulting Physician (Gastroenterology) Pieter Partridge, DO as Consulting Physician (Neurology)  This Provider for this visit: Treatment Team:  Attending Provider: Brand Males, MD    06/04/2021 -   Chief Complaint  Patient presents with   Follow-up    Pt states she has been doing okay since last visit. States she has had sinus pressure and also some pain in sinuses.    Severe persistent asthma with ABPA elevated IgE and eosinophilia. S/P cinqair interleukin-5 receptor antibody treatment ending" 2018 with good response but stopped secondary to billing errors at [facility] infusion center. Based on medical need then switched to anti-eosinophil interleukin-5 receptor antibody  subcutaneous injection treatment Fasenra early 2019. She is  NOT on baseline Asmanex, Spiriva, Singulair and antihistamine 8. She is not on daily prednisone anymore.  Known hiatal hernia  HPI Victoria [name] 66 y.o. -returns for follow-up.  Is a routine follow-up.  At this point in time she  is on biologic Fasenra.  She is not taking any of her inhalers or Singulair.  She did see Tammy.  In the summer 2022 had an exacerbation had to go on prednisone.  Personally saw her a year ago.  Currently asthma is well controlled.  She says she has intermittent wheezing and is wondering if she should be on a maintenance inhaler.  She asked me about evidence in literature about monotherapy with [name].  Explained to her that Biologics was studied as add-on therapy and she needs to be on a maintenance therapy.  She is agreeable to go back on her Asmanex.  This because she does have some occasional wheezing. ACT score shows good control  Of note she is having chronic headaches.  She has been on [place] since 2019.  Prior to that she was started on Repatha.  She is also on Prolia These can cause headaches.    She had CT angio which shows stable hiatale hernia.   Asthma Control Test ACT Total Score  06/04/2021 23  03/17/2021 16  05/04/2020 19    Lab Results  Component Value Date   NITRICOXIDE 42 04/21/2017      Results for Victoria [name], Victoria [name] (MRN [id number]) as of 05/14/2018 10:52  Ref. Range 05/05/2015 13:45 07/17/2015 12:31 11/24/2015 12:31 10/06/2016 12:00 01/11/2017 12:54 04/21/2017 11:02 05/14/2018   Nitric Oxide Unknown 80 80 126 74 87 42 27    CT Chest data  No results found.    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), Anemia, Anxiety, Asthma, CAD (coronary artery disease), CAP (community acquired pneumonia) (2016; 06/07/2016), Chronic bronchitis ([place]), Chronic lower back pain, Complication of anesthesia, COPD (chronic obstructive pulmonary disease) ([place]), Depression, Diverticulitis, Diverticulosis, GERD (gastroesophageal reflux disease), H/O hiatal hernia, Headache, History of echocardiogram, Hyperglycemia (11/20/2015), Hyperlipidemia, mixed (09/11/2007), IBS (irritable bowel syndrome), Maxillary sinusitis, Normal cardiac stress test (11/2011), Obesity, OSA (obstructive sleep apnea) (02/2012), Osteoarthritis, Osteoporosis, Pneumonia (11/2011), Pulmonary nodules, S/P angioplasty with stent 08/23/17 ostial 2nd OM with DES synnergy (08/24/2017), and Schatzki's ring.   reports that she has never smoked. She has never used smokeless tobacco.  Past Surgical History:  Procedure Laterality Date   [place] N/A 11/25/2014   Procedure: Right/Left Heart Cath and Coronary Angiography;  Surgeon: Belva Crome, MD;  Location: [place] [facility];  Service: Cardiovascular;  Laterality: N/A;   [place] WITH STENT PLACEMENT  08/23/2017   CORONARY STENT INTERVENTION N/A 08/23/2017   Procedure: CORONARY STENT INTERVENTION;  Surgeon: Burnell Blanks, MD;  Location: [place] [facility];  Service: Cardiovascular;  Laterality: N/A;   HERNIA REPAIR  04/13/2012   VHR laparoscopic   LEFT HEART CATH AND CORONARY ANGIOGRAPHY N/A 08/23/2017   Procedure: LEFT HEART CATH AND CORONARY ANGIOGRAPHY;  Surgeon: Burnell Blanks, MD;  Location: [place] [facility];  Service: Cardiovascular;  Laterality: N/A;   VENTRAL HERNIA  REPAIR  04/13/2012   Procedure: LAPAROSCOPIC VENTRAL HERNIA;  Surgeon: Adin Hector, MD;  Location: [place];   Service: General;  Laterality: N/A;  laparoscopic repair of incarcerated hernia    Allergies  Allergen Reactions   Beclomethasone Dipropionate Hives and Other (See Comments)     weight gain   Flexeril [Cyclobenzaprine] Anxiety   Mometasone Furo-Formoterol Fum Hives and Other (See Comments)    weight gain   Sulfonamide Derivatives Hives and Rash   Statins     Myalgias, RLS    Immunization History  Administered Date(s) Administered   Influenza Split 04/20/2011, 04/20/2020   Influenza Whole 06/06/2007, 04/15/2008, 04/02/2009, 03/29/2012   Influenza, High Dose Seasonal PF 05/05/2015   Influenza,inj,Quad PF,6+ Mos 05/09/2013, 03/03/2014, 05/03/2016, 04/21/2017, 04/02/2018, 03/19/2019, 04/20/2020, 05/03/2021   Moderna Sars-Covid-2 Vaccination 08/15/2019, 09/17/2019, 05/16/2020, 12/04/2020   Pfizer Covid-19 Vaccine Bivalent Booster 10yrs & up 04/16/2021   Pneumococcal Conjugate-13 05/09/2013   Pneumococcal Polysaccharide-23 05/04/2005   Td 07/29/2009   Tdap 03/11/2015   Zoster Recombinat (Shingrix) 05/09/2019, 08/01/2019    Family History  Problem Relation Age of Onset   Breast cancer Mother    Hypertension Mother    Diabetes Mother    Cancer Mother        recurrent, metastatic breast cancer.    Diverticulosis Father    Prostate cancer Father    Pulmonary embolism Brother        recurrent   Heart attack Maternal Grandfather    Breast cancer Sister    Cancer Sister        breast cancer, invasive ductal carcinoma in 2022,DCIS at 60 with 4 weeks of radiation, 5 years of Tamoxifen    Stroke Neg Hx    Colon cancer Neg Hx    Esophageal cancer Neg Hx    Stomach cancer Neg Hx    Rectal cancer Neg Hx      Current Outpatient Medications:    acyclovir ointment (ZOVIRAX) 5 %, Apply 1 application topically every 3 (three) hours., Disp: 15 g, Rfl: 2   albuterol (VENTOLIN HFA) 108 (90 Base) MCG/ACT inhaler, Inhale 1-2 puffs into the lungs every 6 (six) hours as needed., Disp: 8 g,  Rfl: 2   aspirin 81 MG tablet, Take 81 mg by mouth daily., Disp: , Rfl:    Benralizumab (FASENRA PEN) 30 MG/ML SOAJ, Inject 30 mg into the skin every 8 (eight) weeks., Disp: 1 mL, Rfl: 5   benzonatate (TESSALON) 200 MG capsule, Take 1 capsule (200 mg total) by mouth 3 (three) times daily as needed for cough., Disp: 30 capsule, Rfl: 1   Bioflavonoid Products (ESTER C PO), Take 500 mg by mouth daily., Disp: , Rfl:    Calcium-Magnesium-Vitamin D 300-150-400 MG-MG-UNIT TABS, Take 1 tablet by mouth daily., Disp: , Rfl:    denosumab (PROLIA) 60 MG/ML SOSY injection, Inject 60 mg into the skin every 6 (six) months., Disp: , Rfl:    Evolocumab (REPATHA SURECLICK) 932 MG/ML SOAJ, INJECT 1 PEN INTO THE SKIN EVERY 14 DAYS, Disp: 6 mL, Rfl: 3   famotidine (PEPCID) 20 MG tablet, TAKE 1 TABLET (20 MG TOTAL) BY MOUTH AT BEDTIME., Disp: 30 tablet, Rfl: 3   ipratropium-albuterol (DUONEB) 0.5-2.5 (3) MG/3ML SOLN, ipratropium 0.5 mg-albuterol 3 mg (2.5 mg base)/3 mL nebulization soln  USE 1 VIAL BY NEBULIZATION EVERY 6 (SIX) HOURS AS NEEDED., Disp: , Rfl:    LORazepam (ATIVAN) 1 MG tablet, TAKE 1/2 TO 1 TABLET BY MOUTH 2 TIMES A DAY AS  NEEDED FOR ANXIETY OR SLEEP, Disp: 30 tablet, Rfl: 2   MAGNESIUM GLYCINATE PO, Take 400 mg by mouth daily., Disp: , Rfl:    MODERNA COVID-19 VACCINE 100 MCG/0.5ML injection, , Disp: , Rfl:    nitroGLYCERIN (NITROSTAT) 0.4 MG SL tablet, PLACE 1 TABLET UNDER THE TONGUE EVERY 5 MINUTES AS NEEDED FOR CHEST PAIN., Disp: 25 tablet, Rfl: 4   venlafaxine XR (EFFEXOR-XR) 150 MG 24 hr capsule, Take 1 capsule (150 mg total) by mouth daily with breakfast. Take with 37.5 mg cap daily, Disp: 90 capsule, Rfl: 0   venlafaxine XR (EFFEXOR-XR) 37.5 MG 24 hr capsule, TAKE 1 CAPSULE (37.5 MG TOTAL) BY MOUTH DAILY WITH BREAKFAST., Disp: 30 capsule, Rfl: 1   VITAMIN D PO, Take 5,000 Units by mouth daily., Disp: , Rfl:    ZINC CITRATE PO, Take 30 mg by mouth daily., Disp: , Rfl:       Objective:    Vitals:   06/04/21 1037  BP: 118/74  Pulse: 78  Temp: 97.9 F (36.6 C)  TempSrc: Oral  SpO2: 100%  Weight: 155 lb 9.6 oz (70.6 kg)  Height: 5\' 2"  (1.575 m)    Estimated body mass index is 28.46 kg/m as calculated from the following:   Height as of this encounter: 5\' 2"  (1.575 m).   Weight as of this encounter: 155 lb 9.6 oz (70.6 kg).  @WEIGHTCHANGE @  Filed Weights   06/04/21 1037  Weight: 155 lb 9.6 oz (70.6 kg)     Physical Exam    General: No distress. Looks well Neuro: Alert and Oriented x 3. GCS 15. Speech normal Psych: Pleasant Resp:  Barrel Chest - no.  Wheeze - no, Crackles - no, No overt respiratory distress CVS: Normal heart sounds. Murmurs - no Ext: Stigmata of Connective Tissue Disease - SCOLIOSIS baseline HEENT: Normal upper airway. PEERL +. No post nasal drip        Assessment:       ICD-10-CM   1. Severe persistent asthma with intensive monitoring  J45.50     2. A B P A-ALLERGIC BRONCHOPULMONARY ASPERGILLOSIS  B44.81     3. Hiatal hernia  K44.9     4. Chronic nonintractable headache, unspecified headache type  R51.9    G89.29          Plan:     Patient Instructions   Severe persistent asthma with intensive monitoring A B P A-ALLERGIC BRONCHOPULMONARY ASPERGILLOSIS Moderate persistent asthmatic bronchitis with acute exacerbation  -At this point in time there is no evidence of Fasenra failure.  Asthma is well controlled  Plan -continue with Fasenra and  retstart asmanex 2 puff twice daily -Certainly if you are having breakthrough respiratory exacerbations or need to go back on prednisone on a daily basis we can consider a newer biologic called  TEPEZULMAB  Headaches  - per literature review  11% with Repatha, 8% with fasenra and 4% with prolia  Plan  - please talk to cardiology and see if you can do time limited trial without Repatha  Hiatal hernia  Stable on CT chest oct 2022 Glad you are off fosfamax  Plan -per pcp  Mosie Lukes, MD and Dr Levie Heritage     Followup 3 months or sooner if needed    SIGNATURE    Dr. Brand Males, M.D., F.C.C.P,  Pulmonary and Critical Care Medicine Staff Physician, [place] Director - Interstitial Lung Disease  Program  Pulmonary [place] at  [place], [place], [address]  Pager: [phone], If no answer or between  15:00h - 7:00h: call [phone] Telephone: [phone]  11:33 AM 06/04/2021

## 2021-06-04 NOTE — Patient Instructions (Addendum)
°  Severe persistent asthma with intensive monitoring A B P A-ALLERGIC BRONCHOPULMONARY ASPERGILLOSIS Moderate persistent asthmatic bronchitis with acute exacerbation  -At this point in time there is no evidence of Fasenra failure.  Asthma is well controlled  Plan -continue with Fasenra and  retstart asmanex 2 puff twice daily -Certainly if you are having breakthrough respiratory exacerbations or need to go back on prednisone on a daily basis we can consider a newer biologic called  TEPEZULMAB  Headaches  - per literature review  11% with Repatha, 8% with fasenra and 4% with prolia  Plan  - please talk to cardiology and see if you can do time limited trial without Repatha  Hiatal hernia  Stable on CT chest oct 2022 Glad you are off fosfamax  Plan -per pcp Mosie Lukes, MD and Dr Levie Heritage     Followup 3 months or sooner if needed

## 2021-06-08 ENCOUNTER — Telehealth: Payer: PPO | Admitting: Family Medicine

## 2021-07-03 ENCOUNTER — Telehealth: Payer: Self-pay | Admitting: Nurse Practitioner

## 2021-07-03 ENCOUNTER — Other Ambulatory Visit: Payer: Self-pay | Admitting: Nurse Practitioner

## 2021-07-03 MED ORDER — AMOXICILLIN-POT CLAVULANATE 875-125 MG PO TABS: 1.0000 | ORAL_TABLET | Freq: Two times a day (BID) | ORAL | 0 refills | Status: AC

## 2021-07-03 NOTE — Telephone Encounter (Signed)
Patient called answering service. Haing LLQ discomfort for a few weeks reminiscent of diverticulitis. In the last 24 hours she has developed worsening pain and now involving LLQ as well. Temp up to 100.9. No cough or other complaints. Has appt with me later this month.   Up to date on colonoscopy done June 2021. Reviewed CTAP scan done Jan 2022 >> probable diverticulitis / wall thickening from diverticular disease / or SCAD.   Recc: Clear liquid diet until improving on  Augmentin 875 mg BID x 7 days To ED in not improving in 24-48 hours. Otherwise will see at follow up appt

## 2021-07-06 ENCOUNTER — Ambulatory Visit (INDEPENDENT_AMBULATORY_CARE_PROVIDER_SITE_OTHER): Payer: PPO | Admitting: Family Medicine

## 2021-07-06 ENCOUNTER — Encounter: Payer: Self-pay | Admitting: Family Medicine

## 2021-07-06 VITALS — BP 122/74 | HR 112 | Temp 98.5°F | Resp 16 | Ht 62.0 in | Wt 154.2 lb

## 2021-07-06 DIAGNOSIS — M810 Age-related osteoporosis without current pathological fracture: Secondary | ICD-10-CM | POA: Diagnosis not present

## 2021-07-06 DIAGNOSIS — K5792 Diverticulitis of intestine, part unspecified, without perforation or abscess without bleeding: Secondary | ICD-10-CM

## 2021-07-06 DIAGNOSIS — I2583 Coronary atherosclerosis due to lipid rich plaque: Secondary | ICD-10-CM | POA: Diagnosis not present

## 2021-07-06 DIAGNOSIS — D509 Iron deficiency anemia, unspecified: Secondary | ICD-10-CM

## 2021-07-06 DIAGNOSIS — I2510 Atherosclerotic heart disease of native coronary artery without angina pectoris: Secondary | ICD-10-CM | POA: Diagnosis not present

## 2021-07-06 DIAGNOSIS — J4550 Severe persistent asthma, uncomplicated: Secondary | ICD-10-CM | POA: Diagnosis not present

## 2021-07-06 DIAGNOSIS — E782 Mixed hyperlipidemia: Secondary | ICD-10-CM

## 2021-07-06 DIAGNOSIS — F419 Anxiety disorder, unspecified: Secondary | ICD-10-CM

## 2021-07-06 DIAGNOSIS — Z0000 Encounter for general adult medical examination without abnormal findings: Secondary | ICD-10-CM

## 2021-07-06 DIAGNOSIS — F32A Depression, unspecified: Secondary | ICD-10-CM

## 2021-07-06 DIAGNOSIS — Z789 Other specified health status: Secondary | ICD-10-CM

## 2021-07-06 DIAGNOSIS — R002 Palpitations: Secondary | ICD-10-CM

## 2021-07-06 DIAGNOSIS — R739 Hyperglycemia, unspecified: Secondary | ICD-10-CM | POA: Diagnosis not present

## 2021-07-06 DIAGNOSIS — G720 Drug-induced myopathy: Secondary | ICD-10-CM

## 2021-07-06 NOTE — Patient Instructions (Signed)
Preventive Care 40-64 Years Old, Female °Preventive care refers to lifestyle choices and visits with your health care provider that can promote health and wellness. Preventive care visits are also called wellness exams. °What can I expect for my preventive care visit? °Counseling °Your health care provider may ask you questions about your: °Medical history, including: °Past medical problems. °Family medical history. °Pregnancy history. °Current health, including: °Menstrual cycle. °Method of birth control. °Emotional well-being. °Home life and relationship well-being. °Sexual activity and sexual health. °Lifestyle, including: °Alcohol, nicotine or tobacco, and drug use. °Access to firearms. °Diet, exercise, and sleep habits. °Work and work environment. °Sunscreen use. °Safety issues such as seatbelt and bike helmet use. °Physical exam °Your health care provider will check your: °Height and weight. These may be used to calculate your BMI (body mass index). BMI is a measurement that tells if you are at a healthy weight. °Waist circumference. This measures the distance around your waistline. This measurement also tells if you are at a healthy weight and may help predict your risk of certain diseases, such as type 2 diabetes and high blood pressure. °Heart rate and blood pressure. °Body temperature. °Skin for abnormal spots. °What immunizations do I need? °Vaccines are usually given at various ages, according to a schedule. Your health care provider will recommend vaccines for you based on your age, medical history, and lifestyle or other factors, such as travel or where you work. °What tests do I need? °Screening °Your health care provider may recommend screening tests for certain conditions. This may include: °Lipid and cholesterol levels. °Diabetes screening. This is done by checking your blood sugar (glucose) after you have not eaten for a while (fasting). °Pelvic exam and Pap test. °Hepatitis B test. °Hepatitis C  test. °HIV (human immunodeficiency virus) test. °STI (sexually transmitted infection) testing, if you are at risk. °Lung cancer screening. °Colorectal cancer screening. °Mammogram. Talk with your health care provider about when you should start having regular mammograms. This may depend on whether you have a family history of breast cancer. °BRCA-related cancer screening. This may be done if you have a family history of breast, ovarian, tubal, or peritoneal cancers. °Bone density scan. This is done to screen for osteoporosis. °Talk with your health care provider about your test results, treatment options, and if necessary, the need for more tests. °Follow these instructions at home: °Eating and drinking ° °Eat a diet that includes fresh fruits and vegetables, whole grains, lean protein, and low-fat dairy products. °Take vitamin and mineral supplements as recommended by your health care provider. °Do not drink alcohol if: °Your health care provider tells you not to drink. °You are pregnant, may be pregnant, or are planning to become pregnant. °If you drink alcohol: °Limit how much you have to 0-1 drink a day. °Know how much alcohol is in your drink. In the [place] one drink equals one 12 oz bottle of beer (355 mL), one 5 oz glass of wine (148 mL), or one 1½ oz glass of hard liquor (44 mL). °Lifestyle °Brush your teeth every morning and night with fluoride toothpaste. Floss one time each day. °Exercise for at least 30 minutes 5 or more days each week. °Do not use any products that contain nicotine or tobacco. These products include cigarettes, chewing tobacco, and vaping devices, such as e-cigarettes. If you need help quitting, ask your health care provider. °Do not use drugs. °If you are sexually active, practice safe sex. Use a condom or other form of protection to prevent   STIs. °If you do not wish to become pregnant, use a form of birth control. If you plan to become pregnant, see your health care provider for a  prepregnancy visit. °Take aspirin only as told by your health care provider. Make sure that you understand how much to take and what form to take. Work with your health care provider to find out whether it is safe and beneficial for you to take aspirin daily. °Find healthy ways to manage stress, such as: °Meditation, yoga, or listening to music. °Journaling. °Talking to a trusted person. °Spending time with friends and family. °Minimize exposure to UV radiation to reduce your risk of skin cancer. °Safety °Always wear your seat belt while driving or riding in a vehicle. °Do not drive: °If you have been drinking alcohol. Do not ride with someone who has been drinking. °When you are tired or distracted. °While texting. °If you have been using any mind-altering substances or drugs. °Wear a helmet and other protective equipment during sports activities. °If you have firearms in your house, make sure you follow all gun safety procedures. °Seek help if you have been physically or sexually abused. °What's next? °Visit your health care provider once a year for an annual wellness visit. °Ask your health care provider how often you should have your eyes and teeth checked. °Stay up to date on all vaccines. °This information is not intended to replace advice given to you by your health care provider. Make sure you discuss any questions you have with your health care provider. °Document Revised: 12/02/2020 Document Reviewed: 12/02/2020 °Elsevier Patient Education © 2022 [employer] Inc. ° °

## 2021-07-07 DIAGNOSIS — K5792 Diverticulitis of intestine, part unspecified, without perforation or abscess without bleeding: Secondary | ICD-10-CM | POA: Insufficient documentation

## 2021-07-07 LAB — LIPID PANEL
Cholesterol: 172 mg/dL (ref 0–200)
HDL: 55.3 mg/dL (ref >39.00)
LDL Cholesterol: 95 mg/dL (ref 0–99)
NonHDL: 117.17
Total CHOL/HDL Ratio: 3
Triglycerides: 110 mg/dL (ref 0.0–149.0)
VLDL: 22 mg/dL (ref 0.0–40.0)

## 2021-07-07 LAB — COMPREHENSIVE METABOLIC PANEL
ALT: 70 U/L — ABNORMAL HIGH (ref 0–35)
AST: 42 U/L — ABNORMAL HIGH (ref 0–37)
Albumin: 4.3 g/dL (ref 3.5–5.2)
Alkaline Phosphatase: 90 U/L (ref 39–117)
BUN: 11 mg/dL (ref 6–23)
CO2: 28 mEq/L (ref 19–32)
Calcium: 9.2 mg/dL (ref 8.4–10.5)
Chloride: 99 mEq/L (ref 96–112)
Creatinine, Ser: 0.58 mg/dL (ref 0.40–1.20)
GFR: 95.8 mL/min (ref >60.00)
Glucose, Bld: 83 mg/dL (ref 70–99)
Potassium: 4 mEq/L (ref 3.5–5.1)
Sodium: 136 mEq/L (ref 135–145)
Total Bilirubin: 0.3 mg/dL (ref 0.2–1.2)
Total Protein: 6.6 g/dL (ref 6.0–8.3)

## 2021-07-07 LAB — CBC
HCT: 38.1 % (ref 36.0–46.0)
Hemoglobin: 12.7 g/dL (ref 12.0–15.0)
MCHC: 33.3 g/dL (ref 30.0–36.0)
MCV: 92.5 fl (ref 78.0–100.0)
Platelets: 315 10*3/uL (ref 150.0–400.0)
RBC: 4.12 Mil/uL (ref 3.87–5.11)
RDW: 12.6 % (ref 11.5–15.5)
WBC: 4.9 10*3/uL (ref 4.0–10.5)

## 2021-07-07 LAB — IRON,TIBC AND FERRITIN PANEL
%SAT: 16 % (calc) (ref 16–45)
Ferritin: 277 ng/mL (ref 16–288)
Iron: 41 ug/dL — ABNORMAL LOW (ref 45–160)
TIBC: 261 mcg/dL (calc) (ref 250–450)

## 2021-07-07 LAB — TSH: TSH: 1.82 u[IU]/mL (ref 0.35–5.50)

## 2021-07-07 LAB — MAGNESIUM: Magnesium: 2.1 mg/dL (ref 1.5–2.5)

## 2021-07-07 LAB — T4, FREE: Free T4: 0.85 ng/dL (ref 0.60–1.60)

## 2021-07-07 LAB — HEMOGLOBIN A1C: Hgb A1c MFr Bld: 5.5 % (ref 4.6–6.5)

## 2021-07-07 NOTE — Assessment & Plan Note (Signed)
Patient encouraged to maintain heart healthy diet, regular exercise, adequate sleep. Consider daily probiotics. Take medications as prescribed. Labs ordered and reviewed. MM August 2022 repeat in 1 year. Pap negative August 2021 repeat in 2024. Colonoscopy June 2021 repeat in 10 years.

## 2021-07-07 NOTE — Assessment & Plan Note (Signed)
Statin intolerant 

## 2021-07-07 NOTE — Assessment & Plan Note (Signed)
Doing well on Fasenra

## 2021-07-07 NOTE — Assessment & Plan Note (Signed)
Encouraged to get adequate exercise, calcium and vitamin d intake 

## 2021-07-07 NOTE — Progress Notes (Signed)
Subjective:    Patient ID: Victoria [name], female    DOB: [birth year], 66 y.o.   MRN: [id number]  Chief Complaint  Patient presents with   Annual Exam    HPI Patient is in today for annual preventative exam and follow-up on chronic medical concerns.  She has not felt well this past week.  Last week due to sore throat headache fevers to 100.9.  She also developed diarrhea and left-sided abdominal pain both lower quadrant and upper quadrant.  She ultimately was treated with Augmentin for abdominal pain and diverticulitis and he is doing better.  No fevers or chills at this time does have some persistent fatigue.  Sore throat is better but fatigue is persistent.  She does note some scratchy throat still persists.  She is concerned about the palpitations that started in the last 24 hours and she noted a quivering in her throat with the palpitations.  They are happening intermittently and last 2 to 5 minutes without associated symptoms.  She tries to maintain a heart healthy diet and stay active but does spend a good deal of her time caring for her adult son with special needs so is not always able to fully take care of herself. Denies CP/palp/SOB/HA/congestion or GU c/o. 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 LHC 6/16:  oOM1 60, pRCA 25 >> med Rx b. cath 3/19 2nd OM with 95% stenosis s/p synergy DES & anomalous RCA   CAP (community acquired pneumonia) 2016; 06/07/2016   Chronic bronchitis (HCC)    Chronic lower back pain    Complication of anesthesia    "think I have a hard time waking up from it"   COPD (chronic obstructive pulmonary disease) ([place])    Depression    mild   Diverticulitis    Diverticulosis    GERD (gastroesophageal reflux disease)    H/O hiatal hernia     Headache    "weekly" (08/23/2017)   History of echocardiogram    Echo 6/16:  Mod LVH, EF 60-65%, no RWMA, Gr 1 DD, trivial MR, normal LA size.   Hyperglycemia 11/20/2015   Hyperlipidemia, mixed 09/11/2007   Qualifier: Diagnosis of  By: Jerold Coombe   Did not tolerate Lipitor, zocor, Lovastatin, Pravastatin, Livalo, Crestor even low dose    IBS (irritable bowel syndrome)    Maxillary sinusitis    Normal cardiac stress test 11/2011   No evidence of ischemia or infarct.   Calculated ejection fraction 72%.   Obesity    OSA (obstructive sleep apnea) 02/2012   has stopped using  cpap   Osteoarthritis    Osteoporosis    Pneumonia 11/2011   "before 2013 I hadn't had pneumonia since I was a child" (04/13/2012)   Pulmonary nodules    S/P angioplasty with stent 08/23/17 ostial 2nd OM with DES synnergy 08/24/2017   Schatzki's ring     Past Surgical History:  Procedure Laterality Date   APPENDECTOMY  1989   CARDIAC CATHETERIZATION N/A 11/25/2014   Procedure: Right/Left Heart Cath and Coronary Angiography;  Surgeon: Belva Crome, MD;  Location: [facility];  Service: Cardiovascular;  Laterality: N/A;   [place]  PLACEMENT  08/23/2017   CORONARY STENT INTERVENTION N/A 08/23/2017   Procedure: CORONARY STENT INTERVENTION;  Surgeon: Burnell Blanks, MD;  Location: [facility];  Service: Cardiovascular;  Laterality: N/A;   HERNIA REPAIR  04/13/2012   VHR laparoscopic   LEFT HEART CATH AND CORONARY ANGIOGRAPHY N/A 08/23/2017   Procedure: LEFT HEART CATH AND CORONARY ANGIOGRAPHY;  Surgeon: Burnell Blanks, MD;  Location: [facility] [facility];  Service: Cardiovascular;  Laterality: N/A;   VENTRAL HERNIA REPAIR  04/13/2012   Procedure: LAPAROSCOPIC VENTRAL HERNIA;  Surgeon: Adin Hector, MD;  Location: [facility];  Service: General;  Laterality: N/A;  laparoscopic repair of incarcerated hernia    Family History  Problem Relation Age of  Onset   Breast cancer Mother    Hypertension Mother    Diabetes Mother    Cancer Mother        recurrent, metastatic breast cancer.    Diverticulosis Father    Prostate cancer Father    Breast cancer Sister    Cancer Sister        breast cancer, invasive ductal carcinoma in 2022,DCIS at 63 with 4 weeks of radiation, 5 years of Tamoxifen    Pulmonary embolism Brother        recurrent   Heart attack Maternal Grandfather    Cancer Niece 58   Osteoporosis Niece    Stroke Neg Hx    Colon cancer Neg Hx    Esophageal cancer Neg Hx    Stomach cancer Neg Hx    Rectal cancer Neg Hx     Social History   Socioeconomic History   Marital status: Married    Spouse name: Not on file   Number of children: 1   Years of education: 14   Highest education level: Associate degree: academic program  Occupational History   Occupation: Disabled   Tobacco Use   Smoking status: Never   Smokeless tobacco: Never  Scientific laboratory technician Use: Never used  Substance and Sexual Activity   Alcohol use: Yes    Comment: social use   Drug use: No   Sexual activity: Yes    Comment: gluten free, lives with husband and son with CP quadriplegia  Other Topics Concern   Not on file  Social History Narrative   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    Lives at home with her husband and son.   Right-handed.   2 cups caffeine per day.   One story home   Social Determinants of Health   Financial Resource Strain: Not on file  Food Insecurity: Not on file  Transportation Needs: Not on file  Physical Activity: Not on file  Stress: Not on file  Social Connections: Not on file  Intimate Partner Violence: Not on file    Outpatient Medications Prior to Visit  Medication Sig Dispense Refill   acyclovir ointment (ZOVIRAX) 5 % Apply 1 application topically every 3 (three) hours. 15 g 2   albuterol (VENTOLIN HFA) 108 (90 Base) MCG/ACT inhaler Inhale 1-2 puffs into the lungs every 6 (six) hours as needed. 8 g 2    amoxicillin-clavulanate (AUGMENTIN) 875-125 MG tablet Take 1 tablet by mouth 2 (two) times daily for 7 days. 20 tablet 0   aspirin 81 MG tablet Take 81 mg by mouth daily.     Benralizumab (FASENRA PEN) 30 MG/ML SOAJ Inject 30 mg into the skin every 8 (eight) weeks. 1 mL 5   Bioflavonoid Products (ESTER  C PO) Take 500 mg by mouth daily.     Calcium-Magnesium-Vitamin D 300-150-400 MG-MG-UNIT TABS Take 1 tablet by mouth daily.     denosumab (PROLIA) 60 MG/ML SOSY injection Inject 60 mg into the skin every 6 (six) months.     Evolocumab (REPATHA SURECLICK) 628 MG/ML SOAJ INJECT 1 PEN INTO THE SKIN EVERY 14 DAYS 6 mL 3   famotidine (PEPCID) 20 MG tablet TAKE 1 TABLET (20 MG TOTAL) BY MOUTH AT BEDTIME. 30 tablet 3   ipratropium-albuterol (DUONEB) 0.5-2.5 (3) MG/3ML SOLN ipratropium 0.5 mg-albuterol 3 mg (2.5 mg base)/3 mL nebulization soln  USE 1 VIAL BY NEBULIZATION EVERY 6 (SIX) HOURS AS NEEDED.     LORazepam (ATIVAN) 1 MG tablet TAKE 1/2 TO 1 TABLET BY MOUTH 2 TIMES A DAY AS NEEDED FOR ANXIETY OR SLEEP 30 tablet 2   MAGNESIUM GLYCINATE PO Take 400 mg by mouth daily.     nitroGLYCERIN (NITROSTAT) 0.4 MG SL tablet PLACE 1 TABLET UNDER THE TONGUE EVERY 5 MINUTES AS NEEDED FOR CHEST PAIN. 25 tablet 4   venlafaxine XR (EFFEXOR-XR) 150 MG 24 hr capsule Take 1 capsule (150 mg total) by mouth daily with breakfast. Take with 37.5 mg cap daily 90 capsule 0   venlafaxine XR (EFFEXOR-XR) 37.5 MG 24 hr capsule TAKE 1 CAPSULE (37.5 MG TOTAL) BY MOUTH DAILY WITH BREAKFAST. 30 capsule 1   VITAMIN D PO Take 5,000 Units by mouth daily.     ZINC CITRATE PO Take 30 mg by mouth daily.     benzonatate (TESSALON) 200 MG capsule Take 1 capsule (200 mg total) by mouth 3 (three) times daily as needed for cough. 30 capsule 1   MODERNA COVID-19 VACCINE 100 MCG/0.5ML injection      No facility-administered medications prior to visit.    Allergies  Allergen Reactions   Beclomethasone Dipropionate Hives and Other (See  Comments)     weight gain   Flexeril [Cyclobenzaprine] Anxiety   Mometasone Furo-Formoterol Fum Hives and Other (See Comments)    weight gain   Sulfonamide Derivatives Hives and Rash   Statins     Myalgias, RLS    Review of Systems  Constitutional:  Positive for fever and malaise/fatigue. Negative for chills.  HENT:  Positive for sore throat. Negative for congestion and hearing loss.   Eyes:  Negative for discharge.  Respiratory:  Negative for cough, sputum production and shortness of breath.   Cardiovascular:  Negative for chest pain, palpitations and leg swelling.  Gastrointestinal:  Positive for abdominal pain and diarrhea. Negative for blood in stool, constipation, heartburn, nausea and vomiting.  Genitourinary:  Negative for dysuria, frequency, hematuria and urgency.  Musculoskeletal:  Negative for back pain, falls and myalgias.  Skin:  Negative for rash.  Neurological:  Negative for dizziness, sensory change, loss of consciousness, weakness and headaches.  Endo/Heme/Allergies:  Negative for environmental allergies. Does not bruise/bleed easily.  Psychiatric/Behavioral:  Negative for depression and suicidal ideas. The patient is not nervous/anxious and does not have insomnia.       Objective:    Physical Exam Constitutional:      General: She is not in acute distress.    Appearance: She is well-developed.  HENT:     Head: Normocephalic and atraumatic.  Eyes:     Conjunctiva/sclera: Conjunctivae normal.  Neck:     Thyroid: No thyromegaly.  Cardiovascular:     Rate and Rhythm: Normal rate and regular rhythm.     Heart sounds: Normal heart sounds.  No murmur heard. Pulmonary:     Effort: Pulmonary effort is normal. No respiratory distress.     Breath sounds: Normal breath sounds.  Abdominal:     General: Bowel sounds are normal. There is no distension.     Palpations: Abdomen is soft. There is no mass.     Tenderness: There is no abdominal tenderness.  Musculoskeletal:      Cervical back: Neck supple.  Lymphadenopathy:     Cervical: No cervical adenopathy.  Skin:    General: Skin is warm and dry.  Neurological:     Mental Status: She is alert and oriented to person, place, and time.  Psychiatric:        Behavior: Behavior normal.    BP 122/74    Pulse (!) 112    Temp 98.5 F (36.9 C)    Resp 16    Ht $R'5\' 2"'Sr$  (1.575 m)    Wt 154 lb 3.2 oz (69.9 kg)    SpO2 93%    BMI 28.20 kg/m  Wt Readings from Last 3 Encounters:  07/06/21 154 lb 3.2 oz (69.9 kg)  06/04/21 155 lb 9.6 oz (70.6 kg)  03/17/21 155 lb 3.2 oz (70.4 kg)    Diabetic Foot Exam - Simple   No data filed    Lab Results  Component Value Date   WBC 4.9 07/06/2021   HGB 12.7 07/06/2021   HCT 38.1 07/06/2021   PLT 315.0 07/06/2021   GLUCOSE 83 07/06/2021   CHOL 172 07/06/2021   TRIG 110.0 07/06/2021   HDL 55.30 07/06/2021   LDLDIRECT 177.8 09/07/2011   LDLCALC 95 07/06/2021   ALT 70 (H) 07/06/2021   AST 42 (H) 07/06/2021   NA 136 07/06/2021   K 4.0 07/06/2021   CL 99 07/06/2021   CREATININE 0.58 07/06/2021   BUN 11 07/06/2021   CO2 28 07/06/2021   TSH 1.82 07/06/2021   INR 1.0 08/21/2017   HGBA1C 5.5 07/06/2021    Lab Results  Component Value Date   TSH 1.82 07/06/2021   Lab Results  Component Value Date   WBC 4.9 07/06/2021   HGB 12.7 07/06/2021   HCT 38.1 07/06/2021   MCV 92.5 07/06/2021   PLT 315.0 07/06/2021   Lab Results  Component Value Date   NA 136 07/06/2021   K 4.0 07/06/2021   CO2 28 07/06/2021   GLUCOSE 83 07/06/2021   BUN 11 07/06/2021   CREATININE 0.58 07/06/2021   BILITOT 0.3 07/06/2021   ALKPHOS 90 07/06/2021   AST 42 (H) 07/06/2021   ALT 70 (H) 07/06/2021   PROT 6.6 07/06/2021   ALBUMIN 4.3 07/06/2021   CALCIUM 9.2 07/06/2021   ANIONGAP 6 08/24/2017   EGFR 101 04/07/2021   GFR 95.80 07/06/2021   Lab Results  Component Value Date   CHOL 172 07/06/2021   Lab Results  Component Value Date   HDL 55.30 07/06/2021   Lab Results   Component Value Date   LDLCALC 95 07/06/2021   Lab Results  Component Value Date   TRIG 110.0 07/06/2021   Lab Results  Component Value Date   CHOLHDL 3 07/06/2021   Lab Results  Component Value Date   HGBA1C 5.5 07/06/2021       Assessment & Plan:   Problem List Items Addressed This Visit     Hyperglycemia (Chronic)    hgba1c acceptable, minimize simple carbs. Increase exercise as tolerated.      Relevant Orders   Hemoglobin A1c (Completed)  Comprehensive metabolic panel (Completed)   Hyperlipidemia, mixed    Encourage heart healthy diet such as MIND or DASH diet, increase exercise, avoid trans fats, simple carbohydrates and processed foods, consider a krill or fish or flaxseed oil cap daily.       h/o PALPITATIONS - Primary    Have flared this week as she has not felt well this week. Referred back to cardiology for evaluation. EKG NSR today.       Relevant Orders   EKG 12-Lead (Completed)   Magnesium (Completed)   TSH (Completed)   T4, free (Completed)   Ambulatory referral to Cardiology   Coronary artery disease due to lipid rich plaque   Relevant Orders   EKG 12-Lead (Completed)   CBC (Completed)   Ambulatory referral to Cardiology   Severe persistent asthma with intensive monitoring    Doing well on Fasenra      Anxiety and depression    Stable on Venlafaxine      Osteoporosis    Encouraged to get adequate exercise, calcium and vitamin d intake      Statin myopathy    Statin intolerant      Statin intolerance   Relevant Orders   Lipid panel (Completed)   Preventative health care    Patient encouraged to maintain heart healthy diet, regular exercise, adequate sleep. Consider daily probiotics. Take medications as prescribed. Labs ordered and reviewed. MM August 2022 repeat in 1 year. Pap negative August 2021 repeat in 2024. Colonoscopy June 2021 repeat in 10 years.      Iron deficiency anemia    Increase leafy greens, consider increased  lean red meat and using cast iron cookware. Continue to monitor, report any concerns      Relevant Orders   Iron, TIBC and Ferritin Panel (Completed)   Diverticulitis    Is on Augmentin and improving, no fevers since starting med. Was experiencing left sided abdominal pain. Encouraged probiotics and fluids       I have discontinued Tenna Child. Poust's Moderna COVID-19 Vaccine and benzonatate. I am also having her maintain her aspirin, Calcium-Magnesium-Vitamin D, acyclovir ointment, Bioflavonoid Products (ESTER C PO), VITAMIN D PO, MAGNESIUM GLYCINATE PO, nitroGLYCERIN, Fasenra Pen, famotidine, ZINC CITRATE PO, denosumab, LORazepam, venlafaxine XR, ipratropium-albuterol, albuterol, Repatha SureClick, venlafaxine XR, and amoxicillin-clavulanate.  No orders of the defined types were placed in this encounter.    [name], MD

## 2021-07-07 NOTE — Assessment & Plan Note (Signed)
Is on Augmentin and improving, no fevers since starting med. Was experiencing left sided abdominal pain. Encouraged probiotics and fluids

## 2021-07-07 NOTE — Assessment & Plan Note (Signed)
hgba1c acceptable, minimize simple carbs. Increase exercise as tolerated.  

## 2021-07-07 NOTE — Assessment & Plan Note (Signed)
Encourage heart healthy diet such as MIND or DASH diet, increase exercise, avoid trans fats, simple carbohydrates and processed foods, consider a krill or fish or flaxseed oil cap daily.  °

## 2021-07-07 NOTE — Assessment & Plan Note (Signed)
Increase leafy greens, consider increased lean red meat and using cast iron cookware. Continue to monitor, report any concerns 

## 2021-07-07 NOTE — Assessment & Plan Note (Signed)
Stable on Venlafaxine

## 2021-07-07 NOTE — Assessment & Plan Note (Signed)
Have flared this week as she has not felt well this week. Referred back to cardiology for evaluation. EKG NSR today.

## 2021-07-09 ENCOUNTER — Other Ambulatory Visit: Payer: Self-pay

## 2021-07-09 MED ORDER — FERROUS FUMARATE 324 (106 FE) MG PO TABS: 1.0000 | ORAL_TABLET | Freq: Every day | ORAL | 2 refills | Status: DC

## 2021-07-15 ENCOUNTER — Ambulatory Visit: Payer: PPO | Admitting: Nurse Practitioner

## 2021-07-15 ENCOUNTER — Encounter: Payer: Self-pay | Admitting: Nurse Practitioner

## 2021-07-15 VITALS — BP 121/70 | HR 73 | Ht 62.0 in | Wt 155.2 lb

## 2021-07-15 DIAGNOSIS — Z8719 Personal history of other diseases of the digestive system: Secondary | ICD-10-CM | POA: Diagnosis not present

## 2021-07-15 NOTE — Patient Instructions (Addendum)
If you are age 65 or younger, your body mass index should be between 19-25. Your Body mass index is 28.39 kg/m. If this is out of the aformentioned range listed, please consider follow up with your Primary Care Provider.   The [facility] providers would like to encourage you to use [facility] to communicate with providers for non-urgent requests or questions.  Due to long hold times on the telephone, sending your provider a message by [facility] may be faster and more efficient way to get a response. Please allow 48 business hours for a response.  Please remember that this is for non-urgent requests/questions.  Please contact our office if you have recurrent left lower abdominal pain.  It was great seeing you today! Thank you for entrusting me with your care and choosing [facility].  pernicious anemia

## 2021-07-15 NOTE — Progress Notes (Signed)
Addendum: Reviewed and agree with assessment and management plan. Agree with those iron studies I do not think oral iron is necessary Would stop it and recommend CBC, TIBC plus ferritin in 3 months off of oral iron Cipriana Biller, Lajuan Lines, MD

## 2021-07-15 NOTE — Progress Notes (Signed)
ASSESSMENT AND PLAN    Recent LLQ pain, treated with Augmentin after phone discuss ( weekend call). Here for follow up. Was only able to complete 5 days of antibiotics due to nausea. Carafate helped. LLQ pain resolved.  --Reviewed CTAP scan done Jan 2022 >> probable diverticulitis / wall thickening from diverticular disease / or SCAD -Up to date on colonoscopy done June 2021.   # New mild elevation in liver enzymes, 2x ULN or less.  --PCP discussed with patient and apparenlty plan is to repeat liver tests in next few month  # History of iron deficiency following GI bleed in January 2022. Iron stores improved with iron replacement.  Hgb a few days ago was stable at 12.7. Ferritin 277, TIBC 261, 16 % iron sat. Serum iron low at 41. PCP started her on oral iron, patient says that she doesn't tolerate oral iron.  --Asked patient to discuss with PCP but maybe iron stores can just be monitored for now off iron since she doesn't tolerate oral iron and ferritin is 277. I did caution her about frequent NSAID use. Asked that she use Tylenol over Motrin and to watch for black stools.   # GERD / large hiatal hernia. No plans for repair right now.   HISTORY OF PRESENT ILLNESS    Chief Complaint : follow up on diverticulitis.   Victoria [name] is a 66 y.o. female with a past medical history of GERD, large hiatal hernia, Schatzki's ring with dilation in 2016, diverticulosis with history of diverticulitis, pulmonary aspergillosis, asthma, bronchiectasis, HTN, HLD, anxiety, depression, osteoporosis.  Additional medical history as listed in [place]   Patient is known to Dr.  Hilarie Fredrickson  07/03/21 Patient called answering service. Having LLQ pain reminiscent of diverticulitis. Temp 100.9. Started on Augmentin, asked to come in for follow up.  Had annual exam with PCP on 07/06/21. WBC 4.9. Hgb 12.7. AST 42 / ALT 70 ( liver enzymes previously normal).   INTERVAL HISTORY:  Abdominal pain has resolved. Completed 5  days of Augmentin. Couldn't complete full days due to nausea. Carafate helped with nausea. She tells me that her PCP has started her on iron which she is concerned about because it "upsets" her stomach.  Ferritin is 277. TIBC 261 with 16% iron sat. Serum iron 41.    LABORATORY DATA  Hepatic Function Latest Ref Rng & Units 07/06/2021 03/04/2021 09/29/2020  Total Protein 6.0 - 8.3 g/dL 6.6 6.7 7.1  Albumin 3.5 - 5.2 g/dL 4.3 4.4 4.2  AST 0 - 37 U/L 42(H) 21 21  ALT 0 - 35 U/L 70(H) 26 22  Alk Phosphatase 39 - 117 U/L 90 65 73  Total Bilirubin 0.2 - 1.2 mg/dL 0.3 0.3 0.3  Bilirubin, Direct 0.0 - 0.3 mg/dL - - -    CBC Latest Ref Rng & Units 07/06/2021 03/04/2021 02/09/2021  WBC 4.0 - 10.5 K/uL 4.9 5.8 7.0  Hemoglobin 12.0 - 15.0 g/dL 12.7 13.5 13.0  Hematocrit 36.0 - 46.0 % 38.1 40.8 38.8  Platelets 150.0 - 400.0 K/uL 315.0 283.0 262    Lab Results  Component Value Date   LIPASE 34.0 06/30/2020     PREVIOUS IMAGING   07/17/20 CTAP w/ contrast IMPRESSION: 1. Diffuse colonic diverticulosis with short segment wall thickening involving the sigmoid colon, likely accentuated by under distension. However, given the presence of associated presence of diffuse colonic diverticulosis and patient's symptoms, mild colitis/diverticulitis is possible. Additionally, recommend further evaluation with colonoscopy upon completion of  therapy if not recently performed. 2. Large hiatal hernia. 3. Aortic atherosclerosis   PREVIOUS ENDOSCOPIC EVALUATIONS        Past Medical History:  Diagnosis Date   Allergic bronchopulmonary aspergillosis ([place]) 2008   sees Dr Edmund Hilda pulmonology   Anemia    iron deficiency, resolved   Anxiety    Asthma    CAD (coronary artery disease)    a. LHC 6/16:  oOM1 60, pRCA 25 >> med Rx b. cath 3/19 2nd OM with 95% stenosis s/p synergy DES & anomalous RCA   CAP (community acquired pneumonia) 2016; 06/07/2016   Chronic bronchitis (HCC)    Chronic lower  back pain    Complication of anesthesia    "think I have a hard time waking up from it"   COPD (chronic obstructive pulmonary disease) ([place])    Depression    mild   Diverticulitis    Diverticulosis    GERD (gastroesophageal reflux disease)    H/O hiatal hernia    Headache    "weekly" (08/23/2017)   History of echocardiogram    Echo 6/16:  Mod LVH, EF 60-65%, no RWMA, Gr 1 DD, trivial MR, normal LA size.   Hyperglycemia 11/20/2015   Hyperlipidemia, mixed 09/11/2007   Qualifier: Diagnosis of  By: Jerold Coombe   Did not tolerate Lipitor, zocor, Lovastatin, Pravastatin, Livalo, Crestor even low dose    IBS (irritable bowel syndrome)    Maxillary sinusitis    Normal cardiac stress test 11/2011   No evidence of ischemia or infarct.   Calculated ejection fraction 72%.   Obesity    OSA (obstructive sleep apnea) 02/2012   has stopped using  cpap   Osteoarthritis    Osteoporosis    Pneumonia 11/2011   "before 2013 I hadn't had pneumonia since I was a child" (04/13/2012)   Pulmonary nodules    S/P angioplasty with stent 08/23/17 ostial 2nd OM with DES synnergy 08/24/2017   Schatzki's ring     Past Surgical History:  Procedure Laterality Date   APPENDECTOMY  1989   CARDIAC CATHETERIZATION N/A 11/25/2014   Procedure: Right/Left Heart Cath and Coronary Angiography;  Surgeon: Belva Crome, MD;  Location: [facility];  Service: Cardiovascular;  Laterality: N/A;   [place] WITH STENT PLACEMENT  08/23/2017   CORONARY STENT INTERVENTION N/A 08/23/2017   Procedure: CORONARY STENT INTERVENTION;  Surgeon: Burnell Blanks, MD;  Location: [facility];  Service: Cardiovascular;  Laterality: N/A;   HERNIA REPAIR  04/13/2012   VHR laparoscopic   LEFT HEART CATH AND CORONARY ANGIOGRAPHY N/A 08/23/2017   Procedure: LEFT HEART CATH AND CORONARY ANGIOGRAPHY;  Surgeon: Burnell Blanks, MD;  Location: [facility];  Service: Cardiovascular;   Laterality: N/A;   VENTRAL HERNIA REPAIR  04/13/2012   Procedure: LAPAROSCOPIC VENTRAL HERNIA;  Surgeon: Adin Hector, MD;  Location: [facility];  Service: General;  Laterality: N/A;  laparoscopic repair of incarcerated hernia      Current Medications, Allergies, Family History and Social History were reviewed in [employer] record.     Current Outpatient Medications  Medication Sig Dispense Refill   acyclovir ointment (ZOVIRAX) 5 % Apply 1 application topically every 3 (three) hours. 15 g 2   albuterol (VENTOLIN HFA) 108 (90 Base) MCG/ACT inhaler Inhale 1-2 puffs into the lungs every 6 (six) hours as needed. 8 g 2   aspirin 81 MG tablet Take  81 mg by mouth daily.     Benralizumab (FASENRA PEN) 30 MG/ML SOAJ Inject 30 mg into the skin every 8 (eight) weeks. 1 mL 5   Bioflavonoid Products (ESTER C PO) Take 500 mg by mouth daily.     Calcium-Magnesium-Vitamin D 300-150-400 MG-MG-UNIT TABS Take 1 tablet by mouth daily.     denosumab (PROLIA) 60 MG/ML SOSY injection Inject 60 mg into the skin every 6 (six) months.     Evolocumab (REPATHA SURECLICK) 161 MG/ML SOAJ INJECT 1 PEN INTO THE SKIN EVERY 14 DAYS 6 mL 3   famotidine (PEPCID) 20 MG tablet TAKE 1 TABLET (20 MG TOTAL) BY MOUTH AT BEDTIME. 30 tablet 3   Ferrous Fumarate (HEMOCYTE) 324 (106 Fe) MG TABS tablet Take 1 tablet (106 mg of iron total) by mouth daily. 30 tablet 2   ipratropium-albuterol (DUONEB) 0.5-2.5 (3) MG/3ML SOLN ipratropium 0.5 mg-albuterol 3 mg (2.5 mg base)/3 mL nebulization soln  USE 1 VIAL BY NEBULIZATION EVERY 6 (SIX) HOURS AS NEEDED.     LORazepam (ATIVAN) 1 MG tablet TAKE 1/2 TO 1 TABLET BY MOUTH 2 TIMES A DAY AS NEEDED FOR ANXIETY OR SLEEP 30 tablet 2   MAGNESIUM GLYCINATE PO Take 400 mg by mouth daily.     nitroGLYCERIN (NITROSTAT) 0.4 MG SL tablet PLACE 1 TABLET UNDER THE TONGUE EVERY 5 MINUTES AS NEEDED FOR CHEST PAIN. 25 tablet 4   venlafaxine XR (EFFEXOR-XR) 150 MG 24 hr capsule Take 1  capsule (150 mg total) by mouth daily with breakfast. Take with 37.5 mg cap daily 90 capsule 0   venlafaxine XR (EFFEXOR-XR) 37.5 MG 24 hr capsule TAKE 1 CAPSULE (37.5 MG TOTAL) BY MOUTH DAILY WITH BREAKFAST. 30 capsule 1   VITAMIN D PO Take 5,000 Units by mouth daily.     ZINC CITRATE PO Take 30 mg by mouth daily.     No current facility-administered medications for this visit.    Review of Systems: No chest pain. No shortness of breath. No urinary complaints.   PHYSICAL EXAM :    Wt Readings from Last 3 Encounters:  07/15/21 155 lb 3.2 oz (70.4 kg)  07/06/21 154 lb 3.2 oz (69.9 kg)  06/04/21 155 lb 9.6 oz (70.6 kg)    BP 121/70    Pulse 73    Ht 5' 2"  (1.575 m)    Wt 155 lb 3.2 oz (70.4 kg)    SpO2 98%    BMI 28.39 kg/m  Constitutional:  Generally well appearing female in no acute distress. Psychiatric: Pleasant. Normal mood and affect. Behavior is normal. EENT: Pupils normal.  Conjunctivae are normal. No scleral icterus. Neck supple.  Cardiovascular: Normal rate, regular rhythm. No edema Pulmonary/chest: Effort normal and breath sounds normal. No wheezing, rales or rhonchi. Abdominal: Soft, nondistended, nontender. Bowel sounds active throughout. There are no masses palpable. No hepatomegaly. Neurological: Alert and oriented to person place and time. Skin: Skin is warm and dry. No rashes noted.  Tye Savoy, NP  07/15/2021, 11:00 AM  Cc:  Mosie Lukes, MD

## 2021-07-20 DIAGNOSIS — M542 Cervicalgia: Secondary | ICD-10-CM | POA: Diagnosis not present

## 2021-07-20 DIAGNOSIS — M545 Low back pain, unspecified: Secondary | ICD-10-CM | POA: Diagnosis not present

## 2021-07-23 ENCOUNTER — Telehealth: Payer: Self-pay | Admitting: Family Medicine

## 2021-07-23 NOTE — Telephone Encounter (Signed)
Patient states she has been getting physical therapy to work on her back, but now her left hip and thigh are hurting her and it's starting to become debilitating. She would like a new referral to be sent for physical therapy so they can help her with that issue. Pease advise.

## 2021-07-26 ENCOUNTER — Other Ambulatory Visit: Payer: Self-pay | Admitting: Adult Health

## 2021-07-26 ENCOUNTER — Ambulatory Visit (INDEPENDENT_AMBULATORY_CARE_PROVIDER_SITE_OTHER): Payer: PPO | Admitting: Medical

## 2021-07-26 ENCOUNTER — Encounter (HOSPITAL_BASED_OUTPATIENT_CLINIC_OR_DEPARTMENT_OTHER): Payer: Self-pay

## 2021-07-26 ENCOUNTER — Other Ambulatory Visit: Payer: Self-pay | Admitting: Pharmacist

## 2021-07-26 ENCOUNTER — Ambulatory Visit (HOSPITAL_BASED_OUTPATIENT_CLINIC_OR_DEPARTMENT_OTHER)
Admission: RE | Admit: 2021-07-26 | Discharge: 2021-07-26 | Disposition: A | Discharge status: Home / Self Care | Payer: PPO | Source: Ambulatory Visit | Attending: Medical | Admitting: Medical

## 2021-07-26 ENCOUNTER — Encounter: Payer: Self-pay | Admitting: Internal Medicine

## 2021-07-26 ENCOUNTER — Other Ambulatory Visit: Payer: Self-pay

## 2021-07-26 VITALS — BP 120/68 | HR 76 | Temp 98.4°F | Resp 16 | Ht 62.0 in | Wt 155.6 lb

## 2021-07-26 DIAGNOSIS — M2550 Pain in unspecified joint: Secondary | ICD-10-CM

## 2021-07-26 DIAGNOSIS — M25552 Pain in left hip: Secondary | ICD-10-CM | POA: Diagnosis not present

## 2021-07-26 DIAGNOSIS — J4550 Severe persistent asthma, uncomplicated: Secondary | ICD-10-CM

## 2021-07-26 DIAGNOSIS — M25562 Pain in left knee: Secondary | ICD-10-CM | POA: Diagnosis not present

## 2021-07-26 MED ORDER — FASENRA PEN 30 MG/ML ~~LOC~~ SOAJ: 30.0000 mg | SUBCUTANEOUS | 2 refills | Status: DC

## 2021-07-26 NOTE — Progress Notes (Signed)
Subjective:    Patient ID: Victoria [name], female    DOB: 1955/09/21, 66 y.o.   MRN: [id number]  HPI  Pt in for some left hip pain. Pt states she has history of scoliosis and back pain   Pt states around time that she was doing some PT exercises for back she notes hip started to hurt.   She explains around christmas she was doing back exercises and started to feel some mild left hip pain. Pain has gradually worsened. Over the weekend hip started to hurt worse and today notes has left knee pain.  Pt called PT for appt and they noted she needed referral.   Pt tried some advil over the counter today and helped little.   Pain level is about 5/10.    Review of Systems  Constitutional:  Negative for chills, fatigue and fever.  Respiratory:  Negative for cough, chest tightness, shortness of breath and wheezing.   Cardiovascular:  Negative for chest pain and palpitations.  Gastrointestinal:  Negative for abdominal pain, anal bleeding, constipation, nausea and vomiting.  Musculoskeletal:  Negative for back pain, myalgias and neck stiffness.       Hip pain left side and left knee pain.  Skin:  Negative for rash.  Neurological:  Negative for facial asymmetry, speech difficulty, weakness and headaches.  Hematological:  Does not bruise/bleed easily.  Psychiatric/Behavioral:  Negative for behavioral problems. The patient is not nervous/anxious and is not hyperactive.    Past Medical History:  Diagnosis Date   Allergic bronchopulmonary aspergillosis ([place]) 2008   sees Dr Edmund Hilda pulmonology   Anemia    iron deficiency, resolved   Anxiety    Asthma    CAD (coronary artery disease)    a. LHC 6/16:  oOM1 60, pRCA 25 >> med Rx b. cath 3/19 2nd OM with 95% stenosis s/p synergy DES & anomalous RCA   CAP (community acquired pneumonia) 2016; 06/07/2016   Chronic bronchitis (HCC)    Chronic lower back pain    Complication of anesthesia    "think I have a hard time waking up from  it"   COPD (chronic obstructive pulmonary disease) ([place])    Depression    mild   Diverticulitis    Diverticulosis    GERD (gastroesophageal reflux disease)    H/O hiatal hernia    Headache    "weekly" (08/23/2017)   History of echocardiogram    Echo 6/16:  Mod LVH, EF 60-65%, no RWMA, Gr 1 DD, trivial MR, normal LA size.   Hyperglycemia 11/20/2015   Hyperlipidemia, mixed 09/11/2007   Qualifier: Diagnosis of  By: Jerold Coombe   Did not tolerate Lipitor, zocor, Lovastatin, Pravastatin, Livalo, Crestor even low dose    IBS (irritable bowel syndrome)    Maxillary sinusitis    Normal cardiac stress test 11/2011   No evidence of ischemia or infarct.   Calculated ejection fraction 72%.   Obesity    OSA (obstructive sleep apnea) 02/2012   has stopped using  cpap   Osteoarthritis    Osteoporosis    Pneumonia 11/2011   "before 2013 I hadn't had pneumonia since I was a child" (04/13/2012)   Pulmonary nodules    S/P angioplasty with stent 08/23/17 ostial 2nd OM with DES synnergy 08/24/2017   Schatzki's ring      Social History   Socioeconomic History   Marital status: Married    Spouse name: Not on file   Number of children: 1  Years of education: 60   Highest education level: Associate degree: academic program  Occupational History   Occupation: Disabled   Tobacco Use   Smoking status: Never   Smokeless tobacco: Never  Vaping Use   Vaping Use: Never used  Substance and Sexual Activity   Alcohol use: Yes    Comment: social use   Drug use: No   Sexual activity: Yes    Comment: gluten free, lives with husband and son with CP quadriplegia  Other Topics Concern   Not on file  Social History Narrative   Cares for son with cerebral palsy.    Lives at home with her husband and son.   Right-handed.   2 cups caffeine per day.   One story home   Social Determinants of Health   Financial Resource Strain: Not on file  Food Insecurity: Not on file  Transportation Needs: Not on file   Physical Activity: Not on file  Stress: Not on file  Social Connections: Not on file  Intimate Partner Violence: Not on file    Past Surgical History:  Procedure Laterality Date   [place] N/A 11/25/2014   Procedure: Right/Left Heart Cath and Coronary Angiography;  Surgeon: Belva Crome, MD;  Location: [facility];  Service: Cardiovascular;  Laterality: N/A;   [place] WITH STENT PLACEMENT  08/23/2017   CORONARY STENT INTERVENTION N/A 08/23/2017   Procedure: CORONARY STENT INTERVENTION;  Surgeon: Burnell Blanks, MD;  Location: [facility];  Service: Cardiovascular;  Laterality: N/A;   HERNIA REPAIR  04/13/2012   VHR laparoscopic   LEFT HEART CATH AND CORONARY ANGIOGRAPHY N/A 08/23/2017   Procedure: LEFT HEART CATH AND CORONARY ANGIOGRAPHY;  Surgeon: Burnell Blanks, MD;  Location: [facility];  Service: Cardiovascular;  Laterality: N/A;   VENTRAL HERNIA REPAIR  04/13/2012   Procedure: LAPAROSCOPIC VENTRAL HERNIA;  Surgeon: Adin Hector, MD;  Location: [facility];  Service: General;  Laterality: N/A;  laparoscopic repair of incarcerated hernia    Family History  Problem Relation Age of Onset   Breast cancer Mother    Hypertension Mother    Diabetes Mother    Cancer Mother        recurrent, metastatic breast cancer.    Diverticulosis Father    Prostate cancer Father    Breast cancer Sister    Cancer Sister        breast cancer, invasive ductal carcinoma in 2022,DCIS at 16 with 4 weeks of radiation, 5 years of Tamoxifen    Pulmonary embolism Brother        recurrent   Heart attack Maternal Grandfather    Cancer Niece 3   Osteoporosis Niece    Stroke Neg Hx    Colon cancer Neg Hx    Esophageal cancer Neg Hx    Stomach cancer Neg Hx    Rectal cancer Neg Hx     Allergies  Allergen Reactions   Beclomethasone Dipropionate Hives and Other (See Comments)     weight gain    Flexeril [Cyclobenzaprine] Anxiety   Mometasone Furo-Formoterol Fum Hives and Other (See Comments)    weight gain   Sulfonamide Derivatives Hives and Rash   Statins     Myalgias, RLS    Current Outpatient Medications on File Prior to Visit  Medication Sig Dispense Refill   acyclovir ointment (ZOVIRAX) 5 % Apply 1 application topically every 3 (three)  hours. 15 g 2   albuterol (VENTOLIN HFA) 108 (90 Base) MCG/ACT inhaler Inhale 1-2 puffs into the lungs every 6 (six) hours as needed. 8 g 2   aspirin 81 MG tablet Take 81 mg by mouth daily.     Bioflavonoid Products (ESTER C PO) Take 500 mg by mouth daily.     Calcium-Magnesium-Vitamin D 300-150-400 MG-MG-UNIT TABS Take 1 tablet by mouth daily.     denosumab (PROLIA) 60 MG/ML SOSY injection Inject 60 mg into the skin every 6 (six) months.     Evolocumab (REPATHA SURECLICK) 387 MG/ML SOAJ INJECT 1 PEN INTO THE SKIN EVERY 14 DAYS 6 mL 3   famotidine (PEPCID) 20 MG tablet TAKE 1 TABLET (20 MG TOTAL) BY MOUTH AT BEDTIME. 30 tablet 3   Ferrous Fumarate (HEMOCYTE) 324 (106 Fe) MG TABS tablet Take 1 tablet (106 mg of iron total) by mouth daily. 30 tablet 2   LORazepam (ATIVAN) 1 MG tablet TAKE 1/2 TO 1 TABLET BY MOUTH 2 TIMES A DAY AS NEEDED FOR ANXIETY OR SLEEP 30 tablet 2   MAGNESIUM GLYCINATE PO Take 400 mg by mouth daily.     nitroGLYCERIN (NITROSTAT) 0.4 MG SL tablet PLACE 1 TABLET UNDER THE TONGUE EVERY 5 MINUTES AS NEEDED FOR CHEST PAIN. 25 tablet 4   venlafaxine XR (EFFEXOR-XR) 150 MG 24 hr capsule Take 1 capsule (150 mg total) by mouth daily with breakfast. Take with 37.5 mg cap daily 90 capsule 0   venlafaxine XR (EFFEXOR-XR) 37.5 MG 24 hr capsule TAKE 1 CAPSULE (37.5 MG TOTAL) BY MOUTH DAILY WITH BREAKFAST. 30 capsule 1   VITAMIN D PO Take 5,000 Units by mouth daily.     ZINC CITRATE PO Take 30 mg by mouth daily.     No current facility-administered medications on file prior to visit.    BP 120/68 (BP Location: Left Arm, Patient  Position: Sitting, Cuff Size: Normal)    Pulse 76    Temp 98.4 F (36.9 C) (Oral)    Resp 16    Ht 5\' 2"  (1.575 m)    Wt 155 lb 9.6 oz (70.6 kg)    SpO2 100%    BMI 28.46 kg/m        Objective:   Physical Exam   General- No acute distress. Pleasant patient. Neck- Full range of motion, no jvd Lungs- Clear, even and unlabored. Heart- regular rate and rhythm. Neurologic- CNII- XII grossly intact.  Left hip- pain on palpation of hip. On rom not reporting pain.  Rt hip- no pain on palpation or rom. Left knee- mild crepitus on flexion and extension. No swelling.    Assessment & Plan:   Patient Instructions  Left hip and left knee pain. Will get xray of both area and then decide on potential med treatment. Presently considering meloxicam vs low dose prednisone taper.  Might refer back to PT or consider sports Med.  Follow up date to be determined after xray review.       Mackie Pai, PA-C

## 2021-07-26 NOTE — Telephone Encounter (Signed)
Mychart message sent by pt: [facility] (supporting Brand Males, MD) 3 hours ago (10:39 AM)   I went through process to request my next [place] pen, and I got the following email message from [facility] that looks they are needing a refill on my [employer].  Thank you.   Thank you for your interest in the [facility] Prescription Savings Program. We need additional information to process your application. We need the following information to continue processing your request:   Prescription - Refill Request Prescription - Clarification - refills,quantity   Next Steps:Upload the required information to [email or web address] or fax to [phone].    If you have any questions, please contact us at 1-800-[facility] [phone]), Monday through Friday 9AM-6PM ET, excluding holidays. Please do not send patient specific health information via email.      Routing to pharmacy team for advice.

## 2021-07-26 NOTE — Telephone Encounter (Signed)
Pt seen by Percell Miller

## 2021-07-26 NOTE — Addendum Note (Signed)
Addended by: Anabel Halon on: 07/26/2021 07:36 PM   Modules accepted: Orders

## 2021-07-26 NOTE — Patient Instructions (Addendum)
Left hip and left knee pain. Will get xray of both area and then decide on potential med treatment. Presently considering meloxicam vs low dose prednisone taper.  Might refer back to PT or consider sports Med.  Follow up date to be determined after xray review.

## 2021-07-26 NOTE — Telephone Encounter (Signed)
Refill sent for [facility] to  [facility]  Dose: 30 mg SQ every 8 weeks  Last OV: 06/04/21 Provider: Dr. Chase Caller  Next OV: 09/06/21  Knox Saliva, PharmD, MPH, BCPS Clinical Pharmacist (Rheumatology and Pulmonology)

## 2021-07-27 MED ORDER — MELOXICAM 7.5 MG PO TABS: ORAL_TABLET | ORAL | 0 refills | Status: DC

## 2021-07-27 NOTE — Addendum Note (Signed)
Addended by: Anabel Halon on: 07/27/2021 06:25 AM   Modules accepted: Orders

## 2021-07-28 ENCOUNTER — Other Ambulatory Visit: Payer: Self-pay | Admitting: Family Medicine

## 2021-07-28 ENCOUNTER — Encounter: Payer: Self-pay | Admitting: Family Medicine

## 2021-07-28 ENCOUNTER — Encounter: Payer: Self-pay | Admitting: Medical

## 2021-07-28 DIAGNOSIS — M898X5 Other specified disorders of bone, thigh: Secondary | ICD-10-CM

## 2021-07-28 NOTE — Addendum Note (Signed)
Addended by: Anabel Halon on: 07/28/2021 12:22 PM   Modules accepted: Orders

## 2021-07-29 ENCOUNTER — Encounter: Payer: Self-pay | Admitting: Family Medicine

## 2021-07-29 ENCOUNTER — Ambulatory Visit (HOSPITAL_BASED_OUTPATIENT_CLINIC_OR_DEPARTMENT_OTHER)
Admission: RE | Admit: 2021-07-29 | Discharge: 2021-07-29 | Disposition: A | Discharge status: Home / Self Care | Payer: PPO | Source: Ambulatory Visit | Attending: Family Medicine | Admitting: Family Medicine

## 2021-07-29 ENCOUNTER — Ambulatory Visit (INDEPENDENT_AMBULATORY_CARE_PROVIDER_SITE_OTHER): Payer: PPO | Admitting: Family Medicine

## 2021-07-29 ENCOUNTER — Ambulatory Visit: Payer: Self-pay

## 2021-07-29 ENCOUNTER — Other Ambulatory Visit: Payer: Self-pay

## 2021-07-29 VITALS — BP 120/80 | Ht 62.0 in | Wt 155.0 lb

## 2021-07-29 DIAGNOSIS — M898X5 Other specified disorders of bone, thigh: Secondary | ICD-10-CM | POA: Insufficient documentation

## 2021-07-29 DIAGNOSIS — M25552 Pain in left hip: Secondary | ICD-10-CM

## 2021-07-29 DIAGNOSIS — M79652 Pain in left thigh: Secondary | ICD-10-CM | POA: Diagnosis not present

## 2021-07-29 MED ORDER — METHYLPREDNISOLONE ACETATE 40 MG/ML IJ SUSP
40.0000 mg | Freq: Once | INTRAMUSCULAR | Status: AC
  Administered 2021-07-29: 40 mg via INTRA_ARTICULAR

## 2021-07-29 NOTE — Patient Instructions (Signed)
Good to see you Please try ice  Please try the exercises  Please send me a message in MyChart with any questions or updates.  Please see me back in 4 weeks.   --Dr. Jezebelle Ledwell  

## 2021-07-29 NOTE — Assessment & Plan Note (Signed)
Acutely occurring.  More related to bursitis over the area.  Less likely for radicular pain. -Counseled on home exercise therapy and supportive care. -Injection today. -Could consider SI joint injection or hip joint injection.

## 2021-07-29 NOTE — Progress Notes (Signed)
Victoria [name] - 66 y.o. female MRN [id number]  Date of birth: 1956-01-14  SUBJECTIVE:  Including CC & ROS.  No chief complaint on file.   Victoria [name] is a 66 y.o. female that is presenting with acute left hip pain.  She has a significant history of scoliosis.  Pain is occurring over the lateral part one of the hip.  Review of the note from 2/6 shows x-rays were done and medication consider. Independent review of the left hip x-ray from 2/6 shows no acute changes. Independent review of the left knee x-ray from 2/6 shows no acute changes. Independent review of the left femur x-ray from 2/9 shows no acute changes.  Review of Systems See HPI   HISTORY: Past Medical, Surgical, Social, and Family History Reviewed & Updated per EMR.   Pertinent Historical Findings include:  Past Medical History:  Diagnosis Date   Allergic bronchopulmonary aspergillosis ([place]) 2008   sees Dr Edmund Hilda pulmonology   Anemia    iron deficiency, resolved   Anxiety    Asthma    CAD (coronary artery disease)    a. LHC 6/16:  oOM1 60, pRCA 25 >> med Rx b. cath 3/19 2nd OM with 95% stenosis s/p synergy DES & anomalous RCA   CAP (community acquired pneumonia) 2016; 06/07/2016   Chronic bronchitis (HCC)    Chronic lower back pain    Complication of anesthesia    "think I have a hard time waking up from it"   COPD (chronic obstructive pulmonary disease) ([place])    Depression    mild   Diverticulitis    Diverticulosis    GERD (gastroesophageal reflux disease)    H/O hiatal hernia    Headache    "weekly" (08/23/2017)   History of echocardiogram    Echo 6/16:  Mod LVH, EF 60-65%, no RWMA, Gr 1 DD, trivial MR, normal LA size.   Hyperglycemia 11/20/2015   Hyperlipidemia, mixed 09/11/2007   Qualifier: Diagnosis of  By: Jerold Coombe   Did not tolerate Lipitor, zocor, Lovastatin, Pravastatin, Livalo, Crestor even low dose    IBS (irritable bowel syndrome)    Maxillary sinusitis    Normal cardiac  stress test 11/2011   No evidence of ischemia or infarct.   Calculated ejection fraction 72%.   Obesity    OSA (obstructive sleep apnea) 02/2012   has stopped using  cpap   Osteoarthritis    Osteoporosis    Pneumonia 11/2011   "before 2013 I hadn't had pneumonia since I was a child" (04/13/2012)   Pulmonary nodules    S/P angioplasty with stent 08/23/17 ostial 2nd OM with DES synnergy 08/24/2017   Schatzki's ring     Past Surgical History:  Procedure Laterality Date   APPENDECTOMY  1989   CARDIAC CATHETERIZATION N/A 11/25/2014   Procedure: Right/Left Heart Cath and Coronary Angiography;  Surgeon: Belva Crome, MD;  Location: [facility];  Service: Cardiovascular;  Laterality: N/A;   [place] WITH STENT PLACEMENT  08/23/2017   CORONARY STENT INTERVENTION N/A 08/23/2017   Procedure: CORONARY STENT INTERVENTION;  Surgeon: Burnell Blanks, MD;  Location: [facility];  Service: Cardiovascular;  Laterality: N/A;   HERNIA REPAIR  04/13/2012   VHR laparoscopic   LEFT HEART CATH AND CORONARY ANGIOGRAPHY N/A 08/23/2017   Procedure: LEFT HEART CATH AND CORONARY ANGIOGRAPHY;  Surgeon: Burnell Blanks, MD;  Location: [facility];  Service: Cardiovascular;  Laterality: N/A;   VENTRAL HERNIA REPAIR  04/13/2012   Procedure: LAPAROSCOPIC VENTRAL HERNIA;  Surgeon: Adin Hector, MD;  Location: [facility];  Service: General;  Laterality: N/A;  laparoscopic repair of incarcerated hernia     PHYSICAL EXAM:  VS: BP 120/80 (BP Location: Left Arm, Patient Position: Sitting)    Ht 5\' 2"  (1.575 m)    Wt 155 lb (70.3 kg)    BMI 28.35 kg/m  Physical Exam Gen: NAD, alert, cooperative with exam, well-appearing MSK:  Neurovascularly intact     Aspiration/Injection Procedure Note Victoria [name] [birth year]  Procedure: Injection Indications: Left hip pain  Procedure Details Consent: Risks of procedure as well as the alternatives and risks of  each were explained to the (patient/caregiver).  Consent for procedure obtained. Time Out: Verified patient identification, verified procedure, site/side was marked, verified correct patient position, special equipment/implants available, medications/allergies/relevent history reviewed, required imaging and test results available.  Performed.  The area was cleaned with iodine and alcohol swabs.    The left trochanteric bursa was injected using 3 cc of 1% lidocaine on a 22-gauge 3 inch needle.  The syringe was switched and a mixture containing 1 cc's of 40 mg Depo-Medrol and 4 cc's of 0.25% bupivacaine was injected.  Ultrasound was used. Images were obtained in short views showing the injection.     A sterile dressing was applied.  Patient did tolerate procedure well.     ASSESSMENT & PLAN:   Greater trochanteric pain syndrome of left lower extremity Acutely occurring.  More related to bursitis over the area.  Less likely for radicular pain. -Counseled on home exercise therapy and supportive care. -Injection today. -Could consider SI joint injection or hip joint injection.

## 2021-08-05 ENCOUNTER — Telehealth: Payer: Self-pay | Admitting: Family Medicine

## 2021-08-05 DIAGNOSIS — D229 Melanocytic nevi, unspecified: Secondary | ICD-10-CM | POA: Diagnosis not present

## 2021-08-05 DIAGNOSIS — D1800 Hemangioma unspecified site: Secondary | ICD-10-CM | POA: Diagnosis not present

## 2021-08-05 DIAGNOSIS — M545 Low back pain, unspecified: Secondary | ICD-10-CM | POA: Diagnosis not present

## 2021-08-05 DIAGNOSIS — M542 Cervicalgia: Secondary | ICD-10-CM | POA: Diagnosis not present

## 2021-08-05 DIAGNOSIS — M25552 Pain in left hip: Secondary | ICD-10-CM | POA: Diagnosis not present

## 2021-08-05 DIAGNOSIS — L821 Other seborrheic keratosis: Secondary | ICD-10-CM | POA: Diagnosis not present

## 2021-08-05 DIAGNOSIS — M79652 Pain in left thigh: Secondary | ICD-10-CM | POA: Diagnosis not present

## 2021-08-05 DIAGNOSIS — D239 Other benign neoplasm of skin, unspecified: Secondary | ICD-10-CM | POA: Diagnosis not present

## 2021-08-05 DIAGNOSIS — L814 Other melanin hyperpigmentation: Secondary | ICD-10-CM | POA: Diagnosis not present

## 2021-08-05 NOTE — Telephone Encounter (Signed)
Left message for patient to call back and schedule Medicare Annual Wellness Visit (AWV) in office.  ° °If not able to come in office, please offer to do virtually or by telephone.  Left office number and my jabber #[phone]. ° °Due for AWVI ° °Please schedule at anytime with Nurse Health Advisor. °  °

## 2021-08-09 DIAGNOSIS — J449 Chronic obstructive pulmonary disease, unspecified: Secondary | ICD-10-CM | POA: Diagnosis not present

## 2021-08-09 DIAGNOSIS — Z7722 Contact with and (suspected) exposure to environmental tobacco smoke (acute) (chronic): Secondary | ICD-10-CM | POA: Diagnosis not present

## 2021-08-09 DIAGNOSIS — J4550 Severe persistent asthma, uncomplicated: Secondary | ICD-10-CM | POA: Diagnosis not present

## 2021-08-10 DIAGNOSIS — M542 Cervicalgia: Secondary | ICD-10-CM | POA: Diagnosis not present

## 2021-08-10 DIAGNOSIS — M25552 Pain in left hip: Secondary | ICD-10-CM | POA: Diagnosis not present

## 2021-08-10 DIAGNOSIS — M545 Low back pain, unspecified: Secondary | ICD-10-CM | POA: Diagnosis not present

## 2021-08-10 DIAGNOSIS — M79652 Pain in left thigh: Secondary | ICD-10-CM | POA: Diagnosis not present

## 2021-08-11 ENCOUNTER — Encounter: Payer: Self-pay | Admitting: Family Medicine

## 2021-08-12 ENCOUNTER — Ambulatory Visit: Payer: PPO | Admitting: Hematology and Oncology

## 2021-08-12 DIAGNOSIS — M545 Low back pain, unspecified: Secondary | ICD-10-CM | POA: Diagnosis not present

## 2021-08-12 DIAGNOSIS — M79652 Pain in left thigh: Secondary | ICD-10-CM | POA: Diagnosis not present

## 2021-08-12 DIAGNOSIS — M542 Cervicalgia: Secondary | ICD-10-CM | POA: Diagnosis not present

## 2021-08-12 DIAGNOSIS — M25552 Pain in left hip: Secondary | ICD-10-CM | POA: Diagnosis not present

## 2021-08-15 ENCOUNTER — Emergency Department (HOSPITAL_BASED_OUTPATIENT_CLINIC_OR_DEPARTMENT_OTHER)
Admission: EM | Admit: 2021-08-15 | Discharge: 2021-08-15 | Disposition: A | Discharge status: Home / Self Care | Payer: PPO | Attending: Emergency Medicine | Admitting: Emergency Medicine

## 2021-08-15 ENCOUNTER — Encounter (HOSPITAL_BASED_OUTPATIENT_CLINIC_OR_DEPARTMENT_OTHER): Payer: Self-pay

## 2021-08-15 ENCOUNTER — Emergency Department (HOSPITAL_BASED_OUTPATIENT_CLINIC_OR_DEPARTMENT_OTHER): Payer: PPO

## 2021-08-15 ENCOUNTER — Other Ambulatory Visit: Payer: Self-pay

## 2021-08-15 DIAGNOSIS — J449 Chronic obstructive pulmonary disease, unspecified: Secondary | ICD-10-CM | POA: Diagnosis not present

## 2021-08-15 DIAGNOSIS — I2510 Atherosclerotic heart disease of native coronary artery without angina pectoris: Secondary | ICD-10-CM | POA: Diagnosis not present

## 2021-08-15 DIAGNOSIS — Z7982 Long term (current) use of aspirin: Secondary | ICD-10-CM | POA: Diagnosis not present

## 2021-08-15 DIAGNOSIS — M5441 Lumbago with sciatica, right side: Secondary | ICD-10-CM | POA: Diagnosis not present

## 2021-08-15 DIAGNOSIS — G8929 Other chronic pain: Secondary | ICD-10-CM | POA: Diagnosis not present

## 2021-08-15 DIAGNOSIS — R110 Nausea: Secondary | ICD-10-CM | POA: Diagnosis not present

## 2021-08-15 DIAGNOSIS — M545 Low back pain, unspecified: Secondary | ICD-10-CM | POA: Insufficient documentation

## 2021-08-15 DIAGNOSIS — M549 Dorsalgia, unspecified: Secondary | ICD-10-CM | POA: Diagnosis not present

## 2021-08-15 DIAGNOSIS — S32010A Wedge compression fracture of first lumbar vertebra, initial encounter for closed fracture: Secondary | ICD-10-CM | POA: Diagnosis not present

## 2021-08-15 DIAGNOSIS — I10 Essential (primary) hypertension: Secondary | ICD-10-CM | POA: Diagnosis not present

## 2021-08-15 MED ORDER — HYDROCODONE-ACETAMINOPHEN 5-325 MG PO TABS: 1.0000 | ORAL_TABLET | Freq: Four times a day (QID) | ORAL | 0 refills | Status: DC | PRN

## 2021-08-15 MED ORDER — HYDROCODONE-ACETAMINOPHEN 5-325 MG PO TABS
1.0000 | ORAL_TABLET | Freq: Once | ORAL | Status: AC
Start: 2021-08-15 — End: 2021-08-15
  Administered 2021-08-15: 1 via ORAL
  Filled 2021-08-15: qty 1

## 2021-08-15 NOTE — ED Provider Notes (Signed)
[facility] EMERGENCY DEPARTMENT Provider Note   CSN: [id number] Arrival date & time: 08/15/21  1053     History  Chief Complaint  Patient presents with   Back Pain    Victoria [name] is a 66 y.o. female.  Patient is a 66 year old female with a history of significant scoliosis with chronic back pain on a daily basis, decreased bone density on Prolia, COPD, CAD, hyperlipidemia who is presenting with gradually worsening back pain over the last 2 to 3 weeks.  She reports she always has back pain and has been on meloxicam which makes it easier for her to function but since she had a massage on 16 February her pain in the right side of her back has been gradually worsening.  She had an injection in her SI joint on the left side on the 10th and had significant improvement with that but has even been back to see her physical therapist has been trying to do her stretches regularly and reports the pain is just not improving.  The pain is worse when she gets out of bed in the morning and then has now become so bad that even when she is up trying to walk with the walker the pain becomes so severe she is unable to move it and feels nauseated and today even felt like she might pass out.  She reports typically when the pain gets this bad she has to be evaluated.  She is continue to take the meloxicam but is on no muscle relaxers due to intolerance in the past.  The history is provided by the patient.  Back Pain     Home Medications Prior to Admission medications   Medication Sig Start Date End Date Taking? Authorizing Provider  HYDROcodone-acetaminophen (NORCO/VICODIN) 5-325 MG tablet Take 1 tablet by mouth every 6 (six) hours as needed. 08/15/21  Yes Blanchie Dessert, MD  acyclovir ointment (ZOVIRAX) 5 % Apply 1 application topically every 3 (three) hours. 10/09/18   Mosie Lukes, MD  albuterol (VENTOLIN HFA) 108 (90 Base) MCG/ACT inhaler Inhale 1-2 puffs into the lungs every 6 (six) hours  as needed. 03/17/21   Parrett, Fonnie Mu, NP  aspirin 81 MG tablet Take 81 mg by mouth daily.    [provider]  Benralizumab (FASENRA PEN) 30 MG/ML SOAJ Inject 1 mL (30 mg total) into the skin every 8 (eight) weeks. 07/26/21   Brand Males, MD  Bioflavonoid Products (ESTER C PO) Take 500 mg by mouth daily.    [provider]  Calcium-Magnesium-Vitamin D 300-150-400 MG-MG-UNIT TABS Take 1 tablet by mouth daily.    [provider]  denosumab (PROLIA) 60 MG/ML SOSY injection Inject 60 mg into the skin every 6 (six) months.    [provider]  Evolocumab (REPATHA SURECLICK) 010 MG/ML SOAJ INJECT 1 PEN INTO THE SKIN EVERY 14 DAYS 05/10/21   Freada Bergeron, MD  famotidine (PEPCID) 20 MG tablet TAKE 1 TABLET (20 MG TOTAL) BY MOUTH AT BEDTIME. 09/21/20   Pyrtle, Lajuan Lines, MD  Ferrous Fumarate (HEMOCYTE) 324 (106 Fe) MG TABS tablet Take 1 tablet (106 mg of iron total) by mouth daily. 07/09/21   Mosie Lukes, MD  ipratropium-albuterol (DUONEB) 0.5-2.5 (3) MG/3ML SOLN USE 1 VIAL BY NEBULIZATION EVERY 6 (SIX) HOURS AS NEEDED. 07/26/21   Parrett, Fonnie Mu, NP  LORazepam (ATIVAN) 1 MG tablet TAKE 1/2 TO 1 TABLET BY MOUTH 2 TIMES A DAY AS NEEDED FOR ANXIETY OR SLEEP 12/10/20  Mosie Lukes, MD  MAGNESIUM GLYCINATE PO Take 400 mg by mouth daily.    [provider]  meloxicam (MOBIC) 7.5 MG tablet 1-2 tab po q day 07/27/21   Saguier, Percell Miller, PA-C  nitroGLYCERIN (NITROSTAT) 0.4 MG SL tablet PLACE 1 TABLET UNDER THE TONGUE EVERY 5 MINUTES AS NEEDED FOR CHEST PAIN. 05/05/20   Dorothy Spark, MD  venlafaxine XR (EFFEXOR-XR) 150 MG 24 hr capsule TAKE 1 CAPSULE BY MOUTH DAILY WITH BREAKFAST. 07/28/21   Mosie Lukes, MD  venlafaxine XR (EFFEXOR-XR) 37.5 MG 24 hr capsule TAKE 1 CAPSULE (37.5 MG TOTAL) BY MOUTH DAILY WITH BREAKFAST. 05/31/21   Mosie Lukes, MD  VITAMIN D PO Take 5,000 Units by mouth daily.    [provider]  ZINC CITRATE PO Take 30 mg by mouth  daily.    [provider]      Allergies    Beclomethasone dipropionate, Flexeril [cyclobenzaprine], Mometasone furo-formoterol fum, Sulfonamide derivatives, and Statins    Review of Systems   Review of Systems  Musculoskeletal:  Positive for back pain.   Physical Exam Updated Vital Signs BP 138/71 (BP Location: Right Arm)    Pulse 66    Temp 97.8 F (36.6 C) (Oral)    Resp 17    Ht 5\' 2"  (1.575 m)    Wt 70.3 kg    SpO2 98%    BMI 28.35 kg/m  Physical Exam Vitals and nursing note reviewed.  Constitutional:      General: She is not in acute distress.    Appearance: She is well-developed.  HENT:     Head: Normocephalic and atraumatic.  Eyes:     Pupils: Pupils are equal, round, and reactive to light.  Cardiovascular:     Rate and Rhythm: Normal rate and regular rhythm.     Heart sounds: Normal heart sounds. No murmur heard.   No friction rub.  Pulmonary:     Effort: Pulmonary effort is normal.     Breath sounds: Normal breath sounds. No wheezing or rales.  Abdominal:     General: Bowel sounds are normal. There is no distension.     Palpations: Abdomen is soft.     Tenderness: There is no abdominal tenderness. There is no guarding or rebound.  Musculoskeletal:        General: No tenderness. Normal range of motion.     Comments: No edema.  Lumbar spinal curvature.  Significant pain over the right paralumbar region.  Also the pain tracks up into the parathoracic.  No pain in the buttocks  Skin:    General: Skin is warm and dry.     Findings: No rash.  Neurological:     Mental Status: She is alert and oriented to person, place, and time. Mental status is at baseline.     Cranial Nerves: No cranial nerve deficit.     Sensory: No sensory deficit.     Motor: No weakness.  Psychiatric:        Mood and Affect: Mood normal.        Behavior: Behavior normal.    ED Results / Procedures / Treatments   Labs (all labs ordered are listed, but only abnormal results are  displayed) Labs Reviewed - No data to display  EKG None  Radiology DG Lumbar Spine 2-3 Views  Result Date: 08/15/2021 CLINICAL DATA:  Pain.  Back pain worse since massage on 08/05/2021. EXAM: LUMBAR SPINE - 2-3 VIEW COMPARISON:  03/02/2017 FINDINGS: There  is convex LEFT scoliosis, exaggerated lordosis, and associated significant degenerative change. The appearance is similar to prior study. Remote anterior compression of L1, similar to prior. Large uncovertebral osteophytes at L 1 2 and T12-L1. No acute fracture or subluxation. IMPRESSION: Exaggerated lordosis, convex LEFT scoliosis, and stable significant degenerative changes. Stable compression fracture of L1. No evidence for acute abnormality. Electronically Signed   By: Nolon Nations M.D.   On: 08/15/2021 11:56    Procedures Procedures    Medications Ordered in ED Medications  HYDROcodone-acetaminophen (NORCO/VICODIN) 5-325 MG per tablet 1 tablet (1 tablet Oral Given 08/15/21 1147)    ED Course/ Medical Decision Making/ A&P                           Medical Decision Making Amount and/or Complexity of Data Reviewed External Data Reviewed: notes. Radiology: ordered and independent interpretation performed. Decision-making details documented in ED Course.  Risk OTC drugs. Prescription drug management.   Patient presenting with significant history of back issues now with worsening pain over the last 3 weeks.  Her pain is all right-sided most likely related to her severe scoliosis.  She has no radicular symptoms going into her buttocks or down her legs.  Because of the pain it is making her have difficulty walking but not because of a neurologic cause.  She has no infectious symptoms and denies any urinary or bowel issues.  She has been taking meloxicam with some improvement but it is not lasting as long as usual.  She has been on multiple muscle relaxers which she is intolerant to.  Vital signs are normal today.  Low suspicion for  cauda equina, acute urinary or lung pathology.  Patient given a dose of pain medication here and plain films are pending.  She denies any new trauma but does have a history of significant decreased bone density and we will do a plain film to ensure no new spontaneous compression fracture.  12:44 PM I independently interpreted and viewed patient's x-ray which shows significant scoliosis.  Allergy reported exaggerated lordosis, convex left scoliosis and stable significant degenerative changes without evidence of new compression fracture.  Findings were discussed with the patient.  She does have mild improvement with hydrocodone and was given a short prescription.  Encouraged her to follow-up with her doctor tomorrow as planned.  Was able to ambulate here with minimal difficulty.        Final Clinical Impression(s) / ED Diagnoses Final diagnoses:  Chronic right-sided low back pain without sciatica    Rx / DC Orders ED Discharge Orders          Ordered    HYDROcodone-acetaminophen (NORCO/VICODIN) 5-325 MG tablet  Every 6 hours PRN        08/15/21 1238              Blanchie Dessert, MD 08/15/21 1245

## 2021-08-15 NOTE — ED Triage Notes (Signed)
Pt to er room number 8, pt states that she had a massage on February 16th and since then her back pain has been getting worse, pt states that she has been going to pt for scoliosis for the past 5 years, states that she is here today for increased pain, states that she has an appointment for a cortisone injection tomorrow.

## 2021-08-15 NOTE — ED Notes (Signed)
Pt states that her husband is coming to get her, states that she is ready to go home, verbalized understanding d/c and follow up. Advised to return for any concerns or worsening symptoms

## 2021-08-15 NOTE — ED Notes (Signed)
Pt in bed, pt denies numbness in lower extremities.

## 2021-08-15 NOTE — Discharge Instructions (Signed)
Continue taking your meloxicam.  Heat and gentle massage may also help.  Follow-up tomorrow as planned.

## 2021-08-16 ENCOUNTER — Ambulatory Visit: Payer: PPO | Admitting: Family Medicine

## 2021-08-16 ENCOUNTER — Ambulatory Visit: Payer: Self-pay

## 2021-08-16 VITALS — BP 124/70 | Ht 62.0 in | Wt 155.0 lb

## 2021-08-16 DIAGNOSIS — M533 Sacrococcygeal disorders, not elsewhere classified: Secondary | ICD-10-CM

## 2021-08-16 MED ORDER — TRIAMCINOLONE ACETONIDE 40 MG/ML IJ SUSP
40.0000 mg | Freq: Once | INTRAMUSCULAR | Status: AC
Start: 2021-08-16 — End: 2021-08-16
  Administered 2021-08-16: 40 mg via INTRA_ARTICULAR

## 2021-08-16 NOTE — Assessment & Plan Note (Signed)
Acutely occurring.  Severe pain exacerbated. -Counseled on home exercise therapy and supportive care. -Injection today. -Could consider further imaging for consideration of facet injections.

## 2021-08-16 NOTE — Progress Notes (Signed)
Victoria [name] - 66 y.o. female MRN [id number]  Date of birth: [birth year]  SUBJECTIVE:  Including CC & ROS.  No chief complaint on file.   Victoria [name] is a 66 y.o. female that is presenting with acute right low back pain.  The pain is occurring over the SI joint.  It was severe enough that it sent her to the emergency department.  No radicular pain.  Independent review of the lumbar spine x-ray from 2/26 shows severe scoliosis and degenerative changes of the facet joints.   Review of Systems See HPI   HISTORY: Past Medical, Surgical, Social, and Family History Reviewed & Updated per EMR.   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 LHC 6/16:  oOM1 60, pRCA 25 >> med Rx b. cath 3/19 2nd OM with 95% stenosis s/p synergy DES & anomalous RCA   CAP (community acquired pneumonia) 2016; 06/07/2016   Chronic bronchitis (HCC)    Chronic lower back pain    Complication of anesthesia    "think I have a hard time waking up from it"   COPD (chronic obstructive pulmonary disease) ([place])    Depression    mild   Diverticulitis    Diverticulosis    GERD (gastroesophageal reflux disease)    H/O hiatal hernia    Headache    "weekly" (08/23/2017)   History of echocardiogram    Echo 6/16:  Mod LVH, EF 60-65%, no RWMA, Gr 1 DD, trivial MR, normal LA size.   Hyperglycemia 11/20/2015   Hyperlipidemia, mixed 09/11/2007   Qualifier: Diagnosis of  By: Jerold Coombe   Did not tolerate Lipitor, zocor, Lovastatin, Pravastatin, Livalo, Crestor even low dose    IBS (irritable bowel syndrome)    Maxillary sinusitis    Normal cardiac stress test 11/2011   No evidence of ischemia or infarct.   Calculated ejection fraction 72%.   Obesity    OSA (obstructive sleep apnea) 02/2012    has stopped using  cpap   Osteoarthritis    Osteoporosis    Pneumonia 11/2011   "before 2013 I hadn't had pneumonia since I was a child" (04/13/2012)   Pulmonary nodules    S/P angioplasty with stent 08/23/17 ostial 2nd OM with DES synnergy 08/24/2017   Schatzki's ring     Past Surgical History:  Procedure Laterality Date   APPENDECTOMY  1989   CARDIAC CATHETERIZATION N/A 11/25/2014   Procedure: Right/Left Heart Cath and Coronary Angiography;  Surgeon: Belva Crome, MD;  Location: [facility];  Service: Cardiovascular;  Laterality: N/A;   [place] WITH STENT PLACEMENT  08/23/2017   CORONARY STENT INTERVENTION N/A 08/23/2017   Procedure: CORONARY STENT INTERVENTION;  Surgeon: Burnell Blanks, MD;  Location: [facility];  Service: Cardiovascular;  Laterality: N/A;   HERNIA REPAIR  04/13/2012   VHR laparoscopic   LEFT HEART CATH AND CORONARY ANGIOGRAPHY N/A 08/23/2017   Procedure: LEFT HEART CATH AND CORONARY ANGIOGRAPHY;  Surgeon: Burnell Blanks, MD;  Location: [facility];  Service: Cardiovascular;  Laterality: N/A;   VENTRAL HERNIA REPAIR  04/13/2012   Procedure: LAPAROSCOPIC VENTRAL HERNIA;  Surgeon: Adin Hector, MD;  Location: [facility]  OR;  Service: General;  Laterality: N/A;  laparoscopic repair of incarcerated hernia     PHYSICAL EXAM:  VS: BP 124/70    Ht 5\' 2"  (1.575 m)    Wt 155 lb (70.3 kg)    BMI 28.35 kg/m  Physical Exam Gen: NAD, alert, cooperative with exam, well-appearing MSK:  Neurovascularly intact     Aspiration/Injection Procedure Note Victoria [name] May 02, 1956  Procedure: Injection Indications: Right SI joint  Procedure Details Consent: Risks of procedure as well as the alternatives and risks of each were explained to the (patient/caregiver).  Consent for procedure obtained. Time Out: Verified patient identification, verified procedure, site/side was marked, verified correct patient  position, special equipment/implants available, medications/allergies/relevent history reviewed, required imaging and test results available.  Performed.  The area was cleaned with iodine and alcohol swabs.    The right SI joint was injected using 3 cc of 1% lidocaine on a 22-gauge 3-1/2 inch needle.  The syringe was switched and a mixture containing 1 cc's of 40 mg Kenalog and 4 cc's of 0.25% bupivacaine was injected.  Ultrasound was used.  Ultrasound was needed to visualize the needle at the endpoint and the joint space.  Images were obtained in short views showing the injection.     A sterile dressing was applied.  Patient did tolerate procedure well.     ASSESSMENT & PLAN:   Sacroiliac joint dysfunction of right side Acutely occurring.  Severe pain exacerbated. -Counseled on home exercise therapy and supportive care. -Injection today. -Could consider further imaging for consideration of facet injections.

## 2021-08-16 NOTE — Patient Instructions (Signed)
Good to see you Please alternate heat and ice   Please send me a message in MyChart with any questions or updates.  Please see me back in 4-6 weeks.   --Dr. Raeford Razor

## 2021-08-17 ENCOUNTER — Encounter: Payer: Self-pay | Admitting: Family Medicine

## 2021-08-17 ENCOUNTER — Telehealth: Payer: Self-pay | Admitting: Family Medicine

## 2021-08-17 MED ORDER — HYDROCODONE-ACETAMINOPHEN 5-325 MG PO TABS: 1.0000 | ORAL_TABLET | Freq: Four times a day (QID) | ORAL | 0 refills | Status: DC | PRN

## 2021-08-17 NOTE — Telephone Encounter (Signed)
Provided norco refill.   Rosemarie Ax, MD [facility] 08/17/2021, 4:20 PM

## 2021-08-17 NOTE — Telephone Encounter (Signed)
Forwarding pt request to provider for Rx refill : Originally written by ED doctor:    HYDROcodone-acetaminophen (NORCO/VICODIN) 5-325 MG tablet [[id number]]    Order Details Dose: 1 tablet Route: Oral Frequency: Every 6 hours PRN  Dispense Quantity: 10 tablet Refills: 0        Sig: Take 1 tablet by mouth every 6 (six) hours as needed.         Pt uses :  [place], [place] Phone:  [phone]  Fax:  [phone]    -glh

## 2021-08-19 DIAGNOSIS — M4125 Other idiopathic scoliosis, thoracolumbar region: Secondary | ICD-10-CM | POA: Diagnosis not present

## 2021-08-19 DIAGNOSIS — M4316 Spondylolisthesis, lumbar region: Secondary | ICD-10-CM | POA: Diagnosis not present

## 2021-08-19 DIAGNOSIS — M545 Low back pain, unspecified: Secondary | ICD-10-CM | POA: Diagnosis not present

## 2021-08-23 ENCOUNTER — Encounter: Payer: Self-pay | Admitting: Family Medicine

## 2021-08-25 ENCOUNTER — Ambulatory Visit: Payer: PPO | Admitting: Family Medicine

## 2021-08-25 ENCOUNTER — Encounter: Payer: Self-pay | Admitting: Family Medicine

## 2021-08-25 ENCOUNTER — Other Ambulatory Visit (HOSPITAL_BASED_OUTPATIENT_CLINIC_OR_DEPARTMENT_OTHER): Payer: Self-pay | Admitting: Orthopedic Surgery

## 2021-08-25 DIAGNOSIS — M542 Cervicalgia: Secondary | ICD-10-CM | POA: Diagnosis not present

## 2021-08-25 DIAGNOSIS — M25552 Pain in left hip: Secondary | ICD-10-CM | POA: Diagnosis not present

## 2021-08-25 DIAGNOSIS — M4125 Other idiopathic scoliosis, thoracolumbar region: Secondary | ICD-10-CM

## 2021-08-25 DIAGNOSIS — M79652 Pain in left thigh: Secondary | ICD-10-CM | POA: Diagnosis not present

## 2021-08-25 DIAGNOSIS — M545 Low back pain, unspecified: Secondary | ICD-10-CM | POA: Diagnosis not present

## 2021-08-26 ENCOUNTER — Other Ambulatory Visit: Payer: Self-pay | Admitting: Family Medicine

## 2021-08-26 ENCOUNTER — Ambulatory Visit: Payer: PPO | Admitting: Family Medicine

## 2021-08-26 ENCOUNTER — Other Ambulatory Visit: Payer: Self-pay | Admitting: Medical

## 2021-08-26 MED ORDER — HYDROCODONE-ACETAMINOPHEN 5-325 MG PO TABS: 1.0000 | ORAL_TABLET | Freq: Three times a day (TID) | ORAL | 0 refills | Status: DC | PRN

## 2021-08-26 NOTE — Progress Notes (Signed)
Refilled norco.  ? ?Rosemarie Ax, MD ?[facility] ?08/26/2021, 7:51 AM ? ?

## 2021-08-27 ENCOUNTER — Other Ambulatory Visit: Payer: Self-pay | Admitting: Family Medicine

## 2021-08-28 ENCOUNTER — Ambulatory Visit (HOSPITAL_BASED_OUTPATIENT_CLINIC_OR_DEPARTMENT_OTHER)
Admission: RE | Admit: 2021-08-28 | Discharge: 2021-08-28 | Disposition: A | Discharge status: Home / Self Care | Payer: PPO | Source: Ambulatory Visit | Attending: Orthopedic Surgery | Admitting: Orthopedic Surgery

## 2021-08-28 DIAGNOSIS — M4125 Other idiopathic scoliosis, thoracolumbar region: Secondary | ICD-10-CM

## 2021-08-28 DIAGNOSIS — M47816 Spondylosis without myelopathy or radiculopathy, lumbar region: Secondary | ICD-10-CM | POA: Diagnosis not present

## 2021-08-31 ENCOUNTER — Other Ambulatory Visit: Payer: Self-pay

## 2021-08-31 ENCOUNTER — Ambulatory Visit: Payer: PPO

## 2021-09-01 DIAGNOSIS — M40295 Other kyphosis, thoracolumbar region: Secondary | ICD-10-CM | POA: Diagnosis not present

## 2021-09-01 DIAGNOSIS — M4125 Other idiopathic scoliosis, thoracolumbar region: Secondary | ICD-10-CM | POA: Diagnosis not present

## 2021-09-01 DIAGNOSIS — M4316 Spondylolisthesis, lumbar region: Secondary | ICD-10-CM | POA: Diagnosis not present

## 2021-09-01 DIAGNOSIS — M545 Low back pain, unspecified: Secondary | ICD-10-CM | POA: Diagnosis not present

## 2021-09-03 ENCOUNTER — Encounter: Payer: PPO | Admitting: Medical

## 2021-09-03 ENCOUNTER — Other Ambulatory Visit: Payer: Self-pay | Admitting: Family Medicine

## 2021-09-03 ENCOUNTER — Other Ambulatory Visit: Payer: PPO

## 2021-09-03 DIAGNOSIS — M2550 Pain in unspecified joint: Secondary | ICD-10-CM | POA: Diagnosis not present

## 2021-09-03 NOTE — Telephone Encounter (Signed)
Requesting: lorazepam '1mg'$   ?Contract: Will get today ?UDS: Will get today ?Last Visit: 07/26/2021 w/ Percell Miller ?Next Visit: 09/03/2021 w/ Percell Miller ?Last Refill: 12/10/2020 #30 and 2RF ? ?Please Advise ? ?

## 2021-09-04 ENCOUNTER — Encounter: Payer: Self-pay | Admitting: Medical

## 2021-09-06 ENCOUNTER — Ambulatory Visit: Payer: PPO | Admitting: Internal Medicine

## 2021-09-06 ENCOUNTER — Encounter: Payer: Self-pay | Admitting: Internal Medicine

## 2021-09-06 ENCOUNTER — Other Ambulatory Visit: Payer: Self-pay

## 2021-09-06 VITALS — BP 122/76 | HR 80 | Temp 98.1°F | Ht 62.0 in | Wt 152.6 lb

## 2021-09-06 DIAGNOSIS — K449 Diaphragmatic hernia without obstruction or gangrene: Secondary | ICD-10-CM | POA: Diagnosis not present

## 2021-09-06 DIAGNOSIS — B4481 Allergic bronchopulmonary aspergillosis: Secondary | ICD-10-CM | POA: Diagnosis not present

## 2021-09-06 DIAGNOSIS — J4550 Severe persistent asthma, uncomplicated: Secondary | ICD-10-CM

## 2021-09-06 LAB — ANTI-NUCLEAR AB-TITER (ANA TITER)
ANA TITER: 1:320 {titer} — ABNORMAL HIGH
ANA Titer 1: 1:80 {titer} — ABNORMAL HIGH

## 2021-09-06 LAB — RHEUMATOID FACTOR: Rheumatoid fact SerPl-aCnc: <14 [IU]/mL

## 2021-09-06 LAB — SEDIMENTATION RATE: Sed Rate: 6 mm/h (ref 0–30)

## 2021-09-06 LAB — URIC ACID: Uric Acid, Serum: 3 mg/dL (ref 2.5–7.0)

## 2021-09-06 LAB — ANA: Anti Nuclear Antibody (ANA): POSITIVE — AB

## 2021-09-06 LAB — C-REACTIVE PROTEIN: CRP: 1.2 mg/L

## 2021-09-06 NOTE — Progress Notes (Signed)
? ? ? ?OV 10/06/2016 ? ?Chief Complaint  ?Patient presents with  ? Follow-up  ?  Pt states her asthma is good; had one flare up since last visit. SOB with exertion, cough with yellow to brown mucus prodcution, with frequent wheezing. Notice improvement with being on [employer]   ? ?  ?66 year old female with severe asthma with ABPA and elevated IgE and eosinophilia. She is on IL5RAb antibody treatment now for the last few months. She feels this is helping her significantly. Her symptoms course of better. She is on Asmanex, Spiriva, Singulair as well. She is not on daily prednisone anymore. In December 2017 should she did grow MRSA from sputum culture. Currently a few days ago she developed sore throat and having some sputum production. She is worried this might be MRSA. Also asthma itself is stable and well controlled according to history. She is wondering about getting a sputum culture and really wishes for it ? ? ? ?OV 01/11/2017 ? ?Chief Complaint  ?Patient presents with  ? Follow-up  ?  Pt states her breathing is doing well. Pt states the last couple days she has had more wheezing. Pt states she has noticed an improvement being on Cinqair.   ? ?66 year old female with severe asthma with ABPA and elevated IgE and eosinophilia. She is on IL5RAb antibody treatment.  She is on Asmanex, Spiriva, Singulair as well. She is not on daily prednisone anymore ? ? ?01/11/2017 - Here for routine follow-up. She is on interleukin-5 receptor antibody IV infusion therapy. This was held a few weeks ago because of asthma exacerbation. She says approximately on Father's Day 2018 he started feeling unwell and then ended up with an exacerbation for which she needed antibiotics and prednisone. She is surprised by this decompensation despite being on this biologic immunotherapy for asthma. She is compliant with her Asmanex, Spiriva and Singulair. She is not on prednisone. Currently she feels baseline. Patient infusion #6 or 7 is due for  tomorrow [interleukin-5 receptor antibody). Overall she feels improved quality of life. Despite all this exhaled nitric oxide continues to be HIGH; unclear why ? ? ?OV 04/21/2017 ? ?Chief Complaint  ?Patient presents with  ? Follow-up  ?  Pt states that she has been doing good. Has had no flare-ups with her asthma. Mild coughing at night. Denies any SOB or CP.  ?severe asthma with ABPA and elevated IgE and eosinophilia. She is on IL5RAb antibody treatment.  She is on Asmanex, Spiriva, Singulair as well. She is not on daily prednisone anymore ? ?Continues cinqair spiriva, singulair, and asmanex. At this point in time the interleukin-5 receptor antibody is working really well for her. For nitric oxide today is lowest ever.. In terms of his symptoms she hardly ever gets up at night. When she wakes up she does not have any symptoms. She only has slight limitation with physical activity. She's not shortness of breath because of asthma not wheezing and not using albuterol for rescue use. The asthma control score is 0.8. However she is extremely frustrated by the [facility] gravida cycle management of her interleukin-5 receptor antibody infusions. She is getting different bills wrong bills missed bills. Therefore she wants to switch to subcutaneous injectable therapy. She is very upset about the quality of care that she has received at the infusion center because of billing practices. ? ?OV 10/19/2017 ? ?Chief Complaint  ?Patient presents with  ? Follow-up  ?  PFT done today. Pt states she has been  doing well. Has had a little more coughing and wheezing recently and thinks it could be due to the weather. Pt just got started back on Fasenra injections.  ? ?Severe persistent asthma with ABPA elevated IgE and eosinophilia. S/P cinqair interleukin-5 receptor antibody treatment ending" 2018 with good response but stopped secondary to billing errors at [facility] infusion center. Based on medical need then switched  to anti-eosinophil interleukin-5 receptor antibody subcutaneous injection treatment Fasenra early 2019. She is also on baseline Asmanex, Spiriva, Singulair and antihistamine 8. She is not on daily prednisone anymore. ? ?Last seen November 2018. Overall she was doing well with good effort tolerance but in March 2019 had some atypical chest painand ended up with a coronary artery stent in the abuse marginal. She is improved from that. She is wondering about taking a beta blocker safely. She does not feel that his anemia because of blood pressure is low but apparently cardiology is asked her to clear this with me. In the interim her IV injection interleukin-5 receptor antibody has been changed to subcutaneous injection FASENRA . Because of her cardiac issues she's only taken 3 doses since January 2019. She hopes to go back on schedule with this. She had billing issues at the [facility] infusion center with the IV infusion and that is why we switched to the subcutaneous injection. However, it looks like the workflow authorization for this was not done correctly and she isn't of the large co-pay. She is unhappy about this and routinely discussed this with the patient. In terms of asthma she feels her cough is decompensated in the last few weeks and this correlates with her not taking her Asmanex. Her lung function test is near normal today. Her exhaled nitric oxide is slightly elevated than the lowest that she's had but still better than her baseline average. Currently she is waking up at nightcoughing because of the spring pollen but only hardly. Very mild symptoms when she wakes up she is slightly limited in the daytime and she experiences shortness of breath little of the time not using albuterol for rescue. ? ? ? ?Results for Victoria [name], Victoria [name] (MRN [id number]) as of 10/19/2017 11:51 ? Ref. Range 10/19/2017 10:47  ?FEV1-Post Latest Units: L 1.80  ?FEV1-%Pred-Post Latest Units: % 76  ?FEV1-%Change-Post Latest Units: % 0   ?Results for Victoria [name], Victoria [name] (MRN [id number]) as of 10/19/2017 11:51 ? Ref. Range 10/19/2017 10:47  ?TLC Latest Units: L 4.90  ?TLC % pred Latest Units: % 103  ?Results for Victoria [name], Victoria [name] (MRN [id number]) as of 10/19/2017 11:51 ? Ref. Range 10/19/2017 10:47  ?DLCO unc Latest Units: ml/min/mmHg 19.51  ?DLCO unc % pred Latest Units: % 90  ? ? ?OV 05/14/2018 ? ?Subjective:  ?Patient ID: Victoria [name], female , DOB: [birth year] , age 74 y.o. , MRN: [id number] , ADDRESS: [address] ?[place] [id number] ? ? ?05/14/2018 -   ?Chief Complaint  ?Patient presents with  ? Follow-up  ?  breathing good, SOB on exertion  ? ?Severe persistent asthma with ABPA elevated IgE and eosinophilia. S/P cinqair interleukin-5 receptor antibody treatment ending" 2018 with good response but stopped secondary to billing errors at [facility] infusion center. Based on medical need then switched to anti-eosinophil interleukin-5 receptor antibody subcutaneous injection treatment Fasenra early 2019. She is also on baseline Asmanex, Spiriva, Singulair and antihistamine 8. She is not on daily prednisone anymore. ? ?Known hiatal hernia ? ?HPI ?Victoria [name] 66 y.o. -returns for follow-up of  severe persistent asthma.  Earlier this year we switched her to interleukin-5 receptor antibody subcutaneous injection may be [employer] called Fasenra.  With this she feels her asthma symptoms have improved dramatically.  She feels it is better than seeing care.  She still thankful to the scientist of developed this biologic.  Her exam nitric oxide is the lowest ever at 27.  She is not having any nighttime symptoms or any wheezing.  In fact she stopped her inhaled steroid partly because she was feeling well but partly also because she is in the donut hole.  However she is continuing her Spiriva and Singulair.  She is up-to-date with her flu shot.   ? ?She is dealing with skin infections.  Primary care physician notes reviewed.  There is question whether this is  being precipitated by the fact she is on to Biologics.  She thinks she had MRSA but when I reviewed the chart on May 08, 2018 shows MSSA.  Therefore she is been started on Augmentin today which she is yet t

## 2021-09-06 NOTE — Patient Instructions (Addendum)
?  Severe persistent asthma with intensive monitoring ?A B P A-ALLERGIC BRONCHOPULMONARY ASPERGILLOSIS ?Moderate persistent asthmatic bronchitis with acute exacerbation ? ?-At this point in time there is no evidence of Fasenra failure.  Asthma is well controlled ? ?Plan ?-continue with Fasenra and  retstart asmanex 2 puff twice daily ?-Certainly if you are having breakthrough respiratory exacerbations or need to go back on prednisone on a daily basis we can consider a newer biologic called  TEPEZULMAB ? ? ?Hiatal hernia ? ?Stable on CT chest oct 2022 ?Glad you are off fosfamax ? ?Plan ?-per pcp Mosie Lukes, MD and Dr Levie Heritage ? ?ANA positive ? ?Agre wtith rheum referral already in progrsss but most likely this is probably non specific ? ?Thoracic and lumbar an d hip pain ? ?- prob due to kyphoscoliosis ? ?Plan  ? -per your spine team ? ? ?Followup ?6 months or sooner if needed ?

## 2021-09-08 ENCOUNTER — Other Ambulatory Visit (HOSPITAL_BASED_OUTPATIENT_CLINIC_OR_DEPARTMENT_OTHER): Payer: Self-pay | Admitting: Orthopedic Surgery

## 2021-09-08 DIAGNOSIS — M4125 Other idiopathic scoliosis, thoracolumbar region: Secondary | ICD-10-CM

## 2021-09-10 ENCOUNTER — Other Ambulatory Visit (HOSPITAL_BASED_OUTPATIENT_CLINIC_OR_DEPARTMENT_OTHER): Payer: Self-pay | Admitting: Orthopedic Surgery

## 2021-09-10 DIAGNOSIS — M4125 Other idiopathic scoliosis, thoracolumbar region: Secondary | ICD-10-CM

## 2021-09-13 ENCOUNTER — Ambulatory Visit: Payer: PPO | Admitting: Family Medicine

## 2021-09-18 ENCOUNTER — Ambulatory Visit (HOSPITAL_BASED_OUTPATIENT_CLINIC_OR_DEPARTMENT_OTHER)
Admission: RE | Admit: 2021-09-18 | Discharge: 2021-09-18 | Disposition: A | Discharge status: Home / Self Care | Payer: PPO | Source: Ambulatory Visit | Attending: Orthopedic Surgery | Admitting: Orthopedic Surgery

## 2021-09-18 DIAGNOSIS — M4125 Other idiopathic scoliosis, thoracolumbar region: Secondary | ICD-10-CM

## 2021-09-18 DIAGNOSIS — M549 Dorsalgia, unspecified: Secondary | ICD-10-CM | POA: Diagnosis not present

## 2021-09-18 DIAGNOSIS — M2578 Osteophyte, vertebrae: Secondary | ICD-10-CM | POA: Diagnosis not present

## 2021-09-20 ENCOUNTER — Ambulatory Visit: Payer: PPO | Admitting: Family Medicine

## 2021-09-20 ENCOUNTER — Encounter: Payer: Self-pay | Admitting: Family Medicine

## 2021-09-20 VITALS — BP 134/80 | Ht 60.0 in | Wt 148.0 lb

## 2021-09-20 DIAGNOSIS — M810 Age-related osteoporosis without current pathological fracture: Secondary | ICD-10-CM | POA: Diagnosis not present

## 2021-09-20 DIAGNOSIS — M25552 Pain in left hip: Secondary | ICD-10-CM

## 2021-09-20 DIAGNOSIS — M5416 Radiculopathy, lumbar region: Secondary | ICD-10-CM

## 2021-09-20 DIAGNOSIS — M4120 Other idiopathic scoliosis, site unspecified: Secondary | ICD-10-CM

## 2021-09-20 NOTE — Patient Instructions (Signed)
Good to see you ?Please use heat as needed  ?Please try the epidural   ?Please send me a message in MyChart with any questions or updates.  ?Please see me back to start shockwave therapy .  ? ?--Dr. Raeford Razor ? ?

## 2021-09-20 NOTE — Assessment & Plan Note (Signed)
Acutely occurring.  Having pain localized to the lateral compartment.  Has gotten improvement with previous injection. ?-Counseled on home exercise therapy and supportive care. ?-Pursue shockwave therapy. ?-Could repeat injection if necessary. ?

## 2021-09-20 NOTE — Assessment & Plan Note (Signed)
Acutely occurring.  Having pain that radiates distally which seems more of a nerve component.  MRI was unrevealing for a significant contribution on the left side but does have more suggestion on the right. ?-Counseled on home exercise therapy and supportive care. ?-Epidural ?

## 2021-09-20 NOTE — Assessment & Plan Note (Signed)
She is currently on Prolia. ?

## 2021-09-20 NOTE — Assessment & Plan Note (Signed)
Acute on chronic in nature.  Her recent MRI of her lumbar spine was showing severe fatty change of the intrinsic muscles of the back.  This is likely contributing to her worsening back pain.  She does have a fusion at multiple lumbar levels.  There is no acute changes of a compression fracture on MRI or CT of the lumbar thoracic.  She has been going to physical therapy with Rexene Agent.  She has been taking tramadol for pain.  She does use the brace which does seem to help. ?-Counseled on home exercise therapy and supportive care. ?-Restart physical therapy. ?-Could consider facet injections. ?-Counseled on using the brace as needed. ?-Counseled on tramadol and could consider a pain medication as an as needed ?-Could consider nortriptyline ?

## 2021-09-20 NOTE — Progress Notes (Signed)
?Victoria [name] - 66 y.o. female MRN [id number]  Date of birth: 1955-07-17 ? ?SUBJECTIVE:  Including CC & ROS.  ?No chief complaint on file. ? ? ?Victoria [name] is a 66 y.o. female that is presenting with acute left-sided hip pain with radicular component down the left leg.  We have tried a greater trochanteric injection which did alleviate some of the pain.  She is also experiencing pain that radiates to the more distal past the knee.  Recent MRI of her lumbar spine was showing showing effusion at multiple levels. ? ? ?Review of Systems ?See HPI  ? ?HISTORY: Past Medical, Surgical, Social, and Family History Reviewed & Updated per EMR.   ?Pertinent Historical Findings include: ? ?Past Medical History:  ?Diagnosis Date  ? 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 sees Dr Edmund Hilda pulmonology  ? Anemia   ? iron deficiency, resolved  ? Anxiety   ? Asthma   ? CAD (coronary artery disease)   ? a. LHC 6/16:  oOM1 60, pRCA 25 >> med Rx b. cath 3/19 2nd OM with 95% stenosis s/p synergy DES & anomalous RCA  ? CAP (community acquired pneumonia) 2016; 06/07/2016  ? Chronic bronchitis ([place])   ? Chronic lower back pain   ? Complication of anesthesia   ? "think I have a hard time waking up from it"  ? COPD (chronic obstructive pulmonary disease) ([place])   ? Depression   ? mild  ? Diverticulitis   ? Diverticulosis   ? GERD (gastroesophageal reflux disease)   ? H/O hiatal hernia   ? Headache   ? "weekly" (08/23/2017)  ? History of echocardiogram   ? Echo 6/16:  Mod LVH, EF 60-65%, no RWMA, Gr 1 DD, trivial MR, normal LA size.  ? Hyperglycemia 11/20/2015  ? Hyperlipidemia, mixed 09/11/2007  ? Qualifier: Diagnosis of  By: Jerold Coombe   Did not tolerate Lipitor, zocor, Lovastatin, Pravastatin, Livalo, Crestor even low dose   ? IBS (irritable bowel syndrome)   ? Maxillary sinusitis   ? Normal cardiac stress test 11/2011  ? No evidence of ischemia or infarct.   Calculated ejection fraction 72%.  ? Obesity   ? OSA  (obstructive sleep apnea) 02/2012  ? has stopped using  cpap  ? Osteoarthritis   ? Osteoporosis   ? Pneumonia 11/2011  ? "before 2013 I hadn't had pneumonia since I was a child" (04/13/2012)  ? Pulmonary nodules   ? S/P angioplasty with stent 08/23/17 ostial 2nd OM with DES synnergy 08/24/2017  ? Schatzki's ring   ? ? ?Past Surgical History:  ?Procedure Laterality Date  ? APPENDECTOMY  1989  ? CARDIAC CATHETERIZATION N/A 11/25/2014  ? Procedure: Right/Left Heart Cath and Coronary Angiography;  Surgeon: Belva Crome, MD;  Location: [facility];  Service: Cardiovascular;  Laterality: N/A;  ? [place]  ? CORONARY ANGIOPLASTY WITH STENT PLACEMENT  08/23/2017  ? CORONARY STENT INTERVENTION N/A 08/23/2017  ? Procedure: CORONARY STENT INTERVENTION;  Surgeon: Burnell Blanks, MD;  Location: [facility];  Service: Cardiovascular;  Laterality: N/A;  ? HERNIA REPAIR  04/13/2012  ? [place] laparoscopic  ? LEFT HEART CATH AND CORONARY ANGIOGRAPHY N/A 08/23/2017  ? Procedure: LEFT HEART CATH AND CORONARY ANGIOGRAPHY;  Surgeon: Burnell Blanks, MD;  Location: [facility];  Service: Cardiovascular;  Laterality: N/A;  ? VENTRAL HERNIA REPAIR  04/13/2012  ? Procedure: LAPAROSCOPIC VENTRAL HERNIA;  Surgeon: Adin Hector,  MD;  Location: [facility];  Service: General;  Laterality: N/A;  laparoscopic repair of incarcerated hernia  ? ? ? ?PHYSICAL EXAM:  ?VS: BP 134/80   Ht 5' (1.524 m)   Wt 148 lb (67.1 kg)   BMI 28.90 kg/m?  ?Physical Exam ?Gen: NAD, alert, cooperative with exam, well-appearing ?MSK:  ?Neurovascularly intact   ? ? ? ? ?ASSESSMENT & PLAN:  ? ?Greater trochanteric pain syndrome of left lower extremity ?Acutely occurring.  Having pain localized to the lateral compartment.  Has gotten improvement with previous injection. ?-Counseled on home exercise therapy and supportive care. ?-Pursue shockwave therapy. ?-Could repeat injection if necessary. ? ?Lumbar radiculopathy ?Acutely occurring.   Having pain that radiates distally which seems more of a nerve component.  MRI was unrevealing for a significant contribution on the left side but does have more suggestion on the right. ?-Counseled on home exercise therapy and supportive care. ?-Epidural ? ?Idiopathic scoliosis and kyphoscoliosis ?Acute on chronic in nature.  Her recent MRI of her lumbar spine was showing severe fatty change of the intrinsic muscles of the back.  This is likely contributing to her worsening back pain.  She does have a fusion at multiple lumbar levels.  There is no acute changes of a compression fracture on MRI or CT of the lumbar thoracic.  She has been going to physical therapy with Rexene Agent.  She has been taking tramadol for pain.  She does use the brace which does seem to help. ?-Counseled on home exercise therapy and supportive care. ?-Restart physical therapy. ?-Could consider facet injections. ?-Counseled on using the brace as needed. ?-Counseled on tramadol and could consider a pain medication as an as needed ?-Could consider nortriptyline ? ?Osteoporosis ?She is currently on Prolia. ? ? ? ? ?

## 2021-09-21 ENCOUNTER — Telehealth: Payer: Self-pay | Admitting: Family Medicine

## 2021-09-21 NOTE — Telephone Encounter (Signed)
Answered her questions.  ? ?Rosemarie Ax, MD ?[facility] ?09/21/2021, 1:19 PM ? ?

## 2021-09-23 ENCOUNTER — Ambulatory Visit
Admission: EM | Admit: 2021-09-23 | Discharge: 2021-09-23 | Disposition: A | Discharge status: Home / Self Care | Payer: PPO | Attending: Physician Assistant | Admitting: Physician Assistant

## 2021-09-23 ENCOUNTER — Telehealth: Payer: Self-pay | Admitting: Family Medicine

## 2021-09-23 DIAGNOSIS — R197 Diarrhea, unspecified: Secondary | ICD-10-CM

## 2021-09-23 NOTE — ED Triage Notes (Signed)
Pt st she has been having loose BM since last Friday. Pt has been taking imodium for the last week but it isn't getting getting better. Pt reports cramping in LUA. Pt reports pmh of GI bleed. Pt son has been having loose BM since january which was new for him but she does not believe this is from him since she has not been the one caring for him. ?

## 2021-09-23 NOTE — Telephone Encounter (Signed)
Pt went to urgent care.

## 2021-09-23 NOTE — ED Provider Notes (Signed)
?[place] ? ? ? ?CSN: [id number] ?Arrival date & time: 09/23/21  1104 ? ? ?  ? ?History   ?Chief Complaint ?Chief Complaint  ?Patient presents with  ? Diarrhea  ? ? ?HPI ?Victoria [name] is a 66 y.o. female.  ? ?Patient here today for evaluation of diarrhea she has had the last 6 days. She reports this morning symptoms do seem to be somewhat improved. She notes prior to today she was having frequent Bms and was taking immodium with minimal relief. Last night she took some probiotics and aloe vera and thinks this might have helped her symptoms. She does have history of GI bleed but denies any blood in her stool or dark tarry stools. She has had mild abdominal pain with cramping but no severe abdominal pain. She has not had fever. She denies any vomiting.  ? ?The history is provided by the patient.  ?Diarrhea ?Associated symptoms: no abdominal pain, no chills, no fever and no vomiting   ? ?Past Medical History:  ?Diagnosis Date  ? Allergic bronchopulmonary aspergillosis ([place]) 2008  ? sees Dr Edmund Hilda pulmonology  ? Anemia   ? iron deficiency, resolved  ? Anxiety   ? Asthma   ? CAD (coronary artery disease)   ? a. LHC 6/16:  oOM1 60, pRCA 25 >> med Rx b. cath 3/19 2nd OM with 95% stenosis s/p synergy DES & anomalous RCA  ? CAP (community acquired pneumonia) 2016; 06/07/2016  ? Chronic bronchitis ([place])   ? Chronic lower back pain   ? Complication of anesthesia   ? "think I have a hard time waking up from it"  ? COPD (chronic obstructive pulmonary disease) ([place])   ? Depression   ? mild  ? Diverticulitis   ? Diverticulosis   ? GERD (gastroesophageal reflux disease)   ? H/O hiatal hernia   ? Headache   ? "weekly" (08/23/2017)  ? History of echocardiogram   ? Echo 6/16:  Mod LVH, EF 60-65%, no RWMA, Gr 1 DD, trivial MR, normal LA size.  ? Hyperglycemia 11/20/2015  ? Hyperlipidemia, mixed 09/11/2007  ? Qualifier: Diagnosis of  By: Jerold Coombe   Did not tolerate Lipitor, zocor, Lovastatin, Pravastatin,  Livalo, Crestor even low dose   ? IBS (irritable bowel syndrome)   ? Maxillary sinusitis   ? Normal cardiac stress test 11/2011  ? No evidence of ischemia or infarct.   Calculated ejection fraction 72%.  ? Obesity   ? OSA (obstructive sleep apnea) 02/2012  ? has stopped using  cpap  ? Osteoarthritis   ? Osteoporosis   ? Pneumonia 11/2011  ? "before 2013 I hadn't had pneumonia since I was a child" (04/13/2012)  ? Pulmonary nodules   ? S/P angioplasty with stent 08/23/17 ostial 2nd OM with DES synnergy 08/24/2017  ? Schatzki's ring   ? ? ?Patient Active Problem List  ? Diagnosis Date Noted  ? Lumbar radiculopathy 09/20/2021  ? Sacroiliac joint dysfunction of right side 08/16/2021  ? Greater trochanteric pain syndrome of left lower extremity 07/29/2021  ? Diverticulitis 07/07/2021  ? Iron deficiency anemia 09/21/2020  ? Family history of breast cancer 09/21/2020  ? Preventative health care 07/01/2020  ? Hemorrhoids 07/01/2020  ? LUQ discomfort 07/01/2020  ? Left shoulder pain 07/01/2020  ? Statin intolerance 01/10/2020  ? Occipital pain 06/27/2019  ? Tinnitus of both ears 06/27/2019  ? Statin myopathy 06/19/2019  ? Other cervical disc degeneration, unspecified cervical region 06/03/2019  ? Headache  03/19/2019  ? Tinnitus 03/19/2019  ? Heart disease 02/21/2019  ? Neck pain 02/17/2019  ? Toe pain, right 10/09/2018  ? Hx of cold sores 10/09/2018  ? Bronchitis with asthma, acute 07/18/2018  ? Osteoporosis 05/23/2018  ? S/P angioplasty with stent 08/23/17 ostial 2nd OM with DES synnergy 08/24/2017  ? CAD (coronary artery disease)   ? Low back pain 03/05/2017  ? Anxiety and depression 01/16/2017  ? Insomnia 01/16/2017  ? Acute recurrent sinusitis 05/27/2016  ? Muscle spasm 05/03/2016  ? Chronic pain 05/03/2016  ? Severe persistent asthma with intensive monitoring 03/22/2016  ? BMI 30.0-30.9,adult 02/18/2016  ? MRSA (methicillin resistant Staphylococcus aureus) colonization 01/12/2016  ? Sleep disorder due to stress 01/12/2016  ?  Hyperglycemia 11/20/2015  ? Chronic diastolic CHF (congestive heart failure), NYHA class 2 ([place]) 06/26/2015  ? Coronary artery disease due to lipid rich plaque 06/26/2015  ? 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 Cardiomegaly 11/21/2014  ? Anxiety state 08/18/2013  ? Seasonal and perennial allergic rhinitis 10/22/2011  ? Fatigue 09/01/2011  ? Chest pain, atypical 06/01/2011  ? OSA (obstructive sleep apnea) 11/13/2010  ? UMBILICAL HERNIA 59/16/3846  ? DIVERTICULOSIS, COLON 06/11/2010  ? HEPATIC CYST 06/11/2010  ? Idiopathic scoliosis and kyphoscoliosis 06/11/2010  ? TRANSAMINASES, SERUM, ELEVATED 05/27/2010  ? Abdominal pain 01/15/2010  ? ELEVATED BP READING WITHOUT DX HYPERTENSION 08/03/2009  ? Gastroesophageal reflux disease with hiatal hernia 10/21/2008  ? h/o PALPITATIONS 08/19/2008  ? CHONDROMALACIA OF PATELLA 04/15/2008  ? A B P A-ALLERGIC BRONCHOPULMONARY ASPERGILLOSIS 12/14/2007  ? Asthma 11/16/2007  ? Hyperlipidemia, mixed 09/11/2007  ? STRESS INCONTINENCE 09/11/2007  ? Cushing's syndrome ([place]) 12/11/2006  ? Osteoarthritis 12/11/2006  ? ? ?Past Surgical History:  ?Procedure Laterality Date  ? APPENDECTOMY  1989  ? CARDIAC CATHETERIZATION N/A 11/25/2014  ? Procedure: Right/Left Heart Cath and Coronary Angiography;  Surgeon: Belva Crome, MD;  Location: [facility];  Service: Cardiovascular;  Laterality: N/A;  ? [place]  ? CORONARY ANGIOPLASTY WITH STENT PLACEMENT  08/23/2017  ? CORONARY STENT INTERVENTION N/A 08/23/2017  ? Procedure: CORONARY STENT INTERVENTION;  Surgeon: Burnell Blanks, MD;  Location: [facility];  Service: Cardiovascular;  Laterality: N/A;  ? HERNIA REPAIR  04/13/2012  ? [place] laparoscopic  ? LEFT HEART CATH AND CORONARY ANGIOGRAPHY N/A 08/23/2017  ? Procedure: LEFT HEART CATH AND CORONARY ANGIOGRAPHY;  Surgeon: Burnell Blanks, MD;  Location: [facility];  Service: Cardiovascular;  Laterality: N/A;  ? VENTRAL HERNIA REPAIR  04/13/2012  ?  Procedure: LAPAROSCOPIC VENTRAL HERNIA;  Surgeon: Adin Hector, MD;  Location: [facility];  Service: General;  Laterality: N/A;  laparoscopic repair of incarcerated hernia  ? ? ?OB History   ?No obstetric history on file. ?  ? ? ? ?Home Medications   ? ?Prior to Admission medications   ?Medication Sig Start Date End Date Taking? Authorizing Provider  ?acyclovir ointment (ZOVIRAX) 5 % Apply 1 application topically every 3 (three) hours. 10/09/18   Mosie Lukes, MD  ?albuterol (VENTOLIN HFA) 108 (90 Base) MCG/ACT inhaler Inhale 1-2 puffs into the lungs every 6 (six) hours as needed. 03/17/21   Parrett, Fonnie Mu, NP  ?aspirin 81 MG tablet Take 81 mg by mouth daily.    [provider]  ?Benralizumab (FASENRA PEN) 30 MG/ML SOAJ Inject 1 mL (30 mg total) into the skin every 8 (eight) weeks. 07/26/21   Brand Males, MD  ?Bioflavonoid Products (ESTER C PO) Take  500 mg by mouth daily.    [provider]  ?Calcium-Magnesium-Vitamin D (CALCIUM MAGNESIUM PO) Take 1 tablet by mouth daily.    [provider]  ?denosumab (PROLIA) 60 MG/ML SOSY injection Inject 60 mg into the skin every 6 (six) months.    [provider]  ?Evolocumab (REPATHA SURECLICK) 563 MG/ML SOAJ INJECT 1 PEN INTO THE SKIN EVERY 14 DAYS 05/10/21   Freada Bergeron, MD  ?famotidine (PEPCID) 20 MG tablet TAKE 1 TABLET (20 MG TOTAL) BY MOUTH AT BEDTIME. 09/21/20   Pyrtle, Lajuan Lines, MD  ?HYDROcodone-acetaminophen (NORCO/VICODIN) 5-325 MG tablet Take 1 tablet by mouth every 8 (eight) hours as needed. 08/26/21   Rosemarie Ax, MD  ?ipratropium-albuterol (DUONEB) 0.5-2.5 (3) MG/3ML SOLN USE 1 VIAL BY NEBULIZATION EVERY 6 (SIX) HOURS AS NEEDED. 07/26/21   Parrett, Fonnie Mu, NP  ?LORazepam (ATIVAN) 1 MG tablet TAKE 1/2 TO 1 TABLET BY MOUTH 2 TIMES A DAY AS NEEDED FOR ANXIETY OR SLEEP 09/03/21   Wendling, [place] Oyster, DO  ?meloxicam (MOBIC) 7.5 MG tablet TAKE 1 TO 2 TABLETS BY MOUTH DAILY 08/26/21   Mosie Lukes, MD  ?nitroGLYCERIN  (NITROSTAT) 0.4 MG SL tablet PLACE 1 TABLET UNDER THE TONGUE EVERY 5 MINUTES AS NEEDED FOR CHEST PAIN. 05/05/20   Dorothy Spark, MD  ?venlafaxine XR (EFFEXOR-XR) 150 MG 24 hr capsule TAKE 1 CAPSULE BY MOUT

## 2021-09-23 NOTE — Telephone Encounter (Signed)
[facility] states pt has had diarrhea for the last 6 days. She tried to imodium, but it is barely helping. She is going to the ED now. Caller left # if Dr.Blyth needed to speak with her.  ?

## 2021-09-24 ENCOUNTER — Inpatient Hospital Stay: Admission: RE | Admit: 2021-09-24 | Payer: Self-pay | Source: Ambulatory Visit

## 2021-09-24 LAB — COMPREHENSIVE METABOLIC PANEL
ALT: 24 IU/L (ref 0–32)
AST: 20 IU/L (ref 0–40)
Albumin/Globulin Ratio: 2.5 — ABNORMAL HIGH (ref 1.2–2.2)
Albumin: 4.5 g/dL (ref 3.8–4.8)
Alkaline Phosphatase: 63 IU/L (ref 44–121)
BUN/Creatinine Ratio: 37 — ABNORMAL HIGH (ref 12–28)
BUN: 21 mg/dL (ref 8–27)
Bilirubin Total: <0.2 mg/dL (ref 0.0–1.2)
CO2: 21 mmol/L (ref 20–29)
Calcium: 9.2 mg/dL (ref 8.7–10.3)
Chloride: 99 mmol/L (ref 96–106)
Creatinine, Ser: 0.57 mg/dL (ref 0.57–1.00)
Globulin, Total: 1.8 g/dL (ref 1.5–4.5)
Glucose: 90 mg/dL (ref 70–99)
Potassium: 4.2 mmol/L (ref 3.5–5.2)
Sodium: 135 mmol/L (ref 134–144)
Total Protein: 6.3 g/dL (ref 6.0–8.5)
eGFR: 101 mL/min/{1.73_m2} (ref >59)

## 2021-09-24 LAB — CBC WITH DIFFERENTIAL/PLATELET
Basophils Absolute: 0 10*3/uL (ref 0.0–0.2)
Basos: 0 %
EOS (ABSOLUTE): 0 10*3/uL (ref 0.0–0.4)
Eos: 0 %
Hematocrit: 38.7 % (ref 34.0–46.6)
Hemoglobin: 12.9 g/dL (ref 11.1–15.9)
Immature Grans (Abs): 0 10*3/uL (ref 0.0–0.1)
Immature Granulocytes: 0 %
Lymphocytes Absolute: 1.3 10*3/uL (ref 0.7–3.1)
Lymphs: 16 %
MCH: 30.1 pg (ref 26.6–33.0)
MCHC: 33.3 g/dL (ref 31.5–35.7)
MCV: 90 fL (ref 79–97)
Monocytes Absolute: 0.6 10*3/uL (ref 0.1–0.9)
Monocytes: 7 %
Neutrophils Absolute: 6.6 10*3/uL (ref 1.4–7.0)
Neutrophils: 77 %
Platelets: 303 10*3/uL (ref 150–450)
RBC: 4.29 x10E6/uL (ref 3.77–5.28)
RDW: 13.1 % (ref 11.7–15.4)
WBC: 8.5 10*3/uL (ref 3.4–10.8)

## 2021-09-27 ENCOUNTER — Encounter: Payer: Self-pay | Admitting: Family Medicine

## 2021-09-27 ENCOUNTER — Ambulatory Visit
Admission: RE | Admit: 2021-09-27 | Discharge: 2021-09-27 | Disposition: A | Discharge status: Home / Self Care | Payer: PPO | Source: Ambulatory Visit | Attending: Family Medicine | Admitting: Family Medicine

## 2021-09-27 DIAGNOSIS — M4186 Other forms of scoliosis, lumbar region: Secondary | ICD-10-CM | POA: Diagnosis not present

## 2021-09-27 DIAGNOSIS — M5416 Radiculopathy, lumbar region: Secondary | ICD-10-CM

## 2021-09-27 MED ORDER — IOPAMIDOL (ISOVUE-M 200) INJECTION 41%: 1.0000 mL | Freq: Once | INTRAMUSCULAR | Status: DC

## 2021-09-27 MED ORDER — METHYLPREDNISOLONE ACETATE 40 MG/ML INJ SUSP (RADIOLOG: 80.0000 mg | Freq: Once | INTRAMUSCULAR | Status: DC

## 2021-09-27 NOTE — Discharge Instructions (Signed)

## 2021-09-27 NOTE — Progress Notes (Deleted)
?Cardiology Office Note:   ? ?Date:  09/27/2021  ? ?ID:  LASONJA LAKINS, DOB [birth year], MRN [id number] ? ?PCP:  Mosie Lukes, MD ?  ?[facility] Providers ?Cardiologist:  Ena Dawley, MD {  ? ?Referring MD: Mosie Lukes, MD  ? ? ?History of Present Illness:   ? ?IVANNAH ZODY is a 66 y.o. female with a hx of CAD s/p DES to OM on 08/2017, HLD on PCSK9i, anxiety and OSA on CPAP who was previously followed by Dr. Meda Coffee who now returns to clinic for follow-up. ? ?Per review of the record, the patient has a long history of chest pain. Myoview 02/2019 low risk. Chest CT 04/02/20 showed chronic scarring in right lower lobe nodule and large hiatal hernia which was felt to be causing some of her symptoms. ? ?Last seen in clinic on 10/2020 where she was doing well from a CV standpoint. ? ?Today, *** ? ?Past Medical History:  ?Diagnosis Date  ? Allergic bronchopulmonary aspergillosis ([place]) 2008  ? sees Dr Edmund Hilda pulmonology  ? Anemia   ? iron deficiency, resolved  ? Anxiety   ? Asthma   ? CAD (coronary artery disease)   ? a. LHC 6/16:  oOM1 60, pRCA 25 >> med Rx b. cath 3/19 2nd OM with 95% stenosis s/p synergy DES & anomalous RCA  ? CAP (community acquired pneumonia) 2016; 06/07/2016  ? Chronic bronchitis ([place])   ? Chronic lower back pain   ? Complication of anesthesia   ? "think I have a hard time waking up from it"  ? COPD (chronic obstructive pulmonary disease) ([place])   ? Depression   ? mild  ? Diverticulitis   ? Diverticulosis   ? GERD (gastroesophageal reflux disease)   ? H/O hiatal hernia   ? Headache   ? "weekly" (08/23/2017)  ? History of echocardiogram   ? Echo 6/16:  Mod LVH, EF 60-65%, no RWMA, Gr 1 DD, trivial MR, normal LA size.  ? Hyperglycemia 11/20/2015  ? Hyperlipidemia, mixed 09/11/2007  ? Qualifier: Diagnosis of  By: Jerold Coombe   Did not tolerate Lipitor, zocor, Lovastatin, Pravastatin, Livalo, Crestor even low dose   ? IBS (irritable bowel syndrome)   ? Maxillary sinusitis   ?  Normal cardiac stress test 11/2011  ? No evidence of ischemia or infarct.   Calculated ejection fraction 72%.  ? Obesity   ? OSA (obstructive sleep apnea) 02/2012  ? has stopped using  cpap  ? Osteoarthritis   ? Osteoporosis   ? Pneumonia 11/2011  ? "before 2013 I hadn't had pneumonia since I was a child" (04/13/2012)  ? Pulmonary nodules   ? S/P angioplasty with stent 08/23/17 ostial 2nd OM with DES synnergy 08/24/2017  ? Schatzki's ring   ? ? ?Past Surgical History:  ?Procedure Laterality Date  ? APPENDECTOMY  1989  ? CARDIAC CATHETERIZATION N/A 11/25/2014  ? Procedure: Right/Left Heart Cath and Coronary Angiography;  Surgeon: Belva Crome, MD;  Location: [facility];  Service: Cardiovascular;  Laterality: N/A;  ? [place]  ? CORONARY ANGIOPLASTY WITH STENT PLACEMENT  08/23/2017  ? CORONARY STENT INTERVENTION N/A 08/23/2017  ? Procedure: CORONARY STENT INTERVENTION;  Surgeon: Burnell Blanks, MD;  Location: [facility];  Service: Cardiovascular;  Laterality: N/A;  ? HERNIA REPAIR  04/13/2012  ? [place] laparoscopic  ? LEFT HEART CATH AND CORONARY ANGIOGRAPHY N/A 08/23/2017  ? Procedure: LEFT HEART CATH AND CORONARY ANGIOGRAPHY;  Surgeon: Burnell Blanks, MD;  Location: [facility];  Service: Cardiovascular;  Laterality: N/A;  ? VENTRAL HERNIA REPAIR  04/13/2012  ? Procedure: LAPAROSCOPIC VENTRAL HERNIA;  Surgeon: Adin Hector, MD;  Location: [facility];  Service: General;  Laterality: N/A;  laparoscopic repair of incarcerated hernia  ? ? ?Current Medications: ?No outpatient medications have been marked as taking for the 10/05/21 encounter (Appointment) with Freada Bergeron, MD.  ?  ? ?Allergies:   Beclomethasone dipropionate, Flexeril [cyclobenzaprine], Mometasone furo-formoterol fum, Sulfonamide derivatives, and Statins  ? ?Social History  ? ?Socioeconomic History  ? Marital status: Married  ?  Spouse name: Not on file  ? Number of children: 1  ? Years of education: 69  ?  Highest education level: Associate degree: academic program  ?Occupational History  ? Occupation: Disabled   ?Tobacco Use  ? Smoking status: Never  ? Smokeless tobacco: Never  ?Vaping Use  ? Vaping Use: Never used  ?Substance and Sexual Activity  ? Alcohol use: Yes  ?  Comment: social use  ? Drug use: No  ? Sexual activity: Yes  ?  Comment: gluten free, lives with husband and son with CP quadriplegia  ?Other Topics Concern  ? Not on file  ?Social History Narrative  ? 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   ? Lives at home with her husband and son.  ? Right-handed.  ? 2 cups caffeine per day.  ? One story home  ? ?Social Determinants of Health  ? ?Financial Resource Strain: Not on file  ?Food Insecurity: Not on file  ?Transportation Needs: Not on file  ?Physical Activity: Not on file  ?Stress: Not on file  ?Social Connections: Not on file  ?  ? ?Family History: ?The patient's family history includes Breast cancer in her mother and sister; Cancer in her mother and sister; Cancer (age of onset: 64) in her niece; Diabetes in her mother; Diverticulosis in her father; Heart attack in her maternal grandfather; Hypertension in her mother; Osteoporosis in her niece; Prostate cancer in her father; Pulmonary embolism in her brother. There is no history of Stroke, Colon cancer, Esophageal cancer, Stomach cancer, or Rectal cancer. ? ?ROS:   ?Please see the history of present illness.    ?Review of Systems  ?Constitutional:  Negative for chills and fever.  ?HENT:  Negative for sore throat.   ?Eyes:  Negative for redness.  ?Respiratory:  Positive for cough.   ?Cardiovascular:  Negative for chest pain, palpitations, orthopnea, claudication, leg swelling and PND.  ?Gastrointestinal:  Positive for heartburn. Negative for blood in stool and melena.  ?Genitourinary:  Negative for hematuria.  ?Neurological:  Negative for dizziness and loss of consciousness.  ?Psychiatric/Behavioral:  Negative for substance abuse.   ? ?EKGs/Labs/Other  Studies Reviewed:   ? ?The following studies were reviewed today: ?Echocardiogram 03/20/2019 ?IMPRESSIONS ? 1. Left ventricular ejection fraction, by visual estimation, is 60 to 65%. The left ventricle has normal function. Normal left ventricular size. Mildly increased left ventricular posterior wall thickness. There is mildly increased left ventricular  ?hypertrophy. ? 2. Elevated left ventricular end-diastolic pressure. ? 3. Left ventricular diastolic Doppler parameters are consistent with impaired relaxation pattern of LV diastolic filling. ? 4. Global right ventricle has normal systolic function.The right ventricular size is normal. No increase in right ventricular wall thickness. ? 5. Left atrial size was normal. ? 6. Right atrial size was normal. ? 7. The mitral valve is normal in structure. No evidence of mitral  valve regurgitation. No evidence of mitral stenosis. ? 8. The tricuspid valve is normal in structure. Tricuspid valve regurgitation is trivial. ? 9. The aortic valve is normal in structure. Aortic valve regurgitation is mild by color flow Doppler. Structurally normal aortic valve, with no evidence of sclerosis or stenosis. ?10. The pulmonic valve was normal in structure. Pulmonic valve regurgitation is not visualized by color flow Doppler. ?11. Aortic dilatation noted. ?12. There is mild dilatation of the ascending aorta measuring 36 mm. ?13. Normal pulmonary artery systolic pressure. ?14. The inferior vena cava is normal in size with greater than 50% respiratory variability, suggesting right atrial pressure of 3 mmHg. ?  ?Exercise Myoview 03/15/2019 ?Study Highlights ?  ?Nuclear stress EF: 74%. ?There was no ST segment deviation noted during stress. ?No T wave inversion was noted during stress. ?Blood pressure demonstrated a hypertensive response to exercise. ?The study is normal. ?This is a low risk study. ?  ?Low risk stress nuclear study with normal perfusion and normal left ventricular regional and  global systolic function. ?   ?  ?Carotid ultrasound 03/12/2019 ?Summary: ?Right Carotid: Velocities in the right ICA are consistent with a 1-39% stenosis. ?  ?Left Carotid: Velocities in the left ICA are consiste

## 2021-09-28 ENCOUNTER — Ambulatory Visit: Payer: PPO | Admitting: Family Medicine

## 2021-09-29 ENCOUNTER — Telehealth: Payer: Self-pay | Admitting: Nurse Practitioner

## 2021-09-29 NOTE — Telephone Encounter (Signed)
Inbound call from patient reports she is experiencing tender and hurting in her lower left side. States it could be a flare ?

## 2021-09-29 NOTE — Telephone Encounter (Signed)
Spoke with pt and pt states she was in the urgent care on Thursday 4/6 for diarrhea. Pt reports diarrhea started around Friday 3/31 and initially while she was having diarrhea she had abd cramping and her stomach was constantly rumbling. On Friday 4/7 pt states she had 6-8 episodes of diarrhea in 2 hours. Pt states over the weekend diarrhea resolved and on M, T, W this week she has had 1-2 formed bowel movements per day. While she was having diarrhea she was taking probiotics, aloe vera, and imodium which she reported helped. The LLQ abd pain began Friday 4/7. Pt states pain is moderate at 4 or 5/10 and is also across her lower abd. Pt denies fever, blood in stools, and vomiting. Pt stated she had slight nausea on Monday. Pt also reports she has had a headache that started with diarrhea and HA is still present. Pt has been taking advil for headache. Please advise.  ?

## 2021-09-30 ENCOUNTER — Ambulatory Visit: Payer: PPO | Admitting: Family Medicine

## 2021-09-30 ENCOUNTER — Encounter: Payer: Self-pay | Admitting: Cardiology

## 2021-09-30 ENCOUNTER — Telehealth: Payer: Self-pay

## 2021-09-30 NOTE — Telephone Encounter (Signed)
Pt states she had 2 episodes of diarrhea last night. Pt denies fever. Pt thought that pain might have felt like diverticulitis but stated that yesterday pain improved. Pt states she still has some pain today but pain has significantly improved since Saturday.  ?

## 2021-09-30 NOTE — Telephone Encounter (Signed)
Last Prolia inj 05/12/21 ?Next Prolia inj due 11/10/21 ?

## 2021-10-01 NOTE — Telephone Encounter (Signed)
Spoke with pt and let her know that if she is having persistent diarrhea then Victoria [name] wanted to get a stool test to check for C. Diff. Pt stated that her diarrhea and abd pain have resolved. Pt stated that she has been under a lot of stress lately and her sister has IBS. Pt wanted to schedule a follow up to discuss if her past symptoms could have been related to IBS. Follow up appt scheduled with Tye Savoy, NP on 10/21/21 at 11 am. Pt verbalized understanding and had no other concerns at end of call.  ?

## 2021-10-01 NOTE — Telephone Encounter (Signed)
Left message for pt to call back  °

## 2021-10-04 ENCOUNTER — Telehealth: Payer: Self-pay | Admitting: Medical

## 2021-10-04 ENCOUNTER — Ambulatory Visit: Payer: PPO | Admitting: Cardiology

## 2021-10-04 ENCOUNTER — Ambulatory Visit (INDEPENDENT_AMBULATORY_CARE_PROVIDER_SITE_OTHER): Payer: PPO | Admitting: Medical

## 2021-10-04 ENCOUNTER — Encounter: Payer: Self-pay | Admitting: Family Medicine

## 2021-10-04 ENCOUNTER — Ambulatory Visit (INDEPENDENT_AMBULATORY_CARE_PROVIDER_SITE_OTHER): Payer: Self-pay | Admitting: Family Medicine

## 2021-10-04 VITALS — BP 127/70 | HR 77 | Temp 98.5°F | Resp 18 | Ht 60.0 in | Wt 153.0 lb

## 2021-10-04 DIAGNOSIS — M533 Sacrococcygeal disorders, not elsewhere classified: Secondary | ICD-10-CM

## 2021-10-04 DIAGNOSIS — R768 Other specified abnormal immunological findings in serum: Secondary | ICD-10-CM | POA: Diagnosis not present

## 2021-10-04 DIAGNOSIS — M4120 Other idiopathic scoliosis, site unspecified: Secondary | ICD-10-CM

## 2021-10-04 DIAGNOSIS — M25552 Pain in left hip: Secondary | ICD-10-CM

## 2021-10-04 DIAGNOSIS — M2550 Pain in unspecified joint: Secondary | ICD-10-CM

## 2021-10-04 DIAGNOSIS — G4700 Insomnia, unspecified: Secondary | ICD-10-CM

## 2021-10-04 NOTE — Telephone Encounter (Signed)
Wanted your opinion on pt of yours. She has history of depression, anxiety and varoius joint pain. Recent + ana with titer elevation. Referring to rheumatologist. Pt is on high dose effexor about 187.5 mg one day and 150 mg next day. ? ?She wants to switch to cymbalta. Wanted to know your opinion. Want to avoid serotonin syndrome. What would your approach be on tapers. Would you in end use possible comtination effexor and cymbalta and if so what dose. ?

## 2021-10-04 NOTE — Assessment & Plan Note (Signed)
Completed shockwave therapy  

## 2021-10-04 NOTE — Progress Notes (Addendum)
? ?Subjective:  ? ? Patient ID: Victoria [name], female    DOB: 01-14-1956, 66 y.o.   MRN: [id number] ? ?HPI ? ?Pt in for follow up. ? ? ?Pt has back pain and has been seeing Dr. Kathreen Cosier.pt will be seeing at 2:50. ? ? ?Pt has chronic pain in hips, back and arthralgias. Positive ANA with elevated titer.  ? ?She states she has been trying to wean herself off of lorazepam. She states in past taken for insomnia since she feels like "can't turn her brain off". Pt has been on half tab at night for one week. Last night did not take any and did well. She did try melatonin. She is only on 1 mg dose. ? ?Pt on effexor for depression and anxiety. Other ssri she used caused severe night mares.  Pt wants to consider switching to cymbalta as she thinks may help her various areas of pain. Pt has also backed down off effexor. On 150 mg effexor daily and next day 187.5 mg other day. ? ? ? ? ?Review of Systems  ?Constitutional:  Negative for chills, fatigue and fever.  ?Respiratory:  Negative for cough, chest tightness, shortness of breath and wheezing.   ?Cardiovascular:  Negative for chest pain and palpitations.  ?Gastrointestinal:  Negative for abdominal pain and blood in stool.  ?Musculoskeletal:  Positive for arthralgias.  ?Skin:  Negative for rash.  ?Neurological:  Negative for dizziness, seizures, syncope, weakness and headaches.  ?Hematological:  Negative for adenopathy. Does not bruise/bleed easily.  ?Psychiatric/Behavioral:  Negative for behavioral problems.   ? ?Past Medical History:  ?Diagnosis Date  ? 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 sees Dr Edmund Hilda pulmonology  ? Anemia   ? iron deficiency, resolved  ? Anxiety   ? Asthma   ? CAD (coronary artery disease)   ? a. LHC 6/16:  oOM1 60, pRCA 25 >> med Rx b. cath 3/19 2nd OM with 95% stenosis s/p synergy DES & anomalous RCA  ? CAP (community acquired pneumonia) 2016; 06/07/2016  ? Chronic bronchitis ([place])   ? Chronic lower back pain   ?  Complication of anesthesia   ? "think I have a hard time waking up from it"  ? COPD (chronic obstructive pulmonary disease) ([place])   ? Depression   ? mild  ? Diverticulitis   ? Diverticulosis   ? GERD (gastroesophageal reflux disease)   ? H/O hiatal hernia   ? Headache   ? "weekly" (08/23/2017)  ? History of echocardiogram   ? Echo 6/16:  Mod LVH, EF 60-65%, no RWMA, Gr 1 DD, trivial MR, normal LA size.  ? Hyperglycemia 11/20/2015  ? Hyperlipidemia, mixed 09/11/2007  ? Qualifier: Diagnosis of  By: Jerold Coombe   Did not tolerate Lipitor, zocor, Lovastatin, Pravastatin, Livalo, Crestor even low dose   ? IBS (irritable bowel syndrome)   ? Maxillary sinusitis   ? Normal cardiac stress test 11/2011  ? No evidence of ischemia or infarct.   Calculated ejection fraction 72%.  ? Obesity   ? OSA (obstructive sleep apnea) 02/2012  ? has stopped using  cpap  ? Osteoarthritis   ? Osteoporosis   ? Pneumonia 11/2011  ? "before 2013 I hadn't had pneumonia since I was a child" (04/13/2012)  ? Pulmonary nodules   ? S/P angioplasty with stent 08/23/17 ostial 2nd OM with DES synnergy 08/24/2017  ? Schatzki's ring   ? ?  ?Social History  ? ?Socioeconomic History  ? Marital status:  Married  ?  Spouse name: Not on file  ? Number of children: 1  ? Years of education: 65  ? Highest education level: Associate degree: academic program  ?Occupational History  ? Occupation: Disabled   ?Tobacco Use  ? Smoking status: Never  ? Smokeless tobacco: Never  ?Vaping Use  ? Vaping Use: Never used  ?Substance and Sexual Activity  ? Alcohol use: Yes  ?  Comment: social use  ? Drug use: No  ? Sexual activity: Yes  ?  Comment: gluten free, lives with husband and son with CP quadriplegia  ?Other Topics Concern  ? Not on file  ?Social History Narrative  ? 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   ? Lives at home with her husband and son.  ? Right-handed.  ? 2 cups caffeine per day.  ? One story home  ? ?Social Determinants of Health  ? ?Financial Resource Strain: Not on  file  ?Food Insecurity: Not on file  ?Transportation Needs: Not on file  ?Physical Activity: Not on file  ?Stress: Not on file  ?Social Connections: Not on file  ?Intimate Partner Violence: Not on file  ? ? ?Past Surgical History:  ?Procedure Laterality Date  ? APPENDECTOMY  1989  ? CARDIAC CATHETERIZATION N/A 11/25/2014  ? Procedure: Right/Left Heart Cath and Coronary Angiography;  Surgeon: Belva Crome, MD;  Location: [facility];  Service: Cardiovascular;  Laterality: N/A;  ? [place]  ? CORONARY ANGIOPLASTY WITH STENT PLACEMENT  08/23/2017  ? CORONARY STENT INTERVENTION N/A 08/23/2017  ? Procedure: CORONARY STENT INTERVENTION;  Surgeon: Burnell Blanks, MD;  Location: [facility];  Service: Cardiovascular;  Laterality: N/A;  ? HERNIA REPAIR  04/13/2012  ? [place] laparoscopic  ? LEFT HEART CATH AND CORONARY ANGIOGRAPHY N/A 08/23/2017  ? Procedure: LEFT HEART CATH AND CORONARY ANGIOGRAPHY;  Surgeon: Burnell Blanks, MD;  Location: [facility];  Service: Cardiovascular;  Laterality: N/A;  ? VENTRAL HERNIA REPAIR  04/13/2012  ? Procedure: LAPAROSCOPIC VENTRAL HERNIA;  Surgeon: Adin Hector, MD;  Location: [facility];  Service: General;  Laterality: N/A;  laparoscopic repair of incarcerated hernia  ? ? ?Family History  ?Problem Relation Age of Onset  ? Breast cancer Mother   ? Hypertension Mother   ? Diabetes Mother   ? Cancer Mother   ?     recurrent, metastatic breast cancer.   ? Diverticulosis Father   ? Prostate cancer Father   ? Breast cancer Sister   ? Cancer Sister   ?     breast cancer, invasive ductal carcinoma in 2022,DCIS at 41 with 4 weeks of radiation, 5 years of Tamoxifen   ? Pulmonary embolism Brother   ?     recurrent  ? Heart attack Maternal Grandfather   ? Cancer Niece 73  ? Osteoporosis Niece   ? Stroke Neg Hx   ? Colon cancer Neg Hx   ? Esophageal cancer Neg Hx   ? Stomach cancer Neg Hx   ? Rectal cancer Neg Hx   ? ? ?Allergies  ?Allergen Reactions  ?  Beclomethasone Dipropionate Hives and Other (See Comments)  ?   weight gain  ? Flexeril [Cyclobenzaprine] Anxiety  ? Mometasone Furo-Formoterol Fum Hives and Other (See Comments)  ?  weight gain  ? Sulfonamide Derivatives Hives and Rash  ? Statins   ?  Myalgias, RLS  ? ? ?Current Outpatient Medications on File Prior to Visit  ?Medication  Sig Dispense Refill  ? acyclovir ointment (ZOVIRAX) 5 % Apply 1 application topically every 3 (three) hours. 15 g 2  ? albuterol (VENTOLIN HFA) 108 (90 Base) MCG/ACT inhaler Inhale 1-2 puffs into the lungs every 6 (six) hours as needed. 8 g 2  ? aspirin 81 MG tablet Take 81 mg by mouth daily.    ? Benralizumab (FASENRA PEN) 30 MG/ML SOAJ Inject 1 mL (30 mg total) into the skin every 8 (eight) weeks. 1 mL 2  ? Bioflavonoid Products (ESTER C PO) Take 500 mg by mouth daily.    ? Calcium-Magnesium-Vitamin D (CALCIUM MAGNESIUM PO) Take 1 tablet by mouth daily.    ? denosumab (PROLIA) 60 MG/ML SOSY injection Inject 60 mg into the skin every 6 (six) months.    ? Evolocumab (REPATHA SURECLICK) 244 MG/ML SOAJ INJECT 1 PEN INTO THE SKIN EVERY 14 DAYS 6 mL 3  ? famotidine (PEPCID) 20 MG tablet TAKE 1 TABLET (20 MG TOTAL) BY MOUTH AT BEDTIME. 30 tablet 3  ? HYDROcodone-acetaminophen (NORCO/VICODIN) 5-325 MG tablet Take 1 tablet by mouth every 8 (eight) hours as needed. 12 tablet 0  ? ipratropium-albuterol (DUONEB) 0.5-2.5 (3) MG/3ML SOLN USE 1 VIAL BY NEBULIZATION EVERY 6 (SIX) HOURS AS NEEDED. 360 mL 5  ? LORazepam (ATIVAN) 1 MG tablet TAKE 1/2 TO 1 TABLET BY MOUTH 2 TIMES A DAY AS NEEDED FOR ANXIETY OR SLEEP 30 tablet 2  ? meloxicam (MOBIC) 7.5 MG tablet TAKE 1 TO 2 TABLETS BY MOUTH DAILY 60 tablet 0  ? nitroGLYCERIN (NITROSTAT) 0.4 MG SL tablet PLACE 1 TABLET UNDER THE TONGUE EVERY 5 MINUTES AS NEEDED FOR CHEST PAIN. 25 tablet 4  ? venlafaxine XR (EFFEXOR-XR) 150 MG 24 hr capsule TAKE 1 CAPSULE BY MOUTH DAILY WITH BREAKFAST. 90 capsule 1  ? venlafaxine XR (EFFEXOR-XR) 37.5 MG 24 hr capsule  TAKE 1 CAPSULE (37.5 MG TOTAL) BY MOUTH DAILY WITH BREAKFAST. 90 capsule 1  ? VITAMIN D PO Take 5,000 Units by mouth daily.    ? ZINC CITRATE PO Take 30 mg by mouth daily.    ? ?No current facility-administered medica

## 2021-10-04 NOTE — Patient Instructions (Addendum)
Anxiety, depression and insomnia.  Overall you describe that you are mood is well and you are sleeping better recently even though you tapered off benzodiazepine.  Recommend currently staying on Effexor current dose and melatonin at night. ? ?You have various areas of pain with positive ANA, elevated titer and recently scoliosis findings in thoracic spine.  Continue to follow-up with sports medicine MD and glad that you have appointment with rheumatologist upcoming.  We discussed your request to possibly switch you to Cymbalta or use combination of Cymbalta with lower dose Effexor.  You are on high dose of Effexor and this might be a challenge.  So I did send a message to your PCP Dr. Charlett Blake. ? ?For joint pains recommend continue to use meloxicam as needed.  Can add on Tylenol as well.  But not use over-the-counter NSAIDs in combination with meloxicam. ? ?Follow-up date to be determined after Dr. Charlett Blake answers my message or sooner if  needed. ? ? ? ? ? ? ? ? ? ? ? ? ? ? ? ? ? ? ? ? ? ? ? ? ? ? ? ? ? ? ? ? ? ? ? ? ? ? ? ? ? ? ? ? ? ? ? ? ? ? ? ? ? ? ? ? ? ? ? ? ? ? ? ? ? ? ? ? ? ? ? ? ? ? ? ? ? ? ? ? ? ? ? ? ? ? ? ? ? ? ? ? ? ? ? ? ? ? ? ? ? ? ? ? ? ? ? ? ? ? ? ? ? ? ? ? ? ? ? ? ? ? ? ? ? ? ? ? ? ? ? ? ? ? ? ? ? ? ? ? ? ? ? ? ? ? ? ? ? ? ? ? ? ? ? ? ? ? ? ? ? ? ? ? ? ? ? ? ? ? ? ? ? ? ? ? ? ? ? ? ? ? ? ? ? ? ? ? ? ? ? ? ? ? ? ? ? ? ? ? ? ? ? ? ? ? ? ? ? ? ? ? ? ? ? ? ? ? ? ? ? ? ? ? ? ? ? ? ? ? ? ? ? ? ? ? ? ? ? ? ? ? ? ? ? ? ? ? ? ? ? ? ? ? ? ? ? ? ? ? ? ? ? ? ? ? ? ? ? ? ? ? ? ? ? ? ? ? ? ? ? ? ? ? ? ? ? ? ? ? ? ? ? ? ? ? ? ? ? ? ? ? ? ? ? ? ? ? ? ? ? ? ? ? ? ? ? ? ? ? ? ? ? ? ? ? ? ? ? ? ? ? ? ? ? ? ? ? ? ? ? ? ? ? ? ? ? ? ? ? ? ? ? ? ? ? ? ? ? ? ? ? ? ? ? ? ? ? ? ? ? ? ? ? ? ? ? ? ? ? ? ? ? ? ? ? ? ? ? ? ? ? ? ? ? ? ? ? ? ? ? ? ? ? ? ? ? ? ? ? ? ? ? ? ? ? ? ? ? ? ? ? ? ? ? ? ? ? ? ? ? ? ? ? ? ? ? ? ? ? ? ? ? ? ? ? ? ? ? ? ? ? ? ? ? ? ? ? ? ? ? ? ? ? ? ? ? ? ? ? ? ? ? ? ? ? ? ? ? ? ? ? ? ? ? ? ? ? ? ? ? ? ? ? ? ? ? ? ? ? ? ? ? ? ? ? ? ? ? ? ? ? ? ? ? ? ? ? ? ? ? ? ? ? ? ? ? ? ? ? ? ? ? ? ? ? ? ? ? ? ? ? ? ? ? ? ? ? ? ? ? ? ? ? ? ? ? ? ? ? ? ? ? ? ? ? ? ? ? ? ? ? ? ? ? ? ? ? ? ? ? ? ? ? ? ? ? ? ? ? ? ? ? ? ? ? ? ? ? ? ? ? ? ? ? ? ? ? ? ? ?

## 2021-10-04 NOTE — Progress Notes (Signed)
?Victoria [name] - 66 y.o. female MRN [id number]  Date of birth: 04-02-1956 ? ?SUBJECTIVE:  Including CC & ROS.  ?No chief complaint on file. ? ? ?Victoria [name] is a 66 y.o. female that is here for shockwave therapy. ? ? ? ?Review of Systems ?See HPI  ? ?HISTORY: Past Medical, Surgical, Social, and Family History Reviewed & Updated per EMR.   ?Pertinent Historical Findings include: ? ?Past Medical History:  ?Diagnosis Date  ? 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 sees Dr Edmund Hilda pulmonology  ? Anemia   ? iron deficiency, resolved  ? Anxiety   ? Asthma   ? CAD (coronary artery disease)   ? a. LHC 6/16:  oOM1 60, pRCA 25 >> med Rx b. cath 3/19 2nd OM with 95% stenosis s/p synergy DES & anomalous RCA  ? CAP (community acquired pneumonia) 2016; 06/07/2016  ? Chronic bronchitis ([place])   ? Chronic lower back pain   ? Complication of anesthesia   ? "think I have a hard time waking up from it"  ? COPD (chronic obstructive pulmonary disease) ([place])   ? Depression   ? mild  ? Diverticulitis   ? Diverticulosis   ? GERD (gastroesophageal reflux disease)   ? H/O hiatal hernia   ? Headache   ? "weekly" (08/23/2017)  ? History of echocardiogram   ? Echo 6/16:  Mod LVH, EF 60-65%, no RWMA, Gr 1 DD, trivial MR, normal LA size.  ? Hyperglycemia 11/20/2015  ? Hyperlipidemia, mixed 09/11/2007  ? Qualifier: Diagnosis of  By: Jerold Coombe   Did not tolerate Lipitor, zocor, Lovastatin, Pravastatin, Livalo, Crestor even low dose   ? IBS (irritable bowel syndrome)   ? Maxillary sinusitis   ? Normal cardiac stress test 11/2011  ? No evidence of ischemia or infarct.   Calculated ejection fraction 72%.  ? Obesity   ? OSA (obstructive sleep apnea) 02/2012  ? has stopped using  cpap  ? Osteoarthritis   ? Osteoporosis   ? Pneumonia 11/2011  ? "before 2013 I hadn't had pneumonia since I was a child" (04/13/2012)  ? Pulmonary nodules   ? S/P angioplasty with stent 08/23/17 ostial 2nd OM with DES synnergy 08/24/2017  ? Schatzki's  ring   ? ? ?Past Surgical History:  ?Procedure Laterality Date  ? APPENDECTOMY  1989  ? CARDIAC CATHETERIZATION N/A 11/25/2014  ? Procedure: Right/Left Heart Cath and Coronary Angiography;  Surgeon: Belva Crome, MD;  Location: [facility];  Service: Cardiovascular;  Laterality: N/A;  ? [place]  ? CORONARY ANGIOPLASTY WITH STENT PLACEMENT  08/23/2017  ? CORONARY STENT INTERVENTION N/A 08/23/2017  ? Procedure: CORONARY STENT INTERVENTION;  Surgeon: Burnell Blanks, MD;  Location: [facility];  Service: Cardiovascular;  Laterality: N/A;  ? HERNIA REPAIR  04/13/2012  ? [place] laparoscopic  ? LEFT HEART CATH AND CORONARY ANGIOGRAPHY N/A 08/23/2017  ? Procedure: LEFT HEART CATH AND CORONARY ANGIOGRAPHY;  Surgeon: Burnell Blanks, MD;  Location: [facility];  Service: Cardiovascular;  Laterality: N/A;  ? VENTRAL HERNIA REPAIR  04/13/2012  ? Procedure: LAPAROSCOPIC VENTRAL HERNIA;  Surgeon: Adin Hector, MD;  Location: [facility];  Service: General;  Laterality: N/A;  laparoscopic repair of incarcerated hernia  ? ? ? ?PHYSICAL EXAM:  ?VS: Ht 5' (1.524 m)   Wt 153 lb (69.4 kg)   BMI 29.88 kg/m?  ?Physical Exam ?Gen: NAD, alert, cooperative with exam, well-appearing ?MSK:  ?  Neurovascularly intact   ? ?ECSWT Note ?[name] ?16-Sep-1955 ? ?Procedure: ECSWT ?Indications: back pain  ? ?Procedure Details ?Consent: Risks of procedure as well as the alternatives and risks of each were explained to the (patient/caregiver).  Consent for procedure obtained. ?Time Out: Verified patient identification, verified procedure, site/side was marked, verified correct patient position, special equipment/implants available, medications/allergies/relevent history reviewed, required imaging and test results available.  Performed.  The area was cleaned with iodine and alcohol swabs.   ? ?The lower back was targeted for Extracorporeal shockwave therapy.  ? ?Preset: Myofascial pain ?Power Level:  60 ?Frequency: 10 ?Impulse/cycles: 2600 ?Head size: Large ?Session: 1st ? ?Patient did tolerate procedure well. ? ? ? ?ASSESSMENT & PLAN:  ? ?Idiopathic scoliosis and kyphoscoliosis ?Completed shockwave therapy.  ? ? ? ? ?

## 2021-10-05 ENCOUNTER — Ambulatory Visit (INDEPENDENT_AMBULATORY_CARE_PROVIDER_SITE_OTHER): Payer: PPO | Admitting: Cardiology

## 2021-10-05 ENCOUNTER — Encounter: Payer: Self-pay | Admitting: Medical

## 2021-10-05 ENCOUNTER — Encounter: Payer: Self-pay | Admitting: Cardiology

## 2021-10-05 VITALS — BP 122/76 | HR 93 | Ht 60.0 in | Wt 151.6 lb

## 2021-10-05 DIAGNOSIS — J4550 Severe persistent asthma, uncomplicated: Secondary | ICD-10-CM

## 2021-10-05 DIAGNOSIS — G720 Drug-induced myopathy: Secondary | ICD-10-CM

## 2021-10-05 DIAGNOSIS — T466X5A Adverse effect of antihyperlipidemic and antiarteriosclerotic drugs, initial encounter: Secondary | ICD-10-CM

## 2021-10-05 DIAGNOSIS — T466X5D Adverse effect of antihyperlipidemic and antiarteriosclerotic drugs, subsequent encounter: Secondary | ICD-10-CM | POA: Diagnosis not present

## 2021-10-05 DIAGNOSIS — I2510 Atherosclerotic heart disease of native coronary artery without angina pectoris: Secondary | ICD-10-CM | POA: Diagnosis not present

## 2021-10-05 DIAGNOSIS — I2583 Coronary atherosclerosis due to lipid rich plaque: Secondary | ICD-10-CM | POA: Diagnosis not present

## 2021-10-05 DIAGNOSIS — D509 Iron deficiency anemia, unspecified: Secondary | ICD-10-CM | POA: Diagnosis not present

## 2021-10-05 DIAGNOSIS — G4733 Obstructive sleep apnea (adult) (pediatric): Secondary | ICD-10-CM

## 2021-10-05 MED ORDER — DULOXETINE HCL 60 MG PO CPEP: 60.0000 mg | ORAL_CAPSULE | Freq: Every day | ORAL | 0 refills | Status: DC

## 2021-10-05 NOTE — Progress Notes (Signed)
?Cardiology Office Note:   ? ?Date:  10/05/2021  ? ?ID:  Victoria [name], DOB Oct 05, 1955, MRN [id number] ? ?PCP:  Mosie Lukes, MD ?  ?[facility] Providers ?Cardiologist:  Ena Dawley, MD {  ? ?Referring MD: Mosie Lukes, MD  ? ? ?History of Present Illness:   ? ?Victoria [name] is a 66 y.o. female with a hx of CAD s/p DES to OM on 08/2017, HLD on PCSK9i, anxiety and OSA on CPAP who was previously followed by Dr. Meda Coffee who now returns to clinic for follow-up. ? ?Per review of the record, the patient has a long history of chest pain. Myoview 02/2019 low risk. Chest CT 04/02/20 showed chronic scarring in right lower lobe nodule and large hiatal hernia which was felt to be causing some of her symptoms. ? ?Last seen in clinic on 10/2020 where she was doing well from a CV standpoint. ? ?Today, she is doing well. She has lost weight and currently is at 151 lbs. She has been following the Keto diet. She had one episode of high blood pressure which she associates to not taking Ativan. She denies chest pain, chest pressure, dyspnea at rest or with exertion, palpitations, PND, orthopnea, or leg swelling. ? ?She endorses back pain due to scoliosis and osteoporosis. The pain radiates down her L hip and L pain which prevents her from exercising regularly. She is able to walk 1/2 a mile before stopping.  ? ?Past Medical History:  ?Diagnosis Date  ? 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 sees Dr Edmund Hilda pulmonology  ? Anemia   ? iron deficiency, resolved  ? Anxiety   ? Asthma   ? CAD (coronary artery disease)   ? a. LHC 6/16:  oOM1 60, pRCA 25 >> med Rx b. cath 3/19 2nd OM with 95% stenosis s/p synergy DES & anomalous RCA  ? CAP (community acquired pneumonia) 2016; 06/07/2016  ? Chronic bronchitis ([place])   ? Chronic lower back pain   ? Complication of anesthesia   ? "think I have a hard time waking up from it"  ? COPD (chronic obstructive pulmonary disease) ([place])   ? Depression   ? mild  ?  Diverticulitis   ? Diverticulosis   ? GERD (gastroesophageal reflux disease)   ? H/O hiatal hernia   ? Headache   ? "weekly" (08/23/2017)  ? History of echocardiogram   ? Echo 6/16:  Mod LVH, EF 60-65%, no RWMA, Gr 1 DD, trivial MR, normal LA size.  ? Hyperglycemia 11/20/2015  ? Hyperlipidemia, mixed 09/11/2007  ? Qualifier: Diagnosis of  By: Jerold Coombe   Did not tolerate Lipitor, zocor, Lovastatin, Pravastatin, Livalo, Crestor even low dose   ? IBS (irritable bowel syndrome)   ? Maxillary sinusitis   ? Normal cardiac stress test 11/2011  ? No evidence of ischemia or infarct.   Calculated ejection fraction 72%.  ? Obesity   ? OSA (obstructive sleep apnea) 02/2012  ? has stopped using  cpap  ? Osteoarthritis   ? Osteoporosis   ? Pneumonia 11/2011  ? "before 2013 I hadn't had pneumonia since I was a child" (04/13/2012)  ? Pulmonary nodules   ? S/P angioplasty with stent 08/23/17 ostial 2nd OM with DES synnergy 08/24/2017  ? Schatzki's ring   ? ? ?Past Surgical History:  ?Procedure Laterality Date  ? APPENDECTOMY  1989  ? CARDIAC CATHETERIZATION N/A 11/25/2014  ? Procedure: Right/Left Heart Cath and Coronary Angiography;  Surgeon: Mallie Mussel  Nicholes Stairs, MD;  Location: [facility];  Service: Cardiovascular;  Laterality: N/A;  ? [place]  ? CORONARY ANGIOPLASTY WITH STENT PLACEMENT  08/23/2017  ? CORONARY STENT INTERVENTION N/A 08/23/2017  ? Procedure: CORONARY STENT INTERVENTION;  Surgeon: Burnell Blanks, MD;  Location: [facility];  Service: Cardiovascular;  Laterality: N/A;  ? HERNIA REPAIR  04/13/2012  ? [place] laparoscopic  ? LEFT HEART CATH AND CORONARY ANGIOGRAPHY N/A 08/23/2017  ? Procedure: LEFT HEART CATH AND CORONARY ANGIOGRAPHY;  Surgeon: Burnell Blanks, MD;  Location: [facility];  Service: Cardiovascular;  Laterality: N/A;  ? VENTRAL HERNIA REPAIR  04/13/2012  ? Procedure: LAPAROSCOPIC VENTRAL HERNIA;  Surgeon: Adin Hector, MD;  Location: [facility];  Service: General;   Laterality: N/A;  laparoscopic repair of incarcerated hernia  ? ? ?Current Medications: ?Current Meds  ?Medication Sig  ? acyclovir ointment (ZOVIRAX) 5 % Apply 1 application topically every 3 (three) hours.  ? albuterol (VENTOLIN HFA) 108 (90 Base) MCG/ACT inhaler Inhale 1-2 puffs into the lungs every 6 (six) hours as needed.  ? aspirin 81 MG tablet Take 81 mg by mouth daily.  ? Benralizumab (FASENRA PEN) 30 MG/ML SOAJ Inject 1 mL (30 mg total) into the skin every 8 (eight) weeks.  ? Bioflavonoid Products (ESTER C PO) Take 500 mg by mouth daily.  ? Calcium-Magnesium-Vitamin D (CALCIUM MAGNESIUM PO) Take 1 tablet by mouth daily.  ? denosumab (PROLIA) 60 MG/ML SOSY injection Inject 60 mg into the skin every 6 (six) months.  ? DULoxetine (CYMBALTA) 60 MG capsule Take 1 capsule (60 mg total) by mouth daily.  ? Evolocumab (REPATHA SURECLICK) 784 MG/ML SOAJ INJECT 1 PEN INTO THE SKIN EVERY 14 DAYS  ? famotidine (PEPCID) 20 MG tablet TAKE 1 TABLET (20 MG TOTAL) BY MOUTH AT BEDTIME.  ? HYDROcodone-acetaminophen (NORCO/VICODIN) 5-325 MG tablet Take 1 tablet by mouth every 8 (eight) hours as needed.  ? ipratropium-albuterol (DUONEB) 0.5-2.5 (3) MG/3ML SOLN USE 1 VIAL BY NEBULIZATION EVERY 6 (SIX) HOURS AS NEEDED.  ? LORazepam (ATIVAN) 1 MG tablet TAKE 1/2 TO 1 TABLET BY MOUTH 2 TIMES A DAY AS NEEDED FOR ANXIETY OR SLEEP  ? meloxicam (MOBIC) 7.5 MG tablet TAKE 1 TO 2 TABLETS BY MOUTH DAILY  ? nitroGLYCERIN (NITROSTAT) 0.4 MG SL tablet PLACE 1 TABLET UNDER THE TONGUE EVERY 5 MINUTES AS NEEDED FOR CHEST PAIN.  ? VITAMIN D PO Take 5,000 Units by mouth daily.  ? ZINC CITRATE PO Take 30 mg by mouth daily.  ?  ? ?Allergies:   Beclomethasone dipropionate, Flexeril [cyclobenzaprine], Mometasone furo-formoterol fum, Sulfonamide derivatives, and Statins  ? ?Social History  ? ?Socioeconomic History  ? Marital status: Married  ?  Spouse name: Not on file  ? Number of children: 1  ? Years of education: 42  ? Highest education level:  Associate degree: academic program  ?Occupational History  ? Occupation: Disabled   ?Tobacco Use  ? Smoking status: Never  ? Smokeless tobacco: Never  ?Vaping Use  ? Vaping Use: Never used  ?Substance and Sexual Activity  ? Alcohol use: Yes  ?  Comment: social use  ? Drug use: No  ? Sexual activity: Yes  ?  Comment: gluten free, lives with husband and son with CP quadriplegia  ?Other Topics Concern  ? Not on file  ?Social History Narrative  ? 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   ? Lives at home with her husband and  son.  ? Right-handed.  ? 2 cups caffeine per day.  ? One story home  ? ?Social Determinants of Health  ? ?Financial Resource Strain: Not on file  ?Food Insecurity: Not on file  ?Transportation Needs: Not on file  ?Physical Activity: Not on file  ?Stress: Not on file  ?Social Connections: Not on file  ?  ? ?Family History: ?The patient's family history includes Breast cancer in her mother and sister; Cancer in her mother and sister; Cancer (age of onset: 26) in her niece; Diabetes in her mother; Diverticulosis in her father; Heart attack in her maternal grandfather; Hypertension in her mother; Osteoporosis in her niece; Prostate cancer in her father; Pulmonary embolism in her brother. There is no history of Stroke, Colon cancer, Esophageal cancer, Stomach cancer, or Rectal cancer. ? ?ROS:   ?Please see the history of present illness.    ?Review of Systems  ?Constitutional:  Negative for chills and fever.  ?HENT:  Negative for sore throat.   ?Eyes:  Negative for redness.  ?Respiratory:  Negative for cough.   ?Cardiovascular:  Negative for chest pain, palpitations, orthopnea, claudication, leg swelling and PND.  ?Gastrointestinal:  Negative for blood in stool, heartburn and melena.  ?Genitourinary:  Negative for hematuria.  ?Musculoskeletal:  Positive for back pain and joint pain (L hip and L knee).  ?Neurological:  Negative for dizziness and loss of consciousness.  ?Endo/Heme/Allergies:  Does not  bruise/bleed easily.  ?Psychiatric/Behavioral:  Negative for substance abuse.   ? ?EKGs/Labs/Other Studies Reviewed:   ? ?The following studies were reviewed today: ?CTA Chest 04/16/21 ?IMPRESSION: ?1. No aneurysm or acute findin

## 2021-10-05 NOTE — Addendum Note (Signed)
Addended by: Anabel Halon on: 10/05/2021 12:21 PM ? ? Modules accepted: Orders ? ?

## 2021-10-05 NOTE — Patient Instructions (Signed)
Medication Instructions:  ? ?Your physician recommends that you continue on your current medications as directed. Please refer to the Current Medication list given to you today. ? ?*If you need a refill on your cardiac medications before your next appointment, please call your pharmacy* ? ? ?Follow-Up: ?At [facility], you and your health needs are our priority.  As part of our continuing mission to provide you with exceptional heart care, we have created designated Provider Care Teams.  These Care Teams include your primary Cardiologist (physician) and Advanced Practice Providers (APPs -  Physician Assistants and Nurse Practitioners) who all work together to provide you with the care you need, when you need it. ? ?We recommend signing up for the patient portal called "MyChart".  Sign up information is provided on this After Visit Summary.  MyChart is used to connect with patients for Virtual Visits (Telemedicine).  Patients are able to view lab/test results, encounter notes, upcoming appointments, etc.  Non-urgent messages can be sent to your provider as well.   ?To learn more about what you can do with MyChart, go to [email or web address].   ? ?Your next appointment:   ?6 month(s) ? ?The format for your next appointment:   ?In Person ? ?Provider:   ?Dr. Pemberton  ? ?Important Information About Sugar ? ? ? ? ? ? ?

## 2021-10-07 ENCOUNTER — Encounter: Payer: Self-pay | Admitting: Family Medicine

## 2021-10-08 ENCOUNTER — Other Ambulatory Visit: Payer: Self-pay | Admitting: Family Medicine

## 2021-10-08 ENCOUNTER — Encounter: Payer: Self-pay | Admitting: Family Medicine

## 2021-10-08 DIAGNOSIS — M79652 Pain in left thigh: Secondary | ICD-10-CM | POA: Diagnosis not present

## 2021-10-08 DIAGNOSIS — Z09 Encounter for follow-up examination after completed treatment for conditions other than malignant neoplasm: Secondary | ICD-10-CM | POA: Diagnosis not present

## 2021-10-08 DIAGNOSIS — M25552 Pain in left hip: Secondary | ICD-10-CM | POA: Diagnosis not present

## 2021-10-08 DIAGNOSIS — M545 Low back pain, unspecified: Secondary | ICD-10-CM | POA: Diagnosis not present

## 2021-10-08 DIAGNOSIS — M4120 Other idiopathic scoliosis, site unspecified: Secondary | ICD-10-CM

## 2021-10-08 DIAGNOSIS — M542 Cervicalgia: Secondary | ICD-10-CM | POA: Diagnosis not present

## 2021-10-08 DIAGNOSIS — Z8719 Personal history of other diseases of the digestive system: Secondary | ICD-10-CM | POA: Diagnosis not present

## 2021-10-08 NOTE — Progress Notes (Signed)
Referral placed to PT  ? ?Rosemarie Ax, MD ?[facility] ?10/08/2021, 1:22 PM ? ?

## 2021-10-11 ENCOUNTER — Ambulatory Visit: Payer: PPO | Admitting: Family Medicine

## 2021-10-11 ENCOUNTER — Other Ambulatory Visit: Payer: Self-pay | Admitting: Family Medicine

## 2021-10-11 MED ORDER — HYDROCODONE-ACETAMINOPHEN 5-325 MG PO TABS: 1.0000 | ORAL_TABLET | Freq: Three times a day (TID) | ORAL | 0 refills | Status: DC | PRN

## 2021-10-11 NOTE — Progress Notes (Signed)
Provided norco for food.  ? ?Rosemarie Ax, MD ?[facility] ?10/11/2021, 8:08 AM ? ?

## 2021-10-11 NOTE — Telephone Encounter (Signed)
Prolia due after 11/09/21.  ?

## 2021-10-12 ENCOUNTER — Ambulatory Visit: Payer: PPO | Admitting: Family Medicine

## 2021-10-14 DIAGNOSIS — M4124 Other idiopathic scoliosis, thoracic region: Secondary | ICD-10-CM | POA: Diagnosis not present

## 2021-10-14 DIAGNOSIS — M79652 Pain in left thigh: Secondary | ICD-10-CM | POA: Diagnosis not present

## 2021-10-14 DIAGNOSIS — M4004 Postural kyphosis, thoracic region: Secondary | ICD-10-CM | POA: Diagnosis not present

## 2021-10-14 DIAGNOSIS — M9902 Segmental and somatic dysfunction of thoracic region: Secondary | ICD-10-CM | POA: Diagnosis not present

## 2021-10-14 DIAGNOSIS — M9901 Segmental and somatic dysfunction of cervical region: Secondary | ICD-10-CM | POA: Diagnosis not present

## 2021-10-14 DIAGNOSIS — M545 Low back pain, unspecified: Secondary | ICD-10-CM | POA: Diagnosis not present

## 2021-10-14 DIAGNOSIS — M542 Cervicalgia: Secondary | ICD-10-CM | POA: Diagnosis not present

## 2021-10-14 DIAGNOSIS — M25552 Pain in left hip: Secondary | ICD-10-CM | POA: Diagnosis not present

## 2021-10-15 ENCOUNTER — Other Ambulatory Visit: Payer: Self-pay | Admitting: Family Medicine

## 2021-10-18 ENCOUNTER — Telehealth: Payer: Self-pay | Admitting: Family Medicine

## 2021-10-18 NOTE — Progress Notes (Signed)
? ?Subjective:  ? Victoria [name] is a 66 y.o. female who presents for an Initial Medicare Annual Wellness Visit. ?I connected with  [name] on 10/19/21 by a audio enabled telemedicine application and verified that I am speaking with the correct person using two identifiers. ? ?Patient Location: Home ? ?Provider Location: Office/Clinic ? ?I discussed the limitations of evaluation and management by telemedicine. The patient expressed understanding and agreed to proceed.  ? ?Review of Systems    ? ?Cardiac Risk Factors include: advanced age (>59mn, >>60women);dyslipidemia ? ?   ?Objective:  ?  ?Today's Vitals  ? 10/19/21 0903  ?PainSc: 6   ? ?There is no height or weight on file to calculate BMI. ? ? ?  10/19/2021  ?  9:03 AM 08/15/2021  ? 11:01 AM 10/02/2020  ?  8:20 AM 09/21/2020  ? 11:54 AM 12/11/2019  ? 10:59 AM 08/23/2017  ?  7:11 PM 08/23/2017  ? 12:31 PM  ?Advanced Directives  ?Does Patient Have a Medical Advance Directive? Yes No Yes Yes Yes Yes Yes  ?Type of [place] DNR (pink MOST or yellow form);Living will  Living will;Healthcare Power of [place]Living will [place] Lake ParkLiving will  ?Does patient want to make changes to medical advance directive?      No - Patient declined No - Patient declined  ?Copy of [place] Chart? Yes - validated most recent copy scanned in chart (See row information)  Yes - validated most recent copy scanned in chart (See row information)   Yes Yes  ?Would patient like information on creating a medical advance directive?    No - Patient declined     ? ? ?Current Medications (verified) ?Outpatient Encounter Medications as of 10/19/2021  ?Medication Sig  ? acyclovir ointment (ZOVIRAX) 5 % Apply 1 application topically every 3 (three) hours.  ? albuterol (VENTOLIN HFA) 108 (90 Base) MCG/ACT inhaler Inhale 1-2 puffs into the lungs every 6 (six) hours as needed.  ?  aspirin 81 MG tablet Take 81 mg by mouth daily.  ? Benralizumab (FASENRA PEN) 30 MG/ML SOAJ Inject 1 mL (30 mg total) into the skin every 8 (eight) weeks.  ? Bioflavonoid Products (ESTER C PO) Take 500 mg by mouth daily.  ? Calcium-Magnesium-Vitamin D (CALCIUM MAGNESIUM PO) Take 1 tablet by mouth daily.  ? denosumab (PROLIA) 60 MG/ML SOSY injection Inject 60 mg into the skin every 6 (six) months.  ? DULoxetine (CYMBALTA) 60 MG capsule Take 1 capsule (60 mg total) by mouth daily.  ? Evolocumab (REPATHA SURECLICK) 1366MG/ML SOAJ INJECT 1 PEN INTO THE SKIN EVERY 14 DAYS  ? famotidine (PEPCID) 20 MG tablet TAKE 1 TABLET (20 MG TOTAL) BY MOUTH AT BEDTIME.  ? HYDROcodone-acetaminophen (NORCO/VICODIN) 5-325 MG tablet Take 1 tablet by mouth every 8 (eight) hours as needed.  ? ipratropium-albuterol (DUONEB) 0.5-2.5 (3) MG/3ML SOLN USE 1 VIAL BY NEBULIZATION EVERY 6 (SIX) HOURS AS NEEDED.  ? LORazepam (ATIVAN) 1 MG tablet TAKE 1/2 TO 1 TABLET BY MOUTH 2 TIMES A DAY AS NEEDED FOR ANXIETY OR SLEEP  ? meloxicam (MOBIC) 7.5 MG tablet TAKE 1 TO 2 TABLETS BY MOUTH DAILY  ? nitroGLYCERIN (NITROSTAT) 0.4 MG SL tablet PLACE 1 TABLET UNDER THE TONGUE EVERY 5 MINUTES AS NEEDED FOR CHEST PAIN.  ? VITAMIN D PO Take 5,000 Units by mouth daily.  ? ZINC CITRATE PO Take 30  mg by mouth daily.  ? ?No facility-administered encounter medications on file as of 10/19/2021.  ? ? ?Allergies (verified) ?Beclomethasone dipropionate, Flexeril [cyclobenzaprine], Mometasone furo-formoterol fum, Sulfonamide derivatives, and Statins  ? ?History: ?Past Medical History:  ?Diagnosis Date  ? 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 sees Dr Edmund Hilda pulmonology  ? Anemia   ? iron deficiency, resolved  ? Anxiety   ? Asthma   ? CAD (coronary artery disease)   ? a. LHC 6/16:  oOM1 60, pRCA 25 >> med Rx b. cath 3/19 2nd OM with 95% stenosis s/p synergy DES & anomalous RCA  ? CAP (community acquired pneumonia) 2016; 06/07/2016  ? Chronic  bronchitis ([place])   ? Chronic lower back pain   ? Complication of anesthesia   ? "think I have a hard time waking up from it"  ? COPD (chronic obstructive pulmonary disease) ([place])   ? Depression   ? mild  ? Diverticulitis   ? Diverticulosis   ? GERD (gastroesophageal reflux disease)   ? H/O hiatal hernia   ? Headache   ? "weekly" (08/23/2017)  ? History of echocardiogram   ? Echo 6/16:  Mod LVH, EF 60-65%, no RWMA, Gr 1 DD, trivial MR, normal LA size.  ? Hyperglycemia 11/20/2015  ? Hyperlipidemia, mixed 09/11/2007  ? Qualifier: Diagnosis of  By: Jerold Coombe   Did not tolerate Lipitor, zocor, Lovastatin, Pravastatin, Livalo, Crestor even low dose   ? IBS (irritable bowel syndrome)   ? Maxillary sinusitis   ? Normal cardiac stress test 11/2011  ? No evidence of ischemia or infarct.   Calculated ejection fraction 72%.  ? Obesity   ? OSA (obstructive sleep apnea) 02/2012  ? has stopped using  cpap  ? Osteoarthritis   ? Osteoporosis   ? Pneumonia 11/2011  ? "before 2013 I hadn't had pneumonia since I was a child" (04/13/2012)  ? Pulmonary nodules   ? S/P angioplasty with stent 08/23/17 ostial 2nd OM with DES synnergy 08/24/2017  ? Schatzki's ring   ? ?Past Surgical History:  ?Procedure Laterality Date  ? APPENDECTOMY  1989  ? CARDIAC CATHETERIZATION N/A 11/25/2014  ? Procedure: Right/Left Heart Cath and Coronary Angiography;  Surgeon: Belva Crome, MD;  Location: [facility] [place] [facility];  Service: Cardiovascular;  Laterality: N/A;  ? [place]  ? CORONARY ANGIOPLASTY WITH STENT PLACEMENT  08/23/2017  ? CORONARY STENT INTERVENTION N/A 08/23/2017  ? Procedure: CORONARY STENT INTERVENTION;  Surgeon: Burnell Blanks, MD;  Location: [place] [facility];  Service: Cardiovascular;  Laterality: N/A;  ? HERNIA REPAIR  04/13/2012  ? [place] laparoscopic  ? LEFT HEART CATH AND CORONARY ANGIOGRAPHY N/A 08/23/2017  ? Procedure: LEFT HEART CATH AND CORONARY ANGIOGRAPHY;  Surgeon: Burnell Blanks, MD;  Location: [facility];  Service: Cardiovascular;  Laterality: N/A;  ? VENTRAL HERNIA REPAIR  04/13/2012  ? Procedure: LAPAROSCOPIC VENTRAL HERNIA;  Surgeon: Adin Hector, MD;  Location: [facility];  Service: General;  Laterality: N/A;  laparoscopic repair of incarcerated hernia  ? ?Family History  ?Problem Relation Age of Onset  ? Breast cancer Mother   ? Hypertension Mother   ? Diabetes Mother   ? Cancer Mother   ?     recurrent, metastatic breast cancer.   ? Diverticulosis Father   ? Prostate cancer Father   ? Breast cancer Sister   ? Cancer Sister   ?     breast cancer, invasive ductal  carcinoma in 2022,DCIS at 52 with 4 weeks of radiation, 5 years of Tamoxifen   ? Pulmonary embolism Brother   ?     recurrent  ? Heart attack Maternal Grandfather   ? Cancer Niece 81  ? Osteoporosis Niece   ? Stroke Neg Hx   ? Colon cancer Neg Hx   ? Esophageal cancer Neg Hx   ? Stomach cancer Neg Hx   ? Rectal cancer Neg Hx   ? ?Social History  ? ?Socioeconomic History  ? Marital status: Married  ?  Spouse name: Not on file  ? Number of children: 1  ? Years of education: 42  ? Highest education level: Associate degree: academic program  ?Occupational History  ? Occupation: Disabled   ?Tobacco Use  ? Smoking status: Never  ? Smokeless tobacco: Never  ?Vaping Use  ? Vaping Use: Never used  ?Substance and Sexual Activity  ? Alcohol use: Yes  ?  Comment: social use  ? Drug use: No  ? Sexual activity: Yes  ?  Comment: gluten free, lives with husband and son with CP quadriplegia  ?Other Topics Concern  ? Not on file  ?Social History Narrative  ? 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   ? Lives at home with her husband and son.  ? Right-handed.  ? 2 cups caffeine per day.  ? One story home  ? ?Social Determinants of Health  ? ?Financial Resource Strain: Low Risk   ? Difficulty of Paying Living Expenses: Not hard at all  ?Food Insecurity: No Food Insecurity  ? Worried About Charity fundraiser in the Last Year: Never true  ? Ran Out of Food in the Last Year:  Never true  ?Transportation Needs: No Transportation Needs  ? Lack of Transportation (Medical): No  ? Lack of Transportation (Non-Medical): No  ?Physical Activity: Insufficiently Active  ? Days of Exercise per Week:

## 2021-10-18 NOTE — Telephone Encounter (Signed)
Pt called to schedule Prolia Shot visit. After reviewing her chart, no OOP cost was visible to advise pt with. Nurse Visit was scheduled for 5.30.23. Need OOP run to advise patient of cost. Please Advise. ?

## 2021-10-19 ENCOUNTER — Other Ambulatory Visit: Payer: Self-pay | Admitting: Family Medicine

## 2021-10-19 ENCOUNTER — Ambulatory Visit (INDEPENDENT_AMBULATORY_CARE_PROVIDER_SITE_OTHER): Payer: PPO

## 2021-10-19 DIAGNOSIS — M5416 Radiculopathy, lumbar region: Secondary | ICD-10-CM

## 2021-10-19 DIAGNOSIS — Z0000 Encounter for general adult medical examination without abnormal findings: Secondary | ICD-10-CM

## 2021-10-19 DIAGNOSIS — M79652 Pain in left thigh: Secondary | ICD-10-CM | POA: Diagnosis not present

## 2021-10-19 DIAGNOSIS — M25552 Pain in left hip: Secondary | ICD-10-CM | POA: Diagnosis not present

## 2021-10-19 DIAGNOSIS — M542 Cervicalgia: Secondary | ICD-10-CM | POA: Diagnosis not present

## 2021-10-19 DIAGNOSIS — M545 Low back pain, unspecified: Secondary | ICD-10-CM | POA: Diagnosis not present

## 2021-10-19 NOTE — Telephone Encounter (Signed)
Victoria [name] Yesterday (11:04 AM)  ? ?CE ?Pt called to schedule Prolia Shot visit. After reviewing her chart, no OOP cost was visible to advise pt with. Nurse Visit was scheduled for 5.30.23. Need OOP run to advise patient of cost. Please Advise.  ?  ? ?

## 2021-10-19 NOTE — Patient Instructions (Signed)
Victoria [name] , ?Thank you for taking time to come for your Medicare Wellness Visit. I appreciate your ongoing commitment to your health goals. Please review the following plan we discussed and let me know if I can assist you in the future.  ? ?Screening recommendations/referrals: ?Colonoscopy: 11/19/19 due 11/18/29 ?Mammogram: 02/04/21 due 02/04/22 ?Bone Density: 01/2021 due 01/2023 ?Recommended yearly ophthalmology/optometry visit for glaucoma screening and checkup ?Recommended yearly dental visit for hygiene and checkup ? ?Vaccinations: ?Influenza vaccine: up to date ?Pneumococcal vaccine: up to date ?Tdap vaccine: up to date ?Shingles vaccine: up to date  ?Covid-19: completed ? ?Advanced directives: yes, on file ? ?Conditions/risks identified: see problem list ? ?Next appointment: Follow up in one year for your annual wellness visit.  ? ?Preventive Care 40-64 Years, Female ?Preventive care refers to lifestyle choices and visits with your health care provider that can promote health and wellness. ?What does preventive care include? ?A yearly physical exam. This is also called an annual well check. ?Dental exams once or twice a year. ?Routine eye exams. Ask your health care provider how often you should have your eyes checked. ?Personal lifestyle choices, including: ?Daily care of your teeth and gums. ?Regular physical activity. ?Eating a healthy diet. ?Avoiding tobacco and drug use. ?Limiting alcohol use. ?Practicing safe sex. ?Taking low-dose aspirin daily starting at age 6. ?Taking vitamin and mineral supplements as recommended by your health care provider. ?What happens during an annual well check? ?The services and screenings done by your health care provider during your annual well check will depend on your age, overall health, lifestyle risk factors, and family history of disease. ?Counseling  ?Your health care provider may ask you questions about your: ?Alcohol use. ?Tobacco use. ?Drug use. ?Emotional  well-being. ?Home and relationship well-being. ?Sexual activity. ?Eating habits. ?Work and work Statistician. ?Method of birth control. ?Menstrual cycle. ?Pregnancy history. ?Screening  ?You may have the following tests or measurements: ?Height, weight, and BMI. ?Blood pressure. ?Lipid and cholesterol levels. These may be checked every 5 years, or more frequently if you are over 26 years old. ?Skin check. ?Lung cancer screening. You may have this screening every year starting at age 71 if you have a 30-pack-year history of smoking and currently smoke or have quit within the past 15 years. ?Fecal occult blood test (FOBT) of the stool. You may have this test every year starting at age 64. ?Flexible sigmoidoscopy or colonoscopy. You may have a sigmoidoscopy every 5 years or a colonoscopy every 10 years starting at age 70. ?Hepatitis C blood test. ?Hepatitis B blood test. ?Sexually transmitted disease (STD) testing. ?Diabetes screening. This is done by checking your blood sugar (glucose) after you have not eaten for a while (fasting). You may have this done every 1-3 years. ?Mammogram. This may be done every 1-2 years. Talk to your health care provider about when you should start having regular mammograms. This may depend on whether you have a family history of breast cancer. ?BRCA-related cancer screening. This may be done if you have a family history of breast, ovarian, tubal, or peritoneal cancers. ?Pelvic exam and Pap test. This may be done every 3 years starting at age 39. Starting at age 43, this may be done every 5 years if you have a Pap test in combination with an HPV test. ?Bone density scan. This is done to screen for osteoporosis. You may have this scan if you are at high risk for osteoporosis. ?Discuss your test results, treatment options, and if necessary,  the need for more tests with your health care provider. ?Vaccines  ?Your health care provider may recommend certain vaccines, such as: ?Influenza  vaccine. This is recommended every year. ?Tetanus, diphtheria, and acellular pertussis (Tdap, Td) vaccine. You may need a Td booster every 10 years. ?Zoster vaccine. You may need this after age 36. ?Pneumococcal 13-valent conjugate (PCV13) vaccine. You may need this if you have certain conditions and were not previously vaccinated. ?Pneumococcal polysaccharide (PPSV23) vaccine. You may need one or two doses if you smoke cigarettes or if you have certain conditions. ?Talk to your health care provider about which screenings and vaccines you need and how often you need them. ?This information is not intended to replace advice given to you by your health care provider. Make sure you discuss any questions you have with your health care provider. ?Document Released: 07/03/2015 Document Revised: 02/24/2016 Document Reviewed: 04/07/2015 ?Elsevier Interactive Patient Education ? 2017 Elsevier Inc. ? ? ? ?Fall Prevention in the Home ?Falls can cause injuries. They can happen to people of all ages. There are many things you can do to make your home safe and to help prevent falls. ?What can I do on the outside of my home? ?Regularly fix the edges of walkways and driveways and fix any cracks. ?Remove anything that might make you trip as you walk through a door, such as a raised step or threshold. ?Trim any bushes or trees on the path to your home. ?Use bright outdoor lighting. ?Clear any walking paths of anything that might make someone trip, such as rocks or tools. ?Regularly check to see if handrails are loose or broken. Make sure that both sides of any steps have handrails. ?Any raised decks and porches should have guardrails on the edges. ?Have any leaves, snow, or ice cleared regularly. ?Use sand or salt on walking paths during winter. ?Clean up any spills in your garage right away. This includes oil or grease spills. ?What can I do in the bathroom? ?Use night lights. ?Install grab bars by the toilet and in the tub and  shower. Do not use towel bars as grab bars. ?Use non-skid mats or decals in the tub or shower. ?If you need to sit down in the shower, use a plastic, non-slip stool. ?Keep the floor dry. Clean up any water that spills on the floor as soon as it happens. ?Remove soap buildup in the tub or shower regularly. ?Attach bath mats securely with double-sided non-slip rug tape. ?Do not have throw rugs and other things on the floor that can make you trip. ?What can I do in the bedroom? ?Use night lights. ?Make sure that you have a light by your bed that is easy to reach. ?Do not use any sheets or blankets that are too big for your bed. They should not hang down onto the floor. ?Have a firm chair that has side arms. You can use this for support while you get dressed. ?Do not have throw rugs and other things on the floor that can make you trip. ?What can I do in the kitchen? ?Clean up any spills right away. ?Avoid walking on wet floors. ?Keep items that you use a lot in easy-to-reach places. ?If you need to reach something above you, use a strong step stool that has a grab bar. ?Keep electrical cords out of the way. ?Do not use floor polish or wax that makes floors slippery. If you must use wax, use non-skid floor wax. ?Do not have throw rugs  and other things on the floor that can make you trip. ?What can I do with my stairs? ?Do not leave any items on the stairs. ?Make sure that there are handrails on both sides of the stairs and use them. Fix handrails that are broken or loose. Make sure that handrails are as long as the stairways. ?Check any carpeting to make sure that it is firmly attached to the stairs. Fix any carpet that is loose or worn. ?Avoid having throw rugs at the top or bottom of the stairs. If you do have throw rugs, attach them to the floor with carpet tape. ?Make sure that you have a light switch at the top of the stairs and the bottom of the stairs. If you do not have them, ask someone to add them for  you. ?What else can I do to help prevent falls? ?Wear shoes that: ?Do not have high heels. ?Have rubber bottoms. ?Are comfortable and fit you well. ?Are closed at the toe. Do not wear sandals. ?If you use a stepladder: ?Make sure that

## 2021-10-20 ENCOUNTER — Telehealth (INDEPENDENT_AMBULATORY_CARE_PROVIDER_SITE_OTHER): Payer: PPO | Admitting: Family Medicine

## 2021-10-20 ENCOUNTER — Encounter: Payer: Self-pay | Admitting: Family Medicine

## 2021-10-20 VITALS — BP 126/79 | HR 90

## 2021-10-20 DIAGNOSIS — F32A Depression, unspecified: Secondary | ICD-10-CM | POA: Diagnosis not present

## 2021-10-20 DIAGNOSIS — F419 Anxiety disorder, unspecified: Secondary | ICD-10-CM | POA: Diagnosis not present

## 2021-10-20 NOTE — Progress Notes (Signed)
Virtual Video Visit via MyChart Note ? ?I connected with  Victoria [name] on 10/20/21 at  1:40 PM EDT by the video enabled telemedicine application for MyChart, and verified that I am speaking with the correct person using two identifiers. ?  ?I introduced myself as a Designer, jewellery with the practice. We discussed the limitations of evaluation and management by telemedicine and the availability of in person appointments. The patient expressed understanding and agreed to proceed. ? ?Participating parties in this visit include: The patient and the nurse practitioner listed.  ?The patient is: At home ?I am: In the office - [facility] ? ?Subjective:   ? ?CC: Cymbalta question ? ? ?HPI: Victoria [name] is a 66 y.o. year old female presenting today via [place] today for Cymbalta question. ? ? ?Last month she transitioned from Effexor to Cymbalta for depression/anxiety and chronic pain. Reports that Effexor was losing effectiveness. Initially she was groggy, so switched and took it closer to bedtime, but then noticed that in early afternoons as med was wearing off she was more grouchy. She switched back to taking it in the mornings and has not had any more grogginess. Still has her up/down days, but doing okay overall. She has not any side effects. She is disappointed that she has not yet seen an improvement in her insomnia or chronic pain.  ? ? ? ? ?Past medical history, Surgical history, Family history not pertinant except as noted below, Social history, Allergies, and medications have been entered into the medical record, reviewed, and corrections made.  ? ?Review of Systems:  ?All review of systems negative except what is listed in the HPI ? ? ?Objective:   ? ?General:  ?Speaking clearly in complete sentences. ?Absent shortness of breath noted.   ?Alert and oriented x3.   ?Normal judgment.  ?Absent acute distress. ? ? ?Impression and Recommendations:   ? ?1. Anxiety and  depression ?Mood is stable. No SI/HI. She has not noticed a significant improvement with her chronic pain and insomnia. She has only been taking for 2 weeks now and has not had any significant side effects like she has with several other meds she has tried in the past. She is agreeable to try for another two weeks or so and at that time we can discuss trying an increased dose or stopping altogether.  ?Patient aware of signs/symptoms requiring further/urgent evaluation.  ? ? ?Follow-up if symptoms worsen or fail to improve.  ?  ?I discussed the assessment and treatment plan with the patient. The patient was provided an opportunity to ask questions and all were answered. The patient agreed with the plan and demonstrated an understanding of the instructions. ?  ?The patient was advised to call back or seek an in-person evaluation if the symptoms worsen or if the condition fails to improve as anticipated. ? ?I spent 20 minutes dedicated to the care of this patient on the date of this encounter to include pre-visit chart review of prior notes and results, face-to-face time with the patient, and post-visit ordering of testing as indicated.  ? ?Terrilyn Saver, NP  ? ?

## 2021-10-21 ENCOUNTER — Ambulatory Visit: Payer: PPO | Admitting: Nurse Practitioner

## 2021-10-21 DIAGNOSIS — M542 Cervicalgia: Secondary | ICD-10-CM | POA: Diagnosis not present

## 2021-10-21 DIAGNOSIS — M25552 Pain in left hip: Secondary | ICD-10-CM | POA: Diagnosis not present

## 2021-10-21 DIAGNOSIS — M545 Low back pain, unspecified: Secondary | ICD-10-CM | POA: Diagnosis not present

## 2021-10-21 DIAGNOSIS — M79652 Pain in left thigh: Secondary | ICD-10-CM | POA: Diagnosis not present

## 2021-10-22 ENCOUNTER — Telehealth: Payer: PPO | Admitting: Family Medicine

## 2021-10-26 DIAGNOSIS — M545 Low back pain, unspecified: Secondary | ICD-10-CM | POA: Diagnosis not present

## 2021-10-26 DIAGNOSIS — M25552 Pain in left hip: Secondary | ICD-10-CM | POA: Diagnosis not present

## 2021-10-26 DIAGNOSIS — M79652 Pain in left thigh: Secondary | ICD-10-CM | POA: Diagnosis not present

## 2021-10-26 DIAGNOSIS — M542 Cervicalgia: Secondary | ICD-10-CM | POA: Diagnosis not present

## 2021-10-28 ENCOUNTER — Ambulatory Visit
Admission: RE | Admit: 2021-10-28 | Discharge: 2021-10-28 | Disposition: A | Discharge status: Home / Self Care | Payer: PPO | Source: Ambulatory Visit | Attending: Family Medicine | Admitting: Family Medicine

## 2021-10-28 DIAGNOSIS — M5416 Radiculopathy, lumbar region: Secondary | ICD-10-CM

## 2021-10-28 DIAGNOSIS — M5134 Other intervertebral disc degeneration, thoracic region: Secondary | ICD-10-CM | POA: Diagnosis not present

## 2021-10-28 MED ORDER — METHYLPREDNISOLONE ACETATE 40 MG/ML INJ SUSP (RADIOLOG
80.0000 mg | Freq: Once | INTRAMUSCULAR | Status: AC
Start: 2021-10-28 — End: 2021-10-28
  Administered 2021-10-28: 80 mg via EPIDURAL

## 2021-10-28 MED ORDER — IOPAMIDOL (ISOVUE-M 200) INJECTION 41%
1.0000 mL | Freq: Once | INTRAMUSCULAR | Status: AC
  Administered 2021-10-28: 1 mL via EPIDURAL

## 2021-10-28 NOTE — Discharge Instructions (Signed)

## 2021-11-01 ENCOUNTER — Telehealth: Payer: PPO | Admitting: Family Medicine

## 2021-11-04 ENCOUNTER — Other Ambulatory Visit: Payer: Self-pay | Admitting: Medical

## 2021-11-04 ENCOUNTER — Telehealth: Payer: Self-pay | Admitting: Family Medicine

## 2021-11-04 NOTE — Telephone Encounter (Signed)
Pt ready for scheduling on or after 11/10/21  Out-of-pocket cost due at time of visit: $301  Primary: [employer] Medicare Prolia co-insurance: 20% (approximately $276) Admin fee co-insurance: 20% (approximately $25)  Secondary: n/a Prolia co-insurance:  Admin fee co-insurance:   Deductible: does not apply  Prior Auth: not required PA# Valid:   ** This summary of benefits is an estimation of the patient's out-of-pocket cost. Exact cost may vary based on individual plan coverage.

## 2021-11-04 NOTE — Telephone Encounter (Signed)
Patient states she would like her Cymbalta to be refilled for '90mg'$ s due to her anxiety being worse. She states she has already been taking 90 and she seems to be doing fine on it. Please advise.     [address]  [place], [place] [address]  Phone:  [phone]  Fax:  [phone]

## 2021-11-05 ENCOUNTER — Encounter: Payer: Self-pay | Admitting: Family Medicine

## 2021-11-05 ENCOUNTER — Other Ambulatory Visit: Payer: Self-pay | Admitting: Family Medicine

## 2021-11-05 MED ORDER — DULOXETINE HCL 30 MG PO CPEP: ORAL_CAPSULE | ORAL | 5 refills | Status: DC

## 2021-11-05 NOTE — Telephone Encounter (Signed)
Lvm for pt. Message sent to MyChart to explain cost. (Apt was previously set for 11/16/21).

## 2021-11-05 NOTE — Telephone Encounter (Signed)
New prescription sent in per Dr Charlett Blake.

## 2021-11-08 ENCOUNTER — Other Ambulatory Visit: Payer: Self-pay | Admitting: Family Medicine

## 2021-11-08 DIAGNOSIS — M4124 Other idiopathic scoliosis, thoracic region: Secondary | ICD-10-CM | POA: Diagnosis not present

## 2021-11-08 DIAGNOSIS — M9901 Segmental and somatic dysfunction of cervical region: Secondary | ICD-10-CM | POA: Diagnosis not present

## 2021-11-08 DIAGNOSIS — M4004 Postural kyphosis, thoracic region: Secondary | ICD-10-CM | POA: Diagnosis not present

## 2021-11-08 DIAGNOSIS — M9902 Segmental and somatic dysfunction of thoracic region: Secondary | ICD-10-CM | POA: Diagnosis not present

## 2021-11-08 MED ORDER — HYDROCODONE-ACETAMINOPHEN 5-325 MG PO TABS: 1.0000 | ORAL_TABLET | Freq: Three times a day (TID) | ORAL | 0 refills | Status: DC | PRN

## 2021-11-08 NOTE — Progress Notes (Signed)
Refilled norco.   Rosemarie Ax, MD [facility] 11/08/2021, 8:36 AM

## 2021-11-09 DIAGNOSIS — M545 Low back pain, unspecified: Secondary | ICD-10-CM | POA: Diagnosis not present

## 2021-11-09 DIAGNOSIS — M542 Cervicalgia: Secondary | ICD-10-CM | POA: Diagnosis not present

## 2021-11-09 DIAGNOSIS — M25552 Pain in left hip: Secondary | ICD-10-CM | POA: Diagnosis not present

## 2021-11-09 DIAGNOSIS — M79652 Pain in left thigh: Secondary | ICD-10-CM | POA: Diagnosis not present

## 2021-11-11 DIAGNOSIS — M9902 Segmental and somatic dysfunction of thoracic region: Secondary | ICD-10-CM | POA: Diagnosis not present

## 2021-11-11 DIAGNOSIS — M4124 Other idiopathic scoliosis, thoracic region: Secondary | ICD-10-CM | POA: Diagnosis not present

## 2021-11-11 DIAGNOSIS — M4004 Postural kyphosis, thoracic region: Secondary | ICD-10-CM | POA: Diagnosis not present

## 2021-11-11 DIAGNOSIS — M9901 Segmental and somatic dysfunction of cervical region: Secondary | ICD-10-CM | POA: Diagnosis not present

## 2021-11-16 ENCOUNTER — Ambulatory Visit: Payer: PPO

## 2021-11-16 ENCOUNTER — Ambulatory Visit (INDEPENDENT_AMBULATORY_CARE_PROVIDER_SITE_OTHER): Payer: PPO | Admitting: Family

## 2021-11-16 VITALS — BP 119/70 | HR 73 | Temp 98.7°F | Resp 16 | Wt 151.0 lb

## 2021-11-16 DIAGNOSIS — M4004 Postural kyphosis, thoracic region: Secondary | ICD-10-CM | POA: Diagnosis not present

## 2021-11-16 DIAGNOSIS — R197 Diarrhea, unspecified: Secondary | ICD-10-CM | POA: Diagnosis not present

## 2021-11-16 DIAGNOSIS — M545 Low back pain, unspecified: Secondary | ICD-10-CM | POA: Diagnosis not present

## 2021-11-16 DIAGNOSIS — M4124 Other idiopathic scoliosis, thoracic region: Secondary | ICD-10-CM | POA: Diagnosis not present

## 2021-11-16 DIAGNOSIS — M9901 Segmental and somatic dysfunction of cervical region: Secondary | ICD-10-CM | POA: Diagnosis not present

## 2021-11-16 DIAGNOSIS — M810 Age-related osteoporosis without current pathological fracture: Secondary | ICD-10-CM

## 2021-11-16 DIAGNOSIS — M79652 Pain in left thigh: Secondary | ICD-10-CM | POA: Diagnosis not present

## 2021-11-16 DIAGNOSIS — M542 Cervicalgia: Secondary | ICD-10-CM | POA: Diagnosis not present

## 2021-11-16 DIAGNOSIS — M9902 Segmental and somatic dysfunction of thoracic region: Secondary | ICD-10-CM | POA: Diagnosis not present

## 2021-11-16 DIAGNOSIS — M25552 Pain in left hip: Secondary | ICD-10-CM | POA: Diagnosis not present

## 2021-11-16 MED ORDER — DENOSUMAB 60 MG/ML ~~LOC~~ SOSY
60.0000 mg | PREFILLED_SYRINGE | Freq: Once | SUBCUTANEOUS | 0 refills | Status: AC
Start: 2021-11-16 — End: 2021-11-16

## 2021-11-16 NOTE — Assessment & Plan Note (Signed)
Reports prolia is too expensive at our office but she can get for $100 if she brings the dose into our office for administration. Rx sent to her pharmacy.

## 2021-11-16 NOTE — Progress Notes (Signed)
Subjective:   By signing my name below, I, Carylon Perches, attest that this documentation has been prepared under the direction and in the presence of Karie Chimera, NP 11/16/2021   Patient ID: Victoria [name], female    DOB: 15-Apr-1956, 66 y.o.   MRN: [id number]  Chief Complaint  Patient presents with   Diarrhea    Complains of diarrhea on and off since April.    HPI Patient is in today for an office visit.  Diarrhea - She complains of intermittent diarrhea since 09/2021. She reports that on the evening of 11/13/2021, she was back and forth to the restroom about 5 times. She reports that her feces is in pieces. She has mild tenderness in her abdomen and reports her abdomen is noisy. She had an appointment scheduled with a gastrologist for 12/06/2021. She does not believe the symptoms are due to the Cymbalta medication. She denies of any melena, bloody stools or nausea.  Prolia - She wants to send a prescription of Prolia to her pharmacy.   There are no preventive care reminders to display for this patient.  Past Medical History:  Diagnosis Date   Allergic bronchopulmonary aspergillosis ([place]) 2008   sees Dr Edmund Hilda pulmonology   Anemia    iron deficiency, resolved   Anxiety    Asthma    CAD (coronary artery disease)    a. LHC 6/16:  oOM1 60, pRCA 25 >> med Rx b. cath 3/19 2nd OM with 95% stenosis s/p synergy DES & anomalous RCA   CAP (community acquired pneumonia) 2016; 06/07/2016   Chronic bronchitis (HCC)    Chronic lower back pain    Complication of anesthesia    "think I have a hard time waking up from it"   COPD (chronic obstructive pulmonary disease) ([place])    Depression    mild   Diverticulitis    Diverticulosis    GERD (gastroesophageal reflux disease)    H/O hiatal hernia    Headache    "weekly" (08/23/2017)   History of echocardiogram    Echo 6/16:  Mod LVH, EF 60-65%, no RWMA, Gr 1 DD, trivial MR, normal LA size.   Hyperglycemia 11/20/2015    Hyperlipidemia, mixed 09/11/2007   Qualifier: Diagnosis of  By: Jerold Coombe   Did not tolerate Lipitor, zocor, Lovastatin, Pravastatin, Livalo, Crestor even low dose    IBS (irritable bowel syndrome)    Maxillary sinusitis    Normal cardiac stress test 11/2011   No evidence of ischemia or infarct.   Calculated ejection fraction 72%.   Obesity    OSA (obstructive sleep apnea) 02/2012   has stopped using  cpap   Osteoarthritis    Osteoporosis    Pneumonia 11/2011   "before 2013 I hadn't had pneumonia since I was a child" (04/13/2012)   Pulmonary nodules    S/P angioplasty with stent 08/23/17 ostial 2nd OM with DES synnergy 08/24/2017   Schatzki's ring     Past Surgical History:  Procedure Laterality Date   APPENDECTOMY  1989   CARDIAC CATHETERIZATION N/A 11/25/2014   Procedure: Right/Left Heart Cath and Coronary Angiography;  Surgeon: Belva Crome, MD;  Location: [facility];  Service: Cardiovascular;  Laterality: N/A;   [place] WITH STENT PLACEMENT  08/23/2017   CORONARY STENT INTERVENTION N/A 08/23/2017   Procedure: CORONARY STENT INTERVENTION;  Surgeon: Burnell Blanks, MD;  Location: [facility];  Service: Cardiovascular;  Laterality: N/A;   HERNIA REPAIR  04/13/2012   VHR laparoscopic   LEFT HEART CATH AND CORONARY ANGIOGRAPHY N/A 08/23/2017   Procedure: LEFT HEART CATH AND CORONARY ANGIOGRAPHY;  Surgeon: Burnell Blanks, MD;  Location: [facility];  Service: Cardiovascular;  Laterality: N/A;   VENTRAL HERNIA REPAIR  04/13/2012   Procedure: LAPAROSCOPIC VENTRAL HERNIA;  Surgeon: Adin Hector, MD;  Location: [facility];  Service: General;  Laterality: N/A;  laparoscopic repair of incarcerated hernia    Family History  Problem Relation Age of Onset   Breast cancer Mother    Hypertension Mother    Diabetes Mother    Cancer Mother        recurrent, metastatic breast cancer.    Diverticulosis Father    Prostate  cancer Father    Breast cancer Sister    Cancer Sister        breast cancer, invasive ductal carcinoma in 2022,DCIS at 65 with 4 weeks of radiation, 5 years of Tamoxifen    Pulmonary embolism Brother        recurrent   Heart attack Maternal Grandfather    Cancer Niece 39   Osteoporosis Niece    Stroke Neg Hx    Colon cancer Neg Hx    Esophageal cancer Neg Hx    Stomach cancer Neg Hx    Rectal cancer Neg Hx     Social History   Socioeconomic History   Marital status: Married    Spouse name: Not on file   Number of children: 1   Years of education: 14   Highest education level: Associate degree: academic program  Occupational History   Occupation: Disabled   Tobacco Use   Smoking status: Never   Smokeless tobacco: Never  Scientific laboratory technician Use: Never used  Substance and Sexual Activity   Alcohol use: Yes    Comment: social use   Drug use: No   Sexual activity: Yes    Comment: gluten free, lives with husband and son with CP quadriplegia  Other Topics Concern   Not on file  Social History Narrative   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    Lives at home with her husband and son.   Right-handed.   2 cups caffeine per day.   One story home   Social Determinants of Health   Financial Resource Strain: Low Risk    Difficulty of Paying Living Expenses: Not hard at all  Food Insecurity: No Food Insecurity   Worried About Charity fundraiser in the Last Year: Never true   Arboriculturist in the Last Year: Never true  Transportation Needs: No Transportation Needs   Lack of Transportation (Medical): No   Lack of Transportation (Non-Medical): No  Physical Activity: Insufficiently Active   Days of Exercise per Week: 7 days   Minutes of Exercise per Session: 20 min  Stress: Stress Concern Present   Feeling of Stress : Rather much  Social Connections: Moderately Integrated   Frequency of Communication with Friends and Family: More than three times a week   Frequency of  Social Gatherings with Friends and Family: Once a week   Attends Religious Services: More than 4 times per year   Active Member of [employer] or Organizations: No   Attends Archivist Meetings: Never   Marital Status: Married  Human resources officer Violence: Not At Risk   Fear of Current or Ex-Partner: No   Emotionally Abused: No  Physically Abused: No   Sexually Abused: No    Outpatient Medications Prior to Visit  Medication Sig Dispense Refill   acyclovir ointment (ZOVIRAX) 5 % Apply 1 application topically every 3 (three) hours. 15 g 2   albuterol (VENTOLIN HFA) 108 (90 Base) MCG/ACT inhaler Inhale 1-2 puffs into the lungs every 6 (six) hours as needed. 8 g 2   aspirin 81 MG tablet Take 81 mg by mouth daily.     Benralizumab (FASENRA PEN) 30 MG/ML SOAJ Inject 1 mL (30 mg total) into the skin every 8 (eight) weeks. 1 mL 2   Bioflavonoid Products (ESTER C PO) Take 500 mg by mouth daily.     Calcium-Magnesium-Vitamin D (CALCIUM MAGNESIUM PO) Take 1 tablet by mouth daily.     DULoxetine (CYMBALTA) 30 MG capsule TAKE 3 CAPSULES (90 MG TOTAL) BY MOUTH DAILY. 90 capsule 5   Evolocumab (REPATHA SURECLICK) 195 MG/ML SOAJ INJECT 1 PEN INTO THE SKIN EVERY 14 DAYS 6 mL 3   famotidine (PEPCID) 20 MG tablet TAKE 1 TABLET (20 MG TOTAL) BY MOUTH AT BEDTIME. 30 tablet 3   HYDROcodone-acetaminophen (NORCO/VICODIN) 5-325 MG tablet Take 1 tablet by mouth every 8 (eight) hours as needed. 12 tablet 0   ipratropium-albuterol (DUONEB) 0.5-2.5 (3) MG/3ML SOLN USE 1 VIAL BY NEBULIZATION EVERY 6 (SIX) HOURS AS NEEDED. 360 mL 5   LORazepam (ATIVAN) 1 MG tablet TAKE 1/2 TO 1 TABLET BY MOUTH 2 TIMES A DAY AS NEEDED FOR ANXIETY OR SLEEP 30 tablet 2   meloxicam (MOBIC) 7.5 MG tablet TAKE 1 TO 2 TABLETS BY MOUTH DAILY 60 tablet 0   nitroGLYCERIN (NITROSTAT) 0.4 MG SL tablet PLACE 1 TABLET UNDER THE TONGUE EVERY 5 MINUTES AS NEEDED FOR CHEST PAIN. 25 tablet 4   VITAMIN D PO Take 5,000 Units by mouth daily.     ZINC  CITRATE PO Take 30 mg by mouth daily.     denosumab (PROLIA) 60 MG/ML SOSY injection Inject 60 mg into the skin every 6 (six) months.     No facility-administered medications prior to visit.    Allergies  Allergen Reactions   Beclomethasone Dipropionate Hives and Other (See Comments)     weight gain   Flexeril [Cyclobenzaprine] Anxiety   Mometasone Furo-Formoterol Fum Hives and Other (See Comments)    weight gain   Sulfonamide Derivatives Hives and Rash   Statins     Myalgias, RLS    Review of Systems  Gastrointestinal:  Positive for abdominal pain (Some Tenderness and Noisiness) and diarrhea. Negative for blood in stool, melena and nausea.      Objective:    Physical Exam Constitutional:      General: She is not in acute distress.    Appearance: Normal appearance. She is not ill-appearing.  HENT:     Head: Normocephalic and atraumatic.     Right Ear: External ear normal.     Left Ear: External ear normal.  Eyes:     Extraocular Movements: Extraocular movements intact.     Pupils: Pupils are equal, round, and reactive to light.  Cardiovascular:     Rate and Rhythm: Normal rate and regular rhythm.     Heart sounds: Normal heart sounds. No murmur heard.   No gallop.  Pulmonary:     Effort: Pulmonary effort is normal. No respiratory distress.     Breath sounds: Normal breath sounds. No wheezing or rales.  Skin:    General: Skin is warm and dry.  Neurological:  Mental Status: She is alert and oriented to person, place, and time.  Psychiatric:        Mood and Affect: Mood normal.        Behavior: Behavior normal.        Judgment: Judgment normal.    BP 119/70 (BP Location: Right Arm, Patient Position: Sitting, Cuff Size: Small)   Pulse 73   Temp 98.7 F (37.1 C) (Oral)   Resp 16   Wt 151 lb (68.5 kg)   SpO2 100%   BMI 29.49 kg/m  Wt Readings from Last 3 Encounters:  11/16/21 151 lb (68.5 kg)  10/05/21 151 lb 9.6 oz (68.8 kg)  10/04/21 153 lb (69.4 kg)        Assessment & Plan:   Problem List Items Addressed This Visit       Unprioritized   Osteoporosis    Reports prolia is too expensive at our office but she can get for $100 if she brings the dose into our office for administration. Rx sent to her pharmacy.        Relevant Medications   denosumab (PROLIA) 60 MG/ML SOSY injection   Diarrhea - Primary    New. Present x 6 weeks. Will obtain GI profile. She already has an appointment scheduled with her GI and I encouraged her to keep this appointment.        Relevant Orders   GI Profile, Stool, PCR      Meds ordered this encounter  Medications   denosumab (PROLIA) 60 MG/ML SOSY injection    Sig: Inject 60 mg into the skin once for 1 dose.    Dispense:  1 mL    Refill:  0    Order Specific Question:   Supervising Provider    Answer:   [name] A [[id number]]    I, Nance Pear, NP, personally preformed the services described in this documentation.  All medical record entries made by the scribe were at my direction and in my presence.  I have reviewed the chart and discharge instructions (if applicable) and agree that the record reflects my personal performance and is accurate and complete. 11/16/2021   I,Amber [name],acting as a scribe for Nance Pear, NP.,have documented all relevant documentation on the behalf of Nance Pear, NP,as directed by  Nance Pear, NP while in the presence of Nance Pear, NP.    Nance Pear, NP

## 2021-11-16 NOTE — Patient Instructions (Signed)
Please complete stool specimen and return at your earliest convenience.  Please keep your upcoming appointment with Victoria Savoy NP.

## 2021-11-16 NOTE — Assessment & Plan Note (Signed)
New. Present x 6 weeks. Will obtain GI profile. She already has an appointment scheduled with her GI and I encouraged her to keep this appointment.

## 2021-11-16 NOTE — Addendum Note (Signed)
Addended by: Kelle Darting A on: 11/16/2021 10:53 AM   Modules accepted: Orders

## 2021-11-17 ENCOUNTER — Other Ambulatory Visit: Payer: Self-pay | Admitting: Internal Medicine

## 2021-11-17 ENCOUNTER — Other Ambulatory Visit: Payer: Self-pay | Admitting: Family Medicine

## 2021-11-17 DIAGNOSIS — K219 Gastro-esophageal reflux disease without esophagitis: Secondary | ICD-10-CM

## 2021-11-17 DIAGNOSIS — Z8719 Personal history of other diseases of the digestive system: Secondary | ICD-10-CM

## 2021-11-18 ENCOUNTER — Other Ambulatory Visit: Payer: PPO

## 2021-11-18 ENCOUNTER — Ambulatory Visit (INDEPENDENT_AMBULATORY_CARE_PROVIDER_SITE_OTHER): Payer: PPO

## 2021-11-18 DIAGNOSIS — M9901 Segmental and somatic dysfunction of cervical region: Secondary | ICD-10-CM | POA: Diagnosis not present

## 2021-11-18 DIAGNOSIS — R197 Diarrhea, unspecified: Secondary | ICD-10-CM

## 2021-11-18 DIAGNOSIS — M810 Age-related osteoporosis without current pathological fracture: Secondary | ICD-10-CM

## 2021-11-18 DIAGNOSIS — M4124 Other idiopathic scoliosis, thoracic region: Secondary | ICD-10-CM | POA: Diagnosis not present

## 2021-11-18 DIAGNOSIS — M4004 Postural kyphosis, thoracic region: Secondary | ICD-10-CM | POA: Diagnosis not present

## 2021-11-18 DIAGNOSIS — M9902 Segmental and somatic dysfunction of thoracic region: Secondary | ICD-10-CM | POA: Diagnosis not present

## 2021-11-18 MED ORDER — DENOSUMAB 60 MG/ML ~~LOC~~ SOSY
60.0000 mg | PREFILLED_SYRINGE | Freq: Once | SUBCUTANEOUS | Status: AC
  Administered 2021-11-18: 60 mg via SUBCUTANEOUS

## 2021-11-18 NOTE — Progress Notes (Signed)
Patient here for Prolia injection per Dr. Charlett Blake.   Injection given in left arm and patient tolerated well.

## 2021-11-20 LAB — GI PROFILE, STOOL, PCR

## 2021-11-21 ENCOUNTER — Telehealth: Payer: Self-pay | Admitting: Family

## 2021-11-21 NOTE — Telephone Encounter (Signed)
GI profile is normal.  No obvious infectious cause for her diarrhea.  Is she still having diarrhea?  If so, please place referral to [facility].  Dx diarrhea.

## 2021-11-22 ENCOUNTER — Ambulatory Visit: Payer: PPO | Admitting: Family Medicine

## 2021-11-22 DIAGNOSIS — M4124 Other idiopathic scoliosis, thoracic region: Secondary | ICD-10-CM | POA: Diagnosis not present

## 2021-11-22 DIAGNOSIS — M4004 Postural kyphosis, thoracic region: Secondary | ICD-10-CM | POA: Diagnosis not present

## 2021-11-22 DIAGNOSIS — M9901 Segmental and somatic dysfunction of cervical region: Secondary | ICD-10-CM | POA: Diagnosis not present

## 2021-11-22 DIAGNOSIS — M9902 Segmental and somatic dysfunction of thoracic region: Secondary | ICD-10-CM | POA: Diagnosis not present

## 2021-11-22 DIAGNOSIS — M25552 Pain in left hip: Secondary | ICD-10-CM | POA: Diagnosis not present

## 2021-11-22 NOTE — Telephone Encounter (Signed)
Patient advised of results and she is already scheduled to see GI on 12-06-2021

## 2021-11-24 ENCOUNTER — Encounter: Payer: Self-pay | Admitting: Family

## 2021-11-25 ENCOUNTER — Telehealth (INDEPENDENT_AMBULATORY_CARE_PROVIDER_SITE_OTHER): Payer: PPO | Admitting: Family Medicine

## 2021-11-25 ENCOUNTER — Encounter: Payer: Self-pay | Admitting: Family Medicine

## 2021-11-25 DIAGNOSIS — F32A Depression, unspecified: Secondary | ICD-10-CM

## 2021-11-25 DIAGNOSIS — F419 Anxiety disorder, unspecified: Secondary | ICD-10-CM | POA: Diagnosis not present

## 2021-11-25 DIAGNOSIS — G8929 Other chronic pain: Secondary | ICD-10-CM | POA: Diagnosis not present

## 2021-11-25 DIAGNOSIS — M9901 Segmental and somatic dysfunction of cervical region: Secondary | ICD-10-CM | POA: Diagnosis not present

## 2021-11-25 DIAGNOSIS — M4004 Postural kyphosis, thoracic region: Secondary | ICD-10-CM | POA: Diagnosis not present

## 2021-11-25 DIAGNOSIS — M4124 Other idiopathic scoliosis, thoracic region: Secondary | ICD-10-CM | POA: Diagnosis not present

## 2021-11-25 DIAGNOSIS — R197 Diarrhea, unspecified: Secondary | ICD-10-CM | POA: Diagnosis not present

## 2021-11-25 DIAGNOSIS — M9902 Segmental and somatic dysfunction of thoracic region: Secondary | ICD-10-CM | POA: Diagnosis not present

## 2021-11-25 MED ORDER — DIPHENOXYLATE-ATROPINE 2.5-0.025 MG PO TABS: 1.0000 | ORAL_TABLET | Freq: Four times a day (QID) | ORAL | 0 refills | Status: DC | PRN

## 2021-11-25 NOTE — Progress Notes (Deleted)
MyChart Video Visit    Virtual Visit via Video Note   This visit type was conducted due to national recommendations for restrictions regarding the COVID-19 Pandemic (e.g. social distancing) in an effort to limit this patient's exposure and mitigate transmission in our community. This patient is at least at moderate risk for complications without adequate follow up. This format is felt to be most appropriate for this patient at this time. Physical exam was limited by quality of the video and audio technology used for the visit.  was able to get the patient set up on a video visit.  Patient location: *** Patient and provider in visit Provider location: Office  I discussed the limitations of evaluation and management by telemedicine and the availability of in person appointments. The patient expressed understanding and agreed to proceed.  Visit Date: 11/25/2021  Today's healthcare provider: [name], MD     Subjective:    Patient ID: Victoria [name], female    DOB: 1956-02-26, 66 y.o.   MRN: [id number]  No chief complaint on file.   HPI Patient is in today for ***  Past Medical History:  Diagnosis Date   Allergic bronchopulmonary aspergillosis ([place]) 2008   sees Dr Edmund Hilda pulmonology   Anemia    iron deficiency, resolved   Anxiety    Asthma    CAD (coronary artery disease)    a. LHC 6/16:  oOM1 60, pRCA 25 >> med Rx b. cath 3/19 2nd OM with 95% stenosis s/p synergy DES & anomalous RCA   CAP (community acquired pneumonia) 2016; 06/07/2016   Chronic bronchitis (HCC)    Chronic lower back pain    Complication of anesthesia    "think I have a hard time waking up from it"   COPD (chronic obstructive pulmonary disease) ([place])    Depression    mild   Diverticulitis    Diverticulosis    GERD (gastroesophageal reflux disease)    H/O hiatal hernia    Headache    "weekly" (08/23/2017)   History of echocardiogram    Echo 6/16:  Mod LVH, EF 60-65%, no RWMA, Gr 1  DD, trivial MR, normal LA size.   Hyperglycemia 11/20/2015   Hyperlipidemia, mixed 09/11/2007   Qualifier: Diagnosis of  By: Jerold Coombe   Did not tolerate Lipitor, zocor, Lovastatin, Pravastatin, Livalo, Crestor even low dose    IBS (irritable bowel syndrome)    Maxillary sinusitis    Normal cardiac stress test 11/2011   No evidence of ischemia or infarct.   Calculated ejection fraction 72%.   Obesity    OSA (obstructive sleep apnea) 02/2012   has stopped using  cpap   Osteoarthritis    Osteoporosis    Pneumonia 11/2011   "before 2013 I hadn't had pneumonia since I was a child" (04/13/2012)   Pulmonary nodules    S/P angioplasty with stent 08/23/17 ostial 2nd OM with DES synnergy 08/24/2017   Schatzki's ring     Past Surgical History:  Procedure Laterality Date   APPENDECTOMY  1989   CARDIAC CATHETERIZATION N/A 11/25/2014   Procedure: Right/Left Heart Cath and Coronary Angiography;  Surgeon: Belva Crome, MD;  Location: [facility];  Service: Cardiovascular;  Laterality: N/A;   [place] WITH STENT PLACEMENT  08/23/2017   CORONARY STENT INTERVENTION N/A 08/23/2017   Procedure: CORONARY STENT INTERVENTION;  Surgeon: Burnell Blanks, MD;  Location: [facility];  Service:  Cardiovascular;  Laterality: N/A;   HERNIA REPAIR  04/13/2012   VHR laparoscopic   LEFT HEART CATH AND CORONARY ANGIOGRAPHY N/A 08/23/2017   Procedure: LEFT HEART CATH AND CORONARY ANGIOGRAPHY;  Surgeon: Burnell Blanks, MD;  Location: [facility];  Service: Cardiovascular;  Laterality: N/A;   VENTRAL HERNIA REPAIR  04/13/2012   Procedure: LAPAROSCOPIC VENTRAL HERNIA;  Surgeon: Adin Hector, MD;  Location: [facility];  Service: General;  Laterality: N/A;  laparoscopic repair of incarcerated hernia    Family History  Problem Relation Age of Onset   Breast cancer Mother    Hypertension Mother    Diabetes Mother    Cancer Mother        recurrent,  metastatic breast cancer.    Diverticulosis Father    Prostate cancer Father    Breast cancer Sister    Cancer Sister        breast cancer, invasive ductal carcinoma in 2022,DCIS at 73 with 4 weeks of radiation, 5 years of Tamoxifen    Pulmonary embolism Brother        recurrent   Heart attack Maternal Grandfather    Cancer Niece 20   Osteoporosis Niece    Stroke Neg Hx    Colon cancer Neg Hx    Esophageal cancer Neg Hx    Stomach cancer Neg Hx    Rectal cancer Neg Hx     Social History   Socioeconomic History   Marital status: Married    Spouse name: Not on file   Number of children: 1   Years of education: 14   Highest education level: Associate degree: academic program  Occupational History   Occupation: Disabled   Tobacco Use   Smoking status: Never   Smokeless tobacco: Never  Scientific laboratory technician Use: Never used  Substance and Sexual Activity   Alcohol use: Yes    Comment: social use   Drug use: No   Sexual activity: Yes    Comment: gluten free, lives with husband and son with CP quadriplegia  Other Topics Concern   Not on file  Social History Narrative   Cares for son with cerebral palsy.    Lives at home with her husband and son.   Right-handed.   2 cups caffeine per day.   One story home   Social Determinants of Health   Financial Resource Strain: Low Risk  (10/19/2021)   Overall Financial Resource Strain (CARDIA)    Difficulty of Paying Living Expenses: Not hard at all  Food Insecurity: No Food Insecurity (10/19/2021)   Hunger Vital Sign    Worried About Running Out of Food in the Last Year: Never true    Ran Out of Food in the Last Year: Never true  Transportation Needs: No Transportation Needs (10/19/2021)   PRAPARE - Hydrologist (Medical): No    Lack of Transportation (Non-Medical): No  Physical Activity: Insufficiently Active (10/19/2021)   Exercise Vital Sign    Days of Exercise per Week: 7 days    Minutes of Exercise  per Session: 20 min  Stress: Stress Concern Present (10/19/2021)   [place]    Feeling of Stress : Rather much  Social Connections: Moderately Integrated (10/19/2021)   Social Connection and Isolation Panel [NHANES]    Frequency of Communication with Friends and Family: More than three times a week    Frequency of Social Gatherings with Friends  and Family: Once a week    Attends Religious Services: More than 4 times per year    Active Member of Clubs or Organizations: No    Attends Archivist Meetings: Never    Marital Status: Married  Human resources officer Violence: Not At Risk (10/19/2021)   Humiliation, Afraid, Rape, and Kick questionnaire    Fear of Current or Ex-Partner: No    Emotionally Abused: No    Physically Abused: No    Sexually Abused: No    Outpatient Medications Prior to Visit  Medication Sig Dispense Refill   acyclovir ointment (ZOVIRAX) 5 % Apply 1 application topically every 3 (three) hours. 15 g 2   albuterol (VENTOLIN HFA) 108 (90 Base) MCG/ACT inhaler Inhale 1-2 puffs into the lungs every 6 (six) hours as needed. 8 g 2   aspirin 81 MG tablet Take 81 mg by mouth daily.     Benralizumab (FASENRA PEN) 30 MG/ML SOAJ Inject 1 mL (30 mg total) into the skin every 8 (eight) weeks. 1 mL 2   Bioflavonoid Products (ESTER C PO) Take 500 mg by mouth daily.     Calcium-Magnesium-Vitamin D (CALCIUM MAGNESIUM PO) Take 1 tablet by mouth daily.     DULoxetine (CYMBALTA) 30 MG capsule TAKE 3 CAPSULES (90 MG TOTAL) BY MOUTH DAILY. 90 capsule 5   Evolocumab (REPATHA SURECLICK) 747 MG/ML SOAJ INJECT 1 PEN INTO THE SKIN EVERY 14 DAYS 6 mL 3   famotidine (PEPCID) 20 MG tablet TAKE 1 TABLET BY MOUTH TWICE A DAY 60 tablet 2   HYDROcodone-acetaminophen (NORCO/VICODIN) 5-325 MG tablet Take 1 tablet by mouth every 8 (eight) hours as needed. 12 tablet 0   ipratropium-albuterol (DUONEB) 0.5-2.5 (3) MG/3ML SOLN USE 1 VIAL BY  NEBULIZATION EVERY 6 (SIX) HOURS AS NEEDED. 360 mL 5   LORazepam (ATIVAN) 1 MG tablet TAKE 1/2 TO 1 TABLET BY MOUTH 2 TIMES A DAY AS NEEDED FOR ANXIETY OR SLEEP 30 tablet 2   meloxicam (MOBIC) 7.5 MG tablet TAKE 1 TO 2 TABLETS BY MOUTH DAILY 60 tablet 0   nitroGLYCERIN (NITROSTAT) 0.4 MG SL tablet PLACE 1 TABLET UNDER THE TONGUE EVERY 5 MINUTES AS NEEDED FOR CHEST PAIN. 25 tablet 4   VITAMIN D PO Take 5,000 Units by mouth daily.     ZINC CITRATE PO Take 30 mg by mouth daily.     No facility-administered medications prior to visit.    Allergies  Allergen Reactions   Beclomethasone Dipropionate Hives and Other (See Comments)     weight gain   Flexeril [Cyclobenzaprine] Anxiety   Mometasone Furo-Formoterol Fum Hives and Other (See Comments)    weight gain   Sulfonamide Derivatives Hives and Rash   Statins     Myalgias, RLS    ROS     Objective:    Physical Exam  There were no vitals taken for this visit. Wt Readings from Last 3 Encounters:  11/16/21 151 lb (68.5 kg)  10/05/21 151 lb 9.6 oz (68.8 kg)  10/04/21 153 lb (69.4 kg)    Diabetic Foot Exam - Simple   No data filed    Lab Results  Component Value Date   WBC 8.5 09/23/2021   HGB 12.9 09/23/2021   HCT 38.7 09/23/2021   PLT 303 09/23/2021   GLUCOSE 90 09/23/2021   CHOL 172 07/06/2021   TRIG 110.0 07/06/2021   HDL 55.30 07/06/2021   LDLDIRECT 177.8 09/07/2011   LDLCALC 95 07/06/2021   ALT 24 09/23/2021  AST 20 09/23/2021   NA 135 09/23/2021   K 4.2 09/23/2021   CL 99 09/23/2021   CREATININE 0.57 09/23/2021   BUN 21 09/23/2021   CO2 21 09/23/2021   TSH 1.82 07/06/2021   INR 1.0 08/21/2017   HGBA1C 5.5 07/06/2021    Lab Results  Component Value Date   TSH 1.82 07/06/2021   Lab Results  Component Value Date   WBC 8.5 09/23/2021   HGB 12.9 09/23/2021   HCT 38.7 09/23/2021   MCV 90 09/23/2021   PLT 303 09/23/2021   Lab Results  Component Value Date   NA 135 09/23/2021   K 4.2 09/23/2021    CO2 21 09/23/2021   GLUCOSE 90 09/23/2021   BUN 21 09/23/2021   CREATININE 0.57 09/23/2021   BILITOT <0.2 09/23/2021   ALKPHOS 63 09/23/2021   AST 20 09/23/2021   ALT 24 09/23/2021   PROT 6.3 09/23/2021   ALBUMIN 4.5 09/23/2021   CALCIUM 9.2 09/23/2021   ANIONGAP 6 08/24/2017   EGFR 101 09/23/2021   GFR 95.80 07/06/2021   Lab Results  Component Value Date   CHOL 172 07/06/2021   Lab Results  Component Value Date   HDL 55.30 07/06/2021   Lab Results  Component Value Date   LDLCALC 95 07/06/2021   Lab Results  Component Value Date   TRIG 110.0 07/06/2021   Lab Results  Component Value Date   CHOLHDL 3 07/06/2021   Lab Results  Component Value Date   HGBA1C 5.5 07/06/2021       Assessment & Plan:   Problem List Items Addressed This Visit   None   I am having Victoria [name] maintain her aspirin, acyclovir ointment, Bioflavonoid Products (ESTER C PO), VITAMIN D PO, nitroGLYCERIN, ZINC CITRATE PO, albuterol, Repatha SureClick, ipratropium-albuterol, Fasenra Pen, LORazepam, Calcium-Magnesium-Vitamin D (CALCIUM MAGNESIUM PO), DULoxetine, HYDROcodone-acetaminophen, meloxicam, and famotidine.  No orders of the defined types were placed in this encounter.   I discussed the assessment and treatment plan with the patient. The patient was provided an opportunity to ask questions and all were answered. The patient agreed with the plan and demonstrated an understanding of the instructions.   The patient was advised to call back or seek an in-person evaluation if the symptoms worsen or if the condition fails to improve as anticipated.  I provided *** minutes of face-to-face time during this encounter.   [name], MD [facility] [phone] (phone) [phone] (fax)  [place]

## 2021-11-25 NOTE — Telephone Encounter (Signed)
Pt added to schedule

## 2021-11-25 NOTE — Assessment & Plan Note (Addendum)
She is having numerous loose stool daily but no bloody or tarry stool. It correlates to some extent with the switch to Cymbalta so for now will switch back to Venlafaxine and see if that helps. Her infectious work up was negative so she will be given Lomotil to use prn. She is encouraged to add Benefiber daily and she has an appt with gastroenterology soon.

## 2021-11-25 NOTE — Progress Notes (Signed)
MyChart Video Visit    Virtual Visit via Video Note   This visit type was conducted due to national recommendations for restrictions regarding the COVID-19 Pandemic (e.g. social distancing) in an effort to limit this patient's exposure and mitigate transmission in our community. This patient is at least at moderate risk for complications without adequate follow up. This format is felt to be most appropriate for this patient at this time. Physical exam was limited by quality of the video and audio technology used for the visit. [name]., CMA was able to get the patient set up on a video visit.  Patient location: Home Patient and provider in visit Provider location: Office  I discussed the limitations of evaluation and management by telemedicine and the availability of in person appointments. The patient expressed understanding and agreed to proceed.  Visit Date: 11/25/2021  Today's healthcare provider: [name], MD     Subjective:    Patient ID: Victoria [name], female    DOB: Apr 02, 1956, 66 y.o.   MRN: [id number]  Chief Complaint  Patient presents with   Diarrhea    HPI Patient is in today for IBS and chronic medical concerns. No recent febrile illness or hospitalizations. She continues to struggle with daily joint and back pain and has trouble getting comfortable. She is having numerous loose stool daily but no bloody or tarry stool. It correlates to some extent with the switch to Cymbalta, infectious work up has been negative, no bloodt or tarry stool. Denies CP/palp/SOB/HA/congestion/fevers or GU c/o. Taking meds as prescribed.  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 LHC 6/16:  oOM1 60, pRCA 25 >> med Rx b. cath 3/19 2nd OM with 95% stenosis s/p synergy DES & anomalous RCA    CAP (community acquired pneumonia) 2016; 06/07/2016   Chronic bronchitis (HCC)    Chronic lower back pain    Complication of anesthesia    "think I have a hard time waking up from it"   COPD (chronic obstructive pulmonary disease) ([place])    Depression    mild   Diverticulitis    Diverticulosis    GERD (gastroesophageal reflux disease)    H/O hiatal hernia    Headache    "weekly" (08/23/2017)   History of echocardiogram    Echo 6/16:  Mod LVH, EF 60-65%, no RWMA, Gr 1 DD, trivial MR, normal LA size.   Hyperglycemia 11/20/2015   Hyperlipidemia, mixed 09/11/2007   Qualifier: Diagnosis of  By: Jerold Coombe   Did not tolerate Lipitor, zocor, Lovastatin, Pravastatin, Livalo, Crestor even low dose    IBS (irritable bowel syndrome)    Maxillary sinusitis    Normal cardiac stress test 11/2011   No evidence of ischemia or infarct.   Calculated ejection fraction 72%.   Obesity    OSA (obstructive sleep apnea) 02/2012   has stopped using  cpap   Osteoarthritis    Osteoporosis    Pneumonia 11/2011   "before 2013 I hadn't had pneumonia since I was a child" (04/13/2012)   Pulmonary nodules    S/P angioplasty with stent 08/23/17 ostial 2nd OM with DES synnergy 08/24/2017   Schatzki's ring     Past Surgical History:  Procedure Laterality Date   APPENDECTOMY  1989  CARDIAC CATHETERIZATION N/A 11/25/2014   Procedure: Right/Left Heart Cath and Coronary Angiography;  Surgeon: Belva Crome, MD;  Location: [facility];  Service: Cardiovascular;  Laterality: N/A;   [place] WITH STENT PLACEMENT  08/23/2017   CORONARY STENT INTERVENTION N/A 08/23/2017   Procedure: CORONARY STENT INTERVENTION;  Surgeon: Burnell Blanks, MD;  Location: [facility];  Service: Cardiovascular;  Laterality: N/A;   HERNIA REPAIR  04/13/2012   VHR laparoscopic   LEFT HEART CATH AND CORONARY ANGIOGRAPHY N/A 08/23/2017   Procedure: LEFT HEART CATH AND CORONARY ANGIOGRAPHY;   Surgeon: Burnell Blanks, MD;  Location: [facility];  Service: Cardiovascular;  Laterality: N/A;   VENTRAL HERNIA REPAIR  04/13/2012   Procedure: LAPAROSCOPIC VENTRAL HERNIA;  Surgeon: Adin Hector, MD;  Location: [facility];  Service: General;  Laterality: N/A;  laparoscopic repair of incarcerated hernia    Family History  Problem Relation Age of Onset   Breast cancer Mother    Hypertension Mother    Diabetes Mother    Cancer Mother        recurrent, metastatic breast cancer.    Diverticulosis Father    Prostate cancer Father    Breast cancer Sister    Cancer Sister        breast cancer, invasive ductal carcinoma in 2022,DCIS at 64 with 4 weeks of radiation, 5 years of Tamoxifen    Pulmonary embolism Brother        recurrent   Heart attack Maternal Grandfather    Cancer Niece 45   Osteoporosis Niece    Stroke Neg Hx    Colon cancer Neg Hx    Esophageal cancer Neg Hx    Stomach cancer Neg Hx    Rectal cancer Neg Hx     Social History   Socioeconomic History   Marital status: Married    Spouse name: Not on file   Number of children: 1   Years of education: 14   Highest education level: Associate degree: academic program  Occupational History   Occupation: Disabled   Tobacco Use   Smoking status: Never   Smokeless tobacco: Never  Scientific laboratory technician Use: Never used  Substance and Sexual Activity   Alcohol use: Yes    Comment: social use   Drug use: No   Sexual activity: Yes    Comment: gluten free, lives with husband and son with CP quadriplegia  Other Topics Concern   Not on file  Social History Narrative   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    Lives at home with her husband and son.   Right-handed.   2 cups caffeine per day.   One story home   Social Determinants of Health   Financial Resource Strain: Low Risk  (10/19/2021)   Overall Financial Resource Strain (CARDIA)    Difficulty of Paying Living Expenses: Not hard at all  Food  Insecurity: No Food Insecurity (10/19/2021)   Hunger Vital Sign    Worried About Running Out of Food in the Last Year: Never true    Ran Out of Food in the Last Year: Never true  Transportation Needs: No Transportation Needs (10/19/2021)   PRAPARE - Hydrologist (Medical): No    Lack of Transportation (Non-Medical): No  Physical Activity: Insufficiently Active (10/19/2021)   Exercise Vital Sign    Days of Exercise per Week: 7 days  Minutes of Exercise per Session: 20 min  Stress: Stress Concern Present (10/19/2021)   [place]    Feeling of Stress : Rather much  Social Connections: Moderately Integrated (10/19/2021)   Social Connection and Isolation Panel [NHANES]    Frequency of Communication with Friends and Family: More than three times a week    Frequency of Social Gatherings with Friends and Family: Once a week    Attends Religious Services: More than 4 times per year    Active Member of [employer] or Organizations: No    Attends Archivist Meetings: Never    Marital Status: Married  Human resources officer Violence: Not At Risk (10/19/2021)   Humiliation, Afraid, Rape, and Kick questionnaire    Fear of Current or Ex-Partner: No    Emotionally Abused: No    Physically Abused: No    Sexually Abused: No    Outpatient Medications Prior to Visit  Medication Sig Dispense Refill   acyclovir ointment (ZOVIRAX) 5 % Apply 1 application topically every 3 (three) hours. 15 g 2   albuterol (VENTOLIN HFA) 108 (90 Base) MCG/ACT inhaler Inhale 1-2 puffs into the lungs every 6 (six) hours as needed. 8 g 2   aspirin 81 MG tablet Take 81 mg by mouth daily.     Benralizumab (FASENRA PEN) 30 MG/ML SOAJ Inject 1 mL (30 mg total) into the skin every 8 (eight) weeks. 1 mL 2   Bioflavonoid Products (ESTER C PO) Take 500 mg by mouth daily.     Calcium-Magnesium-Vitamin D (CALCIUM MAGNESIUM PO) Take 1 tablet by mouth  daily.     DULoxetine (CYMBALTA) 30 MG capsule TAKE 3 CAPSULES (90 MG TOTAL) BY MOUTH DAILY. 90 capsule 5   Evolocumab (REPATHA SURECLICK) 552 MG/ML SOAJ INJECT 1 PEN INTO THE SKIN EVERY 14 DAYS 6 mL 3   famotidine (PEPCID) 20 MG tablet TAKE 1 TABLET BY MOUTH TWICE A DAY 60 tablet 2   HYDROcodone-acetaminophen (NORCO/VICODIN) 5-325 MG tablet Take 1 tablet by mouth every 8 (eight) hours as needed. 12 tablet 0   ipratropium-albuterol (DUONEB) 0.5-2.5 (3) MG/3ML SOLN USE 1 VIAL BY NEBULIZATION EVERY 6 (SIX) HOURS AS NEEDED. 360 mL 5   LORazepam (ATIVAN) 1 MG tablet TAKE 1/2 TO 1 TABLET BY MOUTH 2 TIMES A DAY AS NEEDED FOR ANXIETY OR SLEEP 30 tablet 2   meloxicam (MOBIC) 7.5 MG tablet TAKE 1 TO 2 TABLETS BY MOUTH DAILY 60 tablet 0   nitroGLYCERIN (NITROSTAT) 0.4 MG SL tablet PLACE 1 TABLET UNDER THE TONGUE EVERY 5 MINUTES AS NEEDED FOR CHEST PAIN. 25 tablet 4   VITAMIN D PO Take 5,000 Units by mouth daily.     ZINC CITRATE PO Take 30 mg by mouth daily.     PROLIA 60 MG/ML SOSY injection SMARTSIG:60 Milligram(s) SUB-Q Once     No facility-administered medications prior to visit.    Allergies  Allergen Reactions   Beclomethasone Dipropionate Hives and Other (See Comments)     weight gain   Flexeril [Cyclobenzaprine] Anxiety   Mometasone Furo-Formoterol Fum Hives and Other (See Comments)    weight gain   Sulfonamide Derivatives Hives and Rash   Statins     Myalgias, RLS    Review of Systems  Constitutional:  Positive for malaise/fatigue. Negative for fever.  HENT:  Negative for congestion.   Eyes:  Negative for blurred vision.  Respiratory:  Negative for shortness of breath.   Cardiovascular:  Negative for  chest pain, palpitations and leg swelling.  Gastrointestinal:  Positive for diarrhea. Negative for abdominal pain, blood in stool, constipation, melena, nausea and vomiting.  Genitourinary:  Negative for dysuria and frequency.  Musculoskeletal:  Positive for back pain and joint pain.  Negative for falls.  Skin:  Negative for rash.  Neurological:  Negative for dizziness, loss of consciousness and headaches.  Endo/Heme/Allergies:  Negative for environmental allergies.  Psychiatric/Behavioral:  Positive for depression. The patient is nervous/anxious.        Objective:    Physical Exam Constitutional:      General: She is not in acute distress.    Appearance: Normal appearance. She is not ill-appearing or toxic-appearing.  HENT:     Head: Normocephalic and atraumatic.     Right Ear: External ear normal.     Left Ear: External ear normal.     Nose: Nose normal.  Eyes:     General:        Right eye: No discharge.        Left eye: No discharge.  Pulmonary:     Effort: Pulmonary effort is normal.  Skin:    Findings: No rash.  Neurological:     Mental Status: She is alert and oriented to person, place, and time.  Psychiatric:        Behavior: Behavior normal.     There were no vitals taken for this visit. Wt Readings from Last 3 Encounters:  11/16/21 151 lb (68.5 kg)  10/05/21 151 lb 9.6 oz (68.8 kg)  10/04/21 153 lb (69.4 kg)    Diabetic Foot Exam - Simple   No data filed    Lab Results  Component Value Date   WBC 8.5 09/23/2021   HGB 12.9 09/23/2021   HCT 38.7 09/23/2021   PLT 303 09/23/2021   GLUCOSE 90 09/23/2021   CHOL 172 07/06/2021   TRIG 110.0 07/06/2021   HDL 55.30 07/06/2021   LDLDIRECT 177.8 09/07/2011   LDLCALC 95 07/06/2021   ALT 24 09/23/2021   AST 20 09/23/2021   NA 135 09/23/2021   K 4.2 09/23/2021   CL 99 09/23/2021   CREATININE 0.57 09/23/2021   BUN 21 09/23/2021   CO2 21 09/23/2021   TSH 1.82 07/06/2021   INR 1.0 08/21/2017   HGBA1C 5.5 07/06/2021    Lab Results  Component Value Date   TSH 1.82 07/06/2021   Lab Results  Component Value Date   WBC 8.5 09/23/2021   HGB 12.9 09/23/2021   HCT 38.7 09/23/2021   MCV 90 09/23/2021   PLT 303 09/23/2021   Lab Results  Component Value Date   NA 135 09/23/2021   K  4.2 09/23/2021   CO2 21 09/23/2021   GLUCOSE 90 09/23/2021   BUN 21 09/23/2021   CREATININE 0.57 09/23/2021   BILITOT <0.2 09/23/2021   ALKPHOS 63 09/23/2021   AST 20 09/23/2021   ALT 24 09/23/2021   PROT 6.3 09/23/2021   ALBUMIN 4.5 09/23/2021   CALCIUM 9.2 09/23/2021   ANIONGAP 6 08/24/2017   EGFR 101 09/23/2021   GFR 95.80 07/06/2021   Lab Results  Component Value Date   CHOL 172 07/06/2021   Lab Results  Component Value Date   HDL 55.30 07/06/2021   Lab Results  Component Value Date   LDLCALC 95 07/06/2021   Lab Results  Component Value Date   TRIG 110.0 07/06/2021   Lab Results  Component Value Date   CHOLHDL 3 07/06/2021   Lab  Results  Component Value Date   HGBA1C 5.5 07/06/2021       Assessment & Plan:   Problem List Items Addressed This Visit     Chronic pain    Encouraged moist heat and gentle stretching as tolerated. May try NSAIDs, Tylenol prn and prescription meds as directed and report if symptoms worsen or seek immediate care. Consider adding topical treatments such as CBD Daily cream.       Anxiety and depression    She has been experiencing a great deal of stress due to the agitation of her adult son she cares for. Had switched from Venlafaxine to Cymbalta but she has experienced increased diarrhea but for now will switch back. Her son is doing some better so hopefully she will tolerate this switch      Diarrhea    She is having numerous loose stool daily but no bloody or tarry stool. It correlates to some extent with the switch to Cymbalta so for now will switch back to Venlafaxine and see if that helps. Her infectious work up was negative so she will be given Lomotil to use prn. She is encouraged to add Benefiber daily and she has an appt with gastroenterology soon.       I am having Victoria [name] start on diphenoxylate-atropine. I am also having her maintain her aspirin, acyclovir ointment, Bioflavonoid Products (ESTER C PO), VITAMIN D  PO, nitroGLYCERIN, ZINC CITRATE PO, albuterol, Repatha SureClick, ipratropium-albuterol, Fasenra Pen, LORazepam, Calcium-Magnesium-Vitamin D (CALCIUM MAGNESIUM PO), DULoxetine, HYDROcodone-acetaminophen, meloxicam, famotidine, and Prolia.  Meds ordered this encounter  Medications   diphenoxylate-atropine (LOMOTIL) 2.5-0.025 MG tablet    Sig: Take 1 tablet by mouth 4 (four) times daily as needed for diarrhea or loose stools.    Dispense:  40 tablet    Refill:  0    I discussed the assessment and treatment plan with the patient. The patient was provided an opportunity to ask questions and all were answered. The patient agreed with the plan and demonstrated an understanding of the instructions.   The patient was advised to call back or seek an in-person evaluation if the symptoms worsen or if the condition fails to improve as anticipated.    [name], MD [facility] [phone] (phone) [phone] (fax)  [place]

## 2021-11-26 NOTE — Assessment & Plan Note (Signed)
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 Had switched from Venlafaxine to Cymbalta but she has experienced increased diarrhea but for now will switch back. Her son is doing some better so hopefully she will tolerate this switch

## 2021-11-26 NOTE — Assessment & Plan Note (Signed)
Encouraged moist heat and gentle stretching as tolerated. May try NSAIDs, Tylenol prn and prescription meds as directed and report if symptoms worsen or seek immediate care. Consider adding topical treatments such as CBD Daily cream.

## 2021-12-01 DIAGNOSIS — M4124 Other idiopathic scoliosis, thoracic region: Secondary | ICD-10-CM | POA: Diagnosis not present

## 2021-12-01 DIAGNOSIS — M9901 Segmental and somatic dysfunction of cervical region: Secondary | ICD-10-CM | POA: Diagnosis not present

## 2021-12-01 DIAGNOSIS — M9902 Segmental and somatic dysfunction of thoracic region: Secondary | ICD-10-CM | POA: Diagnosis not present

## 2021-12-01 DIAGNOSIS — M4004 Postural kyphosis, thoracic region: Secondary | ICD-10-CM | POA: Diagnosis not present

## 2021-12-06 ENCOUNTER — Other Ambulatory Visit: Payer: PPO

## 2021-12-06 ENCOUNTER — Encounter: Payer: Self-pay | Admitting: Nurse Practitioner

## 2021-12-06 ENCOUNTER — Ambulatory Visit (INDEPENDENT_AMBULATORY_CARE_PROVIDER_SITE_OTHER): Payer: PPO | Admitting: Nurse Practitioner

## 2021-12-06 VITALS — BP 134/76 | HR 89 | Ht 61.0 in | Wt 149.0 lb

## 2021-12-06 DIAGNOSIS — R194 Change in bowel habit: Secondary | ICD-10-CM

## 2021-12-06 DIAGNOSIS — M4004 Postural kyphosis, thoracic region: Secondary | ICD-10-CM | POA: Diagnosis not present

## 2021-12-06 DIAGNOSIS — M9902 Segmental and somatic dysfunction of thoracic region: Secondary | ICD-10-CM | POA: Diagnosis not present

## 2021-12-06 DIAGNOSIS — M4124 Other idiopathic scoliosis, thoracic region: Secondary | ICD-10-CM | POA: Diagnosis not present

## 2021-12-06 DIAGNOSIS — M9901 Segmental and somatic dysfunction of cervical region: Secondary | ICD-10-CM | POA: Diagnosis not present

## 2021-12-06 NOTE — Progress Notes (Signed)
Patient Profile:  Victoria [name] is a 66 y.o. female known to Dr. Hilarie Fredrickson with a past medical history of GERD with large hiatal hernia, Schatzki's ring s/p balloon dilation in 2016, IBS, diverticulosis / diverticulitis vrs SCAD, anxiety, depression, pulmonary aspergillosis with eosinophilic asthma, bronchiectasis CAD s/p DES 2019, OSA, COPD.   Additional medical history as listed in [place] .  Assessment &  Plan    Bowel changes since April.  Having multiple non-bloody, loose ( sometimes watery) BMs a day including nocturnal ones. Doubt infectious . Microscopic colitis ? Supplement related diarrhea? SIBO? IBD seems unlikely as does EPI and celiac disease.     CBC, thyroid studies normal.  Stool pathogen panel earlier this month was negative making infectious etiology unlikely even though risk is probably increased since she is on more than one biologic Obtain stool lactoferrin to help determine if this is inflammatory type diarrhea   Continue imodium 1-2 times a day If diarrhea persists and lactoferrin negative then would first hold Mg+ supplement for a week or two even though she has taken it for a long time.  If no improvement then hold collagen supplement for a couple of weeks.  If diarrhea fails to improve consider empirically treating for SIBO She also started Mobic prior to onset of diarrhea but doubt it is the culprit Follow up with Dr. Hilarie Fredrickson in August. Will contact in interim with stool study results.   # Positive ANA, has consultation with Rheumatology in August.    HPI   Chief Complaint : loose stool  Last visit ( me) in January 2023 following a bout of recurrent diverticulitis. No significant abdominal pain since.   Interval History:  Victoria [name] typically has 2-4 partially formed bowel movements a day. Since early April she has been having twice that and stools are loose (not watery nor oily). She is having nocturnal bowel movements. She has a very noisy stomach which can wake her up  at night. She has had two visits with PCP. Stool pathogen panel on 6/1 was negative. She was prescribed lomotil but hasn't tried it due the potential for drowsiness. She is taking one imodium in the am which cuts the frequency of loose BMs by half.   She hasn't had any significant LLQ like she has had in the past.    In thinking about potential causes of diarrhea she did switch from effexor to cymbalta prior to bowel changes. She started taking Mobic in early February. She started collagen proteins a few months ago.   She is eating less carbs, trying to lose some weight but otherwise no diet changes. She hasn't paid attention to any correlation between the diarrhea and diary but dairy wasn't previously a problem. Not using artifical sweeteners.     Sister had similar issues in past and was diagnosed with IBS and doing well on Shariece Viveiros has been a little stressed. Her son has been battling PNA and she has had her own health issues. She has had several hip / back injections and is finally feeling better She does still need the daily mobic.  Weight is stable  Previous GI Evaluation  June 2021 colonoscopy  - Moderate diverticulosis from cecum to sigmoid colon. Erythema was seen in association with the diverticular opening in the sigmoid colon. Biopsied to exclude segmental colitis (SCAD). - Internal hemorrhoids. - The examination was otherwise normal. Surgical [P], colon, sigmoid bx - COLONIC MUCOSA WITH NO SPECIFIC HISTOPATHOLOGIC CHANGES. SEE NOTE - NEGATIVE  FOR ACUTE INFLAMMATION, FEATURES OF CHRONICITY, GRANULOMAS OR DYSPLASIA   Labs:     Latest Ref Rng & Units 09/23/2021    1:21 PM 07/06/2021    2:35 PM 03/04/2021   11:11 AM  CBC  WBC 3.4 - 10.8 x10E3/uL 8.5  4.9  5.8   Hemoglobin 11.1 - 15.9 g/dL 12.9  12.7  13.5   Hematocrit 34.0 - 46.6 % 38.7  38.1  40.8   Platelets 150 - 450 x10E3/uL 303  315.0  283.0        Latest Ref Rng & Units 09/23/2021    1:21 PM 07/06/2021    2:35  PM 03/04/2021   11:11 AM  Hepatic Function  Total Protein 6.0 - 8.5 g/dL 6.3  6.6  6.7   Albumin 3.8 - 4.8 g/dL 4.5  4.3  4.4   AST 0 - 40 IU/L 20  42  21   ALT 0 - 32 IU/L 24  70  26   Alk Phosphatase 44 - 121 IU/L 63  90  65   Total Bilirubin 0.0 - 1.2 mg/dL <0.2  0.3  0.3      Past Medical History:  Diagnosis Date   Allergic bronchopulmonary aspergillosis ([place]) 2008   sees Dr Edmund Hilda pulmonology   Anemia    iron deficiency, resolved   Anxiety    Asthma    CAD (coronary artery disease)    a. LHC 6/16:  oOM1 60, pRCA 25 >> med Rx b. cath 3/19 2nd OM with 95% stenosis s/p synergy DES & anomalous RCA   CAP (community acquired pneumonia) 2016; 06/07/2016   Chronic bronchitis (H[facility])    Chronic lower back pain    Complication of anesthesia    "think I have a hard time waking up from it"   COPD (chronic obstructive pulmonary disease) ([place])    Depression    mild   Diverticulitis    Diverticulosis    GERD (gastroesophageal reflux disease)    H/O hiatal hernia    Headache    "weekly" (08/23/2017)   History of echocardiogram    Echo 6/16:  Mod LVH, EF 60-65%, no RWMA, Gr 1 DD, trivial MR, normal LA size.   Hyperglycemia 11/20/2015   Hyperlipidemia, mixed 09/11/2007   Qualifier: Diagnosis of  By: Jerold Coombe   Did not tolerate Lipitor, zocor, Lovastatin, Pravastatin, Livalo, Crestor even low dose    IBS (irritable bowel syndrome)    Maxillary sinusitis    Normal cardiac stress test 11/2011   No evidence of ischemia or infarct.   Calculated ejection fraction 72%.   Obesity    OSA (obstructive sleep apnea) 02/2012   has stopped using  cpap   Osteoarthritis    Osteoporosis    Pneumonia 11/2011   "before 2013 I hadn't had pneumonia since I was a child" (04/13/2012)   Pulmonary nodules    S/P angioplasty with stent 08/23/17 ostial 2nd OM with DES synnergy 08/24/2017   Schatzki's ring     Past Surgical History:  Procedure Laterality Date   APPENDECTOMY  1989   CARDIAC  CATHETERIZATION N/A 11/25/2014   Procedure: Right/Left Heart Cath and Coronary Angiography;  Surgeon: Belva Crome, MD;  Location: [place] [facility];  Service: Cardiovascular;  Laterality: N/A;   [place] WITH STENT PLACEMENT  08/23/2017   CORONARY STENT INTERVENTION N/A 08/23/2017   Procedure: CORONARY STENT INTERVENTION;  Surgeon: Burnell Blanks, MD;  Location: [facility]  INVASIVE CV LAB;  Service: Cardiovascular;  Laterality: N/A;   HERNIA REPAIR  04/13/2012   VHR laparoscopic   LEFT HEART CATH AND CORONARY ANGIOGRAPHY N/A 08/23/2017   Procedure: LEFT HEART CATH AND CORONARY ANGIOGRAPHY;  Surgeon: Burnell Blanks, MD;  Location: [place] [facility];  Service: Cardiovascular;  Laterality: N/A;   VENTRAL HERNIA REPAIR  04/13/2012   Procedure: LAPAROSCOPIC VENTRAL HERNIA;  Surgeon: Adin Hector, MD;  Location: [facility];  Service: General;  Laterality: N/A;  laparoscopic repair of incarcerated hernia    Current Medications, Allergies, Family History and Social History were reviewed in [employer] record.     Current Outpatient Medications  Medication Sig Dispense Refill   albuterol (VENTOLIN HFA) 108 (90 Base) MCG/ACT inhaler Inhale 1-2 puffs into the lungs every 6 (six) hours as needed. 8 g 2   aspirin 81 MG tablet Take 81 mg by mouth daily.     Benralizumab (FASENRA PEN) 30 MG/ML SOAJ Inject 1 mL (30 mg total) into the skin every 8 (eight) weeks. 1 mL 2   Bioflavonoid Products (ESTER C PO) Take 500 mg by mouth daily.     Calcium-Magnesium-Vitamin D (CALCIUM MAGNESIUM PO) Take 1 tablet by mouth daily.     diphenoxylate-atropine (LOMOTIL) 2.5-0.025 MG tablet Take 1 tablet by mouth 4 (four) times daily as needed for diarrhea or loose stools. 40 tablet 0   DULoxetine (CYMBALTA) 30 MG capsule TAKE 3 CAPSULES (90 MG TOTAL) BY MOUTH DAILY. 90 capsule 5   Evolocumab (REPATHA SURECLICK) 875 MG/ML SOAJ INJECT 1 PEN INTO THE SKIN EVERY  14 DAYS 6 mL 3   famotidine (PEPCID) 20 MG tablet TAKE 1 TABLET BY MOUTH TWICE A DAY 60 tablet 2   HYDROcodone-acetaminophen (NORCO/VICODIN) 5-325 MG tablet Take 1 tablet by mouth every 8 (eight) hours as needed. 12 tablet 0   ipratropium-albuterol (DUONEB) 0.5-2.5 (3) MG/3ML SOLN USE 1 VIAL BY NEBULIZATION EVERY 6 (SIX) HOURS AS NEEDED. 360 mL 5   LORazepam (ATIVAN) 1 MG tablet TAKE 1/2 TO 1 TABLET BY MOUTH 2 TIMES A DAY AS NEEDED FOR ANXIETY OR SLEEP 30 tablet 2   meloxicam (MOBIC) 7.5 MG tablet TAKE 1 TO 2 TABLETS BY MOUTH DAILY 60 tablet 0   nitroGLYCERIN (NITROSTAT) 0.4 MG SL tablet PLACE 1 TABLET UNDER THE TONGUE EVERY 5 MINUTES AS NEEDED FOR CHEST PAIN. 25 tablet 4   PROLIA 60 MG/ML SOSY injection SMARTSIG:60 Milligram(s) SUB-Q Once     VITAMIN D PO Take 5,000 Units by mouth daily.     ZINC CITRATE PO Take 30 mg by mouth daily.     No current facility-administered medications for this visit.    Review of Systems: No chest pain. No shortness of breath. No urinary complaints.    Physical Exam  Wt Readings from Last 3 Encounters:  12/06/21 149 lb (67.6 kg)  11/16/21 151 lb (68.5 kg)  10/05/21 151 lb 9.6 oz (68.8 kg)    BP 134/76   Pulse 89   Ht _0  (1.549 m)   Wt 149 lb (67.6 kg)   BMI 28.15 kg/m  Constitutional:  Generally well appearing female in no acute distress. Psychiatric: Pleasant. Normal mood and affect. Behavior is normal. EENT: Pupils normal.  Conjunctivae are normal. No scleral icterus. Neck supple.  Cardiovascular: Normal rate, regular rhythm. No edema Pulmonary/chest: Effort normal and breath sounds normal. No wheezing, rales or rhonchi. Abdominal: Soft, nondistended, nontender. Bowel sounds active throughout. There are no masses  palpable. No hepatomegaly. Neurological: Alert and oriented to person place and time. Skin: Skin is warm and dry. No rashes noted.  Tye Savoy, NP  12/06/2021, 2:16 PM

## 2021-12-06 NOTE — Patient Instructions (Addendum)
If you are age 65 or younger, your body mass index should be between 19-25. Your Body mass index is 28.15 kg/m. If this is out of the aformentioned range listed, please consider follow up with your Primary Care Provider.  ________________________________________________________  The [facility] providers would like to encourage you to use [facility] to communicate with providers for non-urgent requests or questions.  Due to long hold times on the telephone, sending your provider a message by [facility] may be a faster and more efficient way to get a response.  Please allow 48 business hours for a response.  Please remember that this is for non-urgent requests.  _______________________________________________________  Continue Imodium 1-2 tablets daily as needed.  Stop stool softener  You have been scheduled to follow up with Dr. Hilarie Fredrickson on February 15, 2022 at 1:30 pm.  Thank you for entrusting me with your care and choosing [facility].  Tye Savoy, NP-C

## 2021-12-08 DIAGNOSIS — M4124 Other idiopathic scoliosis, thoracic region: Secondary | ICD-10-CM | POA: Diagnosis not present

## 2021-12-08 DIAGNOSIS — M9902 Segmental and somatic dysfunction of thoracic region: Secondary | ICD-10-CM | POA: Diagnosis not present

## 2021-12-08 DIAGNOSIS — M4004 Postural kyphosis, thoracic region: Secondary | ICD-10-CM | POA: Diagnosis not present

## 2021-12-08 DIAGNOSIS — M9901 Segmental and somatic dysfunction of cervical region: Secondary | ICD-10-CM | POA: Diagnosis not present

## 2021-12-08 LAB — FECAL LACTOFERRIN, QUANT
Fecal Lactoferrin: POSITIVE — AB
MICRO NUMBER:: 13542549
SPECIMEN QUALITY:: ADEQUATE

## 2021-12-14 ENCOUNTER — Other Ambulatory Visit: Payer: Self-pay

## 2021-12-14 DIAGNOSIS — M9901 Segmental and somatic dysfunction of cervical region: Secondary | ICD-10-CM | POA: Diagnosis not present

## 2021-12-14 DIAGNOSIS — M4124 Other idiopathic scoliosis, thoracic region: Secondary | ICD-10-CM | POA: Diagnosis not present

## 2021-12-14 DIAGNOSIS — M9902 Segmental and somatic dysfunction of thoracic region: Secondary | ICD-10-CM | POA: Diagnosis not present

## 2021-12-14 DIAGNOSIS — M4004 Postural kyphosis, thoracic region: Secondary | ICD-10-CM | POA: Diagnosis not present

## 2021-12-16 ENCOUNTER — Other Ambulatory Visit: Payer: Self-pay

## 2021-12-16 MED ORDER — MESALAMINE 1.2 G PO TBEC: 2.4000 g | DELAYED_RELEASE_TABLET | Freq: Every day | ORAL | 6 refills | Status: DC

## 2021-12-16 NOTE — Telephone Encounter (Signed)
Spoke with pt and gave pt Paula's message. Pt verbalized understanding. Prescription sent to pt's preferred pharmacy.

## 2021-12-23 DIAGNOSIS — M4004 Postural kyphosis, thoracic region: Secondary | ICD-10-CM | POA: Diagnosis not present

## 2021-12-23 DIAGNOSIS — M9901 Segmental and somatic dysfunction of cervical region: Secondary | ICD-10-CM | POA: Diagnosis not present

## 2021-12-23 DIAGNOSIS — M4124 Other idiopathic scoliosis, thoracic region: Secondary | ICD-10-CM | POA: Diagnosis not present

## 2021-12-23 DIAGNOSIS — M9902 Segmental and somatic dysfunction of thoracic region: Secondary | ICD-10-CM | POA: Diagnosis not present

## 2021-12-30 DIAGNOSIS — M9901 Segmental and somatic dysfunction of cervical region: Secondary | ICD-10-CM | POA: Diagnosis not present

## 2021-12-30 DIAGNOSIS — M4124 Other idiopathic scoliosis, thoracic region: Secondary | ICD-10-CM | POA: Diagnosis not present

## 2021-12-30 DIAGNOSIS — M4004 Postural kyphosis, thoracic region: Secondary | ICD-10-CM | POA: Diagnosis not present

## 2021-12-30 DIAGNOSIS — M9902 Segmental and somatic dysfunction of thoracic region: Secondary | ICD-10-CM | POA: Diagnosis not present

## 2022-01-05 ENCOUNTER — Ambulatory Visit (HOSPITAL_BASED_OUTPATIENT_CLINIC_OR_DEPARTMENT_OTHER)
Admission: RE | Admit: 2022-01-05 | Discharge: 2022-01-05 | Disposition: A | Discharge status: Home / Self Care | Payer: PPO | Source: Ambulatory Visit | Attending: Family Medicine | Admitting: Family Medicine

## 2022-01-05 ENCOUNTER — Encounter: Payer: Self-pay | Admitting: Family Medicine

## 2022-01-05 ENCOUNTER — Ambulatory Visit: Payer: Self-pay

## 2022-01-05 ENCOUNTER — Ambulatory Visit: Payer: PPO | Admitting: Family Medicine

## 2022-01-05 VITALS — BP 110/72 | Ht 61.0 in | Wt 149.0 lb

## 2022-01-05 DIAGNOSIS — M170 Bilateral primary osteoarthritis of knee: Secondary | ICD-10-CM | POA: Insufficient documentation

## 2022-01-05 DIAGNOSIS — M5416 Radiculopathy, lumbar region: Secondary | ICD-10-CM | POA: Diagnosis not present

## 2022-01-05 DIAGNOSIS — M1711 Unilateral primary osteoarthritis, right knee: Secondary | ICD-10-CM | POA: Diagnosis not present

## 2022-01-05 DIAGNOSIS — M810 Age-related osteoporosis without current pathological fracture: Secondary | ICD-10-CM

## 2022-01-05 DIAGNOSIS — M25562 Pain in left knee: Secondary | ICD-10-CM | POA: Diagnosis not present

## 2022-01-05 MED ORDER — CELECOXIB 200 MG PO CAPS: ORAL_CAPSULE | ORAL | 2 refills | Status: DC

## 2022-01-05 MED ORDER — TRIAMCINOLONE ACETONIDE 40 MG/ML IJ SUSP
40.0000 mg | Freq: Once | INTRAMUSCULAR | Status: AC
  Administered 2022-01-05: 40 mg via INTRA_ARTICULAR

## 2022-01-05 NOTE — Assessment & Plan Note (Signed)
Most recent bone density showing a T score -2.6.  Continue the Prolia.

## 2022-01-05 NOTE — Patient Instructions (Signed)
Good to see you Please use ice as needed  We'll start celebrex and stop mobic  I will call with the xray results.   Please send me a message in MyChart with any questions or updates.  Please see me back in 3 months.   --Dr. Raeford Razor

## 2022-01-05 NOTE — Assessment & Plan Note (Signed)
Has been doing well since the epidural. -Counseled on home exercise therapy and supportive care. -Stop Mobic and start Celebrex. -Can repeat epidural if needed.

## 2022-01-05 NOTE — Assessment & Plan Note (Signed)
Acute on chronic in nature.  Having degenerative changes but also having a patellofemoral component with her imbalance in her hips and her back. -Counseled on home exercise therapy and supportive care. -Bilateral injections. -Bilateral knee x-rays. -Could consider Zilretta going forward.

## 2022-01-05 NOTE — Progress Notes (Signed)
Victoria [name] - 66 y.o. female MRN [id number]  Date of birth: April 15, 1956  SUBJECTIVE:  Including CC & ROS.  No chief complaint on file.   Victoria [name] is a 66 y.o. female that is presenting with acute on chronic knee pain.  The pain is becoming severe in nature.  No inciting event or injury.  No history of surgery.  The pain is worse with walking.   Review of Systems See HPI   HISTORY: Past Medical, Surgical, Social, and Family History Reviewed & Updated per EMR.   Pertinent Historical Findings include:  Past Medical History:  Diagnosis Date   Allergic bronchopulmonary aspergillosis ([place]) 2008   sees Dr Edmund Hilda pulmonology   Anemia    iron deficiency, resolved   Anxiety    Asthma    CAD (coronary artery disease)    a. LHC 6/16:  oOM1 60, pRCA 25 >> med Rx b. cath 3/19 2nd OM with 95% stenosis s/p synergy DES & anomalous RCA   CAP (community acquired pneumonia) 2016; 06/07/2016   Chronic bronchitis (HCC)    Chronic lower back pain    Complication of anesthesia    "think I have a hard time waking up from it"   COPD (chronic obstructive pulmonary disease) ([place])    Depression    mild   Diverticulitis    Diverticulosis    GERD (gastroesophageal reflux disease)    H/O hiatal hernia    Headache    "weekly" (08/23/2017)   History of echocardiogram    Echo 6/16:  Mod LVH, EF 60-65%, no RWMA, Gr 1 DD, trivial MR, normal LA size.   Hyperglycemia 11/20/2015   Hyperlipidemia, mixed 09/11/2007   Qualifier: Diagnosis of  By: Jerold Coombe   Did not tolerate Lipitor, zocor, Lovastatin, Pravastatin, Livalo, Crestor even low dose    IBS (irritable bowel syndrome)    Maxillary sinusitis    Normal cardiac stress test 11/2011   No evidence of ischemia or infarct.   Calculated ejection fraction 72%.   Obesity    OSA (obstructive sleep apnea) 02/2012   has stopped using  cpap   Osteoarthritis    Osteoporosis    Pneumonia 11/2011   "before 2013 I hadn't had pneumonia since I  was a child" (04/13/2012)   Pulmonary nodules    S/P angioplasty with stent 08/23/17 ostial 2nd OM with DES synnergy 08/24/2017   Schatzki's ring     Past Surgical History:  Procedure Laterality Date   APPENDECTOMY  1989   CARDIAC CATHETERIZATION N/A 11/25/2014   Procedure: Right/Left Heart Cath and Coronary Angiography;  Surgeon: Belva Crome, MD;  Location: [place] [facility];  Service: Cardiovascular;  Laterality: N/A;   [facility] WITH STENT PLACEMENT  08/23/2017   CORONARY STENT INTERVENTION N/A 08/23/2017   Procedure: CORONARY STENT INTERVENTION;  Surgeon: Burnell Blanks, MD;  Location: [place] [facility];  Service: Cardiovascular;  Laterality: N/A;   HERNIA REPAIR  04/13/2012   VHR laparoscopic   LEFT HEART CATH AND CORONARY ANGIOGRAPHY N/A 08/23/2017   Procedure: LEFT HEART CATH AND CORONARY ANGIOGRAPHY;  Surgeon: Burnell Blanks, MD;  Location: [place] [facility];  Service: Cardiovascular;  Laterality: N/A;   VENTRAL HERNIA REPAIR  04/13/2012   Procedure: LAPAROSCOPIC VENTRAL HERNIA;  Surgeon: Adin Hector, MD;  Location: [facility];  Service: General;  Laterality: N/A;  laparoscopic repair of incarcerated hernia     PHYSICAL EXAM:  VS: BP 110/72 (BP Location: Left Arm, Patient Position: Sitting)   Ht '5\' 1"'$  (1.549 m)   Wt 149 lb (67.6 kg)   BMI 28.15 kg/m  Physical Exam Gen: NAD, alert, cooperative with exam, well-appearing MSK:  Neurovascularly intact     Aspiration/Injection Procedure Note Victoria [name] 01/22/1956  Procedure: Injection Indications: Right and left knee pain  Procedure Details Consent: Risks of procedure as well as the alternatives and risks of each were explained to the (patient/caregiver).  Consent for procedure obtained. Time Out: Verified patient identification, verified procedure, site/side was marked, verified correct patient position, special equipment/implants available,  medications/allergies/relevent history reviewed, required imaging and test results available.  Performed.  The area was cleaned with iodine and alcohol swabs.    The right and left knee superior lateral suprapatellar pouch was injected using 3 cc of 1% lidocaine on a 25-gauge 1-1/2 inch needle.  The syringe was switched and a mixture containing 1 cc's of 40 mg Kenalog and 4 cc's of 0.25% bupivacaine was injected.  Ultrasound was used. Images were obtained in long views showing the injection.     A sterile dressing was applied.  Patient did tolerate procedure well.     ASSESSMENT & PLAN:   OA (osteoarthritis) of knee Acute on chronic in nature.  Having degenerative changes but also having a patellofemoral component with her imbalance in her hips and her back. -Counseled on home exercise therapy and supportive care. -Bilateral injections. -Bilateral knee x-rays. -Could consider Zilretta going forward.  Osteoporosis Most recent bone density showing a T score -2.6.  Continue the Prolia.  Lumbar radiculopathy Has been doing well since the epidural. -Counseled on home exercise therapy and supportive care. -Stop Mobic and start Celebrex. -Can repeat epidural if needed.

## 2022-01-06 ENCOUNTER — Telehealth: Payer: Self-pay | Admitting: Family Medicine

## 2022-01-06 ENCOUNTER — Other Ambulatory Visit: Payer: Self-pay | Admitting: Pharmacist

## 2022-01-06 DIAGNOSIS — M9901 Segmental and somatic dysfunction of cervical region: Secondary | ICD-10-CM | POA: Diagnosis not present

## 2022-01-06 DIAGNOSIS — M9902 Segmental and somatic dysfunction of thoracic region: Secondary | ICD-10-CM | POA: Diagnosis not present

## 2022-01-06 DIAGNOSIS — M4124 Other idiopathic scoliosis, thoracic region: Secondary | ICD-10-CM | POA: Diagnosis not present

## 2022-01-06 DIAGNOSIS — M4004 Postural kyphosis, thoracic region: Secondary | ICD-10-CM | POA: Diagnosis not present

## 2022-01-06 MED ORDER — REPATHA SURECLICK 140 MG/ML ~~LOC~~ SOAJ: SUBCUTANEOUS | 3 refills | Status: DC

## 2022-01-06 NOTE — Telephone Encounter (Signed)
Informed of results  Rosemarie Ax, MD [facility] 01/06/2022, 8:18 AM

## 2022-01-13 ENCOUNTER — Encounter: Payer: Self-pay | Admitting: Family Medicine

## 2022-01-13 DIAGNOSIS — M9902 Segmental and somatic dysfunction of thoracic region: Secondary | ICD-10-CM | POA: Diagnosis not present

## 2022-01-13 DIAGNOSIS — M4004 Postural kyphosis, thoracic region: Secondary | ICD-10-CM | POA: Diagnosis not present

## 2022-01-13 DIAGNOSIS — M4124 Other idiopathic scoliosis, thoracic region: Secondary | ICD-10-CM | POA: Diagnosis not present

## 2022-01-13 DIAGNOSIS — M9901 Segmental and somatic dysfunction of cervical region: Secondary | ICD-10-CM | POA: Diagnosis not present

## 2022-01-13 NOTE — Progress Notes (Signed)
Office Visit Note  Patient: Victoria [name]             Date of Birth: Jan 11, 1956           MRN: [id number]             PCP: Mosie Lukes, MD Referring: Elise Benne Visit Date: 01/25/2022 Occupation: @GUAROCC @  Subjective:  Pain in multiple joints  History of Present Illness: Victoria [name] is a 66 y.o. female seen in consultation per request of her PCP.  According the patient in her 62s she was seen by rheumatologist in [place] at the time she was having back pain and joint pain.  She was given Celebrex for few years.  She was also diagnosed with osteoporosis at age 58 as she has taken steroids for asthma for many years.  She recalls being on Fosamax for several years.  She moved to [facility] in 2004 and stayed on Fosamax for a while and then Fosamax was discontinued once her bone density became normal.  She states she has had lower back pain for at least 35 years.  She has been to a chiropractor in the past and also had been going to pain management.  She states every time she took prednisone for asthma it helped her joint symptoms.  She also describes pain and discomfort in her bilateral elbows, wrist, hands, left trochanteric bursa, knee joints and her ankles.  She has not noticed any joint swelling.  She has had cortisone injection to the left trochanteric bursa by Dr. Percell Miller in February 2023 and June 2023 which gave her relief.  She has been also under care of Dr. Raeford Razor.  She has had SI joint injection and left trochanteric injection and bilateral knee injections by Dr. Raeford Razor.  She states the knee injections were on January 14, 2022.  She also had T12 epidural injection by Dr. Maree Erie in May 2023.  There is no family history of autoimmune disease.  She gives history of insomnia due to restless leg syndrome.  She takes lorazepam for insomnia and restless leg syndrome.  She is gravida 1, para 1, miscarriages 0.  There is no history of blood clots.  Activities of Daily Living:   Patient reports morning stiffness for 0 minutes.   Patient Reports nocturnal pain.  Difficulty dressing/grooming: Denies Difficulty climbing stairs: Denies Difficulty getting out of chair: Denies Difficulty using hands for taps, buttons, cutlery, and/or writing: Denies  Review of Systems  Constitutional:  Negative for fatigue.  HENT:  Negative for mouth sores and mouth dryness.   Eyes:  Positive for dryness.  Respiratory:  Negative for shortness of breath.   Cardiovascular:  Negative for chest pain and palpitations.  Gastrointestinal:  Positive for diarrhea. Negative for blood in stool and constipation.  Endocrine: Negative for increased urination.  Genitourinary:  Negative for involuntary urination.  Musculoskeletal:  Positive for joint pain, joint pain, myalgias, muscle tenderness and myalgias. Negative for joint swelling, muscle weakness and morning stiffness.  Skin:  Negative for color change, rash, hair loss and sensitivity to sunlight.  Allergic/Immunologic: Positive for susceptible to infections.  Neurological:  Positive for headaches. Negative for dizziness.  Hematological:  Negative for swollen glands.  Psychiatric/Behavioral:  Positive for sleep disturbance. Negative for depressed mood. The patient is not nervous/anxious.     PMFS History:  Patient Active Problem List   Diagnosis Date Noted   Diarrhea 11/16/2021   Lumbar radiculopathy 09/20/2021   Sacroiliac joint dysfunction of right  side 08/16/2021   Greater trochanteric pain syndrome of left lower extremity 07/29/2021   Diverticulitis 07/07/2021   Iron deficiency anemia 09/21/2020   Family history of breast cancer 09/21/2020   Preventative health care 07/01/2020   Hemorrhoids 07/01/2020   LUQ discomfort 07/01/2020   Statin intolerance 01/10/2020   Occipital pain 06/27/2019   Tinnitus of both ears 06/27/2019   Statin myopathy 06/19/2019   Other cervical disc degeneration, unspecified cervical region 06/03/2019    Headache 03/19/2019   Tinnitus 03/19/2019   Heart disease 02/21/2019   Neck pain 02/17/2019   Toe pain, right 10/09/2018   Hx of cold sores 10/09/2018   Bronchitis with asthma, acute 07/18/2018   Osteoporosis 05/23/2018   S/P angioplasty with stent 08/23/17 ostial 2nd OM with DES synnergy 08/24/2017   CAD (coronary artery disease)    Anxiety and depression 01/16/2017   Insomnia 01/16/2017   Acute recurrent sinusitis 05/27/2016   Muscle spasm 05/03/2016   Chronic pain 05/03/2016   Severe persistent asthma with intensive monitoring 03/22/2016   BMI 30.0-30.9,adult 02/18/2016   MRSA (methicillin resistant Staphylococcus aureus) colonization 01/12/2016   Sleep disorder due to stress 01/12/2016   Hyperglycemia 11/20/2015   Chronic diastolic CHF (congestive heart failure), NYHA class 2 ([place]) 06/26/2015   Coronary artery disease due to lipid rich plaque 98/26/4158   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[place]) 30/94/0768   Cardiomegaly 11/21/2014   Seasonal and perennial allergic rhinitis 10/22/2011   Fatigue 09/01/2011   Chest pain, atypical 06/01/2011   OSA (obstructive sleep apnea) 08/81/1031   UMBILICAL HERNIA 59/45/8592   DIVERTICULOSIS, COLON 06/11/2010   HEPATIC CYST 06/11/2010   Idiopathic scoliosis and kyphoscoliosis 06/11/2010   TRANSAMINASES, SERUM, ELEVATED 05/27/2010   Abdominal pain 01/15/2010   ELEVATED BP READING WITHOUT DX HYPERTENSION 08/03/2009   Gastroesophageal reflux disease with hiatal hernia 10/21/2008   h/o PALPITATIONS 08/19/2008   A B P A-ALLERGIC BRONCHOPULMONARY ASPERGILLOSIS 12/14/2007   Asthma 11/16/2007   Hyperlipidemia, mixed 09/11/2007   STRESS INCONTINENCE 09/11/2007   Cushing's syndrome ([place]) 12/11/2006   OA (osteoarthritis) of knee 12/11/2006    Past Medical History:  Diagnosis Date   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 LHC 6/16:  oOM1 60, pRCA 25 >> med Rx b. cath 3/19 2nd OM with 95% stenosis s/p synergy DES & anomalous RCA   CAP (community acquired pneumonia) 2016; 06/07/2016   Chronic bronchitis (HCC)    Chronic lower back pain    Complication of anesthesia    "think I have a hard time waking up from it"   COPD (chronic obstructive pulmonary disease) ([place])    Depression    mild   Diverticulitis    Diverticulosis    GERD (gastroesophageal reflux disease)    H/O hiatal hernia    Headache    "weekly" (08/23/2017)   History of echocardiogram    Echo 6/16:  Mod LVH, EF 60-65%, no RWMA, Gr 1 DD, trivial MR, normal LA size.   Hyperglycemia 11/20/2015   Hyperlipidemia, mixed 09/11/2007   Qualifier: Diagnosis of  By: Jerold Coombe   Did not tolerate Lipitor, zocor, Lovastatin, Pravastatin, Livalo, Crestor even low dose    IBS (irritable bowel syndrome)    Maxillary sinusitis    Normal cardiac stress test 11/2011   No evidence of ischemia or infarct.  Calculated ejection fraction 72%.   Obesity    OSA (obstructive sleep apnea) 02/2012   has stopped using  cpap   Osteoarthritis    Osteoporosis    Pneumonia 11/2011   "before 2013 I hadn't had pneumonia since I was a child" (04/13/2012)   Pulmonary nodules    S/P angioplasty with stent 08/23/17 ostial 2nd OM with DES synnergy 08/24/2017   Schatzki's ring     Family History  Problem Relation Age of Onset   Breast cancer Mother    Hypertension Mother    Diabetes Mother    Cancer Mother        recurrent, metastatic breast cancer.    Diverticulosis Father    Prostate cancer Father    Breast cancer Sister    Cancer Sister        breast cancer, invasive ductal carcinoma in 2022,DCIS at 57 with 4 weeks of radiation, 5 years of Tamoxifen    Pulmonary embolism Brother        recurrent   Heart attack Maternal Grandfather    Cerebral palsy Son    Cancer Niece 66   Osteoporosis Niece    Stroke Neg Hx    Colon cancer Neg Hx     Esophageal cancer Neg Hx    Stomach cancer Neg Hx    Rectal cancer Neg Hx    Past Surgical History:  Procedure Laterality Date   APPENDECTOMY  1989   CARDIAC CATHETERIZATION N/A 11/25/2014   Procedure: Right/Left Heart Cath and Coronary Angiography;  Surgeon: Belva Crome, MD;  Location: [facility];  Service: Cardiovascular;  Laterality: N/A;   [place] WITH STENT PLACEMENT  08/23/2017   CORONARY STENT INTERVENTION N/A 08/23/2017   Procedure: CORONARY STENT INTERVENTION;  Surgeon: Burnell Blanks, MD;  Location: [facility];  Service: Cardiovascular;  Laterality: N/A;   HERNIA REPAIR  04/13/2012   VHR laparoscopic   LEFT HEART CATH AND CORONARY ANGIOGRAPHY N/A 08/23/2017   Procedure: LEFT HEART CATH AND CORONARY ANGIOGRAPHY;  Surgeon: Burnell Blanks, MD;  Location: [facility];  Service: Cardiovascular;  Laterality: N/A;   VENTRAL HERNIA REPAIR  04/13/2012   Procedure: LAPAROSCOPIC VENTRAL HERNIA;  Surgeon: Adin Hector, MD;  Location: [facility];  Service: General;  Laterality: N/A;  laparoscopic repair of incarcerated hernia   Social History   Social History Narrative   Cares for son with cerebral palsy.    Lives at home with her husband and son.   Right-handed.   2 cups caffeine per day.   One story home   Immunization History  Administered Date(s) Administered   Influenza Split 04/20/2011, 04/20/2020   Influenza Whole 06/06/2007, 04/15/2008, 04/02/2009, 03/29/2012   Influenza, High Dose Seasonal PF 05/05/2015   Influenza,inj,Quad PF,6+ Mos 05/09/2013, 03/03/2014, 05/03/2016, 04/21/2017, 04/02/2018, 03/19/2019, 04/20/2020, 05/03/2021   Moderna Sars-Covid-2 Vaccination 08/15/2019, 09/17/2019, 05/16/2020, 12/04/2020   Pfizer Covid-19 Vaccine Bivalent Booster 83yr & up 04/16/2021   Pneumococcal Conjugate-13 05/09/2013   Pneumococcal Polysaccharide-23 05/04/2005   Td 07/29/2009   Tdap 03/11/2015   Zoster  Recombinat (Shingrix) 05/09/2019, 08/01/2019     Objective: Vital Signs: BP 122/79 (BP Location: Right Arm, Patient Position: Sitting, Cuff Size: Normal)   Pulse 81   Resp 16   Ht 5' 1"  (1.549 m)   Wt 152 lb 3.2 oz (69 kg)   BMI 28.76 kg/m    Physical Exam Vitals and nursing note reviewed.  Constitutional:  Appearance: She is well-developed.  HENT:     Head: Normocephalic and atraumatic.  Eyes:     Conjunctiva/sclera: Conjunctivae normal.  Cardiovascular:     Rate and Rhythm: Normal rate and regular rhythm.     Heart sounds: Normal heart sounds.  Pulmonary:     Effort: Pulmonary effort is normal.     Breath sounds: Normal breath sounds.  Abdominal:     General: Bowel sounds are normal.     Palpations: Abdomen is soft.  Musculoskeletal:     Cervical back: Normal range of motion.  Lymphadenopathy:     Cervical: No cervical adenopathy.  Skin:    General: Skin is warm and dry.     Capillary Refill: Capillary refill takes less than 2 seconds.  Neurological:     Mental Status: She is alert and oriented to person, place, and time.  Psychiatric:        Behavior: Behavior normal.      Musculoskeletal Exam: She had limited lateral rotation of the cervical spine.  She had thoracic kyphosis and thoracolumbar scoliosis.  He had painful range of motion of the thoracic and lumbar spine.  Shoulder joints, elbow joints, wrist joints, MCPs PIPs and DIPs were in good range of motion with no synovitis.  Hip joints and knee joints were in good range of motion.  There was no tenderness over ankles or MTPs.  CDAI Exam: CDAI Score: -- Patient Global: --; Provider Global: -- Swollen: --; Tender: -- Joint Exam 01/25/2022   No joint exam has been documented for this visit   There is currently no information documented on the homunculus. Go to the Rheumatology activity and complete the homunculus joint exam.  Investigation: No additional findings.  Imaging: DG Knee Complete 4 Views  Left  Result Date: 01/06/2022 CLINICAL DATA:  Left knee pain EXAM: LEFT KNEE - COMPLETE 4+ VIEW COMPARISON:  None Available. FINDINGS: No evidence of fracture, dislocation, or joint effusion. No evidence of arthropathy or other focal bone abnormality. Soft tissues are unremarkable. IMPRESSION: Negative. Electronically Signed   By: Fidela Salisbury M.D.   On: 01/06/2022 04:10   DG Knee Complete 4 Views Right  Result Date: 01/06/2022 CLINICAL DATA:  Knee pain EXAM: RIGHT KNEE - COMPLETE 4+ VIEW COMPARISON:  None Available. FINDINGS: Normal alignment. No acute fracture or dislocation. Mild lateral compartment degenerative arthritis with joint space narrowing and osteophyte formation. Remaining joint spaces appear preserved. No effusion. Soft tissues are unremarkable. IMPRESSION: Mild lateral compartment degenerative arthritis. Electronically Signed   By: Fidela Salisbury M.D.   On: 01/06/2022 04:09    Recent Labs: Lab Results  Component Value Date   WBC 8.5 09/23/2021   HGB 12.9 09/23/2021   PLT 303 09/23/2021   NA 135 09/23/2021   K 4.2 09/23/2021   CL 99 09/23/2021   CO2 21 09/23/2021   GLUCOSE 90 09/23/2021   BUN 21 09/23/2021   CREATININE 0.57 09/23/2021   BILITOT <0.2 09/23/2021   ALKPHOS 63 09/23/2021   AST 20 09/23/2021   ALT 24 09/23/2021   PROT 6.3 09/23/2021   ALBUMIN 4.5 09/23/2021   CALCIUM 9.2 09/23/2021   GFRAA >60 08/24/2017    Speciality Comments: No specialty comments available.  Procedures:  No procedures performed Allergies: Beclomethasone dipropionate, Flexeril [cyclobenzaprine], Mometasone furo-formoterol fum, Sulfonamide derivatives, and Statins   Assessment / Plan:     Visit Diagnoses: Positive ANA (antinuclear antibody) - 09/03/21: ANA 1:80 nuclear, dense fine speckled, 1:320 nuclear, few nuclear dots, ESR  6, uric acid 3.0, CRP 1.2, RF<14 -she had recent workup for polyarthralgia.  Her labs showed positive ANA.  She denies any history of oral ulcers, nasal  ulcers, sicca symptoms, malar rash, photosensitivity, lymphadenopathy or inflammatory arthritis.  She states she had symptoms of Raynaud's phenomenon.  She was living in [place].  She has been living in [place] for the last 19 years and did not have any problems.  She has history of pain in multiple joints since she was in her 59s.  No synovitis was noted on the examination.  I will obtain additional labs to complete autoimmune workup.  Plan: Protein / creatinine ratio, urine, Sedimentation rate, ANA, Anti-scleroderma antibody, RNP Antibody, Anti-Smith antibody, Sjogrens syndrome-A extractable nuclear antibody, Sjogrens syndrome-B extractable nuclear antibody, Anti-DNA antibody, double-stranded, C3 and C4, Beta-2 glycoprotein antibodies, Cardiolipin antibodies, IgG, IgM, IgA, Cyclic citrul peptide antibody, IgG  Polyarthralgia-she complains of pain in multiple joints involving her elbows, wrists, hands, left hip, knees, entire spine.  She had x-rays of her left hand in September 2020 which were unremarkable.  No synovitis or tenderness was noted over the elbow joints.  She was diagnosed with left trochanteric bursitis by Dr. Raeford Razor and Dr. Percell Miller.  She had good response to cortisone injection.  Trochanteric bursitis, left hip-she had good response to left trochanteric bursa injection.  Chronic pain of both knees -she complains of pain and discomfort in her knee joints.  I reviewed x-rays of her knee joints from January 06, 2022.  X-rays of the right knee joint showed mild lateral compartment narrowing and the right knee joint x-rays were unremarkable as read by the radiologist.  DDD (degenerative disc disease), thoracic-she has multilevel spondylosis and scoliosis of the thoracic spine with chronic discomfort.  She had thoracic spine epidural injection recently per patient.  DDD (degenerative disc disease), lumbar-she has severe scoliosis and multilevel spondylosis with facet joint arthropathy.  She  continues to have lower back pain.  Idiopathic scoliosis and kyphoscoliosis-she has chronic discomfort.  Sacroiliac joint dysfunction of right side-she has intermittent SI joint pain secondary to disc disease of the lumbar spine.  Statin myopathy -patient states that she could not take statins in the past due to muscle pain.  - Plan: CK  Age-related osteoporosis without current pathological fracture-according the patient she was diagnosed with osteoporosis when she was in her 30s due to chronic use of steroids for asthma.  She was treated for osteoporosis with Fosamax for many years which was discontinued as her DEXA scan became normal.  She was again diagnosed with osteoporosis in 2020.  She states she was placed on Fosamax and was later restarted on Prolia.  She started Prolia in May 2022.  She has had 3 injections so far.  She takes calcium and vitamin D.  Need for regular exercise was emphasized.  She has been walking for exercise.  Other medical problems are listed as follows:  Chronic diastolic CHF (congestive heart failure), NYHA class 2 (HCC)  Cardiomegaly  Coronary artery disease involving native coronary artery of native heart with unstable angina pectoris (HCC)  S/P angioplasty with stent 08/23/17 ostial 2nd OM with DES synnergy  Hyperlipidemia, mixed  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HCC)  A B P A-ALLERGIC BRONCHOPULMONARY ASPERGILLOSIS  Seasonal and perennial allergic rhinitis  OSA (obstructive sleep apnea) - She could not use CPAP in the past.  Restless leg syndrome-patient reports insomnia due to restless leg syndrome.  Gastroesophageal reflux disease with hiatal hernia  Hepatic cyst  History of diverticulosis  Cushing's syndrome ([place]) - ttd with hydrocortisone in the past. She had steroida all her life due to asthma.  Currently not followed by an endocrinologist.  MRSA (methicillin resistant Staphylococcus aureus) colonization - several years ago  Anxiety and  depression-she is on Cymbalta.  Family history of breast cancer-mother, sister  Orders: Orders Placed This Encounter  Procedures   Protein / creatinine ratio, urine   Sedimentation rate   ANA   Anti-scleroderma antibody   RNP Antibody   Anti-Smith antibody   Sjogrens syndrome-A extractable nuclear antibody   Sjogrens syndrome-B extractable nuclear antibody   Anti-DNA antibody, double-stranded   C3 and C4   Beta-2 glycoprotein antibodies   Cardiolipin antibodies, IgG, IgM, IgA   Cyclic citrul peptide antibody, IgG   CK   No orders of the defined types were placed in this encounter.    Follow-Up Instructions: Return for Polyarthralgia.   Bo Merino, MD  Note - This record has been created using Editor, commissioning.  Chart creation errors have been sought, but may not always  have been located. Such creation errors do not reflect on  the standard of medical care.

## 2022-01-20 DIAGNOSIS — M9901 Segmental and somatic dysfunction of cervical region: Secondary | ICD-10-CM | POA: Diagnosis not present

## 2022-01-20 DIAGNOSIS — M4124 Other idiopathic scoliosis, thoracic region: Secondary | ICD-10-CM | POA: Diagnosis not present

## 2022-01-20 DIAGNOSIS — M4004 Postural kyphosis, thoracic region: Secondary | ICD-10-CM | POA: Diagnosis not present

## 2022-01-20 DIAGNOSIS — M9902 Segmental and somatic dysfunction of thoracic region: Secondary | ICD-10-CM | POA: Diagnosis not present

## 2022-01-25 ENCOUNTER — Encounter: Payer: Self-pay | Admitting: Rheumatology

## 2022-01-25 ENCOUNTER — Ambulatory Visit: Payer: PPO | Attending: Rheumatology | Admitting: Rheumatology

## 2022-01-25 VITALS — BP 122/79 | HR 81 | Resp 16 | Ht 61.0 in | Wt 152.2 lb

## 2022-01-25 DIAGNOSIS — M810 Age-related osteoporosis without current pathological fracture: Secondary | ICD-10-CM

## 2022-01-25 DIAGNOSIS — J3089 Other allergic rhinitis: Secondary | ICD-10-CM

## 2022-01-25 DIAGNOSIS — M5136 Other intervertebral disc degeneration, lumbar region: Secondary | ICD-10-CM | POA: Diagnosis not present

## 2022-01-25 DIAGNOSIS — B4481 Allergic bronchopulmonary aspergillosis: Secondary | ICD-10-CM

## 2022-01-25 DIAGNOSIS — Z95820 Peripheral vascular angioplasty status with implants and grafts: Secondary | ICD-10-CM

## 2022-01-25 DIAGNOSIS — K7689 Other specified diseases of liver: Secondary | ICD-10-CM

## 2022-01-25 DIAGNOSIS — F32A Depression, unspecified: Secondary | ICD-10-CM

## 2022-01-25 DIAGNOSIS — I5032 Chronic diastolic (congestive) heart failure: Secondary | ICD-10-CM

## 2022-01-25 DIAGNOSIS — M4120 Other idiopathic scoliosis, site unspecified: Secondary | ICD-10-CM | POA: Diagnosis not present

## 2022-01-25 DIAGNOSIS — E782 Mixed hyperlipidemia: Secondary | ICD-10-CM

## 2022-01-25 DIAGNOSIS — J8283 Eosinophilic asthma: Secondary | ICD-10-CM

## 2022-01-25 DIAGNOSIS — Z803 Family history of malignant neoplasm of breast: Secondary | ICD-10-CM

## 2022-01-25 DIAGNOSIS — G4733 Obstructive sleep apnea (adult) (pediatric): Secondary | ICD-10-CM

## 2022-01-25 DIAGNOSIS — M25561 Pain in right knee: Secondary | ICD-10-CM | POA: Diagnosis not present

## 2022-01-25 DIAGNOSIS — I517 Cardiomegaly: Secondary | ICD-10-CM | POA: Diagnosis not present

## 2022-01-25 DIAGNOSIS — T466X5A Adverse effect of antihyperlipidemic and antiarteriosclerotic drugs, initial encounter: Secondary | ICD-10-CM | POA: Diagnosis not present

## 2022-01-25 DIAGNOSIS — K219 Gastro-esophageal reflux disease without esophagitis: Secondary | ICD-10-CM

## 2022-01-25 DIAGNOSIS — E249 Cushing's syndrome, unspecified: Secondary | ICD-10-CM

## 2022-01-25 DIAGNOSIS — G2581 Restless legs syndrome: Secondary | ICD-10-CM

## 2022-01-25 DIAGNOSIS — M2550 Pain in unspecified joint: Secondary | ICD-10-CM | POA: Diagnosis not present

## 2022-01-25 DIAGNOSIS — K449 Diaphragmatic hernia without obstruction or gangrene: Secondary | ICD-10-CM

## 2022-01-25 DIAGNOSIS — I25110 Atherosclerotic heart disease of native coronary artery with unstable angina pectoris: Secondary | ICD-10-CM

## 2022-01-25 DIAGNOSIS — M533 Sacrococcygeal disorders, not elsewhere classified: Secondary | ICD-10-CM

## 2022-01-25 DIAGNOSIS — Z8719 Personal history of other diseases of the digestive system: Secondary | ICD-10-CM

## 2022-01-25 DIAGNOSIS — G8929 Other chronic pain: Secondary | ICD-10-CM

## 2022-01-25 DIAGNOSIS — M7062 Trochanteric bursitis, left hip: Secondary | ICD-10-CM

## 2022-01-25 DIAGNOSIS — J4550 Severe persistent asthma, uncomplicated: Secondary | ICD-10-CM

## 2022-01-25 DIAGNOSIS — R768 Other specified abnormal immunological findings in serum: Secondary | ICD-10-CM | POA: Diagnosis not present

## 2022-01-25 DIAGNOSIS — M5134 Other intervertebral disc degeneration, thoracic region: Secondary | ICD-10-CM | POA: Diagnosis not present

## 2022-01-25 DIAGNOSIS — F419 Anxiety disorder, unspecified: Secondary | ICD-10-CM

## 2022-01-25 DIAGNOSIS — M5416 Radiculopathy, lumbar region: Secondary | ICD-10-CM

## 2022-01-25 DIAGNOSIS — Z22322 Carrier or suspected carrier of Methicillin resistant Staphylococcus aureus: Secondary | ICD-10-CM

## 2022-01-25 DIAGNOSIS — M25562 Pain in left knee: Secondary | ICD-10-CM

## 2022-01-25 DIAGNOSIS — M25552 Pain in left hip: Secondary | ICD-10-CM

## 2022-01-25 DIAGNOSIS — J302 Other seasonal allergic rhinitis: Secondary | ICD-10-CM

## 2022-01-25 DIAGNOSIS — M51369 Other intervertebral disc degeneration, lumbar region without mention of lumbar back pain or lower extremity pain: Secondary | ICD-10-CM

## 2022-01-25 DIAGNOSIS — G720 Drug-induced myopathy: Secondary | ICD-10-CM

## 2022-01-26 ENCOUNTER — Encounter: Payer: Self-pay | Admitting: Family Medicine

## 2022-01-27 NOTE — Progress Notes (Signed)
CK is mildly elevated.  I will repeat CK, aldolase and myositis panel at the follow-up visit.

## 2022-01-28 DIAGNOSIS — H15121 Nodular episcleritis, right eye: Secondary | ICD-10-CM | POA: Diagnosis not present

## 2022-01-31 LAB — ANTI-NUCLEAR AB-TITER (ANA TITER)
ANA TITER: 1:80 {titer} — ABNORMAL HIGH
ANA Titer 1: 1:80 {titer} — ABNORMAL HIGH

## 2022-01-31 LAB — PROTEIN / CREATININE RATIO, URINE
Creatinine, Urine: 105 mg/dL (ref 20–275)
Protein/Creat Ratio: 86 mg/g creat (ref 24–184)
Protein/Creatinine Ratio: 0.086 mg/mg creat (ref 0.024–0.184)
Total Protein, Urine: 9 mg/dL (ref 5–24)

## 2022-01-31 LAB — SEDIMENTATION RATE: Sed Rate: 6 mm/h (ref 0–30)

## 2022-01-31 LAB — BETA-2 GLYCOPROTEIN ANTIBODIES
Beta-2 Glyco 1 IgA: 3 U/mL (ref <20.0)
Beta-2 Glyco 1 IgM: <2 U/mL (ref <20.0)
Beta-2 Glyco I IgG: <2 U/mL (ref <20.0)

## 2022-01-31 LAB — SJOGRENS SYNDROME-A EXTRACTABLE NUCLEAR ANTIBODY: SSA (Ro) (ENA) Antibody, IgG: <1 AI

## 2022-01-31 LAB — CK: Total CK: 328 U/L — ABNORMAL HIGH (ref 29–143)

## 2022-01-31 LAB — CARDIOLIPIN ANTIBODIES, IGG, IGM, IGA
Anticardiolipin IgA: 3.9 APL-U/mL (ref <20.0)
Anticardiolipin IgG: <2 GPL-U/mL (ref <20.0)
Anticardiolipin IgM: <2 MPL-U/mL (ref <20.0)

## 2022-01-31 LAB — CYCLIC CITRUL PEPTIDE ANTIBODY, IGG: Cyclic Citrullin Peptide Ab: <16 UNITS

## 2022-01-31 LAB — ANTI-SMITH ANTIBODY: ENA SM Ab Ser-aCnc: <1 AI

## 2022-01-31 LAB — RNP ANTIBODY: Ribonucleic Protein(ENA) Antibody, IgG: <1 AI

## 2022-01-31 LAB — ANTI-SCLERODERMA ANTIBODY: Scleroderma (Scl-70) (ENA) Antibody, IgG: <1 AI

## 2022-01-31 LAB — C3 AND C4
C3 Complement: 111 mg/dL (ref 83–193)
C4 Complement: 29 mg/dL (ref 15–57)

## 2022-01-31 LAB — ANA: Anti Nuclear Antibody (ANA): POSITIVE — AB

## 2022-01-31 LAB — ANTI-DNA ANTIBODY, DOUBLE-STRANDED: ds DNA Ab: <1 IU/mL

## 2022-01-31 LAB — SJOGRENS SYNDROME-B EXTRACTABLE NUCLEAR ANTIBODY: SSB (La) (ENA) Antibody, IgG: <1 AI

## 2022-02-01 DIAGNOSIS — M4124 Other idiopathic scoliosis, thoracic region: Secondary | ICD-10-CM | POA: Diagnosis not present

## 2022-02-01 DIAGNOSIS — M9901 Segmental and somatic dysfunction of cervical region: Secondary | ICD-10-CM | POA: Diagnosis not present

## 2022-02-01 DIAGNOSIS — M9902 Segmental and somatic dysfunction of thoracic region: Secondary | ICD-10-CM | POA: Diagnosis not present

## 2022-02-01 DIAGNOSIS — M4004 Postural kyphosis, thoracic region: Secondary | ICD-10-CM | POA: Diagnosis not present

## 2022-02-04 ENCOUNTER — Other Ambulatory Visit: Payer: Self-pay

## 2022-02-04 MED ORDER — NITROGLYCERIN 0.4 MG SL SUBL: SUBLINGUAL_TABLET | SUBLINGUAL | 8 refills | Status: DC

## 2022-02-07 ENCOUNTER — Other Ambulatory Visit: Payer: Self-pay | Admitting: Family Medicine

## 2022-02-07 ENCOUNTER — Telehealth: Payer: Self-pay

## 2022-02-07 ENCOUNTER — Encounter: Payer: Self-pay | Admitting: Family Medicine

## 2022-02-07 DIAGNOSIS — J449 Chronic obstructive pulmonary disease, unspecified: Secondary | ICD-10-CM | POA: Diagnosis not present

## 2022-02-07 DIAGNOSIS — F32A Depression, unspecified: Secondary | ICD-10-CM | POA: Diagnosis not present

## 2022-02-07 DIAGNOSIS — I2510 Atherosclerotic heart disease of native coronary artery without angina pectoris: Secondary | ICD-10-CM | POA: Diagnosis not present

## 2022-02-07 MED ORDER — DULOXETINE HCL 30 MG PO CPEP: 60.0000 mg | ORAL_CAPSULE | Freq: Two times a day (BID) | ORAL | 5 refills | Status: DC

## 2022-02-07 NOTE — Telephone Encounter (Signed)
PA approved.   21-AUG-23:31-DEC-23 DULoxetine HCl 30MG OR CPEP Quantity:120;

## 2022-02-07 NOTE — Telephone Encounter (Signed)
PA initiated via Covermymeds; KEY: [id number]. Awaiting determination.

## 2022-02-11 ENCOUNTER — Other Ambulatory Visit: Payer: Self-pay | Admitting: Family Medicine

## 2022-02-11 NOTE — Telephone Encounter (Signed)
Requesting: lorazepam 48m  Contract: 07/29/2014 UDS: None Last Visit: 11/25/21 Next Visit: 03/04/22 Last Refill: 09/03/21 #30 and 2RF  Please Advise

## 2022-02-14 ENCOUNTER — Telehealth: Payer: Self-pay | Admitting: Family Medicine

## 2022-02-14 ENCOUNTER — Other Ambulatory Visit: Payer: Self-pay | Admitting: Family Medicine

## 2022-02-14 ENCOUNTER — Telehealth: Payer: Self-pay | Admitting: Internal Medicine

## 2022-02-14 ENCOUNTER — Encounter: Payer: Self-pay | Admitting: Family Medicine

## 2022-02-14 MED ORDER — MOLNUPIRAVIR EUA 200MG CAPSULE: 4.0000 | ORAL_CAPSULE | Freq: Two times a day (BID) | ORAL | 0 refills | Status: DC

## 2022-02-14 MED ORDER — LORAZEPAM 1 MG PO TABS: ORAL_TABLET | ORAL | 2 refills | Status: DC

## 2022-02-14 NOTE — Telephone Encounter (Signed)
Pt tested + for covid today an dis hoping to get the antiviral, she also mentioned if there was some type of IV she could use. Her sxs include headache, fever and rib pain, and body aches.   Medication: pt thought she was out of LORazepam (ATIVAN) 1 MG tablet but it looks like it was sent 8/25  Has the patient contacted their pharmacy? Yes.    Preferred Pharmacy:  [place], [place] - [place]   [address] [place] [address]  Phone:  [phone]  Fax:  [phone]

## 2022-02-14 NOTE — Telephone Encounter (Signed)
Appt changed and pt aware.

## 2022-02-14 NOTE — Telephone Encounter (Signed)
Please see note below and advise  

## 2022-02-14 NOTE — Telephone Encounter (Signed)
Can swap it out to a mychart video visit

## 2022-02-14 NOTE — Telephone Encounter (Signed)
Patient tested positive for covid today, has appt. Sched tomorrow and wanted to see if possible she have a telephone visit. Patient would like a call back. Thank you

## 2022-02-15 ENCOUNTER — Telehealth (INDEPENDENT_AMBULATORY_CARE_PROVIDER_SITE_OTHER): Payer: PPO | Admitting: Internal Medicine

## 2022-02-15 ENCOUNTER — Encounter: Payer: Self-pay | Admitting: Internal Medicine

## 2022-02-15 DIAGNOSIS — K529 Noninfective gastroenteritis and colitis, unspecified: Secondary | ICD-10-CM

## 2022-02-15 MED ORDER — NA SULFATE-K SULFATE-MG SULF 17.5-3.13-1.6 GM/177ML PO SOLN: 1.0000 | Freq: Once | ORAL | 0 refills | Status: AC

## 2022-02-15 NOTE — Addendum Note (Signed)
Addended by: Darrall Dears on: 02/15/2022 02:12 PM   Modules accepted: Orders

## 2022-02-15 NOTE — Telephone Encounter (Signed)
Lvm with below

## 2022-02-15 NOTE — Patient Instructions (Signed)
_______________________________________________________  If you are age 65 or older, your body mass index should be between 23-30. Your There is no height or weight on file to calculate BMI. If this is out of the aforementioned range listed, please consider follow up with your Primary Care Provider.  If you are age 53 or younger, your body mass index should be between 19-25. Your There is no height or weight on file to calculate BMI. If this is out of the aformentioned range listed, please consider follow up with your Primary Care Provider.   You have been scheduled for a colonoscopy. Please follow written instructions given to you at your visit today.  Please pick up your prep supplies at the pharmacy within the next 1-3 days. If you use inhalers (even only as needed), please bring them with you on the day of your procedure.  The [facility] providers would like to encourage you to use [facility] to communicate with providers for non-urgent requests or questions.  Due to long hold times on the telephone, sending your provider a message by [facility] may be a faster and more efficient way to get a response.  Please allow 48 business hours for a response.  Please remember that this is for non-urgent requests.   It was a pleasure to see you today!  Thank you for trusting me with your gastrointestinal care!

## 2022-02-15 NOTE — Progress Notes (Signed)
Subjective:    Patient ID: Victoria [name], female    DOB: [birth year], 66 y.o.   MRN: [id number]  This service was provided via telemedicine with audio/visual communication. The patient was located at home The provider was located in provider's GI office. The patient did consent to this telephone visit and is aware of possible charges through their insurance for this visit.   The persons participating in this telemedicine service were the patient and I. Time spent on call: 20 min   HPI Victoria [name] is a 66 year old female with a history of GERD with hiatal hernia, Schatzki's ring with dilation in 2016, colonic diverticulosis with history of diverticulitis, history of pulmonary aspergillosis with eosinophilic asthma and bronchiectasis who is here for follow-up.  She is seen by MyChart video visit today.  She was last seen in June 2023 by Victoria Savoy, NP.  At her last visit she was having bowel changes since April.  She was having multiple nonbloody loose and sometimes watery bowel movements per day including nocturnal stools.  Victoria [name] raise the question of microscopic colitis, SIBO.  She felt that IBD, EPI and celiac disease were less likely.  Since seeing Victoria [name] she saw rheumatology and had repeat positive ANA at 1-80; her CK was elevated at 328.  Remaining autoimmune evaluation negative including normal sed rate.  She did have a positive fecal calprotectin based on her last colonoscopy where erythema was seen in the sigmoid we treated her with mesalamine 2.4 g daily for possible segmental colitis associated with diverticulosis.  She reports that she took this and at first she thought it was helping tremendously and then she became constipated.  She stopped altogether and the loose stools returned.  She then took 1.2 g/day which worked for several days and then she would have diarrhea throughout the evening at night.  She went back on 2.4 g/day until the prescription completed about 4 days ago.   She has unfortunately continued to have intermittent diarrhea using Imodium or Lomotil.  No blood in stool.  Mild abdominal cramping.  Unfortunately over the last 2 days she has developed whole body aches as well as fever and cough.  She has been diagnosed with COVID-19.  She was started on molnupiravir today.  She also has been seen by rheumatology for her positive ANA and inflammatory joint symptoms.  Review of Systems As per HPI, otherwise negative  Current Medications, Allergies, Past Medical History, Past Surgical History, Family History and Social History were reviewed in [employer] record.     Objective:   Physical Exam There were no vitals taken for this visit. Gen: Generally well-appearing by video visit today HEENT: anicteric  Neuro: nonfocal      Assessment & Plan:  66 year old female with a history of GERD with hiatal hernia, Schatzki's ring with dilation in 2016, colonic diverticulosis with history of diverticulitis, history of pulmonary aspergillosis with eosinophilic asthma and bronchiectasis who is here for follow-up.   Inflammatory diarrhea/history of diverticulosis without colitis by biopsy in 2021 --her symptoms over the last several months have been frequent loose stools and diarrhea including nocturnal stools.  The positive fecal calprotectin does raise the question of IBD, segmental colitis associated with diverticulosis and microscopic colitis.  She responded partially though incompletely to mesalamine even at 2.4 g daily.  Given ongoing symptoms despite this therapy I recommend that we perform a colonoscopy to rule out IBD and microscopic colitis. -- She will remain off of mesalamine for now --  She can use Imodium per box instruction and Lomotil per prior prescription as needed until colonoscopy -- Colonoscopy in the [facility] for diarrhea evaluation  2.  COVID-19 infection --currently on molnupiravir.  Please delay colonoscopy at least 14 days  from today assuming that she is better and recovered from her COVID infection  30 minutes total spent today including patient facing time, coordination of care, reviewing medical history/procedures/pertinent radiology studies, and documentation of the encounter.

## 2022-02-15 NOTE — Telephone Encounter (Signed)
Called pt was advised and pt stated she understand

## 2022-02-16 ENCOUNTER — Encounter: Payer: Self-pay | Admitting: Internal Medicine

## 2022-02-18 ENCOUNTER — Telehealth (INDEPENDENT_AMBULATORY_CARE_PROVIDER_SITE_OTHER): Payer: PPO | Admitting: Family Medicine

## 2022-02-18 ENCOUNTER — Telehealth: Payer: Self-pay | Admitting: Family Medicine

## 2022-02-18 DIAGNOSIS — F419 Anxiety disorder, unspecified: Secondary | ICD-10-CM

## 2022-02-18 DIAGNOSIS — F32A Depression, unspecified: Secondary | ICD-10-CM

## 2022-02-18 DIAGNOSIS — H15001 Unspecified scleritis, right eye: Secondary | ICD-10-CM | POA: Diagnosis not present

## 2022-02-18 MED ORDER — OLANZAPINE 5 MG PO TABS: 5.0000 mg | ORAL_TABLET | Freq: Every day | ORAL | 1 refills | Status: DC

## 2022-02-18 MED ORDER — DULOXETINE HCL 30 MG PO CPEP: ORAL_CAPSULE | ORAL | 5 refills | Status: DC

## 2022-02-18 NOTE — Telephone Encounter (Signed)
Initial Comment Caller states she had switched her medication since the spring, with increasing doses. She is having anxiety and panic attacks. She did have COVID last week. Translation No Disp. Time Eilene Ghazi Time) Disposition Final User 02/18/2022 10:32:37 AM Attempt made - message left Deaton, RN, Levada Dy 02/18/2022 10:43:54 AM Attempt made - message left Deaton, RN, Levada Dy 02/18/2022 10:58:51 AM FINAL ATTEMPT MADE - message left Deaton, RN, Levada Dy 02/18/2022 10:59:04 AM Clinical Call Yes Rolin Barry, RN, Levada Dy Final Disposition 02/18/2022 10:59:04 AM Clinical Call Yes Deaton, RN, Levada Dy Comments User: Saverio Danker, RN Date/Time Eilene Ghazi Time): 02/18/2022 10:32:20 AM Caller states she had switched her medication since the spring, with increasing doses. She is having anxiety and panic attacks, depression, irritability. She did have COVID last week. Office called caller while on assessment. Will call back

## 2022-02-18 NOTE — Assessment & Plan Note (Signed)
Is following with opthamology they have given her steroid drops in right eye but she notes pressure and pain in both eyes.

## 2022-02-18 NOTE — Assessment & Plan Note (Addendum)
She is struggling with a flare of her anxiety and depression and is some worse since COVID. Will try dropping Duloxetine to 60 in am and 30 in pm. Will add Zyprexa 5 mg qhs and we will talk again in 2 weeks. If she needs emergency care she is advised to go to our 24/7 behavioral health location. She agrees to seek care as needed. Her greatest struggle is caring for her special needs son. She is ready to consider placement for him but they worry he will not get enough care in a facility so she is struggling with what to do. [facility] provides his 24/7 care but he needs placement. Discussed current state and developed plan of care over 35 minutes

## 2022-02-18 NOTE — Progress Notes (Signed)
MyChart Video Visit    Virtual Visit via Video Note   This visit type was conducted due to national recommendations for restrictions regarding the COVID-19 Pandemic (e.g. social distancing) in an effort to limit this patient's exposure and mitigate transmission in our community. This patient is at least at moderate risk for complications without adequate follow up. This format is felt to be most appropriate for this patient at this time. Physical exam was limited by quality of the video and audio technology used for the visit. Shamaine, CMA was able to get the patient set up on a video visit.  Patient location: home Patient and provider in visit Provider location: Office  I discussed the limitations of evaluation and management by telemedicine and the availability of in person appointments. The patient expressed understanding and agreed to proceed.  Visit Date: 02/18/2022  Today's healthcare provider: [name], MD     Subjective:    Patient ID: Victoria [name], female    DOB: 10/15/1955, 66 y.o.   MRN: [id number]  Chief Complaint  Patient presents with   Anxiety    Anxiety    HPI Patient is in today for evaluation of worsening anxiety and depression.  She continues to be caring for her adult child with special needs at home and it is a 24/7 job she admits her anxiety and depression are worsening.  She feels she has gotten worse with her COVID diagnosis.  She has increased general malaise, fatigue, myalgias as well as fuzzy thinking and anhedonia.  She is feeling more hopeless and realizes she needs to put together a long-term plan of care for her son but is feeling unclear about how to proceed with that or where he will get adequate care.  She does not endorse suicidal ideation but does acknowledge she feels less in control of her situation than normal. Denies CP/palp/SOB/HA/congestion/fevers/GI or GU c/o. Taking meds as prescribed   Past Medical History:  Diagnosis Date    Allergic bronchopulmonary aspergillosis ([place]) 2008   sees Dr Edmund Hilda pulmonology   Anemia    iron deficiency, resolved   Anxiety    Asthma    CAD (coronary artery disease)    a. LHC 6/16:  oOM1 60, pRCA 25 >> med Rx b. cath 3/19 2nd OM with 95% stenosis s/p synergy DES & anomalous RCA   CAP (community acquired pneumonia) 2016; 06/07/2016   Chronic bronchitis ([facility])    Chronic lower back pain    Complication of anesthesia    "think I have a hard time waking up from it"   COPD (chronic obstructive pulmonary disease) ([place])    Depression    mild   Diverticulitis    Diverticulosis    GERD (gastroesophageal reflux disease)    H/O hiatal hernia    Headache    "weekly" (08/23/2017)   History of echocardiogram    Echo 6/16:  Mod LVH, EF 60-65%, no RWMA, Gr 1 DD, trivial MR, normal LA size.   Hyperglycemia 11/20/2015   Hyperlipidemia, mixed 09/11/2007   Qualifier: Diagnosis of  By: Jerold Coombe   Did not tolerate Lipitor, zocor, Lovastatin, Pravastatin, Livalo, Crestor even low dose    IBS (irritable bowel syndrome)    Maxillary sinusitis    Normal cardiac stress test 11/2011   No evidence of ischemia or infarct.   Calculated ejection fraction 72%.   Obesity    OSA (obstructive sleep apnea) 02/2012   has stopped using  cpap  Osteoarthritis    Osteoporosis    Pneumonia 11/2011   "before 2013 I hadn't had pneumonia since I was a child" (04/13/2012)   Pulmonary nodules    S/P angioplasty with stent 08/23/17 ostial 2nd OM with DES synnergy 08/24/2017   Schatzki's ring     Past Surgical History:  Procedure Laterality Date   APPENDECTOMY  1989   CARDIAC CATHETERIZATION N/A 11/25/2014   Procedure: Right/Left Heart Cath and Coronary Angiography;  Surgeon: Belva Crome, MD;  Location: [place] [facility];  Service: Cardiovascular;  Laterality: N/A;   [place] WITH STENT PLACEMENT  08/23/2017   CORONARY STENT INTERVENTION N/A 08/23/2017   Procedure:  CORONARY STENT INTERVENTION;  Surgeon: Burnell Blanks, MD;  Location: [place] [facility];  Service: Cardiovascular;  Laterality: N/A;   HERNIA REPAIR  04/13/2012   VHR laparoscopic   LEFT HEART CATH AND CORONARY ANGIOGRAPHY N/A 08/23/2017   Procedure: LEFT HEART CATH AND CORONARY ANGIOGRAPHY;  Surgeon: Burnell Blanks, MD;  Location: [place] [facility];  Service: Cardiovascular;  Laterality: N/A;   VENTRAL HERNIA REPAIR  04/13/2012   Procedure: LAPAROSCOPIC VENTRAL HERNIA;  Surgeon: Adin Hector, MD;  Location: [place];  Service: General;  Laterality: N/A;  laparoscopic repair of incarcerated hernia    Family History  Problem Relation Age of Onset   Breast cancer Mother    Hypertension Mother    Diabetes Mother    Cancer Mother        recurrent, metastatic breast cancer.    Diverticulosis Father    Prostate cancer Father    Breast cancer Sister    Cancer Sister        breast cancer, invasive ductal carcinoma in 2022,DCIS at 61 with 4 weeks of radiation, 5 years of Tamoxifen    Pulmonary embolism Brother        recurrent   Heart attack Maternal Grandfather    Cerebral palsy Son    Cancer Niece 73   Osteoporosis Niece    Stroke Neg Hx    Colon cancer Neg Hx    Esophageal cancer Neg Hx    Stomach cancer Neg Hx    Rectal cancer Neg Hx     Social History   Socioeconomic History   Marital status: Married    Spouse name: Not on file   Number of children: 1   Years of education: 14   Highest education level: Associate degree: academic program  Occupational History   Occupation: Disabled   Tobacco Use   Smoking status: Never    Passive exposure: Never   Smokeless tobacco: Never  Vaping Use   Vaping Use: Never used  Substance and Sexual Activity   Alcohol use: Yes    Comment: social use   Drug use: No   Sexual activity: Yes    Comment: gluten free, lives with husband and son with CP quadriplegia  Other Topics Concern   Not on file  Social History  Narrative   Cares for son with cerebral palsy.    Lives at home with her husband and son.   Right-handed.   2 cups caffeine per day.   One story home   Social Determinants of Health   Financial Resource Strain: Low Risk  (10/19/2021)   Overall Financial Resource Strain (CARDIA)    Difficulty of Paying Living Expenses: Not hard at all  Food Insecurity: No Food Insecurity (10/19/2021)   Hunger Vital Sign  Worried About Charity fundraiser in the Last Year: Never true    [place] in the Last Year: Never true  Transportation Needs: No Transportation Needs (10/19/2021)   PRAPARE - Hydrologist (Medical): No    Lack of Transportation (Non-Medical): No  Physical Activity: Insufficiently Active (10/19/2021)   Exercise Vital Sign    Days of Exercise per Week: 7 days    Minutes of Exercise per Session: 20 min  Stress: Stress Concern Present (10/19/2021)   [place]    Feeling of Stress : Rather much  Social Connections: Moderately Integrated (10/19/2021)   Social Connection and Isolation Panel [NHANES]    Frequency of Communication with Friends and Family: More than three times a week    Frequency of Social Gatherings with Friends and Family: Once a week    Attends Religious Services: More than 4 times per year    Active Member of [employer] or Organizations: No    Attends Archivist Meetings: Never    Marital Status: Married  Human resources officer Violence: Not At Risk (10/19/2021)   Humiliation, Afraid, Rape, and Kick questionnaire    Fear of Current or Ex-Partner: No    Emotionally Abused: No    Physically Abused: No    Sexually Abused: No    Outpatient Medications Prior to Visit  Medication Sig Dispense Refill   albuterol (VENTOLIN HFA) 108 (90 Base) MCG/ACT inhaler Inhale 1-2 puffs into the lungs every 6 (six) hours as needed. 8 g 2   aspirin 81 MG tablet Take 81 mg by mouth daily.      Benralizumab (FASENRA PEN) 30 MG/ML SOAJ Inject 1 mL (30 mg total) into the skin every 8 (eight) weeks. 1 mL 2   Bioflavonoid Products (ESTER C PO) Take 500 mg by mouth daily. (Patient not taking: Reported on 01/25/2022)     Calcium-Magnesium-Vitamin D (CALCIUM MAGNESIUM PO) Take 1 tablet by mouth daily.     celecoxib (CELEBREX) 200 MG capsule One to 2 tablets by mouth daily as needed for pain. 60 capsule 2   Coenzyme Q10 (CO Q 10 PO) Take by mouth daily.     diphenoxylate-atropine (LOMOTIL) 2.5-0.025 MG tablet Take 1 tablet by mouth 4 (four) times daily as needed for diarrhea or loose stools. 40 tablet 0   Evolocumab (REPATHA SURECLICK) 734 MG/ML SOAJ INJECT 1 PEN INTO THE SKIN EVERY 14 DAYS 6 mL 3   famotidine (PEPCID) 20 MG tablet TAKE 1 TABLET BY MOUTH TWICE A DAY 60 tablet 2   HYDROcodone-acetaminophen (NORCO/VICODIN) 5-325 MG tablet Take 1 tablet by mouth every 8 (eight) hours as needed. 12 tablet 0   ipratropium-albuterol (DUONEB) 0.5-2.5 (3) MG/3ML SOLN USE 1 VIAL BY NEBULIZATION EVERY 6 (SIX) HOURS AS NEEDED. 360 mL 5   LORazepam (ATIVAN) 1 MG tablet TAKE 1/2 TO 1 TABLET BY MOUTH 2 TIMES A DAY AS NEEDED FOR ANXIETY OR SLEEP 30 tablet 2   mesalamine (LIALDA) 1.2 g EC tablet Take 2 tablets (2.4 g total) by mouth daily with breakfast. (Patient taking differently: Take 1.2 g by mouth daily with breakfast.) 60 tablet 6   nitroGLYCERIN (NITROSTAT) 0.4 MG SL tablet PLACE 1 TABLET UNDER THE TONGUE EVERY 5 MINUTES AS NEEDED FOR CHEST PAIN. 25 tablet 8   PROLIA 60 MG/ML SOSY injection SMARTSIG:60 Milligram(s) SUB-Q Once     VITAMIN D PO Take 5,000 Units by mouth daily.  DULoxetine (CYMBALTA) 30 MG capsule Take 2 capsules (60 mg total) by mouth 2 (two) times daily. TAKE 3 CAPSULES (90 MG TOTAL) BY MOUTH DAILY. 120 capsule 5   molnupiravir EUA (LAGEVRIO) 200 mg CAPS capsule Take 4 capsules (800 mg total) by mouth 2 (two) times daily for 5 days. 40 capsule 0   ZINC CITRATE PO Take 30 mg by mouth daily.  (Patient not taking: Reported on 01/25/2022)     No facility-administered medications prior to visit.    Allergies  Allergen Reactions   Beclomethasone Dipropionate Hives and Other (See Comments)     weight gain   Flexeril [Cyclobenzaprine] Anxiety   Mometasone Furo-Formoterol Fum Hives and Other (See Comments)    weight gain   Sulfonamide Derivatives Hives and Rash   Statins     Myalgias, RLS    ROS     Objective:    Physical Exam  There were no vitals taken for this visit. Wt Readings from Last 3 Encounters:  01/25/22 152 lb 3.2 oz (69 kg)  01/05/22 149 lb (67.6 kg)  12/06/21 149 lb (67.6 kg)    Diabetic Foot Exam - Simple   No data filed    Lab Results  Component Value Date   WBC 8.5 09/23/2021   HGB 12.9 09/23/2021   HCT 38.7 09/23/2021   PLT 303 09/23/2021   GLUCOSE 90 09/23/2021   CHOL 172 07/06/2021   TRIG 110.0 07/06/2021   HDL 55.30 07/06/2021   LDLDIRECT 177.8 09/07/2011   LDLCALC 95 07/06/2021   ALT 24 09/23/2021   AST 20 09/23/2021   NA 135 09/23/2021   K 4.2 09/23/2021   CL 99 09/23/2021   CREATININE 0.57 09/23/2021   BUN 21 09/23/2021   CO2 21 09/23/2021   TSH 1.82 07/06/2021   INR 1.0 08/21/2017   HGBA1C 5.5 07/06/2021    Lab Results  Component Value Date   TSH 1.82 07/06/2021   Lab Results  Component Value Date   WBC 8.5 09/23/2021   HGB 12.9 09/23/2021   HCT 38.7 09/23/2021   MCV 90 09/23/2021   PLT 303 09/23/2021   Lab Results  Component Value Date   NA 135 09/23/2021   K 4.2 09/23/2021   CO2 21 09/23/2021   GLUCOSE 90 09/23/2021   BUN 21 09/23/2021   CREATININE 0.57 09/23/2021   BILITOT <0.2 09/23/2021   ALKPHOS 63 09/23/2021   AST 20 09/23/2021   ALT 24 09/23/2021   PROT 6.3 09/23/2021   ALBUMIN 4.5 09/23/2021   CALCIUM 9.2 09/23/2021   ANIONGAP 6 08/24/2017   EGFR 101 09/23/2021   GFR 95.80 07/06/2021   Lab Results  Component Value Date   CHOL 172 07/06/2021   Lab Results  Component Value Date   HDL  55.30 07/06/2021   Lab Results  Component Value Date   LDLCALC 95 07/06/2021   Lab Results  Component Value Date   TRIG 110.0 07/06/2021   Lab Results  Component Value Date   CHOLHDL 3 07/06/2021   Lab Results  Component Value Date   HGBA1C 5.5 07/06/2021       Assessment & Plan:   Problem List Items Addressed This Visit     Anxiety and depression    She is struggling with a flare of her anxiety and depression and is some worse since COVID. Will try dropping Duloxetine to 60 in am and 30 in pm. Will add Zyprexa 5 mg qhs and we will talk again in  2 weeks. If she needs emergency care she is advised to go to our 24/7 behavioral health location. She agrees to seek care as needed. Her greatest struggle is caring for her special needs son. She is ready to consider placement for him but they worry he will not get enough care in a facility so she is struggling with what to do. [facility] Lemmings provides his 24/7 care but he needs placement. Discussed current state and developed plan of care over 35 minutes      Relevant Medications   DULoxetine (CYMBALTA) 30 MG capsule   Scleritis of right eye    Is following with opthamology they have given her steroid drops in right eye but she notes pressure and pain in both eyes.        I have discontinued Tenna Child. [place]'s ZINC CITRATE PO and molnupiravir EUA. I have also changed her DULoxetine. Additionally, I am having her start on OLANZapine. Lastly, I am having her maintain her aspirin, Bioflavonoid Products (ESTER C PO), VITAMIN D PO, albuterol, ipratropium-albuterol, Fasenra Pen, Calcium-Magnesium-Vitamin D (CALCIUM MAGNESIUM PO), HYDROcodone-acetaminophen, famotidine, Prolia, diphenoxylate-atropine, mesalamine, celecoxib, Repatha SureClick, Coenzyme Z61 (CO Q 10 PO), nitroGLYCERIN, and LORazepam.  Meds ordered this encounter  Medications   DULoxetine (CYMBALTA) 30 MG capsule    Sig: 2 caps po q am and 1 cap q pm    Dispense:  120 capsule    Refill:   5   OLANZapine (ZYPREXA) 5 MG tablet    Sig: Take 1 tablet (5 mg total) by mouth at bedtime.    Dispense:  30 tablet    Refill:  1    I discussed the assessment and treatment plan with the patient. The patient was provided an opportunity to ask questions and all were answered. The patient agreed with the plan and demonstrated an understanding of the instructions.   The patient was advised to call back or seek an in-person evaluation if the symptoms worsen or if the condition fails to improve as anticipated.     [name], MD [facility] [phone] (phone) [phone] (fax)  [place]

## 2022-02-18 NOTE — Telephone Encounter (Signed)
Patient called stating she is currently very anxious and not in a good place, she stated the cymbalta is not working and she cannot wait until her appt on 09/15 to discuss. She stated she needed to talk to someone right away and could not wait. Patient was advised to speak to triage nurse, and a message would be sent to Dr. Charlett Blake to see. Please advise.

## 2022-02-18 NOTE — Telephone Encounter (Signed)
Pt had VV with Dr.Blyth today

## 2022-02-19 NOTE — Progress Notes (Signed)
Office Visit Note  Patient: Victoria [name]             Date of Birth: [birth year]           MRN: [id number]             PCP: Mosie Lukes, MD Referring: Mosie Lukes, MD Visit Date: 03/02/2022 Occupation: _0 @  Subjective:  Pain in multiple joints  History of Present Illness: Victoria [name] is a 66 y.o. female history of osteoarthritis.  She continues to have pain and discomfort in her elbows, wrists, hands, trochanteric bursa and her knee joints.  She states she had knee joint injection by Dr. Raeford Razor which were helpful but now the pain is coming back.  She continues to have upper and lower back pain.  She has noticed depression in her right shin.  She states she called [facility] last night who came to evaluate her and they were concerned about muscle atrophy.  Patient states that she had COVID-19 virus infection about 3 weeks ago.  She was treated with antiviral medications.  She also saw Dr. Hilarie Fredrickson recently.  She continues to have chronic diarrhea and is a scheduled for colonoscopy next month.  Activities of Daily Living:  Patient reports morning stiffness for 0 minutes.   Patient Denies nocturnal pain.  Difficulty dressing/grooming: Denies Difficulty climbing stairs: Reports Difficulty getting out of chair: Denies Difficulty using hands for taps, buttons, cutlery, and/or writing: Denies  Review of Systems  Constitutional:  Negative for fatigue.  HENT:  Negative for mouth sores and mouth dryness.   Eyes:  Positive for dryness.  Respiratory:  Positive for shortness of breath.   Cardiovascular:  Negative for chest pain and palpitations.  Gastrointestinal:  Positive for diarrhea. Negative for blood in stool and constipation.  Endocrine: Negative for increased urination.  Genitourinary:  Negative for involuntary urination.  Musculoskeletal:  Positive for joint pain, joint pain, myalgias, muscle tenderness and myalgias. Negative for gait problem, joint swelling, muscle  weakness and morning stiffness.  Skin:  Negative for color change, rash, hair loss and sensitivity to sunlight.  Allergic/Immunologic: Negative for susceptible to infections.  Neurological:  Negative for dizziness and headaches.  Hematological:  Negative for swollen glands.  Psychiatric/Behavioral:  Positive for sleep disturbance. Negative for depressed mood. The patient is not nervous/anxious.     PMFS History:  Patient Active Problem List   Diagnosis Date Noted   Scleritis of right eye 02/18/2022   Diarrhea 11/16/2021   Lumbar radiculopathy 09/20/2021   Sacroiliac joint dysfunction of right side 08/16/2021   Greater trochanteric pain syndrome of left lower extremity 07/29/2021   Diverticulitis 07/07/2021   Iron deficiency anemia 09/21/2020   Family history of breast cancer 09/21/2020   Preventative health care 07/01/2020   Hemorrhoids 07/01/2020   LUQ discomfort 07/01/2020   Statin intolerance 01/10/2020   Occipital pain 06/27/2019   Tinnitus of both ears 06/27/2019   Statin myopathy 06/19/2019   Other cervical disc degeneration, unspecified cervical region 06/03/2019   Headache 03/19/2019   Tinnitus 03/19/2019   Heart disease 02/21/2019   Neck pain 02/17/2019   Toe pain, right 10/09/2018   Hx of cold sores 10/09/2018   Bronchitis with asthma, acute 07/18/2018   Osteoporosis 05/23/2018   S/P angioplasty with stent 08/23/17 ostial 2nd OM with DES synnergy 08/24/2017   CAD (coronary artery disease)    Anxiety and depression 01/16/2017   Insomnia 01/16/2017   Acute recurrent sinusitis 05/27/2016   Muscle  spasm 05/03/2016   Chronic pain 05/03/2016   Severe persistent asthma with intensive monitoring 03/22/2016   BMI 30.0-30.9,adult 02/18/2016   MRSA (methicillin resistant Staphylococcus aureus) colonization 01/12/2016   Sleep disorder due to stress 01/12/2016   Hyperglycemia 11/20/2015   Chronic diastolic CHF (congestive heart failure), NYHA class 2 ([place]) 06/26/2015    Coronary artery disease due to lipid rich plaque 50/53/9767   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[place]) 34/19/3790   Cardiomegaly 11/21/2014   Seasonal and perennial allergic rhinitis 10/22/2011   Fatigue 09/01/2011   Chest pain, atypical 06/01/2011   OSA (obstructive sleep apnea) 24/02/7352   UMBILICAL HERNIA 29/92/4268   DIVERTICULOSIS, COLON 06/11/2010   HEPATIC CYST 06/11/2010   Idiopathic scoliosis and kyphoscoliosis 06/11/2010   TRANSAMINASES, SERUM, ELEVATED 05/27/2010   Abdominal pain 01/15/2010   ELEVATED BP READING WITHOUT DX HYPERTENSION 08/03/2009   Gastroesophageal reflux disease with hiatal hernia 10/21/2008   h/o PALPITATIONS 08/19/2008   A B P A-ALLERGIC BRONCHOPULMONARY ASPERGILLOSIS 12/14/2007   Asthma 11/16/2007   Hyperlipidemia, mixed 09/11/2007   STRESS INCONTINENCE 09/11/2007   Cushing's syndrome ([place]) 12/11/2006   OA (osteoarthritis) of knee 12/11/2006    Past Medical History:  Diagnosis Date   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 LHC 6/16:  oOM1 60, pRCA 25 >> med Rx b. cath 3/19 2nd OM with 95% stenosis s/p synergy DES & anomalous RCA   CAP (community acquired pneumonia) 2016; 06/07/2016   Chronic bronchitis (HCC)    Chronic lower back pain    Complication of anesthesia    "think I have a hard time waking up from it"   COPD (chronic obstructive pulmonary disease) ([place])    Depression    mild   Diverticulitis    Diverticulosis    GERD (gastroesophageal reflux disease)    H/O hiatal hernia    Headache    "weekly" (08/23/2017)   History of echocardiogram    Echo 6/16:  Mod LVH, EF 60-65%, no RWMA, Gr 1 DD, trivial MR, normal LA size.   Hyperglycemia 11/20/2015   Hyperlipidemia, mixed 09/11/2007   Qualifier: Diagnosis of  By: Jerold Coombe   Did not tolerate Lipitor, zocor, Lovastatin, Pravastatin, Livalo, Crestor  even low dose    IBS (irritable bowel syndrome)    Maxillary sinusitis    Normal cardiac stress test 11/2011   No evidence of ischemia or infarct.   Calculated ejection fraction 72%.   Obesity    OSA (obstructive sleep apnea) 02/2012   has stopped using  cpap   Osteoarthritis    Osteoporosis    Pneumonia 11/2011   "before 2013 I hadn't had pneumonia since I was a child" (04/13/2012)   Pulmonary nodules    S/P angioplasty with stent 08/23/17 ostial 2nd OM with DES synnergy 08/24/2017   Schatzki's ring     Family History  Problem Relation Age of Onset   Breast cancer Mother    Hypertension Mother    Diabetes Mother    Cancer Mother        recurrent, metastatic breast cancer.    Diverticulosis Father    Prostate cancer Father    Breast cancer Sister    Cancer Sister        breast cancer, invasive ductal carcinoma in 2022,DCIS at 22 with 4 weeks of radiation, 5 years  of Tamoxifen    Pulmonary embolism Brother        recurrent   Heart attack Maternal Grandfather    Cerebral palsy Son    Cancer Niece 34   Osteoporosis Niece    Stroke Neg Hx    Colon cancer Neg Hx    Esophageal cancer Neg Hx    Stomach cancer Neg Hx    Rectal cancer Neg Hx    Past Surgical History:  Procedure Laterality Date   APPENDECTOMY  1989   CARDIAC CATHETERIZATION N/A 11/25/2014   Procedure: Right/Left Heart Cath and Coronary Angiography;  Surgeon: Belva Crome, MD;  Location: [facility];  Service: Cardiovascular;  Laterality: N/A;   [place] WITH STENT PLACEMENT  08/23/2017   CORONARY STENT INTERVENTION N/A 08/23/2017   Procedure: CORONARY STENT INTERVENTION;  Surgeon: Burnell Blanks, MD;  Location: [facility];  Service: Cardiovascular;  Laterality: N/A;   HERNIA REPAIR  04/13/2012   VHR laparoscopic   LEFT HEART CATH AND CORONARY ANGIOGRAPHY N/A 08/23/2017   Procedure: LEFT HEART CATH AND CORONARY ANGIOGRAPHY;  Surgeon: Burnell Blanks,  MD;  Location: [facility];  Service: Cardiovascular;  Laterality: N/A;   VENTRAL HERNIA REPAIR  04/13/2012   Procedure: LAPAROSCOPIC VENTRAL HERNIA;  Surgeon: Adin Hector, MD;  Location: [facility];  Service: General;  Laterality: N/A;  laparoscopic repair of incarcerated hernia   Social History   Social History Narrative   Cares for son with cerebral palsy.    Lives at home with her husband and son.   Right-handed.   2 cups caffeine per day.   One story home   Immunization History  Administered Date(s) Administered   Influenza Split 04/20/2011, 04/20/2020   Influenza Whole 06/06/2007, 04/15/2008, 04/02/2009, 03/29/2012   Influenza, High Dose Seasonal PF 05/05/2015   Influenza,inj,Quad PF,6+ Mos 05/09/2013, 03/03/2014, 05/03/2016, 04/21/2017, 04/02/2018, 03/19/2019, 04/20/2020, 05/03/2021   Moderna Sars-Covid-2 Vaccination 08/15/2019, 09/17/2019, 05/16/2020, 12/04/2020   Pfizer Covid-19 Vaccine Bivalent Booster 18yr & up 04/16/2021   Pneumococcal Conjugate-13 05/09/2013   Pneumococcal Polysaccharide-23 05/04/2005   Td 07/29/2009   Tdap 03/11/2015   Zoster Recombinat (Shingrix) 05/09/2019, 08/01/2019     Objective: Vital Signs: BP 112/72 (BP Location: Left Arm, Patient Position: Sitting, Cuff Size: Normal)   Pulse 90   Ht _0  (1.549 m)   Wt 154 lb 9.6 oz (70.1 kg)   BMI 29.21 kg/m    Physical Exam Vitals and nursing note reviewed.  Constitutional:      Appearance: She is well-developed.  HENT:     Head: Normocephalic and atraumatic.  Eyes:     Conjunctiva/sclera: Conjunctivae normal.  Cardiovascular:     Rate and Rhythm: Normal rate and regular rhythm.     Heart sounds: Normal heart sounds.     Comments: Varicose veins were noted on bilateral lower extremities. Pulmonary:     Effort: Pulmonary effort is normal.     Breath sounds: Normal breath sounds.  Abdominal:     General: Bowel sounds are normal.     Palpations: Abdomen is soft.  Musculoskeletal:      Cervical back: Normal range of motion.  Lymphadenopathy:     Cervical: No cervical adenopathy.  Skin:    General: Skin is warm and dry.     Capillary Refill: Capillary refill takes less than 2 seconds.  Neurological:     Mental Status: She is alert and oriented to person,  place, and time.  Psychiatric:        Behavior: Behavior normal.      Musculoskeletal Exam: She had limited lateral rotation of the cervical spine.  Thoracic kyphosis was noted.  Thoracolumbar scoliosis was noted.  She had discomfort range of motion of her thoracic and lumbar spine.  Shoulder joints, elbow joints, wrist joints, MCPs PIPs and DIPs were in good range of motion.  No synovitis was noted.  Hip joints and knee joints with good range of motion.  She has some discomfort range of motion of her right knee joint.  Some warmth was noted on palpation of the right knee joint.  There was no tenderness over ankles or MTPs.  CDAI Exam: CDAI Score: -- Patient Global: --; Provider Global: -- Swollen: --; Tender: -- Joint Exam 03/02/2022   No joint exam has been documented for this visit   There is currently no information documented on the homunculus. Go to the Rheumatology activity and complete the homunculus joint exam.  Investigation: No additional findings.  Imaging: No results found.  Recent Labs: Lab Results  Component Value Date   WBC 8.5 09/23/2021   HGB 12.9 09/23/2021   PLT 303 09/23/2021   NA 135 09/23/2021   K 4.2 09/23/2021   CL 99 09/23/2021   CO2 21 09/23/2021   GLUCOSE 90 09/23/2021   BUN 21 09/23/2021   CREATININE 0.57 09/23/2021   BILITOT <0.2 09/23/2021   ALKPHOS 63 09/23/2021   AST 20 09/23/2021   ALT 24 09/23/2021   PROT 6.3 09/23/2021   ALBUMIN 4.5 09/23/2021   CALCIUM 9.2 09/23/2021   GFRAA >60 08/24/2017    January 25, 2022 ANA 1: 80NS, ENA negative, C3-C4 normal, beta-2 GP 1 negative, anticardiolipin negative, urine protein negative, ESR 6, anti-CCP negative, CK  328  Speciality Comments: No specialty comments available.  Procedures:  No procedures performed Allergies: Beclomethasone dipropionate, Flexeril [cyclobenzaprine], Mometasone furo-formoterol fum, Sulfonamide derivatives, and Statins   Assessment / Plan:     Visit Diagnoses: Positive ANA (antinuclear antibody) - Positive ANA, ENA negative, complements normal, sed rate normal.  Patient had low titer positive ANA.  ENA negative, complements normal, sed rate normal.  Lab findings were discussed with the patient at length.  She has no clinical features of lupus or related syndrome.  There is no history of oral ulcers, nasal ulcers, malar rash, photosensitivity, lymphadenopathy.  She states she had Raynaud's phenomenon in the past.  No sclerodactyly or nailbed capillary changes were noted.  Polyarthralgia - History of polyarthralgia since she was in her 45s.  History of pain in upper extremities, lower extremities and entire spine..  She has been followed by Dr. Percell Miller and Dr. Raeford Razor.  She had some warmth on palpation of her right knee today.  She also has history of chronic diarrhea and recent episode of scleritis.  She is a scheduled to have colonoscopy.  Rheumatoid factor and anti-CCP were negative.  I will consider obtaining ACE level and HLA-B 27 if her colonoscopy is negative.  Trochanteric bursitis, left hip -she had mild tenderness on the examination today.  Treated with left trochanteric bursa injection in the past.  Chronic pain of both knees -she continues to have pain and discomfort in her bilateral knee joints.  She had cortisone injection by Dr. Raeford Razor in July.  She states that the knee discomfort is coming back now.  She has some warmth on palpation of the right knee.  Mild osteoarthritis of the right knee  joint was noted on the x-rays.  X-rays of the left knee joint were unremarkable.  Scleritis of right eye- patient states that she was diagnosed with scleritis by Dr. Sabra Heck.  She was  treated with eyedrops and the symptoms resolved.  I will wait until her GI work-up is complete.  I may consider obtaining ACE level and HLA-B27 at the follow-up visit if the GI work-up is negative.  DDD (degenerative disc disease), thoracic - Multilevel spondylosis and facet joint arthropathy.  She gets epidural injections.  DDD (degenerative disc disease), lumbar -she continues to have lower back pain.  She has severe scoliosis and multilevel spondylosis and facet joint arthropathy.  Idiopathic scoliosis and kyphoscoliosis - She has history of chronic discomfort due to severe scoliosis.  Sacroiliac joint dysfunction of right side - Chronic intermittent SI joint pain related to lumbar disc disease per patient.  Elevated CK -her CK was elevated.  I will repeat CK today.  I will also obtain additional antibodies.  There is a note in the chart about prior history of a statin myopathy.  I could not find an elevated CK in the past.  Plan: CK, Aldolase, Anti-HMGCR Ab IgG, Myositis Specific II Antibodies Panel  Statin myopathy -states she developed muscle pain due to his statin use in the past.  I could not find a previous CK which was elevated.  Age-related osteoporosis without current pathological fracture - Treated with Fosamax in the past.  Currently on Prolia injections since May 2022.  Varicose veins of bilateral lower extremities with other complications-she is concerned about some of the prominent veins on her lower extremities.  The findings are consistent with varicose vein.  COVID-19 virus infection -patient developed COVID-19 infection 3 weeks ago and was treated with molnupiravir.  Other medical problems are listed as follows:  Chronic diastolic CHF (congestive heart failure), NYHA class 2 (HCC)  Coronary artery disease involving native coronary artery of native heart with unstable angina pectoris (HCC)  Cardiomegaly  S/P angioplasty with stent 08/23/17 ostial 2nd OM with DES  synnergy  Hyperlipidemia, mixed  A B P A-ALLERGIC BRONCHOPULMONARY ASPERGILLOSIS  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HCC)  Seasonal and perennial allergic rhinitis  OSA (obstructive sleep apnea)  History of diverticulosis  Chronic diarrhea-she has been experiencing chronic diarrhea.  She is scheduled to have colonoscopy by Dr. Hilarie Fredrickson.  Gastroesophageal reflux disease with hiatal hernia  Hepatic cyst  Cushing's syndrome (HCC)  MRSA (methicillin resistant Staphylococcus aureus) colonization  Restless leg syndrome  Anxiety and depression  Family history of breast cancer-mother, sister  Orders: Orders Placed This Encounter  Procedures   CK   Aldolase   Anti-HMGCR Ab IgG   Myositis Specific II Antibodies Panel   No orders of the defined types were placed in this encounter.    Follow-Up Instructions: Return in about 6 weeks (around 04/13/2022) for Elevated CK, osteoarthritis.   Bo Merino, MD  Note - This record has been created using Editor, commissioning.  Chart creation errors have been sought, but may not always  have been located. Such creation errors do not reflect on  the standard of medical care.

## 2022-02-23 ENCOUNTER — Ambulatory Visit: Payer: PPO | Admitting: Rheumatology

## 2022-03-01 ENCOUNTER — Telehealth: Payer: Self-pay | Admitting: Family Medicine

## 2022-03-01 DIAGNOSIS — M21861 Other specified acquired deformities of right lower leg: Secondary | ICD-10-CM | POA: Diagnosis not present

## 2022-03-01 NOTE — Telephone Encounter (Signed)
Pt states it looks like there is a dent in her leg and the vein around it is enlarged and sore. She is scared this may be a blood clot and didn't know if she should come in the office. Transferred to triage for nurse eval.

## 2022-03-01 NOTE — Telephone Encounter (Signed)
Called pt and she stated had call [facility] and they sending someone to look  Her leg. After calling our office.

## 2022-03-02 ENCOUNTER — Ambulatory Visit: Payer: PPO | Attending: Rheumatology | Admitting: Rheumatology

## 2022-03-02 ENCOUNTER — Encounter: Payer: Self-pay | Admitting: Rheumatology

## 2022-03-02 VITALS — BP 112/72 | HR 90 | Ht 61.0 in | Wt 154.6 lb

## 2022-03-02 DIAGNOSIS — M5136 Other intervertebral disc degeneration, lumbar region: Secondary | ICD-10-CM

## 2022-03-02 DIAGNOSIS — Z8719 Personal history of other diseases of the digestive system: Secondary | ICD-10-CM

## 2022-03-02 DIAGNOSIS — F32A Depression, unspecified: Secondary | ICD-10-CM

## 2022-03-02 DIAGNOSIS — G8929 Other chronic pain: Secondary | ICD-10-CM

## 2022-03-02 DIAGNOSIS — I25110 Atherosclerotic heart disease of native coronary artery with unstable angina pectoris: Secondary | ICD-10-CM

## 2022-03-02 DIAGNOSIS — J3089 Other allergic rhinitis: Secondary | ICD-10-CM

## 2022-03-02 DIAGNOSIS — M810 Age-related osteoporosis without current pathological fracture: Secondary | ICD-10-CM | POA: Diagnosis not present

## 2022-03-02 DIAGNOSIS — B4481 Allergic bronchopulmonary aspergillosis: Secondary | ICD-10-CM

## 2022-03-02 DIAGNOSIS — M25562 Pain in left knee: Secondary | ICD-10-CM

## 2022-03-02 DIAGNOSIS — H15001 Unspecified scleritis, right eye: Secondary | ICD-10-CM | POA: Diagnosis not present

## 2022-03-02 DIAGNOSIS — Z95820 Peripheral vascular angioplasty status with implants and grafts: Secondary | ICD-10-CM

## 2022-03-02 DIAGNOSIS — J8283 Eosinophilic asthma: Secondary | ICD-10-CM

## 2022-03-02 DIAGNOSIS — R768 Other specified abnormal immunological findings in serum: Secondary | ICD-10-CM | POA: Diagnosis not present

## 2022-03-02 DIAGNOSIS — M25561 Pain in right knee: Secondary | ICD-10-CM

## 2022-03-02 DIAGNOSIS — Z803 Family history of malignant neoplasm of breast: Secondary | ICD-10-CM

## 2022-03-02 DIAGNOSIS — G720 Drug-induced myopathy: Secondary | ICD-10-CM

## 2022-03-02 DIAGNOSIS — G4733 Obstructive sleep apnea (adult) (pediatric): Secondary | ICD-10-CM

## 2022-03-02 DIAGNOSIS — M533 Sacrococcygeal disorders, not elsewhere classified: Secondary | ICD-10-CM | POA: Diagnosis not present

## 2022-03-02 DIAGNOSIS — U071 COVID-19: Secondary | ICD-10-CM

## 2022-03-02 DIAGNOSIS — G2581 Restless legs syndrome: Secondary | ICD-10-CM

## 2022-03-02 DIAGNOSIS — K7689 Other specified diseases of liver: Secondary | ICD-10-CM

## 2022-03-02 DIAGNOSIS — M4120 Other idiopathic scoliosis, site unspecified: Secondary | ICD-10-CM

## 2022-03-02 DIAGNOSIS — M7062 Trochanteric bursitis, left hip: Secondary | ICD-10-CM

## 2022-03-02 DIAGNOSIS — I517 Cardiomegaly: Secondary | ICD-10-CM

## 2022-03-02 DIAGNOSIS — T466X5A Adverse effect of antihyperlipidemic and antiarteriosclerotic drugs, initial encounter: Secondary | ICD-10-CM

## 2022-03-02 DIAGNOSIS — M2550 Pain in unspecified joint: Secondary | ICD-10-CM | POA: Diagnosis not present

## 2022-03-02 DIAGNOSIS — K529 Noninfective gastroenteritis and colitis, unspecified: Secondary | ICD-10-CM

## 2022-03-02 DIAGNOSIS — F419 Anxiety disorder, unspecified: Secondary | ICD-10-CM

## 2022-03-02 DIAGNOSIS — M5134 Other intervertebral disc degeneration, thoracic region: Secondary | ICD-10-CM | POA: Diagnosis not present

## 2022-03-02 DIAGNOSIS — E782 Mixed hyperlipidemia: Secondary | ICD-10-CM

## 2022-03-02 DIAGNOSIS — E249 Cushing's syndrome, unspecified: Secondary | ICD-10-CM

## 2022-03-02 DIAGNOSIS — M51369 Other intervertebral disc degeneration, lumbar region without mention of lumbar back pain or lower extremity pain: Secondary | ICD-10-CM

## 2022-03-02 DIAGNOSIS — R748 Abnormal levels of other serum enzymes: Secondary | ICD-10-CM | POA: Diagnosis not present

## 2022-03-02 DIAGNOSIS — K449 Diaphragmatic hernia without obstruction or gangrene: Secondary | ICD-10-CM

## 2022-03-02 DIAGNOSIS — J302 Other seasonal allergic rhinitis: Secondary | ICD-10-CM

## 2022-03-02 DIAGNOSIS — Z22322 Carrier or suspected carrier of Methicillin resistant Staphylococcus aureus: Secondary | ICD-10-CM

## 2022-03-02 DIAGNOSIS — K219 Gastro-esophageal reflux disease without esophagitis: Secondary | ICD-10-CM

## 2022-03-02 DIAGNOSIS — I83893 Varicose veins of bilateral lower extremities with other complications: Secondary | ICD-10-CM

## 2022-03-02 DIAGNOSIS — I5032 Chronic diastolic (congestive) heart failure: Secondary | ICD-10-CM

## 2022-03-04 ENCOUNTER — Telehealth (INDEPENDENT_AMBULATORY_CARE_PROVIDER_SITE_OTHER): Payer: PPO | Admitting: Family Medicine

## 2022-03-04 DIAGNOSIS — G479 Sleep disorder, unspecified: Secondary | ICD-10-CM | POA: Diagnosis not present

## 2022-03-04 DIAGNOSIS — F419 Anxiety disorder, unspecified: Secondary | ICD-10-CM | POA: Diagnosis not present

## 2022-03-04 DIAGNOSIS — F32A Depression, unspecified: Secondary | ICD-10-CM

## 2022-03-04 MED ORDER — HYDROCODONE BIT-HOMATROP MBR 5-1.5 MG/5ML PO SOLN: 5.0000 mL | Freq: Three times a day (TID) | ORAL | 0 refills | Status: DC | PRN

## 2022-03-06 NOTE — Assessment & Plan Note (Addendum)
Doing much better with the decrease of Cymbalta to 90 mg and adding Zyprexa 5 mg qhs. Will continue same dosing for now. Spent 40 minutes discussing medications, current state and plan of care.

## 2022-03-06 NOTE — Progress Notes (Signed)
MyChart Video Visit    Virtual Visit via Video Note   This visit type was conducted due to national recommendations for restrictions regarding the COVID-19 Pandemic (e.g. social distancing) in an effort to limit this patient's exposure and mitigate transmission in our community. This patient is at least at moderate risk for complications without adequate follow up. This format is felt to be most appropriate for this patient at this time. Physical exam was limited by quality of the video and audio technology used for the visit. Shamaine, CMA was able to get the patient set up on a video visit.  Patient location: home Patient and provider in visit Provider location: Office  I discussed the limitations of evaluation and management by telemedicine and the availability of in person appointments. The patient expressed understanding and agreed to proceed.  Visit Date: 03/04/2022  Today's healthcare provider: [name], MD     Subjective:    Patient ID: Victoria [name], female    DOB: 14-Apr-1956, 66 y.o.   MRN: [id number]  No chief complaint on file.   HPI Patient is in today for follow up on chronic medical concerns. Her anxiety, depression and insomnia were flared at the last visit but with the decrease of Cymbalta to 90 mg daily and the addition of Zyprexa 5 mg qhs she is feeling much better. She is more settled and less anxious. He sleep is not perfect but it is better. Denies CP/palp/SOB/HA/congestion/fevers/GI or GU c/o. 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 LHC 6/16:  oOM1 60, pRCA 25 >> med Rx b. cath 3/19 2nd OM with 95% stenosis s/p synergy DES & anomalous RCA   CAP (community acquired pneumonia) 2016; 06/07/2016   Chronic bronchitis (HCC)    Chronic  lower back pain    Complication of anesthesia    "think I have a hard time waking up from it"   COPD (chronic obstructive pulmonary disease) ([place])    Depression    mild   Diverticulitis    Diverticulosis    GERD (gastroesophageal reflux disease)    H/O hiatal hernia    Headache    "weekly" (08/23/2017)   History of echocardiogram    Echo 6/16:  Mod LVH, EF 60-65%, no RWMA, Gr 1 DD, trivial MR, normal LA size.   Hyperglycemia 11/20/2015   Hyperlipidemia, mixed 09/11/2007   Qualifier: Diagnosis of  By: Jerold Coombe   Did not tolerate Lipitor, zocor, Lovastatin, Pravastatin, Livalo, Crestor even low dose    IBS (irritable bowel syndrome)    Maxillary sinusitis    Normal cardiac stress test 11/2011   No evidence of ischemia or infarct.   Calculated ejection fraction 72%.   Obesity    OSA (obstructive sleep apnea) 02/2012   has stopped using  cpap   Osteoarthritis    Osteoporosis    Pneumonia 11/2011   "before 2013 I hadn't had pneumonia since I was a child" (04/13/2012)   Pulmonary nodules    S/P angioplasty with stent 08/23/17 ostial 2nd OM with DES synnergy 08/24/2017   Schatzki's ring     Past Surgical History:  Procedure Laterality Date   [place] N/A 11/25/2014   Procedure:  Right/Left Heart Cath and Coronary Angiography;  Surgeon: Belva Crome, MD;  Location: [facility];  Service: Cardiovascular;  Laterality: N/A;   [place] WITH STENT PLACEMENT  08/23/2017   CORONARY STENT INTERVENTION N/A 08/23/2017   Procedure: CORONARY STENT INTERVENTION;  Surgeon: Burnell Blanks, MD;  Location: [facility];  Service: Cardiovascular;  Laterality: N/A;   HERNIA REPAIR  04/13/2012   VHR laparoscopic   LEFT HEART CATH AND CORONARY ANGIOGRAPHY N/A 08/23/2017   Procedure: LEFT HEART CATH AND CORONARY ANGIOGRAPHY;  Surgeon: Burnell Blanks, MD;  Location: [facility];  Service:  Cardiovascular;  Laterality: N/A;   VENTRAL HERNIA REPAIR  04/13/2012   Procedure: LAPAROSCOPIC VENTRAL HERNIA;  Surgeon: Adin Hector, MD;  Location: [facility];  Service: General;  Laterality: N/A;  laparoscopic repair of incarcerated hernia    Family History  Problem Relation Age of Onset   Breast cancer Mother    Hypertension Mother    Diabetes Mother    Cancer Mother        recurrent, metastatic breast cancer.    Diverticulosis Father    Prostate cancer Father    Breast cancer Sister    Cancer Sister        breast cancer, invasive ductal carcinoma in 2022,DCIS at 80 with 4 weeks of radiation, 5 years of Tamoxifen    Pulmonary embolism Brother        recurrent   Heart attack Maternal Grandfather    Cerebral palsy Son    Cancer Niece 21   Osteoporosis Niece    Stroke Neg Hx    Colon cancer Neg Hx    Esophageal cancer Neg Hx    Stomach cancer Neg Hx    Rectal cancer Neg Hx     Social History   Socioeconomic History   Marital status: Married    Spouse name: Not on file   Number of children: 1   Years of education: 14   Highest education level: Associate degree: academic program  Occupational History   Occupation: Disabled   Tobacco Use   Smoking status: Never    Passive exposure: Never   Smokeless tobacco: Never  Vaping Use   Vaping Use: Never used  Substance and Sexual Activity   Alcohol use: Yes    Comment: social use   Drug use: No   Sexual activity: Yes    Comment: gluten free, lives with husband and son with CP quadriplegia  Other Topics Concern   Not on file  Social History Narrative   Cares for son with cerebral palsy.    Lives at home with her husband and son.   Right-handed.   2 cups caffeine per day.   One story home   Social Determinants of Health   Financial Resource Strain: Low Risk  (10/19/2021)   Overall Financial Resource Strain (CARDIA)    Difficulty of Paying Living Expenses: Not hard at all  Food Insecurity: No Food Insecurity  (10/19/2021)   Hunger Vital Sign    Worried About Running Out of Food in the Last Year: Never true    Ran Out of Food in the Last Year: Never true  Transportation Needs: No Transportation Needs (10/19/2021)   PRAPARE - Hydrologist (Medical): No    Lack of Transportation (Non-Medical): No  Physical Activity: Insufficiently Active (10/19/2021)   Exercise Vital Sign    Days of Exercise per  Week: 7 days    Minutes of Exercise per Session: 20 min  Stress: Stress Concern Present (10/19/2021)   [place]    Feeling of Stress : Rather much  Social Connections: Moderately Integrated (10/19/2021)   Social Connection and Isolation Panel [NHANES]    Frequency of Communication with Friends and Family: More than three times a week    Frequency of Social Gatherings with Friends and Family: Once a week    Attends Religious Services: More than 4 times per year    Active Member of [employer] or Organizations: No    Attends Archivist Meetings: Never    Marital Status: Married  Human resources officer Violence: Not At Risk (10/19/2021)   Humiliation, Afraid, Rape, and Kick questionnaire    Fear of Current or Ex-Partner: No    Emotionally Abused: No    Physically Abused: No    Sexually Abused: No    Outpatient Medications Prior to Visit  Medication Sig Dispense Refill   albuterol (VENTOLIN HFA) 108 (90 Base) MCG/ACT inhaler Inhale 1-2 puffs into the lungs every 6 (six) hours as needed. 8 g 2   aspirin 81 MG tablet Take 81 mg by mouth daily.     Benralizumab (FASENRA PEN) 30 MG/ML SOAJ Inject 1 mL (30 mg total) into the skin every 8 (eight) weeks. 1 mL 2   Bioflavonoid Products (ESTER C PO) Take 500 mg by mouth daily.     Calcium-Magnesium-Vitamin D (CALCIUM MAGNESIUM PO) Take 1 tablet by mouth daily.     celecoxib (CELEBREX) 200 MG capsule One to 2 tablets by mouth daily as needed for pain. (Patient not taking:  Reported on 03/02/2022) 60 capsule 2   Coenzyme Q10 (CO Q 10 PO) Take by mouth daily.     diphenoxylate-atropine (LOMOTIL) 2.5-0.025 MG tablet Take 1 tablet by mouth 4 (four) times daily as needed for diarrhea or loose stools. 40 tablet 0   DULoxetine (CYMBALTA) 30 MG capsule 2 caps po q am and 1 cap q pm 120 capsule 5   Evolocumab (REPATHA SURECLICK) 892 MG/ML SOAJ INJECT 1 PEN INTO THE SKIN EVERY 14 DAYS (Patient taking differently: INJECT 1 PEN INTO THE SKIN EVERY 21 DAYS) 6 mL 3   famotidine (PEPCID) 20 MG tablet TAKE 1 TABLET BY MOUTH TWICE A DAY 60 tablet 2   ipratropium-albuterol (DUONEB) 0.5-2.5 (3) MG/3ML SOLN USE 1 VIAL BY NEBULIZATION EVERY 6 (SIX) HOURS AS NEEDED. 360 mL 5   LORazepam (ATIVAN) 1 MG tablet TAKE 1/2 TO 1 TABLET BY MOUTH 2 TIMES A DAY AS NEEDED FOR ANXIETY OR SLEEP 30 tablet 2   nitroGLYCERIN (NITROSTAT) 0.4 MG SL tablet PLACE 1 TABLET UNDER THE TONGUE EVERY 5 MINUTES AS NEEDED FOR CHEST PAIN. 25 tablet 8   OLANZapine (ZYPREXA) 5 MG tablet Take 1 tablet (5 mg total) by mouth at bedtime. 30 tablet 1   PROLIA 60 MG/ML SOSY injection SMARTSIG:60 Milligram(s) SUB-Q Once     VITAMIN D PO Take 5,000 Units by mouth daily.     HYDROcodone-acetaminophen (NORCO/VICODIN) 5-325 MG tablet Take 1 tablet by mouth every 8 (eight) hours as needed. (Patient not taking: Reported on 03/02/2022) 12 tablet 0   mesalamine (LIALDA) 1.2 g EC tablet Take 2 tablets (2.4 g total) by mouth daily with breakfast. 60 tablet 6   No facility-administered medications prior to visit.    Allergies  Allergen Reactions   Beclomethasone Dipropionate Hives and Other (See Comments)  weight gain   Flexeril [Cyclobenzaprine] Anxiety   Mometasone Furo-Formoterol Fum Hives and Other (See Comments)    weight gain   Sulfonamide Derivatives Hives and Rash   Statins     Myalgias, RLS    Review of Systems  Constitutional:  Positive for malaise/fatigue. Negative for fever.  HENT:  Negative for congestion.    Eyes:  Negative for blurred vision.  Respiratory:  Negative for shortness of breath.   Cardiovascular:  Negative for chest pain, palpitations and leg swelling.  Gastrointestinal:  Negative for abdominal pain, blood in stool and nausea.  Genitourinary:  Negative for dysuria and frequency.  Musculoskeletal:  Negative for falls.  Skin:  Negative for rash.  Neurological:  Negative for dizziness, loss of consciousness and headaches.  Endo/Heme/Allergies:  Negative for environmental allergies.  Psychiatric/Behavioral:  Positive for depression. The patient is nervous/anxious and has insomnia.        Objective:    Physical Exam Constitutional:      General: She is not in acute distress.    Appearance: Normal appearance. She is not ill-appearing or toxic-appearing.  HENT:     Head: Normocephalic and atraumatic.     Right Ear: External ear normal.     Left Ear: External ear normal.     Nose: Nose normal.  Eyes:     General:        Right eye: No discharge.        Left eye: No discharge.  Pulmonary:     Effort: Pulmonary effort is normal.  Skin:    Findings: No rash.  Neurological:     Mental Status: She is alert and oriented to person, place, and time.  Psychiatric:        Behavior: Behavior normal.     There were no vitals taken for this visit. Wt Readings from Last 3 Encounters:  03/02/22 154 lb 9.6 oz (70.1 kg)  01/25/22 152 lb 3.2 oz (69 kg)  01/05/22 149 lb (67.6 kg)    Diabetic Foot Exam - Simple   No data filed    Lab Results  Component Value Date   WBC 8.5 09/23/2021   HGB 12.9 09/23/2021   HCT 38.7 09/23/2021   PLT 303 09/23/2021   GLUCOSE 90 09/23/2021   CHOL 172 07/06/2021   TRIG 110.0 07/06/2021   HDL 55.30 07/06/2021   LDLDIRECT 177.8 09/07/2011   LDLCALC 95 07/06/2021   ALT 24 09/23/2021   AST 20 09/23/2021   NA 135 09/23/2021   K 4.2 09/23/2021   CL 99 09/23/2021   CREATININE 0.57 09/23/2021   BUN 21 09/23/2021   CO2 21 09/23/2021   TSH 1.82  07/06/2021   INR 1.0 08/21/2017   HGBA1C 5.5 07/06/2021    Lab Results  Component Value Date   TSH 1.82 07/06/2021   Lab Results  Component Value Date   WBC 8.5 09/23/2021   HGB 12.9 09/23/2021   HCT 38.7 09/23/2021   MCV 90 09/23/2021   PLT 303 09/23/2021   Lab Results  Component Value Date   NA 135 09/23/2021   K 4.2 09/23/2021   CO2 21 09/23/2021   GLUCOSE 90 09/23/2021   BUN 21 09/23/2021   CREATININE 0.57 09/23/2021   BILITOT <0.2 09/23/2021   ALKPHOS 63 09/23/2021   AST 20 09/23/2021   ALT 24 09/23/2021   PROT 6.3 09/23/2021   ALBUMIN 4.5 09/23/2021   CALCIUM 9.2 09/23/2021   ANIONGAP 6 08/24/2017   EGFR 101 09/23/2021  GFR 95.80 07/06/2021   Lab Results  Component Value Date   CHOL 172 07/06/2021   Lab Results  Component Value Date   HDL 55.30 07/06/2021   Lab Results  Component Value Date   LDLCALC 95 07/06/2021   Lab Results  Component Value Date   TRIG 110.0 07/06/2021   Lab Results  Component Value Date   CHOLHDL 3 07/06/2021   Lab Results  Component Value Date   HGBA1C 5.5 07/06/2021       Assessment & Plan:   Problem List Items Addressed This Visit     Sleep disorder due to stress (Chronic)    Encouraged good sleep hygiene such as dark, quiet room. No blue/green glowing lights such as computer screens in bedroom. No alcohol or stimulants in evening. Cut down on caffeine as able. Regular exercise is helpful but not just prior to bed time. Zyprexa has helped some, no changes today      Anxiety and depression    Doing much better with the decrease of Cymbalta to 90 mg and adding Zyprexa 5 mg qhs. Will continue same dosing for now. Spent 40 minutes discussing medications, current state and plan of care.        I have discontinued Tenna Child Macmurray's HYDROcodone-acetaminophen and mesalamine. I am also having her start on HYDROcodone bit-homatropine. Additionally, I am having her maintain her aspirin, Bioflavonoid Products (ESTER C PO),  VITAMIN D PO, albuterol, ipratropium-albuterol, Fasenra Pen, Calcium-Magnesium-Vitamin D (CALCIUM MAGNESIUM PO), famotidine, Prolia, diphenoxylate-atropine, celecoxib, Repatha SureClick, Coenzyme C37 (CO Q 10 PO), nitroGLYCERIN, LORazepam, DULoxetine, and OLANZapine.  Meds ordered this encounter  Medications   HYDROcodone bit-homatropine (HYCODAN) 5-1.5 MG/5ML syrup    Sig: Take 5 mLs by mouth every 8 (eight) hours as needed for cough.    Dispense:  180 mL    Refill:  0    I discussed the assessment and treatment plan with the patient. The patient was provided an opportunity to ask questions and all were answered. The patient agreed with the plan and demonstrated an understanding of the instructions.   The patient was advised to call back or seek an in-person evaluation if the symptoms worsen or if the condition fails to improve as anticipated.     [name], MD [facility] [phone] (phone) [phone] (fax)  [place]

## 2022-03-06 NOTE — Assessment & Plan Note (Signed)
Encouraged good sleep hygiene such as dark, quiet room. No blue/green glowing lights such as computer screens in bedroom. No alcohol or stimulants in evening. Cut down on caffeine as able. Regular exercise is helpful but not just prior to bed time. Zyprexa has helped some, no changes today

## 2022-03-07 ENCOUNTER — Ambulatory Visit: Payer: PPO | Admitting: Internal Medicine

## 2022-03-07 ENCOUNTER — Telehealth: Payer: Self-pay | Admitting: Family Medicine

## 2022-03-07 ENCOUNTER — Encounter: Payer: Self-pay | Admitting: Internal Medicine

## 2022-03-07 VITALS — BP 112/68 | HR 85 | Temp 97.6°F | Ht 61.0 in | Wt 154.4 lb

## 2022-03-07 DIAGNOSIS — Z23 Encounter for immunization: Secondary | ICD-10-CM

## 2022-03-07 DIAGNOSIS — K219 Gastro-esophageal reflux disease without esophagitis: Secondary | ICD-10-CM | POA: Diagnosis not present

## 2022-03-07 DIAGNOSIS — K449 Diaphragmatic hernia without obstruction or gangrene: Secondary | ICD-10-CM

## 2022-03-07 DIAGNOSIS — B4481 Allergic bronchopulmonary aspergillosis: Secondary | ICD-10-CM | POA: Diagnosis not present

## 2022-03-07 DIAGNOSIS — J4550 Severe persistent asthma, uncomplicated: Secondary | ICD-10-CM

## 2022-03-07 MED ORDER — ALBUTEROL SULFATE HFA 108 (90 BASE) MCG/ACT IN AERS: 1.0000 | INHALATION_SPRAY | Freq: Four times a day (QID) | RESPIRATORY_TRACT | 2 refills | Status: AC | PRN

## 2022-03-07 MED ORDER — IPRATROPIUM-ALBUTEROL 0.5-2.5 (3) MG/3ML IN SOLN: 3.0000 mL | Freq: Four times a day (QID) | RESPIRATORY_TRACT | 5 refills | Status: AC | PRN

## 2022-03-07 NOTE — Progress Notes (Signed)
OV 10/06/2016  Chief Complaint  Patient presents with   Follow-up    Pt states her asthma is good; had one flare up since last visit. SOB with exertion, cough with yellow to brown mucus prodcution, with frequent wheezing. Notice improvement with being on [employer]      66 year old female with severe asthma with ABPA and elevated IgE and eosinophilia. She is on IL5RAb antibody treatment now for the last few months. She feels this is helping her significantly. Her symptoms course of better. She is on Asmanex, Spiriva, Singulair as well. She is not on daily prednisone anymore. In December 2017 should she did grow MRSA from sputum culture. Currently a few days ago she developed sore throat and having some sputum production. She is worried this might be MRSA. Also asthma itself is stable and well controlled according to history. She is wondering about getting a sputum culture and really wishes for it    OV 01/11/2017  Chief Complaint  Patient presents with   Follow-up    Pt states her breathing is doing well. Pt states the last couple days she has had more wheezing. Pt states she has noticed an improvement being on Cinqair.    66 year old female with severe asthma with ABPA and elevated IgE and eosinophilia. She is on IL5RAb antibody treatment.  She is on Asmanex, Spiriva, Singulair as well. She is not on daily prednisone anymore   01/11/2017 - Here for routine follow-up. She is on interleukin-5 receptor antibody IV infusion therapy. This was held a few weeks ago because of asthma exacerbation. She says approximately on Father's Day 2018 he started feeling unwell and then ended up with an exacerbation for which she needed antibiotics and prednisone. She is surprised by this decompensation despite being on this biologic immunotherapy for asthma. She is compliant with her Asmanex, Spiriva and Singulair. She is not on prednisone. Currently she feels baseline. Patient infusion #6 or 7 is due for  tomorrow [interleukin-5 receptor antibody). Overall she feels improved quality of life. Despite all this exhaled nitric oxide continues to be HIGH; unclear why   OV 04/21/2017  Chief Complaint  Patient presents with   Follow-up    Pt states that she has been doing good. Has had no flare-ups with her asthma. Mild coughing at night. Denies any SOB or CP.  severe asthma with ABPA and elevated IgE and eosinophilia. She is on IL5RAb antibody treatment.  She is on Asmanex, Spiriva, Singulair as well. She is not on daily prednisone anymore  Continues cinqair spiriva, singulair, and asmanex. At this point in time the interleukin-5 receptor antibody is working really well for her. For nitric oxide today is lowest ever.. In terms of his symptoms she hardly ever gets up at night. When she wakes up she does not have any symptoms. She only has slight limitation with physical activity. She's not shortness of breath because of asthma not wheezing and not using albuterol for rescue use. The asthma control score is 0.8. However she is extremely frustrated by the [facility] gravida cycle management of her interleukin-5 receptor antibody infusions. She is getting different bills wrong bills missed bills. Therefore she wants to switch to subcutaneous injectable therapy. She is very upset about the quality of care that she has received at the infusion center because of billing practices.  OV 10/19/2017  Chief Complaint  Patient presents with   Follow-up    PFT done today. Pt states she has  been doing well. Has had a little more coughing and wheezing recently and thinks it could be due to the weather. Pt just got started back on Fasenra injections.   Severe persistent asthma with ABPA elevated IgE and eosinophilia. S/P cinqair interleukin-5 receptor antibody treatment ending" 2018 with good response but stopped secondary to billing errors at [facility] infusion center. Based on medical need then switched  to anti-eosinophil interleukin-5 receptor antibody subcutaneous injection treatment Fasenra early 2019. She is also on baseline Asmanex, Spiriva, Singulair and antihistamine 8. She is not on daily prednisone anymore.  Last seen November 2018. Overall she was doing well with good effort tolerance but in March 2019 had some atypical chest painand ended up with a coronary artery stent in the abuse marginal. She is improved from that. She is wondering about taking a beta blocker safely. She does not feel that his anemia because of blood pressure is low but apparently cardiology is asked her to clear this with me. In the interim her IV injection interleukin-5 receptor antibody has been changed to subcutaneous injection FASENRA . Because of her cardiac issues she's only taken 3 doses since January 2019. She hopes to go back on schedule with this. She had billing issues at the [facility] infusion center with the IV infusion and that is why we switched to the subcutaneous injection. However, it looks like the workflow authorization for this was not done correctly and she isn't of the large co-pay. She is unhappy about this and routinely discussed this with the patient. In terms of asthma she feels her cough is decompensated in the last few weeks and this correlates with her not taking her Asmanex. Her lung function test is near normal today. Her exhaled nitric oxide is slightly elevated than the lowest that she's had but still better than her baseline average. Currently she is waking up at nightcoughing because of the spring pollen but only hardly. Very mild symptoms when she wakes up she is slightly limited in the daytime and she experiences shortness of breath little of the time not using albuterol for rescue.    Results for RIHANNA, MARSEILLE (MRN [id number]) as of 10/19/2017 11:51  Ref. Range 10/19/2017 10:47  FEV1-Post Latest Units: L 1.80  FEV1-%Pred-Post Latest Units: % 76  FEV1-%Change-Post Latest Units: % 0   Results for NEKEISHA, AURE (MRN [id number]) as of 10/19/2017 11:51  Ref. Range 10/19/2017 10:47  TLC Latest Units: L 4.90  TLC % pred Latest Units: % 103  Results for LAREN, ORAMA (MRN [id number]) as of 10/19/2017 11:51  Ref. Range 10/19/2017 10:47  DLCO unc Latest Units: ml/min/mmHg 19.51  DLCO unc % pred Latest Units: % 90    OV 05/14/2018  Subjective:  Patient ID: [name], female , DOB: 02-21-1956 , age 65 y.o. , MRN: [id number] , ADDRESS: [place] [id number]   05/14/2018 -   Chief Complaint  Patient presents with   Follow-up    breathing good, SOB on exertion   Severe persistent asthma with ABPA elevated IgE and eosinophilia. S/P cinqair interleukin-5 receptor antibody treatment ending" 2018 with good response but stopped secondary to billing errors at [facility] infusion center. Based on medical need then switched to anti-eosinophil interleukin-5 receptor antibody subcutaneous injection treatment Fasenra early 2019. She is also on baseline Asmanex, Spiriva, Singulair and antihistamine 8. She is not on daily prednisone anymore.  Known hiatal hernia  HPI TRYNITY SKOUSEN 66 y.o. -returns for follow-up  of severe persistent asthma.  Earlier this year we switched her to interleukin-5 receptor antibody subcutaneous injection may be AstraZen[employer] called Fasenra.  With this she feels her asthma symptoms have improved dramatically.  She feels it is better than seeing care.  She still thankful to the scientist of developed this biologic.  Her exam nitric oxide is the lowest ever at 27.  She is not having any nighttime symptoms or any wheezing.  In fact she stopped her inhaled steroid partly because she was feeling well but partly also because she is in the donut hole.  However she is continuing her Spiriva and Singulair.  She is up-to-date with her flu shot.    She is dealing with skin infections.  Primary care physician notes reviewed.  There is question whether this is  being precipitated by the fact she is on to Biologics.  She thinks she had MRSA but when I reviewed the chart on May 08, 2018 shows MSSA.  Therefore she is been started on Augmentin today which she is yet to start.  She also has hiatal hernia.  She has not seen GI in a few years.  It is fairly well-controlled and she plans to see GI soon.  OV 02/14/2019  Subjective:  Patient ID: [name], female , DOB: September 22, 1955 , age 22 y.o. , MRN: [id number] , ADDRESS: [place] [address]   02/14/2019 -   Chief Complaint  Patient presents with   Severe persistent asthma with intensive monitoring   Severe persistent asthma with ABPA elevated IgE and eosinophilia. S/P cinqair interleukin-5 receptor antibody treatment ending" 2018 with good response but stopped secondary to billing errors at [facility] infusion center. Based on medical need then switched to anti-eosinophil interleukin-5 receptor antibody subcutaneous injection treatment Fasenra early 2019. She is  NOT on baseline Asmanex, Spiriva, Singulair and antihistamine 8. She is not on daily prednisone anymore.  Known hiatal hernia  HPI CORENE RESNICK 66 y.o. -is here for follow-up of asthma with ABPA.  Since her last visit she is only on biologic Fasenra.  She has stopped Singulair, Spiriva, Asmanex.  She is not taking daily prednisone.  Asthma is well controlled.  A few weeks ago she did have a itchy feeling intake epinephrine.  She also had wheeze at this time.  This helped control the situation.  Other than that she is doing well asthma is extremely well controlled.  No nocturnal awakenings no albuterol rescue use.  We are unable to do exhaled nitric oxide testing today because of the pandemic.  She told me that she wants to remove budesonide out of her allergy list because she is only allergic to [facility] and not the nebulizer.  She is doing social distancing.  She had an appointment earlier in the year with GI because of her  hiatal hernia but because of the pandemic she did not follow through.  She is open to having this appointment and referral made again.    OV 05/04/2020  Subjective:  Patient ID: [name], female , DOB: 1955-09-13 , age 87 y.o. , MRN: [id number] , ADDRESS: [place] [address] PCP Mosie Lukes, MD Patient Care Team: Mosie Lukes, MD as PCP - General (Family Medicine) Dorothy Spark, MD as PCP - Cardiology (Cardiology) Haverstock, Jennefer Bravo, MD as Referring Physician (Dermatology) Brand Males, MD as Consulting Physician (Pulmonary Disease) Dorothy Spark, MD as Consulting Physician (Cardiology) Dian Queen, MD as Consulting  Physician (Obstetrics and Gynecology) Melissa Montane, MD as Consulting Physician (Otolaryngology) Pyrtle, Lajuan Lines, MD as Consulting Physician (Gastroenterology) Pieter Partridge, DO as Consulting Physician (Neurology)  This Provider for this visit: Treatment Team:  Attending Provider: Brand Males, MD    05/04/2020 -   Chief Complaint  Patient presents with   Follow-up    Pt c/o cough since 1 day ago- prod with yellow sputum. She has also been wheezing some. She uses her duoneb 4-6 x per wk.      HPI ILY DENNO 66 y.o. -presents for follow-up of a complicated asthma with ABPA.  She has been overall doing well with [name].  However in September 2021 after being exposed to someone with Covid she had respiratory exacerbation but she was ruled out for Covid.  She took 5 days of prednisone and did well but she had a cough that lingered through October 2021.  Then she developed chest pains.  We did a CT angiogram that ruled out PE.  I was involved in the decision making.  It only showed hiatal hernia.  I personally visualized that CT again.  She can got worried that the [name] is not working because of that one respiratory exacerbation and the lingering cough.  In addition she tells me that yesterday while eating  some crackers she choked and today she is wheezing.  She is noted to be on alendronate for the last 2 years.  This is by primary care physician.  In the spring 2020 when she did see GI Dr. Hilarie Fredrickson and interventional endoscopy has been recommended but she is so far holding off on that.  Her acid reflux regimen has been escalated.  She is up-to-date with her vaccine but is inquiring about Covid booster timing.   Asthma Control Test ACT Total Score  05/04/2020 19     Lab Results  Component Value Date   NITRICOXIDE 42 04/21/2017     OV 06/04/2021  Subjective:  Patient ID: [name], female , DOB: 26-Jul-1955 , age 29 y.o. , MRN: [id number] , ADDRESS: [address] [place] [id number] PCP Mosie Lukes, MD Patient Care Team: Mosie Lukes, MD as PCP - General (Family Medicine) Dorothy Spark, MD (Inactive) as PCP - Cardiology (Cardiology) Haverstock, Jennefer Bravo, MD as Referring Physician (Dermatology) Brand Males, MD as Consulting Physician (Pulmonary Disease) Dorothy Spark, MD (Inactive) as Consulting Physician (Cardiology) Dian Queen, MD as Consulting Physician (Obstetrics and Gynecology) Melissa Montane, MD as Consulting Physician (Otolaryngology) Pyrtle, Lajuan Lines, MD as Consulting Physician (Gastroenterology) Pieter Partridge, DO as Consulting Physician (Neurology)  This Provider for this visit: Treatment Team:  Attending Provider: Brand Males, MD    06/04/2021 -   Chief Complaint  Patient presents with   Follow-up    Pt states she has been doing okay since last visit. States she has had sinus pressure and also some pain in sinuses.    Severe persistent asthma with ABPA elevated IgE and eosinophilia. S/P cinqair interleukin-5 receptor antibody treatment ending" 2018 with good response but stopped secondary to billing errors at [facility] infusion center. Based on medical need then switched to anti-eosinophil interleukin-5 receptor antibody  subcutaneous injection treatment Fasenra early 2019. She is  NOT on baseline Asmanex, Spiriva, Singulair and antihistamine 8. She is not on daily prednisone anymore.  Known hiatal hernia  HPI KARRINE KLUTTZ 66 y.o. -returns for follow-up.  Is a routine follow-up.  At this point in time she  is on biologic Fasenra.  She is not taking any of her inhalers or Singulair.  She did see Tammy.  In the summer 2022 had an exacerbation had to go on prednisone.  Personally saw her a year ago.  Currently asthma is well controlled.  She says she has intermittent wheezing and is wondering if she should be on a maintenance inhaler.  She asked me about evidence in literature about monotherapy with [name].  Explained to her that Biologics was studied as add-on therapy and she needs to be on a maintenance therapy.  She is agreeable to go back on her Asmanex.  This because she does have some occasional wheezing. ACT score shows good control  Of note she is having chronic headaches.  She has been on [place] since 2019.  Prior to that she was started on Repatha.  She is also on Prolia These can cause headaches.    She had CT angio which shows stable hiatale hernia.   Asthma Control Test ACT Total Score  06/04/2021 23  03/17/2021 16  05/04/2020 19    Lab Results  Component Value Date   NITRICOXIDE 42 04/21/2017     CT Chest data  No results found.      OV 09/06/2021  Subjective:  Patient ID: [name], female , DOB: April 19, 1956 , age 27 y.o. , MRN: [id number] , ADDRESS: [address] [place] [id number] PCP Mosie Lukes, MD Patient Care Team: Mosie Lukes, MD as PCP - General (Family Medicine) Dorothy Spark, MD as PCP - Cardiology (Cardiology) Haverstock, Jennefer Bravo, MD as Referring Physician (Dermatology) Brand Males, MD as Consulting Physician (Pulmonary Disease) Dorothy Spark, MD as Consulting Physician (Cardiology) Dian Queen, MD as Consulting  Physician (Obstetrics and Gynecology) Melissa Montane, MD as Consulting Physician (Otolaryngology) Pyrtle, Lajuan Lines, MD as Consulting Physician (Gastroenterology) Pieter Partridge, DO as Consulting Physician (Neurology)  This Provider for this visit: Treatment Team:  Attending Provider: Brand Males, MD    09/06/2021 -   Chief Complaint  Patient presents with   Follow-up    Pt denies any flare ups with her asthma since last visit. States that she does use her rescue inhaler when she needs it. States that she is still having pain in her sternum and ribs and in back which she has had since last visit.     Severe persistent asthma with ABPA elevated IgE and eosinophilia. S/P cinqair interleukin-5 receptor antibody treatment ending" 2018 with good response but stopped secondary to billing errors at [facility] infusion center. Based on medical need then switched to anti-eosinophil interleukin-5 receptor antibody subcutaneous injection treatment Fasenra early 2019. She is  NOT on baseline Asmanex, Spiriva, Singulair and antihistamine 8. She is not on daily prednisone anymore.  Known hiatal hernia   HPI KENDAHL BUMGARDNER 66 y.o. -returns for her 5-monthfollow-up.  Asthma itself is well controlled.  However she is dealing with significant spinal issues.  She follows with Dr. HJeannine Kittenat a clinic in [place].  She tells me that her scoliosis is worse and the kyphosis is worse.  She is lost 1.25 inches in height and the kyphosis is worse by 13 degrees.  Associated with that she is having persistent left rib cage pain and also sternal pain.  She states at 1 point on 08/15/2021 the pain was so bad she went to the ER and called EMS.  She has been treated with meloxicam.  She also saw Dr. SRosiland Ozand's  sports medicine is being given left hip injection.  She is attended massage therapy on 08/27/2021 and periodically is used walker.  Currently somewhat better.  She has had CT scans.  She also had ANA that was positive on  09/03/2021.  The results available between 1: 80 and 1: 320.  Apparently rheumatology referral is in place.  Asthma and hiatal hernia itself under control.  She is quite bothered by the pain.  This is despite physical therapy.  She is frustrated by this.      Results for SHAELEIGH, GRAW (MRN [id number]) as of 05/14/2018 10:52  Ref. Range 05/05/2015 13:45 07/17/2015 12:31 11/24/2015 12:31 10/06/2016 12:00 01/11/2017 12:54 04/21/2017 11:02 05/14/2018   Nitric Oxide Unknown 80 80 126 74 87 42 27     Latest Reference Range & Units 09/03/21 15:02  ANA PATTERN  Nuclear, Few Nuclear Dots !  ANA TITER titer 1:320 (H)  !: Data is abnormal (H): Data is abnormally high   Latest Reference Range & Units 09/04/12 14:25 09/03/21 15:02  Anti Nuclear Antibody (ANA) NEGATIVE   POSITIVE !  ANA Pattern 1   Nuclear, Dense Fine Speckled !  ANA Titer 1 titer  1:80 (H)  RA Latex Turbid. <14 IU/mL <10 <14  !: Data is abnormal (H): Data is abnormally highh  OV 03/07/2022  Subjective:  Patient ID: [name], female , DOB: 1956/03/16 , age 32 y.o. , MRN: [id number] , ADDRESS: [address] [place] [address] PCP Mosie Lukes, MD Patient Care Team: Mosie Lukes, MD as PCP - General (Family Medicine) Dorothy Spark, MD as PCP - Cardiology (Cardiology) Haverstock, Jennefer Bravo, MD as Referring Physician (Dermatology) Brand Males, MD as Consulting Physician (Pulmonary Disease) Dorothy Spark, MD as Consulting Physician (Cardiology) Dian Queen, MD as Consulting Physician (Obstetrics and Gynecology) Melissa Montane, MD as Consulting Physician (Otolaryngology) Pyrtle, Lajuan Lines, MD as Consulting Physician (Gastroenterology) Pieter Partridge, DO as Consulting Physician (Neurology)  This Provider for this visit: Treatment Team:  Attending Provider: Brand Males, MD    03/07/2022 -   Chief Complaint  Patient presents with   Follow-up    Pt states she had covid about 3 weeks ago  and states she still has a cough that she cannot get rid of.    Severe persistent asthma with ABPA elevated IgE and eosinophilia. S/P cinqair interleukin-5 receptor antibody treatment ending" 2018 with good response but stopped secondary to billing errors at [facility] infusion center. Based on medical need then switched to anti-eosinophil interleukin-5 receptor antibody subcutaneous injection treatment Fasenra early 2019. She is  NOT on baseline Asmanex, Spiriva, Singulair and antihistamine 8. She is not on daily prednisone anymore.  Known hiatal hernia  ANA positive - saw Dr Estanislado Pandy Sept 2023 and reassured    HPI MAKYNLIE ROSSINI 66 y.o. -returns for follow-up.  She continues on [place].  Last visit I reintroduced inhaled corticosteroid Asmanex but for some reason she is not on it.  We went over the rationale for Biologics which is failed inhaler therapy.  Did express concern that if she is not on inhaler therapy that her [name] could be denied in the future.  She is going to restart Asmanex.  Currently she does not have any acid reflux symptoms.  Her back pain is better after she got new orthotics that increases her shoe height.  The only issue is that 3 weeks ago she had COVID-19.  She did take antiviral and then she  got better.  She did have some residual cough but primary care physician has given her some Hydromet.  She says the cough is working its way out.  There are no other new issues.  She will have a flu shot today.     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), Anemia, Anxiety, Asthma, CAD (coronary artery disease), CAP (community acquired pneumonia) (2016; 06/07/2016), Chronic bronchitis ([place]), Chronic lower back pain, Complication of anesthesia, COPD (chronic obstructive pulmonary disease) ([place]), Depression, Diverticulitis, Diverticulosis, GERD (gastroesophageal reflux disease), H/O hiatal hernia, Headache, History of echocardiogram, Hyperglycemia (11/20/2015), Hyperlipidemia, mixed (09/11/2007), IBS (irritable bowel syndrome), Maxillary sinusitis, Normal cardiac stress test (11/2011), Obesity, OSA (obstructive sleep apnea) (02/2012), Osteoarthritis, Osteoporosis, Pneumonia (11/2011), Pulmonary nodules, S/P angioplasty with stent 08/23/17 ostial 2nd OM with DES synnergy (08/24/2017), and Schatzki's ring.   reports that she has never smoked. She has never been exposed to tobacco smoke. She has never used smokeless tobacco.  Past Surgical History:  Procedure Laterality Date   [place] N/A 11/25/2014   Procedure: Right/Left Heart Cath and Coronary Angiography;  Surgeon: Belva Crome, MD;  Location: [place] [facility];  Service: Cardiovascular;  Laterality: N/A;   [place] WITH STENT PLACEMENT  08/23/2017   CORONARY STENT INTERVENTION N/A 08/23/2017   Procedure: CORONARY STENT INTERVENTION;  Surgeon: Burnell Blanks, MD;  Location: [place] [facility];  Service: Cardiovascular;  Laterality: N/A;   HERNIA REPAIR  04/13/2012   VHR laparoscopic   LEFT HEART CATH AND CORONARY ANGIOGRAPHY N/A 08/23/2017   Procedure: LEFT HEART CATH AND CORONARY ANGIOGRAPHY;  Surgeon: Burnell Blanks, MD;  Location: [place] CV LAB;  Service: Cardiovascular;  Laterality: N/A;   VENTRAL HERNIA REPAIR  04/13/2012   Procedure: LAPAROSCOPIC VENTRAL HERNIA;  Surgeon: Adin Hector, MD;  Location: [place];  Service: General;  Laterality: N/A;  laparoscopic repair of  incarcerated hernia    Allergies  Allergen Reactions   Beclomethasone Dipropionate Hives and Other (See Comments)     weight gain   Flexeril [Cyclobenzaprine] Anxiety   Mometasone Furo-Formoterol Fum Hives and Other (See Comments)    weight gain   Sulfonamide Derivatives Hives and Rash   Statins     Myalgias, RLS    Immunization History  Administered Date(s) Administered   Influenza Split 04/20/2011, 04/20/2020   Influenza Whole 06/06/2007, 04/15/2008, 04/02/2009, 03/29/2012   Influenza, High Dose Seasonal PF 05/05/2015   Influenza,inj,Quad PF,6+ Mos 05/09/2013, 03/03/2014, 05/03/2016, 04/21/2017, 04/02/2018, 03/19/2019, 04/20/2020, 05/03/2021, 03/07/2022   Moderna Sars-Covid-2 Vaccination 08/15/2019, 09/17/2019, 05/16/2020, 12/04/2020   Pfizer Covid-19 Vaccine Bivalent Booster 32yr & up 04/16/2021   Pneumococcal Conjugate-13 05/09/2013   Pneumococcal Polysaccharide-23 05/04/2005   Td 07/29/2009   Tdap 03/11/2015   Zoster Recombinat (  Shingrix) 05/09/2019, 08/01/2019    Family History  Problem Relation Age of Onset   Breast cancer Mother    Hypertension Mother    Diabetes Mother    Cancer Mother        recurrent, metastatic breast cancer.    Diverticulosis Father    Prostate cancer Father    Breast cancer Sister    Cancer Sister        breast cancer, invasive ductal carcinoma in 2022,DCIS at 83 with 4 weeks of radiation, 5 years of Tamoxifen    Pulmonary embolism Brother        recurrent   Heart attack Maternal Grandfather    Cerebral palsy Son    Cancer Niece 8   Osteoporosis Niece    Stroke Neg Hx    Colon cancer Neg Hx    Esophageal cancer Neg Hx    Stomach cancer Neg Hx    Rectal cancer Neg Hx      Current Outpatient Medications:    aspirin 81 MG tablet, Take 81 mg by mouth daily., Disp: , Rfl:    Benralizumab (FASENRA PEN) 30 MG/ML SOAJ, Inject 1 mL (30 mg total) into the skin every 8 (eight) weeks., Disp: 1 mL, Rfl: 2   Bioflavonoid Products (ESTER C  PO), Take 500 mg by mouth daily., Disp: , Rfl:    Calcium-Magnesium-Vitamin D (CALCIUM MAGNESIUM PO), Take 1 tablet by mouth daily., Disp: , Rfl:    Coenzyme Q10 (CO Q 10 PO), Take by mouth daily., Disp: , Rfl:    diphenoxylate-atropine (LOMOTIL) 2.5-0.025 MG tablet, Take 1 tablet by mouth 4 (four) times daily as needed for diarrhea or loose stools., Disp: 40 tablet, Rfl: 0   DULoxetine (CYMBALTA) 30 MG capsule, 2 caps po q am and 1 cap q pm, Disp: 120 capsule, Rfl: 5   Evolocumab (REPATHA SURECLICK) 195 MG/ML SOAJ, INJECT 1 PEN INTO THE SKIN EVERY 14 DAYS (Patient taking differently: INJECT 1 PEN INTO THE SKIN EVERY 21 DAYS), Disp: 6 mL, Rfl: 3   famotidine (PEPCID) 20 MG tablet, TAKE 1 TABLET BY MOUTH TWICE A DAY, Disp: 60 tablet, Rfl: 2   HYDROcodone bit-homatropine (HYCODAN) 5-1.5 MG/5ML syrup, Take 5 mLs by mouth every 8 (eight) hours as needed for cough., Disp: 180 mL, Rfl: 0   LORazepam (ATIVAN) 1 MG tablet, TAKE 1/2 TO 1 TABLET BY MOUTH 2 TIMES A DAY AS NEEDED FOR ANXIETY OR SLEEP, Disp: 30 tablet, Rfl: 2   nitroGLYCERIN (NITROSTAT) 0.4 MG SL tablet, PLACE 1 TABLET UNDER THE TONGUE EVERY 5 MINUTES AS NEEDED FOR CHEST PAIN., Disp: 25 tablet, Rfl: 8   OLANZapine (ZYPREXA) 5 MG tablet, Take 1 tablet (5 mg total) by mouth at bedtime., Disp: 30 tablet, Rfl: 1   PROLIA 60 MG/ML SOSY injection, SMARTSIG:60 Milligram(s) SUB-Q Once, Disp: , Rfl:    VITAMIN D PO, Take 5,000 Units by mouth daily., Disp: , Rfl:    albuterol (VENTOLIN HFA) 108 (90 Base) MCG/ACT inhaler, Inhale 1-2 puffs into the lungs every 6 (six) hours as needed., Disp: 8 g, Rfl: 2   ipratropium-albuterol (DUONEB) 0.5-2.5 (3) MG/3ML SOLN, Take 3 mLs by nebulization every 6 (six) hours as needed., Disp: 120 mL, Rfl: 5      Objective:   Vitals:   03/07/22 1129  BP: 112/68  Pulse: 85  Temp: 97.6 F (36.4 C)  TempSrc: Oral  SpO2: 100%  Weight: 154 lb 6.4 oz (70 kg)  Height: 5' 1"  (1.549 m)  Estimated body mass index is  29.17 kg/m as calculated from the following:   Height as of this encounter: 5' 1"  (1.549 m).   Weight as of this encounter: 154 lb 6.4 oz (70 kg).  @WEIGHTCHANGE @  [employer]   03/07/22 1129  Weight: 154 lb 6.4 oz (70 kg)     Physical Exam General: No distress. Looks well. SCOLIOSIS + Neuro: Alert and Oriented x 3. GCS 15. Speech normal Psych: Pleasant Resp:  Barrel Chest - no.  Wheeze - no, Crackles - no, No overt respiratory distress CVS: Normal heart sounds. Murmurs - no Ext: Stigmata of Connective Tissue Disease - no HEENT: Normal upper airway. PEERL +. No post nasal drip        Assessment:       ICD-10-CM   1. Severe persistent asthma with intensive monitoring  J45.50     2. A B P A-ALLERGIC BRONCHOPULMONARY ASPERGILLOSIS  B44.81     3. Hiatal hernia  K44.9     4. Gastroesophageal reflux disease, unspecified whether esophagitis present  K21.9     5. Need for immunization against influenza  Z23 Flu Vaccine QUAD 79moIM (Fluarix, Fluzone & Alfiuria Quad PF)         Plan:     Patient Instructions   Severe persistent asthma with intensive monitoring A B P A-ALLERGIC BRONCHOPULMONARY ASPERGILLOSIS Moderate persistent asthmatic bronchitis with acute exacerbation  -At this point in time there is no evidence of Fasenra failure.  Asthma is well controlled - recent covid and post viral cough is resolving  Plan -continue with Fasenra  - BUT   retstart asmanex 2 puff twice daily - CMA to do refill - continue albuterol as needed - flu shot 03/07/2022 - RSV vaccine in a few weeks  - probably can get away without covid mRNA booster due to recent natural immunity   Hiatal hernia  Stable on CT chest oct 2022 Glad you are off fosfamax  Plan -per pcp BMosie Lukes MD and Dr PLevie Heritage ANA positive  Glad rheum Dr DEstanislado Pandysaw you in sept 2023 and reassured  Plan   Thoracic and lumbar an d hip pain  - prob due to kyphoscoliosis and improved with new  orthotics  Plan   -per your spine team   Followup 6 months or sooner if needed    SIGNATURE    Dr. MBrand Males M.D., F.C.C.P,  Pulmonary and Critical Care Medicine Staff Physician, [facility]stonDirector - Interstitial Lung Disease  Program  [place] [address] Pager: [phone] If no answer or between  15:00h - 7:00h: call [phone] Telephone: [phone]  12:32 PM 03/07/2022

## 2022-03-07 NOTE — Telephone Encounter (Signed)
Submitted request to flex forward for bilateral zilretta injections. Uploaded last office note.

## 2022-03-07 NOTE — Patient Instructions (Addendum)
  Severe persistent asthma with intensive monitoring A B P A-ALLERGIC BRONCHOPULMONARY ASPERGILLOSIS Moderate persistent asthmatic bronchitis with acute exacerbation  -At this point in time there is no evidence of Fasenra failure.  Asthma is well controlled - recent covid and post viral cough is resolving  Plan -continue with Fasenra  - BUT   retstart asmanex 2 puff twice daily - CMA to do refill - continue albuterol as needed - flu shot 03/07/2022 - RSV vaccine in a few weeks  - probably can get away without covid mRNA booster due to recent natural immunity   Hiatal hernia  Stable on CT chest oct 2022 Glad you are off fosfamax  Plan -per pcp Mosie Lukes, MD and Dr Levie Heritage  ANA positive  Glad rheum Dr Estanislado Pandy saw you in sept 2023 and reassured  Plan   Thoracic and lumbar an d hip pain  - prob due to kyphoscoliosis and improved with new orthotics  Plan   -per your spine team   Followup 6 months or sooner if needed

## 2022-03-08 DIAGNOSIS — N8189 Other female genital prolapse: Secondary | ICD-10-CM | POA: Diagnosis not present

## 2022-03-08 DIAGNOSIS — Z6829 Body mass index (BMI) 29.0-29.9, adult: Secondary | ICD-10-CM | POA: Diagnosis not present

## 2022-03-08 DIAGNOSIS — Z124 Encounter for screening for malignant neoplasm of cervix: Secondary | ICD-10-CM | POA: Diagnosis not present

## 2022-03-08 DIAGNOSIS — Z1231 Encounter for screening mammogram for malignant neoplasm of breast: Secondary | ICD-10-CM | POA: Diagnosis not present

## 2022-03-08 NOTE — Telephone Encounter (Signed)
Left message for patient to call back re:   [place] benefit summary states patient will owe 20% for J3304  (zilretta) ((approx $250 per knee)) with the remaining 80% covered by plan. Patient owes a $25 copay for CPT 20610. If $3200 OOP is met, coverage goes to 100%.

## 2022-03-10 DIAGNOSIS — R29898 Other symptoms and signs involving the musculoskeletal system: Secondary | ICD-10-CM | POA: Diagnosis not present

## 2022-03-10 DIAGNOSIS — I8390 Asymptomatic varicose veins of unspecified lower extremity: Secondary | ICD-10-CM | POA: Diagnosis not present

## 2022-03-11 NOTE — Telephone Encounter (Signed)
Pt informed of below.  She will call us back when she decides what she needs and wants to do.

## 2022-03-12 ENCOUNTER — Encounter: Payer: Self-pay | Admitting: Internal Medicine

## 2022-03-14 ENCOUNTER — Encounter: Payer: Self-pay | Admitting: Internal Medicine

## 2022-03-16 DIAGNOSIS — M4124 Other idiopathic scoliosis, thoracic region: Secondary | ICD-10-CM | POA: Diagnosis not present

## 2022-03-16 DIAGNOSIS — M25552 Pain in left hip: Secondary | ICD-10-CM | POA: Diagnosis not present

## 2022-03-16 DIAGNOSIS — M9901 Segmental and somatic dysfunction of cervical region: Secondary | ICD-10-CM | POA: Diagnosis not present

## 2022-03-16 DIAGNOSIS — M9902 Segmental and somatic dysfunction of thoracic region: Secondary | ICD-10-CM | POA: Diagnosis not present

## 2022-03-16 DIAGNOSIS — M4004 Postural kyphosis, thoracic region: Secondary | ICD-10-CM | POA: Diagnosis not present

## 2022-03-17 LAB — HMGCR AB (IGG): HMGCR AB (IGG): <2 CU (ref <20)

## 2022-03-17 LAB — MYOSITIS SPECIFIC II ANTIBODIES PANEL
EJ AB: <11 SI (ref <11)
JO-1 AB: <11 SI (ref <11)
MDA-5 AB: <11 SI (ref <11)
MI-2 ALPHA AB: <11 SI (ref <11)
MI-2 BETA AB: <11 SI (ref <11)
NXP-2 AB: <11 SI (ref <11)
OJ AB: <11 SI (ref <11)
PL-12 AB: <11 SI (ref <11)
PL-7 AB: <11 SI (ref <11)
SRP-AB: <11 SI (ref <11)
TIF-1y AB: <11 SI (ref <11)

## 2022-03-17 LAB — ALDOLASE: Aldolase: 5.8 U/L (ref <=8.1)

## 2022-03-17 LAB — CK: Total CK: 351 U/L — ABNORMAL HIGH (ref 29–143)

## 2022-03-17 NOTE — Progress Notes (Signed)
I called patient and discussed that elevated CK is most likely  related to statin use in the past.  We will recheck level in 1 year.  Aldolase, myositis panel and HMG CR antibodies were all within normal limits.  Patient does not have any muscle weakness or tenderness.

## 2022-03-20 HISTORY — PX: COLONOSCOPY: SHX174

## 2022-03-21 ENCOUNTER — Encounter: Payer: Self-pay | Admitting: Internal Medicine

## 2022-03-21 ENCOUNTER — Ambulatory Visit (AMBULATORY_SURGERY_CENTER): Payer: PPO | Admitting: Internal Medicine

## 2022-03-21 ENCOUNTER — Other Ambulatory Visit: Payer: Self-pay | Admitting: Internal Medicine

## 2022-03-21 VITALS — BP 114/64 | HR 70 | Temp 98.4°F | Resp 11 | Ht 61.0 in | Wt 149.0 lb

## 2022-03-21 DIAGNOSIS — R197 Diarrhea, unspecified: Secondary | ICD-10-CM | POA: Diagnosis not present

## 2022-03-21 DIAGNOSIS — K52832 Lymphocytic colitis: Secondary | ICD-10-CM

## 2022-03-21 DIAGNOSIS — K633 Ulcer of intestine: Secondary | ICD-10-CM

## 2022-03-21 DIAGNOSIS — K529 Noninfective gastroenteritis and colitis, unspecified: Secondary | ICD-10-CM

## 2022-03-21 DIAGNOSIS — I2510 Atherosclerotic heart disease of native coronary artery without angina pectoris: Secondary | ICD-10-CM | POA: Diagnosis not present

## 2022-03-21 DIAGNOSIS — K6389 Other specified diseases of intestine: Secondary | ICD-10-CM | POA: Diagnosis not present

## 2022-03-21 DIAGNOSIS — F32A Depression, unspecified: Secondary | ICD-10-CM | POA: Diagnosis not present

## 2022-03-21 DIAGNOSIS — G4733 Obstructive sleep apnea (adult) (pediatric): Secondary | ICD-10-CM | POA: Diagnosis not present

## 2022-03-21 DIAGNOSIS — J449 Chronic obstructive pulmonary disease, unspecified: Secondary | ICD-10-CM | POA: Diagnosis not present

## 2022-03-21 DIAGNOSIS — F419 Anxiety disorder, unspecified: Secondary | ICD-10-CM | POA: Diagnosis not present

## 2022-03-21 MED ORDER — SODIUM CHLORIDE 0.9 % IV SOLN: 500.0000 mL | INTRAVENOUS | Status: DC

## 2022-03-21 NOTE — Progress Notes (Signed)
Called to room to assist during endoscopic procedure.  Patient ID and intended procedure confirmed with present staff. Received instructions for my participation in the procedure from the performing physician.  

## 2022-03-21 NOTE — Progress Notes (Signed)
GASTROENTEROLOGY PROCEDURE H&P NOTE   Primary Care Physician: Mosie Lukes, MD    Reason for Procedure:  Evaluation of inflammatory diarrhea  Plan:    Colonoscopy  Patient is appropriate for endoscopic procedure(s) in the ambulatory ([facility]) setting.  The nature of the procedure, as well as the risks, benefits, and alternatives were carefully and thoroughly reviewed with the patient. Ample time for discussion and questions allowed. The patient understood, was satisfied, and agreed to proceed.     HPI: Victoria [name] is a 66 y.o. female who presents for colonoscopy.  Medical history as below.  Tolerated the prep.  No recent chest pain or shortness of breath.  No abdominal pain today.  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 LHC 6/16:  oOM1 60, pRCA 25 >> med Rx b. cath 3/19 2nd OM with 95% stenosis s/p synergy DES & anomalous RCA   CAP (community acquired pneumonia) 2016; 06/07/2016   Chronic bronchitis (HCC)    Chronic lower back pain    Complication of anesthesia    "think I have a hard time waking up from it"   COPD (chronic obstructive pulmonary disease) ([place])    Depression    mild   Diverticulitis    Diverticulosis    GERD (gastroesophageal reflux disease)    H/O hiatal hernia    Headache    "weekly" (08/23/2017)   History of echocardiogram    Echo 6/16:  Mod LVH, EF 60-65%, no RWMA, Gr 1 DD, trivial MR, normal LA size.   Hyperglycemia 11/20/2015   Hyperlipidemia, mixed 09/11/2007   Qualifier: Diagnosis of  By: Jerold Coombe   Did not tolerate Lipitor, zocor, Lovastatin, Pravastatin, Livalo, Crestor even low dose    IBS (irritable bowel syndrome)    Maxillary sinusitis    Normal cardiac stress test 11/2011   No evidence of ischemia or infarct.   Calculated ejection fraction  72%.   Obesity    OSA (obstructive sleep apnea) 02/2012   has stopped using  cpap   Osteoarthritis    Osteoporosis    Pneumonia 11/2011   "before 2013 I hadn't had pneumonia since I was a child" (04/13/2012)   Pulmonary nodules    S/P angioplasty with stent 08/23/17 ostial 2nd OM with DES synnergy 08/24/2017   Schatzki's ring     Past Surgical History:  Procedure Laterality Date   APPENDECTOMY  1989   CARDIAC CATHETERIZATION N/A 11/25/2014   Procedure: Right/Left Heart Cath and Coronary Angiography;  Surgeon: Belva Crome, MD;  Location: [facility];  Service: Cardiovascular;  Laterality: N/A;   [place] WITH STENT PLACEMENT  08/23/2017   CORONARY STENT INTERVENTION N/A 08/23/2017   Procedure: CORONARY STENT INTERVENTION;  Surgeon: Burnell Blanks, MD;  Location: [facility];  Service: Cardiovascular;  Laterality: N/A;   HERNIA REPAIR  04/13/2012   VHR laparoscopic   LEFT HEART CATH AND CORONARY ANGIOGRAPHY N/A 08/23/2017   Procedure: LEFT HEART CATH AND CORONARY ANGIOGRAPHY;  Surgeon: Burnell Blanks, MD;  Location: [facility];  Service: Cardiovascular;  Laterality: N/A;   VENTRAL HERNIA REPAIR  04/13/2012   Procedure: LAPAROSCOPIC VENTRAL HERNIA;  Surgeon: Adin Hector, MD;  Location:  [facility];  Service: General;  Laterality: N/A;  laparoscopic repair of incarcerated hernia    Prior to Admission medications   Medication Sig Start Date End Date Taking? Authorizing Provider  Calcium-Magnesium-Vitamin D (CALCIUM MAGNESIUM PO) Take 1 tablet by mouth daily.   Yes [provider]  Coenzyme Q10 (CO Q 10 PO) Take by mouth daily.   Yes [provider]  diphenoxylate-atropine (LOMOTIL) 2.5-0.025 MG tablet Take 1 tablet by mouth 4 (four) times daily as needed for diarrhea or loose stools. 11/25/21  Yes Mosie Lukes, MD  DULoxetine (CYMBALTA) 30 MG capsule 2 caps po q am and 1 cap q pm 02/18/22  Yes Mosie Lukes, MD  famotidine (PEPCID) 20 MG tablet TAKE 1 TABLET BY MOUTH TWICE A DAY 11/17/21  Yes Willia Craze, NP  LORazepam (ATIVAN) 1 MG tablet TAKE 1/2 TO 1 TABLET BY MOUTH 2 TIMES A DAY AS NEEDED FOR ANXIETY OR SLEEP 02/14/22  Yes Mosie Lukes, MD  nitroGLYCERIN (NITROSTAT) 0.4 MG SL tablet PLACE 1 TABLET UNDER THE TONGUE EVERY 5 MINUTES AS NEEDED FOR CHEST PAIN. 02/04/22  Yes Pemberton, Greer Ee, MD  OLANZapine (ZYPREXA) 5 MG tablet Take 1 tablet (5 mg total) by mouth at bedtime. 02/18/22  Yes Mosie Lukes, MD  VITAMIN D PO Take 5,000 Units by mouth daily.   Yes [provider]  albuterol (VENTOLIN HFA) 108 (90 Base) MCG/ACT inhaler Inhale 1-2 puffs into the lungs every 6 (six) hours as needed. 03/07/22   Brand Males, MD  aspirin 81 MG tablet Take 81 mg by mouth daily.    [provider]  Benralizumab (FASENRA PEN) 30 MG/ML SOAJ Inject 1 mL (30 mg total) into the skin every 8 (eight) weeks. 07/26/21   Brand Males, MD  Bioflavonoid Products (ESTER C PO) Take 500 mg by mouth daily.    [provider]  Evolocumab (REPATHA SURECLICK) 010 MG/ML SOAJ INJECT 1 PEN INTO THE SKIN EVERY 14 DAYS Patient taking differently: INJECT 1 PEN INTO THE SKIN EVERY 21 DAYS 01/06/22   Freada Bergeron, MD  HYDROcodone bit-homatropine (HYCODAN) 5-1.5 MG/5ML syrup Take 5 mLs by mouth every 8 (eight) hours as needed for cough. 03/04/22   Mosie Lukes, MD  ipratropium-albuterol (DUONEB) 0.5-2.5 (3) MG/3ML SOLN Take 3 mLs by nebulization every 6 (six) hours as needed. 03/07/22   Brand Males, MD  PROLIA 60 MG/ML SOSY injection SMARTSIG:60 Milligram(s) SUB-Q Once 11/17/21   [provider]    Current Outpatient Medications  Medication Sig Dispense Refill   Calcium-Magnesium-Vitamin D (CALCIUM MAGNESIUM PO) Take 1 tablet by mouth daily.     Coenzyme Q10 (CO Q 10 PO) Take by mouth daily.     diphenoxylate-atropine (LOMOTIL) 2.5-0.025 MG tablet Take 1 tablet by  mouth 4 (four) times daily as needed for diarrhea or loose stools. 40 tablet 0   DULoxetine (CYMBALTA) 30 MG capsule 2 caps po q am and 1 cap q pm 120 capsule 5   famotidine (PEPCID) 20 MG tablet TAKE 1 TABLET BY MOUTH TWICE A DAY 60 tablet 2   LORazepam (ATIVAN) 1 MG tablet TAKE 1/2 TO 1 TABLET BY MOUTH 2 TIMES A DAY AS NEEDED FOR ANXIETY OR SLEEP 30 tablet 2   nitroGLYCERIN (NITROSTAT) 0.4 MG SL tablet PLACE 1 TABLET UNDER THE TONGUE EVERY 5 MINUTES AS NEEDED FOR CHEST PAIN. 25 tablet 8   OLANZapine (ZYPREXA) 5 MG tablet Take 1 tablet (5 mg total) by mouth  at bedtime. 30 tablet 1   VITAMIN D PO Take 5,000 Units by mouth daily.     albuterol (VENTOLIN HFA) 108 (90 Base) MCG/ACT inhaler Inhale 1-2 puffs into the lungs every 6 (six) hours as needed. 8 g 2   aspirin 81 MG tablet Take 81 mg by mouth daily.     Benralizumab (FASENRA PEN) 30 MG/ML SOAJ Inject 1 mL (30 mg total) into the skin every 8 (eight) weeks. 1 mL 2   Bioflavonoid Products (ESTER C PO) Take 500 mg by mouth daily.     Evolocumab (REPATHA SURECLICK) 397 MG/ML SOAJ INJECT 1 PEN INTO THE SKIN EVERY 14 DAYS (Patient taking differently: INJECT 1 PEN INTO THE SKIN EVERY 21 DAYS) 6 mL 3   HYDROcodone bit-homatropine (HYCODAN) 5-1.5 MG/5ML syrup Take 5 mLs by mouth every 8 (eight) hours as needed for cough. 180 mL 0   ipratropium-albuterol (DUONEB) 0.5-2.5 (3) MG/3ML SOLN Take 3 mLs by nebulization every 6 (six) hours as needed. 120 mL 5   PROLIA 60 MG/ML SOSY injection SMARTSIG:60 Milligram(s) SUB-Q Once     Current Facility-Administered Medications  Medication Dose Route Frequency Provider Last Rate Last Admin   0.9 %  sodium chloride infusion  500 mL Intravenous Continuous Ibeth Fahmy, Lajuan Lines, MD        Allergies as of 03/21/2022 - Review Complete 03/21/2022  Allergen Reaction Noted   Beclomethasone dipropionate Hives and Other (See Comments) 08/11/2009   Flexeril [cyclobenzaprine] Anxiety 01/10/2020   Mometasone furo-formoterol fum  Hives and Other (See Comments) 07/14/2009   Sulfonamide derivatives Hives and Rash    Statins  07/12/2013    Family History  Problem Relation Age of Onset   Breast cancer Mother    Hypertension Mother    Diabetes Mother    Cancer Mother        recurrent, metastatic breast cancer.    Diverticulosis Father    Prostate cancer Father    Breast cancer Sister    Cancer Sister        breast cancer, invasive ductal carcinoma in 2022,DCIS at 89 with 4 weeks of radiation, 5 years of Tamoxifen    Pulmonary embolism Brother        recurrent   Heart attack Maternal Grandfather    Cerebral palsy Son    Cancer Niece 29   Osteoporosis Niece    Stroke Neg Hx    Colon cancer Neg Hx    Esophageal cancer Neg Hx    Stomach cancer Neg Hx    Rectal cancer Neg Hx     Social History   Socioeconomic History   Marital status: Married    Spouse name: Not on file   Number of children: 1   Years of education: 14   Highest education level: Associate degree: academic program  Occupational History   Occupation: Disabled   Tobacco Use   Smoking status: Never    Passive exposure: Never   Smokeless tobacco: Never  Vaping Use   Vaping Use: Never used  Substance and Sexual Activity   Alcohol use: Yes    Comment: social use   Drug use: No   Sexual activity: Yes    Comment: gluten free, lives with husband and son with CP quadriplegia  Other Topics Concern   Not on file  Social History Narrative   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    Lives at home with her husband and son.   Right-handed.   2 cups caffeine per day.  One story home   Social Determinants of Health   Financial Resource Strain: Low Risk  (10/19/2021)   Overall Financial Resource Strain (CARDIA)    Difficulty of Paying Living Expenses: Not hard at all  Food Insecurity: No Food Insecurity (10/19/2021)   Hunger Vital Sign    Worried About Running Out of Food in the Last Year: Never true    Ran Out of Food in the Last Year: Never  true  Transportation Needs: No Transportation Needs (10/19/2021)   PRAPARE - Hydrologist (Medical): No    Lack of Transportation (Non-Medical): No  Physical Activity: Insufficiently Active (10/19/2021)   Exercise Vital Sign    Days of Exercise per Week: 7 days    Minutes of Exercise per Session: 20 min  Stress: Stress Concern Present (10/19/2021)   [place]    Feeling of Stress : Rather much  Social Connections: Moderately Integrated (10/19/2021)   Social Connection and Isolation Panel [NHANES]    Frequency of Communication with Friends and Family: More than three times a week    Frequency of Social Gatherings with Friends and Family: Once a week    Attends Religious Services: More than 4 times per year    Active Member of [employer] or Organizations: No    Attends Archivist Meetings: Never    Marital Status: Married  Human resources officer Violence: Not At Risk (10/19/2021)   Humiliation, Afraid, Rape, and Kick questionnaire    Fear of Current or Ex-Partner: No    Emotionally Abused: No    Physically Abused: No    Sexually Abused: No    Physical Exam: Vital signs in last 24 hours: @BP  117/61   Pulse 74   Temp 98.4 F (36.9 C) (Temporal)   Ht 5' 1"  (1.549 m)   Wt 149 lb (67.6 kg)   SpO2 100%   BMI 28.15 kg/m  GEN: NAD EYE: Sclerae anicteric ENT: MMM CV: Non-tachycardic Pulm: CTA b/l GI: Soft, NT/ND NEURO:  Alert & Oriented x 3   Zenovia Jarred, MD [facility]  03/21/2022 9:54 AM

## 2022-03-21 NOTE — Patient Instructions (Addendum)
Handout on diverticulosis given to you today Await pathology results   YOU HAD AN ENDOSCOPIC PROCEDURE TODAY AT [facility]:   Refer to the procedure report that was given to you for any specific questions about what was found during the examination.  If the procedure report does not answer your questions, please call your gastroenterologist to clarify.  If you requested that your care partner not be given the details of your procedure findings, then the procedure report has been included in a sealed envelope for you to review at your convenience later.  YOU SHOULD EXPECT: Some feelings of bloating in the abdomen. Passage of more gas than usual.  Walking can help get rid of the air that was put into your GI tract during the procedure and reduce the bloating. If you had a lower endoscopy (such as a colonoscopy or flexible sigmoidoscopy) you may notice spotting of blood in your stool or on the toilet paper. If you underwent a bowel prep for your procedure, you may not have a normal bowel movement for a few days.  Please Note:  You might notice some irritation and congestion in your nose or some drainage.  This is from the oxygen used during your procedure.  There is no need for concern and it should clear up in a day or so.  SYMPTOMS TO REPORT IMMEDIATELY:  Following lower endoscopy (colonoscopy or flexible sigmoidoscopy):  Excessive amounts of blood in the stool  Significant tenderness or worsening of abdominal pains  Swelling of the abdomen that is new, acute  Fever of 100F or higher  For urgent or emergent issues, a gastroenterologist can be reached at any hour by calling [phone]. Do not use MyChart messaging for urgent concerns.    DIET:  We do recommend a small meal at first, but then you may proceed to your regular diet.  Drink plenty of fluids but you should avoid alcoholic beverages for 24 hours.  ACTIVITY:  You should plan to take it easy for the rest of today  and you should NOT DRIVE or use heavy machinery until tomorrow (because of the sedation medicines used during the test).    FOLLOW UP: Our staff will call the number listed on your records the next business day following your procedure.  We will call around 7:15- 8:00 am to check on you and address any questions or concerns that you may have regarding the information given to you following your procedure. If we do not reach you, we will leave a message.     If any biopsies were taken you will be contacted by phone or by letter within the next 1-3 weeks.  Please call us at [phone] if you have not heard about the biopsies in 3 weeks.    SIGNATURES/CONFIDENTIALITY: You and/or your care partner have signed paperwork which will be entered into your electronic medical record.  These signatures attest to the fact that that the information above on your After Visit Summary has been reviewed and is understood.  Full responsibility of the confidentiality of this discharge information lies with you and/or your care-partner.

## 2022-03-21 NOTE — Progress Notes (Signed)
To pacu, VSS. Report to [email or web address] 

## 2022-03-21 NOTE — Op Note (Signed)
[place] Patient Name: Victoria [name] Procedure Date: 03/21/2022 9:58 AM MRN: [id number] Endoscopist: Jerene Bears , MD Age: 66 Referring MD:  Date of Birth: 26-Nov-1955 Gender: Female Account #: [id number] Procedure:                Colonoscopy Indications:              Chronic diarrhea (presumed secondary to                            inflammation, + fecal WBCs), + ANA, elevated CK,                            last colon June 2021 (left colon biopsies neg for                            inflammation and SCAD) Medicines:                Monitored Anesthesia Care Procedure:                Pre-Anesthesia Assessment:                           - Prior to the procedure, a History and Physical                            was performed, and patient medications and                            allergies were reviewed. The patient's tolerance of                            previous anesthesia was also reviewed. The risks                            and benefits of the procedure and the sedation                            options and risks were discussed with the patient.                            All questions were answered, and informed consent                            was obtained. Prior Anticoagulants: The patient has                            taken no previous anticoagulant or antiplatelet                            agents. ASA Grade Assessment: II - A patient with                            mild systemic disease. After reviewing the risks  and benefits, the patient was deemed in                            satisfactory condition to undergo the procedure.                           After obtaining informed consent, the colonoscope                            was passed under direct vision. Throughout the                            procedure, the patient's blood pressure, pulse, and                            oxygen saturations were monitored continuously. The                             Olympus PCF-[id number] (#[id number]) Colonoscope was                            introduced through the anus and advanced to the                            terminal ileum. The colonoscopy was performed                            without difficulty. The patient tolerated the                            procedure well. The quality of the bowel                            preparation was good. The ileocecal valve,                            appendiceal orifice, and rectum were photographed. Scope In: 10:12:15 AM Scope Out: 10:28:23 AM Scope Withdrawal Time: 0 hours 13 minutes 3 seconds  Total Procedure Duration: 0 hours 16 minutes 8 seconds  Findings:                 The digital rectal exam was normal.                           The distal ileum contained a few non-bleeding                            erosions. Biopsies were taken with a cold forceps                            for histology to exclude Crohn's ileitis.                           Multiple small and large-mouthed diverticula were  found in the sigmoid colon, descending colon,                            transverse colon and ascending colon. Erythema was                            seen in association with the diverticular opening                            in sigmoid colon.                           Biopsies for histology were taken with a cold                            forceps from the right colon and left colon for                            evaluation of microscopic colitis.                           The retroflexed view of the distal rectum and anal                            verge was normal and showed no anal or rectal                            abnormalities. Complications:            No immediate complications. Estimated Blood Loss:     Estimated blood loss was minimal. Impression:               - A few erosions in the distal ileum. Biopsied to                            exclude  Crohn's disease.                           - Mild diverticulosis in the sigmoid colon, in the                            descending colon, in the transverse colon and in                            the ascending colon. Erythema was seen in                            association with the diverticular opening in the                            sigmoid.                           - Biopsies were taken with a cold forceps from the  right colon and left colon for evaluation of                            microscopic colitis. Recommendation:           - Patient has a contact number available for                            emergencies. The signs and symptoms of potential                            delayed complications were discussed with the                            patient. Return to normal activities tomorrow.                            Written discharge instructions were provided to the                            patient.                           - Resume previous diet.                           - Continue present medications.                           - Await pathology results.                           - Repeat colonoscopy is recommended. The                            colonoscopy date will be determined after pathology                            results from today's exam become available for                            review. Jerene Bears, MD 03/21/2022 10:43:07 AM This report has been signed electronically.

## 2022-03-22 ENCOUNTER — Telehealth: Payer: Self-pay | Admitting: *Deleted

## 2022-03-22 NOTE — Telephone Encounter (Signed)
Patient requests no follow up call

## 2022-03-22 NOTE — Telephone Encounter (Signed)
MR, please advise on the Asmanex inhaler you wanted pt to restart. In the past she was on the 273mg strength but looking back at the previous scripts the instructions stated to take once a day and also twice a day. Please advise if you would like pt to take 2 puffs QD or BID. Thanks.

## 2022-03-24 ENCOUNTER — Encounter: Payer: Self-pay | Admitting: Family Medicine

## 2022-03-25 ENCOUNTER — Telehealth: Payer: Self-pay

## 2022-03-25 ENCOUNTER — Other Ambulatory Visit: Payer: Self-pay

## 2022-03-25 MED ORDER — BUDESONIDE 3 MG PO CPEP: ORAL_CAPSULE | ORAL | 0 refills | Status: DC

## 2022-03-25 NOTE — Telephone Encounter (Signed)
Ok to send

## 2022-03-25 NOTE — Telephone Encounter (Signed)
  Dr. Hilarie Fredrickson I tried entering the entocort prescription and these alerts come up. Please advise.

## 2022-03-25 NOTE — Telephone Encounter (Signed)
Script sent to pharmacy, see result note.

## 2022-03-31 NOTE — Progress Notes (Deleted)
Office Visit Note  Patient: Victoria [name]             Date of Birth: 12-30-1955           MRN: [id number]             PCP: Mosie Lukes, MD Referring: Mosie Lukes, MD Visit Date: 04/14/2022 Occupation: @GUAROCC @  Subjective:  No chief complaint on file.   History of Present Illness: Victoria [name] is a 66 y.o. female ***   Activities of Daily Living:  Patient reports morning stiffness for *** {minute/hour:19697}.   Patient {ACTIONS;DENIES/REPORTS:21021675::"Denies"} nocturnal pain.  Difficulty dressing/grooming: {ACTIONS;DENIES/REPORTS:21021675::"Denies"} Difficulty climbing stairs: {ACTIONS;DENIES/REPORTS:21021675::"Denies"} Difficulty getting out of chair: {ACTIONS;DENIES/REPORTS:21021675::"Denies"} Difficulty using hands for taps, buttons, cutlery, and/or writing: {ACTIONS;DENIES/REPORTS:21021675::"Denies"}  No Rheumatology ROS completed.   PMFS History:  Patient Active Problem List   Diagnosis Date Noted  . Scleritis of right eye 02/18/2022  . Diarrhea 11/16/2021  . Lumbar radiculopathy 09/20/2021  . Sacroiliac joint dysfunction of right side 08/16/2021  . Greater trochanteric pain syndrome of left lower extremity 07/29/2021  . Diverticulitis 07/07/2021  . Iron deficiency anemia 09/21/2020  . Family history of breast cancer 09/21/2020  . Preventative health care 07/01/2020  . Hemorrhoids 07/01/2020  . LUQ discomfort 07/01/2020  . Statin intolerance 01/10/2020  . Occipital pain 06/27/2019  . Tinnitus of both ears 06/27/2019  . Statin myopathy 06/19/2019  . Other cervical disc degeneration, unspecified cervical region 06/03/2019  . Headache 03/19/2019  . Tinnitus 03/19/2019  . Heart disease 02/21/2019  . Neck pain 02/17/2019  . Toe pain, right 10/09/2018  . Hx of cold sores 10/09/2018  . Bronchitis with asthma, acute 07/18/2018  . Osteoporosis 05/23/2018  . S/P angioplasty with stent 08/23/17 ostial 2nd OM with DES synnergy 08/24/2017  . CAD (coronary  artery disease)   . Anxiety and depression 01/16/2017  . Insomnia 01/16/2017  . Acute recurrent sinusitis 05/27/2016  . Muscle spasm 05/03/2016  . Chronic pain 05/03/2016  . Severe persistent asthma with intensive monitoring 03/22/2016  . BMI 30.0-30.9,adult 02/18/2016  . MRSA (methicillin resistant Staphylococcus aureus) colonization 01/12/2016  . Sleep disorder due to stress 01/12/2016  . Hyperglycemia 11/20/2015  . Chronic diastolic CHF (congestive heart failure), NYHA class 2 ([place]) 06/26/2015  . Coronary artery disease due to lipid rich plaque 06/26/2015  . 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[place]) 00/86/7619  . Cardiomegaly 11/21/2014  . Seasonal and perennial allergic rhinitis 10/22/2011  . Fatigue 09/01/2011  . Chest pain, atypical 06/01/2011  . OSA (obstructive sleep apnea) 11/13/2010  . UMBILICAL HERNIA 50/93/2671  . DIVERTICULOSIS, COLON 06/11/2010  . HEPATIC CYST 06/11/2010  . Idiopathic scoliosis and kyphoscoliosis 06/11/2010  . TRANSAMINASES, SERUM, ELEVATED 05/27/2010  . Abdominal pain 01/15/2010  . ELEVATED BP READING WITHOUT DX HYPERTENSION 08/03/2009  . Gastroesophageal reflux disease with hiatal hernia 10/21/2008  . h/o PALPITATIONS 08/19/2008  . A B P A-ALLERGIC BRONCHOPULMONARY ASPERGILLOSIS 12/14/2007  . Asthma 11/16/2007  . Hyperlipidemia, mixed 09/11/2007  . STRESS INCONTINENCE 09/11/2007  . Cushing's syndrome ([place]) 12/11/2006  . OA (osteoarthritis) of knee 12/11/2006    Past Medical History:  Diagnosis Date  . 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 Anemia    iron deficiency, resolved  . Anxiety   . Asthma   . CAD (coronary artery disease)    a. LHC 6/16:  oOM1 60, pRCA 25 >> med Rx b. cath 3/19 2nd OM with 95% stenosis s/p synergy DES & anomalous  RCA  . CAP (community acquired pneumonia) 2016; 06/07/2016  . Chronic bronchitis ([place])   . Chronic lower back pain   . Complication of anesthesia    "think I  have a hard time waking up from it"  . COPD (chronic obstructive pulmonary disease) ([place])   . Depression    mild  . Diverticulitis   . Diverticulosis   . GERD (gastroesophageal reflux disease)   . H/O hiatal hernia   . Headache    "weekly" (08/23/2017)  . History of echocardiogram    Echo 6/16:  Mod LVH, EF 60-65%, no RWMA, Gr 1 DD, trivial MR, normal LA size.  Marland Kitchen Hyperglycemia 11/20/2015  . Hyperlipidemia, mixed 09/11/2007   Qualifier: Diagnosis of  By: Jerold Coombe   Did not tolerate Lipitor, zocor, Lovastatin, Pravastatin, Livalo, Crestor even low dose   . IBS (irritable bowel syndrome)   . Maxillary sinusitis   . Normal cardiac stress test 11/2011   No evidence of ischemia or infarct.   Calculated ejection fraction 72%.  . Obesity   . OSA (obstructive sleep apnea) 02/2012   has stopped using  cpap  . Osteoarthritis   . Osteoporosis   . Pneumonia 11/2011   "before 2013 I hadn't had pneumonia since I was a child" (04/13/2012)  . Pulmonary nodules   . S/P angioplasty with stent 08/23/17 ostial 2nd OM with DES synnergy 08/24/2017  . Schatzki's ring     Family History  Problem Relation Age of Onset  . Breast cancer Mother   . Hypertension Mother   . Diabetes Mother   . Cancer Mother        recurrent, metastatic breast cancer.   . Diverticulosis Father   . Prostate cancer Father   . Breast cancer Sister   . Cancer Sister        breast cancer, invasive ductal carcinoma in 2022,DCIS at 40 with 4 weeks of radiation, 5 years of Tamoxifen   . Pulmonary embolism Brother        recurrent  . Heart attack Maternal Grandfather   . Cerebral palsy Son   . Cancer Niece 25  . Osteoporosis Niece   . Stroke Neg Hx   . Colon cancer Neg Hx   . Esophageal cancer Neg Hx   . Stomach cancer Neg Hx   . Rectal cancer Neg Hx    Past Surgical History:  Procedure Laterality Date  . APPENDECTOMY  1989  . CARDIAC CATHETERIZATION N/A 11/25/2014   Procedure: Right/Left Heart Cath and Coronary  Angiography;  Surgeon: Belva Crome, MD;  Location: [facility];  Service: Cardiovascular;  Laterality: N/A;  . CESAREAN SECTION  1985  . CORONARY ANGIOPLASTY WITH STENT PLACEMENT  08/23/2017  . CORONARY STENT INTERVENTION N/A 08/23/2017   Procedure: CORONARY STENT INTERVENTION;  Surgeon: Burnell Blanks, MD;  Location: [facility];  Service: Cardiovascular;  Laterality: N/A;  . HERNIA REPAIR  04/13/2012   VHR laparoscopic  . LEFT HEART CATH AND CORONARY ANGIOGRAPHY N/A 08/23/2017   Procedure: LEFT HEART CATH AND CORONARY ANGIOGRAPHY;  Surgeon: Burnell Blanks, MD;  Location: [facility];  Service: Cardiovascular;  Laterality: N/A;  . VENTRAL HERNIA REPAIR  04/13/2012   Procedure: LAPAROSCOPIC VENTRAL HERNIA;  Surgeon: Adin Hector, MD;  Location: [facility];  Service: General;  Laterality: N/A;  laparoscopic repair of incarcerated hernia   Social History   Social History Narrative   Cares for son with cerebral palsy.  Lives at home with her husband and son.   Right-handed.   2 cups caffeine per day.   One story home   Immunization History  Administered Date(s) Administered  . Influenza Split 04/20/2011, 04/20/2020  . Influenza Whole 06/06/2007, 04/15/2008, 04/02/2009, 03/29/2012  . Influenza, High Dose Seasonal PF 05/05/2015  . Influenza,inj,Quad PF,6+ Mos 05/09/2013, 03/03/2014, 05/03/2016, 04/21/2017, 04/02/2018, 03/19/2019, 04/20/2020, 05/03/2021, 03/07/2022  . Moderna Sars-Covid-2 Vaccination 08/15/2019, 09/17/2019, 05/16/2020, 12/04/2020  . Pension scheme manager 61yr & up 04/16/2021  . Pneumococcal Conjugate-13 05/09/2013  . Pneumococcal Polysaccharide-23 05/04/2005  . Td 07/29/2009  . Tdap 03/11/2015  . Zoster Recombinat (Shingrix) 05/09/2019, 08/01/2019     Objective: Vital Signs: There were no vitals taken for this visit.   Physical Exam   Musculoskeletal Exam: ***  CDAI Exam: CDAI Score: -- Patient Global:  --; Provider Global: -- Swollen: --; Tender: -- Joint Exam 04/14/2022   No joint exam has been documented for this visit   There is currently no information documented on the homunculus. Go to the Rheumatology activity and complete the homunculus joint exam.  Investigation: No additional findings.  Imaging: No results found.  Recent Labs: Lab Results  Component Value Date   WBC 8.5 09/23/2021   HGB 12.9 09/23/2021   PLT 303 09/23/2021   NA 135 09/23/2021   K 4.2 09/23/2021   CL 99 09/23/2021   CO2 21 09/23/2021   GLUCOSE 90 09/23/2021   BUN 21 09/23/2021   CREATININE 0.57 09/23/2021   BILITOT <0.2 09/23/2021   ALKPHOS 63 09/23/2021   AST 20 09/23/2021   ALT 24 09/23/2021   PROT 6.3 09/23/2021   ALBUMIN 4.5 09/23/2021   CALCIUM 9.2 09/23/2021   GFRAA >60 08/24/2017    Speciality Comments: No specialty comments available.  Procedures:  No procedures performed Allergies: Beclomethasone dipropionate, Flexeril [cyclobenzaprine], Mometasone furo-formoterol fum, Sulfonamide derivatives, and Statins   Assessment / Plan:     Visit Diagnoses: No diagnosis found.  Orders: No orders of the defined types were placed in this encounter.  No orders of the defined types were placed in this encounter.   Face-to-face time spent with patient was *** minutes. Greater than 50% of time was spent in counseling and coordination of care.  Follow-Up Instructions: No follow-ups on file.   MEarnestine Mealing CMA  Note - This record has been created using DEditor, commissioning  Chart creation errors have been sought, but may not always  have been located. Such creation errors do not reflect on  the standard of medical care.

## 2022-04-03 NOTE — Progress Notes (Unsigned)
Cardiology Office Note:    Date:  04/03/2022   ID:  Victoria [name], DOB 19-Mar-1956, MRN [id number]  PCP:  Mosie Lukes, MD   [facility] Providers Cardiologist:  Ena Dawley, MD {   Referring MD: Mosie Lukes, MD    History of Present Illness:    Victoria [name] is a 66 y.o. female with a hx of CAD s/p DES to OM on 08/2017, HLD on PCSK9i, anxiety and OSA on CPAP who was previously followed by Dr. Meda Coffee who now returns to clinic for follow-up.  Per review of the record, the patient has a long history of chest pain. Myoview 02/2019 low risk. Chest CT 04/02/20 showed chronic scarring in right lower lobe nodule and large hiatal hernia which was felt to be causing some of her symptoms.  Last seen in clinic on 10/2020 where she was doing well from a CV standpoint.  Today, she is doing well. She has lost weight and currently is at 151 lbs. She has been following the Keto diet. She had one episode of high blood pressure which she associates to not taking Ativan. She denies chest pain, chest pressure, dyspnea at rest or with exertion, palpitations, PND, orthopnea, or leg swelling.  She endorses back pain due to scoliosis and osteoporosis. The pain radiates down her L hip and L pain which prevents her from exercising regularly. She is able to walk 1/2 a mile before stopping.   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 LHC 6/16:  oOM1 60, pRCA 25 >> med Rx b. cath 3/19 2nd OM with 95% stenosis s/p synergy DES & anomalous RCA   CAP (community acquired pneumonia) 2016; 06/07/2016   Chronic bronchitis (HCC)    Chronic lower back pain    Complication of anesthesia    "think I have a hard time waking up from it"   COPD (chronic obstructive pulmonary disease) ([place])    Depression    mild    Diverticulitis    Diverticulosis    GERD (gastroesophageal reflux disease)    H/O hiatal hernia    Headache    "weekly" (08/23/2017)   History of echocardiogram    Echo 6/16:  Mod LVH, EF 60-65%, no RWMA, Gr 1 DD, trivial MR, normal LA size.   Hyperglycemia 11/20/2015   Hyperlipidemia, mixed 09/11/2007   Qualifier: Diagnosis of  By: Jerold Coombe   Did not tolerate Lipitor, zocor, Lovastatin, Pravastatin, Livalo, Crestor even low dose    IBS (irritable bowel syndrome)    Maxillary sinusitis    Normal cardiac stress test 11/2011   No evidence of ischemia or infarct.   Calculated ejection fraction 72%.   Obesity    OSA (obstructive sleep apnea) 02/2012   has stopped using  cpap   Osteoarthritis    Osteoporosis    Pneumonia 11/2011   "before 2013 I hadn't had pneumonia since I was a child" (04/13/2012)   Pulmonary nodules    S/P angioplasty with stent 08/23/17 ostial 2nd OM with DES synnergy 08/24/2017   Schatzki's ring     Past Surgical History:  Procedure Laterality Date   [place] N/A 11/25/2014   Procedure: Right/Left Heart Cath and Coronary Angiography;  Surgeon: Mallie Mussel  Nicholes Stairs, MD;  Location: [place] [facility];  Service: Cardiovascular;  Laterality: N/A;   [place] WITH STENT PLACEMENT  08/23/2017   CORONARY STENT INTERVENTION N/A 08/23/2017   Procedure: CORONARY STENT INTERVENTION;  Surgeon: Burnell Blanks, MD;  Location: [place] [facility];  Service: Cardiovascular;  Laterality: N/A;   HERNIA REPAIR  04/13/2012   VHR laparoscopic   LEFT HEART CATH AND CORONARY ANGIOGRAPHY N/A 08/23/2017   Procedure: LEFT HEART CATH AND CORONARY ANGIOGRAPHY;  Surgeon: Burnell Blanks, MD;  Location: [place] [facility];  Service: Cardiovascular;  Laterality: N/A;   VENTRAL HERNIA REPAIR  04/13/2012   Procedure: LAPAROSCOPIC VENTRAL HERNIA;  Surgeon: Adin Hector, MD;  Location: [facility];  Service: General;   Laterality: N/A;  laparoscopic repair of incarcerated hernia    Current Medications: No outpatient medications have been marked as taking for the 04/07/22 encounter (Appointment) with Freada Bergeron, MD.     Allergies:   Beclomethasone dipropionate, Flexeril [cyclobenzaprine], Mometasone furo-formoterol fum, Sulfonamide derivatives, and Statins   Social History   Socioeconomic History   Marital status: Married    Spouse name: Not on file   Number of children: 1   Years of education: 14   Highest education level: Associate degree: academic program  Occupational History   Occupation: Disabled   Tobacco Use   Smoking status: Never    Passive exposure: Never   Smokeless tobacco: Never  Vaping Use   Vaping Use: Never used  Substance and Sexual Activity   Alcohol use: Yes    Comment: social use   Drug use: No   Sexual activity: Yes    Comment: gluten free, lives with husband and son with CP quadriplegia  Other Topics Concern   Not on file  Social History Narrative   Cares for son with cerebral palsy.    Lives at home with her husband and son.   Right-handed.   2 cups caffeine per day.   One story home   Social Determinants of Health   Financial Resource Strain: Low Risk  (10/19/2021)   Overall Financial Resource Strain (CARDIA)    Difficulty of Paying Living Expenses: Not hard at all  Food Insecurity: No Food Insecurity (10/19/2021)   Hunger Vital Sign    Worried About Running Out of Food in the Last Year: Never true    Ran Out of Food in the Last Year: Never true  Transportation Needs: No Transportation Needs (10/19/2021)   PRAPARE - Hydrologist (Medical): No    Lack of Transportation (Non-Medical): No  Physical Activity: Insufficiently Active (10/19/2021)   Exercise Vital Sign    Days of Exercise per Week: 7 days    Minutes of Exercise per Session: 20 min  Stress: Stress Concern Present (10/19/2021)   [place]    Feeling of Stress : Rather much  Social Connections: Moderately Integrated (10/19/2021)   Social Connection and Isolation Panel [NHANES]    Frequency of Communication with Friends and Family: More than three times a week    Frequency of Social Gatherings with Friends and Family: Once a week    Attends Religious Services: More than 4 times per year    Active Member of [employer] or Organizations: No    Attends Archivist Meetings: Never    Marital Status: Married     Family History: The patient's family  history includes Breast cancer in her mother and sister; Cancer in her mother and sister; Cancer (age of onset: 32) in her niece; Cerebral palsy in her son; Diabetes in her mother; Diverticulosis in her father; Heart attack in her maternal grandfather; Hypertension in her mother; Osteoporosis in her niece; Prostate cancer in her father; Pulmonary embolism in her brother. There is no history of Stroke, Colon cancer, Esophageal cancer, Stomach cancer, or Rectal cancer.  ROS:   Please see the history of present illness.    Review of Systems  Constitutional:  Negative for chills and fever.  HENT:  Negative for sore throat.   Eyes:  Negative for redness.  Respiratory:  Negative for cough.   Cardiovascular:  Negative for chest pain, palpitations, orthopnea, claudication, leg swelling and PND.  Gastrointestinal:  Negative for blood in stool, heartburn and melena.  Genitourinary:  Negative for hematuria.  Musculoskeletal:  Positive for back pain and joint pain (L hip and L knee).  Neurological:  Negative for dizziness and loss of consciousness.  Endo/Heme/Allergies:  Does not bruise/bleed easily.  Psychiatric/Behavioral:  Negative for substance abuse.     EKGs/Labs/Other Studies Reviewed:    The following studies were reviewed today: CTA Chest 04/16/21 IMPRESSION: 1. No aneurysm or acute findings. 2. Coronary calcifications. The severity of  coronary artery disease and any potential stenosis cannot be assessed on this non-gated CT examination. Assessment for potential risk factor modification, dietary therapy or pharmacologic therapy may be warranted, if clinically indicated. 3. Stable moderate hiatal hernia and left diaphragmatic eventration.   Echocardiogram 03/20/2019 IMPRESSIONS  1. Left ventricular ejection fraction, by visual estimation, is 60 to 65%. The left ventricle has normal function. Normal left ventricular size. Mildly increased left ventricular posterior wall thickness. There is mildly increased left ventricular  hypertrophy.  2. Elevated left ventricular end-diastolic pressure.  3. Left ventricular diastolic Doppler parameters are consistent with impaired relaxation pattern of LV diastolic filling.  4. Global right ventricle has normal systolic function.The right ventricular size is normal. No increase in right ventricular wall thickness.  5. Left atrial size was normal.  6. Right atrial size was normal.  7. The mitral valve is normal in structure. No evidence of mitral valve regurgitation. No evidence of mitral stenosis.  8. The tricuspid valve is normal in structure. Tricuspid valve regurgitation is trivial.  9. The aortic valve is normal in structure. Aortic valve regurgitation is mild by color flow Doppler. Structurally normal aortic valve, with no evidence of sclerosis or stenosis. 10. The pulmonic valve was normal in structure. Pulmonic valve regurgitation is not visualized by color flow Doppler. 11. Aortic dilatation noted. 12. There is mild dilatation of the ascending aorta measuring 36 mm. 13. Normal pulmonary artery systolic pressure. 14. The inferior vena cava is normal in size with greater than 50% respiratory variability, suggesting right atrial pressure of 3 mmHg.   Exercise Myoview 03/15/2019 Study Highlights   Nuclear stress EF: 74%. There was no ST segment deviation noted during stress. No T  wave inversion was noted during stress. Blood pressure demonstrated a hypertensive response to exercise. The study is normal. This is a low risk study.   Low risk stress nuclear study with normal perfusion and normal left ventricular regional and global systolic function.      Carotid ultrasound 03/12/2019 Summary: Right Carotid: Velocities in the right ICA are consistent with a 1-39% stenosis.   Left Carotid: Velocities in the left ICA are consistent with a 1-39% stenosis.  Non-hemodynamically significant plaque <50% noted in the CCA.   Vertebrals:  Bilateral vertebral arteries demonstrate antegrade flow. Subclavians: Normal flow hemodynamics were seen in bilateral subclavian              arteries.    Left heart cath 08/23/2017  Conclusion   Ost 2nd Mrg lesion is 95% stenosed. A drug-eluting stent was successfully placed using a STENT SYNERGY DES 3X16. Post intervention, there is a 0% residual stenosis. The left ventricular systolic function is normal. LV end diastolic pressure is normal. The left ventricular ejection fraction is 55-65% by visual estimate. There is no mitral valve regurgitation.   1. Severe single vessel CAD with severe stenosis in the ostium of the large obtuse marginal branch.  2. Successful PTCA/DES x 1 ostium of the OM. 3. 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  4. The LAD has no obstructive disease.  5. Normal LV systolic function   Recommendations: Will continue DAPT with ASA and Plavix for at least one year. Consider addition of a beta blocker or nitrate.        EKG:  EKG not obtained today  Recent Labs: 07/06/2021: Magnesium 2.1; TSH 1.82 09/23/2021: ALT 24; BUN 21; Creatinine, Ser 0.57; Hemoglobin 12.9; Platelets 303; Potassium 4.2; Sodium 135  Recent Lipid Panel    Component Value Date/Time   CHOL 172 07/06/2021 1435   CHOL 227 (H) 07/25/2016 0822   TRIG 110.0 07/06/2021  1435   HDL 55.30 07/06/2021 1435   HDL 51 07/25/2016 0822   CHOLHDL 3 07/06/2021 1435   VLDL 22.0 07/06/2021 1435   LDLCALC 95 07/06/2021 1435   LDLCALC 160 (H) 07/25/2016 0822   LDLDIRECT 177.8 09/07/2011 0945     Physical Exam:    VS:  There were no vitals taken for this visit.    Wt Readings from Last 3 Encounters:  03/21/22 149 lb (67.6 kg)  03/07/22 154 lb 6.4 oz (70 kg)  03/02/22 154 lb 9.6 oz (70.1 kg)     GEN:  Well nourished, well developed in no acute distress HEENT: Normal NECK: No JVD; No carotid bruits CARDIAC: RRR, no murmurs, rubs, gallops RESPIRATORY:  Clear to auscultation without rales, wheezing or rhonchi  ABDOMEN: Soft, non-tender, non-distended MUSCULOSKELETAL:  No edema; No deformity  SKIN: Warm and dry NEUROLOGIC:  Alert and oriented x 3 PSYCHIATRIC:  Normal affect   ASSESSMENT:    No diagnosis found.   PLAN:    In order of problems listed above:  #CAD s/p DES to OM on 08/2017: Stable with episodes of atypical chest pain. Myoview 02/2019 showed normal LVEF with normal perfusion. -Continue ASA 83m daily -Continue repatha 1416mevery 3 weeks (does not tolerate q 2 weeks due to muscle aches) -No BB due to severe asthma  #GIB: #Iron deficiency anemia: No current bleeding. S/p iron infusions x2. Follows with Heme. -Continue famotidine -Follow-up with heme as scheduled -If needed, can use PPI  #Severe Asthma: -Followed Ramaswamy -No BB due to severity of asthma  #HLD: #Statin Interolerance: LDL 85 in 06/2020. Overall near goal of 70 and did not tolerate statins or q2 week repatha. Will continue current regimen. -Continue repatha 14076m 3 weeks (has adjusted dosing to minimize side-effects)  #OSA: -Compliant with home CPAP  #Mild Ascending Aortic Aneurysm: Measured 42m32m TTE in 2020. Normal on CTA. No further monitoring needed.     Medication Adjustments/Labs and Tests Ordered: Current medicines are  reviewed at length with the  patient today.  Concerns regarding medicines are outlined above.  No orders of the defined types were placed in this encounter.  No orders of the defined types were placed in this encounter.   There are no Patient Instructions on file for this visit.   I,Mykaella Javier,acting as a scribe for Freada Bergeron, MD.,have documented all relevant documentation on the behalf of Freada Bergeron, MD,as directed by  Freada Bergeron, MD while in the presence of Freada Bergeron, MD.  I, Freada Bergeron, MD, have reviewed all documentation for this visit. The documentation on 04/03/22 for the exam, diagnosis, procedures, and orders are all accurate and complete.   Signed, Freada Bergeron, MD  04/03/2022 2:46 PM    [place]

## 2022-04-06 DIAGNOSIS — H00024 Hordeolum internum left upper eyelid: Secondary | ICD-10-CM | POA: Diagnosis not present

## 2022-04-07 ENCOUNTER — Ambulatory Visit (INDEPENDENT_AMBULATORY_CARE_PROVIDER_SITE_OTHER): Payer: PPO

## 2022-04-07 ENCOUNTER — Encounter: Payer: Self-pay | Admitting: Cardiology

## 2022-04-07 ENCOUNTER — Ambulatory Visit: Payer: PPO | Attending: Cardiology | Admitting: Cardiology

## 2022-04-07 VITALS — BP 112/70 | HR 70 | Ht 61.0 in | Wt 153.0 lb

## 2022-04-07 DIAGNOSIS — R072 Precordial pain: Secondary | ICD-10-CM

## 2022-04-07 DIAGNOSIS — I2583 Coronary atherosclerosis due to lipid rich plaque: Secondary | ICD-10-CM

## 2022-04-07 DIAGNOSIS — E785 Hyperlipidemia, unspecified: Secondary | ICD-10-CM | POA: Diagnosis not present

## 2022-04-07 DIAGNOSIS — G720 Drug-induced myopathy: Secondary | ICD-10-CM

## 2022-04-07 DIAGNOSIS — D509 Iron deficiency anemia, unspecified: Secondary | ICD-10-CM

## 2022-04-07 DIAGNOSIS — T466X5D Adverse effect of antihyperlipidemic and antiarteriosclerotic drugs, subsequent encounter: Secondary | ICD-10-CM

## 2022-04-07 DIAGNOSIS — G4733 Obstructive sleep apnea (adult) (pediatric): Secondary | ICD-10-CM | POA: Diagnosis not present

## 2022-04-07 DIAGNOSIS — R002 Palpitations: Secondary | ICD-10-CM

## 2022-04-07 DIAGNOSIS — I2510 Atherosclerotic heart disease of native coronary artery without angina pectoris: Secondary | ICD-10-CM | POA: Diagnosis not present

## 2022-04-07 DIAGNOSIS — J4550 Severe persistent asthma, uncomplicated: Secondary | ICD-10-CM

## 2022-04-07 NOTE — Progress Notes (Unsigned)
Enrolled for [employer] to mail a ZIO XT long term holter monitor on 04/14/2022, to the patients address on file.

## 2022-04-07 NOTE — Patient Instructions (Signed)
Medication Instructions:   Your physician recommends that you continue on your current medications as directed. Please refer to the Current Medication list given to you today.  *If you need a refill on your cardiac medications before your next appointment, please call your pharmacy*    Testing/Procedures:  [place] Monitor Instructions  Your physician has requested you wear a ZIO patch monitor for 7  days.  This is a single patch monitor. [employer] supplies one patch monitor per enrollment. Additional stickers are not available. Please do not apply patch if you will be having a Nuclear Stress Test,  Echocardiogram, Cardiac CT, MRI, or Chest Xray during the period you would be wearing the  monitor. The patch cannot be worn during these tests. You cannot remove and re-apply the  ZIO XT patch monitor.  Your ZIO patch monitor will be mailed 3 day USPS to your address on file. It may take 3-5 days  to receive your monitor after you have been enrolled.  Once you have received your monitor, please review the enclosed instructions. Your monitor  has already been registered assigning a specific monitor serial # to you.  Billing and Patient Assistance Program Information  We have supplied [employer] with any of your insurance information on file for billing purposes. [employer] offers a sliding scale Patient Assistance Program for patients that do not have  insurance, or whose insurance does not completely cover the cost of the ZIO monitor.  You must apply for the Patient Assistance Program to qualify for this discounted rate.  To apply, please call [employer] at [phone], select option 4, select option 2, ask to apply for  Patient Assistance Program. Theodore Demark will ask your household income, and how many people  are in your household. They will quote your out-of-pocket cost based on that information.  [employer] will also be able to set up a 78-month interest-free payment plan if  needed.  Applying the monitor   Shave hair from upper left chest.  Hold abrader disc by orange tab. Rub abrader in 40 strokes over the upper left chest as  indicated in your monitor instructions.  Clean area with 4 enclosed alcohol pads. Let dry.  Apply patch as indicated in monitor instructions. Patch will be placed under collarbone on left  side of chest with arrow pointing upward.  Rub patch adhesive wings for 2 minutes. Remove white label marked "1". Remove the white  label marked "2". Rub patch adhesive wings for 2 additional minutes.  While looking in a mirror, press and release button in center of patch. A small green light will  flash 3-4 times. This will be your only indicator that the monitor has been turned on.  Do not shower for the first 24 hours. You may shower after the first 24 hours.  Press the button if you feel a symptom. You will hear a small click. Record Date, Time and  Symptom in the Patient Logbook.  When you are ready to remove the patch, follow instructions on the last 2 pages of Patient  Logbook. Stick patch monitor onto the last page of Patient Logbook.  Place Patient Logbook in the blue and white box. Use locking tab on box and tape box closed  securely. The blue and white box has prepaid postage on it. Please place it in the mailbox as  soon as possible. Your physician should have your test results approximately 7 days after the  monitor has been [facility]  Call  [place] at [phone] if you have questions regarding  your ZIO XT patch monitor. Call them immediately if you see an orange light blinking on your  monitor.  If your monitor falls off in less than 4 days, contact our Monitor department at [phone].  If your monitor becomes loose or falls off after 4 days call [employer] at [phone] for  suggestions on securing your monitor    Follow-Up: At [facility], you and your health needs are  our priority.  As part of our continuing mission to provide you with exceptional heart care, we have created designated Provider Care Teams.  These Care Teams include your primary Cardiologist (physician) and Advanced Practice Providers (APPs -  Physician Assistants and Nurse Practitioners) who all work together to provide you with the care you need, when you need it.  We recommend signing up for the patient portal called "MyChart".  Sign up information is provided on this After Visit Summary.  MyChart is used to connect with patients for Virtual Visits (Telemedicine).  Patients are able to view lab/test results, encounter notes, upcoming appointments, etc.  Non-urgent messages can be sent to your provider as well.   To learn more about what you can do with MyChart, go to [email or web address].    Your next appointment:   6 month(s)  The format for your next appointment:   In Person  Provider:   Robbie Lis, PA-C, Nicholes Rough, PA-C, Melina Copa, PA-C, Ambrose Pancoast, NP, Cecilie Kicks, NP, Ermalinda Barrios, PA-C, Christen Bame, NP, or Richardson Dopp, PA-C          Important Information About Sugar

## 2022-04-07 NOTE — Progress Notes (Signed)
Cardiology Office Note:    Date:  04/07/2022   ID:  Victoria [name], DOB 1955-10-12, MRN [id number]  PCP:  Mosie Lukes, MD   [facility] Providers Cardiologist:  Ena Dawley, MD {   Referring MD: Mosie Lukes, MD    History of Present Illness:    Victoria [name] is a 66 y.o. female with a hx of CAD s/p DES to OM on 08/2017, HLD on PCSK9i, anxiety and OSA on CPAP who was previously followed by Dr. Meda Coffee who now returns to clinic for follow-up.  Per review of the record, the patient has a long history of chest pain. Myoview 02/2019 low risk. Chest CT 04/02/20 showed chronic scarring in right lower lobe nodule and large hiatal hernia which was felt to be causing some of her symptoms.  Last seen in clinic on 10/05/21 where she was doing very well. Had been losing weight with the keto diet.  Today, the patient states that she has been experiencing palpitations that she feels in her frontal neck, inferior to her chin. These have been occurring frequently: possibly every day but certainly several times in a week. States that this has increased with her stress and anxiety levels as she has been having a lot of family hardship.  Additionally she complains of random chest pain occurring a couple times weekly, while she is at rest or with activity. This pain is happening independently of her palpitations. Her chest pain doesn't feel like acid reflux; she describes it as a squeezing pain in the center of her chest. Initially she tries to drink water and takes a Pepcid, which eventually helps resolve her symptoms. She declined stress testing but will let us know if she would like to purusue.  Since her last visit she had successfully lost weight down to 148 lbs. Over the summer she regained some weight. Currently she is 153 lbs in clinic.  She denies any shortness of breath, or peripheral edema. No lightheadedness, headaches, syncope, orthopnea, or PND.   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 LHC 6/16:  oOM1 60, pRCA 25 >> med Rx b. cath 3/19 2nd OM with 95% stenosis s/p synergy DES & anomalous RCA   CAP (community acquired pneumonia) 2016; 06/07/2016   Chronic bronchitis (HCC)    Chronic lower back pain    Complication of anesthesia    "think I have a hard time waking up from it"   COPD (chronic obstructive pulmonary disease) ([place])    Depression    mild   Diverticulitis    Diverticulosis    GERD (gastroesophageal reflux disease)    H/O hiatal hernia    Headache    "weekly" (08/23/2017)   History of echocardiogram    Echo 6/16:  Mod LVH, EF 60-65%, no RWMA, Gr 1 DD, trivial MR, normal LA size.   Hyperglycemia 11/20/2015   Hyperlipidemia, mixed 09/11/2007   Qualifier: Diagnosis of  By: Jerold Coombe   Did not tolerate Lipitor, zocor, Lovastatin, Pravastatin, Livalo, Crestor even low dose    IBS (irritable bowel syndrome)    Maxillary sinusitis    Normal cardiac stress test 11/2011   No evidence of ischemia or infarct.   Calculated ejection fraction 72%.   Obesity  OSA (obstructive sleep apnea) 02/2012   has stopped using  cpap   Osteoarthritis    Osteoporosis    Pneumonia 11/2011   "before 2013 I hadn't had pneumonia since I was a child" (04/13/2012)   Pulmonary nodules    S/P angioplasty with stent 08/23/17 ostial 2nd OM with DES synnergy 08/24/2017   Schatzki's ring     Past Surgical History:  Procedure Laterality Date   APPENDECTOMY  1989   CARDIAC CATHETERIZATION N/A 11/25/2014   Procedure: Right/Left Heart Cath and Coronary Angiography;  Surgeon: Belva Crome, MD;  [facility];  Service: Cardiovascular;  Laterality: N/A;   [place] WITH STENT PLACEMENT  08/23/2017   CORONARY STENT INTERVENTION N/A 08/23/2017    Procedure: CORONARY STENT INTERVENTION;  Surgeon: Burnell Blanks, MD;  Location: [facility];  Service: Cardiovascular;  Laterality: N/A;   HERNIA REPAIR  04/13/2012   VHR laparoscopic   LEFT HEART CATH AND CORONARY ANGIOGRAPHY N/A 08/23/2017   Procedure: LEFT HEART CATH AND CORONARY ANGIOGRAPHY;  Surgeon: Burnell Blanks, MD;  Location: [facility];  Service: Cardiovascular;  Laterality: N/A;   VENTRAL HERNIA REPAIR  04/13/2012   Procedure: LAPAROSCOPIC VENTRAL HERNIA;  Surgeon: Adin Hector, MD;  Location: [facility];  Service: General;  Laterality: N/A;  laparoscopic repair of incarcerated hernia    Current Medications: Current Meds  Medication Sig   albuterol (VENTOLIN HFA) 108 (90 Base) MCG/ACT inhaler Inhale 1-2 puffs into the lungs every 6 (six) hours as needed.   aspirin 81 MG tablet Take 81 mg by mouth daily.   Benralizumab (FASENRA PEN) 30 MG/ML SOAJ Inject 1 mL (30 mg total) into the skin every 8 (eight) weeks.   Bioflavonoid Products (ESTER C PO) Take 500 mg by mouth daily.   budesonide (ENTOCORT EC) 3 MG 24 hr capsule Take 3 caps a day for 4 weeks then take 2 caps daily for 4 weeks then take 1 capsule daily for 4 weeks.   Calcium-Magnesium-Vitamin D (CALCIUM MAGNESIUM PO) Take 1 tablet by mouth daily.   Coenzyme Q10 (CO Q 10 PO) Take by mouth daily.   DULoxetine (CYMBALTA) 30 MG capsule 2 caps po q am and 1 cap q pm   Evolocumab (REPATHA SURECLICK) 383 MG/ML SOAJ INJECT 1 PEN INTO THE SKIN EVERY 14 DAYS (Patient taking differently: INJECT 1 PEN INTO THE SKIN EVERY 21 DAYS)   famotidine (PEPCID) 20 MG tablet TAKE 1 TABLET BY MOUTH TWICE A DAY   HYDROcodone bit-homatropine (HYCODAN) 5-1.5 MG/5ML syrup Take 5 mLs by mouth every 8 (eight) hours as needed for cough.   ipratropium-albuterol (DUONEB) 0.5-2.5 (3) MG/3ML SOLN Take 3 mLs by nebulization every 6 (six) hours as needed.   LORazepam (ATIVAN) 1 MG tablet TAKE 1/2 TO 1 TABLET BY MOUTH 2 TIMES A DAY AS  NEEDED FOR ANXIETY OR SLEEP   nitroGLYCERIN (NITROSTAT) 0.4 MG SL tablet PLACE 1 TABLET UNDER THE TONGUE EVERY 5 MINUTES AS NEEDED FOR CHEST PAIN.   OLANZapine (ZYPREXA) 5 MG tablet Take 1 tablet (5 mg total) by mouth at bedtime.   PROLIA 60 MG/ML SOSY injection SMARTSIG:60 Milligram(s) SUB-Q Once   VITAMIN D PO Take 5,000 Units by mouth daily.     Allergies:   Beclomethasone dipropionate, Flexeril [cyclobenzaprine], Mometasone furo-formoterol fum, Sulfonamide derivatives, and Statins   Social History   Socioeconomic History   Marital status: Married    Spouse name: Not  on file   Number of children: 1   Years of education: 14   Highest education level: Associate degree: academic program  Occupational History   Occupation: Disabled   Tobacco Use   Smoking status: Never    Passive exposure: Never   Smokeless tobacco: Never  Vaping Use   Vaping Use: Never used  Substance and Sexual Activity   Alcohol use: Yes    Comment: social use   Drug use: No   Sexual activity: Yes    Comment: gluten free, lives with husband and son with CP quadriplegia  Other Topics Concern   Not on file  Social History Narrative   Cares for son with cerebral palsy.    Lives at home with her husband and son.   Right-handed.   2 cups caffeine per day.   One story home   Social Determinants of Health   Financial Resource Strain: Low Risk  (10/19/2021)   Overall Financial Resource Strain (CARDIA)    Difficulty of Paying Living Expenses: Not hard at all  Food Insecurity: No Food Insecurity (10/19/2021)   Hunger Vital Sign    Worried About Running Out of Food in the Last Year: Never true    Ran Out of Food in the Last Year: Never true  Transportation Needs: No Transportation Needs (10/19/2021)   PRAPARE - Hydrologist (Medical): No    Lack of Transportation (Non-Medical): No  Physical Activity: Insufficiently Active (10/19/2021)   Exercise Vital Sign    Days of Exercise per  Week: 7 days    Minutes of Exercise per Session: 20 min  Stress: Stress Concern Present (10/19/2021)   [place]    Feeling of Stress : Rather much  Social Connections: Moderately Integrated (10/19/2021)   Social Connection and Isolation Panel [NHANES]    Frequency of Communication with Friends and Family: More than three times a week    Frequency of Social Gatherings with Friends and Family: Once a week    Attends Religious Services: More than 4 times per year    Active Member of [employer] or Organizations: No    Attends Music therapist: Never    Marital Status: Married     Family History: The patient's family history includes Breast cancer in her mother and sister; Cancer in her mother and sister; Cancer (age of onset: 64) in her niece; Cerebral palsy in her son; Diabetes in her mother; Diverticulosis in her father; Heart attack in her maternal grandfather; Hypertension in her mother; Osteoporosis in her niece; Prostate cancer in her father; Pulmonary embolism in her brother. There is no history of Stroke, Colon cancer, Esophageal cancer, Stomach cancer, or Rectal cancer.  ROS:   Please see the history of present illness.    Review of Systems  Constitutional:  Negative for chills and fever.  HENT:  Negative for sore throat.   Eyes:  Negative for redness.  Respiratory:  Negative for cough.   Cardiovascular:  Positive for chest pain and palpitations. Negative for orthopnea, claudication, leg swelling and PND.  Gastrointestinal:  Negative for blood in stool, heartburn and melena.  Genitourinary:  Negative for hematuria.  Musculoskeletal:  Positive for back pain and joint pain (L hip and L knee).  Neurological:  Negative for dizziness and loss of consciousness.  Endo/Heme/Allergies:  Does not bruise/bleed easily.  Psychiatric/Behavioral:  Negative for substance abuse.     EKGs/Labs/Other Studies Reviewed:  The following studies were reviewed today:  CTA Chest 04/16/21 IMPRESSION: 1. No aneurysm or acute findings. 2. Coronary calcifications. The severity of coronary artery disease and any potential stenosis cannot be assessed on this non-gated CT examination. Assessment for potential risk factor modification, dietary therapy or pharmacologic therapy may be warranted, if clinically indicated. 3. Stable moderate hiatal hernia and left diaphragmatic eventration.   Echocardiogram 03/20/2019 IMPRESSIONS  1. Left ventricular ejection fraction, by visual estimation, is 60 to 65%. The left ventricle has normal function. Normal left ventricular size. Mildly increased left ventricular posterior wall thickness. There is mildly increased left ventricular  hypertrophy.  2. Elevated left ventricular end-diastolic pressure.  3. Left ventricular diastolic Doppler parameters are consistent with impaired relaxation pattern of LV diastolic filling.  4. Global right ventricle has normal systolic function.The right ventricular size is normal. No increase in right ventricular wall thickness.  5. Left atrial size was normal.  6. Right atrial size was normal.  7. The mitral valve is normal in structure. No evidence of mitral valve regurgitation. No evidence of mitral stenosis.  8. The tricuspid valve is normal in structure. Tricuspid valve regurgitation is trivial.  9. The aortic valve is normal in structure. Aortic valve regurgitation is mild by color flow Doppler. Structurally normal aortic valve, with no evidence of sclerosis or stenosis. 10. The pulmonic valve was normal in structure. Pulmonic valve regurgitation is not visualized by color flow Doppler. 11. Aortic dilatation noted. 12. There is mild dilatation of the ascending aorta measuring 36 mm. 13. Normal pulmonary artery systolic pressure. 14. The inferior vena cava is normal in size with greater than 50% respiratory variability, suggesting right  atrial pressure of 3 mmHg.   Exercise Myoview 03/15/2019 Study Highlights   Nuclear stress EF: 74%. There was no ST segment deviation noted during stress. No T wave inversion was noted during stress. Blood pressure demonstrated a hypertensive response to exercise. The study is normal. This is a low risk study.   Low risk stress nuclear study with normal perfusion and normal left ventricular regional and global systolic function.      Carotid ultrasound 03/12/2019 Summary: Right Carotid: Velocities in the right ICA are consistent with a 1-39% stenosis.   Left Carotid: Velocities in the left ICA are consistent with a 1-39% stenosis.               Non-hemodynamically significant plaque <50% noted in the CCA.   Vertebrals:  Bilateral vertebral arteries demonstrate antegrade flow. Subclavians: Normal flow hemodynamics were seen in bilateral subclavian              arteries.    Left heart cath 08/23/2017  Conclusion   Ost 2nd Mrg lesion is 95% stenosed. A drug-eluting stent was successfully placed using a STENT SYNERGY DES 3X16. Post intervention, there is a 0% residual stenosis. The left ventricular systolic function is normal. LV end diastolic pressure is normal. The left ventricular ejection fraction is 55-65% by visual estimate. There is no mitral valve regurgitation.   1. Severe single vessel CAD with severe stenosis in the ostium of the large obtuse marginal branch.  2. Successful PTCA/DES x 1 ostium of the OM. 3. 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  4. The LAD has no obstructive disease.  5. Normal LV systolic function   Recommendations: Will continue DAPT with ASA and Plavix for at least one year. Consider addition of a beta blocker  or nitrate.        EKG:  EKG is personally reviewed. 04/07/2022:  EKG was not ordered. 07/06/2021 (Dr. Charlett Blake):  NSR  Recent Labs: 07/06/2021: Magnesium 2.1; TSH  1.82 09/23/2021: ALT 24; BUN 21; Creatinine, Ser 0.57; Hemoglobin 12.9; Platelets 303; Potassium 4.2; Sodium 135   Recent Lipid Panel    Component Value Date/Time   CHOL 172 07/06/2021 1435   CHOL 227 (H) 07/25/2016 0822   TRIG 110.0 07/06/2021 1435   HDL 55.30 07/06/2021 1435   HDL 51 07/25/2016 0822   CHOLHDL 3 07/06/2021 1435   VLDL 22.0 07/06/2021 1435   LDLCALC 95 07/06/2021 1435   LDLCALC 160 (H) 07/25/2016 0822   LDLDIRECT 177.8 09/07/2011 0945     Physical Exam:    VS:  BP 112/70 (BP Location: Left Arm, Patient Position: Sitting, Cuff Size: Normal)   Pulse 70   Ht 5' 1"  (1.549 m)   Wt 153 lb (69.4 kg)   SpO2 98%   BMI 28.91 kg/m     Wt Readings from Last 3 Encounters:  04/07/22 153 lb (69.4 kg)  03/21/22 149 lb (67.6 kg)  03/07/22 154 lb 6.4 oz (70 kg)     GEN:  Well nourished, well developed in no acute distress HEENT: Normal NECK: No JVD; No carotid bruits CARDIAC: RRR, 1/6 systolic murmur, no rubs, no gallops RESPIRATORY:  Clear to auscultation without rales, wheezing or rhonchi  ABDOMEN: Soft, non-tender, non-distended MUSCULOSKELETAL:  No edema; No deformity  SKIN: Warm and dry. No edema NEUROLOGIC:  Alert and oriented x 3 PSYCHIATRIC:  Normal affect   ASSESSMENT:    1. Palpitations   2. Coronary artery disease due to lipid rich plaque   3. Statin myopathy   4. OSA (obstructive sleep apnea)   5. Severe persistent asthma, unspecified whether complicated   6. Iron deficiency anemia, unspecified iron deficiency anemia type   7. Precordial pain   8. Hyperlipidemia, unspecified hyperlipidemia type      PLAN:    In order of problems listed above:  #Palpitations: Unclear if palpitations vs muscle spasms in the neck. Has been occurring several times per week. Will check cardiac monitor for further evaluation and see if symptoms correlate with ectopy or arrhythmias. -Check 7 day zio  #CAD s/p DES to OM on 08/2017: Stable with episodes of atypical  chest pain. Myoview 02/2019 showed normal LVEF with normal perfusion. Declined repeat myoview at this time. -Continue ASA 31m daily -Continue repatha 1462mevery 3 weeks (does not tolerate q 2 weeks due to muscle aches) -No BB due to severe asthma  #GIB: #Iron deficiency anemia: No current bleeding. S/p iron infusions x2. Follows with Heme. -Continue famotidine -Follow-up with heme as scheduled -If needed, can use PPI  #Severe Asthma: -Followed Ramaswamy -No BB due to severity of asthma  #HLD: #Statin Interolerance: LDL 85 in 06/2020. Overall near goal of 70 and did not tolerate statins or q2 week repatha. Will continue current regimen. -Continue repatha 14078m 3 weeks (has adjusted dosing to minimize side-effects)  #OSA: -Compliant with home CPAP  #Mild Ascending Aortic Aneurysm: Measured 50m50m TTE in 2020. Normal on CTA. No further monitoring needed.    Follow-up:  6 months.  Medication Adjustments/Labs and Tests Ordered: Current medicines are reviewed at length with the patient today.  Concerns regarding medicines are outlined above.   Orders Placed This Encounter  Procedures   LONG TERM MONITOR (3-14 DAYS)   No orders of the defined types  were placed in this encounter.  Patient Instructions  Medication Instructions:   Your physician recommends that you continue on your current medications as directed. Please refer to the Current Medication list given to you today.  *If you need a refill on your cardiac medications before your next appointment, please call your pharmacy*    Testing/Procedures:  [place] Monitor Instructions  Your physician has requested you wear a ZIO patch monitor for 7  days.  This is a single patch monitor. [employer] supplies one patch monitor per enrollment. Additional stickers are not available. Please do not apply patch if you will be having a Nuclear Stress Test,  Echocardiogram, Cardiac CT, MRI, or Chest Xray during the  period you would be wearing the  monitor. The patch cannot be worn during these tests. You cannot remove and re-apply the  ZIO XT patch monitor.  Your ZIO patch monitor will be mailed 3 day USPS to your address on file. It may take 3-5 days  to receive your monitor after you have been enrolled.  Once you have received your monitor, please review the enclosed instructions. Your monitor  has already been registered assigning a specific monitor serial # to you.  Billing and Patient Assistance Program Information  We have supplied [employer] with any of your insurance information on file for billing purposes. [employer] offers a sliding scale Patient Assistance Program for patients that do not have  insurance, or whose insurance does not completely cover the cost of the ZIO monitor.  You must apply for the Patient Assistance Program to qualify for this discounted rate.  To apply, please call [employer] at [phone], select option 4, select option 2, ask to apply for  Patient Assistance Program. Theodore Demark will ask your household income, and how many people  are in your household. They will quote your out-of-pocket cost based on that information.  [employer] will also be able to set up a 47-month interest-free payment plan if needed.  Applying the monitor   Shave hair from upper left chest.  Hold abrader disc by orange tab. Rub abrader in 40 strokes over the upper left chest as  indicated in your monitor instructions.  Clean area with 4 enclosed alcohol pads. Let dry.  Apply patch as indicated in monitor instructions. Patch will be placed under collarbone on left  side of chest with arrow pointing upward.  Rub patch adhesive wings for 2 minutes. Remove white label marked "1". Remove the white  label marked "2". Rub patch adhesive wings for 2 additional minutes.  While looking in a mirror, press and release button in center of patch. A small green light will  flash 3-4 times. This will be your only  indicator that the monitor has been turned on.  Do not shower for the first 24 hours. You may shower after the first 24 hours.  Press the button if you feel a symptom. You will hear a small click. Record Date, Time and  Symptom in the Patient Logbook.  When you are ready to remove the patch, follow instructions on the last 2 pages of Patient  Logbook. Stick patch monitor onto the last page of Patient Logbook.  Place Patient Logbook in the blue and white box. Use locking tab on box and tape box closed  securely. The blue and white box has prepaid postage on it. Please place it in the mailbox as  soon as possible. Your physician should have your test results approximately 7 days after the  monitor has been mailed back to [place].  Call [place] at [phone] if you have questions regarding  your ZIO XT patch monitor. Call them immediately if you see an orange light blinking on your  monitor.  If your monitor falls off in less than 4 days, contact our Monitor department at [phone].  If your monitor becomes loose or falls off after 4 days call [employer] at [phone] for  suggestions on securing your monitor    Follow-Up: At [facility], you and your health needs are our priority.  As part of our continuing mission to provide you with exceptional heart care, we have created designated Provider Care Teams.  These Care Teams include your primary Cardiologist (physician) and Advanced Practice Providers (APPs -  Physician Assistants and Nurse Practitioners) who all work together to provide you with the care you need, when you need it.  We recommend signing up for the patient portal called "MyChart".  Sign up information is provided on this After Visit Summary.  MyChart is used to connect with patients for Virtual Visits (Telemedicine).  Patients are able to view lab/test results, encounter notes, upcoming appointments, etc.  Non-urgent messages can be sent  to your provider as well.   To learn more about what you can do with MyChart, go to [email or web address].    Your next appointment:   6 month(s)  The format for your next appointment:   In Person  Provider:   Robbie Lis, PA-C, Nicholes Rough, PA-C, Melina Copa, PA-C, Ambrose Pancoast, NP, Cecilie Kicks, NP, Ermalinda Barrios, PA-C, Christen Bame, NP, or Richardson Dopp, PA-C          Important Information About Sugar        I,Mathew Stumpf,acting as a scribe for Freada Bergeron, MD.,have documented all relevant documentation on the behalf of Freada Bergeron, MD,as directed by  Freada Bergeron, MD while in the presence of Freada Bergeron, MD.  I, Freada Bergeron, MD, have reviewed all documentation for this visit. The documentation on 04/07/22 for the exam, diagnosis, procedures, and orders are all accurate and complete.   Signed, Freada Bergeron, MD  04/07/2022 12:25 PM    [place]

## 2022-04-07 NOTE — Progress Notes (Signed)
Office Visit Note  Patient: Victoria [name]             Date of Birth: 1956/01/07           MRN: [id number]             PCP: Mosie Lukes, MD Referring: Mosie Lukes, MD Visit Date: 04/20/2022 Occupation: @GUAROCC @  Subjective:   joint stiffness  History of Present Illness: Victoria [name] is a 66 y.o. female history of osteoarthritis.  She states she had colonoscopy by Dr. Hilarie Fredrickson and was diagnosed with microcytic lymphocytic colitis.  She was started on budesonide which has been helping her symptoms.  She states her knee joint discomfort has resolved.  She continues to have some discomfort in her hands and her knee joints which is manageable.  Thoracic and lower back pain persists.  She states her SI joint pain has improved since she has been using a shoe lift in her right shoe.  She denies any increased muscular weakness or tenderness.  She had no recurrence of scleritis.  She states she has some irritation in her left eye and was told that she had a stye in her eye.  Activities of Daily Living:  Patient reports morning stiffness for 1 hour.   Patient Denies nocturnal pain.  Difficulty dressing/grooming: Denies Difficulty climbing stairs: Denies Difficulty getting out of chair: Denies Difficulty using hands for taps, buttons, cutlery, and/or writing: Denies  Review of Systems  Constitutional:  Negative for fatigue.  HENT:  Negative for mouth sores and mouth dryness.   Eyes:  Negative for dryness.  Respiratory:  Negative for shortness of breath.   Cardiovascular:  Negative for chest pain and palpitations.  Gastrointestinal:  Negative for blood in stool, constipation and diarrhea.  Endocrine: Negative for increased urination.  Genitourinary:  Negative for involuntary urination.  Musculoskeletal:  Positive for morning stiffness. Negative for joint pain, gait problem, joint pain, joint swelling, myalgias, muscle weakness, muscle tenderness and myalgias.  Skin:  Negative for color  change, rash, hair loss and sensitivity to sunlight.  Allergic/Immunologic: Negative for susceptible to infections.  Neurological:  Negative for dizziness and headaches.  Hematological:  Negative for swollen glands.  Psychiatric/Behavioral:  Negative for depressed mood and sleep disturbance. The patient is not nervous/anxious.     PMFS History:  Patient Active Problem List   Diagnosis Date Noted   Scleritis of right eye 02/18/2022   Diarrhea 11/16/2021   Lumbar radiculopathy 09/20/2021   Sacroiliac joint dysfunction of right side 08/16/2021   Greater trochanteric pain syndrome of left lower extremity 07/29/2021   Diverticulitis 07/07/2021   Iron deficiency anemia 09/21/2020   Family history of breast cancer 09/21/2020   Preventative health care 07/01/2020   Hemorrhoids 07/01/2020   LUQ discomfort 07/01/2020   Statin intolerance 01/10/2020   Occipital pain 06/27/2019   Tinnitus of both ears 06/27/2019   Statin myopathy 06/19/2019   Other cervical disc degeneration, unspecified cervical region 06/03/2019   Headache 03/19/2019   Tinnitus 03/19/2019   Heart disease 02/21/2019   Neck pain 02/17/2019   Toe pain, right 10/09/2018   Hx of cold sores 10/09/2018   Bronchitis with asthma, acute 07/18/2018   Osteoporosis 05/23/2018   S/P angioplasty with stent 08/23/17 ostial 2nd OM with DES synnergy 08/24/2017   CAD (coronary artery disease)    Anxiety and depression 01/16/2017   Insomnia 01/16/2017   Acute recurrent sinusitis 05/27/2016   Muscle spasm 05/03/2016   Chronic pain 05/03/2016  Severe persistent asthma with intensive monitoring 03/22/2016   BMI 30.0-30.9,adult 02/18/2016   MRSA (methicillin resistant Staphylococcus aureus) colonization 01/12/2016   Sleep disorder due to stress 01/12/2016   Hyperglycemia 11/20/2015   Chronic diastolic CHF (congestive heart failure), NYHA class 2 ([place]) 06/26/2015   Coronary artery disease due to lipid rich plaque 28/78/6767    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[place]) 20/94/7096   Cardiomegaly 11/21/2014   Seasonal and perennial allergic rhinitis 10/22/2011   Fatigue 09/01/2011   Chest pain, atypical 06/01/2011   OSA (obstructive sleep apnea) 28/36/6294   UMBILICAL HERNIA 76/54/6503   DIVERTICULOSIS, COLON 06/11/2010   HEPATIC CYST 06/11/2010   Idiopathic scoliosis and kyphoscoliosis 06/11/2010   TRANSAMINASES, SERUM, ELEVATED 05/27/2010   Abdominal pain 01/15/2010   ELEVATED BP READING WITHOUT DX HYPERTENSION 08/03/2009   Gastroesophageal reflux disease with hiatal hernia 10/21/2008   h/o PALPITATIONS 08/19/2008   A B P A-ALLERGIC BRONCHOPULMONARY ASPERGILLOSIS 12/14/2007   Asthma 11/16/2007   Hyperlipidemia, mixed 09/11/2007   STRESS INCONTINENCE 09/11/2007   Cushing's syndrome ([place]) 12/11/2006   OA (osteoarthritis) of knee 12/11/2006    Past Medical History:  Diagnosis Date   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 LHC 6/16:  oOM1 60, pRCA 25 >> med Rx b. cath 3/19 2nd OM with 95% stenosis s/p synergy DES & anomalous RCA   CAP (community acquired pneumonia) 2016; 06/07/2016   Chronic bronchitis (HCC)    Chronic lower back pain    Complication of anesthesia    "think I have a hard time waking up from it"   COPD (chronic obstructive pulmonary disease) ([place])    Depression    mild   Diverticulitis    Diverticulosis    GERD (gastroesophageal reflux disease)    H/O hiatal hernia    Headache    "weekly" (08/23/2017)   History of echocardiogram    Echo 6/16:  Mod LVH, EF 60-65%, no RWMA, Gr 1 DD, trivial MR, normal LA size.   Hyperglycemia 11/20/2015   Hyperlipidemia, mixed 09/11/2007   Qualifier: Diagnosis of  By: Jerold Coombe   Did not tolerate Lipitor, zocor, Lovastatin, Pravastatin, Livalo, Crestor even low dose    IBS (irritable bowel syndrome)    Maxillary  sinusitis    Normal cardiac stress test 11/2011   No evidence of ischemia or infarct.   Calculated ejection fraction 72%.   Obesity    OSA (obstructive sleep apnea) 02/2012   has stopped using  cpap   Osteoarthritis    Osteoporosis    Pneumonia 11/2011   "before 2013 I hadn't had pneumonia since I was a child" (04/13/2012)   Pulmonary nodules    S/P angioplasty with stent 08/23/17 ostial 2nd OM with DES synnergy 08/24/2017   Schatzki's ring     Family History  Problem Relation Age of Onset   Breast cancer Mother    Hypertension Mother    Diabetes Mother    Cancer Mother        recurrent, metastatic breast cancer.    Diverticulosis Father    Prostate cancer Father    Breast cancer Sister    Cancer Sister        breast cancer, invasive ductal carcinoma in 2022,DCIS at 60 with 4 weeks of radiation, 5 years of Tamoxifen    Pulmonary embolism Brother  recurrent   Heart attack Maternal Grandfather    Cerebral palsy Son    Cancer Niece 49   Osteoporosis Niece    Stroke Neg Hx    Colon cancer Neg Hx    Esophageal cancer Neg Hx    Stomach cancer Neg Hx    Rectal cancer Neg Hx    Past Surgical History:  Procedure Laterality Date   APPENDECTOMY  1989   CARDIAC CATHETERIZATION N/A 11/25/2014   Procedure: Right/Left Heart Cath and Coronary Angiography;  Surgeon: Belva Crome, MD;  Location: [facility];  Service: Cardiovascular;  Laterality: N/A;   [place]   COLONOSCOPY  03/2022   CORONARY ANGIOPLASTY WITH STENT PLACEMENT  08/23/2017   CORONARY STENT INTERVENTION N/A 08/23/2017   Procedure: CORONARY STENT INTERVENTION;  Surgeon: Burnell Blanks, MD;  Location: [facility];  Service: Cardiovascular;  Laterality: N/A;   HERNIA REPAIR  04/13/2012   VHR laparoscopic   LEFT HEART CATH AND CORONARY ANGIOGRAPHY N/A 08/23/2017   Procedure: LEFT HEART CATH AND CORONARY ANGIOGRAPHY;  Surgeon: Burnell Blanks, MD;  Location: [facility];   Service: Cardiovascular;  Laterality: N/A;   VENTRAL HERNIA REPAIR  04/13/2012   Procedure: LAPAROSCOPIC VENTRAL HERNIA;  Surgeon: Adin Hector, MD;  Location: [facility];  Service: General;  Laterality: N/A;  laparoscopic repair of incarcerated hernia   Social History   Social History Narrative   Cares for son with cerebral palsy.    Lives at home with her husband and son.   Right-handed.   2 cups caffeine per day.   One story home   Immunization History  Administered Date(s) Administered   Influenza Split 04/20/2011, 04/20/2020   Influenza Whole 06/06/2007, 04/15/2008, 04/02/2009, 03/29/2012   Influenza, High Dose Seasonal PF 05/05/2015   Influenza,inj,Quad PF,6+ Mos 05/09/2013, 03/03/2014, 05/03/2016, 04/21/2017, 04/02/2018, 03/19/2019, 04/20/2020, 05/03/2021, 03/07/2022   Moderna Sars-Covid-2 Vaccination 08/15/2019, 09/17/2019, 05/16/2020, 12/04/2020   Pfizer Covid-19 Vaccine Bivalent Booster 56yr & up 04/16/2021   Pneumococcal Conjugate-13 05/09/2013   Pneumococcal Polysaccharide-23 05/04/2005   Td 07/29/2009   Tdap 03/11/2015   Zoster Recombinat (Shingrix) 05/09/2019, 08/01/2019     Objective: Vital Signs: BP 119/76 (BP Location: Left Arm, Patient Position: Sitting, Cuff Size: Normal)   Pulse 70   Resp 15   Ht 5' (1.524 m)   Wt 154 lb (69.9 kg)   BMI 30.08 kg/m    Physical Exam Vitals and nursing note reviewed.  Constitutional:      Appearance: She is well-developed.  HENT:     Head: Normocephalic and atraumatic.  Eyes:     Conjunctiva/sclera: Conjunctivae normal.  Cardiovascular:     Rate and Rhythm: Normal rate and regular rhythm.     Heart sounds: Normal heart sounds.  Pulmonary:     Effort: Pulmonary effort is normal.     Breath sounds: Normal breath sounds.  Abdominal:     General: Bowel sounds are normal.     Palpations: Abdomen is soft.  Musculoskeletal:     Cervical back: Normal range of motion.  Lymphadenopathy:     Cervical: No cervical  adenopathy.  Skin:    General: Skin is warm and dry.     Capillary Refill: Capillary refill takes less than 2 seconds.  Neurological:     Mental Status: She is alert and oriented to person, place, and time.  Psychiatric:        Behavior: Behavior normal.  Musculoskeletal Exam: She has stiffness and discomfort with lateral rotation of the cervical spine.  Thoracic kyphosis and thoracolumbar scoliosis was noted.  Shoulder joints, elbow joints, wrist joints with good range of motion.  She had no MCP PIP or DIP thickening.  Hip joints with good range of motion.  She had no tenderness on palpation of her knee joints or ankle joints.  There was no MTP tenderness.  CDAI Exam: CDAI Score: -- Patient Global: --; Provider Global: -- Swollen: --; Tender: -- Joint Exam 04/20/2022   No joint exam has been documented for this visit   There is currently no information documented on the homunculus. Go to the Rheumatology activity and complete the homunculus joint exam.  Investigation: No additional findings.  Imaging: No results found.  Recent Labs: Lab Results  Component Value Date   WBC 8.5 09/23/2021   HGB 12.9 09/23/2021   PLT 303 09/23/2021   NA 135 09/23/2021   K 4.2 09/23/2021   CL 99 09/23/2021   CO2 21 09/23/2021   GLUCOSE 90 09/23/2021   BUN 21 09/23/2021   CREATININE 0.57 09/23/2021   BILITOT <0.2 09/23/2021   ALKPHOS 63 09/23/2021   AST 20 09/23/2021   ALT 24 09/23/2021   PROT 6.3 09/23/2021   ALBUMIN 4.5 09/23/2021   CALCIUM 9.2 09/23/2021   GFRAA >60 08/24/2017    Speciality Comments: No specialty comments available.  Procedures:  No procedures performed Allergies: Beclomethasone dipropionate, Flexeril [cyclobenzaprine], Mometasone furo-formoterol fum, Sulfonamide derivatives, and Statins   Assessment / Plan:     Visit Diagnoses: Positive ANA (antinuclear antibody) - Positive ANA, ENA negative, complements normal, sed rate normal.  Patient had low titer  positive ANA.  ENA negative, complements normal, sed rate normal.  Patient denies any history of oral ulcers, nasal ulcers, malar rash, photosensitivity, lymphadenopathy or Raynaud's phenomenon.  No synovitis was noted on the examination.  Polyarthralgia - History of polyarthralgia since she was in her 40s.  History of pain in upper extremities, lower extremities and entire spine.  She continues to have some generalized pain and discomfort.  No synovitis was noted.  Trochanteric bursitis, left hip-improved.  Chronic pain of both knees -she is planes of pain and discomfort in the bilateral knee joints.  No warmth swelling or effusion was noted.  X-rays of the left knee joint were unremarkable.  She states that her knee joint discomfort is improved since she has been on budesonide.  Scleritis of right eye-she states she had recent redness in her left eye which was diagnosed as diet by her ophthalmologist.  She has not had recurrence of scleritis.  I will hold off any further work-up at this time.  DDD (degenerative disc disease), thoracic -chronic pain.  Multilevel spondylosis and facet joint arthropathy.  She gets epidural injections.  DDD (degenerative disc disease), lumbar -she continues to have lower back pain.  She has severe scoliosis and multilevel spondylosis and facet joint arthropathy.  Idiopathic scoliosis and kyphoscoliosis -she has been using a shoe lift which has been helpful in relieving discomfort.  Sacroiliac joint dysfunction of right side-improved with shoe lift.  Elevated CK-her CK stays elevated although aldolase, HMG CR antibody and myositis panel was negative.  Most likely statin induced to elevated CK.  Statin myopathy - states she developed muscle pain due to his statin use in the past.   Age-related osteoporosis without current pathological fracture - Treated with Fosamax in the past.  Currently on Prolia injections since May 2022.  Other medical problems listed as  follows:  Lymphocytic colitis-patient states she was recently diagnosed with lymphocytic colitis and is responding well to budesonide.  Varicose veins of bilateral lower extremities with other complications  Chronic diastolic CHF (congestive heart failure), NYHA class 2 ([place])  COVID-19 virus infection - patient developed COVID-19 infection and was treated with molnupiravir.  Coronary artery disease involving native coronary artery of native heart with unstable angina pectoris (HCC)  Cardiomegaly  S/P angioplasty with stent 08/23/17 ostial 2nd OM with DES synnergy  Hyperlipidemia, mixed  Seasonal and perennial allergic rhinitis  A B P A-ALLERGIC BRONCHOPULMONARY ASPERGILLOSIS  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[place])  History of diverticulosis  OSA (obstructive sleep apnea)  Chronic diarrhea - She is scheduled to have colonoscopy by Dr. Hilarie Fredrickson.  Cushing's syndrome (HCC)  Hepatic cyst  Gastroesophageal reflux disease with hiatal hernia  MRSA (methicillin resistant Staphylococcus aureus) colonization  Restless leg syndrome  Family history of breast cancer - mother, sister  Orders: No orders of the defined types were placed in this encounter.  No orders of the defined types were placed in this encounter.    Follow-Up Instructions: Return in about 6 months (around 10/19/2022) for Osteoarthritis.   Bo Merino, MD  Note - This record has been created using Editor, commissioning.  Chart creation errors have been sought, but may not always  have been located. Such creation errors do not reflect on  the standard of medical care.

## 2022-04-14 ENCOUNTER — Ambulatory Visit: Payer: PPO | Admitting: Rheumatology

## 2022-04-14 DIAGNOSIS — G2581 Restless legs syndrome: Secondary | ICD-10-CM

## 2022-04-14 DIAGNOSIS — Z22322 Carrier or suspected carrier of Methicillin resistant Staphylococcus aureus: Secondary | ICD-10-CM

## 2022-04-14 DIAGNOSIS — R748 Abnormal levels of other serum enzymes: Secondary | ICD-10-CM

## 2022-04-14 DIAGNOSIS — M5136 Other intervertebral disc degeneration, lumbar region: Secondary | ICD-10-CM

## 2022-04-14 DIAGNOSIS — Z8719 Personal history of other diseases of the digestive system: Secondary | ICD-10-CM

## 2022-04-14 DIAGNOSIS — Z803 Family history of malignant neoplasm of breast: Secondary | ICD-10-CM

## 2022-04-14 DIAGNOSIS — M5134 Other intervertebral disc degeneration, thoracic region: Secondary | ICD-10-CM

## 2022-04-14 DIAGNOSIS — K529 Noninfective gastroenteritis and colitis, unspecified: Secondary | ICD-10-CM

## 2022-04-14 DIAGNOSIS — B4481 Allergic bronchopulmonary aspergillosis: Secondary | ICD-10-CM

## 2022-04-14 DIAGNOSIS — G8929 Other chronic pain: Secondary | ICD-10-CM

## 2022-04-14 DIAGNOSIS — R768 Other specified abnormal immunological findings in serum: Secondary | ICD-10-CM

## 2022-04-14 DIAGNOSIS — G4733 Obstructive sleep apnea (adult) (pediatric): Secondary | ICD-10-CM

## 2022-04-14 DIAGNOSIS — M533 Sacrococcygeal disorders, not elsewhere classified: Secondary | ICD-10-CM

## 2022-04-14 DIAGNOSIS — F419 Anxiety disorder, unspecified: Secondary | ICD-10-CM

## 2022-04-14 DIAGNOSIS — Z95820 Peripheral vascular angioplasty status with implants and grafts: Secondary | ICD-10-CM

## 2022-04-14 DIAGNOSIS — J8283 Eosinophilic asthma: Secondary | ICD-10-CM

## 2022-04-14 DIAGNOSIS — M4120 Other idiopathic scoliosis, site unspecified: Secondary | ICD-10-CM

## 2022-04-14 DIAGNOSIS — J302 Other seasonal allergic rhinitis: Secondary | ICD-10-CM

## 2022-04-14 DIAGNOSIS — E249 Cushing's syndrome, unspecified: Secondary | ICD-10-CM

## 2022-04-14 DIAGNOSIS — M7062 Trochanteric bursitis, left hip: Secondary | ICD-10-CM

## 2022-04-14 DIAGNOSIS — I83893 Varicose veins of bilateral lower extremities with other complications: Secondary | ICD-10-CM

## 2022-04-14 DIAGNOSIS — H15001 Unspecified scleritis, right eye: Secondary | ICD-10-CM

## 2022-04-14 DIAGNOSIS — K7689 Other specified diseases of liver: Secondary | ICD-10-CM

## 2022-04-14 DIAGNOSIS — I25110 Atherosclerotic heart disease of native coronary artery with unstable angina pectoris: Secondary | ICD-10-CM

## 2022-04-14 DIAGNOSIS — U071 COVID-19: Secondary | ICD-10-CM

## 2022-04-14 DIAGNOSIS — I517 Cardiomegaly: Secondary | ICD-10-CM

## 2022-04-14 DIAGNOSIS — M2550 Pain in unspecified joint: Secondary | ICD-10-CM

## 2022-04-14 DIAGNOSIS — E782 Mixed hyperlipidemia: Secondary | ICD-10-CM

## 2022-04-14 DIAGNOSIS — M810 Age-related osteoporosis without current pathological fracture: Secondary | ICD-10-CM

## 2022-04-14 DIAGNOSIS — T466X5A Adverse effect of antihyperlipidemic and antiarteriosclerotic drugs, initial encounter: Secondary | ICD-10-CM

## 2022-04-14 DIAGNOSIS — I5032 Chronic diastolic (congestive) heart failure: Secondary | ICD-10-CM

## 2022-04-14 DIAGNOSIS — K219 Gastro-esophageal reflux disease without esophagitis: Secondary | ICD-10-CM

## 2022-04-16 DIAGNOSIS — R002 Palpitations: Secondary | ICD-10-CM

## 2022-04-18 DIAGNOSIS — N8189 Other female genital prolapse: Secondary | ICD-10-CM | POA: Diagnosis not present

## 2022-04-20 ENCOUNTER — Ambulatory Visit: Payer: PPO | Attending: Rheumatology | Admitting: Rheumatology

## 2022-04-20 ENCOUNTER — Encounter: Payer: Self-pay | Admitting: Rheumatology

## 2022-04-20 VITALS — BP 119/76 | HR 70 | Resp 15 | Ht 60.0 in | Wt 154.0 lb

## 2022-04-20 DIAGNOSIS — I83893 Varicose veins of bilateral lower extremities with other complications: Secondary | ICD-10-CM

## 2022-04-20 DIAGNOSIS — K7689 Other specified diseases of liver: Secondary | ICD-10-CM

## 2022-04-20 DIAGNOSIS — G4733 Obstructive sleep apnea (adult) (pediatric): Secondary | ICD-10-CM

## 2022-04-20 DIAGNOSIS — R768 Other specified abnormal immunological findings in serum: Secondary | ICD-10-CM | POA: Diagnosis not present

## 2022-04-20 DIAGNOSIS — G720 Drug-induced myopathy: Secondary | ICD-10-CM

## 2022-04-20 DIAGNOSIS — K529 Noninfective gastroenteritis and colitis, unspecified: Secondary | ICD-10-CM

## 2022-04-20 DIAGNOSIS — K219 Gastro-esophageal reflux disease without esophagitis: Secondary | ICD-10-CM

## 2022-04-20 DIAGNOSIS — M25561 Pain in right knee: Secondary | ICD-10-CM | POA: Diagnosis not present

## 2022-04-20 DIAGNOSIS — J302 Other seasonal allergic rhinitis: Secondary | ICD-10-CM

## 2022-04-20 DIAGNOSIS — M2550 Pain in unspecified joint: Secondary | ICD-10-CM | POA: Diagnosis not present

## 2022-04-20 DIAGNOSIS — M5134 Other intervertebral disc degeneration, thoracic region: Secondary | ICD-10-CM | POA: Diagnosis not present

## 2022-04-20 DIAGNOSIS — Z803 Family history of malignant neoplasm of breast: Secondary | ICD-10-CM

## 2022-04-20 DIAGNOSIS — G8929 Other chronic pain: Secondary | ICD-10-CM

## 2022-04-20 DIAGNOSIS — E249 Cushing's syndrome, unspecified: Secondary | ICD-10-CM

## 2022-04-20 DIAGNOSIS — I517 Cardiomegaly: Secondary | ICD-10-CM

## 2022-04-20 DIAGNOSIS — I5032 Chronic diastolic (congestive) heart failure: Secondary | ICD-10-CM

## 2022-04-20 DIAGNOSIS — K52832 Lymphocytic colitis: Secondary | ICD-10-CM

## 2022-04-20 DIAGNOSIS — M810 Age-related osteoporosis without current pathological fracture: Secondary | ICD-10-CM | POA: Diagnosis not present

## 2022-04-20 DIAGNOSIS — H15001 Unspecified scleritis, right eye: Secondary | ICD-10-CM | POA: Diagnosis not present

## 2022-04-20 DIAGNOSIS — Z22322 Carrier or suspected carrier of Methicillin resistant Staphylococcus aureus: Secondary | ICD-10-CM

## 2022-04-20 DIAGNOSIS — M533 Sacrococcygeal disorders, not elsewhere classified: Secondary | ICD-10-CM

## 2022-04-20 DIAGNOSIS — M5136 Other intervertebral disc degeneration, lumbar region: Secondary | ICD-10-CM | POA: Diagnosis not present

## 2022-04-20 DIAGNOSIS — J8283 Eosinophilic asthma: Secondary | ICD-10-CM

## 2022-04-20 DIAGNOSIS — M4120 Other idiopathic scoliosis, site unspecified: Secondary | ICD-10-CM

## 2022-04-20 DIAGNOSIS — R7689 Other specified abnormal immunological findings in serum: Secondary | ICD-10-CM

## 2022-04-20 DIAGNOSIS — Z95820 Peripheral vascular angioplasty status with implants and grafts: Secondary | ICD-10-CM

## 2022-04-20 DIAGNOSIS — K449 Diaphragmatic hernia without obstruction or gangrene: Secondary | ICD-10-CM

## 2022-04-20 DIAGNOSIS — M25562 Pain in left knee: Secondary | ICD-10-CM

## 2022-04-20 DIAGNOSIS — R748 Abnormal levels of other serum enzymes: Secondary | ICD-10-CM | POA: Diagnosis not present

## 2022-04-20 DIAGNOSIS — M51369 Other intervertebral disc degeneration, lumbar region without mention of lumbar back pain or lower extremity pain: Secondary | ICD-10-CM

## 2022-04-20 DIAGNOSIS — U071 COVID-19: Secondary | ICD-10-CM

## 2022-04-20 DIAGNOSIS — J3089 Other allergic rhinitis: Secondary | ICD-10-CM

## 2022-04-20 DIAGNOSIS — I25110 Atherosclerotic heart disease of native coronary artery with unstable angina pectoris: Secondary | ICD-10-CM

## 2022-04-20 DIAGNOSIS — M7062 Trochanteric bursitis, left hip: Secondary | ICD-10-CM | POA: Diagnosis not present

## 2022-04-20 DIAGNOSIS — G2581 Restless legs syndrome: Secondary | ICD-10-CM

## 2022-04-20 DIAGNOSIS — Z8719 Personal history of other diseases of the digestive system: Secondary | ICD-10-CM

## 2022-04-20 DIAGNOSIS — T466X5A Adverse effect of antihyperlipidemic and antiarteriosclerotic drugs, initial encounter: Secondary | ICD-10-CM

## 2022-04-20 DIAGNOSIS — E782 Mixed hyperlipidemia: Secondary | ICD-10-CM

## 2022-04-20 DIAGNOSIS — B4481 Allergic bronchopulmonary aspergillosis: Secondary | ICD-10-CM

## 2022-04-21 ENCOUNTER — Other Ambulatory Visit: Payer: Self-pay | Admitting: Obstetrics and Gynecology

## 2022-04-21 DIAGNOSIS — R898 Other abnormal findings in specimens from other organs, systems and tissues: Secondary | ICD-10-CM

## 2022-04-23 ENCOUNTER — Other Ambulatory Visit: Payer: Self-pay | Admitting: Family Medicine

## 2022-04-27 DIAGNOSIS — R002 Palpitations: Secondary | ICD-10-CM | POA: Diagnosis not present

## 2022-04-28 ENCOUNTER — Ambulatory Visit: Payer: PPO | Admitting: Family Medicine

## 2022-04-29 DIAGNOSIS — M25552 Pain in left hip: Secondary | ICD-10-CM | POA: Diagnosis not present

## 2022-05-03 ENCOUNTER — Ambulatory Visit: Payer: PPO | Admitting: Family Medicine

## 2022-05-09 ENCOUNTER — Encounter: Payer: Self-pay | Admitting: Hematology and Oncology

## 2022-05-09 ENCOUNTER — Other Ambulatory Visit (HOSPITAL_COMMUNITY): Payer: Self-pay

## 2022-05-09 NOTE — Telephone Encounter (Signed)
Forwarding to Rx Prior [employer].

## 2022-05-09 NOTE — Telephone Encounter (Signed)
Prolia VOB initiated via [email or web address] 

## 2022-05-10 DIAGNOSIS — M6281 Muscle weakness (generalized): Secondary | ICD-10-CM | POA: Diagnosis not present

## 2022-05-10 DIAGNOSIS — M7632 Iliotibial band syndrome, left leg: Secondary | ICD-10-CM | POA: Diagnosis not present

## 2022-05-16 ENCOUNTER — Other Ambulatory Visit (HOSPITAL_COMMUNITY): Payer: Self-pay

## 2022-05-16 ENCOUNTER — Encounter: Payer: Self-pay | Admitting: Family Medicine

## 2022-05-16 ENCOUNTER — Other Ambulatory Visit: Payer: Self-pay | Admitting: Family

## 2022-05-16 NOTE — Telephone Encounter (Signed)
Rx sent 

## 2022-05-16 NOTE — Telephone Encounter (Signed)
Pharmacy Patient Advocate Encounter  Insurance verification completed.    The patient is insured through [place] test claims for: Prolia 67m.  Pharmacy benefit copay: $508.54

## 2022-05-17 ENCOUNTER — Ambulatory Visit: Payer: PPO | Admitting: Rheumatology

## 2022-05-17 NOTE — Telephone Encounter (Signed)
Patient ready to schedule on or after  05/21/22  Out-of-pocket cost due at time of visit: $301   Primary: [employer] Prolia co-insurance: 20% (approximately $276) Admin fee co-insurance: 20% (approximately $25)   Secondary: n/a Prolia co-insurance:  Admin fee co-insurance:    Deductible: does not apply   Prior Auth: not required PA# Valid:    ** This summary of benefits is an estimation of the patient's out-of-pocket cost. Exact cost may vary based on individual plan coverage.

## 2022-05-18 ENCOUNTER — Telehealth: Payer: Self-pay | Admitting: Family Medicine

## 2022-05-18 NOTE — Telephone Encounter (Signed)
Message sent via MyChart.

## 2022-05-18 NOTE — Telephone Encounter (Signed)
Pt aware and voices understanding.   

## 2022-05-18 NOTE — Telephone Encounter (Signed)
Patient called to advise that she wishes to get her Prolia shot while she is in the office on 05/26/2022. She no longer wants to pick it up at [facility] and would prefer that we order it and administer it to her during her visit. Please cancel order at [facility].    Also, patient stated her anxiety is really high and she has increased her cymbalta from 90 mg to 120 mg since the weekend. She wanted to pass this information on. Offered appt with another provider but patient declines and stated she is trying to hold out until her appt on 12/7. Please call patient to advise.

## 2022-05-20 ENCOUNTER — Encounter: Payer: Self-pay | Admitting: Family Medicine

## 2022-05-23 ENCOUNTER — Encounter: Payer: Self-pay | Admitting: Internal Medicine

## 2022-05-24 NOTE — Progress Notes (Signed)
Subjective:   By signing my name below, I, Kellie Simmering, attest that this documentation has been prepared under the direction and in the presence of Mosie Lukes, MD., 05/26/2022.   Patient ID: Victoria [name], female    DOB: [birth year], 66 y.o.   MRN: [id number]  Chief Complaint  Patient presents with   Follow-up    Follow up   HPI Patient is in today for an office visit. Denies CP/ SOB/ HA/ congestion/ fevers/ GI or GU c/o.  Anxiety/Depression Patient reports today with worsening anxiety/depression. Her anxiety has been causing jitteriness and palpitations. She is currently taking Olanzapine 5 mg nightly which she says suppresses her anxiety, though not entirely, and helps her sleep. She forgot to take Olanzapine from 11/26-11/28 which caused a spike in her anxiety/depression and suicidal ideations to form. She has no active plan. She is also taking Duloxetine 120 mg and Lorazepam 1 mg daily. She had a counseling appointment yesterday, which she said was helpful, and is interested in making future appointments. She is also interested in receiving a psychiatry referral and taking Effexor 225 mg.  Osteoporosis Patient is requesting a Prolia injection today.  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 LHC 6/16:  oOM1 60, pRCA 25 >> med Rx b. cath 3/19 2nd OM with 95% stenosis s/p synergy DES & anomalous RCA   CAP (community acquired pneumonia) 2016; 06/07/2016   Chronic bronchitis (HCC)    Chronic lower back pain    Complication of anesthesia    "think I have a hard time waking up from it"   COPD (chronic obstructive pulmonary disease) ([place])    Depression    mild   Diverticulitis    Diverticulosis    GERD (gastroesophageal reflux disease)    H/O hiatal hernia    Headache    "weekly"  (08/23/2017)   History of echocardiogram    Echo 6/16:  Mod LVH, EF 60-65%, no RWMA, Gr 1 DD, trivial MR, normal LA size.   Hyperglycemia 11/20/2015   Hyperlipidemia, mixed 09/11/2007   Qualifier: Diagnosis of  By: Jerold Coombe   Did not tolerate Lipitor, zocor, Lovastatin, Pravastatin, Livalo, Crestor even low dose    IBS (irritable bowel syndrome)    Maxillary sinusitis    Normal cardiac stress test 11/2011   No evidence of ischemia or infarct.   Calculated ejection fraction 72%.   Obesity    OSA (obstructive sleep apnea) 02/2012   has stopped using  cpap   Osteoarthritis    Osteoporosis    Pneumonia 11/2011   "before 2013 I hadn't had pneumonia since I was a child" (04/13/2012)   Pulmonary nodules    S/P angioplasty with stent 08/23/17 ostial 2nd OM with DES synnergy 08/24/2017   Schatzki's ring    Past Surgical History:  Procedure Laterality Date   APPENDECTOMY  1989   CARDIAC CATHETERIZATION N/A 11/25/2014   Procedure: Right/Left Heart Cath and Coronary Angiography;  Surgeon: Belva Crome, MD;  Location: [facility];  Service: Cardiovascular;  Laterality: N/A;   [place]   COLONOSCOPY  03/2022   CORONARY ANGIOPLASTY WITH STENT PLACEMENT  08/23/2017   CORONARY STENT INTERVENTION N/A 08/23/2017   Procedure: CORONARY STENT INTERVENTION;  Surgeon:  Burnell Blanks, MD;  Location: [facility];  Service: Cardiovascular;  Laterality: N/A;   HERNIA REPAIR  04/13/2012   VHR laparoscopic   LEFT HEART CATH AND CORONARY ANGIOGRAPHY N/A 08/23/2017   Procedure: LEFT HEART CATH AND CORONARY ANGIOGRAPHY;  Surgeon: Burnell Blanks, MD;  Location: [facility];  Service: Cardiovascular;  Laterality: N/A;   VENTRAL HERNIA REPAIR  04/13/2012   Procedure: LAPAROSCOPIC VENTRAL HERNIA;  Surgeon: Adin Hector, MD;  Location: [facility];  Service: General;  Laterality: N/A;  laparoscopic repair of incarcerated hernia   Family History  Problem Relation Age  of Onset   Breast cancer Mother    Hypertension Mother    Diabetes Mother    Cancer Mother        recurrent, metastatic breast cancer.    Diverticulosis Father    Prostate cancer Father    Breast cancer Sister    Cancer Sister        breast cancer, invasive ductal carcinoma in 2022,DCIS at 27 with 4 weeks of radiation, 5 years of Tamoxifen    Pulmonary embolism Brother        recurrent   Heart attack Maternal Grandfather    Cerebral palsy Son    Cancer Niece 19   Osteoporosis Niece    Stroke Neg Hx    Colon cancer Neg Hx    Esophageal cancer Neg Hx    Stomach cancer Neg Hx    Rectal cancer Neg Hx    Social History   Socioeconomic History   Marital status: Married    Spouse name: Not on file   Number of children: 1   Years of education: 14   Highest education level: Associate degree: academic program  Occupational History   Occupation: Disabled   Tobacco Use   Smoking status: Never    Passive exposure: Never   Smokeless tobacco: Never  Vaping Use   Vaping Use: Never used  Substance and Sexual Activity   Alcohol use: Yes    Comment: social use   Drug use: No   Sexual activity: Yes    Comment: gluten free, lives with husband and son with CP quadriplegia  Other Topics Concern   Not on file  Social History Narrative   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    Lives at home with her husband and son.   Right-handed.   2 cups caffeine per day.   One story home   Social Determinants of Health   Financial Resource Strain: Low Risk  (10/19/2021)   Overall Financial Resource Strain (CARDIA)    Difficulty of Paying Living Expenses: Not hard at all  Food Insecurity: No Food Insecurity (10/19/2021)   Hunger Vital Sign    Worried About Running Out of Food in the Last Year: Never true    Ran Out of Food in the Last Year: Never true  Transportation Needs: No Transportation Needs (10/19/2021)   PRAPARE - Hydrologist (Medical): No    Lack of  Transportation (Non-Medical): No  Physical Activity: Insufficiently Active (10/19/2021)   Exercise Vital Sign    Days of Exercise per Week: 7 days    Minutes of Exercise per Session: 20 min  Stress: Stress Concern Present (10/19/2021)   [place]    Feeling of Stress : Rather much  Social Connections: Moderately Integrated (10/19/2021)   Social Connection and Isolation Panel [NHANES]  Frequency of Communication with Friends and Family: More than three times a week    Frequency of Social Gatherings with Friends and Family: Once a week    Attends Religious Services: More than 4 times per year    Active Member of [employer] or Organizations: No    Attends Archivist Meetings: Never    Marital Status: Married  Human resources officer Violence: Not At Risk (10/19/2021)   Humiliation, Afraid, Rape, and Kick questionnaire    Fear of Current or Ex-Partner: No    Emotionally Abused: No    Physically Abused: No    Sexually Abused: No   Outpatient Medications Prior to Visit  Medication Sig Dispense Refill   albuterol (VENTOLIN HFA) 108 (90 Base) MCG/ACT inhaler Inhale 1-2 puffs into the lungs every 6 (six) hours as needed. 8 g 2   aspirin 81 MG tablet Take 81 mg by mouth daily.     Benralizumab (FASENRA PEN) 30 MG/ML SOAJ Inject 1 mL (30 mg total) into the skin every 8 (eight) weeks. 1 mL 2   Bioflavonoid Products (ESTER C PO) Take 500 mg by mouth daily.     budesonide (ENTOCORT EC) 3 MG 24 hr capsule Take 3 caps a day for 4 weeks then take 2 caps daily for 4 weeks then take 1 capsule daily for 4 weeks. 222 capsule 0   Calcium-Magnesium-Vitamin D (CALCIUM MAGNESIUM PO) Take 1 tablet by mouth daily.     Coenzyme Q10 (CO Q 10 PO) Take by mouth daily.     Evolocumab (REPATHA SURECLICK) 419 MG/ML SOAJ INJECT 1 PEN INTO THE SKIN EVERY 14 DAYS (Patient taking differently: INJECT 1 PEN INTO THE SKIN EVERY 21 DAYS) 6 mL 3   famotidine (PEPCID)  20 MG tablet TAKE 1 TABLET BY MOUTH TWICE A DAY 60 tablet 2   HYDROcodone bit-homatropine (HYCODAN) 5-1.5 MG/5ML syrup Take 5 mLs by mouth every 8 (eight) hours as needed for cough. 180 mL 0   ipratropium-albuterol (DUONEB) 0.5-2.5 (3) MG/3ML SOLN Take 3 mLs by nebulization every 6 (six) hours as needed. 120 mL 5   LORazepam (ATIVAN) 1 MG tablet TAKE 1/2 TO 1 TABLET BY MOUTH 2 TIMES A DAY AS NEEDED FOR ANXIETY OR SLEEP 30 tablet 2   nitroGLYCERIN (NITROSTAT) 0.4 MG SL tablet PLACE 1 TABLET UNDER THE TONGUE EVERY 5 MINUTES AS NEEDED FOR CHEST PAIN. 25 tablet 8   OLANZapine (ZYPREXA) 5 MG tablet TAKE 1 TABLET (5 MG TOTAL) BY MOUTH AT BEDTIME. 30 tablet 1   PROLIA 60 MG/ML SOSY injection INJECT 60 MG INTO THE SKIN ONCE FOR 1 DOSE. 1 mL 0   VITAMIN D PO Take 5,000 Units by mouth daily.     DULoxetine (CYMBALTA) 30 MG capsule 2 caps po q am and 1 cap q pm 120 capsule 5   No facility-administered medications prior to visit.   Allergies  Allergen Reactions   Beclomethasone Dipropionate Hives and Other (See Comments)     weight gain   Flexeril [Cyclobenzaprine] Anxiety   Mometasone Furo-Formoterol Fum Hives and Other (See Comments)    weight gain   Sulfonamide Derivatives Hives and Rash   Statins     Myalgias, RLS   Review of Systems  Constitutional:  Negative for chills and fever.  HENT:  Negative for congestion.   Respiratory:  Negative for shortness of breath.   Cardiovascular:  Positive for palpitations. Negative for chest pain.  Gastrointestinal:  Negative for abdominal pain, blood in stool, constipation,  diarrhea, nausea and vomiting.  Genitourinary:  Negative for dysuria, frequency, hematuria and urgency.  Skin:           Neurological:  Negative for headaches.  Psychiatric/Behavioral:  Positive for depression. The patient is nervous/anxious.       Objective:    Physical Exam Constitutional:      General: She is not in acute distress.    Appearance: Normal appearance. She is  normal weight. She is not ill-appearing.  HENT:     Head: Normocephalic and atraumatic.     Right Ear: External ear normal.     Left Ear: External ear normal.     Nose: Nose normal.     Mouth/Throat:     Mouth: Mucous membranes are moist.     Pharynx: Oropharynx is clear.  Eyes:     General:        Right eye: No discharge.        Left eye: No discharge.     Extraocular Movements: Extraocular movements intact.     Conjunctiva/sclera: Conjunctivae normal.     Pupils: Pupils are equal, round, and reactive to light.  Cardiovascular:     Rate and Rhythm: Normal rate and regular rhythm.     Pulses: Normal pulses.     Heart sounds: Normal heart sounds. No murmur heard.    No gallop.  Pulmonary:     Effort: Pulmonary effort is normal. No respiratory distress.     Breath sounds: Normal breath sounds. No wheezing or rales.  Abdominal:     General: Bowel sounds are normal.     Palpations: Abdomen is soft.     Tenderness: There is no abdominal tenderness. There is no guarding.  Musculoskeletal:        General: Normal range of motion.     Cervical back: Normal range of motion.     Right lower leg: No edema.     Left lower leg: No edema.  Skin:    General: Skin is warm and dry.  Neurological:     Mental Status: She is alert and oriented to person, place, and time.  Psychiatric:        Mood and Affect: Mood normal.        Behavior: Behavior normal.        Judgment: Judgment normal.    BP 116/72 (BP Location: Right Arm, Patient Position: Sitting, Cuff Size: Normal)   Pulse 87   Temp 97.8 F (36.6 C) (Oral)   Resp 16   Ht 5' 1" (1.549 m)   Wt 156 lb (70.8 kg)   SpO2 99%   BMI 29.48 kg/m  Wt Readings from Last 3 Encounters:  05/26/22 156 lb (70.8 kg)  04/20/22 154 lb (69.9 kg)  04/07/22 153 lb (69.4 kg)   Diabetic Foot Exam - Simple   No data filed    Lab Results  Component Value Date   WBC 8.7 05/26/2022   HGB 13.5 05/26/2022   HCT 41.0 05/26/2022   PLT 304.0  05/26/2022   GLUCOSE 80 05/26/2022   CHOL 208 (H) 05/26/2022   TRIG 113.0 05/26/2022   HDL 74.00 05/26/2022   LDLDIRECT 177.8 09/07/2011   LDLCALC 111 (H) 05/26/2022   ALT 17 05/26/2022   AST 16 05/26/2022   NA 138 05/26/2022   K 4.0 05/26/2022   CL 100 05/26/2022   CREATININE 0.63 05/26/2022   BUN 18 05/26/2022   CO2 30 05/26/2022   TSH 1.43 05/26/2022   INR   1.0 08/21/2017   HGBA1C 5.6 05/26/2022   Lab Results  Component Value Date   TSH 1.43 05/26/2022   Lab Results  Component Value Date   WBC 8.7 05/26/2022   HGB 13.5 05/26/2022   HCT 41.0 05/26/2022   MCV 91.8 05/26/2022   PLT 304.0 05/26/2022   Lab Results  Component Value Date   NA 138 05/26/2022   K 4.0 05/26/2022   CO2 30 05/26/2022   GLUCOSE 80 05/26/2022   BUN 18 05/26/2022   CREATININE 0.63 05/26/2022   BILITOT 0.3 05/26/2022   ALKPHOS 56 05/26/2022   AST 16 05/26/2022   ALT 17 05/26/2022   PROT 6.5 05/26/2022   ALBUMIN 4.4 05/26/2022   CALCIUM 9.3 05/26/2022   ANIONGAP 6 08/24/2017   EGFR 101 09/23/2021   GFR 93.33 05/26/2022   Lab Results  Component Value Date   CHOL 208 (H) 05/26/2022   Lab Results  Component Value Date   HDL 74.00 05/26/2022   Lab Results  Component Value Date   LDLCALC 111 (H) 05/26/2022   Lab Results  Component Value Date   TRIG 113.0 05/26/2022   Lab Results  Component Value Date   CHOLHDL 3 05/26/2022   Lab Results  Component Value Date   HGBA1C 5.6 05/26/2022      Assessment & Plan:   Anxiety/Depression A psychiatry referral has been made and Effexor 225 mg has been prescribed today.  Labs Blood work today will check free T3, free T4, and vitamin D along with routine markers.   Osteoporosis Patient will receive a Prolia injection today.  Problem List Items Addressed This Visit     OSA (obstructive sleep apnea) (Chronic)    Not on CPAP      Hyperglycemia (Chronic)    hgba1c acceptable, minimize simple carbs. Increase exercise as  tolerated. Consider Prevnar 20, Arexvy and covid booster      Relevant Orders   Comprehensive metabolic panel (Completed)   HgB A1c (Completed)   Hyperlipidemia, mixed    Encourage heart healthy diet such as MIND or DASH diet, increase exercise, avoid trans fats, simple carbohydrates and processed foods, consider a krill or fish or flaxseed oil cap daily.       Relevant Orders   Lipid panel (Completed)   Gastroesophageal reflux disease with hiatal hernia    Avoid offending foods, start probiotics. Do not eat large meals in late evening and consider raising head of bed.        h/o PALPITATIONS   Relevant Orders   CBC with Differential/Platelet (Completed)   TSH (Completed)   T4, free (Completed)   T3, free (Completed)   Chronic diastolic CHF (congestive heart failure), NYHA class 2 ([place]) - Primary    No recent exacerbation, minimize sodium and monitor      Anxiety and depression    Not doing well on Cymbalta. Will switch back to Effexor at 225 mg daily, continue counseling she has started and referred to psychiatry. She notes she has recently been given a steroid taper and questions if that may be playing a role. This is possible and she has titrated down so will have to monitor      Relevant Medications   venlafaxine XR (EFFEXOR XR) 75 MG 24 hr capsule   Other Relevant Orders   Ambulatory referral to Psychiatry   Osteoporosis    Encouraged to get adequate exercise, calcium and vitamin d intake check Vitamin D today      Relevant Orders  VITAMIN D 25 Hydroxy (Vit-D Deficiency, Fractures) (Completed)   Statin myopathy    Does not tolerate statins due to myalgias      Meds ordered this encounter  Medications   venlafaxine XR (EFFEXOR XR) 75 MG 24 hr capsule    Sig: Take 3 capsules (225 mg total) by mouth daily with breakfast.    Dispense:  90 capsule    Refill:  3   denosumab (PROLIA) injection 60 mg    Order Specific Question:   Patient is enrolled in [facility] program  for this medication and I have provided a copy of the Prolia Medication Guide and Patient Brochure.    Answer:   No    Order Specific Question:   I have reviewed with the patient the information in the Prolia Medication Guide and Patient Counseling Chart including the serious risks of Prolia and symptoms of each risk.    Answer:   Yes    Order Specific Question:   I have advised the patient to seek medical attention if they have signs or symptoms of any of the serious risks.    Answer:   Yes   I, [name], MD, personally preformed the services described in this documentation.  All medical record entries made by the scribe were at my direction and in my presence.  I have reviewed the chart and discharge instructions (if applicable) and agree that the record reflects my personal performance and is accurate and complete. 05/26/2022  I,Mohammed Iqbal,acting as a scribe for [name], MD.,have documented all relevant documentation on the behalf of [name], MD,as directed by  [name], MD while in the presence of [name], MD.  [name], MD

## 2022-05-25 DIAGNOSIS — M6281 Muscle weakness (generalized): Secondary | ICD-10-CM | POA: Diagnosis not present

## 2022-05-25 DIAGNOSIS — M7632 Iliotibial band syndrome, left leg: Secondary | ICD-10-CM | POA: Diagnosis not present

## 2022-05-25 NOTE — Assessment & Plan Note (Signed)
Avoid offending foods, start probiotics. Do not eat large meals in late evening and consider raising head of bed.  

## 2022-05-25 NOTE — Assessment & Plan Note (Signed)
Does not tolerate statins due to myalgias

## 2022-05-25 NOTE — Assessment & Plan Note (Signed)
No recent exacerbation, minimize sodium and monitor

## 2022-05-25 NOTE — Assessment & Plan Note (Signed)
Encourage heart healthy diet such as MIND or DASH diet, increase exercise, avoid trans fats, simple carbohydrates and processed foods, consider a krill or fish or flaxseed oil cap daily.  °

## 2022-05-25 NOTE — Assessment & Plan Note (Signed)
Encouraged to get adequate exercise, calcium and vitamin d intake check Vitamin D today

## 2022-05-25 NOTE — Assessment & Plan Note (Addendum)
hgba1c acceptable, minimize simple carbs. Increase exercise as tolerated. Consider Prevnar 20, Arexvy and covid booster

## 2022-05-26 ENCOUNTER — Ambulatory Visit (INDEPENDENT_AMBULATORY_CARE_PROVIDER_SITE_OTHER): Payer: PPO | Admitting: Family Medicine

## 2022-05-26 ENCOUNTER — Encounter: Payer: Self-pay | Admitting: Family Medicine

## 2022-05-26 VITALS — BP 116/72 | HR 87 | Temp 97.8°F | Resp 16 | Ht 61.0 in | Wt 156.0 lb

## 2022-05-26 DIAGNOSIS — K219 Gastro-esophageal reflux disease without esophagitis: Secondary | ICD-10-CM

## 2022-05-26 DIAGNOSIS — G720 Drug-induced myopathy: Secondary | ICD-10-CM | POA: Diagnosis not present

## 2022-05-26 DIAGNOSIS — E782 Mixed hyperlipidemia: Secondary | ICD-10-CM | POA: Diagnosis not present

## 2022-05-26 DIAGNOSIS — K449 Diaphragmatic hernia without obstruction or gangrene: Secondary | ICD-10-CM

## 2022-05-26 DIAGNOSIS — R002 Palpitations: Secondary | ICD-10-CM | POA: Diagnosis not present

## 2022-05-26 DIAGNOSIS — R739 Hyperglycemia, unspecified: Secondary | ICD-10-CM | POA: Diagnosis not present

## 2022-05-26 DIAGNOSIS — F32A Depression, unspecified: Secondary | ICD-10-CM | POA: Diagnosis not present

## 2022-05-26 DIAGNOSIS — G4733 Obstructive sleep apnea (adult) (pediatric): Secondary | ICD-10-CM

## 2022-05-26 DIAGNOSIS — T466X5A Adverse effect of antihyperlipidemic and antiarteriosclerotic drugs, initial encounter: Secondary | ICD-10-CM

## 2022-05-26 DIAGNOSIS — F419 Anxiety disorder, unspecified: Secondary | ICD-10-CM | POA: Diagnosis not present

## 2022-05-26 DIAGNOSIS — M810 Age-related osteoporosis without current pathological fracture: Secondary | ICD-10-CM | POA: Diagnosis not present

## 2022-05-26 DIAGNOSIS — I5032 Chronic diastolic (congestive) heart failure: Secondary | ICD-10-CM

## 2022-05-26 LAB — VITAMIN D 25 HYDROXY (VIT D DEFICIENCY, FRACTURES): VITD: 70.84 ng/mL (ref 30.00–100.00)

## 2022-05-26 LAB — CBC WITH DIFFERENTIAL/PLATELET
Basophils Absolute: 0 10*3/uL (ref 0.0–0.1)
Basophils Relative: 0 % (ref 0.0–3.0)
Eosinophils Absolute: 0 10*3/uL (ref 0.0–0.7)
Eosinophils Relative: 0 % (ref 0.0–5.0)
HCT: 41 % (ref 36.0–46.0)
Hemoglobin: 13.5 g/dL (ref 12.0–15.0)
Lymphocytes Relative: 11.4 % — ABNORMAL LOW (ref 12.0–46.0)
Lymphs Abs: 1 10*3/uL (ref 0.7–4.0)
MCHC: 32.9 g/dL (ref 30.0–36.0)
MCV: 91.8 fl (ref 78.0–100.0)
Monocytes Absolute: 0.5 10*3/uL (ref 0.1–1.0)
Monocytes Relative: 6.2 % (ref 3.0–12.0)
Neutro Abs: 7.1 10*3/uL (ref 1.4–7.7)
Neutrophils Relative %: 82.4 % — ABNORMAL HIGH (ref 43.0–77.0)
Platelets: 304 10*3/uL (ref 150.0–400.0)
RBC: 4.47 Mil/uL (ref 3.87–5.11)
RDW: 14.1 % (ref 11.5–15.5)
WBC: 8.7 10*3/uL (ref 4.0–10.5)

## 2022-05-26 LAB — LIPID PANEL
Cholesterol: 208 mg/dL — ABNORMAL HIGH (ref 0–200)
HDL: 74 mg/dL (ref >39.00)
LDL Cholesterol: 111 mg/dL — ABNORMAL HIGH (ref 0–99)
NonHDL: 133.68
Total CHOL/HDL Ratio: 3
Triglycerides: 113 mg/dL (ref 0.0–149.0)
VLDL: 22.6 mg/dL (ref 0.0–40.0)

## 2022-05-26 LAB — COMPREHENSIVE METABOLIC PANEL
ALT: 17 U/L (ref 0–35)
AST: 16 U/L (ref 0–37)
Albumin: 4.4 g/dL (ref 3.5–5.2)
Alkaline Phosphatase: 56 U/L (ref 39–117)
BUN: 18 mg/dL (ref 6–23)
CO2: 30 mEq/L (ref 19–32)
Calcium: 9.3 mg/dL (ref 8.4–10.5)
Chloride: 100 mEq/L (ref 96–112)
Creatinine, Ser: 0.63 mg/dL (ref 0.40–1.20)
GFR: 93.33 mL/min (ref >60.00)
Glucose, Bld: 80 mg/dL (ref 70–99)
Potassium: 4 mEq/L (ref 3.5–5.1)
Sodium: 138 mEq/L (ref 135–145)
Total Bilirubin: 0.3 mg/dL (ref 0.2–1.2)
Total Protein: 6.5 g/dL (ref 6.0–8.3)

## 2022-05-26 LAB — HEMOGLOBIN A1C: Hgb A1c MFr Bld: 5.6 % (ref 4.6–6.5)

## 2022-05-26 LAB — TSH: TSH: 1.43 u[IU]/mL (ref 0.35–5.50)

## 2022-05-26 LAB — T4, FREE: Free T4: 0.82 ng/dL (ref 0.60–1.60)

## 2022-05-26 LAB — T3, FREE: T3, Free: 3.4 pg/mL (ref 2.3–4.2)

## 2022-05-26 MED ORDER — VENLAFAXINE HCL ER 75 MG PO CP24: 225.0000 mg | ORAL_CAPSULE | Freq: Every day | ORAL | 3 refills | Status: DC

## 2022-05-26 MED ORDER — DENOSUMAB 60 MG/ML ~~LOC~~ SOSY
60.0000 mg | PREFILLED_SYRINGE | Freq: Once | SUBCUTANEOUS | Status: AC
  Administered 2022-05-26: 60 mg via SUBCUTANEOUS

## 2022-05-26 NOTE — Patient Instructions (Signed)
[facility] [phone]  Generalized Anxiety Disorder, Adult Generalized anxiety disorder (GAD) is a mental health condition. Unlike normal worries, anxiety related to GAD is not triggered by a specific event. These worries do not fade or get better with time. GAD interferes with relationships, work, and school. GAD symptoms can vary from mild to severe. People with severe GAD can have intense waves of anxiety with physical symptoms that are similar to panic attacks. What are the causes? The exact cause of GAD is not known, but the following are believed to have an impact: Differences in natural brain chemicals. Genes passed down from parents to children. Differences in the way threats are perceived. Development and stress during childhood. Personality. What increases the risk? The following factors may make you more likely to develop this condition: Being female. Having a family history of anxiety disorders. Being very shy. Experiencing very stressful life events, such as the death of a loved one. Having a very stressful family environment. What are the signs or symptoms? People with GAD often worry excessively about many things in their lives, such as their health and family. Symptoms may also include: Mental and emotional symptoms: Worrying excessively about natural disasters. Fear of being late. Difficulty concentrating. Fears that others are judging your performance. Physical symptoms: Fatigue. Headaches, muscle tension, muscle twitches, trembling, or feeling shaky. Feeling like your heart is pounding or beating very fast. Feeling out of breath or like you cannot take a deep breath. Having trouble falling asleep or staying asleep, or experiencing restlessness. Sweating. Nausea, diarrhea, or irritable bowel syndrome (IBS). Behavioral symptoms: Experiencing erratic moods or irritability. Avoidance of new situations. Avoidance of  people. Extreme difficulty making decisions. How is this diagnosed? This condition is diagnosed based on your symptoms and medical history. You will also have a physical exam. Your health care provider may perform tests to rule out other possible causes of your symptoms. To be diagnosed with GAD, a person must have anxiety that: Is out of his or her control. Affects several different aspects of his or her life, such as work and relationships. Causes distress that makes him or her unable to take part in normal activities. Includes at least three symptoms of GAD, such as restlessness, fatigue, trouble concentrating, irritability, muscle tension, or sleep problems. Before your health care provider can confirm a diagnosis of GAD, these symptoms must be present more days than they are not, and they must last for 6 months or longer. How is this treated? This condition may be treated with: Medicine. Antidepressant medicine is usually prescribed for long-term daily control. Anti-anxiety medicines may be added in severe cases, especially when panic attacks occur. Talk therapy (psychotherapy). Certain types of talk therapy can be helpful in treating GAD by providing support, education, and guidance. Options include: Cognitive behavioral therapy (CBT). People learn coping skills and self-calming techniques to ease their physical symptoms. They learn to identify unrealistic thoughts and behaviors and to replace them with more appropriate thoughts and behaviors. Acceptance and commitment therapy (ACT). This treatment teaches people how to be mindful as a way to cope with unwanted thoughts and feelings. Biofeedback. This process trains you to manage your body's response (physiological response) through breathing techniques and relaxation methods. You will work with a therapist while machines are used to monitor your physical symptoms. Stress management techniques. These include yoga, meditation, and exercise. A  mental health specialist can help determine which treatment is best for you. Some people see improvement with  one type of therapy. However, other people require a combination of therapies. Follow these instructions at home: Lifestyle Maintain a consistent routine and schedule. Anticipate stressful situations. Create a plan and allow extra time to work with your plan. Practice stress management or self-calming techniques that you have learned from your therapist or your health care provider. Exercise regularly and spend time outdoors. Eat a healthy diet that includes plenty of vegetables, fruits, whole grains, low-fat dairy products, and lean protein. Do not eat a lot of foods that are high in fat, added sugar, or salt (sodium). Drink plenty of water. Avoid alcohol. Alcohol can increase anxiety. Avoid caffeine and certain over-the-counter cold medicines. These may make you feel worse. Ask your pharmacist which medicines to avoid. General instructions Take over-the-counter and prescription medicines only as told by your health care provider. Understand that you are likely to have setbacks. Accept this and be kind to yourself as you persist to take better care of yourself. Anticipate stressful situations. Create a plan and allow extra time to work with your plan. Recognize and accept your accomplishments, even if you judge them as small. Spend time with people who care about you. Keep all follow-up visits. This is important. Where to find more information [place]: https://carter.com/ Substance Abuse and Mental Health Services: ktimeonline.com Contact a health care provider if: Your symptoms do not get better. Your symptoms get worse. You have signs of depression, such as: A persistently sad or irritable mood. Loss of enjoyment in activities that used to bring you joy. Change in weight or eating. Changes in sleeping habits. Get help right away if: You have thoughts  about hurting yourself or others. If you ever feel like you may hurt yourself or others, or have thoughts about taking your own life, get help right away. Go to your nearest emergency department or: Call your local emergency services (911 in the [place] Call a suicide crisis helpline, such as the [place] at 2483363612 or 988 in the [place]. This is open 24 hours a day in the [place] Text the Crisis Text Line at (226)020-4957 (in the [place].). Summary Generalized anxiety disorder (GAD) is a mental health condition that involves worry that is not triggered by a specific event. People with GAD often worry excessively about many things in their lives, such as their health and family. GAD may cause symptoms such as restlessness, trouble concentrating, sleep problems, frequent sweating, nausea, diarrhea, headaches, and trembling or muscle twitching. A mental health specialist can help determine which treatment is best for you. Some people see improvement with one type of therapy. However, other people require a combination of therapies. This information is not intended to replace advice given to you by your health care provider. Make sure you discuss any questions you have with your health care provider. Document Revised: 12/30/2020 Document Reviewed: 09/27/2020 Elsevier Patient Education  [place].

## 2022-05-28 NOTE — Assessment & Plan Note (Signed)
Not doing well on Cymbalta. Will switch back to Effexor at 225 mg daily, continue counseling she has started and referred to psychiatry. She notes she has recently been given a steroid taper and questions if that may be playing a role. This is possible and she has titrated down so will have to monitor

## 2022-06-07 ENCOUNTER — Telehealth: Payer: Self-pay | Admitting: Internal Medicine

## 2022-06-07 ENCOUNTER — Telehealth (INDEPENDENT_AMBULATORY_CARE_PROVIDER_SITE_OTHER): Payer: PPO | Admitting: Family Medicine

## 2022-06-07 VITALS — HR 107

## 2022-06-07 DIAGNOSIS — J4541 Moderate persistent asthma with (acute) exacerbation: Secondary | ICD-10-CM | POA: Diagnosis not present

## 2022-06-07 MED ORDER — PREDNISONE 10 MG (21) PO TBPK: ORAL_TABLET | ORAL | 0 refills | Status: DC

## 2022-06-07 NOTE — Patient Instructions (Signed)
Please be sure to drink plenty of fluids.   Use prednisone as well as other rescue medications.   You may take the following OTC medications to help with symptoms:  For cough, use  cough syrups or other cough suppressants.  For headache, sore throat, fevers, muscle aches, chills, other pain, take ibuprofen or tylenol  For congestion, use nasal sprays, decongestants, or antihistamines  Please follow up if no improvement.   Go to ED if you have severe chest pain, fevers, shortness of breath or other worrisome symptoms.

## 2022-06-07 NOTE — Progress Notes (Signed)
Virtual Visit via Video Note  I connected with Victoria [name] on 06/12/22 at  1:40 PM EST by a video enabled telemedicine application and verified that I am speaking with the correct person using two identifiers.  Location: Patient: Home Provider: Office   I discussed the limitations of evaluation and management by telemedicine and the availability of in person appointments. The patient expressed understanding and agreed to proceed.  History of Present Illness: She presents with chief complaint of Cough (Cough x1 week ago got better over the weekend, but now cough has gotten worse, fatigue, yellow/beige mucus, chills. ) .   Asthma Exacerbation: She has previously been evaluated here for asthma and now presents with an asthma exacerbation. This exacerbation began 1 week ago.  Associated symptoms include chills, dyspnea, productive cough, and wheezing.  Suspected precipitants include infection.  Symptoms have been gradually worsening since their onset.   She has treated this current exacerbation with anticholinergics, immunotherapy, short-acting inhaled beta-adrenergic agonists, and hycodan. The patient reports adherence to this regimen    Observations/Objective: Vitals:   06/07/22 1351  Pulse: (!) 107  SpO2: 95%   Gen: NAD, resting comfortably HEENT: EOMI Pulm: NWOB Skin: no rash on face Neuro: no facial asymmetry or dysmetria Psych: Normal affect  Assessment and Plan:  Patient symptoms consistent with viral exacerbation of asthma.  Patient already has history of severe asthma extensive medication regimen as listed in HPI.  She is following this diligently and still has ongoing symptoms.  Given this I recommend starting prednisone course and have patient follow-up in clinic for evaluation.  If no improvement with this or symptoms worsen rapidly, recommend patient go to emergency department Follow Up Instructions:    I discussed the assessment and treatment plan with the patient. The  patient was provided an opportunity to ask questions and all were answered. The patient agreed with the plan and demonstrated an understanding of the instructions.   The patient was advised to call back or seek an in-person evaluation if the symptoms worsen or if the condition fails to improve as anticipated.  I provided 20 minutes of non-face-to-face time during this encounter.   Bonnita Hollow, MD

## 2022-06-08 ENCOUNTER — Telehealth: Payer: Self-pay | Admitting: Internal Medicine

## 2022-06-08 NOTE — Telephone Encounter (Signed)
Patient is calling wishing to speak with the nurse wanting to do her appt virtually tomorrow due toher being sick. Please advise

## 2022-06-08 NOTE — Telephone Encounter (Signed)
Pt aware, appt type changed and Dottie aware.

## 2022-06-08 NOTE — Telephone Encounter (Signed)
Called and spoke to patient and she states that on Monday she started feeling really bad. Productive cough with yellow to green thick mucs. Congestion noted. She states that her son went to a doctor appt yesterday and tested positive for Flu type A. She was seen by her PCP virtually yesterday and was placed on a 6 day taper of Prednisone. She has not tested herself for flu. No fever noted today.   Sir would you like to add any medications to the prednisone taper.   Please advise

## 2022-06-08 NOTE — Telephone Encounter (Signed)
See note below and advise if ok for virtual visit.

## 2022-06-08 NOTE — Telephone Encounter (Signed)
Yes, we can do a video visit with her tomorrow

## 2022-06-09 ENCOUNTER — Telehealth (INDEPENDENT_AMBULATORY_CARE_PROVIDER_SITE_OTHER): Payer: PPO | Admitting: Internal Medicine

## 2022-06-09 ENCOUNTER — Encounter: Payer: Self-pay | Admitting: Internal Medicine

## 2022-06-09 DIAGNOSIS — K52832 Lymphocytic colitis: Secondary | ICD-10-CM | POA: Diagnosis not present

## 2022-06-09 DIAGNOSIS — F4323 Adjustment disorder with mixed anxiety and depressed mood: Secondary | ICD-10-CM | POA: Diagnosis not present

## 2022-06-09 MED ORDER — OSELTAMIVIR PHOSPHATE 75 MG PO CAPS: 75.0000 mg | ORAL_CAPSULE | Freq: Two times a day (BID) | ORAL | 0 refills | Status: DC

## 2022-06-09 NOTE — Progress Notes (Signed)
Subjective:    Patient ID: Victoria [name], female    DOB: June 17, 1956, 66 y.o.   MRN: [id number]  This service was provided via telemedicine with audio/visual communication. The patient was located at home The provider was located in provider's GI office. The patient did consent to this telephone visit and is aware of possible charges through their insurance for this visit.   The persons participating in this telemedicine service were the patient and I. Time spent on call: 22 min   HPI Victoria [name] is a 66 year old female with a history of GERD with hiatal hernia, Schatzki's ring with dilation in 2016, colonic diverticulosis with history of diverticulitis, history of pulmonary aspergillosis with eosinophilic asthma and bronchiectasis, recent diagnosis of lymphocytic colitis who seen for follow-up via video visit.  She reports that her diarrhea is much better and has completely resolved.  She is now on budesonide 3 mg daily and this is her fourth and final week of the lowest dose.  Bowel movements have returned to normal.  She is not having abdominal pain.    Unfortunately she has had worsening in her depression and anxiety and this relates to ongoing respiratory issues for both her and her son Victoria [name].  [name] had COVID in late August.  Also for the past week she has had cough, congestion and low-grade fever.  She was found to have influenza A about a week ago.  Several days after that her son Victoria [name] developed the same symptoms plus he has now had a fever for about a week to 104 F.  His father is taking him right now for a chest x-ray.  [name] has been started on prednisone for her respiratory issues, took 40 mg for 3 days, today is day 2 of 30 mg and she has 1 day of 20 mg in 1 day of 10 mg plan.  She has had an exacerbation of both her anxiety and depression.  She was changed from Effexor to Cymbalta in spring to see if this would help with her joint pains.  Because of worsening issues and anxiety and  depression she was changed back to Effexor and the dose was increased to 225 mg daily.  Also in and around September Zyprexa was added at night.  She does report on her worst day recently having suicidal thoughts but without a plan.  She reached out to the [facility] and was able to establish telephone contact which helped her tremendously.  She has follow-up planned with them as well.   Review of Systems As per HPI, otherwise negative  Current Medications, Allergies, Past Medical History, Past Surgical History, Family History and Social History were reviewed in [employer] record.    Objective:   Physical Exam There were no vitals taken for this visit. Gen: Awake and alert by video visit, mood is somewhat depressed today       Assessment & Plan:  66 year old female with a history of GERD with hiatal hernia, Schatzki's ring with dilation in 2016, colonic diverticulosis with history of diverticulitis, history of pulmonary aspergillosis with eosinophilic asthma and bronchiectasis, recent diagnosis of lymphocytic colitis who seen for follow-up via video visit.  Lymphocytic colitis --diagnosis by colonoscopy in October 2023; complete response to budesonide.  She only has about a week of budesonide left that she is also on prednisone for respiratory issue.  In the setting of her worsening anxiety and depression I am going to stop budesonide altogether at this time --  Stop budesonide -- She will complete prednisone taper over the next 3 days. -- I asked her to notify me should her diarrhea returned after stopping steroids; if this is the case we would likely return to the lowest effective dose of 3 mg budesonide daily  2.  Influenza A --she is starting on Tamiflu under the direction of pulmonology today  3.  Anxiety and depression with suicidal ideation --she openly admitted to previous suicidal ideation though this has improved.  She did reach out and received  direct contact with [facility] therapy.  She will follow-up with them.  She is also been referred to [facility] which I think is a great idea.  I am going to touch base with Dr. Charlett Blake, her PCP as well.  I also gave her the number for the National suicide hotline and she wrote this down. -- For now she is continuing Effexor 225 mg daily and Zyprexa 5 mg at bedtime -- I will contact primary care to see if the [facility] appointment can be prioritized -- Continue close contact with primary care and her therapist -- Provided with the National suicide hotline their telephone number is [phone]  30 minutes total spent today including patient facing time, coordination of care, reviewing medical history/procedures/pertinent radiology studies, and documentation of the encounter.

## 2022-06-09 NOTE — Telephone Encounter (Signed)
Called and spoke to paitent and advised her the MR is wanting to send in Tamiflu for her. She verbalized understanding. Verified pharmacy. Nothing further needed

## 2022-06-09 NOTE — Telephone Encounter (Signed)
  She got admitted for bad flu in past from what I Remember  Plan  - I Think given sick exposure and high odds what she has is flu she should take - tamiflu 101m bid     Latest Reference Range & Units 12/15/06 09:06 09/21/07 09:01 05/12/08 15:48 06/27/08 13:58 08/16/08 20:14 08/19/08 00:00 10/21/08 13:08 12/03/09 00:00 06/16/10 11:48 09/20/10 10:53 06/29/11 13:40 06/29/11 21:53 06/30/11 06:05 07/02/11 05:45 08/25/11 14:41 12/13/11 17:30 12/15/11 05:00 04/04/12 11:53 04/14/12 06:20 09/04/12 14:25 11/02/12 14:23 07/27/13 13:59 09/03/14 15:21 11/08/14 14:30 11/08/14 19:30 11/09/14 06:37 11/09/14 21:00 11/10/14 03:02 11/11/14 04:35 11/17/14 14:40 11/17/14 14:51 11/19/14 12:40 11/21/14 14:25 02/25/15 13:00 05/12/15 13:56 11/20/15 12:31 06/06/16 13:40 06/07/16 11:05 06/09/16 04:58 06/10/16 03:38 04/12/17 08:05 08/21/17 14:12 08/24/17 03:49 11/14/17 17:21 04/02/18 15:16 05/22/18 17:16 10/30/18 11:23 03/11/19 08:24 05/29/19 12:22 01/14/20 10:40 04/02/20 12:21 06/30/20 14:20 09/29/20 11:38 03/04/21 11:11 04/07/21 11:57 07/06/21 14:35 09/23/21 13:21 05/26/22 10:24  Creatinine 0.40 - 1.20 mg/dL 0.7 0.7 0.7 0.7 1.0 0.7 0.9 0.8 0.8 0.7 0.66 0.72 0.68 0.62 0.9 0.70 0.72 0.65 0.56 0.6 0.7 0.67 0.72 0.56 0.63 0.46 0.73 0.57 0.54 0.65 0.70 0.64 0.63 0.64 0.68 0.71 0.68 0.67 0.61 0.63 0.63 0.62 0.67 0.65 0.62 0.62 0.62 0.55 0.62 0.60 0.65 0.64 0.61 0.62 0.60 0.58 0.57 0.63     Allergies  Allergen Reactions   Beclomethasone Dipropionate Hives and Other (See Comments)     weight gain   Flexeril [Cyclobenzaprine] Anxiety   Mometasone Furo-Formoterol Fum Hives and Other (See Comments)    weight gain   Sulfonamide Derivatives Hives and Rash   Statins     Myalgias, RLS

## 2022-06-20 ENCOUNTER — Encounter: Payer: Self-pay | Admitting: Family Medicine

## 2022-06-29 ENCOUNTER — Other Ambulatory Visit: Payer: Self-pay | Admitting: Family Medicine

## 2022-06-30 ENCOUNTER — Ambulatory Visit (HOSPITAL_BASED_OUTPATIENT_CLINIC_OR_DEPARTMENT_OTHER): Payer: PPO | Admitting: Psychiatry

## 2022-06-30 ENCOUNTER — Encounter (HOSPITAL_COMMUNITY): Payer: Self-pay | Admitting: Psychiatry

## 2022-06-30 DIAGNOSIS — F419 Anxiety disorder, unspecified: Secondary | ICD-10-CM | POA: Diagnosis not present

## 2022-06-30 DIAGNOSIS — G4733 Obstructive sleep apnea (adult) (pediatric): Secondary | ICD-10-CM

## 2022-06-30 DIAGNOSIS — F32A Depression, unspecified: Secondary | ICD-10-CM | POA: Diagnosis not present

## 2022-06-30 MED ORDER — MIRTAZAPINE 15 MG PO TBDP: 7.5000 mg | ORAL_TABLET | Freq: Every day | ORAL | 1 refills | Status: DC

## 2022-06-30 MED ORDER — CLONAZEPAM 0.5 MG PO TABS: 0.5000 mg | ORAL_TABLET | Freq: Every day | ORAL | 0 refills | Status: DC | PRN

## 2022-06-30 NOTE — Progress Notes (Signed)
Psychiatric Initial Adult Assessment   Patient Identification: Victoria [name] MRN:  [id number] Date of Evaluation:  06/30/2022 Referral Source: PCP Chief Complaint:   Chief Complaint  Patient presents with   Establish Care   Anxiety   Depression   Visit Diagnosis:    ICD-10-CM   1. Anxiety and depression  F41.9 clonazePAM (KLONOPIN) 0.5 MG tablet   F32.A mirtazapine (REMERON SOL-TAB) 15 MG disintegrating tablet    2. OSA (obstructive sleep apnea)  G47.33        Assessment:  Victoria [name] is a 67 y.o. female with a history of anxiety, depression, OSA who presents virtually to [place] [facility] for initial evaluation on 06/30/2022.  Patient reports symptoms of anxiety including feeling nervous or on edge, being unable to stop controlled worrying, worrying too much about different things, and fear that something awful would happen.  Patient's symptoms have been gradually progressing to the point that she is feeling well and unsure where she continued to manage this.  There was a period in November 2023 where she was having passive SI without intent or plan.  Patient also did endorse low mood, poor sleep, difficulty concentrating.  She does have a history of obstructive sleep apnea and does not use CPAP with last sleep study being over 10 years ago. Psychosocially patient is dealing with increased stress of caring for her adult son with cerebral palsy and planning the next steps for him after she and her husband pass or are no longer able to care for him.  Patient meets criteria for MDD and generalized anxiety disorder. Of note on exam patient was found to have smacking of her lips repetitive movements of her mouth which are concerning for parkinsonism secondary to Zyprexa.  Patient would benefit from medication adjustments and repeat sleep study.  A number of assessments were performed during the evaluation today including PHQ-9 which they scored a 4 on, GAD-7  which they scored a 11 on, and [place] suicide severity screening which showed no risk.  Based on these assessments patient would benefit from medication adjustment to better target their symptoms.  Plan: - Decrease Olanzapine 2.5 mg QHS - Continue Effexor XR 150 mg QD - Start mirtazapine 7.5 mg QHS - Discontinue Ativan 0.5  - Start Klonopin 0.5 mg QD prn for anxiety with plan to taper in future - Recommend repeat sleep study, could consider dental devices OSA still present - CMP, CBC, lipid profile, Vit D, A1c, TSH reviewed - Continue with therapy every other week through [place] therapy - Crisis resources reviewed - Follow up in a month  History of Present Illness: Victoria [name] presents reporting that the last few months she has been struggling with an increase in anxiety and a lot of fear.  She notes that she has been feeling overly worried and overwhelmed, often nervous and scared about the future.  Patient notes that she feels that she is at the end of her rope and there have been a few days around a month ago she had passed the SI.  Patient notes a lot of stressors also around caring for her son who has cerebral palsy.  He notes that for the last month now she and her husband have been the sole caretaker as they have not had anyone assisting them.  She feels like he needs to be placed in long-term housing at this point however there is no availability for anything appropriate in the area.  Patient spends a lot  of time regretting moving here from [place], [place] as she knows there is a group housing for him there.  At this point patient does not feel it is feasible however due to financial reasons and due to the logistics and work it would take.  Patient notes that she first started to experience anxiety around 33 at which time she had a lot of stress in her life in part due to the birth of her son who struggled with cerebral Palsy.  She notes that she has been trialed on several antidepressants  without success before finding benefit from venlafaxine.  After starting venlafaxine she felt like she stabilized felt good for a number of years.  The family moved to [facility] in 2004 in large part for her son as there was a school/program here at that would be great for him, and also because patient has been experiencing worsening medical symptoms due to the cold of [place].  In June 2020 patient started to develop breakthrough anxiety and insomnia while on the venlafaxine at which point she was started on Ativan 0.5 mg at bedtime to help with sleep.  Patient found relief from this and thinks that it helped still with thoughts in addition to take away the focus on her joint pain.  Over time the dose was increased to 1 mg at that time.  Patient's anxiety symptoms however continue to increase and in April 2023 she asked to switch over to Cymbalta for better symptom management, improvement in sleep symptoms, and for the benefit of pain management.  In August 2023 she had been undergoing treatment for COVID and having a period of extreme anxiety.  She reached out to her primary care doctor was started on elanzepine at that time.  Following the addition of olanzapine patient had an improvement of her symptoms including anxiety, ruminating thoughts, and sleep for several months.  Shortly after Thanksgiving patient reports missing a few doses of olanzapine on accident which resulted in severe anxiety and development of passive suicidality.  She restarted the medication with minor improvement and at that time decided to discontinue the Cymbalta due to it being too sedating.  Effexor was started in its place at 225 mg, patient experienced increased jitteriness, nausea, and loss of appetite after starting medication.  She tapered down to 150 mg and side effects improved.  Currently patient still endorses extreme anxiety and has been requiring Ativan 0.5 mg near daily to help manage her symptoms.  She still reports  poor sleep at night.  We discussed treatment options including medications, therapy, and self-care.  Patient did connect with a therapist recently which has been helpful so far.  She also does note that she enjoys reading for self-care.  In regards to medication patient is open to adjustments.  We discussed the olanzapine and the reason was started as well as potential side effects.  Of note patient has had a little tremor that started in the past few months which is a potential side effect of olanzapine.  We discussed tapering off of this medication and replacing it with mirtazapine to be used as an adjunct for her mood and anxiety symptoms in addition to help with sleep.  Risk and benefits of this medication were discussed.  We also reviewed the adverse effects of long-term benzodiazepine use especially as one gets older.  Patient was open to changing to Klonopin for longer term anxiety coverage with the plan to taper off this medication in the future.  Associated Signs/Symptoms: Depression  Symptoms:  depressed mood, anhedonia, fatigue, feelings of worthlessness/guilt, hopelessness, suicidal thoughts without plan, anxiety, panic attacks, loss of energy/fatigue, disturbed sleep, weight loss, (Hypo) Manic Symptoms:   Denies Anxiety Symptoms:  Excessive Worry, Panic Symptoms, Psychotic Symptoms:   Denies PTSD Symptoms: NA  Past Psychiatric History: No prior psychiatric hospitalizations. No prior suicide attempts.  Patient did endorse passive SI in November of 2023 without any intent or plan.  She denies being connected with a psychiatrist in the past and had recently connected with a therapist at [facility] therapy.  Has taken Effexor, Cymbalta, Ativan, Xanax, gabapentin, BuSpar, Zyprexa.  Denies substance use other than 1 beer a day. Can increase to 2 when the weather is nice.  Previous Psychotropic Medications: Yes   Substance Abuse History in the last 12 months:  No.  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 LHC 6/16:  oOM1 60, pRCA 25 >> med Rx b. cath 3/19 2nd OM with 95% stenosis s/p synergy DES & anomalous RCA   CAP (community acquired pneumonia) 2016; 06/07/2016   Chronic bronchitis (HCC)    Chronic lower back pain    Complication of anesthesia    "think I have a hard time waking up from it"   COPD (chronic obstructive pulmonary disease) ([place])    Depression    mild   Diverticulitis    Diverticulosis    GERD (gastroesophageal reflux disease)    H/O hiatal hernia    Headache    "weekly" (08/23/2017)   History of echocardiogram    Echo 6/16:  Mod LVH, EF 60-65%, no RWMA, Gr 1 DD, trivial MR, normal LA size.   Hyperglycemia 11/20/2015   Hyperlipidemia, mixed 09/11/2007   Qualifier: Diagnosis of  By: Jerold Coombe   Did not tolerate Lipitor, zocor, Lovastatin, Pravastatin, Livalo, Crestor even low dose    IBS (irritable bowel syndrome)    Maxillary sinusitis    Normal cardiac stress test 11/2011   No evidence of ischemia or infarct.   Calculated ejection fraction 72%.   Obesity    OSA (obstructive sleep apnea) 02/2012   has stopped using  cpap   Osteoarthritis    Osteoporosis    Pneumonia 11/2011   "before 2013 I hadn't had pneumonia since I was a child" (04/13/2012)   Pulmonary nodules    S/P angioplasty with stent 08/23/17 ostial 2nd OM with DES synnergy 08/24/2017   Schatzki's ring     Past Surgical History:  Procedure Laterality Date   APPENDECTOMY  1989   CARDIAC CATHETERIZATION N/A 11/25/2014   Procedure: Right/Left Heart Cath and Coronary Angiography;  Surgeon: Belva Crome, MD;  Location: [facility];  Service: Cardiovascular;  Laterality: N/A;   [place]   COLONOSCOPY  03/2022    CORONARY ANGIOPLASTY WITH STENT PLACEMENT  08/23/2017   CORONARY STENT INTERVENTION N/A 08/23/2017   Procedure: CORONARY STENT INTERVENTION;  Surgeon: Burnell Blanks, MD;  Location: [facility];  Service: Cardiovascular;  Laterality: N/A;   HERNIA REPAIR  04/13/2012   VHR laparoscopic   LEFT HEART CATH AND CORONARY ANGIOGRAPHY N/A 08/23/2017   Procedure: LEFT HEART CATH AND CORONARY ANGIOGRAPHY;  Surgeon: Burnell Blanks,  MD;  Location: [facility];  Service: Cardiovascular;  Laterality: N/A;   VENTRAL HERNIA REPAIR  04/13/2012   Procedure: LAPAROSCOPIC VENTRAL HERNIA;  Surgeon: Adin Hector, MD;  Location: [facility];  Service: General;  Laterality: N/A;  laparoscopic repair of incarcerated hernia    Family Psychiatric History: Her grandfather committed suicide. Sister has anxiety/depression.  Family History:  Family History  Problem Relation Age of Onset   Breast cancer Mother    Hypertension Mother    Diabetes Mother    Cancer Mother        recurrent, metastatic breast cancer.    Diverticulosis Father    Prostate cancer Father    Breast cancer Sister    Cancer Sister        breast cancer, invasive ductal carcinoma in 2022,DCIS at 35 with 4 weeks of radiation, 5 years of Tamoxifen    Pulmonary embolism Brother        recurrent   Heart attack Maternal Grandfather    Cerebral palsy Son    Cancer Niece 37   Osteoporosis Niece    Stroke Neg Hx    Colon cancer Neg Hx    Esophageal cancer Neg Hx    Stomach cancer Neg Hx    Rectal cancer Neg Hx     Social History:   Social History   Socioeconomic History   Marital status: Married    Spouse name: Not on file   Number of children: 1   Years of education: 14   Highest education level: Associate degree: academic program  Occupational History   Occupation: Disabled   Tobacco Use   Smoking status: Never    Passive exposure: Never   Smokeless tobacco: Never  Vaping Use   Vaping Use: Never  used  Substance and Sexual Activity   Alcohol use: Yes    Comment: social use   Drug use: No   Sexual activity: Yes    Comment: gluten free, lives with husband and son with CP quadriplegia  Other Topics Concern   Not on file  Social History Narrative   Cares for son with cerebral palsy.    Lives at home with her husband and son.   Right-handed.   2 cups caffeine per day.   One story home   Social Determinants of Health   Financial Resource Strain: Low Risk  (10/19/2021)   Overall Financial Resource Strain (CARDIA)    Difficulty of Paying Living Expenses: Not hard at all  Food Insecurity: No Food Insecurity (10/19/2021)   Hunger Vital Sign    Worried About Running Out of Food in the Last Year: Never true    Ran Out of Food in the Last Year: Never true  Transportation Needs: No Transportation Needs (10/19/2021)   PRAPARE - Hydrologist (Medical): No    Lack of Transportation (Non-Medical): No  Physical Activity: Insufficiently Active (10/19/2021)   Exercise Vital Sign    Days of Exercise per Week: 7 days    Minutes of Exercise per Session: 20 min  Stress: Stress Concern Present (10/19/2021)   [place]    Feeling of Stress : Rather much  Social Connections: Moderately Integrated (10/19/2021)   Social Connection and Isolation Panel [NHANES]    Frequency of Communication with Friends and Family: More than three times a week    Frequency of Social Gatherings with Friends and Family: Once a week    Attends  Religious Services: More than 4 times per year    Active Member of Clubs or Organizations: No    Attends Archivist Meetings: Never    Marital Status: Married    Additional Social History: Currently living with her husband and her only son, [name]. Moved from [place], [place] in 2004 to [place] for medical reasons and their son. Caregivers and some church friends are the main supports. No  family in the area. She was on disability until she turned 55. 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 Christian with faith being a big part of her life.   Allergies:   Allergies  Allergen Reactions   Beclomethasone Dipropionate Hives and Other (See Comments)     weight gain   Flexeril [Cyclobenzaprine] Anxiety   Mometasone Furo-Formoterol Fum Hives and Other (See Comments)    weight gain   Sulfonamide Derivatives Hives and Rash   Statins     Myalgias, RLS    Metabolic Disorder Labs: Lab Results  Component Value Date   HGBA1C 5.6 05/26/2022   MPG 117 10/22/2008   No results found for: "PROLACTIN" Lab Results  Component Value Date   CHOL 208 (H) 05/26/2022   TRIG 113.0 05/26/2022   HDL 74.00 05/26/2022   CHOLHDL 3 05/26/2022   VLDL 22.6 05/26/2022   LDLCALC 111 (H) 05/26/2022   LDLCALC 95 07/06/2021   Lab Results  Component Value Date   TSH 1.43 05/26/2022    Therapeutic Level Labs: No results found for: "LITHIUM" No results found for: "CBMZ" No results found for: "VALPROATE"  Current Medications: Current Outpatient Medications  Medication Sig Dispense Refill   clonazePAM (KLONOPIN) 0.5 MG tablet Take 1 tablet (0.5 mg total) by mouth daily as needed for anxiety. 30 tablet 0   mirtazapine (REMERON SOL-TAB) 15 MG disintegrating tablet Take 0.5 tablets (7.5 mg total) by mouth at bedtime. 30 tablet 1   albuterol (VENTOLIN HFA) 108 (90 Base) MCG/ACT inhaler Inhale 1-2 puffs into the lungs every 6 (six) hours as needed. 8 g 2   aspirin 81 MG tablet Take 81 mg by mouth daily.     Benralizumab (FASENRA PEN) 30 MG/ML SOAJ Inject 1 mL (30 mg total) into the skin every 8 (eight) weeks. 1 mL 2   Bioflavonoid Products (ESTER C PO) Take 500 mg by mouth daily.     budesonide (ENTOCORT EC) 3 MG 24 hr capsule Take 3 caps a day for 4 weeks then take 2 caps daily for 4 weeks then take 1 capsule daily for 4 weeks. 222 capsule 0   Calcium-Magnesium-Vitamin D (CALCIUM MAGNESIUM PO) Take 1 tablet  by mouth daily.     Coenzyme Q10 (CO Q 10 PO) Take by mouth daily.     Evolocumab (REPATHA SURECLICK) 119 MG/ML SOAJ INJECT 1 PEN INTO THE SKIN EVERY 14 DAYS (Patient taking differently: INJECT 1 PEN INTO THE SKIN EVERY 21 DAYS) 6 mL 3   famotidine (PEPCID) 20 MG tablet TAKE 1 TABLET BY MOUTH TWICE A DAY 60 tablet 2   HYDROcodone bit-homatropine (HYCODAN) 5-1.5 MG/5ML syrup Take 5 mLs by mouth every 8 (eight) hours as needed for cough. 180 mL 0   ipratropium-albuterol (DUONEB) 0.5-2.5 (3) MG/3ML SOLN Take 3 mLs by nebulization every 6 (six) hours as needed. 120 mL 5   LORazepam (ATIVAN) 1 MG tablet TAKE 1/2 TO 1 TABLET BY MOUTH 2 TIMES A DAY AS NEEDED FOR ANXIETY OR SLEEP 30 tablet 2   nitroGLYCERIN (NITROSTAT) 0.4 MG SL  tablet PLACE 1 TABLET UNDER THE TONGUE EVERY 5 MINUTES AS NEEDED FOR CHEST PAIN. 25 tablet 8   OLANZapine (ZYPREXA) 5 MG tablet TAKE 1 TABLET (5 MG TOTAL) BY MOUTH AT BEDTIME. 30 tablet 1   oseltamivir (TAMIFLU) 75 MG capsule Take 1 capsule (75 mg total) by mouth 2 (two) times daily. 10 capsule 0   predniSONE (STERAPRED UNI-PAK 21 TAB) 10 MG (21) TBPK tablet Take as directed 21 tablet 0   PROLIA 60 MG/ML SOSY injection INJECT 60 MG INTO THE SKIN ONCE FOR 1 DOSE. 1 mL 0   venlafaxine XR (EFFEXOR XR) 75 MG 24 hr capsule Take 3 capsules (225 mg total) by mouth daily with breakfast. 90 capsule 3   VITAMIN D PO Take 5,000 Units by mouth daily.     No current facility-administered medications for this visit.    Psychiatric Specialty Exam: Review of Systems  There were no vitals taken for this visit.There is no height or weight on file to calculate BMI.  General Appearance: Well Groomed  Eye Contact:  Good  Speech:  Clear and Coherent  Volume:  Normal  Mood:  Anxious and Depressed  Affect:  Congruent  Thought Process:  Coherent, Goal Directed, and Linear  Orientation:  Full (Time, Place, and Person)  Thought Content:  Logical  Suicidal Thoughts:  No  Homicidal Thoughts:  No   Memory:  NA  Judgement:  Good  Insight:  Fair  Psychomotor Activity:  Mannerisms and facial tremor  Concentration:  Concentration: Good  Recall:  Good  Fund of Knowledge:Fair  Language: Good  Akathisia:  NA    AIMS (if indicated):  not done  Assets:  Communication Skills Desire for Improvement Housing Intimacy Transportation  ADL's:  Intact  Cognition: WNL  Sleep:  Fair   Screenings: GAD-7    [place] Office Visit from 06/30/2022 in [facility] Visit from 05/26/2022 in [facility] at [facility] Visit from 01/16/2017 in [facility] at [facility]  Total GAD-7 Score '11 11 3      '$ [place] Office Visit from 06/30/2022 in [facility] Office Visit from 05/26/2022 in [facility] Visit from 11/25/2021 in [facility] at [facility] from 10/19/2021 in [facility] Visit from 03/04/2021 in [facility] at [facility]  PHQ-2 Total Score 3 3 0 0 2  PHQ-9 Total Score '4 4 4 '$ -- 7      [place] Office Visit from 06/30/2022 in [facility] ED from 09/23/2021 in [facility]  ED from 08/15/2021 in [facility] No Risk No Risk No Risk        Collaboration of Care: Medication Management AEB medication prescription and Primary Care Provider AEB chart review  Patient/Guardian was advised Release of Information must be obtained prior to any record release in order to collaborate their care with an outside provider. Patient/Guardian was advised if they have not already done so to contact the registration department to sign all necessary forms in order for [name] to release  information regarding their care.   Consent: Patient/Guardian gives verbal consent for treatment and assignment of benefits for services provided during this visit. Patient/Guardian expressed understanding and agreed to  proceed.   Vista Mink, MD 1/11/20245:33 PM  80 minutes were spent in chart review, interview, psycho education, counseling, medical decision making, coordination of care and long-term prognosis.  Patient was given opportunity to ask question and all concerns and questions were addressed and answers. Excluding separately billable services.  Virtual Visit via Video Note  I connected with Marya Fossa on 06/30/22 at  3:00 PM EST by a video enabled telemedicine application and verified that I am speaking with the correct person using two identifiers.  Location: Patient: Home Provider: Home office   I discussed the limitations of evaluation and management by telemedicine and the availability of in person appointments. The patient expressed understanding and agreed to proceed.   I discussed the assessment and treatment plan with the patient. The patient was provided an opportunity to ask questions and all were answered. The patient agreed with the plan and demonstrated an understanding of the instructions.   The patient was advised to call back or seek an in-person evaluation if the symptoms worsen or if the condition fails to improve as anticipated.  I provided 55 minutes of non-face-to-face time during this encounter.   Vista Mink, MD

## 2022-07-08 ENCOUNTER — Ambulatory Visit: Payer: PPO | Admitting: Family

## 2022-07-18 ENCOUNTER — Telehealth (HOSPITAL_COMMUNITY): Payer: Self-pay | Admitting: *Deleted

## 2022-07-18 NOTE — Telephone Encounter (Signed)
Pt called regarding Effexor dosage. Pt was seen on 06/30/22 at that time taking Effexor 150 mg. Dose increased to 225 mg. Pt states that she is having surgery on 08/15/22 and still has some 37.5 mg caps at home and would to just add the 37.5 mg to the 150 mg (187.5 mg total). Pt is concerned about taking 225 mg with surgery coming up. Pt next appointment is scheduled for 08/04/22. Please review and advise.

## 2022-07-27 ENCOUNTER — Other Ambulatory Visit (HOSPITAL_COMMUNITY): Payer: Self-pay | Admitting: Psychiatry

## 2022-07-27 DIAGNOSIS — F32A Depression, unspecified: Secondary | ICD-10-CM

## 2022-08-01 ENCOUNTER — Telehealth (HOSPITAL_COMMUNITY): Payer: Self-pay

## 2022-08-01 DIAGNOSIS — F419 Anxiety disorder, unspecified: Secondary | ICD-10-CM

## 2022-08-01 MED ORDER — VENLAFAXINE HCL ER 37.5 MG PO CP24: 37.5000 mg | ORAL_CAPSULE | Freq: Every day | ORAL | 0 refills | Status: DC

## 2022-08-01 NOTE — Telephone Encounter (Signed)
Patient called stating that she is not doing well and she does not want to go on living she is going to cancel her appointment to have her surgery until her mental health is better or stable she seems to the Remeron is causing her to feel this way she is waking up depressed she does not think it's a good idea to have a major surgery with her mental status

## 2022-08-01 NOTE — Pre-Procedure Instructions (Signed)
Surgical Instructions    Your procedure is scheduled on August 15, 2022.  Report to [facility] Main Entrance "A" at 5:30 A.M., then check in with the Admitting office.  Call this number if you have problems the morning of surgery:  [phone]  If you have any questions prior to your surgery date call [phone]: Open Monday-Friday 8am-4pm If you experience any cold or flu symptoms such as cough, fever, chills, shortness of breath, etc. between now and your scheduled surgery, please notify us at the above number.     Remember:  Do not eat after midnight the night before your surgery  You may drink clear liquids until 4:30 AM the morning of your surgery.   Clear liquids allowed are: Water, Non-Citrus Juices (without pulp), Carbonated Beverages, Clear Tea, Black Coffee Only (NO MILK, CREAM OR POWDERED CREAMER of any kind), and Gatorade.     Take these medicines the morning of surgery with A SIP OF WATER:  clonazePAM (KLONOPIN)   famotidine (PEPCID)   venlafaxine XR (EFFEXOR XR)     May take these medicines morning of surgery AS NEEDED:  albuterol (VENTOLIN HFA) inhaler   ipratropium-albuterol (DUONEB)   nitroGLYCERIN (NITROSTAT)      DO NOT take your  Evolocumab (REPATHA SURECLICK) three days prior to surgery.   Follow your surgeon's instructions on when to stop Aspirin.  If no instructions were given by your surgeon then you will need to call the office to get those instructions.     Starting February 18th, STOP taking any Aleve, Naproxen, Ibuprofen, Motrin, Advil, Goody's, BC's, all herbal medications, fish oil, and all vitamins.                     Do NOT Smoke (Tobacco/Vaping) for 24 hours prior to your procedure.  If you use a CPAP at night, you may bring your mask/headgear for your overnight stay.   Contacts, glasses, piercing's, hearing aid's, dentures or partials may not be worn into surgery, please bring cases for these belongings.    For patients admitted  to the hospital, discharge time will be determined by your treatment team.   Patients discharged the day of surgery will not be allowed to drive home, and someone needs to stay with them for 24 hours.   SURGICAL WAITING ROOM VISITATION Patients having surgery or a procedure may have no more than 2 support people in the waiting area - these visitors may rotate.   Children under the age of 75 must have an adult with them who is not the patient. If the patient needs to stay at the hospital during part of their recovery, the visitor guidelines for inpatient rooms apply. Pre-op nurse will coordinate an appropriate time for 1 support person to accompany patient in pre-op.  This support person may not rotate.   Please refer to the [facility] website for the visitor guidelines for Inpatients (after your surgery is over and you are in a regular room).    Special instructions:   [place]- Preparing For Surgery  Before surgery, you can play an important role. Because skin is not sterile, your skin needs to be as free of germs as possible. You can reduce the number of germs on your skin by washing with CHG (chlorahexidine gluconate) Soap before surgery.  CHG is an antiseptic cleaner which kills germs and bonds with the skin to continue killing germs even after washing.    Oral Hygiene is also important to reduce your  risk of infection.  Remember - BRUSH YOUR TEETH THE MORNING OF SURGERY WITH YOUR REGULAR TOOTHPASTE  Please do not use if you have an allergy to CHG or antibacterial soaps. If your skin becomes reddened/irritated stop using the CHG.  Do not shave (including legs and underarms) for at least 48 hours prior to first CHG shower. It is OK to shave your face.  Please follow these instructions carefully.   Shower the NIGHT BEFORE SURGERY and the MORNING OF SURGERY  If you chose to wash your hair, wash your hair first as usual with your normal shampoo.  After you shampoo, rinse your hair  and body thoroughly to remove the shampoo.  Use CHG Soap as you would any other liquid soap. You can apply CHG directly to the skin and wash gently with a scrungie or a clean washcloth.   Apply the CHG Soap to your body ONLY FROM THE NECK DOWN.  Do not use on open wounds or open sores. Avoid contact with your eyes, ears, mouth and genitals (private parts). Wash Face and genitals (private parts)  with your normal soap.   Wash thoroughly, paying special attention to the area where your surgery will be performed.  Thoroughly rinse your body with warm water from the neck down.  DO NOT shower/wash with your normal soap after using and rinsing off the CHG Soap.  Pat yourself dry with a CLEAN TOWEL.  Wear CLEAN PAJAMAS to bed the night before surgery  Place CLEAN SHEETS on your bed the night before your surgery  DO NOT SLEEP WITH PETS.   Day of Surgery: Take a shower with CHG soap. Do not wear jewelry or makeup Do not wear lotions, powders, perfumes/colognes, or deodorant. Do not shave 48 hours prior to surgery.   Do not bring valuables to the hospital.  [facility] is not responsible for any belongings or valuables. Do not wear nail polish, gel polish, artificial nails, or any other type of covering on natural nails (fingers and toes) If you have artificial nails or gel coating that need to be removed by a nail salon, please have this removed prior to surgery. Artificial nails or gel coating may interfere with anesthesia's ability to adequately monitor your vital signs.  Wear Clean/Comfortable clothing the morning of surgery Remember to brush your teeth WITH YOUR REGULAR TOOTHPASTE.   Please read over the following fact sheets that you were given.    If you received a COVID test during your pre-op visit  it is requested that you wear a mask when out in public, stay away from anyone that may not be feeling well and notify your surgeon if you develop symptoms. If you have been in  contact with anyone that has tested positive in the last 10 days please notify you surgeon.

## 2022-08-02 ENCOUNTER — Telehealth (HOSPITAL_COMMUNITY): Payer: Self-pay | Admitting: Psychiatry

## 2022-08-02 ENCOUNTER — Encounter (HOSPITAL_COMMUNITY): Payer: Self-pay

## 2022-08-02 ENCOUNTER — Encounter (HOSPITAL_COMMUNITY)
Admission: RE | Admit: 2022-08-02 | Discharge: 2022-08-02 | Disposition: A | Discharge status: Home / Self Care | Payer: PPO | Source: Ambulatory Visit | Attending: Obstetrics and Gynecology | Admitting: Obstetrics and Gynecology

## 2022-08-02 ENCOUNTER — Telehealth (HOSPITAL_COMMUNITY): Payer: Self-pay | Admitting: *Deleted

## 2022-08-02 ENCOUNTER — Other Ambulatory Visit: Payer: Self-pay

## 2022-08-02 ENCOUNTER — Ambulatory Visit (HOSPITAL_COMMUNITY)
Admission: EM | Admit: 2022-08-02 | Discharge: 2022-08-02 | Disposition: A | Discharge status: Home / Self Care | Payer: PPO | Attending: Psychiatry | Admitting: Psychiatry

## 2022-08-02 VITALS — BP 115/64 | HR 94 | Temp 97.4°F | Resp 17 | Ht 61.0 in | Wt 153.0 lb

## 2022-08-02 DIAGNOSIS — J4489 Other specified chronic obstructive pulmonary disease: Secondary | ICD-10-CM | POA: Insufficient documentation

## 2022-08-02 DIAGNOSIS — M1990 Unspecified osteoarthritis, unspecified site: Secondary | ICD-10-CM | POA: Insufficient documentation

## 2022-08-02 DIAGNOSIS — G4733 Obstructive sleep apnea (adult) (pediatric): Secondary | ICD-10-CM | POA: Diagnosis not present

## 2022-08-02 DIAGNOSIS — F419 Anxiety disorder, unspecified: Secondary | ICD-10-CM | POA: Diagnosis not present

## 2022-08-02 DIAGNOSIS — Z01818 Encounter for other preprocedural examination: Secondary | ICD-10-CM | POA: Insufficient documentation

## 2022-08-02 DIAGNOSIS — K449 Diaphragmatic hernia without obstruction or gangrene: Secondary | ICD-10-CM | POA: Insufficient documentation

## 2022-08-02 DIAGNOSIS — E785 Hyperlipidemia, unspecified: Secondary | ICD-10-CM | POA: Diagnosis not present

## 2022-08-02 DIAGNOSIS — N8189 Other female genital prolapse: Secondary | ICD-10-CM | POA: Insufficient documentation

## 2022-08-02 DIAGNOSIS — K219 Gastro-esophageal reflux disease without esophagitis: Secondary | ICD-10-CM | POA: Diagnosis not present

## 2022-08-02 DIAGNOSIS — R0789 Other chest pain: Secondary | ICD-10-CM | POA: Insufficient documentation

## 2022-08-02 DIAGNOSIS — M549 Dorsalgia, unspecified: Secondary | ICD-10-CM | POA: Diagnosis not present

## 2022-08-02 DIAGNOSIS — F32A Depression, unspecified: Secondary | ICD-10-CM | POA: Diagnosis not present

## 2022-08-02 DIAGNOSIS — I2510 Atherosclerotic heart disease of native coronary artery without angina pectoris: Secondary | ICD-10-CM | POA: Insufficient documentation

## 2022-08-02 DIAGNOSIS — G8929 Other chronic pain: Secondary | ICD-10-CM | POA: Insufficient documentation

## 2022-08-02 LAB — CBC
HCT: 41.2 % (ref 36.0–46.0)
Hemoglobin: 13.2 g/dL (ref 12.0–15.0)
MCH: 29.8 pg (ref 26.0–34.0)
MCHC: 32 g/dL (ref 30.0–36.0)
MCV: 93 fL (ref 80.0–100.0)
Platelets: 300 10*3/uL (ref 150–400)
RBC: 4.43 MIL/uL (ref 3.87–5.11)
RDW: 13.5 % (ref 11.5–15.5)
WBC: 10.9 10*3/uL — ABNORMAL HIGH (ref 4.0–10.5)
nRBC: 0 % (ref 0.0–0.2)

## 2022-08-02 LAB — BASIC METABOLIC PANEL
Anion gap: 10 (ref 5–15)
BUN: 11 mg/dL (ref 8–23)
CO2: 24 mmol/L (ref 22–32)
Calcium: 9 mg/dL (ref 8.9–10.3)
Chloride: 99 mmol/L (ref 98–111)
Creatinine, Ser: 0.59 mg/dL (ref 0.44–1.00)
GFR, Estimated: >60 mL/min (ref >60)
Glucose, Bld: 100 mg/dL — ABNORMAL HIGH (ref 70–99)
Potassium: 3.8 mmol/L (ref 3.5–5.1)
Sodium: 133 mmol/L — ABNORMAL LOW (ref 135–145)

## 2022-08-02 LAB — TYPE AND SCREEN
ABO/RH(D): O POS
Antibody Screen: NEGATIVE

## 2022-08-02 LAB — SURGICAL PCR SCREEN
MRSA, PCR: POSITIVE — AB
Staphylococcus aureus: POSITIVE — AB

## 2022-08-02 NOTE — Discharge Summary (Signed)
RNCM consulted regarding pt needing to talk to someone about depression. RNCM discussed with EDSW who suggests pt visit Behavior Health Urgent Care or check in to ED for psych evaluation.  EDSW suggestion relayed to Victoria [name].

## 2022-08-02 NOTE — Progress Notes (Signed)
PCP - Dr. [name] Cardiologist - Dr. Glenetta Hew  PPM/ICD - Denies Device Orders - n/a Rep Notified - n/a  Chest x-ray - n/a EKG - 08/02/2022 Stress Test - 03/15/2019 ECHO - 03/20/2019 Cardiac Cath - 08/23/2017 with one DES placed  Sleep Study - Positive for OSA in 2008 but couldn't tolerate the mask due to her asthma. Has not worn CPAP in 15+ years  No DM  Last dose of GLP1 agonist- n/a GLP1 instructions: n/a  Blood Thinner Instructions: n/a Aspirin Instructions: Pt instructed to reach out to Dr. Helane Rima to receive instructions on when to stop ASA  NPO after midnight  COVID TEST- n/a   Anesthesia review: Yes. Cardiac Hx. Pt also endorses increased depression with suicidal thoughts at PAT appointment. See note from this RN for additional information and interventions. Pt unsure if she wants to proceed with surgery given depression. Pt notified Dr. Helane Rima of this and was advised to complete pre-admission testing. Pt will then follow-up with Dr. Helane Rima one week prior to surgery to discuss if she would still like to proceed.    Patient denies shortness of breath, fever, cough and chest pain at PAT appointment   All instructions explained to the patient, with a verbal understanding of the material. Patient agrees to go over the instructions while at home for a better understanding. Patient also instructed to self quarantine after being tested for COVID-19. The opportunity to ask questions was provided.

## 2022-08-02 NOTE — ED Provider Notes (Signed)
Behavioral Health Urgent Care Medical Screening Exam  Patient Name: Victoria [name] MRN: [id number] Date of Evaluation: 08/02/22 Chief Complaint:   Diagnosis:  Final diagnoses:  Anxiety and depression    History of Present illness: Victoria [name] is a 67 y.o. female.  Presents to [facility] accompanied by her husband.  She reports she was at a doctors appointment for pre- screening for a upcoming scheduled surgery.  States she had high ratings on her PHQ-9 and was referred over for further evaluation.   Reports she has been experiencing fatigue, low motivation and increased depression over the past few months.  She reports her mental health started to decline after COVID diagnoses in late November.   Stated her mental health was  has managed by her primary care provider with Cymbalta and Effexor for many years however, stated that she reached a plateau and was referred to therapy and psychiatry earlier part of this year. Reports she is currently followed by Psychiatrist Nelida Gores. Stated she recently had a medication adjustment to her Effexor and Remeron.  However, she continues to feel depressed.  Victoria [name] her psychiatrist adjusted She reports multiple stressors related to caring for her son who is 49 years old. Stated that he requires 24 hour care. (cystic fibrosis) and her upcomming surgery. She stated that her husband is supportive. Victoria [name] is denying suicidal or homicidal ideations. Denied previous inpatient admission, self injurious behaviors or suicidal attempts. Patient was offered inpatients admission however she declined.  During evaluation Victoria [name] is sitting in no acute distress. She is alert/oriented x 4; calm/cooperative; and mood congruent with affect. She is speaking in a clear tone at moderate volume, and normal pace; with good eye contact. Her thought process is coherent and relevant; There is no indication that she is currently responding to internal/external  stimuli or experiencing delusional thought content; and she has denied suicidal/self-harm/homicidal ideation, psychosis, and paranoia.   Patient has remained calm throughout assessment and has answered questions appropriately.     At this time Victoria [name] is educated and verbalizes understanding of mental health resources and other crisis services in the community.She is instructed to call 911 and present to the nearest emergency room should she experience any suicidal/homicidal ideation, auditory/visual/hallucinations, or detrimental worsening of her mental health condition.  She was a also advised by Probation officer that she could call the toll-free phone on insurance card to assist with identifying in network counselors and agencies or number on back of Medicaid card to speak with care coordinator.     [place] [facility] from 08/02/2022 in [facility] Most recent reading at 08/02/2022 12:53 PM Pre-Admission Testing 60 from 08/02/2022 in [facility] PREADMISSION TESTING Most recent reading at 08/02/2022 11:00 AM Office Visit from 06/30/2022 in [facility] Most recent reading at 06/30/2022  3:50 PM  C-SSRS RISK CATEGORY Low Risk Moderate Risk No Risk       Psychiatric Specialty Exam  Presentation  General Appearance:Appropriate for Environment  Eye Contact:Good  Speech:Clear and Coherent  Speech Volume:Decreased  Handedness:Right   Mood and Affect  Mood:Anxious; Depressed  Affect:Congruent   Thought Process  Thought Processes:Coherent  Descriptions of Associations:Intact  Orientation:Full (Time, Place and Person)  Thought Content:Logical    Hallucinations:None  Ideas of Reference:None  Suicidal Thoughts:Yes, Passive Without Intent; Without Plan  Homicidal Thoughts:No   Sensorium  Memory:Recent Good; Remote Good; Immediate Good  Judgment:Fair  Insight:Fair   Community education officer  Concentration:Fair  Attention Span:Good  Recall:Good  Fund of Knowledge:Good  Language:Good   Psychomotor Activity  Psychomotor Activity:Normal   Assets  Assets:Desire for Improvement; Social Support   Sleep  Sleep:Fair  Number of hours: No data recorded  Physical Exam: Physical Exam Vitals and nursing note reviewed.  Cardiovascular:     Rate and Rhythm: Normal rate and regular rhythm.  Pulmonary:     Effort: Pulmonary effort is normal.     Breath sounds: Normal breath sounds.  Neurological:     Mental Status: She is oriented to person, place, and time.  Psychiatric:        Mood and Affect: Mood normal.        Behavior: Behavior normal.        Thought Content: Thought content normal.    Review of Systems  Constitutional: Negative.   Eyes: Negative.   Cardiovascular: Negative.   Psychiatric/Behavioral:  Positive for depression. Negative for suicidal ideas. The patient is nervous/anxious.   All other systems reviewed and are negative.  Blood pressure 133/78, pulse 88, temperature 98.1 F (36.7 C), temperature source Oral, resp. rate 18, SpO2 100 %. There is no height or weight on file to calculate BMI.  Musculoskeletal: Strength & Muscle Tone: within normal limits Gait & Station: normal Patient leans: N/A   [place] MSE Discharge Disposition for Follow up and Recommendations: Based on my evaluation the patient does not appear to have an emergency medical condition and can be discharged with resources and follow up care in outpatient services for Partial Hospitalization Program and Group Therapy   [place], NP 08/02/2022, 2:02 PM

## 2022-08-02 NOTE — Progress Notes (Signed)
   08/02/22 1225  [place] (Walk-ins at [facility] only)  How Did You Hear About [name]? Primary Care  What Is the Reason for Your Visit/Call Today? Patient presents following an appointment with her PCP, during which she reported passive SI.  Apparently, patient scored high on the PHQ9 assessment and was referred for further assessment.  Patient appears depressed, reporting low motivation, low energy and hopelessness.  Patient sees Dr. Nelida Gores with [facility] and has been in contact with him regarding effects of medications.  She reports he recently added Remeron, and then he discontinued it since she reported increased depression the day after she started it.  This morning, she reports feeling even more depressed after not taking the Remeron.  She has contacted Dr. Nelida Gores about this and his RN reached out to [facility] to discuss options for assessment.  They sent patient over for evaluation.  Patient denies SI, however she reports wishing she was dead at times.  Patient has an upcoming surgery, and states she would like to "feel better" and get her medications adjusted prior to the surgery.  Patient is followed by [facility] with the [facility] for outpatient therapy.  Patient will be provided with referral information for PHP and IOP programs.  How Long Has This Been Causing You Problems? 1 wk - 1 month  Have You Recently Had Any Thoughts About Hurting Yourself? No  Are You Planning to Commit Suicide/Harm Yourself At This time? No  Have you Recently Had Thoughts About [place]? No  Are You Planning To Harm Someone At This Time? No  Are you currently experiencing any auditory, visual or other hallucinations? No  Have You Used Any Alcohol or Drugs in the Past 24 Hours? No  Do you have any current medical co-morbidities that require immediate attention? No  Clinician description of patient physical appearance/behavior: Patient is calm, pleasant, presenting with flat affect.  She is  AAOx5.  What Do You Feel Would Help You the Most Today? Treatment for Depression or other mood problem  If access to [facility] was not available, would you have sought care in the Emergency Department? No  Determination of Need Routine (7 days)  Options For Referral Medication Management;Intensive Outpatient Therapy;Partial Hospitalization

## 2022-08-02 NOTE — Telephone Encounter (Signed)
D:  Ricky Ala, NP referred pt to MH-IOP.  A:  Placed call and oriented pt.  Pt states she just doesn't want to be in a group listening to other people issues.  "I think that would just overwhelm me."  Encouraged pt to call the case manager if she changes her mind and would like to try it.  Pt states she has a f/u appt with Dr. Nelida Gores on 08-04-22.  Inform Ricky Ala, NP and Dr. Nelida Gores.

## 2022-08-02 NOTE — Progress Notes (Signed)
Social worker from the ED notified of the pt's depression and needing to talk with someone. Victoria [name] advised to have the pt seen for her concerns. PAT nurse made aware, and she will relay to the pt.

## 2022-08-02 NOTE — Discharge Instructions (Signed)

## 2022-08-02 NOTE — Progress Notes (Signed)
Pt arrived for pre-admission appointment for upcoming surgery August 15, 2022. Upon asking how pt was doing, she responded with "not very well." Pt endorsed that her depression was worse this morning with physically debilitating symptoms, for which she has never felt physically depressed before. She states that she was so physically depressed that she was unable to drive herself to her appointment today so her husband did. She continued to explain that last year was a very difficult year for her family (she and her husband care for their 69 year old son with a disability) and she has continued to become more and more depressed. RN inquired about suicidal thoughts, plans, and actions to which the pt endorses suicidal thoughts as recent as this morning. She recently told her husband last night that she "no longer wanted to live" which was the first time she had told him. She endorses thinking of ways that she could end her life, but she has never had an actual plan or attempted to actually harm herself.  She has been working closely with her psychiatrist Dr. [name] and her PCP Dr. [name] to help navigate treatment. Dr. Nelida Gores, on 08/01/22, recommended stopping her Remeron last night (2/12) to see if that helped with depressive feelings/thoughts to which the pt stopped, but unfortunately woke up with worse depression this morning. She stated that she was hoping that she will get a call back from Dr. Nelida Gores this afternoon to see if she needed to restart the medication, but was also concerned that she may need more urgent help at this time.  Emotional support was provided at length to patient. Pt also was not sure if she wanted to go through with surgery, which she has already voiced to Dr. Helane Rima, her surgeon, prior to today's appointment. They decided to continue with pre-op appointment and then the pt and surgeon would meet up the week prior to surgery to discuss the plan.  Social Worker was  contacted for additional support and resources to help patient. Decision was made that it would be in patients best and safest interest to either be evaluated at [facility] ED or be taken to [facility]. Pt agreed to being seen at [facility] this afternoon.   RN wrote down Urgent Care address and phone number for patient and walked with pt out to discuss plan with patients husband, [name]. RN instructed pt and husband to go straight to the Behavioral Health Urgent care after they left the hospital. Pt and husband understood instructions and all questions answered.

## 2022-08-02 NOTE — Telephone Encounter (Signed)
Pt called with c/o not sleeping r/t not taking Remeron last night and says she feels "pretty terrible" today. Pt has d/c Zyprexa, restarted Effexor, and d/c Remeron. Pt is requesting to go up on Remeron dose to a whole 15 mg tab QHS. Pt has an upcoming appointment on 08/04/22. Please review and advise.

## 2022-08-03 ENCOUNTER — Encounter (HOSPITAL_COMMUNITY): Payer: Self-pay | Admitting: Vascular Surgery

## 2022-08-03 NOTE — Progress Notes (Signed)
Anesthesia Chart Review:  Case: Victoria [name] Date/Time: 08/15/22 0715   Procedures:      HYSTERECTOMY ABDOMINAL WITH SALPINGO-OOPHORECTOMY (Bilateral)     ANTERIOR AND POSTERIOR REPAIR   Anesthesia type: General   Pre-op diagnosis: PALB2 MUTATION, PROLAPSE   Location: [facility] OR ROOM 08 / [facility]   Surgeons: Dian Queen, MD       DISCUSSION: Patient is 67 year old female scheduled for the above procedure.   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 For anesthesia history, she reported, "think I have a hard time waking up from it."  Last cardiology visit with Dr. Johney Frame was on 04/07/22. Zio monitor was ordered for palpitations and showed predominant sinus rhythm, 6 runs of SVT lasting up to 15 beats, rare ectopy, no sustained arrhythmias or significant pauses.  Triggered events correlated with NSR. CAD was felt overall stable but with some episode of atypical chest pain. Normal Myoview in 02/2019. She was offered a repeat Myoview, but she declined. Chest pains described as atypical, and notes do not suggest that there was a strong need for repeat stress testing, but since it was offered and she does have known CAD would recommend preoperative cardiology input. I will notify Dr. Christen Butter staff.  She denied shortness of breath and chest pain at PAT visit.  Also of note, at her 08/02/22 PAT visit, she reported exacerbation of her depression and needed acute evaluation with Behavioral [facility]. See notes in [facility]. She is unsure if she wants to proceed with surgery given depression. She reported  notifying Dr. Helane Rima of this with plans for re-evaluation about one week prior to surgery and will re-discuss and decide at that time.    VS: BP 115/64   Pulse 94   Temp (!) 36.3 C   Resp 17   Ht 5' 1"$  (1.549 m)   Wt 69.4 kg   SpO2 100%   BMI 28.91 kg/m    PROVIDERS: Mosie Lukes, MD is PCP Gwyndolyn Kaufman, MD is cardiologist Bo Merino, MD is rheumatologist Benay Pike, MD is HEM. Seen for IDA in 2022.  Zenovia Jarred, MD is GI  Brand Males, MD is pulmonologist. Last visit 03/07/22. She was recovering for recent COVID-19, but cough was resolving. Continue Fasenra, restart Asmanex, continue albuterol as needed. Six month follow-up planned.  [name], MD is psychiatrist    LABS: Labs reviewed: Acceptable for surgery. (all labs ordered are listed, but only abnormal results are displayed)  Labs Reviewed  SURGICAL PCR SCREEN - Abnormal; Notable for the following components:      Result Value   MRSA, PCR POSITIVE (*)    Staphylococcus aureus POSITIVE (*)    All other components within normal limits  BASIC METABOLIC PANEL - Abnormal; Notable for the following components:   Sodium 133 (*)    Glucose, Bld 100 (*)    All other components within normal limits  CBC - Abnormal; Notable for the following components:   WBC 10.9 (*)    All other components within normal limits  TYPE AND SCREEN    PFTs 10/19/17: FVC 2.65 (86%), post 2.57 (84%). FEV1 1.79 (75%), post 1.80 (76%), DLCO unc 19.51 (90%).  IMAGES: CTA Chest 04/16/21: IMPRESSION: 1. No aneurysm or acute findings. 2. Coronary calcifications. The severity of coronary artery disease and any potential stenosis cannot be assessed on this non-gated CT examination. Assessment for potential risk factor modification, dietary therapy or pharmacologic therapy may be warranted, if clinically indicated. 3. Stable moderate hiatal hernia and left diaphragmatic eventration.    EKG: 08/02/22:  NSR   CV: Long term monitor 04/16/22 - 04/23/22:   Patch wear time was 6 days and 22 hours   Predominant rhythm was NSR with average HR 78bpm   There were 6 runs of SVT with longest lasting 15 beats   Rare ectopy (<1% SVE and <1% VE)   No sustained arrhythmias or significant pauses   Patient triggered events correlate with NSR     Echo 03/20/19: IMPRESSIONS   1. Left ventricular ejection fraction, by visual estimation, is 60 to  65%. The left ventricle has normal function. Normal left ventricular size.  Mildly increased left ventricular posterior wall thickness. There is  mildly increased left ventricular  hypertrophy.   2. Elevated left ventricular end-diastolic pressure.   3. Left ventricular diastolic Doppler parameters are consistent with  impaired relaxation pattern of LV diastolic filling.   4. Global right ventricle has normal systolic function.The right  ventricular size is normal. No increase in right ventricular wall  thickness.   5. Left atrial size was normal.   6. Right atrial size was normal.   7. The mitral valve is normal in structure. No evidence of mitral valve  regurgitation. No evidence of mitral stenosis.   8. The tricuspid valve is normal in structure. Tricuspid valve  regurgitation is trivial.   9. The aortic valve is normal in structure. Aortic valve regurgitation is  mild by color flow Doppler. Structurally normal aortic valve, with no  evidence of sclerosis or stenosis.  10. The pulmonic valve was normal in structure. Pulmonic valve  regurgitation is not visualized by color flow Doppler.  11. Aortic dilatation noted.  12. There is mild dilatation of the ascending aorta measuring 36 mm.  13. Normal pulmonary artery systolic pressure.  14. The inferior vena cava is normal in size with greater than 50%  respiratory variability, suggesting right atrial pressure of 3 mmHg.    Nuclear stress test 03/15/19: Nuclear stress EF: 74%. There was no ST segment  deviation noted during stress. No T wave inversion was noted during stress. Blood pressure demonstrated a hypertensive response to exercise. The study is normal. This is a low risk study.  Low risk stress nuclear study with normal perfusion and normal left ventricular regional and global systolic function.   US Carotid 03/12/19: Summary:  - Right Carotid: Velocities in the right ICA are consistent with a 1-39%  stenosis.  - Left Carotid: Velocities in the left ICA are consistent with a 1-39%  stenosis. Non-hemodynamically significant plaque <50% noted in the  CCA.  - Vertebrals: Bilateral vertebral arteries demonstrate antegrade flow.  - Subclavians: Normal flow hemodynamics were seen in bilateral subclavian arteries.    Cardiac cath 08/23/17: Conclusion: Ost 2nd Mrg lesion is 95% stenosed. A drug-eluting stent was successfully placed using a STENT SYNERGY DES 3X16. Post intervention, there is a 0% residual stenosis. The left ventricular systolic function is normal. LV end diastolic pressure is normal. The left ventricular ejection fraction is 55-65% by visual estimate. There is no mitral valve regurgitation.   1. Severe single vessel CAD with severe stenosis in the ostium of the large  obtuse marginal branch.  2. Successful PTCA/DES x 1 ostium of the OM. 3. 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  4. The LAD has no obstructive disease.  5. Normal LV systolic function   Past Medical History:  Diagnosis Date   Allergic bronchopulmonary aspergillosis ([place]) 2008   sees Dr Edmund Hilda pulmonology   Anemia    iron deficiency, resolved   Anxiety    Asthma    CAD (coronary artery disease)    a. LHC 6/16:  oOM1 60, pRCA 25 >> med Rx b. cath 3/19 2nd OM with 95% stenosis s/p synergy DES & anomalous RCA   CAP (community acquired pneumonia) 2016; 06/07/2016   Chronic bronchitis ([facility])    Chronic lower back pain     Complication of anesthesia    "think I have a hard time waking up from it"   COPD (chronic obstructive pulmonary disease) ([place])    Depression    mild   Diverticulitis    Diverticulosis    GERD (gastroesophageal reflux disease)    H/O hiatal hernia    Headache    "weekly" (08/23/2017)   History of echocardiogram    Echo 6/16:  Mod LVH, EF 60-65%, no RWMA, Gr 1 DD, trivial MR, normal LA size.   Hyperglycemia 11/20/2015   Hyperlipidemia, mixed 09/11/2007   Qualifier: Diagnosis of  By: Jerold Coombe   Did not tolerate Lipitor, zocor, Lovastatin, Pravastatin, Livalo, Crestor even low dose    IBS (irritable bowel syndrome)    Maxillary sinusitis    Normal cardiac stress test 11/2011   No evidence of ischemia or infarct.   Calculated ejection fraction 72%.   Obesity    OSA (obstructive sleep apnea) 02/2012   has stopped using  cpap   Osteoarthritis    Osteoporosis    Pneumonia 11/2011   "before 2013 I hadn't had pneumonia since I was a child" (04/13/2012)   Pulmonary nodules    S/P angioplasty with stent 08/23/17 ostial 2nd OM with DES synnergy 08/24/2017   Schatzki's ring     Past Surgical History:  Procedure Laterality Date   APPENDECTOMY  1989   CARDIAC CATHETERIZATION N/A 11/25/2014   Procedure: Right/Left Heart Cath and Coronary Angiography;  Surgeon: Belva Crome, MD;  Location: [facility];  Service: Cardiovascular;  Laterality: N/A;   [place]   COLONOSCOPY  03/2022   CORONARY ANGIOPLASTY WITH STENT PLACEMENT  08/23/2017   CORONARY STENT INTERVENTION N/A 08/23/2017   Procedure: CORONARY STENT INTERVENTION;  Surgeon: Burnell Blanks, MD;  Location: [facility];  Service: Cardiovascular;  Laterality: N/A;   HERNIA REPAIR  04/13/2012   VHR laparoscopic   LEFT HEART CATH AND CORONARY ANGIOGRAPHY N/A 08/23/2017   Procedure: LEFT HEART CATH AND CORONARY ANGIOGRAPHY;  Surgeon: Burnell Blanks, MD;  Location: [place] [facility];  Service:  Cardiovascular;  Laterality: N/A;   VENTRAL HERNIA REPAIR  04/13/2012   Procedure: LAPAROSCOPIC VENTRAL HERNIA;  Surgeon: Adin Hector, MD;  Location: [facility];  Service: General;  Laterality: N/A;  laparoscopic repair of incarcerated hernia    MEDICATIONS:  albuterol (VENTOLIN HFA) 108 (90 Base) MCG/ACT inhaler   aspirin 81 MG tablet   Benralizumab (FASENRA PEN) 30 MG/ML SOAJ   Bioflavonoid Products (ESTER C PO)   budesonide (ENTOCORT EC) 3 MG 24 hr capsule   Calcium-Magnesium-Vitamin D (CALCIUM MAGNESIUM PO)   clonazePAM (KLONOPIN)  0.5 MG tablet   Coenzyme Q10 (CO Q 10 PO)   Evolocumab (REPATHA SURECLICK) [id number] MG/ML SOAJ   famotidine (PEPCID) 20 MG tablet   HYDROcodone bit-homatropine (HYCODAN) 5-1.5 MG/5ML syrup   ipratropium-albuterol (DUONEB) 0.5-2.5 (3) MG/3ML SOLN   LORazepam (ATIVAN) 1 MG tablet   nitroGLYCERIN (NITROSTAT) 0.4 MG SL tablet   OLANZapine (ZYPREXA) 5 MG tablet   oseltamivir (TAMIFLU) 75 MG capsule   predniSONE (STERAPRED UNI-PAK 21 TAB) 10 MG (21) TBPK tablet   PROLIA 60 MG/ML SOSY injection   venlafaxine XR (EFFEXOR XR) 37.5 MG 24 hr capsule   venlafaxine XR (EFFEXOR XR) 75 MG 24 hr capsule   VITAMIN D PO   No current facility-administered medications for this encounter.  By medication list, she is not currently taking Sterapred, Tamiflu, Zyprexa, Ativan, Hycodan.  Myra Gianotti, PA-C Surgical Short Stay/Anesthesiology [facility] Phone [phone] [facility] Phone [phone] 08/03/2022 6:19 PM

## 2022-08-04 ENCOUNTER — Encounter (HOSPITAL_COMMUNITY): Payer: Self-pay | Admitting: Psychiatry

## 2022-08-04 ENCOUNTER — Telehealth: Payer: Self-pay | Admitting: *Deleted

## 2022-08-04 ENCOUNTER — Telehealth (HOSPITAL_BASED_OUTPATIENT_CLINIC_OR_DEPARTMENT_OTHER): Payer: PPO | Admitting: Psychiatry

## 2022-08-04 DIAGNOSIS — F419 Anxiety disorder, unspecified: Secondary | ICD-10-CM

## 2022-08-04 DIAGNOSIS — G4733 Obstructive sleep apnea (adult) (pediatric): Secondary | ICD-10-CM

## 2022-08-04 DIAGNOSIS — F32A Depression, unspecified: Secondary | ICD-10-CM | POA: Diagnosis not present

## 2022-08-04 MED ORDER — CLONAZEPAM 0.5 MG PO TABS: 0.5000 mg | ORAL_TABLET | Freq: Every day | ORAL | 0 refills | Status: DC

## 2022-08-04 MED ORDER — TRAZODONE HCL 50 MG PO TABS: 50.0000 mg | ORAL_TABLET | Freq: Every day | ORAL | 1 refills | Status: DC

## 2022-08-04 MED ORDER — LURASIDONE HCL 20 MG PO TABS: 20.0000 mg | ORAL_TABLET | Freq: Every day | ORAL | 1 refills | Status: DC

## 2022-08-04 MED ORDER — VENLAFAXINE HCL ER 75 MG PO CP24: 225.0000 mg | ORAL_CAPSULE | Freq: Every day | ORAL | 1 refills | Status: DC

## 2022-08-04 NOTE — Telephone Encounter (Signed)
   Pre-operative Risk Assessment    Patient Name: Victoria [name]  DOB: 16-Dec-1955 MRN: [id number]      Request for Surgical Clearance    Procedure:   TAH, BSO, A&P RPR  ( HYSTERECTOMY ABDOMINAL WITH SALPINGO-OOPHORECTOMY Bilateral General  ANTERIOR AND POSTERIOR REPAIR)      Date of Surgery:  Clearance 08/15/22                                 Surgeon:  DR. Dian Queen Surgeon's Group or Practice Name:  [facility] Phone number:  [phone] Fax number:  [phone]   Type of Clearance Requested:   - Medical ; ASA    Type of Anesthesia:  General    Additional requests/questions:    Jiles Prows   08/04/2022, 2:28 PM

## 2022-08-04 NOTE — Progress Notes (Signed)
BH MD/PA/NP OP Progress Note  08/04/2022 12:50 PM [facility]  MRN:  [id number]  Visit Diagnosis:    ICD-10-CM   1. Anxiety and depression  F41.9    F32.A     2. OSA (obstructive sleep apnea)  G47.33       Assessment: Victoria [name] is a 67 y.o. female with a history of anxiety, depression, OSA who presented to [facility] for initial evaluation on 06/30/2022.  At initial evaluation patient reported symptoms of anxiety including feeling nervous or on edge, being unable to stop controlled worrying, worrying too much about different things, and fear that something awful would happen.  Patient's symptoms have been gradually progressing.  There was a period in November 2023 where she was having passive SI without intent or plan.  Patient also did endorse low mood, poor sleep, difficulty concentrating.  She does have a history of obstructive sleep apnea and does not use CPAP with last sleep study being over 10 years ago. Psychosocially patient has increased stress of caring for her adult son with cerebral palsy and planning the next steps for him after she and her husband pass or are no longer able to care for him.  Patient met criteria for MDD and generalized anxiety disorder. Of note on exam patient was found to have smacking of her lips repetitive movements of her mouth which are concerning for parkinsonism secondary to Zyprexa.    [name] presents for follow-up evaluation. Today, 08/04/22, patient reports worsening of her depression over the past month with an increase in her suicidal ideation and a decrease in her motivation/energy levels. Patient had called twice in the interim reporting a decline in her mood and during those occasions Zyprexa was discontinued followed by Remeron as she had been reporting an increase in her depression during the mornings which gradually got better over the course of her days. Effexor had also been increased to 187.5 mg on  08/01/22.  Over the past 2 days since last meeting with the patient she reports 1 good day with improved mood followed by day with depressed mood but no suicidality.  We will continue to titrate Effexor to 225 mg, start trazodone 50 to 100 mg at bedtime, and start Latuda 20 mg with dinner.  Risk and benefits of these medications were discussed.  We also discontinued the mirtazapine and the olanzapine which had been discussed in the interim.  [place] will also increase therapy to once a week and will start attending weekly groups through the [facility].  Plan: - Increase Effexor XR 225 mg QD - Start Trazodone 50-100 mg QHS - Start Latuda 20 mg w/dinner - Discontinue mirtazapine 7.5 mg QHS - Discontinue Olanzapine 2.5 mg QHS - Start Klonopin 0.5 mg QD prn for anxiety with plan to taper in future - Recommend repeat sleep study, could consider dental devices OSA still present - CMP, CBC, lipid profile, Vit D, A1c, TSH reviewed - Continue with therapy every week through [facility] - Joining a 10 week wellness group through the [facility], later will have a group for special needs parents - Crisis resources reviewed - Follow up in a month    Chief Complaint:  Chief Complaint  Patient presents with   Follow-up   HPI: Patient had called twice in the interim reporting a decline in her mood and during those occasions Zyprexa was discontinued followed by Remeron as she had been reporting an increase in her depression during the  mornings which gradually got better over the course of her days. Effexor had also been increased to 187.5 mg on 08/01/22.  On presentation today Victoria [name] reports that she did well yesterday waking up in a good mood despite not sleeping very well.  The elevated mood lasted throughout the day.  She did not sleep well last night and today was back to a depressed mood.  She denies having any suicidal ideation today however.  Patient feels like her mood has went from depressed 1 month ago to  debilitatingly depressed currently.  We explored reasons for the change in symptoms and whether the Zyprexa have been having more effective than initially thought.  Patient was still noted to be having mouth smacking and lip moving behaviors however believes it has decreased since decreasing the Zyprexa.  We discussed the effects of antipsychotics as an adjunct for mood stabilization and decided to start Latuda in addition to continue to titrate Effexor.  Risks and benefits of Latuda were discussed.  We also opted to hold off on restarting Remeron as patient had 1 day of improved mood and with increasing the dose the sedative effects would become less prominent potentially losing its benefit for sleep.  We will start trazodone 50 to 100 mg instead and went over the risk and benefits of this medication.  Will plan to follow-up with the patient next week prior to her surgery in 10 days.  Past Psychiatric History: No prior psychiatric hospitalizations. No prior suicide attempts.  Patient did endorse passive SI in November of 2023 without any intent or plan.  She denies being connected with a psychiatrist in the past and had recently connected with a therapist at [facility] therapy.  Has taken Effexor, Cymbalta, Ativan, Xanax, gabapentin, BuSpar, Zyprexa.  Denies substance use other than 1 beer a day. Can increase to 2 when the weather is nice.  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 LHC 6/16:  oOM1 60, pRCA 25 >> med Rx b. cath 3/19 2nd OM with 95% stenosis s/p synergy DES & anomalous RCA   CAP (community acquired pneumonia) 2016; 06/07/2016   Chronic bronchitis (HCC)    Chronic lower back pain    Complication of anesthesia    "think I have a hard time waking up from it"   COPD (chronic  obstructive pulmonary disease) ([place])    Depression    mild   Diverticulitis    Diverticulosis    GERD (gastroesophageal reflux disease)    H/O hiatal hernia    Headache    "weekly" (08/23/2017)   History of echocardiogram    Echo 6/16:  Mod LVH, EF 60-65%, no RWMA, Gr 1 DD, trivial MR, normal LA size.   Hyperglycemia 11/20/2015   Hyperlipidemia, mixed 09/11/2007   Qualifier: Diagnosis of  By: Jerold Coombe   Did not tolerate Lipitor, zocor, Lovastatin, Pravastatin, Livalo, Crestor even low dose    IBS (irritable bowel syndrome)    Maxillary sinusitis    Normal cardiac stress test 11/2011   No evidence of ischemia or infarct.   Calculated ejection fraction 72%.   Obesity    OSA (obstructive sleep apnea) 02/2012   has stopped using  cpap   Osteoarthritis    Osteoporosis  Pneumonia 11/2011   "before 2013 I hadn't had pneumonia since I was a child" (04/13/2012)   Pulmonary nodules    S/P angioplasty with stent 08/23/17 ostial 2nd OM with DES synnergy 08/24/2017   Schatzki's ring     Past Surgical History:  Procedure Laterality Date   APPENDECTOMY  1989   CARDIAC CATHETERIZATION N/A 11/25/2014   Procedure: Right/Left Heart Cath and Coronary Angiography;  Surgeon: Belva Crome, MD;  Location: [facility];  Service: Cardiovascular;  Laterality: N/A;   [place]   COLONOSCOPY  03/2022   CORONARY ANGIOPLASTY WITH STENT PLACEMENT  08/23/2017   CORONARY STENT INTERVENTION N/A 08/23/2017   Procedure: CORONARY STENT INTERVENTION;  Surgeon: Burnell Blanks, MD;  Location: [facility];  Service: Cardiovascular;  Laterality: N/A;   HERNIA REPAIR  04/13/2012   VHR laparoscopic   LEFT HEART CATH AND CORONARY ANGIOGRAPHY N/A 08/23/2017   Procedure: LEFT HEART CATH AND CORONARY ANGIOGRAPHY;  Surgeon: Burnell Blanks, MD;  Location: [facility];  Service: Cardiovascular;  Laterality: N/A;   VENTRAL HERNIA REPAIR  04/13/2012   Procedure: LAPAROSCOPIC  VENTRAL HERNIA;  Surgeon: Adin Hector, MD;  Location: [facility];  Service: General;  Laterality: N/A;  laparoscopic repair of incarcerated hernia    Family History:  Family History  Problem Relation Age of Onset   Breast cancer Mother    Hypertension Mother    Diabetes Mother    Cancer Mother        recurrent, metastatic breast cancer.    Diverticulosis Father    Prostate cancer Father    Breast cancer Sister    Cancer Sister        breast cancer, invasive ductal carcinoma in 2022,DCIS at 63 with 4 weeks of radiation, 5 years of Tamoxifen    Pulmonary embolism Brother        recurrent   Heart attack Maternal Grandfather    Cerebral palsy Son    Cancer Niece 51   Osteoporosis Niece    Stroke Neg Hx    Colon cancer Neg Hx    Esophageal cancer Neg Hx    Stomach cancer Neg Hx    Rectal cancer Neg Hx     Social History:  Social History   Socioeconomic History   Marital status: Married    Spouse name: Not on file   Number of children: 1   Years of education: 14   Highest education level: Associate degree: academic program  Occupational History   Occupation: Disabled   Tobacco Use   Smoking status: Never    Passive exposure: Never   Smokeless tobacco: Never  Vaping Use   Vaping Use: Never used  Substance and Sexual Activity   Alcohol use: Yes    Comment: social use   Drug use: No   Sexual activity: Yes    Comment: gluten free, lives with husband and son with CP quadriplegia  Other Topics Concern   Not on file  Social History Narrative   Cares for son with cerebral palsy.    Lives at home with her husband and son.   Right-handed.   2 cups caffeine per day.   One story home   Social Determinants of Health   Financial Resource Strain: Low Risk  (10/19/2021)   Overall Financial Resource Strain (CARDIA)    Difficulty of Paying Living Expenses: Not hard at all  Food Insecurity: No Food Insecurity (10/19/2021)   Hunger Vital Sign  Worried About Charity fundraiser  in the Last Year: Never true    [place] in the Last Year: Never true  Transportation Needs: No Transportation Needs (10/19/2021)   PRAPARE - Hydrologist (Medical): No    Lack of Transportation (Non-Medical): No  Physical Activity: Insufficiently Active (10/19/2021)   Exercise Vital Sign    Days of Exercise per Week: 7 days    Minutes of Exercise per Session: 20 min  Stress: Stress Concern Present (10/19/2021)   [place]    Feeling of Stress : Rather much  Social Connections: Moderately Integrated (10/19/2021)   Social Connection and Isolation Panel [NHANES]    Frequency of Communication with Friends and Family: More than three times a week    Frequency of Social Gatherings with Friends and Family: Once a week    Attends Religious Services: More than 4 times per year    Active Member of [employer] or Organizations: No    Attends Archivist Meetings: Never    Marital Status: Married    Allergies:  Allergies  Allergen Reactions   Beclomethasone Dipropionate Hives and Other (See Comments)     weight gain   Flexeril [Cyclobenzaprine] Anxiety   Mometasone Furo-Formoterol Fum Hives and Other (See Comments)    weight gain   Sulfonamide Derivatives Hives and Rash   Statins     Myalgias, RLS    Current Medications: Current Outpatient Medications  Medication Sig Dispense Refill   albuterol (VENTOLIN HFA) 108 (90 Base) MCG/ACT inhaler Inhale 1-2 puffs into the lungs every 6 (six) hours as needed. 8 g 2   aspirin 81 MG tablet Take 81 mg by mouth daily.     Benralizumab (FASENRA PEN) 30 MG/ML SOAJ Inject 1 mL (30 mg total) into the skin every 8 (eight) weeks. 1 mL 2   Bioflavonoid Products (ESTER C PO) Take 500 mg by mouth daily.     budesonide (ENTOCORT EC) 3 MG 24 hr capsule Take 3 caps a day for 4 weeks then take 2 caps daily for 4 weeks then take 1 capsule daily for 4 weeks. (Patient  taking differently: Take 3 mg by mouth daily.) 222 capsule 0   Calcium-Magnesium-Vitamin D (CALCIUM MAGNESIUM PO) Take 1 tablet by mouth daily.     clonazePAM (KLONOPIN) 0.5 MG tablet TAKE 1 TABLET (0.5 MG TOTAL) BY MOUTH DAILY AS NEEDED FOR ANXIETY. (Patient taking differently: Take 0.5 mg by mouth daily.) 30 tablet 0   Coenzyme Q10 (CO Q 10 PO) Take 1 capsule by mouth daily.     Evolocumab (REPATHA SURECLICK) [id number] MG/ML SOAJ INJECT 1 PEN INTO THE SKIN EVERY 14 DAYS (Patient taking differently: Inject 140 mg into the skin every 21 ( twenty-one) days.) 6 mL 3   famotidine (PEPCID) 20 MG tablet TAKE 1 TABLET BY MOUTH TWICE A DAY 60 tablet 2   HYDROcodone bit-homatropine (HYCODAN) 5-1.5 MG/5ML syrup Take 5 mLs by mouth every 8 (eight) hours as needed for cough. (Patient not taking: Reported on 07/29/2022) 180 mL 0   ipratropium-albuterol (DUONEB) 0.5-2.5 (3) MG/3ML SOLN Take 3 mLs by nebulization every 6 (six) hours as needed. 120 mL 5   LORazepam (ATIVAN) 1 MG tablet TAKE 1/2 TO 1 TABLET BY MOUTH 2 TIMES A DAY AS NEEDED FOR ANXIETY OR SLEEP (Patient not taking: Reported on 07/29/2022) 30 tablet 2   nitroGLYCERIN (NITROSTAT) 0.4 MG SL tablet PLACE 1  TABLET UNDER THE TONGUE EVERY 5 MINUTES AS NEEDED FOR CHEST PAIN. 25 tablet 8   OLANZapine (ZYPREXA) 5 MG tablet TAKE 1 TABLET (5 MG TOTAL) BY MOUTH AT BEDTIME. (Patient not taking: Reported on 07/29/2022) 30 tablet 1   oseltamivir (TAMIFLU) 75 MG capsule Take 1 capsule (75 mg total) by mouth 2 (two) times daily. (Patient not taking: Reported on 07/29/2022) 10 capsule 0   predniSONE (STERAPRED UNI-PAK 21 TAB) 10 MG (21) TBPK tablet Take as directed (Patient not taking: Reported on 07/29/2022) 21 tablet 0   PROLIA 60 MG/ML SOSY injection INJECT 60 MG INTO THE SKIN ONCE FOR 1 DOSE. 1 mL 0   venlafaxine XR (EFFEXOR XR) 37.5 MG 24 hr capsule Take 1 capsule (37.5 mg total) by mouth daily. Take with 150 mg for a total of 187.5 mg daily 30 capsule 0   venlafaxine XR (EFFEXOR  XR) 75 MG 24 hr capsule Take 3 capsules (225 mg total) by mouth daily with breakfast. (Patient taking differently: Take 150 mg by mouth daily with breakfast.) 90 capsule 3   VITAMIN D PO Take 5,000 Units by mouth daily.     No current facility-administered medications for this visit.     Psychiatric Specialty Exam: Review of Systems  There were no vitals taken for this visit.There is no height or weight on file to calculate BMI.  General Appearance: Disheveled  Eye Contact:  Good  Speech:  Clear and Coherent and Slow  Volume:  Normal  Mood:  Depressed and Dysphoric  Affect:  Congruent  Thought Process:  Coherent and Goal Directed  Orientation:  Full (Time, Place, and Person)  Thought Content: Logical   Suicidal Thoughts:  No  Homicidal Thoughts:  No  Memory:  NA  Judgement:  Fair  Insight:  Fair  Psychomotor Activity:  Normal  Concentration:  Concentration: Good  Recall:  [place] of Knowledge: Fair  Language: Good  Akathisia:  NA    AIMS (if indicated): not done  Assets:  Communication Skills Desire for Improvement Housing Intimacy Social Support Transportation Vocational/Educational  ADL's:  Intact  Cognition: WNL  Sleep:  Poor   Metabolic Disorder Labs: Lab Results  Component Value Date   HGBA1C 5.6 05/26/2022   MPG 117 10/22/2008   No results found for: "PROLACTIN" Lab Results  Component Value Date   CHOL 208 (H) 05/26/2022   TRIG 113.0 05/26/2022   HDL 74.00 05/26/2022   CHOLHDL 3 05/26/2022   VLDL 22.6 05/26/2022   LDLCALC 111 (H) 05/26/2022   LDLCALC 95 07/06/2021   Lab Results  Component Value Date   TSH 1.43 05/26/2022   TSH 1.82 07/06/2021    Therapeutic Level Labs: No results found for: "LITHIUM" No results found for: "VALPROATE" No results found for: "CBMZ"   Screenings: GAD-7    Flowsheet Row Office Visit from 06/30/2022 in [facility] Office Visit from 05/26/2022 in [facility]  Primary [facility] at [place] Visit from 01/16/2017 in [place] at [facility]  Total GAD-7 Score 11 11 3      $ [place] Office Visit from 06/30/2022 in [facility] Office Visit from [facility]26/2022 in [facility] at [facility] Video Visit from 11/25/2021 in [facility] Care at [place] from 10/19/2021 in [facility] at [place] Visit from 03/04/2021 in [facility] at [facility]  PHQ-2 Total Score 3 3 0 0 2  PHQ-9 Total Score 4 4 4 $ -- 7      Flowsheet Row ED from 08/02/2022 in [facility] Most recent reading at 08/02/2022 12:53 PM Pre-Admission Testing 60 from 08/02/2022 in [facility] PREADMISSION TESTING Most recent reading at 08/02/2022 11:00 AM Office Visit from 06/30/2022 in [facility] Most recent reading at 06/30/2022  3:50 PM  C-SSRS RISK CATEGORY Low Risk Moderate Risk No Risk       Collaboration of Care: Collaboration of Care: Medication Management AEB medication prescription and Other provider involved in patient's care AEB [place] chart review  Patient/Guardian was advised Release of Information must be obtained prior to any record release in order to collaborate their care with an outside provider. Patient/Guardian was advised if they have not already done so to contact the registration department to sign all necessary forms in order for [name] to release information regarding their care.   Consent: Patient/Guardian gives verbal consent for treatment and assignment of benefits for services provided during this visit. Patient/Guardian expressed understanding and agreed to proceed.    Vista Mink, MD 08/04/2022, 12:50 PM   Virtual Visit via Video Note  I connected with Marya Fossa on  08/04/22 at  2:00 PM EST by a video enabled telemedicine application and verified that I am speaking with the correct person using two identifiers.  Location: Patient: Home Provider: Home Office   I discussed the limitations of evaluation and management by telemedicine and the availability of in person appointments. The patient expressed understanding and agreed to proceed.   I discussed the assessment and treatment plan with the patient. The patient was provided an opportunity to ask questions and all were answered. The patient agreed with the plan and demonstrated an understanding of the instructions.   The patient was advised to call back or seek an in-person evaluation if the symptoms worsen or if the condition fails to improve as anticipated.  I provided 25 minutes of non-face-to-face time during this encounter.   Vista Mink, MD

## 2022-08-05 NOTE — Telephone Encounter (Signed)
   Name: Victoria [name]  DOB: [birth year]  MRN: [id number]  Primary Cardiologist: Ena Dawley, MD   Preoperative team, please contact this patient and set up a phone call appointment for further preoperative risk assessment. Please obtain consent and complete medication review. Thank you for your help.  I confirm that guidance regarding antiplatelet and oral anticoagulation therapy has been completed and, if necessary, noted below.  May hold ASA if needed.   Ledora Bottcher, PA 08/05/2022, 10:46 AM [place]

## 2022-08-05 NOTE — Telephone Encounter (Signed)
I have left message to call back to set up tele preop appt

## 2022-08-08 ENCOUNTER — Other Ambulatory Visit (HOSPITAL_COMMUNITY): Payer: Self-pay | Admitting: Psychiatry

## 2022-08-08 ENCOUNTER — Telehealth: Payer: Self-pay | Admitting: *Deleted

## 2022-08-08 ENCOUNTER — Telehealth (HOSPITAL_COMMUNITY): Payer: Self-pay | Admitting: *Deleted

## 2022-08-08 NOTE — Telephone Encounter (Signed)
S/w the pt and she is agreeable to plan of care for tele pre op appt 08/11/22 @ 10 am. Med rec and consent are done.     Patient Consent for Virtual Visit        [name] has provided verbal consent on 08/08/2022 for a virtual visit (video or telephone).   CONSENT FOR VIRTUAL VISIT FOR:  Victoria [name]  By participating in this virtual visit I agree to the following:  I hereby voluntarily request, consent and authorize [facility] and its employed or contracted physicians, physician assistants, nurse practitioners or other licensed health care professionals (the Practitioner), to provide me with telemedicine health care services (the "Services") as deemed necessary by the treating Practitioner. I acknowledge and consent to receive the Services by the Practitioner via telemedicine. I understand that the telemedicine visit will involve communicating with the Practitioner through live audiovisual communication technology and the disclosure of certain medical information by electronic transmission. I acknowledge that I have been given the opportunity to request an in-person assessment or other available alternative prior to the telemedicine visit and am voluntarily participating in the telemedicine visit.  I understand that I have the right to withhold or withdraw my consent to the use of telemedicine in the course of my care at any time, without affecting my right to future care or treatment, and that the Practitioner or I may terminate the telemedicine visit at any time. I understand that I have the right to inspect all information obtained and/or recorded in the course of the telemedicine visit and may receive copies of available information for a reasonable fee.  I understand that some of the potential risks of receiving the Services via telemedicine include:  Delay or interruption in medical evaluation due to technological equipment failure or disruption; Information transmitted may  not be sufficient (e.g. poor resolution of images) to allow for appropriate medical decision making by the Practitioner; and/or  In rare instances, security protocols could fail, causing a breach of personal health information.  Furthermore, I acknowledge that it is my responsibility to provide information about my medical history, conditions and care that is complete and accurate to the best of my ability. I acknowledge that Practitioner's advice, recommendations, and/or decision may be based on factors not within their control, such as incomplete or inaccurate data provided by me or distortions of diagnostic images or specimens that may result from electronic transmissions. I understand that the practice of medicine is not an exact science and that Practitioner makes no warranties or guarantees regarding treatment outcomes. I acknowledge that a copy of this consent can be made available to me via my patient portal ([place]), or I can request a printed copy by calling the office of [facility].    I understand that my insurance will be billed for this visit.   I have read or had this consent read to me. I understand the contents of this consent, which adequately explains the benefits and risks of the Services being provided via telemedicine.  I have been provided ample opportunity to ask questions regarding this consent and the Services and have had my questions answered to my satisfaction. I give my informed consent for the services to be provided through the use of telemedicine in my medical care

## 2022-08-08 NOTE — Telephone Encounter (Signed)
S/w the pt and she is agreeable to plan of care for tele pre op appt 08/11/22 @ 10 am. Med rec and consent are done.

## 2022-08-08 NOTE — Telephone Encounter (Signed)
Patient returned call

## 2022-08-08 NOTE — Telephone Encounter (Signed)
Pt called with c/o "restlessness", constant, even impacting sleep, since starting Latuda 20 mg on 08/04/22. Pt also concerned about the Trazodone increasing bleeding time as she is having surgery next week. Pt did put a call in to surgeons office regarding this. Also pt is asking to restart the Ativan stating that this is what works for her @ HS without any s/e and she would feel more comfortable going into surgery taking what she knows works. Writer advised that medication was not prescribed by Dr. Nelida Gores but would pass along the message. Pt is scheduled for f/u appointment on 08/11/22. Please review and avise.

## 2022-08-11 ENCOUNTER — Telehealth (HOSPITAL_COMMUNITY): Payer: Self-pay | Admitting: Psychiatry

## 2022-08-11 ENCOUNTER — Encounter (HOSPITAL_COMMUNITY): Payer: Self-pay | Admitting: Psychiatry

## 2022-08-11 ENCOUNTER — Telehealth (HOSPITAL_BASED_OUTPATIENT_CLINIC_OR_DEPARTMENT_OTHER): Payer: PPO | Admitting: Psychiatry

## 2022-08-11 ENCOUNTER — Ambulatory Visit: Payer: PPO

## 2022-08-11 DIAGNOSIS — F419 Anxiety disorder, unspecified: Secondary | ICD-10-CM | POA: Diagnosis not present

## 2022-08-11 DIAGNOSIS — F32A Depression, unspecified: Secondary | ICD-10-CM

## 2022-08-11 DIAGNOSIS — G4733 Obstructive sleep apnea (adult) (pediatric): Secondary | ICD-10-CM | POA: Diagnosis not present

## 2022-08-11 MED ORDER — LORAZEPAM 1 MG PO TABS: 1.0000 mg | ORAL_TABLET | Freq: Every evening | ORAL | 0 refills | Status: DC | PRN

## 2022-08-11 NOTE — Telephone Encounter (Signed)
D:  Dr. Nelida Gores referred pt to [place].  A:  Placed call to orient pt and to inquire about her insurance.  Encouraged pt to contact her insurance to verify her benefits for [place] and to give [place] [place] Coordinator a call back.  Inform Dr. Nelida Gores.  R:  Pt receptive.

## 2022-08-11 NOTE — Progress Notes (Signed)
BH MD/PA/NP OP Progress Note  08/11/2022 10:02 AM RASHITA KUHLMAN  MRN:  [id number]  Visit Diagnosis:    ICD-10-CM   1. Anxiety and depression  F41.9    F32.A     2. OSA (obstructive sleep apnea)  G47.33       Assessment: Victoria [name] is a 67 y.o. female with a history of anxiety, depression, OSA who presented to [place] at [facility] for initial evaluation on 06/30/2022.  At initial evaluation patient reported symptoms of anxiety including feeling nervous or on edge, being unable to stop controlled worrying, worrying too much about different things, and fear that something awful would happen.  Patient's symptoms have been gradually progressing.  There was a period in November 2023 where she was having passive SI without intent or plan.  Patient also did endorse low mood, poor sleep, difficulty concentrating.  She does have a history of obstructive sleep apnea and does not use CPAP with last sleep study being over 10 years ago. Psychosocially patient has increased stress of caring for her adult son with cerebral palsy and planning the next steps for him after she and her husband pass or are no longer able to care for him.  Patient met criteria for MDD and generalized anxiety disorder. Of note on exam patient was found to have smacking of her lips repetitive movements of her mouth which are concerning for parkinsonism secondary to Zyprexa.    [name] presents for follow-up evaluation. Today, 08/11/22, patient reports continued anxiety and depression over the past week.  She had initially called in the interim due to excessive akathisia on Latuda leading to his discontinuing the medication.  At that time she however thought her mood symptoms had been improving.  Unfortunately patient reported a decline in her mood and increase in her anxiety along with an improvement in her akathisia symptoms after discontinuing Latuda.  She currently also feels like there might be some  signs of increased restlessness and fidgeting which she attributes to the increased Effexor.  It is possible that she is experiencing some signs of serotonin activation which can improve over time.  Patient was encouraged to continue on the medication for a full 6 weeks before tapering the dose.  Patient also feels that trazodone has not been as effective for sleep and would like to switch back to Ativan.  We explained that while this is an option we would have to discontinue Klonopin and she was in agreement.  Patient is aware of the risk and benefits of this.  We will also look into [place] as an option for this patient.  Plan: - Continue Effexor XR 225 mg QD - Restart Ativan 1 mg QHS - Discontinue Trazodone due to poor effect - Discontinue Latuda 20 mg due to akathisia - Discontinue Klonopin 0.5 mg  - Recommend repeat sleep study, could consider dental devices OSA still present - CMP, CBC, lipid profile, Vit D, A1c, TSH reviewed - Continue with therapy every week through [place] therapy - Joining a 10 week wellness group through the [facility], later will have a group for special needs parents - Crisis resources reviewed - Follow up in a month    Chief Complaint:  Chief Complaint  Patient presents with   Follow-up   Depression   HPI: Patient had called in the interim after starting Latuda reporting that she had developed increased restlessness.  She was discontinued at that time and [place] noted today that the restlessness  she had experienced with the had resolved after stopping it.  She does still report that she has been a bit more fidgety lately and rocks back and forth during the days and wonders if it could be secondary due to the increased Effexor.  We explained that it is possible that serotonin activation can presents similarly to this and Effexor is a stimulating antidepressant.  However it was also recommended that we continue the medication for a longer period of time as your body can  acclimate to this over time and it is unclear after 1 week whether the increase in Effexor would be beneficial for her mood or not.  Patient was agreeable to doing this.  While she had reported a few days ago that she thought that her mood had improved some, over the last few days now she felt there was a decline again.  She notes significant anxiety upon waking up with depression throughout the day.  It can progressed to suicidal ideation when she has thoughts about not getting better.  Patient did decide to cancel her hysterectomy after discussing with her husband as it was an elective procedure with her current mental health status she felt that he would not be beneficial to go forward with it.  She does not report any improvement in anxiety after canceling the procedure.  This patient continues to struggle with significant anxiety and depressive symptoms with minimal improvement we discussed options going forward today.  Due to experiencing significant side effects from medications it was decided to do gene site testing before starting Abilify or retrying mirtazapine.  We also discussed TMS testing and patient is going to check with her insurance to see if it is an option.  Patient also expressed desire to restart Ativan for sleep as she felt the trazodone was making her nauseous and with less effective.  Patient notes that she was sleeping from around 10 or 11 PM until 6 in the morning which is an hour earlier that she would typically get up.  We did agree to discontinue Klonopin and restart Ativan, but also been explained that the amount of sleep we need does decline as we get older.  We also explained that taking Ativan regularly at night can result in long-term adverse effects on memory, balance, and breathing in addition to the risk of developing tolerance.  Past Psychiatric History: No prior psychiatric hospitalizations. No prior suicide attempts.  Patient did endorse passive SI in November of 2023  without any intent or plan.  She denies being connected with a psychiatrist in the past and had recently connected with a therapist at [facility] therapy.  Has taken Effexor (possible restlessness at 225), Cymbalta, Remeron, Ativan, Xanax, Klonopin, gabapentin, BuSpar, Zyprexa (TD), Latuda (akathisia)  Denies substance use other than 1 beer a day. Can increase to 2 when the weather is nice.  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 LHC 6/16:  oOM1 60, pRCA 25 >> med Rx b. cath 3/19 2nd OM with 95% stenosis s/p synergy DES & anomalous RCA   CAP (community acquired pneumonia) 2016; 06/07/2016   Chronic bronchitis ([place])    Chronic lower back pain    Complication of anesthesia    "think I have a hard  time waking up from it"   COPD (chronic obstructive pulmonary disease) ([place])    Depression    mild   Diverticulitis    Diverticulosis    GERD (gastroesophageal reflux disease)    H/O hiatal hernia    Headache    "weekly" (08/23/2017)   History of echocardiogram    Echo 6/16:  Mod LVH, EF 60-65%, no RWMA, Gr 1 DD, trivial MR, normal LA size.   Hyperglycemia 11/20/2015   Hyperlipidemia, mixed 09/11/2007   Qualifier: Diagnosis of  By: Jerold Coombe   Did not tolerate Lipitor, zocor, Lovastatin, Pravastatin, Livalo, Crestor even low dose    IBS (irritable bowel syndrome)    Maxillary sinusitis    Normal cardiac stress test 11/2011   No evidence of ischemia or infarct.   Calculated ejection fraction 72%.   Obesity    OSA (obstructive sleep apnea) 02/2012   has stopped using  cpap   Osteoarthritis    Osteoporosis    Pneumonia 11/2011   "before 2013 I hadn't had pneumonia since I was a child" (04/13/2012)   Pulmonary nodules    S/P angioplasty with stent 08/23/17 ostial 2nd OM with DES  synnergy 08/24/2017   Schatzki's ring     Past Surgical History:  Procedure Laterality Date   APPENDECTOMY  1989   CARDIAC CATHETERIZATION N/A 11/25/2014   Procedure: Right/Left Heart Cath and Coronary Angiography;  Surgeon: Belva Crome, MD;  Location: [facility];  Service: Cardiovascular;  Laterality: N/A;   [place]   COLONOSCOPY  03/2022   CORONARY ANGIOPLASTY WITH STENT PLACEMENT  08/23/2017   CORONARY STENT INTERVENTION N/A 08/23/2017   Procedure: CORONARY STENT INTERVENTION;  Surgeon: Burnell Blanks, MD;  Location: [facility];  Service: Cardiovascular;  Laterality: N/A;   HERNIA REPAIR  04/13/2012   VHR laparoscopic   LEFT HEART CATH AND CORONARY ANGIOGRAPHY N/A 08/23/2017   Procedure: LEFT HEART CATH AND CORONARY ANGIOGRAPHY;  Surgeon: Burnell Blanks, MD;  Location: [facility];  Service: Cardiovascular;  Laterality: N/A;   VENTRAL HERNIA REPAIR  04/13/2012   Procedure: LAPAROSCOPIC VENTRAL HERNIA;  Surgeon: Adin Hector, MD;  Location: [facility];  Service: General;  Laterality: N/A;  laparoscopic repair of incarcerated hernia    Family History:  Family History  Problem Relation Age of Onset   Breast cancer Mother    Hypertension Mother    Diabetes Mother    Cancer Mother        recurrent, metastatic breast cancer.    Diverticulosis Father    Prostate cancer Father    Breast cancer Sister    Cancer Sister        breast cancer, invasive ductal carcinoma in 2022,DCIS at 61 with 4 weeks of radiation, 5 years of Tamoxifen    Pulmonary embolism Brother        recurrent   Heart attack Maternal Grandfather    Cerebral palsy Son    Cancer Niece 56   Osteoporosis Niece    Stroke Neg Hx    Colon cancer Neg Hx    Esophageal cancer Neg Hx    Stomach cancer Neg Hx    Rectal cancer Neg Hx     Social History:  Social History   Socioeconomic History   Marital status: Married    Spouse name: Not on file   Number of  children: 1   Years of education: 14   Highest education level: Associate degree:  academic program  Occupational History   Occupation: Disabled   Tobacco Use   Smoking status: Never    Passive exposure: Never   Smokeless tobacco: Never  Vaping Use   Vaping Use: Never used  Substance and Sexual Activity   Alcohol use: Yes    Comment: social use   Drug use: No   Sexual activity: Yes    Comment: gluten free, lives with husband and son with CP quadriplegia  Other Topics Concern   Not on file  Social History Narrative   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    Lives at home with her husband and son.   Right-handed.   2 cups caffeine per day.   One story home   Social Determinants of Health   Financial Resource Strain: Low Risk  (10/19/2021)   Overall Financial Resource Strain (CARDIA)    Difficulty of Paying Living Expenses: Not hard at all  Food Insecurity: No Food Insecurity (10/19/2021)   Hunger Vital Sign    Worried About Running Out of Food in the Last Year: Never true    Ran Out of Food in the Last Year: Never true  Transportation Needs: No Transportation Needs (10/19/2021)   PRAPARE - Hydrologist (Medical): No    Lack of Transportation (Non-Medical): No  Physical Activity: Insufficiently Active (10/19/2021)   Exercise Vital Sign    Days of Exercise per Week: 7 days    Minutes of Exercise per Session: 20 min  Stress: Stress Concern Present (10/19/2021)   [place]    Feeling of Stress : Rather much  Social Connections: Moderately Integrated (10/19/2021)   Social Connection and Isolation Panel [NHANES]    Frequency of Communication with Friends and Family: More than three times a week    Frequency of Social Gatherings with Friends and Family: Once a week    Attends Religious Services: More than 4 times per year    Active Member of [employer] or Organizations: No    Attends Theatre manager Meetings: Never    Marital Status: Married    Allergies:  Allergies  Allergen Reactions   Latuda [Lurasidone Hcl] Other (See Comments)    PER THE PT CAUSED RESTLESSNESS   Beclomethasone Dipropionate Hives and Other (See Comments)     weight gain   Flexeril [Cyclobenzaprine] Anxiety   Mometasone Furo-Formoterol Fum Hives and Other (See Comments)    weight gain   Sulfonamide Derivatives Hives and Rash   Statins     Myalgias, RLS    Current Medications: Current Outpatient Medications  Medication Sig Dispense Refill   albuterol (VENTOLIN HFA) 108 (90 Base) MCG/ACT inhaler Inhale 1-2 puffs into the lungs every 6 (six) hours as needed. 8 g 2   aspirin 81 MG tablet Take 81 mg by mouth daily.     Benralizumab (FASENRA PEN) 30 MG/ML SOAJ Inject 1 mL (30 mg total) into the skin every 8 (eight) weeks. 1 mL 2   Bioflavonoid Products (ESTER C PO) Take 500 mg by mouth daily.     budesonide (ENTOCORT EC) 3 MG 24 hr capsule Take 3 caps a day for 4 weeks then take 2 caps daily for 4 weeks then take 1 capsule daily for 4 weeks. (Patient taking differently: Take 3 mg by mouth daily.) 222 capsule 0   Calcium-Magnesium-Vitamin D (CALCIUM MAGNESIUM PO) Take 1 tablet by mouth daily.     [START ON 08/23/2022] clonazePAM (  KLONOPIN) 0.5 MG tablet Take 1 tablet (0.5 mg total) by mouth daily. 30 tablet 0   Coenzyme Q10 (CO Q 10 PO) Take 1 capsule by mouth daily. (Patient not taking: Reported on 08/08/2022)     Evolocumab (REPATHA SURECLICK) [id number] MG/ML SOAJ INJECT 1 PEN INTO THE SKIN EVERY 14 DAYS (Patient taking differently: Inject 140 mg into the skin every 21 ( twenty-one) days.) 6 mL 3   famotidine (PEPCID) 20 MG tablet TAKE 1 TABLET BY MOUTH TWICE A DAY 60 tablet 2   HYDROcodone bit-homatropine (HYCODAN) 5-1.5 MG/5ML syrup Take 5 mLs by mouth every 8 (eight) hours as needed for cough. (Patient not taking: Reported on 07/29/2022) 180 mL 0   ipratropium-albuterol (DUONEB) 0.5-2.5 (3) MG/3ML SOLN Take 3  mLs by nebulization every 6 (six) hours as needed. 120 mL 5   LORazepam (ATIVAN) 1 MG tablet TAKE 1/2 TO 1 TABLET BY MOUTH 2 TIMES A DAY AS NEEDED FOR ANXIETY OR SLEEP (Patient not taking: Reported on 07/29/2022) 30 tablet 2   lurasidone (LATUDA) 20 MG TABS tablet Take 1 tablet (20 mg total) by mouth daily with supper. (Patient not taking: Reported on 08/08/2022) 30 tablet 1   nitroGLYCERIN (NITROSTAT) 0.4 MG SL tablet PLACE 1 TABLET UNDER THE TONGUE EVERY 5 MINUTES AS NEEDED FOR CHEST PAIN. (Patient not taking: Reported on 08/08/2022) 25 tablet 8   oseltamivir (TAMIFLU) 75 MG capsule Take 1 capsule (75 mg total) by mouth 2 (two) times daily. (Patient not taking: Reported on 07/29/2022) 10 capsule 0   predniSONE (STERAPRED UNI-PAK 21 TAB) 10 MG (21) TBPK tablet Take as directed (Patient not taking: Reported on 07/29/2022) 21 tablet 0   PROLIA 60 MG/ML SOSY injection INJECT 60 MG INTO THE SKIN ONCE FOR 1 DOSE. 1 mL 0   traZODone (DESYREL) 50 MG tablet Take 1-2 tablets (50-100 mg total) by mouth at bedtime. 60 tablet 1   venlafaxine XR (EFFEXOR XR) 75 MG 24 hr capsule Take 3 capsules (225 mg total) by mouth daily with breakfast. 90 capsule 1   VITAMIN D PO Take 5,000 Units by mouth daily.     No current facility-administered medications for this visit.     Psychiatric Specialty Exam: Review of Systems  There were no vitals taken for this visit.There is no height or weight on file to calculate BMI.  General Appearance: Disheveled  Eye Contact:  Good  Speech:  Clear and Coherent and Slow  Volume:  Normal  Mood:  Depressed and Dysphoric  Affect:  Congruent  Thought Process:  Coherent and Goal Directed  Orientation:  Full (Time, Place, and Person)  Thought Content: Logical   Suicidal Thoughts:  No  Homicidal Thoughts:  No  Memory:  NA  Judgement:  Fair  Insight:  Fair  Psychomotor Activity:  Normal  Concentration:  Concentration: Good  Recall:  [place] of Knowledge: Fair  Language: Good   Akathisia:  NA    AIMS (if indicated): not done  Assets:  Communication Skills Desire for Improvement Housing Intimacy Social Support Transportation Vocational/Educational  ADL's:  Intact  Cognition: WNL  Sleep:  Poor   Metabolic Disorder Labs: Lab Results  Component Value Date   HGBA1C 5.6 05/26/2022   MPG 117 10/22/2008   No results found for: "PROLACTIN" Lab Results  Component Value Date   CHOL 208 (H) 05/26/2022   TRIG 113.0 05/26/2022   HDL 74.00 05/26/2022   CHOLHDL 3 05/26/2022   VLDL 22.6 05/26/2022   [place]  111 (H) 05/26/2022   [place] 95 07/06/2021   Lab Results  Component Value Date   TSH 1.43 05/26/2022   TSH 1.82 07/06/2021    Therapeutic Level Labs: No results found for: "LITHIUM" No results found for: "VALPROATE" No results found for: "CBMZ"   Screenings: [place] Office Visit from 06/30/2022 in [facility] Office Visit from 05/26/2022 in [facility] [place] Visit from 01/16/2017 in [place] at [facility]  Total GAD-7 Score 11 11 3      $ [place] Office Visit from 06/30/2022 in [facility] Office Visit from 05/26/2022 in [facility] at [facility] Video Visit from 11/25/2021 in [facility] at [place] from 10/19/2021 in [facility] at [place] Visit from 03/04/2021 in [facility] at [facility]  PHQ-2 Total Score 3 3 0 0 2  PHQ-9 Total Score 4 4 4 $ -- 7      [place] [facility] from 08/02/2022 in [facility] Most recent reading at 08/02/2022 12:53 PM Pre-Admission Testing 60 from 08/02/2022 in [facility] PREADMISSION TESTING Most recent reading at 08/02/2022 11:00 AM Office Visit from 06/30/2022 in  [facility] Most recent reading at 06/30/2022  3:50 PM  C-SSRS RISK CATEGORY Low Risk Moderate Risk No Risk       Collaboration of Care: Collaboration of Care: Medication Management AEB medication prescription and Other provider involved in patient's care AEB [place] chart review  Patient/Guardian was advised Release of Information must be obtained prior to any record release in order to collaborate their care with an outside provider. Patient/Guardian was advised if they have not already done so to contact the registration department to sign all necessary forms in order for [name] to release information regarding their care.   Consent: Patient/Guardian gives verbal consent for treatment and assignment of benefits for services provided during this visit. Patient/Guardian expressed understanding and agreed to proceed.    Vista Mink, MD 08/11/2022, 10:02 AM   Virtual Visit via Video Note  I connected with Marya Fossa on 08/11/22 at  1:00 PM EST by a video enabled telemedicine application and verified that I am speaking with the correct person using two identifiers.  Location: Patient: Home Provider: Home Office   I discussed the limitations of evaluation and management by telemedicine and the availability of in person appointments. The patient expressed understanding and agreed to proceed.   I discussed the assessment and treatment plan with the patient. The patient was provided an opportunity to ask questions and all were answered. The patient agreed with the plan and demonstrated an understanding of the instructions.   The patient was advised to call back or seek an in-person evaluation if the symptoms worsen or if the condition fails to improve as anticipated.  I provided 30 minutes of non-face-to-face time during this encounter.   Vista Mink, MD

## 2022-08-12 ENCOUNTER — Encounter: Payer: Self-pay | Admitting: Family

## 2022-08-12 ENCOUNTER — Ambulatory Visit (HOSPITAL_COMMUNITY)
Admission: EM | Admit: 2022-08-12 | Discharge: 2022-08-12 | Disposition: A | Discharge status: Other Acute Inpatient Hospital | Payer: PPO | Attending: Family | Admitting: Family

## 2022-08-12 ENCOUNTER — Other Ambulatory Visit: Payer: Self-pay

## 2022-08-12 ENCOUNTER — Inpatient Hospital Stay
Admission: AD | Admit: 2022-08-12 | Discharge: 2022-08-18 | DRG: 885 | Disposition: A | Discharge status: Home / Self Care | Payer: PPO | Source: Intra-hospital | Attending: Psychiatry | Admitting: Psychiatry

## 2022-08-12 ENCOUNTER — Telehealth (HOSPITAL_COMMUNITY): Payer: Self-pay

## 2022-08-12 DIAGNOSIS — F332 Major depressive disorder, recurrent severe without psychotic features: Secondary | ICD-10-CM | POA: Diagnosis not present

## 2022-08-12 DIAGNOSIS — J4489 Other specified chronic obstructive pulmonary disease: Secondary | ICD-10-CM | POA: Diagnosis not present

## 2022-08-12 DIAGNOSIS — Z1152 Encounter for screening for COVID-19: Secondary | ICD-10-CM | POA: Diagnosis not present

## 2022-08-12 DIAGNOSIS — F419 Anxiety disorder, unspecified: Secondary | ICD-10-CM

## 2022-08-12 DIAGNOSIS — G4733 Obstructive sleep apnea (adult) (pediatric): Secondary | ICD-10-CM | POA: Diagnosis present

## 2022-08-12 DIAGNOSIS — R000 Tachycardia, unspecified: Secondary | ICD-10-CM | POA: Diagnosis not present

## 2022-08-12 DIAGNOSIS — M810 Age-related osteoporosis without current pathological fracture: Secondary | ICD-10-CM | POA: Diagnosis not present

## 2022-08-12 DIAGNOSIS — R45851 Suicidal ideations: Secondary | ICD-10-CM | POA: Insufficient documentation

## 2022-08-12 DIAGNOSIS — Z20822 Contact with and (suspected) exposure to covid-19: Secondary | ICD-10-CM | POA: Diagnosis present

## 2022-08-12 DIAGNOSIS — F411 Generalized anxiety disorder: Secondary | ICD-10-CM | POA: Diagnosis present

## 2022-08-12 DIAGNOSIS — F32A Depression, unspecified: Secondary | ICD-10-CM | POA: Diagnosis not present

## 2022-08-12 DIAGNOSIS — Z79899 Other long term (current) drug therapy: Secondary | ICD-10-CM

## 2022-08-12 DIAGNOSIS — M419 Scoliosis, unspecified: Secondary | ICD-10-CM | POA: Diagnosis not present

## 2022-08-12 DIAGNOSIS — Z7982 Long term (current) use of aspirin: Secondary | ICD-10-CM | POA: Diagnosis not present

## 2022-08-12 DIAGNOSIS — Z955 Presence of coronary angioplasty implant and graft: Secondary | ICD-10-CM

## 2022-08-12 DIAGNOSIS — Z636 Dependent relative needing care at home: Secondary | ICD-10-CM | POA: Insufficient documentation

## 2022-08-12 DIAGNOSIS — G4700 Insomnia, unspecified: Secondary | ICD-10-CM | POA: Diagnosis not present

## 2022-08-12 DIAGNOSIS — Z8701 Personal history of pneumonia (recurrent): Secondary | ICD-10-CM

## 2022-08-12 DIAGNOSIS — I2510 Atherosclerotic heart disease of native coronary artery without angina pectoris: Secondary | ICD-10-CM | POA: Diagnosis present

## 2022-08-12 LAB — TSH: TSH: 1.788 u[IU]/mL (ref 0.350–4.500)

## 2022-08-12 LAB — CBC WITH DIFFERENTIAL/PLATELET
Abs Immature Granulocytes: 0.04 10*3/uL (ref 0.00–0.07)
Basophils Absolute: 0 10*3/uL (ref 0.0–0.1)
Basophils Relative: 0 %
Eosinophils Absolute: 0 10*3/uL (ref 0.0–0.5)
Eosinophils Relative: 0 %
HCT: 40.8 % (ref 36.0–46.0)
Hemoglobin: 14 g/dL (ref 12.0–15.0)
Immature Granulocytes: 1 %
Lymphocytes Relative: 15 %
Lymphs Abs: 1.2 10*3/uL (ref 0.7–4.0)
MCH: 30.6 pg (ref 26.0–34.0)
MCHC: 34.3 g/dL (ref 30.0–36.0)
MCV: 89.1 fL (ref 80.0–100.0)
Monocytes Absolute: 0.6 10*3/uL (ref 0.1–1.0)
Monocytes Relative: 7 %
Neutro Abs: 6.2 10*3/uL (ref 1.7–7.7)
Neutrophils Relative %: 77 %
Platelets: 314 10*3/uL (ref 150–400)
RBC: 4.58 MIL/uL (ref 3.87–5.11)
RDW: 13.2 % (ref 11.5–15.5)
WBC: 8.1 10*3/uL (ref 4.0–10.5)
nRBC: 0 % (ref 0.0–0.2)

## 2022-08-12 LAB — POCT URINE DRUG SCREEN - MANUAL ENTRY (I-SCREEN)
POC Amphetamine UR: NOT DETECTED
POC Buprenorphine (BUP): NOT DETECTED
POC Cocaine UR: NOT DETECTED
POC Marijuana UR: NOT DETECTED
POC Methadone UR: NOT DETECTED
POC Methamphetamine UR: NOT DETECTED
POC Morphine: NOT DETECTED
POC Oxazepam (BZO): POSITIVE — AB
POC Oxycodone UR: NOT DETECTED
POC Secobarbital (BAR): NOT DETECTED

## 2022-08-12 LAB — COMPREHENSIVE METABOLIC PANEL
ALT: 17 U/L (ref 0–44)
AST: 20 U/L (ref 15–41)
Albumin: 4.1 g/dL (ref 3.5–5.0)
Alkaline Phosphatase: 72 U/L (ref 38–126)
Anion gap: 10 (ref 5–15)
BUN: 12 mg/dL (ref 8–23)
CO2: 24 mmol/L (ref 22–32)
Calcium: 9.1 mg/dL (ref 8.9–10.3)
Chloride: 96 mmol/L — ABNORMAL LOW (ref 98–111)
Creatinine, Ser: 0.77 mg/dL (ref 0.44–1.00)
GFR, Estimated: >60 mL/min (ref >60)
Glucose, Bld: 104 mg/dL — ABNORMAL HIGH (ref 70–99)
Potassium: 3.4 mmol/L — ABNORMAL LOW (ref 3.5–5.1)
Sodium: 130 mmol/L — ABNORMAL LOW (ref 135–145)
Total Bilirubin: 0.5 mg/dL (ref 0.3–1.2)
Total Protein: 6.7 g/dL (ref 6.5–8.1)

## 2022-08-12 LAB — ETHANOL: Alcohol, Ethyl (B): <10 mg/dL (ref <10)

## 2022-08-12 LAB — RESP PANEL BY RT-PCR (RSV, FLU A&B, COVID)  RVPGX2
Influenza A by PCR: NEGATIVE
Influenza B by PCR: NEGATIVE
Resp Syncytial Virus by PCR: NEGATIVE
SARS Coronavirus 2 by RT PCR: NEGATIVE

## 2022-08-12 LAB — MAGNESIUM: Magnesium: 2.3 mg/dL (ref 1.7–2.4)

## 2022-08-12 LAB — POC SARS CORONAVIRUS 2 AG: SARSCOV2ONAVIRUS 2 AG: NEGATIVE

## 2022-08-12 MED ORDER — ALUM & MAG HYDROXIDE-SIMETH 200-200-20 MG/5ML PO SUSP: 30.0000 mL | ORAL | Status: DC | PRN

## 2022-08-12 MED ORDER — CLONAZEPAM 0.5 MG PO TABS
0.5000 mg | ORAL_TABLET | Freq: Two times a day (BID) | ORAL | Status: DC | PRN
  Administered 2022-08-12 – 2022-08-18 (×7): 0.5 mg via ORAL
  Filled 2022-08-12 (×8): qty 1

## 2022-08-12 MED ORDER — ALBUTEROL SULFATE HFA 108 (90 BASE) MCG/ACT IN AERS: 1.0000 | INHALATION_SPRAY | Freq: Four times a day (QID) | RESPIRATORY_TRACT | Status: DC | PRN

## 2022-08-12 MED ORDER — ACETAMINOPHEN 325 MG PO TABS
650.0000 mg | ORAL_TABLET | Freq: Four times a day (QID) | ORAL | Status: DC | PRN
  Administered 2022-08-13 – 2022-08-18 (×10): 650 mg via ORAL
  Filled 2022-08-12 (×10): qty 2

## 2022-08-12 MED ORDER — IPRATROPIUM-ALBUTEROL 0.5-2.5 (3) MG/3ML IN SOLN: 3.0000 mL | Freq: Four times a day (QID) | RESPIRATORY_TRACT | Status: DC | PRN

## 2022-08-12 MED ORDER — FAMOTIDINE 20 MG PO TABS
20.0000 mg | ORAL_TABLET | Freq: Every day | ORAL | Status: DC
  Administered 2022-08-13 – 2022-08-18 (×6): 20 mg via ORAL
  Filled 2022-08-12 (×6): qty 1

## 2022-08-12 MED ORDER — NITROGLYCERIN 0.4 MG SL SUBL: 0.4000 mg | SUBLINGUAL_TABLET | SUBLINGUAL | Status: DC | PRN

## 2022-08-12 MED ORDER — ACETAMINOPHEN 325 MG PO TABS: 650.0000 mg | ORAL_TABLET | Freq: Four times a day (QID) | ORAL | Status: DC | PRN

## 2022-08-12 MED ORDER — MAGNESIUM HYDROXIDE 400 MG/5ML PO SUSP: 30.0000 mL | Freq: Every day | ORAL | Status: DC | PRN

## 2022-08-12 MED ORDER — TRAZODONE HCL 100 MG PO TABS
100.0000 mg | ORAL_TABLET | Freq: Every evening | ORAL | Status: DC | PRN
  Administered 2022-08-12 – 2022-08-14 (×2): 100 mg via ORAL
  Filled 2022-08-12 (×2): qty 1

## 2022-08-12 MED ORDER — HYDROXYZINE HCL 10 MG PO TABS
10.0000 mg | ORAL_TABLET | Freq: Once | ORAL | Status: AC
  Administered 2022-08-12: 10 mg via ORAL
  Filled 2022-08-12: qty 1

## 2022-08-12 MED ORDER — VENLAFAXINE HCL ER 75 MG PO CP24
225.0000 mg | ORAL_CAPSULE | Freq: Every day | ORAL | Status: DC
  Administered 2022-08-13 – 2022-08-18 (×6): 225 mg via ORAL
  Filled 2022-08-12 (×6): qty 3

## 2022-08-12 NOTE — ED Notes (Addendum)
Report given to amy rn @ 1801. @ [facility]

## 2022-08-12 NOTE — BH Assessment (Signed)
Comprehensive Clinical Assessment (CCA) Note  08/12/2022 Victoria [name] [id number]  DISPOSITION:  Per [name], NP patient is recommended for geriatric inpatient bed at [facility].  The patient demonstrates the following risk factors for suicide: Chronic risk factors for suicide include: psychiatric disorder of major depressive disorder and anxiety . Acute risk factors for suicide include:  feeling overwhelmed in having to take care of adult son with medical issues. Protective factors for this patient include: positive therapeutic relationship and responsibility to others (children, family). Considering these factors, the overall suicide risk at this point appears to be moderate Patient is not appropriate for outpatient follow up.   The patient  is a 67 year old female who presents voluntarily to [facility] accompanied by her husband  due  to reporting symptoms of depression.  At the time of assessment, patient denied SI/HI/ and AVH.  Patient has a history of depression since she was a teen.    Patient acknowledged multiple symptoms of depression, including  feeling sad and unhappy, trouble sleeping, change in appetite,  and feeling more tired than usual.  Patient denies homicidal ideation/ or history of violence.  Patient denies auditory & visual hallucinations or other symptoms of psychosis.    Patient lives with her husband and one son who has medical needs. Patient denies any history of abuse and trauma. Patient has insight and judgement.  Patient's memory is intact.   Patient currently sees Dr. Charlette [name] for medication and A therapist at [place] Patient denies any previous inpatient history.  Patient denied alcohol or substance abuse.  Patient is casually dressed, alert and oriented x4 with normal speech and normal behavior.  eye contact is good.  Patient's mood is depressed, and affect is depressed and anxious.  Affect is congruent with mood. Patient's thought process is coherent  and relevant. There is no indication Patient is doe does not seem to be responding to internal stimuli or experiencing delusional thought content.  Patient was cooperative throughout assessment.   Chief Complaint:   Chief Complaint  Patient presents with   Medication Problem   Depression   Visit Diagnosis: Major Depressive Disorder    CCA Screening, Triage and Referral (STR)  Patient Reported Information How did you hear about [name]? Family/Friend  What Is the Reason for Your Visit/Call Today? Pt presents to [facility] voluntarily accompanied by her husband seeking medication management. Pt states she has been diagnosed with depression and anxiety and is prescribed medication for her symptoms but it has not been effective. Pt reports receiving therapy services through [facility] 1x a week. Pt reports passive SI but no plan or intent to harm herself. Pt denies HI, paranoia, drug or alcohol use and AVH  How Long Has This Been Causing You Problems? 1 wk - 1 month  What Do You Feel Would Help You the Most Today? Treatment for Depression or other mood problem   Have You Recently Had Any Thoughts About Hurting Yourself? Yes  Are You Planning to Commit Suicide/Harm Yourself At This time? No   Flowsheet Row ED from 08/12/2022 in [facility] Most recent reading at 08/12/2022  1:34 PM ED from 08/02/2022 in [facility] Most recent reading at 08/02/2022 12:53 PM Pre-Admission Testing 60 from 08/02/2022 in [facility] PREADMISSION TESTING Most recent reading at 08/02/2022 11:00 AM  C-SSRS RISK CATEGORY Low Risk Low Risk Moderate Risk       Have you Recently Had Thoughts  About [name]? No  Are You Planning to Harm Someone at This Time? No  Explanation: N/A   Have You Used Any Alcohol or Drugs in the Past 24 Hours? No  What Did You Use and How Much? Pt denies using any alcohol or drugs   Do You Currently  Have a Therapist/Psychiatrist? Yes  Name of Therapist/Psychiatrist: Name of Therapist/Psychiatrist: Therapist at [name] Recently Discharged From Any Office Practice or Programs? No  Explanation of Discharge From Practice/Program: N/A     CCA Screening Triage Referral Assessment Type of Contact: Face-to-Face  Telemedicine Service Delivery:   Is this Initial or Reassessment?   Date Telepsych consult ordered in [facility]:    Time Telepsych consult ordered in [facility]:    Location of Assessment: [facility] Services  Provider Location: [facility] Assessment Services   Collateral Involvement: Patient's husband Victoria [name])   Does Patient Have a Perla? No  Legal Guardian Contact Information: No legal guardian  Copy of Legal Guardianship Form: No - copy requested (Pt does not have a legal guardian)  Legal Guardian Notified of Arrival: No data recorded Legal Guardian Notified of Pending Discharge: -- (No legal guardian)  If Minor and Not Living with Parent(s), Who has Custody? BN/A  Is CPS involved or ever been involved? Never  Is APS involved or ever been involved? Never   Patient Determined To Be At Risk for Harm To Self or Others Based on Review of Patient Reported Information or Presenting Complaint? No  Method: No Plan  Availability of Means: No access or NA  Intent: Vague intent or NA  Notification Required: No need or identified person  Additional Information for Danger to Others Potential: No data recorded Additional Comments for Danger to Others Potential: Not a danger to others  Are There Guns or Other Weapons in Your Home? No  Types of Guns/Weapons: None  Are These Weapons Safely Secured?                            Yes  Who Could Verify You Are Able To Have These Secured: No weapons in the home  Do You Have any Outstanding Charges, Pending Court Dates, Parole/Probation? No legal issues  reported  Contacted To Inform of Risk of Harm To Self or Others: No data recorded   Does Patient Present under Involuntary Commitment? No data recorded   [place] of Residence: [place]   Patient Currently Receiving the Following Services: Individual Therapy; Medication Management   Determination of Need: Urgent (48 hours)   Options For Referral: Inpatient Hospitalization     CCA Biopsychosocial Patient Reported Schizophrenia/Schizoaffective Diagnosis in Past: No   Strengths: resilliant   Mental Health Symptoms Depression:   Fatigue; Increase/decrease in appetite; Sleep (too much or little); Worthlessness; Change in energy/activity   Duration of Depressive symptoms:  Duration of Depressive Symptoms: Greater than two weeks   Mania:   None   Anxiety:    Fatigue; Restlessness; Sleep   Psychosis:   None   Duration of Psychotic symptoms:    Trauma:   None   Obsessions:   None   Compulsions:   None   Inattention:   N/A   Hyperactivity/Impulsivity:   N/A   Oppositional/Defiant Behaviors:   N/A   Emotional Irregularity:   Chronic feelings of emptiness   Other Mood/Personality Symptoms:   None    Mental Status Exam  Appearance and self-care  Stature:   Average   Weight:   Overweight   Clothing:   Neat/clean   Grooming:   Normal   Cosmetic use:   Age appropriate   Posture/gait:   Normal   Motor activity:   Restless   Sensorium  Attention:   Normal   Concentration:   Normal   Orientation:   Situation; Person; Place; Object   Recall/memory:   Normal   Affect and Mood  Affect:   Flat; Depressed   Mood:   Depressed   Relating  Eye contact:   Normal   Facial expression:   Depressed   Attitude toward examiner:  No data recorded  Thought and Language  Speech flow:  Clear and Coherent   Thought content:   Appropriate to Mood and Circumstances   Preoccupation:   None   Hallucinations:   None    Organization:   Coherent   [employer] of Knowledge:   Good   Intelligence:   Average   Abstraction:   Concrete   Judgement:   Good   Reality Testing:   Adequate   Insight:   Good   Decision Making:   Normal   Social Functioning  Social Maturity:   Responsible   Social Judgement:   Normal   Stress  Stressors:   Other (Comment) (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)   Coping Ability:   Overwhelmed   Skill Deficits:   None   Supports:   Family     Religion: Religion/Spirituality Are You A Religious Person?: Yes What is Your Religious Affiliation?: Christian How Might This Affect Treatment?: Does not affect treatment  Leisure/Recreation: Leisure / Recreation Do You Have Hobbies?: Yes Leisure and Hobbies: reading, swimmin, gardening, walking  Exercise/Diet: Exercise/Diet Do You Exercise?: Yes What Type of Exercise Do You Do?: Run/Walk, Swimming How Many Times a Week Do You Exercise?: 1-3 times a week Have You Gained or Lost A Significant Amount of Weight in the Past Six Months?: No Do You Follow a Special Diet?: No Do You Have Any Trouble Sleeping?: Yes Explanation of Sleeping Difficulties: unable to relax enough to go to sleep   CCA Employment/Education Employment/Work Situation: Employment / Work Situation Employment Situation: Retired Social research officer, government has Been Impacted by Current Illness: No Has Patient ever Been in Passenger transport manager?: No  Education: Education Is Patient Currently Attending School?: No Last Grade Completed: 12 Did You Nutritional therapist?: Yes What Type of College Degree Do you Have?: completed 2 years, no degree Did You Have An Individualized Education Program (IIEP): No Did You Have Any Difficulty At School?: No Patient's Education Has Been Impacted by Current Illness: No   CCA Family/Childhood History Family and Relationship History: Family history Marital status:  Married What types of issues is patient dealing with in the relationship?: None reported Additional relationship information: none Does patient have children?: Yes How many children?: 1 How is patient's relationship with their children?: Good, adulte son has dx of cerebral palsy and mom is careegiver  Childhood History:  Childhood History By whom was/is the patient raised?: Both parents Did patient suffer any verbal/emotional/physical/sexual abuse as a child?: No Did patient suffer from severe childhood neglect?: No Has patient ever been sexually abused/assaulted/raped as an adolescent or adult?: No Was the patient ever a victim of a crime or a disaster?: No Witnessed domestic violence?: No Has patient been affected by domestic violence as an adult?: No  CCA Substance Use Alcohol/Drug Use: Alcohol / Drug Use Pain Medications: none Prescriptions: None Over the Counter: None History of alcohol / drug use?: No history of alcohol / drug abuse Longest period of sobriety (when/how long): N/A Negative Consequences of Use:  (None) Withdrawal Symptoms: None                         ASAM's:  Six Dimensions of Multidimensional Assessment  Dimension 1:  Acute Intoxication and/or Withdrawal Potential:   Dimension 1:  Description of individual's past and current experiences of substance use and withdrawal: N/A  Dimension 2:  Biomedical Conditions and Complications:   Dimension 2:  Description of patient's biomedical conditions and  complications: N/A  Dimension 3:  Emotional, Behavioral, or Cognitive Conditions and Complications:  Dimension 3:  Description of emotional, behavioral, or cognitive conditions and complications: N/A  Dimension 4:  Readiness to Change:  Dimension 4:  Description of Readiness to Change criteria: N/A  Dimension 5:  Relapse, Continued use, or Continued Problem Potential:  Dimension 5:  Relapse, continued use, or continued problem potential critiera  description: N/A  Dimension 6:  Recovery/Living Environment:  Dimension 6:  Recovery/Iiving environment criteria description: N/A  ASAM Severity Score:    ASAM Recommended Level of Treatment:     Substance use Disorder (SUD)    Recommendations for Services/Supports/Treatments: Recommendations for Services/Supports/Treatments Recommendations For Services/Supports/Treatments:  (No treatment needed)  Discharge Disposition:    DSM5 Diagnoses: Patient Active Problem List   Diagnosis Date Noted   Scleritis of right eye 02/18/2022   Diarrhea 11/16/2021   Lumbar radiculopathy 09/20/2021   Sacroiliac joint dysfunction of right side 08/16/2021   Greater trochanteric pain syndrome of left lower extremity 07/29/2021   Diverticulitis 07/07/2021   Iron deficiency anemia 09/21/2020   Family history of breast cancer 09/21/2020   Preventative health care 07/01/2020   Hemorrhoids 07/01/2020   LUQ discomfort 07/01/2020   Statin intolerance 01/10/2020   Occipital pain 06/27/2019   Tinnitus of both ears 06/27/2019   Statin myopathy 06/19/2019   Other cervical disc degeneration, unspecified cervical region 06/03/2019   Headache 03/19/2019   Heart disease 02/21/2019   Neck pain 02/17/2019   Hx of cold sores 10/09/2018   Bronchitis with asthma, acute 07/18/2018   Osteoporosis 05/23/2018   S/P angioplasty with stent 08/23/17 ostial 2nd OM with DES synnergy 08/24/2017   CAD (coronary artery disease)    Anxiety and depression 01/16/2017   Insomnia 01/16/2017   Acute recurrent sinusitis 05/27/2016   Muscle spasm 05/03/2016   Chronic pain 05/03/2016   Severe persistent asthma with intensive monitoring 03/22/2016   BMI 30.0-30.9,adult 02/18/2016   MRSA (methicillin resistant Staphylococcus aureus) colonization 01/12/2016   Sleep disorder due to stress 01/12/2016   Hyperglycemia 11/20/2015   Chronic diastolic CHF (congestive heart failure), NYHA class 2 ([place]) 06/26/2015   Coronary artery disease  due to lipid rich plaque [id number]   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   Cardiomegaly 11/21/2014   Seasonal and perennial allergic rhinitis 10/22/2011   Fatigue 09/01/2011   Chest pain, atypical 06/01/2011   OSA (obstructive sleep apnea) [id number]   UMBILICAL HERNIA [id number]   DIVERTICULOSIS, COLON 06/11/2010   HEPATIC CYST 06/11/2010   Idiopathic scoliosis and kyphoscoliosis 06/11/2010   TRANSAMINASES, SERUM, ELEVATED 05/27/2010   Abdominal pain 01/15/2010   ELEVATED BP READING WITHOUT DX HYPERTENSION 08/03/2009   Gastroesophageal reflux disease with hiatal hernia 10/21/2008   h/o PALPITATIONS 08/19/2008   A B P  A-ALLERGIC BRONCHOPULMONARY ASPERGILLOSIS 12/14/2007   Asthma 11/16/2007   Hyperlipidemia, mixed 09/11/2007   STRESS INCONTINENCE 09/11/2007   Cushing's syndrome ([place]) 12/11/2006   OA (osteoarthritis) of knee 12/11/2006     Referrals to Alternative Service(s): Referred to Alternative Service(s):   Place:   Date:   Time:    Referred to Alternative Service(s):   Place:   Date:   Time:    Referred to Alternative Service(s):   Place:   Date:   Time:    Referred to Alternative Service(s):   Place:   Date:   Time:     Anette Riedel, LCSW

## 2022-08-12 NOTE — ED Provider Notes (Signed)
[facility] Continuous Assessment Admission H&P  Date: 08/12/22 Patient Name: Victoria [name] MRN: [id number] Chief Complaint: passive suicidal ideation, anxiety and depression  Diagnoses:  Final diagnoses:  Anxiety and depression  Passive suicidal ideations    HPI: Patient presents voluntarily to [facility] behavioral health for walk-in assessment.  Patient encouraged to seek assessment by outpatient psychiatrist, Dr. Nelida Gores.  Patient is assessed, face-to-face, by nurse practitioner. She is seated in assessment area, no acute distress.  Patient's husband, Victoria [name], remains present during assessment as patient prefers.  Consulted with provider, Dr.  Leverne Humbles, and chart reviewed on 08/12/2022. She  is alert and oriented, pleasant and cooperative during assessment.   Patient  presents with anxious and depressed mood, congruent affect.  Victoria [name] reports feeling "that I do not want to go on living."  She reports passive suicidal ideations x 3 months.  Worsening suicidal ideation x 2 weeks.  She endorses symptoms including depressed mood, decreased focus, decreased sleep, increased irritability and passive suicidal ideations, worsening x 3 months.  She denies plan or intent to complete suicide.  She is unable to contract verbally for safety at this time.  She states "I am so miserable that I do not want to go on living."  Victoria [name] reports she sleeps an average of 6 to 7 hours per night.  Would prefer to sleep at least 8 hours per night.  She endorses decreased appetite times several months.  Denies any weight loss/weight gain.  She reports medications have recently been updated by outpatient provider.  She has been prescribed clonazepam 0.5 mg twice daily.  She would like to have lorazepam added to this medication.    Patient reports she is unable to sleep without lorazepam.  Patient reports taking one of her sons diazepam 10 mg tablets earlier this date for anxiety.  She reports she felt "settled for 30  minutes."  After 30 minutes anxiety continued.  Patient reports recent stressors include caring for her son who has cerebral palsy.  Patient reports "caregiving has been intense, it is always on my mind.  Case manager is attempting to place our son out of the home."  [name] reports in August 2023 she was prescribed olanzapine however this caused involuntary movements of her mouth and chin quivering, medication discontinued.  She was prescribed Latuda 1 week ago reports discontinuing this medication related to feeling "restless and fidgety."  She reports she has been prescribed trazodone for sleep however this caused "upset stomach."  She did briefly trial Remeron several weeks ago, Remeron caused increase in depressed mood.  Patient reports that Effexor briefly discontinued for approximately 6 months in 2023, replaced with Cymbalta, Cymbalta not effective.  Patient reports "30 years ago they gave me every SSRI there was and I had side effects to all of them."  Patient is followed by outpatient psychiatry, Dr. Nelida Gores at [facility] behavioral health outpatient in [place].  Diagnoses include anxiety and depression, insomnia.  She is compliant with medications.  She reports she has been stable on Effexor for approximately 30 years.  Effexor dose recently increased to 225 mg daily.  She is also seen by outpatient individual counseling, Christian-based counseling at [facility].  She meets with outpatient therapist weekly for the past 2 months.  She is also enrolled in a 10-week wellness program through her outpatient counseling center.  She denies history of inpatient psychiatric admission.  No family mental health history reported.  [name] denies homicidal ideations. Denies history of suicide attempts,  denies history of nonsuicidal self-harm behavior.   Patient has normal speech and behavior.  She  denies auditory and visual hallucinations.  Patient is able to converse coherently with goal-directed thoughts and  no distractibility or preoccupation.  Denies symptoms of paranoia.  Objectively there is no evidence of psychosis/mania or delusional thinking.  [name] resides in [place] with her husband and 52 year old son.  She denies access to weapons.  She is retired.  She denies alcohol and substance use.  She denies tobacco/nicotine use.    Patient offered support and encouragement.  Reviewed treatment plan to include inpatient psychiatric admission at [facility] geriatric inpatient psychiatric unit.  Reviewed medications, patient offered opportunity to ask questions.  Patient verbalized desire to remain full CODE STATUS.  She verbalized understanding and agreement with treatment plan.  Angeleena reported anxiety prior to transportation to [facility].  Hydroxyzine 10 mg once ordered.  Spoke briefly with Dr. Nelida Gores.  Most recent medications include clonazepam 0.5 mg daily.  Would consider increasing this to 0.5 mg twice daily, prefer not to add second benzodiazepine medication.   Total Time spent with patient: 45 minutes  Musculoskeletal  Strength & Muscle Tone: within normal limits Gait & Station: normal Patient leans: N/A  Psychiatric Specialty Exam  Presentation General Appearance:  Appropriate for Environment; Casual  Eye Contact: Good  Speech: Clear and Coherent; Normal Rate  Speech Volume: Normal  Handedness: Right   Mood and Affect  Mood: Anxious; Depressed  Affect: Congruent   Thought Process  Thought Processes: Coherent; Goal Directed; Linear  Descriptions of Associations:Intact  Orientation:Full (Time, Place and Person)  Thought Content:Logical; WDL  Diagnosis of Schizophrenia or Schizoaffective disorder in past: No   Hallucinations:Hallucinations: None  Ideas of Reference:None  Suicidal Thoughts:Suicidal Thoughts: Yes, Passive SI Passive Intent and/or Plan: Without Intent; Without Plan  Homicidal Thoughts:Homicidal Thoughts:  No   Sensorium  Memory: Immediate Good; Recent Fair  Judgment: Fair  Insight: Present   Executive Functions  Concentration: Good  Attention Span: Good  Recall: Good  Fund of Knowledge: Good  Language: Good   Psychomotor Activity  Psychomotor Activity:Psychomotor Activity: Normal   Assets  Assets: Communication Skills; Desire for Improvement; Housing; Leisure Time; Resilience; Social Support   Sleep  Sleep:Sleep: Fair Number of Hours of Sleep: 7   Nutritional Assessment (For OBS and FBC admissions only) Has the patient had a weight loss or gain of 10 pounds or more in the last 3 months?: No Has the patient had a decrease in food intake/or appetite?: No Does the patient have dental problems?: No Does the patient have eating habits or behaviors that may be indicators of an eating disorder including binging or inducing vomiting?: No Has the patient recently lost weight without trying?: 0 Has the patient been eating poorly because of a decreased appetite?: 0 Malnutrition Screening Tool Score: 0    Physical Exam Vitals and nursing note reviewed.  Constitutional:      Appearance: Normal appearance. She is well-developed.  HENT:     Head: Normocephalic and atraumatic.     Nose: Nose normal.  Cardiovascular:     Rate and Rhythm: Tachycardia present.  Pulmonary:     Effort: Pulmonary effort is normal.  Musculoskeletal:        General: Normal range of motion.     Cervical back: Normal range of motion.  Skin:    General: Skin is warm and dry.  Neurological:     Mental Status: She is alert and oriented  to person, place, and time.  Psychiatric:        Attention and Perception: Attention and perception normal.        Mood and Affect: Affect normal. Mood is anxious and depressed.        Speech: Speech normal.        Behavior: Behavior normal. Behavior is cooperative.        Thought Content: Thought content includes suicidal ideation.        Cognition  and Memory: Cognition and memory normal.   Review of Systems  Constitutional: Negative.   HENT: Negative.    Eyes: Negative.   Respiratory: Negative.    Cardiovascular: Negative.   Gastrointestinal: Negative.   Genitourinary: Negative.   Musculoskeletal: Negative.   Skin: Negative.   Neurological: Negative.   Psychiatric/Behavioral:  Positive for depression and suicidal ideas. The patient is nervous/anxious.     Blood pressure 138/73, pulse (!) 103, temperature 98.2 F (36.8 C), temperature source Oral, resp. rate 18, SpO2 100 %. There is no height or weight on file to calculate BMI.  Past Psychiatric History: see above  Is the patient at risk to self? Yes  Has the patient been a risk to self in the past 6 months? No .    Has the patient been a risk to self within the distant past? No   Is the patient a risk to others? No   Has the patient been a risk to others in the past 6 months? No   Has the patient been a risk to others within the distant past? No   Past Medical History: CAD, heart disease, obstructive sleep apnea, severe persistent asthma with intensive monitoring, osteoporosis, hyperglycemia  Family History: none reported  Social History: married, retired  Last Labs:  Admission on 08/12/2022  Component Date Value Ref Range Status   SARS Coronavirus 2 by RT PCR 08/12/2022 NEGATIVE  NEGATIVE Final   Influenza A by PCR 08/12/2022 NEGATIVE  NEGATIVE Final   Influenza B by PCR 08/12/2022 NEGATIVE  NEGATIVE Final   Comment: (NOTE) The Xpert Xpress SARS-CoV-2/FLU/RSV plus assay is intended as an aid in the diagnosis of influenza from Nasopharyngeal swab specimens and should not be used as a sole basis for treatment. Nasal washings and aspirates are unacceptable for Xpert Xpress SARS-CoV-2/FLU/RSV testing.  Fact Sheet for Patients: EntrepreneurPulse.com.au  Fact Sheet for Healthcare Providers: IncredibleEmployment.be  This test is  not yet approved or cleared by the [place] FDA and has been authorized for detection and/or diagnosis of SARS-CoV-2 by FDA under an Emergency Use Authorization (EUA). This EUA will remain in effect (meaning this test can be used) for the duration of the COVID-19 declaration under Section 564(b)(1) of the Act, 21 U.S.C. section 360bbb-3(b)(1), unless the authorization is terminated or revoked.     Resp Syncytial Virus by PCR 08/12/2022 NEGATIVE  NEGATIVE Final   Comment: (NOTE) Fact Sheet for Patients: EntrepreneurPulse.com.au  Fact Sheet for Healthcare Providers: IncredibleEmployment.be  This test is not yet approved or cleared by the [place] FDA and has been authorized for detection and/or diagnosis of SARS-CoV-2 by FDA under an Emergency Use Authorization (EUA). This EUA will remain in effect (meaning this test can be used) for the duration of the COVID-19 declaration under Section 564(b)(1) of the Act, 21 U.S.C. section 360bbb-3(b)(1), unless the authorization is terminated or revoked.  Performed at [facility], [place] [address], [place], [place] [address]    WBC 08/12/2022 8.1  4.0 -  10.5 K/uL Final   RBC 08/12/2022 4.58  3.87 - 5.11 MIL/uL Final   Hemoglobin 08/12/2022 14.0  12.0 - 15.0 g/dL Final   HCT 08/12/2022 40.8  36.0 - 46.0 % Final   MCV 08/12/2022 89.1  80.0 - 100.0 fL Final   MCH 08/12/2022 30.6  26.0 - 34.0 pg Final   MCHC 08/12/2022 34.3  30.0 - 36.0 g/dL Final   RDW 08/12/2022 13.2  11.5 - 15.5 % Final   Platelets 08/12/2022 314  150 - 400 K/uL Final   nRBC 08/12/2022 0.0  0.0 - 0.2 % Final   Neutrophils Relative % 08/12/2022 77  % Final   Neutro Abs 08/12/2022 6.2  1.7 - 7.7 K/uL Final   Lymphocytes Relative 08/12/2022 15  % Final   Lymphs Abs 08/12/2022 1.2  0.7 - 4.0 K/uL Final   Monocytes Relative 08/12/2022 7  % Final   Monocytes Absolute 08/12/2022 0.6  0.1 - 1.0 K/uL Final   Eosinophils Relative  08/12/2022 0  % Final   Eosinophils Absolute 08/12/2022 0.0  0.0 - 0.5 K/uL Final   Basophils Relative 08/12/2022 0  % Final   Basophils Absolute 08/12/2022 0.0  0.0 - 0.1 K/uL Final   Immature Granulocytes 08/12/2022 1  % Final   Abs Immature Granulocytes 08/12/2022 0.04  0.00 - 0.07 K/uL Final   Performed at [facility], [place] [address] [place], [place] [address]   Sodium 08/12/2022 130 (L)  135 - 145 mmol/L Final   Potassium 08/12/2022 3.4 (L)  3.5 - 5.1 mmol/L Final   Chloride 08/12/2022 96 (L)  98 - 111 mmol/L Final   CO2 08/12/2022 24  22 - 32 mmol/L Final   Glucose, Bld 08/12/2022 104 (H)  70 - 99 mg/dL Final   Glucose reference range applies only to samples taken after fasting for at least 8 hours.   BUN 08/12/2022 12  8 - 23 mg/dL Final   Creatinine, Ser 08/12/2022 0.77  0.44 - 1.00 mg/dL Final   Calcium 08/12/2022 9.1  8.9 - 10.3 mg/dL Final   Total Protein 08/12/2022 6.7  6.5 - 8.1 g/dL Final   Albumin 08/12/2022 4.1  3.5 - 5.0 g/dL Final   AST 08/12/2022 20  15 - 41 U/L Final   ALT 08/12/2022 17  0 - 44 U/L Final   Alkaline Phosphatase 08/12/2022 72  38 - 126 U/L Final   Total Bilirubin 08/12/2022 0.5  0.3 - 1.2 mg/dL Final   GFR, Estimated 08/12/2022 >60  >60 mL/min Final   Comment: (NOTE) Calculated using the CKD-EPI Creatinine Equation (2021)    Anion gap 08/12/2022 10  5 - 15 Final   Performed at [facility], [place] [address], [place], [place] [address]   Magnesium 08/12/2022 2.3  1.7 - 2.4 mg/dL Final   Performed at [place] [address] [place], [place] [address]   Alcohol, Ethyl (B) 08/12/2022 <10  <10 mg/dL Final   Comment: (NOTE) Lowest detectable limit for serum alcohol is 10 mg/dL.  For medical purposes only. Performed at [facility], [place] [address] [place], [place] [address]    TSH 08/12/2022 1.788  0.350 - 4.500 uIU/mL Final   Comment: Performed by a 3rd Generation assay with a functional sensitivity of <=0.01  uIU/mL. Performed at [facility], [place] [address], [place], [place] [address]    POC Amphetamine UR 08/12/2022 None Detected  NONE DETECTED (Cut Off Level 1000  ng/mL) Final   POC Secobarbital (BAR) 08/12/2022 None Detected  NONE DETECTED (Cut Off Level 300 ng/mL) Final   POC Buprenorphine (BUP) 08/12/2022 None Detected  NONE DETECTED (Cut Off Level 10 ng/mL) Final   POC Oxazepam (BZO) 08/12/2022 Positive (A)  NONE DETECTED (Cut Off Level 300 ng/mL) Final   POC Cocaine UR 08/12/2022 None Detected  NONE DETECTED (Cut Off Level 300 ng/mL) Final   POC Methamphetamine UR 08/12/2022 None Detected  NONE DETECTED (Cut Off Level 1000 ng/mL) Final   POC Morphine 08/12/2022 None Detected  NONE DETECTED (Cut Off Level 300 ng/mL) Final   POC Methadone UR 08/12/2022 None Detected  NONE DETECTED (Cut Off Level 300 ng/mL) Final   POC Oxycodone UR 08/12/2022 None Detected  NONE DETECTED (Cut Off Level 100 ng/mL) Final   POC Marijuana UR 08/12/2022 None Detected  NONE DETECTED (Cut Off Level 50 ng/mL) Final   SARSCOV2ONAVIRUS 2 AG 08/12/2022 NEGATIVE  NEGATIVE Final   Comment: (NOTE) SARS-CoV-2 antigen NOT DETECTED.   Negative results are presumptive.  Negative results do not preclude SARS-CoV-2 infection and should not be used as the sole basis for treatment or other patient management decisions, including infection  control decisions, particularly in the presence of clinical signs and  symptoms consistent with COVID-19, or in those who have been in contact with the virus.  Negative results must be combined with clinical observations, patient history, and epidemiological information. The expected result is Negative.  Fact Sheet for Patients: HandmadeRecipes.com.cy  Fact Sheet for Healthcare Providers: FuneralLife.at  This test is not yet approved or cleared by the [place] FDA and  has been authorized for detection and/or diagnosis of SARS-CoV-2  by FDA under an Emergency Use Authorization (EUA).  This EUA will remain in effect (meaning this test can be used) for the duration of  the COV                          ID-19 declaration under Section 564(b)(1) of the Act, 21 U.S.C. section 360bbb-3(b)(1), unless the authorization is terminated or revoked sooner.    Hospital Outpatient Visit on 08/02/2022  Component Date Value Ref Range Status   MRSA, PCR 08/02/2022 POSITIVE (A)  NEGATIVE Final   Comment: RESULT CALLED TO, READ BACK BY AND VERIFIED WITH: EMAILED Charolett Bumpers [phone] '@[id number]'$  FH    Staphylococcus aureus 08/02/2022 POSITIVE (A)  NEGATIVE Final   Comment: (NOTE) The Xpert SA Assay (FDA approved for NASAL specimens in patients 74 years of age and older), is one component of a comprehensive surveillance program. It is not intended to diagnose infection nor to guide or monitor treatment. Performed at [facility], [place] [address] [place], [place] [address]    Sodium 08/02/2022 133 (L)  135 - 145 mmol/L Final   Potassium 08/02/2022 3.8  3.5 - 5.1 mmol/L Final   Chloride 08/02/2022 99  98 - 111 mmol/L Final   CO2 08/02/2022 24  22 - 32 mmol/L Final   Glucose, Bld 08/02/2022 100 (H)  70 - 99 mg/dL Final   Glucose reference range applies only to samples taken after fasting for at least 8 hours.   BUN 08/02/2022 11  8 - 23 mg/dL Final   Creatinine, Ser 08/02/2022 0.59  0.44 - 1.00 mg/dL Final   Calcium 08/02/2022 9.0  8.9 - 10.3 mg/dL Final   GFR, Estimated 08/02/2022 >60  >60 mL/min Final   Comment: (NOTE) Calculated using the CKD-EPI Creatinine  Equation (2021)    Anion gap 08/02/2022 10  5 - 15 Final   Performed at [facility], [place] [address] [place], [place] [address]   WBC 08/02/2022 10.9 (H)  4.0 - 10.5 K/uL Final   RBC 08/02/2022 4.43  3.87 - 5.11 MIL/uL Final   Hemoglobin 08/02/2022 13.2  12.0 - 15.0 g/dL Final   HCT 08/02/2022 41.2  36.0 - 46.0 % Final   MCV 08/02/2022 93.0  80.0 - 100.0 fL Final   MCH  08/02/2022 29.8  26.0 - 34.0 pg Final   MCHC 08/02/2022 32.0  30.0 - 36.0 g/dL Final   RDW 08/02/2022 13.5  11.5 - 15.5 % Final   Platelets 08/02/2022 300  150 - 400 K/uL Final   nRBC 08/02/2022 0.0  0.0 - 0.2 % Final   Performed at [facility], [place] [address] [place], [place] [address]   ABO/RH(D) 08/02/2022 O POS   Final   Antibody Screen 08/02/2022 NEG   Final   Sample Expiration 08/02/2022 08/16/2022,2359   Final   Extend sample reason 08/02/2022    Final                   Value:NO TRANSFUSIONS OR PREGNANCY IN THE PAST 3 MONTHS Performed at [facility], [place] [address] [place], [place] [address]   Office Visit on 05/26/2022  Component Date Value Ref Range Status   VITD 05/26/2022 70.84  30.00 - 100.00 ng/mL Final   WBC 05/26/2022 8.7  4.0 - 10.5 K/uL Final   RBC 05/26/2022 4.47  3.87 - 5.11 Mil/uL Final   Hemoglobin 05/26/2022 13.5  12.0 - 15.0 g/dL Final   HCT 05/26/2022 41.0  36.0 - 46.0 % Final   MCV 05/26/2022 91.8  78.0 - 100.0 fl Final   MCHC 05/26/2022 32.9  30.0 - 36.0 g/dL Final   RDW 05/26/2022 14.1  11.5 - 15.5 % Final   Platelets 05/26/2022 304.0  150.0 - 400.0 K/uL Final   Neutrophils Relative % 05/26/2022 82.4 (H)  43.0 - 77.0 % Final   Lymphocytes Relative 05/26/2022 11.4 (L)  12.0 - 46.0 % Final   Monocytes Relative 05/26/2022 6.2  3.0 - 12.0 % Final   Eosinophils Relative 05/26/2022 0.0  0.0 - 5.0 % Final   Basophils Relative 05/26/2022 0.0  0.0 - 3.0 % Final   Neutro Abs 05/26/2022 7.1  1.4 - 7.7 K/uL Final   Lymphs Abs 05/26/2022 1.0  0.7 - 4.0 K/uL Final   Monocytes Absolute 05/26/2022 0.5  0.1 - 1.0 K/uL Final   Eosinophils Absolute 05/26/2022 0.0  0.0 - 0.7 K/uL Final   Basophils Absolute 05/26/2022 0.0  0.0 - 0.1 K/uL Final   Sodium 05/26/2022 138  135 - 145 mEq/L Final   Potassium 05/26/2022 4.0  3.5 - 5.1 mEq/L Final   Chloride 05/26/2022 100  96 - 112 mEq/L Final   CO2 05/26/2022 30  19 - 32 mEq/L Final   Glucose, Bld 05/26/2022 80   70 - 99 mg/dL Final   BUN 05/26/2022 18  6 - 23 mg/dL Final   Creatinine, Ser 05/26/2022 0.63  0.40 - 1.20 mg/dL Final   Total Bilirubin 05/26/2022 0.3  0.2 - 1.2 mg/dL Final   Alkaline Phosphatase 05/26/2022 56  39 - 117 U/L Final   AST 05/26/2022 16  0 - 37 U/L Final   ALT 05/26/2022 17  0 - 35 U/L Final   Total Protein 05/26/2022 6.5  6.0 - 8.3 g/dL Final   Albumin 05/26/2022 4.4  3.5 - 5.2 g/dL Final   GFR 05/26/2022 93.33  >60.00 mL/min Final   Calculated using the CKD-EPI Creatinine Equation (2021)   Calcium 05/26/2022 9.3  8.4 - 10.5 mg/dL Final   Cholesterol 05/26/2022 208 (H)  0 - 200 mg/dL Final   ATP III Classification       Desirable:  < 200 mg/dL               Borderline High:  200 - 239 mg/dL          High:  > = 240 mg/dL   Triglycerides 05/26/2022 113.0  0.0 - 149.0 mg/dL Final   Normal:  <150 mg/dLBorderline High:  150 - 199 mg/dL   HDL 05/26/2022 74.00  >39.00 mg/dL Final   VLDL 05/26/2022 22.6  0.0 - 40.0 mg/dL Final   LDL Cholesterol 05/26/2022 111 (H)  0 - 99 mg/dL Final   Total CHOL/HDL Ratio 05/26/2022 3   Final                  Men          Women1/2 Average Risk     3.4          3.3Average Risk          5.0          4.42X Average Risk          9.6          7.13X Average Risk          15.0          11.0                       NonHDL 05/26/2022 133.68   Final   NOTE:  Non-HDL goal should be 30 mg/dL higher than patient's LDL goal (i.e. LDL goal of < 70 mg/dL, would have non-HDL goal of < 100 mg/dL)   TSH 05/26/2022 1.43  0.35 - 5.50 uIU/mL Final   Hgb A1c MFr Bld 05/26/2022 5.6  4.6 - 6.5 % Final   Glycemic Control Guidelines for People with Diabetes:Non Diabetic:  <6%Goal of Therapy: <7%Additional Action Suggested:  >8%    Free T4 05/26/2022 0.82  0.60 - 1.60 ng/dL Final   Comment: Specimens from patients who are undergoing biotin therapy and /or ingesting biotin supplements may contain high levels of biotin.  The higher biotin concentration in these specimens  interferes with this Free T4 assay.  Specimens that contain high levels  of biotin may cause false high results for this Free T4 assay.  Please interpret results in light of the total clinical presentation of the patient.     T3, Free 05/26/2022 3.4  2.3 - 4.2 pg/mL Final  Office Visit on 03/02/2022  Component Date Value Ref Range Status   Total CK 03/02/2022 351 (H)  29 - 143 U/L Final   Aldolase 03/02/2022 5.8  < OR = 8.1 U/L Final   JO-1 AB 03/02/2022 <11  <11 SI Final   PL-7 AB 03/02/2022 <11  <11 SI Final   PL-12 AB 03/02/2022 <11  <11 SI Final   EJ AB 03/02/2022 <11  <11 SI Final   OJ AB 03/02/2022 <11  <11 SI Final   SRP-AB 03/02/2022 <11  <11 SI Final   MI-2 ALPHA AB 03/02/2022 <11  <11 SI Final   MI-2 BETA AB 03/02/2022 <11  <11  SI Final   MDA-5 AB 03/02/2022 <11  <11 SI Final   TIF-1y AB 03/02/2022 <11  <11 SI Final   NXP-2 AB 03/02/2022 <11  <11 SI Final   Comment: Marland Kitchen Myositis-specific autoantibodies (MSAs) are highly selective, generally mutually exclusive, and are associated with a particular clinical phenotype within the myositis spectrum. Marland Kitchen Anti-synthetase syndrome is associated with MSAs to cytoplasmic enzymes and tRNAs involved with the synthesis of proteins. Target antigens include Jo-1, PL-7, PL-12, EJ, and OJ. Clinically, anti-synthetase syndrome is primarily characterized by myositis and lung inflammation. . Dermatomyositis is associated with MSAs to SRP, Mi-2A, Mi-2B, and clinically this disease is characterized by myositis in association with a rash. Additionally, MSAs to MDA5 (CADM140) have been identified in patients with clinically amyopathic dermatomyositis and rapidly progressive lung disease. MSAs to TIF1-y and NXP-2, collectively, are seen in >40% of children with dermatomyositis and appear to identify those with more severe disease. Finally, TIF1-y Ab has been reported in adults with dermatomyositis and is                           associated  with malignancy, but not in children. . SRP Ab has also been associated with necrotizing myopathy, a disease with unique histological features and an aggressive clinical course. . This test was developed and its analytical performance characteristics have been determined by [employer]. It has not been cleared or approved by the FDA. This assay has been validated pursuant to the CLIA regulations and is used for clinical purposes. Marland Kitchen    HMGCR AB (IGG) 03/02/2022 <2  <20 CU Final   Comment: . 3-Hydroxy-3-Methylglutaryl-Coenzyme A Reductase (HMGCR) Ab is associated with necrotizing myopathy and is often found with the use of statin medications. Rarely, HMGCR Ab associated myositis has also been seen in patients ingesting mushrooms and other foods.     Allergies: Latuda [lurasidone hcl], Beclomethasone dipropionate, Flexeril [cyclobenzaprine], Mometasone furo-formoterol fum, Sulfonamide derivatives, and Statins  Medications:  Facility Ordered Medications  Medication   acetaminophen (TYLENOL) tablet 650 mg   alum & mag hydroxide-simeth (MAALOX/MYLANTA) 200-200-20 MG/5ML suspension 30 mL   magnesium hydroxide (MILK OF MAGNESIA) suspension 30 mL   PTA Medications  Medication Sig   Bioflavonoid Products (ESTER C PO) Take 500 mg by mouth at bedtime.   VITAMIN D PO Take 5,000 Units by mouth at bedtime.   Benralizumab (FASENRA PEN) 30 MG/ML SOAJ Inject 1 mL (30 mg total) into the skin every 8 (eight) weeks.   Calcium-Magnesium-Vitamin D (CALCIUM MAGNESIUM PO) Take 1 tablet by mouth at bedtime.   famotidine (PEPCID) 20 MG tablet TAKE 1 TABLET BY MOUTH TWICE A DAY (Patient taking differently: Take 20 mg by mouth daily.)   Evolocumab (REPATHA SURECLICK) [id number] MG/ML SOAJ INJECT 1 PEN INTO THE SKIN EVERY 14 DAYS (Patient taking differently: Inject 140 mg into the skin every 21 ( twenty-one) days.)   nitroGLYCERIN (NITROSTAT) 0.4 MG SL tablet PLACE 1 TABLET UNDER THE TONGUE EVERY 5 MINUTES  AS NEEDED FOR CHEST PAIN. (Patient taking differently: 0.4 mg every 5 (five) minutes as needed for chest pain.)   albuterol (VENTOLIN HFA) 108 (90 Base) MCG/ACT inhaler Inhale 1-2 puffs into the lungs every 6 (six) hours as needed.   ipratropium-albuterol (DUONEB) 0.5-2.5 (3) MG/3ML SOLN Take 3 mLs by nebulization every 6 (six) hours as needed. (Patient taking differently: Take 3 mLs by nebulization every 6 (six) hours as needed (For shortness of breath or wheezing).)  budesonide (ENTOCORT EC) 3 MG 24 hr capsule Take 3 caps a day for 4 weeks then take 2 caps daily for 4 weeks then take 1 capsule daily for 4 weeks. (Patient taking differently: Take 3-9 mg by mouth See admin instructions. Take 3 caps a day for 4 weeks then take 2 caps daily for 4 weeks then take 1 capsule daily for 4 weeks starting on 06/27/22.)   venlafaxine XR (EFFEXOR XR) 75 MG 24 hr capsule Take 3 capsules (225 mg total) by mouth daily with breakfast.   LORazepam (ATIVAN) 1 MG tablet Take 1 tablet (1 mg total) by mouth at bedtime as needed for anxiety or sleep. (Patient taking differently: Take 1 mg by mouth at bedtime.)    Medical Decision Making  Patient remains voluntary.  Inpatient psychiatric geriatric admission recommended.  Patient accepted to [facility] inpatient psychiatric geriatric unit.  Laboratory studies ordered including CBC, CMP, ethanol, magnesium, prolactin and TSH.  Urine drug screen ordered.  EKG order initiated.  Current medications: -Acetaminophen 650 mg every 6 as needed/mild pain -Maalox 30 mL oral every 4 as needed/digestion -Hydroxyzine '10mg'$  once -Magnesium hydroxide 30 mL daily as needed/mild constipation    Recommendations  Based on my evaluation the patient does not appear to have an emergency medical condition.  Lucky Rathke, FNP 08/12/22  5:11 PM

## 2022-08-12 NOTE — Progress Notes (Signed)
Pt belongings inventoried and placed in bottom locker #7. Pt signed belonging sheet.

## 2022-08-12 NOTE — Tx Team (Signed)
Initial Treatment Plan 08/12/2022 9:55 PM Victoria [name] [id number]    PATIENT STRESSORS: Medication change or noncompliance   Other: caring for adult son with cerebral palsy     PATIENT STRENGTHS: Ability for insight  Average or above average intelligence  Capable of independent living  Motivation for treatment/growth  Religious Affiliation  Supportive family/friends    PATIENT IDENTIFIED PROBLEMS: Passive SI  Anxiety  Depression  Insomnia  ("Pt says medication is not working; needs another solution to her overwhelming anxiety.")             DISCHARGE CRITERIA:  Improved stabilization in mood, thinking, and/or behavior Motivation to continue treatment in a less acute level of care Verbal commitment to aftercare and medication compliance  PRELIMINARY DISCHARGE PLAN: Outpatient therapy Return to previous living arrangement  PATIENT/FAMILY INVOLVEMENT: This treatment plan has been presented to and reviewed with the patient, Victoria [name], and/or family member.  The patient and family have been given the opportunity to ask questions and make suggestions.  Lajoyce Corners, RN 08/12/2022, 9:55 PM

## 2022-08-12 NOTE — Progress Notes (Signed)
Patient ID: Victoria [name], female   DOB: Jan 12, 1956, 67 y.o.   MRN: [id number]  D: Pt here voluntarily from [facility] for passive SI, anxiety and depression. Pt denies SI/HI/AVH and pain at this time. Pt contracts for safety. Pt states that this is her first inpatient admission. Pt was recommended to go to [facility] by her doctor and then they recommended inpatient. Pt has been feeling overwhelmed and has increased anxiety and depression. The last couple weeks, pt reports waking up "sick to my stomach" and immediately feeling anxiety. Pt is sitting in exam room rocking back and forth as she speaks. She does not want to kill herself and has no intent. Just wants help for her anxiety.  Pt has been using several different medications in the past year and has seen no improvement in symptoms. "I feel like it has gotten worse. I can't express emotion since I started olanzapine. I want to cry now, but I can't." Pt presents with flat affect. Pt also has some involuntary movements in the perioral area. "My family noticed that I was having those movements. I didn't know. When I told my doctor, he stopped the olanzapine." Pt has also tried Remeron and Latuda recently, that were both discontinued by her doctor. The Latuda was discontinued for increased restlessness. Pt Took Cymbalta last year from April to December before it was discontinued. Pt has been taking Effexor for the past 30 years (pt report). Dosage was increased in 2004 when she moved from [place] to [place]. "It was a difficult time and so my medication was increased. Dosage was increased again to 225 mg in August 2023 after she had COVID and experienced breakthrough anxiety. "My doctor was prescribing Buspar for breakthrough anxiety but that wasn't working."   Pt stated that she took one of her son's Valium pills this morning at the suggestion of her husband. "In about 30 minutes, it helped me to relax." Pt cautioned and educated on the dangers of taking medication not  prescribed for her. Pt also stated that she stopped taking her prescribed Ativan in January without being weaned off. States she was using it for 3 years. Didn't want to get addicted, so stopped taking it. Again, pt educated on dangers of this. Pt denies any seizure activity since she stopped using them.  Pt endorses decreased appetite, insomnia, worry, anxiety and depression. "I want my peace back. That peace that comes from God. I read my devotional and my Bible in the morning. Lately, I can't concentrate to read  very much. I should have more faith to trust God with all this." Pt and her husband are also the caregivers to her 63 year old son with cerebral palsy who is total care and lives with them. Pt sees a therapist and had her first psychiatrist visit in January 2024.   A: Pt was offered support and encouragement. Pt is cooperative during assessment. VS assessed and admission paperwork signed. Belongings searched and contraband items placed in locker. Non-invasive skin search completed: no marks or scars noted. Pt introduced to unit milieu by nursing staff. Q 15 minute checks were started for safety.   R: Pt in room. Pt safety maintained on unit.

## 2022-08-12 NOTE — Progress Notes (Addendum)
Pt was accepted to [facility] 08/12/22; Bed Assignment L-33  Pt meets inpatient criteria per [name], FNP  Attending Physician will be Dr. Caren Griffins  Report can be called to:  [phone] or [phone]   Pt can arrive: BED IS READY  Care Team notified: [facility] Scharlene Gloss, RN, Mardene Sayer, RN, Amy Bosivert, RN, [name], FNP, Alethia Berthold, MD, Denna Haggard, LCSWA, Graceann Congress, RN   [place], LCSWA 08/12/2022 @ 5:46 PM

## 2022-08-12 NOTE — Progress Notes (Signed)
   08/12/22 2000  Psych Admission Type (Psych Patients Only)  Admission Status Voluntary  Psychosocial Assessment  Patient Complaints Anxiety;Depression;Insomnia;Self-harm thoughts;Other (Comment) (feels emotionless and flat; says she wants to cry but can't)  Eye Contact Fair  Facial Expression Flat  Affect Anxious  Speech Logical/coherent  Interaction Minimal  Motor Activity Slow;Restless  Appearance/Hygiene Unremarkable  Behavior Characteristics Cooperative;Appropriate to situation;Anxious  Mood Anxious;Depressed;Sad  Thought Process  Coherency WDL  Content WDL  Delusions None reported or observed  Perception WDL  Hallucination None reported or observed  Judgment WDL  Confusion None  Danger to Self  Current suicidal ideation? Denies  Agreement Not to Harm Self Yes  Description of Agreement verbal  Danger to Others  Danger to Others None reported or observed

## 2022-08-12 NOTE — ED Notes (Signed)
Patient wanted me to call her husband to tell him she had been transferred phone to to voice mail. Did not leave a message.

## 2022-08-12 NOTE — Telephone Encounter (Signed)
Patient called stating that for the past couple of weeks she has been having some terrible anxiety in the mornings and it seems to be getting worse everyday she is asking for xanax or valium to help with these horrible times she feels she needs something fast acting, she stated that this last for 2-3 hours she is trying to read her bible and pray more but all she seem to do is rock back and forth and she can not sit still. Please advise

## 2022-08-12 NOTE — ED Notes (Addendum)
Pt admitted to obs.Patient denies  SI/HI/AVH. Calm, cooperative throughout interview process. Skin assessment completed. Oriented to unit. Meal and drink offered. At [place], pt continue to denies  SI/HI/AVH. Pt verbally contract for safety. Will monitor for safety. Patient items were put in locker 3

## 2022-08-12 NOTE — ED Notes (Addendum)
Patient A&O x 4, ambulatory. Patient discharged in no acute distress. Patient denied SI/HI, A/VH upon discharge.  Pt belongings returned to patient from locker #  3intact. Patient escorted [place] to go to [facility] via safe transport. Safety maintained.

## 2022-08-12 NOTE — ED Triage Notes (Signed)
Pt presents to [facility] voluntarily accompanied by her husband seeking medication management. Pt states she has been diagnosed with depression and anxiety and is prescribed medication for her symptoms but it has not been effective. Pt reports receiving therapy services through [facility] 1x a week. Pt reports passive SI but no plan or intent to harm herself. Pt denies HI, paranoia, drug or alcohol use and AVH

## 2022-08-12 NOTE — ED Notes (Signed)
Safe transport called 

## 2022-08-13 DIAGNOSIS — R45851 Suicidal ideations: Secondary | ICD-10-CM

## 2022-08-13 LAB — PROLACTIN: Prolactin: 15.4 ng/mL (ref 3.6–25.2)

## 2022-08-13 MED ORDER — PINDOLOL 5 MG PO TABS
5.0000 mg | ORAL_TABLET | Freq: Every day | ORAL | Status: DC
  Administered 2022-08-13 – 2022-08-18 (×6): 5 mg via ORAL
  Filled 2022-08-13 (×6): qty 1

## 2022-08-13 MED ORDER — MIRTAZAPINE 15 MG PO TABS
15.0000 mg | ORAL_TABLET | Freq: Every day | ORAL | Status: DC
  Administered 2022-08-13 – 2022-08-14 (×2): 15 mg via ORAL
  Filled 2022-08-13 (×2): qty 1

## 2022-08-13 MED ORDER — ALBUTEROL SULFATE HFA 108 (90 BASE) MCG/ACT IN AERS: 1.0000 | INHALATION_SPRAY | Freq: Four times a day (QID) | RESPIRATORY_TRACT | Status: DC

## 2022-08-13 MED ORDER — ALBUTEROL SULFATE HFA 108 (90 BASE) MCG/ACT IN AERS
1.0000 | INHALATION_SPRAY | Freq: Four times a day (QID) | RESPIRATORY_TRACT | Status: DC | PRN
  Administered 2022-08-13 (×2): 2 via RESPIRATORY_TRACT
  Filled 2022-08-13: qty 6.7

## 2022-08-13 NOTE — Group Note (Signed)
Date:  08/13/2022 Time:  6:04 PM  Group Topic/Focus:  Overcoming Stress:   The focus of this group is to define stress and help patients assess their triggers. Self Care:   The focus of this group is to help patients understand the importance of self-care in order to improve or restore emotional, physical, spiritual, interpersonal, and financial health.    Participation Level:  Active  Participation Quality:  Appropriate and Attentive  Affect:  Appropriate  Cognitive:  Alert, Appropriate, and Oriented  Insight: Appropriate and Good  Engagement in Group:  Engaged  Modes of Intervention:  Discussion  Additional Comments:    Ladona Mow 08/13/2022, 6:04 PM

## 2022-08-13 NOTE — BHH Suicide Risk Assessment (Signed)
[facility] Admission Suicide Risk Assessment   Nursing information obtained from:  Patient Demographic factors:  NA Current Mental Status:  NA Loss Factors:  NA Historical Factors:  NA Risk Reduction Factors:  Positive social support, Religious beliefs about death, Living with another person, especially a relative, Sense of responsibility to family  Total Time spent with patient: 1 hour Principal Problem: Passive suicidal ideations Diagnosis:  Principal Problem:   Passive suicidal ideations  Subjective Data:  Victoria [name] is a 67 y.o. female with a history of anxiety, depression, OSA who presented to [place] at [facility] for initial evaluation on 06/30/2022.   At initial evaluation patient reported symptoms of anxiety including feeling nervous or on edge, being unable to stop controlled worrying, worrying too much about different things, and fear that something awful would happen.  Patient's symptoms have been gradually progressing.  There was a period in November 2023 where she was having passive SI without intent or plan.  Patient also did endorse low mood, poor sleep, difficulty concentrating.  She does have a history of obstructive sleep apnea and does not use CPAP with last sleep study being over 10 years ago. Psychosocially patient has increased stress of caring for her adult son with cerebral palsy and planning the next steps for him after she and her husband pass or are no longer able to care for him.  Patient met criteria for MDD and generalized anxiety disorder. Of note on exam patient was found to have smacking of her lips repetitive movements of her mouth which are concerning for parkinsonism secondary to Zyprexa.     Victoria [name] presents for follow-up evaluation. Today, 08/11/22, patient reports continued anxiety and depression over the past week.  She had initially called in the interim due to excessive akathisia on Latuda leading to his discontinuing the  medication.  At that time she however thought her mood symptoms had been improving.  Unfortunately patient reported a decline in her mood and increase in her anxiety along with an improvement in her akathisia symptoms after discontinuing Latuda.  She currently also feels like there might be some signs of increased restlessness and fidgeting which she attributes to the increased Effexor.  It is possible that she is experiencing some signs of serotonin activation which can improve over time.  Patient was encouraged to continue on the medication for a full 6 weeks before tapering the dose.  Patient also feels that trazodone has not been as effective for sleep and would like to switch back to Ativan.  We explained that while this is an option we would have to discontinue Klonopin and she was in agreement.  Patient is aware of the risk and benefits of this.  We will also look into [place] as an option for this patient.  Continued Clinical Symptoms:  Alcohol Use Disorder Identification Test Final Score (AUDIT): 0 The "Alcohol Use Disorders Identification Test", Guidelines for Use in Primary Care, Second Edition.  World Pharmacologist [facility]). Score between 0-7:  no or low risk or alcohol related problems. Score between 8-15:  moderate risk of alcohol related problems. Score between 16-19:  high risk of alcohol related problems. Score 20 or above:  warrants further diagnostic evaluation for alcohol dependence and treatment.   CLINICAL FACTORS:   Severe Anxiety and/or Agitation Depression:   Insomnia   Musculoskeletal: Strength & Muscle Tone: within normal limits Gait & Station: normal Patient leans: N/A  Psychiatric Specialty Exam:  Presentation  General Appearance:  Appropriate for Environment; Casual  Eye Contact: Good  Speech: Clear and Coherent; Normal Rate  Speech Volume: Normal  Handedness: Right   Mood and Affect  Mood: Anxious; Depressed  Affect: Congruent   Thought  Process  Thought Processes: Coherent; Goal Directed; Linear  Descriptions of Associations:Intact  Orientation:Full (Time, Place and Person)  Thought Content:Logical; WDL  History of Schizophrenia/Schizoaffective disorder:No  Duration of Psychotic Symptoms:No data recorded Hallucinations:Hallucinations: None  Ideas of Reference:None  Suicidal Thoughts:Suicidal Thoughts: Yes, Passive SI Passive Intent and/or Plan: Without Intent; Without Plan  Homicidal Thoughts:Homicidal Thoughts: No   Sensorium  Memory: Immediate Good; Recent Fair  Judgment: Fair  Insight: Present   Executive Functions  Concentration: Good  Attention Span: Good  Recall: Good  Fund of Knowledge: Good  Language: Good   Psychomotor Activity  Psychomotor Activity: Psychomotor Activity: Normal   Assets  Assets: Communication Skills; Desire for Improvement; Housing; Leisure Time; Resilience; Social Support   Sleep  Sleep: Sleep: Fair Number of Hours of Sleep: 7    Blood pressure (!) 135/90, pulse 90, temperature 98.7 F (37.1 C), temperature source Oral, resp. rate 18, height '5\' 1"'$  (1.549 m), weight 68.3 kg, SpO2 100 %. Body mass index is 28.44 kg/m.   COGNITIVE FEATURES THAT CONTRIBUTE TO RISK:  None    SUICIDE RISK:   Minimal: No identifiable suicidal ideation.  Patients presenting with no risk factors but with morbid ruminations; may be classified as minimal risk based on the severity of the depressive symptoms  PLAN OF CARE: See orders  I certify that inpatient services furnished can reasonably be expected to improve the patient's condition.   Parks Ranger, DO 08/13/2022, 12:03 PM

## 2022-08-13 NOTE — H&P (Signed)
Psychiatric Admission Assessment Adult  Patient Identification: Victoria [name] MRN:  [id number] Date of Evaluation:  08/13/2022 Chief Complaint:  Passive suicidal ideations [R45.851] Principal Diagnosis: Passive suicidal ideations Diagnosis:  Principal Problem:   Passive suicidal ideations  History of Present Illness: Victoria [name] is a 67 year old white female who was voluntarily admitted to inpatient psychiatry for worsening depression and suicidal ideation.  She is originally from [place] and is currently residing in [place] for which she cares for an adult son who has cerebral palsy with her husband.  She currently sees Dr. Nelida Gores outpatient in [place] and she has had some numerous medication changes.  The thing that has worked the best for her has been Effexor XR.  But it stopped working.  She has recently been on Latuda and Zyprexa and seems to have developed a little bit of akathisia.  She endorses anhedonia, difficulty sleeping, anxiety, depressed mood and hopelessness.  She said that the Effexor just stopped working but recently was increased to 225.  I talked to her about pindolol and how that can help with her akathisia and increase the effectiveness of her antidepressant.  She does not have any past suicide attempts.  She has been in [place] since 2004.  Her recent visit with Dr. Nelida Gores is below which was dated 08/11/2022:  Victoria [name] is a 67 y.o. female with a history of anxiety, depression, OSA who presented to [place] at [facility] for initial evaluation on 06/30/2022.   At initial evaluation patient reported symptoms of anxiety including feeling nervous or on edge, being unable to stop controlled worrying, worrying too much about different things, and fear that something awful would happen.  Patient's symptoms have been gradually progressing.  There was a period in November 2023 where she was having passive SI without intent or plan.  Patient also  did endorse low mood, poor sleep, difficulty concentrating.  She does have a history of obstructive sleep apnea and does not use CPAP with last sleep study being over 10 years ago. Psychosocially patient has increased stress of caring for her adult son with cerebral palsy and planning the next steps for him after she and her husband pass or are no longer able to care for him.  Patient met criteria for MDD and generalized anxiety disorder. Of note on exam patient was found to have smacking of her lips repetitive movements of her mouth which are concerning for parkinsonism secondary to Zyprexa.     Victoria [name] presents for follow-up evaluation. Today, 08/11/22, patient reports continued anxiety and depression over the past week.  She had initially called in the interim due to excessive akathisia on Latuda leading to his discontinuing the medication.  At that time she however thought her mood symptoms had been improving.  Unfortunately patient reported a decline in her mood and increase in her anxiety along with an improvement in her akathisia symptoms after discontinuing Latuda.  She currently also feels like there might be some signs of increased restlessness and fidgeting which she attributes to the increased Effexor.  It is possible that she is experiencing some signs of serotonin activation which can improve over time.  Patient was encouraged to continue on the medication for a full 6 weeks before tapering the dose.  Patient also feels that trazodone has not been as effective for sleep and would like to switch back to Ativan.  We explained that while this is an option we would have to discontinue Klonopin and she  was in agreement.  Patient is aware of the risk and benefits of this.  We will also look into [place] as an option for this patient. Associated Signs/Symptoms: Depression Symptoms:  depressed mood, anhedonia, insomnia, anxiety, (Hypo) Manic Symptoms:   Unremarkable Anxiety Symptoms:  Excessive  Worry, Panic Symptoms, Social Anxiety, Psychotic Symptoms:   Unremarkable PTSD Symptoms: NA Total Time spent with patient: 1 hour  Past Psychiatric History:  No prior psychiatric hospitalizations. No prior suicide attempts.  Patient did endorse passive SI in November of 2023 without any intent or plan.  She denies being connected with a psychiatrist in the past and had recently connected with a therapist at [facility] therapy.   Is the patient at risk to self? Yes.    Has the patient been a risk to self in the past 6 months? Yes.    Has the patient been a risk to self within the distant past? Yes.    Is the patient a risk to others? No.  Has the patient been a risk to others in the past 6 months? No.  Has the patient been a risk to others within the distant past? No.   [place] Scale:  [place] Admission (Current) from 08/12/2022 in [facility] Most recent reading at 08/12/2022  9:31 PM ED from 08/12/2022 in [facility] Most recent reading at 08/12/2022  3:47 PM ED from 08/02/2022 in [facility] Most recent reading at 08/02/2022 12:53 PM  C-SSRS RISK CATEGORY Low Risk No Risk Low Risk        Prior Inpatient Therapy: No. If yes, describe no Prior Outpatient Therapy: Yes.   If yes, describe see above  Alcohol Screening: 1. How often do you have a drink containing alcohol?: Never 2. How many drinks containing alcohol do you have on a typical day when you are drinking?: 1 or 2 3. How often do you have six or more drinks on one occasion?: Never AUDIT-C Score: 0 9. Have you or someone else been injured as a result of your drinking?: No 10. Has a relative or friend or a doctor or another health worker been concerned about your drinking or suggested you cut down?: No Alcohol Use Disorder Identification Test Final Score (AUDIT): 0 Substance Abuse History in the last 12 months:  No. 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 LHC 6/16:  oOM1 60, pRCA 25 >> med Rx b. cath 3/19 2nd OM with 95% stenosis s/p synergy DES & anomalous RCA   CAP (community acquired pneumonia) 2016; 06/07/2016   Chronic bronchitis (HCC)    Chronic lower back pain    Complication of anesthesia    "think I have a hard time waking up from it"   COPD (chronic obstructive pulmonary disease) ([place])    Depression    mild   Diverticulitis    Diverticulosis    GERD (gastroesophageal reflux disease)    H/O hiatal hernia    Headache    "weekly" (08/23/2017)   History of echocardiogram    Echo 6/16:  Mod LVH, EF 60-65%, no RWMA, Gr 1 DD, trivial MR, normal LA  size.   Hyperglycemia 11/20/2015   Hyperlipidemia, mixed 09/11/2007   Qualifier: Diagnosis of  By: Jerold Coombe   Did not tolerate Lipitor, zocor, Lovastatin, Pravastatin, Livalo, Crestor even low dose    IBS (irritable bowel syndrome)    Maxillary sinusitis    Normal cardiac stress test 11/2011   No evidence of ischemia or infarct.   Calculated ejection fraction 72%.   Obesity    OSA (obstructive sleep apnea) 02/2012   has stopped using  cpap   Osteoarthritis    Osteoporosis    Pneumonia 11/2011   "before 2013 I hadn't had pneumonia since I was a child" (04/13/2012)   Pulmonary nodules    S/P angioplasty with stent 08/23/17 ostial 2nd OM with DES synnergy 08/24/2017   Schatzki's ring     Past Surgical History:  Procedure Laterality Date   APPENDECTOMY  1989   CARDIAC CATHETERIZATION N/A 11/25/2014   Procedure: Right/Left Heart Cath and Coronary Angiography;  Surgeon: Belva Crome, MD;  Location: [facility];  Service: Cardiovascular;   Laterality: N/A;   [place]   COLONOSCOPY  03/2022   CORONARY ANGIOPLASTY WITH STENT PLACEMENT  08/23/2017   CORONARY STENT INTERVENTION N/A 08/23/2017   Procedure: CORONARY STENT INTERVENTION;  Surgeon: Burnell Blanks, MD;  Location: [facility];  Service: Cardiovascular;  Laterality: N/A;   HERNIA REPAIR  04/13/2012   VHR laparoscopic   LEFT HEART CATH AND CORONARY ANGIOGRAPHY N/A 08/23/2017   Procedure: LEFT HEART CATH AND CORONARY ANGIOGRAPHY;  Surgeon: Burnell Blanks, MD;  Location: [facility];  Service: Cardiovascular;  Laterality: N/A;   VENTRAL HERNIA REPAIR  04/13/2012   Procedure: LAPAROSCOPIC VENTRAL HERNIA;  Surgeon: Adin Hector, MD;  Location: [facility];  Service: General;  Laterality: N/A;  laparoscopic repair of incarcerated hernia   Family History:  Family History  Problem Relation Age of Onset   Breast cancer Mother    Hypertension Mother    Diabetes Mother    Cancer Mother        recurrent, metastatic breast cancer.    Diverticulosis Father    Prostate cancer Father    Breast cancer Sister    Cancer Sister        breast cancer, invasive ductal carcinoma in 2022,DCIS at 6 with 4 weeks of radiation, 5 years of Tamoxifen    Pulmonary embolism Brother        recurrent   Heart attack Maternal Grandfather    Cerebral palsy Son    Cancer Niece 10   Osteoporosis Niece    Stroke Neg Hx    Colon cancer Neg Hx    Esophageal cancer Neg Hx    Stomach cancer Neg Hx    Rectal cancer Neg Hx    Family Psychiatric  History: Unremarkable Tobacco Screening:  Social History   Tobacco Use  Smoking Status Never   Passive exposure: Never  Smokeless Tobacco Never    BH Tobacco Counseling     Are you interested in Tobacco Cessation Medications?  N/A, patient does not use tobacco products Counseled patient on smoking cessation:  N/A, patient does not use tobacco products Reason Tobacco Screening Not Completed: No value  filed.       Social History:  Social History   Substance and Sexual Activity  Alcohol Use Yes   Comment: social use- last drink 3 weeks ago     Social History   Substance and Sexual Activity  Drug Use No    Additional Social History:                           Allergies:   Allergies  Allergen Reactions   Latuda [Lurasidone Hcl] Other (See Comments)    PER THE PT CAUSED RESTLESSNESS   Beclomethasone Dipropionate Hives and Other (See Comments)     weight gain   Flexeril [Cyclobenzaprine] Anxiety   Mometasone Furo-Formoterol Fum Hives and Other (See Comments)    weight gain   Sulfonamide Derivatives Hives and Rash   Statins Other (See Comments)    Myalgias, RLS   Lab Results:  Results for orders placed or performed during the hospital encounter of 08/12/22 (from the past 48 hour(s))  CBC with Differential/Platelet     Status: None   Collection Time: 08/12/22  3:00 PM  Result Value Ref Range   WBC 8.1 4.0 - 10.5 K/uL   RBC 4.58 3.87 - 5.11 MIL/uL   Hemoglobin 14.0 12.0 - 15.0 g/dL   HCT 40.8 36.0 - 46.0 %   MCV 89.1 80.0 - 100.0 fL   MCH 30.6 26.0 - 34.0 pg   MCHC 34.3 30.0 - 36.0 g/dL   RDW 13.2 11.5 - 15.5 %   Platelets 314 150 - 400 K/uL   nRBC 0.0 0.0 - 0.2 %   Neutrophils Relative % 77 %   Neutro Abs 6.2 1.7 - 7.7 K/uL   Lymphocytes Relative 15 %   Lymphs Abs 1.2 0.7 - 4.0 K/uL   Monocytes Relative 7 %   Monocytes Absolute 0.6 0.1 - 1.0 K/uL   Eosinophils Relative 0 %   Eosinophils Absolute 0.0 0.0 - 0.5 K/uL   Basophils Relative 0 %   Basophils Absolute 0.0 0.0 - 0.1 K/uL   Immature Granulocytes 1 %   Abs Immature Granulocytes 0.04 0.00 - 0.07 K/uL    Comment: Performed at [facility], [address], [place], [place] [address]  Comprehensive metabolic panel     Status: Abnormal   Collection Time: 08/12/22  3:00 PM  Result Value Ref Range   Sodium 130 (L) 135 - 145 mmol/L   Potassium 3.4 (L) 3.5 - 5.1 mmol/L   Chloride 96 (L) 98 -  111 mmol/L   CO2 24 22 - 32 mmol/L   Glucose, Bld 104 (H) 70 - 99 mg/dL    Comment: Glucose reference range applies only to samples taken after fasting for at least 8 hours.   BUN 12 8 - 23 mg/dL   Creatinine, Ser 0.77 0.44 - 1.00 mg/dL   Calcium 9.1 8.9 - 10.3 mg/dL   Total Protein 6.7 6.5 - 8.1 g/dL   Albumin 4.1 3.5 - 5.0 g/dL   AST 20 15 - 41 U/L   ALT 17 0 - 44 U/L   Alkaline Phosphatase 72 38 - 126 U/L   Total Bilirubin 0.5 0.3 - 1.2 mg/dL   GFR, Estimated >60 >60 mL/min    Comment: (NOTE) Calculated using the CKD-EPI Creatinine Equation (2021)    Anion gap 10 5 - 15    Comment: Performed at [facility] [address] [place], [place] [address]  Magnesium     Status: None   Collection Time: 08/12/22  3:00 PM  Result Value Ref Range   Magnesium 2.3 1.7 - 2.4 mg/dL    Comment: Performed at [facility], [place] [address], [place], [place] [address]  Ethanol     Status: None   Collection Time: 08/12/22  3:00 PM  Result Value Ref Range   Alcohol, Ethyl (B) <10 <10 mg/dL    Comment: (NOTE) Lowest detectable limit for serum alcohol is 10 mg/dL.  For medical purposes only. Performed at [facility], [place] [address], [place], [place] [address]   TSH     Status: None   Collection Time: 08/12/22  3:00 PM  Result Value Ref Range   TSH 1.788 0.350 - 4.500 uIU/mL    Comment: Performed by a 3rd Generation assay with a functional sensitivity of <=0.01 uIU/mL. Performed at [facility], [place] [address] [place], [place] [address]   Prolactin     Status: None   Collection Time: 08/12/22  3:00 PM  Result Value Ref Range   Prolactin 15.4 3.6 - 25.2 ng/mL    Comment: (NOTE) Performed At: [facility] [place], [place] [id number] Rush Farmer MD [id number]   POCT Urine Drug Screen - (I-Screen)     Status: Abnormal   Collection Time: 08/12/22  3:14 PM  Result Value Ref Range   POC Amphetamine UR None Detected NONE DETECTED (Cut  Off Level 1000 ng/mL)   POC Secobarbital (BAR) None Detected NONE DETECTED (Cut Off Level 300 ng/mL)   POC Buprenorphine (BUP) None Detected NONE DETECTED (Cut Off Level 10 ng/mL)   POC Oxazepam (BZO) Positive (A) NONE DETECTED (Cut Off Level 300 ng/mL)   POC Cocaine UR None Detected NONE DETECTED (Cut Off Level 300 ng/mL)   POC Methamphetamine UR None Detected NONE DETECTED (Cut Off Level 1000 ng/mL)   POC Morphine None Detected NONE DETECTED (Cut Off Level 300 ng/mL)   POC Methadone UR None Detected NONE DETECTED (Cut Off Level 300 ng/mL)   POC Oxycodone UR None Detected NONE DETECTED (Cut Off Level 100 ng/mL)   POC Marijuana UR None Detected NONE DETECTED (Cut Off Level 50 ng/mL)  POC SARS Coronavirus 2 Ag     Status: None   Collection Time: 08/12/22  3:18 PM  Result Value Ref Range   SARSCOV2ONAVIRUS 2 AG NEGATIVE NEGATIVE    Comment: (NOTE) SARS-CoV-2 antigen NOT DETECTED.   Negative results are presumptive.  Negative results do not preclude SARS-CoV-2 infection and should not be used as the sole basis for treatment or other patient management decisions, including infection  control decisions, particularly in the presence of clinical signs and  symptoms consistent with COVID-19, or in those who have been in contact with the virus.  Negative results must be combined with clinical observations, patient history, and epidemiological information. The expected result is Negative.  Fact Sheet for Patients: HandmadeRecipes.com.cy  Fact Sheet for Healthcare Providers: FuneralLife.at  This test is not yet approved or cleared by the [place] FDA and  has been authorized for detection and/or diagnosis of SARS-CoV-2 by FDA under an Emergency Use Authorization (EUA).  This EUA will remain in effect (meaning this test can be used) for the duration of  the COV ID-19 declaration under Section 564(b)(1) of the Act, 21 U.S.C. section  360bbb-3(b)(1), unless the authorization is terminated or revoked sooner.    Resp panel by RT-PCR (RSV, Flu A&B, Covid) Anterior Nasal Swab     Status: None   Collection Time: 08/12/22  3:31 PM   Specimen: Anterior Nasal Swab  Result Value Ref Range   SARS Coronavirus 2 by RT PCR NEGATIVE NEGATIVE   Influenza A by PCR NEGATIVE NEGATIVE  Influenza B by PCR NEGATIVE NEGATIVE    Comment: (NOTE) The Xpert Xpress SARS-CoV-2/FLU/RSV plus assay is intended as an aid in the diagnosis of influenza from Nasopharyngeal swab specimens and should not be used as a sole basis for treatment. Nasal washings and aspirates are unacceptable for Xpert Xpress SARS-CoV-2/FLU/RSV testing.  Fact Sheet for Patients: EntrepreneurPulse.com.au  Fact Sheet for Healthcare Providers: IncredibleEmployment.be  This test is not yet approved or cleared by the [place] FDA and has been authorized for detection and/or diagnosis of SARS-CoV-2 by FDA under an Emergency Use Authorization (EUA). This EUA will remain in effect (meaning this test can be used) for the duration of the COVID-19 declaration under Section 564(b)(1) of the Act, 21 U.S.C. section 360bbb-3(b)(1), unless the authorization is terminated or revoked.     Resp Syncytial Virus by PCR NEGATIVE NEGATIVE    Comment: (NOTE) Fact Sheet for Patients: EntrepreneurPulse.com.au  Fact Sheet for Healthcare Providers: IncredibleEmployment.be  This test is not yet approved or cleared by the [place] FDA and has been authorized for detection and/or diagnosis of SARS-CoV-2 by FDA under an Emergency Use Authorization (EUA). This EUA will remain in effect (meaning this test can be used) for the duration of the COVID-19 declaration under Section 564(b)(1) of the Act, 21 U.S.C. section 360bbb-3(b)(1), unless the authorization is terminated or revoked.  Performed at [facility], [place] [address] [place], [place] [address]    *Note: Due to a large number of results and/or encounters for the requested time period, some results have not been displayed. A complete set of results can be found in Results Review.    Blood Alcohol level:  Lab Results  Component Value Date   ETH <10 123456    Metabolic Disorder Labs:  Lab Results  Component Value Date   HGBA1C 5.6 05/26/2022   MPG 117 10/22/2008   Lab Results  Component Value Date   PROLACTIN 15.4 08/12/2022   Lab Results  Component Value Date   CHOL 208 (H) 05/26/2022   TRIG 113.0 05/26/2022   HDL 74.00 05/26/2022   CHOLHDL 3 05/26/2022   VLDL 22.6 05/26/2022   LDLCALC 111 (H) 05/26/2022   LDLCALC 95 07/06/2021    Current Medications: Current Facility-Administered Medications  Medication Dose Route Frequency Provider Last Rate Last Admin   acetaminophen (TYLENOL) tablet 650 mg  650 mg Oral Q6H PRN Lucky Rathke, FNP       alum & mag hydroxide-simeth (MAALOX/MYLANTA) 200-200-20 MG/5ML suspension 30 mL  30 mL Oral Q4H PRN Lucky Rathke, FNP       clonazePAM Bobbye [place]) tablet 0.5 mg  0.5 mg Oral BID PRN Lucky Rathke, FNP   0.5 mg at 08/13/22 0921   famotidine (PEPCID) tablet 20 mg  20 mg Oral Daily Lucky Rathke, FNP   20 mg at 08/13/22 0921   ipratropium-albuterol (DUONEB) 0.5-2.5 (3) MG/3ML nebulizer solution 3 mL  3 mL Nebulization Q6H PRN Lucky Rathke, FNP       magnesium hydroxide (MILK OF MAGNESIA) suspension 30 mL  30 mL Oral Daily PRN Lucky Rathke, FNP       nitroGLYCERIN (NITROSTAT) SL tablet 0.4 mg  0.4 mg Sublingual Q5 min PRN Lucky Rathke, FNP       traZODone (DESYREL) tablet 100 mg  100 mg Oral QHS PRN Anette Riedel M, NP   100 mg at 08/12/22 2115   venlafaxine XR (EFFEXOR-XR) 24 hr capsule 225 mg  225 mg  Oral Q breakfast Lucky Rathke, FNP   225 mg at 08/13/22 [id number]   PTA Medications: Medications Prior to Admission  Medication Sig Dispense Refill Last Dose   albuterol  (VENTOLIN HFA) 108 (90 Base) MCG/ACT inhaler Inhale 1-2 puffs into the lungs every 6 (six) hours as needed. 8 g 2    aspirin EC 81 MG tablet Take 81 mg by mouth daily. Swallow whole.      Benralizumab (FASENRA PEN) 30 MG/ML SOAJ Inject 1 mL (30 mg total) into the skin every 8 (eight) weeks. 1 mL 2    Bioflavonoid Products (ESTER C PO) Take 500 mg by mouth at bedtime.      budesonide (ENTOCORT EC) 3 MG 24 hr capsule Take 3 caps a day for 4 weeks then take 2 caps daily for 4 weeks then take 1 capsule daily for 4 weeks. (Patient taking differently: Take 3-9 mg by mouth See admin instructions. Take 3 caps a day for 4 weeks then take 2 caps daily for 4 weeks then take 1 capsule daily for 4 weeks starting on 06/27/22.) 222 capsule 0    Calcium-Magnesium-Vitamin D (CALCIUM MAGNESIUM PO) Take 1 tablet by mouth at bedtime.      clonazePAM (KLONOPIN) 0.5 MG tablet Take 0.5 mg by mouth daily as needed for anxiety.      Evolocumab (REPATHA SURECLICK) [id number] MG/ML SOAJ INJECT 1 PEN INTO THE SKIN EVERY 14 DAYS (Patient taking differently: Inject 140 mg into the skin every 21 ( twenty-one) days.) 6 mL 3    famotidine (PEPCID) 20 MG tablet TAKE 1 TABLET BY MOUTH TWICE A DAY (Patient taking differently: Take 20 mg by mouth daily.) 60 tablet 2    fexofenadine (ALLEGRA) 180 MG tablet Take 180 mg by mouth daily as needed for allergies or rhinitis.      fluticasone (FLONASE) 50 MCG/ACT nasal spray Place 1 spray into both nostrils daily as needed for allergies or rhinitis.      ipratropium-albuterol (DUONEB) 0.5-2.5 (3) MG/3ML SOLN Take 3 mLs by nebulization every 6 (six) hours as needed. (Patient taking differently: Take 3 mLs by nebulization every 6 (six) hours as needed (For shortness of breath or wheezing).) 120 mL 5    LORazepam (ATIVAN) 1 MG tablet Take 1 tablet (1 mg total) by mouth at bedtime as needed for anxiety or sleep. (Patient taking differently: Take 1 mg by mouth at bedtime.) 30 tablet 0    nitroGLYCERIN  (NITROSTAT) 0.4 MG SL tablet PLACE 1 TABLET UNDER THE TONGUE EVERY 5 MINUTES AS NEEDED FOR CHEST PAIN. (Patient taking differently: 0.4 mg every 5 (five) minutes as needed for chest pain.) 25 tablet 8    traZODone (DESYREL) 50 MG tablet Take 50-100 tablets by mouth at bedtime.      venlafaxine XR (EFFEXOR XR) 75 MG 24 hr capsule Take 3 capsules (225 mg total) by mouth daily with breakfast. 90 capsule 1    VITAMIN D PO Take 5,000 Units by mouth at bedtime.       Musculoskeletal: Strength & Muscle Tone: within normal limits Gait & Station: normal Patient leans: N/A            Psychiatric Specialty Exam:  Presentation  General Appearance:  Appropriate for Environment; Casual  Eye Contact: Good  Speech: Clear and Coherent; Normal Rate  Speech Volume: Normal  Handedness: Right   Mood and Affect  Mood: Anxious; Depressed  Affect: Congruent   Thought Process  Thought Processes: Coherent; Goal Directed; Linear  Duration of Psychotic Symptoms:N/A Past Diagnosis of Schizophrenia or Psychoactive disorder: No  Descriptions of Associations:Intact  Orientation:Full (Time, Place and Person)  Thought Content:Logical; WDL  Hallucinations:Hallucinations: None  Ideas of Reference:None  Suicidal Thoughts:Suicidal Thoughts: Yes, Passive SI Passive Intent and/or Plan: Without Intent; Without Plan  Homicidal Thoughts:Homicidal Thoughts: No   Sensorium  Memory: Immediate Good; Recent Fair  Judgment: Fair  Insight: Present   Executive Functions  Concentration: Good  Attention Span: Good  Recall: Good  Fund of Knowledge: Good  Language: Good   Psychomotor Activity  Psychomotor Activity: Psychomotor Activity: Normal   Assets  Assets: Communication Skills; Desire for Improvement; Housing; Leisure Time; Resilience; Social Support   Sleep  Sleep: Sleep: Fair Number of Hours of Sleep: 7    Physical Exam: Physical  Exam Constitutional:      Appearance: Normal appearance.  HENT:     Head: Normocephalic and atraumatic.     Mouth/Throat:     Pharynx: Oropharynx is clear.  Eyes:     Pupils: Pupils are equal, round, and reactive to light.  Cardiovascular:     Rate and Rhythm: Normal rate and regular rhythm.  Pulmonary:     Effort: Pulmonary effort is normal.     Breath sounds: Normal breath sounds.  Abdominal:     General: Abdomen is flat.     Palpations: Abdomen is soft.  Musculoskeletal:        General: Normal range of motion.  Skin:    General: Skin is warm and dry.  Neurological:     General: No focal deficit present.     Mental Status: She is alert. Mental status is at baseline.  Psychiatric:        Attention and Perception: Attention and perception normal.        Mood and Affect: Mood is anxious and depressed. Affect is flat.        Speech: Speech normal.        Behavior: Behavior is cooperative.        Thought Content: Thought content normal.        Cognition and Memory: Cognition and memory normal.        Judgment: Judgment normal.    Review of Systems  Constitutional: Negative.   HENT: Negative.    Eyes: Negative.   Respiratory: Negative.    Cardiovascular: Negative.   Gastrointestinal: Negative.   Genitourinary: Negative.   Musculoskeletal: Negative.   Skin: Negative.   Neurological: Negative.   Endo/Heme/Allergies: Negative.   Psychiatric/Behavioral:  Positive for depression. The patient is nervous/anxious and has insomnia.    Blood pressure (!) 135/90, pulse 90, temperature 98.7 F (37.1 C), temperature source Oral, resp. rate 18, height '5\' 1"'$  (1.549 m), weight 68.3 kg, SpO2 100 %. Body mass index is 28.44 kg/m.  Treatment Plan Summary: Daily contact with patient to assess and evaluate symptoms and progress in treatment, Medication management, and Plan add pindolol 5 mg/day to help with akathisia and SSRI poop out.  Start Remeron 15 mg at bedtime.  Observation  Level/Precautions:  15 minute checks  Laboratory:  CBC Chemistry Profile  Psychotherapy:    Medications:    Consultations:    Discharge Concerns:    Estimated LOS:  Other:     Physician Treatment Plan for Primary Diagnosis: Passive suicidal ideations Long Term Goal(s): Improvement in symptoms so as ready for discharge  Short Term Goals: Ability to identify changes in lifestyle to reduce recurrence of condition will improve, Ability to verbalize feelings  will improve, Ability to disclose and discuss suicidal ideas, Ability to demonstrate self-control will improve, Ability to identify and develop effective coping behaviors will improve, Ability to maintain clinical measurements within normal limits will improve, Compliance with prescribed medications will improve, and Ability to identify triggers associated with substance abuse/mental health issues will improve  Physician Treatment Plan for Secondary Diagnosis: Principal Problem:   Passive suicidal ideations   I certify that inpatient services furnished can reasonably be expected to improve the patient's condition.    Parks Ranger, DO 2/24/202412:06 PM

## 2022-08-13 NOTE — BH Assessment (Signed)
  1905 Received patient sitting in the day room watching TV. She is alert and oriented x 4. She is visiting with peers.  2030 Approached patient with some information on care facilities for her son. She expressed being very concerned about his care. Patient voiced that she and her husband did not think her son would live that long and that they never imagined that they would have to decide on her son's care. Will continue to monitor patient for safety.  2330 Patient is medication compliant. Patient reporting that she is not able to fall asleep on with just the Remeron 15 mg . She is no requesting Trazodone 100 mg for complaint of insomnia.  0300 Patient has remained asleep after receiving a Trazodone 100 mg PO.  Will continue to monitor patient for safety.  0630 Patient is up for a drink of cold, sat in the chair for a few monuments. She returned, Will continue to monitor patient for safety.

## 2022-08-13 NOTE — Group Note (Signed)
LCSW Group Therapy Note   Group Date: 08/13/2022 Start Time: 1330 End Time: 1430   Type of Therapy and Topic:  Group Therapy: Challenging Core Beliefs  Participation Level:  None  Description of Group:  Patients were educated about core beliefs and asked to identify one harmful core belief that they have. Patients were asked to explore from where those beliefs originate. Patients were asked to discuss how those beliefs make them feel and the resulting behaviors of those beliefs. They were then be asked if those beliefs are true and, if so, what evidence they have to support them. Lastly, group members were challenged to replace those negative core beliefs with helpful beliefs.   Therapeutic Goals:   1. Patient will identify harmful core beliefs and explore the origins of such beliefs. 2. Patient will identify feelings and behaviors that result from those core beliefs. 3. Patient will discuss whether such beliefs are true. 4.  Patient will replace harmful core beliefs with helpful ones.  Summary of Patient Progress:  Victoria [name] attended and actively listened throughout group while processing and exploring how core beliefs are formed and how they impact thoughts, feelings, and behaviors. Patient proved open to input from peers and feedback from [place].   Therapeutic Modalities: Cognitive Behavioral Therapy; Solution-Focused Therapy   Vassie Moselle, LCSW 08/13/2022  2:31 PM

## 2022-08-13 NOTE — Plan of Care (Signed)
Pt endorses anxiety/depression at this time. Pt denies SI/HI/AVH or pain at this time. Pt is calm and cooperative. Pt is medication compliant. Pt provided with support and encouragement. Pt monitored q15 minutes for safety per unit policy. Plan of care ongoing.   Pt shares that the Klonopin use to work for her when she was first started on the medication and that she use to have good days on it however it has not been working for her lately. Pt reports that she meant to share this information with the MD but forgot. Pt is afraid things will be like this forever. Pt worried about getting on the right medication that will help her and about her son's condition. Pt shares that she wishes they had stayed in [place] and gotten him on the waiting list at a facility there where he could be cared for. She worries about his future care.   Problem: Education: Goal: Knowledge of General Education information will improve Description: Including pain rating scale, medication(s)/side effects and non-pharmacologic comfort measures Outcome: Progressing   Problem: Coping: Goal: Level of anxiety will decrease Outcome: Not Progressing

## 2022-08-14 DIAGNOSIS — R45851 Suicidal ideations: Secondary | ICD-10-CM | POA: Diagnosis not present

## 2022-08-14 DIAGNOSIS — F332 Major depressive disorder, recurrent severe without psychotic features: Principal | ICD-10-CM | POA: Insufficient documentation

## 2022-08-14 MED ORDER — TRAZODONE HCL 100 MG PO TABS
100.0000 mg | ORAL_TABLET | Freq: Every day | ORAL | Status: DC
  Administered 2022-08-14: 100 mg via ORAL
  Filled 2022-08-14: qty 1

## 2022-08-14 MED ORDER — TROLAMINE SALICYLATE 10 % EX CREA: TOPICAL_CREAM | Freq: Two times a day (BID) | CUTANEOUS | Status: DC | PRN

## 2022-08-14 NOTE — BHH Counselor (Signed)
Adult Comprehensive Assessment  Patient ID: Victoria [name], female   DOB: 31-Jan-1956, 67 y.o.   MRN: [id number]  Information Source: Information source: Patient  Current Stressors:  Patient states their primary concerns and needs for treatment are:: "Medications have not been working. I've had debilitating depression and anxiety" Patient states their goals for this hospitilization and ongoing recovery are:: "New medications" Educational / Learning stressors: Denies stressor Employment / Job issues: Denies stressor Family Relationships: Yes, have 38y.o. son who has quadriplegia cerebral palsy and is 24/7 total care. She shares she has constant worry of where pt is going to end up living and who is going to be providing care for him. Financial / Lack of resources (include bankruptcy): Denies stressor Housing / Lack of housing: Denies stressor Physical health (include injuries & life threatening diseases): Has asthma and scoliosis Social relationships: Denies stressor Substance abuse: Denies stressor Bereavement / Loss: Denies stressor  Living/Environment/Situation:  Living Arrangements: Spouse/significant other, Children Living conditions (as described by patient or guardian): Single family home, has a a pool and nice outdoor area Who else lives in the home?: Husband and 68y.o. son How long has patient lived in current situation?: 20 years What is atmosphere in current home: Comfortable, Loving, Supportive  Family History:  Marital status: Married Number of Years Married: 73 What types of issues is patient dealing with in the relationship?: Have been together for almost 77 years. Additional relationship information: n/a Are you sexually active?: No What is your sexual orientation?: Heterosexual Has your sexual activity been affected by drugs, alcohol, medication, or emotional stress?: Denies Does patient have children?: Yes How many children?: 1 How is patient's relationship with  their children?: Good, adult son has dx of cerebral palsy on feeding tube and is dependent on parents for total care  Childhood History:  By whom was/is the patient raised?: Both parents Additional childhood history information: Shares she had severe asthma as a child and was in t he hospital 3-4x a year. States her and the family spent a lot of time together and traveled often Description of patient's relationship with caregiver when they were a child: Good, felt loved Patient's description of current relationship with people who raised him/her: Both parents deceased How were you disciplined when you got in trouble as a child/adolescent?: "I don't remember" Does patient have siblings?: Yes Number of Siblings: 2 Description of patient's current relationship with siblings: Has a brother who is deceased. Has a sister whom she has a good relationship with Did patient suffer any verbal/emotional/physical/sexual abuse as a child?: No Did patient suffer from severe childhood neglect?: No Has patient ever been sexually abused/assaulted/raped as an adolescent or adult?: No Was the patient ever a victim of a crime or a disaster?: No Witnessed domestic violence?: No Has patient been affected by domestic violence as an adult?: No  Education:  Highest grade of school patient has completed: Associates degree Currently a student?: No Learning disability?: No  Employment/Work Situation:   Employment Situation: Retired Social research officer, government has Been Impacted by Current Illness: No What is the Longest Time Patient has Held a Job?: 6 years Where was the Patient Employed at that Time?: [place] office Has Patient ever Been in the [employer]?: No  Financial Resources:   Museum/gallery curator resources: [employer], 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 If attempted suicide, did drugs/alcohol play a role in this?: No Alcohol/Substance Abuse Treatment Hx: Denies past history Has alcohol/substance abuse ever caused legal problems?: No  Social Support System:   Patient's Community Support System: Good Describe Community Support System: Friends, sister, husband Type of faith/religion: Victoria [name] How does patient's faith help to cope with current illness?: Read devotionals and bible, journal, pray, go to church  Leisure/Recreation:   Do You Have Hobbies?: Yes Leisure and Hobbies: reading, swimming, gardening, walking, used to like crafting however has not done this since son was born  Strengths/Needs:   What is the patient's perception of their strengths?: Considerate, connecting with friends Patient states they can use these personal strengths during their treatment to contribute to their recovery: Yes Patient states these barriers may affect/interfere with their treatment: None Patient states these barriers may affect their return to the community: None Other important information patient would like considered in planning for their treatment: None  Discharge Plan:   Currently receiving community mental health services: Yes (From Whom) (Dr. Nelida Gores for medication management. Therapy at [facility]) Patient states concerns and preferences for aftercare planning are: Pt would like to continue with her current services. She plans to begin attending a Wellness Group at her therapist office. Patient states they will know when they are safe and ready for discharge when: Yes, feels ready now Does patient have access to transportation?: Yes Does patient have financial barriers related to discharge medications?: No Patient description of barriers related to discharge medications: n/a Will patient be returning to same living situation after  discharge?: Yes  Summary/Recommendations:   Summary and Recommendations (to be completed by the evaluator): Victoria Fossa was admitted due to depression and suicidal ideations. Pt has a hx of depression and anxiety. Recent stressors include care for son and fear of the future for son. Pt currently sees Dr Nelida Gores in [place] for medication management and has a Transport planner at [facility] in [place]. While here, Iriana Dahm can benefit from crisis stabilization, medication management, therapeutic milieu, and referrals for services.  Hilma Steinhilber A Beyza Bellino. 08/14/2022

## 2022-08-14 NOTE — Progress Notes (Signed)
   08/14/22 0706  Psych Admission Type (Psych Patients Only)  Admission Status Voluntary  Psychosocial Assessment  Patient Complaints Anxiety;Depression  Eye Contact Brief  Facial Expression Anxious  Affect Anxious  Speech Logical/coherent  Interaction Isolative  Motor Activity Slow  Appearance/Hygiene Unremarkable  Behavior Characteristics Cooperative  Mood Anxious;Depressed  Thought Process  Coherency WDL  Content WDL  Delusions None reported or observed  Perception WDL  Hallucination None reported or observed  Judgment WDL  Confusion None  Danger to Self  Current suicidal ideation? Denies  Danger to Others  Danger to Others None reported or observed

## 2022-08-14 NOTE — Group Note (Signed)
Date:  08/14/2022 Time:  5:39 PM  Group Topic/Focus:  Overcoming Stress:   The focus of this group is to define stress and help patients assess their triggers. Personal Choices and Values:   The focus of this group is to help patients assess and explore the importance of values in their lives, how their values affect their decisions, how they express their values and what opposes their expression. Self Care:   The focus of this group is to help patients understand the importance of self-care in order to improve or restore emotional, physical, spiritual, interpersonal, and financial health.    Participation Level:  Active  Participation Quality:  Appropriate  Affect:  Appropriate  Cognitive:  Alert  Insight: Appropriate  Engagement in Group:  Engaged  Modes of Intervention:  Discussion and Socialization  Additional Comments:  na  Nolon Bussing 08/14/2022, 5:39 PM

## 2022-08-14 NOTE — Plan of Care (Signed)
  Problem: Education: Goal: Knowledge of General Education information will improve Description: Including pain rating scale, medication(s)/side effects and non-pharmacologic comfort measures Outcome: Progressing   Problem: Health Behavior/Discharge Planning: Goal: Ability to manage health-related needs will improve Outcome: Progressing   Problem: Clinical Measurements: Goal: Ability to maintain clinical measurements within normal limits will improve Outcome: Progressing Goal: Will remain free from infection Outcome: Progressing Goal: Diagnostic test results will improve Outcome: Progressing Goal: Respiratory complications will improve Outcome: Progressing Goal: Cardiovascular complication will be avoided Outcome: Progressing   Problem: Nutrition: Goal: Adequate nutrition will be maintained Outcome: Progressing   Problem: Coping: Goal: Level of anxiety will decrease Outcome: Progressing   Problem: Elimination: Goal: Will not experience complications related to bowel motility Outcome: Progressing Goal: Will not experience complications related to urinary retention Outcome: Progressing   Problem: Pain Managment: Goal: General experience of comfort will improve Outcome: Progressing   Problem: Skin Integrity: Goal: Risk for impaired skin integrity will decrease Outcome: Progressing   

## 2022-08-14 NOTE — Progress Notes (Signed)
D: Patient alert and oriented times 4. Pt denies SI, HI, AVH. Pt rates pain  7/10 to lower back- PRN Tylenol given. Pt endorses feelings of mild anxiety and depression. No other concerns noted at this time.   A: Pt provided support and encouragement throughout the day. Scheduled medications given as prescribed. Takes medications whole without issue.    R: Pt remains safe on the unit with Q15 min safety checks in place. Will continue to monitor for changes.

## 2022-08-14 NOTE — Progress Notes (Signed)
[facility] MD Progress Note  08/14/2022 1:13 PM Victoria [name]  MRN:  [id number] Subjective: Victoria [name] is seen on rounds.  So far she has no complaints about the medications.  She states that she had trouble sleeping last night and ended up taking the trazodone.  I told her that I would just go ahead and schedule it.  She says that her blood pressure is a little bit lower but it tends to run lower but she thinks that the pindolol is helping with her restlessness. 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 LHC 6/16:  oOM1 60, pRCA 25 >> med Rx b. cath 3/19 2nd OM with 95% stenosis s/p synergy DES & anomalous RCA   CAP (community acquired pneumonia) 2016; 06/07/2016   Chronic bronchitis (HCC)    Chronic lower back pain    Complication of anesthesia    "think I have a hard time waking up from it"   COPD (chronic obstructive pulmonary disease) ([place])    Depression    mild   Diverticulitis    Diverticulosis    GERD (gastroesophageal reflux disease)    H/O hiatal hernia    Headache    "weekly" (08/23/2017)   History of echocardiogram    Echo 6/16:  Mod LVH, EF 60-65%, no RWMA, Gr 1 DD, trivial MR, normal LA size.   Hyperglycemia 11/20/2015   Hyperlipidemia, mixed 09/11/2007   Qualifier: Diagnosis of  By: Jerold Coombe   Did not tolerate Lipitor, zocor, Lovastatin, Pravastatin, Livalo, Crestor even low dose    IBS (irritable bowel syndrome)    Maxillary sinusitis    Normal cardiac stress test  11/2011   No evidence of ischemia or infarct.   Calculated ejection fraction 72%.   Obesity    OSA (obstructive sleep apnea) 02/2012   has stopped using  cpap   Osteoarthritis    Osteoporosis    Pneumonia 11/2011   "before 2013 I hadn't had pneumonia since I was a child" (04/13/2012)   Pulmonary nodules    S/P angioplasty with stent 08/23/17 ostial 2nd OM with DES synnergy 08/24/2017   Schatzki's ring     Past Surgical History:  Procedure Laterality Date   APPENDECTOMY  1989   CARDIAC CATHETERIZATION N/A 11/25/2014   Procedure: Right/Left Heart Cath and Coronary Angiography;  Surgeon: Belva Crome, MD;  Location: [facility];  Service: Cardiovascular;  Laterality: N/A;   [place]   COLONOSCOPY  03/2022   CORONARY ANGIOPLASTY WITH STENT PLACEMENT  08/23/2017   CORONARY STENT INTERVENTION N/A 08/23/2017   Procedure: CORONARY STENT INTERVENTION;  Surgeon: Burnell Blanks, MD;  Location: [facility];  Service: Cardiovascular;  Laterality: N/A;   HERNIA REPAIR  04/13/2012   VHR laparoscopic   LEFT HEART CATH AND CORONARY ANGIOGRAPHY N/A 08/23/2017   Procedure: LEFT HEART CATH AND CORONARY ANGIOGRAPHY;  Surgeon: Burnell Blanks, MD;  Location: [facility];  Service: Cardiovascular;  Laterality: N/A;   VENTRAL  HERNIA REPAIR  04/13/2012   Procedure: LAPAROSCOPIC VENTRAL HERNIA;  Surgeon: Adin Hector, MD;  Location: [facility];  Service: General;  Laterality: N/A;  laparoscopic repair of incarcerated hernia   Family History:  Family History  Problem Relation Age of Onset   Breast cancer Mother    Hypertension Mother    Diabetes Mother    Cancer Mother        recurrent, metastatic breast cancer.    Diverticulosis Father    Prostate cancer Father    Breast cancer Sister    Cancer Sister        breast cancer, invasive ductal carcinoma in 2022,DCIS at 63 with 4 weeks of radiation, 5 years of Tamoxifen    Pulmonary embolism Brother         recurrent   Heart attack Maternal Grandfather    Cerebral palsy Son    Cancer Niece 31   Osteoporosis Niece    Stroke Neg Hx    Colon cancer Neg Hx    Esophageal cancer Neg Hx    Stomach cancer Neg Hx    Rectal cancer Neg Hx    Family Psychiatric  History: Unremarkable Social History:  Social History   Substance and Sexual Activity  Alcohol Use Yes   Comment: social use- last drink 3 weeks ago     Social History   Substance and Sexual Activity  Drug Use No    Social History   Socioeconomic History   Marital status: Married    Spouse name: Not on file   Number of children: 1   Years of education: 14   Highest education level: Associate degree: academic program  Occupational History   Occupation: Disabled   Tobacco Use   Smoking status: Never    Passive exposure: Never   Smokeless tobacco: Never  Vaping Use   Vaping Use: Never used  Substance and Sexual Activity   Alcohol use: Yes    Comment: social use- last drink 3 weeks ago   Drug use: No   Sexual activity: Yes    Comment: gluten free, lives with husband and son with CP quadriplegia  Other Topics Concern   Not on file  Social History Narrative   Cares for son with cerebral palsy.    Lives at home with her husband and son.   Right-handed.   2 cups caffeine per day.   One story home   Social Determinants of Health   Financial Resource Strain: Low Risk  (10/19/2021)   Overall Financial Resource Strain (CARDIA)    Difficulty of Paying Living Expenses: Not hard at all  Food Insecurity: No Food Insecurity (08/12/2022)   Hunger Vital Sign    Worried About Running Out of Food in the Last Year: Never true    Ran Out of Food in the Last Year: Never true  Transportation Needs: No Transportation Needs (08/12/2022)   PRAPARE - Hydrologist (Medical): No    Lack of Transportation (Non-Medical): No  Physical Activity: Insufficiently Active (10/19/2021)   Exercise Vital Sign    Days of  Exercise per Week: 7 days    Minutes of Exercise per Session: 20 min  Stress: Stress Concern Present (10/19/2021)   [place]    Feeling of Stress : Rather much  Social Connections: Moderately Integrated (10/19/2021)   Social Connection and Isolation Panel [NHANES]    Frequency of Communication with Friends and Family: More than  three times a week    Frequency of Social Gatherings with Friends and Family: Once a week    Attends Religious Services: More than 4 times per year    Active Member of [employer] or Organizations: No    Attends Music therapist: Never    Marital Status: Married   Additional Social History:                         Sleep: Fair  Appetite:  Good  Current Medications: Current Facility-Administered Medications  Medication Dose Route Frequency Provider Last Rate Last Admin   acetaminophen (TYLENOL) tablet 650 mg  650 mg Oral Q6H PRN Lucky Rathke, FNP   650 mg at 08/14/22 1208   albuterol (VENTOLIN HFA) 108 (90 Base) MCG/ACT inhaler 1-2 puff  1-2 puff Inhalation Q6H PRN Parks Ranger, DO   2 puff at 08/13/22 2256   alum & mag hydroxide-simeth (MAALOX/MYLANTA) 200-200-20 MG/5ML suspension 30 mL  30 mL Oral Q4H PRN Lucky Rathke, FNP       clonazePAM Bobbye [place]) tablet 0.5 mg  0.5 mg Oral BID PRN Lucky Rathke, FNP   0.5 mg at 08/14/22 0902   famotidine (PEPCID) tablet 20 mg  20 mg Oral Daily Lucky Rathke, FNP   20 mg at 08/14/22 0836   ipratropium-albuterol (DUONEB) 0.5-2.5 (3) MG/3ML nebulizer solution 3 mL  3 mL Nebulization Q6H PRN Lucky Rathke, FNP       magnesium hydroxide (MILK OF MAGNESIA) suspension 30 mL  30 mL Oral Daily PRN Lucky Rathke, FNP       mirtazapine (REMERON) tablet 15 mg  15 mg Oral QHS Parks Ranger, DO   15 mg at 08/13/22 2208   nitroGLYCERIN (NITROSTAT) SL tablet 0.4 mg  0.4 mg Sublingual Q5 min PRN Lucky Rathke, FNP       pindolol (VISKEN)  tablet 5 mg  5 mg Oral QPC breakfast Parks Ranger, DO   5 mg at 08/14/22 [id number]   traZODone (DESYREL) tablet 100 mg  100 mg Oral QHS Parks Ranger, DO       trolamine salicylate (ASPERCREME) 10 % cream   Topical BID PRN Parks Ranger, DO       venlafaxine XR (EFFEXOR-XR) 24 hr capsule 225 mg  225 mg Oral Q breakfast Lucky Rathke, FNP   225 mg at 08/14/22 [id number]    Lab Results:  Results for orders placed or performed during the hospital encounter of 08/12/22 (from the past 48 hour(s))  CBC with Differential/Platelet     Status: None   Collection Time: 08/12/22  3:00 PM  Result Value Ref Range   WBC 8.1 4.0 - 10.5 K/uL   RBC 4.58 3.87 - 5.11 MIL/uL   Hemoglobin 14.0 12.0 - 15.0 g/dL   HCT 40.8 36.0 - 46.0 %   MCV 89.1 80.0 - 100.0 fL   MCH 30.6 26.0 - 34.0 pg   MCHC 34.3 30.0 - 36.0 g/dL   RDW 13.2 11.5 - 15.5 %   Platelets 314 150 - 400 K/uL   nRBC 0.0 0.0 - 0.2 %   Neutrophils Relative % 77 %   Neutro Abs 6.2 1.7 - 7.7 K/uL   Lymphocytes Relative 15 %   Lymphs Abs 1.2 0.7 - 4.0 K/uL   Monocytes Relative 7 %   Monocytes Absolute 0.6 0.1 - 1.0 K/uL   Eosinophils Relative 0 %  Eosinophils Absolute 0.0 0.0 - 0.5 K/uL   Basophils Relative 0 %   Basophils Absolute 0.0 0.0 - 0.1 K/uL   Immature Granulocytes 1 %   Abs Immature Granulocytes 0.04 0.00 - 0.07 K/uL    Comment: Performed at [facility] [address] [place], [place] [address]  Comprehensive metabolic panel     Status: Abnormal   Collection Time: 08/12/22  3:00 PM  Result Value Ref Range   Sodium 130 (L) 135 - 145 mmol/L   Potassium 3.4 (L) 3.5 - 5.1 mmol/L   Chloride 96 (L) 98 - 111 mmol/L   CO2 24 22 - 32 mmol/L   Glucose, Bld 104 (H) 70 - 99 mg/dL    Comment: Glucose reference range applies only to samples taken after fasting for at least 8 hours.   BUN 12 8 - 23 mg/dL   Creatinine, Ser 0.77 0.44 - 1.00 mg/dL   Calcium 9.1 8.9 - 10.3 mg/dL   Total Protein 6.7 6.5 - 8.1 g/dL    Albumin 4.1 3.5 - 5.0 g/dL   AST 20 15 - 41 U/L   ALT 17 0 - 44 U/L   Alkaline Phosphatase 72 38 - 126 U/L   Total Bilirubin 0.5 0.3 - 1.2 mg/dL   GFR, Estimated >60 >60 mL/min    Comment: (NOTE) Calculated using the CKD-EPI Creatinine Equation (2021)    Anion gap 10 5 - 15    Comment: Performed at [facility] [address] [place], [place] [address]  Magnesium     Status: None   Collection Time: 08/12/22  3:00 PM  Result Value Ref Range   Magnesium 2.3 1.7 - 2.4 mg/dL    Comment: Performed at [facility], [place] [address] [place], [place] [address]  Ethanol     Status: None   Collection Time: 08/12/22  3:00 PM  Result Value Ref Range   Alcohol, Ethyl (B) <10 <10 mg/dL    Comment: (NOTE) Lowest detectable limit for serum alcohol is 10 mg/dL.  For medical purposes only. Performed at [facility], [place] [address] [place], [place] [address]   TSH     Status: None   Collection Time: 08/12/22  3:00 PM  Result Value Ref Range   TSH 1.788 0.350 - 4.500 uIU/mL    Comment: Performed by a 3rd Generation assay with a functional sensitivity of <=0.01 uIU/mL. Performed at [facility], [place] [address], [place], [place] [address]   Prolactin     Status: None   Collection Time: 08/12/22  3:00 PM  Result Value Ref Range   Prolactin 15.4 3.6 - 25.2 ng/mL    Comment: (NOTE) Performed At: [facility] [place], [place] [id number] Rush Farmer MD [id number]   POCT Urine Drug Screen - (I-Screen)     Status: Abnormal   Collection Time: 08/12/22  3:14 PM  Result Value Ref Range   POC Amphetamine UR None Detected NONE DETECTED (Cut Off Level 1000 ng/mL)   POC Secobarbital (BAR) None Detected NONE DETECTED (Cut Off Level 300 ng/mL)   POC Buprenorphine (BUP) None Detected NONE DETECTED (Cut Off Level 10 ng/mL)   POC Oxazepam (BZO) Positive (A) NONE DETECTED (Cut Off Level 300 ng/mL)   POC Cocaine UR None Detected NONE DETECTED (Cut Off  Level 300 ng/mL)   POC Methamphetamine UR None Detected NONE DETECTED (Cut Off Level 1000 ng/mL)   POC Morphine None Detected NONE  DETECTED (Cut Off Level 300 ng/mL)   POC Methadone UR None Detected NONE DETECTED (Cut Off Level 300 ng/mL)   POC Oxycodone UR None Detected NONE DETECTED (Cut Off Level 100 ng/mL)   POC Marijuana UR None Detected NONE DETECTED (Cut Off Level 50 ng/mL)  POC SARS Coronavirus 2 Ag     Status: None   Collection Time: 08/12/22  3:18 PM  Result Value Ref Range   SARSCOV2ONAVIRUS 2 AG NEGATIVE NEGATIVE    Comment: (NOTE) SARS-CoV-2 antigen NOT DETECTED.   Negative results are presumptive.  Negative results do not preclude SARS-CoV-2 infection and should not be used as the sole basis for treatment or other patient management decisions, including infection  control decisions, particularly in the presence of clinical signs and  symptoms consistent with COVID-19, or in those who have been in contact with the virus.  Negative results must be combined with clinical observations, patient history, and epidemiological information. The expected result is Negative.  Fact Sheet for Patients: HandmadeRecipes.com.cy  Fact Sheet for Healthcare Providers: FuneralLife.at  This test is not yet approved or cleared by the [place] FDA and  has been authorized for detection and/or diagnosis of SARS-CoV-2 by FDA under an Emergency Use Authorization (EUA).  This EUA will remain in effect (meaning this test can be used) for the duration of  the COV ID-19 declaration under Section 564(b)(1) of the Act, 21 U.S.C. section 360bbb-3(b)(1), unless the authorization is terminated or revoked sooner.    Resp panel by RT-PCR (RSV, Flu A&B, Covid) Anterior Nasal Swab     Status: None   Collection Time: 08/12/22  3:31 PM   Specimen: Anterior Nasal Swab  Result Value Ref Range   SARS Coronavirus 2 by RT PCR NEGATIVE NEGATIVE   Influenza A  by PCR NEGATIVE NEGATIVE   Influenza B by PCR NEGATIVE NEGATIVE    Comment: (NOTE) The Xpert Xpress SARS-CoV-2/FLU/RSV plus assay is intended as an aid in the diagnosis of influenza from Nasopharyngeal swab specimens and should not be used as a sole basis for treatment. Nasal washings and aspirates are unacceptable for Xpert Xpress SARS-CoV-2/FLU/RSV testing.  Fact Sheet for Patients: EntrepreneurPulse.com.au  Fact Sheet for Healthcare Providers: IncredibleEmployment.be  This test is not yet approved or cleared by the [place] FDA and has been authorized for detection and/or diagnosis of SARS-CoV-2 by FDA under an Emergency Use Authorization (EUA). This EUA will remain in effect (meaning this test can be used) for the duration of the COVID-19 declaration under Section 564(b)(1) of the Act, 21 U.S.C. section 360bbb-3(b)(1), unless the authorization is terminated or revoked.     Resp Syncytial Virus by PCR NEGATIVE NEGATIVE    Comment: (NOTE) Fact Sheet for Patients: EntrepreneurPulse.com.au  Fact Sheet for Healthcare Providers: IncredibleEmployment.be  This test is not yet approved or cleared by the [place] FDA and has been authorized for detection and/or diagnosis of SARS-CoV-2 by FDA under an Emergency Use Authorization (EUA). This EUA will remain in effect (meaning this test can be used) for the duration of the COVID-19 declaration under Section 564(b)(1) of the Act, 21 U.S.C. section 360bbb-3(b)(1), unless the authorization is terminated or revoked.  Performed at [facility], [place] [address], [place], [place] [address]    *Note: Due to a large number of results and/or encounters for the requested time period, some results have not been displayed. A complete set of results can be found in Results Review.    Blood Alcohol level:  Lab  Results  Component Value Date   ETH <10  123456    Metabolic Disorder Labs: Lab Results  Component Value Date   HGBA1C 5.6 05/26/2022   MPG 117 10/22/2008   Lab Results  Component Value Date   PROLACTIN 15.4 08/12/2022   Lab Results  Component Value Date   CHOL 208 (H) 05/26/2022   TRIG 113.0 05/26/2022   HDL 74.00 05/26/2022   CHOLHDL 3 05/26/2022   VLDL 22.6 05/26/2022   LDLCALC 111 (H) 05/26/2022   LDLCALC 95 07/06/2021    Physical Findings: AIMS: Facial and Oral Movements Muscles of Facial Expression: None, normal Lips and Perioral Area: Minimal Jaw: None, normal Tongue: None, normal,Extremity Movements Upper (arms, wrists, hands, fingers): None, normal Lower (legs, knees, ankles, toes): None, normal, Trunk Movements Neck, shoulders, hips: None, normal, Overall Severity Severity of abnormal movements (highest score from questions above): Minimal Incapacitation due to abnormal movements: None, normal Patient's awareness of abnormal movements (rate only patient's report): No Awareness, Dental Status Current problems with teeth and/or dentures?: No Does patient usually wear dentures?: No  CIWA:    COWS:     Musculoskeletal: Strength & Muscle Tone: within normal limits Gait & Station: normal Patient leans: N/A  Psychiatric Specialty Exam:  Presentation  General Appearance:  Appropriate for Environment; Casual  Eye Contact: Good  Speech: Clear and Coherent; Normal Rate  Speech Volume: Normal  Handedness: Right   Mood and Affect  Mood: Anxious; Depressed  Affect: Congruent   Thought Process  Thought Processes: Coherent; Goal Directed; Linear  Descriptions of Associations:Intact  Orientation:Full (Time, Place and Person)  Thought Content:Logical; WDL  History of Schizophrenia/Schizoaffective disorder:No  Duration of Psychotic Symptoms:No data recorded Hallucinations:No data recorded Ideas of Reference:None  Suicidal Thoughts:No data recorded Homicidal Thoughts:No  data recorded  Sensorium  Memory: Immediate Good; Recent Fair  Judgment: Fair  Insight: Present   Executive Functions  Concentration: Good  Attention Span: Good  Recall: Good  Fund of Knowledge: Good  Language: Good   Psychomotor Activity  Psychomotor Activity:No data recorded  Assets  Assets: Communication Skills; Desire for Improvement; Housing; Leisure Time; Resilience; Social Support   Sleep  Sleep:No data recorded    Blood pressure (!) 112/56, pulse (!) 59, temperature 98.8 F (37.1 C), temperature source Oral, resp. rate 16, height '5\' 1"'$  (1.549 m), weight 68.3 kg, SpO2 100 %. Body mass index is 28.44 kg/m.   Treatment Plan Summary: Daily contact with patient to assess and evaluate symptoms and progress in treatment, Medication management, and Plan continue current medication.  Scheduled trazodone.  Parks Ranger, DO 08/14/2022, 1:13 PM

## 2022-08-15 ENCOUNTER — Encounter (HOSPITAL_COMMUNITY): Admission: RE | Payer: Self-pay | Source: Home / Self Care

## 2022-08-15 ENCOUNTER — Inpatient Hospital Stay (HOSPITAL_COMMUNITY): Admission: RE | Admit: 2022-08-15 | Payer: PPO | Source: Home / Self Care | Admitting: Obstetrics and Gynecology

## 2022-08-15 DIAGNOSIS — R45851 Suicidal ideations: Secondary | ICD-10-CM | POA: Diagnosis not present

## 2022-08-15 DIAGNOSIS — N8189 Other female genital prolapse: Secondary | ICD-10-CM

## 2022-08-15 SURGERY — HYSTERECTOMY, ABDOMINAL, WITH SALPINGO-OOPHORECTOMY
Anesthesia: General

## 2022-08-15 MED ORDER — BUDESONIDE 3 MG PO CPEP
3.0000 mg | ORAL_CAPSULE | Freq: Every day | ORAL | Status: DC
  Administered 2022-08-15 – 2022-08-18 (×4): 3 mg via ORAL
  Filled 2022-08-15 (×4): qty 1

## 2022-08-15 MED ORDER — DOXEPIN HCL 50 MG PO CAPS
50.0000 mg | ORAL_CAPSULE | Freq: Every day | ORAL | Status: DC
  Administered 2022-08-15: 50 mg via ORAL
  Filled 2022-08-15: qty 1

## 2022-08-15 NOTE — Group Note (Signed)
Date:  08/15/2022 Time:  1:57 AM  Group Topic/Focus:  Healthy Communication:   The focus of this group is to discuss communication, barriers to communication, as well as healthy ways to communicate with others.    Participation Level:  Active  Participation Quality:  Attentive  Affect:  Appropriate  Cognitive:  Alert  Insight: Appropriate  Engagement in Group:  Supportive  Modes of Intervention:  Support  Additional Comments:    Bradd Canary 08/15/2022, 1:57 AM

## 2022-08-15 NOTE — Group Note (Signed)
Date:  08/15/2022 Time:  11:04 AM  Group Topic/Focus:  Overcoming Stress:   The focus of this group is to define stress and help patients assess their triggers. Self Care:   The focus of this group is to help patients understand the importance of self-care in order to improve or restore emotional, physical, spiritual, interpersonal, and financial health.    Participation Level:  Active  Participation Quality:  Appropriate  Affect:  Appropriate  Cognitive:  Alert  Insight: Appropriate  Engagement in Group:  Engaged  Modes of Intervention:  Activity and Socialization  Additional Comments:  Bingo  Aqeel Norgaard l Essence Merle 08/15/2022, 11:04 AM

## 2022-08-15 NOTE — Plan of Care (Signed)
  Problem: Education: Goal: Knowledge of General Education information will improve Description: Including pain rating scale, medication(s)/side effects and non-pharmacologic comfort measures Outcome: Progressing   Problem: Activity: Goal: Risk for activity intolerance will decrease Outcome: Progressing   Problem: Nutrition: Goal: Adequate nutrition will be maintained Outcome: Progressing   Problem: Safety: Goal: Ability to remain free from injury will improve Outcome: Progressing   Problem: Skin Integrity: Goal: Risk for impaired skin integrity will decrease Outcome: Progressing   Problem: Coping: Goal: Level of anxiety will decrease Outcome: Not Progressing

## 2022-08-15 NOTE — Group Note (Signed)
Recreation Therapy Group Note   Group Topic:Communication  Group Date: 08/15/2022 Start Time: 1400 End Time: 1450 Facilitators: Vilma Prader, LRT, CTRS Location: Courtyard  Group Description: [employer]. LRT and NT brought pts outside to the courtyard to get fresh air and sunlight. During the time outside, we tossed around a beach ball that has many different prompts and questions on it while listening to music. After playing, pts had the option to go back inside, to walk around the courtyard, or sit and listen to music.   Affect/Mood: Flat   Participation Level: Active and Engaged   Participation Quality: Independent   Behavior: Appropriate   Speech/Thought Process: Coherent   Insight: Good   Judgement: Good   Modes of Intervention: Activity   Patient Response to Interventions:  Engaged, Interested , and Receptive   Education Outcome:  Acknowledges education   Clinical Observations/Individualized Feedback: Nayleen was active in their participation of session activities and group discussion. Pt identified "not getting better" was her biggest fear, which was a prompt off the beach ball. Pt was walking around the courtyard afterwards getting exercise. Pt was pleasant but flat throughout group.    Plan: Continue to engage patient in RT group sessions 2-3x/week.   [address], LRT, CTRS 08/15/2022 3:11 PM

## 2022-08-15 NOTE — [facility] Suicide Risk Assessment (Signed)
[place] INPATIENT:  Family/Significant Other Suicide Prevention Education  Suicide Prevention Education:  Education Completed; vilena, cajas, [phone] has been identified by the patient as the family member/significant other with whom the patient will be residing, and identified as the person(s) who will aid the patient in the event of a mental health crisis (suicidal ideations/suicide attempt).  With written consent from the patient, the family member/significant other has been provided the following suicide prevention education, prior to the and/or following the discharge of the patient.  The suicide prevention education provided includes the following: Suicide risk factors Suicide prevention and interventions National Suicide Hotline telephone number [facility] assessment telephone number [facility] Emergency Assistance [place] and/or Residential Mobile Crisis Unit telephone number  Request made of family/significant other to: Remove weapons (e.g., guns, rifles, knives), all items previously/currently identified as safety concern.   Remove drugs/medications (over-the-counter, prescriptions, illicit drugs), all items previously/currently identified as a safety concern.  The family member/significant other verbalizes understanding of the suicide prevention education information provided.  The family member/significant other agrees to remove the items of safety concern listed above.  Abigayl Hor A [place] 08/15/2022, 2:37 PM

## 2022-08-15 NOTE — Progress Notes (Signed)
[facility] MD Progress Note  08/15/2022 11:17 AM Victoria [name]  MRN:  [id number] Subjective: Victoria [name] is seen on rounds.  She tells me even with Remeron and trazodone she did not sleep very well last night.  She feels like the pindolol has been helpful for her anxiety and restlessness.  I discussed with her doxepin for depression and sleep.  She was willing to give it a try. Principal Problem: Major depressive disorder, recurrent episode, severe (HCC) Diagnosis: Principal Problem:   Major depressive disorder, recurrent episode, severe (HCC) Active Problems:   Passive suicidal ideations  Total Time spent with patient: 15 minutes  Past Psychiatric History: Long history of depression and has been treated at [facility] outpatient behavioral health.  Past Medical History:  Past Medical History:  Diagnosis Date   Allergic bronchopulmonary aspergillosis ([place]) 2008   sees Dr Edmund Hilda pulmonology   Anemia    iron deficiency, resolved   Anxiety    Asthma    CAD (coronary artery disease)    a. LHC 6/16:  oOM1 60, pRCA 25 >> med Rx b. cath 3/19 2nd OM with 95% stenosis s/p synergy DES & anomalous RCA   CAP (community acquired pneumonia) 2016; 06/07/2016   Chronic bronchitis (HCC)    Chronic lower back pain    Complication of anesthesia    "think I have a hard time waking up from it"   COPD (chronic obstructive pulmonary disease) ([place])    Depression    mild   Diverticulitis    Diverticulosis    GERD (gastroesophageal reflux disease)    H/O hiatal hernia    Headache    "weekly" (08/23/2017)   History of echocardiogram    Echo 6/16:  Mod LVH, EF 60-65%, no RWMA, Gr 1 DD, trivial MR, normal LA size.   Hyperglycemia 11/20/2015   Hyperlipidemia, mixed 09/11/2007   Qualifier: Diagnosis of  By: Jerold Coombe   Did not tolerate Lipitor, zocor, Lovastatin, Pravastatin, Livalo, Crestor even low dose    IBS (irritable bowel syndrome)    Maxillary sinusitis    Normal cardiac stress test 11/2011   No  evidence of ischemia or infarct.   Calculated ejection fraction 72%.   Obesity    OSA (obstructive sleep apnea) 02/2012   has stopped using  cpap   Osteoarthritis    Osteoporosis    Pneumonia 11/2011   "before 2013 I hadn't had pneumonia since I was a child" (04/13/2012)   Pulmonary nodules    S/P angioplasty with stent 08/23/17 ostial 2nd OM with DES synnergy 08/24/2017   Schatzki's ring     Past Surgical History:  Procedure Laterality Date   APPENDECTOMY  1989   CARDIAC CATHETERIZATION N/A 11/25/2014   Procedure: Right/Left Heart Cath and Coronary Angiography;  Surgeon: Belva Crome, MD;  Location: [place] [facility];  Service: Cardiovascular;  Laterality: N/A;   [place]   COLONOSCOPY  03/2022   CORONARY ANGIOPLASTY WITH STENT PLACEMENT  08/23/2017   CORONARY STENT INTERVENTION N/A 08/23/2017   Procedure: CORONARY STENT INTERVENTION;  Surgeon: Burnell Blanks, MD;  Location: [place] [facility];  Service: Cardiovascular;  Laterality: N/A;   HERNIA REPAIR  04/13/2012   VHR laparoscopic   LEFT HEART CATH AND CORONARY ANGIOGRAPHY N/A 08/23/2017   Procedure: LEFT HEART CATH AND CORONARY ANGIOGRAPHY;  Surgeon: Burnell Blanks, MD;  Location: [place] [facility];  Service: Cardiovascular;  Laterality: N/A;   VENTRAL HERNIA REPAIR  04/13/2012  Procedure: LAPAROSCOPIC VENTRAL HERNIA;  Surgeon: Adin Hector, MD;  Location: [facility];  Service: General;  Laterality: N/A;  laparoscopic repair of incarcerated hernia   Family History:  Family History  Problem Relation Age of Onset   Breast cancer Mother    Hypertension Mother    Diabetes Mother    Cancer Mother        recurrent, metastatic breast cancer.    Diverticulosis Father    Prostate cancer Father    Breast cancer Sister    Cancer Sister        breast cancer, invasive ductal carcinoma in 2022,DCIS at 78 with 4 weeks of radiation, 5 years of Tamoxifen    Pulmonary embolism Brother        recurrent    Heart attack Maternal Grandfather    Cerebral palsy Son    Cancer Niece 59   Osteoporosis Niece    Stroke Neg Hx    Colon cancer Neg Hx    Esophageal cancer Neg Hx    Stomach cancer Neg Hx    Rectal cancer Neg Hx    Family Psychiatric  History: Unremarkable Social History:  Social History   Substance and Sexual Activity  Alcohol Use Yes   Comment: social use- last drink 3 weeks ago     Social History   Substance and Sexual Activity  Drug Use No    Social History   Socioeconomic History   Marital status: Married    Spouse name: Not on file   Number of children: 1   Years of education: 14   Highest education level: Associate degree: academic program  Occupational History   Occupation: Disabled   Tobacco Use   Smoking status: Never    Passive exposure: Never   Smokeless tobacco: Never  Vaping Use   Vaping Use: Never used  Substance and Sexual Activity   Alcohol use: Yes    Comment: social use- last drink 3 weeks ago   Drug use: No   Sexual activity: Yes    Comment: gluten free, lives with husband and son with CP quadriplegia  Other Topics Concern   Not on file  Social History Narrative   Cares for son with cerebral palsy.    Lives at home with her husband and son.   Right-handed.   2 cups caffeine per day.   One story home   Social Determinants of Health   Financial Resource Strain: Low Risk  (10/19/2021)   Overall Financial Resource Strain (CARDIA)    Difficulty of Paying Living Expenses: Not hard at all  Food Insecurity: No Food Insecurity (08/12/2022)   Hunger Vital Sign    Worried About Running Out of Food in the Last Year: Never true    Ran Out of Food in the Last Year: Never true  Transportation Needs: No Transportation Needs (08/12/2022)   PRAPARE - Hydrologist (Medical): No    Lack of Transportation (Non-Medical): No  Physical Activity: Insufficiently Active (10/19/2021)   Exercise Vital Sign    Days of Exercise per  Week: 7 days    Minutes of Exercise per Session: 20 min  Stress: Stress Concern Present (10/19/2021)   [place]    Feeling of Stress : Rather much  Social Connections: Moderately Integrated (10/19/2021)   Social Connection and Isolation Panel [NHANES]    Frequency of Communication with Friends and Family: More than three times a week  Frequency of Social Gatherings with Friends and Family: Once a week    Attends Religious Services: More than 4 times per year    Active Member of [employer] or Organizations: No    Attends Music therapist: Never    Marital Status: Married   Additional Social History:                         Sleep: Poor  Appetite:  Fair  Current Medications: Current Facility-Administered Medications  Medication Dose Route Frequency Provider Last Rate Last Admin   acetaminophen (TYLENOL) tablet 650 mg  650 mg Oral Q6H PRN Lucky Rathke, FNP   650 mg at 08/14/22 1817   albuterol (VENTOLIN HFA) 108 (90 Base) MCG/ACT inhaler 1-2 puff  1-2 puff Inhalation Q6H PRN Parks Ranger, DO   2 puff at 08/13/22 2256   alum & mag hydroxide-simeth (MAALOX/MYLANTA) 200-200-20 MG/5ML suspension 30 mL  30 mL Oral Q4H PRN Lucky Rathke, FNP       clonazePAM Bobbye [place]) tablet 0.5 mg  0.5 mg Oral BID PRN Lucky Rathke, FNP   0.5 mg at 08/15/22 0824   famotidine (PEPCID) tablet 20 mg  20 mg Oral Daily Lucky Rathke, FNP   20 mg at 08/15/22 1032   ipratropium-albuterol (DUONEB) 0.5-2.5 (3) MG/3ML nebulizer solution 3 mL  3 mL Nebulization Q6H PRN Lucky Rathke, FNP       magnesium hydroxide (MILK OF MAGNESIA) suspension 30 mL  30 mL Oral Daily PRN Lucky Rathke, FNP       mirtazapine (REMERON) tablet 15 mg  15 mg Oral QHS Parks Ranger, DO   15 mg at 08/14/22 2142   nitroGLYCERIN (NITROSTAT) SL tablet 0.4 mg  0.4 mg Sublingual Q5 min PRN Lucky Rathke, FNP       pindolol (VISKEN) tablet 5 mg   5 mg Oral QPC breakfast Parks Ranger, DO   5 mg at 08/15/22 1032   traZODone (DESYREL) tablet 100 mg  100 mg Oral QHS Parks Ranger, DO   100 mg at 08/14/22 [id number]   trolamine salicylate (ASPERCREME) 10 % cream   Topical BID PRN Parks Ranger, DO       venlafaxine XR (EFFEXOR-XR) 24 hr capsule 225 mg  225 mg Oral Q breakfast Lucky Rathke, FNP   225 mg at 08/15/22 [id number]    Lab Results: No results found. However, due to the size of the patient record, not all encounters were searched. Please check Results Review for a complete set of results.  Blood Alcohol level:  Lab Results  Component Value Date   ETH <10 123456    Metabolic Disorder Labs: Lab Results  Component Value Date   HGBA1C 5.6 05/26/2022   MPG 117 10/22/2008   Lab Results  Component Value Date   PROLACTIN 15.4 08/12/2022   Lab Results  Component Value Date   CHOL 208 (H) 05/26/2022   TRIG 113.0 05/26/2022   HDL 74.00 05/26/2022   CHOLHDL 3 05/26/2022   VLDL 22.6 05/26/2022   LDLCALC 111 (H) 05/26/2022   LDLCALC 95 07/06/2021    Physical Findings: AIMS: Facial and Oral Movements Muscles of Facial Expression: None, normal Lips and Perioral Area: Minimal Jaw: None, normal Tongue: None, normal,Extremity Movements Upper (arms, wrists, hands, fingers): None, normal Lower (legs, knees, ankles, toes): None, normal, Trunk Movements Neck, shoulders, hips: None, normal, Overall Severity Severity of abnormal movements (highest  score from questions above): Minimal Incapacitation due to abnormal movements: None, normal Patient's awareness of abnormal movements (rate only patient's report): No Awareness, Dental Status Current problems with teeth and/or dentures?: No Does patient usually wear dentures?: No  CIWA:    COWS:     Musculoskeletal: Strength & Muscle Tone: within normal limits Gait & Station: normal Patient leans: N/A  Psychiatric Specialty Exam:  Presentation  General  Appearance:  Appropriate for Environment; Casual  Eye Contact: Good  Speech: Clear and Coherent; Normal Rate  Speech Volume: Normal  Handedness: Right   Mood and Affect  Mood: Anxious; Depressed  Affect: Congruent   Thought Process  Thought Processes: Coherent; Goal Directed; Linear  Descriptions of Associations:Intact  Orientation:Full (Time, Place and Person)  Thought Content:Logical; WDL  History of Schizophrenia/Schizoaffective disorder:No  Duration of Psychotic Symptoms:No data recorded Hallucinations:No data recorded Ideas of Reference:None  Suicidal Thoughts:No data recorded Homicidal Thoughts:No data recorded  Sensorium  Memory: Immediate Good; Recent Fair  Judgment: Fair  Insight: Present   Executive Functions  Concentration: Good  Attention Span: Good  Recall: Good  Fund of Knowledge: Good  Language: Good   Psychomotor Activity  Psychomotor Activity:No data recorded  Assets  Assets: Communication Skills; Desire for Improvement; Housing; Leisure Time; Resilience; Social Support   Sleep  Sleep:No data recorded   Physical Exam: Physical Exam Vitals and nursing note reviewed.  Constitutional:      Appearance: Normal appearance. She is normal weight.  Neurological:     General: No focal deficit present.     Mental Status: She is alert and oriented to person, place, and time.  Psychiatric:        Attention and Perception: Attention and perception normal.        Mood and Affect: Mood is depressed. Affect is flat.        Speech: Speech normal.        Behavior: Behavior normal. Behavior is cooperative.        Thought Content: Thought content normal.        Cognition and Memory: Cognition and memory normal.        Judgment: Judgment normal.    Review of Systems  Constitutional: Negative.   HENT: Negative.    Eyes: Negative.   Respiratory: Negative.    Cardiovascular: Negative.   Gastrointestinal: Negative.    Genitourinary: Negative.   Musculoskeletal: Negative.   Skin: Negative.   Neurological: Negative.   Endo/Heme/Allergies: Negative.   Psychiatric/Behavioral:  Positive for depression. The patient is nervous/anxious and has insomnia.    Blood pressure (!) 108/56, pulse 75, temperature 98.5 F (36.9 C), temperature source Oral, resp. rate 14, height '5\' 1"'$  (1.549 m), weight 68.3 kg, SpO2 99 %. Body mass index is 28.44 kg/m.   Treatment Plan Summary: Daily contact with patient to assess and evaluate symptoms and progress in treatment, Medication management, and Plan discontinue Remeron and trazodone.  Start doxepin 50 mg at bedtime.  Consider Seroquel future.  Parks Ranger, DO 08/15/2022, 11:17 AM

## 2022-08-15 NOTE — BH Assessment (Signed)
1910 Received patient in the day room alert and watching TV. She later came over to make a call in which she was on the phone for approx 10 minutes. Will continue to monitor patient for safety.  2115 Patient is alert and oriented x 4, she is currently denying SI/HI, A/V hallucinations, and depression. She rate anxiety at 5/10 but refuses an anti-anxiety. Does not have the overwhelming feelings regarding her son care as the evening before.  2200 Patient is medication compliant and received Remeron 15 mg and Trazodone 100 mg for sleep as scheduled. This is the first night she has had the schedule combination. Will continue to monitor patient for safety.  0200 Patient has been asleep for three hours on the new combination of sleep medication. Will continue to monitor patient for safety.  0630 Patient is awake lying in bed with eyes opened. Will continue to monitor

## 2022-08-15 NOTE — Progress Notes (Signed)
PT Cancellation Note  Patient Details Name: CAPRECIA DEWBERRY MRN: [id number] DOB: 09-Oct-1955   Cancelled Treatment:    Reason Eval/Treat Not Completed: Other (comment): Per nursing pt just received dinner tray.  Will attempt to see pt at a future date/time as medically appropriate.     Linus Salmons PT, DPT 08/15/22, 4:30 PM

## 2022-08-15 NOTE — Progress Notes (Signed)
Patient pleasant upon approach.  Patient reports anxiety and requested PRN medication this morning. Patient states today she is overwhelmed with feelings of depression.  When questioned further, the patient reported she is worried about how she will care for her 66 year old son with cerebral palsy.  "I never expected him to live this long."  Patient expressed feeling guilty over leaving [place].  Patient stated if they still lived in [place] her son would have housing options.  Denies SI/HI and AVH.  Denies pain. 15 min checks in place for safety. Compliant with scheduled medications.  Present in the milieu.  Appropriate interaction with peers.  Participated in MHT and recreational therapy groups.  Tylenol given for back pain 5/10.

## 2022-08-15 NOTE — BH IP Treatment Plan (Signed)
Interdisciplinary Treatment and Diagnostic Plan Update  08/15/2022 Time of Session: 9:30AM Victoria [name] MRN: [id number]  Principal Diagnosis: Major depressive disorder, recurrent episode, severe ([place])  Secondary Diagnoses: Principal Problem:   Major depressive disorder, recurrent episode, severe ([place]) Active Problems:   Passive suicidal ideations   Current Medications:  Current Facility-Administered Medications  Medication Dose Route Frequency Provider Last Rate Last Admin   acetaminophen (TYLENOL) tablet 650 mg  650 mg Oral Q6H PRN Lucky Rathke, FNP   650 mg at 08/14/22 1817   albuterol (VENTOLIN HFA) 108 (90 Base) MCG/ACT inhaler 1-2 puff  1-2 puff Inhalation Q6H PRN Parks Ranger, DO   2 puff at 08/13/22 2256   alum & mag hydroxide-simeth (MAALOX/MYLANTA) 200-200-20 MG/5ML suspension 30 mL  30 mL Oral Q4H PRN Lucky Rathke, FNP       clonazePAM Bobbye [place]) tablet 0.5 mg  0.5 mg Oral BID PRN Lucky Rathke, FNP   0.5 mg at 08/15/22 0824   famotidine (PEPCID) tablet 20 mg  20 mg Oral Daily Lucky Rathke, FNP   20 mg at 08/14/22 0836   ipratropium-albuterol (DUONEB) 0.5-2.5 (3) MG/3ML nebulizer solution 3 mL  3 mL Nebulization Q6H PRN Lucky Rathke, FNP       magnesium hydroxide (MILK OF MAGNESIA) suspension 30 mL  30 mL Oral Daily PRN Lucky Rathke, FNP       mirtazapine (REMERON) tablet 15 mg  15 mg Oral QHS Parks Ranger, DO   15 mg at 08/14/22 2142   nitroGLYCERIN (NITROSTAT) SL tablet 0.4 mg  0.4 mg Sublingual Q5 min PRN Lucky Rathke, FNP       pindolol (VISKEN) tablet 5 mg  5 mg Oral QPC breakfast Parks Ranger, DO   5 mg at 08/14/22 [id number]   traZODone (DESYREL) tablet 100 mg  100 mg Oral QHS Parks Ranger, DO   100 mg at 08/14/22 [id number]   trolamine salicylate (ASPERCREME) 10 % cream   Topical BID PRN Parks Ranger, DO       venlafaxine XR (EFFEXOR-XR) 24 hr capsule 225 mg  225 mg Oral Q breakfast Lucky Rathke, FNP   225 mg at 08/15/22  [id number]   PTA Medications: Medications Prior to Admission  Medication Sig Dispense Refill Last Dose   albuterol (VENTOLIN HFA) 108 (90 Base) MCG/ACT inhaler Inhale 1-2 puffs into the lungs every 6 (six) hours as needed. 8 g 2    aspirin EC 81 MG tablet Take 81 mg by mouth daily. Swallow whole.      Benralizumab (FASENRA PEN) 30 MG/ML SOAJ Inject 1 mL (30 mg total) into the skin every 8 (eight) weeks. 1 mL 2    Bioflavonoid Products (ESTER C PO) Take 500 mg by mouth at bedtime.      budesonide (ENTOCORT EC) 3 MG 24 hr capsule Take 3 caps a day for 4 weeks then take 2 caps daily for 4 weeks then take 1 capsule daily for 4 weeks. (Patient taking differently: Take 3-9 mg by mouth See admin instructions. Take 3 caps a day for 4 weeks then take 2 caps daily for 4 weeks then take 1 capsule daily for 4 weeks starting on 06/27/22.) 222 capsule 0    Calcium-Magnesium-Vitamin D (CALCIUM MAGNESIUM PO) Take 1 tablet by mouth at bedtime.      clonazePAM (KLONOPIN) 0.5 MG tablet Take 0.5 mg by mouth daily as needed for anxiety.      Evolocumab (  REPATHA SURECLICK) [id number] MG/ML SOAJ INJECT 1 PEN INTO THE SKIN EVERY 14 DAYS (Patient taking differently: Inject 140 mg into the skin every 21 ( twenty-one) days.) 6 mL 3    famotidine (PEPCID) 20 MG tablet TAKE 1 TABLET BY MOUTH TWICE A DAY (Patient taking differently: Take 20 mg by mouth daily.) 60 tablet 2    fexofenadine (ALLEGRA) 180 MG tablet Take 180 mg by mouth daily as needed for allergies or rhinitis.      fluticasone (FLONASE) 50 MCG/ACT nasal spray Place 1 spray into both nostrils daily as needed for allergies or rhinitis.      ipratropium-albuterol (DUONEB) 0.5-2.5 (3) MG/3ML SOLN Take 3 mLs by nebulization every 6 (six) hours as needed. (Patient taking differently: Take 3 mLs by nebulization every 6 (six) hours as needed (For shortness of breath or wheezing).) 120 mL 5    LORazepam (ATIVAN) 1 MG tablet Take 1 tablet (1 mg total) by mouth at bedtime as needed for  anxiety or sleep. (Patient taking differently: Take 1 mg by mouth at bedtime.) 30 tablet 0    nitroGLYCERIN (NITROSTAT) 0.4 MG SL tablet PLACE 1 TABLET UNDER THE TONGUE EVERY 5 MINUTES AS NEEDED FOR CHEST PAIN. (Patient taking differently: 0.4 mg every 5 (five) minutes as needed for chest pain.) 25 tablet 8    traZODone (DESYREL) 50 MG tablet Take 50-100 tablets by mouth at bedtime.      venlafaxine XR (EFFEXOR XR) 75 MG 24 hr capsule Take 3 capsules (225 mg total) by mouth daily with breakfast. 90 capsule 1    VITAMIN D PO Take 5,000 Units by mouth at bedtime.       Patient Stressors: Medication change or noncompliance   Other: caring for adult son with cerebral palsy    Patient Strengths: Ability for insight  Average or above average intelligence  Capable of independent living  Motivation for treatment/growth  Religious Affiliation  Supportive family/friends   Treatment Modalities: Medication Management, Group therapy, Case management,  1 to 1 session with clinician, Psychoeducation, Recreational therapy.   Physician Treatment Plan for Primary Diagnosis: Major depressive disorder, recurrent episode, severe ([place]) Long Term Goal(s): Improvement in symptoms so as ready for discharge   Short Term Goals: Ability to identify changes in lifestyle to reduce recurrence of condition will improve Ability to verbalize feelings will improve Ability to disclose and discuss suicidal ideas Ability to demonstrate self-control will improve Ability to identify and develop effective coping behaviors will improve Ability to maintain clinical measurements within normal limits will improve Compliance with prescribed medications will improve Ability to identify triggers associated with substance abuse/mental health issues will improve  Medication Management: Evaluate patient's response, side effects, and tolerance of medication regimen.  Therapeutic Interventions: 1 to 1 sessions, Unit Group sessions  and Medication administration.  Evaluation of Outcomes: Not Met  Physician Treatment Plan for Secondary Diagnosis: Principal Problem:   Major depressive disorder, recurrent episode, severe ([place]) Active Problems:   Passive suicidal ideations  Long Term Goal(s): Improvement in symptoms so as ready for discharge   Short Term Goals: Ability to identify changes in lifestyle to reduce recurrence of condition will improve Ability to verbalize feelings will improve Ability to disclose and discuss suicidal ideas Ability to demonstrate self-control will improve Ability to identify and develop effective coping behaviors will improve Ability to maintain clinical measurements within normal limits will improve Compliance with prescribed medications will improve Ability to identify triggers associated with substance abuse/mental health issues will improve  Medication Management: Evaluate patient's response, side effects, and tolerance of medication regimen.  Therapeutic Interventions: 1 to 1 sessions, Unit Group sessions and Medication administration.  Evaluation of Outcomes: Not Met   RN Treatment Plan for Primary Diagnosis: Major depressive disorder, recurrent episode, severe ([place]) Long Term Goal(s): Knowledge of disease and therapeutic regimen to maintain health will improve  Short Term Goals: Ability to remain free from injury will improve, Ability to demonstrate self-control, Ability to participate in decision making will improve, Ability to verbalize feelings will improve, Ability to identify and develop effective coping behaviors will improve, and Compliance with prescribed medications will improve  Medication Management: RN will administer medications as ordered by provider, will assess and evaluate patient's response and provide education to patient for prescribed medication. RN will report any adverse and/or side effects to prescribing provider.  Therapeutic Interventions: 1 on 1  counseling sessions, Psychoeducation, Medication administration, Evaluate responses to treatment, Monitor vital signs and CBGs as ordered, Perform/monitor CIWA, COWS, AIMS and Fall Risk screenings as ordered, Perform wound care treatments as ordered.  Evaluation of Outcomes: Not Met   LCSW Treatment Plan for Primary Diagnosis: Major depressive disorder, recurrent episode, severe ([place]) Long Term Goal(s): Safe transition to appropriate next level of care at discharge, Engage patient in therapeutic group addressing interpersonal concerns.  Short Term Goals: Engage patient in aftercare planning with referrals and resources, Increase social support, Increase ability to appropriately verbalize feelings, Increase emotional regulation, Facilitate acceptance of mental health diagnosis and concerns, and Increase skills for wellness and recovery  Therapeutic Interventions: Assess for all discharge needs, 1 to 1 time with Social worker, Explore available resources and support systems, Assess for adequacy in community support network, Educate family and significant other(s) on suicide prevention, Complete Psychosocial Assessment, Interpersonal group therapy.  Evaluation of Outcomes: Not Met   Progress in Treatment: Attending groups: Yes. Participating in groups: Yes. Taking medication as prescribed: Yes. Toleration medication: Yes. Family/Significant other contact made: No, will contact:  when given permission Patient understands diagnosis: Yes. Discussing patient identified problems/goals with staff: Yes. Medical problems stabilized or resolved: Yes. Denies suicidal/homicidal ideation: Yes. Issues/concerns per patient self-inventory: No. Other: None  New problem(s) identified: No, Describe:  None  New Short Term/Long Term Goal(s): Patient to work towards medication management for mood stabilization; development of comprehensive mental wellness plan.   Patient Goals: "feel less depressed and go  home"  Discharge Plan or Barriers:  CSW will assist pt with development of appropriate discharge/aftercare plan   Reason for Continuation of Hospitalization: Anxiety Depression Medication stabilization  Estimated Length of Stay: 1-7 days  Last 3 [place] Suicide Severity Risk Score: Flowsheet Row Admission (Current) from 08/12/2022 in [place] Most recent reading at 08/12/2022  9:31 PM ED from 08/12/2022 in [facility] Most recent reading at 08/12/2022  3:47 PM ED from 08/02/2022 in [facility] Most recent reading at 08/02/2022 12:53 PM  C-SSRS RISK CATEGORY Low Risk No Risk Low Risk       Last PHQ 2/9 Scores:    06/30/2022    3:48 PM 05/26/2022    9:40 AM 11/25/2021    1:56 PM  Depression screen PHQ 2/9  Decreased Interest 0 2 0  Down, Depressed, Hopeless 3 1 0  PHQ - 2 Score 3 3 0  Altered sleeping 0 0 2  Tired, decreased energy 0 0 1  Change in appetite 0 0 0  Feeling bad or failure about yourself  0 0 0  Trouble concentrating 1 0 1  Moving slowly or fidgety/restless 0 0 0  Suicidal thoughts 0 1 0  PHQ-9 Score '4 4 4  '$ Difficult doing work/chores  Not difficult at all Somewhat difficult    Scribe for Treatment Team: Krithik Mapel A [place], [place] 08/15/2022 10:15 AM

## 2022-08-15 NOTE — Progress Notes (Signed)
   08/15/22 2015  Psych Admission Type (Psych Patients Only)  Admission Status Voluntary  Psychosocial Assessment  Patient Complaints Insomnia  Eye Contact Fair  Facial Expression Anxious  Affect Anxious  Speech Logical/coherent  Interaction Assertive  Motor Activity Slow  Appearance/Hygiene Unremarkable  Behavior Characteristics Cooperative;Appropriate to situation  Mood Pleasant;Anxious  Thought Process  Coherency WDL  Content WDL  Delusions None reported or observed  Perception WDL  Hallucination None reported or observed  Judgment Impaired  Confusion None  Danger to Self  Current suicidal ideation? Denies  Agreement Not to Harm Self Yes  Description of Agreement verbal  Danger to Others  Danger to Others None reported or observed   Progress note   D: Pt seen in her room. Pt denies SI, HI, AVH. Pt rates pain  0/10. Does say that because of her scoliosis that the bed isn't conducive to a good night's sleep. "I can lay on my back after I take Tylenol to feel better, but I am a side sleeper." Pt offered and given an extra pillow to place between her legs to offset the pressure when she sleeps on her side. Pt rates anxiety  0/10 and depression  0/10. Pt stated that she discussed starting Doxepin for her insomnia. "It took me 2 hours to fall asleep last night after taking the trazodone. The Remeron does nothing for me at all. No other concerns noted at this time.  A: Pt provided support and encouragement. Pt given scheduled medication as prescribed. PRNs as appropriate. Q15 min checks for safety.   R: Pt safe on the unit. Will continue to monitor.

## 2022-08-15 NOTE — BH Assessment (Signed)
Recreation Therapy Notes  INPATIENT RECREATION THERAPY ASSESSMENT  Patient Details Name: Victoria [name] MRN: [id number] DOB: May 09, 1956 Today's Date: 08/15/2022       Information Obtained From: Patient (In addition to chart review)  Able to Participate in Assessment/Interview: Yes  Patient Presentation: Responsive, Anxious (Pt was noticably rocking back and forth.)  Reason for Admission (Per Patient): Active Symptoms, Suicidal Ideation ("Around thanksgiving it started to get worse. I was just so anxious and depressed I did not want to be here anymore. I would wake up and my stomach would be upset, I would rock and shuffle my hands".)  Patient Stressors: Family ("I worry about my son and where he is going to live. There is a housing crisis for disabled people.")  Coping Skills:   Other (Comment) ("Self care")  Leisure Interests (2+):  Exercise - Walking, Individual - Tablet, Music - Listen, Community - Other (Comment) [employer], daily devotinals, games on my kindle, sitting outside in the sun, stretching.")  Frequency of Recreation/Participation:  ("At least 5 out of 7 days".)  Awareness of Community Resources:  Yes  Community Resources:  [place], [place]  Current Use: Yes  If no, Barriers?:    Expressed Interest in [employer] Information:  No  County of Residence:  [place]  Patient Main Form of Transportation: Car  Patient Strengths:  "I am good at keeping in [place] with people. I like talking to my friends and my younger sister."  Patient Identified Areas of Improvement:  "I dont want to be anxious and depressed. I hope to find housing for my son."  Patient Goal for Hospitalization:  "Take better care of myself while I am here but that is difficult since I dont have the tools for that" (Pt was preering to scoilosis "tools". For example, heating pad, foam mattress pad, and massage gun.)  Current SI (including self-harm):  No  Current HI:   No  Current AVH: No  Staff Intervention Plan: Group Attendance, Collaborate with Interdisciplinary Treatment Team  Consent to Intern Participation: N/A  Juline Patch LRT, CTRS  Seini Lannom E Francene Mcerlean 08/15/2022, 1:23 PM

## 2022-08-15 NOTE — Plan of Care (Signed)
  Problem: Coping Skills Goal: STG - Patient will identify 3 positive coping skills strategies to use for anxiety post d/c within 5 recreation therapy group sessions Description: STG - Patient will identify 3 positive coping skills strategies to use for anxiety post d/c within 5 recreation therapy group sessions Outcome: Not Applicable    Juline Patch LRT, CTRS

## 2022-08-16 ENCOUNTER — Ambulatory Visit (HOSPITAL_COMMUNITY): Payer: PPO

## 2022-08-16 DIAGNOSIS — R45851 Suicidal ideations: Secondary | ICD-10-CM | POA: Diagnosis not present

## 2022-08-16 MED ORDER — DOXEPIN HCL 25 MG PO CAPS
25.0000 mg | ORAL_CAPSULE | Freq: Every day | ORAL | Status: DC
  Administered 2022-08-16 – 2022-08-17 (×2): 25 mg via ORAL
  Filled 2022-08-16 (×2): qty 1

## 2022-08-16 MED ORDER — RISPERIDONE 1 MG PO TABS
0.5000 mg | ORAL_TABLET | ORAL | Status: DC
  Administered 2022-08-16 – 2022-08-18 (×4): 0.5 mg via ORAL
  Filled 2022-08-16 (×4): qty 1

## 2022-08-16 NOTE — Evaluation (Signed)
Physical Therapy Evaluation Patient Details Name: Victoria [name] MRN: [id number] DOB: 02-17-1956 Today's Date: 08/16/2022  History of Present Illness  Pt is a 67 year old female who was voluntarily admitted to inpatient psychiatry for worsening depression and suicidal ideation. PMH includes scoliosis, low back pain, and COPD.   Clinical Impression  Pt pleasant and reported no back pain at time of PT evaluation.  Pt independent with all functional tasks and demonstrated very good control and stability with transfers and gait without an AD.  Pt education provided on strategies for positioning with pillow support for side-sleeping to assist with back discomfort she has been experiencing with hospital bed.  Pt reported improved comfort with proper set-up and requested heating pad for her back which she uses at home, nursing notified.  Pt reported no back pain or further needs from PT services, will complete PT orders at this time but will reassess pt pending a change in status upon receipt of new PT orders.       Recommendations for follow up therapy are one component of a multi-disciplinary discharge planning process, led by the attending physician.  Recommendations may be updated based on patient status, additional functional criteria and insurance authorization.  Follow Up Recommendations No PT follow up      Assistance Recommended at Discharge None  Patient can return home with the following  Assist for transportation    Equipment Recommendations None recommended by PT  Recommendations for Other Services       Functional Status Assessment Patient has not had a recent decline in their functional status     Precautions / Restrictions Precautions Precautions: None      Mobility  Bed Mobility Overal bed mobility: Independent                  Transfers Overall transfer level: Independent                 General transfer comment: Very good control and stability with  transfers    Ambulation/Gait Ambulation/Gait assistance: Independent Gait Distance (Feet): 150 Feet Assistive device: None Gait Pattern/deviations: WFL(Within Functional Limits) Gait velocity: WNL     General Gait Details: Pt steady with gait without an AD including during start/stops and 180 deg turns  Financial trader Rankin (Stroke Patients Only)       Balance Overall balance assessment: No apparent balance deficits (not formally assessed)                                           Pertinent Vitals/Pain Pain Assessment Pain Assessment: No/denies pain    Home Living Family/patient expects to be discharged to:: Private residence Living Arrangements: Spouse/significant other;Children Available Help at Discharge: Family;Available 24 hours/day Type of Home: House Home Access: Ramped entrance       Home Layout: One level Home Equipment: Grab bars - tub/shower Additional Comments: 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    Prior Function Prior Level of Function : Independent/Modified Independent             Mobility Comments: Ind amb community distances without an AD, no fall history ADLs Comments: Ind with ADLs     Hand Dominance        Extremity/Trunk Assessment   Upper Extremity Assessment Upper Extremity  Assessment: Overall WFL for tasks assessed    Lower Extremity Assessment Lower Extremity Assessment: Overall WFL for tasks assessed       Communication   Communication: No difficulties  Cognition Arousal/Alertness: Awake/alert Behavior During Therapy: WFL for tasks assessed/performed Overall Cognitive Status: Within Functional Limits for tasks assessed                                          General Comments      Exercises     Assessment/Plan    PT Assessment Patient does not need any further PT services  PT Problem List         PT Treatment  Interventions      PT Goals (Current goals can be found in the Care Plan section)  Acute Rehab PT Goals PT Goal Formulation: All assessment and education complete, DC therapy    Frequency       Co-evaluation               AM-PAC PT "6 Clicks" Mobility  Outcome Measure Help needed turning from your back to your side while in a flat bed without using bedrails?: None Help needed moving from lying on your back to sitting on the side of a flat bed without using bedrails?: None Help needed moving to and from a bed to a chair (including a wheelchair)?: None Help needed standing up from a chair using your arms (e.g., wheelchair or bedside chair)?: None Help needed to walk in hospital room?: None Help needed climbing 3-5 steps with a railing? : None 6 Click Score: 24    End of Session   Activity Tolerance: Patient tolerated treatment well Patient left: in bed   PT Visit Diagnosis: Pain Pain - part of body:  (back)    Time: [id number] PT Time Calculation (min) (ACUTE ONLY): 16 min   Charges:   PT Evaluation $PT Eval Low Complexity: 1 Low        D. Royetta Asal PT, DPT 08/16/22, 12:18 PM

## 2022-08-16 NOTE — Group Note (Signed)
Recreation Therapy Group Note   Group Topic:Healthy Support Systems  Group Date: 08/16/2022 Start Time: 1400 End Time: 1455 Facilitators: Vilma Prader, LRT, CTRS Location:  Dayroom  Group Description: Straw Bridge. Individually, patients were given 10 plastic drinking straws and an equal length of masking tape. Using the materials provided, patients were instructed to build a free-standing bridge-like structure to suspend an everyday item (ex: puzzle box) off the floor or table surface. All materials were required to be used in Conservation officer, nature. LRT facilitated post-activity discussion reviewing how we, humans, are like the structure we built; when things get too heavy in our life and we do not have adequate supports/coping skills, then we will fall just like the straw-built structure will. LRT focused on how having a "base" or structure on the bottom was necessary for the object to stand, meaning we must be secure and stable first before building on ourselves or others. Patients were encouraged to reflect how the skills used in this activity can be generalized to daily life post discharge.  Affect/Mood: Appropriate and Flat   Participation Level: Active and Engaged   Participation Quality: Independent   Behavior: Alert and Appropriate   Speech/Thought Process: Coherent   Insight: Moderate   Judgement: Moderate   Modes of Intervention: Activity   Patient Response to Interventions:  Attentive and Receptive   Education Outcome:  Acknowledges education   Clinical Observations/Individualized Feedback: Victoria [name] was active  in their participation of session activities and group discussion. Pt chose to work in a pair to complete activity section of group. During group, pt shared "Why do you want to frustrate [name]? We dont need anymore of that." jokingly, to the group. LRT gave encouragement and reassurance and that we would discuss at the end of group but that she was eluding to "frustration  tolerance". Pt was pleasant and worked well with peer duration of group.    Plan: Continue to engage patient in RT group sessions 2-3x/week.   [address] LRT, CTRS 08/16/2022 3:11 PM

## 2022-08-16 NOTE — Plan of Care (Signed)
  Problem: Activity: Goal: Risk for activity intolerance will decrease Outcome: Progressing   Problem: Nutrition: Goal: Adequate nutrition will be maintained Outcome: Progressing   Problem: Safety: Goal: Ability to remain free from injury will improve Outcome: Progressing   Problem: Skin Integrity: Goal: Risk for impaired skin integrity will decrease Outcome: Progressing   Problem: Coping: Goal: Level of anxiety will decrease Outcome: Not Progressing   Problem: Pain Managment: Goal: General experience of comfort will improve Outcome: Not Progressing

## 2022-08-16 NOTE — Progress Notes (Signed)
[facility] MD Progress Note  08/16/2022 12:20 PM Victoria [name]  MRN:  [id number] Subjective: Victoria [name] is seen on rounds.  She tells me that she slept better last night.  I discontinued her Remeron and trazodone and started her on doxepin.  She seems to tolerate it pretty well.  She restarted her GI medication and feels a little nauseated today.  She tells me that she was going to start on Abilify and wishes that she had.  She is still feeling depressed.  She had been on Latuda but that gave her akathisia.  I told her that Abilify can do the same thing but that Risperdal in my experience is less agitating and better for anxiety.  She said that she would give it a try.  Principal Problem: Major depressive disorder, recurrent episode, severe (HCC) Diagnosis: Principal Problem:   Major depressive disorder, recurrent episode, severe (HCC) Active Problems:   Passive suicidal ideations  Total Time spent with patient: 15 minutes  Past Psychiatric History:  Long history of depression and has been treated at [facility] outpatient behavioral health.   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 LHC 6/16:  oOM1 60, pRCA 25 >> med Rx b. cath 3/19 2nd OM with 95% stenosis s/p synergy DES & anomalous RCA   CAP (community acquired pneumonia) 2016; 06/07/2016   Chronic bronchitis (HCC)    Chronic lower back pain    Complication of anesthesia    "think I have a hard time waking up from it"   COPD (chronic obstructive pulmonary disease) ([place])    Depression    mild   Diverticulitis    Diverticulosis    GERD (gastroesophageal reflux disease)    H/O hiatal hernia    Headache    "weekly" (08/23/2017)   History of echocardiogram    Echo 6/16:  Mod LVH, EF 60-65%, no RWMA, Gr 1 DD, trivial MR, normal LA size.    Hyperglycemia 11/20/2015   Hyperlipidemia, mixed 09/11/2007   Qualifier: Diagnosis of  By: Jerold Coombe   Did not tolerate Lipitor, zocor, Lovastatin, Pravastatin, Livalo, Crestor even low dose    IBS (irritable bowel syndrome)    Maxillary sinusitis    Normal cardiac stress test 11/2011   No evidence of ischemia or infarct.   Calculated ejection fraction 72%.   Obesity    OSA (obstructive sleep apnea) 02/2012   has stopped using  cpap   Osteoarthritis    Osteoporosis    Pneumonia 11/2011   "before 2013 I hadn't had pneumonia since I was a child" (04/13/2012)   Pulmonary nodules    S/P angioplasty with stent 08/23/17 ostial 2nd OM with DES synnergy 08/24/2017   Schatzki's ring     Past Surgical History:  Procedure Laterality Date   APPENDECTOMY  1989   CARDIAC CATHETERIZATION N/A 11/25/2014   Procedure: Right/Left Heart Cath and Coronary Angiography;  Surgeon: Belva Crome, MD;  Location: [place] [facility];  Service: Cardiovascular;  Laterality: N/A;   [place]   COLONOSCOPY  03/2022   CORONARY ANGIOPLASTY WITH STENT PLACEMENT  08/23/2017   CORONARY STENT INTERVENTION N/A 08/23/2017   Procedure: CORONARY STENT INTERVENTION;  Surgeon: Burnell Blanks, MD;  Location: [place] [facility];  Service: Cardiovascular;  Laterality: N/A;   HERNIA REPAIR  04/13/2012   VHR laparoscopic   LEFT HEART CATH AND CORONARY ANGIOGRAPHY N/A 08/23/2017   Procedure: LEFT HEART CATH AND CORONARY ANGIOGRAPHY;  Surgeon: Burnell Blanks, MD;  Location: [place] [facility];  Service: Cardiovascular;  Laterality: N/A;   VENTRAL HERNIA REPAIR  04/13/2012   Procedure: LAPAROSCOPIC VENTRAL HERNIA;  Surgeon: Adin Hector, MD;  Location: [place];  Service: General;  Laterality: N/A;  laparoscopic repair of incarcerated hernia   Family History:  Family History  Problem Relation Age of Onset   Breast cancer Mother    Hypertension Mother    Diabetes Mother    Cancer Mother         recurrent, metastatic breast cancer.    Diverticulosis Father    Prostate cancer Father    Breast cancer Sister    Cancer Sister        breast cancer, invasive ductal carcinoma in 2022,DCIS at 26 with 4 weeks of radiation, 5 years of Tamoxifen    Pulmonary embolism Brother        recurrent   Heart attack Maternal Grandfather    Cerebral palsy Son    Cancer Niece 66   Osteoporosis Niece    Stroke Neg Hx    Colon cancer Neg Hx    Esophageal cancer Neg Hx    Stomach cancer Neg Hx    Rectal cancer Neg Hx    Family Psychiatric  History: Unremarkable  Social History   Substance and Sexual Activity  Alcohol Use Yes   Comment: social use- last drink 3 weeks ago     Social History   Substance and Sexual Activity  Drug Use No    Social History   Socioeconomic History   Marital status: Married    Spouse name: Not on file   Number of children: 1   Years of education: 14   Highest education level: Associate degree: academic program  Occupational History   Occupation: Disabled   Tobacco Use   Smoking status: Never    Passive exposure: Never   Smokeless tobacco: Never  Vaping Use   Vaping Use: Never used  Substance and Sexual Activity   Alcohol use: Yes    Comment: social use- last drink 3 weeks ago   Drug use: No   Sexual activity: Yes    Comment: gluten free, lives with husband and son with CP quadriplegia  Other Topics Concern   Not on file  Social History Narrative   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    Lives at home with her husband and son.   Right-handed.   2 cups caffeine per day.   One story home   Social Determinants of Health   Financial Resource Strain: Low Risk  (10/19/2021)   Overall Financial Resource Strain (CARDIA)    Difficulty of Paying Living Expenses: Not hard at all  Food Insecurity: No Food Insecurity (08/12/2022)   Hunger Vital Sign    Worried About Running Out of Food in the Last Year: Never true    Ran Out of Food in the Last Year: Never  true  Transportation Needs: No Transportation Needs (08/12/2022)   PRAPARE - Hydrologist (Medical): No    Lack of Transportation (Non-Medical): No  Physical Activity: Insufficiently Active (10/19/2021)   Exercise Vital Sign    Days of Exercise per Week: 7 days    Minutes of Exercise per  Session: 20 min  Stress: Stress Concern Present (10/19/2021)   [place]    Feeling of Stress : Rather much  Social Connections: Moderately Integrated (10/19/2021)   Social Connection and Isolation Panel [NHANES]    Frequency of Communication with Friends and Family: More than three times a week    Frequency of Social Gatherings with Friends and Family: Once a week    Attends Religious Services: More than 4 times per year    Active Member of [employer] or Organizations: No    Attends Music therapist: Never    Marital Status: Married   Additional Social History:                         Sleep: Good  Appetite:  Good  Current Medications: Current Facility-Administered Medications  Medication Dose Route Frequency Provider Last Rate Last Admin   acetaminophen (TYLENOL) tablet 650 mg  650 mg Oral Q6H PRN Lucky Rathke, FNP   650 mg at 08/16/22 0833   albuterol (VENTOLIN HFA) 108 (90 Base) MCG/ACT inhaler 1-2 puff  1-2 puff Inhalation Q6H PRN Parks Ranger, DO   2 puff at 08/13/22 2256   alum & mag hydroxide-simeth (MAALOX/MYLANTA) 200-200-20 MG/5ML suspension 30 mL  30 mL Oral Q4H PRN Lucky Rathke, FNP       budesonide (ENTOCORT EC) 24 hr capsule 3 mg  3 mg Oral Daily Parks Ranger, DO   3 mg at 08/16/22 [id number]   clonazePAM (KLONOPIN) tablet 0.5 mg  0.5 mg Oral BID PRN Lucky Rathke, FNP   0.5 mg at 08/16/22 [id number]   doxepin (SINEQUAN) capsule 50 mg  50 mg Oral QHS Clapacs, John T, MD   50 mg at 08/15/22 2131   famotidine (PEPCID) tablet 20 mg  20 mg Oral Daily Lucky Rathke, FNP    20 mg at 08/16/22 0959   ipratropium-albuterol (DUONEB) 0.5-2.5 (3) MG/3ML nebulizer solution 3 mL  3 mL Nebulization Q6H PRN Lucky Rathke, FNP       magnesium hydroxide (MILK OF MAGNESIA) suspension 30 mL  30 mL Oral Daily PRN Lucky Rathke, FNP       nitroGLYCERIN (NITROSTAT) SL tablet 0.4 mg  0.4 mg Sublingual Q5 min PRN Lucky Rathke, FNP       pindolol (VISKEN) tablet 5 mg  5 mg Oral QPC breakfast Parks Ranger, DO   5 mg at 08/16/22 [id number]   risperiDONE (RISPERDAL) tablet 0.5 mg  0.5 mg Oral [id number] Parks Ranger, DO       trolamine salicylate (ASPERCREME) 10 % cream   Topical BID PRN Parks Ranger, DO       venlafaxine XR (EFFEXOR-XR) 24 hr capsule 225 mg  225 mg Oral Q breakfast Lucky Rathke, FNP   225 mg at 08/16/22 [id number]    Lab Results: No results found. However, due to the size of the patient record, not all encounters were searched. Please check Results Review for a complete set of results.  Blood Alcohol level:  Lab Results  Component Value Date   ETH <10 [id number]    Metabolic Disorder Labs: Lab Results  Component Value Date   HGBA1C 5.6 05/26/2022   MPG 117 10/22/2008   Lab Results  Component Value Date   PROLACTIN 15.4 08/12/2022   Lab Results  Component Value Date   CHOL 208 (H) 05/26/2022  TRIG 113.0 05/26/2022   HDL 74.00 05/26/2022   CHOLHDL 3 05/26/2022   VLDL 22.6 05/26/2022   LDLCALC 111 (H) 05/26/2022   LDLCALC 95 07/06/2021    Physical Findings: AIMS: Facial and Oral Movements Muscles of Facial Expression: None, normal Lips and Perioral Area: Minimal Jaw: None, normal Tongue: None, normal,Extremity Movements Upper (arms, wrists, hands, fingers): None, normal Lower (legs, knees, ankles, toes): None, normal, Trunk Movements Neck, shoulders, hips: None, normal, Overall Severity Severity of abnormal movements (highest score from questions above): Minimal Incapacitation due to abnormal movements: None,  normal Patient's awareness of abnormal movements (rate only patient's report): No Awareness, Dental Status Current problems with teeth and/or dentures?: No Does patient usually wear dentures?: No  CIWA:    COWS:     Musculoskeletal: Strength & Muscle Tone: within normal limits Gait & Station: normal Patient leans: N/A  Psychiatric Specialty Exam:  Presentation  General Appearance:  Appropriate for Environment; Casual  Eye Contact: Good  Speech: Clear and Coherent; Normal Rate  Speech Volume: Normal  Handedness: Right   Mood and Affect  Mood: Anxious; Depressed  Affect: Congruent   Thought Process  Thought Processes: Coherent; Goal Directed; Linear  Descriptions of Associations:Intact  Orientation:Full (Time, Place and Person)  Thought Content:Logical; WDL  History of Schizophrenia/Schizoaffective disorder:No  Duration of Psychotic Symptoms:No data recorded Hallucinations:No data recorded Ideas of Reference:None  Suicidal Thoughts:No data recorded Homicidal Thoughts:No data recorded  Sensorium  Memory: Immediate Good; Recent Fair  Judgment: Fair  Insight: Present   Executive Functions  Concentration: Good  Attention Span: Good  Recall: Good  Fund of Knowledge: Good  Language: Good   Psychomotor Activity  Psychomotor Activity:No data recorded  Assets  Assets: Communication Skills; Desire for Improvement; Housing; Leisure Time; Resilience; Social Support   Sleep  Sleep:No data recorded   Physical Exam: Physical Exam Vitals and nursing note reviewed.  Constitutional:      Appearance: Normal appearance. She is normal weight.  Neurological:     General: No focal deficit present.     Mental Status: She is alert and oriented to person, place, and time.  Psychiatric:        Attention and Perception: Attention and perception normal.        Mood and Affect: Mood is depressed. Affect is flat.        Speech: Speech  normal.        Behavior: Behavior normal. Behavior is cooperative.        Thought Content: Thought content normal.        Cognition and Memory: Cognition and memory normal.        Judgment: Judgment normal.    Review of Systems  Constitutional: Negative.   HENT: Negative.    Eyes: Negative.   Respiratory: Negative.    Cardiovascular: Negative.   Gastrointestinal: Negative.   Genitourinary: Negative.   Musculoskeletal: Negative.   Skin: Negative.   Neurological: Negative.   Endo/Heme/Allergies: Negative.   Psychiatric/Behavioral:  Positive for depression. The patient is nervous/anxious and has insomnia.    Blood pressure (!) 117/59, pulse 79, temperature 98.6 F (37 C), temperature source Oral, resp. rate 18, height '5\' 1"'$  (1.549 m), weight 68.3 kg, SpO2 100 %. Body mass index is 28.44 kg/m.   Treatment Plan Summary: Daily contact with patient to assess and evaluate symptoms and progress in treatment, Medication management, and Plan start Risperdal 0.5 mg twice a day.  Parks Ranger, DO 08/16/2022, 12:20 PM

## 2022-08-16 NOTE — Progress Notes (Signed)
Patient reports the medication (doxepin) she took last night has made her feel very drowsy today. Dr. Louis Meckel made aware.  Per Dr. Louis Meckel, the dose will be cut in half and she will adjust to the medication in time.  Patient in agreement with the plan.

## 2022-08-16 NOTE — Progress Notes (Signed)
Patient pleasant upon approaching nurse's station. Flat affect. Patient reports she slept well.  Endorses anxiety and depression 5/10.  PRN klonopin given after breakfast.  Pain rated 4/10 in her back. Tylenol given.  Denies SI/HI and AVH.  Compliant with scheduled medications.  15 min checks in place.  Present in the milieu socializing with peers. Participated in MHT, social work and recreational therapy groups.   Heel warmer packs applied to patient's back to help manage scoliosis pain.  Dr. Louis Meckel made aware.  New medication ordered for anxiety. See Mar.   Clothing dropped off by volunteer.  Searched and documented by NT.

## 2022-08-16 NOTE — Group Note (Signed)
Date:  08/16/2022 Time:  4:56 PM  Group Topic/Focus:  Goals Group:   The focus of this group is to help patients establish daily goals to achieve during treatment and discuss how the patient can incorporate goal setting into their daily lives to aide in recovery.    Participation Level:  Active  Participation Quality:  Appropriate and Sharing  Affect:  Appropriate  Cognitive:  Alert, Appropriate, and Oriented  Insight: Appropriate and Good  Engagement in Group:  Engaged  Modes of Intervention:  Activity and Discussion  Additional Comments:  Her goal was to learn coping skills.  Ladona Mow 08/16/2022, 4:56 PM

## 2022-08-16 NOTE — Group Note (Signed)
[facility] LCSW Group Therapy Note   Group Date: 08/16/2022 Start Time: 1300 End Time: 1400   Type of Therapy/Topic:  Group Therapy:  Emotion Regulation  Participation Level:  Active   Mood:  Description of Group:    The purpose of this group is to assist patients in learning to regulate negative emotions and experience positive emotions. Patients will be guided to discuss ways in which they have been vulnerable to their negative emotions. These vulnerabilities will be juxtaposed with experiences of positive emotions or situations, and patients challenged to use positive emotions to combat negative ones. Special emphasis will be placed on coping with negative emotions in conflict situations, and patients will process healthy conflict resolution skills.  Therapeutic Goals: Patient will identify two positive emotions or experiences to reflect on in order to balance out negative emotions:  Patient will label two or more emotions that they find the most difficult to experience:  Patient will be able to demonstrate positive conflict resolution skills through discussion or role plays:   Summary of Patient Progress:   Patient was present for the entirety of the group session. Patient was an active listener and participated in the topic of discussion, provided helpful advice to others, and added nuance to topic of conversation. She stated she felt "very frustrated" because she cannot sleep and trying all sorts of medication is exhausting. She stated in the past she would "pray to God" and just keep enduring but she feels worn down of late.     Therapeutic Modalities:   Cognitive Behavioral Therapy Feelings Identification Dialectical Behavioral Therapy   Shi Grose A [place], LCSWA

## 2022-08-17 DIAGNOSIS — R45851 Suicidal ideations: Secondary | ICD-10-CM | POA: Diagnosis not present

## 2022-08-17 NOTE — Group Note (Signed)
Date:  08/17/2022 Time:  10:21 AM  Group Topic/Focus:  Personal Choices and Values:   The focus of this group is to help patients assess and explore the importance of values in their lives, how their values affect their decisions, how they express their values and what opposes their expression.    Participation Level:  Active  Participation Quality:  Appropriate  Affect:  Appropriate  Cognitive:  Alert and Appropriate  Insight: Appropriate  Engagement in Group:  Engaged  Modes of Intervention:  Discussion  Additional Comments:    Ladona Mow 08/17/2022, 10:21 AM

## 2022-08-17 NOTE — Plan of Care (Signed)
  Problem: Education: Goal: Knowledge of General Education information will improve Description: Including pain rating scale, medication(s)/side effects and non-pharmacologic comfort measures Outcome: Progressing   Problem: Health Behavior/Discharge Planning: Goal: Ability to manage health-related needs will improve Outcome: Progressing   Problem: Clinical Measurements: Goal: Ability to maintain clinical measurements within normal limits will improve Outcome: Progressing Goal: Will remain free from infection Outcome: Progressing Goal: Diagnostic test results will improve Outcome: Progressing Goal: Respiratory complications will improve Outcome: Progressing Goal: Cardiovascular complication will be avoided Outcome: Progressing   Problem: Nutrition: Goal: Adequate nutrition will be maintained Outcome: Progressing   Problem: Activity: Goal: Risk for activity intolerance will decrease Outcome: Progressing   Problem: Coping: Goal: Level of anxiety will decrease Outcome: Progressing   Problem: Elimination: Goal: Will not experience complications related to bowel motility Outcome: Progressing Goal: Will not experience complications related to urinary retention Outcome: Progressing   Problem: Skin Integrity: Goal: Risk for impaired skin integrity will decrease Outcome: Progressing

## 2022-08-17 NOTE — Group Note (Signed)
Recreation Therapy Group Note   Group Topic:General Recreation  Group Date: 08/17/2022 Start Time: 1400 End Time: 1445 Facilitators: Vilma Prader, LRT, CTRS Location:  Dayroom  Group Description: Trivia. Patients are split into two groups. LRT reads off trivia question for one team to answer within allotted time. If the first team is unable to answer or answers incorrectly, then the opposite team gets a chance at answering the question. At the end of the game, whichever team answers the most question correctly, wins. LRT facilitated post-game discussion on the importance of working well with others, active listening to others and being a part of a team. LRT and pts discussed how this can apply to life post-discharge.   Affect/Mood: Appropriate and Flat   Participation Level: Active and Engaged   Participation Quality: Independent   Behavior: Appropriate   Speech/Thought Process: Coherent   Insight: Good   Judgement: Good   Modes of Intervention: Activity   Patient Response to Interventions:  Attentive and Receptive   Education Outcome:  Acknowledges education   Clinical Observations/Individualized Feedback: Faithlynn was active in their participation of session activities and group discussion. Pt shared that her son was a participant in a marathon and how one of their family friends ran with him in a stroller during it. Pt affect brightened when she was talking about memories with her son.    Plan: Continue to engage patient in RT group sessions 2-3x/week.   Vilma Prader, LRT, CTRS 08/17/2022 3:02 PM

## 2022-08-17 NOTE — Progress Notes (Signed)
Patient educated/reminded that entering the rooms of others isnt allowed. Patient expresses understanding.

## 2022-08-17 NOTE — Progress Notes (Signed)
   08/17/22 0710  Psych Admission Type (Psych Patients Only)  Admission Status Voluntary  Psychosocial Assessment  Patient Complaints None  Eye Contact Fair  Facial Expression Fixed smile  Affect Anxious  Speech Logical/coherent  Interaction Needy  Motor Activity Slow  Appearance/Hygiene Unremarkable  Behavior Characteristics Cooperative  Mood Anxious  Thought Process  Coherency WDL  Content WDL  Delusions None reported or observed  Perception WDL  Hallucination None reported or observed  Judgment WDL  Confusion None  Danger to Self  Current suicidal ideation? Denies  Danger to Others  Danger to Others None reported or observed

## 2022-08-17 NOTE — Group Note (Signed)
LCSW Group Therapy Note  Group Date: 08/17/2022 Start Time: [id number] End Time: 1400   Type of Therapy and Topic:  Group Therapy - Healthy vs Unhealthy Coping Skills  Participation Level:  Active   Description of Group The focus of this group was to determine what unhealthy coping techniques typically are used by group members and what healthy coping techniques would be helpful in coping with various problems. Patients were guided in becoming aware of the differences between healthy and unhealthy coping techniques. Patients were asked to identify 2-3 healthy coping skills they would like to learn to use more effectively.  Therapeutic Goals Patients learned that coping is what human beings do all day long to deal with various situations in their lives Patients defined and discussed healthy vs unhealthy coping techniques Patients identified their preferred coping techniques and identified whether these were healthy or unhealthy Patients determined 2-3 healthy coping skills they would like to become more familiar with and use more often. Patients provided support and ideas to each other   Summary of Patient Progress:  During group, she expressed that she was looking forward to going home so she could "use her essential oils and gentle stretches."  Patient proved open to input from peers and feedback from [place]. Patient demonstrated good insight into the subject matter, was respectful of peers, and participated throughout the entire session.   Therapeutic Modalities Cognitive Behavioral Therapy Motivational Interviewing  Michelyn Scullin A [place], LCSWA 08/17/2022  2:34 PM

## 2022-08-17 NOTE — Progress Notes (Signed)
   08/16/22 2000  Psych Admission Type (Psych Patients Only)  Admission Status Voluntary  Psychosocial Assessment  Patient Complaints None  Eye Contact Fair  Facial Expression Flat  Affect Flat  Speech Logical/coherent  Interaction Assertive  Motor Activity Slow  Appearance/Hygiene Unremarkable  Behavior Characteristics Appropriate to situation;Cooperative  Mood Pleasant  Thought Process  Coherency WDL  Content WDL  Delusions None reported or observed  Perception WDL  Hallucination None reported or observed  Judgment Impaired  Confusion None  Danger to Self  Current suicidal ideation? Denies  Danger to Others  Danger to Others None reported or observed   Progress note   D: Pt seen in dayroom talking with peer. Pt denies SI, HI, AVH. Pt rates pain  0/10. Pt rates anxiety  0/10 and depression  0/10. Pt managed a smile during the assessment. Says she feels better since the newly prescribed medication for sleep is working. Took PRN earlier for back pain and said it is now gone. Pt active in milieu. No other concerns noted at this time.  A: Pt provided support and encouragement. Pt given scheduled medication as prescribed. PRNs as appropriate. Q15 min checks for safety.   R: Pt safe on the unit. Will continue to monitor.

## 2022-08-17 NOTE — Progress Notes (Signed)
[facility] MD Progress Note  08/17/2022 11:59 AM Victoria [name]  MRN:  [id number] Subjective: Ivory is seen on rounds.  She says that she slept better last night and woke up not as anxious.  We cut her doxepin in half.  She did complain of having nightmares but other than that today she says that she feels much better. Principal Problem: Major depressive disorder, recurrent episode, severe (HCC) Diagnosis: Principal Problem:   Major depressive disorder, recurrent episode, severe (HCC) Active Problems:   Passive suicidal ideations  Total Time spent with patient: 15 minutes  Past Psychiatric History:  Long history of depression and has been treated at [facility] outpatient behavioral health.    Past Medical History:  Past Medical History:  Diagnosis Date   Allergic bronchopulmonary aspergillosis ([place]) 2008   sees Dr Edmund Hilda pulmonology   Anemia    iron deficiency, resolved   Anxiety    Asthma    CAD (coronary artery disease)    a. LHC 6/16:  oOM1 60, pRCA 25 >> med Rx b. cath 3/19 2nd OM with 95% stenosis s/p synergy DES & anomalous RCA   CAP (community acquired pneumonia) 2016; 06/07/2016   Chronic bronchitis (HCC)    Chronic lower back pain    Complication of anesthesia    "think I have a hard time waking up from it"   COPD (chronic obstructive pulmonary disease) ([place])    Depression    mild   Diverticulitis    Diverticulosis    GERD (gastroesophageal reflux disease)    H/O hiatal hernia    Headache    "weekly" (08/23/2017)   History of echocardiogram    Echo 6/16:  Mod LVH, EF 60-65%, no RWMA, Gr 1 DD, trivial MR, normal LA size.   Hyperglycemia 11/20/2015   Hyperlipidemia, mixed 09/11/2007   Qualifier: Diagnosis of  By: Jerold Coombe   Did not tolerate Lipitor, zocor, Lovastatin, Pravastatin, Livalo, Crestor even low dose    IBS (irritable bowel syndrome)    Maxillary sinusitis    Normal cardiac stress test 11/2011   No evidence of ischemia or infarct.   Calculated ejection  fraction 72%.   Obesity    OSA (obstructive sleep apnea) 02/2012   has stopped using  cpap   Osteoarthritis    Osteoporosis    Pneumonia 11/2011   "before 2013 I hadn't had pneumonia since I was a child" (04/13/2012)   Pulmonary nodules    S/P angioplasty with stent 08/23/17 ostial 2nd OM with DES synnergy 08/24/2017   Schatzki's ring     Past Surgical History:  Procedure Laterality Date   APPENDECTOMY  1989   CARDIAC CATHETERIZATION N/A 11/25/2014   Procedure: Right/Left Heart Cath and Coronary Angiography;  Surgeon: Belva Crome, MD;  Location: [place] [facility];  Service: Cardiovascular;  Laterality: N/A;   [place]   COLONOSCOPY  03/2022   CORONARY ANGIOPLASTY WITH STENT PLACEMENT  08/23/2017   CORONARY STENT INTERVENTION N/A 08/23/2017   Procedure: CORONARY STENT INTERVENTION;  Surgeon: Burnell Blanks, MD;  Location: [place] [facility];  Service: Cardiovascular;  Laterality: N/A;   HERNIA REPAIR  04/13/2012   VHR laparoscopic   LEFT HEART CATH AND CORONARY ANGIOGRAPHY N/A 08/23/2017   Procedure: LEFT HEART CATH AND CORONARY ANGIOGRAPHY;  Surgeon: Burnell Blanks, MD;  Location: [place] [facility];  Service: Cardiovascular;  Laterality: N/A;   VENTRAL HERNIA REPAIR  04/13/2012   Procedure: LAPAROSCOPIC VENTRAL HERNIA;  Surgeon:  Adin Hector, MD;  Location: [place];  Service: General;  Laterality: N/A;  laparoscopic repair of incarcerated hernia   Family History:  Family History  Problem Relation Age of Onset   Breast cancer Mother    Hypertension Mother    Diabetes Mother    Cancer Mother        recurrent, metastatic breast cancer.    Diverticulosis Father    Prostate cancer Father    Breast cancer Sister    Cancer Sister        breast cancer, invasive ductal carcinoma in 2022,DCIS at 15 with 4 weeks of radiation, 5 years of Tamoxifen    Pulmonary embolism Brother        recurrent   Heart attack Maternal Grandfather    Cerebral palsy Son     Cancer Niece 19   Osteoporosis Niece    Stroke Neg Hx    Colon cancer Neg Hx    Esophageal cancer Neg Hx    Stomach cancer Neg Hx    Rectal cancer Neg Hx    Family Psychiatric  History: Unremarkable Social History:  Social History   Substance and Sexual Activity  Alcohol Use Yes   Comment: social use- last drink 3 weeks ago     Social History   Substance and Sexual Activity  Drug Use No    Social History   Socioeconomic History   Marital status: Married    Spouse name: Not on file   Number of children: 1   Years of education: 14   Highest education level: Associate degree: academic program  Occupational History   Occupation: Disabled   Tobacco Use   Smoking status: Never    Passive exposure: Never   Smokeless tobacco: Never  Vaping Use   Vaping Use: Never used  Substance and Sexual Activity   Alcohol use: Yes    Comment: social use- last drink 3 weeks ago   Drug use: No   Sexual activity: Yes    Comment: gluten free, lives with husband and son with CP quadriplegia  Other Topics Concern   Not on file  Social History Narrative   Cares for son with cerebral palsy.    Lives at home with her husband and son.   Right-handed.   2 cups caffeine per day.   One story home   Social Determinants of Health   Financial Resource Strain: Low Risk  (10/19/2021)   Overall Financial Resource Strain (CARDIA)    Difficulty of Paying Living Expenses: Not hard at all  Food Insecurity: No Food Insecurity (08/12/2022)   Hunger Vital Sign    Worried About Running Out of Food in the Last Year: Never true    Ran Out of Food in the Last Year: Never true  Transportation Needs: No Transportation Needs (08/12/2022)   PRAPARE - Hydrologist (Medical): No    Lack of Transportation (Non-Medical): No  Physical Activity: Insufficiently Active (10/19/2021)   Exercise Vital Sign    Days of Exercise per Week: 7 days    Minutes of Exercise per Session: 20 min   Stress: Stress Concern Present (10/19/2021)   [place]    Feeling of Stress : Rather much  Social Connections: Moderately Integrated (10/19/2021)   Social Connection and Isolation Panel [NHANES]    Frequency of Communication with Friends and Family: More than three times a week    Frequency of Social Gatherings with  Friends and Family: Once a week    Attends Religious Services: More than 4 times per year    Active Member of [employer] or Organizations: No    Attends Music therapist: Never    Marital Status: Married   Additional Social History:                         Sleep: Good  Appetite:  Good  Current Medications: Current Facility-Administered Medications  Medication Dose Route Frequency Provider Last Rate Last Admin   acetaminophen (TYLENOL) tablet 650 mg  650 mg Oral Q6H PRN Lucky Rathke, FNP   650 mg at 08/17/22 0850   albuterol (VENTOLIN HFA) 108 (90 Base) MCG/ACT inhaler 1-2 puff  1-2 puff Inhalation Q6H PRN Parks Ranger, DO   2 puff at 08/13/22 2256   alum & mag hydroxide-simeth (MAALOX/MYLANTA) 200-200-20 MG/5ML suspension 30 mL  30 mL Oral Q4H PRN Lucky Rathke, FNP       budesonide (ENTOCORT EC) 24 hr capsule 3 mg  3 mg Oral Daily Parks Ranger, DO   3 mg at 08/17/22 0854   clonazePAM (KLONOPIN) tablet 0.5 mg  0.5 mg Oral BID PRN Lucky Rathke, FNP   0.5 mg at 08/17/22 [id number]   doxepin (SINEQUAN) capsule 25 mg  25 mg Oral QHS Parks Ranger, DO   25 mg at 08/16/22 2126   famotidine (PEPCID) tablet 20 mg  20 mg Oral Daily Lucky Rathke, FNP   20 mg at 08/17/22 0852   ipratropium-albuterol (DUONEB) 0.5-2.5 (3) MG/3ML nebulizer solution 3 mL  3 mL Nebulization Q6H PRN Lucky Rathke, FNP       magnesium hydroxide (MILK OF MAGNESIA) suspension 30 mL  30 mL Oral Daily PRN Lucky Rathke, FNP       nitroGLYCERIN (NITROSTAT) SL tablet 0.4 mg  0.4 mg Sublingual Q5 min PRN  Lucky Rathke, FNP       pindolol (VISKEN) tablet 5 mg  5 mg Oral QPC breakfast Parks Ranger, DO   5 mg at 08/17/22 [id number]   risperiDONE (RISPERDAL) tablet 0.5 mg  0.5 mg Oral BH-[id number] Parks Ranger, DO   0.5 mg at 08/17/22 [id number]   trolamine salicylate (ASPERCREME) 10 % cream   Topical BID PRN Parks Ranger, DO       venlafaxine XR (EFFEXOR-XR) 24 hr capsule 225 mg  225 mg Oral Q breakfast Lucky Rathke, FNP   225 mg at 08/17/22 [id number]    Lab Results: No results found. However, due to the size of the patient record, not all encounters were searched. Please check Results Review for a complete set of results.  Blood Alcohol level:  Lab Results  Component Value Date   ETH <10 123456    Metabolic Disorder Labs: Lab Results  Component Value Date   HGBA1C 5.6 05/26/2022   MPG 117 10/22/2008   Lab Results  Component Value Date   PROLACTIN 15.4 08/12/2022   Lab Results  Component Value Date   CHOL 208 (H) 05/26/2022   TRIG 113.0 05/26/2022   HDL 74.00 05/26/2022   CHOLHDL 3 05/26/2022   VLDL 22.6 05/26/2022   LDLCALC 111 (H) 05/26/2022   LDLCALC 95 07/06/2021    Physical Findings: AIMS: Facial and Oral Movements Muscles of Facial Expression: None, normal Lips and Perioral Area: Minimal Jaw: None, normal Tongue: None, normal,Extremity Movements Upper (arms, wrists, hands, fingers): None, normal  Lower (legs, knees, ankles, toes): None, normal, Trunk Movements Neck, shoulders, hips: None, normal, Overall Severity Severity of abnormal movements (highest score from questions above): Minimal Incapacitation due to abnormal movements: None, normal Patient's awareness of abnormal movements (rate only patient's report): No Awareness, Dental Status Current problems with teeth and/or dentures?: No Does patient usually wear dentures?: No  CIWA:    COWS:     Musculoskeletal: Strength & Muscle Tone: within normal limits Gait & Station: normal Patient  leans: N/A  Psychiatric Specialty Exam:  Presentation  General Appearance:  Appropriate for Environment; Casual  Eye Contact: Good  Speech: Clear and Coherent; Normal Rate  Speech Volume: Normal  Handedness: Right   Mood and Affect  Mood: Anxious; Depressed  Affect: Congruent   Thought Process  Thought Processes: Coherent; Goal Directed; Linear  Descriptions of Associations:Intact  Orientation:Full (Time, Place and Person)  Thought Content:Logical; WDL  History of Schizophrenia/Schizoaffective disorder:No  Duration of Psychotic Symptoms:No data recorded Hallucinations:No data recorded Ideas of Reference:None  Suicidal Thoughts:No data recorded Homicidal Thoughts:No data recorded  Sensorium  Memory: Immediate Good; Recent Fair  Judgment: Fair  Insight: Present   Executive Functions  Concentration: Good  Attention Span: Good  Recall: Good  Fund of Knowledge: Good  Language: Good   Psychomotor Activity  Psychomotor Activity:No data recorded  Assets  Assets: Communication Skills; Desire for Improvement; Housing; Leisure Time; Resilience; Social Support   Sleep  Sleep:No data recorded   Physical Exam: Physical Exam Vitals and nursing note reviewed.  Constitutional:      Appearance: Normal appearance. She is normal weight.  Neurological:     General: No focal deficit present.     Mental Status: She is alert and oriented to person, place, and time.  Psychiatric:        Attention and Perception: Attention and perception normal.        Mood and Affect: Mood is depressed. Affect is flat.        Speech: Speech normal.        Behavior: Behavior normal. Behavior is cooperative.        Thought Content: Thought content normal.        Cognition and Memory: Cognition and memory normal.        Judgment: Judgment normal.    Review of Systems  Constitutional: Negative.   HENT: Negative.    Eyes: Negative.   Respiratory:  Negative.    Cardiovascular: Negative.   Gastrointestinal: Negative.   Genitourinary: Negative.   Musculoskeletal: Negative.   Skin: Negative.   Neurological: Negative.   Endo/Heme/Allergies: Negative.   Psychiatric/Behavioral:  Positive for depression.    Blood pressure 118/61, pulse 70, temperature 97.8 F (36.6 C), temperature source Oral, resp. rate 18, height '5\' 1"'$  (1.549 m), weight 68.3 kg, SpO2 100 %. Body mass index is 28.44 kg/m.   Treatment Plan Summary: Daily contact with patient to assess and evaluate symptoms and progress in treatment, Medication management, and Plan continue current medications.  If she has nightmares at night we will consider [employer].  Parks Ranger, DO 08/17/2022, 11:59 AM

## 2022-08-17 NOTE — Progress Notes (Signed)
D: Patient alert and oriented times 4. Pt denies SI, HI, AVH. Pt rates pain  7/10 to lower back- PRN Tylenol given. Pt endorses feelings of mild anxiety and depression. No other concerns noted at this time.   A: Pt provided support and encouragement throughout the day. Scheduled medications given as prescribed. Takes medications whole without issue.    R: Pt remains safe on the unit with Q15 min safety checks in place. Will continue to monitor for changes.

## 2022-08-18 DIAGNOSIS — R45851 Suicidal ideations: Secondary | ICD-10-CM | POA: Diagnosis not present

## 2022-08-18 MED ORDER — PINDOLOL 5 MG PO TABS: 5.0000 mg | ORAL_TABLET | Freq: Every day | ORAL | 3 refills | Status: DC

## 2022-08-18 MED ORDER — DOXEPIN HCL 25 MG PO CAPS: 25.0000 mg | ORAL_CAPSULE | Freq: Every day | ORAL | 3 refills | Status: DC

## 2022-08-18 MED ORDER — CLONAZEPAM 0.5 MG PO TABS: 0.5000 mg | ORAL_TABLET | Freq: Two times a day (BID) | ORAL | 0 refills | Status: DC | PRN

## 2022-08-18 MED ORDER — VENLAFAXINE HCL ER 75 MG PO CP24: 225.0000 mg | ORAL_CAPSULE | Freq: Every day | ORAL | 1 refills | Status: DC

## 2022-08-18 MED ORDER — RISPERIDONE 0.5 MG PO TABS: 0.5000 mg | ORAL_TABLET | ORAL | 3 refills | Status: DC

## 2022-08-18 NOTE — BH Assessment (Signed)
1920 Received patient sitting in the day room visiting with her friend, she appeared to really enjoy the interaction. Will continue to monitor for safety.  2215 Patient is currently denying SI/HI, A/V hallucinations, depression and pain. Report her mood to be "Good", 8/10.  She has become less and less concern about the care of her 67 year old . She also report that she is ready to go home.  0200 Patient has remained resting quietly in be most of this shift. Will continue to monitor patient for safety.  0600 Patient remain asleep no distress noted at this time, Will continue to monitoir patient for safety.

## 2022-08-18 NOTE — Care Management Important Message (Signed)
Important Message  Patient Details  Name: Victoria [name] MRN: [id number] Date of Birth: [birth year]   Medicare Important Message Given:  Yes     Telesa Jeancharles A [place], [place] 08/18/2022, 11:52 AM

## 2022-08-18 NOTE — Progress Notes (Signed)
Patient ID: ADINE MACEK, female   DOB: Mar 27, 1956, 67 y.o.   MRN: [id number]  Patient was discharged from [facility] unit at approx 1305 escorted by staff. Patient denies SI/HI/AVH. Discharge packet to include printed AVS, Suicide Risk Assessment, and Transition Record reviewed with patient. Belongings returned and patient verified receipt with signature. Suicide safety plan completed with a copy kept in chart. Patient wears eyeglasses and were on her person upon exiting.

## 2022-08-18 NOTE — Discharge Summary (Signed)
Physician Discharge Summary Note  Patient:  Victoria [name] is an 67 y.o., female MRN:  [id number] DOB:  [birth year] Patient phone:  [phone] (home)  Patient address:   [facility] [address],  Total Time spent with patient: 1 hour  Date of Admission:  08/12/2022 Date of Discharge: 08/18/2022  Reason for Admission:   Victoria [name] is a 67 year old white female who was voluntarily admitted to inpatient psychiatry for worsening depression and suicidal ideation.  She is originally from [place] and is currently residing in [place] for which she cares for an adult son who has cerebral palsy with her husband.  She currently sees Dr. Nelida Gores outpatient in [place] and she has had some numerous medication changes.  The thing that has worked the best for her has been Effexor XR.  But it stopped working.  She has recently been on Latuda and Zyprexa and seems to have developed a little bit of akathisia.  She endorses anhedonia, difficulty sleeping, anxiety, depressed mood and hopelessness.  She said that the Effexor just stopped working but recently was increased to 225.  I talked to her about pindolol and how that can help with her akathisia and increase the effectiveness of her antidepressant.  She does not have any past suicide attempts.  She has been in [place] since 2004.  Her recent visit with Dr. Nelida Gores is below which was dated 08/11/2022:   Victoria [name] is a 67 y.o. female with a history of anxiety, depression, OSA who presented to [facility] for initial evaluation on 06/30/2022.   At initial evaluation patient reported symptoms of anxiety including feeling nervous or on edge, being unable to stop controlled worrying, worrying too much about different things, and fear that something awful would happen.  Patient's symptoms have been gradually progressing.  There was a period in November 2023 where she was having passive SI without  intent or plan.  Patient also did endorse low mood, poor sleep, difficulty concentrating.  She does have a history of obstructive sleep apnea and does not use CPAP with last sleep study being over 10 years ago. Psychosocially patient has increased stress of caring for her adult son with cerebral palsy and planning the next steps for him after she and her husband pass or are no longer able to care for him.  Patient met criteria for MDD and generalized anxiety disorder. Of note on exam patient was found to have smacking of her lips repetitive movements of her mouth which are concerning for parkinsonism secondary to Zyprexa.     Victoria [name] presents for follow-up evaluation. Today, 08/11/22, patient reports continued anxiety and depression over the past week.  She had initially called in the interim due to excessive akathisia on Latuda leading to his discontinuing the medication.  At that time she however thought her mood symptoms had been improving.  Unfortunately patient reported a decline in her mood and increase in her anxiety along with an improvement in her akathisia symptoms after discontinuing Latuda.  She currently also feels like there might be some signs of increased restlessness and fidgeting which she attributes to the increased Effexor.  It is possible that she is experiencing some signs of serotonin activation which can improve over time.  Patient was encouraged to continue on the medication for a full 6 weeks before tapering the dose.  Patient also feels that trazodone has not been as effective for sleep and would like to switch back to  Ativan.  We explained that while this is an option we would have to discontinue Klonopin and she was in agreement.  Patient is aware of the risk and benefits of this.  We will also look into [place] as an option for this patient.  Principal Problem: Major depressive disorder, recurrent episode, severe ([place]) Discharge Diagnoses: Principal Problem:   Major depressive  disorder, recurrent episode, severe ([place]) Active Problems:   Passive suicidal ideations   Past Psychiatric History: Long history of depression and has been treated at [facility] outpatient behavioral health.     Past Medical History:  Past Medical History:  Diagnosis Date   Allergic bronchopulmonary aspergillosis ([place]) 2008   sees Dr Edmund Hilda pulmonology   Anemia    iron deficiency, resolved   Anxiety    Asthma    CAD (coronary artery disease)    a. LHC 6/16:  oOM1 60, pRCA 25 >> med Rx b. cath 3/19 2nd OM with 95% stenosis s/p synergy DES & anomalous RCA   CAP (community acquired pneumonia) 2016; 06/07/2016   Chronic bronchitis (HCC)    Chronic lower back pain    Complication of anesthesia    "think I have a hard time waking up from it"   COPD (chronic obstructive pulmonary disease) ([place])    Depression    mild   Diverticulitis    Diverticulosis    GERD (gastroesophageal reflux disease)    H/O hiatal hernia    Headache    "weekly" (08/23/2017)   History of echocardiogram    Echo 6/16:  Mod LVH, EF 60-65%, no RWMA, Gr 1 DD, trivial MR, normal LA size.   Hyperglycemia 11/20/2015   Hyperlipidemia, mixed 09/11/2007   Qualifier: Diagnosis of  By: Jerold Coombe   Did not tolerate Lipitor, zocor, Lovastatin, Pravastatin, Livalo, Crestor even low dose    IBS (irritable bowel syndrome)    Maxillary sinusitis    Normal cardiac stress test 11/2011   No evidence of ischemia or infarct.   Calculated ejection fraction 72%.   Obesity    OSA (obstructive sleep apnea) 02/2012   has stopped using  cpap   Osteoarthritis    Osteoporosis    Pneumonia 11/2011   "before 2013 I hadn't had pneumonia since I was a child" (04/13/2012)   Pulmonary nodules    S/P angioplasty with stent 08/23/17 ostial 2nd OM with DES synnergy 08/24/2017   Schatzki's ring     Past Surgical History:  Procedure Laterality Date   APPENDECTOMY  1989   CARDIAC CATHETERIZATION N/A 11/25/2014   Procedure: Right/Left  Heart Cath and Coronary Angiography;  Surgeon: Belva Crome, MD;  Location: [facility];  Service: Cardiovascular;  Laterality: N/A;   [place]   COLONOSCOPY  03/2022   CORONARY ANGIOPLASTY WITH STENT PLACEMENT  08/23/2017   CORONARY STENT INTERVENTION N/A 08/23/2017   Procedure: CORONARY STENT INTERVENTION;  Surgeon: Burnell Blanks, MD;  Location: [facility];  Service: Cardiovascular;  Laterality: N/A;   HERNIA REPAIR  04/13/2012   VHR laparoscopic   LEFT HEART CATH AND CORONARY ANGIOGRAPHY N/A 08/23/2017   Procedure: LEFT HEART CATH AND CORONARY ANGIOGRAPHY;  Surgeon: Burnell Blanks, MD;  Location: [facility];  Service: Cardiovascular;  Laterality: N/A;   VENTRAL HERNIA REPAIR  04/13/2012   Procedure: LAPAROSCOPIC VENTRAL HERNIA;  Surgeon: Adin Hector, MD;  Location: [facility];  Service: General;  Laterality: N/A;  laparoscopic repair of incarcerated hernia  Family History:  Family History  Problem Relation Age of Onset   Breast cancer Mother    Hypertension Mother    Diabetes Mother    Cancer Mother        recurrent, metastatic breast cancer.    Diverticulosis Father    Prostate cancer Father    Breast cancer Sister    Cancer Sister        breast cancer, invasive ductal carcinoma in 2022,DCIS at 32 with 4 weeks of radiation, 5 years of Tamoxifen    Pulmonary embolism Brother        recurrent   Heart attack Maternal Grandfather    Cerebral palsy Son    Cancer Niece 16   Osteoporosis Niece    Stroke Neg Hx    Colon cancer Neg Hx    Esophageal cancer Neg Hx    Stomach cancer Neg Hx    Rectal cancer Neg Hx    Family Psychiatric  History: Unremarkable Social History:  Social History   Substance and Sexual Activity  Alcohol Use Yes   Comment: social use- last drink 3 weeks ago     Social History   Substance and Sexual Activity  Drug Use No    Social History   Socioeconomic History   Marital status: Married     Spouse name: Not on file   Number of children: 1   Years of education: 14   Highest education level: Associate degree: academic program  Occupational History   Occupation: Disabled   Tobacco Use   Smoking status: Never    Passive exposure: Never   Smokeless tobacco: Never  Vaping Use   Vaping Use: Never used  Substance and Sexual Activity   Alcohol use: Yes    Comment: social use- last drink 3 weeks ago   Drug use: No   Sexual activity: Yes    Comment: gluten free, lives with husband and son with CP quadriplegia  Other Topics Concern   Not on file  Social History Narrative   Cares for son with cerebral palsy.    Lives at home with her husband and son.   Right-handed.   2 cups caffeine per day.   One story home   Social Determinants of Health   Financial Resource Strain: Low Risk  (10/19/2021)   Overall Financial Resource Strain (CARDIA)    Difficulty of Paying Living Expenses: Not hard at all  Food Insecurity: No Food Insecurity (08/12/2022)   Hunger Vital Sign    Worried About Running Out of Food in the Last Year: Never true    Ran Out of Food in the Last Year: Never true  Transportation Needs: No Transportation Needs (08/12/2022)   PRAPARE - Hydrologist (Medical): No    Lack of Transportation (Non-Medical): No  Physical Activity: Insufficiently Active (10/19/2021)   Exercise Vital Sign    Days of Exercise per Week: 7 days    Minutes of Exercise per Session: 20 min  Stress: Stress Concern Present (10/19/2021)   [place]    Feeling of Stress : Rather much  Social Connections: Moderately Integrated (10/19/2021)   Social Connection and Isolation Panel [NHANES]    Frequency of Communication with Friends and Family: More than three times a week    Frequency of Social Gatherings with Friends and Family: Once a week    Attends Religious Services: More than 4 times per year    Active  Member of Clubs or Organizations: No    Attends Archivist Meetings: Never    Marital Status: Married    Hospital Course: Simra is a 67 year old white female who was voluntarily admitted to geriatric psychiatry for worsening depression and suicidal ideation.  Jeroline has a long history of depression and is currently being followed by outpatient psychiatry.  Her Effexor XR was increased to 225 mg prior to admission.  We kept her on that dose.  She had been on Latuda was complaining of feeling restless so we started her on pindolol for akathisia and SSRI poop out.  She has struggled with antidepressants not working overtime.  Initially, she was started on Remeron and trazodone but she was not sleeping.  We changed it to doxepin 50 mg at bedtime and helped her sleep but she woke up feeling hung over so we decreased it to 25 mg and that seemed to help.  She participated in groups and individual therapy.  Physical therapy saw her for her scoliosis and back pain.  Risperdal was added for her depression and negative thoughts.  That seemed to help.  Overall she improved throughout her stay.  Her depression improved.  She believed that the medications were helping.  It was felt that she maximized hospitalization and she was discharged home.  On the day of discharge she denied suicidal ideation, homicidal ideation, auditory or visual hallucinations.  Her judgment and insight were good.  Physical Findings: AIMS: Facial and Oral Movements Muscles of Facial Expression: None, normal Lips and Perioral Area: Minimal Jaw: None, normal Tongue: None, normal,Extremity Movements Upper (arms, wrists, hands, fingers): None, normal Lower (legs, knees, ankles, toes): None, normal, Trunk Movements Neck, shoulders, hips: None, normal, Overall Severity Severity of abnormal movements (highest score from questions above): Minimal Incapacitation due to abnormal movements: None, normal Patient's awareness of abnormal  movements (rate only patient's report): No Awareness, Dental Status Current problems with teeth and/or dentures?: No Does patient usually wear dentures?: No  CIWA:    COWS:     Musculoskeletal: Strength & Muscle Tone: within normal limits Gait & Station: normal Patient leans: N/A   Psychiatric Specialty Exam:  Presentation  General Appearance:  Appropriate for Environment; Casual  Eye Contact: Good  Speech: Clear and Coherent; Normal Rate  Speech Volume: Normal  Handedness: Right   Mood and Affect  Mood: Anxious; Depressed  Affect: Congruent   Thought Process  Thought Processes: Coherent; Goal Directed; Linear  Descriptions of Associations:Intact  Orientation:Full (Time, Place and Person)  Thought Content:Logical; WDL  History of Schizophrenia/Schizoaffective disorder:No  Duration of Psychotic Symptoms:No data recorded Hallucinations:No data recorded Ideas of Reference:None  Suicidal Thoughts:No data recorded Homicidal Thoughts:No data recorded  Sensorium  Memory: Immediate Good; Recent Fair  Judgment: Fair  Insight: Present   Executive Functions  Concentration: Good  Attention Span: Good  Recall: Good  Fund of Knowledge: Good  Language: Good   Psychomotor Activity  Psychomotor Activity:No data recorded  Assets  Assets: Communication Skills; Desire for Improvement; Housing; Leisure Time; Resilience; Social Support   Sleep  Sleep:No data recorded   Physical Exam: Physical Exam Vitals and nursing note reviewed.  Constitutional:      Appearance: Normal appearance. She is normal weight.  Neurological:     General: No focal deficit present.     Mental Status: She is alert and oriented to person, place, and time.  Psychiatric:        Attention and Perception: Attention and perception normal.  Mood and Affect: Mood and affect normal.        Speech: Speech normal.        Behavior: Behavior normal. Behavior is  cooperative.        Thought Content: Thought content normal.        Cognition and Memory: Cognition and memory normal.        Judgment: Judgment normal.    Review of Systems  Constitutional: Negative.   HENT: Negative.    Eyes: Negative.   Respiratory: Negative.    Cardiovascular: Negative.   Gastrointestinal: Negative.   Genitourinary: Negative.   Musculoskeletal: Negative.   Skin: Negative.   Neurological: Negative.   Endo/Heme/Allergies: Negative.   Psychiatric/Behavioral: Negative.     Blood pressure 137/67, pulse 80, temperature 98.5 F (36.9 C), temperature source Oral, resp. rate 18, height '5\' 1"'$  (1.549 m), weight 68.3 kg, SpO2 100 %. Body mass index is 28.44 kg/m.   Social History   Tobacco Use  Smoking Status Never   Passive exposure: Never  Smokeless Tobacco Never   Tobacco Cessation:  N/A, patient does not currently use tobacco products   Blood Alcohol level:  Lab Results  Component Value Date   ETH <10 123456    Metabolic Disorder Labs:  Lab Results  Component Value Date   HGBA1C 5.6 05/26/2022   MPG 117 10/22/2008   Lab Results  Component Value Date   PROLACTIN 15.4 08/12/2022   Lab Results  Component Value Date   CHOL 208 (H) 05/26/2022   TRIG 113.0 05/26/2022   HDL 74.00 05/26/2022   CHOLHDL 3 05/26/2022   VLDL 22.6 05/26/2022   LDLCALC 111 (H) 05/26/2022   [place] 95 07/06/2021    See Psychiatric Specialty Exam and Suicide Risk Assessment completed by Attending Physician prior to discharge.  Discharge destination:  Home  Is patient on multiple antipsychotic therapies at discharge:  No   Has Patient had three or more failed trials of antipsychotic monotherapy by history:  No  Recommended Plan for Multiple Antipsychotic Therapies: NA   Allergies as of 08/18/2022       Reactions   Latuda [lurasidone Hcl] Other (See Comments)   PER THE PT CAUSED RESTLESSNESS   Beclomethasone Dipropionate Hives, Other (See Comments)     weight gain   Flexeril [cyclobenzaprine] Anxiety   Mometasone Furo-formoterol Fum Hives, Other (See Comments)   weight gain   Sulfonamide Derivatives Hives, Rash   Statins Other (See Comments)   Myalgias, RLS        Medication List     STOP taking these medications    LORazepam 1 MG tablet Commonly known as: ATIVAN   traZODone 50 MG tablet Commonly known as: DESYREL       TAKE these medications      Indication  albuterol 108 (90 Base) MCG/ACT inhaler Commonly known as: VENTOLIN HFA Inhale 1-2 puffs into the lungs every 6 (six) hours as needed.    aspirin EC 81 MG tablet Take 81 mg by mouth daily. Swallow whole.    budesonide 3 MG 24 hr capsule Commonly known as: ENTOCORT EC Take 3 caps a day for 4 weeks then take 2 caps daily for 4 weeks then take 1 capsule daily for 4 weeks. What changed:  how much to take how to take this when to take this additional instructions    CALCIUM MAGNESIUM PO Take 1 tablet by mouth at bedtime.    clonazePAM 0.5 MG tablet Commonly known as: KLONOPIN Take 1  tablet (0.5 mg total) by mouth 2 (two) times daily as needed (anxiety). What changed:  when to take this reasons to take this    doxepin 25 MG capsule Commonly known as: SINEQUAN Take 1 capsule (25 mg total) by mouth at bedtime.  Indication: Feeling Anxious, Depression, Sleep   ESTER C PO Take 500 mg by mouth at bedtime.    famotidine 20 MG tablet Commonly known as: PEPCID TAKE 1 TABLET BY MOUTH TWICE A DAY What changed: when to take this    Fasenra Pen 30 MG/ML Soaj Generic drug: Benralizumab Inject 1 mL (30 mg total) into the skin every 8 (eight) weeks.    fexofenadine 180 MG tablet Commonly known as: ALLEGRA Take 180 mg by mouth daily as needed for allergies or rhinitis.    fluticasone 50 MCG/ACT nasal spray Commonly known as: FLONASE Place 1 spray into both nostrils daily as needed for allergies or rhinitis.    ipratropium-albuterol 0.5-2.5 (3) MG/3ML  Soln Commonly known as: DUONEB Take 3 mLs by nebulization every 6 (six) hours as needed. What changed: reasons to take this    nitroGLYCERIN 0.4 MG SL tablet Commonly known as: NITROSTAT PLACE 1 TABLET UNDER THE TONGUE EVERY 5 MINUTES AS NEEDED FOR CHEST PAIN. What changed:  how much to take when to take this reasons to take this additional instructions    pindolol 5 MG tablet Commonly known as: VISKEN Take 1 tablet (5 mg total) by mouth daily after breakfast. Start taking on: August 19, 2022  Indication: Akathesia and SSRI Effectiveness   Repatha SureClick [id number] MG/ML Soaj Generic drug: Evolocumab INJECT 1 PEN INTO THE SKIN EVERY 14 DAYS What changed:  how much to take how to take this when to take this additional instructions    risperiDONE 0.5 MG tablet Commonly known as: RISPERDAL Take 1 tablet (0.5 mg total) by mouth 2 (two) times daily at 8 am and 4 pm.  Indication: Major Depressive Disorder   venlafaxine XR 75 MG 24 hr capsule Commonly known as: Effexor XR Take 3 capsules (225 mg total) by mouth daily with breakfast.  Indication: Major Depressive Disorder   VITAMIN D PO Take 5,000 Units by mouth at bedtime.         Follow-up Information     [facility] Follow up on 08/26/2022.   Specialty: Behavioral Health Why: You have a follow up scheduled with Dr. Nelida Gores on Friday March 8th at 8:30AM. Please contact their office if needed to reschedule. Thanks! Contact information: [place] [phone]                Follow-up recommendations: Dr. Nelida Gores    Signed: Parks Ranger, DO 08/18/2022, 11:27 AM

## 2022-08-18 NOTE — Progress Notes (Signed)
  [facility] Adult Case Management Discharge Plan :  Will you be returning to the same living situation after discharge:  Yes,  pt will be returning home At discharge, do you have transportation home?: Yes,  pt's spouse will be providing transportation home Do you have the ability to pay for your medications: Yes,  pt has Medicare with [employer]  Release of information consent forms completed and in the chart;  Patient's signature needed at discharge.  Patient to Follow up at:  Follow-up Information     [facility] Follow up on 08/26/2022.   Specialty: Behavioral Health Why: You have Victoria follow up scheduled with Dr. Nelida Gores on Friday March 8th at 8:30AM. Please contact their office if needed to reschedule. Thanks! Contact information: [place] Follow up on 08/18/2022.   Why: Please contact your therapist today to reschedule your follow up appointment. Thanks! Contact information: PHONE: [phone] PHYSICAL ADDRESS:  [address] [place], [place], [place] [address]  MAILING ADDRESS: [address], [place],  [address]                Next level of care provider has access to [place] and Suicide Prevention discussed: Yes,  SPE completed with pt's husband  Have you used any form of tobacco in the last 30 days? (Cigarettes, Smokeless Tobacco, Cigars, and/or Pipes): No  Has patient been referred to the Quitline?: N/Victoria patient is not Victoria smoker  Patient has been referred for addiction treatment: N/Victoria  Victoria [name] Victoria [name], Victoria [name] 08/18/2022, 11:53 AM

## 2022-08-18 NOTE — BHH Suicide Risk Assessment (Signed)
[facility] Discharge Suicide Risk Assessment   Principal Problem: Major depressive disorder, recurrent episode, severe ([employer]) Discharge Diagnoses: Principal Problem:   Major depressive disorder, recurrent episode, severe ([place]) Active Problems:   Passive suicidal ideations   Total Time spent with patient: 1 hour  Musculoskeletal: Strength & Muscle Tone: within normal limits Gait & Station: normal Patient leans: N/A  Psychiatric Specialty Exam  Presentation  General Appearance:  Appropriate for Environment; Casual  Eye Contact: Good  Speech: Clear and Coherent; Normal Rate  Speech Volume: Normal  Handedness: Right   Mood and Affect  Mood: Anxious; Depressed  Duration of Depression Symptoms: Greater than two weeks  Affect: Congruent   Thought Process  Thought Processes: Coherent; Goal Directed; Linear  Descriptions of Associations:Intact  Orientation:Full (Time, Place and Person)  Thought Content:Logical; WDL  History of Schizophrenia/Schizoaffective disorder:No  Duration of Psychotic Symptoms:No data recorded Hallucinations:No data recorded Ideas of Reference:None  Suicidal Thoughts:No data recorded Homicidal Thoughts:No data recorded  Sensorium  Memory: Immediate Good; Recent Fair  Judgment: Fair  Insight: Present   Executive Functions  Concentration: Good  Attention Span: Good  Recall: Good  Fund of Knowledge: Good  Language: Good   Psychomotor Activity  Psychomotor Activity:No data recorded  Assets  Assets: Communication Skills; Desire for Improvement; Housing; Leisure Time; Resilience; Social Support   Sleep  Sleep:No data recorded   Blood pressure 137/67, pulse 80, temperature 98.5 F (36.9 C), temperature source Oral, resp. rate 18, height '5\' 1"'$  (1.549 m), weight 68.3 kg, SpO2 100 %. Body mass index is 28.44 kg/m.  Mental Status Per Nursing Assessment::   On Admission:  NA  Demographic Factors:  Age 67 or  older and Caucasian  Loss Factors: NA  Historical Factors: NA  Risk Reduction Factors:   Sense of responsibility to family, Living with another person, especially a relative, Positive social support, Positive therapeutic relationship, and Positive coping skills or problem solving skills  Continued Clinical Symptoms:  Depression:   Anhedonia  Cognitive Features That Contribute To Risk:  None    Suicide Risk:  Minimal: No identifiable suicidal ideation.  Patients presenting with no risk factors but with morbid ruminations; may be classified as minimal risk based on the severity of the depressive symptoms   Follow-up [facility] Follow up on 08/26/2022.   Specialty: Behavioral Health Why: You have a follow up scheduled with Dr. Nelida Gores on Friday March 8th at 8:30AM. Please contact their office if needed to reschedule. Thanks! Contact information: [place] [phone]                Plan Of Care/Follow-up recommendations: Dr. Lawerance Cruel, DO 08/18/2022, 11:12 AM

## 2022-08-19 ENCOUNTER — Telehealth: Payer: Self-pay | Admitting: *Deleted

## 2022-08-19 NOTE — Transitions of Care (Post Inpatient/ED Visit) (Signed)
08/19/2022  Name: Victoria [name] MRN: [id number] DOB: 1955/09/06  Today's TOC FU Call Status: Today's TOC FU Call Status:: Successful TOC FU Call Competed TOC FU Call Complete Date: 08/19/22  Transition Care Management Follow-up Telephone Call Date of Discharge: 08/18/22 Discharge Facility: [facility]) Type of Discharge: Inpatient Admission Primary Inpatient Discharge Diagnosis:: Major Depressive disorder How have you been since you were released from the hospital?: Better ("Im feeling better; not having any problems.  I am established with my psychiatric provider and have an appointment a week from today.  I really don;'t want the social worker calling me, but I will take your number and call you back if I change my mind") Any questions or concerns?: No  Items Reviewed: Did you receive and understand the discharge instructions provided?: Yes (thoroughly reviewed with patient today who verbalizes good understanding of same) Medications obtained and verified?: Yes (Medications Reviewed) (full medication review completed; no concerns or discrepancies identified; patient denies concerns/ questions around medications/ self manages medications; medication list in EHR updated according to pt. report) Any new allergies since your discharge?: No Dietary orders reviewed?: No Do you have support at home?: Yes People in Home: spouse, child(ren), dependent Name of Support/Comfort Primary Source: lives with family members who assist as needed/ indicated; has dependent adult son whom she provides total care for (CP/ quadriplegia); patient reports she is completely independent in self care activities  Home Care and Equipment/Supplies: [place] Ordered?: No Any new equipment or medical supplies ordered?: No  Functional Questionnaire: Do you need assistance with bathing/showering or dressing?: No Do you need assistance with meal preparation?: No Do you need  assistance with eating?: No Do you have difficulty maintaining continence: No Do you need assistance with getting out of bed/getting out of a chair/moving?: No Do you have difficulty managing or taking your medications?: No  Folllow up appointments reviewed: PCP Follow-up appointment confirmed?: Yes Date of PCP follow-up appointment?: 09/05/22 Follow-up Provider: PCP [facility] Follow-up appointment confirmed?: Yes Date of Specialist follow-up appointment?: 08/26/22 Follow-Up Specialty Provider:: Psychiatry, Dr. Nelida Gores Do you need transportation to your follow-up appointment?: No Do you understand care options if your condition(s) worsen?: Yes-patient verbalized understanding  SDOH Interventions Today    Flowsheet Row Most Recent Value  SDOH Interventions   Food Insecurity Interventions Intervention Not Indicated  Transportation Interventions Intervention Not Indicated  [drives self]  Depression Interventions/Treatment  Currently on Treatment, Counseling, Medication  [patient declines full depression screening today,  states "better" after recent IP hospitalization,  confirms has hospital follow up visit with established psychiatry provider on 08/26/22,  declines outreach referral to LCSW]      TOC Interventions Today    Flowsheet Row Most Recent Value  TOC Interventions   TOC Interventions Discussed/Reviewed TOC Interventions Discussed  [pt. declines LCSW referral,  provided my direct telephone number should she change her mind/ should questions/ concerns/ needs arise post- TOC call today]      Interventions Today    Flowsheet Row Most Recent Value  Chronic Disease   Chronic disease during today's visit Other  [major depression with passive SI]  General Interventions   General Interventions Discussed/Reviewed General Interventions Discussed, Doctor Visits  Doctor Visits Discussed/Reviewed Doctor Visits Discussed, Specialist, PCP  PCP/Specialist Visits Compliance with  follow-up visit  [place] Discussed/Reviewed Mental Health Discussed, Depression  [depression screening updated]  Pharmacy Interventions   Pharmacy Dicussed/Reviewed Pharmacy Topics Discussed  [Full medication  review]      Oneta Rack, RN, BSN, CCRN Alumnus RN CM Care Coordination/ Transition of [place] Management [phone]: direct office

## 2022-08-26 ENCOUNTER — Encounter (HOSPITAL_COMMUNITY): Payer: Self-pay | Admitting: Psychiatry

## 2022-08-26 ENCOUNTER — Telehealth (HOSPITAL_BASED_OUTPATIENT_CLINIC_OR_DEPARTMENT_OTHER): Payer: PPO | Admitting: Psychiatry

## 2022-08-26 ENCOUNTER — Encounter: Payer: Self-pay | Admitting: Internal Medicine

## 2022-08-26 ENCOUNTER — Telehealth (INDEPENDENT_AMBULATORY_CARE_PROVIDER_SITE_OTHER): Payer: PPO | Admitting: Internal Medicine

## 2022-08-26 VITALS — BP 110/82 | HR 80 | Ht 61.0 in | Wt 153.0 lb

## 2022-08-26 DIAGNOSIS — F32A Depression, unspecified: Secondary | ICD-10-CM

## 2022-08-26 DIAGNOSIS — K219 Gastro-esophageal reflux disease without esophagitis: Secondary | ICD-10-CM | POA: Diagnosis not present

## 2022-08-26 DIAGNOSIS — K52832 Lymphocytic colitis: Secondary | ICD-10-CM

## 2022-08-26 DIAGNOSIS — G4733 Obstructive sleep apnea (adult) (pediatric): Secondary | ICD-10-CM

## 2022-08-26 DIAGNOSIS — F419 Anxiety disorder, unspecified: Secondary | ICD-10-CM

## 2022-08-26 MED ORDER — CLONAZEPAM 0.5 MG PO TABS: 0.5000 mg | ORAL_TABLET | Freq: Two times a day (BID) | ORAL | 0 refills | Status: DC | PRN

## 2022-08-26 MED ORDER — BUDESONIDE 3 MG PO CPEP: 3.0000 mg | ORAL_CAPSULE | Freq: Every day | ORAL | 5 refills | Status: DC

## 2022-08-26 NOTE — Patient Instructions (Signed)
Continue budesonide 3 mg daily x 6 months.  Follow up with Dr Hilarie Fredrickson in 6 months.  _______________________________________________________  If your blood pressure at your visit was 140/90 or greater, please contact your primary care physician to follow up on this.  _______________________________________________________  If you are age 66 or older, your body mass index should be between 23-30. Your Body mass index is 28.91 kg/m. If this is out of the aforementioned range listed, please consider follow up with your Primary Care Provider.  If you are age 37 or younger, your body mass index should be between 19-25. Your Body mass index is 28.91 kg/m. If this is out of the aformentioned range listed, please consider follow up with your Primary Care Provider.   ________________________________________________________  The [place] providers would like to encourage you to use [facility] to communicate with providers for non-urgent requests or questions.  Due to long hold times on the telephone, sending your provider a message by [facility] may be a faster and more efficient way to get a response.  Please allow 48 business hours for a response.  Please remember that this is for non-urgent requests.  _______________________________________________________  Due to recent changes in healthcare laws, you may see the results of your imaging and laboratory studies on MyChart before your provider has had a chance to review them.  We understand that in some cases there may be results that are confusing or concerning to you. Not all laboratory results come back in the same time frame and the provider may be waiting for multiple results in order to interpret others.  Please give [name] 48 hours in order for your provider to thoroughly review all the results before contacting the office for clarification of your results.

## 2022-08-26 NOTE — Progress Notes (Signed)
[place] MD/PA/NP OP Progress Note  08/26/2022 8:13 AM [name]  MRN:  [id number]  Visit Diagnosis:    ICD-10-CM   1. Anxiety and depression  F41.9    F32.A     2. OSA (obstructive sleep apnea)  G47.33       Assessment: Victoria [name] is a 67 y.o. female with a history of anxiety, depression, OSA who presented to [facility] for initial evaluation on 06/30/2022.  At initial evaluation patient reported symptoms of anxiety including feeling nervous or on edge, being unable to stop controlled worrying, worrying too much about different things, and fear that something awful would happen.  Patient's symptoms have been gradually progressing.  There was a period in November 2023 where she was having passive SI without intent or plan.  Patient also did endorse low mood, poor sleep, difficulty concentrating.  She does have a history of obstructive sleep apnea and does not use CPAP with last sleep study being over 10 years ago. Psychosocially patient has increased stress of caring for her adult son with cerebral palsy and planning the next steps for him after she and her husband pass or are no longer able to care for him.  Patient met criteria for MDD and generalized anxiety disorder. Of note on exam patient was found to have smacking of her lips repetitive movements of her mouth which are concerning for parkinsonism secondary to Zyprexa.    [name] presents for follow-up evaluation. Today, 08/26/22, patient reports improvement in mood, anxiety, and sleep symptoms after her hospitalization in February.  Patient had been recommended to present to the hospital for potential hospitalization after her symptoms were getting progressively worse.  During hospitalization she was started on Risperdal, doxepin, Klonopin 0.5 twice daily as needed, and pindolol.  Ativan was also discontinued.  She is taking the morning dose of Klonopin regularly and has used the nighttime dose once  in the past 2 weeks.  Ellnora denies any adverse side effects from the medications.  We will continue on her current regimen at this time and can consider recommending using the Klonopin and morning dose on an as needed basis in a month if patients symptoms remain stable.  Plan: - Continue Effexor XR 225 mg QD - Continue Risperdal 0.5 mg BID - Continue Doxepin 25 mg QHS - Continue klonopin 0.5 mg BID prn for anxiety - Continue pindolol 5 mg QD - Discontinue Ativan - Recommend repeat sleep study, could consider dental devices OSA still present - CMP, CBC, lipid profile, Vit D, A1c, TSH reviewed - Continue with therapy every week through [place] therapy - Joining a 10 week wellness group through the [facility], later will have a group for special needs parents - Crisis resources reviewed - Follow up in a month    Chief Complaint:  Chief Complaint  Patient presents with   Follow-up   HPI: Patient had called in the interim with symptoms of increased anxiety and restlessness. Based on patients continued decline she was encouraged to present to [facility] for evaluation and potential psychiatric admission. Norabelle was admitted th [facility] psychiatric unit from 2/23-2/29 during which time her medication regimen was adjusted and her symptoms stabilized. While there she was started on Risperdal, pindolol, Doxepin, and Klonopin, while Ativan was discontinued. Remeron was also retried with poor effect.   [name] presents reporting she is feeling a lot better after going to the hospital.  She has been out for about a week now  and notes that her mood, anxiety, and sleep are improved back to her baseline.  She has noted that the medications tried in the hospital were beneficial and she is unaware of any side effects at this time.  She also notes that she has only been using the morning dose of Klonopin regularly.  While the nighttime dose she will use once in the past 2 weeks.  She questioned whether she should change and we  recommended continuing on the current regimen for at least a month to get further stability before considering changing the morning dose to as needed as well.  Patient was in agreement with this plan.  She does note that she felt the hospital was helpful but is hopeful to avoid going back.  Since discharge patient has met with a therapist and attended the group program at the [facility] which has also been helpful.  Past Psychiatric History: One  prior psychiatric hospitalization to [facility] in February of 2024 due to increased depression/anxiety. No prior suicide attempts.  Patient did endorse passive SI in November of 2023 without any intent or plan.  She denies being connected with a psychiatrist in the past and had recently connected with a therapist at [facility] therapy.  Has taken Effexor (possible restlessness at 225), Cymbalta, Remeron, Ativan, Xanax, Klonopin, gabapentin, BuSpar, Zyprexa (TD), Latuda (akathisia), Risperdal, Doxepin,   Denies substance use other than 1 beer a day. Can increase to 2 when the weather is nice.  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 LHC 6/16:  oOM1 60, pRCA 25 >> med Rx b. cath 3/19 2nd OM with 95% stenosis s/p synergy DES & anomalous RCA   CAP (community acquired pneumonia) 2016; 06/07/2016   Chronic bronchitis (HCC)    Chronic lower back pain    Complication of anesthesia    "think I have a hard time waking up from it"   COPD (chronic obstructive pulmonary disease) ([place])    Depression    mild   Diverticulitis    Diverticulosis    GERD (gastroesophageal reflux disease)    H/O hiatal hernia    Headache    "weekly" (08/23/2017)   History of echocardiogram    Echo 6/16:  Mod LVH, EF 60-65%, no RWMA, Gr 1 DD, trivial MR, normal LA size.   Hyperglycemia  11/20/2015   Hyperlipidemia, mixed 09/11/2007   Qualifier: Diagnosis of  By: Jerold Coombe   Did not tolerate Lipitor, zocor, Lovastatin, Pravastatin, Livalo, Crestor even low dose    IBS (irritable bowel syndrome)    Maxillary sinusitis    Normal cardiac stress test 11/2011   No evidence of ischemia or infarct.   Calculated ejection fraction 72%.   Obesity    OSA (obstructive sleep apnea) 02/2012   has stopped using  cpap   Osteoarthritis    Osteoporosis    Pneumonia 11/2011   "before 2013 I hadn't had pneumonia since I was a child" (04/13/2012)   Pulmonary nodules    S/P angioplasty with stent 08/23/17 ostial 2nd OM with DES synnergy 08/24/2017   Schatzki's ring     Past Surgical History:  Procedure Laterality Date   [place] N/A 11/25/2014   Procedure: Right/Left Heart Cath  and Coronary Angiography;  Surgeon: Belva Crome, MD;  Location: [place] [facility];  Service: Cardiovascular;  Laterality: N/A;   [place]   COLONOSCOPY  03/2022   CORONARY ANGIOPLASTY WITH STENT PLACEMENT  08/23/2017   CORONARY STENT INTERVENTION N/A 08/23/2017   Procedure: CORONARY STENT INTERVENTION;  Surgeon: Burnell Blanks, MD;  Location: [place] [facility];  Service: Cardiovascular;  Laterality: N/A;   HERNIA REPAIR  04/13/2012   VHR laparoscopic   LEFT HEART CATH AND CORONARY ANGIOGRAPHY N/A 08/23/2017   Procedure: LEFT HEART CATH AND CORONARY ANGIOGRAPHY;  Surgeon: Burnell Blanks, MD;  Location: [place] [facility];  Service: Cardiovascular;  Laterality: N/A;   VENTRAL HERNIA REPAIR  04/13/2012   Procedure: LAPAROSCOPIC VENTRAL HERNIA;  Surgeon: Adin Hector, MD;  Location: [place];  Service: General;  Laterality: N/A;  laparoscopic repair of incarcerated hernia    Family History:  Family History  Problem Relation Age of Onset   Breast cancer Mother    Hypertension Mother    Diabetes Mother    Cancer Mother        recurrent,  metastatic breast cancer.    Diverticulosis Father    Prostate cancer Father    Breast cancer Sister    Cancer Sister        breast cancer, invasive ductal carcinoma in 2022,DCIS at 27 with 4 weeks of radiation, 5 years of Tamoxifen    Pulmonary embolism Brother        recurrent   Heart attack Maternal Grandfather    Cerebral palsy Son    Cancer Niece 65   Osteoporosis Niece    Stroke Neg Hx    Colon cancer Neg Hx    Esophageal cancer Neg Hx    Stomach cancer Neg Hx    Rectal cancer Neg Hx     Social History:  Social History   Socioeconomic History   Marital status: Married    Spouse name: Not on file   Number of children: 1   Years of education: 14   Highest education level: Associate degree: academic program  Occupational History   Occupation: Disabled   Tobacco Use   Smoking status: Never    Passive exposure: Never   Smokeless tobacco: Never  Vaping Use   Vaping Use: Never used  Substance and Sexual Activity   Alcohol use: Yes    Comment: social use- last drink 3 weeks ago   Drug use: No   Sexual activity: Yes    Comment: gluten free, lives with husband and son with CP quadriplegia  Other Topics Concern   Not on file  Social History Narrative   Cares for son with cerebral palsy.    Lives at home with her husband and son.   Right-handed.   2 cups caffeine per day.   One story home   Social Determinants of Health   Financial Resource Strain: Low Risk  (10/19/2021)   Overall Financial Resource Strain (CARDIA)    Difficulty of Paying Living Expenses: Not hard at all  Food Insecurity: No Food Insecurity (08/19/2022)   Hunger Vital Sign    Worried About Running Out of Food in the Last Year: Never true    Ran Out of Food in the Last Year: Never true  Transportation Needs: No Transportation Needs (08/19/2022)   PRAPARE - Hydrologist (Medical): No    Lack of Transportation (Non-Medical): No  Physical Activity: Insufficiently Active  (  10/19/2021)   Exercise Vital Sign    Days of Exercise per Week: 7 days    Minutes of Exercise per Session: 20 min  Stress: Stress Concern Present (10/19/2021)   [place]    Feeling of Stress : Rather much  Social Connections: Moderately Integrated (10/19/2021)   Social Connection and Isolation Panel [NHANES]    Frequency of Communication with Friends and Family: More than three times a week    Frequency of Social Gatherings with Friends and Family: Once a week    Attends Religious Services: More than 4 times per year    Active Member of [employer] or Organizations: No    Attends Archivist Meetings: Never    Marital Status: Married    Allergies:  Allergies  Allergen Reactions   Latuda [Lurasidone Hcl] Other (See Comments)    PER THE PT CAUSED RESTLESSNESS   Beclomethasone Dipropionate Hives and Other (See Comments)     weight gain   Flexeril [Cyclobenzaprine] Anxiety   Mometasone Furo-Formoterol Fum Hives and Other (See Comments)    weight gain   Sulfonamide Derivatives Hives and Rash   Statins Other (See Comments)    Myalgias, RLS    Current Medications: Current Outpatient Medications  Medication Sig Dispense Refill   albuterol (VENTOLIN HFA) 108 (90 Base) MCG/ACT inhaler Inhale 1-2 puffs into the lungs every 6 (six) hours as needed. 8 g 2   aspirin EC 81 MG tablet Take 81 mg by mouth daily. Swallow whole.     Benralizumab (FASENRA PEN) 30 MG/ML SOAJ Inject 1 mL (30 mg total) into the skin every 8 (eight) weeks. 1 mL 2   Bioflavonoid Products (ESTER C PO) Take 500 mg by mouth at bedtime.     budesonide (ENTOCORT EC) 3 MG 24 hr capsule Take 3 caps a day for 4 weeks then take 2 caps daily for 4 weeks then take 1 capsule daily for 4 weeks. (Patient taking differently: Take 3-9 mg by mouth See admin instructions. Take 3 caps a day for 4 weeks then take 2 caps daily for 4 weeks then take 1 capsule daily for 4 weeks  starting on 06/27/22.) 222 capsule 0   Calcium-Magnesium-Vitamin D (CALCIUM MAGNESIUM PO) Take 1 tablet by mouth at bedtime.     clonazePAM (KLONOPIN) 0.5 MG tablet Take 1 tablet (0.5 mg total) by mouth 2 (two) times daily as needed (anxiety). 30 tablet 0   doxepin (SINEQUAN) 25 MG capsule Take 1 capsule (25 mg total) by mouth at bedtime. 30 capsule 3   Evolocumab (REPATHA SURECLICK) [id number] MG/ML SOAJ INJECT 1 PEN INTO THE SKIN EVERY 14 DAYS (Patient taking differently: Inject 140 mg into the skin every 21 ( twenty-one) days.) 6 mL 3   famotidine (PEPCID) 20 MG tablet TAKE 1 TABLET BY MOUTH TWICE A DAY (Patient taking differently: Take 20 mg by mouth daily.) 60 tablet 2   fexofenadine (ALLEGRA) 180 MG tablet Take 180 mg by mouth daily as needed for allergies or rhinitis.     fluticasone (FLONASE) 50 MCG/ACT nasal spray Place 1 spray into both nostrils daily as needed for allergies or rhinitis.     ipratropium-albuterol (DUONEB) 0.5-2.5 (3) MG/3ML SOLN Take 3 mLs by nebulization every 6 (six) hours as needed. (Patient taking differently: Take 3 mLs by nebulization every 6 (six) hours as needed (For shortness of breath or wheezing).) 120 mL 5   nitroGLYCERIN (NITROSTAT) 0.4 MG SL tablet PLACE 1  TABLET UNDER THE TONGUE EVERY 5 MINUTES AS NEEDED FOR CHEST PAIN. (Patient taking differently: 0.4 mg every 5 (five) minutes as needed for chest pain.) 25 tablet 8   pindolol (VISKEN) 5 MG tablet Take 1 tablet (5 mg total) by mouth daily after breakfast. 30 tablet 3   risperiDONE (RISPERDAL) 0.5 MG tablet Take 1 tablet (0.5 mg total) by mouth 2 (two) times daily at 8 am and 4 pm. 60 tablet 3   venlafaxine XR (EFFEXOR XR) 75 MG 24 hr capsule Take 3 capsules (225 mg total) by mouth daily with breakfast. 90 capsule 1   VITAMIN D PO Take 5,000 Units by mouth at bedtime.     No current facility-administered medications for this visit.     Psychiatric Specialty Exam: Review of Systems  There were no vitals taken for  this visit.There is no height or weight on file to calculate BMI.  General Appearance: Well Groomed  Eye Contact:  Good  Speech:  Clear and Coherent and Normal Rate  Volume:  Normal  Mood:  Euthymic  Affect:  Congruent  Thought Process:  Coherent and Goal Directed  Orientation:  Full (Time, Place, and Person)  Thought Content: Logical   Suicidal Thoughts:  No  Homicidal Thoughts:  No  Memory:  NA  Judgement:  Good  Insight:  Fair  Psychomotor Activity:  Normal  Concentration:  Concentration: Good  Recall:  [place] of Knowledge: Fair  Language: Good  Akathisia:  No    AIMS (if indicated): not done  Assets:  Communication Skills Desire for Improvement Housing Intimacy Social Support Transportation Vocational/Educational  ADL's:  Intact  Cognition: WNL  Sleep:  Poor   Metabolic Disorder Labs: Lab Results  Component Value Date   HGBA1C 5.6 05/26/2022   MPG 117 10/22/2008   Lab Results  Component Value Date   PROLACTIN 15.4 08/12/2022   Lab Results  Component Value Date   CHOL 208 (H) 05/26/2022   TRIG 113.0 05/26/2022   HDL 74.00 05/26/2022   CHOLHDL 3 05/26/2022   VLDL 22.6 05/26/2022   LDLCALC 111 (H) 05/26/2022   [place] 95 07/06/2021   Lab Results  Component Value Date   TSH 1.788 08/12/2022   TSH 1.43 05/26/2022    Therapeutic Level Labs: No results found for: "LITHIUM" No results found for: "VALPROATE" No results found for: "CBMZ"   Screenings: [place] Admission (Discharged) from 08/12/2022 in [place] Total Score 2      AUDIT    Flowsheet Row Admission (Discharged) from 08/12/2022 in [place]  Alcohol Use Disorder Identification Test Final Score (AUDIT) 0      GAD-7    Flowsheet Row Office Visit from 06/30/2022 in [facility] Office Visit from 05/26/2022 in [facility] at [place]  Visit from 01/16/2017 in [place] at [facility]  Total GAD-7 Score '11 11 3      '$ [place] Telephone from 08/19/2022 in [place] Visit from 06/30/2022 in [facility] Office Visit from 05/26/2022 in [facility] at [facility] Video Visit from 11/25/2021 in [facility] at [place] from 10/19/2021 in [facility] at [facility]  PHQ-2 Total Score '2 3 3 '$ 0 0  PHQ-9 Total  Score -- '4 4 4 '$ --      Flowsheet Row Admission (Discharged) from 08/12/2022 in [place] Most recent reading at 08/12/2022  9:31 PM ED from 08/12/2022 in [facility] Most recent reading at 08/12/2022  3:47 PM ED from 08/02/2022 in [facility] Most recent reading at 08/02/2022 12:53 PM  C-SSRS RISK CATEGORY Low Risk No Risk Low Risk       Collaboration of Care: Collaboration of Care: Medication Management AEB medication prescription, Psychiatrist AEB chart review, and Other provider involved in patient's care AEB [place] chart review  Patient/Guardian was advised Release of Information must be obtained prior to any record release in order to collaborate their care with an outside provider. Patient/Guardian was advised if they have not already done so to contact the registration department to sign all necessary forms in order for [name] to release information regarding their care.   Consent: Patient/Guardian gives verbal consent for treatment and assignment of benefits for services provided during this visit. Patient/Guardian expressed understanding and agreed to proceed.    Vista Mink, MD 08/26/2022, 8:13 AM   Virtual Visit via Video Note  I connected with Marya Fossa on 08/26/22 at  8:30 AM EST by a video enabled telemedicine  application and verified that I am speaking with the correct person using two identifiers.  Location: Patient: Home Provider: Home Office   I discussed the limitations of evaluation and management by telemedicine and the availability of in person appointments. The patient expressed understanding and agreed to proceed.   I discussed the assessment and treatment plan with the patient. The patient was provided an opportunity to ask questions and all were answered. The patient agreed with the plan and demonstrated an understanding of the instructions.   The patient was advised to call back or seek an in-person evaluation if the symptoms worsen or if the condition fails to improve as anticipated.  I provided 15 minutes of non-face-to-face time during this encounter.   Vista Mink, MD

## 2022-08-26 NOTE — Progress Notes (Signed)
Subjective:    Patient ID: Victoria [name], female    DOB: [birth year], 67 y.o.   MRN: [id number]  This service was provided via telemedicine with audio/visual communication. The patient was located at home The provider was located in provider's GI office. The patient did consent to this telephone visit and is aware of possible charges through their insurance for this visit.   The persons participating in this telemedicine service were the patient and I. Time spent on call: 19 min   HPI Victoria [name] is a 67 year old female with a history of lymphocytic colitis responsive to budesonide, GERD with hiatal hernia, Schatzki's ring with dilation in 2016, colonic diverticulosis with history of diverticulitis, history of pulmonary aspergillosis with eosinophilic asthma and bronchiectasis, anxiety and depression who is seen for follow-up via video visit.  She was last seen on 06/09/2022.  After her last visit with me she was admitted for inpatient psychiatric care for 6 days at [facility].  She is doing much better but had a number of her medications changed.  She was started on a beta-blocker which helps her with internal restlessness.  Her Effexor has been continued and she is also taking risperidone and doxepin.  She will use clonazepam as needed.  Mood is much better.  She feels like she is in a very good place.  She did try to stop the budesonide but the loose stools and diarrhea returned after a couple of weeks.  She is going back on low-dose 3 mg daily and has normal bowel movements.  She also noticed that with the low-dose budesonide her arthritic joint symptoms have resolved.  She is not having abdominal pain.  Not having uncontrolled reflux.  No trouble swallowing.  Her son Victoria [name] is doing well though dealing with some nocturnal coughing associated with tube feeds.  She is moved to [place] to the evening.  He is currently at the [facility] women's basketball tournament with his health aide.   Review of  Systems As per HPI, otherwise negative  Current Medications, Allergies, Past Medical History, Past Surgical History, Family History and Social History were reviewed in [employer] record.    Objective:   Physical Exam BP 110/82 Comment: pt stated  Pulse 80 Comment: pt stated  Ht '5\' 1"'$  (1.549 m) Comment: from previous in chart  Wt 153 lb (69.4 kg) Comment: from previous in chart  BMI 28.91 kg/m  Gen: awake, alert, NAD, well-appearing by video visit HEENT: anicteric       Assessment & Plan:  67 year old female with a history of lymphocytic colitis responsive to budesonide, GERD with hiatal hernia, Schatzki's ring with dilation in 2016, colonic diverticulosis with history of diverticulitis, history of pulmonary aspergillosis with eosinophilic asthma and bronchiectasis, anxiety and depression who is seen for follow-up via video visit.  1.  Lymphocytic colitis --return of symptoms once budesonide was withdrawn.  She has complete remission on low-dose budesonide 3 mg daily.  Will continue this for 6 more months and then revisit in clinic and try to taper this off.  Could consider mesalamine with a goal of removing steroids completely at follow-up depending on response. -- Continue budesonide 3 mg daily x 6 months and follow-up via virtual visit or in person visit at that time  2.  GERD --currently controlled on low-dose famotidine 20 mg once daily.  Continue for now.  PPI withdrawn given lymphocytic colitis.  3.  Anxiety and depression --currently under control with current therapy.  She follows  closely with primary care and psychiatry.  4.  Colon cancer screening --no polyps at last colonoscopy, consider repeat colonoscopy in October 2033  54-monthfollow-up  30 minutes total spent today including patient facing time, coordination of care, reviewing medical history/procedures/pertinent radiology studies, and documentation of the encounter.

## 2022-08-26 NOTE — Addendum Note (Signed)
Addended by: Larina Bras on: 08/26/2022 02:55 PM   Modules accepted: Orders

## 2022-09-04 NOTE — Assessment & Plan Note (Deleted)
Follows with pulmonology, no recent exacerbation

## 2022-09-04 NOTE — Assessment & Plan Note (Deleted)
Encourage heart healthy diet such as MIND or DASH diet, increase exercise, avoid trans fats, simple carbohydrates and processed foods, consider a krill or fish or flaxseed oil cap daily.  °

## 2022-09-04 NOTE — Assessment & Plan Note (Deleted)
Patient is established with behavioral health/psychiatry after being hospitalized with suicidal ideation

## 2022-09-04 NOTE — Assessment & Plan Note (Deleted)
hgba1c acceptable, minimize simple carbs. Increase exercise as tolerated.  

## 2022-09-05 ENCOUNTER — Ambulatory Visit: Payer: PPO | Admitting: Family Medicine

## 2022-09-05 ENCOUNTER — Ambulatory Visit (INDEPENDENT_AMBULATORY_CARE_PROVIDER_SITE_OTHER): Payer: PPO | Admitting: Family Medicine

## 2022-09-05 ENCOUNTER — Encounter: Payer: Self-pay | Admitting: Family Medicine

## 2022-09-05 VITALS — BP 108/64 | Ht 61.0 in | Wt 153.0 lb

## 2022-09-05 DIAGNOSIS — R739 Hyperglycemia, unspecified: Secondary | ICD-10-CM

## 2022-09-05 DIAGNOSIS — M5416 Radiculopathy, lumbar region: Secondary | ICD-10-CM | POA: Diagnosis not present

## 2022-09-05 DIAGNOSIS — F419 Anxiety disorder, unspecified: Secondary | ICD-10-CM

## 2022-09-05 DIAGNOSIS — J4541 Moderate persistent asthma with (acute) exacerbation: Secondary | ICD-10-CM

## 2022-09-05 DIAGNOSIS — E782 Mixed hyperlipidemia: Secondary | ICD-10-CM

## 2022-09-05 NOTE — Assessment & Plan Note (Signed)
Acute on chronic in nature.  Having exacerbation of her radiculopathy.  Did well with the previous epidural. -Counseled on home exercise therapy and supportive care. -Pursue epidural

## 2022-09-05 NOTE — Progress Notes (Signed)
Victoria [name] - 67 y.o. female MRN [id number]  Date of birth: [birth year]  SUBJECTIVE:  Including CC & ROS.  No chief complaint on file.   Victoria [name] is a 67 y.o. female that is presenting with worsening of her left-sided radicular pain.  She has done well since the previous injection.  The pain is started to where she cannot walk more than a mile and having severe pain on the left leg.  This is similar to her previous pain.   Review of Systems See HPI   HISTORY: Past Medical, Surgical, Social, and Family History Reviewed & Updated per EMR.   Pertinent Historical Findings include:  Past Medical History:  Diagnosis Date   Allergic bronchopulmonary aspergillosis ([place]) 2008   sees Dr Edmund Hilda pulmonology   Anemia    iron deficiency, resolved   Anxiety    Asthma    CAD (coronary artery disease)    a. LHC 6/16:  oOM1 60, pRCA 25 >> med Rx b. cath 3/19 2nd OM with 95% stenosis s/p synergy DES & anomalous RCA   CAP (community acquired pneumonia) 2016; 06/07/2016   Chronic bronchitis ([facility])    Chronic lower back pain    Complication of anesthesia    "think I have a hard time waking up from it"   COPD (chronic obstructive pulmonary disease) ([place])    Depression    mild   Diverticulitis    Diverticulosis    GERD (gastroesophageal reflux disease)    H/O hiatal hernia    Headache    "weekly" (08/23/2017)   History of echocardiogram    Echo 6/16:  Mod LVH, EF 60-65%, no RWMA, Gr 1 DD, trivial MR, normal LA size.   Hyperglycemia 11/20/2015   Hyperlipidemia, mixed 09/11/2007   Qualifier: Diagnosis of  By: Jerold Coombe   Did not tolerate Lipitor, zocor, Lovastatin, Pravastatin, Livalo, Crestor even low dose    IBS (irritable bowel syndrome)    Maxillary sinusitis    Normal cardiac stress test 11/2011   No evidence of ischemia or infarct.   Calculated ejection fraction 72%.   Obesity    OSA (obstructive sleep apnea) 02/2012   has stopped using  cpap   Osteoarthritis     Osteoporosis    Pneumonia 11/2011   "before 2013 I hadn't had pneumonia since I was a child" (04/13/2012)   Pulmonary nodules    S/P angioplasty with stent 08/23/17 ostial 2nd OM with DES synnergy 08/24/2017   Schatzki's ring     Past Surgical History:  Procedure Laterality Date   APPENDECTOMY  1989   CARDIAC CATHETERIZATION N/A 11/25/2014   Procedure: Right/Left Heart Cath and Coronary Angiography;  Surgeon: Belva Crome, MD;  Location: [facility];  Service: Cardiovascular;  Laterality: N/A;   [place]   COLONOSCOPY  03/2022   CORONARY ANGIOPLASTY WITH STENT PLACEMENT  08/23/2017   CORONARY STENT INTERVENTION N/A 08/23/2017   Procedure: CORONARY STENT INTERVENTION;  Surgeon: Burnell Blanks, MD;  Location: [facility];  Service: Cardiovascular;  Laterality: N/A;   HERNIA REPAIR  04/13/2012   VHR laparoscopic   LEFT HEART CATH AND CORONARY ANGIOGRAPHY N/A 08/23/2017   Procedure: LEFT HEART CATH AND CORONARY ANGIOGRAPHY;  Surgeon: Burnell Blanks, MD;  Location: [facility];  Service: Cardiovascular;  Laterality: N/A;   VENTRAL HERNIA REPAIR  04/13/2012   Procedure: LAPAROSCOPIC VENTRAL HERNIA;  Surgeon: Adin Hector, MD;  Location: [facility];  Service: General;  Laterality: N/A;  laparoscopic repair of incarcerated hernia     PHYSICAL EXAM:  VS: BP 108/64 (BP Location: Left Arm, Patient Position: Sitting)   Ht 5\' 1"  (1.549 m)   Wt 153 lb (69.4 kg)   BMI 28.91 kg/m  Physical Exam Gen: NAD, alert, cooperative with exam, well-appearing MSK:  Neurovascularly intact       ASSESSMENT & PLAN:   Lumbar radiculopathy Acute on chronic in nature.  Having exacerbation of her radiculopathy.  Did well with the previous epidural. -Counseled on home exercise therapy and supportive care. -Pursue epidural

## 2022-09-05 NOTE — Patient Instructions (Signed)
Good to see you Please use heat as needed  We have placed the order for the epidural   Please send me a message in MyChart with any questions or updates.  Please see me back as needed.   --Dr. Raeford Razor

## 2022-09-06 DIAGNOSIS — N819 Female genital prolapse, unspecified: Secondary | ICD-10-CM | POA: Diagnosis not present

## 2022-09-09 ENCOUNTER — Telehealth: Payer: Self-pay | Admitting: *Deleted

## 2022-09-09 NOTE — Telephone Encounter (Signed)
Requesting office sent over another request with a new date for surgery. New date is 10/18/22. I will resend back to pre op pool.

## 2022-09-09 NOTE — Telephone Encounter (Signed)
Pt has been scheduled for a tele pre op appt 09/20/22 @ 9:40. Med rec and consent are done

## 2022-09-09 NOTE — Telephone Encounter (Signed)
   Name: Victoria [name]  DOB: 10-Feb-1956  MRN: [id number]  Primary Cardiologist: Ena Dawley, MD  Chart reviewed as part of pre-operative protocol coverage. Because of [name]'s past medical history and time since last visit, she will require a follow-up telephone visit in order to better assess preoperative cardiovascular risk.  Pre-op covering staff: - Please schedule appointment and call patient to inform them. If patient already had an upcoming appointment within acceptable timeframe, please add "pre-op clearance" to the appointment notes so provider is aware. - Please contact requesting surgeon's office via preferred method (i.e, phone, fax) to inform them of need for appointment prior to surgery.  May hold ASA if needed.  Elgie Collard, PA-C  09/09/2022, 1:16 PM

## 2022-09-09 NOTE — Telephone Encounter (Signed)
Pt has been scheduled for a tele pre op appt 09/20/22 @ 9:40. Med rec and consent are done.     Patient Consent for Virtual Visit        Victoria [name] has provided verbal consent on 09/09/2022 for a virtual visit (video or telephone).   CONSENT FOR VIRTUAL VISIT FOR:  Victoria [name]  By participating in this virtual visit I agree to the following:  I hereby voluntarily request, consent and authorize [facility] and its employed or contracted physicians, physician assistants, nurse practitioners or other licensed health care professionals (the Practitioner), to provide me with telemedicine health care services (the "Services") as deemed necessary by the treating Practitioner. I acknowledge and consent to receive the Services by the Practitioner via telemedicine. I understand that the telemedicine visit will involve communicating with the Practitioner through live audiovisual communication technology and the disclosure of certain medical information by electronic transmission. I acknowledge that I have been given the opportunity to request an in-person assessment or other available alternative prior to the telemedicine visit and am voluntarily participating in the telemedicine visit.  I understand that I have the right to withhold or withdraw my consent to the use of telemedicine in the course of my care at any time, without affecting my right to future care or treatment, and that the Practitioner or I may terminate the telemedicine visit at any time. I understand that I have the right to inspect all information obtained and/or recorded in the course of the telemedicine visit and may receive copies of available information for a reasonable fee.  I understand that some of the potential risks of receiving the Services via telemedicine include:  Delay or interruption in medical evaluation due to technological equipment failure or disruption; Information transmitted may not be sufficient (e.g. poor  resolution of images) to allow for appropriate medical decision making by the Practitioner; and/or  In rare instances, security protocols could fail, causing a breach of personal health information.  Furthermore, I acknowledge that it is my responsibility to provide information about my medical history, conditions and care that is complete and accurate to the best of my ability. I acknowledge that Practitioner's advice, recommendations, and/or decision may be based on factors not within their control, such as incomplete or inaccurate data provided by me or distortions of diagnostic images or specimens that may result from electronic transmissions. I understand that the practice of medicine is not an exact science and that Practitioner makes no warranties or guarantees regarding treatment outcomes. I acknowledge that a copy of this consent can be made available to me via my patient portal ([facility]), or I can request a printed copy by calling the office of [facility].    I understand that my insurance will be billed for this visit.   I have read or had this consent read to me. I understand the contents of this consent, which adequately explains the benefits and risks of the Services being provided via telemedicine.  I have been provided ample opportunity to ask questions regarding this consent and the Services and have had my questions answered to my satisfaction. I give my informed consent for the services to be provided through the use of telemedicine in my medical care

## 2022-09-12 ENCOUNTER — Ambulatory Visit
Admission: RE | Admit: 2022-09-12 | Discharge: 2022-09-12 | Disposition: A | Discharge status: Home / Self Care | Payer: PPO | Source: Ambulatory Visit | Attending: Obstetrics and Gynecology | Admitting: Obstetrics and Gynecology

## 2022-09-12 DIAGNOSIS — R898 Other abnormal findings in specimens from other organs, systems and tissues: Secondary | ICD-10-CM

## 2022-09-12 DIAGNOSIS — N6323 Unspecified lump in the left breast, lower outer quadrant: Secondary | ICD-10-CM | POA: Diagnosis not present

## 2022-09-12 MED ORDER — GADOPICLENOL 0.5 MMOL/ML IV SOLN
7.0000 mL | Freq: Once | INTRAVENOUS | Status: AC | PRN
  Administered 2022-09-12: 7 mL via INTRAVENOUS

## 2022-09-13 ENCOUNTER — Other Ambulatory Visit: Payer: Self-pay | Admitting: Obstetrics and Gynecology

## 2022-09-13 DIAGNOSIS — R9389 Abnormal findings on diagnostic imaging of other specified body structures: Secondary | ICD-10-CM

## 2022-09-14 ENCOUNTER — Ambulatory Visit
Admission: RE | Admit: 2022-09-14 | Discharge: 2022-09-14 | Disposition: A | Discharge status: Home / Self Care | Payer: PPO | Source: Ambulatory Visit | Attending: Family Medicine | Admitting: Family Medicine

## 2022-09-14 DIAGNOSIS — M5416 Radiculopathy, lumbar region: Secondary | ICD-10-CM

## 2022-09-14 DIAGNOSIS — M545 Low back pain, unspecified: Secondary | ICD-10-CM | POA: Diagnosis not present

## 2022-09-14 DIAGNOSIS — M546 Pain in thoracic spine: Secondary | ICD-10-CM | POA: Diagnosis not present

## 2022-09-14 MED ORDER — METHYLPREDNISOLONE ACETATE 40 MG/ML INJ SUSP (RADIOLOG
80.0000 mg | Freq: Once | INTRAMUSCULAR | Status: AC
  Administered 2022-09-14: 80 mg via EPIDURAL

## 2022-09-14 MED ORDER — IOPAMIDOL (ISOVUE-M 200) INJECTION 41%
1.0000 mL | Freq: Once | INTRAMUSCULAR | Status: AC
  Administered 2022-09-14: 1 mL via EPIDURAL

## 2022-09-14 NOTE — Discharge Instructions (Signed)

## 2022-09-15 ENCOUNTER — Ambulatory Visit: Payer: PPO | Admitting: Internal Medicine

## 2022-09-15 ENCOUNTER — Encounter: Payer: Self-pay | Admitting: Internal Medicine

## 2022-09-15 VITALS — BP 104/60 | HR 80 | Temp 98.1°F | Ht 61.0 in | Wt 156.6 lb

## 2022-09-15 DIAGNOSIS — J4550 Severe persistent asthma, uncomplicated: Secondary | ICD-10-CM | POA: Diagnosis not present

## 2022-09-15 DIAGNOSIS — B4481 Allergic bronchopulmonary aspergillosis: Secondary | ICD-10-CM | POA: Diagnosis not present

## 2022-09-15 DIAGNOSIS — K449 Diaphragmatic hernia without obstruction or gangrene: Secondary | ICD-10-CM | POA: Diagnosis not present

## 2022-09-15 DIAGNOSIS — Z01811 Encounter for preprocedural respiratory examination: Secondary | ICD-10-CM | POA: Diagnosis not present

## 2022-09-15 NOTE — Progress Notes (Signed)
OV 10/06/2016  Chief Complaint  Patient presents with   Follow-up    Pt states her asthma is good; had one flare up since last visit. SOB with exertion, cough with yellow to brown mucus prodcution, with frequent wheezing. Notice improvement with being on [employer]      67 year old female with severe asthma with ABPA and elevated IgE and eosinophilia. She is on IL5RAb antibody treatment now for the last few months. She feels this is helping her significantly. Her symptoms course of better. She is on Asmanex, Spiriva, Singulair as well. She is not on daily prednisone anymore. In December 2017 should she did grow MRSA from sputum culture. Currently a few days ago she developed sore throat and having some sputum production. She is worried this might be MRSA. Also asthma itself is stable and well controlled according to history. She is wondering about getting a sputum culture and really wishes for it    OV 01/11/2017  Chief Complaint  Patient presents with   Follow-up    Pt states her breathing is doing well. Pt states the last couple days she has had more wheezing. Pt states she has noticed an improvement being on Cinqair.    67 year old female with severe asthma with ABPA and elevated IgE and eosinophilia. She is on IL5RAb antibody treatment.  She is on Asmanex, Spiriva, Singulair as well. She is not on daily prednisone anymore   01/11/2017 - Here for routine follow-up. She is on interleukin-5 receptor antibody IV infusion therapy. This was held a few weeks ago because of asthma exacerbation. She says approximately on Father's Day 2018 he started feeling unwell and then ended up with an exacerbation for which she needed antibiotics and prednisone. She is surprised by this decompensation despite being on this biologic immunotherapy for asthma. She is compliant with her Asmanex, Spiriva and Singulair. She is not on prednisone. Currently she feels baseline. Patient infusion #6 or 7 is due for  tomorrow [interleukin-5 receptor antibody). Overall she feels improved quality of life. Despite all this exhaled nitric oxide continues to be HIGH; unclear why   OV 04/21/2017  Chief Complaint  Patient presents with   Follow-up    Pt states that she has been doing good. Has had no flare-ups with her asthma. Mild coughing at night. Denies any SOB or CP.  severe asthma with ABPA and elevated IgE and eosinophilia. She is on IL5RAb antibody treatment.  She is on Asmanex, Spiriva, Singulair as well. She is not on daily prednisone anymore  Continues cinqair spiriva, singulair, and asmanex. At this point in time the interleukin-5 receptor antibody is working really well for her. For nitric oxide today is lowest ever.. In terms of his symptoms she hardly ever gets up at night. When she wakes up she does not have any symptoms. She only has slight limitation with physical activity. She's not shortness of breath because of asthma not wheezing and not using albuterol for rescue use. The asthma control score is 0.8. However she is extremely frustrated by the [facility] gravida cycle management of her interleukin-5 receptor antibody infusions. She is getting different bills wrong bills missed bills. Therefore she wants to switch to subcutaneous injectable therapy. She is very upset about the quality of care that she has received at the infusion center because of billing practices.  OV 10/19/2017  Chief Complaint  Patient presents with   Follow-up    PFT done today. Pt states she has  been doing well. Has had a little more coughing and wheezing recently and thinks it could be due to the weather. Pt just got started back on Fasenra injections.   Severe persistent asthma with ABPA elevated IgE and eosinophilia. S/P cinqair interleukin-5 receptor antibody treatment ending" 2018 with good response but stopped secondary to billing errors at [facility] infusion center. Based on medical need then switched  to anti-eosinophil interleukin-5 receptor antibody subcutaneous injection treatment Fasenra early 2019. She is also on baseline Asmanex, Spiriva, Singulair and antihistamine 8. She is not on daily prednisone anymore.  Last seen November 2018. Overall she was doing well with good effort tolerance but in March 2019 had some atypical chest painand ended up with a coronary artery stent in the abuse marginal. She is improved from that. She is wondering about taking a beta blocker safely. She does not feel that his anemia because of blood pressure is low but apparently cardiology is asked her to clear this with me. In the interim her IV injection interleukin-5 receptor antibody has been changed to subcutaneous injection FASENRA . Because of her cardiac issues she's only taken 3 doses since January 2019. She hopes to go back on schedule with this. She had billing issues at the [facility] infusion center with the IV infusion and that is why we switched to the subcutaneous injection. However, it looks like the workflow authorization for this was not done correctly and she isn't of the large co-pay. She is unhappy about this and routinely discussed this with the patient. In terms of asthma she feels her cough is decompensated in the last few weeks and this correlates with her not taking her Asmanex. Her lung function test is near normal today. Her exhaled nitric oxide is slightly elevated than the lowest that she's had but still better than her baseline average. Currently she is waking up at nightcoughing because of the spring pollen but only hardly. Very mild symptoms when she wakes up she is slightly limited in the daytime and she experiences shortness of breath little of the time not using albuterol for rescue.    Results for RIHANNA, MARSEILLE (MRN [id number]) as of 10/19/2017 11:51  Ref. Range 10/19/2017 10:47  FEV1-Post Latest Units: L 1.80  FEV1-%Pred-Post Latest Units: % 76  FEV1-%Change-Post Latest Units: % 0   Results for NEKEISHA, AURE (MRN [id number]) as of 10/19/2017 11:51  Ref. Range 10/19/2017 10:47  TLC Latest Units: L 4.90  TLC % pred Latest Units: % 103  Results for LAREN, ORAMA (MRN [id number]) as of 10/19/2017 11:51  Ref. Range 10/19/2017 10:47  DLCO unc Latest Units: ml/min/mmHg 19.51  DLCO unc % pred Latest Units: % 90    OV 05/14/2018  Subjective:  Patient ID: Victoria [name], female , DOB: 02-21-1956 , age 65 y.o. , MRN: [id number] , ADDRESS: [place] [id number]   05/14/2018 -   Chief Complaint  Patient presents with   Follow-up    breathing good, SOB on exertion   Severe persistent asthma with ABPA elevated IgE and eosinophilia. S/P cinqair interleukin-5 receptor antibody treatment ending" 2018 with good response but stopped secondary to billing errors at [facility] infusion center. Based on medical need then switched to anti-eosinophil interleukin-5 receptor antibody subcutaneous injection treatment Fasenra early 2019. She is also on baseline Asmanex, Spiriva, Singulair and antihistamine 8. She is not on daily prednisone anymore.  Known hiatal hernia  HPI TRYNITY SKOUSEN 67 y.o. -returns for follow-up  of severe persistent asthma.  Earlier this year we switched her to interleukin-5 receptor antibody subcutaneous injection may be AstraZeneca called Fasenra.  With this she feels her asthma symptoms have improved dramatically.  She feels it is better than seeing care.  She still thankful to the scientist of developed this biologic.  Her exam nitric oxide is the lowest ever at 27.  She is not having any nighttime symptoms or any wheezing.  In fact she stopped her inhaled steroid partly because she was feeling well but partly also because she is in the donut hole.  However she is continuing her Spiriva and Singulair.  She is up-to-date with her flu shot.    She is dealing with skin infections.  Primary care physician notes reviewed.  There is question whether this is  being precipitated by the fact she is on to Biologics.  She thinks she had MRSA but when I reviewed the chart on May 08, 2018 shows MSSA.  Therefore she is been started on Augmentin today which she is yet to start.  She also has hiatal hernia.  She has not seen GI in a few years.  It is fairly well-controlled and she plans to see GI soon.  OV 02/14/2019  Subjective:  Patient ID: Victoria [name], female , DOB: September 22, 1955 , age 22 y.o. , MRN: [id number] , ADDRESS: [place] [address]   02/14/2019 -   Chief Complaint  Patient presents with   Severe persistent asthma with intensive monitoring   Severe persistent asthma with ABPA elevated IgE and eosinophilia. S/P cinqair interleukin-5 receptor antibody treatment ending" 2018 with good response but stopped secondary to billing errors at [facility] infusion center. Based on medical need then switched to anti-eosinophil interleukin-5 receptor antibody subcutaneous injection treatment Fasenra early 2019. She is  NOT on baseline Asmanex, Spiriva, Singulair and antihistamine 8. She is not on daily prednisone anymore.  Known hiatal hernia  HPI CORENE RESNICK 67 y.o. -is here for follow-up of asthma with ABPA.  Since her last visit she is only on biologic Fasenra.  She has stopped Singulair, Spiriva, Asmanex.  She is not taking daily prednisone.  Asthma is well controlled.  A few weeks ago she did have a itchy feeling intake epinephrine.  She also had wheeze at this time.  This helped control the situation.  Other than that she is doing well asthma is extremely well controlled.  No nocturnal awakenings no albuterol rescue use.  We are unable to do exhaled nitric oxide testing today because of the pandemic.  She told me that she wants to remove budesonide out of her allergy list because she is only allergic to [facility] and not the nebulizer.  She is doing social distancing.  She had an appointment earlier in the year with GI because of her  hiatal hernia but because of the pandemic she did not follow through.  She is open to having this appointment and referral made again.    OV 05/04/2020  Subjective:  Patient ID: Victoria [name], female , DOB: 1955-09-13 , age 87 y.o. , MRN: [id number] , ADDRESS: [place] [address] PCP Mosie Lukes, MD Patient Care Team: Mosie Lukes, MD as PCP - General (Family Medicine) Dorothy Spark, MD as PCP - Cardiology (Cardiology) Haverstock, Jennefer Bravo, MD as Referring Physician (Dermatology) Brand Males, MD as Consulting Physician (Pulmonary Disease) Dorothy Spark, MD as Consulting Physician (Cardiology) Dian Queen, MD as Consulting  Physician (Obstetrics and Gynecology) Melissa Montane, MD as Consulting Physician (Otolaryngology) Pyrtle, Lajuan Lines, MD as Consulting Physician (Gastroenterology) Pieter Partridge, DO as Consulting Physician (Neurology)  This Provider for this visit: Treatment Team:  Attending Provider: Brand Males, MD    05/04/2020 -   Chief Complaint  Patient presents with   Follow-up    Pt c/o cough since 1 day ago- prod with yellow sputum. She has also been wheezing some. She uses her duoneb 4-6 x per wk.      HPI ILY DENNO 67 y.o. -presents for follow-up of a complicated asthma with ABPA.  She has been overall doing well with [name].  However in September 2021 after being exposed to someone with Covid she had respiratory exacerbation but she was ruled out for Covid.  She took 5 days of prednisone and did well but she had a cough that lingered through October 2021.  Then she developed chest pains.  We did a CT angiogram that ruled out PE.  I was involved in the decision making.  It only showed hiatal hernia.  I personally visualized that CT again.  She can got worried that the [name] is not working because of that one respiratory exacerbation and the lingering cough.  In addition she tells me that yesterday while eating  some crackers she choked and today she is wheezing.  She is noted to be on alendronate for the last 2 years.  This is by primary care physician.  In the spring 2020 when she did see GI Dr. Hilarie Fredrickson and interventional endoscopy has been recommended but she is so far holding off on that.  Her acid reflux regimen has been escalated.  She is up-to-date with her vaccine but is inquiring about Covid booster timing.   Asthma Control Test ACT Total Score  05/04/2020 19     Lab Results  Component Value Date   NITRICOXIDE 42 04/21/2017     OV 06/04/2021  Subjective:  Patient ID: Victoria [name], female , DOB: 26-Jul-1955 , age 29 y.o. , MRN: [id number] , ADDRESS: [address] [place] [id number] PCP Mosie Lukes, MD Patient Care Team: Mosie Lukes, MD as PCP - General (Family Medicine) Dorothy Spark, MD (Inactive) as PCP - Cardiology (Cardiology) Haverstock, Jennefer Bravo, MD as Referring Physician (Dermatology) Brand Males, MD as Consulting Physician (Pulmonary Disease) Dorothy Spark, MD (Inactive) as Consulting Physician (Cardiology) Dian Queen, MD as Consulting Physician (Obstetrics and Gynecology) Melissa Montane, MD as Consulting Physician (Otolaryngology) Pyrtle, Lajuan Lines, MD as Consulting Physician (Gastroenterology) Pieter Partridge, DO as Consulting Physician (Neurology)  This Provider for this visit: Treatment Team:  Attending Provider: Brand Males, MD    06/04/2021 -   Chief Complaint  Patient presents with   Follow-up    Pt states she has been doing okay since last visit. States she has had sinus pressure and also some pain in sinuses.    Severe persistent asthma with ABPA elevated IgE and eosinophilia. S/P cinqair interleukin-5 receptor antibody treatment ending" 2018 with good response but stopped secondary to billing errors at [facility] infusion center. Based on medical need then switched to anti-eosinophil interleukin-5 receptor antibody  subcutaneous injection treatment Fasenra early 2019. She is  NOT on baseline Asmanex, Spiriva, Singulair and antihistamine 8. She is not on daily prednisone anymore.  Known hiatal hernia  HPI KARRINE KLUTTZ 67 y.o. -returns for follow-up.  Is a routine follow-up.  At this point in time she  is on biologic Fasenra.  She is not taking any of her inhalers or Singulair.  She did see Tammy.  In the summer 2022 had an exacerbation had to go on prednisone.  Personally saw her a year ago.  Currently asthma is well controlled.  She says she has intermittent wheezing and is wondering if she should be on a maintenance inhaler.  She asked me about evidence in literature about monotherapy with [name].  Explained to her that Biologics was studied as add-on therapy and she needs to be on a maintenance therapy.  She is agreeable to go back on her Asmanex.  This because she does have some occasional wheezing. ACT score shows good control  Of note she is having chronic headaches.  She has been on [place] since 2019.  Prior to that she was started on Repatha.  She is also on Prolia These can cause headaches.    She had CT angio which shows stable hiatale hernia.   Asthma Control Test ACT Total Score  06/04/2021 23  03/17/2021 16  05/04/2020 19    Lab Results  Component Value Date   NITRICOXIDE 42 04/21/2017     CT Chest data  No results found.      OV 09/06/2021  Subjective:  Patient ID: Victoria [name], female , DOB: April 19, 1956 , age 27 y.o. , MRN: [id number] , ADDRESS: [address] [place] [id number] PCP Mosie Lukes, MD Patient Care Team: Mosie Lukes, MD as PCP - General (Family Medicine) Dorothy Spark, MD as PCP - Cardiology (Cardiology) Haverstock, Jennefer Bravo, MD as Referring Physician (Dermatology) Brand Males, MD as Consulting Physician (Pulmonary Disease) Dorothy Spark, MD as Consulting Physician (Cardiology) Dian Queen, MD as Consulting  Physician (Obstetrics and Gynecology) Melissa Montane, MD as Consulting Physician (Otolaryngology) Pyrtle, Lajuan Lines, MD as Consulting Physician (Gastroenterology) Pieter Partridge, DO as Consulting Physician (Neurology)  This Provider for this visit: Treatment Team:  Attending Provider: Brand Males, MD    09/06/2021 -   Chief Complaint  Patient presents with   Follow-up    Pt denies any flare ups with her asthma since last visit. States that she does use her rescue inhaler when she needs it. States that she is still having pain in her sternum and ribs and in back which she has had since last visit.     Severe persistent asthma with ABPA elevated IgE and eosinophilia. S/P cinqair interleukin-5 receptor antibody treatment ending" 2018 with good response but stopped secondary to billing errors at [facility] infusion center. Based on medical need then switched to anti-eosinophil interleukin-5 receptor antibody subcutaneous injection treatment Fasenra early 2019. She is  NOT on baseline Asmanex, Spiriva, Singulair and antihistamine 8. She is not on daily prednisone anymore.  Known hiatal hernia   HPI KENDAHL BUMGARDNER 67 y.o. -returns for her 5-monthfollow-up.  Asthma itself is well controlled.  However she is dealing with significant spinal issues.  She follows with Dr. HJeannine Kittenat a clinic in [place].  She tells me that her scoliosis is worse and the kyphosis is worse.  She is lost 1.25 inches in height and the kyphosis is worse by 13 degrees.  Associated with that she is having persistent left rib cage pain and also sternal pain.  She states at 1 point on 08/15/2021 the pain was so bad she went to the ER and called EMS.  She has been treated with meloxicam.  She also saw Dr. SRosiland Ozand's  sports medicine is being given left hip injection.  She is attended massage therapy on 08/27/2021 and periodically is used walker.  Currently somewhat better.  She has had CT scans.  She also had ANA that was positive on  09/03/2021.  The results available between 1: 80 and 1: 320.  Apparently rheumatology referral is in place.  Asthma and hiatal hernia itself under control.  She is quite bothered by the pain.  This is despite physical therapy.  She is frustrated by this.    (H): Data is abnormally highh  OV 03/07/2022  Subjective:  Patient ID: Victoria [name], female , DOB: [birth year] , age 26 y.o. , MRN: [id number] , ADDRESS: [address] [place] [id number] PCP Mosie Lukes, MD Patient Care Team: Mosie Lukes, MD as PCP - General (Family Medicine) Dorothy Spark, MD as PCP - Cardiology (Cardiology) Haverstock, Jennefer Bravo, MD as Referring Physician (Dermatology) Brand Males, MD as Consulting Physician (Pulmonary Disease) Dorothy Spark, MD as Consulting Physician (Cardiology) Dian Queen, MD as Consulting Physician (Obstetrics and Gynecology) Melissa Montane, MD as Consulting Physician (Otolaryngology) Pyrtle, Lajuan Lines, MD as Consulting Physician (Gastroenterology) Pieter Partridge, DO as Consulting Physician (Neurology)  This Provider for this visit: Treatment Team:  Attending Provider: Brand Males, MD    03/07/2022 -   Chief Complaint  Patient presents with   Follow-up    Pt states she had covid about 3 weeks ago and states she still has a cough that she cannot get rid of.      HPI BRIEONNA SENNETT 67 y.o. -returns for follow-up.  She continues on [place].  Last visit I reintroduced inhaled corticosteroid Asmanex but for some reason she is not on it.  We went over the rationale for Biologics which is failed inhaler therapy.  Did express concern that if she is not on inhaler therapy that her [name] could be denied in the future.  She is going to restart Asmanex.  Currently she does not have any acid reflux symptoms.  Her back pain is better after she got new orthotics that increases her shoe height.  The only issue is that 3 weeks ago she had COVID-19.  She did take  antiviral and then she got better.  She did have some residual cough but primary care physician has given her some Hydromet.  She says the cough is working its way out.  There are no other new issues.  She will have a flu shot today.   OV 09/15/2022  Subjective:  Patient ID: Victoria [name], female , DOB: [birth year] , age 48 y.o. , MRN: [id number] , ADDRESS: [address] [place] [id number] PCP Mosie Lukes, MD Patient Care Team: Mosie Lukes, MD as PCP - General (Family Medicine) Dorothy Spark, MD as PCP - Cardiology (Cardiology) Haverstock, Jennefer Bravo, MD as Referring Physician (Dermatology) Brand Males, MD as Consulting Physician (Pulmonary Disease) Dorothy Spark, MD as Consulting Physician (Cardiology) Dian Queen, MD as Consulting Physician (Obstetrics and Gynecology) Melissa Montane, MD as Consulting Physician (Otolaryngology) Pyrtle, Lajuan Lines, MD as Consulting Physician (Gastroenterology) Pieter Partridge, DO as Consulting Physician (Neurology)  This Provider for this visit: Treatment Team:  Attending Provider: Brand Males, MD   Severe persistent asthma with ABPA elevated IgE and eosinophilia. S/P cinqair interleukin-5 receptor antibody treatment ending" 2018 with good response but stopped secondary to billing errors at [facility] infusion center. Based on medical need then switched to anti-eosinophil interleukin-5  receptor antibody subcutaneous injection treatment Fasenra early 2019. She is  NOT on baseline Asmanex, Spiriva, Singulair and antihistamine 8. She is not on daily prednisone anymore.  -March 2024 medications include Renelda Loma, Flonase, DuoNeb  Known hiatal hernia  ANA positive - saw Dr Estanislado Pandy Sept 2023 and reassured  09/15/2022 -   Chief Complaint  Patient presents with   Follow-up    Follow up. "A lot going on"      HPI ARLYNE EHLER 67 y.o. -returns for follow-up.  Last seen in September 2023.  Is a routine asthma  follow-up.  From an asthma perspective she is doing well.  She continues to [place].  She is yet to restart her inhaled steroid Asmanex and she wants a refill for that.  The new issues that she is having surgery for hysterectomy and bilateral salpingo-oophorectomy because of bladder prolapse.  We did a evaluation and I deem her to be low moderate risk for pulmonary complications as long as there is no asthma flareup.  The other interim issues that she had major depression and anxiety and was hospitalized and [facility] for 3 days towards the end of February 2024.  She is also undergoing breast biopsy next week.  All this is naturally upsetting for her but she did indicate that asthma itself is stable.    Asthma Control Test ACT Total Score  03/07/2022 11:27 AM 14  09/06/2021  1:28 PM 22  06/04/2021 10:35 AM 23     Lab Results  Component Value Date   NITRICOXIDE 42 04/21/2017       Results for CADYN, TEBEAU (MRN [id number]) as of 05/14/2018 10:52  Ref. Range 05/05/2015 13:45 07/17/2015 12:31 11/24/2015 12:31 10/06/2016 12:00 01/11/2017 12:54 04/21/2017 11:02 05/14/2018   Nitric Oxide Unknown 80 80 126 74 87 42 27     Latest Reference Range & Units 09/03/21 15:02  ANA PATTERN  Nuclear, Few Nuclear Dots !  ANA TITER titer 1:320 (H)  !: Data is abnormal (H): Data is abnormally high   Latest Reference Range & Units 09/04/12 14:25 09/03/21 15:02  Anti Nuclear Antibody (ANA) NEGATIVE   POSITIVE !  ANA Pattern 1   Nuclear, Dense Fine Speckled !  ANA Titer 1 titer  1:80 (H)  RA Latex Turbid. <14 IU/mL <10 <14  !: Data is abnormal CT Chest data  DG INJECT DIAG/THERA/INC NEEDLE/CATH/PLC EPI/LUMB/SAC W/IMG  Result Date: 09/14/2022 CLINICAL DATA:  Thoracolumbar region back pain, worse on the left. Good response to previous injection done at T12-L1 FLUOROSCOPY: Radiation Exposure Index (as provided by the fluoroscopic device): 0 minutes 59 seconds 52.31 micro gray meter squared PROCEDURE:  THORACIC EPIDURAL INJECTION An interlaminar approach was performed on the left at T12-L1. A 20 gauge epidural needle was advanced using loss-of-resistance technique. DIAGNOSTIC EPIDURAL INJECTION Injection of Isovue-M 300 shows a good epidural pattern with spread above and below the level of needle placement, spreading to both sides. No vascular opacification is seen. THERAPEUTIC EPIDURAL INJECTION 1.5 ml of Kenalog 40 mixed with 1 ml of 1% Lidocaine and 2 ml of normal saline were then instilled. The procedure was well-tolerated, and the patient was discharged thirty minutes following the injection in good condition. IMPRESSION: Technically successful repeat epidural injection on the left at T12-L1. Electronically Signed   By: Nelson Chimes M.D.   On: 09/14/2022 12:14   MR BREAST BILATERAL W WO CONTRAST INC CAD  Addendum Date: 09/13/2022   ADDENDUM REPORT: 09/13/2022 14:58 ADDENDUM: Correction needs to  be made to the body of the report - there is a left/right discrepancy. The findings section should read as follows: Right breast: No mass or abnormal enhancement. Left breast: In the lower outer quadrant of the left breast in the mid to posterior depth (series 6, image 93), there is a 5 mm round mass with persistent kinetics. Approximately 1 cm anterior to this mass, is a 7 mm area of non mass enhancement (series 6, image 93). No other masses or abnormal enhancement is found in the left breast. The impression and recommendation are correct. Electronically Signed   By: Ammie Ferrier M.D.   On: 09/13/2022 14:58   Result Date: 09/13/2022 CLINICAL DATA:  68 year old female presenting for high risk screening MRI due to strong family history of breast cancer including her mother at ages 61 and 37, sister at ages 61 and 94 and a niece at age 29. The patient is asymptomatic. EXAM: BILATERAL BREAST MRI WITH AND WITHOUT CONTRAST TECHNIQUE: Multiplanar, multisequence MR images of both breasts were obtained prior to and  following the intravenous administration of 7 ml of Vueway Three-dimensional MR images were rendered by post-processing of the original MR data on an independent workstation. The three-dimensional MR images were interpreted, and findings are reported in the following complete MRI report for this study. Three dimensional images were evaluated at the independent interpreting workstation using the DynaCAD thin client. COMPARISON:  None available. FINDINGS: Breast composition: b. Scattered fibroglandular tissue. Background parenchymal enhancement: Minimal Right breast: In the lower outer quadrant of the left breast in the mid to posterior depth (series 6, image 93), there is a 5 mm round mass with persistent kinetics. Approximately 1 cm anterior to this mass, is a 7 mm area of non mass enhancement (series 6, image 93). No other masses or abnormal enhancement is found in the left breast. Left breast: No mass or abnormal enhancement. Lymph nodes: No abnormal appearing lymph nodes. Ancillary findings:  None. IMPRESSION: 1.  There is a 5 mm round mass in the lower outer left breast. 2. There is a 7 mm area of non mass enhancement in the lower outer left breast. This is approximately 1 cm anterior to the 5 mm round mass. 3.  No MRI evidence of malignancy in the right breast. RECOMMENDATION: MRI guided biopsy is recommended for the 2 adjacent areas in the left breast (both seen in series 6, image 93). BI-RADS CATEGORY  4: Suspicious. Electronically Signed: By: Ammie Ferrier M.D. 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), Anemia, Anxiety, Asthma, CAD (coronary artery disease), CAP (community acquired pneumonia) (2016; 06/07/2016), Chronic bronchitis ([place]), Chronic lower back pain, Complication of anesthesia, COPD (chronic obstructive pulmonary disease) ([place]), Depression, Diverticulitis, Diverticulosis, GERD (gastroesophageal reflux disease), H/O hiatal hernia, Headache, History of  echocardiogram, Hyperglycemia (11/20/2015), Hyperlipidemia, mixed (09/11/2007), IBS (irritable bowel syndrome), Maxillary sinusitis, Normal cardiac stress test (11/2011), Obesity, OSA (obstructive sleep apnea) (02/2012), Osteoarthritis, Osteoporosis, Pneumonia (11/2011), Pulmonary nodules, S/P angioplasty with stent 08/23/17 ostial 2nd OM with DES synnergy (08/24/2017), and Schatzki's ring.   reports that she has never smoked. She has never been exposed to tobacco smoke. She has never used smokeless tobacco.  Past Surgical History:  Procedure Laterality Date   APPENDECTOMY  1989   CARDIAC CATHETERIZATION N/A 11/25/2014   Procedure: Right/Left Heart Cath and Coronary Angiography;  Surgeon: Belva Crome, MD;  Location: [facility];  Service: Cardiovascular;  Laterality: N/A;   [place]   COLONOSCOPY  03/2022   CORONARY ANGIOPLASTY WITH STENT PLACEMENT  08/23/2017   CORONARY STENT INTERVENTION N/A 08/23/2017   Procedure: CORONARY STENT INTERVENTION;  Surgeon: Burnell Blanks, MD;  Location: [facility];  Service: Cardiovascular;  Laterality: N/A;   HERNIA REPAIR  04/13/2012   VHR laparoscopic   LEFT HEART CATH AND CORONARY ANGIOGRAPHY N/A 08/23/2017   Procedure: LEFT HEART CATH AND CORONARY ANGIOGRAPHY;  Surgeon: Burnell Blanks, MD;  Location: [facility];  Service: Cardiovascular;  Laterality: N/A;   VENTRAL HERNIA REPAIR  04/13/2012   Procedure: LAPAROSCOPIC VENTRAL HERNIA;  Surgeon: Adin Hector,  MD;  Location: [facility];  Service: General;  Laterality: N/A;  laparoscopic repair of incarcerated hernia    Allergies  Allergen Reactions   Latuda [Lurasidone Hcl] Other (See Comments)    PER THE PT CAUSED RESTLESSNESS   Beclomethasone Dipropionate Hives and Other (See Comments)     weight gain   Flexeril [Cyclobenzaprine] Anxiety   Mometasone Furo-Formoterol Fum Hives and Other (See Comments)    weight gain   Sulfonamide Derivatives Hives and Rash   Statins Other (See Comments)    Myalgias, RLS    Immunization History  Administered Date(s) Administered   Influenza Split 04/20/2011, 04/20/2020   Influenza Whole 06/06/2007, 04/15/2008, 04/02/2009, 03/29/2012   Influenza, High Dose Seasonal PF 05/05/2015   Influenza,inj,Quad PF,6+ Mos 05/09/2013, 03/03/2014, 05/03/2016, 04/21/2017, 04/02/2018, 03/19/2019, 04/20/2020, 05/03/2021, 03/07/2022   Moderna Sars-Covid-2 Vaccination 08/15/2019, 09/17/2019, 05/16/2020, 12/04/2020   Pfizer Covid-19 Vaccine Bivalent Booster 52yrs & up 04/16/2021   Pneumococcal Conjugate-13 05/09/2013   Pneumococcal Polysaccharide-23 05/04/2005   Td 07/29/2009   Tdap 03/11/2015   Zoster Recombinat (Shingrix) 05/09/2019, 08/01/2019    Family History  Problem Relation Age of Onset   Breast cancer Mother    Hypertension Mother    Diabetes Mother    Cancer Mother        recurrent, metastatic breast cancer.    Diverticulosis Father    Prostate cancer Father    Breast cancer Sister    Cancer Sister        breast cancer, invasive ductal carcinoma in 2022,DCIS at 76 with 4 weeks of radiation, 5 years of Tamoxifen    Pulmonary embolism Brother        recurrent   Heart attack Maternal Grandfather    Cerebral palsy Son    Cancer Niece 74   Osteoporosis Niece    Stroke Neg Hx    Colon cancer Neg Hx    Esophageal cancer Neg Hx    Stomach cancer Neg Hx    Rectal cancer Neg Hx      Current Outpatient Medications:    albuterol (VENTOLIN HFA) 108 (90 Base)  MCG/ACT inhaler, Inhale 1-2 puffs into the lungs every 6 (six)  hours as needed., Disp: 8 g, Rfl: 2   aspirin EC 81 MG tablet, Take 81 mg by mouth daily. Swallow whole., Disp: , Rfl:    Benralizumab (FASENRA PEN) 30 MG/ML SOAJ, Inject 1 mL (30 mg total) into the skin every 8 (eight) weeks., Disp: 1 mL, Rfl: 2   Bioflavonoid Products (ESTER C PO), Take 500 mg by mouth at bedtime., Disp: , Rfl:    budesonide (ENTOCORT EC) 3 MG 24 hr capsule, Take 1 capsule (3 mg total) by mouth daily., Disp: 30 capsule, Rfl: 5   Calcium-Magnesium-Vitamin D (CALCIUM MAGNESIUM PO), Take 1 tablet by mouth at bedtime., Disp: , Rfl:    clonazePAM (KLONOPIN) 0.5 MG tablet, Take 1 tablet (0.5 mg total) by mouth 2 (two) times daily as needed (anxiety)., Disp: 30 tablet, Rfl: 0   doxepin (SINEQUAN) 25 MG capsule, Take 1 capsule (25 mg total) by mouth at bedtime., Disp: 30 capsule, Rfl: 3   Evolocumab (REPATHA SURECLICK) [id number] MG/ML SOAJ, INJECT 1 PEN INTO THE SKIN EVERY 14 DAYS (Patient taking differently: Inject 140 mg into the skin every 21 ( twenty-one) days.), Disp: 6 mL, Rfl: 3   famotidine (PEPCID) 20 MG tablet, TAKE 1 TABLET BY MOUTH TWICE A DAY (Patient taking differently: Take 20 mg by mouth daily.), Disp: 60 tablet, Rfl: 2   fexofenadine (ALLEGRA) 180 MG tablet, Take 180 mg by mouth as needed for allergies or rhinitis., Disp: , Rfl:    fluticasone (FLONASE) 50 MCG/ACT nasal spray, Place 1 spray into both nostrils daily as needed for allergies or rhinitis., Disp: , Rfl:    ipratropium-albuterol (DUONEB) 0.5-2.5 (3) MG/3ML SOLN, Take 3 mLs by nebulization every 6 (six) hours as needed. (Patient taking differently: Take 3 mLs by nebulization every 6 (six) hours as needed (For shortness of breath or wheezing).), Disp: 120 mL, Rfl: 5   pindolol (VISKEN) 5 MG tablet, Take 1 tablet (5 mg total) by mouth daily after breakfast., Disp: 30 tablet, Rfl: 3   risperiDONE (RISPERDAL) 0.5 MG tablet, Take 1 tablet (0.5 mg total) by mouth 2  (two) times daily at 8 am and 4 pm., Disp: 60 tablet, Rfl: 3   venlafaxine XR (EFFEXOR XR) 75 MG 24 hr capsule, Take 3 capsules (225 mg total) by mouth daily with breakfast., Disp: 90 capsule, Rfl: 1   VITAMIN D PO, Take 5,000 Units by mouth at bedtime., Disp: , Rfl:    nitroGLYCERIN (NITROSTAT) 0.4 MG SL tablet, PLACE 1 TABLET UNDER THE TONGUE EVERY 5 MINUTES AS NEEDED FOR CHEST PAIN. (Patient not taking: Reported on 08/26/2022), Disp: 25 tablet, Rfl: 8      Objective:   Vitals:   09/15/22 1440  BP: 104/60  Pulse: 80  Temp: 98.1 F (36.7 C)  TempSrc: Oral  SpO2: 98%  Weight: 156 lb 9.6 oz (71 kg)  Height: 5\' 1"  (1.549 m)    Estimated body mass index is 29.59 kg/m as calculated from the following:   Height as of this encounter: 5\' 1"  (1.549 m).   Weight as of this encounter: 156 lb 9.6 oz (71 kg).  @WEIGHTCHANGE @  [employer]   09/15/22 1440  Weight: 156 lb 9.6 oz (71 kg)     Physical Exam General: No distress. Looks well Neuro: Alert and Oriented x 3. GCS 15. Speech normal Psych: Pleasant Resp:  Barrel Chest - no.  Wheeze - no, Crackles - no, No overt respiratory distress CVS: Normal heart sounds. Murmurs - no Ext: Stigmata of Connective  Tissue Disease - no HEENT: Normal upper airway. PEERL +. No post nasal drip        Assessment:       ICD-10-CM   1. Severe persistent asthma with intensive monitoring  J45.50     2. A B P A-ALLERGIC BRONCHOPULMONARY ASPERGILLOSIS  B44.81     3. Hiatal hernia  K44.9     4. Preop respiratory exam  Z01.811          Plan:     Patient Instructions   Severe persistent asthma with intensive monitoring A B P A-ALLERGIC BRONCHOPULMONARY ASPERGILLOSIS Moderate persistent asthmatic bronchitis with acute exacerbation  -At this point in time there is no evidence of Fasenra failure.  Asthma is well controlled - recent covid and post viral cough is resolving  Plan -continue with Fasenra  - BUT   retstart asmanex low dose 2  puff twice daily - CMA to do refill - continue albuterol as needed  Preop REspiratory Evaluation  - low moderate risk for pulmonary complications following hystererectomy as long as asthma not in flare up in the week leading to surgery  Plan  - per surgeon - continue asthma meds post op  Hiatal hernia  Stable on CT chest oct 2022 Glad you are off fosfamax  Plan -per pcp Mosie Lukes, MD and Dr Levie Heritage  ANA positive  Glad rheum Dr Estanislado Pandy saw you in sept 2023 and reassured  Plan   Thoracic and lumbar and hip pain  - prob due to kyphoscoliosis and improved with new orthotics  Plan   -per your spine team   Followup 3 months or sooner if needed    SIGNATURE    Dr. Brand Males, M.D., F.C.C.P,  Pulmonary and Critical Care Medicine Staff Physician, [place] Director - Interstitial Lung Disease  Program  Pulmonary [place] at [place], [place], [address]  Pager: [phone], If no answer or between  15:00h - 7:00h: call [phone] Telephone: [phone]  2:57 PM 09/15/2022

## 2022-09-15 NOTE — Patient Instructions (Addendum)
  Severe persistent asthma with intensive monitoring A B P A-ALLERGIC BRONCHOPULMONARY ASPERGILLOSIS Moderate persistent asthmatic bronchitis with acute exacerbation  -At this point in time there is no evidence of Fasenra failure.  Asthma is well controlled - recent covid and post viral cough is resolving  Plan -continue with Fasenra  - BUT   retstart asmanex low dose 2 puff twice daily - CMA to do refill - continue albuterol as needed  Preop REspiratory Evaluation  - low moderate risk for pulmonary complications following hystererectomy as long as asthma not in flare up in the week leading to surgery  Plan  - per surgeon - continue asthma meds post op  Hiatal hernia  Stable on CT chest oct 2022 Glad you are off fosfamax  Plan -per pcp Mosie Lukes, MD and Dr Levie Heritage  ANA positive  Glad rheum Dr Estanislado Pandy saw you in sept 2023 and reassured  Plan   Thoracic and lumbar and hip pain  - prob due to kyphoscoliosis and improved with new orthotics  Plan   -per your spine team   Followup 3 months or sooner if needed

## 2022-09-19 ENCOUNTER — Ambulatory Visit
Admission: RE | Admit: 2022-09-19 | Discharge: 2022-09-19 | Disposition: A | Discharge status: Home / Self Care | Payer: PPO | Source: Ambulatory Visit | Attending: Obstetrics and Gynecology | Admitting: Obstetrics and Gynecology

## 2022-09-19 ENCOUNTER — Ambulatory Visit: Admission: RE | Admit: 2022-09-19 | Payer: PPO | Source: Ambulatory Visit

## 2022-09-19 DIAGNOSIS — R9389 Abnormal findings on diagnostic imaging of other specified body structures: Secondary | ICD-10-CM

## 2022-09-19 NOTE — Progress Notes (Unsigned)
Virtual Visit via Telephone Note   Because of Victoria [name]'s co-morbid illnesses, she is at least at moderate risk for complications without adequate follow up.  This format is felt to be most appropriate for this patient at this time.  The patient did not have access to video technology/had technical difficulties with video requiring transitioning to audio format only (telephone).  All issues noted in this document were discussed and addressed.  No physical exam could be performed with this format.  Please refer to the patient's chart for her consent to telehealth for Victoria [name].  Evaluation Performed:  Preoperative cardiovascular risk assessment _____________   Date:  09/19/2022   Patient ID:  Victoria [name], Victoria [name] 1955-10-22, MRN [id number] Patient Location:  Home Provider location:   Office  Primary Care Provider:  Mosie Lukes, MD Primary Cardiologist:  Ena Dawley, MD  Chief Complaint / Patient Profile   67 y.o. y/o female with a h/o CAD s/p DES to OM on 08/2017, HLD on PCSK9i, anxiety and OSA on CPAP  who is pending hysterectomy and presents today for telephonic preoperative cardiovascular risk assessment.  History of Present Illness    Victoria [name] is a 67 y.o. female who presents via audio/video conferencing for a telehealth visit today.  Pt was last seen in cardiology clinic on 04/07/2022 by Dr. Johney Frame.  At that time Victoria [name] was doing well but was experiencing frequent palpitations due to increased stress and anxiety.  She also endorsed chest discomfort and declined a repeat Myoview at that time.  She wore a ZIO monitor that revealed brief runs of SVT with no sustained arrhythmias.  The patient is now pending procedure as outlined above. Since her last visit, she reports that she experienced an episode of chest pain and diaphoresis while completing a MRI procedure yesterday.  The procedure had to be aborted due to her symptoms.  She reports that her  symptoms resolved spontaneously and did not require nitroglycerin.  She reports using nitroglycerin a few times between her previous visit in October. She denies any chest discomfort with increased activity. She denies any palpitations with her discomfort.  I advised her that it would be best to be seen in our office for clearance and further testing due to her recent episode of chest discomfort and previous episodes of chest discomfort in October.  She was in agreement with this plan and will be contacted for further instructions and appointment.  Past Medical History    Past Medical History:  Diagnosis Date   Allergic bronchopulmonary aspergillosis ([facility]) 2008   sees Dr Edmund Hilda pulmonology   Anemia    iron deficiency, resolved   Anxiety    Asthma    CAD (coronary artery disease)    a. LHC 6/16:  oOM1 60, pRCA 25 >> med Rx b. cath 3/19 2nd OM with 95% stenosis s/p synergy DES & anomalous RCA   CAP (community acquired pneumonia) 2016; 06/07/2016   Chronic bronchitis ([facility])    Chronic lower back pain    Complication of anesthesia    "think I have a hard time waking up from it"   COPD (chronic obstructive pulmonary disease) ([facility])    Depression    mild   Diverticulitis    Diverticulosis    GERD (gastroesophageal reflux disease)    H/O hiatal hernia    Headache    "weekly" (08/23/2017)   History of echocardiogram    Echo 6/16:  Mod LVH, EF  60-65%, no RWMA, Gr 1 DD, trivial MR, normal LA size.   Hyperglycemia 11/20/2015   Hyperlipidemia, mixed 09/11/2007   Qualifier: Diagnosis of  By: Jerold Coombe   Did not tolerate Lipitor, zocor, Lovastatin, Pravastatin, Livalo, Crestor even low dose    IBS (irritable bowel syndrome)    Maxillary sinusitis    Normal cardiac stress test 11/2011   No evidence of ischemia or infarct.   Calculated ejection fraction 72%.   Obesity    OSA (obstructive sleep apnea) 02/2012   has stopped using  cpap   Osteoarthritis    Osteoporosis     Pneumonia 11/2011   "before 2013 I hadn't had pneumonia since I was a child" (04/13/2012)   Pulmonary nodules    S/P angioplasty with stent 08/23/17 ostial 2nd OM with DES synnergy 08/24/2017   Schatzki's ring    Past Surgical History:  Procedure Laterality Date   APPENDECTOMY  1989   CARDIAC CATHETERIZATION N/A 11/25/2014   Procedure: Right/Left Heart Cath and Coronary Angiography;  Surgeon: Belva Crome, MD;  Location: [facility];  Service: Cardiovascular;  Laterality: N/A;   [place]   COLONOSCOPY  03/2022   CORONARY ANGIOPLASTY WITH STENT PLACEMENT  08/23/2017   CORONARY STENT INTERVENTION N/A 08/23/2017   Procedure: CORONARY STENT INTERVENTION;  Surgeon: Burnell Blanks, MD;  Location: [facility];  Service: Cardiovascular;  Laterality: N/A;   HERNIA REPAIR  04/13/2012   VHR laparoscopic   LEFT HEART CATH AND CORONARY ANGIOGRAPHY N/A 08/23/2017   Procedure: LEFT HEART CATH AND CORONARY ANGIOGRAPHY;  Surgeon: Burnell Blanks, MD;  Location: [facility];  Service: Cardiovascular;  Laterality: N/A;   VENTRAL HERNIA REPAIR  04/13/2012   Procedure: LAPAROSCOPIC VENTRAL HERNIA;  Surgeon: Adin Hector, MD;  Location: [facility];  Service: General;  Laterality: N/A;  laparoscopic repair of incarcerated hernia    Allergies  Allergies  Allergen Reactions   Latuda [Lurasidone Hcl] Other (See Comments)    PER THE PT CAUSED RESTLESSNESS   Beclomethasone Dipropionate Hives and Other (See Comments)     weight gain   Flexeril [Cyclobenzaprine] Anxiety   Mometasone Furo-Formoterol Fum Hives and Other (See Comments)    weight gain   Sulfonamide Derivatives Hives and Rash   Statins Other (See Comments)    Myalgias, RLS    Home Medications    Prior to Admission medications   Medication Sig Start Date End Date Taking? Authorizing Provider  albuterol (VENTOLIN HFA) 108 (90 Base) MCG/ACT inhaler Inhale 1-2 puffs into the lungs every 6 (six)  hours as needed. 03/07/22   Brand Males, MD  aspirin EC 81 MG tablet Take 81 mg by mouth daily. Swallow whole.    [provider]  Benralizumab (FASENRA PEN) 30 MG/ML SOAJ Inject 1 mL (30 mg total) into the skin every 8 (eight) weeks. 07/26/21   Brand Males, MD  Bioflavonoid Products (ESTER C PO) Take 500 mg by mouth at bedtime.    [provider]  budesonide (ENTOCORT EC) 3 MG 24 hr capsule Take 1 capsule (3 mg total) by mouth daily. 08/26/22   Pyrtle, Lajuan Lines, MD  Calcium-Magnesium-Vitamin D (CALCIUM MAGNESIUM PO) Take 1 tablet by mouth at bedtime.    [provider]  clonazePAM (KLONOPIN) 0.5 MG tablet Take 1 tablet (0.5 mg total) by mouth 2 (two) times daily as needed (anxiety). 09/15/22   Vista Mink, MD  doxepin (SINEQUAN) 25 MG capsule Take  1 capsule (25 mg total) by mouth at bedtime. 08/18/22   Parks Ranger, DO  Evolocumab (REPATHA SURECLICK) [id number] MG/ML SOAJ INJECT 1 PEN INTO THE SKIN EVERY 14 DAYS Patient taking differently: Inject 140 mg into the skin every 21 ( twenty-one) days. 01/06/22   Freada Bergeron, MD  famotidine (PEPCID) 20 MG tablet TAKE 1 TABLET BY MOUTH TWICE A DAY Patient taking differently: Take 20 mg by mouth daily. 11/17/21   Willia Craze, NP  fexofenadine (ALLEGRA) 180 MG tablet Take 180 mg by mouth as needed for allergies or rhinitis.    [provider]  fluticasone (FLONASE) 50 MCG/ACT nasal spray Place 1 spray into both nostrils daily as needed for allergies or rhinitis.    [provider]  ipratropium-albuterol (DUONEB) 0.5-2.5 (3) MG/3ML SOLN Take 3 mLs by nebulization every 6 (six) hours as needed. Patient taking differently: Take 3 mLs by nebulization every 6 (six) hours as needed (For shortness of breath or wheezing). 03/07/22   Brand Males, MD  nitroGLYCERIN (NITROSTAT) 0.4 MG SL tablet PLACE 1 TABLET UNDER THE TONGUE EVERY 5 MINUTES AS NEEDED FOR CHEST PAIN. Patient not taking: Reported  on 08/26/2022 02/04/22   Freada Bergeron, MD  pindolol (VISKEN) 5 MG tablet Take 1 tablet (5 mg total) by mouth daily after breakfast. 08/19/22   Parks Ranger, DO  risperiDONE (RISPERDAL) 0.5 MG tablet Take 1 tablet (0.5 mg total) by mouth 2 (two) times daily at 8 am and 4 pm. 08/18/22   Parks Ranger, DO  venlafaxine XR (EFFEXOR XR) 75 MG 24 hr capsule Take 3 capsules (225 mg total) by mouth daily with breakfast. 08/18/22   Parks Ranger, DO  VITAMIN D PO Take 5,000 Units by mouth at bedtime.    [provider]    Physical Exam    Vital Signs:  Victoria [name] does not have vital signs available for review today.  Given telephonic nature of communication, physical exam is limited. AAOx3. NAD. Normal affect.  Speech and respirations are unlabored.  Accessory Clinical Findings    None  Assessment & Plan    1.  Preoperative Cardiovascular Risk Assessment:  Telephone clearance not granted due to patient developing chest pain yesterday during MRI procedure.  I have forwarded her in for patient to our preoperative team for scheduling an office visit for further  Today, I have spent 8 minutes with the patient with telehealth technology discussing medical history, symptoms, and management plan.     Mable Fill, Marissa Nestle, NP  09/19/2022, 10:32 AM

## 2022-09-20 ENCOUNTER — Ambulatory Visit: Payer: PPO | Admitting: Physician Assistant

## 2022-09-20 ENCOUNTER — Ambulatory Visit: Payer: PPO | Attending: Cardiovascular Disease

## 2022-09-20 ENCOUNTER — Encounter: Payer: Self-pay | Admitting: *Deleted

## 2022-09-20 ENCOUNTER — Encounter: Payer: Self-pay | Admitting: Physician Assistant

## 2022-09-20 ENCOUNTER — Telehealth: Payer: Self-pay | Admitting: Nurse Practitioner

## 2022-09-20 ENCOUNTER — Other Ambulatory Visit: Payer: Self-pay | Admitting: Physician Assistant

## 2022-09-20 VITALS — BP 100/52 | HR 72 | Ht 61.0 in | Wt 157.0 lb

## 2022-09-20 DIAGNOSIS — E782 Mixed hyperlipidemia: Secondary | ICD-10-CM | POA: Diagnosis not present

## 2022-09-20 DIAGNOSIS — I25118 Atherosclerotic heart disease of native coronary artery with other forms of angina pectoris: Secondary | ICD-10-CM

## 2022-09-20 DIAGNOSIS — Z01810 Encounter for preprocedural cardiovascular examination: Secondary | ICD-10-CM | POA: Diagnosis not present

## 2022-09-20 DIAGNOSIS — R072 Precordial pain: Secondary | ICD-10-CM | POA: Diagnosis not present

## 2022-09-20 LAB — TROPONIN T: Troponin T (Highly Sensitive): 14 ng/L (ref 0–14)

## 2022-09-20 MED ORDER — NITROGLYCERIN 0.4 MG SL SUBL: 0.4000 mg | SUBLINGUAL_TABLET | SUBLINGUAL | 11 refills | Status: AC | PRN

## 2022-09-20 NOTE — Telephone Encounter (Signed)
S/w pt. Pt had tele visit today for surg clearance. Ambrose Pancoast, NP had to cancel and make appt in office.  Pt was having a breast procedure yesterday and it had to be aborted due to pt had chest pain and was clammy. Scheduled pt to see Richardson Dopp, PA today. Pt is trying to go out of town but concerned about chest pain. Pt does take nitro occasionally with relief but did not take one yesterday. Will send to [place] to [place].

## 2022-09-20 NOTE — Progress Notes (Addendum)
Cardiology Office Note:    Date:  09/20/2022  ID:  [name], DOB 1956-03-04, MRN [id number] [place] [facility] Cardiologist:  Freada Bergeron, MD      Patient Profile:   Coronary artery disease LHC 08/23/2017: S/p 3 x 16 mm DES to the OM 2 No obstructive disease in the RCA or LAD Myoview 03/15/2019: Normal perfusion, EF 74; low risk Ascending aortic dilation on echocardiogram CT 03/2021: no aneurysm  TTE 03/20/2019: EF 60-65, mildly increased posterior wall thickness, mild LVH, GR 1 DD, normal RVSF, trivial TR, mild AI dilated aorta (36 mm), normal PASP, RAP 3 Monitor 04/27/2022: NSR, average 78, 6 runs of SVT (longest 15 beats), <1% PACs/PVCs, no sustained arrhythmias Hyperlipidemia  Statin intol; PCSK9i Rx OSA on CPAP Iron deficiency anemia; history of GI bleed Asthma    History of Present Illness:   JARELYN SCHETTINO is a 67 y.o. female who returns for the evaluation of chest pain.  She was last seen by Dr. Johney Frame 04/07/2022.  She continued to have episodes of atypical chest pain but was felt to be overall stable.  She noted symptoms of palpitations and a 7-day monitor was obtained.  This demonstrated rare ectopy and brief episodes of SVT.    She was scheduled for virtual visit today for preoperative clearance.  She had an MRI procedure yesterday which was aborted due to chest pain and diaphoresis.  Her virtual visit was canceled and she was set up for evaluation in the office. She needs to undergo a hysterectomy with Dr. Helane Rima on 10/18/22 under gen anesthesia. She was at [facility] yesterday for a MRI guided breast bx. During contrast administration, she started having substernal chest pain described as squeezing. She noted pain up in her face as well and was diaphoretic. She did not have nausea, syncope or near syncope. She did not take NTG. Her symptoms probably lasted 10-15 min (at least). She has not had recurrent chest pain since. This was more intense than  what she has had in the past. She has not had any exertional chest pain prior to this. She has felt normal since yesterday.    Review of Systems  Constitutional: Negative for fever.  Respiratory:  Negative for cough.   Gastrointestinal:  Negative for hematochezia.  Genitourinary:  Negative for hematuria.   see HPI    Studies Reviewed:    EKG:  NSR, HR 72, normal axis, no ST-TW changes, QTc 429 ms  Risk Assessment/Calculations:             Physical Exam:   VS:  BP (!) 100/52   Pulse 72   Ht 5\' 1"  (1.549 m)   Wt 157 lb (71.2 kg)   SpO2 97%   BMI 29.66 kg/m    Wt Readings from Last 3 Encounters:  09/20/22 157 lb (71.2 kg)  09/15/22 156 lb 9.6 oz (71 kg)  09/05/22 153 lb (69.4 kg)    Constitutional:      Appearance: Healthy appearance. Not in distress.  Neck:     Vascular: No carotid bruit. JVD normal.  Pulmonary:     Breath sounds: Normal breath sounds. No wheezing. No rales.  Cardiovascular:     Normal rate. Regular rhythm. Normal S1. Normal S2.      Murmurs: There is no murmur.  Edema:    Peripheral edema absent.  Abdominal:     Palpations: Abdomen is soft.        ASSESSMENT AND PLAN:  CAD (coronary artery disease) Hx of DES to OM2 in 2019. Myoview in 2020 was low risk. She developed severe chest pain yesterday during an MRI with assoc diaphoresis and facial pain. She has not had recurrent symptoms since. Her EKG is normal. The duration of her pain was likely ~15 mins. She needs clearance for a hysterectomy later this month.  Obtain stat hsTroponin If Trop is abnormal, she will need to go to the ED for admission and probable cardiac catheterization  If Trop is normal, proceed with Lexiscan Myoview  Continue ASA 81 mg once daily, NTG prn chest pain, pindolol 5 mg once daily, Repatha 140 mg every 21 days F/u 6 mos or sooner if workup abnormal/recurrent symptoms.   Preoperative cardiovascular examination Her RCRI (Revised Cardiac Risk Index) is 2 placing her at  high risk (6.6%) for perioperative major cardiac events. She had an episode of chest pain yesterday concerning for angina. She requires further workup to better assess her perioperative risk. As noted, she will have a stat Troponin drawn today. If this is abnormal, she will need to be admitted for further workup. If this is normal, she will need a nuclear stress test to better evaluate her symptoms. Further recommendations to follow.   Addendum 09/23/2022: Stat troponin was normal.  Nuclear stress test was also normal.  The patient may proceed with her surgery at acceptable risk.  She can hold aspirin for 7 days prior to her surgery.  This should be resumed soon as possible after surgery when felt to be safe.  Hyperlipidemia, mixed Intol to statins. Continue Repatha 140 mg every 21 days.     Shared Decision Making/Informed Consent The risks [chest pain, shortness of breath, cardiac arrhythmias, dizziness, blood pressure fluctuations, myocardial infarction, stroke/transient ischemic attack, nausea, vomiting, allergic reaction, radiation exposure, metallic taste sensation and life-threatening complications (estimated to be 1 in 10,000)], benefits (risk stratification, diagnosing coronary artery disease, treatment guidance) and alternatives of a nuclear stress test were discussed in detail with Ms. Dunne and she agrees to proceed.   Dispo:  Return in about 6 months (around 03/22/2023) for Routine Follow Up, w/ Dr. Shari Prows.  Signed, Tereso Newcomer, PA-C

## 2022-09-20 NOTE — Telephone Encounter (Signed)
See my note from today. Richardson Dopp, PA-C    09/20/2022 5:32 PM

## 2022-09-20 NOTE — Patient Instructions (Signed)
Medication Instructions:  Your physician recommends that you continue on your current medications as directed. Please refer to the Current Medication list given to you today.  *If you need a refill on your cardiac medications before your next appointment, please call your pharmacy*   Lab Work: Stat troponin--TODAY If you have labs (blood work) drawn today and your tests are completely normal, you will receive your results only by: [facility] (if you have MyChart) OR A paper copy in the mail If you have any lab test that is abnormal or we need to change your treatment, we will call you to review the results.   Testing/Procedures: Your physician has requested that you have a lexiscan myoview. For further information please visit [email or web address]. Please follow instruction sheet, as given.    Follow-Up: At [facility], you and your health needs are our priority.  As part of our continuing mission to provide you with exceptional heart care, we have created designated Provider Care Teams.  These Care Teams include your primary Cardiologist (physician) and Advanced Practice Providers (APPs -  Physician Assistants and Nurse Practitioners) who all work together to provide you with the care you need, when you need it.  Your next appointment:   6 month(s)  Provider:   Dr Johney Frame

## 2022-09-20 NOTE — Assessment & Plan Note (Signed)
Her RCRI (Revised Cardiac Risk Index) is 2 placing her at high risk (6.6%) for perioperative major cardiac events. She had an episode of chest pain yesterday concerning for angina. She requires further workup to better assess her perioperative risk. As noted, she will have a stat Troponin drawn today. If this is abnormal, she will need to be admitted for further workup. If this is normal, she will need a nuclear stress test to better evaluate her symptoms. Further recommendations to follow.

## 2022-09-20 NOTE — Assessment & Plan Note (Signed)
Intol to statins. Continue Repatha 140 mg every 21 days.

## 2022-09-20 NOTE — Telephone Encounter (Signed)
   Name: Victoria [name]  DOB: 1956/06/11  MRN: [id number]  Primary Cardiologist: Ena Dawley, MD  Chart reviewed as part of pre-operative protocol coverage. Because of Victoria [name]'s past medical history and time since last visit, she will require a follow-up in-office visit in order to better assess preoperative cardiovascular risk.  Pre-op covering staff: - Please schedule appointment and call patient to inform them. If patient already had an upcoming appointment within acceptable timeframe, please add "pre-op clearance" to the appointment notes so provider is aware. - Please contact requesting surgeon's office via preferred method (i.e, phone, fax) to inform them of need for appointment prior to surgery.   Mable Fill, Victoria Nestle, NP  09/20/2022, 9:58 AM

## 2022-09-20 NOTE — Assessment & Plan Note (Signed)
Hx of DES to OM2 in 2019. Myoview in 2020 was low risk. She developed severe chest pain yesterday during an MRI with assoc diaphoresis and facial pain. She has not had recurrent symptoms since. Her EKG is normal. The duration of her pain was likely ~15 mins. She needs clearance for a hysterectomy later this month.  Obtain stat hsTroponin If Trop is abnormal, she will need to go to the ED for admission and probable cardiac catheterization  If Trop is normal, proceed with Lexiscan Myoview  Continue ASA 81 mg once daily, NTG prn chest pain, pindolol 5 mg once daily, Repatha 140 mg every 21 days F/u 6 mos or sooner if workup abnormal/recurrent symptoms.

## 2022-09-21 ENCOUNTER — Encounter (HOSPITAL_COMMUNITY): Payer: Self-pay

## 2022-09-21 NOTE — Progress Notes (Signed)
Pt has been made aware of normal result and verbalized understanding.  jw

## 2022-09-22 ENCOUNTER — Ambulatory Visit (HOSPITAL_COMMUNITY): Payer: PPO | Attending: Physician Assistant

## 2022-09-22 DIAGNOSIS — R072 Precordial pain: Secondary | ICD-10-CM | POA: Insufficient documentation

## 2022-09-22 LAB — MYOCARDIAL PERFUSION IMAGING
LV dias vol: 67 mL (ref 46–106)
LV sys vol: 18 mL
Nuc Stress EF: 73 %
Peak HR: 99 {beats}/min
Rest HR: 69 {beats}/min
Rest Nuclear Isotope Dose: 10.8 mCi
SDS: 1
SRS: 0
SSS: 1
ST Depression (mm): 0 mm
Stress Nuclear Isotope Dose: 31.3 mCi
TID: 1.01

## 2022-09-22 MED ORDER — REGADENOSON 0.4 MG/5ML IV SOLN
0.4000 mg | Freq: Once | INTRAVENOUS | Status: AC
  Administered 2022-09-22: 0.4 mg via INTRAVENOUS

## 2022-09-22 MED ORDER — TECHNETIUM TC 99M TETROFOSMIN IV KIT
31.3000 | PACK | Freq: Once | INTRAVENOUS | Status: AC | PRN
  Administered 2022-09-22: 31.3 via INTRAVENOUS

## 2022-09-22 MED ORDER — TECHNETIUM TC 99M TETROFOSMIN IV KIT
10.8000 | PACK | Freq: Once | INTRAVENOUS | Status: AC | PRN
  Administered 2022-09-22: 10.8 via INTRAVENOUS

## 2022-09-29 ENCOUNTER — Inpatient Hospital Stay: Admission: RE | Admit: 2022-09-29 | Payer: PPO | Source: Ambulatory Visit

## 2022-09-29 ENCOUNTER — Other Ambulatory Visit: Payer: PPO

## 2022-09-30 ENCOUNTER — Telehealth (HOSPITAL_BASED_OUTPATIENT_CLINIC_OR_DEPARTMENT_OTHER): Payer: PPO | Admitting: Psychiatry

## 2022-09-30 ENCOUNTER — Encounter (HOSPITAL_COMMUNITY): Payer: Self-pay | Admitting: Psychiatry

## 2022-09-30 ENCOUNTER — Other Ambulatory Visit: Payer: PPO

## 2022-09-30 DIAGNOSIS — F32A Depression, unspecified: Secondary | ICD-10-CM | POA: Diagnosis not present

## 2022-09-30 DIAGNOSIS — F419 Anxiety disorder, unspecified: Secondary | ICD-10-CM | POA: Diagnosis not present

## 2022-09-30 DIAGNOSIS — G4733 Obstructive sleep apnea (adult) (pediatric): Secondary | ICD-10-CM

## 2022-09-30 MED ORDER — VENLAFAXINE HCL ER 75 MG PO CP24
225.0000 mg | ORAL_CAPSULE | Freq: Every day | ORAL | 1 refills | Status: DC
Start: 2022-09-30 — End: 2022-11-15

## 2022-09-30 NOTE — Progress Notes (Signed)
BH MD/PA/NP OP Progress Note  09/30/2022 10:29 AM Victoria [name]  MRN:  [id number]  Visit Diagnosis:    ICD-10-CM   1. Anxiety and depression  F41.9    F32.A     2. OSA (obstructive sleep apnea)  G47.33       Assessment: Victoria [name] is a 67 y.o. female with a history of anxiety, depression, OSA who presented to [facility] at [facility] for initial evaluation on 06/30/2022.  At initial evaluation patient reported symptoms of anxiety including feeling nervous or on edge, being unable to stop controlled worrying, worrying too much about different things, and fear that something awful would happen.  Patient's symptoms have been gradually progressing.  There was a period in November 2023 where she was having passive SI without intent or plan.  Patient also did endorse low mood, poor sleep, difficulty concentrating.  She does have a history of obstructive sleep apnea and does not use CPAP with last sleep study being over 10 years ago. Psychosocially patient has increased stress of caring for her adult son with cerebral palsy and planning the next steps for him after she and her husband pass or are no longer able to care for him.  Patient met criteria for MDD and generalized anxiety disorder. Of note on exam patient was found to have smacking of her lips repetitive movements of her mouth which are concerning for parkinsonism secondary to Zyprexa.    Victoria [name] presents for follow-up evaluation. Today, 09/30/22, patient reports that the past month has gone fairly well in regards to her anxiety and sleep.  There have been stressors medically that she has handled well.  Patient also has rescheduled her surgical procedure that had been canceled earlier this year. She denies any adverse side effects from medication and we will plan to continue on her current regimen at this time.  Plan: - Continue Effexor XR 225 mg QD - Continue Risperdal 0.5 mg BID - Continue Doxepin 25 mg QHS -  Continue klonopin 0.5 mg QD prn for anxiety - Continue pindolol 5 mg QD - Discontinue Ativan - Recommend repeat sleep study, could consider dental devices OSA still present - CMP, CBC, lipid profile, Vit D, A1c, TSH reviewed - Continue with therapy [place] - Joining a 10 week wellness group through the [facility], later will have a group for special needs parents - Crisis resources reviewed - Follow up in a month    Chief Complaint:  Chief Complaint  Patient presents with   Follow-up   HPI: Victoria [name] presents reporting that she has been doing pretty good over the past month.  She notes that her anxiety and depressive symptoms have been under good control with her current regimen.  She has also been attending therapy and groups at the [facility] which she has found helpful.  She only has a few more sessions of her current group and is getting starting a new group for parents with special needs kids.  Victoria [name] denies any adverse side effects from medications.  She had noticed some weight gain and increased appetite lately from the Risperdal initially but that has improved.  Occasionally she will forget to take the Risperdal second dose at 4 5 and she was informed is okay to take it later in the evening if so.  Patient would like to continue on her current regimen for now as she has a few upcoming stressors and does not want to decline.  She  notes that she had an MRI this past month that showed to spots in her breasts that need to be biopsied.  When she went through the biopsy she had chest pain and the procedure had to be stopped.  Patient is unsure of the cause for this as she denies feeling anxious during that time.  She is rescheduled later next week and will have her husband accompany her.  Patient also asked about taking Ativan prior to procedure and she was instructed that she can do this but should hold her Klonopin that day.  We will continue on her current regimen and did warn of  concerns her dependence/tolerance with the benzodiazepines.  Patient is aware and can consider potentially changing Klonopin to as needed at her next visit.  Past Psychiatric History: One  prior psychiatric hospitalization to [facility] in February of 2024 due to increased depression/anxiety. No prior suicide attempts.  Patient did endorse passive SI in November of 2023 without any intent or plan.  She denies being connected with a psychiatrist in the past and had recently connected with a therapist at [facility] therapy.  Has taken Effexor (possible restlessness at 225), Cymbalta, Remeron, Ativan, Xanax, Klonopin, gabapentin, BuSpar, Zyprexa (TD), Latuda (akathisia), Risperdal, Doxepin,   Denies substance use other than 1 beer a day. Can increase to 2 when the weather is nice.  Past Medical History:  Past Medical History:  Diagnosis Date   Allergic bronchopulmonary aspergillosis 2008   sees Dr Wenda Overland pulmonology   Anemia    iron deficiency, resolved   Anxiety    Asthma    CAD (coronary artery disease)    a. LHC 6/16:  oOM1 60, pRCA 25 >> med Rx b. cath 3/19 2nd OM with 95% stenosis s/p synergy DES & anomalous RCA   CAP (community acquired pneumonia) 2016; 06/07/2016   Chronic bronchitis    Chronic lower back pain    Complication of anesthesia    "think I have a hard time waking up from it"   COPD (chronic obstructive pulmonary disease)    Depression    mild   Diverticulitis    Diverticulosis    GERD (gastroesophageal reflux disease)    H/O hiatal hernia    Headache    "weekly" (08/23/2017)   History of echocardiogram    Echo 6/16:  Mod LVH, EF 60-65%, no RWMA, Gr 1 DD, trivial MR, normal LA size.   Hyperglycemia 11/20/2015   Hyperlipidemia, mixed 09/11/2007   Qualifier: Diagnosis of  By: Janit Bern   Did not tolerate Lipitor, zocor, Lovastatin, Pravastatin, Livalo, Crestor even low dose    IBS (irritable bowel syndrome)    Maxillary sinusitis    Normal cardiac stress test  11/2011   No evidence of ischemia or infarct.   Calculated ejection fraction 72%.   Obesity    OSA (obstructive sleep apnea) 02/2012   has stopped using  cpap   Osteoarthritis    Osteoporosis    Pneumonia 11/2011   "before 2013 I hadn't had pneumonia since I was a child" (04/13/2012)   Pulmonary nodules    S/P angioplasty with stent 08/23/17 ostial 2nd OM with DES synnergy 08/24/2017   Schatzki's ring     Past Surgical History:  Procedure Laterality Date   APPENDECTOMY  1989   CARDIAC CATHETERIZATION N/A 11/25/2014   Procedure: Right/Left Heart Cath and Coronary Angiography;  Surgeon: Lyn Records, MD;  Location: [facility] INVASIVE CV LAB;  Service: Cardiovascular;  Laterality: N/A;  CESAREAN SECTION  1985   COLONOSCOPY  03/2022   CORONARY ANGIOPLASTY WITH STENT PLACEMENT  08/23/2017   CORONARY STENT INTERVENTION N/A 08/23/2017   Procedure: CORONARY STENT INTERVENTION;  Surgeon: Kathleene Hazel, MD;  Location: [facility] INVASIVE CV LAB;  Service: Cardiovascular;  Laterality: N/A;   HERNIA REPAIR  04/13/2012   VHR laparoscopic   LEFT HEART CATH AND CORONARY ANGIOGRAPHY N/A 08/23/2017   Procedure: LEFT HEART CATH AND CORONARY ANGIOGRAPHY;  Surgeon: Kathleene Hazel, MD;  Location: [facility] INVASIVE CV LAB;  Service: Cardiovascular;  Laterality: N/A;   VENTRAL HERNIA REPAIR  04/13/2012   Procedure: LAPAROSCOPIC VENTRAL HERNIA;  Surgeon: [name], MD;  Location: [facility] OR;  Service: General;  Laterality: N/A;  laparoscopic repair of incarcerated hernia    Family History:  Family History  Problem Relation Age of Onset   Breast cancer Mother    Hypertension Mother    Diabetes Mother    Cancer Mother        recurrent, metastatic breast cancer.    Diverticulosis Father    Prostate cancer Father    Breast cancer Sister    Cancer Sister        breast cancer, invasive ductal carcinoma in 2022,DCIS at 35 with 4 weeks of radiation, 5 years of Tamoxifen    Pulmonary embolism Brother         recurrent   Heart attack Maternal Grandfather    Cerebral palsy Son    Cancer Niece 10   Osteoporosis Niece    Stroke Neg Hx    Colon cancer Neg Hx    Esophageal cancer Neg Hx    Stomach cancer Neg Hx    Rectal cancer Neg Hx     Social History:  Social History   Socioeconomic History   Marital status: Married    Spouse name: Not on file   Number of children: 1   Years of education: 14   Highest education level: Associate degree: academic program  Occupational History   Occupation: Disabled   Tobacco Use   Smoking status: Never    Passive exposure: Never   Smokeless tobacco: Never  Vaping Use   Vaping Use: Never used  Substance and Sexual Activity   Alcohol use: Yes    Comment: social use- last drink 3 weeks ago   Drug use: No   Sexual activity: Yes    Comment: gluten free, lives with husband and son with CP quadriplegia  Other Topics Concern   Not on file  Social History Narrative   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    Lives at home with her husband and son.   Right-handed.   2 cups caffeine per day.   One story home   Social Determinants of Health   Financial Resource Strain: Low Risk  (10/19/2021)   Overall Financial Resource Strain (CARDIA)    Difficulty of Paying Living Expenses: Not hard at all  Food Insecurity: No Food Insecurity (08/19/2022)   Hunger Vital Sign    Worried About Running Out of Food in the Last Year: Never true    Ran Out of Food in the Last Year: Never true  Transportation Needs: No Transportation Needs (08/19/2022)   PRAPARE - Administrator, Civil Service (Medical): No    Lack of Transportation (Non-Medical): No  Physical Activity: Insufficiently Active (10/19/2021)   Exercise Vital Sign    Days of Exercise per Week: 7 days    Minutes of Exercise per Session:  20 min  Stress: Stress Concern Present (10/19/2021)   [employer] of Occupational Health - Occupational Stress Questionnaire    Feeling of Stress : Rather much   Social Connections: Moderately Integrated (10/19/2021)   Social Connection and Isolation Panel [NHANES]    Frequency of Communication with Friends and Family: More than three times a week    Frequency of Social Gatherings with Friends and Family: Once a week    Attends Religious Services: More than 4 times per year    Active Member of [employer] or Organizations: No    Attends Banker Meetings: Never    Marital Status: Married    Allergies:  Allergies  Allergen Reactions   Latuda [Lurasidone Hcl] Other (See Comments)    PER THE PT CAUSED RESTLESSNESS   Beclomethasone Dipropionate Hives and Other (See Comments)     weight gain   Flexeril [Cyclobenzaprine] Anxiety   Mometasone Furo-Formoterol Fum Hives and Other (See Comments)    weight gain   Sulfonamide Derivatives Hives and Rash   Statins Other (See Comments)    Myalgias, RLS    Current Medications: Current Outpatient Medications  Medication Sig Dispense Refill   albuterol (VENTOLIN HFA) 108 (90 Base) MCG/ACT inhaler Inhale 1-2 puffs into the lungs every 6 (six) hours as needed. 8 g 2   aspirin EC 81 MG tablet Take 81 mg by mouth daily. Swallow whole.     Benralizumab (FASENRA PEN) 30 MG/ML SOAJ Inject 1 mL (30 mg total) into the skin every 8 (eight) weeks. 1 mL 2   Bioflavonoid Products (ESTER C PO) Take 500 mg by mouth at bedtime.     budesonide (ENTOCORT EC) 3 MG 24 hr capsule Take 1 capsule (3 mg total) by mouth daily. 30 capsule 5   Calcium-Magnesium-Vitamin D (CALCIUM MAGNESIUM PO) Take 1 tablet by mouth at bedtime.     clonazePAM (KLONOPIN) 0.5 MG tablet Take 1 tablet (0.5 mg total) by mouth 2 (two) times daily as needed (anxiety). 30 tablet 0   doxepin (SINEQUAN) 25 MG capsule Take 1 capsule (25 mg total) by mouth at bedtime. 30 capsule 3   Evolocumab (REPATHA SURECLICK) 140 MG/ML SOAJ Inject 140 mg into the skin every 21 ( twenty-one) days.     famotidine (PEPCID) 20 MG tablet Take 20 mg by mouth daily.      fexofenadine (ALLEGRA) 180 MG tablet Take 180 mg by mouth as needed for allergies or rhinitis.     fluticasone (FLONASE) 50 MCG/ACT nasal spray Place 1 spray into both nostrils daily as needed for allergies or rhinitis.     ipratropium-albuterol (DUONEB) 0.5-2.5 (3) MG/3ML SOLN Take 3 mLs by nebulization every 6 (six) hours as needed. 120 mL 5   nitroGLYCERIN (NITROSTAT) 0.4 MG SL tablet Place 1 tablet (0.4 mg total) under the tongue every 5 (five) minutes as needed for chest pain. 25 tablet 11   pindolol (VISKEN) 5 MG tablet Take 1 tablet (5 mg total) by mouth daily after breakfast. 30 tablet 3   risperiDONE (RISPERDAL) 0.5 MG tablet Take 1 tablet (0.5 mg total) by mouth 2 (two) times daily at 8 am and 4 pm. 60 tablet 3   venlafaxine XR (EFFEXOR XR) 75 MG 24 hr capsule Take 3 capsules (225 mg total) by mouth daily with breakfast. 90 capsule 1   VITAMIN D PO Take 5,000 Units by mouth at bedtime.     No current facility-administered medications for this visit.     Psychiatric Specialty  Exam: Review of Systems  There were no vitals taken for this visit.There is no height or weight on file to calculate BMI.  General Appearance: Well Groomed  Eye Contact:  Good  Speech:  Clear and Coherent and Normal Rate  Volume:  Normal  Mood:  Euthymic  Affect:  Congruent  Thought Process:  Coherent and Goal Directed  Orientation:  Full (Time, Place, and Person)  Thought Content: Logical   Suicidal Thoughts:  No  Homicidal Thoughts:  No  Memory:  NA  Judgement:  Good  Insight:  Fair  Psychomotor Activity:  Normal  Concentration:  Concentration: Good  Recall:  Fair  Fund of Knowledge: Fair  Language: Good  Akathisia:  No    AIMS (if indicated): not done  Assets:  Communication Skills Desire for Improvement Housing Intimacy Social Support Transportation Vocational/Educational  ADL's:  Intact  Cognition: WNL  Sleep:  Poor   Metabolic Disorder Labs: Lab Results  Component Value Date    HGBA1C 5.6 05/26/2022   MPG 117 10/22/2008   Lab Results  Component Value Date   PROLACTIN 15.4 08/12/2022   Lab Results  Component Value Date   CHOL 208 (H) 05/26/2022   TRIG 113.0 05/26/2022   HDL 74.00 05/26/2022   CHOLHDL 3 05/26/2022   VLDL 22.6 05/26/2022   LDLCALC 111 (H) 05/26/2022   LDLCALC 95 07/06/2021   Lab Results  Component Value Date   TSH 1.788 08/12/2022   TSH 1.43 05/26/2022    Therapeutic Level Labs: No results found for: "LITHIUM" No results found for: "VALPROATE" No results found for: "CBMZ"   Screenings: AIMS    Flowsheet Row Admission (Discharged) from 08/12/2022 in [facility] BE[facility]AL MEDICINE  AIMS Total Score 2      AUDIT    Flowsheet Row Admission (Discharged) from 08/12/2022 in [facility] BE[facility]IORAL MEDIC[facility]  Alcohol Use Disorder Identification Test Final Score (AUDIT) 0      GAD-7    Flowsheet Row [facility]20[facility] Office Visit from 05/26/2022 in [facility] at [facility] Office Visit from 01/16/2017 in [facility] Primary Care at [facility]  Total GAD-7 Score 11 11 3       PHQ2-9    Flowsheet Row Telephone from 08/19/2022 in [facility] Office Visit from 06/30/2022 in [facility] Office Visit from 05/26/2022 in [facility] at [facility] Video Visit from 11/25/2021 in [facility] Primary Care at [facility] Clinical Support from 10/19/2021 in [facility] Primary Care at [facility]  PHQ-2 Total Score 2 3 3  0 0  PHQ-9 Total Score -- 4 4 4  --      Flowsheet Row Admission (Discharged) from 08/12/2022 in [facility] BEHAVIORAL MEDICINE Most recent reading at 08/12/2022  9:31 PM ED from 08/12/2022 in [facility] Most recent reading at 08/12/2022  3:47 PM ED from  08/02/2022 in [facility] Most recent reading at 08/02/2022 12:53 PM  C-SSRS RISK CATEGORY Low Risk No Risk Low Risk       Collaboration of Care: Collaboration of Care: Medication Management AEB medication prescription and Other provider involved in patient's care AEB cardiology chart review  Patient/Guardian was advised Release of Information must be obtained prior to any record release in order to collaborate their care with an outside provider. Patient/Guardian was advised if they have  not already done so to contact the registration department to sign all necessary forms in order for [name] to release information regarding their care.   Consent: Patient/Guardian gives verbal consent for treatment and assignment of benefits for services provided during this visit. Patient/Guardian expressed understanding and agreed to proceed.    Stasia Cavalier, MD 09/30/2022, 10:29 AM   Virtual Visit via Video Note  I connected with Victoria [name] on 09/30/22 at 10:30 AM EDT by a video enabled telemedicine application and verified that I am speaking with the correct person using two identifiers.  Location: Patient: Home Provider: Home Office   I discussed the limitations of evaluation and management by telemedicine and the availability of in person appointments. The patient expressed understanding and agreed to proceed.   I discussed the assessment and treatment plan with the patient. The patient was provided an opportunity to ask questions and all were answered. The patient agreed with the plan and demonstrated an understanding of the instructions.   The patient was advised to call back or seek an in-person evaluation if the symptoms worsen or if the condition fails to improve as anticipated.  I provided 15 minutes of non-face-to-face time during this encounter.   Stasia Cavalier, MD

## 2022-10-03 ENCOUNTER — Encounter: Payer: Self-pay | Admitting: *Deleted

## 2022-10-05 ENCOUNTER — Ambulatory Visit
Admission: RE | Admit: 2022-10-05 | Discharge: 2022-10-05 | Disposition: A | Discharge status: Home / Self Care | Payer: PPO | Source: Ambulatory Visit | Attending: Obstetrics and Gynecology | Admitting: Obstetrics and Gynecology

## 2022-10-05 DIAGNOSIS — C50512 Malignant neoplasm of lower-outer quadrant of left female breast: Secondary | ICD-10-CM | POA: Diagnosis not present

## 2022-10-05 DIAGNOSIS — R928 Other abnormal and inconclusive findings on diagnostic imaging of breast: Secondary | ICD-10-CM

## 2022-10-05 DIAGNOSIS — N6012 Diffuse cystic mastopathy of left breast: Secondary | ICD-10-CM | POA: Diagnosis not present

## 2022-10-05 DIAGNOSIS — R9389 Abnormal findings on diagnostic imaging of other specified body structures: Secondary | ICD-10-CM

## 2022-10-05 DIAGNOSIS — N6323 Unspecified lump in the left breast, lower outer quadrant: Secondary | ICD-10-CM | POA: Diagnosis not present

## 2022-10-05 MED ORDER — GADOPICLENOL 0.5 MMOL/ML IV SOLN
7.0000 mL | Freq: Once | INTRAVENOUS | Status: AC | PRN
  Administered 2022-10-05: 7 mL via INTRAVENOUS

## 2022-10-07 ENCOUNTER — Telehealth: Payer: Self-pay | Admitting: Hematology and Oncology

## 2022-10-07 NOTE — Telephone Encounter (Signed)
Spoke to patient to confirm upcoming morning [facility] clinic appointment on 4/24, paperwork will be sent via e-mail.   Gave location and time, also informed patient that the surgeon's office would be calling as well to get information from them similar to the packet that they will be receiving so make sure to do both.  Reminded patient that all providers will be coming to the clinic to see them HERE and if they had any questions to not hesitate to reach back out to myself or their navigators.

## 2022-10-10 ENCOUNTER — Telehealth: Payer: Self-pay | Admitting: Hematology

## 2022-10-10 ENCOUNTER — Other Ambulatory Visit (HOSPITAL_COMMUNITY): Payer: Self-pay | Admitting: Psychiatry

## 2022-10-10 ENCOUNTER — Encounter: Payer: Self-pay | Admitting: *Deleted

## 2022-10-10 DIAGNOSIS — C50512 Malignant neoplasm of lower-outer quadrant of left female breast: Secondary | ICD-10-CM

## 2022-10-10 DIAGNOSIS — Z853 Personal history of malignant neoplasm of breast: Secondary | ICD-10-CM | POA: Insufficient documentation

## 2022-10-10 DIAGNOSIS — N814 Uterovaginal prolapse, unspecified: Secondary | ICD-10-CM | POA: Diagnosis not present

## 2022-10-10 DIAGNOSIS — F32A Depression, unspecified: Secondary | ICD-10-CM

## 2022-10-10 NOTE — Telephone Encounter (Signed)
Patient wanted to move her morning appointment to the afternoon, after talking to Dr, Adalberto Ill, she said Dr. Adalberto Ill recommended Dr. Carolynne Edouard or Dr. Dwain Sarna and wanted to know if this was an option, I spoke with the team and they advise it was okay to move her

## 2022-10-11 ENCOUNTER — Encounter: Payer: Self-pay | Admitting: *Deleted

## 2022-10-11 ENCOUNTER — Other Ambulatory Visit: Payer: Self-pay | Admitting: *Deleted

## 2022-10-11 DIAGNOSIS — C50512 Malignant neoplasm of lower-outer quadrant of left female breast: Secondary | ICD-10-CM

## 2022-10-11 NOTE — Progress Notes (Signed)
Radiation Oncology         ([phone]) [phone] ________________________________  Multidisciplinary Breast Oncology Clinic [facility]) Initial Outpatient Consultation  Name: Victoria [name] MRN: [id number]  Date: 10/12/2022  DOB: 1955/10/03  WU:[id number], Victoria Lions, MD  Victoria Miner, MD   REFERRING PHYSICIAN: [name], MD  DIAGNOSIS: There were no encounter diagnoses.  Stage *** Left Breast LOQ, Invasive Ductal Carcinoma, ER+ / PR+ / Her2-, Grade 1  No diagnosis found.  HISTORY OF PRESENT ILLNESS::Victoria [name] is a 67 y.o. female who is presenting to the office today for evaluation of her newly diagnosed breast cancer. She is accompanied by ***. She is doing well overall. The patient has a very strong family history of breast cancer including breast cancer in her mother at the ages of 20 and 59, in her sister at the ages of 21 and 59, and in her niece at the age of 48.   In light of her strong family history, the patient participates in high-risk screening MRI's. She recently presented for a bilateral breast MRI on 09/12/22 which showed a 5 mm round mass with persistent kinetics in the lower outer left breast at a mid-posterior depth, as well as a 7 mm area of non-mass enhancement 1 cm anterior to the lower outer left breast mas. No other masses or abnormalities were appreciated in either breast.   Biopsy of the lower outer anterior left breast on 10/05/22 showed: benign findings with usual ductal hyperplasia.   Biopsy of the lower outer posterior left breast however showed grade 1 invasive ductal carcinoma measuring 3 mm in the greatest linear extent of the sample. Prognostic indicators significant for: estrogen receptor, 100% positive and progesterone receptor, 100% positive, both with strong staining intensity. Proliferation marker Ki67 at 5%. HER2 negative.  Menarche: *** years old Age at first live birth: *** years old GP: *** LMP: *** Contraceptive: *** HRT: ***   The patient was  referred today for presentation in the multidisciplinary conference.  Radiology studies and pathology slides were presented there for review and discussion of treatment options.  A consensus was discussed regarding potential next steps.  PREVIOUS RADIATION THERAPY: {EXAM; YES/NO:19492::"No"}  PAST MEDICAL HISTORY:  Past Medical History:  Diagnosis Date   Allergic bronchopulmonary aspergillosis 2008   sees Dr Victoria [name] pulmonology   Anemia    iron deficiency, resolved   Anxiety    Asthma    CAD (coronary artery disease)    a. LHC 6/16:  oOM1 60, pRCA 25 >> med Rx b. cath 3/19 2nd OM with 95% stenosis s/p synergy DES & anomalous RCA   CAP (community acquired pneumonia) 2016; 06/07/2016   Chronic bronchitis    Chronic lower back pain    Complication of anesthesia    "think I have a hard time waking up from it"   COPD (chronic obstructive pulmonary disease)    Depression    mild   Diverticulitis    Diverticulosis    GERD (gastroesophageal reflux disease)    H/O hiatal hernia    Headache    "weekly" (08/23/2017)   History of echocardiogram    Echo 6/16:  Mod LVH, EF 60-65%, no RWMA, Gr 1 DD, trivial MR, normal LA size.   Hyperglycemia 11/20/2015   Hyperlipidemia, mixed 09/11/2007   Qualifier: Diagnosis of  By: Victoria [name]   Did not tolerate Lipitor, zocor, Lovastatin, Pravastatin, Livalo, Crestor even low dose    IBS (irritable bowel syndrome)    Maxillary  sinusitis    Normal cardiac stress test 11/2011   No evidence of ischemia or infarct.   Calculated ejection fraction 72%.   Obesity    OSA (obstructive sleep apnea) 02/2012   has stopped using  cpap   Osteoarthritis    Osteoporosis    Pneumonia 11/2011   "before 2013 I hadn't had pneumonia since I was a child" (04/13/2012)   Pulmonary nodules    S/P angioplasty with stent 08/23/17 ostial 2nd OM with DES synnergy 08/24/2017   Schatzki's ring     PAST SURGICAL HISTORY: Past Surgical History:  Procedure Laterality  Date   APPENDECTOMY  1989   CARDIAC CATHETERIZATION N/A 11/25/2014   Procedure: Right/Left Heart Cath and Coronary Angiography;  Surgeon: Victoria Records, MD;  Location: [facility] INVASIVE CV LAB;  Service: Cardiovascular;  Laterality: N/A;   CESAREAN SECTION  1985   COLONOSCOPY  03/2022   CORONARY ANGIOPLASTY WITH STENT PLACEMENT  08/23/2017   CORONARY STENT INTERVENTION N/A 08/23/2017   Procedure: CORONARY STENT INTERVENTION;  Surgeon: Victoria Hazel, MD;  Location: [facility] INVASIVE CV LAB;  Service: Cardiovascular;  Laterality: N/A;   HERNIA REPAIR  04/13/2012   VHR laparoscopic   LEFT HEART CATH AND CORONARY ANGIOGRAPHY N/A 08/23/2017   Procedure: LEFT HEART CATH AND CORONARY ANGIOGRAPHY;  Surgeon: Victoria Hazel, MD;  Location: [facility] INVASIVE CV LAB;  Service: Cardiovascular;  Laterality: N/A;   VENTRAL HERNIA REPAIR  04/13/2012   Procedure: LAPAROSCOPIC VENTRAL HERNIA;  Surgeon: Victoria Mention, MD;  Location: [facility] OR;  Service: General;  Laterality: N/A;  laparoscopic repair of incarcerated hernia    FAMILY HISTORY:  Family History  Problem Relation Age of Onset   Breast cancer Mother    Hypertension Mother    Diabetes Mother    Cancer Mother        recurrent, metastatic breast cancer.    Diverticulosis Father    Prostate cancer Father    Breast cancer Sister    Cancer Sister        breast cancer, invasive ductal carcinoma in 2022,DCIS at 73 with 4 weeks of radiation, 5 years of Tamoxifen    Pulmonary embolism Brother        recurrent   Heart attack Maternal Grandfather    Cerebral palsy Son    Cancer Niece 32   Osteoporosis Niece    Stroke Neg Hx    Colon cancer Neg Hx    Esophageal cancer Neg Hx    Stomach cancer Neg Hx    Rectal cancer Neg Hx     SOCIAL HISTORY:  Social History   Socioeconomic History   Marital status: Married    Spouse name: Not on file   Number of children: 1   Years of education: 14   Highest education level: Associate degree: academic  program  Occupational History   Occupation: Disabled   Tobacco Use   Smoking status: Never    Passive exposure: Never   Smokeless tobacco: Never  Vaping Use   Vaping Use: Never used  Substance and Sexual Activity   Alcohol use: Yes    Comment: social use- last drink 3 weeks ago   Drug use: No   Sexual activity: Yes    Comment: gluten free, lives with husband and son with CP quadriplegia  Other Topics Concern   Not on file  Social History Narrative   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    Lives at home with her husband and son.  Right-handed.   2 cups caffeine per day.   One story home   Social Determinants of Health   Financial Resource Strain: Low Risk  (10/19/2021)   Overall Financial Resource Strain (CARDIA)    Difficulty of Paying Living Expenses: Not hard at all  Food Insecurity: No Food Insecurity (08/19/2022)   Hunger Vital Sign    Worried About Running Out of Food in the Last Year: Never true    Ran Out of Food in the Last Year: Never true  Transportation Needs: No Transportation Needs (08/19/2022)   PRAPARE - Administrator, Civil Service (Medical): No    Lack of Transportation (Non-Medical): No  Physical Activity: Insufficiently Active (10/19/2021)   Exercise Vital Sign    Days of Exercise per Week: 7 days    Minutes of Exercise per Session: 20 min  Stress: Stress Concern Present (10/19/2021)   [employer] of Occupational Health - Occupational Stress Questionnaire    Feeling of Stress : Rather much  Social Connections: Moderately Integrated (10/19/2021)   Social Connection and Isolation Panel [NHANES]    Frequency of Communication with Friends and Family: More than three times a week    Frequency of Social Gatherings with Friends and Family: Once a week    Attends Religious Services: More than 4 times per year    Active Member of [employer] or Organizations: No    Attends Banker Meetings: Never    Marital Status: Married    ALLERGIES:   Allergies  Allergen Reactions   Latuda [Lurasidone Hcl] Other (See Comments)    PER THE PT CAUSED RESTLESSNESS   Beclomethasone Dipropionate Hives and Other (See Comments)     weight gain   Flexeril [Cyclobenzaprine] Anxiety   Mometasone Furo-Formoterol Fum Hives and Other (See Comments)    weight gain   Sulfonamide Derivatives Hives and Rash   Statins Other (See Comments)    Myalgias, RLS    MEDICATIONS:  Current Outpatient Medications  Medication Sig Dispense Refill   acetaminophen (TYLENOL) 650 MG CR tablet Take 650 mg by mouth every 8 (eight) hours as needed for pain.     albuterol (VENTOLIN HFA) 108 (90 Base) MCG/ACT inhaler Inhale 1-2 puffs into the lungs every 6 (six) hours as needed. 8 g 2   ascorbic acid (VITAMIN C) 500 MG tablet Take 500 mg by mouth daily.     aspirin EC 81 MG tablet Take 81 mg by mouth daily. Swallow whole.     Benralizumab (FASENRA PEN) 30 MG/ML SOAJ Inject 1 mL (30 mg total) into the skin every 8 (eight) weeks. 1 mL 2   budesonide (ENTOCORT EC) 3 MG 24 hr capsule Take 1 capsule (3 mg total) by mouth daily. 30 capsule 5   Calcium-Magnesium-Vitamin D (CALCIUM MAGNESIUM PO) Take 1 tablet by mouth at bedtime.     Cholecalciferol (VITAMIN D3 ULTRA STRENGTH) 125 MCG (5000 UT) capsule Take 5,000 Units by mouth daily.     [START ON 10/26/2022] clonazePAM (KLONOPIN) 0.5 MG tablet Take 1 tablet (0.5 mg total) by mouth daily. 30 tablet 0   denosumab (PROLIA) 60 MG/ML SOSY injection Inject 60 mg into the skin every 6 (six) months.     doxepin (SINEQUAN) 25 MG capsule Take 1 capsule (25 mg total) by mouth at bedtime. 30 capsule 3   Evolocumab (REPATHA SURECLICK) 140 MG/ML SOAJ Inject 140 mg into the skin every 21 ( twenty-one) days.     famotidine (PEPCID) 20 MG tablet Take  20 mg by mouth daily.     ibuprofen (ADVIL) 200 MG tablet Take 400 mg by mouth every 6 (six) hours as needed for moderate pain.     ipratropium-albuterol (DUONEB) 0.5-2.5 (3) MG/3ML SOLN Take 3 mLs  by nebulization every 6 (six) hours as needed. 120 mL 5   nitroGLYCERIN (NITROSTAT) 0.4 MG SL tablet Place 1 tablet (0.4 mg total) under the tongue every 5 (five) minutes as needed for chest pain. 25 tablet 11   pindolol (VISKEN) 5 MG tablet Take 1 tablet (5 mg total) by mouth daily after breakfast. 30 tablet 3   risperiDONE (RISPERDAL) 0.5 MG tablet Take 1 tablet (0.5 mg total) by mouth 2 (two) times daily at 8 am and 4 pm. 60 tablet 3   venlafaxine XR (EFFEXOR XR) 75 MG 24 hr capsule Take 3 capsules (225 mg total) by mouth daily with breakfast. 90 capsule 1   No current facility-administered medications for this encounter.    REVIEW OF SYSTEMS: A 10+ POINT REVIEW OF SYSTEMS WAS OBTAINED including neurology, dermatology, psychiatry, cardiac, respiratory, lymph, extremities, GI, GU, musculoskeletal, constitutional, reproductive, HEENT. On the provided form, she reports ***. She denies *** and any other symptoms.    PHYSICAL EXAM:  vitals were not taken for this visit.  {may need to copy over vitals} Lungs are clear to auscultation bilaterally. Heart has regular rate and rhythm. No palpable cervical, supraclavicular, or axillary adenopathy. Abdomen soft, non-tender, normal bowel sounds. Breast: *** breast with no palpable mass, nipple discharge, or bleeding. *** breast with ***.   KPS = ***  100 - Normal; no complaints; no evidence of disease. 90   - Able to carry on normal activity; minor signs or symptoms of disease. 80   - Normal activity with effort; some signs or symptoms of disease. 42   - Cares for self; unable to carry on normal activity or to do active work. 60   - Requires occasional assistance, but is able to care for most of his personal needs. 50   - Requires considerable assistance and frequent medical care. 40   - Disabled; requires special care and assistance. 30   - Severely disabled; hospital admission is indicated although death not imminent. 20   - Very sick; hospital  admission necessary; active supportive treatment necessary. 10   - Moribund; fatal processes progressing rapidly. 0     - Dead  Karnofsky DA, Abelmann WH, Craver LS and Burchenal [facility] [phone]) The use of the nitrogen mustards in the palliative treatment of carcinoma: with particular reference to bronchogenic carcinoma Cancer 1 634-56  LABORATORY DATA:  Lab Results  Component Value Date   WBC 8.1 08/12/2022   HGB 14.0 08/12/2022   HCT 40.8 08/12/2022   MCV 89.1 08/12/2022   PLT 314 08/12/2022   Lab Results  Component Value Date   NA 130 (L) 08/12/2022   K 3.4 (L) 08/12/2022   CL 96 (L) 08/12/2022   CO2 24 08/12/2022   Lab Results  Component Value Date   ALT 17 08/12/2022   AST 20 08/12/2022   ALKPHOS 72 08/12/2022   BILITOT 0.5 08/12/2022    PULMONARY FUNCTION TEST:   Review Flowsheet       Latest Ref Rng & Units 10/19/2017  Spirometry  FVC-%Pred-Pre % 86   FVC-%Pred-Post % 84   FEV1-Pre L 1.79   FEV1-%Pred-Pre % 75   FEV1-Post L 1.80   FEV1-%Pred-Post % 76   DLCO unc ml/min/mmHg 19.51  RADIOGRAPHY: MR LT BREAST BX W LOC DEV EA ADD LESION IMAGE BX SPEC MR GUIDE  Addendum Date: 10/11/2022   ADDENDUM REPORT: 10/11/2022 14:56 ADDENDUM: Pathology revealed BENIGN BREAST PARENCHYMA WITH USUAL DUCTAL HYPERPLASIA AND FIBROCYSTIC CHANGE of the LEFT breast, lower outer anterior, (cylinder clip). This was found to be concordant by Dr. Emmaline Kluver. Pathology revealed GRADE I INVASIVE DUCTAL CARCINOMA of the LEFT breast, lower outer posterior, (barbell clip). This was found to be concordant by Dr. Emmaline Kluver. Pathology results were discussed with the patient by telephone by Collene Mares, RN Nurse Navigator. The patient reported doing well after the biopsies with tenderness and bruising at the sites. Post biopsy instructions and care were reviewed and questions were answered. The patient was encouraged to call [facility] for any additional concerns.  The patient was referred to [facility] on October 12, 2022. Pathology results reported by Rene Kocher, RN on 10/10/2022. Electronically Signed   By: Emmaline Kluver M.D.   On: 10/11/2022 14:56   Result Date: 10/11/2022 CLINICAL DATA:  67 year old female presenting for biopsy of a mass and non mass enhancement in the outer left breast. EXAM: MRI GUIDED CORE NEEDLE BIOPSY OF THE LEFT BREAST x 2 TECHNIQUE: Multiplanar, multisequence MR imaging of the left breast was performed both before and after administration of intravenous contrast. CONTRAST:  7 mL Vueway COMPARISON:  Previous exam(s). FINDINGS: I met with the patient, and we discussed the procedure of MRI guided biopsy, including risks, benefits, and alternatives. Specifically, we discussed the risks of infection, bleeding, tissue injury, clip migration, and inadequate sampling. Informed, written consent was given. The usual time out protocol was performed immediately prior to the procedure. Using sterile technique, 1% Lidocaine, MRI guidance, and a 9 gauge vacuum assisted device, biopsy was performed of non mass enhancement in the lower outer anterior left breast using a lateral approach. At the conclusion of the procedure, a a cylinder tissue marker clip was deployed into the biopsy cavity. Follow-up 2-view mammogram was performed and dictated separately. Using sterile technique, 1% Lidocaine, MRI guidance, and a 9 gauge vacuum assisted device, biopsy was performed of a mass in the lower outer posterior left breast using a lateral approach. At the conclusion of the procedure, a a barbell tissue marker clip was deployed into the biopsy cavity. Follow-up 2-view mammogram was performed and dictated separately. IMPRESSION: MRI guided biopsy of non mass enhancement in the lower outer anterior left breast and of a mass in the lower outer posterior left breast. No apparent complications.  Electronically Signed: By: Emmaline Kluver M.D. On: 10/05/2022 10:42   MR LT BREAST BX W LOC DEV 1ST LESION IMAGE BX SPEC MR GUIDE  Addendum Date: 10/11/2022   ADDENDUM REPORT: 10/11/2022 14:56 ADDENDUM: Pathology revealed BENIGN BREAST PARENCHYMA WITH USUAL DUCTAL HYPERPLASIA AND FIBROCYSTIC CHANGE of the LEFT breast, lower outer anterior, (cylinder clip). This was found to be concordant by Dr. Emmaline Kluver. Pathology revealed GRADE I INVASIVE DUCTAL CARCINOMA of the LEFT breast, lower outer posterior, (barbell clip). This was found to be concordant by Dr. Emmaline Kluver. Pathology results were discussed with the patient by telephone by Collene Mares, RN Nurse Navigator. The patient reported doing well after the biopsies with tenderness and bruising at the sites. Post biopsy instructions and care were reviewed and questions were answered. The patient was encouraged to call [facility] for any additional concerns. The  patient was referred to [facility] at [facility] on October 12, 2022. Pathology results reported by Rene Kocher, RN on 10/10/2022. Electronically Signed   By: Emmaline Kluver M.D.   On: 10/11/2022 14:56   Result Date: 10/11/2022 CLINICAL DATA:  67 year old female presenting for biopsy of a mass and non mass enhancement in the outer left breast. EXAM: MRI GUIDED CORE NEEDLE BIOPSY OF THE LEFT BREAST x 2 TECHNIQUE: Multiplanar, multisequence MR imaging of the left breast was performed both before and after administration of intravenous contrast. CONTRAST:  7 mL Vueway COMPARISON:  Previous exam(s). FINDINGS: I met with the patient, and we discussed the procedure of MRI guided biopsy, including risks, benefits, and alternatives. Specifically, we discussed the risks of infection, bleeding, tissue injury, clip migration, and inadequate sampling. Informed, written consent was given. The usual time out  protocol was performed immediately prior to the procedure. Using sterile technique, 1% Lidocaine, MRI guidance, and a 9 gauge vacuum assisted device, biopsy was performed of non mass enhancement in the lower outer anterior left breast using a lateral approach. At the conclusion of the procedure, a a cylinder tissue marker clip was deployed into the biopsy cavity. Follow-up 2-view mammogram was performed and dictated separately. Using sterile technique, 1% Lidocaine, MRI guidance, and a 9 gauge vacuum assisted device, biopsy was performed of a mass in the lower outer posterior left breast using a lateral approach. At the conclusion of the procedure, a a barbell tissue marker clip was deployed into the biopsy cavity. Follow-up 2-view mammogram was performed and dictated separately. IMPRESSION: MRI guided biopsy of non mass enhancement in the lower outer anterior left breast and of a mass in the lower outer posterior left breast. No apparent complications. Electronically Signed: By: Emmaline Kluver M.D. On: 10/05/2022 10:42   MM CLIP PLACEMENT LEFT  Result Date: 10/05/2022 CLINICAL DATA:  Post procedure mammogram for clip placement EXAM: 3D DIAGNOSTIC LEFT MAMMOGRAM POST MRI BIOPSY COMPARISON:  Previous exam(s). FINDINGS: 3D Mammographic images were obtained following MRI guided biopsy of non mass enhancement in the lower outer left breast anterior. The cylinder biopsy marking clip is in expected position at the site of biopsy. 3D Mammographic images were obtained following MRI guided biopsy of a mass in the lower outer left breast posterior. The barbell biopsy marking clip is in expected position at the site of biopsy. IMPRESSION: Appropriate positioning of the cylinder shaped biopsy marking clip at the site of biopsy in the lower outer left breast anterior. Appropriate positioning of the barbell shaped biopsy marking clip at the site of biopsy in the lower outer left breast posterior. Final Assessment: Post  Procedure Mammograms for Marker Placement Electronically Signed   By: Emmaline Kluver M.D.   On: 10/05/2022 10:44  MYOCARDIAL PERFUSION IMAGING  Result Date: 09/22/2022   The study is normal. The study is low risk.   No ST deviation was noted.   Left ventricular function is normal. Nuclear stress EF: 73 %. The left ventricular ejection fraction is hyperdynamic (>65%). End diastolic cavity size is normal.   Prior study available for comparison from 03/15/2019.   DG INJECT DIAG/THERA/INC NEEDLE/CATH/PLC EPI/LUMB/SAC W/IMG  Result Date: 09/14/2022 CLINICAL DATA:  Thoracolumbar region back pain, worse on the left. Good response to previous injection done at T12-L1 FLUOROSCOPY: Radiation Exposure Index (as provided by the fluoroscopic device): 0 minutes 59 seconds 52.31 micro gray meter squared PROCEDURE: THORACIC EPIDURAL INJECTION An interlaminar approach was performed  on the left at T12-L1. A 20 gauge epidural needle was advanced using loss-of-resistance technique. DIAGNOSTIC EPIDURAL INJECTION Injection of Isovue-M 300 shows a good epidural pattern with spread above and below the level of needle placement, spreading to both sides. No vascular opacification is seen. THERAPEUTIC EPIDURAL INJECTION 1.5 ml of Kenalog 40 mixed with 1 ml of 1% Lidocaine and 2 ml of normal saline were then instilled. The procedure was well-tolerated, and the patient was discharged thirty minutes following the injection in good condition. IMPRESSION: Technically successful repeat epidural injection on the left at T12-L1. Electronically Signed   By: Paulina Fusi M.D.   On: 09/14/2022 12:14   MR BREAST BILATERAL W WO CONTRAST INC CAD  Addendum Date: 09/13/2022   ADDENDUM REPORT: 09/13/2022 14:58 ADDENDUM: Correction needs to be made to the body of the report - there is a left/right discrepancy. The findings section should read as follows: Right breast: No mass or abnormal enhancement. Left breast: In the lower outer quadrant of the  left breast in the mid to posterior depth (series 6, image 93), there is a 5 mm round mass with persistent kinetics. Approximately 1 cm anterior to this mass, is a 7 mm area of non mass enhancement (series 6, image 93). No other masses or abnormal enhancement is found in the left breast. The impression and recommendation are correct. Electronically Signed   By: Frederico Hamman M.D.   On: 09/13/2022 14:58   Result Date: 09/13/2022 CLINICAL DATA:  67 year old female presenting for high risk screening MRI due to strong family history of breast cancer including her mother at ages 35 and 24, sister at ages 45 and 1 and a niece at age 22. The patient is asymptomatic. EXAM: BILATERAL BREAST MRI WITH AND WITHOUT CONTRAST TECHNIQUE: Multiplanar, multisequence MR images of both breasts were obtained prior to and following the intravenous administration of 7 ml of Vueway Three-dimensional MR images were rendered by post-processing of the original MR data on an independent workstation. The three-dimensional MR images were interpreted, and findings are reported in the following complete MRI report for this study. Three dimensional images were evaluated at the independent interpreting workstation using the DynaCAD thin client. COMPARISON:  None available. FINDINGS: Breast composition: b. Scattered fibroglandular tissue. Background parenchymal enhancement: Minimal Right breast: In the lower outer quadrant of the left breast in the mid to posterior depth (series 6, image 93), there is a 5 mm round mass with persistent kinetics. Approximately 1 cm anterior to this mass, is a 7 mm area of non mass enhancement (series 6, image 93). No other masses or abnormal enhancement is found in the left breast. Left breast: No mass or abnormal enhancement. Lymph nodes: No abnormal appearing lymph nodes. Ancillary findings:  None. IMPRESSION: 1.  There is a 5 mm round mass in the lower outer left breast. 2. There is a 7 mm area of non mass  enhancement in the lower outer left breast. This is approximately 1 cm anterior to the 5 mm round mass. 3.  No MRI evidence of malignancy in the right breast. RECOMMENDATION: MRI guided biopsy is recommended for the 2 adjacent areas in the left breast (both seen in series 6, image 93). BI-RADS CATEGORY  4: Suspicious. Electronically Signed: By: Frederico Hamman M.D. On: 09/12/2022 13:33     IMPRESSION: ***  Patient will be a good candidate for breast conservation with radiotherapy to the left breast. We discussed the general course of radiation, potential side effects, and toxicities  with radiation and the patient is interested in this approach. ***   PLAN:  ***    ------------------------------------------------  Blair Promise, PhD, MD  This document serves as a record of services personally performed by Gery Pray, MD. It was created on his behalf by Roney Mans, a trained medical scribe. The creation of this record is based on the scribe's personal observations and the provider's statements to them. This document has been checked and approved by the attending provider.

## 2022-10-12 ENCOUNTER — Ambulatory Visit: Payer: PPO | Admitting: Physical Therapy

## 2022-10-12 ENCOUNTER — Encounter: Payer: Self-pay | Admitting: Genetic Counselor

## 2022-10-12 ENCOUNTER — Inpatient Hospital Stay: Payer: PPO

## 2022-10-12 ENCOUNTER — Other Ambulatory Visit: Payer: Self-pay

## 2022-10-12 ENCOUNTER — Encounter: Payer: Self-pay | Admitting: Hematology

## 2022-10-12 ENCOUNTER — Inpatient Hospital Stay: Payer: PPO | Attending: Hematology and Oncology | Admitting: Hematology

## 2022-10-12 ENCOUNTER — Ambulatory Visit: Payer: PPO | Admitting: Radiation Oncology

## 2022-10-12 ENCOUNTER — Ambulatory Visit: Payer: PPO | Admitting: Hematology and Oncology

## 2022-10-12 ENCOUNTER — Ambulatory Visit: Payer: Self-pay | Admitting: General Surgery

## 2022-10-12 ENCOUNTER — Other Ambulatory Visit: Payer: PPO

## 2022-10-12 ENCOUNTER — Ambulatory Visit
Admission: RE | Admit: 2022-10-12 | Discharge: 2022-10-12 | Disposition: A | Discharge status: Home / Self Care | Payer: PPO | Source: Ambulatory Visit | Attending: Radiation Oncology | Admitting: Radiation Oncology

## 2022-10-12 VITALS — BP 106/59 | HR 68 | Temp 97.9°F | Resp 18 | Ht 61.0 in | Wt 159.8 lb

## 2022-10-12 DIAGNOSIS — F32A Depression, unspecified: Secondary | ICD-10-CM | POA: Diagnosis not present

## 2022-10-12 DIAGNOSIS — F419 Anxiety disorder, unspecified: Secondary | ICD-10-CM | POA: Insufficient documentation

## 2022-10-12 DIAGNOSIS — D649 Anemia, unspecified: Secondary | ICD-10-CM | POA: Diagnosis not present

## 2022-10-12 DIAGNOSIS — C50512 Malignant neoplasm of lower-outer quadrant of left female breast: Secondary | ICD-10-CM | POA: Insufficient documentation

## 2022-10-12 DIAGNOSIS — M810 Age-related osteoporosis without current pathological fracture: Secondary | ICD-10-CM | POA: Insufficient documentation

## 2022-10-12 DIAGNOSIS — G4733 Obstructive sleep apnea (adult) (pediatric): Secondary | ICD-10-CM | POA: Diagnosis not present

## 2022-10-12 DIAGNOSIS — E785 Hyperlipidemia, unspecified: Secondary | ICD-10-CM | POA: Insufficient documentation

## 2022-10-12 DIAGNOSIS — Z1379 Encounter for other screening for genetic and chromosomal anomalies: Secondary | ICD-10-CM | POA: Insufficient documentation

## 2022-10-12 DIAGNOSIS — Z7952 Long term (current) use of systemic steroids: Secondary | ICD-10-CM | POA: Insufficient documentation

## 2022-10-12 DIAGNOSIS — I2510 Atherosclerotic heart disease of native coronary artery without angina pectoris: Secondary | ICD-10-CM | POA: Diagnosis not present

## 2022-10-12 DIAGNOSIS — Z170 Estrogen receptor positive status [ER+]: Secondary | ICD-10-CM | POA: Insufficient documentation

## 2022-10-12 DIAGNOSIS — J4489 Other specified chronic obstructive pulmonary disease: Secondary | ICD-10-CM | POA: Diagnosis not present

## 2022-10-12 DIAGNOSIS — Z7982 Long term (current) use of aspirin: Secondary | ICD-10-CM | POA: Insufficient documentation

## 2022-10-12 DIAGNOSIS — Z1589 Genetic susceptibility to other disease: Secondary | ICD-10-CM | POA: Insufficient documentation

## 2022-10-12 DIAGNOSIS — Z90710 Acquired absence of both cervix and uterus: Secondary | ICD-10-CM | POA: Diagnosis not present

## 2022-10-12 DIAGNOSIS — Z79899 Other long term (current) drug therapy: Secondary | ICD-10-CM | POA: Insufficient documentation

## 2022-10-12 LAB — CMP (CANCER CENTER ONLY)
ALT: 16 U/L (ref 0–44)
AST: 15 U/L (ref 15–41)
Albumin: 4 g/dL (ref 3.5–5.0)
Alkaline Phosphatase: 63 U/L (ref 38–126)
Anion gap: 4 — ABNORMAL LOW (ref 5–15)
BUN: 14 mg/dL (ref 8–23)
CO2: 31 mmol/L (ref 22–32)
Calcium: 9.5 mg/dL (ref 8.9–10.3)
Chloride: 101 mmol/L (ref 98–111)
Creatinine: 0.68 mg/dL (ref 0.44–1.00)
GFR, Estimated: >60 mL/min (ref >60)
Glucose, Bld: 103 mg/dL — ABNORMAL HIGH (ref 70–99)
Potassium: 4.5 mmol/L (ref 3.5–5.1)
Sodium: 136 mmol/L (ref 135–145)
Total Bilirubin: 0.3 mg/dL (ref 0.3–1.2)
Total Protein: 6.4 g/dL — ABNORMAL LOW (ref 6.5–8.1)

## 2022-10-12 LAB — CBC WITH DIFFERENTIAL (CANCER CENTER ONLY)
Abs Immature Granulocytes: 0.03 10*3/uL (ref 0.00–0.07)
Basophils Absolute: 0 10*3/uL (ref 0.0–0.1)
Basophils Relative: 0 %
Eosinophils Absolute: 0 10*3/uL (ref 0.0–0.5)
Eosinophils Relative: 0 %
HCT: 37.9 % (ref 36.0–46.0)
Hemoglobin: 12.6 g/dL (ref 12.0–15.0)
Immature Granulocytes: 1 %
Lymphocytes Relative: 29 %
Lymphs Abs: 1.4 10*3/uL (ref 0.7–4.0)
MCH: 30 pg (ref 26.0–34.0)
MCHC: 33.2 g/dL (ref 30.0–36.0)
MCV: 90.2 fL (ref 80.0–100.0)
Monocytes Absolute: 0.5 10*3/uL (ref 0.1–1.0)
Monocytes Relative: 10 %
Neutro Abs: 2.9 10*3/uL (ref 1.7–7.7)
Neutrophils Relative %: 60 %
Platelet Count: 245 10*3/uL (ref 150–400)
RBC: 4.2 MIL/uL (ref 3.87–5.11)
RDW: 13.3 % (ref 11.5–15.5)
WBC Count: 4.8 10*3/uL (ref 4.0–10.5)
nRBC: 0 % (ref 0.0–0.2)

## 2022-10-12 LAB — GENETIC SCREENING ORDER

## 2022-10-12 MED ORDER — KETOROLAC TROMETHAMINE 15 MG/ML IJ SOLN
15.0000 mg | Freq: Once | INTRAMUSCULAR | Status: AC
Start: 2022-10-12 — End: 2022-10-17

## 2022-10-12 NOTE — Progress Notes (Signed)
[facility]   [phone]) [phone] Fax:([phone]) [phone]   Clinic New Consult Note   Patient Care Team: Bradd Canary, MD as PCP - General (Family Medicine) Meriam Sprague, MD as PCP - Cardiology (Cardiology) Haverstock, Elvin So, MD as Referring Physician (Dermatology) Kalman Shan, MD as Consulting Physician (Pulmonary Disease) Lars Masson, MD as Consulting Physician (Cardiology) Marcelle Overlie, MD as Consulting Physician (Obstetrics and Gynecology) Suzanna Obey, MD as Consulting Physician (Otolaryngology) Pyrtle, Carie Caddy, MD as Consulting Physician (Gastroenterology) Donnelly Angelica, RN as Oncology Nurse Navigator Pershing Proud, RN as Oncology Nurse Navigator Griselda Miner, MD as Consulting Physician (General Surgery) Malachy Mood, MD as Consulting Physician (Hematology) Antony Blackbird, MD as Consulting Physician (Radiation Oncology)  Date of Service:  10/12/2022   CHIEF COMPLAINTS/PURPOSE OF CONSULTATION:  Left Breast Cancer, ER positive   REFERRING PHYSICIAN:   [facility]   ASSESSMENT & PLAN:  Victoria [name] is a 67 y.o. post-menopausal female with a history of   1.  Malignant neoplasm of lower outer quadrant of left breast, invasive ductal carcinoma, stage Ia, cT1aN0M0, ER+/PR+/HER2-, G1 --We discussed her imaging findings and the biopsy results in great details. -Giving the very early disease, she likely a candidate for lumpectomy. She is agreeable with that. She was seen by Dr. Carolynne Edouard today and likely will proceed with surgery soon.  Given her age and small primary tumor, it is reasonable to forego sentinel lymph node biopsy. -Given the small tumor, no Oncotype or adjuvant chemotherapy needed.  Given the favorable prognostic factors, this is likely low risk disease. -Giving the strong ER and PR expression in her postmenopausal status, I recommend adjuvant endocrine therapy with aromatase inhibitor or tamoxifen for a total of 5-10  years to reduce the risk of cancer recurrence. Potential benefits and side effects were discussed with patient and she is interested. -She had a history of hysterectomy, she is on Prolia injection for osteoporosis.  I think tamoxifen would be a better option for her -She was also seen by radiation oncologist Dr. Roselind Messier today. If her surgical sentinel lymph nodes were negative, she would not need post mastectomy radiation.  -We also discussed the breast cancer surveillance after her surgery. She will continue annual screening mammogram, self exam, and a routine office visit with lab and exam with [name]. -I encouraged her to have healthy diet and exercise regularly.    2.  Osteoporosis -She is on Prolia injection every 6 months, and vitamin D. -We discussed the impact of antiestrogen therapy on her bone density, she will repeat a bone density every 2 years    PLAN:  -I reviewed her imaging and biopsy results, she will proceed left breast lumpectomy with Dr. Cheral Bay in the near future -dIscuss antiestrogen therapy options, I recommend tamoxifen, started after adjuvant radiation. -will see pt back after radiation  Oncology History Overview Note   Cancer Staging  Primary malignant neoplasm of lower-outer quadrant of breast, left Staging form: Breast, AJCC 8th Edition - Clinical stage from 10/10/2022: cT1a, cN0, cM0, G1 - Unsigned Stage prefix: Initial diagnosis Method of lymph node assessment: Clinical Histologic grading system: 3 grade system     Primary malignant neoplasm of lower-outer quadrant of breast, left  10/10/2022 Initial Diagnosis   Primary malignant neoplasm of lower-outer quadrant of breast, left      HISTORY OF PRESENTING ILLNESS:  Victoria [name] 67 y.o. female is a here because of breast cancer. The [facility]. The patient presents to the clinic today accompanied by by husband..   She had routine screening mammography on 09/13/2022. showing a  possible abnormality in the left breast. She underwent  MR bilateral W WO contrast on 03/25/204 showing: .Left breast: In the lower outer quadrant of the left breast in the mid to posterior depth (series 6, image 93), there is a 5 mm round mass with persistent kinetics. Approximately 1 cm anterior to this mass, is a 7 mm area of non mass enhancement (series 6, image 93). No other masses or abnormal enhancement is found in the left breast.    Biopsy on 10/07/2022 showed: Marland Kitchen Prognostic indicators significant for: estrogen receptor, 100% positive  and progesterone receptor, 100%{positive . Proliferation marker Ki67 at 5%. HER2 negative.    Today the patient notes they felt/feeling prior/after.Marland KitchenMarland KitchenPt has discomfort  lower back. Pt denied having any issue.   She has a PMHx of.... Anemia Anxiety Arthirts Asthma COPD Depression GERD Osteoporosis Acid Reflux C-Section   Socially... Married 1 child  GYN HISTORY  Menarchal: 13-15 yr LMP: 51 yr. Contraceptive:yes HRT: no GP:G1,P1    REVIEW OF SYSTEMS:    Constitutional: Denies fevers, chills or abnormal night sweats Eyes: Denies blurriness of vision, double vision or watery eyes Ears, nose, mouth, throat, and face: Denies mucositis or sore throat Respiratory: Denies cough, dyspnea or wheezes Cardiovascular: Denies palpitation, chest discomfort or lower extremity swelling Gastrointestinal:  Denies nausea, heartburn or change in bowel habits Skin: Denies abnormal skin rashes Lymphatics: Denies new lymphadenopathy or easy bruising Neurological:(-) Denies numbness, tingling or new weaknesses, Lower back pain. Behavioral/Psych: Depression/Anxiety Mood is stable, no new changes  All other systems were reviewed with the patient and are negative.   MEDICAL HISTORY:  Past Medical History:  Diagnosis Date   Allergic bronchopulmonary aspergillosis 2008   sees Dr Wenda Overland pulmonology   Anemia    iron deficiency, resolved    Anxiety    Asthma    Breast cancer    CAD (coronary artery disease)    a. LHC 6/16:  oOM1 60, pRCA 25 >> med Rx b. cath 3/19 2nd OM with 95% stenosis s/p synergy DES & anomalous RCA   CAP (community acquired pneumonia) 2016; 06/07/2016   Chronic bronchitis    Chronic lower back pain    Complication of anesthesia    "think I have a hard time waking up from it"   COPD (chronic obstructive pulmonary disease)    Depression    mild   Diverticulitis    Diverticulosis    GERD (gastroesophageal reflux disease)    H/O hiatal hernia    Headache    "weekly" (08/23/2017)   History of echocardiogram    Echo 6/16:  Mod LVH, EF 60-65%, no RWMA, Gr 1 DD, trivial MR, normal LA size.   Hyperglycemia 11/20/2015   Hyperlipidemia, mixed 09/11/2007   Qualifier: Diagnosis of  By: Janit Bern   Did not tolerate Lipitor, zocor, Lovastatin, Pravastatin, Livalo, Crestor even low dose    IBS (irritable bowel syndrome)    Maxillary sinusitis    Normal cardiac stress test 11/2011   No evidence of ischemia or infarct.   Calculated ejection fraction 72%.   Obesity    OSA (obstructive sleep apnea) 02/2012   has stopped using  cpap   Osteoarthritis    Osteoporosis    Pneumonia 11/2011   "before 2013 I hadn't had pneumonia since I was a child" (04/13/2012)   Pulmonary  nodules    S/P angioplasty with stent 08/23/17 ostial 2nd OM with DES synnergy 08/24/2017   Schatzki's ring     SURGICAL HISTORY: Past Surgical History:  Procedure Laterality Date   APPENDECTOMY  1989   CARDIAC CATHETERIZATION N/A 11/25/2014   Procedure: Right/Left Heart Cath and Coronary Angiography;  Surgeon: Lyn Records, MD;  Location: [facility] INVASIVE CV LAB;  Service: Cardiovascular;  Laterality: N/A;   CESAREAN SECTION  1985   COLONOSCOPY  03/2022   CORONARY ANGIOPLASTY WITH STENT PLACEMENT  08/23/2017   CORONARY STENT INTERVENTION N/A 08/23/2017   Procedure: CORONARY STENT INTERVENTION;  Surgeon: Kathleene Hazel, MD;   Location: [facility] INVASIVE CV LAB;  Service: Cardiovascular;  Laterality: N/A;   HERNIA REPAIR  04/13/2012   VHR laparoscopic   LEFT HEART CATH AND CORONARY ANGIOGRAPHY N/A 08/23/2017   Procedure: LEFT HEART CATH AND CORONARY ANGIOGRAPHY;  Surgeon: Kathleene Hazel, MD;  Location: [facility] INVASIVE CV LAB;  Service: Cardiovascular;  Laterality: N/A;   VENTRAL HERNIA REPAIR  04/13/2012   Procedure: LAPAROSCOPIC VENTRAL HERNIA;  Surgeon: [name], MD;  Location: [facility] OR;  Service: General;  Laterality: N/A;  laparoscopic repair of incarcerated hernia    SOCIAL HISTORY: Social History   Socioeconomic History   Marital status: Married    Spouse name: Not on file   Number of children: 1   Years of education: 14   Highest education level: Associate degree: academic program  Occupational History   Occupation: Disabled   Tobacco Use   Smoking status: Never    Passive exposure: Never   Smokeless tobacco: Never  Vaping Use   Vaping Use: Never used  Substance and Sexual Activity   Alcohol use: Yes    Comment: social use- last drink 3 weeks ago   Drug use: No   Sexual activity: Yes    Comment: gluten free, lives with husband and son with CP quadriplegia  Other Topics Concern   Not on file  Social History Narrative   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    Lives at home with her husband and son.   Right-handed.   2 cups caffeine per day.   One story home   Social Determinants of Health   Financial Resource Strain: Low Risk  (10/19/2021)   Overall Financial Resource Strain (CARDIA)    Difficulty of Paying Living Expenses: Not hard at all  Food Insecurity: No Food Insecurity (08/19/2022)   Hunger Vital Sign    Worried About Running Out of Food in the Last Year: Never true    Ran Out of Food in the Last Year: Never true  Transportation Needs: No Transportation Needs (08/19/2022)   PRAPARE - Administrator, Civil Service (Medical): No    Lack of Transportation (Non-Medical):  No  Physical Activity: Insufficiently Active (10/19/2021)   Exercise Vital Sign    Days of Exercise per Week: 7 days    Minutes of Exercise per Session: 20 min  Stress: Stress Concern Present (10/19/2021)   [employer] of Occupational Health - Occupational Stress Questionnaire    Feeling of Stress : Rather much  Social Connections: Moderately Integrated (10/19/2021)   Social Connection and Isolation Panel [NHANES]    Frequency of Communication with Friends and Family: More than three times a week    Frequency of Social Gatherings with Friends and Family: Once a week    Attends Religious Services: More than 4 times per year    Active Member of  Clubs or Organizations: No    Attends Banker Meetings: Never    Marital Status: Married  Catering manager Violence: Not At Risk (08/12/2022)   Humiliation, Afraid, Rape, and Kick questionnaire    Fear of Current or Ex-Partner: No    Emotionally Abused: No    Physically Abused: No    Sexually Abused: No    FAMILY HISTORY: Family History  Problem Relation Age of Onset   Breast cancer Mother 78       bilateral breast cancer dx. 23, met to liver   Hypertension Mother    Diabetes Mother    Diverticulosis Father    Prostate cancer Father    Breast cancer Sister 2       DCIS at 87, IDC at 78, PALB2+   Cancer Sister        breast cancer, invasive ductal carcinoma in 2022,DCIS at 12 with 4 weeks of radiation, 5 years of Tamoxifen    Pulmonary embolism Brother        recurrent   Heart attack Maternal Grandfather    Ovarian cancer Paternal Grandmother    Cerebral palsy Son    Prostate cancer Paternal Uncle    Breast cancer Niece 74       PALB2+   Osteoporosis Niece    Stroke Neg Hx    Colon cancer Neg Hx    Esophageal cancer Neg Hx    Stomach cancer Neg Hx    Rectal cancer Neg Hx     ALLERGIES:  is allergic to latuda [lurasidone hcl], beclomethasone dipropionate, flexeril [cyclobenzaprine], mometasone furo-formoterol fum,  sulfonamide derivatives, and statins.  MEDICATIONS:  Current Outpatient Medications  Medication Sig Dispense Refill   acetaminophen (TYLENOL) 650 MG CR tablet Take 650 mg by mouth every 8 (eight) hours as needed for pain.     albuterol (VENTOLIN HFA) 108 (90 Base) MCG/ACT inhaler Inhale 1-2 puffs into the lungs every 6 (six) hours as needed. 8 g 2   ascorbic acid (VITAMIN C) 500 MG tablet Take 500 mg by mouth daily.     aspirin EC 81 MG tablet Take 81 mg by mouth daily. Swallow whole.     Benralizumab (FASENRA PEN) 30 MG/ML SOAJ Inject 1 mL (30 mg total) into the skin every 8 (eight) weeks. 1 mL 2   budesonide (ENTOCORT EC) 3 MG 24 hr capsule Take 1 capsule (3 mg total) by mouth daily. 30 capsule 5   Calcium-Magnesium-Vitamin D (CALCIUM MAGNESIUM PO) Take 1 tablet by mouth at bedtime.     Cholecalciferol (VITAMIN D3 ULTRA STRENGTH) 125 MCG (5000 UT) capsule Take 5,000 Units by mouth daily.     [START ON 10/26/2022] clonazePAM (KLONOPIN) 0.5 MG tablet Take 1 tablet (0.5 mg total) by mouth daily. 30 tablet 0   denosumab (PROLIA) 60 MG/ML SOSY injection Inject 60 mg into the skin every 6 (six) months.     doxepin (SINEQUAN) 25 MG capsule Take 1 capsule (25 mg total) by mouth at bedtime. 30 capsule 3   Evolocumab (REPATHA SURECLICK) 140 MG/ML SOAJ Inject 140 mg into the skin every 21 ( twenty-one) days.     famotidine (PEPCID) 20 MG tablet Take 20 mg by mouth daily.     ibuprofen (ADVIL) 200 MG tablet Take 400 mg by mouth every 6 (six) hours as needed for moderate pain.     ipratropium-albuterol (DUONEB) 0.5-2.5 (3) MG/3ML SOLN Take 3 mLs by nebulization every 6 (six) hours as needed. 120 mL 5  nitroGLYCERIN (NITROSTAT) 0.4 MG SL tablet Place 1 tablet (0.4 mg total) under the tongue every 5 (five) minutes as needed for chest pain. 25 tablet 11   pindolol (VISKEN) 5 MG tablet Take 1 tablet (5 mg total) by mouth daily after breakfast. 30 tablet 3   risperiDONE (RISPERDAL) 0.5 MG tablet Take 1 tablet  (0.5 mg total) by mouth 2 (two) times daily at 8 am and 4 pm. 60 tablet 3   venlafaxine XR (EFFEXOR XR) 75 MG 24 hr capsule Take 3 capsules (225 mg total) by mouth daily with breakfast. 90 capsule 1   No current facility-administered medications for this visit.   Facility-Administered Medications Ordered in Other Visits  Medication Dose Route Frequency Provider Last Rate Last Admin   ketorolac (TORADOL) 15 MG/ML injection 15 mg  15 mg Intravenous Once [name], MD        PHYSICAL EXAMINATION: ECOG PERFORMANCE STATUS: 0 - Asymptomatic  Vitals:   10/12/22 1235  BP: (!) 106/59  Pulse: 68  Resp: 18  Temp: 97.9 F (36.6 C)  SpO2: 100%   Filed Weights   10/12/22 1235  Weight: 72.5 kg     GENERAL:alert, no distress and comfortable SKIN: skin color normal, no rashes or significant lesions EYES: normal, Conjunctiva are pink and non-injected, sclera clear  NEURO: alert & oriented x 3 with fluent speech NECK:(-) supple, thyroid normal size, non-tender, without nodularity LYMPH: (-) no palpable lymphadenopathy in the cervical, axillary  BREAST: rt breast no palpable mass, LT breast bruise from biopsy no palpable mass, breast exam benign. LABORATORY DATA:  I have reviewed the data as listed    Latest Ref Rng & Units 10/12/2022   12:13 PM 08/12/2022    3:00 PM 08/02/2022   11:59 AM  CBC  WBC 4.0 - 10.5 K/uL 4.8  8.1  10.9   Hemoglobin 12.0 - 15.0 g/dL 16.1  09.6  04.5   Hematocrit 36.0 - 46.0 % 37.9  40.8  41.2   Platelets 150 - 400 K/uL 245  314  300        Latest Ref Rng & Units 10/12/2022   12:13 PM 08/12/2022    3:00 PM 08/02/2022   11:59 AM  CMP  Glucose 70 - 99 mg/dL 409  811  914   BUN 8 - 23 mg/dL 14  12  11    Creatinine 0.44 - 1.00 mg/dL 7.82  9.56  2.13   Sodium 135 - 145 mmol/L 136  130  133   Potassium 3.5 - 5.1 mmol/L 4.5  3.4  3.8   Chloride 98 - 111 mmol/L 101  96  99   CO2 22 - 32 mmol/L 31  24  24    Calcium 8.9 - 10.3 mg/dL 9.5  9.1  9.0   Total  Protein 6.5 - 8.1 g/dL 6.4  6.7    Total Bilirubin 0.3 - 1.2 mg/dL 0.3  0.5    Alkaline Phos 38 - 126 U/L 63  72    AST 15 - 41 U/L 15  20    ALT 0 - 44 U/L 16  17       RADIOGRAPHIC STUDIES: I have personally reviewed the radiological images as listed and agreed with the findings in the report. MR LT BREAST BX W LOC DEV EA ADD LESION IMAGE BX SPEC MR GUIDE  Addendum Date: 10/11/2022   ADDENDUM REPORT: 10/11/2022 14:56 ADDENDUM: Pathology revealed BENIGN BREAST PARENCHYMA WITH USUAL DUCTAL HYPERPLASIA AND FIBROCYSTIC CHANGE  of the LEFT breast, lower outer anterior, (cylinder clip). This was found to be concordant by Dr. Emmaline Kluver. Pathology revealed GRADE I INVASIVE DUCTAL CARCINOMA of the LEFT breast, lower outer posterior, (barbell clip). This was found to be concordant by Dr. Emmaline Kluver. Pathology results were discussed with the patient by telephone by Collene Mares, RN Nurse Navigator. The patient reported doing well after the biopsies with tenderness and bruising at the sites. Post biopsy instructions and care were reviewed and questions were answered. The patient was encouraged to call [facility] for any additional concerns. The patient was referred to [facility] on October 12, 2022. Pathology results reported by Rene Kocher, RN on 10/10/2022. Electronically Signed   By: Emmaline Kluver M.D.   On: 10/11/2022 14:56   Result Date: 10/11/2022 CLINICAL DATA:  67 year old female presenting for biopsy of a mass and non mass enhancement in the outer left breast. EXAM: MRI GUIDED CORE NEEDLE BIOPSY OF THE LEFT BREAST x 2 TECHNIQUE: Multiplanar, multisequence MR imaging of the left breast was performed both before and after administration of intravenous contrast. CONTRAST:  7 mL Vueway COMPARISON:  Previous exam(s). FINDINGS: I met with the patient, and we discussed the procedure of MRI  guided biopsy, including risks, benefits, and alternatives. Specifically, we discussed the risks of infection, bleeding, tissue injury, clip migration, and inadequate sampling. Informed, written consent was given. The usual time out protocol was performed immediately prior to the procedure. Using sterile technique, 1% Lidocaine, MRI guidance, and a 9 gauge vacuum assisted device, biopsy was performed of non mass enhancement in the lower outer anterior left breast using a lateral approach. At the conclusion of the procedure, a a cylinder tissue marker clip was deployed into the biopsy cavity. Follow-up 2-view mammogram was performed and dictated separately. Using sterile technique, 1% Lidocaine, MRI guidance, and a 9 gauge vacuum assisted device, biopsy was performed of a mass in the lower outer posterior left breast using a lateral approach. At the conclusion of the procedure, a a barbell tissue marker clip was deployed into the biopsy cavity. Follow-up 2-view mammogram was performed and dictated separately. IMPRESSION: MRI guided biopsy of non mass enhancement in the lower outer anterior left breast and of a mass in the lower outer posterior left breast. No apparent complications. Electronically Signed: By: Emmaline Kluver M.D. On: 10/05/2022 10:42  MR LT BREAST BX W LOC DEV 1ST LESION IMAGE BX SPEC MR GUIDE  Addendum Date: 10/11/2022   ADDENDUM REPORT: 10/11/2022 14:56 ADDENDUM: Pathology revealed BENIGN BREAST PARENCHYMA WITH USUAL DUCTAL HYPERPLASIA AND FIBROCYSTIC CHANGE of the LEFT breast, lower outer anterior, (cylinder clip). This was found to be concordant by Dr. Emmaline Kluver. Pathology revealed GRADE I INVASIVE DUCTAL CARCINOMA of the LEFT breast, lower outer posterior, (barbell clip). This was found to be concordant by Dr. Emmaline Kluver. Pathology results were discussed with the patient by telephone by Collene Mares, RN Nurse Navigator. The patient reported doing well after the biopsies with  tenderness and bruising at the sites. Post biopsy instructions and care were reviewed and questions were answered. The patient was encouraged to call [facility] for any additional concerns. The patient was [facility] to [facility] on October 12, 2022. Pathology results reported by Rene Kocher, RN on 10/10/2022. Electronically Signed   By: Adline Potter.D.  On: 10/11/2022 14:56   Result Date: 10/11/2022 CLINICAL DATA:  67 year old female presenting for biopsy of a mass and non mass enhancement in the outer left breast. EXAM: MRI GUIDED CORE NEEDLE BIOPSY OF THE LEFT BREAST x 2 TECHNIQUE: Multiplanar, multisequence MR imaging of the left breast was performed both before and after administration of intravenous contrast. CONTRAST:  7 mL Vueway COMPARISON:  Previous exam(s). FINDINGS: I met with the patient, and we discussed the procedure of MRI guided biopsy, including risks, benefits, and alternatives. Specifically, we discussed the risks of infection, bleeding, tissue injury, clip migration, and inadequate sampling. Informed, written consent was given. The usual time out protocol was performed immediately prior to the procedure. Using sterile technique, 1% Lidocaine, MRI guidance, and a 9 gauge vacuum assisted device, biopsy was performed of non mass enhancement in the lower outer anterior left breast using a lateral approach. At the conclusion of the procedure, a a cylinder tissue marker clip was deployed into the biopsy cavity. Follow-up 2-view mammogram was performed and dictated separately. Using sterile technique, 1% Lidocaine, MRI guidance, and a 9 gauge vacuum assisted device, biopsy was performed of a mass in the lower outer posterior left breast using a lateral approach. At the conclusion of the procedure, a a barbell tissue marker clip was deployed into the biopsy cavity. Follow-up 2-view mammogram  was performed and dictated separately. IMPRESSION: MRI guided biopsy of non mass enhancement in the lower outer anterior left breast and of a mass in the lower outer posterior left breast. No apparent complications. Electronically Signed: By: Emmaline Kluver M.D. On: 10/05/2022 10:42  MM CLIP PLACEMENT LEFT  Result Date: 10/05/2022 CLINICAL DATA:  Post procedure mammogram for clip placement EXAM: 3D DIAGNOSTIC LEFT MAMMOGRAM POST MRI BIOPSY COMPARISON:  Previous exam(s). FINDINGS: 3D Mammographic images were obtained following MRI guided biopsy of non mass enhancement in the lower outer left breast anterior. The cylinder biopsy marking clip is in expected position at the site of biopsy. 3D Mammographic images were obtained following MRI guided biopsy of a mass in the lower outer left breast posterior. The barbell biopsy marking clip is in expected position at the site of biopsy. IMPRESSION: Appropriate positioning of the cylinder shaped biopsy marking clip at the site of biopsy in the lower outer left breast anterior. Appropriate positioning of the barbell shaped biopsy marking clip at the site of biopsy in the lower outer left breast posterior. Final Assessment: Post Procedure Mammograms for Marker Placement Electronically Signed   By: Emmaline Kluver M.D.   On: 10/05/2022 10:44  MYOCARDIAL PERFUSION IMAGING  Result Date: 09/22/2022   The study is normal. The study is low risk.   No ST deviation was noted.   Left ventricular function is normal. Nuclear stress EF: 73 %. The left ventricular ejection fraction is hyperdynamic (>65%). End diastolic cavity size is normal.   Prior study available for comparison from 03/15/2019.   DG INJECT DIAG/THERA/INC NEEDLE/CATH/PLC EPI/LUMB/SAC W/IMG  Result Date: 09/14/2022 CLINICAL DATA:  Thoracolumbar region back pain, worse on the left. Good response to previous injection done at T12-L1 FLUOROSCOPY: Radiation Exposure Index (as provided by the fluoroscopic device):  0 minutes 59 seconds 52.31 micro gray meter squared PROCEDURE: THORACIC EPIDURAL INJECTION An interlaminar approach was performed on the left at T12-L1. A 20 gauge epidural needle was advanced using loss-of-resistance technique. DIAGNOSTIC EPIDURAL INJECTION Injection of Isovue-M 300 shows a good epidural pattern with spread above and below the level of needle placement, spreading to both sides.  No vascular opacification is seen. THERAPEUTIC EPIDURAL INJECTION 1.5 ml of Kenalog 40 mixed with 1 ml of 1% Lidocaine and 2 ml of normal saline were then instilled. The procedure was well-tolerated, and the patient was discharged thirty minutes following the injection in good condition. IMPRESSION: Technically successful repeat epidural injection on the left at T12-L1. Electronically Signed   By: Paulina Fusi M.D.   On: 09/14/2022 12:14     No orders of the defined types were placed in this encounter.   All questions were answered. The patient knows to call the clinic with any problems, questions or concerns. The total time spent in the appointment was 45 minutes.     Malachy Mood, MD 10/12/2022 3:27 PM  I, Monica Martinez, am acting as scribe for Malachy Mood, MD.   I have reviewed the above documentation for accuracy and completeness, and I agree with the above.

## 2022-10-12 NOTE — Pre-Procedure Instructions (Signed)
Surgical Instructions    Your procedure is scheduled on October 18, 2022.  Report to [facility] Main Entrance "A" at 5:30 A.M., then check in with the Admitting office.  Call this number if you have problems the morning of surgery:  [phone]  If you have any questions prior to your surgery date call [phone]: Open Monday-Friday 8am-4pm If you experience any cold or flu symptoms such as cough, fever, chills, shortness of breath, etc. between now and your scheduled surgery, please notify us at the above number.     Remember:  Do not eat after midnight the night before your surgery  You may drink clear liquids until 4:30 AM the morning of your surgery.   Clear liquids allowed are: Water, Non-Citrus Juices (without pulp), Carbonated Beverages, Clear Tea, Black Coffee Only (NO MILK, CREAM OR POWDERED CREAMER of any kind), and Gatorade.     Take these medicines the morning of surgery with A SIP OF WATER:  acetaminophen (TYLENOL)   budesonide (ENTOCORT EC)   clonazePAM (KLONOPIN)   famotidine (PEPCID)   pindolol (VISKEN)   risperiDONE (RISPERDAL)   venlafaxine XR (EFFEXOR XR)    May take these medicines IF NEEDED:  albuterol (VENTOLIN HFA) inhaler   ipratropium-albuterol (DUONEB)   nitroGLYCERIN (NITROSTAT)    Follow your surgeon's instructions on when to stop Aspirin.  If no instructions were given by your surgeon then you will need to call the office to get those instructions.     As of today, STOP taking any Aleve, Naproxen, Ibuprofen, Motrin, Advil, Goody's, BC's, all herbal medications, fish oil, and all vitamins.                     Do NOT Smoke (Tobacco/Vaping) for 24 hours prior to your procedure.  If you use a CPAP at night, you may bring your mask/headgear for your overnight stay.   Contacts, glasses, piercing's, hearing aid's, dentures or partials may not be worn into surgery, please bring cases for these belongings.    For patients admitted to the hospital,  discharge time will be determined by your treatment team.   Patients discharged the day of surgery will not be allowed to drive home, and someone needs to stay with them for 24 hours.  SURGICAL WAITING ROOM VISITATION Patients having surgery or a procedure may have no more than 2 support people in the waiting area - these visitors may rotate.   Children under the age of 62 must have an adult with them who is not the patient. If the patient needs to stay at the hospital during part of their recovery, the visitor guidelines for inpatient rooms apply. Pre-op nurse will coordinate an appropriate time for 1 support person to accompany patient in pre-op.  This support person may not rotate.   Please refer to the [facility] website for the visitor guidelines for Inpatients (after your surgery is over and you are in a regular room).    Special instructions:   [facility]- Preparing For Surgery  Before surgery, you can play an important role. Because skin is not sterile, your skin needs to be as free of germs as possible. You can reduce the number of germs on your skin by washing with CHG (chlorahexidine gluconate) Soap before surgery.  CHG is an antiseptic cleaner which kills germs and bonds with the skin to continue killing germs even after washing.    Oral Hygiene is also important to reduce your risk of infection.  Remember -  BRUSH YOUR TEETH THE MORNING OF SURGERY WITH YOUR REGULAR TOOTHPASTE  Please do not use if you have an allergy to CHG or antibacterial soaps. If your skin becomes reddened/irritated stop using the CHG.  Do not shave (including legs and underarms) for at least 48 hours prior to first CHG shower. It is OK to shave your face.  Please follow these instructions carefully.   Shower the NIGHT BEFORE SURGERY and the MORNING OF SURGERY  If you chose to wash your hair, wash your hair first as usual with your normal shampoo.  After you shampoo, rinse your hair and body thoroughly  to remove the shampoo.  Use CHG Soap as you would any other liquid soap. You can apply CHG directly to the skin and wash gently with a scrungie or a clean washcloth.   Apply the CHG Soap to your body ONLY FROM THE NECK DOWN.  Do not use on open wounds or open sores. Avoid contact with your eyes, ears, mouth and genitals (private parts). Wash Face and genitals (private parts)  with your normal soap.   Wash thoroughly, paying special attention to the area where your surgery will be performed.  Thoroughly rinse your body with warm water from the neck down.  DO NOT shower/wash with your normal soap after using and rinsing off the CHG Soap.  Pat yourself dry with a CLEAN TOWEL.  Wear CLEAN PAJAMAS to bed the night before surgery  Place CLEAN SHEETS on your bed the night before your surgery  DO NOT SLEEP WITH PETS.   Day of Surgery: Take a shower with CHG soap. Do not wear jewelry or makeup Do not wear lotions, powders, perfumes/colognes, or deodorant. Do not shave 48 hours prior to surgery.  Men may shave face and neck. Do not bring valuables to the hospital.  [facility] is not responsible for any belongings or valuables. Do not wear nail polish, gel polish, artificial nails, or any other type of covering on natural nails (fingers and toes) If you have artificial nails or gel coating that need to be removed by a nail salon, please have this removed prior to surgery. Artificial nails or gel coating may interfere with anesthesia's ability to adequately monitor your vital signs.  Wear Clean/Comfortable clothing the morning of surgery Remember to brush your teeth WITH YOUR REGULAR TOOTHPASTE.   Please read over the following fact sheets that you were given.    If you received a COVID test during your pre-op visit  it is requested that you wear a mask when out in public, stay away from anyone that may not be feeling well and notify your surgeon if you develop symptoms. If you have been  in contact with anyone that has tested positive in the last 10 days please notify you surgeon.

## 2022-10-13 ENCOUNTER — Encounter (HOSPITAL_COMMUNITY)
Admission: RE | Admit: 2022-10-13 | Discharge: 2022-10-13 | Disposition: A | Discharge status: Home / Self Care | Payer: PPO | Source: Ambulatory Visit | Attending: Obstetrics and Gynecology | Admitting: Obstetrics and Gynecology

## 2022-10-13 ENCOUNTER — Other Ambulatory Visit: Payer: Self-pay

## 2022-10-13 ENCOUNTER — Encounter (HOSPITAL_COMMUNITY): Payer: Self-pay

## 2022-10-13 ENCOUNTER — Other Ambulatory Visit: Payer: Self-pay | Admitting: *Deleted

## 2022-10-13 ENCOUNTER — Telehealth (INDEPENDENT_AMBULATORY_CARE_PROVIDER_SITE_OTHER): Payer: PPO | Admitting: Family Medicine

## 2022-10-13 VITALS — BP 101/60 | HR 64 | Temp 98.5°F | Resp 18 | Ht 61.0 in | Wt 159.3 lb

## 2022-10-13 DIAGNOSIS — I519 Heart disease, unspecified: Secondary | ICD-10-CM | POA: Diagnosis not present

## 2022-10-13 DIAGNOSIS — I2510 Atherosclerotic heart disease of native coronary artery without angina pectoris: Secondary | ICD-10-CM | POA: Insufficient documentation

## 2022-10-13 DIAGNOSIS — C50912 Malignant neoplasm of unspecified site of left female breast: Secondary | ICD-10-CM

## 2022-10-13 DIAGNOSIS — F32A Depression, unspecified: Secondary | ICD-10-CM | POA: Diagnosis not present

## 2022-10-13 DIAGNOSIS — Z01812 Encounter for preprocedural laboratory examination: Secondary | ICD-10-CM | POA: Insufficient documentation

## 2022-10-13 DIAGNOSIS — C50512 Malignant neoplasm of lower-outer quadrant of left female breast: Secondary | ICD-10-CM

## 2022-10-13 DIAGNOSIS — Z955 Presence of coronary angioplasty implant and graft: Secondary | ICD-10-CM | POA: Diagnosis not present

## 2022-10-13 DIAGNOSIS — F419 Anxiety disorder, unspecified: Secondary | ICD-10-CM | POA: Diagnosis not present

## 2022-10-13 DIAGNOSIS — I5032 Chronic diastolic (congestive) heart failure: Secondary | ICD-10-CM

## 2022-10-13 DIAGNOSIS — E785 Hyperlipidemia, unspecified: Secondary | ICD-10-CM | POA: Insufficient documentation

## 2022-10-13 DIAGNOSIS — Z01818 Encounter for other preprocedural examination: Secondary | ICD-10-CM

## 2022-10-13 DIAGNOSIS — C50919 Malignant neoplasm of unspecified site of unspecified female breast: Secondary | ICD-10-CM | POA: Insufficient documentation

## 2022-10-13 LAB — TYPE AND SCREEN
ABO/RH(D): O POS
Antibody Screen: NEGATIVE

## 2022-10-13 LAB — SURGICAL PCR SCREEN
MRSA, PCR: POSITIVE — AB
Staphylococcus aureus: POSITIVE — AB

## 2022-10-13 NOTE — Progress Notes (Signed)
Error

## 2022-10-13 NOTE — Assessment & Plan Note (Signed)
Recent stress test and cleared by cardiology for upcoming surgery

## 2022-10-13 NOTE — Progress Notes (Signed)
Victoria [name] with Dr. 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

## 2022-10-13 NOTE — Assessment & Plan Note (Signed)
Overall stable on current meds and managing her new diagnosis well. Denclines new referral or change in meds

## 2022-10-13 NOTE — Progress Notes (Signed)
PCP - Dr. Danise Edge Cardiologist - Dr. Laurance Flatten  PPM/ICD - Denies Device Orders - n/a Rep Notified - n/a  Chest x-ray - n/a EKG - 09/20/2022 Stress Test - 09/22/2022 ECHO - 03/20/2019 Cardiac Cath - 08/23/2017  Sleep Study - +OSA but pt unable to tolerate CPAP due to frequent coughing at night related to asthma  No DM  Last dose of GLP1 agonist- n/a GLP1 instructions: n/a  Blood Thinner Instructions: n/a Aspirin Instructions: Pts last dose of ASA was April 22nd  ERAS Protcol - Clear liquids until 0430 morning of surgery PRE-SURGERY Ensure or G2- n/a  COVID TEST- n/a   Anesthesia review: Yes. Pt received cardiac clearance   Patient denies shortness of breath, fever, cough and chest pain at PAT appointment. Pt denies any respiratory illness/infection in the last two months.   All instructions explained to the patient, with a verbal understanding of the material. Patient agrees to go over the instructions while at home for a better understanding. Patient also instructed to self quarantine after being tested for COVID-19. The opportunity to ask questions was provided.

## 2022-10-13 NOTE — H&P (Signed)
67 year old G 1 P 1 presents for treatment of pelvic organ prolapse. She has severe prolapse - was scheduled earlier in the year for surgery but rescheduled due to mental health issues that have improved. Patient is positive for PALB positive - never followed up with General surgeon to get info about mastectomy/ risk reduction. Unfortunately, she has recently been diagnosed with breast cancer and Dr Carolynne Edouard will be performing lumpectomy/ LND on May 17. She was clear by cardiology. She had positive screen for MRSA in preop. Also. She has had extensive abdominal hernia repair with mesh and Laparoscopy not an option for surgery.   Past Medical History:  Diagnosis Date   Allergic bronchopulmonary aspergillosis 2008   sees Dr Wenda Overland pulmonology   Anemia    iron deficiency, resolved   Anxiety    Asthma    Breast cancer    CAD (coronary artery disease)    a. LHC 6/16:  oOM1 60, pRCA 25 >> med Rx b. cath 3/19 2nd OM with 95% stenosis s/p synergy DES & anomalous RCA   CAP (community acquired pneumonia) 2016; 06/07/2016   Chronic bronchitis    Chronic lower back pain    Complication of anesthesia    "think I have a hard time waking up from it"   Depression    mild   Diverticulitis    Diverticulosis    GERD (gastroesophageal reflux disease)    H/O hiatal hernia    Headache    "weekly" (08/23/2017)   History of echocardiogram    Echo 6/16:  Mod LVH, EF 60-65%, no RWMA, Gr 1 DD, trivial MR, normal LA size.   Hyperglycemia 11/20/2015   Hyperlipidemia, mixed 09/11/2007   Qualifier: Diagnosis of  By: Janit Bern   Did not tolerate Lipitor, zocor, Lovastatin, Pravastatin, Livalo, Crestor even low dose    IBS (irritable bowel syndrome)    Maxillary sinusitis    Normal cardiac stress test 11/2011   No evidence of ischemia or infarct.   Calculated ejection fraction 72%.   Obesity    OSA (obstructive sleep apnea) 02/2012   has stopped using  cpap   Osteoarthritis    Osteoporosis     Pneumonia 11/2011   "before 2013 I hadn't had pneumonia since I was a child" (04/13/2012)   Pulmonary nodules    S/P angioplasty with stent 08/23/17 ostial 2nd OM with DES synnergy 08/24/2017   Schatzki's ring    Past Surgical History:  Procedure Laterality Date   APPENDECTOMY  1989   CARDIAC CATHETERIZATION N/A 11/25/2014   Procedure: Right/Left Heart Cath and Coronary Angiography;  Surgeon: Lyn Records, MD;  Location: [facility] INVASIVE CV LAB;  Service: Cardiovascular;  Laterality: N/A;   CESAREAN SECTION  1985   COLONOSCOPY  03/2022   CORONARY ANGIOPLASTY WITH STENT PLACEMENT  08/23/2017   CORONARY STENT INTERVENTION N/A 08/23/2017   Procedure: CORONARY STENT INTERVENTION;  Surgeon: Kathleene Hazel, MD;  Location: [facility] INVASIVE CV LAB;  Service: Cardiovascular;  Laterality: N/A;   HERNIA REPAIR  04/13/2012   VHR laparoscopic   LEFT HEART CATH AND CORONARY ANGIOGRAPHY N/A 08/23/2017   Procedure: LEFT HEART CATH AND CORONARY ANGIOGRAPHY;  Surgeon: Kathleene Hazel, MD;  Location: [facility] INVASIVE CV LAB;  Service: Cardiovascular;  Laterality: N/A;   VENTRAL HERNIA REPAIR  04/13/2012   Procedure: LAPAROSCOPIC VENTRAL HERNIA;  Surgeon: [name], MD;  Location: [facility] OR;  Service: General;  Laterality: N/A;  laparoscopic repair of incarcerated hernia  Prior to Admission medications   Medication Sig Start Date End Date Taking? Authorizing Provider  acetaminophen (TYLENOL) 650 MG CR tablet Take 650 mg by mouth every 8 (eight) hours as needed for pain.   Yes [provider]  albuterol (VENTOLIN HFA) 108 (90 Base) MCG/ACT inhaler Inhale 1-2 puffs into the lungs every 6 (six) hours as needed. 03/07/22  Yes Kalman Shan, MD  ascorbic acid (VITAMIN C) 500 MG tablet Take 500 mg by mouth daily.   Yes [provider]  aspirin EC 81 MG tablet Take 81 mg by mouth daily. Swallow whole.   Yes [provider]  Benralizumab (FASENRA PEN) 30 MG/ML SOAJ Inject 1 mL (30  mg total) into the skin every 8 (eight) weeks. 07/26/21  Yes Kalman Shan, MD  budesonide (ENTOCORT EC) 3 MG 24 hr capsule Take 1 capsule (3 mg total) by mouth daily. 08/26/22  Yes Pyrtle, Carie Caddy, MD  Calcium-Magnesium-Vitamin D (CALCIUM MAGNESIUM PO) Take 1 tablet by mouth at bedtime.   Yes [provider]  Cholecalciferol (VITAMIN D3 ULTRA STRENGTH) 125 MCG (5000 UT) capsule Take 5,000 Units by mouth daily.   Yes [provider]  denosumab (PROLIA) 60 MG/ML SOSY injection Inject 60 mg into the skin every 6 (six) months.   Yes [provider]  doxepin (SINEQUAN) 25 MG capsule Take 1 capsule (25 mg total) by mouth at bedtime. 08/18/22  Yes Sarina Ill, DO  Evolocumab (REPATHA SURECLICK) 140 MG/ML SOAJ Inject 140 mg into the skin every 21 ( twenty-one) days.   Yes [provider]  famotidine (PEPCID) 20 MG tablet Take 20 mg by mouth daily.   Yes [provider]  ibuprofen (ADVIL) 200 MG tablet Take 400 mg by mouth every 6 (six) hours as needed for moderate pain.   Yes [provider]  ipratropium-albuterol (DUONEB) 0.5-2.5 (3) MG/3ML SOLN Take 3 mLs by nebulization every 6 (six) hours as needed. 03/07/22  Yes Kalman Shan, MD  nitroGLYCERIN (NITROSTAT) 0.4 MG SL tablet Place 1 tablet (0.4 mg total) under the tongue every 5 (five) minutes as needed for chest pain. 09/20/22  Yes Tereso Newcomer T, PA-C  pindolol (VISKEN) 5 MG tablet Take 1 tablet (5 mg total) by mouth daily after breakfast. 08/19/22  Yes Sarina Ill, DO  risperiDONE (RISPERDAL) 0.5 MG tablet Take 1 tablet (0.5 mg total) by mouth 2 (two) times daily at 8 am and 4 pm. 08/18/22  Yes Sarina Ill, DO  venlafaxine XR (EFFEXOR XR) 75 MG 24 hr capsule Take 3 capsules (225 mg total) by mouth daily with breakfast. 09/30/22  Yes Stasia Cavalier, MD  clonazePAM (KLONOPIN) 0.5 MG tablet Take 1 tablet (0.5 mg total) by mouth daily. 10/26/22   Stasia Cavalier, MD  Oscar La hcl], Beclomethasone dipropionate, Flexeril [cyclobenzaprine], Mometasone furo-formoterol fum, Sulfonamide derivatives, and Statins Family History  Problem Relation Age of Onset   Breast cancer Mother 27       bilateral breast cancer dx. 8, met to liver   Hypertension Mother    Diabetes Mother    Diverticulosis Father    Prostate cancer Father    Breast cancer Sister 54       DCIS at 87, IDC at 5, PALB2+   Cancer Sister        breast cancer, invasive ductal carcinoma in 2022,DCIS at 9 with 4 weeks of radiation, 5 years of Tamoxifen    Pulmonary embolism Brother  recurrent   Heart attack Maternal Grandfather    Ovarian cancer Paternal Grandmother    Cerebral palsy Son    Prostate cancer Paternal Uncle    Breast cancer Niece 4       PALB2+   Osteoporosis Niece    Stroke Neg Hx    Colon cancer Neg Hx    Esophageal cancer Neg Hx    Stomach cancer Neg Hx    Rectal cancer Neg Hx    Social History   Socioeconomic History   Marital status: Married    Spouse name: Not on file   Number of children: 1   Years of education: 14   Highest education level: Associate degree: academic program  Occupational History   Occupation: Disabled   Tobacco Use   Smoking status: Never    Passive exposure: Never   Smokeless tobacco: Never  Vaping Use   Vaping Use: Never used  Substance and Sexual Activity   Alcohol use: Yes    Alcohol/week: 4.0 - 7.0 standard drinks of alcohol    Types: 4 - 7 Standard drinks or equivalent per week   Drug use: No   Sexual activity: Yes    Comment: gluten free, lives with husband and son with CP quadriplegia  Other Topics Concern   Not on file  Social History Narrative   Cares for son with cerebral palsy.    Lives at home with her husband and son.   Right-handed.   2 cups caffeine per day.   One story home   Social Determinants of Health   Financial Resource Strain: Low Risk  (10/19/2021)   Overall Financial Resource Strain  (CARDIA)    Difficulty of Paying Living Expenses: Not hard at all  Food Insecurity: No Food Insecurity (08/19/2022)   Hunger Vital Sign    Worried About Running Out of Food in the Last Year: Never true    Ran Out of Food in the Last Year: Never true  Transportation Needs: No Transportation Needs (08/19/2022)   PRAPARE - Administrator, Civil Service (Medical): No    Lack of Transportation (Non-Medical): No  Physical Activity: Insufficiently Active (10/19/2021)   Exercise Vital Sign    Days of Exercise per Week: 7 days    Minutes of Exercise per Session: 20 min  Stress: Stress Concern Present (10/19/2021)   [employer] of Occupational Health - Occupational Stress Questionnaire    Feeling of Stress : Rather much  Social Connections: Moderately Integrated (10/19/2021)   Social Connection and Isolation Panel [NHANES]    Frequency of Communication with Friends and Family: More than three times a week    Frequency of Social Gatherings with Friends and Family: Once a week    Attends Religious Services: More than 4 times per year    Active Member of [employer] or Organizations: No    Attends Banker Meetings: Never    Marital Status: Married   There were no vitals taken for this visit. General alert and oriented Lung CTAB  Car RRR  Abdomen multiple surgical scars  Pelvic EGBUS total pelvic organ prolapse   IMPRESSION: Pelvic organ prolapse PALB + Newly diagnosed breast cancer Significant cardiac history  MRSA +  PLAN Vaginal hysterectomy, ap repair, if ovaries are not removable via vaginal approach will then perform minilap and remove ovaries that way

## 2022-10-13 NOTE — Assessment & Plan Note (Signed)
Is following with cancer center and Dr Carolynne Edouard the surgeon. Plan is lumpectomy, 4 weeks of radiation and 5 years of tamoxifen. She is calm and handling it well today

## 2022-10-13 NOTE — Assessment & Plan Note (Signed)
No recent axacerbation or concerns

## 2022-10-13 NOTE — Progress Notes (Signed)
MyChart Video Visit    Virtual Visit via Video Note   This visit type was conducted due to national recommendations for restrictions regarding the COVID-19 Pandemic (e.g. social distancing) in an effort to limit this patient's exposure and mitigate transmission in our community. This patient is at least at moderate risk for complications without adequate follow up. This format is felt to be most appropriate for this patient at this time. Physical exam was limited by quality of the video and audio technology used for the visit. Shamaine, CMA was able to get the patient set up on a video visit.  Patient location: home Patient and provider in visit Provider location: Office  I discussed the limitations of evaluation and management by telemedicine and the availability of in person appointments. The patient expressed understanding and agreed to proceed.  Visit Date: 10/13/2022  Today's healthcare provider: Danise Edge, MD     Subjective:    Patient ID: Victoria [name], female    DOB: 05-01-1956, 67 y.o.   MRN: [id number]  Chief Complaint  Patient presents with   Follow-up    Follow up    HPI Patient is in today for follow up on chronic medical concerns. No recent febrile illness or hospitalizations. Denies CP/palp/SOB/HA/congestion/fevers/GI or GU c/o. Taking meds as prescribed. She does have newly diagnosed breast cancer and is under a tremendous amount of stress but she feels she is managing it well and does not need an adjustment of meds. Seeing the cancer center. Grade 1, slow growing and very treatable. Seeing Dr Llana Aliment and is still planning to proceed with hysterectomy first and then a lumpectomy in about a month followed by 4 weeks of radiation then 5 years of Tamoxifen.Surgeon Dr Carolynne Edouard is going to do her surgery . She feels well but she is gaining weight with increased appetite. Past Medical History:  Diagnosis Date   Allergic bronchopulmonary aspergillosis 2008   sees Dr  Wenda Overland pulmonology   Anemia    iron deficiency, resolved   Anxiety    Asthma    Breast cancer    CAD (coronary artery disease)    a. LHC 6/16:  oOM1 60, pRCA 25 >> med Rx b. cath 3/19 2nd OM with 95% stenosis s/p synergy DES & anomalous RCA   CAP (community acquired pneumonia) 2016; 06/07/2016   Chronic bronchitis    Chronic lower back pain    Complication of anesthesia    "think I have a hard time waking up from it"   Depression    mild   Diverticulitis    Diverticulosis    GERD (gastroesophageal reflux disease)    H/O hiatal hernia    Headache    "weekly" (08/23/2017)   History of echocardiogram    Echo 6/16:  Mod LVH, EF 60-65%, no RWMA, Gr 1 DD, trivial MR, normal LA size.   Hyperglycemia 11/20/2015   Hyperlipidemia, mixed 09/11/2007   Qualifier: Diagnosis of  By: Janit Bern   Did not tolerate Lipitor, zocor, Lovastatin, Pravastatin, Livalo, Crestor even low dose    IBS (irritable bowel syndrome)    Maxillary sinusitis    Normal cardiac stress test 11/2011   No evidence of ischemia or infarct.   Calculated ejection fraction 72%.   Obesity    OSA (obstructive sleep apnea) 02/2012   has stopped using  cpap   Osteoarthritis    Osteoporosis    Pneumonia 11/2011   "before 2013 I hadn't had pneumonia since I was  a child" (04/13/2012)   Pulmonary nodules    S/P angioplasty with stent 08/23/17 ostial 2nd OM with DES synnergy 08/24/2017   Schatzki's ring     Past Surgical History:  Procedure Laterality Date   APPENDECTOMY  1989   CARDIAC CATHETERIZATION N/A 11/25/2014   Procedure: Right/Left Heart Cath and Coronary Angiography;  Surgeon: Lyn Records, MD;  Location: [facility] INVASIVE CV LAB;  Service: Cardiovascular;  Laterality: N/A;   CESAREAN SECTION  1985   COLONOSCOPY  03/2022   CORONARY ANGIOPLASTY WITH STENT PLACEMENT  08/23/2017   CORONARY STENT INTERVENTION N/A 08/23/2017   Procedure: CORONARY STENT INTERVENTION;  Surgeon: Kathleene Hazel, MD;   Location: [facility] INVASIVE CV LAB;  Service: Cardiovascular;  Laterality: N/A;   HERNIA REPAIR  04/13/2012   VHR laparoscopic   LEFT HEART CATH AND CORONARY ANGIOGRAPHY N/A 08/23/2017   Procedure: LEFT HEART CATH AND CORONARY ANGIOGRAPHY;  Surgeon: Kathleene Hazel, MD;  Location: [facility] INVASIVE CV LAB;  Service: Cardiovascular;  Laterality: N/A;   VENTRAL HERNIA REPAIR  04/13/2012   Procedure: LAPAROSCOPIC VENTRAL HERNIA;  Surgeon: [name], MD;  Location: [facility] OR;  Service: General;  Laterality: N/A;  laparoscopic repair of incarcerated hernia    Family History  Problem Relation Age of Onset   Breast cancer Mother 36       bilateral breast cancer dx. 68, met to liver   Hypertension Mother    Diabetes Mother    Diverticulosis Father    Prostate cancer Father    Breast cancer Sister 57       DCIS at 54, IDC at 75, PALB2+   Cancer Sister        breast cancer, invasive ductal carcinoma in 2022,DCIS at 22 with 4 weeks of radiation, 5 years of Tamoxifen    Pulmonary embolism Brother        recurrent   Heart attack Maternal Grandfather    Ovarian cancer Paternal Grandmother    Cerebral palsy Son    Prostate cancer Paternal Uncle    Breast cancer Niece 28       PALB2+   Osteoporosis Niece    Stroke Neg Hx    Colon cancer Neg Hx    Esophageal cancer Neg Hx    Stomach cancer Neg Hx    Rectal cancer Neg Hx     Social History   Socioeconomic History   Marital status: Married    Spouse name: Not on file   Number of children: 1   Years of education: 14   Highest education level: Associate degree: academic program  Occupational History   Occupation: Disabled   Tobacco Use   Smoking status: Never    Passive exposure: Never   Smokeless tobacco: Never  Vaping Use   Vaping Use: Never used  Substance and Sexual Activity   Alcohol use: Yes    Alcohol/week: 4.0 - 7.0 standard drinks of alcohol    Types: 4 - 7 Standard drinks or equivalent per week   Drug use: No   Sexual  activity: Yes    Comment: gluten free, lives with husband and son with CP quadriplegia  Other Topics Concern   Not on file  Social History Narrative   Cares for son with cerebral palsy.    Lives at home with her husband and son.   Right-handed.   2 cups caffeine per day.   One story home   Social Determinants of Health   Financial Resource Strain: Low Risk  (  10/19/2021)   Overall Financial Resource Strain (CARDIA)    Difficulty of Paying Living Expenses: Not hard at all  Food Insecurity: No Food Insecurity (08/19/2022)   Hunger Vital Sign    Worried About Running Out of Food in the Last Year: Never true    Ran Out of Food in the Last Year: Never true  Transportation Needs: No Transportation Needs (08/19/2022)   PRAPARE - Administrator, Civil Service (Medical): No    Lack of Transportation (Non-Medical): No  Physical Activity: Insufficiently Active (10/19/2021)   Exercise Vital Sign    Days of Exercise per Week: 7 days    Minutes of Exercise per Session: 20 min  Stress: Stress Concern Present (10/19/2021)   [employer] of Occupational Health - Occupational Stress Questionnaire    Feeling of Stress : Rather much  Social Connections: Moderately Integrated (10/19/2021)   Social Connection and Isolation Panel [NHANES]    Frequency of Communication with Friends and Family: More than three times a week    Frequency of Social Gatherings with Friends and Family: Once a week    Attends Religious Services: More than 4 times per year    Active Member of [employer] or Organizations: No    Attends Banker Meetings: Never    Marital Status: Married  Catering manager Violence: Not At Risk (08/12/2022)   Humiliation, Afraid, Rape, and Kick questionnaire    Fear of Current or Ex-Partner: No    Emotionally Abused: No    Physically Abused: No    Sexually Abused: No    Outpatient Medications Prior to Visit  Medication Sig Dispense Refill   acetaminophen (TYLENOL) 650 MG CR  tablet Take 650 mg by mouth every 8 (eight) hours as needed for pain.     albuterol (VENTOLIN HFA) 108 (90 Base) MCG/ACT inhaler Inhale 1-2 puffs into the lungs every 6 (six) hours as needed. 8 g 2   ascorbic acid (VITAMIN C) 500 MG tablet Take 500 mg by mouth daily.     aspirin EC 81 MG tablet Take 81 mg by mouth daily. Swallow whole.     Benralizumab (FASENRA PEN) 30 MG/ML SOAJ Inject 1 mL (30 mg total) into the skin every 8 (eight) weeks. 1 mL 2   budesonide (ENTOCORT EC) 3 MG 24 hr capsule Take 1 capsule (3 mg total) by mouth daily. 30 capsule 5   Calcium-Magnesium-Vitamin D (CALCIUM MAGNESIUM PO) Take 1 tablet by mouth at bedtime.     Cholecalciferol (VITAMIN D3 ULTRA STRENGTH) 125 MCG (5000 UT) capsule Take 5,000 Units by mouth daily.     [START ON 10/26/2022] clonazePAM (KLONOPIN) 0.5 MG tablet Take 1 tablet (0.5 mg total) by mouth daily. 30 tablet 0   denosumab (PROLIA) 60 MG/ML SOSY injection Inject 60 mg into the skin every 6 (six) months.     doxepin (SINEQUAN) 25 MG capsule Take 1 capsule (25 mg total) by mouth at bedtime. 30 capsule 3   Evolocumab (REPATHA SURECLICK) 140 MG/ML SOAJ Inject 140 mg into the skin every 21 ( twenty-one) days.     famotidine (PEPCID) 20 MG tablet Take 20 mg by mouth daily.     ibuprofen (ADVIL) 200 MG tablet Take 400 mg by mouth every 6 (six) hours as needed for moderate pain.     ipratropium-albuterol (DUONEB) 0.5-2.5 (3) MG/3ML SOLN Take 3 mLs by nebulization every 6 (six) hours as needed. 120 mL 5   nitroGLYCERIN (NITROSTAT) 0.4 MG SL tablet Place 1  tablet (0.4 mg total) under the tongue every 5 (five) minutes as needed for chest pain. 25 tablet 11   pindolol (VISKEN) 5 MG tablet Take 1 tablet (5 mg total) by mouth daily after breakfast. 30 tablet 3   risperiDONE (RISPERDAL) 0.5 MG tablet Take 1 tablet (0.5 mg total) by mouth 2 (two) times daily at 8 am and 4 pm. 60 tablet 3   venlafaxine XR (EFFEXOR XR) 75 MG 24 hr capsule Take 3 capsules (225 mg total) by  mouth daily with breakfast. 90 capsule 1   Facility-Administered Medications Prior to Visit  Medication Dose Route Frequency Provider Last Rate Last Admin   ketorolac (TORADOL) 15 MG/ML injection 15 mg  15 mg Intravenous Once [name], MD        Allergies  Allergen Reactions   Kasandra Knudsen [Lurasidone Hcl] Other (See Comments)    PER THE PT CAUSED RESTLESSNESS   Beclomethasone Dipropionate Hives and Other (See Comments)     weight gain   Flexeril [Cyclobenzaprine] Anxiety   Mometasone Furo-Formoterol Fum Hives and Other (See Comments)    weight gain   Sulfonamide Derivatives Hives and Rash   Statins Other (See Comments)    Myalgias, RLS    Review of Systems  Constitutional:  Negative for fever and malaise/fatigue.  HENT:  Negative for congestion.   Eyes:  Negative for blurred vision.  Respiratory:  Negative for shortness of breath.   Cardiovascular:  Negative for chest pain, palpitations and leg swelling.  Gastrointestinal:  Negative for abdominal pain, blood in stool and nausea.  Genitourinary:  Negative for dysuria and frequency.  Musculoskeletal:  Negative for falls.  Skin:  Negative for rash.  Neurological:  Negative for dizziness, loss of consciousness and headaches.  Endo/Heme/Allergies:  Negative for environmental allergies.  Psychiatric/Behavioral:  Negative for depression. The patient is nervous/anxious.        Objective:    Physical Exam Constitutional:      General: She is not in acute distress.    Appearance: Normal appearance. She is not ill-appearing or toxic-appearing.  HENT:     Head: Normocephalic and atraumatic.     Right Ear: External ear normal.     Left Ear: External ear normal.     Nose: Nose normal.  Eyes:     General:        Right eye: No discharge.        Left eye: No discharge.  Pulmonary:     Effort: Pulmonary effort is normal.  Skin:    Findings: No rash.  Neurological:     Mental Status: She is alert and oriented to person, place,  and time.  Psychiatric:        Behavior: Behavior normal.     There were no vitals taken for this visit. Wt Readings from Last 3 Encounters:  10/13/22 159 lb 4.8 oz (72.3 kg)  10/12/22 159 lb 12.8 oz (72.5 kg)  09/22/22 157 lb (71.2 kg)       Assessment & Plan:  There are no diagnoses linked to this encounter.   I discussed the assessment and treatment plan with the patient. The patient was provided an opportunity to ask questions and all were answered. The patient agreed with the plan and demonstrated an understanding of the instructions.   The patient was advised to call back or seek an in-person evaluation if the symptoms worsen or if the condition fails to improve as anticipated.  Danise Edge, MD [facility] at [facility] [phone] (phone) [phone] (fax)  [facility]

## 2022-10-14 ENCOUNTER — Other Ambulatory Visit: Payer: Self-pay | Admitting: General Surgery

## 2022-10-14 DIAGNOSIS — C50512 Malignant neoplasm of lower-outer quadrant of left female breast: Secondary | ICD-10-CM

## 2022-10-14 NOTE — Progress Notes (Signed)
Anesthesia Chart Review:  Follows with cardiology for history of CAD s/p DES to OM 08/09/2017, HLD.  She was seen by Tereso Newcomer 09/20/22 for evaluation of recent episode of chest pain as well as preoperative assessment.  Per note, "Preoperative cardiovascular examination Her RCRI (Revised Cardiac Risk Index) is 2 placing her at high risk (6.6%) for perioperative major cardiac events. She had an episode of chest pain yesterday concerning for angina. She requires further workup to better assess her perioperative risk. As noted, she will have a stat Troponin drawn today. If this is abnormal, she will need to be admitted for further workup. If this is normal, she will need a nuclear stress test to better evaluate her symptoms. Further recommendations to follow.  Addendum 09/23/2022: Stat troponin was normal.  Nuclear stress test was also normal.  The patient may proceed with her surgery at acceptable risk.  She can hold aspirin for 7 days prior to her surgery.  This should be resumed soon as possible after surgery when felt to be safe."  Follows with pulmonology for history of severe asthma with allergic bronchopulmonary aspergillosisand elevated IgE and eosinophilia.  She is maintained on Fasenra, Asmanex, as needed albuterol.  Last seen by Dr. Marchelle Gearing 09/07/2022 and her asthma was noted to be well-controlled.  Also commented on upcoming surgery.  Per note, "- low moderate risk for pulmonary complications following hystererectomy as long as asthma not in flare up in the week leading to surgery"  Hx of OSA, intolerant to CPAP.  Preop labs reviewed, unremarkable.   EKG 09/20/22: NSR. Rate 72. Cannot rule out anterior infarct, age undetermined.   Nuclear stress 09/22/2022:   The study is normal. The study is low risk.   No ST deviation was noted.   Left ventricular function is normal. Nuclear stress EF: 73 %. The left ventricular ejection fraction is hyperdynamic (>65%). End diastolic cavity size is normal.    Prior study available for comparison from 03/15/2019.  Event monitor 04/07/2022:   Patch wear time was 6 days and 22 hours   Predominant rhythm was NSR with average HR 78bpm   There were 6 runs of SVT with longest lasting 15 beats   Rare ectopy (<1% SVE and <1% VE)   No sustained arrhythmias or significant pauses   Patient triggered events correlate with NSR  TTE 03/20/2019:  1. Left ventricular ejection fraction, by visual estimation, is 60 to  65%. The left ventricle has normal function. Normal left ventricular size.  Mildly increased left ventricular posterior wall thickness. There is  mildly increased left ventricular  hypertrophy.   2. Elevated left ventricular end-diastolic pressure.   3. Left ventricular diastolic Doppler parameters are consistent with  impaired relaxation pattern of LV diastolic filling.   4. Global right ventricle has normal systolic function.The right  ventricular size is normal. No increase in right ventricular wall  thickness.   5. Left atrial size was normal.   6. Right atrial size was normal.   7. The mitral valve is normal in structure. No evidence of mitral valve  regurgitation. No evidence of mitral stenosis.   8. The tricuspid valve is normal in structure. Tricuspid valve  regurgitation is trivial.   9. The aortic valve is normal in structure. Aortic valve regurgitation is  mild by color flow Doppler. Structurally normal aortic valve, with no  evidence of sclerosis or stenosis.  10. The pulmonic valve was normal in structure. Pulmonic valve  regurgitation is not visualized by color  flow Doppler.  11. Aortic dilatation noted.  12. There is mild dilatation of the ascending aorta measuring 36 mm.  13. Normal pulmonary artery systolic pressure.  14. The inferior vena cava is normal in size with greater than 50%  respiratory variability, suggesting right atrial pressure of 3 mmHg.   Cath and PCI 08/23/2017: 1. Severe single vessel CAD with severe  stenosis in the ostium of the large obtuse marginal branch.  2. Successful PTCA/DES x 1 ostium of the OM. 3. 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  4. The LAD has no obstructive disease.  5. Normal LV systolic function   Recommendations: Will continue DAPT with ASA and Plavix for at least one year. Consider addition of a beta blocker or nitrate.     Zannie Cove [facility] Short Stay Center/Anesthesiology Phone [phone] 10/14/2022 11:36 AM

## 2022-10-14 NOTE — Anesthesia Preprocedure Evaluation (Signed)
Anesthesia Evaluation  Patient identified by MRN, date of birth, ID band Patient awake    Reviewed: Allergy & Precautions, NPO status , Patient's Chart, lab work & pertinent test results  History of Anesthesia Complications (+) PROLONGED EMERGENCE and history of anesthetic complications  Airway Mallampati: III  TM Distance: >3 FB Neck ROM: Full    Dental  (+) Dental Advisory Given,    Pulmonary neg shortness of breath, asthma , neg sleep apnea, neg recent URI   breath sounds clear to auscultation       Cardiovascular (-) angina + CAD and + Cardiac Stents   Rhythm:Regular   1. Left ventricular ejection fraction, by visual estimation, is 60 to  65%. The left ventricle has normal function. Normal left ventricular size.  Mildly increased left ventricular posterior wall thickness. There is  mildly increased left ventricular  hypertrophy.   2. Elevated left ventricular end-diastolic pressure.   3. Left ventricular diastolic Doppler parameters are consistent with  impaired relaxation pattern of LV diastolic filling.   4. Global right ventricle has normal systolic function.The right  ventricular size is normal. No increase in right ventricular wall  thickness.   5. Left atrial size was normal.   6. Right atrial size was normal.   7. The mitral valve is normal in structure. No evidence of mitral valve  regurgitation. No evidence of mitral stenosis.   8. The tricuspid valve is normal in structure. Tricuspid valve  regurgitation is trivial.   9. The aortic valve is normal in structure. Aortic valve regurgitation is  mild by color flow Doppler. Structurally normal aortic valve, with no  evidence of sclerosis or stenosis.  10. The pulmonic valve was normal in structure. Pulmonic valve  regurgitation is not visualized by color flow Doppler.  11. Aortic dilatation noted.  12. There is mild dilatation of the ascending aorta measuring 36 mm.   13. Normal pulmonary artery systolic pressure.  14. The inferior vena cava is normal in size with greater than 50%  respiratory variability, suggesting right atrial pressure of 3 mmHg.     Ost 2nd Mrg lesion is 95% stenosed.  A drug-eluting stent was successfully placed using a STENT SYNERGY DES 3X16.  Post intervention, there is a 0% residual stenosis.  The left ventricular systolic function is normal.  LV end diastolic pressure is normal.  The left ventricular ejection fraction is 55-65% by visual estimate.  There is no mitral valve regurgitation.   1. Severe single vessel CAD with severe stenosis in the ostium of the large obtuse marginal branch.  2. Successful PTCA/DES x 1 ostium of the OM. 3. 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  4. The LAD has no obstructive disease.  5. Normal LV systolic function      Neuro/Psych  Headaches PSYCHIATRIC DISORDERS Anxiety Depression     Neuromuscular disease    GI/Hepatic Neg liver ROS,,,  Endo/Other  negative endocrine ROS    Renal/GU negative Renal ROS     Musculoskeletal  (+) Arthritis ,    Abdominal   Peds  Hematology  (+) Blood dyscrasia, anemia Lab Results      Component                Value               Date  WBC                      4.4                 10/18/2022                HGB                      11.8 (L)            10/18/2022                HCT                      34.8 (L)            10/18/2022                MCV                      89.0                10/18/2022                PLT                      236                 10/18/2022              Anesthesia Other Findings Victoria [name] Your stress test is normal.  You may proceed with your surgery at acceptable risk.  I will send a copy of my note to your surgeon.  You can hold aspirin for 7 days prior to surgery and resume postoperatively when felt to be safe. Tereso Newcomer, PA-C   09/23/2022 1:46 PM     Reproductive/Obstetrics                              Anesthesia Physical Anesthesia Plan  ASA: 3  Anesthesia Plan: General   Post-op Pain Management: Ofirmev IV (intra-op)* and Toradol IV (intra-op)*   Induction: Intravenous  PONV Risk Score and Plan: 3 and Ondansetron and Dexamethasone  Airway Management Planned: Oral ETT  Additional Equipment: None  Intra-op Plan:   Post-operative Plan: Extubation in OR  Informed Consent: I have reviewed the patients History and Physical, chart, labs and discussed the procedure including the risks, benefits and alternatives for the proposed anesthesia with the patient or authorized representative who has indicated his/her understanding and acceptance.     Dental advisory given  Plan Discussed with: CRNA  Anesthesia Plan Comments: (PAT note by Antionette Poles, PA-C:  Follows with cardiology for history of CAD s/p DES to OM 08/09/2017, HLD.  She was seen by Tereso Newcomer 09/20/22 for evaluation of recent episode of chest pain as well as preoperative assessment.  Per note, "Preoperative cardiovascular examination Her RCRI (Revised Cardiac Risk Index) is 2 placing her at high risk (6.6%) for perioperative major cardiac events. She had an episode of chest pain yesterday concerning for angina. She requires further workup to better assess her perioperative risk. As noted, she will have a stat Troponin drawn today. If this is abnormal, she will need to be admitted for further workup. If this is normal, she will need a nuclear stress test to better evaluate her symptoms. Further recommendations  to follow.  Addendum 09/23/2022: Stat troponin was normal.  Nuclear stress test was also normal.  The patient may proceed with her surgery at acceptable risk.  She can hold aspirin for 7 days prior to her surgery.  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  She is maintained on Fasenra, Asmanex, as needed albuterol.  Last seen by Dr. Marchelle Gearing 09/07/2022 and her asthma was noted to be well-controlled.  Also commented on upcoming surgery.  Per note, "- low moderate risk for pulmonary complications following hystererectomy as long as asthma not in flare up in the week leading to surgery"  Hx of OSA, intolerant to CPAP.  Preop labs reviewed, unremarkable.   EKG 09/20/22: NSR. Rate 72. Cannot rule out anterior infarct, age undetermined.   Nuclear stress 09/22/2022:   The study is normal. The study is low risk.   No ST deviation was noted.   Left ventricular function is normal. Nuclear stress EF: 73 %. The left ventricular ejection fraction is hyperdynamic (>65%). End diastolic cavity size is normal.   Prior study available for comparison from 03/15/2019.  Event monitor 04/07/2022:   Patch wear time was 6 days and 22 hours   Predominant rhythm was NSR with average HR 78bpm   There were 6 runs of SVT with longest lasting 15 beats   Rare ectopy (<1% SVE and <1% VE)   No sustained arrhythmias or significant pauses   Patient triggered events correlate with NSR  TTE 03/20/2019:  1. Left ventricular ejection fraction, by visual estimation, is 60 to  65%. The left ventricle has normal function. Normal left ventricular size.  Mildly increased left ventricular posterior wall thickness. There is  mildly increased left ventricular  hypertrophy.   2. Elevated left ventricular end-diastolic pressure.   3. Left ventricular diastolic Doppler parameters are consistent with  impaired relaxation pattern of LV diastolic filling.   4. Global right ventricle has normal systolic function.The right  ventricular size is normal. No increase in right ventricular wall  thickness.   5. Left atrial size was normal.   6. Right atrial size was  normal.   7. The mitral valve is normal in structure. No evidence of mitral valve  regurgitation. No evidence of mitral stenosis.   8. The tricuspid valve is normal in structure. Tricuspid valve  regurgitation is trivial.   9. The aortic valve is normal in structure. Aortic valve regurgitation is  mild by color flow Doppler. Structurally normal aortic valve, with no  evidence of sclerosis or stenosis.  10. The pulmonic valve was normal in structure. Pulmonic valve  regurgitation is not visualized by color flow Doppler.  11. Aortic dilatation noted.  12. There is mild dilatation of the ascending aorta measuring 36 mm.  13. Normal pulmonary artery systolic pressure.  14. The inferior vena cava is normal in size with greater than 50%  respiratory variability, suggesting right atrial pressure of 3 mmHg.   Cath and PCI 08/23/2017: 1. Severe single vessel CAD with severe stenosis in the ostium of the large obtuse marginal branch.  2. Successful PTCA/DES x 1 ostium of the OM. 3. 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  4. The LAD has no obstructive disease.  5. Normal LV systolic function   Recommendations: Will continue DAPT with ASA  and Plavix for at least one year. Consider addition of a beta blocker or nitrate.    )         Anesthesia Quick Evaluation

## 2022-10-17 ENCOUNTER — Other Ambulatory Visit: Payer: Self-pay | Admitting: Nurse Practitioner

## 2022-10-18 ENCOUNTER — Inpatient Hospital Stay (HOSPITAL_COMMUNITY)
Admission: RE | Admit: 2022-10-18 | Discharge: 2022-10-20 | DRG: 743 | Disposition: A | Discharge status: Home / Self Care | Payer: PPO | Attending: Obstetrics and Gynecology | Admitting: Obstetrics and Gynecology

## 2022-10-18 ENCOUNTER — Inpatient Hospital Stay (HOSPITAL_COMMUNITY): Payer: PPO | Admitting: Certified Registered Nurse Anesthetist

## 2022-10-18 ENCOUNTER — Encounter (HOSPITAL_COMMUNITY): Payer: Self-pay | Admitting: Obstetrics and Gynecology

## 2022-10-18 ENCOUNTER — Encounter: Payer: Self-pay | Admitting: *Deleted

## 2022-10-18 ENCOUNTER — Inpatient Hospital Stay (HOSPITAL_COMMUNITY): Payer: PPO | Admitting: Vascular Surgery

## 2022-10-18 ENCOUNTER — Encounter (HOSPITAL_COMMUNITY)
Admission: RE | Disposition: A | Discharge status: Home / Self Care | Payer: Self-pay | Source: Home / Self Care | Attending: Obstetrics and Gynecology

## 2022-10-18 ENCOUNTER — Other Ambulatory Visit: Payer: Self-pay

## 2022-10-18 DIAGNOSIS — C50919 Malignant neoplasm of unspecified site of unspecified female breast: Secondary | ICD-10-CM | POA: Diagnosis not present

## 2022-10-18 DIAGNOSIS — Z955 Presence of coronary angioplasty implant and graft: Secondary | ICD-10-CM | POA: Diagnosis not present

## 2022-10-18 DIAGNOSIS — F418 Other specified anxiety disorders: Secondary | ICD-10-CM | POA: Diagnosis not present

## 2022-10-18 DIAGNOSIS — Z1501 Genetic susceptibility to malignant neoplasm of breast: Secondary | ICD-10-CM

## 2022-10-18 DIAGNOSIS — I2510 Atherosclerotic heart disease of native coronary artery without angina pectoris: Secondary | ICD-10-CM | POA: Diagnosis not present

## 2022-10-18 DIAGNOSIS — N8189 Other female genital prolapse: Secondary | ICD-10-CM

## 2022-10-18 DIAGNOSIS — Z4009 Encounter for prophylactic removal of other organ: Secondary | ICD-10-CM | POA: Diagnosis not present

## 2022-10-18 DIAGNOSIS — Z8614 Personal history of Methicillin resistant Staphylococcus aureus infection: Secondary | ICD-10-CM

## 2022-10-18 DIAGNOSIS — N813 Complete uterovaginal prolapse: Principal | ICD-10-CM

## 2022-10-18 DIAGNOSIS — Z4002 Encounter for prophylactic removal of ovary: Secondary | ICD-10-CM | POA: Diagnosis not present

## 2022-10-18 DIAGNOSIS — Z7982 Long term (current) use of aspirin: Secondary | ICD-10-CM | POA: Diagnosis not present

## 2022-10-18 DIAGNOSIS — Z803 Family history of malignant neoplasm of breast: Secondary | ICD-10-CM

## 2022-10-18 DIAGNOSIS — N8110 Cystocele, unspecified: Principal | ICD-10-CM | POA: Diagnosis present

## 2022-10-18 DIAGNOSIS — M810 Age-related osteoporosis without current pathological fracture: Secondary | ICD-10-CM | POA: Diagnosis present

## 2022-10-18 DIAGNOSIS — Z8041 Family history of malignant neoplasm of ovary: Secondary | ICD-10-CM

## 2022-10-18 DIAGNOSIS — N819 Female genital prolapse, unspecified: Secondary | ICD-10-CM | POA: Diagnosis not present

## 2022-10-18 DIAGNOSIS — Z8249 Family history of ischemic heart disease and other diseases of the circulatory system: Secondary | ICD-10-CM

## 2022-10-18 DIAGNOSIS — Z4003 Encounter for prophylactic removal of fallopian tube(s): Secondary | ICD-10-CM | POA: Diagnosis not present

## 2022-10-18 HISTORY — PX: VAGINAL HYSTERECTOMY: SHX2639

## 2022-10-18 HISTORY — PX: ANTERIOR AND POSTERIOR REPAIR: SHX5121

## 2022-10-18 HISTORY — PX: LAPAROSCOPIC BILATERAL SALPINGO OOPHERECTOMY: SHX5890

## 2022-10-18 LAB — CBC
HCT: 34.8 % — ABNORMAL LOW (ref 36.0–46.0)
Hemoglobin: 11.8 g/dL — ABNORMAL LOW (ref 12.0–15.0)
MCH: 30.2 pg (ref 26.0–34.0)
MCHC: 33.9 g/dL (ref 30.0–36.0)
MCV: 89 fL (ref 80.0–100.0)
Platelets: 236 10*3/uL (ref 150–400)
RBC: 3.91 MIL/uL (ref 3.87–5.11)
RDW: 13.3 % (ref 11.5–15.5)
WBC: 4.4 10*3/uL (ref 4.0–10.5)
nRBC: 0 % (ref 0.0–0.2)

## 2022-10-18 LAB — HIV ANTIBODY (ROUTINE TESTING W REFLEX): HIV Screen 4th Generation wRfx: NONREACTIVE

## 2022-10-18 LAB — RPR: RPR Ser Ql: NONREACTIVE

## 2022-10-18 SURGERY — SALPINGO-OOPHORECTOMY, BILATERAL, LAPAROSCOPIC
Anesthesia: General | Site: Vagina

## 2022-10-18 MED ORDER — ACETAMINOPHEN 500 MG PO TABS: 1000.0000 mg | ORAL_TABLET | Freq: Once | ORAL | Status: DC | PRN

## 2022-10-18 MED ORDER — PROPOFOL 10 MG/ML IV BOLUS
INTRAVENOUS | Status: DC | PRN
  Administered 2022-10-18: 20 mg via INTRAVENOUS
  Administered 2022-10-18: 110 mg via INTRAVENOUS
  Administered 2022-10-18: 10 mg via INTRAVENOUS

## 2022-10-18 MED ORDER — METHOCARBAMOL 750 MG PO TABS
750.0000 mg | ORAL_TABLET | Freq: Three times a day (TID) | ORAL | Status: DC
  Administered 2022-10-18 – 2022-10-19 (×2): 750 mg via ORAL
  Filled 2022-10-18 (×6): qty 1

## 2022-10-18 MED ORDER — LIDOCAINE 2% (20 MG/ML) 5 ML SYRINGE
INTRAMUSCULAR | Status: DC | PRN
  Administered 2022-10-18: 60 mg via INTRAVENOUS

## 2022-10-18 MED ORDER — ALBUTEROL SULFATE (2.5 MG/3ML) 0.083% IN NEBU: 2.5000 mg | INHALATION_SOLUTION | Freq: Four times a day (QID) | RESPIRATORY_TRACT | Status: DC | PRN

## 2022-10-18 MED ORDER — FENTANYL CITRATE (PF) 250 MCG/5ML IJ SOLN
INTRAMUSCULAR | Status: AC
  Filled 2022-10-18: qty 5

## 2022-10-18 MED ORDER — ROCURONIUM BROMIDE 10 MG/ML (PF) SYRINGE
PREFILLED_SYRINGE | INTRAVENOUS | Status: DC | PRN
  Administered 2022-10-18: 60 mg via INTRAVENOUS
  Administered 2022-10-18: 20 mg via INTRAVENOUS

## 2022-10-18 MED ORDER — 0.9 % SODIUM CHLORIDE (POUR BTL) OPTIME
TOPICAL | Status: DC | PRN
  Administered 2022-10-18: 1000 mL

## 2022-10-18 MED ORDER — POVIDONE-IODINE 10 % EX SWAB
2.0000 | Freq: Once | CUTANEOUS | Status: AC
  Administered 2022-10-18: 2 via TOPICAL

## 2022-10-18 MED ORDER — VENLAFAXINE HCL ER 75 MG PO CP24: 225.0000 mg | ORAL_CAPSULE | Freq: Every day | ORAL | Status: DC

## 2022-10-18 MED ORDER — LACTATED RINGERS IV SOLN: INTRAVENOUS | Status: DC

## 2022-10-18 MED ORDER — SIMETHICONE 80 MG PO CHEW
80.0000 mg | CHEWABLE_TABLET | Freq: Four times a day (QID) | ORAL | Status: DC | PRN
  Administered 2022-10-18: 80 mg via ORAL
  Filled 2022-10-18: qty 1

## 2022-10-18 MED ORDER — ALBUTEROL SULFATE HFA 108 (90 BASE) MCG/ACT IN AERS: 1.0000 | INHALATION_SPRAY | Freq: Four times a day (QID) | RESPIRATORY_TRACT | Status: DC | PRN

## 2022-10-18 MED ORDER — ACETAMINOPHEN 10 MG/ML IV SOLN
INTRAVENOUS | Status: AC
  Filled 2022-10-18: qty 100

## 2022-10-18 MED ORDER — OXYCODONE HCL 5 MG PO TABS
5.0000 mg | ORAL_TABLET | ORAL | Status: DC | PRN
  Administered 2022-10-18 (×2): 5 mg via ORAL
  Administered 2022-10-18 – 2022-10-19 (×2): 10 mg via ORAL
  Filled 2022-10-18: qty 1
  Filled 2022-10-18 (×2): qty 2

## 2022-10-18 MED ORDER — SUGAMMADEX SODIUM 200 MG/2ML IV SOLN
INTRAVENOUS | Status: DC | PRN
  Administered 2022-10-18: 200 mg via INTRAVENOUS

## 2022-10-18 MED ORDER — HYDROMORPHONE HCL 1 MG/ML IJ SOLN
0.2000 mg | INTRAMUSCULAR | Status: DC | PRN
  Administered 2022-10-18: 0.5 mg via INTRAVENOUS
  Filled 2022-10-18 (×2): qty 1

## 2022-10-18 MED ORDER — PINDOLOL 5 MG PO TABS
5.0000 mg | ORAL_TABLET | Freq: Every day | ORAL | Status: DC
  Administered 2022-10-18: 5 mg via ORAL
  Filled 2022-10-18 (×3): qty 1

## 2022-10-18 MED ORDER — DOCUSATE SODIUM 100 MG PO CAPS
100.0000 mg | ORAL_CAPSULE | Freq: Two times a day (BID) | ORAL | Status: DC
  Administered 2022-10-18 – 2022-10-19 (×3): 100 mg via ORAL
  Filled 2022-10-18 (×3): qty 1

## 2022-10-18 MED ORDER — BUPIVACAINE HCL (PF) 0.25 % IJ SOLN
INTRAMUSCULAR | Status: AC
  Filled 2022-10-18: qty 30

## 2022-10-18 MED ORDER — ONDANSETRON HCL 4 MG/2ML IJ SOLN
INTRAMUSCULAR | Status: DC | PRN
  Administered 2022-10-18: 4 mg via INTRAVENOUS

## 2022-10-18 MED ORDER — MENTHOL 3 MG MT LOZG: 1.0000 | LOZENGE | OROMUCOSAL | Status: DC | PRN

## 2022-10-18 MED ORDER — PHENYLEPHRINE 80 MCG/ML (10ML) SYRINGE FOR IV PUSH (FOR BLOOD PRESSURE SUPPORT)
PREFILLED_SYRINGE | INTRAVENOUS | Status: AC
  Filled 2022-10-18: qty 10

## 2022-10-18 MED ORDER — CHLORHEXIDINE GLUCONATE 0.12 % MT SOLN: 15.0000 mL | Freq: Once | OROMUCOSAL | Status: AC

## 2022-10-18 MED ORDER — ORAL CARE MOUTH RINSE: 15.0000 mL | Freq: Once | OROMUCOSAL | Status: AC

## 2022-10-18 MED ORDER — ESTRADIOL 0.1 MG/GM VA CREA
TOPICAL_CREAM | VAGINAL | Status: AC
  Filled 2022-10-18: qty 42.5

## 2022-10-18 MED ORDER — KETOROLAC TROMETHAMINE 30 MG/ML IJ SOLN
INTRAMUSCULAR | Status: DC | PRN
  Administered 2022-10-18: 30 mg via INTRAVENOUS

## 2022-10-18 MED ORDER — MIDAZOLAM HCL 2 MG/2ML IJ SOLN
INTRAMUSCULAR | Status: AC
  Filled 2022-10-18: qty 2

## 2022-10-18 MED ORDER — ONDANSETRON HCL 4 MG/2ML IJ SOLN
INTRAMUSCULAR | Status: AC
  Filled 2022-10-18: qty 4

## 2022-10-18 MED ORDER — VANCOMYCIN HCL 1250 MG/250ML IV SOLN
1250.0000 mg | Freq: Once | INTRAVENOUS | Status: DC
  Filled 2022-10-18: qty 250

## 2022-10-18 MED ORDER — RISPERIDONE 0.5 MG PO TABS
0.5000 mg | ORAL_TABLET | ORAL | Status: DC
  Administered 2022-10-18 – 2022-10-20 (×4): 0.5 mg via ORAL
  Filled 2022-10-18 (×4): qty 1

## 2022-10-18 MED ORDER — SODIUM CHLORIDE 0.9 % IV SOLN
2.0000 g | INTRAVENOUS | Status: AC
  Administered 2022-10-18: 2 g via INTRAVENOUS

## 2022-10-18 MED ORDER — DEXAMETHASONE SODIUM PHOSPHATE 10 MG/ML IJ SOLN
INTRAMUSCULAR | Status: DC | PRN
  Administered 2022-10-18: 10 mg via INTRAVENOUS

## 2022-10-18 MED ORDER — LIDOCAINE 2% (20 MG/ML) 5 ML SYRINGE
INTRAMUSCULAR | Status: AC
  Filled 2022-10-18: qty 5

## 2022-10-18 MED ORDER — OXYCODONE HCL 5 MG/5ML PO SOLN: 5.0000 mg | Freq: Once | ORAL | Status: DC | PRN

## 2022-10-18 MED ORDER — ROCURONIUM BROMIDE 10 MG/ML (PF) SYRINGE
PREFILLED_SYRINGE | INTRAVENOUS | Status: AC
  Filled 2022-10-18: qty 10

## 2022-10-18 MED ORDER — VENLAFAXINE HCL ER 75 MG PO CP24
225.0000 mg | ORAL_CAPSULE | Freq: Every day | ORAL | Status: DC
  Administered 2022-10-18 – 2022-10-20 (×3): 225 mg via ORAL
  Filled 2022-10-18 (×3): qty 1

## 2022-10-18 MED ORDER — FAMOTIDINE 20 MG PO TABS
20.0000 mg | ORAL_TABLET | Freq: Two times a day (BID) | ORAL | Status: DC
  Administered 2022-10-18 – 2022-10-19 (×4): 20 mg via ORAL
  Filled 2022-10-18 (×4): qty 1

## 2022-10-18 MED ORDER — FENTANYL CITRATE (PF) 250 MCG/5ML IJ SOLN
INTRAMUSCULAR | Status: DC | PRN
  Administered 2022-10-18 (×7): 50 ug via INTRAVENOUS

## 2022-10-18 MED ORDER — CHLORHEXIDINE GLUCONATE 0.12 % MT SOLN
OROMUCOSAL | Status: AC
  Administered 2022-10-18: 15 mL via OROMUCOSAL
  Filled 2022-10-18: qty 15

## 2022-10-18 MED ORDER — IBUPROFEN 600 MG PO TABS
600.0000 mg | ORAL_TABLET | Freq: Four times a day (QID) | ORAL | Status: DC
  Administered 2022-10-18 – 2022-10-20 (×7): 600 mg via ORAL
  Filled 2022-10-18 (×7): qty 1

## 2022-10-18 MED ORDER — DOXEPIN HCL 25 MG PO CAPS
25.0000 mg | ORAL_CAPSULE | Freq: Every day | ORAL | Status: DC
  Administered 2022-10-18 – 2022-10-19 (×2): 25 mg via ORAL
  Filled 2022-10-18 (×2): qty 1

## 2022-10-18 MED ORDER — KETOROLAC TROMETHAMINE 30 MG/ML IJ SOLN
INTRAMUSCULAR | Status: AC
  Filled 2022-10-18: qty 1

## 2022-10-18 MED ORDER — PROPOFOL 10 MG/ML IV BOLUS
INTRAVENOUS | Status: AC
  Filled 2022-10-18: qty 20

## 2022-10-18 MED ORDER — PINDOLOL 5 MG PO TABS: 5.0000 mg | ORAL_TABLET | Freq: Every day | ORAL | Status: DC

## 2022-10-18 MED ORDER — BUPIVACAINE-EPINEPHRINE (PF) 0.5% -1:200000 IJ SOLN
INTRAMUSCULAR | Status: AC
  Filled 2022-10-18: qty 30

## 2022-10-18 MED ORDER — ACETAMINOPHEN 160 MG/5ML PO SOLN: 1000.0000 mg | Freq: Once | ORAL | Status: DC | PRN

## 2022-10-18 MED ORDER — ACETAMINOPHEN 325 MG PO TABS
650.0000 mg | ORAL_TABLET | ORAL | Status: DC | PRN
  Administered 2022-10-19: 650 mg via ORAL
  Filled 2022-10-18: qty 2

## 2022-10-18 MED ORDER — FENTANYL CITRATE (PF) 100 MCG/2ML IJ SOLN
25.0000 ug | INTRAMUSCULAR | Status: DC | PRN
  Administered 2022-10-18 (×2): 25 ug via INTRAVENOUS

## 2022-10-18 MED ORDER — ACETAMINOPHEN 10 MG/ML IV SOLN: 1000.0000 mg | Freq: Once | INTRAVENOUS | Status: DC | PRN

## 2022-10-18 MED ORDER — FENTANYL CITRATE (PF) 100 MCG/2ML IJ SOLN
INTRAMUSCULAR | Status: AC
  Filled 2022-10-18: qty 2

## 2022-10-18 MED ORDER — ACETAMINOPHEN 10 MG/ML IV SOLN
INTRAVENOUS | Status: DC | PRN
  Administered 2022-10-18: 1000 mg via INTRAVENOUS

## 2022-10-18 MED ORDER — PHENYLEPHRINE 80 MCG/ML (10ML) SYRINGE FOR IV PUSH (FOR BLOOD PRESSURE SUPPORT)
PREFILLED_SYRINGE | INTRAVENOUS | Status: DC | PRN
  Administered 2022-10-18 (×2): 80 ug via INTRAVENOUS

## 2022-10-18 MED ORDER — OXYCODONE HCL 5 MG PO TABS: 5.0000 mg | ORAL_TABLET | Freq: Once | ORAL | Status: DC | PRN

## 2022-10-18 MED ORDER — DEXAMETHASONE SODIUM PHOSPHATE 10 MG/ML IJ SOLN
INTRAMUSCULAR | Status: AC
  Filled 2022-10-18: qty 1

## 2022-10-18 MED ORDER — SODIUM CHLORIDE 0.9 % IV SOLN
INTRAVENOUS | Status: AC
  Filled 2022-10-18: qty 2

## 2022-10-18 MED ORDER — HYDROMORPHONE HCL 2 MG PO TABS: 2.0000 mg | ORAL_TABLET | ORAL | Status: DC | PRN

## 2022-10-18 MED ORDER — BUDESONIDE 3 MG PO CPEP
3.0000 mg | ORAL_CAPSULE | Freq: Every day | ORAL | Status: DC
  Administered 2022-10-18 – 2022-10-19 (×2): 3 mg via ORAL
  Filled 2022-10-18 (×3): qty 1

## 2022-10-18 SURGICAL SUPPLY — 41 items
BLADE SURG 10 STRL SS (BLADE) ×1 IMPLANT
BLADE SURG 15 STRL LF DISP TIS (BLADE) ×10 IMPLANT
BLADE SURG 15 STRL SS (BLADE) ×3
COVER BACK TABLE 80X110 HD (DRAPES) ×1 IMPLANT
DRSG OPSITE POSTOP 3X4 (GAUZE/BANDAGES/DRESSINGS) IMPLANT
DRSG OPSITE POSTOP 4X8 (GAUZE/BANDAGES/DRESSINGS) ×1 IMPLANT
DURAPREP 26ML APPLICATOR (WOUND CARE) ×4 IMPLANT
GAUZE 4X4 16PLY ~~LOC~~+RFID DBL (SPONGE) ×1 IMPLANT
GLOVE BIO SURGEON STRL SZ 6.5 (GLOVE) ×5 IMPLANT
GLOVE BIOGEL PI IND STRL 7.0 (GLOVE) ×9 IMPLANT
GLOVE SURG SS PI 7.0 STRL IVOR (GLOVE) ×1 IMPLANT
GOWN STRL REUS W/ TWL LRG LVL3 (GOWN DISPOSABLE) ×18 IMPLANT
GOWN STRL REUS W/TWL LRG LVL3 (GOWN DISPOSABLE) ×18
HIBICLENS CHG 4% 4OZ BTL (MISCELLANEOUS) ×1 IMPLANT
KIT TURNOVER KIT B (KITS) ×4 IMPLANT
LEGGING LITHOTOMY PAIR STRL (DRAPES) ×1 IMPLANT
MARKER SKIN DUAL TIP RULER LAB (MISCELLANEOUS) ×1 IMPLANT
NDL SPNL 22GX3.5 QUINCKE BK (NEEDLE) IMPLANT
NEEDLE SPNL 22GX3.5 QUINCKE BK (NEEDLE) ×3 IMPLANT
NS IRRIG 1000ML POUR BTL (IV SOLUTION) ×4 IMPLANT
PACK VAGINAL WOMENS (CUSTOM PROCEDURE TRAY) ×4 IMPLANT
PAD OB MATERNITY 4.3X12.25 (PERSONAL CARE ITEMS) ×1 IMPLANT
PENCIL SMOKE EVACUATOR (MISCELLANEOUS) ×1 IMPLANT
SPECIMEN JAR MEDIUM (MISCELLANEOUS) IMPLANT
SPONGE T-LAP 18X18 ~~LOC~~+RFID (SPONGE) ×2 IMPLANT
SUT PROLENE 1 CT 1 30 (SUTURE) IMPLANT
SUT VIC AB 0 CT1 18XCR BRD8 (SUTURE) ×3 IMPLANT
SUT VIC AB 0 CT1 27 (SUTURE) ×15
SUT VIC AB 0 CT1 27XBRD ANBCTR (SUTURE) ×17 IMPLANT
SUT VIC AB 0 CT1 8-18 (SUTURE) ×9
SUT VIC AB 2-0 SH 18 (SUTURE) ×1 IMPLANT
SUT VIC AB 2-0 UR5 27 (SUTURE) ×10 IMPLANT
SUT VIC AB 3-0 SH 27 (SUTURE) ×3
SUT VIC AB 3-0 SH 27X BRD (SUTURE) ×1 IMPLANT
SUT VIC AB 4-0 KS 27 (SUTURE) ×1 IMPLANT
SUT VICRYL 0 TIES 12 18 (SUTURE) ×2 IMPLANT
TOWEL GREEN STERILE FF (TOWEL DISPOSABLE) ×8 IMPLANT
TRAY FOLEY W/BAG SLVR 14FR (SET/KITS/TRAYS/PACK) ×4 IMPLANT
TUBE CONNECTING 12X1/4 (SUCTIONS) ×1 IMPLANT
UNDERPAD 30X36 HEAVY ABSORB (UNDERPADS AND DIAPERS) ×1 IMPLANT
YANKAUER SUCT BULB TIP NO VENT (SUCTIONS) ×1 IMPLANT

## 2022-10-18 NOTE — Transfer of Care (Signed)
Immediate Anesthesia Transfer of Care Note  Patient: Victoria [name]  Procedure(s) Performed: LAPAROTOMY WITH BILATERAL SALPINGO OOPHORECTOMY (Bilateral: Abdomen) ANTERIOR (CYSTOCELE) (Vagina ) HYSTERECTOMY VAGINAL (Vagina )  Patient Location: PACU  Anesthesia Type:General  Level of Consciousness: drowsy, patient cooperative, and responds to stimulation  Airway & Oxygen Therapy: Patient Spontanous Breathing and Patient connected to face mask oxygen  Post-op Assessment: Report given to RN and Post -op Vital signs reviewed and stable  Post vital signs: Reviewed and stable  Last Vitals:  Vitals Value Taken Time  BP 140/90 0944  Temp    Pulse 92   Resp 12   SpO2 99     Last Pain:  Vitals:   10/18/22 0623  TempSrc:   PainSc: 0-No pain         Complications: No notable events documented.

## 2022-10-18 NOTE — Anesthesia Procedure Notes (Signed)
Procedure Name: Intubation Date/Time: 10/18/2022 7:39 AM  Performed by: Lonia Mad, CRNAPre-anesthesia Checklist: Patient identified, Emergency Drugs available, Suction available and Patient being monitored Patient Re-evaluated:Patient Re-evaluated prior to induction Oxygen Delivery Method: Circle System Utilized Preoxygenation: Pre-oxygenation with 100% oxygen Induction Type: IV induction Ventilation: Mask ventilation without difficulty and Oral airway inserted - appropriate to patient size Laryngoscope Size: Mac and 3 Grade View: Grade III Tube type: Oral Tube size: 7.0 mm Number of attempts: 1 Airway Equipment and Method: Stylet and Oral airway Placement Confirmation: ETT inserted through vocal cords under direct vision, positive ETCO2 and breath sounds checked- equal and bilateral Secured at: 22 cm Tube secured with: Tape Dental Injury: Teeth and Oropharynx as per pre-operative assessment

## 2022-10-18 NOTE — Progress Notes (Signed)
Patient here today for vaginal hysterectomy, APR repair, BSO, possible laparotomy.  BP 110/68   Pulse 68   Temp 98.8 F (37.1 C) (Oral)   Resp 17   Ht 5\' 1"  (1.549 m)   Wt 71.7 kg   SpO2 99%   BMI 29.85 kg/m  No results found. However, due to the size of the patient record, not all encounters were searched. Please check Results Review for a complete set of results. H and P on the chart  Consent is signed  Questions are answered

## 2022-10-18 NOTE — Progress Notes (Signed)
Feels like she is in a lot of pain. BP 98/72 (BP Location: Left Arm)   Pulse 83   Temp (!) 97.2 F (36.2 C) (Axillary)   Resp 17   Ht 5\' 1"  (1.549 m)   Wt 71.7 kg   SpO2 100%   BMI 29.85 kg/m  Results for orders placed or performed during the hospital encounter of 10/18/22 (from the past 24 hour(s))  HIV Antibody (routine testing w rflx)     Status: None   Collection Time: 10/18/22  6:52 AM  Result Value Ref Range   HIV Screen 4th Generation wRfx Non Reactive Non Reactive  CBC     Status: Abnormal   Collection Time: 10/18/22  6:52 AM  Result Value Ref Range   WBC 4.4 4.0 - 10.5 K/uL   RBC 3.91 3.87 - 5.11 MIL/uL   Hemoglobin 11.8 (L) 12.0 - 15.0 g/dL   HCT 40.9 (L) 81.1 - 91.4 %   MCV 89.0 80.0 - 100.0 fL   MCH 30.2 26.0 - 34.0 pg   MCHC 33.9 30.0 - 36.0 g/dL   RDW 78.2 95.6 - 21.3 %   Platelets 236 150 - 400 K/uL   nRBC 0.0 0.0 - 0.2 %  RPR     Status: None   Collection Time: 10/18/22  6:52 AM  Result Value Ref Range   RPR Ser Ql NON REACTIVE NON REACTIVE   *Note: Due to a large number of results and/or encounters for the requested time period, some results have not been displayed. A complete set of results can be found in Results Review.   Urine output is great   Abdomen is slightly gassy  Incision - bandage is clean and dry  No vaginal bleeding noted Urine is clear   POD # 0  Add robaxin Remove foley in the am Ambulate  Advance diet

## 2022-10-18 NOTE — Brief Op Note (Signed)
10/18/2022  9:32 AM  PATIENT:  Victoria [name]  67 y.o. female  PRE-OPERATIVE DIAGNOSIS:  Pelvic organ prolapse PALB 2 genetic mutation Breast cancer   POST-OPERATIVE DIAGNOSIS:   Same  PROCEDURE:  Procedure(s): LAPAROTOMY WITH BILATERAL SALPINGO OOPHORECTOMY (Bilateral) ANTERIOR (CYSTOCELE) AND POSTERIOR REPAIR (RECTOCELE) (N/A) HYSTERECTOMY VAGINAL (N/A)  SURGEON:  Surgeon(s) and Role:    * Marcelle Overlie, MD - Primary    * Tawni Levy, MD - Assisting  PHYSICIAN ASSISTANT:   ASSISTANTS: none   ANESTHESIA:   general  EBL:  100 mL   BLOOD ADMINISTERED:none  DRAINS: Urinary Catheter (Foley)   LOCAL MEDICATIONS USED:  NONE  SPECIMEN:  Source of Specimen:  cervix uterus tubes and ovaries   DISPOSITION OF SPECIMEN:  PATHOLOGY  COUNTS:  YES  TOURNIQUET:  * No tourniquets in log *  DICTATION: .Other Dictation: Dictation Number dictated  PLAN OF CARE: Admit to inpatient   PATIENT DISPOSITION:  PACU - hemodynamically stable.   Delay start of Pharmacological VTE agent (>24hrs) due to surgical blood loss or risk of bleeding: not applicable

## 2022-10-19 ENCOUNTER — Encounter (HOSPITAL_COMMUNITY): Payer: Self-pay | Admitting: Obstetrics and Gynecology

## 2022-10-19 LAB — CBC
HCT: 31.1 % — ABNORMAL LOW (ref 36.0–46.0)
Hemoglobin: 10.6 g/dL — ABNORMAL LOW (ref 12.0–15.0)
MCH: 30.2 pg (ref 26.0–34.0)
MCHC: 34.1 g/dL (ref 30.0–36.0)
MCV: 88.6 fL (ref 80.0–100.0)
Platelets: 223 10*3/uL (ref 150–400)
RBC: 3.51 MIL/uL — ABNORMAL LOW (ref 3.87–5.11)
RDW: 13.2 % (ref 11.5–15.5)
WBC: 7.5 10*3/uL (ref 4.0–10.5)
nRBC: 0 % (ref 0.0–0.2)

## 2022-10-19 NOTE — Progress Notes (Signed)
POD # 1  Still having some discomfort. Has not voided since catheter removed this am BP (!) 88/55 (BP Location: Right Arm)   Pulse 82   Temp 99.1 F (37.3 C) (Oral)   Resp 16   Ht 5\' 1"  (1.549 m)   Wt 71.7 kg   SpO2 97%   BMI 29.85 kg/m  Results for orders placed or performed during the hospital encounter of 10/18/22 (from the past 24 hour(s))  CBC     Status: Abnormal   Collection Time: 10/19/22  5:27 AM  Result Value Ref Range   WBC 7.5 4.0 - 10.5 K/uL   RBC 3.51 (L) 3.87 - 5.11 MIL/uL   Hemoglobin 10.6 (L) 12.0 - 15.0 g/dL   HCT 40.9 (L) 81.1 - 91.4 %   MCV 88.6 80.0 - 100.0 fL   MCH 30.2 26.0 - 34.0 pg   MCHC 34.1 30.0 - 36.0 g/dL   RDW 78.2 95.6 - 21.3 %   Platelets 223 150 - 400 K/uL   nRBC 0.0 0.0 - 0.2 %   *Note: Due to a large number of results and/or encounters for the requested time period, some results have not been displayed. A complete set of results can be found in Results Review.   Abdomen honeycomb is dry  Some bruising above and below incision  No vaginal bleeding  POD # 1  Ambulate Advance diet  Ice pack to incision today Probable discharge home tomorrow

## 2022-10-20 NOTE — Plan of Care (Signed)
  Problem: Education: Goal: Knowledge of the prescribed therapeutic regimen will improve Outcome: Adequate for Discharge Goal: Understanding of sexual limitations or changes related to disease process or condition will improve Outcome: Adequate for Discharge Goal: Individualized Educational Video(s) Outcome: Adequate for Discharge   Problem: Self-Concept: Goal: Communication of feelings regarding changes in body function or appearance will improve Outcome: Adequate for Discharge   Problem: Skin Integrity: Goal: Demonstration of wound healing without infection will improve Outcome: Adequate for Discharge   Problem: Education: Goal: Knowledge of General Education information will improve Description: Including pain rating scale, medication(s)/side effects and non-pharmacologic comfort measures Outcome: Adequate for Discharge   Problem: Health Behavior/Discharge Planning: Goal: Ability to manage health-related needs will improve Outcome: Adequate for Discharge   Problem: Clinical Measurements: Goal: Ability to maintain clinical measurements within normal limits will improve Outcome: Adequate for Discharge Goal: Will remain free from infection Outcome: Adequate for Discharge Goal: Diagnostic test results will improve Outcome: Adequate for Discharge Goal: Respiratory complications will improve Outcome: Adequate for Discharge Goal: Cardiovascular complication will be avoided Outcome: Adequate for Discharge   Problem: Activity: Goal: Risk for activity intolerance will decrease Outcome: Adequate for Discharge   Problem: Nutrition: Goal: Adequate nutrition will be maintained Outcome: Adequate for Discharge   Problem: Coping: Goal: Level of anxiety will decrease Outcome: Adequate for Discharge   Problem: Elimination: Goal: Will not experience complications related to bowel motility Outcome: Adequate for Discharge Goal: Will not experience complications related to urinary  retention Outcome: Adequate for Discharge   Problem: Pain Managment: Goal: General experience of comfort will improve Outcome: Adequate for Discharge   Problem: Safety: Goal: Ability to remain free from injury will improve Outcome: Adequate for Discharge   Problem: Skin Integrity: Goal: Risk for impaired skin integrity will decrease Outcome: Adequate for Discharge   

## 2022-10-20 NOTE — Anesthesia Postprocedure Evaluation (Signed)
Anesthesia Post Note  Patient: Victoria [name]  Procedure(s) Performed: LAPAROTOMY WITH BILATERAL SALPINGO OOPHORECTOMY (Bilateral: Abdomen) ANTERIOR (CYSTOCELE) (Vagina ) HYSTERECTOMY VAGINAL (Vagina )     Patient location during evaluation: PACU Anesthesia Type: General Level of consciousness: awake and alert Pain management: pain level controlled Vital Signs Assessment: post-procedure vital signs reviewed and stable Respiratory status: spontaneous breathing, nonlabored ventilation and respiratory function stable Cardiovascular status: blood pressure returned to baseline and stable Postop Assessment: no apparent nausea or vomiting Anesthetic complications: no   No notable events documented.  Last Vitals:  Vitals:   10/20/22 0451 10/20/22 0837  BP: (!) 111/55 113/65  Pulse: 79 94  Resp: 18 17  Temp: 36.7 C 36.9 C  SpO2: 100% 99%    Last Pain:  Vitals:   10/20/22 0837  TempSrc: Oral  PainSc: 3                  Zacari Stiff

## 2022-10-20 NOTE — Discharge Summary (Signed)
Admission Diagnosis: Pelvic Organ Prolapse Genetic Mutation  Discharge Diagnosis: Same  Hospital Course: 67 year old female admitted for Vag hysterectomy, Anterior repair Laparotomy and BSO. She did very well post op. By POD # 2 she was ambulating, had no pain, and voiding and tolerating regular diet. She was discharged home in excellent condition on POD # 2   BP (!) 111/55 (BP Location: Right Arm)   Pulse 79   Temp 98.1 F (36.7 C) (Oral)   Resp 18   Ht 5\' 1"  (1.549 m)   Wt 71.7 kg   SpO2 100%   BMI 29.85 kg/m  No results found. However, due to the size of the patient record, not all encounters were searched. Please check Results Review for a complete set of results. Abdomen is soft and non tender Bandage clean and dry  and intact   Discharge meds  Prior to Admission medications   Medication Sig Start Date End Date Taking? Authorizing Provider  acetaminophen (TYLENOL) 650 MG CR tablet Take 650 mg by mouth every 8 (eight) hours as needed for pain.   Yes [provider]  albuterol (VENTOLIN HFA) 108 (90 Base) MCG/ACT inhaler Inhale 1-2 puffs into the lungs every 6 (six) hours as needed. 03/07/22  Yes Kalman Shan, MD  ascorbic acid (VITAMIN C) 500 MG tablet Take 500 mg by mouth daily.   Yes [provider]  aspirin EC 81 MG tablet Take 81 mg by mouth daily. Swallow whole.   Yes [provider]  budesonide (ENTOCORT EC) 3 MG 24 hr capsule Take 1 capsule (3 mg total) by mouth daily. 08/26/22  Yes Pyrtle, Carie Caddy, MD  Calcium-Magnesium-Vitamin D (CALCIUM MAGNESIUM PO) Take 1 tablet by mouth at bedtime.   Yes [provider]  Cholecalciferol (VITAMIN D3 ULTRA STRENGTH) 125 MCG (5000 UT) capsule Take 5,000 Units by mouth daily.   Yes [provider]  clonazePAM (KLONOPIN) 0.5 MG tablet Take 1 tablet (0.5 mg total) by mouth daily. 10/26/22  Yes Stasia Cavalier, MD  doxepin (SINEQUAN) 25 MG capsule Take 1 capsule (25 mg total) by mouth at  bedtime. 08/18/22  Yes Sarina Ill, DO  Evolocumab (REPATHA SURECLICK) 140 MG/ML SOAJ Inject 140 mg into the skin every 21 ( twenty-one) days.   Yes [provider]  famotidine (PEPCID) 20 MG tablet TAKE 1 TABLET BY MOUTH TWICE A DAY 10/17/22  Yes Pyrtle, Carie Caddy, MD  ibuprofen (ADVIL) 200 MG tablet Take 400 mg by mouth every 6 (six) hours as needed for moderate pain.   Yes [provider]  nitroGLYCERIN (NITROSTAT) 0.4 MG SL tablet Place 1 tablet (0.4 mg total) under the tongue every 5 (five) minutes as needed for chest pain. 09/20/22  Yes Tereso Newcomer T, PA-C  pindolol (VISKEN) 5 MG tablet Take 1 tablet (5 mg total) by mouth daily after breakfast. 08/19/22  Yes Sarina Ill, DO  risperiDONE (RISPERDAL) 0.5 MG tablet Take 1 tablet (0.5 mg total) by mouth 2 (two) times daily at 8 am and 4 pm. 08/18/22  Yes Sarina Ill, DO  venlafaxine XR (EFFEXOR XR) 75 MG 24 hr capsule Take 3 capsules (225 mg total) by mouth daily with breakfast. 09/30/22  Yes Stasia Cavalier, MD  Benralizumab (FASENRA PEN) 30 MG/ML SOAJ Inject 1 mL (30 mg total) into the skin every 8 (eight) weeks. 07/26/21   Kalman Shan, MD  denosumab (PROLIA) 60 MG/ML SOSY injection Inject 60 mg into the skin every 6 (  six) months.    [provider]  ipratropium-albuterol (DUONEB) 0.5-2.5 (3) MG/3ML SOLN Take 3 mLs by nebulization every 6 (six) hours as needed. 03/07/22   Kalman Shan, MD   Follow up in 1 week Wound care and post op instructions reviewed with the patient

## 2022-10-21 ENCOUNTER — Telehealth: Payer: Self-pay | Admitting: *Deleted

## 2022-10-21 ENCOUNTER — Encounter: Payer: Self-pay | Admitting: *Deleted

## 2022-10-21 ENCOUNTER — Encounter: Payer: Self-pay | Admitting: General Practice

## 2022-10-21 NOTE — Transitions of Care (Post Inpatient/ED Visit) (Signed)
   10/21/2022  Name: Victoria [name] MRN: [id number] DOB: [birth year]  Today's TOC FU Call Status: Today's TOC FU Call Status:: Unsuccessul Call (1st Attempt) Unsuccessful Call (1st Attempt) Date: 10/21/22  Attempted to reach the patient regarding the most recent Inpatient visit; left HIPAA compliant voice message requesting call back  Follow Up Plan: Additional outreach attempts will be made to reach the patient to complete the Transitions of Care (Post Inpatient visit) call.   Caryl Pina, RN, BSN, CCRN Alumnus RN CM Care Coordination/ Transition of Care- [facility] [phone]: direct office

## 2022-10-21 NOTE — Op Note (Unsigned)
Victoria [name], Victoria [name] MEDICAL RECORD NO: [id number] ACCOUNT NO: [id number] DATE OF BIRTH: May 14, 1956 FACILITY: [facility] LOCATION: [facility] PHYSICIAN: Ajooni Karam L. Vincente Poli, MD  Operative Report   DATE OF PROCEDURE: 10/18/2022  PREOPERATIVE DIAGNOSES: 1.  Pelvic organ prolapse. 2.  PALB2 genetic mutation. 3.  Breast cancer.  POSTOPERATIVE DIAGNOSES: 1.  Pelvic organ prolapse. 2.  PALB2 genetic mutation. 3.  Breast cancer.  PROCEDURES PERFORMED:  1.  Vaginal hysterectomy. 2.  Anterior colporrhaphy. 3.  Laparotomy with bilateral salpingo-oophorectomy.  SURGEON:  Dr. Vincente Poli.  ASSISTANT:  Dr. Imogene Burn.  ANESTHESIA:  General.  ESTIMATED BLOOD LOSS:  100 mL.  COMPLICATIONS:  None.  DRAINS:  Foley catheter.  PATHOLOGY:  Pathology would be cervix, uterus, tubes and ovaries.  DESCRIPTION OF PROCEDURE:  The patient was taken to the operating room.  Her consent had been signed prior to going to the operating room.  She was intubated and placed in the low lithotomy position.  She was prepped and draped in the usual sterile  fashion.  The speculum was inserted and she had a grade 2-3 cervical uterine prolapse and grade 2 cystocele.  She had good support posteriorly and no gaping perineum.  A Foley catheter was inserted into her bladder.  After timeout was performed, a  weighted speculum was placed in the vagina.  A circumferential incision was made around the cervix.  The posterior cul-de-sac was entered sharply.  We then clamped with curved Heaney clamps, the uterosacral ligaments.  Each pedicle was clamped, cut,  suture ligated with 0 Vicryl suture.  We then carefully dissected the bladder away from the anterior surface of the cervix and entered the cul-de-sac anteriorly.  We then clamped the broad ligament, staying snug beside the cervix and uterus.  The uterus  was very small.  The cervix was elongated because of the prolapse.  Each pedicle was clamped, cut and suture ligated using 0 Vicryl suture.   We then reached the level of the triple pedicle and placed the remainder of the tissue.  I then clamped with the  curved Heaney clamp on either side and the pedicle was secured with a suture ligature and a free tie of 0 Vicryl suture.  At this point, hemostasis was very good.  My hope was that her ovaries and tubes would come down and that I would not have to do a  laparotomy.  However, the ovaries were way high in the pelvis.  We could not pull them down even with a Babcock clamp and so the decision was made that we would have to remove the ovaries with a mini laparotomy after we completed the vaginal portion of  the surgery because of her genetic mutation, which increases her risk of ovarian cancer and with a recent diagnosis of breast cancer as well.  So then we closed the posterior cuff in a running locked stitch.  We then proceeded with the anterior repair by  making a midline incision over the vaginal epithelium anteriorly, dissecting the bladder away from the overlying vaginal epithelium.  Once we did that, then we reduced the cystocele with a pursestring suture x 2 with 2-0 Vicryl suture.  We trimmed the  redundant vaginal epithelium and then we closed the anterior colporrhaphy in a running locked stitch using 2-0 Vicryl running locked stitch.  Hemostasis was very good.  We then closed the cuff completely using 0 Vicryl in a running locked stitch.  At  this point, I did a thorough inspection of the vagina  and she had excellent repair of the cystocele.  She had very good vaginal vault support.  She had no rectocele and her perineum had good tone, so felt like we had completed the vaginal portion of the  procedure.  Urine output was good and urine was clear. At this point, we changed our gown and gloves and then we proceeded to go to the abdomen.  We made a small minilaparotomy incision in the area of the previous C-section scar, but not using the entire  scar.  We carried it down to the fascia with  electrocautery, scored the fascia in the midline and extended it laterally.  We then entered the peritoneum bluntly and stretched the peritoneum and exploration revealed that she had a large amount of dense  omentum, possibly bowel stuck to her anterior abdominal wall around the umbilicus area and this is why I had not wanted to do a laparoscopy.  SHE SHOULD NEVER HAVE A LAPAROSCOPY AROUND THE UMBILICUS IN THE FUTURE BECAUSE OF THOSE DENSE ADHESIONS.   However, fortunately, we did not have adhesions in the pelvis.  We placed the patient in a gentle T-burg. We packed the large and small bowel in the upper abdomen and then we were able to locate her adnexa, which as I had suspected were both high and  flushed with the sidewall.  We started on the right side by grasping the ovary and tube with a Babcock, elevating it and placed a curved Heaney clamp behind the ovary and across the tube.  We then removed it and we are sending each tube and ovary  separate. The specimen was sent labeled.  The pedicle was secured with a free tie and a suture ligature of 0 Vicryl suture.  This was done on the left side in identical fashion.  Irrigation was performed.  All pedicles and vaginal cuff were inspected.   Hemostasis was very good.  We then removed all laparotomy pads from the abdominal cavity and then we closed the peritoneum using 0 Vicryl running stitch.  We then closed the fascia using 0 Vicryl running stitch x 2 starting in each corner and meeting in  the midline.  We then closed the subcutaneous using plain gut interrupteds and then closed the skin using 3-0 Vicryl on a Keith needle.  All sponge, lap and instrument counts were correct x 2.  A bandage was applied to her abdomen after Steri-Strips were  applied to the incision.  She was extubated and went to recovery room in stable condition.   MUK D: 10/21/2022 7:26:34 am T: 10/21/2022 9:46:00 am  JOB: [id number]/ [id number]

## 2022-10-21 NOTE — Progress Notes (Signed)
[facility] Psychosocial Distress Screening Spiritual Care  Met with Victoria [name] by phone following [facility] to introduce Support Center team/resources, reviewing distress screen per protocol.  The patient scored a  [unspecified]  on the Psychosocial Distress Thermometer which indicates  [unspecified]  distress. Also assessed for distress and other psychosocial needs.      10/21/2022   10:35 AM  ONCBCN DISTRESS SCREENING  Screening Type Initial Screening  Emotional problem type Depression;Nervousness/Anxiety;Adjusting to appearance changes  Referral to support programs Yes    Chaplain and patient discussed common feelings and emotions when being diagnosed with cancer, and the importance of support during treatment.  Chaplain informed patient of the support team and support services at [facility].  Chaplain provided contact information and encouraged patient to call with any questions or concerns.  Victoria [name] faith is a significant coping and meaning-making tool. She has a daily Training and development officer. She named that she had a "very dark period with mental health" in February and is "very grateful for healing that came quickly." In addition, she is recovering from a hysterectomy this week and is preparing for upcoming lumpectomy. 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 They like to plan small getaways and occasional trips to [facility] [place], [place]); Victoria [name] is hopeful that the latter half of the year will afford such opportunities.  Follow up needed: No. Victoria [name] knows to contact chaplain as needed/desired.   [address] [place], [place], [facility] Pager [phone] Voicemail [phone]

## 2022-10-24 ENCOUNTER — Telehealth (HOSPITAL_COMMUNITY): Payer: Self-pay | Admitting: *Deleted

## 2022-10-24 ENCOUNTER — Ambulatory Visit: Payer: PPO | Admitting: Internal Medicine

## 2022-10-24 ENCOUNTER — Telehealth: Payer: Self-pay | Admitting: *Deleted

## 2022-10-24 ENCOUNTER — Encounter: Payer: Self-pay | Admitting: *Deleted

## 2022-10-24 NOTE — Transitions of Care (Post Inpatient/ED Visit) (Signed)
10/24/2022  Name: LIBERTA DELAPLANE MRN: [id number] DOB: 10-Oct-1955  Today's TOC FU Call Status: Today's TOC FU Call Status:: Successful TOC FU Call Competed TOC FU Call Complete Date: 10/24/22  Transition Care Management Follow-up Telephone Call Date of Discharge: 10/20/22 Discharge Facility: [facility]) Type of Discharge: Inpatient Admission Primary Inpatient Discharge Diagnosis:: hysterectony; prolapse of pelvic organs with laparatomy/ bilateral salpingo oophrectomy How have you been since you were released from the hospital?: Better ("I am doing okay.  The pain isn't bad and I am able to do just about everything I need to on my own.  I will talk to the surgeon about this swelling after the surgery as you have advised.  My incision seems to be fine") Any questions or concerns?: Yes Patient Questions/Concerns:: "I have some swelling in my abdomen and in my feet after the surgery, it is not affecting my ability to walk around or anything, but I hope it will gradually get better; I plan to discuss with the surgeon tomorrow and if she recommends that I see my PCP, I will make an appointment then; I don't need an appointment with PCP otherwise accoridng to what the surgeon said when I was released from the hospital" Patient Questions/Concerns Addressed: Other: (provided general education around post-op swelling due to fluid administration- encouraged patient to attend specialist appointment as scheduled tomorrow; provided my direct number should needs/ questions/ concerns arise in future)  Items Reviewed: Did you receive and understand the discharge instructions provided?: Yes (thoroughly reviewed with patient who verbalizes good understanding of same) Medications obtained,verified, and reconciled?: Yes (Medications Reviewed) (Partial medication review completed; declined full review- confirmed patient had no new Rx'd medications post-recent surgery; self-manages  medications and denies questions/ concerns around medications today) Any new allergies since your discharge?: No Dietary orders reviewed?: Yes Type of Diet Ordered:: "Pretty much regular diet" Do you have support at home?: Yes People in Home: spouse Name of Support/Comfort Primary Source: Reports independent in self-care activities; supportive spouse assists as/ if needed/ indicated  Medications Reviewed Today: Medications Reviewed Today     Reviewed by Michaela Corner, RN (Registered Nurse) on 10/24/22 at [phone]  Med List Status: <None>   Medication Order Taking? Sig Documenting Provider Last Dose Status Informant  acetaminophen (TYLENOL) 650 MG CR tablet [id number]  Take 650 mg by mouth every 8 (eight) hours as needed for pain. [provider]  Active Self  albuterol (VENTOLIN HFA) 108 (90 Base) MCG/ACT inhaler [id number]  Inhale 1-2 puffs into the lungs every 6 (six) hours as needed. Kalman Shan, MD  Active Self  ascorbic acid (VITAMIN C) 500 MG tablet [id number]  Take 500 mg by mouth daily. [provider]  Active Self  aspirin EC 81 MG tablet [id number]  Take 81 mg by mouth daily. Swallow whole. [provider]  Active Self  Benralizumab (FASENRA PEN) 30 MG/ML Ivory Broad [id number]  Inject 1 mL (30 mg total) into the skin every 8 (eight) weeks. Kalman Shan, MD  Active Self  Calcium-Magnesium-Vitamin D (CALCIUM MAGNESIUM PO) [id number]  Take 1 tablet by mouth at bedtime. [provider]  Active Self  Cholecalciferol (VITAMIN D3 ULTRA STRENGTH) 125 MCG (5000 UT) capsule [id number]  Take 5,000 Units by mouth daily. [provider]  Active Self  clonazePAM (KLONOPIN) 0.5 MG tablet [id number]  Take 1 tablet (0.5 mg total) by mouth daily. Stasia Cavalier, MD  Active   denosumab [facility]) 60 MG/ML SOSY injection [id number]  Inject 60 mg into the skin every 6 (six) months. [provider]  Active Self  doxepin (SINEQUAN) 25 MG capsule [id number]   Take 1 capsule (25 mg total) by mouth at bedtime. Sarina Ill, DO  Active Self  Evolocumab (REPATHA SURECLICK) 140 MG/ML Ivory Broad [id number]  Inject 140 mg into the skin every 21 ( twenty-one) days. [provider]  Active Self  famotidine (PEPCID) 20 MG tablet [id number]  TAKE 1 TABLET BY MOUTH TWICE A DAY Pyrtle, Carie Caddy, MD  Active   ibuprofen (ADVIL) 200 MG tablet [id number]  Take 400 mg by mouth every 6 (six) hours as needed for moderate pain. [provider]  Active Self  ipratropium-albuterol (DUONEB) 0.5-2.5 (3) MG/3ML SOLN [id number]  Take 3 mLs by nebulization every 6 (six) hours as needed. Kalman Shan, MD  Active Self  nitroGLYCERIN (NITROSTAT) 0.4 MG SL tablet [id number]  Place 1 tablet (0.4 mg total) under the tongue every 5 (five) minutes as needed for chest pain. Tereso Newcomer T, PA-C  Active Self  pindolol (VISKEN) 5 MG tablet [id number]  Take 1 tablet (5 mg total) by mouth daily after breakfast. Sarina Ill, DO  Active Self           Med Note Garner Nash, MICHELE   Tue Sep 20, 2022  3:36 PM)    risperiDONE (RISPERDAL) 0.5 MG tablet [id number]  Take 1 tablet (0.5 mg total) by mouth 2 (two) times daily at 8 am and 4 pm. Sarina Ill, DO  Active Self  venlafaxine XR (EFFEXOR XR) 75 MG 24 hr capsule [id number]  Take 3 capsules (225 mg total) by mouth daily with breakfast. Stasia Cavalier, MD  Active Self            Home Care and Equipment/Supplies: Were Home Health Services Ordered?: No Any new equipment or medical supplies ordered?: No  Functional Questionnaire: Do you need assistance with bathing/showering or dressing?: No Do you need assistance with meal preparation?: No Do you need assistance with eating?: No Do you have difficulty maintaining continence: No Do you need assistance with getting out of bed/getting out of a chair/moving?: No Do you have difficulty managing or taking your medications?: No  Follow up appointments  reviewed: PCP Follow-up appointment confirmed?: NA (verified not indicated per hospital discharging provider discharge notes) Specialist Hospital Follow-up appointment confirmed?: Yes Date of Specialist follow-up appointment?: 10/25/22 Follow-Up Specialty Provider:: Gynecology Surgical provider Do you need transportation to your follow-up appointment?: No Do you understand care options if your condition(s) worsen?: Yes-patient verbalized understanding  SDOH Interventions Today    Flowsheet Row Most Recent Value  SDOH Interventions   Food Insecurity Interventions Intervention Not Indicated  Transportation Interventions Intervention Not Indicated  [normally drives self,  husband providing transporation post-recent surgery]      TOC Interventions Today    Flowsheet Row Most Recent Value  TOC Interventions   TOC Interventions Discussed/Reviewed TOC Interventions Discussed, Post op wound/incision care, S/S of infection  [Patient declines need for ongoing/ further care coordination outreach,  no care coordination needs identified at time of TOC call today,  provided my direct contact information should questions/ concerns/ needs arise post-TOC call]      Interventions Today    Flowsheet Row Most Recent Value  Chronic Disease   Chronic disease during today's visit Other  [surgical hysterectomy with bilateral salpingo oophorectomy]  General Interventions   General Interventions Discussed/Reviewed General Interventions Discussed, Doctor Visits  Doctor Visits Discussed/Reviewed PCP, Specialist, Doctor  Visits Discussed  PCP/Specialist Visits Compliance with follow-up visit  Education Interventions   Education Provided Provided Education  Provided Verbal Education On When to see the doctor, Medication, Other  [incision monitoring/ care at home]  Nutrition Interventions   Nutrition Discussed/Reviewed Nutrition Discussed  Pharmacy Interventions   Pharmacy Dicussed/Reviewed Pharmacy Topics  Discussed      Caryl Pina, RN, BSN, CCRN Alumnus RN CM Care Coordination/ Transition of Care- [facility] Care Management [phone]: direct office

## 2022-10-24 NOTE — Telephone Encounter (Signed)
Pt called to advise that she has had to d/c the Pindolol 5 mg QD due to hypotension when she was in hospital for recent surgery. Medication was prescribed when she was at [facility] on 08/12/22, to start on 08/19/22. Pt says her BP was "in the 70's over the 40's". Pt states that she doesn't see any discernable difference and is asking if it's ok with you to discontinue med totally. Pt has a scheduled f/u on 11/15/22. Please review.

## 2022-10-26 LAB — SURGICAL PATHOLOGY

## 2022-10-28 ENCOUNTER — Other Ambulatory Visit: Payer: Self-pay

## 2022-10-28 ENCOUNTER — Encounter (HOSPITAL_BASED_OUTPATIENT_CLINIC_OR_DEPARTMENT_OTHER): Payer: Self-pay | Admitting: General Surgery

## 2022-10-31 ENCOUNTER — Telehealth: Payer: Self-pay

## 2022-10-31 NOTE — Telephone Encounter (Signed)
Prolia VOB initiated via [email or web address] 

## 2022-11-02 ENCOUNTER — Other Ambulatory Visit (HOSPITAL_COMMUNITY): Payer: Self-pay

## 2022-11-02 NOTE — Telephone Encounter (Signed)
Pt ready for scheduling for Prolia on or after : 11/25/22  Out-of-pocket cost due at time of visit: $302  Primary: [employer] Prolia co-insurance: 20% Admin fee co-insurance: $0  Secondary: N/A Prolia co-insurance:  Admin fee co-insurance:   Medical Benefit Details: Date Benefits were checked: 11/02/22 Deductible: no/ Coinsurance: 20%/ Admin Fee: $0  Prior Auth: no PA# Expiration Date:    Pharmacy benefit: Copay $200 If patient wants fill through the pharmacy benefit please send prescription to:  [facility] , and include estimated need by date in rx notes. Pharmacy will ship medication directly to the office.  Patient not eligible for Prolia Copay Card. Copay Card can make patient's cost as little as $25. Link to apply: [email or web address]://[email or web address]  ** This summary of benefits is an estimation of the patient's out-of-pocket cost. Exact cost may very based on individual plan coverage.

## 2022-11-02 NOTE — Telephone Encounter (Signed)
Called pt regarding Prolia was advised appt was made and advised  Of the out of pocket due at the visit 302 pt stated understand. Pt ready for scheduling for Prolia on or after : 11/25/22   Out-of-pocket cost due at time of visit: $302   Primary: [employer] Prolia co-insurance: 20% Admin fee co-insurance: $0   Secondary: N/A Prolia co-insurance:  Admin fee co-insurance:    Medical Benefit Details: Date Benefits were checked: 11/02/22 Deductible: no/ Coinsurance: 20%/ Admin Fee: $0   Prior Auth: no PA# Expiration Date:

## 2022-11-03 ENCOUNTER — Ambulatory Visit
Admission: RE | Admit: 2022-11-03 | Discharge: 2022-11-03 | Disposition: A | Discharge status: Home / Self Care | Payer: PPO | Source: Ambulatory Visit | Attending: General Surgery | Admitting: General Surgery

## 2022-11-03 DIAGNOSIS — D0512 Intraductal carcinoma in situ of left breast: Secondary | ICD-10-CM | POA: Diagnosis not present

## 2022-11-03 DIAGNOSIS — C50512 Malignant neoplasm of lower-outer quadrant of left female breast: Secondary | ICD-10-CM

## 2022-11-03 HISTORY — PX: BREAST BIOPSY: SHX20

## 2022-11-03 MED ORDER — CHLORHEXIDINE GLUCONATE CLOTH 2 % EX PADS: 6.0000 | MEDICATED_PAD | Freq: Once | CUTANEOUS | Status: DC

## 2022-11-03 NOTE — Progress Notes (Signed)

## 2022-11-04 ENCOUNTER — Other Ambulatory Visit: Payer: Self-pay

## 2022-11-04 ENCOUNTER — Ambulatory Visit (HOSPITAL_BASED_OUTPATIENT_CLINIC_OR_DEPARTMENT_OTHER)
Admission: RE | Admit: 2022-11-04 | Discharge: 2022-11-04 | Disposition: A | Discharge status: Home / Self Care | Payer: PPO | Attending: General Surgery | Admitting: General Surgery

## 2022-11-04 ENCOUNTER — Encounter (HOSPITAL_BASED_OUTPATIENT_CLINIC_OR_DEPARTMENT_OTHER)
Admission: RE | Disposition: A | Discharge status: Home / Self Care | Payer: Self-pay | Source: Home / Self Care | Attending: General Surgery

## 2022-11-04 ENCOUNTER — Ambulatory Visit (HOSPITAL_BASED_OUTPATIENT_CLINIC_OR_DEPARTMENT_OTHER): Payer: PPO | Admitting: Anesthesiology

## 2022-11-04 ENCOUNTER — Ambulatory Visit
Admission: RE | Admit: 2022-11-04 | Discharge: 2022-11-04 | Disposition: A | Discharge status: Home / Self Care | Payer: PPO | Source: Ambulatory Visit | Attending: General Surgery | Admitting: General Surgery

## 2022-11-04 ENCOUNTER — Encounter (HOSPITAL_BASED_OUTPATIENT_CLINIC_OR_DEPARTMENT_OTHER): Payer: Self-pay | Admitting: General Surgery

## 2022-11-04 DIAGNOSIS — I2510 Atherosclerotic heart disease of native coronary artery without angina pectoris: Secondary | ICD-10-CM | POA: Insufficient documentation

## 2022-11-04 DIAGNOSIS — G4733 Obstructive sleep apnea (adult) (pediatric): Secondary | ICD-10-CM | POA: Diagnosis not present

## 2022-11-04 DIAGNOSIS — C50512 Malignant neoplasm of lower-outer quadrant of left female breast: Secondary | ICD-10-CM | POA: Insufficient documentation

## 2022-11-04 DIAGNOSIS — Z803 Family history of malignant neoplasm of breast: Secondary | ICD-10-CM | POA: Insufficient documentation

## 2022-11-04 DIAGNOSIS — F418 Other specified anxiety disorders: Secondary | ICD-10-CM | POA: Diagnosis not present

## 2022-11-04 DIAGNOSIS — K219 Gastro-esophageal reflux disease without esophagitis: Secondary | ICD-10-CM | POA: Insufficient documentation

## 2022-11-04 DIAGNOSIS — K449 Diaphragmatic hernia without obstruction or gangrene: Secondary | ICD-10-CM | POA: Diagnosis not present

## 2022-11-04 DIAGNOSIS — F32A Depression, unspecified: Secondary | ICD-10-CM | POA: Insufficient documentation

## 2022-11-04 DIAGNOSIS — Z9889 Other specified postprocedural states: Secondary | ICD-10-CM | POA: Diagnosis not present

## 2022-11-04 DIAGNOSIS — I5032 Chronic diastolic (congestive) heart failure: Secondary | ICD-10-CM | POA: Insufficient documentation

## 2022-11-04 DIAGNOSIS — I509 Heart failure, unspecified: Secondary | ICD-10-CM | POA: Diagnosis not present

## 2022-11-04 DIAGNOSIS — F419 Anxiety disorder, unspecified: Secondary | ICD-10-CM | POA: Diagnosis not present

## 2022-11-04 DIAGNOSIS — Z170 Estrogen receptor positive status [ER+]: Secondary | ICD-10-CM | POA: Diagnosis not present

## 2022-11-04 DIAGNOSIS — G4730 Sleep apnea, unspecified: Secondary | ICD-10-CM | POA: Diagnosis not present

## 2022-11-04 DIAGNOSIS — C50912 Malignant neoplasm of unspecified site of left female breast: Secondary | ICD-10-CM

## 2022-11-04 DIAGNOSIS — J45909 Unspecified asthma, uncomplicated: Secondary | ICD-10-CM | POA: Insufficient documentation

## 2022-11-04 HISTORY — PX: BREAST LUMPECTOMY WITH RADIOACTIVE SEED LOCALIZATION: SHX6424

## 2022-11-04 SURGERY — BREAST LUMPECTOMY WITH RADIOACTIVE SEED LOCALIZATION
Anesthesia: General | Site: Breast | Laterality: Left

## 2022-11-04 MED ORDER — BUPIVACAINE-EPINEPHRINE (PF) 0.25% -1:200000 IJ SOLN
INTRAMUSCULAR | Status: DC | PRN
  Administered 2022-11-04: 20 mL

## 2022-11-04 MED ORDER — PHENYLEPHRINE HCL (PRESSORS) 10 MG/ML IV SOLN
INTRAVENOUS | Status: DC | PRN
  Administered 2022-11-04: 80 ug via INTRAVENOUS

## 2022-11-04 MED ORDER — ONDANSETRON HCL 4 MG/2ML IJ SOLN
INTRAMUSCULAR | Status: AC
  Filled 2022-11-04: qty 2

## 2022-11-04 MED ORDER — CEFAZOLIN SODIUM-DEXTROSE 2-4 GM/100ML-% IV SOLN
INTRAVENOUS | Status: AC
  Filled 2022-11-04: qty 100

## 2022-11-04 MED ORDER — DEXAMETHASONE SODIUM PHOSPHATE 10 MG/ML IJ SOLN
INTRAMUSCULAR | Status: AC
  Filled 2022-11-04: qty 1

## 2022-11-04 MED ORDER — ATROPINE SULFATE 0.4 MG/ML IV SOLN
INTRAVENOUS | Status: AC
  Filled 2022-11-04: qty 1

## 2022-11-04 MED ORDER — GABAPENTIN 100 MG PO CAPS
ORAL_CAPSULE | ORAL | Status: AC
  Filled 2022-11-04: qty 1

## 2022-11-04 MED ORDER — SUCCINYLCHOLINE CHLORIDE 200 MG/10ML IV SOSY
PREFILLED_SYRINGE | INTRAVENOUS | Status: AC
  Filled 2022-11-04: qty 10

## 2022-11-04 MED ORDER — MIDAZOLAM HCL 5 MG/5ML IJ SOLN
INTRAMUSCULAR | Status: DC | PRN
  Administered 2022-11-04: 1 mg via INTRAVENOUS

## 2022-11-04 MED ORDER — EPHEDRINE 5 MG/ML INJ
INTRAVENOUS | Status: AC
  Filled 2022-11-04: qty 5

## 2022-11-04 MED ORDER — DEXAMETHASONE SODIUM PHOSPHATE 4 MG/ML IJ SOLN
INTRAMUSCULAR | Status: DC | PRN
  Administered 2022-11-04: 4 mg via INTRAVENOUS

## 2022-11-04 MED ORDER — LIDOCAINE HCL (CARDIAC) PF 100 MG/5ML IV SOSY
PREFILLED_SYRINGE | INTRAVENOUS | Status: DC | PRN
  Administered 2022-11-04: 100 mg via INTRAVENOUS

## 2022-11-04 MED ORDER — ACETAMINOPHEN 500 MG PO TABS
1000.0000 mg | ORAL_TABLET | ORAL | Status: AC
  Administered 2022-11-04: 1000 mg via ORAL

## 2022-11-04 MED ORDER — PROPOFOL 10 MG/ML IV BOLUS
INTRAVENOUS | Status: DC | PRN
  Administered 2022-11-04: 150 mg via INTRAVENOUS

## 2022-11-04 MED ORDER — LIDOCAINE 2% (20 MG/ML) 5 ML SYRINGE
INTRAMUSCULAR | Status: AC
  Filled 2022-11-04: qty 5

## 2022-11-04 MED ORDER — OXYCODONE HCL 5 MG PO TABS: 5.0000 mg | ORAL_TABLET | Freq: Once | ORAL | Status: DC | PRN

## 2022-11-04 MED ORDER — OXYCODONE HCL 5 MG/5ML PO SOLN: 5.0000 mg | Freq: Once | ORAL | Status: DC | PRN

## 2022-11-04 MED ORDER — FENTANYL CITRATE (PF) 100 MCG/2ML IJ SOLN
INTRAMUSCULAR | Status: AC
  Filled 2022-11-04: qty 2

## 2022-11-04 MED ORDER — AMISULPRIDE (ANTIEMETIC) 5 MG/2ML IV SOLN: 10.0000 mg | Freq: Once | INTRAVENOUS | Status: DC | PRN

## 2022-11-04 MED ORDER — CEFAZOLIN SODIUM-DEXTROSE 2-4 GM/100ML-% IV SOLN: 2.0000 g | INTRAVENOUS | Status: DC

## 2022-11-04 MED ORDER — ACETAMINOPHEN 500 MG PO TABS
ORAL_TABLET | ORAL | Status: AC
  Filled 2022-11-04: qty 2

## 2022-11-04 MED ORDER — GABAPENTIN 100 MG PO CAPS
100.0000 mg | ORAL_CAPSULE | ORAL | Status: AC
  Administered 2022-11-04: 100 mg via ORAL

## 2022-11-04 MED ORDER — LACTATED RINGERS IV SOLN: INTRAVENOUS | Status: DC

## 2022-11-04 MED ORDER — FENTANYL CITRATE (PF) 100 MCG/2ML IJ SOLN
INTRAMUSCULAR | Status: DC | PRN
  Administered 2022-11-04: 50 ug via INTRAVENOUS

## 2022-11-04 MED ORDER — FENTANYL CITRATE (PF) 100 MCG/2ML IJ SOLN: 25.0000 ug | INTRAMUSCULAR | Status: DC | PRN

## 2022-11-04 MED ORDER — OXYCODONE HCL 5 MG PO TABS: 5.0000 mg | ORAL_TABLET | Freq: Four times a day (QID) | ORAL | 0 refills | Status: DC | PRN

## 2022-11-04 MED ORDER — ONDANSETRON HCL 4 MG/2ML IJ SOLN
INTRAMUSCULAR | Status: DC | PRN
  Administered 2022-11-04: 4 mg via INTRAVENOUS

## 2022-11-04 MED ORDER — MIDAZOLAM HCL 2 MG/2ML IJ SOLN
INTRAMUSCULAR | Status: AC
  Filled 2022-11-04: qty 2

## 2022-11-04 MED ORDER — PHENYLEPHRINE 80 MCG/ML (10ML) SYRINGE FOR IV PUSH (FOR BLOOD PRESSURE SUPPORT)
PREFILLED_SYRINGE | INTRAVENOUS | Status: AC
  Filled 2022-11-04: qty 10

## 2022-11-04 SURGICAL SUPPLY — 39 items
ADH SKN CLS APL DERMABOND .7 (GAUZE/BANDAGES/DRESSINGS) ×1
APL PRP STRL LF DISP 70% ISPRP (MISCELLANEOUS) ×1
APPLIER CLIP 9.375 MED OPEN (MISCELLANEOUS) ×1
APR CLP MED 9.3 20 MLT OPN (MISCELLANEOUS) ×1
BLADE SURG 15 STRL LF DISP TIS (BLADE) ×2 IMPLANT
BLADE SURG 15 STRL SS (BLADE) ×1
CANISTER SUC SOCK COL 7IN (MISCELLANEOUS) ×2 IMPLANT
CANISTER SUCT 1200ML W/VALVE (MISCELLANEOUS) ×2 IMPLANT
CHLORAPREP W/TINT 26 (MISCELLANEOUS) ×2 IMPLANT
CLIP APPLIE 9.375 MED OPEN (MISCELLANEOUS) IMPLANT
COVER BACK TABLE 60X90IN (DRAPES) ×2 IMPLANT
COVER MAYO STAND STRL (DRAPES) ×2 IMPLANT
COVER PROBE CYLINDRICAL 5X96 (MISCELLANEOUS) ×2 IMPLANT
DERMABOND ADVANCED .7 DNX12 (GAUZE/BANDAGES/DRESSINGS) ×2 IMPLANT
DRAPE LAPAROSCOPIC ABDOMINAL (DRAPES) ×2 IMPLANT
DRAPE UTILITY XL STRL (DRAPES) ×2 IMPLANT
ELECT COATED BLADE 2.86 ST (ELECTRODE) ×2 IMPLANT
ELECT REM PT RETURN 9FT ADLT (ELECTROSURGICAL) ×1
ELECTRODE REM PT RTRN 9FT ADLT (ELECTROSURGICAL) ×2 IMPLANT
GLOVE BIO SURGEON STRL SZ7.5 (GLOVE) ×4 IMPLANT
GOWN STRL REUS W/ TWL LRG LVL3 (GOWN DISPOSABLE) ×4 IMPLANT
GOWN STRL REUS W/TWL LRG LVL3 (GOWN DISPOSABLE) ×2
KIT MARKER MARGIN INK (KITS) ×2 IMPLANT
NDL HYPO 25X1 1.5 SAFETY (NEEDLE) IMPLANT
NEEDLE HYPO 25X1 1.5 SAFETY (NEEDLE) IMPLANT
NS IRRIG 1000ML POUR BTL (IV SOLUTION) IMPLANT
PACK BASIN DAY SURGERY FS (CUSTOM PROCEDURE TRAY) ×2 IMPLANT
PENCIL SMOKE EVACUATOR (MISCELLANEOUS) ×2 IMPLANT
SLEEVE SCD COMPRESS KNEE MED (STOCKING) ×2 IMPLANT
SPIKE FLUID TRANSFER (MISCELLANEOUS) IMPLANT
SPONGE T-LAP 18X18 ~~LOC~~+RFID (SPONGE) ×2 IMPLANT
SUT MON AB 4-0 PC3 18 (SUTURE) ×2 IMPLANT
SUT SILK 2 0 SH (SUTURE) IMPLANT
SUT VICRYL 3-0 CR8 SH (SUTURE) ×2 IMPLANT
SYR CONTROL 10ML LL (SYRINGE) IMPLANT
TOWEL GREEN STERILE FF (TOWEL DISPOSABLE) ×2 IMPLANT
TRAY FAXITRON CT DISP (TRAY / TRAY PROCEDURE) ×2 IMPLANT
TUBE CONNECTING 20X1/4 (TUBING) ×2 IMPLANT
YANKAUER SUCT BULB TIP NO VENT (SUCTIONS) IMPLANT

## 2022-11-04 NOTE — Anesthesia Preprocedure Evaluation (Addendum)
Anesthesia Evaluation  Patient identified by MRN, date of birth, ID band Patient awake    Reviewed: Allergy & Precautions, NPO status , Patient's Chart, lab work & pertinent test results  History of Anesthesia Complications Negative for: history of anesthetic complications  Airway Mallampati: II  TM Distance: >3 FB Neck ROM: Full    Dental no notable dental hx.    Pulmonary asthma , sleep apnea    Pulmonary exam normal        Cardiovascular + CAD, + Cardiac Stents (2019) and +CHF  Normal cardiovascular exam     Neuro/Psych  Headaches  Anxiety Depression       GI/Hepatic Neg liver ROS, hiatal hernia,GERD  Medicated,,  Endo/Other  negative endocrine ROS    Renal/GU negative Renal ROS     Musculoskeletal  (+) Arthritis ,    Abdominal   Peds  Hematology negative hematology ROS (+)   Anesthesia Other Findings Day of surgery medications reviewed with patient.  Reproductive/Obstetrics                             Anesthesia Physical Anesthesia Plan  ASA: 3  Anesthesia Plan: General   Post-op Pain Management: Tylenol PO (pre-op)*   Induction: Intravenous  PONV Risk Score and Plan: 3 and Treatment may vary due to age or medical condition, Ondansetron, Dexamethasone and Midazolam  Airway Management Planned: LMA  Additional Equipment: None  Intra-op Plan:   Post-operative Plan: Extubation in OR  Informed Consent: I have reviewed the patients History and Physical, chart, labs and discussed the procedure including the risks, benefits and alternatives for the proposed anesthesia with the patient or authorized representative who has indicated his/her understanding and acceptance.     Dental advisory given  Plan Discussed with: CRNA  Anesthesia Plan Comments:        Anesthesia Quick Evaluation

## 2022-11-04 NOTE — Op Note (Signed)
11/04/2022  10:16 AM  PATIENT:  Victoria [name]  67 y.o. female  PRE-OPERATIVE DIAGNOSIS:  LEFT BREAST CANCER  POST-OPERATIVE DIAGNOSIS:  LEFT BREAST CANCER  PROCEDURE:  Procedure(s): LEFT BREAST LUMPECTOMY WITH RADIOACTIVE SEED LOCALIZATION (Left)  SURGEON:  Surgeon(s) and Role:    * Griselda Miner, MD - Primary  PHYSICIAN ASSISTANT:   ASSISTANTS: none   ANESTHESIA:   local and general  EBL:  10 mL   BLOOD ADMINISTERED:none  DRAINS: none   LOCAL MEDICATIONS USED:  MARCAINE     SPECIMEN:  Source of Specimen:  left breast tissue with additional lateral and anterior margins  DISPOSITION OF SPECIMEN:  PATHOLOGY  COUNTS:  YES  TOURNIQUET:  * No tourniquets in log *  DICTATION: .Dragon Dictation  After informed consent was obtained the patient was brought to the operating room and placed in the supine position on the operating table.  After adequate induction of general anesthesia the patient's left breast was prepped with ChloraPrep, allowed to dry, and draped in usual sterile manner.  An appropriate timeout was performed.  Previously an I-125 seed was placed in the lateral aspect of the left breast to mark an area of invasive breast cancer.  The neoprobe was set to I-125 in the area of radioactivity was readily identified.  The area around this was infiltrated with quarter percent Marcaine.  I elected to make a vertically oriented curvilinear incision in the lateral right breast overlying the area of radioactivity.  The incision was carried through the skin and subcutaneous tissue sharply with the electrocautery.  Dissection was then carried towards the radioactive seed under the direction of the neoprobe.  Once I more closely approach the radioactive seed I then removed a circular portion of breast tissue sharply with the electrocautery around the radioactive seed while checking the area of radioactivity frequently.  Once the specimen was removed it was oriented with the  appropriate paint colors.  A specimen radiograph was obtained that showed the clip and seed to be within the specimen.  I did elect to take an additional lateral and anterior margin and these were also marked appropriately.  All of the tissue was then sent to pathology for further evaluation.  Hemostasis was achieved using the Bovie electrocautery.  The wound was irrigated with saline and infiltrated with more quarter percent Marcaine.  The cavity was marked with clips.  The deep layer of the incision was then closed with layers of interrupted 3-0 Vicryl stitches.  The skin was then closed with a running 4-0 Monocryl subcuticular stitch.  Dermabond dressings were applied.  The patient tolerated the procedure well.  At the end of the case all needle sponge and instrument counts were correct.  The patient was then awakened and taken to recovery in stable condition.  PLAN OF CARE: Discharge to home after PACU  PATIENT DISPOSITION:  PACU - hemodynamically stable.   Delay start of Pharmacological VTE agent (>24hrs) due to surgical blood loss or risk of bleeding: not applicable

## 2022-11-04 NOTE — Anesthesia Procedure Notes (Signed)
Procedure Name: LMA Insertion Date/Time: 11/04/2022 9:30 AM  Performed by: Ronnette Hila, CRNAPre-anesthesia Checklist: Patient identified, Emergency Drugs available, Suction available and Patient being monitored Patient Re-evaluated:Patient Re-evaluated prior to induction Oxygen Delivery Method: Circle System Utilized Preoxygenation: Pre-oxygenation with 100% oxygen Induction Type: IV induction Ventilation: Mask ventilation without difficulty LMA: LMA inserted LMA Size: 4.0 Number of attempts: 1 Airway Equipment and Method: bite block Placement Confirmation: positive ETCO2 Tube secured with: Tape Dental Injury: Teeth and Oropharynx as per pre-operative assessment

## 2022-11-04 NOTE — Interval H&P Note (Signed)
History and Physical Interval Note:  11/04/2022 9:04 AM  Victoria [name]  has presented today for surgery, with the diagnosis of LEFT BREAST CANCER.  The various methods of treatment have been discussed with the patient and family. After consideration of risks, benefits and other options for treatment, the patient has consented to  Procedure(s): LEFT BREAST LUMPECTOMY WITH RADIOACTIVE SEED LOCALIZATION (Left) as a surgical intervention.  The patient's history has been reviewed, patient examined, no change in status, stable for surgery.  I have reviewed the patient's chart and labs.  Questions were answered to the patient's satisfaction.     [name]

## 2022-11-04 NOTE — Anesthesia Postprocedure Evaluation (Signed)
Anesthesia Post Note  Patient: Victoria [name]  Procedure(s) Performed: LEFT BREAST LUMPECTOMY WITH RADIOACTIVE SEED LOCALIZATION (Left: Breast)     Patient location during evaluation: PACU Anesthesia Type: General Level of consciousness: awake and alert Pain management: pain level controlled Vital Signs Assessment: post-procedure vital signs reviewed and stable Respiratory status: spontaneous breathing, nonlabored ventilation and respiratory function stable Cardiovascular status: blood pressure returned to baseline Postop Assessment: no apparent nausea or vomiting Anesthetic complications: no   No notable events documented.  Last Vitals:  Vitals:   11/04/22 1100 11/04/22 1115  BP: 112/71 115/64  Pulse: 83 81  Resp: (!) 9 16  Temp:  36.6 C  SpO2: 98% 96%    Last Pain:  Vitals:   11/04/22 1115  TempSrc:   PainSc: 4                  Shanda Howells

## 2022-11-04 NOTE — Transfer of Care (Signed)
Immediate Anesthesia Transfer of Care Note  Patient: Victoria [name]  Procedure(s) Performed: LEFT BREAST LUMPECTOMY WITH RADIOACTIVE SEED LOCALIZATION (Left: Breast)  Patient Location: PACU  Anesthesia Type:General  Level of Consciousness: awake, alert , oriented, drowsy, and patient cooperative  Airway & Oxygen Therapy: Patient Spontanous Breathing and Patient connected to face mask oxygen  Post-op Assessment: Report given to RN and Post -op Vital signs reviewed and stable  Post vital signs: Reviewed and stable  Last Vitals:  Vitals Value Taken Time  BP 114/50 11/04/22 1024  Temp    Pulse 87 11/04/22 1025  Resp 11 11/04/22 1025  SpO2 98 % 11/04/22 1025  Vitals shown include unvalidated device data.  Last Pain:  Vitals:   11/04/22 0851  TempSrc: Oral  PainSc: 0-No pain         Complications: No notable events documented.

## 2022-11-04 NOTE — H&P (Signed)
REFERRING PHYSICIAN: Malachy Mood, MD PROVIDER: Lindell Noe, MD MRN: [id number] DOB: 1956-01-14 Subjective  Chief Complaint: Breast Cancer  History of Present Illness: Victoria [name] is a 67 y.o. female who is seen today as an office consultation for evaluation of Breast Cancer  We are asked to see the patient in consultation by Dr. Mosetta Putt to evaluate her for a new left breast cancer. The patient is a 67 year old white female who has a known PALB2 mutation. She is getting screening MRIs and her most recent MRI showed a 5 mm mass in the lower outer quadrant of the left breast. The lymph nodes look normal. There was a 7 mm area of enhancement anterior to this that was biopsied and benign. The mass was biopsied and came back as a grade 1 invasive ductal cancer that was ER and PR positive and HER2 negative with a Ki-67 of 5%. She does have some significant problems with asthma, anxiety, and depression. She does not smoke. She has a strong family history for breast cancer. She did have some recent chest pain but had a stress test and was cleared by her cardiologist. She also reports that she is having a hysterectomy on Tuesday of next week  Review of Systems: A complete review of systems was obtained from the patient. I have reviewed this information and discussed as appropriate with the patient. See HPI as well for other ROS.  ROS  Medical History: Past Medical History: Diagnosis Date Anxiety Arthritis Asthma, unspecified asthma severity, unspecified whether complicated, unspecified whether persistent (HHS-HCC) GERD (gastroesophageal reflux disease) History of cancer Hyperlipidemia  Patient Active Problem List Diagnosis Acute recurrent sinusitis Allergic bronchopulmonary aspergillosis (CMS/HHS-HCC) Anxiety and depression Asymmetric SNHL (sensorineural hearing loss) Cardiomegaly Chronic diastolic CHF (congestive heart failure), NYHA class 2 (CMS/HHS-HCC) Coronary artery disease due to  lipid rich plaque Cushing's syndrome (CMS/HHS-HCC) 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HHS-HCC) Family history of breast cancer Gastroesophageal reflux disease with hiatal hernia Greater trochanteric pain syndrome of left lower extremity Heart disease Hx of cold sores Hyperglycemia Hyperlipidemia, mixed Idiopathic scoliosis and kyphoscoliosis Insomnia Iron deficiency anemia Major depressive disorder, recurrent episode, severe (CMS/HHS-HCC) MRSA (methicillin resistant Staphylococcus aureus) colonization OA (osteoarthritis) of knee OSA (obstructive sleep apnea) Osteoporosis Primary malignant neoplasm of lower-outer quadrant of breast, left (CMS/HHS-HCC) Tinnitus of both ears Statin intolerance Statin myopathy  Past Surgical History: Procedure Laterality Date REPAIR VENTRAL HERNIA LAPAROSCOPIC 04/13/2012 Right/Left cath and coronary angiography 11/25/2014 Left heart cath and coronnary angiography 08/23/2017 COLONOSCOPY 03/2022   Allergies Allergen Reactions Lurasidone Hcl Other (See Comments) PER THE PT CAUSED RESTLESSNESS Beclomethasone Dipropionate Hives and Other (See Comments) weight gain Cyclobenzaprine Anxiety Mometasone-Formoterol Hives and Other (See Comments) weight gain Sulfa (Sulfonamide Antibiotics) Hives, Rash and Other (See Comments) Statins-Hmg-Coa Reductase Inhibitors Other (See Comments) Myalgias, RLS  Current Outpatient Medications on File Prior to Visit Medication Sig Dispense Refill acetaminophen (TYLENOL) 650 MG ER tablet Take 650 mg by mouth every 8 (eight) hours as needed albuterol MDI, PROVENTIL, VENTOLIN, PROAIR, HFA 90 mcg/actuation inhaler Inhale 1-2 inhalations into the lungs every 6 (six) hours as needed ascorbic acid, vitamin C, (VITAMIN C) 500 MG tablet Take 500 mg by mouth once daily aspirin 81 MG EC tablet Take 81 mg by mouth once daily benralizumab (FASENRA PEN) 30 mg/mL AtIn Inject 1 mL subcutaneously every 8 (eight) weeks budesonide (ENTOCORT EC) 3  mg EC capsule Take 3 mg by mouth once daily cholecalciferol (VITAMIN D3) 5,000 unit capsule Take 5,000 Units by mouth once daily clonazePAM (KLONOPIN) 0.5 MG tablet  Take 1 tablet by mouth 2 (two) times daily as needed doxepin (SINEQUAN) 25 MG capsule Take 25 mg by mouth at bedtime famotidine (PEPCID) 20 MG tablet Take 1 tablet by mouth 2 (two) times daily fexofenadine (ALLEGRA ALLERGY) 180 MG tablet Take 1 tablet by mouth once daily fluticasone propionate (FLONASE ALLERGY RELIEF) 50 mcg/actuation nasal spray Place 1 spray into both nostrils once daily ibuprofen (MOTRIN) 200 MG tablet Take 400 mg by mouth every 6 (six) hours as needed magnesium oxide 250 mg magnesium Tab Take 250 mg by mouth once daily mirtazapine (REMERON SOLTAB) 15 MG disintegrating tablet Take 7.5 mg by mouth at bedtime nitroGLYcerin (NITROSTAT) 0.4 MG SL tablet Place 0.4 mg under the tongue every 5 (five) minutes as needed OLANZapine (ZYPREXA) 5 MG tablet Take 5 mg by mouth at bedtime pindoloL (VISKEN) 5 MG tablet Take 5 mg by mouth Daily after breakfast REPATHA SURECLICK 140 mg/mL PnIj Inject 1 Pen subcutaneously every 14 (fourteen) days risperiDONE (RISPERDAL) 0.5 MG tablet Take 0.5 mg by mouth as directed TAKE 1 TABLET BY MOUTH 2 TIMES DAILY AT 8 AM AND 4 PM traZODone (DESYREL) 50 MG tablet Take 1-2 tablets by mouth at bedtime venlafaxine (EFFEXOR-XR) 75 MG XR capsule Take 3 capsules by mouth daily with breakfast  No current facility-administered medications on file prior to visit.  Family History Problem Relation Age of Onset Breast cancer Mother Diabetes Mother Hyperlipidemia (Elevated cholesterol) Mother High blood pressure (Hypertension) Mother Prostate cancer Father Breast cancer Sister Deep vein thrombosis (DVT or abnormal blood clot formation) Brother   Social History  Tobacco Use Smoking Status Never Smokeless Tobacco Never   Social History  Socioeconomic History Marital status: Married Tobacco  Use Smoking status: Never Smokeless tobacco: Never Vaping Use Vaping status: Never Used Substance and Sexual Activity Alcohol use: Yes Comment: 4-7 beers/week Drug use: Never  Social Determinants of Health  Financial Resource Strain: Low Risk (10/19/2021) Received from [facility] Overall Financial Resource Strain (CARDIA) Difficulty of Paying Living Expenses: Not hard at all Food Insecurity: No Food Insecurity (08/19/2022) Received from [facility] Hunger Vital Sign Worried About Running Out of Food in the Last Year: Never true Ran Out of Food in the Last Year: Never true Transportation Needs: No Transportation Needs (08/19/2022) [facility] from [facility] - Transportation Lack of Transportation (Medical): No Lack of Transportation (Non-Medical): No Physical Activity: Insufficiently Active (10/19/2021) Received from [facility] Exercise Vital Sign Days of Exercise per Week: 7 days Minutes of Exercise per Session: 20 min Stress: Stress Concern Present (10/19/2021) Received from [facility] - Occupational Stress Questionnaire Feeling of Stress : Rather much Social Connections: Moderately Integrated (10/19/2021) Received from [facility] Social Connection and Isolation Panel [NHANES] Frequency of Communication with Friends and Family: More than three times a week Frequency of Social Gatherings with Friends and Family: Once a week Attends Religious Services: More than 4 times per year Active Member of [employer] or Organizations: No Attends Banker Meetings: Never Marital Status: Married  Objective: There were no vitals filed for this visit. There is no height or weight on file to calculate BMI.  Physical Exam Vitals reviewed. Constitutional: General: She is not in acute distress. Appearance: Normal appearance. HENT: Head: Normocephalic and atraumatic. Right Ear: External ear normal. Left Ear: External ear  normal. Nose: Nose normal. Mouth/Throat: Mouth: Mucous membranes are moist. Pharynx: Oropharynx is clear. Eyes: General: No scleral icterus. Extraocular Movements: Extraocular movements intact. Conjunctiva/sclera: Conjunctivae normal. Pupils: Pupils  are equal, round, and reactive to light. Cardiovascular: Rate and Rhythm: Normal rate and regular rhythm. Pulses: Normal pulses. Heart sounds: Normal heart sounds. Pulmonary: Effort: Pulmonary effort is normal. No respiratory distress. Breath sounds: Normal breath sounds. Abdominal: General: Bowel sounds are normal. Palpations: Abdomen is soft. Tenderness: There is no abdominal tenderness. Musculoskeletal: General: No swelling, tenderness or deformity. Normal range of motion. Cervical back: Normal range of motion and neck supple. Skin: General: Skin is warm and dry. Coloration: Skin is not jaundiced. Neurological: General: No focal deficit present. Mental Status: She is alert and oriented to person, place, and time. Psychiatric: Mood and Affect: Mood normal. Behavior: Behavior normal.    Breast: There is a palpable bruise in the lateral aspect of the left breast. Other than this there is no palpable mass in either breast. There is no palpable axillary, supraclavicular, or cervical lymphadenopathy.  Labs, Imaging and Diagnostic Testing:  Assessment and Plan:  Diagnoses and all orders for this visit:  Malignant neoplasm of lower-outer quadrant of left breast of female, estrogen receptor positive (CMS/HHS-HCC)   The patient appears to have a 5 mm small favorable looking grade 1 cancer in the lower outer quadrant of the left breast. Because of the small favorable nature of the cancer she likely does not need a node evaluation. I have discussed with her in detail the different options for treatment and at this point she favors breast conservation which I feel is very reasonable. She understands that the gene mutation she has  makes her risk of going through this again higher but she is okay with that. I have discussed with her in detail the risks and benefits of the operation as well as some of the technical aspects including the use of a radioactive seed for localization and she understands and wishes to proceed. We will try to arrange this for next Tuesday when she has her hysterectomy if that is possible. She will also meet with medical and radiation oncology to discuss adjuvant therapy. We will continue with surgical scheduling

## 2022-11-04 NOTE — Discharge Instructions (Signed)
  Post Anesthesia Home Care Instructions  Activity: Get plenty of rest for the remainder of the day. A responsible individual must stay with you for 24 hours following the procedure.  For the next 24 hours, DO NOT: -Drive a car -Advertising copywriter -Drink alcoholic beverages -Take any medication unless instructed by your physician -Make any legal decisions or sign important papers.  Meals: Start with liquid foods such as gelatin or soup. Progress to regular foods as tolerated. Avoid greasy, spicy, heavy foods. If nausea and/or vomiting occur, drink only clear liquids until the nausea and/or vomiting subsides. Call your physician if vomiting continues.  Special Instructions/Symptoms: Your throat may feel dry or sore from the anesthesia or the breathing tube placed in your throat during surgery. If this causes discomfort, gargle with warm salt water. The discomfort should disappear within 24 hours.  If you had a scopolamine patch placed behind your ear for the management of post- operative nausea and/or vomiting:  1. The medication in the patch is effective for 72 hours, after which it should be removed.  Wrap patch in a tissue and discard in the trash. Wash hands thoroughly with soap and water. 2. You may remove the patch earlier than 72 hours if you experience unpleasant side effects which may include dry mouth, dizziness or visual disturbances. 3. Avoid touching the patch. Wash your hands with soap and water after contact with the patch.    *May have Tylenol today at 3pm 11/04/22

## 2022-11-07 ENCOUNTER — Encounter (HOSPITAL_BASED_OUTPATIENT_CLINIC_OR_DEPARTMENT_OTHER): Payer: Self-pay | Admitting: General Surgery

## 2022-11-07 LAB — SURGICAL PATHOLOGY

## 2022-11-08 ENCOUNTER — Ambulatory Visit: Payer: PPO | Admitting: Physician Assistant

## 2022-11-08 DIAGNOSIS — C50912 Malignant neoplasm of unspecified site of left female breast: Secondary | ICD-10-CM | POA: Diagnosis not present

## 2022-11-10 ENCOUNTER — Encounter: Payer: Self-pay | Admitting: *Deleted

## 2022-11-15 ENCOUNTER — Telehealth (HOSPITAL_BASED_OUTPATIENT_CLINIC_OR_DEPARTMENT_OTHER): Payer: PPO | Admitting: Psychiatry

## 2022-11-15 ENCOUNTER — Encounter (HOSPITAL_COMMUNITY): Payer: Self-pay | Admitting: Psychiatry

## 2022-11-15 DIAGNOSIS — G4733 Obstructive sleep apnea (adult) (pediatric): Secondary | ICD-10-CM

## 2022-11-15 DIAGNOSIS — F419 Anxiety disorder, unspecified: Secondary | ICD-10-CM | POA: Diagnosis not present

## 2022-11-15 DIAGNOSIS — F32A Depression, unspecified: Secondary | ICD-10-CM

## 2022-11-15 MED ORDER — DOXEPIN HCL 25 MG PO CAPS
25.0000 mg | ORAL_CAPSULE | Freq: Every day | ORAL | 3 refills | Status: DC
Start: 2022-11-15 — End: 2023-03-24

## 2022-11-15 MED ORDER — CLONAZEPAM 0.5 MG PO TABS: 0.5000 mg | ORAL_TABLET | Freq: Every day | ORAL | 0 refills | Status: DC

## 2022-11-15 MED ORDER — PINDOLOL 5 MG PO TABS
5.0000 mg | ORAL_TABLET | Freq: Every day | ORAL | 3 refills | Status: DC
Start: 2022-11-15 — End: 2023-01-20

## 2022-11-15 MED ORDER — VENLAFAXINE HCL ER 75 MG PO CP24
225.0000 mg | ORAL_CAPSULE | Freq: Every day | ORAL | 1 refills | Status: DC
Start: 2022-11-15 — End: 2023-01-20

## 2022-11-15 MED ORDER — RISPERIDONE 0.5 MG PO TABS
0.5000 mg | ORAL_TABLET | ORAL | 3 refills | Status: DC
Start: 2022-11-15 — End: 2023-01-20

## 2022-11-15 NOTE — Progress Notes (Signed)
BH MD/PA/NP OP Progress Note  11/15/2022 3:08 PM [name]  MRN:  [id number]  Visit Diagnosis:    ICD-10-CM   1. Anxiety and depression  F41.9 venlafaxine XR (EFFEXOR XR) 75 MG 24 hr capsule   F32.A risperiDONE (RISPERDAL) 0.5 MG tablet    pindolol (VISKEN) 5 MG tablet    doxepin (SINEQUAN) 25 MG capsule    clonazePAM (KLONOPIN) 0.5 MG tablet    2. OSA (obstructive sleep apnea)  G47.33       Assessment: Victoria [name] is a 67 y.o. female with a history of anxiety, depression, OSA who presented to [facility] at [facility] for initial evaluation on 06/30/2022.  At initial evaluation patient reported symptoms of anxiety including feeling nervous or on edge, being unable to stop controlled worrying, worrying too much about different things, and fear that something awful would happen.  Patient's symptoms have been gradually progressing.  There was a period in November 2023 where she was having passive SI without intent or plan.  Patient also did endorse low mood, poor sleep, difficulty concentrating.  She does have a history of obstructive sleep apnea and does not use CPAP with last sleep study being over 10 years ago. Psychosocially patient has increased stress of caring for her adult son with cerebral palsy and planning the next steps for him after she and her husband pass or are no longer able to care for him.  Patient met criteria for MDD and generalized anxiety disorder. Of note on exam patient was found to have smacking of her lips repetitive movements of her mouth which are concerning for parkinsonism secondary to Zyprexa.    [name] presents for follow-up evaluation. Today, 11/15/22, patient reports increased anxiety and depression over the last month.  She has been doing well until her surgery on 4/30 after which pindolol was discontinued due to hypotension.  Patient had a number of psychosocial stressors related to her son at that time in addition to recovering  from surgery which resulted in worsening depression and anxiety.  She restarted pindolol and denies any adverse side effects.  Patient was also able to restart therapy recently.  Symptom flareups seem to be secondary to the psychosocial stressors and will likely improve with added support.  Patients were encouraged to continue in therapy and provided with information for other group resources including [facility].  We will continue on her current regimen.  Patient denies any adverse side effects other than some weight gain secondary to Risperdal which has stabilized.  Plan: - Continue Effexor XR 225 mg QD - Continue Risperdal 0.5 mg BID - Continue Doxepin 25 mg QHS - Continue klonopin 0.5 mg QD prn for anxiety - Continue pindolol 5 mg QD - Discontinue Ativan - Recommend repeat sleep study, could consider dental devices OSA still present - CMP, CBC, lipid profile, Vit D, A1c, TSH reviewed - Continue with therapy every week through [place] therapy - Completed a 10 week wellness group through the [facility], followed bby group for special needs parents - Discussed other support groups - Crisis resources reviewed - Follow up in a month   Chief Complaint:  Chief Complaint  Patient presents with   Follow-up   HPI: [name] presents reporting things have been a bit more difficult the past month.  She had been going fairly well following her last appointment up until her surgery on 4/30.  Following the procedure her blood pressure dropped and she had discontinued the pindolol at  the recommendation of her provider.  During her recovery period Althia notes that she began to develop increasing anxiety in part related to some stressful things occurring with her son.  She notes that there was a change in speeding to which in turn led to a change in his medications, he got his first pressure sore, and he aspirated resulting in pneumonia.  This on top of her recovery, procedure followed by her second procedure earlier  this month was a lot for [name] to deal with.  She ended up restarting the pindolol and denies experiencing any lightheadedness or dizziness secondary to this.  While she has had some improvement she still notes that her depression and anxiety are not as well-controlled as they had been when she left the hospital or during her appointment last month.  She denies it being to the extent of thoughts of suicide or feelings of needing to be hospitalized.  Of note patient did resume therapy 3 weeks ago but only been able to go 1 or 2 times due to limited availability.  We discussed other options for groups and patient notes that she is going to go on a vacation with her husband in a month.  We reviewed her symptoms and provided support related to this.  Discussed options and decided to continue on her current regimen for this time.  Patient denies any notable side effects other than some increased appetite and weight gain likely secondary to the Risperdal.  She gained 10 pounds over the last several weeks following her surgery.  This has leveled off since with patient paying more attention to what she is eating later in the day.  Past Psychiatric History: One  prior psychiatric hospitalization to [facility] in February of 2024 due to increased depression/anxiety. No prior suicide attempts.  Patient did endorse passive SI in November of 2023 without any intent or plan.  She denies being connected with a psychiatrist in the past and had recently connected with a therapist at [facility] therapy.  Has taken Effexor (possible restlessness at 225), Cymbalta, Remeron, Ativan, Xanax, Klonopin, gabapentin, BuSpar, Zyprexa (TD), Latuda (akathisia), Risperdal, Doxepin,   Denies substance use other than 1 beer a day. Can increase to 2 when the weather is nice.  Past Medical History:  Past Medical History:  Diagnosis Date   Allergic bronchopulmonary aspergillosis (HCC) 2008   sees Dr Wenda Overland pulmonology   Anemia    iron  deficiency, resolved   Anxiety    Asthma    Breast cancer (HCC)    CAD (coronary artery disease)    a. LHC 6/16:  oOM1 60, pRCA 25 >> med Rx b. cath 3/19 2nd OM with 95% stenosis s/p synergy DES & anomalous RCA   CAP (community acquired pneumonia) 2016; 06/07/2016   Chronic bronchitis (HCC)    Chronic lower back pain    Complication of anesthesia    "think I have a hard time waking up from it"   Depression    mild   Diverticulitis    Diverticulosis    GERD (gastroesophageal reflux disease)    H/O hiatal hernia    Headache    "weekly" (08/23/2017)   History of echocardiogram    Echo 6/16:  Mod LVH, EF 60-65%, no RWMA, Gr 1 DD, trivial MR, normal LA size.   Hyperglycemia 11/20/2015   Hyperlipidemia, mixed 09/11/2007   Qualifier: Diagnosis of  By: Janit Bern   Did not tolerate Lipitor, zocor, Lovastatin, Pravastatin, Livalo, Crestor even low  dose    IBS (irritable bowel syndrome)    Maxillary sinusitis    Normal cardiac stress test 11/2011   No evidence of ischemia or infarct.   Calculated ejection fraction 72%.   Obesity    OSA (obstructive sleep apnea) 02/2012   has stopped using  cpap   Osteoarthritis    Osteoporosis    Pneumonia 11/2011   "before 2013 I hadn't had pneumonia since I was a child" (04/13/2012)   Pulmonary nodules    S/P angioplasty with stent 08/23/17 ostial 2nd OM with DES synnergy 08/24/2017   Schatzki's ring     Past Surgical History:  Procedure Laterality Date   ANTERIOR AND POSTERIOR REPAIR N/A 10/18/2022   Procedure: ANTERIOR (CYSTOCELE);  Surgeon: Marcelle Overlie, MD;  Location: [facility] OR;  Service: Gynecology;  Laterality: N/A;   APPENDECTOMY  1989   BREAST BIOPSY  11/03/2022   MM LT RADIOACTIVE SEED LOC MAMMO GUIDE 11/03/2022 [facility] MAMMOGRAPHY   BREAST LUMPECTOMY WITH RADIOACTIVE SEED LOCALIZATION Left 11/04/2022   Procedure: LEFT BREAST LUMPECTOMY WITH RADIOACTIVE SEED LOCALIZATION;  Surgeon: Griselda Miner, MD;  Location: [facility];   Service: General;  Laterality: Left;   CARDIAC CATHETERIZATION N/A 11/25/2014   Procedure: Right/Left Heart Cath and Coronary Angiography;  Surgeon: Lyn Records, MD;  Location: [facility] INVASIVE CV LAB;  Service: Cardiovascular;  Laterality: N/A;   CESAREAN SECTION  1985   COLONOSCOPY  03/2022   CORONARY ANGIOPLASTY WITH STENT PLACEMENT  08/23/2017   CORONARY STENT INTERVENTION N/A 08/23/2017   Procedure: CORONARY STENT INTERVENTION;  Surgeon: Kathleene Hazel, MD;  Location: [facility] INVASIVE CV LAB;  Service: Cardiovascular;  Laterality: N/A;   HERNIA REPAIR  04/13/2012   VHR laparoscopic   LAPAROSCOPIC BILATERAL SALPINGO OOPHERECTOMY Bilateral 10/18/2022   Procedure: LAPAROTOMY WITH BILATERAL SALPINGO OOPHORECTOMY;  Surgeon: Marcelle Overlie, MD;  Location: [facility] OR;  Service: Gynecology;  Laterality: Bilateral;   LEFT HEART CATH AND CORONARY ANGIOGRAPHY N/A 08/23/2017   Procedure: LEFT HEART CATH AND CORONARY ANGIOGRAPHY;  Surgeon: Kathleene Hazel, MD;  Location: [facility] INVASIVE CV LAB;  Service: Cardiovascular;  Laterality: N/A;   VAGINAL HYSTERECTOMY N/A 10/18/2022   Procedure: HYSTERECTOMY VAGINAL;  Surgeon: Marcelle Overlie, MD;  Location: [facility] OR;  Service: Gynecology;  Laterality: N/A;   VENTRAL HERNIA REPAIR  04/13/2012   Procedure: LAPAROSCOPIC VENTRAL HERNIA;  Surgeon: [name], MD;  Location: [facility] OR;  Service: General;  Laterality: N/A;  laparoscopic repair of incarcerated hernia    Family History:  Family History  Problem Relation Age of Onset   Breast cancer Mother 65       bilateral breast cancer dx. 57, met to liver   Hypertension Mother    Diabetes Mother    Diverticulosis Father    Prostate cancer Father    Breast cancer Sister 43       DCIS at 45, IDC at 52, PALB2+   Cancer Sister        breast cancer, invasive ductal carcinoma in 2022,DCIS at 69 with 4 weeks of radiation, 5 years of Tamoxifen    Pulmonary embolism Brother        recurrent   Heart attack  Maternal Grandfather    Ovarian cancer Paternal Grandmother    Cerebral palsy Son    Prostate cancer Paternal Uncle    Breast cancer Niece 2       PALB2+   Osteoporosis Niece    Stroke Neg Hx    Colon cancer Neg  Hx    Esophageal cancer Neg Hx    Stomach cancer Neg Hx    Rectal cancer Neg Hx     Social History:  Social History   Socioeconomic History   Marital status: Married    Spouse name: Not on file   Number of children: 1   Years of education: 14   Highest education level: Associate degree: academic program  Occupational History   Occupation: Disabled   Tobacco Use   Smoking status: Never    Passive exposure: Never   Smokeless tobacco: Never  Vaping Use   Vaping Use: Never used  Substance and Sexual Activity   Alcohol use: Yes    Alcohol/week: 4.0 - 7.0 standard drinks of alcohol    Types: 4 - 7 Standard drinks or equivalent per week   Drug use: No   Sexual activity: Yes    Comment: gluten free, lives with husband and son with CP quadriplegia  Other Topics Concern   Not on file  Social History Narrative   Cares for son with cerebral palsy.    Lives at home with her husband and son.   Right-handed.   2 cups caffeine per day.   One story home   Social Determinants of Health   Financial Resource Strain: Low Risk  (10/19/2021)   Overall Financial Resource Strain (CARDIA)    Difficulty of Paying Living Expenses: Not hard at all  Food Insecurity: No Food Insecurity (10/24/2022)   Hunger Vital Sign    Worried About Running Out of Food in the Last Year: Never true    Ran Out of Food in the Last Year: Never true  Transportation Needs: No Transportation Needs (10/24/2022)   PRAPARE - Administrator, Civil Service (Medical): No    Lack of Transportation (Non-Medical): No  Physical Activity: Insufficiently Active (10/19/2021)   Exercise Vital Sign    Days of Exercise per Week: 7 days    Minutes of Exercise per Session: 20 min  Stress: Stress Concern Present  (10/19/2021)   [employer] of Occupational Health - Occupational Stress Questionnaire    Feeling of Stress : Rather much  Social Connections: Moderately Integrated (10/19/2021)   Social Connection and Isolation Panel [NHANES]    Frequency of Communication with Friends and Family: More than three times a week    Frequency of Social Gatherings with Friends and Family: Once a week    Attends Religious Services: More than 4 times per year    Active Member of [employer] or Organizations: No    Attends Banker Meetings: Never    Marital Status: Married    Allergies:  Allergies  Allergen Reactions   Latuda [Lurasidone Hcl] Other (See Comments)    PER THE PT CAUSED RESTLESSNESS   Beclomethasone Dipropionate Hives and Other (See Comments)     weight gain   Flexeril [Cyclobenzaprine] Anxiety   Mometasone Furo-Formoterol Fum Hives and Other (See Comments)    weight gain   Sulfonamide Derivatives Hives and Rash   Statins Other (See Comments)    Myalgias, RLS    Current Medications: Current Outpatient Medications  Medication Sig Dispense Refill   acetaminophen (TYLENOL) 650 MG CR tablet Take 650 mg by mouth every 8 (eight) hours as needed for pain.     albuterol (VENTOLIN HFA) 108 (90 Base) MCG/ACT inhaler Inhale 1-2 puffs into the lungs every 6 (six) hours as needed. 8 g 2   ascorbic acid (VITAMIN C) 500 MG tablet Take  500 mg by mouth daily.     aspirin EC 81 MG tablet Take 81 mg by mouth daily. Swallow whole.     Benralizumab (FASENRA PEN) 30 MG/ML SOAJ Inject 1 mL (30 mg total) into the skin every 8 (eight) weeks. 1 mL 2   Calcium-Magnesium-Vitamin D (CALCIUM MAGNESIUM PO) Take 1 tablet by mouth at bedtime.     Cholecalciferol (VITAMIN D3 ULTRA STRENGTH) 125 MCG (5000 UT) capsule Take 5,000 Units by mouth daily.     [START ON 11/22/2022] clonazePAM (KLONOPIN) 0.5 MG tablet Take 1 tablet (0.5 mg total) by mouth daily. 30 tablet 0   denosumab (PROLIA) 60 MG/ML SOSY injection Inject  60 mg into the skin every 6 (six) months.     doxepin (SINEQUAN) 25 MG capsule Take 1 capsule (25 mg total) by mouth at bedtime. 30 capsule 3   Evolocumab (REPATHA SURECLICK) 140 MG/ML SOAJ Inject 140 mg into the skin every 21 ( twenty-one) days.     famotidine (PEPCID) 20 MG tablet TAKE 1 TABLET BY MOUTH TWICE A DAY 60 tablet 11   ibuprofen (ADVIL) 200 MG tablet Take 400 mg by mouth every 6 (six) hours as needed for moderate pain.     ipratropium-albuterol (DUONEB) 0.5-2.5 (3) MG/3ML SOLN Take 3 mLs by nebulization every 6 (six) hours as needed. 120 mL 5   nitroGLYCERIN (NITROSTAT) 0.4 MG SL tablet Place 1 tablet (0.4 mg total) under the tongue every 5 (five) minutes as needed for chest pain. 25 tablet 11   oxyCODONE (ROXICODONE) 5 MG immediate release tablet Take 1 tablet (5 mg total) by mouth every 6 (six) hours as needed for severe pain. 10 tablet 0   pindolol (VISKEN) 5 MG tablet Take 1 tablet (5 mg total) by mouth daily after breakfast. 30 tablet 3   risperiDONE (RISPERDAL) 0.5 MG tablet Take 1 tablet (0.5 mg total) by mouth 2 (two) times daily at 8 am and 4 pm. 60 tablet 3   venlafaxine XR (EFFEXOR XR) 75 MG 24 hr capsule Take 3 capsules (225 mg total) by mouth daily with breakfast. 90 capsule 1   No current facility-administered medications for this visit.     Psychiatric Specialty Exam: Review of Systems  There were no vitals taken for this visit.There is no height or weight on file to calculate BMI.  General Appearance: Well Groomed  Eye Contact:  Good  Speech:  Clear and Coherent and Normal Rate  Volume:  Normal  Mood:  Anxious and Depressed  Affect:  Congruent  Thought Process:  Coherent and Goal Directed  Orientation:  Full (Time, Place, and Person)  Thought Content: Logical   Suicidal Thoughts:  No  Homicidal Thoughts:  No  Memory:  NA  Judgement:  Good  Insight:  Fair  Psychomotor Activity:  Restlessness  Concentration:  Concentration: Good  Recall:  Fair  Fund of  Knowledge: Fair  Language: Good  Akathisia:  No    AIMS (if indicated): done  Assets:  Communication Skills Desire for Improvement Housing Intimacy Social Support Transportation Vocational/Educational  ADL's:  Intact  Cognition: WNL  Sleep:  Poor   Metabolic Disorder Labs: Lab Results  Component Value Date   HGBA1C 5.6 05/26/2022   MPG 117 10/22/2008   Lab Results  Component Value Date   PROLACTIN 15.4 08/12/2022   Lab Results  Component Value Date   CHOL 208 (H) 05/26/2022   TRIG 113.0 05/26/2022   HDL 74.00 05/26/2022   CHOLHDL 3 05/26/2022  VLDL 22.6 05/26/2022   LDLCALC 111 (H) 05/26/2022   LDLCALC 95 07/06/2021   Lab Results  Component Value Date   TSH 1.788 08/12/2022   TSH 1.43 05/26/2022    Therapeutic Level Labs: No results found for: "LITHIUM" No results found for: "VALPROATE" No results found for: "CBMZ"   Screenings: AIMS    Flowsheet Row Admission (Discharged) from 08/12/2022 in [facility] BE[facility]AL [facility]INE  AIMS Total Score 2      AUDIT    Flowsheet Row Admission (Discharged) from 08/12/2022 in [facility] BEHAVIORAL MEDICINE  Alcohol Use Disorder Identification Test Final Score (AUDIT) 0      GAD-7    Flowsheet Row Video Visit from 10/13/2022 in [facility] Office Visit from 06/30/2022 in [facility] Office Visit from 05/26/2022 in [facility] at [facility] Office Visit from 01/16/2017 in [facility] at [name]  Total GAD-7 Score 0 11 11 3       PHQ2-9    Flowsheet Row Video Visit from 10/13/2022 in [facility] at [facility] Telephone from 08/19/2022 in [facility] Office Visit from 06/30/2022 in [facility] Office Visit from 05/26/2022 in [facility] Care at [facility] Video Visit from 11/25/2021 in [facility] Care at [facility]  PHQ-2 Total Score 0 2 3 3  0  PHQ-9 Total Score 0 -- 4 4 4       Flowsheet Row Admission (Discharged) from 11/04/2022 in [facility] Admission (Discharged) from 10/18/2022 in [facility] Pre-Admission Testing 60 from 10/13/2022 in [facility] PREADMISSION TESTING  C-SSRS RISK CATEGORY No Risk No Risk No Risk       Collaboration of Care: Collaboration of Care: Medication Management AEB medication prescription and Other provider involved in patient's care AEB OBGYN and oncology chart review  Patient/Guardian was advised Release of Information must be obtained prior to any record release in order to collaborate their care with an outside provider. Patient/Guardian was advised if they have not already done so to contact the registration department to sign all necessary forms in order for [name] to release information regarding their care.   Consent: Patient/Guardian gives verbal consent for treatment and assignment of benefits for services provided during this visit. Patient/Guardian expressed understanding and agreed to proceed.    Stasia Cavalier, MD 11/15/2022, 3:08 PM   Virtual Visit via Video Note  I connected with [name] on 11/15/22 at  2:00 PM EDT by a video enabled telemedicine application and verified that I am speaking with the correct person using two identifiers.  Location: Patient: Home Provider: Home Office   I discussed the limitations of evaluation and management by telemedicine and the availability of in person appointments. The patient expressed understanding and agreed to proceed.   I discussed the assessment and treatment plan with the patient. The patient was provided an opportunity to ask questions and all were answered. The patient agreed with the plan and demonstrated an understanding of the instructions.   The patient was advised to call back or  seek an in-person evaluation if the symptoms worsen or if the condition fails to improve as anticipated.  I provided 20 minutes of non-face-to-face time during this encounter.   Stasia Cavalier, MD

## 2022-11-21 DIAGNOSIS — L821 Other seborrheic keratosis: Secondary | ICD-10-CM | POA: Diagnosis not present

## 2022-11-21 DIAGNOSIS — L578 Other skin changes due to chronic exposure to nonionizing radiation: Secondary | ICD-10-CM | POA: Diagnosis not present

## 2022-11-21 DIAGNOSIS — D1800 Hemangioma unspecified site: Secondary | ICD-10-CM | POA: Diagnosis not present

## 2022-11-21 DIAGNOSIS — D229 Melanocytic nevi, unspecified: Secondary | ICD-10-CM | POA: Diagnosis not present

## 2022-11-21 DIAGNOSIS — D0472 Carcinoma in situ of skin of left lower limb, including hip: Secondary | ICD-10-CM | POA: Diagnosis not present

## 2022-11-21 DIAGNOSIS — D485 Neoplasm of uncertain behavior of skin: Secondary | ICD-10-CM | POA: Diagnosis not present

## 2022-11-21 DIAGNOSIS — C44519 Basal cell carcinoma of skin of other part of trunk: Secondary | ICD-10-CM | POA: Diagnosis not present

## 2022-11-21 DIAGNOSIS — L814 Other melanin hyperpigmentation: Secondary | ICD-10-CM | POA: Diagnosis not present

## 2022-11-21 DIAGNOSIS — D239 Other benign neoplasm of skin, unspecified: Secondary | ICD-10-CM | POA: Diagnosis not present

## 2022-11-21 NOTE — Progress Notes (Signed)
Location of Breast Cancer:Primary malignant neoplasm of lower-outer quadrant of breast, left   Histology per Pathology Report:  11-04-22 FINAL MICROSCOPIC DIAGNOSIS:  A. BREAST, LEFT, LUMPECTOMY:      Invasive ductal carcinoma, 0.3 cm, grade 1 Ductal carcinoma in situ:  Not identified Margins, invasive:  Negative           Closest, invasive:  Posterior margin Margins, DCIS:  NA           Closest, DCIS:  NA Lymphovascular invasion:  Not identified Prognostic markers: ER: 100%, Positive, strong staining intensity PR: 100%, Positive, strong staining intensity Her2: Negative, 0 Ki-67: 5% Other:  Sclerosing adenosis, microcysts, dilated mammary ducts      Biopsy sites and clips identified See oncology table  B. BREAST, LEFT LATERAL MARGIN, EXCISION:      Benign breast tissue with sclerosing adenosis and microcysts.      Negative for malignancy.  C. BREAST, LEFT ANTERIOR MARGIN, EXCISION:      Benign breast tissue with sclerosing adenosis, microcysts, apocrine metaplasia, dilated mammary ducts.      Negative for malignancy.  ONCOLOGY TABLE:  INVASIVE CARCINOMA OF THE BREAST:  Resection  Procedure: Lumpectomy Specimen Laterality: Left Histologic Type: Invasive ductal carcinoma Histologic Grade:      Glandular (Acinar)/Tubular Differentiation: 1      Nuclear Pleomorphism: 2      Mitotic Rate: 1      Overall Grade: 1 Tumor Size: 0.3 cm Ductal Carcinoma In Situ: Not identified Lymphatic and/or Vascular Invasion:  Not identified Treatment Effect in the Breast: No known presurgical therapy Margins: All margins negative for invasive carcinoma      Distance from Closest Margin (mm): 5 mm      Specify Closest Margin (required only if <46mm): Posterior DCIS Margins: NA      Distance from Closest Margin (mm): NA      Specify Closest Margin (required only if <65mm): NA Regional Lymph Nodes: Not applicable (no lymph nodes submitted or found)      Number of Lymph Nodes Examined: 0       Number of Sentinel Nodes Examined: 0      Number of Lymph Nodes with Macrometastases (>2 mm): NA      Number of Lymph Nodes with Micrometastases: NA      Number of Lymph Nodes with Isolated Tumor Cells (=0.2 mm or =200 cells): NA      Size of Largest Metastatic Deposit (mm): NA      Extranodal Extension: NA Distant Metastasis:      Distant Site(s) Involved: Not applicable Breast Biomarker Testing Performed on Previous Biopsy:      Testing Performed on Case Number: [id number]            Estrogen Receptor: 100%, Positive, strong staining intensity            Progesterone Receptor: 100%, Positive, strong staining intensity            HER2: Negative, 0            Ki-67: 5% Pathologic Stage Classification (pTNM, AJCC 8th Edition): pT1a, pNx Representative Tumor Block: A4 Comment(s): None (v4.5.0.0)  Receptor Status: ER(100%), PR (100%), Her2-neu (ng), Ki-67(5%)  Did patient present with symptoms (if so, please note symptoms) or was this found on screening mammography?: She is getting screening MRIs and her most recent MRI showed a 5 mm mass in the lower outer quadrant of the left breast   Past/Anticipated interventions by surgeon, if any: 11-04-22 PROCEDURE:  Procedure(s): LEFT BREAST LUMPECTOMY WITH RADIOACTIVE SEED LOCALIZATION (Left)   SURGEON:  Surgeon(s) and Role:    Griselda Miner, MD - Primary  Past/Anticipated interventions by medical oncology, if any:  Dr. Mosetta Putt on 10-12-22 Oncology History Overview Note    Cancer Staging  Primary malignant neoplasm of lower-outer quadrant of breast, left Staging form: Breast, AJCC 8th Edition - Clinical stage from 10/10/2022: cT1a, cN0, cM0, G1 - Unsigned Stage prefix: Initial diagnosis Method of lymph node assessment: Clinical Histologic grading system: 3 grade system        Primary malignant neoplasm of lower-outer quadrant of breast, left   10/10/2022 Initial Diagnosis     Primary malignant neoplasm of lower-outer quadrant of  breast, left    ASSESSMENT & PLAN:  Victoria [name] is a 67 y.o. post-menopausal female with a history of    1.  Malignant neoplasm of lower outer quadrant of left breast, invasive ductal carcinoma, stage Ia, cT1aN0M0, ER+/PR+/HER2-, G1 --We discussed her imaging findings and the biopsy results in great details. -Giving the very early disease, she likely a candidate for lumpectomy. She is agreeable with that. She was seen by Dr. Carolynne Edouard today and likely will proceed with surgery soon.  Given her age and small primary tumor, it is reasonable to forego sentinel lymph node biopsy. -Given the small tumor, no Oncotype or adjuvant chemotherapy needed.  Given the favorable prognostic factors, this is likely low risk disease. -Giving the strong ER and PR expression in her postmenopausal status, I recommend adjuvant endocrine therapy with aromatase inhibitor or tamoxifen for a total of 5-10 years to reduce the risk of cancer recurrence. Potential benefits and side effects were discussed with patient and she is interested. -She had a history of hysterectomy, she is on Prolia injection for osteoporosis.  I think tamoxifen would be a better option for her -She was also seen by radiation oncologist Dr. Roselind Messier today. If her surgical sentinel lymph nodes were negative, she would not need post mastectomy radiation.  -We also discussed the breast cancer surveillance after her surgery. She will continue annual screening mammogram, self exam, and a routine office visit with lab and exam with [name]. -I encouraged her to have healthy diet and exercise regularly.     2.  Osteoporosis -She is on Prolia injection every 6 months, and vitamin D. -We discussed the impact of antiestrogen therapy on her bone density, she will repeat a bone density every 2 years    PLAN:  -I reviewed her imaging and biopsy results, she will proceed left breast lumpectomy with Dr. Cheral Bay in the near future -dIscuss antiestrogen therapy options, I  recommend tamoxifen, started after adjuvant radiation. -will see pt back after radiation  Lymphedema issues, if any:  {:18581} {t:21944}   Pain issues, if any:  {:18581} {PAIN DESCRIPTION:21022940}  SAFETY ISSUES: Prior radiation? {:18581} Pacemaker/ICD? {:18581} Possible current pregnancy?no Is the patient on methotrexate? no  Current Complaints / other details:  ***

## 2022-11-28 ENCOUNTER — Encounter: Payer: Self-pay | Admitting: Internal Medicine

## 2022-12-02 ENCOUNTER — Ambulatory Visit: Payer: PPO

## 2022-12-02 ENCOUNTER — Telehealth: Payer: Self-pay

## 2022-12-02 NOTE — Telephone Encounter (Signed)
Rn called in pt attempting to obtain meaningful use and nurse evaluation information. Unfortunately time would not permit for her to do so at time of phone call. Nursing staff to obtain this information on Monday.

## 2022-12-04 NOTE — Progress Notes (Signed)
Radiation Oncology         [phone] ________________________________  Name: Victoria [name] MRN: [id number]  Date: 12/05/2022  DOB: 1956-06-17  Re-Evaluation Note  CC: Bradd Canary, MD  Malachy Mood, MD  No diagnosis found.  Diagnosis:  Stage (cT1a, cN0, cM0) Left Breast LOQ, Invasive Ductal Carcinoma, ER+ / PR+ / Her2-, Grade 1: s/p lumpectomy   Narrative:  The patient returns today to discuss radiation treatment options. She was seen in the multidisciplinary breast clinic on 10/12/22.   She opted to proceed with a left breast lumpectomy without nodal biopsies on 11/04/22 under the care of Dr. Carolynne Edouard. Pathology from the procedure revealed: grade 1 invasive ductal carcinoma measuring 3 mm in the greatest extent; all margins negative for invasive carcinoma. Prognostic indicators significant for: estrogen receptor 100% positive and progesterone receptor 100% positive, both with strong staining intensity; Proliferation marker Ki67 at 5%; Her2 status negative; Grade 1.   Prior to undergoing breast conserving surgery, the patient also underwent a vaginal hysterectomy with BSO on 10/18/22. Pathology showed no evidence of malignancy overall.   She was seen by Dr. Mosetta Putt in consultation at the multidisciplinary breast clinic. Dr. Mosetta Putt recommends antiestrogen therapy consisting of tamoxifen. The patient will return to Dr. Mosetta Putt following XRT to initiate antiestrogen therapy.   On review of systems, the patient reports ***. She denies *** and any other symptoms.    Allergies:  is allergic to latuda [lurasidone hcl], beclomethasone dipropionate, flexeril [cyclobenzaprine], mometasone furo-formoterol fum, sulfonamide derivatives, and statins.  Meds: Current Outpatient Medications  Medication Sig Dispense Refill   acetaminophen (TYLENOL) 650 MG CR tablet Take 650 mg by mouth every 8 (eight) hours as needed for pain.     albuterol (VENTOLIN HFA) 108 (90 Base) MCG/ACT inhaler Inhale 1-2 puffs into  the lungs every 6 (six) hours as needed. 8 g 2   ascorbic acid (VITAMIN C) 500 MG tablet Take 500 mg by mouth daily.     aspirin EC 81 MG tablet Take 81 mg by mouth daily. Swallow whole.     Benralizumab (FASENRA PEN) 30 MG/ML SOAJ Inject 1 mL (30 mg total) into the skin every 8 (eight) weeks. 1 mL 2   Calcium-Magnesium-Vitamin D (CALCIUM MAGNESIUM PO) Take 1 tablet by mouth at bedtime.     Cholecalciferol (VITAMIN D3 ULTRA STRENGTH) 125 MCG (5000 UT) capsule Take 5,000 Units by mouth daily.     clonazePAM (KLONOPIN) 0.5 MG tablet Take 1 tablet (0.5 mg total) by mouth daily. 30 tablet 0   denosumab (PROLIA) 60 MG/ML SOSY injection Inject 60 mg into the skin every 6 (six) months.     doxepin (SINEQUAN) 25 MG capsule Take 1 capsule (25 mg total) by mouth at bedtime. 30 capsule 3   Evolocumab (REPATHA SURECLICK) 140 MG/ML SOAJ Inject 140 mg into the skin every 21 ( twenty-one) days.     famotidine (PEPCID) 20 MG tablet TAKE 1 TABLET BY MOUTH TWICE A DAY 60 tablet 11   ibuprofen (ADVIL) 200 MG tablet Take 400 mg by mouth every 6 (six) hours as needed for moderate pain.     ipratropium-albuterol (DUONEB) 0.5-2.5 (3) MG/3ML SOLN Take 3 mLs by nebulization every 6 (six) hours as needed. 120 mL 5   nitroGLYCERIN (NITROSTAT) 0.4 MG SL tablet Place 1 tablet (0.4 mg total) under the tongue every 5 (five) minutes as needed for chest pain. 25 tablet 11   oxyCODONE (ROXICODONE) 5 MG immediate release tablet Take 1 tablet (5  mg total) by mouth every 6 (six) hours as needed for severe pain. 10 tablet 0   pindolol (VISKEN) 5 MG tablet Take 1 tablet (5 mg total) by mouth daily after breakfast. 30 tablet 3   risperiDONE (RISPERDAL) 0.5 MG tablet Take 1 tablet (0.5 mg total) by mouth 2 (two) times daily at 8 am and 4 pm. 60 tablet 3   venlafaxine XR (EFFEXOR XR) 75 MG 24 hr capsule Take 3 capsules (225 mg total) by mouth daily with breakfast. 90 capsule 1   No current facility-administered medications for this  encounter.    Physical Findings: The patient is in no acute distress. Patient is alert and oriented.  vitals were not taken for this visit.  No significant changes. Lungs are clear to auscultation bilaterally. Heart has regular rate and rhythm. No palpable cervical, supraclavicular, or axillary adenopathy. Abdomen soft, non-tender, normal bowel sounds. Right Breast: no palpable mass, nipple discharge or bleeding. Left Breast: ***  Lab Findings: Lab Results  Component Value Date   WBC 7.5 10/19/2022   HGB 10.6 (L) 10/19/2022   HCT 31.1 (L) 10/19/2022   MCV 88.6 10/19/2022   PLT 223 10/19/2022    Radiographic Findings: No results found.  Impression:  Stage (cT1a, cN0, cM0) Left Breast LOQ, Invasive Ductal Carcinoma, ER+ / PR+ / Her2-, Grade 1: s/p lumpectomy   ***  Plan:  Patient is scheduled for CT simulation {date/later today}. ***  -----------------------------------  Billie Lade, PhD, MD  This document serves as a record of services personally performed by Antony Blackbird, MD. It was created on his behalf by Neena Rhymes, a trained medical scribe. The creation of this record is based on the scribe's personal observations and the provider's statements to them. This document has been checked and approved by the attending provider.

## 2022-12-05 ENCOUNTER — Encounter: Payer: Self-pay | Admitting: Radiation Oncology

## 2022-12-05 ENCOUNTER — Ambulatory Visit
Admission: RE | Admit: 2022-12-05 | Discharge: 2022-12-05 | Disposition: A | Discharge status: Home / Self Care | Payer: PPO | Source: Ambulatory Visit | Attending: Radiation Oncology | Admitting: Radiation Oncology

## 2022-12-05 ENCOUNTER — Other Ambulatory Visit: Payer: Self-pay

## 2022-12-05 VITALS — BP 100/58 | HR 80 | Temp 98.0°F | Resp 18 | Ht 61.0 in | Wt 163.0 lb

## 2022-12-05 DIAGNOSIS — Z170 Estrogen receptor positive status [ER+]: Secondary | ICD-10-CM | POA: Insufficient documentation

## 2022-12-05 DIAGNOSIS — C50512 Malignant neoplasm of lower-outer quadrant of left female breast: Secondary | ICD-10-CM

## 2022-12-05 DIAGNOSIS — Z7982 Long term (current) use of aspirin: Secondary | ICD-10-CM | POA: Insufficient documentation

## 2022-12-05 DIAGNOSIS — Z7952 Long term (current) use of systemic steroids: Secondary | ICD-10-CM | POA: Insufficient documentation

## 2022-12-05 DIAGNOSIS — Z510 Encounter for antineoplastic radiation therapy: Secondary | ICD-10-CM | POA: Diagnosis not present

## 2022-12-05 DIAGNOSIS — Z79899 Other long term (current) drug therapy: Secondary | ICD-10-CM | POA: Diagnosis not present

## 2022-12-05 NOTE — Progress Notes (Unsigned)
Victoria [name] is a 67 y.o. female presents to the office today for Prolia injection, per physician's orders. Original order: 10/2020 Prolia 60 mg was administered left SQ arm today. Patient tolerated injection. Patient due for follow up labs/provider appt: No.  Patient next injection due: 6 month, appt made No- will schedule in 5 months

## 2022-12-06 ENCOUNTER — Ambulatory Visit (INDEPENDENT_AMBULATORY_CARE_PROVIDER_SITE_OTHER): Payer: PPO | Admitting: *Deleted

## 2022-12-06 ENCOUNTER — Telehealth: Payer: Self-pay | Admitting: *Deleted

## 2022-12-06 DIAGNOSIS — M810 Age-related osteoporosis without current pathological fracture: Secondary | ICD-10-CM

## 2022-12-06 DIAGNOSIS — C50912 Malignant neoplasm of unspecified site of left female breast: Secondary | ICD-10-CM | POA: Diagnosis not present

## 2022-12-06 MED ORDER — DENOSUMAB 60 MG/ML ~~LOC~~ SOSY
60.0000 mg | PREFILLED_SYRINGE | Freq: Once | SUBCUTANEOUS | Status: AC
Start: 2022-12-06 — End: 2022-12-06
  Administered 2022-12-06: 60 mg via SUBCUTANEOUS

## 2022-12-06 NOTE — Telephone Encounter (Signed)
Prolia given today. 

## 2022-12-07 DIAGNOSIS — D0472 Carcinoma in situ of skin of left lower limb, including hip: Secondary | ICD-10-CM | POA: Diagnosis not present

## 2022-12-07 DIAGNOSIS — C44519 Basal cell carcinoma of skin of other part of trunk: Secondary | ICD-10-CM | POA: Diagnosis not present

## 2022-12-08 ENCOUNTER — Telehealth: Payer: Self-pay

## 2022-12-08 ENCOUNTER — Telehealth: Payer: Self-pay | Admitting: Hematology

## 2022-12-08 NOTE — Telephone Encounter (Signed)
Called patient twice, left a message regarding appointment time/dates.

## 2022-12-08 NOTE — Telephone Encounter (Signed)
Per Dr. Roselind Messier  delay patients radiation treatment start date until week of 12/27/22 to allow skin to heal. Patient to come in prior to treatment for Dr. Roselind Messier to assess skin. Called and made patient aware. Patient voiced understanding.

## 2022-12-08 NOTE — Telephone Encounter (Signed)
Patient called in to reports having a basal cell carcinoma to left clavicle removed by Dr. Girtha Rm on 12/07/22. Patient states she has 9 stiches to area. Patient is due to start radiation treatment to left  breast on 12/19/22. Patient also has follow up appointment with Dr. Girtha Rm  on 12/19/22 to have stiches removed. Patient is concerned that incision will interfere with  radiation treatment field. Patient asking if left clavicle could be covered during treatment. Pls advise

## 2022-12-13 DIAGNOSIS — Z170 Estrogen receptor positive status [ER+]: Secondary | ICD-10-CM | POA: Diagnosis not present

## 2022-12-13 DIAGNOSIS — C50912 Malignant neoplasm of unspecified site of left female breast: Secondary | ICD-10-CM | POA: Diagnosis not present

## 2022-12-13 DIAGNOSIS — C50512 Malignant neoplasm of lower-outer quadrant of left female breast: Secondary | ICD-10-CM | POA: Diagnosis not present

## 2022-12-13 DIAGNOSIS — Z510 Encounter for antineoplastic radiation therapy: Secondary | ICD-10-CM | POA: Diagnosis not present

## 2022-12-16 ENCOUNTER — Ambulatory Visit: Payer: PPO | Admitting: Internal Medicine

## 2022-12-19 ENCOUNTER — Ambulatory Visit: Payer: PPO | Admitting: Radiation Oncology

## 2022-12-20 ENCOUNTER — Telehealth (HOSPITAL_BASED_OUTPATIENT_CLINIC_OR_DEPARTMENT_OTHER): Payer: PPO | Admitting: Psychiatry

## 2022-12-20 ENCOUNTER — Encounter: Payer: Self-pay | Admitting: Radiation Oncology

## 2022-12-20 ENCOUNTER — Encounter (HOSPITAL_COMMUNITY): Payer: Self-pay | Admitting: Psychiatry

## 2022-12-20 ENCOUNTER — Ambulatory Visit: Payer: PPO

## 2022-12-20 DIAGNOSIS — F419 Anxiety disorder, unspecified: Secondary | ICD-10-CM | POA: Diagnosis not present

## 2022-12-20 DIAGNOSIS — F32A Depression, unspecified: Secondary | ICD-10-CM

## 2022-12-20 MED ORDER — CLONAZEPAM 0.5 MG PO TABS
0.5000 mg | ORAL_TABLET | Freq: Every day | ORAL | 0 refills | Status: DC
Start: 2023-01-02 — End: 2023-01-20

## 2022-12-20 NOTE — Progress Notes (Signed)
BH MD/PA/NP OP Progress Note  12/20/2022 3:27 PM FRANCILLE BURCHELL  MRN:  [id number]  Visit Diagnosis:    ICD-10-CM   1. Anxiety and depression  F41.9 clonazePAM (KLONOPIN) 0.5 MG tablet   F32.A       Assessment: Victoria [name] is a 67 y.o. female with a history of anxiety, depression, OSA who presented to [facility] at [facility] for initial evaluation on 06/30/2022.  At initial evaluation patient reported symptoms of anxiety including feeling nervous or on edge, being unable to stop controlled worrying, worrying too much about different things, and fear that something awful would happen.  Patient's symptoms have been gradually progressing.  There was a period in November 2023 where she was having passive SI without intent or plan.  Patient also did endorse low mood, poor sleep, difficulty concentrating.  She does have a history of obstructive sleep apnea and does not use CPAP with last sleep study being over 10 years ago. Psychosocially patient has increased stress of caring for her adult son with cerebral palsy and planning the next steps for him after she and her husband pass or are no longer able to care for him.  Patient met criteria for MDD and generalized anxiety disorder. Of note on exam patient was found to have smacking of her lips repetitive movements of her mouth which are concerning for parkinsonism secondary to Zyprexa.    [name] presents for follow-up evaluation. Today, 12/20/22, patient reports her anxiety and depression have improved this past month.  Currently she endorses mild anxiety symptoms in the morning which resolve after she takes her medication.  Of note patient did decrease the evening dose of Risperdal to 0.5 mg every other day on her own.  We discussed this and the risk of making medication changes without coordinating with her provider.  Suggested changing the evening dose to 0.25 mg each night.  Also recommended not making further medication  changes at this time and continuing on the remainder of her current regimen.  Patient was also encouraged to continue with therapy.  Follow up in a month.  Plan: - Continue Effexor XR 225 mg QD - Taper Risperdal 0.5 mg every day and 0.25 mg QHS - Continue Doxepin 25 mg QHS - Continue klonopin 0.5 mg QD prn for anxiety - Continue pindolol 5 mg QD - Recommend repeat sleep study, could consider dental devices OSA still present - CMP, CBC, lipid profile, Vit D, A1c, TSH reviewed - Continue with therapy every week through [place] therapy - Completed a 10 week wellness group through the [place], followed by a group for special needs parents - Discussed other support groups - Crisis resources reviewed - Follow up in a month   Chief Complaint:  Chief Complaint  Patient presents with   Follow-up   HPI: [name] presents reporting that she is pretty good.  Her anxiety has been pretty good past month.  She feels like things are slowly improving.  Her sleep has been stable too.  At this point the main time she experiences anxiety is when she first wakes up in the morning and describes it as a restless or jittery feeling that improves after she takes her medication at 9 AM.  She also notes that she can have a fear of something bubbling under the surface or the other foot waiting to drop.  She was encouraged to discuss these feelings with her therapist.  Patient is scheduled to start radiation next Monday for  a 4-week course though denies being overly anxious about the procedure.  [name] reports that the main negative that she is dealing with is the more rapid weight gain since starting on Risperdal.  She had gained 10 pounds since starting which had taken her 2 years to lose initially.  She does believe is related to the medication as her appetite has been increased since starting it.  [name] notes that she has scoliosis and is concerned that the increased weight can negatively impact this.  She had decreased the  bedtime Risperdal dose to every other day on her own.  We reviewed the risk of making medication adjustments on her own in addition to the risk of relapsing depression/anxiety shortly after hospitalization.  Suggested changing the Risperdal dose to 0.25 at bedtime if able.  Discussed that there is a possibility of tapering off Risperdal in the future however it would be beneficial to wait a minimum of 6 months following her hospitalization in addition to waiting until after she finishes her radiation treatments.  Past Psychiatric History: One  prior psychiatric hospitalization to [facility] in February of 2024 due to increased depression/anxiety. No prior suicide attempts.  Patient did endorse passive SI in November of 2023 without any intent or plan.  She denies being connected with a psychiatrist in the past and had recently connected with a therapist at [facility] therapy.  Has taken Effexor (possible restlessness at 225), Cymbalta, Remeron, Ativan, Xanax, Klonopin, gabapentin, BuSpar, Zyprexa (TD), Latuda (akathisia), Risperdal, Doxepin,   Denies substance use other than 1 beer a day. Can increase to 2 when the weather is nice.  Past Medical History:  Past Medical History:  Diagnosis Date   Allergic bronchopulmonary aspergillosis (HCC) 2008   sees Dr Wenda Overland pulmonology   Anemia    iron deficiency, resolved   Anxiety    Asthma    Breast cancer (HCC)    CAD (coronary artery disease)    a. LHC 6/16:  oOM1 60, pRCA 25 >> med Rx b. cath 3/19 2nd OM with 95% stenosis s/p synergy DES & anomalous RCA   CAP (community acquired pneumonia) 2016; 06/07/2016   Chronic bronchitis (HCC)    Chronic lower back pain    Complication of anesthesia    "think I have a hard time waking up from it"   Depression    mild   Diverticulitis    Diverticulosis    GERD (gastroesophageal reflux disease)    H/O hiatal hernia    Headache    "weekly" (08/23/2017)   History of echocardiogram    Echo 6/16:  Mod LVH,  EF 60-65%, no RWMA, Gr 1 DD, trivial MR, normal LA size.   Hyperglycemia 11/20/2015   Hyperlipidemia, mixed 09/11/2007   Qualifier: Diagnosis of  By: Janit Bern   Did not tolerate Lipitor, zocor, Lovastatin, Pravastatin, Livalo, Crestor even low dose    IBS (irritable bowel syndrome)    Maxillary sinusitis    Normal cardiac stress test 11/2011   No evidence of ischemia or infarct.   Calculated ejection fraction 72%.   Obesity    OSA (obstructive sleep apnea) 02/2012   has stopped using  cpap   Osteoarthritis    Osteoporosis    Pneumonia 11/2011   "before 2013 I hadn't had pneumonia since I was a child" (04/13/2012)   Pulmonary nodules    S/P angioplasty with stent 08/23/17 ostial 2nd OM with DES synnergy 08/24/2017   Schatzki's ring     Past Surgical  History:  Procedure Laterality Date   ANTERIOR AND POSTERIOR REPAIR N/A 10/18/2022   Procedure: ANTERIOR (CYSTOCELE);  Surgeon: Marcelle Overlie, MD;  Location: [facility] OR;  Service: Gynecology;  Laterality: N/A;   APPENDECTOMY  1989   BREAST BIOPSY  11/03/2022   MM LT RADIOACTIVE SEED LOC MAMMO GUIDE 11/03/2022 [facility] MAMMOGRAPHY   BREAST LUMPECTOMY WITH RADIOACTIVE SEED LOCALIZATION Left 11/04/2022   Procedure: LEFT BREAST LUMPECTOMY WITH RADIOACTIVE SEED LOCALIZATION;  Surgeon: Griselda Miner, MD;  Location: [facility];  Service: General;  Laterality: Left;   CARDIAC CATHETERIZATION N/A 11/25/2014   Procedure: Right/Left Heart Cath and Coronary Angiography;  Surgeon: Lyn Records, MD;  Location: [facility] INVASIVE CV LAB;  Service: Cardiovascular;  Laterality: N/A;   CESAREAN SECTION  1985   COLONOSCOPY  03/2022   CORONARY ANGIOPLASTY WITH STENT PLACEMENT  08/23/2017   CORONARY STENT INTERVENTION N/A 08/23/2017   Procedure: CORONARY STENT INTERVENTION;  Surgeon: Kathleene Hazel, MD;  Location: [facility] INVASIVE CV LAB;  Service: Cardiovascular;  Laterality: N/A;   HERNIA REPAIR  04/13/2012   VHR laparoscopic   LAPAROSCOPIC  BILATERAL SALPINGO OOPHERECTOMY Bilateral 10/18/2022   Procedure: LAPAROTOMY WITH BILATERAL SALPINGO OOPHORECTOMY;  Surgeon: Marcelle Overlie, MD;  Location: [facility] OR;  Service: Gynecology;  Laterality: Bilateral;   LEFT HEART CATH AND CORONARY ANGIOGRAPHY N/A 08/23/2017   Procedure: LEFT HEART CATH AND CORONARY ANGIOGRAPHY;  Surgeon: Kathleene Hazel, MD;  Location: [facility] INVASIVE CV LAB;  Service: Cardiovascular;  Laterality: N/A;   VAGINAL HYSTERECTOMY N/A 10/18/2022   Procedure: HYSTERECTOMY VAGINAL;  Surgeon: Marcelle Overlie, MD;  Location: [facility] OR;  Service: Gynecology;  Laterality: N/A;   VENTRAL HERNIA REPAIR  04/13/2012   Procedure: LAPAROSCOPIC VENTRAL HERNIA;  Surgeon: [name], MD;  Location: [facility] OR;  Service: General;  Laterality: N/A;  laparoscopic repair of incarcerated hernia    Family History:  Family History  Problem Relation Age of Onset   Breast cancer Mother 53       bilateral breast cancer dx. 81, met to liver   Hypertension Mother    Diabetes Mother    Diverticulosis Father    Prostate cancer Father    Breast cancer Sister 41       DCIS at 75, IDC at 58, PALB2+   Cancer Sister        breast cancer, invasive ductal carcinoma in 2022,DCIS at 44 with 4 weeks of radiation, 5 years of Tamoxifen    Pulmonary embolism Brother        recurrent   Heart attack Maternal Grandfather    Ovarian cancer Paternal Grandmother    Cerebral palsy Son    Prostate cancer Paternal Uncle    Breast cancer Niece 38       PALB2+   Osteoporosis Niece    Stroke Neg Hx    Colon cancer Neg Hx    Esophageal cancer Neg Hx    Stomach cancer Neg Hx    Rectal cancer Neg Hx     Social History:  Social History   Socioeconomic History   Marital status: Married    Spouse name: Not on file   Number of children: 1   Years of education: 14   Highest education level: Associate degree: academic program  Occupational History   Occupation: Disabled   Tobacco Use   Smoking status:  Never    Passive exposure: Never   Smokeless tobacco: Never  Vaping Use   Vaping Use: Never used  Substance and  Sexual Activity   Alcohol use: Yes    Alcohol/week: 4.0 - 7.0 standard drinks of alcohol    Types: 4 - 7 Standard drinks or equivalent per week   Drug use: No   Sexual activity: Yes    Birth control/protection: Surgical    Comment: gluten free, lives with husband and son with CP quadriplegia  Other Topics Concern   Not on file  Social History Narrative   Cares for son with cerebral palsy.    Lives at home with her husband and son.   Right-handed.   2 cups caffeine per day.   One story home   Social Determinants of Health   Financial Resource Strain: Low Risk  (10/19/2021)   Overall Financial Resource Strain (CARDIA)    Difficulty of Paying Living Expenses: Not hard at all  Food Insecurity: No Food Insecurity (10/24/2022)   Hunger Vital Sign    Worried About Running Out of Food in the Last Year: Never true    Ran Out of Food in the Last Year: Never true  Transportation Needs: No Transportation Needs (10/24/2022)   PRAPARE - Administrator, Civil Service (Medical): No    Lack of Transportation (Non-Medical): No  Physical Activity: Insufficiently Active (10/19/2021)   Exercise Vital Sign    Days of Exercise per Week: 7 days    Minutes of Exercise per Session: 20 min  Stress: Stress Concern Present (10/19/2021)   [employer] of Occupational Health - Occupational Stress Questionnaire    Feeling of Stress : Rather much  Social Connections: Moderately Integrated (10/19/2021)   Social Connection and Isolation Panel [NHANES]    Frequency of Communication with Friends and Family: More than three times a week    Frequency of Social Gatherings with Friends and Family: Once a week    Attends Religious Services: More than 4 times per year    Active Member of [employer] or Organizations: No    Attends Banker Meetings: Never    Marital Status: Married     Allergies:  Allergies  Allergen Reactions   Latuda [Lurasidone Hcl] Other (See Comments)    PER THE PT CAUSED RESTLESSNESS   Beclomethasone Dipropionate Hives and Other (See Comments)     weight gain   Flexeril [Cyclobenzaprine] Anxiety   Mometasone Furo-Formoterol Fum Hives and Other (See Comments)    weight gain   Sulfonamide Derivatives Hives and Rash   Statins Other (See Comments)    Myalgias, RLS    Current Medications: Current Outpatient Medications  Medication Sig Dispense Refill   acetaminophen (TYLENOL) 650 MG CR tablet Take 650 mg by mouth every 8 (eight) hours as needed for pain.     albuterol (VENTOLIN HFA) 108 (90 Base) MCG/ACT inhaler Inhale 1-2 puffs into the lungs every 6 (six) hours as needed. 8 g 2   ascorbic acid (VITAMIN C) 500 MG tablet Take 500 mg by mouth daily.     aspirin EC 81 MG tablet Take 81 mg by mouth daily. Swallow whole.     Benralizumab (FASENRA PEN) 30 MG/ML SOAJ Inject 1 mL (30 mg total) into the skin every 8 (eight) weeks. 1 mL 2   budesonide (ENTOCORT EC) 3 MG 24 hr capsule Take 3 mg by mouth daily.     Calcium-Magnesium-Vitamin D (CALCIUM MAGNESIUM PO) Take 1 tablet by mouth at bedtime.     Cholecalciferol (VITAMIN D3 ULTRA STRENGTH) 125 MCG (5000 UT) capsule Take 5,000 Units by mouth daily.     [  START ON 01/02/2023] clonazePAM (KLONOPIN) 0.5 MG tablet Take 1 tablet (0.5 mg total) by mouth daily. 30 tablet 0   denosumab (PROLIA) 60 MG/ML SOSY injection Inject 60 mg into the skin every 6 (six) months.     doxepin (SINEQUAN) 25 MG capsule Take 1 capsule (25 mg total) by mouth at bedtime. 30 capsule 3   Evolocumab (REPATHA SURECLICK) 140 MG/ML SOAJ Inject 140 mg into the skin every 21 ( twenty-one) days.     famotidine (PEPCID) 20 MG tablet TAKE 1 TABLET BY MOUTH TWICE A DAY 60 tablet 11   ibuprofen (ADVIL) 200 MG tablet Take 400 mg by mouth every 6 (six) hours as needed for moderate pain.     ipratropium-albuterol (DUONEB) 0.5-2.5 (3) MG/3ML  SOLN Take 3 mLs by nebulization every 6 (six) hours as needed. 120 mL 5   nitroGLYCERIN (NITROSTAT) 0.4 MG SL tablet Place 1 tablet (0.4 mg total) under the tongue every 5 (five) minutes as needed for chest pain. 25 tablet 11   oxyCODONE (ROXICODONE) 5 MG immediate release tablet Take 1 tablet (5 mg total) by mouth every 6 (six) hours as needed for severe pain. 10 tablet 0   pindolol (VISKEN) 5 MG tablet Take 1 tablet (5 mg total) by mouth daily after breakfast. 30 tablet 3   risperiDONE (RISPERDAL) 0.5 MG tablet Take 1 tablet (0.5 mg total) by mouth 2 (two) times daily at 8 am and 4 pm. 60 tablet 3   venlafaxine XR (EFFEXOR XR) 75 MG 24 hr capsule Take 3 capsules (225 mg total) by mouth daily with breakfast. 90 capsule 1   No current facility-administered medications for this visit.     Psychiatric Specialty Exam: Review of Systems  There were no vitals taken for this visit.There is no height or weight on file to calculate BMI.  General Appearance: Well Groomed  Eye Contact:  Good  Speech:  Clear and Coherent and Normal Rate  Volume:  Normal  Mood:  Euthymic and mild anxiety  Affect:  Congruent  Thought Process:  Coherent and Goal Directed  Orientation:  Full (Time, Place, and Person)  Thought Content: Logical and somatic concerns    Suicidal Thoughts:  No  Homicidal Thoughts:  No  Memory:  NA  Judgement:  Good  Insight:  Fair  Psychomotor Activity:  Restlessness and primarily in the morning  Concentration:  Concentration: Good  Recall:  Fair  Fund of Knowledge: Fair  Language: Good  Akathisia:  No    AIMS (if indicated): done  Assets:  Communication Skills Desire for Improvement Housing Intimacy Social Support Transportation Vocational/Educational  ADL's:  Intact  Cognition: WNL  Sleep:  Poor   Metabolic Disorder Labs: Lab Results  Component Value Date   HGBA1C 5.6 05/26/2022   MPG 117 10/22/2008   Lab Results  Component Value Date   PROLACTIN 15.4 08/12/2022    Lab Results  Component Value Date   CHOL 208 (H) 05/26/2022   TRIG 113.0 05/26/2022   HDL 74.00 05/26/2022   CHOLHDL 3 05/26/2022   VLDL 22.6 05/26/2022   LDLCALC 111 (H) 05/26/2022   LDLCALC 95 07/06/2021   Lab Results  Component Value Date   TSH 1.788 08/12/2022   TSH 1.43 05/26/2022    Therapeutic Level Labs: No results found for: "LITHIUM" No results found for: "VALPROATE" No results found for: "CBMZ"   Screenings: AIMS    Flowsheet Row Admission (Discharged) from 08/12/2022 in [facility]IC[facility]  AIMS Total Score  2      AUDIT    Flowsheet Row Admission (Discharged) from 08/12/2022 in [facility] BE[facility]IORAL MED[facility]  Alcohol Use Disorder Identification Test Final Score (AUDIT) 0      GAD-7    Flowsheet Row Video Visit from 10/13/2022 in [facility] at [facility] Office Visit from 06/30/2022 in [facility] Office Visit from 05/26/2022 in [facility] at [facility] Office Visit from 01/16/2017 in [facility] at [facility]  Total GAD-7 Score 0 11 11 3       PHQ2-9    Flowsheet Row Video Visit from 10/13/2022 in [facility] at [facility] Telephone from 08/19/2022 in [facility] Office Visit from 06/30/2022 in [facility] Office Visit from 05/26/2022 in [facility] Care at [facility] Video Visit from 11/25/2021 in [facility] Primary Care at [facility]  PHQ-2 Total Score 0 2 3 3  0  PHQ-9 Total Score 0 -- 4 4 4       Flowsheet Row Admission (Discharged) from 11/04/2022 in [facility] Admission (Discharged) from 10/18/2022 in [facility] Pre-Admission Testing 60 from 10/13/2022 in [facility] PREADMISSION TESTING  C-SSRS RISK CATEGORY No Risk No Risk No Risk        Collaboration of Care: Collaboration of Care: Medication Management AEB medication prescription, Primary Care Provider AEB chart review, and Other provider involved in patient's care AEB oncology chart review  Patient/Guardian was advised Release of Information must be obtained prior to any record release in order to collaborate their care with an outside provider. Patient/Guardian was advised if they have not already done so to contact the registration department to sign all necessary forms in order for [name] to release information regarding their care.   Consent: Patient/Guardian gives verbal consent for treatment and assignment of benefits for services provided during this visit. Patient/Guardian expressed understanding and agreed to proceed.    Stasia Cavalier, MD 12/20/2022, 3:27 PM   Virtual Visit via Video Note  I connected with [name] on 12/20/22 at  3:00 PM EDT by a video enabled telemedicine application and verified that I am speaking with the correct person using two identifiers.  Location: Patient: Home Provider: Home Office   I discussed the limitations of evaluation and management by telemedicine and the availability of in person appointments. The patient expressed understanding and agreed to proceed.   I discussed the assessment and treatment plan with the patient. The patient was provided an opportunity to ask questions and all were answered. The patient agreed with the plan and demonstrated an understanding of the instructions.   The patient was advised to call back or seek an in-person evaluation if the symptoms worsen or if the condition fails to improve as anticipated.  I provided 20 minutes of non-face-to-face time during this encounter.   Stasia Cavalier, MD

## 2022-12-21 ENCOUNTER — Ambulatory Visit: Payer: PPO

## 2022-12-23 ENCOUNTER — Encounter: Payer: Self-pay | Admitting: Internal Medicine

## 2022-12-23 ENCOUNTER — Ambulatory Visit: Payer: PPO

## 2022-12-23 ENCOUNTER — Ambulatory Visit: Payer: PPO | Admitting: Internal Medicine

## 2022-12-23 VITALS — BP 102/64 | HR 77 | Temp 98.3°F | Ht 61.0 in | Wt 162.8 lb

## 2022-12-23 DIAGNOSIS — J45909 Unspecified asthma, uncomplicated: Secondary | ICD-10-CM

## 2022-12-23 DIAGNOSIS — J209 Acute bronchitis, unspecified: Secondary | ICD-10-CM

## 2022-12-23 MED ORDER — DOXYCYCLINE HYCLATE 100 MG PO TABS: 100.0000 mg | ORAL_TABLET | Freq: Two times a day (BID) | ORAL | 0 refills | Status: DC

## 2022-12-23 MED ORDER — ASMANEX HFA 100 MCG/ACT IN AERO: 2.0000 | INHALATION_SPRAY | Freq: Two times a day (BID) | RESPIRATORY_TRACT | 4 refills | Status: DC

## 2022-12-23 MED ORDER — PREDNISONE 10 MG PO TABS: ORAL_TABLET | ORAL | 0 refills | Status: AC

## 2022-12-23 NOTE — Progress Notes (Signed)
OV 10/06/2016  Chief Complaint  Patient presents with   Follow-up    Pt states her asthma is good; had one flare up since last visit. SOB with exertion, cough with yellow to brown mucus prodcution, with frequent wheezing. Notice improvement with being on [employer]      67 year old female with severe asthma with ABPA and elevated IgE and eosinophilia. She is on IL5RAb antibody treatment now for the last few months. She feels this is helping her significantly. Her symptoms course of better. She is on Asmanex, Spiriva, Singulair as well. She is not on daily prednisone anymore. In December 2017 should she did grow MRSA from sputum culture. Currently a few days ago she developed sore throat and having some sputum production. She is worried this might be MRSA. Also asthma itself is stable and well controlled according to history. She is wondering about getting a sputum culture and really wishes for it    OV 01/11/2017  Chief Complaint  Patient presents with   Follow-up    Pt states her breathing is doing well. Pt states the last couple days she has had more wheezing. Pt states she has noticed an improvement being on Cinqair.    67 year old female with severe asthma with ABPA and elevated IgE and eosinophilia. She is on IL5RAb antibody treatment.  She is on Asmanex, Spiriva, Singulair as well. She is not on daily prednisone anymore   01/11/2017 - Here for routine follow-up. She is on interleukin-5 receptor antibody IV infusion therapy. This was held a few weeks ago because of asthma exacerbation. She says approximately on Father's Day 2018 he started feeling unwell and then ended up with an exacerbation for which she needed antibiotics and prednisone. She is surprised by this decompensation despite being on this biologic immunotherapy for asthma. She is compliant with her Asmanex, Spiriva and Singulair. She is not on prednisone. Currently she feels baseline. Patient infusion #6 or 7 is due for  tomorrow [interleukin-5 receptor antibody). Overall she feels improved quality of life. Despite all this exhaled nitric oxide continues to be HIGH; unclear why   OV 04/21/2017  Chief Complaint  Patient presents with   Follow-up    Pt states that she has been doing good. Has had no flare-ups with her asthma. Mild coughing at night. Denies any SOB or CP.  severe asthma with ABPA and elevated IgE and eosinophilia. She is on IL5RAb antibody treatment.  She is on Asmanex, Spiriva, Singulair as well. She is not on daily prednisone anymore  Continues cinqair spiriva, singulair, and asmanex. At this point in time the interleukin-5 receptor antibody is working really well for her. For nitric oxide today is lowest ever.. In terms of his symptoms she hardly ever gets up at night. When she wakes up she does not have any symptoms. She only has slight limitation with physical activity. She's not shortness of breath because of asthma not wheezing and not using albuterol for rescue use. The asthma control score is 0.8. However she is extremely frustrated by the [facility] gravida cycle management of her interleukin-5 receptor antibody infusions. She is getting different bills wrong bills missed bills. Therefore she wants to switch to subcutaneous injectable therapy. She is very upset about the quality of care that she has received at the infusion center because of billing practices.  OV 10/19/2017  Chief Complaint  Patient presents with   Follow-up    PFT done today. Pt states she has been  doing well. Has had a little more coughing and wheezing recently and thinks it could be due to the weather. Pt just got started back on Fasenra injections.   Severe persistent asthma with ABPA elevated IgE and eosinophilia. S/P cinqair interleukin-5 receptor antibody treatment ending" 2018 with good response but stopped secondary to billing errors at [facility] infusion center. Based on medical need then switched  to anti-eosinophil interleukin-5 receptor antibody subcutaneous injection treatment Fasenra early 2019. She is also on baseline Asmanex, Spiriva, Singulair and antihistamine 8. She is not on daily prednisone anymore.  Last seen November 2018. Overall she was doing well with good effort tolerance but in March 2019 had some atypical chest painand ended up with a coronary artery stent in the abuse marginal. She is improved from that. She is wondering about taking a beta blocker safely. She does not feel that his anemia because of blood pressure is low but apparently cardiology is asked her to clear this with me. In the interim her IV injection interleukin-5 receptor antibody has been changed to subcutaneous injection FASENRA . Because of her cardiac issues she's only taken 3 doses since January 2019. She hopes to go back on schedule with this. She had billing issues at the [facility] infusion center with the IV infusion and that is why we switched to the subcutaneous injection. However, it looks like the workflow authorization for this was not done correctly and she isn't of the large co-pay. She is unhappy about this and routinely discussed this with the patient. In terms of asthma she feels her cough is decompensated in the last few weeks and this correlates with her not taking her Asmanex. Her lung function test is near normal today. Her exhaled nitric oxide is slightly elevated than the lowest that she's had but still better than her baseline average. Currently she is waking up at nightcoughing because of the spring pollen but only hardly. Very mild symptoms when she wakes up she is slightly limited in the daytime and she experiences shortness of breath little of the time not using albuterol for rescue.    Results for PAGAN, LOVEALL (MRN [id number]) as of 10/19/2017 11:51  Ref. Range 10/19/2017 10:47  FEV1-Post Latest Units: L 1.80  FEV1-%Pred-Post Latest Units: % 76  FEV1-%Change-Post Latest Units: % 0   Results for TARIAH, LAMBERTY (MRN [id number]) as of 10/19/2017 11:51  Ref. Range 10/19/2017 10:47  TLC Latest Units: L 4.90  TLC % pred Latest Units: % 103  Results for LECHELLE, DALAL (MRN [id number]) as of 10/19/2017 11:51  Ref. Range 10/19/2017 10:47  DLCO unc Latest Units: ml/min/mmHg 19.51  DLCO unc % pred Latest Units: % 90    OV 05/14/2018  Subjective:  Patient ID: Victoria [name], female , DOB: 08-18-1955 , age 6 y.o. , MRN: [id number] , ADDRESS: [address] [place] [address]   05/14/2018 -   Chief Complaint  Patient presents with   Follow-up    breathing good, SOB on exertion   Severe persistent asthma with ABPA elevated IgE and eosinophilia. S/P cinqair interleukin-5 receptor antibody treatment ending" 2018 with good response but stopped secondary to billing errors at [facility] infusion center. Based on medical need then switched to anti-eosinophil interleukin-5 receptor antibody subcutaneous injection treatment Fasenra early 2019. She is also on baseline Asmanex, Spiriva, Singulair and antihistamine 8. She is not on daily prednisone anymore.  Known hiatal hernia  HPI ARIELLY ZARLENGO 67 y.o. -returns for follow-up of  severe persistent asthma.  Earlier this year we switched her to interleukin-5 receptor antibody subcutaneous injection may be [employer]stra[employer] called Fasenra.  With this she feels her asthma symptoms have improved dramatically.  She feels it is better than seeing care.  She still thankful to the scientist of developed this biologic.  Her exam nitric oxide is the lowest ever at 27.  She is not having any nighttime symptoms or any wheezing.  In fact she stopped her inhaled steroid partly because she was feeling well but partly also because she is in the donut hole.  However she is continuing her Spiriva and Singulair.  She is up-to-date with her flu shot.    She is dealing with skin infections.  Primary care physician notes reviewed.  There is question whether this is  being precipitated by the fact she is on to Biologics.  She thinks she had MRSA but when I reviewed the chart on May 08, 2018 shows MSSA.  Therefore she is been started on Augmentin today which she is yet to start.  She also has hiatal hernia.  She has not seen GI in a few years.  It is fairly well-controlled and she plans to see GI soon.  OV 02/14/2019  Subjective:  Patient ID: Victoria [name], female , DOB: 1955-12-08 , age 29 y.o. , MRN: [id number] , ADDRESS: [address] [place] [address]   02/14/2019 -   Chief Complaint  Patient presents with   Severe persistent asthma with intensive monitoring   Severe persistent asthma with ABPA elevated IgE and eosinophilia. S/P cinqair interleukin-5 receptor antibody treatment ending" 2018 with good response but stopped secondary to billing errors at [facility] infusion center. Based on medical need then switched to anti-eosinophil interleukin-5 receptor antibody subcutaneous injection treatment Fasenra early 2019. She is  NOT on baseline Asmanex, Spiriva, Singulair and antihistamine 8. She is not on daily prednisone anymore.  Known hiatal hernia  HPI LAKELYNN RASOR 67 y.o. -is here for follow-up of asthma with ABPA.  Since her last visit she is only on biologic Fasenra.  She has stopped Singulair, Spiriva, Asmanex.  She is not taking daily prednisone.  Asthma is well controlled.  A few weeks ago she did have a itchy feeling intake epinephrine.  She also had wheeze at this time.  This helped control the situation.  Other than that she is doing well asthma is extremely well controlled.  No nocturnal awakenings no albuterol rescue use.  We are unable to do exhaled nitric oxide testing today because of the pandemic.  She told me that she wants to remove budesonide out of her allergy list because she is only allergic to [facility] and not the nebulizer.  She is doing social distancing.  She had an appointment earlier in the year with GI because of her  hiatal hernia but because of the pandemic she did not follow through.  She is open to having this appointment and referral made again.    OV 05/04/2020  Subjective:  Patient ID: Victoria [name], female , DOB: 26-Nov-1955 , age 50 y.o. , MRN: [id number] , ADDRESS: [address] [place] [address] PCP Bradd Canary, MD Patient Care Team: Bradd Canary, MD as PCP - General (Family Medicine) Lars Masson, MD as PCP - Cardiology (Cardiology) Haverstock, Elvin So, MD as Referring Physician (Dermatology) Kalman Shan, MD as Consulting Physician (Pulmonary Disease) Lars Masson, MD as Consulting Physician (Cardiology) Marcelle Overlie, MD as Consulting Physician (  Obstetrics and Gynecology) Suzanna Obey, MD as Consulting Physician (Otolaryngology) Pyrtle, Carie Caddy, MD as Consulting Physician (Gastroenterology) Drema Dallas, DO as Consulting Physician (Neurology)  This Provider for this visit: Treatment Team:  Attending Provider: Kalman Shan, MD    05/04/2020 -   Chief Complaint  Patient presents with   Follow-up    Pt c/o cough since 1 day ago- prod with yellow sputum. She has also been wheezing some. She uses her duoneb 4-6 x per wk.      HPI PIERINA LEPISTO 68 y.o. -presents for follow-up of a complicated asthma with ABPA.  She has been overall doing well with Harrington Challenger.  However in September 2021 after being exposed to someone with Covid she had respiratory exacerbation but she was ruled out for Covid.  She took 5 days of prednisone and did well but she had a cough that lingered through October 2021.  Then she developed chest pains.  We did a CT angiogram that ruled out PE.  I was involved in the decision making.  It only showed hiatal hernia.  I personally visualized that CT again.  She can got worried that the Harrington Challenger is not working because of that one respiratory exacerbation and the lingering cough.  In addition she tells me that yesterday while eating  some crackers she choked and today she is wheezing.  She is noted to be on alendronate for the last 2 years.  This is by primary care physician.  In the spring 2020 when she did see GI Dr. Rhea Belton and interventional endoscopy has been recommended but she is so far holding off on that.  Her acid reflux regimen has been escalated.  She is up-to-date with her vaccine but is inquiring about Covid booster timing.   Asthma Control Test ACT Total Score  05/04/2020 19     Lab Results  Component Value Date   NITRICOXIDE 42 04/21/2017     OV 06/04/2021  Subjective:  Patient ID: Victoria [name], female , DOB: 04/02/1956 , age 89 y.o. , MRN: [id number] , ADDRESS: [address] [place] [id number] PCP Bradd Canary, MD Patient Care Team: Bradd Canary, MD as PCP - General (Family Medicine) Lars Masson, MD (Inactive) as PCP - Cardiology (Cardiology) Haverstock, Elvin So, MD as Referring Physician (Dermatology) Kalman Shan, MD as Consulting Physician (Pulmonary Disease) Lars Masson, MD (Inactive) as Consulting Physician (Cardiology) Marcelle Overlie, MD as Consulting Physician (Obstetrics and Gynecology) Suzanna Obey, MD as Consulting Physician (Otolaryngology) Pyrtle, Carie Caddy, MD as Consulting Physician (Gastroenterology) Drema Dallas, DO as Consulting Physician (Neurology)  This Provider for this visit: Treatment Team:  Attending Provider: Kalman Shan, MD    06/04/2021 -   Chief Complaint  Patient presents with   Follow-up    Pt states she has been doing okay since last visit. States she has had sinus pressure and also some pain in sinuses.    Severe persistent asthma with ABPA elevated IgE and eosinophilia. S/P cinqair interleukin-5 receptor antibody treatment ending" 2018 with good response but stopped secondary to billing errors at [facility] infusion center. Based on medical need then switched to anti-eosinophil interleukin-5 receptor antibody  subcutaneous injection treatment Fasenra early 2019. She is  NOT on baseline Asmanex, Spiriva, Singulair and antihistamine 8. She is not on daily prednisone anymore.  Known hiatal hernia  HPI GIONNA ECKE 67 y.o. -returns for follow-up.  Is a routine follow-up.  At this point in time she is  on biologic Fasenra.  She is not taking any of her inhalers or Singulair.  She did see Tammy.  In the summer 2022 had an exacerbation had to go on prednisone.  Personally saw her a year ago.  Currently asthma is well controlled.  She says she has intermittent wheezing and is wondering if she should be on a maintenance inhaler.  She asked me about evidence in literature about monotherapy with Harrington Challenger.  Explained to her that Biologics was studied as add-on therapy and she needs to be on a maintenance therapy.  She is agreeable to go back on her Asmanex.  This because she does have some occasional wheezing. ACT score shows good control  Of note she is having chronic headaches.  She has been on [place] since 2019.  Prior to that she was started on Repatha.  She is also on Prolia These can cause headaches.    She had CT angio which shows stable hiatale hernia.   Asthma Control Test ACT Total Score  06/04/2021 23  03/17/2021 16  05/04/2020 19    Lab Results  Component Value Date   NITRICOXIDE 42 04/21/2017     CT Chest data  No results found.      OV 09/06/2021  Subjective:  Patient ID: Victoria [name], female , DOB: [birth year] , age 10 y.o. , MRN: [id number] , ADDRESS: [address] [place] [address] PCP Bradd Canary, MD Patient Care Team: Bradd Canary, MD as PCP - General (Family Medicine) Lars Masson, MD as PCP - Cardiology (Cardiology) Haverstock, Elvin So, MD as Referring Physician (Dermatology) Kalman Shan, MD as Consulting Physician (Pulmonary Disease) Lars Masson, MD as Consulting Physician (Cardiology) Marcelle Overlie, MD as Consulting  Physician (Obstetrics and Gynecology) Suzanna Obey, MD as Consulting Physician (Otolaryngology) Pyrtle, Carie Caddy, MD as Consulting Physician (Gastroenterology) Drema Dallas, DO as Consulting Physician (Neurology)  This Provider for this visit: Treatment Team:  Attending Provider: Kalman Shan, MD    09/06/2021 -   Chief Complaint  Patient presents with   Follow-up    Pt denies any flare ups with her asthma since last visit. States that she does use her rescue inhaler when she needs it. States that she is still having pain in her sternum and ribs and in back which she has had since last visit.     Severe persistent asthma with ABPA elevated IgE and eosinophilia. S/P cinqair interleukin-5 receptor antibody treatment ending" 2018 with good response but stopped secondary to billing errors at [facility] infusion center. Based on medical need then switched to anti-eosinophil interleukin-5 receptor antibody subcutaneous injection treatment Fasenra early 2019. She is  NOT on baseline Asmanex, Spiriva, Singulair and antihistamine 8. She is not on daily prednisone anymore.  Known hiatal hernia   HPI STACIA BRICKETT 67 y.o. -returns for her 65-month follow-up.  Asthma itself is well controlled.  However she is dealing with significant spinal issues.  She follows with Dr. Cherene Altes at a clinic in [place].  She tells me that her scoliosis is worse and the kyphosis is worse.  She is lost 1.25 inches in height and the kyphosis is worse by 13 degrees.  Associated with that she is having persistent left rib cage pain and also sternal pain.  She states at 1 point on 08/15/2021 the pain was so bad she went to the ER and called EMS.  She has been treated with meloxicam.  She also saw Dr. Noreene Filbert and's sports  medicine is being given left hip injection.  She is attended massage therapy on 08/27/2021 and periodically is used walker.  Currently somewhat better.  She has had CT scans.  She also had ANA that was positive on  09/03/2021.  The results available between 1: 80 and 1: 320.  Apparently rheumatology referral is in place.  Asthma and hiatal hernia itself under control.  She is quite bothered by the pain.  This is despite physical therapy.  She is frustrated by this.    (H): Data is abnormally highh  OV 03/07/2022  Subjective:  Patient ID: Victoria [name], female , DOB: Aug 17, 1955 , age 66 y.o. , MRN: [id number] , ADDRESS: [address] [place] [phone] PCP Bradd Canary, MD Patient Care Team: Bradd Canary, MD as PCP - General (Family Medicine) Lars Masson, MD as PCP - Cardiology (Cardiology) Haverstock, Elvin So, MD as Referring Physician (Dermatology) Kalman Shan, MD as Consulting Physician (Pulmonary Disease) Lars Masson, MD as Consulting Physician (Cardiology) Marcelle Overlie, MD as Consulting Physician (Obstetrics and Gynecology) Suzanna Obey, MD as Consulting Physician (Otolaryngology) Pyrtle, Carie Caddy, MD as Consulting Physician (Gastroenterology) Drema Dallas, DO as Consulting Physician (Neurology)  This Provider for this visit: Treatment Team:  Attending Provider: Kalman Shan, MD    03/07/2022 -   Chief Complaint  Patient presents with   Follow-up    Pt states she had covid about 3 weeks ago and states she still has a cough that she cannot get rid of.      HPI BROCK BARTEN 67 y.o. -returns for follow-up.  She continues on [place].  Last visit I reintroduced inhaled corticosteroid Asmanex but for some reason she is not on it.  We went over the rationale for Biologics which is failed inhaler therapy.  Did express concern that if she is not on inhaler therapy that her Harrington Challenger could be denied in the future.  She is going to restart Asmanex.  Currently she does not have any acid reflux symptoms.  Her back pain is better after she got new orthotics that increases her shoe height.  The only issue is that 3 weeks ago she had COVID-19.  She did take  antiviral and then she got better.  She did have some residual cough but primary care physician has given her some Hydromet.  She says the cough is working its way out.  There are no other new issues.  She will have a flu shot today.   OV 09/15/2022  Subjective:  Patient ID: Victoria [name], female , DOB: Aug 14, 1955 , age 5 y.o. , MRN: [id number] , ADDRESS: [address] [place] [phone] PCP Bradd Canary, MD Patient Care Team: Bradd Canary, MD as PCP - General (Family Medicine) Lars Masson, MD as PCP - Cardiology (Cardiology) Haverstock, Elvin So, MD as Referring Physician (Dermatology) Kalman Shan, MD as Consulting Physician (Pulmonary Disease) Lars Masson, MD as Consulting Physician (Cardiology) Marcelle Overlie, MD as Consulting Physician (Obstetrics and Gynecology) Suzanna Obey, MD as Consulting Physician (Otolaryngology) Pyrtle, Carie Caddy, MD as Consulting Physician (Gastroenterology) Drema Dallas, DO as Consulting Physician (Neurology)  This Provider for this visit: Treatment Team:  Attending Provider: Kalman Shan, MD   Severe persistent asthma with ABPA elevated IgE and eosinophilia. S/P cinqair interleukin-5 receptor antibody treatment ending" 2018 with good response but stopped secondary to billing errors at [facility] infusion center. Based on medical need then switched to anti-eosinophil interleukin-5 receptor  antibody subcutaneous injection treatment Fasenra early 2019. She is  NOT on baseline Asmanex, Spiriva, Singulair and antihistamine 8. She is not on daily prednisone anymore.  -March 2024 medications include Alwyn Pea, Flonase, DuoNeb  Known hiatal hernia  ANA positive - saw Dr Corliss Skains Sept 2023 and reassured  09/15/2022 -   Chief Complaint  Patient presents with   Follow-up    Follow up. "A lot going on"      HPI CHARNE QUAMME 67 y.o. -returns for follow-up.  Last seen in September 2023.  Is a routine asthma  follow-up.  From an asthma perspective she is doing well.  She continues to [place].  She is yet to restart her inhaled steroid Asmanex and she wants a refill for that.  The new issues that she is having surgery for hysterectomy and bilateral salpingo-oophorectomy because of bladder prolapse.  We did a evaluation and I deem her to be low moderate risk for pulmonary complications as long as there is no asthma flareup.  The other interim issues that she had major depression and anxiety and was hospitalized and [facility] for 3 days towards the end of February 2024.  She is also undergoing breast biopsy next week.  All this is naturally upsetting for her but she did indicate that asthma itself is stable.     OV 12/23/2022  Subjective:  Patient ID: Victoria [name], female , DOB: 10-30-1955 , age 38 y.o. , MRN: [id number] , ADDRESS: [address] [place] [id number] PCP Bradd Canary, MD Patient Care Team: Bradd Canary, MD as PCP - General (Family Medicine) Meriam Sprague, MD as PCP - Cardiology (Cardiology) Haverstock, Elvin So, MD as Referring Physician (Dermatology) Kalman Shan, MD as Consulting Physician (Pulmonary Disease) Lars Masson, MD as Consulting Physician (Cardiology) Marcelle Overlie, MD as Consulting Physician (Obstetrics and Gynecology) Suzanna Obey, MD as Consulting Physician (Otolaryngology) Pyrtle, Carie Caddy, MD as Consulting Physician (Gastroenterology) Donnelly Angelica, RN as Oncology Nurse Navigator Pershing Proud, RN as Oncology Nurse Navigator Griselda Miner, MD as Consulting Physician (General Surgery) Malachy Mood, MD as Consulting Physician (Hematology) Antony Blackbird, MD as Consulting Physician (Radiation Oncology)  This Provider for this visit: Treatment Team:  Attending Provider: Kalman Shan, MD   Severe persistent asthma with ABPA elevated IgE and eosinophilia. S/P cinqair interleukin-5 receptor antibody treatment ending" 2018  with good response but stopped secondary to billing errors at [facility] infusion center. Based on medical need then switched to anti-eosinophil interleukin-5 receptor antibody subcutaneous injection treatment Fasenra early 2019. She is  NOT on baseline Asmanex, Spiriva, Singulair and antihistamine 8. She is not on daily prednisone anymore.  -March 2024 medications include Alwyn Pea, Flonase, DuoNeb  Known hiatal hernia  - sees DR Rhea Belton  ANA positive - saw Dr Corliss Skains Sept 2023 and reassured  12/23/2022 -   Chief Complaint  Patient presents with   Follow-up    Cough x 3 days.  Sx worse at bedtime.     HPI WAUKESHA KUBINSKI 67 y.o. -returns for follow-up but she is actually acutely having symptoms.  She says last week her son was ill with bronchitis but is being managed conservatively and expectantly.  This because his respiratory exam was normal.  However she tells me on 12/20/2022 she has picked up similar illness with a sore throat subsequently a cough.  She has not been able to sleep in her bed and is sleeping in a recliner because of the  cough.  Today the sputum turned green.  There is no active wheezing no fever no nausea no no vomiting no diarrhea no urgent care or emergency room visits.  Of note last visit we added Asmanex to her asthma control because she is only on Fasenra right now but she says our office did not send the Asmanex.  Will try to reinitiate this prescription.  Past medical - She had a hysterectomy 02/17/2023 and it went well.  Then got diagnosed with breast cancer on the left side on 11/04/2022 with lumpectomy.  She is scheduled for radiation for 4 weeks.  Also confirmed on external record review.   Asthma Control Test ACT Total Score  12/23/2022  2:53 PM 19  03/07/2022 11:27 AM 14  09/06/2021  1:28 PM 22     Lab Results  Component Value Date   NITRICOXIDE 42 04/21/2017      PFT     Latest Ref Rng & Units 10/19/2017   10:47 AM  PFT Results  FVC-Pre L  2.65   FVC-Predicted Pre % 86   FVC-Post L 2.57   FVC-Predicted Post % 84   Pre FEV1/FVC % % 67   Post FEV1/FCV % % 70   FEV1-Pre L 1.79   FEV1-Predicted Pre % 75   FEV1-Post L 1.80   DLCO uncorrected ml/min/mmHg 19.51   DLCO UNC% % 90   DLVA Predicted % 104   TLC L 4.90   TLC % Predicted % 103   RV % Predicted % 118        has a past medical history of Allergic bronchopulmonary aspergillosis (HCC) (2008), Anemia, Anxiety, Asthma, Breast cancer (HCC), CAD (coronary artery disease), CAP (community acquired pneumonia) (2016; 06/07/2016), Chronic bronchitis (HCC), Chronic lower back pain, Complication of anesthesia, Depression, Diverticulitis, Diverticulosis, GERD (gastroesophageal reflux disease), H/O hiatal hernia, Headache, History of echocardiogram, Hyperglycemia (11/20/2015), Hyperlipidemia, mixed (09/11/2007), IBS (irritable bowel syndrome), Maxillary sinusitis, Normal cardiac stress test (11/2011), Obesity, OSA (obstructive sleep apnea) (02/2012), Osteoarthritis, Osteoporosis, Pneumonia (11/2011), Pulmonary nodules, S/P angioplasty with stent 08/23/17 ostial 2nd OM with DES synnergy (08/24/2017), and Schatzki's ring.   reports that she has never smoked. She has never been exposed to tobacco smoke. She has never used smokeless tobacco.  Past Surgical History:  Procedure Laterality Date   ANTERIOR AND POSTERIOR REPAIR N/A 10/18/2022   Procedure: ANTERIOR (CYSTOCELE);  Surgeon: Marcelle Overlie, MD;  Location: [facility] OR;  Service: Gynecology;  Laterality: N/A;   APPENDECTOMY  1989   BREAST BIOPSY  11/03/2022   MM LT RADIOACTIVE SEED LOC MAMMO GUIDE 11/03/2022 [facility] MAMMOGRAPHY   BREAST LUMPECTOMY WITH RADIOACTIVE SEED LOCALIZATION Left 11/04/2022   Procedure: LEFT BREAST LUMPECTOMY WITH RADIOACTIVE SEED LOCALIZATION;  Surgeon: Griselda Miner, MD;  Location: [facility];  Service: General;  Laterality: Left;   CARDIAC CATHETERIZATION N/A 11/25/2014   Procedure: Right/Left Heart  Cath and Coronary Angiography;  Surgeon: Lyn Records, MD;  [facility] INVAS[facility] CV LAB;  Service: Cardiovascular;  Laterality: N/A;   CESAREAN SECTION  1985   COLONOSCOPY  03/2022   CORONARY ANGIOPLASTY WITH STENT PLACEMENT  08/23/2017   CORONARY STENT INTERVENTION N/A 08/23/2017   Procedure: CORONARY STENT INTERVENTION;  Surgeon: Kathleene Hazel, MD;  Location: [facility] INVASIVE CV LAB;  Service: Cardiovascular;  Laterality: N/A;   HERNIA REPAIR  04/13/2012   VHR laparoscopic   LAPAROSCOPIC BILATERAL SALPINGO OOPHERECTOMY Bilateral 10/18/2022   Procedure: LAPAROTOMY WITH BILATERAL SALPINGO OOPHORECTOMY;  Surgeon: Marcelle Overlie, MD;  Location:  [facility] OR;  Service: Gynecology;  Laterality: Bilateral;   LEFT HEART CATH AND CORONARY ANGIOGRAPHY N/A 08/23/2017   Procedure: LEFT HEART CATH AND CORONARY ANGIOGRAPHY;  Surgeon: Kathleene Hazel, MD;  Location: [facility] INVASIVE CV LAB;  Service: Cardiovascular;  Laterality: N/A;   VAGINAL HYSTERECTOMY N/A 10/18/2022   Procedure: HYSTERECTOMY VAGINAL;  Surgeon: Marcelle Overlie, MD;  Location: [facility] OR;  Service: Gynecology;  Laterality: N/A;   VENTRAL HERNIA REPAIR  04/13/2012   Procedure: LAPAROSCOPIC VENTRAL HERNIA;  Surgeon: [name], MD;  Location: [facility] OR;  Service: General;  Laterality: N/A;  laparoscopic repair of incarcerated hernia    Allergies  Allergen Reactions   Latuda [Lurasidone Hcl] Other (See Comments)    PER THE PT CAUSED RESTLESSNESS   Beclomethasone Dipropionate Hives and Other (See Comments)     weight gain   Flexeril [Cyclobenzaprine] Anxiety   Mometasone Furo-Formoterol Fum Hives and Other (See Comments)    weight gain   Sulfonamide Derivatives Hives and Rash   Statins Other (See Comments)    Myalgias, RLS    Immunization History  Administered Date(s) Administered   Influenza Split 04/20/2011, 04/20/2020   Influenza Whole 06/06/2007, 04/15/2008, 04/02/2009, 03/29/2012   Influenza, High Dose Seasonal PF  05/05/2015   Influenza,inj,Quad PF,6+ Mos 05/09/2013, 03/03/2014, 05/03/2016, 04/21/2017, 04/02/2018, 03/19/2019, 04/20/2020, 05/03/2021, 03/07/2022   Moderna Sars-Covid-2 Vaccination 08/15/2019, 09/17/2019, 05/16/2020, 12/04/2020   Pfizer Covid-19 Vaccine Bivalent Booster 32yrs & up 04/16/2021   Pneumococcal Conjugate-13 05/09/2013   Pneumococcal Polysaccharide-23 05/04/2005   Td 07/29/2009   Tdap 03/11/2015   Zoster Recombinant(Shingrix) 05/09/2019, 08/01/2019    Family History  Problem Relation Age of Onset   Breast cancer Mother 19       bilateral breast cancer dx. 89, met to liver   Hypertension Mother    Diabetes Mother    Diverticulosis Father    Prostate cancer Father    Breast cancer Sister 78       DCIS at 93, IDC at 78, PALB2+   Cancer Sister        breast cancer, invasive ductal carcinoma in 2022,DCIS at 63 with 4 weeks of radiation, 5 years of Tamoxifen    Pulmonary embolism Brother        recurrent   Heart attack Maternal Grandfather    Ovarian cancer Paternal Grandmother    Cerebral palsy Son    Prostate cancer Paternal Uncle    Breast cancer Niece 54       PALB2+   Osteoporosis Niece    Stroke Neg Hx    Colon cancer Neg Hx    Esophageal cancer Neg Hx    Stomach cancer Neg Hx    Rectal cancer Neg Hx      Current Outpatient Medications:    acetaminophen (TYLENOL) 650 MG CR tablet, Take 650 mg by mouth every 8 (eight) hours as needed for pain., Disp: , Rfl:    albuterol (VENTOLIN HFA) 108 (90 Base) MCG/ACT inhaler, Inhale 1-2 puffs into the lungs every 6 (six) hours as needed., Disp: 8 g, Rfl: 2   ascorbic acid (VITAMIN C) 500 MG tablet, Take 500 mg by mouth daily., Disp: , Rfl:    aspirin EC 81 MG tablet, Take 81 mg by mouth daily. Swallow whole., Disp: , Rfl:    Benralizumab (FASENRA PEN) 30 MG/ML SOAJ, Inject 1 mL (30 mg total) into the skin every 8 (eight) weeks., Disp: 1 mL, Rfl: 2   budesonide (ENTOCORT EC) 3 MG 24 hr capsule, Take  3 mg by mouth  daily., Disp: , Rfl:    Calcium-Magnesium-Vitamin D (CALCIUM MAGNESIUM PO), Take 1 tablet by mouth at bedtime., Disp: , Rfl:    Cholecalciferol (VITAMIN D3 ULTRA STRENGTH) 125 MCG (5000 UT) capsule, Take 5,000 Units by mouth daily., Disp: , Rfl:    [START ON 01/02/2023] clonazePAM (KLONOPIN) 0.5 MG tablet, Take 1 tablet (0.5 mg total) by mouth daily., Disp: 30 tablet, Rfl: 0   denosumab (PROLIA) 60 MG/ML SOSY injection, Inject 60 mg into the skin every 6 (six) months., Disp: , Rfl:    doxepin (SINEQUAN) 25 MG capsule, Take 1 capsule (25 mg total) by mouth at bedtime., Disp: 30 capsule, Rfl: 3   Evolocumab (REPATHA SURECLICK) 140 MG/ML SOAJ, Inject 140 mg into the skin every 21 ( twenty-one) days., Disp: , Rfl:    famotidine (PEPCID) 20 MG tablet, TAKE 1 TABLET BY MOUTH TWICE A DAY, Disp: 60 tablet, Rfl: 11   ibuprofen (ADVIL) 200 MG tablet, Take 400 mg by mouth every 6 (six) hours as needed for moderate pain., Disp: , Rfl:    ipratropium-albuterol (DUONEB) 0.5-2.5 (3) MG/3ML SOLN, Take 3 mLs by nebulization every 6 (six) hours as needed., Disp: 120 mL, Rfl: 5   nitroGLYCERIN (NITROSTAT) 0.4 MG SL tablet, Place 1 tablet (0.4 mg total) under the tongue every 5 (five) minutes as needed for chest pain., Disp: 25 tablet, Rfl: 11   oxyCODONE (ROXICODONE) 5 MG immediate release tablet, Take 1 tablet (5 mg total) by mouth every 6 (six) hours as needed for severe pain., Disp: 10 tablet, Rfl: 0   pindolol (VISKEN) 5 MG tablet, Take 1 tablet (5 mg total) by mouth daily after breakfast., Disp: 30 tablet, Rfl: 3   risperiDONE (RISPERDAL) 0.5 MG tablet, Take 1 tablet (0.5 mg total) by mouth 2 (two) times daily at 8 am and 4 pm., Disp: 60 tablet, Rfl: 3   venlafaxine XR (EFFEXOR XR) 75 MG 24 hr capsule, Take 3 capsules (225 mg total) by mouth daily with breakfast., Disp: 90 capsule, Rfl: 1      Objective:   Vitals:   12/23/22 1453  BP: 102/64  Pulse: 77  Temp: 98.3 F (36.8 C)  TempSrc: Oral  SpO2: 98%   Weight: 162 lb 12.8 oz (73.8 kg)  Height: 5\' 1"  (1.549 m)    Estimated body mass index is 30.76 kg/m as calculated from the following:   Height as of this encounter: 5\' 1"  (1.549 m).   Weight as of this encounter: 162 lb 12.8 oz (73.8 kg).  @WEIGHTCHANGE @  [employer]   12/23/22 1453  Weight: 162 lb 12.8 oz (73.8 kg)     Physical Exam   General: No distress.  O2 at rest: no Cane present: no Sitting in wheel chair: no Frail: no Obese: YES .  Neuro: Alert and Oriented x 3. GCS 15. Speech normal Psych: Pleasant Resp:  Barrel Chest - no.  Wheeze - no, Crackles - no, No overt respiratory distress CVS: Normal heart sounds. Murmurs - no Ext: Stigmata of Connective Tissue Disease - no HEENT: Normal upper airway. PEERL +. No post nasal drip        Assessment:     No diagnosis found.     Plan:     Patient Instructions  Severe persistent asthma with intensive monitoring A B P A-ALLERGIC BRONCHOPULMONARY ASPERGILLOSIS Moderate persistent asthmatic bronchitis with acute exacerbation  -Asthma in some flare up - Not sure why our office did not send Asmanex  Plan -continue with Fasenra  - BUT   retstart asmanex low dose 2 puff twice daily  - [facility]MA to do refill 12/23/2022 - Please take prednisone 40 mg x1 day, then 30 mg x1 day, then 20 mg x1 day, then 10 mg x1 day, and then 5 mg x1 day and stop - Take doxycycline 100mg  po twice daily x 5 days; take after meals and avoid sunlight - continue albuterol as needed - best wishes for breaast cancer treament  - flu and RSV vaccines in the fall 2024     Followup 4-5 months or sooner if needed    SIGNATURE    Dr. Kalman Shan, M.D., F.C.C.P,  Pulmonary and Critical Care Medicine Staff Physician, [name] Director - Interstitial Lung Disease  Program  Pulmonary Fibrosis [facility] at [name] [place], [place], [address]  Pager: [phone], If no answer or between   15:00h - 7:00h: call [phone] Telephone: [phone]  3:13 PM 12/23/2022

## 2022-12-23 NOTE — Patient Instructions (Addendum)
Severe persistent asthma with intensive monitoring A B P A-ALLERGIC BRONCHOPULMONARY ASPERGILLOSIS Moderate persistent asthmatic bronchitis with acute exacerbation  -Asthma in some flare up - Not sure why our office did not send Asmanex  Plan -continue with Fasenra  - BUT   retstart asmanex low dose 2 puff twice daily  - [facility]MA to do refill 12/23/2022 - Please take prednisone 40 mg x1 day, then 30 mg x1 day, then 20 mg x1 day, then 10 mg x1 day, and then 5 mg x1 day and stop - Take doxycycline 100mg  po twice daily x 5 days; take after meals and avoid sunlight - continue albuterol as needed - best wishes for breaast cancer treament  - flu and RSV vaccines in the fall 2024     Followup 4-5 months or sooner if needed

## 2022-12-26 ENCOUNTER — Other Ambulatory Visit: Payer: Self-pay

## 2022-12-26 ENCOUNTER — Ambulatory Visit
Admission: RE | Admit: 2022-12-26 | Discharge: 2022-12-26 | Disposition: A | Discharge status: Home / Self Care | Payer: PPO | Source: Ambulatory Visit | Attending: Radiation Oncology | Admitting: Radiation Oncology

## 2022-12-26 DIAGNOSIS — Z510 Encounter for antineoplastic radiation therapy: Secondary | ICD-10-CM | POA: Diagnosis not present

## 2022-12-26 DIAGNOSIS — C50512 Malignant neoplasm of lower-outer quadrant of left female breast: Secondary | ICD-10-CM | POA: Insufficient documentation

## 2022-12-26 DIAGNOSIS — Z170 Estrogen receptor positive status [ER+]: Secondary | ICD-10-CM | POA: Diagnosis not present

## 2022-12-26 LAB — RAD ONC ARIA SESSION SUMMARY
Course Elapsed Days: 0
Plan Fractions Treated to Date: 1
Plan Prescribed Dose Per Fraction: 2.67 Gy
Plan Total Fractions Prescribed: 15
Plan Total Prescribed Dose: 40.05 Gy
Reference Point Dosage Given to Date: 2.67 Gy
Reference Point Session Dosage Given: 2.67 Gy
Session Number: 1

## 2022-12-27 ENCOUNTER — Other Ambulatory Visit: Payer: Self-pay

## 2022-12-27 ENCOUNTER — Ambulatory Visit
Admission: RE | Admit: 2022-12-27 | Discharge: 2022-12-27 | Disposition: A | Discharge status: Home / Self Care | Payer: PPO | Source: Ambulatory Visit | Attending: Radiation Oncology | Admitting: Radiation Oncology

## 2022-12-27 DIAGNOSIS — Z170 Estrogen receptor positive status [ER+]: Secondary | ICD-10-CM | POA: Diagnosis not present

## 2022-12-27 DIAGNOSIS — C50512 Malignant neoplasm of lower-outer quadrant of left female breast: Secondary | ICD-10-CM | POA: Diagnosis not present

## 2022-12-27 DIAGNOSIS — Z510 Encounter for antineoplastic radiation therapy: Secondary | ICD-10-CM | POA: Diagnosis not present

## 2022-12-27 LAB — RAD ONC ARIA SESSION SUMMARY
Course Elapsed Days: 1
Plan Fractions Treated to Date: 2
Plan Prescribed Dose Per Fraction: 2.67 Gy
Plan Total Fractions Prescribed: 15
Plan Total Prescribed Dose: 40.05 Gy
Reference Point Dosage Given to Date: 5.34 Gy
Reference Point Session Dosage Given: 2.67 Gy
Session Number: 2

## 2022-12-27 MED ORDER — RADIAPLEXRX EX GEL: Freq: Once | CUTANEOUS | Status: AC

## 2022-12-27 MED ORDER — ALRA NON-METALLIC DEODORANT (RAD-ONC)
1.0000 | Freq: Once | TOPICAL | Status: AC
  Administered 2022-12-27: 1 via TOPICAL

## 2022-12-28 ENCOUNTER — Ambulatory Visit
Admission: RE | Admit: 2022-12-28 | Discharge: 2022-12-28 | Disposition: A | Discharge status: Home / Self Care | Payer: PPO | Source: Ambulatory Visit | Attending: Radiation Oncology | Admitting: Radiation Oncology

## 2022-12-28 ENCOUNTER — Other Ambulatory Visit: Payer: Self-pay

## 2022-12-28 DIAGNOSIS — Z510 Encounter for antineoplastic radiation therapy: Secondary | ICD-10-CM | POA: Diagnosis not present

## 2022-12-28 DIAGNOSIS — Z170 Estrogen receptor positive status [ER+]: Secondary | ICD-10-CM | POA: Diagnosis not present

## 2022-12-28 DIAGNOSIS — C50512 Malignant neoplasm of lower-outer quadrant of left female breast: Secondary | ICD-10-CM | POA: Diagnosis not present

## 2022-12-28 LAB — RAD ONC ARIA SESSION SUMMARY
Course Elapsed Days: 2
Plan Fractions Treated to Date: 3
Plan Prescribed Dose Per Fraction: 2.67 Gy
Plan Total Fractions Prescribed: 15
Plan Total Prescribed Dose: 40.05 Gy
Reference Point Dosage Given to Date: 8.01 Gy
Reference Point Session Dosage Given: 2.67 Gy
Session Number: 3

## 2022-12-29 ENCOUNTER — Encounter: Payer: Self-pay | Admitting: *Deleted

## 2022-12-29 ENCOUNTER — Ambulatory Visit
Admission: RE | Admit: 2022-12-29 | Discharge: 2022-12-29 | Disposition: A | Discharge status: Home / Self Care | Payer: PPO | Source: Ambulatory Visit | Attending: Radiation Oncology | Admitting: Radiation Oncology

## 2022-12-29 ENCOUNTER — Other Ambulatory Visit: Payer: Self-pay

## 2022-12-29 DIAGNOSIS — C50512 Malignant neoplasm of lower-outer quadrant of left female breast: Secondary | ICD-10-CM

## 2022-12-29 DIAGNOSIS — Z170 Estrogen receptor positive status [ER+]: Secondary | ICD-10-CM | POA: Diagnosis not present

## 2022-12-29 DIAGNOSIS — Z510 Encounter for antineoplastic radiation therapy: Secondary | ICD-10-CM | POA: Diagnosis not present

## 2022-12-29 LAB — RAD ONC ARIA SESSION SUMMARY
Course Elapsed Days: 3
Plan Fractions Treated to Date: 4
Plan Prescribed Dose Per Fraction: 2.67 Gy
Plan Total Fractions Prescribed: 15
Plan Total Prescribed Dose: 40.05 Gy
Reference Point Dosage Given to Date: 10.68 Gy
Reference Point Session Dosage Given: 2.67 Gy
Session Number: 4

## 2022-12-30 ENCOUNTER — Ambulatory Visit
Admission: RE | Admit: 2022-12-30 | Discharge: 2022-12-30 | Disposition: A | Discharge status: Home / Self Care | Payer: PPO | Source: Ambulatory Visit | Attending: Radiation Oncology | Admitting: Radiation Oncology

## 2022-12-30 ENCOUNTER — Other Ambulatory Visit: Payer: Self-pay

## 2022-12-30 ENCOUNTER — Telehealth: Payer: Self-pay | Admitting: Family Medicine

## 2022-12-30 DIAGNOSIS — Z170 Estrogen receptor positive status [ER+]: Secondary | ICD-10-CM | POA: Diagnosis not present

## 2022-12-30 DIAGNOSIS — Z510 Encounter for antineoplastic radiation therapy: Secondary | ICD-10-CM | POA: Diagnosis not present

## 2022-12-30 DIAGNOSIS — C50512 Malignant neoplasm of lower-outer quadrant of left female breast: Secondary | ICD-10-CM | POA: Diagnosis not present

## 2022-12-30 LAB — RAD ONC ARIA SESSION SUMMARY
Course Elapsed Days: 4
Plan Fractions Treated to Date: 5
Plan Prescribed Dose Per Fraction: 2.67 Gy
Plan Total Fractions Prescribed: 15
Plan Total Prescribed Dose: 40.05 Gy
Reference Point Dosage Given to Date: 13.35 Gy
Reference Point Session Dosage Given: 2.67 Gy
Session Number: 5

## 2022-12-30 NOTE — Telephone Encounter (Signed)
Pt called stating that she needs a referral to go back to [facility] for [facility] at [facility]. Tele # is [phone].

## 2023-01-01 ENCOUNTER — Other Ambulatory Visit: Payer: Self-pay | Admitting: Family Medicine

## 2023-01-01 DIAGNOSIS — M5416 Radiculopathy, lumbar region: Secondary | ICD-10-CM

## 2023-01-02 ENCOUNTER — Ambulatory Visit
Admission: RE | Admit: 2023-01-02 | Discharge: 2023-01-02 | Disposition: A | Discharge status: Home / Self Care | Payer: PPO | Source: Ambulatory Visit | Attending: Radiation Oncology | Admitting: Radiation Oncology

## 2023-01-02 ENCOUNTER — Other Ambulatory Visit: Payer: Self-pay

## 2023-01-02 DIAGNOSIS — C50512 Malignant neoplasm of lower-outer quadrant of left female breast: Secondary | ICD-10-CM | POA: Diagnosis not present

## 2023-01-02 DIAGNOSIS — Z170 Estrogen receptor positive status [ER+]: Secondary | ICD-10-CM | POA: Diagnosis not present

## 2023-01-02 DIAGNOSIS — Z510 Encounter for antineoplastic radiation therapy: Secondary | ICD-10-CM | POA: Diagnosis not present

## 2023-01-02 LAB — RAD ONC ARIA SESSION SUMMARY
Course Elapsed Days: 7
Plan Fractions Treated to Date: 6
Plan Prescribed Dose Per Fraction: 2.67 Gy
Plan Total Fractions Prescribed: 15
Plan Total Prescribed Dose: 40.05 Gy
Reference Point Dosage Given to Date: 16.02 Gy
Reference Point Session Dosage Given: 2.67 Gy
Session Number: 6

## 2023-01-03 ENCOUNTER — Ambulatory Visit
Admission: RE | Admit: 2023-01-03 | Discharge: 2023-01-03 | Disposition: A | Discharge status: Home / Self Care | Payer: PPO | Source: Ambulatory Visit | Attending: Radiation Oncology | Admitting: Radiation Oncology

## 2023-01-03 ENCOUNTER — Other Ambulatory Visit: Payer: Self-pay

## 2023-01-03 DIAGNOSIS — Z170 Estrogen receptor positive status [ER+]: Secondary | ICD-10-CM | POA: Diagnosis not present

## 2023-01-03 DIAGNOSIS — C50512 Malignant neoplasm of lower-outer quadrant of left female breast: Secondary | ICD-10-CM | POA: Diagnosis not present

## 2023-01-03 DIAGNOSIS — Z510 Encounter for antineoplastic radiation therapy: Secondary | ICD-10-CM | POA: Diagnosis not present

## 2023-01-03 LAB — RAD ONC ARIA SESSION SUMMARY
Course Elapsed Days: 8
Plan Fractions Treated to Date: 7
Plan Prescribed Dose Per Fraction: 2.67 Gy
Plan Total Fractions Prescribed: 15
Plan Total Prescribed Dose: 40.05 Gy
Reference Point Dosage Given to Date: 18.69 Gy
Reference Point Session Dosage Given: 2.67 Gy
Session Number: 7

## 2023-01-04 ENCOUNTER — Ambulatory Visit
Admission: RE | Admit: 2023-01-04 | Discharge: 2023-01-04 | Disposition: A | Discharge status: Home / Self Care | Payer: PPO | Source: Ambulatory Visit | Attending: Radiation Oncology | Admitting: Radiation Oncology

## 2023-01-04 ENCOUNTER — Other Ambulatory Visit: Payer: Self-pay

## 2023-01-04 DIAGNOSIS — Z510 Encounter for antineoplastic radiation therapy: Secondary | ICD-10-CM | POA: Diagnosis not present

## 2023-01-04 DIAGNOSIS — Z170 Estrogen receptor positive status [ER+]: Secondary | ICD-10-CM | POA: Diagnosis not present

## 2023-01-04 DIAGNOSIS — C50512 Malignant neoplasm of lower-outer quadrant of left female breast: Secondary | ICD-10-CM | POA: Diagnosis not present

## 2023-01-04 LAB — RAD ONC ARIA SESSION SUMMARY
Course Elapsed Days: 9
Plan Fractions Treated to Date: 8
Plan Prescribed Dose Per Fraction: 2.67 Gy
Plan Total Fractions Prescribed: 15
Plan Total Prescribed Dose: 40.05 Gy
Reference Point Dosage Given to Date: 21.36 Gy
Reference Point Session Dosage Given: 2.67 Gy
Session Number: 8

## 2023-01-05 ENCOUNTER — Other Ambulatory Visit: Payer: Self-pay

## 2023-01-05 ENCOUNTER — Ambulatory Visit
Admission: RE | Admit: 2023-01-05 | Discharge: 2023-01-05 | Disposition: A | Discharge status: Home / Self Care | Payer: PPO | Source: Ambulatory Visit | Attending: Radiation Oncology | Admitting: Radiation Oncology

## 2023-01-05 DIAGNOSIS — C50512 Malignant neoplasm of lower-outer quadrant of left female breast: Secondary | ICD-10-CM | POA: Diagnosis not present

## 2023-01-05 DIAGNOSIS — Z170 Estrogen receptor positive status [ER+]: Secondary | ICD-10-CM | POA: Diagnosis not present

## 2023-01-05 DIAGNOSIS — Z510 Encounter for antineoplastic radiation therapy: Secondary | ICD-10-CM | POA: Diagnosis not present

## 2023-01-05 LAB — RAD ONC ARIA SESSION SUMMARY
Course Elapsed Days: 10
Plan Fractions Treated to Date: 9
Plan Prescribed Dose Per Fraction: 2.67 Gy
Plan Total Fractions Prescribed: 15
Plan Total Prescribed Dose: 40.05 Gy
Reference Point Dosage Given to Date: 24.03 Gy
Reference Point Session Dosage Given: 2.67 Gy
Session Number: 9

## 2023-01-06 ENCOUNTER — Ambulatory Visit: Payer: PPO

## 2023-01-09 ENCOUNTER — Other Ambulatory Visit: Payer: Self-pay

## 2023-01-09 ENCOUNTER — Ambulatory Visit
Admission: RE | Admit: 2023-01-09 | Discharge: 2023-01-09 | Disposition: A | Discharge status: Home / Self Care | Payer: PPO | Source: Ambulatory Visit | Attending: Radiation Oncology | Admitting: Radiation Oncology

## 2023-01-09 DIAGNOSIS — C50512 Malignant neoplasm of lower-outer quadrant of left female breast: Secondary | ICD-10-CM | POA: Diagnosis not present

## 2023-01-09 DIAGNOSIS — Z170 Estrogen receptor positive status [ER+]: Secondary | ICD-10-CM | POA: Diagnosis not present

## 2023-01-09 DIAGNOSIS — Z510 Encounter for antineoplastic radiation therapy: Secondary | ICD-10-CM | POA: Diagnosis not present

## 2023-01-09 LAB — RAD ONC ARIA SESSION SUMMARY
Course Elapsed Days: 14
Plan Fractions Treated to Date: 10
Plan Prescribed Dose Per Fraction: 2.67 Gy
Plan Total Fractions Prescribed: 15
Plan Total Prescribed Dose: 40.05 Gy
Reference Point Dosage Given to Date: 26.7 Gy
Reference Point Session Dosage Given: 2.67 Gy
Session Number: 10

## 2023-01-10 ENCOUNTER — Ambulatory Visit: Admission: RE | Admit: 2023-01-10 | Payer: PPO | Source: Ambulatory Visit

## 2023-01-10 ENCOUNTER — Ambulatory Visit
Admission: RE | Admit: 2023-01-10 | Discharge: 2023-01-10 | Disposition: A | Discharge status: Home / Self Care | Payer: PPO | Source: Ambulatory Visit | Attending: Radiation Oncology | Admitting: Radiation Oncology

## 2023-01-10 ENCOUNTER — Other Ambulatory Visit: Payer: Self-pay

## 2023-01-10 DIAGNOSIS — Z510 Encounter for antineoplastic radiation therapy: Secondary | ICD-10-CM | POA: Diagnosis not present

## 2023-01-10 DIAGNOSIS — C50512 Malignant neoplasm of lower-outer quadrant of left female breast: Secondary | ICD-10-CM | POA: Diagnosis not present

## 2023-01-10 DIAGNOSIS — Z170 Estrogen receptor positive status [ER+]: Secondary | ICD-10-CM | POA: Diagnosis not present

## 2023-01-10 LAB — RAD ONC ARIA SESSION SUMMARY
Course Elapsed Days: 15
Plan Fractions Treated to Date: 11
Plan Prescribed Dose Per Fraction: 2.67 Gy
Plan Total Fractions Prescribed: 15
Plan Total Prescribed Dose: 40.05 Gy
Reference Point Dosage Given to Date: 29.37 Gy
Reference Point Session Dosage Given: 2.67 Gy
Session Number: 11

## 2023-01-11 ENCOUNTER — Ambulatory Visit
Admission: RE | Admit: 2023-01-11 | Discharge: 2023-01-11 | Disposition: A | Discharge status: Home / Self Care | Payer: PPO | Source: Ambulatory Visit | Attending: Radiation Oncology | Admitting: Radiation Oncology

## 2023-01-11 ENCOUNTER — Other Ambulatory Visit: Payer: Self-pay

## 2023-01-11 DIAGNOSIS — Z170 Estrogen receptor positive status [ER+]: Secondary | ICD-10-CM | POA: Diagnosis not present

## 2023-01-11 DIAGNOSIS — C50512 Malignant neoplasm of lower-outer quadrant of left female breast: Secondary | ICD-10-CM | POA: Diagnosis not present

## 2023-01-11 DIAGNOSIS — Z510 Encounter for antineoplastic radiation therapy: Secondary | ICD-10-CM | POA: Diagnosis not present

## 2023-01-11 LAB — RAD ONC ARIA SESSION SUMMARY
Course Elapsed Days: 16
Plan Fractions Treated to Date: 12
Plan Prescribed Dose Per Fraction: 2.67 Gy
Plan Total Fractions Prescribed: 15
Plan Total Prescribed Dose: 40.05 Gy
Reference Point Dosage Given to Date: 32.04 Gy
Reference Point Session Dosage Given: 2.67 Gy
Session Number: 12

## 2023-01-12 ENCOUNTER — Other Ambulatory Visit: Payer: Self-pay

## 2023-01-12 ENCOUNTER — Inpatient Hospital Stay: Payer: PPO

## 2023-01-12 ENCOUNTER — Ambulatory Visit
Admission: RE | Admit: 2023-01-12 | Discharge: 2023-01-12 | Disposition: A | Discharge status: Home / Self Care | Payer: PPO | Source: Ambulatory Visit | Attending: Radiation Oncology | Admitting: Radiation Oncology

## 2023-01-12 ENCOUNTER — Inpatient Hospital Stay: Payer: PPO | Admitting: Hematology

## 2023-01-12 ENCOUNTER — Encounter: Payer: Self-pay | Admitting: Hematology

## 2023-01-12 VITALS — BP 102/52 | HR 74 | Temp 98.1°F | Resp 16 | Ht 61.0 in | Wt 164.9 lb

## 2023-01-12 DIAGNOSIS — Z923 Personal history of irradiation: Secondary | ICD-10-CM | POA: Insufficient documentation

## 2023-01-12 DIAGNOSIS — Z79810 Long term (current) use of selective estrogen receptor modulators (SERMs): Secondary | ICD-10-CM | POA: Insufficient documentation

## 2023-01-12 DIAGNOSIS — Z170 Estrogen receptor positive status [ER+]: Secondary | ICD-10-CM | POA: Insufficient documentation

## 2023-01-12 DIAGNOSIS — C50512 Malignant neoplasm of lower-outer quadrant of left female breast: Secondary | ICD-10-CM | POA: Insufficient documentation

## 2023-01-12 DIAGNOSIS — Z510 Encounter for antineoplastic radiation therapy: Secondary | ICD-10-CM | POA: Diagnosis not present

## 2023-01-12 DIAGNOSIS — M810 Age-related osteoporosis without current pathological fracture: Secondary | ICD-10-CM | POA: Insufficient documentation

## 2023-01-12 LAB — CBC WITH DIFFERENTIAL (CANCER CENTER ONLY)
Abs Immature Granulocytes: 0.03 10*3/uL (ref 0.00–0.07)
Basophils Absolute: 0 10*3/uL (ref 0.0–0.1)
Basophils Relative: 0 %
Eosinophils Absolute: 0 10*3/uL (ref 0.0–0.5)
Eosinophils Relative: 0 %
HCT: 35.5 % — ABNORMAL LOW (ref 36.0–46.0)
Hemoglobin: 11.7 g/dL — ABNORMAL LOW (ref 12.0–15.0)
Immature Granulocytes: 1 %
Lymphocytes Relative: 15 %
Lymphs Abs: 0.9 10*3/uL (ref 0.7–4.0)
MCH: 29.8 pg (ref 26.0–34.0)
MCHC: 33 g/dL (ref 30.0–36.0)
MCV: 90.3 fL (ref 80.0–100.0)
Monocytes Absolute: 0.4 10*3/uL (ref 0.1–1.0)
Monocytes Relative: 8 %
Neutro Abs: 4.5 10*3/uL (ref 1.7–7.7)
Neutrophils Relative %: 76 %
Platelet Count: 239 10*3/uL (ref 150–400)
RBC: 3.93 MIL/uL (ref 3.87–5.11)
RDW: 13.1 % (ref 11.5–15.5)
WBC Count: 5.8 10*3/uL (ref 4.0–10.5)
nRBC: 0 % (ref 0.0–0.2)

## 2023-01-12 LAB — RAD ONC ARIA SESSION SUMMARY
Course Elapsed Days: 17
Plan Fractions Treated to Date: 13
Plan Prescribed Dose Per Fraction: 2.67 Gy
Plan Total Fractions Prescribed: 15
Plan Total Prescribed Dose: 40.05 Gy
Reference Point Dosage Given to Date: 34.71 Gy
Reference Point Session Dosage Given: 2.67 Gy
Session Number: 13

## 2023-01-12 LAB — CMP (CANCER CENTER ONLY)
ALT: 15 U/L (ref 0–44)
AST: 15 U/L (ref 15–41)
Albumin: 3.9 g/dL (ref 3.5–5.0)
Alkaline Phosphatase: 78 U/L (ref 38–126)
Anion gap: 6 (ref 5–15)
BUN: 15 mg/dL (ref 8–23)
CO2: 31 mmol/L (ref 22–32)
Calcium: 9.3 mg/dL (ref 8.9–10.3)
Chloride: 103 mmol/L (ref 98–111)
Creatinine: 0.88 mg/dL (ref 0.44–1.00)
GFR, Estimated: >60 mL/min (ref >60)
Glucose, Bld: 85 mg/dL (ref 70–99)
Potassium: 3.8 mmol/L (ref 3.5–5.1)
Sodium: 140 mmol/L (ref 135–145)
Total Bilirubin: 0.3 mg/dL (ref 0.3–1.2)
Total Protein: 6.1 g/dL — ABNORMAL LOW (ref 6.5–8.1)

## 2023-01-12 MED ORDER — TAMOXIFEN CITRATE 20 MG PO TABS: 20.0000 mg | ORAL_TABLET | Freq: Every day | ORAL | 3 refills | Status: DC

## 2023-01-12 NOTE — Progress Notes (Signed)
[name]   [phone] [phone]   Clinic Follow up Note   Patient Care Team: Bradd Canary, MD as PCP - General (Family Medicine) Meriam Sprague, MD as PCP - Cardiology (Cardiology) Haverstock, Elvin So, MD as Referring Physician (Dermatology) Kalman Shan, MD as Consulting Physician (Pulmonary Disease) Lars Masson, MD as Consulting Physician (Cardiology) Marcelle Overlie, MD as Consulting Physician (Obstetrics and Gynecology) Suzanna Obey, MD as Consulting Physician (Otolaryngology) Pyrtle, Carie Caddy, MD as Consulting Physician (Gastroenterology) Donnelly Angelica, RN as Oncology Nurse Navigator Pershing Proud, RN as Oncology Nurse Navigator Griselda Miner, MD as Consulting Physician (General Surgery) Malachy Mood, MD as Consulting Physician (Hematology) Antony Blackbird, MD as Consulting Physician (Radiation Oncology)  Date of Service:  01/12/2023  CHIEF COMPLAINT: f/u of Left Breast Cancer, ER positive   CURRENT THERAPY:  Tamoxifen 20 mg starting 01/2023  ASSESSMENT:  Victoria [name] is a 66 y.o. female with   Primary malignant neoplasm of lower-outer quadrant of breast, left (HCC) invasive ductal carcinoma, stage Ia, pT1aN0M0, ER+/PR+/HER2-, G1 --We discussed her imaging findings and the biopsy results in great details. -Giving the very early disease, she likely a candidate for lumpectomy. She is agreeable with that. She was seen by Dr. Carolynne Edouard today and likely will proceed with surgery soon.  Given her age and small primary tumor, it is reasonable to forego sentinel lymph node biopsy. -Given the small tumor, no Oncotype or adjuvant chemotherapy needed.  Given the favorable prognostic factors, this is likely low risk disease. -she underwent left breast lumpectomy on Nov 04, 2022, I reviewed her surgical path. -She started adjuvant radiation on December 26, 2022, she is tolerating well -Plan to start her on adjuvant tamoxifen next  months ---The potential side effects, which includes but not limited to, hot flash, skin and vaginal dryness, slightly increased risk of cardiovascular disease and cataract, small risk of thrombosis and endometrial cancer, were discussed with her in great details. Preventive strategies for thrombosis, such as being physically active, using compression stocks, avoid cigarette smoking, etc., were reviewed with her. I also recommend her to follow-up with her gynecologist once a year, and watch for vaginal spotting or bleeding, as a clinically sign of endometrial cancer, etc. She voiced good understanding, and agrees to proceed. Will start after she completes adjuvant breast radiation.  She agrees to proceed.  I called in today. -Survivorship in 3 months and I will see her back in 6 months.  Osteoporosis -She is on Prolia injection every 6 months, and vitamin D. -We discussed the impact of antiestrogen therapy on her bone density, she will repeat a bone density every 2 years       PLAN: -no chemotherapy -recommend antiestrogen therapy after radiation. - pt agree to Tamoxifen for 5 years, to start two weeks after radiation. Lab and f/u in 3 months with NP Lacie    SUMMARY OF ONCOLOGIC HISTORY: Oncology History Overview Note   Cancer Staging  Primary malignant neoplasm of lower-outer quadrant of breast, left Staging form: Breast, AJCC 8th Edition - Clinical stage from 10/10/2022: cT1a, cN0, cM0, G1 - Unsigned Stage prefix: Initial diagnosis Method of lymph node assessment: Clinical Histologic grading system: 3 grade system     Primary malignant neoplasm of lower-outer quadrant of breast, left (HCC)  10/10/2022 Initial Diagnosis   Primary malignant neoplasm of lower-outer quadrant of breast, left   11/04/2022 Cancer Staging   Staging form: Breast, AJCC 8th Edition - Pathologic stage  from 11/04/2022: Stage Unknown (pT1a, pNX, G1, ER+, PR+, HER2-) - Signed by Malachy Mood, MD on  01/12/2023 Histologic grading system: 3 grade system Residual tumor (R): R0 - None   11/04/2022 Surgery   LEFT BREAST LUMPECTOMY WITH RADIOACTIVE SEED LOCALIZATION   Surgeon:Toth, Lynetta Mare, MD    11/04/2022 Pathology Results    FINAL MICROSCOPIC DIAGNOSIS:  A. BREAST, LEFT, LUMPECTOMY:      Invasive ductal carcinoma, 0.3 cm, grade 1 Ductal carcinoma in situ:  Not identified Margins, invasive:  Negative           Closest, invasive:  Posterior margin Margins, DCIS:  NA           Closest, DCIS:  NA Lymphovascular invasion:  Not identified Prognostic markers: ER: 100%, Positive, strong staining intensity PR: 100%, Positive, strong staining intensity Her2: Negative, 0 Ki-67: 5% Other:  Sclerosing adenosis, microcysts, dilated mammary ducts      Biopsy sites and clips identified See oncology table  B. BREAST, LEFT LATERAL MARGIN, EXCISION:      Benign breast tissue with sclerosing adenosis and microcysts.      Negative for malignancy.  C. BREAST, LEFT ANTERIOR MARGIN, EXCISION:      Benign breast tissue with sclerosing adenosis, microcysts, apocrine metaplasia, dilated mammary ducts.      Negative for malignancy.       INTERVAL HISTORY:  Victoria [name] is here for a follow up of Left Breast Cancer, ER positive. She was last seen by me on 10/12/2022. She presents to the clinic alone. Pt is currently receiving radiation and she state that she has some fatigue and little skin irritation. Pt reports she do has joint pain.    All other systems were reviewed with the patient and are negative.  MEDICAL HISTORY:  Past Medical History:  Diagnosis Date   Allergic bronchopulmonary aspergillosis (HCC) 2008   sees Dr Wenda Overland pulmonology   Anemia    iron deficiency, resolved   Anxiety    Asthma    Breast cancer (HCC)    CAD (coronary artery disease)    a. LHC 6/16:  oOM1 60, pRCA 25 >> med Rx b. cath 3/19 2nd OM with 95% stenosis s/p synergy DES & anomalous RCA   CAP  (community acquired pneumonia) 2016; 06/07/2016   Chronic bronchitis (HCC)    Chronic lower back pain    Complication of anesthesia    "think I have a hard time waking up from it"   Depression    mild   Diverticulitis    Diverticulosis    GERD (gastroesophageal reflux disease)    H/O hiatal hernia    Headache    "weekly" (08/23/2017)   History of echocardiogram    Echo 6/16:  Mod LVH, EF 60-65%, no RWMA, Gr 1 DD, trivial MR, normal LA size.   Hyperglycemia 11/20/2015   Hyperlipidemia, mixed 09/11/2007   Qualifier: Diagnosis of  By: Janit Bern   Did not tolerate Lipitor, zocor, Lovastatin, Pravastatin, Livalo, Crestor even low dose    IBS (irritable bowel syndrome)    Maxillary sinusitis    Normal cardiac stress test 11/2011   No evidence of ischemia or infarct.   Calculated ejection fraction 72%.   Obesity    OSA (obstructive sleep apnea) 02/2012   has stopped using  cpap   Osteoarthritis    Osteoporosis    Pneumonia 11/2011   "before 2013 I hadn't had pneumonia since I was a child" (04/13/2012)   Pulmonary  nodules    S/P angioplasty with stent 08/23/17 ostial 2nd OM with DES synnergy 08/24/2017   Schatzki's ring     SURGICAL HISTORY: Past Surgical History:  Procedure Laterality Date   ANTERIOR AND POSTERIOR REPAIR N/A 10/18/2022   Procedure: ANTERIOR (CYSTOCELE);  Surgeon: Marcelle Overlie, MD;  Location: [facility] OR;  Service: Gynecology;  Laterality: N/A;   APPENDECTOMY  1989   BREAST BIOPSY  11/03/2022   MM LT RADIOACTIVE SEED LOC MAMMO GUIDE 11/03/2022 [facility] MAMMOGRAPHY   BREAST LUMPECTOMY WITH RADIOACTIVE SEED LOCALIZATION Left 11/04/2022   Procedure: LEFT BREAST LUMPECTOMY WITH RADIOACTIVE SEED LOCALIZATION;  Surgeon: Griselda Miner, MD;  Location: [facility];  Service: General;  Laterality: Left;   CARDIAC CATHETERIZATION N/A 11/25/2014   Procedure: Right/Left Heart Cath and Coronary Angiography;  Surgeon: Lyn Records, MD;  Location: [facility] INVASIVE CV LAB;   Service: Cardiovascular;  Laterality: N/A;   CESAREAN SECTION  1985   COLONOSCOPY  03/2022   CORONARY ANGIOPLASTY WITH STENT PLACEMENT  08/23/2017   CORONARY STENT INTERVENTION N/A 08/23/2017   Procedure: CORONARY STENT INTERVENTION;  Surgeon: Kathleene Hazel, MD;  Location: [facility] INVASIVE CV LAB;  Service: Cardiovascular;  Laterality: N/A;   HERNIA REPAIR  04/13/2012   VHR laparoscopic   LAPAROSCOPIC BILATERAL SALPINGO OOPHERECTOMY Bilateral 10/18/2022   Procedure: LAPAROTOMY WITH BILATERAL SALPINGO OOPHORECTOMY;  Surgeon: Marcelle Overlie, MD;  Location: [facility] OR;  Service: Gynecology;  Laterality: Bilateral;   LEFT HEART CATH AND CORONARY ANGIOGRAPHY N/A 08/23/2017   Procedure: LEFT HEART CATH AND CORONARY ANGIOGRAPHY;  Surgeon: Kathleene Hazel, MD;  Location: [facility] INVASIVE CV LAB;  Service: Cardiovascular;  Laterality: N/A;   VAGINAL HYSTERECTOMY N/A 10/18/2022   Procedure: HYSTERECTOMY VAGINAL;  Surgeon: Marcelle Overlie, MD;  Location: [facility] OR;  Service: Gynecology;  Laterality: N/A;   VENTRAL HERNIA REPAIR  04/13/2012   Procedure: LAPAROSCOPIC VENTRAL HERNIA;  Surgeon: [name], MD;  Location: [facility] OR;  Service: General;  Laterality: N/A;  laparoscopic repair of incarcerated hernia    I have reviewed the social history and family history with the patient and they are unchanged from previous note.  ALLERGIES:  is allergic to latuda [lurasidone hcl], beclomethasone dipropionate, flexeril [cyclobenzaprine], mometasone furo-formoterol fum, sulfonamide derivatives, and statins.  MEDICATIONS:  Current Outpatient Medications  Medication Sig Dispense Refill   tamoxifen (NOLVADEX) 20 MG tablet Take 1 tablet (20 mg total) by mouth daily. 30 tablet 3   acetaminophen (TYLENOL) 650 MG CR tablet Take 650 mg by mouth every 8 (eight) hours as needed for pain.     albuterol (VENTOLIN HFA) 108 (90 Base) MCG/ACT inhaler Inhale 1-2 puffs into the lungs every 6 (six) hours as needed. 8 g 2    ascorbic acid (VITAMIN C) 500 MG tablet Take 500 mg by mouth daily.     aspirin EC 81 MG tablet Take 81 mg by mouth daily. Swallow whole.     Benralizumab (FASENRA PEN) 30 MG/ML SOAJ Inject 1 mL (30 mg total) into the skin every 8 (eight) weeks. 1 mL 2   budesonide (ENTOCORT EC) 3 MG 24 hr capsule Take 3 mg by mouth daily.     Calcium-Magnesium-Vitamin D (CALCIUM MAGNESIUM PO) Take 1 tablet by mouth at bedtime.     Cholecalciferol (VITAMIN D3 ULTRA STRENGTH) 125 MCG (5000 UT) capsule Take 5,000 Units by mouth daily.     clonazePAM (KLONOPIN) 0.5 MG tablet Take 1 tablet (0.5 mg total) by mouth daily. 30 tablet 0  denosumab (PROLIA) 60 MG/ML SOSY injection Inject 60 mg into the skin every 6 (six) months.     doxepin (SINEQUAN) 25 MG capsule Take 1 capsule (25 mg total) by mouth at bedtime. 30 capsule 3   doxycycline (VIBRA-TABS) 100 MG tablet Take 1 tablet (100 mg total) by mouth 2 (two) times daily. 10 tablet 0   Evolocumab (REPATHA SURECLICK) 140 MG/ML SOAJ Inject 140 mg into the skin every 21 ( twenty-one) days.     famotidine (PEPCID) 20 MG tablet TAKE 1 TABLET BY MOUTH TWICE A DAY 60 tablet 11   ibuprofen (ADVIL) 200 MG tablet Take 400 mg by mouth every 6 (six) hours as needed for moderate pain.     ipratropium-albuterol (DUONEB) 0.5-2.5 (3) MG/3ML SOLN Take 3 mLs by nebulization every 6 (six) hours as needed. 120 mL 5   Mometasone Furoate (ASMANEX HFA) 100 MCG/ACT AERO Inhale 2 puffs into the lungs in the morning and at bedtime. 13 g 4   nitroGLYCERIN (NITROSTAT) 0.4 MG SL tablet Place 1 tablet (0.4 mg total) under the tongue every 5 (five) minutes as needed for chest pain. 25 tablet 11   oxyCODONE (ROXICODONE) 5 MG immediate release tablet Take 1 tablet (5 mg total) by mouth every 6 (six) hours as needed for severe pain. 10 tablet 0   pindolol (VISKEN) 5 MG tablet Take 1 tablet (5 mg total) by mouth daily after breakfast. 30 tablet 3   risperiDONE (RISPERDAL) 0.5 MG tablet Take 1 tablet (0.5  mg total) by mouth 2 (two) times daily at 8 am and 4 pm. 60 tablet 3   venlafaxine XR (EFFEXOR XR) 75 MG 24 hr capsule Take 3 capsules (225 mg total) by mouth daily with breakfast. 90 capsule 1   No current facility-administered medications for this visit.    PHYSICAL EXAMINATION: ECOG PERFORMANCE STATUS: 0 - Asymptomatic  Vitals:   01/12/23 1304  BP: (!) 102/52  Pulse: 74  Resp: 16  Temp: 98.1 F (36.7 C)  SpO2: 100%   Wt Readings from Last 3 Encounters:  01/12/23 164 lb 14.4 oz (74.8 kg)  12/23/22 162 lb 12.8 oz (73.8 kg)  12/05/22 163 lb (73.9 kg)     GENERAL:alert, no distress and comfortable SKIN: skin color normal, no rashes or significant lesions EYES: normal, Conjunctiva are pink and non-injected, sclera clear  NEURO: alert & oriented x 3 with fluent speech  BREAST: LT breast lumpectomy  clean incision. No skin irritation at the site. LABORATORY DATA:  I have reviewed the data as listed    Latest Ref Rng & Units 01/12/2023   12:27 PM 10/19/2022    5:27 AM 10/18/2022    6:52 AM  CBC  WBC 4.0 - 10.5 K/uL 5.8  7.5  4.4   Hemoglobin 12.0 - 15.0 g/dL 37.1  69.6  78.9   Hematocrit 36.0 - 46.0 % 35.5  31.1  34.8   Platelets 150 - 400 K/uL 239  223  236         Latest Ref Rng & Units 01/12/2023   12:27 PM 10/12/2022   12:13 PM 08/12/2022    3:00 PM  CMP  Glucose 70 - 99 mg/dL 85  381  017   BUN 8 - 23 mg/dL 15  14  12    Creatinine 0.44 - 1.00 mg/dL 5.10  2.58  5.27   Sodium 135 - 145 mmol/L 140  136  130   Potassium 3.5 - 5.1 mmol/L 3.8  4.5  3.4   Chloride 98 - 111 mmol/L 103  101  96   CO2 22 - 32 mmol/L 31  31  24    Calcium 8.9 - 10.3 mg/dL 9.3  9.5  9.1   Total Protein 6.5 - 8.1 g/dL 6.1  6.4  6.7   Total Bilirubin 0.3 - 1.2 mg/dL 0.3  0.3  0.5   Alkaline Phos 38 - 126 U/L 78  63  72   AST 15 - 41 U/L 15  15  20    ALT 0 - 44 U/L 15  16  17        RADIOGRAPHIC STUDIES: I have personally reviewed the radiological images as listed and agreed with the  findings in the report. No results found.    No orders of the defined types were placed in this encounter.  All questions were answered. The patient knows to call the clinic with any problems, questions or concerns. No barriers to learning was detected. The total time spent in the appointment was 30 minutes.     Malachy Mood, MD 01/12/2023   Carolin Coy, CMA, am acting as scribe for Malachy Mood, MD.   I have reviewed the above documentation for accuracy and completeness, and I agree with the above.

## 2023-01-12 NOTE — Assessment & Plan Note (Signed)
-  She is on Prolia injection every 6 months, and vitamin D. -We discussed the impact of antiestrogen therapy on her bone density, she will repeat a bone density every 2 years

## 2023-01-12 NOTE — Assessment & Plan Note (Addendum)
invasive ductal carcinoma, stage Ia, pT1aN0M0, ER+/PR+/HER2-, G1 --We discussed her imaging findings and the biopsy results in great details. -Giving the very early disease, she likely a candidate for lumpectomy. She is agreeable with that. She was seen by Dr. Carolynne Edouard today and likely will proceed with surgery soon.  Given her age and small primary tumor, it is reasonable to forego sentinel lymph node biopsy. -Given the small tumor, no Oncotype or adjuvant chemotherapy needed.  Given the favorable prognostic factors, this is likely low risk disease. -she underwent left breast lumpectomy on Nov 04, 2022, I reviewed her surgical path. -She started adjuvant radiation on December 26, 2022, she is tolerating well -Plan to start her on adjuvant tamoxifen next months ---The potential side effects, which includes but not limited to, hot flash, skin and vaginal dryness, slightly increased risk of cardiovascular disease and cataract, small risk of thrombosis and endometrial cancer, were discussed with her in great details. Preventive strategies for thrombosis, such as being physically active, using compression stocks, avoid cigarette smoking, etc., were reviewed with her. I also recommend her to follow-up with her gynecologist once a year, and watch for vaginal spotting or bleeding, as a clinically sign of endometrial cancer, etc. She voiced good understanding, and agrees to proceed. Will start after she completes adjuvant breast radiation.  She agrees to proceed.  I called in today. -Survivorship in 3 months and I will see her back in 6 months.

## 2023-01-13 ENCOUNTER — Ambulatory Visit
Admission: RE | Admit: 2023-01-13 | Discharge: 2023-01-13 | Disposition: A | Discharge status: Home / Self Care | Payer: PPO | Source: Ambulatory Visit | Attending: Radiation Oncology | Admitting: Radiation Oncology

## 2023-01-13 ENCOUNTER — Other Ambulatory Visit: Payer: Self-pay

## 2023-01-13 DIAGNOSIS — Z170 Estrogen receptor positive status [ER+]: Secondary | ICD-10-CM | POA: Diagnosis not present

## 2023-01-13 DIAGNOSIS — C50512 Malignant neoplasm of lower-outer quadrant of left female breast: Secondary | ICD-10-CM | POA: Diagnosis not present

## 2023-01-13 DIAGNOSIS — Z510 Encounter for antineoplastic radiation therapy: Secondary | ICD-10-CM | POA: Diagnosis not present

## 2023-01-13 LAB — RAD ONC ARIA SESSION SUMMARY
Course Elapsed Days: 18
Plan Fractions Treated to Date: 14
Plan Prescribed Dose Per Fraction: 2.67 Gy
Plan Total Fractions Prescribed: 15
Plan Total Prescribed Dose: 40.05 Gy
Reference Point Dosage Given to Date: 37.38 Gy
Reference Point Session Dosage Given: 2.67 Gy
Session Number: 14

## 2023-01-16 ENCOUNTER — Other Ambulatory Visit: Payer: Self-pay

## 2023-01-16 ENCOUNTER — Ambulatory Visit: Payer: PPO

## 2023-01-16 DIAGNOSIS — Z170 Estrogen receptor positive status [ER+]: Secondary | ICD-10-CM | POA: Diagnosis not present

## 2023-01-16 DIAGNOSIS — C50512 Malignant neoplasm of lower-outer quadrant of left female breast: Secondary | ICD-10-CM | POA: Diagnosis not present

## 2023-01-16 DIAGNOSIS — Z510 Encounter for antineoplastic radiation therapy: Secondary | ICD-10-CM | POA: Diagnosis not present

## 2023-01-16 LAB — RAD ONC ARIA SESSION SUMMARY
Course Elapsed Days: 21
Plan Fractions Treated to Date: 15
Plan Prescribed Dose Per Fraction: 2.67 Gy
Plan Total Fractions Prescribed: 15
Plan Total Prescribed Dose: 40.05 Gy
Reference Point Dosage Given to Date: 40.05 Gy
Reference Point Session Dosage Given: 2.67 Gy
Session Number: 15

## 2023-01-17 ENCOUNTER — Ambulatory Visit
Admission: RE | Admit: 2023-01-17 | Discharge: 2023-01-17 | Disposition: A | Discharge status: Home / Self Care | Payer: PPO | Source: Ambulatory Visit | Attending: Radiation Oncology | Admitting: Radiation Oncology

## 2023-01-17 ENCOUNTER — Other Ambulatory Visit: Payer: Self-pay

## 2023-01-17 DIAGNOSIS — Z510 Encounter for antineoplastic radiation therapy: Secondary | ICD-10-CM | POA: Diagnosis not present

## 2023-01-17 DIAGNOSIS — C50512 Malignant neoplasm of lower-outer quadrant of left female breast: Secondary | ICD-10-CM | POA: Diagnosis not present

## 2023-01-17 DIAGNOSIS — Z170 Estrogen receptor positive status [ER+]: Secondary | ICD-10-CM | POA: Diagnosis not present

## 2023-01-17 LAB — RAD ONC ARIA SESSION SUMMARY
Course Elapsed Days: 22
Plan Fractions Treated to Date: 1
Plan Prescribed Dose Per Fraction: 2 Gy
Plan Total Fractions Prescribed: 5
Plan Total Prescribed Dose: 10 Gy
Reference Point Dosage Given to Date: 2 Gy
Reference Point Session Dosage Given: 2 Gy
Session Number: 16

## 2023-01-18 ENCOUNTER — Other Ambulatory Visit: Payer: Self-pay

## 2023-01-18 ENCOUNTER — Encounter: Payer: Self-pay | Admitting: Hematology

## 2023-01-18 ENCOUNTER — Ambulatory Visit
Admission: RE | Admit: 2023-01-18 | Discharge: 2023-01-18 | Disposition: A | Discharge status: Home / Self Care | Payer: PPO | Source: Ambulatory Visit | Attending: Radiation Oncology | Admitting: Radiation Oncology

## 2023-01-18 DIAGNOSIS — C50512 Malignant neoplasm of lower-outer quadrant of left female breast: Secondary | ICD-10-CM | POA: Diagnosis not present

## 2023-01-18 LAB — RAD ONC ARIA SESSION SUMMARY
Course Elapsed Days: 23
Plan Fractions Treated to Date: 2
Plan Prescribed Dose Per Fraction: 2 Gy
Plan Total Fractions Prescribed: 5
Plan Total Prescribed Dose: 10 Gy
Reference Point Dosage Given to Date: 4 Gy
Reference Point Session Dosage Given: 2 Gy
Session Number: 17

## 2023-01-18 NOTE — Assessment & Plan Note (Signed)
Tolerating treatment

## 2023-01-18 NOTE — Assessment & Plan Note (Signed)
Hydrate and monitor 

## 2023-01-18 NOTE — Assessment & Plan Note (Signed)
Overall stable on current meds and managing her new diagnosis well.

## 2023-01-18 NOTE — Assessment & Plan Note (Signed)
Unable to tolerate statins 

## 2023-01-18 NOTE — Progress Notes (Signed)
Subjective:    Patient ID: Victoria [name], female    DOB: Aug 09, 1955, 67 y.o.   MRN: [id number]  No chief complaint on file.   HPI Discussed the use of AI scribe software for clinical note transcription with the patient, who gave verbal consent to proceed.  History of Present Illness   Patient is a 67 yo female in today for follow up on chronic medical concerns. No recent febrile illness or hospitalizations. Denies CP/palp/SOB/HA/congestion/fevers/GI or GU c/o. Taking meds as prescribed           Past Medical History:  Diagnosis Date  . Allergic bronchopulmonary aspergillosis (HCC) 2008   sees Dr Wenda Overland pulmonology  . Anemia    iron deficiency, resolved  . Anxiety   . Asthma   . Breast cancer (HCC)   . CAD (coronary artery disease)    a. LHC 6/16:  oOM1 60, pRCA 25 >> med Rx b. cath 3/19 2nd OM with 95% stenosis s/p synergy DES & anomalous RCA  . CAP (community acquired pneumonia) 2016; 06/07/2016  . Chronic bronchitis (HCC)   . Chronic lower back pain   . Complication of anesthesia    "think I have a hard time waking up from it"  . Depression    mild  . Diverticulitis   . Diverticulosis   . GERD (gastroesophageal reflux disease)   . H/O hiatal hernia   . Headache    "weekly" (08/23/2017)  . History of echocardiogram    Echo 6/16:  Mod LVH, EF 60-65%, no RWMA, Gr 1 DD, trivial MR, normal LA size.  Marland Kitchen Hyperglycemia 11/20/2015  . Hyperlipidemia, mixed 09/11/2007   Qualifier: Diagnosis of  By: Janit Bern   Did not tolerate Lipitor, zocor, Lovastatin, Pravastatin, Livalo, Crestor even low dose   . IBS (irritable bowel syndrome)   . Maxillary sinusitis   . Normal cardiac stress test 11/2011   No evidence of ischemia or infarct.   Calculated ejection fraction 72%.  . Obesity   . OSA (obstructive sleep apnea) 02/2012   has stopped using  cpap  . Osteoarthritis   . Osteoporosis   . Pneumonia 11/2011   "before 2013 I hadn't had pneumonia since I was a  child" (04/13/2012)  . Pulmonary nodules   . S/P angioplasty with stent 08/23/17 ostial 2nd OM with DES synnergy 08/24/2017  . Schatzki's ring     Past Surgical History:  Procedure Laterality Date  . ANTERIOR AND POSTERIOR REPAIR N/A 10/18/2022   Procedure: ANTERIOR (CYSTOCELE);  Surgeon: Marcelle Overlie, MD;  Location: [facility] OR;  Service: Gynecology;  Laterality: N/A;  . APPENDECTOMY  1989  . BREAST BIOPSY  11/03/2022   MM LT RADIOACTIVE SEED LOC MAMMO GUIDE 11/03/2022 [facility] MAMMOGRAPHY  . BREAST LUMPECTOMY WITH RADIOACTIVE SEED LOCALIZATION Left 11/04/2022   Procedure: LEFT BREAST LUMPECTOMY WITH RADIOACTIVE SEED LOCALIZATION;  Surgeon: Griselda Miner, MD;  Location: [facility];  Service: General;  Laterality: Left;  . CARDIAC CATHETERIZATION N/A 11/25/2014   Procedure: Right/Left Heart Cath and Coronary Angiography;  Surgeon: Lyn Records, MD;  Location: [facility] INVASIVE CV LAB;  Service: Cardiovascular;  Laterality: N/A;  . CESAREAN SECTION  1985  . COLONOSCOPY  03/2022  . CORONARY ANGIOPLASTY WITH STENT PLACEMENT  08/23/2017  . CORONARY STENT INTERVENTION N/A 08/23/2017   Procedure: CORONARY STENT INTERVENTION;  Surgeon: Kathleene Hazel, MD;  Location: [facility] INVASIVE CV LAB;  Service: Cardiovascular;  Laterality: N/A;  . HERNIA REPAIR  04/13/2012  VHR laparoscopic  . LAPAROSCOPIC BILATERAL SALPINGO OOPHERECTOMY Bilateral 10/18/2022   Procedure: LAPAROTOMY WITH BILATERAL SALPINGO OOPHORECTOMY;  Surgeon: Marcelle Overlie, MD;  Location: [facility] OR;  Service: Gynecology;  Laterality: Bilateral;  . LEFT HEART CATH AND CORONARY ANGIOGRAPHY N/A 08/23/2017   Procedure: LEFT HEART CATH AND CORONARY ANGIOGRAPHY;  Surgeon: Kathleene Hazel, MD;  Location: [facility] INVASIVE CV LAB;  Service: Cardiovascular;  Laterality: N/A;  . VAGINAL HYSTERECTOMY N/A 10/18/2022   Procedure: HYSTERECTOMY VAGINAL;  Surgeon: Marcelle Overlie, MD;  Location: [facility] OR;  Service: Gynecology;  Laterality: N/A;  .  VENTRAL HERNIA REPAIR  04/13/2012   Procedure: LAPAROSCOPIC VENTRAL HERNIA;  Surgeon: [name], MD;  Location: [facility] OR;  Service: General;  Laterality: N/A;  laparoscopic repair of incarcerated hernia    Family History  Problem Relation Age of Onset  . Breast cancer Mother 84       bilateral breast cancer dx. 95, met to liver  . Hypertension Mother   . Diabetes Mother   . Diverticulosis Father   . Prostate cancer Father   . Breast cancer Sister 66       DCIS at 40, IDC at 62, PALB2+  . Cancer Sister        breast cancer, invasive ductal carcinoma in 2022,DCIS at 72 with 4 weeks of radiation, 5 years of Tamoxifen   . Pulmonary embolism Brother        recurrent  . Heart attack Maternal Grandfather   . Ovarian cancer Paternal Grandmother   . Cerebral palsy Son   . Prostate cancer Paternal Uncle   . Breast cancer Niece 64       PALB2+  . Osteoporosis Niece   . Stroke Neg Hx   . Colon cancer Neg Hx   . Esophageal cancer Neg Hx   . Stomach cancer Neg Hx   . Rectal cancer Neg Hx     Social History   Socioeconomic History  . Marital status: Married    Spouse name: Not on file  . Number of children: 1  . Years of education: 14  . Highest education level: Associate degree: academic program  Occupational History  . Occupation: Disabled   Tobacco Use  . Smoking status: Never    Passive exposure: Never  . Smokeless tobacco: Never  Vaping Use  . Vaping status: Never Used  Substance and Sexual Activity  . Alcohol use: Yes    Alcohol/week: 4.0 - 7.0 standard drinks of alcohol    Types: 4 - 7 Standard drinks or equivalent per week  . Drug use: No  . Sexual activity: Yes    Birth control/protection: Surgical    Comment: gluten free, lives with husband and son with CP quadriplegia  Other Topics Concern  . 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    Lives at home with her husband and son.   Right-handed.   2 cups caffeine per day.    One story home   Social Determinants of Health   Financial Resource Strain: Low Risk  (01/18/2023)   Overall Financial Resource Strain (CARDIA)   . Difficulty of Paying Living Expenses: Not hard at all  Food Insecurity: No Food Insecurity (01/18/2023)   Hunger Vital Sign   . Worried About Programme researcher, broadcasting/film/video in the Last Year: Never true   . Ran Out of Food in the Last Year: Never true  Transportation Needs: No Transportation Needs (10/24/2022)   PRAPARE -  Transportation   . Lack of Transportation (Medical): No   . Lack of Transportation (Non-Medical): No  Physical Activity: Insufficiently Active (10/19/2021)   Exercise Vital Sign   . Days of Exercise per Week: 7 days   . Minutes of Exercise per Session: 20 min  Stress: Stress Concern Present (01/18/2023)   [employer] of Occupational Health - Occupational Stress Questionnaire   . Feeling of Stress : To some extent  Social Connections: Unknown (01/18/2023)   Social Connection and Isolation Panel [NHANES]   . Frequency of Communication with Friends and Family: Three times a week   . Frequency of Social Gatherings with Friends and Family: Once a week   . Attends Religious Services: More than 4 times per year   . Active Member of Clubs or Organizations: Not on file   . Attends Banker Meetings: Not on file   . Marital Status: Married  Catering manager Violence: Not At Risk (10/19/2022)   Humiliation, Afraid, Rape, and Kick questionnaire   . Fear of Current or Ex-Partner: No   . Emotionally Abused: No   . Physically Abused: No   . Sexually Abused: No    Outpatient Medications Prior to Visit  Medication Sig Dispense Refill  . acetaminophen (TYLENOL) 650 MG CR tablet Take 650 mg by mouth every 8 (eight) hours as needed for pain.    Marland Kitchen albuterol (VENTOLIN HFA) 108 (90 Base) MCG/ACT inhaler Inhale 1-2 puffs into the lungs every 6 (six) hours as needed. 8 g 2  . ascorbic acid (VITAMIN C) 500 MG tablet Take 500 mg by mouth  daily.    Marland Kitchen aspirin EC 81 MG tablet Take 81 mg by mouth daily. Swallow whole.    Marland Kitchen Benralizumab (FASENRA PEN) 30 MG/ML SOAJ Inject 1 mL (30 mg total) into the skin every 8 (eight) weeks. 1 mL 2  . budesonide (ENTOCORT EC) 3 MG 24 hr capsule Take 3 mg by mouth daily.    . Calcium-Magnesium-Vitamin D (CALCIUM MAGNESIUM PO) Take 1 tablet by mouth at bedtime.    . Cholecalciferol (VITAMIN D3 ULTRA STRENGTH) 125 MCG (5000 UT) capsule Take 5,000 Units by mouth daily.    . clonazePAM (KLONOPIN) 0.5 MG tablet Take 1 tablet (0.5 mg total) by mouth daily. 30 tablet 0  . denosumab (PROLIA) 60 MG/ML SOSY injection Inject 60 mg into the skin every 6 (six) months.    . doxepin (SINEQUAN) 25 MG capsule Take 1 capsule (25 mg total) by mouth at bedtime. 30 capsule 3  . doxycycline (VIBRA-TABS) 100 MG tablet Take 1 tablet (100 mg total) by mouth 2 (two) times daily. 10 tablet 0  . Evolocumab (REPATHA SURECLICK) 140 MG/ML SOAJ Inject 140 mg into the skin every 21 ( twenty-one) days.    . famotidine (PEPCID) 20 MG tablet TAKE 1 TABLET BY MOUTH TWICE A DAY 60 tablet 11  . ibuprofen (ADVIL) 200 MG tablet Take 400 mg by mouth every 6 (six) hours as needed for moderate pain.    Marland Kitchen ipratropium-albuterol (DUONEB) 0.5-2.5 (3) MG/3ML SOLN Take 3 mLs by nebulization every 6 (six) hours as needed. 120 mL 5  . Mometasone Furoate (ASMANEX HFA) 100 MCG/ACT AERO Inhale 2 puffs into the lungs in the morning and at bedtime. 13 g 4  . nitroGLYCERIN (NITROSTAT) 0.4 MG SL tablet Place 1 tablet (0.4 mg total) under the tongue every 5 (five) minutes as needed for chest pain. 25 tablet 11  . oxyCODONE (ROXICODONE) 5 MG immediate release  tablet Take 1 tablet (5 mg total) by mouth every 6 (six) hours as needed for severe pain. 10 tablet 0  . pindolol (VISKEN) 5 MG tablet Take 1 tablet (5 mg total) by mouth daily after breakfast. 30 tablet 3  . risperiDONE (RISPERDAL) 0.5 MG tablet Take 1 tablet (0.5 mg total) by mouth 2 (two) times daily at 8  am and 4 pm. 60 tablet 3  . tamoxifen (NOLVADEX) 20 MG tablet Take 1 tablet (20 mg total) by mouth daily. 30 tablet 3  . venlafaxine XR (EFFEXOR XR) 75 MG 24 hr capsule Take 3 capsules (225 mg total) by mouth daily with breakfast. 90 capsule 1   No facility-administered medications prior to visit.    Allergies  Allergen Reactions  . Latuda [Lurasidone Hcl] Other (See Comments)    PER THE PT CAUSED RESTLESSNESS  . Beclomethasone Dipropionate Hives and Other (See Comments)     weight gain  . Flexeril [Cyclobenzaprine] Anxiety  . Mometasone Furo-Formoterol Fum Hives and Other (See Comments)    weight gain  . Sulfonamide Derivatives Hives and Rash  . Statins Other (See Comments)    Myalgias, RLS    Review of Systems  Constitutional:  Negative for fever and malaise/fatigue.  HENT:  Negative for congestion.   Eyes:  Negative for blurred vision.  Respiratory:  Negative for shortness of breath.   Cardiovascular:  Negative for chest pain, palpitations and leg swelling.  Gastrointestinal:  Negative for abdominal pain, blood in stool and nausea.  Genitourinary:  Negative for dysuria and frequency.  Musculoskeletal:  Negative for falls.  Skin:  Negative for rash.  Neurological:  Negative for dizziness, loss of consciousness and headaches.  Endo/Heme/Allergies:  Negative for environmental allergies.  Psychiatric/Behavioral:  Negative for depression. The patient is not nervous/anxious.       Objective:    Physical Exam Constitutional:      General: She is not in acute distress.    Appearance: Normal appearance. She is not ill-appearing or toxic-appearing.  HENT:     Head: Normocephalic and atraumatic.     Right Ear: External ear normal.     Left Ear: External ear normal.     Nose: Nose normal.  Eyes:     General:        Right eye: No discharge.        Left eye: No discharge.  Pulmonary:     Effort: Pulmonary effort is normal.  Skin:    Findings: No rash.  Neurological:      Mental Status: She is alert and oriented to person, place, and time.  Psychiatric:        Behavior: Behavior normal.   There were no vitals taken for this visit. Wt Readings from Last 3 Encounters:  01/12/23 164 lb 14.4 oz (74.8 kg)  12/23/22 162 lb 12.8 oz (73.8 kg)  12/05/22 163 lb (73.9 kg)    Diabetic Foot Exam - Simple   No data filed    Lab Results  Component Value Date   WBC 5.8 01/12/2023   HGB 11.7 (L) 01/12/2023   HCT 35.5 (L) 01/12/2023   PLT 239 01/12/2023   GLUCOSE 85 01/12/2023   CHOL 208 (H) 05/26/2022   TRIG 113.0 05/26/2022   HDL 74.00 05/26/2022   LDLDIRECT 177.8 09/07/2011   LDLCALC 111 (H) 05/26/2022   ALT 15 01/12/2023   AST 15 01/12/2023   NA 140 01/12/2023   K 3.8 01/12/2023   CL 103 01/12/2023  CREATININE 0.88 01/12/2023   BUN 15 01/12/2023   CO2 31 01/12/2023   TSH 1.788 08/12/2022   INR 1.0 08/21/2017   HGBA1C 5.6 05/26/2022    Lab Results  Component Value Date   TSH 1.788 08/12/2022   Lab Results  Component Value Date   WBC 5.8 01/12/2023   HGB 11.7 (L) 01/12/2023   HCT 35.5 (L) 01/12/2023   MCV 90.3 01/12/2023   PLT 239 01/12/2023   Lab Results  Component Value Date   NA 140 01/12/2023   K 3.8 01/12/2023   CO2 31 01/12/2023   GLUCOSE 85 01/12/2023   BUN 15 01/12/2023   CREATININE 0.88 01/12/2023   BILITOT 0.3 01/12/2023   ALKPHOS 78 01/12/2023   AST 15 01/12/2023   ALT 15 01/12/2023   PROT 6.1 (L) 01/12/2023   ALBUMIN 3.9 01/12/2023   CALCIUM 9.3 01/12/2023   ANIONGAP 6 01/12/2023   EGFR 101 09/23/2021   GFR 93.33 05/26/2022   Lab Results  Component Value Date   CHOL 208 (H) 05/26/2022   Lab Results  Component Value Date   HDL 74.00 05/26/2022   Lab Results  Component Value Date   LDLCALC 111 (H) 05/26/2022   Lab Results  Component Value Date   TRIG 113.0 05/26/2022   Lab Results  Component Value Date   CHOLHDL 3 05/26/2022   Lab Results  Component Value Date   HGBA1C 5.6 05/26/2022        Assessment & Plan:  Malignant neoplasm of left female breast, unspecified estrogen receptor status, unspecified site of breast (HCC) Assessment & Plan: Tolerating treatment   Chronic diastolic CHF (congestive heart failure), NYHA class 2 (HCC) Assessment & Plan: No recent axacerbation or concerns   Hyperglycemia Assessment & Plan: hgba1c acceptable, minimize simple carbs. Increase exercise as tolerated. Consider Prevnar 20, Arexvy and covid booster   Hyperlipidemia, mixed Assessment & Plan: Encourage heart healthy diet such as MIND or DASH diet, increase exercise, avoid trans fats, simple carbohydrates and processed foods, consider a krill or fish or flaxseed oil cap daily.    Insomnia, unspecified type Assessment & Plan: Encouraged good sleep hygiene such as dark, quiet room. No blue/green glowing lights such as computer screens in bedroom. No alcohol or stimulants in evening. Cut down on caffeine as able. Regular exercise is helpful but not just prior to bedtime   Muscle spasm Assessment & Plan: Hydrate and monitor    Statin myopathy Assessment & Plan: Unable to tolerate statins   Anxiety and depression Assessment & Plan: Overall stable on current meds and managing her new diagnosis well.      Assessment and Plan              Danise Edge, MD

## 2023-01-18 NOTE — Assessment & Plan Note (Signed)
hgba1c acceptable, minimize simple carbs. Increase exercise as tolerated. Consider Prevnar 20, Arexvy and covid booster

## 2023-01-18 NOTE — Assessment & Plan Note (Signed)
Encourage heart healthy diet such as MIND or DASH diet, increase exercise, avoid trans fats, simple carbohydrates and processed foods, consider a krill or fish or flaxseed oil cap daily.  °

## 2023-01-18 NOTE — Assessment & Plan Note (Signed)
No recent axacerbation or concerns

## 2023-01-18 NOTE — Assessment & Plan Note (Signed)
Encouraged good sleep hygiene such as dark, quiet room. No blue/green glowing lights such as computer screens in bedroom. No alcohol or stimulants in evening. Cut down on caffeine as able. Regular exercise is helpful but not just prior to bed time.  

## 2023-01-19 ENCOUNTER — Ambulatory Visit
Admission: RE | Admit: 2023-01-19 | Discharge: 2023-01-19 | Disposition: A | Discharge status: Home / Self Care | Payer: PPO | Source: Ambulatory Visit | Attending: Radiation Oncology | Admitting: Radiation Oncology

## 2023-01-19 ENCOUNTER — Ambulatory Visit (INDEPENDENT_AMBULATORY_CARE_PROVIDER_SITE_OTHER): Payer: PPO | Admitting: Family Medicine

## 2023-01-19 ENCOUNTER — Encounter: Payer: Self-pay | Admitting: Family Medicine

## 2023-01-19 ENCOUNTER — Other Ambulatory Visit: Payer: Self-pay

## 2023-01-19 VITALS — BP 105/64 | HR 76 | Temp 98.0°F | Resp 16 | Ht 63.0 in | Wt 164.8 lb

## 2023-01-19 DIAGNOSIS — G720 Drug-induced myopathy: Secondary | ICD-10-CM

## 2023-01-19 DIAGNOSIS — C50512 Malignant neoplasm of lower-outer quadrant of left female breast: Secondary | ICD-10-CM | POA: Diagnosis not present

## 2023-01-19 DIAGNOSIS — M25561 Pain in right knee: Secondary | ICD-10-CM

## 2023-01-19 DIAGNOSIS — M62838 Other muscle spasm: Secondary | ICD-10-CM

## 2023-01-19 DIAGNOSIS — M419 Scoliosis, unspecified: Secondary | ICD-10-CM

## 2023-01-19 DIAGNOSIS — I5032 Chronic diastolic (congestive) heart failure: Secondary | ICD-10-CM

## 2023-01-19 DIAGNOSIS — D509 Iron deficiency anemia, unspecified: Secondary | ICD-10-CM

## 2023-01-19 DIAGNOSIS — R0600 Dyspnea, unspecified: Secondary | ICD-10-CM | POA: Diagnosis not present

## 2023-01-19 DIAGNOSIS — F419 Anxiety disorder, unspecified: Secondary | ICD-10-CM | POA: Diagnosis not present

## 2023-01-19 DIAGNOSIS — R739 Hyperglycemia, unspecified: Secondary | ICD-10-CM

## 2023-01-19 DIAGNOSIS — E782 Mixed hyperlipidemia: Secondary | ICD-10-CM | POA: Diagnosis not present

## 2023-01-19 DIAGNOSIS — C50912 Malignant neoplasm of unspecified site of left female breast: Secondary | ICD-10-CM | POA: Diagnosis not present

## 2023-01-19 DIAGNOSIS — G4700 Insomnia, unspecified: Secondary | ICD-10-CM

## 2023-01-19 DIAGNOSIS — M25562 Pain in left knee: Secondary | ICD-10-CM

## 2023-01-19 DIAGNOSIS — F32A Depression, unspecified: Secondary | ICD-10-CM

## 2023-01-19 LAB — RAD ONC ARIA SESSION SUMMARY
Course Elapsed Days: 24
Plan Fractions Treated to Date: 3
Plan Prescribed Dose Per Fraction: 2 Gy
Plan Total Fractions Prescribed: 5
Plan Total Prescribed Dose: 10 Gy
Reference Point Dosage Given to Date: 6 Gy
Reference Point Session Dosage Given: 2 Gy
Session Number: 18

## 2023-01-19 NOTE — Progress Notes (Signed)
BH MD/PA/NP OP Progress Note  01/20/2023 8:59 AM Victoria [name]  MRN:  [id number]  Visit Diagnosis:    ICD-10-CM   1. Anxiety and depression  F41.9 risperiDONE (RISPERDAL) 0.5 MG tablet   F32.A pindolol (VISKEN) 5 MG tablet    venlafaxine XR (EFFEXOR XR) 75 MG 24 hr capsule    clonazePAM (KLONOPIN) 0.5 MG tablet       Assessment: Victoria [name] is a 67 y.o. female with a history of anxiety, depression, OSA who presented to [facility] for initial evaluation on 06/30/2022.  At initial evaluation patient reported symptoms of anxiety including feeling nervous or on edge, being unable to stop controlled worrying, worrying too much about different things, and fear that something awful would happen.  Patient's symptoms have been gradually progressing.  There was a period in November 2023 where she was having passive SI without intent or plan.  Patient also did endorse low mood, poor sleep, difficulty concentrating.  She does have a history of obstructive sleep apnea and does not use CPAP with last sleep study being over 10 years ago. Psychosocially patient has increased stress of caring for her adult son with cerebral palsy and planning the next steps for him after she and her husband pass or are no longer able to care for him.  Patient met criteria for MDD and generalized anxiety disorder. Of note on exam patient was found to have smacking of her lips repetitive movements of her mouth which are concerning for parkinsonism secondary to Zyprexa.    [name] presents for follow-up evaluation. Today, 01/20/23, patient reports that mood and anxiety have been stable. She did opt to stay on Risperdal 0.5 mg for 1 year, though does endorse frustration about still experiencing weight gain. She also has had some increased restlessness in her legs during the night which has made it more difficult to fall asleep. For now she finds this manageable with behavioral adjustments.  Will continue on her current regimen and follow up in 2 months.   Plan: - Continue Effexor XR 225 mg QD - Continue Risperdal 0.5 mg daily - Continue Doxepin 25 mg QHS - Continue klonopin 0.5 mg QD prn for anxiety - Continue pindolol 5 mg QD - Recommend repeat sleep study, could consider dental devices OSA still present - CMP, CBC, lipid profile, Vit D, A1c, TSH reviewed - Continue with therapy every week through [place] therapy - Completed a 10 week wellness group through the [facility], followed by a group for special needs parents - Discussed other support groups - Crisis resources reviewed - Follow up in a month   Chief Complaint:  Chief Complaint  Patient presents with   Follow-up   HPI: Victoria [name] presents reporting she has been doing alright. Despite going through radiation the past month and [name] having a rough month with a couple illnesses. She only has 2 more radiation treatments to go and will then start Tamoxifen in a couple weeks. As for [name] she has been a bit more hopeful about his future as they made a connection with someone who has a potential handicap accessible home.  Jonte reported some disappointment about the increased risk or reoccurrence of her depressive symptoms in the first year. She decided to stay on Risperdal 0.5 mg for the time being due to concern about this. She has gained another 2 more pounds in the past month which has been frustrating as she is eating healthy and working hard to  manage her weight.   Patient notes that her only other issue at this time is some increased restlessness (legs) at night.  She does fall asleep after maybe 10-30 minutes but will toss and turn until then. She will get out of bed and into the recliner until she gets tired again which has helped. Discussed gabapentin for restless legs however patient did not feel it was necessary at this time.   Past Psychiatric History: One  prior psychiatric hospitalization to [facility] in February of 2024 due to  increased depression/anxiety. No prior suicide attempts.  Patient did endorse passive SI in November of 2023 without any intent or plan.  She denies being connected with a psychiatrist in the past and had recently connected with a therapist at [facility] therapy.  Has taken Effexor (possible restlessness at 225), Cymbalta, Remeron, Ativan, Xanax, Klonopin, gabapentin, BuSpar, Zyprexa (TD), Latuda (akathisia), Risperdal, Doxepin,   Denies substance use other than 1 beer a day. Can increase to 2 when the weather is nice.  Past Medical History:  Past Medical History:  Diagnosis Date   Allergic bronchopulmonary aspergillosis (HCC) 2008   sees Dr Wenda Overland pulmonology   Anemia    iron deficiency, resolved   Anxiety    Asthma    Breast cancer (HCC)    CAD (coronary artery disease)    a. LHC 6/16:  oOM1 60, pRCA 25 >> med Rx b. cath 3/19 2nd OM with 95% stenosis s/p synergy DES & anomalous RCA   CAP (community acquired pneumonia) 2016; 06/07/2016   Chronic bronchitis (HCC)    Chronic lower back pain    Complication of anesthesia    "think I have a hard time waking up from it"   Depression    mild   Diverticulitis    Diverticulosis    GERD (gastroesophageal reflux disease)    H/O hiatal hernia    Headache    "weekly" (08/23/2017)   History of echocardiogram    Echo 6/16:  Mod LVH, EF 60-65%, no RWMA, Gr 1 DD, trivial MR, normal LA size.   Hyperglycemia 11/20/2015   Hyperlipidemia, mixed 09/11/2007   Qualifier: Diagnosis of  By: Janit Bern   Did not tolerate Lipitor, zocor, Lovastatin, Pravastatin, Livalo, Crestor even low dose    IBS (irritable bowel syndrome)    Maxillary sinusitis    Normal cardiac stress test 11/2011   No evidence of ischemia or infarct.   Calculated ejection fraction 72%.   Obesity    OSA (obstructive sleep apnea) 02/2012   has stopped using  cpap   Osteoarthritis    Osteoporosis    Pneumonia 11/2011   "before 2013 I hadn't had pneumonia since I was  a child" (04/13/2012)   Pulmonary nodules    S/P angioplasty with stent 08/23/17 ostial 2nd OM with DES synnergy 08/24/2017   Schatzki's ring     Past Surgical History:  Procedure Laterality Date   ANTERIOR AND POSTERIOR REPAIR N/A 10/18/2022   Procedure: ANTERIOR (CYSTOCELE);  Surgeon: Marcelle Overlie, MD;  Location: [facility] OR;  Service: Gynecology;  Laterality: N/A;   APPENDECTOMY  1989   BREAST BIOPSY  11/03/2022   MM LT RADIOACTIVE SEED LOC MAMMO GUIDE 11/03/2022 [facility] MAMMOGRAPHY   BREAST LUMPECTOMY WITH RADIOACTIVE SEED LOCALIZATION Left 11/04/2022   Procedure: LEFT BREAST LUMPECTOMY WITH RADIOACTIVE SEED LOCALIZATION;  Surgeon: Griselda Miner, MD;  Location: [facility];  Service: General;  Laterality: Left;   CARDIAC CATHETERIZATION N/A 11/25/2014   Procedure:  Right/Left Heart Cath and Coronary Angiography;  Surgeon: Lyn Records, MD;  Location: [facility] INVASIVE CV LAB;  Service: Cardiovascular;  Laterality: N/A;   CESAREAN SECTION  1985   COLONOSCOPY  03/2022   CORONARY ANGIOPLASTY WITH STENT PLACEMENT  08/23/2017   CORONARY STENT INTERVENTION N/A 08/23/2017   Procedure: CORONARY STENT INTERVENTION;  Surgeon: Kathleene Hazel, MD;  Location: [facility] INVASIVE CV LAB;  Service: Cardiovascular;  Laterality: N/A;   HERNIA REPAIR  04/13/2012   VHR laparoscopic   LAPAROSCOPIC BILATERAL SALPINGO OOPHERECTOMY Bilateral 10/18/2022   Procedure: LAPAROTOMY WITH BILATERAL SALPINGO OOPHORECTOMY;  Surgeon: Marcelle Overlie, MD;  Location: [facility] OR;  Service: Gynecology;  Laterality: Bilateral;   LEFT HEART CATH AND CORONARY ANGIOGRAPHY N/A 08/23/2017   Procedure: LEFT HEART CATH AND CORONARY ANGIOGRAPHY;  Surgeon: Kathleene Hazel, MD;  Location: [facility] INVASIVE CV LAB;  Service: Cardiovascular;  Laterality: N/A;   VAGINAL HYSTERECTOMY N/A 10/18/2022   Procedure: HYSTERECTOMY VAGINAL;  Surgeon: Marcelle Overlie, MD;  Location: [facility] OR;  Service: Gynecology;  Laterality: N/A;   VENTRAL HERNIA  REPAIR  04/13/2012   Procedure: LAPAROSCOPIC VENTRAL HERNIA;  Surgeon: [name], MD;  Location: [facility] OR;  Service: General;  Laterality: N/A;  laparoscopic repair of incarcerated hernia    Family History:  Family History  Problem Relation Age of Onset   Breast cancer Mother 68       bilateral breast cancer dx. 34, met to liver   Hypertension Mother    Diabetes Mother    Diverticulosis Father    Prostate cancer Father    Breast cancer Sister 43       DCIS at 64, IDC at 70, PALB2+   Cancer Sister        breast cancer, invasive ductal carcinoma in 2022,DCIS at 3 with 4 weeks of radiation, 5 years of Tamoxifen    Pulmonary embolism Brother        recurrent   Heart attack Maternal Grandfather    Ovarian cancer Paternal Grandmother    Cerebral palsy Son    Prostate cancer Paternal Uncle    Breast cancer Niece 1       PALB2+   Osteoporosis Niece    Stroke Neg Hx    Colon cancer Neg Hx    Esophageal cancer Neg Hx    Stomach cancer Neg Hx    Rectal cancer Neg Hx     Social History:  Social History   Socioeconomic History   Marital status: Married    Spouse name: Not on file   Number of children: 1   Years of education: 14   Highest education level: Associate degree: academic program  Occupational History   Occupation: Disabled   Tobacco Use   Smoking status: Never    Passive exposure: Never   Smokeless tobacco: Never  Vaping Use   Vaping status: Never Used  Substance and Sexual Activity   Alcohol use: Yes    Alcohol/week: 4.0 - 7.0 standard drinks of alcohol    Types: 4 - 7 Standard drinks or equivalent per week   Drug use: No   Sexual activity: Yes    Birth control/protection: Surgical    Comment: gluten free, lives with husband and son with CP quadriplegia  Other Topics Concern   Not on file  Social History Narrative   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    Lives at home with her husband and son.   Right-handed.   2 cups caffeine per day.  One story  home   Social Determinants of Health   Financial Resource Strain: Low Risk  (01/18/2023)   Overall Financial Resource Strain (CARDIA)    Difficulty of Paying Living Expenses: Not hard at all  Food Insecurity: No Food Insecurity (01/18/2023)   Hunger Vital Sign    Worried About Running Out of Food in the Last Year: Never true    Ran Out of Food in the Last Year: Never true  Transportation Needs: No Transportation Needs (10/24/2022)   PRAPARE - Administrator, Civil Service (Medical): No    Lack of Transportation (Non-Medical): No  Physical Activity: Insufficiently Active (10/19/2021)   Exercise Vital Sign    Days of Exercise per Week: 7 days    Minutes of Exercise per Session: 20 min  Stress: Stress Concern Present (01/18/2023)   [employer] of Occupational Health - Occupational Stress Questionnaire    Feeling of Stress : To some extent  Social Connections: Unknown (01/18/2023)   Social Connection and Isolation Panel [NHANES]    Frequency of Communication with Friends and Family: Three times a week    Frequency of Social Gatherings with Friends and Family: Once a week    Attends Religious Services: More than 4 times per year    Active Member of [employer] or Organizations: Not on file    Attends Banker Meetings: Not on file    Marital Status: Married    Allergies:  Allergies  Allergen Reactions   Latuda [Lurasidone Hcl] Other (See Comments)    PER THE PT CAUSED RESTLESSNESS   Beclomethasone Dipropionate Hives and Other (See Comments)     weight gain   Flexeril [Cyclobenzaprine] Anxiety   Mometasone Furo-Formoterol Fum Hives and Other (See Comments)    weight gain   Sulfonamide Derivatives Hives and Rash   Statins Other (See Comments)    Myalgias, RLS    Current Medications: Current Outpatient Medications  Medication Sig Dispense Refill   acetaminophen (TYLENOL) 650 MG CR tablet Take 650 mg by mouth every 8 (eight) hours as needed for pain.      albuterol (VENTOLIN HFA) 108 (90 Base) MCG/ACT inhaler Inhale 1-2 puffs into the lungs every 6 (six) hours as needed. 8 g 2   ascorbic acid (VITAMIN C) 500 MG tablet Take 500 mg by mouth daily.     aspirin EC 81 MG tablet Take 81 mg by mouth daily. Swallow whole.     Benralizumab (FASENRA PEN) 30 MG/ML SOAJ Inject 1 mL (30 mg total) into the skin every 8 (eight) weeks. 1 mL 2   budesonide (ENTOCORT EC) 3 MG 24 hr capsule Take 3 mg by mouth daily.     Calcium-Magnesium-Vitamin D (CALCIUM MAGNESIUM PO) Take 1 tablet by mouth at bedtime.     Cholecalciferol (VITAMIN D3 ULTRA STRENGTH) 125 MCG (5000 UT) capsule Take 5,000 Units by mouth daily.     [START ON 02/01/2023] clonazePAM (KLONOPIN) 0.5 MG tablet Take 1 tablet (0.5 mg total) by mouth daily. 30 tablet 2   denosumab (PROLIA) 60 MG/ML SOSY injection Inject 60 mg into the skin every 6 (six) months.     doxepin (SINEQUAN) 25 MG capsule Take 1 capsule (25 mg total) by mouth at bedtime. 30 capsule 3   doxycycline (VIBRA-TABS) 100 MG tablet Take 1 tablet (100 mg total) by mouth 2 (two) times daily. 10 tablet 0   Evolocumab (REPATHA SURECLICK) 140 MG/ML SOAJ Inject 140 mg into the skin every 21 ( twenty-one)  days.     famotidine (PEPCID) 20 MG tablet TAKE 1 TABLET BY MOUTH TWICE A DAY 60 tablet 11   ibuprofen (ADVIL) 200 MG tablet Take 400 mg by mouth every 6 (six) hours as needed for moderate pain.     ipratropium-albuterol (DUONEB) 0.5-2.5 (3) MG/3ML SOLN Take 3 mLs by nebulization every 6 (six) hours as needed. 120 mL 5   nitroGLYCERIN (NITROSTAT) 0.4 MG SL tablet Place 1 tablet (0.4 mg total) under the tongue every 5 (five) minutes as needed for chest pain. 25 tablet 11   oxyCODONE (ROXICODONE) 5 MG immediate release tablet Take 1 tablet (5 mg total) by mouth every 6 (six) hours as needed for severe pain. 10 tablet 0   pindolol (VISKEN) 5 MG tablet Take 1 tablet (5 mg total) by mouth daily after breakfast. 30 tablet 3   risperiDONE (RISPERDAL) 0.5 MG  tablet Take 1 tablet (0.5 mg total) by mouth 2 (two) times daily at 8 am and 4 pm. 60 tablet 3   tamoxifen (NOLVADEX) 20 MG tablet Take 1 tablet (20 mg total) by mouth daily. 30 tablet 3   venlafaxine XR (EFFEXOR XR) 75 MG 24 hr capsule Take 3 capsules (225 mg total) by mouth daily with breakfast. 90 capsule 1   No current facility-administered medications for this visit.     Psychiatric Specialty Exam: Review of Systems  There were no vitals taken for this visit.There is no height or weight on file to calculate BMI.  General Appearance: Well Groomed  Eye Contact:  Good  Speech:  Clear and Coherent and Normal Rate  Volume:  Normal  Mood:  Euthymic  Affect:  Congruent  Thought Process:  Coherent and Goal Directed  Orientation:  Full (Time, Place, and Person)  Thought Content: Logical   Suicidal Thoughts:  No  Homicidal Thoughts:  No  Memory:  NA  Judgement:  Good  Insight:  Fair  Psychomotor Activity:  Restlessness and primarily in the morning  Concentration:  Concentration: Good  Recall:  Fair  Fund of Knowledge: Fair  Language: Good  Akathisia:  No    AIMS (if indicated): done  Assets:  Communication Skills Desire for Improvement Housing Intimacy Social Support Transportation Vocational/Educational  ADL's:  Intact  Cognition: WNL  Sleep:  Poor   Metabolic Disorder Labs: Lab Results  Component Value Date   HGBA1C 5.6 05/26/2022   MPG 117 10/22/2008   Lab Results  Component Value Date   PROLACTIN 15.4 08/12/2022   Lab Results  Component Value Date   CHOL 208 (H) 05/26/2022   TRIG 113.0 05/26/2022   HDL 74.00 05/26/2022   CHOLHDL 3 05/26/2022   VLDL 22.6 05/26/2022   LDLCALC 111 (H) 05/26/2022   LDLCALC 95 07/06/2021   Lab Results  Component Value Date   TSH 1.788 08/12/2022   TSH 1.43 05/26/2022    Therapeutic Level Labs: No results found for: "LITHIUM" No results found for: "VALPROATE" No results found for: "CBMZ"   Screenings: AIMS     Flowsheet Row Admission (Discharged) from 08/12/2022 in [facility]AL [facility]  AIMS Total Score 2      AUDIT    Flowsheet Row Admission (Discharged) from 08/12/2022 in [facility] BEHAVIORAL MEDICINE  Alcohol Use Disorder Identification Test Final Score (AUDIT) 0      GAD-7    Flowsheet Row Video Visit from 10/13/2022 in [facility] at [facility] Office [facility] from [facility]  Office Visit from 05/26/2022 in [facility] Office Visit from 01/16/2017 in [facility]  Total GAD-7 Score 0 11 11 3       PHQ2-9    Flowsheet Row Office Visit from 01/19/2023 in [facility] at [facility] Video Visit from 10/13/2022 in [facility] at [facility] Telephone from 08/19/2022 in [facility] Office Visit from 06/30/2022 in [facility] Office Visit from 05/26/2022 in [facility] at [facility]  PHQ-2 Total Score 0 0 2 3 3   PHQ-9 Total Score -- 0 -- 4 4      Flowsheet Row Admission (Discharged) from 11/04/2022 in [facility] Admission (Discharged) from 10/18/2022 in [facility] Pre-Admission Testing 60 from 10/13/2022 in [facility] PREADMISSION TESTING  C-SSRS RISK CATEGORY No Risk No Risk No Risk       Collaboration of Care: Collaboration of Care: Medication Management AEB medication prescription, Primary Care Provider AEB chart review, and Other provider involved in patient's care AEB oncology chart review  Patient/Guardian was advised Release of Information must be obtained prior to any record release in order to collaborate their care with an outside provider. Patient/Guardian was advised if they have not already done so to contact the  registration department to sign all necessary forms in order for [name] to release information regarding their care.   Consent: Patient/Guardian gives verbal consent for treatment and assignment of benefits for services provided during this visit. Patient/Guardian expressed understanding and agreed to proceed.    Stasia Cavalier, MD 01/20/2023, 8:59 AM   Virtual Visit via Video Note  I connected with [name] on 01/20/23 at  8:30 AM EDT by a video enabled telemedicine application and verified that I am speaking with the correct person using two identifiers.  Location: Patient: Home Provider: Home Office   I discussed the limitations of evaluation and management by telemedicine and the availability of in person appointments. The patient expressed understanding and agreed to proceed.   I discussed the assessment and treatment plan with the patient. The patient was provided an opportunity to ask questions and all were answered. The patient agreed with the plan and demonstrated an understanding of the instructions.   The patient was advised to call back or seek an in-person evaluation if the symptoms worsen or if the condition fails to improve as anticipated.  I provided 25 minutes of non-face-to-face time during this encounter.   Stasia Cavalier, MD

## 2023-01-19 NOTE — Patient Instructions (Signed)
Chronic Back Pain Chronic back pain is back pain that lasts longer than 3 months. The cause of your back pain may not be known. Some common causes include: Wear and tear (degenerative disease) of the bones, disks, or tissues that connect bones to each other (ligaments) in your back. Inflammation and stiffness in your back (arthritis). If you have chronic back pain, you may have times when the pain is more intense (flare-ups). You can also learn to manage the pain with home care. Follow these instructions at home: Watch for any changes in your symptoms. Take these actions to help with your pain: Managing pain and stiffness     If told, put ice on the painful area. You may be told to apply ice for the first 24-48 hours after a flare-up starts. Put ice in a plastic bag. Place a towel between your skin and the bag. Leave the ice on for 20 minutes, 2-3 times per day. If told, apply heat to the affected area as often as told by your health care provider. Use the heat source that your provider recommends, such as a moist heat pack or a heating pad. Place a towel between your skin and the heat source. Leave the heat on for 20-30 minutes. If your skin turns bright red, remove the ice or heat right away to prevent skin damage. The risk of damage is higher if you cannot feel pain, heat, or cold. Try soaking in a warm tub. Activity        Avoid bending and other activities that make the pain worse. Have good posture when you stand or sit. When you stand, keep your upper back and neck straight, with your shoulders pulled back. Avoid slouching. When you sit, keep your back straight. Relax your shoulders. Do not round your shoulders or pull them backward. Do not sit or stand in one place for too long. Take brief periods of rest during the day. This will reduce your pain. Resting in a lying or standing position is often better than sitting to rest. When you rest for longer periods, mix in some mild  activity or stretching between periods of rest. This will help to prevent stiffness and pain. Get regular exercise. Ask your provider what activities are safe for you. You may have to avoid lifting. Ask your provider how much you can safely lift. If you do lift, always use the right technique. This means you should: Bend your knees. Keep the load close to your body. Avoid twisting. Medicines Take over-the-counter and prescription medicines only as told by your provider. You may need to take medicines for pain and inflammation. These may be taken by mouth or put on the skin. You may also be given muscle relaxants. Ask your provider if the medicine prescribed to you: Requires you to avoid driving or using machinery. Can cause constipation. You may need to take these actions to prevent or treat constipation: Drink enough fluid to keep your pee (urine) pale yellow. Take over-the-counter or prescription medicines. Eat foods that are high in fiber, such as beans, whole grains, and fresh fruits and vegetables. Limit foods that are high in fat and processed sugars, such as fried or sweet foods. General instructions  Sleep on a firm mattress in a comfortable position. Try lying on your side with your knees slightly bent. If you lie on your back, put a pillow under your knees. Do not use any products that contain nicotine or tobacco. These products include cigarettes, chewing tobacco,   and vaping devices, such as e-cigarettes. If you need help quitting, ask your provider. Contact a health care provider if: You have pain that does not get better with rest or medicine. You have new pain. You have a fever. You lose weight quickly. You have trouble doing your normal activities. You feel weak or numb in one or both of your legs or feet. Get help right away if: You are not able to control when you pee or poop. You have severe back pain and: Nausea or vomiting. Pain in your chest or abdomen. Shortness  of breath. You faint. These symptoms may be an emergency. Get help right away. Call 911. Do not wait to see if the symptoms will go away. Do not drive yourself to the hospital. This information is not intended to replace advice given to you by your health care provider. Make sure you discuss any questions you have with your health care provider. Document Revised: 01/24/2022 Document Reviewed: 01/24/2022 Elsevier Patient Education  2024 [employer]  

## 2023-01-20 ENCOUNTER — Other Ambulatory Visit: Payer: Self-pay

## 2023-01-20 ENCOUNTER — Ambulatory Visit
Admission: RE | Admit: 2023-01-20 | Discharge: 2023-01-20 | Disposition: A | Discharge status: Home / Self Care | Payer: PPO | Source: Ambulatory Visit | Attending: Radiation Oncology | Admitting: Radiation Oncology

## 2023-01-20 ENCOUNTER — Telehealth (HOSPITAL_COMMUNITY): Payer: PPO | Admitting: Psychiatry

## 2023-01-20 ENCOUNTER — Ambulatory Visit: Payer: PPO

## 2023-01-20 ENCOUNTER — Encounter (HOSPITAL_COMMUNITY): Payer: Self-pay | Admitting: Psychiatry

## 2023-01-20 DIAGNOSIS — F32A Depression, unspecified: Secondary | ICD-10-CM

## 2023-01-20 DIAGNOSIS — F419 Anxiety disorder, unspecified: Secondary | ICD-10-CM | POA: Diagnosis not present

## 2023-01-20 DIAGNOSIS — C50512 Malignant neoplasm of lower-outer quadrant of left female breast: Secondary | ICD-10-CM | POA: Diagnosis not present

## 2023-01-20 LAB — RAD ONC ARIA SESSION SUMMARY
Course Elapsed Days: 25
Plan Fractions Treated to Date: 4
Plan Prescribed Dose Per Fraction: 2 Gy
Plan Total Fractions Prescribed: 5
Plan Total Prescribed Dose: 10 Gy
Reference Point Dosage Given to Date: 8 Gy
Reference Point Session Dosage Given: 2 Gy
Session Number: 19

## 2023-01-20 MED ORDER — PINDOLOL 5 MG PO TABS
5.0000 mg | ORAL_TABLET | Freq: Every day | ORAL | 3 refills | Status: DC
Start: 2023-01-20 — End: 2023-04-27

## 2023-01-20 MED ORDER — RISPERIDONE 0.5 MG PO TABS
0.5000 mg | ORAL_TABLET | ORAL | 3 refills | Status: DC
Start: 2023-01-20 — End: 2023-03-24

## 2023-01-20 MED ORDER — VENLAFAXINE HCL ER 75 MG PO CP24
225.0000 mg | ORAL_CAPSULE | Freq: Every day | ORAL | 1 refills | Status: DC
Start: 2023-01-20 — End: 2023-03-24

## 2023-01-20 MED ORDER — CLONAZEPAM 0.5 MG PO TABS
0.5000 mg | ORAL_TABLET | Freq: Every day | ORAL | 2 refills | Status: DC
Start: 2023-02-01 — End: 2023-04-27

## 2023-01-23 ENCOUNTER — Other Ambulatory Visit: Payer: Self-pay

## 2023-01-23 ENCOUNTER — Ambulatory Visit
Admission: RE | Admit: 2023-01-23 | Discharge: 2023-01-23 | Disposition: A | Discharge status: Home / Self Care | Payer: PPO | Source: Ambulatory Visit | Attending: Radiation Oncology | Admitting: Radiation Oncology

## 2023-01-23 DIAGNOSIS — C50512 Malignant neoplasm of lower-outer quadrant of left female breast: Secondary | ICD-10-CM | POA: Diagnosis not present

## 2023-01-23 DIAGNOSIS — Z510 Encounter for antineoplastic radiation therapy: Secondary | ICD-10-CM | POA: Diagnosis not present

## 2023-01-23 DIAGNOSIS — Z170 Estrogen receptor positive status [ER+]: Secondary | ICD-10-CM | POA: Diagnosis not present

## 2023-01-23 LAB — RAD ONC ARIA SESSION SUMMARY
Course Elapsed Days: 28
Plan Fractions Treated to Date: 5
Plan Prescribed Dose Per Fraction: 2 Gy
Plan Total Fractions Prescribed: 5
Plan Total Prescribed Dose: 10 Gy
Reference Point Dosage Given to Date: 10 Gy
Reference Point Session Dosage Given: 2 Gy
Session Number: 20

## 2023-01-24 NOTE — Radiation Completion Notes (Signed)
Patient Name: Victoria [name], Victoria [name] MRN: [id number] Date of Birth: [birth year] Referring Physician: Malachy Mood, M.D. Date of Service: 2023-01-24 Radiation Oncologist: Arnette Schaumann, M.D. [facility]                             RADIATION ONCOLOGY END OF TREATMENT NOTE     Diagnosis: C50.512 Malignant neoplasm of lower-outer quadrant of left female breast Staging on 2022-10-10: Primary malignant neoplasm of lower-outer quadrant of breast, left (HCC) T=cT1a, N=cN0, M=cM0 Intent: Curative     ==========DELIVERED PLANS==========  First Treatment Date: 2022-12-26 - Last Treatment Date: 2023-01-23   Plan Name: Breast_L Site: Breast, Left Technique: 3D Mode: Photon Dose Per Fraction: 2.67 Gy Prescribed Dose (Delivered / Prescribed): 40.05 Gy / 40.05 Gy Prescribed Fxs (Delivered / Prescribed): 15 / 15   Plan Name: Breast_L_Bst Site: Breast, Left Technique: 3D Mode: Photon Dose Per Fraction: 2 Gy Prescribed Dose (Delivered / Prescribed): 10 Gy / 10 Gy Prescribed Fxs (Delivered / Prescribed): 5 / 5     ==========ON TREATMENT VISIT DATES========== 2022-12-27, 2023-01-03, 2023-01-10, 2023-01-17     ==========UPCOMING VISITS==========       ==========APPENDIX - ON TREATMENT VISIT NOTES==========   See weekly On Treatment Notes in Epic for details.

## 2023-01-30 ENCOUNTER — Telehealth: Payer: Self-pay | Admitting: Cardiology

## 2023-01-30 NOTE — Telephone Encounter (Signed)
Called pt in regards to CP while at church yesterday.  Reports symptoms were similar to events in March.  Reports CP started took a NTG then felt faint and clammy.  Pastors voice became muffled felt as if would faint.  Was no longer having CP but took a 2nd NTG d/t feeling clammy.  Sat through rest of service was able to walk out without fainting.  Husband told her she was pale. Went home rest drank water began to feel better later in the day.  BP yesterday 99/66-61 once home from church.  Advise pt that NTG can drop BP and this could have caused her to feel faint.  Advised to increase PO fluid intake and eat a small salty snack if this occurs again to help bring BP up.   Pt reports since starting Pindolol 5 mg in February symptoms started.  Pt has noted BP running lower since starting this medication.  Pt does not keep a BP log advised to monitor and keep a BP log to send in for review.  Pt expresses understanding. Advised if CP returns to seek emergent care. Will send to provider to review.

## 2023-01-30 NOTE — Telephone Encounter (Signed)
Called pt advised of providers recommendations:  Pindolol will lower BP. It was prescribed by another provider. Recommend reaching out to prescribing provider to see if dosage can be lowered (2.5 mg is the lowest). Arrange follow up for evaluation of chest pain. Since Dr. Shari Prows was her cardiologist, can add on with DOD or schedule with me or another APP. If chest pain returns, go to the ED.  Tereso Newcomer, PA-C    01/30/2023 2:12 PM     Pt is agreeable to plan OV scheduled for 02/06/23 with Asa Lente, PA.

## 2023-01-30 NOTE — Telephone Encounter (Signed)
Pindolol will lower BP. It was prescribed by another provider. Recommend reaching out to prescribing provider to see if dosage can be lowered (2.5 mg is the lowest). Arrange follow up for evaluation of chest pain. Since Dr. Shari Prows was her cardiologist, can add on with DOD or schedule with me or another APP. If chest pain returns, go to the ED.  Tereso Newcomer, PA-C    01/30/2023 2:12 PM

## 2023-01-30 NOTE — Telephone Encounter (Signed)
  Pt c/o of Chest Pain: STAT if active CP, including tightness, pressure, jaw pain, radiating pain to shoulder/upper arm/back, CP unrelieved by Nitro. Symptoms reported of SOB, nausea, vomiting, sweating.  1. Are you having CP right now?   No  2. Are you experiencing any other symptoms (ex. SOB, nausea, vomiting, sweating)?  Sweating, felt like she was going to faint  3. Is your CP continuous or coming and going?   Continuous for about 10-15 minutes while at church  4. Have you taken Nitroglycerin?   Yes, twice yesterday  5. How long have you been experiencing CP?  Not having chest pains now  6. If NO CP at time of call then end call with telling Pt to call back or call 911 if Chest pain returns prior to return call from triage team.   Patient stated she is concerned she had an episode of chest pain yesterday at church.

## 2023-01-31 ENCOUNTER — Other Ambulatory Visit: Payer: Self-pay | Admitting: Pharmacist

## 2023-01-31 MED ORDER — REPATHA SURECLICK 140 MG/ML ~~LOC~~ SOAJ: 140.0000 mg | SUBCUTANEOUS | 11 refills | Status: DC

## 2023-02-01 ENCOUNTER — Ambulatory Visit: Payer: PPO | Attending: Physician Assistant | Admitting: Nurse Practitioner

## 2023-02-01 ENCOUNTER — Encounter: Payer: Self-pay | Admitting: Nurse Practitioner

## 2023-02-01 VITALS — BP 124/82 | HR 63 | Ht 61.0 in | Wt 165.6 lb

## 2023-02-01 DIAGNOSIS — I25118 Atherosclerotic heart disease of native coronary artery with other forms of angina pectoris: Secondary | ICD-10-CM | POA: Diagnosis not present

## 2023-02-01 DIAGNOSIS — R072 Precordial pain: Secondary | ICD-10-CM

## 2023-02-01 DIAGNOSIS — R0602 Shortness of breath: Secondary | ICD-10-CM

## 2023-02-01 DIAGNOSIS — I77810 Thoracic aortic ectasia: Secondary | ICD-10-CM | POA: Diagnosis not present

## 2023-02-01 DIAGNOSIS — R002 Palpitations: Secondary | ICD-10-CM

## 2023-02-01 DIAGNOSIS — E785 Hyperlipidemia, unspecified: Secondary | ICD-10-CM

## 2023-02-01 DIAGNOSIS — G4733 Obstructive sleep apnea (adult) (pediatric): Secondary | ICD-10-CM | POA: Diagnosis not present

## 2023-02-01 NOTE — Progress Notes (Signed)
Office Visit    Patient Name: Victoria [name] Date of Encounter: 02/01/2023  Primary Care Provider:  Bradd Canary, MD Primary Cardiologist:  Meriam Sprague, MD  Chief Complaint    67 year old female with a history of CAD s/p DES-OM2 in 2019, dilation of ascending aorta, PSVT, hyperlipidemia, iron deficiency anemia, asthma, and OSA who presents for follow-up related to CAD.  Past Medical History    Past Medical History:  Diagnosis Date   Allergic bronchopulmonary aspergillosis (HCC) 2008   sees Dr Wenda Overland pulmonology   Anemia    iron deficiency, resolved   Anxiety    Asthma    Breast cancer (HCC)    CAD (coronary artery disease)    a. LHC 6/16:  oOM1 60, pRCA 25 >> med Rx b. cath 3/19 2nd OM with 95% stenosis s/p synergy DES & anomalous RCA   CAP (community acquired pneumonia) 2016; 06/07/2016   Chronic bronchitis (HCC)    Chronic lower back pain    Complication of anesthesia    "think I have a hard time waking up from it"   Depression    mild   Diverticulitis    Diverticulosis    GERD (gastroesophageal reflux disease)    H/O hiatal hernia    Headache    "weekly" (08/23/2017)   History of echocardiogram    Echo 6/16:  Mod LVH, EF 60-65%, no RWMA, Gr 1 DD, trivial MR, normal LA size.   Hyperglycemia 11/20/2015   Hyperlipidemia, mixed 09/11/2007   Qualifier: Diagnosis of  By: Janit Bern   Did not tolerate Lipitor, zocor, Lovastatin, Pravastatin, Livalo, Crestor even low dose    IBS (irritable bowel syndrome)    Maxillary sinusitis    Normal cardiac stress test 11/2011   No evidence of ischemia or infarct.   Calculated ejection fraction 72%.   Obesity    OSA (obstructive sleep apnea) 02/2012   has stopped using  cpap   Osteoarthritis    Osteoporosis    Pneumonia 11/2011   "before 2013 I hadn't had pneumonia since I was a child" (04/13/2012)   Pulmonary nodules    S/P angioplasty with stent 08/23/17 ostial 2nd OM with DES synnergy 08/24/2017    Schatzki's ring    Past Surgical History:  Procedure Laterality Date   ANTERIOR AND POSTERIOR REPAIR N/A 10/18/2022   Procedure: ANTERIOR (CYSTOCELE);  Surgeon: Marcelle Overlie, MD;  Location: [facility] OR;  Service: Gynecology;  Laterality: N/A;   APPENDECTOMY  1989   BREAST BIOPSY  11/03/2022   MM LT RADIOACTIVE SEED LOC MAMMO GUIDE 11/03/2022 [facility] MAMMOGRAPHY   BREAST LUMPECTOMY WITH RADIOACTIVE SEED LOCALIZATION Left 11/04/2022   Procedure: LEFT BREAST LUMPECTOMY WITH RADIOACTIVE SEED LOCALIZATION;  Surgeon: Griselda Miner, MD;  Location: [facility];  Service: General;  Laterality: Left;   CARDIAC CATHETERIZATION N/A 11/25/2014   Procedure: Right/Left Heart Cath and Coronary Angiography;  Surgeon: Lyn Records, MD;  Location: [facility] INVASIVE CV LAB;  Service: Cardiovascular;  Laterality: N/A;   CESAREAN SECTION  1985   COLONOSCOPY  03/2022   CORONARY ANGIOPLASTY WITH STENT PLACEMENT  08/23/2017   CORONARY STENT INTERVENTION N/A 08/23/2017   Procedure: CORONARY STENT INTERVENTION;  Surgeon: Kathleene Hazel, MD;  Location: [facility] INVASIVE CV LAB;  Service: Cardiovascular;  Laterality: N/A;   HERNIA REPAIR  04/13/2012   VHR laparoscopic   LAPAROSCOPIC BILATERAL SALPINGO OOPHERECTOMY Bilateral 10/18/2022   Procedure: LAPAROTOMY WITH BILATERAL SALPINGO OOPHORECTOMY;  Surgeon: Marcelle Overlie, MD;  Location: [facility] OR;  Service: Gynecology;  Laterality: Bilateral;   LEFT HEART CATH AND CORONARY ANGIOGRAPHY N/A 08/23/2017   Procedure: LEFT HEART CATH AND CORONARY ANGIOGRAPHY;  Surgeon: Kathleene Hazel, MD;  Location: [facility] INVASIVE CV LAB;  Service: Cardiovascular;  Laterality: N/A;   VAGINAL HYSTERECTOMY N/A 10/18/2022   Procedure: HYSTERECTOMY VAGINAL;  Surgeon: Marcelle Overlie, MD;  Location: [facility] OR;  Service: Gynecology;  Laterality: N/A;   VENTRAL HERNIA REPAIR  04/13/2012   Procedure: LAPAROSCOPIC VENTRAL HERNIA;  Surgeon: [name], MD;  Location: [facility] OR;  Service:  General;  Laterality: N/A;  laparoscopic repair of incarcerated hernia    Allergies  Allergies  Allergen Reactions   Latuda [Lurasidone Hcl] Other (See Comments)    PER THE PT CAUSED RESTLESSNESS   Beclomethasone Dipropionate Hives and Other (See Comments)     weight gain   Flexeril [Cyclobenzaprine] Anxiety   Mometasone Furo-Formoterol Fum Hives and Other (See Comments)    weight gain   Sulfonamide Derivatives Hives and Rash   Statins Other (See Comments)    Myalgias, RLS     Labs/Other Studies Reviewed    The following studies were reviewed today:  Cardiac Studies & Procedures   CARDIAC CATHETERIZATION  CARDIAC CATHETERIZATION 08/23/2017  Narrative  Ost 2nd Mrg lesion is 95% stenosed.  A drug-eluting stent was successfully placed using a STENT SYNERGY DES 3X16.  Post intervention, there is a 0% residual stenosis.  The left ventricular systolic function is normal.  LV end diastolic pressure is normal.  The left ventricular ejection fraction is 55-65% by visual estimate.  There is no mitral valve regurgitation.  1. Severe single vessel CAD with severe stenosis in the ostium of the large obtuse marginal branch. 2. Successful PTCA/DES x 1 ostium of the OM. 3. 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 4. The LAD has no obstructive disease. 5. Normal LV systolic function  Recommendations: Will continue DAPT with ASA and Plavix for at least one year. Consider addition of a beta blocker or nitrate.  Findings Coronary Findings Diagnostic  Dominance: Right  Left Anterior Descending Vessel is large.  First Diagonal Branch Vessel is moderate in size.  Second Diagonal Branch Vessel is small in size.  Left Circumflex Vessel is large.  First Obtuse Marginal Branch Vessel is small in size.  Second Obtuse Marginal Branch Vessel is large in size. Ost 2nd Mrg lesion is 95% stenosed.  Right Coronary  Artery Vessel is large.  Intervention  Ost 2nd Mrg lesion Stent Pre-stent angioplasty was performed using a BALLOON EMERGE MR 2.5X12. A drug-eluting stent was successfully placed using a STENT SYNERGY DES 3X16. Stent strut is well apposed. Post-stent angioplasty was not performed. Post-Intervention Lesion Assessment The intervention was successful. Pre-interventional TIMI flow is 3. Post-intervention TIMI flow is 3. No complications occurred at this lesion. There is a 0% residual stenosis post intervention.   STRESS TESTS  MYOCARDIAL PERFUSION IMAGING 09/22/2022  Narrative   The study is normal. The study is low risk.   No ST deviation was noted.   Left ventricular function is normal. Nuclear stress EF: 73 %. The left ventricular ejection fraction is hyperdynamic (>65%). End diastolic cavity size is normal.   Prior study available for comparison from 03/15/2019.   ECHOCARDIOGRAM  ECHOCARDIOGRAM COMPLETE 03/20/2019  Narrative ECHOCARDIOGRAM REPORT    Patient Name:   FIRDAWS HARTZLER Date of Exam: 03/20/2019 Medical Rec #:  [id number]  Height:       61.0 in Accession #:    [id number]    Weight:       161.2 lb Date of Birth:  12-26-1955    BSA:          1.72 m Patient Age:    62 years      BP:           115/68 mmHg Patient Gender: F             HR:           84 bpm. Exam Location:  [address]  Procedure: 2D Echo, 3D Echo, Cardiac Doppler and Color Doppler  Indications:    R06.02 Shortness of Breath  History:        Patient has prior history of Echocardiogram examinations, most recent 11/28/2014. CAD; COPD Signs/Symptoms:Shortness of Breath Risk Factors:Sleep Apnea, Family History of Coronary Artery Disease and Dyslipidemia. Obesity, Asthma.  Sonographer:    Farrel Conners RDCS Referring Phys: [phone] TRACI R TURNER  IMPRESSIONS   1. Left ventricular ejection fraction, by visual estimation, is 60 to 65%. The left ventricle has normal function. Normal left ventricular  size. Mildly increased left ventricular posterior wall thickness. There is mildly increased left ventricular hypertrophy. 2. Elevated left ventricular end-diastolic pressure. 3. Left ventricular diastolic Doppler parameters are consistent with impaired relaxation pattern of LV diastolic filling. 4. Global right ventricle has normal systolic function.The right ventricular size is normal. No increase in right ventricular wall thickness. 5. Left atrial size was normal. 6. Right atrial size was normal. 7. The mitral valve is normal in structure. No evidence of mitral valve regurgitation. No evidence of mitral stenosis. 8. The tricuspid valve is normal in structure. Tricuspid valve regurgitation is trivial. 9. The aortic valve is normal in structure. Aortic valve regurgitation is mild by color flow Doppler. Structurally normal aortic valve, with no evidence of sclerosis or stenosis. 10. The pulmonic valve was normal in structure. Pulmonic valve regurgitation is not visualized by color flow Doppler. 11. Aortic dilatation noted. 12. There is mild dilatation of the ascending aorta measuring 36 mm. 13. Normal pulmonary artery systolic pressure. 14. The inferior vena cava is normal in size with greater than 50% respiratory variability, suggesting right atrial pressure of 3 mmHg.  FINDINGS Left Ventricle: Left ventricular ejection fraction, by visual estimation, is 60 to 65%. The left ventricle has normal function. Mildly increased left ventricular posterior wall thickness. There is mildly increased left ventricular hypertrophy. Normal left ventricular size. Spectral Doppler shows Left ventricular diastolic Doppler parameters are consistent with impaired relaxation pattern of LV diastolic filling. Elevated left ventricular end-diastolic pressure.  Right Ventricle: The right ventricular size is normal. No increase in right ventricular wall thickness. Global RV systolic function is has normal systolic  function. The tricuspid regurgitant velocity is 1.73 m/s, and with an assumed right atrial pressure of 3 mmHg, the estimated right ventricular systolic pressure is normal at 15.0 mmHg.  Left Atrium: Left atrial size was normal in size.  Right Atrium: Right atrial size was normal in size  Pericardium: There is no evidence of pericardial effusion.  Mitral Valve: The mitral valve is normal in structure. No evidence of mitral valve stenosis by observation. No evidence of mitral valve regurgitation.  Tricuspid Valve: The tricuspid valve is normal in structure. Tricuspid valve regurgitation is trivial by color flow Doppler.  Aortic Valve: The aortic valve is normal in structure. Aortic valve regurgitation is mild by color flow Doppler.  Aortic regurgitation PHT measures 397 msec. The aortic valve is structurally normal, with no evidence of sclerosis or stenosis.  Pulmonic Valve: The pulmonic valve was normal in structure. Pulmonic valve regurgitation is not visualized by color flow Doppler.  Aorta: Aortic dilatation noted. There is mild dilatation of the ascending aorta measuring 36 mm.  Venous: The inferior vena cava is normal in size with greater than 50% respiratory variability, suggesting right atrial pressure of 3 mmHg.  IAS/Shunts: No atrial level shunt detected by color flow Doppler. No ventricular septal defect is seen or detected. There is no evidence of an atrial septal defect.    LEFT VENTRICLE PLAX 2D LVIDd:         4.06 cm  Diastology LVIDs:         2.50 cm  LV e' lateral:   7.40 cm/s LV PW:         1.16 cm  LV E/e' lateral: 9.2 LV IVS:        0.91 cm  LV e' medial:    4.90 cm/s LVOT diam:     2.50 cm  LV E/e' medial:  13.9 LV SV:         50 ml LV SV Index:   27.87 LVOT Area:     4.91 cm   RIGHT VENTRICLE RV S prime:     18.90 cm/s TAPSE (M-mode): 3.0 cm  LEFT ATRIUM             Index       RIGHT ATRIUM           Index LA diam:        4.50 cm 2.61 cm/m  RA Area:      15.80 cm LA Vol (A2C):   36.4 ml 21.12 ml/m RA Volume:   40.40 ml  23.44 ml/m LA Vol (A4C):   27.5 ml 15.96 ml/m LA Biplane Vol: 35.1 ml 20.37 ml/m AORTIC VALVE LVOT Vmax:   121.50 cm/s LVOT Vmean:  70.550 cm/s LVOT VTI:    0.225 m AI PHT:      397 msec  AORTA Ao Root diam: 3.50 cm Ao Asc diam:  3.60 cm  MITRAL VALVE                        TRICUSPID VALVE MV Area (PHT): cm                  TR Peak grad:   12.0 mmHg MV PHT:        msec                 TR Vmax:        173.00 cm/s MV Decel Time: 227 msec MV E velocity: 68.15 cm/s 103 cm/s  SHUNTS MV A velocity: 73.00 cm/s 70.3 cm/s Systemic VTI:  0.22 m MV E/A ratio:  0.93       1.5       Systemic Diam: 2.50 cm   Chilton Si MD Electronically signed by Chilton Si MD Signature Date/Time: 03/20/2019/5:24:16 PM    Final    MONITORS  LONG TERM MONITOR (3-14 DAYS) 04/27/2022  Narrative   Patch wear time was 6 days and 22 hours   Predominant rhythm was NSR with average HR 78bpm   There were 6 runs of SVT with longest lasting 15 beats   Rare ectopy (<1% SVE and <1% VE)   No sustained arrhythmias or significant pauses   Patient  triggered events correlate with NSR   Patch Wear Time:  6 days and 22 hours (2023-10-28T13:57:51-0400 to 2023-11-04T12:21:20-0400)  Patient had a min HR of 48 bpm, max HR of 174 bpm, and avg HR of 78 bpm. Predominant underlying rhythm was Sinus Rhythm. 6 Supraventricular Tachycardia runs occurred, the run with the fastest interval lasting 9 beats with a max rate of 174 bpm, the longest lasting 15 beats with an avg rate of 124 bpm. Some episodes of Supraventricular Tachycardia may be possible Atrial Tachycardia with variable block. Isolated SVEs were rare (<1.0%), SVE Couplets were rare (<1.0%), and SVE Triplets were rare (<1.0%). Isolated VEs were rare (<1.0%, 132), VE Triplets were rare (<1.0%, 1), and no VE Couplets were present.  Laurance Flatten, MD          Recent Labs: 01/19/2023:  ALT 12; BUN 24; Creatinine, Ser 0.71; Hemoglobin 11.6; Magnesium 2.1; Platelets 270.0; Potassium 4.3; Sodium 138; TSH 1.76  Recent Lipid Panel    Component Value Date/Time   CHOL 207 (H) 01/19/2023 1453   CHOL 227 (H) 07/25/2016 0822   TRIG 170.0 (H) 01/19/2023 1453   HDL 62.70 01/19/2023 1453   HDL 51 07/25/2016 0822   CHOLHDL 3 01/19/2023 1453   VLDL 34.0 01/19/2023 1453   LDLCALC 111 (H) 01/19/2023 1453   LDLCALC 160 (H) 07/25/2016 0822   LDLDIRECT 177.8 09/07/2011 0945    History of Present Illness    67 year old female with the above past medical history including CAD s/p DES-OM2 in 2019, dilation of ascending aorta, PSVT, hyperlipidemia, iron deficiency anemia, asthma, and OSA.  She has a history of CAD and underwent cardiac catheterization in 2019 s/p DES-OM2, otherwise nonobstructive disease in the RCA and LAD.  Myoview in 2021 was low risk.  Echocardiogram in 2020 showed EF 60 to 65%, mildly increased posterior wall thickness, mild LVH, G1 DD, normal RV SF, trivial TR, mild AI, dilated aorta, 36 mm.  CT chest/aorta in 2022 showed no evidence of aneurysm.  Cardiac monitor in 04/2022 showed predominantly sinus rhythm, 6 runs of SVT, rare ectopy, no sustained arrhythmia.  She has a history of statin intolerance and is on Repatha.  She was last seen in office on 09/20/2022 and was stable from a cardiac standpoint.  Nuclear stress test in 09/2022 in the setting of chest pain was low risk.  She saw her PCP on 01/19/2023 and noted increased dyspnea. Repeat echocardiogram was ordered and is pending.  She contacted our office on 01/30/2023 with concern for recurrent chest pain, diaphoresis, and presyncope after taking nitroglycerin. She was noted to be hypotensive at the time.  She was advised to discuss decreased dosing of pindolol with her prescribing provider.  She presents today for follow-up.  Since her last visit has been stable from a cardiac standpoint.  She denies any further episodes of  chest pain, she does have some mild dyspnea on exertion, unchanged from the time of her last visit (symptoms were present at the time of most recent stress test).  She has noted some mild nonpitting bilateral lower extremity edema, right greater than left.  She denies any PND, orthopnea, rapid weight gain.  She has gained some weight since starting Risperdal, however, this has been gradual.    Home Medications    Current Outpatient Medications  Medication Sig Dispense Refill   acetaminophen (TYLENOL) 650 MG CR tablet Take 650 mg by mouth every 8 (eight) hours as needed for pain.     albuterol (VENTOLIN HFA) 108 (  90 Base) MCG/ACT inhaler Inhale 1-2 puffs into the lungs every 6 (six) hours as needed. 8 g 2   ascorbic acid (VITAMIN C) 500 MG tablet Take 500 mg by mouth daily.     aspirin EC 81 MG tablet Take 81 mg by mouth daily. Swallow whole.     Benralizumab (FASENRA PEN) 30 MG/ML SOAJ Inject 1 mL (30 mg total) into the skin every 8 (eight) weeks. 1 mL 2   budesonide (ENTOCORT EC) 3 MG 24 hr capsule Take 3 mg by mouth daily.     Calcium-Magnesium-Vitamin D (CALCIUM MAGNESIUM PO) Take 1 tablet by mouth at bedtime.     Cholecalciferol (VITAMIN D3 ULTRA STRENGTH) 125 MCG (5000 UT) capsule Take 5,000 Units by mouth daily.     clonazePAM (KLONOPIN) 0.5 MG tablet Take 1 tablet (0.5 mg total) by mouth daily. 30 tablet 2   denosumab (PROLIA) 60 MG/ML SOSY injection Inject 60 mg into the skin every 6 (six) months.     doxepin (SINEQUAN) 25 MG capsule Take 1 capsule (25 mg total) by mouth at bedtime. 30 capsule 3   Evolocumab (REPATHA SURECLICK) 140 MG/ML SOAJ Inject 140 mg into the skin every 14 (fourteen) days. 2 mL 11   famotidine (PEPCID) 20 MG tablet TAKE 1 TABLET BY MOUTH TWICE A DAY 60 tablet 11   ibuprofen (ADVIL) 200 MG tablet Take 400 mg by mouth every 6 (six) hours as needed for moderate pain.     ipratropium-albuterol (DUONEB) 0.5-2.5 (3) MG/3ML SOLN Take 3 mLs by nebulization every 6 (six)  hours as needed. 120 mL 5   nitroGLYCERIN (NITROSTAT) 0.4 MG SL tablet Place 1 tablet (0.4 mg total) under the tongue every 5 (five) minutes as needed for chest pain. 25 tablet 11   oxyCODONE (ROXICODONE) 5 MG immediate release tablet Take 1 tablet (5 mg total) by mouth every 6 (six) hours as needed for severe pain. 10 tablet 0   pindolol (VISKEN) 5 MG tablet Take 1 tablet (5 mg total) by mouth daily after breakfast. 30 tablet 3   risperiDONE (RISPERDAL) 0.5 MG tablet Take 1 tablet (0.5 mg total) by mouth 2 (two) times daily at 8 am and 4 pm. 60 tablet 3   tamoxifen (NOLVADEX) 20 MG tablet Take 1 tablet (20 mg total) by mouth daily. 30 tablet 3   venlafaxine XR (EFFEXOR XR) 75 MG 24 hr capsule Take 3 capsules (225 mg total) by mouth daily with breakfast. 90 capsule 1   doxycycline (VIBRA-TABS) 100 MG tablet Take 1 tablet (100 mg total) by mouth 2 (two) times daily. (Patient not taking: Reported on 02/01/2023) 10 tablet 0   No current facility-administered medications for this visit.     Review of Systems    She denies palpitations, pnd, orthopnea, n, v, dizziness, syncope, edema, weight gain, or early satiety. All other systems reviewed and are otherwise negative except as noted above.   Physical Exam    VS:  BP 124/82   Pulse 63   Ht 5\' 1"  (1.549 m)   Wt 165 lb 9.6 oz (75.1 kg)   SpO2 100%   BMI 31.29 kg/m   GEN: Well nourished, well developed, in no acute distress. HEENT: normal. Neck: Supple, no JVD, carotid bruits, or masses. Cardiac: RRR, no murmurs, rubs, or gallops. No clubbing, cyanosis, edema.  Radials/DP/PT 2+ and equal bilaterally.  Respiratory:  Respirations regular and unlabored, clear to auscultation bilaterally. GI: Soft, nontender, nondistended, BS + x 4. MS: no deformity  or atrophy. Skin: warm and dry, no rash. Neuro:  Strength and sensation are intact. Psych: Normal affect.  Accessory Clinical Findings    ECG personally reviewed by me today - EKG  Interpretation Date/Time:  Wednesday February 01 2023 10:59:17 EDT Ventricular Rate:  63 PR Interval:  162 QRS Duration:  78 QT Interval:  404 QTC Calculation: 413 R Axis:   5  Text Interpretation: Normal sinus rhythm Septal infarct , age undetermined When compared with ECG of 12-Aug-2022 15:24, Confirmed by Bernadene Person ([id number]) on 02/01/2023 11:22:50 AM  - no acute changes.   Lab Results  Component Value Date   WBC 5.2 01/19/2023   HGB 11.6 (L) 01/19/2023   HCT 35.5 (L) 01/19/2023   MCV 89.7 01/19/2023   PLT 270.0 01/19/2023   Lab Results  Component Value Date   CREATININE 0.71 01/19/2023   BUN 24 (H) 01/19/2023   NA 138 01/19/2023   K 4.3 01/19/2023   CL 101 01/19/2023   CO2 29 01/19/2023   Lab Results  Component Value Date   ALT 12 01/19/2023   AST 14 01/19/2023   ALKPHOS 76 01/19/2023   BILITOT 0.2 01/19/2023   Lab Results  Component Value Date   CHOL 207 (H) 01/19/2023   HDL 62.70 01/19/2023   LDLCALC 111 (H) 01/19/2023   LDLDIRECT 177.8 09/07/2011   TRIG 170.0 (H) 01/19/2023   CHOLHDL 3 01/19/2023    Lab Results  Component Value Date   HGBA1C 5.4 01/19/2023    Assessment & Plan   1. CAD/Chest pain/dyspnea: S/p DES-OM2 in 2019.  She has had rare episodes of chest discomfort, relieved with nitroglycerin.  She has stable dyspnea, unchanged from prior visits.  She denies exertional chest pain.  Recent stress test was low risk, overall reassuring.  She did take nitroglycerin recently and had a hypotensive response. She was symptomatic with her low BP.  She has stable dyspnea, unchanged from prior visits, she has noted mild nonpitting bilateral lower extremity edema, right greater than left.  Generally euvolemic and well compensated on exam.  Repeat echo pending per PCP.    Advised her to notify us of any progressive symptoms, we discussed possible repeat stress testing should she have progressive symptoms (consider cardiac PET stress test), versus addition of  antianginal therapy. Reviewed ED precautions.  Continue aspirin, Repatha.  2. Dilation of ascending aorta: Echocardiogram in 2020 showed EF 60 to 65%, mildly increased posterior wall thickness, mild LVH, G1 DD, normal RV SF, trivial TR, mild AI, dilated aorta, 36 mm. CT chest/aorta in 2022 showed no evidence of aneurysm.  BP well-controlled.  Repeat echo pending per PCP.   3. PSVT: Cardiac monitor in 04/2022 showed predominantly sinus rhythm, 6 runs of SVT, rare ectopy, no sustained arrhythmia.  Denies any recent palpitations.  4. Hyperlipidemia: LDL was 111 in 01/2023.  History of statin intolerance.   She was not taking Repatha at the time of her most recent lipid panel.  She plans to restart.  Consider repeat fasting lipids in 3 months.  5. OSA: Not on CPAP.   6. Disposition: Follow-up as scheduled with Dr. Antoine Poche in October 2024.       Joylene Grapes, NP 02/01/2023, 1:42 PM

## 2023-02-01 NOTE — Patient Instructions (Signed)
Medication Instructions:  Your physician recommends that you continue on your current medications as directed. Please refer to the Current Medication list given to you today.   *If you need a refill on your cardiac medications before your next appointment, please call your pharmacy*   Lab Work: NONE ordered at this time of appointment     Testing/Procedures: NONE ordered at this time of appointment    Follow-Up: At [facility], you and your health needs are our priority.  As part of our continuing mission to provide you with exceptional heart care, we have created designated Provider Care Teams.  These Care Teams include your primary Cardiologist (physician) and Advanced Practice Providers (APPs -  Physician Assistants and Nurse Practitioners) who all work together to provide you with the care you need, when you need it.  We recommend signing up for the patient portal called "MyChart".  Sign up information is provided on this After Visit Summary.  MyChart is used to connect with patients for Virtual Visits (Telemedicine).  Patients are able to view lab/test results, encounter notes, upcoming appointments, etc.  Non-urgent messages can be sent to your provider as well.   To learn more about what you can do with MyChart, go to [email or web address].    Your next appointment:    Keep follow up  Provider:   Dr.Hochrein

## 2023-02-02 DIAGNOSIS — M545 Low back pain, unspecified: Secondary | ICD-10-CM | POA: Diagnosis not present

## 2023-02-02 DIAGNOSIS — M4120 Other idiopathic scoliosis, site unspecified: Secondary | ICD-10-CM | POA: Diagnosis not present

## 2023-02-02 DIAGNOSIS — M542 Cervicalgia: Secondary | ICD-10-CM | POA: Diagnosis not present

## 2023-02-03 ENCOUNTER — Encounter: Payer: Self-pay | Admitting: Hematology and Oncology

## 2023-02-06 ENCOUNTER — Ambulatory Visit: Payer: PPO | Admitting: Physician Assistant

## 2023-02-07 ENCOUNTER — Telehealth: Payer: Self-pay

## 2023-02-07 NOTE — Telephone Encounter (Signed)
Patient called in to reports increased fatigue and "feeling low." Patient completed 20 radiation treatments to left  breast on 01/23/23. Informed patient that fatigue can last for a few weeks after treatment. Offered to reach out to social work due to patient feeling down. Patient declined at this time.

## 2023-02-08 ENCOUNTER — Ambulatory Visit: Payer: PPO | Admitting: Physical Therapy

## 2023-02-08 ENCOUNTER — Ambulatory Visit (INDEPENDENT_AMBULATORY_CARE_PROVIDER_SITE_OTHER): Payer: PPO | Admitting: Orthopaedic Surgery

## 2023-02-08 DIAGNOSIS — M4125 Other idiopathic scoliosis, thoracolumbar region: Secondary | ICD-10-CM | POA: Diagnosis not present

## 2023-02-08 NOTE — Progress Notes (Signed)
The patient is a 67 year old that I am seeing for the first time as a patient but her son is established with this office.  He has cerebral palsy.  She has been a long-term patient of the sports medicine clinic in town but her main physician has moved.  She has severe degenerative thoracolumbar scoliosis.  She occasionally has had epidural steroid injections in her back at [facility] imaging.  She also has had injections in both knees.  Her last injections in her knees were in July 2023 and her last epidural steroid injection at [facility] imaging was in March of this year.  Right now she is at her baseline in terms of pain that is not severe.  She manages this with alternating Tylenol and ibuprofen.  She is a patient of Dr. Darrow Bussing who is her primary care physician.  She is seeing a physician in [place] who perform scoliosis surgery on her son.  She actually sees him September 5 for new x-rays.  The last x-rays on the system she has of her knees and her back are from a year ago.  There is she also has MRIs.  She denies any change in bowel or bladder function.  She denies any numbness and tingling in her arms or legs.  She does report some left hip pain on occasion.  On exam she has significant scoliosis of her lumbar spine with a significant curvature however she has good flexion.  Her extension is limited.  She has good strength in the bilateral lower extremities.  Neither knee has any effusion today with excellent range of motion.  Both knees are ligamentously stable.  Previous x-rays of her knee shows some just mild arthritic changes with no malalignment.  Previous x-rays of her lumbar spine show a significant degenerative thoracolumbar scoliosis with chronic changes at multiple levels.  Today's appointment is about getting established with [name] in terms of an orthopedic office.  She is deferring any type of injection today because right now her pain is well-maintained with just Tylenol and ibuprofen.   If things worsen at all she knows to let us know and then we would proceed from there with any other interventions.  I did see the MRI of her lumbar spine as well.  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  If things worsen she will call us immediately.

## 2023-02-10 ENCOUNTER — Ambulatory Visit (INDEPENDENT_AMBULATORY_CARE_PROVIDER_SITE_OTHER): Payer: PPO | Admitting: *Deleted

## 2023-02-10 VITALS — BP 108/67 | HR 74

## 2023-02-10 DIAGNOSIS — Z0000 Encounter for general adult medical examination without abnormal findings: Secondary | ICD-10-CM | POA: Diagnosis not present

## 2023-02-10 DIAGNOSIS — M4120 Other idiopathic scoliosis, site unspecified: Secondary | ICD-10-CM | POA: Diagnosis not present

## 2023-02-10 DIAGNOSIS — M542 Cervicalgia: Secondary | ICD-10-CM | POA: Diagnosis not present

## 2023-02-10 DIAGNOSIS — M545 Low back pain, unspecified: Secondary | ICD-10-CM | POA: Diagnosis not present

## 2023-02-10 NOTE — Progress Notes (Signed)
Subjective:   Victoria [name] is a 67 y.o. female who presents for Medicare Annual (Subsequent) preventive examination.  Visit Complete: Virtual  I connected with  [name] on 02/10/23 by a audio enabled telemedicine application and verified that I am speaking with the correct person using two identifiers.  Patient Location: Home  Provider Location: Office/Clinic  I discussed the limitations of evaluation and management by telemedicine. The patient expressed understanding and agreed to proceed.   Review of Systems     Cardiac Risk Factors include: advanced age (>55men, >56 women);dyslipidemia;obesity (BMI >30kg/m2)     Objective:   Vital Signs: Vital signs are patient reported.  Today's Vitals   02/10/23 0910  BP: 108/67  Pulse: 74   There is no height or weight on file to calculate BMI.     02/10/2023    9:00 AM 12/05/2022   12:57 PM 11/04/2022    8:44 AM 10/28/2022   10:49 AM 10/19/2022    9:56 AM 10/13/2022   11:16 AM 08/12/2022    9:49 PM  Advanced Directives  Does Patient Have a Medical Advance Directive? Yes Yes Yes Yes Yes Yes   Type of Estate agent of [place];Living will Healthcare Power of [place];Living will Healthcare Power of [place];Living will Healthcare Power of [place];Living will  Healthcare Power of [place];Living will   Does patient want to make changes to medical advance directive? No - Patient declined No - Patient declined No - Patient declined No - Patient declined     Copy of Healthcare Power of Attorney in Chart? Yes - validated most recent copy scanned in chart (See row information) Yes - validated most recent copy scanned in chart (See row information) Yes - validated most recent copy scanned in chart (See row information) Yes - validated most recent copy scanned in chart (See row information) Yes - validated most recent copy scanned in chart (See row information) Yes - validated most recent copy scanned in chart (See row  information)   Would patient like information on creating a medical advance directive?   No - Patient declined         Information is confidential and restricted. Go to Review Flowsheets to unlock data.    Current Medications (verified) Outpatient Encounter Medications as of 02/10/2023  Medication Sig   acetaminophen (TYLENOL) 650 MG CR tablet Take 650 mg by mouth every 8 (eight) hours as needed for pain.   albuterol (VENTOLIN HFA) 108 (90 Base) MCG/ACT inhaler Inhale 1-2 puffs into the lungs every 6 (six) hours as needed.   ascorbic acid (VITAMIN C) 500 MG tablet Take 500 mg by mouth daily.   aspirin EC 81 MG tablet Take 81 mg by mouth daily. Swallow whole.   Benralizumab (FASENRA PEN) 30 MG/ML SOAJ Inject 1 mL (30 mg total) into the skin every 8 (eight) weeks.   budesonide (ENTOCORT EC) 3 MG 24 hr capsule Take 3 mg by mouth daily.   Calcium-Magnesium-Vitamin D (CALCIUM MAGNESIUM PO) Take 1 tablet by mouth at bedtime.   Cholecalciferol (VITAMIN D3 ULTRA STRENGTH) 125 MCG (5000 UT) capsule Take 5,000 Units by mouth daily.   clonazePAM (KLONOPIN) 0.5 MG tablet Take 1 tablet (0.5 mg total) by mouth daily.   denosumab (PROLIA) 60 MG/ML SOSY injection Inject 60 mg into the skin every 6 (six) months.   doxepin (SINEQUAN) 25 MG capsule Take 1 capsule (25 mg total) by mouth at bedtime.   Evolocumab (REPATHA SURECLICK) 140 MG/ML SOAJ Inject  140 mg into the skin every 14 (fourteen) days.   famotidine (PEPCID) 20 MG tablet TAKE 1 TABLET BY MOUTH TWICE A DAY   ibuprofen (ADVIL) 200 MG tablet Take 400 mg by mouth every 6 (six) hours as needed for moderate pain.   ipratropium-albuterol (DUONEB) 0.5-2.5 (3) MG/3ML SOLN Take 3 mLs by nebulization every 6 (six) hours as needed.   nitroGLYCERIN (NITROSTAT) 0.4 MG SL tablet Place 1 tablet (0.4 mg total) under the tongue every 5 (five) minutes as needed for chest pain.   oxyCODONE (ROXICODONE) 5 MG immediate release tablet Take 1 tablet (5 mg total) by mouth  every 6 (six) hours as needed for severe pain.   pindolol (VISKEN) 5 MG tablet Take 1 tablet (5 mg total) by mouth daily after breakfast.   risperiDONE (RISPERDAL) 0.5 MG tablet Take 1 tablet (0.5 mg total) by mouth 2 (two) times daily at 8 am and 4 pm.   tamoxifen (NOLVADEX) 20 MG tablet Take 1 tablet (20 mg total) by mouth daily.   venlafaxine XR (EFFEXOR XR) 75 MG 24 hr capsule Take 3 capsules (225 mg total) by mouth daily with breakfast.   [DISCONTINUED] doxycycline (VIBRA-TABS) 100 MG tablet Take 1 tablet (100 mg total) by mouth 2 (two) times daily. (Patient not taking: Reported on 02/01/2023)   No facility-administered encounter medications on file as of 02/10/2023.    Allergies (verified) Latuda [lurasidone hcl], Beclomethasone dipropionate, Flexeril [cyclobenzaprine], Mometasone furo-formoterol fum, Sulfonamide derivatives, and Statins   History: Past Medical History:  Diagnosis Date   Allergic bronchopulmonary aspergillosis (HCC) 2008   sees Dr Wenda Overland pulmonology   Anemia    iron deficiency, resolved   Anxiety    Asthma    Breast cancer (HCC)    CAD (coronary artery disease)    a. LHC 6/16:  oOM1 60, pRCA 25 >> med Rx b. cath 3/19 2nd OM with 95% stenosis s/p synergy DES & anomalous RCA   CAP (community acquired pneumonia) 2016; 06/07/2016   Chronic bronchitis (HCC)    Chronic lower back pain    Complication of anesthesia    "think I have a hard time waking up from it"   Depression    mild   Diverticulitis    Diverticulosis    GERD (gastroesophageal reflux disease)    H/O hiatal hernia    Headache    "weekly" (08/23/2017)   History of echocardiogram    Echo 6/16:  Mod LVH, EF 60-65%, no RWMA, Gr 1 DD, trivial MR, normal LA size.   Hyperglycemia 11/20/2015   Hyperlipidemia, mixed 09/11/2007   Qualifier: Diagnosis of  By: Janit Bern   Did not tolerate Lipitor, zocor, Lovastatin, Pravastatin, Livalo, Crestor even low dose    IBS (irritable bowel  syndrome)    Maxillary sinusitis    Normal cardiac stress test 11/2011   No evidence of ischemia or infarct.   Calculated ejection fraction 72%.   Obesity    OSA (obstructive sleep apnea) 02/2012   has stopped using  cpap   Osteoarthritis    Osteoporosis    Pneumonia 11/2011   "before 2013 I hadn't had pneumonia since I was a child" (04/13/2012)   Pulmonary nodules    S/P angioplasty with stent 08/23/17 ostial 2nd OM with DES synnergy 08/24/2017   Schatzki's ring    Past Surgical History:  Procedure Laterality Date   ANTERIOR AND POSTERIOR REPAIR N/A 10/18/2022   Procedure: ANTERIOR (CYSTOCELE);  Surgeon: Marcelle Overlie, MD;  Location[facility]  OR;  Service: Gynecology;  Laterality: N/A;   APPENDECTOMY  1989   BREAST BIOPSY  11/03/2022   MM LT RADIOACTIVE SEED LOC MAMMO GUIDE 11/03/2022 [facility] MAMMOGRAPHY   BREAST LUMPECTOMY WITH RADIOACTIVE SEED LOCALIZATION Left 11/04/2022   Procedure: LEFT BREAST LUMPECTOMY WITH RADIOACTIVE SEED LOCALIZATION;  Surgeon: Griselda Miner, MD;  Location: [facility];  Service: General;  Laterality: Left;   CARDIAC CATHETERIZATION N/A 11/25/2014   Procedure: Right/Left Heart Cath and Coronary Angiography;  Surgeon: Lyn Records, MD;  Location[facility] INVASIVE CV LAB;  Service: Cardiovascular;  Laterality: N/A;   CESAREAN SECTION  1985   COLONOSCOPY  03/2022   CORONARY ANGIOPLASTY WITH STENT PLACEMENT  08/23/2017   CORONARY STENT INTERVENTION N/A 08/23/2017   Procedure: CORONARY STENT INTERVENTION;  Surgeon: Kathleene Hazel, MD;  Location: [facility] INVASIVE CV LAB;  Service: Cardiovascular;  Laterality: N/A;   HERNIA REPAIR  04/13/2012   VHR laparoscopic   LAPAROSCOPIC BILATERAL SALPINGO OOPHERECTOMY Bilateral 10/18/2022   Procedure: LAPAROTOMY WITH BILATERAL SALPINGO OOPHORECTOMY;  Surgeon: Marcelle Overlie, MD;  Location: [facility] OR;  Service: Gynecology;  Laterality: Bilateral;   LEFT HEART CATH AND CORONARY ANGIOGRAPHY N/A 08/23/2017   Procedure:  LEFT HEART CATH AND CORONARY ANGIOGRAPHY;  Surgeon: Kathleene Hazel, MD;  Location: [facility] INVASIVE CV LAB;  Service: Cardiovascular;  Laterality: N/A;   VAGINAL HYSTERECTOMY N/A 10/18/2022   Procedure: HYSTERECTOMY VAGINAL;  Surgeon: Marcelle Overlie, MD;  Location: [facility] OR;  Service: Gynecology;  Laterality: N/A;   VENTRAL HERNIA REPAIR  04/13/2012   Procedure: LAPAROSCOPIC VENTRAL HERNIA;  Surgeon: [name], MD;  Location: [facility] OR;  Service: General;  Laterality: N/A;  laparoscopic repair of incarcerated hernia   Family History  Problem Relation Age of Onset   Breast cancer Mother 46       bilateral breast cancer dx. 47, met to liver   Hypertension Mother    Diabetes Mother    Diverticulosis Father    Prostate cancer Father    Breast cancer Sister 34       DCIS at 70, IDC at 67, PALB2+   Cancer Sister        breast cancer, invasive ductal carcinoma in 2022,DCIS at 57 with 4 weeks of radiation, 5 years of Tamoxifen    Pulmonary embolism Brother        recurrent   Heart attack Maternal Grandfather    Ovarian cancer Paternal Grandmother    Cerebral palsy Son    Prostate cancer Paternal Uncle    Breast cancer Niece 63       PALB2+   Osteoporosis Niece    Stroke Neg Hx    Colon cancer Neg Hx    Esophageal cancer Neg Hx    Stomach cancer Neg Hx    Rectal cancer Neg Hx    Social History   Socioeconomic History   Marital status: Married    Spouse name: Not on file   Number of children: 1   Years of education: 14   Highest education level: Associate degree: academic program  Occupational History   Occupation: Disabled   Tobacco Use   Smoking status: Never    Passive exposure: Never   Smokeless tobacco: Never  Vaping Use   Vaping status: Never Used  Substance and Sexual Activity   Alcohol use: Yes    Alcohol/week: 4.0 - 7.0 standard drinks of alcohol    Types: 4 - 7 Standard drinks or equivalent per week   Drug use:  No   Sexual activity: Yes    Birth  control/protection: Surgical    Comment: gluten free, lives with husband and son with CP quadriplegia  Other Topics Concern   Not on file  Social History Narrative   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    Lives at home with her husband and son.   Right-handed.   2 cups caffeine per day.   One story home   Social Determinants of Health   Financial Resource Strain: Low Risk  (01/18/2023)   Overall Financial Resource Strain (CARDIA)    Difficulty of Paying Living Expenses: Not hard at all  Food Insecurity: No Food Insecurity (01/18/2023)   Hunger Vital Sign    Worried About Running Out of Food in the Last Year: Never true    Ran Out of Food in the Last Year: Never true  Transportation Needs: No Transportation Needs (10/24/2022)   PRAPARE - Administrator, Civil Service (Medical): No    Lack of Transportation (Non-Medical): No  Physical Activity: Inactive (02/10/2023)   Exercise Vital Sign    Days of Exercise per Week: 0 days    Minutes of Exercise per Session: 0 min  Stress: Stress Concern Present (01/18/2023)   [employer] of Occupational Health - Occupational Stress Questionnaire    Feeling of Stress : To some extent  Social Connections: Unknown (01/18/2023)   Social Connection and Isolation Panel [NHANES]    Frequency of Communication with Friends and Family: Three times a week    Frequency of Social Gatherings with Friends and Family: Once a week    Attends Religious Services: More than 4 times per year    Active Member of [employer] or Organizations: Not on file    Attends Engineer, structural: Not on file    Marital Status: Married    Tobacco Counseling Counseling given: Not Answered   Clinical Intake:  Pre-visit preparation completed: Yes  Pain : No/denies pain  Nutritional Risks: None Diabetes: No  How often do you need to have someone help you when you read instructions, pamphlets, or other written materials from your doctor or pharmacy?: 1 -  Never  Interpreter Needed?: No  Information entered by :: [employer], CMA   Activities of Daily Living    02/10/2023    9:01 AM 11/04/2022    8:49 AM  In your present state of health, do you have any difficulty performing the following activities:  Hearing? 0 0  Vision? 0 0  Difficulty concentrating or making decisions? 0 0  Walking or climbing stairs? 1 0  Dressing or bathing? 0 0  Doing errands, shopping? 0   Preparing Food and eating ? N   Using the Toilet? N   In the past six months, have you accidently leaked urine? Y   Do you have problems with loss of bowel control? Y   Managing your Medications? N   Managing your Finances? N   Housekeeping or managing your Housekeeping? N     Patient Care Team: Bradd Canary, MD as PCP - General (Family Medicine) Meriam Sprague, MD as PCP - Cardiology (Cardiology) [place], Elvin So, MD as Referring Physician (Dermatology) Kalman Shan, MD as Consulting Physician (Pulmonary Disease) Lars Masson, MD as Consulting Physician (Cardiology) Marcelle Overlie, MD as Consulting Physician (Obstetrics and Gynecology) Suzanna Obey, MD as Consulting Physician (Otolaryngology) Pyrtle, Carie Caddy, MD as Consulting Physician (Gastroenterology) Donnelly Angelica, RN as Oncology Nurse Navigator Glenn, Dawn C,  RN as Oncology Nurse Navigator Griselda Miner, MD as Consulting Physician (General Surgery) Malachy Mood, MD as Consulting Physician (Hematology) Antony Blackbird, MD as Consulting Physician (Radiation Oncology)  Indicate any recent Medical Services you may have received from other than [facility] providers in the past year (date may be approximate).     Assessment:   This is a routine wellness examination for [place].  Hearing/Vision screen No results found.  Dietary issues and exercise activities discussed:     Goals Addressed   None    Depression Screen    02/10/2023    9:04 AM 01/19/2023    2:03 PM 10/13/2022    1:15  PM 08/19/2022    2:09 PM 06/30/2022    3:48 PM 05/26/2022    9:40 AM 11/25/2021    1:56 PM  PHQ 2/9 Scores  PHQ - 2 Score 0 0 0 2  3 0  PHQ- 9 Score   0   4 4     Information is confidential and restricted. Go to Review Flowsheets to unlock data.    Fall Risk    02/10/2023    9:04 AM 10/13/2022    1:15 PM 05/26/2022    9:39 AM 11/25/2021    1:56 PM 10/19/2021    9:03 AM  Fall Risk   Falls in the past year? 1 0 0 0 0  Number falls in past yr: 0 0 0 0 0  Injury with Fall? 0 0 0 0 0  Risk for fall due to : No Fall Risks   No Fall Risks No Fall Risks  Follow up Falls evaluation completed Falls evaluation completed Falls evaluation completed Falls evaluation completed Falls evaluation completed    MEDICARE RISK AT HOME: Medicare Risk at Home Any stairs in or around the home?: Yes (to enter the home) If so, are there any without handrails?: No Home free of loose throw rugs in walkways, pet beds, electrical cords, etc?: Yes Adequate lighting in your home to reduce risk of falls?: Yes Life alert?: No Use of a cane, walker or w/c?: No Grab bars in the bathroom?: Yes Shower chair or bench in shower?: No Elevated toilet seat or a handicapped toilet?: Yes (comfort height)  TIMED UP AND GO:  Was the test performed?  No    Cognitive Function:        02/10/2023    9:08 AM 10/19/2021    9:09 AM  6CIT Screen  What Year? 0 points 0 points  What month? 0 points 0 points  What time? 0 points 0 points  Count back from 20 0 points 0 points  Months in reverse 0 points 0 points  Repeat phrase 0 points 0 points  Total Score 0 points 0 points    Immunizations Immunization History  Administered Date(s) Administered   Influenza Split 04/20/2011, 04/20/2020   Influenza Whole 06/06/2007, 04/15/2008, 04/02/2009, 03/29/2012   Influenza, High Dose Seasonal PF 05/05/2015   Influenza,inj,Quad PF,6+ Mos 05/09/2013, 03/03/2014, 05/03/2016, 04/21/2017, 04/02/2018, 03/19/2019, 04/20/2020, 05/03/2021,  03/07/2022   Moderna Sars-Covid-2 Vaccination 08/15/2019, 09/17/2019, 05/16/2020, 12/04/2020   Pfizer Covid-19 Vaccine Bivalent Booster 51yrs & up 04/16/2021   Pneumococcal Conjugate-13 05/09/2013   Pneumococcal Polysaccharide-23 05/04/2005   Td 07/29/2009   Tdap 03/11/2015   Zoster Recombinant(Shingrix) 05/09/2019, 08/01/2019    TDAP status: Up to date  Flu Vaccine status: Due, Education has been provided regarding the importance of this vaccine. Advised may receive this vaccine at local pharmacy or Health Dept. Aware to  provide a copy of the vaccination record if obtained from local pharmacy or Health Dept. Verbalized acceptance and understanding.  Pneumococcal vaccine status: Due, Education has been provided regarding the importance of this vaccine. Advised may receive this vaccine at local pharmacy or Health Dept. Aware to provide a copy of the vaccination record if obtained from local pharmacy or Health Dept. Verbalized acceptance and understanding.  Covid-19 vaccine status: Information provided on how to obtain vaccines.   Qualifies for Shingles Vaccine? Yes   Zostavax completed No   Shingrix Completed?: Yes  Screening Tests Health Maintenance  Topic Date Due   COVID-19 Vaccine (6 - 2023-24 season) 02/18/2022   Pneumonia Vaccine 18+ Years old (3 of 3 - PPSV23 or PCV20) 05/06/2022   Medicare Annual Wellness (AWV)  10/20/2022   INFLUENZA VACCINE  01/19/2023   PAP SMEAR-Modifier  02/04/2023   MAMMOGRAM  09/11/2024   DTaP/Tdap/Td (3 - Td or Tdap) 03/10/2025   Colonoscopy  03/21/2032   DEXA SCAN  Completed   Hepatitis C Screening  Completed   HIV Screening  Completed   Zoster Vaccines- Shingrix  Completed   HPV VACCINES  Aged Out    Health Maintenance  Health Maintenance Due  Topic Date Due   COVID-19 Vaccine (6 - 2023-24 season) 02/18/2022   Pneumonia Vaccine 77+ Years old (3 of 3 - PPSV23 or PCV20) 05/06/2022   Medicare Annual Wellness (AWV)  10/20/2022   INFLUENZA  VACCINE  01/19/2023   PAP SMEAR-Modifier  02/04/2023    Colorectal cancer screening: Type of screening: Colonoscopy. Completed 03/21/22. Repeat every 10 years  Mammogram status: Completed 09/12/22. Repeat every year  Bone Density status: Completed 03/10/21. Results reflect: Bone density results: OSTEOPOROSIS. Repeat every 2 years.  Lung Cancer Screening: (Low Dose CT Chest recommended if Age 69-80 years, 20 pack-year currently smoking OR have quit w/in 15years.) does not qualify.   Additional Screening:  Hepatitis C Screening: does qualify; Completed 08/12/10  Vision Screening: Recommended annual ophthalmology exams for early detection of glaucoma and other disorders of the eye. Is the patient up to date with their annual eye exam?  Yes  Who is the provider or what is the name of the office in which the patient attends annual eye exams? [facility] If pt is not established with a provider, would they like to be referred to a provider to establish care? No .   Dental Screening: Recommended annual dental exams for proper oral hygiene  Diabetic Foot Exam: N/a  Community Resource Referral / Chronic Care Management: CRR required this visit?  No   CCM required this visit?  No     Plan:     I have personally reviewed and noted the following in the patient's chart:   Medical and social history Use of alcohol, tobacco or illicit drugs  Current medications and supplements including opioid prescriptions. Patient is currently taking opioid prescriptions. Information provided to patient regarding non-opioid alternatives. Patient advised to discuss non-opioid treatment plan with their provider. Functional ability and status Nutritional status Physical activity Advanced directives List of other physicians Hospitalizations, surgeries, and ER visits in previous 12 months Vitals Screenings to include cognitive, depression, and falls Referrals and appointments  In addition, I have  reviewed and discussed with patient certain preventive protocols, quality metrics, and best practice recommendations. A written personalized care plan for preventive services as well as general preventive health recommendations were provided to patient.     Donne Anon, CMA   02/10/2023   After  Visit Summary: (MyChart) Due to this being a telephonic visit, the after visit summary with patients personalized plan was offered to patient via MyChart   Nurse Notes: None

## 2023-02-10 NOTE — Patient Instructions (Signed)
Victoria [name] , Thank you for taking time to come for your Medicare Wellness Visit. I appreciate your ongoing commitment to your health goals. Please review the following plan we discussed and let me know if I can assist you in the future.   These are the goals we discussed:  Goals   None     This is a list of the screening recommended for you and due dates:  Health Maintenance  Topic Date Due   COVID-19 Vaccine (6 - 2023-24 season) 02/18/2022   Pneumonia Vaccine (3 of 3 - PPSV23 or PCV20) 05/06/2022   Flu Shot  01/19/2023   Pap Smear  02/04/2023   Medicare Annual Wellness Visit  02/10/2024   Mammogram  09/11/2024   DTaP/Tdap/Td vaccine (3 - Td or Tdap) 03/10/2025   Colon Cancer Screening  03/21/2032   DEXA scan (bone density measurement)  Completed   Hepatitis C Screening  Completed   HIV Screening  Completed   Zoster (Shingles) Vaccine  Completed   HPV Vaccine  Aged Out     Next appointment: Follow up in one year for your annual wellness visit.   Preventive Care 33 Years and Older, Female Preventive care refers to lifestyle choices and visits with your health care provider that can promote health and wellness. What does preventive care include? A yearly physical exam. This is also called an annual well check. Dental exams once or twice a year. Routine eye exams. Ask your health care provider how often you should have your eyes checked. Personal lifestyle choices, including: Daily care of your teeth and gums. Regular physical activity. Eating a healthy diet. Avoiding tobacco and drug use. Limiting alcohol use. Practicing safe sex. Taking low-dose aspirin every day. Taking vitamin and mineral supplements as recommended by your health care provider. What happens during an annual well check? The services and screenings done by your health care provider during your annual well check will depend on your age, overall health, lifestyle risk factors, and family history of  disease. Counseling  Your health care provider may ask you questions about your: Alcohol use. Tobacco use. Drug use. Emotional well-being. Home and relationship well-being. Sexual activity. Eating habits. History of falls. Memory and ability to understand (cognition). Work and work Astronomer. Reproductive health. Screening  You may have the following tests or measurements: Height, weight, and BMI. Blood pressure. Lipid and cholesterol levels. These may be checked every 5 years, or more frequently if you are over 54 years old. Skin check. Lung cancer screening. You may have this screening every year starting at age 81 if you have a 30-pack-year history of smoking and currently smoke or have quit within the past 15 years. Fecal occult blood test (FOBT) of the stool. You may have this test every year starting at age 10. Flexible sigmoidoscopy or colonoscopy. You may have a sigmoidoscopy every 5 years or a colonoscopy every 10 years starting at age 64. Hepatitis C blood test. Hepatitis B blood test. Sexually transmitted disease (STD) testing. Diabetes screening. This is done by checking your blood sugar (glucose) after you have not eaten for a while (fasting). You may have this done every 1-3 years. Bone density scan. This is done to screen for osteoporosis. You may have this done starting at age 3. Mammogram. This may be done every 1-2 years. Talk to your health care provider about how often you should have regular mammograms. Talk with your health care provider about your test results, treatment options, and if necessary,  the need for more tests. Vaccines  Your health care provider may recommend certain vaccines, such as: Influenza vaccine. This is recommended every year. Tetanus, diphtheria, and acellular pertussis (Tdap, Td) vaccine. You may need a Td booster every 10 years. Zoster vaccine. You may need this after age 65. Pneumococcal 13-valent conjugate (PCV13) vaccine. One  dose is recommended after age 55. Pneumococcal polysaccharide (PPSV23) vaccine. One dose is recommended after age 70. Talk to your health care provider about which screenings and vaccines you need and how often you need them. This information is not intended to replace advice given to you by your health care provider. Make sure you discuss any questions you have with your health care provider. Document Released: 07/03/2015 Document Revised: 02/24/2016 Document Reviewed: 04/07/2015 Elsevier Interactive Patient Education  2017 [employer].  Fall Prevention in the Home Falls can cause injuries. They can happen to people of all ages. There are many things you can do to make your home safe and to help prevent falls. What can I do on the outside of my home? Regularly fix the edges of walkways and driveways and fix any cracks. Remove anything that might make you trip as you walk through a door, such as a raised step or threshold. Trim any bushes or trees on the path to your home. Use bright outdoor lighting. Clear any walking paths of anything that might make someone trip, such as rocks or tools. Regularly check to see if handrails are loose or broken. Make sure that both sides of any steps have handrails. Any raised decks and porches should have guardrails on the edges. Have any leaves, snow, or ice cleared regularly. Use sand or salt on walking paths during winter. Clean up any spills in your garage right away. This includes oil or grease spills. What can I do in the bathroom? Use night lights. Install grab bars by the toilet and in the tub and shower. Do not use towel bars as grab bars. Use non-skid mats or decals in the tub or shower. If you need to sit down in the shower, use a plastic, non-slip stool. Keep the floor dry. Clean up any water that spills on the floor as soon as it happens. Remove soap buildup in the tub or shower regularly. Attach bath mats securely with double-sided  non-slip rug tape. Do not have throw rugs and other things on the floor that can make you trip. What can I do in the bedroom? Use night lights. Make sure that you have a light by your bed that is easy to reach. Do not use any sheets or blankets that are too big for your bed. They should not hang down onto the floor. Have a firm chair that has side arms. You can use this for support while you get dressed. Do not have throw rugs and other things on the floor that can make you trip. What can I do in the kitchen? Clean up any spills right away. Avoid walking on wet floors. Keep items that you use a lot in easy-to-reach places. If you need to reach something above you, use a strong step stool that has a grab bar. Keep electrical cords out of the way. Do not use floor polish or wax that makes floors slippery. If you must use wax, use non-skid floor wax. Do not have throw rugs and other things on the floor that can make you trip. What can I do with my stairs? Do not leave any items on  the stairs. Make sure that there are handrails on both sides of the stairs and use them. Fix handrails that are broken or loose. Make sure that handrails are as long as the stairways. Check any carpeting to make sure that it is firmly attached to the stairs. Fix any carpet that is loose or worn. Avoid having throw rugs at the top or bottom of the stairs. If you do have throw rugs, attach them to the floor with carpet tape. Make sure that you have a light switch at the top of the stairs and the bottom of the stairs. If you do not have them, ask someone to add them for you. What else can I do to help prevent falls? Wear shoes that: Do not have high heels. Have rubber bottoms. Are comfortable and fit you well. Are closed at the toe. Do not wear sandals. If you use a stepladder: Make sure that it is fully opened. Do not climb a closed stepladder. Make sure that both sides of the stepladder are locked into place. Ask  someone to hold it for you, if possible. Clearly mark and make sure that you can see: Any grab bars or handrails. First and last steps. Where the edge of each step is. Use tools that help you move around (mobility aids) if they are needed. These include: Canes. Walkers. Scooters. Crutches. Turn on the lights when you go into a dark area. Replace any light bulbs as soon as they burn out. Set up your furniture so you have a clear path. Avoid moving your furniture around. If any of your floors are uneven, fix them. If there are any pets around you, be aware of where they are. Review your medicines with your doctor. Some medicines can make you feel dizzy. This can increase your chance of falling. Ask your doctor what other things that you can do to help prevent falls. This information is not intended to replace advice given to you by your health care provider. Make sure you discuss any questions you have with your health care provider. Document Released: 04/02/2009 Document Revised: 11/12/2015 Document Reviewed: 07/11/2014 Elsevier Interactive Patient Education  2017 [employer].

## 2023-02-10 NOTE — Patient Instructions (Signed)
Pt not seen in office but looks like had labs.

## 2023-02-10 NOTE — Progress Notes (Signed)
Looks like pt only got lab done. No office visit. This encounter was created in error - please disregard. This encounter was created in error - please disregard.  Looks like pt only got lab no visit.

## 2023-02-13 ENCOUNTER — Ambulatory Visit (HOSPITAL_COMMUNITY)
Admission: RE | Admit: 2023-02-13 | Discharge: 2023-02-13 | Disposition: A | Discharge status: Home / Self Care | Payer: PPO | Source: Ambulatory Visit | Attending: Family Medicine | Admitting: Family Medicine

## 2023-02-13 DIAGNOSIS — E785 Hyperlipidemia, unspecified: Secondary | ICD-10-CM | POA: Diagnosis not present

## 2023-02-13 DIAGNOSIS — I080 Rheumatic disorders of both mitral and aortic valves: Secondary | ICD-10-CM | POA: Insufficient documentation

## 2023-02-13 DIAGNOSIS — R0602 Shortness of breath: Secondary | ICD-10-CM | POA: Diagnosis not present

## 2023-02-13 DIAGNOSIS — R0609 Other forms of dyspnea: Secondary | ICD-10-CM

## 2023-02-13 DIAGNOSIS — I509 Heart failure, unspecified: Secondary | ICD-10-CM | POA: Insufficient documentation

## 2023-02-13 DIAGNOSIS — R0600 Dyspnea, unspecified: Secondary | ICD-10-CM | POA: Insufficient documentation

## 2023-02-13 DIAGNOSIS — I429 Cardiomyopathy, unspecified: Secondary | ICD-10-CM | POA: Insufficient documentation

## 2023-02-13 DIAGNOSIS — I2510 Atherosclerotic heart disease of native coronary artery without angina pectoris: Secondary | ICD-10-CM | POA: Insufficient documentation

## 2023-02-13 DIAGNOSIS — G4730 Sleep apnea, unspecified: Secondary | ICD-10-CM | POA: Insufficient documentation

## 2023-02-13 DIAGNOSIS — R600 Localized edema: Secondary | ICD-10-CM | POA: Diagnosis not present

## 2023-02-13 LAB — ECHOCARDIOGRAM COMPLETE
Area-P 1/2: 3.03 cm2
Calc EF: 57.3 %
P 1/2 time: 653 msec
S' Lateral: 2.8 cm
Single Plane A2C EF: 56 %
Single Plane A4C EF: 58.8 %

## 2023-02-13 NOTE — Progress Notes (Signed)
  Echocardiogram 2D Echocardiogram has been performed.  Janalyn Harder 02/13/2023, 11:49 AM

## 2023-02-15 DIAGNOSIS — M542 Cervicalgia: Secondary | ICD-10-CM | POA: Diagnosis not present

## 2023-02-15 DIAGNOSIS — M545 Low back pain, unspecified: Secondary | ICD-10-CM | POA: Diagnosis not present

## 2023-02-15 DIAGNOSIS — M4120 Other idiopathic scoliosis, site unspecified: Secondary | ICD-10-CM | POA: Diagnosis not present

## 2023-02-21 DIAGNOSIS — M545 Low back pain, unspecified: Secondary | ICD-10-CM | POA: Diagnosis not present

## 2023-02-21 DIAGNOSIS — M542 Cervicalgia: Secondary | ICD-10-CM | POA: Diagnosis not present

## 2023-02-21 DIAGNOSIS — M4120 Other idiopathic scoliosis, site unspecified: Secondary | ICD-10-CM | POA: Diagnosis not present

## 2023-02-23 ENCOUNTER — Ambulatory Visit (HOSPITAL_BASED_OUTPATIENT_CLINIC_OR_DEPARTMENT_OTHER): Payer: PPO

## 2023-02-24 ENCOUNTER — Encounter: Payer: Self-pay | Admitting: Radiation Oncology

## 2023-02-26 NOTE — Progress Notes (Signed)
  Radiation Oncology         [phone] ________________________________  Name: Victoria [name] MRN: [id number]  Date: 02/27/2023  DOB: Aug 22, 1955  End of Treatment Note  Diagnosis: Stage (cT1a, cN0, cM0) Left Breast LOQ, Invasive Ductal Carcinoma, ER+ / PR+ / Her2-, Grade 1: s/p lumpectomy      Indication for treatment: Curative        Radiation treatment dates: 12/26/22 through 01/23/23   Site/dose:    1) Left breast - 40.05 Gy delivered in 15 Fx at 2.67 Gy/Fx 2) Left breast boost - 10 Gy delivered in 5 Fx at 2 Gy/Fx  Technique/Mode: 3D / Photon   Beams/energy: 10X / IMRT  Narrative: The patient tolerated radiation treatment relatively well. During her final weekly treatment check on 01/17/23, the patient endorsed fatigue, redness, and itching. Phyiscal exam performed on that same date showed some radiation dermatitis to the upper aspect of the left breast area without any skin breakdown. Minimal hyperpigmentation changes were also noted throughout the left breast. (She used hydrocortisone cream for her dermatitis and itching).   Plan: The patient has completed radiation treatment. The patient will return to radiation oncology clinic for routine followup in one month. I advised them to call or return sooner if they have any questions or concerns related to their recovery or treatment.  -----------------------------------  Billie Lade, PhD, MD  This document serves as a record of services personally performed by Antony Blackbird, MD. It was created on his behalf by Neena Rhymes, a trained medical scribe. The creation of this record is based on the scribe's personal observations and the provider's statements to them. This document has been checked and approved by the attending provider.

## 2023-02-26 NOTE — Progress Notes (Signed)
Radiation Oncology         [phone] ________________________________  Name: Victoria [name] MRN: [id number]  Date: 02/27/2023  DOB: 1956/03/04  Follow-Up Visit Note  CC: Bradd Canary, MD  Bradd Canary, MD  No diagnosis found.  Diagnosis: Stage (cT1a, cN0, cM0) Left Breast LOQ, Invasive Ductal Carcinoma, ER+ / PR+ / Her2-, Grade 1: s/p lumpectomy      Interval Since Last Radiation: 1 month and 4 days   Indication for treatment: Curative       Radiation treatment dates: 12/26/22 through 01/23/23  Site/dose:    1) Left breast - 40.05 Gy delivered in 15 Fx at 2.67 Gy/Fx 2) Left breast boost - 10 Gy delivered in 5 Fx at 2 Gy/Fx Technique/Mode: 3D / Photon  Beams/energy: 10X / IMRT  Narrative:  The patient returns today for routine follow-up. The patient tolerated radiation treatment relatively well. During her final weekly treatment check on 01/17/23, the patient endorsed fatigue, redness, and itching. Phyiscal exam performed on that same date showed some radiation dermatitis to the upper aspect of the left breast area without any skin breakdown. Minimal hyperpigmentation changes were also noted throughout the left breast. (She used hydrocortisone cream for her dermatitis and itching).         In the midst of radiation therapy, the patient followed up with Dr. Mosetta Putt on 01/12/23. During which time, the patient agreed to proceed with antiestrogen therapy consisting of tamoxifen x 5 years 2 weeks after completing XRT.          No other significant oncologic interval history since the patient completed radiation therapy.   ***                 Allergies:  is allergic to latuda [lurasidone hcl], beclomethasone dipropionate, flexeril [cyclobenzaprine], mometasone furo-formoterol fum, sulfonamide derivatives, and statins.  Meds: Current Outpatient Medications  Medication Sig Dispense Refill   acetaminophen (TYLENOL) 650 MG CR tablet Take 650 mg by mouth every 8 (eight) hours as  needed for pain.     albuterol (VENTOLIN HFA) 108 (90 Base) MCG/ACT inhaler Inhale 1-2 puffs into the lungs every 6 (six) hours as needed. 8 g 2   ascorbic acid (VITAMIN C) 500 MG tablet Take 500 mg by mouth daily.     aspirin EC 81 MG tablet Take 81 mg by mouth daily. Swallow whole.     Benralizumab (FASENRA PEN) 30 MG/ML SOAJ Inject 1 mL (30 mg total) into the skin every 8 (eight) weeks. 1 mL 2   budesonide (ENTOCORT EC) 3 MG 24 hr capsule Take 3 mg by mouth daily.     Calcium-Magnesium-Vitamin D (CALCIUM MAGNESIUM PO) Take 1 tablet by mouth at bedtime.     Cholecalciferol (VITAMIN D3 ULTRA STRENGTH) 125 MCG (5000 UT) capsule Take 5,000 Units by mouth daily.     clonazePAM (KLONOPIN) 0.5 MG tablet Take 1 tablet (0.5 mg total) by mouth daily. 30 tablet 2   denosumab (PROLIA) 60 MG/ML SOSY injection Inject 60 mg into the skin every 6 (six) months.     doxepin (SINEQUAN) 25 MG capsule Take 1 capsule (25 mg total) by mouth at bedtime. 30 capsule 3   Evolocumab (REPATHA SURECLICK) 140 MG/ML SOAJ Inject 140 mg into the skin every 14 (fourteen) days. 2 mL 11   famotidine (PEPCID) 20 MG tablet TAKE 1 TABLET BY MOUTH TWICE A DAY 60 tablet 11   ibuprofen (ADVIL) 200 MG tablet Take 400 mg by mouth every  6 (six) hours as needed for moderate pain.     ipratropium-albuterol (DUONEB) 0.5-2.5 (3) MG/3ML SOLN Take 3 mLs by nebulization every 6 (six) hours as needed. 120 mL 5   nitroGLYCERIN (NITROSTAT) 0.4 MG SL tablet Place 1 tablet (0.4 mg total) under the tongue every 5 (five) minutes as needed for chest pain. 25 tablet 11   oxyCODONE (ROXICODONE) 5 MG immediate release tablet Take 1 tablet (5 mg total) by mouth every 6 (six) hours as needed for severe pain. 10 tablet 0   pindolol (VISKEN) 5 MG tablet Take 1 tablet (5 mg total) by mouth daily after breakfast. 30 tablet 3   risperiDONE (RISPERDAL) 0.5 MG tablet Take 1 tablet (0.5 mg total) by mouth 2 (two) times daily at 8 am and 4 pm. 60 tablet 3   tamoxifen  (NOLVADEX) 20 MG tablet Take 1 tablet (20 mg total) by mouth daily. 30 tablet 3   venlafaxine XR (EFFEXOR XR) 75 MG 24 hr capsule Take 3 capsules (225 mg total) by mouth daily with breakfast. 90 capsule 1   No current facility-administered medications for this encounter.    Physical Findings: The patient is in no acute distress. Patient is alert and oriented.  vitals were not taken for this visit. .  No significant changes. Lungs are clear to auscultation bilaterally. Heart has regular rate and rhythm. No palpable cervical, supraclavicular, or axillary adenopathy. Abdomen soft, non-tender, normal bowel sounds.  Right Breast: no palpable mass, nipple discharge or bleeding. Left Breast: ***  Lab Findings: Lab Results  Component Value Date   WBC 5.2 01/19/2023   HGB 11.6 (L) 01/19/2023   HCT 35.5 (L) 01/19/2023   MCV 89.7 01/19/2023   PLT 270.0 01/19/2023    Radiographic Findings: ECHOCARDIOGRAM COMPLETE  Result Date: 02/13/2023    ECHOCARDIOGRAM REPORT   Patient Name:   Victoria [name] Date of Exam: 02/13/2023 Medical Rec #:  [id number]     Height:       61.0 in Accession #:    [id number]    Weight:       165.6 lb Date of Birth:  Feb 23, 1956    BSA:          1.743 m Patient Age:    66 years      BP:           109/71 mmHg Patient Gender: F             HR:           59 bpm. Exam Location:  Outpatient Procedure: 2D Echo, 3D Echo, Cardiac Doppler, Color Doppler and Strain Analysis Indications:    R06.02 SOB; R60.0 Lower extremity edema  History:        Patient has prior history of Echocardiogram examinations, most                 recent 03/20/2019. Cardiomyopathy and CHF, CAD, Aortic Valve                 Disease; Risk Factors:Sleep Apnea and Dyslipidemia. Breast                 cancer.  Sonographer:    Sheralyn Boatman RDCS Referring Phys: [id number] STACEY A BLYTH IMPRESSIONS  1. Left ventricular ejection fraction, by estimation, is 55 to 60%. Left ventricular ejection fraction by 3D volume is 55 %. The left  ventricle has normal function. The left ventricle has no regional wall motion abnormalities. There is mild left ventricular  hypertrophy. Left ventricular diastolic parameters are consistent with Grade I diastolic dysfunction (impaired relaxation).  2. Right ventricular systolic function is normal. The right ventricular size is mildly enlarged. There is normal pulmonary artery systolic pressure.  3. Right atrial size was mildly dilated.  4. The mitral valve is normal in structure. No evidence of mitral valve regurgitation. No evidence of mitral stenosis.  5. The aortic valve is tricuspid. There is mild calcification of the aortic valve. Aortic valve regurgitation is mild. No aortic stenosis is present.  6. The inferior vena cava is normal in size with greater than 50% respiratory variability, suggesting right atrial pressure of 3 mmHg. Comparison(s): Prior images reviewed side by side. FINDINGS  Left Ventricle: Left ventricular ejection fraction, by estimation, is 55 to 60%. Left ventricular ejection fraction by 3D volume is 55 %. The left ventricle has normal function. The left ventricle has no regional wall motion abnormalities. The left ventricular internal cavity size was normal in size. There is mild left ventricular hypertrophy. Left ventricular diastolic parameters are consistent with Grade I diastolic dysfunction (impaired relaxation). Right Ventricle: The right ventricular size is mildly enlarged. No increase in right ventricular wall thickness. Right ventricular systolic function is normal. There is normal pulmonary artery systolic pressure. The tricuspid regurgitant velocity is 2.14  m/s, and with an assumed right atrial pressure of 3 mmHg, the estimated right ventricular systolic pressure is 21.3 mmHg. Left Atrium: Left atrial size was normal in size. Right Atrium: Right atrial size was mildly dilated. Pericardium: There is no evidence of pericardial effusion. Mitral Valve: The mitral valve is normal in  structure. No evidence of mitral valve regurgitation. No evidence of mitral valve stenosis. Tricuspid Valve: The tricuspid valve is normal in structure. Tricuspid valve regurgitation is not demonstrated. No evidence of tricuspid stenosis. Aortic Valve: The aortic valve is tricuspid. There is mild calcification of the aortic valve. Aortic valve regurgitation is mild. Aortic regurgitation PHT measures 653 msec. No aortic stenosis is present. Pulmonic Valve: The pulmonic valve was normal in structure. Pulmonic valve regurgitation is not visualized. No evidence of pulmonic stenosis. Aorta: The aortic root is normal in size and structure. Venous: The inferior vena cava is normal in size with greater than 50% respiratory variability, suggesting right atrial pressure of 3 mmHg. IAS/Shunts: No atrial level shunt detected by color flow Doppler.  LEFT VENTRICLE PLAX 2D LVIDd:         4.00 cm         Diastology LVIDs:         2.80 cm         LV e' medial:    6.64 cm/s LV PW:         1.10 cm         LV E/e' medial:  9.4 LV IVS:        1.20 cm         LV e' lateral:   7.18 cm/s LVOT diam:     2.40 cm         LV E/e' lateral: 8.6 LV SV:         100 LV SV Index:   57 LVOT Area:     4.52 cm        3D Volume EF                                LV 3D EF:    Left  ventricul LV Volumes (MOD)                            ar LV vol d, MOD    81.2 ml                    ejection A2C:                                        fraction LV vol d, MOD    73.3 ml                    by 3D A4C:                                        volume is LV vol s, MOD    35.7 ml                    55 %. A2C: LV vol s, MOD    30.2 ml A4C:                           3D Volume EF: LV SV MOD A2C:   45.5 ml       3D EF:        55 % LV SV MOD A4C:   73.3 ml       LV EDV:       115 ml LV SV MOD BP:    45.8 ml       LV ESV:       52 ml                                LV SV:        63 ml RIGHT VENTRICLE             IVC RV S prime:      11.30 cm/s  IVC diam: 1.00 cm TAPSE (M-mode): 1.8 cm LEFT ATRIUM             Index        RIGHT ATRIUM           Index LA diam:        2.60 cm 1.49 cm/m   RA Area:     20.00 cm LA Vol (A2C):   51.5 ml 29.54 ml/m  RA Volume:   63.30 ml  36.31 ml/m LA Vol (A4C):   31.2 ml 17.90 ml/m LA Biplane Vol: 41.9 ml 24.04 ml/m  AORTIC VALVE             PULMONIC VALVE LVOT Vmax:   90.00 cm/s  PR End Diast Vel: 1.16 msec LVOT Vmean:  60.400 cm/s LVOT VTI:    0.220 m AI PHT:      653 msec  AORTA Ao Root diam: 3.17 cm Ao Asc diam:  3.50 cm MITRAL VALVE               TRICUSPID VALVE MV Area (PHT): 3.03 cm    TR Peak grad:   18.3 mmHg MV Decel Time: 250 msec    TR Vmax:  214.00 cm/s MV E velocity: 62.10 cm/s MV A velocity: 66.00 cm/s  SHUNTS MV E/A ratio:  0.94        Systemic VTI:  0.22 m                            Systemic Diam: 2.40 cm Donato Schultz MD Electronically signed by Donato Schultz MD Signature Date/Time: 02/13/2023/12:45:07 PM    Final     Impression: Stage (cT1a, cN0, cM0) Left Breast LOQ, Invasive Ductal Carcinoma, ER+ / PR+ / Her2-, Grade 1: s/p lumpectomy      The patient is recovering from the effects of radiation.  ***  Plan:  ***   *** minutes of total time was spent for this patient encounter, including preparation, face-to-face counseling with the patient and coordination of care, physical exam, and documentation of the encounter. ____________________________________  Billie Lade, PhD, MD  This document serves as a record of services personally performed by Antony Blackbird, MD. It was created on his behalf by Neena Rhymes, a trained medical scribe. The creation of this record is based on the scribe's personal observations and the provider's statements to them. This document has been checked and approved by the attending provider.

## 2023-02-27 ENCOUNTER — Ambulatory Visit
Admission: RE | Admit: 2023-02-27 | Discharge: 2023-02-27 | Disposition: A | Discharge status: Home / Self Care | Payer: PPO | Source: Ambulatory Visit | Attending: Radiation Oncology | Admitting: Radiation Oncology

## 2023-02-27 ENCOUNTER — Encounter: Payer: Self-pay | Admitting: Radiation Oncology

## 2023-02-27 VITALS — BP 98/68 | HR 63 | Temp 97.8°F | Resp 18 | Ht 61.0 in | Wt 166.2 lb

## 2023-02-27 DIAGNOSIS — Z7982 Long term (current) use of aspirin: Secondary | ICD-10-CM | POA: Insufficient documentation

## 2023-02-27 DIAGNOSIS — Z79810 Long term (current) use of selective estrogen receptor modulators (SERMs): Secondary | ICD-10-CM | POA: Insufficient documentation

## 2023-02-27 DIAGNOSIS — Z7952 Long term (current) use of systemic steroids: Secondary | ICD-10-CM | POA: Insufficient documentation

## 2023-02-27 DIAGNOSIS — Z923 Personal history of irradiation: Secondary | ICD-10-CM | POA: Insufficient documentation

## 2023-02-27 DIAGNOSIS — Z79899 Other long term (current) drug therapy: Secondary | ICD-10-CM | POA: Insufficient documentation

## 2023-02-27 DIAGNOSIS — C50512 Malignant neoplasm of lower-outer quadrant of left female breast: Secondary | ICD-10-CM | POA: Diagnosis not present

## 2023-02-27 DIAGNOSIS — Z170 Estrogen receptor positive status [ER+]: Secondary | ICD-10-CM | POA: Insufficient documentation

## 2023-02-27 HISTORY — DX: Personal history of irradiation: Z92.3

## 2023-02-27 NOTE — Progress Notes (Signed)
Victoria [name] is here today for follow up post radiation to the breast.   Breast Side:Left   They completed their radiation on: 01/23/23   Does the patient complain of any of the following: Post radiation skin issues: No, skin has healed.  Breast Tenderness: No Breast Swelling: No Lymphadema: No Range of Motion limitations: No Fatigue post radiation: Good  Appetite good/fair/poor: Good   Additional comments if applicable:   BP 98/68 (BP Location: Left Arm, Patient Position: Sitting)   Pulse 63   Temp 97.8 F (36.6 C) (Temporal)   Resp 18   Ht 5\' 1"  (1.549 m)   Wt 166 lb 4 oz (75.4 kg)   SpO2 100%   BMI 31.41 kg/m

## 2023-03-01 ENCOUNTER — Other Ambulatory Visit (HOSPITAL_BASED_OUTPATIENT_CLINIC_OR_DEPARTMENT_OTHER): Payer: Self-pay | Admitting: Orthopedic Surgery

## 2023-03-01 DIAGNOSIS — M4125 Other idiopathic scoliosis, thoracolumbar region: Secondary | ICD-10-CM

## 2023-03-01 DIAGNOSIS — M4120 Other idiopathic scoliosis, site unspecified: Secondary | ICD-10-CM | POA: Diagnosis not present

## 2023-03-01 DIAGNOSIS — M542 Cervicalgia: Secondary | ICD-10-CM | POA: Diagnosis not present

## 2023-03-01 DIAGNOSIS — M5459 Other low back pain: Secondary | ICD-10-CM

## 2023-03-01 DIAGNOSIS — M4004 Postural kyphosis, thoracic region: Secondary | ICD-10-CM

## 2023-03-01 DIAGNOSIS — M545 Low back pain, unspecified: Secondary | ICD-10-CM | POA: Diagnosis not present

## 2023-03-04 DIAGNOSIS — M79671 Pain in right foot: Secondary | ICD-10-CM | POA: Diagnosis not present

## 2023-03-05 ENCOUNTER — Ambulatory Visit (HOSPITAL_BASED_OUTPATIENT_CLINIC_OR_DEPARTMENT_OTHER)
Admission: RE | Admit: 2023-03-05 | Discharge: 2023-03-05 | Disposition: A | Discharge status: Home / Self Care | Payer: PPO | Source: Ambulatory Visit | Attending: Orthopedic Surgery | Admitting: Orthopedic Surgery

## 2023-03-05 DIAGNOSIS — M549 Dorsalgia, unspecified: Secondary | ICD-10-CM | POA: Diagnosis not present

## 2023-03-05 DIAGNOSIS — M4004 Postural kyphosis, thoracic region: Secondary | ICD-10-CM

## 2023-03-05 DIAGNOSIS — M48061 Spinal stenosis, lumbar region without neurogenic claudication: Secondary | ICD-10-CM | POA: Diagnosis not present

## 2023-03-05 DIAGNOSIS — M5459 Other low back pain: Secondary | ICD-10-CM

## 2023-03-05 DIAGNOSIS — M4125 Other idiopathic scoliosis, thoracolumbar region: Secondary | ICD-10-CM

## 2023-03-05 DIAGNOSIS — M47816 Spondylosis without myelopathy or radiculopathy, lumbar region: Secondary | ICD-10-CM | POA: Diagnosis not present

## 2023-03-05 DIAGNOSIS — K449 Diaphragmatic hernia without obstruction or gangrene: Secondary | ICD-10-CM | POA: Diagnosis not present

## 2023-03-05 DIAGNOSIS — M47814 Spondylosis without myelopathy or radiculopathy, thoracic region: Secondary | ICD-10-CM | POA: Diagnosis not present

## 2023-03-08 DIAGNOSIS — M545 Low back pain, unspecified: Secondary | ICD-10-CM | POA: Diagnosis not present

## 2023-03-08 DIAGNOSIS — M542 Cervicalgia: Secondary | ICD-10-CM | POA: Diagnosis not present

## 2023-03-08 DIAGNOSIS — M4120 Other idiopathic scoliosis, site unspecified: Secondary | ICD-10-CM | POA: Diagnosis not present

## 2023-03-14 ENCOUNTER — Other Ambulatory Visit: Payer: Self-pay | Admitting: Obstetrics and Gynecology

## 2023-03-14 DIAGNOSIS — Z6831 Body mass index (BMI) 31.0-31.9, adult: Secondary | ICD-10-CM | POA: Diagnosis not present

## 2023-03-14 DIAGNOSIS — C50912 Malignant neoplasm of unspecified site of left female breast: Secondary | ICD-10-CM | POA: Diagnosis not present

## 2023-03-14 DIAGNOSIS — Z1272 Encounter for screening for malignant neoplasm of vagina: Secondary | ICD-10-CM | POA: Diagnosis not present

## 2023-03-14 DIAGNOSIS — M816 Localized osteoporosis [Lequesne]: Secondary | ICD-10-CM | POA: Diagnosis not present

## 2023-03-14 DIAGNOSIS — Z124 Encounter for screening for malignant neoplasm of cervix: Secondary | ICD-10-CM | POA: Diagnosis not present

## 2023-03-14 DIAGNOSIS — N958 Other specified menopausal and perimenopausal disorders: Secondary | ICD-10-CM | POA: Diagnosis not present

## 2023-03-14 DIAGNOSIS — Z853 Personal history of malignant neoplasm of breast: Secondary | ICD-10-CM | POA: Diagnosis not present

## 2023-03-14 LAB — HM DEXA SCAN

## 2023-03-15 DIAGNOSIS — M545 Low back pain, unspecified: Secondary | ICD-10-CM | POA: Diagnosis not present

## 2023-03-15 DIAGNOSIS — M4120 Other idiopathic scoliosis, site unspecified: Secondary | ICD-10-CM | POA: Diagnosis not present

## 2023-03-15 DIAGNOSIS — M542 Cervicalgia: Secondary | ICD-10-CM | POA: Diagnosis not present

## 2023-03-16 DIAGNOSIS — C50912 Malignant neoplasm of unspecified site of left female breast: Secondary | ICD-10-CM | POA: Diagnosis not present

## 2023-03-16 DIAGNOSIS — Z6831 Body mass index (BMI) 31.0-31.9, adult: Secondary | ICD-10-CM | POA: Diagnosis not present

## 2023-03-16 DIAGNOSIS — M810 Age-related osteoporosis without current pathological fracture: Secondary | ICD-10-CM | POA: Diagnosis not present

## 2023-03-16 DIAGNOSIS — K219 Gastro-esophageal reflux disease without esophagitis: Secondary | ICD-10-CM | POA: Diagnosis not present

## 2023-03-16 DIAGNOSIS — E785 Hyperlipidemia, unspecified: Secondary | ICD-10-CM | POA: Diagnosis not present

## 2023-03-16 DIAGNOSIS — J449 Chronic obstructive pulmonary disease, unspecified: Secondary | ICD-10-CM | POA: Diagnosis not present

## 2023-03-21 ENCOUNTER — Ambulatory Visit: Payer: PPO | Admitting: Cardiology

## 2023-03-24 ENCOUNTER — Encounter (HOSPITAL_COMMUNITY): Payer: Self-pay | Admitting: Psychiatry

## 2023-03-24 ENCOUNTER — Telehealth (HOSPITAL_BASED_OUTPATIENT_CLINIC_OR_DEPARTMENT_OTHER): Payer: PPO | Admitting: Psychiatry

## 2023-03-24 DIAGNOSIS — F32A Depression, unspecified: Secondary | ICD-10-CM

## 2023-03-24 DIAGNOSIS — F419 Anxiety disorder, unspecified: Secondary | ICD-10-CM

## 2023-03-24 MED ORDER — VENLAFAXINE HCL ER 75 MG PO CP24: 225.0000 mg | ORAL_CAPSULE | Freq: Every day | ORAL | 1 refills | Status: DC

## 2023-03-24 MED ORDER — RISPERIDONE 0.5 MG PO TABS: ORAL_TABLET | ORAL | 2 refills | Status: DC

## 2023-03-24 MED ORDER — DOXEPIN HCL 25 MG PO CAPS: 25.0000 mg | ORAL_CAPSULE | Freq: Every day | ORAL | 3 refills | Status: DC

## 2023-03-24 NOTE — Progress Notes (Signed)
BH MD/PA/NP OP Progress Note  03/24/2023 12:55 PM Victoria [name]  MRN:  [id number]  Visit Diagnosis:    ICD-10-CM   1. Anxiety and depression  F41.9 risperiDONE (RISPERDAL) 0.5 MG tablet   F32.A doxepin (SINEQUAN) 25 MG capsule    venlafaxine XR (EFFEXOR XR) 75 MG 24 hr capsule      Assessment: Victoria [name] is a 67 y.o. female with a history of anxiety, depression, OSA who presented to [facility] at [facility] for initial evaluation on 06/30/2022.  At initial evaluation [name] symptoms of anxiety including feeling nervous or on edge, being unable to stop controlled worrying, worrying too much about different things, and fear that something awful would happen.  Patient's symptoms have been gradually progressing.  There was a period in November 2023 where she was having passive SI without intent or plan.  Patient also did endorse low mood, poor sleep, difficulty concentrating.  She does have a history of obstructive sleep apnea and does not use CPAP with last sleep study being over 10 years ago. Psychosocially patient has increased stress of caring for her adult son with cerebral palsy and planning the next steps for him after she and her husband pass or are no longer able to care for him.  Patient met criteria for MDD and generalized anxiety disorder. Of note on exam patient was found to have smacking of her lips repetitive movements of her mouth which are concerning for parkinsonism secondary to Zyprexa.    Victoria [name] [name] for follow-up evaluation. Today, 03/24/23, patient reports that other then some intermittent anxiety she has been stable the past 2 months. The anxiety is primarily situational in nature and she has been able to use grounding techniques with benefit. Patient does still endorse some concern of potential weight gain secondary to Risperdal. We will taper Risperdal evening dose to 0.25 mg today. If well tolerated we will continue to gradually  taper off the medication over the next several months.  mood and anxiety have been stable. She did opt to stay on Risperdal 0.5 mg for 1 year, though does endorse frustration about still experiencing weight gain. She also has had some increased restlessness in her legs during the night which has made it more difficult to fall asleep. For now she finds [name] manageable with behavioral adjustments. Will continue on her current regimen and follow up in 2 months.   Plan: - Continue Effexor XR 225 mg QD - Taper Risperdal 0.5 mg daily and 0.25 mg qafternoon - Continue Doxepin 25 mg QHS - Continue klonopin 0.5 mg QD prn for anxiety - Continue pindolol 5 mg QD - Recommend repeat sleep study, could consider dental devices OSA still present - CMP, CBC, lipid profile, Vit D, A1c, TSH reviewed - Continue with therapy every week through [place] therapy - Completed a 10 week wellness group through the [facility], followed by a group for special needs parents - Discussed other support groups - Crisis resources reviewed - Follow up in a month   Chief Complaint:  Chief Complaint  [name] with   Follow-up   HPI: Victoria [name] reporting that things are going pretty good over the past couple months. Though she can get a bit restless in her legs and can rock in the mornings. Today in particular has been a bit worse likely due to the stressor of her son being sick and the [facility] breaking. Victoria [name] some increase in worry that things would remain  a problem and could potentially impact her vacation next week. Victoria [name] as the trio was already postponed once due to her surgery. Patient reports the rumination and worry about [name] had improved by the end of the session.  On the positive side she has completed her radiation treatments and recovered well. Patient also notes that they are meeting someone in the next couple weeks who may have a group home that [name] could move  into.   Medication wise patient reports things still seem to be stable. She has not checked her weight recently though does believe it is still increasing. We agreed to start slowly tapering down in Risperdal today with a goal of minimizing the weight gain side effect.   Past Psychiatric History: One  prior psychiatric hospitalization to [facility] in February of 2024 due to increased depression/anxiety. No prior suicide attempts.  Patient did endorse passive SI in November of 2023 without any intent or plan.  She denies being connected with a psychiatrist in the past and had recently connected with a therapist at [facility] therapy.  Has taken Effexor (possible restlessness at 225), Cymbalta, Remeron, Ativan, Xanax, Klonopin, gabapentin, BuSpar, Zyprexa (TD), Latuda (akathisia), Risperdal, Doxepin,   Denies substance use other than 1 beer a day. Can increase to 2 when the weather is nice.  Past Medical History:  Past Medical History:  Diagnosis Date   Allergic bronchopulmonary aspergillosis (HCC) 2008   sees Dr Wenda Overland pulmonology   Anemia    iron deficiency, resolved   Anxiety    Asthma    Breast cancer (HCC)    CAD (coronary artery disease)    a. LHC 6/16:  oOM1 61, pRCA 25 >> med Rx b. cath 3/19 2nd OM with 95% stenosis s/p synergy DES & anomalous RCA   CAP (community acquired pneumonia) 2016; 06/07/2016   Chronic bronchitis (HCC)    Chronic lower back pain    Complication of anesthesia    "think I have a hard time waking up from it"   Depression    mild   Diverticulitis    Diverticulosis    GERD (gastroesophageal reflux disease)    H/O hiatal hernia    Headache    "weekly" (08/23/2017)   History of echocardiogram    Echo 6/16:  Mod LVH, EF 60-65%, no RWMA, Gr 1 DD, trivial MR, normal LA size.   History of radiation therapy    Left breast- 12/26/22-01/23/23-Dr. Antony Blackbird   Hyperglycemia 11/20/2015   Hyperlipidemia, mixed 09/11/2007   Qualifier: Diagnosis of  By: Janit Bern   Did not tolerate Lipitor, zocor, Lovastatin, Pravastatin, Livalo, Crestor even low dose    IBS (irritable bowel syndrome)    Maxillary sinusitis    Normal cardiac stress test 11/2011   No evidence of ischemia or infarct.   Calculated ejection fraction 72%.   Obesity    OSA (obstructive sleep apnea) 02/2012   has stopped using  cpap   Osteoarthritis    Osteoporosis    Pneumonia 11/2011   "before 2013 I hadn't had pneumonia since I was a child" (04/13/2012)   Pulmonary nodules    S/P angioplasty with stent 08/23/17 ostial 2nd OM with DES synnergy 08/24/2017   Schatzki's ring     Past Surgical History:  Procedure Laterality Date   ANTERIOR AND POSTERIOR REPAIR N/A 10/18/2022   Procedure: ANTERIOR (CYSTOCELE);  Surgeon: Marcelle Overlie, MD;  Location: [facility] OR;  Service: Gynecology;  Laterality:  N/A;   APPENDECTOMY  1989   BREAST BIOPSY  11/03/2022   MM LT RADIOACTIVE SEED LOC MAMMO GUIDE 11/03/2022 [facility] MAMMOGRAPHY   BREAST LUMPECTOMY WITH RADIOACTIVE SEED LOCALIZATION Left 11/04/2022   Procedure: LEFT BREAST LUMPECTOMY WITH RADIOACTIVE SEED LOCALIZATION;  Surgeon: Griselda Miner, MD;  Location: [facility];  Service: General;  Laterality: Left;   CARDIAC CATHETERIZATION N/A 11/25/2014   Procedure: Right/Left Heart Cath and Coronary Angiography;  Surgeon: Lyn Records, MD;  Location: [facility] INVASIVE CV LAB;  Service: Cardiovascular;  Laterality: N/A;   CESAREAN SECTION  1985   COLONOSCOPY  03/2022   CORONARY ANGIOPLASTY WITH STENT PLACEMENT  08/23/2017   CORONARY STENT INTERVENTION N/A 08/23/2017   Procedure: CORONARY STENT INTERVENTION;  Surgeon: Kathleene Hazel, MD;  Location: [facility] INVASIVE CV LAB;  Service: Cardiovascular;  Laterality: N/A;   HERNIA REPAIR  04/13/2012   VHR laparoscopic   LAPAROSCOPIC BILATERAL SALPINGO OOPHERECTOMY Bilateral 10/18/2022   Procedure: LAPAROTOMY WITH BILATERAL SALPINGO OOPHORECTOMY;  Surgeon: Marcelle Overlie, MD;  Location: [facility]  OR;  Service: Gynecology;  Laterality: Bilateral;   LEFT HEART CATH AND CORONARY ANGIOGRAPHY N/A 08/23/2017   Procedure: LEFT HEART CATH AND CORONARY ANGIOGRAPHY;  Surgeon: Kathleene Hazel, MD;  Location: [facility] INVASIVE CV LAB;  Service: Cardiovascular;  Laterality: N/A;   VAGINAL HYSTERECTOMY N/A 10/18/2022   Procedure: HYSTERECTOMY VAGINAL;  Surgeon: Marcelle Overlie, MD;  Location[facility] OR;  Service: Gynecology;  Laterality: N/A;   VENTRAL HERNIA REPAIR  04/13/2012   Procedure: LAPAROSCOPIC VENTRAL HERNIA;  Surgeon: [name], MD;  Location: [facility] OR;  Service: General;  Laterality: N/A;  laparoscopic repair of incarcerated hernia    Family History:  Family History  Problem Relation Age of Onset   Breast cancer Mother 78       bilateral breast cancer dx. 60, met to liver   Hypertension Mother    Diabetes Mother    Diverticulosis Father    Prostate cancer Father    Breast cancer Sister 50       DCIS at 53, IDC at 60, PALB2+   Cancer Sister        breast cancer, invasive ductal carcinoma in 2022,DCIS at 67 with 4 weeks of radiation, 5 years of Tamoxifen    Pulmonary embolism Brother        recurrent   Heart attack Maternal Grandfather    Ovarian cancer Paternal Grandmother    Cerebral palsy Son    Prostate cancer Paternal Uncle    Breast cancer Niece 52       PALB2+   Osteoporosis Niece    Stroke Neg Hx    Colon cancer Neg Hx    Esophageal cancer Neg Hx    Stomach cancer Neg Hx    Rectal cancer Neg Hx     Social History:  Social History   Socioeconomic History   Marital status: Married    Spouse name: Not on file   Number of children: 1   Years of education: 14   Highest education level: Associate degree: academic program  Occupational History   Occupation: Disabled   Tobacco Use   Smoking status: Never    Passive exposure: Never   Smokeless tobacco: Never  Vaping Use   Vaping status: Never Used  Substance and Sexual Activity   Alcohol use: Yes     Alcohol/week: 4.0 - 7.0 standard drinks of alcohol    Types: 4 - 7 Standard drinks or equivalent per week  Drug use: No   Sexual activity: Yes    Birth control/protection: Surgical    Comment: gluten free, lives with husband and son with CP quadriplegia  Other Topics Concern   Not on file  Social History Narrative   Cares for son with cerebral palsy.    Lives at home with her husband and son.   Right-handed.   2 cups caffeine per day.   One story home   Social Determinants of Health   Financial Resource Strain: Low Risk  (01/18/2023)   Overall Financial Resource Strain (CARDIA)    Difficulty of Paying Living Expenses: Not hard at all  Food Insecurity: No Food Insecurity (01/18/2023)   Hunger Vital Sign    Worried About Running Out of Food in the Last Year: Never true    Ran Out of Food in the Last Year: Never true  Transportation Needs: No Transportation Needs (10/24/2022)   PRAPARE - Administrator, Civil Service (Medical): No    Lack of Transportation (Non-Medical): No  Physical Activity: Inactive (02/10/2023)   Exercise Vital Sign    Days of Exercise per Week: 0 days    Minutes of Exercise per Session: 0 min  Stress: Stress Concern Present (01/18/2023)   [employer] of Occupational Health - Occupational Stress Questionnaire    Feeling of Stress : To some extent  Social Connections: Unknown (01/18/2023)   Social Connection and Isolation Panel [NHANES]    Frequency of Communication with Friends and Family: Three times a week    Frequency of Social Gatherings with Friends and Family: Once a week    Attends Religious Services: More than 4 times per year    Active Member of [employer] or Organizations: Not on file    Attends Banker Meetings: Not on file    Marital Status: Married    Allergies:  Allergies  Allergen Reactions   Latuda [Lurasidone Hcl] Other (See Comments)    PER THE PT CAUSED RESTLESSNESS   Beclomethasone Dipropionate Hives and Other  (See Comments)     weight gain   Flexeril [Cyclobenzaprine] Anxiety   Mometasone Furo-Formoterol Fum Hives and Other (See Comments)    weight gain   Sulfonamide Derivatives Hives and Rash   Statins Other (See Comments)    Myalgias, RLS    Current Medications: Current Outpatient Medications  Medication Sig Dispense Refill   acetaminophen (TYLENOL) 650 MG CR tablet Take 650 mg by mouth every 8 (eight) hours as needed for pain.     albuterol (VENTOLIN HFA) 108 (90 Base) MCG/ACT inhaler Inhale 1-2 puffs into the lungs every 6 (six) hours as needed. 8 g 2   ascorbic acid (VITAMIN C) 500 MG tablet Take 500 mg by mouth daily.     aspirin EC 81 MG tablet Take 81 mg by mouth daily. Swallow whole.     Benralizumab (FASENRA PEN) 30 MG/ML SOAJ Inject 1 mL (30 mg total) into the skin every 8 (eight) weeks. 1 mL 2   budesonide (ENTOCORT EC) 3 MG 24 hr capsule Take 3 mg by mouth daily.     Calcium-Magnesium-Vitamin D (CALCIUM MAGNESIUM PO) Take 1 tablet by mouth at bedtime.     Cholecalciferol (VITAMIN D3 ULTRA STRENGTH) 125 MCG (5000 UT) capsule Take 5,000 Units by mouth daily.     clonazePAM (KLONOPIN) 0.5 MG tablet Take 1 tablet (0.5 mg total) by mouth daily. 30 tablet 2   denosumab (PROLIA) 60 MG/ML SOSY injection Inject 60 mg into the  skin every 6 (six) months.     doxepin (SINEQUAN) 25 MG capsule Take 1 capsule (25 mg total) by mouth at bedtime. 30 capsule 3   Evolocumab (REPATHA SURECLICK) 140 MG/ML SOAJ Inject 140 mg into the skin every 14 (fourteen) days. 2 mL 11   famotidine (PEPCID) 20 MG tablet TAKE 1 TABLET BY MOUTH TWICE A DAY 60 tablet 11   ibuprofen (ADVIL) 200 MG tablet Take 400 mg by mouth every 6 (six) hours as needed for moderate pain.     ipratropium-albuterol (DUONEB) 0.5-2.5 (3) MG/3ML SOLN Take 3 mLs by nebulization every 6 (six) hours as needed. 120 mL 5   nitroGLYCERIN (NITROSTAT) 0.4 MG SL tablet Place 1 tablet (0.4 mg total) under the tongue every 5 (five) minutes as needed  for chest pain. 25 tablet 11   oxyCODONE (ROXICODONE) 5 MG immediate release tablet Take 1 tablet (5 mg total) by mouth every 6 (six) hours as needed for severe pain. 10 tablet 0   pindolol (VISKEN) 5 MG tablet Take 1 tablet (5 mg total) by mouth daily after breakfast. 30 tablet 3   risperiDONE (RISPERDAL) 0.5 MG tablet Take 1 tablet (0.5 mg total) by mouth daily AND 0.5 tablets (0.25 mg total) every evening. Take 1 tablet (0.5 mg) in the morning and half a tablet (0.25 mg) at 6-7pm. 45 tablet 2   tamoxifen (NOLVADEX) 20 MG tablet Take 1 tablet (20 mg total) by mouth daily. 30 tablet 3   venlafaxine XR (EFFEXOR XR) 75 MG 24 hr capsule Take 3 capsules (225 mg total) by mouth daily with breakfast. 90 capsule 1   No current facility-administered medications for [name] visit.     Psychiatric Specialty Exam: Review of Systems  There were no vitals taken for [name] visit.There is no height or weight on file to calculate BMI.  General Appearance: Well Groomed  Eye Contact:  Good  Speech:  Clear and Coherent and Normal Rate  Volume:  Normal  Mood:  Euthymic  Affect:  Congruent  Thought Process:  Coherent and Goal Directed  Orientation:  Full (Time, Place, and Person)  Thought Content: Logical   Suicidal Thoughts:  No  Homicidal Thoughts:  No  Memory:  NA  Judgement:  Good  Insight:  Fair  Psychomotor Activity:  Restlessness and primarily in the morning  Concentration:  Concentration: Good  Recall:  Fair  Fund of Knowledge: Fair  Language: Good  Akathisia:  No    AIMS (if indicated): done  Assets:  Communication Skills Desire for Improvement Housing Intimacy Social Support Transportation Vocational/Educational  ADL's:  Intact  Cognition: WNL  Sleep:  Poor   Metabolic Disorder Labs: Lab Results  Component Value Date   HGBA1C 5.4 01/19/2023   MPG 117 10/22/2008   Lab Results  Component Value Date   PROLACTIN 15.4 08/12/2022   Lab Results  Component Value Date   CHOL 207  (H) 01/19/2023   TRIG 170.0 (H) 01/19/2023   HDL 62.70 01/19/2023   CHOLHDL 3 01/19/2023   VLDL 34.0 01/19/2023   LDLCALC 111 (H) 01/19/2023   LDLCALC 111 (H) 05/26/2022   Lab Results  Component Value Date   TSH 1.76 01/19/2023   TSH 1.788 08/12/2022    Therapeutic Level Labs: No results found for: "LITHIUM" No results found for: "VALPROATE" No results found for: "CBMZ"   Screenings: AIMS    Flowsheet Row Admission (Discharged) from 08/12/2022 in [facility]  AIMS Total Score 2  AUDIT    Flowsheet Row Admission (Discharged) from 08/12/2022 in [facility]  Alcohol Use Disorder Identification Test Final Score (AUDIT) 0      GAD-7    Flowsheet Row Video Visit from 10/13/2022 in [facility] Office Visit from 06/30/2022 [facility] Office Visit from 05/26/2022 in [facility] at [facility] Office Visit from 01/16/2017 in [facility] at [facility]  Total GAD-7 Score 0 11 11 3       PHQ2-9    Flowsheet Row Clinical Support from 02/10/2023 in [facility] Office Visit from 01/19/2023 in [facility] at [facility] Video Visit from 10/13/2022 in [facility] Telephone from 08/19/2022 in [facility] Office Visit from 06/30/2022 in [facility]  PHQ-2 Total Score 0 0 0 2 3  PHQ-9 Total Score -- -- 0 -- 4      Flowsheet Row Admission (Discharged) from 11/04/2022 in [facility] Admission (Discharged) from 10/18/2022 in [facility] Pre-Admission Testing 60 from 10/13/2022 in [facility] PREADMISSION TESTING  C-SSRS RISK CATEGORY No Risk No Risk No Risk       Collaboration of Care:  Collaboration of Care: Medication Management AEB medication prescription, Primary Care Provider AEB chart review, and Other provider involved in patient's care AEB oncology chart review  Patient/Guardian was advised Release of Information must be obtained prior to any record release in order to collaborate their care with an outside provider. Patient/Guardian was advised if they have not already done so to contact the registration department to sign all necessary forms in order for [name] to release information regarding their care.   Consent: Patient/Guardian gives verbal consent for treatment and assignment of benefits for services provided during [name] visit. Patient/Guardian expressed understanding and agreed to proceed.    Stasia Cavalier, MD 03/24/2023, 12:55 PM   Virtual Visit via Video Note  I connected with Victoria [name] on 03/24/23 at 11:00 AM EDT by a video enabled telemedicine application and verified that I am speaking with the correct person using two identifiers.  Location: Patient: Home Provider: Home Office   I discussed the limitations of evaluation and management by telemedicine and the availability of in person appointments. The patient expressed understanding and agreed to proceed.   I discussed the assessment and treatment plan with the patient. The patient was provided an opportunity to ask questions and all were answered. The patient agreed with the plan and demonstrated an understanding of the instructions.   The patient was advised to call back or seek an in-person evaluation if the symptoms worsen or if the condition fails to improve as anticipated.  I provided 25 minutes of non-face-to-face time during [name] encounter.   Stasia Cavalier, MD

## 2023-03-27 ENCOUNTER — Other Ambulatory Visit: Payer: Self-pay | Admitting: Internal Medicine

## 2023-03-27 ENCOUNTER — Telehealth: Payer: Self-pay | Admitting: Nurse Practitioner

## 2023-03-28 ENCOUNTER — Encounter: Payer: Self-pay | Admitting: *Deleted

## 2023-04-03 ENCOUNTER — Encounter: Payer: PPO | Admitting: Nurse Practitioner

## 2023-04-04 DIAGNOSIS — M542 Cervicalgia: Secondary | ICD-10-CM | POA: Diagnosis not present

## 2023-04-04 DIAGNOSIS — M4120 Other idiopathic scoliosis, site unspecified: Secondary | ICD-10-CM | POA: Diagnosis not present

## 2023-04-04 DIAGNOSIS — M545 Low back pain, unspecified: Secondary | ICD-10-CM | POA: Diagnosis not present

## 2023-04-06 ENCOUNTER — Other Ambulatory Visit: Payer: Self-pay | Admitting: Internal Medicine

## 2023-04-06 DIAGNOSIS — M5459 Other low back pain: Secondary | ICD-10-CM | POA: Diagnosis not present

## 2023-04-06 DIAGNOSIS — M4125 Other idiopathic scoliosis, thoracolumbar region: Secondary | ICD-10-CM | POA: Diagnosis not present

## 2023-04-10 ENCOUNTER — Ambulatory Visit
Admission: RE | Admit: 2023-04-10 | Discharge: 2023-04-10 | Disposition: A | Discharge status: Home / Self Care | Payer: PPO | Source: Ambulatory Visit | Attending: Obstetrics and Gynecology | Admitting: Obstetrics and Gynecology

## 2023-04-10 DIAGNOSIS — Z853 Personal history of malignant neoplasm of breast: Secondary | ICD-10-CM | POA: Diagnosis not present

## 2023-04-10 HISTORY — DX: Personal history of irradiation: Z92.3

## 2023-04-12 ENCOUNTER — Encounter (HOSPITAL_COMMUNITY): Payer: Self-pay

## 2023-04-12 ENCOUNTER — Telehealth: Payer: Self-pay | Admitting: Pharmacist

## 2023-04-12 NOTE — Telephone Encounter (Signed)
Received fax from [employer] that patient is conditionally enrolled into [place] patient assistance program for 2025. A re-enrollment application will need to be completed as well as proof of denial or fund closure from 3 independent charitable foundations (time stamped on or after 06/21/2023)  Patient portion mailed today to home today. Provider portion placed in Dr. Jane Canary mailbox for signature-  Chesley Mires, PharmD, MPH, BCPS, CPP Clinical Pharmacist (Rheumatology and Pulmonology)

## 2023-04-13 DIAGNOSIS — M542 Cervicalgia: Secondary | ICD-10-CM | POA: Diagnosis not present

## 2023-04-13 DIAGNOSIS — M4120 Other idiopathic scoliosis, site unspecified: Secondary | ICD-10-CM | POA: Diagnosis not present

## 2023-04-13 DIAGNOSIS — M545 Low back pain, unspecified: Secondary | ICD-10-CM | POA: Diagnosis not present

## 2023-04-16 NOTE — Progress Notes (Signed)
Cardiology Office Note:   Date:  04/17/2023  ID:  Victoria, [name] [birth year], MRN [id number] PCP: Bradd Canary, MD  [facility] Providers Cardiologist:  Rollene Rotunda, MD {  History of Present Illness:   Victoria [name] is a 67 y.o. female who was seen by Dr. Shari Prows.  She has a history of CAD s/p DES-OM2 in 2019, dilation of ascending aorta, PSVT, hyperlipidemia, iron deficiency anemia, asthma, and OSA.   She is status post  DES-OM2, otherwise nonobstructive disease in the RCA and LAD.  Myoview in 2021 was low risk.  Echocardiogram in 2020 showed EF 60 to 65%, mildly increased posterior wall thickness, mild LVH, G1 DD, normal RV SF, trivial TR, mild AI, dilated aorta, 36 mm.  CT chest/aorta in 2022 showed no evidence of aneurysm.  Cardiac monitor in 04/2022 showed predominantly sinus rhythm, 6 runs of SVT, rare ectopy, no sustained arrhythmia.  She has a history of statin intolerance and is on Repatha.  She was last seen in office on August 2024  and was stable from a cardiac standpoint.  Nuclear stress test in 09/2022 in the setting of chest pain was low risk.  She saw her PCP on 01/19/2023 and noted increased dyspnea. Repeat echocardiogram was ordered and demonstrated an EF of 55 - 60% with mild aortic calcification.    At the last visit she had some chest discomfort which is a stable pattern that usually goes away when she drinks some water.  However, she was at church and did not have the water so she took 2 nitroglycerin and then got very woozy.  She had presyncope but did not actually lose consciousness.  She since then has not had any new chest discomfort, neck or arm discomfort.  She gets this reflux-like symptoms periodically.  She is able to be somewhat active though limited by back pain.  She does some chores and some physical therapy like exercises.  She does not bring on any chest pressure, neck or arm discomfort.  She does not bring on any new shortness of breath, PND or  orthopnea.  She has not had any palpitations, presyncope or syncope.  ROS: As stated in the HPI and negative for all other systems.  Studies Reviewed:    EKG:  NA  Risk Assessment/Calculations:         Physical Exam:   VS:  BP 108/76 (BP Location: Left Arm, Patient Position: Sitting, Cuff Size: Normal)   Pulse 74   Ht 5\' 1"  (1.549 m)   Wt 167 lb 6.4 oz (75.9 kg)   SpO2 97%   BMI 31.63 kg/m    Wt Readings from Last 3 Encounters:  04/17/23 167 lb 6.4 oz (75.9 kg)  02/27/23 166 lb 4 oz (75.4 kg)  02/01/23 165 lb 9.6 oz (75.1 kg)     GEN: Well nourished, well developed in no acute distress NECK: No JVD; No carotid bruits CARDIAC: RRR, no murmurs, rubs, gallops RESPIRATORY:  Clear to auscultation without rales, wheezing or rhonchi  ABDOMEN: Soft, non-tender, non-distended EXTREMITIES:  No edema; No deformity   ASSESSMENT AND PLAN:   CAD/Chest pain/dyspnea:    She has no new symptoms.  She will participate aggressively in secondary risk reduction.    Dilation of ascending aorta:    This was thought to be normal size on echo in August 2024.   No further imaging at this time.  PSVT: She did wear a monitor in 05/09/2022 and had sinus tachycardia  and rare SVT.  No change in therapy.  Hyperlipidemia: LDL was was not quite at target.  She restarted Repatha since she was last seen.  She is taking about every 3 weeks.  I would like to check a lipid profile.   OSA:   She has not used CPAP and does not want this.     Follow up with me in 1 year  Signed, Rollene Rotunda, MD

## 2023-04-17 ENCOUNTER — Ambulatory Visit: Payer: PPO | Attending: Cardiology | Admitting: Cardiology

## 2023-04-17 ENCOUNTER — Encounter: Payer: Self-pay | Admitting: Cardiology

## 2023-04-17 VITALS — BP 108/76 | HR 74 | Ht 61.0 in | Wt 167.4 lb

## 2023-04-17 DIAGNOSIS — I77810 Thoracic aortic ectasia: Secondary | ICD-10-CM | POA: Diagnosis not present

## 2023-04-17 DIAGNOSIS — I471 Supraventricular tachycardia, unspecified: Secondary | ICD-10-CM | POA: Diagnosis not present

## 2023-04-17 DIAGNOSIS — R0602 Shortness of breath: Secondary | ICD-10-CM | POA: Diagnosis not present

## 2023-04-17 DIAGNOSIS — E785 Hyperlipidemia, unspecified: Secondary | ICD-10-CM

## 2023-04-17 DIAGNOSIS — I25118 Atherosclerotic heart disease of native coronary artery with other forms of angina pectoris: Secondary | ICD-10-CM

## 2023-04-17 NOTE — Patient Instructions (Signed)
Medication Instructions:   No changes *If you need a refill on your cardiac medications before your next appointment, please call your pharmacy*   Lab Work: Lipid - fasting If you have labs (blood work) drawn today and your tests are completely normal, you will receive your results only by: MyChart Message (if you have MyChart) OR A paper copy in the mail If you have any lab test that is abnormal or we need to change your treatment, we will call you to review the results.   Testing/Procedures:  Not needed  Follow-Up: At [facility], you and your health needs are our priority.  As part of our continuing mission to provide you with exceptional heart care, we have created designated Provider Care Teams.  These Care Teams include your primary Cardiologist (physician) and Advanced Practice Providers (APPs -  Physician Assistants and Nurse Practitioners) who all work together to provide you with the care you need, when you need it.     Your next appointment:   12 month(s)  The format for your next appointment:   In Person  Provider:   Rollene Rotunda, MD

## 2023-04-18 DIAGNOSIS — M545 Low back pain, unspecified: Secondary | ICD-10-CM | POA: Diagnosis not present

## 2023-04-18 DIAGNOSIS — M542 Cervicalgia: Secondary | ICD-10-CM | POA: Diagnosis not present

## 2023-04-18 DIAGNOSIS — M4120 Other idiopathic scoliosis, site unspecified: Secondary | ICD-10-CM | POA: Diagnosis not present

## 2023-04-24 NOTE — Assessment & Plan Note (Signed)
Overall stable on current meds and managing her new diagnosis well. Continues to be under a great deal of stress

## 2023-04-24 NOTE — Assessment & Plan Note (Signed)
Followint with pulmonary

## 2023-04-24 NOTE — Assessment & Plan Note (Addendum)
Encouraged to get adequate exercise, calcium and vitamin d intake. Dexa scan recently completed with [facility]. Reviewed to her 2022 scan her left femur improved from -1.5 to -1.1 and right femur improved from 2.3 to 1.5  the forearm is unchanged at 2.8 her surgeon wants her to consider changing her meds from Prolia, will refer to endocrinology for further consideration

## 2023-04-24 NOTE — Assessment & Plan Note (Signed)
Does not tolerate statins.

## 2023-04-24 NOTE — Assessment & Plan Note (Signed)
Not interested in CPAP at this time.

## 2023-04-24 NOTE — Assessment & Plan Note (Signed)
hgba1c acceptable, minimize simple carbs. Increase exercise as tolerated. Consider Prevnar 20, Arexvy and covid and flu boosters annually

## 2023-04-24 NOTE — Progress Notes (Signed)
BH MD/PA/NP OP Progress Note  04/28/2023 9:23 AM Victoria [name]  MRN:  [id number]  Visit Diagnosis:    ICD-10-CM   1. Anxiety and depression  F41.9 clonazePAM (KLONOPIN) 0.5 MG tablet   F32.A pindolol (VISKEN) 5 MG tablet    risperiDONE (RISPERDAL) 0.5 MG tablet       Assessment: Victoria [name] is a 67 y.o. female with a history of anxiety, depression, OSA who presented to [name] for initial evaluation on 06/30/2022.  At initial evaluation patient reported symptoms of anxiety including feeling nervous or on edge, being unable to stop controlled worrying, worrying too much about different things, and fear that something awful would happen.  Patient's symptoms have been gradually progressing.  There was a period in November 2023 where she was having passive SI without intent or plan.  Patient also did endorse low mood, poor sleep, difficulty concentrating.  She does have a history of obstructive sleep apnea and does not use CPAP with last sleep study being over 10 years ago. Psychosocially patient has increased stress of caring for her adult son with cerebral palsy and planning the next steps for him after she and her husband pass or are no longer able to care for him.  Patient met criteria for MDD and generalized anxiety disorder. Of note on exam patient was found to have smacking of her lips repetitive movements of her mouth which are concerning for parkinsonism secondary to Zyprexa.    Victoria [name] presents for follow-up evaluation. Today, 04/28/23, patient reports being unable to tolerate the decrease in Risperdal due to worsening anxiety, restlessness, insomnia, and negative intrusive thoughts.  This lasts around a week until she restarted the Risperdal at 0.5 twice daily.  Of note there was some residual restlessness in the mornings and patient adjusted the timing of the Klonopin and pindolol dosing from breakfast to when she first wakes up in the morning.   At this time we would recommend continuing on this regimen.  As weight loss is still a concern we discussed some strategies to help manage that.  Patient had also talked to her PCP about [facility] and we reviewed the risk and benefits of this medication from a psychiatric standpoint.  Would be appropriate to give this a trial from a psychiatric standpoint if PCP is in agreement and we can monitor for any change in mood symptoms.  Plan: - Continue Effexor XR 225 mg QD - Continue Risperdal 0.5 mg BID  - Continue Doxepin 25 mg QHS - Continue klonopin 0.5 mg QD prn for anxiety - Continue pindolol 5 mg QD - Recommend repeat sleep study, could consider dental devices OSA still present - CMP, CBC, lipid profile, Vit D, A1c, TSH reviewed - Continue with therapy every week through [place] therapy - Completed a 10 week wellness group through the [facility], followed by a group for special needs parents - Discussed other support groups - Crisis resources reviewed - Follow up in a month  Chief Complaint:  Chief Complaint  Patient presents with   Follow-up   HPI: Victoria [name] presents reporting that she had some difficulty in the interim.  Following her last appointment she went on the planned vacation which was fun and upon returning decreased the Risperdal as we had discussed.  Shortly after decreasing medication however she found herself getting more anxious, restless, insomnia, and experiencing intrusive thoughts of going to negative places.  She had tried to use her coping mechanism without much  benefit.  After a few days she restarted the Risperdal at 0.5 mg twice a day and noted that the symptoms began to improve.  She still experienced some restlessness in the mornings and has changed taking the pindolol and Klonopin to first thing in the morning to see if that helps.  Patient had just made these time adjustment earlier this week and is unable to tell if there is been benefit yet. While Victoria [name] feels that the medication  may be necessary at this time she did express some frustration as she had about 17 pounds of weight gain since her hospitalization.  We discussed this experience and based off the results how she may need to continue on the Risperdal at this time.  In regards to weight gain we recommended attempting to focus on diet and exercise as able.  Patient has also reached out to her primary care about weight loss medication sociability.  Reviewed the impact this could have on absorption of other antidepressant medications but otherwise no significant concerns.  Encouraged her discuss with her PCP on whether that would be a viable option.  If so we can monitor for any apparent decrease in efficacy of antidepressant medications.  Past Psychiatric History: One  prior psychiatric hospitalization to [facility] in February of 2024 due to increased depression/anxiety. No prior suicide attempts.  Patient did endorse passive SI in November of 2023 without any intent or plan.  She denies being connected with a psychiatrist in the past and had recently connected with a therapist at [facility] therapy.  Has taken Effexor (possible restlessness at 225), Cymbalta, Remeron, Ativan, Xanax, Klonopin, gabapentin, BuSpar, Zyprexa (TD), Latuda (akathisia), Risperdal, Doxepin,   Denies substance use other than 1 beer a day. Can increase to 2 when the weather is nice.  Past Medical History:  Past Medical History:  Diagnosis Date   Allergic bronchopulmonary aspergillosis (HCC) 2008   sees Dr Wenda Overland pulmonology   Anemia    iron deficiency, resolved   Anxiety    Asthma    Breast cancer (HCC)    CAD (coronary artery disease)    a. LHC 6/16:  oOM1 61, pRCA 25 >> med Rx b. cath 3/19 2nd OM with 95% stenosis s/p synergy DES & anomalous RCA   CAP (community acquired pneumonia) 2016; 06/07/2016   Chronic bronchitis (HCC)    Chronic lower back pain    Complication of anesthesia    "think I have a hard time waking up from it"    Depression    mild   Diverticulitis    Diverticulosis    GERD (gastroesophageal reflux disease)    H/O hiatal hernia    Headache    "weekly" (08/23/2017)   History of echocardiogram    Echo 6/16:  Mod LVH, EF 60-65%, no RWMA, Gr 1 DD, trivial MR, normal LA size.   History of radiation therapy    Left breast- 12/26/22-01/23/23-Dr. Antony Blackbird   Hyperglycemia 11/20/2015   Hyperlipidemia, mixed 09/11/2007   Qualifier: Diagnosis of  By: Janit Bern   Did not tolerate Lipitor, zocor, Lovastatin, Pravastatin, Livalo, Crestor even low dose    IBS (irritable bowel syndrome)    Maxillary sinusitis    Normal cardiac stress test 11/2011   No evidence of ischemia or infarct.   Calculated ejection fraction 72%.   Obesity    OSA (obstructive sleep apnea) 02/2012   has stopped using  cpap   Osteoarthritis    Osteoporosis    Personal history  of radiation therapy    Pneumonia 11/2011   "before 2013 I hadn't had pneumonia since I was a child" (04/13/2012)   Pulmonary nodules    S/P angioplasty with stent 08/23/17 ostial 2nd OM with DES synnergy 08/24/2017   Schatzki's ring     Past Surgical History:  Procedure Laterality Date   ANTERIOR AND POSTERIOR REPAIR N/A 10/18/2022   Procedure: ANTERIOR (CYSTOCELE);  Surgeon: Marcelle Overlie, MD;  Location: [facility] OR;  Service: Gynecology;  Laterality: N/A;   APPENDECTOMY  1989   BREAST BIOPSY  11/03/2022   MM LT RADIOACTIVE SEED LOC MAMMO GUIDE 11/03/2022 [facility] MAMMOGRAPHY   BREAST LUMPECTOMY WITH RADIOACTIVE SEED LOCALIZATION Left 11/04/2022   Procedure: LEFT BREAST LUMPECTOMY WITH RADIOACTIVE SEED LOCALIZATION;  Surgeon: Griselda Miner, MD;  Location: [facility];  Service: General;  Laterality: Left;   CARDIAC CATHETERIZATION N/A 11/25/2014   Procedure: Right/Left Heart Cath and Coronary Angiography;  Surgeon: Lyn Records, MD;  Location: [facility] INVASIVE CV LAB;  Service: Cardiovascular;  Laterality: N/A;   CESAREAN SECTION  1985    COLONOSCOPY  03/2022   CORONARY ANGIOPLASTY WITH STENT PLACEMENT  08/23/2017   CORONARY STENT INTERVENTION N/A 08/23/2017   Procedure: CORONARY STENT INTERVENTION;  Surgeon: Kathleene Hazel, MD;  Location: [facility] INVASIVE CV LAB;  Service: Cardiovascular;  Laterality: N/A;   HERNIA REPAIR  04/13/2012   VHR laparoscopic   LAPAROSCOPIC BILATERAL SALPINGO OOPHERECTOMY Bilateral 10/18/2022   Procedure: LAPAROTOMY WITH BILATERAL SALPINGO OOPHORECTOMY;  Surgeon: Marcelle Overlie, MD;  Location: [facility] OR;  Service: Gynecology;  Laterality: Bilateral;   LEFT HEART CATH AND CORONARY ANGIOGRAPHY N/A 08/23/2017   Procedure: LEFT HEART CATH AND CORONARY ANGIOGRAPHY;  Surgeon: Kathleene Hazel, MD;  Location: [facility] INVASIVE CV LAB;  Service: Cardiovascular;  Laterality: N/A;   VAGINAL HYSTERECTOMY N/A 10/18/2022   Procedure: HYSTERECTOMY VAGINAL;  Surgeon: Marcelle Overlie, MD;  Location[facility] OR;  Service: Gynecology;  Laterality: N/A;   VENTRAL HERNIA REPAIR  04/13/2012   Procedure: LAPAROSCOPIC VENTRAL HERNIA;  Surgeon: [name], MD;  Location: [facility] OR;  Service: General;  Laterality: N/A;  laparoscopic repair of incarcerated hernia    Family History:  Family History  Problem Relation Age of Onset   Breast cancer Mother 15       bilateral breast cancer dx. 38, met to liver   Hypertension Mother    Diabetes Mother    Diverticulosis Father    Prostate cancer Father    Breast cancer Sister 60       DCIS at 19, IDC at 38, PALB2+   Cancer Sister        breast cancer, invasive ductal carcinoma in 2022,DCIS at 38 with 4 weeks of radiation, 5 years of Tamoxifen    Pulmonary embolism Brother        recurrent   Heart attack Maternal Grandfather    Ovarian cancer Paternal Grandmother    Cerebral palsy Son    Prostate cancer Paternal Uncle    Breast cancer Niece 45       PALB2+   Osteoporosis Niece    Stroke Neg Hx    Colon cancer Neg Hx    Esophageal cancer Neg Hx    Stomach cancer Neg Hx     Rectal cancer Neg Hx     Social History:  Social History   Socioeconomic History   Marital status: Married    Spouse name: Not on file   Number of children: 1   Years  of education: 14   Highest education level: Associate degree: academic program  Occupational History   Occupation: Disabled   Tobacco Use   Smoking status: Never    Passive exposure: Never   Smokeless tobacco: Never  Vaping Use   Vaping status: Never Used  Substance and Sexual Activity   Alcohol use: Yes    Alcohol/week: 4.0 - 7.0 standard drinks of alcohol    Types: 4 - 7 Standard drinks or equivalent per week   Drug use: No   Sexual activity: Yes    Birth control/protection: Surgical    Comment: gluten free, lives with husband and son with CP quadriplegia  Other Topics Concern   Not on file  Social History Narrative   Cares for son with cerebral palsy.    Lives at home with her husband and son.   Right-handed.   2 cups caffeine per day.   One story home   Social Determinants of [name]   [name] Resource Strain: Low Risk  (01/18/2023)   Overall [name] Resource Strain (CARDIA)    Difficulty of Paying Living Expenses: Not hard at all  Food Insecurity: No Food Insecurity (01/18/2023)   Hunger Vital Sign    Worried About Running Out of Food in the Last Year: Never true    Ran Out of Food in the Last Year: Never true  Transportation Needs: No Transportation Needs (10/24/2022)   PRAPARE - Administrator, Civil Service (Medical): No    Lack of Transportation (Non-Medical): No  Physical Activity: Inactive (02/10/2023)   Exercise Vital Sign    Days of Exercise per Week: 0 days    Minutes of Exercise per Session: 0 min  Stress: Stress Concern Present (01/18/2023)   Victoria [name] - Occupational Stress Questionnaire    Feeling of Stress : To some extent  Social Connections: Unknown (01/18/2023)   Social Connection and Isolation Panel [NHANES]    Frequency of  Communication with Friends and Family: Three times a week    Frequency of Social Gatherings with Friends and Family: Once a week    Attends Religious Services: More than 4 times per year    Active Member of Victoria [name] or Organizations: Not on file    Attends Banker Meetings: Not on file    Marital Status: Married    Allergies:  Allergies  Allergen Reactions   Latuda [Lurasidone Hcl] Other (See Comments)    PER THE PT CAUSED RESTLESSNESS   Beclomethasone Dipropionate Hives and Other (See Comments)     weight gain   Flexeril [Cyclobenzaprine] Anxiety   Mometasone Furo-Formoterol Fum Hives and Other (See Comments)    weight gain   Sulfonamide Derivatives Hives and Rash   Statins Other (See Comments)    Myalgias, RLS    Current Medications: Current Outpatient Medications  Medication Sig Dispense Refill   acetaminophen (TYLENOL) 650 MG CR tablet Take 650 mg by mouth every 8 (eight) hours as needed for pain.     albuterol (VENTOLIN HFA) 108 (90 Base) MCG/ACT inhaler Inhale 1-2 puffs into the lungs every 6 (six) hours as needed. 8 g 2   ascorbic acid (VITAMIN C) 500 MG tablet Take 500 mg by mouth daily.     aspirin EC 81 MG tablet Take 81 mg by mouth daily. Swallow whole.     Benralizumab (FASENRA PEN) 30 MG/ML SOAJ Inject 1 mL (30 mg total) into the skin every 8 (eight) weeks. 1 mL 2  budesonide (ENTOCORT EC) 3 MG 24 hr capsule TAKE 1 CAPSULE (3 MG TOTAL) BY MOUTH DAILY. 30 capsule 1   Calcium-Magnesium-Vitamin D (CALCIUM MAGNESIUM PO) Take 1 tablet by mouth at bedtime.     Cholecalciferol (VITAMIN D3 ULTRA STRENGTH) 125 MCG (5000 UT) capsule Take 5,000 Units by mouth daily.     [START ON 05/04/2023] clonazePAM (KLONOPIN) 0.5 MG tablet Take 1 tablet (0.5 mg total) by mouth daily. 30 tablet 2   denosumab (PROLIA) 60 MG/ML SOSY injection Inject 60 mg into the skin every 6 (six) months.     doxepin (SINEQUAN) 25 MG capsule Take 1 capsule (25 mg total) by mouth at bedtime. 30  capsule 3   Evolocumab (REPATHA SURECLICK) 140 MG/ML SOAJ Inject 140 mg into the skin every 14 (fourteen) days. 2 mL 11   famotidine (PEPCID) 20 MG tablet TAKE 1 TABLET BY MOUTH TWICE A DAY 60 tablet 11   ibuprofen (ADVIL) 200 MG tablet Take 400 mg by mouth every 6 (six) hours as needed for moderate pain.     ipratropium-albuterol (DUONEB) 0.5-2.5 (3) MG/3ML SOLN Take 3 mLs by nebulization every 6 (six) hours as needed. 120 mL 5   nitroGLYCERIN (NITROSTAT) 0.4 MG SL tablet Place 1 tablet (0.4 mg total) under the tongue every 5 (five) minutes as needed for chest pain. 25 tablet 11   oxyCODONE (ROXICODONE) 5 MG immediate release tablet Take 1 tablet (5 mg total) by mouth every 6 (six) hours as needed for severe pain. 10 tablet 0   pindolol (VISKEN) 5 MG tablet Take 1 tablet (5 mg total) by mouth daily after breakfast. 30 tablet 3   risperiDONE (RISPERDAL) 0.5 MG tablet Take 1 tablet (0.5 mg total) by mouth 2 (two) times daily. Take 1 tablet (0.5 mg) in the morning and half a tablet (0.25 mg) at 6-7pm 60 tablet 2   tamoxifen (NOLVADEX) 20 MG tablet Take 1 tablet (20 mg total) by mouth daily. 30 tablet 3   venlafaxine XR (EFFEXOR XR) 75 MG 24 hr capsule Take 3 capsules (225 mg total) by mouth daily with breakfast. 90 capsule 1   Current Facility-Administered Medications  Medication Dose Route Frequency Provider Last Rate Last Admin   [START ON 05/10/2023] denosumab (PROLIA) injection 60 mg  60 mg Subcutaneous Once Victoria Canary, MD         Psychiatric Specialty Exam: Review of Systems  There were no vitals taken for this visit.There is no height or weight on file to calculate BMI.  General Appearance: Well Groomed  Eye Contact:  Good  Speech:  Clear and Coherent and Normal Rate  Volume:  Normal  Mood:  Euthymic  Affect:  Congruent  Thought Process:  Coherent and Goal Directed  Orientation:  Full (Time, Place, and Person)  Thought Content: Logical   Suicidal Thoughts:  No  Homicidal  Thoughts:  No  Memory:  NA  Judgement:  Good  Insight:  Fair  Psychomotor Activity:  Restlessness and primarily in the morning  Concentration:  Concentration: Good  Recall:  Fair  Fund of Knowledge: Fair  Language: Good  Akathisia:  No    AIMS (if indicated): done  Assets:  Communication Skills Desire for Improvement Housing Intimacy Social Support Transportation Vocational/Educational  ADL's:  Intact  Cognition: WNL  Sleep:  Poor   Metabolic Disorder Labs: Lab Results  Component Value Date   HGBA1C 5.3 04/25/2023   MPG 117 10/22/2008   Lab Results  Component Value Date  PROLACTIN 15.4 08/12/2022   Lab Results  Component Value Date   CHOL 126 04/25/2023   TRIG 125.0 04/25/2023   HDL 63.20 04/25/2023   CHOLHDL 2 04/25/2023   VLDL 25.0 04/25/2023   LDLCALC 38 04/25/2023   LDLCALC 111 (H) 01/19/2023   Lab Results  Component Value Date   TSH 1.79 04/25/2023   TSH 1.76 01/19/2023    Therapeutic Level Labs: No results found for: "LITHIUM" No results found for: "VALPROATE" No results found for: "CBMZ"   Screenings: AIMS    Flowsheet Row Admission (Discharged) from 08/12/2022 in [facility] BEH[facility]IORAL [facility]  AIMS Total Score 2      AUDIT    Flowsheet Row Admission (Discharged) from 08/12/2022 in [facility]AL MEDICINE  Alcohol Use Disorder Identification Test Final Score (AUDIT) 0      GAD-7    Flowsheet Row Video Visit from 10/13/2022 in [facility] at [facility] Office Visit from 06/30/2022 [name] Office Visit from 05/26/2022 in [facility] at [facility] Office Visit from 01/16/2017 in [name]  Total GAD-7 Score 0 11 11 3       PHQ2-9    Flowsheet Row Clinical Support from 02/10/2023 in [name] Office Visit from 01/19/2023 in [facility]  Primary Care at [facility] Video Visit from 10/13/2022 in [facility] Care at [facility] Telephone from 08/19/2022 in [facility] Office Visit from 06/30/2022 in [name]  PHQ-2 Total Score 0 0 0 2 3  PHQ-9 Total Score -- -- 0 -- 4      Flowsheet Row Admission (Discharged) from 11/04/2022 in [facility] Admission (Discharged) from 10/18/2022 in [facility] Pre-Admission Testing 60 from 10/13/2022 in [facility] PREADMISSION TESTING  C-SSRS RISK CATEGORY No Risk No Risk No Risk       Collaboration of Care: Collaboration of Care: Medication Management AEB medication prescription, Primary Care Provider AEB chart review, and Other provider involved in patient's care AEB oncology chart review  Patient/Guardian was advised Release of Information must be obtained prior to any record release in order to collaborate their care with an outside provider. Patient/Guardian was advised if they have not already done so to contact the registration department to sign all necessary forms in order for [name] to release information regarding their care.   Consent: Patient/Guardian gives verbal consent for treatment and assignment of benefits for services provided during this visit. Patient/Guardian expressed understanding and agreed to proceed.    Stasia Cavalier, MD 04/28/2023, 9:23 AM   Virtual Visit via Video Note  I connected with Victoria [name] on 04/28/23 at  4:30 PM EST by a video enabled telemedicine application and verified that I am speaking with the correct person using two identifiers.  Location: Patient: Home Provider: Home Office   I discussed the limitations of evaluation and management by telemedicine and the availability of in person appointments. The patient expressed understanding and agreed to proceed.   I discussed the assessment and treatment plan  with the patient. The patient was provided an opportunity to ask questions and all were answered. The patient agreed with the plan and demonstrated an understanding of the instructions.   The patient was advised to call back or seek an in-person evaluation if the symptoms worsen  or if the condition fails to improve as anticipated.  I provided 20 minutes of non-face-to-face time during this encounter.   Stasia Cavalier, MD

## 2023-04-24 NOTE — Assessment & Plan Note (Signed)
Encourage heart healthy diet such as MIND or DASH diet, increase exercise, avoid trans fats, simple carbohydrates and processed foods, consider a krill or fish or flaxseed oil cap daily.  °

## 2023-04-24 NOTE — Assessment & Plan Note (Signed)
Asymptomatic, continue to monitor and manage medically

## 2023-04-25 ENCOUNTER — Ambulatory Visit (INDEPENDENT_AMBULATORY_CARE_PROVIDER_SITE_OTHER): Payer: PPO | Admitting: Family Medicine

## 2023-04-25 VITALS — BP 110/72 | HR 73 | Temp 98.0°F | Resp 18 | Ht 61.0 in | Wt 166.6 lb

## 2023-04-25 DIAGNOSIS — M545 Low back pain, unspecified: Secondary | ICD-10-CM | POA: Diagnosis not present

## 2023-04-25 DIAGNOSIS — G4733 Obstructive sleep apnea (adult) (pediatric): Secondary | ICD-10-CM | POA: Diagnosis not present

## 2023-04-25 DIAGNOSIS — M542 Cervicalgia: Secondary | ICD-10-CM | POA: Diagnosis not present

## 2023-04-25 DIAGNOSIS — F419 Anxiety disorder, unspecified: Secondary | ICD-10-CM | POA: Diagnosis not present

## 2023-04-25 DIAGNOSIS — R739 Hyperglycemia, unspecified: Secondary | ICD-10-CM | POA: Diagnosis not present

## 2023-04-25 DIAGNOSIS — M4120 Other idiopathic scoliosis, site unspecified: Secondary | ICD-10-CM | POA: Diagnosis not present

## 2023-04-25 DIAGNOSIS — M810 Age-related osteoporosis without current pathological fracture: Secondary | ICD-10-CM

## 2023-04-25 DIAGNOSIS — I5032 Chronic diastolic (congestive) heart failure: Secondary | ICD-10-CM

## 2023-04-25 DIAGNOSIS — T466X5A Adverse effect of antihyperlipidemic and antiarteriosclerotic drugs, initial encounter: Secondary | ICD-10-CM

## 2023-04-25 DIAGNOSIS — J4550 Severe persistent asthma, uncomplicated: Secondary | ICD-10-CM

## 2023-04-25 DIAGNOSIS — F32A Depression, unspecified: Secondary | ICD-10-CM | POA: Diagnosis not present

## 2023-04-25 DIAGNOSIS — E782 Mixed hyperlipidemia: Secondary | ICD-10-CM | POA: Diagnosis not present

## 2023-04-25 DIAGNOSIS — Z23 Encounter for immunization: Secondary | ICD-10-CM

## 2023-04-25 DIAGNOSIS — G720 Drug-induced myopathy: Secondary | ICD-10-CM | POA: Diagnosis not present

## 2023-04-25 LAB — LIPID PANEL
Cholesterol: 126 mg/dL (ref 0–200)
HDL: 63.2 mg/dL (ref >39.00)
LDL Cholesterol: 38 mg/dL (ref 0–99)
NonHDL: 63.2
Total CHOL/HDL Ratio: 2
Triglycerides: 125 mg/dL (ref 0.0–149.0)
VLDL: 25 mg/dL (ref 0.0–40.0)

## 2023-04-25 LAB — CBC WITH DIFFERENTIAL/PLATELET
Basophils Absolute: 0 10*3/uL (ref 0.0–0.1)
Basophils Relative: 0.1 % (ref 0.0–3.0)
Eosinophils Absolute: 0 10*3/uL (ref 0.0–0.7)
Eosinophils Relative: 0 % (ref 0.0–5.0)
HCT: 34.6 % — ABNORMAL LOW (ref 36.0–46.0)
Hemoglobin: 11 g/dL — ABNORMAL LOW (ref 12.0–15.0)
Lymphocytes Relative: 15.9 % (ref 12.0–46.0)
Lymphs Abs: 0.8 10*3/uL (ref 0.7–4.0)
MCHC: 31.8 g/dL (ref 30.0–36.0)
MCV: 90.9 fL (ref 78.0–100.0)
Monocytes Absolute: 0.4 10*3/uL (ref 0.1–1.0)
Monocytes Relative: 8.6 % (ref 3.0–12.0)
Neutro Abs: 3.6 10*3/uL (ref 1.4–7.7)
Neutrophils Relative %: 75.4 % (ref 43.0–77.0)
Platelets: 272 10*3/uL (ref 150.0–400.0)
RBC: 3.81 Mil/uL — ABNORMAL LOW (ref 3.87–5.11)
RDW: 15.3 % (ref 11.5–15.5)
WBC: 4.8 10*3/uL (ref 4.0–10.5)

## 2023-04-25 LAB — COMPREHENSIVE METABOLIC PANEL
ALT: 13 U/L (ref 0–35)
AST: 16 U/L (ref 0–37)
Albumin: 3.8 g/dL (ref 3.5–5.2)
Alkaline Phosphatase: 49 U/L (ref 39–117)
BUN: 14 mg/dL (ref 6–23)
CO2: 29 meq/L (ref 19–32)
Calcium: 8.8 mg/dL (ref 8.4–10.5)
Chloride: 101 meq/L (ref 96–112)
Creatinine, Ser: 0.65 mg/dL (ref 0.40–1.20)
GFR: 92.04 mL/min (ref >60.00)
Glucose, Bld: 91 mg/dL (ref 70–99)
Potassium: 4.4 meq/L (ref 3.5–5.1)
Sodium: 137 meq/L (ref 135–145)
Total Bilirubin: 0.2 mg/dL (ref 0.2–1.2)
Total Protein: 6.1 g/dL (ref 6.0–8.3)

## 2023-04-25 LAB — HEMOGLOBIN A1C: Hgb A1c MFr Bld: 5.3 % (ref 4.6–6.5)

## 2023-04-25 LAB — TSH: TSH: 1.79 u[IU]/mL (ref 0.35–5.50)

## 2023-04-25 NOTE — Patient Instructions (Signed)
Covid and flu booster annually at pharmacy RSV, Respiratory Syncitial Virus Vaccine, Arexvy at pharmacy

## 2023-04-25 NOTE — Assessment & Plan Note (Signed)
Her pain is mostly on the left and starts mid back. Her ortho Dr Madilyn Fireman says her scoliosis is worsening.

## 2023-04-25 NOTE — Progress Notes (Signed)
Subjective:    Patient ID: Victoria [name], female    DOB: 04-28-1956, 67 y.o.   MRN: [id number]  Chief Complaint  Patient presents with   Follow-up    HPI Discussed the use of AI scribe software for clinical note transcription with the patient, who gave verbal consent to proceed.  History of Present Illness   The patient, with a history of chronic back pain and osteoporosis, reports no improvement in their back condition despite physical therapy. The back pain is daily and restricts the patient's ability to walk for exercise due to pain that originates from the mid-back and radiates down the left side. The patient has been on Prolia for osteoporosis and reports some improvement in their femur bone density, but no change in their forearm.  The patient also reports weight gain since starting Risperidone in February, which has led to an increase in their weight by 16 pounds. The patient expresses dissatisfaction with this weight gain, as it took them two years to reduce their weight to 150 pounds. The patient also experiences anxiety symptoms in the morning, which seem to improve after taking their morning medications.  The patient also mentions a family member, Jorja Loa, who has developmental delays and is currently seeking a permanent placement. The patient is considering a nursing center for [name]'s placement.        Past Medical History:  Diagnosis Date   Allergic bronchopulmonary aspergillosis (HCC) 2008   sees Dr Wenda Overland pulmonology   Anemia    iron deficiency, resolved   Anxiety    Asthma    Breast cancer (HCC)    CAD (coronary artery disease)    a. LHC 6/16:  oOM1 60, pRCA 25 >> med Rx b. cath 3/19 2nd OM with 95% stenosis s/p synergy DES & anomalous RCA   CAP (community acquired pneumonia) 2016; 06/07/2016   Chronic bronchitis (HCC)    Chronic lower back pain    Complication of anesthesia    "think I have a hard time waking up from it"   Depression    mild    Diverticulitis    Diverticulosis    GERD (gastroesophageal reflux disease)    H/O hiatal hernia    Headache    "weekly" (08/23/2017)   History of echocardiogram    Echo 6/16:  Mod LVH, EF 60-65%, no RWMA, Gr 1 DD, trivial MR, normal LA size.   History of radiation therapy    Left breast- 12/26/22-01/23/23-Dr. Antony Blackbird   Hyperglycemia 11/20/2015   Hyperlipidemia, mixed 09/11/2007   Qualifier: Diagnosis of  By: Janit Bern   Did not tolerate Lipitor, zocor, Lovastatin, Pravastatin, Livalo, Crestor even low dose    IBS (irritable bowel syndrome)    Maxillary sinusitis    Normal cardiac stress test 11/2011   No evidence of ischemia or infarct.   Calculated ejection fraction 72%.   Obesity    OSA (obstructive sleep apnea) 02/2012   has stopped using  cpap   Osteoarthritis    Osteoporosis    Personal history of radiation therapy    Pneumonia 11/2011   "before 2013 I hadn't had pneumonia since I was a child" (04/13/2012)   Pulmonary nodules    S/P angioplasty with stent 08/23/17 ostial 2nd OM with DES synnergy 08/24/2017   Schatzki's ring     Past Surgical History:  Procedure Laterality Date   ANTERIOR AND POSTERIOR REPAIR N/A 10/18/2022   Procedure: ANTERIOR (CYSTOCELE);  Surgeon: Marcelle Overlie, MD;  Location:  [facility] OR;  Service: Gynecology;  Laterality: N/A;   APPENDECTOMY  1989   BREAST BIOPSY  11/03/2022   MM LT RADIOACTIVE SEED LOC MAMMO GUIDE 11/03/2022 [facility] MAMMOGRAPHY   BREAST LUMPECTOMY WITH RADIOACTIVE SEED LOCALIZATION Left 11/04/2022   Procedure: LEFT BREAST LUMPECTOMY WITH RADIOACTIVE SEED LOCALIZATION;  Surgeon: Griselda Miner, MD;  Location: [facility];  Service: General;  Laterality: Left;   CARDIAC CATHETERIZATION N/A 11/25/2014   Procedure: Right/Left Heart Cath and Coronary Angiography;  Surgeon: Lyn Records, MD;  Location: [facility] INVASIVE CV LAB;  Service: Cardiovascular;  Laterality: N/A;   CESAREAN SECTION  1985   COLONOSCOPY  03/2022    CORONARY ANGIOPLASTY WITH STENT PLACEMENT  08/23/2017   CORONARY STENT INTERVENTION N/A 08/23/2017   Procedure: CORONARY STENT INTERVENTION;  Surgeon: Kathleene Hazel, MD;  Location: [facility] INVASIVE CV LAB;  Service: Cardiovascular;  Laterality: N/A;   HERNIA REPAIR  04/13/2012   VHR laparoscopic   LAPAROSCOPIC BILATERAL SALPINGO OOPHERECTOMY Bilateral 10/18/2022   Procedure: LAPAROTOMY WITH BILATERAL SALPINGO OOPHORECTOMY;  Surgeon: Marcelle Overlie, MD;  Location: [facility] OR;  Service: Gynecology;  Laterality: Bilateral;   LEFT HEART CATH AND CORONARY ANGIOGRAPHY N/A 08/23/2017   Procedure: LEFT HEART CATH AND CORONARY ANGIOGRAPHY;  Surgeon: Kathleene Hazel, MD;  Location: [facility] INVASIVE CV LAB;  Service: Cardiovascular;  Laterality: N/A;   VAGINAL HYSTERECTOMY N/A 10/18/2022   Procedure: HYSTERECTOMY VAGINAL;  Surgeon: Marcelle Overlie, MD;  Location: [facility] OR;  Service: Gynecology;  Laterality: N/A;   VENTRAL HERNIA REPAIR  04/13/2012   Procedure: LAPAROSCOPIC VENTRAL HERNIA;  Surgeon: [name], MD;  Location: [facility] OR;  Service: General;  Laterality: N/A;  laparoscopic repair of incarcerated hernia    Family History  Problem Relation Age of Onset   Breast cancer Mother 62       bilateral breast cancer dx. 21, met to liver   Hypertension Mother    Diabetes Mother    Diverticulosis Father    Prostate cancer Father    Breast cancer Sister 74       DCIS at 64, IDC at 18, PALB2+   Cancer Sister        breast cancer, invasive ductal carcinoma in 2022,DCIS at 74 with 4 weeks of radiation, 5 years of Tamoxifen    Pulmonary embolism Brother        recurrent   Heart attack Maternal Grandfather    Ovarian cancer Paternal Grandmother    Cerebral palsy Son    Prostate cancer Paternal Uncle    Breast cancer Niece 20       PALB2+   Osteoporosis Niece    Stroke Neg Hx    Colon cancer Neg Hx    Esophageal cancer Neg Hx    Stomach cancer Neg Hx    Rectal cancer Neg Hx     Social  History   Socioeconomic History   Marital status: Married    Spouse name: Not on file   Number of children: 1   Years of education: 14   Highest education level: Associate degree: academic program  Occupational History   Occupation: Disabled   Tobacco Use   Smoking status: Never    Passive exposure: Never   Smokeless tobacco: Never  Vaping Use   Vaping status: Never Used  Substance and Sexual Activity   Alcohol use: Yes    Alcohol/week: 4.0 - 7.0 standard drinks of alcohol    Types: 4 - 7 Standard drinks or equivalent per week  Drug use: No   Sexual activity: Yes    Birth control/protection: Surgical    Comment: gluten free, lives with husband and son with CP quadriplegia  Other Topics Concern   Not on file  Social History Narrative   Cares for son with cerebral palsy.    Lives at home with her husband and son.   Right-handed.   2 cups caffeine per day.   One story home   Social Determinants of Health   Financial Resource Strain: Low Risk  (01/18/2023)   Overall Financial Resource Strain (CARDIA)    Difficulty of Paying Living Expenses: Not hard at all  Food Insecurity: No Food Insecurity (01/18/2023)   Hunger Vital Sign    Worried About Running Out of Food in the Last Year: Never true    Ran Out of Food in the Last Year: Never true  Transportation Needs: No Transportation Needs (10/24/2022)   PRAPARE - Administrator, Civil Service (Medical): No    Lack of Transportation (Non-Medical): No  Physical Activity: Inactive (02/10/2023)   Exercise Vital Sign    Days of Exercise per Week: 0 days    Minutes of Exercise per Session: 0 min  Stress: Stress Concern Present (01/18/2023)   [employer] of Occupational Health - Occupational Stress Questionnaire    Feeling of Stress : To some extent  Social Connections: Unknown (01/18/2023)   Social Connection and Isolation Panel [NHANES]    Frequency of Communication with Friends and Family: Three times a week     Frequency of Social Gatherings with Friends and Family: Once a week    Attends Religious Services: More than 4 times per year    Active Member of [employer] or Organizations: Not on file    Attends Banker Meetings: Not on file    Marital Status: Married  Intimate Partner Violence: Not At Risk (10/19/2022)   Humiliation, Afraid, Rape, and Kick questionnaire    Fear of Current or Ex-Partner: No    Emotionally Abused: No    Physically Abused: No    Sexually Abused: No    Outpatient Medications Prior to Visit  Medication Sig Dispense Refill   acetaminophen (TYLENOL) 650 MG CR tablet Take 650 mg by mouth every 8 (eight) hours as needed for pain.     albuterol (VENTOLIN HFA) 108 (90 Base) MCG/ACT inhaler Inhale 1-2 puffs into the lungs every 6 (six) hours as needed. 8 g 2   ascorbic acid (VITAMIN C) 500 MG tablet Take 500 mg by mouth daily.     aspirin EC 81 MG tablet Take 81 mg by mouth daily. Swallow whole.     Benralizumab (FASENRA PEN) 30 MG/ML SOAJ Inject 1 mL (30 mg total) into the skin every 8 (eight) weeks. 1 mL 2   budesonide (ENTOCORT EC) 3 MG 24 hr capsule TAKE 1 CAPSULE (3 MG TOTAL) BY MOUTH DAILY. 30 capsule 1   Calcium-Magnesium-Vitamin D (CALCIUM MAGNESIUM PO) Take 1 tablet by mouth at bedtime.     Cholecalciferol (VITAMIN D3 ULTRA STRENGTH) 125 MCG (5000 UT) capsule Take 5,000 Units by mouth daily.     clonazePAM (KLONOPIN) 0.5 MG tablet Take 1 tablet (0.5 mg total) by mouth daily. 30 tablet 2   denosumab (PROLIA) 60 MG/ML SOSY injection Inject 60 mg into the skin every 6 (six) months.     doxepin (SINEQUAN) 25 MG capsule Take 1 capsule (25 mg total) by mouth at bedtime. 30 capsule 3   Evolocumab (REPATHA  SURECLICK) 140 MG/ML SOAJ Inject 140 mg into the skin every 14 (fourteen) days. 2 mL 11   famotidine (PEPCID) 20 MG tablet TAKE 1 TABLET BY MOUTH TWICE A DAY 60 tablet 11   ibuprofen (ADVIL) 200 MG tablet Take 400 mg by mouth every 6 (six) hours as needed for moderate  pain.     ipratropium-albuterol (DUONEB) 0.5-2.5 (3) MG/3ML SOLN Take 3 mLs by nebulization every 6 (six) hours as needed. 120 mL 5   nitroGLYCERIN (NITROSTAT) 0.4 MG SL tablet Place 1 tablet (0.4 mg total) under the tongue every 5 (five) minutes as needed for chest pain. 25 tablet 11   oxyCODONE (ROXICODONE) 5 MG immediate release tablet Take 1 tablet (5 mg total) by mouth every 6 (six) hours as needed for severe pain. 10 tablet 0   pindolol (VISKEN) 5 MG tablet Take 1 tablet (5 mg total) by mouth daily after breakfast. 30 tablet 3   risperiDONE (RISPERDAL) 0.5 MG tablet Take 1 tablet (0.5 mg total) by mouth daily AND 0.5 tablets (0.25 mg total) every evening. Take 1 tablet (0.5 mg) in the morning and half a tablet (0.25 mg) at 6-7pm. 45 tablet 2   tamoxifen (NOLVADEX) 20 MG tablet Take 1 tablet (20 mg total) by mouth daily. 30 tablet 3   venlafaxine XR (EFFEXOR XR) 75 MG 24 hr capsule Take 3 capsules (225 mg total) by mouth daily with breakfast. 90 capsule 1   No facility-administered medications prior to visit.    Allergies  Allergen Reactions   Latuda [Lurasidone Hcl] Other (See Comments)    PER THE PT CAUSED RESTLESSNESS   Beclomethasone Dipropionate Hives and Other (See Comments)     weight gain   Flexeril [Cyclobenzaprine] Anxiety   Mometasone Furo-Formoterol Fum Hives and Other (See Comments)    weight gain   Sulfonamide Derivatives Hives and Rash   Statins Other (See Comments)    Myalgias, RLS    Review of Systems  Constitutional:  Positive for malaise/fatigue. Negative for fever.  HENT:  Negative for congestion.   Eyes:  Negative for blurred vision.  Respiratory:  Negative for shortness of breath.   Cardiovascular:  Negative for chest pain, palpitations and leg swelling.  Gastrointestinal:  Negative for abdominal pain, blood in stool and nausea.  Genitourinary:  Negative for dysuria and frequency.  Musculoskeletal:  Positive for back pain. Negative for falls.  Skin:   Negative for rash.  Neurological:  Negative for dizziness, loss of consciousness and headaches.  Endo/Heme/Allergies:  Negative for environmental allergies.  Psychiatric/Behavioral:  Positive for depression. The patient is not nervous/anxious.        Objective:    Physical Exam  BP 110/72 (BP Location: Left Arm, Patient Position: Sitting, Cuff Size: Normal)   Pulse 73   Temp 98 F (36.7 C) (Oral)   Resp 18   Ht 5\' 1"  (1.549 m)   Wt 166 lb 9.6 oz (75.6 kg)   SpO2 99%   BMI 31.48 kg/m  Wt Readings from Last 3 Encounters:  04/25/23 166 lb 9.6 oz (75.6 kg)  04/17/23 167 lb 6.4 oz (75.9 kg)  02/27/23 166 lb 4 oz (75.4 kg)    Diabetic Foot Exam - Simple   No data filed    Lab Results  Component Value Date   WBC 4.8 04/25/2023   HGB 11.0 (L) 04/25/2023   HCT 34.6 (L) 04/25/2023   PLT 272.0 04/25/2023   GLUCOSE 91 04/25/2023   CHOL 126 04/25/2023  TRIG 125.0 04/25/2023   HDL 63.20 04/25/2023   LDLDIRECT 177.8 09/07/2011   LDLCALC 38 04/25/2023   ALT 13 04/25/2023   AST 16 04/25/2023   NA 137 04/25/2023   K 4.4 04/25/2023   CL 101 04/25/2023   CREATININE 0.65 04/25/2023   BUN 14 04/25/2023   CO2 29 04/25/2023   TSH 1.79 04/25/2023   INR 1.0 08/21/2017   HGBA1C 5.3 04/25/2023    Lab Results  Component Value Date   TSH 1.79 04/25/2023   Lab Results  Component Value Date   WBC 4.8 04/25/2023   HGB 11.0 (L) 04/25/2023   HCT 34.6 (L) 04/25/2023   MCV 90.9 04/25/2023   PLT 272.0 04/25/2023   Lab Results  Component Value Date   NA 137 04/25/2023   K 4.4 04/25/2023   CO2 29 04/25/2023   GLUCOSE 91 04/25/2023   BUN 14 04/25/2023   CREATININE 0.65 04/25/2023   BILITOT 0.2 04/25/2023   ALKPHOS 49 04/25/2023   AST 16 04/25/2023   ALT 13 04/25/2023   PROT 6.1 04/25/2023   ALBUMIN 3.8 04/25/2023   CALCIUM 8.8 04/25/2023   ANIONGAP 6 01/12/2023   EGFR 101 09/23/2021   GFR 92.04 04/25/2023   Lab Results  Component Value Date   CHOL 126 04/25/2023    Lab Results  Component Value Date   HDL 63.20 04/25/2023   Lab Results  Component Value Date   LDLCALC 38 04/25/2023   Lab Results  Component Value Date   TRIG 125.0 04/25/2023   Lab Results  Component Value Date   CHOLHDL 2 04/25/2023   Lab Results  Component Value Date   HGBA1C 5.3 04/25/2023       Assessment & Plan:  Need for pneumococcal 20-valent conjugate vaccination -     Pneumococcal conjugate vaccine 20-valent  Anxiety and depression Assessment & Plan: Overall stable on current meds and managing her new diagnosis well. Continues to be under a great deal of stress   Chronic diastolic CHF (congestive heart failure), NYHA class 2 (HCC) Assessment & Plan: Asymptomatic, continue to monitor and manage medically  Orders: -     CBC with Differential/Platelet -     TSH  Hyperglycemia Assessment & Plan: hgba1c acceptable, minimize simple carbs. Increase exercise as tolerated. Consider Prevnar 20, Arexvy and covid and flu boosters annually  Orders: -     Hemoglobin A1c -     TSH  Hyperlipidemia, mixed Assessment & Plan: Encourage heart healthy diet such as MIND or DASH diet, increase exercise, avoid trans fats, simple carbohydrates and processed foods, consider a krill or fish or flaxseed oil cap daily.   Orders: -     Lipid panel -     TSH  Age-related osteoporosis without current pathological fracture Assessment & Plan: Encouraged to get adequate exercise, calcium and vitamin d intake. Dexa scan recently completed with [facility]. Reviewed to her 2022 scan her left femur improved from -1.5 to -1.1 and right femur improved from 2.3 to 1.5  the forearm is unchanged at 2.8 her surgeon wants her to consider changing her meds from Prolia, will refer to endocrinology for further consideration  Orders: -     Ambulatory referral to Endocrinology -     Comprehensive metabolic panel -     Vitamin D 1,25 dihydroxy  Severe persistent asthma with  intensive monitoring Assessment & Plan: Followint with pulmonary  Orders: -     CBC with Differential/Platelet  Statin myopathy Assessment &  Plan: Does not tolerate statins   OSA (obstructive sleep apnea) Assessment & Plan: Not interested in CPAP at this time   Idiopathic scoliosis and kyphoscoliosis Assessment & Plan: Her pain is mostly on the left and starts mid back. Her ortho Dr Madilyn Fireman says her scoliosis is worsening.      Assessment and Plan    Chronic Back Pain Daily pain, mid-back down, left side, limiting walking distance and exercise. Undergoing physical therapy which is providing some relief. -Continue physical therapy. -Consider light weight lifting for arm strength. -Consider water therapy, with caution to not overexert.  Osteoporosis On Prolia, with some improvement noted in femur bone density, but not as much as expected. Referred to endocrinologist for further management. -Schedule appointment with local endocrinologist. -Continue Prolia, next dose due in December. -Continue calcium and vitamin D supplementation.  Anxiety Morning symptoms of anxiety, improving after medication intake. -Take clonazepam immediately upon waking. -Consider use of homeopathic stress calm medication.  Weight Gain Significant weight gain since starting Risperidone, causing distress. -Consideration of Wegovy pending insurance coverage.  General Health Maintenance -Administer Prevnar 20 today. -Consider RSV and COVID booster in a few weeks. -Order blood work including glucose, vitamin D, and cholesterol. -Schedule follow-up appointment for early 2025.         Danise Edge, MD

## 2023-04-26 ENCOUNTER — Telehealth: Payer: Self-pay | Admitting: Emergency Medicine

## 2023-04-26 ENCOUNTER — Other Ambulatory Visit: Payer: Self-pay

## 2023-04-26 DIAGNOSIS — M810 Age-related osteoporosis without current pathological fracture: Secondary | ICD-10-CM

## 2023-04-26 MED ORDER — DENOSUMAB 60 MG/ML ~~LOC~~ SOSY
60.0000 mg | PREFILLED_SYRINGE | Freq: Once | SUBCUTANEOUS | Status: AC
  Administered 2023-06-08: 60 mg via SUBCUTANEOUS

## 2023-04-26 NOTE — Telephone Encounter (Signed)
-----   Message from Danise Edge sent at 04/25/2023  9:00 PM EST ----- Labs stable no new concerns, no changes

## 2023-04-26 NOTE — Telephone Encounter (Signed)
CAM placed.

## 2023-04-27 ENCOUNTER — Telehealth (HOSPITAL_BASED_OUTPATIENT_CLINIC_OR_DEPARTMENT_OTHER): Payer: PPO | Admitting: Psychiatry

## 2023-04-27 DIAGNOSIS — F419 Anxiety disorder, unspecified: Secondary | ICD-10-CM

## 2023-04-27 DIAGNOSIS — F32A Depression, unspecified: Secondary | ICD-10-CM

## 2023-04-27 MED ORDER — CLONAZEPAM 0.5 MG PO TABS: 0.5000 mg | ORAL_TABLET | Freq: Every day | ORAL | 2 refills | Status: DC

## 2023-04-27 MED ORDER — RISPERIDONE 0.5 MG PO TABS: 0.5000 mg | ORAL_TABLET | Freq: Two times a day (BID) | ORAL | 2 refills | Status: DC

## 2023-04-27 MED ORDER — PINDOLOL 5 MG PO TABS: 5.0000 mg | ORAL_TABLET | Freq: Every day | ORAL | 3 refills | Status: DC

## 2023-04-27 NOTE — Patient Instructions (Addendum)
Severe persistent asthma with intensive monitoring A B P A-ALLERGIC BRONCHOPULMONARY ASPERGILLOSIS Moderate persistent asthmatic bronchitis with acute exacerbation  -Asthma is well controlled  Plan -continue with Fasenra  - Retstart asmanex low dose 2 puff twice daily  - CMA to do refill 04/28/2023 if it was not done iin July 2024 - continue albuterol as needed - STOP PINDOLOL in setting of asthma - talk to your psychiatrist about it  - flu and RSV vaccines in the fall 2024   Dyspnea on Exertion  - likely due to stiff heart muscle (on echo 2024, with normal stress test), weight gain and physical deconditioning along with back pain - less likely due o lung disease   Plan  - monitoring approach  Followup 6 months or sooner if needed

## 2023-04-27 NOTE — Progress Notes (Signed)
OV 10/06/2016  Chief Complaint  Patient presents with   Follow-up    Pt states her asthma is good; had one flare up since last visit. SOB with exertion, cough with yellow to brown mucus prodcution, with frequent wheezing. Notice improvement with being on [employer]      67 year old female with severe asthma with ABPA and elevated IgE and eosinophilia. She is on IL5RAb antibody treatment now for the last few months. She feels this is helping her significantly. Her symptoms course of better. She is on Asmanex, Spiriva, Singulair as well. She is not on daily prednisone anymore. In December 2017 should she did grow MRSA from sputum culture. Currently a few days ago she developed sore throat and having some sputum production. She is worried this might be MRSA. Also asthma itself is stable and well controlled according to history. She is wondering about getting a sputum culture and really wishes for it    OV 01/11/2017  Chief Complaint  Patient presents with   Follow-up    Pt states her breathing is doing well. Pt states the last couple days she has had more wheezing. Pt states she has noticed an improvement being on Cinqair.    67 year old female with severe asthma with ABPA and elevated IgE and eosinophilia. She is on IL5RAb antibody treatment.  She is on Asmanex, Spiriva, Singulair as well. She is not on daily prednisone anymore   01/11/2017 - Here for routine follow-up. She is on interleukin-5 receptor antibody IV infusion therapy. This was held a few weeks ago because of asthma exacerbation. She says approximately on Father's Day 2018 he started feeling unwell and then ended up with an exacerbation for which she needed antibiotics and prednisone. She is surprised by this decompensation despite being on this biologic immunotherapy for asthma. She is compliant with her Asmanex, Spiriva and Singulair. She is not on prednisone. Currently she feels baseline. Patient infusion #6 or 7 is due for  tomorrow [interleukin-5 receptor antibody). Overall she feels improved quality of life. Despite all this exhaled nitric oxide continues to be HIGH; unclear why   OV 04/21/2017  Chief Complaint  Patient presents with   Follow-up    Pt states that she has been doing good. Has had no flare-ups with her asthma. Mild coughing at night. Denies any SOB or CP.  severe asthma with ABPA and elevated IgE and eosinophilia. She is on IL5RAb antibody treatment.  She is on Asmanex, Spiriva, Singulair as well. She is not on daily prednisone anymore  Continues cinqair spiriva, singulair, and asmanex. At this point in time the interleukin-5 receptor antibody is working really well for her. For nitric oxide today is lowest ever.. In terms of his symptoms she hardly ever gets up at night. When she wakes up she does not have any symptoms. She only has slight limitation with physical activity. She's not shortness of breath because of asthma not wheezing and not using albuterol for rescue use. The asthma control score is 0.8. However she is extremely frustrated by the [facility] gravida cycle management of her interleukin-5 receptor antibody infusions. She is getting different bills wrong bills missed bills. Therefore she wants to switch to subcutaneous injectable therapy. She is very upset about the quality of care that she has received at the infusion center because of billing practices.  OV 10/19/2017  Chief Complaint  Patient presents with   Follow-up    PFT done today. Pt states she has been  doing well. Has had a little more coughing and wheezing recently and thinks it could be due to the weather. Pt just got started back on Fasenra injections.   Severe persistent asthma with ABPA elevated IgE and eosinophilia. S/P cinqair interleukin-5 receptor antibody treatment ending" 2018 with good response but stopped secondary to billing errors at [facility] infusion center. Based on medical need then switched  to anti-eosinophil interleukin-5 receptor antibody subcutaneous injection treatment Fasenra early 2019. She is also on baseline Asmanex, Spiriva, Singulair and antihistamine 8. She is not on daily prednisone anymore.  Last seen November 2018. Overall she was doing well with good effort tolerance but in March 2019 had some atypical chest painand ended up with a coronary artery stent in the abuse marginal. She is improved from that. She is wondering about taking a beta blocker safely. She does not feel that his anemia because of blood pressure is low but apparently cardiology is asked her to clear this with me. In the interim her IV injection interleukin-5 receptor antibody has been changed to subcutaneous injection FASENRA . Because of her cardiac issues she's only taken 3 doses since January 2019. She hopes to go back on schedule with this. She had billing issues at the [facility] infusion center with the IV infusion and that is why we switched to the subcutaneous injection. However, it looks like the workflow authorization for this was not done correctly and she isn't of the large co-pay. She is unhappy about this and routinely discussed this with the patient. In terms of asthma she feels her cough is decompensated in the last few weeks and this correlates with her not taking her Asmanex. Her lung function test is near normal today. Her exhaled nitric oxide is slightly elevated than the lowest that she's had but still better than her baseline average. Currently she is waking up at nightcoughing because of the spring pollen but only hardly. Very mild symptoms when she wakes up she is slightly limited in the daytime and she experiences shortness of breath little of the time not using albuterol for rescue.    Results for Victoria [name], Victoria [name] (MRN [id number]) as of 10/19/2017 11:51  Ref. Range 10/19/2017 10:47  FEV1-Post Latest Units: L 1.80  FEV1-%Pred-Post Latest Units: % 76  FEV1-%Change-Post Latest Units: % 0   Results for Victoria [name], Victoria [name] (MRN [id number]) as of 10/19/2017 11:51  Ref. Range 10/19/2017 10:47  TLC Latest Units: L 4.90  TLC % pred Latest Units: % 103  Results for Victoria [name], Victoria [name] (MRN [id number]) as of 10/19/2017 11:51  Ref. Range 10/19/2017 10:47  DLCO unc Latest Units: ml/min/mmHg 19.51  DLCO unc % pred Latest Units: % 90    OV 05/14/2018  Subjective:  Patient ID: Victoria [name], female , DOB: 1955/09/15 , age 75 y.o. , MRN: [id number] , ADDRESS: [address] [place] [address]   05/14/2018 -   Chief Complaint  Patient presents with   Follow-up    breathing good, SOB on exertion   Severe persistent asthma with ABPA elevated IgE and eosinophilia. S/P cinqair interleukin-5 receptor antibody treatment ending" 2018 with good response but stopped secondary to billing errors at [facility] infusion center. Based on medical need then switched to anti-eosinophil interleukin-5 receptor antibody subcutaneous injection treatment Fasenra early 2019. She is also on baseline Asmanex, Spiriva, Singulair and antihistamine 8. She is not on daily prednisone anymore.  Known hiatal hernia  HPI Victoria [name] 67 y.o. -returns for follow-up of  severe persistent asthma.  Earlier this year we switched her to interleukin-5 receptor antibody subcutaneous injection may be [employer] called Fasenra.  With this she feels her asthma symptoms have improved dramatically.  She feels it is better than seeing care.  She still thankful to the scientist of developed this biologic.  Her exam nitric oxide is the lowest ever at 27.  She is not having any nighttime symptoms or any wheezing.  In fact she stopped her inhaled steroid partly because she was feeling well but partly also because she is in the donut hole.  However she is continuing her Spiriva and Singulair.  She is up-to-date with her flu shot.    She is dealing with skin infections.  Primary care physician notes reviewed.  There is question whether this is  being precipitated by the fact she is on to Biologics.  She thinks she had MRSA but when I reviewed the chart on May 08, 2018 shows MSSA.  Therefore she is been started on Augmentin today which she is yet to start.  She also has hiatal hernia.  She has not seen GI in a few years.  It is fairly well-controlled and she plans to see GI soon.  OV 02/14/2019  Subjective:  Patient ID: Victoria [name], female , DOB: Aug 17, 1955 , age 89 y.o. , MRN: [id number] , ADDRESS: [address] [place] [address]   02/14/2019 -   Chief Complaint  Patient presents with   Severe persistent asthma with intensive monitoring   Severe persistent asthma with ABPA elevated IgE and eosinophilia. S/P cinqair interleukin-5 receptor antibody treatment ending" 2018 with good response but stopped secondary to billing errors at [facility] infusion center. Based on medical need then switched to anti-eosinophil interleukin-5 receptor antibody subcutaneous injection treatment Fasenra early 2019. She is  NOT on baseline Asmanex, Spiriva, Singulair and antihistamine 8. She is not on daily prednisone anymore.  Known hiatal hernia  HPI Victoria [name] 67 y.o. -is here for follow-up of asthma with ABPA.  Since her last visit she is only on biologic Fasenra.  She has stopped Singulair, Spiriva, Asmanex.  She is not taking daily prednisone.  Asthma is well controlled.  A few weeks ago she did have a itchy feeling intake epinephrine.  She also had wheeze at this time.  This helped control the situation.  Other than that she is doing well asthma is extremely well controlled.  No nocturnal awakenings no albuterol rescue use.  We are unable to do exhaled nitric oxide testing today because of the pandemic.  She told me that she wants to remove budesonide out of her allergy list because she is only allergic to [facility] and not the nebulizer.  She is doing social distancing.  She had an appointment earlier in the year with GI because of her  hiatal hernia but because of the pandemic she did not follow through.  She is open to having this appointment and referral made again.    OV 05/04/2020  Subjective:  Patient ID: Victoria [name], female , DOB: 06-25-1955 , age 89 y.o. , MRN: [id number] , ADDRESS: [address] [place] [address] PCP Bradd Canary, MD Patient Care Team: Bradd Canary, MD as PCP - General (Family Medicine) Lars Masson, MD as PCP - Cardiology (Cardiology) Haverstock, Elvin So, MD as Referring Physician (Dermatology) Kalman Shan, MD as Consulting Physician (Pulmonary Disease) Lars Masson, MD as Consulting Physician (Cardiology) Marcelle Overlie, MD as Consulting Physician (  Obstetrics and Gynecology) Suzanna Obey, MD as Consulting Physician (Otolaryngology) Pyrtle, Carie Caddy, MD as Consulting Physician (Gastroenterology) Drema Dallas, DO as Consulting Physician (Neurology)  This Provider for this visit: Treatment Team:  Attending Provider: Kalman Shan, MD    05/04/2020 -   Chief Complaint  Patient presents with   Follow-up    Pt c/o cough since 1 day ago- prod with yellow sputum. She has also been wheezing some. She uses her duoneb 4-6 x per wk.      HPI Victoria [name] 67 y.o. -presents for follow-up of a complicated asthma with ABPA.  She has been overall doing well with Harrington Challenger.  However in September 2021 after being exposed to someone with Covid she had respiratory exacerbation but she was ruled out for Covid.  She took 5 days of prednisone and did well but she had a cough that lingered through October 2021.  Then she developed chest pains.  We did a CT angiogram that ruled out PE.  I was involved in the decision making.  It only showed hiatal hernia.  I personally visualized that CT again.  She can got worried that the Harrington Challenger is not working because of that one respiratory exacerbation and the lingering cough.  In addition she tells me that yesterday while eating  some crackers she choked and today she is wheezing.  She is noted to be on alendronate for the last 2 years.  This is by primary care physician.  In the spring 2020 when she did see GI Dr. Rhea Belton and interventional endoscopy has been recommended but she is so far holding off on that.  Her acid reflux regimen has been escalated.  She is up-to-date with her vaccine but is inquiring about Covid booster timing.   Asthma Control Test ACT Total Score  05/04/2020 19     Lab Results  Component Value Date   NITRICOXIDE 42 04/21/2017     OV 06/04/2021  Subjective:  Patient ID: Victoria [name], female , DOB: Oct 27, 1955 , age 79 y.o. , MRN: [id number] , ADDRESS: [address] [place] [address] PCP Bradd Canary, MD Patient Care Team: Bradd Canary, MD as PCP - General (Family Medicine) Lars Masson, MD (Inactive) as PCP - Cardiology (Cardiology) Haverstock, Elvin So, MD as Referring Physician (Dermatology) Kalman Shan, MD as Consulting Physician (Pulmonary Disease) Lars Masson, MD (Inactive) as Consulting Physician (Cardiology) Marcelle Overlie, MD as Consulting Physician (Obstetrics and Gynecology) Suzanna Obey, MD as Consulting Physician (Otolaryngology) Pyrtle, Carie Caddy, MD as Consulting Physician (Gastroenterology) Drema Dallas, DO as Consulting Physician (Neurology)  This Provider for this visit: Treatment Team:  Attending Provider: Kalman Shan, MD    06/04/2021 -   Chief Complaint  Patient presents with   Follow-up    Pt states she has been doing okay since last visit. States she has had sinus pressure and also some pain in sinuses.    Severe persistent asthma with ABPA elevated IgE and eosinophilia. S/P cinqair interleukin-5 receptor antibody treatment ending" 2018 with good response but stopped secondary to billing errors at [facility] infusion center. Based on medical need then switched to anti-eosinophil interleukin-5 receptor antibody  subcutaneous injection treatment Fasenra early 2019. She is  NOT on baseline Asmanex, Spiriva, Singulair and antihistamine 8. She is not on daily prednisone anymore.  Known hiatal hernia  HPI Victoria [name] 67 y.o. -returns for follow-up.  Is a routine follow-up.  At this point in time she is  on biologic Fasenra.  She is not taking any of her inhalers or Singulair.  She did see Tammy.  In the summer 2022 had an exacerbation had to go on prednisone.  Personally saw her a year ago.  Currently asthma is well controlled.  She says she has intermittent wheezing and is wondering if she should be on a maintenance inhaler.  She asked me about evidence in literature about monotherapy with Harrington Challenger.  Explained to her that Biologics was studied as add-on therapy and she needs to be on a maintenance therapy.  She is agreeable to go back on her Asmanex.  This because she does have some occasional wheezing. ACT score shows good control  Of note she is having chronic headaches.  She has been on [place] since 2019.  Prior to that she was started on Repatha.  She is also on Prolia These can cause headaches.    She had CT angio which shows stable hiatale hernia.   Asthma Control Test ACT Total Score  06/04/2021 23  03/17/2021 16  05/04/2020 19    Lab Results  Component Value Date   NITRICOXIDE 42 04/21/2017     CT Chest data  No results found.      OV 09/06/2021  Subjective:  Patient ID: Victoria [name], female , DOB: January 30, 1956 , age 56 y.o. , MRN: [id number] , ADDRESS: [address] [place] [address] PCP Bradd Canary, MD Patient Care Team: Bradd Canary, MD as PCP - General (Family Medicine) Lars Masson, MD as PCP - Cardiology (Cardiology) Haverstock, Elvin So, MD as Referring Physician (Dermatology) Kalman Shan, MD as Consulting Physician (Pulmonary Disease) Lars Masson, MD as Consulting Physician (Cardiology) Marcelle Overlie, MD as Consulting  Physician (Obstetrics and Gynecology) Suzanna Obey, MD as Consulting Physician (Otolaryngology) Pyrtle, Carie Caddy, MD as Consulting Physician (Gastroenterology) Drema Dallas, DO as Consulting Physician (Neurology)  This Provider for this visit: Treatment Team:  Attending Provider: Kalman Shan, MD    09/06/2021 -   Chief Complaint  Patient presents with   Follow-up    Pt denies any flare ups with her asthma since last visit. States that she does use her rescue inhaler when she needs it. States that she is still having pain in her sternum and ribs and in back which she has had since last visit.     Severe persistent asthma with ABPA elevated IgE and eosinophilia. S/P cinqair interleukin-5 receptor antibody treatment ending" 2018 with good response but stopped secondary to billing errors at [facility] infusion center. Based on medical need then switched to anti-eosinophil interleukin-5 receptor antibody subcutaneous injection treatment Fasenra early 2019. She is  NOT on baseline Asmanex, Spiriva, Singulair and antihistamine 8. She is not on daily prednisone anymore.  Known hiatal hernia   HPI Victoria [name] 67 y.o. -returns for her 13-month follow-up.  Asthma itself is well controlled.  However she is dealing with significant spinal issues.  She follows with Dr. Cherene Altes at a clinic in [place].  She tells me that her scoliosis is worse and the kyphosis is worse.  She is lost 1.25 inches in height and the kyphosis is worse by 13 degrees.  Associated with that she is having persistent left rib cage pain and also sternal pain.  She states at 1 point on 08/15/2021 the pain was so bad she went to the ER and called EMS.  She has been treated with meloxicam.  She also saw Dr. Noreene Filbert and's sports  medicine is being given left hip injection.  She is attended massage therapy on 08/27/2021 and periodically is used walker.  Currently somewhat better.  She has had CT scans.  She also had ANA that was positive on  09/03/2021.  The results available between 1: 80 and 1: 320.  Apparently rheumatology referral is in place.  Asthma and hiatal hernia itself under control.  She is quite bothered by the pain.  This is despite physical therapy.  She is frustrated by this.    (H): Data is abnormally highh  OV 03/07/2022  Subjective:  Patient ID: Victoria [name], female , DOB: [birth year] , age 33 y.o. , MRN: [id number] , ADDRESS: [address] [place] [address] PCP Bradd Canary, MD Patient Care Team: Bradd Canary, MD as PCP - General (Family Medicine) Lars Masson, MD as PCP - Cardiology (Cardiology) Haverstock, Elvin So, MD as Referring Physician (Dermatology) Kalman Shan, MD as Consulting Physician (Pulmonary Disease) Lars Masson, MD as Consulting Physician (Cardiology) Marcelle Overlie, MD as Consulting Physician (Obstetrics and Gynecology) Suzanna Obey, MD as Consulting Physician (Otolaryngology) Pyrtle, Carie Caddy, MD as Consulting Physician (Gastroenterology) Drema Dallas, DO as Consulting Physician (Neurology)  This Provider for this visit: Treatment Team:  Attending Provider: Kalman Shan, MD    03/07/2022 -   Chief Complaint  Patient presents with   Follow-up    Pt states she had covid about 3 weeks ago and states she still has a cough that she cannot get rid of.      HPI Victoria [name] 67 y.o. -returns for follow-up.  She continues on [place].  Last visit I reintroduced inhaled corticosteroid Asmanex but for some reason she is not on it.  We went over the rationale for Biologics which is failed inhaler therapy.  Did express concern that if she is not on inhaler therapy that her Harrington Challenger could be denied in the future.  She is going to restart Asmanex.  Currently she does not have any acid reflux symptoms.  Her back pain is better after she got new orthotics that increases her shoe height.  The only issue is that 3 weeks ago she had COVID-19.  She did take  antiviral and then she got better.  She did have some residual cough but primary care physician has given her some Hydromet.  She says the cough is working its way out.  There are no other new issues.  She will have a flu shot today.   OV 09/15/2022  Subjective:  Patient ID: Victoria [name], female , DOB: 08/20/1955 , age 36 y.o. , MRN: [id number] , ADDRESS: [address] [place] [address] PCP Bradd Canary, MD Patient Care Team: Bradd Canary, MD as PCP - General (Family Medicine) Lars Masson, MD as PCP - Cardiology (Cardiology) Haverstock, Elvin So, MD as Referring Physician (Dermatology) Kalman Shan, MD as Consulting Physician (Pulmonary Disease) Lars Masson, MD as Consulting Physician (Cardiology) Marcelle Overlie, MD as Consulting Physician (Obstetrics and Gynecology) Suzanna Obey, MD as Consulting Physician (Otolaryngology) Pyrtle, Carie Caddy, MD as Consulting Physician (Gastroenterology) Drema Dallas, DO as Consulting Physician (Neurology)  This Provider for this visit: Treatment Team:  Attending Provider: Kalman Shan, MD   Severe persistent asthma with ABPA elevated IgE and eosinophilia. S/P cinqair interleukin-5 receptor antibody treatment ending" 2018 with good response but stopped secondary to billing errors at [facility] infusion center. Based on medical need then switched to anti-eosinophil interleukin-5 receptor  antibody subcutaneous injection treatment Fasenra early 2019. She is  NOT on baseline Asmanex, Spiriva, Singulair and antihistamine 8. She is not on daily prednisone anymore.  -March 2024 medications include Alwyn Pea, Flonase, DuoNeb  Known hiatal hernia  ANA positive - saw Dr Corliss Skains Sept 2023 and reassured  09/15/2022 -   Chief Complaint  Patient presents with   Follow-up    Follow up. "A lot going on"      HPI Victoria [name] 67 y.o. -returns for follow-up.  Last seen in September 2023.  Is a routine asthma  follow-up.  From an asthma perspective she is doing well.  She continues to [place].  She is yet to restart her inhaled steroid Asmanex and she wants a refill for that.  The new issues that she is having surgery for hysterectomy and bilateral salpingo-oophorectomy because of bladder prolapse.  We did a evaluation and I deem her to be low moderate risk for pulmonary complications as long as there is no asthma flareup.  The other interim issues that she had major depression and anxiety and was hospitalized and [facility] for 3 days towards the end of February 2024.  She is also undergoing breast biopsy next week.  All this is naturally upsetting for her but she did indicate that asthma itself is stable.     OV 12/23/2022  Subjective:  Patient ID: Victoria [name], female , DOB: 1955-10-27 , age 18 y.o. , MRN: [id number] , ADDRESS: [address] [place] [address] PCP Bradd Canary, MD Patient Care Team: Bradd Canary, MD as PCP - General (Family Medicine) Meriam Sprague, MD as PCP - Cardiology (Cardiology) Haverstock, Elvin So, MD as Referring Physician (Dermatology) Kalman Shan, MD as Consulting Physician (Pulmonary Disease) Lars Masson, MD as Consulting Physician (Cardiology) Marcelle Overlie, MD as Consulting Physician (Obstetrics and Gynecology) Suzanna Obey, MD as Consulting Physician (Otolaryngology) Pyrtle, Carie Caddy, MD as Consulting Physician (Gastroenterology) Donnelly Angelica, RN as Oncology Nurse Navigator Pershing Proud, RN as Oncology Nurse Navigator Griselda Miner, MD as Consulting Physician (General Surgery) Malachy Mood, MD as Consulting Physician (Hematology) Antony Blackbird, MD as Consulting Physician (Radiation Oncology)  This Provider for this visit: Treatment Team:  Attending Provider: Kalman Shan, MD   12/23/2022 -   Chief Complaint  Patient presents with   Follow-up    Cough x 3 days.  Sx worse at bedtime.     HPI Victoria [name] 67 y.o. -returns for follow-up but she is actually acutely having symptoms.  She says last week her son was ill with bronchitis but is being managed conservatively and expectantly.  This because his respiratory exam was normal.  However she tells me on 12/20/2022 she has picked up similar illness with a sore throat subsequently a cough.  She has not been able to sleep in her bed and is sleeping in a recliner because of the cough.  Today the sputum turned green.  There is no active wheezing no fever no nausea no no vomiting no diarrhea no urgent care or emergency room visits.  Of note last visit we added Asmanex to her asthma control because she is only on Fasenra right now but she says our office did not send the Asmanex.  Will try to reinitiate this prescription.  Past medical - She had a hysterectomy and it went well.  Then got diagnosed with breast cancer on the left side on 11/04/2022 with lumpectomy.  She is scheduled  for radiation for 4 weeks.  Also confirmed on external record review.    OV 04/28/2023  Subjective:  Patient ID: Victoria [name], female , DOB: 1956/05/30 , age 76 y.o. , MRN: [id number] , ADDRESS: [address] [place]  [id number] PCP Bradd Canary, MD Patient Care Team: Bradd Canary, MD as PCP - General (Family Medicine) Rollene Rotunda, MD as PCP - Cardiology (Cardiology) Haverstock, Elvin So, MD as Referring Physician (Dermatology) Kalman Shan, MD as Consulting Physician (Pulmonary Disease) Lars Masson, MD as Consulting Physician (Cardiology) Marcelle Overlie, MD as Consulting Physician (Obstetrics and Gynecology) Suzanna Obey, MD as Consulting Physician (Otolaryngology) Pyrtle, Carie Caddy, MD as Consulting Physician (Gastroenterology) Donnelly Angelica, RN as Oncology Nurse Navigator Pershing Proud, RN as Oncology Nurse Navigator Griselda Miner, MD as Consulting Physician (General Surgery) Malachy Mood, MD as Consulting Physician  (Hematology) Antony Blackbird, MD as Consulting Physician (Radiation Oncology)  This Provider for this visit: Treatment Team:  Attending Provider: Kalman Shan, MD    04/28/2023 -   Chief Complaint  Patient presents with   Follow-up    Pt denies any concerns      Severe persistent asthma with ABPA elevated IgE and eosinophilia. S/P cinqair interleukin-5 receptor antibody treatment ending" 2018 with good response but stopped secondary to billing errors [facility] infusion center. Based on medical need then switched to anti-eosinophil interleukin-5 receptor antibody subcutaneous injection treatment Fasenra early 2019. She is  NOT on baseline Asmanex, Spiriva, Singulair and antihistamine 8. She is not on daily prednisone anymore.  -March 2024 medications include Alwyn Pea, Flonase, DuoNeb  Known hiatal hernia  - sees DR Rhea Belton  ANA positive - saw Dr Corliss Skains Sept 2023 and reassured   HPI Victoria [name] 67 y.o. -returns for follow-up.  She tells me her asthma is under control.  She is doing well with biologic Fasenra.  She forgets to take her inhaled steroid.  I did remind her of the necessity to do that.  Her new issues shortness of breath on exertion relieved by rest which is worse than baseline.'s been going on for several months.  She has gained 15 pounds or so in the last year.  She is also stopped exercising because of her back pain in the last 18 months.  It is present with exertion relieved by rest. Medical record review indicates Dr. Brock Ra the primary care physician did a cardiac stress test was normal.  Did echocardiogram has grade 1 diastolic dysfunction.  Review of the medications indicates she is on beta-blocker pindolol.  She tells me she has been on this since February 2024 for anxiety.  Did indicate to her that this can make asthma worse.  Exercise hypoxemia test here just walking around our clinic she did not desaturate.  Infection become tachycardic.  In  terms of her social history: She has a only son who is disabled.  The she is planning to graduate him into a nursing home she is finding this emotionally tough.    Simple office walk 224 (66+46 x 2) feet Pod A at [facility] x  3 laps goal with forehead probe 04/28/2023    O2 used ra   Number laps completed 2   Comments about pace normal   Resting Pulse Ox/HR 98% and 71/min   Final Pulse Ox/HR 98% and 85/min   Desaturated </= 88% no   Desaturated <= 3% points no   Got Tachycardic >/= 90/min nno   Symptoms  at end of test Mild    Miscellaneous comments x     PFT     Latest Ref Rng & Units 10/19/2017   10:47 AM  ILD indicators  FVC-Pre L 2.65   FVC-Predicted Pre % 86   FVC-Post L 2.57   FVC-Predicted Post % 84   TLC L 4.90   TLC Predicted % 103   DLCO uncorrected ml/min/mmHg 19.51   DLCO UNC %Pred % 90       LAB RESULTS last 96 hours No results found.  LAB RESULTS last 90 days Recent Results (from the past 2160 hour(s))  ECHOCARDIOGRAM COMPLETE     Status: None   Collection Time: 02/13/23 11:49 AM  Result Value Ref Range   S' Lateral 2.80 cm   Single Plane A4C EF 58.8 %   Single Plane A2C EF 56.0 %   Calc EF 57.3 %   Area-P 1/2 3.03 cm2   P 1/2 time 653 msec   Est EF 55 - 60%   HM DEXA SCAN     Status: None   Collection Time: 03/14/23 12:00 AM  Result Value Ref Range   HM Dexa Scan Osteoporosis     Comment: ABSTRACTED BY HIM  Lipid panel     Status: None   Collection Time: 04/25/23 11:36 AM  Result Value Ref Range   Cholesterol 126 0 - 200 mg/dL    Comment: ATP III Classification       Desirable:  < 200 mg/dL               Borderline High:  200 - 239 mg/dL          High:  > = 308 mg/dL   Triglycerides 657.8 0.0 - 149.0 mg/dL    Comment: Normal:  <469 mg/dLBorderline High:  150 - 199 mg/dL   HDL 62.95 >28.41 mg/dL   VLDL 32.4 0.0 - 40.1 mg/dL   LDL Cholesterol 38 0 - 99 mg/dL   Total CHOL/HDL Ratio 2     Comment:                Men          Women1/2 Average  Risk     3.4          3.3Average Risk          5.0          4.42X Average Risk          9.6          7.13X Average Risk          15.0          11.0                       NonHDL 63.20     Comment: NOTE:  Non-HDL goal should be 30 mg/dL higher than patient's LDL goal (i.e. LDL goal of < 70 mg/dL, would have non-HDL goal of < 100 mg/dL)  Comprehensive metabolic panel     Status: None   Collection Time: 04/25/23 11:36 AM  Result Value Ref Range   Sodium 137 135 - 145 mEq/L   Potassium 4.4 3.5 - 5.1 mEq/L   Chloride 101 96 - 112 mEq/L   CO2 29 19 - 32 mEq/L   Glucose, Bld 91 70 - 99 mg/dL   BUN 14 6 - 23 mg/dL   Creatinine, Ser 0.27 0.40 - 1.20 mg/dL   Total Bilirubin  0.2 0.2 - 1.2 mg/dL   Alkaline Phosphatase 49 39 - 117 U/L   AST 16 0 - 37 U/L   ALT 13 0 - 35 U/L   Total Protein 6.1 6.0 - 8.3 g/dL   Albumin 3.8 3.5 - 5.2 g/dL   GFR 16.10 >96.04 mL/min    Comment: Calculated using the CKD-EPI Creatinine Equation (2021)   Calcium 8.8 8.4 - 10.5 mg/dL  CBC with Differential/Platelet     Status: Abnormal   Collection Time: 04/25/23 11:36 AM  Result Value Ref Range   WBC 4.8 4.0 - 10.5 K/uL   RBC 3.81 (L) 3.87 - 5.11 Mil/uL   Hemoglobin 11.0 (L) 12.0 - 15.0 g/dL   HCT 54.0 (L) 98.1 - 19.1 %   MCV 90.9 78.0 - 100.0 fl   MCHC 31.8 30.0 - 36.0 g/dL   RDW 47.8 29.5 - 62.1 %   Platelets 272.0 150.0 - 400.0 K/uL   Neutrophils Relative % 75.4 43.0 - 77.0 %   Lymphocytes Relative 15.9 12.0 - 46.0 %   Monocytes Relative 8.6 3.0 - 12.0 %   Eosinophils Relative 0.0 0.0 - 5.0 %   Basophils Relative 0.1 0.0 - 3.0 %   Neutro Abs 3.6 1.4 - 7.7 K/uL   Lymphs Abs 0.8 0.7 - 4.0 K/uL   Monocytes Absolute 0.4 0.1 - 1.0 K/uL   Eosinophils Absolute 0.0 0.0 - 0.7 K/uL   Basophils Absolute 0.0 0.0 - 0.1 K/uL  Hemoglobin A1c     Status: None   Collection Time: 04/25/23 11:36 AM  Result Value Ref Range   Hgb A1c MFr Bld 5.3 4.6 - 6.5 %    Comment: Glycemic Control Guidelines for People with Diabetes:Non  Diabetic:  <6%Goal of Therapy: <7%Additional Action Suggested:  >8%   TSH     Status: None   Collection Time: 04/25/23 11:36 AM  Result Value Ref Range   TSH 1.79 0.35 - 5.50 uIU/mL         has a past medical history of Allergic bronchopulmonary aspergillosis (HCC) (2008), Anemia, Anxiety, Asthma, Breast cancer (HCC), CAD (coronary artery disease), CAP (community acquired pneumonia) (2016; 06/07/2016), Chronic bronchitis (HCC), Chronic lower back pain, Complication of anesthesia, Depression, Diverticulitis, Diverticulosis, GERD (gastroesophageal reflux disease), H/O hiatal hernia, Headache, History of echocardiogram, History of radiation therapy, Hyperglycemia (11/20/2015), Hyperlipidemia, mixed (09/11/2007), IBS (irritable bowel syndrome), Maxillary sinusitis, Normal cardiac stress test (11/2011), Obesity, OSA (obstructive sleep apnea) (02/2012), Osteoarthritis, Osteoporosis, Personal history of radiation therapy, Pneumonia (11/2011), Pulmonary nodules, S/P angioplasty with stent 08/23/17 ostial 2nd OM with DES synnergy (08/24/2017), and Schatzki's ring.   reports that she has never smoked. She has never been exposed to tobacco smoke. She has never used smokeless tobacco.  Past Surgical History:  Procedure Laterality Date   ANTERIOR AND POSTERIOR REPAIR N/A 10/18/2022   Procedure: ANTERIOR (CYSTOCELE);  Surgeon: Marcelle Overlie, MD;  Location[facility] OR;  Service: Gynecology;  Laterality: N/A;   APPENDECTOMY  1989   BREAST BIOPSY  11/03/2022   MM LT RADIOACTIVE SEED LOC MAMMO GUIDE 11/03/2022 [facility] MAMMOGRAPHY   BREAST LUMPECTOMY WITH RADIOACTIVE SEED LOCALIZATION Left 11/04/2022   Procedure: LEFT BREAST LUMPECTOMY WITH RADIOACTIVE SEED LOCALIZATION;  Surgeon: Griselda Miner, MD;  Location: [facility];  Service: General;  Laterality: Left;   CARDIAC CATHETERIZATION N/A 11/25/2014   Procedure: Right/Left Heart Cath and Coronary Angiography;  Surgeon: Lyn Records, MD;  Location: [facility]  INVASIVE CV LAB;  Service: Cardiovascular;  Laterality: N/A;  CESAREAN SECTION  1985   COLONOSCOPY  03/2022   CORONARY ANGIOPLASTY WITH STENT PLACEMENT  08/23/2017   CORONARY STENT INTERVENTION N/A 08/23/2017   Procedure: CORONARY STENT INTERVENTION;  Surgeon: Kathleene Hazel, MD;  Location: [facility] INVASIVE CV LAB;  Service: Cardiovascular;  Laterality: N/A;   HERNIA REPAIR  04/13/2012   VHR laparoscopic   LAPAROSCOPIC BILATERAL SALPINGO OOPHERECTOMY Bilateral 10/18/2022   Procedure: LAPAROTOMY WITH BILATERAL SALPINGO OOPHORECTOMY;  Surgeon: Marcelle Overlie, MD;  Location: [facility] OR;  Service: Gynecology;  Laterality: Bilateral;   LEFT HEART CATH AND CORONARY ANGIOGRAPHY N/A 08/23/2017   Procedure: LEFT HEART CATH AND CORONARY ANGIOGRAPHY;  Surgeon: Kathleene Hazel, MD;  Location: [facility] INVASIVE CV LAB;  Service: Cardiovascular;  Laterality: N/A;   VAGINAL HYSTERECTOMY N/A 10/18/2022   Procedure: HYSTERECTOMY VAGINAL;  Surgeon: Marcelle Overlie, MD;  Location: [facility] OR;  Service: Gynecology;  Laterality: N/A;   VENTRAL HERNIA REPAIR  04/13/2012   Procedure: LAPAROSCOPIC VENTRAL HERNIA;  Surgeon: [name], MD;  Location: [facility] OR;  Service: General;  Laterality: N/A;  laparoscopic repair of incarcerated hernia    Allergies  Allergen Reactions   Latuda [Lurasidone Hcl] Other (See Comments)    PER THE PT CAUSED RESTLESSNESS   Beclomethasone Dipropionate Hives and Other (See Comments)     weight gain   Flexeril [Cyclobenzaprine] Anxiety   Mometasone Furo-Formoterol Fum Hives and Other (See Comments)    weight gain   Sulfonamide Derivatives Hives and Rash   Statins Other (See Comments)    Myalgias, RLS    Immunization History  Administered Date(s) Administered   Influenza Split 04/20/2011, 04/20/2020   Influenza Whole 06/06/2007, 04/15/2008, 04/02/2009, 03/29/2012   Influenza, High Dose Seasonal PF 05/05/2015   Influenza,inj,Quad PF,6+ Mos 05/09/2013, 03/03/2014, 05/03/2016,  04/21/2017, 04/02/2018, 03/19/2019, 04/20/2020, 05/03/2021, 03/07/2022   Influenza-Unspecified 04/17/2023   Moderna Sars-Covid-2 Vaccination 08/15/2019, 09/17/2019, 05/16/2020, 12/04/2020   PNEUMOCOCCAL CONJUGATE-20 04/25/2023   Pfizer Covid-19 Vaccine Bivalent Booster 43yrs & up 04/16/2021   Pneumococcal Conjugate-13 05/09/2013   Pneumococcal Polysaccharide-23 05/04/2005   Td 07/29/2009   Tdap 03/11/2015   Zoster Recombinant(Shingrix) 05/09/2019, 08/01/2019    Family History  Problem Relation Age of Onset   Breast cancer Mother 45       bilateral breast cancer dx. 40, met to liver   Hypertension Mother    Diabetes Mother    Diverticulosis Father    Prostate cancer Father    Breast cancer Sister 33       DCIS at 67, IDC at 46, PALB2+   Cancer Sister        breast cancer, invasive ductal carcinoma in 2022,DCIS at 53 with 4 weeks of radiation, 5 years of Tamoxifen    Pulmonary embolism Brother        recurrent   Heart attack Maternal Grandfather    Ovarian cancer Paternal Grandmother    Cerebral palsy Son    Prostate cancer Paternal Uncle    Breast cancer Niece 78       PALB2+   Osteoporosis Niece    Stroke Neg Hx    Colon cancer Neg Hx    Esophageal cancer Neg Hx    Stomach cancer Neg Hx    Rectal cancer Neg Hx      Current Outpatient Medications:    acetaminophen (TYLENOL) 650 MG CR tablet, Take 650 mg by mouth every 8 (eight) hours as needed for pain., Disp: , Rfl:    albuterol (VENTOLIN HFA) 108 (90 Base) MCG/ACT inhaler, Inhale  1-2 puffs into the lungs every 6 (six) hours as needed., Disp: 8 g, Rfl: 2   ascorbic acid (VITAMIN C) 500 MG tablet, Take 500 mg by mouth daily., Disp: , Rfl:    aspirin EC 81 MG tablet, Take 81 mg by mouth daily. Swallow whole., Disp: , Rfl:    Benralizumab (FASENRA PEN) 30 MG/ML SOAJ, Inject 1 mL (30 mg total) into the skin every 8 (eight) weeks., Disp: 1 mL, Rfl: 2   budesonide (ENTOCORT EC) 3 MG 24 hr capsule, TAKE 1 CAPSULE (3 MG TOTAL)  BY MOUTH DAILY., Disp: 30 capsule, Rfl: 1   Calcium-Magnesium-Vitamin D (CALCIUM MAGNESIUM PO), Take 1 tablet by mouth at bedtime., Disp: , Rfl:    Cholecalciferol (VITAMIN D3 ULTRA STRENGTH) 125 MCG (5000 UT) capsule, Take 5,000 Units by mouth daily., Disp: , Rfl:    [START ON 05/04/2023] clonazePAM (KLONOPIN) 0.5 MG tablet, Take 1 tablet (0.5 mg total) by mouth daily., Disp: 30 tablet, Rfl: 2   denosumab (PROLIA) 60 MG/ML SOSY injection, Inject 60 mg into the skin every 6 (six) months., Disp: , Rfl:    doxepin (SINEQUAN) 25 MG capsule, Take 1 capsule (25 mg total) by mouth at bedtime., Disp: 30 capsule, Rfl: 3   Evolocumab (REPATHA SURECLICK) 140 MG/ML SOAJ, Inject 140 mg into the skin every 14 (fourteen) days., Disp: 2 mL, Rfl: 11   famotidine (PEPCID) 20 MG tablet, TAKE 1 TABLET BY MOUTH TWICE A DAY, Disp: 60 tablet, Rfl: 11   ibuprofen (ADVIL) 200 MG tablet, Take 400 mg by mouth every 6 (six) hours as needed for moderate pain., Disp: , Rfl:    ipratropium-albuterol (DUONEB) 0.5-2.5 (3) MG/3ML SOLN, Take 3 mLs by nebulization every 6 (six) hours as needed., Disp: 120 mL, Rfl: 5   nitroGLYCERIN (NITROSTAT) 0.4 MG SL tablet, Place 1 tablet (0.4 mg total) under the tongue every 5 (five) minutes as needed for chest pain., Disp: 25 tablet, Rfl: 11   oxyCODONE (ROXICODONE) 5 MG immediate release tablet, Take 1 tablet (5 mg total) by mouth every 6 (six) hours as needed for severe pain., Disp: 10 tablet, Rfl: 0   pindolol (VISKEN) 5 MG tablet, Take 1 tablet (5 mg total) by mouth daily after breakfast., Disp: 30 tablet, Rfl: 3   risperiDONE (RISPERDAL) 0.5 MG tablet, Take 1 tablet (0.5 mg total) by mouth 2 (two) times daily. Take 1 tablet (0.5 mg) in the morning and half a tablet (0.25 mg) at 6-7pm, Disp: 60 tablet, Rfl: 2   tamoxifen (NOLVADEX) 20 MG tablet, Take 1 tablet (20 mg total) by mouth daily., Disp: 30 tablet, Rfl: 3   venlafaxine XR (EFFEXOR XR) 75 MG 24 hr capsule, Take 3 capsules (225 mg  total) by mouth daily with breakfast., Disp: 90 capsule, Rfl: 1  Current Facility-Administered Medications:    [START ON 05/10/2023] denosumab (PROLIA) injection 60 mg, 60 mg, Subcutaneous, Once, Bradd Canary, MD      Objective:   Vitals:   04/28/23 1026  BP: 99/65  Pulse: 69  SpO2: 100%  Weight: 168 lb 3.2 oz (76.3 kg)  Height: 5\' 1"  (1.549 m)    Estimated body mass index is 31.78 kg/m as calculated from the following:   Height as of this encounter: 5\' 1"  (1.549 m).   Weight as of this encounter: 168 lb 3.2 oz (76.3 kg).  @WEIGHTCHANGE @  Filed Weights   04/28/23 1026  Weight: 168 lb 3.2 oz (76.3 kg)     Physical Exam  General: No distress. Look swell O2 at rest: no Cane present: no Sitting in wheel chair: no Frail: no Obese: non Neuro: Alert and Oriented x 3. GCS 15. Speech normal Psych: Pleasant Resp:  Barrel Chest - o.  Wheeze - no, Crackles - no, No overt respiratory distress CVS: Normal heart sounds. Murmurs - no Ext: Stigmata of Connective Tissue Disease - no HEENT: Normal upper airway. PEERL +. No post nasal drip        Assessment:       ICD-10-CM   1. Severe persistent asthma with intensive monitoring  J45.50     2. Hiatal hernia  K44.9     3. DOE (dyspnea on exertion)  R06.09          Plan:     Patient Instructions  Severe persistent asthma with intensive monitoring A B P A-ALLERGIC BRONCHOPULMONARY ASPERGILLOSIS Moderate persistent asthmatic bronchitis with acute exacerbation  -Asthma is well controlled  Plan -continue with Fasenra  - Retstart asmanex low dose 2 puff twice daily  - CMA to do refill 04/28/2023 if it was not done iin July 2024 - continue albuterol as needed - STOP PINDOLOL in setting of asthma - talk to your psychiatrist about it  - flu and RSV vaccines in the fall 2024   Dyspnea on Exertion  - likely due to stiff heart muscle (on echo 2024, with normal stress test), weight gain and physical deconditioning  along with back pain - less likely due o lung disease   Plan  - monitoring approach  Followup 6 months or sooner if needed   FOLLOWUP Return in about 6 months (around 10/26/2023) for 15 min visit, with Dr Marchelle Gearing, Face to Face Visit.    SIGNATURE    Dr. Kalman Shan, M.D., F.C.C.P,  Pulmonary and Critical Care Medicine Staff Physician, [facility] Director - Interstitial Lung Disease  Program  Pulmonary Fibrosis [facility] at [facility] [place], [place], [address]  Pager: [phone], If no answer or between  15:00h - 7:00h: call [phone] Telephone: [phone]  2:02 PM 04/28/2023

## 2023-04-28 ENCOUNTER — Encounter: Payer: Self-pay | Admitting: Internal Medicine

## 2023-04-28 ENCOUNTER — Ambulatory Visit: Payer: PPO | Admitting: Internal Medicine

## 2023-04-28 ENCOUNTER — Encounter (HOSPITAL_COMMUNITY): Payer: Self-pay | Admitting: Psychiatry

## 2023-04-28 VITALS — BP 99/65 | HR 69 | Ht 61.0 in | Wt 168.2 lb

## 2023-04-28 DIAGNOSIS — J4550 Severe persistent asthma, uncomplicated: Secondary | ICD-10-CM

## 2023-04-28 DIAGNOSIS — K449 Diaphragmatic hernia without obstruction or gangrene: Secondary | ICD-10-CM

## 2023-04-28 DIAGNOSIS — R0609 Other forms of dyspnea: Secondary | ICD-10-CM | POA: Diagnosis not present

## 2023-04-29 LAB — VITAMIN D 1,25 DIHYDROXY
Vitamin D 1, 25 (OH)2 Total: 45 pg/mL (ref 18–72)
Vitamin D2 1, 25 (OH)2: <8 pg/mL
Vitamin D3 1, 25 (OH)2: 45 pg/mL

## 2023-04-30 ENCOUNTER — Encounter: Payer: Self-pay | Admitting: Family Medicine

## 2023-05-01 ENCOUNTER — Other Ambulatory Visit: Payer: Self-pay | Admitting: Family Medicine

## 2023-05-01 MED ORDER — WEGOVY 0.25 MG/0.5ML ~~LOC~~ SOAJ: 0.2500 mg | SUBCUTANEOUS | 1 refills | Status: DC

## 2023-05-02 DIAGNOSIS — M4120 Other idiopathic scoliosis, site unspecified: Secondary | ICD-10-CM | POA: Diagnosis not present

## 2023-05-02 DIAGNOSIS — M542 Cervicalgia: Secondary | ICD-10-CM | POA: Diagnosis not present

## 2023-05-02 DIAGNOSIS — M545 Low back pain, unspecified: Secondary | ICD-10-CM | POA: Diagnosis not present

## 2023-05-02 NOTE — Telephone Encounter (Signed)
Received signed patient form from patient for Fasenra PAP renewal. Placed in PAP pending info folder awaiting return of MD form from Dr. Sherene Sires, PharmD, MPH, BCPS, CPP Clinical Pharmacist (Rheumatology and Pulmonology)

## 2023-05-04 ENCOUNTER — Encounter (HOSPITAL_COMMUNITY): Payer: Self-pay

## 2023-05-04 ENCOUNTER — Telehealth (HOSPITAL_COMMUNITY): Payer: Self-pay | Admitting: Psychiatry

## 2023-05-04 ENCOUNTER — Encounter: Payer: Self-pay | Admitting: Cardiology

## 2023-05-04 DIAGNOSIS — F32A Depression, unspecified: Secondary | ICD-10-CM

## 2023-05-04 MED ORDER — CLONAZEPAM 1 MG PO TABS: 1.0000 mg | ORAL_TABLET | Freq: Every day | ORAL | 0 refills | Status: DC

## 2023-05-04 MED ORDER — RISPERIDONE 1 MG PO TABS: 1.0000 mg | ORAL_TABLET | Freq: Every day | ORAL | 0 refills | Status: DC

## 2023-05-04 NOTE — Telephone Encounter (Addendum)
Patient was contacted via telephone for approximately 20 minutes.   Roxanna reports that she has continued to feel this increased anxiety and depression in the mornings and feels as if she had down played it during her appointment. Of note there was a decline after looking at a few group homes and other living arrangements for her son which were not good options. At this point she is feeling more hopeless about what is going to happen in [name]'s future. She denies any SI or thoughts of self harm. She is feeling like she is reaching the end of her rope in the ability to keep caring for him. While he is not at his worst currently he is sick with pneumonia and is up most of the night. Her husband has been staying up to help allowing Crystal to sleep.   We discussed medication options to try and manage the increase in depression and anxiety. Patient has already maximized Effexor and noted adverse side effects from the Remeron. Her blood pressure is low and she has asthma making titration of pindolol unfeasible. That said the benefit of the current dose outweighs the potential risk to her asthma and the patient denies any notable change in her breathing. We will titrate Risperdal to 1 mg at bedtime today to adjunct mood management. Will also temporarily increase morning Klonopin to 1 mg daily for the next 3 weeks. We will reassess at that time.   Patient will also increase therapy to every other week and is open to considering the [facility] program. She does not feel inpatient hospitalization is needed at this time.

## 2023-05-04 NOTE — Telephone Encounter (Signed)
Pt next visit 06/02/23

## 2023-05-04 NOTE — Telephone Encounter (Signed)
Received signed MDO form. Scanned this form, patient form, med list, and insurance card copy to [facility] pending PA approval letter  Submitted a Prior Authorization request to [facility] ADVANT[employer]/RX [employer]VANCE for [facility] via CoverMyMeds. Will update once we receive a response.  Key: [id number]   Chesley Mires, PharmD, MPH, BCPS, CPP Clinical Pharmacist (Rheumatology and Pulmonology)

## 2023-05-08 DIAGNOSIS — M25552 Pain in left hip: Secondary | ICD-10-CM | POA: Diagnosis not present

## 2023-05-08 NOTE — Telephone Encounter (Signed)
Received notification from [facility] ADVANTAGE/RX ADVANCE regarding a prior authorization for [facility]. Authorization has been APPROVED from 05/04/23 to 11/01/23. Approval letter sent to scan center.  Authorization # [id number] Phone # [phone]  Scanned entire Fasenra PAP renewal application to Onbase for submission after 06/21/2023 once grant denial screenshots are collected (these must be dated on or after 06/21/2023)  Chesley Mires, PharmD, MPH, BCPS, CPP Clinical Pharmacist (Rheumatology and Pulmonology)

## 2023-05-09 DIAGNOSIS — M545 Low back pain, unspecified: Secondary | ICD-10-CM | POA: Diagnosis not present

## 2023-05-09 DIAGNOSIS — M4120 Other idiopathic scoliosis, site unspecified: Secondary | ICD-10-CM | POA: Diagnosis not present

## 2023-05-09 DIAGNOSIS — M542 Cervicalgia: Secondary | ICD-10-CM | POA: Diagnosis not present

## 2023-05-15 ENCOUNTER — Telehealth: Payer: Self-pay | Admitting: Family Medicine

## 2023-05-15 NOTE — Telephone Encounter (Signed)
CAM order placed.

## 2023-05-15 NOTE — Telephone Encounter (Signed)
Last Prolia 12/06/22 Pt due 06/08/23

## 2023-05-15 NOTE — Telephone Encounter (Signed)
Pt called to schedule an appt for Prolia Injection. Please advise when and if pt can be scheduled.

## 2023-05-16 DIAGNOSIS — M545 Low back pain, unspecified: Secondary | ICD-10-CM | POA: Diagnosis not present

## 2023-05-16 DIAGNOSIS — M4120 Other idiopathic scoliosis, site unspecified: Secondary | ICD-10-CM | POA: Diagnosis not present

## 2023-05-16 DIAGNOSIS — M25552 Pain in left hip: Secondary | ICD-10-CM | POA: Diagnosis not present

## 2023-05-16 DIAGNOSIS — M542 Cervicalgia: Secondary | ICD-10-CM | POA: Diagnosis not present

## 2023-05-22 NOTE — Progress Notes (Unsigned)
BH MD/PA/NP OP Progress Note  05/23/2023 4:57 PM [name]  MRN:  [id number]  Visit Diagnosis:    ICD-10-CM   1. Anxiety and depression  F41.9 clonazePAM (KLONOPIN) 0.5 MG tablet   F32.A metFORMIN (GLUCOPHAGE) 500 MG tablet    risperiDONE (RISPERDAL) 0.5 MG tablet    venlafaxine XR (EFFEXOR XR) 75 MG 24 hr capsule    2. OSA (obstructive sleep apnea)  G47.33       Assessment: Victoria [name] is a 67 y.o. female with a history of anxiety, depression, OSA who presented to [facility] at [facility] for initial evaluation on 06/30/2022.  At initial evaluation patient reported symptoms of anxiety including feeling nervous or on edge, being unable to stop controlled worrying, worrying too much about different things, and fear that something awful would happen.  Patient's symptoms have been gradually progressing.  There was a period in November 2023 where she was having passive SI without intent or plan.  Patient also did endorse low mood, poor sleep, difficulty concentrating.  She does have a history of obstructive sleep apnea and does not use CPAP with last sleep study being over 10 years ago. Psychosocially patient has increased stress of caring for her adult son with cerebral palsy and planning the next steps for him after she and her husband pass or are no longer able to care for him.  Patient met criteria for MDD and generalized anxiety disorder. Of note on exam patient was found to have smacking of her lips repetitive movements of her mouth which are concerning for parkinsonism secondary to Zyprexa.     [name] presents for follow-up evaluation. Today, 05/23/23, patient reports ***  Victoria [name] presents for follow-up evaluation. Today, 04/28/23, patient reports being unable to tolerate the decrease in Risperdal due to worsening anxiety, restlessness, insomnia, and negative intrusive thoughts.  This lasts around a week until she restarted the Risperdal at 0.5 twice  daily.  Of note there was some residual restlessness in the mornings and patient adjusted the timing of the Klonopin and pindolol dosing from breakfast to when she first wakes up in the morning.  At this time we would recommend continuing on this regimen.  As weight loss is still a concern we discussed some strategies to help manage that.  Patient had also talked to her PCP about [facility] and we reviewed the risk and benefits of this medication from a psychiatric standpoint.  Would be appropriate to give this a trial from a psychiatric standpoint if PCP is in agreement and we can monitor for any change in mood symptoms.  Plan: - Continue Effexor XR 225 mg QD - Continue Risperdal 0.5 mg Qam and 1 mg QHS  - Hold Doxepin 25 mg QHS - Taper klonopin 0.5 mg QD prn for anxiety - Continue pindolol 5 mg QD - Recommend repeat sleep study, could consider dental devices OSA still present - CMP, CBC, lipid profile, Vit D, A1c, TSH reviewed - Continue with therapy every week through [place] therapy - Completed a 10 week wellness group through the [facility], followed by a group for special needs parents - Discussed other support groups - Crisis resources reviewed - Follow up in a month   Chief Complaint:  Chief Complaint  Patient presents with   Follow-up   HPI: Methyl had reached out shortly after her last appointment reporting that she is experiencing increased anxiety and depression in the mornings which she is not finding manageable.  The change since her appointment a few days earlier appears to be related to a lack of good group home options for her son.  This things they had been considering they found to not be viable options.  Patient began to feel more hopeless about him's future along with feeling a growing inability to continue to manage her son.  Education and therapy options were discussed during the phone call.  Patient was already maximized on Effexor and had adverse side effects from mirtazapine.   Pindolol titration was unfeasible due to her low blood pressure and history of asthma.  As she has noticed benefit from Risperdal we decided to increase the medication at that time.  As weight gain has already been a concern we did discuss this and encouraged patient to reach out to her PCP about options.  Also discussed the potential addition of metformin if patient is unable to start GLP-1 agonist.  Patient was also started on a short term course of Klonopin 1 mg in the mornings for anxiety.  She dictated a plan to increase the frequency of her therapy appointments.  We also had discussed the partial hospitalization program which patient was open to considering.  On presentation today Victoria [name] reported that she is alright. She feels that she is doing pretty good since we talked in the interim. After starting the Risperdal increased dose she felt more normal like herself. She has been feeling better in the mornings up until the past couple days. This afternoon she is feeling much better. Victoria [name] feels that the past couple days may be more related to fatigue and doing more lately.   She does sleep more since the Risperdal and      Past Psychiatric History: One  prior psychiatric hospitalization to [facility] in February of 2024 due to increased depression/anxiety. No prior suicide attempts.  Patient did endorse passive SI in November of 2023 without any intent or plan.  She denies being connected with a psychiatrist in the past and had recently connected with a therapist at [facility] therapy.  Has taken Effexor (possible restlessness at 225), Cymbalta, Remeron, Ativan, Xanax, Klonopin, gabapentin, BuSpar, Zyprexa (TD), Latuda (akathisia), Risperdal, Doxepin,   Denies substance use other than 1 beer a day. Can increase to 2 when the weather is nice.  Past Medical History:  Past Medical History:  Diagnosis Date   Allergic bronchopulmonary aspergillosis (HCC) 2008   sees Dr Wenda Overland pulmonology   Anemia     iron deficiency, resolved   Anxiety    Asthma    Breast cancer (HCC)    CAD (coronary artery disease)    a. LHC 6/16:  oOM1 60, pRCA 25 >> med Rx b. cath 3/19 2nd OM with 95% stenosis s/p synergy DES & anomalous RCA   CAP (community acquired pneumonia) 2016; 06/07/2016   Chronic bronchitis (HCC)    Chronic lower back pain    Complication of anesthesia    "think I have a hard time waking up from it"   Depression    mild   Diverticulitis    Diverticulosis    GERD (gastroesophageal reflux disease)    H/O hiatal hernia    Headache    "weekly" (08/23/2017)   History of echocardiogram    Echo 6/16:  Mod LVH, EF 60-65%, no RWMA, Gr 1 DD, trivial MR, normal LA size.   History of radiation therapy    Left breast- 12/26/22-01/23/23-Dr. Antony Blackbird   Hyperglycemia 11/20/2015   Hyperlipidemia, mixed 09/11/2007  Qualifier: Diagnosis of  By: Janit Bern   Did not tolerate Lipitor, zocor, Lovastatin, Pravastatin, Livalo, Crestor even low dose    IBS (irritable bowel syndrome)    Maxillary sinusitis    Normal cardiac stress test 11/2011   No evidence of ischemia or infarct.   Calculated ejection fraction 72%.   Obesity    OSA (obstructive sleep apnea) 02/2012   has stopped using  cpap   Osteoarthritis    Osteoporosis    Personal history of radiation therapy    Pneumonia 11/2011   "before 2013 I hadn't had pneumonia since I was a child" (04/13/2012)   Pulmonary nodules    S/P angioplasty with stent 08/23/17 ostial 2nd OM with DES synnergy 08/24/2017   Schatzki's ring     Past Surgical History:  Procedure Laterality Date   ANTERIOR AND POSTERIOR REPAIR N/A 10/18/2022   Procedure: ANTERIOR (CYSTOCELE);  Surgeon: Marcelle Overlie, MD;  Location: [facility] OR;  Service: Gynecology;  Laterality: N/A;   APPENDECTOMY  1989   BREAST BIOPSY  11/03/2022   MM LT RADIOACTIVE SEED LOC MAMMO GUIDE 11/03/2022 [facility] MAMMOGRAPHY   BREAST LUMPECTOMY WITH RADIOACTIVE SEED LOCALIZATION Left 11/04/2022    Procedure: LEFT BREAST LUMPECTOMY WITH RADIOACTIVE SEED LOCALIZATION;  Surgeon: Griselda Miner, MD;  Location: [facility];  Service: General;  Laterality: Left;   CARDIAC CATHETERIZATION N/A 11/25/2014   Procedure: Right/Left Heart Cath and Coronary Angiography;  Surgeon: Lyn Records, MD;  Location: [facility] INVASIVE CV LAB;  Service: Cardiovascular;  Laterality: N/A;   CESAREAN SECTION  1985   COLONOSCOPY  03/2022   CORONARY ANGIOPLASTY WITH STENT PLACEMENT  08/23/2017   CORONARY STENT INTERVENTION N/A 08/23/2017   Procedure: CORONARY STENT INTERVENTION;  Surgeon: Kathleene Hazel, MD;  Location: [facility] INVASIVE CV LAB;  Service: Cardiovascular;  Laterality: N/A;   HERNIA REPAIR  04/13/2012   VHR laparoscopic   LAPAROSCOPIC BILATERAL SALPINGO OOPHERECTOMY Bilateral 10/18/2022   Procedure: LAPAROTOMY WITH BILATERAL SALPINGO OOPHORECTOMY;  Surgeon: Marcelle Overlie, MD;  Location: [facility] OR;  Service: Gynecology;  Laterality: Bilateral;   LEFT HEART CATH AND CORONARY ANGIOGRAPHY N/A 08/23/2017   Procedure: LEFT HEART CATH AND CORONARY ANGIOGRAPHY;  Surgeon: Kathleene Hazel, MD;  Location: [facility] INVASIVE CV LAB;  Service: Cardiovascular;  Laterality: N/A;   VAGINAL HYSTERECTOMY N/A 10/18/2022   Procedure: HYSTERECTOMY VAGINAL;  Surgeon: Marcelle Overlie, MD;  Location: [facility] OR;  Service: Gynecology;  Laterality: N/A;   VENTRAL HERNIA REPAIR  04/13/2012   Procedure: LAPAROSCOPIC VENTRAL HERNIA;  Surgeon: [name], MD;  Location: [facility] OR;  Service: General;  Laterality: N/A;  laparoscopic repair of incarcerated hernia   Family History:  Family History  Problem Relation Age of Onset   Breast cancer Mother 30       bilateral breast cancer dx. 51, met to liver   Hypertension Mother    Diabetes Mother    Diverticulosis Father    Prostate cancer Father    Breast cancer Sister 33       DCIS at 56, IDC at 63, PALB2+   Cancer Sister        breast cancer, invasive ductal carcinoma  in 2022,DCIS at 64 with 4 weeks of radiation, 5 years of Tamoxifen    Pulmonary embolism Brother        recurrent   Heart attack Maternal Grandfather    Ovarian cancer Paternal Grandmother    Cerebral palsy Son    Prostate cancer Paternal Uncle  Breast cancer Niece 53       PALB2+   Osteoporosis Niece    Stroke Neg Hx    Colon cancer Neg Hx    Esophageal cancer Neg Hx    Stomach cancer Neg Hx    Rectal cancer Neg Hx     Social History:  Social History   Socioeconomic History   Marital status: Married    Spouse name: Not on file   Number of children: 1   Years of education: 14   Highest education level: Associate degree: academic program  Occupational History   Occupation: Disabled   Tobacco Use   Smoking status: Never    Passive exposure: Never   Smokeless tobacco: Never  Vaping Use   Vaping status: Never Used  Substance and Sexual Activity   Alcohol use: Yes    Alcohol/week: 4.0 - 7.0 standard drinks of alcohol    Types: 4 - 7 Standard drinks or equivalent per week   Drug use: No   Sexual activity: Yes    Birth control/protection: Surgical    Comment: gluten free, lives with husband and son with CP quadriplegia  Other Topics Concern   Not on file  Social History Narrative   Cares for son with cerebral palsy.    Lives at home with her husband and son.   Right-handed.   2 cups caffeine per day.   One story home   Social Determinants of Health   Financial Resource Strain: Low Risk  (01/18/2023)   Overall Financial Resource Strain (CARDIA)    Difficulty of Paying Living Expenses: Not hard at all  Food Insecurity: No Food Insecurity (01/18/2023)   Hunger Vital Sign    Worried About Running Out of Food in the Last Year: Never true    Ran Out of Food in the Last Year: Never true  Transportation Needs: No Transportation Needs (10/24/2022)   PRAPARE - Administrator, Civil Service (Medical): No    Lack of Transportation (Non-Medical): No  Physical  Activity: Inactive (02/10/2023)   Exercise Vital Sign    Days of Exercise per Week: 0 days    Minutes of Exercise per Session: 0 min  Stress: Stress Concern Present (01/18/2023)   [employer] of Occupational Health - Occupational Stress Questionnaire    Feeling of Stress : To some extent  Social Connections: Unknown (01/18/2023)   Social Connection and Isolation Panel [NHANES]    Frequency of Communication with Friends and Family: Three times a week    Frequency of Social Gatherings with Friends and Family: Once a week    Attends Religious Services: More than 4 times per year    Active Member of [employer] or Organizations: Not on file    Attends Banker Meetings: Not on file    Marital Status: Married    Allergies:  Allergies  Allergen Reactions   Latuda [Lurasidone Hcl] Other (See Comments)    PER THE PT CAUSED RESTLESSNESS   Beclomethasone Dipropionate Hives and Other (See Comments)     weight gain   Flexeril [Cyclobenzaprine] Anxiety   Mometasone Furo-Formoterol Fum Hives and Other (See Comments)    weight gain   Sulfonamide Derivatives Hives and Rash   Statins Other (See Comments)    Myalgias, RLS    Current Medications: Current Outpatient Medications  Medication Sig Dispense Refill   metFORMIN (GLUCOPHAGE) 500 MG tablet Take 1 tablet (500 mg total) by mouth 2 (two) times daily with a meal. 60 tablet  2   acetaminophen (TYLENOL) 650 MG CR tablet Take 650 mg by mouth every 8 (eight) hours as needed for pain.     albuterol (VENTOLIN HFA) 108 (90 Base) MCG/ACT inhaler Inhale 1-2 puffs into the lungs every 6 (six) hours as needed. 8 g 2   ascorbic acid (VITAMIN C) 500 MG tablet Take 500 mg by mouth daily.     aspirin EC 81 MG tablet Take 81 mg by mouth daily. Swallow whole.     Benralizumab (FASENRA PEN) 30 MG/ML SOAJ Inject 1 mL (30 mg total) into the skin every 8 (eight) weeks. 1 mL 2   budesonide (ENTOCORT EC) 3 MG 24 hr capsule TAKE 1 CAPSULE (3 MG TOTAL) BY  MOUTH DAILY. 30 capsule 1   Calcium-Magnesium-Vitamin D (CALCIUM MAGNESIUM PO) Take 1 tablet by mouth at bedtime.     Cholecalciferol (VITAMIN D3 ULTRA STRENGTH) 125 MCG (5000 UT) capsule Take 5,000 Units by mouth daily.     clonazePAM (KLONOPIN) 0.5 MG tablet Take 1 tablet (0.5 mg total) by mouth daily. Take 1 mg tab each morning for 3 weeks until next appointment 30 tablet 1   denosumab (PROLIA) 60 MG/ML SOSY injection Inject 60 mg into the skin every 6 (six) months.     doxepin (SINEQUAN) 25 MG capsule Take 1 capsule (25 mg total) by mouth at bedtime. 30 capsule 3   Evolocumab (REPATHA SURECLICK) 140 MG/ML SOAJ Inject 140 mg into the skin every 14 (fourteen) days. 2 mL 11   famotidine (PEPCID) 20 MG tablet TAKE 1 TABLET BY MOUTH TWICE A DAY 60 tablet 11   ibuprofen (ADVIL) 200 MG tablet Take 400 mg by mouth every 6 (six) hours as needed for moderate pain.     ipratropium-albuterol (DUONEB) 0.5-2.5 (3) MG/3ML SOLN Take 3 mLs by nebulization every 6 (six) hours as needed. 120 mL 5   nitroGLYCERIN (NITROSTAT) 0.4 MG SL tablet Place 1 tablet (0.4 mg total) under the tongue every 5 (five) minutes as needed for chest pain. 25 tablet 11   oxyCODONE (ROXICODONE) 5 MG immediate release tablet Take 1 tablet (5 mg total) by mouth every 6 (six) hours as needed for severe pain. 10 tablet 0   pindolol (VISKEN) 5 MG tablet Take 1 tablet (5 mg total) by mouth daily after breakfast. 30 tablet 3   risperiDONE (RISPERDAL) 0.5 MG tablet Take 1 tablet (0.5 mg total) by mouth daily. Take 1 mg Risperdal tab at bedtime 60 tablet 2   risperiDONE (RISPERDAL) 1 MG tablet Take 1 tablet (1 mg total) by mouth at bedtime. Take 0.5 mg tab in the morning 30 tablet 0   Semaglutide-Weight Management (WEGOVY) 0.25 MG/0.5ML SOAJ Inject 0.25 mg into the skin once a week. 2 mL 1   tamoxifen (NOLVADEX) 20 MG tablet Take 1 tablet (20 mg total) by mouth daily. 30 tablet 3   venlafaxine XR (EFFEXOR XR) 75 MG 24 hr capsule Take 3 capsules  (225 mg total) by mouth daily with breakfast. 90 capsule 1   Current Facility-Administered Medications  Medication Dose Route Frequency Provider Last Rate Last Admin   denosumab (PROLIA) injection 60 mg  60 mg Subcutaneous Once Bradd Canary, MD         Psychiatric Specialty Exam: Review of Systems  There were no vitals taken for this visit.There is no height or weight on file to calculate BMI.  General Appearance: {Appearance:22683}  Eye Contact:  {BHH EYE CONTACT:22684}  Speech:  {Speech:22685}  Volume:  {  Volume (PAA):22686}  Mood:  {BHH MOOD:22306}  Affect:  {Affect (PAA):22687}  Thought Process:  {Thought Process (PAA):22688}  Orientation:  {BHH ORIENTATION (PAA):22689}  Thought Content: {Thought Content:22690}   Suicidal Thoughts:  {ST/HT (PAA):22692}  Homicidal Thoughts:  {ST/HT (PAA):22692}  Memory:  {BHH MEMORY:22881}  Judgement:  {Judgement (PAA):22694}  Insight:  {Insight (PAA):22695}  Psychomotor Activity:  {Psychomotor (PAA):22696}  Concentration:  {Concentration:21399}  Recall:  {BHH GOOD/FAIR/POOR:22877}  Fund of Knowledge: {BHH GOOD/FAIR/POOR:22877}  Language: {BHH GOOD/FAIR/POOR:22877}  Akathisia:  {BHH YES OR NO:22294}    AIMS (if indicated): {Desc; done/not:10129}  Assets:  {Assets (PAA):22698}  ADL's:  {BHH UJW'J:19147}  Cognition: {chl bhh cognition:304700322}  Sleep:  {BHH GOOD/FAIR/POOR:22877}   Metabolic Disorder Labs: Lab Results  Component Value Date   HGBA1C 5.3 04/25/2023   MPG 117 10/22/2008   Lab Results  Component Value Date   PROLACTIN 15.4 08/12/2022   Lab Results  Component Value Date   CHOL 126 04/25/2023   TRIG 125.0 04/25/2023   HDL 63.20 04/25/2023   CHOLHDL 2 04/25/2023   VLDL 25.0 04/25/2023   LDLCALC 38 04/25/2023   LDLCALC 111 (H) 01/19/2023   Lab Results  Component Value Date   TSH 1.79 04/25/2023   TSH 1.76 01/19/2023    Therapeutic Level Labs: No results found for: "LITHIUM" No results found for:  "VALPROATE" No results found for: "CBMZ"   Screenings: AIMS    Flowsheet Row Admission (Discharged) from 08/12/2022 in [facility]  AIMS Total Score 2      AUDIT    Flowsheet Row Admission (Discharged) from 08/12/2022 in [facility] BE[facility]AVIORAL MEDIC[facility]  Alcohol Use Disorder Identification Test Final Score (AUDIT) 0      GAD-7    Flowsheet Row Video Visit from 10/13/2022 in [facility] at [facility] Office Visit from 06/30/2022 in [facility] Office Visit from 05/26/2022 in [facility] Care at [facility] Office Visit from 01/16/2017 in [facility] at [facility]  Total GAD-7 Score 0 11 11 3       PHQ2-9    Flowsheet Row Clinical Support from 02/10/2023 in [facility] Primary Care at [facility] Office Visit from 01/19/2023 in [facility] Primary Care at [facility] Video Visit from 10/13/2022 in [facility] Primary Care at [facility] Telephone from 08/19/2022 in [facility] Office Visit from 06/30/2022 in [facility]  PHQ-2 Total Score 0 0 0 2 3  PHQ-9 Total Score -- -- 0 -- 4      Flowsheet Row Admission (Discharged) from 11/04/2022 in [facility] Admission (Discharged) from 10/18/2022 in [facility] Pre-Admission Testing 60 from 10/13/2022 in [facility] PREADMISSION TESTING  C-SSRS RISK CATEGORY No Risk No Risk No Risk       Collaboration of Care: Collaboration of Care: Medication Management AEB medication prescription and Primary Care Provider AEB chart review  Patient/Guardian was advised Release of Information must be obtained prior to any record release in order to collaborate their care with an outside provider. Patient/Guardian was advised if they have not already done so to contact the  registration department to sign all necessary forms in order for [name] to release information regarding their care.   Consent: Patient/Guardian gives verbal consent for treatment and assignment of benefits for services provided during this visit. Patient/Guardian expressed understanding and agreed to proceed.  Stasia Cavalier, MD 05/23/2023, 4:57 PM   Virtual Visit via Video Note  I connected with [name] on 05/23/23 at  4:30 PM EST by a video enabled telemedicine application and verified that I am speaking with the correct person using two identifiers.  Location: Patient: Home Provider: Home Office   I discussed the limitations of evaluation and management by telemedicine and the availability of in person appointments. The patient expressed understanding and agreed to proceed.   I discussed the assessment and treatment plan with the patient. The patient was provided an opportunity to ask questions and all were answered. The patient agreed with the plan and demonstrated an understanding of the instructions.   The patient was advised to call back or seek an in-person evaluation if the symptoms worsen or if the condition fails to improve as anticipated.  I provided 20 minutes of non-face-to-face time during this encounter.   Stasia Cavalier, MD

## 2023-05-23 ENCOUNTER — Telehealth (HOSPITAL_COMMUNITY): Payer: PPO | Admitting: Psychiatry

## 2023-05-23 DIAGNOSIS — G4733 Obstructive sleep apnea (adult) (pediatric): Secondary | ICD-10-CM | POA: Diagnosis not present

## 2023-05-23 DIAGNOSIS — F32A Depression, unspecified: Secondary | ICD-10-CM

## 2023-05-23 DIAGNOSIS — F419 Anxiety disorder, unspecified: Secondary | ICD-10-CM

## 2023-05-23 MED ORDER — VENLAFAXINE HCL ER 75 MG PO CP24: 225.0000 mg | ORAL_CAPSULE | Freq: Every day | ORAL | 1 refills | Status: DC

## 2023-05-23 MED ORDER — CLONAZEPAM 0.5 MG PO TABS: 0.5000 mg | ORAL_TABLET | Freq: Every day | ORAL | 1 refills | Status: DC

## 2023-05-23 MED ORDER — METFORMIN HCL 500 MG PO TABS: 500.0000 mg | ORAL_TABLET | Freq: Two times a day (BID) | ORAL | 2 refills | Status: DC

## 2023-05-23 MED ORDER — RISPERIDONE 0.5 MG PO TABS: 0.5000 mg | ORAL_TABLET | Freq: Every day | ORAL | 2 refills | Status: DC

## 2023-05-24 ENCOUNTER — Telehealth: Payer: Self-pay | Admitting: Family Medicine

## 2023-05-24 ENCOUNTER — Encounter (HOSPITAL_COMMUNITY): Payer: Self-pay | Admitting: Psychiatry

## 2023-05-24 DIAGNOSIS — M545 Low back pain, unspecified: Secondary | ICD-10-CM | POA: Diagnosis not present

## 2023-05-24 DIAGNOSIS — M542 Cervicalgia: Secondary | ICD-10-CM | POA: Diagnosis not present

## 2023-05-24 DIAGNOSIS — M4120 Other idiopathic scoliosis, site unspecified: Secondary | ICD-10-CM | POA: Diagnosis not present

## 2023-05-24 NOTE — Telephone Encounter (Signed)
Patient would like an updated status as she states is due for injection. Please cal

## 2023-05-24 NOTE — Telephone Encounter (Signed)
Pt notified that we are awaiting on insurance verification and then I will call her.  She is due on 06/08/23.

## 2023-05-24 NOTE — Telephone Encounter (Signed)
Patient would like to know status of the [facility]. Please call

## 2023-05-25 NOTE — Telephone Encounter (Signed)
Called [facility] and had them run the rx and they stated that product is not on formulary.   Spoke with pt and advised her of this and advised that she calls her insurance to see if they even cover or she can wait til new year.

## 2023-05-26 NOTE — Telephone Encounter (Signed)
Just checking on PA for this Prolia.

## 2023-05-29 ENCOUNTER — Other Ambulatory Visit (HOSPITAL_COMMUNITY): Payer: Self-pay | Admitting: Psychiatry

## 2023-05-29 DIAGNOSIS — F32A Depression, unspecified: Secondary | ICD-10-CM

## 2023-05-31 DIAGNOSIS — M5459 Other low back pain: Secondary | ICD-10-CM | POA: Diagnosis not present

## 2023-05-31 DIAGNOSIS — M4125 Other idiopathic scoliosis, thoracolumbar region: Secondary | ICD-10-CM | POA: Diagnosis not present

## 2023-06-02 ENCOUNTER — Telehealth (HOSPITAL_COMMUNITY): Payer: PPO | Admitting: Psychiatry

## 2023-06-02 DIAGNOSIS — M25552 Pain in left hip: Secondary | ICD-10-CM | POA: Diagnosis not present

## 2023-06-05 ENCOUNTER — Other Ambulatory Visit (HOSPITAL_COMMUNITY): Payer: Self-pay

## 2023-06-06 DIAGNOSIS — M542 Cervicalgia: Secondary | ICD-10-CM | POA: Diagnosis not present

## 2023-06-06 DIAGNOSIS — M545 Low back pain, unspecified: Secondary | ICD-10-CM | POA: Diagnosis not present

## 2023-06-06 DIAGNOSIS — M4120 Other idiopathic scoliosis, site unspecified: Secondary | ICD-10-CM | POA: Diagnosis not present

## 2023-06-06 NOTE — Telephone Encounter (Signed)
Patient scheduled for 06/08/23

## 2023-06-07 NOTE — Progress Notes (Signed)
Victoria [name] is a 67 y.o. female presents to the office today for Prolia injection. Prolia was administered L arm today. Patient tolerated injection. Patient next injection due: 6 months, appt made: No- will schedule in 5 months after benifits are ran again Initial injection: NO Patient supplied: NO   Creft, Melton Alar L

## 2023-06-08 ENCOUNTER — Ambulatory Visit (INDEPENDENT_AMBULATORY_CARE_PROVIDER_SITE_OTHER): Payer: PPO

## 2023-06-08 ENCOUNTER — Telehealth: Payer: Self-pay

## 2023-06-08 DIAGNOSIS — M810 Age-related osteoporosis without current pathological fracture: Secondary | ICD-10-CM

## 2023-06-08 MED ORDER — DENOSUMAB 60 MG/ML ~~LOC~~ SOSY
60.0000 mg | PREFILLED_SYRINGE | Freq: Once | SUBCUTANEOUS | Status: AC
  Administered 2023-12-12: 60 mg via SUBCUTANEOUS

## 2023-06-08 NOTE — Telephone Encounter (Signed)
Pt received her prolia today.  Next due on/after 12/07/2023 CAM placed.

## 2023-06-19 ENCOUNTER — Encounter (HOSPITAL_COMMUNITY): Payer: Self-pay

## 2023-06-22 NOTE — Telephone Encounter (Signed)
 Doctor, general practice. Submitted Patient Assistance RENEWAL Application to [place] for FASEN[place]  along with provider portion, patient portion, PA, medication list, insurance card copy and dated grant closure screenshots. Will update patient when we receive a response.  Phone #: [phone] Fax #: [phone]

## 2023-06-23 ENCOUNTER — Other Ambulatory Visit: Payer: Self-pay | Admitting: Hematology

## 2023-06-23 DIAGNOSIS — M542 Cervicalgia: Secondary | ICD-10-CM | POA: Diagnosis not present

## 2023-06-23 DIAGNOSIS — M545 Low back pain, unspecified: Secondary | ICD-10-CM | POA: Diagnosis not present

## 2023-06-23 DIAGNOSIS — M4120 Other idiopathic scoliosis, site unspecified: Secondary | ICD-10-CM | POA: Diagnosis not present

## 2023-06-26 ENCOUNTER — Telehealth: Payer: Self-pay | Admitting: Hematology

## 2023-06-27 ENCOUNTER — Inpatient Hospital Stay: Payer: PPO | Admitting: Hematology

## 2023-06-27 ENCOUNTER — Inpatient Hospital Stay: Payer: PPO

## 2023-06-28 NOTE — Progress Notes (Signed)
 BH MD/PA/NP OP Progress Note  06/30/2023 11:02 AM Victoria [name]  MRN:  [id number]  Visit Diagnosis:    ICD-10-CM   1. Anxiety and depression  F41.9 risperiDONE  (RISPERDAL ) 1 MG tablet   F32.A pindolol  (VISKEN ) 5 MG tablet    doxepin  (SINEQUAN ) 25 MG capsule    clonazePAM  (KLONOPIN ) 0.5 MG tablet       Assessment: Victoria [name] is a 68 y.o. female with a history of anxiety, depression, OSA who presented to [facility] for initial evaluation on 06/30/2022.  At initial evaluation patient reported symptoms of anxiety including feeling nervous or on edge, being unable to stop controlled worrying, worrying too much about different things, and fear that something awful would happen.  Patient's symptoms have been gradually progressing.  There was a period in November 2023 where she was having passive SI without intent or plan.  Patient also did endorse low mood, poor sleep, difficulty concentrating.  She does have a history of obstructive sleep apnea and does not use CPAP with last sleep study being over 10 years ago. Psychosocially patient has increased stress of caring for her adult son with cerebral palsy and planning the next steps for him after she and her husband pass or are no longer able to care for him.  Patient met criteria for MDD and generalized anxiety disorder. Of note on exam patient was found to have smacking of her lips repetitive movements of her mouth which are concerning for parkinsonism secondary to Zyprexa .     Victoria [name] presents for follow-up evaluation. Today, 06/30/23, patient reports that her mood and anxiety have been stable.  She has tolerated the initiation of metformin  and taper of Klonopin  well.  She did need to take the 1 mg dose of Klonopin  a few days before being able to transition to the half milligram.  The only concerns at this time are the restlessness that she has been experiencing for an extended period and the new onset of  itchiness a couple weeks ago.  We did evaluate the itchiness and are uncertain of its origin.  It is possible that the decrease in Klonopin  has increased anxiety and in turn somatic symptoms, or that this is a side effect from the metformin .  Neither is strongly suspect as the symptoms began several weeks after adjusting both medications.  For now we will continue to monitor.  As for the restlessness is likely related to her anxiety and former diagnosis of restless leg syndrome.  We did discuss gabapentin  however patient had been unable to tolerate this in the past.  For now we will continue on her current regimen and follow-up in a month.   Plan: - Continue Effexor  XR 225 mg QD - Continue Risperdal  0.5 mg Qam and 1 mg QHS  - Continue Doxepin  25 mg QHS - Taper klonopin  0.5 mg QD prn for anxiety - Continue pindolol  5 mg every day - Continue metformin  500 mg BID - Recommend repeat sleep study, could consider dental devices OSA still present - CMP, CBC, lipid profile, Vit D, A1c, TSH reviewed - Continue with therapy every week through [place] [facility] - Completed a 10 week wellness group through the [facility], followed by a group for special needs parents - Discussed other support groups - Crisis resources reviewed - Follow up in a month  Chief Complaint:  Chief Complaint  Patient presents with   Follow-up   HPI: On presentation today Victoria [name] reported that she is  pretty good.  Both her mood and anxiety have been fairly stable.  She had a good holiday with her family and then her sister came over after christmas. Victoria [name] has also been doing well during this time without getting sick. They have another lead for a place where Victoria [name] may be able to go. They are going to take a look at the house this weekend. She is hopeful about this and that it could be a good fit.    Patient is still experiencing the rocking and occasional restlessness in her legs which is worse in the morning and tends to improve throughout the  day.  We discussed this and how the pindolol  had led to some improvement however she is unable to tolerate higher doses due to blood pressure concerns.  We also discussed restless leg syndrome which patient reports that she has been diagnosed with in the past.  She had tried gabapentin  however could not tolerate it has made her anxiety worse.  We reviewed the symptoms and did note that anxiety may be feeling a piece and as the situation with him improves the anxiety will also likely improve as well.  Then the also noticed the onset of itching a couple weeks ago.  It started in the crook of her arm and progressed up to her shoulder and chest.  She denies any associated rash with this.  Patient does not necessarily correlate this with any of the medication changes.  The symptoms began several weeks after she started the metformin  and decreased the Klonopin .  That said we will not rule out either as potential causes whether it is a side effect of the metformin  or increased anxiety from going down the Klonopin .  Patient will continue to monitor this and better document whether symptoms improve throughout the day after she takes the Klonopin .  She will also pay attention to see if anything else has changed in her life recently that could be contributing to the itchiness.  In regards to the metformin  she has still gained a couple pounds of weight.  We agreed to give it a longer trial to see if it improved metabolic symptoms.  If the improvement remains minimal however we we will consider discontinuing the medication.  Past Psychiatric History: One  prior psychiatric hospitalization to [facility] in February of 2024 due to increased depression/anxiety. No prior suicide attempts.  Patient did endorse passive SI in November of 2023 without any intent or plan.  She denies being connected with a psychiatrist in the past and had recently connected with a therapist at [facility] therapy.  Has taken Effexor  (possible restlessness at  225), Cymbalta , Remeron , Ativan , Xanax , Klonopin , gabapentin , BuSpar , Zyprexa  (TD), Latuda  (akathisia), Risperdal , Doxepin ,   Denies substance use other than 1 beer a day. Can increase to 2 when the weather is nice.  Past Medical History:  Past Medical History:  Diagnosis Date   Allergic bronchopulmonary aspergillosis (HCC) 2008   sees Dr Katheren Finn pulmonology   Anemia    iron  deficiency, resolved   Anxiety    Asthma    Breast cancer (HCC)    CAD (coronary artery disease)    a. LHC 6/16:  oOM1 60, pRCA 25 >> med Rx b. cath 3/19 2nd OM with 95% stenosis s/p synergy DES & anomalous RCA   CAP (community acquired pneumonia) 2016; 06/07/2016   Chronic bronchitis (HCC)    Chronic lower back pain    Complication of anesthesia    think I have a  hard time waking up from it   Depression    mild   Diverticulitis    Diverticulosis    GERD (gastroesophageal reflux disease)    H/O hiatal hernia    Headache    weekly (08/23/2017)   History of echocardiogram    Echo 6/16:  Mod LVH, EF 60-65%, no RWMA, Gr 1 DD, trivial MR, normal LA size.   History of radiation therapy    Left breast- 12/26/22-01/23/23-Dr. Lynwood Nasuti   Hyperglycemia 11/20/2015   Hyperlipidemia, mixed 09/11/2007   Qualifier: Diagnosis of  By: Antonio ROSALEA Rockers   Did not tolerate Lipitor, zocor, Lovastatin, Pravastatin, Livalo , Crestor  even low dose    IBS (irritable bowel syndrome)    Maxillary sinusitis    Normal cardiac stress test 11/2011   No evidence of ischemia or infarct.   Calculated ejection fraction 72%.   Obesity    OSA (obstructive sleep apnea) 02/2012   has stopped using  cpap   Osteoarthritis    Osteoporosis    Personal history of radiation therapy    Pneumonia 11/2011   before 2013 I hadn't had pneumonia since I was a child (04/13/2012)   Pulmonary nodules    S/P angioplasty with stent 08/23/17 ostial 2nd OM with DES synnergy 08/24/2017   Schatzki's ring     Past Surgical History:   Procedure Laterality Date   ANTERIOR AND POSTERIOR REPAIR N/A 10/18/2022   Procedure: ANTERIOR (CYSTOCELE);  Surgeon: Mat Browning, MD;  Location[facility] OR;  Service: Gynecology;  Laterality: N/A;   APPENDECTOMY  1989   BREAST BIOPSY  11/03/2022   MM LT RADIOACTIVE SEED LOC MAMMO GUIDE 11/03/2022 [facility] MAMMOGRAPHY   BREAST LUMPECTOMY WITH RADIOACTIVE SEED LOCALIZATION Left 11/04/2022   Procedure: LEFT BREAST LUMPECTOMY WITH RADIOACTIVE SEED LOCALIZATION;  Surgeon: Curvin Deward MOULD, MD;  Location: [facility];  Service: General;  Laterality: Left;   CARDIAC CATHETERIZATION N/A 11/25/2014   Procedure: Right/Left Heart Cath and Coronary Angiography;  Surgeon: Victory LELON Sharps, MD;  Location: [facility] INVASIVE CV LAB;  Service: Cardiovascular;  Laterality: N/A;   CESAREAN SECTION  1985   COLONOSCOPY  03/2022   CORONARY ANGIOPLASTY WITH STENT PLACEMENT  08/23/2017   CORONARY STENT INTERVENTION N/A 08/23/2017   Procedure: CORONARY STENT INTERVENTION;  Surgeon: Verlin Lonni BIRCH, MD;  Location: [facility] INVASIVE CV LAB;  Service: Cardiovascular;  Laterality: N/A;   HERNIA REPAIR  04/13/2012   VHR laparoscopic   LAPAROSCOPIC BILATERAL SALPINGO OOPHERECTOMY Bilateral 10/18/2022   Procedure: LAPAROTOMY WITH BILATERAL SALPINGO OOPHORECTOMY;  Surgeon: Mat Browning, MD;  Location: [facility] OR;  Service: Gynecology;  Laterality: Bilateral;   LEFT HEART CATH AND CORONARY ANGIOGRAPHY N/A 08/23/2017   Procedure: LEFT HEART CATH AND CORONARY ANGIOGRAPHY;  Surgeon: Verlin Lonni BIRCH, MD;  Location: [facility] INVASIVE CV LAB;  Service: Cardiovascular;  Laterality: N/A;   VAGINAL HYSTERECTOMY N/A 10/18/2022   Procedure: HYSTERECTOMY VAGINAL;  Surgeon: Mat Browning, MD;  Location: [facility] OR;  Service: Gynecology;  Laterality: N/A;   VENTRAL HERNIA REPAIR  04/13/2012   Procedure: LAPAROSCOPIC VENTRAL HERNIA;  Surgeon: Elon CHRISTELLA Pacini, MD;  Location: [facility] OR;  Service: General;  Laterality: N/A;  laparoscopic repair of  incarcerated hernia   Family History:  Family History  Problem Relation Age of Onset   Breast cancer Mother 55       bilateral breast cancer dx. 108, met to liver   Hypertension Mother    Diabetes Mother    Diverticulosis Father    Prostate cancer  Father    Breast cancer Sister 81       DCIS at 55, IDC at 12, PALB2+   Cancer Sister        breast cancer, invasive ductal carcinoma in 2022,DCIS at 27 with 4 weeks of radiation, 5 years of Tamoxifen     Pulmonary embolism Brother        recurrent   Heart attack Maternal Grandfather    Ovarian cancer Paternal Grandmother    Cerebral palsy Son    Prostate cancer Paternal Uncle    Breast cancer Niece 60       PALB2+   Osteoporosis Niece    Stroke Neg Hx    Colon cancer Neg Hx    Esophageal cancer Neg Hx    Stomach cancer Neg Hx    Rectal cancer Neg Hx     Social History:  Social History   Socioeconomic History   Marital status: Married    Spouse name: Not on file   Number of children: 1   Years of education: 14   Highest education level: Associate degree: academic program  Occupational History   Occupation: Disabled   Tobacco Use   Smoking status: Never    Passive exposure: Never   Smokeless tobacco: Never  Vaping Use   Vaping status: Never Used  Substance and Sexual Activity   Alcohol use: Yes    Alcohol/week: 4.0 - 7.0 standard drinks of alcohol    Types: 4 - 7 Standard drinks or equivalent per week   Drug use: No   Sexual activity: Yes    Birth control/protection: Surgical    Comment: gluten free, lives with husband and son with CP quadriplegia  Other Topics Concern   Not on file  Social History Narrative   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    Lives at home with her husband and son.   Right-handed.   2 cups caffeine per day.   One story home   Social Drivers of Health   Financial Resource Strain: Low Risk  (01/18/2023)   Overall Financial Resource Strain (CARDIA)    Difficulty of Paying Living Expenses:  Not hard at all  Food Insecurity: No Food Insecurity (01/18/2023)   Hunger Vital Sign    Worried About Running Out of Food in the Last Year: Never true    Ran Out of Food in the Last Year: Never true  Transportation Needs: No Transportation Needs (10/24/2022)   PRAPARE - Administrator, Civil Service (Medical): No    Lack of Transportation (Non-Medical): No  Physical Activity: Inactive (02/10/2023)   Exercise Vital Sign    Days of Exercise per Week: 0 days    Minutes of Exercise per Session: 0 min  Stress: Stress Concern Present (01/18/2023)   [employer] of Occupational Health - Occupational Stress Questionnaire    Feeling of Stress : To some extent  Social Connections: Unknown (01/18/2023)   Social Connection and Isolation Panel [NHANES]    Frequency of Communication with Friends and Family: Three times a week    Frequency of Social Gatherings with Friends and Family: Once a week    Attends Religious Services: More than 4 times per year    Active Member of [employer] or Organizations: Not on file    Attends Banker Meetings: Not on file    Marital Status: Married    Allergies:  Allergies  Allergen Reactions   Latuda  [Lurasidone  Hcl] Other (See Comments)    PER THE  PT CAUSED RESTLESSNESS   Beclomethasone Dipropionate Hives and Other (See Comments)     weight gain   Flexeril  [Cyclobenzaprine ] Anxiety   Mometasone  Furo-Formoterol Fum Hives and Other (See Comments)    weight gain   Sulfonamide Derivatives Hives and Rash   Statins Other (See Comments)    Myalgias, RLS    Current Medications: Current Outpatient Medications  Medication Sig Dispense Refill   acetaminophen  (TYLENOL ) 650 MG CR tablet Take 650 mg by mouth every 8 (eight) hours as needed for pain.     albuterol  (VENTOLIN  HFA) 108 (90 Base) MCG/ACT inhaler Inhale 1-2 puffs into the lungs every 6 (six) hours as needed. 8 g 2   ascorbic acid (VITAMIN C) 500 MG tablet Take 500 mg by mouth daily.      aspirin  EC 81 MG tablet Take 81 mg by mouth daily. Swallow whole.     Benralizumab  (FASENRA  PEN) 30 MG/ML SOAJ Inject 1 mL (30 mg total) into the skin every 8 (eight) weeks. 1 mL 2   budesonide  (ENTOCORT EC ) 3 MG 24 hr capsule TAKE 1 CAPSULE (3 MG TOTAL) BY MOUTH DAILY. 30 capsule 1   Calcium -Magnesium -Vitamin D  (CALCIUM  MAGNESIUM  PO) Take 1 tablet by mouth at bedtime.     Cholecalciferol  (VITAMIN D3 ULTRA STRENGTH) 125 MCG (5000 UT) capsule Take 5,000 Units by mouth daily.     [START ON 07/19/2023] clonazePAM  (KLONOPIN ) 0.5 MG tablet Take 1 tablet (0.5 mg total) by mouth daily. 30 tablet 1   denosumab  (PROLIA ) 60 MG/ML SOSY injection Inject 60 mg into the skin every 6 (six) months.     doxepin  (SINEQUAN ) 25 MG capsule Take 1 capsule (25 mg total) by mouth at bedtime. 30 capsule 3   Evolocumab  (REPATHA  SURECLICK) 140 MG/ML SOAJ Inject 140 mg into the skin every 14 (fourteen) days. 2 mL 11   famotidine  (PEPCID ) 20 MG tablet TAKE 1 TABLET BY MOUTH TWICE A DAY 60 tablet 11   ibuprofen  (ADVIL ) 200 MG tablet Take 400 mg by mouth every 6 (six) hours as needed for moderate pain.     ipratropium-albuterol  (DUONEB) 0.5-2.5 (3) MG/3ML SOLN Take 3 mLs by nebulization every 6 (six) hours as needed. 120 mL 5   metFORMIN  (GLUCOPHAGE ) 500 MG tablet Take 1 tablet (500 mg total) by mouth 2 (two) times daily with a meal. 60 tablet 2   nitroGLYCERIN  (NITROSTAT ) 0.4 MG SL tablet Place 1 tablet (0.4 mg total) under the tongue every 5 (five) minutes as needed for chest pain. 25 tablet 11   oxyCODONE  (ROXICODONE ) 5 MG immediate release tablet Take 1 tablet (5 mg total) by mouth every 6 (six) hours as needed for severe pain. (Patient not taking: Reported on 06/29/2023) 10 tablet 0   pindolol  (VISKEN ) 5 MG tablet Take 1 tablet (5 mg total) by mouth daily after breakfast. 30 tablet 3   risperiDONE  (RISPERDAL ) 0.5 MG tablet Take 1 tablet (0.5 mg total) by mouth daily. Take 1 mg Risperdal  tab at bedtime 60 tablet 2    risperiDONE  (RISPERDAL ) 1 MG tablet Take 1 tablet (1 mg total) by mouth at bedtime. 30 tablet 2   Semaglutide -Weight Management (WEGOVY ) 0.25 MG/0.5ML SOAJ Inject 0.25 mg into the skin once a week. (Patient not taking: Reported on 06/29/2023) 2 mL 1   tamoxifen  (NOLVADEX ) 20 MG tablet TAKE 1 TABLET (20 MG TOTAL) BY MOUTH DAILY. 30 tablet 3   venlafaxine  XR (EFFEXOR  XR) 75 MG 24 hr capsule Take 3 capsules (225 mg total) by  mouth daily with breakfast. 90 capsule 1   Current Facility-Administered Medications  Medication Dose Route Frequency Provider Last Rate Last Admin   [START ON 11/06/2023] denosumab  (PROLIA ) injection 60 mg  60 mg Subcutaneous Once Blyth, Stacey A, MD         Psychiatric Specialty Exam: Review of Systems  There were no vitals taken for this visit.There is no height or weight on file to calculate BMI.  General Appearance: Well Groomed  Eye Contact:  Good  Speech:  Clear and Coherent and Normal Rate  Volume:  Normal  Mood:  Euthymic  Affect:  Appropriate  Thought Process:  Coherent  Orientation:  Full (Time, Place, and Person)  Thought Content: Logical   Suicidal Thoughts:  No  Homicidal Thoughts:  No  Memory:  Immediate;   Good  Judgement:  Fair  Insight:  Fair  Psychomotor Activity:  Normal  Concentration:  Concentration: Good  Recall:  Good  Fund of Knowledge: Fair  Language: Good  Akathisia:  No    AIMS (if indicated): not done  Assets:  Communication Skills Desire for Improvement Financial Resources/Insurance Housing Transportation  ADL's:  Intact  Cognition: WNL  Sleep:  Good   Metabolic Disorder Labs: Lab Results  Component Value Date   HGBA1C 5.3 04/25/2023   MPG 117 10/22/2008   Lab Results  Component Value Date   PROLACTIN 15.4 08/12/2022   Lab Results  Component Value Date   CHOL 126 04/25/2023   TRIG 125.0 04/25/2023   HDL 63.20 04/25/2023   CHOLHDL 2 04/25/2023   VLDL 25.0 04/25/2023   LDLCALC 38 04/25/2023   LDLCALC 111 (H)  01/19/2023   Lab Results  Component Value Date   TSH 1.79 04/25/2023   TSH 1.76 01/19/2023    Therapeutic Level Labs: No results found for: LITHIUM No results found for: VALPROATE No results found for: CBMZ   Screenings: AIMS    Flowsheet Row Admission (Discharged) from 08/12/2022 in [facility] BE[facility]AL MEDICINE  AIMS Total Score 2      AUDIT    Flowsheet Row Admission (Discharged) from 08/12/2022 in [facility] BE[facility]AVIORAL MEDICINE  Alcohol Use Disorder Identification Test Final Score (AUDIT) 0      GAD-7    Flowsheet Row Video Visit from 10/13/2022 in [facility] at [facility] Office Visit from 06[facility]30/2022 in [facility] Office Visit from 05/26/2022 in [facility] Office Visit from 01/16/2017 in [facility]  Total GAD-7 Score 0 11 11 3       PHQ2-9    Flowsheet Row Clinical Support from 02/10/2023 in [facility] Primary Care at [facility] Office Visit from 01/19/2023 in [facility] Primary Care at [facility] Video Visit from 10/13/2022 in [facility] Care at [facility] Telephone from 08/19/2022 in [facility] Office Visit from 06/30/2022 in [facility]  PHQ-2 Total Score 0 0 0 2 3  PHQ-9 Total Score -- -- 0 -- 4      Flowsheet Row Admission (Discharged) from 11/04/2022 in [facility] Admission (Discharged) from 10/18/2022 in [facility] Pre-Admission Testing 60 from 10/13/2022 in [facility] PREADMISS[facility] TEST[facility]  C-SSRS RISK CATEGORY No Risk No Risk No Risk       Collaboration of Care: Collaboration of Care: Medication Management AEB medication prescription and Primary  Care Provider AEB chart review  Patient/Guardian was advised Release of Information must  be obtained prior to any record release in order to collaborate their care with an outside provider. Patient/Guardian was advised if they have not already done so to contact the registration department to sign all necessary forms in order for us  to release information regarding their care.   Consent: Patient/Guardian gives verbal consent for treatment and assignment of benefits for services provided during this visit. Patient/Guardian expressed understanding and agreed to proceed.    Victoria CHRISTELLA Finder, MD 06/30/2023, 11:02 AM   Virtual Visit via Video Note  I connected with Victoria [name] on 06/30/23 at 10:30 AM EST by a video enabled telemedicine application and verified that I am speaking with the correct person using two identifiers.  Location: Patient: Home Provider: Home Office   I discussed the limitations of evaluation and management by telemedicine and the availability of in person appointments. The patient expressed understanding and agreed to proceed.   I discussed the assessment and treatment plan with the patient. The patient was provided an opportunity to ask questions and all were answered. The patient agreed with the plan and demonstrated an understanding of the instructions.   The patient was advised to call back or seek an in-person evaluation if the symptoms worsen or if the condition fails to improve as anticipated.  I provided 20 minutes of non-face-to-face time during this encounter.   Victoria CHRISTELLA Finder, MD

## 2023-06-29 ENCOUNTER — Ambulatory Visit: Payer: PPO | Admitting: Gastroenterology

## 2023-06-29 ENCOUNTER — Encounter: Payer: Self-pay | Admitting: Gastroenterology

## 2023-06-29 ENCOUNTER — Other Ambulatory Visit (HOSPITAL_COMMUNITY): Payer: Self-pay | Admitting: Psychiatry

## 2023-06-29 VITALS — BP 112/68 | HR 76 | Ht 61.0 in | Wt 169.0 lb

## 2023-06-29 DIAGNOSIS — K52832 Lymphocytic colitis: Secondary | ICD-10-CM | POA: Insufficient documentation

## 2023-06-29 DIAGNOSIS — R159 Full incontinence of feces: Secondary | ICD-10-CM | POA: Diagnosis not present

## 2023-06-29 DIAGNOSIS — F419 Anxiety disorder, unspecified: Secondary | ICD-10-CM

## 2023-06-29 DIAGNOSIS — M545 Low back pain, unspecified: Secondary | ICD-10-CM | POA: Diagnosis not present

## 2023-06-29 DIAGNOSIS — M4120 Other idiopathic scoliosis, site unspecified: Secondary | ICD-10-CM | POA: Diagnosis not present

## 2023-06-29 DIAGNOSIS — M542 Cervicalgia: Secondary | ICD-10-CM | POA: Diagnosis not present

## 2023-06-29 NOTE — Progress Notes (Signed)
 06/29/2023 KONI KANNAN [id number] 09/02/1955   HISTORY OF PRESENT ILLNESS: This is a 68 year old female who is a patient Dr. Pamula.  She has a history of lymphocytic colitis that is responsive to budesonide , GERD with hiatal hernia, Schatzki's ring with dilation in 2016, colonic diverticulosis with history of diverticulitis, history of pulmonary aspergillosis with eosinophilic asthma and bronchiectasis, anxiety and depression.  She says that this visit was initially made because she called for refills on her budesonide  and she was told that she needed a visit.  They did go ahead and send a prescription for her.  She picked it up, but she had run out of the medication and felt fine so she never restarted it.  She has been off of it for about 3 months now and is feeling well.  Has occasional days where she will have some loose stools, but it is not a consistent problem.  She says that some of her psychiatric medications have made her gain 19 pounds.  Insurance would not approve Wegovy  so now she is on metformin  to see if it helps to prevent further weight gain.  She does report some intermittent/occasional fecal leakage.  She says that sometimes some soft stool will come out without her realizing.  She says that sometimes it goes a couple of weeks and does not occur.  Says that this been going on for a while, probably close to a year.  Last colonoscopy is up-to-date, October 2023.   Past Medical History:  Diagnosis Date   Allergic bronchopulmonary aspergillosis (HCC) 2008   sees Dr Katheren Finn pulmonology   Anemia    iron  deficiency, resolved   Anxiety    Asthma    Breast cancer (HCC)    CAD (coronary artery disease)    a. LHC 6/16:  oOM1 60, pRCA 25 >> med Rx b. cath 3/19 2nd OM with 95% stenosis s/p synergy DES & anomalous RCA   CAP (community acquired pneumonia) 2016; 06/07/2016   Chronic bronchitis (HCC)    Chronic lower back pain    Complication of anesthesia    think  I have a hard time waking up from it   Depression    mild   Diverticulitis    Diverticulosis    GERD (gastroesophageal reflux disease)    H/O hiatal hernia    Headache    weekly (08/23/2017)   History of echocardiogram    Echo 6/16:  Mod LVH, EF 60-65%, no RWMA, Gr 1 DD, trivial MR, normal LA size.   History of radiation therapy    Left breast- 12/26/22-01/23/23-Dr. Lynwood Nasuti   Hyperglycemia 11/20/2015   Hyperlipidemia, mixed 09/11/2007   Qualifier: Diagnosis of  By: Antonio ROSALEA Rockers   Did not tolerate Lipitor, zocor, Lovastatin, Pravastatin, Livalo , Crestor  even low dose    IBS (irritable bowel syndrome)    Maxillary sinusitis    Normal cardiac stress test 11/2011   No evidence of ischemia or infarct.   Calculated ejection fraction 72%.   Obesity    OSA (obstructive sleep apnea) 02/2012   has stopped using  cpap   Osteoarthritis    Osteoporosis    Personal history of radiation therapy    Pneumonia 11/2011   before 2013 I hadn't had pneumonia since I was a child (04/13/2012)   Pulmonary nodules    S/P angioplasty with stent 08/23/17 ostial 2nd OM with DES synnergy 08/24/2017   Schatzki's ring    Past Surgical History:  Procedure Laterality  Date   ANTERIOR AND POSTERIOR REPAIR N/A 10/18/2022   Procedure: ANTERIOR (CYSTOCELE);  Surgeon: Mat Browning, MD;  Location: [facility] OR;  Service: Gynecology;  Laterality: N/A;   APPENDECTOMY  1989   BREAST BIOPSY  11/03/2022   MM LT RADIOACTIVE SEED LOC MAMMO GUIDE 11/03/2022 [facility] MAMMOGRAPHY   BREAST LUMPECTOMY WITH RADIOACTIVE SEED LOCALIZATION Left 11/04/2022   Procedure: LEFT BREAST LUMPECTOMY WITH RADIOACTIVE SEED LOCALIZATION;  Surgeon: Curvin Deward MOULD, MD;  Location: [facility];  Service: General;  Laterality: Left;   CARDIAC CATHETERIZATION N/A 11/25/2014   Procedure: Right/Left Heart Cath and Coronary Angiography;  Surgeon: Victory LELON Sharps, MD;  Location: [facility] INVASIVE CV LAB;  Service: Cardiovascular;  Laterality:  N/A;   CESAREAN SECTION  1985   COLONOSCOPY  03/2022   CORONARY ANGIOPLASTY WITH STENT PLACEMENT  08/23/2017   CORONARY STENT INTERVENTION N/A 08/23/2017   Procedure: CORONARY STENT INTERVENTION;  Surgeon: Verlin Lonni BIRCH, MD;  Location: [facility] INVASIVE CV LAB;  Service: Cardiovascular;  Laterality: N/A;   HERNIA REPAIR  04/13/2012   VHR laparoscopic   LAPAROSCOPIC BILATERAL SALPINGO OOPHERECTOMY Bilateral 10/18/2022   Procedure: LAPAROTOMY WITH BILATERAL SALPINGO OOPHORECTOMY;  Surgeon: Mat Browning, MD;  Location: [facility] OR;  Service: Gynecology;  Laterality: Bilateral;   LEFT HEART CATH AND CORONARY ANGIOGRAPHY N/A 08/23/2017   Procedure: LEFT HEART CATH AND CORONARY ANGIOGRAPHY;  Surgeon: Verlin Lonni BIRCH, MD;  Location: [facility] INVASIVE CV LAB;  Service: Cardiovascular;  Laterality: N/A;   VAGINAL HYSTERECTOMY N/A 10/18/2022   Procedure: HYSTERECTOMY VAGINAL;  Surgeon: Mat Browning, MD;  Location: [facility] OR;  Service: Gynecology;  Laterality: N/A;   VENTRAL HERNIA REPAIR  04/13/2012   Procedure: LAPAROSCOPIC VENTRAL HERNIA;  Surgeon: Elon CHRISTELLA Pacini, MD;  Location: [facility] OR;  Service: General;  Laterality: N/A;  laparoscopic repair of incarcerated hernia    reports that she has never smoked. She has never been exposed to tobacco smoke. She has never used smokeless tobacco. She reports current alcohol use of about 4.0 - 7.0 standard drinks of alcohol per week. She reports that she does not use drugs. family history includes Breast cancer (age of onset: 58) in her niece; Breast cancer (age of onset: 51) in her sister; Breast cancer (age of onset: 16) in her mother; Cancer in her sister; Cerebral palsy in her son; Diabetes in her mother; Diverticulosis in her father; Heart attack in her maternal grandfather; Hypertension in her mother; Osteoporosis in her niece; Ovarian cancer in her paternal grandmother; Prostate cancer in her father and paternal uncle; Pulmonary embolism in her  brother. Allergies  Allergen Reactions   Latuda  [Lurasidone  Hcl] Other (See Comments)    PER THE PT CAUSED RESTLESSNESS   Beclomethasone Dipropionate Hives and Other (See Comments)     weight gain   Flexeril  [Cyclobenzaprine ] Anxiety   Mometasone  Furo-Formoterol Fum Hives and Other (See Comments)    weight gain   Sulfonamide Derivatives Hives and Rash   Statins Other (See Comments)    Myalgias, RLS      Outpatient Encounter Medications as of 06/29/2023  Medication Sig   acetaminophen  (TYLENOL ) 650 MG CR tablet Take 650 mg by mouth every 8 (eight) hours as needed for pain.   albuterol  (VENTOLIN  HFA) 108 (90 Base) MCG/ACT inhaler Inhale 1-2 puffs into the lungs every 6 (six) hours as needed.   ascorbic acid (VITAMIN C) 500 MG tablet Take 500 mg by mouth daily.   aspirin  EC 81 MG tablet Take 81 mg by mouth daily.  Swallow whole.   Benralizumab  (FASENRA  PEN) 30 MG/ML SOAJ Inject 1 mL (30 mg total) into the skin every 8 (eight) weeks.   budesonide  (ENTOCORT EC ) 3 MG 24 hr capsule TAKE 1 CAPSULE (3 MG TOTAL) BY MOUTH DAILY.   Calcium -Magnesium -Vitamin D  (CALCIUM  MAGNESIUM  PO) Take 1 tablet by mouth at bedtime.   Cholecalciferol  (VITAMIN D3 ULTRA STRENGTH) 125 MCG (5000 UT) capsule Take 5,000 Units by mouth daily.   clonazePAM  (KLONOPIN ) 0.5 MG tablet Take 1 tablet (0.5 mg total) by mouth daily. Take 1 mg tab each morning for 3 weeks until next appointment   denosumab  (PROLIA ) 60 MG/ML SOSY injection Inject 60 mg into the skin every 6 (six) months.   doxepin  (SINEQUAN ) 25 MG capsule Take 1 capsule (25 mg total) by mouth at bedtime.   Evolocumab  (REPATHA  SURECLICK) 140 MG/ML SOAJ Inject 140 mg into the skin every 14 (fourteen) days.   famotidine  (PEPCID ) 20 MG tablet TAKE 1 TABLET BY MOUTH TWICE A DAY   ibuprofen  (ADVIL ) 200 MG tablet Take 400 mg by mouth every 6 (six) hours as needed for moderate pain.   ipratropium-albuterol  (DUONEB) 0.5-2.5 (3) MG/3ML SOLN Take 3 mLs by nebulization every 6  (six) hours as needed.   metFORMIN  (GLUCOPHAGE ) 500 MG tablet Take 1 tablet (500 mg total) by mouth 2 (two) times daily with a meal.   nitroGLYCERIN  (NITROSTAT ) 0.4 MG SL tablet Place 1 tablet (0.4 mg total) under the tongue every 5 (five) minutes as needed for chest pain.   pindolol  (VISKEN ) 5 MG tablet Take 1 tablet (5 mg total) by mouth daily after breakfast.   risperiDONE  (RISPERDAL ) 0.5 MG tablet Take 1 tablet (0.5 mg total) by mouth daily. Take 1 mg Risperdal  tab at bedtime   risperiDONE  (RISPERDAL ) 1 MG tablet TAKE 1 TABLET BY MOUTH AT BEDTIME. TAKE 0.5 MG TABLET IN THE MORNING.   tamoxifen  (NOLVADEX ) 20 MG tablet TAKE 1 TABLET (20 MG TOTAL) BY MOUTH DAILY.   venlafaxine  XR (EFFEXOR  XR) 75 MG 24 hr capsule Take 3 capsules (225 mg total) by mouth daily with breakfast.   oxyCODONE  (ROXICODONE ) 5 MG immediate release tablet Take 1 tablet (5 mg total) by mouth every 6 (six) hours as needed for severe pain. (Patient not taking: Reported on 06/29/2023)   Semaglutide -Weight Management (WEGOVY ) 0.25 MG/0.5ML SOAJ Inject 0.25 mg into the skin once a week. (Patient not taking: Reported on 06/29/2023)   Facility-Administered Encounter Medications as of 06/29/2023  Medication   [START ON 11/06/2023] denosumab  (PROLIA ) injection 60 mg    REVIEW OF SYSTEMS  : All other systems reviewed and negative except where noted in the History of Present Illness.   PHYSICAL EXAM: BP 112/68 (BP Location: Left Arm, Patient Position: Sitting)   Pulse 76   Ht 5' 1 (1.549 m)   Wt 169 lb (76.7 kg)   BMI 31.93 kg/m  General: Well developed white female in no acute distress Head: Normocephalic and atraumatic Eyes:  Sclerae anicteric, conjunctiva pink. Ears: Normal auditory acuity Lungs: Clear throughout to auscultation; no W/R/R. Heart: Regular rate and rhythm; no M/R/G. Abdomen: Soft, non-distended.  BS present.  Non-tender. Musculoskeletal: Symmetrical with no gross deformities  Skin: No lesions on visible  extremities Extremities: No edema  Neurological: Alert oriented x 4, grossly non-focal Psychological:  Alert and cooperative. Normal mood and affect  ASSESSMENT AND PLAN: *Lymphocytic colitis:  Has been off of budesonide  now for about 3 months and is doing well.  She did get it refilled  though so has it at home.  I have asked her to be sure that she contacts us  to let us  know if she is having any recurrent issues.  She is also now on metformin  so she was advised that can cause loose stools as well. *Fecal leakage: Reports intermittent occasional leakage of stool.  Advises likely due to weakened pelvic floor musculature and sphincter tone.  Can try Benefiber starting with 2 teaspoons mixed in 8 ounces of liquid daily increase to twice daily if needed to help with bulk.  We also discussed possibility of pelvic floor physical therapy if she decides that she is interested in that.  CC:  Domenica Harlene LABOR, MD

## 2023-06-29 NOTE — Patient Instructions (Signed)
 Start Benefiber 2 teaspoons in 8 ounces of liquid daily and may increase to twice daily if needed.   Call back with any recurrent diarrhea.   _______________________________________________________  If your blood pressure at your visit was 140/90 or greater, please contact your primary care physician to follow up on this.  _______________________________________________________  If you are age 67 or older, your body mass index should be between 23-30. Your Body mass index is 31.93 kg/m. If this is out of the aforementioned range listed, please consider follow up with your Primary Care Provider.  If you are age 87 or younger, your body mass index should be between 19-25. Your Body mass index is 31.93 kg/m. If this is out of the aformentioned range listed, please consider follow up with your Primary Care Provider.   ________________________________________________________  The [facility] providers would like to encourage you to use MYCHART to communicate with providers for non-urgent requests or questions.  Due to long hold times on the telephone, sending your provider a message by [facility] may be a faster and more efficient way to get a response.  Please allow 48 business hours for a response.  Please remember that this is for non-urgent requests.  _______________________________________________________

## 2023-06-30 ENCOUNTER — Telehealth (HOSPITAL_COMMUNITY): Payer: PPO | Admitting: Psychiatry

## 2023-06-30 ENCOUNTER — Encounter (HOSPITAL_COMMUNITY): Payer: Self-pay | Admitting: Psychiatry

## 2023-06-30 DIAGNOSIS — F419 Anxiety disorder, unspecified: Secondary | ICD-10-CM | POA: Diagnosis not present

## 2023-06-30 DIAGNOSIS — F32A Depression, unspecified: Secondary | ICD-10-CM

## 2023-06-30 MED ORDER — CLONAZEPAM 0.5 MG PO TABS: 0.5000 mg | ORAL_TABLET | Freq: Every day | ORAL | 1 refills | Status: DC

## 2023-06-30 MED ORDER — PINDOLOL 5 MG PO TABS: 5.0000 mg | ORAL_TABLET | Freq: Every day | ORAL | 3 refills | Status: DC

## 2023-06-30 MED ORDER — RISPERIDONE 1 MG PO TABS: 1.0000 mg | ORAL_TABLET | Freq: Every day | ORAL | 2 refills | Status: DC

## 2023-06-30 MED ORDER — DOXEPIN HCL 25 MG PO CAPS: 25.0000 mg | ORAL_CAPSULE | Freq: Every day | ORAL | 3 refills | Status: DC

## 2023-06-30 NOTE — Progress Notes (Signed)
 Addendum: Reviewed and agree with assessment and management plan. Asha Grumbine, Carie Caddy, MD

## 2023-07-02 NOTE — Assessment & Plan Note (Signed)
 invasive ductal carcinoma, stage Ia, pT1aN0M0, ER+/PR+/HER2-, G1 --We discussed her imaging findings and the biopsy results in great details. -Giving the very early disease, she likely a candidate for lumpectomy. She is agreeable with that. She was seen by Dr. Carolynne Edouard today and likely will proceed with surgery soon.  Given her age and small primary tumor, it is reasonable to forego sentinel lymph node biopsy. -Given the small tumor, no Oncotype or adjuvant chemotherapy needed.  Given the favorable prognostic factors, this is likely low risk disease. -she underwent left breast lumpectomy on Nov 04, 2022, I reviewed her surgical path. -She completed adjuvant radiation on January 23, 2023. -she started adjuvant tamoxifen in 01/2023

## 2023-07-03 ENCOUNTER — Inpatient Hospital Stay: Payer: PPO | Attending: Hematology

## 2023-07-03 ENCOUNTER — Encounter: Payer: Self-pay | Admitting: Hematology

## 2023-07-03 ENCOUNTER — Inpatient Hospital Stay (HOSPITAL_BASED_OUTPATIENT_CLINIC_OR_DEPARTMENT_OTHER): Payer: PPO | Admitting: Hematology

## 2023-07-03 ENCOUNTER — Inpatient Hospital Stay: Payer: PPO

## 2023-07-03 VITALS — BP 106/64 | HR 72 | Temp 98.1°F | Resp 15 | Wt 168.4 lb

## 2023-07-03 DIAGNOSIS — Z1589 Genetic susceptibility to other disease: Secondary | ICD-10-CM

## 2023-07-03 DIAGNOSIS — Z923 Personal history of irradiation: Secondary | ICD-10-CM | POA: Insufficient documentation

## 2023-07-03 DIAGNOSIS — D649 Anemia, unspecified: Secondary | ICD-10-CM | POA: Diagnosis not present

## 2023-07-03 DIAGNOSIS — Z170 Estrogen receptor positive status [ER+]: Secondary | ICD-10-CM | POA: Insufficient documentation

## 2023-07-03 DIAGNOSIS — C50512 Malignant neoplasm of lower-outer quadrant of left female breast: Secondary | ICD-10-CM | POA: Insufficient documentation

## 2023-07-03 DIAGNOSIS — Z79810 Long term (current) use of selective estrogen receptor modulators (SERMs): Secondary | ICD-10-CM | POA: Diagnosis not present

## 2023-07-03 LAB — CMP (CANCER CENTER ONLY)
ALT: 13 U/L (ref 0–44)
AST: 17 U/L (ref 15–41)
Albumin: 3.7 g/dL (ref 3.5–5.0)
Alkaline Phosphatase: 55 U/L (ref 38–126)
Anion gap: 6 (ref 5–15)
BUN: 13 mg/dL (ref 8–23)
CO2: 27 mmol/L (ref 22–32)
Calcium: 8.9 mg/dL (ref 8.9–10.3)
Chloride: 100 mmol/L (ref 98–111)
Creatinine: 0.67 mg/dL (ref 0.44–1.00)
GFR, Estimated: >60 mL/min (ref >60)
Glucose, Bld: 96 mg/dL (ref 70–99)
Potassium: 4.6 mmol/L (ref 3.5–5.1)
Sodium: 133 mmol/L — ABNORMAL LOW (ref 135–145)
Total Bilirubin: 0.2 mg/dL (ref 0.0–1.2)
Total Protein: 6 g/dL — ABNORMAL LOW (ref 6.5–8.1)

## 2023-07-03 LAB — RETIC PANEL
Immature Retic Fract: 23.9 % — ABNORMAL HIGH (ref 2.3–15.9)
RBC.: 3.16 MIL/uL — ABNORMAL LOW (ref 3.87–5.11)
Retic Count, Absolute: 78.1 10*3/uL (ref 19.0–186.0)
Retic Ct Pct: 2.5 % (ref 0.4–3.1)
Reticulocyte Hemoglobin: 22.2 pg — ABNORMAL LOW (ref >27.9)

## 2023-07-03 LAB — CBC WITH DIFFERENTIAL (CANCER CENTER ONLY)
Abs Immature Granulocytes: 0.03 10*3/uL (ref 0.00–0.07)
Basophils Absolute: 0 10*3/uL (ref 0.0–0.1)
Basophils Relative: 0 %
Eosinophils Absolute: 0 10*3/uL (ref 0.0–0.5)
Eosinophils Relative: 0 %
HCT: 26.3 % — ABNORMAL LOW (ref 36.0–46.0)
Hemoglobin: 8.4 g/dL — ABNORMAL LOW (ref 12.0–15.0)
Immature Granulocytes: 1 %
Lymphocytes Relative: 14 %
Lymphs Abs: 0.8 10*3/uL (ref 0.7–4.0)
MCH: 26.8 pg (ref 26.0–34.0)
MCHC: 31.9 g/dL (ref 30.0–36.0)
MCV: 83.8 fL (ref 80.0–100.0)
Monocytes Absolute: 0.6 10*3/uL (ref 0.1–1.0)
Monocytes Relative: 10 %
Neutro Abs: 4.2 10*3/uL (ref 1.7–7.7)
Neutrophils Relative %: 75 %
Platelet Count: 269 10*3/uL (ref 150–400)
RBC: 3.14 MIL/uL — ABNORMAL LOW (ref 3.87–5.11)
RDW: 13.2 % (ref 11.5–15.5)
WBC Count: 5.6 10*3/uL (ref 4.0–10.5)
nRBC: 0 % (ref 0.0–0.2)

## 2023-07-03 LAB — FERRITIN: Ferritin: 8 ng/mL — ABNORMAL LOW (ref 11–307)

## 2023-07-03 LAB — FOLATE: Folate: 11.2 ng/mL (ref >5.9)

## 2023-07-03 LAB — VITAMIN B12: Vitamin B-12: 559 pg/mL (ref 180–914)

## 2023-07-03 NOTE — Progress Notes (Signed)
 [facility]   [phone] Fax:([phone]   Clinic Follow up Note   Patient Care Team: Domenica Harlene LABOR, MD as PCP - General (Family Medicine) Lavona Agent, MD as PCP - Cardiology (Cardiology) Haverstock, Tawni CROME, MD as Referring Physician (Dermatology) Geronimo Amel, MD as Consulting Physician (Pulmonary Disease) Maranda Leim DEL, MD as Consulting Physician (Cardiology) Mat Browning, MD as Consulting Physician (Obstetrics and Gynecology) Roark Rush, MD as Consulting Physician (Otolaryngology) Pyrtle, Gordy HERO, MD as Consulting Physician (Gastroenterology) Tyree Nanetta SAILOR, RN as Oncology Nurse Navigator Glean Stephane BROCKS, RN as Oncology Nurse Navigator Curvin Deward MOULD, MD as Consulting Physician (General Surgery) Lanny Callander, MD as Consulting Physician (Hematology) Shannon Agent, MD as Consulting Physician (Radiation Oncology)  Date of Service:  07/03/2023  CHIEF COMPLAINT: f/u of breast cancer   CURRENT THERAPY:  Adjuvant tamoxifen , started in August 2024  Oncology History   Primary malignant neoplasm of lower-outer quadrant of breast, left (HCC) invasive ductal carcinoma, stage Ia, pT1aN0M0, ER+/PR+/HER2-, G1 --We discussed her imaging findings and the biopsy results in great details. -Giving the very early disease, she likely a candidate for lumpectomy. She is agreeable with that. She was seen by Dr. Curvin today and likely will proceed with surgery soon.  Given her age and small primary tumor, it is reasonable to forego sentinel lymph node biopsy. -Given the small tumor, no Oncotype or adjuvant chemotherapy needed.  Given the favorable prognostic factors, this is likely low risk disease. -she underwent left breast lumpectomy on Nov 04, 2022, I reviewed her surgical path. -She completed adjuvant radiation on January 23, 2023. -she started adjuvant tamoxifen  in 01/2023    Assessment and Plan    Breast Cancer, PALB2 mutation (+) Follow-up  for breast cancer. Left-sided surgery site healed with some scar tissue, no tenderness or color changes. On tamoxifen  for 5 months, tolerating well without hot flashes or joint pain. Discussed benefits of tamoxifen  in strengthening bones and risks of blood clots. Family history of breast cancer and carries Palpatine gene mutation. Discussed need for regular mammograms and breast MRIs due to genetic risk. - Order breast MRI for October 08, 2023 - Continue tamoxifen  - Schedule next visit in six months  Anemia Hemoglobin 8.4, indicating anemia. Iron  deficiency anemia and GI bleeding in 2022. Unable to tolerate oral iron . Recent colonoscopy and endoscopy normal. Differential includes iron  deficiency, B12, and folate deficiency. Discussed need for IV iron  if iron  deficiency confirmed. - Order iron  studies, B12, and folate levels -will schedule iv iron  if needed  - Repeat labs in one month and three months - Set up IV iron  if iron  deficiency confirmed  Osteoporosis Osteoporosis with T-score of -2.8 in the left forearm. Spine not assessable via DEXA. Benefits of tamoxifen  in strengthening bones discussed.  Lymphocytic Colitis Lymphocytic colitis with chronic diarrhea, currently under control.  General Health Maintenance Up-to-date with mammogram and bone density scan. Discussed importance of staying active to prevent blood clots. - Encourage physical activity  Plan -will send her back to lab for anemia work up -if iron  deficiency confirmed, will schedule IV iron  - Schedule next visit in six months - Repeat labs in one month and three months.         SUMMARY OF ONCOLOGIC HISTORY: Oncology History Overview Note   Cancer Staging  Primary malignant neoplasm of lower-outer quadrant of breast, left Staging form: Breast, AJCC 8th Edition - Clinical stage from 10/10/2022: cT1a, cN0, cM0, G1 - Unsigned Stage prefix: Initial diagnosis Method  of lymph node assessment: Clinical Histologic grading  system: 3 grade system     Primary malignant neoplasm of lower-outer quadrant of breast, left (HCC)  10/10/2022 Initial Diagnosis   Primary malignant neoplasm of lower-outer quadrant of breast, left   11/04/2022 Cancer Staging   Staging form: Breast, AJCC 8th Edition - Pathologic stage from 11/04/2022: Stage Unknown (pT1a, pNX, G1, ER+, PR+, HER2-) - Signed by Lanny Callander, MD on 01/12/2023 Histologic grading system: 3 grade system Residual tumor (R): R0 - None   11/04/2022 Surgery   LEFT BREAST LUMPECTOMY WITH RADIOACTIVE SEED LOCALIZATION   Surgeon:Toth, Deward MOULD, MD    11/04/2022 Pathology Results    FINAL MICROSCOPIC DIAGNOSIS:  A. BREAST, LEFT, LUMPECTOMY:      Invasive ductal carcinoma, 0.3 cm, grade 1 Ductal carcinoma in situ:  Not identified Margins, invasive:  Negative           Closest, invasive:  Posterior margin Margins, DCIS:  NA           Closest, DCIS:  NA Lymphovascular invasion:  Not identified Prognostic markers: ER: 100%, Positive, strong staining intensity PR: 100%, Positive, strong staining intensity Her2: Negative, 0 Ki-67: 5% Other:  Sclerosing adenosis, microcysts, dilated mammary ducts      Biopsy sites and clips identified See oncology table  B. BREAST, LEFT LATERAL MARGIN, EXCISION:      Benign breast tissue with sclerosing adenosis and microcysts.      Negative for malignancy.  C. BREAST, LEFT ANTERIOR MARGIN, EXCISION:      Benign breast tissue with sclerosing adenosis, microcysts, apocrine metaplasia, dilated mammary ducts.      Negative for malignancy.       Discussed the use of AI scribe software for clinical note transcription with the patient, who gave verbal consent to proceed.  History of Present Illness   The patient, a 68 year old individual with a history of breast cancer, presents for a routine follow-up. She reports no concerns about her breast and has been performing regular self-examinations.  The patient also reports being on  tamoxifen  for about four months, starting at the end of August, with no noted side effects such as hot flashes. She has been managing her medication regimen independently, which includes several medications for depression, anxiety, severe asthma, and heart disease.  The patient also mentions a history of lymphocytic colitis, which is currently under control. She has a history of anemia and is currently feeling tired. She has previously received IV iron  infusions due to intolerance to oral iron  supplements. She also reports a history of GI bleeding.  The patient has a history of a total hysterectomy and is currently managing weight issues. She has expressed interest in Wegovy  for weight management but is unsure about its availability through her insurance.         All other systems were reviewed with the patient and are negative.  MEDICAL HISTORY:  Past Medical History:  Diagnosis Date   Allergic bronchopulmonary aspergillosis (HCC) 2008   sees Dr Katheren Finn pulmonology   Anemia    iron  deficiency, resolved   Anxiety    Asthma    Breast cancer (HCC)    CAD (coronary artery disease)    a. LHC 6/16:  oOM1 60, pRCA 25 >> med Rx b. cath 3/19 2nd OM with 95% stenosis s/p synergy DES & anomalous RCA   CAP (community acquired pneumonia) 2016; 06/07/2016   Chronic bronchitis (HCC)    Chronic lower back pain    Complication  of anesthesia    think I have a hard time waking up from it   Depression    mild   Diverticulitis    Diverticulosis    GERD (gastroesophageal reflux disease)    H/O hiatal hernia    Headache    weekly (08/23/2017)   History of echocardiogram    Echo 6/16:  Mod LVH, EF 60-65%, no RWMA, Gr 1 DD, trivial MR, normal LA size.   History of radiation therapy    Left breast- 12/26/22-01/23/23-Dr. Lynwood Nasuti   Hyperglycemia 11/20/2015   Hyperlipidemia, mixed 09/11/2007   Qualifier: Diagnosis of  By: Antonio ROSALEA Rockers   Did not tolerate Lipitor, zocor,  Lovastatin, Pravastatin, Livalo , Crestor  even low dose    IBS (irritable bowel syndrome)    Maxillary sinusitis    Normal cardiac stress test 11/2011   No evidence of ischemia or infarct.   Calculated ejection fraction 72%.   Obesity    OSA (obstructive sleep apnea) 02/2012   has stopped using  cpap   Osteoarthritis    Osteoporosis    Personal history of radiation therapy    Pneumonia 11/2011   before 2013 I hadn't had pneumonia since I was a child (04/13/2012)   Pulmonary nodules    S/P angioplasty with stent 08/23/17 ostial 2nd OM with DES synnergy 08/24/2017   Schatzki's ring     SURGICAL HISTORY: Past Surgical History:  Procedure Laterality Date   ANTERIOR AND POSTERIOR REPAIR N/A 10/18/2022   Procedure: ANTERIOR (CYSTOCELE);  Surgeon: Mat Browning, MD;  Location: [facility] OR;  Service: Gynecology;  Laterality: N/A;   APPENDECTOMY  1989   BREAST BIOPSY  11/03/2022   MM LT RADIOACTIVE SEED LOC MAMMO GUIDE 11/03/2022 [facility] MAMMOGRAPHY   BREAST LUMPECTOMY WITH RADIOACTIVE SEED LOCALIZATION Left 11/04/2022   Procedure: LEFT BREAST LUMPECTOMY WITH RADIOACTIVE SEED LOCALIZATION;  Surgeon: Curvin Deward MOULD, MD;  Location: [facility];  Service: General;  Laterality: Left;   CARDIAC CATHETERIZATION N/A 11/25/2014   Procedure: Right/Left Heart Cath and Coronary Angiography;  Surgeon: Victory LELON Sharps, MD;  Location: [facility] INVASIVE CV LAB;  Service: Cardiovascular;  Laterality: N/A;   CESAREAN SECTION  1985   COLONOSCOPY  03/2022   CORONARY ANGIOPLASTY WITH STENT PLACEMENT  08/23/2017   CORONARY STENT INTERVENTION N/A 08/23/2017   Procedure: CORONARY STENT INTERVENTION;  Surgeon: Verlin Lonni BIRCH, MD;  Location: [facility] INVASIVE CV LAB;  Service: Cardiovascular;  Laterality: N/A;   HERNIA REPAIR  04/13/2012   VHR laparoscopic   LAPAROSCOPIC BILATERAL SALPINGO OOPHERECTOMY Bilateral 10/18/2022   Procedure: LAPAROTOMY WITH BILATERAL SALPINGO OOPHORECTOMY;  Surgeon: Mat Browning,  MD;  Location: [facility] OR;  Service: Gynecology;  Laterality: Bilateral;   LEFT HEART CATH AND CORONARY ANGIOGRAPHY N/A 08/23/2017   Procedure: LEFT HEART CATH AND CORONARY ANGIOGRAPHY;  Surgeon: Verlin Lonni BIRCH, MD;  Location: [facility] INVASIVE CV LAB;  Service: Cardiovascular;  Laterality: N/A;   VAGINAL HYSTERECTOMY N/A 10/18/2022   Procedure: HYSTERECTOMY VAGINAL;  Surgeon: Mat Browning, MD;  Location: [facility] OR;  Service: Gynecology;  Laterality: N/A;   VENTRAL HERNIA REPAIR  04/13/2012   Procedure: LAPAROSCOPIC VENTRAL HERNIA;  Surgeon: Elon CHRISTELLA Pacini, MD;  Location: [facility] OR;  Service: General;  Laterality: N/A;  laparoscopic repair of incarcerated hernia    I have reviewed the social history and family history with the patient and they are unchanged from previous note.  ALLERGIES:  is allergic to latuda  [lurasidone  hcl], beclomethasone dipropionate, flexeril  [cyclobenzaprine ], mometasone  furo-formoterol fum, sulfonamide derivatives, and  statins.  MEDICATIONS:  Current Outpatient Medications  Medication Sig Dispense Refill   acetaminophen  (TYLENOL ) 650 MG CR tablet Take 650 mg by mouth every 8 (eight) hours as needed for pain.     albuterol  (VENTOLIN  HFA) 108 (90 Base) MCG/ACT inhaler Inhale 1-2 puffs into the lungs every 6 (six) hours as needed. 8 g 2   ascorbic acid (VITAMIN C) 500 MG tablet Take 500 mg by mouth daily.     aspirin  EC 81 MG tablet Take 81 mg by mouth daily. Swallow whole.     Benralizumab  (FASENRA  PEN) 30 MG/ML SOAJ Inject 1 mL (30 mg total) into the skin every 8 (eight) weeks. 1 mL 2   budesonide  (ENTOCORT EC ) 3 MG 24 hr capsule TAKE 1 CAPSULE (3 MG TOTAL) BY MOUTH DAILY. 30 capsule 1   Calcium -Magnesium -Vitamin D  (CALCIUM  MAGNESIUM  PO) Take 1 tablet by mouth at bedtime.     Cholecalciferol  (VITAMIN D3 ULTRA STRENGTH) 125 MCG (5000 UT) capsule Take 5,000 Units by mouth daily.     [START ON 07/19/2023] clonazePAM  (KLONOPIN ) 0.5 MG tablet Take 1 tablet (0.5 mg total) by  mouth daily. 30 tablet 1   denosumab  (PROLIA ) 60 MG/ML SOSY injection Inject 60 mg into the skin every 6 (six) months.     doxepin  (SINEQUAN ) 25 MG capsule Take 1 capsule (25 mg total) by mouth at bedtime. 30 capsule 3   Evolocumab  (REPATHA  SURECLICK) 140 MG/ML SOAJ Inject 140 mg into the skin every 14 (fourteen) days. 2 mL 11   famotidine  (PEPCID ) 20 MG tablet TAKE 1 TABLET BY MOUTH TWICE A DAY 60 tablet 11   ibuprofen  (ADVIL ) 200 MG tablet Take 400 mg by mouth every 6 (six) hours as needed for moderate pain.     ipratropium-albuterol  (DUONEB) 0.5-2.5 (3) MG/3ML SOLN Take 3 mLs by nebulization every 6 (six) hours as needed. 120 mL 5   metFORMIN  (GLUCOPHAGE ) 500 MG tablet Take 1 tablet (500 mg total) by mouth 2 (two) times daily with a meal. 60 tablet 2   nitroGLYCERIN  (NITROSTAT ) 0.4 MG SL tablet Place 1 tablet (0.4 mg total) under the tongue every 5 (five) minutes as needed for chest pain. 25 tablet 11   pindolol  (VISKEN ) 5 MG tablet Take 1 tablet (5 mg total) by mouth daily after breakfast. 30 tablet 3   risperiDONE  (RISPERDAL ) 0.5 MG tablet Take 1 tablet (0.5 mg total) by mouth daily. Take 1 mg Risperdal  tab at bedtime 60 tablet 2   risperiDONE  (RISPERDAL ) 1 MG tablet Take 1 tablet (1 mg total) by mouth at bedtime. 30 tablet 2   tamoxifen  (NOLVADEX ) 20 MG tablet TAKE 1 TABLET (20 MG TOTAL) BY MOUTH DAILY. 30 tablet 3   venlafaxine  XR (EFFEXOR  XR) 75 MG 24 hr capsule Take 3 capsules (225 mg total) by mouth daily with breakfast. 90 capsule 1   Current Facility-Administered Medications  Medication Dose Route Frequency Provider Last Rate Last Admin   [START ON 11/06/2023] denosumab  (PROLIA ) injection 60 mg  60 mg Subcutaneous Once Domenica Harlene LABOR, MD        PHYSICAL EXAMINATION: ECOG PERFORMANCE STATUS: 1 - Symptomatic but completely ambulatory  Vitals:   07/03/23 1110  BP: 106/64  Pulse: 72  Resp: 15  Temp: 98.1 F (36.7 C)  SpO2: 100%   Wt Readings from Last 3 Encounters:  07/03/23  168 lb 6.4 oz (76.4 kg)  06/29/23 169 lb (76.7 kg)  04/28/23 168 lb 3.2 oz (76.3 kg)     GENERAL:alert,  no distress and comfortable SKIN: skin color, texture, turgor are normal, no rashes or significant lesions EYES: normal, Conjunctiva are pink and non-injected, sclera clear NECK: supple, thyroid  normal size, non-tender, without nodularity LYMPH:  no palpable lymphadenopathy in the cervical, axillary  LUNGS: clear to auscultation and percussion with normal breathing effort HEART: regular rate & rhythm and no murmurs and no lower extremity edema ABDOMEN:abdomen soft, non-tender and normal bowel sounds Musculoskeletal:no cyanosis of digits and no clubbing  NEURO: alert & oriented x 3 with fluent speech, no focal motor/sensory deficits BREAST: Left breast incision site healed well with scar tissue on the lateral aspect, coloration normal.      LABORATORY DATA:  I have reviewed the data as listed    Latest Ref Rng & Units 07/03/2023   11:00 AM 04/25/2023   11:36 AM 01/19/2023    2:53 PM  CBC  WBC 4.0 - 10.5 K/uL 5.6  4.8  5.2   Hemoglobin 12.0 - 15.0 g/dL 8.4  88.9  88.3   Hematocrit 36.0 - 46.0 % 26.3  34.6  35.5   Platelets 150 - 400 K/uL 269  272.0  270.0         Latest Ref Rng & Units 04/25/2023   11:36 AM 01/19/2023    2:53 PM 01/12/2023   12:27 PM  CMP  Glucose 70 - 99 mg/dL 91  99  85   BUN 6 - 23 mg/dL 14  24  15    Creatinine 0.40 - 1.20 mg/dL 9.34  9.28  9.11   Sodium 135 - 145 mEq/L 137  138  140   Potassium 3.5 - 5.1 mEq/L 4.4  4.3  3.8   Chloride 96 - 112 mEq/L 101  101  103   CO2 19 - 32 mEq/L 29  29  31    Calcium  8.4 - 10.5 mg/dL 8.8  8.9  9.3   Total Protein 6.0 - 8.3 g/dL 6.1  6.0  6.1   Total Bilirubin 0.2 - 1.2 mg/dL 0.2  0.2  0.3   Alkaline Phos 39 - 117 U/L 49  76  78   AST 0 - 37 U/L 16  14  15    ALT 0 - 35 U/L 13  12  15        RADIOGRAPHIC STUDIES: I have personally reviewed the radiological images as listed and agreed with the findings in the  report. No results found.    Orders Placed This Encounter  Procedures   MR BREAST BILATERAL W WO CONTRAST INC CAD    Standing Status:   Future    Expected Date:   10/08/2023    Expiration Date:   07/02/2024    If indicated for the ordered procedure, I authorize the administration of contrast media per Radiology protocol:   Yes    What is the patient's sedation requirement?:   No Sedation    Does the patient have a pacemaker or implanted devices?:   No    Radiology Contrast Protocol - do NOT remove file path:   [email or web address]    Preferred imaging location?:   [facility] (table limit-550lbs)   Ferritin    Standing Status:   Standing    Number of Occurrences:   20    Expiration Date:   07/02/2024   Vitamin B12    Standing Status:   Future    Expected Date:   07/03/2023    Expiration Date:   07/02/2024   Folate, Serum  Standing Status:   Future    Expiration Date:   07/02/2024   Retic Panel    Standing Status:   Future    Expiration Date:   07/02/2024   All questions were answered. The patient knows to call the clinic with any problems, questions or concerns. No barriers to learning was detected. The total time spent in the appointment was 30 minutes.     Onita Mattock, MD 07/03/2023

## 2023-07-03 NOTE — Telephone Encounter (Signed)
 Received a fax from  [employer] regarding an approval for FASENRA  patient assistance from 07/02/2023 to 06/19/2024. Approval letter sent to scan center. MyChart message sent to patient with update  Phone #: [phone] Fax #: [phone] Patient ID: [id number]  Sherry Pennant, PharmD, MPH, BCPS, CPP Clinical Pharmacist (Rheumatology and Pulmonology)

## 2023-07-04 ENCOUNTER — Telehealth: Payer: Self-pay

## 2023-07-04 NOTE — Telephone Encounter (Addendum)
 Called patient to relay message below as per Dr. Lanny. Patient voiced full understanding will set patient up to be seen at [facility] infusion center. Made patient aware that [facility] woud be contacting her with the schedule.   ----- Message from Onita Lanny sent at 07/04/2023  8:31 AM EST ----- Please let pt know her iron  level is low and I recommend iv iron , please schedule injectafer  750mg  once in next few weeks. Her last dose was given in 2022, make sure it's still approved, OK to change venofer  800-1000mg  if needed, thanks   Onita Lanny

## 2023-07-05 ENCOUNTER — Encounter: Payer: Self-pay | Admitting: Cardiology

## 2023-07-05 ENCOUNTER — Other Ambulatory Visit: Payer: Self-pay

## 2023-07-05 NOTE — Progress Notes (Signed)
 Orders given by Dr. Maryalice Smaller for [facility].  [facility] does not give Injectafer ; therefore, Venofer  300mg  x 3 doses was ordered for [facility] for an equivalent total dose of 900 mg.    Sonja [place], MD  Ascencion Lava, RN Cc: Arlyss Laming, CMA Please let pt know her iron  level is low and I recommend iv iron , please schedule injectafer  750mg  once in next few weeks. Her last dose was given in 2022, make sure it's still approved, OK to change venofer  800-1000mg  if needed, thanks  Sonja Evergreen

## 2023-07-06 ENCOUNTER — Telehealth: Payer: Self-pay | Admitting: Pharmacy Technician

## 2023-07-06 NOTE — Telephone Encounter (Addendum)
Auth Submission: NO AUTH NEEDED Site of care: Site of care: [facility] Payer: [employer] Medication & CPT/J Code(s) submitted: Venofer Route of submission (phone, fax, portal):  Phone # Fax # Auth type: Buy/Bill PB Units/visits requested: 3 doses Reference number:  Approval from: 07/06/23 to 12/04/23

## 2023-07-10 ENCOUNTER — Telehealth: Payer: Self-pay | Admitting: Pharmacy Technician

## 2023-07-10 ENCOUNTER — Telehealth: Payer: Self-pay | Admitting: Cardiology

## 2023-07-10 ENCOUNTER — Other Ambulatory Visit (HOSPITAL_COMMUNITY): Payer: Self-pay

## 2023-07-10 NOTE — Telephone Encounter (Signed)
Needs more information for the prior auth on Repatha

## 2023-07-10 NOTE — Telephone Encounter (Signed)
Pharmacy Patient Advocate Encounter   Received notification from Patient Advice Request messages that prior authorization for repatha is required/requested.   Insurance verification completed.   The patient is insured through [facility] ADVANTAGE/RX ADVANCE .   Per test claim: PA required; PA submitted to above mentioned insurance via CoverMyMeds Key/confirmation #/EOC [id number] Status is pending

## 2023-07-10 NOTE — Telephone Encounter (Signed)
FWD to PA team for assistance  Call is from [employer]  Per chart review PA for Repatha submitted today

## 2023-07-11 ENCOUNTER — Other Ambulatory Visit (HOSPITAL_COMMUNITY): Payer: Self-pay

## 2023-07-11 NOTE — Telephone Encounter (Signed)
Pharmacy Patient Advocate Encounter  Received notification from [facility] [employer]/RX ADVANCE that Prior Authorization for repatha has been APPROVED from 07/11/23 to 07/10/24. Ran test claim, Copay is $47.00 one month. This test claim was processed through [facility]- copay amounts may vary at other pharmacies due to pharmacy/plan contracts, or as the patient moves through the different stages of their insurance plan.   PA #/Case ID/Reference #: [id number]

## 2023-07-11 NOTE — Telephone Encounter (Signed)
Attempted to call patient, no answer left detailed message informing patient PA for Repatha approved, can contact pharmacy regarding copayment and availability of Rx,

## 2023-07-12 ENCOUNTER — Ambulatory Visit: Payer: PPO

## 2023-07-12 VITALS — BP 109/64 | HR 75 | Temp 98.8°F | Resp 16 | Ht 61.0 in | Wt 168.4 lb

## 2023-07-12 DIAGNOSIS — D509 Iron deficiency anemia, unspecified: Secondary | ICD-10-CM

## 2023-07-12 DIAGNOSIS — C50912 Malignant neoplasm of unspecified site of left female breast: Secondary | ICD-10-CM

## 2023-07-12 DIAGNOSIS — D649 Anemia, unspecified: Secondary | ICD-10-CM

## 2023-07-12 DIAGNOSIS — C50512 Malignant neoplasm of lower-outer quadrant of left female breast: Secondary | ICD-10-CM

## 2023-07-12 MED ORDER — DIPHENHYDRAMINE HCL 25 MG PO CAPS
25.0000 mg | ORAL_CAPSULE | Freq: Once | ORAL | Status: AC
Start: 2023-07-12 — End: 2023-07-12
  Administered 2023-07-12: 25 mg via ORAL
  Filled 2023-07-12: qty 1

## 2023-07-12 MED ORDER — IRON SUCROSE 300 MG IVPB - SIMPLE MED
300.0000 mg | Freq: Once | Status: DC
Start: 2023-07-12 — End: 2023-07-12

## 2023-07-12 MED ORDER — ACETAMINOPHEN 325 MG PO TABS
650.0000 mg | ORAL_TABLET | Freq: Once | ORAL | Status: AC
Start: 2023-07-12 — End: 2023-07-12
  Administered 2023-07-12: 650 mg via ORAL
  Filled 2023-07-12: qty 2

## 2023-07-12 MED ORDER — SODIUM CHLORIDE 0.9 % IV SOLN
300.0000 mg | Freq: Once | INTRAVENOUS | Status: AC
  Administered 2023-07-12: 300 mg via INTRAVENOUS
  Filled 2023-07-12: qty 15

## 2023-07-12 NOTE — Progress Notes (Signed)
Diagnosis: Iron Deficiency Anemia  Provider:  Chilton Greathouse MD  Procedure: IV Infusion  IV Type: Peripheral, IV Location: L Upper Arm  Venofer (Iron Sucrose), Dose: 300 mg  Infusion Start Time: 1411  Infusion Stop Time: 1556  Post Infusion IV Care: Observation period completed and Peripheral IV Discontinued  Discharge: Condition: Good, Destination: Home . AVS Provided  Performed by:  Rico Ala, LPN

## 2023-07-18 ENCOUNTER — Telehealth: Payer: Self-pay | Admitting: Internal Medicine

## 2023-07-18 NOTE — Telephone Encounter (Signed)
Error

## 2023-07-19 ENCOUNTER — Ambulatory Visit: Payer: PPO | Admitting: *Deleted

## 2023-07-19 VITALS — BP 95/61 | HR 75 | Temp 98.2°F | Resp 12 | Ht 61.0 in | Wt 167.2 lb

## 2023-07-19 DIAGNOSIS — M542 Cervicalgia: Secondary | ICD-10-CM | POA: Diagnosis not present

## 2023-07-19 DIAGNOSIS — D649 Anemia, unspecified: Secondary | ICD-10-CM | POA: Diagnosis not present

## 2023-07-19 DIAGNOSIS — C50912 Malignant neoplasm of unspecified site of left female breast: Secondary | ICD-10-CM

## 2023-07-19 DIAGNOSIS — M545 Low back pain, unspecified: Secondary | ICD-10-CM | POA: Diagnosis not present

## 2023-07-19 DIAGNOSIS — M4120 Other idiopathic scoliosis, site unspecified: Secondary | ICD-10-CM | POA: Diagnosis not present

## 2023-07-19 DIAGNOSIS — D509 Iron deficiency anemia, unspecified: Secondary | ICD-10-CM

## 2023-07-19 DIAGNOSIS — C50512 Malignant neoplasm of lower-outer quadrant of left female breast: Secondary | ICD-10-CM

## 2023-07-19 MED ORDER — ACETAMINOPHEN 325 MG PO TABS: 650.0000 mg | ORAL_TABLET | Freq: Once | ORAL | Status: DC

## 2023-07-19 MED ORDER — IRON SUCROSE 300 MG IVPB - SIMPLE MED: 300.0000 mg | Freq: Once | Status: DC

## 2023-07-19 MED ORDER — SODIUM CHLORIDE 0.9 % IV SOLN
300.0000 mg | Freq: Once | INTRAVENOUS | Status: AC
  Administered 2023-07-19: 300 mg via INTRAVENOUS
  Filled 2023-07-19: qty 15

## 2023-07-19 MED ORDER — DIPHENHYDRAMINE HCL 25 MG PO CAPS: 25.0000 mg | ORAL_CAPSULE | Freq: Once | ORAL | Status: DC

## 2023-07-19 NOTE — Progress Notes (Signed)
Diagnosis: Iron Deficiency Anemia  Provider:  Chilton Greathouse MD  Procedure: IV Infusion  IV Type: Peripheral, IV Location: L Forearm  Venofer (Iron Sucrose), Dose: 300 mg  Infusion Start Time: 1049 am  Infusion Stop Time: 1245 pm  Post Infusion IV Care: Observation period completed and Peripheral IV Discontinued  Discharge: Condition: Good, Destination: Home . AVS Declined  Performed by:  Forrest Moron, RN

## 2023-07-20 ENCOUNTER — Telehealth: Payer: Self-pay | Admitting: Pediatric Endocrinology

## 2023-07-20 ENCOUNTER — Encounter (HOSPITAL_COMMUNITY): Payer: Self-pay

## 2023-07-20 DIAGNOSIS — R609 Edema, unspecified: Secondary | ICD-10-CM

## 2023-07-20 DIAGNOSIS — R0609 Other forms of dyspnea: Secondary | ICD-10-CM

## 2023-07-20 NOTE — Telephone Encounter (Signed)
Edema x months Doe x weeks She did have diast dysfn on echo agu 2024 Does not sound like an asthma or pulmonary issuee  Plan - check bnp, d-dimer troponin - all orpdered. I will follow over weekend - if worse go to ER      Current Outpatient Medications:    acetaminophen (TYLENOL) 650 MG CR tablet, Take 650 mg by mouth every 8 (eight) hours as needed for pain., Disp: , Rfl:    albuterol (VENTOLIN HFA) 108 (90 Base) MCG/ACT inhaler, Inhale 1-2 puffs into the lungs every 6 (six) hours as needed., Disp: 8 g, Rfl: 2   ascorbic acid (VITAMIN C) 500 MG tablet, Take 500 mg by mouth daily., Disp: , Rfl:    aspirin EC 81 MG tablet, Take 81 mg by mouth daily. Swallow whole., Disp: , Rfl:    Benralizumab (FASENRA PEN) 30 MG/ML SOAJ, Inject 1 mL (30 mg total) into the skin every 8 (eight) weeks., Disp: 1 mL, Rfl: 2   budesonide (ENTOCORT EC) 3 MG 24 hr capsule, TAKE 1 CAPSULE (3 MG TOTAL) BY MOUTH DAILY., Disp: 30 capsule, Rfl: 1   Calcium-Magnesium-Vitamin D (CALCIUM MAGNESIUM PO), Take 1 tablet by mouth at bedtime., Disp: , Rfl:    Cholecalciferol (VITAMIN D3 ULTRA STRENGTH) 125 MCG (5000 UT) capsule, Take 5,000 Units by mouth daily., Disp: , Rfl:    clonazePAM (KLONOPIN) 0.5 MG tablet, Take 1 tablet (0.5 mg total) by mouth daily., Disp: 30 tablet, Rfl: 1   denosumab (PROLIA) 60 MG/ML SOSY injection, Inject 60 mg into the skin every 6 (six) months., Disp: , Rfl:    doxepin (SINEQUAN) 25 MG capsule, Take 1 capsule (25 mg total) by mouth at bedtime., Disp: 30 capsule, Rfl: 3   Evolocumab (REPATHA SURECLICK) 140 MG/ML SOAJ, Inject 140 mg into the skin every 14 (fourteen) days., Disp: 2 mL, Rfl: 11   famotidine (PEPCID) 20 MG tablet, TAKE 1 TABLET BY MOUTH TWICE A DAY, Disp: 60 tablet, Rfl: 11   ibuprofen (ADVIL) 200 MG tablet, Take 400 mg by mouth every 6 (six) hours as needed for moderate pain., Disp: , Rfl:    ipratropium-albuterol (DUONEB) 0.5-2.5 (3) MG/3ML SOLN, Take 3 mLs by nebulization every 6  (six) hours as needed., Disp: 120 mL, Rfl: 5   metFORMIN (GLUCOPHAGE) 500 MG tablet, Take 1 tablet (500 mg total) by mouth 2 (two) times daily with a meal., Disp: 60 tablet, Rfl: 2   nitroGLYCERIN (NITROSTAT) 0.4 MG SL tablet, Place 1 tablet (0.4 mg total) under the tongue every 5 (five) minutes as needed for chest pain., Disp: 25 tablet, Rfl: 11   pindolol (VISKEN) 5 MG tablet, Take 1 tablet (5 mg total) by mouth daily after breakfast., Disp: 30 tablet, Rfl: 3   risperiDONE (RISPERDAL) 0.5 MG tablet, Take 1 tablet (0.5 mg total) by mouth daily. Take 1 mg Risperdal tab at bedtime, Disp: 60 tablet, Rfl: 2   risperiDONE (RISPERDAL) 1 MG tablet, Take 1 tablet (1 mg total) by mouth at bedtime., Disp: 30 tablet, Rfl: 2   tamoxifen (NOLVADEX) 20 MG tablet, TAKE 1 TABLET (20 MG TOTAL) BY MOUTH DAILY., Disp: 30 tablet, Rfl: 3   venlafaxine XR (EFFEXOR XR) 75 MG 24 hr capsule, Take 3 capsules (225 mg total) by mouth daily with breakfast., Disp: 90 capsule, Rfl: 1  Current Facility-Administered Medications:    [START ON 11/06/2023] denosumab (PROLIA) injection 60 mg, 60 mg, Subcutaneous, Once, Bradd Canary, MD

## 2023-07-20 NOTE — Telephone Encounter (Signed)
Spoke with the pt  She states having increased DOE over the past 2-3 wks  Not wheezing or coughing  Her feet and ankles have been swollen over the past 6 months  She does not have any fevers, aches She is taking her albuterol inhaler and her fasenra  She says that she never picked up asmanex rx  She is scheduled for appt with Mr FOR 08/01/22 None of the provider including APPS have appt openings sooner  MR- please advise, thanks!  Allergies  Allergen Reactions   Latuda [Lurasidone Hcl] Other (See Comments)    PER THE PT CAUSED RESTLESSNESS   Beclomethasone Dipropionate Hives and Other (See Comments)     weight gain   Flexeril [Cyclobenzaprine] Anxiety   Mometasone Furo-Formoterol Fum Hives and Other (See Comments)    weight gain   Sulfonamide Derivatives Hives and Rash   Statins Other (See Comments)    Myalgias, RLS

## 2023-07-20 NOTE — Telephone Encounter (Signed)
PT states she has a SOB appt w/Dr. Marchelle Gearing but feels she should not wait. Can Dr. Get her in sooner. No appts avail. Her # is [phone]

## 2023-07-21 ENCOUNTER — Encounter: Payer: Self-pay | Admitting: Internal Medicine

## 2023-07-21 NOTE — Telephone Encounter (Signed)
ATC x1.  Left detailed message (DPR).  Advised that she would need to go to the ER if she got worse over the weekend and to call us back on Monday after 8:30 am since our office is now closed so we could discuss the lab work that Dr. Marchelle Gearing would like to get.

## 2023-07-24 NOTE — Telephone Encounter (Signed)
I called and spoke with the pt and notified of response per MR  She is not feeling any better and now feels fatigued and has had fever off and on for 2 days  Acute visit with Hunsucker scheduled for tomorrow  Nothing further needed

## 2023-07-25 ENCOUNTER — Encounter: Payer: Self-pay | Admitting: Pulmonary Disease

## 2023-07-25 ENCOUNTER — Ambulatory Visit: Payer: PPO | Admitting: Pulmonary Disease

## 2023-07-25 DIAGNOSIS — R609 Edema, unspecified: Secondary | ICD-10-CM | POA: Diagnosis not present

## 2023-07-25 DIAGNOSIS — R0609 Other forms of dyspnea: Secondary | ICD-10-CM | POA: Diagnosis not present

## 2023-07-25 DIAGNOSIS — R509 Fever, unspecified: Secondary | ICD-10-CM

## 2023-07-25 DIAGNOSIS — J4550 Severe persistent asthma, uncomplicated: Secondary | ICD-10-CM | POA: Diagnosis not present

## 2023-07-25 LAB — D-DIMER, QUANTITATIVE: D-Dimer, Quant: 6.16 ug{FEU}/mL — ABNORMAL HIGH (ref <0.50)

## 2023-07-25 LAB — BRAIN NATRIURETIC PEPTIDE: Pro B Natriuretic peptide (BNP): 36 pg/mL (ref 0.0–100.0)

## 2023-07-25 LAB — TROPONIN I: Troponin I: 3 ng/L (ref <=47)

## 2023-07-25 NOTE — Patient Instructions (Signed)
 Nice to see you again  I think most likely cause of your shortness of breath is worsened over the last couple of months is related to worsening anemia.  It sounds like you have had an illness at least over the weekend but possibly through the middle of last week.  And things are getting better.  Some things are getting better and the duration of symptoms is so long, the utility for testing is minimal as we would not prescribe medicines to treat flu or COVID etc. at this point.  I think we should get the blood work to Dr. Geronimo ordered.  This is looking for stress on the heart that may indicate changes in the heart are responsible for your shortness of breath and will be screened for something like a blood clot in the lungs.  If one of the lab test returns abnormal we will need to order a CT scan of your chest with contrast to look for blood clots, we would need to get this done the next day or 2 after the results of the blood work.  Hopefully this will turn normal and we do not need to worry about it.  Return to clinic next week as previously scheduled with Dr. Geronimo

## 2023-07-25 NOTE — Progress Notes (Signed)
 @Patient  ID: [name], female    DOB: 1955/10/15, 68 y.o.   MRN: [id number]  Chief Complaint  Patient presents with   Acute Visit    Pt states she started having SOB.SABRA then by sat. Fever mostly in the am now    Referring provider: Mannam, Praveen, MD  HPI:   68 y.o. woman with asthma well-controlled now on biologic therapy Fasenra  whom we are seeing for evaluation of acute on chronic dyspnea on exertion.  Multiple prior pulmonary notes reviewed.  Most recent oncology/hematology note reviewed.  She notes chills about 6 days ago.  Lasted 3-4 nights.  Finally took her temperature over the weekend.  Febrile.  Frequency of fever is decreasing.  Felt fine this morning but had a fever of 101.8 Fahrenheit.  Initially some worsening dyspnea on the first 2 or 3 days but now improved over the last couple of days.  No real issue.  Back to baseline.  She has asthma but rare albuterol  use.  Not using a maintenance inhaler.  Her chief complaint really is gradual worsening of dyspnea on exertion over the last several months.  Really over the last 2 to 3 months with seem to be worsening in the months preceding that.  More fatigue shortness of breath.  Trying to increase exercise but cannot.  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  Even with trying to increase cardiovascular endurance.  Does not feel like her asthma.  She has no wheezing.  No chest tightness.  No symptoms classic for her prior asthma symptoms.  She does have a diagnosis of anemia.  His started iron  infusions recently.  Noted that her hemoglobin went from 11 in 04/2023 to 8 in 06/2023.  We discussed at length likely this is the cause of her worsening symptoms.  We discussed getting labs as directed by her pulmonologist over the last couple of days which is reasonable to make sure not missing other etiologies.  Risk benefits discussed.   Questionaires / Pulmonary Flowsheets:   ACT:  Asthma Control Test ACT Total Score   12/23/2022  2:53 PM 19  03/07/2022 11:27 AM 14  09/06/2021  1:28 PM 22    MMRC:     No data to display          Epworth:      No data to display          Tests:   FENO:  Lab Results  Component Value Date   NITRICOXIDE 42 04/21/2017    PFT:    Latest Ref Rng & Units 10/19/2017   10:47 AM  PFT Results  FVC-Pre L 2.65   FVC-Predicted Pre % 86   FVC-Post L 2.57   FVC-Predicted Post % 84   Pre FEV1/FVC % % 67   Post FEV1/FCV % % 70   FEV1-Pre L 1.79   FEV1-Predicted Pre % 75   FEV1-Post L 1.80   DLCO uncorrected ml/min/mmHg 19.51   DLCO UNC% % 90   DLVA Predicted % 104   TLC L 4.90   TLC % Predicted % 103   RV % Predicted % 118   Personally viewed interpreted as borderline spirometry, mild obstruction if present, no bronchodilator response, lung volumes within normal limits, DLCO within normal limits  WALK:     09/01/2011   11:13 AM  SIX MIN WALK  Supplimental Oxygen  during Test? (L/min) No  Tech Comments: pt tolerated walk well     Imaging: Personally reviewed  and as per EMR and discussion in this note No results found.  Lab Results: Personally reviewed CBC    Component Value Date/Time   WBC 5.6 07/03/2023 1100   WBC 4.8 04/25/2023 1136   RBC 3.16 (L) 07/03/2023 1156   RBC 3.14 (L) 07/03/2023 1100   HGB 8.4 (L) 07/03/2023 1100   HGB 12.9 09/23/2021 1321   HCT 26.3 (L) 07/03/2023 1100   HCT 38.7 09/23/2021 1321   PLT 269 07/03/2023 1100   PLT 303 09/23/2021 1321   MCV 83.8 07/03/2023 1100   MCV 90 09/23/2021 1321   MCH 26.8 07/03/2023 1100   MCHC 31.9 07/03/2023 1100   RDW 13.2 07/03/2023 1100   RDW 13.1 09/23/2021 1321   LYMPHSABS 0.8 07/03/2023 1100   LYMPHSABS 1.3 09/23/2021 1321   MONOABS 0.6 07/03/2023 1100   EOSABS 0.0 07/03/2023 1100   EOSABS 0.0 09/23/2021 1321   BASOSABS 0.0 07/03/2023 1100   BASOSABS 0.0 09/23/2021 1321    BMET    Component Value Date/Time   NA 133 (L) 07/03/2023 1100   NA 135 09/23/2021 1321   K  4.6 07/03/2023 1100   CL 100 07/03/2023 1100   CO2 27 07/03/2023 1100   GLUCOSE 96 07/03/2023 1100   BUN 13 07/03/2023 1100   BUN 21 09/23/2021 1321   CREATININE 0.67 07/03/2023 1100   CALCIUM  8.9 07/03/2023 1100   GFRNONAA >60 07/03/2023 1100   GFRAA >60 08/24/2017 0349    BNP    Component Value Date/Time   BNP 20.8 11/17/2014 1523    ProBNP    Component Value Date/Time   PROBNP 158 04/01/2019 1429   PROBNP 16.0 02/25/2015 1300    Specialty Problems       Pulmonary Problems   Asthma   ASTHMA x age 55 months. ....SABRASABRARamaswamy  -Triggers: harsh winter and viruses Hx of subjective intolerance to ICS - weight gain  -  Prior to singulair  02/1992 -> hospitalized 3-4 times/year. Since then, zero hospitalization/er visits.   - Improved asthma control after moving [place] -> [place] in 2004 . Failed xolair in 2004 per hx  -8 flares & prednisone  bursts May 2008 -> May 2009.    - Commenced daily prednisone  5mg  daily 12/14/2007   - s/p  Itraconazole  05/12/2008 >  10/07/2008 (only 1 flare Rx at home in 4/ 2010)  - 07/14/2009: AE asthma. Rx pred burst and QVAR   - 08/11/2009: start spiriva  and asmanex  - Allergy  profile 09/22/2011 significant mold elevations -Allergy  skin test 10/19/2011 elevations for grass and weed pollens some trees, house dust, tobacco and several molds.  -09/18/2009: AE asthma with self pred taper. Rx pred burst. Double up singulair  at request   # PFTs 2007 -  FEV1 1.75/76%, TLC at 100%, DLCO 82% PFTs 12/04/2007 at end of pred burst - Fev1 1.7/68%, TLC 5.2/104%, DLCO 17.8/77% -> start daily pred Spiro 02/01/2008  - FEv1 1.62/71%,  Spior 04/21/2008 - Fev1 1.35L/53% -> start itraconazole  Spiro 08/01/2008 - Fev1 1.43L/54% ->  cont  itraconazole /daily pred Post itraconazole   11/25/08 - Fev1 1.22L/48% (flare x 2) -> recommence pred & itra -> but she dc'ed itra due to chest pains.  01/08/09- Fev1 1.81L/72% (best ever) 07/14/09 - Fev1 0.98L/36%, Started pred burst and QVAR -> dc qvar  due to chst pain 08/11/09 - Fe1 1.1L/40% -> cont maint pred 10, restart asmanex , start spoiva 09/18/2009: - doubled up singulair  at her request 10/28/2009  - Fev1 1.22L/44% 03/19/2010 - Fev1 1.4L/50% -> abx for  mild AE. Add roflumilast  11/03/2010 - fe1v 1 1.48L/58% and much improved comparted to baseline (not taking darilesp) 05/31/11 - fev2 1.4L/56%, Ratio 60 07/21/2011 - fev1 1.63L/65%, Ratio 65 -start itrconazole 09/01/2011 - Fev1 1.53L/61%, ratio 63 12/15/2011 - fev1 1.4L/55%, Ratio 59          A B P A-ALLERGIC BRONCHOPULMONARY ASPERGILLOSIS   ABPA x 12/14/2007   - IgE 605 on 11/21/2007 (1 mo after steroid burst), Eos 10.4% of 5600 cells, Preciptin positive for fumigatus/nidulans/niger, Asp. skin test positive 11/21/2007, CT scan 2005 &  11/22/2007 unchanged bilateral apical scarring and RUL anterior segment bronchiectasis  - s/p  Itraconazole  05/12/2008 >  10/07/2008 (only 1 flare Rx at home in 4/ 2010) - Spiro 02/01/2008  - FEv1 1.62/71%,  Spior 04/21/2008 - Fev1 1.35L/53% -> start itraconazole  Spiro 08/01/2008 - Fev1 1.43L/54% ->  cont  itraconazole /daily pred Post itraconazole   11/25/08 - Fev1 1.22L/48% (flare x 2) -> recommence pred & itra -> but she dc'ed itra due to chest pains.  09/22/11- Allergy  profile- Total IgE 575.6, significant specific elevations, especially to molds including Aspergillus 10/19/11- Allergy  skin testing- positive especially for molds. 01/08/09- Fev1 1.81L/72% (best ever but off itra at this point)       Gastroesophageal reflux disease with hiatal hernia   Large hiatal hernia on chest CT      OSA (obstructive sleep apnea)   PSG 03/15/06>>AHI 32.6, SpO2 low 85% PSG 02/20/12>>AHI 5.3, SpO2 low 91%, PLMI 42.3.  Minimal supine sleep (had positional effect).  Not on CPAP Mild, might consider a dental appliance      Seasonal and perennial allergic rhinitis   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HCC)   Severe persistent asthma with intensive monitoring   Acute recurrent sinusitis    Bronchitis with asthma, acute    Allergies  Allergen Reactions   Latuda  [Lurasidone  Hcl] Other (See Comments)    PER THE PT CAUSED RESTLESSNESS   Beclomethasone Dipropionate Hives and Other (See Comments)     weight gain   Flexeril  [Cyclobenzaprine ] Anxiety   Mometasone  Furo-Formoterol Fum Hives and Other (See Comments)    weight gain   Sulfonamide Derivatives Hives and Rash   Statins Other (See Comments)    Myalgias, RLS    Immunization History  Administered Date(s) Administered   Influenza Split 04/20/2011, 04/20/2020   Influenza Whole 06/06/2007, 04/15/2008, 04/02/2009, 03/29/2012   Influenza, High Dose Seasonal PF 05/05/2015   Influenza,inj,Quad PF,6+ Mos 05/09/2013, 03/03/2014, 05/03/2016, 04/21/2017, 04/02/2018, 03/19/2019, 04/20/2020, 05/03/2021, 03/07/2022   Influenza-Unspecified 04/17/2023   Moderna Sars-Covid-2 Vaccination 08/15/2019, 09/17/2019, 05/16/2020, 12/04/2020   PNEUMOCOCCAL CONJUGATE-20 04/25/2023   Pfizer Covid-19 Vaccine Bivalent Booster 64yrs & up 04/16/2021   Pneumococcal Conjugate-13 05/09/2013   Pneumococcal Polysaccharide-23 05/04/2005   Td 07/29/2009   Tdap 03/11/2015   Zoster Recombinant(Shingrix) 05/09/2019, 08/01/2019    Past Medical History:  Diagnosis Date   Allergic bronchopulmonary aspergillosis (HCC) 2008   sees Dr Katheren Finn pulmonology   Anemia    iron  deficiency, resolved   Anxiety    Asthma    Breast cancer (HCC)    CAD (coronary artery disease)    a. LHC 6/16:  oOM1 60, pRCA 25 >> med Rx b. cath 3/19 2nd OM with 95% stenosis s/p synergy DES & anomalous RCA   CAP (community acquired pneumonia) 2016; 06/07/2016   Chronic bronchitis (HCC)    Chronic lower back pain    Complication of anesthesia    think I have a hard time waking up from it  Depression    mild   Diverticulitis    Diverticulosis    GERD (gastroesophageal reflux disease)    H/O hiatal hernia    Headache    weekly (08/23/2017)   History of  echocardiogram    Echo 6/16:  Mod LVH, EF 60-65%, no RWMA, Gr 1 DD, trivial MR, normal LA size.   History of radiation therapy    Left breast- 12/26/22-01/23/23-Dr. Lynwood Nasuti   Hyperglycemia 11/20/2015   Hyperlipidemia, mixed 09/11/2007   Qualifier: Diagnosis of  By: Antonio ROSALEA Rockers   Did not tolerate Lipitor, zocor, Lovastatin, Pravastatin, Livalo , Crestor  even low dose    IBS (irritable bowel syndrome)    Maxillary sinusitis    Normal cardiac stress test 11/2011   No evidence of ischemia or infarct.   Calculated ejection fraction 72%.   Obesity    OSA (obstructive sleep apnea) 02/2012   has stopped using  cpap   Osteoarthritis    Osteoporosis    Personal history of radiation therapy    Pneumonia 11/2011   before 2013 I hadn't had pneumonia since I was a child (04/13/2012)   Pulmonary nodules    S/P angioplasty with stent 08/23/17 ostial 2nd OM with DES synnergy 08/24/2017   Schatzki's ring     Tobacco History: Social History   Tobacco Use  Smoking Status Never   Passive exposure: Never  Smokeless Tobacco Never   Counseling given: Not Answered   Continue to not smoke  Outpatient Encounter Medications as of 07/25/2023  Medication Sig   acetaminophen  (TYLENOL ) 650 MG CR tablet Take 650 mg by mouth every 8 (eight) hours as needed for pain.   albuterol  (VENTOLIN  HFA) 108 (90 Base) MCG/ACT inhaler Inhale 1-2 puffs into the lungs every 6 (six) hours as needed.   ascorbic acid (VITAMIN C) 500 MG tablet Take 500 mg by mouth daily.   aspirin  EC 81 MG tablet Take 81 mg by mouth daily. Swallow whole.   Benralizumab  (FASENRA  PEN) 30 MG/ML SOAJ Inject 1 mL (30 mg total) into the skin every 8 (eight) weeks.   budesonide  (ENTOCORT EC ) 3 MG 24 hr capsule TAKE 1 CAPSULE (3 MG TOTAL) BY MOUTH DAILY.   Calcium -Magnesium -Vitamin D  (CALCIUM  MAGNESIUM  PO) Take 1 tablet by mouth at bedtime.   Cholecalciferol  (VITAMIN D3 ULTRA STRENGTH) 125 MCG (5000 UT) capsule Take 5,000 Units by mouth  daily.   clonazePAM  (KLONOPIN ) 0.5 MG tablet Take 1 tablet (0.5 mg total) by mouth daily.   denosumab  (PROLIA ) 60 MG/ML SOSY injection Inject 60 mg into the skin every 6 (six) months.   doxepin  (SINEQUAN ) 25 MG capsule Take 1 capsule (25 mg total) by mouth at bedtime.   Evolocumab  (REPATHA  SURECLICK) 140 MG/ML SOAJ Inject 140 mg into the skin every 14 (fourteen) days.   famotidine  (PEPCID ) 20 MG tablet TAKE 1 TABLET BY MOUTH TWICE A DAY   ibuprofen  (ADVIL ) 200 MG tablet Take 400 mg by mouth every 6 (six) hours as needed for moderate pain.   ipratropium-albuterol  (DUONEB) 0.5-2.5 (3) MG/3ML SOLN Take 3 mLs by nebulization every 6 (six) hours as needed.   metFORMIN  (GLUCOPHAGE ) 500 MG tablet Take 1 tablet (500 mg total) by mouth 2 (two) times daily with a meal.   nitroGLYCERIN  (NITROSTAT ) 0.4 MG SL tablet Place 1 tablet (0.4 mg total) under the tongue every 5 (five) minutes as needed for chest pain.   pindolol  (VISKEN ) 5 MG tablet Take 1 tablet (5 mg total) by mouth daily after breakfast.  risperiDONE  (RISPERDAL ) 0.5 MG tablet Take 1 tablet (0.5 mg total) by mouth daily. Take 1 mg Risperdal  tab at bedtime   risperiDONE  (RISPERDAL ) 1 MG tablet Take 1 tablet (1 mg total) by mouth at bedtime.   tamoxifen  (NOLVADEX ) 20 MG tablet TAKE 1 TABLET (20 MG TOTAL) BY MOUTH DAILY.   venlafaxine  XR (EFFEXOR  XR) 75 MG 24 hr capsule Take 3 capsules (225 mg total) by mouth daily with breakfast.   Facility-Administered Encounter Medications as of 07/25/2023  Medication   [START ON 11/06/2023] denosumab  (PROLIA ) injection 60 mg     Review of Systems  Review of Systems  No chest pain with exertion.  No orthopnea or PND.  Comprehensive review of systems otherwise negative. Physical Exam  BP 120/77 (BP Location: Left Arm, Patient Position: Sitting, Cuff Size: Normal)   Pulse 89   Temp 97.9 F (36.6 C) (Oral)   Ht 5' 1 (1.549 m)   Wt 168 lb 3.2 oz (76.3 kg)   SpO2 100%   BMI 31.78 kg/m   Wt Readings from  Last 5 Encounters:  07/25/23 168 lb 3.2 oz (76.3 kg)  07/19/23 167 lb 3.2 oz (75.8 kg)  07/12/23 168 lb 6.4 oz (76.4 kg)  07/03/23 168 lb 6.4 oz (76.4 kg)  06/29/23 169 lb (76.7 kg)    BMI Readings from Last 5 Encounters:  07/25/23 31.78 kg/m  07/19/23 31.59 kg/m  07/12/23 31.82 kg/m  07/03/23 31.82 kg/m  06/29/23 31.93 kg/m     Physical Exam General: Sitting in chair, no acute distress Eyes: EOMI, no icterus Neck: Supple, no JVP Pulmonary: Clear, normal work of breathing Cardiovascular: Regular rate and rhythm, no murmur Abdomen: Nondistended, bowel sounds present MSK: No synovitis, no joint effusion Neuro: Normal gait, no weakness Psych: Normal mood, full affect   Assessment & Plan:   Dyspnea on exertion: Acute on chronic.  A bit worse over the preceding days with febrile illness now improving with time and as other symptoms of illness improved.  But certainly seems to be worse in the last couple of months.  Reviewed in detail trend of hemoglobin from 11-8 from 05/10/2023 to 07/10/2023.  This would explain her fatigue shortness of breath etc.  Her lack of wheezing or classic asthma symptoms, not having any of these associate with dyspnea exertion.  I think this largely explains her symptoms.  She has no hypoxemia.  She is not tachycardic.  Previous lab workup from primary pulmonologist encouraged to complete today, D-dimer, proBNP, troponin.  Expressed that if D-dimer is elevated we are obligated to get a CTA PE protocol to evaluate for PE.  She expressed understanding.  If proBNP and troponin are elevated and no evidence of PE on labs or CT scan, would be an indicator of the possible development of heart failure.  We would need extra help from cardiology etc.  She expressed understanding.  Fever: Sound like upper respiratory illness.  On minimal symptoms.  Not exacerbating asthma.  Chills started approximately 5 to 6 days ago.  Out of the window for Tamiflu , out of the window  for anterior viral's for COVID.  Frankly she is having improvement spontaneously so would not want to prescribe any way.  No role for testing for viral illnesses given would not change management.  Severe persistent asthma: Well-controlled symptoms on Fasenra .  Uses albuterol  rarely.  No maintenance inhaler.  Prescribed Asmanex  last visit 04/2023 but not pick up because was too expensive.  Could consider adding maintenance inhaler in  the future if dyspnea persists and workup above is negative.  No follow-ups on file.  Keep previously scheduled follow-up with Dr. Geronimo next week.   Donnice JONELLE Beals, MD 07/25/2023   This appointment required 41 minutes of patient care (this includes precharting, chart review, review of results, face-to-face care, etc.).

## 2023-07-26 ENCOUNTER — Ambulatory Visit: Payer: PPO

## 2023-07-27 ENCOUNTER — Encounter: Payer: PPO | Admitting: Family Medicine

## 2023-07-27 ENCOUNTER — Ambulatory Visit: Payer: PPO

## 2023-07-28 ENCOUNTER — Inpatient Hospital Stay (HOSPITAL_BASED_OUTPATIENT_CLINIC_OR_DEPARTMENT_OTHER)
Admission: EM | Admit: 2023-07-28 | Discharge: 2023-07-30 | DRG: 176 | Disposition: A | Discharge status: Home / Self Care | Payer: PPO | Attending: Internal Medicine | Admitting: Internal Medicine

## 2023-07-28 ENCOUNTER — Emergency Department (HOSPITAL_BASED_OUTPATIENT_CLINIC_OR_DEPARTMENT_OTHER): Payer: PPO

## 2023-07-28 ENCOUNTER — Encounter (HOSPITAL_BASED_OUTPATIENT_CLINIC_OR_DEPARTMENT_OTHER): Payer: Self-pay | Admitting: Emergency Medicine

## 2023-07-28 ENCOUNTER — Other Ambulatory Visit: Payer: Self-pay

## 2023-07-28 DIAGNOSIS — I2510 Atherosclerotic heart disease of native coronary artery without angina pectoris: Secondary | ICD-10-CM | POA: Diagnosis present

## 2023-07-28 DIAGNOSIS — Z923 Personal history of irradiation: Secondary | ICD-10-CM

## 2023-07-28 DIAGNOSIS — E669 Obesity, unspecified: Secondary | ICD-10-CM | POA: Diagnosis present

## 2023-07-28 DIAGNOSIS — M810 Age-related osteoporosis without current pathological fracture: Secondary | ICD-10-CM | POA: Diagnosis present

## 2023-07-28 DIAGNOSIS — F32A Depression, unspecified: Secondary | ICD-10-CM | POA: Diagnosis present

## 2023-07-28 DIAGNOSIS — I2602 Saddle embolus of pulmonary artery with acute cor pulmonale: Secondary | ICD-10-CM | POA: Diagnosis not present

## 2023-07-28 DIAGNOSIS — K52832 Lymphocytic colitis: Secondary | ICD-10-CM | POA: Diagnosis present

## 2023-07-28 DIAGNOSIS — Z8249 Family history of ischemic heart disease and other diseases of the circulatory system: Secondary | ICD-10-CM

## 2023-07-28 DIAGNOSIS — Z79810 Long term (current) use of selective estrogen receptor modulators (SERMs): Secondary | ICD-10-CM

## 2023-07-28 DIAGNOSIS — Z7901 Long term (current) use of anticoagulants: Secondary | ICD-10-CM

## 2023-07-28 DIAGNOSIS — Z820 Family history of epilepsy and other diseases of the nervous system: Secondary | ICD-10-CM

## 2023-07-28 DIAGNOSIS — E249 Cushing's syndrome, unspecified: Secondary | ICD-10-CM | POA: Diagnosis present

## 2023-07-28 DIAGNOSIS — J4489 Other specified chronic obstructive pulmonary disease: Secondary | ICD-10-CM | POA: Diagnosis present

## 2023-07-28 DIAGNOSIS — R918 Other nonspecific abnormal finding of lung field: Secondary | ICD-10-CM | POA: Diagnosis not present

## 2023-07-28 DIAGNOSIS — F329 Major depressive disorder, single episode, unspecified: Secondary | ICD-10-CM | POA: Diagnosis present

## 2023-07-28 DIAGNOSIS — E782 Mixed hyperlipidemia: Secondary | ICD-10-CM | POA: Diagnosis present

## 2023-07-28 DIAGNOSIS — R0602 Shortness of breath: Principal | ICD-10-CM

## 2023-07-28 DIAGNOSIS — Z803 Family history of malignant neoplasm of breast: Secondary | ICD-10-CM

## 2023-07-28 DIAGNOSIS — Z7985 Long-term (current) use of injectable non-insulin antidiabetic drugs: Secondary | ICD-10-CM

## 2023-07-28 DIAGNOSIS — J8283 Eosinophilic asthma: Secondary | ICD-10-CM | POA: Diagnosis present

## 2023-07-28 DIAGNOSIS — J9811 Atelectasis: Secondary | ICD-10-CM | POA: Diagnosis not present

## 2023-07-28 DIAGNOSIS — Z8041 Family history of malignant neoplasm of ovary: Secondary | ICD-10-CM

## 2023-07-28 DIAGNOSIS — C50919 Malignant neoplasm of unspecified site of unspecified female breast: Secondary | ICD-10-CM | POA: Diagnosis present

## 2023-07-28 DIAGNOSIS — Z833 Family history of diabetes mellitus: Secondary | ICD-10-CM

## 2023-07-28 DIAGNOSIS — R509 Fever, unspecified: Secondary | ICD-10-CM | POA: Diagnosis not present

## 2023-07-28 DIAGNOSIS — I2699 Other pulmonary embolism without acute cor pulmonale: Principal | ICD-10-CM

## 2023-07-28 DIAGNOSIS — I5032 Chronic diastolic (congestive) heart failure: Secondary | ICD-10-CM | POA: Diagnosis present

## 2023-07-28 DIAGNOSIS — Z882 Allergy status to sulfonamides status: Secondary | ICD-10-CM

## 2023-07-28 DIAGNOSIS — K449 Diaphragmatic hernia without obstruction or gangrene: Secondary | ICD-10-CM | POA: Diagnosis not present

## 2023-07-28 DIAGNOSIS — Z6831 Body mass index (BMI) 31.0-31.9, adult: Secondary | ICD-10-CM

## 2023-07-28 DIAGNOSIS — Z7984 Long term (current) use of oral hypoglycemic drugs: Secondary | ICD-10-CM

## 2023-07-28 DIAGNOSIS — Z7982 Long term (current) use of aspirin: Secondary | ICD-10-CM

## 2023-07-28 DIAGNOSIS — Z832 Family history of diseases of the blood and blood-forming organs and certain disorders involving the immune mechanism: Secondary | ICD-10-CM

## 2023-07-28 DIAGNOSIS — E119 Type 2 diabetes mellitus without complications: Secondary | ICD-10-CM | POA: Diagnosis present

## 2023-07-28 DIAGNOSIS — K219 Gastro-esophageal reflux disease without esophagitis: Secondary | ICD-10-CM | POA: Diagnosis present

## 2023-07-28 DIAGNOSIS — Z955 Presence of coronary angioplasty implant and graft: Secondary | ICD-10-CM

## 2023-07-28 DIAGNOSIS — D509 Iron deficiency anemia, unspecified: Secondary | ICD-10-CM | POA: Diagnosis present

## 2023-07-28 DIAGNOSIS — F419 Anxiety disorder, unspecified: Secondary | ICD-10-CM | POA: Diagnosis present

## 2023-07-28 DIAGNOSIS — B441 Other pulmonary aspergillosis: Secondary | ICD-10-CM | POA: Diagnosis present

## 2023-07-28 DIAGNOSIS — Z79899 Other long term (current) drug therapy: Secondary | ICD-10-CM

## 2023-07-28 DIAGNOSIS — Z888 Allergy status to other drugs, medicaments and biological substances status: Secondary | ICD-10-CM

## 2023-07-28 DIAGNOSIS — Z8262 Family history of osteoporosis: Secondary | ICD-10-CM

## 2023-07-28 LAB — BASIC METABOLIC PANEL
Anion gap: 8 (ref 5–15)
BUN: 13 mg/dL (ref 8–23)
CO2: 26 mmol/L (ref 22–32)
Calcium: 8.8 mg/dL — ABNORMAL LOW (ref 8.9–10.3)
Chloride: 103 mmol/L (ref 98–111)
Creatinine, Ser: 0.67 mg/dL (ref 0.44–1.00)
GFR, Estimated: >60 mL/min (ref >60)
Glucose, Bld: 100 mg/dL — ABNORMAL HIGH (ref 70–99)
Potassium: 3.6 mmol/L (ref 3.5–5.1)
Sodium: 137 mmol/L (ref 135–145)

## 2023-07-28 LAB — CBC
HCT: 30.5 % — ABNORMAL LOW (ref 36.0–46.0)
Hemoglobin: 9.4 g/dL — ABNORMAL LOW (ref 12.0–15.0)
MCH: 26.8 pg (ref 26.0–34.0)
MCHC: 30.8 g/dL (ref 30.0–36.0)
MCV: 86.9 fL (ref 80.0–100.0)
Platelets: 330 10*3/uL (ref 150–400)
RBC: 3.51 MIL/uL — ABNORMAL LOW (ref 3.87–5.11)
RDW: 18.1 % — ABNORMAL HIGH (ref 11.5–15.5)
WBC: 5.2 10*3/uL (ref 4.0–10.5)
nRBC: 0 % (ref 0.0–0.2)

## 2023-07-28 LAB — BRAIN NATRIURETIC PEPTIDE: B Natriuretic Peptide: 26.2 pg/mL (ref 0.0–100.0)

## 2023-07-28 MED ORDER — RISPERIDONE 0.5 MG PO TABS
1.0000 mg | ORAL_TABLET | Freq: Once | ORAL | Status: AC
  Administered 2023-07-28: 1 mg via ORAL
  Filled 2023-07-28: qty 2

## 2023-07-28 MED ORDER — HEPARIN (PORCINE) 25000 UT/250ML-% IV SOLN
1100.0000 [IU]/h | INTRAVENOUS | Status: DC
  Administered 2023-07-28: 1150 [IU]/h via INTRAVENOUS
  Administered 2023-07-29: 1100 [IU]/h via INTRAVENOUS
  Filled 2023-07-28 (×2): qty 250

## 2023-07-28 MED ORDER — HEPARIN BOLUS VIA INFUSION
4000.0000 [IU] | Freq: Once | INTRAVENOUS | Status: AC
  Administered 2023-07-28: 4000 [IU] via INTRAVENOUS

## 2023-07-28 MED ORDER — HEPARIN BOLUS VIA INFUSION: 4000.0000 [IU] | Freq: Once | INTRAVENOUS | Status: DC

## 2023-07-28 MED ORDER — HEPARIN (PORCINE) 25000 UT/250ML-% IV SOLN: 14.0000 [IU]/kg/h | INTRAVENOUS | Status: DC

## 2023-07-28 MED ORDER — IOHEXOL 350 MG/ML SOLN
75.0000 mL | Freq: Once | INTRAVENOUS | Status: AC | PRN
  Administered 2023-07-28: 75 mL via INTRAVENOUS

## 2023-07-28 MED ORDER — ACETAMINOPHEN 500 MG PO TABS
1000.0000 mg | ORAL_TABLET | Freq: Once | ORAL | Status: AC
  Administered 2023-07-29: 1000 mg via ORAL
  Filled 2023-07-28: qty 2

## 2023-07-28 NOTE — ED Triage Notes (Signed)
 Pt POV, slow gait- reports PCP notified her of + ddimer.  C/o ShOB x1 week.  Reports fever beginning of this week through yesterday. Also with BLLE pitting edema +2.  Also used MDI earlier this week.   labored breathing at time of triage.

## 2023-07-28 NOTE — Progress Notes (Signed)
 ANTICOAGULATION CONSULT NOTE - Initial Consult  Pharmacy Consult for Heparin  Indication: pulmonary embolus  Allergies  Allergen Reactions   Latuda  [Lurasidone  Hcl] Other (See Comments)    PER THE PT CAUSED RESTLESSNESS   Beclomethasone Dipropionate Hives and Other (See Comments)     weight gain   Flexeril  [Cyclobenzaprine ] Anxiety   Mometasone  Furo-Formoterol Fum Hives and Other (See Comments)    weight gain   Sulfonamide Derivatives Hives and Rash   Statins Other (See Comments)    Myalgias, RLS    Patient Measurements: Height: 5' 1 (154.9 cm) Weight: 76.2 kg (168 lb) IBW/kg (Calculated) : 47.8 Heparin  Dosing Weight: 64.7 kg   Vital Signs: Temp: 98.8 F (37.1 C) (02/07 2020) Temp Source: Oral (02/07 2020) BP: 126/75 (02/07 2020) Pulse Rate: 102 (02/07 2020)  Labs: Recent Labs    07/28/23 2112  HGB 9.4*  HCT 30.5*  PLT 330  CREATININE 0.67    Estimated Creatinine Clearance: 64.6 mL/min (by C-G formula based on SCr of 0.67 mg/dL).   Medical History: Past Medical History:  Diagnosis Date   Allergic bronchopulmonary aspergillosis (HCC) 2008   sees Dr Katheren Finn pulmonology   Anemia    iron  deficiency, resolved   Anxiety    Asthma    Breast cancer (HCC)    CAD (coronary artery disease)    a. LHC 6/16:  oOM1 60, pRCA 25 >> med Rx b. cath 3/19 2nd OM with 95% stenosis s/p synergy DES & anomalous RCA   CAP (community acquired pneumonia) 2016; 06/07/2016   Chronic bronchitis (HCC)    Chronic lower back pain    Complication of anesthesia    think I have a hard time waking up from it   Depression    mild   Diverticulitis    Diverticulosis    GERD (gastroesophageal reflux disease)    H/O hiatal hernia    Headache    weekly (08/23/2017)   History of echocardiogram    Echo 6/16:  Mod LVH, EF 60-65%, no RWMA, Gr 1 DD, trivial MR, normal LA size.   History of radiation therapy    Left breast- 12/26/22-01/23/23-Dr. Lynwood Nasuti   Hyperglycemia  11/20/2015   Hyperlipidemia, mixed 09/11/2007   Qualifier: Diagnosis of  By: Antonio ROSALEA Rockers   Did not tolerate Lipitor, zocor, Lovastatin, Pravastatin, Livalo , Crestor  even low dose    IBS (irritable bowel syndrome)    Maxillary sinusitis    Normal cardiac stress test 11/2011   No evidence of ischemia or infarct.   Calculated ejection fraction 72%.   Obesity    OSA (obstructive sleep apnea) 02/2012   has stopped using  cpap   Osteoarthritis    Osteoporosis    Personal history of radiation therapy    Pneumonia 11/2011   before 2013 I hadn't had pneumonia since I was a child (04/13/2012)   Pulmonary nodules    S/P angioplasty with stent 08/23/17 ostial 2nd OM with DES synnergy 08/24/2017   Schatzki's ring     Medications:  (Not in a hospital admission)  Scheduled:   [START ON 11/06/2023] denosumab   60 mg Subcutaneous Once   heparin   4,000 Units Intravenous Once   risperiDONE   1 mg Oral Once   Infusions:   heparin      PRN:   Assessment: 67 yof presenting with SOB x 1 week. Patient is followed by pulmonology for bronchopulmonary aspergillosis (2008). History of OSA, CAD, HLD, CAP. Heparin  per pharmacy consult placed for pulmonary embolus.  CTA  PE w/ bilateral pulmonary emboli w/out evidence of RHS  Patient is not on anticoagulation prior to arrival.  Hgb 9.4; plt 330  Goal of Therapy:  Heparin  level 0.3-0.7 units/ml Monitor platelets by anticoagulation protocol: Yes   Plan:  Give IV heparin  4000 units bolus x 1 Start heparin  infusion at 1150 units/hr Check anti-Xa level in 8 hours and daily while on heparin  Continue to monitor H&H and platelets  Dorn Buttner, PharmD, BCPS 07/28/2023 11:16 PM ED Clinical Pharmacist -  [phone]

## 2023-07-28 NOTE — ED Provider Notes (Addendum)
 [facility] Provider Note   CSN: [id number] Arrival date & time: 07/28/23  2012     History  Chief Complaint  Patient presents with   Shortness of Breath   Abnormal Lab    Victoria [name] is a 68 y.o. female.  Patient with 1 week history of shortness of breath.  Saw pulmonary medicine on February 4.  Some of the labs done at that time was D-dimer that came back at 6.16.  But she was not contacted by them until this evening at 1900.  And was sent in to rule out PE.  Patient's had a little bit of bilateral leg swelling now for a while.  Not 1 side more than the other.  Patient is followed by pulmonary medicine for bronchopulmonary aspergillosis in 2008 obstructive sleep apnea 2013 coronary artery disease hyperlipidemia community-acquired pneumonia most recently in 2017 status post angioplasty with stents history of radiation therapy left breast.  Patient known to have a hiatal hernia as well.  Patient's had her appendix removed.  And a ventral hernia repair.  Patient said initially she had a fever with this shortness of breath started.  But that is now resolved.  She was not tested for respiratory syncytial virus flu or COVID.  But fever has resolved.  Patient feels short of breath at rest.  This was not exertional.       Home Medications Prior to Admission medications   Medication Sig Start Date End Date Taking? Authorizing Provider  acetaminophen  (TYLENOL ) 650 MG CR tablet Take 650 mg by mouth every 8 (eight) hours as needed for pain.    [provider]  albuterol  (VENTOLIN  HFA) 108 (90 Base) MCG/ACT inhaler Inhale 1-2 puffs into the lungs every 6 (six) hours as needed. 03/07/22   Geronimo Amel, MD  ascorbic acid (VITAMIN C) 500 MG tablet Take 500 mg by mouth daily.    [provider]  aspirin  EC 81 MG tablet Take 81 mg by mouth daily. Swallow whole.    [provider]  Benralizumab  (FASENRA  PEN) 30 MG/ML SOAJ  Inject 1 mL (30 mg total) into the skin every 8 (eight) weeks. 07/26/21   Geronimo Amel, MD  budesonide  (ENTOCORT EC ) 3 MG 24 hr capsule TAKE 1 CAPSULE (3 MG TOTAL) BY MOUTH DAILY. 04/06/23   Pyrtle, Gordy HERO, MD  Calcium -Magnesium -Vitamin D  (CALCIUM  MAGNESIUM  PO) Take 1 tablet by mouth at bedtime.    [provider]  Cholecalciferol  (VITAMIN D3 ULTRA STRENGTH) 125 MCG (5000 UT) capsule Take 5,000 Units by mouth daily.    [provider]  clonazePAM  (KLONOPIN ) 0.5 MG tablet Take 1 tablet (0.5 mg total) by mouth daily. 07/19/23   Carvin Arvella HERO, MD  denosumab  (PROLIA ) 60 MG/ML SOSY injection Inject 60 mg into the skin every 6 (six) months.    [provider]  doxepin  (SINEQUAN ) 25 MG capsule Take 1 capsule (25 mg total) by mouth at bedtime. 06/30/23   Carvin Arvella HERO, MD  Evolocumab  (REPATHA  SURECLICK) 140 MG/ML SOAJ Inject 140 mg into the skin every 14 (fourteen) days. 01/31/23   Lelon Hamilton T, PA-C  famotidine  (PEPCID ) 20 MG tablet TAKE 1 TABLET BY MOUTH TWICE A DAY 10/17/22   Pyrtle, Gordy HERO, MD  ibuprofen  (ADVIL ) 200 MG tablet Take 400 mg by mouth every 6 (six) hours as needed for moderate pain.    [provider]  ipratropium-albuterol  (DUONEB) 0.5-2.5 (3) MG/3ML SOLN Take 3 mLs by nebulization  every 6 (six) hours as needed. 03/07/22   Geronimo Amel, MD  metFORMIN  (GLUCOPHAGE ) 500 MG tablet Take 1 tablet (500 mg total) by mouth 2 (two) times daily with a meal. 05/23/23   Carvin Arvella HERO, MD  nitroGLYCERIN  (NITROSTAT ) 0.4 MG SL tablet Place 1 tablet (0.4 mg total) under the tongue every 5 (five) minutes as needed for chest pain. 09/20/22   Lelon Hamilton T, PA-C  pindolol  (VISKEN ) 5 MG tablet Take 1 tablet (5 mg total) by mouth daily after breakfast. 06/30/23   Carvin Arvella HERO, MD  risperiDONE  (RISPERDAL ) 0.5 MG tablet Take 1 tablet (0.5 mg total) by mouth daily. Take 1 mg Risperdal  tab at bedtime 05/23/23   Carvin Arvella HERO, MD  risperiDONE  (RISPERDAL ) 1 MG tablet Take  1 tablet (1 mg total) by mouth at bedtime. 06/30/23   Carvin Arvella HERO, MD  tamoxifen  (NOLVADEX ) 20 MG tablet TAKE 1 TABLET (20 MG TOTAL) BY MOUTH DAILY. 06/23/23   Lanny Callander, MD  venlafaxine  XR (EFFEXOR  XR) 75 MG 24 hr capsule Take 3 capsules (225 mg total) by mouth daily with breakfast. 05/23/23   Carvin Arvella HERO, MD      Allergies    Latuda  [lurasidone  hcl], Beclomethasone dipropionate, Flexeril  [cyclobenzaprine ], Mometasone  furo-formoterol fum, Sulfonamide derivatives, and Statins    Review of Systems   Review of Systems  Constitutional:  Positive for fever. Negative for chills.  HENT:  Negative for ear pain and sore throat.   Eyes:  Negative for pain and visual disturbance.  Respiratory:  Positive for shortness of breath. Negative for cough.   Cardiovascular:  Negative for chest pain and palpitations.  Gastrointestinal:  Negative for abdominal pain and vomiting.  Genitourinary:  Negative for dysuria and hematuria.  Musculoskeletal:  Negative for arthralgias and back pain.  Skin:  Negative for color change and rash.  Neurological:  Negative for seizures and syncope.  All other systems reviewed and are negative.   Physical Exam Updated Vital Signs BP 126/75   Pulse (!) 102   Temp 98.8 F (37.1 C) (Oral)   Resp (!) 24   Ht 1.549 m (5' 1)   Wt 76.2 kg   SpO2 100%   BMI 31.74 kg/m  Physical Exam Vitals and nursing note reviewed.  Constitutional:      General: She is not in acute distress.    Appearance: Normal appearance. She is well-developed. She is not ill-appearing.  HENT:     Head: Normocephalic and atraumatic.     Mouth/Throat:     Mouth: Mucous membranes are moist.  Eyes:     Conjunctiva/sclera: Conjunctivae normal.     Pupils: Pupils are equal, round, and reactive to light.  Cardiovascular:     Rate and Rhythm: Normal rate and regular rhythm.     Heart sounds: No murmur heard. Pulmonary:     Effort: Pulmonary effort is normal. No respiratory distress.      Breath sounds: Normal breath sounds. No stridor. No wheezing, rhonchi or rales.  Chest:     Chest wall: No tenderness.  Abdominal:     Palpations: Abdomen is soft.     Tenderness: There is no abdominal tenderness.  Musculoskeletal:        General: No swelling.     Cervical back: Normal range of motion and neck supple.     Right lower leg: Edema present.     Left lower leg: Edema present.     Comments: Mild and equal bilateral pulmonary edema.  Skin:    General: Skin is warm and dry.     Capillary Refill: Capillary refill takes less than 2 seconds.  Neurological:     General: No focal deficit present.     Mental Status: She is alert and oriented to person, place, and time.  Psychiatric:        Mood and Affect: Mood normal.     ED Results / Procedures / Treatments   Labs (all labs ordered are listed, but only abnormal results are displayed) Labs Reviewed  BASIC METABOLIC PANEL - Abnormal; Notable for the following components:      Result Value   Glucose, Bld 100 (*)    Calcium  8.8 (*)    All other components within normal limits  CBC - Abnormal; Notable for the following components:   RBC 3.51 (*)    Hemoglobin 9.4 (*)    HCT 30.5 (*)    RDW 18.1 (*)    All other components within normal limits  BRAIN NATRIURETIC PEPTIDE    EKG EKG Interpretation Date/Time:  Friday July 28 2023 20:29:32 EST Ventricular Rate:  97 PR Interval:  165 QRS Duration:  93 QT Interval:  364 QTC Calculation: 463 R Axis:   37  Text Interpretation: Sinus rhythm Low voltage, precordial leads Confirmed by Annlouise Gerety [phone]) on 07/28/2023 8:51:54 PM  Radiology CT Angio Chest PE W/Cm &/Or Wo Cm Addendum Date: 07/28/2023 ADDENDUM REPORT: 07/28/2023 22:56 ADDENDUM: Critical Value/emergent results were called by telephone at the time of interpretation on 07/28/2023 at 10:56 pm to provider Mase Dhondt , who verbally acknowledged these results. Electronically Signed   By: Leita Birmingham M.D.    On: 07/28/2023 22:56   Result Date: 07/28/2023 CLINICAL DATA:  Pulmonary embolism suspected, high probability. Borderline D-dimer. Shortness of breath with fever. EXAM: CT ANGIOGRAPHY CHEST WITH CONTRAST TECHNIQUE: Multidetector CT imaging of the chest was performed using the standard protocol during bolus administration of intravenous contrast. Multiplanar CT image reconstructions and MIPs were obtained to evaluate the vascular anatomy. RADIATION DOSE REDUCTION: This exam was performed according to the departmental dose-optimization program which includes automated exposure control, adjustment of the mA and/or kV according to patient size and/or use of iterative reconstruction technique. CONTRAST:  75mL OMNIPAQUE  IOHEXOL  350 MG/ML SOLN COMPARISON:  04/08/2021. FINDINGS: Cardiovascular: The heart is mildly enlarged and there is no pericardial effusion. Multi-vessel coronary artery calcifications are noted. There is atherosclerotic calcification of the aorta without evidence of aneurysm. The pulmonary trunk is normal in caliber. Pulmonary emboli are identified in the right pulmonary artery extending into the lobar, segmental and subsegmental arteries in the left upper and lower lobes. Pulmonary emboli are noted in the lobar, segmental, and subsegmental arteries in the right lower and middle lobes. There is no evidence of right heart strain. Mediastinum/Nodes: No mediastinal, hilar, axillary lymphadenopathy. The trachea and esophagus are within normal limits. There is a large hiatal hernia. Lungs/Pleura: Pleural and parenchymal scarring is noted at the lung apices bilaterally. Strandy atelectasis, scarring, or infiltrates are present in the left upper lobe. There is atelectasis in the left lower lobe. Bronchiectasis with associated atelectasis or scarring is noted in the left upper lobe. Multiple pulmonary nodules are noted on the right measuring up to 6 mm in the right lower lobe, axial image 76 and unchanged from  2017 and likely benign. No effusion or pneumothorax. Upper Abdomen: Scattered diverticula are noted along the colon. No acute abnormality. Musculoskeletal: Surgical changes are noted in the left breast. Degenerative  changes are present in the thoracic spine. No acute osseous abnormality Review of the MIP images confirms the above findings. IMPRESSION: 1. Bilateral pulmonary emboli. No evidence of right heart strain is seen. 2. Atelectasis, infiltrate, or scarring in the left upper lobe, new from the previous exam. 3. Large hiatal hernia. 4. Coronary artery calcifications. 5. Aortic atherosclerosis. Electronically Signed: By: Leita Birmingham M.D. On: 07/28/2023 22:39   DG Chest 2 View Result Date: 07/28/2023 CLINICAL DATA:  Shortness of breath. EXAM: CHEST - 2 VIEW COMPARISON:  March 17, 2021 FINDINGS: The heart size and mediastinal contours are within normal limits. Mild bilateral infrahilar atelectatic changes are seen. No pleural effusion or pneumothorax is identified. A predominantly stable moderate to large hiatal hernia is noted. Multilevel degenerative changes are seen throughout the thoracic spine. IMPRESSION: 1. Mild bilateral infrahilar atelectatic changes. 2. Moderate to large hiatal hernia. Electronically Signed   By: Suzen Dials M.D.   On: 07/28/2023 21:25    Procedures Procedures    Medications Ordered in ED Medications  iohexol  (OMNIPAQUE ) 350 MG/ML injection 75 mL (75 mLs Intravenous Contrast Given 07/28/23 2221)    ED Course/ Medical Decision Making/ A&P                                 Medical Decision Making Amount and/or Complexity of Data Reviewed Labs: ordered. Radiology: ordered.  Risk Prescription drug management. Decision regarding hospitalization.   Patient seen by pulmonary medicine to rule out pulmonary embolus.  Patient's D-dimer actually at 6.16 is a little borderline for age adjustment.  Chest x-ray however does not show anything other than the hiatal  hernia.  Lungs are clear bilaterally oxygen  sats are excellent at 100%.  Heart rate was up a little bit at 102.  Will go ahead and do the CT angio to rule out PE.  Patient metabolic panel renal function is normal with GFR greater than 60.  Electrolytes normal.  CBC white count 5.2 hemoglobin 9.4 platelets 330.  BMP still in process.  CT angio consistent with bilateral pulmonary emboli no evidence of right heart strain.  Evidence of pulmonary emboli identified in the right pulmonary artery extending into the lobar segmental and subsegmental arteries and in the left upper and lower lobe pulmonary relay are not noted in the lobar segmental and subsegmental arteries in the right lower and middle lobes there is no evidence of right heart strain.  Patient a little bit tachycardic no hypoxia no hypotension.  Will start heparin  and will contact hospitalist for admission.   Final Clinical Impression(s) / ED Diagnoses Final diagnoses:  Shortness of breath  Other acute pulmonary embolism without acute cor pulmonale [facility])    Rx / DC Orders ED Discharge Orders     None         Geraldene Hamilton, MD 07/28/23 7855    Geraldene Hamilton, MD 07/28/23 7855    Geraldene Hamilton, MD 07/28/23 2302

## 2023-07-28 NOTE — Progress Notes (Signed)
 Plan of Care Note for accepted transfer   Patient: Victoria [name] MRN: [id number]   DOA: 07/28/2023  Facility requesting transfer: [facility]   Requesting Provider: Dr. Zackowski   Reason for transfer: PE  Facility course: 68 yr old lady with history of asthma, OSA, allergic bronchopulmonary aspergillosis, CAD, depression, and anxiety who presents for evaluation of worsening shortness of breath and elevated D-dimer.  Patient recently saw our her pulmonologist, was complaining of progressive exertional dyspnea, and had labs performed including D-dimer which is elevated at 6.16.   CTA reveals bilateral pulmonary embolism without evidence for right heart strain.  She was slightly tachycardic initially in the ED but heart rate has since normalized.  Blood pressure has been stable and she is saturating well on room air.  BNP is also normal.   She was started on IV heparin  infusion in the ED.  Plan of care: The patient is accepted for admission to Telemetry unit, at [facility].   Author: Evalene GORMAN Sprinkles, MD 07/28/2023  Check [email or web address] for on-call coverage.  Nursing staff, Please call [facility] Admits & Consults System-Wide number on Amion as soon as patient's arrival, so appropriate admitting provider can evaluate the pt.

## 2023-07-28 NOTE — Telephone Encounter (Addendum)
 Patient saw Dr. Annella 07/25/23 and did the  D-dimer I had and advised if prior week.  it is elevated she would need to get the CT angiogram to rule out pulmonary embolism.  I was off and returned back to work today 07/28/2023 and just picked up the message from her. x.  The significant elevation in the D-dimer 6.  I called the patient on [phone] but it went to voicemail.  I then called the husband on [phone] and it went to voicemail.  I did tell the husband on the voicemail about the elevated D-dimer.  I then called the patient again on [phone] -> 7:07 PM   Plan  - go to ER 07/28/2023 -> rule out PE - she asked if she can go in the morning because DOE is better ) but because d-dimer is high advised to go to closest ER    SIGNATURE    Dr. Dorethia Cave, M.D., F.C.C.P,  Pulmonary and Critical Care Medicine Staff Physician, [facility] Director - Interstitial Lung Disease  Program  Pulmonary Fibrosis [facility] at [facility] [place], [place], [address]   Pager: [phone], If no answer  -> Check AMION or Try [phone] Telephone (clinical office): [phone] Telephone (research): [phone]  7:05 PM 07/28/2023      Latest Reference Range & Units 12/13/11 17:30 05/27/14 15:26 11/09/14 21:00 04/01/20 12:31 07/25/23 10:50  D-Dimer, Quant <0.50 mcg/mL FEU 1.54 (H) 0.54 (H) 2.51 (H) 1.08 (H) 6.16 (H)  (H): Data is abnormally high   Scheduled Meds:  [START ON 11/06/2023] denosumab   60 mg Subcutaneous Once   Continuous Infusions: PRN Meds:.  Current Outpatient Medications:    acetaminophen  (TYLENOL ) 650 MG CR tablet, Take 650 mg by mouth every 8 (eight) hours as needed for pain., Disp: , Rfl:    albuterol  (VENTOLIN  HFA) 108 (90 Base) MCG/ACT inhaler, Inhale 1-2 puffs into the lungs every 6 (six) hours as needed., Disp: 8 g, Rfl: 2   ascorbic acid (VITAMIN C) 500 MG tablet, Take 500 mg by mouth daily., Disp: , Rfl:    aspirin  EC 81  MG tablet, Take 81 mg by mouth daily. Swallow whole., Disp: , Rfl:    Benralizumab  (FASENRA  PEN) 30 MG/ML SOAJ, Inject 1 mL (30 mg total) into the skin every 8 (eight) weeks., Disp: 1 mL, Rfl: 2   budesonide  (ENTOCORT EC ) 3 MG 24 hr capsule, TAKE 1 CAPSULE (3 MG TOTAL) BY MOUTH DAILY., Disp: 30 capsule, Rfl: 1   Calcium -Magnesium -Vitamin D  (CALCIUM  MAGNESIUM  PO), Take 1 tablet by mouth at bedtime., Disp: , Rfl:    Cholecalciferol  (VITAMIN D3 ULTRA STRENGTH) 125 MCG (5000 UT) capsule, Take 5,000 Units by mouth daily., Disp: , Rfl:    clonazePAM  (KLONOPIN ) 0.5 MG tablet, Take 1 tablet (0.5 mg total) by mouth daily., Disp: 30 tablet, Rfl: 1   denosumab  (PROLIA ) 60 MG/ML SOSY injection, Inject 60 mg into the skin every 6 (six) months., Disp: , Rfl:    doxepin  (SINEQUAN ) 25 MG capsule, Take 1 capsule (25 mg total) by mouth at bedtime., Disp: 30 capsule, Rfl: 3   Evolocumab  (REPATHA  SURECLICK) 140 MG/ML SOAJ, Inject 140 mg into the skin every 14 (fourteen) days., Disp: 2 mL, Rfl: 11   famotidine  (PEPCID ) 20 MG tablet, TAKE 1 TABLET BY MOUTH TWICE A DAY, Disp: 60 tablet, Rfl: 11   ibuprofen  (ADVIL ) 200 MG tablet, Take 400 mg by mouth every 6 (six)  hours as needed for moderate pain., Disp: , Rfl:    ipratropium-albuterol  (DUONEB) 0.5-2.5 (3) MG/3ML SOLN, Take 3 mLs by nebulization every 6 (six) hours as needed., Disp: 120 mL, Rfl: 5   metFORMIN  (GLUCOPHAGE ) 500 MG tablet, Take 1 tablet (500 mg total) by mouth 2 (two) times daily with a meal., Disp: 60 tablet, Rfl: 2   nitroGLYCERIN  (NITROSTAT ) 0.4 MG SL tablet, Place 1 tablet (0.4 mg total) under the tongue every 5 (five) minutes as needed for chest pain., Disp: 25 tablet, Rfl: 11   pindolol  (VISKEN ) 5 MG tablet, Take 1 tablet (5 mg total) by mouth daily after breakfast., Disp: 30 tablet, Rfl: 3   risperiDONE  (RISPERDAL ) 0.5 MG tablet, Take 1 tablet (0.5 mg total) by mouth daily. Take 1 mg Risperdal  tab at bedtime, Disp: 60 tablet, Rfl: 2   risperiDONE   (RISPERDAL ) 1 MG tablet, Take 1 tablet (1 mg total) by mouth at bedtime., Disp: 30 tablet, Rfl: 2   tamoxifen  (NOLVADEX ) 20 MG tablet, TAKE 1 TABLET (20 MG TOTAL) BY MOUTH DAILY., Disp: 30 tablet, Rfl: 3   venlafaxine  XR (EFFEXOR  XR) 75 MG 24 hr capsule, Take 3 capsules (225 mg total) by mouth daily with breakfast., Disp: 90 capsule, Rfl: 1  Current Facility-Administered Medications:    [START ON 11/06/2023] denosumab  (PROLIA ) injection 60 mg, 60 mg, Subcutaneous, Once, Domenica Harlene LABOR, MD

## 2023-07-29 ENCOUNTER — Inpatient Hospital Stay (HOSPITAL_COMMUNITY): Payer: PPO

## 2023-07-29 DIAGNOSIS — Z86711 Personal history of pulmonary embolism: Secondary | ICD-10-CM

## 2023-07-29 DIAGNOSIS — D509 Iron deficiency anemia, unspecified: Secondary | ICD-10-CM | POA: Diagnosis not present

## 2023-07-29 DIAGNOSIS — E669 Obesity, unspecified: Secondary | ICD-10-CM | POA: Diagnosis not present

## 2023-07-29 DIAGNOSIS — Z955 Presence of coronary angioplasty implant and graft: Secondary | ICD-10-CM | POA: Diagnosis not present

## 2023-07-29 DIAGNOSIS — E119 Type 2 diabetes mellitus without complications: Secondary | ICD-10-CM | POA: Diagnosis not present

## 2023-07-29 DIAGNOSIS — R0602 Shortness of breath: Secondary | ICD-10-CM | POA: Diagnosis present

## 2023-07-29 DIAGNOSIS — Z79899 Other long term (current) drug therapy: Secondary | ICD-10-CM | POA: Diagnosis not present

## 2023-07-29 DIAGNOSIS — C50919 Malignant neoplasm of unspecified site of unspecified female breast: Secondary | ICD-10-CM | POA: Diagnosis not present

## 2023-07-29 DIAGNOSIS — J4489 Other specified chronic obstructive pulmonary disease: Secondary | ICD-10-CM | POA: Diagnosis not present

## 2023-07-29 DIAGNOSIS — I2699 Other pulmonary embolism without acute cor pulmonale: Secondary | ICD-10-CM | POA: Diagnosis not present

## 2023-07-29 DIAGNOSIS — I2510 Atherosclerotic heart disease of native coronary artery without angina pectoris: Secondary | ICD-10-CM | POA: Diagnosis not present

## 2023-07-29 DIAGNOSIS — Z7982 Long term (current) use of aspirin: Secondary | ICD-10-CM | POA: Diagnosis not present

## 2023-07-29 DIAGNOSIS — E249 Cushing's syndrome, unspecified: Secondary | ICD-10-CM | POA: Diagnosis not present

## 2023-07-29 DIAGNOSIS — I5032 Chronic diastolic (congestive) heart failure: Secondary | ICD-10-CM | POA: Diagnosis not present

## 2023-07-29 DIAGNOSIS — Z7984 Long term (current) use of oral hypoglycemic drugs: Secondary | ICD-10-CM | POA: Diagnosis not present

## 2023-07-29 DIAGNOSIS — E782 Mixed hyperlipidemia: Secondary | ICD-10-CM | POA: Diagnosis not present

## 2023-07-29 DIAGNOSIS — Z888 Allergy status to other drugs, medicaments and biological substances status: Secondary | ICD-10-CM | POA: Diagnosis not present

## 2023-07-29 DIAGNOSIS — I2602 Saddle embolus of pulmonary artery with acute cor pulmonale: Secondary | ICD-10-CM | POA: Diagnosis not present

## 2023-07-29 DIAGNOSIS — F329 Major depressive disorder, single episode, unspecified: Secondary | ICD-10-CM | POA: Diagnosis not present

## 2023-07-29 DIAGNOSIS — J8283 Eosinophilic asthma: Secondary | ICD-10-CM | POA: Diagnosis not present

## 2023-07-29 DIAGNOSIS — K219 Gastro-esophageal reflux disease without esophagitis: Secondary | ICD-10-CM | POA: Diagnosis not present

## 2023-07-29 DIAGNOSIS — Z882 Allergy status to sulfonamides status: Secondary | ICD-10-CM | POA: Diagnosis not present

## 2023-07-29 DIAGNOSIS — B441 Other pulmonary aspergillosis: Secondary | ICD-10-CM | POA: Diagnosis not present

## 2023-07-29 DIAGNOSIS — Z79810 Long term (current) use of selective estrogen receptor modulators (SERMs): Secondary | ICD-10-CM | POA: Diagnosis not present

## 2023-07-29 DIAGNOSIS — Z923 Personal history of irradiation: Secondary | ICD-10-CM | POA: Diagnosis not present

## 2023-07-29 DIAGNOSIS — Z833 Family history of diabetes mellitus: Secondary | ICD-10-CM | POA: Diagnosis not present

## 2023-07-29 DIAGNOSIS — Z8249 Family history of ischemic heart disease and other diseases of the circulatory system: Secondary | ICD-10-CM | POA: Diagnosis not present

## 2023-07-29 LAB — HEPARIN LEVEL (UNFRACTIONATED): Heparin Unfractionated: 0.66 [IU]/mL (ref 0.30–0.70)

## 2023-07-29 MED ORDER — VENLAFAXINE HCL ER 75 MG PO CP24
225.0000 mg | ORAL_CAPSULE | Freq: Every day | ORAL | Status: DC
  Administered 2023-07-29 – 2023-07-30 (×2): 225 mg via ORAL
  Filled 2023-07-29 (×2): qty 3

## 2023-07-29 MED ORDER — RISPERIDONE 1 MG PO TABS
1.0000 mg | ORAL_TABLET | Freq: Every day | ORAL | Status: DC
  Administered 2023-07-29: 1 mg via ORAL
  Filled 2023-07-29: qty 1

## 2023-07-29 MED ORDER — VENLAFAXINE HCL ER 75 MG PO CP24
225.0000 mg | ORAL_CAPSULE | Freq: Every day | ORAL | Status: DC
Start: 2023-07-30 — End: 2023-07-29

## 2023-07-29 MED ORDER — DOXEPIN HCL 25 MG PO CAPS
25.0000 mg | ORAL_CAPSULE | Freq: Every day | ORAL | Status: DC
  Administered 2023-07-29: 25 mg via ORAL
  Filled 2023-07-29 (×3): qty 1

## 2023-07-29 MED ORDER — RISPERIDONE 0.5 MG PO TABS
0.5000 mg | ORAL_TABLET | Freq: Every day | ORAL | Status: DC
  Administered 2023-07-30: 0.5 mg via ORAL
  Filled 2023-07-29: qty 1

## 2023-07-29 MED ORDER — CLONAZEPAM 0.5 MG PO TABS
0.5000 mg | ORAL_TABLET | Freq: Every morning | ORAL | Status: DC
  Administered 2023-07-29 – 2023-07-30 (×2): 0.5 mg via ORAL
  Filled 2023-07-29 (×2): qty 1

## 2023-07-29 MED ORDER — PINDOLOL 5 MG PO TABS
5.0000 mg | ORAL_TABLET | Freq: Every day | ORAL | Status: DC
  Administered 2023-07-30: 5 mg via ORAL
  Filled 2023-07-29: qty 1

## 2023-07-29 MED ORDER — LORAZEPAM 2 MG/ML IJ SOLN
0.5000 mg | Freq: Once | INTRAMUSCULAR | Status: AC
  Administered 2023-07-29: 0.5 mg via INTRAVENOUS
  Filled 2023-07-29: qty 1

## 2023-07-29 MED ORDER — ACETAMINOPHEN 325 MG PO TABS: 650.0000 mg | ORAL_TABLET | Freq: Four times a day (QID) | ORAL | Status: DC | PRN

## 2023-07-29 MED ORDER — ALBUTEROL SULFATE (2.5 MG/3ML) 0.083% IN NEBU: 2.5000 mg | INHALATION_SOLUTION | RESPIRATORY_TRACT | Status: DC | PRN

## 2023-07-29 NOTE — H&P (Signed)
 History and Physical    Patient: Victoria [name] FMW:[id number] DOB: 1956-03-20 DOA: 07/28/2023 DOS: the patient was seen and examined on 07/29/2023 PCP: Mannam, Praveen, MD  Patient coming from: Home Medical readiness/disposition: Pending additional evaluation by the oncology team anticipate patient will be ready to discharge on 07/30/2023.  We also need to obtain prior authorization for preferred anticoagulation medication before discharge.  Disposition will be return to home  Chief Complaint:  Chief Complaint  Patient presents with   Shortness of Breath   Abnormal Lab   HPI: Victoria [name] is a 68 y.o. female with medical history significant of eosinophilic asthma, HFpEF, dyslipidemia, type 2 diabetes, Cushing syndrome, GERD, osteoarthritis, history of CAD with prior PTCA and stent to the OM 2 in 2019, new diagnosis of iron  deficiency anemia, malignant neoplasm of the left breast/invasive ductal carcinoma diagnosed in May 2024 currently on tamoxifen  and follow-up with Dr. Lanny.  Patient reports that for the past 2 to 3 weeks she has had increasing shortness of breath and dyspnea on exertion not typical for her asthma symptoms.  About a week ago she developed bilateral lower extremity edema.  During the same timeframe she developed fevers up to 102 F without any other constitutional symptoms such as cough, runny nose, nausea vomiting or diarrhea.  Interestingly enough after her fevers broke her respiratory symptoms markedly improved.  She followed up with her pulmonologist who checked a D-dimer and this was elevated at greater than 6.  She was sent to one of our freestanding ED's and underwent a CTA of the chest which revealed B PE without any evidence of right heart strain.  Hospitalist service was consulted for admission.  Initial lab work otherwise was unremarkable.  She has remained afebrile, white count is normal.  She is stable on room air with O2 sats between 97 and 100%.  She is normotensive and  nontachycardic.  She had recently started on Ozempic for diabetes and her metformin  was discontinued.  Review of systems was otherwise unremarkable.  She did report that her brother died suddenly of a PE at age 40.  She clarified that he had been hospitalized in the ICU and apparently had some sort of thrombotic event.  He was discharged on anticoagulation.  Due to inability to afford housing in the [place] area he was living out of his truck and was taking his anticoagulation if he felt symptomatic.  Review of Systems: As mentioned in the history of present illness. All other systems reviewed and are negative.  Past Medical History:  Diagnosis Date   Allergic bronchopulmonary aspergillosis (HCC) 2008   sees Dr Katheren Finn pulmonology   Anemia    iron  deficiency, resolved   Anxiety    Asthma    Breast cancer (HCC)    CAD (coronary artery disease)    a. LHC 6/16:  oOM1 60, pRCA 25 >> med Rx b. cath 3/19 2nd OM with 95% stenosis s/p synergy DES & anomalous RCA   CAP (community acquired pneumonia) 2016; 06/07/2016   Chronic bronchitis (HCC)    Chronic lower back pain    Complication of anesthesia    think I have a hard time waking up from it   Depression    mild   Diverticulitis    Diverticulosis    GERD (gastroesophageal reflux disease)    H/O hiatal hernia    Headache    weekly (08/23/2017)   History of echocardiogram    Echo 6/16:  Mod LVH, EF  60-65%, no RWMA, Gr 1 DD, trivial MR, normal LA size.   History of radiation therapy    Left breast- 12/26/22-01/23/23-Dr. Lynwood Nasuti   Hyperglycemia 11/20/2015   Hyperlipidemia, mixed 09/11/2007   Qualifier: Diagnosis of  By: Antonio ROSALEA Rockers   Did not tolerate Lipitor, zocor, Lovastatin, Pravastatin, Livalo , Crestor  even low dose    IBS (irritable bowel syndrome)    Maxillary sinusitis    Normal cardiac stress test 11/2011   No evidence of ischemia or infarct.   Calculated ejection fraction 72%.   Obesity    OSA (obstructive  sleep apnea) 02/2012   has stopped using  cpap   Osteoarthritis    Osteoporosis    Personal history of radiation therapy    Pneumonia 11/2011   before 2013 I hadn't had pneumonia since I was a child (04/13/2012)   Pulmonary nodules    S/P angioplasty with stent 08/23/17 ostial 2nd OM with DES synnergy 08/24/2017   Schatzki's ring    Past Surgical History:  Procedure Laterality Date   ANTERIOR AND POSTERIOR REPAIR N/A 10/18/2022   Procedure: ANTERIOR (CYSTOCELE);  Surgeon: Mat Browning, MD;  Location: [facility] OR;  Service: Gynecology;  Laterality: N/A;   APPENDECTOMY  1989   BREAST BIOPSY  11/03/2022   MM LT RADIOACTIVE SEED LOC MAMMO GUIDE 11/03/2022 [facility] MAMMOGRAPHY   BREAST LUMPECTOMY WITH RADIOACTIVE SEED LOCALIZATION Left 11/04/2022   Procedure: LEFT BREAST LUMPECTOMY WITH RADIOACTIVE SEED LOCALIZATION;  Surgeon: Curvin Deward MOULD, MD;  Location: [facility];  Service: General;  Laterality: Left;   CARDIAC CATHETERIZATION N/A 11/25/2014   Procedure: Right/Left Heart Cath and Coronary Angiography;  Surgeon: Victory LELON Sharps, MD;  Location: [facility] INVASIVE CV LAB;  Service: Cardiovascular;  Laterality: N/A;   CESAREAN SECTION  1985   COLONOSCOPY  03/2022   CORONARY ANGIOPLASTY WITH STENT PLACEMENT  08/23/2017   CORONARY STENT INTERVENTION N/A 08/23/2017   Procedure: CORONARY STENT INTERVENTION;  Surgeon: Verlin Lonni BIRCH, MD;  Location: [facility] INVASIVE CV LAB;  Service: Cardiovascular;  Laterality: N/A;   HERNIA REPAIR  04/13/2012   VHR laparoscopic   LAPAROSCOPIC BILATERAL SALPINGO OOPHERECTOMY Bilateral 10/18/2022   Procedure: LAPAROTOMY WITH BILATERAL SALPINGO OOPHORECTOMY;  Surgeon: Mat Browning, MD;  Location: [facility] OR;  Service: Gynecology;  Laterality: Bilateral;   LEFT HEART CATH AND CORONARY ANGIOGRAPHY N/A 08/23/2017   Procedure: LEFT HEART CATH AND CORONARY ANGIOGRAPHY;  Surgeon: Verlin Lonni BIRCH, MD;  Location: [facility] INVASIVE CV LAB;  Service: Cardiovascular;   Laterality: N/A;   VAGINAL HYSTERECTOMY N/A 10/18/2022   Procedure: HYSTERECTOMY VAGINAL;  Surgeon: Mat Browning, MD;  Location: [facility] OR;  Service: Gynecology;  Laterality: N/A;   VENTRAL HERNIA REPAIR  04/13/2012   Procedure: LAPAROSCOPIC VENTRAL HERNIA;  Surgeon: Elon CHRISTELLA Pacini, MD;  Location: [facility] OR;  Service: General;  Laterality: N/A;  laparoscopic repair of incarcerated hernia   Social History:  reports that she has never smoked. She has never been exposed to tobacco smoke. She has never used smokeless tobacco. She reports current alcohol use of about 4.0 - 7.0 standard drinks of alcohol per week. She reports that she does not use drugs.  Allergies  Allergen Reactions   Latuda  [Lurasidone  Hcl] Other (See Comments)    PER THE PT, CAUSED RESTLESSNESS   Beclomethasone Dipropionate Hives and Other (See Comments)    Weight gain, also   Flexeril  [Cyclobenzaprine ] Anxiety   Mometasone  Furo-Formoterol Fum Hives and Other (See Comments)    Weight gain, also   Sulfonamide  Derivatives Hives and Rash   Statins Other (See Comments)    Myalgias, RLS   Fluticasone  Rash and Other (See Comments)    Weight gain, too    Family History  Problem Relation Age of Onset   Breast cancer Mother 76       bilateral breast cancer dx. 7, met to liver   Hypertension Mother    Diabetes Mother    Diverticulosis Father    Prostate cancer Father    Breast cancer Sister 34       DCIS at 57, IDC at 59, PALB2+   Cancer Sister        breast cancer, invasive ductal carcinoma in 2022,DCIS at 54 with 4 weeks of radiation, 5 years of Tamoxifen     Pulmonary embolism Brother        recurrent   Heart attack Maternal Grandfather    Ovarian cancer Paternal Grandmother    Cerebral palsy Son    Prostate cancer Paternal Uncle    Breast cancer Niece 62       PALB2+   Osteoporosis Niece    Stroke Neg Hx    Colon cancer Neg Hx    Esophageal cancer Neg Hx    Stomach cancer Neg Hx    Rectal cancer Neg Hx      Prior to Admission medications   Medication Sig Start Date End Date Taking? Authorizing Provider  acetaminophen  (TYLENOL ) 650 MG CR tablet Take 650 mg by mouth every 8 (eight) hours as needed for pain.    [provider]  albuterol  (VENTOLIN  HFA) 108 (90 Base) MCG/ACT inhaler Inhale 1-2 puffs into the lungs every 6 (six) hours as needed. 03/07/22   Geronimo Amel, MD  ascorbic acid (VITAMIN C) 500 MG tablet Take 500 mg by mouth daily.    [provider]  aspirin  EC 81 MG tablet Take 81 mg by mouth daily. Swallow whole.    [provider]  Benralizumab  (FASENRA  PEN) 30 MG/ML SOAJ Inject 1 mL (30 mg total) into the skin every 8 (eight) weeks. 07/26/21   Geronimo Amel, MD  budesonide  (ENTOCORT EC ) 3 MG 24 hr capsule TAKE 1 CAPSULE (3 MG TOTAL) BY MOUTH DAILY. 04/06/23   Pyrtle, Gordy HERO, MD  Calcium -Magnesium -Vitamin D  (CALCIUM  MAGNESIUM  PO) Take 1 tablet by mouth at bedtime.    [provider]  Cholecalciferol  (VITAMIN D3 ULTRA STRENGTH) 125 MCG (5000 UT) capsule Take 5,000 Units by mouth daily.    [provider]  clonazePAM  (KLONOPIN ) 0.5 MG tablet Take 1 tablet (0.5 mg total) by mouth daily. 07/19/23   Carvin Arvella HERO, MD  denosumab  (PROLIA ) 60 MG/ML SOSY injection Inject 60 mg into the skin every 6 (six) months.    [provider]  doxepin  (SINEQUAN ) 25 MG capsule Take 1 capsule (25 mg total) by mouth at bedtime. 06/30/23   Carvin Arvella HERO, MD  Evolocumab  (REPATHA  SURECLICK) 140 MG/ML SOAJ Inject 140 mg into the skin every 14 (fourteen) days. 01/31/23   Lelon Hamilton T, PA-C  famotidine  (PEPCID ) 20 MG tablet TAKE 1 TABLET BY MOUTH TWICE A DAY 10/17/22   Pyrtle, Gordy HERO, MD  ibuprofen  (ADVIL ) 200 MG tablet Take 400 mg by mouth every 6 (six) hours as needed for moderate pain.    [provider]  ipratropium-albuterol  (DUONEB) 0.5-2.5 (3) MG/3ML SOLN Take 3 mLs by nebulization every 6 (six) hours as needed. 03/07/22   Geronimo Amel, MD   metFORMIN  (GLUCOPHAGE ) 500 MG tablet Take 1 tablet (  500 mg total) by mouth 2 (two) times daily with a meal. 05/23/23   Carvin Arvella HERO, MD  nitroGLYCERIN  (NITROSTAT ) 0.4 MG SL tablet Place 1 tablet (0.4 mg total) under the tongue every 5 (five) minutes as needed for chest pain. 09/20/22   Lelon Hamilton T, PA-C  pindolol  (VISKEN ) 5 MG tablet Take 1 tablet (5 mg total) by mouth daily after breakfast. 06/30/23   Carvin Arvella HERO, MD  risperiDONE  (RISPERDAL ) 0.5 MG tablet Take 1 tablet (0.5 mg total) by mouth daily. Take 1 mg Risperdal  tab at bedtime 05/23/23   Carvin Arvella HERO, MD  risperiDONE  (RISPERDAL ) 1 MG tablet Take 1 tablet (1 mg total) by mouth at bedtime. 06/30/23   Carvin Arvella HERO, MD  tamoxifen  (NOLVADEX ) 20 MG tablet TAKE 1 TABLET (20 MG TOTAL) BY MOUTH DAILY. 06/23/23   Lanny Callander, MD  venlafaxine  XR (EFFEXOR  XR) 75 MG 24 hr capsule Take 3 capsules (225 mg total) by mouth daily with breakfast. 05/23/23   Carvin Arvella HERO, MD    Physical Exam: Vitals:   07/29/23 1022 07/29/23 1211 07/29/23 1338 07/29/23 1340  BP:  116/62  111/71  Pulse:  87  92  Resp:  18  16  Temp: 98.7 F (37.1 C)   98.9 F (37.2 C)  TempSrc: Oral   Oral  SpO2:  99%  100%  Weight:    76.2 kg  Height:   5' 1 (1.549 m)    Constitutional: NAD, calm, comfortable Respiratory: clear to auscultation bilaterally, no wheezing, no crackles. Normal respiratory effort. No accessory muscle use.  Room air Cardiovascular: Regular rate and rhythm, no murmurs / rubs / gallops.  1+ bilateral lower extremity edema which is tight.  There are some associated mild erythema when legs are dependent.  2+ pedal pulses.  Abdomen: no tenderness, no masses palpated. No hepatosplenomegaly. Bowel sounds positive.  Musculoskeletal: no clubbing / cyanosis. No joint deformity upper and lower extremities. Good ROM, no contractures. Normal muscle tone.  Skin: no rashes, lesions, ulcers. No induration Neurologic: CN 2-12 grossly intact. Sensation intact,  Strength 5/5 x all 4 extremities.  Psychiatric: Normal judgment and insight. Alert and oriented x 3. Normal mood.    Data Reviewed:  There are no new results to review at this time.  Assessment and Plan: Bilateral PE Patient with several weeks of shortness of breath which has now improved and stable on room air and no further DOE Outpatient D-dimer greater than 6 CTA of chest demonstrates bilateral pulmonary emboli without evidence of right heart strain Check echocardiogram and lower extremity venous duplex for completeness of evaluation Patient has been started on IV heparin  Brother did have sudden death due to PE after prolonged hospitalization in ICU Patient is currently being treated for her breast cancer with tamoxifen .  This medication can also cause thrombotic issues.  I have requested oncology consultation Unclear if oncology team prefers Lovenox  for anticoagulation or we can use other agents such as Coumadin or NOAC I have asked TOC to assist with prior authorization of NOAC and/or Lovenox . O2 as needed O2 sats less than or equal to 94%  History of eosinophilic asthma Albuterol  nebulizer every 2 hours as needed  Diabetes mellitus 2 No longer on metformin  as she has subsequently been initiated on Ozempic Follow CBGs and provide SSI Hemoglobin A1c was 5.3 in November 2024  Invasive ductal carcinoma diagnosed May 2024 Hold preadmission tamoxifen  Secure chat sent to on-call oncologist Dr. Onesimo to notify of new diagnosis  of PE and to clarify most appropriate anticoagulation agent and patient with underlying malignancy; patient's primary oncologist also added into the same check  Iron  deficiency anemia At last oncology visit on 1/13 patient's hemoglobin was 8.4.  Apparently she had a history of GI bleeding in 2022 and was unable to tolerate oral iron  and a recent upper and lower endoscopy were normal Subsequently patient had been ordered 3 rounds of IV iron  but only completed 2  given her recent fever they did not wish to give the third dose Unclear if oncology would prefer patient to receive IV iron  before discharge-defer to consultant  HFpEF Clinically stable without any signs of decompensation  Lymphocytic colitis Patient has chronic diarrhea and oncologist reports as controlled  CAD with history of PTCA and stent to OM 2 in 2019 Asymptomatic Continue pindolol   Nonspecified mood disorder Continue Effexor , Risperdal  and Sinequan    Advance Care Planning:   Code Status: Full Code   VTE prophylaxis: Heparin  infusion  Consults: Oncology  Family Communication: Patient only  Severity of Illness: The appropriate patient status for this patient is INPATIENT. Inpatient status is judged to be reasonable and necessary in order to provide the required intensity of service to ensure the patient's safety. The patient's presenting symptoms, physical exam findings, and initial radiographic and laboratory data in the context of their chronic comorbidities is felt to place them at high risk for further clinical deterioration. Furthermore, it is not anticipated that the patient will be medically stable for discharge from the hospital within 2 midnights of admission.   * I certify that at the point of admission it is my clinical judgment that the patient will require inpatient hospital care spanning beyond 2 midnights from the point of admission due to high intensity of service, high risk for further deterioration and high frequency of surveillance required.*  Author: Isaiah Lever, NP 07/29/2023 3:44 PM  For on call review [email or web address].

## 2023-07-29 NOTE — ED Notes (Signed)
 Called [facility] for transport.

## 2023-07-29 NOTE — ED Notes (Signed)
 Pt ambulated to restroom independently with steady gait.

## 2023-07-29 NOTE — Progress Notes (Signed)
 ANTICOAGULATION CONSULT NOTE  Pharmacy Consult for Heparin  Indication: pulmonary embolus  Allergies  Allergen Reactions   Latuda  [Lurasidone  Hcl] Other (See Comments)    PER THE PT CAUSED RESTLESSNESS   Beclomethasone Dipropionate Hives and Other (See Comments)     weight gain   Flexeril  [Cyclobenzaprine ] Anxiety   Mometasone  Furo-Formoterol Fum Hives and Other (See Comments)    weight gain   Sulfonamide Derivatives Hives and Rash   Statins Other (See Comments)    Myalgias, RLS    Patient Measurements: Height: 5' 1 (154.9 cm) Weight: 76.2 kg (168 lb) IBW/kg (Calculated) : 47.8 Heparin  Dosing Weight: 64.7 kg   Vital Signs: Temp: 98.7 F (37.1 C) (02/08 1022) Temp Source: Oral (02/08 1022) BP: 126/76 (02/08 1000) Pulse Rate: 94 (02/08 1000)  Labs: Recent Labs    07/28/23 2112 07/29/23 0957  HGB 9.4*  --   HCT 30.5*  --   PLT 330  --   HEPARINUNFRC  --  0.66  CREATININE 0.67  --     Estimated Creatinine Clearance: 64.6 mL/min (by C-G formula based on SCr of 0.67 mg/dL).   Medical History: Past Medical History:  Diagnosis Date   Allergic bronchopulmonary aspergillosis (HCC) 2008   sees Dr Katheren Finn pulmonology   Anemia    iron  deficiency, resolved   Anxiety    Asthma    Breast cancer (HCC)    CAD (coronary artery disease)    a. LHC 6/16:  oOM1 60, pRCA 25 >> med Rx b. cath 3/19 2nd OM with 95% stenosis s/p synergy DES & anomalous RCA   CAP (community acquired pneumonia) 2016; 06/07/2016   Chronic bronchitis (HCC)    Chronic lower back pain    Complication of anesthesia    think I have a hard time waking up from it   Depression    mild   Diverticulitis    Diverticulosis    GERD (gastroesophageal reflux disease)    H/O hiatal hernia    Headache    weekly (08/23/2017)   History of echocardiogram    Echo 6/16:  Mod LVH, EF 60-65%, no RWMA, Gr 1 DD, trivial MR, normal LA size.   History of radiation therapy    Left breast-  12/26/22-01/23/23-Dr. Lynwood Nasuti   Hyperglycemia 11/20/2015   Hyperlipidemia, mixed 09/11/2007   Qualifier: Diagnosis of  By: Antonio ROSALEA Rockers   Did not tolerate Lipitor, zocor, Lovastatin, Pravastatin, Livalo , Crestor  even low dose    IBS (irritable bowel syndrome)    Maxillary sinusitis    Normal cardiac stress test 11/2011   No evidence of ischemia or infarct.   Calculated ejection fraction 72%.   Obesity    OSA (obstructive sleep apnea) 02/2012   has stopped using  cpap   Osteoarthritis    Osteoporosis    Personal history of radiation therapy    Pneumonia 11/2011   before 2013 I hadn't had pneumonia since I was a child (04/13/2012)   Pulmonary nodules    S/P angioplasty with stent 08/23/17 ostial 2nd OM with DES synnergy 08/24/2017   Schatzki's ring     Medications:  (Not in a hospital admission)  Scheduled:   [START ON 11/06/2023] denosumab   60 mg Subcutaneous Once   Infusions:   heparin  1,150 Units/hr (07/28/23 2341)   PRN:   Assessment: 67 yof presenting with SOB x 1 week. Patient is followed by pulmonology for bronchopulmonary aspergillosis (2008). History of OSA, CAD, HLD, CAP. Heparin  per pharmacy consult placed for pulmonary  embolus.  Initial heparin  level upper end therapeutic on 1150 units/hr  Goal of Therapy:  Heparin  level 0.3-0.7 units/ml Monitor platelets by anticoagulation protocol: Yes   Plan:  Slight decrease to 1100 units/hr Daily heparin  level, CBC, s/s bleeding F/u long term [facility] plan  Dorn Poot, PharmD, [facility] Clinical Pharmacist ED Pharmacist Phone # [phone] 07/29/2023 11:44 AM

## 2023-07-29 NOTE — Progress Notes (Signed)
 Lower extremity venous duplex completed. Please see CV Procedures for preliminary results.  Estanislao Heimlich, RVT 07/29/23 3:23 PM

## 2023-07-30 ENCOUNTER — Inpatient Hospital Stay (HOSPITAL_COMMUNITY): Payer: PPO

## 2023-07-30 DIAGNOSIS — I2602 Saddle embolus of pulmonary artery with acute cor pulmonale: Secondary | ICD-10-CM

## 2023-07-30 DIAGNOSIS — I2699 Other pulmonary embolism without acute cor pulmonale: Secondary | ICD-10-CM | POA: Diagnosis not present

## 2023-07-30 LAB — ECHOCARDIOGRAM COMPLETE
Area-P 1/2: 4.1 cm2
Calc EF: 60.8 %
Height: 61 in
S' Lateral: 2.6 cm
Single Plane A2C EF: 60.9 %
Single Plane A4C EF: 63.3 %
Weight: 2687.85 [oz_av]

## 2023-07-30 LAB — CBC
HCT: 29.1 % — ABNORMAL LOW (ref 36.0–46.0)
Hemoglobin: 8.6 g/dL — ABNORMAL LOW (ref 12.0–15.0)
MCH: 26.5 pg (ref 26.0–34.0)
MCHC: 29.6 g/dL — ABNORMAL LOW (ref 30.0–36.0)
MCV: 89.5 fL (ref 80.0–100.0)
Platelets: 312 10*3/uL (ref 150–400)
RBC: 3.25 MIL/uL — ABNORMAL LOW (ref 3.87–5.11)
RDW: 17.8 % — ABNORMAL HIGH (ref 11.5–15.5)
WBC: 3.8 10*3/uL — ABNORMAL LOW (ref 4.0–10.5)
nRBC: 0 % (ref 0.0–0.2)

## 2023-07-30 LAB — HEPARIN LEVEL (UNFRACTIONATED): Heparin Unfractionated: 0.36 [IU]/mL (ref 0.30–0.70)

## 2023-07-30 MED ORDER — APIXABAN 5 MG PO TABS: 5.0000 mg | ORAL_TABLET | Freq: Two times a day (BID) | ORAL | Status: DC

## 2023-07-30 MED ORDER — APIXABAN 5 MG PO TABS
10.0000 mg | ORAL_TABLET | Freq: Two times a day (BID) | ORAL | Status: DC
  Administered 2023-07-30: 10 mg via ORAL
  Filled 2023-07-30: qty 2

## 2023-07-30 MED ORDER — APIXABAN 5 MG PO TABS: ORAL_TABLET | ORAL | 0 refills | Status: DC

## 2023-07-30 MED ORDER — ORAL CARE MOUTH RINSE: 15.0000 mL | OROMUCOSAL | Status: DC | PRN

## 2023-07-30 NOTE — Progress Notes (Signed)
  Echocardiogram 2D Echocardiogram has been performed.  Victoria [name] 07/30/2023, 2:11 PM

## 2023-07-30 NOTE — Progress Notes (Signed)
Patient discharged: Home with family  Via: Wheelchair   Discharge paperwork given: to patient and family  Reviewed with teach back  IV and telemetry disconnected  Belongings given to patient    

## 2023-07-30 NOTE — TOC Initial Note (Addendum)
 Transition of Care [facility]) - Initial/Assessment Note    Patient Details  Name: Victoria [name] MRN: [id number] Date of Birth: May 20, 1956  Transition of Care [facility]) CM/SW Contact:    Sonda Manuella Quill, RN Phone Number: 07/30/2023, 4:41 PM  Clinical Narrative:                 [facility] consult for med assist, and Needs PA for NOAC for PE: Xarelto, Pradaxa or Eliquis ; Also has breast CA-please do PA on full dose Lovenox  in the event they prefer that to coumadin or NOAC; spoke w/ pt in room; pt says she lives at home w/ her spouse Victoria [name] ([phone]); she plans to return at d/c; pt says her spouse will provide transportation; pt verified PCP/insurance; she denies SDOH risks; pt says she is able to pay for her medication, and she has received coupon for Eliquis ; pt says she does not have DME, HH services, or home oxygen ; also notified Dr Lue and Isaiah Lever unable to verify benefits on weekend; no TOC needs.  Expected Discharge Plan: Home/Self Care Barriers to Discharge: No Barriers Identified   Patient Goals and CMS Choice Patient states their goals for this hospitalization and ongoing recovery are:: home CMS Medicare.gov Compare Post Acute Care list provided to:: Patient        Expected Discharge Plan and Services   Discharge Planning Services: CM Consult Post Acute Care Choice: NA Living arrangements for the past 2 months: Single Family Home Expected Discharge Date: 07/30/23               DME Arranged: N/A DME Agency: NA       HH Arranged: NA HH Agency: NA        Prior Living Arrangements/Services Living arrangements for the past 2 months: Single Family Home Lives with:: Spouse Patient language and need for interpreter reviewed:: Yes Do you feel safe going back to the place where you live?: Yes      Need for Family Participation in Patient Care: Yes (Comment) Care giver support system in place?: Yes (comment) Current home services:  (n/a) Criminal  Activity/Legal Involvement Pertinent to Current Situation/Hospitalization: No - Comment as needed  Activities of Daily Living   ADL Screening (condition at time of admission) Independently performs ADLs?: Yes (appropriate for developmental age) Is the patient deaf or have difficulty hearing?: No Does the patient have difficulty seeing, even when wearing glasses/contacts?: No Does the patient have difficulty concentrating, remembering, or making decisions?: No  Permission Sought/Granted Permission sought to share information with : Case Manager Permission granted to share information with : Yes, Verbal Permission Granted  Share Information with NAME: Case Manager     Permission granted to share info w Relationship: Mileidy Atkin (spouse) [phone]     Emotional Assessment Appearance:: Appears stated age Attitude/Demeanor/Rapport: Gracious Affect (typically observed): Accepting Orientation: : Oriented to Self, Oriented to Place, Oriented to  Time, Oriented to Situation Alcohol / Substance Use: Not Applicable Psych Involvement: No (comment)  Admission diagnosis:  Shortness of breath [R06.02] Pulmonary embolism (HCC) [I26.99] Other acute pulmonary embolism without acute cor pulmonale (HCC) [I26.99] Patient Active Problem List   Diagnosis Date Noted   Pulmonary embolism (HCC) 07/28/2023   Lymphocytic colitis 06/29/2023   Fecal soiling due to fecal incontinence 06/29/2023   Prolapse of female pelvic organs 10/18/2022   Breast cancer (HCC) 10/13/2022   Genetic testing 10/12/2022   PALB2 positive 10/12/2022   Primary malignant neoplasm of lower-outer quadrant of breast, left (HCC)  10/10/2022   Preoperative cardiovascular examination 09/20/2022   Major depressive disorder, recurrent episode, severe (HCC) 08/14/2022   Passive suicidal ideations 08/12/2022   Scleritis of right eye 02/18/2022   Lumbar radiculopathy 09/20/2021   Sacroiliac joint dysfunction of right side 08/16/2021    Greater trochanteric pain syndrome of left lower extremity 07/29/2021   Diverticulitis 07/07/2021   Anemia, iron  deficiency 09/21/2020   Family history of breast cancer 09/21/2020   Preventative health care 07/01/2020   Hemorrhoids 07/01/2020   LUQ discomfort 07/01/2020   Statin intolerance 01/10/2020   Occipital pain 06/27/2019   Tinnitus of both ears 06/27/2019   Statin myopathy 06/19/2019   Other cervical disc degeneration, unspecified cervical region 06/03/2019   Headache 03/19/2019   Heart disease 02/21/2019   Neck pain 02/17/2019   Hx of cold sores 10/09/2018   Bronchitis with asthma, acute 07/18/2018   Osteoporosis 05/23/2018   S/P angioplasty with stent 08/23/17 ostial 2nd OM with DES synnergy 08/24/2017   CAD (coronary artery disease)    Anxiety and depression 01/16/2017   Insomnia 01/16/2017   Acute recurrent sinusitis 05/27/2016   Muscle spasm 05/03/2016   Chronic pain 05/03/2016   Severe persistent asthma with intensive monitoring 03/22/2016   BMI 30.0-30.9,adult 02/18/2016   MRSA (methicillin resistant Staphylococcus aureus) colonization 01/12/2016   Sleep disorder due to stress 01/12/2016   Hyperglycemia 11/20/2015   Chronic diastolic CHF (congestive heart failure), NYHA class 2 (HCC) 06/26/2015   Coronary artery disease due to lipid rich plaque 06/26/2015   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HCC) 02/03/2015   Precordial chest pain    Cardiomegaly 11/21/2014   Seasonal and perennial allergic rhinitis 10/22/2011   Fatigue 09/01/2011   Chest pain, atypical 06/01/2011   OSA (obstructive sleep apnea) 11/13/2010   Umbilical hernia 06/11/2010   Diverticulosis of colon 06/11/2010   HEPATIC CYST 06/11/2010   Idiopathic scoliosis and kyphoscoliosis 06/11/2010   TRANSAMINASES, SERUM, ELEVATED 05/27/2010   Abdominal pain 01/15/2010   ELEVATED BP READING WITHOUT DX HYPERTENSION 08/03/2009   Gastroesophageal reflux disease with hiatal hernia 10/21/2008   h/o PALPITATIONS 08/19/2008    A B P A-ALLERGIC BRONCHOPULMONARY ASPERGILLOSIS 12/14/2007   Asthma 11/16/2007   Hyperlipidemia, mixed 09/11/2007   STRESS INCONTINENCE 09/11/2007   Cushing's syndrome (HCC) 12/11/2006   OA (osteoarthritis) of knee 12/11/2006   PCP:  Mannam, Praveen, MD Pharmacy:   [facility] - [place], [place] - [address] [place] [address] Phone: [phone] Fax: [phone]     Social Drivers of Health (SDOH) Social History: SDOH Screenings   Food Insecurity: No Food Insecurity (07/30/2023)  Housing: Low Risk  (07/30/2023)  Transportation Needs: No Transportation Needs (07/30/2023)  Utilities: Not At Risk (07/30/2023)  Alcohol Screen: Low Risk  (08/12/2022)  Depression (PHQ2-9): Low Risk  (07/12/2023)  Financial Resource Strain: Low Risk  (01/18/2023)  Physical Activity: Inactive (02/10/2023)  Social Connections: Socially Integrated (07/29/2023)  Stress: Stress Concern Present (01/18/2023)  Tobacco Use: Low Risk  (07/28/2023)  Health Literacy: Adequate Health Literacy (02/10/2023)   SDOH Interventions: Food Insecurity Interventions: Intervention Not Indicated, Inpatient TOC Housing Interventions: Intervention Not Indicated, Inpatient TOC Transportation Interventions: Intervention Not Indicated, Inpatient TOC Utilities Interventions: Intervention Not Indicated, Inpatient TOC   Readmission Risk Interventions    07/30/2023    4:38 PM  Readmission Risk Prevention Plan  Transportation Screening Complete  PCP or Specialist Appt within 5-7 Days Complete  Home Care Screening Complete  Medication Review (RN CM) Complete

## 2023-07-30 NOTE — Discharge Instructions (Signed)
 Information on my medicine - ELIQUIS  (apixaban )  Why was Eliquis  prescribed for you? Eliquis  was prescribed to treat blood clots that may have been found in the veins of your legs (deep vein thrombosis) or in your lungs (pulmonary embolism) and to reduce the risk of them occurring again.  What do You need to know about Eliquis  ? The starting dose is 10 mg (two 5 mg tablets) taken TWICE daily for the FIRST SEVEN (7) DAYS, then on 08/06/2023 (or as directed by Starter Kit)  the dose is reduced to ONE 5 mg tablet taken TWICE daily.  Eliquis  may be taken with or without food.   Try to take the dose about the same time in the morning and in the evening. If you have difficulty swallowing the tablet whole please discuss with your pharmacist how to take the medication safely.  Take Eliquis  exactly as prescribed and DO NOT stop taking Eliquis  without talking to the doctor who prescribed the medication.  Stopping may increase your risk of developing a new blood clot.  Refill your prescription before you run out.  After discharge, you should have regular check-up appointments with your healthcare provider that is prescribing your Eliquis .    What do you do if you miss a dose? If a dose of ELIQUIS  is not taken at the scheduled time, take it as soon as possible on the same day and twice-daily administration should be resumed. The dose should not be doubled to make up for a missed dose.  Important Safety Information A possible side effect of Eliquis  is bleeding. You should call your healthcare provider right away if you experience any of the following: Bleeding from an injury or your nose that does not stop. Unusual colored urine (red or dark brown) or unusual colored stools (red or black). Unusual bruising for unknown reasons. A serious fall or if you hit your head (even if there is no bleeding).  Some medicines may interact with Eliquis  and might increase your risk of bleeding or clotting while  on Eliquis . To help avoid this, consult your healthcare provider or pharmacist prior to using any new prescription or non-prescription medications, including herbals, vitamins, non-steroidal anti-inflammatory drugs (NSAIDs) and supplements.  This website has more information on Eliquis  (apixaban ): http://[email or web address] .com/eliquis dena

## 2023-07-30 NOTE — Plan of Care (Signed)

## 2023-07-30 NOTE — Discharge Summary (Signed)
 Physician Discharge Summary  Victoria [name] FMW:[id number] DOB: 06-17-1956 DOA: 07/28/2023  PCP: Mannam, Praveen, MD  Admit date: 07/28/2023 Discharge date: 07/30/2023  Admitted From: Home Disposition: Home  Recommendations for Outpatient Follow-up:  Follow up with PCP in 1-2 weeks Follow-up with pulmonology and oncology as scheduled  Home Health: None Equipment/Devices: None  Discharge Condition: Stable CODE STATUS: Full Diet recommendation: Regular diet as tolerated  Brief/Interim Summary: Victoria [name] is a 68 y.o. female with medical history significant of eosinophilic asthma, HFpEF, dyslipidemia, type 2 diabetes, Cushing syndrome, GERD, osteoarthritis, history of CAD with prior PTCA and stent to the OM 2 in 2019, new diagnosis of iron  deficiency anemia, malignant neoplasm of the left breast/invasive ductal carcinoma diagnosed in May 2024 currently on tamoxifen  and follow-up with Dr. Lanny. She presents with shortness of breath found to have bilateral PE on imaging.  Patient admitted as above with bilateral PE, asymptomatic without hypoxia, DVT studies negative, echocardiogram without overt abnormal findings -discussed case with oncology who recommended transitioning patient to DOAC, Eliquis  initiated without any symptoms or issues.  Patient otherwise stable and agreeable for discharge home.  Tamoxifen  to be discontinued per oncology recommendations but otherwise no medication changes unless outlined as below.   Discharge Diagnoses:  Principal Problem:   Pulmonary embolism [facility])    Discharge Instructions  Discharge Instructions     Call MD for:   Complete by: As directed    Any bleeding as discussed including black tarry stool or coffee-ground emesis   Call MD for:  difficulty breathing, headache or visual disturbances   Complete by: As directed    Call MD for:  extreme fatigue   Complete by: As directed    Call MD for:  persistant dizziness or light-headedness   Complete by:  As directed    Call MD for:  persistant nausea and vomiting   Complete by: As directed    Call MD for:  severe uncontrolled pain   Complete by: As directed    Call MD for:  temperature >100.4   Complete by: As directed    Diet - low sodium heart healthy   Complete by: As directed    Increase activity slowly   Complete by: As directed       Allergies as of 07/30/2023       Reactions   Latuda  [lurasidone  Hcl] Other (See Comments)   PER THE PT, CAUSED RESTLESSNESS   Beclomethasone Dipropionate Hives, Other (See Comments)   Weight gain, also   Flexeril  [cyclobenzaprine ] Anxiety   Mometasone  Furo-formoterol Fum Hives, Other (See Comments)   Weight gain, also   Sulfonamide Derivatives Hives, Rash   Statins Other (See Comments)   Myalgias, RLS   Fluticasone  Rash, Other (See Comments)   Weight gain, too        Medication List     STOP taking these medications    aspirin  EC 81 MG tablet   budesonide  3 MG 24 hr capsule Commonly known as: ENTOCORT EC    ibuprofen  200 MG tablet Commonly known as: ADVIL    tamoxifen  20 MG tablet Commonly known as: NOLVADEX        TAKE these medications    acetaminophen  650 MG CR tablet Commonly known as: TYLENOL  Take 650-1,300 mg by mouth every 8 (eight) hours as needed for pain.   albuterol  108 (90 Base) MCG/ACT inhaler Commonly known as: VENTOLIN  HFA Inhale 1-2 puffs into the lungs every 6 (six) hours as needed.   apixaban  5 MG Tabs tablet  Commonly known as: ELIQUIS  Take 2 tablets (10 mg total) by mouth 2 (two) times daily for 6 days, THEN 1 tablet (5 mg total) 2 (two) times daily. Start taking on: July 30, 2023   ascorbic acid 500 MG tablet Commonly known as: VITAMIN C Take 500 mg by mouth every evening.   CALCIUM  MAGNESIUM  PO Take 1 tablet by mouth at bedtime.   clonazePAM  0.5 MG tablet Commonly known as: KLONOPIN  Take 1 tablet (0.5 mg total) by mouth daily. What changed: when to take this   denosumab  60 MG/ML  Sosy injection Commonly known as: PROLIA  Inject 60 mg into the skin every 6 (six) months.   doxepin  25 MG capsule Commonly known as: SINEQUAN  Take 1 capsule (25 mg total) by mouth at bedtime.   famotidine  20 MG tablet Commonly known as: PEPCID  TAKE 1 TABLET BY MOUTH TWICE A DAY What changed: when to take this   Fasenra  Pen 30 MG/ML prefilled autoinjector Generic drug: benralizumab  Inject 1 mL (30 mg total) into the skin every 8 (eight) weeks.   ipratropium-albuterol  0.5-2.5 (3) MG/3ML Soln Commonly known as: DUONEB Take 3 mLs by nebulization every 6 (six) hours as needed. What changed: reasons to take this   metFORMIN  500 MG tablet Commonly known as: GLUCOPHAGE  Take 1 tablet (500 mg total) by mouth 2 (two) times daily with a meal.   nitroGLYCERIN  0.4 MG SL tablet Commonly known as: NITROSTAT  Place 1 tablet (0.4 mg total) under the tongue every 5 (five) minutes as needed for chest pain.   pindolol  5 MG tablet Commonly known as: VISKEN  Take 1 tablet (5 mg total) by mouth daily after breakfast.   Repatha  SureClick 140 MG/ML Soaj Generic drug: Evolocumab  Inject 140 mg into the skin every 14 (fourteen) days. What changed: when to take this   risperiDONE  0.5 MG tablet Commonly known as: RISPERDAL  Take 1 tablet (0.5 mg total) by mouth daily. Take 1 mg Risperdal  tab at bedtime What changed:  when to take this additional instructions   risperiDONE  1 MG tablet Commonly known as: RISPERDAL  Take 1 tablet (1 mg total) by mouth at bedtime. What changed: Another medication with the same name was changed. Make sure you understand how and when to take each.   Semaglutide (0.25 or 0.5MG /DOS) 2 MG/1.5ML Sopn Inject 0.25 mg into the skin every Friday.   venlafaxine  XR 75 MG 24 hr capsule Commonly known as: Effexor  XR Take 3 capsules (225 mg total) by mouth daily with breakfast.   Vitamin D3 Ultra Strength 125 MCG (5000 UT) capsule Generic drug: Cholecalciferol  Take 5,000 Units  by mouth every evening.        Allergies  Allergen Reactions   Latuda  [Lurasidone  Hcl] Other (See Comments)    PER THE PT, CAUSED RESTLESSNESS   Beclomethasone Dipropionate Hives and Other (See Comments)    Weight gain, also   Flexeril  [Cyclobenzaprine ] Anxiety   Mometasone  Furo-Formoterol Fum Hives and Other (See Comments)    Weight gain, also   Sulfonamide Derivatives Hives and Rash   Statins Other (See Comments)    Myalgias, RLS   Fluticasone  Rash and Other (See Comments)    Weight gain, too    Consultations: Oncology  Procedures/Studies: ECHOCARDIOGRAM COMPLETE Result Date: 07/30/2023    ECHOCARDIOGRAM REPORT   Patient Name:   Victoria [name] Date of Exam: 07/30/2023 Medical Rec #:  [id number]     Height:       61.0 in Accession #:    [id number]  Weight:       168.0 lb Date of Birth:  11/03/1955    BSA:          1.754 m Patient Age:    67 years      BP:           114/74 mmHg Patient Gender: F             HR:           63 bpm. Exam Location:  Inpatient Procedure: 2D Echo, Cardiac Doppler and Color Doppler Indications:    I26.02 Pulmonary embolus  History:        Patient has prior history of Echocardiogram examinations, most                 recent 02/13/2023. Cardiomegaly, CAD, Abnormal ECG,                 Signs/Symptoms:Bacteremia and Chest Pain; Risk                 Factors:Dyslipidemia and Sleep Apnea. Breast cancer.  Sonographer:    Ellouise Mose RDCS Referring Phys: [id number] ALLISON L ELLIS IMPRESSIONS  1. Left ventricular ejection fraction, by estimation, is 60 to 65%. The left ventricle has normal function. The left ventricle has no regional wall motion abnormalities. Left ventricular diastolic parameters are consistent with Grade I diastolic dysfunction (impaired relaxation). Elevated left ventricular end-diastolic pressure.  2. Right ventricular systolic function is normal. The right ventricular size is normal. Tricuspid regurgitation signal is inadequate for assessing PA pressure.  3.  Right atrial size was moderately dilated.  4. The mitral valve is normal in structure. Trivial mitral valve regurgitation. No evidence of mitral stenosis.  5. The aortic valve is tricuspid. Aortic valve regurgitation is mild. No aortic stenosis is present.  6. Aortic dilatation noted. There is mild dilatation of the ascending aorta, measuring 40 mm. Comparison(s): No significant change from prior study. FINDINGS  Left Ventricle: Left ventricular ejection fraction, by estimation, is 60 to 65%. The left ventricle has normal function. The left ventricle has no regional wall motion abnormalities. The left ventricular internal cavity size was normal in size. There is  no left ventricular hypertrophy. Left ventricular diastolic parameters are consistent with Grade I diastolic dysfunction (impaired relaxation). Elevated left ventricular end-diastolic pressure. Right Ventricle: The right ventricular size is normal. No increase in right ventricular wall thickness. Right ventricular systolic function is normal. Tricuspid regurgitation signal is inadequate for assessing PA pressure. Left Atrium: Left atrial size was normal in size. Right Atrium: Right atrial size was moderately dilated. Pericardium: There is no evidence of pericardial effusion. Mitral Valve: The mitral valve is normal in structure. Trivial mitral valve regurgitation. No evidence of mitral valve stenosis. Tricuspid Valve: The tricuspid valve is normal in structure. Tricuspid valve regurgitation is trivial. No evidence of tricuspid stenosis. Aortic Valve: The aortic valve is tricuspid. Aortic valve regurgitation is mild. No aortic stenosis is present. Pulmonic Valve: The pulmonic valve was not well visualized. Pulmonic valve regurgitation is not visualized. No evidence of pulmonic stenosis. Aorta: Aortic dilatation noted and the aortic root is normal in size and structure. There is mild dilatation of the ascending aorta, measuring 40 mm. IAS/Shunts: No atrial  level shunt detected by color flow Doppler.  LEFT VENTRICLE PLAX 2D LVIDd:         4.00 cm     Diastology LVIDs:         2.60 cm     LV  e' medial:    4.24 cm/s LV PW:         1.00 cm     LV E/e' medial:  18.9 LV IVS:        0.80 cm     LV e' lateral:   5.66 cm/s LVOT diam:     2.30 cm     LV E/e' lateral: 14.2 LV SV:         84 LV SV Index:   48 LVOT Area:     4.15 cm  LV Volumes (MOD) LV vol d, MOD A2C: 52.7 ml LV vol d, MOD A4C: 51.2 ml LV vol s, MOD A2C: 20.6 ml LV vol s, MOD A4C: 18.8 ml LV SV MOD A2C:     32.1 ml LV SV MOD A4C:     51.2 ml LV SV MOD BP:      33.3 ml RIGHT VENTRICLE             IVC RV S prime:     15.40 cm/s  IVC diam: 1.00 cm TAPSE (M-mode): 3.0 cm LEFT ATRIUM             Index        RIGHT ATRIUM           Index LA diam:        3.30 cm 1.88 cm/m   RA Area:     20.10 cm LA Vol (A2C):   28.1 ml 16.02 ml/m  RA Volume:   62.20 ml  35.47 ml/m LA Vol (A4C):   25.4 ml 14.48 ml/m LA Biplane Vol: 26.2 ml 14.94 ml/m  AORTIC VALVE LVOT Vmax:   117.00 cm/s LVOT Vmean:  69.500 cm/s LVOT VTI:    0.202 m  AORTA Ao Root diam: 3.00 cm Ao Asc diam:  3.90 cm MITRAL VALVE MV Area (PHT): 4.10 cm    SHUNTS MV Decel Time: 185 msec    Systemic VTI:  0.20 m MV E velocity: 80.15 cm/s  Systemic Diam: 2.30 cm MV A velocity: 95.45 cm/s MV E/A ratio:  0.84 Vishnu Priya Mallipeddi Electronically signed by Diannah Late Mallipeddi Signature Date/Time: 07/30/2023/2:18:03 PM    Final    VAS US  LOWER EXTREMITY VENOUS (DVT) Result Date: 07/30/2023  Lower Venous DVT Study Patient Name:  Victoria [name]  Date of Exam:   07/29/2023 Medical Rec #: [id number]      Accession #:    [id number] Date of Birth: 1955/11/25     Patient Gender: F Patient Age:   66 years Exam Location:  [facility] Procedure:      VAS US  LOWER EXTREMITY VENOUS (DVT) Referring Phys: ISAIAH LEVER --------------------------------------------------------------------------------  Indications: Pulmonary embolism.  Risk Factors: Hx of PE past pregnancy.  Comparison Study: None. Performing Technologist: [name] Rockers  Examination Guidelines: A complete evaluation includes B-mode imaging, spectral Doppler, color Doppler, and power Doppler as needed of all accessible portions of each vessel. Bilateral testing is considered an integral part of a complete examination. Limited examinations for reoccurring indications may be performed as noted. The reflux portion of the exam is performed with the patient in reverse Trendelenburg.  +---------+---------------+---------+-----------+----------+-------------------+ RIGHT    CompressibilityPhasicitySpontaneityPropertiesThrombus Aging      +---------+---------------+---------+-----------+----------+-------------------+ CFV      Full           Yes      Yes                                      +---------+---------------+---------+-----------+----------+-------------------+  SFJ      Full                                                             +---------+---------------+---------+-----------+----------+-------------------+ FV Prox  Full                                                             +---------+---------------+---------+-----------+----------+-------------------+ FV Mid   Full                                                             +---------+---------------+---------+-----------+----------+-------------------+ FV DistalFull                                                             +---------+---------------+---------+-----------+----------+-------------------+ PFV      Full                                                             +---------+---------------+---------+-----------+----------+-------------------+ POP      Full           Yes      Yes                                      +---------+---------------+---------+-----------+----------+-------------------+ PTV      Full                                                              +---------+---------------+---------+-----------+----------+-------------------+ PERO     Full                                         Not well visualized +---------+---------------+---------+-----------+----------+-------------------+   +---------+---------------+---------+-----------+----------+-------------------+ LEFT     CompressibilityPhasicitySpontaneityPropertiesThrombus Aging      +---------+---------------+---------+-----------+----------+-------------------+ CFV      Full           Yes      Yes                                      +---------+---------------+---------+-----------+----------+-------------------+ SFJ      Full                                                             +---------+---------------+---------+-----------+----------+-------------------+  FV Prox  Full                                                             +---------+---------------+---------+-----------+----------+-------------------+ FV Mid   Full                                                             +---------+---------------+---------+-----------+----------+-------------------+ FV DistalFull                                                             +---------+---------------+---------+-----------+----------+-------------------+ PFV      Full                                                             +---------+---------------+---------+-----------+----------+-------------------+ POP      Full           Yes      Yes                                      +---------+---------------+---------+-----------+----------+-------------------+ PTV      Full                                                             +---------+---------------+---------+-----------+----------+-------------------+ PERO     Full                                         Not well visualized +---------+---------------+---------+-----------+----------+-------------------+      Summary: BILATERAL: - No evidence of deep vein thrombosis seen in the lower extremities, bilaterally. -No evidence of popliteal cyst, bilaterally.   *See table(s) above for measurements and observations. Electronically signed by Fonda Rim on 07/30/2023 at 10:28:43 AM.    Final    CT Angio Chest PE W/Cm &/Or Wo Cm Addendum Date: 07/28/2023 ADDENDUM REPORT: 07/28/2023 22:56 ADDENDUM: Critical Value/emergent results were called by telephone at the time of interpretation on 07/28/2023 at 10:56 pm to provider SCOTT ZACKOWSKI , who verbally acknowledged these results. Electronically Signed   By: Leita Birmingham M.D.   On: 07/28/2023 22:56   Result Date: 07/28/2023 CLINICAL DATA:  Pulmonary embolism suspected, high probability. Borderline D-dimer. Shortness of breath with fever. EXAM: CT ANGIOGRAPHY CHEST WITH CONTRAST TECHNIQUE: Multidetector CT imaging of the chest was performed using the standard protocol during bolus administration of intravenous contrast. Multiplanar CT image reconstructions  and MIPs were obtained to evaluate the vascular anatomy. RADIATION DOSE REDUCTION: This exam was performed according to the departmental dose-optimization program which includes automated exposure control, adjustment of the mA and/or kV according to patient size and/or use of iterative reconstruction technique. CONTRAST:  75mL OMNIPAQUE  IOHEXOL  350 MG/ML SOLN COMPARISON:  04/08/2021. FINDINGS: Cardiovascular: The heart is mildly enlarged and there is no pericardial effusion. Multi-vessel coronary artery calcifications are noted. There is atherosclerotic calcification of the aorta without evidence of aneurysm. The pulmonary trunk is normal in caliber. Pulmonary emboli are identified in the right pulmonary artery extending into the lobar, segmental and subsegmental arteries in the left upper and lower lobes. Pulmonary emboli are noted in the lobar, segmental, and subsegmental arteries in the right lower and middle lobes. There is  no evidence of right heart strain. Mediastinum/Nodes: No mediastinal, hilar, axillary lymphadenopathy. The trachea and esophagus are within normal limits. There is a large hiatal hernia. Lungs/Pleura: Pleural and parenchymal scarring is noted at the lung apices bilaterally. Strandy atelectasis, scarring, or infiltrates are present in the left upper lobe. There is atelectasis in the left lower lobe. Bronchiectasis with associated atelectasis or scarring is noted in the left upper lobe. Multiple pulmonary nodules are noted on the right measuring up to 6 mm in the right lower lobe, axial image 76 and unchanged from 2017 and likely benign. No effusion or pneumothorax. Upper Abdomen: Scattered diverticula are noted along the colon. No acute abnormality. Musculoskeletal: Surgical changes are noted in the left breast. Degenerative changes are present in the thoracic spine. No acute osseous abnormality Review of the MIP images confirms the above findings. IMPRESSION: 1. Bilateral pulmonary emboli. No evidence of right heart strain is seen. 2. Atelectasis, infiltrate, or scarring in the left upper lobe, new from the previous exam. 3. Large hiatal hernia. 4. Coronary artery calcifications. 5. Aortic atherosclerosis. Electronically Signed: By: Leita Birmingham M.D. On: 07/28/2023 22:39   DG Chest 2 View Result Date: 07/28/2023 CLINICAL DATA:  Shortness of breath. EXAM: CHEST - 2 VIEW COMPARISON:  March 17, 2021 FINDINGS: The heart size and mediastinal contours are within normal limits. Mild bilateral infrahilar atelectatic changes are seen. No pleural effusion or pneumothorax is identified. A predominantly stable moderate to large hiatal hernia is noted. Multilevel degenerative changes are seen throughout the thoracic spine. IMPRESSION: 1. Mild bilateral infrahilar atelectatic changes. 2. Moderate to large hiatal hernia. Electronically Signed   By: Suzen Dials M.D.   On: 07/28/2023 21:25     Subjective: No acute  issues or events overnight denies nausea vomiting diarrhea constipation headache fevers chills or chest pain   Discharge Exam: Vitals:   07/30/23 0637 07/30/23 0846  BP: 125/65 114/74  Pulse: 86   Resp: 18 15  Temp: 98.2 F (36.8 C) 98.1 F (36.7 C)  SpO2: 97% 98%   Vitals:   07/29/23 2100 07/30/23 0204 07/30/23 0637 07/30/23 0846  BP: 124/76 131/72 125/65 114/74  Pulse:  97 86   Resp:  18 18 15   Temp: 98.3 F (36.8 C) 98.9 F (37.2 C) 98.2 F (36.8 C) 98.1 F (36.7 C)  TempSrc: Oral Oral  Oral  SpO2: 99% 96% 97% 98%  Weight:      Height:        General: Pt is alert, awake, not in acute distress Cardiovascular: RRR, S1/S2 +, no rubs, no gallops Respiratory: CTA bilaterally, no wheezing, no rhonchi Abdominal: Soft, NT, ND, bowel sounds + Extremities: no edema, no cyanosis  The results of significant diagnostics from this hospitalization (including imaging, microbiology, ancillary and laboratory) are listed below for reference.     Microbiology: No results found for this or any previous visit (from the past 240 hours).   Labs: BNP (last 3 results) Recent Labs    07/28/23 2112  BNP 26.2   Basic Metabolic Panel: Recent Labs  Lab 07/28/23 2112  NA 137  K 3.6  CL 103  CO2 26  GLUCOSE 100*  BUN 13  CREATININE 0.67  CALCIUM  8.8*   Liver Function Tests: No results for input(s): AST, ALT, ALKPHOS, BILITOT, PROT, ALBUMIN  in the last 168 hours. No results for input(s): LIPASE, AMYLASE in the last 168 hours. No results for input(s): AMMONIA in the last 168 hours. CBC: Recent Labs  Lab 07/28/23 2112 07/30/23 0554  WBC 5.2 3.8*  HGB 9.4* 8.6*  HCT 30.5* 29.1*  MCV 86.9 89.5  PLT 330 312   Cardiac Enzymes: Recent Labs  Lab 07/25/23 1050  TROPONINI 3   BNP: Invalid input(s): POCBNP CBG: No results for input(s): GLUCAP in the last 168 hours. D-Dimer No results for input(s): DDIMER in the last 72 hours. Hgb A1c No  results for input(s): HGBA1C in the last 72 hours. Lipid Profile No results for input(s): CHOL, HDL, LDLCALC, TRIG, CHOLHDL, LDLDIRECT in the last 72 hours. Thyroid  function studies No results for input(s): TSH, T4TOTAL, T3FREE, THYROIDAB in the last 72 hours.  Invalid input(s): FREET3 Anemia work up No results for input(s): VITAMINB12, FOLATE, FERRITIN, TIBC, IRON , RETICCTPCT in the last 72 hours. Urinalysis    Component Value Date/Time   COLORURINE YELLOW 06/30/2020 1420   APPEARANCEUR CLEAR 06/30/2020 1420   LABSPEC 1.015 06/30/2020 1420   PHURINE 7.0 06/30/2020 1420   GLUCOSEU NEGATIVE 06/30/2020 1420   HGBUR NEGATIVE 06/30/2020 1420   HGBUR negative 12/03/2009 0000   BILIRUBINUR NEGATIVE 06/30/2020 1420   BILIRUBINUR neg 11/02/2012 1400   KETONESUR NEGATIVE 06/30/2020 1420   PROTEINUR 30 (A) 11/08/2014 1311   UROBILINOGEN 0.2 06/30/2020 1420   NITRITE NEGATIVE 06/30/2020 1420   LEUKOCYTESUR NEGATIVE 06/30/2020 1420   Sepsis Labs Recent Labs  Lab 07/28/23 2112 07/30/23 0554  WBC 5.2 3.8*   Microbiology No results found for this or any previous visit (from the past 240 hours).   Time coordinating discharge: Over 30 minutes  SIGNED:   Elsie JAYSON [place], DO [facility] 07/30/2023, 3:30 PM Pager   If 7PM-7AM, please contact night-coverage [email or web address]

## 2023-07-31 ENCOUNTER — Other Ambulatory Visit: Payer: PPO

## 2023-07-31 ENCOUNTER — Telehealth: Payer: Self-pay | Admitting: *Deleted

## 2023-07-31 ENCOUNTER — Telehealth: Payer: Self-pay | Admitting: Hematology

## 2023-07-31 ENCOUNTER — Encounter: Payer: Self-pay | Admitting: Genetic Counselor

## 2023-07-31 NOTE — Telephone Encounter (Signed)
 Rescheduled and scheduled appointments per 2/7 scheduling message.Patient is aware of the changes made and is active on MyChart.

## 2023-07-31 NOTE — Transitions of Care (Post Inpatient/ED Visit) (Signed)
   07/31/2023  Name: Victoria [name] MRN: [id number] DOB: Mar 18, 1956  Today's TOC FU Call Status: Today's TOC FU Call Status:: Unsuccessful Call (1st Attempt) Unsuccessful Call (1st Attempt) Date: 07/31/23  Attempted to reach the patient regarding the most recent Inpatient/ED visit.  Follow Up Plan: Additional outreach attempts will be made to reach the patient to complete the Transitions of Care (Post Inpatient/ED visit) call.   Una Ganser BSN RN [facility] Health Care Management Coordinator [email or web address] Direct Dial: [phone]  Fax: [phone] Website: [email or web address]

## 2023-08-02 ENCOUNTER — Encounter: Payer: Self-pay | Admitting: Internal Medicine

## 2023-08-02 ENCOUNTER — Ambulatory Visit: Payer: PPO

## 2023-08-02 ENCOUNTER — Telehealth: Payer: Self-pay

## 2023-08-02 ENCOUNTER — Ambulatory Visit: Payer: PPO | Admitting: Internal Medicine

## 2023-08-02 VITALS — BP 113/78 | HR 89 | Temp 98.6°F | Resp 16 | Ht 61.0 in | Wt 166.6 lb

## 2023-08-02 VITALS — BP 124/72 | HR 100 | Temp 98.2°F | Ht 61.0 in | Wt 167.0 lb

## 2023-08-02 DIAGNOSIS — J4550 Severe persistent asthma, uncomplicated: Secondary | ICD-10-CM | POA: Diagnosis not present

## 2023-08-02 DIAGNOSIS — Y92240 Courthouse as the place of occurrence of the external cause: Secondary | ICD-10-CM

## 2023-08-02 DIAGNOSIS — D649 Anemia, unspecified: Secondary | ICD-10-CM | POA: Diagnosis not present

## 2023-08-02 DIAGNOSIS — D509 Iron deficiency anemia, unspecified: Secondary | ICD-10-CM

## 2023-08-02 DIAGNOSIS — C50512 Malignant neoplasm of lower-outer quadrant of left female breast: Secondary | ICD-10-CM

## 2023-08-02 DIAGNOSIS — I2699 Other pulmonary embolism without acute cor pulmonale: Secondary | ICD-10-CM | POA: Diagnosis not present

## 2023-08-02 DIAGNOSIS — C50912 Malignant neoplasm of unspecified site of left female breast: Secondary | ICD-10-CM

## 2023-08-02 MED ORDER — IRON SUCROSE 300 MG IVPB - SIMPLE MED: 300.0000 mg | Freq: Once | Status: DC

## 2023-08-02 MED ORDER — DIPHENHYDRAMINE HCL 25 MG PO CAPS: 25.0000 mg | ORAL_CAPSULE | Freq: Once | ORAL | Status: DC

## 2023-08-02 MED ORDER — IRON SUCROSE 20 MG/ML IV SOLN
300.0000 mg | Freq: Once | INTRAVENOUS | Status: AC
  Administered 2023-08-02: 300 mg via INTRAVENOUS
  Filled 2023-08-02: qty 15

## 2023-08-02 MED ORDER — ACETAMINOPHEN 325 MG PO TABS: 650.0000 mg | ORAL_TABLET | Freq: Once | ORAL | Status: DC

## 2023-08-02 NOTE — Transitions of Care (Post Inpatient/ED Visit) (Signed)
   08/02/2023  Name: Victoria [name] MRN: [id number] DOB: 1956-05-28  Today's TOC FU Call Status: Today's TOC FU Call Status:: Unsuccessful Call (2nd Attempt) Unsuccessful Call (2nd Attempt) Date: 08/02/23  Attempted to reach the patient regarding the most recent Inpatient/ED visit.   Patient noted on documentation that patient was in the PCP for OV and then infusion at time of outreach attempt. Left confidential VM will call at a later time.   Follow Up Plan: Additional outreach attempts will be made to reach the patient to complete the Transitions of Care (Post Inpatient/ED visit) call.    Gabriel Cirri MSN, RN RN Case Sales executive [employer]  [facility] Office Hours Wed/Thur  8:00 am-6:00 pm Direct Dial: [phone] Main Phone [phone]  Fax: [phone] [email or web address]

## 2023-08-02 NOTE — Progress Notes (Signed)
Diagnosis: Iron Deficiency Anemia  Provider:  Chilton Greathouse MD  Procedure: IV Infusion  IV Type: Peripheral, IV Location: L Antecubital  Venofer (Iron Sucrose), Dose: 300 mg  Infusion Start Time: 1400  Infusion Stop Time: 1544  Post Infusion IV Care: Patient declined observation and Peripheral IV Discontinued  Discharge: Condition: Good, Destination: Home . AVS Declined  Performed by:  Rico Ala, LPN

## 2023-08-02 NOTE — Progress Notes (Signed)
OV 10/06/2016  Chief Complaint  Patient presents with   Follow-up    Pt states her asthma is good; had one flare up since last visit. SOB with exertion, cough with yellow to brown mucus prodcution, with frequent wheezing. Notice improvement with being on [employer]      68 year old female with severe asthma with ABPA and elevated IgE and eosinophilia. She is on IL5RAb antibody treatment now for the last few months. She feels this is helping her significantly. Her symptoms course of better. She is on Asmanex, Spiriva, Singulair as well. She is not on daily prednisone anymore. In December 2017 should she did grow MRSA from sputum culture. Currently a few days ago she developed sore throat and having some sputum production. She is worried this might be MRSA. Also asthma itself is stable and well controlled according to history. She is wondering about getting a sputum culture and really wishes for it    OV 01/11/2017  Chief Complaint  Patient presents with   Follow-up    Pt states her breathing is doing well. Pt states the last couple days she has had more wheezing. Pt states she has noticed an improvement being on Cinqair.    68 year old female with severe asthma with ABPA and elevated IgE and eosinophilia. She is on IL5RAb antibody treatment.  She is on Asmanex, Spiriva, Singulair as well. She is not on daily prednisone anymore   01/11/2017 - Here for routine follow-up. She is on interleukin-5 receptor antibody IV infusion therapy. This was held a few weeks ago because of asthma exacerbation. She says approximately on Father's Day 2018 he started feeling unwell and then ended up with an exacerbation for which she needed antibiotics and prednisone. She is surprised by this decompensation despite being on this biologic immunotherapy for asthma. She is compliant with her Asmanex, Spiriva and Singulair. She is not on prednisone. Currently she feels baseline. Patient infusion #6 or 7 is due  for tomorrow [interleukin-5 receptor antibody). Overall she feels improved quality of life. Despite all this exhaled nitric oxide continues to be HIGH; unclear why   OV 04/21/2017  Chief Complaint  Patient presents with   Follow-up    Pt states that she has been doing good. Has had no flare-ups with her asthma. Mild coughing at night. Denies any SOB or CP.  severe asthma with ABPA and elevated IgE and eosinophilia. She is on IL5RAb antibody treatment.  She is on Asmanex, Spiriva, Singulair as well. She is not on daily prednisone anymore  Continues cinqair spiriva, singulair, and asmanex. At this point in time the interleukin-5 receptor antibody is working really well for her. For nitric oxide today is lowest ever.. In terms of his symptoms she hardly ever gets up at night. When she wakes up she does not have any symptoms. She only has slight limitation with physical activity. She's not shortness of breath because of asthma not wheezing and not using albuterol for rescue use. The asthma control score is 0.8. However she is extremely frustrated by the [facility] gravida cycle management of her interleukin-5 receptor antibody infusions. She is getting different bills wrong bills missed bills. Therefore she wants to switch to subcutaneous injectable therapy. She is very upset about the quality of care that she has received at the infusion center because of billing practices.  OV 10/19/2017  Chief Complaint  Patient presents with   Follow-up    PFT done today. Pt states  she has been doing well. Has had a little more coughing and wheezing recently and thinks it could be due to the weather. Pt just got started back on Fasenra injections.   Severe persistent asthma with ABPA elevated IgE and eosinophilia. S/P cinqair interleukin-5 receptor antibody treatment ending" 2018 with good response but stopped secondary to billing errors at [facility] infusion center. Based on medical need then  switched to anti-eosinophil interleukin-5 receptor antibody subcutaneous injection treatment Fasenra early 2019. She is also on baseline Asmanex, Spiriva, Singulair and antihistamine 8. She is not on daily prednisone anymore.  Last seen November 2018. Overall she was doing well with good effort tolerance but in March 2019 had some atypical chest painand ended up with a coronary artery stent in the abuse marginal. She is improved from that. She is wondering about taking a beta blocker safely. She does not feel that his anemia because of blood pressure is low but apparently cardiology is asked her to clear this with me. In the interim her IV injection interleukin-5 receptor antibody has been changed to subcutaneous injection FASENRA . Because of her cardiac issues she's only taken 3 doses since January 2019. She hopes to go back on schedule with this. She had billing issues at the [facility] infusion center with the IV infusion and that is why we switched to the subcutaneous injection. However, it looks like the workflow authorization for this was not done correctly and she isn't of the large co-pay. She is unhappy about this and routinely discussed this with the patient. In terms of asthma she feels her cough is decompensated in the last few weeks and this correlates with her not taking her Asmanex. Her lung function test is near normal today. Her exhaled nitric oxide is slightly elevated than the lowest that she's had but still better than her baseline average. Currently she is waking up at nightcoughing because of the spring pollen but only hardly. Very mild symptoms when she wakes up she is slightly limited in the daytime and she experiences shortness of breath little of the time not using albuterol for rescue.    Results for Victoria [name] (MRN [id number]) as of 10/19/2017 11:51  Ref. Range 10/19/2017 10:47  FEV1-Post Latest Units: L 1.80  FEV1-%Pred-Post Latest Units: % 76  FEV1-%Change-Post Latest Units:  % 0  Results for Victoria [name] (MRN [id number]) as of 10/19/2017 11:51  Ref. Range 10/19/2017 10:47  TLC Latest Units: L 4.90  TLC % pred Latest Units: % 103  Results for GENECIS, VELEY (MRN [id number]) as of 10/19/2017 11:51  Ref. Range 10/19/2017 10:47  DLCO unc Latest Units: ml/min/mmHg 19.51  DLCO unc % pred Latest Units: % 90    OV 05/14/2018  Subjective:  Patient ID: Victoria [name], female , DOB: April 29, 1956 , age 89 y.o. , MRN: [id number] , ADDRESS: [address] [place] [address]   05/14/2018 -   Chief Complaint  Patient presents with   Follow-up    breathing good, SOB on exertion   Severe persistent asthma with ABPA elevated IgE and eosinophilia. S/P cinqair interleukin-5 receptor antibody treatment ending" 2018 with good response but stopped secondary to billing errors at [facility] infusion center. Based on medical need then switched to anti-eosinophil interleukin-5 receptor antibody subcutaneous injection treatment Fasenra early 2019. She is also on baseline Asmanex, Spiriva, Singulair and antihistamine 8. She is not on daily prednisone anymore.  Known hiatal hernia  HPI Victoria [name] 68 y.o. -returns  for follow-up of severe persistent asthma.  Earlier this year we switched her to interleukin-5 receptor antibody subcutaneous injection may be [employer] called Fasenra.  With this she feels her asthma symptoms have improved dramatically.  She feels it is better than seeing care.  She still thankful to the scientist of developed this biologic.  Her exam nitric oxide is the lowest ever at 27.  She is not having any nighttime symptoms or any wheezing.  In fact she stopped her inhaled steroid partly because she was feeling well but partly also because she is in the donut hole.  However she is continuing her Spiriva and Singulair.  She is up-to-date with her flu shot.    She is dealing with skin infections.  Primary care physician notes reviewed.  There is question whether this  is being precipitated by the fact she is on to Biologics.  She thinks she had MRSA but when I reviewed the chart on May 08, 2018 shows MSSA.  Therefore she is been started on Augmentin today which she is yet to start.  She also has hiatal hernia.  She has not seen GI in a few years.  It is fairly well-controlled and she plans to see GI soon.  OV 02/14/2019  Subjective:  Patient ID: Victoria [name], female , DOB: 03/11/1956 , age 26 y.o. , MRN: [id number] , ADDRESS: [address] [place] [address]   02/14/2019 -   Chief Complaint  Patient presents with   Severe persistent asthma with intensive monitoring   Severe persistent asthma with ABPA elevated IgE and eosinophilia. S/P cinqair interleukin-5 receptor antibody treatment ending" 2018 with good response but stopped secondary to billing errors at [facility] infusion center. Based on medical need then switched to anti-eosinophil interleukin-5 receptor antibody subcutaneous injection treatment Fasenra early 2019. She is  NOT on baseline Asmanex, Spiriva, Singulair and antihistamine 8. She is not on daily prednisone anymore.  Known hiatal hernia  HPI Victoria [name] 68 y.o. -is here for follow-up of asthma with ABPA.  Since her last visit she is only on biologic Fasenra.  She has stopped Singulair, Spiriva, Asmanex.  She is not taking daily prednisone.  Asthma is well controlled.  A few weeks ago she did have a itchy feeling intake epinephrine.  She also had wheeze at this time.  This helped control the situation.  Other than that she is doing well asthma is extremely well controlled.  No nocturnal awakenings no albuterol rescue use.  We are unable to do exhaled nitric oxide testing today because of the pandemic.  She told me that she wants to remove budesonide out of her allergy list because she is only allergic to [facility] and not the nebulizer.  She is doing social distancing.  She had an appointment earlier in the year with GI because of  her hiatal hernia but because of the pandemic she did not follow through.  She is open to having this appointment and referral made again.    OV 05/04/2020  Subjective:  Patient ID: Victoria [name], female , DOB: Jun 12, 1956 , age 81 y.o. , MRN: [id number] , ADDRESS: [address] [place] [address] PCP Bradd Canary, MD Patient Care Team: Bradd Canary, MD as PCP - General (Family Medicine) Lars Masson, MD as PCP - Cardiology (Cardiology) Haverstock, Elvin So, MD as Referring Physician (Dermatology) Kalman Shan, MD as Consulting Physician (Pulmonary Disease) Lars Masson, MD as Consulting Physician (Cardiology) Marcelle Overlie, MD  as Consulting Physician (Obstetrics and Gynecology) Suzanna Obey, MD as Consulting Physician (Otolaryngology) Pyrtle, Carie Caddy, MD as Consulting Physician (Gastroenterology) Drema Dallas, DO as Consulting Physician (Neurology)  This Provider for this visit: Treatment Team:  Attending Provider: Kalman Shan, MD    05/04/2020 -   Chief Complaint  Patient presents with   Follow-up    Pt c/o cough since 1 day ago- prod with yellow sputum. She has also been wheezing some. She uses her duoneb 4-6 x per wk.      HPI CAMBRE MATSON 68 y.o. -presents for follow-up of a complicated asthma with ABPA.  She has been overall doing well with Harrington Challenger.  However in September 2021 after being exposed to someone with Covid she had respiratory exacerbation but she was ruled out for Covid.  She took 5 days of prednisone and did well but she had a cough that lingered through October 2021.  Then she developed chest pains.  We did a CT angiogram that ruled out PE.  I was involved in the decision making.  It only showed hiatal hernia.  I personally visualized that CT again.  She can got worried that the Harrington Challenger is not working because of that one respiratory exacerbation and the lingering cough.  In addition she tells me that yesterday while  eating some crackers she choked and today she is wheezing.  She is noted to be on alendronate for the last 2 years.  This is by primary care physician.  In the spring 2020 when she did see GI Dr. Rhea Belton and interventional endoscopy has been recommended but she is so far holding off on that.  Her acid reflux regimen has been escalated.  She is up-to-date with her vaccine but is inquiring about Covid booster timing.   Asthma Control Test ACT Total Score  05/04/2020 19     Lab Results  Component Value Date   NITRICOXIDE 42 04/21/2017     OV 06/04/2021  Subjective:  Patient ID: Victoria [name], female , DOB: 1956/03/01 , age 60 y.o. , MRN: [id number] , ADDRESS: [address] [place] [address] PCP Bradd Canary, MD Patient Care Team: Bradd Canary, MD as PCP - General (Family Medicine) Lars Masson, MD (Inactive) as PCP - Cardiology (Cardiology) Haverstock, Elvin So, MD as Referring Physician (Dermatology) Kalman Shan, MD as Consulting Physician (Pulmonary Disease) Lars Masson, MD (Inactive) as Consulting Physician (Cardiology) Marcelle Overlie, MD as Consulting Physician (Obstetrics and Gynecology) Suzanna Obey, MD as Consulting Physician (Otolaryngology) Pyrtle, Carie Caddy, MD as Consulting Physician (Gastroenterology) Drema Dallas, DO as Consulting Physician (Neurology)  This Provider for this visit: Treatment Team:  Attending Provider: Kalman Shan, MD    06/04/2021 -   Chief Complaint  Patient presents with   Follow-up    Pt states she has been doing okay since last visit. States she has had sinus pressure and also some pain in sinuses.    Severe persistent asthma with ABPA elevated IgE and eosinophilia. S/P cinqair interleukin-5 receptor antibody treatment ending" 2018 with good response but stopped secondary to billing errors at [facility] infusion center. Based on medical need then switched to anti-eosinophil interleukin-5 receptor  antibody subcutaneous injection treatment Fasenra early 2019. She is  NOT on baseline Asmanex, Spiriva, Singulair and antihistamine 8. She is not on daily prednisone anymore.  Known hiatal hernia  HPI Victoria [name] 68 y.o. -returns for follow-up.  Is a routine follow-up.  At this point in  time she is on biologic Fasenra.  She is not taking any of her inhalers or Singulair.  She did see Tammy.  In the summer 2022 had an exacerbation had to go on prednisone.  Personally saw her a year ago.  Currently asthma is well controlled.  She says she has intermittent wheezing and is wondering if she should be on a maintenance inhaler.  She asked me about evidence in literature about monotherapy with Harrington Challenger.  Explained to her that Biologics was studied as add-on therapy and she needs to be on a maintenance therapy.  She is agreeable to go back on her Asmanex.  This because she does have some occasional wheezing. ACT score shows good control  Of note she is having chronic headaches.  She has been on [place] since 2019.  Prior to that she was started on Repatha.  She is also on Prolia These can cause headaches.    She had CT angio which shows stable hiatale hernia.   Asthma Control Test ACT Total Score  06/04/2021 23  03/17/2021 16  05/04/2020 19    Lab Results  Component Value Date   NITRICOXIDE 42 04/21/2017     CT Chest data  No results found.      OV 09/06/2021  Subjective:  Patient ID: Victoria [name], female , DOB: 1955-10-19 , age 71 y.o. , MRN: [id number] , ADDRESS: [address] [place] [address] PCP Bradd Canary, MD Patient Care Team: Bradd Canary, MD as PCP - General (Family Medicine) Lars Masson, MD as PCP - Cardiology (Cardiology) Haverstock, Elvin So, MD as Referring Physician (Dermatology) Kalman Shan, MD as Consulting Physician (Pulmonary Disease) Lars Masson, MD as Consulting Physician (Cardiology) Marcelle Overlie, MD as Consulting  Physician (Obstetrics and Gynecology) Suzanna Obey, MD as Consulting Physician (Otolaryngology) Pyrtle, Carie Caddy, MD as Consulting Physician (Gastroenterology) Drema Dallas, DO as Consulting Physician (Neurology)  This Provider for this visit: Treatment Team:  Attending Provider: Kalman Shan, MD    09/06/2021 -   Chief Complaint  Patient presents with   Follow-up    Pt denies any flare ups with her asthma since last visit. States that she does use her rescue inhaler when she needs it. States that she is still having pain in her sternum and ribs and in back which she has had since last visit.     Severe persistent asthma with ABPA elevated IgE and eosinophilia. S/P cinqair interleukin-5 receptor antibody treatment ending" 2018 with good response but stopped secondary to billing errors at [facility] infusion center. Based on medical need then switched to anti-eosinophil interleukin-5 receptor antibody subcutaneous injection treatment Fasenra early 2019. She is  NOT on baseline Asmanex, Spiriva, Singulair and antihistamine 8. She is not on daily prednisone anymore.  Known hiatal hernia   HPI Victoria [name] 68 y.o. -returns for her 20-month follow-up.  Asthma itself is well controlled.  However she is dealing with significant spinal issues.  She follows with Dr. Cherene Altes at a clinic in [place].  She tells me that her scoliosis is worse and the kyphosis is worse.  She is lost 1.25 inches in height and the kyphosis is worse by 13 degrees.  Associated with that she is having persistent left rib cage pain and also sternal pain.  She states at 1 point on 08/15/2021 the pain was so bad she went to the ER and called EMS.  She has been treated with meloxicam.  She also saw Dr.  Noreene Filbert and's sports medicine is being given left hip injection.  She is attended massage therapy on 08/27/2021 and periodically is used walker.  Currently somewhat better.  She has had CT scans.  She also had ANA that was positive on  09/03/2021.  The results available between 1: 80 and 1: 320.  Apparently rheumatology referral is in place.  Asthma and hiatal hernia itself under control.  She is quite bothered by the pain.  This is despite physical therapy.  She is frustrated by this.    (H): Data is abnormally highh  OV 03/07/2022  Subjective:  Patient ID: Victoria [name], female , DOB: April 30, 1956 , age 78 y.o. , MRN: [id number] , ADDRESS: [address] [place] [address] PCP Bradd Canary, MD Patient Care Team: Bradd Canary, MD as PCP - General (Family Medicine) Lars Masson, MD as PCP - Cardiology (Cardiology) Haverstock, Elvin So, MD as Referring Physician (Dermatology) Kalman Shan, MD as Consulting Physician (Pulmonary Disease) Lars Masson, MD as Consulting Physician (Cardiology) Marcelle Overlie, MD as Consulting Physician (Obstetrics and Gynecology) Suzanna Obey, MD as Consulting Physician (Otolaryngology) Pyrtle, Carie Caddy, MD as Consulting Physician (Gastroenterology) Drema Dallas, DO as Consulting Physician (Neurology)  This Provider for this visit: Treatment Team:  Attending Provider: Kalman Shan, MD    03/07/2022 -   Chief Complaint  Patient presents with   Follow-up    Pt states she had covid about 3 weeks ago and states she still has a cough that she cannot get rid of.      HPI Victoria [name] 68 y.o. -returns for follow-up.  She continues on [place].  Last visit I reintroduced inhaled corticosteroid Asmanex but for some reason she is not on it.  We went over the rationale for Biologics which is failed inhaler therapy.  Did express concern that if she is not on inhaler therapy that her Harrington Challenger could be denied in the future.  She is going to restart Asmanex.  Currently she does not have any acid reflux symptoms.  Her back pain is better after she got new orthotics that increases her shoe height.  The only issue is that 3 weeks ago she had COVID-19.  She did take  antiviral and then she got better.  She did have some residual cough but primary care physician has given her some Hydromet.  She says the cough is working its way out.  There are no other new issues.  She will have a flu shot today.   OV 09/15/2022  Subjective:  Patient ID: Victoria [name], female , DOB: 11-24-1955 , age 24 y.o. , MRN: [id number] , ADDRESS: [address] [place] [address] PCP Bradd Canary, MD Patient Care Team: Bradd Canary, MD as PCP - General (Family Medicine) Lars Masson, MD as PCP - Cardiology (Cardiology) Haverstock, Elvin So, MD as Referring Physician (Dermatology) Kalman Shan, MD as Consulting Physician (Pulmonary Disease) Lars Masson, MD as Consulting Physician (Cardiology) Marcelle Overlie, MD as Consulting Physician (Obstetrics and Gynecology) Suzanna Obey, MD as Consulting Physician (Otolaryngology) Pyrtle, Carie Caddy, MD as Consulting Physician (Gastroenterology) Drema Dallas, DO as Consulting Physician (Neurology)  This Provider for this visit: Treatment Team:  Attending Provider: Kalman Shan, MD   Severe persistent asthma with ABPA elevated IgE and eosinophilia. S/P cinqair interleukin-5 receptor antibody treatment ending" 2018 with good response but stopped secondary to billing errors at [facility] infusion center. Based on medical need then switched to  anti-eosinophil interleukin-5 receptor antibody subcutaneous injection treatment Fasenra early 2019. She is  NOT on baseline Asmanex, Spiriva, Singulair and antihistamine 8. She is not on daily prednisone anymore.  -March 2024 medications include Alwyn Pea, Flonase, DuoNeb  Known hiatal hernia  ANA positive - saw Dr Corliss Skains Sept 2023 and reassured  09/15/2022 -   Chief Complaint  Patient presents with   Follow-up    Follow up. "A lot going on"      HPI Victoria [name] 68 y.o. -returns for follow-up.  Last seen in September 2023.  Is a routine asthma  follow-up.  From an asthma perspective she is doing well.  She continues to [place].  She is yet to restart her inhaled steroid Asmanex and she wants a refill for that.  The new issues that she is having surgery for hysterectomy and bilateral salpingo-oophorectomy because of bladder prolapse.  We did a evaluation and I deem her to be low moderate risk for pulmonary complications as long as there is no asthma flareup.  The other interim issues that she had major depression and anxiety and was hospitalized and [facility] for 3 days towards the end of February 2024.  She is also undergoing breast biopsy next week.  All this is naturally upsetting for her but she did indicate that asthma itself is stable.     OV 12/23/2022  Subjective:  Patient ID: Victoria [name], female , DOB: [birth year] , age 53 y.o. , MRN: [id number] , ADDRESS: [address] [place] [address] PCP Bradd Canary, MD Patient Care Team: Bradd Canary, MD as PCP - General (Family Medicine) Meriam Sprague, MD as PCP - Cardiology (Cardiology) Haverstock, Elvin So, MD as Referring Physician (Dermatology) Kalman Shan, MD as Consulting Physician (Pulmonary Disease) Lars Masson, MD as Consulting Physician (Cardiology) Marcelle Overlie, MD as Consulting Physician (Obstetrics and Gynecology) Suzanna Obey, MD as Consulting Physician (Otolaryngology) Pyrtle, Carie Caddy, MD as Consulting Physician (Gastroenterology) Donnelly Angelica, RN as Oncology Nurse Navigator Pershing Proud, RN as Oncology Nurse Navigator Griselda Miner, MD as Consulting Physician (General Surgery) Malachy Mood, MD as Consulting Physician (Hematology) Antony Blackbird, MD as Consulting Physician (Radiation Oncology)  This Provider for this visit: Treatment Team:  Attending Provider: Kalman Shan, MD   12/23/2022 -   Chief Complaint  Patient presents with   Follow-up    Cough x 3 days.  Sx worse at bedtime.     HPI Victoria [name] 68 y.o. -returns for follow-up but she is actually acutely having symptoms.  She says last week her son was ill with bronchitis but is being managed conservatively and expectantly.  This because his respiratory exam was normal.  However she tells me on 12/20/2022 she has picked up similar illness with a sore throat subsequently a cough.  She has not been able to sleep in her bed and is sleeping in a recliner because of the cough.  Today the sputum turned green.  There is no active wheezing no fever no nausea no no vomiting no diarrhea no urgent care or emergency room visits.  Of note last visit we added Asmanex to her asthma control because she is only on Fasenra right now but she says our office did not send the Asmanex.  Will try to reinitiate this prescription.  Past medical - She had a hysterectomy and it went well.  Then got diagnosed with breast cancer on the left side on 11/04/2022 with lumpectomy.  She is scheduled for radiation for 4 weeks.  Also confirmed on external record review.    OV 04/28/2023  Subjective:  Patient ID: Victoria [name], female , DOB: 15-Dec-1955 , age 50 y.o. , MRN: [id number] , ADDRESS: [address] [place] [id number] PCP Bradd Canary, MD Patient Care Team: Bradd Canary, MD as PCP - General (Family Medicine) Rollene Rotunda, MD as PCP - Cardiology (Cardiology) Haverstock, Elvin So, MD as Referring Physician (Dermatology) Kalman Shan, MD as Consulting Physician (Pulmonary Disease) Lars Masson, MD as Consulting Physician (Cardiology) Marcelle Overlie, MD as Consulting Physician (Obstetrics and Gynecology) Suzanna Obey, MD as Consulting Physician (Otolaryngology) Pyrtle, Carie Caddy, MD as Consulting Physician (Gastroenterology) Donnelly Angelica, RN as Oncology Nurse Navigator Pershing Proud, RN as Oncology Nurse Navigator Griselda Miner, MD as Consulting Physician (General Surgery) Malachy Mood, MD as Consulting Physician  (Hematology) Antony Blackbird, MD as Consulting Physician (Radiation Oncology)  This Provider for this visit: Treatment Team:  Attending Provider: Kalman Shan, MD    04/28/2023 -   Chief Complaint  Patient presents with   Follow-up    Pt denies any concerns      HPI Victoria [name] 68 y.o. -returns for follow-up.  She tells me her asthma is under control.  She is doing well with biologic Fasenra.  She forgets to take her inhaled steroid.  I did remind her of the necessity to do that.  Her new issues shortness of breath on exertion relieved by rest which is worse than baseline.'s been going on for several months.  She has gained 15 pounds or so in the last year.  She is also stopped exercising because of her back pain in the last 18 months.  It is present with exertion relieved by rest. Medical record review indicates Dr. Brock Ra the primary care physician did a cardiac stress test was normal.  Did echocardiogram has grade 1 diastolic dysfunction.  Review of the medications indicates she is on beta-blocker pindolol.  She tells me she has been on this since February 2024 for anxiety.  Did indicate to her that this can make asthma worse.  Exercise hypoxemia test here just walking around our clinic she did not desaturate.  Infection become tachycardic.  In terms of her social history: She has a only son who is disabled.  The she is planning to graduate him into a nursing home she is finding this emotionally tough.     OV 08/02/2023  Subjective:  Patient ID: Victoria [name], female , DOB: Mar 07, 1956 , age 41 y.o. , MRN: [id number] , ADDRESS: [address] [place] [id number] PCP Chilton Greathouse, MD Patient Care Team: Chilton Greathouse, MD as PCP - General (Pulmonary Disease) Rollene Rotunda, MD as PCP - Cardiology (Cardiology) Haverstock, Elvin So, MD as Referring Physician (Dermatology) Kalman Shan, MD as Consulting Physician (Pulmonary Disease) Lars Masson,  MD as Consulting Physician (Cardiology) Marcelle Overlie, MD as Consulting Physician (Obstetrics and Gynecology) Suzanna Obey, MD as Consulting Physician (Otolaryngology) Pyrtle, Carie Caddy, MD as Consulting Physician (Gastroenterology) Donnelly Angelica, RN as Oncology Nurse Navigator Pershing Proud, RN as Oncology Nurse Navigator Griselda Miner, MD as Consulting Physician (General Surgery) Malachy Mood, MD as Consulting Physician (Hematology) Antony Blackbird, MD as Consulting Physician (Radiation Oncology)  This Provider for this visit: Treatment Team:  Attending Provider: Kalman Shan, MD    Severe persistent asthma with ABPA elevated IgE and eosinophilia. S/P cinqair interleukin-5 receptor antibody treatment  ending" 2018 with good response but stopped secondary to billing errors at [facility] infusion center. Based on medical need then switched to anti-eosinophil interleukin-5 receptor antibody subcutaneous injection treatment Fasenra early 2019. She is  NOT on baseline Asmanex, Spiriva, Singulair and antihistamine 8. She is not on daily prednisone anymore.  -March 2024 medications include Alwyn Pea, Flonase, DuoNeb  Known hiatal hernia  - sees DR Rhea Belton  ANA positive - saw Dr Corliss Skains Sept 2023 and reassured  08/02/2023 -   Chief Complaint  Patient presents with   Follow-up    Went to ED 07/28/23 with elevated D Dimer and SOB- dx with PE and started on Eliquis. She has DOE with walking short distances. She has occ dry cough.      HPI Victoria [name] 68 y.o. -returns for acute follow-up posthospital.  On 07/28/2023 she had a high D-dimer and I called her.  She ended up in the ER stayed in the hospital for 48 hours diagnosed with pulmonary embolism.  BNP was normal.  She was not on oxygen.  Right ventricular function on echo was normal.  She is now on Eliquis tolerating it well no side effects.  No bleeding episodes.  But she says she still has shortness of breath with exertion  relieved by rest.  Asthma itself is stable  She wants a letter to get out of jury duty.   Simple office walk 224 (66+46 x 2) feet Pod A at [facility] x  3 laps goal with forehead probe 04/28/2023    O2 used ra   Number laps completed 2   Comments about pace normal   Resting Pulse Ox/HR 98% and 71/min   Final Pulse Ox/HR 98% and 85/min   Desaturated </= 88% no   Desaturated <= 3% points no   Got Tachycardic >/= 90/min nno   Symptoms at end of test Mild    Miscellaneous comments x       PFT     Latest Ref Rng & Units 10/19/2017   10:47 AM  PFT Results  FVC-Pre L 2.65   FVC-Predicted Pre % 86   FVC-Post L 2.57   FVC-Predicted Post % 84   Pre FEV1/FVC % % 67   Post FEV1/FCV % % 70   FEV1-Pre L 1.79   FEV1-Predicted Pre % 75   FEV1-Post L 1.80   DLCO uncorrected ml/min/mmHg 19.51   DLCO UNC% % 90   DLVA Predicted % 104   TLC L 4.90   TLC % Predicted % 103   RV % Predicted % 118        LAB RESULTS last 96 hours ECHOCARDIOGRAM COMPLETE Result Date: 07/30/2023    ECHOCARDIOGRAM REPORT   Patient Name:   HAMNA ASA Date of Exam: 07/30/2023 Medical Rec #:  [id number]     Height:       61.0 in Accession #:    [id number]    Weight:       168.0 lb Date of Birth:  09/19/1955    BSA:          1.754 m Patient Age:    67 years      BP:           114/74 mmHg Patient Gender: F             HR:           63 bpm. Exam Location:  Inpatient Procedure: 2D Echo, Cardiac Doppler and Color Doppler Indications:  I26.02 Pulmonary embolus  History:        Patient has prior history of Echocardiogram examinations, most                 recent 02/13/2023. Cardiomegaly, CAD, Abnormal ECG,                 Signs/Symptoms:Bacteremia and Chest Pain; Risk                 Factors:Dyslipidemia and Sleep Apnea. Breast cancer.  Sonographer:    Sheralyn Boatman RDCS Referring Phys: [id number] ALLISON L ELLIS IMPRESSIONS  1. Left ventricular ejection fraction, by estimation, is 60 to 65%. The left ventricle has normal function.  The left ventricle has no regional wall motion abnormalities. Left ventricular diastolic parameters are consistent with Grade I diastolic dysfunction (impaired relaxation). Elevated left ventricular end-diastolic pressure.  2. Right ventricular systolic function is normal. The right ventricular size is normal. Tricuspid regurgitation signal is inadequate for assessing PA pressure.  3. Right atrial size was moderately dilated.  4. The mitral valve is normal in structure. Trivial mitral valve regurgitation. No evidence of mitral stenosis.  5. The aortic valve is tricuspid. Aortic valve regurgitation is mild. No aortic stenosis is present.  6. Aortic dilatation noted. There is mild dilatation of the ascending aorta, measuring 40 mm. Comparison(s): No significant change from prior study. FINDINGS  Left Ventricle: Left ventricular ejection fraction, by estimation, is 60 to 65%. The left ventricle has normal function. The left ventricle has no regional wall motion abnormalities. The left ventricular internal cavity size was normal in size. There is  no left ventricular hypertrophy. Left ventricular diastolic parameters are consistent with Grade I diastolic dysfunction (impaired relaxation). Elevated left ventricular end-diastolic pressure. Right Ventricle: The right ventricular size is normal. No increase in right ventricular wall thickness. Right ventricular systolic function is normal. Tricuspid regurgitation signal is inadequate for assessing PA pressure. Left Atrium: Left atrial size was normal in size. Right Atrium: Right atrial size was moderately dilated. Pericardium: There is no evidence of pericardial effusion. Mitral Valve: The mitral valve is normal in structure. Trivial mitral valve regurgitation. No evidence of mitral valve stenosis. Tricuspid Valve: The tricuspid valve is normal in structure. Tricuspid valve regurgitation is trivial. No evidence of tricuspid stenosis. Aortic Valve: The aortic valve is  tricuspid. Aortic valve regurgitation is mild. No aortic stenosis is present. Pulmonic Valve: The pulmonic valve was not well visualized. Pulmonic valve regurgitation is not visualized. No evidence of pulmonic stenosis. Aorta: Aortic dilatation noted and the aortic root is normal in size and structure. There is mild dilatation of the ascending aorta, measuring 40 mm. IAS/Shunts: No atrial level shunt detected by color flow Doppler.  LEFT VENTRICLE PLAX 2D LVIDd:         4.00 cm     Diastology LVIDs:         2.60 cm     LV e' medial:    4.24 cm/s LV PW:         1.00 cm     LV E/e' medial:  18.9 LV IVS:        0.80 cm     LV e' lateral:   5.66 cm/s LVOT diam:     2.30 cm     LV E/e' lateral: 14.2 LV SV:         84 LV SV Index:   48 LVOT Area:     4.15 cm  LV Volumes (MOD) LV vol d,  MOD A2C: 52.7 ml LV vol d, MOD A4C: 51.2 ml LV vol s, MOD A2C: 20.6 ml LV vol s, MOD A4C: 18.8 ml LV SV MOD A2C:     32.1 ml LV SV MOD A4C:     51.2 ml LV SV MOD BP:      33.3 ml RIGHT VENTRICLE             IVC RV S prime:     15.40 cm/s  IVC diam: 1.00 cm TAPSE (M-mode): 3.0 cm LEFT ATRIUM             Index        RIGHT ATRIUM           Index LA diam:        3.30 cm 1.88 cm/m   RA Area:     20.10 cm LA Vol (A2C):   28.1 ml 16.02 ml/m  RA Volume:   62.20 ml  35.47 ml/m LA Vol (A4C):   25.4 ml 14.48 ml/m LA Biplane Vol: 26.2 ml 14.94 ml/m  AORTIC VALVE LVOT Vmax:   117.00 cm/s LVOT Vmean:  69.500 cm/s LVOT VTI:    0.202 m  AORTA Ao Root diam: 3.00 cm Ao Asc diam:  3.90 cm MITRAL VALVE MV Area (PHT): 4.10 cm    SHUNTS MV Decel Time: 185 msec    Systemic VTI:  0.20 m MV E velocity: 80.15 cm/s  Systemic Diam: 2.30 cm MV A velocity: 95.45 cm/s MV E/A ratio:  0.84 Vishnu Priya Mallipeddi Electronically signed by Winfield Rast Mallipeddi Signature Date/Time: 07/30/2023/2:18:03 PM    Final    VAS [name] LOWER EXTREMITY VENOUS (DVT) Result Date: 07/30/2023  Lower Venous DVT Study Patient Name:  KARY SUGRUE  Date of Exam:   07/29/2023 Medical Rec  #: [id number]      Accession #:    [id number] Date of Birth: 07-19-1955     Patient Gender: F Patient Age:   23 years Exam Location:  [facility] Procedure:      VAS [name] LOWER EXTREMITY VENOUS (DVT) Referring Phys: Junious Silk --------------------------------------------------------------------------------  Indications: Pulmonary embolism.  Risk Factors: Hx of PE past pregnancy. Comparison Study: None. Performing Technologist: Shona Simpson  Examination Guidelines: A complete evaluation includes B-mode imaging, spectral Doppler, color Doppler, and power Doppler as needed of all accessible portions of each vessel. Bilateral testing is considered an integral part of a complete examination. Limited examinations for reoccurring indications may be performed as noted. The reflux portion of the exam is performed with the patient in reverse Trendelenburg.  +---------+---------------+---------+-----------+----------+-------------------+ RIGHT    CompressibilityPhasicitySpontaneityPropertiesThrombus Aging      +---------+---------------+---------+-----------+----------+-------------------+ CFV      Full           Yes      Yes                                      +---------+---------------+---------+-----------+----------+-------------------+ SFJ      Full                                                             +---------+---------------+---------+-----------+----------+-------------------+ FV Prox  Full                                                             +---------+---------------+---------+-----------+----------+-------------------+  FV Mid   Full                                                             +---------+---------------+---------+-----------+----------+-------------------+ FV DistalFull                                                             +---------+---------------+---------+-----------+----------+-------------------+ PFV      Full                                                              +---------+---------------+---------+-----------+----------+-------------------+ POP      Full           Yes      Yes                                      +---------+---------------+---------+-----------+----------+-------------------+ PTV      Full                                                             +---------+---------------+---------+-----------+----------+-------------------+ PERO     Full                                         Not well visualized +---------+---------------+---------+-----------+----------+-------------------+   +---------+---------------+---------+-----------+----------+-------------------+ LEFT     CompressibilityPhasicitySpontaneityPropertiesThrombus Aging      +---------+---------------+---------+-----------+----------+-------------------+ CFV      Full           Yes      Yes                                      +---------+---------------+---------+-----------+----------+-------------------+ SFJ      Full                                                             +---------+---------------+---------+-----------+----------+-------------------+ FV Prox  Full                                                             +---------+---------------+---------+-----------+----------+-------------------+ FV Mid   Full                                                             +---------+---------------+---------+-----------+----------+-------------------+  FV DistalFull                                                             +---------+---------------+---------+-----------+----------+-------------------+ PFV      Full                                                             +---------+---------------+---------+-----------+----------+-------------------+ POP      Full           Yes      Yes                                       +---------+---------------+---------+-----------+----------+-------------------+ PTV      Full                                                             +---------+---------------+---------+-----------+----------+-------------------+ PERO     Full                                         Not well visualized +---------+---------------+---------+-----------+----------+-------------------+     Summary: BILATERAL: - No evidence of deep vein thrombosis seen in the lower extremities, bilaterally. -No evidence of popliteal cyst, bilaterally.   *See table(s) above for measurements and observations. Electronically signed by Gerarda Fraction on 07/30/2023 at 10:28:43 AM.    Final          has a past medical history of Allergic bronchopulmonary aspergillosis (HCC) (2008), Anemia, Anxiety, Asthma, Breast cancer (HCC), CAD (coronary artery disease), CAP (community acquired pneumonia) (2016; 06/07/2016), Chronic bronchitis (HCC), Chronic lower back pain, Complication of anesthesia, Depression, Diverticulitis, Diverticulosis, GERD (gastroesophageal reflux disease), H/O hiatal hernia, Headache, History of echocardiogram, History of radiation therapy, Hyperglycemia (11/20/2015), Hyperlipidemia, mixed (09/11/2007), IBS (irritable bowel syndrome), Maxillary sinusitis, Normal cardiac stress test (11/2011), Obesity, OSA (obstructive sleep apnea) (02/2012), Osteoarthritis, Osteoporosis, Personal history of radiation therapy, Pneumonia (11/2011), Pulmonary nodules, S/P angioplasty with stent 08/23/17 ostial 2nd OM with DES synnergy (08/24/2017), and Schatzki's ring.   reports that she has never smoked. She has never been exposed to tobacco smoke. She has never used smokeless tobacco.  Past Surgical History:  Procedure Laterality Date   ANTERIOR AND POSTERIOR REPAIR N/A 10/18/2022   Procedure: ANTERIOR (CYSTOCELE);  Surgeon: Marcelle Overlie, MD;  [facility] OR;  Service: Gynecology;  Laterality: N/A;   APPENDECTOMY   1989   BREAST BIOPSY  11/03/2022   MM LT RADIOACTIVE SEED LOC MAMMO GUIDE 11/03/2022 [facility] MAMMOGRAPHY   BREAST LUMPECTOMY WITH RADIOACTIVE SEED LOCALIZATION Left 11/04/2022   Procedure: LEFT BREAST LUMPECTOMY WITH RADIOACTIVE SEED LOCALIZATION;  Surgeon: Griselda Miner, MD;  Location: [facility];  Service: General;  Laterality: Left;   CARDIAC CATHETERIZATION N/A 11/25/2014  Procedure: Right/Left Heart Cath and Coronary Angiography;  Surgeon: Lyn Records, MD;  Location: [facility] INVASIVE CV LAB;  Service: Cardiovascular;  Laterality: N/A;   CESAREAN SECTION  1985   COLONOSCOPY  03/2022   CORONARY ANGIOPLASTY WITH STENT PLACEMENT  08/23/2017   CORONARY STENT INTERVENTION N/A 08/23/2017   Procedure: CORONARY STENT INTERVENTION;  Surgeon: Kathleene Hazel, MD;  Location: [facility] INVASIVE CV LAB;  Service: Cardiovascular;  Laterality: N/A;   HERNIA REPAIR  04/13/2012   VHR laparoscopic   LAPAROSCOPIC BILATERAL SALPINGO OOPHERECTOMY Bilateral 10/18/2022   Procedure: LAPAROTOMY WITH BILATERAL SALPINGO OOPHORECTOMY;  Surgeon: Marcelle Overlie, MD;  Location: [facility] OR;  Service: Gynecology;  Laterality: Bilateral;   LEFT HEART CATH AND CORONARY ANGIOGRAPHY N/A 08/23/2017   Procedure: LEFT HEART CATH AND CORONARY ANGIOGRAPHY;  Surgeon: Kathleene Hazel, MD;  Location: [facility] INVASIVE CV LAB;  Service: Cardiovascular;  Laterality: N/A;   VAGINAL HYSTERECTOMY N/A 10/18/2022   Procedure: HYSTERECTOMY VAGINAL;  Surgeon: Marcelle Overlie, MD;  Location: [facility] OR;  Service: Gynecology;  Laterality: N/A;   VENTRAL HERNIA REPAIR  04/13/2012   Procedure: LAPAROSCOPIC VENTRAL HERNIA;  Surgeon: [name], MD;  Location: [facility] OR;  Service: General;  Laterality: N/A;  laparoscopic repair of incarcerated hernia    Allergies  Allergen Reactions   Latuda [Lurasidone Hcl] Other (See Comments)    PER THE PT, CAUSED RESTLESSNESS   Beclomethasone Dipropionate Hives and Other (See Comments)    Weight  gain, also   Flexeril [Cyclobenzaprine] Anxiety   Mometasone Furo-Formoterol Fum Hives and Other (See Comments)    Weight gain, also   Sulfonamide Derivatives Hives and Rash   Statins Other (See Comments)    Myalgias, RLS   Fluticasone Rash and Other (See Comments)    "Weight gain," too    Immunization History  Administered Date(s) Administered   Influenza Split 04/20/2011, 04/20/2020   Influenza Whole 06/06/2007, 04/15/2008, 04/02/2009, 03/29/2012   Influenza, High Dose Seasonal PF 05/05/2015   Influenza,inj,Quad PF,6+ Mos 05/09/2013, 03/03/2014, 05/03/2016, 04/21/2017, 04/02/2018, 03/19/2019, 04/20/2020, 05/03/2021, 03/07/2022   Influenza-Unspecified 04/17/2023   Moderna Sars-Covid-2 Vaccination 08/15/2019, 09/17/2019, 05/16/2020, 12/04/2020   PNEUMOCOCCAL CONJUGATE-20 04/25/2023   Pfizer Covid-19 Vaccine Bivalent Booster 54yrs & up 04/16/2021   Pneumococcal Conjugate-13 05/09/2013   Pneumococcal Polysaccharide-23 05/04/2005   Td 07/29/2009   Tdap 03/11/2015   Zoster Recombinant(Shingrix) 05/09/2019, 08/01/2019    Family History  Problem Relation Age of Onset   Breast cancer Mother 15       bilateral breast cancer dx. 64, met to liver   Hypertension Mother    Diabetes Mother    Diverticulosis Father    Prostate cancer Father    Breast cancer Sister 41       DCIS at 29, IDC at 53, PALB2+   Cancer Sister        breast cancer, invasive ductal carcinoma in 2022,DCIS at 39 with 4 weeks of radiation, 5 years of Tamoxifen    Pulmonary embolism Brother        recurrent   Heart attack Maternal Grandfather    Ovarian cancer Paternal Grandmother    Cerebral palsy Son    Prostate cancer Paternal Uncle    Breast cancer Niece 25       PALB2+   Osteoporosis Niece    Stroke Neg Hx    Colon cancer Neg Hx    Esophageal cancer Neg Hx    Stomach cancer Neg Hx    Rectal cancer Neg Hx  Current Outpatient Medications:    acetaminophen (TYLENOL) 650 MG CR tablet, Take  650-1,300 mg by mouth every 8 (eight) hours as needed for pain., Disp: , Rfl:    albuterol (VENTOLIN HFA) 108 (90 Base) MCG/ACT inhaler, Inhale 1-2 puffs into the lungs every 6 (six) hours as needed., Disp: 8 g, Rfl: 2   apixaban (ELIQUIS) 5 MG TABS tablet, Take 2 tablets (10 mg total) by mouth 2 (two) times daily for 6 days, THEN 1 tablet (5 mg total) 2 (two) times daily., Disp: 60 tablet, Rfl: 0   ascorbic acid (VITAMIN C) 500 MG tablet, Take 500 mg by mouth every evening., Disp: , Rfl:    Benralizumab (FASENRA PEN) 30 MG/ML SOAJ, Inject 1 mL (30 mg total) into the skin every 8 (eight) weeks., Disp: 1 mL, Rfl: 2   Calcium-Magnesium-Vitamin D (CALCIUM MAGNESIUM PO), Take 1 tablet by mouth at bedtime., Disp: , Rfl:    Cholecalciferol (VITAMIN D3 ULTRA STRENGTH) 125 MCG (5000 UT) capsule, Take 5,000 Units by mouth every evening., Disp: , Rfl:    clonazePAM (KLONOPIN) 0.5 MG tablet, Take 1 tablet (0.5 mg total) by mouth daily. (Patient taking differently: Take 0.5 mg by mouth in the morning.), Disp: 30 tablet, Rfl: 1   denosumab (PROLIA) 60 MG/ML SOSY injection, Inject 60 mg into the skin every 6 (six) months., Disp: , Rfl:    doxepin (SINEQUAN) 25 MG capsule, Take 1 capsule (25 mg total) by mouth at bedtime., Disp: 30 capsule, Rfl: 3   Evolocumab (REPATHA SURECLICK) 140 MG/ML SOAJ, Inject 140 mg into the skin every 14 (fourteen) days. (Patient taking differently: Inject 140 mg into the skin every 21 ( twenty-one) days.), Disp: 2 mL, Rfl: 11   famotidine (PEPCID) 20 MG tablet, TAKE 1 TABLET BY MOUTH TWICE A DAY (Patient taking differently: Take 20 mg by mouth every evening.), Disp: 60 tablet, Rfl: 11   ipratropium-albuterol (DUONEB) 0.5-2.5 (3) MG/3ML SOLN, Take 3 mLs by nebulization every 6 (six) hours as needed. (Patient taking differently: Take 3 mLs by nebulization every 6 (six) hours as needed (for shortness of breazth or wheezing).), Disp: 120 mL, Rfl: 5   metFORMIN (GLUCOPHAGE) 500 MG tablet, Take  1 tablet (500 mg total) by mouth 2 (two) times daily with a meal., Disp: 60 tablet, Rfl: 2   nitroGLYCERIN (NITROSTAT) 0.4 MG SL tablet, Place 1 tablet (0.4 mg total) under the tongue every 5 (five) minutes as needed for chest pain., Disp: 25 tablet, Rfl: 11   pindolol (VISKEN) 5 MG tablet, Take 1 tablet (5 mg total) by mouth daily after breakfast., Disp: 30 tablet, Rfl: 3   risperiDONE (RISPERDAL) 0.5 MG tablet, Take 1 tablet (0.5 mg total) by mouth daily. Take 1 mg Risperdal tab at bedtime (Patient taking differently: Take 0.5 mg by mouth in the morning.), Disp: 60 tablet, Rfl: 2   risperiDONE (RISPERDAL) 1 MG tablet, Take 1 tablet (1 mg total) by mouth at bedtime., Disp: 30 tablet, Rfl: 2   Semaglutide,0.25 or 0.5MG /DOS, 2 MG/1.5ML SOPN, Inject 0.25 mg into the skin every Friday., Disp: , Rfl:    venlafaxine XR (EFFEXOR XR) 75 MG 24 hr capsule, Take 3 capsules (225 mg total) by mouth daily with breakfast., Disp: 90 capsule, Rfl: 1  Current Facility-Administered Medications:    [START ON 11/06/2023] denosumab (PROLIA) injection 60 mg, 60 mg, Subcutaneous, Once, Bradd Canary, MD  Facility-Administered Medications Ordered in Other Visits:    acetaminophen (TYLENOL) tablet 650 mg, 650 mg, Oral,  Once, Malachy Mood, MD   diphenhydrAMINE (BENADRYL) capsule 25 mg, 25 mg, Oral, Once, Malachy Mood, MD      Objective:   Vitals:   08/02/23 1312  BP: 124/72  Pulse: 100  Temp: 98.2 F (36.8 C)  TempSrc: Oral  SpO2: 100%  Weight: 167 lb (75.8 kg)  Height: 5\' 1"  (1.549 m)    Estimated body mass index is 31.55 kg/m as calculated from the following:   Height as of this encounter: 5\' 1"  (1.549 m).   Weight as of this encounter: 167 lb (75.8 kg).  @WEIGHTCHANGE @  [employer]   08/02/23 1312  Weight: 167 lb (75.8 kg)     Physical Exam   General: No distress. Looks wel O2 at rest: no Cane present: no Sitting in wheel chair: no Frail: no Obese: YES Neuro: Alert and Oriented x 3. GCS 15.  Speech normal Psych: Pleasant Resp:  Barrel Chest - no.  Wheeze - no, Crackles - no, No overt respiratory distress CVS: Normal heart sounds. Murmurs - no Ext: Stigmata of Connective Tissue Disease - no HEENT: Normal upper airway. PEERL +. No post nasal drip        Assessment:       ICD-10-CM   1. Other acute pulmonary embolism without acute cor pulmonale (HCC)  I26.99     2. Severe persistent asthma with intensive monitoring  J45.50     3. [place] as [place] of occurrence of external cause  Y92.240          Plan:     Patient Instructions  Other acute pulmonary embolism without acute cor pulmonale (HCC)  -Diagnosed 07/28/2023.  Small clot burden probably related to tamoxifen  Plan - Continue Eliquis at full dose for at least 6 months and then reassess with a D-dimer and decide low-dose protocol for another 6-12 months  -MONITOR  for bleeding - Do not do tamoxifen - To follow-up with hematology but we at pulmonary are happy to prescribe and monitor the Eliquis -Do simple walking desaturation test today for oxygen levels  -If shortness of breath does not get better please to call us or go to the ER  Severe persistent asthma with intensive monitoring  -Current stable  Plan - Continue current treatment as before  [place] of occurrence of external cause -jury duty summons  PLAN  - Given recent pulmonary embolism and also severe persistent asthma you are in absolutely no shape along with the physical limitations to do jury duty.  Follow-up - 3 months with nurse practitioner or sooner if needed; okay to see Sherrin [facility] Return in about 3 months (around 10/30/2023) for 15 min visit, with any of the APPS, with Dr Marchelle Gearing.    SIGNATURE    Dr. Kalman Shan, M.D., F.C.C.P,  Pulmonary and Critical Care Medicine Staff Physician, [facility] Director - Interstitial Lung Disease  Program  Pulmonary Fibrosis [facility] [place], [place], [address]  Pager: [phone], If no answer or between  15:00h - 7:00h: call [phone] Telephone: [phone]  1:33 PM 08/02/2023

## 2023-08-02 NOTE — Patient Instructions (Addendum)
Other acute pulmonary embolism without acute cor pulmonale (HCC)  -Diagnosed 07/28/2023.  Small clot burden probably related to tamoxifen  Plan - Continue Eliquis at full dose for at least 6 months and then reassess with a D-dimer and decide low-dose protocol for another 6-12 months  -MONITOR  for bleeding - Do not do tamoxifen - To follow-up with hematology but we at pulmonary are happy to prescribe and monitor the Eliquis -Do simple walking desaturation test today for oxygen levels  -If shortness of breath does not get better please to call us or go to the ER  Severe persistent asthma with intensive monitoring  -Current stable  Plan - Continue current treatment as before  [place] of occurrence of external cause -jury duty summons  PLAN  - Given recent pulmonary embolism and also severe persistent asthma you are in absolutely no shape along with the physical limitations to do jury duty.  Follow-up - 3 months with nurse practitioner or sooner if needed; okay to see [facility]

## 2023-08-06 NOTE — Assessment & Plan Note (Signed)
invasive ductal carcinoma, stage Ia, pT1aN0M0, ER+/PR+/HER2-, G1 --We discussed her imaging findings and the biopsy results in great details. -Giving the very early disease, she likely a candidate for lumpectomy. She is agreeable with that. She was seen by Dr. Carolynne Edouard today and likely will proceed with surgery soon.  Given her age and small primary tumor, it is reasonable to forego sentinel lymph node biopsy. -Given the small tumor, no Oncotype or adjuvant chemotherapy needed.  Given the favorable prognostic factors, this is likely low risk disease. -she underwent left breast lumpectomy on Nov 04, 2022, I reviewed her surgical path. -She completed adjuvant radiation on January 23, 2023. -she started adjuvant tamoxifen in 01/2023

## 2023-08-06 NOTE — Assessment & Plan Note (Signed)
-  She is on Prolia injection every 6 months, and vitamin D. -We discussed the impact of antiestrogen therapy on her bone density, she will repeat a bone density every 2 years

## 2023-08-06 NOTE — Assessment & Plan Note (Signed)
-  Iron deficiency anemia and GI bleeding in 2022. Unable to tolerate oral iron. Colonoscopy and endoscopy in 2021 and 2023 were norma  -developed anemia with low ferritin again in 06/2023, received iv venofer 300mg X3

## 2023-08-07 ENCOUNTER — Inpatient Hospital Stay: Payer: PPO | Admitting: Hematology

## 2023-08-07 ENCOUNTER — Inpatient Hospital Stay: Payer: PPO | Attending: Hematology

## 2023-08-07 ENCOUNTER — Encounter: Payer: Self-pay | Admitting: Hematology

## 2023-08-07 VITALS — BP 107/62 | HR 78 | Temp 98.0°F | Resp 17 | Wt 168.2 lb

## 2023-08-07 DIAGNOSIS — Z170 Estrogen receptor positive status [ER+]: Secondary | ICD-10-CM | POA: Diagnosis not present

## 2023-08-07 DIAGNOSIS — D500 Iron deficiency anemia secondary to blood loss (chronic): Secondary | ICD-10-CM

## 2023-08-07 DIAGNOSIS — Z86711 Personal history of pulmonary embolism: Secondary | ICD-10-CM | POA: Insufficient documentation

## 2023-08-07 DIAGNOSIS — M810 Age-related osteoporosis without current pathological fracture: Secondary | ICD-10-CM

## 2023-08-07 DIAGNOSIS — Z1732 Human epidermal growth factor receptor 2 negative status: Secondary | ICD-10-CM | POA: Diagnosis not present

## 2023-08-07 DIAGNOSIS — Z79899 Other long term (current) drug therapy: Secondary | ICD-10-CM | POA: Insufficient documentation

## 2023-08-07 DIAGNOSIS — C50512 Malignant neoplasm of lower-outer quadrant of left female breast: Secondary | ICD-10-CM

## 2023-08-07 DIAGNOSIS — D649 Anemia, unspecified: Secondary | ICD-10-CM

## 2023-08-07 DIAGNOSIS — Z1721 Progesterone receptor positive status: Secondary | ICD-10-CM | POA: Diagnosis not present

## 2023-08-07 LAB — CMP (CANCER CENTER ONLY)
ALT: 10 U/L (ref 0–44)
AST: 12 U/L — ABNORMAL LOW (ref 15–41)
Albumin: 3.7 g/dL (ref 3.5–5.0)
Alkaline Phosphatase: 55 U/L (ref 38–126)
Anion gap: 5 (ref 5–15)
BUN: 15 mg/dL (ref 8–23)
CO2: 29 mmol/L (ref 22–32)
Calcium: 8.7 mg/dL — ABNORMAL LOW (ref 8.9–10.3)
Chloride: 104 mmol/L (ref 98–111)
Creatinine: 0.63 mg/dL (ref 0.44–1.00)
GFR, Estimated: >60 mL/min (ref >60)
Glucose, Bld: 98 mg/dL (ref 70–99)
Potassium: 3.9 mmol/L (ref 3.5–5.1)
Sodium: 138 mmol/L (ref 135–145)
Total Bilirubin: 0.2 mg/dL (ref 0.0–1.2)
Total Protein: 5.9 g/dL — ABNORMAL LOW (ref 6.5–8.1)

## 2023-08-07 LAB — CBC WITH DIFFERENTIAL (CANCER CENTER ONLY)
Abs Immature Granulocytes: 0.03 10*3/uL (ref 0.00–0.07)
Basophils Absolute: 0 10*3/uL (ref 0.0–0.1)
Basophils Relative: 0 %
Eosinophils Absolute: 0 10*3/uL (ref 0.0–0.5)
Eosinophils Relative: 0 %
HCT: 31.1 % — ABNORMAL LOW (ref 36.0–46.0)
Hemoglobin: 9.6 g/dL — ABNORMAL LOW (ref 12.0–15.0)
Immature Granulocytes: 1 %
Lymphocytes Relative: 17 %
Lymphs Abs: 0.9 10*3/uL (ref 0.7–4.0)
MCH: 27.3 pg (ref 26.0–34.0)
MCHC: 30.9 g/dL (ref 30.0–36.0)
MCV: 88.4 fL (ref 80.0–100.0)
Monocytes Absolute: 0.4 10*3/uL (ref 0.1–1.0)
Monocytes Relative: 7 %
Neutro Abs: 3.9 10*3/uL (ref 1.7–7.7)
Neutrophils Relative %: 75 %
Platelet Count: 304 10*3/uL (ref 150–400)
RBC: 3.52 MIL/uL — ABNORMAL LOW (ref 3.87–5.11)
RDW: 18.7 % — ABNORMAL HIGH (ref 11.5–15.5)
WBC Count: 5.1 10*3/uL (ref 4.0–10.5)
nRBC: 0 % (ref 0.0–0.2)

## 2023-08-07 LAB — FERRITIN: Ferritin: 216 ng/mL (ref 11–307)

## 2023-08-07 MED ORDER — APIXABAN 5 MG PO TABS: 5.0000 mg | ORAL_TABLET | Freq: Two times a day (BID) | ORAL | 4 refills | Status: DC

## 2023-08-07 NOTE — Progress Notes (Signed)
[facility]   [phone] [phone]   Clinic Follow up Note   Patient Care Team: Chilton Greathouse, MD as PCP - General (Pulmonary Disease) Rollene Rotunda, MD as PCP - Cardiology (Cardiology) Haverstock, Elvin So, MD as Referring Physician (Dermatology) Kalman Shan, MD as Consulting Physician (Pulmonary Disease) Lars Masson, MD as Consulting Physician (Cardiology) Marcelle Overlie, MD as Consulting Physician (Obstetrics and Gynecology) Suzanna Obey, MD as Consulting Physician (Otolaryngology) Pyrtle, Carie Caddy, MD as Consulting Physician (Gastroenterology) Donnelly Angelica, RN as Oncology Nurse Navigator Pershing Proud, RN as Oncology Nurse Navigator Griselda Miner, MD as Consulting Physician (General Surgery) Malachy Mood, MD as Consulting Physician (Hematology) Antony Blackbird, MD as Consulting Physician (Radiation Oncology)  Date of Service:  08/07/2023  CHIEF COMPLAINT: f/u of breast cancer and PE  CURRENT THERAPY:  Eliquis 5 mg twice daily  Oncology History   Primary malignant neoplasm of lower-outer quadrant of breast, left (HCC) invasive ductal carcinoma, stage Ia, pT1aN0M0, ER+/PR+/HER2-, G1 --We discussed her imaging findings and the biopsy results in great details. -Giving the very early disease, she likely a candidate for lumpectomy. She is agreeable with that. She was seen by Dr. Carolynne Edouard today and likely will proceed with surgery soon.  Given her age and small primary tumor, it is reasonable to forego sentinel lymph node biopsy. -Given the small tumor, no Oncotype or adjuvant chemotherapy needed.  Given the favorable prognostic factors, this is likely low risk disease. -she underwent left breast lumpectomy on Nov 04, 2022, I reviewed her surgical path. -She completed adjuvant radiation on January 23, 2023. -she started adjuvant tamoxifen in 01/2023  Osteoporosis -She is on Prolia injection every 6 months, and vitamin D. -We  discussed the impact of antiestrogen therapy on her bone density, she will repeat a bone density every 2 years    Iron deficiency anemia due to chronic blood loss -Iron deficiency anemia and GI bleeding in 2022. Unable to tolerate oral iron. Colonoscopy and endoscopy in 2021 and 2023 were norma  -developed anemia with low ferritin again in 06/2023, received iv venofer 300mg X3   Assessment and Plan    Breast Cancer Early-stage, small breast cancer. Previously on tamoxifen, discontinued due to increased risk of thromboembolism. Considering anastrozole or exemestane, decision deferred until recovery from pulmonary embolism. Immediate treatment may not be necessary due to cancer's small size and early stage. - Reevaluate need for aromatase inhibitors in five months - Order mammogram for October - MRI scheduled for April  Pulmonary Embolism Recent multiple pulmonary emboli, likely secondary to tamoxifen. Hospitalized February 7-9. Currently on Eliquis. Symptoms of dyspnea improved but not fully resolved. Discussed risks of continued anticoagulation and need for reassessment with D-dimer after six months. - Continue Eliquis 5 mg twice daily for six months - Provide prescription for one month supply plus four refills - Reassess with D-dimer after six months - Follow up with pulmonologist in three months  Anemia Chronic anemia with recent hemoglobin improvement to 9.6 from 8.4 after three IV iron infusions. No active bleeding. History of diverticulitis may contribute to anemia. Need for close monitoring due to anticoagulation therapy. - Monitor blood counts every two months - Check iron levels today and provide additional IV iron if low - Schedule lab work in one month and follow-up visit in April  Edema Leg, ankle, and foot swelling likely related to tamoxifen. Symptoms improved since discontinuation. - Advise leg elevation when sitting - Recommend compression stockings  Osteoporosis and  Scoliosis  Severe osteoporosis and scoliosis with significant back pain. Scheduled to see endocrinologist for osteoporosis management. - Follow up with endocrinologist on February 19  General Health Maintenance Routine health maintenance discussed including mammogram and MRI scheduling. - Order mammogram for October - MRI scheduled for April  [facility] course including CT scan images reviewed and discussed with patient -I agree with anticoagulation for 6 months -Tamoxifen has been discontinued.  Will discuss further if she wants to try anastrozole or exemestane on next visit. -Lab in 4-5 weeks for anemia and again in 3 months -Lab and follow-up in 5 months     SUMMARY OF ONCOLOGIC HISTORY: Oncology History Overview Note   Cancer Staging  Primary malignant neoplasm of lower-outer quadrant of breast, left Staging form: Breast, AJCC 8th Edition - Clinical stage from 10/10/2022: cT1a, cN0, cM0, G1 - Unsigned Stage prefix: Initial diagnosis Method of lymph node assessment: Clinical Histologic grading system: 3 grade system     Primary malignant neoplasm of lower-outer quadrant of breast, left (HCC)  10/10/2022 Initial Diagnosis   Primary malignant neoplasm of lower-outer quadrant of breast, left   11/04/2022 Cancer Staging   Staging form: Breast, AJCC 8th Edition - Pathologic stage from 11/04/2022: Stage Unknown (pT1a, pNX, G1, ER+, PR+, HER2-) - Signed by Malachy Mood, MD on 01/12/2023 Histologic grading system: 3 grade system Residual tumor (R): R0 - None   11/04/2022 Surgery   LEFT BREAST LUMPECTOMY WITH RADIOACTIVE SEED LOCALIZATION   Surgeon:Toth, Lynetta Mare, MD    11/04/2022 Pathology Results    FINAL MICROSCOPIC DIAGNOSIS:  A. BREAST, LEFT, LUMPECTOMY:      Invasive ductal carcinoma, 0.3 cm, grade 1 Ductal carcinoma in situ:  Not identified Margins, invasive:  Negative           Closest, invasive:  Posterior margin Margins, DCIS:  NA           Closest, DCIS:   NA Lymphovascular invasion:  Not identified Prognostic markers: ER: 100%, Positive, strong staining intensity PR: 100%, Positive, strong staining intensity Her2: Negative, 0 Ki-67: 5% Other:  Sclerosing adenosis, microcysts, dilated mammary ducts      Biopsy sites and clips identified See oncology table  B. BREAST, LEFT LATERAL MARGIN, EXCISION:      Benign breast tissue with sclerosing adenosis and microcysts.      Negative for malignancy.  C. BREAST, LEFT ANTERIOR MARGIN, EXCISION:      Benign breast tissue with sclerosing adenosis, microcysts, apocrine metaplasia, dilated mammary ducts.      Negative for malignancy.       Discussed the use of AI scribe software for clinical note transcription with the patient, who gave verbal consent to proceed.  History of Present Illness   A 68 year old patient with a history of breast cancer and anemia presents for follow-up. The patient was recently hospitalized due to multiple blood clots in both lungs, which she believes were caused by tamoxifen, a medication she was taking for her breast cancer. She has since stopped taking tamoxifen and has been on Eliquis for the blood clots. She also has a history of severe asthma and has been experiencing shortness of breath, which she initially attributed to her asthma. However, she noted that her breathing difficulties were different from her usual asthma symptoms, as she was not wheezing and her airways were clear. The patient has also received three infusions for her anemia, but she is unsure of the effect of these infusions due to her other health issues. She also mentions  having osteoporosis and scoliosis, and has been experiencing back pain. She has noticed swelling in her legs, ankles, and feet, which she believes may have been a side effect of tamoxifen.         All other systems were reviewed with the patient and are negative.  MEDICAL HISTORY:  Past Medical History:  Diagnosis Date    Allergic bronchopulmonary aspergillosis (HCC) 2008   sees Dr Wenda Overland pulmonology   Anemia    iron deficiency, resolved   Anxiety    Asthma    Breast cancer (HCC)    CAD (coronary artery disease)    a. LHC 6/16:  oOM1 60, pRCA 25 >> med Rx b. cath 3/19 2nd OM with 95% stenosis s/p synergy DES & anomalous RCA   CAP (community acquired pneumonia) 2016; 06/07/2016   Chronic bronchitis (HCC)    Chronic lower back pain    Complication of anesthesia    "think I have a hard time waking up from it"   Depression    mild   Diverticulitis    Diverticulosis    GERD (gastroesophageal reflux disease)    H/O hiatal hernia    Headache    "weekly" (08/23/2017)   History of echocardiogram    Echo 6/16:  Mod LVH, EF 60-65%, no RWMA, Gr 1 DD, trivial MR, normal LA size.   History of radiation therapy    Left breast- 12/26/22-01/23/23-Dr. Antony Blackbird   Hyperglycemia 11/20/2015   Hyperlipidemia, mixed 09/11/2007   Qualifier: Diagnosis of  By: Janit Bern   Did not tolerate Lipitor, zocor, Lovastatin, Pravastatin, Livalo, Crestor even low dose    IBS (irritable bowel syndrome)    Maxillary sinusitis    Normal cardiac stress test 11/2011   No evidence of ischemia or infarct.   Calculated ejection fraction 72%.   Obesity    OSA (obstructive sleep apnea) 02/2012   has stopped using  cpap   Osteoarthritis    Osteoporosis    Personal history of radiation therapy    Pneumonia 11/2011   "before 2013 I hadn't had pneumonia since I was a child" (04/13/2012)   Pulmonary nodules    S/P angioplasty with stent 08/23/17 ostial 2nd OM with DES synnergy 08/24/2017   Schatzki's ring     SURGICAL HISTORY: Past Surgical History:  Procedure Laterality Date   ANTERIOR AND POSTERIOR REPAIR N/A 10/18/2022   Procedure: ANTERIOR (CYSTOCELE);  Surgeon: Marcelle Overlie, MD;  Location: [facility] OR;  Service: Gynecology;  Laterality: N/A;   APPENDECTOMY  1989   BREAST BIOPSY  11/03/2022   MM LT RADIOACTIVE  SEED LOC MAMMO GUIDE 11/03/2022 [facility] MAMMOGRAPHY   BREAST LUMPECTOMY WITH RADIOACTIVE SEED LOCALIZATION Left 11/04/2022   Procedure: LEFT BREAST LUMPECTOMY WITH RADIOACTIVE SEED LOCALIZATION;  Surgeon: Griselda Miner, MD;  Location: [facility];  Service: General;  Laterality: Left;   CARDIAC CATHETERIZATION N/A 11/25/2014   Procedure: Right/Left Heart Cath and Coronary Angiography;  Surgeon: Lyn Records, MD;  Location: [facility] INVASIVE CV LAB;  Service: Cardiovascular;  Laterality: N/A;   CESAREAN SECTION  1985   COLONOSCOPY  03/2022   CORONARY ANGIOPLASTY WITH STENT PLACEMENT  08/23/2017   CORONARY STENT INTERVENTION N/A 08/23/2017   Procedure: CORONARY STENT INTERVENTION;  Surgeon: Kathleene Hazel, MD;  Location: [facility] INVASIVE CV LAB;  Service: Cardiovascular;  Laterality: N/A;   HERNIA REPAIR  04/13/2012   VHR laparoscopic   LAPAROSCOPIC BILATERAL SALPINGO OOPHERECTOMY Bilateral 10/18/2022   Procedure: LAPAROTOMY WITH BILATERAL SALPINGO  OOPHORECTOMY;  Surgeon: Marcelle Overlie, MD;  Location: [facility] OR;  Service: Gynecology;  Laterality: Bilateral;   LEFT HEART CATH AND CORONARY ANGIOGRAPHY N/A 08/23/2017   Procedure: LEFT HEART CATH AND CORONARY ANGIOGRAPHY;  Surgeon: Kathleene Hazel, MD;  Location: [facility] INVASIVE CV LAB;  Service: Cardiovascular;  Laterality: N/A;   VAGINAL HYSTERECTOMY N/A 10/18/2022   Procedure: HYSTERECTOMY VAGINAL;  Surgeon: Marcelle Overlie, MD;  Location: [facility] OR;  Service: Gynecology;  Laterality: N/A;   VENTRAL HERNIA REPAIR  04/13/2012   Procedure: LAPAROSCOPIC VENTRAL HERNIA;  Surgeon: [name], MD;  Location: [facility] OR;  Service: General;  Laterality: N/A;  laparoscopic repair of incarcerated hernia    I have reviewed the social history and family history with the patient and they are unchanged from previous note.  ALLERGIES:  is allergic to latuda [lurasidone hcl], beclomethasone dipropionate, flexeril [cyclobenzaprine], mometasone  furo-formoterol fum, sulfonamide derivatives, statins, and fluticasone.  MEDICATIONS:  Current Outpatient Medications  Medication Sig Dispense Refill   apixaban (ELIQUIS) 5 MG TABS tablet Take 1 tablet (5 mg total) by mouth 2 (two) times daily. 60 tablet 4   acetaminophen (TYLENOL) 650 MG CR tablet Take 650-1,300 mg by mouth every 8 (eight) hours as needed for pain.     albuterol (VENTOLIN HFA) 108 (90 Base) MCG/ACT inhaler Inhale 1-2 puffs into the lungs every 6 (six) hours as needed. 8 g 2   apixaban (ELIQUIS) 5 MG TABS tablet Take 2 tablets (10 mg total) by mouth 2 (two) times daily for 6 days, THEN 1 tablet (5 mg total) 2 (two) times daily. 60 tablet 0   ascorbic acid (VITAMIN C) 500 MG tablet Take 500 mg by mouth every evening.     Benralizumab (FASENRA PEN) 30 MG/ML SOAJ Inject 1 mL (30 mg total) into the skin every 8 (eight) weeks. 1 mL 2   Calcium-Magnesium-Vitamin D (CALCIUM MAGNESIUM PO) Take 1 tablet by mouth at bedtime.     Cholecalciferol (VITAMIN D3 ULTRA STRENGTH) 125 MCG (5000 UT) capsule Take 5,000 Units by mouth every evening.     clonazePAM (KLONOPIN) 0.5 MG tablet Take 1 tablet (0.5 mg total) by mouth daily. (Patient taking differently: Take 0.5 mg by mouth in the morning.) 30 tablet 1   denosumab (PROLIA) 60 MG/ML SOSY injection Inject 60 mg into the skin every 6 (six) months.     doxepin (SINEQUAN) 25 MG capsule Take 1 capsule (25 mg total) by mouth at bedtime. 30 capsule 3   Evolocumab (REPATHA SURECLICK) 140 MG/ML SOAJ Inject 140 mg into the skin every 14 (fourteen) days. (Patient taking differently: Inject 140 mg into the skin every 21 ( twenty-one) days.) 2 mL 11   famotidine (PEPCID) 20 MG tablet TAKE 1 TABLET BY MOUTH TWICE A DAY (Patient taking differently: Take 20 mg by mouth every evening.) 60 tablet 11   ipratropium-albuterol (DUONEB) 0.5-2.5 (3) MG/3ML SOLN Take 3 mLs by nebulization every 6 (six) hours as needed. (Patient taking differently: Take 3 mLs by  nebulization every 6 (six) hours as needed (for shortness of breazth or wheezing).) 120 mL 5   metFORMIN (GLUCOPHAGE) 500 MG tablet Take 1 tablet (500 mg total) by mouth 2 (two) times daily with a meal. 60 tablet 2   nitroGLYCERIN (NITROSTAT) 0.4 MG SL tablet Place 1 tablet (0.4 mg total) under the tongue every 5 (five) minutes as needed for chest pain. 25 tablet 11   pindolol (VISKEN) 5 MG tablet Take 1 tablet (5 mg total) by mouth  daily after breakfast. 30 tablet 3   risperiDONE (RISPERDAL) 0.5 MG tablet Take 1 tablet (0.5 mg total) by mouth daily. Take 1 mg Risperdal tab at bedtime (Patient taking differently: Take 0.5 mg by mouth in the morning.) 60 tablet 2   risperiDONE (RISPERDAL) 1 MG tablet Take 1 tablet (1 mg total) by mouth at bedtime. 30 tablet 2   Semaglutide,0.25 or 0.5MG /DOS, 2 MG/1.5ML SOPN Inject 0.25 mg into the skin every Friday.     venlafaxine XR (EFFEXOR XR) 75 MG 24 hr capsule Take 3 capsules (225 mg total) by mouth daily with breakfast. 90 capsule 1   Current Facility-Administered Medications  Medication Dose Route Frequency Provider Last Rate Last Admin   [START ON 11/06/2023] denosumab (PROLIA) injection 60 mg  60 mg Subcutaneous Once Bradd Canary, MD        PHYSICAL EXAMINATION: ECOG PERFORMANCE STATUS: 1 - Symptomatic but completely ambulatory  Vitals:   08/07/23 1409  BP: 107/62  Pulse: 78  Resp: 17  Temp: 98 F (36.7 C)  SpO2: 100%   Wt Readings from Last 3 Encounters:  08/07/23 168 lb 3.2 oz (76.3 kg)  08/02/23 166 lb 9.6 oz (75.6 kg)  08/02/23 167 lb (75.8 kg)     GENERAL:alert, no distress and comfortable SKIN: skin color, texture, turgor are normal, no rashes or significant lesions EYES: normal, Conjunctiva are pink and non-injected, sclera clear NECK: supple, thyroid normal size, non-tender, without nodularity LYMPH:  no palpable lymphadenopathy in the cervical, axillary  LUNGS: clear to auscultation and percussion with normal breathing  effort HEART: regular rate & rhythm and no murmurs and no lower extremity edema ABDOMEN:abdomen soft, non-tender and normal bowel sounds Musculoskeletal:no cyanosis of digits and no clubbing, mild pitting edema on bilateral lower extremity NEURO: alert & oriented x 3 with fluent speech, no focal motor/sensory deficits   LABORATORY DATA:  I have reviewed the data as listed    Latest Ref Rng & Units 08/07/2023    1:49 PM 07/30/2023    5:54 AM 07/28/2023    9:12 PM  CBC  WBC 4.0 - 10.5 K/uL 5.1  3.8  5.2   Hemoglobin 12.0 - 15.0 g/dL 9.6  8.6  9.4   Hematocrit 36.0 - 46.0 % 31.1  29.1  30.5   Platelets 150 - 400 K/uL 304  312  330         Latest Ref Rng & Units 08/07/2023    1:49 PM 07/28/2023    9:12 PM 07/03/2023   11:00 AM  CMP  Glucose 70 - 99 mg/dL 98  161  96   BUN 8 - 23 mg/dL 15  13  13    Creatinine 0.44 - 1.00 mg/dL 0.96  0.45  4.09   Sodium 135 - 145 mmol/L 138  137  133   Potassium 3.5 - 5.1 mmol/L 3.9  3.6  4.6   Chloride 98 - 111 mmol/L 104  103  100   CO2 22 - 32 mmol/L 29  26  27    Calcium 8.9 - 10.3 mg/dL 8.7  8.8  8.9   Total Protein 6.5 - 8.1 g/dL 5.9   6.0   Total Bilirubin 0.0 - 1.2 mg/dL 0.2   0.2   Alkaline Phos 38 - 126 U/L 55   55   AST 15 - 41 U/L 12   17   ALT 0 - 44 U/L 10   13       RADIOGRAPHIC STUDIES: I have  personally reviewed the radiological images as listed and agreed with the findings in the report. No results found.    No orders of the defined types were placed in this encounter.  All questions were answered. The patient knows to call the clinic with any problems, questions or concerns. No barriers to learning was detected. The total time spent in the appointment was 30 minutes.     Malachy Mood, MD 08/07/2023

## 2023-08-08 ENCOUNTER — Encounter: Payer: Self-pay | Admitting: Hematology

## 2023-08-09 ENCOUNTER — Encounter: Payer: Self-pay | Admitting: Endocrinology

## 2023-08-09 ENCOUNTER — Telehealth (INDEPENDENT_AMBULATORY_CARE_PROVIDER_SITE_OTHER): Payer: PPO | Admitting: Endocrinology

## 2023-08-09 DIAGNOSIS — M810 Age-related osteoporosis without current pathological fracture: Secondary | ICD-10-CM | POA: Diagnosis not present

## 2023-08-09 DIAGNOSIS — E559 Vitamin D deficiency, unspecified: Secondary | ICD-10-CM | POA: Diagnosis not present

## 2023-08-09 NOTE — Progress Notes (Unsigned)
BH MD/PA/NP OP Progress Note  08/11/2023 11:46 AM Victoria [name]  MRN:  [id number]  Visit Diagnosis:    ICD-10-CM   1. Anxiety and depression  F41.9    F32.A     2. OSA (obstructive sleep apnea)  G47.33       Assessment: Victoria [name] is a 68 y.o. female with a history of anxiety, depression, OSA who presented to [facility] at [facility] for initial evaluation on 06/30/2022.  At initial evaluation patient reported symptoms of anxiety including feeling nervous or on edge, being unable to stop controlled worrying, worrying too much about different things, and fear that something awful would happen.  Patient's symptoms have been gradually progressing.  There was a period in November 2023 where she was having passive SI without intent or plan.  Patient also did endorse low mood, poor sleep, difficulty concentrating.  She does have a history of obstructive sleep apnea and does not use CPAP with last sleep study being over 10 years ago. Psychosocially patient has increased stress of caring for her adult son with cerebral palsy and planning the next steps for him after she and her husband pass or are no longer able to care for him.  Patient met criteria for MDD and generalized anxiety disorder. Of note on exam patient was found to have smacking of her lips repetitive movements of her mouth which are concerning for parkinsonism secondary to Zyprexa.     Victoria [name] presents for follow-up evaluation. Today, 08/11/23, patient reports that her mood has been stable while her anxiety has increased slightly in the interim.  She endorsed ongoing symptoms of restlessness that are manageable upon using coping skills.  This increase in anxiety appears to be secondary to situational stressors.  While there is increased anxiety secondary to this ultimately will be a long-term benefit as it is managing one of her more significant psychosocial stressors.  Patient had reached out in the interim  about discontinuing pindolol due to shortness of breath.  It was later found that she actually had pulmonary embolisms that were causing his symptoms and the medication was not contributing.  She restarted the pindolol a week ago which we discussed and were in agreement to continue that medication today.  We also reviewed the semaglutide which was recently started and recommended discontinuing metformin.  Risk and benefits of semaglutide were discussed especially effect on SSRI absorption.  It would be appropriate to continue on her current medication regimen with the exception of discontinuation of metformin.  We will follow up in 1 month.  Psychotherapeutic interventions were used during today's session. From 10:35 AM to 10:56 AM we used empathic listening techniques and provided support. Used supportive interviewing techniques to validate patients feelings. Worked on cognitive re framing techniques and focusing on behavioral activation.  Improvement was evidenced by patient's participation.  Plan: - Continue Effexor XR 225 mg QD - Continue Risperdal 0.5 mg Qam and 1 mg QHS  - Continue Doxepin 25 mg QHS - Continue Klonopin 0.5 mg QD prn for anxiety - Continue pindolol 5 mg every day - Discontinue metformin 500 mg BID - Started Semaglutide in the interim managed by an online provider - Recommend repeat sleep study, could consider dental devices OSA still present - CMP, CBC, lipid profile, Vit D, A1c, TSH reviewed - Continue with therapy [place] - Completed a 10 week wellness group through the [facility], followed by a group for special needs parents -  Discussed other support groups - Crisis resources reviewed - Follow up in a month  Chief Complaint:  Chief Complaint  Patient presents with   Follow-up   HPI: In the interim we discontinued the pindolol due to patient expressing concern about worsening shortness of breath.  On presentation today Victoria [name] reported that she is  doing pretty good. She had to be hospitalized in the interim after her communication due to the shortness of breath and they discovered she had a pulmonary embolism.  Since this is manage and she was started on Eliquis patient reports that shortness of breath has improved significantly.  Following discharge from the hospital patient had noticed that her anxiety was increased and she self restarted pindolol a week ago.  She denies any change in shortness of breath since restarting medication and has felt her anxiety improving some.  Victoria [name] reports that she is still having the rocking and restless in the morning, and still some at night. The minute she realizes that this is happening she is able to better control it.  However it can unconsciously restart and the motion bothers her.  That said she is not overly intrusive in her day-to-day and more just frustrating it keeps occurring.  Patient notes that this is only in present the last couple years and had not occurred prior to that.  They are getting ready for another big life change. They have found a place for [name] to live and he is going to move in on March 1st. They have dealing with the logistics of the move which has raised some stress. But overall she is looking forward to this as [name]'s care was one of the biggest areas for her anxiety. In addition to this it is only 10-15 minutes away from where they live now.   We discussed patient's current symptoms including the stable well-managed depression and mildly increased anxiety.  As the anxiety increase is more situational in nature and not debilitating we suggested attempting to continue on current medication regimen and managing symptoms through her coping skills.  As a major piece of her anxiety was her sons ongoing and long-term care we are hopeful that overall anxiety will improve once he is settled in the new location.  We did review how his absence in the home will be a significant change that she will  have to find things to occupy her time with now that she will not have to be caring for him as much.  Also provided support around all the ongoing changes.  If anxiety symptoms do improve in the future we could consider working to simplify her medication regimen.  Also of note patient did start semaglutide in the interim and discontinued the metformin upon our recommendation.  So far she denies any adverse side effects from the medication or notable change in the Effexor responsiveness.  We did review how these medications can affect absorption of SNRI medications.  Past Psychiatric History: One  prior psychiatric hospitalization to [facility] in February of 2024 due to increased depression/anxiety. No prior suicide attempts.  Patient did endorse passive SI in November of 2023 without any intent or plan.  She denies being connected with a psychiatrist in the past and had recently connected with a therapist at [facility] therapy.  Has taken Effexor (possible restlessness at 225), Cymbalta, Remeron, Ativan, Xanax, Klonopin, gabapentin, BuSpar, Zyprexa (TD), Latuda (akathisia), Risperdal, Doxepin,   Denies substance use other than 1 beer a day. Can increase to 2 when the weather  is nice.  Past Medical History:  Past Medical History:  Diagnosis Date   Allergic bronchopulmonary aspergillosis (HCC) 2008   sees Dr Wenda Overland pulmonology   Anemia    iron deficiency, resolved   Anxiety    Asthma    Breast cancer (HCC)    CAD (coronary artery disease)    a. LHC 6/16:  oOM1 60, pRCA 25 >> med Rx b. cath 3/19 2nd OM with 95% stenosis s/p synergy DES & anomalous RCA   CAP (community acquired pneumonia) 2016; 06/07/2016   Chronic bronchitis (HCC)    Chronic lower back pain    Complication of anesthesia    "think I have a hard time waking up from it"   Depression    mild   Diverticulitis    Diverticulosis    GERD (gastroesophageal reflux disease)    H/O hiatal hernia    Headache    "weekly"  (08/23/2017)   History of echocardiogram    Echo 6/16:  Mod LVH, EF 60-65%, no RWMA, Gr 1 DD, trivial MR, normal LA size.   History of radiation therapy    Left breast- 12/26/22-01/23/23-Dr. Antony Blackbird   Hyperglycemia 11/20/2015   Hyperlipidemia, mixed 09/11/2007   Qualifier: Diagnosis of  By: Janit Bern   Did not tolerate Lipitor, zocor, Lovastatin, Pravastatin, Livalo, Crestor even low dose    IBS (irritable bowel syndrome)    Maxillary sinusitis    Normal cardiac stress test 11/2011   No evidence of ischemia or infarct.   Calculated ejection fraction 72%.   Obesity    OSA (obstructive sleep apnea) 02/2012   has stopped using  cpap   Osteoarthritis    Osteoporosis    Personal history of radiation therapy    Pneumonia 11/2011   "before 2013 I hadn't had pneumonia since I was a child" (04/13/2012)   Pulmonary nodules    S/P angioplasty with stent 08/23/17 ostial 2nd OM with DES synnergy 08/24/2017   Schatzki's ring     Past Surgical History:  Procedure Laterality Date   ANTERIOR AND POSTERIOR REPAIR N/A 10/18/2022   Procedure: ANTERIOR (CYSTOCELE);  Surgeon: Marcelle Overlie, MD;  Location: [facility] OR;  Service: Gynecology;  Laterality: N/A;   APPENDECTOMY  1989   BREAST BIOPSY  11/03/2022   MM LT RADIOACTIVE SEED LOC MAMMO GUIDE 11/03/2022 [facility] MAMMOGRAPHY   BREAST LUMPECTOMY WITH RADIOACTIVE SEED LOCALIZATION Left 11/04/2022   Procedure: LEFT BREAST LUMPECTOMY WITH RADIOACTIVE SEED LOCALIZATION;  Surgeon: Griselda Miner, MD;  Location: [facility];  Service: General;  Laterality: Left;   CARDIAC CATHETERIZATION N/A 11/25/2014   Procedure: Right/Left Heart Cath and Coronary Angiography;  Surgeon: Lyn Records, MD;  Location: [facility] INVASIVE CV LAB;  Service: Cardiovascular;  Laterality: N/A;   CESAREAN SECTION  1985   COLONOSCOPY  03/2022   CORONARY ANGIOPLASTY WITH STENT PLACEMENT  08/23/2017   CORONARY STENT INTERVENTION N/A 08/23/2017   Procedure: CORONARY STENT  INTERVENTION;  Surgeon: Kathleene Hazel, MD;  Location: [facility] INVASIVE CV LAB;  Service: Cardiovascular;  Laterality: N/A;   HERNIA REPAIR  04/13/2012   VHR laparoscopic   LAPAROSCOPIC BILATERAL SALPINGO OOPHERECTOMY Bilateral 10/18/2022   Procedure: LAPAROTOMY WITH BILATERAL SALPINGO OOPHORECTOMY;  Surgeon: Marcelle Overlie, MD;  Location: [facility] OR;  Service: Gynecology;  Laterality: Bilateral;   LEFT HEART CATH AND CORONARY ANGIOGRAPHY N/A 08/23/2017   Procedure: LEFT HEART CATH AND CORONARY ANGIOGRAPHY;  Surgeon: Kathleene Hazel, MD;  Location: [facility] INVASIVE CV LAB;  Service:  Cardiovascular;  Laterality: N/A;   VAGINAL HYSTERECTOMY N/A 10/18/2022   Procedure: HYSTERECTOMY VAGINAL;  Surgeon: Marcelle Overlie, MD;  Location: [facility] OR;  Service: Gynecology;  Laterality: N/A;   VENTRAL HERNIA REPAIR  04/13/2012   Procedure: LAPAROSCOPIC VENTRAL HERNIA;  Surgeon: [name], MD;  Location: [facility] OR;  Service: General;  Laterality: N/A;  laparoscopic repair of incarcerated hernia   Family History:  Family History  Problem Relation Age of Onset   Breast cancer Mother 74       bilateral breast cancer dx. 41, met to liver   Hypertension Mother    Diabetes Mother    Diverticulosis Father    Prostate cancer Father    Breast cancer Sister 24       DCIS at 11, IDC at 25, PALB2+   Cancer Sister        breast cancer, invasive ductal carcinoma in 2022,DCIS at 11 with 4 weeks of radiation, 5 years of Tamoxifen    Pulmonary embolism Brother        recurrent   Heart attack Maternal Grandfather    Ovarian cancer Paternal Grandmother    Cerebral palsy Son    Prostate cancer Paternal Uncle    Breast cancer Niece 77       PALB2+   Osteoporosis Niece    Stroke Neg Hx    Colon cancer Neg Hx    Esophageal cancer Neg Hx    Stomach cancer Neg Hx    Rectal cancer Neg Hx     Social History:  Social History   Socioeconomic History   Marital status: Married    Spouse name: Not on file    Number of children: 1   Years of education: 14   Highest education level: Associate degree: academic program  Occupational History   Occupation: Disabled   Tobacco Use   Smoking status: Never    Passive exposure: Never   Smokeless tobacco: Never  Vaping Use   Vaping status: Never Used  Substance and Sexual Activity   Alcohol use: Yes    Alcohol/week: 4.0 - 7.0 standard drinks of alcohol    Types: 4 - 7 Standard drinks or equivalent per week   Drug use: No   Sexual activity: Yes    Birth control/protection: Surgical    Comment: gluten free, lives with husband and son with CP quadriplegia  Other Topics Concern   Not on file  Social History Narrative   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    Lives at home with her husband and son.   Right-handed.   2 cups caffeine per day.   One story home   Social Drivers of Health   Financial Resource Strain: Low Risk  (01/18/2023)   Overall Financial Resource Strain (CARDIA)    Difficulty of Paying Living Expenses: Not hard at all  Food Insecurity: No Food Insecurity (07/30/2023)   Hunger Vital Sign    Worried About Running Out of Food in the Last Year: Never true    Ran Out of Food in the Last Year: Never true  Transportation Needs: No Transportation Needs (07/30/2023)   PRAPARE - Administrator, Civil Service (Medical): No    Lack of Transportation (Non-Medical): No  Physical Activity: Inactive (02/10/2023)   Exercise Vital Sign    Days of Exercise per Week: 0 days    Minutes of Exercise per Session: 0 min  Stress: Stress Concern Present (01/18/2023)   [employer] of Occupational Health -  Occupational Stress Questionnaire    Feeling of Stress : To some extent  Social Connections: Socially Integrated (07/29/2023)   Social Connection and Isolation Panel [NHANES]    Frequency of Communication with Friends and Family: More than three times a week    Frequency of Social Gatherings with Friends and Family: Once a week    Attends  Religious Services: More than 4 times per year    Active Member of [employer] or Organizations: Yes    Attends Engineer, structural: More than 4 times per year    Marital Status: Married    Allergies:  Allergies  Allergen Reactions   Latuda [Lurasidone Hcl] Other (See Comments)    PER THE PT, CAUSED RESTLESSNESS   Beclomethasone Dipropionate Hives and Other (See Comments)    Weight gain, also   Flexeril [Cyclobenzaprine] Anxiety   Mometasone Furo-Formoterol Fum Hives and Other (See Comments)    Weight gain, also   Sulfonamide Derivatives Hives and Rash   Statins Other (See Comments)    Myalgias, RLS   Fluticasone Rash and Other (See Comments)    "Weight gain," too    Current Medications: Current Outpatient Medications  Medication Sig Dispense Refill   acetaminophen (TYLENOL) 650 MG CR tablet Take 650-1,300 mg by mouth every 8 (eight) hours as needed for pain.     albuterol (VENTOLIN HFA) 108 (90 Base) MCG/ACT inhaler Inhale 1-2 puffs into the lungs every 6 (six) hours as needed. 8 g 2   apixaban (ELIQUIS) 5 MG TABS tablet Take 2 tablets (10 mg total) by mouth 2 (two) times daily for 6 days, THEN 1 tablet (5 mg total) 2 (two) times daily. 60 tablet 0   apixaban (ELIQUIS) 5 MG TABS tablet Take 1 tablet (5 mg total) by mouth 2 (two) times daily. 60 tablet 4   ascorbic acid (VITAMIN C) 500 MG tablet Take 500 mg by mouth every evening.     Benralizumab (FASENRA PEN) 30 MG/ML SOAJ Inject 1 mL (30 mg total) into the skin every 8 (eight) weeks. 1 mL 2   Calcium-Magnesium-Vitamin D (CALCIUM MAGNESIUM PO) Take 1 tablet by mouth at bedtime.     Cholecalciferol (VITAMIN D3 ULTRA STRENGTH) 125 MCG (5000 UT) capsule Take 5,000 Units by mouth every evening.     clonazePAM (KLONOPIN) 0.5 MG tablet Take 1 tablet (0.5 mg total) by mouth daily. (Patient taking differently: Take 0.5 mg by mouth in the morning.) 30 tablet 1   denosumab (PROLIA) 60 MG/ML SOSY injection Inject 60 mg into the skin  every 6 (six) months.     doxepin (SINEQUAN) 25 MG capsule Take 1 capsule (25 mg total) by mouth at bedtime. 30 capsule 3   Evolocumab (REPATHA SURECLICK) 140 MG/ML SOAJ Inject 140 mg into the skin every 14 (fourteen) days. (Patient taking differently: Inject 140 mg into the skin every 21 ( twenty-one) days.) 2 mL 11   famotidine (PEPCID) 20 MG tablet TAKE 1 TABLET BY MOUTH TWICE A DAY (Patient taking differently: Take 20 mg by mouth every evening.) 60 tablet 11   ipratropium-albuterol (DUONEB) 0.5-2.5 (3) MG/3ML SOLN Take 3 mLs by nebulization every 6 (six) hours as needed. (Patient taking differently: Take 3 mLs by nebulization every 6 (six) hours as needed (for shortness of breazth or wheezing).) 120 mL 5   nitroGLYCERIN (NITROSTAT) 0.4 MG SL tablet Place 1 tablet (0.4 mg total) under the tongue every 5 (five) minutes as needed for chest pain. 25 tablet 11   pindolol (  VISKEN) 5 MG tablet Take 1 tablet (5 mg total) by mouth daily after breakfast. 30 tablet 3   risperiDONE (RISPERDAL) 0.5 MG tablet Take 1 tablet (0.5 mg total) by mouth daily. Take 1 mg Risperdal tab at bedtime (Patient taking differently: Take 0.5 mg by mouth in the morning.) 60 tablet 2   risperiDONE (RISPERDAL) 1 MG tablet Take 1 tablet (1 mg total) by mouth at bedtime. 30 tablet 2   Semaglutide,0.25 or 0.5MG /DOS, 2 MG/1.5ML SOPN Inject 0.25 mg into the skin every Friday.     venlafaxine XR (EFFEXOR XR) 75 MG 24 hr capsule Take 3 capsules (225 mg total) by mouth daily with breakfast. 90 capsule 1   Current Facility-Administered Medications  Medication Dose Route Frequency Provider Last Rate Last Admin   [START ON 11/06/2023] denosumab (PROLIA) injection 60 mg  60 mg Subcutaneous Once Bradd Canary, MD         Psychiatric Specialty Exam: Review of Systems  There were no vitals taken for this visit.There is no height or weight on file to calculate BMI.  General Appearance: Well Groomed  Eye Contact:  Good  Speech:  Clear and  Coherent and Normal Rate  Volume:  Normal  Mood:  Anxious and Euthymic  Affect:  Appropriate  Thought Process:  Coherent  Orientation:  Full (Time, Place, and Person)  Thought Content: Logical   Suicidal Thoughts:  No  Homicidal Thoughts:  No  Memory:  Immediate;   Good  Judgement:  Fair  Insight:  Fair  Psychomotor Activity:  Normal  Concentration:  Concentration: Good  Recall:  Good  Fund of Knowledge: Fair  Language: Good  Akathisia:  No    AIMS (if indicated): not done  Assets:  Communication Skills Desire for Improvement Financial Resources/Insurance Housing Transportation  ADL's:  Intact  Cognition: WNL  Sleep:  Good   Metabolic Disorder Labs: Lab Results  Component Value Date   HGBA1C 5.3 04/25/2023   MPG 117 10/22/2008   Lab Results  Component Value Date   PROLACTIN 15.4 08/12/2022   Lab Results  Component Value Date   CHOL 126 04/25/2023   TRIG 125.0 04/25/2023   HDL 63.20 04/25/2023   CHOLHDL 2 04/25/2023   VLDL 25.0 04/25/2023   LDLCALC 38 04/25/2023   LDLCALC 111 (H) 01/19/2023   Lab Results  Component Value Date   TSH 1.79 04/25/2023   TSH 1.76 01/19/2023    Therapeutic Level Labs: No results found for: "LITHIUM" No results found for: "VALPROATE" No results found for: "CBMZ"   Screenings: AIMS    Flowsheet Row Admission (Discharged) from 08/12/2022 in [facility] BEHAVIORAL MEDICINE  AIMS Total Score 2      AUDIT    Flowsheet Row Admission (Discharged) from 08/12/2022 in [facility] BEHAVIORAL MEDICINE  Alcohol Use Disorder Identification Test Final Score (AUDIT) 0      GAD-7    Flowsheet Row Video Visit from 10/13/2022 in [facility] Primary Care at [facility] Office Visit from 06/30/2022 in [facility] Office Visit from 05/26/2022 in [facility] Primary Care at [facility] Office Visit from 01/16/2017 in [facility] Primary Care at [name]  Total  GAD-7 Score 0 11 11 3       PHQ2-9    Flowsheet Row Clinical Support from 07/12/2023 in [facility] Clinical Support from 02/10/2023 in [facility] Primary Care at [facility] Office Visit from  01/19/2023 in [facility] Primary Care at [facility] Video Visit from 10/13/2022 in [facility] Primary Care at [facility] Telephone from 08/19/2022 in [facility] Community Care Coordination  PHQ-2 Total Score 0 0 0 0 2  PHQ-9 Total Score -- -- -- 0 --      Flowsheet Row ED to Hosp-Admission (Discharged) from 07/28/2023 in [facility]LO[facility] UROLOGY Admission (Discharged) from 11/04/2022 in [facility] Admission (Discharged) from 10/18/2022 in [facility]  C-SSRS RISK CATEGORY No Risk No Risk No Risk       Collaboration of Care: Collaboration of Care: Medication Management AEB medication prescription, Primary Care Provider AEB chart review, and Other provider involved in patient's care AEB ED, hospital, pulmonology, and oncology chart review  Patient/Guardian was advised Release of Information must be obtained prior to any record release in order to collaborate their care with an outside provider. Patient/Guardian was advised if they have not already done so to contact the registration department to sign all necessary forms in order for [name] to release information regarding their care.   Consent: Patient/Guardian gives verbal consent for treatment and assignment of benefits for services provided during this visit. Patient/Guardian expressed understanding and agreed to proceed.    Stasia Cavalier, MD 08/11/2023, 11:46 AM   Virtual Visit via Video Note  I connected with Victoria [name] on 08/11/23 at 10:30 AM EST by a video enabled telemedicine application and verified that I am speaking with the correct person using two identifiers.  Location: Patient: Home Provider: Home  Office   I discussed the limitations of evaluation and management by telemedicine and the availability of in person appointments. The patient expressed understanding and agreed to proceed.   I discussed the assessment and treatment plan with the patient. The patient was provided an opportunity to ask questions and all were answered. The patient agreed with the plan and demonstrated an understanding of the instructions.   The patient was advised to call back or seek an in-person evaluation if the symptoms worsen or if the condition fails to improve as anticipated.  I provided 20 minutes of non-face-to-face time during this encounter.   Stasia Cavalier, MD

## 2023-08-09 NOTE — Progress Notes (Unsigned)
Outpatient Endocrinology Note Victoria Alexes Menchaca, MD   Patient's Name: Victoria [name]    DOB: 1955-09-01    MRN: [id number]  I connected with  Victoria [name] on 08/09/23 by a video enabled telemedicine application and verified that I am speaking with the correct person using two identifiers.   I discussed the limitations of evaluation and management by telemedicine. The patient expressed understanding and agreed to proceed.   Provider location : home  Patient location: home  Reason for virtual visit : weather / winter storm / office closed.  REASON OF VISIT: New consult for osteoporosis  REFERRING PROVIDER: Bradd Canary, MD  PCP: Chilton Greathouse, MD  HISTORY OF PRESENT ILLNESS:   Victoria [name] is a 68 y.o. old female with past medical history listed below, is here for new consult of bone health issues / osteoporosis.  Pertinent Bone Health History: Patient has longstanding history of osteoporosis, patient reports was diagnosed around the age of 50, ?  steroid-induced.  She was requiring frequent systemic steroid for asthma.  She had taken Fosamax / alendronate for several years until 2022, based on chart review Fosamax was started at least in 2008, she had some years of discontinuation of Fosamax however she had been on it for more than 10 years prior to switching to Prolia injection in May 2022.  Patient had improvement on the bone mineral density in the past when on Fosamax.  Patient also had improvement on bone density after being on Prolia from 2022.  Patient has scoliosis, following with orthopedic surgeon at [facility] and considering spine surgery who recommended anabolic therapy for osteoporosis, no records available to review and patient is referred to endocrinology for further evaluation and management.  Bone Health Concerns:   Fracture history: Denies.  She has a scoliosis.  Osteoporosis medications: Fosamax 10+ years, Prolia started in May 2022, every 8-month last injection in  December 2024.  Secondary workup for osteoporosis: No details record of secondary laboratory work available to review.  Osteoporosis was thought to be related to frequent systemic steroid use. Patient has required frequent steroid for asthma for several years.  She has also been requiring injectable steroids for back, hip and knee frequently.  No history of kidney stone.  No malabsorption, liver or thyroid disorder.  No smoking or excessive alcohol history.  No frequent falls.  She had menopause around the age of 53s.  Calcium intake from supplements: 500 mg two times a day.  Dietary calcium intake: milk every day, yogurt, cheese Current vitamin D intake: 5000 international units daily.  Relevant comorbidities: History of breast cancer diagnosed in April 2024, status postlumpectomy and external beam radiation. No history of diabetes mellitus.  No Rheumatoid arthritis. No history of GERD. Yes No history of recent invasive dental procedures. Not planning dental procedures in the near future.  Imagings: DEXA scan in September 2024 completed at [facility]  T-score left femoral neck -1.1, right femoral neck -1.5, one third forearm left -2.8 , overall improvement on the bone mineral density.  DXA scan in September 2022 T-score at left femoral neck -1.5, right femoral neck -2.3, forearm -2.6.  She had multiple DEXA scan since 2004.  In the recent DEXA scan, bone mineral density and T-score not measured at the lumbar spine probably related to scoliosis.  In 2013 T-score at lumbar spine was -0.7, normal.  Interval history  In terms of her bone health, she {HAS/HAS NOT:9025} done well since last visit. Victoria [name] {  ADMITS:10184} having had any falls. ***  She  {HAS/HAS NOT:9025} suffered any fractures, or other bone issues.   Victoria [name] reports {GOOD FAIR POOR:22704} compliance with medications. In terms of her calcium intake, she reports taking {0-9:30091} dairy exchanges a day through  her diet. Regarding calcium pills, she reports {0-9:30091} tablets daily. Multivitamin: {0-9:30091} daily. Vitamin D: {0-9:30091} daily  REVIEW OF SYSTEMS:  As per history of present illness.   PAST MEDICAL HISTORY: Past Medical History:  Diagnosis Date   Allergic bronchopulmonary aspergillosis (HCC) 2008   sees Dr Wenda Overland pulmonology   Anemia    iron deficiency, resolved   Anxiety    Asthma    Breast cancer (HCC)    CAD (coronary artery disease)    a. LHC 6/16:  oOM1 60, pRCA 25 >> med Rx b. cath 3/19 2nd OM with 95% stenosis s/p synergy DES & anomalous RCA   CAP (community acquired pneumonia) 2016; 06/07/2016   Chronic bronchitis (HCC)    Chronic lower back pain    Complication of anesthesia    "think I have a hard time waking up from it"   Depression    mild   Diverticulitis    Diverticulosis    GERD (gastroesophageal reflux disease)    H/O hiatal hernia    Headache    "weekly" (08/23/2017)   History of echocardiogram    Echo 6/16:  Mod LVH, EF 60-65%, no RWMA, Gr 1 DD, trivial MR, normal LA size.   History of radiation therapy    Left breast- 12/26/22-01/23/23-Dr. Antony Blackbird   Hyperglycemia 11/20/2015   Hyperlipidemia, mixed 09/11/2007   Qualifier: Diagnosis of  By: Janit Bern   Did not tolerate Lipitor, zocor, Lovastatin, Pravastatin, Livalo, Crestor even low dose    IBS (irritable bowel syndrome)    Maxillary sinusitis    Normal cardiac stress test 11/2011   No evidence of ischemia or infarct.   Calculated ejection fraction 72%.   Obesity    OSA (obstructive sleep apnea) 02/2012   has stopped using  cpap   Osteoarthritis    Osteoporosis    Personal history of radiation therapy    Pneumonia 11/2011   "before 2013 I hadn't had pneumonia since I was a child" (04/13/2012)   Pulmonary nodules    S/P angioplasty with stent 08/23/17 ostial 2nd OM with DES synnergy 08/24/2017   Schatzki's ring     PAST SURGICAL HISTORY: Past Surgical History:   Procedure Laterality Date   ANTERIOR AND POSTERIOR REPAIR N/A 10/18/2022   Procedure: ANTERIOR (CYSTOCELE);  Surgeon: Marcelle Overlie, MD;  Location: [facility] OR;  Service: Gynecology;  Laterality: N/A;   APPENDECTOMY  1989   BREAST BIOPSY  11/03/2022   MM LT RADIOACTIVE SEED LOC MAMMO GUIDE 11/03/2022 [facility] MAMMOGRAPHY   BREAST LUMPECTOMY WITH RADIOACTIVE SEED LOCALIZATION Left 11/04/2022   Procedure: LEFT BREAST LUMPECTOMY WITH RADIOACTIVE SEED LOCALIZATION;  Surgeon: Griselda Miner, MD;  Location:  SURGERY CENTER;  Service: General;  Laterality: Left;   CARDIAC CATHETERIZATION N/A 11/25/2014   Procedure: Right/Left Heart Cath and Coronary Angiography;  Surgeon: Lyn Records, MD;  Location: [facility] INVASIVE CV LAB;  Service: Cardiovascular;  Laterality: N/A;   CESAREAN SECTION  1985   COLONOSCOPY  03/2022   CORONARY ANGIOPLASTY WITH STENT PLACEMENT  08/23/2017   CORONARY STENT INTERVENTION N/A 08/23/2017   Procedure: CORONARY STENT INTERVENTION;  Surgeon: Kathleene Hazel, MD;  Location: [facility] INVASIVE CV LAB;  Service: Cardiovascular;  Laterality: N/A;  HERNIA REPAIR  04/13/2012   VHR laparoscopic   LAPAROSCOPIC BILATERAL SALPINGO OOPHERECTOMY Bilateral 10/18/2022   Procedure: LAPAROTOMY WITH BILATERAL SALPINGO OOPHORECTOMY;  Surgeon: Marcelle Overlie, MD;  Location: [facility] OR;  Service: Gynecology;  Laterality: Bilateral;   LEFT HEART CATH AND CORONARY ANGIOGRAPHY N/A 08/23/2017   Procedure: LEFT HEART CATH AND CORONARY ANGIOGRAPHY;  Surgeon: Kathleene Hazel, MD;  Location: [facility] INVASIVE CV LAB;  Service: Cardiovascular;  Laterality: N/A;   VAGINAL HYSTERECTOMY N/A 10/18/2022   Procedure: HYSTERECTOMY VAGINAL;  Surgeon: Marcelle Overlie, MD;  Location: [facility] OR;  Service: Gynecology;  Laterality: N/A;   VENTRAL HERNIA REPAIR  04/13/2012   Procedure: LAPAROSCOPIC VENTRAL HERNIA;  Surgeon: [name], MD;  Location: [facility] OR;  Service: General;  Laterality: N/A;  laparoscopic repair of  incarcerated hernia    ALLERGIES: Allergies  Allergen Reactions   Latuda [Lurasidone Hcl] Other (See Comments)    PER THE PT, CAUSED RESTLESSNESS   Beclomethasone Dipropionate Hives and Other (See Comments)    Weight gain, also   Flexeril [Cyclobenzaprine] Anxiety   Mometasone Furo-Formoterol Fum Hives and Other (See Comments)    Weight gain, also   Sulfonamide Derivatives Hives and Rash   Statins Other (See Comments)    Myalgias, RLS   Fluticasone Rash and Other (See Comments)    "Weight gain," too    FAMILY HISTORY:  Family History  Problem Relation Age of Onset   Breast cancer Mother 55       bilateral breast cancer dx. 54, met to liver   Hypertension Mother    Diabetes Mother    Diverticulosis Father    Prostate cancer Father    Breast cancer Sister 69       DCIS at 77, IDC at 76, PALB2+   Cancer Sister        breast cancer, invasive ductal carcinoma in 2022,DCIS at 48 with 4 weeks of radiation, 5 years of Tamoxifen    Pulmonary embolism Brother        recurrent   Heart attack Maternal Grandfather    Ovarian cancer Paternal Grandmother    Cerebral palsy Son    Prostate cancer Paternal Uncle    Breast cancer Niece 46       PALB2+   Osteoporosis Niece    Stroke Neg Hx    Colon cancer Neg Hx    Esophageal cancer Neg Hx    Stomach cancer Neg Hx    Rectal cancer Neg Hx     SOCIAL HISTORY: Social History   Socioeconomic History   Marital status: Married    Spouse name: Not on file   Number of children: 1   Years of education: 14   Highest education level: Associate degree: academic program  Occupational History   Occupation: Disabled   Tobacco Use   Smoking status: Never    Passive exposure: Never   Smokeless tobacco: Never  Vaping Use   Vaping status: Never Used  Substance and Sexual Activity   Alcohol use: Yes    Alcohol/week: 4.0 - 7.0 standard drinks of alcohol    Types: 4 - 7 Standard drinks or equivalent per week   Drug use: No   Sexual  activity: Yes    Birth control/protection: Surgical    Comment: gluten free, lives with husband and son with CP quadriplegia  Other Topics Concern   Not on file  Social History Narrative   Cares for son with cerebral palsy.    Lives at home with  her husband and son.   Right-handed.   2 cups caffeine per day.   One story home   Social Drivers of Health   Financial Resource Strain: Low Risk  (01/18/2023)   Overall Financial Resource Strain (CARDIA)    Difficulty of Paying Living Expenses: Not hard at all  Food Insecurity: No Food Insecurity (07/30/2023)   Hunger Vital Sign    Worried About Running Out of Food in the Last Year: Never true    Ran Out of Food in the Last Year: Never true  Transportation Needs: No Transportation Needs (07/30/2023)   PRAPARE - Administrator, Civil Service (Medical): No    Lack of Transportation (Non-Medical): No  Physical Activity: Inactive (02/10/2023)   Exercise Vital Sign    Days of Exercise per Week: 0 days    Minutes of Exercise per Session: 0 min  Stress: Stress Concern Present (01/18/2023)   [employer] of Occupational Health - Occupational Stress Questionnaire    Feeling of Stress : To some extent  Social Connections: Socially Integrated (07/29/2023)   Social Connection and Isolation Panel [NHANES]    Frequency of Communication with Friends and Family: More than three times a week    Frequency of Social Gatherings with Friends and Family: Once a week    Attends Religious Services: More than 4 times per year    Active Member of [employer] or Organizations: Yes    Attends Engineer, structural: More than 4 times per year    Marital Status: Married    MEDICATIONS:  Current Outpatient Medications  Medication Sig Dispense Refill   acetaminophen (TYLENOL) 650 MG CR tablet Take 650-1,300 mg by mouth every 8 (eight) hours as needed for pain.     albuterol (VENTOLIN HFA) 108 (90 Base) MCG/ACT inhaler Inhale 1-2 puffs into the lungs  every 6 (six) hours as needed. 8 g 2   apixaban (ELIQUIS) 5 MG TABS tablet Take 2 tablets (10 mg total) by mouth 2 (two) times daily for 6 days, THEN 1 tablet (5 mg total) 2 (two) times daily. 60 tablet 0   apixaban (ELIQUIS) 5 MG TABS tablet Take 1 tablet (5 mg total) by mouth 2 (two) times daily. 60 tablet 4   ascorbic acid (VITAMIN C) 500 MG tablet Take 500 mg by mouth every evening.     Benralizumab (FASENRA PEN) 30 MG/ML SOAJ Inject 1 mL (30 mg total) into the skin every 8 (eight) weeks. 1 mL 2   Calcium-Magnesium-Vitamin D (CALCIUM MAGNESIUM PO) Take 1 tablet by mouth at bedtime.     Cholecalciferol (VITAMIN D3 ULTRA STRENGTH) 125 MCG (5000 UT) capsule Take 5,000 Units by mouth every evening.     clonazePAM (KLONOPIN) 0.5 MG tablet Take 1 tablet (0.5 mg total) by mouth daily. (Patient taking differently: Take 0.5 mg by mouth in the morning.) 30 tablet 1   denosumab (PROLIA) 60 MG/ML SOSY injection Inject 60 mg into the skin every 6 (six) months.     doxepin (SINEQUAN) 25 MG capsule Take 1 capsule (25 mg total) by mouth at bedtime. 30 capsule 3   Evolocumab (REPATHA SURECLICK) 140 MG/ML SOAJ Inject 140 mg into the skin every 14 (fourteen) days. (Patient taking differently: Inject 140 mg into the skin every 21 ( twenty-one) days.) 2 mL 11   famotidine (PEPCID) 20 MG tablet TAKE 1 TABLET BY MOUTH TWICE A DAY (Patient taking differently: Take 20 mg by mouth every evening.) 60 tablet 11   ipratropium-albuterol (  DUONEB) 0.5-2.5 (3) MG/3ML SOLN Take 3 mLs by nebulization every 6 (six) hours as needed. (Patient taking differently: Take 3 mLs by nebulization every 6 (six) hours as needed (for shortness of breazth or wheezing).) 120 mL 5   metFORMIN (GLUCOPHAGE) 500 MG tablet Take 1 tablet (500 mg total) by mouth 2 (two) times daily with a meal. 60 tablet 2   nitroGLYCERIN (NITROSTAT) 0.4 MG SL tablet Place 1 tablet (0.4 mg total) under the tongue every 5 (five) minutes as needed for chest pain. 25 tablet  11   pindolol (VISKEN) 5 MG tablet Take 1 tablet (5 mg total) by mouth daily after breakfast. 30 tablet 3   risperiDONE (RISPERDAL) 0.5 MG tablet Take 1 tablet (0.5 mg total) by mouth daily. Take 1 mg Risperdal tab at bedtime (Patient taking differently: Take 0.5 mg by mouth in the morning.) 60 tablet 2   risperiDONE (RISPERDAL) 1 MG tablet Take 1 tablet (1 mg total) by mouth at bedtime. 30 tablet 2   Semaglutide,0.25 or 0.5MG /DOS, 2 MG/1.5ML SOPN Inject 0.25 mg into the skin every Friday.     venlafaxine XR (EFFEXOR XR) 75 MG 24 hr capsule Take 3 capsules (225 mg total) by mouth daily with breakfast. 90 capsule 1   Current Facility-Administered Medications  Medication Dose Route Frequency Provider Last Rate Last Admin   [START ON 11/06/2023] denosumab (PROLIA) injection 60 mg  60 mg Subcutaneous Once Bradd Canary, MD        PHYSICAL EXAM: There were no vitals filed for this visit. There is no height or weight on file to calculate BMI.  Wt Readings from Last 3 Encounters:  08/07/23 168 lb 3.2 oz (76.3 kg)  08/02/23 166 lb 9.6 oz (75.6 kg)  08/02/23 167 lb (75.8 kg)    General: Well developed, well nourished female in no apparent distress.  HEENT: AT/[place], no external lesions. Hearing intact to the spoken word Eyes: EOMI. Conjunctiva clear and no icterus. Neck: Trachea midline, neck supple without appreciable thyromegaly or lymphadenopathy and no palpable thyroid nodules Lungs: Clear to auscultation, no wheeze. Respirations not labored Heart: S1S2, Regular in rate and rhythm. No loud murmurs Abdomen: Soft, non tender, non distended, no masses, no striae Neurologic: Alert, oriented, normal speech, deep tendon biceps reflexes normal,  no gross focal neurological deficit Extremities: No pedal pitting edema, no tremors of outstretched hands. No spine tenderness Skin: Warm, color good. No sores or rashes noted Psychiatric: Does not appear depressed or anxious  PERTINENT HISTORIC LABORATORY  AND IMAGING STUDIES:  All pertinent laboratory results were reviewed. Please see HPI also for further details.   Lab Results  Component Value Date   ALKPHOS 55 08/07/2023   ALKPHOS 55 07/03/2023   ALKPHOS 49 04/25/2023    @RESULTFAST (VitD25OHD2:3,VitD25OHD3:3,VitD25OHTOT:3,VitD25OH:3)@ @RESUFASTREFRESH (TSH,T3Total,T4Tot,T4Free,ThyrPeroxAb)@     Last bone density done within the [facility] system: Last Dexa Date: @LPPLINKREFRESHABLE (161096)@    ASSESSMENT / PLAN  1. Age-related osteoporosis without current pathological fracture   2. Hypocalcemia   3. Vitamin D deficiency     Ms. Brandle is a 68 y.o. old female with osteoporosis based on DXA scan results and history of fragility fractures. Patient has multiple risk factors for osteoporosis, including age, postmenopausal *** status, history of vitamin D deficiency, history of low calcium intake, history of tobacco smoking, and ***  Ms. Corsello  recently fractured *** requiring open reduction and internal fixation. Patient is at risk for more bone fractures.     The patient also has a history  of GERD and in the past patient did not tolerate Fosamax due to GI side effects. In this context my recommendation is to treat with Reclast annual injections. I reviewed with the patient the potential complications of this treatment, including the rare but serious osteonecrosis of the jaw and atypical bone fractures. We also talk about the possibility of influenza-like symptoms after the infusion. The plan will be to treat with Reclast 5 mg IV once a year for at least 3 years.   We will plan for a follow-up appointment in one year before her 2nd dose of Reclast. At that time we will check her calcium, albumin ad creatinine levels.   ..Marland KitchenMarland KitchenTaking all these facts into consideration by recommendation is to start treatment with the anabolic agent teriparatide (Forteo). We will start the process of approval from [employer]. The doses will be 20 mcg SubQ  once daily. Initial administration should occur under circumstances in which the patient may sit or lie down in the event of orthostasis. The plan will be to treat for 2 years.     I think Ms. Ehinger would be a good candidate for treatment with alendronate (Fosamax). I explained to the potential complications of treatment with bisphosphonates, including the rare but serious osteonecrosis of the jaw and atypical bone fractures. We also talked about the gastrointestinal side effects of alendronate and the importance of following an appropriate administration technique.   Continue to take in at least 1200 calcium daily, between diet and supplements and current vitamin D supplement.  Regular weight bearing exercises recommended as tolerated by the patient.     Diagnoses and all orders for this visit:  Age-related osteoporosis without current pathological fracture -     C-Terminal Telopeptide -     Serum protein electrophoresis with reflex  Hypocalcemia -     Renal function panel -     Albumin -     Parathyroid hormone, intact (no Ca) -     Magnesium  Vitamin D deficiency -     VITAMIN D 25 Hydroxy (Vit-D Deficiency, Fractures)    DISPOSITION Follow up in clinic in *** months suggested.  All questions answered and patient verbalized understanding of the plan.  Victoria Remiel Corti, MD [facility] Endocrinology [facility] [address] [place], [address] [place], [place] [address] Phone # [phone]  At least part of this note was generated using voice recognition software. Inadvertent word errors may have occurred, which were not recognized during the proofreading process.

## 2023-08-11 ENCOUNTER — Telehealth (HOSPITAL_COMMUNITY): Payer: PPO | Admitting: Psychiatry

## 2023-08-11 ENCOUNTER — Encounter (HOSPITAL_COMMUNITY): Payer: Self-pay | Admitting: Psychiatry

## 2023-08-11 DIAGNOSIS — G4733 Obstructive sleep apnea (adult) (pediatric): Secondary | ICD-10-CM | POA: Diagnosis not present

## 2023-08-11 DIAGNOSIS — F419 Anxiety disorder, unspecified: Secondary | ICD-10-CM

## 2023-08-11 DIAGNOSIS — F32A Depression, unspecified: Secondary | ICD-10-CM

## 2023-08-17 ENCOUNTER — Other Ambulatory Visit: Payer: Self-pay

## 2023-08-17 ENCOUNTER — Other Ambulatory Visit: Payer: PPO

## 2023-08-17 DIAGNOSIS — E559 Vitamin D deficiency, unspecified: Secondary | ICD-10-CM | POA: Diagnosis not present

## 2023-08-17 DIAGNOSIS — M810 Age-related osteoporosis without current pathological fracture: Secondary | ICD-10-CM | POA: Diagnosis not present

## 2023-08-18 ENCOUNTER — Encounter: Payer: Self-pay | Admitting: Endocrinology

## 2023-08-19 ENCOUNTER — Other Ambulatory Visit (HOSPITAL_COMMUNITY): Payer: Self-pay | Admitting: Psychiatry

## 2023-08-19 DIAGNOSIS — F419 Anxiety disorder, unspecified: Secondary | ICD-10-CM

## 2023-08-21 DIAGNOSIS — M4125 Other idiopathic scoliosis, thoracolumbar region: Secondary | ICD-10-CM | POA: Diagnosis not present

## 2023-08-21 DIAGNOSIS — M4004 Postural kyphosis, thoracic region: Secondary | ICD-10-CM | POA: Diagnosis not present

## 2023-08-21 DIAGNOSIS — M5459 Other low back pain: Secondary | ICD-10-CM | POA: Diagnosis not present

## 2023-08-22 LAB — RENAL FUNCTION PANEL
Albumin: 3.9 g/dL (ref 3.6–5.1)
BUN: 11 mg/dL (ref 7–25)
CO2: 30 mmol/L (ref 20–32)
Calcium: 9.4 mg/dL (ref 8.6–10.4)
Chloride: 101 mmol/L (ref 98–110)
Creat: 0.59 mg/dL (ref 0.50–1.05)
Glucose, Bld: 86 mg/dL (ref 65–99)
Phosphorus: 4.1 mg/dL (ref 2.1–4.3)
Potassium: 4.6 mmol/L (ref 3.5–5.3)
Sodium: 138 mmol/L (ref 135–146)

## 2023-08-22 LAB — PARATHYROID HORMONE, INTACT (NO CA): PTH: 14 pg/mL — ABNORMAL LOW (ref 16–77)

## 2023-08-22 LAB — PROTEIN ELECTROPHORESIS, SERUM, WITH REFLEX
Albumin ELP: 3.6 g/dL — ABNORMAL LOW (ref 3.8–4.8)
Alpha 1: 0.3 g/dL (ref 0.2–0.3)
Alpha 2: 0.6 g/dL (ref 0.5–0.9)
Beta 2: 0.3 g/dL (ref 0.2–0.5)
Beta Globulin: 0.4 g/dL (ref 0.4–0.6)
Gamma Globulin: 0.7 g/dL — ABNORMAL LOW (ref 0.8–1.7)
Total Protein: 6 g/dL — ABNORMAL LOW (ref 6.1–8.1)

## 2023-08-22 LAB — MAGNESIUM: Magnesium: 2.1 mg/dL (ref 1.5–2.5)

## 2023-08-22 LAB — VITAMIN D 25 HYDROXY (VIT D DEFICIENCY, FRACTURES): Vit D, 25-Hydroxy: 95 ng/mL (ref 30–100)

## 2023-08-22 LAB — IFE INTERPRETATION

## 2023-08-22 LAB — C-TERMINAL TELOPEPTIDE: C-Telopeptide (CTx): 84 pg/mL

## 2023-08-23 ENCOUNTER — Encounter: Payer: Self-pay | Admitting: Genetic Counselor

## 2023-09-06 DIAGNOSIS — M545 Low back pain, unspecified: Secondary | ICD-10-CM | POA: Diagnosis not present

## 2023-09-06 DIAGNOSIS — M542 Cervicalgia: Secondary | ICD-10-CM | POA: Diagnosis not present

## 2023-09-06 DIAGNOSIS — M4120 Other idiopathic scoliosis, site unspecified: Secondary | ICD-10-CM | POA: Diagnosis not present

## 2023-09-09 ENCOUNTER — Encounter: Payer: Self-pay | Admitting: Hematology

## 2023-09-11 ENCOUNTER — Ambulatory Visit: Payer: PPO | Admitting: Endocrinology

## 2023-09-11 ENCOUNTER — Encounter: Payer: Self-pay | Admitting: Endocrinology

## 2023-09-11 ENCOUNTER — Inpatient Hospital Stay: Payer: PPO | Attending: Hematology

## 2023-09-11 VITALS — BP 110/64 | HR 75 | Resp 20 | Ht 62.0 in | Wt 167.2 lb

## 2023-09-11 DIAGNOSIS — M810 Age-related osteoporosis without current pathological fracture: Secondary | ICD-10-CM | POA: Diagnosis not present

## 2023-09-11 DIAGNOSIS — Z170 Estrogen receptor positive status [ER+]: Secondary | ICD-10-CM | POA: Diagnosis not present

## 2023-09-11 DIAGNOSIS — D649 Anemia, unspecified: Secondary | ICD-10-CM

## 2023-09-11 DIAGNOSIS — D500 Iron deficiency anemia secondary to blood loss (chronic): Secondary | ICD-10-CM | POA: Diagnosis not present

## 2023-09-11 DIAGNOSIS — Z1732 Human epidermal growth factor receptor 2 negative status: Secondary | ICD-10-CM | POA: Diagnosis not present

## 2023-09-11 DIAGNOSIS — M419 Scoliosis, unspecified: Secondary | ICD-10-CM | POA: Diagnosis not present

## 2023-09-11 DIAGNOSIS — E673 Hypervitaminosis D: Secondary | ICD-10-CM | POA: Diagnosis not present

## 2023-09-11 DIAGNOSIS — Z1721 Progesterone receptor positive status: Secondary | ICD-10-CM | POA: Insufficient documentation

## 2023-09-11 DIAGNOSIS — C50512 Malignant neoplasm of lower-outer quadrant of left female breast: Secondary | ICD-10-CM | POA: Insufficient documentation

## 2023-09-11 DIAGNOSIS — M40205 Unspecified kyphosis, thoracolumbar region: Secondary | ICD-10-CM

## 2023-09-11 LAB — CBC WITH DIFFERENTIAL (CANCER CENTER ONLY)
Abs Immature Granulocytes: 0.01 10*3/uL (ref 0.00–0.07)
Basophils Absolute: 0 10*3/uL (ref 0.0–0.1)
Basophils Relative: 0 %
Eosinophils Absolute: 0 10*3/uL (ref 0.0–0.5)
Eosinophils Relative: 0 %
HCT: 34.6 % — ABNORMAL LOW (ref 36.0–46.0)
Hemoglobin: 11.2 g/dL — ABNORMAL LOW (ref 12.0–15.0)
Immature Granulocytes: 0 %
Lymphocytes Relative: 18 %
Lymphs Abs: 0.8 10*3/uL (ref 0.7–4.0)
MCH: 28.5 pg (ref 26.0–34.0)
MCHC: 32.4 g/dL (ref 30.0–36.0)
MCV: 88 fL (ref 80.0–100.0)
Monocytes Absolute: 0.4 10*3/uL (ref 0.1–1.0)
Monocytes Relative: 8 %
Neutro Abs: 3.5 10*3/uL (ref 1.7–7.7)
Neutrophils Relative %: 74 %
Platelet Count: 255 10*3/uL (ref 150–400)
RBC: 3.93 MIL/uL (ref 3.87–5.11)
RDW: 17.2 % — ABNORMAL HIGH (ref 11.5–15.5)
WBC Count: 4.8 10*3/uL (ref 4.0–10.5)
nRBC: 0 % (ref 0.0–0.2)

## 2023-09-11 LAB — CMP (CANCER CENTER ONLY)
ALT: 12 U/L (ref 0–44)
AST: 13 U/L — ABNORMAL LOW (ref 15–41)
Albumin: 3.8 g/dL (ref 3.5–5.0)
Alkaline Phosphatase: 62 U/L (ref 38–126)
Anion gap: 5 (ref 5–15)
BUN: 11 mg/dL (ref 8–23)
CO2: 30 mmol/L (ref 22–32)
Calcium: 9.3 mg/dL (ref 8.9–10.3)
Chloride: 100 mmol/L (ref 98–111)
Creatinine: 0.66 mg/dL (ref 0.44–1.00)
GFR, Estimated: >60 mL/min (ref >60)
Glucose, Bld: 118 mg/dL — ABNORMAL HIGH (ref 70–99)
Potassium: 3.7 mmol/L (ref 3.5–5.1)
Sodium: 135 mmol/L (ref 135–145)
Total Bilirubin: 0.2 mg/dL (ref 0.0–1.2)
Total Protein: 6.2 g/dL — ABNORMAL LOW (ref 6.5–8.1)

## 2023-09-11 LAB — FERRITIN: Ferritin: 40 ng/mL (ref 11–307)

## 2023-09-11 NOTE — Progress Notes (Unsigned)
 BH MD/PA/NP OP Progress Note  09/15/2023 10:31 AM Victoria [name]  MRN:  [id number]  Visit Diagnosis:    ICD-10-CM   1. Anxiety and depression  F41.9 risperiDONE (RISPERDAL) 1 MG tablet   F32.A clonazePAM (KLONOPIN) 0.5 MG tablet    2. OSA (obstructive sleep apnea)  G47.33        Assessment: Victoria [name] is a 68 y.o. female with a history of anxiety, depression, OSA who presented to [facility] at [facility] for initial evaluation on 06/30/2022.  At initial evaluation patient reported symptoms of anxiety including feeling nervous or on edge, being unable to stop controlled worrying, worrying too much about different things, and fear that something awful would happen.  Patient's symptoms have been gradually progressing.  There was a period in November 2023 where she was having passive SI without intent or plan.  Patient also did endorse low mood, poor sleep, difficulty concentrating.  She does have a history of obstructive sleep apnea and does not use CPAP with last sleep study being over 10 years ago. Psychosocially patient has increased stress of caring for her adult son with cerebral palsy and planning the next steps for him after she and her husband pass or are no longer able to care for him.  Patient met criteria for MDD and generalized anxiety disorder. Of note on exam patient was found to have smacking of her lips repetitive movements of her mouth which are concerning for parkinsonism secondary to Zyprexa.     Victoria [name] presents for follow-up evaluation. Today, 09/15/23, patient reports that anxiety remained present inconsistent in the interim.  Notably patient is able to identify that current report might be based on a bit of a negative experience yesterday leading her to feel that anxiety is a bit worse than it has been.  The transition of her son to the group home did not go well though she is still getting used to not having him in the home.  Furthermore he is  still getting used to the group home which is led to a couple concerns arising.  Patient plans to reach out to the supervisor to discuss some of these concerns which is why her anxiety is increased today.  Otherwise her only other concern is a slight tremor in her neck.  This was not observed during interview today patient reports that it is sporadic lasting about 15 minutes and occurs a couple times a day.  The symptoms have occurred once in the past around 3 years ago and resolved on their own.  She denies any association with increased anxiety for when the symptoms onset.  We recommended continuing to monitor and observe symptoms to see if there is any correlation to increase anxiety.  If not suggested patient consult her primary care provider.  She does continue to take her medication consistently.  We will follow up in a month.  Psychotherapeutic interventions were used during today's session. From 10:03 AM to 10:26 AM we used empathic listening techniques and provided support. Used supportive interviewing techniques to validate patients feelings. Worked on cognitive re framing techniques and focusing on behavioral activation.  Improvement was evidenced by patient's participation.  Plan: - Continue Effexor XR 225 mg QD - Continue Risperdal 0.5 mg Qam and 1 mg QHS  - Continue Doxepin 25 mg QHS - Continue Klonopin 0.5 mg QD prn for anxiety - Continue pindolol 5 mg every day - Started Semaglutide in the interim managed by an Therapist, sports  provider - Recommend repeat sleep study, could consider dental devices OSA still present - CMP, CBC, lipid profile, Vit D, A1c, TSH reviewed - Continue with therapy every week through [place] [facility] - Completed a 10 week wellness group through the [facility], followed by a group for special needs parents - Discussed other support groups - Crisis resources reviewed - Follow up in a month  Chief Complaint:  Chief Complaint  Patient presents with   Follow-up   HPI: On  presentation today Victoria [name] reported that she is doing pretty good, other then a bit of nerves. The nerves are about [name] being in the new group home. Some things are not getting done which Victoria [name] is upset about. There are things like range of motion exercises and hygiene that is not being kept up with. Victoria [name] is worried about bringing up with the supervisor. Overall they are pleased with the place, but disappointed that some of the personal care not being done.   It has taken some getting used to not having [name] in the home. Her husband has been keeping busy with a landscaping project during this time. Victoria [name] has been going over to the house to see [name]. She finds the home to be quiet. Discussed the importance of finding purpose in her life now that she has more free time at home. For now she has been focusing on house projects that had built up.   Thinks the anxiety is about the same, but just different reasons. We did explore this and she was able to identify that today may be a bit worse as she was with [name] yesterday when he was upset and crying.   She has noticed that her throat has been shaking when she sits still. Occurs when she sits still and stops when she is talking. It lasts about 15 minute every day and occurs a few times a day. This has been going on about a month. She had one prior incident a few years. The events do not seem to be related to any particular stressor. Last time this occurred she had a cardiac work up with nothing being found. The symptoms resolved on their own with time.  Reviewed potential causes including anxiety, cardiac, or neurologic reasons.  That being the case the anxiety side would be managed with her current medication regimen much adjustment.  Encouraged patient to continue to monitor symptoms and potentially discuss with her PCP if they continue.    Past Psychiatric History: One  prior psychiatric hospitalization to [facility] in February of 2024 due to increased depression/anxiety.  No prior suicide attempts.  Patient did endorse passive SI in November of 2023 without any intent or plan.  She denies being connected with a psychiatrist in the past and had recently connected with a therapist at [facility] therapy.  Has taken Effexor (possible restlessness at 225), Cymbalta, Remeron, Ativan, Xanax, Klonopin, gabapentin, BuSpar, Zyprexa (TD), Latuda (akathisia), Risperdal, Doxepin,   Denies substance use other than 1 beer a day. Can increase to 2 when the weather is nice.  Past Medical History:  Past Medical History:  Diagnosis Date   Allergic bronchopulmonary aspergillosis (HCC) 2008   sees Dr Wenda Overland pulmonology   Anemia    iron deficiency, resolved   Anxiety    Asthma    Breast cancer (HCC)    CAD (coronary artery disease)    a. LHC 6/16:  oOM1 60, pRCA 25 >> med Rx b. cath 3/19 2nd OM with 95% stenosis s/p synergy  DES & anomalous RCA   CAP (community acquired pneumonia) 2016; 06/07/2016   Chronic bronchitis (HCC)    Chronic lower back pain    Complication of anesthesia    "think I have a hard time waking up from it"   Depression    mild   Diverticulitis    Diverticulosis    GERD (gastroesophageal reflux disease)    H/O hiatal hernia    Headache    "weekly" (08/23/2017)   History of echocardiogram    Echo 6/16:  Mod LVH, EF 60-65%, no RWMA, Gr 1 DD, trivial MR, normal LA size.   History of radiation therapy    Left breast- 12/26/22-01/23/23-Dr. Antony Blackbird   Hyperglycemia 11/20/2015   Hyperlipidemia, mixed 09/11/2007   Qualifier: Diagnosis of  By: Janit Bern   Did not tolerate Lipitor, zocor, Lovastatin, Pravastatin, Livalo, Crestor even low dose    IBS (irritable bowel syndrome)    Maxillary sinusitis    Normal cardiac stress test 11/2011   No evidence of ischemia or infarct.   Calculated ejection fraction 72%.   Obesity    OSA (obstructive sleep apnea) 02/2012   has stopped using  cpap   Osteoarthritis    Osteoporosis    Personal  history of radiation therapy    Pneumonia 11/2011   "before 2013 I hadn't had pneumonia since I was a child" (04/13/2012)   Pulmonary nodules    S/P angioplasty with stent 08/23/17 ostial 2nd OM with DES synnergy 08/24/2017   Schatzki's ring     Past Surgical History:  Procedure Laterality Date   ANTERIOR AND POSTERIOR REPAIR N/A 10/18/2022   Procedure: ANTERIOR (CYSTOCELE);  Surgeon: Marcelle Overlie, MD;  Location: [facility] OR;  Service: Gynecology;  Laterality: N/A;   APPENDECTOMY  1989   BREAST BIOPSY  11/03/2022   MM LT RADIOACTIVE SEED LOC MAMMO GUIDE 11/03/2022 [facility] MAMMOGRAPHY   BREAST LUMPECTOMY WITH RADIOACTIVE SEED LOCALIZATION Left 11/04/2022   Procedure: LEFT BREAST LUMPECTOMY WITH RADIOACTIVE SEED LOCALIZATION;  Surgeon: Griselda Miner, MD;  Location: [facility];  Service: General;  Laterality: Left;   CARDIAC CATHETERIZATION N/A 11/25/2014   Procedure: Right/Left Heart Cath and Coronary Angiography;  Surgeon: Lyn Records, MD;  Location: [facility] INVASIVE CV LAB;  Service: Cardiovascular;  Laterality: N/A;   CESAREAN SECTION  1985   COLONOSCOPY  03/2022   CORONARY ANGIOPLASTY WITH STENT PLACEMENT  08/23/2017   CORONARY STENT INTERVENTION N/A 08/23/2017   Procedure: CORONARY STENT INTERVENTION;  Surgeon: Kathleene Hazel, MD;  Location: [facility] INVASIVE CV LAB;  Service: Cardiovascular;  Laterality: N/A;   HERNIA REPAIR  04/13/2012   VHR laparoscopic   LAPAROSCOPIC BILATERAL SALPINGO OOPHERECTOMY Bilateral 10/18/2022   Procedure: LAPAROTOMY WITH BILATERAL SALPINGO OOPHORECTOMY;  Surgeon: Marcelle Overlie, MD;  Location: [facility] OR;  Service: Gynecology;  Laterality: Bilateral;   LEFT HEART CATH AND CORONARY ANGIOGRAPHY N/A 08/23/2017   Procedure: LEFT HEART CATH AND CORONARY ANGIOGRAPHY;  Surgeon: Kathleene Hazel, MD;  Location: [facility] INVASIVE CV LAB;  Service: Cardiovascular;  Laterality: N/A;   VAGINAL HYSTERECTOMY N/A 10/18/2022   Procedure: HYSTERECTOMY VAGINAL;   Surgeon: Marcelle Overlie, MD;  Location: [facility] OR;  Service: Gynecology;  Laterality: N/A;   VENTRAL HERNIA REPAIR  04/13/2012   Procedure: LAPAROSCOPIC VENTRAL HERNIA;  Surgeon: [name], MD;  Location: [facility] OR;  Service: General;  Laterality: N/A;  laparoscopic repair of incarcerated hernia   Family History:  Family History  Problem Relation Age of Onset  Breast cancer Mother 42       bilateral breast cancer dx. 78, met to liver   Hypertension Mother    Diabetes Mother    Diverticulosis Father    Prostate cancer Father    Breast cancer Sister 44       DCIS at 60, IDC at 19, PALB2+   Cancer Sister        breast cancer, invasive ductal carcinoma in 2022,DCIS at 80 with 4 weeks of radiation, 5 years of Tamoxifen    Pulmonary embolism Brother        recurrent   Heart attack Maternal Grandfather    Ovarian cancer Paternal Grandmother    Cerebral palsy Son    Prostate cancer Paternal Uncle    Breast cancer Niece 30       PALB2+   Osteoporosis Niece    Stroke Neg Hx    Colon cancer Neg Hx    Esophageal cancer Neg Hx    Stomach cancer Neg Hx    Rectal cancer Neg Hx     Social History:  Social History   Socioeconomic History   Marital status: Married    Spouse name: Not on file   Number of children: 1   Years of education: 14   Highest education level: Associate degree: academic program  Occupational History   Occupation: Disabled   Tobacco Use   Smoking status: Never    Passive exposure: Never   Smokeless tobacco: Never  Vaping Use   Vaping status: Never Used  Substance and Sexual Activity   Alcohol use: Yes    Alcohol/week: 4.0 - 7.0 standard drinks of alcohol    Types: 4 - 7 Standard drinks or equivalent per week   Drug use: No   Sexual activity: Yes    Birth control/protection: Surgical    Comment: gluten free, lives with husband and son with CP quadriplegia  Other Topics Concern   Not on file  Social History Narrative   Cares for son with cerebral  palsy.    Lives at home with her husband and son.   Right-handed.   2 cups caffeine per day.   One story home   Social Drivers of Health   Financial Resource Strain: Low Risk  (01/18/2023)   Overall Financial Resource Strain (CARDIA)    Difficulty of Paying Living Expenses: Not hard at all  Food Insecurity: No Food Insecurity (07/30/2023)   Hunger Vital Sign    Worried About Running Out of Food in the Last Year: Never true    Ran Out of Food in the Last Year: Never true  Transportation Needs: No Transportation Needs (07/30/2023)   PRAPARE - Administrator, Civil Service (Medical): No    Lack of Transportation (Non-Medical): No  Physical Activity: Inactive (02/10/2023)   Exercise Vital Sign    Days of Exercise per Week: 0 days    Minutes of Exercise per Session: 0 min  Stress: Stress Concern Present (01/18/2023)   Victoria [name] of Occupational Health - Occupational Stress Questionnaire    Feeling of Stress : To some extent  Social Connections: Socially Integrated (07/29/2023)   Social Connection and Isolation Panel [NHANES]    Frequency of Communication with Friends and Family: More than three times a week    Frequency of Social Gatherings with Friends and Family: Once a week    Attends Religious Services: More than 4 times per year    Active Member of [employer] or Organizations: Yes  Attends Banker Meetings: More than 4 times per year    Marital Status: Married    Allergies:  Allergies  Allergen Reactions   Latuda [Lurasidone Hcl] Other (See Comments)    PER THE PT, CAUSED RESTLESSNESS   Beclomethasone Dipropionate Hives and Other (See Comments)    Weight gain, also   Flexeril [Cyclobenzaprine] Anxiety   Mometasone Furo-Formoterol Fum Hives and Other (See Comments)    Weight gain, also   Sulfonamide Derivatives Hives and Rash   Statins Other (See Comments)    Myalgias, RLS   Fluticasone Rash and Other (See Comments)    "Weight gain," too     Current Medications: Current Outpatient Medications  Medication Sig Dispense Refill   acetaminophen (TYLENOL) 650 MG CR tablet Take 650-1,300 mg by mouth every 8 (eight) hours as needed for pain.     albuterol (VENTOLIN HFA) 108 (90 Base) MCG/ACT inhaler Inhale 1-2 puffs into the lungs every 6 (six) hours as needed. 8 g 2   apixaban (ELIQUIS) 5 MG TABS tablet Take 2 tablets (10 mg total) by mouth 2 (two) times daily for 6 days, THEN 1 tablet (5 mg total) 2 (two) times daily. 60 tablet 0   apixaban (ELIQUIS) 5 MG TABS tablet Take 1 tablet (5 mg total) by mouth 2 (two) times daily. 60 tablet 4   ascorbic acid (VITAMIN C) 500 MG tablet Take 500 mg by mouth every evening.     Benralizumab (FASENRA PEN) 30 MG/ML SOAJ Inject 1 mL (30 mg total) into the skin every 8 (eight) weeks. 1 mL 2   Calcium-Magnesium-Vitamin D (CALCIUM MAGNESIUM PO) Take 1 tablet by mouth at bedtime.     Cholecalciferol (VITAMIN D3 ULTRA STRENGTH) 125 MCG (5000 UT) capsule Take 5,000 Units by mouth every evening.     clonazePAM (KLONOPIN) 0.5 MG tablet Take 1 tablet (0.5 mg total) by mouth in the morning. 30 tablet 1   denosumab (PROLIA) 60 MG/ML SOSY injection Inject 60 mg into the skin every 6 (six) months.     doxepin (SINEQUAN) 25 MG capsule Take 1 capsule (25 mg total) by mouth at bedtime. 30 capsule 3   Evolocumab (REPATHA SURECLICK) 140 MG/ML SOAJ Inject 140 mg into the skin every 14 (fourteen) days. (Patient taking differently: Inject 140 mg into the skin every 21 ( twenty-one) days.) 2 mL 11   famotidine (PEPCID) 20 MG tablet TAKE 1 TABLET BY MOUTH TWICE A DAY (Patient taking differently: Take 20 mg by mouth every evening.) 60 tablet 11   ipratropium-albuterol (DUONEB) 0.5-2.5 (3) MG/3ML SOLN Take 3 mLs by nebulization every 6 (six) hours as needed. (Patient taking differently: Take 3 mLs by nebulization every 6 (six) hours as needed (for shortness of breazth or wheezing).) 120 mL 5   nitroGLYCERIN (NITROSTAT) 0.4 MG  SL tablet Place 1 tablet (0.4 mg total) under the tongue every 5 (five) minutes as needed for chest pain. 25 tablet 11   pindolol (VISKEN) 5 MG tablet Take 1 tablet (5 mg total) by mouth daily after breakfast. 30 tablet 3   risperiDONE (RISPERDAL) 0.5 MG tablet Take 1 tablet (0.5 mg total) by mouth daily. Take 1 mg Risperdal tab at bedtime (Patient taking differently: Take 0.5 mg by mouth in the morning.) 60 tablet 2   risperiDONE (RISPERDAL) 1 MG tablet Take 1 tablet (1 mg total) by mouth at bedtime. 30 tablet 2   Semaglutide,0.25 or 0.5MG /DOS, 2 MG/1.5ML SOPN Inject 0.25 mg into the skin every Friday.  venlafaxine XR (EFFEXOR XR) 75 MG 24 hr capsule Take 3 capsules (225 mg total) by mouth daily with breakfast. 90 capsule 1   Current Facility-Administered Medications  Medication Dose Route Frequency Provider Last Rate Last Admin   [START ON 11/06/2023] denosumab (PROLIA) injection 60 mg  60 mg Subcutaneous Once Bradd Canary, MD         Psychiatric Specialty Exam: Review of Systems  There were no vitals taken for this visit.There is no height or weight on file to calculate BMI.  General Appearance: Well Groomed  Eye Contact:  Good  Speech:  Clear and Coherent and Normal Rate  Volume:  Normal  Mood:  Anxious and Euthymic  Affect:  Appropriate  Thought Process:  Coherent  Orientation:  Full (Time, Place, and Person)  Thought Content: Logical   Suicidal Thoughts:  No  Homicidal Thoughts:  No  Memory:  Immediate;   Good  Judgement:  Fair  Insight:  Fair  Psychomotor Activity:  Normal  Concentration:  Concentration: Good  Recall:  Good  Fund of Knowledge: Fair  Language: Good  Akathisia:  No    AIMS (if indicated): not done  Assets:  Communication Skills Desire for Improvement Financial Resources/Insurance Housing Transportation  ADL's:  Intact  Cognition: WNL  Sleep:  Good   Metabolic Disorder Labs: Lab Results  Component Value Date   HGBA1C 5.3 04/25/2023   MPG  117 10/22/2008   Lab Results  Component Value Date   PROLACTIN 15.4 08/12/2022   Lab Results  Component Value Date   CHOL 126 04/25/2023   TRIG 125.0 04/25/2023   HDL 63.20 04/25/2023   CHOLHDL 2 04/25/2023   VLDL 25.0 04/25/2023   LDLCALC 38 04/25/2023   LDLCALC 111 (H) 01/19/2023   Lab Results  Component Value Date   TSH 1.79 04/25/2023   TSH 1.76 01/19/2023    Therapeutic Level Labs: No results found for: "LITHIUM" No results found for: "VALPROATE" No results found for: "CBMZ"   Screenings: AIMS    Flowsheet Row Admission (Discharged) from 08/12/2022 in [facility] BEHAVIORAL MEDICINE  AIMS Total Score 2      AUDIT    Flowsheet Row Admission (Discharged) from 08/12/2022 in [facility] BEHAVIORAL MEDICINE  Alcohol Use Disorder Identification Test Final Score (AUDIT) 0      GAD-7    Flowsheet Row Video Visit from 10/13/2022 in [facility] at [facility] Office Visit from 06/30/2022 in [facility] Office Visit from 05/26/2022 in [facility] Care at [facility] Office Visit from 01/16/2017 in [facility] at [facility]  Total GAD-7 Score 0 11 11 3       PHQ2-9    Flowsheet Row Clinical Support from 07/12/2023 in [facility] Clinical Support from 02/10/2023 in [facility] Primary Care at [facility] Office Visit from 01/19/2023 in [facility] Primary Care at [facility] Video Visit from 10/13/2022 in [facility] Primary Care at [facility] Telephone from 08/19/2022 in [facility] Community Care Coordination  PHQ-2 Total Score 0 0 0 0 2  PHQ-9 Total Score -- -- -- 0 --      Flowsheet Row ED to Hosp-Admission (Discharged) from 07/28/2023 in [facility] [facility] 4TH FLOOR [facility]GRESSIVE CARE AND UROLOGY Admission (Discharged) from 11/04/2022 in [facility] Admission (Discharged)  from 10/18/2022 in [place] [facility]  C-SSRS RISK CATEGORY  No Risk No Risk No Risk       Collaboration of Care: Collaboration of Care: Medication Management AEB medication prescription, Primary Care Provider AEB chart review, and Other provider involved in patient's care AEB Endocrinology chart review  Patient/Guardian was advised Release of Information must be obtained prior to any record release in order to collaborate their care with an outside provider. Patient/Guardian was advised if they have not already done so to contact the registration department to sign all necessary forms in order for [name] to release information regarding their care.   Consent: Patient/Guardian gives verbal consent for treatment and assignment of benefits for services provided during this visit. Patient/Guardian expressed understanding and agreed to proceed.    Stasia Cavalier, MD 09/15/2023, 10:31 AM   Virtual Visit via Video Note  I connected with [name] on 09/15/23 at 10:00 AM EDT by a video enabled telemedicine application and verified that I am speaking with the correct person using two identifiers.  Location: Patient: Home Provider: Home Office   I discussed the limitations of evaluation and management by telemedicine and the availability of in person appointments. The patient expressed understanding and agreed to proceed.   I discussed the assessment and treatment plan with the patient. The patient was provided an opportunity to ask questions and all were answered. The patient agreed with the plan and demonstrated an understanding of the instructions.   The patient was advised to call back or seek an in-person evaluation if the symptoms worsen or if the condition fails to improve as anticipated.  I provided 20 minutes of non-face-to-face time during this encounter.   Stasia Cavalier, MD

## 2023-09-11 NOTE — Patient Instructions (Signed)
 Continue prolia with your PCP, every 6 months.  Stop Vit D 5000 units for now. Okay to take Vit D with calcium combined pill.  Recheck Vit D level in 6 months, if Vit D 30-50 range, restart Vit D 2000 units daily. Okay to do with PCP.

## 2023-09-11 NOTE — Progress Notes (Signed)
 Outpatient Endocrinology Note Victoria Brown Dunlap, MD   Patient's Name: Victoria [name]    DOB: 1956-01-20    MRN: [id number]  REASON OF VISIT: Follow-up for osteoporosis  REFERRING PROVIDER: Bradd Canary, MD  PCP: Chilton Greathouse, MD  HISTORY OF PRESENT ILLNESS:   Victoria [name] is a 68 y.o. old female with past medical history listed below, is here for follow-up of bone health issues / osteoporosis.  Pertinent Bone Health History: Patient has longstanding history of osteoporosis, patient reports was diagnosed around the age of 72, ?  steroid-induced.  She was requiring frequent systemic steroid for asthma.  She had taken Fosamax / alendronate for several years until 2022, based on chart review Fosamax was started at least in 2008, she had some years of discontinuation of Fosamax however she had been on it for more than 10 years prior to switching to Prolia injection in May 2022.  Patient had improvement on the bone mineral density in the past when on Fosamax.  Patient also had improvement on bone density after being on Prolia from 2022.  Patient has scoliosis, following with orthopedic surgeon at [facility] and considering spine surgery who recommended anabolic therapy for osteoporosis, no records available to review and patient is referred to endocrinology for further evaluation and management.  Bone Health Concerns:   Fracture history: Denies.  She has a scoliosis /kyphosis.  Osteoporosis medications: Fosamax 10+ years, Prolia started in May 2022, every 1-month last injection in December 2024.  Secondary workup for osteoporosis: No details record of secondary laboratory work available to review.  Osteoporosis was thought to be related to frequent systemic steroid use. Patient has required frequent steroid for asthma for several years.  She has also been requiring injectable steroids for back, hip and knee frequently.  No history of kidney stone.  No malabsorption, liver or thyroid disorder.  No  smoking or excessive alcohol history.  No frequent falls.  She had menopause around the age of 21s.  Calcium intake from supplements: 500 mg two times a day.  Calcium with vitamin D. Dietary calcium intake: milk every day, yogurt, cheese Current vitamin D intake: 5000 international units daily.  Relevant comorbidities: History of breast cancer diagnosed in April 2024, status post lumpectomy and external beam radiation. No history of diabetes mellitus.  No Rheumatoid arthritis. No history of GERD. Yes No history of recent invasive dental procedures. Not planning dental procedures in the near future. She has history of coronary artery disease status post stent in 2019.  No heart failure.  Imagings: DEXA scan in September 2024 completed at [facility]  T-score left femoral neck -1.1, right femoral neck -1.5, one third forearm left -2.8 , overall improvement on the bone mineral density.  DXA scan in September 2022 T-score at left femoral neck -1.5, right femoral neck -2.3, forearm -2.6.  She had multiple DEXA scan since 2004.  In the recent DEXA scan, bone mineral density and T-score not measured at the lumbar spine probably related to scoliosis.  In 2013 T-score at lumbar spine was -0.7, normal.  # September 2024 MRI lumbar spine  Levoscoliotic curvature of the lumbar spine with acquired fusion of the posterior elements from L1-S1.  She had CT lumbar spine in September 2024.  No reported compression vertebral fracture.  Interval History: Lab results from end of February reviewed, CTX 84, elevated vitamin D level of 95 with low PTH likely related to high Vitamin D.  Normal thyroid function test.  Normal serum calcium and electrolytes.  Protein electrophoresis no monoclonal protein detected.  She has no fall and fracture.  She has been following at [facility], scoliosis and spine care , [name] : Office note from April 06, 2023 scanned into media reviewed.  She  has a scoliosis and kyphosis.  She has been on physical therapy.  She has multilevel severe lumbar degenerative disease.  Given the low bone density she was planned for conservative care at that time.  She is currently taking vitamin D3 5000 international unit daily in addition to calcium/vitamin D supplement.  Calcium / Vit D combined. Vit D3 5000 international unit  daily.   Reuel Derby, MD [phone] [facility] [place],  [address], [address] [place] [address]  REVIEW OF SYSTEMS:  As per history of present illness.   PAST MEDICAL HISTORY: Past Medical History:  Diagnosis Date   Allergic bronchopulmonary aspergillosis (HCC) 2008   sees Dr Wenda Overland pulmonology   Anemia    iron deficiency, resolved   Anxiety    Asthma    Breast cancer (HCC)    CAD (coronary artery disease)    a. LHC 6/16:  oOM1 60, pRCA 25 >> med Rx b. cath 3/19 2nd OM with 95% stenosis s/p synergy DES & anomalous RCA   CAP (community acquired pneumonia) 2016; 06/07/2016   Chronic bronchitis (HCC)    Chronic lower back pain    Complication of anesthesia    "think I have a hard time waking up from it"   Depression    mild   Diverticulitis    Diverticulosis    GERD (gastroesophageal reflux disease)    H/O hiatal hernia    Headache    "weekly" (08/23/2017)   History of echocardiogram    Echo 6/16:  Mod LVH, EF 60-65%, no RWMA, Gr 1 DD, trivial MR, normal LA size.   History of radiation therapy    Left breast- 12/26/22-01/23/23-Dr. Antony Blackbird   Hyperglycemia 11/20/2015   Hyperlipidemia, mixed 09/11/2007   Qualifier: Diagnosis of  By: Janit Bern   Did not tolerate Lipitor, zocor, Lovastatin, Pravastatin, Livalo, Crestor even low dose    IBS (irritable bowel syndrome)    Maxillary sinusitis    Normal cardiac stress test 11/2011   No evidence of ischemia or infarct.   Calculated ejection fraction 72%.   Obesity    OSA (obstructive sleep apnea) 02/2012   has stopped  using  cpap   Osteoarthritis    Osteoporosis    Personal history of radiation therapy    Pneumonia 11/2011   "before 2013 I hadn't had pneumonia since I was a child" (04/13/2012)   Pulmonary nodules    S/P angioplasty with stent 08/23/17 ostial 2nd OM with DES synnergy 08/24/2017   Schatzki's ring     PAST SURGICAL HISTORY: Past Surgical History:  Procedure Laterality Date   ANTERIOR AND POSTERIOR REPAIR N/A 10/18/2022   Procedure: ANTERIOR (CYSTOCELE);  Surgeon: Marcelle Overlie, MD;  Location: [facility] OR;  Service: Gynecology;  Laterality: N/A;   APPENDECTOMY  1989   BREAST BIOPSY  11/03/2022   MM LT RADIOACTIVE SEED LOC MAMMO GUIDE 11/03/2022 [facility] MAMMOGRAPHY   BREAST LUMPECTOMY WITH RADIOACTIVE SEED LOCALIZATION Left 11/04/2022   Procedure: LEFT BREAST LUMPECTOMY WITH RADIOACTIVE SEED LOCALIZATION;  Surgeon: Griselda Miner, MD;  Location: [facility];  Service: General;  Laterality: Left;   CARDIAC CATHETERIZATION N/A 11/25/2014   Procedure: Right/Left Heart Cath  and Coronary Angiography;  Surgeon: Lyn Records, MD;  Location: [facility] INVASIVE CV LAB;  Service: Cardiovascular;  Laterality: N/A;   CESAREAN SECTION  1985   COLONOSCOPY  03/2022   CORONARY ANGIOPLASTY WITH STENT PLACEMENT  08/23/2017   CORONARY STENT INTERVENTION N/A 08/23/2017   Procedure: CORONARY STENT INTERVENTION;  Surgeon: Kathleene Hazel, MD;  Location: [facility] INVASIVE CV LAB;  Service: Cardiovascular;  Laterality: N/A;   HERNIA REPAIR  04/13/2012   VHR laparoscopic   LAPAROSCOPIC BILATERAL SALPINGO OOPHERECTOMY Bilateral 10/18/2022   Procedure: LAPAROTOMY WITH BILATERAL SALPINGO OOPHORECTOMY;  Surgeon: Marcelle Overlie, MD;  Location: [facility] OR;  Service: Gynecology;  Laterality: Bilateral;   LEFT HEART CATH AND CORONARY ANGIOGRAPHY N/A 08/23/2017   Procedure: LEFT HEART CATH AND CORONARY ANGIOGRAPHY;  Surgeon: Kathleene Hazel, MD;  Location: [facility] INVASIVE CV LAB;  Service: Cardiovascular;  Laterality:  N/A;   VAGINAL HYSTERECTOMY N/A 10/18/2022   Procedure: HYSTERECTOMY VAGINAL;  Surgeon: Marcelle Overlie, MD;  Location: [facility] OR;  Service: Gynecology;  Laterality: N/A;   VENTRAL HERNIA REPAIR  04/13/2012   Procedure: LAPAROSCOPIC VENTRAL HERNIA;  Surgeon: [name], MD;  Location: [facility] OR;  Service: General;  Laterality: N/A;  laparoscopic repair of incarcerated hernia    ALLERGIES: Allergies  Allergen Reactions   Latuda [Lurasidone Hcl] Other (See Comments)    PER THE PT, CAUSED RESTLESSNESS   Beclomethasone Dipropionate Hives and Other (See Comments)    Weight gain, also   Flexeril [Cyclobenzaprine] Anxiety   Mometasone Furo-Formoterol Fum Hives and Other (See Comments)    Weight gain, also   Sulfonamide Derivatives Hives and Rash   Statins Other (See Comments)    Myalgias, RLS   Fluticasone Rash and Other (See Comments)    "Weight gain," too    FAMILY HISTORY:  Family History  Problem Relation Age of Onset   Breast cancer Mother 5       bilateral breast cancer dx. 74, met to liver   Hypertension Mother    Diabetes Mother    Diverticulosis Father    Prostate cancer Father    Breast cancer Sister 78       DCIS at 75, IDC at 61, PALB2+   Cancer Sister        breast cancer, invasive ductal carcinoma in 2022,DCIS at 79 with 4 weeks of radiation, 5 years of Tamoxifen    Pulmonary embolism Brother        recurrent   Heart attack Maternal Grandfather    Ovarian cancer Paternal Grandmother    Cerebral palsy Son    Prostate cancer Paternal Uncle    Breast cancer Niece 40       PALB2+   Osteoporosis Niece    Stroke Neg Hx    Colon cancer Neg Hx    Esophageal cancer Neg Hx    Stomach cancer Neg Hx    Rectal cancer Neg Hx     SOCIAL HISTORY: Social History   Socioeconomic History   Marital status: Married    Spouse name: Not on file   Number of children: 1   Years of education: 14   Highest education level: Associate degree: academic program  Occupational  History   Occupation: Disabled   Tobacco Use   Smoking status: Never    Passive exposure: Never   Smokeless tobacco: Never  Vaping Use   Vaping status: Never Used  Substance and Sexual Activity   Alcohol use: Yes    Alcohol/week: 4.0 - 7.0 standard  drinks of alcohol    Types: 4 - 7 Standard drinks or equivalent per week   Drug use: No   Sexual activity: Yes    Birth control/protection: Surgical    Comment: gluten free, lives with husband and son with CP quadriplegia  Other Topics Concern   Not on file  Social History Narrative   Cares for son with cerebral palsy.    Lives at home with her husband and son.   Right-handed.   2 cups caffeine per day.   One story home   Social Drivers of Health   Financial Resource Strain: Low Risk  (01/18/2023)   Overall Financial Resource Strain (CARDIA)    Difficulty of Paying Living Expenses: Not hard at all  Food Insecurity: No Food Insecurity (07/30/2023)   Hunger Vital Sign    Worried About Running Out of Food in the Last Year: Never true    Ran Out of Food in the Last Year: Never true  Transportation Needs: No Transportation Needs (07/30/2023)   PRAPARE - Administrator, Civil Service (Medical): No    Lack of Transportation (Non-Medical): No  Physical Activity: Inactive (02/10/2023)   Exercise Vital Sign    Days of Exercise per Week: 0 days    Minutes of Exercise per Session: 0 min  Stress: Stress Concern Present (01/18/2023)   [employer] of Occupational Health - Occupational Stress Questionnaire    Feeling of Stress : To some extent  Social Connections: Socially Integrated (07/29/2023)   Social Connection and Isolation Panel [NHANES]    Frequency of Communication with Friends and Family: More than three times a week    Frequency of Social Gatherings with Friends and Family: Once a week    Attends Religious Services: More than 4 times per year    Active Member of [employer] or Organizations: Yes    Attends Museum/gallery exhibitions officer: More than 4 times per year    Marital Status: Married    MEDICATIONS:  Current Outpatient Medications  Medication Sig Dispense Refill   acetaminophen (TYLENOL) 650 MG CR tablet Take 650-1,300 mg by mouth every 8 (eight) hours as needed for pain.     albuterol (VENTOLIN HFA) 108 (90 Base) MCG/ACT inhaler Inhale 1-2 puffs into the lungs every 6 (six) hours as needed. 8 g 2   apixaban (ELIQUIS) 5 MG TABS tablet Take 1 tablet (5 mg total) by mouth 2 (two) times daily. 60 tablet 4   ascorbic acid (VITAMIN C) 500 MG tablet Take 500 mg by mouth every evening.     Benralizumab (FASENRA PEN) 30 MG/ML SOAJ Inject 1 mL (30 mg total) into the skin every 8 (eight) weeks. 1 mL 2   Calcium-Magnesium-Vitamin D (CALCIUM MAGNESIUM PO) Take 1 tablet by mouth at bedtime.     Cholecalciferol (VITAMIN D3 ULTRA STRENGTH) 125 MCG (5000 UT) capsule Take 5,000 Units by mouth every evening.     clonazePAM (KLONOPIN) 0.5 MG tablet Take 1 tablet (0.5 mg total) by mouth daily. (Patient taking differently: Take 0.5 mg by mouth in the morning.) 30 tablet 1   denosumab (PROLIA) 60 MG/ML SOSY injection Inject 60 mg into the skin every 6 (six) months.     doxepin (SINEQUAN) 25 MG capsule Take 1 capsule (25 mg total) by mouth at bedtime. 30 capsule 3   Evolocumab (REPATHA SURECLICK) 140 MG/ML SOAJ Inject 140 mg into the skin every 14 (fourteen) days. (Patient taking differently: Inject 140 mg into the skin every  21 ( twenty-one) days.) 2 mL 11   famotidine (PEPCID) 20 MG tablet TAKE 1 TABLET BY MOUTH TWICE A DAY (Patient taking differently: Take 20 mg by mouth every evening.) 60 tablet 11   ipratropium-albuterol (DUONEB) 0.5-2.5 (3) MG/3ML SOLN Take 3 mLs by nebulization every 6 (six) hours as needed. (Patient taking differently: Take 3 mLs by nebulization every 6 (six) hours as needed (for shortness of breazth or wheezing).) 120 mL 5   nitroGLYCERIN (NITROSTAT) 0.4 MG SL tablet Place 1 tablet (0.4 mg total)  under the tongue every 5 (five) minutes as needed for chest pain. 25 tablet 11   pindolol (VISKEN) 5 MG tablet Take 1 tablet (5 mg total) by mouth daily after breakfast. 30 tablet 3   risperiDONE (RISPERDAL) 0.5 MG tablet Take 1 tablet (0.5 mg total) by mouth daily. Take 1 mg Risperdal tab at bedtime (Patient taking differently: Take 0.5 mg by mouth in the morning.) 60 tablet 2   risperiDONE (RISPERDAL) 1 MG tablet Take 1 tablet (1 mg total) by mouth at bedtime. 30 tablet 2   Semaglutide,0.25 or 0.5MG /DOS, 2 MG/1.5ML SOPN Inject 0.25 mg into the skin every Friday.     venlafaxine XR (EFFEXOR XR) 75 MG 24 hr capsule Take 3 capsules (225 mg total) by mouth daily with breakfast. 90 capsule 1   apixaban (ELIQUIS) 5 MG TABS tablet Take 2 tablets (10 mg total) by mouth 2 (two) times daily for 6 days, THEN 1 tablet (5 mg total) 2 (two) times daily. 60 tablet 0   Current Facility-Administered Medications  Medication Dose Route Frequency Provider Last Rate Last Admin   [START ON 11/06/2023] denosumab (PROLIA) injection 60 mg  60 mg Subcutaneous Once Bradd Canary, MD        PHYSICAL EXAM: Vitals:   09/11/23 0939  BP: 110/64  Pulse: 75  Resp: 20  SpO2: 99%  Weight: 167 lb 3.2 oz (75.8 kg)  Height: 5\' 2"  (1.575 m)   Body mass index is 30.58 kg/m.  Wt Readings from Last 3 Encounters:  09/11/23 167 lb 3.2 oz (75.8 kg)  08/07/23 168 lb 3.2 oz (76.3 kg)  08/02/23 166 lb 9.6 oz (75.6 kg)    General: Well developed, well nourished female in no apparent distress.  HEENT: AT/[place], no external lesions. Hearing intact to the spoken word Neurologic: Alert, oriented, normal speech Extremities : no spine tenderness. Scoliosis and kyphosis + Psychiatric: Does not appear depressed or anxious  PERTINENT HISTORIC LABORATORY AND IMAGING STUDIES:  All pertinent laboratory results were reviewed. Please see HPI also for further details.   Lab Results  Component Value Date   ALKPHOS 62 09/11/2023   ALKPHOS  55 08/07/2023   ALKPHOS 55 07/03/2023     ASSESSMENT / PLAN  1. Age-related osteoporosis without current pathological fracture   2. Hypervitaminosis D   3. Scoliosis of thoracolumbar spine, unspecified scoliosis type   4. Kyphosis of thoracolumbar region, unspecified kyphosis type     - Patient has longstanding history of osteoporosis, based on DXA scan results. Patient has multiple risk factors for osteoporosis, including age, postmenopausal status, long-term frequent systemic glucocorticoid use.  She was treated with Fosamax/alendronate 10+ years, switched to Prolia in May 2022 has been on every 48-month, last Prolia injection was in December 2024. -Last DEXA scan in September 2024 with lowest T-score at forearm -2.8, and T-score at hips are in the osteopenia range, no BMD at lumbar spine reported. -CTX level which is bone turnover marker  for bone resorption is reassuring 84, not elevated which indicates no ongoing significant bone loss. -Patient has been following at [facility] Scoliosis, scoliosis and spine care , with [name] : Scoliosis and kyphosis, plan for conservative management/care due to osteoporosis.  Plan: -With reassuring CTX/bone turnover marker and relatively improved and acceptable bone mineral density on the DEXA scan in September 2024 patient has optimal and acceptable treatment for osteoporosis. -She has no fragility fracture.  No vertebral compression fracture. -For the osteoporosis she has no obvious indication of anabolic therapy.  Also discussed that as she has been on antiresorptive therapy for several years and also has low CTX level : there will be suboptimal effect of anabolic treatment. Discussed that effectiveness of anabolic therapy would be reduced due to long-term use of antiresorptive therapy in the form of alendronate and Prolia in last several years. -Okay to continue Prolia 60 mg every 6 months.  Last injection was in December 2024.  She will be getting  Prolia from primary care provider clinic locally.  # Hypervitaminosis D: -Recently mild low PTH is likely related to high vitamin D level.  Recent vitamin D level of 95. -Advised patient to stop vitamin D3 5000 international unit daily, okay to take calcium with vitamin D supplement. -Check vitamin D level in 6 months, patient will discuss and check with her primary care provider. -Okay to resume vitamin D3 2000 international unit daily once vitamin D level is in the range of 30-50.   -Discussed about fall precaution.  Discussed about weightbearing exercise.  -Patient will continue to follow-up with scoliosis, spine clinic for her known scoliosis and kyphosis.   Diagnoses and all orders for this visit:  Age-related osteoporosis without current pathological fracture  Hypervitaminosis D  Scoliosis of thoracolumbar spine, unspecified scoliosis type  Kyphosis of thoracolumbar region, unspecified kyphosis type   DISPOSITION Follow up in clinic in 12 months suggested.  All questions answered and patient verbalized understanding of the plan.  Victoria Mel Langan, MD [facility] Endocrinology [facility] [address] [place], [address] [place], [place] [address] Phone # [phone]  At least part of this note was generated using voice recognition software. Inadvertent word errors may have occurred, which were not recognized during the proofreading process.

## 2023-09-12 DIAGNOSIS — M542 Cervicalgia: Secondary | ICD-10-CM | POA: Diagnosis not present

## 2023-09-12 DIAGNOSIS — M545 Low back pain, unspecified: Secondary | ICD-10-CM | POA: Diagnosis not present

## 2023-09-12 DIAGNOSIS — M4120 Other idiopathic scoliosis, site unspecified: Secondary | ICD-10-CM | POA: Diagnosis not present

## 2023-09-14 IMAGING — CT CT T SPINE W/O CM
4 of 8 series · 14 of 33 positions shown, 15 images · non-contrast
Comparison: CT chest 04/16/2021

CLINICAL DATA: Scoliosis with worsening pain. Pain in the left ribs
and sternum.

EXAM:
CT THORACIC SPINE WITHOUT CONTRAST
TECHNIQUE: Multidetector CT images of the thoracic were obtained using the
standard protocol without intravenous contrast.
RADIATION DOSE REDUCTION: This exam was performed according to the
departmental dose-optimization program which includes automated
exposure control, adjustment of the mA and/or kV according to
patient size and/or use of iterative reconstruction technique.

[Series 3: t spine soft · axial · 0.31mm/px · z∈[+833,+1005]mm · 4 of 144 slices shown]
[im 29/144  soft-tissue]
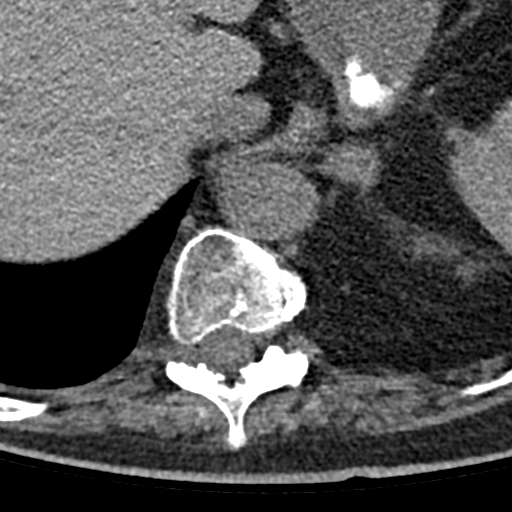
[im 58/144  soft-tissue]
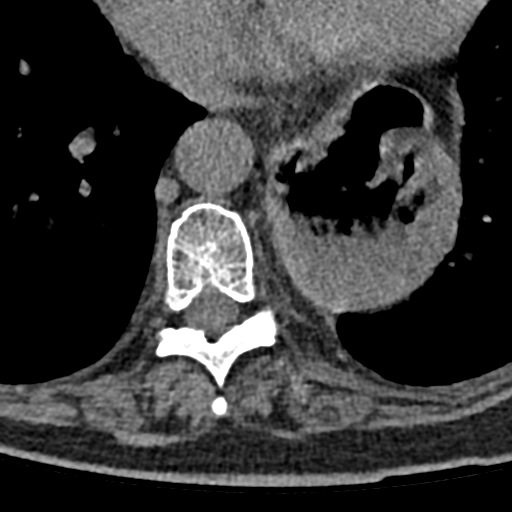
[im 86/144  soft-tissue]
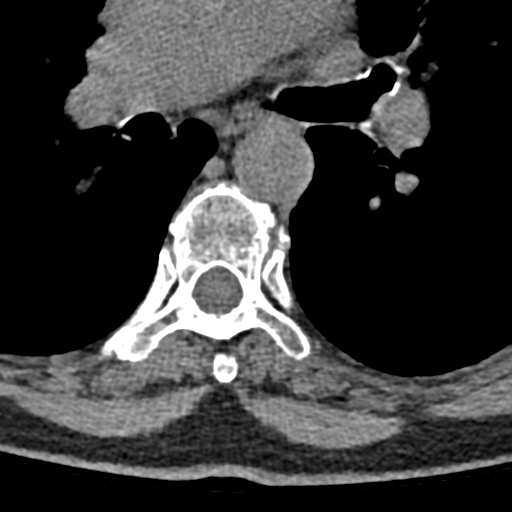
[im 115/144  soft-tissue]
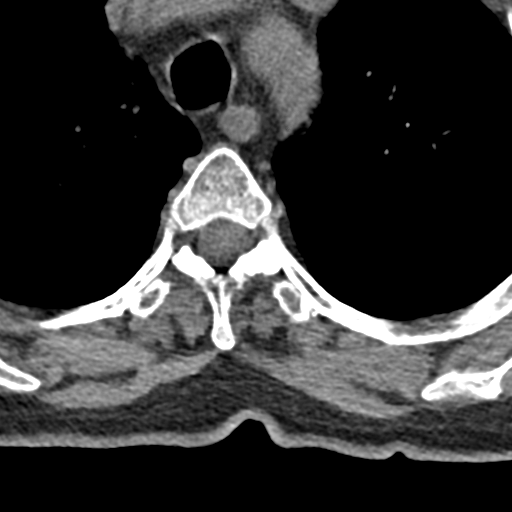

[Series 5: coronal bone · coronal · 0.31mm/px · 1 of 52 slices shown]
[im 26/52  bone]
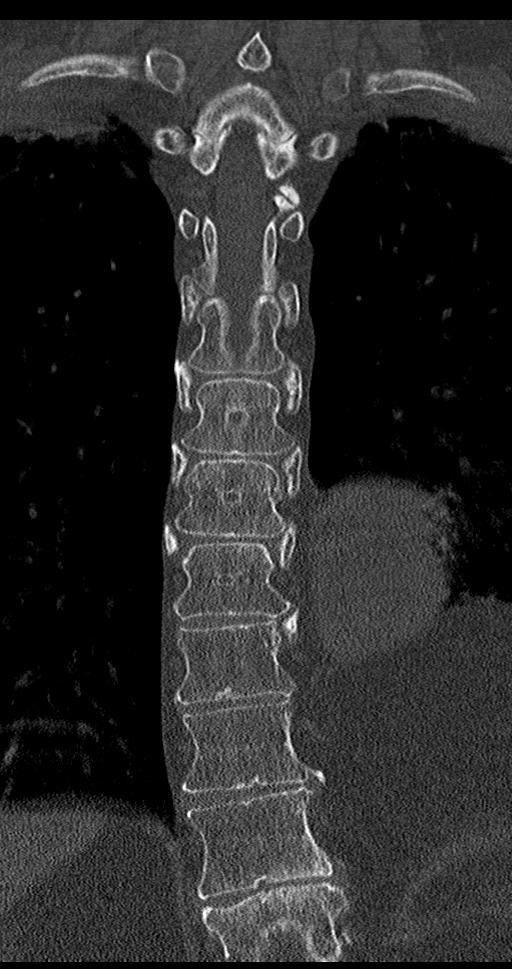

[Series 7: orthogonal axials · axial · 0.23mm/px · z∈[+823,+997]mm · 4 of 148 slices shown, 5 images]
[im 30/148  soft-tissue]
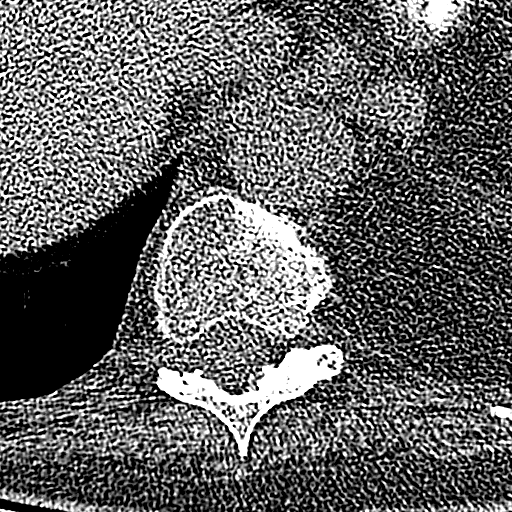
[im 30/148  bone]
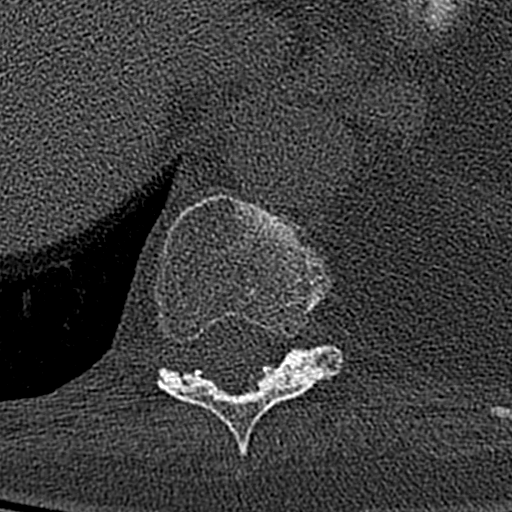
[im 59/148  bone]
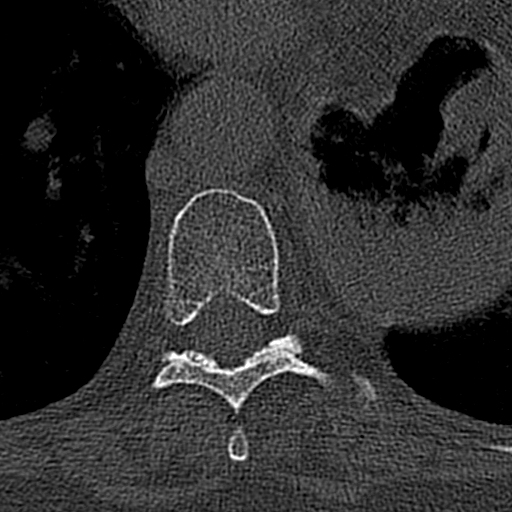
[im 89/148  bone]
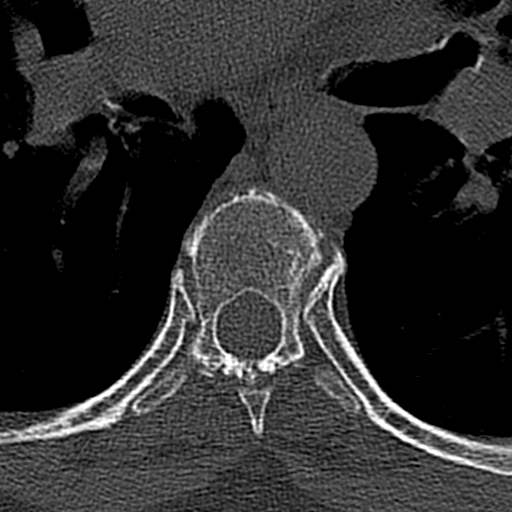
[im 118/148  bone]
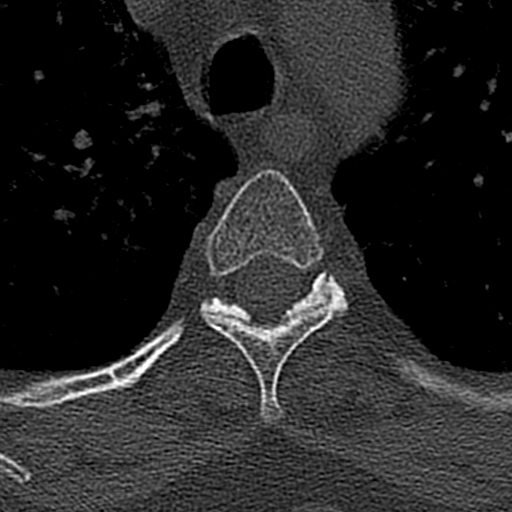

[Series 10: sagittal bone · sagittal · 0.18mm/px · 5 of 65 slices shown]
[im 11/65  bone]
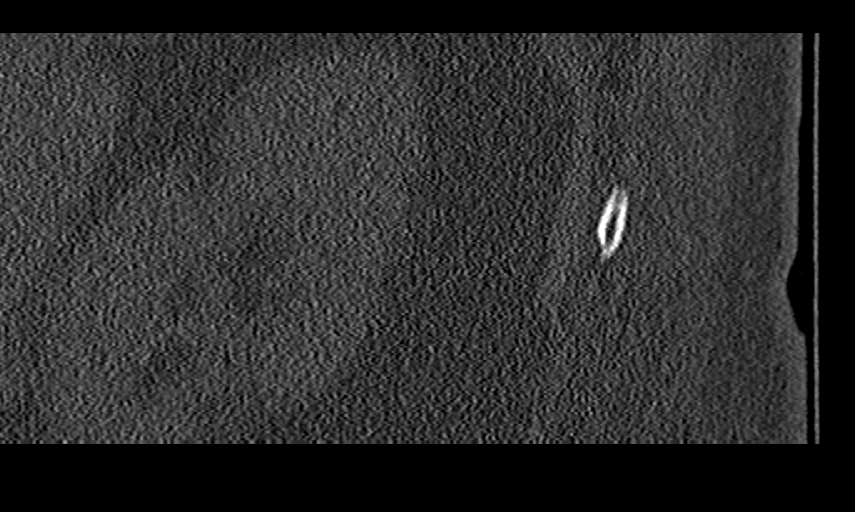
[im 22/65  bone]
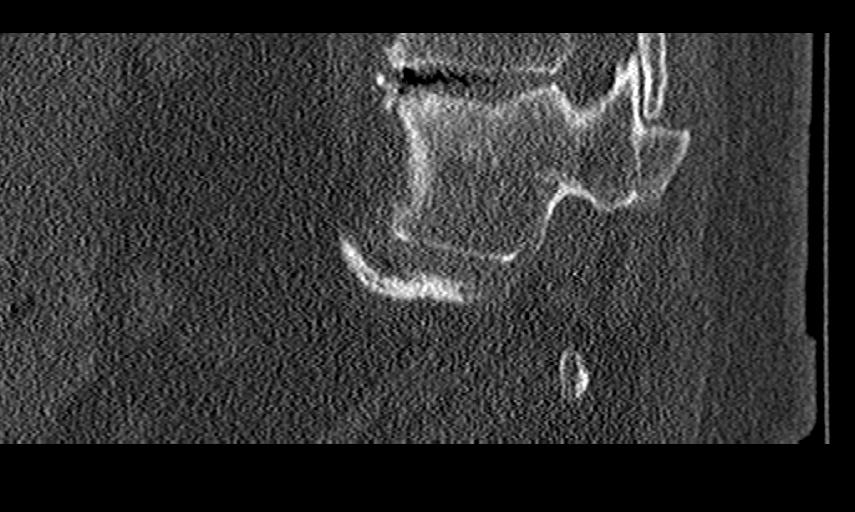
[im 33/65  bone]
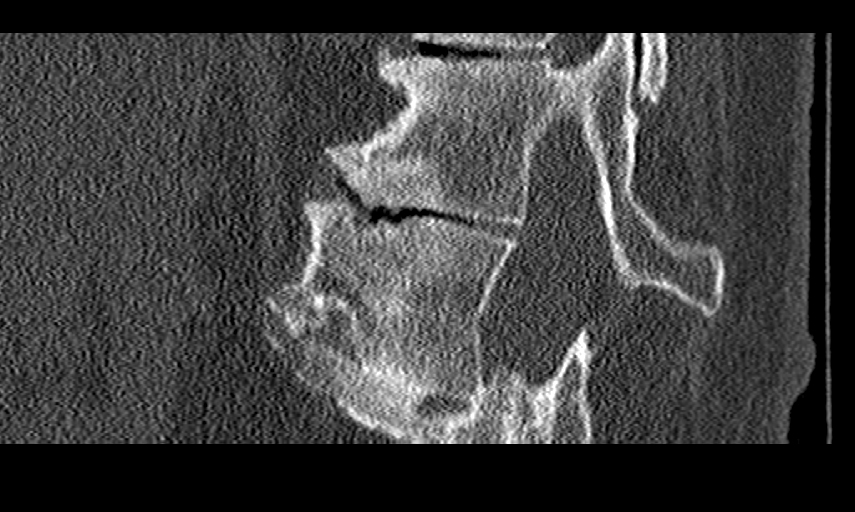
[im 43/65  bone]
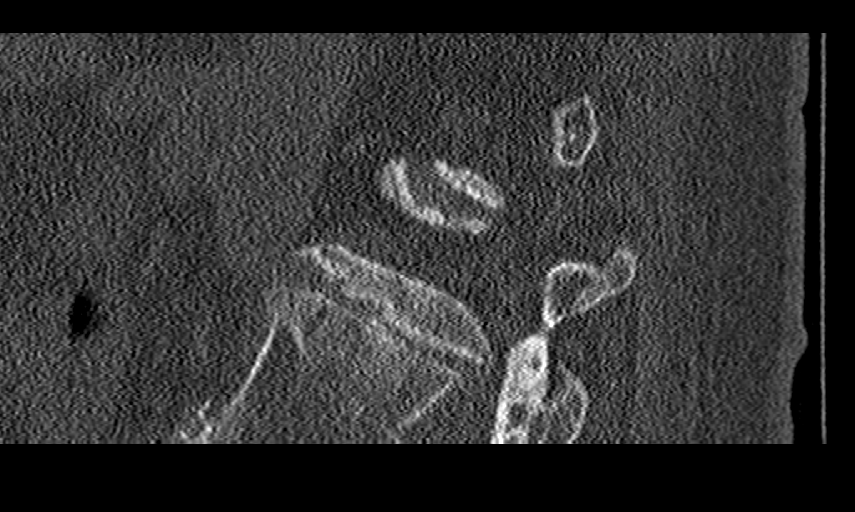
[im 54/65  bone]
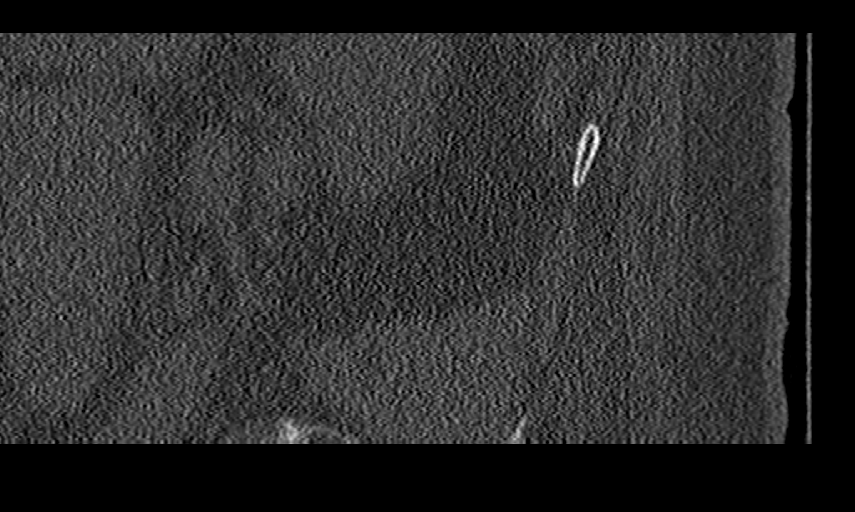

[14 of 33 positions shown; findings below may reference images not displayed]

FINDINGS: Alignment: Thoracic scoliosis centered at about T10 and convex
towards the right. Cobb angle measures about 12 degrees. Slight
anterior subluxation of T10 on T11 and T8 on T9. Alignment is not
changed since prior study.

Vertebrae: No vertebral compression deformities. No focal bone
lesion or bone destruction. Bone cortex appears intact. Visualized
portions of the ribs appear intact. No vertebral anomalies are
identified.

Paraspinal and other soft tissues: No abnormal paraspinal soft
tissue mass or infiltration. There is evidence of a large esophageal
hiatal and left Bochdalek's hernia with herniation of fat and
stomach. No change since prior study.

Disc levels: Degenerative changes throughout with narrowed disc
spaces and small endplate osteophyte formation. No gross bone
encroachment upon the central canal. Degenerative changes at the
costovertebral junctions.
IMPRESSION: 1. Thoracic scoliosis convex towards the right with Cobb angle of
about 12 degrees. No significant change.
2. No acute displaced fractures identified.
3. Incidental note of hiatal and left diaphragmatic hernia
containing stomach and fat without change.

## 2023-09-15 ENCOUNTER — Telehealth (HOSPITAL_COMMUNITY): Payer: PPO | Admitting: Psychiatry

## 2023-09-15 ENCOUNTER — Encounter (HOSPITAL_COMMUNITY): Payer: Self-pay | Admitting: Psychiatry

## 2023-09-15 DIAGNOSIS — G4733 Obstructive sleep apnea (adult) (pediatric): Secondary | ICD-10-CM | POA: Diagnosis not present

## 2023-09-15 DIAGNOSIS — F32A Depression, unspecified: Secondary | ICD-10-CM | POA: Diagnosis not present

## 2023-09-15 DIAGNOSIS — F419 Anxiety disorder, unspecified: Secondary | ICD-10-CM

## 2023-09-15 MED ORDER — CLONAZEPAM 0.5 MG PO TABS: 0.5000 mg | ORAL_TABLET | Freq: Every morning | ORAL | 1 refills | Status: DC

## 2023-09-15 MED ORDER — RISPERIDONE 1 MG PO TABS: 1.0000 mg | ORAL_TABLET | Freq: Every day | ORAL | 2 refills | Status: DC

## 2023-09-18 ENCOUNTER — Telehealth: Payer: Self-pay

## 2023-09-18 NOTE — Telephone Encounter (Addendum)
 Called patient to relay message below as per Dr. Mosetta Putt, patient voiced full understanding and had no further questions at this time.   ----- Message from Malachy Mood sent at 09/16/2023  3:38 PM EDT ----- Please let pt know her lab results, anemia has improved, iron level normal, no concerns, thanks   Malachy Mood

## 2023-09-19 DIAGNOSIS — M545 Low back pain, unspecified: Secondary | ICD-10-CM | POA: Diagnosis not present

## 2023-09-19 DIAGNOSIS — M542 Cervicalgia: Secondary | ICD-10-CM | POA: Diagnosis not present

## 2023-09-19 DIAGNOSIS — M4120 Other idiopathic scoliosis, site unspecified: Secondary | ICD-10-CM | POA: Diagnosis not present

## 2023-10-02 ENCOUNTER — Other Ambulatory Visit: Payer: PPO

## 2023-10-05 DIAGNOSIS — M5459 Other low back pain: Secondary | ICD-10-CM | POA: Diagnosis not present

## 2023-10-05 DIAGNOSIS — M4125 Other idiopathic scoliosis, thoracolumbar region: Secondary | ICD-10-CM | POA: Diagnosis not present

## 2023-10-10 ENCOUNTER — Other Ambulatory Visit: Payer: PPO

## 2023-10-11 ENCOUNTER — Ambulatory Visit
Admission: RE | Admit: 2023-10-11 | Discharge: 2023-10-11 | Disposition: A | Discharge status: Home / Self Care | Source: Ambulatory Visit | Attending: Hematology | Admitting: Hematology

## 2023-10-11 DIAGNOSIS — Z1589 Genetic susceptibility to other disease: Secondary | ICD-10-CM

## 2023-10-11 DIAGNOSIS — Z853 Personal history of malignant neoplasm of breast: Secondary | ICD-10-CM | POA: Diagnosis not present

## 2023-10-11 DIAGNOSIS — Z1239 Encounter for other screening for malignant neoplasm of breast: Secondary | ICD-10-CM | POA: Diagnosis not present

## 2023-10-11 MED ORDER — GADOPICLENOL 0.5 MMOL/ML IV SOLN
7.0000 mL | Freq: Once | INTRAVENOUS | Status: AC | PRN
  Administered 2023-10-11: 7 mL via INTRAVENOUS

## 2023-10-12 ENCOUNTER — Ambulatory Visit: Payer: PPO | Admitting: Family Medicine

## 2023-10-13 ENCOUNTER — Other Ambulatory Visit (HOSPITAL_COMMUNITY): Payer: Self-pay | Admitting: Psychiatry

## 2023-10-13 DIAGNOSIS — F419 Anxiety disorder, unspecified: Secondary | ICD-10-CM

## 2023-10-18 NOTE — Progress Notes (Unsigned)
 BH MD/PA/NP OP Progress Note  10/20/2023 9:56 AM Victoria [name]  MRN:  [id number]  Visit Diagnosis:    ICD-10-CM   1. Anxiety and depression  F41.9    F32.A     2. OSA (obstructive sleep apnea)  G47.33       Assessment: Victoria [name] is a 68 y.o. female with a history of anxiety, depression, OSA who presented to [facility] at [facility] for initial evaluation on 06/30/2022.  At initial evaluation patient reported symptoms of anxiety including feeling nervous or on edge, being unable to stop controlled worrying, worrying too much about different things, and fear that something awful would happen.  Patient's symptoms have been gradually progressing.  There was a period in November 2023 where she was having passive SI without intent or plan.  Patient also did endorse low mood, poor sleep, difficulty concentrating.  She does have a history of obstructive sleep apnea and does not use CPAP with last sleep study being over 10 years ago. Psychosocially patient has increased stress of caring for her adult son with cerebral palsy and planning the next steps for him after she and her husband pass or are no longer able to care for him.  Patient met criteria for MDD and generalized anxiety disorder.   Victoria [name] presents for follow-up evaluation. Today, 10/20/23, patient reports that    anxiety remained present inconsistent in the interim.  Notably patient is able to identify that current report might be based on a bit of a negative experience yesterday leading her to feel that anxiety is a bit worse than it has been.  The transition of her son to the group home did not go well though she is still getting used to not having him in the home.  Furthermore he is still getting used to the group home which is led to a couple concerns arising.  Patient plans to reach out to the supervisor to discuss some of these concerns which is why her anxiety is increased today.  Otherwise her only other  concern is a slight tremor in her neck.  This was not observed during interview today patient reports that it is sporadic lasting about 15 minutes and occurs a couple times a day.  The symptoms have occurred once in the past around 3 years ago and resolved on their own.  She denies any association with increased anxiety for when the symptoms onset.  We recommended continuing to monitor and observe symptoms to see if there is any correlation to increase anxiety.  If not suggested patient consult her primary care provider.  She does continue to take her medication consistently.  We will follow up in a month.  Psychotherapeutic interventions were used during today's session. From 10:03 AM to 10:26 AM we used empathic listening techniques and provided support. Used supportive interviewing techniques to validate patients feelings. Worked on cognitive re framing techniques and focusing on behavioral activation.  Improvement was evidenced by patient's participation.  Plan: - Continue Effexor  XR 225 mg QD - Continue Risperdal  0.5 mg Qam and 1 mg QHS  - Continue Doxepin  25 mg QHS - Continue Klonopin  0.5 mg QD prn for anxiety - Continue pindolol  5 mg every day - Started Semaglutide  in the interim managed by an online provider - Recommend repeat sleep study, could consider dental devices OSA still present - CMP, CBC, lipid profile, Vit D, A1c, TSH reviewed - Continue with therapy every week through [facility] therapy - Completed  a 10 week wellness group through the [facility], followed by a group for special needs parents - Discussed other support groups - Crisis resources reviewed - Follow up in a month  Chief Complaint:  Chief Complaint  Patient presents with   Follow-up   HPI: On presentation today Victoria [name] reported that     she is doing pretty good, other then a bit of nerves. The nerves are about Victoria [name] being in the new group home. Some things are not getting done which Victoria [name] is upset about. There are things like  range of motion exercises and hygiene that is not being kept up with. Victoria [name] is worried about bringing up with the supervisor. Overall they are pleased with the place, but disappointed that some of the personal care not being done.   It has taken some getting used to not having Victoria [name] in the home. Her husband has been keeping busy with a landscaping project during this time. Victoria [name] has been going over to the house to see Victoria [name]. She finds the home to be quiet. Discussed the importance of finding purpose in her life now that she has more free time at home. For now she has been focusing on house projects that had built up.   Thinks the anxiety is about the same, but just different reasons. We did explore this and she was able to identify that today may be a bit worse as she was with Victoria [name] yesterday when he was upset and crying.   She has noticed that her throat has been shaking when she sits still. Occurs when she sits still and stops when she is talking. It lasts about 15 minute every day and occurs a few times a day. This has been going on about a month. She had one prior incident a few years. The events do not seem to be related to any particular stressor. Last time this occurred she had a cardiac work up with nothing being found. The symptoms resolved on their own with time.  Reviewed potential causes including anxiety, cardiac, or neurologic reasons.  That being the case the anxiety side would be managed with her current medication regimen much adjustment.  Encouraged patient to continue to monitor symptoms and potentially discuss with her PCP if they continue.    Past Psychiatric History: One  prior psychiatric hospitalization to [facility] in February of 2024 due to increased depression/anxiety. No prior suicide attempts.  Patient did endorse passive SI in November of 2023 without any intent or plan.  She denies being connected with a psychiatrist in the past and had recently connected with a therapist at [facility]  therapy.  Has taken Effexor  (possible restlessness at 225), Cymbalta , Remeron , Ativan , Xanax , Klonopin , gabapentin , BuSpar , Zyprexa  (TD), Latuda  (akathisia), Risperdal , Doxepin ,   Denies substance use other than 1 beer a day. Can increase to 2 when the weather is nice.  Past Medical History:  Past Medical History:  Diagnosis Date   Allergic bronchopulmonary aspergillosis (HCC) 2008   sees Dr Loree Roe pulmonology   Anemia    iron  deficiency, resolved   Anxiety    Asthma    Breast cancer (HCC)    CAD (coronary artery disease)    a. LHC 6/16:  oOM1 60, pRCA 25 >> med Rx b. cath 3/19 2nd OM with 95% stenosis s/p synergy DES & anomalous RCA   CAP (community acquired pneumonia) 2016; 06/07/2016   Chronic bronchitis (HCC)    Chronic lower back pain    Complication of anesthesia    "  think I have a hard time waking up from it"   Depression    mild   Diverticulitis    Diverticulosis    GERD (gastroesophageal reflux disease)    H/O hiatal hernia    Headache    "weekly" (08/23/2017)   History of echocardiogram    Echo 6/16:  Mod LVH, EF 60-65%, no RWMA, Gr 1 DD, trivial MR, normal LA size.   History of radiation therapy    Left breast- 12/26/22-01/23/23-Dr. Retta Caster   Hyperglycemia 11/20/2015   Hyperlipidemia, mixed 09/11/2007   Qualifier: Diagnosis of  By: Tracy Friedlander   Did not tolerate Lipitor, zocor, Lovastatin, Pravastatin, Livalo , Crestor  even low dose    IBS (irritable bowel syndrome)    Maxillary sinusitis    Normal cardiac stress test 11/2011   No evidence of ischemia or infarct.   Calculated ejection fraction 72%.   Obesity    OSA (obstructive sleep apnea) 02/2012   has stopped using  cpap   Osteoarthritis    Osteoporosis    Personal history of radiation therapy    Pneumonia 11/2011   "before 2013 I hadn't had pneumonia since I was a child" (04/13/2012)   Pulmonary nodules    S/P angioplasty with stent 08/23/17 ostial 2nd OM with DES synnergy 08/24/2017    Schatzki's ring     Past Surgical History:  Procedure Laterality Date   ANTERIOR AND POSTERIOR REPAIR N/A 10/18/2022   Procedure: ANTERIOR (CYSTOCELE);  Surgeon: Thurman Flores, MD;  Location: [facility] OR;  Service: Gynecology;  Laterality: N/A;   APPENDECTOMY  1989   BREAST BIOPSY  11/03/2022   MM LT RADIOACTIVE SEED LOC MAMMO GUIDE 11/03/2022 [facility] MAMMOGRAPHY   BREAST LUMPECTOMY WITH RADIOACTIVE SEED LOCALIZATION Left 11/04/2022   Procedure: LEFT BREAST LUMPECTOMY WITH RADIOACTIVE SEED LOCALIZATION;  Surgeon: Caralyn Chandler, MD;  Location: [facility];  Service: General;  Laterality: Left;   CARDIAC CATHETERIZATION N/A 11/25/2014   Procedure: Right/Left Heart Cath and Coronary Angiography;  Surgeon: Arty Binning, MD;  Location: [facility] INVASIVE CV LAB;  Service: Cardiovascular;  Laterality: N/A;   CESAREAN SECTION  1985   COLONOSCOPY  03/2022   CORONARY ANGIOPLASTY WITH STENT PLACEMENT  08/23/2017   CORONARY STENT INTERVENTION N/A 08/23/2017   Procedure: CORONARY STENT INTERVENTION;  Surgeon: Odie Benne, MD;  Location: [facility] INVASIVE CV LAB;  Service: Cardiovascular;  Laterality: N/A;   HERNIA REPAIR  04/13/2012   VHR laparoscopic   LAPAROSCOPIC BILATERAL SALPINGO OOPHERECTOMY Bilateral 10/18/2022   Procedure: LAPAROTOMY WITH BILATERAL SALPINGO OOPHORECTOMY;  Surgeon: Thurman Flores, MD;  Location: [facility] OR;  Service: Gynecology;  Laterality: Bilateral;   LEFT HEART CATH AND CORONARY ANGIOGRAPHY N/A 08/23/2017   Procedure: LEFT HEART CATH AND CORONARY ANGIOGRAPHY;  Surgeon: Odie Benne, MD;  Location: [facility] INVASIVE CV LAB;  Service: Cardiovascular;  Laterality: N/A;   VAGINAL HYSTERECTOMY N/A 10/18/2022   Procedure: HYSTERECTOMY VAGINAL;  Surgeon: Thurman Flores, MD;  Location: [facility] OR;  Service: Gynecology;  Laterality: N/A;   VENTRAL HERNIA REPAIR  04/13/2012   Procedure: LAPAROSCOPIC VENTRAL HERNIA;  Surgeon: Levert Ready, MD;  Location: [facility] OR;  Service:  General;  Laterality: N/A;  laparoscopic repair of incarcerated hernia   Family History:  Family History  Problem Relation Age of Onset   Breast cancer Mother 38       bilateral breast cancer dx. 46, met to liver   Hypertension Mother    Diabetes Mother    Diverticulosis Father  Prostate cancer Father    Breast cancer Sister 36       DCIS at 32, IDC at 46, PALB2+   Cancer Sister        breast cancer, invasive ductal carcinoma in 2022,DCIS at 38 with 4 weeks of radiation, 5 years of Tamoxifen     Pulmonary embolism Brother        recurrent   Heart attack Maternal Grandfather    Ovarian cancer Paternal Grandmother    Cerebral palsy Son    Prostate cancer Paternal Uncle    Breast cancer Niece 75       PALB2+   Osteoporosis Niece    Stroke Neg Hx    Colon cancer Neg Hx    Esophageal cancer Neg Hx    Stomach cancer Neg Hx    Rectal cancer Neg Hx     Social History:  Social History   Socioeconomic History   Marital status: Married    Spouse name: Not on file   Number of children: 1   Years of education: 14   Highest education level: Associate degree: academic program  Occupational History   Occupation: Disabled   Tobacco Use   Smoking status: Never    Passive exposure: Never   Smokeless tobacco: Never  Vaping Use   Vaping status: Never Used  Substance and Sexual Activity   Alcohol use: Yes    Alcohol/week: 4.0 - 7.0 standard drinks of alcohol    Types: 4 - 7 Standard drinks or equivalent per week   Drug use: No   Sexual activity: Yes    Birth control/protection: Surgical    Comment: gluten free, lives with husband and son with CP quadriplegia  Other Topics Concern   Not on file  Social History Narrative   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    Lives at home with her husband and son.   Right-handed.   2 cups caffeine per day.   One story home   Social Drivers of Health   Financial Resource Strain: Low Risk  (01/18/2023)   Overall Financial Resource Strain  (CARDIA)    Difficulty of Paying Living Expenses: Not hard at all  Food Insecurity: No Food Insecurity (07/30/2023)   Hunger Vital Sign    Worried About Running Out of Food in the Last Year: Never true    Ran Out of Food in the Last Year: Never true  Transportation Needs: No Transportation Needs (07/30/2023)   PRAPARE - Administrator, Civil Service (Medical): No    Lack of Transportation (Non-Medical): No  Physical Activity: Inactive (02/10/2023)   Exercise Vital Sign    Days of Exercise per Week: 0 days    Minutes of Exercise per Session: 0 min  Stress: Stress Concern Present (01/18/2023)   [employer] of Occupational Health - Occupational Stress Questionnaire    Feeling of Stress : To some extent  Social Connections: Socially Integrated (07/29/2023)   Social Connection and Isolation Panel [NHANES]    Frequency of Communication with Friends and Family: More than three times a week    Frequency of Social Gatherings with Friends and Family: Once a week    Attends Religious Services: More than 4 times per year    Active Member of [employer] or Organizations: Yes    Attends Engineer, structural: More than 4 times per year    Marital Status: Married    Allergies:  Allergies  Allergen Reactions   Latuda  [Lurasidone  Hcl] Other (See  Comments)    PER THE PT, CAUSED RESTLESSNESS   Beclomethasone Dipropionate Hives and Other (See Comments)    Weight gain, also   Flexeril  [Cyclobenzaprine ] Anxiety   Mometasone  Furo-Formoterol Fum Hives and Other (See Comments)    Weight gain, also   Sulfonamide Derivatives Hives and Rash   Statins Other (See Comments)    Myalgias, RLS   Fluticasone  Rash and Other (See Comments)    "Weight gain," too    Current Medications: Current Outpatient Medications  Medication Sig Dispense Refill   acetaminophen  (TYLENOL ) 650 MG CR tablet Take 650-1,300 mg by mouth every 8 (eight) hours as needed for pain.     albuterol  (VENTOLIN  HFA) 108 (90  Base) MCG/ACT inhaler Inhale 1-2 puffs into the lungs every 6 (six) hours as needed. 8 g 2   apixaban  (ELIQUIS ) 5 MG TABS tablet Take 2 tablets (10 mg total) by mouth 2 (two) times daily for 6 days, THEN 1 tablet (5 mg total) 2 (two) times daily. 60 tablet 0   apixaban  (ELIQUIS ) 5 MG TABS tablet Take 1 tablet (5 mg total) by mouth 2 (two) times daily. 60 tablet 4   ascorbic acid (VITAMIN C) 500 MG tablet Take 500 mg by mouth every evening.     Benralizumab  (FASENRA  PEN) 30 MG/ML SOAJ Inject 1 mL (30 mg total) into the skin every 8 (eight) weeks. 1 mL 2   Calcium -Magnesium -Vitamin D  (CALCIUM  MAGNESIUM  PO) Take 1 tablet by mouth at bedtime.     Cholecalciferol  (VITAMIN D3 ULTRA STRENGTH) 125 MCG (5000 UT) capsule Take 5,000 Units by mouth every evening.     clonazePAM  (KLONOPIN ) 0.5 MG tablet Take 1 tablet (0.5 mg total) by mouth in the morning. 30 tablet 1   denosumab  (PROLIA ) 60 MG/ML SOSY injection Inject 60 mg into the skin every 6 (six) months.     doxepin  (SINEQUAN ) 25 MG capsule Take 1 capsule (25 mg total) by mouth at bedtime. 30 capsule 3   Evolocumab  (REPATHA  SURECLICK) 140 MG/ML SOAJ Inject 140 mg into the skin every 14 (fourteen) days. (Patient taking differently: Inject 140 mg into the skin every 21 ( twenty-one) days.) 2 mL 11   famotidine  (PEPCID ) 20 MG tablet TAKE 1 TABLET BY MOUTH TWICE A DAY (Patient taking differently: Take 20 mg by mouth every evening.) 60 tablet 11   ipratropium-albuterol  (DUONEB) 0.5-2.5 (3) MG/3ML SOLN Take 3 mLs by nebulization every 6 (six) hours as needed. (Patient taking differently: Take 3 mLs by nebulization every 6 (six) hours as needed (for shortness of breazth or wheezing).) 120 mL 5   nitroGLYCERIN  (NITROSTAT ) 0.4 MG SL tablet Place 1 tablet (0.4 mg total) under the tongue every 5 (five) minutes as needed for chest pain. 25 tablet 11   pindolol  (VISKEN ) 5 MG tablet Take 1 tablet (5 mg total) by mouth daily after breakfast. 30 tablet 3   risperiDONE   (RISPERDAL ) 0.5 MG tablet Take 1 tablet (0.5 mg total) by mouth daily. Take 1 mg Risperdal  tab at bedtime (Patient taking differently: Take 0.5 mg by mouth in the morning.) 60 tablet 2   risperiDONE  (RISPERDAL ) 1 MG tablet Take 1 tablet (1 mg total) by mouth at bedtime. 30 tablet 2   Semaglutide ,0.25 or 0.5MG /DOS, 2 MG/1.5ML SOPN Inject 0.25 mg into the skin every Friday.     venlafaxine  XR (EFFEXOR -XR) 75 MG 24 hr capsule TAKE 3 CAPSULES (225 MG TOTAL) BY MOUTH DAILY WITH BREAKFAST. 90 capsule 2   Current Facility-Administered Medications  Medication  Dose Route Frequency Provider Last Rate Last Admin   [START ON 11/06/2023] denosumab  (PROLIA ) injection 60 mg  60 mg Subcutaneous Once Blyth, Stacey A, MD         Psychiatric Specialty Exam: Review of Systems  There were no vitals taken for this visit.There is no height or weight on file to calculate BMI.  General Appearance: Well Groomed  Eye Contact:  Good  Speech:  Clear and Coherent and Normal Rate  Volume:  Normal  Mood:  Anxious and Euthymic  Affect:  Appropriate  Thought Process:  Coherent  Orientation:  Full (Time, Place, and Person)  Thought Content: Logical   Suicidal Thoughts:  No  Homicidal Thoughts:  No  Memory:  Immediate;   Good  Judgement:  Fair  Insight:  Fair  Psychomotor Activity:  Normal  Concentration:  Concentration: Good  Recall:  Good  Fund of Knowledge: Fair  Language: Good  Akathisia:  No    AIMS (if indicated): not done  Assets:  Communication Skills Desire for Improvement Financial Resources/Insurance Housing Transportation  ADL's:  Intact  Cognition: WNL  Sleep:  Good   Metabolic Disorder Labs: Lab Results  Component Value Date   HGBA1C 5.3 04/25/2023   MPG 117 10/22/2008   Lab Results  Component Value Date   PROLACTIN 15.4 08/12/2022   Lab Results  Component Value Date   CHOL 126 04/25/2023   TRIG 125.0 04/25/2023   HDL 63.20 04/25/2023   CHOLHDL 2 04/25/2023   VLDL 25.0  04/25/2023   LDLCALC 38 04/25/2023   LDLCALC 111 (H) 01/19/2023   Lab Results  Component Value Date   TSH 1.79 04/25/2023   TSH 1.76 01/19/2023    Therapeutic Level Labs: No results found for: "LITHIUM" No results found for: "VALPROATE" No results found for: "CBMZ"   Screenings: AIMS    Flowsheet Row Admission (Discharged) from 08/12/2022 in [facility]AL [facility]  AIMS Total Score 2      AUDIT    Flowsheet Row Admission (Discharged) from 08/12/2022 in [facility] BEHAVIORAL MEDIC[facility]  Alcohol Use Disorder Identification Test Final Score (AUDIT) 0      GAD-7    Flowsheet Row Video Visit from 10/13/2022 in [facility] Primary Care at [facility] Office Visit from 06/30/2022 in [facility] Office Visit from 05/26/2022 in [facility] Primary Care at [facility] Office Visit from 01/16/2017 in [facility] Primary Care at [facility]  Total GAD-7 Score 0 11 11 3       PHQ2-9    Flowsheet Row Clinical Support from 07/12/2023 in [facility] Clinical Support from 02/10/2023 in [facility] at [facility] Office Visit from 01/19/2023 in [facility] Care at [facility] Video Visit from 10/13/2022 in [facility] Primary Care at [facility] Telephone from 08/19/2022 in [facility] Community Care Coordination  PHQ-2 Total Score 0 0 0 0 2  PHQ-9 Total Score -- -- -- 0 --      Flowsheet Row ED to Hosp-Admission (Discharged) from 07/28/2023 in [facility]LO[facility]OLOG[facility] Admission (Discharged) from 11/04/2022 in [facility] Admission (Discharged) from 10/18/2022 in [facility]  C-SSRS RISK CATEGORY No Risk No Risk No Risk       Collaboration of Care: Collaboration of Care: Medication Management AEB medication prescription, Primary Care Provider AEB chart  review, and  Other provider involved in patient's care AEB Endocrinology chart review  Patient/Guardian was advised Release of Information must be obtained prior to any record release in order to collaborate their care with an outside provider. Patient/Guardian was advised if they have not already done so to contact the registration department to sign all necessary forms in order for us  to release information regarding their care.   Consent: Patient/Guardian gives verbal consent for treatment and assignment of benefits for services provided during this visit. Patient/Guardian expressed understanding and agreed to proceed.    Yves Herb, MD 10/20/2023, 9:56 AM   Virtual Visit via Video Note  I connected with Magdaleno Schooling on 10/20/23 at 10:00 AM EDT by a video enabled telemedicine application and verified that I am speaking with the correct person using two identifiers.  Location: Patient: Home Provider: Home Office   I discussed the limitations of evaluation and management by telemedicine and the availability of in person appointments. The patient expressed understanding and agreed to proceed.   I discussed the assessment and treatment plan with the patient. The patient was provided an opportunity to ask questions and all were answered. The patient agreed with the plan and demonstrated an understanding of the instructions.   The patient was advised to call back or seek an in-person evaluation if the symptoms worsen or if the condition fails to improve as anticipated.  I provided 20 minutes of non-face-to-face time during this encounter.   Yves Herb, MD

## 2023-10-20 ENCOUNTER — Encounter (HOSPITAL_COMMUNITY): Admitting: Psychiatry

## 2023-10-20 ENCOUNTER — Encounter (HOSPITAL_COMMUNITY): Payer: Self-pay

## 2023-10-20 NOTE — Progress Notes (Unsigned)
 BH MD/PA/NP OP Progress Note  10/23/2023 12:54 PM Victoria [name]  MRN:  [id number]  Visit Diagnosis:    ICD-10-CM   1. Anxiety and depression  F41.9 doxepin  (SINEQUAN ) 25 MG capsule   F32.A risperiDONE  (RISPERDAL ) 0.5 MG tablet    pindolol  (VISKEN ) 5 MG tablet    2. OSA (obstructive sleep apnea)  G47.33       Assessment: LARIYAH GLASPY is a 68 y.o. female with a history of anxiety, depression, OSA who presented to [facility] for initial evaluation on 06/30/2022.  At initial evaluation patient reported symptoms of anxiety including feeling nervous or on edge, being unable to stop controlled worrying, worrying too much about different things, and fear that something awful would happen.  Patient's symptoms have been gradually progressing.  There was a period in November 2023 where she was having passive SI without intent or plan.  Patient also did endorse low mood, poor sleep, difficulty concentrating.  She does have a history of obstructive sleep apnea and does not use CPAP with last sleep study being over 10 years ago. Psychosocially patient has increased stress of caring for her adult son with cerebral palsy and planning the next steps for him after she and her husband pass or are no longer able to care for him.  Patient met criteria for MDD and generalized anxiety disorder.   Victoria [name] presents for follow-up evaluation. Today, 10/23/23, patient reports that there has been some slight increase in her anxiety secondary to her son being hospitalized.  That said she is handling it fairly well and her anxiety has been more or less stable in the interim.  She has not noticed any further weight gain though does report decreased appetite after starting semaglutide .  She questions whether they lack of weight loss related to what she is eating since she is frequently at the hospital.  Notably patient did question whether taper and Risperdal  would be possible.  She has  experienced some affect blunting that was commented on on a recent family trip.  I explained that this is a potential side effect of the Risperdal .  That being the case we can consider a taper that would recommend waiting until the current stressor is resolved.  It has been 6 months since we last increased to this dose and patient has been fairly stable during this period.  Psychotherapeutic interventions were used during today's session. From 9:33 AM to 9:56 AM we used empathic listening techniques and provided support. Used supportive interviewing techniques to validate patients feelings. Worked on cognitive re framing techniques and focusing on behavioral activation.  Improvement was evidenced by patient's participation.  Plan: - Continue Effexor  XR 225 mg QD - Continue Risperdal  0.5 mg Qam and 1 mg at bedtime, will decrease to 0.5 mg BID in 2-4 weeks depending on  - Continue Doxepin  25 mg QHS - Continue Klonopin  0.5 mg QD prn for anxiety - Continue pindolol  5 mg every day - Started Semaglutide  in the interim managed by an online provider - Recommend repeat sleep study, could consider dental devices OSA still present - CMP, CBC, lipid profile, Vit D, A1c, TSH reviewed - Continue with therapy every week through [place] [facility] - Completed a 10 week wellness group through the [facility], followed by a group for special needs parents - Discussed other support groups - Crisis resources reviewed - Follow up in a month  Chief Complaint:  Chief Complaint  Patient presents with  Follow-up   HPI: On presentation today Victoria [name] reports that things have been a bit more stressful over the past couple weeks.  She notably had to reschedule her appointment last Friday as her son was brought in by ambulance to be admitted to the hospital.  He has been struggling with aspiration pneumonia and this is his third case since he moved into the group home.  Over the past few days Victoria [name] has spent every day at the hospital  while her husband has been at the nights there.  They are concerned about leaving him alone since he is unable to communicate if something were to occur.  They are hopeful that he is going to discharge today at which point he will return home with Stana Ear and then they would transition back to the group home in a few days.  Victoria [name] notes some anxiety that the aspiration pneumonia is related to the group home however is hopeful that is more coincidental since her son likes it there.  While her anxiety has been increased the past few weeks overall she notes that it is stable if not improved.  The minor increase in anxiety right now she thinks is tolerable and will improve once her son is stable.  She also reports that moods been stable during this time.  She has not noticed any further weight gain in the interim and is still taking the semaglutide .  On that front she denies any loss in weight.  She has however noticed a decrease in appetite and wonders if the lack of weight loss is related to the poor diet she is eating since her son has been in the hospital more frequently.  Patient did question about emotional blunting.  She had gone to the beach with her family over Easter and had a good time.  While there however her sister made a comment that she was more quiet than usual.  On reflecting back Victoria [name] reports that she can identify times where she laughed and was more engaged but there is some more flatness than she had in the past.   She was wondering if potential decrease in Risperdal  could help with this and we discussed this with the patient.  Reviewed adverse side effects of Risperdal  as well as the benefits of the medication.  Notably we had tried to decrease roughly 6 months ago which patient had been unable to tolerate.  She and she has been stable for 6 months that would be appropriate to trial a taper to 0.5 twice a day again however we recommended waiting until after this current stressor resolved.   Patient was open to this and we will taper up to 0.5 twice daily in 2 to 4 weeks pending her sons recivery.  Past Psychiatric History: One  prior psychiatric hospitalization to [facility] in February of 2024 due to increased depression/anxiety. No prior suicide attempts.  Patient did endorse passive SI in November of 2023 without any intent or plan.  She denies being connected with a psychiatrist in the past and had recently connected with a therapist at [facility].  Has taken Effexor  (possible restlessness at 225), Cymbalta , Remeron , Ativan , Xanax , Klonopin , gabapentin , BuSpar , Zyprexa  (TD), Latuda  (akathisia), Risperdal , Doxepin ,   Denies substance use other than 1 beer a day. Can increase to 2 when the weather is nice.  Past Medical History:  Past Medical History:  Diagnosis Date   Allergic bronchopulmonary aspergillosis (HCC) 2008   sees Dr Loree Roe pulmonology   Anemia  iron  deficiency, resolved   Anxiety    Asthma    Breast cancer (HCC)    CAD (coronary artery disease)    a. LHC 6/16:  oOM1 60, pRCA 25 >> med Rx b. cath 3/19 2nd OM with 95% stenosis s/p synergy DES & anomalous RCA   CAP (community acquired pneumonia) 2016; 06/07/2016   Chronic bronchitis (HCC)    Chronic lower back pain    Complication of anesthesia    "think I have a hard time waking up from it"   Depression    mild   Diverticulitis    Diverticulosis    GERD (gastroesophageal reflux disease)    H/O hiatal hernia    Headache    "weekly" (08/23/2017)   History of echocardiogram    Echo 6/16:  Mod LVH, EF 60-65%, no RWMA, Gr 1 DD, trivial MR, normal LA size.   History of radiation therapy    Left breast- 12/26/22-01/23/23-Dr. Retta Caster   Hyperglycemia 11/20/2015   Hyperlipidemia, mixed 09/11/2007   Qualifier: Diagnosis of  By: Tracy Friedlander   Did not tolerate Lipitor, zocor, Lovastatin, Pravastatin, Livalo , Crestor  even low dose    IBS (irritable bowel syndrome)    Maxillary sinusitis     Normal cardiac stress test 11/2011   No evidence of ischemia or infarct.   Calculated ejection fraction 72%.   Obesity    OSA (obstructive sleep apnea) 02/2012   has stopped using  cpap   Osteoarthritis    Osteoporosis    Personal history of radiation therapy    Pneumonia 11/2011   "before 2013 I hadn't had pneumonia since I was a child" (04/13/2012)   Pulmonary nodules    S/P angioplasty with stent 08/23/17 ostial 2nd OM with DES synnergy 08/24/2017   Schatzki's ring     Past Surgical History:  Procedure Laterality Date   ANTERIOR AND POSTERIOR REPAIR N/A 10/18/2022   Procedure: ANTERIOR (CYSTOCELE);  Surgeon: Thurman Flores, MD;  Location: [facility] OR;  Service: Gynecology;  Laterality: N/A;   APPENDECTOMY  1989   BREAST BIOPSY  11/03/2022   MM LT RADIOACTIVE SEED LOC MAMMO GUIDE 11/03/2022 [facility] MAMMOGRAPHY   BREAST LUMPECTOMY WITH RADIOACTIVE SEED LOCALIZATION Left 11/04/2022   Procedure: LEFT BREAST LUMPECTOMY WITH RADIOACTIVE SEED LOCALIZATION;  Surgeon: Caralyn Chandler, MD;  Location: [facility];  Service: General;  Laterality: Left;   CARDIAC CATHETERIZATION N/A 11/25/2014   Procedure: Right/Left Heart Cath and Coronary Angiography;  Surgeon: Arty Binning, MD;  Location: [facility] INVASIVE CV LAB;  Service: Cardiovascular;  Laterality: N/A;   CESAREAN SECTION  1985   COLONOSCOPY  03/2022   CORONARY ANGIOPLASTY WITH STENT PLACEMENT  08/23/2017   CORONARY STENT INTERVENTION N/A 08/23/2017   Procedure: CORONARY STENT INTERVENTION;  Surgeon: Odie Benne, MD;  Location: [facility] INVASIVE CV LAB;  Service: Cardiovascular;  Laterality: N/A;   HERNIA REPAIR  04/13/2012   VHR laparoscopic   LAPAROSCOPIC BILATERAL SALPINGO OOPHERECTOMY Bilateral 10/18/2022   Procedure: LAPAROTOMY WITH BILATERAL SALPINGO OOPHORECTOMY;  Surgeon: Thurman Flores, MD;  Location: [facility] OR;  Service: Gynecology;  Laterality: Bilateral;   LEFT HEART CATH AND CORONARY ANGIOGRAPHY N/A 08/23/2017    Procedure: LEFT HEART CATH AND CORONARY ANGIOGRAPHY;  Surgeon: Odie Benne, MD;  Location: [facility] INVASIVE CV LAB;  Service: Cardiovascular;  Laterality: N/A;   VAGINAL HYSTERECTOMY N/A 10/18/2022   Procedure: HYSTERECTOMY VAGINAL;  Surgeon: Thurman Flores, MD;  Location: [facility] OR;  Service: Gynecology;  Laterality: N/A;   VENTRAL  HERNIA REPAIR  04/13/2012   Procedure: LAPAROSCOPIC VENTRAL HERNIA;  Surgeon: Levert Ready, MD;  Location: [facility] OR;  Service: General;  Laterality: N/A;  laparoscopic repair of incarcerated hernia   Family History:  Family History  Problem Relation Age of Onset   Breast cancer Mother 59       bilateral breast cancer dx. 12, met to liver   Hypertension Mother    Diabetes Mother    Diverticulosis Father    Prostate cancer Father    Breast cancer Sister 26       DCIS at 56, IDC at 65, PALB2+   Cancer Sister        breast cancer, invasive ductal carcinoma in 2022,DCIS at 18 with 4 weeks of radiation, 5 years of Tamoxifen     Pulmonary embolism Brother        recurrent   Heart attack Maternal Grandfather    Ovarian cancer Paternal Grandmother    Cerebral palsy Son    Prostate cancer Paternal Uncle    Breast cancer Niece 49       PALB2+   Osteoporosis Niece    Stroke Neg Hx    Colon cancer Neg Hx    Esophageal cancer Neg Hx    Stomach cancer Neg Hx    Rectal cancer Neg Hx     Social History:  Social History   Socioeconomic History   Marital status: Married    Spouse name: Not on file   Number of children: 1   Years of education: 14   Highest education level: Associate degree: academic program  Occupational History   Occupation: Disabled   Tobacco Use   Smoking status: Never    Passive exposure: Never   Smokeless tobacco: Never  Vaping Use   Vaping status: Never Used  Substance and Sexual Activity   Alcohol use: Yes    Alcohol/week: 4.0 - 7.0 standard drinks of alcohol    Types: 4 - 7 Standard drinks or equivalent per week   Drug use:  No   Sexual activity: Yes    Birth control/protection: Surgical    Comment: gluten free, lives with husband and son with CP quadriplegia  Other Topics Concern   Not on file  Social History Narrative   Cares for son with cerebral palsy.    Lives at home with her husband and son.   Right-handed.   2 cups caffeine per day.   One story home   Social Drivers of Health   Financial Resource Strain: Low Risk  (01/18/2023)   Overall Financial Resource Strain (CARDIA)    Difficulty of Paying Living Expenses: Not hard at all  Food Insecurity: No Food Insecurity (07/30/2023)   Hunger Vital Sign    Worried About Running Out of Food in the Last Year: Never true    Ran Out of Food in the Last Year: Never true  Transportation Needs: No Transportation Needs (07/30/2023)   PRAPARE - Administrator, Civil Service (Medical): No    Lack of Transportation (Non-Medical): No  Physical Activity: Inactive (02/10/2023)   Exercise Vital Sign    Days of Exercise per Week: 0 days    Minutes of Exercise per Session: 0 min  Stress: Stress Concern Present (01/18/2023)   [employer] of Occupational Health - Occupational Stress Questionnaire    Feeling of Stress : To some extent  Social Connections: Socially Integrated (07/29/2023)   Social Connection and Isolation Panel [NHANES]    Frequency of Communication  with Friends and Family: More than three times a week    Frequency of Social Gatherings with Friends and Family: Once a week    Attends Religious Services: More than 4 times per year    Active Member of [employer] or Organizations: Yes    Attends Engineer, structural: More than 4 times per year    Marital Status: Married    Allergies:  Allergies  Allergen Reactions   Latuda  [Lurasidone  Hcl] Other (See Comments)    PER THE PT, CAUSED RESTLESSNESS   Beclomethasone Dipropionate Hives and Other (See Comments)    Weight gain, also   Flexeril  [Cyclobenzaprine ] Anxiety   Mometasone   Furo-Formoterol Fum Hives and Other (See Comments)    Weight gain, also   Sulfonamide Derivatives Hives and Rash   Statins Other (See Comments)    Myalgias, RLS   Fluticasone  Rash and Other (See Comments)    "Weight gain," too    Current Medications: Current Outpatient Medications  Medication Sig Dispense Refill   acetaminophen  (TYLENOL ) 650 MG CR tablet Take 650-1,300 mg by mouth every 8 (eight) hours as needed for pain.     albuterol  (VENTOLIN  HFA) 108 (90 Base) MCG/ACT inhaler Inhale 1-2 puffs into the lungs every 6 (six) hours as needed. 8 g 2   apixaban  (ELIQUIS ) 5 MG TABS tablet Take 2 tablets (10 mg total) by mouth 2 (two) times daily for 6 days, THEN 1 tablet (5 mg total) 2 (two) times daily. 60 tablet 0   apixaban  (ELIQUIS ) 5 MG TABS tablet Take 1 tablet (5 mg total) by mouth 2 (two) times daily. 60 tablet 4   ascorbic acid (VITAMIN C) 500 MG tablet Take 500 mg by mouth every evening.     Benralizumab  (FASENRA  PEN) 30 MG/ML SOAJ Inject 1 mL (30 mg total) into the skin every 8 (eight) weeks. 1 mL 2   Calcium -Magnesium -Vitamin D  (CALCIUM  MAGNESIUM  PO) Take 1 tablet by mouth at bedtime.     Cholecalciferol  (VITAMIN D3 ULTRA STRENGTH) 125 MCG (5000 UT) capsule Take 5,000 Units by mouth every evening.     clonazePAM  (KLONOPIN ) 0.5 MG tablet Take 1 tablet (0.5 mg total) by mouth in the morning. 30 tablet 1   denosumab  (PROLIA ) 60 MG/ML SOSY injection Inject 60 mg into the skin every 6 (six) months.     doxepin  (SINEQUAN ) 25 MG capsule Take 1 capsule (25 mg total) by mouth at bedtime. 30 capsule 3   Evolocumab  (REPATHA  SURECLICK) 140 MG/ML SOAJ Inject 140 mg into the skin every 14 (fourteen) days. (Patient taking differently: Inject 140 mg into the skin every 21 ( twenty-one) days.) 2 mL 11   famotidine  (PEPCID ) 20 MG tablet TAKE 1 TABLET BY MOUTH TWICE A DAY (Patient taking differently: Take 20 mg by mouth every evening.) 60 tablet 11   ipratropium-albuterol  (DUONEB) 0.5-2.5 (3) MG/3ML  SOLN Take 3 mLs by nebulization every 6 (six) hours as needed. (Patient taking differently: Take 3 mLs by nebulization every 6 (six) hours as needed (for shortness of breazth or wheezing).) 120 mL 5   nitroGLYCERIN  (NITROSTAT ) 0.4 MG SL tablet Place 1 tablet (0.4 mg total) under the tongue every 5 (five) minutes as needed for chest pain. 25 tablet 11   pindolol  (VISKEN ) 5 MG tablet Take 1 tablet (5 mg total) by mouth daily after breakfast. 30 tablet 3   risperiDONE  (RISPERDAL ) 0.5 MG tablet Take 1 tablet (0.5 mg total) by mouth 2 (two) times daily. Will start the 0.5 mg  BID dosing in 2 weeks 60 tablet 2   risperiDONE  (RISPERDAL ) 1 MG tablet Take 1 tablet (1 mg total) by mouth at bedtime. 30 tablet 2   Semaglutide ,0.25 or 0.5MG /DOS, 2 MG/1.5ML SOPN Inject 0.25 mg into the skin every Friday.     venlafaxine  XR (EFFEXOR -XR) 75 MG 24 hr capsule TAKE 3 CAPSULES (225 MG TOTAL) BY MOUTH DAILY WITH BREAKFAST. 90 capsule 2   Current Facility-Administered Medications  Medication Dose Route Frequency Provider Last Rate Last Admin   [START ON 11/06/2023] denosumab  (PROLIA ) injection 60 mg  60 mg Subcutaneous Once Blyth, Stacey A, MD         Psychiatric Specialty Exam: Review of Systems  There were no vitals taken for this visit.There is no height or weight on file to calculate BMI.  General Appearance: Well Groomed  Eye Contact:  Good  Speech:  Clear and Coherent and Normal Rate  Volume:  Normal  Mood:  Anxious and Euthymic  Affect:  Appropriate  Thought Process:  Coherent  Orientation:  Full (Time, Place, and Person)  Thought Content: Logical   Suicidal Thoughts:  No  Homicidal Thoughts:  No  Memory:  Immediate;   Good  Judgement:  Fair  Insight:  Fair  Psychomotor Activity:  Normal  Concentration:  Concentration: Good  Recall:  Good  Fund of Knowledge: Fair  Language: Good  Akathisia:  No    AIMS (if indicated): not done  Assets:  Communication Skills Desire for Improvement Financial  Resources/Insurance Housing Transportation  ADL's:  Intact  Cognition: WNL  Sleep:  Good   Metabolic Disorder Labs: Lab Results  Component Value Date   HGBA1C 5.3 04/25/2023   MPG 117 10/22/2008   Lab Results  Component Value Date   PROLACTIN 15.4 08/12/2022   Lab Results  Component Value Date   CHOL 126 04/25/2023   TRIG 125.0 04/25/2023   HDL 63.20 04/25/2023   CHOLHDL 2 04/25/2023   VLDL 25.0 04/25/2023   LDLCALC 38 04/25/2023   LDLCALC 111 (H) 01/19/2023   Lab Results  Component Value Date   TSH 1.79 04/25/2023   TSH 1.76 01/19/2023    Therapeutic Level Labs: No results found for: "LITHIUM" No results found for: "VALPROATE" No results found for: "CBMZ"   Screenings: AIMS    Flowsheet Row Admission (Discharged) from 08/12/2022 in [facility]  AIMS Total Score 2      AUDIT    Flowsheet Row Admission (Discharged) from 08/12/2022 in [facility] BEHAVIORAL MEDICINE  Alcohol Use Disorder Identification Test Final Score (AUDIT) 0      GAD-7    Flowsheet Row Video Visit from 10/13/2022 in [facility] Office Visit from 06/30/2022 in [facility] Office Visit from 05/26/2022 in [facility] at [facility] Office Visit from 01/16/2017 in [facility] at [facility]  Total GAD-7 Score 0 11 11 3       PHQ2-9    Flowsheet Row Clinical Support from 07/12/2023 in [facility] Clinical Support from 02/10/2023 in [facility] at [facility] Office Visit from 01/19/2023 in [facility] Care at [facility] Video Visit from 10/13/2022 in [facility] Primary Care at [facility] Telephone from 08/19/2022 in [facility] Community Care Coordination  PHQ-2 Total Score 0 0 0 0 2  PHQ-9 Total Score -- -- --  0 --       Flowsheet Row ED to Hosp-Admission (Discharged) from 07/28/2023 in [facility] Admission (Discharged) from 11/04/2022 in [facility] Admission (Discharged) from 10/18/2022 in [facility]  C-SSRS RISK CATEGORY No Risk No Risk No Risk       Collaboration of Care: Collaboration of Care: Medication Management AEB medication prescription, Primary Care Provider AEB chart review, and Other provider involved in patient's care AEB Endocrinology chart review  Patient/Guardian was advised Release of Information must be obtained prior to any record release in order to collaborate their care with an outside provider. Patient/Guardian was advised if they have not already done so to contact the registration department to sign all necessary forms in order for us  to release information regarding their care.   Consent: Patient/Guardian gives verbal consent for treatment and assignment of benefits for services provided during this visit. Patient/Guardian expressed understanding and agreed to proceed.    Yves Herb, MD 10/23/2023, 12:54 PM   Virtual Visit via Video Note  I connected with Victoria [name] on 10/23/23 at  9:30 AM EDT by a video enabled telemedicine application and verified that I am speaking with the correct person using two identifiers.  Location: Patient: Home Provider: Home Office   I discussed the limitations of evaluation and management by telemedicine and the availability of in person appointments. The patient expressed understanding and agreed to proceed.   I discussed the assessment and treatment plan with the patient. The patient was provided an opportunity to ask questions and all were answered. The patient agreed with the plan and demonstrated an understanding of the instructions.   The patient was advised to call back or seek an in-person evaluation if the symptoms worsen or if the condition fails to improve as anticipated.  I  provided 20 minutes of non-face-to-face time during this encounter.   Yves Herb, MD

## 2023-10-22 NOTE — Assessment & Plan Note (Signed)
 hgba1c acceptable, minimize simple carbs. Increase exercise as tolerated. Consider Prevnar 20, Arexvy and covid and flu boosters annually

## 2023-10-22 NOTE — Assessment & Plan Note (Signed)
 Working with pulmonary, stable on Fasenra  and current meds

## 2023-10-22 NOTE — Assessment & Plan Note (Signed)
 Encourage heart healthy diet such as MIND or DASH diet, increase exercise, avoid trans fats, simple carbohydrates and processed foods, consider a krill or fish or flaxseed oil cap daily.

## 2023-10-22 NOTE — Assessment & Plan Note (Signed)
 Significant stress with her son in the hospital

## 2023-10-22 NOTE — Assessment & Plan Note (Signed)
Tolerating Repatha °

## 2023-10-23 ENCOUNTER — Telehealth (HOSPITAL_BASED_OUTPATIENT_CLINIC_OR_DEPARTMENT_OTHER): Admitting: Psychiatry

## 2023-10-23 ENCOUNTER — Encounter (HOSPITAL_COMMUNITY): Payer: Self-pay | Admitting: Psychiatry

## 2023-10-23 ENCOUNTER — Other Ambulatory Visit: Payer: PPO

## 2023-10-23 DIAGNOSIS — G4733 Obstructive sleep apnea (adult) (pediatric): Secondary | ICD-10-CM

## 2023-10-23 DIAGNOSIS — F419 Anxiety disorder, unspecified: Secondary | ICD-10-CM

## 2023-10-23 DIAGNOSIS — F32A Depression, unspecified: Secondary | ICD-10-CM | POA: Diagnosis not present

## 2023-10-23 MED ORDER — PINDOLOL 5 MG PO TABS: 5.0000 mg | ORAL_TABLET | Freq: Every day | ORAL | 3 refills | Status: DC

## 2023-10-23 MED ORDER — DOXEPIN HCL 25 MG PO CAPS: 25.0000 mg | ORAL_CAPSULE | Freq: Every day | ORAL | 3 refills | Status: DC

## 2023-10-23 MED ORDER — RISPERIDONE 0.5 MG PO TABS
0.5000 mg | ORAL_TABLET | Freq: Two times a day (BID) | ORAL | 2 refills | Status: DC
Start: 2023-10-23 — End: 2023-12-21

## 2023-10-23 NOTE — Progress Notes (Signed)
 This encounter was created in error - please disregard.  Patients appointment was rescheduled as she was in the ER with her son.

## 2023-10-24 ENCOUNTER — Encounter: Payer: Self-pay | Admitting: Family Medicine

## 2023-10-24 ENCOUNTER — Telehealth (INDEPENDENT_AMBULATORY_CARE_PROVIDER_SITE_OTHER): Admitting: Family Medicine

## 2023-10-24 DIAGNOSIS — T466X5A Adverse effect of antihyperlipidemic and antiarteriosclerotic drugs, initial encounter: Secondary | ICD-10-CM | POA: Diagnosis not present

## 2023-10-24 DIAGNOSIS — M545 Low back pain, unspecified: Secondary | ICD-10-CM | POA: Diagnosis not present

## 2023-10-24 DIAGNOSIS — G720 Drug-induced myopathy: Secondary | ICD-10-CM

## 2023-10-24 DIAGNOSIS — M810 Age-related osteoporosis without current pathological fracture: Secondary | ICD-10-CM | POA: Diagnosis not present

## 2023-10-24 DIAGNOSIS — F419 Anxiety disorder, unspecified: Secondary | ICD-10-CM

## 2023-10-24 DIAGNOSIS — F32A Depression, unspecified: Secondary | ICD-10-CM

## 2023-10-24 DIAGNOSIS — J4541 Moderate persistent asthma with (acute) exacerbation: Secondary | ICD-10-CM | POA: Diagnosis not present

## 2023-10-24 DIAGNOSIS — E782 Mixed hyperlipidemia: Secondary | ICD-10-CM

## 2023-10-24 DIAGNOSIS — R739 Hyperglycemia, unspecified: Secondary | ICD-10-CM | POA: Diagnosis not present

## 2023-10-24 DIAGNOSIS — I5032 Chronic diastolic (congestive) heart failure: Secondary | ICD-10-CM

## 2023-10-24 DIAGNOSIS — M4120 Other idiopathic scoliosis, site unspecified: Secondary | ICD-10-CM | POA: Diagnosis not present

## 2023-10-24 DIAGNOSIS — M542 Cervicalgia: Secondary | ICD-10-CM | POA: Diagnosis not present

## 2023-10-24 NOTE — Progress Notes (Signed)
 MyChart Video Visit    Virtual Visit via Video Note   This patient is at least at moderate risk for complications without adequate follow up. This format is felt to be most appropriate for this patient at this time. Physical exam was limited by quality of the video and audio technology used for the visit. Porsha, CMA was able to get the patient set up on a video visit.  Patient location: home Patient and provider in visit Provider location: Office  I discussed the limitations of evaluation and management by telemedicine and the availability of in person appointments. The patient expressed understanding and agreed to proceed.  Visit Date: 10/24/2023  Today's healthcare provider: Randie Bustle, MD     Subjective:    Patient ID: Victoria [name], female    DOB: 30-Nov-1955, 68 y.o.   MRN: [id number]  No chief complaint on file.   HPI Discussed the use of AI scribe software for clinical note transcription with the patient, who gave verbal consent to proceed.  History of Present Illness Victoria [name] is a 68 year old female who presents for a follow-up visit.  She recently had a psychiatry visit where it was discussed to adjust her risperidone  dosage. She currently takes 1 mg at bedtime and plans to reduce it to 0.5 mg twice daily after her upcoming anniversary trip. She feels stable on her current medication regimen but noted her sister commented on her being 'kind of flat' during a family gathering.  She started semaglutide  in January for weight management, initially at 10 ml, then increased to 20 ml, and currently at 30 ml. Despite not losing weight, she feels her portions are smaller and she feels fuller sooner. She has not experienced gastrointestinal side effects such as nausea or diarrhea.  An endocrinology consultation on March 24th, prompted by her orthopedic surgeon, revealed a vitamin D  level of 95. Endocrinology has advised her to stop Vit D supplementation which was at 5000  international units daily and ortho has told her to continue. For now she has been off vitamin D  since March 24th, except for a small amount in her calcium  supplement.  She mentions a persistent purple band above her right ankle, present for over a year, which was suggested to be varicose veins. It is not painful, red, or hot, and she plans to have it evaluated in July.  Her blood work from March indicated low protein levels, which have been improving. She is advised to continue monitoring her protein intake.  No recent illness, stomach trouble, heartburn, or bowel issues. No gastrointestinal upset with semaglutide  use.    Past Medical History:  Diagnosis Date   Allergic bronchopulmonary aspergillosis (HCC) 2008   sees Dr Loree Roe pulmonology   Anemia    iron  deficiency, resolved   Anxiety    Asthma    Breast cancer (HCC)    CAD (coronary artery disease)    a. LHC 6/16:  oOM1 60, pRCA 25 >> med Rx b. cath 3/19 2nd OM with 95% stenosis s/p synergy DES & anomalous RCA   CAP (community acquired pneumonia) 2016; 06/07/2016   Chronic bronchitis (HCC)    Chronic lower back pain    Complication of anesthesia    "think I have a hard time waking up from it"   Depression    mild   Diverticulitis    Diverticulosis    GERD (gastroesophageal reflux disease)    H/O hiatal hernia    Headache    "  weekly" (08/23/2017)   History of echocardiogram    Echo 6/16:  Mod LVH, EF 60-65%, no RWMA, Gr 1 DD, trivial MR, normal LA size.   History of radiation therapy    Left breast- 12/26/22-01/23/23-Dr. Retta Caster   Hyperglycemia 11/20/2015   Hyperlipidemia, mixed 09/11/2007   Qualifier: Diagnosis of  By: Tracy Friedlander   Did not tolerate Lipitor, zocor, Lovastatin, Pravastatin, Livalo , Crestor  even low dose    IBS (irritable bowel syndrome)    Maxillary sinusitis    Normal cardiac stress test 11/2011   No evidence of ischemia or infarct.   Calculated ejection fraction 72%.   Obesity     OSA (obstructive sleep apnea) 02/2012   has stopped using  cpap   Osteoarthritis    Osteoporosis    Personal history of radiation therapy    Pneumonia 11/2011   "before 2013 I hadn't had pneumonia since I was a child" (04/13/2012)   Pulmonary nodules    S/P angioplasty with stent 08/23/17 ostial 2nd OM with DES synnergy 08/24/2017   Schatzki's ring     Past Surgical History:  Procedure Laterality Date   ANTERIOR AND POSTERIOR REPAIR N/A 10/18/2022   Procedure: ANTERIOR (CYSTOCELE);  Surgeon: Thurman Flores, MD;  Location: [facility] OR;  Service: Gynecology;  Laterality: N/A;   APPENDECTOMY  1989   BREAST BIOPSY  11/03/2022   MM LT RADIOACTIVE SEED LOC MAMMO GUIDE 11/03/2022 [facility] MAMMOGRAPHY   BREAST LUMPECTOMY WITH RADIOACTIVE SEED LOCALIZATION Left 11/04/2022   Procedure: LEFT BREAST LUMPECTOMY WITH RADIOACTIVE SEED LOCALIZATION;  Surgeon: Caralyn Chandler, MD;  Location: [facility];  Service: General;  Laterality: Left;   CARDIAC CATHETERIZATION N/A 11/25/2014   Procedure: Right/Left Heart Cath and Coronary Angiography;  Surgeon: Arty Binning, MD;  Location: [facility] INVASIVE CV LAB;  Service: Cardiovascular;  Laterality: N/A;   CESAREAN SECTION  1985   COLONOSCOPY  03/2022   CORONARY ANGIOPLASTY WITH STENT PLACEMENT  08/23/2017   CORONARY STENT INTERVENTION N/A 08/23/2017   Procedure: CORONARY STENT INTERVENTION;  Surgeon: Odie Benne, MD;  Location: [facility] INVASIVE CV LAB;  Service: Cardiovascular;  Laterality: N/A;   HERNIA REPAIR  04/13/2012   VHR laparoscopic   LAPAROSCOPIC BILATERAL SALPINGO OOPHERECTOMY Bilateral 10/18/2022   Procedure: LAPAROTOMY WITH BILATERAL SALPINGO OOPHORECTOMY;  Surgeon: Thurman Flores, MD;  Location: [facility] OR;  Service: Gynecology;  Laterality: Bilateral;   LEFT HEART CATH AND CORONARY ANGIOGRAPHY N/A 08/23/2017   Procedure: LEFT HEART CATH AND CORONARY ANGIOGRAPHY;  Surgeon: Odie Benne, MD;  Location: [facility] INVASIVE CV LAB;   Service: Cardiovascular;  Laterality: N/A;   VAGINAL HYSTERECTOMY N/A 10/18/2022   Procedure: HYSTERECTOMY VAGINAL;  Surgeon: Thurman Flores, MD;  Location: [facility] OR;  Service: Gynecology;  Laterality: N/A;   VENTRAL HERNIA REPAIR  04/13/2012   Procedure: LAPAROSCOPIC VENTRAL HERNIA;  Surgeon: Levert Ready, MD;  Location: [facility] OR;  Service: General;  Laterality: N/A;  laparoscopic repair of incarcerated hernia    Family History  Problem Relation Age of Onset   Breast cancer Mother 21       bilateral breast cancer dx. 38, met to liver   Hypertension Mother    Diabetes Mother    Diverticulosis Father    Prostate cancer Father    Breast cancer Sister 61       DCIS at 64, IDC at 15, PALB2+   Cancer Sister        breast cancer, invasive ductal carcinoma in 2022,DCIS at 16 with  4 weeks of radiation, 5 years of Tamoxifen     Pulmonary embolism Brother        recurrent   Heart attack Maternal Grandfather    Ovarian cancer Paternal Grandmother    Cerebral palsy Son    Prostate cancer Paternal Uncle    Breast cancer Niece 48       PALB2+   Osteoporosis Niece    Stroke Neg Hx    Colon cancer Neg Hx    Esophageal cancer Neg Hx    Stomach cancer Neg Hx    Rectal cancer Neg Hx     Social History   Socioeconomic History   Marital status: Married    Spouse name: Not on file   Number of children: 1   Years of education: 14   Highest education level: Associate degree: academic program  Occupational History   Occupation: Disabled   Tobacco Use   Smoking status: Never    Passive exposure: Never   Smokeless tobacco: Never  Vaping Use   Vaping status: Never Used  Substance and Sexual Activity   Alcohol use: Yes    Alcohol/week: 4.0 - 7.0 standard drinks of alcohol    Types: 4 - 7 Standard drinks or equivalent per week   Drug use: No   Sexual activity: Yes    Birth control/protection: Surgical    Comment: gluten free, lives with husband and son with CP quadriplegia  Other Topics  Concern   Not on file  Social History Narrative   Cares for son with cerebral palsy.    Lives at home with her husband and son.   Right-handed.   2 cups caffeine per day.   One story home   Social Drivers of Health   Financial Resource Strain: Low Risk  (01/18/2023)   Overall Financial Resource Strain (CARDIA)    Difficulty of Paying Living Expenses: Not hard at all  Food Insecurity: No Food Insecurity (07/30/2023)   Hunger Vital Sign    Worried About Running Out of Food in the Last Year: Never true    Ran Out of Food in the Last Year: Never true  Transportation Needs: No Transportation Needs (07/30/2023)   PRAPARE - Administrator, Civil Service (Medical): No    Lack of Transportation (Non-Medical): No  Physical Activity: Inactive (02/10/2023)   Exercise Vital Sign    Days of Exercise per Week: 0 days    Minutes of Exercise per Session: 0 min  Stress: Stress Concern Present (01/18/2023)   [employer] of Occupational Health - Occupational Stress Questionnaire    Feeling of Stress : To some extent  Social Connections: Socially Integrated (07/29/2023)   Social Connection and Isolation Panel [NHANES]    Frequency of Communication with Friends and Family: More than three times a week    Frequency of Social Gatherings with Friends and Family: Once a week    Attends Religious Services: More than 4 times per year    Active Member of [employer] or Organizations: Yes    Attends Engineer, structural: More than 4 times per year    Marital Status: Married  Catering manager Violence: Not At Risk (07/30/2023)   Humiliation, Afraid, Rape, and Kick questionnaire    Fear of Current or Ex-Partner: No    Emotionally Abused: No    Physically Abused: No    Sexually Abused: No    Outpatient Medications Prior to Visit  Medication Sig Dispense Refill   acetaminophen  (TYLENOL ) 650 MG CR  tablet Take 650-1,300 mg by mouth every 8 (eight) hours as needed for pain.     albuterol   (VENTOLIN  HFA) 108 (90 Base) MCG/ACT inhaler Inhale 1-2 puffs into the lungs every 6 (six) hours as needed. 8 g 2   apixaban  (ELIQUIS ) 5 MG TABS tablet Take 2 tablets (10 mg total) by mouth 2 (two) times daily for 6 days, THEN 1 tablet (5 mg total) 2 (two) times daily. 60 tablet 0   apixaban  (ELIQUIS ) 5 MG TABS tablet Take 1 tablet (5 mg total) by mouth 2 (two) times daily. 60 tablet 4   ascorbic acid (VITAMIN C) 500 MG tablet Take 500 mg by mouth every evening.     Benralizumab  (FASENRA  PEN) 30 MG/ML SOAJ Inject 1 mL (30 mg total) into the skin every 8 (eight) weeks. 1 mL 2   Calcium -Magnesium -Vitamin D  (CALCIUM  MAGNESIUM  PO) Take 1 tablet by mouth at bedtime.     Cholecalciferol  (VITAMIN D3 ULTRA STRENGTH) 125 MCG (5000 UT) capsule Take 5,000 Units by mouth every evening.     clonazePAM  (KLONOPIN ) 0.5 MG tablet Take 1 tablet (0.5 mg total) by mouth in the morning. 30 tablet 1   denosumab  (PROLIA ) 60 MG/ML SOSY injection Inject 60 mg into the skin every 6 (six) months.     doxepin  (SINEQUAN ) 25 MG capsule Take 1 capsule (25 mg total) by mouth at bedtime. 30 capsule 3   Evolocumab  (REPATHA  SURECLICK) 140 MG/ML SOAJ Inject 140 mg into the skin every 14 (fourteen) days. (Patient taking differently: Inject 140 mg into the skin every 21 ( twenty-one) days.) 2 mL 11   famotidine  (PEPCID ) 20 MG tablet TAKE 1 TABLET BY MOUTH TWICE A DAY (Patient taking differently: Take 20 mg by mouth every evening.) 60 tablet 11   ipratropium-albuterol  (DUONEB) 0.5-2.5 (3) MG/3ML SOLN Take 3 mLs by nebulization every 6 (six) hours as needed. (Patient taking differently: Take 3 mLs by nebulization every 6 (six) hours as needed (for shortness of breazth or wheezing).) 120 mL 5   nitroGLYCERIN  (NITROSTAT ) 0.4 MG SL tablet Place 1 tablet (0.4 mg total) under the tongue every 5 (five) minutes as needed for chest pain. 25 tablet 11   pindolol  (VISKEN ) 5 MG tablet Take 1 tablet (5 mg total) by mouth daily after breakfast. 30 tablet  3   risperiDONE  (RISPERDAL ) 0.5 MG tablet Take 1 tablet (0.5 mg total) by mouth 2 (two) times daily. Will start the 0.5 mg BID dosing in 2 weeks 60 tablet 2   risperiDONE  (RISPERDAL ) 1 MG tablet Take 1 tablet (1 mg total) by mouth at bedtime. 30 tablet 2   Semaglutide ,0.25 or 0.5MG /DOS, 2 MG/1.5ML SOPN Inject 0.25 mg into the skin every Friday.     venlafaxine  XR (EFFEXOR -XR) 75 MG 24 hr capsule TAKE 3 CAPSULES (225 MG TOTAL) BY MOUTH DAILY WITH BREAKFAST. 90 capsule 2   Facility-Administered Medications Prior to Visit  Medication Dose Route Frequency Provider Last Rate Last Admin   [START ON 11/06/2023] denosumab  (PROLIA ) injection 60 mg  60 mg Subcutaneous Once Alessandro Griep A, MD        Allergies  Allergen Reactions   Latuda  [Lurasidone  Hcl] Other (See Comments)    PER THE PT, CAUSED RESTLESSNESS   Beclomethasone Dipropionate Hives and Other (See Comments)    Weight gain, also   Flexeril  [Cyclobenzaprine ] Anxiety   Mometasone  Furo-Formoterol Fum Hives and Other (See Comments)    Weight gain, also   Sulfonamide Derivatives Hives and Rash   Statins  Other (See Comments)    Myalgias, RLS   Fluticasone  Rash and Other (See Comments)    "Weight gain," too    Review of Systems  Constitutional:  Positive for malaise/fatigue. Negative for fever.  HENT:  Negative for congestion.   Eyes:  Negative for blurred vision.  Respiratory:  Negative for hemoptysis and shortness of breath.   Cardiovascular:  Negative for chest pain, palpitations and leg swelling.  Gastrointestinal:  Negative for abdominal pain, blood in stool and nausea.  Genitourinary:  Negative for dysuria and frequency.  Musculoskeletal:  Negative for falls.  Skin:  Negative for rash.  Neurological:  Negative for dizziness, loss of consciousness and headaches.  Endo/Heme/Allergies:  Negative for environmental allergies.  Psychiatric/Behavioral:  Positive for depression. The patient is not nervous/anxious.        Objective:     Physical Exam Constitutional:      General: She is not in acute distress.    Appearance: Normal appearance. She is not ill-appearing or toxic-appearing.  HENT:     Head: Normocephalic and atraumatic.     Right Ear: External ear normal.     Left Ear: External ear normal.     Nose: Nose normal.  Eyes:     General:        Right eye: No discharge.        Left eye: No discharge.  Pulmonary:     Effort: Pulmonary effort is normal.  Skin:    Findings: No rash.  Neurological:     Mental Status: She is alert and oriented to person, place, and time.  Psychiatric:        Behavior: Behavior normal.     There were no vitals taken for this visit. Wt Readings from Last 3 Encounters:  09/11/23 167 lb 3.2 oz (75.8 kg)  08/07/23 168 lb 3.2 oz (76.3 kg)  08/02/23 166 lb 9.6 oz (75.6 kg)       Assessment & Plan:  Anxiety and depression Assessment & Plan: Significant stress with her son in the hospital   Moderate persistent asthma with acute exacerbation Assessment & Plan: Working with pulmonary, stable on Fasenra  and current meds   Hyperglycemia Assessment & Plan: hgba1c acceptable, minimize simple carbs. Increase exercise as tolerated. Consider Prevnar 20, Arexvy and covid and flu boosters annually  Orders: -     Hemoglobin A1c; Future -     TSH; Future  Hyperlipidemia, mixed Assessment & Plan: Encourage heart healthy diet such as MIND or DASH diet, increase exercise, avoid trans fats, simple carbohydrates and processed foods, consider a krill or fish or flaxseed oil cap daily.   Orders: -     Lipid panel; Future  Statin myopathy Assessment & Plan: Tolerating Repatha .   Age-related osteoporosis without current pathological fracture -     VITAMIN D  25 Hydroxy (Vit-D Deficiency, Fractures); Future  Chronic diastolic CHF (congestive heart failure), NYHA class 2 (HCC) -     Comprehensive metabolic panel with GFR; Future -     CBC with Differential/Platelet;  Future -     TSH; Future     Assessment and Plan Assessment & Plan Wellness Visit Routine wellness visit with no new issues. - Continue current medications and management. - Schedule blood work for late June to assess vitamin D  and protein levels. - Plan follow-up appointment in July for physical examination and review of blood work results.  Anxiety and depression Current medications well-managed. Plan to reduce Risperidone  evening dose from 1 mg to  0.5 mg delayed until post-trip for stability. - Maintain current Risperidone  dosage until after the trip. - Reassess Risperidone  dosage post-trip and consider reducing evening dose to 0.5 mg.  Weight gain due to Risperdal  No weight loss despite semaglutide  use. Risperidone  may counteract weight loss efforts. Semaglutide  aids portion control and satiety. - Continue semaglutide  and monitor weight and side effects. - Consider reducing Risperidone  dosage post-trip to aid weight management.  Vitamin D  level management Vitamin D  level at 95, high by endocrinologist but acceptable by orthopedist. Lack of consensus on optimal levels. - Hold vitamin D  supplementation until blood work in late June. - Order blood work for late June to reassess vitamin D  levels. - Reevaluate vitamin D  supplementation based on blood work results.  Varicose veins Persistent purple band above right ankle, possibly varicose veins or circulation issue. No pain, redness, or heat. - Evaluate the area during the July appointment. - Consider referral to vascular specialist if necessary after evaluation.     I discussed the assessment and treatment plan with the patient. The patient was provided an opportunity to ask questions and all were answered. The patient agreed with the plan and demonstrated an understanding of the instructions.   The patient was advised to call back or seek an in-person evaluation if the symptoms worsen or if the condition fails to improve as  anticipated.  Randie Bustle, MD [facility] Primary Care at [facility] [phone] (phone) [phone] (fax)  [facility]

## 2023-10-30 ENCOUNTER — Encounter: Payer: Self-pay | Admitting: Internal Medicine

## 2023-10-30 ENCOUNTER — Ambulatory Visit: Admitting: Internal Medicine

## 2023-10-30 ENCOUNTER — Inpatient Hospital Stay: Attending: Hematology

## 2023-10-30 VITALS — BP 122/70 | HR 94 | Ht 62.0 in | Wt 166.0 lb

## 2023-10-30 DIAGNOSIS — Z1732 Human epidermal growth factor receptor 2 negative status: Secondary | ICD-10-CM | POA: Insufficient documentation

## 2023-10-30 DIAGNOSIS — Z1721 Progesterone receptor positive status: Secondary | ICD-10-CM | POA: Diagnosis not present

## 2023-10-30 DIAGNOSIS — B4481 Allergic bronchopulmonary aspergillosis: Secondary | ICD-10-CM | POA: Diagnosis not present

## 2023-10-30 DIAGNOSIS — D500 Iron deficiency anemia secondary to blood loss (chronic): Secondary | ICD-10-CM | POA: Insufficient documentation

## 2023-10-30 DIAGNOSIS — Z923 Personal history of irradiation: Secondary | ICD-10-CM | POA: Insufficient documentation

## 2023-10-30 DIAGNOSIS — C50512 Malignant neoplasm of lower-outer quadrant of left female breast: Secondary | ICD-10-CM | POA: Insufficient documentation

## 2023-10-30 DIAGNOSIS — Z79810 Long term (current) use of selective estrogen receptor modulators (SERMs): Secondary | ICD-10-CM | POA: Diagnosis not present

## 2023-10-30 DIAGNOSIS — J4550 Severe persistent asthma, uncomplicated: Secondary | ICD-10-CM | POA: Diagnosis not present

## 2023-10-30 DIAGNOSIS — D649 Anemia, unspecified: Secondary | ICD-10-CM

## 2023-10-30 DIAGNOSIS — Z170 Estrogen receptor positive status [ER+]: Secondary | ICD-10-CM | POA: Diagnosis not present

## 2023-10-30 DIAGNOSIS — I2699 Other pulmonary embolism without acute cor pulmonale: Secondary | ICD-10-CM | POA: Diagnosis not present

## 2023-10-30 LAB — CMP (CANCER CENTER ONLY)
ALT: 12 U/L (ref 0–44)
AST: 13 U/L — ABNORMAL LOW (ref 15–41)
Albumin: 3.9 g/dL (ref 3.5–5.0)
Alkaline Phosphatase: 74 U/L (ref 38–126)
Anion gap: 2 — ABNORMAL LOW (ref 5–15)
BUN: 13 mg/dL (ref 8–23)
CO2: 30 mmol/L (ref 22–32)
Calcium: 9.1 mg/dL (ref 8.9–10.3)
Chloride: 103 mmol/L (ref 98–111)
Creatinine: 0.63 mg/dL (ref 0.44–1.00)
GFR, Estimated: >60 mL/min (ref >60)
Glucose, Bld: 85 mg/dL (ref 70–99)
Potassium: 4.3 mmol/L (ref 3.5–5.1)
Sodium: 135 mmol/L (ref 135–145)
Total Bilirubin: 0.2 mg/dL (ref 0.0–1.2)
Total Protein: 6.3 g/dL — ABNORMAL LOW (ref 6.5–8.1)

## 2023-10-30 LAB — CBC WITH DIFFERENTIAL (CANCER CENTER ONLY)
Abs Immature Granulocytes: 0.02 10*3/uL (ref 0.00–0.07)
Basophils Absolute: 0 10*3/uL (ref 0.0–0.1)
Basophils Relative: 0 %
Eosinophils Absolute: 0 10*3/uL (ref 0.0–0.5)
Eosinophils Relative: 0 %
HCT: 32.9 % — ABNORMAL LOW (ref 36.0–46.0)
Hemoglobin: 10.7 g/dL — ABNORMAL LOW (ref 12.0–15.0)
Immature Granulocytes: 0 %
Lymphocytes Relative: 18 %
Lymphs Abs: 1 10*3/uL (ref 0.7–4.0)
MCH: 27.9 pg (ref 26.0–34.0)
MCHC: 32.5 g/dL (ref 30.0–36.0)
MCV: 85.9 fL (ref 80.0–100.0)
Monocytes Absolute: 0.6 10*3/uL (ref 0.1–1.0)
Monocytes Relative: 10 %
Neutro Abs: 4.1 10*3/uL (ref 1.7–7.7)
Neutrophils Relative %: 72 %
Platelet Count: 262 10*3/uL (ref 150–400)
RBC: 3.83 MIL/uL — ABNORMAL LOW (ref 3.87–5.11)
RDW: 14.5 % (ref 11.5–15.5)
WBC Count: 5.7 10*3/uL (ref 4.0–10.5)
nRBC: 0 % (ref 0.0–0.2)

## 2023-10-30 NOTE — Patient Instructions (Addendum)
 Other acute pulmonary embolism without acute cor pulmonale (HCC)  -Diagnosed 07/28/2023.  Small clot burden probably related to tamoxifen   Plan - Continue Eliquis  at full dose for at least 12 months and then reassess with a D-dimer and decide low-dose protocol for another 6-12 months  -MONITOR  for bleeding - Do not do tamoxifen  -recheck d-dimer at followup  Severe persistent asthma with intensive monitoring  -Current stable  Plan - Continue current treatment as before (Fasentra, etc)  Follow-up - 6 months with nurse practitioner or sooner if needed; okay to see [facility]

## 2023-10-30 NOTE — Progress Notes (Signed)
 OV 10/06/2016  Chief Complaint  Patient presents with   Follow-up    Pt states her asthma is good; had one flare up since last visit. SOB with exertion, cough with yellow to brown mucus prodcution, with frequent wheezing. Notice improvement with being on [employer]      68 year old female with severe asthma with ABPA and elevated IgE and eosinophilia. She is on IL5RAb antibody treatment now for the last few months. She feels this is helping her significantly. Her symptoms course of better. She is on Asmanex , Spiriva , Singulair  as well. She is not on daily prednisone  anymore. In December 2017 should she did grow MRSA from sputum culture. Currently a few days ago she developed sore throat and having some sputum production. She is worried this might be MRSA. Also asthma itself is stable and well controlled according to history. She is wondering about getting a sputum culture and really wishes for it    OV 01/11/2017  Chief Complaint  Patient presents with   Follow-up    Pt states her breathing is doing well. Pt states the last couple days she has had more wheezing. Pt states she has noticed an improvement being on Cinqair .    68 year old female with severe asthma with ABPA and elevated IgE and eosinophilia. She is on IL5RAb antibody treatment.  She is on Asmanex , Spiriva , Singulair  as well. She is not on daily prednisone  anymore   01/11/2017 - Here for routine follow-up. She is on interleukin-5 receptor antibody IV infusion therapy. This was held a few weeks ago because of asthma exacerbation. She says approximately on Father's Day 2018 he started feeling unwell and then ended up with an exacerbation for which she needed antibiotics and prednisone . She is surprised by this decompensation despite being on this biologic immunotherapy for asthma. She is compliant with her Asmanex , Spiriva  and Singulair . She is not on prednisone . Currently she feels baseline. Patient infusion #6 or 7 is due for  tomorrow [interleukin-5 receptor antibody). Overall she feels improved quality of life. Despite all this exhaled nitric oxide  continues to be HIGH; unclear why   OV 04/21/2017  Chief Complaint  Patient presents with   Follow-up    Pt states that she has been doing good. Has had no flare-ups with her asthma. Mild coughing at night. Denies any SOB or CP.  severe asthma with ABPA and elevated IgE and eosinophilia. She is on IL5RAb antibody treatment.  She is on Asmanex , Spiriva , Singulair  as well. She is not on daily prednisone  anymore  Continues cinqair  spiriva , singulair , and asmanex . At this point in time the interleukin-5 receptor antibody is working really well for her. For nitric oxide  today is lowest ever.. In terms of his symptoms she hardly ever gets up at night. When she wakes up she does not have any symptoms. She only has slight limitation with physical activity. She's not shortness of breath because of asthma not wheezing and not using albuterol  for rescue use. The asthma control score is 0.8. However she is extremely frustrated by the [facility] gravida cycle management of her interleukin-5 receptor antibody infusions. She is getting different bills wrong bills missed bills. Therefore she wants to switch to subcutaneous injectable therapy. She is very upset about the quality of care that she has received at the infusion center because of billing practices.  OV 10/19/2017  Chief Complaint  Patient presents with   Follow-up    PFT done today. Pt states she has  been doing well. Has had a little more coughing and wheezing recently and thinks it could be due to the weather. Pt just got started back on Fasenra  injections.   Severe persistent asthma with ABPA elevated IgE and eosinophilia. S/P cinqair  interleukin-5 receptor antibody treatment ending" 2018 with good response but stopped secondary to billing errors at [place] infusion center. Based on medical need then switched  to anti-eosinophil interleukin-5 receptor antibody subcutaneous injection treatment Fasenra  early 2019. She is also on baseline Asmanex , Spiriva , Singulair  and antihistamine 8. She is not on daily prednisone  anymore.  Last seen November 2018. Overall she was doing well with good effort tolerance but in March 2019 had some atypical chest painand ended up with a coronary artery stent in the abuse marginal. She is improved from that. She is wondering about taking a beta [name] safely. She does not feel that his anemia because of blood pressure is low but apparently cardiology is asked her to clear this with me. In the interim her IV injection interleukin-5 receptor antibody has been changed to subcutaneous injection FASENRA  . Because of her cardiac issues she's only taken 3 doses since January 2019. She hopes to go back on schedule with this. She had billing issues at the [facility] infusion center with the IV infusion and that is why we switched to the subcutaneous injection. However, it looks like the workflow authorization for this was not done correctly and she isn't of the large co-pay. She is unhappy about this and routinely discussed this with the patient. In terms of asthma she feels her cough is decompensated in the last few weeks and this correlates with her not taking her Asmanex . Her lung function test is near normal today. Her exhaled nitric oxide  is slightly elevated than the lowest that she's had but still better than her baseline average. Currently she is waking up at nightcoughing because of the spring pollen but only hardly. Very mild symptoms when she wakes up she is slightly limited in the daytime and she experiences shortness of breath little of the time not using albuterol  for rescue.    Results for ERYANNA, BORSCH (MRN [id number]) as of 10/19/2017 11:51  Ref. Range 10/19/2017 10:47  FEV1-Post Latest Units: L 1.80  FEV1-%Pred-Post Latest Units: % 76  FEV1-%Change-Post Latest Units: % 0   Results for MARISELDA, CRYSLER (MRN [id number]) as of 10/19/2017 11:51  Ref. Range 10/19/2017 10:47  TLC Latest Units: L 4.90  TLC % pred Latest Units: % 103  Results for CLARITZA, SCHNARS (MRN [id number]) as of 10/19/2017 11:51  Ref. Range 10/19/2017 10:47  DLCO unc Latest Units: ml/min/mmHg 19.51  DLCO unc % pred Latest Units: % 90    OV 05/14/2018  Subjective:  Patient ID: Victoria [name], female , DOB: [birth year] , age 49 y.o. , MRN: [id number] , ADDRESS: [address] [place] [address]   05/14/2018 -   Chief Complaint  Patient presents with   Follow-up    breathing good, SOB on exertion   Severe persistent asthma with ABPA elevated IgE and eosinophilia. S/P cinqair  interleukin-5 receptor antibody treatment ending" 2018 with good response but stopped secondary to billing errors at  infusion center. Based on medical need then switched to anti-eosinophil interleukin-5 receptor antibody subcutaneous injection treatment Fasenra  early 2019. She is also on baseline Asmanex , Spiriva , Singulair  and antihistamine 8. She is not on daily prednisone  anymore.  Known hiatal hernia  HPI QUINA DALTON 68 y.o. -returns for follow-up  of severe persistent asthma.  Earlier this year we switched her to interleukin-5 receptor antibody subcutaneous injection may be [employer] called Fasenra .  With this she feels her asthma symptoms have improved dramatically.  She feels it is better than seeing care.  She still thankful to the scientist of developed this biologic.  Her exam nitric oxide  is the lowest ever at 27.  She is not having any nighttime symptoms or any wheezing.  In fact she stopped her inhaled steroid partly because she was feeling well but partly also because she is in the donut hole.  However she is continuing her Spiriva  and Singulair .  She is up-to-date with her flu shot.    She is dealing with skin infections.  Primary care physician notes reviewed.  There is question whether this is  being precipitated by the fact she is on to Biologics.  She thinks she had MRSA but when I reviewed the chart on May 08, 2018 shows MSSA.  Therefore she is been started on Augmentin  today which she is yet to start.  She also has hiatal hernia.  She has not seen GI in a few years.  It is fairly well-controlled and she plans to see GI soon.  OV 02/14/2019  Subjective:  Patient ID: Victoria [name], female , DOB: [birth year] , age 39 y.o. , MRN: [id number] , ADDRESS: [address] [place] [address]   02/14/2019 -   Chief Complaint  Patient presents with   Severe persistent asthma with intensive monitoring   Severe persistent asthma with ABPA elevated IgE and eosinophilia. S/P cinqair  interleukin-5 receptor antibody treatment ending" 2018 with good response but stopped secondary to billing errors at [place] infusion center. Based on medical need then switched to anti-eosinophil interleukin-5 receptor antibody subcutaneous injection treatment Fasenra  early 2019. She is  NOT on baseline Asmanex , Spiriva , Singulair  and antihistamine 8. She is not on daily prednisone  anymore.  Known hiatal hernia  HPI DERRIKA KOSHY 68 y.o. -is here for follow-up of asthma with ABPA.  Since her last visit she is only on biologic Fasenra .  She has stopped Singulair , Spiriva , Asmanex .  She is not taking daily prednisone .  Asthma is well controlled.  A few weeks ago she did have a itchy feeling intake epinephrine .  She also had wheeze at this time.  This helped control the situation.  Other than that she is doing well asthma is extremely well controlled.  No nocturnal awakenings no albuterol  rescue use.  We are unable to do exhaled nitric oxide  testing today because of the pandemic.  She told me that she wants to remove budesonide  out of her allergy  list because she is only allergic to [facility] and not the nebulizer.  She is doing social distancing.  She had an appointment earlier in the year with GI because of her  hiatal hernia but because of the pandemic she did not follow through.  She is open to having this appointment and referral made again.    OV 05/04/2020  Subjective:  Patient ID: Victoria [name], female , DOB: 1955/08/11 , age 69 y.o. , MRN: [id number] , ADDRESS: [address] [place] [address] PCP Neda Balk, MD Patient Care Team: Neda Balk, MD as PCP - General (Family Medicine) Liza Riggers, MD as PCP - Cardiology (Cardiology) Haverstock, Thornell Flirt, MD as Referring Physician (Dermatology) Maire Scot, MD as Consulting Physician (Pulmonary Disease) Liza Riggers, MD as Consulting Physician (Cardiology) Thurman Flores, MD as Consulting  Physician (Obstetrics and Gynecology) Vernadine Golas, MD as Consulting Physician (Otolaryngology) Pyrtle, Amber Bail, MD as Consulting Physician (Gastroenterology) Merriam Abbey, DO as Consulting Physician (Neurology)  This Provider for this visit: Treatment Team:  Attending Provider: Maire Scot, MD    05/04/2020 -   Chief Complaint  Patient presents with   Follow-up    Pt c/o cough since 1 day ago- prod with yellow sputum. She has also been wheezing some. She uses her duoneb 4-6 x per wk.      HPI YURIKO ORZECH 68 y.o. -presents for follow-up of a complicated asthma with ABPA.  She has been overall doing well with Fasenra .  However in September 2021 after being exposed to someone with Covid she had respiratory exacerbation but she was ruled out for Covid.  She took 5 days of prednisone  and did well but she had a cough that lingered through October 2021.  Then she developed chest pains.  We did a CT angiogram that ruled out PE.  I was involved in the decision making.  It only showed hiatal hernia.  I personally visualized that CT again.  She can got worried that the Fasenra  is not working because of that one respiratory exacerbation and the lingering cough.  In addition she tells me that yesterday while eating  some crackers she choked and today she is wheezing.  She is noted to be on alendronate  for the last 2 years.  This is by primary care physician.  In the spring 2020 when she did see GI Dr. Bridgett Camps and interventional endoscopy has been recommended but she is so far holding off on that.  Her acid reflux regimen has been escalated.  She is up-to-date with her vaccine but is inquiring about Covid booster timing.   Asthma Control Test ACT Total Score  05/04/2020 19     Lab Results  Component Value Date   NITRICOXIDE 42 04/21/2017     OV 06/04/2021  Subjective:  Patient ID: Victoria [name], female , DOB: [birth year] , age 11 y.o. , MRN: [id number] , ADDRESS: [address] [place] [address] PCP Neda Balk, MD Patient Care Team: Neda Balk, MD as PCP - General (Family Medicine) Liza Riggers, MD (Inactive) as PCP - Cardiology (Cardiology) Haverstock, Thornell Flirt, MD as Referring Physician (Dermatology) Maire Scot, MD as Consulting Physician (Pulmonary Disease) Liza Riggers, MD (Inactive) as Consulting Physician (Cardiology) Thurman Flores, MD as Consulting Physician (Obstetrics and Gynecology) Vernadine Golas, MD as Consulting Physician (Otolaryngology) Pyrtle, Amber Bail, MD as Consulting Physician (Gastroenterology) Merriam Abbey, DO as Consulting Physician (Neurology)  This Provider for this visit: Treatment Team:  Attending Provider: Maire Scot, MD    06/04/2021 -   Chief Complaint  Patient presents with   Follow-up    Pt states she has been doing okay since last visit. States she has had sinus pressure and also some pain in sinuses.    Severe persistent asthma with ABPA elevated IgE and eosinophilia. S/P cinqair  interleukin-5 receptor antibody treatment ending" 2018 with good response but stopped secondary to billing errors at [place] [facility] center. Based on medical need then switched to anti-eosinophil interleukin-5 receptor antibody  subcutaneous injection treatment Fasenra  early 2019. She is  NOT on baseline Asmanex , Spiriva , Singulair  and antihistamine 8. She is not on daily prednisone  anymore.  Known hiatal hernia  HPI AUBREEANNA FUNAI 68 y.o. -returns for follow-up.  Is a routine follow-up.  At this point in time she  is on biologic Fasenra .  She is not taking any of her inhalers or Singulair .  She did see Tammy.  In the summer 2022 had an exacerbation had to go on prednisone .  Personally saw her a year ago.  Currently asthma is well controlled.  She says she has intermittent wheezing and is wondering if she should be on a maintenance inhaler.  She asked me about evidence in literature about monotherapy with Fasenra .  Explained to her that Biologics was studied as add-on therapy and she needs to be on a maintenance therapy.  She is agreeable to go back on her Asmanex .  This because she does have some occasional wheezing. ACT score shows good control  Of note she is having chronic headaches.  She has been on Fasenra  since 2019.  Prior to that she was started on Repatha .  She is also on Prolia  These can cause headaches.    She had CT angio which shows stable hiatale hernia.   Asthma Control Test ACT Total Score  06/04/2021 23  03/17/2021 16  05/04/2020 19    Lab Results  Component Value Date   NITRICOXIDE 42 04/21/2017     CT Chest data  No results found.      OV 09/06/2021  Subjective:  Patient ID: Victoria [name], female , DOB: 07-Jul-1955 , age 55 y.o. , MRN: [id number] , ADDRESS: [address] [place] [address] PCP Neda Balk, MD Patient Care Team: Neda Balk, MD as PCP - General (Family Medicine) Liza Riggers, MD as PCP - Cardiology (Cardiology) Haverstock, Thornell Flirt, MD as Referring Physician (Dermatology) Maire Scot, MD as Consulting Physician (Pulmonary Disease) Liza Riggers, MD as Consulting Physician (Cardiology) Thurman Flores, MD as Consulting  Physician (Obstetrics and Gynecology) Vernadine Golas, MD as Consulting Physician (Otolaryngology) Pyrtle, Amber Bail, MD as Consulting Physician (Gastroenterology) Merriam Abbey, DO as Consulting Physician (Neurology)  This Provider for this visit: Treatment Team:  Attending Provider: Maire Scot, MD    09/06/2021 -   Chief Complaint  Patient presents with   Follow-up    Pt denies any flare ups with her asthma since last visit. States that she does use her rescue inhaler when she needs it. States that she is still having pain in her sternum and ribs and in back which she has had since last visit.     Severe persistent asthma with ABPA elevated IgE and eosinophilia. S/P cinqair  interleukin-5 receptor antibody treatment ending" 2018 with good response but stopped secondary to billing errors at [place] infusion center. Based on medical need then switched to anti-eosinophil interleukin-5 receptor antibody subcutaneous injection treatment Fasenra  early 2019. She is  NOT on baseline Asmanex , Spiriva , Singulair  and antihistamine 8. She is not on daily prednisone  anymore.  Known hiatal hernia   HPI ARIDAY WEICHMAN 68 y.o. -returns for her 75-month follow-up.  Asthma itself is well controlled.  However she is dealing with significant spinal issues.  She follows with Dr. Naaman Au at a clinic in [place].  She tells me that her scoliosis is worse and the kyphosis is worse.  She is lost 1.25 inches in height and the kyphosis is worse by 13 degrees.  Associated with that she is having persistent left rib cage pain and also sternal pain.  She states at 1 point on 08/15/2021 the pain was so bad she went to the ER and called EMS.  She has been treated with meloxicam .  She also saw Dr. Tama Fails and's  sports medicine is being given left hip injection.  She is attended massage therapy on 08/27/2021 and periodically is used walker.  Currently somewhat better.  She has had CT scans.  She also had ANA that was positive on  09/03/2021.  The results available between 1: 80 and 1: 320.  Apparently rheumatology referral is in place.  Asthma and hiatal hernia itself under control.  She is quite bothered by the pain.  This is despite physical therapy.  She is frustrated by this.    (H): Data is abnormally highh  OV 03/07/2022  Subjective:  Patient ID: Victoria [name], female , DOB: [birth year] , age 23 y.o. , MRN: [id number] , ADDRESS: [address] [place] [address] PCP Neda Balk, MD Patient Care Team: Neda Balk, MD as PCP - General (Family Medicine) Liza Riggers, MD as PCP - Cardiology (Cardiology) Haverstock, Thornell Flirt, MD as Referring Physician (Dermatology) Maire Scot, MD as Consulting Physician (Pulmonary Disease) Liza Riggers, MD as Consulting Physician (Cardiology) Thurman Flores, MD as Consulting Physician (Obstetrics and Gynecology) Vernadine Golas, MD as Consulting Physician (Otolaryngology) Pyrtle, Amber Bail, MD as Consulting Physician (Gastroenterology) Merriam Abbey, DO as Consulting Physician (Neurology)  This Provider for this visit: Treatment Team:  Attending Provider: Maire Scot, MD    03/07/2022 -   Chief Complaint  Patient presents with   Follow-up    Pt states she had covid about 3 weeks ago and states she still has a cough that she cannot get rid of.      HPI DELPHA GIBNEY 68 y.o. -returns for follow-up.  She continues on Fasenra .  Last visit I reintroduced inhaled corticosteroid Asmanex  but for some reason she is not on it.  We went over the rationale for Biologics which is failed inhaler therapy.  Did express concern that if she is not on inhaler therapy that her Fasenra  could be denied in the future.  She is going to restart Asmanex .  Currently she does not have any acid reflux symptoms.  Her back pain is better after she got new orthotics that increases her shoe height.  The only issue is that 3 weeks ago she had COVID-19.  She did take  antiviral and then she got better.  She did have some residual cough but primary care physician has given her some Hydromet.  She says the cough is working its way out.  There are no other new issues.  She will have a flu shot today.   OV 09/15/2022  Subjective:  Patient ID: Victoria [name], female , DOB: 05-11-1956 , age 60 y.o. , MRN: [id number] , ADDRESS: [address] [place] [address] PCP Neda Balk, MD Patient Care Team: Neda Balk, MD as PCP - General (Family Medicine) Liza Riggers, MD as PCP - Cardiology (Cardiology) Haverstock, Thornell Flirt, MD as Referring Physician (Dermatology) Maire Scot, MD as Consulting Physician (Pulmonary Disease) Liza Riggers, MD as Consulting Physician (Cardiology) Thurman Flores, MD as Consulting Physician (Obstetrics and Gynecology) Vernadine Golas, MD as Consulting Physician (Otolaryngology) Pyrtle, Amber Bail, MD as Consulting Physician (Gastroenterology) Merriam Abbey, DO as Consulting Physician (Neurology)  This Provider for this visit: Treatment Team:  Attending Provider: Maire Scot, MD   Severe persistent asthma with ABPA elevated IgE and eosinophilia. S/P cinqair  interleukin-5 receptor antibody treatment ending" 2018 with good response but stopped secondary to billing errors at [place] infusion center. Based on medical need then switched to anti-eosinophil interleukin-5  receptor antibody subcutaneous injection treatment Fasenra  early 2019. She is  NOT on baseline Asmanex , Spiriva , Singulair  and antihistamine 8. She is not on daily prednisone  anymore.  -March 2024 medications include Fasenra , Allegra, Flonase , DuoNeb  Known hiatal hernia  ANA positive - saw Dr Alvira Josephs Sept 2023 and reassured  09/15/2022 -   Chief Complaint  Patient presents with   Follow-up    Follow up. "A lot going on"      HPI AFRICA TALK 68 y.o. -returns for follow-up.  Last seen in September 2023.  Is a routine asthma  follow-up.  From an asthma perspective she is doing well.  She continues to Fasenra .  She is yet to restart her inhaled steroid Asmanex  and she wants a refill for that.  The new issues that she is having surgery for hysterectomy and bilateral salpingo-oophorectomy because of bladder prolapse.  We did a evaluation and I deem her to be low moderate risk for pulmonary complications as long as there is no asthma flareup.  The other interim issues that she had major depression and anxiety and was hospitalized and [facility] for 3 days towards the end of February 2024.  She is also undergoing breast biopsy next week.  All this is naturally upsetting for her but she did indicate that asthma itself is stable.     OV 12/23/2022  Subjective:  Patient ID: Victoria [name], female , DOB: 18-Oct-1955 , age 36 y.o. , MRN: [id number] , ADDRESS: [address] [place] [address] PCP Neda Balk, MD Patient Care Team: Neda Balk, MD as PCP - General (Family Medicine) Sonny Dust, MD as PCP - Cardiology (Cardiology) Haverstock, Thornell Flirt, MD as Referring Physician (Dermatology) Maire Scot, MD as Consulting Physician (Pulmonary Disease) Liza Riggers, MD as Consulting Physician (Cardiology) Thurman Flores, MD as Consulting Physician (Obstetrics and Gynecology) Vernadine Golas, MD as Consulting Physician (Otolaryngology) Pyrtle, Amber Bail, MD as Consulting Physician (Gastroenterology) Alane Hsu, RN as Oncology Nurse Navigator Auther Bo, RN as Oncology Nurse Navigator Caralyn Chandler, MD as Consulting Physician (General Surgery) Sonja Trafford, MD as Consulting Physician (Hematology) Retta Caster, MD as Consulting Physician (Radiation Oncology)  This Provider for this visit: Treatment Team:  Attending Provider: Maire Scot, MD   12/23/2022 -   Chief Complaint  Patient presents with   Follow-up    Cough x 3 days.  Sx worse at bedtime.     HPI DEIONA HYMON 68 y.o. -returns for follow-up but she is actually acutely having symptoms.  She says last week her son was ill with bronchitis but is being managed conservatively and expectantly.  This because his respiratory exam was normal.  However she tells me on 12/20/2022 she has picked up similar illness with a sore throat subsequently a cough.  She has not been able to sleep in her bed and is sleeping in a recliner because of the cough.  Today the sputum turned green.  There is no active wheezing no fever no nausea no no vomiting no diarrhea no urgent care or emergency room visits.  Of note last visit we added Asmanex  to her asthma control because she is only on Fasenra  right now but she says our office did not send the Asmanex .  Will try to reinitiate this prescription.  Past medical - She had a hysterectomy and it went well.  Then got diagnosed with breast cancer on the left side on 11/04/2022 with lumpectomy.  She is  scheduled for radiation for 4 weeks.  Also confirmed on external record review.    OV 04/28/2023  Subjective:  Patient ID: Victoria [name], female , DOB: [birth year] , age 27 y.o. , MRN: [id number] , ADDRESS: [address] [place] [address] PCP Neda Balk, MD Patient Care Team: Neda Balk, MD as PCP - General (Family Medicine) Eilleen Grates, MD as PCP - Cardiology (Cardiology) Haverstock, Thornell Flirt, MD as Referring Physician (Dermatology) Maire Scot, MD as Consulting Physician (Pulmonary Disease) Liza Riggers, MD as Consulting Physician (Cardiology) Thurman Flores, MD as Consulting Physician (Obstetrics and Gynecology) Vernadine Golas, MD as Consulting Physician (Otolaryngology) Pyrtle, Amber Bail, MD as Consulting Physician (Gastroenterology) Alane Hsu, RN as Oncology Nurse Navigator Auther Bo, RN as Oncology Nurse Navigator Caralyn Chandler, MD as Consulting Physician (General Surgery) Sonja Kidder, MD as Consulting Physician  (Hematology) Retta Caster, MD as Consulting Physician (Radiation Oncology)  This Provider for this visit: Treatment Team:  Attending Provider: Maire Scot, MD    04/28/2023 -   Chief Complaint  Patient presents with   Follow-up    Pt denies any concerns      HPI CAMILIA BROUSSARD 68 y.o. -returns for follow-up.  She tells me her asthma is under control.  She is doing well with biologic Fasenra .  She forgets to take her inhaled steroid.  I did remind her of the necessity to do that.  Her new issues shortness of breath on exertion relieved by rest which is worse than baseline.'s been going on for several months.  She has gained 15 pounds or so in the last year.  She is also stopped exercising because of her back pain in the last 18 months.  It is present with exertion relieved by rest. Medical record review indicates Dr. Samella Crete the primary care physician did a cardiac stress test was normal.  Did echocardiogram has grade 1 diastolic dysfunction.  Review of the medications indicates she is on beta-[name] pindolol .  She tells me she has been on this since February 2024 for anxiety.  Did indicate to her that this can make asthma worse.  Exercise hypoxemia test here just walking around our clinic she did not desaturate.  Infection become tachycardic.  In terms of her social history: She has a only son who is disabled.  The she is planning to graduate him into a nursing home she is finding this emotionally tough.     OV 08/02/2023  Subjective:  Patient ID: Victoria [name], female , DOB: 1956-06-08 , age 55 y.o. , MRN: [id number] , ADDRESS: [address] [place] [id number] PCP Phyllis Breeze, MD Patient Care Team: Mannam, Praveen, MD as PCP - General (Pulmonary Disease) Eilleen Grates, MD as PCP - Cardiology (Cardiology) Haverstock, Thornell Flirt, MD as Referring Physician (Dermatology) Maire Scot, MD as Consulting Physician (Pulmonary Disease) Liza Riggers,  MD as Consulting Physician (Cardiology) Thurman Flores, MD as Consulting Physician (Obstetrics and Gynecology) Vernadine Golas, MD as Consulting Physician (Otolaryngology) Pyrtle, Amber Bail, MD as Consulting Physician (Gastroenterology) Alane Hsu, RN as Oncology Nurse Navigator Auther Bo, RN as Oncology Nurse Navigator Caralyn Chandler, MD as Consulting Physician (General Surgery) Sonja [place], MD as Consulting Physician (Hematology) Retta Caster, MD as Consulting Physician (Radiation Oncology)  This Provider for this visit: Treatment Team:  Attending Provider: Maire Scot, MD     08/02/2023 -   Chief Complaint  Patient presents with   Follow-up  Went to ED 07/28/23 with elevated D Dimer and SOB- dx with PE and started on Eliquis . She has DOE with walking short distances. She has occ dry cough.      HPI RAMSHA COLLER 68 y.o. -returns for acute follow-up posthospital.  On 07/28/2023 she had a high D-dimer and I called her.  She ended up in the ER stayed in the hospital for 48 hours diagnosed with pulmonary embolism.  BNP was normal.  She was not on oxygen .  Right ventricular function on echo was normal.  She is now on Eliquis  tolerating it well no side effects.  No bleeding episodes.  But she says she still has shortness of breath with exertion relieved by rest.  Asthma itself is stable  She wants a letter to get out of jury duty.   OV 10/30/2023  Subjective:  Patient ID: Victoria [name], female , DOB: 1955/11/19 , age 27 y.o. , MRN: [id number] , ADDRESS: [address] [place] [id number] PCP Neda Balk, MD Patient Care Team: Neda Balk, MD as PCP - General (Family Medicine) Eilleen Grates, MD as PCP - Cardiology (Cardiology) Haverstock, Thornell Flirt, MD as Referring Physician (Dermatology) Maire Scot, MD as Consulting Physician (Pulmonary Disease) Liza Riggers, MD as Consulting Physician (Cardiology) Thurman Flores, MD as Consulting  Physician (Obstetrics and Gynecology) Vernadine Golas, MD as Consulting Physician (Otolaryngology) Pyrtle, Amber Bail, MD as Consulting Physician (Gastroenterology) Alane Hsu, RN as Oncology Nurse Navigator Auther Bo, RN as Oncology Nurse Navigator Caralyn Chandler, MD as Consulting Physician (General Surgery) Sonja Dixon, MD as Consulting Physician (Hematology) Retta Caster, MD as Consulting Physician (Radiation Oncology)  This Provider for this visit: Treatment Team:  Attending Provider: Maire Scot, MD    Severe persistent asthma with ABPA elevated IgE and eosinophilia. S/P cinqair  interleukin-5 receptor antibody treatment ending" 2018 with good response but stopped secondary to billing errors at [place] [facility]. Based on medical need then switched to anti-eosinophil interleukin-5 receptor antibody subcutaneous injection treatment Fasenra  early 2019. She is  NOT on baseline Asmanex , Spiriva , Singulair  and antihistamine 8. She is not on daily prednisone  anymore.  -March 2024 medications include Fasenra , Allegra, Flonase , DuoNeb  Known hiatal hernia  - sees DR Pyrtle  ANA positive - saw Dr Alvira Josephs Sept 2023 and reassured   Pulmonary EMbolism:   On 07/28/2023 she had a high D-dimer and I called her.  She ended up in the ER stayed in the hospital for 48 hours diagnosed with pulmonary embolism.  BNP was normal.  She was not on oxygen .  Right ventricular function on echo was normal.  She is now on Eliquis  tolerating it well no side effec DUE TO TAMOXIFENT  10/30/2023 -   Chief Complaint  Patient presents with   Follow-up    Breathing is overall doing well.       HPI CLEMENTENE MONTILLA 68 y.o. -returns for follow-up.  At this point in time she is continue to stable.  She is taking Eliquis  for pulmonary embolism from February 2025.  No adverse events.  She continues on Fasenra  for asthma/ABPA and without any adverse events.  She is using albuterol  once a week for rescue for  cough or wheezing.      Simple office walk 224 (66+46 x 2) feet Pod A at [facility] x  3 laps goal with forehead probe 04/28/2023    O2 used ra   Number laps completed 2   Comments about  pace normal   Resting Pulse Ox/HR 98% and 71/min   Final Pulse Ox/HR 98% and 85/min   Desaturated </= 88% no   Desaturated <= 3% points no   Got Tachycardic >/= 90/min nno   Symptoms at end of test Mild    Miscellaneous comments x      PFT     Latest Ref Rng & Units 10/19/2017   10:47 AM  PFT Results  FVC-Pre L 2.65   FVC-Predicted Pre % 86   FVC-Post L 2.57   FVC-Predicted Post % 84   Pre FEV1/FVC % % 67   Post FEV1/FCV % % 70   FEV1-Pre L 1.79   FEV1-Predicted Pre % 75   FEV1-Post L 1.80   DLCO uncorrected ml/min/mmHg 19.51   DLCO UNC% % 90   DLVA Predicted % 104   TLC L 4.90   TLC % Predicted % 103   RV % Predicted % 118        LAB RESULTS last 96 hours No results found.       has a past medical history of Allergic bronchopulmonary aspergillosis (HCC) (2008), Anemia, Anxiety, Asthma, Breast cancer (HCC), CAD (coronary artery disease), CAP (community acquired pneumonia) (2016; 06/07/2016), Chronic bronchitis (HCC), Chronic lower back pain, Complication of anesthesia, Depression, Diverticulitis, Diverticulosis, GERD (gastroesophageal reflux disease), H/O hiatal hernia, Headache, History of echocardiogram, History of radiation therapy, Hyperglycemia (11/20/2015), Hyperlipidemia, mixed (09/11/2007), IBS (irritable bowel syndrome), Maxillary sinusitis, Normal cardiac stress test (11/2011), Obesity, OSA (obstructive sleep apnea) (02/2012), Osteoarthritis, Osteoporosis, Personal history of radiation therapy, Pneumonia (11/2011), Pulmonary nodules, S/P angioplasty with stent 08/23/17 ostial 2nd OM with DES synnergy (08/24/2017), and Schatzki's ring.   reports that she has never smoked. She has never been exposed to tobacco smoke. She has never used smokeless tobacco.  Past Surgical  History:  Procedure Laterality Date   ANTERIOR AND POSTERIOR REPAIR N/A 10/18/2022   Procedure: ANTERIOR (CYSTOCELE);  Surgeon: Thurman Flores, MD;  Location: [facility] OR;  Service: Gynecology;  Laterality: N/A;   APPENDECTOMY  1989   BREAST BIOPSY  11/03/2022   MM LT RADIOACTIVE SEED LOC MAMMO GUIDE 11/03/2022 [facility] MAMMO[facility]APHY   BREAST LUMPECTOMY WITH RADIOACTIVE SEED LOCALIZATION Left 11/04/2022   Procedure: LEFT BREAST LUMPECTOMY WITH RADIOACTIVE SEED LOCALIZATION;  Surgeon: Caralyn Chandler, MD;  Location: [facility];  Service: General;  Laterality: Left;   CARDIAC CATHETERIZATION N/A 11/25/2014   Procedure: Right/Left Heart Cath and Coronary Angiography;  Surgeon: Arty Binning, MD;  Location: [facility] INVASIVE CV LAB;  Service: Cardiovascular;  Laterality: N/A;   CESAREAN SECTION  1985   COLONOSCOPY  03/2022   CORONARY ANGIOPLASTY WITH STENT PLACEMENT  08/23/2017   CORONARY STENT INTERVENTION N/A 08/23/2017   Procedure: CORONARY STENT INTERVENTION;  Surgeon: Odie Benne, MD;  Location: [facility] INVASIVE CV LAB;  Service: Cardiovascular;  Laterality: N/A;   HERNIA REPAIR  04/13/2012   VHR laparoscopic   LAPAROSCOPIC BILATERAL SALPINGO OOPHERECTOMY Bilateral 10/18/2022   Procedure: LAPAROTOMY WITH BILATERAL SALPINGO OOPHORECTOMY;  Surgeon: Thurman Flores, MD;  Location: [facility] OR;  Service: Gynecology;  Laterality: Bilateral;   LEFT HEART CATH AND CORONARY ANGIOGRAPHY N/A 08/23/2017   Procedure: LEFT HEART CATH AND CORONARY ANGIOGRAPHY;  Surgeon: Odie Benne, MD;  Location: [facility] INVASIVE CV LAB;  Service: Cardiovascular;  Laterality: N/A;   VAGINAL HYSTERECTOMY N/A 10/18/2022   Procedure: HYSTERECTOMY VAGINAL;  Surgeon: Thurman Flores, MD;  Location: [facility] OR;  Service: Gynecology;  Laterality: N/A;   VENTRAL HERNIA REPAIR  04/13/2012   Procedure: LAPAROSCOPIC VENTRAL HERNIA;  Surgeon: Levert Ready, MD;  Location: [facility] OR;  Service: General;  Laterality: N/A;  laparoscopic  repair of incarcerated hernia    Allergies  Allergen Reactions   Latuda  [Lurasidone  Hcl] Other (See Comments)    PER THE PT, CAUSED RESTLESSNESS   Beclomethasone Dipropionate Hives and Other (See Comments)    Weight gain, also   Flexeril  [Cyclobenzaprine ] Anxiety   Mometasone  Furo-Formoterol Fum Hives and Other (See Comments)    Weight gain, also   Sulfonamide Derivatives Hives and Rash   Statins Other (See Comments)    Myalgias, RLS   Fluticasone  Rash and Other (See Comments)    "Weight gain," too    Immunization History  Administered Date(s) Administered   Influenza Split 04/20/2011, 04/20/2020   Influenza Whole 06/06/2007, 04/15/2008, 04/02/2009, 03/29/2012   Influenza, High Dose Seasonal PF 05/05/2015   Influenza,inj,Quad PF,6+ Mos 05/09/2013, 03/03/2014, 05/03/2016, 04/21/2017, 04/02/2018, 03/19/2019, 04/20/2020, 05/03/2021, 03/07/2022   Influenza-Unspecified 04/17/2023   Moderna Sars-Covid-2 Vaccination 08/15/2019, 09/17/2019, 05/16/2020, 12/04/2020   PNEUMOCOCCAL CONJUGATE-20 04/25/2023   Pfizer Covid-19 Vaccine Bivalent Booster 67yrs & up 04/16/2021   Pneumococcal Conjugate-13 05/09/2013   Pneumococcal Polysaccharide-23 05/04/2005   Td 07/29/2009   Tdap 03/11/2015   Zoster Recombinant(Shingrix) 05/09/2019, 08/01/2019    Family History  Problem Relation Age of Onset   Breast cancer Mother 42       bilateral breast cancer dx. 60, met to liver   Hypertension Mother    Diabetes Mother    Diverticulosis Father    Prostate cancer Father    Breast cancer Sister 45       DCIS at 50, IDC at 44, PALB2+   Cancer Sister        breast cancer, invasive ductal carcinoma in 2022,DCIS at 45 with 4 weeks of radiation, 5 years of Tamoxifen     Pulmonary embolism Brother        recurrent   Heart attack Maternal Grandfather    Ovarian cancer Paternal Grandmother    Cerebral palsy Son    Prostate cancer Paternal Uncle    Breast cancer Niece 71       PALB2+   Osteoporosis  Niece    Stroke Neg Hx    Colon cancer Neg Hx    Esophageal cancer Neg Hx    Stomach cancer Neg Hx    Rectal cancer Neg Hx      Current Outpatient Medications:    acetaminophen  (TYLENOL ) 650 MG CR tablet, Take 650-1,300 mg by mouth every 8 (eight) hours as needed for pain., Disp: , Rfl:    albuterol  (VENTOLIN  HFA) 108 (90 Base) MCG/ACT inhaler, Inhale 1-2 puffs into the lungs every 6 (six) hours as needed., Disp: 8 g, Rfl: 2   apixaban  (ELIQUIS ) 5 MG TABS tablet, Take 1 tablet (5 mg total) by mouth 2 (two) times daily., Disp: 60 tablet, Rfl: 4   ascorbic acid (VITAMIN C) 500 MG tablet, Take 500 mg by mouth every evening., Disp: , Rfl:    Benralizumab  (FASENRA  PEN) 30 MG/ML SOAJ, Inject 1 mL (30 mg total) into the skin every 8 (eight) weeks., Disp: 1 mL, Rfl: 2   Calcium -Magnesium -Vitamin D  (CALCIUM  MAGNESIUM  PO), Take 1 tablet by mouth at bedtime., Disp: , Rfl:    clonazePAM  (KLONOPIN ) 0.5 MG tablet, Take 1 tablet (0.5 mg total) by mouth in the morning., Disp: 30 tablet, Rfl: 1   denosumab  (PROLIA ) 60 MG/ML SOSY injection, Inject 60 mg into the skin every  6 (six) months., Disp: , Rfl:    doxepin  (SINEQUAN ) 25 MG capsule, Take 1 capsule (25 mg total) by mouth at bedtime., Disp: 30 capsule, Rfl: 3   Evolocumab  (REPATHA  SURECLICK) 140 MG/ML SOAJ, Inject 140 mg into the skin every 14 (fourteen) days. (Patient taking differently: Inject 140 mg into the skin every 21 ( twenty-one) days.), Disp: 2 mL, Rfl: 11   famotidine  (PEPCID ) 20 MG tablet, TAKE 1 TABLET BY MOUTH TWICE A DAY (Patient taking differently: Take 20 mg by mouth every evening.), Disp: 60 tablet, Rfl: 11   ipratropium-albuterol  (DUONEB) 0.5-2.5 (3) MG/3ML SOLN, Take 3 mLs by nebulization every 6 (six) hours as needed. (Patient taking differently: Take 3 mLs by nebulization every 6 (six) hours as needed (for shortness of breazth or wheezing).), Disp: 120 mL, Rfl: 5   nitroGLYCERIN  (NITROSTAT ) 0.4 MG SL tablet, Place 1 tablet (0.4 mg  total) under the tongue every 5 (five) minutes as needed for chest pain., Disp: 25 tablet, Rfl: 11   pindolol  (VISKEN ) 5 MG tablet, Take 1 tablet (5 mg total) by mouth daily after breakfast., Disp: 30 tablet, Rfl: 3   risperiDONE  (RISPERDAL ) 0.5 MG tablet, Take 1 tablet (0.5 mg total) by mouth 2 (two) times daily. Will start the 0.5 mg BID dosing in 2 weeks, Disp: 60 tablet, Rfl: 2   Semaglutide ,0.25 or 0.5MG /DOS, 2 MG/1.5ML SOPN, Inject 0.25 mg into the skin every Friday., Disp: , Rfl:    venlafaxine  XR (EFFEXOR -XR) 75 MG 24 hr capsule, TAKE 3 CAPSULES (225 MG TOTAL) BY MOUTH DAILY WITH BREAKFAST., Disp: 90 capsule, Rfl: 2  Current Facility-Administered Medications:    [START ON 11/06/2023] denosumab  (PROLIA ) injection 60 mg, 60 mg, Subcutaneous, Once, Neda Balk, MD      Objective:   Vitals:   10/30/23 1134 10/30/23 1135  BP:  122/70  Pulse: 94   SpO2: 97%   Weight:  166 lb (75.3 kg)  Height:  5\' 2"  (1.575 m)    Estimated body mass index is 30.36 kg/m as calculated from the following:   Height as of this encounter: 5\' 2"  (1.575 m).   Weight as of this encounter: 166 lb (75.3 kg).  @WEIGHTCHANGE @  [employer]   10/30/23 1135  Weight: 166 lb (75.3 kg)     Physical Exam   General: No distress. Looks well O2 at rest: no Cane present: no Sitting in wheel chair: no Frail: no Obese: no Neuro: Alert and Oriented x 3. GCS 15. Speech normal Psych: Pleasant Resp:  Barrel Chest - no.  Wheeze - no, Crackles - no, No overt respiratory distress CVS: Normal heart sounds. Murmurs - no Ext: Stigmata of Connective Tissue Disease - no HEENT: Normal upper airway. PEERL +. No post nasal drip        Assessment:       ICD-10-CM   1. Other acute pulmonary embolism without acute cor pulmonale (HCC)  I26.99     2. Severe persistent asthma with intensive monitoring  J45.50     3. A B P A-ALLERGIC BRONCHOPULMONARY ASPERGILLOSIS  B44.81          Plan:     Patient  Instructions  Other acute pulmonary embolism without acute cor pulmonale (HCC)  -Diagnosed 07/28/2023.  Small clot burden probably related to tamoxifen   Plan - Continue Eliquis  at full dose for at least 12 months and then reassess with a D-dimer and decide low-dose protocol for another 6-12 months  -MONITOR  for bleeding -  Do not do tamoxifen  -recheck d-dimer at followup  Severe persistent asthma with intensive monitoring  -Current stable  Plan - Continue current treatment as before (Fasentra, etc)  Follow-up - 6 months with nurse practitioner or sooner if needed; okay to see Aisha Hove Return in about 6 months (around 05/01/2024) for 15 min visit, Pulmonary Embolism, with Dr Bertrum Brodie, Asthma, Face to Face Visit.    SIGNATURE    Dr. Maire Scot, M.D., F.C.C.P,  Pulmonary and Critical Care Medicine Staff Physician, [facility] Director - Interstitial Lung Disease  Program  Pulmonary Fibrosis [facility] at [facility] [place], [place], [address]  Pager: [phone], If no answer or between  15:00h - 7:00h: call [phone] Telephone: [phone]  12:07 PM 10/30/2023

## 2023-10-31 ENCOUNTER — Ambulatory Visit: Payer: PPO | Admitting: Family Medicine

## 2023-10-31 DIAGNOSIS — M542 Cervicalgia: Secondary | ICD-10-CM | POA: Diagnosis not present

## 2023-10-31 DIAGNOSIS — M4120 Other idiopathic scoliosis, site unspecified: Secondary | ICD-10-CM | POA: Diagnosis not present

## 2023-10-31 DIAGNOSIS — M545 Low back pain, unspecified: Secondary | ICD-10-CM | POA: Diagnosis not present

## 2023-10-31 LAB — FERRITIN: Ferritin: 19 ng/mL (ref 11–307)

## 2023-11-03 ENCOUNTER — Telehealth: Payer: Self-pay

## 2023-11-03 NOTE — Telephone Encounter (Signed)
 Prolia  VOB initiated via [email or web address]  Next Prolia  inj DUE: 12/07/23

## 2023-11-06 ENCOUNTER — Other Ambulatory Visit (HOSPITAL_COMMUNITY): Payer: Self-pay

## 2023-11-06 NOTE — Telephone Encounter (Signed)
 Pt ready for scheduling for PROLIA  on or after : 12/07/23  Option# 1: Buy/Bill (Office supplied medication)  Out-of-pocket cost due at time of clinic visit: $332  Number of injection/visits approved: ---  Primary: [employer] Prolia  co-insurance: 20% Admin fee co-insurance: 0%  Secondary: --- Prolia  co-insurance:  Admin fee co-insurance:   Medical Benefit Details: Date Benefits were checked: 11/06/23 Deductible: NO/ Coinsurance: 20%/ Admin Fee: 0%  Prior Auth: N/A PA# Expiration Date:   # of doses approved: ----------------------------------------------------------------------- Option# 2- Med Obtained from pharmacy:  Pharmacy benefit: Copay $250 (Paid to pharmacy) Admin Fee: 0% (Pay at clinic)  Prior Auth: N/A PA# Expiration Date:   # of doses approved:   If patient wants fill through the pharmacy benefit please send prescription to: [employer], and include estimated need by date in rx notes. Pharmacy will ship medication directly to the office.  Patient NOT eligible for Prolia  Copay Card. Copay Card can make patient's cost as little as $25. Link to apply: [email or web address]://[email or web address]  ** This summary of benefits is an estimation of the patient's out-of-pocket cost. Exact cost may very based on individual plan coverage.

## 2023-11-07 ENCOUNTER — Telehealth: Payer: Self-pay | Admitting: *Deleted

## 2023-11-07 NOTE — Telephone Encounter (Signed)
 Left message on machine to call back to schedule Prolia  on or after 12/08/23.  Cost would be cheaper through pharmacy at $250 or through medical through office would be $332.  Please let us  know which you would like to do.  Please schedule  patient and let us  know if she would like to get through pharmacy or office.

## 2023-11-07 NOTE — Telephone Encounter (Signed)
 Copied from CRM [id number]. Topic: Clinical - Medication Question >> Nov 07, 2023  3:54 PM Baldo Levan wrote: Reason for CRM: Patient returning a call stating she would like to get the Prolia  through the office, patient is aware it would cost more this way.

## 2023-11-08 NOTE — Telephone Encounter (Signed)
 Pt scheduled for 12/12/23

## 2023-11-11 ENCOUNTER — Other Ambulatory Visit (HOSPITAL_COMMUNITY): Payer: Self-pay | Admitting: Psychiatry

## 2023-11-11 DIAGNOSIS — F419 Anxiety disorder, unspecified: Secondary | ICD-10-CM

## 2023-11-11 DIAGNOSIS — F32A Depression, unspecified: Secondary | ICD-10-CM

## 2023-11-14 DIAGNOSIS — M545 Low back pain, unspecified: Secondary | ICD-10-CM | POA: Diagnosis not present

## 2023-11-14 DIAGNOSIS — M542 Cervicalgia: Secondary | ICD-10-CM | POA: Diagnosis not present

## 2023-11-14 DIAGNOSIS — M4120 Other idiopathic scoliosis, site unspecified: Secondary | ICD-10-CM | POA: Diagnosis not present

## 2023-11-21 DIAGNOSIS — L821 Other seborrheic keratosis: Secondary | ICD-10-CM | POA: Diagnosis not present

## 2023-11-21 DIAGNOSIS — M793 Panniculitis, unspecified: Secondary | ICD-10-CM | POA: Diagnosis not present

## 2023-11-21 DIAGNOSIS — L578 Other skin changes due to chronic exposure to nonionizing radiation: Secondary | ICD-10-CM | POA: Diagnosis not present

## 2023-11-21 DIAGNOSIS — D1800 Hemangioma unspecified site: Secondary | ICD-10-CM | POA: Diagnosis not present

## 2023-11-21 DIAGNOSIS — Z85828 Personal history of other malignant neoplasm of skin: Secondary | ICD-10-CM | POA: Diagnosis not present

## 2023-11-21 DIAGNOSIS — D229 Melanocytic nevi, unspecified: Secondary | ICD-10-CM | POA: Diagnosis not present

## 2023-11-21 DIAGNOSIS — L814 Other melanin hyperpigmentation: Secondary | ICD-10-CM | POA: Diagnosis not present

## 2023-11-23 ENCOUNTER — Other Ambulatory Visit: Payer: Self-pay

## 2023-11-23 ENCOUNTER — Ambulatory Visit (HOSPITAL_COMMUNITY)

## 2023-11-23 ENCOUNTER — Telehealth: Payer: Self-pay | Admitting: Gastroenterology

## 2023-11-23 DIAGNOSIS — R1032 Left lower quadrant pain: Secondary | ICD-10-CM

## 2023-11-23 NOTE — Telephone Encounter (Signed)
 Pt states she is having a diverticuliis flare. States it started yesterday. She has LLQ abd pain and soreness. Reports she had 3-4 diarrhea stools yesterday. Felt warm in the middle of the night but did not take her temp. Requesting antibiotics be called in, please advise.

## 2023-11-23 NOTE — Telephone Encounter (Signed)
 In review of her records, CTAP 06/2020 showed diffuse colonic diverticulosis with short segment wall thickening involving the sigmoid colon suggestive of mild colitis/diverticulitis. She was treated with Cipro  and Flagyl  for suspected possible diverticulitis then subsequent colonoscopy 03/2022 showed microscopic colitis.   Victoria [name], pls send patient to our lab for a CBC, CMP and CRP. Stool culture, O & P and C. Diff PCR. Less likely diverticulitis if she is having diarrhea. Schedule her for a stat CTAP with oral and IV contrast. Push fluids, bland diet. Patient to go to the ED if she develops severe abdominal pain.

## 2023-11-23 NOTE — Telephone Encounter (Signed)
 Noted

## 2023-11-23 NOTE — Telephone Encounter (Signed)
 Pt aware of recommendations per [facility]. Order for labs in and pt scheduled for CT of A?P at [facility] today she needs to arrive there at 3:15pm. Not sure if pt will complete the recommendations states she is feeling some better.

## 2023-11-23 NOTE — Telephone Encounter (Signed)
 Patient called and stated that she was actually feel much better then before and will not be coming in to get lab work done, as well as not be coming in for that CT at the hospital. Please advise.

## 2023-11-23 NOTE — Telephone Encounter (Signed)
 Dr Bridgett Camps pt last seen by Camilo Cella in January

## 2023-11-23 NOTE — Telephone Encounter (Signed)
 Patient called and stated that she is having LLQ pain and is also having a diverticulitis flare up. Patient is wondering if she can get a medication sent over to her pharmacy. Patient is requesting a call back to inform her of what she should do. Please advise.

## 2023-11-24 ENCOUNTER — Ambulatory Visit: Payer: Self-pay | Admitting: Hematology

## 2023-11-24 ENCOUNTER — Other Ambulatory Visit: Payer: Self-pay

## 2023-11-24 NOTE — Telephone Encounter (Addendum)
 Called patient to relay message below as per Dr.Feng, patient voiced full understanding and had no further questions at this time.     ----- Message from Sonja Vernon sent at 11/24/2023 12:48 PM EDT ----- Please let pt know her iron  is slightly low again, please arrange iv venofer  600mg  in next 2-4 weeks, thx   Sonja [place]

## 2023-11-29 ENCOUNTER — Other Ambulatory Visit: Payer: Self-pay | Admitting: Pharmacist

## 2023-11-29 ENCOUNTER — Telehealth: Payer: Self-pay | Admitting: *Deleted

## 2023-11-29 ENCOUNTER — Encounter: Payer: Self-pay | Admitting: Family Medicine

## 2023-11-29 DIAGNOSIS — J4550 Severe persistent asthma, uncomplicated: Secondary | ICD-10-CM

## 2023-11-29 MED ORDER — FASENRA PEN 30 MG/ML ~~LOC~~ SOAJ
30.0000 mg | SUBCUTANEOUS | 2 refills | Status: DC
Start: 2023-11-29 — End: 2023-11-29

## 2023-11-29 MED ORDER — FASENRA PEN 30 MG/ML ~~LOC~~ SOAJ: 30.0000 mg | SUBCUTANEOUS | 2 refills | Status: DC

## 2023-11-29 NOTE — Telephone Encounter (Signed)
 Copied from CRM [id number]. Topic: Clinical - Medication Question >> Nov 29, 2023 11:16 AM Chuck Crater wrote: Reason for CRM: Patient wants to ask nurse a question in regards to Prolia  shot. Patient is wondering if it is too late to get the Prolia  shot from pharmacy instead of at the clinic.

## 2023-11-29 NOTE — Telephone Encounter (Signed)
 Refill sent for FASENRA  to [facility]): [phone]  Dose: 30mg  subcut every 8 weeks  Last OV: 10/30/23 Provider: Dr. Bertrum Brodie  Next OV: due in Nov 2025, not yet scheduled  Geraldene Kleine, PharmD, MPH, BCPS Clinical Pharmacist (Rheumatology and Pulmonology)

## 2023-11-30 DIAGNOSIS — M4120 Other idiopathic scoliosis, site unspecified: Secondary | ICD-10-CM | POA: Diagnosis not present

## 2023-11-30 DIAGNOSIS — M542 Cervicalgia: Secondary | ICD-10-CM | POA: Diagnosis not present

## 2023-11-30 DIAGNOSIS — M545 Low back pain, unspecified: Secondary | ICD-10-CM | POA: Diagnosis not present

## 2023-11-30 NOTE — Telephone Encounter (Signed)
 Patient thought she could get from her local pharmacy.  Advised that it has to come from a specialty pharmacy.  Pt will just get from us .

## 2023-12-01 ENCOUNTER — Telehealth (HOSPITAL_COMMUNITY): Admitting: Psychiatry

## 2023-12-05 DIAGNOSIS — M542 Cervicalgia: Secondary | ICD-10-CM | POA: Diagnosis not present

## 2023-12-05 DIAGNOSIS — M545 Low back pain, unspecified: Secondary | ICD-10-CM | POA: Diagnosis not present

## 2023-12-05 DIAGNOSIS — M4120 Other idiopathic scoliosis, site unspecified: Secondary | ICD-10-CM | POA: Diagnosis not present

## 2023-12-11 ENCOUNTER — Other Ambulatory Visit

## 2023-12-12 ENCOUNTER — Other Ambulatory Visit (INDEPENDENT_AMBULATORY_CARE_PROVIDER_SITE_OTHER)

## 2023-12-12 ENCOUNTER — Ambulatory Visit: Payer: Self-pay | Admitting: Family Medicine

## 2023-12-12 ENCOUNTER — Ambulatory Visit (INDEPENDENT_AMBULATORY_CARE_PROVIDER_SITE_OTHER): Admitting: *Deleted

## 2023-12-12 DIAGNOSIS — M810 Age-related osteoporosis without current pathological fracture: Secondary | ICD-10-CM | POA: Diagnosis not present

## 2023-12-12 DIAGNOSIS — R739 Hyperglycemia, unspecified: Secondary | ICD-10-CM

## 2023-12-12 DIAGNOSIS — E782 Mixed hyperlipidemia: Secondary | ICD-10-CM | POA: Diagnosis not present

## 2023-12-12 DIAGNOSIS — I5032 Chronic diastolic (congestive) heart failure: Secondary | ICD-10-CM | POA: Diagnosis not present

## 2023-12-12 LAB — CBC WITH DIFFERENTIAL/PLATELET
Basophils Absolute: 0 10*3/uL (ref 0.0–0.1)
Basophils Relative: 0.2 % (ref 0.0–3.0)
Eosinophils Absolute: 0 10*3/uL (ref 0.0–0.7)
Eosinophils Relative: 0 % (ref 0.0–5.0)
HCT: 34.5 % — ABNORMAL LOW (ref 36.0–46.0)
Hemoglobin: 11.1 g/dL — ABNORMAL LOW (ref 12.0–15.0)
Lymphocytes Relative: 16 % (ref 12.0–46.0)
Lymphs Abs: 0.7 10*3/uL (ref 0.7–4.0)
MCHC: 32.3 g/dL (ref 30.0–36.0)
MCV: 86.1 fl (ref 78.0–100.0)
Monocytes Absolute: 0.5 10*3/uL (ref 0.1–1.0)
Monocytes Relative: 10.4 % (ref 3.0–12.0)
Neutro Abs: 3.2 10*3/uL (ref 1.4–7.7)
Neutrophils Relative %: 73.4 % (ref 43.0–77.0)
Platelets: 289 10*3/uL (ref 150.0–400.0)
RBC: 4 Mil/uL (ref 3.87–5.11)
RDW: 15.9 % — ABNORMAL HIGH (ref 11.5–15.5)
WBC: 4.4 10*3/uL (ref 4.0–10.5)

## 2023-12-12 LAB — LIPID PANEL
Cholesterol: 134 mg/dL (ref 0–200)
HDL: 54.7 mg/dL (ref >39.00)
LDL Cholesterol: 60 mg/dL (ref 0–99)
NonHDL: 79.42
Total CHOL/HDL Ratio: 2
Triglycerides: 98 mg/dL (ref 0.0–149.0)
VLDL: 19.6 mg/dL (ref 0.0–40.0)

## 2023-12-12 LAB — COMPREHENSIVE METABOLIC PANEL WITH GFR
ALT: 14 U/L (ref 0–35)
AST: 14 U/L (ref 0–37)
Albumin: 4 g/dL (ref 3.5–5.2)
Alkaline Phosphatase: 82 U/L (ref 39–117)
BUN: 14 mg/dL (ref 6–23)
CO2: 32 meq/L (ref 19–32)
Calcium: 9.2 mg/dL (ref 8.4–10.5)
Chloride: 99 meq/L (ref 96–112)
Creatinine, Ser: 0.69 mg/dL (ref 0.40–1.20)
GFR: 90.32 mL/min (ref >60.00)
Glucose, Bld: 85 mg/dL (ref 70–99)
Potassium: 4 meq/L (ref 3.5–5.1)
Sodium: 136 meq/L (ref 135–145)
Total Bilirubin: 0.3 mg/dL (ref 0.2–1.2)
Total Protein: 6.2 g/dL (ref 6.0–8.3)

## 2023-12-12 LAB — TSH: TSH: 2.29 u[IU]/mL (ref 0.35–5.50)

## 2023-12-12 LAB — HEMOGLOBIN A1C: Hgb A1c MFr Bld: 5.5 % (ref 4.6–6.5)

## 2023-12-12 LAB — VITAMIN D 25 HYDROXY (VIT D DEFICIENCY, FRACTURES): VITD: 64.62 ng/mL (ref 30.00–100.00)

## 2023-12-12 MED ORDER — DENOSUMAB 60 MG/ML ~~LOC~~ SOSY: 60.0000 mg | PREFILLED_SYRINGE | SUBCUTANEOUS | Status: DC

## 2023-12-12 NOTE — Progress Notes (Signed)
 Patient here for Prolia  injection per physicians orders  Prolia  60 mg SQ , was administered right arm today. Patient tolerated injection.  Patient next injection due: 6 months, appt made:  No- will schedule in 5 months after benefits are ran again  Initial injection: no  Did Prolia  come from pharmacy (if yes please select patient supplied): no  Cam placed for next injection: yes

## 2023-12-14 ENCOUNTER — Encounter: Payer: Self-pay | Admitting: Family Medicine

## 2023-12-14 DIAGNOSIS — M545 Low back pain, unspecified: Secondary | ICD-10-CM | POA: Diagnosis not present

## 2023-12-14 DIAGNOSIS — M542 Cervicalgia: Secondary | ICD-10-CM | POA: Diagnosis not present

## 2023-12-14 DIAGNOSIS — M4120 Other idiopathic scoliosis, site unspecified: Secondary | ICD-10-CM | POA: Diagnosis not present

## 2023-12-15 ENCOUNTER — Other Ambulatory Visit: Payer: Self-pay | Admitting: Family Medicine

## 2023-12-15 DIAGNOSIS — M549 Dorsalgia, unspecified: Secondary | ICD-10-CM

## 2023-12-18 NOTE — Progress Notes (Unsigned)
 BH MD/PA/NP OP Progress Note  12/21/2023 4:22 PM Victoria [name]  MRN:  [id number]  Visit Diagnosis:    ICD-10-CM   1. Anxiety and depression  F41.9 risperiDONE  (RISPERDAL ) 0.5 MG tablet   F32.A venlafaxine  XR (EFFEXOR -XR) 75 MG 24 hr capsule    pindolol  (VISKEN ) 5 MG tablet    doxepin  (SINEQUAN ) 25 MG capsule    clonazePAM  (KLONOPIN ) 0.5 MG tablet      Assessment: Victoria [name] is a 68 y.o. female with a history of anxiety, depression, OSA who presented to [facility] at [facility] for initial evaluation on 06/30/2022.  At initial evaluation patient reported symptoms of anxiety including feeling nervous or on edge, being unable to stop controlled worrying, worrying too much about different things, and fear that something awful would happen.  Patient's symptoms have been gradually progressing.  There was a period in November 2023 where she was having passive SI without intent or plan.  Patient also did endorse low mood, poor sleep, difficulty concentrating.  She does have a history of obstructive sleep apnea and does not use CPAP with last sleep study being over 10 years ago. Psychosocially patient has increased stress of caring for her adult son with cerebral palsy and planning the next steps for him after she and her husband pass or are no longer able to care for him.  Patient met criteria for MDD and generalized anxiety disorder.   Victoria [name] presents for follow-up evaluation. Today, 12/21/23, patient reports that depression as same to her completely reside roughly 3 weeks ago.  Patient has not restarted behavioral activation during this time.  She has also improved her ability to identify activities and purpose to complete with the free time she has now that her son has consistent care.  Anxiety symptoms have also improved with patient reporting barely noticeable morning anxiety is the only symptom.  She tolerated the taper of risperidone  0.5 mg twice a day well without  significant adverse side effects.  Given this would be appropriate to decrease to 0.5 mg at bedtime today.  We reviewed the risk and benefits.  We will continue on the remainder of her current regimen and follow up in 2 months.  Of note patient has had no change in weight.  She intends to stop the semaglutide  couple weeks.  We can reconsider metformin  in the future patient does remain on Risperdal .  Psychotherapeutic interventions were used during today's session. From 2:35 PM to 2:55 PM we used empathic listening techniques and provided support. Used supportive interviewing techniques to validate patients feelings. Worked on cognitive re framing techniques and focusing on behavioral activation.  Improvement was evidenced by patient's participation.  Plan: - Continue Effexor  XR 150 mg QAM and 75 mg QHS - Decrease Risperdal  0.5 mg at bedtime - Continue Doxepin  25 mg QHS - Continue Klonopin  0.5 mg QD prn for anxiety - Continue pindolol  5 mg every day - Continue Semaglutide  in the interim managed by an online provider, patient plans to DC in a couple weeks due to lack of benefit. - Recommend repeat sleep study, could consider dental devices OSA still present - CMP, CBC, lipid profile, Vit D, A1c, TSH reviewed - Continue with therapy every week through [place] therapy - Completed a 10 week wellness group through the [facility], followed by a group for special needs parents - Discussed other support groups - Crisis resources reviewed - Follow up in a month  Chief Complaint:  Chief Complaint  Patient  presents with   Follow-up   HPI: On presentation today Victoria [name] reports that she has been going pretty good since the middle of June. Initially after [name] moved out she was more depressed and not sure what to do with her free time. This has been got better recently. Victoria [name] has been going to [employer] and doing volunteer work more recently. She has also been using the swimming pool a bit more which she enjoyed.  Has a 9 day road trip planned in a few weeks during which they are going to visit family and friends.  Victoria [name] is looking forward to the trip.  Her anxiety has also been well controlled even during the mornings.  She has barely noticed the internal restlessness that she had used to endorse in the past.  Patient does continue to take the Klonopin  0.5 mg daily.  Victoria [name] tolerated the decrease in Risperdal  to 0.5 mg twice a day well without significant issue.  There was a couple day transient increase in anxiety that has improved.  Given patient's stability we discussed continuing taper of the risperidone .  We will discontinue the morning dose and reviewed the risk and benefits.  Patient was agreeable and notes that she also plans to discuss Victoria [name] a few weeks as there has been no change in weight despite being on the medicine for 6 months.  Past Psychiatric History: One  prior psychiatric hospitalization to [facility] in February of 2024 due to increased depression/anxiety. No prior suicide attempts.  Patient did endorse passive SI in November of 2023 without any intent or plan.  She denies being connected with a psychiatrist in the past and had recently connected with a therapist at [facility] therapy.  Has taken Effexor  (possible restlessness at 225), Cymbalta , Remeron , Ativan , Xanax , Klonopin , gabapentin , BuSpar , Zyprexa  (TD), Latuda  (akathisia), Risperdal , Doxepin ,   Denies substance use other than 1 beer a day. Can increase to 2 when the weather is nice.  Past Medical History:  Past Medical History:  Diagnosis Date   Allergic bronchopulmonary aspergillosis (HCC) 2008   sees Dr Katheren Finn pulmonology   Anemia    iron  deficiency, resolved   Anxiety    Asthma    Breast cancer (HCC)    CAD (coronary artery disease)    a. LHC 6/16:  oOM1 60, pRCA 25 >> med Rx b. cath 3/19 2nd OM with 95% stenosis s/p synergy DES & anomalous RCA   CAP (community acquired pneumonia) 2016; 06/07/2016   Chronic  bronchitis (HCC)    Chronic lower back pain    Complication of anesthesia    think I have a hard time waking up from it   Depression    mild   Diverticulitis    Diverticulosis    GERD (gastroesophageal reflux disease)    H/O hiatal hernia    Headache    weekly (08/23/2017)   History of echocardiogram    Echo 6/16:  Mod LVH, EF 60-65%, no RWMA, Gr 1 DD, trivial MR, normal LA size.   History of radiation therapy    Left breast- 12/26/22-01/23/23-Dr. Lynwood Nasuti   Hyperglycemia 11/20/2015   Hyperlipidemia, mixed 09/11/2007   Qualifier: Diagnosis of  By: Antonio ROSALEA Rockers   Did not tolerate Lipitor, zocor, Lovastatin, Pravastatin, Livalo , Crestor  even low dose    IBS (irritable bowel syndrome)    Maxillary sinusitis    Normal cardiac stress test 11/2011   No evidence of ischemia or infarct.   Calculated ejection fraction 72%.   Obesity  OSA (obstructive sleep apnea) 02/2012   has stopped using  cpap   Osteoarthritis    Osteoporosis    Personal history of radiation therapy    Pneumonia 11/2011   before 2013 I hadn't had pneumonia since I was a child (04/13/2012)   Pulmonary nodules    S/P angioplasty with stent 08/23/17 ostial 2nd OM with DES synnergy 08/24/2017   Schatzki's ring     Past Surgical History:  Procedure Laterality Date   ANTERIOR AND POSTERIOR REPAIR N/A 10/18/2022   Procedure: ANTERIOR (CYSTOCELE);  Surgeon: Mat Browning, MD;  Location: [facility] OR;  Service: Gynecology;  Laterality: N/A;   APPENDECTOMY  1989   BREAST BIOPSY  11/03/2022   MM LT RADIOACTIVE SEED LOC MAMMO GUIDE 11/03/2022 [facility] MAMMOGRAPHY   BREAST LUMPECTOMY WITH RADIOACTIVE SEED LOCALIZATION Left 11/04/2022   Procedure: LEFT BREAST LUMPECTOMY WITH RADIOACTIVE SEED LOCALIZATION;  Surgeon: Curvin Deward MOULD, MD;  Location: [facility];  Service: General;  Laterality: Left;   CARDIAC CATHETERIZATION N/A 11/25/2014   Procedure: Right/Left Heart Cath and Coronary Angiography;  Surgeon:  Victory LELON Sharps, MD;  Location: [facility] INVASIVE CV LAB;  Service: Cardiovascular;  Laterality: N/A;   CESAREAN SECTION  1985   COLONOSCOPY  03/2022   CORONARY ANGIOPLASTY WITH STENT PLACEMENT  08/23/2017   CORONARY STENT INTERVENTION N/A 08/23/2017   Procedure: CORONARY STENT INTERVENTION;  Surgeon: Verlin Lonni BIRCH, MD;  Location: [facility] INVASIVE CV LAB;  Service: Cardiovascular;  Laterality: N/A;   HERNIA REPAIR  04/13/2012   VHR laparoscopic   LAPAROSCOPIC BILATERAL SALPINGO OOPHERECTOMY Bilateral 10/18/2022   Procedure: LAPAROTOMY WITH BILATERAL SALPINGO OOPHORECTOMY;  Surgeon: Mat Browning, MD;  Location: [facility] OR;  Service: Gynecology;  Laterality: Bilateral;   LEFT HEART CATH AND CORONARY ANGIOGRAPHY N/A 08/23/2017   Procedure: LEFT HEART CATH AND CORONARY ANGIOGRAPHY;  Surgeon: Verlin Lonni BIRCH, MD;  Location: [facility] INVASIVE CV LAB;  Service: Cardiovascular;  Laterality: N/A;   VAGINAL HYSTERECTOMY N/A 10/18/2022   Procedure: HYSTERECTOMY VAGINAL;  Surgeon: Mat Browning, MD;  Location[facility] OR;  Service: Gynecology;  Laterality: N/A;   VENTRAL HERNIA REPAIR  04/13/2012   Procedure: LAPAROSCOPIC VENTRAL HERNIA;  Surgeon: Elon CHRISTELLA Pacini, MD;  Location: [facility] OR;  Service: General;  Laterality: N/A;  laparoscopic repair of incarcerated hernia   Family History:  Family History  Problem Relation Age of Onset   Breast cancer Mother 34       bilateral breast cancer dx. 30, met to liver   Hypertension Mother    Diabetes Mother    Diverticulosis Father    Prostate cancer Father    Breast cancer Sister 47       DCIS at 62, IDC at 3, PALB2+   Cancer Sister        breast cancer, invasive ductal carcinoma in 2022,DCIS at 35 with 4 weeks of radiation, 5 years of Tamoxifen     Pulmonary embolism Brother        recurrent   Heart attack Maternal Grandfather    Ovarian cancer Paternal Grandmother    Cerebral palsy Son    Prostate cancer Paternal Uncle    Breast cancer Niece 77        PALB2+   Osteoporosis Niece    Stroke Neg Hx    Colon cancer Neg Hx    Esophageal cancer Neg Hx    Stomach cancer Neg Hx    Rectal cancer Neg Hx     Social History:  Social History   Socioeconomic  History   Marital status: Married    Spouse name: Not on file   Number of children: 1   Years of education: 14   Highest education level: Associate degree: academic program  Occupational History   Occupation: Disabled   Tobacco Use   Smoking status: Never    Passive exposure: Never   Smokeless tobacco: Never  Vaping Use   Vaping status: Never Used  Substance and Sexual Activity   Alcohol use: Yes    Alcohol/week: 4.0 - 7.0 standard drinks of alcohol    Types: 4 - 7 Standard drinks or equivalent per week   Drug use: No   Sexual activity: Yes    Birth control/protection: Surgical    Comment: gluten free, lives with husband and son with CP quadriplegia  Other Topics Concern   Not on file  Social History Narrative   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    Lives at home with her husband and son.   Right-handed.   2 cups caffeine per day.   One story home   Social Drivers of Health   Financial Resource Strain: Low Risk  (01/18/2023)   Overall Financial Resource Strain (CARDIA)    Difficulty of Paying Living Expenses: Not hard at all  Food Insecurity: No Food Insecurity (07/30/2023)   Hunger Vital Sign    Worried About Running Out of Food in the Last Year: Never true    Ran Out of Food in the Last Year: Never true  Transportation Needs: No Transportation Needs (07/30/2023)   PRAPARE - Administrator, Civil Service (Medical): No    Lack of Transportation (Non-Medical): No  Physical Activity: Inactive (02/10/2023)   Exercise Vital Sign    Days of Exercise per Week: 0 days    Minutes of Exercise per Session: 0 min  Stress: Stress Concern Present (01/18/2023)   [employer] of Occupational Health - Occupational Stress Questionnaire    Feeling of Stress : To some  extent  Social Connections: Socially Integrated (07/29/2023)   Social Connection and Isolation Panel    Frequency of Communication with Friends and Family: More than three times a week    Frequency of Social Gatherings with Friends and Family: Once a week    Attends Religious Services: More than 4 times per year    Active Member of [employer] or Organizations: Yes    Attends Engineer, structural: More than 4 times per year    Marital Status: Married    Allergies:  Allergies  Allergen Reactions   Latuda  [Lurasidone  Hcl] Other (See Comments)    PER THE PT, CAUSED RESTLESSNESS   Beclomethasone Dipropionate Hives and Other (See Comments)    Weight gain, also   Flexeril  [Cyclobenzaprine ] Anxiety   Mometasone  Furo-Formoterol Fum Hives and Other (See Comments)    Weight gain, also   Sulfonamide Derivatives Hives and Rash   Statins Other (See Comments)    Myalgias, RLS   Fluticasone  Rash and Other (See Comments)    Weight gain, too    Current Medications: Current Outpatient Medications  Medication Sig Dispense Refill   acetaminophen  (TYLENOL ) 650 MG CR tablet Take 650-1,300 mg by mouth every 8 (eight) hours as needed for pain.     albuterol  (VENTOLIN  HFA) 108 (90 Base) MCG/ACT inhaler Inhale 1-2 puffs into the lungs every 6 (six) hours as needed. 8 g 2   apixaban  (ELIQUIS ) 5 MG TABS tablet Take 1 tablet (5 mg total) by mouth 2 (two) times  daily. 60 tablet 4   ascorbic acid (VITAMIN C) 500 MG tablet Take 500 mg by mouth every evening.     benralizumab  (FASENRA  PEN) 30 MG/ML prefilled autoinjector Inject 1 mL (30 mg total) into the skin every 8 (eight) weeks. ** AZ&ME PATIENT ASSISTANCE** 1 mL 2   Calcium -Magnesium -Vitamin D  (CALCIUM  MAGNESIUM  PO) Take 1 tablet by mouth at bedtime.     [START ON 01/08/2024] clonazePAM  (KLONOPIN ) 0.5 MG tablet Take 1 tablet (0.5 mg total) by mouth in the morning. 30 tablet 1   denosumab  (PROLIA ) 60 MG/ML SOSY injection Inject 60 mg into the skin every 6  (six) months.     doxepin  (SINEQUAN ) 25 MG capsule Take 1 capsule (25 mg total) by mouth at bedtime. 30 capsule 2   Evolocumab  (REPATHA  SURECLICK) 140 MG/ML SOAJ Inject 140 mg into the skin every 14 (fourteen) days. (Patient taking differently: Inject 140 mg into the skin every 21 ( twenty-one) days.) 2 mL 11   famotidine  (PEPCID ) 20 MG tablet TAKE 1 TABLET BY MOUTH TWICE A DAY (Patient taking differently: Take 20 mg by mouth every evening.) 60 tablet 11   ipratropium-albuterol  (DUONEB) 0.5-2.5 (3) MG/3ML SOLN Take 3 mLs by nebulization every 6 (six) hours as needed. (Patient taking differently: Take 3 mLs by nebulization every 6 (six) hours as needed (for shortness of breazth or wheezing).) 120 mL 5   nitroGLYCERIN  (NITROSTAT ) 0.4 MG SL tablet Place 1 tablet (0.4 mg total) under the tongue every 5 (five) minutes as needed for chest pain. 25 tablet 11   pindolol  (VISKEN ) 5 MG tablet Take 1 tablet (5 mg total) by mouth daily after breakfast. 30 tablet 2   risperiDONE  (RISPERDAL ) 0.5 MG tablet Take 1 tablet (0.5 mg total) by mouth 2 (two) times daily. Will start the 0.5 mg BID dosing in 2 weeks 60 tablet 1   Semaglutide ,0.25 or 0.5MG /DOS, 2 MG/1.5ML SOPN Inject 0.25 mg into the skin every Friday.     venlafaxine  XR (EFFEXOR -XR) 75 MG 24 hr capsule Take 3 capsules (225 mg total) by mouth daily with breakfast. 90 capsule 2   Current Facility-Administered Medications  Medication Dose Route Frequency Provider Last Rate Last Admin   [START ON 06/09/2024] denosumab  (PROLIA ) injection 60 mg  60 mg Subcutaneous Q6 months Domenica Harlene LABOR, MD         Psychiatric Specialty Exam: Review of Systems  There were no vitals taken for this visit.There is no height or weight on file to calculate BMI.  General Appearance: Well Groomed  Eye Contact:  Good  Speech:  Clear and Coherent and Normal Rate  Volume:  Normal  Mood:  Euthymic  Affect:  Appropriate  Thought Process:  Coherent  Orientation:  Full (Time,  Place, and Person)  Thought Content: Logical   Suicidal Thoughts:  No  Homicidal Thoughts:  No  Memory:  Immediate;   Good  Judgement:  Fair  Insight:  Fair  Psychomotor Activity:  Normal  Concentration:  Concentration: Good  Recall:  Good  Fund of Knowledge: Fair  Language: Good  Akathisia:  No    AIMS (if indicated): not done  Assets:  Communication Skills Desire for Improvement Financial Resources/Insurance Housing Transportation  ADL's:  Intact  Cognition: WNL  Sleep:  Good   Metabolic Disorder Labs: Lab Results  Component Value Date   HGBA1C 5.5 12/12/2023   MPG 117 10/22/2008   Lab Results  Component Value Date   PROLACTIN 15.4 08/12/2022   Lab Results  Component Value Date   CHOL 134 12/12/2023   TRIG 98.0 12/12/2023   HDL 54.70 12/12/2023   CHOLHDL 2 12/12/2023   VLDL 19.6 12/12/2023   LDLCALC 60 12/12/2023   LDLCALC 38 04/25/2023   Lab Results  Component Value Date   TSH 2.29 12/12/2023   TSH 1.79 04/25/2023    Therapeutic Level Labs: No results found for: LITHIUM No results found for: VALPROATE No results found for: CBMZ   Screenings: AIMS    Flowsheet Row Admission (Discharged) from 08/12/2022 in [facility] BEHAVIORAL MEDICINE  AIMS Total Score 2   AUDIT    Flowsheet Row Admission (Discharged) from 08/12/2022 in [facility] BEHAVIORAL MEDICINE  Alcohol Use Disorder Identification Test Final Score (AUDIT) 0   GAD-7    Flowsheet Row Video Visit from 10/13/2022 in [facility] at [facility] Office Visit from 06/30/2022 in [facility] Office Visit from 05/26/2022 in [facility] Primary Care at [facility] Office Visit from 01/16/2017 in [facility] at [facility]  Total GAD-7 Score 0 11 11 3    PHQ2-9    Flowsheet Row Clinical Support from 07/12/2023 in [facility] Clinical Support from 02/10/2023  in [facility] at [facility] Office Visit from 01/19/2023 in [facility] Primary Care at [facility] Video Visit from 10/13/2022 in [facility] Primary Care at [facility] Telephone from 08/19/2022 in [facility] Community Care Coordination  PHQ-2 Total Score 0 0 0 0 2  PHQ-9 Total Score -- -- -- 0 --   Flowsheet Row ED to Hosp-Admission (Discharged) from 07/28/2023 in [facility]LO[facility] UROLOGY Admission (Discharged) from 11/04/2022 in [facility] Admission (Discharged) from 10/18/2022 in [facility]  C-SSRS RISK CATEGORY No Risk No Risk No Risk    Collaboration of Care: Collaboration of Care: Medication Management AEB medication prescription, Primary Care Provider AEB chart review, and Other provider involved in patient's care AEB Endocrinology chart review  Patient/Guardian was advised Release of Information must be obtained prior to any record release in order to collaborate their care with an outside provider. Patient/Guardian was advised if they have not already done so to contact the registration department to sign all necessary forms in order for us  to release information regarding their care.   Consent: Patient/Guardian gives verbal consent for treatment and assignment of benefits for services provided during this visit. Patient/Guardian expressed understanding and agreed to proceed.    Arvella CHRISTELLA Finder, MD 12/21/2023, 4:22 PM   Virtual Visit via Video Note  I connected with Victoria [name] on 12/21/23 at  2:30 PM EDT by a video enabled telemedicine application and verified that I am speaking with the correct person using two identifiers.  Location: Patient: Home Provider: Home Office   I discussed the limitations of evaluation and management by telemedicine and the availability of in person appointments. The patient expressed understanding and agreed to proceed.   I discussed the  assessment and treatment plan with the patient. The patient was provided an opportunity to ask questions and all were answered. The patient agreed with the plan and demonstrated an understanding of the instructions.   The patient was advised to call back or seek an in-person evaluation if the symptoms worsen or if the condition fails to improve as anticipated.  I provided 20 minutes of non-face-to-face time during  this encounter.   Arvella CHRISTELLA Finder, MD

## 2023-12-21 ENCOUNTER — Telehealth (HOSPITAL_COMMUNITY): Admitting: Psychiatry

## 2023-12-21 ENCOUNTER — Encounter (HOSPITAL_COMMUNITY): Payer: Self-pay | Admitting: Psychiatry

## 2023-12-21 DIAGNOSIS — M4120 Other idiopathic scoliosis, site unspecified: Secondary | ICD-10-CM | POA: Diagnosis not present

## 2023-12-21 DIAGNOSIS — M542 Cervicalgia: Secondary | ICD-10-CM | POA: Diagnosis not present

## 2023-12-21 DIAGNOSIS — F32A Depression, unspecified: Secondary | ICD-10-CM | POA: Diagnosis not present

## 2023-12-21 DIAGNOSIS — F419 Anxiety disorder, unspecified: Secondary | ICD-10-CM | POA: Diagnosis not present

## 2023-12-21 DIAGNOSIS — M545 Low back pain, unspecified: Secondary | ICD-10-CM | POA: Diagnosis not present

## 2023-12-21 MED ORDER — DOXEPIN HCL 25 MG PO CAPS
25.0000 mg | ORAL_CAPSULE | Freq: Every day | ORAL | 2 refills | Status: DC
Start: 2023-12-21 — End: 2024-03-19

## 2023-12-21 MED ORDER — CLONAZEPAM 0.5 MG PO TABS: 0.5000 mg | ORAL_TABLET | Freq: Every morning | ORAL | 1 refills | Status: DC

## 2023-12-21 MED ORDER — VENLAFAXINE HCL ER 75 MG PO CP24: 225.0000 mg | ORAL_CAPSULE | Freq: Every day | ORAL | 2 refills | Status: DC

## 2023-12-21 MED ORDER — RISPERIDONE 0.5 MG PO TABS: 0.5000 mg | ORAL_TABLET | Freq: Two times a day (BID) | ORAL | 1 refills | Status: DC

## 2023-12-21 MED ORDER — PINDOLOL 5 MG PO TABS: 5.0000 mg | ORAL_TABLET | Freq: Every day | ORAL | 2 refills | Status: DC

## 2023-12-26 ENCOUNTER — Other Ambulatory Visit: Payer: Self-pay | Admitting: Internal Medicine

## 2023-12-26 DIAGNOSIS — M4120 Other idiopathic scoliosis, site unspecified: Secondary | ICD-10-CM | POA: Diagnosis not present

## 2023-12-26 DIAGNOSIS — M542 Cervicalgia: Secondary | ICD-10-CM | POA: Diagnosis not present

## 2023-12-26 DIAGNOSIS — M545 Low back pain, unspecified: Secondary | ICD-10-CM | POA: Diagnosis not present

## 2023-12-31 ENCOUNTER — Other Ambulatory Visit: Payer: Self-pay | Admitting: Nurse Practitioner

## 2023-12-31 DIAGNOSIS — C50512 Malignant neoplasm of lower-outer quadrant of left female breast: Secondary | ICD-10-CM

## 2023-12-31 NOTE — Assessment & Plan Note (Signed)
 invasive ductal carcinoma, stage Ia, pT1aN0M0, ER+/PR+/HER2-, G1 --We discussed her imaging findings and the biopsy results in great details. -Giving the very early disease, she likely a candidate for lumpectomy. She is agreeable with that. She was seen by Dr. Curvin today and likely will proceed with surgery soon.  Given her age and small primary tumor, it is reasonable to forego sentinel lymph node biopsy. -Given the small tumor, no Oncotype or adjuvant chemotherapy needed.  Given the favorable prognostic factors, this is likely low risk disease. -she underwent left breast lumpectomy on Nov 04, 2022, I reviewed her surgical path. -She completed adjuvant radiation on January 23, 2023. -she started adjuvant tamoxifen  in 01/2023. - Tamoxifen  held starting February 2025 due to development of PE. -Bilateral breast MRI on 10/11/2023 was benign.  Repeat in April 2026. -Diagnostic mammogram expected in October 2025.

## 2023-12-31 NOTE — Progress Notes (Unsigned)
 Patient Care Team: Domenica Harlene LABOR, MD as PCP - General (Family Medicine) Lavona Agent, MD as PCP - Cardiology (Cardiology) Haverstock, Tawni CROME, MD as Referring Physician (Dermatology) Geronimo Amel, MD as Consulting Physician (Pulmonary Disease) Maranda Leim DEL, MD as Consulting Physician (Cardiology) Mat Browning, MD as Consulting Physician (Obstetrics and Gynecology) Roark Rush, MD as Consulting Physician (Otolaryngology) Pyrtle, Gordy HERO, MD as Consulting Physician (Gastroenterology) Tyree Nanetta SAILOR, RN as Oncology Nurse Navigator Glean, Stephane BROCKS, RN (Inactive) as Oncology Nurse Navigator Curvin Deward MOULD, MD as Consulting Physician (General Surgery) Lanny Callander, MD as Consulting Physician (Hematology) Shannon Agent, MD as Consulting Physician (Radiation Oncology)  Clinic Day:  01/02/2024  Referring physician: Domenica Harlene LABOR, MD  ASSESSMENT & PLAN:   Assessment & Plan: Primary malignant neoplasm of lower-outer quadrant of breast, left (HCC) invasive ductal carcinoma, stage Ia, pT1aN0M0, ER+/PR+/HER2-, G1 --We discussed her imaging findings and the biopsy results in great details. -Giving the very early disease, she likely a candidate for lumpectomy. She is agreeable with that. She was seen by Dr. Curvin today and likely will proceed with surgery soon.  Given her age and small primary tumor, it is reasonable to forego sentinel lymph node biopsy. -Given the small tumor, no Oncotype or adjuvant chemotherapy needed.  Given the favorable prognostic factors, this is likely low risk disease. -she underwent left breast lumpectomy on Nov 04, 2022, I reviewed her surgical path. -She completed adjuvant radiation on January 23, 2023. -she started adjuvant tamoxifen  in 01/2023. - Tamoxifen  held starting February 2025 due to development of PE. -Bilateral breast MRI on 10/11/2023 was benign.  Repeat in April 2026. -Diagnostic mammogram expected in October 2025.  This was ordered as part  of today's visit. Continue breast cancer surveillance. Labs and follow-up expected in 6 months, sooner if needed.   Pulmonary embolism Tamoxifen  discontinued patient developed PE in February 2025.  She is currently taking Eliquis  5 mg twice daily.  Pulmonologist recommends she take Eliquis , uninterrupted, for about 1 year.  Patient is reluctant to start aromatase inhibitor.  She is already dealing with high cholesterol, osteoporosis, and asthma.  She is concerned that side effects associated with aromatase inhibitors may outweigh any possible benefit.  She would rather continue with close screening for breast cancer.  She will be due for diagnostic mammogram in October 2025.  Will order this as part of today's visit.  Iron  deficiency anemia Labs today show mild anemia with Hgb 11.5 and HCT 35.4.  Ferritin is 13.  Recommend oral iron  supplement in the form of prenatal vitamin.  Will recheck in 6 months and administer IV iron  as indicated.  Plan  Labs reviewed. - CBC showing mild and stable anemia with Hgb 11.5 and HCT 35.4.  Ferritin just barely above normal at 13.  Recommend oral iron  supplement and recheck in 6 months.  Plan for IV iron  as indicated. -CMP unremarkable. Reviewed results of bilateral breast MRI from April 2025 which was benign. Patient due for diagnostic mammogram in October 2025.  Will order as part of today's visit. Continue with breast cancer surveillance. Labs and follow-up in 6 months, sooner if needed.  The patient understands the plans discussed today and is in agreement with them.  She knows to contact our office if she develops concerns prior to her next appointment.  I provided 25 minutes of face-to-face time during this encounter and > 50% was spent counseling as documented under my assessment and plan.    [name], NP  [facility] - A DEPT OF [facility] [address] [place]  [address] Dept: [phone] Dept Fax: [phone]   Orders Placed This Encounter  Procedures   MM 3D DIAGNOSTIC MAMMOGRAM BILATERAL BREAST    Standing Status:   Future    Expected Date:   04/03/2024    Expiration Date:   01/01/2025    Reason for Exam (SYMPTOM  OR DIAGNOSIS REQUIRED):   breast cancer follow up    Preferred imaging location?:   [facility]   CBC with Differential (Cancer Center Only)    Standing Status:   Standing    Number of Occurrences:   5    Expiration Date:   01/01/2025   Iron  and Iron  Binding Capacity ([facility] only)    Standing Status:   Standing    Number of Occurrences:   5    Expiration Date:   01/01/2025   Ferritin    Standing Status:   Standing    Number of Occurrences:   5    Expiration Date:   01/01/2025   CMP (Cancer Center only)    Standing Status:   Standing    Number of Occurrences:   5    Expiration Date:   01/01/2025      CHIEF COMPLAINT:  CC: Left breast cancer, estrogen receptor positive  Current Treatment: Tamoxifen  D/C due to PE  INTERVAL HISTORY:  Victoria [name] is here today for repeat clinical assessment.  She last saw Dr. Lanny on 08/07/2023.  Tamoxifen  was discontinued due to development of PE.  Consideration of anastrozole or letrozole if she desires. Bilateral breast MRI done 10/11/2023.  Surgical changes associated with left breast lumpectomy are noted.  No MRI evidence of breast malignancy in either breast.  She should be scheduled for bilateral diagnostic mammogram in 6 months (October 2025).  She reports feeling well in general.  She denies chest pain, chest pressure, or shortness of breath. She denies headaches or visual disturbances. She denies abdominal pain, nausea, vomiting, or changes in bowel or bladder habits.  She denies changes in either breast.  She has noticed no new masses or lumps.  She denies fevers or chills. She denies pain. Her appetite is good. Her weight has been stable.  I have reviewed the past medical history, past  surgical history, social history and family history with the patient and they are unchanged from previous note.  ALLERGIES:  is allergic to latuda  [lurasidone  hcl], beclomethasone dipropionate, flexeril  [cyclobenzaprine ], mometasone  furo-formoterol fum, sulfonamide derivatives, statins, and fluticasone .  MEDICATIONS:  Current Outpatient Medications  Medication Sig Dispense Refill   acetaminophen  (TYLENOL ) 650 MG CR tablet Take 650-1,300 mg by mouth every 8 (eight) hours as needed for pain.     albuterol  (VENTOLIN  HFA) 108 (90 Base) MCG/ACT inhaler Inhale 1-2 puffs into the lungs every 6 (six) hours as needed. 8 g 2   apixaban  (ELIQUIS ) 5 MG TABS tablet Take 1 tablet (5 mg total) by mouth 2 (two) times daily. 60 tablet 4   ascorbic acid (VITAMIN C) 500 MG tablet Take 500 mg by mouth every evening.     benralizumab  (FASENRA  PEN) 30 MG/ML prefilled autoinjector Inject 1 mL (30 mg total) into the skin every 8 (eight) weeks. ** AZ&ME PATIENT ASSISTANCE** 1 mL 2   Calcium -Magnesium -Vitamin D  (CALCIUM  MAGNESIUM  PO) Take 1 tablet by mouth at bedtime.     [START ON 01/08/2024] clonazePAM  (KLONOPIN ) 0.5 MG tablet Take  1 tablet (0.5 mg total) by mouth in the morning. 30 tablet 1   denosumab  (PROLIA ) 60 MG/ML SOSY injection Inject 60 mg into the skin every 6 (six) months.     doxepin  (SINEQUAN ) 25 MG capsule Take 1 capsule (25 mg total) by mouth at bedtime. 30 capsule 2   Evolocumab  (REPATHA  SURECLICK) 140 MG/ML SOAJ Inject 140 mg into the skin every 14 (fourteen) days. (Patient taking differently: Inject 140 mg into the skin every 21 ( twenty-one) days.) 2 mL 11   famotidine  (PEPCID ) 20 MG tablet TAKE 1 TABLET BY MOUTH TWICE A DAY 60 tablet 1   ipratropium-albuterol  (DUONEB) 0.5-2.5 (3) MG/3ML SOLN Take 3 mLs by nebulization every 6 (six) hours as needed. (Patient taking differently: Take 3 mLs by nebulization every 6 (six) hours as needed (for shortness of breazth or wheezing).) 120 mL 5   nitroGLYCERIN   (NITROSTAT ) 0.4 MG SL tablet Place 1 tablet (0.4 mg total) under the tongue every 5 (five) minutes as needed for chest pain. 25 tablet 11   pindolol  (VISKEN ) 5 MG tablet Take 1 tablet (5 mg total) by mouth daily after breakfast. 30 tablet 2   risperiDONE  (RISPERDAL ) 0.5 MG tablet Take 1 tablet (0.5 mg total) by mouth 2 (two) times daily. Will start the 0.5 mg BID dosing in 2 weeks 60 tablet 1   Semaglutide ,0.25 or 0.5MG /DOS, 2 MG/1.5ML SOPN Inject 0.25 mg into the skin every Friday.     venlafaxine  XR (EFFEXOR -XR) 75 MG 24 hr capsule Take 3 capsules (225 mg total) by mouth daily with breakfast. 90 capsule 2   Current Facility-Administered Medications  Medication Dose Route Frequency Provider Last Rate Last Admin   [START ON 06/09/2024] denosumab  (PROLIA ) injection 60 mg  60 mg Subcutaneous Q6 months Domenica Harlene LABOR, MD        HISTORY OF PRESENT ILLNESS:   Oncology History Overview Note   Cancer Staging  Primary malignant neoplasm of lower-outer quadrant of breast, left Staging form: Breast, AJCC 8th Edition - Clinical stage from 10/10/2022: cT1a, cN0, cM0, G1 - Unsigned Stage prefix: Initial diagnosis Method of lymph node assessment: Clinical Histologic grading system: 3 grade system     Primary malignant neoplasm of lower-outer quadrant of breast, left (HCC)  10/10/2022 Initial Diagnosis   Primary malignant neoplasm of lower-outer quadrant of breast, left   11/04/2022 Cancer Staging   Staging form: Breast, AJCC 8th Edition - Pathologic stage from 11/04/2022: Stage Unknown (pT1a, pNX, G1, ER+, PR+, HER2-) - Signed by Lanny Callander, MD on 01/12/2023 Histologic grading system: 3 grade system Residual tumor (R): R0 - None   11/04/2022 Surgery   LEFT BREAST LUMPECTOMY WITH RADIOACTIVE SEED LOCALIZATION   Surgeon:Toth, Deward MOULD, MD    11/04/2022 Pathology Results    FINAL MICROSCOPIC DIAGNOSIS:  A. BREAST, LEFT, LUMPECTOMY:      Invasive ductal carcinoma, 0.3 cm, grade 1 Ductal carcinoma  in situ:  Not identified Margins, invasive:  Negative           Closest, invasive:  Posterior margin Margins, DCIS:  NA           Closest, DCIS:  NA Lymphovascular invasion:  Not identified Prognostic markers: ER: 100%, Positive, strong staining intensity PR: 100%, Positive, strong staining intensity Her2: Negative, 0 Ki-67: 5% Other:  Sclerosing adenosis, microcysts, dilated mammary ducts      Biopsy sites and clips identified See oncology table  B. BREAST, LEFT LATERAL MARGIN, EXCISION:      Benign breast tissue  with sclerosing adenosis and microcysts.      Negative for malignancy.  C. BREAST, LEFT ANTERIOR MARGIN, EXCISION:      Benign breast tissue with sclerosing adenosis, microcysts, apocrine metaplasia, dilated mammary ducts.      Negative for malignancy.    10/11/2023 Imaging   Bilateral breast MRI IMPRESSION: 1. No MR evidence of breast malignancy. 2. LEFT lumpectomy changes.  RECOMMENDATION: Bilateral diagnostic mammogram in 6 months to resume annual mammogram schedule.   Bilateral screening breast MRI in 1 year in this high risk patient.   BI-RADS CATEGORY  2: Benign.       REVIEW OF SYSTEMS:   Constitutional: Denies fevers, chills or abnormal weight loss. Fatigue.  Eyes: Denies blurriness of vision Ears, nose, mouth, throat, and face: Denies mucositis or sore throat Respiratory: Denies cough, dyspnea or wheezes Cardiovascular: Denies palpitation, chest discomfort or lower extremity swelling Gastrointestinal:  Denies nausea, heartburn or change in bowel habits Skin: Denies abnormal skin rashes Lymphatics: Denies new lymphadenopathy or easy bruising Neurological:Denies numbness, tingling or new weaknesses Behavioral/Psych: Mood is stable, no new changes  All other systems were reviewed with the patient and are negative.   VITALS:   Today's Vitals   01/01/24 1152 01/01/24 1217  BP: 120/72   Pulse: 76   Resp: 17   Temp: 97.6 F (36.4 C)   SpO2: 97%    Weight: 165 lb 3.2 oz (74.9 kg)   PainSc:  0-No pain   Body mass index is 30.22 kg/m.   Wt Readings from Last 3 Encounters:  01/01/24 165 lb 3.2 oz (74.9 kg)  10/30/23 166 lb (75.3 kg)  09/11/23 167 lb 3.2 oz (75.8 kg)    Body mass index is 30.22 kg/m.  Performance status (ECOG): 1 - Symptomatic but completely ambulatory  PHYSICAL EXAM:   GENERAL:alert, no distress and comfortable SKIN: skin color, texture, turgor are normal, no rashes or significant lesions EYES: normal, Conjunctiva are pink and non-injected, sclera clear OROPHARYNX:no exudate, no erythema and lips, buccal mucosa, and tongue normal  NECK: supple, thyroid  normal size, non-tender, without nodularity LYMPH:  no palpable lymphadenopathy in the cervical, axillary or inguinal LUNGS: clear to auscultation and percussion with normal breathing effort HEART: regular rate & rhythm and no murmurs and no lower extremity edema ABDOMEN:abdomen soft, non-tender and normal bowel sounds Musculoskeletal:no cyanosis of digits and no clubbing  NEURO: alert & oriented x 3 with fluent speech, no focal motor/sensory deficits BREAST: There are no palpable masses or lumps in the left breast.  There is well-healed lumpectomy scar.  There is no nipple inversion or nipple discharge.  There is no axillary lymphadenopathy on the left.  There are no palpable masses or lumps in the right breast.  There is no nipple inversion or nipple discharge.  There is no axillary lymphadenopathy on the right.  LABORATORY DATA:  I have reviewed the data as listed    Component Value Date/Time   NA 135 01/01/2024 1122   NA 135 09/23/2021 1321   K 4.3 01/01/2024 1122   CL 102 01/01/2024 1122   CO2 29 01/01/2024 1122   GLUCOSE 88 01/01/2024 1122   BUN 15 01/01/2024 1122   BUN 21 09/23/2021 1321   CREATININE 0.68 01/01/2024 1122   CREATININE 0.59 08/17/2023 1137   CALCIUM  9.1 01/01/2024 1122   PROT 6.5 01/01/2024 1122   PROT 6.3 09/23/2021 1321    ALBUMIN  4.0 01/01/2024 1122   ALBUMIN  4.5 09/23/2021 1321   AST 14 (L)  01/01/2024 1122   ALT 12 01/01/2024 1122   ALKPHOS 82 01/01/2024 1122   BILITOT 0.3 01/01/2024 1122   GFRNONAA >60 01/01/2024 1122   GFRAA >60 08/24/2017 0349    Lab Results  Component Value Date   WBC 4.3 01/01/2024   NEUTROABS 3.1 01/01/2024   HGB 11.5 (L) 01/01/2024   HCT 35.4 (L) 01/01/2024   MCV 85.7 01/01/2024   PLT 288 01/01/2024

## 2024-01-01 ENCOUNTER — Inpatient Hospital Stay: Payer: PPO | Attending: Hematology

## 2024-01-01 ENCOUNTER — Inpatient Hospital Stay (HOSPITAL_BASED_OUTPATIENT_CLINIC_OR_DEPARTMENT_OTHER): Payer: PPO | Admitting: Nurse Practitioner

## 2024-01-01 VITALS — BP 120/72 | HR 76 | Temp 97.6°F | Resp 17 | Wt 165.2 lb

## 2024-01-01 DIAGNOSIS — Z86711 Personal history of pulmonary embolism: Secondary | ICD-10-CM | POA: Insufficient documentation

## 2024-01-01 DIAGNOSIS — Z923 Personal history of irradiation: Secondary | ICD-10-CM | POA: Insufficient documentation

## 2024-01-01 DIAGNOSIS — C50512 Malignant neoplasm of lower-outer quadrant of left female breast: Secondary | ICD-10-CM | POA: Diagnosis not present

## 2024-01-01 DIAGNOSIS — Z170 Estrogen receptor positive status [ER+]: Secondary | ICD-10-CM | POA: Diagnosis not present

## 2024-01-01 DIAGNOSIS — D509 Iron deficiency anemia, unspecified: Secondary | ICD-10-CM | POA: Diagnosis not present

## 2024-01-01 DIAGNOSIS — Z7901 Long term (current) use of anticoagulants: Secondary | ICD-10-CM | POA: Insufficient documentation

## 2024-01-01 DIAGNOSIS — Z79899 Other long term (current) drug therapy: Secondary | ICD-10-CM | POA: Insufficient documentation

## 2024-01-01 DIAGNOSIS — Z1721 Progesterone receptor positive status: Secondary | ICD-10-CM | POA: Diagnosis not present

## 2024-01-01 DIAGNOSIS — Z1732 Human epidermal growth factor receptor 2 negative status: Secondary | ICD-10-CM | POA: Insufficient documentation

## 2024-01-01 DIAGNOSIS — D649 Anemia, unspecified: Secondary | ICD-10-CM

## 2024-01-01 LAB — CMP (CANCER CENTER ONLY)
ALT: 12 U/L (ref 0–44)
AST: 14 U/L — ABNORMAL LOW (ref 15–41)
Albumin: 4 g/dL (ref 3.5–5.0)
Alkaline Phosphatase: 82 U/L (ref 38–126)
Anion gap: 4 — ABNORMAL LOW (ref 5–15)
BUN: 15 mg/dL (ref 8–23)
CO2: 29 mmol/L (ref 22–32)
Calcium: 9.1 mg/dL (ref 8.9–10.3)
Chloride: 102 mmol/L (ref 98–111)
Creatinine: 0.68 mg/dL (ref 0.44–1.00)
GFR, Estimated: >60 mL/min (ref >60)
Glucose, Bld: 88 mg/dL (ref 70–99)
Potassium: 4.3 mmol/L (ref 3.5–5.1)
Sodium: 135 mmol/L (ref 135–145)
Total Bilirubin: 0.3 mg/dL (ref 0.0–1.2)
Total Protein: 6.5 g/dL (ref 6.5–8.1)

## 2024-01-01 LAB — CBC WITH DIFFERENTIAL (CANCER CENTER ONLY)
Abs Immature Granulocytes: 0.01 K/uL (ref 0.00–0.07)
Basophils Absolute: 0 K/uL (ref 0.0–0.1)
Basophils Relative: 0 %
Eosinophils Absolute: 0 K/uL (ref 0.0–0.5)
Eosinophils Relative: 0 %
HCT: 35.4 % — ABNORMAL LOW (ref 36.0–46.0)
Hemoglobin: 11.5 g/dL — ABNORMAL LOW (ref 12.0–15.0)
Immature Granulocytes: 0 %
Lymphocytes Relative: 18 %
Lymphs Abs: 0.8 K/uL (ref 0.7–4.0)
MCH: 27.8 pg (ref 26.0–34.0)
MCHC: 32.5 g/dL (ref 30.0–36.0)
MCV: 85.7 fL (ref 80.0–100.0)
Monocytes Absolute: 0.4 K/uL (ref 0.1–1.0)
Monocytes Relative: 9 %
Neutro Abs: 3.1 K/uL (ref 1.7–7.7)
Neutrophils Relative %: 73 %
Platelet Count: 288 K/uL (ref 150–400)
RBC: 4.13 MIL/uL (ref 3.87–5.11)
RDW: 14.8 % (ref 11.5–15.5)
WBC Count: 4.3 K/uL (ref 4.0–10.5)
nRBC: 0 % (ref 0.0–0.2)

## 2024-01-01 LAB — FERRITIN: Ferritin: 13 ng/mL (ref 11–307)

## 2024-01-02 ENCOUNTER — Encounter: Payer: Self-pay | Admitting: Nurse Practitioner

## 2024-01-04 DIAGNOSIS — M542 Cervicalgia: Secondary | ICD-10-CM | POA: Diagnosis not present

## 2024-01-04 DIAGNOSIS — M4120 Other idiopathic scoliosis, site unspecified: Secondary | ICD-10-CM | POA: Diagnosis not present

## 2024-01-04 DIAGNOSIS — M545 Low back pain, unspecified: Secondary | ICD-10-CM | POA: Diagnosis not present

## 2024-01-08 ENCOUNTER — Other Ambulatory Visit: Payer: Self-pay | Admitting: Hematology

## 2024-01-08 ENCOUNTER — Encounter (HOSPITAL_COMMUNITY): Payer: Self-pay

## 2024-01-09 ENCOUNTER — Encounter: Payer: Self-pay | Admitting: Internal Medicine

## 2024-01-10 NOTE — Assessment & Plan Note (Signed)
 Does not tolerate statins.

## 2024-01-10 NOTE — Assessment & Plan Note (Signed)
 Hydrate and monitor

## 2024-01-10 NOTE — Assessment & Plan Note (Signed)
 hgba1c acceptable, minimize simple carbs. Increase exercise as tolerated.

## 2024-01-10 NOTE — Assessment & Plan Note (Signed)
 Asymptomatic, continue to monitor and manage medically

## 2024-01-10 NOTE — Assessment & Plan Note (Signed)
 Encourage heart healthy diet such as MIND or DASH diet, increase exercise, avoid trans fats, simple carbohydrates and processed foods, consider a krill or fish or flaxseed oil cap daily.

## 2024-01-10 NOTE — Assessment & Plan Note (Signed)
Avoid offending foods, start probiotics. Do not eat large meals in late evening and consider raising head of bed.  

## 2024-01-10 NOTE — Assessment & Plan Note (Signed)
 Patient encouraged to maintain heart healthy diet, regular exercise, adequate sleep. Consider daily probiotics. Take medications as prescribed. Labs ordered and reviewed. MM 03/2023 repeat in 1 year. Pap negative August 2021 repeat in 2026. Colonoscopy June 2021 repeat in 10 years.

## 2024-01-11 ENCOUNTER — Encounter: Payer: Self-pay | Admitting: Family Medicine

## 2024-01-11 ENCOUNTER — Ambulatory Visit (INDEPENDENT_AMBULATORY_CARE_PROVIDER_SITE_OTHER): Payer: PPO | Admitting: Family Medicine

## 2024-01-11 VITALS — BP 118/66 | HR 77 | Resp 16 | Ht 62.0 in | Wt 164.4 lb

## 2024-01-11 DIAGNOSIS — R234 Changes in skin texture: Secondary | ICD-10-CM

## 2024-01-11 DIAGNOSIS — Z0000 Encounter for general adult medical examination without abnormal findings: Secondary | ICD-10-CM

## 2024-01-11 DIAGNOSIS — R739 Hyperglycemia, unspecified: Secondary | ICD-10-CM

## 2024-01-11 DIAGNOSIS — K219 Gastro-esophageal reflux disease without esophagitis: Secondary | ICD-10-CM

## 2024-01-11 DIAGNOSIS — E782 Mixed hyperlipidemia: Secondary | ICD-10-CM | POA: Diagnosis not present

## 2024-01-11 DIAGNOSIS — K449 Diaphragmatic hernia without obstruction or gangrene: Secondary | ICD-10-CM

## 2024-01-11 DIAGNOSIS — T466X5A Adverse effect of antihyperlipidemic and antiarteriosclerotic drugs, initial encounter: Secondary | ICD-10-CM | POA: Diagnosis not present

## 2024-01-11 DIAGNOSIS — I5032 Chronic diastolic (congestive) heart failure: Secondary | ICD-10-CM | POA: Diagnosis not present

## 2024-01-11 DIAGNOSIS — I998 Other disorder of circulatory system: Secondary | ICD-10-CM

## 2024-01-11 DIAGNOSIS — M62838 Other muscle spasm: Secondary | ICD-10-CM

## 2024-01-11 DIAGNOSIS — G720 Drug-induced myopathy: Secondary | ICD-10-CM

## 2024-01-11 MED ORDER — METRONIDAZOLE 250 MG PO TABS: 250.0000 mg | ORAL_TABLET | Freq: Three times a day (TID) | ORAL | 0 refills | Status: DC

## 2024-01-11 MED ORDER — CIPROFLOXACIN HCL 500 MG PO TABS: 500.0000 mg | ORAL_TABLET | Freq: Two times a day (BID) | ORAL | 0 refills | Status: DC

## 2024-01-11 NOTE — Patient Instructions (Signed)
 RSV, Respiratory Syncitial Virus Vaccine, Arexvy vaccine at pharmacy Tetanus in 02/2025 or sooner if injured  Preventive Care 65 Years and Older, Female Preventive care refers to lifestyle choices and visits with your health care provider that can promote health and wellness. Preventive care visits are also called wellness exams. What can I expect for my preventive care visit? Counseling Your health care provider may ask you questions about your: Medical history, including: Past medical problems. Family medical history. Pregnancy and menstrual history. History of falls. Current health, including: Memory and ability to understand (cognition). Emotional well-being. Home life and relationship well-being. Sexual activity and sexual health. Lifestyle, including: Alcohol, nicotine or tobacco, and drug use. Access to firearms. Diet, exercise, and sleep habits. Work and work Astronomer. Sunscreen use. Safety issues such as seatbelt and bike helmet use. Physical exam Your health care provider will check your: Height and weight. These may be used to calculate your BMI (body mass index). BMI is a measurement that tells if you are at a healthy weight. Waist circumference. This measures the distance around your waistline. This measurement also tells if you are at a healthy weight and may help predict your risk of certain diseases, such as type 2 diabetes and high blood pressure. Heart rate and blood pressure. Body temperature. Skin for abnormal spots. What immunizations do I need?  Vaccines are usually given at various ages, according to a schedule. Your health care provider will recommend vaccines for you based on your age, medical history, and lifestyle or other factors, such as travel or where you work. What tests do I need? Screening Your health care provider may recommend screening tests for certain conditions. This may include: Lipid and cholesterol levels. Hepatitis C  test. Hepatitis B test. HIV (human immunodeficiency virus) test. STI (sexually transmitted infection) testing, if you are at risk. Lung cancer screening. Colorectal cancer screening. Diabetes screening. This is done by checking your blood sugar (glucose) after you have not eaten for a while (fasting). Mammogram. Talk with your health care provider about how often you should have regular mammograms. BRCA-related cancer screening. This may be done if you have a family history of breast, ovarian, tubal, or peritoneal cancers. Bone density scan. This is done to screen for osteoporosis. Talk with your health care provider about your test results, treatment options, and if necessary, the need for more tests. Follow these instructions at home: Eating and drinking  Eat a diet that includes fresh fruits and vegetables, whole grains, lean protein, and low-fat dairy products. Limit your intake of foods with high amounts of sugar, saturated fats, and salt. Take vitamin and mineral supplements as recommended by your health care provider. Do not drink alcohol if your health care provider tells you not to drink. If you drink alcohol: Limit how much you have to 0-1 drink a day. Know how much alcohol is in your drink. In the [place] one drink equals one 12 oz bottle of beer (355 mL), one 5 oz glass of wine (148 mL), or one 1 oz glass of hard liquor (44 mL). Lifestyle Brush your teeth every morning and night with fluoride toothpaste. Floss one time each day. Exercise for at least 30 minutes 5 or more days each week. Do not use any products that contain nicotine or tobacco. These products include cigarettes, chewing tobacco, and vaping devices, such as e-cigarettes. If you need help quitting, ask your health care provider. Do not use drugs. If you are sexually active, practice safe sex. Use a  condom or other form of protection in order to prevent STIs. Take aspirin  only as told by your health care provider.  Make sure that you understand how much to take and what form to take. Work with your health care provider to find out whether it is safe and beneficial for you to take aspirin  daily. Ask your health care provider if you need to take a cholesterol-lowering medicine (statin). Find healthy ways to manage stress, such as: Meditation, yoga, or listening to music. Journaling. Talking to a trusted person. Spending time with friends and family. Minimize exposure to UV radiation to reduce your risk of skin cancer. Safety Always wear your seat belt while driving or riding in a vehicle. Do not drive: If you have been drinking alcohol. Do not ride with someone who has been drinking. When you are tired or distracted. While texting. If you have been using any mind-altering substances or drugs. Wear a helmet and other protective equipment during sports activities. If you have firearms in your house, make sure you follow all gun safety procedures. What's next? Visit your health care provider once a year for an annual wellness visit. Ask your health care provider how often you should have your eyes and teeth checked. Stay up to date on all vaccines. This information is not intended to replace advice given to you by your health care provider. Make sure you discuss any questions you have with your health care provider. Document Revised: 12/02/2020 Document Reviewed: 12/02/2020 Elsevier Patient Education  2024 [employer].

## 2024-01-14 ENCOUNTER — Other Ambulatory Visit: Payer: Self-pay | Admitting: Family Medicine

## 2024-01-14 ENCOUNTER — Encounter: Payer: Self-pay | Admitting: Family Medicine

## 2024-01-14 MED ORDER — DOXYCYCLINE HYCLATE 100 MG PO TABS: 100.0000 mg | ORAL_TABLET | Freq: Two times a day (BID) | ORAL | 0 refills | Status: DC

## 2024-01-14 NOTE — Progress Notes (Signed)
 Subjective:    Patient ID: Victoria [name], female    DOB: Jul 06, 1955, 68 y.o.   MRN: [id number]  Chief Complaint  Patient presents with   Annual Exam    Patient presents today for a physical exam.   Quality Metric Gaps    AWV, mammogram   Acute Visit    Low left quadrant abdominal pain think it maybe from her diverticulitis.    HPI Discussed the use of AI scribe software for clinical note transcription with the patient, who gave verbal consent to proceed.  History of Present Illness Victoria [name] is a 68 year old female with diverticulitis who presents with abdominal pain.  She experienced a sudden onset of abdominal pain starting last night, describing it as 'super tender' today, more so than last night. No recent changes in bowel habits, constipation, or fever. She moved her bowels this morning but describes them as 'two very small ones' and finds it painful to push due to the tenderness.  She has a history of diverticulitis and has previously been treated with ciprofloxacin  and metronidazole , which she recalls as 'Ciproflagyl'. She has been prescribed this regimen before, most recently in 2022, and has also used amoxiclavulonic acid (Augmentin ) in the past.  She mentions a skin condition on her right leg, above the ankle, which is purple, sunken, and hard around the edges. It is not tender or hot to touch. She has no history of trauma to the area.  She has a history of breast cancer and was on tamoxifen  until February when she had pulmonary embolisms. She is currently not on any medication for breast cancer and is being monitored with screenings every six months. She is also being treated for osteoporosis and high cholesterol.  She reports chewing her tongue frequently, which she attributes to anxiety or habit, and has recently decreased her risperidone  dosage without noticing any mood changes.  Her recent blood work includes a metabolic panel, cholesterol, vitamin D , B12, anemia  check, A1c, and thyroid  function, all of which were within normal limits. She had a colonoscopy in 2021 and a negative Pap smear the same year. She underwent a hysterectomy in 2024 due to high risk and bladder prolapse, and her mammogram is scheduled for October.  She has been adjusting to changes in her living situation since her son moved back home in March. Denies CP/palp/SOB/HA/congestion/fevers/GI or GU c/o. Taking meds as prescribed     Past Medical History:  Diagnosis Date   Allergic bronchopulmonary aspergillosis (HCC) 2008   sees Dr Katheren Finn pulmonology   Anemia    iron  deficiency, resolved   Anxiety    Asthma    Breast cancer (HCC)    CAD (coronary artery disease)    a. LHC 6/16:  oOM1 60, pRCA 25 >> med Rx b. cath 3/19 2nd OM with 95% stenosis s/p synergy DES & anomalous RCA   CAP (community acquired pneumonia) 2016; 06/07/2016   Chronic bronchitis (HCC)    Chronic lower back pain    Complication of anesthesia    think I have a hard time waking up from it   Depression    mild   Diverticulitis    Diverticulosis    GERD (gastroesophageal reflux disease)    H/O hiatal hernia    Headache    weekly (08/23/2017)   History of echocardiogram    Echo 6/16:  Mod LVH, EF 60-65%, no RWMA, Gr 1 DD, trivial MR, normal LA size.   History  of radiation therapy    Left breast- 12/26/22-01/23/23-Dr. Lynwood Nasuti   Hyperglycemia 11/20/2015   Hyperlipidemia, mixed 09/11/2007   Qualifier: Diagnosis of  By: Antonio ROSALEA Rockers   Did not tolerate Lipitor, zocor, Lovastatin, Pravastatin, Livalo , Crestor  even low dose    IBS (irritable bowel syndrome)    Maxillary sinusitis    Normal cardiac stress test 11/2011   No evidence of ischemia or infarct.   Calculated ejection fraction 72%.   Obesity    OSA (obstructive sleep apnea) 02/2012   has stopped using  cpap   Osteoarthritis    Osteoporosis    Personal history of radiation therapy    Pneumonia 11/2011   before 2013 I  hadn't had pneumonia since I was a child (04/13/2012)   Pulmonary nodules    S/P angioplasty with stent 08/23/17 ostial 2nd OM with DES synnergy 08/24/2017   Schatzki's ring     Past Surgical History:  Procedure Laterality Date   ANTERIOR AND POSTERIOR REPAIR N/A 10/18/2022   Procedure: ANTERIOR (CYSTOCELE);  Surgeon: Mat Browning, MD;  Location: [facility] OR;  Service: Gynecology;  Laterality: N/A;   APPENDECTOMY  1989   BREAST BIOPSY  11/03/2022   MM LT RADIOACTIVE SEED LOC MAMMO GUIDE 11/03/2022 [facility] MAMMOGRAPHY   BREAST LUMPECTOMY WITH RADIOACTIVE SEED LOCALIZATION Left 11/04/2022   Procedure: LEFT BREAST LUMPECTOMY WITH RADIOACTIVE SEED LOCALIZATION;  Surgeon: Curvin Deward MOULD, MD;  Location: [facility];  Service: General;  Laterality: Left;   CARDIAC CATHETERIZATION N/A 11/25/2014   Procedure: Right/Left Heart Cath and Coronary Angiography;  Surgeon: Victory LELON Sharps, MD;  Location: [facility] INVASIVE CV LAB;  Service: Cardiovascular;  Laterality: N/A;   CESAREAN SECTION  1985   COLONOSCOPY  03/2022   CORONARY ANGIOPLASTY WITH STENT PLACEMENT  08/23/2017   CORONARY STENT INTERVENTION N/A 08/23/2017   Procedure: CORONARY STENT INTERVENTION;  Surgeon: Verlin Lonni BIRCH, MD;  Location: [facility] INVASIVE CV LAB;  Service: Cardiovascular;  Laterality: N/A;   HERNIA REPAIR  04/13/2012   VHR laparoscopic   LAPAROSCOPIC BILATERAL SALPINGO OOPHERECTOMY Bilateral 10/18/2022   Procedure: LAPAROTOMY WITH BILATERAL SALPINGO OOPHORECTOMY;  Surgeon: Mat Browning, MD;  Location: [facility] OR;  Service: Gynecology;  Laterality: Bilateral;   LEFT HEART CATH AND CORONARY ANGIOGRAPHY N/A 08/23/2017   Procedure: LEFT HEART CATH AND CORONARY ANGIOGRAPHY;  Surgeon: Verlin Lonni BIRCH, MD;  Location: [facility] INVASIVE CV LAB;  Service: Cardiovascular;  Laterality: N/A;   VAGINAL HYSTERECTOMY N/A 10/18/2022   Procedure: HYSTERECTOMY VAGINAL;  Surgeon: Mat Browning, MD;  Location: [facility] OR;  Service: Gynecology;   Laterality: N/A;   VENTRAL HERNIA REPAIR  04/13/2012   Procedure: LAPAROSCOPIC VENTRAL HERNIA;  Surgeon: Elon CHRISTELLA Pacini, MD;  Location: [facility] OR;  Service: General;  Laterality: N/A;  laparoscopic repair of incarcerated hernia    Family History  Problem Relation Age of Onset   Breast cancer Mother 71       bilateral breast cancer dx. 7, met to liver   Hypertension Mother    Diabetes Mother    Diverticulosis Father    Prostate cancer Father    Breast cancer Sister 26       DCIS at 52, IDC at 73, PALB2+   Cancer Sister        breast cancer, invasive ductal carcinoma in 2022,DCIS at 64 with 4 weeks of radiation, 5 years of Tamoxifen     Pulmonary embolism Brother        recurrent   Heart attack Maternal Grandfather  Ovarian cancer Paternal Grandmother    Cerebral palsy Son    Prostate cancer Paternal Uncle    Breast cancer Niece 7       PALB2+   Osteoporosis Niece    Stroke Neg Hx    Colon cancer Neg Hx    Esophageal cancer Neg Hx    Stomach cancer Neg Hx    Rectal cancer Neg Hx     Social History   Socioeconomic History   Marital status: Married    Spouse name: Not on file   Number of children: 1   Years of education: 14   Highest education level: Associate degree: academic program  Occupational History   Occupation: Disabled   Tobacco Use   Smoking status: Never    Passive exposure: Never   Smokeless tobacco: Never  Vaping Use   Vaping status: Never Used  Substance and Sexual Activity   Alcohol use: Yes    Alcohol/week: 4.0 - 7.0 standard drinks of alcohol    Types: 4 - 7 Standard drinks or equivalent per week   Drug use: No   Sexual activity: Yes    Birth control/protection: Surgical    Comment: gluten free, lives with husband and son with CP quadriplegia  Other Topics Concern   Not on file  Social History Narrative   Cares for son with cerebral palsy.    Lives at home with her husband and son.   Right-handed.   2 cups caffeine per day.   One story  home   Social Drivers of Health   Financial Resource Strain: Low Risk  (01/18/2023)   Overall Financial Resource Strain (CARDIA)    Difficulty of Paying Living Expenses: Not hard at all  Food Insecurity: No Food Insecurity (07/30/2023)   Hunger Vital Sign    Worried About Running Out of Food in the Last Year: Never true    Ran Out of Food in the Last Year: Never true  Transportation Needs: No Transportation Needs (07/30/2023)   PRAPARE - Administrator, Civil Service (Medical): No    Lack of Transportation (Non-Medical): No  Physical Activity: Inactive (02/10/2023)   Exercise Vital Sign    Days of Exercise per Week: 0 days    Minutes of Exercise per Session: 0 min  Stress: Stress Concern Present (01/18/2023)   [employer] of Occupational Health - Occupational Stress Questionnaire    Feeling of Stress : To some extent  Social Connections: Socially Integrated (07/29/2023)   Social Connection and Isolation Panel    Frequency of Communication with Friends and Family: More than three times a week    Frequency of Social Gatherings with Friends and Family: Once a week    Attends Religious Services: More than 4 times per year    Active Member of [employer] or Organizations: Yes    Attends Engineer, structural: More than 4 times per year    Marital Status: Married  Catering manager Violence: Not At Risk (07/30/2023)   Humiliation, Afraid, Rape, and Kick questionnaire    Fear of Current or Ex-Partner: No    Emotionally Abused: No    Physically Abused: No    Sexually Abused: No    Outpatient Medications Prior to Visit  Medication Sig Dispense Refill   acetaminophen  (TYLENOL ) 650 MG CR tablet Take 650-1,300 mg by mouth every 8 (eight) hours as needed for pain.     albuterol  (VENTOLIN  HFA) 108 (90 Base) MCG/ACT inhaler Inhale 1-2 puffs into the lungs  every 6 (six) hours as needed. 8 g 2   ascorbic acid (VITAMIN C) 500 MG tablet Take 500 mg by mouth every evening.      benralizumab  (FASENRA  PEN) 30 MG/ML prefilled autoinjector Inject 1 mL (30 mg total) into the skin every 8 (eight) weeks. ** AZ&ME PATIENT ASSISTANCE** 1 mL 2   Calcium -Magnesium -Vitamin D  (CALCIUM  MAGNESIUM  PO) Take 1 tablet by mouth at bedtime.     clonazePAM  (KLONOPIN ) 0.5 MG tablet Take 1 tablet (0.5 mg total) by mouth in the morning. 30 tablet 1   denosumab  (PROLIA ) 60 MG/ML SOSY injection Inject 60 mg into the skin every 6 (six) months.     doxepin  (SINEQUAN ) 25 MG capsule Take 1 capsule (25 mg total) by mouth at bedtime. 30 capsule 2   ELIQUIS  5 MG TABS tablet TAKE 1 TABLET BY MOUTH 2 TIMES DAILY. 60 tablet 4   Evolocumab  (REPATHA  SURECLICK) 140 MG/ML SOAJ Inject 140 mg into the skin every 14 (fourteen) days. (Patient taking differently: Inject 140 mg into the skin every 21 ( twenty-one) days.) 2 mL 11   famotidine  (PEPCID ) 20 MG tablet TAKE 1 TABLET BY MOUTH TWICE A DAY 60 tablet 1   ipratropium-albuterol  (DUONEB) 0.5-2.5 (3) MG/3ML SOLN Take 3 mLs by nebulization every 6 (six) hours as needed. (Patient taking differently: Take 3 mLs by nebulization every 6 (six) hours as needed (for shortness of breazth or wheezing).) 120 mL 5   nitroGLYCERIN  (NITROSTAT ) 0.4 MG SL tablet Place 1 tablet (0.4 mg total) under the tongue every 5 (five) minutes as needed for chest pain. 25 tablet 11   pindolol  (VISKEN ) 5 MG tablet Take 1 tablet (5 mg total) by mouth daily after breakfast. 30 tablet 2   risperiDONE  (RISPERDAL ) 0.5 MG tablet Take 1 tablet (0.5 mg total) by mouth 2 (two) times daily. Will start the 0.5 mg BID dosing in 2 weeks 60 tablet 1   Semaglutide ,0.25 or 0.5MG /DOS, 2 MG/1.5ML SOPN Inject 0.25 mg into the skin every Friday.     venlafaxine  XR (EFFEXOR -XR) 75 MG 24 hr capsule Take 3 capsules (225 mg total) by mouth daily with breakfast. 90 capsule 2   Facility-Administered Medications Prior to Visit  Medication Dose Route Frequency Provider Last Rate Last Admin   [START ON 06/09/2024] denosumab   (PROLIA ) injection 60 mg  60 mg Subcutaneous Q6 months Domenica Harlene LABOR, MD        Allergies  Allergen Reactions   Latuda  [Lurasidone  Hcl] Other (See Comments)    PER THE PT, CAUSED RESTLESSNESS   Beclomethasone Dipropionate Hives and Other (See Comments)    Weight gain, also   Flexeril  [Cyclobenzaprine ] Anxiety   Mometasone  Furo-Formoterol Fum Hives and Other (See Comments)    Weight gain, also   Sulfonamide Derivatives Hives and Rash   Statins Other (See Comments)    Myalgias, RLS   Fluticasone  Rash and Other (See Comments)    Weight gain, too    Review of Systems  Constitutional:  Positive for malaise/fatigue. Negative for chills and fever.  HENT:  Negative for congestion and hearing loss.   Eyes:  Negative for discharge.  Respiratory:  Negative for cough, sputum production and shortness of breath.   Cardiovascular:  Negative for chest pain, palpitations and leg swelling.  Gastrointestinal:  Positive for abdominal pain. Negative for blood in stool, constipation, diarrhea, heartburn, nausea and vomiting.  Genitourinary:  Negative for dysuria, frequency, hematuria and urgency.  Musculoskeletal:  Negative for back pain, falls and myalgias.  Skin:  Positive for rash.  Neurological:  Negative for dizziness, sensory change, loss of consciousness, weakness and headaches.  Endo/Heme/Allergies:  Negative for environmental allergies. Does not bruise/bleed easily.  Psychiatric/Behavioral:  Positive for depression. Negative for suicidal ideas. The patient is not nervous/anxious and does not have insomnia.        Objective:    Physical Exam Constitutional:      General: She is not in acute distress.    Appearance: Normal appearance. She is not diaphoretic.  HENT:     Head: Normocephalic and atraumatic.     Right Ear: Tympanic membrane, ear canal and external ear normal.     Left Ear: Tympanic membrane, ear canal and external ear normal.     Nose: Nose normal.     Mouth/Throat:      Mouth: Mucous membranes are moist.     Pharynx: Oropharynx is clear. No oropharyngeal exudate.  Eyes:     General: No scleral icterus.       Right eye: No discharge.        Left eye: No discharge.     Conjunctiva/sclera: Conjunctivae normal.     Pupils: Pupils are equal, round, and reactive to light.  Neck:     Thyroid : No thyromegaly.  Cardiovascular:     Rate and Rhythm: Normal rate and regular rhythm.     Heart sounds: Normal heart sounds. No murmur heard. Pulmonary:     Effort: Pulmonary effort is normal. No respiratory distress.     Breath sounds: Normal breath sounds. No wheezing or rales.  Abdominal:     General: Bowel sounds are normal. There is no distension.     Palpations: Abdomen is soft. There is no mass.     Tenderness: There is no abdominal tenderness.  Musculoskeletal:        General: No tenderness. Normal range of motion.     Cervical back: Normal range of motion and neck supple.  Lymphadenopathy:     Cervical: No cervical adenopathy.  Skin:    General: Skin is warm and dry.     Findings: Lesion present. No rash.     Comments: Violaceous thin skin above medial malleolus  Neurological:     General: No focal deficit present.     Mental Status: She is alert and oriented to person, place, and time.     Cranial Nerves: No cranial nerve deficit.     Coordination: Coordination normal.     Deep Tendon Reflexes: Reflexes are normal and symmetric. Reflexes normal.  Psychiatric:        Mood and Affect: Mood normal.        Behavior: Behavior normal.        Thought Content: Thought content normal.        Judgment: Judgment normal.     BP 118/66   Pulse 77   Resp 16   Ht 5' 2 (1.575 m)   Wt 164 lb 6.4 oz (74.6 kg)   SpO2 97%   BMI 30.07 kg/m  Wt Readings from Last 3 Encounters:  01/11/24 164 lb 6.4 oz (74.6 kg)  01/01/24 165 lb 3.2 oz (74.9 kg)  10/30/23 166 lb (75.3 kg)    Diabetic Foot Exam - Simple   No data filed    Lab Results  Component Value  Date   WBC 4.3 01/01/2024   HGB 11.5 (L) 01/01/2024   HCT 35.4 (L) 01/01/2024   PLT 288 01/01/2024   GLUCOSE 88 01/01/2024  CHOL 134 12/12/2023   TRIG 98.0 12/12/2023   HDL 54.70 12/12/2023   LDLDIRECT 177.8 09/07/2011   LDLCALC 60 12/12/2023   ALT 12 01/01/2024   AST 14 (L) 01/01/2024   NA 135 01/01/2024   K 4.3 01/01/2024   CL 102 01/01/2024   CREATININE 0.68 01/01/2024   BUN 15 01/01/2024   CO2 29 01/01/2024   TSH 2.29 12/12/2023   INR 1.0 08/21/2017   HGBA1C 5.5 12/12/2023    Lab Results  Component Value Date   TSH 2.29 12/12/2023   Lab Results  Component Value Date   WBC 4.3 01/01/2024   HGB 11.5 (L) 01/01/2024   HCT 35.4 (L) 01/01/2024   MCV 85.7 01/01/2024   PLT 288 01/01/2024   Lab Results  Component Value Date   NA 135 01/01/2024   K 4.3 01/01/2024   CO2 29 01/01/2024   GLUCOSE 88 01/01/2024   BUN 15 01/01/2024   CREATININE 0.68 01/01/2024   BILITOT 0.3 01/01/2024   ALKPHOS 82 01/01/2024   AST 14 (L) 01/01/2024   ALT 12 01/01/2024   PROT 6.5 01/01/2024   ALBUMIN  4.0 01/01/2024   CALCIUM  9.1 01/01/2024   ANIONGAP 4 (L) 01/01/2024   EGFR 101 09/23/2021   GFR 90.32 12/12/2023   Lab Results  Component Value Date   CHOL 134 12/12/2023   Lab Results  Component Value Date   HDL 54.70 12/12/2023   Lab Results  Component Value Date   LDLCALC 60 12/12/2023   Lab Results  Component Value Date   TRIG 98.0 12/12/2023   Lab Results  Component Value Date   CHOLHDL 2 12/12/2023   Lab Results  Component Value Date   HGBA1C 5.5 12/12/2023       Assessment & Plan:  Chronic diastolic CHF (congestive heart failure), NYHA class 2 (HCC) Assessment & Plan: Asymptomatic, continue to monitor and manage medically   Gastroesophageal reflux disease with hiatal hernia Assessment & Plan: Avoid offending foods, start probiotics. Do not eat large meals in late evening and consider raising head of bed.     Hyperglycemia Assessment & Plan: hgba1c  acceptable, minimize simple carbs. Increase exercise as tolerated.   Hyperlipidemia, mixed Assessment & Plan: Encourage heart healthy diet such as MIND or DASH diet, increase exercise, avoid trans fats, simple carbohydrates and processed foods, consider a krill or fish or flaxseed oil cap daily.    Muscle spasm Assessment & Plan: Hydrate and monitor    Preventative health care Assessment & Plan: Patient encouraged to maintain heart healthy diet, regular exercise, adequate sleep. Consider daily probiotics. Take medications as prescribed. Labs ordered and reviewed. MM 03/2023 repeat in 1 year. Pap negative August 2021 had TAH SPO last year she is seeing Dr Rigoberto her OB in the fall. Colonoscopy June 2021 repeat in 10 years.   Statin myopathy Assessment & Plan: Does not tolerate statins   Vascular insufficiency of extremity -     Ambulatory referral to Vascular Surgery  Skin texture changes -     Ambulatory referral to Vascular Surgery    Assessment and Plan Assessment & Plan Diverticulitis Acute left lower quadrant abdominal pain with diverticulitis. Previous treatment with ciprofloxacin  and metronidazole  was effective. Discussed potential side effects and importance of probiotics. - Prescribe ciprofloxacin  500 mg orally twice daily for 7 days. - Prescribe metronidazole  500 mg orally three times daily for 7 days. - Advise to monitor symptoms over the next 24-48 hours and start antibiotics if pain persists, bowel  habits change, or pain escalates. - Recommend taking a probiotic if antibiotics are started. - Advise to maintain hydration and follow a simple diet (BRAT diet).  Right leg vascular insufficiency Worsening skin changes on the right leg with atypical presentation for chronic venous insufficiency. Referral to a vascular specialist planned. - Refer to vascular specialist for arterial and venous studies.  Breast cancer follow-up Previous treatment with tamoxifen   discontinued due to pulmonary embolism risk. Regular screenings planned. Alternative medications not pursued due to minimal benefit. - Continue with regular screenings every six months. - Mammogram scheduled for April 10, 2024. - Breast MRI to be done six months after mammogram.  General Health Maintenance Discussion of health maintenance including vaccinations, screenings, and lifestyle modifications. RSV vaccine recommended due to decreased risk of hospitalization and severe illness. - Recommend RSV vaccine (Arexvy). - Tetanus booster due in September 2026 or sooner if injured. - Encourage hydration with 60-80 ounces of water daily. - Advise protein intake every 3-4 hours. - Encourage physical activity, aiming for 5,000-7,000 steps daily.  Follow-up Plan for follow-up care and monitoring of current health issues. - Follow up in six months unless new issues arise. - Ensure referral to vascular specialist is completed. - Monitor response to antibiotics if started for diverticulitis.     Harlene Horton, MD

## 2024-01-24 DIAGNOSIS — M4120 Other idiopathic scoliosis, site unspecified: Secondary | ICD-10-CM | POA: Diagnosis not present

## 2024-01-24 DIAGNOSIS — M545 Low back pain, unspecified: Secondary | ICD-10-CM | POA: Diagnosis not present

## 2024-01-24 DIAGNOSIS — M542 Cervicalgia: Secondary | ICD-10-CM | POA: Diagnosis not present

## 2024-01-30 DIAGNOSIS — M542 Cervicalgia: Secondary | ICD-10-CM | POA: Diagnosis not present

## 2024-01-30 DIAGNOSIS — M545 Low back pain, unspecified: Secondary | ICD-10-CM | POA: Diagnosis not present

## 2024-01-30 DIAGNOSIS — M4120 Other idiopathic scoliosis, site unspecified: Secondary | ICD-10-CM | POA: Diagnosis not present

## 2024-01-31 ENCOUNTER — Other Ambulatory Visit: Payer: Self-pay | Admitting: Physician Assistant

## 2024-02-01 NOTE — Telephone Encounter (Signed)
 Refill Request.

## 2024-02-12 DIAGNOSIS — M542 Cervicalgia: Secondary | ICD-10-CM | POA: Diagnosis not present

## 2024-02-12 DIAGNOSIS — M4120 Other idiopathic scoliosis, site unspecified: Secondary | ICD-10-CM | POA: Diagnosis not present

## 2024-02-12 DIAGNOSIS — M545 Low back pain, unspecified: Secondary | ICD-10-CM | POA: Diagnosis not present
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
## 2024-02-13 ENCOUNTER — Ambulatory Visit (INDEPENDENT_AMBULATORY_CARE_PROVIDER_SITE_OTHER)

## 2024-02-13 VITALS — Ht 62.0 in | Wt 164.0 lb

## 2024-02-13 DIAGNOSIS — Z0000 Encounter for general adult medical examination without abnormal findings: Secondary | ICD-10-CM

## 2024-02-13 NOTE — Progress Notes (Signed)
 Subjective:   Victoria [name] is a 68 y.o. who presents for a Medicare Wellness preventive visit.  As a reminder, Annual Wellness Visits don't include a physical exam, and some assessments may be limited, especially if this visit is performed virtually. We may recommend an in-person follow-up visit with your provider if needed.  Visit Complete: Virtual I connected with  [name] on 02/13/24 by a audio enabled telemedicine application and verified that I am speaking with the correct person using two identifiers.  Patient Location: Home  Provider Location: Home Office  I discussed the limitations of evaluation and management by telemedicine. The patient expressed understanding and agreed to proceed.  Vital Signs: Because this visit was a virtual/telehealth visit, some criteria may be missing or patient reported. Any vitals not documented were not able to be obtained and vitals that have been documented are patient reported.  VideoDeclined- This patient declined Librarian, academic. Therefore the visit was completed with audio only.  Persons Participating in Visit: Patient.  AWV Questionnaire: No: Patient Medicare AWV questionnaire was not completed prior to this visit.  Cardiac Risk Factors include: advanced age (>70men, >39 women);dyslipidemia     Objective:    Today's Vitals   02/13/24 1012  Weight: 164 lb (74.4 kg)  Height: 5' 2 (1.575 m)   Body mass index is 30 kg/m.     02/13/2024   10:23 AM 01/01/2024   12:16 PM 07/29/2023    1:47 PM 07/28/2023    8:24 PM 07/12/2023    1:16 PM 02/27/2023   11:48 AM 02/10/2023    9:00 AM  Advanced Directives  Does Patient Have a Medical Advance Directive? Yes Yes Yes Yes Yes Yes Yes  Type of Estate agent of [place];Living will Living will Healthcare Power of [place];Living will Healthcare Power of [place];Living will Healthcare Power of [place];Living will Living will Healthcare Power  of [place];Living will  Does patient want to make changes to medical advance directive? No - Patient declined No - Patient declined No - Patient declined   No - Patient declined No - Patient declined  Copy of Healthcare Power of Attorney in Chart? Yes - validated most recent copy scanned in chart (See row information) No - copy requested No - copy requested    Yes - validated most recent copy scanned in chart (See row information)  Would patient like information on creating a medical advance directive?  No - Patient declined         Current Medications (verified) Outpatient Encounter Medications as of 02/13/2024  Medication Sig   acetaminophen  (TYLENOL ) 650 MG CR tablet Take 650-1,300 mg by mouth every 8 (eight) hours as needed for pain.   albuterol  (VENTOLIN  HFA) 108 (90 Base) MCG/ACT inhaler Inhale 1-2 puffs into the lungs every 6 (six) hours as needed.   ascorbic acid (VITAMIN C) 500 MG tablet Take 500 mg by mouth every evening.   benralizumab  (FASENRA  PEN) 30 MG/ML prefilled autoinjector Inject 1 mL (30 mg total) into the skin every 8 (eight) weeks. ** AZ&ME PATIENT ASSISTANCE**   Calcium -Magnesium -Vitamin D  (CALCIUM  MAGNESIUM  PO) Take 1 tablet by mouth at bedtime.   clonazePAM  (KLONOPIN ) 0.5 MG tablet Take 1 tablet (0.5 mg total) by mouth in the morning.   denosumab  (PROLIA ) 60 MG/ML SOSY injection Inject 60 mg into the skin every 6 (six) months.   doxepin  (SINEQUAN ) 25 MG capsule Take 1 capsule (25 mg total) by mouth at bedtime.   doxycycline  (  VIBRA -TABS) 100 MG tablet Take 1 tablet (100 mg total) by mouth 2 (two) times daily.   ELIQUIS  5 MG TABS tablet TAKE 1 TABLET BY MOUTH 2 TIMES DAILY.   famotidine  (PEPCID ) 20 MG tablet TAKE 1 TABLET BY MOUTH TWICE A DAY   ipratropium-albuterol  (DUONEB) 0.5-2.5 (3) MG/3ML SOLN Take 3 mLs by nebulization every 6 (six) hours as needed. (Patient taking differently: Take 3 mLs by nebulization every 6 (six) hours as needed (for shortness of breazth or  wheezing).)   nitroGLYCERIN  (NITROSTAT ) 0.4 MG SL tablet Place 1 tablet (0.4 mg total) under the tongue every 5 (five) minutes as needed for chest pain.   pindolol  (VISKEN ) 5 MG tablet Take 1 tablet (5 mg total) by mouth daily after breakfast.   REPATHA  SURECLICK 140 MG/ML SOAJ INJECT 140 MG INTO THE SKIN EVERY 14 DAYS.   risperiDONE  (RISPERDAL ) 0.5 MG tablet Take 1 tablet (0.5 mg total) by mouth 2 (two) times daily. Will start the 0.5 mg BID dosing in 2 weeks   Semaglutide ,0.25 or 0.5MG /DOS, 2 MG/1.5ML SOPN Inject 0.25 mg into the skin every Friday.   venlafaxine  XR (EFFEXOR -XR) 75 MG 24 hr capsule Take 3 capsules (225 mg total) by mouth daily with breakfast.   Facility-Administered Encounter Medications as of 02/13/2024  Medication   [START ON 06/09/2024] denosumab  (PROLIA ) injection 60 mg    Allergies (verified) Latuda  [lurasidone  hcl], Beclomethasone dipropionate, Flexeril  [cyclobenzaprine ], Mometasone  furo-formoterol fum, Sulfonamide derivatives, Statins, and Fluticasone    History: Past Medical History:  Diagnosis Date   Allergic bronchopulmonary aspergillosis (HCC) 2008   sees Dr Katheren Finn pulmonology   Anemia    iron  deficiency, resolved   Anxiety    Asthma    Breast cancer (HCC)    CAD (coronary artery disease)    a. LHC 6/16:  oOM1 60, pRCA 25 >> med Rx b. cath 3/19 2nd OM with 95% stenosis s/p synergy DES & anomalous RCA   CAP (community acquired pneumonia) 2016; 06/07/2016   Chronic bronchitis (HCC)    Chronic lower back pain    Clotting disorder (HCC) 07/04/2020   maroon blood in stools - 3 episodes in two hours   Complication of anesthesia    think I have a hard time waking up from it   COPD (chronic obstructive pulmonary disease) (HCC)    Depression    mild   Diverticulitis    Diverticulosis    GERD (gastroesophageal reflux disease)    H/O hiatal hernia    Headache    weekly (08/23/2017)   History of echocardiogram    Echo 6/16:  Mod LVH, EF  60-65%, no RWMA, Gr 1 DD, trivial MR, normal LA size.   History of radiation therapy    Left breast- 12/26/22-01/23/23-Dr. Lynwood Nasuti   Hyperglycemia 11/20/2015   Hyperlipidemia, mixed 09/11/2007   Qualifier: Diagnosis of  By: Antonio ROSALEA Rockers   Did not tolerate Lipitor, zocor, Lovastatin, Pravastatin, Livalo , Crestor  even low dose    IBS (irritable bowel syndrome)    Maxillary sinusitis    Normal cardiac stress test 11/2011   No evidence of ischemia or infarct.   Calculated ejection fraction 72%.   Obesity    OSA (obstructive sleep apnea) 02/2012   has stopped using  cpap   Osteoarthritis    Osteoporosis    Personal history of radiation therapy    Pneumonia 11/2011   before 2013 I hadn't had pneumonia since I was a child (04/13/2012)   Pulmonary nodules  S/P angioplasty with stent 08/23/17 ostial 2nd OM with DES synnergy 08/24/2017   Schatzki's ring    Past Surgical History:  Procedure Laterality Date   ANTERIOR AND POSTERIOR REPAIR N/A 10/18/2022   Procedure: ANTERIOR (CYSTOCELE);  Surgeon: Mat Browning, MD;  [facility];  Service: Gynecology;  Laterality: N/A;   APPENDECTOMY  1989   BREAST BIOPSY  11/03/2022   MM LT RADIOACTIVE SEED LOC MAMMO GUIDE 11/03/2022 [facility] MAMMOGRAPHY   BREAST LUMPECTOMY WITH RADIOACTIVE SEED LOCALIZATION Left 11/04/2022   Procedure: LEFT BREAST LUMPECTOMY WITH RADIOACTIVE SEED LOCALIZATION;  Surgeon: Curvin Deward MOULD, MD;  Location: [facility];  Service: General;  Laterality: Left;   CARDIAC CATHETERIZATION N/A 11/25/2014   Procedure: Right/Left Heart Cath and Coronary Angiography;  Surgeon: Victory LELON Sharps, MD;  Location: [facility] INVASIVE CV LAB;  Service: Cardiovascular;  Laterality: N/A;   CESAREAN SECTION  1985   COLONOSCOPY  03/2022   CORONARY ANGIOPLASTY WITH STENT PLACEMENT  08/23/2017   CORONARY STENT INTERVENTION N/A 08/23/2017   Procedure: CORONARY STENT INTERVENTION;  Surgeon: Verlin Lonni BIRCH, MD;  Location: [facility]  INVASIVE CV LAB;  Service: Cardiovascular;  Laterality: N/A;   HERNIA REPAIR  04/13/2012   VHR laparoscopic   LAPAROSCOPIC BILATERAL SALPINGO OOPHERECTOMY Bilateral 10/18/2022   Procedure: LAPAROTOMY WITH BILATERAL SALPINGO OOPHORECTOMY;  Surgeon: Mat Browning, MD;  Location: [facility] OR;  Service: Gynecology;  Laterality: Bilateral;   LEFT HEART CATH AND CORONARY ANGIOGRAPHY N/A 08/23/2017   Procedure: LEFT HEART CATH AND CORONARY ANGIOGRAPHY;  Surgeon: Verlin Lonni BIRCH, MD;  Location: [facility] INVASIVE CV LAB;  Service: Cardiovascular;  Laterality: N/A;   VAGINAL HYSTERECTOMY N/A 10/18/2022   Procedure: HYSTERECTOMY VAGINAL;  Surgeon: Mat Browning, MD;  Location[facility] OR;  Service: Gynecology;  Laterality: N/A;   VENTRAL HERNIA REPAIR  04/13/2012   Procedure: LAPAROSCOPIC VENTRAL HERNIA;  Surgeon: Elon CHRISTELLA Pacini, MD;  Location: [facility] OR;  Service: General;  Laterality: N/A;  laparoscopic repair of incarcerated hernia   Family History  Problem Relation Age of Onset   Breast cancer Mother 63       bilateral breast cancer dx. 45, met to liver   Hypertension Mother    Diabetes Mother    Cancer Mother    Diverticulosis Father    Prostate cancer Father    Breast cancer Sister 54       DCIS at 69, IDC at 70, PALB2+   Cancer Sister        breast cancer, invasive ductal carcinoma in 2022,DCIS at 76 with 4 weeks of radiation, 5 years of Tamoxifen     Depression Sister    Hyperlipidemia Sister    Pulmonary embolism Brother        recurrent   Heart attack Maternal Grandfather    Ovarian cancer Paternal Grandmother    Cerebral palsy Son    Prostate cancer Paternal Uncle    Breast cancer Niece 72       PALB2+   Osteoporosis Niece    Stroke Neg Hx    Colon cancer Neg Hx    Esophageal cancer Neg Hx    Stomach cancer Neg Hx    Rectal cancer Neg Hx    Social History   Socioeconomic History   Marital status: Married    Spouse name: Not on file   Number of children: 1   Years of education:  14   Highest education level: Associate degree: academic program  Occupational History   Occupation: Disabled   Tobacco Use  Smoking status: Never    Passive exposure: Never   Smokeless tobacco: Never  Vaping Use   Vaping status: Never Used  Substance and Sexual Activity   Alcohol use: Yes    Alcohol/week: 4.0 - 7.0 standard drinks of alcohol    Types: 4 - 7 Standard drinks or equivalent per week   Drug use: No   Sexual activity: Yes    Birth control/protection: Surgical    Comment: gluten free, lives with husband and son with CP quadriplegia  Other Topics Concern   Not on file  Social History Narrative   Cares for son with cerebral palsy.    Lives at home with her husband and son.   Right-handed.   2 cups caffeine per day.   One story home   Social Drivers of Health   Financial Resource Strain: Low Risk  (01/18/2023)   Overall Financial Resource Strain (CARDIA)    Difficulty of Paying Living Expenses: Not hard at all  Food Insecurity: No Food Insecurity (02/13/2024)   Hunger Vital Sign    Worried About Running Out of Food in the Last Year: Never true    Ran Out of Food in the Last Year: Never true  Transportation Needs: No Transportation Needs (02/13/2024)   PRAPARE - Administrator, Civil Service (Medical): No    Lack of Transportation (Non-Medical): No  Physical Activity: Inactive (02/13/2024)   Exercise Vital Sign    Days of Exercise per Week: 0 days    Minutes of Exercise per Session: 0 min  Stress: No Stress Concern Present (02/13/2024)   [employer] of Occupational Health - Occupational Stress Questionnaire    Feeling of Stress: Only a little  Social Connections: Socially Integrated (02/13/2024)   Social Connection and Isolation Panel    Frequency of Communication with Friends and Family: More than three times a week    Frequency of Social Gatherings with Friends and Family: Twice a week    Attends Religious Services: More than 4 times per year     Active Member of [employer] or Organizations: Yes    Attends Engineer, structural: More than 4 times per year    Marital Status: Married    Tobacco Counseling Counseling given: Not Answered    Clinical Intake:  Pre-visit preparation completed: Yes  Pain : No/denies pain  Diabetes: No  Lab Results  Component Value Date   HGBA1C 5.5 12/12/2023   HGBA1C 5.3 04/25/2023   HGBA1C 5.4 01/19/2023     How often do you need to have someone help you when you read instructions, pamphlets, or other written materials from your doctor or pharmacy?: 1 - Never  Interpreter Needed?: No  Information entered by :: Charmaine Bloodgood LPN   Activities of Daily Living     02/13/2024   10:23 AM 07/29/2023    1:47 PM  In your present state of health, do you have any difficulty performing the following activities:  Hearing? 0 0  Vision? 0 0  Difficulty concentrating or making decisions? 0 0  Walking or climbing stairs? 0   Dressing or bathing? 0   Doing errands, shopping? 0 1  Preparing Food and eating ? N   Using the Toilet? N   In the past six months, have you accidently leaked urine? N   Do you have problems with loss of bowel control? N   Managing your Medications? N   Managing your Finances? N   Housekeeping or managing your Housekeeping?  N     Patient Care Team: Domenica Harlene LABOR, MD as PCP - General (Family Medicine) Lavona Agent, MD as PCP - Cardiology (Cardiology) [place], Tawni CROME, MD as Referring Physician (Dermatology) Geronimo Amel, MD as Consulting Physician (Pulmonary Disease) Maranda Leim DEL, MD as Consulting Physician (Cardiology) Mat Browning, MD as Consulting Physician (Obstetrics and Gynecology) Roark Rush, MD as Consulting Physician (Otolaryngology) Pyrtle, Gordy HERO, MD as Consulting Physician (Gastroenterology) Tyree Nanetta SAILOR, RN as Oncology Nurse Navigator Glean, Stephane BROCKS, RN (Inactive) as Oncology Nurse Navigator Curvin Deward MOULD, MD as  Consulting Physician (General Surgery) Lanny Callander, MD as Consulting Physician (Hematology) Shannon Agent, MD as Consulting Physician (Radiation Oncology) Cleotilde Elspeth CROME, OD (Optometry) Carvin Arvella HERO, MD as Consulting Physician (Psychiatry)  I have updated your Care Teams any recent Medical Services you may have received from other providers in the past year.     Assessment:   This is a routine wellness examination for [place].  Hearing/Vision screen Hearing Screening - Comments:: Denies hearing difficulties   Vision Screening - Comments:: Wears rx glasses - up to date with routine eye exams with [facility]    Goals Addressed             This Visit's Progress    Maintain health and independence   On track      Depression Screen     02/13/2024   10:22 AM 01/11/2024    1:19 PM 01/01/2024   12:17 PM 07/12/2023    1:16 PM 02/10/2023    9:04 AM 01/19/2023    2:03 PM 10/13/2022    1:15 PM  PHQ 2/9 Scores  PHQ - 2 Score 0 0 0 0 0 0 0  PHQ- 9 Score 0 0     0    Fall Risk     02/13/2024   10:23 AM 08/02/2023    1:12 PM 07/25/2023   10:20 AM 07/12/2023    1:16 PM 02/10/2023    9:04 AM  Fall Risk   Falls in the past year? 0 0 0 0 1  Number falls in past yr: 0 0  0 0  Injury with Fall? 0 0   0  Risk for fall due to : No Fall Risks   Impaired balance/gait No Fall Risks  Follow up Falls prevention discussed;Education provided;Falls evaluation completed   Falls evaluation completed Falls evaluation completed    MEDICARE RISK AT HOME:  Medicare Risk at Home Any stairs in or around the home?: No If so, are there any without handrails?: No Home free of loose throw rugs in walkways, pet beds, electrical cords, etc?: Yes Adequate lighting in your home to reduce risk of falls?: Yes Life alert?: No Use of a cane, walker or w/c?: No Grab bars in the bathroom?: Yes Shower chair or bench in shower?: No Elevated toilet seat or a handicapped toilet?: Yes  TIMED UP AND GO:  Was the  test performed?  No  Cognitive Function: Declined/Normal: No cognitive concerns noted by patient or family. Patient alert, oriented, able to answer questions appropriately and recall recent events. No signs of memory loss or confusion.        02/10/2023    9:08 AM 10/19/2021    9:09 AM  6CIT Screen  What Year? 0 points 0 points  What month? 0 points 0 points  What time? 0 points 0 points  Count back from 20 0 points 0 points  Months in reverse 0 points 0 points  Repeat phrase 0 points 0 points  Total Score 0 points 0 points    Immunizations Immunization History  Administered Date(s) Administered   INFLUENZA, HIGH DOSE SEASONAL PF 05/05/2015   Influenza Split 04/20/2011, 04/20/2020   Influenza Whole 06/06/2007, 04/15/2008, 04/02/2009, 03/29/2012   Influenza,inj,Quad PF,6+ Mos 05/09/2013, 03/03/2014, 05/03/2016, 04/21/2017, 04/02/2018, 03/19/2019, 04/20/2020, 05/03/2021, 03/07/2022   Influenza-Unspecified 04/17/2023   Moderna Sars-Covid-2 Vaccination 08/15/2019, 09/17/2019, 05/16/2020, 12/04/2020   PNEUMOCOCCAL CONJUGATE-20 04/25/2023   Pfizer Covid-19 Vaccine Bivalent Booster 47yrs & up 04/16/2021   Pneumococcal Conjugate-13 05/09/2013   Pneumococcal Polysaccharide-23 05/04/2005   Td 07/29/2009   Tdap 03/11/2015   Zoster Recombinant(Shingrix) 05/09/2019, 08/01/2019    Screening Tests Health Maintenance  Topic Date Due   INFLUENZA VACCINE  01/19/2024   COVID-19 Vaccine (6 - 2024-25 season) 06/19/2024 (Originally 02/19/2023)   Medicare Annual Wellness (AWV)  02/12/2025   DTaP/Tdap/Td (3 - Td or Tdap) 03/10/2025   MAMMOGRAM  10/10/2025   Colonoscopy  03/21/2032   Pneumococcal Vaccine: 50+ Years  Completed   DEXA SCAN  Completed   Hepatitis C Screening  Completed   Zoster Vaccines- Shingrix  Completed   HPV VACCINES  Aged Out   Meningococcal B Vaccine  Aged Out    Health Maintenance  Health Maintenance Due  Topic Date Due   INFLUENZA VACCINE  01/19/2024     Additional Screening:  Vision Screening: Recommended annual ophthalmology exams for early detection of glaucoma and other disorders of the eye. Would you like a referral to an eye doctor? No    Dental Screening: Recommended annual dental exams for proper oral hygiene  Community Resource Referral / Chronic Care Management: CRR required this visit?  No   CCM required this visit?  No   Plan:    I have personally reviewed and noted the following in the patient's chart:   Medical and social history Use of alcohol, tobacco or illicit drugs  Current medications and supplements including opioid prescriptions. Patient is not currently taking opioid prescriptions. Functional ability and status Nutritional status Physical activity Advanced directives List of other physicians Hospitalizations, surgeries, and ER visits in previous 12 months Vitals Screenings to include cognitive, depression, and falls Referrals and appointments  In addition, I have reviewed and discussed with patient certain preventive protocols, quality metrics, and best practice recommendations. A written personalized care plan for preventive services as well as general preventive health recommendations were provided to patient.   Lavelle Pfeiffer [place], [place]   [id number]   After Visit Summary: (MyChart) Due to this being a telephonic visit, the after visit summary with patients personalized plan was offered to patient via MyChart   Notes: Nothing significant to report at this time.

## 2024-02-13 NOTE — Patient Instructions (Signed)
 Ms. Wallick , Thank you for taking time out of your busy schedule to complete your Annual Wellness Visit with me. I enjoyed our conversation and look forward to speaking with you again next year. I, as well as your care team,  appreciate your ongoing commitment to your health goals. Please review the following plan we discussed and let me know if I can assist you in the future. Your Game plan/ To Do List     Follow up Visits: We will see or speak with you next year for your Next Medicare AWV with our clinical staff Have you seen your provider in the last 6 months (3 months if uncontrolled diabetes)? Yes  Clinician Recommendations:  Aim for 30 minutes of exercise or brisk walking, 6-8 glasses of water, and 5 servings of fruits and vegetables each day.       This is a list of the screenings recommended for you:  Health Maintenance  Topic Date Due   Flu Shot  01/19/2024   COVID-19 Vaccine (6 - 2024-25 season) 06/19/2024*   Medicare Annual Wellness Visit  02/12/2025   DTaP/Tdap/Td vaccine (3 - Td or Tdap) 03/10/2025   Mammogram  10/10/2025   Colon Cancer Screening  03/21/2032   Pneumococcal Vaccine for age over 76  Completed   DEXA scan (bone density measurement)  Completed   Hepatitis C Screening  Completed   Zoster (Shingles) Vaccine  Completed   HPV Vaccine  Aged Out   Meningitis B Vaccine  Aged Out  *Topic was postponed. The date shown is not the original due date.    Advanced directives: (In Chart) A copy of your advanced directives are scanned into your chart should your provider ever need it.  Advance Care Planning is important because it:  [x]  Makes sure you receive the medical care that is consistent with your values, goals, and preferences  [x]  It provides guidance to your family and loved ones and reduces their decisional burden about whether or not they are making the right decisions based on your wishes.  Follow the link provided in your after visit summary or read over  the paperwork we have mailed to you to help you started getting your Advance Directives in place. If you need assistance in completing these, please reach out to us  so that we can help you!  See attachments for Preventive Care and Fall Prevention Tips.

## 2024-02-15 ENCOUNTER — Telehealth (HOSPITAL_BASED_OUTPATIENT_CLINIC_OR_DEPARTMENT_OTHER): Admitting: Psychiatry

## 2024-02-15 ENCOUNTER — Encounter (HOSPITAL_COMMUNITY): Payer: Self-pay | Admitting: Psychiatry

## 2024-02-15 DIAGNOSIS — F32A Depression, unspecified: Secondary | ICD-10-CM | POA: Diagnosis not present

## 2024-02-15 DIAGNOSIS — F419 Anxiety disorder, unspecified: Secondary | ICD-10-CM | POA: Diagnosis not present

## 2024-02-15 DIAGNOSIS — G4733 Obstructive sleep apnea (adult) (pediatric): Secondary | ICD-10-CM

## 2024-02-15 MED ORDER — CLONAZEPAM 0.5 MG PO TABS: 0.5000 mg | ORAL_TABLET | Freq: Every morning | ORAL | 1 refills | Status: DC

## 2024-02-22 DIAGNOSIS — M542 Cervicalgia: Secondary | ICD-10-CM | POA: Diagnosis not present

## 2024-02-22 DIAGNOSIS — M4120 Other idiopathic scoliosis, site unspecified: Secondary | ICD-10-CM | POA: Diagnosis not present

## 2024-02-22 DIAGNOSIS — M545 Low back pain, unspecified: Secondary | ICD-10-CM | POA: Diagnosis not present

## 2024-03-12 ENCOUNTER — Encounter (HOSPITAL_COMMUNITY): Payer: Self-pay

## 2024-03-12 DIAGNOSIS — F419 Anxiety disorder, unspecified: Secondary | ICD-10-CM

## 2024-03-12 MED ORDER — RISPERIDONE 0.5 MG PO TABS: 0.5000 mg | ORAL_TABLET | Freq: Every day | ORAL | 2 refills | Status: DC

## 2024-03-13 DIAGNOSIS — M4120 Other idiopathic scoliosis, site unspecified: Secondary | ICD-10-CM | POA: Diagnosis not present

## 2024-03-13 DIAGNOSIS — M542 Cervicalgia: Secondary | ICD-10-CM | POA: Diagnosis not present

## 2024-03-13 DIAGNOSIS — M545 Low back pain, unspecified: Secondary | ICD-10-CM | POA: Diagnosis not present

## 2024-03-18 NOTE — Progress Notes (Unsigned)
 BH MD/PA/NP OP Progress Note  03/19/2024 4:34 PM Victoria [name]  MRN:  [id number]  Visit Diagnosis:    ICD-10-CM   1. Tardive dyskinesia  G24.01 valbenazine (INGREZZA) 40 MG capsule    2. Anxiety and depression  F41.9 venlafaxine  XR (EFFEXOR -XR) 75 MG 24 hr capsule   F32.A doxepin  (SINEQUAN ) 25 MG capsule    pindolol  (VISKEN ) 5 MG tablet    clonazePAM  (KLONOPIN ) 0.5 MG tablet      Assessment: Victoria [name] is a 68 y.o. female with a history of anxiety, depression, OSA who presented to [facility] for initial evaluation on 06/30/2022.  At initial evaluation patient reported symptoms of anxiety including feeling nervous or on edge, being unable to stop controlled worrying, worrying too much about different things, and fear that something awful would happen.  Patient's symptoms have been gradually progressing.  There was a period in November 2023 where she was having passive SI without intent or plan.  Patient also did endorse low mood, poor sleep, difficulty concentrating.  She does have a history of obstructive sleep apnea and does not use CPAP with last sleep study being over 10 years ago. Psychosocially patient has increased stress of caring for her adult son with cerebral palsy and planning the next steps for him after she and her husband pass or are no longer able to care for him.  Patient met criteria for MDD and generalized anxiety disorder.   Victoria [name] Victoria [name] presents for follow-up evaluation. Today, 03/19/24, patient reports stable anxiety and mood symptoms.  She had emptied to discontinue the Risperdal  evening dose however experienced worsening anxiety and depressive symptoms after both trials.  Given that she continued on the 0.5 mg dose.  Patient completed aims assessment today and scored a 5 for tongue, lip, and mouth movements that she is unaware of and cannot control.  Given that we are going to remain on the risperidone  recommended starting Ingrezza to  manage symptoms.  Risk and benefits reviewed.  Will continue remainder of current regimen and follow-up in 2 months  Plan: - Continue Effexor  XR 150 mg QAM and 75 mg QHS - Restart Risperdal  0.5 mg at bedtime - Continue Doxepin  25 mg QHS - Continue Klonopin  0.5 mg QD prn for anxiety - Continue pindolol  5 mg every day - Start Ingrezza 40 mg QHS - Continue Semaglutide  in the interim managed by an online provider, patient plans to DC in a couple weeks due to lack of benefit. - Recommend repeat sleep study, could consider dental devices OSA still present - CMP, CBC, lipid profile, Vit D, A1c, TSH reviewed - Continue with therapy [place] - Completed a 10 week wellness group through the [facility], followed by a group for special needs parents - Discussed other support groups - Crisis resources reviewed - Follow up in a month  Risk Assessment: An assessment of suicide and violence risk factors was performed as part of this evaluation and is not significantly changed from the last visit. While future psychiatric events cannot be accurately predicted, the patient does not currently require acute inpatient psychiatric care and does not currently meet [place]  involuntary commitment criteria. Patient was given contact information for crisis resources, behavioral health clinic and was instructed to call 911 for emergencies.    Chief Complaint:  Chief Complaint  Patient presents with   Follow-up   HPI: On presentation today Victoria [name] reports that it has been a long day but overall  she is doing okay.  There have been some ongoing issues with her foot  (peroneal tendonitis) and she spent several hours with the orthopedist before coming here.  While she is happy that it was treated she is a bit disappointed that she is going to have to have a course of prednisone  and wear a boot.  She understands the need for this however given that the shoe adjustment from last year has not been  sufficiently managing it.  Victoria [name] believes that the foot was exacerbated due to the increased movement and walking lately.  She had already been more active and then to him was home for a 5-day stretch after getting aspiration pneumonia.  During that time she had to be up and on her feet more consistently.  Things at the group home continue to have up-and-down's.  There has been 1 worker who has not been a good fit with her son and Victoria [name] also recently discovered that he had not been receiving his liquid medications over the past month.  This could explain some of the agitation he had experienced as one of the medications was Lexapro.  She has talked with her send provider who will change the liquid formulary to a tablet that can be crushed and given to the patient which should hopefully improve compliance at the group home.  Victoria [name] is very helpful that a group home will still be a good fit as it has made her life much more manageable now that she is not caring for her son 24/7.  Patient still can get the anxiety in the morning when she is more relaxed and focusing on prayer. This is usually about an hour before she takes her morning in the meds. She has started taking the pindolol  and the Klonopin  in the morng which has helped this.  Victoria [name] is uncertain what leads to this anxiety as there is not anything particularly negative in her life.  She wonders if part of this is missing to him and not having as much to keep her busy in the mornings.  Outside of this the main source of anxiety is when thinking about things such as flying.  Patient and her husband had a goal of going to [facility] in the past which was delayed due to caring for Victoria [name].  Now that they have the time patient is uncertain she wants to go due to anxiety about if she is arising with the flight.  Victoria [name] had tried to stop the Risperdal  in the interim on 2 occasions.  The first occasion she had a panic attack the next morning and restarted the Risperdal .   On the second occasion she experienced increased depression the following day.  Given this we will continue Resporal 0.5 mg dose and can consider a slower taper in the future.  She had endorsed uncontrolled tongue movements, lip movements and facial expressions that she is not aware of until 10 to 20 seconds after they occur.  Given description and symptoms there is concern for tardive dyskinesia.  Aims was completed today and patient scored a 5.  As we are going to remain on the Risperdal  now we suggested starting Ingrezza and reviewed the risk and benefits.  Past Psychiatric History:  One  prior psychiatric hospitalization to [facility] in February of 2024 due to increased depression/anxiety. No prior suicide attempts.  Patient did endorse passive SI in November of 2023 without any intent or plan.  She denies being connected with a psychiatrist in the past  and had recently connected with a therapist at [facility] therapy.  Has taken Effexor  (possible restlessness at 225), Cymbalta , Remeron , Ativan , Xanax , Klonopin , gabapentin , BuSpar , Zyprexa  (TD), Latuda  (akathisia), Risperdal , Doxepin ,   Denies substance use other than 1 beer a day. Can increase to 2 when the weather is nice.   Past Medical History:  Past Medical History:  Diagnosis Date   Allergic bronchopulmonary aspergillosis (HCC) 2008   sees Dr Katheren Finn pulmonology   Anemia    iron  deficiency, resolved   Anxiety    Asthma    Breast cancer (HCC)    CAD (coronary artery disease)    a. LHC 6/16:  oOM1 60, pRCA 25 >> med Rx b. cath 3/19 2nd OM with 95% stenosis s/p synergy DES & anomalous RCA   CAP (community acquired pneumonia) 2016; 06/07/2016   Chronic bronchitis (HCC)    Chronic lower back pain    Clotting disorder 07/04/2020   maroon blood in stools - 3 episodes in two hours   Complication of anesthesia    think I have a hard time waking up from it   COPD (chronic obstructive pulmonary disease) (HCC)    Depression    mild    Diverticulitis    Diverticulosis    GERD (gastroesophageal reflux disease)    H/O hiatal hernia    Headache    weekly (08/23/2017)   History of echocardiogram    Echo 6/16:  Mod LVH, EF 60-65%, no RWMA, Gr 1 DD, trivial MR, normal LA size.   History of radiation therapy    Left breast- 12/26/22-01/23/23-Dr. Lynwood Nasuti   Hyperglycemia 11/20/2015   Hyperlipidemia, mixed 09/11/2007   Qualifier: Diagnosis of  By: Antonio ROSALEA Rockers   Did not tolerate Lipitor, zocor, Lovastatin, Pravastatin, Livalo , Crestor  even low dose    IBS (irritable bowel syndrome)    Maxillary sinusitis    Normal cardiac stress test 11/2011   No evidence of ischemia or infarct.   Calculated ejection fraction 72%.   Obesity    OSA (obstructive sleep apnea) 02/2012   has stopped using  cpap   Osteoarthritis    Osteoporosis    Personal history of radiation therapy    Pneumonia 11/2011   before 2013 I hadn't had pneumonia since I was a child (04/13/2012)   Pulmonary nodules    S/P angioplasty with stent 08/23/17 ostial 2nd OM with DES synnergy 08/24/2017   Schatzki's ring     Past Surgical History:  Procedure Laterality Date   ANTERIOR AND POSTERIOR REPAIR N/A 10/18/2022   Procedure: ANTERIOR (CYSTOCELE);  Surgeon: Mat Browning, MD;  Location: [facility] OR;  Service: Gynecology;  Laterality: N/A;   APPENDECTOMY  1989   BREAST BIOPSY  11/03/2022   MM LT RADIOACTIVE SEED LOC MAMMO GUIDE 11/03/2022 [facility] MAMMOGRAPHY   BREAST LUMPECTOMY WITH RADIOACTIVE SEED LOCALIZATION Left 11/04/2022   Procedure: LEFT BREAST LUMPECTOMY WITH RADIOACTIVE SEED LOCALIZATION;  Surgeon: Curvin Deward MOULD, MD;  Location: [facility];  Service: General;  Laterality: Left;   CARDIAC CATHETERIZATION N/A 11/25/2014   Procedure: Right/Left Heart Cath and Coronary Angiography;  Surgeon: Victory LELON Sharps, MD;  Location: [facility] INVASIVE CV LAB;  Service: Cardiovascular;  Laterality: N/A;   CESAREAN SECTION  1985   COLONOSCOPY  03/2022    CORONARY ANGIOPLASTY WITH STENT PLACEMENT  08/23/2017   CORONARY STENT INTERVENTION N/A 08/23/2017   Procedure: CORONARY STENT INTERVENTION;  Surgeon: Verlin Lonni BIRCH, MD;  Location: [facility] INVASIVE CV LAB;  Service: Cardiovascular;  Laterality: N/A;   HERNIA REPAIR  04/13/2012   VHR laparoscopic   LAPAROSCOPIC BILATERAL SALPINGO OOPHERECTOMY Bilateral 10/18/2022   Procedure: LAPAROTOMY WITH BILATERAL SALPINGO OOPHORECTOMY;  Surgeon: Mat Browning, MD;  Location: [facility] OR;  Service: Gynecology;  Laterality: Bilateral;   LEFT HEART CATH AND CORONARY ANGIOGRAPHY N/A 08/23/2017   Procedure: LEFT HEART CATH AND CORONARY ANGIOGRAPHY;  Surgeon: Verlin Lonni BIRCH, MD;  Location: [facility] INVASIVE CV LAB;  Service: Cardiovascular;  Laterality: N/A;   VAGINAL HYSTERECTOMY N/A 10/18/2022   Procedure: HYSTERECTOMY VAGINAL;  Surgeon: Mat Browning, MD;  Location[facility] OR;  Service: Gynecology;  Laterality: N/A;   VENTRAL HERNIA REPAIR  04/13/2012   Procedure: LAPAROSCOPIC VENTRAL HERNIA;  Surgeon: Elon CHRISTELLA Pacini, MD;  Location: [facility] OR;  Service: General;  Laterality: N/A;  laparoscopic repair of incarcerated hernia     Family History:  Family History  Problem Relation Age of Onset   Breast cancer Mother 1       bilateral breast cancer dx. 51, met to liver   Hypertension Mother    Diabetes Mother    Cancer Mother    Diverticulosis Father    Prostate cancer Father    Breast cancer Sister 70       DCIS at 55, IDC at 34, PALB2+   Cancer Sister        breast cancer, invasive ductal carcinoma in 2022,DCIS at 78 with 4 weeks of radiation, 5 years of Tamoxifen     Depression Sister    Hyperlipidemia Sister    Pulmonary embolism Brother        recurrent   Heart attack Maternal Grandfather    Ovarian cancer Paternal Grandmother    Cerebral palsy Son    Prostate cancer Paternal Uncle    Breast cancer Niece 55       PALB2+   Osteoporosis Niece    Stroke Neg Hx    Colon cancer Neg Hx     Esophageal cancer Neg Hx    Stomach cancer Neg Hx    Rectal cancer Neg Hx     Social History:  Social History   Socioeconomic History   Marital status: Married    Spouse name: Not on file   Number of children: 1   Years of education: 14   Highest education level: Associate degree: academic program  Occupational History   Occupation: Disabled   Tobacco Use   Smoking status: Never    Passive exposure: Never   Smokeless tobacco: Never  Vaping Use   Vaping status: Never Used  Substance and Sexual Activity   Alcohol use: Yes    Alcohol/week: 4.0 - 7.0 standard drinks of alcohol    Types: 4 - 7 Standard drinks or equivalent per week   Drug use: No   Sexual activity: Yes    Birth control/protection: Surgical    Comment: gluten free, lives with husband and son with CP quadriplegia  Other Topics Concern   Not on file  Social History Narrative   Cares for son with cerebral palsy.    Lives at home with her husband and son.   Right-handed.   2 cups caffeine per day.   One story home   Social Drivers of Health   Financial Resource Strain: Low Risk  (01/18/2023)   Overall Financial Resource Strain (CARDIA)    Difficulty of Paying Living Expenses: Not hard at all  Food Insecurity: No Food Insecurity (02/13/2024)   Hunger Vital Sign    Worried About Running Out  of Food in the Last Year: Never true    Ran Out of Food in the Last Year: Never true  Transportation Needs: No Transportation Needs (02/13/2024)   PRAPARE - Administrator, Civil Service (Medical): No    Lack of Transportation (Non-Medical): No  Physical Activity: Inactive (02/13/2024)   Exercise Vital Sign    Days of Exercise per Week: 0 days    Minutes of Exercise per Session: 0 min  Stress: No Stress Concern Present (02/13/2024)   [employer] of Occupational Health - Occupational Stress Questionnaire    Feeling of Stress: Only a little  Social Connections: Socially Integrated (02/13/2024)   Social  Connection and Isolation Panel    Frequency of Communication with Friends and Family: More than three times a week    Frequency of Social Gatherings with Friends and Family: Twice a week    Attends Religious Services: More than 4 times per year    Active Member of [employer] or Organizations: Yes    Attends Engineer, structural: More than 4 times per year    Marital Status: Married    Allergies:  Allergies  Allergen Reactions   Latuda  [Lurasidone  Hcl] Other (See Comments)    PER THE PT, CAUSED RESTLESSNESS   Beclomethasone Dipropionate Hives and Other (See Comments)    Weight gain, also   Flexeril  [Cyclobenzaprine ] Anxiety   Mometasone  Furo-Formoterol Fum Hives and Other (See Comments)    Weight gain, also   Sulfonamide Derivatives Hives and Rash   Statins Other (See Comments)    Myalgias, RLS   Fluticasone  Rash and Other (See Comments)    Weight gain, too    Current Medications: Current Outpatient Medications  Medication Sig Dispense Refill   acetaminophen  (TYLENOL ) 650 MG CR tablet Take 650-1,300 mg by mouth every 8 (eight) hours as needed for pain.     albuterol  (VENTOLIN  HFA) 108 (90 Base) MCG/ACT inhaler Inhale 1-2 puffs into the lungs every 6 (six) hours as needed. 8 g 2   ascorbic acid (VITAMIN C) 500 MG tablet Take 500 mg by mouth every evening.     benralizumab  (FASENRA  PEN) 30 MG/ML prefilled autoinjector Inject 1 mL (30 mg total) into the skin every 8 (eight) weeks. ** AZ&ME PATIENT ASSISTANCE** 1 mL 2   Calcium -Magnesium -Vitamin D  (CALCIUM  MAGNESIUM  PO) Take 1 tablet by mouth at bedtime.     denosumab  (PROLIA ) 60 MG/ML SOSY injection Inject 60 mg into the skin every 6 (six) months.     doxycycline  (VIBRA -TABS) 100 MG tablet Take 1 tablet (100 mg total) by mouth 2 (two) times daily. 20 tablet 0   ELIQUIS  5 MG TABS tablet TAKE 1 TABLET BY MOUTH 2 TIMES DAILY. 60 tablet 4   famotidine  (PEPCID ) 20 MG tablet TAKE 1 TABLET BY MOUTH TWICE A DAY 60 tablet 1    ipratropium-albuterol  (DUONEB) 0.5-2.5 (3) MG/3ML SOLN Take 3 mLs by nebulization every 6 (six) hours as needed. 120 mL 5   nitroGLYCERIN  (NITROSTAT ) 0.4 MG SL tablet Place 1 tablet (0.4 mg total) under the tongue every 5 (five) minutes as needed for chest pain. 25 tablet 11   REPATHA  SURECLICK 140 MG/ML SOAJ INJECT 140 MG INTO THE SKIN EVERY 14 DAYS. 2 mL 11   risperiDONE  (RISPERDAL ) 0.5 MG tablet Take 1 tablet (0.5 mg total) by mouth at bedtime. 30 tablet 2   Semaglutide ,0.25 or 0.5MG /DOS, 2 MG/1.5ML SOPN Inject 0.25 mg into the skin every Friday.     valbenazine (INGREZZA)  40 MG capsule Take 1 capsule (40 mg total) by mouth daily. 30 capsule 2   [START ON 05/10/2024] clonazePAM  (KLONOPIN ) 0.5 MG tablet Take 1 tablet (0.5 mg total) by mouth in the morning. 30 tablet 1   doxepin  (SINEQUAN ) 25 MG capsule Take 1 capsule (25 mg total) by mouth at bedtime. 30 capsule 2   pindolol  (VISKEN ) 5 MG tablet Take 1 tablet (5 mg total) by mouth daily after breakfast. 30 tablet 2   venlafaxine  XR (EFFEXOR -XR) 75 MG 24 hr capsule Take 3 capsules (225 mg total) by mouth daily with breakfast. 90 capsule 2   Current Facility-Administered Medications  Medication Dose Route Frequency Provider Last Rate Last Admin   [START ON 06/09/2024] denosumab  (PROLIA ) injection 60 mg  60 mg Subcutaneous Q6 months Domenica Harlene LABOR, MD         Musculoskeletal: Strength & Muscle Tone: within normal limits Gait & Station: normal Patient leans: N/A  Psychiatric Specialty Exam: Blood pressure 108/70, pulse 80, height 5' 1 (1.549 m), weight 167 lb (75.8 kg).Body mass index is 31.55 kg/m. Review of Systems  General Appearance: Fairly Groomed  Eye Contact:  Good  Speech:  Clear and Coherent  Volume:  Normal  Mood:  Euthymic  Affect:  Appropriate  Thought Content: Logical   Suicidal Thoughts:  No  Homicidal Thoughts:  No  Thought Process:  Coherent  Orientation:  Full (Time, Place, and Person)    Memory: Immediate;    Good  Judgment:  Good  Insight:  Good  Concentration:  Concentration: Good  Recall:  not formally assessed   Fund of Knowledge: Good  Language: Good  Psychomotor Activity:  TD  Akathisia:  No  AIMS (if indicated): done 5 on 03/19/24  Assets:  Communication Skills Desire for Improvement Financial Resources/Insurance Housing Transportation  ADL's:  Intact  Cognition: WNL  Sleep:  Good   Metabolic Disorder Labs: Lab Results  Component Value Date   HGBA1C 5.5 12/12/2023   MPG 117 10/22/2008   Lab Results  Component Value Date   PROLACTIN 15.4 08/12/2022   Lab Results  Component Value Date   CHOL 134 12/12/2023   TRIG 98.0 12/12/2023   HDL 54.70 12/12/2023   CHOLHDL 2 12/12/2023   VLDL 19.6 12/12/2023   LDLCALC 60 12/12/2023   LDLCALC 38 04/25/2023   Lab Results  Component Value Date   TSH 2.29 12/12/2023   TSH 1.79 04/25/2023    Therapeutic Level Labs: No results found for: LITHIUM No results found for: VALPROATE No results found for: CBMZ   Screenings: AIMS    Flowsheet Row [facility] Admission (Discharged) from 08/12/2022 in [facility] BE[facility]AL MEDICINE  AIMS Total Score 5 2   AUDIT    Flowsheet Row Admission (Discharged) from 08/12/2022 in [facility] BEHAVIORAL MEDICINE  Alcohol Use Disorder Identification Test Final Score (AUDIT) 0   GAD-7    Flowsheet Row Video Visit from 10/13/2022 in [facility] at [facility] Office Visit from 06/30/2022 in [facility] Office Visit from 05/26/2022 in [facility] Care at [facility] Office Visit from 01/16/2017 in [facility]  Total GAD-7 Score 0 11 11 3    PHQ2-9    Flowsheet Row Clinical Support from 02/13/2024 in [facility] Care at [facility] Office Visit from 01/11/2024 in [facility] Primary Care at [facility] Office Visit from 01/01/2024 in [facility] Of [place] Clinical Support from 07/12/2023 in [facility]fusion [facility] at [facility] Clinical Support from 02/10/2023 in [facility] Primary Care at [facility]  PHQ-2 Total Score 0 0 0 0 0  PHQ-9 Total Score 0 0 -- -- --   Flowsheet Row ED to Hosp-Admission (Discharged) from 07/28/2023 in [place] LONG 4TH FLOOR PROGRESSIVE CARE AND UROLOGY Admission (Discharged) from 11/04/2022 in [facility] Admission (Discharged) from 10/18/2022 in [place] [facility]S [facility]INE Specialty Care  C-SSRS RISK CATEGORY No Risk No Risk No Risk    Collaboration of Care: Collaboration of Care: Medication Management AEB medication prescription and Other provider involved in patient's care AEB orthopedics chart review  Patient/Guardian was advised Release of Information must be obtained prior to any record release in order to collaborate their care with an outside provider. Patient/Guardian was advised if they have not already done so to contact the registration department to sign all necessary forms in order for us  to release information regarding their care.   Consent: Patient/Guardian gives verbal consent for treatment and assignment of benefits for services provided during this visit. Patient/Guardian expressed understanding and agreed to proceed.    Arvella CHRISTELLA Finder, MD 03/19/2024, 4:34 PM

## 2024-03-19 ENCOUNTER — Ambulatory Visit (HOSPITAL_COMMUNITY): Admitting: Psychiatry

## 2024-03-19 ENCOUNTER — Other Ambulatory Visit: Payer: Self-pay

## 2024-03-19 ENCOUNTER — Encounter (HOSPITAL_COMMUNITY): Payer: Self-pay | Admitting: Psychiatry

## 2024-03-19 VITALS — BP 108/70 | HR 80 | Ht 61.0 in | Wt 167.0 lb

## 2024-03-19 DIAGNOSIS — F419 Anxiety disorder, unspecified: Secondary | ICD-10-CM | POA: Diagnosis not present

## 2024-03-19 DIAGNOSIS — G2401 Drug induced subacute dyskinesia: Secondary | ICD-10-CM

## 2024-03-19 DIAGNOSIS — M19071 Primary osteoarthritis, right ankle and foot: Secondary | ICD-10-CM | POA: Diagnosis not present

## 2024-03-19 DIAGNOSIS — M25571 Pain in right ankle and joints of right foot: Secondary | ICD-10-CM | POA: Diagnosis not present

## 2024-03-19 DIAGNOSIS — M79671 Pain in right foot: Secondary | ICD-10-CM | POA: Diagnosis not present

## 2024-03-19 DIAGNOSIS — F32A Depression, unspecified: Secondary | ICD-10-CM | POA: Diagnosis not present

## 2024-03-19 MED ORDER — VALBENAZINE TOSYLATE 40 MG PO CAPS
40.0000 mg | ORAL_CAPSULE | Freq: Every day | ORAL | 2 refills | Status: DC
Start: 2024-03-19 — End: 2024-04-22

## 2024-03-19 MED ORDER — PINDOLOL 5 MG PO TABS: 5.0000 mg | ORAL_TABLET | Freq: Every day | ORAL | 2 refills | Status: DC

## 2024-03-19 MED ORDER — DOXEPIN HCL 25 MG PO CAPS: 25.0000 mg | ORAL_CAPSULE | Freq: Every day | ORAL | 2 refills | Status: DC

## 2024-03-19 MED ORDER — CLONAZEPAM 0.5 MG PO TABS: 0.5000 mg | ORAL_TABLET | Freq: Every morning | ORAL | 1 refills | Status: DC

## 2024-03-19 MED ORDER — VENLAFAXINE HCL ER 75 MG PO CP24: 225.0000 mg | ORAL_CAPSULE | Freq: Every day | ORAL | 2 refills | Status: DC

## 2024-03-20 ENCOUNTER — Telehealth (HOSPITAL_COMMUNITY): Payer: Self-pay | Admitting: *Deleted

## 2024-03-20 NOTE — Telephone Encounter (Signed)
 PA for Ingrezza 40 mg submitted and Approved from 03/20/24 through the end of the year.

## 2024-03-28 ENCOUNTER — Other Ambulatory Visit: Payer: Self-pay

## 2024-03-28 DIAGNOSIS — I998 Other disorder of circulatory system: Secondary | ICD-10-CM

## 2024-04-03 DIAGNOSIS — Z1589 Genetic susceptibility to other disease: Secondary | ICD-10-CM | POA: Diagnosis not present

## 2024-04-03 DIAGNOSIS — Z1151 Encounter for screening for human papillomavirus (HPV): Secondary | ICD-10-CM | POA: Diagnosis not present

## 2024-04-03 DIAGNOSIS — Z6831 Body mass index (BMI) 31.0-31.9, adult: Secondary | ICD-10-CM | POA: Diagnosis not present

## 2024-04-03 DIAGNOSIS — M810 Age-related osteoporosis without current pathological fracture: Secondary | ICD-10-CM | POA: Diagnosis not present

## 2024-04-03 DIAGNOSIS — Z01419 Encounter for gynecological examination (general) (routine) without abnormal findings: Secondary | ICD-10-CM | POA: Diagnosis not present

## 2024-04-03 DIAGNOSIS — Z1272 Encounter for screening for malignant neoplasm of vagina: Secondary | ICD-10-CM | POA: Diagnosis not present

## 2024-04-04 ENCOUNTER — Ambulatory Visit: Admitting: Internal Medicine

## 2024-04-04 DIAGNOSIS — M545 Low back pain, unspecified: Secondary | ICD-10-CM | POA: Diagnosis not present

## 2024-04-04 DIAGNOSIS — M4120 Other idiopathic scoliosis, site unspecified: Secondary | ICD-10-CM | POA: Diagnosis not present

## 2024-04-04 DIAGNOSIS — M542 Cervicalgia: Secondary | ICD-10-CM | POA: Diagnosis not present

## 2024-04-05 ENCOUNTER — Other Ambulatory Visit: Payer: Self-pay | Admitting: Family

## 2024-04-05 DIAGNOSIS — Z1589 Genetic susceptibility to other disease: Secondary | ICD-10-CM

## 2024-04-05 DIAGNOSIS — M19071 Primary osteoarthritis, right ankle and foot: Secondary | ICD-10-CM | POA: Diagnosis not present

## 2024-04-05 DIAGNOSIS — M7671 Peroneal tendinitis, right leg: Secondary | ICD-10-CM | POA: Diagnosis not present

## 2024-04-10 ENCOUNTER — Ambulatory Visit
Admission: RE | Admit: 2024-04-10 | Discharge: 2024-04-10 | Disposition: A | Source: Ambulatory Visit | Attending: Nurse Practitioner

## 2024-04-10 DIAGNOSIS — C50512 Malignant neoplasm of lower-outer quadrant of left female breast: Secondary | ICD-10-CM

## 2024-04-10 DIAGNOSIS — R928 Other abnormal and inconclusive findings on diagnostic imaging of breast: Secondary | ICD-10-CM | POA: Diagnosis not present

## 2024-04-11 DIAGNOSIS — M545 Low back pain, unspecified: Secondary | ICD-10-CM | POA: Diagnosis not present

## 2024-04-11 DIAGNOSIS — M542 Cervicalgia: Secondary | ICD-10-CM | POA: Diagnosis not present

## 2024-04-11 DIAGNOSIS — M4120 Other idiopathic scoliosis, site unspecified: Secondary | ICD-10-CM | POA: Diagnosis not present

## 2024-04-16 DIAGNOSIS — M19071 Primary osteoarthritis, right ankle and foot: Secondary | ICD-10-CM | POA: Diagnosis not present

## 2024-04-19 NOTE — Progress Notes (Signed)
 Victoria [name]                                          MRN: [id number]   04/19/2024   The [facility] Quality Team Specialist reviewed this patient medical record for the purposes of chart review for care gap closure. The following were reviewed: chart review for care gap closure-kidney health evaluation for diabetes:eGFR  and uACR.    VBCI Quality Team

## 2024-04-22 ENCOUNTER — Other Ambulatory Visit (HOSPITAL_COMMUNITY): Payer: Self-pay

## 2024-04-22 DIAGNOSIS — G2401 Drug induced subacute dyskinesia: Secondary | ICD-10-CM

## 2024-04-22 MED ORDER — VALBENAZINE TOSYLATE 40 MG PO CAPS: 40.0000 mg | ORAL_CAPSULE | Freq: Every day | ORAL | 2 refills | Status: DC

## 2024-04-23 ENCOUNTER — Ambulatory Visit: Admitting: Physician Assistant

## 2024-04-23 ENCOUNTER — Ambulatory Visit (HOSPITAL_COMMUNITY)
Admission: RE | Admit: 2024-04-23 | Discharge: 2024-04-23 | Disposition: A | Discharge status: Home / Self Care | Source: Ambulatory Visit | Attending: Vascular Surgery | Admitting: Vascular Surgery

## 2024-04-23 VITALS — BP 101/68 | HR 77 | Temp 97.7°F | Wt 167.3 lb

## 2024-04-23 DIAGNOSIS — I998 Other disorder of circulatory system: Secondary | ICD-10-CM | POA: Insufficient documentation

## 2024-04-23 NOTE — Progress Notes (Signed)
 Office Note     CC:  follow up Requesting Provider:  Domenica Harlene LABOR, MD  HPI: Victoria [name] is a 68 y.o. (1955/09/04) female who presents for evaluation of right lower extremity edema.  She denies any history of DVT, venous ulcerations, trauma, or prior vascular intervention.  She has noticed an area of pigmentation change as well as indurated skin.  This has been slowly progressing over the past several years.  She does not wear compression or elevate her legs.  She denies tobacco use.  She recently has been in a boot for tendinitis in her foot.   Past Medical History:  Diagnosis Date   Allergic bronchopulmonary aspergillosis (HCC) 2008   sees Dr Katheren Finn pulmonology   Anemia    iron  deficiency, resolved   Anxiety    Asthma    Breast cancer (HCC)    CAD (coronary artery disease)    a. LHC 6/16:  oOM1 60, pRCA 25 >> med Rx b. cath 3/19 2nd OM with 95% stenosis s/p synergy DES & anomalous RCA   CAP (community acquired pneumonia) 2016; 06/07/2016   Chronic bronchitis (HCC)    Chronic lower back pain    Clotting disorder 07/04/2020   maroon blood in stools - 3 episodes in two hours   Complication of anesthesia    think I have a hard time waking up from it   COPD (chronic obstructive pulmonary disease) (HCC)    Depression    mild   Diverticulitis    Diverticulosis    GERD (gastroesophageal reflux disease)    H/O hiatal hernia    Headache    weekly (08/23/2017)   History of echocardiogram    Echo 6/16:  Mod LVH, EF 60-65%, no RWMA, Gr 1 DD, trivial MR, normal LA size.   History of radiation therapy    Left breast- 12/26/22-01/23/23-Dr. Lynwood Nasuti   Hyperglycemia 11/20/2015   Hyperlipidemia, mixed 09/11/2007   Qualifier: Diagnosis of  By: Antonio ROSALEA Rockers   Did not tolerate Lipitor, zocor, Lovastatin, Pravastatin, Livalo , Crestor  even low dose    IBS (irritable bowel syndrome)    Maxillary sinusitis    Normal cardiac stress test 11/2011   No evidence of  ischemia or infarct.   Calculated ejection fraction 72%.   Obesity    OSA (obstructive sleep apnea) 02/2012   has stopped using  cpap   Osteoarthritis    Osteoporosis    Personal history of radiation therapy    Pneumonia 11/2011   before 2013 I hadn't had pneumonia since I was a child (04/13/2012)   Pulmonary nodules    S/P angioplasty with stent 08/23/17 ostial 2nd OM with DES synnergy 08/24/2017   Schatzki's ring     Past Surgical History:  Procedure Laterality Date   ANTERIOR AND POSTERIOR REPAIR N/A 10/18/2022   Procedure: ANTERIOR (CYSTOCELE);  Surgeon: Mat Browning, MD;  Location: [facility] OR;  Service: Gynecology;  Laterality: N/A;   APPENDECTOMY  1989   BREAST BIOPSY  11/03/2022   MM LT RADIOACTIVE SEED LOC MAMMO GUIDE 11/03/2022 [facility] MAMMOGRAPHY   BREAST LUMPECTOMY WITH RADIOACTIVE SEED LOCALIZATION Left 11/04/2022   Procedure: LEFT BREAST LUMPECTOMY WITH RADIOACTIVE SEED LOCALIZATION;  Surgeon: Curvin Deward MOULD, MD;  Location: [facility];  Service: General;  Laterality: Left;   CARDIAC CATHETERIZATION N/A 11/25/2014   Procedure: Right/Left Heart Cath and Coronary Angiography;  Surgeon: Victory LELON Sharps, MD;  Location: [facility] INVASIVE CV LAB;  Service: Cardiovascular;  Laterality: N/A;  CESAREAN SECTION  1985   COLONOSCOPY  03/2022   CORONARY ANGIOPLASTY WITH STENT PLACEMENT  08/23/2017   CORONARY STENT INTERVENTION N/A 08/23/2017   Procedure: CORONARY STENT INTERVENTION;  Surgeon: Verlin Lonni BIRCH, MD;  Location: [facility] INVASIVE CV LAB;  Service: Cardiovascular;  Laterality: N/A;   HERNIA REPAIR  04/13/2012   VHR laparoscopic   LAPAROSCOPIC BILATERAL SALPINGO OOPHERECTOMY Bilateral 10/18/2022   Procedure: LAPAROTOMY WITH BILATERAL SALPINGO OOPHORECTOMY;  Surgeon: Mat Browning, MD;  Location: [facility] OR;  Service: Gynecology;  Laterality: Bilateral;   LEFT HEART CATH AND CORONARY ANGIOGRAPHY N/A 08/23/2017   Procedure: LEFT HEART CATH AND CORONARY ANGIOGRAPHY;   Surgeon: Verlin Lonni BIRCH, MD;  Location: [facility] INVASIVE CV LAB;  Service: Cardiovascular;  Laterality: N/A;   VAGINAL HYSTERECTOMY N/A 10/18/2022   Procedure: HYSTERECTOMY VAGINAL;  Surgeon: Mat Browning, MD;  Location: [facility] OR;  Service: Gynecology;  Laterality: N/A;   VENTRAL HERNIA REPAIR  04/13/2012   Procedure: LAPAROSCOPIC VENTRAL HERNIA;  Surgeon: Elon CHRISTELLA Pacini, MD;  Location: [facility] OR;  Service: General;  Laterality: N/A;  laparoscopic repair of incarcerated hernia    Social History   Socioeconomic History   Marital status: Married    Spouse name: Not on file   Number of children: 1   Years of education: 14   Highest education level: Associate degree: academic program  Occupational History   Occupation: Disabled   Tobacco Use   Smoking status: Never    Passive exposure: Never   Smokeless tobacco: Never  Vaping Use   Vaping status: Never Used  Substance and Sexual Activity   Alcohol use: Yes    Alcohol/week: 4.0 - 7.0 standard drinks of alcohol    Types: 4 - 7 Standard drinks or equivalent per week   Drug use: No   Sexual activity: Yes    Birth control/protection: Surgical    Comment: gluten free, lives with husband and son with CP quadriplegia  Other Topics Concern   Not on file  Social History Narrative   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    Lives at home with her husband and son.   Right-handed.   2 cups caffeine per day.   One story home   Social Drivers of Health   Financial Resource Strain: Low Risk  (01/18/2023)   Overall Financial Resource Strain (CARDIA)    Difficulty of Paying Living Expenses: Not hard at all  Food Insecurity: No Food Insecurity (02/13/2024)   Hunger Vital Sign    Worried About Running Out of Food in the Last Year: Never true    Ran Out of Food in the Last Year: Never true  Transportation Needs: No Transportation Needs (02/13/2024)   PRAPARE - Administrator, Civil Service (Medical): No    Lack of Transportation  (Non-Medical): No  Physical Activity: Inactive (02/13/2024)   Exercise Vital Sign    Days of Exercise per Week: 0 days    Minutes of Exercise per Session: 0 min  Stress: No Stress Concern Present (02/13/2024)   [employer] of Occupational Health - Occupational Stress Questionnaire    Feeling of Stress: Only a little  Social Connections: Socially Integrated (02/13/2024)   Social Connection and Isolation Panel    Frequency of Communication with Friends and Family: More than three times a week    Frequency of Social Gatherings with Friends and Family: Twice a week    Attends Religious Services: More than 4 times per year    Active Member of  Clubs or Organizations: Yes    Attends Banker Meetings: More than 4 times per year    Marital Status: Married  Catering Manager Violence: Not At Risk (02/13/2024)   Humiliation, Afraid, Rape, and Kick questionnaire    Fear of Current or Ex-Partner: No    Emotionally Abused: No    Physically Abused: No    Sexually Abused: No    Family History  Problem Relation Age of Onset   Breast cancer Mother 57       bilateral breast cancer dx. 76, met to liver   Hypertension Mother    Diabetes Mother    Cancer Mother    Diverticulosis Father    Prostate cancer Father    Breast cancer Sister 42       DCIS at 55, IDC at 46, PALB2+   Cancer Sister        breast cancer, invasive ductal carcinoma in 2022,DCIS at 36 with 4 weeks of radiation, 5 years of Tamoxifen     Depression Sister    Hyperlipidemia Sister    Pulmonary embolism Brother        recurrent   Heart attack Maternal Grandfather    Ovarian cancer Paternal Grandmother    Cerebral palsy Son    Prostate cancer Paternal Uncle    Breast cancer Niece 79       PALB2+   Osteoporosis Niece    Stroke Neg Hx    Colon cancer Neg Hx    Esophageal cancer Neg Hx    Stomach cancer Neg Hx    Rectal cancer Neg Hx     Current Outpatient Medications  Medication Sig Dispense Refill    acetaminophen  (TYLENOL ) 650 MG CR tablet Take 650-1,300 mg by mouth every 8 (eight) hours as needed for pain.     albuterol  (VENTOLIN  HFA) 108 (90 Base) MCG/ACT inhaler Inhale 1-2 puffs into the lungs every 6 (six) hours as needed. 8 g 2   ascorbic acid (VITAMIN C) 500 MG tablet Take 500 mg by mouth every evening.     benralizumab  (FASENRA  PEN) 30 MG/ML prefilled autoinjector Inject 1 mL (30 mg total) into the skin every 8 (eight) weeks. ** AZ&ME PATIENT ASSISTANCE** 1 mL 2   Calcium -Magnesium -Vitamin D  (CALCIUM  MAGNESIUM  PO) Take 1 tablet by mouth at bedtime.     [START ON 05/10/2024] clonazePAM  (KLONOPIN ) 0.5 MG tablet Take 1 tablet (0.5 mg total) by mouth in the morning. 30 tablet 1   denosumab  (PROLIA ) 60 MG/ML SOSY injection Inject 60 mg into the skin every 6 (six) months.     doxepin  (SINEQUAN ) 25 MG capsule Take 1 capsule (25 mg total) by mouth at bedtime. 30 capsule 2   doxycycline  (VIBRA -TABS) 100 MG tablet Take 1 tablet (100 mg total) by mouth 2 (two) times daily. 20 tablet 0   ELIQUIS  5 MG TABS tablet TAKE 1 TABLET BY MOUTH 2 TIMES DAILY. 60 tablet 4   famotidine  (PEPCID ) 20 MG tablet TAKE 1 TABLET BY MOUTH TWICE A DAY 60 tablet 1   ipratropium-albuterol  (DUONEB) 0.5-2.5 (3) MG/3ML SOLN Take 3 mLs by nebulization every 6 (six) hours as needed. 120 mL 5   nitroGLYCERIN  (NITROSTAT ) 0.4 MG SL tablet Place 1 tablet (0.4 mg total) under the tongue every 5 (five) minutes as needed for chest pain. 25 tablet 11   pindolol  (VISKEN ) 5 MG tablet Take 1 tablet (5 mg total) by mouth daily after breakfast. 30 tablet 2   REPATHA  SURECLICK 140 MG/ML SOAJ INJECT  140 MG INTO THE SKIN EVERY 14 DAYS. 2 mL 11   risperiDONE  (RISPERDAL ) 0.5 MG tablet Take 1 tablet (0.5 mg total) by mouth at bedtime. 30 tablet 2   Semaglutide ,0.25 or 0.5MG /DOS, 2 MG/1.5ML SOPN Inject 0.25 mg into the skin every Friday.     valbenazine (INGREZZA) 40 MG capsule Take 1 capsule (40 mg total) by mouth daily. 30 capsule 2    venlafaxine  XR (EFFEXOR -XR) 75 MG 24 hr capsule Take 3 capsules (225 mg total) by mouth daily with breakfast. 90 capsule 2   Current Facility-Administered Medications  Medication Dose Route Frequency Provider Last Rate Last Admin   [START ON 06/09/2024] denosumab  (PROLIA ) injection 60 mg  60 mg Subcutaneous Q6 months Domenica Harlene LABOR, MD        Allergies  Allergen Reactions   Latuda  [Lurasidone  Hcl] Other (See Comments)    PER THE PT, CAUSED RESTLESSNESS   Beclomethasone Dipropionate Hives and Other (See Comments)    Weight gain, also   Flexeril  [Cyclobenzaprine ] Anxiety   Mometasone  Furo-Formoterol Fum Hives and Other (See Comments)    Weight gain, also   Sulfonamide Derivatives Hives and Rash   Statins Other (See Comments)    Myalgias, RLS   Fluticasone  Rash and Other (See Comments)    Weight gain, too     REVIEW OF SYSTEMS:  Negative unless noted in HPI [X]  denotes positive finding, [ ]  denotes negative finding Cardiac  Comments:  Chest pain or chest pressure:    Shortness of breath upon exertion:    Short of breath when lying flat:    Irregular heart rhythm:        Vascular    Pain in calf, thigh, or hip brought on by ambulation:    Pain in feet at night that wakes you up from your sleep:     Blood clot in your veins:    Leg swelling:         Pulmonary    Oxygen  at home:    Productive cough:     Wheezing:         Neurologic    Sudden weakness in arms or legs:     Sudden numbness in arms or legs:     Sudden onset of difficulty speaking or slurred speech:    Temporary loss of vision in one eye:     Problems with dizziness:         Gastrointestinal    Blood in stool:     Vomited blood:         Genitourinary    Burning when urinating:     Blood in urine:        Psychiatric    Major depression:         Hematologic    Bleeding problems:    Problems with blood clotting too easily:        Skin    Rashes or ulcers:        Constitutional    Fever or  chills:      PHYSICAL EXAMINATION:  Vitals:   04/23/24 1320  BP: 101/68  Pulse: 77  Temp: 97.7 F (36.5 C)  TempSrc: Temporal  Weight: 167 lb 4.8 oz (75.9 kg)    General:  WDWN in NAD; vital signs documented above Gait: Not observed HENT: WNL, normocephalic Pulmonary: normal non-labored breathing Cardiac: regular HR Abdomen: soft, NT, no masses Skin: without rashes Vascular Exam/Pulses: Palpable and symmetrical PT pulses Extremities: No weeping skin or open wounds;  indurated skin both medial lower ankles with stasis pigmentation changes Musculoskeletal: no muscle wasting or atrophy  Neurologic: A&O X 3 Psychiatric:  The pt has Normal affect.   Non-Invasive Vascular Imaging:   Right lower extremity venous reflux study negative for DVT Negative for any significant deep venous reflux Negative for any superficial venous reflux    ASSESSMENT/PLAN:: 68 y.o. female here for evaluation of right lower extremity edema  Victoria [name] is a 68 year old female who presents to clinic for evaluation of right lower extremity edema and skin changes of her medial ankle.  She reports pigmentation changes and indurated skin in this area which has been progressing over the past several months to years.  She has no history of DVT or venous ulcers.  Right lower extremity venous reflux study is negative for DVT.  She also was negative for any deep or superficial venous reflux.  On exam this appears to be stasis pigmentation and skin induration related to chronic swelling.  Recommendations included regular use of knee-high compression, proper leg elevation periodically during the day, and avoidance of prolonged sitting and standing.  She can return to office if she develops any wounds or if swelling significantly worsens.  Patient will otherwise follow-up on as-needed basis.   Donnice Sender, PA-C [facility] [phone]  Clinic MD:   [place]

## 2024-04-24 ENCOUNTER — Encounter (HOSPITAL_COMMUNITY): Payer: Self-pay

## 2024-04-29 ENCOUNTER — Ambulatory Visit: Admitting: Nurse Practitioner

## 2024-04-29 ENCOUNTER — Encounter: Payer: Self-pay | Admitting: Nurse Practitioner

## 2024-04-29 VITALS — BP 100/60 | HR 60 | Temp 99.0°F | Ht 61.0 in | Wt 165.8 lb

## 2024-04-29 DIAGNOSIS — J45909 Unspecified asthma, uncomplicated: Secondary | ICD-10-CM

## 2024-04-29 DIAGNOSIS — Z23 Encounter for immunization: Secondary | ICD-10-CM | POA: Diagnosis not present

## 2024-04-29 DIAGNOSIS — R059 Cough, unspecified: Secondary | ICD-10-CM

## 2024-04-29 DIAGNOSIS — J4550 Severe persistent asthma, uncomplicated: Secondary | ICD-10-CM

## 2024-04-29 DIAGNOSIS — B4481 Allergic bronchopulmonary aspergillosis: Secondary | ICD-10-CM

## 2024-04-29 DIAGNOSIS — Z86711 Personal history of pulmonary embolism: Secondary | ICD-10-CM | POA: Diagnosis not present

## 2024-04-29 DIAGNOSIS — R053 Chronic cough: Secondary | ICD-10-CM

## 2024-04-29 DIAGNOSIS — I2699 Other pulmonary embolism without acute cor pulmonale: Secondary | ICD-10-CM

## 2024-04-29 NOTE — Patient Instructions (Signed)
 Continue Albuterol  inhaler 2 puffs or 3 mL neb every 6 hours as needed for shortness of breath or wheezing. Notify if symptoms persist despite rescue inhaler/neb use.  Continue Fasenra  injections as scheduled Continue Eliquis  Twice daily until we see you back. We may end up discontinuing this after you have been on it for a year but we will plan to discuss/evaluate when you return in [place]. Do not stop it before then. Notify of any excessive bruising. Seek further evaluation for abnormal bleeding, head injury, if you're in an accident, unexplained severe headaches  Discussed that it is not recommended to only use Fasenra  for maintenance therapy of asthma; however, you are doing very well on this and have not had any flare ups so you will continue your regimen as prescribed by Dr. Geronimo.   Flu shot today  Asthma action plan: START nebs up to four times a day for worsening shortness of breath, wheezing and cough. If you symptoms do not improve in 24-48 hours, contact us  for steroid course. If your symptoms rapidly worsen, you have trouble talking or extreme difficulty breathing, or you're not getting relief from your nebulizer/rescue, go to the emergency department.  Follow up in February with Dr. Geronimo. If symptoms do not improve or worsen, please contact office for sooner follow up or seek emergency care.

## 2024-04-29 NOTE — Progress Notes (Signed)
 @Patient  ID: [name], female    DOB: February 03, 1956, 68 y.o.   MRN: [id number]  Chief Complaint  Patient presents with   Asthma    Follow up MR patient    Referring provider: Domenica Harlene LABOR, Victoria [name]  HPI: 68 year old female, never smoker followed for severe asthma, ABPA, PE. She is a patient of Victoria [name] and last seen in office 10/30/2023. Past medical history significant for CAD, CHF, GERD, hiatal hernia, Cushing's, statin myopathy, hx of breast cancer, hx of SI, depression, HLD.   TEST/EVENTS:  10/19/2017 PFT: FVC 86, FEV1 75, ratio 70, TLC 103, DLCO 90. No BD 07/28/2023 CTA chest: heart mildly enlarged. Atherosclerosis/CAD. PA in right PA. Pulmonary emboli in lobar, segmental and subsegmental arteries RL and RML. Large hiatal hernia. Scarring at apices. Strandy atelectasis. Btx in LUL. Multiple nodules, <6 mm, unchanged since 2017.   10/30/2023: OV with Dr. Geronimo. Severe eosinophilic asthma with ABPA. Started on Fasenra  2019. Not on baseline Asmanex , Spiriva , singulair  or prednisone  anymore. PE 07/2023 and started on Eliquis ; provoked due to Tamoxifen . Tolerating well. Uses rescue once a week. Continue Eliquis  for 12 months then reassess d dimer at follow up and decide on low dose protocol.   04/29/2024: Today - follow up Discussed the use of AI scribe software for clinical note transcription with the patient, who gave verbal consent to proceed.  History of Present Illness Victoria [name] is a 68 year old female with asthma who presents for follow-up of her respiratory condition.  Her breathing has been stable with no exacerbations requiring steroids or antibiotics since her last visit. She uses her rescue inhaler once a week or sometimes once every couple of weeks. She is on Fasenra  injections and has not been on a daily inhaler  in quite some time.   She is also on Eliquis  for a blood clot. No bruising or bleeding. No missed doses.   She experiences an occasional cough but it is  not bothersome. No wheezing, fever, or hemoptysis.    Allergies  Allergen Reactions   Latuda  [Lurasidone  Hcl] Other (See Comments)    PER THE PT, CAUSED RESTLESSNESS   Beclomethasone Dipropionate Hives and Other (See Comments)    Weight gain, also   Flexeril  [Cyclobenzaprine ] Anxiety   Mometasone  Furo-Formoterol Fum Hives and Other (See Comments)    Weight gain, also   Sulfonamide Derivatives Hives and Rash   Statins Other (See Comments)    Myalgias, RLS   Sulfamethoxazole-Trimethoprim     Other Reaction(s): Not available   Tamoxifen  Other (See Comments)    Other Reaction(s): Not available  tamoxifen    Fluticasone  Rash and Other (See Comments)    Weight gain, too    Immunization History  Administered Date(s) Administered   INFLUENZA, HIGH DOSE SEASONAL PF 05/05/2015, 04/29/2024   Influenza Split 04/20/2011, 04/20/2020   Influenza Whole 06/06/2007, 04/15/2008, 04/02/2009, 03/29/2012   Influenza,inj,Quad PF,6+ Mos 05/09/2013, 03/03/2014, 05/03/2016, 04/21/2017, 04/02/2018, 03/19/2019, 04/20/2020, 05/03/2021, 03/07/2022   Influenza-Unspecified 04/17/2023   Moderna Sars-Covid-2 Vaccination 08/15/2019, 09/17/2019, 05/16/2020, 12/04/2020   PNEUMOCOCCAL CONJUGATE-20 04/25/2023   Pfizer Covid-19 Vaccine Bivalent Booster 68yrs & up 04/16/2021   Pneumococcal Conjugate-13 05/09/2013   Pneumococcal Polysaccharide-23 05/04/2005   Td 07/29/2009   Tdap 03/11/2015   Zoster Recombinant(Shingrix) 05/09/2019, 08/01/2019    Past Medical History:  Diagnosis Date   Allergic bronchopulmonary aspergillosis (HCC) 2008   sees Dr Katheren [name] pulmonology   Anemia    iron  deficiency, resolved   Anxiety  Asthma    Breast cancer (HCC)    CAD (coronary artery disease)    a. LHC 6/16:  oOM1 60, pRCA 25 >> med Rx b. cath 3/19 2nd OM with 95% stenosis s/p synergy DES & anomalous RCA   CAP (community acquired pneumonia) 2016; 06/07/2016   Chronic bronchitis (HCC)    Chronic lower back  pain    Clotting disorder 07/04/2020   maroon blood in stools - 3 episodes in two hours   Complication of anesthesia    think I have a hard time waking up from it   COPD (chronic obstructive pulmonary disease) (HCC)    Depression    mild   Diverticulitis    Diverticulosis    GERD (gastroesophageal reflux disease)    H/O hiatal hernia    Headache    weekly (08/23/2017)   History of echocardiogram    Echo 6/16:  Mod LVH, EF 60-65%, no RWMA, Gr 1 DD, trivial MR, normal LA size.   History of radiation therapy    Left breast- 12/26/22-01/23/23-Dr. Lynwood [name]   Hyperglycemia 11/20/2015   Hyperlipidemia, mixed 09/11/2007   Qualifier: Diagnosis of  By: Victoria [name]   Did not tolerate Lipitor, zocor, Lovastatin, Pravastatin, Livalo , Crestor  even low dose    IBS (irritable bowel syndrome)    Maxillary sinusitis    Normal cardiac stress test 11/2011   No evidence of ischemia or infarct.   Calculated ejection fraction 72%.   Obesity    OSA (obstructive sleep apnea) 02/2012   has stopped using  cpap   Osteoarthritis    Osteoporosis    Personal history of radiation therapy    Pneumonia 11/2011   before 2013 I hadn't had pneumonia since I was a child (04/13/2012)   Pulmonary nodules    S/P angioplasty with stent 08/23/17 ostial 2nd OM with DES synnergy 08/24/2017   Schatzki's ring     Tobacco History: Social History   Tobacco Use  Smoking Status Never   Passive exposure: Never  Smokeless Tobacco Never   Counseling given: Not Answered   Outpatient Medications Prior to Visit  Medication Sig Dispense Refill   acetaminophen  (TYLENOL ) 650 MG CR tablet Take 650-1,300 mg by mouth every 8 (eight) hours as needed for pain.     albuterol  (VENTOLIN  HFA) 108 (90 Base) MCG/ACT inhaler Inhale 1-2 puffs into the lungs every 6 (six) hours as needed. 8 g 2   ascorbic acid (VITAMIN C) 500 MG tablet Take 500 mg by mouth every evening.     benralizumab  (FASENRA  PEN) 30 MG/ML prefilled  autoinjector Inject 1 mL (30 mg total) into the skin every 8 (eight) weeks. ** [employer] PATIENT ASSISTANCE** 1 mL 2   Calcium -Magnesium -Vitamin D  (CALCIUM  MAGNESIUM  PO) Take 1 tablet by mouth at bedtime.     [START ON 05/10/2024] clonazePAM  (KLONOPIN ) 0.5 MG tablet Take 1 tablet (0.5 mg total) by mouth in the morning. 30 tablet 1   denosumab  (PROLIA ) 60 MG/ML SOSY injection Inject 60 mg into the skin every 6 (six) months.     doxepin  (SINEQUAN ) 25 MG capsule Take 1 capsule (25 mg total) by mouth at bedtime. 30 capsule 2   ELIQUIS  5 MG TABS tablet TAKE 1 TABLET BY MOUTH 2 TIMES DAILY. 60 tablet 4   famotidine  (PEPCID ) 20 MG tablet TAKE 1 TABLET BY MOUTH TWICE A DAY 60 tablet 1   ipratropium-albuterol  (DUONEB) 0.5-2.5 (3) MG/3ML SOLN Take 3 mLs by nebulization every 6 (six) hours as needed. 120  mL 5   nitroGLYCERIN  (NITROSTAT ) 0.4 MG SL tablet Place 1 tablet (0.4 mg total) under the tongue every 5 (five) minutes as needed for chest pain. 25 tablet 11   pindolol  (VISKEN ) 5 MG tablet Take 1 tablet (5 mg total) by mouth daily after breakfast. 30 tablet 2   REPATHA  SURECLICK 140 MG/ML SOAJ INJECT 140 MG INTO THE SKIN EVERY 14 DAYS. 2 mL 11   risperiDONE  (RISPERDAL ) 0.5 MG tablet Take 1 tablet (0.5 mg total) by mouth at bedtime. 30 tablet 2   valbenazine (INGREZZA) 40 MG capsule Take 1 capsule (40 mg total) by mouth daily. 30 capsule 2   venlafaxine  XR (EFFEXOR -XR) 75 MG 24 hr capsule Take 3 capsules (225 mg total) by mouth daily with breakfast. 90 capsule 2   Semaglutide ,0.25 or 0.5MG /DOS, 2 MG/1.5ML SOPN Inject 0.25 mg into the skin every Friday.     doxycycline  (VIBRA -TABS) 100 MG tablet Take 1 tablet (100 mg total) by mouth 2 (two) times daily. 20 tablet 0   Facility-Administered Medications Prior to Visit  Medication Dose Route Frequency Provider Last Rate Last Admin   [START ON 06/09/2024] denosumab  (PROLIA ) injection 60 mg  60 mg Subcutaneous Q6 months Victoria Harlene LABOR, Victoria [name]         Review of  Systems: as above    Physical Exam:  BP 100/60   Pulse 60   Temp 99 F (37.2 C) (Oral)   Ht 5' 1 (1.549 m)   Wt 165 lb 12.8 oz (75.2 kg)   SpO2 100%   BMI 31.33 kg/m   GEN: Pleasant, interactive, well-appearing; obese; in no acute distress HEENT:  Normocephalic and atraumatic. PERRLA. Sclera white. Nasal turbinates pink, moist and patent bilaterally. No rhinorrhea present. Oropharynx pink and moist, without exudate or edema. No lesions, ulcerations, or postnasal drip.  NECK:  Supple w/ fair ROM. No lymphadenopathy.   CV: RRR, no m/r/g PULMONARY:  Unlabored, regular breathing. Clear bilaterally A&P w/o wheezes/rales/rhonchi. No accessory muscle use.  GI: BS present and normoactive. Soft, non-tender to palpation.  MSK: No erythema, warmth or tenderness. Cap refil <2 sec all extrem.  Neuro: A/Ox3. No focal deficits noted.   Skin: Warm, no lesions or rashe Psych: Normal affect and behavior. Judgement and thought content appropriate.     Lab Results:  CBC    Component Value Date/Time   WBC 4.3 01/01/2024 1122   WBC 4.4 12/12/2023 1013   RBC 4.13 01/01/2024 1122   HGB 11.5 (L) 01/01/2024 1122   HGB 12.9 09/23/2021 1321   HCT 35.4 (L) 01/01/2024 1122   HCT 38.7 09/23/2021 1321   PLT 288 01/01/2024 1122   PLT 303 09/23/2021 1321   MCV 85.7 01/01/2024 1122   MCV 90 09/23/2021 1321   MCH 27.8 01/01/2024 1122   MCHC 32.5 01/01/2024 1122   RDW 14.8 01/01/2024 1122   RDW 13.1 09/23/2021 1321   LYMPHSABS 0.8 01/01/2024 1122   LYMPHSABS 1.3 09/23/2021 1321   MONOABS 0.4 01/01/2024 1122   EOSABS 0.0 01/01/2024 1122   EOSABS 0.0 09/23/2021 1321   BASOSABS 0.0 01/01/2024 1122   BASOSABS 0.0 09/23/2021 1321    BMET    Component Value Date/Time   NA 135 01/01/2024 1122   NA 135 09/23/2021 1321   K 4.3 01/01/2024 1122   CL 102 01/01/2024 1122   CO2 29 01/01/2024 1122   GLUCOSE 88 01/01/2024 1122   BUN 15 01/01/2024 1122   BUN 21 09/23/2021 1321   CREATININE 0.68  01/01/2024 1122   CREATININE 0.59 08/17/2023 1137   CALCIUM  9.1 01/01/2024 1122   GFRNONAA >60 01/01/2024 1122   GFRAA >60 08/24/2017 0349    BNP    Component Value Date/Time   BNP 26.2 07/28/2023 2112     Imaging:  VAS US  LOWER EXTREMITY VENOUS REFLUX Result Date: 04/23/2024  Lower Venous Reflux Study Patient Name:  Victoria [name]  Date of Exam:   04/23/2024 Medical Rec #: [id number]      Accession #:    [id number] Date of Birth: [birth year]     Patient Gender: F Patient Age:   64 years Exam Location:  [address] Procedure:      VAS US  LOWER EXTREMITY VENOUS REFLUX Referring Phys: Victoria [name] --------------------------------------------------------------------------------  Indications: Brawny discoloration changes to the right lower extremity.  Performing Technologist: Victoria [name] RVS, RCS  Examination Guidelines: A complete evaluation includes B-mode imaging, spectral Doppler, color Doppler, and power Doppler as needed of all accessible portions of each vessel. Bilateral testing is considered an integral part of a complete examination. Limited examinations for reoccurring indications may be performed as noted. The reflux portion of the exam is performed with the patient in reverse Trendelenburg. Significant venous reflux is defined as >500 ms in the superficial venous system, and >1 second in the deep venous system.  Venous Reflux Times +----------------------+---------+------+-----------+------------+--------+ RIGHT                 Reflux NoRefluxReflux TimeDiameter cmsComments                                 Yes                                  +----------------------+---------+------+-----------+------------+--------+ CFV                   no                                             +----------------------+---------+------+-----------+------------+--------+ FV mid                no                                              +----------------------+---------+------+-----------+------------+--------+ Popliteal             no                                             +----------------------+---------+------+-----------+------------+--------+ GSV at [facility]            no                            0.52             +----------------------+---------+------+-----------+------------+--------+ GSV prox thigh        no  0.30             +----------------------+---------+------+-----------+------------+--------+ GSV mid thigh         no                            0.30             +----------------------+---------+------+-----------+------------+--------+ GSV dist thigh        no                            0.13             +----------------------+---------+------+-----------+------------+--------+ GSV at knee           no                            0.34             +----------------------+---------+------+-----------+------------+--------+ GSV prox calf         no                            0.22             +----------------------+---------+------+-----------+------------+--------+ SSV at [facility]            no                            0.12             +----------------------+---------+------+-----------+------------+--------+ SSV prox calf         no                            0.20             +----------------------+---------+------+-----------+------------+--------+ Perforator distal calf                              0.39             +----------------------+---------+------+-----------+------------+--------+   Summary: Right: - No evidence of deep vein thrombosis from the common femoral through the popliteal veins. - No evidence of superficial venous thrombosis. - The deep venous system is competent. - The great and small saphenous veins are competent. - Perforator present in the distal calf at site of discoloration measuring 0.39 cm.  *See table(s) above  for measurements and observations. Electronically signed by Victoria [name] on 04/23/2024 at 4:48:32 PM.    Final    MM 3D DIAGNOSTIC MAMMOGRAM BILATERAL BREAST Result Date: 04/10/2024 CLINICAL DATA:  68 year old woman status post LEFT malignant lumpectomy in May 2024 presents for annual BILATERAL mammogram. EXAM: DIGITAL DIAGNOSTIC BILATERAL MAMMOGRAM WITH TOMOSYNTHESIS AND CAD TECHNIQUE: Bilateral digital diagnostic mammography and breast tomosynthesis was performed. The images were evaluated with computer-aided detection. COMPARISON:  Previous exam(s). ACR Breast Density Category b: There are scattered areas of fibroglandular density. FINDINGS: RIGHT: Mammogram: No suspicious mass, distortion, or microcalcifications are identified to suggest presence of malignancy. LEFT: Mammogram: Postlumpectomy changes seen in the upper outer LEFT breast. No suspicious mass, distortion, or microcalcifications are identified to suggest presence of malignancy. IMPRESSION: No evidence of RIGHT or LEFT breast malignancy. RECOMMENDATION: Screening mammogram in one year.(Code:SM-B-01Y) I have discussed the findings and recommendations with the patient. If applicable,  a reminder letter will be sent to the patient regarding the next appointment. BI-RADS CATEGORY  2: Benign. Electronically Signed   By: Victoria [name] M.D.   On: 04/10/2024 11:27    Administration History     None          Latest Ref Rng & Units 10/19/2017   10:47 AM  PFT Results  FVC-Pre L 2.65   FVC-Predicted Pre % 86   FVC-Post L 2.57   FVC-Predicted Post % 84   Pre FEV1/FVC % % 67   Post FEV1/FCV % % 70   FEV1-Pre L 1.79   FEV1-Predicted Pre % 75   FEV1-Post L 1.80   DLCO uncorrected ml/min/mmHg 19.51   DLCO UNC% % 90   DLVA Predicted % 104   TLC L 4.90   TLC % Predicted % 103   RV % Predicted % 118     Lab Results  Component Value Date   NITRICOXIDE 42 04/21/2017        Assessment & Plan:   No problem-specific Assessment & Plan  notes found for this encounter. Assessment and Plan Assessment & Plan Asthma/ABPA Well-controlled with no recent exacerbations requiring steroids or antibiotics. Rescue inhaler use is infrequent, approximately once a week or every couple of weeks. Continues on Fasenra  injections, which are effectively managing her asthma. No maintenance inhaler; aware this is not the standard approved therapy. Action plan in place. Trigger prevention reviewed.  - Continue Fasenra  injections. - Continue Albuterol  PRN - Trigger prevention   History of pulmonary embolism Currently on Eliquis  for anticoagulation following a pulmonary embolism occurring while on tamoxifen . Plan to continue Eliquis  until at least February, with a follow-up appointment to reassess the need for continued anticoagulation. - Continue Eliquis  until at least February. - Scheduled follow-up appointment in February to reassess anticoagulation therapy.  Cough Intermittent cough, not bothersome. No wheezing, fever, or hemoptysis reported. Asthma well controlled. Continue to monitor     Advised if symptoms do not improve or worsen, to please contact office for sooner follow up or seek emergency care.   I spent 25 minutes of dedicated to the care of this patient on the date of this encounter to include pre-visit review of records, face-to-face time with the patient discussing conditions above, post visit ordering of testing, clinical documentation with the electronic health record, making appropriate referrals as documented, and communicating necessary findings to members of the patients care team.  Comer LULLA Rouleau, NP 04/29/2024  Pt aware and understands NP's role.

## 2024-05-01 DIAGNOSIS — M542 Cervicalgia: Secondary | ICD-10-CM | POA: Diagnosis not present

## 2024-05-01 DIAGNOSIS — M545 Low back pain, unspecified: Secondary | ICD-10-CM | POA: Diagnosis not present

## 2024-05-01 DIAGNOSIS — M4120 Other idiopathic scoliosis, site unspecified: Secondary | ICD-10-CM | POA: Diagnosis not present

## 2024-05-02 DIAGNOSIS — M7671 Peroneal tendinitis, right leg: Secondary | ICD-10-CM | POA: Diagnosis not present

## 2024-05-02 DIAGNOSIS — M19071 Primary osteoarthritis, right ankle and foot: Secondary | ICD-10-CM | POA: Diagnosis not present

## 2024-05-02 DIAGNOSIS — M79671 Pain in right foot: Secondary | ICD-10-CM | POA: Diagnosis not present

## 2024-05-03 ENCOUNTER — Other Ambulatory Visit: Payer: Self-pay | Admitting: Internal Medicine

## 2024-05-10 ENCOUNTER — Telehealth: Payer: Self-pay

## 2024-05-10 DIAGNOSIS — J4550 Severe persistent asthma, uncomplicated: Secondary | ICD-10-CM

## 2024-05-10 NOTE — Telephone Encounter (Signed)
 Patient enrolled into asthma grant through [employer] Amount: $2000 Award Period: 11/12/2023 - 05/10/2025 ID: [id number] BIN: [id number] PCN: [id number] Group: [id number] For pharmacy inquiries, contact PDMI at [phone]. For patient inquiries, contact PAF at [phone].  MyChart message sent ot patient. Patient can fill with [facility] after 06/20/2024. She should continue filling Fasenra  through [employer] through end of 2025 calendar year

## 2024-05-13 ENCOUNTER — Telehealth: Payer: Self-pay

## 2024-05-13 NOTE — Telephone Encounter (Signed)
 Prolia  VOB initiated via [email or web address]  Next Prolia  inj DUE: 06/12/24

## 2024-05-14 ENCOUNTER — Other Ambulatory Visit (HOSPITAL_COMMUNITY): Payer: Self-pay

## 2024-05-14 NOTE — Telephone Encounter (Signed)
 Pt ready for scheduling for PROLIA  on or after : 06/12/24  Option# 1: Buy/Bill (Office supplied medication)  Out-of-pocket cost due at time of clinic visit: $357  Number of injection/visits approved: ---  Primary: [employer] Prolia  co-insurance: 20% Admin fee co-insurance: 20%  Secondary: --- Prolia  co-insurance:  Admin fee co-insurance:   Medical Benefit Details: Date Benefits were checked: 05/14/24 Deductible: NO/ Coinsurance: 20%/ Admin Fee: 20%  Prior Auth: N/A PA# Expiration Date:   # of doses approved: ----------------------------------------------------------------------- Option# 2- Med Obtained from pharmacy: Prolia  is no longer preferred for pharmacy benefit. Jubbonti is now preferred. PRICING IS FOR JUBBONTI  Pharmacy benefit: Copay $0 (Paid to pharmacy) Admin Fee: 20% (Pay at clinic)  Prior Auth: N/A PA# Expiration Date:   # of doses approved:   If patient wants fill through the pharmacy benefit please send prescription to: [facility], and include estimated need by date in rx notes. Pharmacy will ship medication directly to the office.  Patient NOT eligible for Prolia  Copay Card. Copay Card can make patient's cost as little as $25. Link to apply: [email or web address]://[email or web address]  ** This summary of benefits is an estimation of the patient's out-of-pocket cost. Exact cost may very based on individual plan coverage.

## 2024-05-14 NOTE — Telephone Encounter (Signed)
 SABRA

## 2024-05-20 ENCOUNTER — Other Ambulatory Visit: Payer: Self-pay

## 2024-05-20 ENCOUNTER — Other Ambulatory Visit: Payer: Self-pay | Admitting: *Deleted

## 2024-05-20 DIAGNOSIS — M810 Age-related osteoporosis without current pathological fracture: Secondary | ICD-10-CM

## 2024-05-20 MED ORDER — JUBBONTI 60 MG/ML ~~LOC~~ SOSY
60.0000 mg | PREFILLED_SYRINGE | SUBCUTANEOUS | 0 refills | Status: AC
  Filled 2024-06-03: qty 1, 180d supply, fill #0

## 2024-05-20 MED ORDER — DENOSUMAB-BBDZ 60 MG/ML ~~LOC~~ SOSY
60.0000 mg | PREFILLED_SYRINGE | Freq: Once | SUBCUTANEOUS | Status: AC
  Administered 2024-06-18: 60 mg via SUBCUTANEOUS

## 2024-05-20 NOTE — Progress Notes (Signed)
 See benefits inv encounter from 05/13/24. Copay is $0.

## 2024-05-20 NOTE — Telephone Encounter (Signed)
 Pt changing to Jubbonti.  Referral has been put in.

## 2024-05-22 DIAGNOSIS — M4120 Other idiopathic scoliosis, site unspecified: Secondary | ICD-10-CM | POA: Diagnosis not present

## 2024-05-22 DIAGNOSIS — M542 Cervicalgia: Secondary | ICD-10-CM | POA: Diagnosis not present

## 2024-05-22 DIAGNOSIS — M545 Low back pain, unspecified: Secondary | ICD-10-CM | POA: Diagnosis not present

## 2024-05-23 ENCOUNTER — Encounter (HOSPITAL_COMMUNITY): Payer: Self-pay

## 2024-05-23 DIAGNOSIS — M79671 Pain in right foot: Secondary | ICD-10-CM | POA: Diagnosis not present

## 2024-05-23 DIAGNOSIS — M25571 Pain in right ankle and joints of right foot: Secondary | ICD-10-CM | POA: Diagnosis not present

## 2024-05-27 ENCOUNTER — Ambulatory Visit: Admitting: Gastroenterology
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
## 2024-05-29 DIAGNOSIS — M4120 Other idiopathic scoliosis, site unspecified: Secondary | ICD-10-CM | POA: Diagnosis not present

## 2024-05-29 DIAGNOSIS — M545 Low back pain, unspecified: Secondary | ICD-10-CM | POA: Diagnosis not present

## 2024-05-29 DIAGNOSIS — M542 Cervicalgia: Secondary | ICD-10-CM | POA: Diagnosis not present

## 2024-05-30 ENCOUNTER — Telehealth (HOSPITAL_COMMUNITY): Admitting: Psychiatry

## 2024-05-30 ENCOUNTER — Encounter (HOSPITAL_COMMUNITY): Payer: Self-pay | Admitting: Psychiatry

## 2024-05-30 ENCOUNTER — Encounter: Payer: Self-pay | Admitting: Family Medicine

## 2024-05-30 ENCOUNTER — Encounter (HOSPITAL_COMMUNITY): Payer: Self-pay

## 2024-05-30 DIAGNOSIS — T50905A Adverse effect of unspecified drugs, medicaments and biological substances, initial encounter: Secondary | ICD-10-CM | POA: Diagnosis not present

## 2024-05-30 DIAGNOSIS — G2401 Drug induced subacute dyskinesia: Secondary | ICD-10-CM | POA: Diagnosis not present

## 2024-05-30 DIAGNOSIS — F3341 Major depressive disorder, recurrent, in partial remission: Secondary | ICD-10-CM

## 2024-05-30 DIAGNOSIS — F411 Generalized anxiety disorder: Secondary | ICD-10-CM

## 2024-05-30 MED ORDER — VENLAFAXINE HCL ER 75 MG PO CP24: 225.0000 mg | ORAL_CAPSULE | Freq: Every day | ORAL | 2 refills | Status: AC

## 2024-05-30 MED ORDER — PINDOLOL 5 MG PO TABS: 5.0000 mg | ORAL_TABLET | Freq: Every day | ORAL | 2 refills | Status: AC

## 2024-05-30 MED ORDER — RISPERIDONE 0.5 MG PO TABS: 0.2500 mg | ORAL_TABLET | Freq: Every day | ORAL | 2 refills | Status: AC

## 2024-05-31 DIAGNOSIS — S86311A Strain of muscle(s) and tendon(s) of peroneal muscle group at lower leg level, right leg, initial encounter: Secondary | ICD-10-CM | POA: Diagnosis not present

## 2024-06-03 ENCOUNTER — Other Ambulatory Visit: Payer: Self-pay

## 2024-06-03 ENCOUNTER — Other Ambulatory Visit (HOSPITAL_COMMUNITY): Payer: Self-pay

## 2024-06-03 NOTE — Progress Notes (Signed)
 Specialty Pharmacy Initial Fill Coordination Note  Victoria [name] is a 68 y.o. female contacted today regarding initial fill of specialty medication(s) Denosumab -bbdz (Jubbonti )   Patient requested Courier to Provider Office   Delivery date: 06/11/24   Verified address: [facility]  [address]   Medication will be filled on: 06/10/24   Patient is aware of $0 copayment.

## 2024-06-08 ENCOUNTER — Other Ambulatory Visit: Payer: Self-pay | Admitting: Nurse Practitioner

## 2024-06-08 ENCOUNTER — Other Ambulatory Visit (HOSPITAL_COMMUNITY): Payer: Self-pay | Admitting: Psychiatry

## 2024-06-08 DIAGNOSIS — F419 Anxiety disorder, unspecified: Secondary | ICD-10-CM

## 2024-06-17 ENCOUNTER — Telehealth: Payer: Self-pay

## 2024-06-17 NOTE — Telephone Encounter (Signed)
 Returned pts phone call and she needed her nurse visit appointment rescheduled to a later day on tomorrow, 06/18/24. Appointment was rescheduled to 2:15 PM.

## 2024-06-17 NOTE — Telephone Encounter (Signed)
 Copied from CRM [id number]. Topic: Appointments - Scheduling Inquiry for Clinic >> Jun 17, 2024 10:20 AM Victoria [name] wrote: Reason for CRM: Patient initially called in to reschedule her appt for tomorrow to a later time within the day - possibly after 12 pm due to having another appt, same day at another location.   If we are able to fit the patient into the lab for her injection within her time requested, please reach out to her ASAP.

## 2024-06-18 ENCOUNTER — Ambulatory Visit: Admitting: *Deleted

## 2024-06-18 DIAGNOSIS — M810 Age-related osteoporosis without current pathological fracture: Secondary | ICD-10-CM | POA: Diagnosis not present

## 2024-06-18 MED ORDER — DENOSUMAB-BBDZ 60 MG/ML ~~LOC~~ SOSY: 60.0000 mg | PREFILLED_SYRINGE | Freq: Once | SUBCUTANEOUS | Status: AC

## 2024-06-18 NOTE — Progress Notes (Signed)
 Patient here for Jubbonti  injection per physicians orders  Jubbonti  60 mg SQ , was administered left arm today. Patient tolerated injection.  Patient next injection due: 6 months, appt made:  No- will schedule in 5 months after benefits are ran again  Initial injection: yes (for Jubbonti )  Did Jubbonti  come from pharmacy (if yes please select patient supplied): yes  Cam placed for next injection: yes

## 2024-06-26 ENCOUNTER — Ambulatory Visit: Attending: Internal Medicine

## 2024-06-26 DIAGNOSIS — J4550 Severe persistent asthma, uncomplicated: Secondary | ICD-10-CM

## 2024-06-26 DIAGNOSIS — J8283 Eosinophilic asthma: Secondary | ICD-10-CM

## 2024-06-26 MED ORDER — FASENRA PEN 30 MG/ML ~~LOC~~ SOAJ
30.0000 mg | SUBCUTANEOUS | 2 refills | Status: AC
  Filled 2024-07-17: qty 1, 56d supply, fill #0

## 2024-06-26 NOTE — Telephone Encounter (Signed)
 See pharmacotherapy visit note 06/26/24 - onboarding counseling completed for Fasenra .

## 2024-06-26 NOTE — Progress Notes (Signed)
 [name]  Referring Provider: Dr. Geronimo  Virtual Visit via Telephone Note  I connected with [name] on 06/26/2024 at 12:00 PM EST by telephone and verified that I am speaking with the correct person using two identifiers.  Location: Patient: home Provider: office   I discussed the limitations, risks, security and privacy concerns of performing an evaluation and management service by telephone and the availability of in person appointments. I also discussed with the patient that there may be a patient responsible charge related to this service. The patient expressed understanding and agreed to proceed.   HPI: Victoria [name] is a 69 y.o. female who presents to the pharmacotherapy clinic via telephone for Fasenra  follow-up counseling. Previously filled Fasenra  through medication assistance in 2025, but filling on grant through [facility] in 2026. Contacting patient to discuss.  Patient Active Problem List   Diagnosis Date Noted   Pulmonary embolism (HCC) 07/28/2023   Lymphocytic colitis 06/29/2023   Fecal soiling due to fecal incontinence 06/29/2023   Prolapse of female pelvic organs 10/18/2022   Breast cancer (HCC) 10/13/2022   Genetic testing 10/12/2022   PALB2 positive 10/12/2022   Primary malignant neoplasm of lower-outer quadrant of breast, left (HCC) 10/10/2022   Preoperative cardiovascular examination 09/20/2022   Major depressive disorder, recurrent episode, severe (HCC) 08/14/2022   Passive suicidal ideations 08/12/2022   Scleritis of right eye 02/18/2022   Lumbar radiculopathy 09/20/2021   Sacroiliac joint dysfunction of right side 08/16/2021   Greater trochanteric pain syndrome of left lower extremity 07/29/2021   Diverticulitis 07/07/2021   Iron  deficiency anemia due to chronic blood loss 09/21/2020   Family history of breast cancer 09/21/2020   Preventative health care 07/01/2020   Hemorrhoids 07/01/2020   LUQ  discomfort 07/01/2020   Occipital pain 06/27/2019   Tinnitus of both ears 06/27/2019   Statin myopathy 06/19/2019   Other cervical disc degeneration, unspecified cervical region 06/03/2019   Headache 03/19/2019   Heart disease 02/21/2019   Neck pain 02/17/2019   Hx of cold sores 10/09/2018   Bronchitis with asthma, acute 07/18/2018   Osteoporosis 05/23/2018   S/P angioplasty with stent 08/23/17 ostial 2nd OM with DES synnergy 08/24/2017   CAD (coronary artery disease)    Anxiety and depression 01/16/2017   Insomnia 01/16/2017   Acute recurrent sinusitis 05/27/2016   Muscle spasm 05/03/2016   Chronic pain 05/03/2016   Severe persistent asthma with intensive monitoring (HCC) 03/22/2016   BMI 30.0-30.9,adult 02/18/2016   MRSA (methicillin resistant Staphylococcus aureus) colonization 01/12/2016   Sleep disorder due to stress 01/12/2016   Hyperglycemia 11/20/2015   Chronic diastolic CHF (congestive heart failure), NYHA class 2 (HCC) 06/26/2015   Coronary artery disease due to lipid rich plaque 06/26/2015   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HCC) 02/03/2015   Precordial chest pain    Cardiomegaly 11/21/2014   Seasonal and perennial allergic rhinitis 10/22/2011   Fatigue 09/01/2011   Chest pain, atypical 06/01/2011   OSA (obstructive sleep apnea) 11/13/2010   Umbilical hernia 06/11/2010   Diverticulosis of colon 06/11/2010   HEPATIC CYST 06/11/2010   Idiopathic scoliosis and kyphoscoliosis 06/11/2010   TRANSAMINASES, SERUM, ELEVATED 05/27/2010   Abdominal pain 01/15/2010   ELEVATED BP READING WITHOUT DX HYPERTENSION 08/03/2009   Gastroesophageal reflux disease with hiatal hernia 10/21/2008   h/o PALPITATIONS 08/19/2008   A B P A-ALLERGIC BRONCHOPULMONARY ASPERGILLOSIS 12/14/2007   Asthma 11/16/2007   Hyperlipidemia, mixed 09/11/2007   STRESS INCONTINENCE 09/11/2007   Cushing's syndrome 12/11/2006  OA (osteoarthritis) of knee 12/11/2006    Patient's Medications  New Prescriptions   No  medications on file  Previous Medications   ACETAMINOPHEN  (TYLENOL ) 650 MG CR TABLET    Take 650-1,300 mg by mouth every 8 (eight) hours as needed for pain.   ALBUTEROL  (VENTOLIN  HFA) 108 (90 BASE) MCG/ACT INHALER    Inhale 1-2 puffs into the lungs every 6 (six) hours as needed.   ASCORBIC ACID (VITAMIN C) 500 MG TABLET    Take 500 mg by mouth every evening.   BENRALIZUMAB  (FASENRA  PEN) 30 MG/ML PREFILLED AUTOINJECTOR    Inject 1 mL (30 mg total) into the skin every 8 (eight) weeks. ** AZ&ME PATIENT ASSISTANCE**   CALCIUM -MAGNESIUM -VITAMIN D  (CALCIUM  MAGNESIUM  PO)    Take 1 tablet by mouth at bedtime.   CLONAZEPAM  (KLONOPIN ) 0.5 MG TABLET    Take 1 tablet (0.5 mg total) by mouth in the morning.   DENOSUMAB -BBDZ (JUBBONTI ) 60 MG/ML SOSY INJECTION    Inject 60 mg into the skin every 6 (six) months. Dx code: M81.0.  pt has appt on 06/18/24   DOXEPIN  (SINEQUAN ) 25 MG CAPSULE    TAKE 1 CAPSULE (25 MG TOTAL) BY MOUTH AT BEDTIME.   ELIQUIS  5 MG TABS TABLET    TAKE 1 TABLET BY MOUTH 2 TIMES DAILY.   FAMOTIDINE  (PEPCID ) 20 MG TABLET    TAKE 1 TABLET BY MOUTH TWICE A DAY   IPRATROPIUM-ALBUTEROL  (DUONEB) 0.5-2.5 (3) MG/3ML SOLN    Take 3 mLs by nebulization every 6 (six) hours as needed.   NITROGLYCERIN  (NITROSTAT ) 0.4 MG SL TABLET    Place 1 tablet (0.4 mg total) under the tongue every 5 (five) minutes as needed for chest pain.   PINDOLOL  (VISKEN ) 5 MG TABLET    Take 1 tablet (5 mg total) by mouth daily after breakfast.   REPATHA  SURECLICK 140 MG/ML SOAJ    INJECT 140 MG INTO THE SKIN EVERY 14 DAYS.   RISPERIDONE  (RISPERDAL ) 0.5 MG TABLET    Take 0.5 tablets (0.25 mg total) by mouth at bedtime.   VALBENAZINE  (INGREZZA ) 40 MG CAPSULE    Take 1 capsule (40 mg total) by mouth daily.   VENLAFAXINE  XR (EFFEXOR -XR) 75 MG 24 HR CAPSULE    Take 3 capsules (225 mg total) by mouth daily with breakfast.  Modified Medications   No medications on file  Discontinued Medications   No medications on file     Allergies: Allergies[1]  Past Medical History: Past Medical History:  Diagnosis Date   Allergic bronchopulmonary aspergillosis (HCC) 2008   sees Dr Katheren Finn pulmonology   Anemia    iron  deficiency, resolved   Anxiety    Asthma    Breast cancer (HCC)    CAD (coronary artery disease)    a. LHC 6/16:  oOM1 60, pRCA 25 >> med Rx b. cath 3/19 2nd OM with 95% stenosis s/p synergy DES & anomalous RCA   CAP (community acquired pneumonia) 2016; 06/07/2016   Chronic bronchitis (HCC)    Chronic lower back pain    Clotting disorder 07/04/2020   maroon blood in stools - 3 episodes in two hours   Complication of anesthesia    think I have a hard time waking up from it   COPD (chronic obstructive pulmonary disease) (HCC)    Depression    mild   Diverticulitis    Diverticulosis    GERD (gastroesophageal reflux disease)    H/O hiatal hernia    Headache  weekly (08/23/2017)   History of echocardiogram    Echo 6/16:  Mod LVH, EF 60-65%, no RWMA, Gr 1 DD, trivial MR, normal LA size.   History of radiation therapy    Left breast- 12/26/22-01/23/23-Dr. Lynwood Nasuti   Hyperglycemia 11/20/2015   Hyperlipidemia, mixed 09/11/2007   Qualifier: Diagnosis of  By: Antonio ROSALEA Rockers   Did not tolerate Lipitor, zocor, Lovastatin, Pravastatin, Livalo , Crestor  even low dose    IBS (irritable bowel syndrome)    Maxillary sinusitis    Normal cardiac stress test 11/2011   No evidence of ischemia or infarct.   Calculated ejection fraction 72%.   Obesity    OSA (obstructive sleep apnea) 02/2012   has stopped using  cpap   Osteoarthritis    Osteoporosis    Personal history of radiation therapy    Pneumonia 11/2011   before 2013 I hadn't had pneumonia since I was a child (04/13/2012)   Pulmonary nodules    S/P angioplasty with stent 08/23/17 ostial 2nd OM with DES synnergy 08/24/2017   Schatzki's ring     Social History: Social History   Socioeconomic History   Marital status:  Married    Spouse name: Not on file   Number of children: 1   Years of education: 14   Highest education level: Associate degree: academic program  Occupational History   Occupation: Disabled   Tobacco Use   Smoking status: Never    Passive exposure: Never   Smokeless tobacco: Never  Vaping Use   Vaping status: Never Used  Substance and Sexual Activity   Alcohol use: Yes    Alcohol/week: 4.0 - 7.0 standard drinks of alcohol    Types: 4 - 7 Standard drinks or equivalent per week   Drug use: No   Sexual activity: Yes    Birth control/protection: Surgical    Comment: gluten free, lives with husband and son with CP quadriplegia  Other Topics Concern   Not on file  Social History Narrative   Cares for son with cerebral palsy.    Lives at home with her husband and son.   Right-handed.   2 cups caffeine per day.   One story home   Social Drivers of Health   Tobacco Use: Low Risk (05/30/2024)   Patient History    Smoking Tobacco Use: Never    Smokeless Tobacco Use: Never    Passive Exposure: Never  Financial Resource Strain: Low Risk (01/18/2023)   Overall Financial Resource Strain (CARDIA)    Difficulty of Paying Living Expenses: Not hard at all  Food Insecurity: No Food Insecurity (02/13/2024)   Epic    Worried About Radiation Protection Practitioner of Food in the Last Year: Never true    Ran Out of Food in the Last Year: Never true  Transportation Needs: No Transportation Needs (02/13/2024)   Epic    Lack of Transportation (Medical): No    Lack of Transportation (Non-Medical): No  Physical Activity: Inactive (02/13/2024)   Exercise Vital Sign    Days of Exercise per Week: 0 days    Minutes of Exercise per Session: 0 min  Stress: No Stress Concern Present (02/13/2024)   [employer] of Occupational Health - Occupational Stress Questionnaire    Feeling of Stress: Only a little  Social Connections: Socially Integrated (02/13/2024)   Social Connection and Isolation Panel    Frequency of  Communication with Friends and Family: More than three times a week    Frequency of Social Gatherings with Friends  and Family: Twice a week    Attends Religious Services: More than 4 times per year    Active Member of Clubs or Organizations: Yes    Attends Banker Meetings: More than 4 times per year    Marital Status: Married  Depression (PHQ2-9): Low Risk (02/13/2024)   Depression (PHQ2-9)    PHQ-2 Score: 0  Alcohol Screen: Low Risk (02/13/2024)   Alcohol Screen    Last Alcohol Screening Score (AUDIT): 0  Housing: Unknown (02/13/2024)   Epic    Unable to Pay for Housing in the Last Year: No    Number of Times Moved in the Last Year: Not on file    Homeless in the Last Year: No  Utilities: Not At Risk (02/13/2024)   Epic    Threatened with loss of utilities: No  Health Literacy: Adequate Health Literacy (02/13/2024)   B1300 Health Literacy    Frequency of need for help with medical instructions: Never    Medication: Fasenra  30mg /mL [place] autoinjector  Patient enrolled into asthma grant through [employer] Amount: $2000 Award Period: 11/12/2023 - 05/10/2025 ID: [id number] BIN: [id number] PCN: [id number] Group: [id number] For pharmacy inquiries, contact PDMI at [phone]. For patient inquiries, contact PAF at [phone].  Side effects/concerns: Patient denies.  Plan: - CONTINUE Fasenra  30mg  [place] every 8 weeks  - Rx for Fasenra  triaged to [facility]  - CONTINUE remaining respiratory medications as prescribed. - Follow-up with Dr. Geronimo as planned on 07/30/24  I discussed the assessment and treatment plan with the patient. The patient was provided an opportunity to ask questions and all were answered. The patient agreed with the plan and demonstrated an understanding of the instructions.   The patient was advised to call back or seek an in-person evaluation if the symptoms worsen or if the condition fails to improve as anticipated.  I provided 10 minutes  of non-face-to-face time during this encounter.  Aleck Puls, PharmD, BCPS, CPP Clinical Pharmacist  [facility]  [facility]       [1]  Allergies Allergen Reactions   Latuda  [Lurasidone  Hcl] Other (See Comments)    PER THE PT, CAUSED RESTLESSNESS   Beclomethasone Dipropionate Hives and Other (See Comments)    Weight gain, also   Flexeril  [Cyclobenzaprine ] Anxiety   Mometasone  Furo-Formoterol Fum Hives and Other (See Comments)    Weight gain, also   Sulfonamide Derivatives Hives and Rash   Statins Other (See Comments)    Myalgias, RLS   Sulfamethoxazole-Trimethoprim     Other Reaction(s): Not available   Tamoxifen  Other (See Comments)    Other Reaction(s): Not available  tamoxifen    Fluticasone  Rash and Other (See Comments)    Weight gain, too

## 2024-07-01 NOTE — Progress Notes (Unsigned)
 BH MD/PA/NP OP Progress Note  07/05/2024 10:59 AM [name]  MRN:  [id number]  Visit Diagnosis:    ICD-10-CM   1. Recurrent major depressive disorder, in partial remission  F33.41     2. Anxiety and depression  F41.9 clonazePAM  (KLONOPIN ) 0.5 MG tablet   F32.A     3. GAD (generalized anxiety disorder)  F41.1     4. Tardive dyskinesia  G24.01 valbenazine  (INGREZZA ) 60 MG capsule      Assessment: Victoria [name] is a 69 y.o. female with a history of anxiety, depression, OSA who presented to [facility] at [facility] for initial evaluation on 06/30/2022.  At initial evaluation patient reported symptoms of anxiety including feeling nervous or on edge, being unable to stop controlled worrying, worrying too much about different things, and fear that something awful would happen.  Patient's symptoms have been gradually progressing.  There was a period in November 2023 where she was having passive SI without intent or plan.  Patient also did endorse low mood, poor sleep, difficulty concentrating.  She does have a history of obstructive sleep apnea and does not use CPAP with last sleep study being over 10 years ago. Psychosocially patient has increased stress of caring for her adult son with cerebral palsy and planning the next steps for him after she and her husband pass or are no longer able to care for him.  Patient met criteria for MDD and generalized anxiety disorder.   [name] presents for follow-up evaluation. Today, 07/05/24, patients anxiety and depressive symptoms have remained stable.  She is tolerating the Ingrezza  well with only identifiable side effect being some mild increase in restlessness in the evenings.  Restless legs was a pre-existing condition but she has noticed a slight increase since starting aggressive.  When she is occupied the restless legs are not noticeable.  Tardive dyskinesia symptoms are still present and patient endorses tongue movements  relatively unchanged.  Will titrate Ingrezza  to 60 mg daily and reviewed the risk and benefits.  Will continue on remainder of regimen and follow-up in a month.  Plan at that time will be to taper off of Risperdal .  Plan: - Continue Effexor  XR 150 mg QAM and 75 mg QHS - Continue Risperdal  0.25 mg at bedtime, plan to discontinue at next visit - Continue Doxepin  25 mg QHS - Continue Klonopin  0.5 mg QD prn for anxiety - Continue pindolol  5 mg every day - Increase Ingrezza  60 mg QHS - Continue Semaglutide  in the interim managed by an online provider, patient plans to DC in a couple weeks due to lack of benefit. - Recommend repeat sleep study, could consider dental devices OSA still present - CMP, CBC, lipid profile, Vit D, A1c, TSH reviewed - Continue with therapy once a month through [facility] - Completed a 10 week wellness group through the [facility], followed by a group for special needs parents - Crisis resources reviewed - Follow up in a month  Chief Complaint:  Chief Complaint  Patient presents with   Follow-up   HPI: On presentation today [name] reports that she is doing well. Her mood has been pretty good this past month. There have been a few days that she feels a bit more somber and has noticed these correlate with the weather.  Days that are more dreary or rainy can make her feel more depressed.  Depression presents with some decrease in motivation on these days.  Anxiety symptoms have been well-controlled.  There has been some slight concern around her husband's health lately his he had an elevated alkaline phosphatase level.  That said she is not finding himself perseverating or ruminating on this and is going about her typical day.  Patient is still dealing with tongue movements and it has changed to the tongue darting between her front teeth.  She not noticed the previously endorsed improvement from the Ingrezza .  She wonders about titration does express some anxiety on the adverse  side effects.  Discussed risk and benefits with her and ultimately patient was open to trialing Ingrezza  increased to 60 mg.  Reviewed that if she does experience adverse side effects we can change dosing to alternate between 40 and 60 dosing every day.  Patient is interested in coming off the Risperdal  we discussed doing this at next appointment to limit the number changes in better identify the effects of the titration of Ingrezza .  She also mention that she will be getting a new therapist soon as her prior provider has left.  Past Psychiatric History: One  prior psychiatric hospitalization to [facility] in February of 2024 due to increased depression/anxiety. No prior suicide attempts.  Patient did endorse passive SI in November of 2023 without any intent or plan.  She denies being connected with a psychiatrist in the past and had recently connected with a therapist at [facility] therapy.  Has taken Effexor  (possible restlessness at 225), Cymbalta , Remeron , Ativan , Xanax , Klonopin , gabapentin , BuSpar , Zyprexa  (TD), Latuda  (akathisia), Risperdal , Doxepin ,   Denies substance use other than 1 beer a day. Can increase to 2 when the weather is nice.  Past Medical History:  Past Medical History:  Diagnosis Date   Allergic bronchopulmonary aspergillosis (HCC) 2008   sees Dr Katheren Finn pulmonology   Anemia    iron  deficiency, resolved   Anxiety    Asthma    Breast cancer (HCC)    CAD (coronary artery disease)    a. LHC 6/16:  oOM1 60, pRCA 25 >> med Rx b. cath 3/19 2nd OM with 95% stenosis s/p synergy DES & anomalous RCA   CAP (community acquired pneumonia) 2016; 06/07/2016   Chronic bronchitis (HCC)    Chronic lower back pain    Clotting disorder 07/04/2020   maroon blood in stools - 3 episodes in two hours   Complication of anesthesia    think I have a hard time waking up from it   COPD (chronic obstructive pulmonary disease) (HCC)    Depression    mild   Diverticulitis    Diverticulosis     GERD (gastroesophageal reflux disease)    H/O hiatal hernia    Headache    weekly (08/23/2017)   History of echocardiogram    Echo 6/16:  Mod LVH, EF 60-65%, no RWMA, Gr 1 DD, trivial MR, normal LA size.   History of radiation therapy    Left breast- 12/26/22-01/23/23-Dr. Lynwood Nasuti   Hyperglycemia 11/20/2015   Hyperlipidemia, mixed 09/11/2007   Qualifier: Diagnosis of  By: Antonio ROSALEA Rockers   Did not tolerate Lipitor, zocor, Lovastatin, Pravastatin, Livalo , Crestor  even low dose    IBS (irritable bowel syndrome)    Maxillary sinusitis    Normal cardiac stress test 11/2011   No evidence of ischemia or infarct.   Calculated ejection fraction 72%.   Obesity    OSA (obstructive sleep apnea) 02/2012   has stopped using  cpap   Osteoarthritis    Osteoporosis    Personal history of radiation therapy  Pneumonia 11/2011   before 2013 I hadn't had pneumonia since I was a child (04/13/2012)   Pulmonary nodules    S/P angioplasty with stent 08/23/17 ostial 2nd OM with DES synnergy 08/24/2017   Schatzki's ring     Past Surgical History:  Procedure Laterality Date   ANTERIOR AND POSTERIOR REPAIR N/A 10/18/2022   Procedure: ANTERIOR (CYSTOCELE);  Surgeon: Mat Browning, MD;  Location: [facility] OR;  Service: Gynecology;  Laterality: N/A;   APPENDECTOMY  1989   BREAST BIOPSY  11/03/2022   MM LT RADIOACTIVE SEED LOC MAMMO GUIDE 11/03/2022 [facility] MAMMOGRAPHY   BREAST LUMPECTOMY WITH RADIOACTIVE SEED LOCALIZATION Left 11/04/2022   Procedure: LEFT BREAST LUMPECTOMY WITH RADIOACTIVE SEED LOCALIZATION;  Surgeon: Curvin Deward MOULD, MD;  Location: [facility];  Service: General;  Laterality: Left;   CARDIAC CATHETERIZATION N/A 11/25/2014   Procedure: Right/Left Heart Cath and Coronary Angiography;  Surgeon: Victory LELON Sharps, MD;  Location: [facility] INVASIVE CV LAB;  Service: Cardiovascular;  Laterality: N/A;   CESAREAN SECTION  1985   COLONOSCOPY  03/2022   CORONARY ANGIOPLASTY WITH STENT PLACEMENT   08/23/2017   CORONARY STENT INTERVENTION N/A 08/23/2017   Procedure: CORONARY STENT INTERVENTION;  Surgeon: Verlin Lonni BIRCH, MD;  Location: [facility] INVASIVE CV LAB;  Service: Cardiovascular;  Laterality: N/A;   HERNIA REPAIR  04/13/2012   VHR laparoscopic   LAPAROSCOPIC BILATERAL SALPINGO OOPHERECTOMY Bilateral 10/18/2022   Procedure: LAPAROTOMY WITH BILATERAL SALPINGO OOPHORECTOMY;  Surgeon: Mat Browning, MD;  Location: [facility] OR;  Service: Gynecology;  Laterality: Bilateral;   LEFT HEART CATH AND CORONARY ANGIOGRAPHY N/A 08/23/2017   Procedure: LEFT HEART CATH AND CORONARY ANGIOGRAPHY;  Surgeon: Verlin Lonni BIRCH, MD;  Location: [facility] INVASIVE CV LAB;  Service: Cardiovascular;  Laterality: N/A;   VAGINAL HYSTERECTOMY N/A 10/18/2022   Procedure: HYSTERECTOMY VAGINAL;  Surgeon: Mat Browning, MD;  Location: [facility] OR;  Service: Gynecology;  Laterality: N/A;   VENTRAL HERNIA REPAIR  04/13/2012   Procedure: LAPAROSCOPIC VENTRAL HERNIA;  Surgeon: Elon CHRISTELLA Pacini, MD;  Location: [facility] OR;  Service: General;  Laterality: N/A;  laparoscopic repair of incarcerated hernia   Family History:  Family History  Problem Relation Age of Onset   Breast cancer Mother 30       bilateral breast cancer dx. 56, met to liver   Hypertension Mother    Diabetes Mother    Cancer Mother    Diverticulosis Father    Prostate cancer Father    Breast cancer Sister 66       DCIS at 68, IDC at 51, PALB2+   Cancer Sister        breast cancer, invasive ductal carcinoma in 2022,DCIS at 81 with 4 weeks of radiation, 5 years of Tamoxifen     Depression Sister    Hyperlipidemia Sister    Pulmonary embolism Brother        recurrent   Heart attack Maternal Grandfather    Ovarian cancer Paternal Grandmother    Cerebral palsy Son    Prostate cancer Paternal Uncle    Breast cancer Niece 71       PALB2+   Osteoporosis Niece    Stroke Neg Hx    Colon cancer Neg Hx    Esophageal cancer Neg Hx    Stomach cancer Neg Hx     Rectal cancer Neg Hx     Social History:  Social History   Socioeconomic History   Marital status: Married    Spouse name: Not on file  Number of children: 1   Years of education: 14   Highest education level: Associate degree: academic program  Occupational History   Occupation: Disabled   Tobacco Use   Smoking status: Never    Passive exposure: Never   Smokeless tobacco: Never  Vaping Use   Vaping status: Never Used  Substance and Sexual Activity   Alcohol use: Yes    Alcohol/week: 4.0 - 7.0 standard drinks of alcohol    Types: 4 - 7 Standard drinks or equivalent per week   Drug use: No   Sexual activity: Yes    Birth control/protection: Surgical    Comment: gluten free, lives with husband and son with CP quadriplegia  Other Topics Concern   Not on file  Social History Narrative   Cares for son with cerebral palsy.    Lives at home with her husband and son.   Right-handed.   2 cups caffeine per day.   One story home   Social Drivers of Health   Tobacco Use: Low Risk (07/05/2024)   Patient History    Smoking Tobacco Use: Never    Smokeless Tobacco Use: Never    Passive Exposure: Never  Financial Resource Strain: Low Risk (01/18/2023)   Overall Financial Resource Strain (CARDIA)    Difficulty of Paying Living Expenses: Not hard at all  Food Insecurity: No Food Insecurity (02/13/2024)   Epic    Worried About Radiation Protection Practitioner of Food in the Last Year: Never true    Ran Out of Food in the Last Year: Never true  Transportation Needs: No Transportation Needs (02/13/2024)   Epic    Lack of Transportation (Medical): No    Lack of Transportation (Non-Medical): No  Physical Activity: Inactive (02/13/2024)   Exercise Vital Sign    Days of Exercise per Week: 0 days    Minutes of Exercise per Session: 0 min  Stress: No Stress Concern Present (02/13/2024)   [employer] of Occupational Health - Occupational Stress Questionnaire    Feeling of Stress: Only a little  Social  Connections: Socially Integrated (02/13/2024)   Social Connection and Isolation Panel    Frequency of Communication with Friends and Family: More than three times a week    Frequency of Social Gatherings with Friends and Family: Twice a week    Attends Religious Services: More than 4 times per year    Active Member of Clubs or Organizations: Yes    Attends Banker Meetings: More than 4 times per year    Marital Status: Married  Depression (PHQ2-9): Low Risk (02/13/2024)   Depression (PHQ2-9)    PHQ-2 Score: 0  Alcohol Screen: Low Risk (02/13/2024)   Alcohol Screen    Last Alcohol Screening Score (AUDIT): 0  Housing: Unknown (02/13/2024)   Epic    Unable to Pay for Housing in the Last Year: No    Number of Times Moved in the Last Year: Not on file    Homeless in the Last Year: No  Utilities: Not At Risk (02/13/2024)   Epic    Threatened with loss of utilities: No  Health Literacy: Adequate Health Literacy (02/13/2024)   B1300 Health Literacy    Frequency of need for help with medical instructions: Never    Allergies:  Allergies  Allergen Reactions   Latuda  [Lurasidone  Hcl] Other (See Comments)    PER THE PT, CAUSED RESTLESSNESS   Beclomethasone Dipropionate Hives and Other (See Comments)    Weight gain, also   Flexeril  [Cyclobenzaprine ]  Anxiety   Mometasone  Furo-Formoterol Fum Hives and Other (See Comments)    Weight gain, also   Sulfonamide Derivatives Hives and Rash   Statins Other (See Comments)    Myalgias, RLS   Sulfamethoxazole-Trimethoprim     Other Reaction(s): Not available   Tamoxifen  Other (See Comments)    Other Reaction(s): Not available  tamoxifen    Fluticasone  Rash and Other (See Comments)    Weight gain, too    Current Medications: Current Outpatient Medications  Medication Sig Dispense Refill   valbenazine  (INGREZZA ) 60 MG capsule Take 1 capsule (60 mg total) by mouth daily. 30 capsule 2   acetaminophen  (TYLENOL ) 650 MG CR tablet Take  650-1,300 mg by mouth every 8 (eight) hours as needed for pain.     albuterol  (VENTOLIN  HFA) 108 (90 Base) MCG/ACT inhaler Inhale 1-2 puffs into the lungs every 6 (six) hours as needed. 8 g 2   ascorbic acid (VITAMIN C) 500 MG tablet Take 500 mg by mouth every evening.     benralizumab  (FASENRA  PEN) 30 MG/ML prefilled autoinjector Inject 1 mL (30 mg total) into the skin every 8 (eight) weeks. 1 mL 2   Calcium -Magnesium -Vitamin D  (CALCIUM  MAGNESIUM  PO) Take 1 tablet by mouth at bedtime.     clonazePAM  (KLONOPIN ) 0.5 MG tablet Take 1 tablet (0.5 mg total) by mouth in the morning. 30 tablet 1   denosumab -bbdz (JUBBONTI ) 60 MG/ML SOSY injection Inject 60 mg into the skin every 6 (six) months. Dx code: M81.0.  pt has appt on 06/18/24 1 mL 0   doxepin  (SINEQUAN ) 25 MG capsule TAKE 1 CAPSULE (25 MG TOTAL) BY MOUTH AT BEDTIME. 30 capsule 2   ELIQUIS  5 MG TABS tablet TAKE 1 TABLET BY MOUTH 2 TIMES DAILY. 60 tablet 4   famotidine  (PEPCID ) 20 MG tablet TAKE 1 TABLET BY MOUTH TWICE A DAY 60 tablet 1   ipratropium-albuterol  (DUONEB) 0.5-2.5 (3) MG/3ML SOLN Take 3 mLs by nebulization every 6 (six) hours as needed. 120 mL 5   nitroGLYCERIN  (NITROSTAT ) 0.4 MG SL tablet Place 1 tablet (0.4 mg total) under the tongue every 5 (five) minutes as needed for chest pain. 25 tablet 11   pindolol  (VISKEN ) 5 MG tablet Take 1 tablet (5 mg total) by mouth daily after breakfast. 30 tablet 2   REPATHA  SURECLICK 140 MG/ML SOAJ INJECT 140 MG INTO THE SKIN EVERY 14 DAYS. 2 mL 11   risperiDONE  (RISPERDAL ) 0.5 MG tablet Take 0.5 tablets (0.25 mg total) by mouth at bedtime. 30 tablet 2   venlafaxine  XR (EFFEXOR -XR) 75 MG 24 hr capsule Take 3 capsules (225 mg total) by mouth daily with breakfast. 90 capsule 2   Current Facility-Administered Medications  Medication Dose Route Frequency Provider Last Rate Last Admin   [START ON 12/12/2024] denosumab -bbdz (JUBBONTI ) injection 60 mg  60 mg Subcutaneous Once Blyth, Stacey A, MD          Psychiatric Specialty Exam: Review of Systems  There were no vitals taken for this visit.There is no height or weight on file to calculate BMI.  General Appearance: Well Groomed  Eye Contact:  Good  Speech:  Clear and Coherent and Normal Rate  Volume:  Normal  Mood:  Euthymic  Affect:  Appropriate  Thought Process:  Coherent  Orientation:  Full (Time, Place, and Person)  Thought Content: Logical   Suicidal Thoughts:  No  Homicidal Thoughts:  No  Memory:  Immediate;   Good  Judgement:  Fair  Insight:  Fair  Psychomotor Activity:  TD  Concentration:  Concentration: Good  Recall:  Good  Fund of Knowledge: Fair  Language: Good  Akathisia:  No    AIMS (if indicated): 4  Assets:  Communication Skills Desire for Improvement Financial Resources/Insurance Housing Transportation  ADL's:  Intact  Cognition: WNL  Sleep:  Good   Metabolic Disorder Labs: Lab Results  Component Value Date   HGBA1C 5.5 12/12/2023   MPG 117 10/22/2008   Lab Results  Component Value Date   PROLACTIN 15.4 08/12/2022   Lab Results  Component Value Date   CHOL 134 12/12/2023   TRIG 98.0 12/12/2023   HDL 54.70 12/12/2023   CHOLHDL 2 12/12/2023   VLDL 19.6 12/12/2023   LDLCALC 60 12/12/2023   LDLCALC 38 04/25/2023   Lab Results  Component Value Date   TSH 2.29 12/12/2023   TSH 1.79 04/25/2023    Therapeutic Level Labs: No results found for: LITHIUM No results found for: VALPROATE No results found for: CBMZ   Screenings: AIMS    Flowsheet Row [facility] Admission (Discharged) from 08/12/2022 in [facility]  AIMS Total Score 5 2   AUDIT    Flowsheet Row Admission (Discharged) from 08/12/2022 in [facility] BEHAVIORAL MED[facility]  Alcohol Use Disorder Identification Test Final Score (AUDIT) 0   GAD-7    Flowsheet Row Video Visit from 10/13/2022 in [facility]  Care at [facility] Office Visit from 06/30/2022 in [facility] Office Visit from 05/26/2022 in [facility] Primary Care at [facility] Office Visit from 01/16/2017 in [facility] Primary Care at [facility]  Total GAD-7 Score 0 11 11 3    PHQ2-9    Flowsheet Row Clinical Support from 02/13/2024 in [facility] Primary Care at [facility] Office Visit from 01/11/2024 in [facility] Primary Care at [facility] Office Visit from 01/01/2024 in [facility] - A Dept Of [facility] Clinical Support from 07/12/2023 in [facility] Clinical Support from 02/10/2023 in [facility] Primary Care at [facility]  PHQ-2 Total Score 0 0 0 0 0  PHQ-9 Total Score 0 0 -- -- --   Flowsheet Row ED to Hosp-Admission (Discharged) from 07/28/2023 in [place] [facility] 4TH FLOOR PROGRESSIVE CARE AND UROLOGY Admission (Discharged) from 11/04/2022 in [facility] Admission (Discharged) from 10/18/2022 in [facility]  C-SSRS RISK CATEGORY No Risk No Risk No Risk    Collaboration of Care: Collaboration of Care: Medication Management AEB medication prescription, Primary Care Provider AEB chart review, and Other provider involved in patient's care AEB Pulmonology, gastro chart review  Patient/Guardian was advised Release of Information must be obtained prior to any record release in order to collaborate their care with an outside provider. Patient/Guardian was advised if they have not already done so to contact the registration department to sign all necessary forms in order for us  to release information regarding their care.   Consent: Patient/Guardian gives verbal consent for treatment and assignment of benefits for services provided during this visit. Patient/Guardian expressed understanding and agreed to proceed.    Arvella CHRISTELLA Finder, MD 07/05/2024,  10:59 AM   Virtual Visit via Video Note  I connected with [employer] on 07/05/24 at 10:30 AM EST by a video enabled telemedicine application and verified that I am speaking  with the correct person using two identifiers.  Location: Patient: Home Provider: Home Office   I discussed the limitations of evaluation and management by telemedicine and the availability of in person appointments. The patient expressed understanding and agreed to proceed.   I discussed the assessment and treatment plan with the patient. The patient was provided an opportunity to ask questions and all were answered. The patient agreed with the plan and demonstrated an understanding of the instructions.   The patient was advised to call back or seek an in-person evaluation if the symptoms worsen or if the condition fails to improve as anticipated.  I provided 20 minutes of non-face-to-face time during this encounter.   Arvella CHRISTELLA Finder, MD

## 2024-07-03 ENCOUNTER — Other Ambulatory Visit

## 2024-07-03 ENCOUNTER — Ambulatory Visit: Admitting: Nurse Practitioner

## 2024-07-05 ENCOUNTER — Encounter (HOSPITAL_COMMUNITY): Payer: Self-pay | Admitting: Psychiatry

## 2024-07-05 ENCOUNTER — Telehealth (HOSPITAL_COMMUNITY): Admitting: Psychiatry

## 2024-07-05 DIAGNOSIS — T50915A Adverse effect of multiple unspecified drugs, medicaments and biological substances, initial encounter: Secondary | ICD-10-CM | POA: Diagnosis not present

## 2024-07-05 DIAGNOSIS — F3341 Major depressive disorder, recurrent, in partial remission: Secondary | ICD-10-CM | POA: Diagnosis not present

## 2024-07-05 DIAGNOSIS — G2401 Drug induced subacute dyskinesia: Secondary | ICD-10-CM

## 2024-07-05 DIAGNOSIS — F419 Anxiety disorder, unspecified: Secondary | ICD-10-CM

## 2024-07-05 DIAGNOSIS — F411 Generalized anxiety disorder: Secondary | ICD-10-CM

## 2024-07-05 MED ORDER — CLONAZEPAM 0.5 MG PO TABS: 0.5000 mg | ORAL_TABLET | Freq: Every morning | ORAL | 1 refills | Status: AC

## 2024-07-05 MED ORDER — VALBENAZINE TOSYLATE 60 MG PO CAPS: 60.0000 mg | ORAL_CAPSULE | Freq: Every day | ORAL | 2 refills | Status: DC

## 2024-07-09 ENCOUNTER — Telehealth (HOSPITAL_COMMUNITY): Payer: Self-pay

## 2024-07-09 NOTE — Progress Notes (Signed)
 " Patient Care Team: Domenica Harlene LABOR, MD as PCP - General (Family Medicine) Lavona Agent, MD as PCP - Cardiology (Cardiology) Haverstock, Tawni CROME, MD as Referring Physician (Dermatology) Geronimo Amel, MD as Consulting Physician (Pulmonary Disease) Maranda Leim DEL, MD as Consulting Physician (Cardiology) Mat Browning, MD as Consulting Physician (Obstetrics and Gynecology) Roark Rush, MD as Consulting Physician (Otolaryngology) Pyrtle, Gordy HERO, MD as Consulting Physician (Gastroenterology) Tyree Nanetta SAILOR, RN as Oncology Nurse Navigator Curvin Deward MOULD, MD as Consulting Physician (General Surgery) Lanny Callander, MD as Consulting Physician (Hematology) Shannon Agent, MD as Consulting Physician (Radiation Oncology) Cleotilde Elspeth CROME, OD (Optometry) Carvin Arvella HERO, MD as Consulting Physician (Psychiatry)  Clinic Day:  07/10/2024  Referring physician: Domenica Harlene LABOR, MD  ASSESSMENT & PLAN:   Assessment & Plan: Primary malignant neoplasm of lower-outer quadrant of breast, left (HCC) invasive ductal carcinoma, stage Ia, pT1aN0M0, ER+/PR+/HER2-, G1 --We discussed her imaging findings and the biopsy results in great details. -Giving the very early disease, she likely a candidate for lumpectomy. She is agreeable with that. She was seen by Dr. Curvin today and likely will proceed with surgery soon.  Given her age and small primary tumor, it is reasonable to forego sentinel lymph node biopsy. -Given the small tumor, no Oncotype or adjuvant chemotherapy needed.  Given the favorable prognostic factors, this is likely low risk disease. -she underwent left breast lumpectomy on Nov 04, 2022, I reviewed her surgical path. -She completed adjuvant radiation on January 23, 2023. -she started adjuvant tamoxifen  in 01/2023. - Tamoxifen  held starting February 2025 due to development of PE. -Bilateral breast MRI on 10/11/2023 was benign.  Repeat in April 2026. -Diagnostic mammogram expected in  October 2025.  This was ordered as part of today's visit. Continue breast cancer surveillance. Labs and follow-up expected in 6 months, sooner if needed. -3D diagnostic mammogram done on 04/10/2024.  She has breast density category B.  Findings are overall benign.  She is scheduled for breast MRI in April 2026.    B[facility] health DEXA scan done on 03/14/2023.  She currently takes supplement with calcium  and vitamin D .  She is physically active.  This is managed by her GYN provider.  Iron  Deficiency anemia Patient with significant iron  deficiency today.  Hgb 7.8 and HCT 25.5.  Iron  is low at 14 with saturation ratio 3%.  Ferritin is 9.  Treatment plan placed for IV iron  at [facility] infusion center.  She will be contacted to set up appointments for infusions as soon as possible.  She does have upcoming appointment with her GI provider, Dr. Albertus, next week.   Left breast cancer, ER + Currently on surveillance.  Developed PE while on tamoxifen .  Another AI medications caused significant joint pain.  Diagnostic mammogram done in October 2025 with benign category B breast density.  Repeat in 03/2025.  Continue with breast cancer surveillance with labs and follow-up in 6 months.  Plan Labs reviewed. -Significant anemia with Hgb 7.8 and HCT 25.5. -Very low iron  at 14, ferritin 9, saturation 3%. Arrange for IV iron  for [facility] infusion center. GI appointment next week as scheduled.   Breast MRI scheduled for April 2026. Plan for labs and follow up in 6 months, sooner if needed.  The patient understands the plans discussed today and is in agreement with them.  She knows to contact our office if she develops concerns prior to her next appointment.  I provided 30 minutes of face-to-face time during this  encounter and > 50% was spent counseling as documented under my assessment and plan.    [name], NP  [facility] - A DEPT OF [facility]  [facility] [address] [place] [address] Dept: [phone] Dept Fax: [phone]   No orders of the defined types were placed in this encounter.     CHIEF COMPLAINT:  CC: Left cancer, ER +  Current Treatment: Surveillance  INTERVAL HISTORY:  Elvy is here today for repeat clinical assessment.  She last saw me on 01/01/2024.  She had 3D diagnostic mammogram done on 04/10/2024.  She has breast density category B.  Findings are overall benign.  She is scheduled for breast MRI in April 2026. She has severe iron  deficiency anemia.  She has been unable to tolerate oral iron  in the past.  Because significant constipation and abdominal pain.  She denies masses, lumps, or changes in either breast. She denies chest pain, chest pressure, or shortness of breath. She denies headaches or visual disturbances. She denies abdominal pain, nausea, vomiting, or changes in bowel or bladder habits.   She denies fevers or chills. She denies pain. Her appetite is good. Her weight has been stable.  I have reviewed the past medical history, past surgical history, social history and family history with the patient and they are unchanged from previous note.  ALLERGIES:  is allergic to latuda  [lurasidone  hcl], beclomethasone dipropionate, flexeril  [cyclobenzaprine ], mometasone  furo-formoterol fum, sulfonamide derivatives, other, statins, sulfamethoxazole-trimethoprim, tamoxifen , and fluticasone .  MEDICATIONS:  Current Outpatient Medications  Medication Sig Dispense Refill   acetaminophen  (TYLENOL ) 650 MG CR tablet Take 650-1,300 mg by mouth every 8 (eight) hours as needed for pain.     albuterol  (VENTOLIN  HFA) 108 (90 Base) MCG/ACT inhaler Inhale 1-2 puffs into the lungs every 6 (six) hours as needed. 8 g 2   ascorbic acid (VITAMIN C) 500 MG tablet Take 500 mg by mouth every evening.     benralizumab  (FASENRA  PEN) 30 MG/ML prefilled autoinjector Inject 1 mL (30 mg total) into the skin every  8 (eight) weeks. 1 mL 2   Calcium -Magnesium -Vitamin D  (CALCIUM  MAGNESIUM  PO) Take 1 tablet by mouth at bedtime.     clonazePAM  (KLONOPIN ) 0.5 MG tablet Take 1 tablet (0.5 mg total) by mouth in the morning. 30 tablet 1   denosumab -bbdz (JUBBONTI ) 60 MG/ML SOSY injection Inject 60 mg into the skin every 6 (six) months. Dx code: M81.0.  pt has appt on 06/18/24 1 mL 0   doxepin  (SINEQUAN ) 25 MG capsule TAKE 1 CAPSULE (25 MG TOTAL) BY MOUTH AT BEDTIME. 30 capsule 2   ELIQUIS  5 MG TABS tablet TAKE 1 TABLET BY MOUTH 2 TIMES DAILY. 60 tablet 4   famotidine  (PEPCID ) 20 MG tablet TAKE 1 TABLET BY MOUTH TWICE A DAY 60 tablet 1   ipratropium-albuterol  (DUONEB) 0.5-2.5 (3) MG/3ML SOLN Take 3 mLs by nebulization every 6 (six) hours as needed. 120 mL 5   nitroGLYCERIN  (NITROSTAT ) 0.4 MG SL tablet Place 1 tablet (0.4 mg total) under the tongue every 5 (five) minutes as needed for chest pain. 25 tablet 11   pindolol  (VISKEN ) 5 MG tablet Take 1 tablet (5 mg total) by mouth daily after breakfast. 30 tablet 2   REPATHA  SURECLICK 140 MG/ML SOAJ INJECT 140 MG INTO THE SKIN EVERY 14 DAYS. 2 mL 11   risperiDONE  (RISPERDAL ) 0.5 MG tablet Take 0.5 tablets (0.25 mg total) by mouth at bedtime.  30 tablet 2   valbenazine  (INGREZZA ) 60 MG capsule Take 1 capsule (60 mg total) by mouth daily. 30 capsule 2   venlafaxine  XR (EFFEXOR -XR) 75 MG 24 hr capsule Take 3 capsules (225 mg total) by mouth daily with breakfast. 90 capsule 2   budesonide  (ENTOCORT EC ) 3 MG 24 hr capsule Take 3 capsules (9 mg total) by mouth daily for 56 days, THEN 2 capsules (6 mg total) daily for 28 days, THEN 1 capsule (3 mg total) daily for 28 days. 252 capsule 0   Na Sulfate-K Sulfate-Mg Sulfate concentrate (SUPREP BOWEL PREP  KIT) 17.5-3.13-1.6 GM/177ML SOLN Take 1 kit (354 mLs total) by mouth as directed. For colonoscopy prep 354 mL 0   pantoprazole  (PROTONIX ) 40 MG tablet Take 1 tablet (40 mg total) by mouth 2 (two) times daily. 60 tablet 3   Current  Facility-Administered Medications  Medication Dose Route Frequency Provider Last Rate Last Admin   [START ON 12/12/2024] denosumab -bbdz (JUBBONTI ) injection 60 mg  60 mg Subcutaneous Once Domenica Harlene LABOR, MD        HISTORY OF PRESENT ILLNESS:   Oncology History Overview Note   Cancer Staging  Primary malignant neoplasm of lower-outer quadrant of breast, left Staging form: Breast, AJCC 8th Edition - Clinical stage from 10/10/2022: cT1a, cN0, cM0, G1 - Unsigned Stage prefix: Initial diagnosis Method of lymph node assessment: Clinical Histologic grading system: 3 grade system     Primary malignant neoplasm of lower-outer quadrant of breast, left (HCC)  10/10/2022 Initial Diagnosis   Primary malignant neoplasm of lower-outer quadrant of breast, left   11/04/2022 Cancer Staging   Staging form: Breast, AJCC 8th Edition - Pathologic stage from 11/04/2022: Stage Unknown (pT1a, pNX, G1, ER+, PR+, HER2-) - Signed by Lanny Callander, MD on 01/12/2023 Histologic grading system: 3 grade system Residual tumor (R): R0 - None   11/04/2022 Surgery   LEFT BREAST LUMPECTOMY WITH RADIOACTIVE SEED LOCALIZATION   Surgeon:Toth, Deward MOULD, MD    11/04/2022 Pathology Results    FINAL MICROSCOPIC DIAGNOSIS:  A. BREAST, LEFT, LUMPECTOMY:      Invasive ductal carcinoma, 0.3 cm, grade 1 Ductal carcinoma in situ:  Not identified Margins, invasive:  Negative           Closest, invasive:  Posterior margin Margins, DCIS:  NA           Closest, DCIS:  NA Lymphovascular invasion:  Not identified Prognostic markers: ER: 100%, Positive, strong staining intensity PR: 100%, Positive, strong staining intensity Her2: Negative, 0 Ki-67: 5% Other:  Sclerosing adenosis, microcysts, dilated mammary ducts      Biopsy sites and clips identified See oncology table  B. BREAST, LEFT LATERAL MARGIN, EXCISION:      Benign breast tissue with sclerosing adenosis and microcysts.      Negative for malignancy.  C. BREAST, LEFT  ANTERIOR MARGIN, EXCISION:      Benign breast tissue with sclerosing adenosis, microcysts, apocrine metaplasia, dilated mammary ducts.      Negative for malignancy.    10/11/2023 Imaging   Bilateral breast MRI IMPRESSION: 1. No MR evidence of breast malignancy. 2. LEFT lumpectomy changes.  RECOMMENDATION: Bilateral diagnostic mammogram in 6 months to resume annual mammogram schedule.   Bilateral screening breast MRI in 1 year in this high risk patient.   BI-RADS CATEGORY  2: Benign.       REVIEW OF SYSTEMS:   Constitutional: Denies fevers, chills or abnormal weight loss Eyes: Denies blurriness of vision Ears, nose, mouth, throat, and  face: Denies mucositis or sore throat Respiratory: Denies cough, dyspnea or wheezes Cardiovascular: Denies palpitation, chest discomfort or lower extremity swelling Gastrointestinal:  Denies nausea, heartburn or change in bowel habits Skin: Denies abnormal skin rashes Lymphatics: Denies new lymphadenopathy or easy bruising Neurological:Denies numbness, tingling or new weaknesses Behavioral/Psych: Mood is stable, no new changes  All other systems were reviewed with the patient and are negative.   VITALS:   Today's Vitals   07/10/24 1000  BP: 106/64  Pulse: 68  Resp: 18  Temp: 97.6 F (36.4 C)  TempSrc: Temporal  SpO2: 100%  Weight: 168 lb 12.8 oz (76.6 kg)  PainSc: 0-No pain   Body mass index is 31.89 kg/m.   Wt Readings from Last 3 Encounters:  07/11/24 167 lb 6 oz (75.9 kg)  07/10/24 168 lb 12.8 oz (76.6 kg)  04/29/24 165 lb 12.8 oz (75.2 kg)    Body mass index is 31.89 kg/m.  Performance status (ECOG): 1 - Symptomatic but completely ambulatory  PHYSICAL EXAM:   GENERAL:alert, no distress and comfortable SKIN: skin color, texture, turgor are normal, no rashes or significant lesions EYES: normal, Conjunctiva are pink and non-injected, sclera clear OROPHARYNX:no exudate, no erythema and lips, buccal mucosa, and tongue normal   NECK: supple, thyroid  normal size, non-tender, without nodularity LYMPH:  no palpable lymphadenopathy in the cervical, axillary or inguinal LUNGS: clear to auscultation and percussion with normal breathing effort HEART: regular rate & rhythm and no murmurs and no lower extremity edema ABDOMEN:abdomen soft, non-tender and normal bowel sounds Musculoskeletal:no cyanosis of digits and no clubbing  NEURO: alert & oriented x 3 with fluent speech, no focal motor/sensory deficits BREAST: there is well healed lumpectomy scar across the  lower, outer quadrant of the left breast. No palpable masses or lumps are noted today. There is no nipple inversion or nipple discharge. There is no axillary lymphadenopathy on the left.  There are no palpable masses or lumps in the right breast.  There is no nipple inversion or nipple discharge.  There is no axillary lymphadenopathy on the right.   LABORATORY DATA:  I have reviewed the data as listed    Component Value Date/Time   NA 136 07/10/2024 1039   NA 135 09/23/2021 1321   K 4.1 07/10/2024 1039   CL 99 07/10/2024 1039   CO2 28 07/10/2024 1039   GLUCOSE 97 07/10/2024 1039   BUN 16 07/10/2024 1039   BUN 21 09/23/2021 1321   CREATININE 0.66 07/10/2024 1039   CREATININE 0.59 08/17/2023 1137   CALCIUM  9.2 07/10/2024 1039   PROT 6.5 07/10/2024 1039   PROT 6.3 09/23/2021 1321   ALBUMIN  4.1 07/10/2024 1039   ALBUMIN  4.5 09/23/2021 1321   AST 23 07/10/2024 1039   ALT 17 07/10/2024 1039   ALKPHOS 85 07/10/2024 1039   BILITOT 0.2 07/10/2024 1039   GFRNONAA >60 07/10/2024 1039   GFRAA >60 08/24/2017 0349    Lab Results  Component Value Date   WBC 4.7 07/10/2024   NEUTROABS 3.3 07/10/2024   HGB 7.8 (L) 07/10/2024   HCT 25.5 (L) 07/10/2024   MCV 75.7 (L) 07/10/2024   PLT 353 07/10/2024     "

## 2024-07-09 NOTE — Assessment & Plan Note (Signed)
 invasive ductal carcinoma, stage Ia, pT1aN0M0, ER+/PR+/HER2-, G1 --We discussed her imaging findings and the biopsy results in great details. -Giving the very early disease, she likely a candidate for lumpectomy. She is agreeable with that. She was seen by Dr. Curvin today and likely will proceed with surgery soon.  Given her age and small primary tumor, it is reasonable to forego sentinel lymph node biopsy. -Given the small tumor, no Oncotype or adjuvant chemotherapy needed.  Given the favorable prognostic factors, this is likely low risk disease. -she underwent left breast lumpectomy on Nov 04, 2022, I reviewed her surgical path. -She completed adjuvant radiation on January 23, 2023. -she started adjuvant tamoxifen  in 01/2023. - Tamoxifen  held starting February 2025 due to development of PE. -Bilateral breast MRI on 10/11/2023 was benign.  Repeat in April 2026. -Diagnostic mammogram expected in October 2025.  This was ordered as part of today's visit. Continue breast cancer surveillance. Labs and follow-up expected in 6 months, sooner if needed. -3D diagnostic mammogram done on 04/10/2024.  She has breast density category B.  Findings are overall benign.  She is scheduled for breast MRI in April 2026.

## 2024-07-09 NOTE — Telephone Encounter (Signed)
 Fax received for a prior auth on patients [employer] . This was approved until 07/09/2025

## 2024-07-10 ENCOUNTER — Inpatient Hospital Stay: Admitting: Nurse Practitioner

## 2024-07-10 ENCOUNTER — Other Ambulatory Visit (HOSPITAL_COMMUNITY): Payer: Self-pay | Admitting: Nurse Practitioner

## 2024-07-10 ENCOUNTER — Inpatient Hospital Stay: Attending: Nurse Practitioner

## 2024-07-10 VITALS — BP 106/64 | HR 68 | Temp 97.6°F | Resp 18 | Wt 168.8 lb

## 2024-07-10 DIAGNOSIS — Z1732 Human epidermal growth factor receptor 2 negative status: Secondary | ICD-10-CM | POA: Diagnosis not present

## 2024-07-10 DIAGNOSIS — Z1721 Progesterone receptor positive status: Secondary | ICD-10-CM | POA: Diagnosis not present

## 2024-07-10 DIAGNOSIS — Z923 Personal history of irradiation: Secondary | ICD-10-CM | POA: Diagnosis not present

## 2024-07-10 DIAGNOSIS — D509 Iron deficiency anemia, unspecified: Secondary | ICD-10-CM | POA: Diagnosis not present

## 2024-07-10 DIAGNOSIS — Z79810 Long term (current) use of selective estrogen receptor modulators (SERMs): Secondary | ICD-10-CM | POA: Insufficient documentation

## 2024-07-10 DIAGNOSIS — Z79899 Other long term (current) drug therapy: Secondary | ICD-10-CM | POA: Insufficient documentation

## 2024-07-10 DIAGNOSIS — C50512 Malignant neoplasm of lower-outer quadrant of left female breast: Secondary | ICD-10-CM

## 2024-07-10 DIAGNOSIS — Z170 Estrogen receptor positive status [ER+]: Secondary | ICD-10-CM | POA: Insufficient documentation

## 2024-07-10 LAB — CMP (CANCER CENTER ONLY)
ALT: 17 U/L (ref 0–44)
AST: 23 U/L (ref 15–41)
Albumin: 4.1 g/dL (ref 3.5–5.0)
Alkaline Phosphatase: 85 U/L (ref 38–126)
Anion gap: 10 (ref 5–15)
BUN: 16 mg/dL (ref 8–23)
CO2: 28 mmol/L (ref 22–32)
Calcium: 9.2 mg/dL (ref 8.9–10.3)
Chloride: 99 mmol/L (ref 98–111)
Creatinine: 0.66 mg/dL (ref 0.44–1.00)
GFR, Estimated: >60 mL/min
Glucose, Bld: 97 mg/dL (ref 70–99)
Potassium: 4.1 mmol/L (ref 3.5–5.1)
Sodium: 136 mmol/L (ref 135–145)
Total Bilirubin: 0.2 mg/dL (ref 0.0–1.2)
Total Protein: 6.5 g/dL (ref 6.5–8.1)

## 2024-07-10 LAB — CBC WITH DIFFERENTIAL (CANCER CENTER ONLY)
Abs Immature Granulocytes: 0.01 K/uL (ref 0.00–0.07)
Basophils Absolute: 0 K/uL (ref 0.0–0.1)
Basophils Relative: 0 %
Eosinophils Absolute: 0 K/uL (ref 0.0–0.5)
Eosinophils Relative: 0 %
HCT: 25.5 % — ABNORMAL LOW (ref 36.0–46.0)
Hemoglobin: 7.8 g/dL — ABNORMAL LOW (ref 12.0–15.0)
Immature Granulocytes: 0 %
Lymphocytes Relative: 18 %
Lymphs Abs: 0.8 K/uL (ref 0.7–4.0)
MCH: 23.1 pg — ABNORMAL LOW (ref 26.0–34.0)
MCHC: 30.6 g/dL (ref 30.0–36.0)
MCV: 75.7 fL — ABNORMAL LOW (ref 80.0–100.0)
Monocytes Absolute: 0.6 K/uL (ref 0.1–1.0)
Monocytes Relative: 12 %
Neutro Abs: 3.3 K/uL (ref 1.7–7.7)
Neutrophils Relative %: 70 %
Platelet Count: 353 K/uL (ref 150–400)
RBC: 3.37 MIL/uL — ABNORMAL LOW (ref 3.87–5.11)
RDW: 15.9 % — ABNORMAL HIGH (ref 11.5–15.5)
WBC Count: 4.7 K/uL (ref 4.0–10.5)
nRBC: 0 % (ref 0.0–0.2)

## 2024-07-10 LAB — IRON AND IRON BINDING CAPACITY (CC-WL,HP ONLY)
Iron: 14 ug/dL — ABNORMAL LOW (ref 28–170)
Saturation Ratios: 3 % — ABNORMAL LOW (ref 10.4–31.8)
TIBC: 423 ug/dL (ref 250–450)
UIBC: 409 ug/dL

## 2024-07-10 LAB — FERRITIN: Ferritin: 9 ng/mL — ABNORMAL LOW (ref 11–307)

## 2024-07-11 ENCOUNTER — Ambulatory Visit: Admitting: Gastroenterology

## 2024-07-11 ENCOUNTER — Telehealth: Payer: Self-pay | Admitting: Pharmacy Technician

## 2024-07-11 ENCOUNTER — Encounter: Payer: Self-pay | Admitting: Gastroenterology

## 2024-07-11 VITALS — BP 98/64 | HR 76 | Ht 60.0 in | Wt 167.4 lb

## 2024-07-11 DIAGNOSIS — K52832 Lymphocytic colitis: Secondary | ICD-10-CM | POA: Diagnosis not present

## 2024-07-11 DIAGNOSIS — Z7901 Long term (current) use of anticoagulants: Secondary | ICD-10-CM | POA: Diagnosis not present

## 2024-07-11 DIAGNOSIS — D509 Iron deficiency anemia, unspecified: Secondary | ICD-10-CM | POA: Diagnosis not present

## 2024-07-11 DIAGNOSIS — R143 Flatulence: Secondary | ICD-10-CM

## 2024-07-11 DIAGNOSIS — J8283 Eosinophilic asthma: Secondary | ICD-10-CM

## 2024-07-11 DIAGNOSIS — I25118 Atherosclerotic heart disease of native coronary artery with other forms of angina pectoris: Secondary | ICD-10-CM

## 2024-07-11 DIAGNOSIS — I2699 Other pulmonary embolism without acute cor pulmonale: Secondary | ICD-10-CM | POA: Diagnosis not present

## 2024-07-11 DIAGNOSIS — I5032 Chronic diastolic (congestive) heart failure: Secondary | ICD-10-CM

## 2024-07-11 DIAGNOSIS — K449 Diaphragmatic hernia without obstruction or gangrene: Secondary | ICD-10-CM

## 2024-07-11 MED ORDER — PANTOPRAZOLE SODIUM 40 MG PO TBEC: 40.0000 mg | DELAYED_RELEASE_TABLET | Freq: Two times a day (BID) | ORAL | 3 refills | Status: AC

## 2024-07-11 MED ORDER — NA SULFATE-K SULFATE-MG SULF 17.5-3.13-1.6 GM/177ML PO SOLN: 1.0000 | ORAL | 0 refills | Status: AC

## 2024-07-11 MED ORDER — BUDESONIDE 3 MG PO CPEP: ORAL_CAPSULE | ORAL | 0 refills | Status: AC

## 2024-07-11 NOTE — Progress Notes (Signed)
 "  Chief Complaint: IDA, lymphocytic colitis Primary GI MD: Dr. Albertus  HPI: 69 year old female history of lymphocytic colitis that is responsive to budesonide , GERD with hiatal hernia, Schatzki's ring with dilation in 2016, colonic diverticulosis with history of diverticulitis, history of pulmonary aspergillosis with eosinophilic asthma and bronchiectasis, anxiety and depression    Discussed the use of AI scribe software for clinical note transcription with the patient, who gave verbal consent to proceed.  History of Present Illness  Victoria [name] is a 69 year old female with lymphocytic colitis, iron  deficiency anemia, and large hiatal hernia who presents for evaluation of persistent diarrhea and iron  deficiency anemia.  She has a complex gastrointestinal history, including lymphocytic colitis previously treated with budesonide , iron  deficiency anemia requiring iron  infusions, and a large hiatal hernia (7 cm) identified on prior endoscopy. Additional history includes prior breast cancer treated with radiation and a pulmonary embolism in February 2025, for which she has been on Eliquis  with plans to discontinue after one year. Her most recent colonoscopy in 2023 for diarrhea revealed a few erosions in the colon. C  Ongoing loose, mushy, and unformed stools prompted this appointment. Nightly use of Imodium has resulted in more regular, formed stools. She denies abdominal pain but reports significant flatulence, which is socially concerning, especially with upcoming travel. Diet includes fish, chicken, and vegetables such as broccoli. She expresses interest in resuming budesonide , as it was previously effective. No current symptoms suggestive of infection are present.  Iron  deficiency anemia has worsened over the past year. Recent laboratory results show hemoglobin 7.8, ferritin 9, and iron  saturation 3%. She has not been taking oral iron  supplements and receives iron  infusions, with the last  infusion in late winter or spring of the previous year. No overt bleeding has been noted, but stools have appeared a little more blackish than brown on a few occasions. No abdominal pain or overt gastrointestinal bleeding has occurred.  Patient would like to wait and schedule her procedures in March after she has an upcoming trip to [place]    PREVIOUS GI WORKUP   Last colonoscopy is up-to-date, October 2023.   Past Medical History:  Diagnosis Date   Allergic bronchopulmonary aspergillosis (HCC) 2008   sees Dr Katheren Finn pulmonology   Anemia    iron  deficiency, resolved   Anxiety    Asthma    Breast cancer (HCC)    CAD (coronary artery disease)    a. LHC 6/16:  oOM1 60, pRCA 25 >> med Rx b. cath 3/19 2nd OM with 95% stenosis s/p synergy DES & anomalous RCA   CAP (community acquired pneumonia) 2016; 06/07/2016   Chronic bronchitis (HCC)    Chronic lower back pain    Clotting disorder 07/04/2020   maroon blood in stools - 3 episodes in two hours   Complication of anesthesia    think I have a hard time waking up from it   COPD (chronic obstructive pulmonary disease) (HCC)    Depression    mild   Diverticulitis    Diverticulosis    GERD (gastroesophageal reflux disease)    H/O hiatal hernia    Headache    weekly (08/23/2017)   History of echocardiogram    Echo 6/16:  Mod LVH, EF 60-65%, no RWMA, Gr 1 DD, trivial MR, normal LA size.   History of radiation therapy    Left breast- 12/26/22-01/23/23-Dr. Lynwood Nasuti   Hyperglycemia 11/20/2015   Hyperlipidemia, mixed 09/11/2007   Qualifier: Diagnosis of  By: Antonio  DO, Jamee   Did not tolerate Lipitor, zocor, Lovastatin, Pravastatin, Livalo , Crestor  even low dose    IBS (irritable bowel syndrome)    Maxillary sinusitis    Normal cardiac stress test 11/2011   No evidence of ischemia or infarct.   Calculated ejection fraction 72%.   Obesity    OSA (obstructive sleep apnea) 02/2012   has stopped using  cpap    Osteoarthritis    Osteoporosis    Personal history of radiation therapy    Pneumonia 11/2011   before 2013 I hadn't had pneumonia since I was a child (04/13/2012)   Pulmonary nodules    S/P angioplasty with stent 08/23/17 ostial 2nd OM with DES synnergy 08/24/2017   Schatzki's ring     Past Surgical History:  Procedure Laterality Date   ANTERIOR AND POSTERIOR REPAIR N/A 10/18/2022   Procedure: ANTERIOR (CYSTOCELE);  Surgeon: Mat Browning, MD;  Location: [facility] OR;  Service: Gynecology;  Laterality: N/A;   APPENDECTOMY  1989   BREAST BIOPSY  11/03/2022   MM LT RADIOACTIVE SEED LOC MAMMO GUIDE 11/03/2022 [facility] MAMMOGRAPHY   BREAST LUMPECTOMY WITH RADIOACTIVE SEED LOCALIZATION Left 11/04/2022   Procedure: LEFT BREAST LUMPECTOMY WITH RADIOACTIVE SEED LOCALIZATION;  Surgeon: Curvin Deward MOULD, MD;  Location: [facility];  Service: General;  Laterality: Left;   CARDIAC CATHETERIZATION N/A 11/25/2014   Procedure: Right/Left Heart Cath and Coronary Angiography;  Surgeon: Victory LELON Sharps, MD;  [facility] INVAS[facility] CV LAB;  Service: Cardiovascular;  Laterality: N/A;   CESAREAN SECTION  1985   COLONOSCOPY  03/2022   CORONARY ANGIOPLASTY WITH STENT PLACEMENT  08/23/2017   CORONARY STENT INTERVENTION N/A 08/23/2017   Procedure: CORONARY STENT INTERVENTION;  Surgeon: Verlin Lonni BIRCH, MD;  Location: [facility] INVASIVE CV LAB;  Service: Cardiovascular;  Laterality: N/A;   HERNIA REPAIR  04/13/2012   VHR laparoscopic   LAPAROSCOPIC BILATERAL SALPINGO OOPHERECTOMY Bilateral 10/18/2022   Procedure: LAPAROTOMY WITH BILATERAL SALPINGO OOPHORECTOMY;  Surgeon: Mat Browning, MD;  Location: [facility] OR;  Service: Gynecology;  Laterality: Bilateral;   LEFT HEART CATH AND CORONARY ANGIOGRAPHY N/A 08/23/2017   Procedure: LEFT HEART CATH AND CORONARY ANGIOGRAPHY;  Surgeon: Verlin Lonni BIRCH, MD;  Location: [facility] INVASIVE CV LAB;  Service: Cardiovascular;  Laterality: N/A;   VAGINAL HYSTERECTOMY N/A  10/18/2022   Procedure: HYSTERECTOMY VAGINAL;  Surgeon: Mat Browning, MD;  Location: [facility] OR;  Service: Gynecology;  Laterality: N/A;   VENTRAL HERNIA REPAIR  04/13/2012   Procedure: LAPAROSCOPIC VENTRAL HERNIA;  Surgeon: Elon CHRISTELLA Pacini, MD;  Location: [facility] OR;  Service: General;  Laterality: N/A;  laparoscopic repair of incarcerated hernia    Current Outpatient Medications  Medication Sig Dispense Refill   acetaminophen  (TYLENOL ) 650 MG CR tablet Take 650-1,300 mg by mouth every 8 (eight) hours as needed for pain.     albuterol  (VENTOLIN  HFA) 108 (90 Base) MCG/ACT inhaler Inhale 1-2 puffs into the lungs every 6 (six) hours as needed. 8 g 2   ascorbic acid (VITAMIN C) 500 MG tablet Take 500 mg by mouth every evening.     benralizumab  (FASENRA  PEN) 30 MG/ML prefilled autoinjector Inject 1 mL (30 mg total) into the skin every 8 (eight) weeks. 1 mL 2   budesonide  (ENTOCORT EC ) 3 MG 24 hr capsule Take 3 capsules (9 mg total) by mouth daily for 56 days, THEN 2 capsules (6 mg total) daily for 28 days, THEN 1 capsule (3 mg total) daily for 28 days. 252 capsule 0   Calcium -Magnesium -Vitamin  D (CALCIUM  MAGNESIUM  PO) Take 1 tablet by mouth at bedtime.     clonazePAM  (KLONOPIN ) 0.5 MG tablet Take 1 tablet (0.5 mg total) by mouth in the morning. 30 tablet 1   denosumab -bbdz (JUBBONTI ) 60 MG/ML SOSY injection Inject 60 mg into the skin every 6 (six) months. Dx code: M81.0.  pt has appt on 06/18/24 1 mL 0   doxepin  (SINEQUAN ) 25 MG capsule TAKE 1 CAPSULE (25 MG TOTAL) BY MOUTH AT BEDTIME. 30 capsule 2   ELIQUIS  5 MG TABS tablet TAKE 1 TABLET BY MOUTH 2 TIMES DAILY. 60 tablet 4   famotidine  (PEPCID ) 20 MG tablet TAKE 1 TABLET BY MOUTH TWICE A DAY 60 tablet 1   ipratropium-albuterol  (DUONEB) 0.5-2.5 (3) MG/3ML SOLN Take 3 mLs by nebulization every 6 (six) hours as needed. 120 mL 5   Na Sulfate-K Sulfate-Mg Sulfate concentrate (SUPREP BOWEL PREP  KIT) 17.5-3.13-1.6 GM/177ML SOLN Take 1 kit (354 mLs total) by  mouth as directed. For colonoscopy prep 354 mL 0   nitroGLYCERIN  (NITROSTAT ) 0.4 MG SL tablet Place 1 tablet (0.4 mg total) under the tongue every 5 (five) minutes as needed for chest pain. 25 tablet 11   pantoprazole  (PROTONIX ) 40 MG tablet Take 1 tablet (40 mg total) by mouth 2 (two) times daily. 60 tablet 3   pindolol  (VISKEN ) 5 MG tablet Take 1 tablet (5 mg total) by mouth daily after breakfast. 30 tablet 2   REPATHA  SURECLICK 140 MG/ML SOAJ INJECT 140 MG INTO THE SKIN EVERY 14 DAYS. 2 mL 11   risperiDONE  (RISPERDAL ) 0.5 MG tablet Take 0.5 tablets (0.25 mg total) by mouth at bedtime. 30 tablet 2   valbenazine  (INGREZZA ) 60 MG capsule Take 1 capsule (60 mg total) by mouth daily. 30 capsule 2   venlafaxine  XR (EFFEXOR -XR) 75 MG 24 hr capsule Take 3 capsules (225 mg total) by mouth daily with breakfast. 90 capsule 2   Current Facility-Administered Medications  Medication Dose Route Frequency Provider Last Rate Last Admin   [START ON 12/12/2024] denosumab -bbdz (JUBBONTI ) injection 60 mg  60 mg Subcutaneous Once Domenica Harlene LABOR, MD        Allergies as of 07/11/2024 - Review Complete 07/11/2024  Allergen Reaction Noted   Latuda  [lurasidone  hcl] Other (See Comments) 08/08/2022   Beclomethasone dipropionate Hives and Other (See Comments) 08/11/2009   Flexeril  [cyclobenzaprine ] Anxiety 01/10/2020   Mometasone  furo-formoterol fum Hives and Other (See Comments) 07/14/2009   Sulfonamide derivatives Hives and Rash    Other  06/05/2020   Statins Other (See Comments) 07/12/2013   Sulfamethoxazole-trimethoprim  04/29/2024   Tamoxifen  Other (See Comments) 04/29/2024   Fluticasone  Rash and Other (See Comments) 07/29/2023    Family History  Problem Relation Age of Onset   Breast cancer Mother 17       bilateral breast cancer dx. 58, met to liver   Hypertension Mother    Diabetes Mother    Cancer Mother    Diverticulosis Father    Prostate cancer Father    Breast cancer Sister 82       DCIS at  88, IDC at 60, PALB2+   Cancer Sister        breast cancer, invasive ductal carcinoma in 2022,DCIS at 76 with 4 weeks of radiation, 5 years of Tamoxifen     Depression Sister    Hyperlipidemia Sister    Pulmonary embolism Brother        recurrent   Heart attack Maternal Grandfather    Ovarian cancer Paternal Grandmother  Cerebral palsy Son    Prostate cancer Paternal Uncle    Breast cancer Niece 32       PALB2+   Osteoporosis Niece    Stroke Neg Hx    Colon cancer Neg Hx    Esophageal cancer Neg Hx    Stomach cancer Neg Hx    Rectal cancer Neg Hx     Social History   Socioeconomic History   Marital status: Married    Spouse name: Not on file   Number of children: 1   Years of education: 14   Highest education level: Associate degree: academic program  Occupational History   Occupation: Disabled   Tobacco Use   Smoking status: Never    Passive exposure: Never   Smokeless tobacco: Never  Vaping Use   Vaping status: Never Used  Substance and Sexual Activity   Alcohol use: Yes    Alcohol/week: 4.0 - 7.0 standard drinks of alcohol    Types: 4 - 7 Standard drinks or equivalent per week   Drug use: No   Sexual activity: Yes    Birth control/protection: Surgical    Comment: gluten free, lives with husband and son with CP quadriplegia  Other Topics Concern   Not on file  Social History Narrative   Cares for son with cerebral palsy.    Lives at home with her husband and son.   Right-handed.   2 cups caffeine per day.   One story home   Social Drivers of Health   Tobacco Use: Low Risk (07/11/2024)   Patient History    Smoking Tobacco Use: Never    Smokeless Tobacco Use: Never    Passive Exposure: Never  Financial Resource Strain: Low Risk (01/18/2023)   Overall Financial Resource Strain (CARDIA)    Difficulty of Paying Living Expenses: Not hard at all  Food Insecurity: No Food Insecurity (02/13/2024)   Epic    Worried About Radiation Protection Practitioner of Food in the Last Year:  Never true    Ran Out of Food in the Last Year: Never true  Transportation Needs: No Transportation Needs (02/13/2024)   Epic    Lack of Transportation (Medical): No    Lack of Transportation (Non-Medical): No  Physical Activity: Inactive (02/13/2024)   Exercise Vital Sign    Days of Exercise per Week: 0 days    Minutes of Exercise per Session: 0 min  Stress: No Stress Concern Present (02/13/2024)   [employer] of Occupational Health - Occupational Stress Questionnaire    Feeling of Stress: Only a little  Social Connections: Socially Integrated (02/13/2024)   Social Connection and Isolation Panel    Frequency of Communication with Friends and Family: More than three times a week    Frequency of Social Gatherings with Friends and Family: Twice a week    Attends Religious Services: More than 4 times per year    Active Member of Clubs or Organizations: Yes    Attends Banker Meetings: More than 4 times per year    Marital Status: Married  Catering Manager Violence: Not At Risk (02/13/2024)   Epic    Fear of Current or Ex-Partner: No    Emotionally Abused: No    Physically Abused: No    Sexually Abused: No  Depression (PHQ2-9): Low Risk (07/10/2024)   Depression (PHQ2-9)    PHQ-2 Score: 0  Alcohol Screen: Low Risk (02/13/2024)   Alcohol Screen    Last Alcohol Screening Score (AUDIT): 0  Housing: Unknown (02/13/2024)  Epic    Unable to Pay for Housing in the Last Year: No    Number of Times Moved in the Last Year: Not on file    Homeless in the Last Year: No  Utilities: Not At Risk (02/13/2024)   Epic    Threatened with loss of utilities: No  Health Literacy: Adequate Health Literacy (02/13/2024)   B1300 Health Literacy    Frequency of need for help with medical instructions: Never    Review of Systems:    Constitutional: No weight loss, fever, chills, weakness or fatigue HEENT: Eyes: No change in vision               Ears, Nose, Throat:  No change in hearing or  congestion Skin: No rash or itching Cardiovascular: No chest pain, chest pressure or palpitations   Respiratory: No SOB or cough Gastrointestinal: See HPI and otherwise negative Genitourinary: No dysuria or change in urinary frequency Neurological: No headache, dizziness or syncope Musculoskeletal: No new muscle or joint pain Hematologic: No bleeding or bruising Psychiatric: No history of depression or anxiety    Physical Exam:  Vital signs: BP 98/64 (BP Location: Left Arm, Patient Position: Sitting, Cuff Size: Normal)   Pulse 76   Ht 5' (1.524 m) Comment: height measured without shoes  Wt 167 lb 6 oz (75.9 kg)   BMI 32.69 kg/m   Constitutional: NAD, alert and cooperative Head:  Normocephalic and atraumatic. Eyes:   PEERL, EOMI. No icterus. Conjunctiva pink. Respiratory: Respirations even and unlabored. Lungs clear to auscultation bilaterally.   No wheezes, crackles, or rhonchi.  Cardiovascular:  Regular rate and rhythm. No peripheral edema, cyanosis or pallor.  Gastrointestinal:  Soft, nondistended, nontender. No rebound or guarding. Normal bowel sounds. No appreciable masses or hepatomegaly. Rectal:  Declines Msk:  Symmetrical without gross deformities. Without edema, no deformity or joint abnormality.  Neurologic:  Alert and  oriented x4;  grossly normal neurologically.  Skin:   Dry and intact without significant lesions or rashes. Psychiatric: Oriented to person, place and time. Demonstrates good judgement and reason without abnormal affect or behaviors.  Physical Exam    RELEVANT LABS AND IMAGING: CBC    Component Value Date/Time   WBC 4.7 07/10/2024 1039   WBC 4.4 12/12/2023 1013   RBC 3.37 (L) 07/10/2024 1039   HGB 7.8 (L) 07/10/2024 1039   HGB 12.9 09/23/2021 1321   HCT 25.5 (L) 07/10/2024 1039   HCT 38.7 09/23/2021 1321   PLT 353 07/10/2024 1039   PLT 303 09/23/2021 1321   MCV 75.7 (L) 07/10/2024 1039   MCV 90 09/23/2021 1321   MCH 23.1 (L) 07/10/2024 1039    MCHC 30.6 07/10/2024 1039   RDW 15.9 (H) 07/10/2024 1039   RDW 13.1 09/23/2021 1321   LYMPHSABS 0.8 07/10/2024 1039   LYMPHSABS 1.3 09/23/2021 1321   MONOABS 0.6 07/10/2024 1039   EOSABS 0.0 07/10/2024 1039   EOSABS 0.0 09/23/2021 1321   BASOSABS 0.0 07/10/2024 1039   BASOSABS 0.0 09/23/2021 1321    CMP     Component Value Date/Time   NA 136 07/10/2024 1039   NA 135 09/23/2021 1321   K 4.1 07/10/2024 1039   CL 99 07/10/2024 1039   CO2 28 07/10/2024 1039   GLUCOSE 97 07/10/2024 1039   BUN 16 07/10/2024 1039   BUN 21 09/23/2021 1321   CREATININE 0.66 07/10/2024 1039   CREATININE 0.59 08/17/2023 1137   CALCIUM  9.2 07/10/2024 1039   PROT 6.5 07/10/2024 1039  PROT 6.3 09/23/2021 1321   ALBUMIN  4.1 07/10/2024 1039   ALBUMIN  4.5 09/23/2021 1321   AST 23 07/10/2024 1039   ALT 17 07/10/2024 1039   ALKPHOS 85 07/10/2024 1039   BILITOT 0.2 07/10/2024 1039   GFRNONAA >60 07/10/2024 1039   GFRAA >60 08/24/2017 0349     Assessment/Plan:   69 year old female history of lymphocytic colitis that is responsive to budesonide , GERD with hiatal hernia, Schatzki's ring with dilation in 2016, colonic diverticulosis with history of diverticulitis, history of pulmonary aspergillosis with eosinophilic asthma and bronchiectasis, large hiatal hernia, PE on Eliquis , CAD, anxiety and depression presents for evaluation of iron  deficiency anemia and diarrhea  Iron  deficiency anemia Hgb 7.8 (decreased from 11.5 - 6  months ago), MCV 75.7.  Ferritin 9.  Iron  saturation 3%. Iron  14. Normal BUN Last colonoscopy 2023 with erosions, EGD 2021 which showed 7 cm hiatal hernia possibly likely contributing to her iron  deficiency anemia  Anemia has been present for the last year likely in the setting of anticoagulation use of Eliquis  for PE 07/2023 scheduled to be discontinued next month. Although she is up-to-date on her colonoscopy and hiatal hernia in the setting of blood thinner is the likely cause without  any obvious overt bleeding since it has been 3 years and she did not have anemia at the time of her last colonoscopy it is best to evaluate with both EGD and colonoscopy - EGD/colonoscopy for further evaluation - Pantoprazole  40 mg twice daily - I thoroughly discussed the procedure with the patient (at bedside) to include nature of the procedure, alternatives, benefits, and risks (including but not limited to bleeding, infection, perforation, anesthesia/cardiac pulmonary complications).  Patient verbalized understanding and gave verbal consent to proceed with procedure.  -- Patient request to have this done in March - Recheck labs every 2 weeks - Referred to hematology for iron  infusion as she previously failed oral iron  supplementation - Given ED precautions of chest pain and shortness of breath, dizziness, etc  Lymphocytic colitis Previously responded well to budesonide  in the past. Has not been on it since 2024.  Having loose stools and increased gas similar to previous episodes. - Rule out infection with GI pathogen panel C. difficile - If infection is negative can do budesonide  9 mg x 4 weeks, 6 mg x 4 weeks, and then 3 mg x 4 weeks - recommended that the patient avoid all NSAIDs as this may exacerbate a flare.    Excess flatulence Low FODMAP diet  Pulmonary embolism On Eliquis  since 07/2023  CAD S/p angioplasty with stent 08/2017 Echo with adequate ejection fraction,  Breast cancer S/p radiation with IV  Allergic bronchopulmonary aspergillosis  COPD not on oxygen   Hymen Arnett Mollie RIGGERS [facility] 07/11/2024, 3:17 PM  Cc: Domenica Harlene LABOR, MD "

## 2024-07-11 NOTE — Telephone Encounter (Signed)
" °  Victoria [name], the patient will be scheduled as soon as possible.    Auth Submission: NO AUTH NEEDED Site of care: Site of care: [facility] Payer: [employer] Medication & CPT/J Code(s) submitted: Venofer  (Iron  Sucrose) J1756 Diagnosis Code: D50.0 Route of submission (phone, fax, portal): n/a Phone # Fax # Auth type: Buy/Bill PB Units/visits requested: 200 mg x 5 Reference number: n/a Approval from: 07/11/2024 to 10/17/2024        "

## 2024-07-11 NOTE — Telephone Encounter (Signed)
" ° °  Pharmacy Patient Advocate Encounter   Received notification from [facility] that prior authorization for REPATHA  is required/requested.   Insurance verification completed.   The patient is insured through [facility] ADVANTAGE/RX ADVANCE.   Per test claim: PA required; PA submitted to above mentioned insurance via Latent Key/confirmation #/EOC [facility] Status is pending  "

## 2024-07-11 NOTE — Patient Instructions (Addendum)
 We have sent the following medications to your pharmacy for you to pick up at your convenience: Pantoprazole , Suprep, Budesonide    Budesonide - DO NOT START UNTIL AFTER YOU HAVE COMPLETED STOOL STUDIES.  Your provider has requested that you go to the basement level for lab work before leaving today ( stool kit only) . Press B on the elevator. The lab is located at the first door on the left as you exit the elevator.  Your provider has requested that you go to the basement level for lab work before in 2 weeks(07/25/24) Press B on the elevator. The lab is located at the first door on the left as you exit the elevator.  We are referring you to Hematology for IV IRON  INFUSION.  They will contact you directly to schedule an appointment.  It may take a week or more before you hear from them.  Please feel free to contact us  if you have not heard from them within 2 weeks and we will follow up on the referral.    _______________________________________________________  If your blood pressure at your visit was 140/90 or greater, please contact your primary care physician to follow up on this.  _______________________________________________________  If you are age 68 or older, your body mass index should be between 23-30. Your Body mass index is 32.69 kg/m. If this is out of the aforementioned range listed, please consider follow up with your Primary Care Provider.  If you are age 68 or younger, your body mass index should be between 19-25. Your Body mass index is 32.69 kg/m. If this is out of the aformentioned range listed, please consider follow up with your Primary Care Provider.   ________________________________________________________  The [facility] providers would like to encourage you to use MYCHART to communicate with providers for non-urgent requests or questions.  Due to long hold times on the telephone, sending your provider a message by [facility] may be a faster and more efficient way to  get a response.  Please allow 48 business hours for a response.  Please remember that this is for non-urgent requests.  _______________________________________________________  [facility] is using a team-based approach to care.  Your team is made up of your doctor and two to three APPS. Our APPS (Nurse Practitioners and Physician Assistants) work with your physician to ensure care continuity for you. They are fully qualified to address your health concerns and develop a treatment plan. They communicate directly with your gastroenterologist to care for you. Seeing the Advanced Practice Practitioners on your physician's team can help you by facilitating care more promptly, often allowing for earlier appointments, access to diagnostic testing, procedures, and other specialty referrals.   Due to recent changes in healthcare laws, you may see the results of your imaging and laboratory studies on MyChart before your provider has had a chance to review them.  We understand that in some cases there may be results that are confusing or concerning to you. Not all laboratory results come back in the same time frame and the provider may be waiting for multiple results in order to interpret others.  Please give us  48 hours in order for your provider to thoroughly review all the results before contacting the office for clarification of your results.    FODMAP stands for fermentable oligo-, di-, mono-saccharides and polyols (1). These are the scientific terms used to classify groups of carbs that are difficult for our body to digest and that are notorious for triggering digestive symptoms like bloating,  gas, loose stools and stomach pain.   You can try low FODMAP diet  - start with eliminating just one column at a time that you feel may be a trigger for you. - the table at the very bottom contains foods that are low in FODMAPs   Sometimes trying to eliminate the FODMAP's from your diet is difficult or  tricky, if you are stuggling with trying to do the elimination diet you can try an enzyme.  There is a food enzymes that you sprinkle in or on your food that helps break down the FODMAP. You can read more about the enzyme by going to this site: https://fodzyme.com/

## 2024-07-12 ENCOUNTER — Other Ambulatory Visit (HOSPITAL_COMMUNITY): Payer: Self-pay

## 2024-07-12 ENCOUNTER — Other Ambulatory Visit

## 2024-07-12 ENCOUNTER — Telehealth: Payer: Self-pay

## 2024-07-12 DIAGNOSIS — K52832 Lymphocytic colitis: Secondary | ICD-10-CM

## 2024-07-12 DIAGNOSIS — J8283 Eosinophilic asthma: Secondary | ICD-10-CM

## 2024-07-12 DIAGNOSIS — D509 Iron deficiency anemia, unspecified: Secondary | ICD-10-CM

## 2024-07-12 NOTE — Telephone Encounter (Signed)
 Pharmacy Patient Advocate Encounter  Received notification from [employer] that Prior Authorization for repatha  has been APPROVED from 07/11/24 to 07/11/25   PA #/Case ID/Reference #: [id number]

## 2024-07-12 NOTE — Telephone Encounter (Signed)
 Submitted a Prior Authorization request to [facility] [employer] for FASENRA  via CoverMyMeds. Will update once we receive a response.  Key: [id number]

## 2024-07-12 NOTE — Telephone Encounter (Signed)
 Patient has been informed that if the Eliquis  is not d/c before her endoscopy that she has been cleared to hold Eliquis  48 hours prior to her procedure. Patient voiced understanding.

## 2024-07-12 NOTE — Telephone Encounter (Signed)
 Received notification from [employer] regarding a prior authorization for FASENRA . Authorization has been APPROVED from 07/12/2024 to 06/22/2025. Approval letter sent to scan center.  Authorization # [id number]  Sherry Pennant, PharmD, MPH, BCPS, CPP Clinical Pharmacist

## 2024-07-12 NOTE — Telephone Encounter (Signed)
 Request for surgical clearance:     Endoscopy Procedure  What type of surgery is being performed?     Colon/EGD    When is this surgery scheduled?     Eliquis    What type of clearance is required ?   Pharmacy  Are there any medications that need to be held prior to surgery and how long? Eliquis  x2 days prior to procedure. -Patient did indicate that Eliquis  maybe d/c in Feb 2026, please advise.   Practice name and name of physician performing surgery?      [facility]  What is your office phone and fax number?      Phone- [phone]  Fax- [phone]  Anesthesia type (None, local, MAC, general) ?       MAC  Please route your response to Blondie Barks, CMA

## 2024-07-14 ENCOUNTER — Encounter: Payer: Self-pay | Admitting: Nurse Practitioner

## 2024-07-14 LAB — GI PROFILE, STOOL, PCR

## 2024-07-16 ENCOUNTER — Ambulatory Visit: Payer: Self-pay | Admitting: Gastroenterology

## 2024-07-16 LAB — CLOSTRIDIUM DIFFICILE TOXIN B, QUALITATIVE, REAL-TIME PCR: Toxigenic C. Difficile by PCR: NOT DETECTED

## 2024-07-16 NOTE — Progress Notes (Signed)
 Addendum: Reviewed and agree with assessment and management plan. Okay for upper endoscopy and colonoscopy to evaluate IDA Agree with hematology for IV iron  Grazia Taffe, Gordy HERO, MD

## 2024-07-17 ENCOUNTER — Other Ambulatory Visit: Payer: Self-pay

## 2024-07-17 ENCOUNTER — Encounter: Payer: Self-pay | Admitting: Nurse Practitioner

## 2024-07-17 ENCOUNTER — Other Ambulatory Visit (HOSPITAL_COMMUNITY): Payer: Self-pay

## 2024-07-17 MED ORDER — FAMOTIDINE 40 MG PO TABS: 40.0000 mg | ORAL_TABLET | Freq: Every day | ORAL | 2 refills | Status: AC

## 2024-07-17 NOTE — Progress Notes (Signed)
 "   MyChart Video Visit    Virtual Visit via Video Note   This patient is at least at moderate risk for complications without adequate follow up. This format is felt to be most appropriate for this patient at this time. Physical exam was limited by quality of the video and audio technology used for the visit. Victoria [name], CMA was able to get the patient set up on a video visit.  Patient location: Home Patient and provider in visit Provider location: Office  I discussed the limitations of evaluation and management by telemedicine and the availability of in person appointments. The patient expressed understanding and agreed to proceed.  Visit Date: 07/18/2024  Today's healthcare provider: Harlene Horton, MD     Subjective:    Patient ID: Victoria [name], female    DOB: 02-Sep-1955, 69 y.o.   MRN: [id number]  Chief Complaint  Patient presents with   Medical Management of Chronic Issues    Patient presents today for a 6 month follow-up.    HPI Discussed the use of AI scribe software for clinical note transcription with the patient, who gave verbal consent to proceed.  History of Present Illness Victoria [name] is a 69 year old female with gastrointestinal issues and anemia who presents with worsening diarrhea and insomnia.  She has a history of gastrointestinal issues, previously managed with Imodium, which allowed for one bowel movement in the morning. After discontinuing Imodium to start budesonide , her symptoms worsened. She has been on budesonide  for a few days, taking it twice daily instead of the prescribed three times due to forgetting a dose.  She experiences difficulty sleeping, taking three hours to fall asleep last night, which she attributes to the budesonide . She is on doxepin , which usually helps her sleep within half an hour for about ten hours, but this has been disrupted by the budesonide .  She experiences breakthrough depression and anxiety, which she associates with the  dreariness of the day. She mentions having heard of light therapy for seasonal affective disorder but has not tried it.  She is dealing with anemia, with her iron  levels at 7.8. She is scheduled to start five iron  infusion therapies next week. This is her second round of iron  infusions, having had them last winter. She is unsure of the cause of her anemia, mentioning a hiatal hernia, bowel issues, and her use of the blood thinner Eliquis  as potential factors. No blood in stool.  She is on a beta blocker for anxiety and reports that it makes her blood pressure lower. She does not feel dizzy or weak and is aware of the need to stay hydrated. No recent chest pain, palpitations, or breathing trouble.    Past Medical History:  Diagnosis Date   Allergic bronchopulmonary aspergillosis (HCC) 2008   sees Dr Katheren Finn pulmonology   Anemia    iron  deficiency, resolved   Anxiety    Asthma    Breast cancer (HCC)    CAD (coronary artery disease)    a. LHC 6/16:  oOM1 60, pRCA 25 >> med Rx b. cath 3/19 2nd OM with 95% stenosis s/p synergy DES & anomalous RCA   CAP (community acquired pneumonia) 2016; 06/07/2016   Chronic bronchitis (HCC)    Chronic lower back pain    Clotting disorder 07/04/2020   maroon blood in stools - 3 episodes in two hours   Complication of anesthesia    think I have a hard time waking up from it  COPD (chronic obstructive pulmonary disease) (HCC)    Depression    mild   Diverticulitis    Diverticulosis    GERD (gastroesophageal reflux disease)    H/O hiatal hernia    Headache    weekly (08/23/2017)   History of echocardiogram    Echo 6/16:  Mod LVH, EF 60-65%, no RWMA, Gr 1 DD, trivial MR, normal LA size.   History of radiation therapy    Left breast- 12/26/22-01/23/23-Dr. Lynwood Nasuti   Hyperglycemia 11/20/2015   Hyperlipidemia, mixed 09/11/2007   Qualifier: Diagnosis of  By: Antonio ROSALEA Rockers   Did not tolerate Lipitor, zocor, Lovastatin, Pravastatin,  Livalo , Crestor  even low dose    IBS (irritable bowel syndrome)    Maxillary sinusitis    Normal cardiac stress test 11/2011   No evidence of ischemia or infarct.   Calculated ejection fraction 72%.   Obesity    OSA (obstructive sleep apnea) 02/2012   has stopped using  cpap   Osteoarthritis    Osteoporosis    Personal history of radiation therapy    Pneumonia 11/2011   before 2013 I hadn't had pneumonia since I was a child (04/13/2012)   Pulmonary nodules    S/P angioplasty with stent 08/23/17 ostial 2nd OM with DES synnergy 08/24/2017   Schatzki's ring     Past Surgical History:  Procedure Laterality Date   ANTERIOR AND POSTERIOR REPAIR N/A 10/18/2022   Procedure: ANTERIOR (CYSTOCELE);  Surgeon: Mat Browning, MD;  Location[facility];  Service: Gynecology;  Laterality: N/A;   APPENDECTOMY  1989   BREAST BIOPSY  11/03/2022   MM LT RADIOACTIVE SEED LOC MAMMO GUIDE 11/03/2022 [facility] MAMMOGRAPHY   BREAST LUMPECTOMY WITH RADIOACTIVE SEED LOCALIZATION Left 11/04/2022   Procedure: LEFT BREAST LUMPECTOMY WITH RADIOACTIVE SEED LOCALIZATION;  Surgeon: Curvin Deward MOULD, MD;  Location: [facility];  Service: General;  Laterality: Left;   CARDIAC CATHETERIZATION N/A 11/25/2014   Procedure: Right/Left Heart Cath and Coronary Angiography;  Surgeon: Victory LELON Sharps, MD;  Location: [facility] INVASIVE CV LAB;  Service: Cardiovascular;  Laterality: N/A;   CESAREAN SECTION  1985   COLONOSCOPY  03/2022   CORONARY ANGIOPLASTY WITH STENT PLACEMENT  08/23/2017   CORONARY STENT INTERVENTION N/A 08/23/2017   Procedure: CORONARY STENT INTERVENTION;  Surgeon: Verlin Lonni BIRCH, MD;  Location: [facility] INVASIVE CV LAB;  Service: Cardiovascular;  Laterality: N/A;   HERNIA REPAIR  04/13/2012   VHR laparoscopic   LAPAROSCOPIC BILATERAL SALPINGO OOPHERECTOMY Bilateral 10/18/2022   Procedure: LAPAROTOMY WITH BILATERAL SALPINGO OOPHORECTOMY;  Surgeon: Mat Browning, MD;  Location: [facility] OR;  Service: Gynecology;   Laterality: Bilateral;   LEFT HEART CATH AND CORONARY ANGIOGRAPHY N/A 08/23/2017   Procedure: LEFT HEART CATH AND CORONARY ANGIOGRAPHY;  Surgeon: Verlin Lonni BIRCH, MD;  Location: [facility] INVASIVE CV LAB;  Service: Cardiovascular;  Laterality: N/A;   VAGINAL HYSTERECTOMY N/A 10/18/2022   Procedure: HYSTERECTOMY VAGINAL;  Surgeon: Mat Browning, MD;  Location: [facility] OR;  Service: Gynecology;  Laterality: N/A;   VENTRAL HERNIA REPAIR  04/13/2012   Procedure: LAPAROSCOPIC VENTRAL HERNIA;  Surgeon: Elon CHRISTELLA Pacini, MD;  Location: [facility] OR;  Service: General;  Laterality: N/A;  laparoscopic repair of incarcerated hernia    Family History  Problem Relation Age of Onset   Breast cancer Mother 69       bilateral breast cancer dx. 78, met to liver   Hypertension Mother    Diabetes Mother    Cancer Mother    Diverticulosis Father  Prostate cancer Father    Breast cancer Sister 43       DCIS at 5, IDC at 36, PALB2+   Cancer Sister        breast cancer, invasive ductal carcinoma in 2022,DCIS at 63 with 4 weeks of radiation, 5 years of Tamoxifen     Depression Sister    Hyperlipidemia Sister    Pulmonary embolism Brother        recurrent   Heart attack Maternal Grandfather    Ovarian cancer Paternal Grandmother    Cerebral palsy Son    Prostate cancer Paternal Uncle    Breast cancer Niece 92       PALB2+   Osteoporosis Niece    Stroke Neg Hx    Colon cancer Neg Hx    Esophageal cancer Neg Hx    Stomach cancer Neg Hx    Rectal cancer Neg Hx     Social History   Socioeconomic History   Marital status: Married    Spouse name: Not on file   Number of children: 1   Years of education: 14   Highest education level: Associate degree: academic program  Occupational History   Occupation: Disabled   Tobacco Use   Smoking status: Never    Passive exposure: Never   Smokeless tobacco: Never  Vaping Use   Vaping status: Never Used  Substance and Sexual Activity   Alcohol use: Yes     Alcohol/week: 4.0 - 7.0 standard drinks of alcohol    Types: 4 - 7 Standard drinks or equivalent per week   Drug use: No   Sexual activity: Yes    Birth control/protection: Surgical    Comment: gluten free, lives with husband and son with CP quadriplegia  Other Topics Concern   Not on file  Social History Narrative   Cares for son with cerebral palsy.    Lives at home with her husband and son.   Right-handed.   2 cups caffeine per day.   One story home   Social Drivers of Health   Tobacco Use: Low Risk (07/14/2024)   Patient History    Smoking Tobacco Use: Never    Smokeless Tobacco Use: Never    Passive Exposure: Never  Financial Resource Strain: Low Risk (01/18/2023)   Overall Financial Resource Strain (CARDIA)    Difficulty of Paying Living Expenses: Not hard at all  Food Insecurity: No Food Insecurity (02/13/2024)   Epic    Worried About Radiation Protection Practitioner of Food in the Last Year: Never true    Ran Out of Food in the Last Year: Never true  Transportation Needs: No Transportation Needs (02/13/2024)   Epic    Lack of Transportation (Medical): No    Lack of Transportation (Non-Medical): No  Physical Activity: Inactive (02/13/2024)   Exercise Vital Sign    Days of Exercise per Week: 0 days    Minutes of Exercise per Session: 0 min  Stress: No Stress Concern Present (02/13/2024)   [employer] of Occupational Health - Occupational Stress Questionnaire    Feeling of Stress: Only a little  Social Connections: Socially Integrated (02/13/2024)   Social Connection and Isolation Panel    Frequency of Communication with Friends and Family: More than three times a week    Frequency of Social Gatherings with Friends and Family: Twice a week    Attends Religious Services: More than 4 times per year    Active Member of [employer] or Organizations: Yes    Attends [employer]  or Organization Meetings: More than 4 times per year    Marital Status: Married  Catering Manager Violence: Not At Risk  (02/13/2024)   Epic    Fear of Current or Ex-Partner: No    Emotionally Abused: No    Physically Abused: No    Sexually Abused: No  Depression (PHQ2-9): Low Risk (07/18/2024)   Depression (PHQ2-9)    PHQ-2 Score: 0  Alcohol Screen: Low Risk (02/13/2024)   Alcohol Screen    Last Alcohol Screening Score (AUDIT): 0  Housing: Unknown (02/13/2024)   Epic    Unable to Pay for Housing in the Last Year: No    Number of Times Moved in the Last Year: Not on file    Homeless in the Last Year: No  Utilities: Not At Risk (02/13/2024)   Epic    Threatened with loss of utilities: No  Health Literacy: Adequate Health Literacy (02/13/2024)   B1300 Health Literacy    Frequency of need for help with medical instructions: Never    Outpatient Medications Prior to Visit  Medication Sig Dispense Refill   acetaminophen  (TYLENOL ) 650 MG CR tablet Take 650-1,300 mg by mouth every 8 (eight) hours as needed for pain.     albuterol  (VENTOLIN  HFA) 108 (90 Base) MCG/ACT inhaler Inhale 1-2 puffs into the lungs every 6 (six) hours as needed. 8 g 2   ascorbic acid (VITAMIN C) 500 MG tablet Take 500 mg by mouth every evening.     benralizumab  (FASENRA  PEN) 30 MG/ML prefilled autoinjector Inject 1 mL (30 mg total) into the skin every 8 (eight) weeks. 1 mL 2   budesonide  (ENTOCORT EC ) 3 MG 24 hr capsule Take 3 capsules (9 mg total) by mouth daily for 56 days, THEN 2 capsules (6 mg total) daily for 28 days, THEN 1 capsule (3 mg total) daily for 28 days. 252 capsule 0   Calcium -Magnesium -Vitamin D  (CALCIUM  MAGNESIUM  PO) Take 1 tablet by mouth at bedtime.     clonazePAM  (KLONOPIN ) 0.5 MG tablet Take 1 tablet (0.5 mg total) by mouth in the morning. 30 tablet 1   denosumab -bbdz (JUBBONTI ) 60 MG/ML SOSY injection Inject 60 mg into the skin every 6 (six) months. Dx code: M81.0.  pt has appt on 06/18/24 1 mL 0   doxepin  (SINEQUAN ) 25 MG capsule TAKE 1 CAPSULE (25 MG TOTAL) BY MOUTH AT BEDTIME. 30 capsule 2   ELIQUIS  5 MG TABS  tablet TAKE 1 TABLET BY MOUTH 2 TIMES DAILY. 60 tablet 4   famotidine  (PEPCID ) 40 MG tablet Take 1 tablet (40 mg total) by mouth daily. 30 tablet 2   ipratropium-albuterol  (DUONEB) 0.5-2.5 (3) MG/3ML SOLN Take 3 mLs by nebulization every 6 (six) hours as needed. 120 mL 5   Na Sulfate-K Sulfate-Mg Sulfate concentrate (SUPREP BOWEL PREP  KIT) 17.5-3.13-1.6 GM/177ML SOLN Take 1 kit (354 mLs total) by mouth as directed. For colonoscopy prep 354 mL 0   nitroGLYCERIN  (NITROSTAT ) 0.4 MG SL tablet Place 1 tablet (0.4 mg total) under the tongue every 5 (five) minutes as needed for chest pain. 25 tablet 11   pantoprazole  (PROTONIX ) 40 MG tablet Take 1 tablet (40 mg total) by mouth 2 (two) times daily. 60 tablet 3   pindolol  (VISKEN ) 5 MG tablet Take 1 tablet (5 mg total) by mouth daily after breakfast. 30 tablet 2   REPATHA  SURECLICK 140 MG/ML SOAJ INJECT 140 MG INTO THE SKIN EVERY 14 DAYS. 2 mL 11   risperiDONE  (RISPERDAL ) 0.5 MG tablet Take 0.5  tablets (0.25 mg total) by mouth at bedtime. 30 tablet 2   valbenazine  (INGREZZA ) 60 MG capsule Take 1 capsule (60 mg total) by mouth daily. 30 capsule 2   venlafaxine  XR (EFFEXOR -XR) 75 MG 24 hr capsule Take 3 capsules (225 mg total) by mouth daily with breakfast. 90 capsule 2   Facility-Administered Medications Prior to Visit  Medication Dose Route Frequency Provider Last Rate Last Admin   [START ON 12/12/2024] denosumab -bbdz (JUBBONTI ) injection 60 mg  60 mg Subcutaneous Once Drury Ardizzone A, MD        Allergies[1]  Review of Systems  Constitutional:  Negative for malaise/fatigue.  Eyes:  Negative for blurred vision.  Respiratory:  Negative for shortness of breath.   Cardiovascular:  Negative for chest pain and palpitations.  Gastrointestinal:  Positive for diarrhea. Negative for abdominal pain, blood in stool and nausea.  Genitourinary:  Negative for dysuria and frequency.  Musculoskeletal:  Negative for falls.  Neurological:  Negative for dizziness, loss  of consciousness and headaches.  Endo/Heme/Allergies:  Negative for environmental allergies.  Psychiatric/Behavioral:  Negative for depression. The patient has insomnia. The patient is not nervous/anxious.        Objective:    Physical Exam Constitutional:      General: She is not in acute distress.    Appearance: Normal appearance. She is not ill-appearing or toxic-appearing.  HENT:     Head: Normocephalic and atraumatic.     Right Ear: External ear normal.     Left Ear: External ear normal.     Nose: Nose normal.  Eyes:     General:        Right eye: No discharge.        Left eye: No discharge.  Pulmonary:     Effort: Pulmonary effort is normal.  Skin:    Findings: No rash.  Neurological:     Mental Status: She is alert and oriented to person, place, and time.  Psychiatric:        Behavior: Behavior normal.     BP (!) 103/53   Pulse 83   SpO2 97%  Wt Readings from Last 3 Encounters:  07/11/24 167 lb 6 oz (75.9 kg)  07/10/24 168 lb 12.8 oz (76.6 kg)  04/29/24 165 lb 12.8 oz (75.2 kg)       Assessment & Plan:  Statin myopathy Assessment & Plan: Unable to tolerate statins  Orders: -     Comprehensive metabolic panel with GFR; Future  Chronic diastolic CHF (congestive heart failure), NYHA class 2 (HCC) Assessment & Plan: Asymptomatic, continue to monitor and manage medically  Orders: -     CBC with Differential/Platelet; Future -     TSH; Future  Hyperglycemia Assessment & Plan: hgba1c acceptable, minimize simple carbs. Increase exercise as tolerated.  Orders: -     Hemoglobin A1c; Future  Hyperlipidemia, mixed Assessment & Plan: Encourage heart healthy diet such as MIND or DASH diet, increase exercise, avoid trans fats, simple carbohydrates and processed foods, consider a krill or fish or flaxseed oil cap daily.   Orders: -     Lipid panel; Future  Iron  deficiency anemia due to chronic blood loss -     CBC with Differential/Platelet;  Future  Age-related osteoporosis without current pathological fracture Assessment & Plan: Encouraged to get adequate exercise, calcium  and vitamin d  intake   Orders: -     VITAMIN D  25 Hydroxy (Vit-D Deficiency, Fractures); Future  Severe persistent asthma with intensive monitoring [facility]) Assessment &  Plan: Patient without any recent exacerbation. Doing well on current meds, follows with pulmonology   Diabetes due to undrl condition w oth diabetic neuro comp (HCC)  Severe episode of recurrent major depressive disorder, without psychotic features [facility]) Assessment & Plan: Patient doing well with psychiatry and on current meds.       Assessment and Plan Assessment & Plan Iron  deficiency anemia Recurrent iron  deficiency anemia with hemoglobin at 7.8 g/dL, likely multifactorial due to hiatal hernia, gastrointestinal issues, and Eliquis  use. No overt signs of gastrointestinal bleeding. - Will initiate iron  infusion therapy next Wednesday.  Chronic diastolic heart failure, NYHA class II Well-managed with no recent exacerbations or new symptoms such as chest pain or palpitations. Blood pressure slightly low, likely due to beta blocker use for anxiety management. - Ensure adequate hydration with 5-10 ounces of water every 1-2 hours. - Consider increasing dietary salt intake or using an electrolyte beverage like Scratch to maintain blood pressure.  Depression and anxiety Breakthrough depression and anxiety, possibly exacerbated by winter and anemia. Current management includes doxepin  and beta blocker for anxiety. Light therapy discussed as a potential adjunct for seasonal affective disorder. - Consider light therapy using a Verilux device for seasonal affective disorder. - Continue current medications for depression and anxiety.  Diverticulitis Recent exacerbation of gastrointestinal symptoms, including diarrhea. Previously managed with Imodium, now on budesonide  with limited effect.  Scheduled for colonoscopy in March. - Continue budesonide  as prescribed. - Follow up with GI specialist in March for colonoscopy.  General health maintenance Routine health maintenance discussed, including potential blood work and transition of care to a new provider. - Scheduled follow-up appointment in three months with Dr. Dameron. - Will perform blood work at next visit, including thyroid  and A1c.  Recording duration: 24 minutes     I discussed the assessment and treatment plan with the patient. The patient was provided an opportunity to ask questions and all were answered. The patient agreed with the plan and demonstrated an understanding of the instructions.   The patient was advised to call back or seek an in-person evaluation if the symptoms worsen or if the condition fails to improve as anticipated.  Harlene Horton, MD [facility] [phone] (phone) [phone] (fax)  [facility]      [1]  Allergies Allergen Reactions   Latuda  [Lurasidone  Hcl] Other (See Comments)    PER THE PT, CAUSED RESTLESSNESS   Beclomethasone Dipropionate Hives and Other (See Comments)    Weight gain, also   Flexeril  [Cyclobenzaprine ] Anxiety   Mometasone  Furo-Formoterol Fum Hives and Other (See Comments)    Weight gain, also   Sulfonamide Derivatives Hives and Rash   Other    Statins Other (See Comments)    Myalgias, RLS   Sulfamethoxazole-Trimethoprim     Other Reaction(s): Not available   Tamoxifen  Other (See Comments)    Other Reaction(s): Not available  tamoxifen    Fluticasone  Rash and Other (See Comments)    Weight gain, too   "

## 2024-07-17 NOTE — Progress Notes (Signed)
 Specialty Pharmacy Initial Fill Coordination Note  Victoria [name] is a 69 y.o. female contacted today regarding initial fill of specialty medication(s) Benralizumab  (Fasenra  Pen)   Patient requested Delivery   Delivery date: 07/19/24   Verified address: [address], [place], [place] [address]   Medication will be filled on: 07/18/24   Patient is aware of $0 copayment.

## 2024-07-17 NOTE — Assessment & Plan Note (Signed)
 Asymptomatic, continue to monitor and manage medically

## 2024-07-17 NOTE — Assessment & Plan Note (Signed)
 Unable to tolerate statins

## 2024-07-18 ENCOUNTER — Telehealth: Payer: Self-pay | Admitting: *Deleted

## 2024-07-18 ENCOUNTER — Encounter: Payer: Self-pay | Admitting: Family Medicine

## 2024-07-18 ENCOUNTER — Ambulatory Visit: Admitting: Family Medicine

## 2024-07-18 ENCOUNTER — Telehealth: Admitting: Family Medicine

## 2024-07-18 VITALS — BP 103/53 | HR 83

## 2024-07-18 DIAGNOSIS — M810 Age-related osteoporosis without current pathological fracture: Secondary | ICD-10-CM

## 2024-07-18 DIAGNOSIS — F332 Major depressive disorder, recurrent severe without psychotic features: Secondary | ICD-10-CM | POA: Diagnosis not present

## 2024-07-18 DIAGNOSIS — E782 Mixed hyperlipidemia: Secondary | ICD-10-CM

## 2024-07-18 DIAGNOSIS — G720 Drug-induced myopathy: Secondary | ICD-10-CM | POA: Diagnosis not present

## 2024-07-18 DIAGNOSIS — E0849 Diabetes mellitus due to underlying condition with other diabetic neurological complication: Secondary | ICD-10-CM

## 2024-07-18 DIAGNOSIS — J4550 Severe persistent asthma, uncomplicated: Secondary | ICD-10-CM | POA: Diagnosis not present

## 2024-07-18 DIAGNOSIS — I5032 Chronic diastolic (congestive) heart failure: Secondary | ICD-10-CM | POA: Diagnosis not present

## 2024-07-18 DIAGNOSIS — T466X5A Adverse effect of antihyperlipidemic and antiarteriosclerotic drugs, initial encounter: Secondary | ICD-10-CM

## 2024-07-18 DIAGNOSIS — R739 Hyperglycemia, unspecified: Secondary | ICD-10-CM | POA: Diagnosis not present

## 2024-07-18 DIAGNOSIS — D500 Iron deficiency anemia secondary to blood loss (chronic): Secondary | ICD-10-CM

## 2024-07-18 NOTE — Telephone Encounter (Signed)
 Already spoke with pt

## 2024-07-18 NOTE — Telephone Encounter (Signed)
 Copied from CRM #[id number]. Topic: General - Call Back - No Documentation >> Jul 18, 2024  3:11 PM Carlyon D wrote: Reason for CRM: Pt calling to speak to miss Porsha who called pt. Called CAL stated miss Cherise is busy at the moment.  Please give pt a call when available.

## 2024-07-19 DIAGNOSIS — J4550 Severe persistent asthma, uncomplicated: Secondary | ICD-10-CM | POA: Insufficient documentation

## 2024-07-19 NOTE — Assessment & Plan Note (Signed)
 hgba1c acceptable, minimize simple carbs. Increase exercise as tolerated.

## 2024-07-19 NOTE — Assessment & Plan Note (Signed)
 Patient without any recent exacerbation. Doing well on current meds, follows with pulmonology

## 2024-07-19 NOTE — Assessment & Plan Note (Signed)
 Encouraged to get adequate exercise, calcium and vitamin d intake

## 2024-07-19 NOTE — Assessment & Plan Note (Signed)
 Encourage heart healthy diet such as MIND or DASH diet, increase exercise, avoid trans fats, simple carbohydrates and processed foods, consider a krill or fish or flaxseed oil cap daily.

## 2024-07-19 NOTE — Assessment & Plan Note (Signed)
 Patient doing well with psychiatry and on current meds.

## 2024-07-22 ENCOUNTER — Encounter: Payer: Self-pay | Admitting: Cardiology

## 2024-07-22 ENCOUNTER — Other Ambulatory Visit: Payer: Self-pay | Admitting: Family

## 2024-07-22 ENCOUNTER — Encounter (HOSPITAL_COMMUNITY): Payer: Self-pay

## 2024-07-22 DIAGNOSIS — G2401 Drug induced subacute dyskinesia: Secondary | ICD-10-CM

## 2024-07-22 DIAGNOSIS — M419 Scoliosis, unspecified: Secondary | ICD-10-CM

## 2024-07-22 MED ORDER — REPATHA SURECLICK 140 MG/ML ~~LOC~~ SOAJ: 140.0000 mg | SUBCUTANEOUS | 11 refills | Status: AC

## 2024-07-22 MED ORDER — VALBENAZINE TOSYLATE 60 MG PO CAPS: 60.0000 mg | ORAL_CAPSULE | Freq: Every day | ORAL | 2 refills | Status: DC

## 2024-07-23 MED ORDER — VALBENAZINE TOSYLATE 60 MG PO CAPS: 60.0000 mg | ORAL_CAPSULE | Freq: Every day | ORAL | 2 refills | Status: AC

## 2024-07-23 NOTE — Addendum Note (Signed)
 Addended by: CARVIN CROCK on: 07/23/2024 04:40 PM   Modules accepted: Orders

## 2024-07-23 NOTE — Progress Notes (Signed)
 See pharmacotherapy visit note 06/26/24.   Patient continues Fasenra  for asthma. Previously filled Fasenra  through medication assistance in 2025, but filling on grant through [facility] in 2026.   Plan: - CONTINUE Fasenra  30mg  Hansen every 8 weeks  - Rx for Fasenra  triaged to [facility]  - CONTINUE remaining respiratory medications as prescribed. - Follow-up with Dr. Geronimo as planned on 07/30/24  Victoria [name], PharmD, BCPS, CPP Clinical Pharmacist  [facility]  [facility]

## 2024-07-24 ENCOUNTER — Encounter: Payer: Self-pay | Admitting: Nurse Practitioner

## 2024-07-24 ENCOUNTER — Ambulatory Visit (INDEPENDENT_AMBULATORY_CARE_PROVIDER_SITE_OTHER): Admitting: *Deleted

## 2024-07-24 VITALS — BP 112/71 | HR 79 | Temp 98.3°F | Resp 16 | Ht 60.0 in | Wt 166.2 lb

## 2024-07-24 DIAGNOSIS — Z853 Personal history of malignant neoplasm of breast: Secondary | ICD-10-CM

## 2024-07-24 DIAGNOSIS — D500 Iron deficiency anemia secondary to blood loss (chronic): Secondary | ICD-10-CM | POA: Diagnosis not present

## 2024-07-24 MED ORDER — DIPHENHYDRAMINE HCL 25 MG PO CAPS: 25.0000 mg | ORAL_CAPSULE | Freq: Once | ORAL | Status: DC

## 2024-07-24 MED ORDER — IRON SUCROSE 200 MG IVPB - SIMPLE MED
200.0000 mg | Freq: Once | Status: AC
  Administered 2024-07-24: 200 mg via INTRAVENOUS

## 2024-07-24 MED ORDER — ACETAMINOPHEN 325 MG PO TABS: 650.0000 mg | ORAL_TABLET | Freq: Once | ORAL | Status: DC

## 2024-07-24 NOTE — Progress Notes (Signed)
 Diagnosis: Iron  Deficiency Anemia  Provider:  Praveen Mannam MD  Procedure: IV Infusion  IV Type: Peripheral, IV Location: L Antecubital   Venofer  (Iron  Sucrose), Dose: 200 mg  Infusion Start Time: 1129  Infusion Stop Time: 1150  Post Infusion IV Care: Observation period completed and Peripheral IV Discontinued  Discharge: Condition: Good, Destination: Home . AVS Provided  Performed by:  Leita FORBES Miles, LPN

## 2024-07-30 ENCOUNTER — Ambulatory Visit: Admitting: Internal Medicine

## 2024-07-31 ENCOUNTER — Ambulatory Visit

## 2024-08-06 ENCOUNTER — Telehealth (HOSPITAL_COMMUNITY): Admitting: Psychiatry

## 2024-08-08 ENCOUNTER — Ambulatory Visit

## 2024-09-06 ENCOUNTER — Encounter: Admitting: Internal Medicine

## 2024-09-10 ENCOUNTER — Ambulatory Visit: Admitting: Endocrinology

## 2024-09-23 ENCOUNTER — Other Ambulatory Visit

## 2024-10-15 ENCOUNTER — Encounter: Admitting: Student

## 2025-01-08 ENCOUNTER — Inpatient Hospital Stay

## 2025-01-08 ENCOUNTER — Inpatient Hospital Stay: Admitting: Nurse Practitioner

## 2025-02-25 ENCOUNTER — Ambulatory Visit
